# Patient Record
Sex: Female | Born: 1945
Health system: Southern US, Community
[De-identification: ages and names within clinical notes are randomized; demographics above are authoritative.]

## PROBLEM LIST (undated history)

## (undated) DIAGNOSIS — N3281 Overactive bladder: Secondary | ICD-10-CM

## (undated) DIAGNOSIS — I10 Essential (primary) hypertension: Secondary | ICD-10-CM

## (undated) DIAGNOSIS — E039 Hypothyroidism, unspecified: Secondary | ICD-10-CM

## (undated) DIAGNOSIS — D649 Anemia, unspecified: Secondary | ICD-10-CM

## (undated) DIAGNOSIS — F32A Depression, unspecified: Secondary | ICD-10-CM

## (undated) DIAGNOSIS — Z992 Dependence on renal dialysis: Secondary | ICD-10-CM

## (undated) DIAGNOSIS — C801 Malignant (primary) neoplasm, unspecified: Secondary | ICD-10-CM

## (undated) DIAGNOSIS — Z87898 Personal history of other specified conditions: Secondary | ICD-10-CM

## (undated) DIAGNOSIS — N189 Chronic kidney disease, unspecified: Secondary | ICD-10-CM

## (undated) DIAGNOSIS — E7800 Pure hypercholesterolemia, unspecified: Secondary | ICD-10-CM

## (undated) DIAGNOSIS — F329 Major depressive disorder, single episode, unspecified: Secondary | ICD-10-CM

## (undated) DIAGNOSIS — I6930 Unspecified sequelae of cerebral infarction: Secondary | ICD-10-CM

## (undated) DIAGNOSIS — N186 End stage renal disease: Secondary | ICD-10-CM

## (undated) DIAGNOSIS — K219 Gastro-esophageal reflux disease without esophagitis: Secondary | ICD-10-CM

## (undated) DIAGNOSIS — G8190 Hemiplegia, unspecified affecting unspecified side: Secondary | ICD-10-CM

## (undated) DIAGNOSIS — I639 Cerebral infarction, unspecified: Secondary | ICD-10-CM

## (undated) DIAGNOSIS — H409 Unspecified glaucoma: Secondary | ICD-10-CM

## (undated) DIAGNOSIS — E119 Type 2 diabetes mellitus without complications: Secondary | ICD-10-CM

## (undated) HISTORY — PX: PICC LINE INSERTION: CATH118290

## (undated) HISTORY — DX: Chronic kidney disease, unspecified: N18.9

## (undated) HISTORY — PX: BREAST LUMPECTOMY: SHX2

## (undated) HISTORY — PX: CATARACT EXTRACTION W/ INTRAOCULAR LENS IMPLANT: SHX1309

## (undated) HISTORY — PX: ABDOMINAL HYSTERECTOMY: SHX81

---
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
## 1990-12-06 DIAGNOSIS — I639 Cerebral infarction, unspecified: Secondary | ICD-10-CM

## 1990-12-06 HISTORY — DX: Cerebral infarction, unspecified: I63.9

## 1996-12-06 HISTORY — PX: CEREBRAL ANEURYSM REPAIR: SHX164

## 2012-12-06 HISTORY — PX: BREAST EXCISIONAL BIOPSY: SUR124

## 2015-06-27 IMAGING — US US EXTREM LOW VENOUS*L*
1 series · 13 of 24 positions shown · non-contrast
Comparison: None.

CLINICAL DATA: Chronic left lower extremity edema.



[Series 1: us extrem low venous*left* · 0.08mm/px · 13 of 33 slices shown]
[im 1/33]
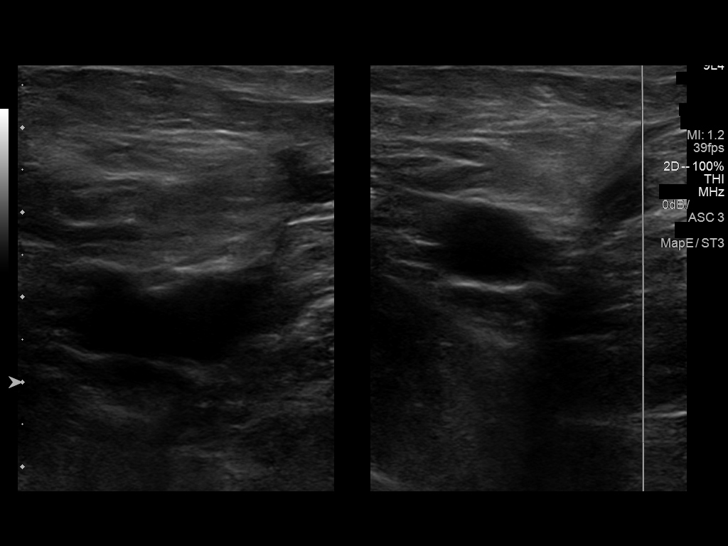
[im 3/33]
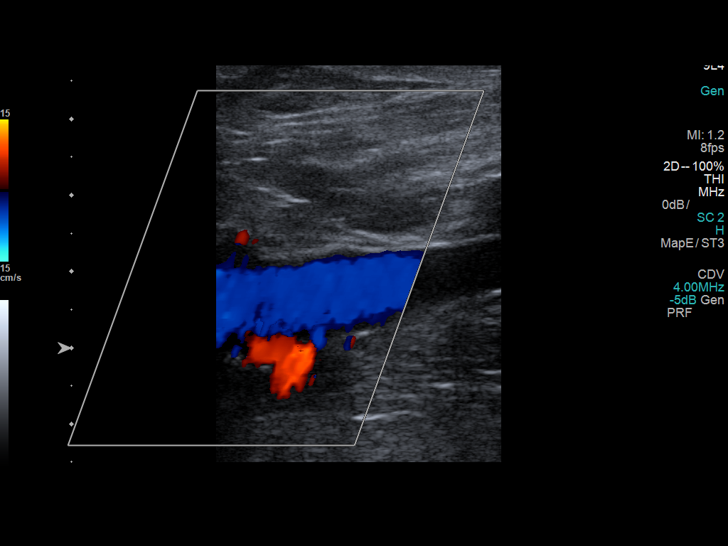
[im 6/33]
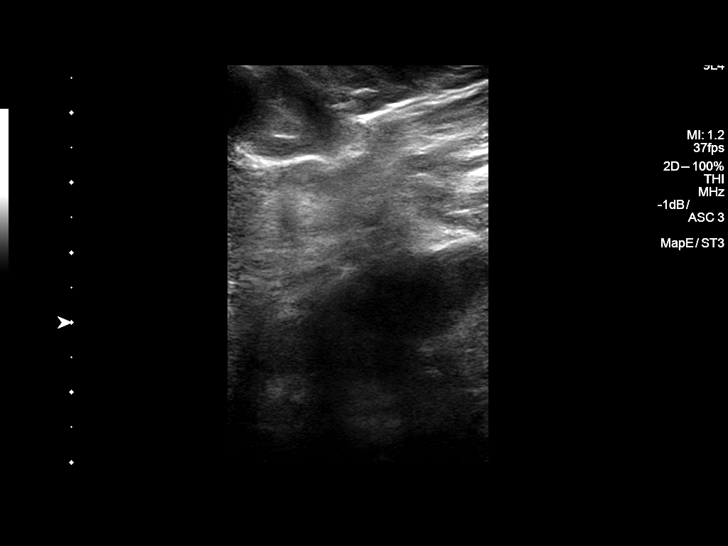
[im 9/33]
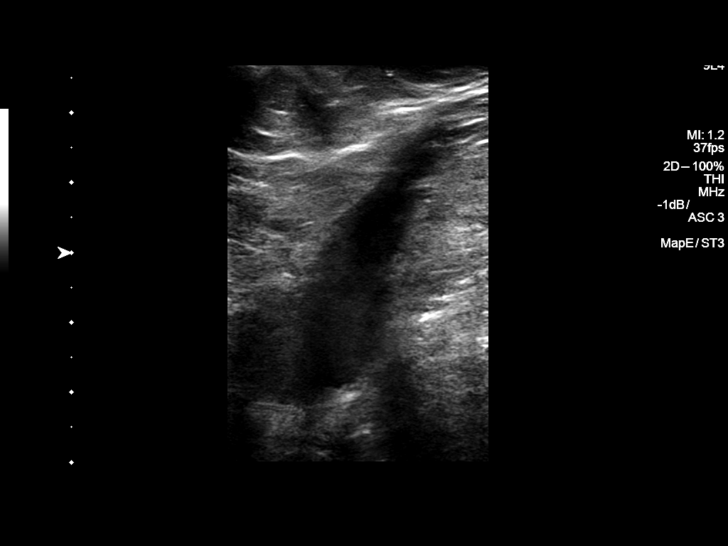
[im 12/33]
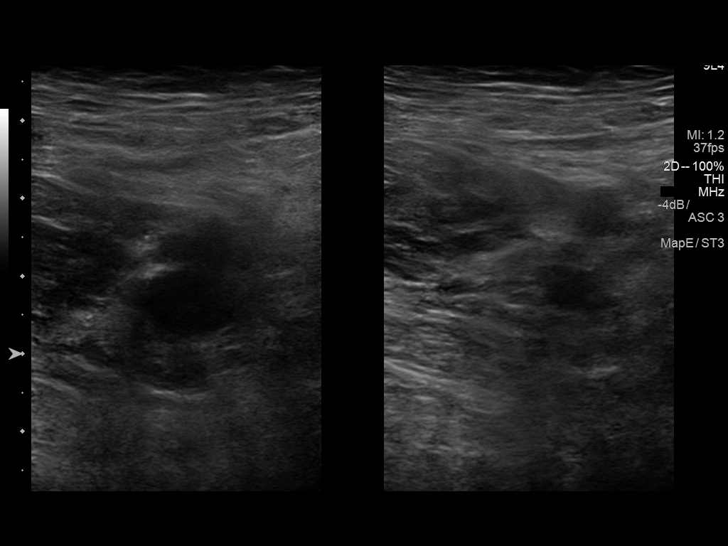
[im 14/33]
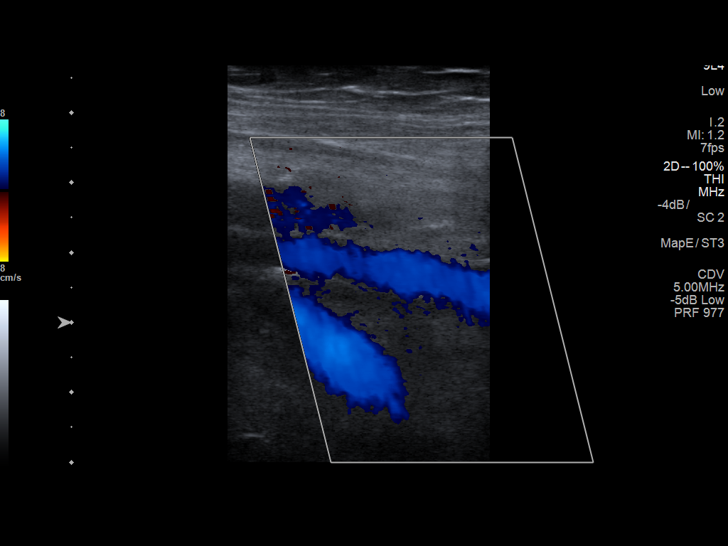
[im 17/33]
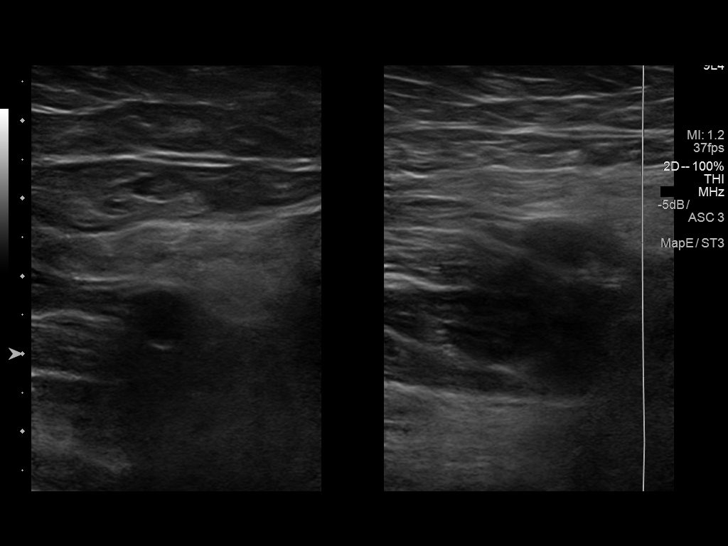
[im 19/33]
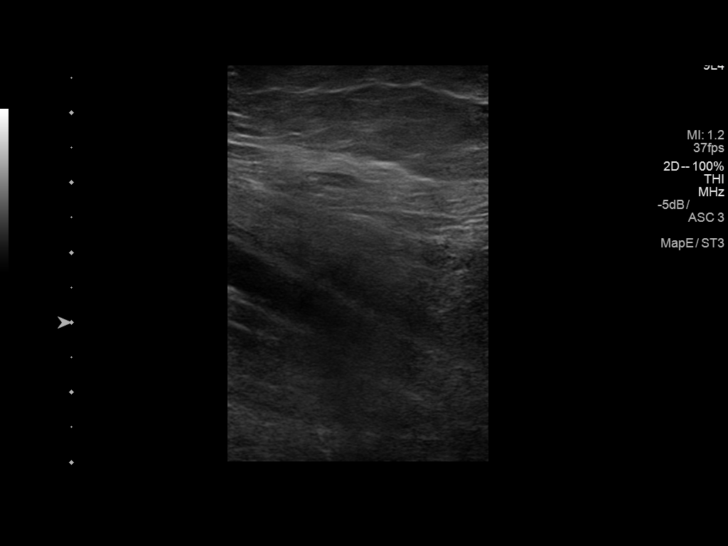
[im 21/33]
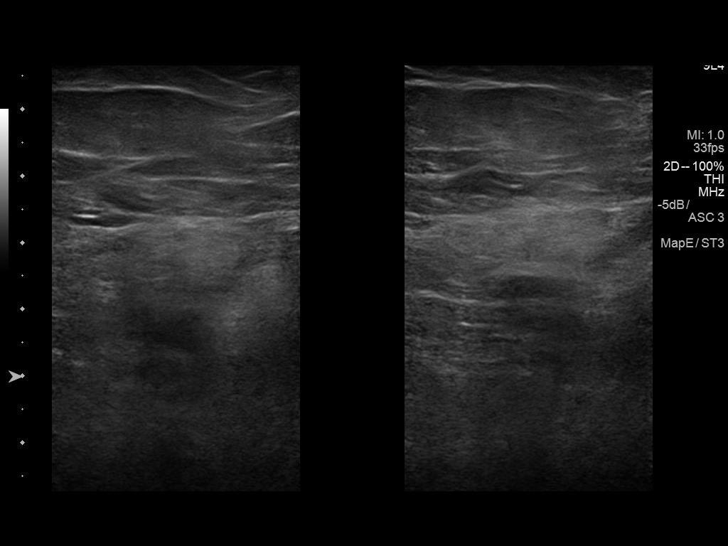
[im 24/33]
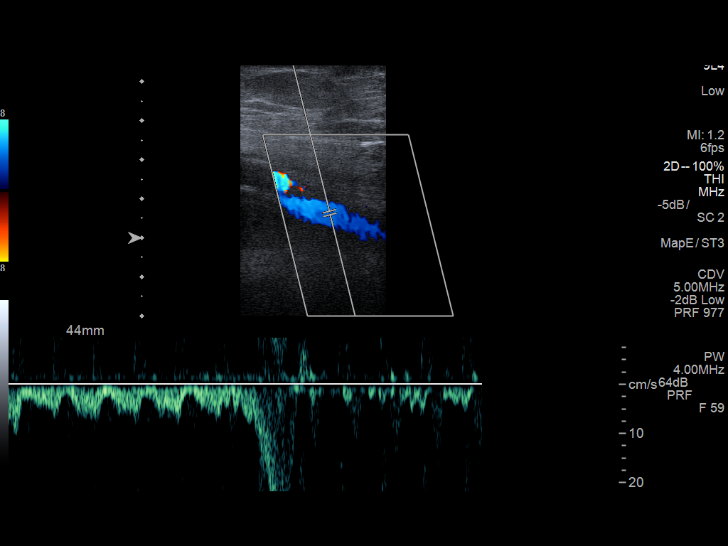
[im 27/33]
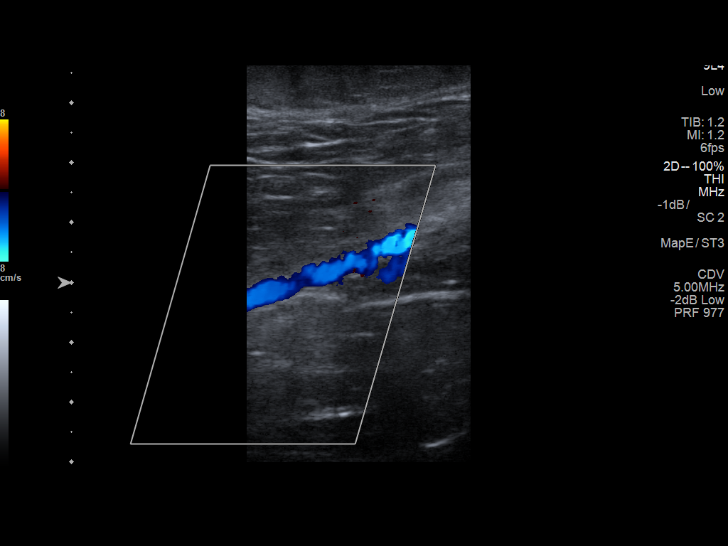
[im 30/33]
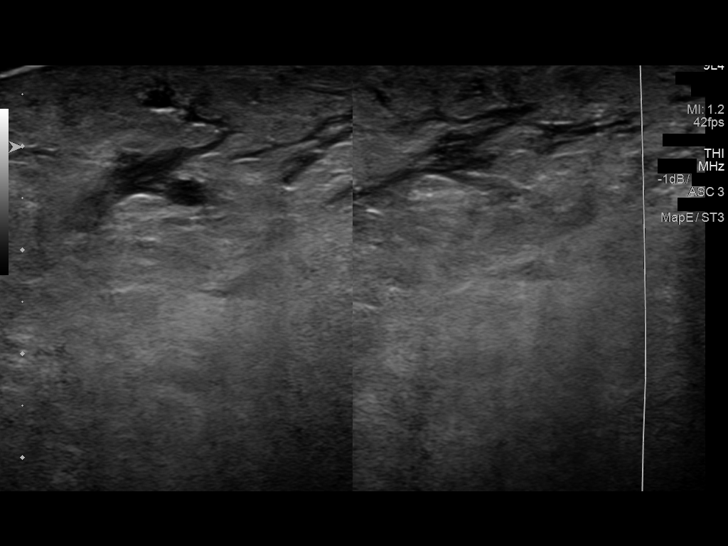
[im 33/33]
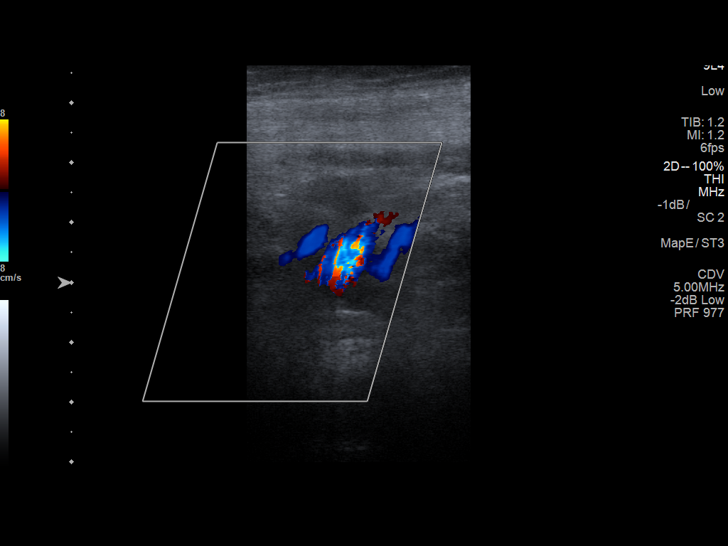

[13 of 24 positions shown; findings below may reference images not displayed]

FINDINGS: Contralateral Common Femoral Vein: Respiratory phasicity is normal
and symmetric with the symptomatic side. No evidence of thrombus.
Normal compressibility.

Common Femoral Vein: No evidence of thrombus. Normal
compressibility, respiratory phasicity and response to augmentation.

Saphenofemoral Junction: No evidence of thrombus. Normal
compressibility and flow on color Doppler imaging.

Profunda Femoral Vein: No evidence of thrombus. Normal
compressibility and flow on color Doppler imaging.

Femoral Vein: No evidence of thrombus. Normal compressibility,
respiratory phasicity and response to augmentation.

Popliteal Vein: No evidence of thrombus. Normal compressibility,
respiratory phasicity and response to augmentation.

Calf Veins: No evidence of thrombus. Normal compressibility and flow
on color Doppler imaging.

Superficial Great Saphenous Vein: No evidence of thrombus. Normal
compressibility and flow on color Doppler imaging.

Venous Reflux:  None.

Other Findings:  None.
IMPRESSION: No evidence of deep venous thrombosis.

## 2015-11-11 IMAGING — US US THYROID
1 series · 12 of 25 positions shown · non-contrast
Comparison: CT scan of the head and neck [DATE]

CLINICAL DATA: Incidental on CT. 70-year-old female with thyroid
nodules noted on recent prior CT scan

EXAM:
THYROID ULTRASOUND
TECHNIQUE: Ultrasound examination of the thyroid gland and adjacent soft
tissues was performed.

[Series 1: us thyroid · 0.06mm/px · 12 of 61 slices shown]
[im 3/61]
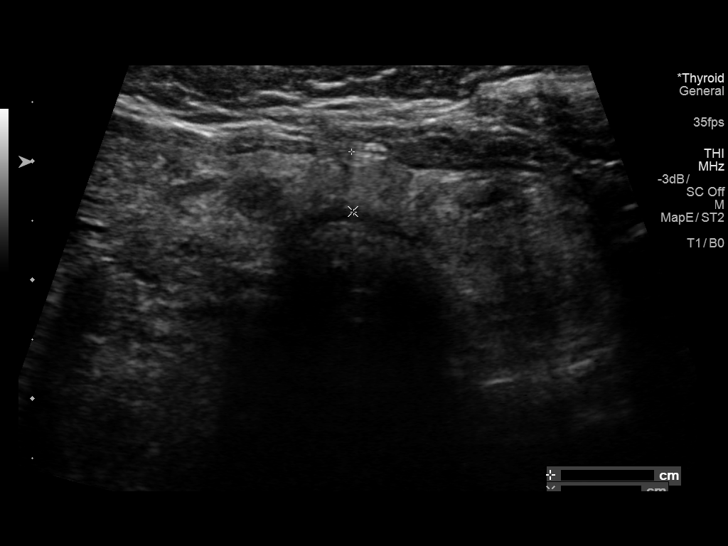
[im 8/61]
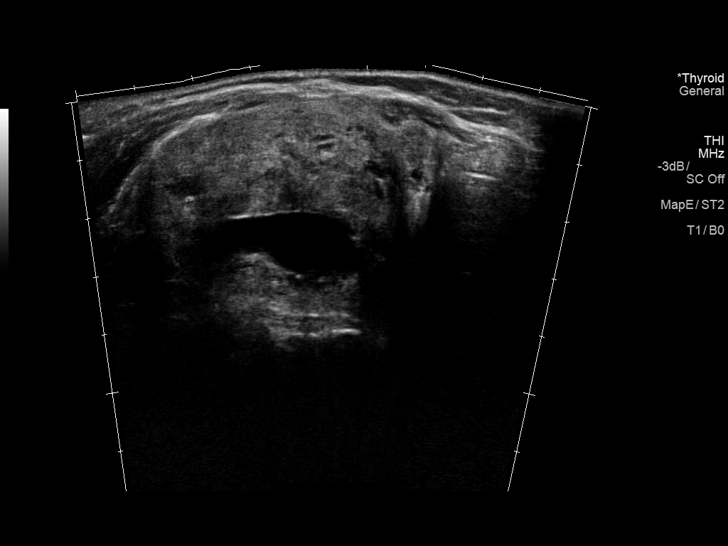
[im 13/61]
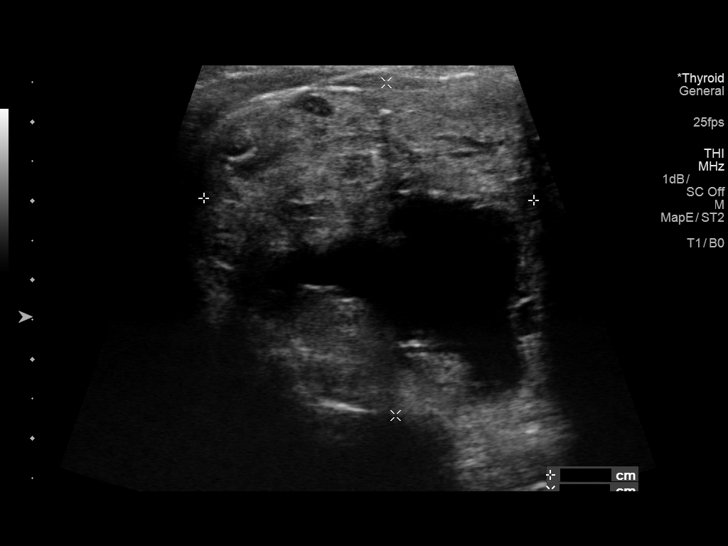
[im 18/61]
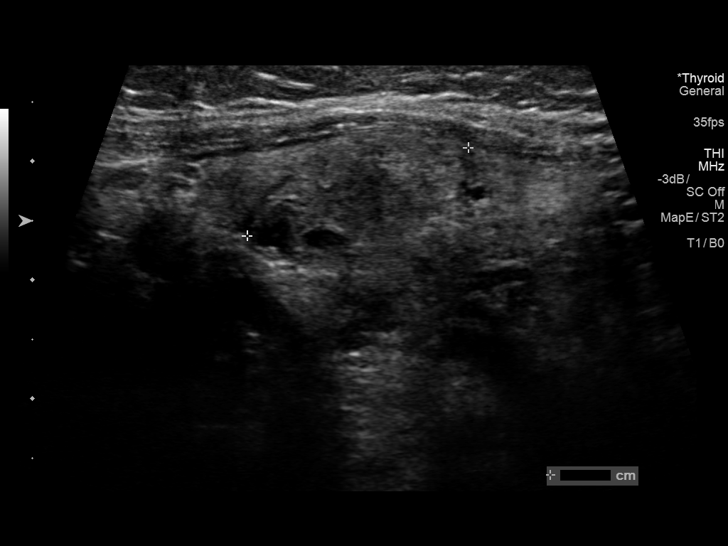
[im 23/61]
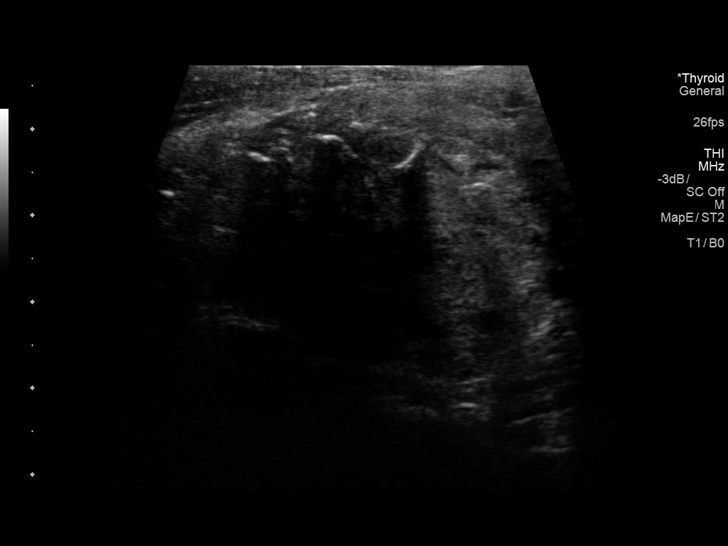
[im 28/61]
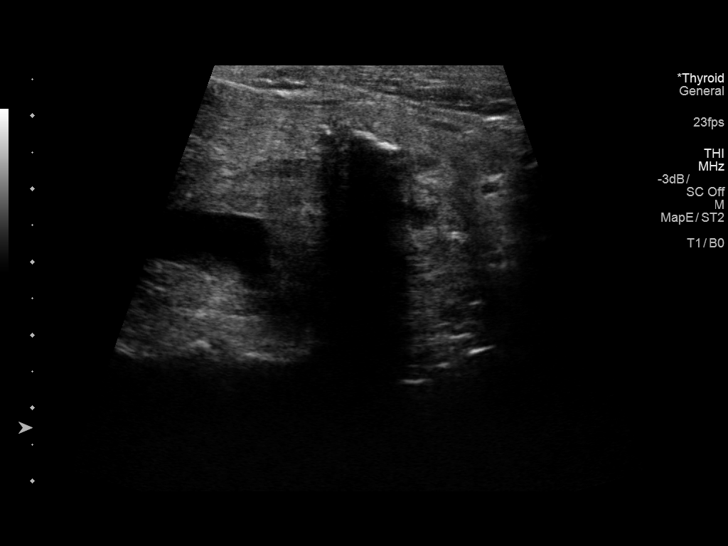
[im 33/61]
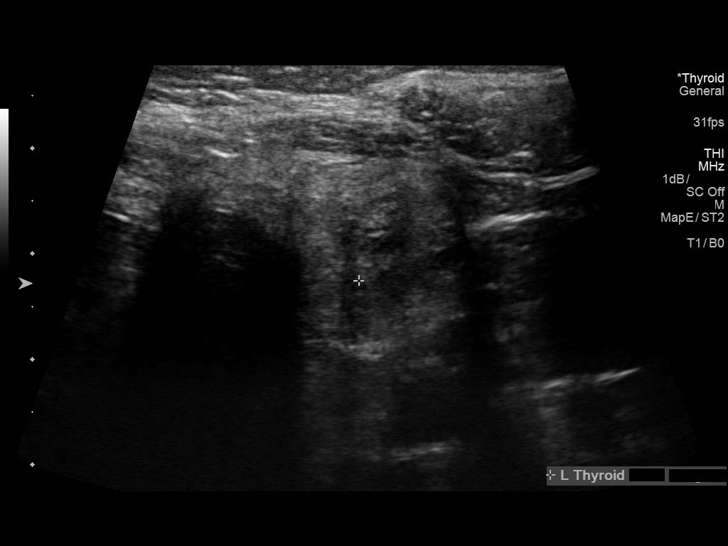
[im 38/61]
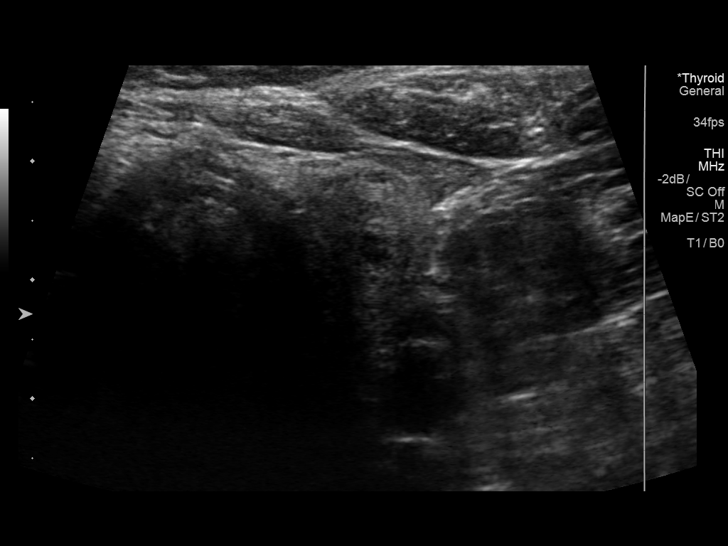
[im 43/61]
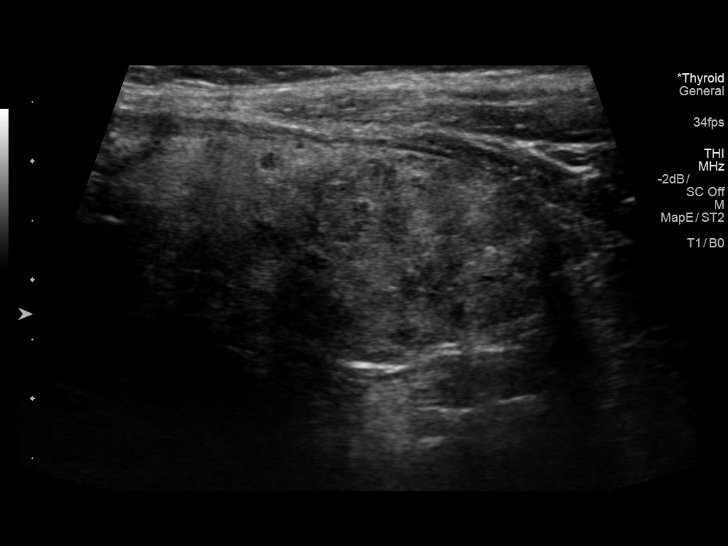
[im 48/61]
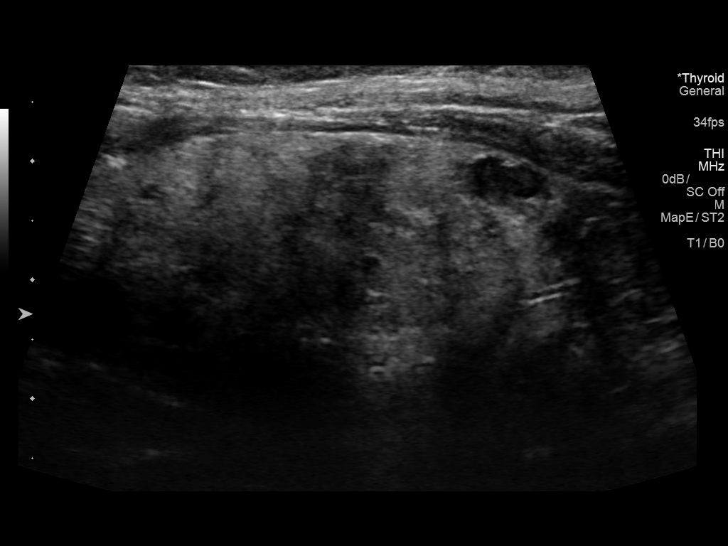
[im 53/61]
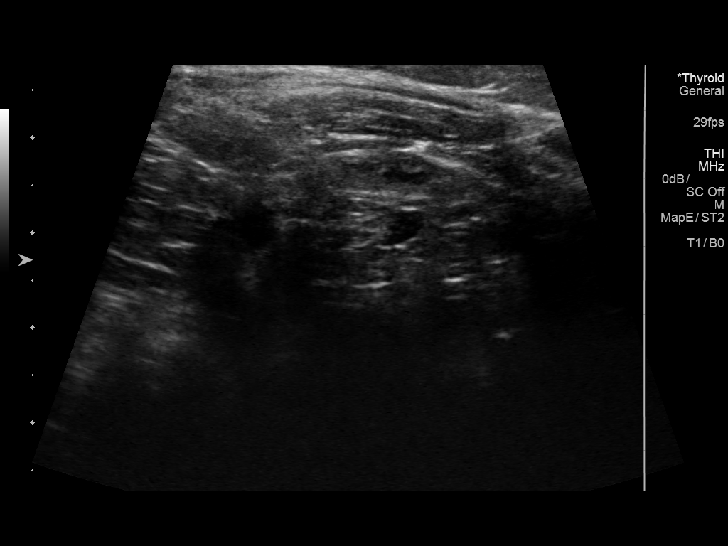
[im 58/61]
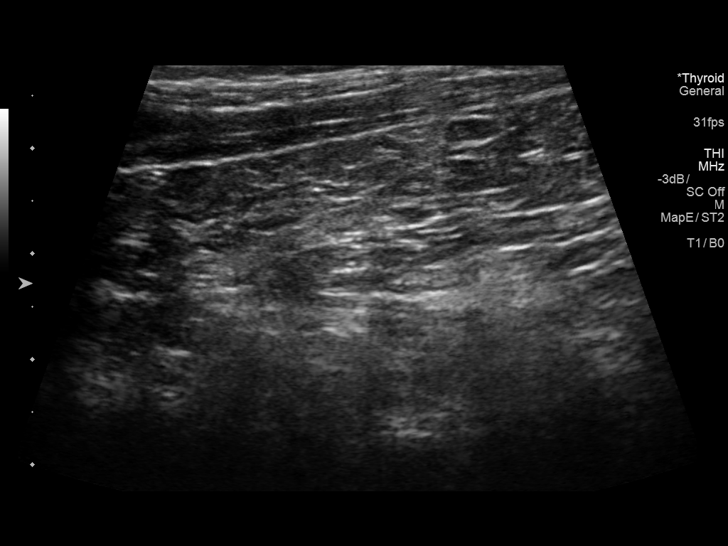

[12 of 25 positions shown; findings below may reference images not displayed]

FINDINGS: Parenchymal Echotexture: Markedly heterogenous

Isthmus: 0.5 cm

Right lobe: 7.3 x 4.1 x 5.0 cm

Left lobe: 4.2 x 1.8 x 1.7 cm

_________________________________________________________

Estimated total number of nodules >/= 1 cm: 3

Number of spongiform nodules >/=  2 cm not described below (TR1): 0

Number of mixed cystic and solid nodules >/= 1.5 cm not described
below (TR2): 0

_________________________________________________________

Nodule # 1:

Location: Right; Mid

Maximum size: 4.4 cm; Other 2 dimensions: 4.2 x 4.2 cm

Composition: solid/almost completely solid (2)

Echogenicity: isoechoic (1)

Shape: not taller-than-wide (0)

Margins: ill-defined (0)

Echogenic foci: macrocalcifications (1)

ACR TI-RADS total points: 4.

ACR TI-RADS risk category: TR4 (4-6 points).

ACR TI-RADS recommendations:

**Given size (>/= 1.5 cm) and appearance, fine needle aspiration of
this moderately suspicious nodule should be considered based on
TI-RADS criteria.

_________________________________________________________

Nodule # 2:

Location: Right; Mid

Maximum size: 2.0 cm; Other 2 dimensions: 1.7 x 0.8 cm

Composition: solid/almost completely solid (2)

Echogenicity: isoechoic (1)

Shape: not taller-than-wide (0)

Margins: ill-defined (0)

Echogenic foci: none (0)

ACR TI-RADS total points: 3.

ACR TI-RADS risk category: TR3 (3 points).

ACR TI-RADS recommendations:

*Given size (>/= 1.5 - 2.4 cm) and appearance, a follow-up
ultrasound in 1 year should be considered based on TI-RADS criteria.

_________________________________________________________

Nodule # 3:

Location: Left; Mid

Maximum size: 2.1 cm; Other 2 dimensions: 1.6 x 1.4 cm

Composition: solid/almost completely solid (2)

Echogenicity: isoechoic (1)

Shape: not taller-than-wide (0)

Margins: ill-defined (0)

Echogenic foci: none (0)

ACR TI-RADS total points: 3.

ACR TI-RADS risk category: TR3 (3 points).

ACR TI-RADS recommendations:

*Given size (>/= 1.5 - 2.4 cm) and appearance, a follow-up
ultrasound in 1 year should be considered based on TI-RADS criteria.

_________________________________________________________
IMPRESSION: 1. Diffusely heterogeneous and enlarged multinodular thyroid gland
most consistent with multinodular goiter.
2. The large nodule occupying the majority of the right gland
(nodule #1, TI-RADS category 4) meets consensus criteria for
fine-needle aspiration biopsy.
3. The remaining TI-RADS category 3 nodules (Nodules #2 and #3) meet
criteria for continued annual ultrasound follow-up until 5 year
stability has been confirmed.

The above is in keeping with the ACR TI-RADS recommendations - [HOSPITAL] [4B];[DATE].

## 2015-11-17 IMAGING — US US THYROID BIOPSY
1 series · 13 of 16 positions shown · non-contrast
Comparison: Thyroid ultrasound [DATE]

MEDICATIONS:
None

COMPLICATIONS:
None immediate.

INDICATION: Indeterminate thyroid nodule. 4.4 cm mid right lobe nodule meeting
TI-RADS criteria for fine-needle aspiration.

EXAM:
ULTRASOUND GUIDED FINE NEEDLE ASPIRATION OF INDETERMINATE THYROID
NODULE
TECHNIQUE: Informed written consent was obtained from the patient after a
discussion of the risks, benefits and alternatives to treatment.
Questions regarding the procedure were encouraged and answered. A
timeout was performed prior to the initiation of the procedure.

[Series 1: us thyroid biopsy · 0.09mm/px · 16 acquisitions, 13 frames shown]
[im 1/16]
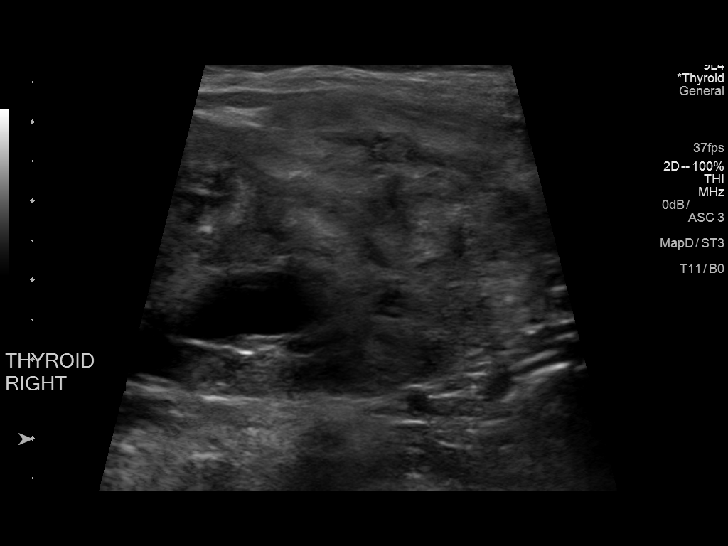
[im 2/16]
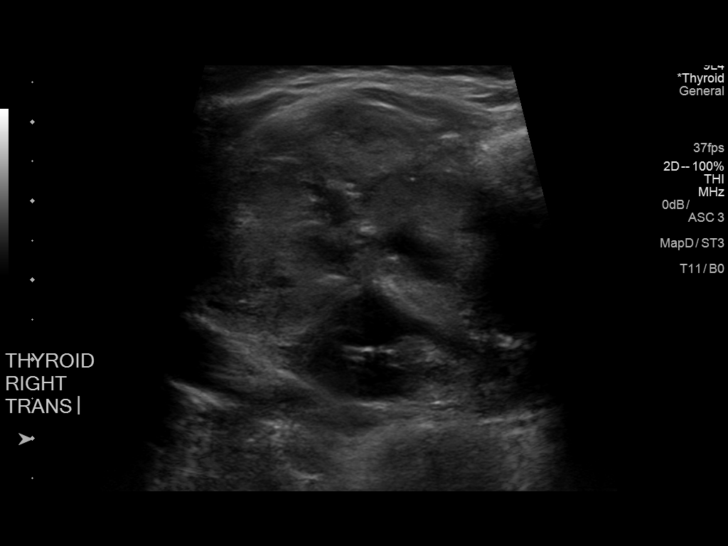
[im 4/16]
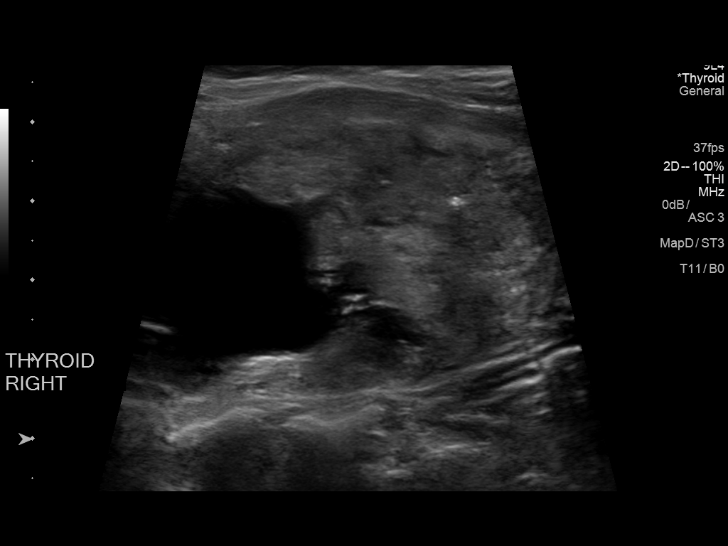
[im 5/16]
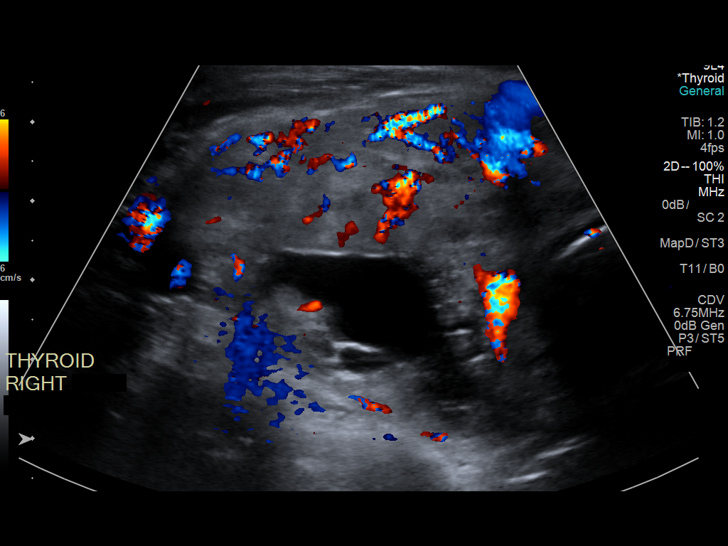
[im 6/16]
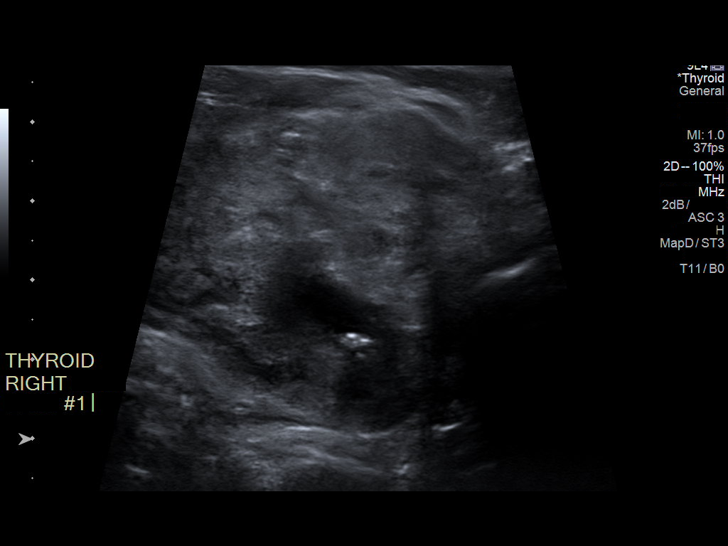
[im 7/16]
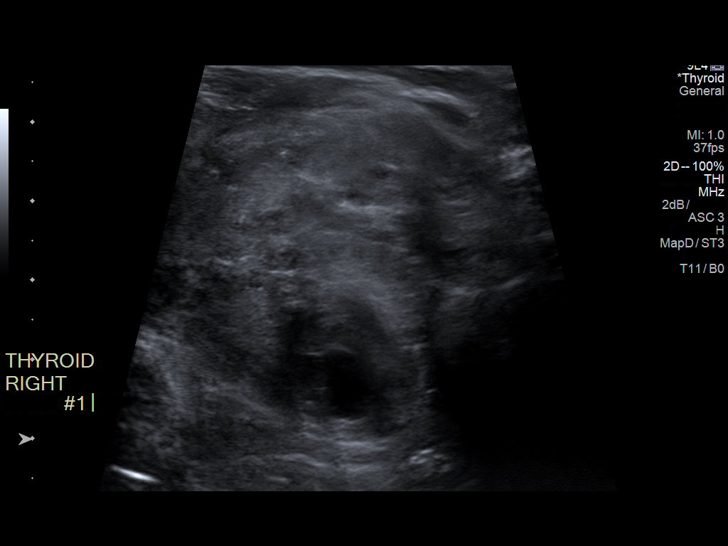
[im 9/16]
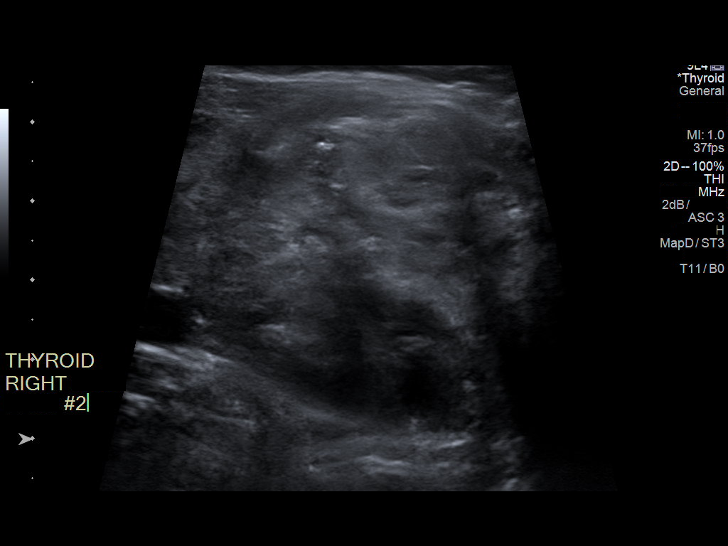
[im 10/16]
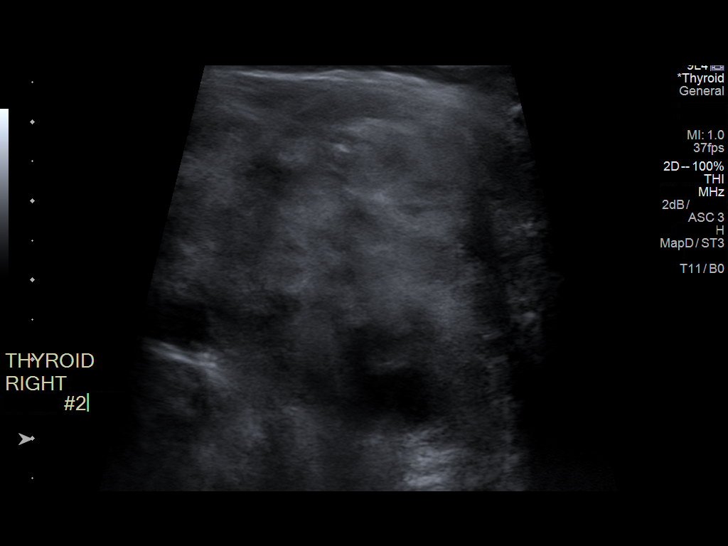
[im 11/16]
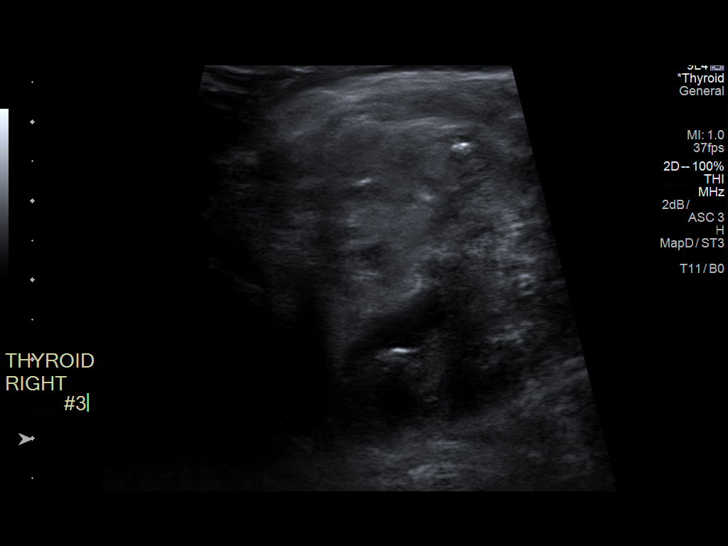
[im 12/16]
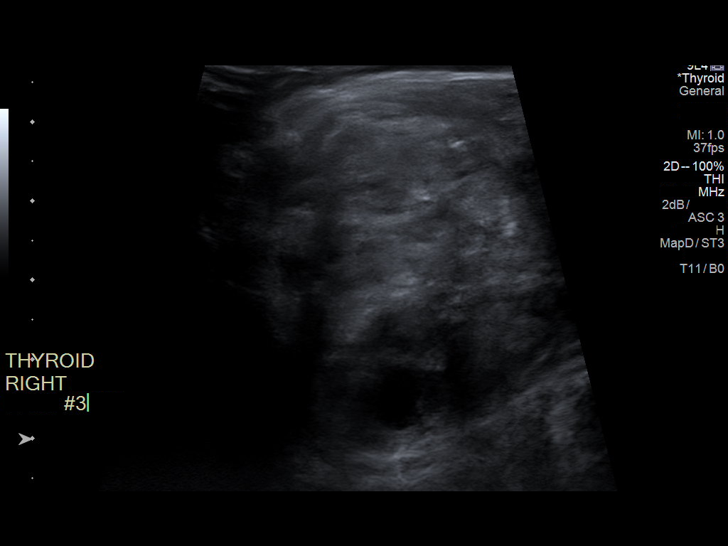
[im 13/16]
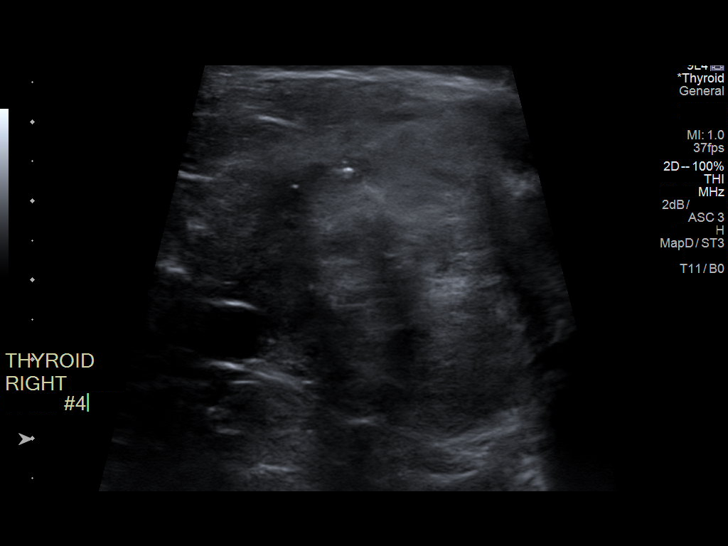
[im 15/16]
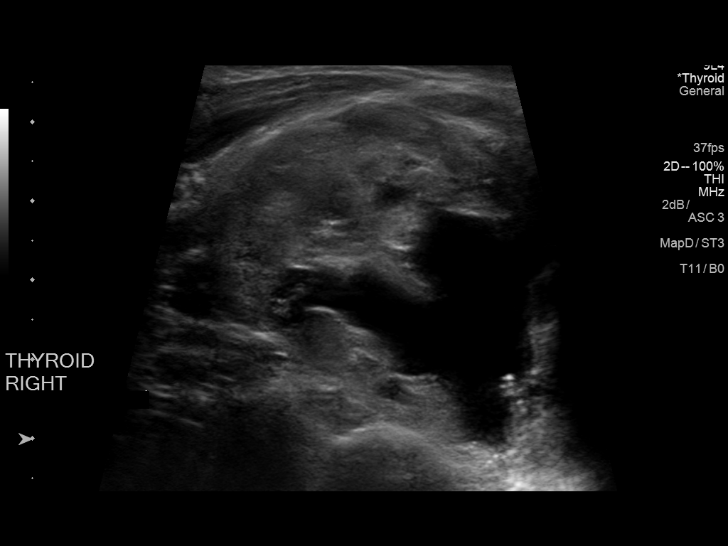
[im 16/16]
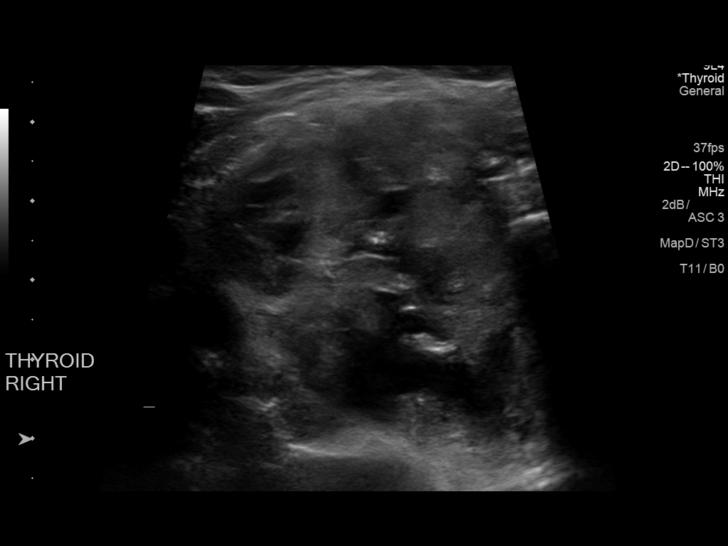

[13 of 16 positions shown; findings below may reference images not displayed]

Pre-procedural ultrasound scanning demonstrated unchanged size and
appearance of the indeterminate nodule within the right lobe.

The procedure was planned. The neck was prepped in the usual sterile
fashion, and a sterile drape was applied covering the operative
field. A timeout was performed prior to the initiation of the
procedure. Local anesthesia was provided with 1% lidocaine.

Under direct ultrasound guidance, 4 FNA biopsies were performed with
25 gauge needles. Multiple ultrasound images were saved for
procedural documentation purposes. The samples were prepared and
submitted to pathology.

Limited post procedural scanning was negative for hematoma or
additional complication. Dressings were placed. The patient
tolerated the above procedures procedure well without immediate
postprocedural complication.
FINDINGS: Nodule reference number based on prior diagnostic ultrasound: 1

Maximum size: 4.4 cm

Location: Right; Mid

ACR TI-RADS risk category: TR4 (4-6 points)

Reason for biopsy: meets ACR TI-RADS criteria

Ultrasound imaging confirms appropriate placement of the needles
within the thyroid nodule.
IMPRESSION: Technically successful ultrasound guided fine needle aspiration of
4.4 cm mid right thyroid nodule.

## 2015-12-07 DIAGNOSIS — C801 Malignant (primary) neoplasm, unspecified: Secondary | ICD-10-CM

## 2015-12-07 HISTORY — DX: Malignant (primary) neoplasm, unspecified: C80.1

## 2016-06-26 ENCOUNTER — Emergency Department (HOSPITAL_COMMUNITY)
Admission: EM | Admit: 2016-06-26 | Discharge: 2016-06-26 | Disposition: A | Discharge status: Home / Self Care | Payer: Medicare (Managed Care) | Attending: Emergency Medicine | Admitting: Emergency Medicine

## 2016-06-26 ENCOUNTER — Emergency Department (HOSPITAL_COMMUNITY): Payer: Medicare (Managed Care)

## 2016-06-26 ENCOUNTER — Encounter (HOSPITAL_COMMUNITY): Payer: Self-pay | Admitting: *Deleted

## 2016-06-26 DIAGNOSIS — R600 Localized edema: Secondary | ICD-10-CM | POA: Diagnosis not present

## 2016-06-26 DIAGNOSIS — I10 Essential (primary) hypertension: Secondary | ICD-10-CM | POA: Insufficient documentation

## 2016-06-26 DIAGNOSIS — Z8673 Personal history of transient ischemic attack (TIA), and cerebral infarction without residual deficits: Secondary | ICD-10-CM | POA: Insufficient documentation

## 2016-06-26 DIAGNOSIS — Z853 Personal history of malignant neoplasm of breast: Secondary | ICD-10-CM | POA: Diagnosis not present

## 2016-06-26 DIAGNOSIS — E876 Hypokalemia: Secondary | ICD-10-CM | POA: Diagnosis not present

## 2016-06-26 DIAGNOSIS — M7989 Other specified soft tissue disorders: Secondary | ICD-10-CM | POA: Diagnosis present

## 2016-06-26 HISTORY — DX: Pure hypercholesterolemia, unspecified: E78.00

## 2016-06-26 HISTORY — DX: Essential (primary) hypertension: I10

## 2016-06-26 HISTORY — DX: Cerebral infarction, unspecified: I63.9

## 2016-06-26 LAB — BASIC METABOLIC PANEL
Anion gap: 5 (ref 5–15)
BUN: 18 mg/dL (ref 6–20)
CALCIUM: 8.6 mg/dL — AB (ref 8.9–10.3)
CO2: 28 mmol/L (ref 22–32)
CREATININE: 1.06 mg/dL — AB (ref 0.44–1.00)
Chloride: 107 mmol/L (ref 101–111)
GFR calc Af Amer: 60 mL/min — ABNORMAL LOW (ref >60)
GFR, EST NON AFRICAN AMERICAN: 52 mL/min — AB (ref >60)
GLUCOSE: 89 mg/dL (ref 65–99)
Potassium: 3.3 mmol/L — ABNORMAL LOW (ref 3.5–5.1)
Sodium: 140 mmol/L (ref 135–145)

## 2016-06-26 LAB — CBC
HEMATOCRIT: 37.8 % (ref 36.0–46.0)
Hemoglobin: 12.2 g/dL (ref 12.0–15.0)
MCH: 31 pg (ref 26.0–34.0)
MCHC: 32.3 g/dL (ref 30.0–36.0)
MCV: 96.2 fL (ref 78.0–100.0)
PLATELETS: 230 10*3/uL (ref 150–400)
RBC: 3.93 MIL/uL (ref 3.87–5.11)
RDW: 12.8 % (ref 11.5–15.5)
WBC: 6.5 10*3/uL (ref 4.0–10.5)

## 2016-06-26 IMAGING — DX DG CHEST 2V
2 series · 2 of 2 positions shown · non-contrast
Comparison: None.

CLINICAL DATA: Swelling of the left arm and leg.

EXAM:
CHEST  2 VIEW

[chest lat]
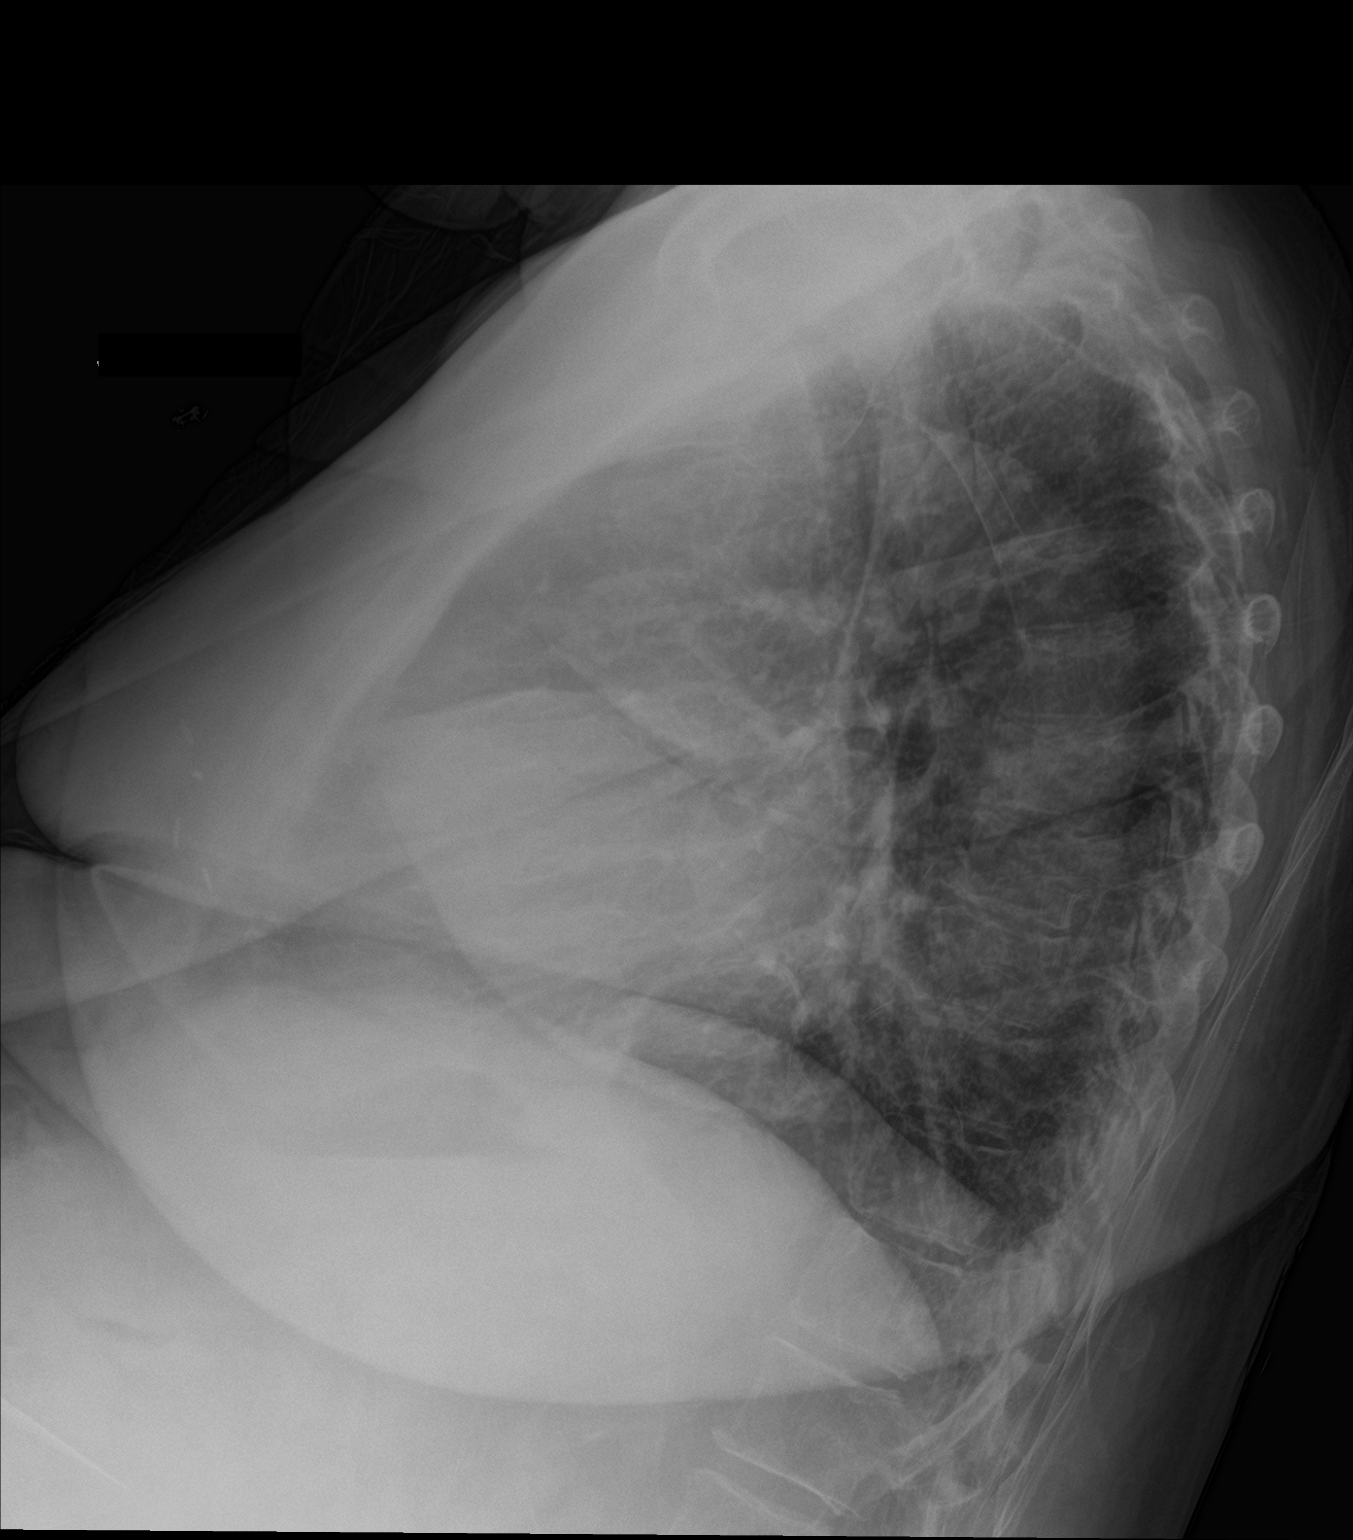

[chest ap]
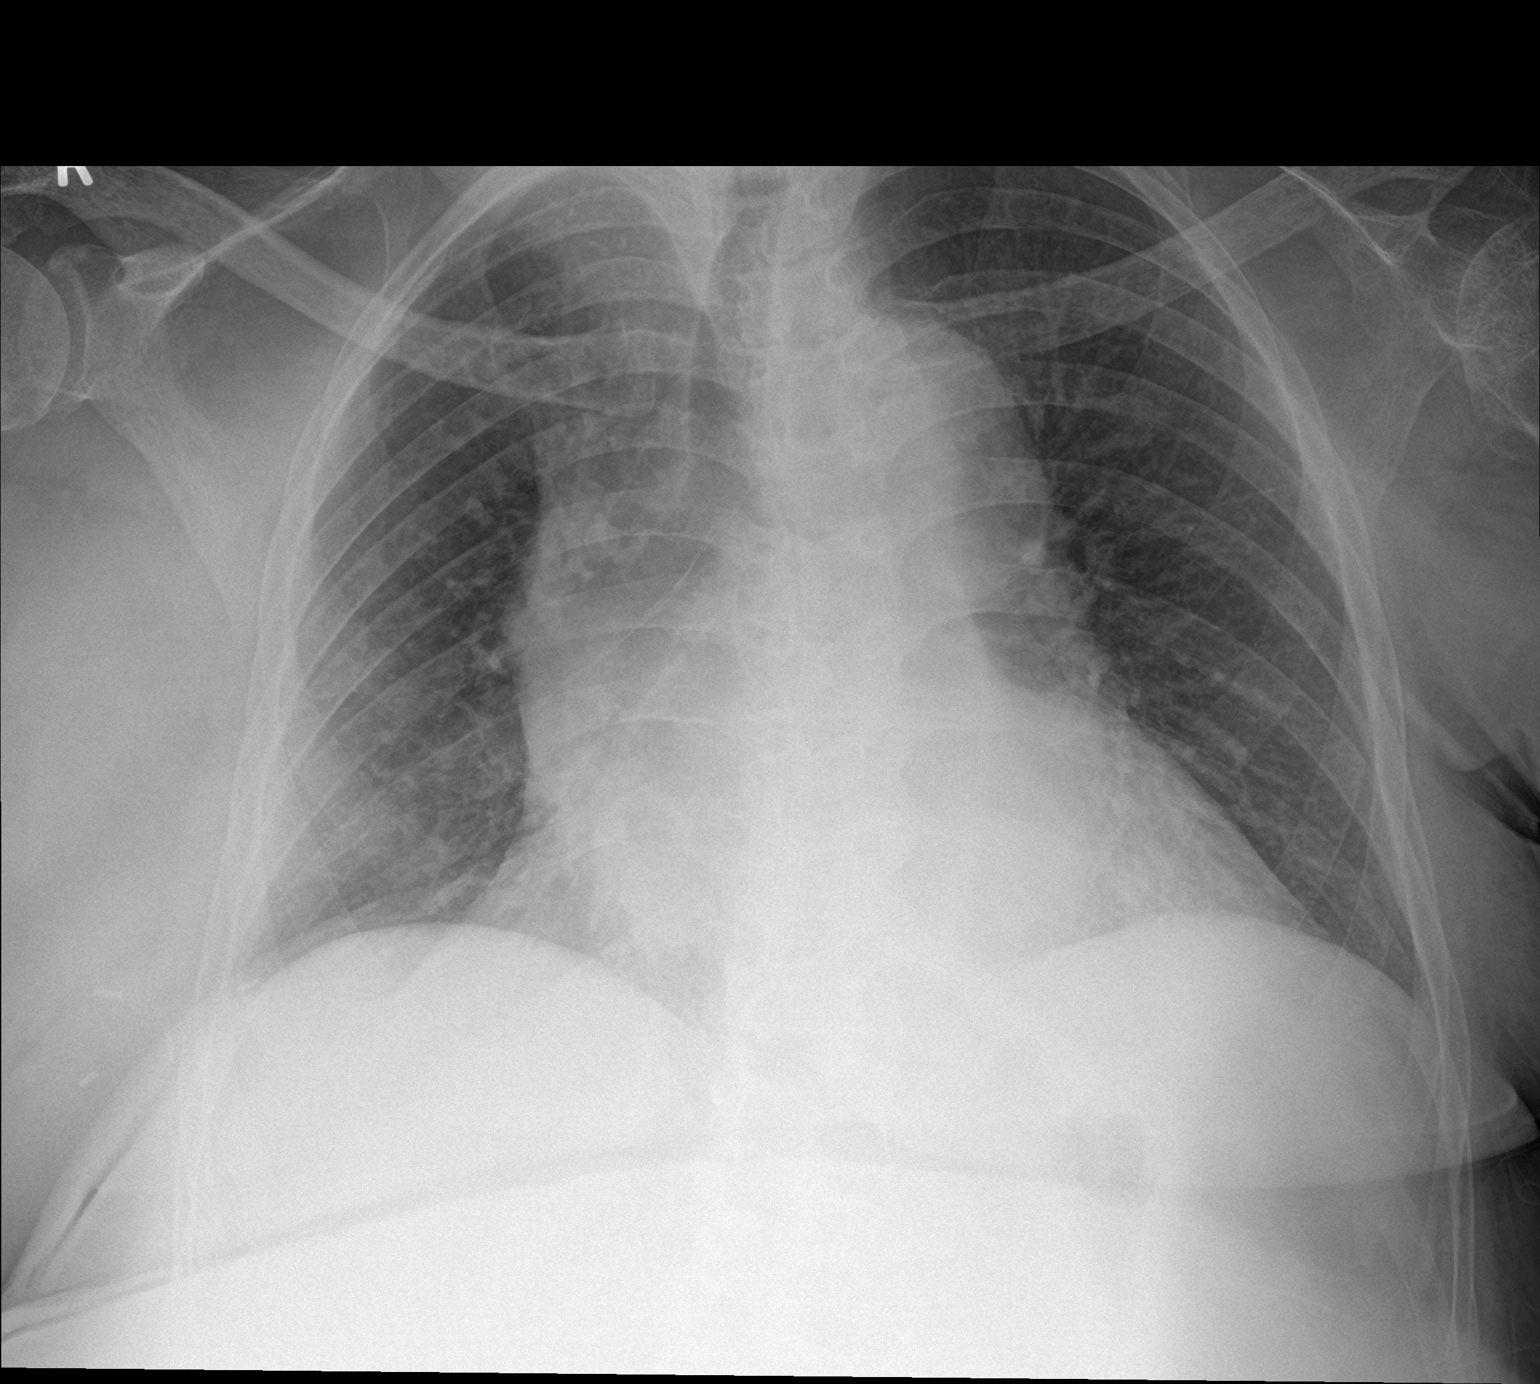

[2 of 2 positions shown; findings below may reference images not displayed]

FINDINGS: Cardiomegaly and aortic tortuosity. Interstitial crowding at the
bases. No convincing pneumonia or edema. No effusion or
pneumothorax. Postoperative right breast.
IMPRESSION: Cardiomegaly without failure.

## 2016-06-26 MED ORDER — POTASSIUM CHLORIDE CRYS ER 20 MEQ PO TBCR: 20.0000 meq | EXTENDED_RELEASE_TABLET | Freq: Every day | ORAL | Status: DC

## 2016-06-26 MED ORDER — POTASSIUM CHLORIDE CRYS ER 20 MEQ PO TBCR
20.0000 meq | EXTENDED_RELEASE_TABLET | Freq: Once | ORAL | Status: AC
  Administered 2016-06-26: 20 meq via ORAL
  Filled 2016-06-26: qty 1

## 2016-06-26 MED ORDER — FUROSEMIDE 10 MG/ML IJ SOLN
40.0000 mg | Freq: Once | INTRAMUSCULAR | Status: AC
  Administered 2016-06-26: 40 mg via INTRAVENOUS
  Filled 2016-06-26: qty 4

## 2016-06-26 NOTE — ED Provider Notes (Signed)
CSN: [id number]     Arrival date & time 06/26/16  2111 History  By signing my name below, I, Kirsten Mcmahon, attest that this documentation has been prepared under the direction and in the presence of Pattricia Boss, MD. Electronically Signed: Gwenlyn Mcmahon, ED Scribe. 06/26/2016. 9:50 PM.   Chief Complaint  Patient presents with  . Leg Swelling   The history is provided by the patient. No language interpreter was used.    HPI Comments: Kirsten Mcmahon is a 70 y.o. female with PMHx of Stroke and HTN who presents to the Emergency Department complaining of increased left sided swelling in her hand and legs onset 1 week.  Pt states she has had similar symptoms before in her left hand and left foot. Pt states swelling was from lack of movement of her left extremities due to stroke.  Pt states she has been taking her fluid medicine. Pt denies changes in her stroke symptoms. Pt reports she is on Lasix at home. Pt denies shortness of breath. Pt reports PCP as Dr. Marcello Moores in [place], [place]. Pt states she lives in her own home, but does not live alone.   Past Medical History  Diagnosis Date  . Stroke ([place])   . Hypertension   . Cancer ([place])   . Breast cancer ([place])   . Vertigo   . Seizures ([place])   . High cholesterol   . Brain aneurysm    Past Surgical History  Procedure Laterality Date  . Breast lumpectomy      right breast  . Brain surgery      due to aneurysm  . Abdominal surgery     No family history on file. Social History  Substance Use Topics  . Smoking status: Never Smoker   . Smokeless tobacco: None  . Alcohol Use: No   OB History    No data available     Review of Systems  Constitutional: Negative for fever.  Respiratory: Negative for shortness of breath.   Cardiovascular: Positive for leg swelling.  All other systems reviewed and are negative.   Allergies  Review of patient's allergies indicates no known allergies.  Home Medications   Prior to Admission medications    Not on File   BP 157/77 mmHg  Pulse 72  Temp(Src) 98 F (36.7 C) (Oral)  Resp 20  Ht 5\' 2"  (1.575 m)  Wt 156 lb (70.761 kg)  BMI 28.53 kg/m2  SpO2 100% Physical Exam  Constitutional: She is oriented to person, place, and time. She appears well-developed and well-nourished.  Morbidly obese  HENT:  Head: Normocephalic.  Nose: Nose normal.  Mouth/Throat: Oropharynx is clear and moist.  Eyes: Pupils are equal, round, and reactive to light.  Neck: Normal range of motion. Neck supple.  Cardiovascular: Normal rate.   Pulmonary/Chest: Effort normal.  Abdominal: Soft.  Musculoskeletal: She exhibits edema.  lue and lle edema-at baseline per patient  Neurological: She is alert and oriented to person, place, and time.  Skin: Skin is warm and dry.  Psychiatric: She has a normal mood and affect.  Nursing note and vitals reviewed.   ED Course  Procedures (including critical care time)  DIAGNOSTIC STUDIES: Oxygen Saturation is 100% on RA, normal by my interpretation.    COORDINATION OF CARE: 9:26 PM Discussed treatment plan with pt at bedside which includes DG Chest, CBC, and Basic Metabolic Panel and pt agreed to plan.  Labs Review Labs Reviewed  BASIC METABOLIC PANEL - Abnormal; Notable for the  following:    Potassium 3.3 (*)    Creatinine, Ser 1.06 (*)    Calcium 8.6 (*)    GFR calc non Af Amer 52 (*)    GFR calc Af Amer 60 (*)    All other components within normal limits  CBC    Imaging Review Dg Chest 2 View  06/26/2016  CLINICAL DATA:  Swelling of the left arm and leg. EXAM: CHEST  2 VIEW COMPARISON:  None. FINDINGS: Cardiomegaly and aortic tortuosity. Interstitial crowding at the bases. No convincing pneumonia or edema. No effusion or pneumothorax. Postoperative right breast. IMPRESSION: Cardiomegaly without failure. Electronically Signed   By: Monte Fantasia M.D.   On: 06/26/2016 22:01   I have personally reviewed and evaluated these images and lab results as part  of my medical decision-making.   EKG Interpretation None      MDM   Final diagnoses:  Edema of left lower extremity  Edema of upper extremity  Hypokalemia   70 y.o. Female with left hemiparesis and lue and lle edema likely secondary to some increased fluid and poor/no muscle pump.  Patient given dose of lasix here (she is on lasix) and advised regarding conservative therapy such as elevation and compression.   Pattricia Boss, MD 06/30/16 [phone]

## 2016-06-26 NOTE — ED Notes (Signed)
Pt c/o swelling to left arm and left leg that started a week ago, denies any injury, pt states that she has had this type of swelling in the past and was told " it was just fluid build up"

## 2016-06-26 NOTE — ED Notes (Signed)
Pt reports left side extremity swelling. Has been going on for about 1 week. Pt says she has been taking her fluid pill.

## 2016-06-26 NOTE — Discharge Instructions (Signed)
Please use compression sleeve for left arm and leg.  Elevate extremities when able.  Take double dose of lasix for next three days and recheck with your doctor this week.   Hypokalemia Hypokalemia means that the amount of potassium in the blood is lower than normal.Potassium is a chemical, called an electrolyte, that helps regulate the amount of fluid in the body. It also stimulates muscle contraction and helps nerves function properly.Most of the body's potassium is inside of cells, and only a very small amount is in the blood. Because the amount in the blood is so small, minor changes can be life-threatening. CAUSES  Antibiotics.  Diarrhea or vomiting.  Using laxatives too much, which can cause diarrhea.  Chronic kidney disease.  Water pills (diuretics).  Eating disorders (bulimia).  Low magnesium level.  Sweating a lot. SIGNS AND SYMPTOMS  Weakness.  Constipation.  Fatigue.  Muscle cramps.  Mental confusion.  Skipped heartbeats or irregular heartbeat (palpitations).  Tingling or numbness. DIAGNOSIS  Your health care provider can diagnose hypokalemia with blood tests. In addition to checking your potassium level, your health care provider may also check other lab tests. TREATMENT Hypokalemia can be treated with potassium supplements taken by mouth or adjustments in your current medicines. If your potassium level is very low, you may need to get potassium through a vein (IV) and be monitored in the hospital. A diet high in potassium is also helpful. Foods high in potassium are:  Nuts, such as peanuts and pistachios.  Seeds, such as sunflower seeds and pumpkin seeds.  Peas, lentils, and lima beans.  Whole grain and bran cereals and breads.  Fresh fruit and vegetables, such as apricots, avocado, bananas, cantaloupe, kiwi, oranges, tomatoes, asparagus, and potatoes.  Orange and tomato juices.  Red meats.  Fruit yogurt. HOME CARE INSTRUCTIONS  Take all  medicines as prescribed by your health care provider.  Maintain a healthy diet by including nutritious food, such as fruits, vegetables, nuts, whole grains, and lean meats.  If you are taking a laxative, be sure to follow the directions on the label. SEEK MEDICAL CARE IF:  Your weakness gets worse.  You feel your heart pounding or racing.  You are vomiting or having diarrhea.  You are diabetic and having trouble keeping your blood glucose in the normal range. SEEK IMMEDIATE MEDICAL CARE IF:  You have chest pain, shortness of breath, or dizziness.  You are vomiting or having diarrhea for more than 2 days.  You faint. MAKE SURE YOU:   Understand these instructions.  Will watch your condition.  Will get help right away if you are not doing well or get worse.   This information is not intended to replace advice given to you by your health care provider. Make sure you discuss any questions you have with your health care provider.   Document Released: 11/22/2005 Document Revised: 12/13/2014 Document Reviewed: 05/25/2013 Elsevier Interactive Patient Education [employer].

## 2016-06-26 NOTE — ED Notes (Signed)
Pt alert & oriented x4. Patient given discharge instructions, paperwork & prescription(s). Patient verbalized understanding. Pt left department in wheelchair escorted by staff. Pt left w/ no further questions. 

## 2016-07-14 ENCOUNTER — Encounter: Payer: Self-pay | Admitting: Family Medicine

## 2016-08-25 ENCOUNTER — Other Ambulatory Visit: Payer: Self-pay | Admitting: Nurse Practitioner

## 2016-08-25 ENCOUNTER — Ambulatory Visit
Admission: RE | Admit: 2016-08-25 | Discharge: 2016-08-25 | Disposition: A | Discharge status: Home / Self Care | Payer: Medicaid Other | Source: Ambulatory Visit | Attending: Nurse Practitioner | Admitting: Nurse Practitioner

## 2016-08-25 DIAGNOSIS — R140 Abdominal distension (gaseous): Secondary | ICD-10-CM

## 2016-08-25 IMAGING — CR DG ABDOMEN 1V
1 series · 1 of 1 positions shown · non-contrast
Comparison: None.

CLINICAL DATA: Abdominal pain and distention for few days.
Constipation for 1 week.

EXAM:
ABDOMEN - 1 VIEW

[t abdomen supine]
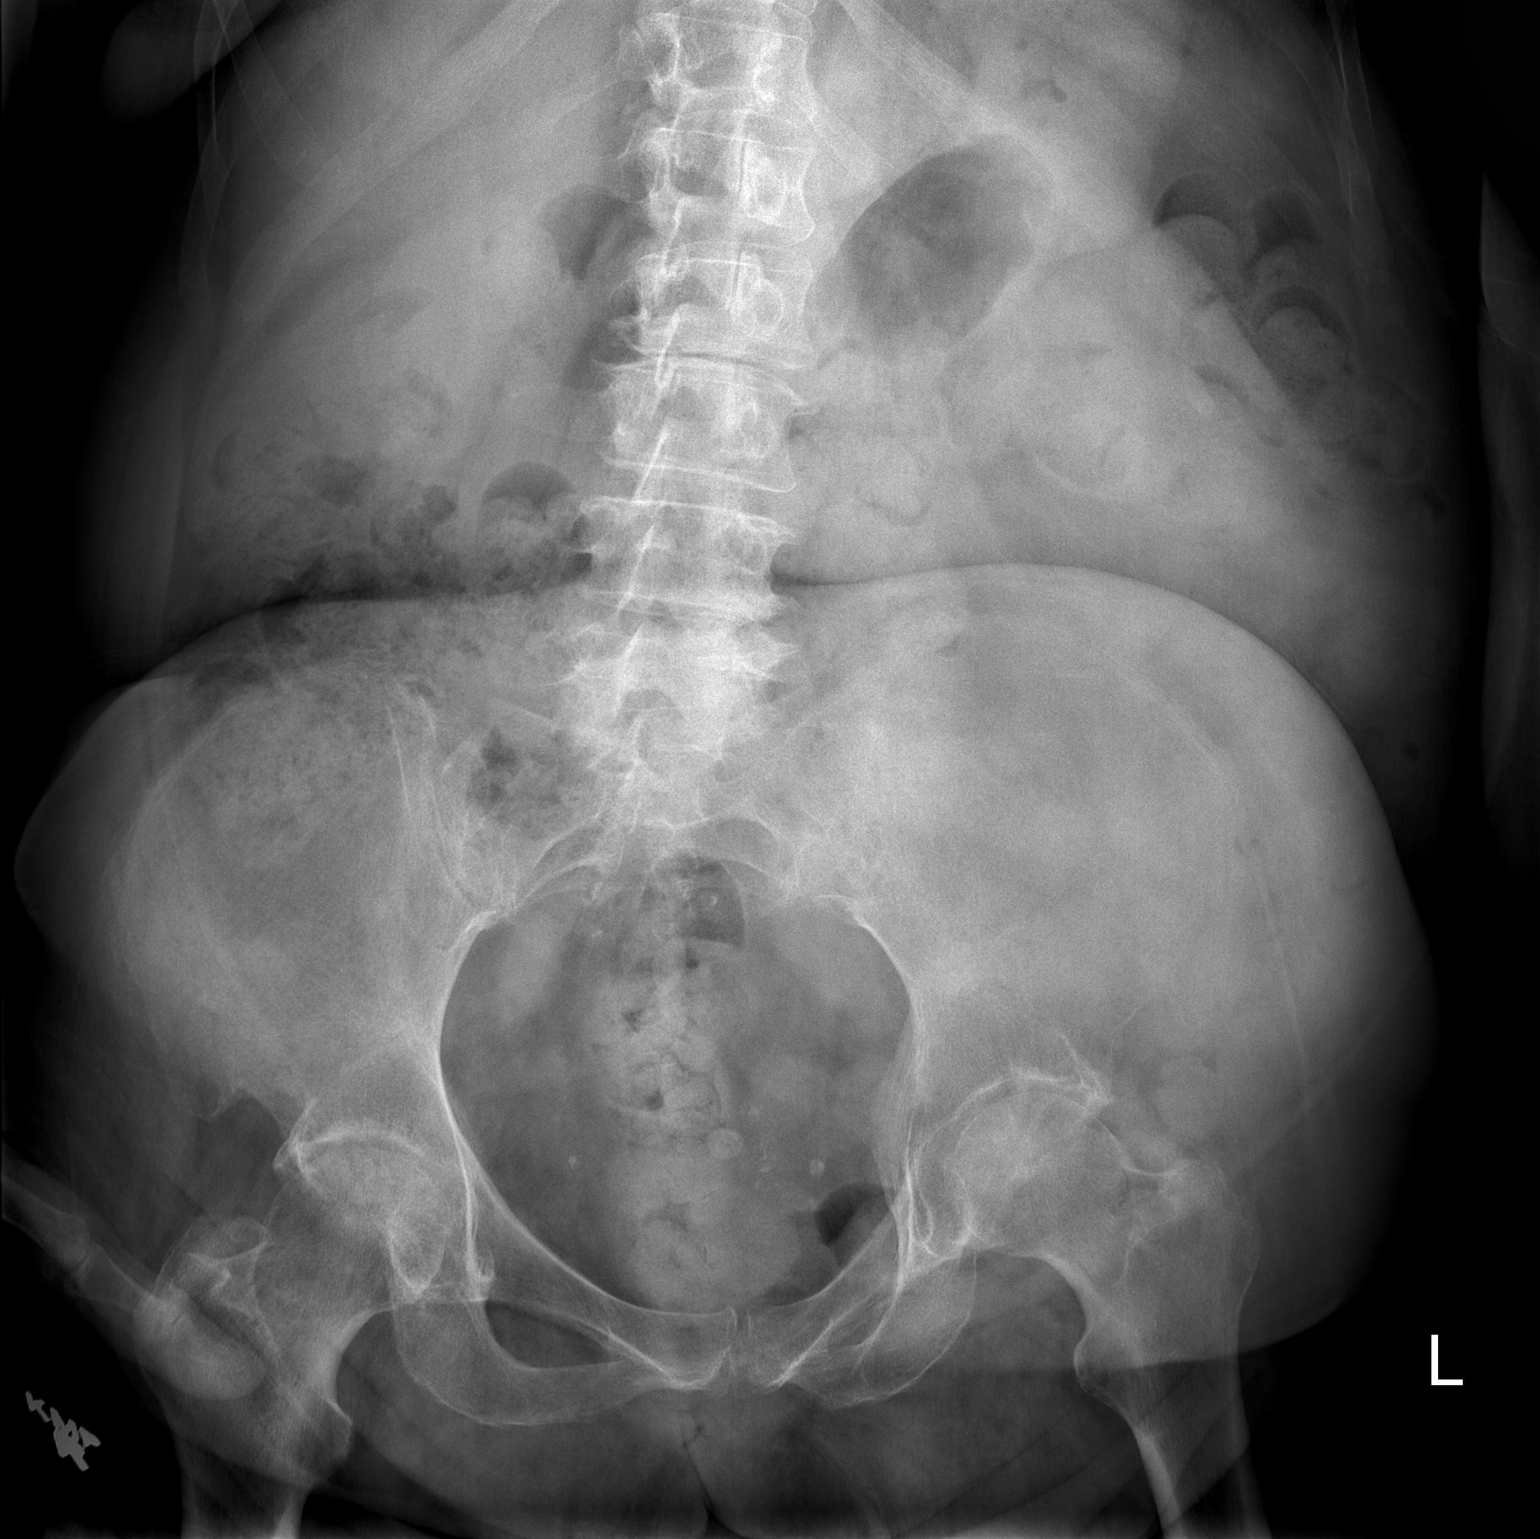

[1 of 1 positions shown; findings below may reference images not displayed]

FINDINGS: The bowel gas pattern is normal. Moderate to large amount of
retained large bowel stool. No suspicious radio-opaque calculi or
other significant radiographic abnormality are seen. Phleboliths
project in the pelvis.
IMPRESSION: Moderate to large amount of retained large bowel stool,
nonobstructive bowel gas pattern.

## 2016-12-01 ENCOUNTER — Other Ambulatory Visit: Payer: Self-pay | Admitting: Internal Medicine

## 2016-12-01 DIAGNOSIS — Z1231 Encounter for screening mammogram for malignant neoplasm of breast: Secondary | ICD-10-CM

## 2016-12-09 ENCOUNTER — Ambulatory Visit
Admission: RE | Admit: 2016-12-09 | Discharge: 2016-12-09 | Disposition: A | Discharge status: Home / Self Care | Payer: Medicare (Managed Care) | Source: Ambulatory Visit | Attending: Internal Medicine | Admitting: Internal Medicine

## 2016-12-09 ENCOUNTER — Other Ambulatory Visit: Payer: Self-pay | Admitting: Internal Medicine

## 2016-12-09 DIAGNOSIS — N63 Unspecified lump in unspecified breast: Secondary | ICD-10-CM

## 2016-12-09 DIAGNOSIS — Z1231 Encounter for screening mammogram for malignant neoplasm of breast: Secondary | ICD-10-CM

## 2016-12-09 IMAGING — US ULTRASOUND RIGHT BREAST LIMITED
1 series · 7 of 7 positions shown · non-contrast
Comparison: Outside mammograms and ultrasounds dated [DATE] and
[DATE].

CLINICAL DATA: 70-year-old female with history of right lumpectomy
in [2R]. The patient is in a wheelchair and has had a stroke. The
patient was last seen at [facility] in [DATE] at which
time breast MRI was recommended for further evaluation of an
asymmetry posterior to the lumpectomy site in the right breast. The
patient reports that when she went for the MRI, the doctor stated
that she would be unable to have one due to her physical
limitations. The patient also had a biopsy of the left breast in
[DATE] which was negative for malignancy and per the addended
biopsy report on [DATE] demonstrated sclerotic fibrous
tissue. This was thought possibly discordant and an MRI was also
recommended for evaluation. As noted above, MRI was unable to be
performed. The patient also states that she was unable to have
radiation.

EXAM:
2D DIGITAL DIAGNOSTIC BILATERAL MAMMOGRAM WITH CAD AND ADJUNCT TOMO
ULTRASOUND RIGHT BREAST

[Series 1: ultrasound right breast limited · 0.07mm/px · 7 of 7 slices shown]
[im 1/7]
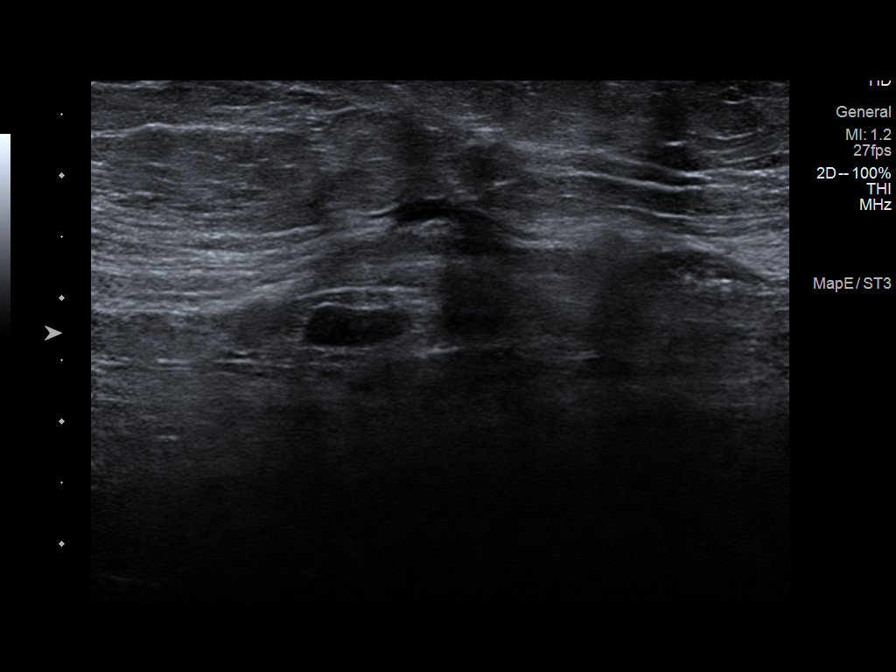
[im 2/7]
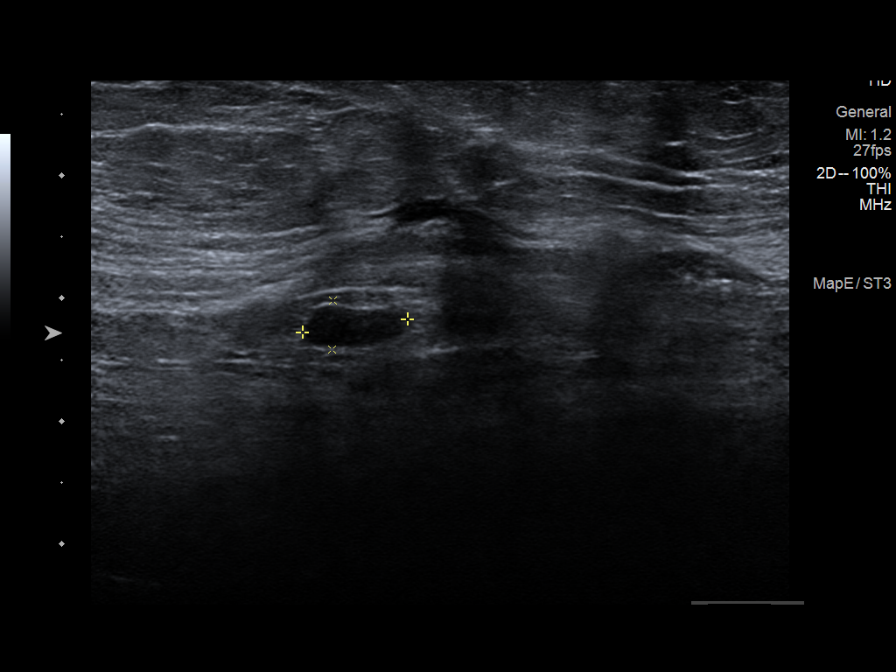
[im 3/7]
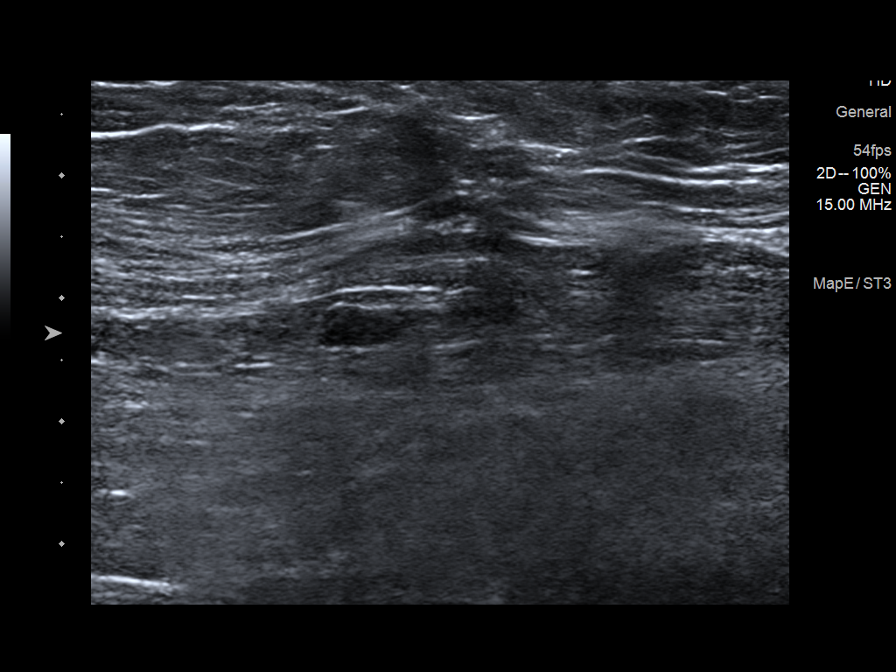
[im 4/7]
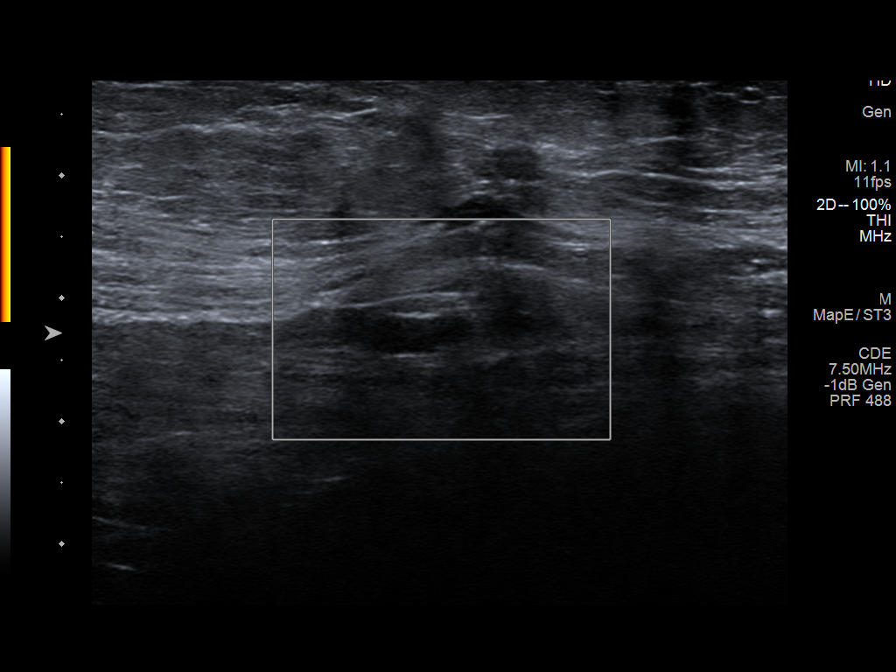
[im 5/7]
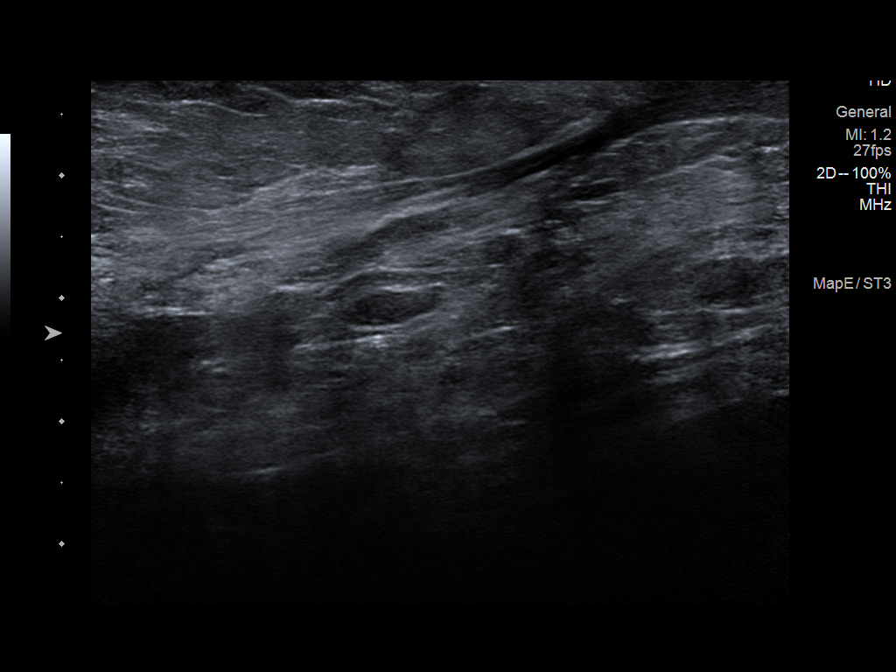
[im 6/7]
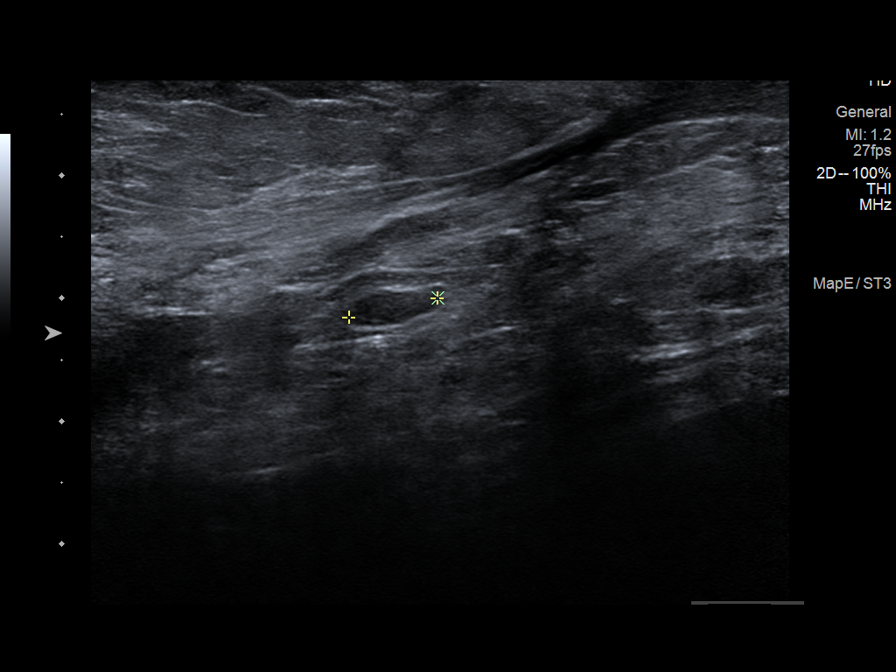
[im 7/7]
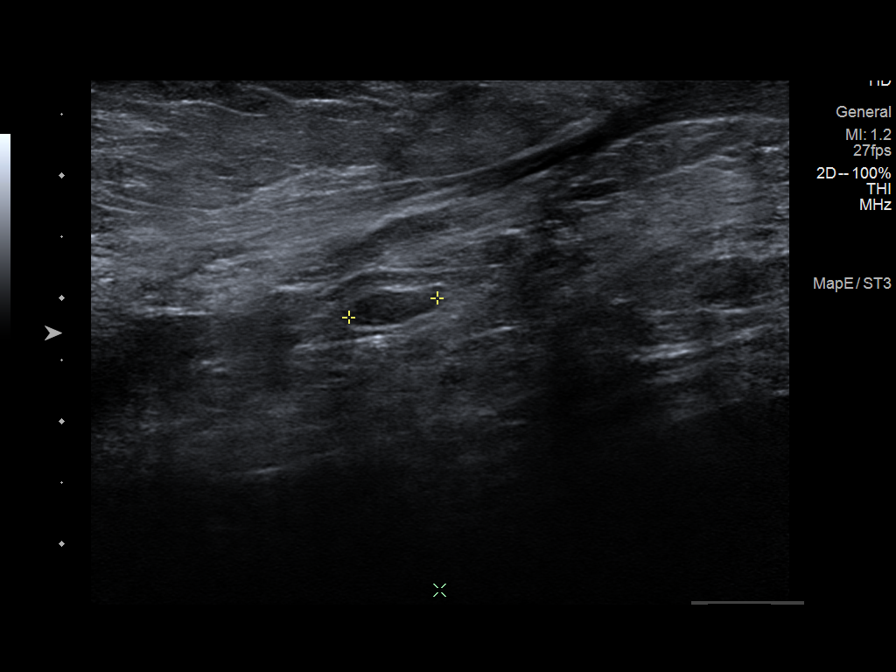

[7 of 7 positions shown; findings below may reference images not displayed]

ACR Breast Density Category c: The breast tissue is heterogeneously
dense, which may obscure small masses.
FINDINGS: Mammographic evaluation is limited by the patient being in a
wheelchair and inability to raise her left shoulder. Due to the
patient's shoulder difficulties, an MLO view of the left breast was
unable to be obtained. The best possible images were obtained.

Postlumpectomy changes are seen of the subareolar right breast.
Within the posterior upper, outer right breast, there is a
low-density oval mass with partially circumscribed and partially
obscured margins. This mass appears similar to [DATE]. A
ribbon shaped biopsy marker is seen on the CC view of the left
breast from prior benign biopsy. The breast parenchymal pattern on
today's single CC view of the left breast appears unchanged from a
[2R] exam. The [2R] exam describes stable postoperative changes of
the left breast.

Mammographic images were processed with CAD.

On physical exam, a possible scar is noted along the inferior aspect
of the left areola. The patient is unsure whether she may have had
an excisional biopsy of the left breast in the past. Scarring from
prior right lumpectomy is noted on the right breast. Otherwise, no
discrete mass is felt in the area of concern in the upper, outer
right breast.

Targeted ultrasound of the upper, outer right breast and lumpectomy
bed was performed demonstrating an oval, circumscribed, hypoechoic
mass at 11 o'clock, 4 cm from the nipple measuring 9 x 4 x 7 mm.
This mass may represent a small complicated cyst and is thought to
correspond to the mammographic finding. No suspicious mass is seen
in the area of concern in the upper, outer right breast. Expected
postsurgical scarring is seen at the lumpectomy site without
underlying mass.
IMPRESSION: 1. Probably benign right breast mass at 11 o'clock, 4 cm from the
nipple.
2. No suspicious mass or other abnormality is seen within the left
breast. On today's limited evaluation of the left breast, the breast
parenchymal pattern appears unchanged from the [2R] exam. Findings
noted within the left breast on outside mammogram and ultrasound
dated [DATE] are thought likely related to postsurgical
change. As noted above, the patient is unable to have a breast MRI.

RECOMMENDATION:
1. Right diagnostic mammogram and possible ultrasound in 6 months.
2. Close clinical follow-up and physical examination is recommended
as imaging evaluation is limited as noted above.

I have discussed the findings and recommendations with the patient.
Results were also provided in writing at the conclusion of the
visit. If applicable, a reminder letter will be sent to the patient
regarding the next appointment.

BI-RADS CATEGORY  3: Probably benign.

## 2016-12-09 IMAGING — MG 2D DIGITAL DIAGNOSTIC BILATERAL MAMMOGRAM WITH CAD AND ADJUNCT T
7 of 9 series · 7 of 21 positions shown · non-contrast
Comparison: Outside mammograms and ultrasounds dated [DATE] and
[DATE].

CLINICAL DATA: 70-year-old female with history of right lumpectomy
in [2R]. The patient is in a wheelchair and has had a stroke. The
patient was last seen at [facility] in [DATE] at which
time breast MRI was recommended for further evaluation of an
asymmetry posterior to the lumpectomy site in the right breast. The
patient reports that when she went for the MRI, the doctor stated
that she would be unable to have one due to her physical
limitations. The patient also had a biopsy of the left breast in
[DATE] which was negative for malignancy and per the addended
biopsy report on [DATE] demonstrated sclerotic fibrous
tissue. This was thought possibly discordant and an MRI was also
recommended for evaluation. As noted above, MRI was unable to be
performed. The patient also states that she was unable to have
radiation.

EXAM:
2D DIGITAL DIAGNOSTIC BILATERAL MAMMOGRAM WITH CAD AND ADJUNCT TOMO
ULTRASOUND RIGHT BREAST

[R MLO]
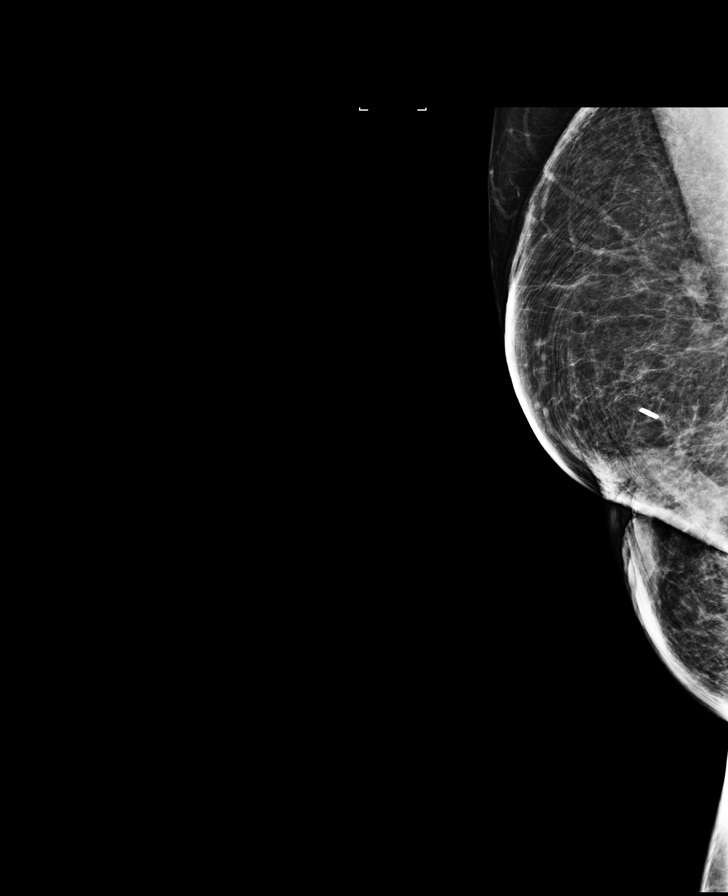

[R CC]
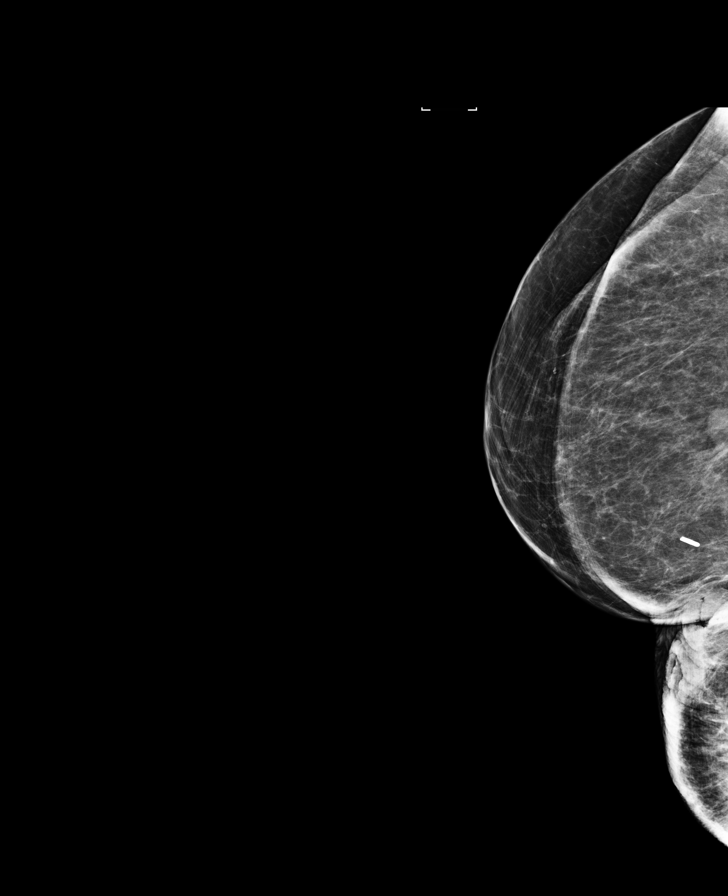

[R CC synth-2D]
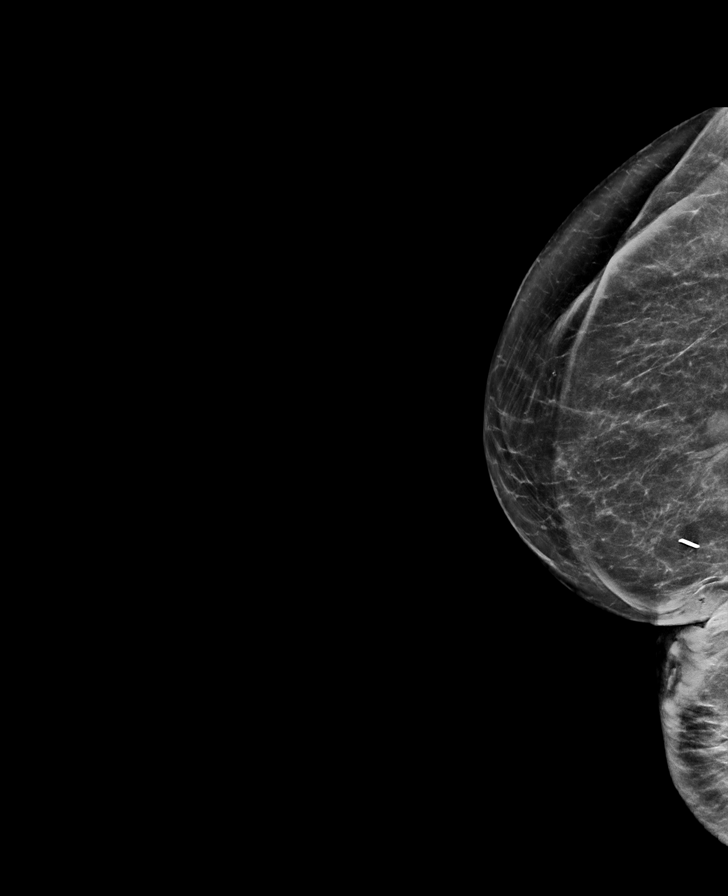

[R MLO synth-2D]
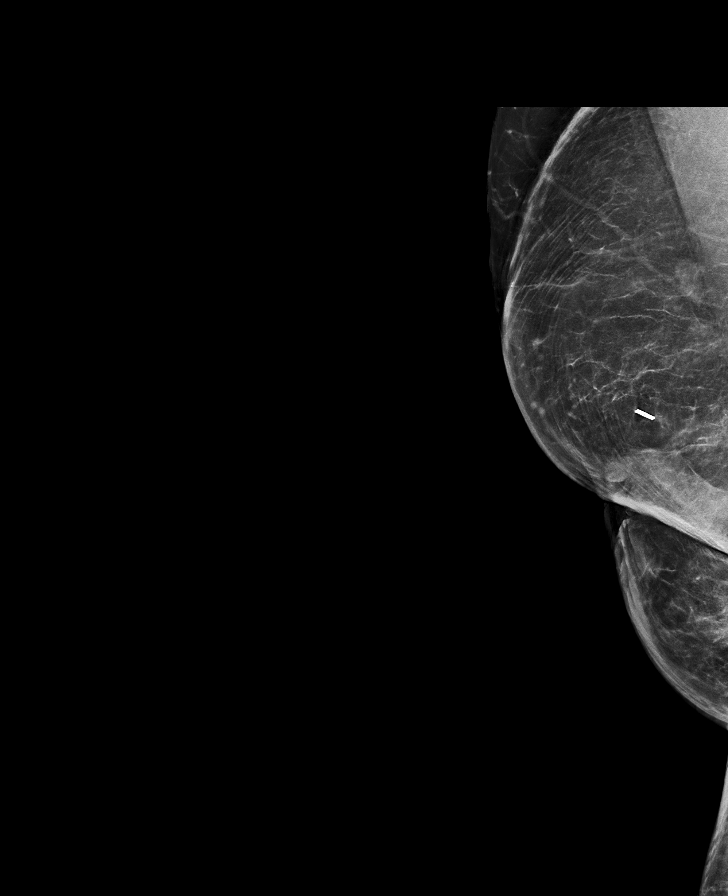

[L CC]
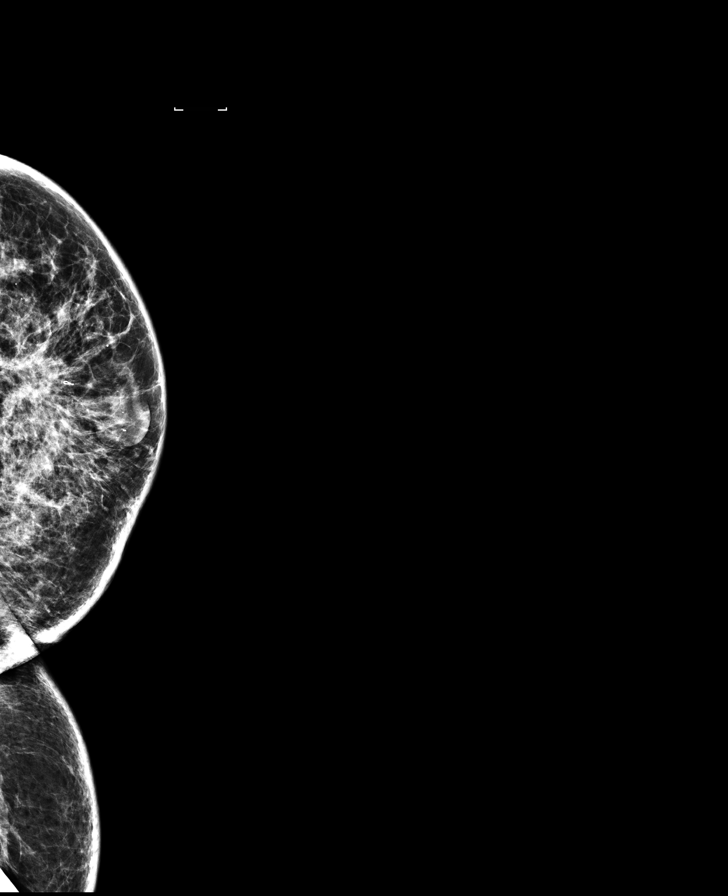

[L CC synth-2D]
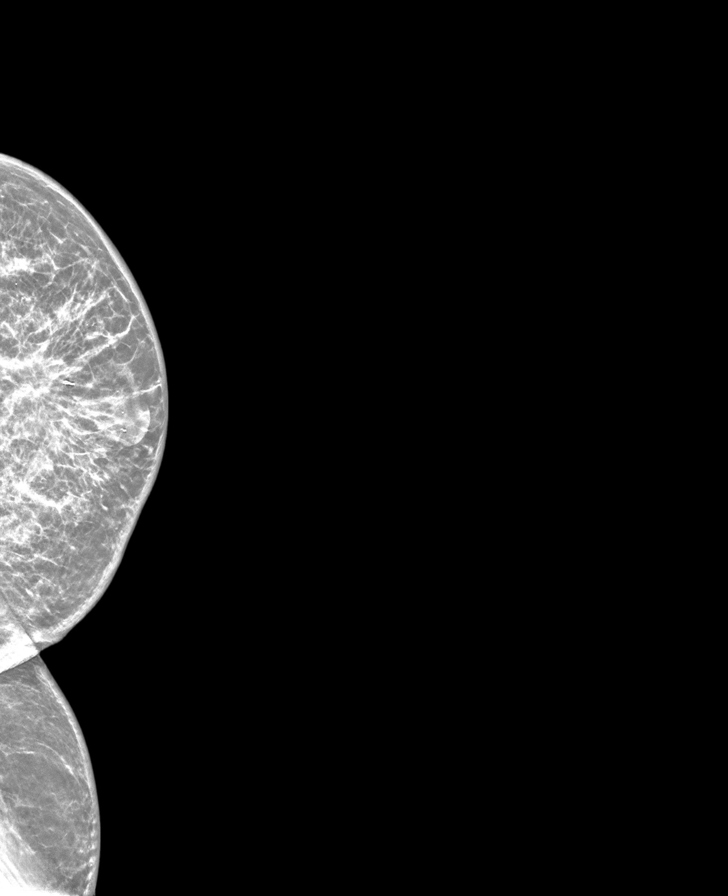

[R CC tomo · tomo slice 35/68.0]
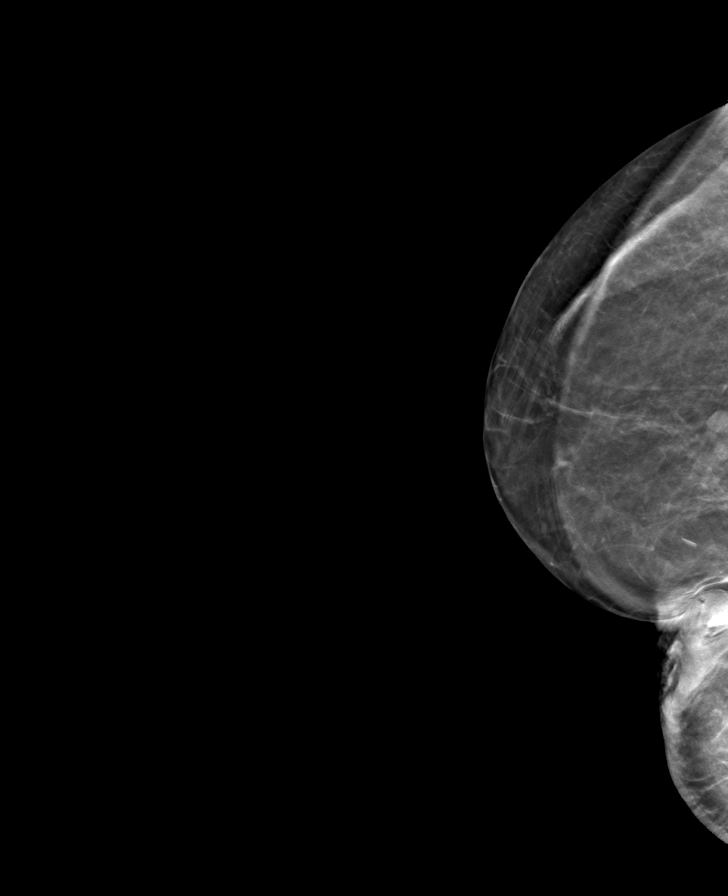

[7 of 21 positions shown; findings below may reference images not displayed]

ACR Breast Density Category c: The breast tissue is heterogeneously
dense, which may obscure small masses.
FINDINGS: Mammographic evaluation is limited by the patient being in a
wheelchair and inability to raise her left shoulder. Due to the
patient's shoulder difficulties, an MLO view of the left breast was
unable to be obtained. The best possible images were obtained.

Postlumpectomy changes are seen of the subareolar right breast.
Within the posterior upper, outer right breast, there is a
low-density oval mass with partially circumscribed and partially
obscured margins. This mass appears similar to [DATE]. A
ribbon shaped biopsy marker is seen on the CC view of the left
breast from prior benign biopsy. The breast parenchymal pattern on
today's single CC view of the left breast appears unchanged from a
[2R] exam. The [2R] exam describes stable postoperative changes of
the left breast.

Mammographic images were processed with CAD.

On physical exam, a possible scar is noted along the inferior aspect
of the left areola. The patient is unsure whether she may have had
an excisional biopsy of the left breast in the past. Scarring from
prior right lumpectomy is noted on the right breast. Otherwise, no
discrete mass is felt in the area of concern in the upper, outer
right breast.

Targeted ultrasound of the upper, outer right breast and lumpectomy
bed was performed demonstrating an oval, circumscribed, hypoechoic
mass at 11 o'clock, 4 cm from the nipple measuring 9 x 4 x 7 mm.
This mass may represent a small complicated cyst and is thought to
correspond to the mammographic finding. No suspicious mass is seen
in the area of concern in the upper, outer right breast. Expected
postsurgical scarring is seen at the lumpectomy site without
underlying mass.
IMPRESSION: 1. Probably benign right breast mass at 11 o'clock, 4 cm from the
nipple.
2. No suspicious mass or other abnormality is seen within the left
breast. On today's limited evaluation of the left breast, the breast
parenchymal pattern appears unchanged from the [2R] exam. Findings
noted within the left breast on outside mammogram and ultrasound
dated [DATE] are thought likely related to postsurgical
change. As noted above, the patient is unable to have a breast MRI.

RECOMMENDATION:
1. Right diagnostic mammogram and possible ultrasound in 6 months.
2. Close clinical follow-up and physical examination is recommended
as imaging evaluation is limited as noted above.

I have discussed the findings and recommendations with the patient.
Results were also provided in writing at the conclusion of the
visit. If applicable, a reminder letter will be sent to the patient
regarding the next appointment.

BI-RADS CATEGORY  3: Probably benign.

## 2016-12-24 ENCOUNTER — Encounter (HOSPITAL_COMMUNITY): Payer: Self-pay

## 2016-12-24 ENCOUNTER — Emergency Department (HOSPITAL_COMMUNITY): Payer: Medicare (Managed Care)

## 2016-12-24 ENCOUNTER — Emergency Department (HOSPITAL_COMMUNITY)
Admission: EM | Admit: 2016-12-24 | Discharge: 2016-12-25 | Disposition: A | Discharge status: Home / Self Care | Payer: Medicare (Managed Care) | Attending: Emergency Medicine | Admitting: Emergency Medicine

## 2016-12-24 DIAGNOSIS — I10 Essential (primary) hypertension: Secondary | ICD-10-CM | POA: Diagnosis not present

## 2016-12-24 DIAGNOSIS — R112 Nausea with vomiting, unspecified: Secondary | ICD-10-CM | POA: Diagnosis present

## 2016-12-24 DIAGNOSIS — Z853 Personal history of malignant neoplasm of breast: Secondary | ICD-10-CM | POA: Diagnosis not present

## 2016-12-24 DIAGNOSIS — R42 Dizziness and giddiness: Secondary | ICD-10-CM | POA: Insufficient documentation

## 2016-12-24 DIAGNOSIS — Z79899 Other long term (current) drug therapy: Secondary | ICD-10-CM | POA: Diagnosis not present

## 2016-12-24 DIAGNOSIS — Z7982 Long term (current) use of aspirin: Secondary | ICD-10-CM | POA: Insufficient documentation

## 2016-12-24 DIAGNOSIS — M7989 Other specified soft tissue disorders: Secondary | ICD-10-CM

## 2016-12-24 HISTORY — DX: Unspecified glaucoma: H40.9

## 2016-12-24 IMAGING — CT CT HEAD W/O CM
3 series · 15 of 45 positions shown, 18 images · non-contrast
Comparison: None.

CLINICAL DATA: Dizziness for 4 days.  Left-sided weakness.

EXAM:
CT HEAD WITHOUT CONTRAST
TECHNIQUE: Contiguous axial images were obtained from the base of the skull
through the vertex without intravenous contrast.

[Series 2: head wo · axial · 0.46mm/px · z∈[+117,+232]mm · 9 of 28 slices shown, 12 images]
[im 3/28  brain]
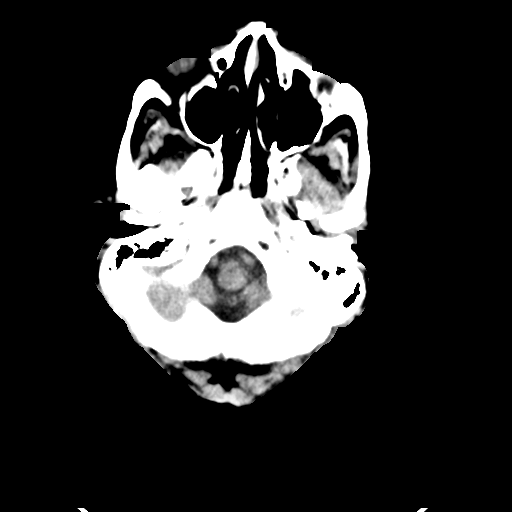
[im 3/28  bone]
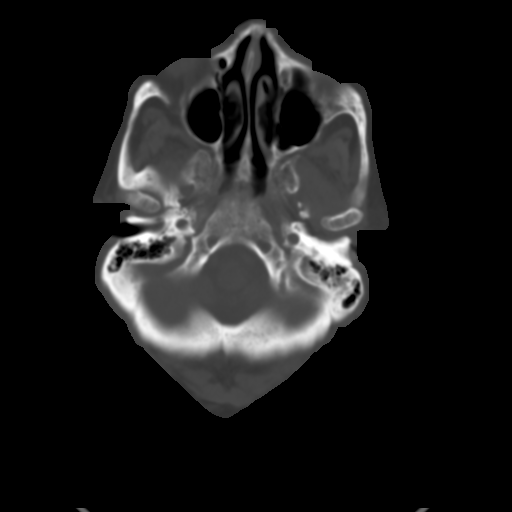
[im 6/28  brain]
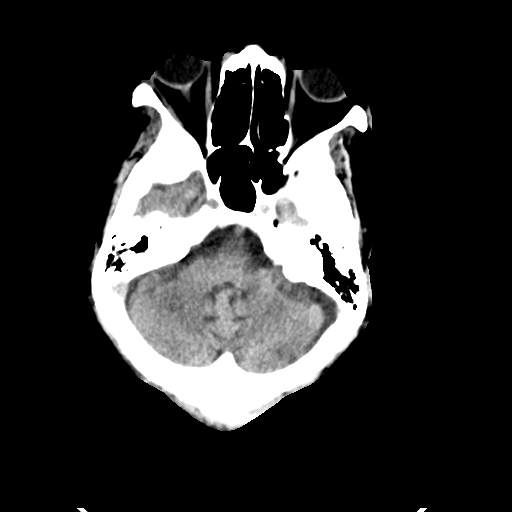
[im 9/28  brain]
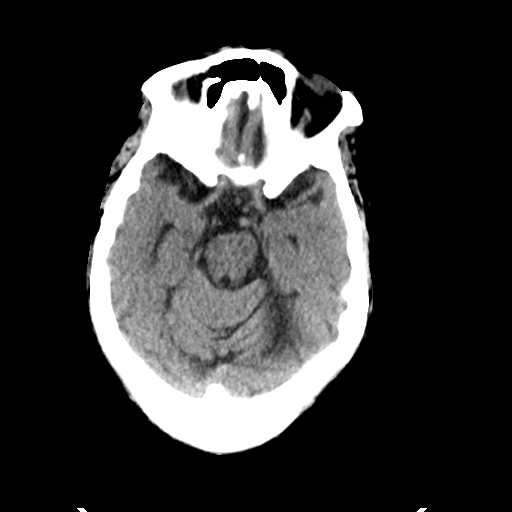
[im 12/28  brain]
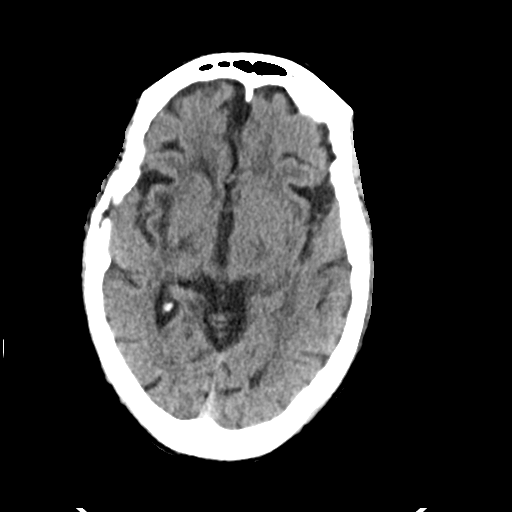
[im 15/28  brain]
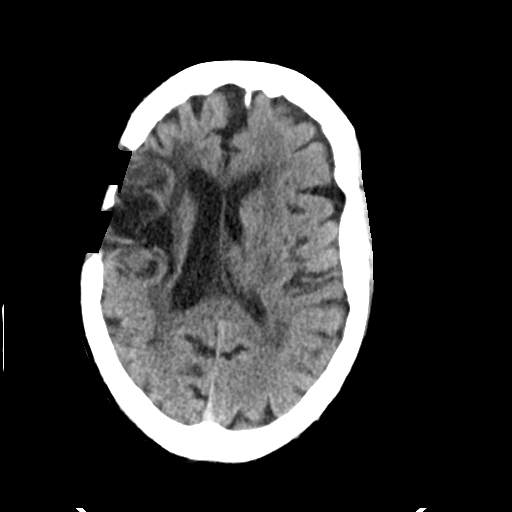
[im 15/28  bone]
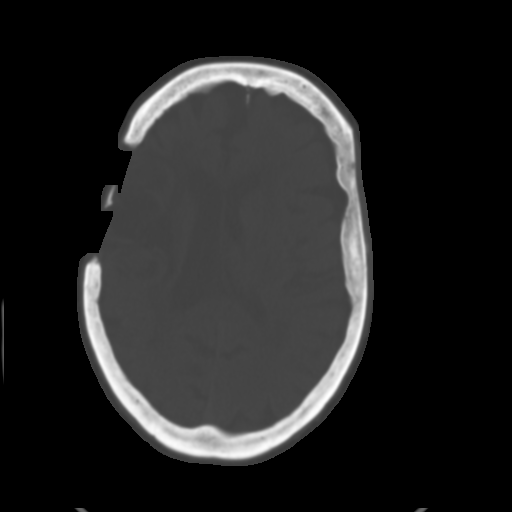
[im 17/28  brain]
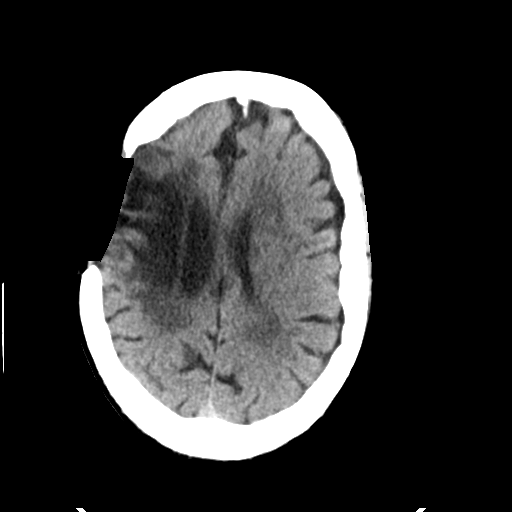
[im 20/28  brain]
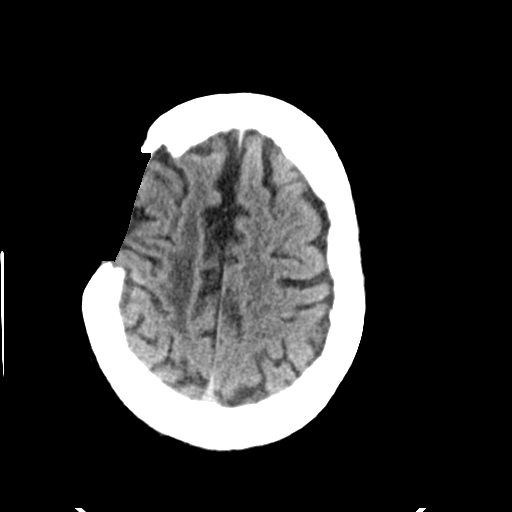
[im 23/28  brain]
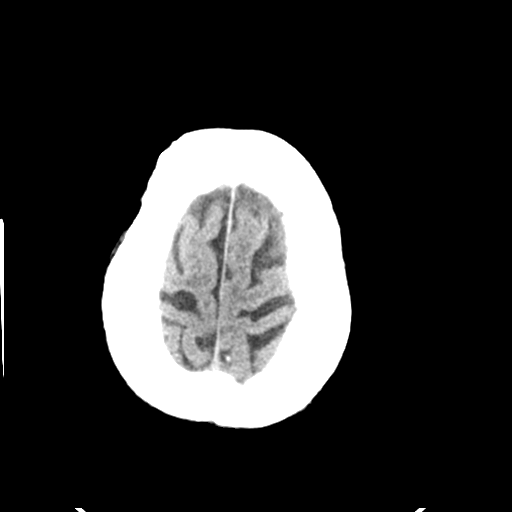
[im 26/28  brain]
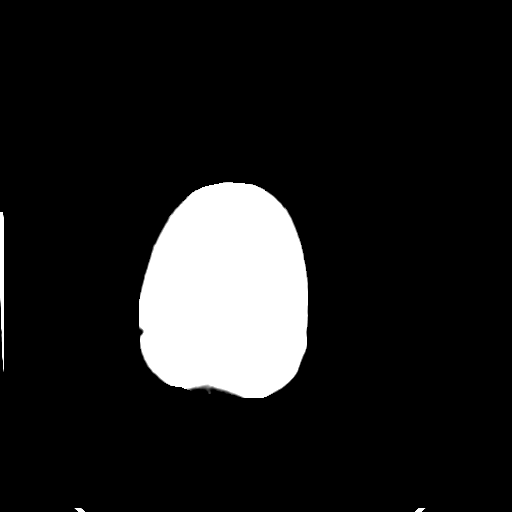
[im 26/28  bone]
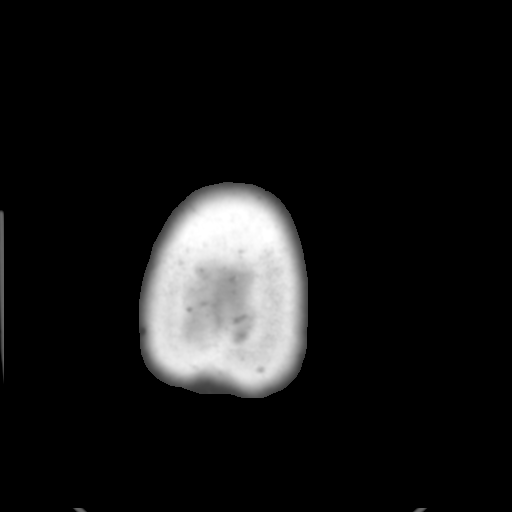

[Series 4: coronal soft tissue · coronal · 0.34mm/px · 3 of 74 slices shown]
[im 25/74  brain]
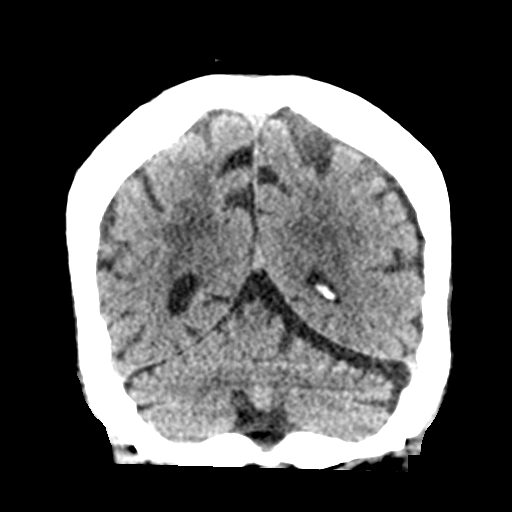
[im 33/74  brain]
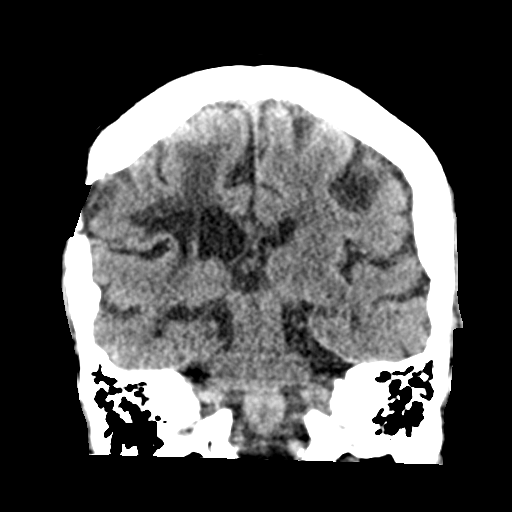
[im 41/74  brain]
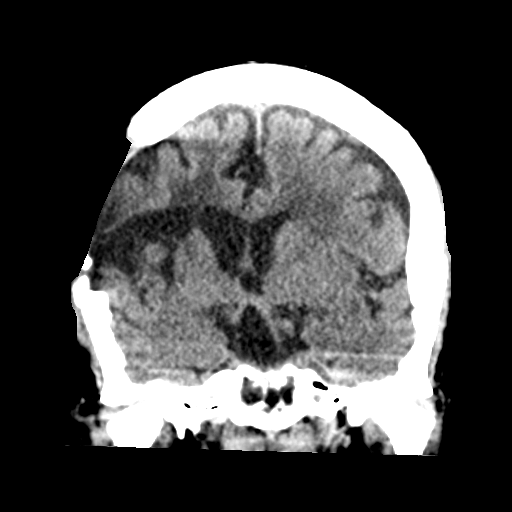

[Series 5: sagittal soft tissue · sagittal · 0.32mm/px · 3 of 64 slices shown]
[im 22/64  brain]
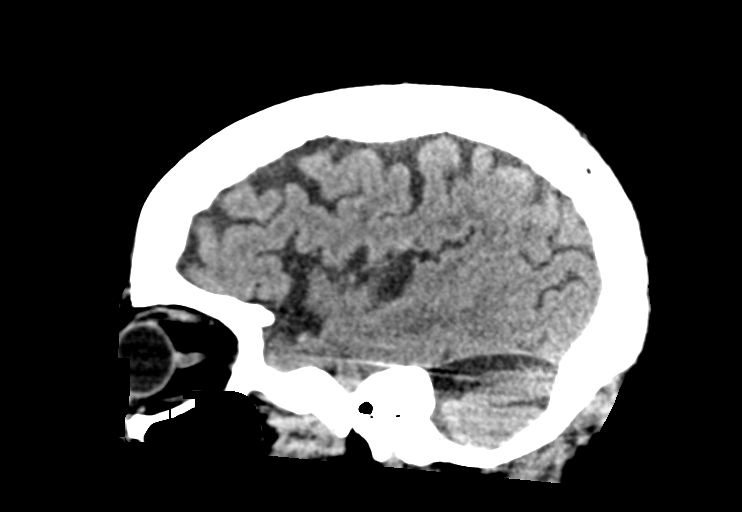
[im 32/64  brain]
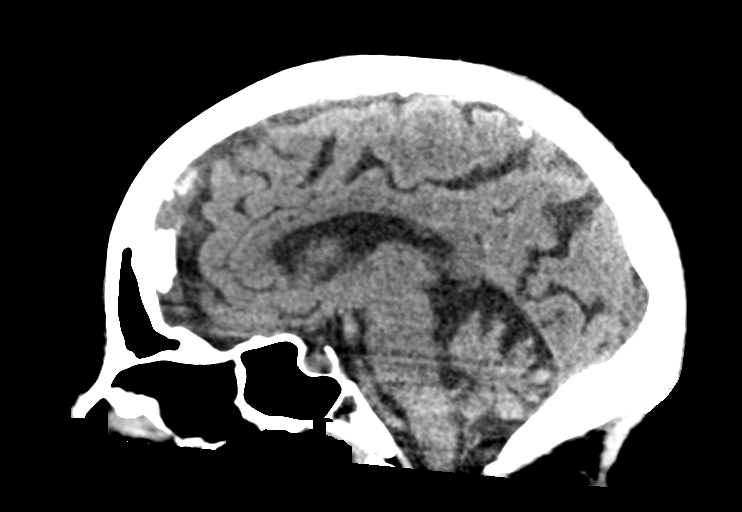
[im 43/64  brain]
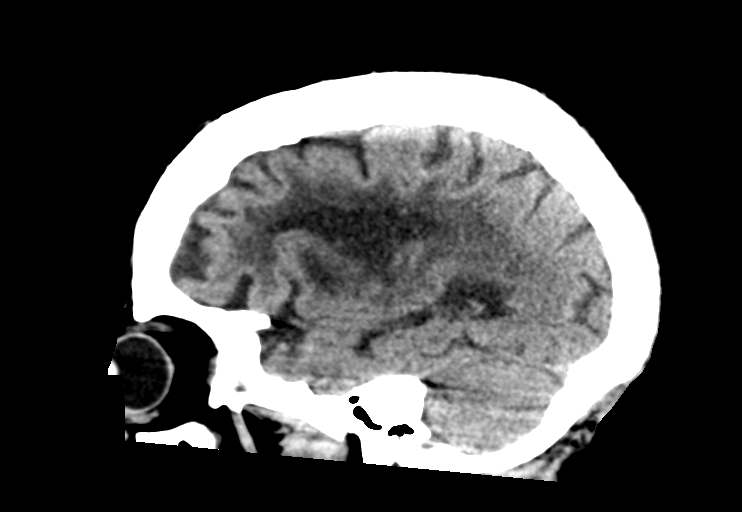

[15 of 45 positions shown; findings below may reference images not displayed]

FINDINGS: Brain: Post right frontal craniotomy with encephalomalacia in the
temporal frontal lobe. Mild ex vacuo dilatation of the right lateral
ventricle no acute hemorrhage or evidence of acute ischemia. No
subdural or extra-axial fluid collection. Mild generalized atrophy.
Mild rightward midline shift is likely chronic and postsurgical.

Vascular: No hyperdense vessel or unexpected calcification.

Skull: Post right frontal cranioplasty.  No acute skull abnormality.

Sinuses/Orbits: Paranasal sinuses and mastoid air cells are clear.
The visualized orbits are unremarkable.

Other: None.
IMPRESSION: 1.  No acute intracranial abnormality.
2. Post right craniotomy and cranioplasty with associated
encephalomalacia. Mild ex vacuo dilatation of the right lateral
ventricle. Age-related atrophy and moderate periventricular chronic
small vessel ischemia.

## 2016-12-24 MED ORDER — ONDANSETRON 4 MG PO TBDP
4.0000 mg | ORAL_TABLET | Freq: Once | ORAL | Status: AC
  Administered 2016-12-24: 4 mg via ORAL
  Filled 2016-12-24: qty 1

## 2016-12-24 MED ORDER — ONDANSETRON HCL 4 MG/2ML IJ SOLN: 4.0000 mg | Freq: Once | INTRAMUSCULAR | Status: DC

## 2016-12-24 MED ORDER — MECLIZINE HCL 12.5 MG PO TABS
25.0000 mg | ORAL_TABLET | Freq: Once | ORAL | Status: AC
  Administered 2016-12-24: 25 mg via ORAL
  Filled 2016-12-24: qty 2

## 2016-12-24 NOTE — ED Provider Notes (Signed)
[facility] DEPT Provider Note   CSN: [id number] Arrival date & time: 12/24/16  2156  By signing my name below, I, [name], attest that this documentation has been prepared under the direction and in the presence of physician practitioner, Ezequiel Essex, MD. Electronically Signed: [name], Scribe. 12/24/2016. 11:10 PM.  History   Chief Complaint Chief Complaint  Patient presents with  . Emesis    The history is provided by the patient. No language interpreter was used.     HPI Comments: Kirsten Mcmahon is a 71 y.o. female with PMHx significant for vertigo and DM who presents to the Emergency Department via EMS complaining of constant dizziness beginning 4 days ago. Pt describes her dizziness as room-spinning. She notes associated intermittent chills and vomiting; she vomited twice today and once yesterday. She states her dizziness is worse with certain movements and positions, especially standing up. She reports she was recently without her Meclizine medication for a few days but her prescription was refilled yesterday; she believes her dizziness is a result of being without this medication. Pt notes a h/o stroke and secondary weakness of her left-sided extremities. Pt is anticoagulated with a daily aspirin. She ambulates with a cane at baseline. She notes some left leg swelling at baseline and denies acute change in this condition. She denies abdominal pain, CP, SOB, headache, acute vision changes, or any other associated symptoms. Pt lives with her daughter.  Past Medical History:  Diagnosis Date  . Brain aneurysm   . Breast cancer ([place])   . Cancer ([place])   . Glaucoma   . High cholesterol   . Hypertension   . Seizures ([place])   . Stroke ([place])   . Thyroid mass   . Vertigo     There are no active problems to display for this patient.   Past Surgical History:  Procedure Laterality Date  . ABDOMINAL SURGERY    . BRAIN SURGERY     due to aneurysm  . BREAST  LUMPECTOMY     right breast    OB History    No data available       Home Medications    Prior to Admission medications   Medication Sig Start Date End Date Taking? Authorizing Provider  acetaminophen (TYLENOL) 500 MG tablet Take 500 mg by mouth every 6 (six) hours as needed for mild pain or moderate pain.   Yes Historical Provider, MD  amLODipine (NORVASC) 10 MG tablet Take 10 mg by mouth daily.   Yes Historical Provider, MD  aspirin EC 81 MG tablet Take 81 mg by mouth daily.   Yes Historical Provider, MD  bimatoprost (LUMIGAN) 0.01 % SOLN Place 1 drop into the left eye at bedtime.   Yes Historical Provider, MD  hydrochlorothiazide (MICROZIDE) 12.5 MG capsule Take 12.5 mg by mouth daily.   Yes Historical Provider, MD  letrozole (FEMARA) 2.5 MG tablet Take 2.5 mg by mouth daily.   Yes Historical Provider, MD  losartan (COZAAR) 100 MG tablet Take 100 mg by mouth daily.   Yes Historical Provider, MD  meclizine (ANTIVERT) 12.5 MG tablet Take 12.5 mg by mouth 2 (two) times daily as needed for dizziness.   Yes Historical Provider, MD  mirabegron ER (MYRBETRIQ) 25 MG TB24 tablet Take 25 mg by mouth daily.   Yes Historical Provider, MD  polyethylene glycol powder (GLYCOLAX/MIRALAX) powder Take 17 g by mouth daily as needed for mild constipation or moderate constipation.   Yes Historical Provider, MD  ranitidine (  ZANTAC) 150 MG tablet Take 150 mg by mouth 2 (two) times daily.   Yes Historical Provider, MD  senna-docusate (SENNA S) 8.6-50 MG tablet Take 1 tablet by mouth daily.   Yes Historical Provider, MD  simethicone (GAS-X) 80 MG chewable tablet Chew 80 mg by mouth every 6 (six) hours as needed for flatulence.   Yes Historical Provider, MD  simvastatin (ZOCOR) 10 MG tablet Take 10 mg by mouth daily.   Yes Historical Provider, MD  Vitamin D, Ergocalciferol, (DRISDOL) 50000 units CAPS capsule Take 50,000 Units by mouth every 30 (thirty) days.   Yes Historical Provider, MD    Family  History No family history on file.  Social History Social History  Substance Use Topics  . Smoking status: Never Smoker  . Smokeless tobacco: Never Used  . Alcohol use No     Allergies   Levemir [insulin detemir]   Review of Systems Review of Systems  A complete 10 system review of systems was obtained and all systems are negative except as noted in the HPI and PMH.   Physical Exam Updated Vital Signs BP 182/92 (BP Location: Left Arm)   Pulse 88   Temp 97.8 F (36.6 C) (Oral)   Resp 16   Ht 5\' 2"  (1.575 m)   Wt 160 lb (72.6 kg)   SpO2 99%   BMI 29.26 kg/m   Physical Exam  Constitutional: She is oriented to person, place, and time. She appears well-developed and well-nourished. No distress.  HENT:  Head: Normocephalic and atraumatic.  Mouth/Throat: Oropharynx is clear and moist. No oropharyngeal exudate.  Eyes: Conjunctivae and EOM are normal. Pupils are equal, round, and reactive to light.  Neck: Normal range of motion. Neck supple.  No meningismus.  Cardiovascular: Normal rate, regular rhythm, normal heart sounds and intact distal pulses.   No murmur heard. Pulmonary/Chest: Effort normal and breath sounds normal. No respiratory distress.  Abdominal: Soft. There is no tenderness. There is no rebound and no guarding.  Musculoskeletal: She exhibits edema. She exhibits no tenderness.  Left leg edema at baseline per patient.   Neurological: She is alert and oriented to person, place, and time. No cranial nerve deficit. She exhibits normal muscle tone. Coordination normal.  Left sided weakness at baseline (left hemiparesis). 5/5 strength of right arm and right leg. No ataxia on finger-to-nose on the right. No nystagmus. Cranial nerves 2-12 intact. Positive Romberg's. Steady gait with cane  Skin: Skin is warm.  Psychiatric: She has a normal mood and affect. Her behavior is normal.  Nursing note and vitals reviewed.    ED Treatments / Results  Labs (all labs ordered  are listed, but only abnormal results are displayed) Labs Reviewed  CBC WITH DIFFERENTIAL/PLATELET - Abnormal; Notable for the following:       Result Value   WBC 12.0 (*)    Neutro Abs 10.1 (*)    All other components within normal limits  COMPREHENSIVE METABOLIC PANEL - Abnormal; Notable for the following:    Glucose, Bld 215 (*)    Creatinine, Ser 1.14 (*)    Calcium 8.6 (*)    Albumin 3.1 (*)    GFR calc non Af Amer 47 (*)    GFR calc Af Amer 54 (*)    All other components within normal limits  CBG MONITORING, ED - Abnormal; Notable for the following:    Glucose-Capillary 124 (*)    All other components within normal limits  TROPONIN I  TROPONIN I  EKG  EKG Interpretation  Date/Time:  Friday December 24 2016 23:28:33 EST Ventricular Rate:  101 PR Interval:    QRS Duration: 74 QT Interval:  411 QTC Calculation: 533 R Axis:   -20 Text Interpretation:  Sinus tachycardia Inferior infarct, old Anterior infarct, old Prolonged QT interval No previous ECGs available Confirmed by Wyvonnia Dusky  MD, Fouad Taul [phone]) on 12/25/2016 12:02:37 AM       Radiology Ct Head Wo Contrast  Result Date: 12/25/2016 CLINICAL DATA:  Dizziness for 4 days.  Left-sided weakness. EXAM: CT HEAD WITHOUT CONTRAST TECHNIQUE: Contiguous axial images were obtained from the base of the skull through the vertex without intravenous contrast. COMPARISON:  None. FINDINGS: Brain: Post right frontal craniotomy with encephalomalacia in the temporal frontal lobe. Mild ex vacuo dilatation of the right lateral ventricle no acute hemorrhage or evidence of acute ischemia. No subdural or extra-axial fluid collection. Mild generalized atrophy. Mild rightward midline shift is likely chronic and postsurgical. Vascular: No hyperdense vessel or unexpected calcification. Skull: Post right frontal cranioplasty.  No acute skull abnormality. Sinuses/Orbits: Paranasal sinuses and mastoid air cells are clear. The visualized orbits are  unremarkable. Other: None. IMPRESSION: 1.  No acute intracranial abnormality. 2. Post right craniotomy and cranioplasty with associated encephalomalacia. Mild ex vacuo dilatation of the right lateral ventricle. Age-related atrophy and moderate periventricular chronic small vessel ischemia. Electronically Signed   By: Jeb Levering M.D.   On: 12/25/2016 00:52    Procedures Procedures (including critical care time)  DIAGNOSTIC STUDIES: Oxygen Saturation is 99% on RA, normal by my interpretation.    COORDINATION OF CARE: 11:19 PM Discussed treatment plan with pt at bedside and pt agreed to plan.  Medications Ordered in ED Medications - No data to display   Initial Impression / Assessment and Plan / ED Course  I have reviewed the triage vital signs and the nursing notes.  Pertinent labs & imaging results that were available during my care of the patient were reviewed by me and considered in my medical decision making (see chart for details).   patient with positional dizziness and vertigo since Monday. History of vertigo in the past and has been out of her meclizine. Vomiting 2 today. Denies headache or vision change. Chronic left-sided weakness from previous stroke. Chronic L leg swelling.  Orthostatics positive. HR elevates to 120s with standing. IVF and PO fluid given.  CT head stable.  Labs reassuring.  Troponin negative x2.  Patient feels improved with meclizine. No further dizziness. Ambulatory with cane.  Denies dizziness.  MRI not available, doubt posterior circulation CVA as no new deficits and symptoms improving.  She has meclizine at home and wishes to go home.  Followup with PCP, return precautions discussed.   Final Clinical Impressions(s) / ED Diagnoses   Final diagnoses:  Dizziness  Non-intractable vomiting with nausea, unspecified vomiting type    New Prescriptions New Prescriptions   No medications on file  I personally performed the services described in  this documentation, which was scribed in my presence. The recorded information has been reviewed and is accurate.    Ezequiel Essex, MD 12/25/16 [phone]

## 2016-12-24 NOTE — ED Triage Notes (Signed)
Pt reports dizziness with hx of vertigo, but has not taken her vertigo medication as [employer] has been delayed d/t the weather. Pt reports vomiting once or twice daily x 1 week. Pt reports chills. Pt has not checked temp at home. Pt is afebrile in ED.

## 2016-12-25 ENCOUNTER — Telehealth (HOSPITAL_COMMUNITY): Payer: Self-pay | Admitting: Emergency Medicine

## 2016-12-25 LAB — CBC WITH DIFFERENTIAL/PLATELET
Basophils Absolute: 0 10*3/uL (ref 0.0–0.1)
Basophils Relative: 0 %
Eosinophils Absolute: 0 10*3/uL (ref 0.0–0.7)
Eosinophils Relative: 0 %
HEMATOCRIT: 38.5 % (ref 36.0–46.0)
HEMOGLOBIN: 12.9 g/dL (ref 12.0–15.0)
LYMPHS ABS: 1.4 10*3/uL (ref 0.7–4.0)
Lymphocytes Relative: 12 %
MCH: 31.2 pg (ref 26.0–34.0)
MCHC: 33.5 g/dL (ref 30.0–36.0)
MCV: 93.2 fL (ref 78.0–100.0)
MONOS PCT: 4 %
Monocytes Absolute: 0.4 10*3/uL (ref 0.1–1.0)
NEUTROS ABS: 10.1 10*3/uL — AB (ref 1.7–7.7)
NEUTROS PCT: 84 %
Platelets: 291 10*3/uL (ref 150–400)
RBC: 4.13 MIL/uL (ref 3.87–5.11)
RDW: 12.2 % (ref 11.5–15.5)
WBC: 12 10*3/uL — ABNORMAL HIGH (ref 4.0–10.5)

## 2016-12-25 LAB — COMPREHENSIVE METABOLIC PANEL
ALT: 21 U/L (ref 14–54)
ANION GAP: 10 (ref 5–15)
AST: 28 U/L (ref 15–41)
Albumin: 3.1 g/dL — ABNORMAL LOW (ref 3.5–5.0)
Alkaline Phosphatase: 103 U/L (ref 38–126)
BILIRUBIN TOTAL: 0.4 mg/dL (ref 0.3–1.2)
BUN: 14 mg/dL (ref 6–20)
CHLORIDE: 103 mmol/L (ref 101–111)
CO2: 23 mmol/L (ref 22–32)
Calcium: 8.6 mg/dL — ABNORMAL LOW (ref 8.9–10.3)
Creatinine, Ser: 1.14 mg/dL — ABNORMAL HIGH (ref 0.44–1.00)
GFR calc Af Amer: 54 mL/min — ABNORMAL LOW (ref >60)
GFR, EST NON AFRICAN AMERICAN: 47 mL/min — AB (ref >60)
GLUCOSE: 215 mg/dL — AB (ref 65–99)
POTASSIUM: 3.6 mmol/L (ref 3.5–5.1)
SODIUM: 136 mmol/L (ref 135–145)
TOTAL PROTEIN: 7.5 g/dL (ref 6.5–8.1)

## 2016-12-25 LAB — TROPONIN I

## 2016-12-25 LAB — CBG MONITORING, ED: GLUCOSE-CAPILLARY: 124 mg/dL — AB (ref 65–99)

## 2016-12-25 MED ORDER — SODIUM CHLORIDE 0.9 % IV BOLUS (SEPSIS): 1000.0000 mL | Freq: Once | INTRAVENOUS | Status: DC

## 2016-12-25 NOTE — ED Notes (Signed)
Pt has consumed one and a half cups (12 oz) of water with no difficulty.

## 2016-12-25 NOTE — ED Notes (Signed)
Ambulated patient to the outside of room. She move slow but with a steady gait while using her cane.

## 2016-12-25 NOTE — ED Notes (Signed)
Pt has consumed 2 cups of water (12 oz) and is drinking 3rd cup of water now.Pt has one restricted arm and the other is weak affected arm from CVA. Pt has poor access for IV placement.  Dr Wyvonnia Dusky made aware- okay to keep giving fluids by mouth as long as pt tolerates well. Pt tolerating well at this time.

## 2016-12-25 NOTE — Telephone Encounter (Signed)
LLE Doppler scheduled

## 2016-12-25 NOTE — ED Notes (Addendum)
This nurse spoke with the on call nurse from [place], [place], [place]. Pt is a participant of [facility] and on call nurse, Marliss Coots reports pt was seen Wednesday in the clinic for dizziness and high BP. Pt had BP medication added by PCP on Wednesday.  Dr Wyvonnia Dusky made aware of this.

## 2016-12-25 NOTE — ED Notes (Signed)
[place] scheduled for 9am tomorrow.  Spoke with pt and her son and pt will be here at [place] in the am.

## 2016-12-25 NOTE — ED Notes (Addendum)
call pt son, [name] tp udate on pt dispo and inform pt is being discharged- [name] says he is leaving [place] now to come pick up pt. Called pt daughter to inform of this per pt request.

## 2016-12-25 NOTE — Discharge Instructions (Signed)
Continue your medications as prescribed. Followup with your doctor. You may need an MRI of your brain if your symptoms persist. Return to the ED if you develop new or worsening symptoms.

## 2016-12-25 NOTE — ED Notes (Signed)
Lab and RT have attempted to draw repeat troponin in left arm without success. Dr Wyvonnia Dusky notified- verbal order received for permission to draw blood from right arm (restricted arm D/T hx of breast CA with lumpectomy).

## 2016-12-25 NOTE — ED Notes (Signed)
Pt consumed 3rd cup of water. Tolerated well.

## 2016-12-26 ENCOUNTER — Ambulatory Visit (HOSPITAL_COMMUNITY)
Admission: RE | Admit: 2016-12-26 | Discharge: 2016-12-26 | Disposition: A | Discharge status: Home / Self Care | Payer: Medicare (Managed Care) | Source: Ambulatory Visit | Attending: Emergency Medicine | Admitting: Emergency Medicine

## 2016-12-26 DIAGNOSIS — M7989 Other specified soft tissue disorders: Secondary | ICD-10-CM | POA: Insufficient documentation

## 2016-12-26 NOTE — ED Provider Notes (Signed)
[place] negative for DVT. pcp follow up.    US Venous Img Lower Unilateral Left  Result Date: 12/26/2016 CLINICAL DATA:  Chronic left lower extremity edema. EXAM: LEFT LOWER EXTREMITY VENOUS DOPPLER ULTRASOUND TECHNIQUE: Gray-scale sonography with graded compression, as well as color Doppler and duplex ultrasound were performed to evaluate the lower extremity deep venous systems from the level of the common femoral vein and including the common femoral, femoral, profunda femoral, popliteal and calf veins including the posterior tibial, peroneal and gastrocnemius veins when visible. The superficial great saphenous vein was also interrogated. Spectral Doppler was utilized to evaluate flow at rest and with distal augmentation maneuvers in the common femoral, femoral and popliteal veins. COMPARISON:  None. FINDINGS: Contralateral Common Femoral Vein: Respiratory phasicity is normal and symmetric with the symptomatic side. No evidence of thrombus. Normal compressibility. Common Femoral Vein: No evidence of thrombus. Normal compressibility, respiratory phasicity and response to augmentation. Saphenofemoral Junction: No evidence of thrombus. Normal compressibility and flow on color Doppler imaging. Profunda Femoral Vein: No evidence of thrombus. Normal compressibility and flow on color Doppler imaging. Femoral Vein: No evidence of thrombus. Normal compressibility, respiratory phasicity and response to augmentation. Popliteal Vein: No evidence of thrombus. Normal compressibility, respiratory phasicity and response to augmentation. Calf Veins: No evidence of thrombus. Normal compressibility and flow on color Doppler imaging. Superficial Great Saphenous Vein: No evidence of thrombus. Normal compressibility and flow on color Doppler imaging. Venous Reflux:  None. Other Findings:  None. IMPRESSION: No evidence of deep venous thrombosis. Electronically Signed   By: Kerby Moors M.D.   On: 12/26/2016 10:23        Jola Schmidt, MD 12/26/16 1048

## 2017-01-03 ENCOUNTER — Telehealth: Payer: Self-pay | Admitting: Hematology and Oncology

## 2017-01-03 NOTE — Telephone Encounter (Signed)
Ppt has been rescheduled w/Sonja from [facility] to 2/15 at 915am w/[place]. Aware to have the pt here at 845am in order to get checked in on time. Agreed to the appt date and time.

## 2017-01-20 ENCOUNTER — Ambulatory Visit: Payer: Medicare (Managed Care) | Admitting: Hematology and Oncology

## 2017-01-20 ENCOUNTER — Ambulatory Visit (HOSPITAL_BASED_OUTPATIENT_CLINIC_OR_DEPARTMENT_OTHER): Payer: Medicare (Managed Care) | Admitting: Hematology and Oncology

## 2017-01-20 DIAGNOSIS — C519 Malignant neoplasm of vulva, unspecified: Secondary | ICD-10-CM | POA: Insufficient documentation

## 2017-01-20 DIAGNOSIS — Z170 Estrogen receptor positive status [ER+]: Secondary | ICD-10-CM | POA: Diagnosis not present

## 2017-01-20 DIAGNOSIS — Z8542 Personal history of malignant neoplasm of other parts of uterus: Secondary | ICD-10-CM | POA: Diagnosis not present

## 2017-01-20 DIAGNOSIS — C50411 Malignant neoplasm of upper-outer quadrant of right female breast: Secondary | ICD-10-CM | POA: Diagnosis not present

## 2017-01-20 DIAGNOSIS — Z79811 Long term (current) use of aromatase inhibitors: Secondary | ICD-10-CM

## 2017-01-20 NOTE — Progress Notes (Signed)
[facility] CONSULT NOTE  Patient Care Team: No Pcp Per Patient as PCP - General (General Practice)  CHIEF COMPLAINTS/PURPOSE OF CONSULTATION:  Prior history of breast cancer, transferring care to our facility.  HISTORY OF PRESENTING ILLNESS:  Kirsten Mcmahon 71 y.o. female is here because of prior history of right breast cancer. Patient was diagnosed with will work cancer and is part of that she underwent a PET/CT scan workup. During the scan she was noted to have a right breast lesion which was further evaluated by mammogram and ultrasound. She had a 2.5 cm lesion that was biopsied and was proven to be invasive ductal carcinoma that was ER/PR positive HER-2 negative. She then underwent a lumpectomy that showed 2.5 cm tumor but one sentinel lymph node was positive. She met with radiation oncology who determined that because of her performance status she is not a candidate for adjuvant radiation. She is currently on oral letrozole therapy and appears to be tolerating it extremely well. For the vulvar cancer she underwent a vulvectomy and she appears to be in remission from that. Patient has multiple comorbidities including left hemiplegia, seizure disorder, depression, urinary incontinence, hypertension, hyperlipidemia, type 2 diabetes, obesity, glaucoma, anemia, asthma as well as lymphedema in left upper extremity. Because of all of these reasons she decided to transfer her care locally with [place]. She has chronic left-sided paralysis and chronic left arm swelling   I reviewed her records extensively and collaborated the history with the patient.  SUMMARY OF ONCOLOGIC HISTORY:   Malignant neoplasm of upper-outer quadrant of right breast in female, estrogen receptor positive ([name])   03/14/2015 Initial Diagnosis    On workup for blood work cancer PET/CT showed right breast lesion, 11:00 position 2.6 cm diameter biopsy done 05/20/2015 IDC grade 2 ER 95% PR 80% HER-2 negative      07/14/2015 Surgery    Right lumpectomy: IDC grade 2, 2.5 cm, 1/2 sentinel nodes positive, DCIS, LCIS, lateral inferior and deep margins positive for LCIS, T2 N1 M0 stage II a, radiation did not recommend adjuvant radiation ( at [facility])      08/21/2015 -  Anti-estrogen oral therapy    Letrozole 2.5 mg daily       Primary vulvar squamous cell carcinoma ([place])   03/25/2015 Surgery    Vulvectomy by Dr. [name] at [facility]; invasive squamous cell carcinoma margins are negative, did not require any further treatment       MEDICAL HISTORY:  Past Medical History:  Diagnosis Date  . Brain aneurysm   . Breast cancer ([place])   . Cancer ([place])   . Glaucoma   . High cholesterol   . Hypertension   . Seizures ([place])   . Stroke ([place])   . Thyroid mass   . Vertigo     SURGICAL HISTORY: Past Surgical History:  Procedure Laterality Date  . ABDOMINAL SURGERY    . BRAIN SURGERY     due to aneurysm  . BREAST LUMPECTOMY     right breast    SOCIAL HISTORY: Social History   Social History  . Marital status: Single    Spouse name: N/A  . Number of children: N/A  . Years of education: N/A   Occupational History  . Not on file.   Social History Main Topics  . Smoking status: Never Smoker  . Smokeless tobacco: Never Used  . Alcohol use No  . Drug use: No  . Sexual activity: Not on file  Other Topics Concern  . Not on file   Social History Narrative  . No narrative on file    FAMILY HISTORY: No family history on file.  ALLERGIES:  is allergic to levemir [insulin detemir].  MEDICATIONS:  Current Outpatient Prescriptions  Medication Sig Dispense Refill  . acetaminophen (TYLENOL) 500 MG tablet Take 500 mg by mouth every 6 (six) hours as needed for mild pain or moderate pain.    Marland Kitchen amLODipine (NORVASC) 10 MG tablet Take 10 mg by mouth daily.    Marland Kitchen aspirin EC 81 MG tablet Take 81 mg by mouth daily.    . bimatoprost (LUMIGAN) 0.01 % SOLN Place 1 drop into the left eye at  bedtime.    . hydrochlorothiazide (MICROZIDE) 12.5 MG capsule Take 12.5 mg by mouth daily.    Marland Kitchen letrozole (FEMARA) 2.5 MG tablet Take 2.5 mg by mouth daily.    Marland Kitchen losartan (COZAAR) 100 MG tablet Take 100 mg by mouth daily.    . meclizine (ANTIVERT) 12.5 MG tablet Take 12.5 mg by mouth 2 (two) times daily as needed for dizziness.    . mirabegron ER (MYRBETRIQ) 25 MG TB24 tablet Take 25 mg by mouth daily.    . polyethylene glycol powder (GLYCOLAX/MIRALAX) powder Take 17 g by mouth daily as needed for mild constipation or moderate constipation.    . ranitidine (ZANTAC) 150 MG tablet Take 150 mg by mouth 2 (two) times daily.    Marland Kitchen senna-docusate (SENNA S) 8.6-50 MG tablet Take 1 tablet by mouth daily.    . simethicone (GAS-X) 80 MG chewable tablet Chew 80 mg by mouth every 6 (six) hours as needed for flatulence.    . simvastatin (ZOCOR) 10 MG tablet Take 10 mg by mouth daily.    . Vitamin D, Ergocalciferol, (DRISDOL) 50000 units CAPS capsule Take 50,000 Units by mouth every 30 (thirty) days.     No current facility-administered medications for this visit.     REVIEW OF SYSTEMS:   Constitutional: Denies fevers, chills or abnormal night sweats Eyes: Denies blurriness of vision, double vision or watery eyes Ears, nose, mouth, throat, and face: Denies mucositis or sore throat Respiratory: Denies cough, dyspnea or wheezes Cardiovascular: Denies palpitation, chest discomfort or lower extremity swelling Gastrointestinal:  Denies nausea, heartburn or change in bowel habits Skin: Denies abnormal skin rashes Lymphatics: Denies new lymphadenopathy or easy bruising NeurologicalLeft-sided paralysis  Behavioral/Psych: Mood is stable, no new changes   All other systems were reviewed with the patient and are negative.  PHYSICAL EXAMINATION: ECOG PERFORMANCE STATUS: 3 - Symptomatic, >50% confined to bed  Vitals:   01/20/17 0947  BP: (!) 146/71  Pulse: 72  Resp: 18  Temp: 97.9 F (36.6 C)   Filed  Weights   01/20/17 0947  Weight: 165 lb 14.4 oz (75.3 kg)    GENERAL:alert, no distress and comfortable SKIN: skin color, texture, turgor are normal, no rashes or significant lesions EYES: normal, conjunctiva are pink and non-injected, sclera clear OROPHARYNX:no exudate, no erythema and lips, buccal mucosa, and tongue normal  NECK: supple, thyroid normal size, non-tender, without nodularity LYMPH:  Left arm swelling LUNGS: clear to auscultation and percussion with normal breathing effort HEART: regular rate & rhythm and no murmurs and no lower extremity edema ABDOMEN:abdomen soft, non-tender and normal bowel sounds Musculoskeletal:no cyanosis of digits and no clubbing  PSYCH: alert & oriented x 3 with fluent speech NEURO: Left-sided hemiparesis  LABORATORY DATA:  I have reviewed the data as listed Lab Results  Component Value Date   WBC 12.0 (H) 12/24/2016   HGB 12.9 12/24/2016   HCT 38.5 12/24/2016   MCV 93.2 12/24/2016   PLT 291 12/24/2016   Lab Results  Component Value Date   NA 136 12/24/2016   K 3.6 12/24/2016   CL 103 12/24/2016   CO2 23 12/24/2016    RADIOGRAPHIC STUDIES: I have personally reviewed the radiological reports and agreed with the findings in the report.  ASSESSMENT AND PLAN:  Malignant neoplasm of upper-outer quadrant of right breast in female, estrogen receptor positive ([place]) Right lumpectomy 07/14/2015: IDC grade 2, 2.5 cm, 1/2 sentinel nodes positive, DCIS, LCIS, lateral inferior and deep margins positive for LCIS, T2 N1 M0 stage II a, radiation did not recommend adjuvant radiation ( at [facility]) Current treatment: Letrozole 2.5 mg daily Previously  under the care of Dr. Leonard Downing at [facility] Patient like to transition her care to [place].   Breast cancer surveillance: Mammogram 12/09/2016: Benign appearing right breast mass at 11:00 4 cm from the nipple measuring 9 mm, breast density category C. Recheck another mammogram on the  right breast in 6 months   Letrozole toxicities: No major side effects or tolerating it Very well. Uterine cancer: In remission  Vulvar cancer: Status post resection.In remission  Left-sided stroke with left arm swelling: Chronic in nature  I offered the patient to be followed at [facility] but she prefers to come to [facility] for her annual follow-ups.   Return to clinic in  1 year  for follow-up.    All questions were answered. The patient knows to call the clinic with any problems, questions or concerns.    Rulon Eisenmenger, MD 01/20/17

## 2017-01-20 NOTE — Progress Notes (Signed)
Please call [facility] at [phone] for any concerns regarding medication refills

## 2017-01-20 NOTE — Assessment & Plan Note (Signed)
Right lumpectomy 07/14/2015: IDC grade 2, 2.5 cm, 1/2 sentinel nodes positive, DCIS, LCIS, lateral inferior and deep margins positive for LCIS, T2 N1 M0 stage II a, radiation did not recommend adjuvant radiation ( at [facility]) Current treatment: Letrozole 2.5 mg daily under the care of Dr. Leonard Downing at [facility] Letrozole toxicities: No major side effects or tolerating it.  Return to clinic in 6 months for follow-up.

## 2017-03-31 ENCOUNTER — Other Ambulatory Visit: Payer: Self-pay | Admitting: Internal Medicine

## 2017-03-31 DIAGNOSIS — E079 Disorder of thyroid, unspecified: Secondary | ICD-10-CM

## 2017-04-07 ENCOUNTER — Other Ambulatory Visit: Payer: Self-pay | Admitting: Internal Medicine

## 2017-04-07 ENCOUNTER — Ambulatory Visit
Admission: RE | Admit: 2017-04-07 | Discharge: 2017-04-07 | Disposition: A | Discharge status: Home / Self Care | Payer: Medicare (Managed Care) | Source: Ambulatory Visit | Attending: Internal Medicine | Admitting: Internal Medicine

## 2017-04-07 DIAGNOSIS — E079 Disorder of thyroid, unspecified: Secondary | ICD-10-CM

## 2017-04-07 IMAGING — CT CT NECK W/O CM
2 series · 10 of 14 positions shown, 12 images · non-contrast
Comparison: Head CT without contrast [DATE]

CLINICAL DATA: 70-year-old female with right side neck pain and
warmth for 1 month. Lymphedema in the left arm. Prior breast cancer.

EXAM:
CT NECK WITHOUT CONTRAST
TECHNIQUE: Multidetector CT imaging of the neck was performed following the
standard protocol without intravenous contrast.

[Series 3: neck · axial · 0.50mm/px · z∈[-242,-72]mm · 7 of 115 slices shown, 9 images]
[im 15/115  soft-tissue]
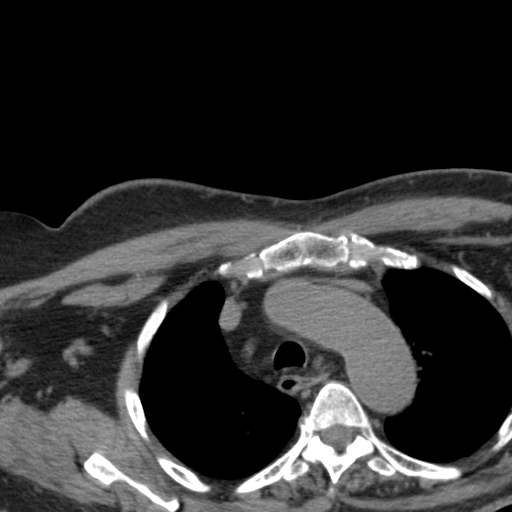
[im 15/115  bone]
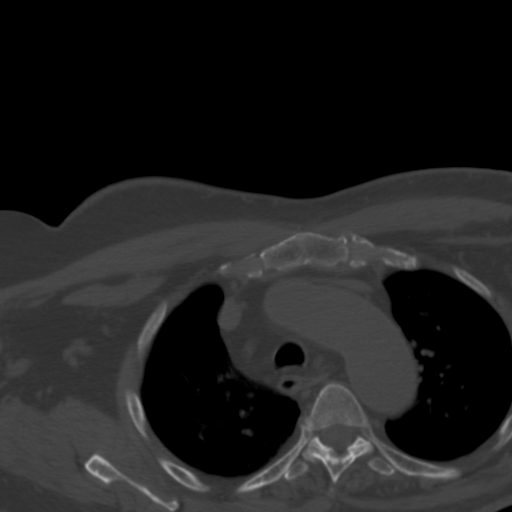
[im 29/115  bone]
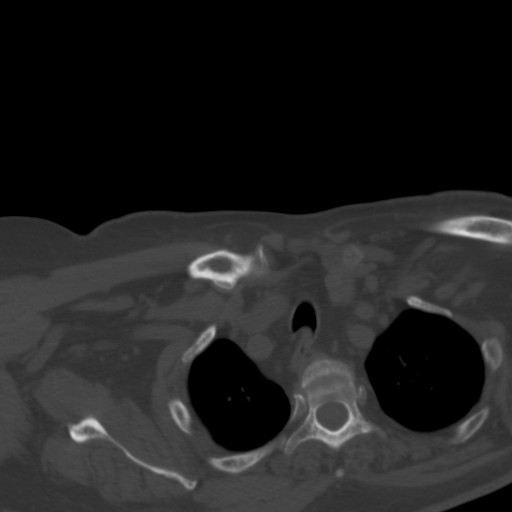
[im 43/115  bone]
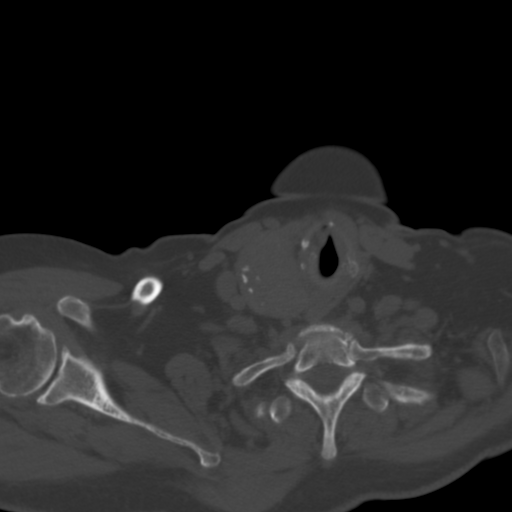
[im 58/115  bone]
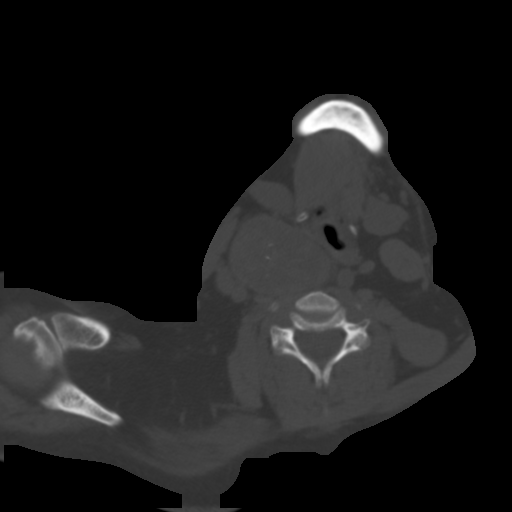
[im 72/115  soft-tissue]
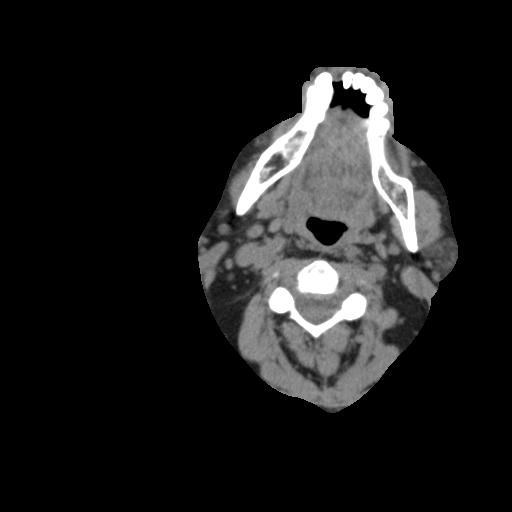
[im 72/115  bone]
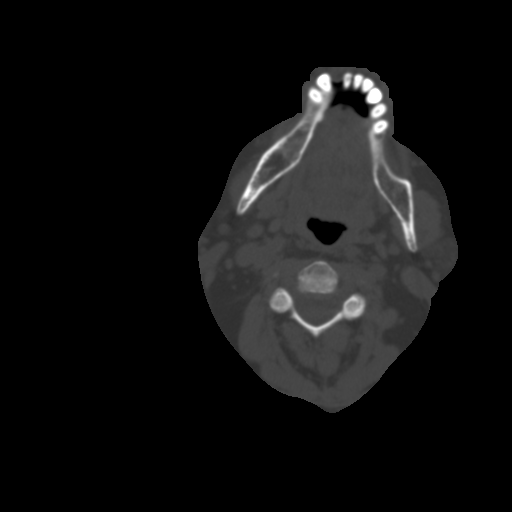
[im 86/115  bone]
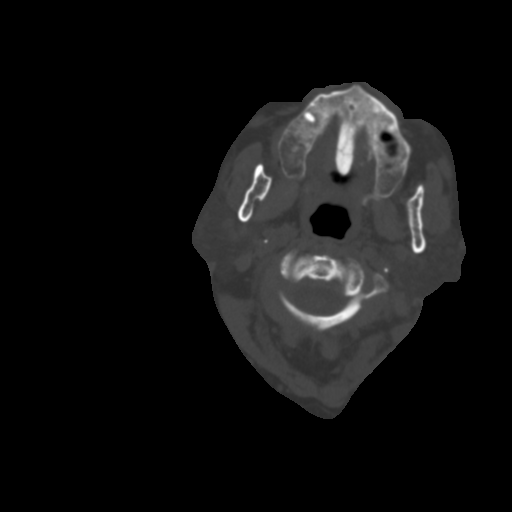
[im 100/115  bone]
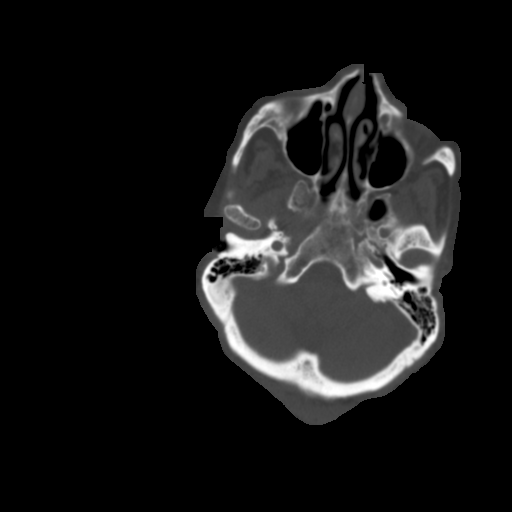

[Series 4: lungs · axial · 0.70mm/px · z∈[-265,-211]mm · 3 of 55 slices shown]
[im 14/55  bone]
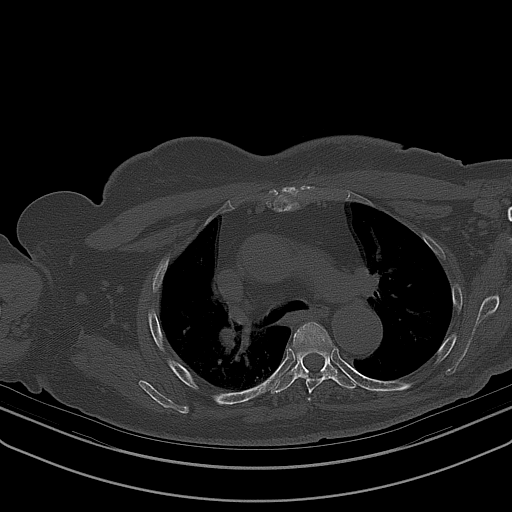
[im 28/55  bone]
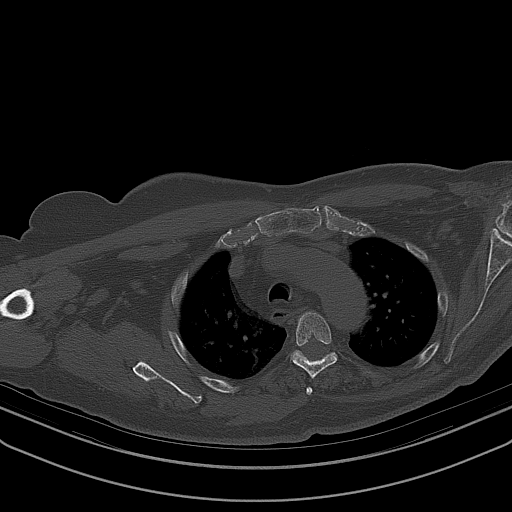
[im 41/55  bone]
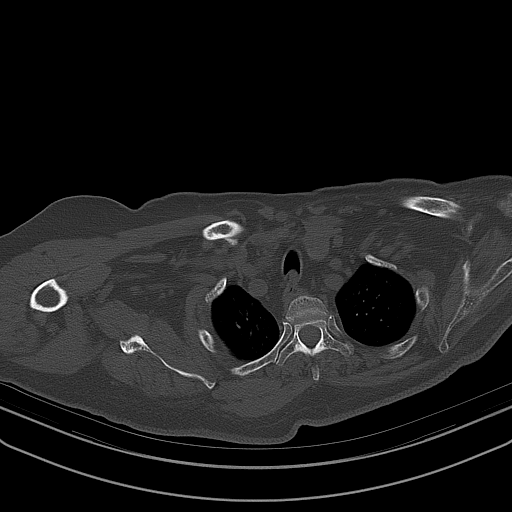

[10 of 14 positions shown; findings below may reference images not displayed]

FINDINGS: Pharynx and larynx: Mass effect on the right supraglottic larynx and
hypopharynx related to thyroid disease described below.

Asymmetric enlargement of the right laryngeal ventricle raising the
possibility of right vocal cord paralysis. The larynx otherwise is
within normal limits.

Lower retropharyngeal space remarkable for retropharyngeal position
of the left carotid and thyroid goiter.

Otherwise pharyngeal soft tissue contours are within normal limits.
Negative superior parapharyngeal and retropharyngeal spaces.

Salivary glands: Negative sublingual space. Negative submandibular
glands and parotid glands.

Thyroid: Moderate to large size right lobe thyroid goiter with
heterogeneous hypodensity and dystrophic calcifications throughout.
The enlarged right lobe measures 47 x 56 x 78 mm, and exerts
regional mass effect including on the right larynx, hypopharynx, and
lower right carotid space.

There is mild airway narrowing at the hypopharynx (series 3, image
63). No other significant airway mass effect.

The isthmus is mildly enlarged at 9 mm. The left thyroid lobe
appears normal.

Lymph nodes: No cervical lymphadenopathy. No cystic or calcified
lymph nodes.

Vascular: Vascular patency is not evaluated in the absence of IV
contrast. Retropharyngeal course of the left carotid. Calcified
atherosclerosis at the skull base.

Limited intracranial: Stable visualized noncontrast brain
parenchyma.

Visualized orbits: Interval postoperative changes to the left globe,
otherwise negative.

Mastoids and visualized paranasal sinuses: Visualized paranasal
sinuses and mastoids are stable and well pneumatized.

Skeleton: Negative.  No acute osseous abnormality identified.

Upper chest: No retrosternal extension of the right thyroid goiter.
Negative superior mediastinum aside from mild lipomatosis. Negative
lung apices. No axillary lymphadenopathy identified. Partially
visible postoperative changes to the right chest wall.
IMPRESSION: 1. Moderate to large right thyroid goiter measuring up to 7.8 cm
with an estimated volume of 103 mL. Mass effect on the right
hypopharynx and supraglottic larynx with mild narrowing of the
airway.
No lymphadenopathy or malignant features identified, but Thyroid
Ultrasound would best further characterize the gland.
2. Possible right vocal cord paralysis, which might be related to
#1.
3. Otherwise negative noncontrast neck soft tissues.

## 2017-04-07 MED ORDER — IOPAMIDOL (ISOVUE-300) INJECTION 61%: 75.0000 mL | Freq: Once | INTRAVENOUS | Status: DC | PRN

## 2017-05-04 ENCOUNTER — Other Ambulatory Visit: Payer: Self-pay | Admitting: Internal Medicine

## 2017-05-04 DIAGNOSIS — E041 Nontoxic single thyroid nodule: Secondary | ICD-10-CM

## 2017-05-12 ENCOUNTER — Ambulatory Visit
Admission: RE | Admit: 2017-05-12 | Discharge: 2017-05-12 | Disposition: A | Discharge status: Home / Self Care | Payer: No Typology Code available for payment source | Source: Ambulatory Visit | Attending: Internal Medicine | Admitting: Internal Medicine

## 2017-05-12 DIAGNOSIS — E041 Nontoxic single thyroid nodule: Secondary | ICD-10-CM

## 2017-05-13 ENCOUNTER — Other Ambulatory Visit: Payer: Self-pay | Admitting: Internal Medicine

## 2017-05-13 DIAGNOSIS — E041 Nontoxic single thyroid nodule: Secondary | ICD-10-CM

## 2017-05-18 ENCOUNTER — Ambulatory Visit
Admission: RE | Admit: 2017-05-18 | Discharge: 2017-05-18 | Disposition: A | Discharge status: Home / Self Care | Payer: No Typology Code available for payment source | Source: Ambulatory Visit | Attending: Internal Medicine | Admitting: Internal Medicine

## 2017-05-18 ENCOUNTER — Other Ambulatory Visit (HOSPITAL_COMMUNITY)
Admission: RE | Admit: 2017-05-18 | Discharge: 2017-05-18 | Disposition: A | Discharge status: Home / Self Care | Payer: Medicare (Managed Care) | Source: Ambulatory Visit | Attending: Interventional Radiology | Admitting: Interventional Radiology

## 2017-05-18 DIAGNOSIS — E041 Nontoxic single thyroid nodule: Secondary | ICD-10-CM | POA: Insufficient documentation

## 2017-06-01 ENCOUNTER — Ambulatory Visit: Payer: Self-pay | Admitting: Surgery

## 2017-07-06 NOTE — Pre-Procedure Instructions (Signed)
Kirsten Mcmahon  07/06/2017     No Pharmacies Listed   Your procedure is scheduled on July 15, 2017.  Report to [facility] Admitting at 945 AM.  Call this number if you have problems the morning of surgery:  [phone]   Remember:  Do not eat food or drink liquids after midnight.  Take these medicines the morning of surgery with A SIP OF WATER acetaminophen (tylenol), amlodipine (norvasc), bimatoprost (lumigan) eye drops, citalopram (celexa), letrozole (femara), meclizine (antivert), pred forte eye drops, isopto tears, ranitidien (zantac), senna-docusate (senna-S)  7 days prior to surgery STOP taking any Aspirin, Aleve, Naproxen, Ibuprofen, Motrin, Advil, Goody's, BC's, all herbal medications, fish oil, and all vitamins     How to Manage Your Diabetes Before and After Surgery  Why is it important to control my blood sugar before and after surgery? . Improving blood sugar levels before and after surgery helps healing and can limit problems. . A way of improving blood sugar control is eating a healthy diet by: o  Eating less sugar and carbohydrates o  Increasing activity/exercise o  Talking with your doctor about reaching your blood sugar goals . High blood sugars (greater than 180 mg/dL) can raise your risk of infections and slow your recovery, so you will need to focus on controlling your diabetes during the weeks before surgery. . Make sure that the doctor who takes care of your diabetes knows about your planned surgery including the date and location.  How do I manage my blood sugar before surgery? . Check your blood sugar at least 4 times a day, starting 2 days before surgery, to make sure that the level is not too high or low. o Check your blood sugar the morning of your surgery when you wake up and every 2 hours until you get to the Short Stay unit. . If your blood sugar is less than 70 mg/dL, you will need to treat for low blood sugar: o Do not take  insulin. o Treat a low blood sugar (less than 70 mg/dL) with  cup of clear juice (cranberry or apple), 4 glucose tablets, OR glucose gel. o Recheck blood sugar in 15 minutes after treatment (to make sure it is greater than 70 mg/dL). If your blood sugar is not greater than 70 mg/dL on recheck, call [phone] for further instructions. . Report your blood sugar to the short stay nurse when you get to Short Stay.  . If you are admitted to the hospital after surgery: o Your blood sugar will be checked by the staff and you will probably be given insulin after surgery (instead of oral diabetes medicines) to make sure you have good blood sugar levels. o The goal for blood sugar control after surgery is 80-180 mg/dL.      WHAT DO I DO ABOUT MY DIABETES MEDICATION?   Marland Kitchen Do not take oral diabetes medicines (pills) the morning of surgery (metformin/glucophage-XR).   Reviewed and Endorsed by [facility] Patient Education Committee, August 2015   Do not wear jewelry, make-up or nail polish.  Do not wear lotions, powders, or perfumes, or deoderant.  Do not shave 48 hours prior to surgery.  Men may shave face and neck.  Do not bring valuables to the hospital.  [facility] is not responsible for any belongings or valuables.  Contacts, dentures or bridgework may not be worn into surgery.  Leave your suitcase in the car.  After surgery it may be brought  to your room.  For patients admitted to the hospital, discharge time will be determined by your treatment team.  Patients discharged the day of surgery will not be allowed to drive home.    Special instructions:   [place]- Preparing For Surgery  Before surgery, you can play an important role. Because skin is not sterile, your skin needs to be as free of germs as possible. You can reduce the number of germs on your skin by washing with CHG (chlorahexidine gluconate) Soap before surgery.  CHG is an antiseptic cleaner which kills germs and bonds  with the skin to continue killing germs even after washing.  Please do not use if you have an allergy to CHG or antibacterial soaps. If your skin becomes reddened/irritated stop using the CHG.  Do not shave (including legs and underarms) for at least 48 hours prior to first CHG shower. It is OK to shave your face.  Please follow these instructions carefully.   1. Shower the NIGHT BEFORE SURGERY and the MORNING OF SURGERY with CHG.   2. If you chose to wash your hair, wash your hair first as usual with your normal shampoo.  3. After you shampoo, rinse your hair and body thoroughly to remove the shampoo.  4. Use CHG as you would any other liquid soap. You can apply CHG directly to the skin and wash gently with a scrungie or a clean washcloth.   5. Apply the CHG Soap to your body ONLY FROM THE NECK DOWN.  Do not use on open wounds or open sores. Avoid contact with your eyes, ears, mouth and genitals (private parts). Wash genitals (private parts) with your normal soap.  6. Wash thoroughly, paying special attention to the area where your surgery will be performed.  7. Thoroughly rinse your body with warm water from the neck down.  8. DO NOT shower/wash with your normal soap after using and rinsing off the CHG Soap.  9. Pat yourself dry with a CLEAN TOWEL.   10. Wear CLEAN PAJAMAS   11. Place CLEAN SHEETS on your bed the night of your first shower and DO NOT SLEEP WITH PETS.    Day of Surgery: Do not apply any deodorants/lotions. Please wear clean clothes to the hospital/surgery center.     Please read over the following fact sheets that you were given. Pain Booklet, Coughing and Deep Breathing and Surgical Site Infection Prevention

## 2017-07-07 ENCOUNTER — Encounter (HOSPITAL_COMMUNITY)
Admission: RE | Admit: 2017-07-07 | Discharge: 2017-07-07 | Disposition: A | Discharge status: Home / Self Care | Payer: Medicare (Managed Care) | Source: Ambulatory Visit | Attending: Surgery | Admitting: Surgery

## 2017-07-07 ENCOUNTER — Other Ambulatory Visit: Payer: Self-pay

## 2017-07-07 ENCOUNTER — Encounter (HOSPITAL_COMMUNITY): Payer: Self-pay

## 2017-07-07 DIAGNOSIS — J398 Other specified diseases of upper respiratory tract: Secondary | ICD-10-CM | POA: Diagnosis not present

## 2017-07-07 DIAGNOSIS — E042 Nontoxic multinodular goiter: Secondary | ICD-10-CM | POA: Diagnosis not present

## 2017-07-07 DIAGNOSIS — Z01812 Encounter for preprocedural laboratory examination: Secondary | ICD-10-CM | POA: Insufficient documentation

## 2017-07-07 DIAGNOSIS — Z01810 Encounter for preprocedural cardiovascular examination: Secondary | ICD-10-CM | POA: Insufficient documentation

## 2017-07-07 DIAGNOSIS — I517 Cardiomegaly: Secondary | ICD-10-CM | POA: Insufficient documentation

## 2017-07-07 DIAGNOSIS — Z01818 Encounter for other preprocedural examination: Secondary | ICD-10-CM

## 2017-07-07 HISTORY — DX: Gastro-esophageal reflux disease without esophagitis: K21.9

## 2017-07-07 HISTORY — DX: Major depressive disorder, single episode, unspecified: F32.9

## 2017-07-07 HISTORY — DX: Depression, unspecified: F32.A

## 2017-07-07 LAB — CBC
HEMATOCRIT: 37.8 % (ref 36.0–46.0)
HEMOGLOBIN: 12.2 g/dL (ref 12.0–15.0)
MCH: 29 pg (ref 26.0–34.0)
MCHC: 32.3 g/dL (ref 30.0–36.0)
MCV: 90 fL (ref 78.0–100.0)
Platelets: 336 10*3/uL (ref 150–400)
RBC: 4.2 MIL/uL (ref 3.87–5.11)
RDW: 13.5 % (ref 11.5–15.5)
WBC: 6.5 10*3/uL (ref 4.0–10.5)

## 2017-07-07 LAB — GLUCOSE, CAPILLARY: GLUCOSE-CAPILLARY: 161 mg/dL — AB (ref 65–99)

## 2017-07-07 LAB — BASIC METABOLIC PANEL
ANION GAP: 8 (ref 5–15)
BUN: 20 mg/dL (ref 6–20)
CHLORIDE: 103 mmol/L (ref 101–111)
CO2: 26 mmol/L (ref 22–32)
Calcium: 9.3 mg/dL (ref 8.9–10.3)
Creatinine, Ser: 1.45 mg/dL — ABNORMAL HIGH (ref 0.44–1.00)
GFR calc Af Amer: 41 mL/min — ABNORMAL LOW (ref >60)
GFR calc non Af Amer: 35 mL/min — ABNORMAL LOW (ref >60)
GLUCOSE: 139 mg/dL — AB (ref 65–99)
POTASSIUM: 3.8 mmol/L (ref 3.5–5.1)
Sodium: 137 mmol/L (ref 135–145)

## 2017-07-07 IMAGING — CR DG CHEST 2V
2 series · 2 of 2 positions shown · non-contrast
Comparison: Chest x-ray dated [DATE].

CLINICAL DATA: Preop for thyroid surgery. History of stroke.
History of diabetes, hypertension, breast cancer, seizures, GERD,
pneumonia, brain aneurysm.

EXAM:
CHEST  2 VIEW

[w chest lat]
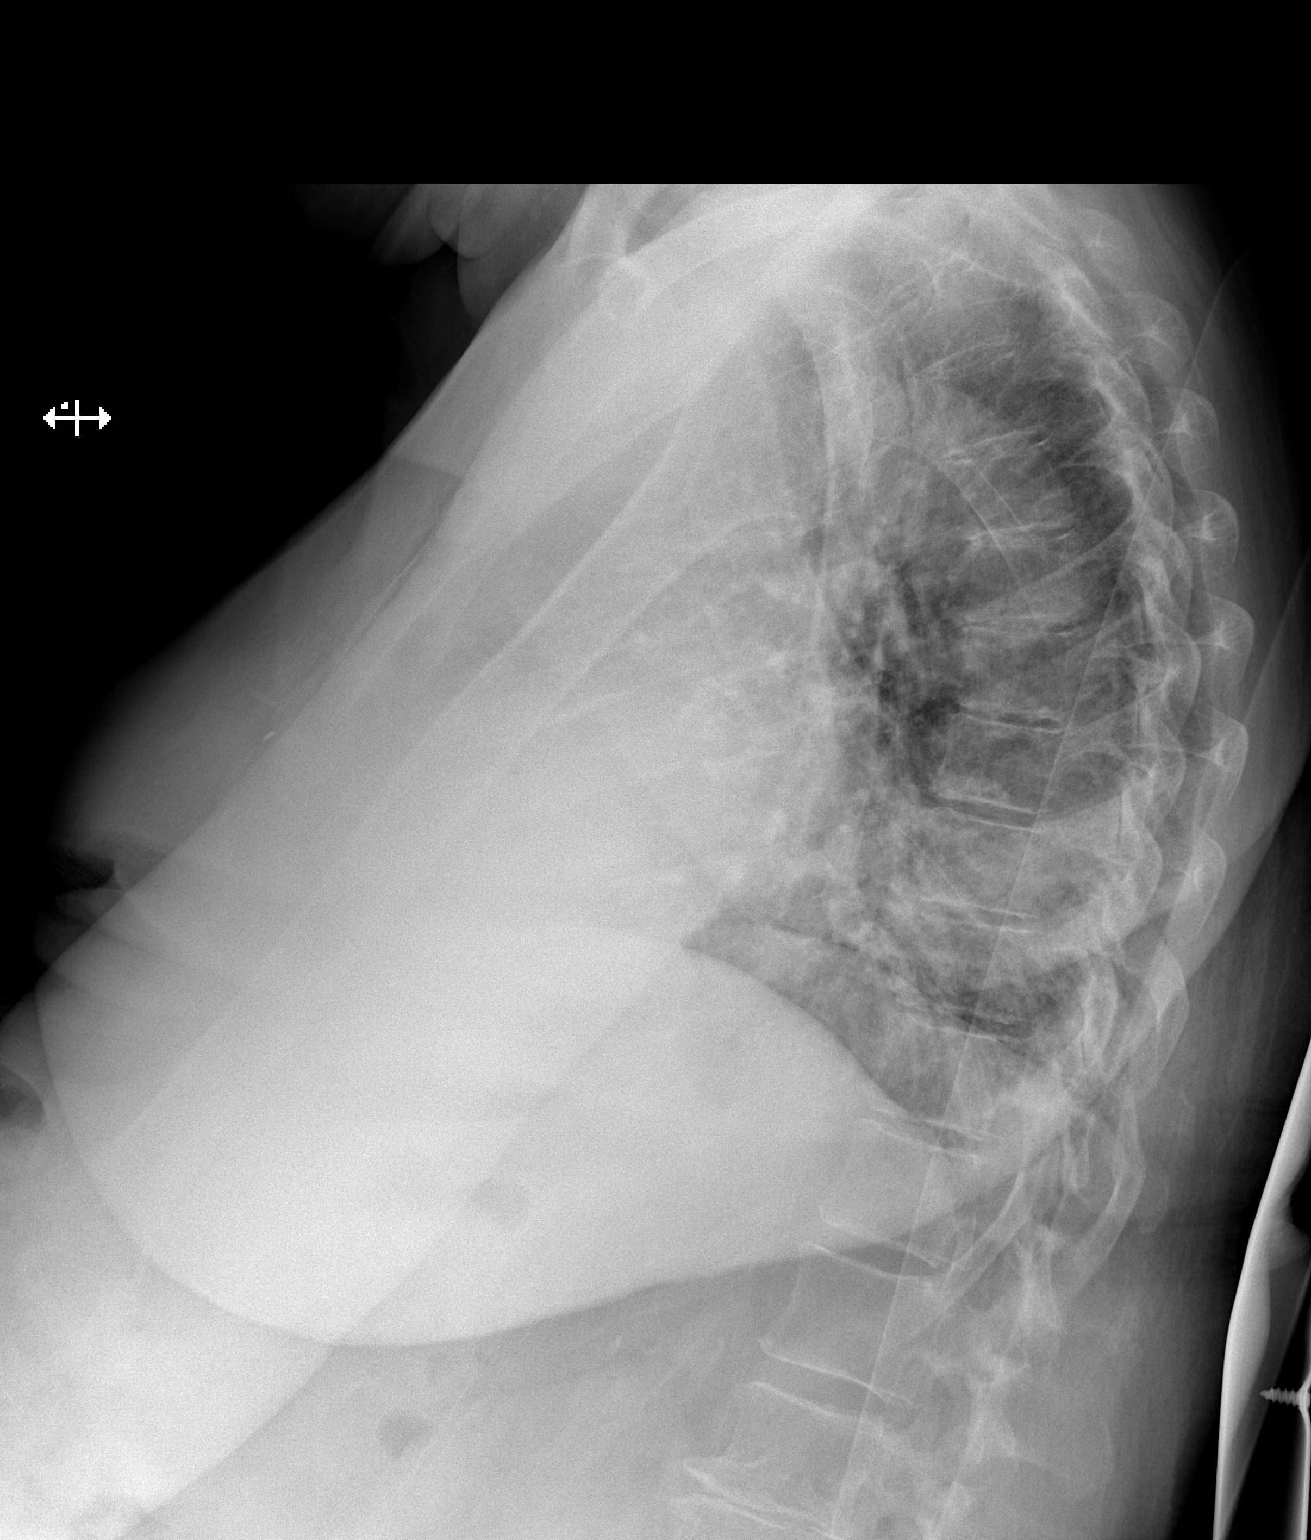

[x chest ap]
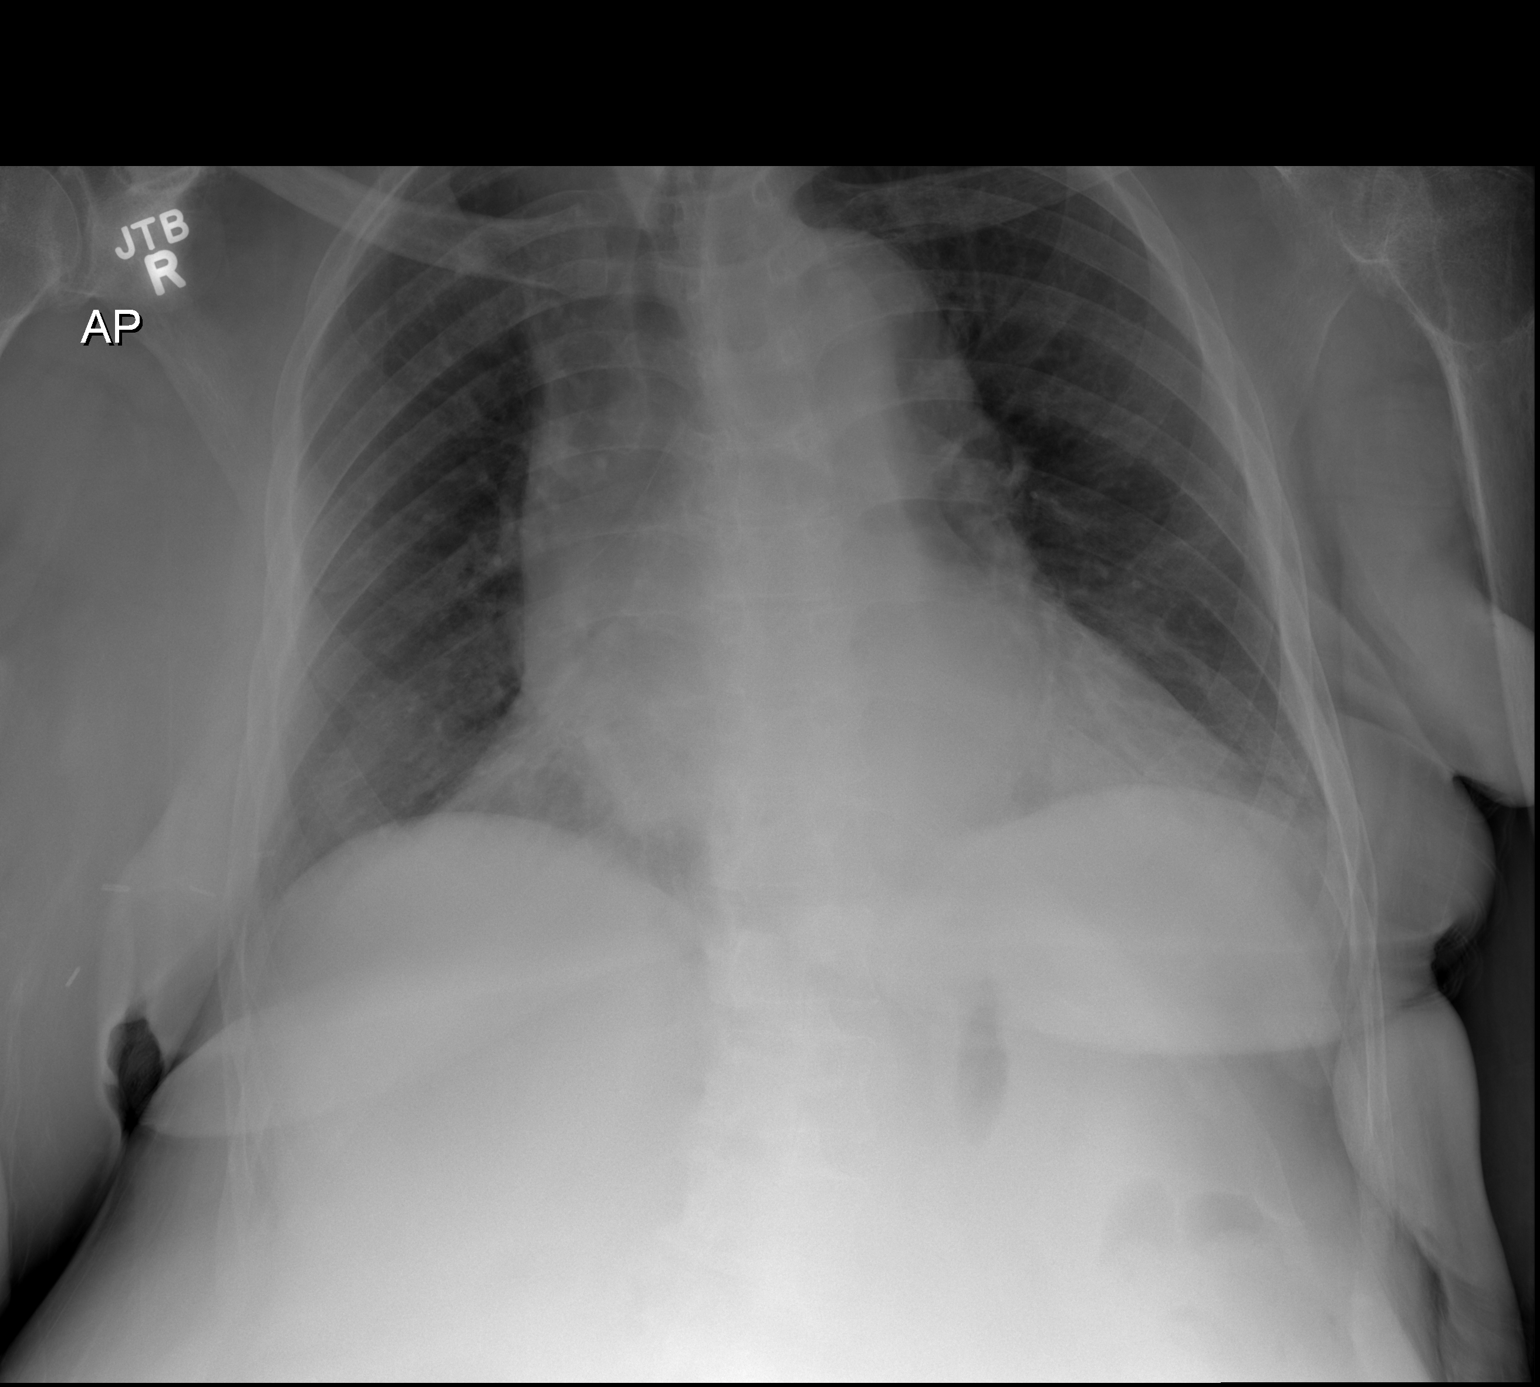

[2 of 2 positions shown; findings below may reference images not displayed]

FINDINGS: Cardiomegaly is stable. Overall cardiomediastinal silhouette is
stable, presumed age related aortic ectasia. Lungs are clear. No
pleural effusion.

No acute or suspicious osseous finding. Degenerative change noted at
the left shoulder.
IMPRESSION: No active cardiopulmonary disease. No evidence of pneumonia or
pulmonary edema.

## 2017-07-07 NOTE — Consult Note (Signed)
Anesthesiology Note:  71 year old multinodular goiter scheduled for R. Thyroid lobectomy by Dr. Harlow Asa on 07/15/17. PMH notable for CVA with L. hemiplegia in 1992, OSA, glaucoma. Pt has mild hoarseness.  CT shows R. Goiter with mild impingement on larynx and subglottic structures.   Exam: patient in wheelchair  Patient opens mouth adequately, malampati 2-3.   Based on airway exam and CT findings, it appears patient is suitable for glide scope intubation.   Roberts Gaudy

## 2017-07-07 NOTE — Progress Notes (Addendum)
Anesthesia consult:   Pt is a 71 year old female scheduled for R thyroid lobectomy on 07/15/2017 with Armandina Gemma, MD  - PCP is Barney Drain, MD - Oncologist for breast cancer is Nicholas Lose, MD  PMH includes:  HTN, DM, hyperlipidemia, stroke (1998 - left hemiplegia), brain aneurysm (s/p surgery 1992), OSA, glaucoma, breast cancer, thyroid mass, GERD. Never smoker. BMI 30.  Medications include: Amlodipine, ASA 81 mg, HCTZ, losartan, metformin, Zantac, simvastatin  BP 138/83   Pulse 90   Temp 36.7 C   Resp 20   Ht 5\' 2"  (1.575 m)   Wt 166 lb (75.3 kg)   SpO2 98%   BMI 30.36 kg/m   Preoperative labs reviewed.  - HbA1c 8.0, glucose 139 - Cr 1.45, BUN 20.  Prior results from PCP's office 02/24/17 showed Cr 1.27.   CXR 07/07/17: No active cardiopulmonary disease. No evidence of pneumonia or pulmonary edema.  EKG 07/07/17: NSR. LVH.   Thyroid US 05/12/17:  1. Diffusely heterogeneous and enlarged multinodular thyroid gland most consistent with multinodular goiter. 2. The large nodule occupying the majority of the right gland (nodule #1, TI-RADS category 4) meets consensus criteria for fine-needle aspiration biopsy. 3. The remaining TI-RADS category 3 nodules (Nodules #2 and #3) meet criteria for continued annual ultrasound follow-up until 5 year stability has been confirmed.  CT soft tissue neck 04/07/17:  1. Moderate to large right thyroid goiter measuring up to 7.8 cm with an estimated volume of 103 mL. Mass effect on the right hypopharynx and supraglottic larynx with mild narrowing of the airway. (Upper chest: No retrosternal extension of the right thyroid goiter.) No lymphadenopathy or malignant features identified, but Thyroid Ultrasound would best further characterize the gland. 2. Possible right vocal cord paralysis, which might be related to #1. 3. Otherwise negative noncontrast neck soft tissues.  Dr. Linna Caprice saw pt in PAT to evaluate airway given goiter.  He felt pt would do well  with glidescope and awake intubation would not be necessary.   If no changes, I anticipate pt can proceed with surgery as scheduled.   Willeen Cass, FNP-BC [facility]/Anesthesiology Phone: [phone] 07/08/2017 10:12 AM

## 2017-07-07 NOTE — Progress Notes (Signed)
Pt. Connected with [facility], called the clinic office & request via voicemail to send last ov note, EKG & stress test if avail. Pt. Denies chest concerns today.  Pt. Reports her voice & breathing is effected by the thyroid & has been told that it will return to normal after the pending surgery. Spoke with Willeen Cass, DNP, requesting anesth consult for tracheal deviation. It was also requested that she had a repeat EKG today.

## 2017-07-08 LAB — HEMOGLOBIN A1C
Hgb A1c MFr Bld: 8 % — ABNORMAL HIGH (ref 4.8–5.6)
MEAN PLASMA GLUCOSE: 183 mg/dL

## 2017-07-13 ENCOUNTER — Encounter (HOSPITAL_COMMUNITY): Payer: Self-pay | Admitting: Surgery

## 2017-07-13 DIAGNOSIS — J398 Other specified diseases of upper respiratory tract: Secondary | ICD-10-CM | POA: Diagnosis present

## 2017-07-13 DIAGNOSIS — E042 Nontoxic multinodular goiter: Secondary | ICD-10-CM | POA: Diagnosis present

## 2017-07-13 NOTE — H&P (Signed)
General Surgery [facility]  Kirsten Mcmahon DOB: 11-04-1946 Widowed / Language: English / Race: Black or African American Female   History of Present Illness  The patient is a 71 year old female who presents with a complaint of Enlarged thyroid.  CC: thyroid mass with tracheal deviation  Patient returns to discuss results of recent biopsy and make plans for surgery. At my request the patient underwent fine-needle aspiration biopsy of the dominant mass in the right thyroid lobe. This was performed on May 18, 2017. Final pathology shows a benign follicular nodule. Remainder of the ultrasound examination showed benign appearing thyroid nodules bilaterally. Patient does note a history of intermittent hoarseness. She surprisingly has not had significant dysphagia. She does complain of shortness of breath. She presents today accompanied by her family to discuss thyroid surgery.   Allergies  Levemir FlexPen *ANTIDIABETICS*  Itching. Allergies Reconciled   Medication History  Norvasc (5MG  Tablet, Oral) Active. Aspirin (81MG  Tablet DR, Oral) Active. Citalopram Hydrobromide (20MG  Tablet, Oral) Active. Femara (2.5MG  Tablet, Oral) Active. Simethicone (125MG  Tablet Chewable, Oral) Active. HydroCHLOROthiazide (25MG  Tablet, Oral) Active. Losartan Potassium (100MG  Tablet, Oral) Active. Lumigan (0.01% Solution, Ophthalmic) Active. Meclizine HCl (12.5MG  Tablet, Oral) Active. MiraLax (Oral) Active. Mylanta Maximum Strength (400-400-40MG /5ML Suspension, Oral) Active. Myrbetriq (25MG  Tablet ER 24HR, Oral) Active. Pred Forte (1% Suspension, Ophthalmic) Active. Ranitidine (75MG  Tablet, Oral) Active. Simvastatin (10MG  Tablet, Oral) Active. Tylenol (325MG  Tablet, Oral) Active. Vitamin D3 (2000UNIT Tablet, Oral) Active. Medications Reconciled  Vitals Weight: 155 lb Height: 62in Body Surface Area: 1.72 m Body Mass Index: 28.35 kg/m  Temp.:  97.77F  Pulse: 88 (Regular)  P.OX: 97% (Room air) BP: 150/92 (Sitting, Left Arm, Standard)   Physical Exam  The physical exam findings are as follows: Note:CONSTITUTIONAL See vital signs recorded above  GENERAL APPEARANCE Development: normal Nutritional status: normal Gross deformities: none  SKIN Rash, lesions, ulcers: none Induration, erythema: none Nodules: none palpable  EYES Conjunctiva and lids: normal Pupils: equal and reactive Iris: normal bilaterally  EARS, NOSE, MOUTH, THROAT External ears: no lesion or deformity External nose: no lesion or deformity Hearing: grossly normal Lips: no lesion or deformity Dentition: Poor Oral mucosa: moist  NECK Symmetric: no Trachea: deviation to left approx 2 cm Thyroid: Dominant firm mass occupying most of right thyroid lobe, extending beneath the clavicle on the right, nontender  CHEST Respiratory effort: normal Retraction or accessory muscle use: no Breath sounds: normal bilaterally Rales, rhonchi, wheeze: none  CARDIOVASCULAR Auscultation: regular rhythm, normal rate Murmurs: none Pulses: carotid and radial pulse 2+ palpable  LYMPHATIC Cervical: none palpable Supraclavicular: none palpable  PSYCHIATRIC Oriented to person, place, and time: yes Mood and affect: normal for situation Judgment and insight: appropriate for situation    Assessment & Plan  MULTIPLE THYROID NODULES (E04.2) TRACHEAL DEVIATION (J39.8)  Patient presents today accompanied by multiple family members. She has undergone thyroid ultrasound showing multiple thyroid nodules with a dominant mass in the right thyroid lobe. Dominant mass underwent biopsy which was benign.  Based on the above studies and clinical history, I have recommended proceeding with right thyroid lobectomy. We will assess the left thyroid lobe but not planned for complete thyroidectomy unless absolutely necessary. We discussed the procedure, the location of the  surgical incision, the risk of injury to recurrent laryngeal nerves, and the postoperative recovery. Patient and her family understand and wish to proceed with surgery in the near future.  Due to the degree of tracheal deviation and narrowing, I am  going to ask the patient to be evaluated preoperatively by anesthesiology.  The risks and benefits of the procedure have been discussed at length with the patient. The patient understands the proposed procedure, potential alternative treatments, and the course of recovery to be expected. All of the patient's questions have been answered at this time. The patient wishes to proceed with surgery.  Earnstine Regal, MD, [facility] Office: [phone]

## 2017-07-15 ENCOUNTER — Observation Stay (HOSPITAL_COMMUNITY)
Admission: RE | Admit: 2017-07-15 | Discharge: 2017-07-16 | Disposition: A | Discharge status: Home / Self Care | Payer: Medicare (Managed Care) | Source: Ambulatory Visit | Attending: Surgery | Admitting: Surgery

## 2017-07-15 ENCOUNTER — Ambulatory Visit (HOSPITAL_COMMUNITY): Payer: Medicare (Managed Care) | Admitting: Emergency Medicine

## 2017-07-15 ENCOUNTER — Encounter (HOSPITAL_COMMUNITY)
Admission: RE | Disposition: A | Discharge status: Home / Self Care | Payer: Self-pay | Source: Ambulatory Visit | Attending: Surgery

## 2017-07-15 ENCOUNTER — Encounter (HOSPITAL_COMMUNITY): Payer: Self-pay | Admitting: *Deleted

## 2017-07-15 ENCOUNTER — Ambulatory Visit (HOSPITAL_COMMUNITY): Payer: Medicare (Managed Care) | Admitting: Anesthesiology

## 2017-07-15 DIAGNOSIS — I10 Essential (primary) hypertension: Secondary | ICD-10-CM | POA: Diagnosis not present

## 2017-07-15 DIAGNOSIS — E119 Type 2 diabetes mellitus without complications: Secondary | ICD-10-CM | POA: Diagnosis not present

## 2017-07-15 DIAGNOSIS — E785 Hyperlipidemia, unspecified: Secondary | ICD-10-CM | POA: Diagnosis not present

## 2017-07-15 DIAGNOSIS — Z8679 Personal history of other diseases of the circulatory system: Secondary | ICD-10-CM | POA: Diagnosis not present

## 2017-07-15 DIAGNOSIS — H409 Unspecified glaucoma: Secondary | ICD-10-CM | POA: Diagnosis not present

## 2017-07-15 DIAGNOSIS — Z79899 Other long term (current) drug therapy: Secondary | ICD-10-CM | POA: Diagnosis not present

## 2017-07-15 DIAGNOSIS — Z853 Personal history of malignant neoplasm of breast: Secondary | ICD-10-CM | POA: Diagnosis not present

## 2017-07-15 DIAGNOSIS — I69354 Hemiplegia and hemiparesis following cerebral infarction affecting left non-dominant side: Secondary | ICD-10-CM | POA: Insufficient documentation

## 2017-07-15 DIAGNOSIS — E042 Nontoxic multinodular goiter: Secondary | ICD-10-CM | POA: Diagnosis not present

## 2017-07-15 DIAGNOSIS — J398 Other specified diseases of upper respiratory tract: Secondary | ICD-10-CM | POA: Diagnosis present

## 2017-07-15 DIAGNOSIS — Z888 Allergy status to other drugs, medicaments and biological substances status: Secondary | ICD-10-CM | POA: Diagnosis not present

## 2017-07-15 DIAGNOSIS — K219 Gastro-esophageal reflux disease without esophagitis: Secondary | ICD-10-CM | POA: Diagnosis not present

## 2017-07-15 DIAGNOSIS — Z7982 Long term (current) use of aspirin: Secondary | ICD-10-CM | POA: Diagnosis not present

## 2017-07-15 DIAGNOSIS — G4733 Obstructive sleep apnea (adult) (pediatric): Secondary | ICD-10-CM | POA: Insufficient documentation

## 2017-07-15 DIAGNOSIS — Z7984 Long term (current) use of oral hypoglycemic drugs: Secondary | ICD-10-CM | POA: Insufficient documentation

## 2017-07-15 HISTORY — PX: THYROID LOBECTOMY: SHX420

## 2017-07-15 LAB — GLUCOSE, CAPILLARY
GLUCOSE-CAPILLARY: 127 mg/dL — AB (ref 65–99)
Glucose-Capillary: 116 mg/dL — ABNORMAL HIGH (ref 65–99)
Glucose-Capillary: 126 mg/dL — ABNORMAL HIGH (ref 65–99)

## 2017-07-15 SURGERY — LOBECTOMY, THYROID
Anesthesia: General | Site: Neck | Laterality: Right

## 2017-07-15 MED ORDER — PHENYLEPHRINE HCL 10 MG/ML IJ SOLN
INTRAVENOUS | Status: DC | PRN
  Administered 2017-07-15: 20 ug/min via INTRAVENOUS

## 2017-07-15 MED ORDER — CEFAZOLIN SODIUM-DEXTROSE 2-4 GM/100ML-% IV SOLN
2.0000 g | INTRAVENOUS | Status: AC
  Administered 2017-07-15: 2 g via INTRAVENOUS

## 2017-07-15 MED ORDER — FENTANYL CITRATE (PF) 100 MCG/2ML IJ SOLN
25.0000 ug | INTRAMUSCULAR | Status: DC | PRN
  Administered 2017-07-15: 50 ug via INTRAVENOUS

## 2017-07-15 MED ORDER — HYDROCODONE-ACETAMINOPHEN 5-325 MG PO TABS: 1.0000 | ORAL_TABLET | ORAL | 0 refills | Status: AC | PRN

## 2017-07-15 MED ORDER — DEXAMETHASONE SODIUM PHOSPHATE 10 MG/ML IJ SOLN
INTRAMUSCULAR | Status: DC | PRN
  Administered 2017-07-15: 10 mg via INTRAVENOUS

## 2017-07-15 MED ORDER — HYDROCHLOROTHIAZIDE 25 MG PO TABS
25.0000 mg | ORAL_TABLET | Freq: Every day | ORAL | Status: DC
  Administered 2017-07-16: 25 mg via ORAL
  Filled 2017-07-15: qty 1

## 2017-07-15 MED ORDER — 0.9 % SODIUM CHLORIDE (POUR BTL) OPTIME
TOPICAL | Status: DC | PRN
  Administered 2017-07-15: 1000 mL

## 2017-07-15 MED ORDER — ONDANSETRON 4 MG PO TBDP: 4.0000 mg | ORAL_TABLET | Freq: Four times a day (QID) | ORAL | Status: DC | PRN

## 2017-07-15 MED ORDER — SUGAMMADEX SODIUM 500 MG/5ML IV SOLN
INTRAVENOUS | Status: AC
  Filled 2017-07-15: qty 5

## 2017-07-15 MED ORDER — PROPOFOL 10 MG/ML IV BOLUS
INTRAVENOUS | Status: DC | PRN
  Administered 2017-07-15: 100 mg via INTRAVENOUS

## 2017-07-15 MED ORDER — FENTANYL CITRATE (PF) 100 MCG/2ML IJ SOLN
INTRAMUSCULAR | Status: DC | PRN
  Administered 2017-07-15: 50 ug via INTRAVENOUS
  Administered 2017-07-15: 100 ug via INTRAVENOUS
  Administered 2017-07-15 (×4): 50 ug via INTRAVENOUS

## 2017-07-15 MED ORDER — KCL IN DEXTROSE-NACL 20-5-0.45 MEQ/L-%-% IV SOLN
INTRAVENOUS | Status: DC
Start: 2017-07-15 — End: 2017-07-16
  Administered 2017-07-15: 18:00:00 via INTRAVENOUS
  Filled 2017-07-15: qty 1000

## 2017-07-15 MED ORDER — HYDROMORPHONE HCL 1 MG/ML IJ SOLN
INTRAMUSCULAR | Status: AC
  Administered 2017-07-15: 1 mg via INTRAVENOUS
  Filled 2017-07-15: qty 1

## 2017-07-15 MED ORDER — LIDOCAINE 2% (20 MG/ML) 5 ML SYRINGE
INTRAMUSCULAR | Status: AC
  Filled 2017-07-15: qty 10

## 2017-07-15 MED ORDER — FENTANYL CITRATE (PF) 100 MCG/2ML IJ SOLN
INTRAMUSCULAR | Status: AC
  Filled 2017-07-15: qty 2

## 2017-07-15 MED ORDER — PHENYLEPHRINE HCL 10 MG/ML IJ SOLN
INTRAMUSCULAR | Status: DC | PRN
  Administered 2017-07-15: 120 ug via INTRAVENOUS

## 2017-07-15 MED ORDER — HEMOSTATIC AGENTS (NO CHARGE) OPTIME
TOPICAL | Status: DC | PRN
  Administered 2017-07-15: 1 via TOPICAL

## 2017-07-15 MED ORDER — ROCURONIUM BROMIDE 100 MG/10ML IV SOLN
INTRAVENOUS | Status: DC | PRN
  Administered 2017-07-15: 50 mg via INTRAVENOUS

## 2017-07-15 MED ORDER — ACETAMINOPHEN 325 MG PO TABS: 650.0000 mg | ORAL_TABLET | Freq: Four times a day (QID) | ORAL | Status: DC | PRN

## 2017-07-15 MED ORDER — LATANOPROST 0.005 % OP SOLN
1.0000 [drp] | Freq: Every day | OPHTHALMIC | Status: DC
  Administered 2017-07-15: 1 [drp] via OPHTHALMIC
  Filled 2017-07-15: qty 2.5

## 2017-07-15 MED ORDER — DEXAMETHASONE SODIUM PHOSPHATE 10 MG/ML IJ SOLN
INTRAMUSCULAR | Status: AC
  Filled 2017-07-15: qty 3

## 2017-07-15 MED ORDER — POLYVINYL ALCOHOL 1.4 % OP SOLN
1.0000 [drp] | Freq: Three times a day (TID) | OPHTHALMIC | Status: DC
  Administered 2017-07-15 – 2017-07-16 (×2): 1 [drp] via OPHTHALMIC
  Filled 2017-07-15: qty 15

## 2017-07-15 MED ORDER — LIDOCAINE HCL (CARDIAC) 20 MG/ML IV SOLN
INTRAVENOUS | Status: DC | PRN
  Administered 2017-07-15: 75 mg via INTRAVENOUS

## 2017-07-15 MED ORDER — SUCCINYLCHOLINE CHLORIDE 200 MG/10ML IV SOSY
PREFILLED_SYRINGE | INTRAVENOUS | Status: AC
  Filled 2017-07-15: qty 10

## 2017-07-15 MED ORDER — EPHEDRINE 5 MG/ML INJ
INTRAVENOUS | Status: AC
  Filled 2017-07-15: qty 10

## 2017-07-15 MED ORDER — PHENYLEPHRINE 40 MCG/ML (10ML) SYRINGE FOR IV PUSH (FOR BLOOD PRESSURE SUPPORT)
PREFILLED_SYRINGE | INTRAVENOUS | Status: AC
  Filled 2017-07-15: qty 10

## 2017-07-15 MED ORDER — MEPERIDINE HCL 25 MG/ML IJ SOLN: 6.2500 mg | INTRAMUSCULAR | Status: DC | PRN

## 2017-07-15 MED ORDER — PREDNISOLONE ACETATE 1 % OP SUSP
1.0000 [drp] | Freq: Three times a day (TID) | OPHTHALMIC | Status: DC
  Administered 2017-07-15 – 2017-07-16 (×3): 1 [drp] via OPHTHALMIC
  Filled 2017-07-15: qty 5

## 2017-07-15 MED ORDER — CHLORHEXIDINE GLUCONATE CLOTH 2 % EX PADS: 6.0000 | MEDICATED_PAD | Freq: Once | CUTANEOUS | Status: DC

## 2017-07-15 MED ORDER — LACTATED RINGERS IV SOLN
INTRAVENOUS | Status: DC
  Administered 2017-07-15: 09:00:00 via INTRAVENOUS

## 2017-07-15 MED ORDER — METFORMIN HCL ER 500 MG PO TB24
500.0000 mg | ORAL_TABLET | Freq: Every day | ORAL | Status: DC
  Administered 2017-07-16: 500 mg via ORAL
  Filled 2017-07-15: qty 1

## 2017-07-15 MED ORDER — LETROZOLE 2.5 MG PO TABS
2.5000 mg | ORAL_TABLET | Freq: Every day | ORAL | Status: DC
  Administered 2017-07-16: 2.5 mg via ORAL
  Filled 2017-07-15 (×2): qty 1

## 2017-07-15 MED ORDER — CITALOPRAM HYDROBROMIDE 20 MG PO TABS
20.0000 mg | ORAL_TABLET | Freq: Every day | ORAL | Status: DC
  Administered 2017-07-16: 20 mg via ORAL
  Filled 2017-07-15: qty 1

## 2017-07-15 MED ORDER — MIDAZOLAM HCL 2 MG/2ML IJ SOLN: 0.5000 mg | Freq: Once | INTRAMUSCULAR | Status: DC | PRN

## 2017-07-15 MED ORDER — PROPOFOL 10 MG/ML IV BOLUS
INTRAVENOUS | Status: AC
  Filled 2017-07-15: qty 20

## 2017-07-15 MED ORDER — PROMETHAZINE HCL 25 MG/ML IJ SOLN: 6.2500 mg | INTRAMUSCULAR | Status: DC | PRN

## 2017-07-15 MED ORDER — HYDROCODONE-ACETAMINOPHEN 5-325 MG PO TABS
1.0000 | ORAL_TABLET | ORAL | Status: DC | PRN
  Administered 2017-07-16: 1 via ORAL
  Administered 2017-07-16: 2 via ORAL
  Filled 2017-07-15: qty 1
  Filled 2017-07-15: qty 2

## 2017-07-15 MED ORDER — FENTANYL CITRATE (PF) 250 MCG/5ML IJ SOLN
INTRAMUSCULAR | Status: AC
  Filled 2017-07-15: qty 5

## 2017-07-15 MED ORDER — HYDROMORPHONE HCL 1 MG/ML IJ SOLN
1.0000 mg | INTRAMUSCULAR | Status: DC | PRN
  Administered 2017-07-15 – 2017-07-16 (×3): 1 mg via INTRAVENOUS
  Filled 2017-07-15 (×2): qty 1

## 2017-07-15 MED ORDER — CALCIUM CARBONATE 1250 (500 CA) MG PO TABS
2.0000 | ORAL_TABLET | Freq: Three times a day (TID) | ORAL | Status: DC
  Administered 2017-07-15 – 2017-07-16 (×3): 1000 mg via ORAL
  Filled 2017-07-15 (×3): qty 1

## 2017-07-15 MED ORDER — ONDANSETRON HCL 4 MG/2ML IJ SOLN
INTRAMUSCULAR | Status: DC | PRN
  Administered 2017-07-15: 4 mg via INTRAVENOUS

## 2017-07-15 MED ORDER — SUGAMMADEX SODIUM 200 MG/2ML IV SOLN
INTRAVENOUS | Status: DC | PRN
  Administered 2017-07-15: 140 mg via INTRAVENOUS

## 2017-07-15 MED ORDER — ONDANSETRON HCL 4 MG/2ML IJ SOLN
4.0000 mg | Freq: Four times a day (QID) | INTRAMUSCULAR | Status: DC | PRN
  Administered 2017-07-16: 4 mg via INTRAVENOUS
  Filled 2017-07-15: qty 2

## 2017-07-15 MED ORDER — AMLODIPINE BESYLATE 5 MG PO TABS
5.0000 mg | ORAL_TABLET | Freq: Every day | ORAL | Status: DC
  Administered 2017-07-16: 5 mg via ORAL
  Filled 2017-07-15: qty 1

## 2017-07-15 MED ORDER — LOSARTAN POTASSIUM 50 MG PO TABS
100.0000 mg | ORAL_TABLET | Freq: Every day | ORAL | Status: DC
  Administered 2017-07-16: 100 mg via ORAL
  Filled 2017-07-15: qty 2

## 2017-07-15 MED ORDER — ONDANSETRON HCL 4 MG/2ML IJ SOLN
INTRAMUSCULAR | Status: AC
  Filled 2017-07-15: qty 6

## 2017-07-15 MED ORDER — ACETAMINOPHEN 650 MG RE SUPP: 650.0000 mg | Freq: Four times a day (QID) | RECTAL | Status: DC | PRN

## 2017-07-15 SURGICAL SUPPLY — 53 items
ATTRACTOMAT 16X20 MAGNETIC DRP (DRAPES) ×3 IMPLANT
BLADE CLIPPER SURG (BLADE) IMPLANT
BLADE SURG 10 STRL SS (BLADE) ×3 IMPLANT
BLADE SURG 15 STRL LF DISP TIS (BLADE) ×1 IMPLANT
BLADE SURG 15 STRL SS (BLADE) ×3
CANISTER SUCT 3000ML PPV (MISCELLANEOUS) ×3 IMPLANT
CHLORAPREP W/TINT 10.5 ML (MISCELLANEOUS) ×3 IMPLANT
CLIP VESOCCLUDE MED 24/CT (CLIP) ×3 IMPLANT
CLIP VESOCCLUDE SM WIDE 24/CT (CLIP) ×3 IMPLANT
CLOSURE STERI-STRIP 1/4X4 (GAUZE/BANDAGES/DRESSINGS) ×2 IMPLANT
CLOSURE WOUND 1/2 X4 (GAUZE/BANDAGES/DRESSINGS) ×1
CONT SPEC 4OZ CLIKSEAL STRL BL (MISCELLANEOUS) IMPLANT
COVER SURGICAL LIGHT HANDLE (MISCELLANEOUS) ×3 IMPLANT
CRADLE DONUT ADULT HEAD (MISCELLANEOUS) ×3 IMPLANT
DRAPE LAPAROTOMY 100X72 PEDS (DRAPES) ×3 IMPLANT
DRAPE UTILITY XL STRL (DRAPES) ×3 IMPLANT
ELECT CAUTERY BLADE 6.4 (BLADE) ×3 IMPLANT
ELECT REM PT RETURN 9FT ADLT (ELECTROSURGICAL) ×3
ELECTRODE REM PT RTRN 9FT ADLT (ELECTROSURGICAL) ×1 IMPLANT
GAUZE SPONGE 4X4 12PLY STRL (GAUZE/BANDAGES/DRESSINGS) ×3 IMPLANT
GAUZE SPONGE 4X4 16PLY XRAY LF (GAUZE/BANDAGES/DRESSINGS) ×5 IMPLANT
GLOVE BIOGEL PI IND STRL 6 (GLOVE) IMPLANT
GLOVE BIOGEL PI IND STRL 7.0 (GLOVE) IMPLANT
GLOVE BIOGEL PI INDICATOR 6 (GLOVE) ×2
GLOVE BIOGEL PI INDICATOR 7.0 (GLOVE) ×4
GLOVE ECLIPSE 6.5 STRL STRAW (GLOVE) ×2 IMPLANT
GLOVE SURG ORTHO 8.0 STRL STRW (GLOVE) ×3 IMPLANT
GLOVE SURG SS PI 7.0 STRL IVOR (GLOVE) ×4 IMPLANT
GOWN STRL REUS W/ TWL LRG LVL3 (GOWN DISPOSABLE) ×1 IMPLANT
GOWN STRL REUS W/ TWL XL LVL3 (GOWN DISPOSABLE) ×1 IMPLANT
GOWN STRL REUS W/TWL LRG LVL3 (GOWN DISPOSABLE) ×9
GOWN STRL REUS W/TWL XL LVL3 (GOWN DISPOSABLE) ×3
HEMOSTAT SURGICEL 2X4 FIBR (HEMOSTASIS) ×3 IMPLANT
ILLUMINATOR WAVEGUIDE N/F (MISCELLANEOUS) ×3 IMPLANT
KIT BASIN OR (CUSTOM PROCEDURE TRAY) ×3 IMPLANT
KIT ROOM TURNOVER OR (KITS) ×3 IMPLANT
NS IRRIG 1000ML POUR BTL (IV SOLUTION) ×3 IMPLANT
PACK SURGICAL SETUP 50X90 (CUSTOM PROCEDURE TRAY) ×3 IMPLANT
PAD ARMBOARD 7.5X6 YLW CONV (MISCELLANEOUS) ×3 IMPLANT
PENCIL BUTTON HOLSTER BLD 10FT (ELECTRODE) ×3 IMPLANT
SHEARS HARMONIC 9CM CVD (BLADE) ×3 IMPLANT
SPECIMEN JAR MEDIUM (MISCELLANEOUS) IMPLANT
SPONGE INTESTINAL PEANUT (DISPOSABLE) ×3 IMPLANT
STRIP CLOSURE SKIN 1/2X4 (GAUZE/BANDAGES/DRESSINGS) ×2 IMPLANT
SUT MNCRL AB 4-0 PS2 18 (SUTURE) ×3 IMPLANT
SUT SILK 2 0 (SUTURE) ×3
SUT SILK 2-0 18XBRD TIE 12 (SUTURE) ×1 IMPLANT
SUT VIC AB 3-0 SH 18 (SUTURE) ×5 IMPLANT
SYR BULB 3OZ (MISCELLANEOUS) ×3 IMPLANT
TOWEL OR 17X24 6PK STRL BLUE (TOWEL DISPOSABLE) ×3 IMPLANT
TOWEL OR 17X26 10 PK STRL BLUE (TOWEL DISPOSABLE) ×3 IMPLANT
TUBE CONNECTING 12'X1/4 (SUCTIONS) ×1
TUBE CONNECTING 12X1/4 (SUCTIONS) ×2 IMPLANT

## 2017-07-15 NOTE — Discharge Instructions (Signed)
[facility] ° °THYROID & PARATHYROID SURGERY:  POST-OP INSTRUCTIONS ° °Always review your discharge instruction sheet from the facility where your surgery was performed. ° °A prescription for pain medication may be given to you upon discharge.  Take your pain medication as prescribed.  If narcotic pain medicine is not needed, then you may take acetaminophen (Tylenol) or ibuprofen (Advil) as needed. ° °Take your usually prescribed medications unless otherwise directed. ° °If you need a refill on your pain medication, please contact our office during regular business hours.  Prescriptions will not be processed by our office after 5 pm or on weekends. ° °Start with a light diet upon arrival home, such as soup and crackers or toast.  Be sure to drink plenty of fluids daily.  Resume your normal diet the day after surgery. ° °Most patients will experience some swelling and bruising on the chest and neck area.  Ice packs will help.  Swelling and bruising can take several days to resolve.  ° °It is common to experience some constipation after surgery.  Increasing fluid intake and taking a stool softener (Colace) will usually help or prevent this problem.  A mild laxative (Milk of Magnesia or Miralax) should be taken according to package directions if there has been no bowel movement after 48 hours. ° °You have steri-strips and a gauze dressing over your incision.  You may remove the gauze bandage on the second day after surgery, and you may shower at that time.  Leave your steri-strips (small skin tapes) in place directly over the incision.  These strips should remain on the skin for 5-7 days and then be removed.  You may get them wet in the shower and pat them dry. ° °You may resume regular (light) daily activities beginning the next day - such as daily self-care, walking, climbing stairs - gradually increasing activities as tolerated.  You may have sexual intercourse when it is comfortable.  Refrain  from any heavy lifting or straining until approved by your doctor.  You may drive when you no longer are taking prescription pain medication, you can comfortably wear a seatbelt, and you can safely maneuver your car and apply brakes. ° °You should see your doctor in the office for a follow-up appointment approximately three weeks after your surgery.  Make sure that you call for this appointment within a day or two after you arrive home to insure a convenient appointment time. ° °WHEN TO CALL YOUR DOCTOR: °-- Fever greater than 101.5 °-- Inability to urinate °-- Nausea and/or vomiting - persistent °-- Extreme swelling or bruising °-- Continued bleeding from incision °-- Increased pain, redness, or drainage from the incision °-- Difficulty swallowing or breathing °-- Muscle cramping or spasms °-- Numbness or tingling in hands or around lips ° °The clinic staff is available to answer your questions during regular business hours.  Please don’t hesitate to call and ask to speak to one of the nurses if you have concerns. ° °Sulo Janczak M. Jaz Mallick, MD, FACS °General & Endocrine Surgery °[facility] °Office: [phone] ° °Website: [email or web address] ° ° °

## 2017-07-15 NOTE — Anesthesia Preprocedure Evaluation (Signed)
Anesthesia Evaluation  Patient identified by MRN, date of birth, ID band Patient awake    Reviewed: Allergy & Precautions, NPO status , Patient's Chart, lab work & pertinent test results  History of Anesthesia Complications Negative for: history of anesthetic complications  Airway Mallampati: II  TM Distance: >3 FB Neck ROM: Full Positive for:  Tracheal deviation   Dental  (+) Poor Dentition, Dental Advisory Given, Chipped, Missing   Pulmonary sleep apnea (no longer requires CPAP) ,    breath sounds clear to auscultation       Cardiovascular hypertension, Pt. on medications (-) angina Rhythm:Regular Rate:Normal     Neuro/Psych Depression CVA (cerebral aneurysm: L hemiparesis), Residual Symptoms    GI/Hepatic Neg liver ROS, GERD  Medicated and Controlled,  Endo/Other  diabetes (glu 126), Oral Hypoglycemic AgentsMorbid obesity  Renal/GU Renal InsufficiencyRenal disease (creat 1.45)     Musculoskeletal   Abdominal   Peds  Hematology   Anesthesia Other Findings Breast cancer  Reproductive/Obstetrics                             Anesthesia Physical Anesthesia Plan  ASA: III  Anesthesia Plan: General   Post-op Pain Management:    Induction: Intravenous  PONV Risk Score and Plan: 4 or greater and Ondansetron, Dexamethasone and Treatment may vary due to age or medical condition  Airway Management Planned: Oral ETT  Additional Equipment:   Intra-op Plan:   Post-operative Plan: Extubation in OR  Informed Consent: I have reviewed the patients History and Physical, chart, labs and discussed the procedure including the risks, benefits and alternatives for the proposed anesthesia with the patient or authorized representative who has indicated his/her understanding and acceptance.   Dental advisory given  Plan Discussed with: CRNA and Surgeon  Anesthesia Plan Comments: (Plan routine  monitors, GETA)        Anesthesia Quick Evaluation

## 2017-07-15 NOTE — Anesthesia Procedure Notes (Addendum)
Procedure Name: Intubation Date/Time: 07/15/2017 12:28 PM Performed by: Salli Quarry Ivionna Verley Pre-anesthesia Checklist: Patient identified, Emergency Drugs available, Suction available and Patient being monitored Patient Re-evaluated:Patient Re-evaluated prior to induction Oxygen Delivery Method: Circle System Utilized Preoxygenation: Pre-oxygenation with 100% oxygen Induction Type: IV induction Ventilation: Mask ventilation without difficulty and Oral airway inserted - appropriate to patient size Laryngoscope Size: Mac and 3 Grade View: Grade I Tube type: Oral Tube size: 7.0 mm Number of attempts: 1 (DL x2 by CRNA) Airway Equipment and Method: Stylet and Oral airway Placement Confirmation: ETT inserted through vocal cords under direct vision,  positive ETCO2 and breath sounds checked- equal and bilateral Secured at: 22 cm Tube secured with: Tape Dental Injury: Teeth and Oropharynx as per pre-operative assessment  Comments: DLx2 by CRNA, 1st attempt with MAC 3 - grade 2 view with deviated angle of glottis and unable to pass ETT, 2nd attempt with MAC 3- glottis suctioned, grade 3 view, immediate recognition of esophageal intubation. DL x3 by MD -  grade1 view with MAC 3, atraumatic intubation

## 2017-07-15 NOTE — Anesthesia Postprocedure Evaluation (Signed)
Anesthesia Post Note  Patient: Kirsten Mcmahon  Procedure(s) Performed: Procedure(s) (LRB): RIGHT THYROID LOBECTOMY (Right)     Patient location during evaluation: PACU Anesthesia Type: General Level of consciousness: patient cooperative, oriented and sedated Pain management: pain level controlled Vital Signs Assessment: post-procedure vital signs reviewed and stable Respiratory status: spontaneous breathing, nonlabored ventilation, respiratory function stable and patient connected to nasal cannula oxygen Cardiovascular status: blood pressure returned to baseline and stable Postop Assessment: no signs of nausea or vomiting Anesthetic complications: no    Last Vitals:  Vitals:   07/15/17 1455 07/15/17 1513  BP: (!) 145/86 (!) 141/82  Pulse: 97 88  Resp: 13 17  Temp:    SpO2: 97% 95%    Last Pain:  Vitals:   07/15/17 1513  TempSrc:   PainSc: Asleep                 Annessa Satre,E. Ariell Gunnels

## 2017-07-15 NOTE — Interval H&P Note (Signed)
History and Physical Interval Note:  07/15/2017 11:48 AM  Kirsten Mcmahon  has presented today for surgery, with the diagnosis of Multiple thyroid nodules, tracheal deviation  The various methods of treatment have been discussed with the patient and family. After consideration of risks, benefits and other options for treatment, the patient has consented to    Procedure(s): RIGHT THYROID LOBECTOMY (Right) as a surgical intervention .    The patient's history has been reviewed, patient examined, no change in status, stable for surgery.  I have reviewed the patient's chart and labs.  Questions were answered to the patient's satisfaction.    Earnstine Regal, MD, [facility] Office: [place]

## 2017-07-15 NOTE — Transfer of Care (Signed)
Immediate Anesthesia Transfer of Care Note  Patient: Kirsten Mcmahon  Procedure(s) Performed: Procedure(s): RIGHT THYROID LOBECTOMY (Right)  Patient Location: PACU  Anesthesia Type:General  Level of Consciousness: awake, alert  and patient cooperative  Airway & Oxygen Therapy: Patient Spontanous Breathing and Patient connected to nasal cannula oxygen  Post-op Assessment: Report given to RN and Post -op Vital signs reviewed and stable  Post vital signs: Reviewed and stable  Last Vitals:  Vitals:   07/15/17 0840 07/15/17 1412  BP: (!) 164/74 (!) 189/102  Pulse: 67 96  Resp: 20 10  Temp: 36.9 C 37 C  SpO2: 98% 95%    Last Pain:  Vitals:   07/15/17 0840  TempSrc: Oral      Patients Stated Pain Goal: 2 (63/49/49 4473)  Complications: No apparent anesthesia complications

## 2017-07-15 NOTE — Op Note (Signed)
Procedure Note  Pre-operative Diagnosis:  Multiple right thyroid nodules, tracheal deviation  Post-operative Diagnosis:  same  Surgeon:  Earnstine Regal, MD, FACS  Assistant:  none   Procedure:  Right thyroid lobectomy  Anesthesia:  General  Estimated Blood Loss:  minimal  Drains: none         Specimen: thyroid lobe to pathology  Indications:  The patient is a 71 year old female who presents with a complaint of Enlarged thyroid.  Patient returns to discuss results of recent biopsy and make plans for surgery. At my request the patient underwent fine-needle aspiration biopsy of the dominant mass in the right thyroid lobe. This was performed on May 18, 2017. Final pathology shows a benign follicular nodule. Remainder of the ultrasound examination showed benign appearing thyroid nodules bilaterally. Patient does note a history of intermittent hoarseness. She surprisingly has not had significant dysphagia. She does complain of shortness of breath. She presents today for right thyroid lobectomy.  Procedure Details: Procedure was done in OR #8 at the [facility].  The patient was brought to the operating room and placed in a supine position on the operating room table.  Following administration of general anesthesia, the patient was positioned and then prepped and draped in the usual aseptic fashion.  After ascertaining that an adequate level of anesthesia had been achieved, a Kocher incision was made with #15 blade.  Dissection was carried through subcutaneous tissues and platysma. Hemostasis was achieved with the electrocautery.  Skin flaps were elevated cephalad and caudad from the thyroid notch to the sternal notch.  A self-retaining retractor was placed for exposure.  Strap muscles were incised in the midline and dissection was begun on the right side.  Strap muscles were reflected laterally.  The right thyroid lobe was markedly enlarged, multinodular, and somewhat adherent to  surrounding tissues consistent with inflammatory changes.  The lobe was gently mobilized with blunt dissection.  Superior pole vessels were dissected out and divided individually between small and medium Ligaclips with the Harmonic scalpel.  The thyroid lobe was rolled anteriorly.  Branches of the inferior thyroid artery were divided between small Ligaclips with the Harmonic scalpel.  Inferior venous tributaries were divided between Ligaclips.  Both the superior and inferior parathyroid glands were identified and preserved on their vascular pedicles.  The recurrent laryngeal nerve was identified and preserved along its course.  The ligament of Gwenlyn Found was released with the electrocautery and the gland was mobilized onto the anterior trachea. Isthmus was mobilized across the midline.  There was no pyramidal lobe present.  The thyroid parenchyma was transected at the junction of the isthmus and contralateral thyroid lobe with the Harmonic scalpel.  The thyroid lobe and isthmus were submitted to pathology for review.  The neck was irrigated with warm saline.  Fibular was placed throughout the operative field.  Strap muscles were reapproximated in the midline with interrupted 3-0 Vicryl sutures.  Platysma was closed with interrupted 3-0 Vicryl sutures.  Skin was closed with a running 4-0 Monocryl subcuticular suture.  Wound was washed and dried and steri-strips were applied.  Dry gauze dressing was placed.  The patient was awakened from anesthesia and brought to the recovery room.  The patient tolerated the procedure well.   Earnstine Regal, MD, [facility] Office: [phone]

## 2017-07-16 ENCOUNTER — Encounter (HOSPITAL_COMMUNITY): Payer: Self-pay | Admitting: *Deleted

## 2017-07-16 DIAGNOSIS — E042 Nontoxic multinodular goiter: Secondary | ICD-10-CM | POA: Diagnosis not present

## 2017-07-16 LAB — BASIC METABOLIC PANEL
ANION GAP: 9 (ref 5–15)
BUN: 20 mg/dL (ref 6–20)
CALCIUM: 8.7 mg/dL — AB (ref 8.9–10.3)
CO2: 23 mmol/L (ref 22–32)
Chloride: 101 mmol/L (ref 101–111)
Creatinine, Ser: 1.44 mg/dL — ABNORMAL HIGH (ref 0.44–1.00)
GFR, EST AFRICAN AMERICAN: 41 mL/min — AB (ref >60)
GFR, EST NON AFRICAN AMERICAN: 35 mL/min — AB (ref >60)
GLUCOSE: 246 mg/dL — AB (ref 65–99)
POTASSIUM: 4.5 mmol/L (ref 3.5–5.1)
SODIUM: 133 mmol/L — AB (ref 135–145)

## 2017-07-16 MED ORDER — HYDROCORTISONE 0.5 % EX CREA
TOPICAL_CREAM | Freq: Four times a day (QID) | CUTANEOUS | Status: DC | PRN
  Administered 2017-07-16: 1 via TOPICAL
  Filled 2017-07-16: qty 28.35

## 2017-07-16 MED ORDER — DIPHENHYDRAMINE HCL 25 MG PO CAPS
25.0000 mg | ORAL_CAPSULE | Freq: Four times a day (QID) | ORAL | Status: DC | PRN
  Administered 2017-07-16 (×2): 25 mg via ORAL
  Filled 2017-07-16 (×3): qty 1

## 2017-07-16 MED ORDER — DIPHENHYDRAMINE HCL 25 MG PO CAPS
25.0000 mg | ORAL_CAPSULE | Freq: Once | ORAL | Status: AC
  Administered 2017-07-16: 25 mg via ORAL

## 2017-07-16 NOTE — Care Management Note (Signed)
Case Management Note  Patient Details  Name: Kirsten Mcmahon MRN: [id number] Date of Birth: 02-26-1946  Subjective/Objective:                 Faxed Dc summ to [facility]. Patient with order to DC to home today. Chart reviewed. No Home Health or Equipment needs, no unacknowledged Case Management consults or medication needs identified at the time of this note. Plan for DC to home. If needs arise today prior to discharge, please call Carles Collet RN CM at [phone].    Action/Plan:   Expected Discharge Date:  07/16/17               Expected Discharge Plan:  Home/Self Care  In-House Referral:     Discharge planning Services  CM Consult  Post Acute Care Choice:    Choice offered to:     DME Arranged:    DME Agency:     HH Arranged:    HH Agency:     Status of Service:  Completed, signed off  If discussed at [employer] of Stay Meetings, dates discussed:    Additional Comments:  Carles Collet, RN 07/16/2017, 3:51 PM

## 2017-07-16 NOTE — Progress Notes (Signed)
I spoke with her son [name] he said he still at work and he can pick her up around 3-4 pm Charge Nurse aware.

## 2017-07-16 NOTE — Progress Notes (Signed)
Pt for discharge going home, dressing done on her neck, given pain meds and zofran, apply hydrocortisone cream in her feet, next appointment, prescriptions, health teachings, all personal belongings given, discontinue IV fluid and peripheral IV line. No s/s of distress noted.

## 2017-07-16 NOTE — Progress Notes (Signed)
Pt discharged going home escorted by Nurse Tech via wheelchair with her son and grand kids, given  Benadryl, 02sat 99% room air, removed the peripheral IV line , dressing dry and intact over her neck, health teachings given to her son too, prescriptions given, no s/s of distress noted.

## 2017-07-16 NOTE — Discharge Summary (Signed)
Physician Discharge Summary  Patient ID: Kirsten Mcmahon MRN: [id number] DOB/AGE: 1946-07-11 71 y.o.  Admit date: 07/15/2017 Discharge date: 07/16/2017  Admission Diagnoses: MNG  Discharge Diagnoses:  Principal Problem:   Multiple thyroid nodules Active Problems:   Tracheal deviation   Discharged Condition: good  Hospital Course: admitted for overnight observation.  Pt did well  Consults: None  Significant Diagnostic Studies: labs: chemistry  Treatments: surgery: R thyroid lobectomy  Discharge Exam: Blood pressure 118/74, pulse 74, temperature 98.4 F (36.9 C), temperature source Oral, resp. rate 17, height 5\' 2"  (1.575 m), weight 75.3 kg (166 lb), SpO2 97 %. Head: Normocephalic, without obvious abnormality, atraumatic Incision/Wound: clean, dry ,intact  Disposition: 01-Home or Self Care   Allergies as of 07/16/2017      Reactions   Levemir [insulin Detemir] Anaphylaxis      Medication List    TAKE these medications   acetaminophen 325 MG tablet Commonly known as:  TYLENOL Take 650 mg by mouth every 4 (four) hours as needed (for pain.).   alum & mag hydroxide-simeth 009-381-82 MG/5ML suspension Commonly known as:  MAALOX PLUS Take 5 mLs by mouth every 6 (six) hours as needed for indigestion.   amLODipine 5 MG tablet Commonly known as:  NORVASC Take 5 mg by mouth daily.   aspirin EC 81 MG tablet Take 81 mg by mouth daily.   bimatoprost 0.01 % Soln Commonly known as:  LUMIGAN Place 1 drop into the left eye at bedtime.   citalopram 20 MG tablet Commonly known as:  CELEXA Take 20 mg by mouth daily.   hydrochlorothiazide 25 MG tablet Commonly known as:  HYDRODIURIL Take 25 mg by mouth daily.   HYDROcodone-acetaminophen 5-325 MG tablet Commonly known as:  NORCO/VICODIN Take 1 tablet by mouth every 4 (four) hours as needed for moderate pain.   hydroxypropyl methylcellulose / hypromellose 2.5 % ophthalmic solution Commonly known as:  ISOPTO TEARS /  GONIOVISC Place 1 drop into both eyes 3 (three) times daily.   letrozole 2.5 MG tablet Commonly known as:  FEMARA Take 2.5 mg by mouth daily.   losartan 100 MG tablet Commonly known as:  COZAAR Take 100 mg by mouth daily.   meclizine 12.5 MG tablet Commonly known as:  ANTIVERT Take 12.5 mg by mouth 2 (two) times daily as needed for dizziness.   metFORMIN 500 MG 24 hr tablet Commonly known as:  GLUCOPHAGE-XR Take 500 mg by mouth daily.   MYRBETRIQ 25 MG Tb24 tablet Generic drug:  mirabegron ER Take 25 mg by mouth daily.   polyethylene glycol powder powder Commonly known as:  GLYCOLAX/MIRALAX Take 17 g by mouth daily as needed (for constipation).   prednisoLONE acetate 1 % ophthalmic suspension Commonly known as:  PRED FORTE Place 1 drop into the left eye 3 (three) times daily.   ranitidine 75 MG tablet Commonly known as:  ZANTAC Take 75 mg by mouth 2 (two) times daily.   SENNA S 8.6-50 MG tablet Generic drug:  senna-docusate Take 2 tablets by mouth at bedtime.   simethicone 125 MG chewable tablet Commonly known as:  MYLICON Chew 993 mg by mouth every 6 (six) hours as needed (for gas discomfort).   simvastatin 10 MG tablet Commonly known as:  ZOCOR Take 10 mg by mouth at bedtime.   Vitamin D3 2000 units Tabs Take 2,000 Units by mouth daily.      Follow-up Information    Armandina Gemma, MD. Schedule an appointment as soon as possible for  a visit in 3 week(s).   Specialty:  General Surgery Why:  For wound re-check Contact information: [place] [address] [phone]           Signed: Rosario Adie 05/27/6332, 5:45 AM

## 2017-07-17 NOTE — Progress Notes (Signed)
Time of this note:  07/17/17  1508: DC packet found on dept on the bench at the end of the hall.  [name] (pt's son) Cuda called and made aware that packet and Rx for Vicodin was here.  He was very appreciative and stated he had been looking all over for it.  He will come to the front desk and pick up the DC packet and Rx.

## 2017-07-19 NOTE — Progress Notes (Signed)
Please contact patient and notify of benign pathology results.  Jacqlyn Marolf M. Tylek Boney, MD, FACS [facility] Office: [phone]   

## 2017-07-21 NOTE — Progress Notes (Signed)
Please contact patient and notify of benign pathology results.  Grey Rakestraw M. Kailiana Granquist, MD, FACS [facility] Office: [phone]   

## 2017-09-13 ENCOUNTER — Other Ambulatory Visit: Payer: Self-pay | Admitting: Nurse Practitioner

## 2017-09-13 ENCOUNTER — Ambulatory Visit
Admission: RE | Admit: 2017-09-13 | Discharge: 2017-09-13 | Disposition: A | Discharge status: Home / Self Care | Payer: Medicare (Managed Care) | Source: Ambulatory Visit | Attending: Nurse Practitioner | Admitting: Nurse Practitioner

## 2017-09-13 DIAGNOSIS — R52 Pain, unspecified: Secondary | ICD-10-CM

## 2017-09-13 IMAGING — CR DG SHOULDER 2+V*L*
3 series · 3 of 3 positions shown · non-contrast
Comparison: None.

CLINICAL DATA: 71-year-old female status post fall 1 week ago with
persistent pain.

EXAM:
LEFT SHOULDER - 2+ VIEW

[w shoulder ap internal left]
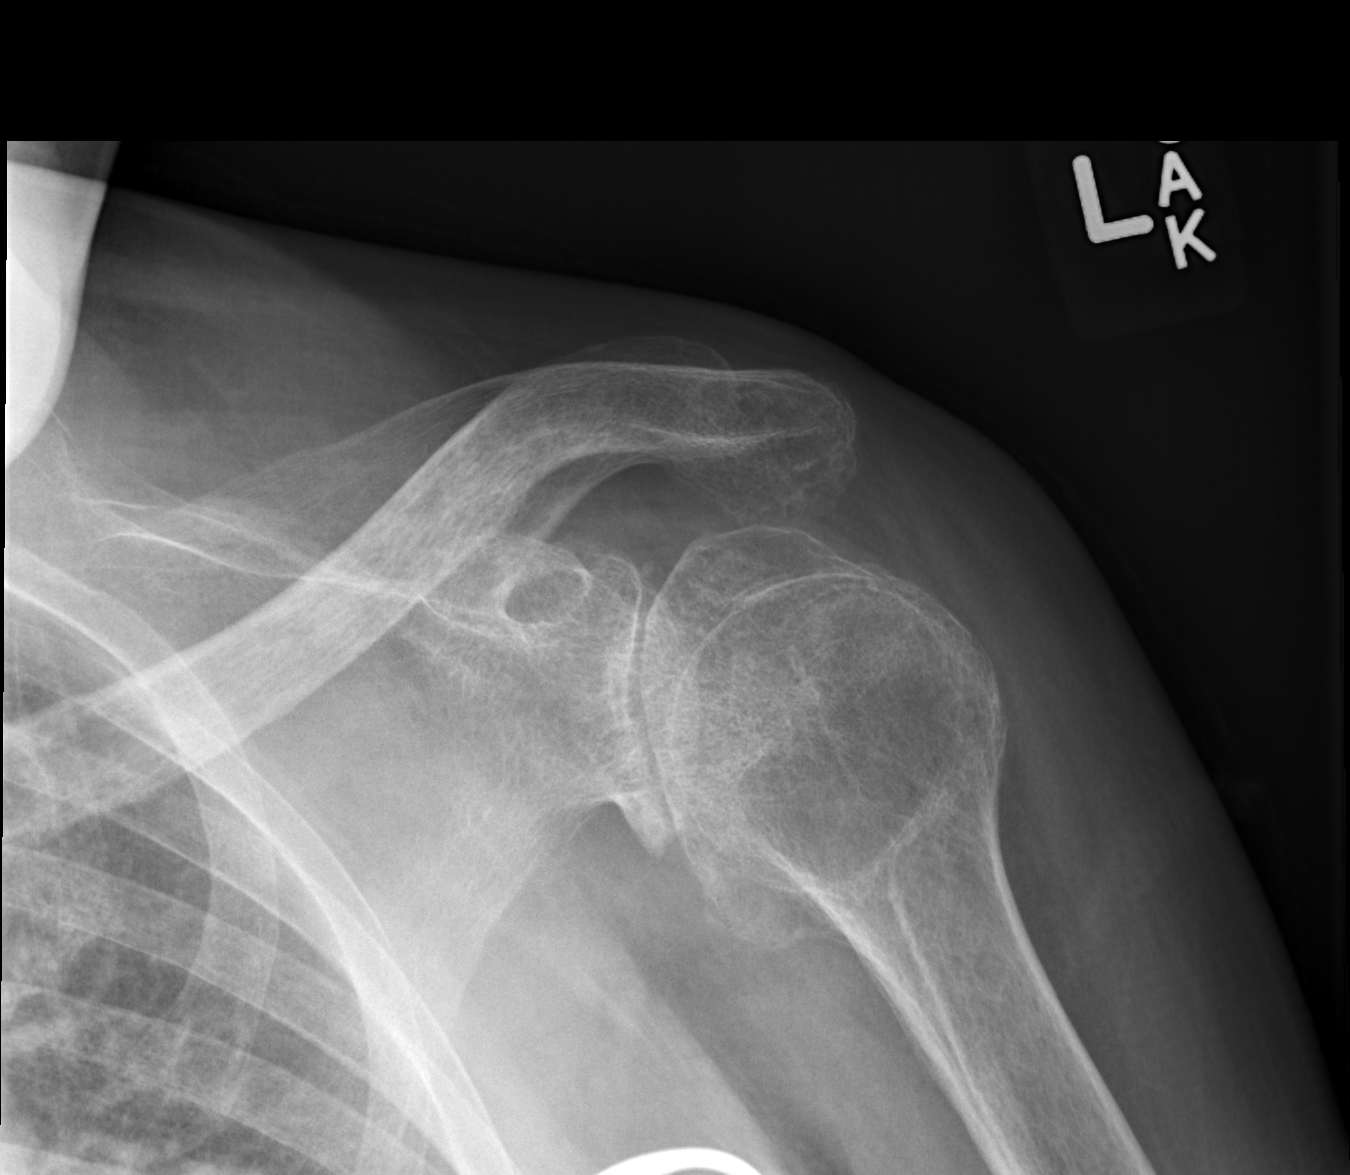

[w shoulder ap external left *]
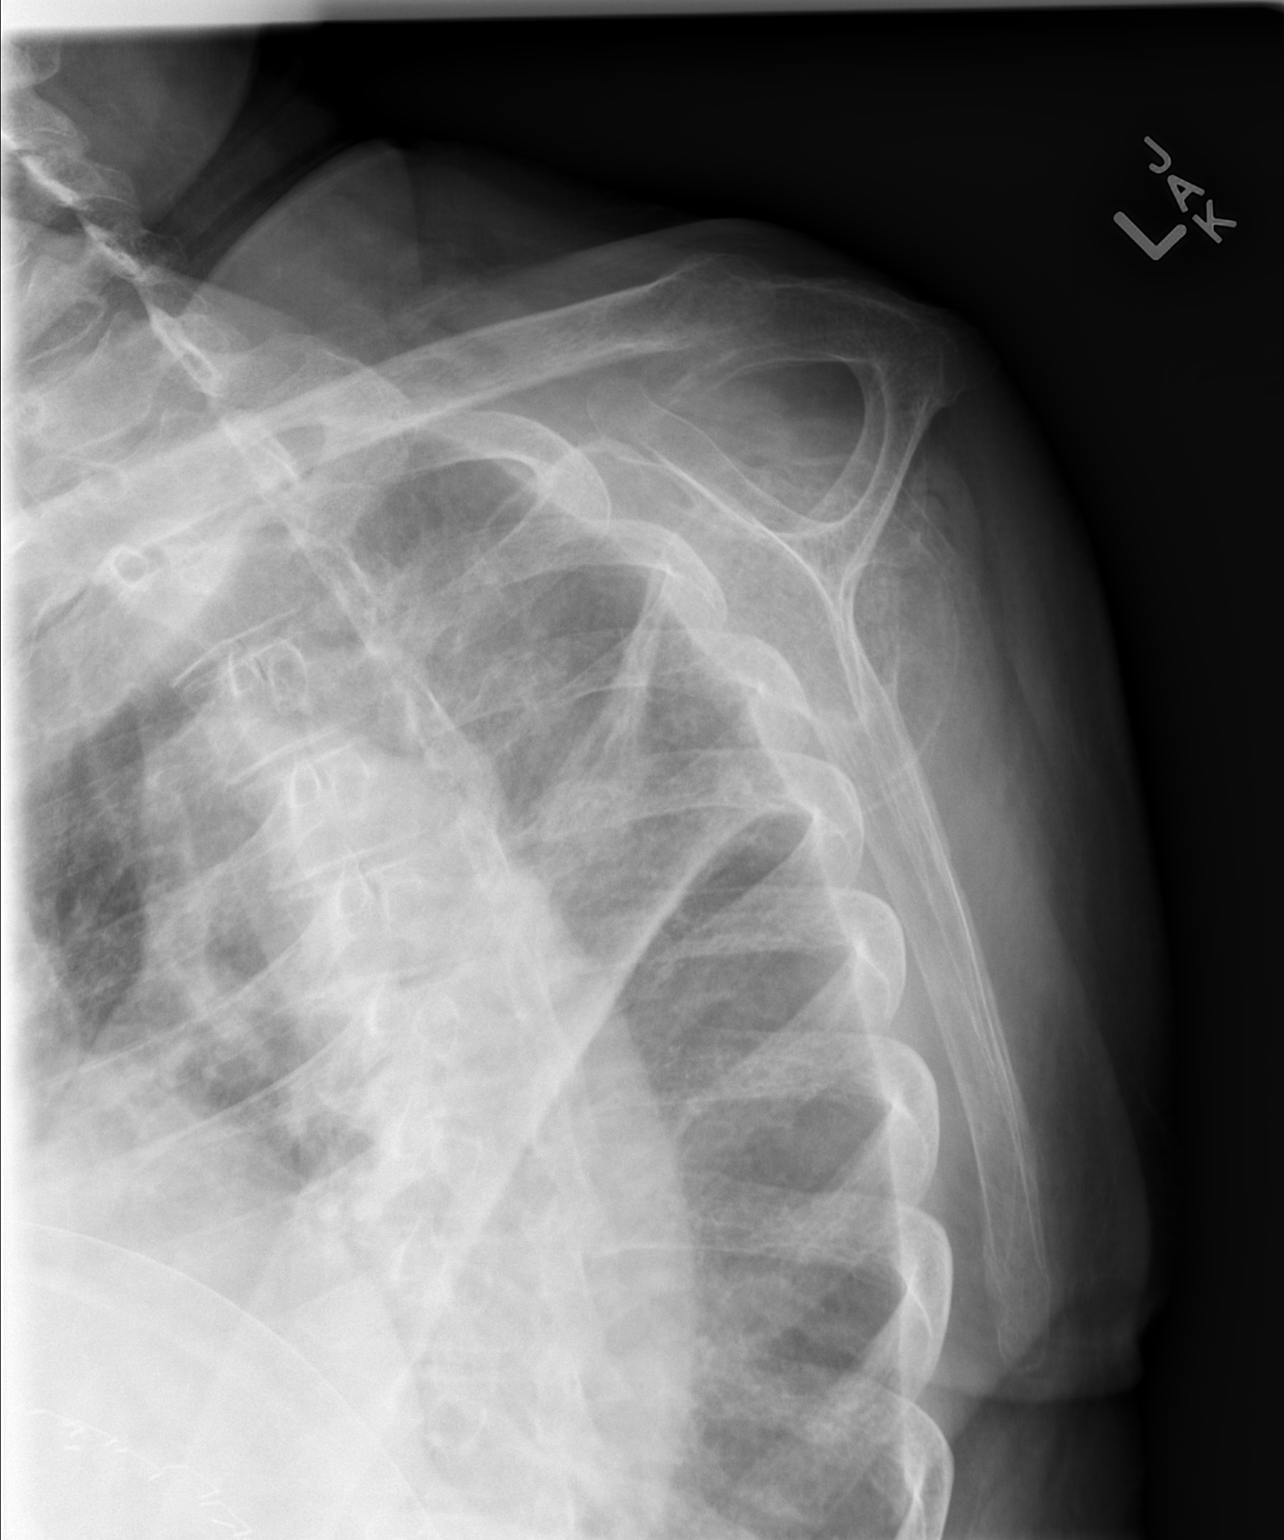

[w shoulder y view left]
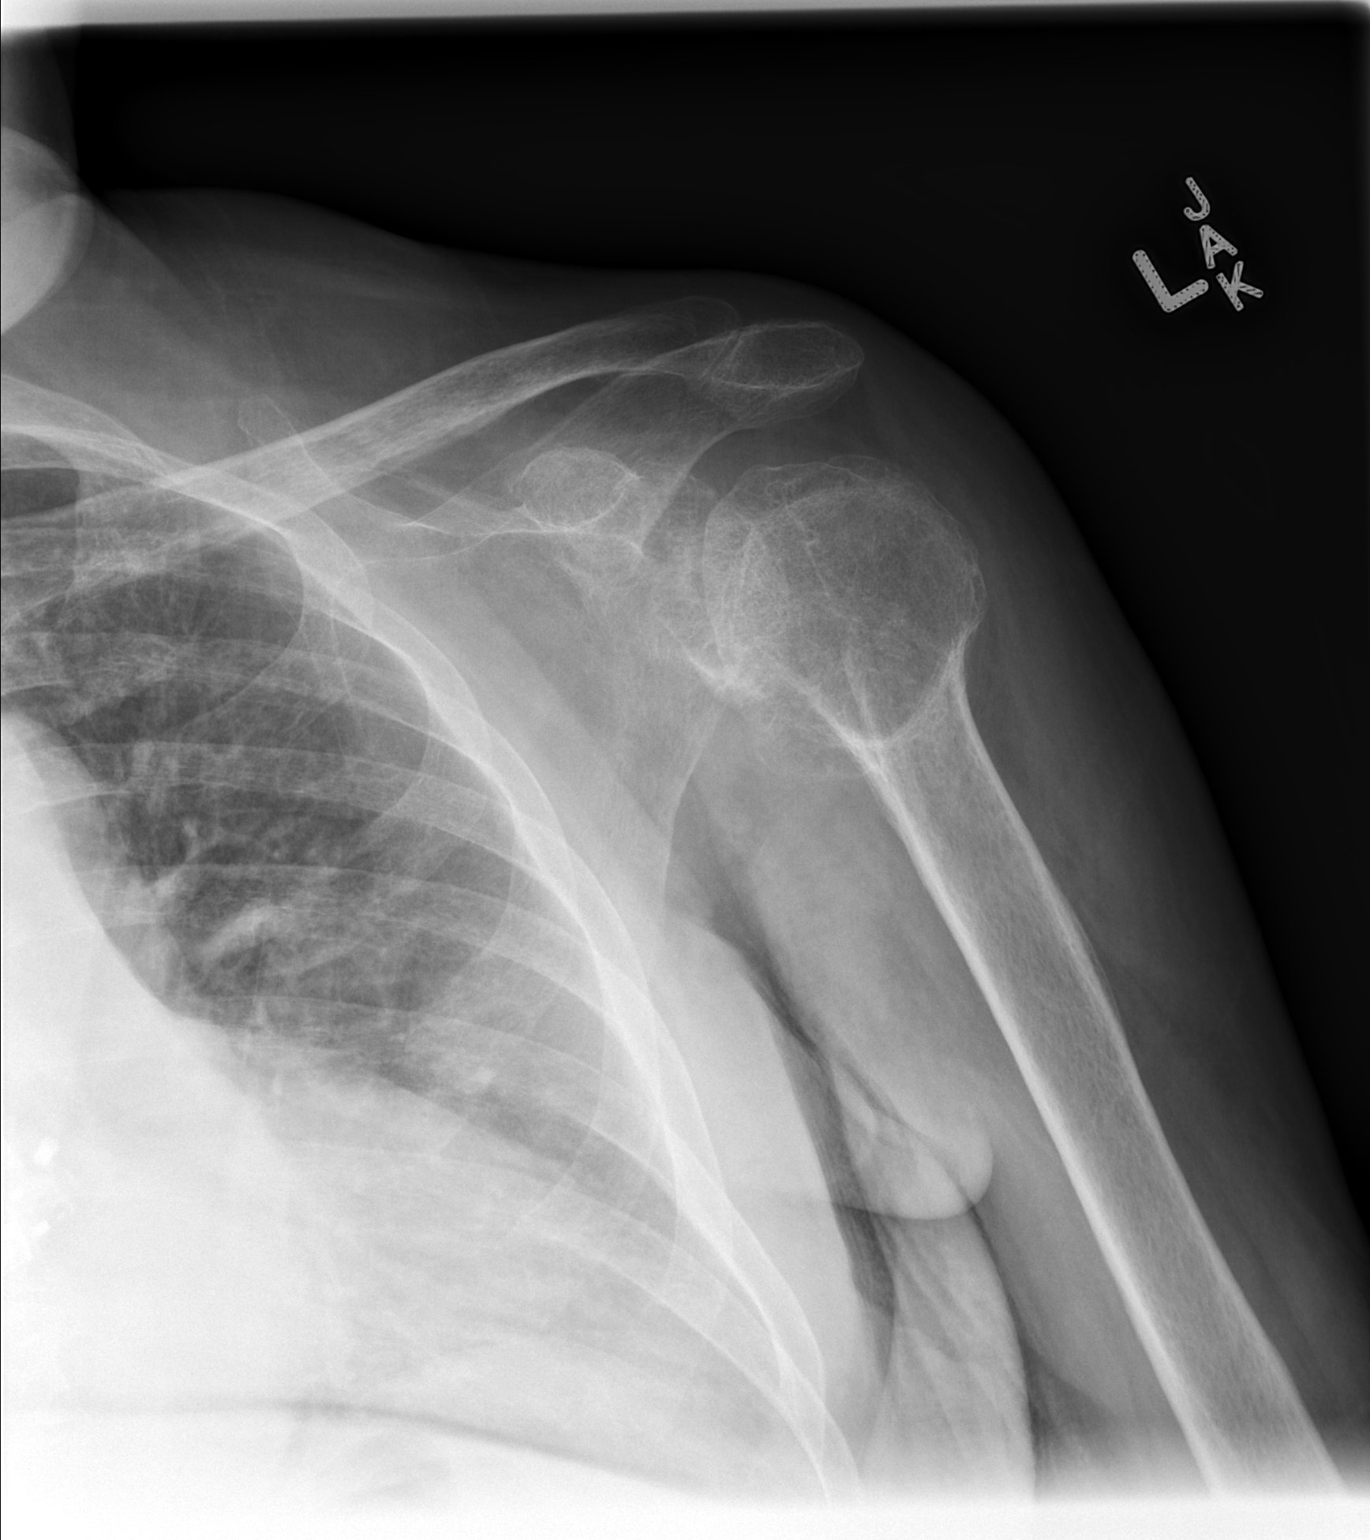

[3 of 3 positions shown; findings below may reference images not displayed]

FINDINGS: Severe joint space loss and bulky osteophytosis, but no glenohumeral
dislocation. The proximal left humerus and left scapula appear
intact. Visible left clavicle appears intact. Visible left ribs
appear intact.
IMPRESSION: Osteoarthritis with bulky osteophytosis but no acute fracture or
dislocation identified about the left shoulder.

## 2018-01-03 ENCOUNTER — Ambulatory Visit
Admission: RE | Admit: 2018-01-03 | Discharge: 2018-01-03 | Disposition: A | Discharge status: Home / Self Care | Payer: Medicare (Managed Care) | Source: Ambulatory Visit | Attending: Nurse Practitioner | Admitting: Nurse Practitioner

## 2018-01-03 ENCOUNTER — Other Ambulatory Visit: Payer: Self-pay | Admitting: Nurse Practitioner

## 2018-01-03 ENCOUNTER — Ambulatory Visit (INDEPENDENT_AMBULATORY_CARE_PROVIDER_SITE_OTHER): Payer: Medicare (Managed Care) | Admitting: Orthopaedic Surgery

## 2018-01-03 ENCOUNTER — Encounter (INDEPENDENT_AMBULATORY_CARE_PROVIDER_SITE_OTHER): Payer: Self-pay | Admitting: Orthopaedic Surgery

## 2018-01-03 DIAGNOSIS — R52 Pain, unspecified: Secondary | ICD-10-CM

## 2018-01-03 DIAGNOSIS — S42332A Displaced oblique fracture of shaft of humerus, left arm, initial encounter for closed fracture: Secondary | ICD-10-CM | POA: Diagnosis not present

## 2018-01-03 IMAGING — CR DG HAND COMPLETE 3+V*L*
3 series · 3 of 3 positions shown · non-contrast
Comparison: None.

CLINICAL DATA: Recent fall with pain and limited motion

EXAM:
LEFT HAND - COMPLETE 3+ VIEW

[x hand pa left]
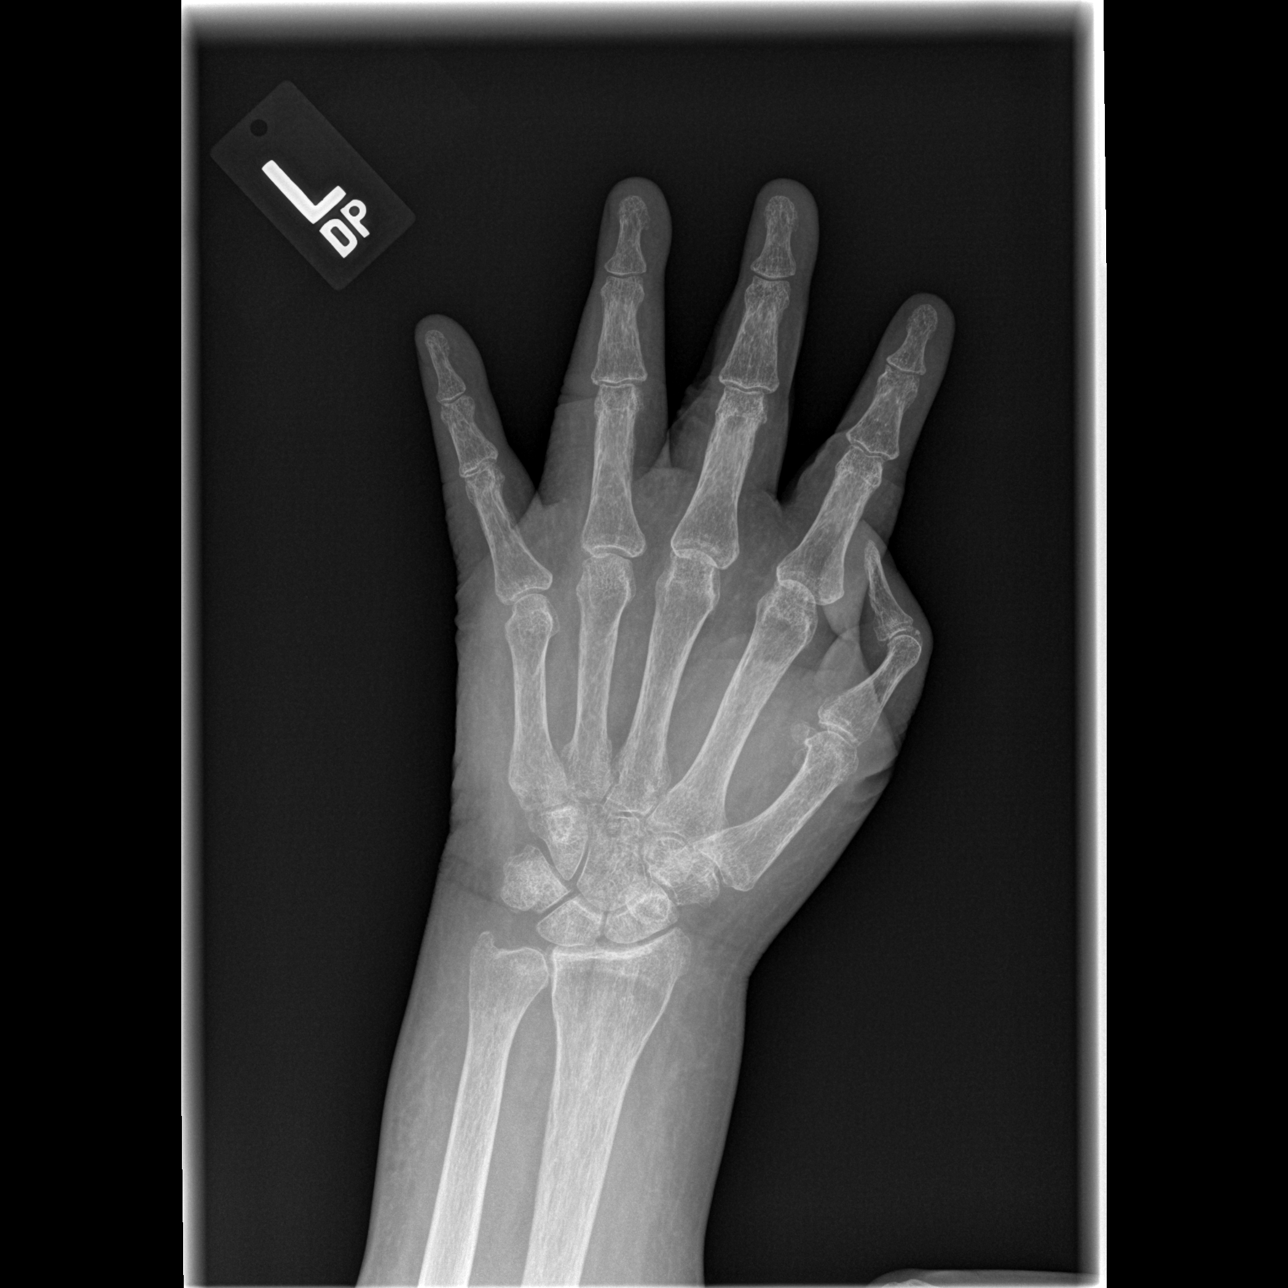

[x hand oblique left]
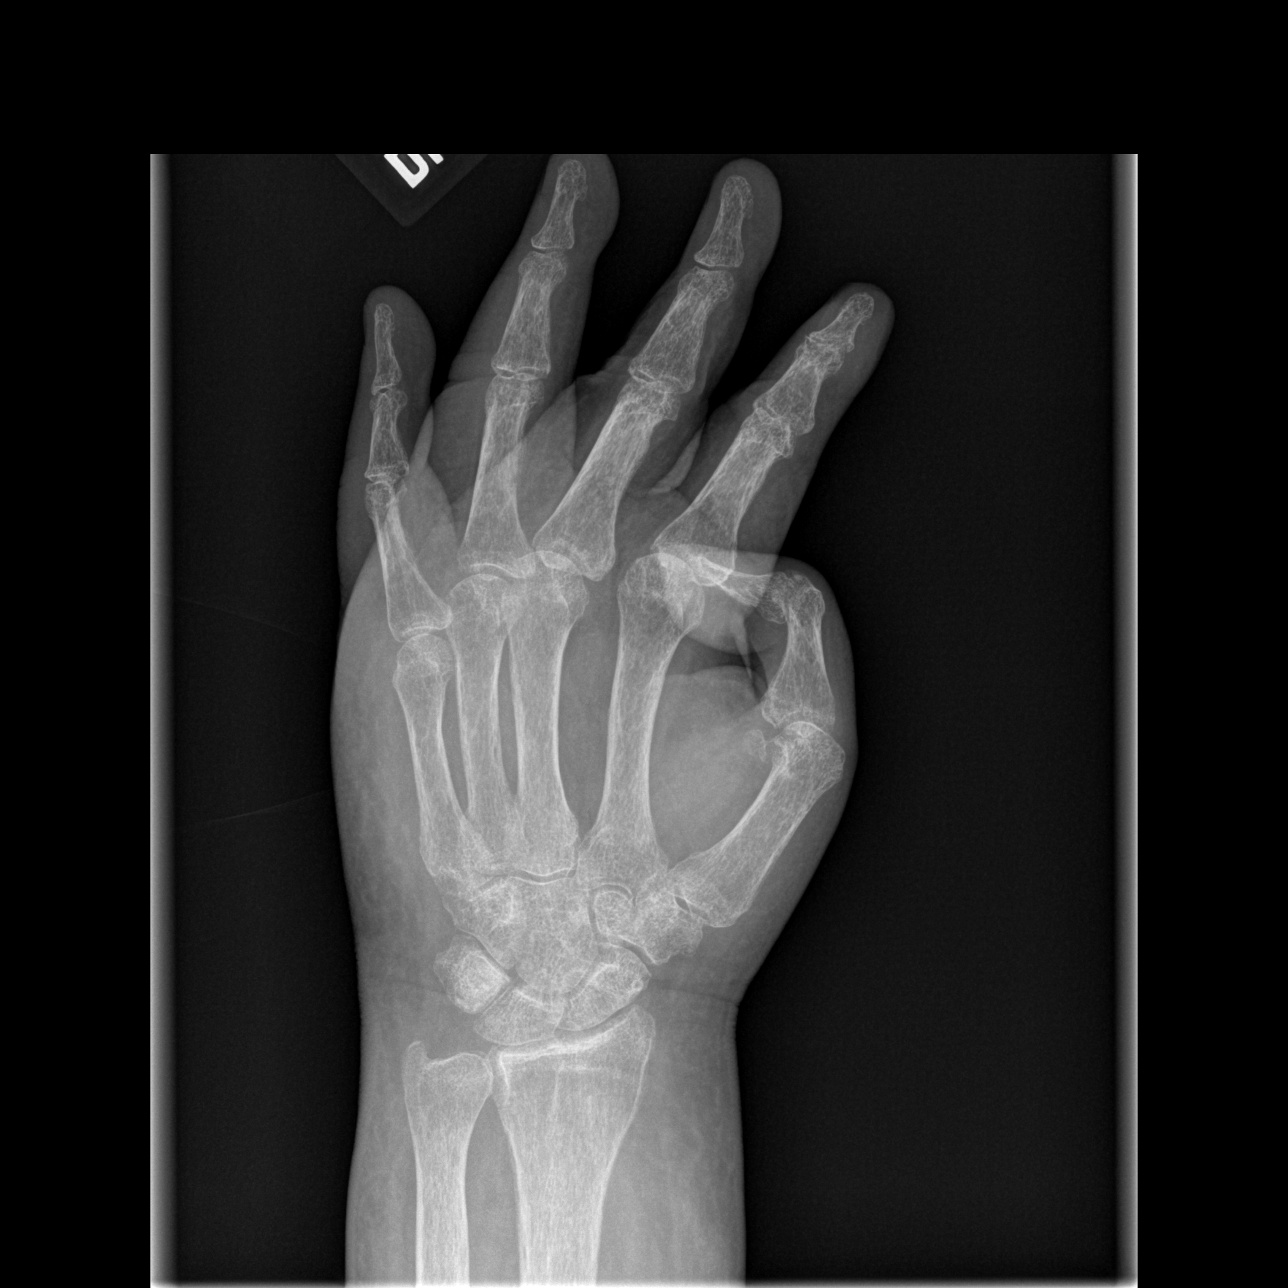

[x hand lat left]
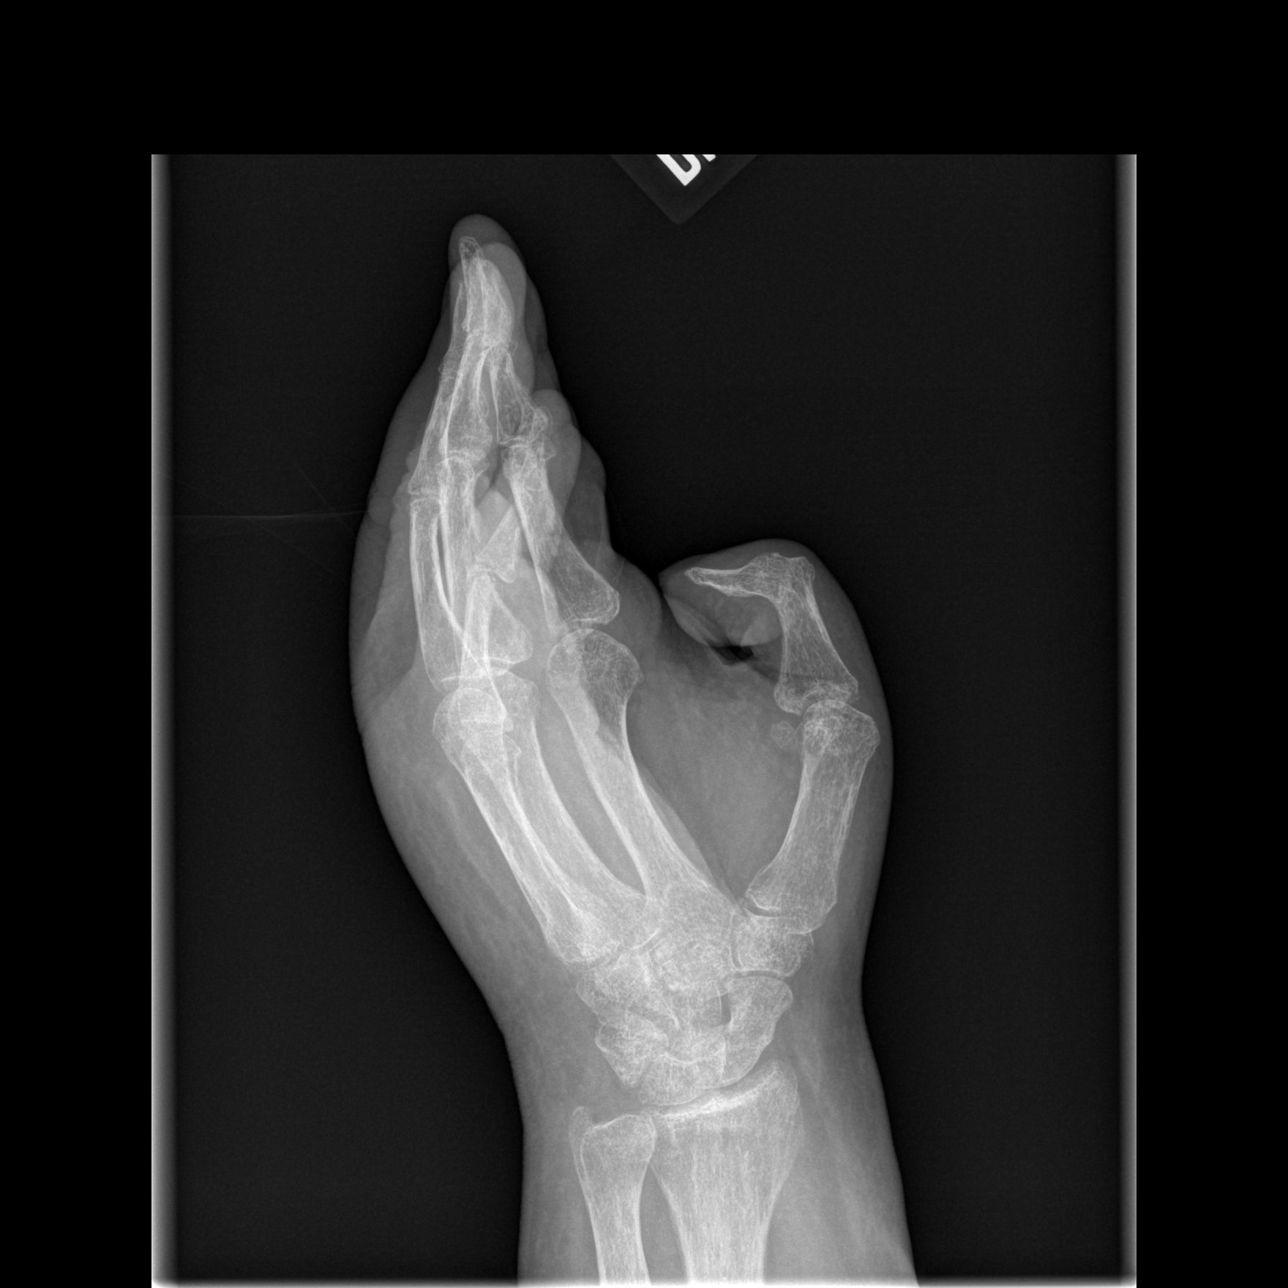

[3 of 3 positions shown; findings below may reference images not displayed]

FINDINGS: The bones are diffusely osteopenic. There are degenerative changes
involving the left radiocarpal joint space. The carpal bones are
normal position. MCP, PIP, and DIP joints are relatively well
preserved for age without significant degenerative change. No
fracture is noted.
IMPRESSION: No fracture.  Osteopenia.

## 2018-01-03 IMAGING — CR DG FOREARM 2V*L*
1 series · 1 of 1 positions shown · non-contrast
Comparison: None.

CLINICAL DATA: Left arm pain after a fall, limited motion

EXAM:
LEFT FOREARM - 2 VIEW

[x forearm lat left]
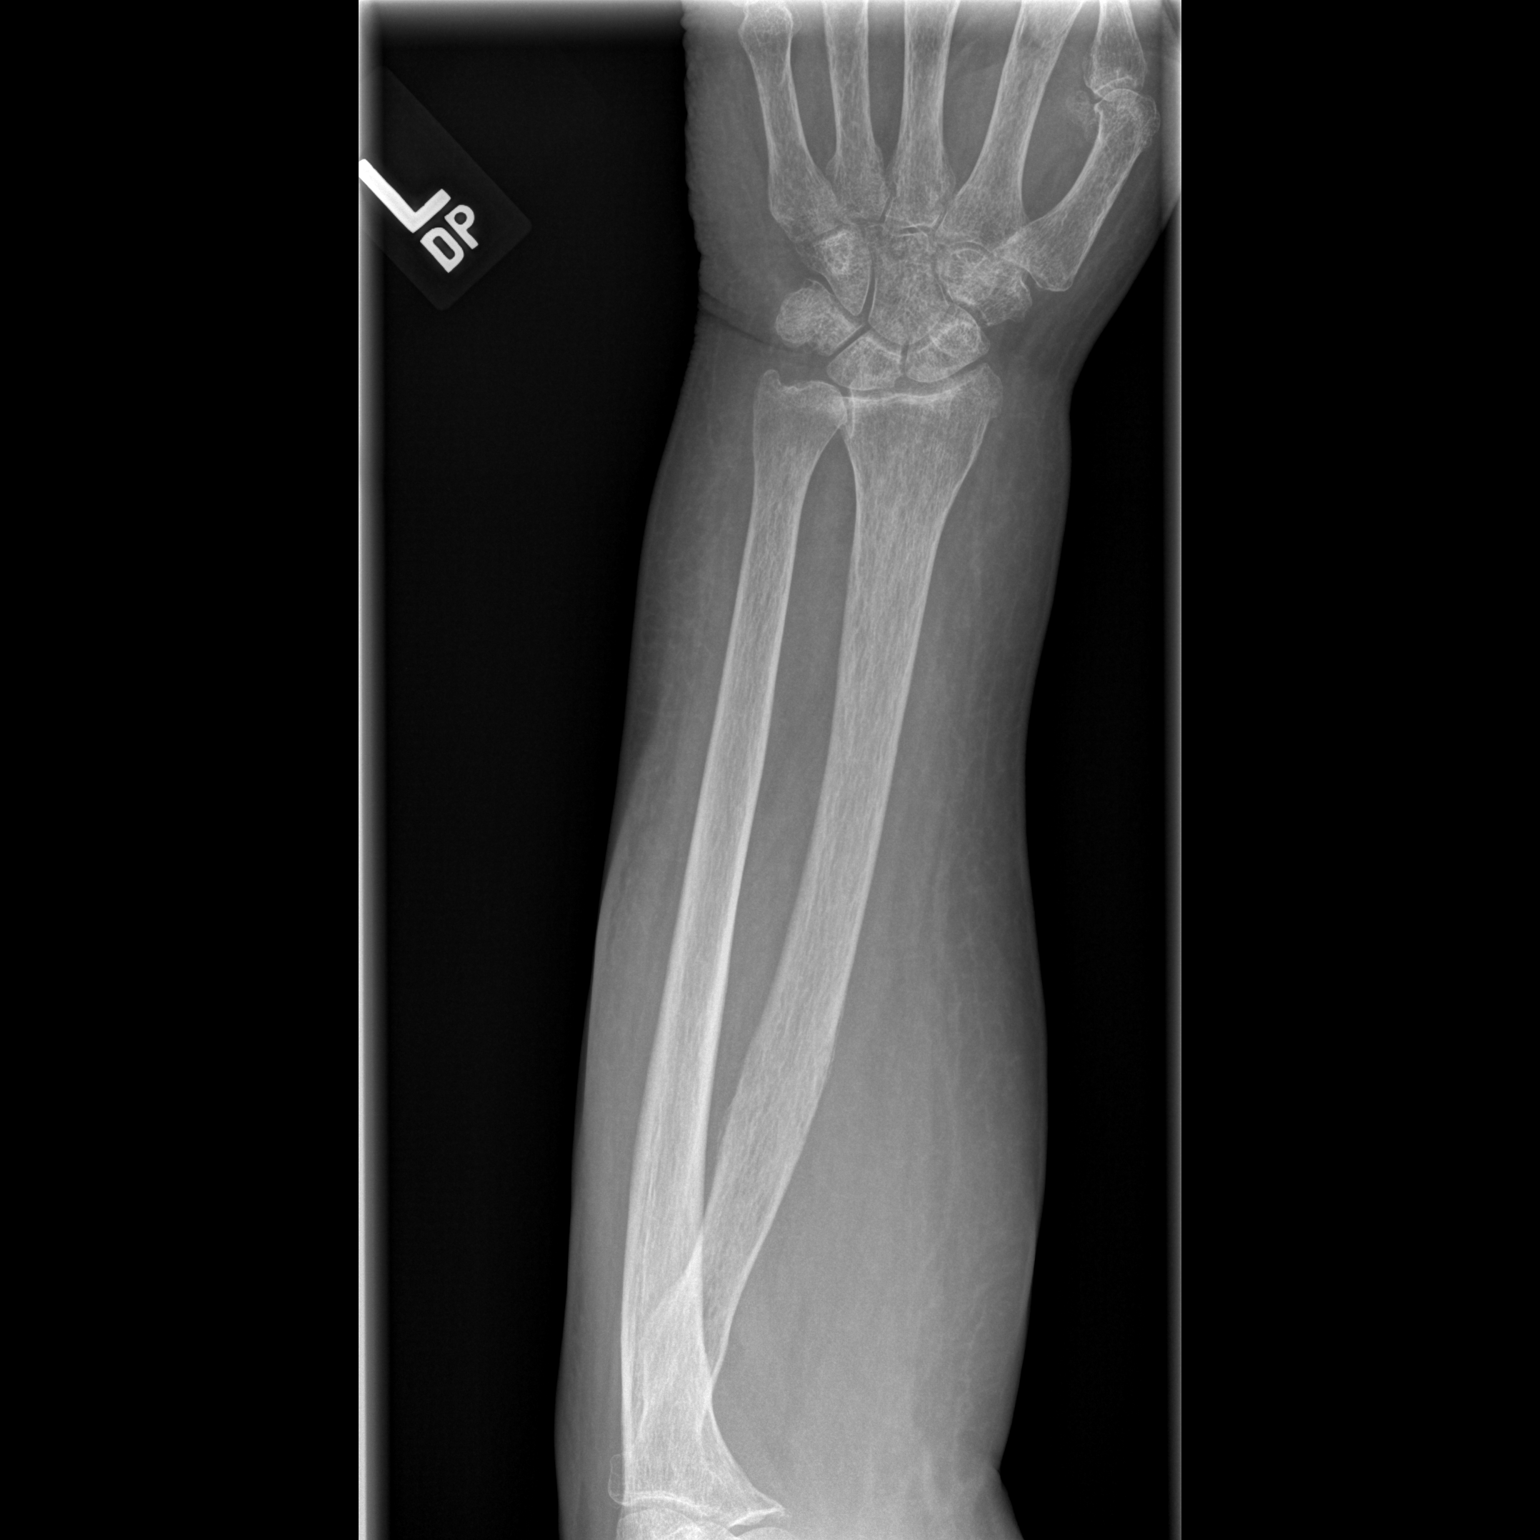

[1 of 1 positions shown; findings below may reference images not displayed]

FINDINGS: Only a single view of the forearm could be obtained. The bones are
somewhat osteopenic. There is degenerative change involving the left
radiocarpal joint space with some narrowing and sclerosis. However
no fracture is seen. The ulnar styloid appears intact. The carpal
bones are in normal position. Faint chondrocalcinosis may be
present.
IMPRESSION: Osteopenia. No fracture. Question faint chondrocalcinosis of the
wrist.

## 2018-01-03 IMAGING — DX DG SHOULDER 2+V*L*
2 series · 2 of 2 positions shown · non-contrast
Comparison: None.

CLINICAL DATA: Recent fall with left arm pain

EXAM:
LEFT SHOULDER - 2+ VIEW

[dg shoulder left (1 of 2)]
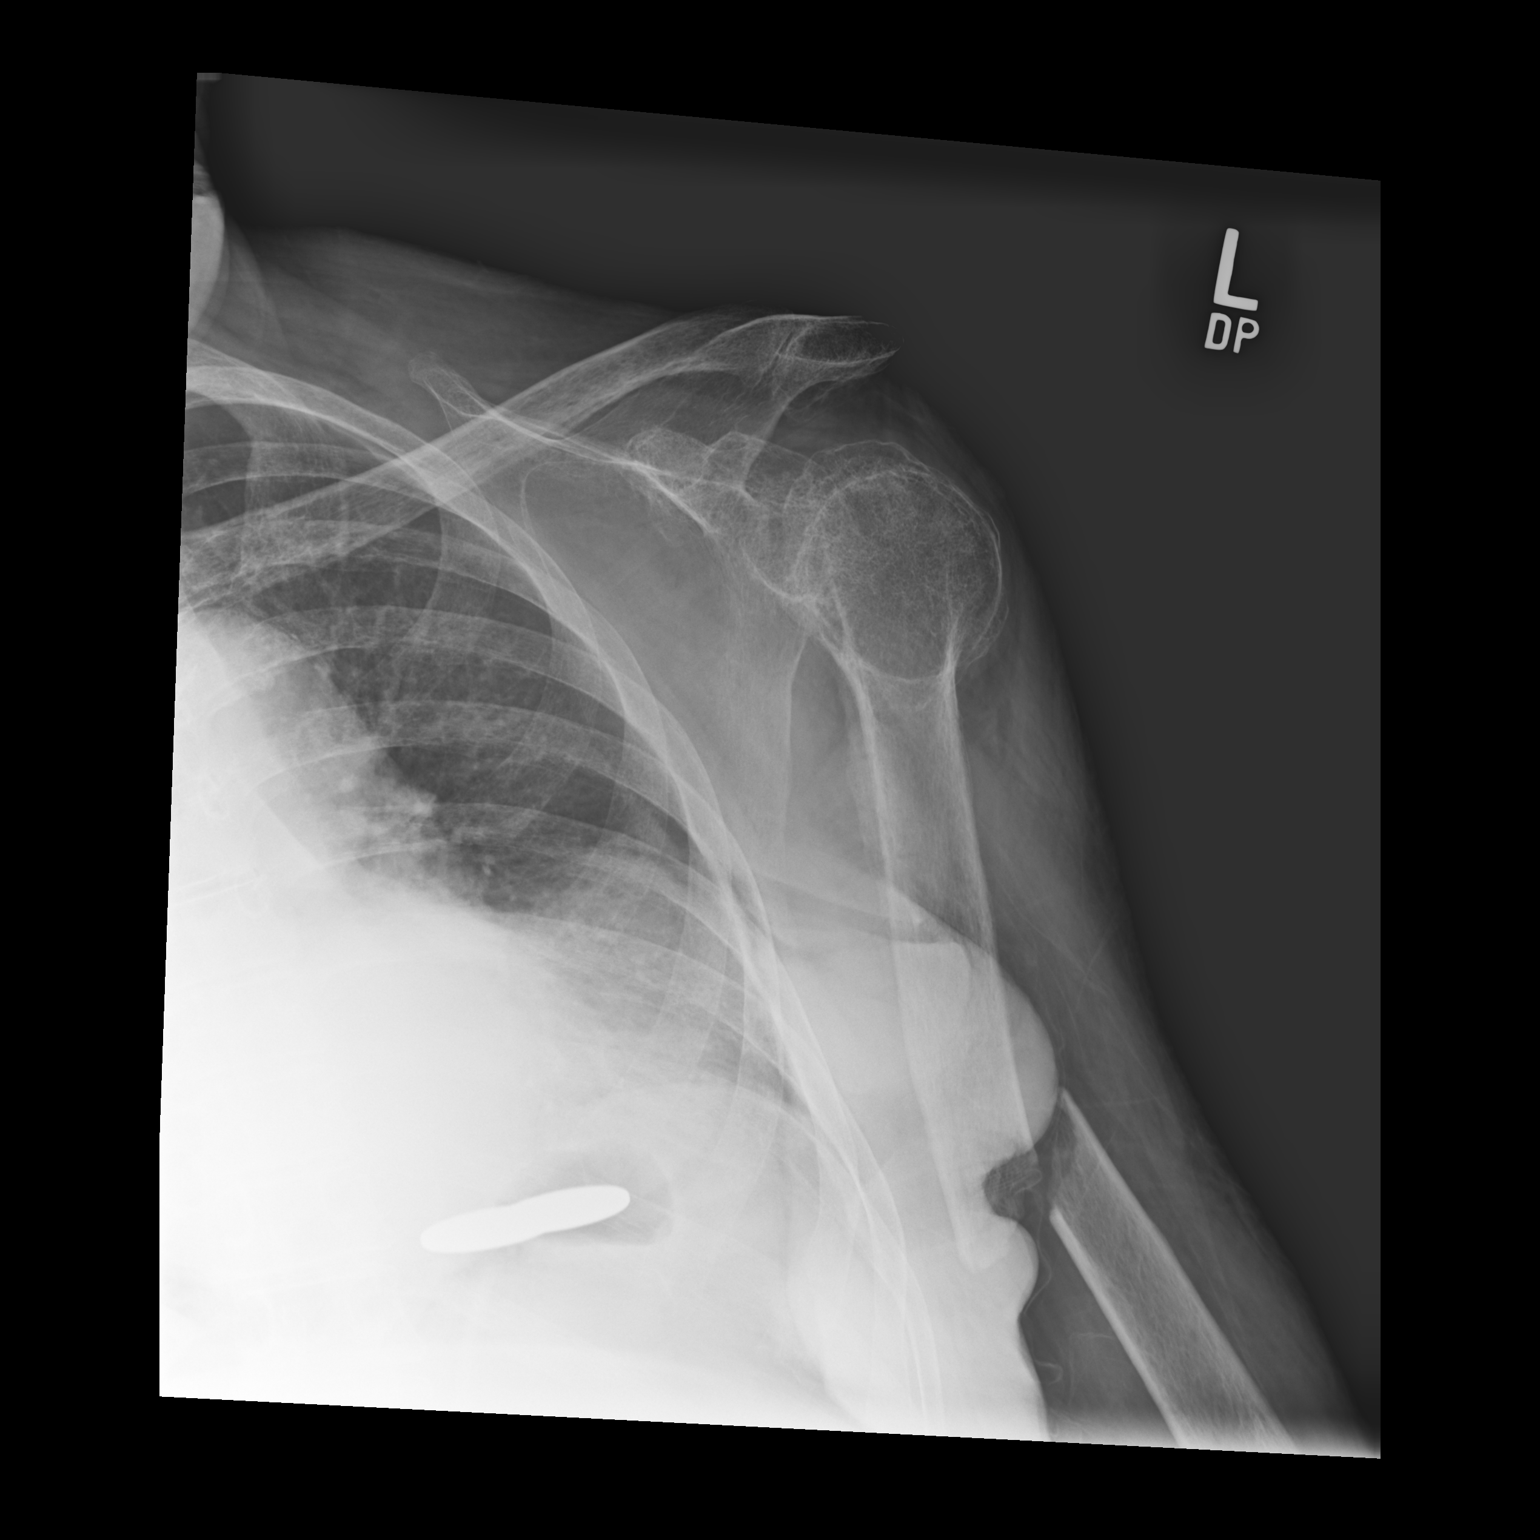

[dg shoulder left (2 of 2)]
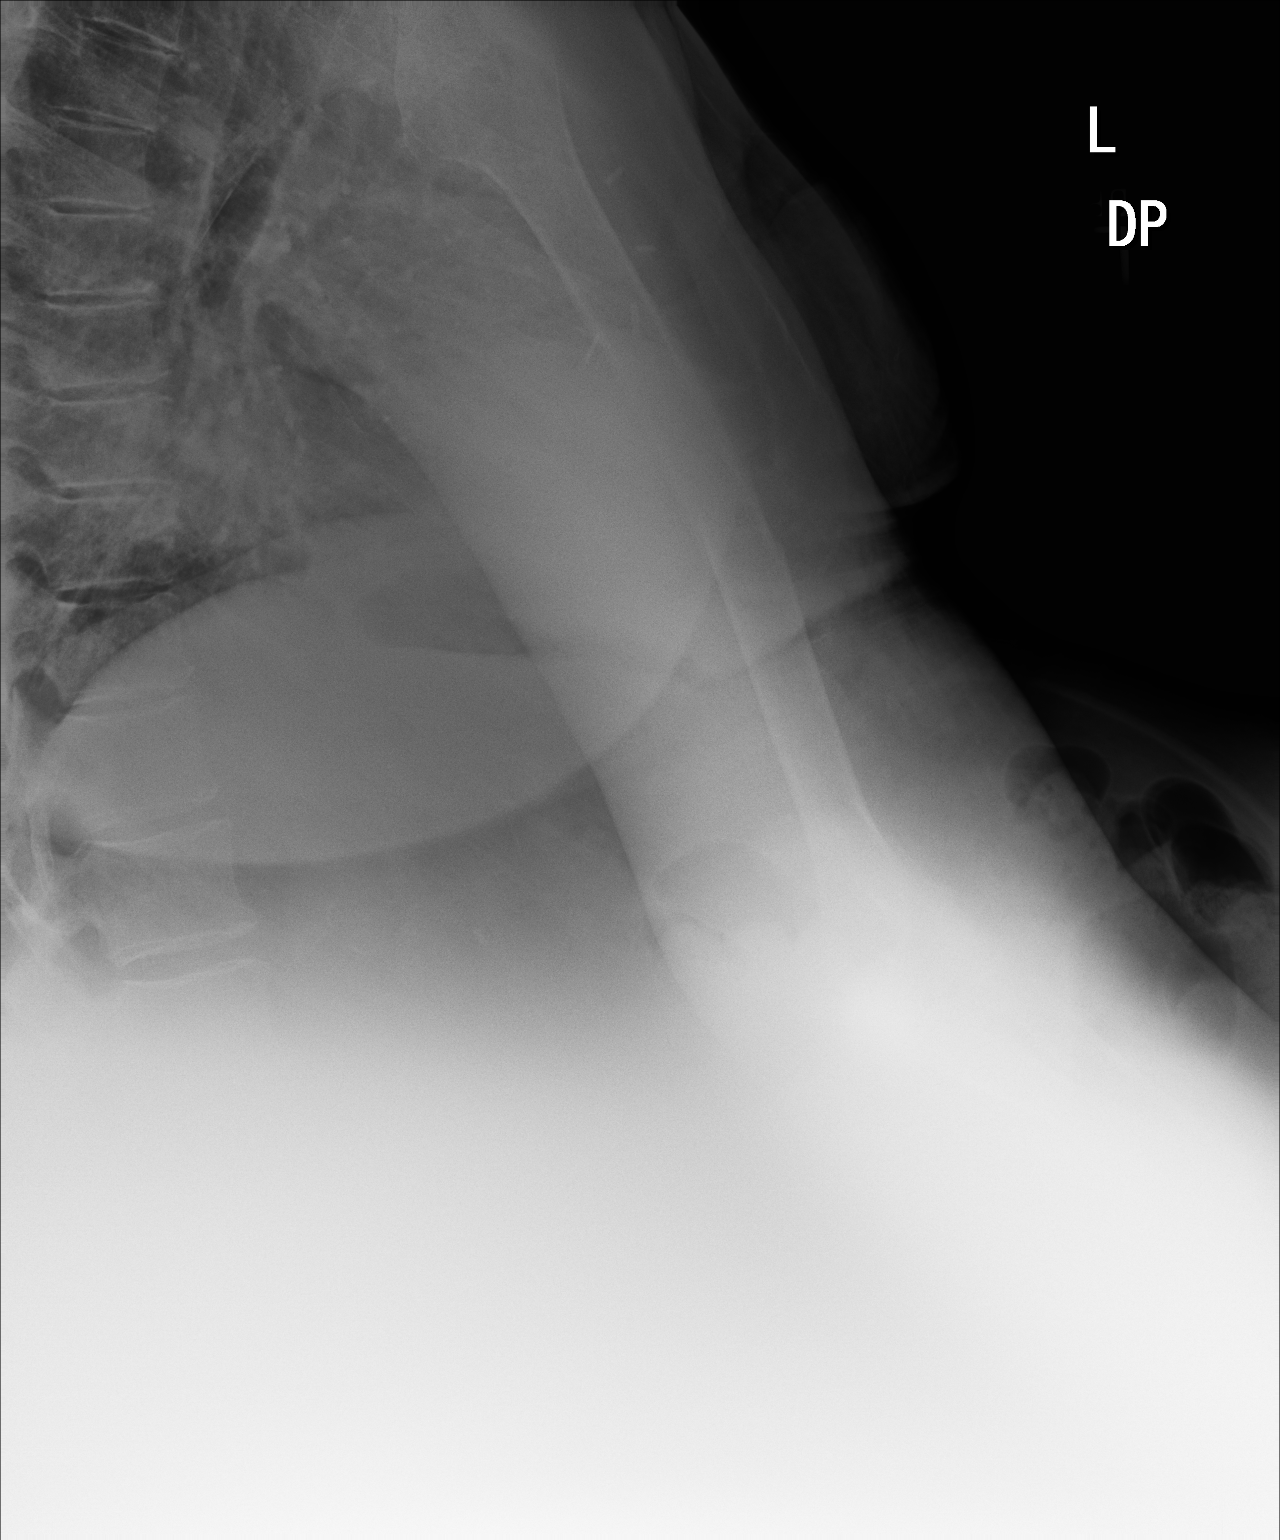

[2 of 2 positions shown; findings below may reference images not displayed]

FINDINGS: There is a angulated slightly displaced oblique fracture of the mid
left humeral shaft present. No other acute abnormality is seen.
Degenerative changes noted within the left glenohumeral joint.
IMPRESSION: Oblique slightly displaced angulated fracture of the mid left
humeral shaft.

## 2018-01-03 NOTE — Progress Notes (Signed)
Office Visit Note   Patient: Kirsten Mcmahon           Date of Birth: November 25, 1946           MRN: [id number] Visit Date: 01/03/2018              Requested by: Janifer Adie, MD [address] [place], [place] [address] PCP: Janifer Adie, MD   Assessment & Plan: Visit Diagnoses:  1. Closed displaced oblique fracture of shaft of left humerus, initial encounter     Plan: Impression is 72 year old female with displaced left humeral shaft fracture.  We will plan on treating this in a closed fashion with a Sarmiento brace.  We gave her a referral to [employer] for brace.  Follow-up in 1 week for repeat 2 view x-rays of the left humeral shaft in Sarmiento brace.  Follow-Up Instructions: Return in about 1 week (around 01/10/2018).   Orders:  No orders of the defined types were placed in this encounter.  No orders of the defined types were placed in this encounter.     Procedures: No procedures performed   Clinical Data: No additional findings.   Subjective: Chief Complaint  Patient presents with  . Left Shoulder - Pain    Patient is a 72 year old female who comes in with acute left humeral shaft fracture from mechanical fall yesterday.  Patient has baseline left-sided hemiparesis from a stroke in 1988.  She ambulates in a wheelchair mainly.  She does have pain and swelling of her left arm.    Review of Systems  Constitutional: Negative.   HENT: Negative.   Eyes: Negative.   Respiratory: Negative.   Cardiovascular: Negative.   Endocrine: Negative.   Musculoskeletal: Negative.   Neurological: Negative.   Hematological: Negative.   Psychiatric/Behavioral: Negative.   All other systems reviewed and are negative.    Objective: Vital Signs: There were no vitals taken for this visit.  Physical Exam  Constitutional: She is oriented to person, place, and time. She appears well-developed and well-nourished.  HENT:  Head: Normocephalic and atraumatic.  Eyes: EOM are  normal.  Neck: Neck supple.  Pulmonary/Chest: Effort normal.  Abdominal: Soft.  Neurological: She is alert and oriented to person, place, and time.  Skin: Skin is warm. Capillary refill takes less than 2 seconds.  Psychiatric: She has a normal mood and affect. Her behavior is normal. Judgment and thought content normal.  Nursing note and vitals reviewed.   Ortho Exam Left upper extremity shows mild swelling.  She has profound weakness of motor function due to baseline hemiparesis.  Hand is warm and well-perfused.  Strong radial pulse. Specialty Comments:  No specialty comments available.  Imaging: Dg Forearm Left  Result Date: 01/03/2018 CLINICAL DATA:  Left arm pain after a fall, limited motion EXAM: LEFT FOREARM - 2 VIEW COMPARISON:  None. FINDINGS: Only a single view of the forearm could be obtained. The bones are somewhat osteopenic. There is degenerative change involving the left radiocarpal joint space with some narrowing and sclerosis. However no fracture is seen. The ulnar styloid appears intact. The carpal bones are in normal position. Faint chondrocalcinosis may be present. IMPRESSION: Osteopenia. No fracture. Question faint chondrocalcinosis of the wrist. Electronically Signed   By: Ivar Drape M.D.   On: 01/03/2018 13:36   Dg Shoulder Left  Result Date: 01/03/2018 CLINICAL DATA:  Recent fall with left arm pain EXAM: LEFT SHOULDER - 2+ VIEW COMPARISON:  None. FINDINGS: There is a angulated  slightly displaced oblique fracture of the mid left humeral shaft present. No other acute abnormality is seen. Degenerative changes noted within the left glenohumeral joint. IMPRESSION: Oblique slightly displaced angulated fracture of the mid left humeral shaft. Electronically Signed   By: Ivar Drape M.D.   On: 01/03/2018 13:38   Dg Hand Complete Left  Result Date: 01/03/2018 CLINICAL DATA:  Recent fall with pain and limited motion EXAM: LEFT HAND - COMPLETE 3+ VIEW COMPARISON:  None.  FINDINGS: The bones are diffusely osteopenic. There are degenerative changes involving the left radiocarpal joint space. The carpal bones are normal position. MCP, PIP, and DIP joints are relatively well preserved for age without significant degenerative change. No fracture is noted. IMPRESSION: No fracture.  Osteopenia. Electronically Signed   By: Ivar Drape M.D.   On: 01/03/2018 13:37     PMFS History: Patient Active Problem List   Diagnosis Date Noted  . Multiple thyroid nodules 07/13/2017  . Tracheal deviation 07/13/2017  . Malignant neoplasm of upper-outer quadrant of right breast in female, estrogen receptor positive ([place]) 01/20/2017  . Primary vulvar squamous cell carcinoma ([place]) 01/20/2017   Past Medical History:  Diagnosis Date  . Brain aneurysm   . Breast cancer ([place])   . Cancer [facility])    pt. reports that she has cancerous cyst from her back removed "in [place]"   . Depression   . Diabetes mellitus without complication ([place])   . GERD (gastroesophageal reflux disease)   . Glaucoma   . High cholesterol   . Hypertension   . Pneumonia   . Seizures ([place]) 2011   as a result of spider bite   . Sleep apnea    h/o sleep apnea, used a machine for a time but "they thought I was better & removed the CPAP"   . Stroke [facility]) 1992   weakness on the L UE  . Thyroid mass   . Vertigo     History reviewed. No pertinent family history.  Past Surgical History:  Procedure Laterality Date  . ABDOMINAL SURGERY     pt. reports that she had surgery for uterine cancer  . BRAIN SURGERY  1992   due to aneurysm  . BREAST LUMPECTOMY     right breast  . EYE SURGERY Left    cataract removed   . PICC LINE INSERTION      for antibiotic - treatment following "spider bite"  . THYROID LOBECTOMY Right 07/15/2017   Procedure: RIGHT THYROID LOBECTOMY;  Surgeon: Armandina Gemma, MD;  Location: [place];  Service: General;  Laterality: Right;   Social History   Occupational History  . Not on file    Tobacco Use  . Smoking status: Never Smoker  . Smokeless tobacco: Never Used  Substance and Sexual Activity  . Alcohol use: No  . Drug use: No  . Sexual activity: No    Birth control/protection: Post-menopausal

## 2018-01-20 ENCOUNTER — Inpatient Hospital Stay: Payer: Medicare (Managed Care) | Attending: Hematology and Oncology | Admitting: Hematology and Oncology

## 2018-01-20 ENCOUNTER — Other Ambulatory Visit: Payer: Self-pay | Admitting: Internal Medicine

## 2018-01-20 ENCOUNTER — Telehealth: Payer: Self-pay | Admitting: Hematology and Oncology

## 2018-01-20 DIAGNOSIS — Z7984 Long term (current) use of oral hypoglycemic drugs: Secondary | ICD-10-CM | POA: Diagnosis not present

## 2018-01-20 DIAGNOSIS — Z7982 Long term (current) use of aspirin: Secondary | ICD-10-CM | POA: Insufficient documentation

## 2018-01-20 DIAGNOSIS — E119 Type 2 diabetes mellitus without complications: Secondary | ICD-10-CM | POA: Insufficient documentation

## 2018-01-20 DIAGNOSIS — C50411 Malignant neoplasm of upper-outer quadrant of right female breast: Secondary | ICD-10-CM | POA: Diagnosis not present

## 2018-01-20 DIAGNOSIS — Z170 Estrogen receptor positive status [ER+]: Secondary | ICD-10-CM | POA: Insufficient documentation

## 2018-01-20 DIAGNOSIS — Z79899 Other long term (current) drug therapy: Secondary | ICD-10-CM | POA: Insufficient documentation

## 2018-01-20 DIAGNOSIS — Z79811 Long term (current) use of aromatase inhibitors: Secondary | ICD-10-CM | POA: Diagnosis not present

## 2018-01-20 DIAGNOSIS — Z8542 Personal history of malignant neoplasm of other parts of uterus: Secondary | ICD-10-CM | POA: Insufficient documentation

## 2018-01-20 DIAGNOSIS — M8580 Other specified disorders of bone density and structure, unspecified site: Secondary | ICD-10-CM | POA: Insufficient documentation

## 2018-01-20 NOTE — Progress Notes (Signed)
Patient Care Team: Janifer Adie, MD as PCP - General (Family Medicine)  DIAGNOSIS:  Encounter Diagnosis  Name Primary?  . Malignant neoplasm of upper-outer quadrant of right breast in female, estrogen receptor positive ([place])     SUMMARY OF ONCOLOGIC HISTORY:   Malignant neoplasm of upper-outer quadrant of right breast in female, estrogen receptor positive ([place])   03/14/2015 Initial Diagnosis    On workup for blood work cancer PET/CT showed right breast lesion, 11:00 position 2.6 cm diameter biopsy done 05/20/2015 IDC grade 2 ER 95% PR 80% HER-2 negative      07/14/2015 Surgery    Right lumpectomy: IDC grade 2, 2.5 cm, 1/2 sentinel nodes positive, DCIS, LCIS, lateral inferior and deep margins positive for LCIS, T2 N1 M0 stage II a, radiation did not recommend adjuvant radiation ( at [facility])      08/21/2015 -  Anti-estrogen oral therapy    Letrozole 2.5 mg daily       Primary vulvar squamous cell carcinoma ([place])   03/25/2015 Surgery    Vulvectomy by Dr. [name] at [facility]; invasive squamous cell carcinoma margins are negative, did not require any further treatment       CHIEF COMPLIANT: Recent left humerus fracture, letrozole therapy  INTERVAL HISTORY: Kirsten Mcmahon is a 72 year old with above-mentioned history of right breast cancer treated with lumpectomy and is currently on letrozole therapy.  While she was at home she slipped and fell and broke her left humerus.  She is getting x-rays and workup with orthopedics.  She has had profound swelling of the left arm but she tells me that the swelling is getting better.  She has an aide who is been helping her with activities of daily living and her daughter helps her at nighttime.  She is in a wheelchair.  REVIEW OF SYSTEMS:   Constitutional: Denies fevers, chills or abnormal weight loss Eyes: Denies blurriness of vision Ears, nose, mouth, throat, and face: Denies mucositis or sore throat Respiratory: Denies  cough, dyspnea or wheezes Cardiovascular: Denies palpitation, chest discomfort Gastrointestinal:  Denies nausea, heartburn or change in bowel habits Skin: Denies abnormal skin rashes Lymphatics: Denies new lymphadenopathy or easy bruising Neurological:Denies numbness, tingling or new weaknesses Behavioral/Psych: Mood is stable, no new changes  Extremities: Left humerus fracture All other systems were reviewed with the patient and are negative.  I have reviewed the past medical history, past surgical history, social history and family history with the patient and they are unchanged from previous note.  ALLERGIES:  is allergic to levemir [insulin detemir].  MEDICATIONS:  Current Outpatient Medications  Medication Sig Dispense Refill  . acetaminophen (TYLENOL) 325 MG tablet Take 650 mg by mouth every 4 (four) hours as needed (for pain.).    Marland Kitchen alum & mag hydroxide-simeth (MAALOX PLUS) 400-400-40 MG/5ML suspension Take 5 mLs by mouth every 6 (six) hours as needed for indigestion.    Marland Kitchen amLODipine (NORVASC) 5 MG tablet Take 5 mg by mouth daily.    Marland Kitchen aspirin EC 81 MG tablet Take 81 mg by mouth daily.    . bimatoprost (LUMIGAN) 0.01 % SOLN Place 1 drop into the left eye at bedtime.    . Cholecalciferol (VITAMIN D3) 2000 units TABS Take 2,000 Units by mouth daily.    . citalopram (CELEXA) 20 MG tablet Take 20 mg by mouth daily.    . hydrochlorothiazide (HYDRODIURIL) 25 MG tablet Take 25 mg by mouth daily.    Marland Kitchen HYDROcodone-acetaminophen (NORCO/VICODIN) 5-325 MG  tablet Take 1 tablet by mouth every 4 (four) hours as needed for moderate pain. 15 tablet 0  . hydroxypropyl methylcellulose / hypromellose (ISOPTO TEARS / GONIOVISC) 2.5 % ophthalmic solution Place 1 drop into both eyes 3 (three) times daily.    Marland Kitchen letrozole (FEMARA) 2.5 MG tablet Take 2.5 mg by mouth daily.    Marland Kitchen losartan (COZAAR) 100 MG tablet Take 100 mg by mouth daily.    . meclizine (ANTIVERT) 12.5 MG tablet Take 12.5 mg by mouth 2 (two)  times daily as needed for dizziness.    . metFORMIN (GLUCOPHAGE-XR) 500 MG 24 hr tablet Take 500 mg by mouth daily.    . mirabegron ER (MYRBETRIQ) 25 MG TB24 tablet Take 25 mg by mouth daily.    . polyethylene glycol powder (GLYCOLAX/MIRALAX) powder Take 17 g by mouth daily as needed (for constipation).     . prednisoLONE acetate (PRED FORTE) 1 % ophthalmic suspension Place 1 drop into the left eye 3 (three) times daily.    . ranitidine (ZANTAC) 75 MG tablet Take 75 mg by mouth 2 (two) times daily.    Marland Kitchen senna-docusate (SENNA S) 8.6-50 MG tablet Take 2 tablets by mouth at bedtime.     . simethicone (MYLICON) 485 MG chewable tablet Chew 125 mg by mouth every 6 (six) hours as needed (for gas discomfort).    . simvastatin (ZOCOR) 10 MG tablet Take 10 mg by mouth at bedtime.      No current facility-administered medications for this visit.     PHYSICAL EXAMINATION: ECOG PERFORMANCE STATUS: 1 - Symptomatic but completely ambulatory  Vitals:   01/20/18 0910  BP: 138/88  Pulse: 78  Resp: 17  Temp: 98.3 F (36.8 C)  SpO2: 100%   Filed Weights    GENERAL:alert, no distress and comfortable SKIN: skin color, texture, turgor are normal, no rashes or significant lesions EYES: normal, Conjunctiva are pink and non-injected, sclera clear OROPHARYNX:no exudate, no erythema and lips, buccal mucosa, and tongue normal  NECK: supple, thyroid normal size, non-tender, without nodularity LYMPH:  no palpable lymphadenopathy in the cervical, axillary or inguinal LUNGS: clear to auscultation and percussion with normal breathing effort HEART: regular rate & rhythm and no murmurs and no lower extremity edema ABDOMEN:abdomen soft, non-tender and normal bowel sounds MUSCULOSKELETAL:no cyanosis of digits and no clubbing  NEURO: alert & oriented x 3 with fluent speech, no focal motor/sensory deficits EXTREMITIES: Left arm swelling from the fracture  LABORATORY DATA:  I have reviewed the data as listed CMP  Latest Ref Rng & Units 07/16/2017 07/07/2017 12/24/2016  Glucose 65 - 99 mg/dL 246(H) 139(H) 215(H)  BUN 6 - 20 mg/dL _0 Creatinine 0.44 - 1.00 mg/dL 1.44(H) 1.45(H) 1.14(H)  Sodium 135 - 145 mmol/L 133(L) 137 136  Potassium 3.5 - 5.1 mmol/L 4.5 3.8 3.6  Chloride 101 - 111 mmol/L 101 103 103  CO2 22 - 32 mmol/L _1 Calcium 8.9 - 10.3 mg/dL 8.7(L) 9.3 8.6(L)  Total Protein 6.5 - 8.1 g/dL - - 7.5  Total Bilirubin 0.3 - 1.2 mg/dL - - 0.4  Alkaline Phos 38 - 126 U/L - - 103  AST 15 - 41 U/L - - 28  ALT 14 - 54 U/L - - 21    Lab Results  Component Value Date   WBC 6.5 07/07/2017   HGB 12.2 07/07/2017   HCT 37.8 07/07/2017   MCV 90.0 07/07/2017   PLT 336 07/07/2017   NEUTROABS 10.1 (  H) 12/24/2016    ASSESSMENT & PLAN:  Malignant neoplasm of upper-outer quadrant of right breast in female, estrogen receptor positive ([place]) Right lumpectomy 07/14/2015: IDC grade 2, 2.5 cm, 1/2 sentinel nodes positive, DCIS, LCIS, lateral inferior and deep margins positive for LCIS, T2 N1 M0 stage II a, radiation did not recommend adjuvant radiation ( at [facility]) Current treatment: Letrozole 2.5 mg daily Previously  under the care of Dr. Leonard Downing at [facility] Patient like to transition her care to [place].   Breast cancer surveillance:  Mammogram 12/09/2016: Benign appearing right breast mass at 11:00 4 cm from the nipple measuring 9 mm, breast density category C.  Patient will need a new mammogram for 2019  Recent fall with left arm pain: Fracture of the mid left humeral shaft, osteopenia  Letrozole toxicities: No major side effects or tolerating it Very well. She will need a bone density test once her fracture is healed  Uterine cancer: In remission  Vulvar cancer: Status post resection.In remission  Left-sided stroke with left arm swelling: Chronic in nature  Left arm humerus fracture: Seeing orthopedics  Return to clinic in 1 year for follow-up   I spent 25  minutes talking to the patient of which more than half was spent in counseling and coordination of care.  No orders of the defined types were placed in this encounter.  The patient has a good understanding of the overall plan. she agrees with it. she will call with any problems that may develop before the next visit here.   Harriette Ohara, MD 01/20/18

## 2018-01-20 NOTE — Assessment & Plan Note (Signed)
Right lumpectomy 07/14/2015: IDC grade 2, 2.5 cm, 1/2 sentinel nodes positive, DCIS, LCIS, lateral inferior and deep margins positive for LCIS, T2 N1 M0 stage II a, radiation did not recommend adjuvant radiation ( at [facility]) Current treatment: Letrozole 2.5 mg daily Previously  under the care of Dr. Leonard Downing at [facility] Patient like to transition her care to [place].   Breast cancer surveillance:  Mammogram 12/09/2016: Benign appearing right breast mass at 11:00 4 cm from the nipple measuring 9 mm, breast density category C.  Patient will need a new mammogram for 2019  Recent fall with left arm pain: Fracture of the mid left humeral shaft, osteopenia  Letrozole toxicities: No major side effects or tolerating it Very well. She will need a bone density test once her fracture is healed  Uterine cancer: In remission  Vulvar cancer: Status post resection.In remission  Left-sided stroke with left arm swelling: Chronic in nature   Return to clinic in 1 year for follow-up

## 2018-01-20 NOTE — Telephone Encounter (Signed)
Gave patient AVS and calendar of upcoming February 2020 appointments.

## 2018-01-23 ENCOUNTER — Other Ambulatory Visit: Payer: Self-pay | Admitting: Internal Medicine

## 2018-01-23 DIAGNOSIS — N6310 Unspecified lump in the right breast, unspecified quadrant: Secondary | ICD-10-CM

## 2018-01-27 ENCOUNTER — Other Ambulatory Visit: Payer: Medicare (Managed Care)

## 2018-02-21 ENCOUNTER — Ambulatory Visit (INDEPENDENT_AMBULATORY_CARE_PROVIDER_SITE_OTHER): Payer: Medicare (Managed Care) | Admitting: Orthopaedic Surgery

## 2018-02-21 ENCOUNTER — Encounter (INDEPENDENT_AMBULATORY_CARE_PROVIDER_SITE_OTHER): Payer: Self-pay | Admitting: Orthopaedic Surgery

## 2018-02-21 ENCOUNTER — Ambulatory Visit (INDEPENDENT_AMBULATORY_CARE_PROVIDER_SITE_OTHER): Payer: Medicare (Managed Care)

## 2018-02-21 DIAGNOSIS — S42332A Displaced oblique fracture of shaft of humerus, left arm, initial encounter for closed fracture: Secondary | ICD-10-CM

## 2018-02-21 NOTE — Progress Notes (Signed)
Patient: Kirsten Mcmahon           Date of Birth: 12-13-1945           MRN: [id number] Visit Date: 02/21/2018 PCP: Kirsten Adie, MD   Assessment & Plan:  Chief Complaint:  Chief Complaint  Patient presents with  . Left Shoulder - Pain   Visit Diagnoses:  1. Closed displaced oblique fracture of shaft of left humerus, initial encounter     Plan: [name] comes in for follow-up.  6 weeks status post displaced oblique fracture left humeral shaft, date of injury 01/02/2018.  She has been in a Sarmiento brace.  Doing well.  Minimal pain.  She exhibits minimal tenderness over humeral shaft.  She has moderate swelling to the left hand and is unable to use the elbow or hand secondary to hemiplegia from a previous stroke.  Of note, she has at pace on a daily basis.  At this point, we will have her continue wearing her Sarmiento.  We will also have her start working on range of motion of the left elbow at pace.  A prescription was written for this.  Follow-up with [place] in 3 weeks time.  Follow-Up Instructions: Return in about 6 weeks (around 04/04/2018).   Orders:  Orders Placed This Encounter  Procedures  . XR Humerus Left   No orders of the defined types were placed in this encounter.   Imaging: No results found.  PMFS History: Patient Active Problem List   Diagnosis Date Noted  . Osteopenia 01/20/2018  . Multiple thyroid nodules 07/13/2017  . Tracheal deviation 07/13/2017  . Malignant neoplasm of upper-outer quadrant of right breast in female, estrogen receptor positive ([place]) 01/20/2017  . Primary vulvar squamous cell carcinoma ([place]) 01/20/2017   Past Medical History:  Diagnosis Date  . Brain aneurysm   . Breast cancer ([place])   . Cancer [facility])    pt. reports that she has cancerous cyst from her back removed "in [place]"   . Depression   . Diabetes mellitus without complication ([place])   . GERD (gastroesophageal reflux disease)   . Glaucoma   . High cholesterol   .  Hypertension   . Pneumonia   . Seizures ([place]) 2011   as a result of spider bite   . Sleep apnea    h/o sleep apnea, used a machine for a time but "they thought I was better & removed the CPAP"   . Stroke [facility]) 1992   weakness on the L UE  . Thyroid mass   . Vertigo     History reviewed. No pertinent family history.  Past Surgical History:  Procedure Laterality Date  . ABDOMINAL SURGERY     pt. reports that she had surgery for uterine cancer  . BRAIN SURGERY  1992   due to aneurysm  . BREAST LUMPECTOMY     right breast  . EYE SURGERY Left    cataract removed   . PICC LINE INSERTION      for antibiotic - treatment following "spider bite"  . THYROID LOBECTOMY Right 07/15/2017   Procedure: RIGHT THYROID LOBECTOMY;  Surgeon: Armandina Gemma, MD;  Location: [place];  Service: General;  Laterality: Right;   Social History   Occupational History  . Not on file  Tobacco Use  . Smoking status: Never Smoker  . Smokeless tobacco: Never Used  Substance and Sexual Activity  . Alcohol use: No  . Drug use: No  . Sexual  activity: No    Birth control/protection: Post-menopausal

## 2018-03-14 ENCOUNTER — Ambulatory Visit (INDEPENDENT_AMBULATORY_CARE_PROVIDER_SITE_OTHER): Payer: Medicare (Managed Care) | Admitting: Orthopaedic Surgery

## 2018-03-14 ENCOUNTER — Ambulatory Visit (INDEPENDENT_AMBULATORY_CARE_PROVIDER_SITE_OTHER): Payer: Medicare (Managed Care)

## 2018-03-14 ENCOUNTER — Encounter (INDEPENDENT_AMBULATORY_CARE_PROVIDER_SITE_OTHER): Payer: Self-pay | Admitting: Orthopaedic Surgery

## 2018-03-14 DIAGNOSIS — S42332A Displaced oblique fracture of shaft of humerus, left arm, initial encounter for closed fracture: Secondary | ICD-10-CM

## 2018-03-14 NOTE — Progress Notes (Signed)
Patient: Kirsten Mcmahon           Date of Birth: 14-Dec-1945           MRN: [id number] Visit Date: 03/14/2018 PCP: Janifer Adie, MD   Assessment & Plan:  Chief Complaint:  Chief Complaint  Patient presents with  . Left Upper Arm - Pain, Follow-up   Visit Diagnoses:  1. Closed displaced oblique fracture of shaft of left humerus, initial encounter     Plan: Patient comes in for follow-up.  6 weeks status post displaced left humeral shaft fracture.  She has been in her Sarmiento brace nonweightbearing.  She has already started pendulum swings at [facility].  She is continued to have marked swelling to the left hand but does wear a sleeve for this.  Overall feeling much better.  Examination of her left upper extremity reveals minimal tenderness over the humeral shaft.  Marked swelling to the left hand.  At this point, we will discontinue the Sarmiento brace.  She will continue working on pendulum swings at [facility].  She will follow-up with [place] in 4 weeks time for repeat evaluation and x-ray.  She will call with concerns or questions in the meantime.  Follow-Up Instructions: Return in about 1 month (around 04/11/2018).   Orders:  Orders Placed This Encounter  Procedures  . XR Humerus Left   No orders of the defined types were placed in this encounter.   Imaging: Xr Humerus Left  Result Date: 03/14/2018 Stable alignment of humeral shaft fracture with abundant callus formation.  There is evidence of bridging bony consolidation   PMFS History: Patient Active Problem List   Diagnosis Date Noted  . Closed displaced oblique fracture of shaft of left humerus 03/14/2018  . Osteopenia 01/20/2018  . Multiple thyroid nodules 07/13/2017  . Tracheal deviation 07/13/2017  . Malignant neoplasm of upper-outer quadrant of right breast in female, estrogen receptor positive ([place]) 01/20/2017  . Primary vulvar squamous cell carcinoma ([place]) 01/20/2017   Past Medical History:  Diagnosis Date  .  Brain aneurysm   . Breast cancer ([place])   . Cancer [facility])    pt. reports that she has cancerous cyst from her back removed "in [place]"   . Depression   . Diabetes mellitus without complication ([place])   . GERD (gastroesophageal reflux disease)   . Glaucoma   . High cholesterol   . Hypertension   . Pneumonia   . Seizures ([place]) 2011   as a result of spider bite   . Sleep apnea    h/o sleep apnea, used a machine for a time but "they thought I was better & removed the CPAP"   . Stroke [facility]) 1992   weakness on the L UE  . Thyroid mass   . Vertigo     History reviewed. No pertinent family history.  Past Surgical History:  Procedure Laterality Date  . ABDOMINAL SURGERY     pt. reports that she had surgery for uterine cancer  . BRAIN SURGERY  1992   due to aneurysm  . BREAST LUMPECTOMY     right breast  . EYE SURGERY Left    cataract removed   . PICC LINE INSERTION      for antibiotic - treatment following "spider bite"  . THYROID LOBECTOMY Right 07/15/2017   Procedure: RIGHT THYROID LOBECTOMY;  Surgeon: Armandina Gemma, MD;  Location: [place];  Service: General;  Laterality: Right;   Social History   Occupational History  .  Not on file  Tobacco Use  . Smoking status: Never Smoker  . Smokeless tobacco: Never Used  Substance and Sexual Activity  . Alcohol use: No  . Drug use: No  . Sexual activity: Never    Birth control/protection: Post-menopausal

## 2018-03-29 ENCOUNTER — Ambulatory Visit
Admission: RE | Admit: 2018-03-29 | Discharge: 2018-03-29 | Disposition: A | Discharge status: Home / Self Care | Payer: Medicare (Managed Care) | Source: Ambulatory Visit | Attending: Internal Medicine | Admitting: Internal Medicine

## 2018-03-29 ENCOUNTER — Ambulatory Visit: Payer: Medicare (Managed Care)

## 2018-03-29 DIAGNOSIS — N6310 Unspecified lump in the right breast, unspecified quadrant: Secondary | ICD-10-CM

## 2018-04-13 ENCOUNTER — Encounter (INDEPENDENT_AMBULATORY_CARE_PROVIDER_SITE_OTHER): Payer: Self-pay | Admitting: Orthopaedic Surgery

## 2018-04-13 ENCOUNTER — Ambulatory Visit (INDEPENDENT_AMBULATORY_CARE_PROVIDER_SITE_OTHER): Payer: Medicare (Managed Care) | Admitting: Orthopaedic Surgery

## 2018-04-13 ENCOUNTER — Ambulatory Visit (INDEPENDENT_AMBULATORY_CARE_PROVIDER_SITE_OTHER): Payer: Medicare (Managed Care)

## 2018-04-13 DIAGNOSIS — S42332A Displaced oblique fracture of shaft of humerus, left arm, initial encounter for closed fracture: Secondary | ICD-10-CM | POA: Diagnosis not present

## 2018-04-13 NOTE — Progress Notes (Signed)
Office Visit Note   Patient: Kirsten Mcmahon           Date of Birth: 08-Jan-1946           MRN: [id number] Visit Date: 04/13/2018              Requested by: Janifer Adie, MD [address] [place], [place] [address] PCP: Janifer Adie, MD   Assessment & Plan: Visit Diagnoses:  1. Closed displaced oblique fracture of shaft of left humerus, initial encounter     Plan: Impression is left humeral shaft fracture.  At this point we will continue to follow her radiographically until union.  Follow-up in 6 weeks with 2 view x-rays of the left humerus.  Follow-Up Instructions: Return in about 6 weeks (around 05/25/2018).   Orders:  Orders Placed This Encounter  Procedures  . XR Humerus Left   No orders of the defined types were placed in this encounter.     Procedures: No procedures performed   Clinical Data: No additional findings.   Subjective: Chief Complaint  Patient presents with  . Left Shoulder - Pain    HUMERUS    Patient is 3/2 months status post left humeral shaft fracture.  She [facility] to [facility].  She is doing physical therapy twice a week.  She has left arm paraplegia from the previous stroke at baseline.   Review of Systems   Objective: Vital Signs: There were no vitals taken for this visit.  Physical Exam  Ortho Exam Left arm exam shows significant swelling of the left hand.  No neurovascular compromise.  Motor and sensory function are at baseline. Specialty Comments:  No specialty comments available.  Imaging: No results found.   PMFS History: Patient Active Problem List   Diagnosis Date Noted  . Closed displaced oblique fracture of shaft of left humerus 03/14/2018  . Osteopenia 01/20/2018  . Multiple thyroid nodules 07/13/2017  . Tracheal deviation 07/13/2017  . Malignant neoplasm of upper-outer quadrant of right breast in female, estrogen receptor positive ([place]) 01/20/2017  . Primary vulvar squamous cell carcinoma ([place]) 01/20/2017    Past Medical History:  Diagnosis Date  . Brain aneurysm   . Breast cancer ([place])   . Cancer [facility])    pt. reports that she has cancerous cyst from her back removed "in [place]   . Depression   . Diabetes mellitus without complication ([place])   . GERD (gastroesophageal reflux disease)   . Glaucoma   . High cholesterol   . Hypertension   . Pneumonia   . Seizures ([place]) 2011   as a result of spider bite   . Sleep apnea    h/o sleep apnea, used a machine for a time but "they thought I was better & removed the CPAP"   . Stroke [facility]) 1992   weakness on the L UE  . Thyroid mass   . Vertigo     History reviewed. No pertinent family history.  Past Surgical History:  Procedure Laterality Date  . ABDOMINAL SURGERY     pt. reports that she had surgery for uterine cancer  . BRAIN SURGERY  1992   due to aneurysm  . BREAST LUMPECTOMY     right breast  . EYE SURGERY Left    cataract removed   . PICC LINE INSERTION      for antibiotic - treatment following "spider bite"  . THYROID LOBECTOMY Right 07/15/2017   Procedure: RIGHT THYROID LOBECTOMY;  Surgeon: Armandina Gemma,  MD;  Location: [place];  Service: General;  Laterality: Right;   Social History   Occupational History  . Not on file  Tobacco Use  . Smoking status: Never Smoker  . Smokeless tobacco: Never Used  Substance and Sexual Activity  . Alcohol use: No  . Drug use: No  . Sexual activity: Never    Birth control/protection: Post-menopausal

## 2018-04-14 ENCOUNTER — Telehealth (INDEPENDENT_AMBULATORY_CARE_PROVIDER_SITE_OTHER): Payer: Self-pay | Admitting: Orthopaedic Surgery

## 2018-04-14 NOTE — Telephone Encounter (Signed)
Kia from [place] called wanting clarification on the splint removal and also wanted to know if Dr. Erlinda Hong wanted them to continue the pendulum exercises? CB # [phone]

## 2018-04-14 NOTE — Telephone Encounter (Signed)
May leave sarmiento brace off.  Continue pendulum and progress with shoulder and elbow ROM

## 2018-04-14 NOTE — Telephone Encounter (Signed)
See message below °

## 2018-04-17 NOTE — Telephone Encounter (Signed)
Tried to call Kia no answer. LMOM to return call.

## 2018-04-18 ENCOUNTER — Telehealth (INDEPENDENT_AMBULATORY_CARE_PROVIDER_SITE_OTHER): Payer: Self-pay | Admitting: Orthopaedic Surgery

## 2018-04-18 NOTE — Telephone Encounter (Signed)
Called again and she was unavailable left message with Crystal with details below.

## 2018-04-18 NOTE — Telephone Encounter (Signed)
[facility] [place] occupational therapist [phone]   Please call back to check on pt care left VM unaware of what's needed

## 2018-04-18 NOTE — Telephone Encounter (Signed)
Kia called again needing specifics of patients needs. Wrong number was left in previous message please call her back about patients care # [phone]

## 2018-05-25 ENCOUNTER — Encounter (INDEPENDENT_AMBULATORY_CARE_PROVIDER_SITE_OTHER): Payer: Self-pay | Admitting: Orthopaedic Surgery

## 2018-05-25 ENCOUNTER — Ambulatory Visit (INDEPENDENT_AMBULATORY_CARE_PROVIDER_SITE_OTHER): Payer: Medicare (Managed Care)

## 2018-05-25 ENCOUNTER — Ambulatory Visit (INDEPENDENT_AMBULATORY_CARE_PROVIDER_SITE_OTHER): Payer: Medicare (Managed Care) | Admitting: Orthopaedic Surgery

## 2018-05-25 ENCOUNTER — Telehealth (INDEPENDENT_AMBULATORY_CARE_PROVIDER_SITE_OTHER): Payer: Self-pay

## 2018-05-25 DIAGNOSIS — S42332A Displaced oblique fracture of shaft of humerus, left arm, initial encounter for closed fracture: Secondary | ICD-10-CM | POA: Diagnosis not present

## 2018-05-25 NOTE — Progress Notes (Signed)
Patient: Kirsten Mcmahon           Date of Birth: 11/24/1946           MRN: [id number] Visit Date: 05/25/2018 PCP: Janifer Adie, MD   Assessment & Plan:  Chief Complaint:  Chief Complaint  Patient presents with  . Left Upper Arm - Follow-up    Humeral shaft fracture - 01/02/18   Visit Diagnoses:  1. Closed displaced oblique fracture of shaft of left humerus, initial encounter     Plan: Patient is a pleasant 72 year old female who presents to our clinic today nearly 5 months status post displaced humeral shaft fracture on the left.  She has been working in therapy.  She has no pain.  She does have left-sided paralysis to the left upper extremity.  She has been wearing a glove for the swelling in her left hand.  Examination of her left shoulder reveals no tenderness to the fracture site.  Moderate swelling to the left hand, but this was prior to injury.  At this point, she will continue with therapy as needed.  She will follow-up with [place] as needed.  Call with concerns or questions in the meantime.  Follow-Up Instructions: Return if symptoms worsen or fail to improve.   Orders:  Orders Placed This Encounter  Procedures  . XR Humerus Left   No orders of the defined types were placed in this encounter.   Imaging: Xr Humerus Left  Result Date: 05/26/2018 Stable alignment of humerus with abundant callus    PMFS History: Patient Active Problem List   Diagnosis Date Noted  . Closed displaced oblique fracture of shaft of left humerus 03/14/2018  . Osteopenia 01/20/2018  . Multiple thyroid nodules 07/13/2017  . Tracheal deviation 07/13/2017  . Malignant neoplasm of upper-outer quadrant of right breast in female, estrogen receptor positive ([place]) 01/20/2017  . Primary vulvar squamous cell carcinoma ([place]) 01/20/2017   Past Medical History:  Diagnosis Date  . Brain aneurysm   . Breast cancer ([place])   . Cancer [facility])    pt. reports that she has cancerous cyst from her  back removed "in [place]"   . Depression   . Diabetes mellitus without complication ([place])   . GERD (gastroesophageal reflux disease)   . Glaucoma   . High cholesterol   . Hypertension   . Pneumonia   . Seizures ([place]) 2011   as a result of spider bite   . Sleep apnea    h/o sleep apnea, used a machine for a time but "they thought I was better & removed the CPAP"   . Stroke [facility]) 1992   weakness on the L UE  . Thyroid mass   . Vertigo     History reviewed. No pertinent family history.  Past Surgical History:  Procedure Laterality Date  . ABDOMINAL SURGERY     pt. reports that she had surgery for uterine cancer  . BRAIN SURGERY  1992   due to aneurysm  . BREAST LUMPECTOMY     right breast  . EYE SURGERY Left    cataract removed   . PICC LINE INSERTION      for antibiotic - treatment following "spider bite"  . THYROID LOBECTOMY Right 07/15/2017   Procedure: RIGHT THYROID LOBECTOMY;  Surgeon: Armandina Gemma, MD;  Location: [place];  Service: General;  Laterality: Right;   Social History   Occupational History  . Not on file  Tobacco Use  . Smoking  status: Never Smoker  . Smokeless tobacco: Never Used  Substance and Sexual Activity  . Alcohol use: No  . Drug use: No  . Sexual activity: Never    Birth control/protection: Post-menopausal

## 2018-05-25 NOTE — Telephone Encounter (Signed)
Error

## 2018-07-20 ENCOUNTER — Ambulatory Visit
Admission: RE | Admit: 2018-07-20 | Discharge: 2018-07-20 | Disposition: A | Discharge status: Home / Self Care | Payer: Medicare (Managed Care) | Source: Ambulatory Visit | Attending: Internal Medicine | Admitting: Internal Medicine

## 2018-07-20 ENCOUNTER — Other Ambulatory Visit: Payer: Self-pay | Admitting: Internal Medicine

## 2018-07-20 DIAGNOSIS — N6320 Unspecified lump in the left breast, unspecified quadrant: Secondary | ICD-10-CM

## 2018-07-20 DIAGNOSIS — N6310 Unspecified lump in the right breast, unspecified quadrant: Secondary | ICD-10-CM

## 2018-07-20 DIAGNOSIS — R0989 Other specified symptoms and signs involving the circulatory and respiratory systems: Secondary | ICD-10-CM

## 2018-07-20 IMAGING — CR DG CHEST 2V
2 series · 2 of 2 positions shown · non-contrast
Comparison: [DATE]

CLINICAL DATA: [place] heard on exam

EXAM:
CHEST - 2 VIEW

[w chest lat]
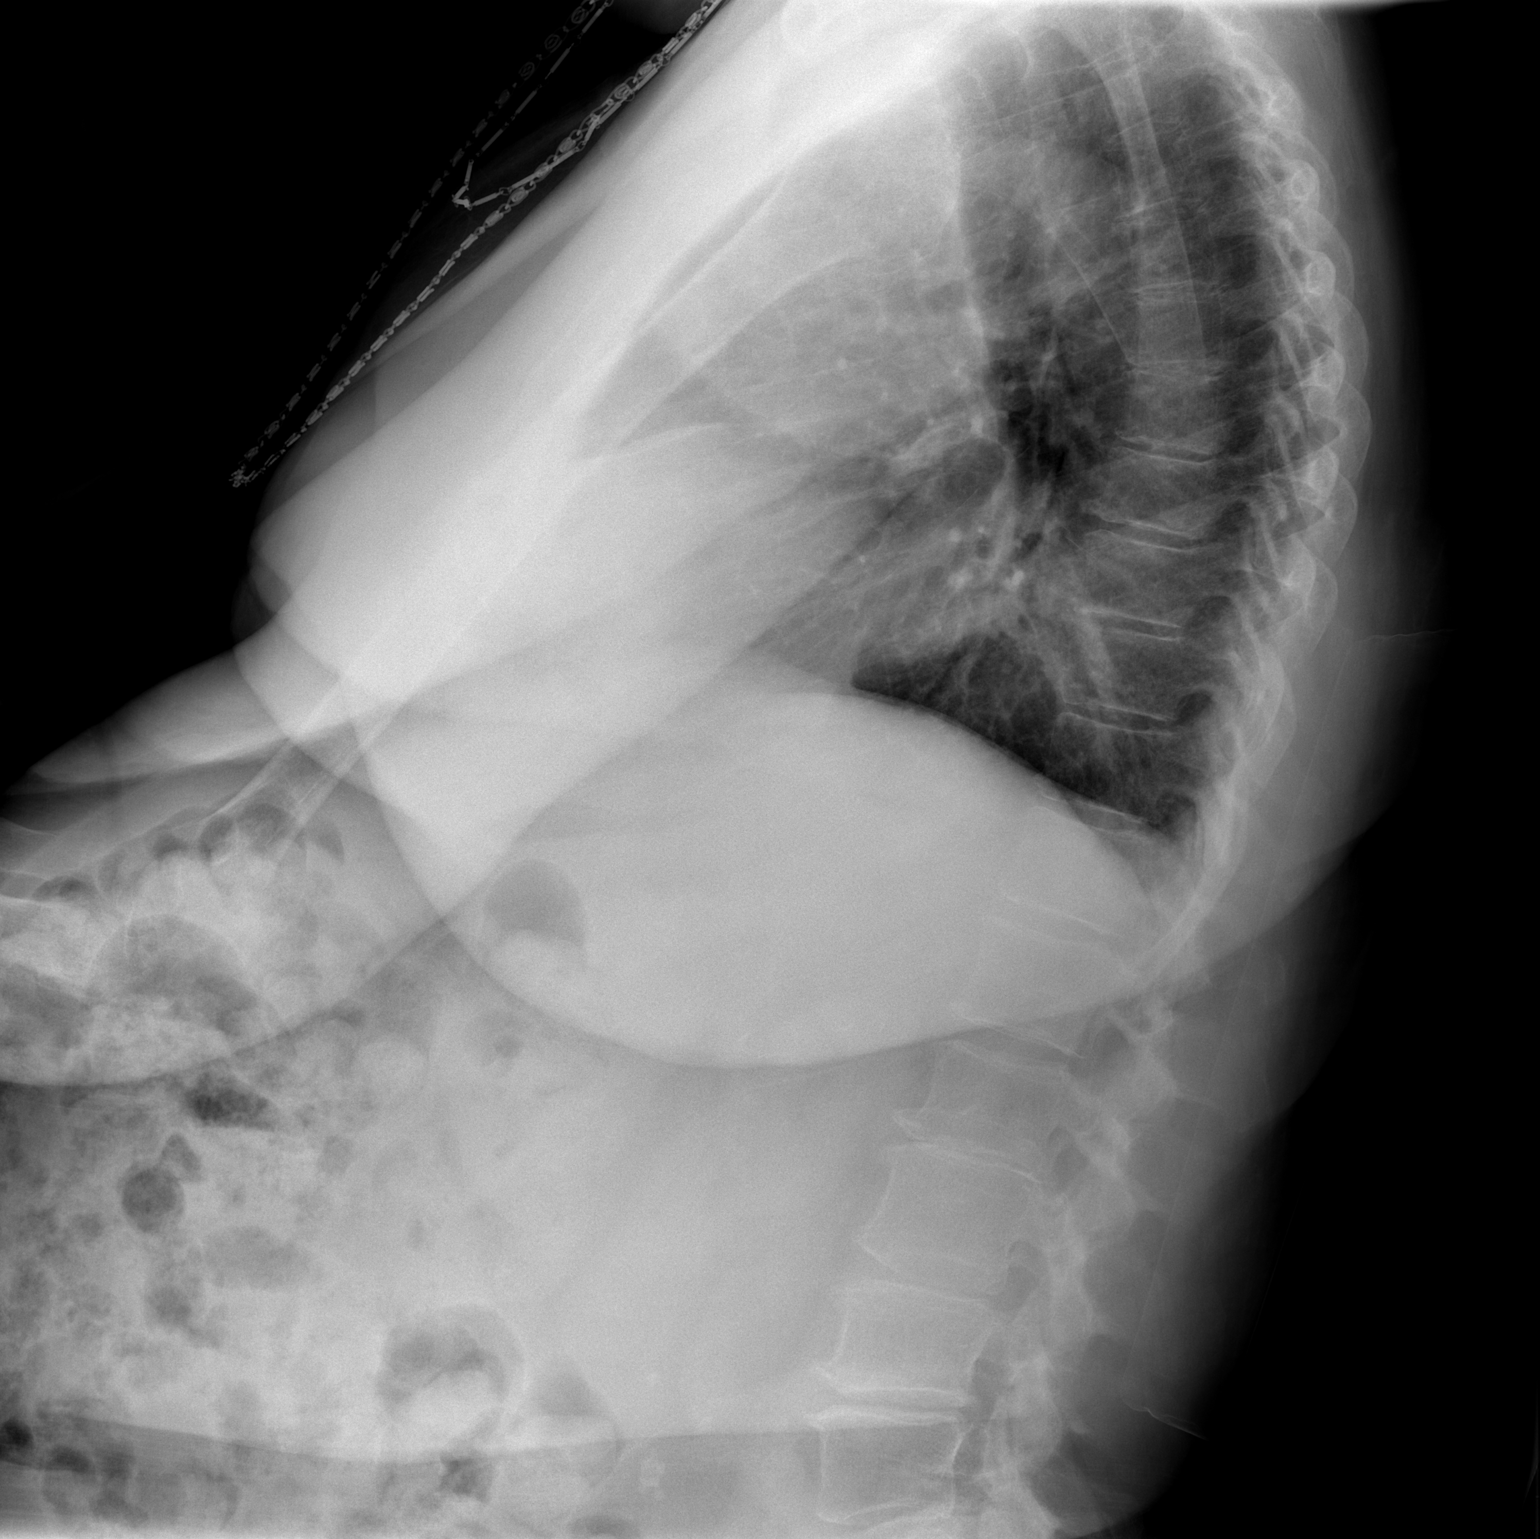

[w chest ap]
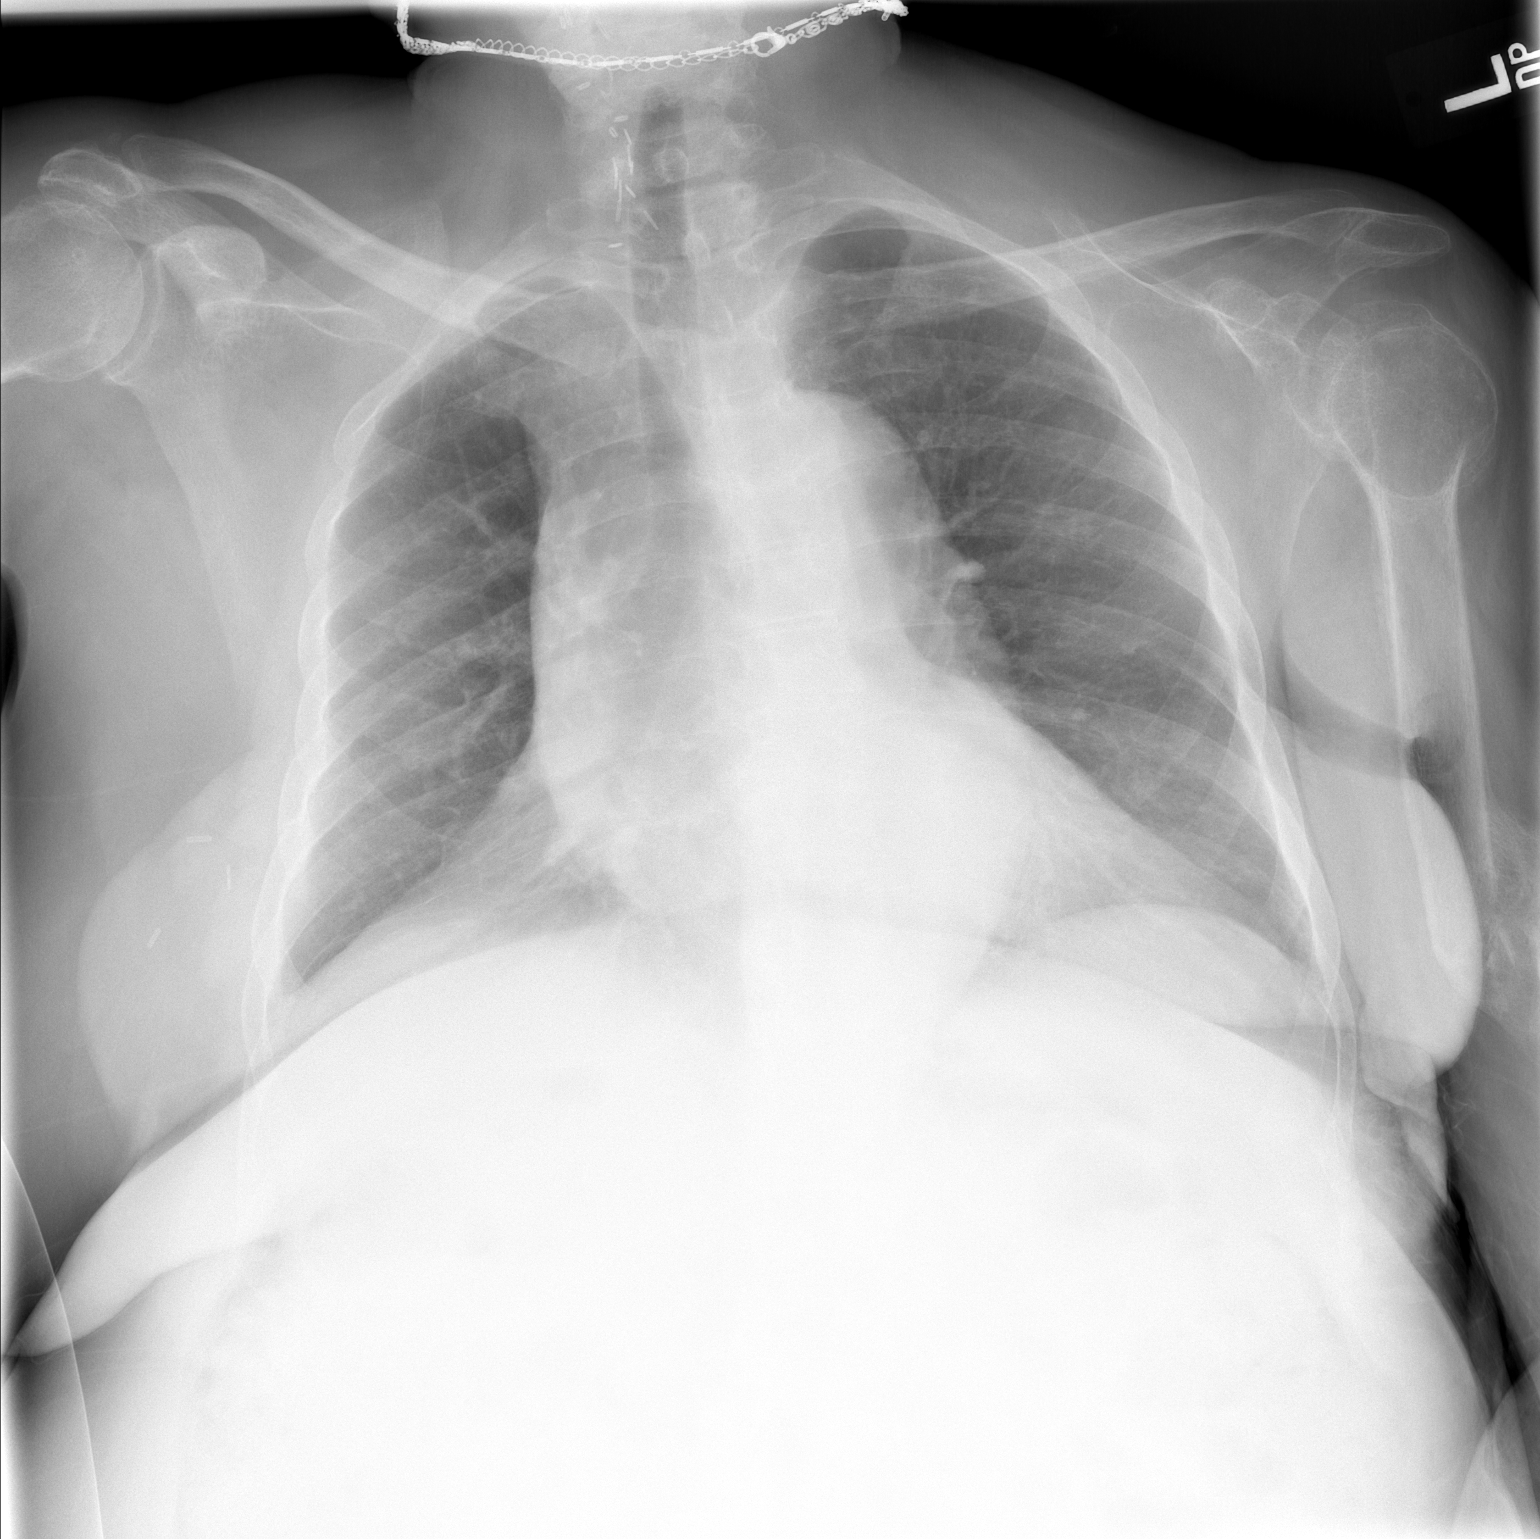

[2 of 2 positions shown; findings below may reference images not displayed]

FINDINGS: Surgical clips at the right neck. Cardiomegaly. No focal airspace
disease or pleural effusion. Stable prominent mediastinal
silhouette. No pneumothorax. Clips over the right chest. The
IMPRESSION: No active cardiopulmonary disease.  Cardiomegaly.

## 2018-07-20 IMAGING — MG DIGITAL DIAGNOSTIC BILATERAL MAMMOGRAM WITH TOMO AND CAD
6 series · 6 of 18 positions shown · non-contrast
Comparison: Previous exam(s).

CLINICAL DATA: Patient is wheelchair bound secondary to stroke.
right breast mass was identified and follow-up was recommended.
Patient has history of right breast lumpectomy. Additionally,
patient had prior ultrasound-guided core needle biopsy left breast
demonstrating sclerotic fibrosis which was felt to be disconcordant
at time of biopsy. MRI was not able to be obtained per patient's
physical condition. Patient is here for follow-up exam.

EXAM:
DIGITAL DIAGNOSTIC BILATERAL MAMMOGRAM WITH CAD AND TOMO
ULTRASOUND BILATERAL BREAST

[R CC synth-2D]
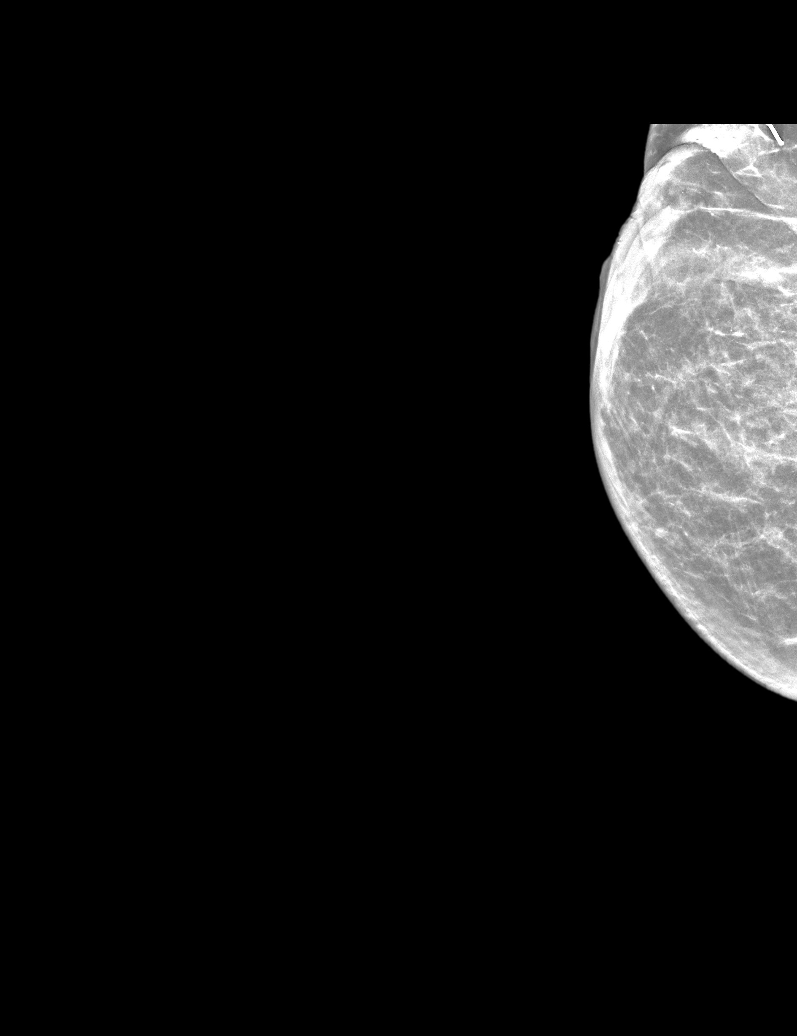

[L CC synth-2D]
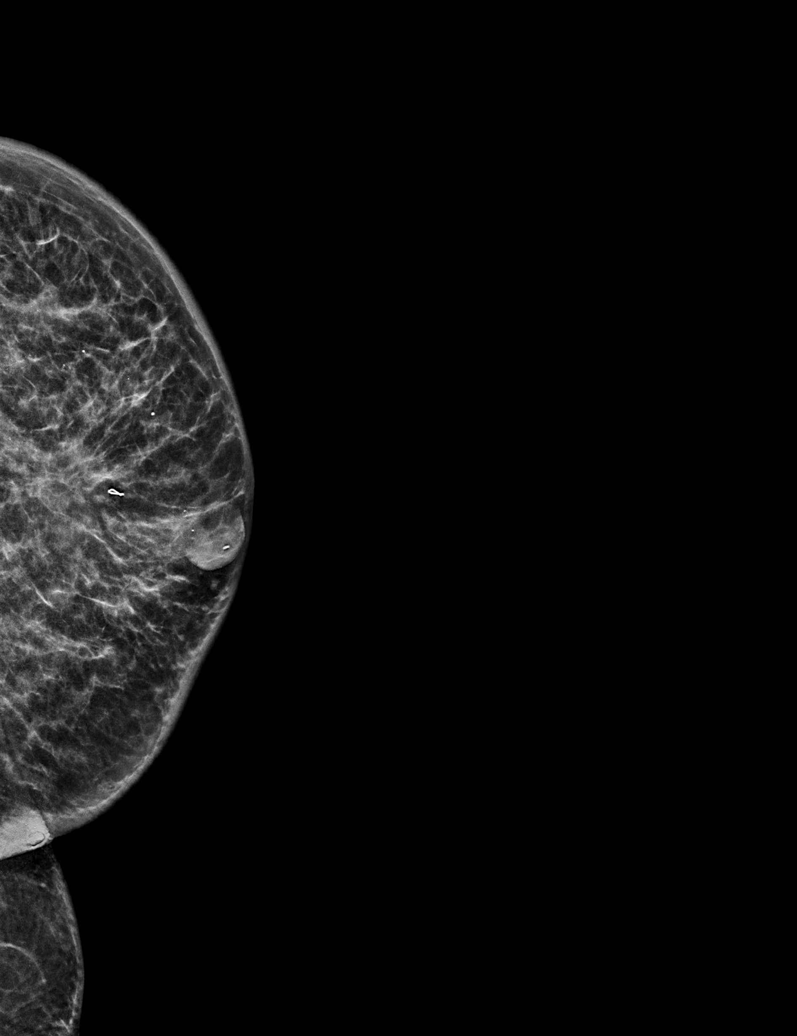

[R MLO synth-2D]
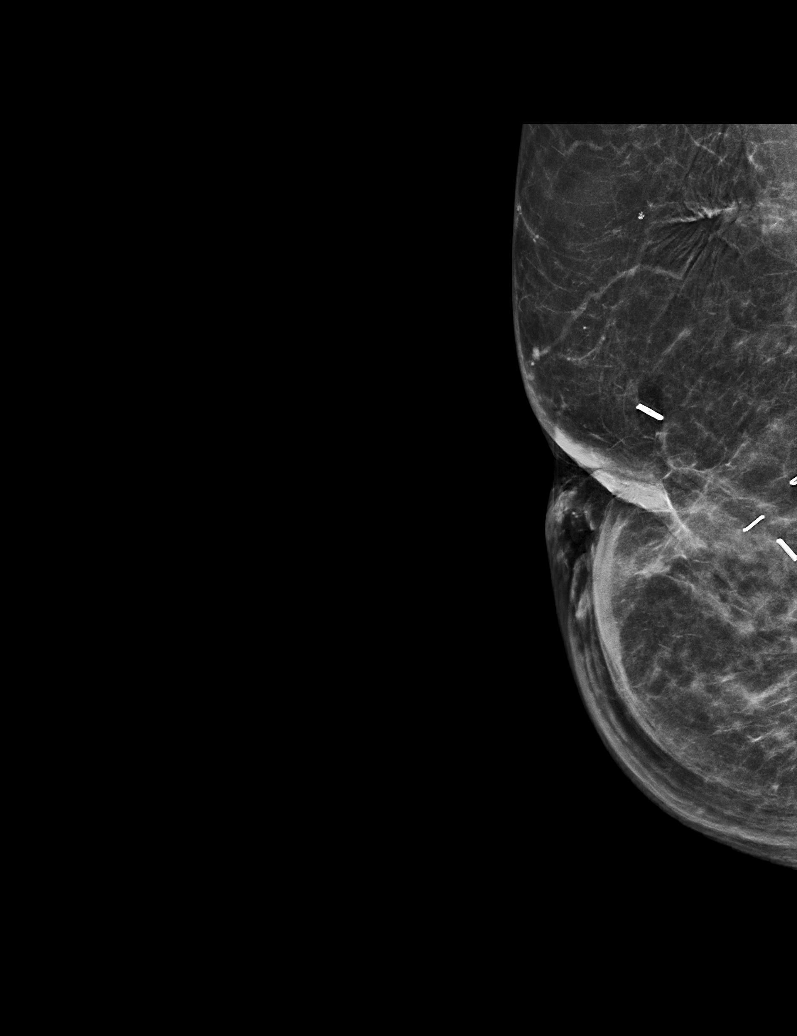

[R CC tomo · tomo slice 25/49.0]
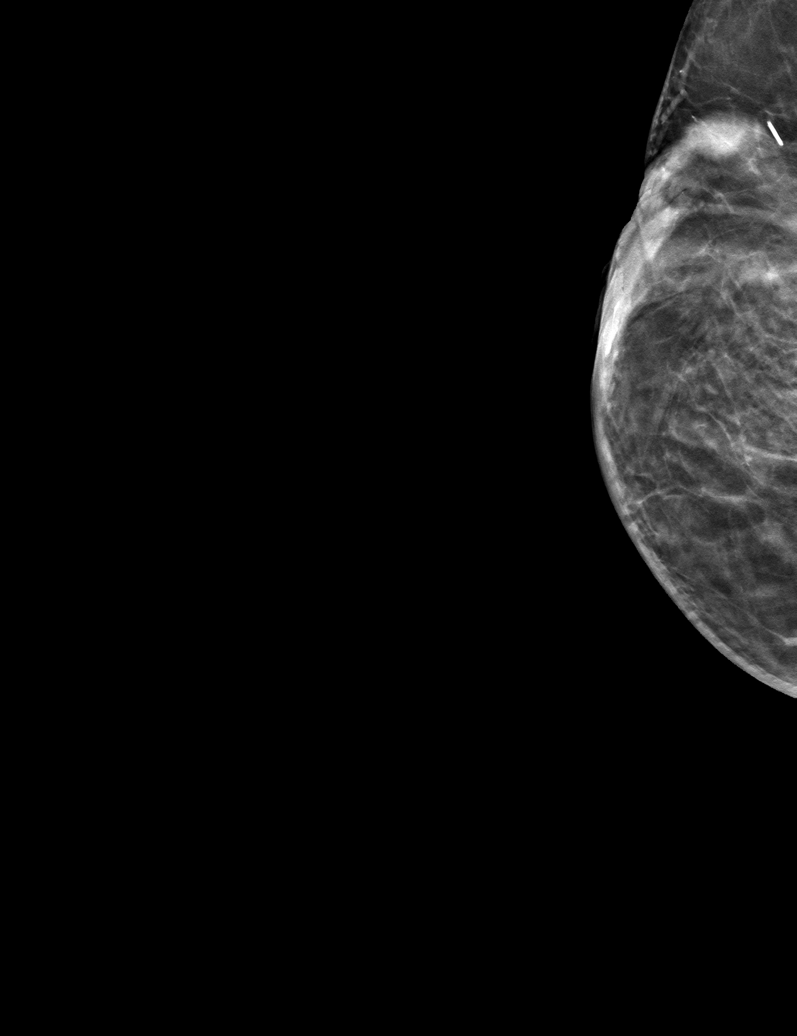

[L CC tomo · tomo slice 23/45.0]
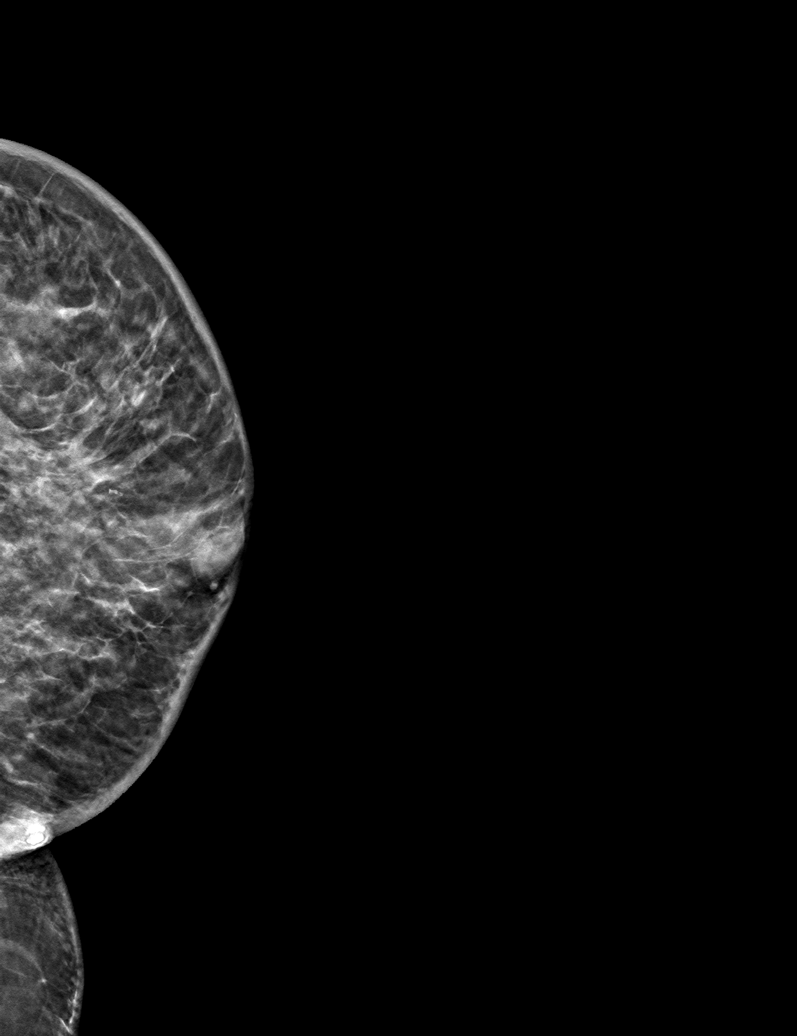

[R MLO tomo · tomo slice 32/63.0]
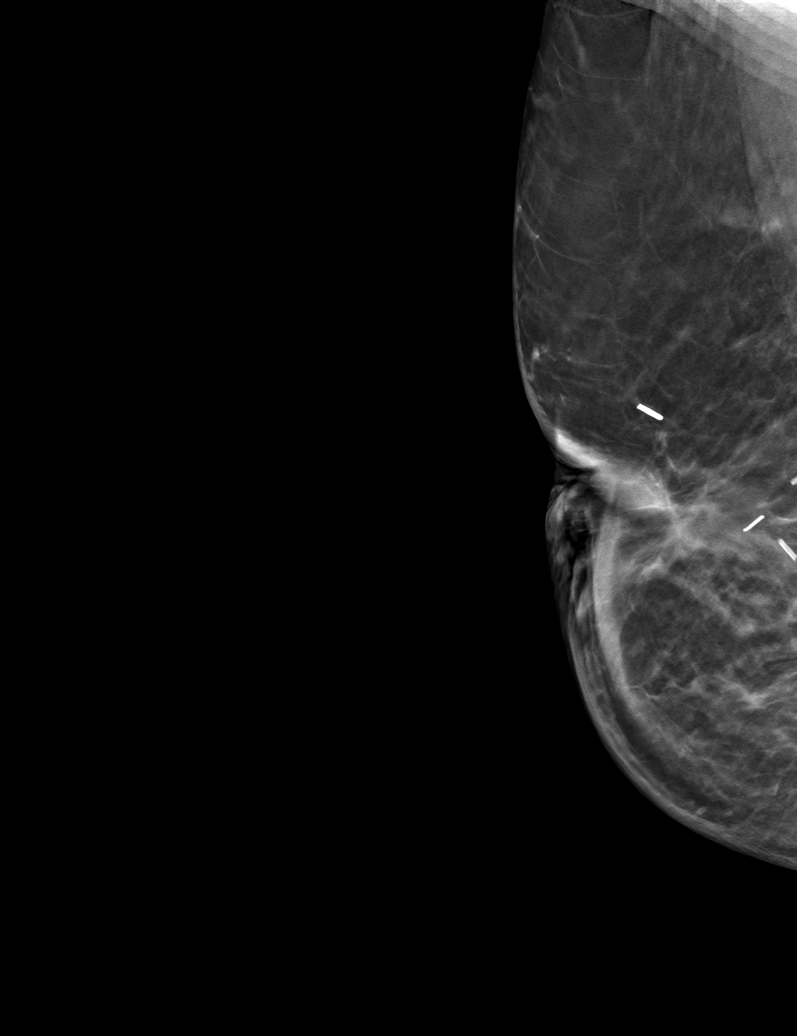

[6 of 18 positions shown; findings below may reference images not displayed]

ACR Breast Density Category c: The breast tissue is heterogeneously
dense, which may obscure small masses.
FINDINGS: Limited mammographic evaluation secondary to patient being in a
wheelchair and inability to raise the left shoulder. No left MLO
view was able to be obtained secondary to patient's inability to
move the left arm.

Re-demonstrated postlumpectomy changes within the right breast. No
suspicious mass identified within the right breast. Persistent
biopsy marking clip located within the central aspect of the left
breast. Additionally within the central left breast there is a focal
area of architectural distortion.

Mammographic images were processed with CAD.

Targeted ultrasound is performed, showing a 1.5 x 1.1 x 1.4 cm
irregular hypoechoic mass left breast 1 o'clock position 4 cm from
nipple. The left axilla was unable to be evaluated due to patient's
physical condition.

Within the right breast 11 o'clock position 4 cm from the nipple
there is a 6 x 2 x 9 mm oval circumscribed hypoechoic mass,
unchanged from prior were it measured 9 x 4 x 7 mm.
IMPRESSION: 1. Suspicious irregular hypoechoic mass left breast 1 o'clock
position 4 cm from the nipple.
2. Stable probably benign right breast mass.
3. Stable right breast postlumpectomy changes.

RECOMMENDATION:
1. Ultrasound-guided core needle biopsy left breast mass 1 o'clock
position 4 cm from the nipple.

I have discussed the findings and recommendations with the patient.
Results were also provided in writing at the conclusion of the
visit. If applicable, a reminder letter will be sent to the patient
regarding the next appointment.

BI-RADS CATEGORY  4: Suspicious.

## 2018-07-20 IMAGING — US ULTRASOUND LEFT BREAST LIMITED
1 series · 4 of 4 positions shown · non-contrast
Comparison: Previous exam(s).

CLINICAL DATA: Patient is wheelchair bound secondary to stroke.
right breast mass was identified and follow-up was recommended.
Patient has history of right breast lumpectomy. Additionally,
patient had prior ultrasound-guided core needle biopsy left breast
demonstrating sclerotic fibrosis which was felt to be disconcordant
at time of biopsy. MRI was not able to be obtained per patient's
physical condition. Patient is here for follow-up exam.

EXAM:
DIGITAL DIAGNOSTIC BILATERAL MAMMOGRAM WITH CAD AND TOMO
ULTRASOUND BILATERAL BREAST

[Series 1: ultrasound left breast limited · 0.07mm/px · 4 of 4 slices shown]
[im 1/4]
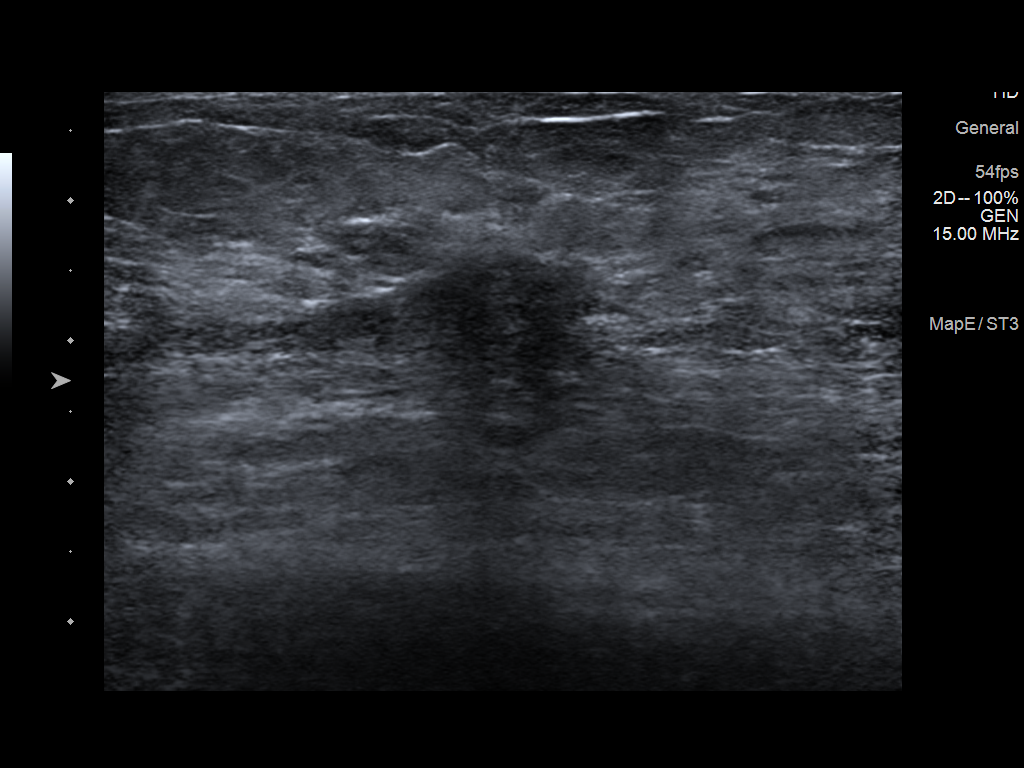
[im 2/4]
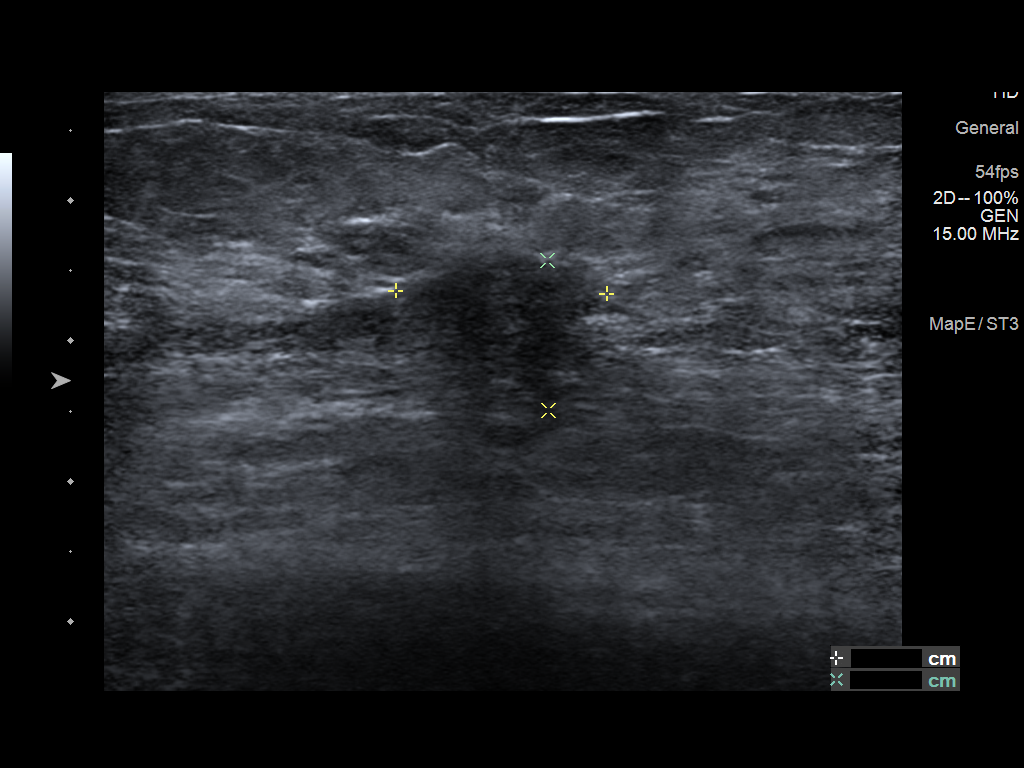
[im 3/4]
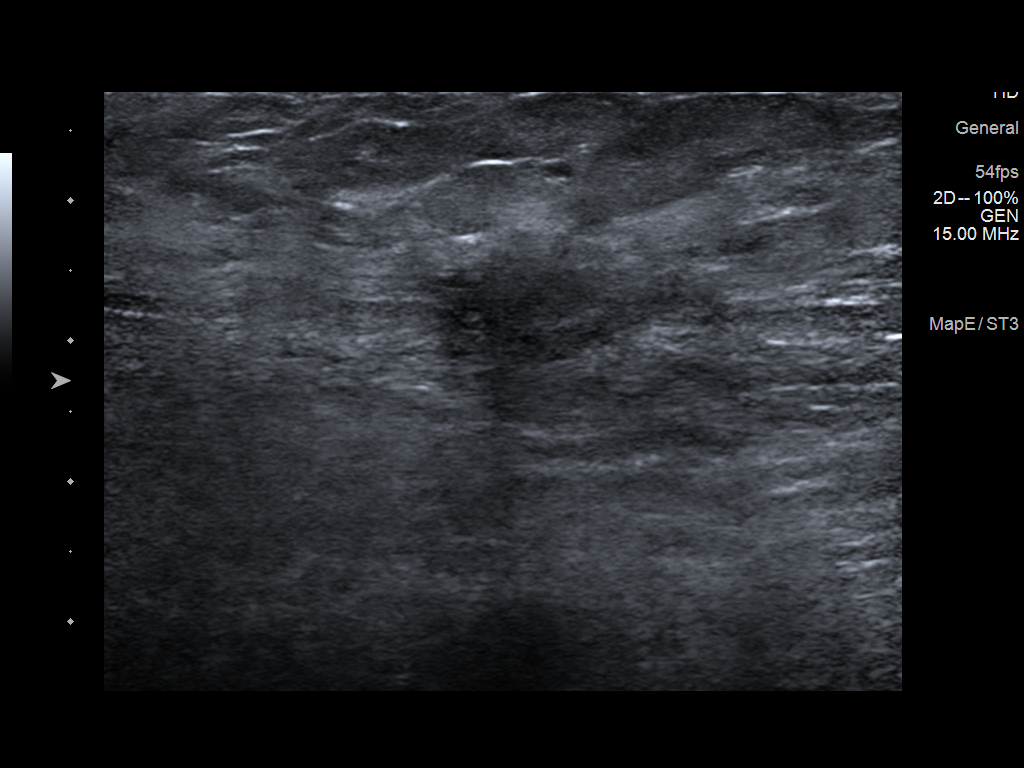
[im 4/4]
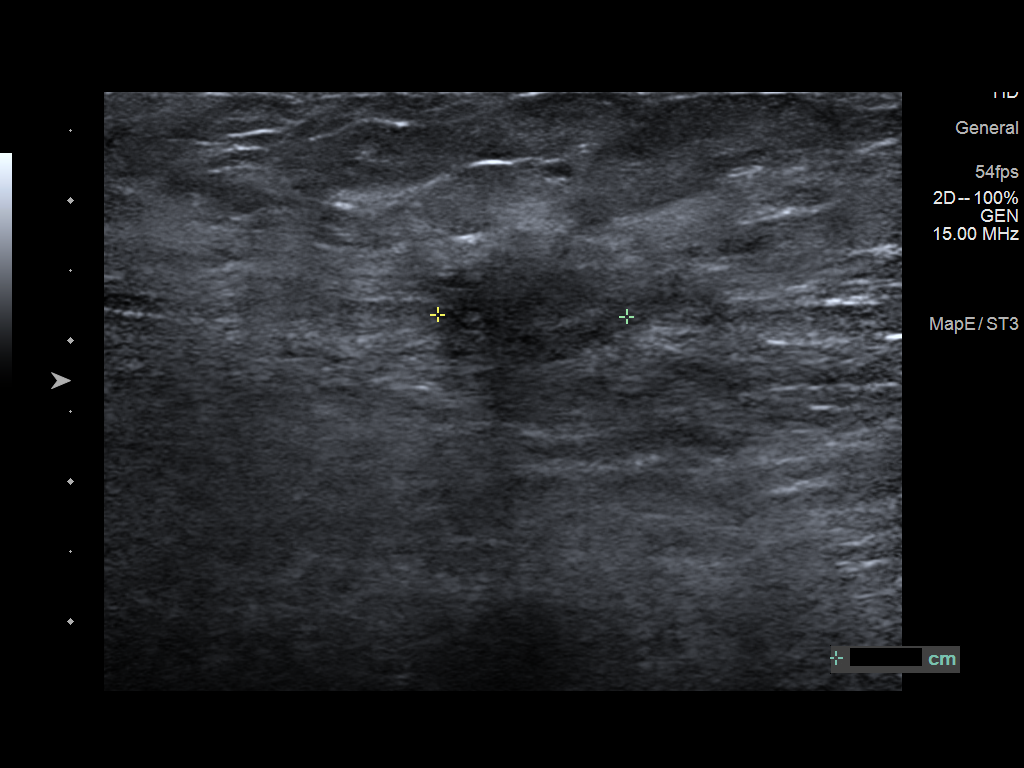

[4 of 4 positions shown; findings below may reference images not displayed]

ACR Breast Density Category c: The breast tissue is heterogeneously
dense, which may obscure small masses.
FINDINGS: Limited mammographic evaluation secondary to patient being in a
wheelchair and inability to raise the left shoulder. No left MLO
view was able to be obtained secondary to patient's inability to
move the left arm.

Re-demonstrated postlumpectomy changes within the right breast. No
suspicious mass identified within the right breast. Persistent
biopsy marking clip located within the central aspect of the left
breast. Additionally within the central left breast there is a focal
area of architectural distortion.

Mammographic images were processed with CAD.

Targeted ultrasound is performed, showing a 1.5 x 1.1 x 1.4 cm
irregular hypoechoic mass left breast 1 o'clock position 4 cm from
nipple. The left axilla was unable to be evaluated due to patient's
physical condition.

Within the right breast 11 o'clock position 4 cm from the nipple
there is a 6 x 2 x 9 mm oval circumscribed hypoechoic mass,
unchanged from prior were it measured 9 x 4 x 7 mm.
IMPRESSION: 1. Suspicious irregular hypoechoic mass left breast 1 o'clock
position 4 cm from the nipple.
2. Stable probably benign right breast mass.
3. Stable right breast postlumpectomy changes.

RECOMMENDATION:
1. Ultrasound-guided core needle biopsy left breast mass 1 o'clock
position 4 cm from the nipple.

I have discussed the findings and recommendations with the patient.
Results were also provided in writing at the conclusion of the
visit. If applicable, a reminder letter will be sent to the patient
regarding the next appointment.

BI-RADS CATEGORY  4: Suspicious.

## 2018-07-20 IMAGING — US ULTRASOUND RIGHT BREAST LIMITED
1 series · 12 of 12 positions shown · non-contrast
Comparison: Previous exam(s).

CLINICAL DATA: Patient is wheelchair bound secondary to stroke.
right breast mass was identified and follow-up was recommended.
Patient has history of right breast lumpectomy. Additionally,
patient had prior ultrasound-guided core needle biopsy left breast
demonstrating sclerotic fibrosis which was felt to be disconcordant
at time of biopsy. MRI was not able to be obtained per patient's
physical condition. Patient is here for follow-up exam.

EXAM:
DIGITAL DIAGNOSTIC BILATERAL MAMMOGRAM WITH CAD AND TOMO
ULTRASOUND BILATERAL BREAST

[Series 1: ultrasound right breast limited · 0.06mm/px · 12 of 12 slices shown]
[im 1/12]
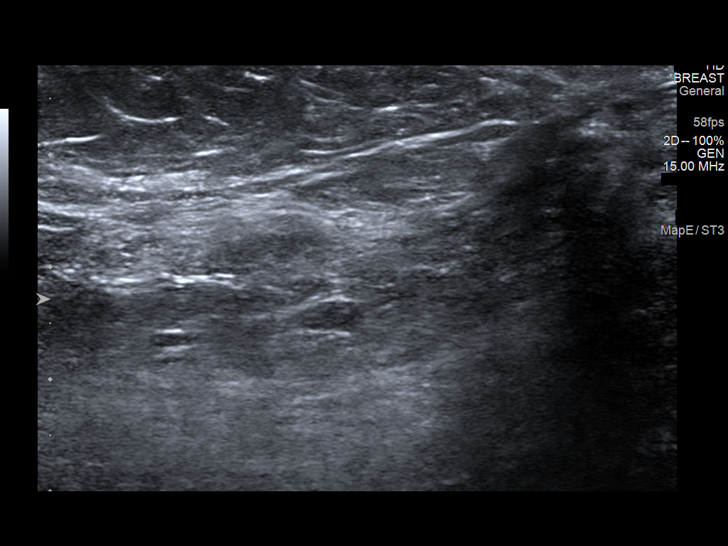
[im 2/12]
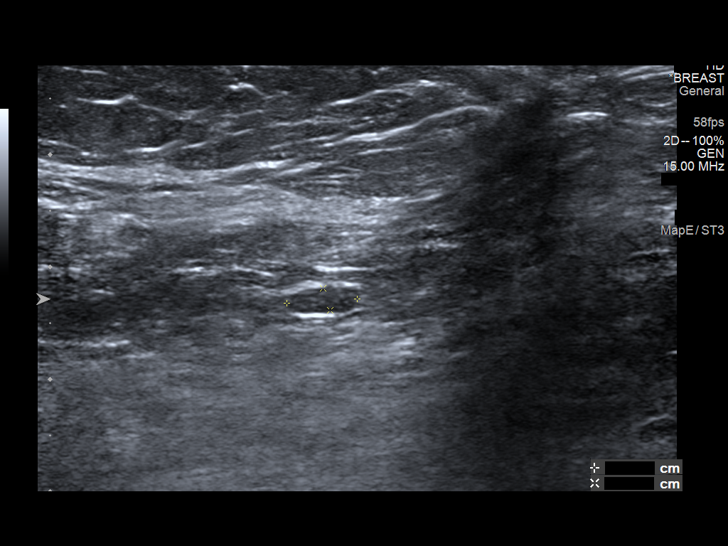
[im 3/12]
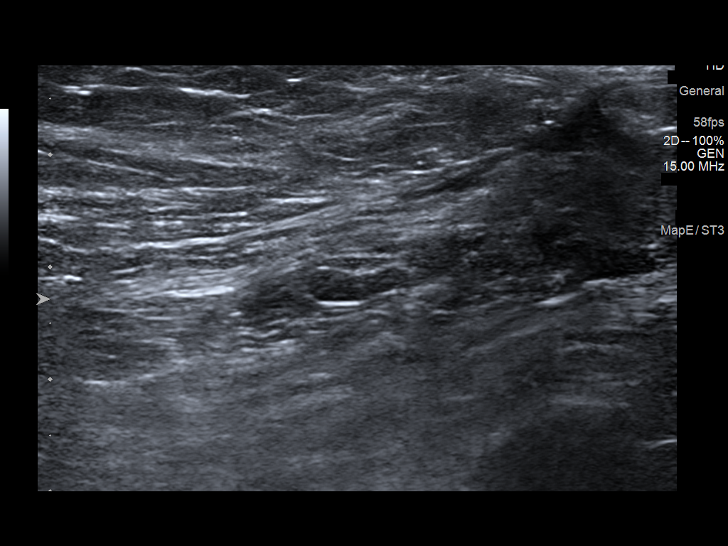
[im 4/12]
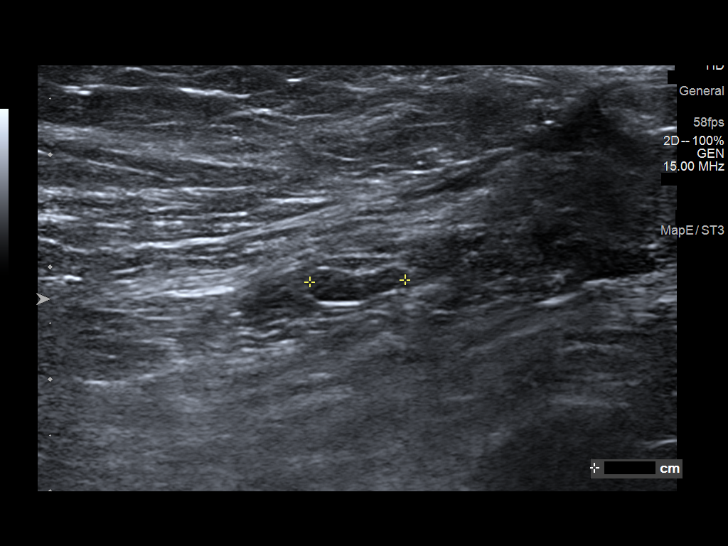
[im 5/12]
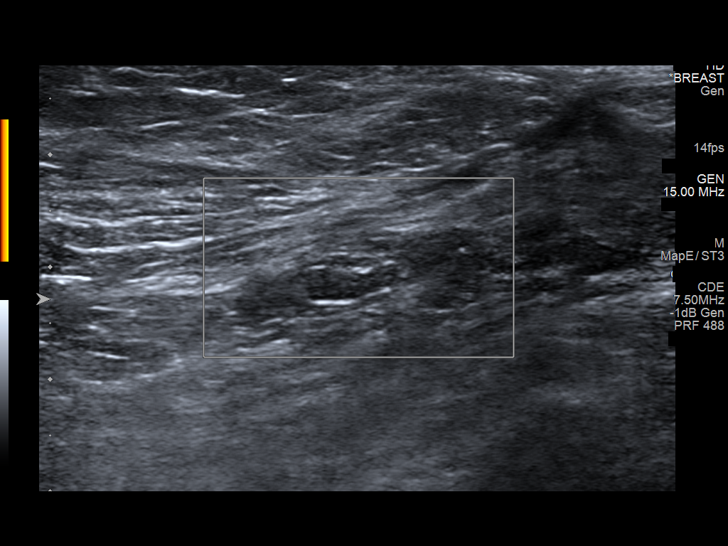
[im 6/12]
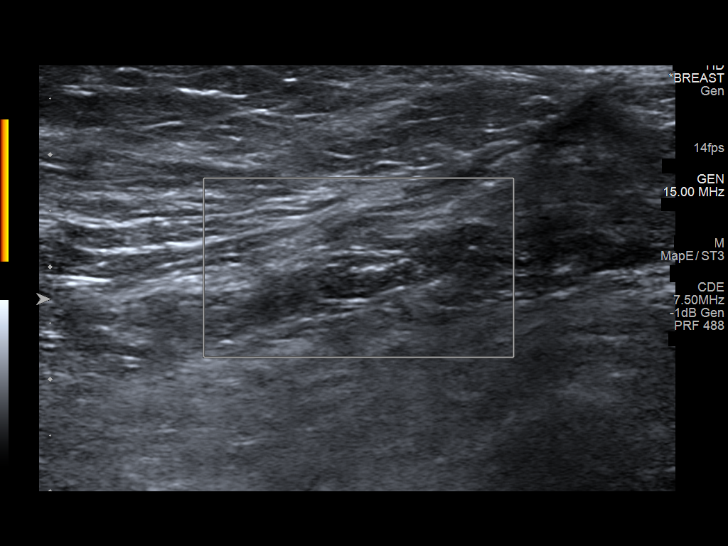
[im 7/12]
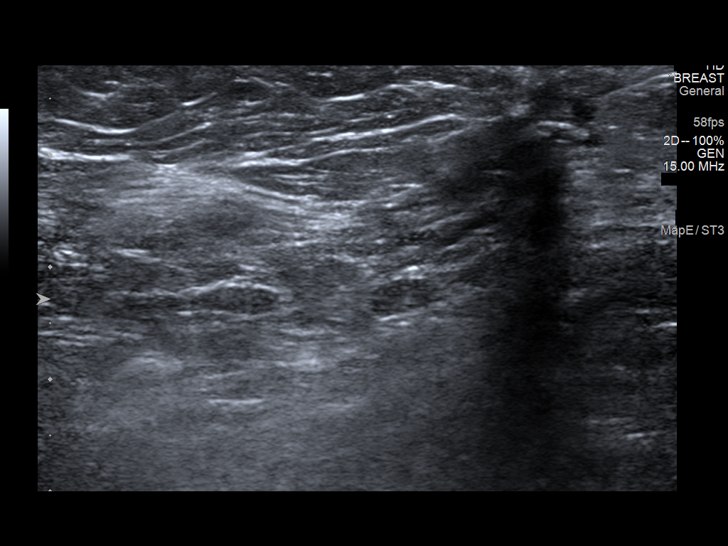
[im 8/12]
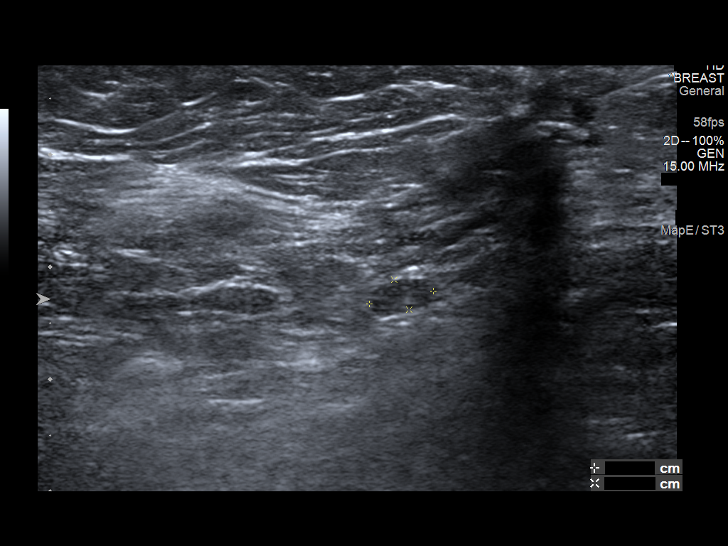
[im 9/12]
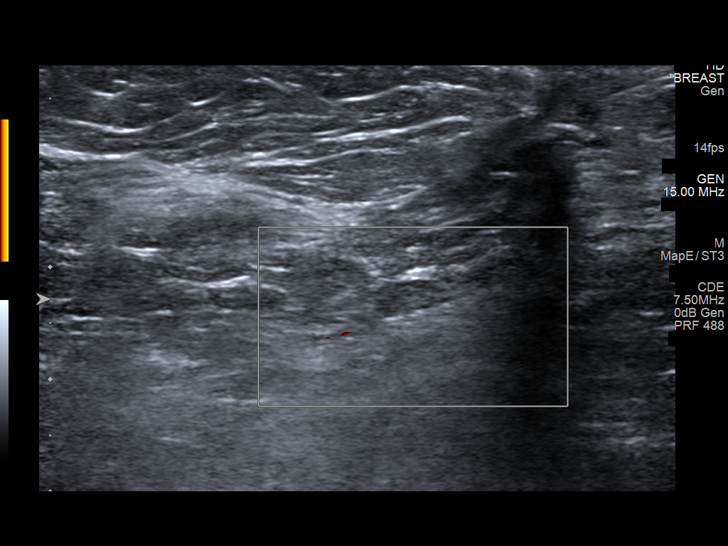
[im 10/12]
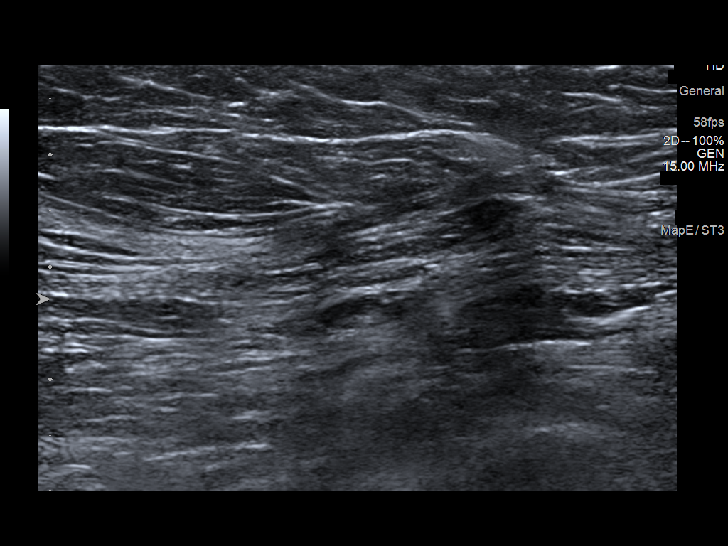
[im 11/12]
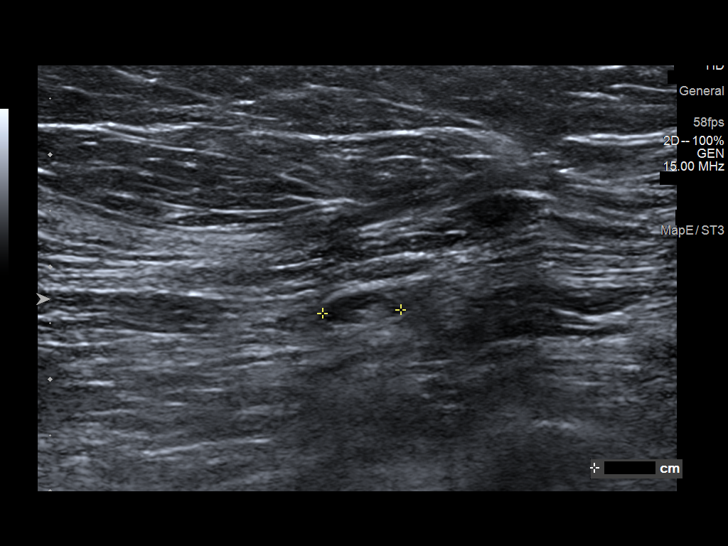
[im 12/12]
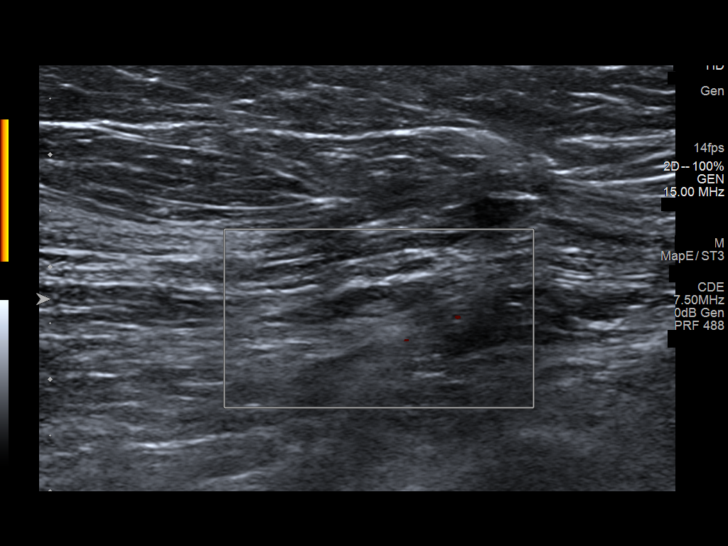

[12 of 12 positions shown; findings below may reference images not displayed]

ACR Breast Density Category c: The breast tissue is heterogeneously
dense, which may obscure small masses.
FINDINGS: Limited mammographic evaluation secondary to patient being in a
wheelchair and inability to raise the left shoulder. No left MLO
view was able to be obtained secondary to patient's inability to
move the left arm.

Re-demonstrated postlumpectomy changes within the right breast. No
suspicious mass identified within the right breast. Persistent
biopsy marking clip located within the central aspect of the left
breast. Additionally within the central left breast there is a focal
area of architectural distortion.

Mammographic images were processed with CAD.

Targeted ultrasound is performed, showing a 1.5 x 1.1 x 1.4 cm
irregular hypoechoic mass left breast 1 o'clock position 4 cm from
nipple. The left axilla was unable to be evaluated due to patient's
physical condition.

Within the right breast 11 o'clock position 4 cm from the nipple
there is a 6 x 2 x 9 mm oval circumscribed hypoechoic mass,
unchanged from prior were it measured 9 x 4 x 7 mm.
IMPRESSION: 1. Suspicious irregular hypoechoic mass left breast 1 o'clock
position 4 cm from the nipple.
2. Stable probably benign right breast mass.
3. Stable right breast postlumpectomy changes.

RECOMMENDATION:
1. Ultrasound-guided core needle biopsy left breast mass 1 o'clock
position 4 cm from the nipple.

I have discussed the findings and recommendations with the patient.
Results were also provided in writing at the conclusion of the
visit. If applicable, a reminder letter will be sent to the patient
regarding the next appointment.

BI-RADS CATEGORY  4: Suspicious.

## 2018-07-21 ENCOUNTER — Ambulatory Visit
Admission: RE | Admit: 2018-07-21 | Discharge: 2018-07-21 | Disposition: A | Discharge status: Home / Self Care | Payer: Medicare (Managed Care) | Source: Ambulatory Visit | Attending: Internal Medicine | Admitting: Internal Medicine

## 2018-07-21 DIAGNOSIS — N6320 Unspecified lump in the left breast, unspecified quadrant: Secondary | ICD-10-CM

## 2018-07-21 IMAGING — MG MM BREAST LOCALIZATION CLIP
1 series · 1 of 1 positions shown · non-contrast
Comparison: Previous exam(s).

CLINICAL DATA: Post ultrasound-guided core needle biopsy of left
breast 1 o'clock mass.

EXAM:
DIAGNOSTIC LEFT MAMMOGRAM POST ULTRASOUND BIOPSY

[L CC]
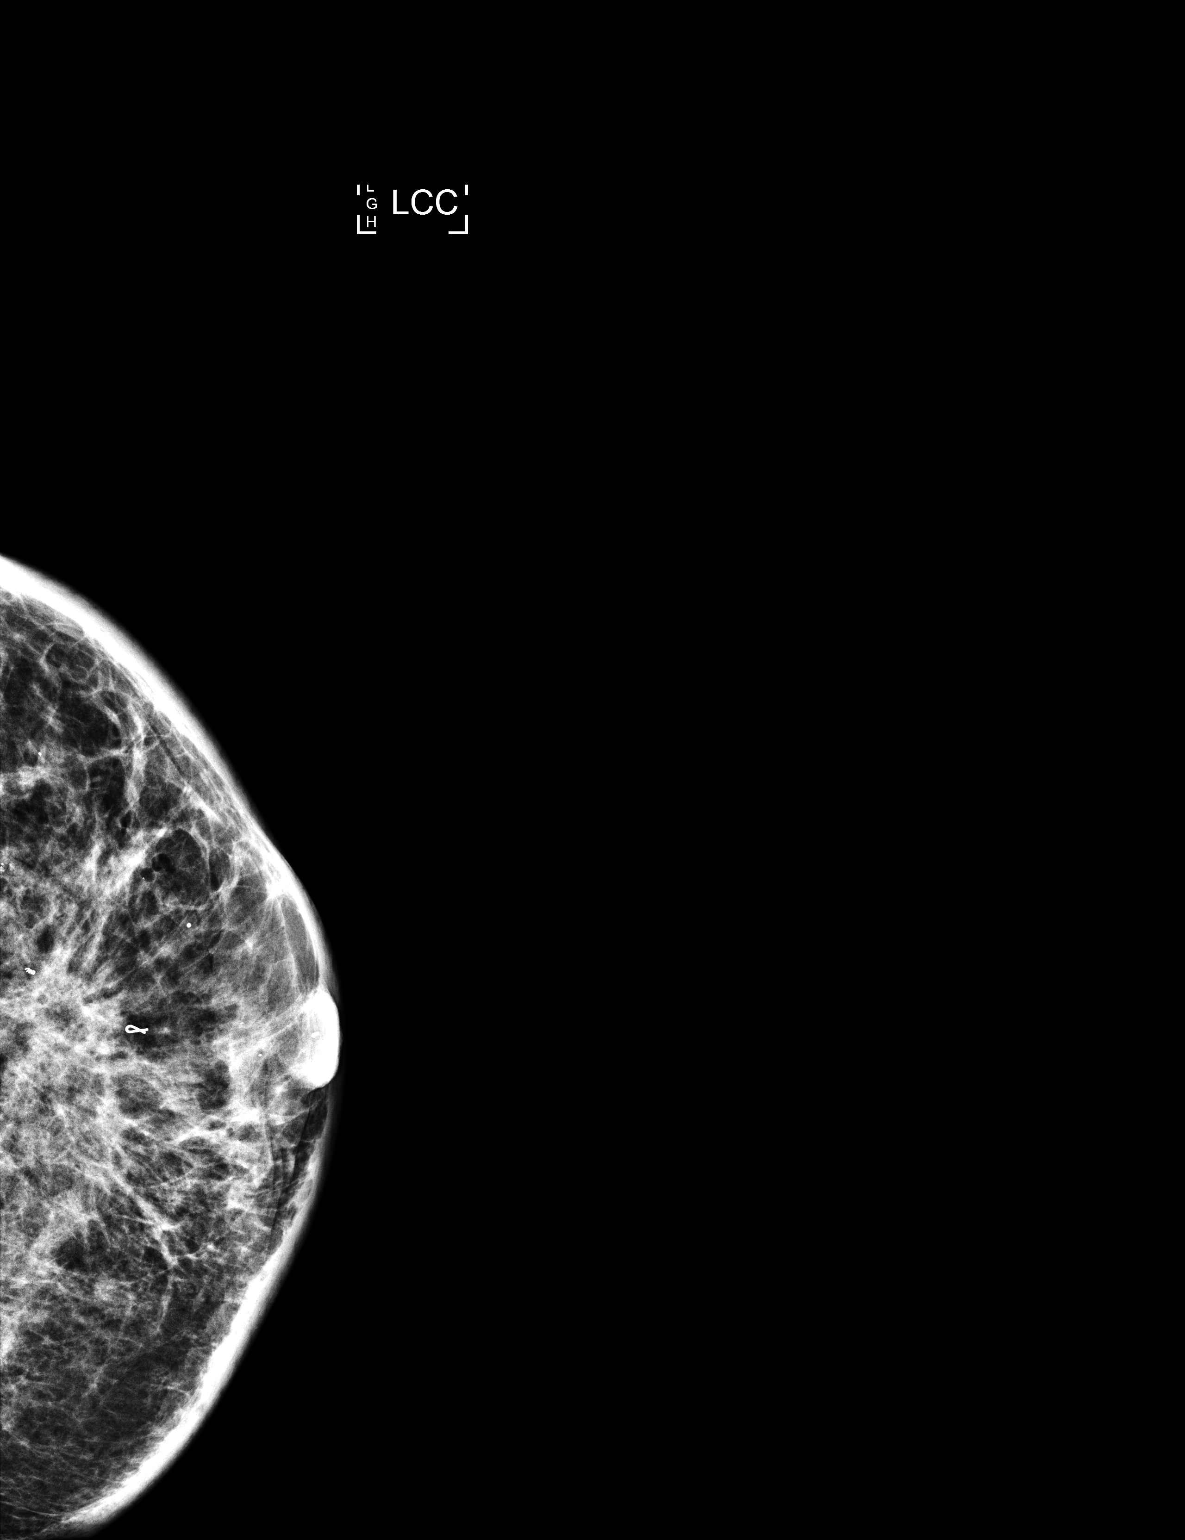

[1 of 1 positions shown; findings below may reference images not displayed]

FINDINGS: Mammographic images were obtained following ultrasound guided biopsy
of left breast 1 o'clock mass. Craniocaudal mammographic view
demonstrates presence of spiral HydroMARK at the lateral border of
the previously identified asymmetry. MLO view was not attempted due
to patient's physical limitations.
IMPRESSION: Successful placement of HydroMARK, post ultrasound-guided core
needle biopsy of left breast 1 o'clock mass.

Final Assessment: Post Procedure Mammograms for Marker Placement

## 2018-07-21 IMAGING — US US BREAST BX W LOC DEV 1ST LESION IMG BX SPEC US GUIDE*L*
1 series · 12 of 12 positions shown · non-contrast
Comparison: Previous exam(s).

ADDENDUM:
Pathology revealed BENIGN FIBROADIPOSE TISSUE WITH SCLEROSING
FIBROSIS of LEFT breast, 1 o'clock. This was found to be
disconcordant by Dr. YEI, with excision recommended.

Pathology results were discussed with the patient by telephone. The
patient reported doing well after the biopsy with tenderness at the
site. Post biopsy instructions and care were reviewed and questions
were answered. The patient was encouraged to call The [REDACTED]
I have spoken with Dr YEI of [REDACTED],
[HOSPITAL][HOSPITAL], with the biopsy results. Dr [REDACTED] referral to Dr YEI of [REDACTED],
Pathology results reported by YEI, RN on [DATE].
CLINICAL DATA: Left breast 1 o'clock mass.
EXAM:
ULTRASOUND GUIDED LEFT BREAST CORE NEEDLE BIOPSY

[Series 1: us breast bx w loc dev 1st lesion img bx spec us g · 0.06mm/px · 12 of 12 slices shown]
[im 1/12]
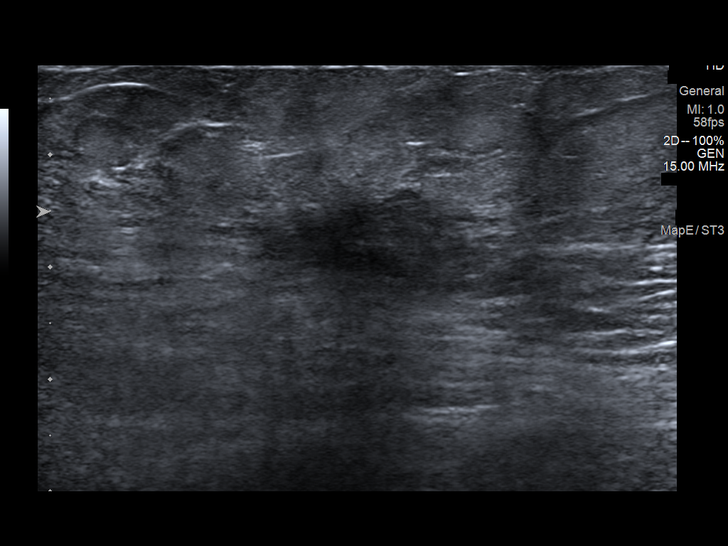
[im 2/12]
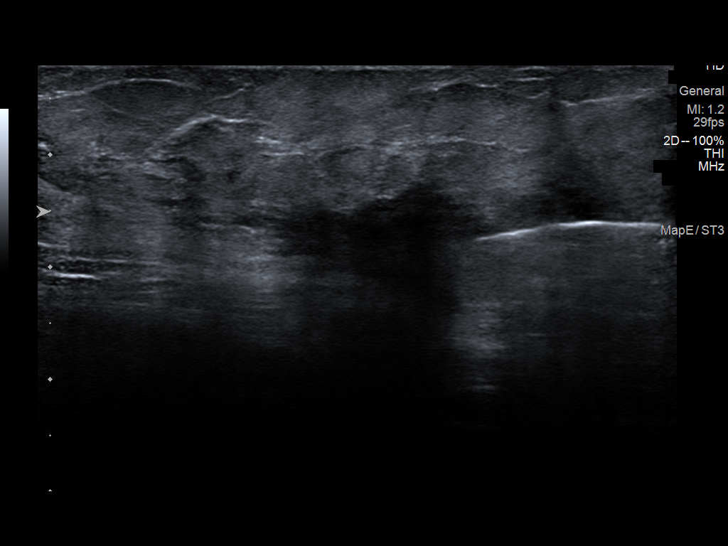
[im 3/12]
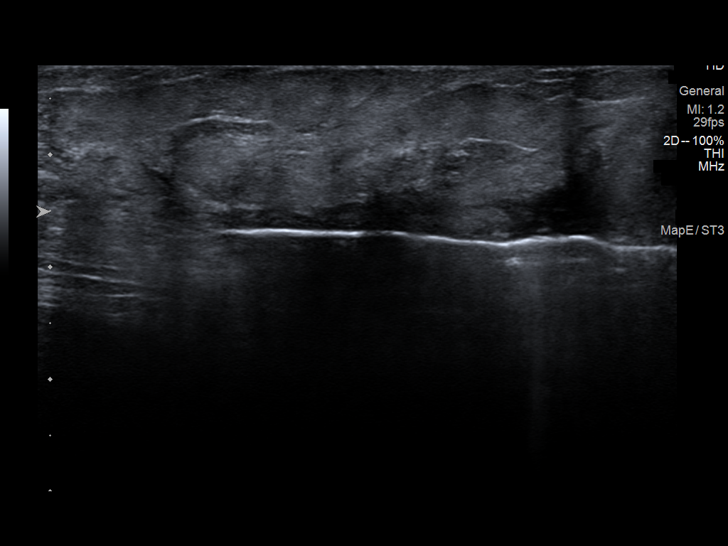
[im 4/12]
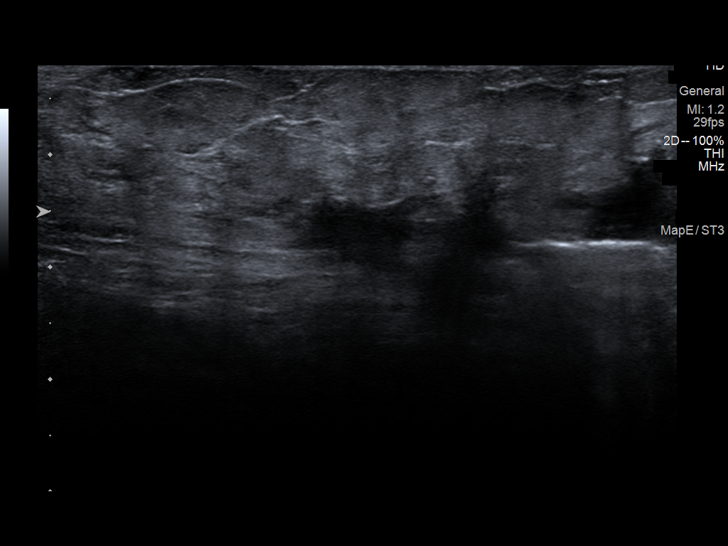
[im 5/12]
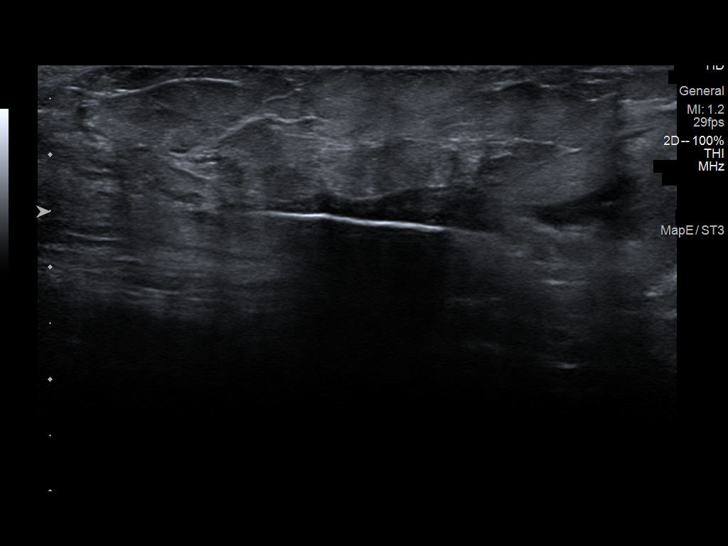
[im 6/12]
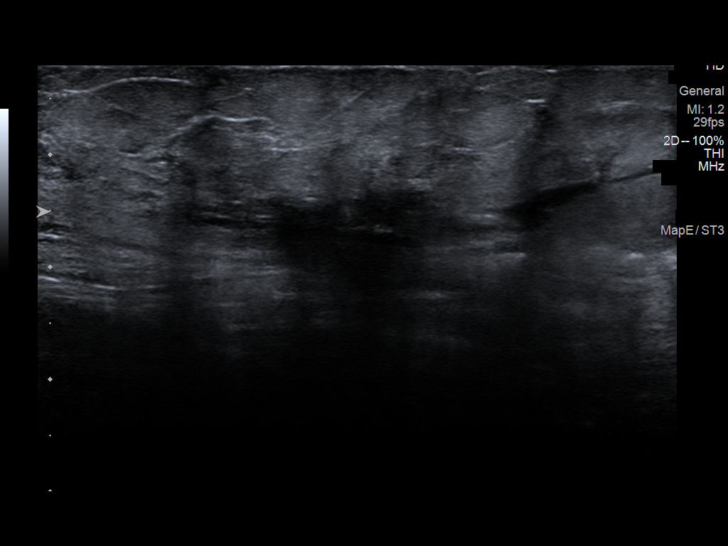
[im 7/12]
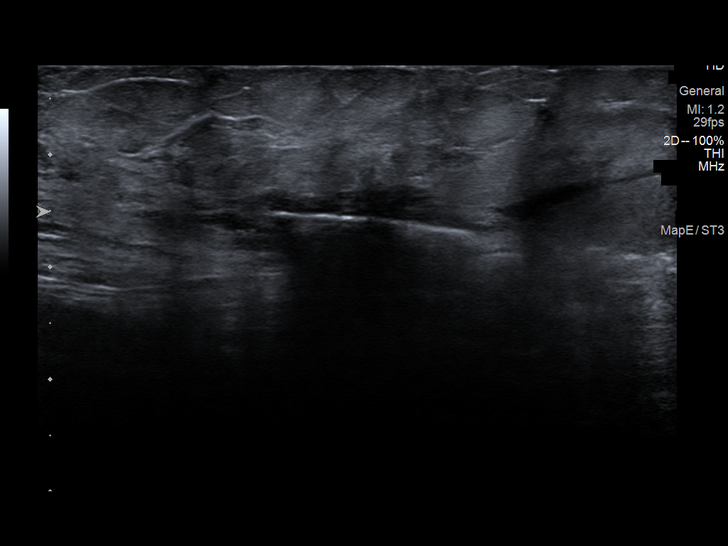
[im 8/12]
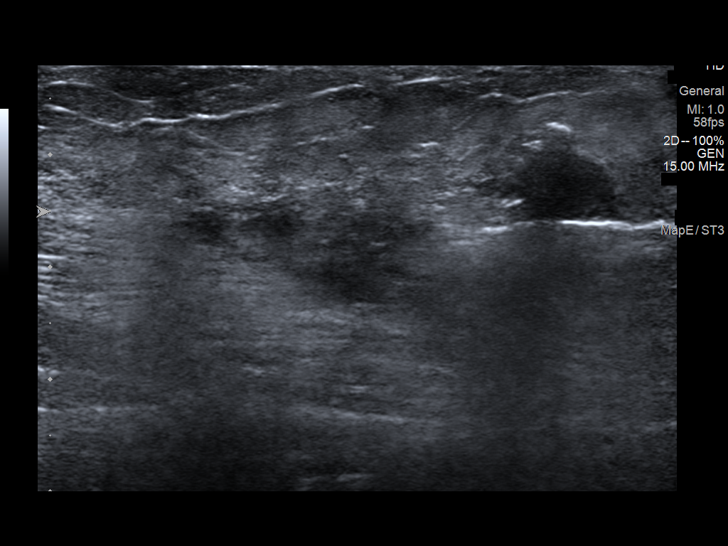
[im 9/12]
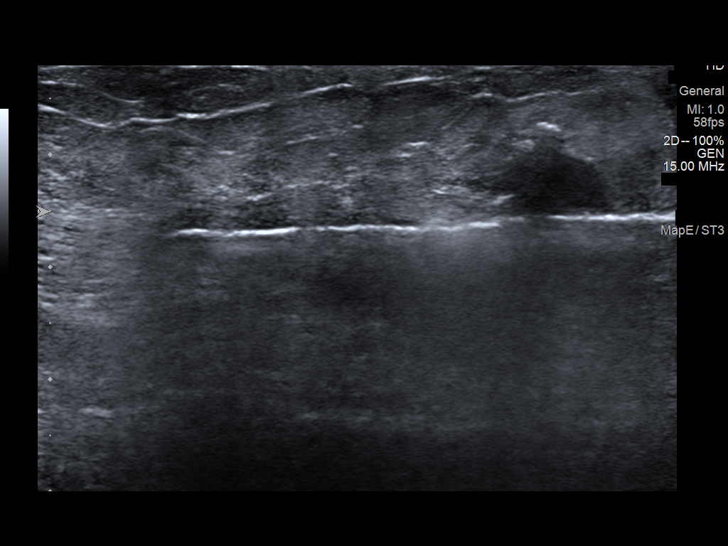
[im 10/12]
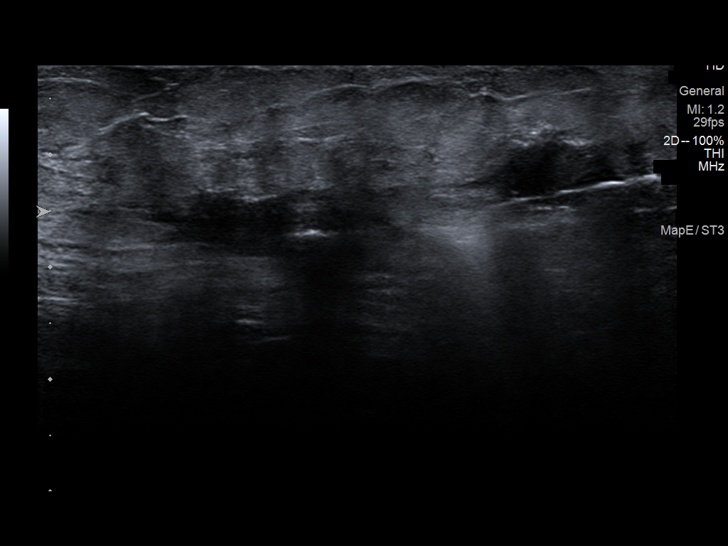
[im 11/12]
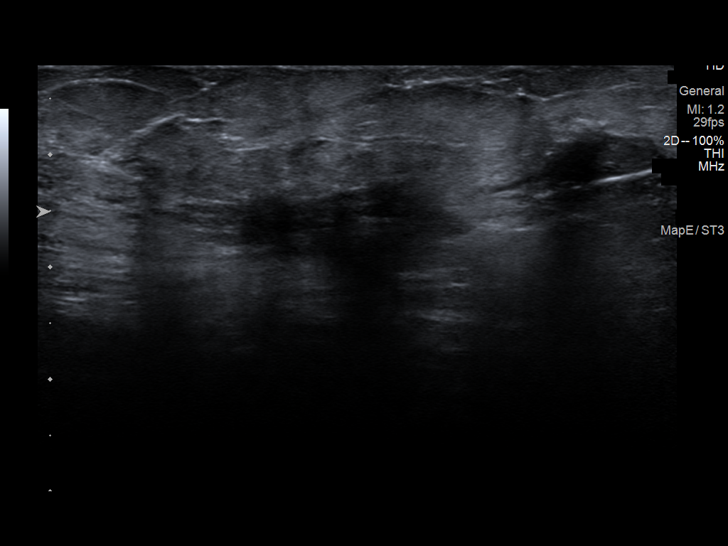
[im 12/12]
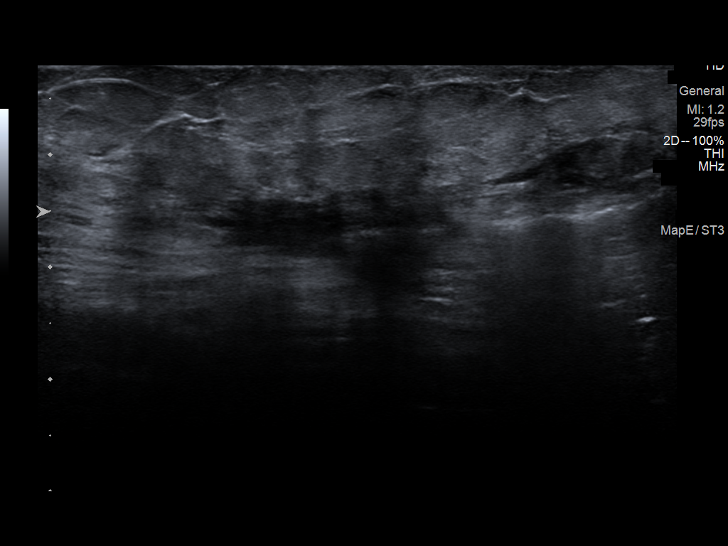

[12 of 12 positions shown; findings below may reference images not displayed]



Lesion quadrant: Upper outer quadrant

Using sterile technique and 1% Lidocaine as local anesthetic, under
direct ultrasound visualization, a 14 gauge YEI device was
used to perform biopsy of left breast 1 o'clock mass using a
inferior approach. At the conclusion of the procedure a spiral
shaped HydroMARK tissue marker clip was deployed into the biopsy
cavity. Follow up 2 view mammogram was performed and dictated
separately.
IMPRESSION: Ultrasound guided biopsy of left breast.  No apparent complications.

## 2018-07-26 ENCOUNTER — Ambulatory Visit: Payer: Self-pay | Admitting: Surgery

## 2018-07-26 DIAGNOSIS — R928 Other abnormal and inconclusive findings on diagnostic imaging of breast: Secondary | ICD-10-CM

## 2018-07-28 ENCOUNTER — Encounter (HOSPITAL_BASED_OUTPATIENT_CLINIC_OR_DEPARTMENT_OTHER): Payer: Self-pay | Admitting: *Deleted

## 2018-07-28 ENCOUNTER — Other Ambulatory Visit: Payer: Self-pay

## 2018-07-28 ENCOUNTER — Other Ambulatory Visit: Payer: Self-pay | Admitting: Surgery

## 2018-07-28 DIAGNOSIS — R928 Other abnormal and inconclusive findings on diagnostic imaging of breast: Secondary | ICD-10-CM

## 2018-07-28 NOTE — Pre-Procedure Instructions (Signed)
To go to [facility] for BMET and EKG prior to surgery - phone number given to call for appt.

## 2018-07-30 ENCOUNTER — Encounter (HOSPITAL_BASED_OUTPATIENT_CLINIC_OR_DEPARTMENT_OTHER): Payer: Self-pay | Admitting: Surgery

## 2018-07-30 DIAGNOSIS — R928 Other abnormal and inconclusive findings on diagnostic imaging of breast: Secondary | ICD-10-CM | POA: Diagnosis present

## 2018-07-30 NOTE — H&P (Signed)
General Surgery [facility]  [name] DOB: 31-Aug-1946 Widowed / Language: English / Race: Black or African American Female   History of Present Illness  The patient is a 72 year old female who presents with a complaint of Breast problems.  CHIEF COMPLAINT: sclerosing lesion left breast  Patient returns to my practice at the request of Dr. Dorian Pod for evaluation of abnormal left breast mammogram. Patient is known to my practice from previous thyroid surgery in August 2018. She has recovered nicely from that procedure. Patient was found to have an abnormal lesion on her mammogram. She has a history of right partial mastectomy several years ago. On her mammogram, there was a lesion in the upper outer portion of the left breast. Targeted ultrasound showed a 1.5 x 1.1 x 1.4 cm irregular hypoechoic mass 4 cm from the nipple. Ultrasound-guided core needle biopsy was performed and showed sclerosing fibrosis and fibroadipose tissue. This result was felt to be discordant by the radiologist and excisional biopsy was recommended. Patient presents today with her family to discuss surgery. Patient denies any palpable abnormalities. She denies any nipple discharge. She has had some mild discomfort following core needle biopsy.   Problem List/Past Medical TRACHEAL DEVIATION (J39.8)  RIGHT THYROID NODULE (E04.1)  MULTIPLE THYROID NODULES (E04.2)  SCLEROSING ADENOSIS OF BREAST, LEFT (N60.22)  ABNORMAL MAMMOGRAM OF LEFT BREAST (R92.8)   Past Surgical History Breast Biopsy  Right. Breast Mass; Local Excision  Right. Hysterectomy (due to cancer) - Complete  Oral Surgery   Diagnostic Studies History Colonoscopy  5-10 years ago Mammogram  1-3 years ago Pap Smear  >5 years ago  Allergies Levemir *ANTIDIABETICS*  Itching. AmLODIPine & Diet Manage Prod *CALCIUM CHANNEL BLOCKERS*  Allergies Reconciled  Levemir FlexPen *ANTIDIABETICS*   Itching.  Medication History Tylenol (325MG  Tablet, Oral) Active. Ranitidine (75MG  Tablet, Oral) Active. Myrbetriq (25MG  Tablet ER 24HR, Oral) Active. Mylanta Maximum Strength (400-400-40MG /5ML Suspension, Oral) Active. Losartan Potassium (100MG  Tablet, Oral) Active. Aspirin (81MG  Tablet DR, Oral) Active. AmLODIPine Besylate (5MG  Tablet, Oral) Active. Femara (2.5MG  Tablet, Oral) Active. Fosamax (70MG  Tablet, Oral) Active. Latanoprost (0.005% Emulsion, Ophthalmic) Active. Senna (Oral) Specific strength unknown - Active. Atorvastatin Calcium (40MG  Tablet, Oral) Active. Levothyroxine Sodium (112MCG Tablet, Oral) Active. Metoprolol Succinate ER (25MG  Tablet ER 24HR, Oral) Active. MetFORMIN HCl ER (500MG  Tablet ER 24HR, Oral) Active. Medications Reconciled Norvasc (5MG  Tablet, Oral) Active. Citalopram Hydrobromide (20MG  Tablet, Oral) Active. Simethicone (125MG  Tablet Chewable, Oral) Active. HydroCHLOROthiazide (25MG  Tablet, Oral) Active. Lumigan (0.01% Solution, Ophthalmic) Active. Meclizine HCl (12.5MG  Tablet, Oral) Active. MiraLax (Oral) Active. Pred Forte (1% Suspension, Ophthalmic) Active. Simvastatin (10MG  Tablet, Oral) Active. Vitamin D3 (2000UNIT Tablet, Oral) Active.  Social History Caffeine use  Coffee. No alcohol use  No drug use  Tobacco use  Never smoker.  Family History Colon Cancer  Family Members In General. Diabetes Mellitus  Brother. Hypertension  Brother.  Pregnancy / Birth History Age at menarche  45 years. Age of menopause  73-50 Contraceptive History  Oral contraceptives. Gravida  2 Irregular periods  Maternal age  8-35 Para  2  Other Problems Breast Cancer  Cancer  Cerebrovascular Accident  Diabetes Mellitus  Hypercholesterolemia  Pulmonary Embolism / Blood Clot in Legs   Vitals  Weight: 161 lb Height: 62in Body Surface Area: 1.74 m Body Mass Index: 29.45 kg/m  Temp.:  96.28F(Temporal)  Pulse: 97 (Regular)  BP: 172/110 (Sitting, Left Arm, Standard)  Physical Exam  See vital signs recorded above  GENERAL APPEARANCE  Development: normal Nutritional status: normal Gross deformities: History of stroke with left paraplegia Patient is seated in a wheelchair and is relatively immobile.  SKIN Rash, lesions, ulcers: none Induration, erythema: none Nodules: none palpable  EYES Conjunctiva and lids: normal Pupils: equal and reactive Iris: normal bilaterally  EARS, NOSE, MOUTH, THROAT External ears: no lesion or deformity External nose: no lesion or deformity Hearing: grossly normal Lips: no lesion or deformity Dentition: normal for age Oral mucosa: moist  NECK Symmetric: yes Trachea: midline Thyroid: no palpable nodules in the thyroid bed Well-healed anterior cervical incision  CHEST Respiratory effort: normal Retraction or accessory muscle use: no Breath sounds: normal bilaterally Rales, rhonchi, wheeze: none  CARDIOVASCULAR Auscultation: regular rhythm, normal rate Murmurs: none Pulses: carotid and radial pulse 2+ palpable Lower extremity edema: none Lower extremity varicosities: none  BREAST Breast exam is limited as the patient is seated in a wheelchair. Right breast shows normal nipple areolar complex. There is a well-healed surgical wound in the upper outer portion of the right breast consistent with previous lumpectomy. Left breast shows normal nipple areolar complex. Breast parenchyma is quite dense and firm. There is a Band-Aid in place from her recent biopsy in the lower portion of the left breast. There are no palpable masses.  LYMPHATIC Cervical: none palpable Supraclavicular: none palpable  PSYCHIATRIC Oriented to person, place, and time: yes Mood and affect: normal for situation Judgment and insight: appropriate for situation    Assessment & Plan  ABNORMAL MAMMOGRAM OF LEFT BREAST (R92.8)  Patient presents  on referral from her primary care physician for evaluation of an abnormal mammogram with a complex sclerosing lesion of the left breast. I have recommended wire localized excisional biopsy for definitive diagnosis.  I discussed the procedure at length with the patient and her family. We would perform this as an outpatient surgery under general anesthesia at the [facility]. Patient will require wire localization prior to the procedure at the breast center of [facility]. We will make arrangements for this procedure in the near future at a time convenient for the patient.  The risks and benefits of the procedure have been discussed at length with the patient. The patient understands the proposed procedure, potential alternative treatments, and the course of recovery to be expected. All of the patient's questions have been answered at this time. The patient wishes to proceed with surgery.  Armandina Gemma, [place] [facility] Office: [phone]

## 2018-08-01 ENCOUNTER — Encounter (HOSPITAL_COMMUNITY)
Admission: RE | Admit: 2018-08-01 | Discharge: 2018-08-01 | Disposition: A | Discharge status: Home / Self Care | Payer: Medicare (Managed Care) | Source: Ambulatory Visit | Attending: Surgery | Admitting: Surgery

## 2018-08-01 DIAGNOSIS — K219 Gastro-esophageal reflux disease without esophagitis: Secondary | ICD-10-CM | POA: Diagnosis not present

## 2018-08-01 DIAGNOSIS — F329 Major depressive disorder, single episode, unspecified: Secondary | ICD-10-CM | POA: Diagnosis not present

## 2018-08-01 DIAGNOSIS — Z7984 Long term (current) use of oral hypoglycemic drugs: Secondary | ICD-10-CM | POA: Diagnosis not present

## 2018-08-01 DIAGNOSIS — I10 Essential (primary) hypertension: Secondary | ICD-10-CM | POA: Diagnosis not present

## 2018-08-01 DIAGNOSIS — Z8673 Personal history of transient ischemic attack (TIA), and cerebral infarction without residual deficits: Secondary | ICD-10-CM | POA: Diagnosis not present

## 2018-08-01 DIAGNOSIS — Z7982 Long term (current) use of aspirin: Secondary | ICD-10-CM | POA: Diagnosis not present

## 2018-08-01 DIAGNOSIS — N6022 Fibroadenosis of left breast: Secondary | ICD-10-CM | POA: Diagnosis not present

## 2018-08-01 DIAGNOSIS — Z79899 Other long term (current) drug therapy: Secondary | ICD-10-CM | POA: Diagnosis not present

## 2018-08-01 DIAGNOSIS — E119 Type 2 diabetes mellitus without complications: Secondary | ICD-10-CM | POA: Diagnosis not present

## 2018-08-01 DIAGNOSIS — N6092 Unspecified benign mammary dysplasia of left breast: Secondary | ICD-10-CM | POA: Diagnosis not present

## 2018-08-01 LAB — BASIC METABOLIC PANEL
Anion gap: 8 (ref 5–15)
BUN: 26 mg/dL — ABNORMAL HIGH (ref 8–23)
CALCIUM: 9.1 mg/dL (ref 8.9–10.3)
CO2: 25 mmol/L (ref 22–32)
CREATININE: 1.66 mg/dL — AB (ref 0.44–1.00)
Chloride: 107 mmol/L (ref 98–111)
GFR calc Af Amer: 34 mL/min — ABNORMAL LOW (ref >60)
GFR calc non Af Amer: 29 mL/min — ABNORMAL LOW (ref >60)
GLUCOSE: 224 mg/dL — AB (ref 70–99)
Potassium: 3.7 mmol/L (ref 3.5–5.1)
Sodium: 140 mmol/L (ref 135–145)

## 2018-08-02 ENCOUNTER — Other Ambulatory Visit (HOSPITAL_COMMUNITY): Payer: Medicare (Managed Care)

## 2018-08-03 ENCOUNTER — Ambulatory Visit
Admission: RE | Admit: 2018-08-03 | Discharge: 2018-08-03 | Disposition: A | Discharge status: Home / Self Care | Payer: Medicare (Managed Care) | Source: Ambulatory Visit | Attending: Surgery | Admitting: Surgery

## 2018-08-03 ENCOUNTER — Ambulatory Visit (HOSPITAL_BASED_OUTPATIENT_CLINIC_OR_DEPARTMENT_OTHER): Payer: Medicare (Managed Care) | Admitting: Anesthesiology

## 2018-08-03 ENCOUNTER — Encounter (HOSPITAL_BASED_OUTPATIENT_CLINIC_OR_DEPARTMENT_OTHER): Payer: Self-pay | Admitting: *Deleted

## 2018-08-03 ENCOUNTER — Other Ambulatory Visit: Payer: Self-pay

## 2018-08-03 ENCOUNTER — Encounter (HOSPITAL_BASED_OUTPATIENT_CLINIC_OR_DEPARTMENT_OTHER)
Admission: RE | Disposition: A | Discharge status: Home / Self Care | Payer: Self-pay | Source: Ambulatory Visit | Attending: Surgery

## 2018-08-03 ENCOUNTER — Ambulatory Visit (HOSPITAL_BASED_OUTPATIENT_CLINIC_OR_DEPARTMENT_OTHER)
Admission: RE | Admit: 2018-08-03 | Discharge: 2018-08-03 | Disposition: A | Discharge status: Home / Self Care | Payer: Medicare (Managed Care) | Source: Ambulatory Visit | Attending: Surgery | Admitting: Surgery

## 2018-08-03 DIAGNOSIS — R928 Other abnormal and inconclusive findings on diagnostic imaging of breast: Secondary | ICD-10-CM

## 2018-08-03 DIAGNOSIS — Z8673 Personal history of transient ischemic attack (TIA), and cerebral infarction without residual deficits: Secondary | ICD-10-CM | POA: Insufficient documentation

## 2018-08-03 DIAGNOSIS — N6022 Fibroadenosis of left breast: Secondary | ICD-10-CM | POA: Insufficient documentation

## 2018-08-03 DIAGNOSIS — I10 Essential (primary) hypertension: Secondary | ICD-10-CM | POA: Insufficient documentation

## 2018-08-03 DIAGNOSIS — E119 Type 2 diabetes mellitus without complications: Secondary | ICD-10-CM | POA: Insufficient documentation

## 2018-08-03 DIAGNOSIS — N6092 Unspecified benign mammary dysplasia of left breast: Secondary | ICD-10-CM | POA: Insufficient documentation

## 2018-08-03 DIAGNOSIS — Z79899 Other long term (current) drug therapy: Secondary | ICD-10-CM | POA: Insufficient documentation

## 2018-08-03 DIAGNOSIS — Z7982 Long term (current) use of aspirin: Secondary | ICD-10-CM | POA: Insufficient documentation

## 2018-08-03 DIAGNOSIS — F329 Major depressive disorder, single episode, unspecified: Secondary | ICD-10-CM | POA: Insufficient documentation

## 2018-08-03 DIAGNOSIS — Z7984 Long term (current) use of oral hypoglycemic drugs: Secondary | ICD-10-CM | POA: Insufficient documentation

## 2018-08-03 DIAGNOSIS — K219 Gastro-esophageal reflux disease without esophagitis: Secondary | ICD-10-CM | POA: Insufficient documentation

## 2018-08-03 HISTORY — DX: Type 2 diabetes mellitus without complications: E11.9

## 2018-08-03 HISTORY — DX: Personal history of other specified conditions: Z87.898

## 2018-08-03 HISTORY — DX: Overactive bladder: N32.81

## 2018-08-03 HISTORY — DX: Hypothyroidism, unspecified: E03.9

## 2018-08-03 HISTORY — DX: Hemiplegia, unspecified affecting unspecified side: G81.90

## 2018-08-03 HISTORY — DX: Unspecified sequelae of cerebral infarction: I69.30

## 2018-08-03 HISTORY — PX: BREAST BIOPSY: SHX20

## 2018-08-03 LAB — GLUCOSE, CAPILLARY
GLUCOSE-CAPILLARY: 136 mg/dL — AB (ref 70–99)
Glucose-Capillary: 124 mg/dL — ABNORMAL HIGH (ref 70–99)

## 2018-08-03 IMAGING — MG MM PLC BREAST LOC DEV 1ST LESION INC MAMMO GUIDE*L*
3 series · 3 of 3 positions shown · non-contrast
Comparison: Previous exams.

CLINICAL DATA: Needle localization

EXAM:
NEEDLE LOCALIZATION OF THE LEFT BREAST WITH MAMMO GUIDANCE

[L LM]
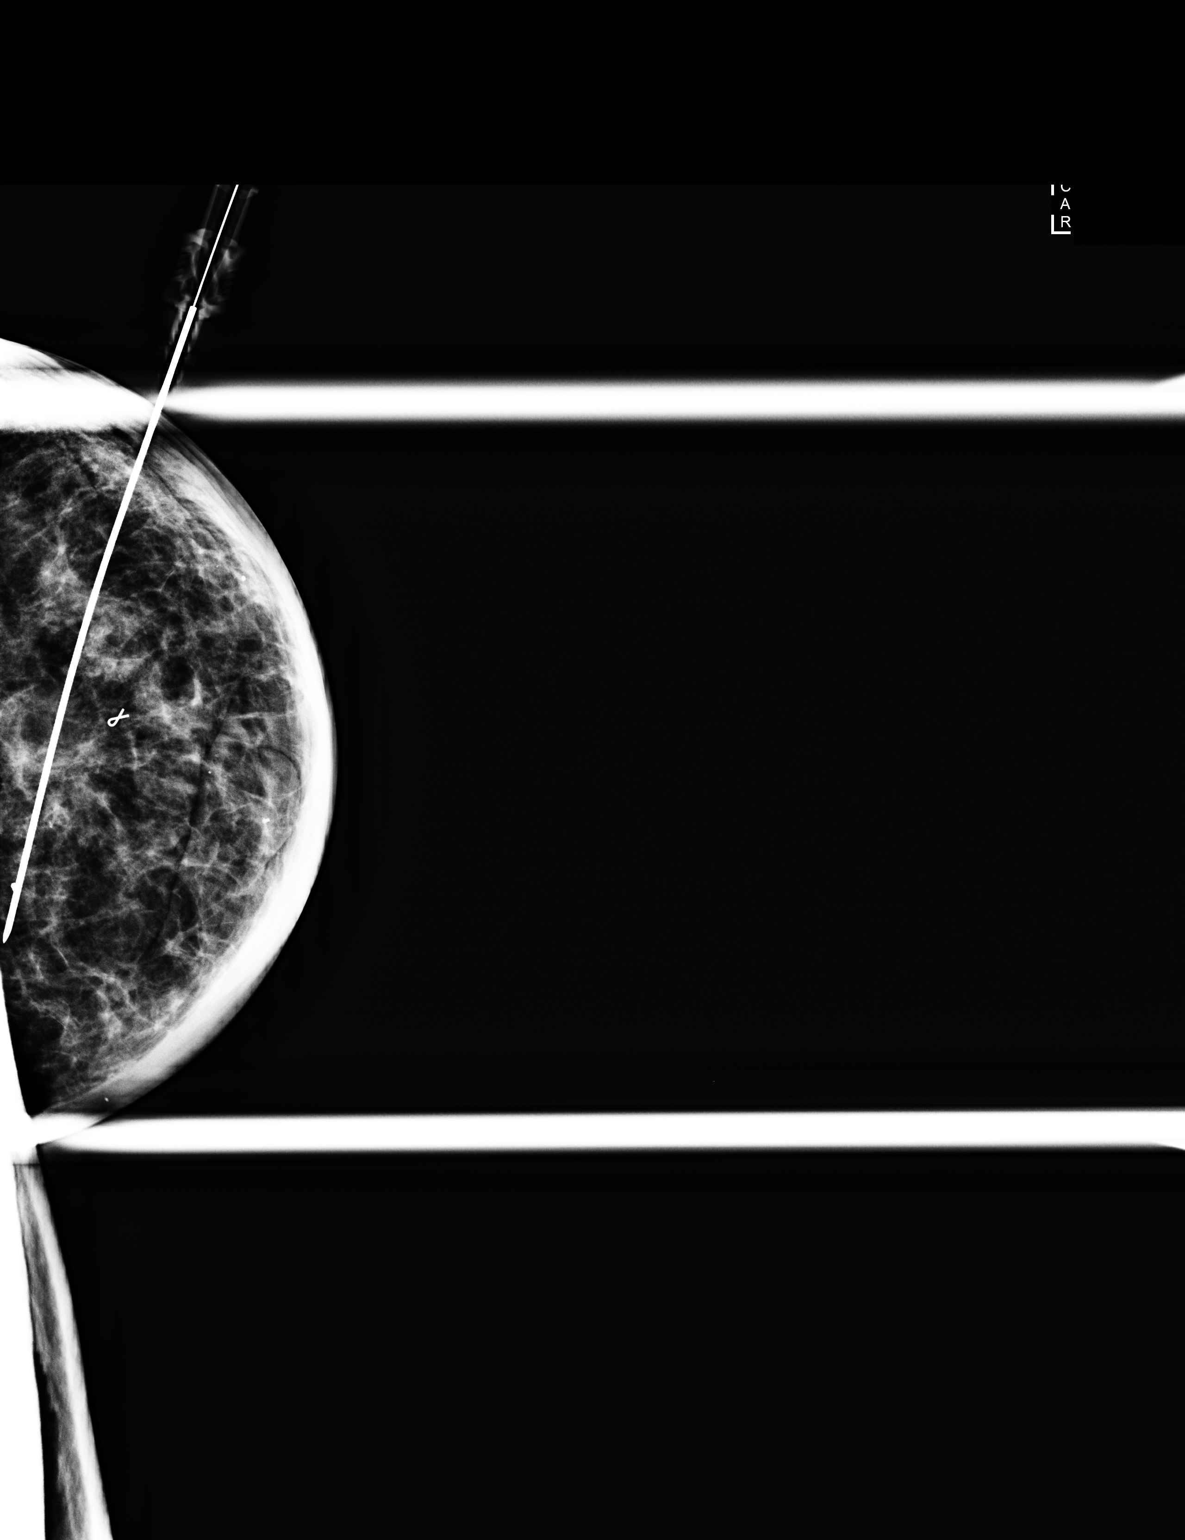

[L CC (1 of 2)]
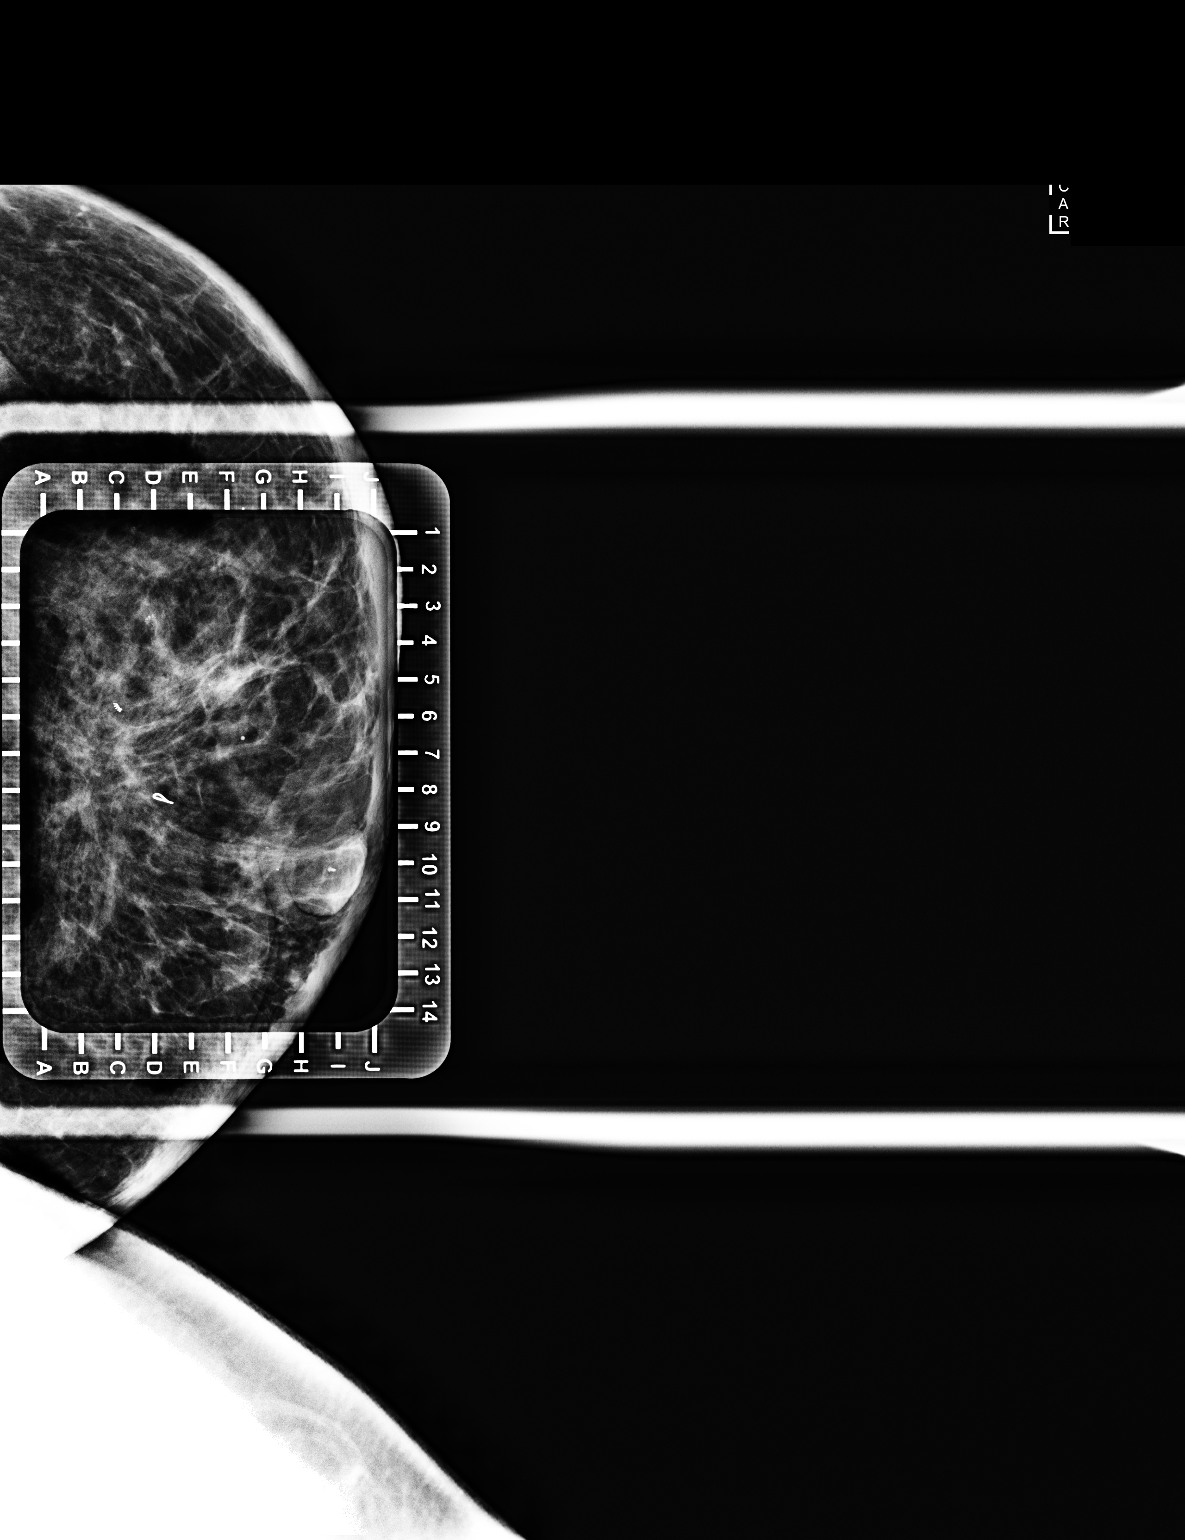

[L CC (2 of 2)]
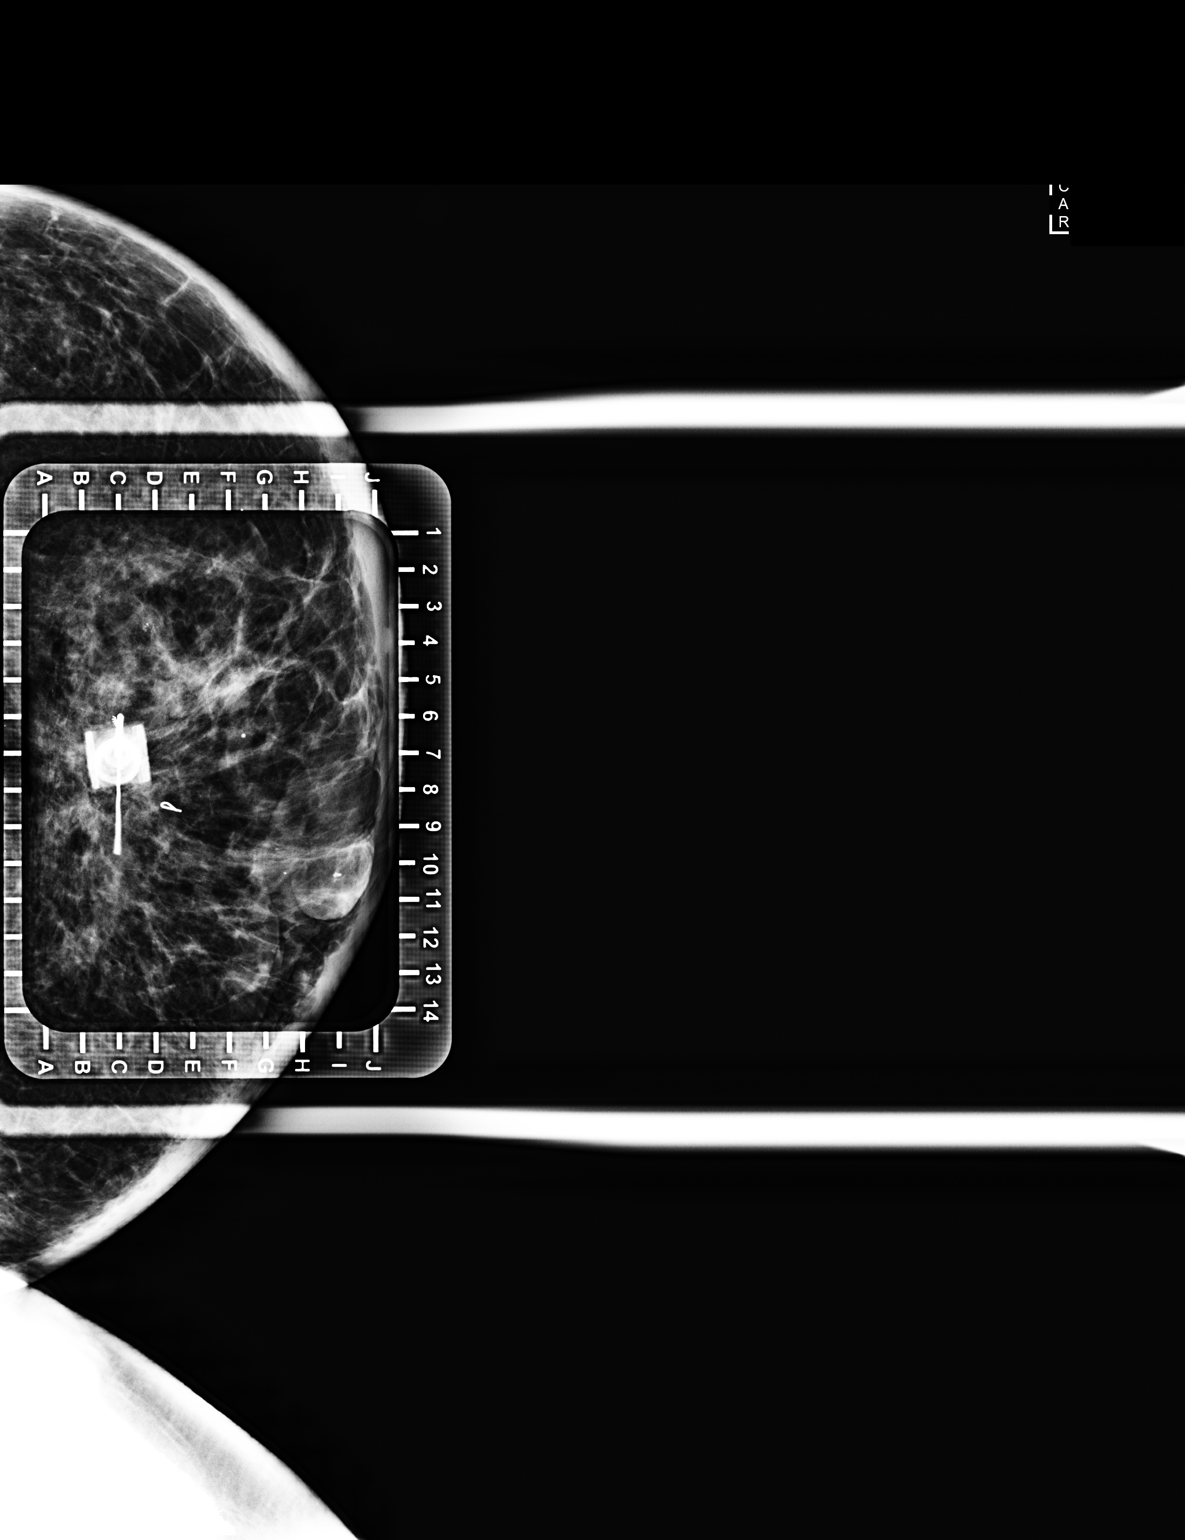

[3 of 3 positions shown; findings below may reference images not displayed]

FINDINGS: Patient presents for needle localization prior to surgery. I met
with the patient and we discussed the procedure of needle
localization including benefits and alternatives. We discussed the
high likelihood of a successful procedure. We discussed the risks of
the procedure, including infection, bleeding, tissue injury, and
further surgery. Informed, written consent was given. The usual
time-out protocol was performed immediately prior to the procedure.

Using mammographic guidance, sterile technique, 1% lidocaine and a 9
cm modified Kopans needle, the biopsy clip and adjacent
asymmetry/distortion were localized using superior approach. The
images were marked for the surgeon.
IMPRESSION: Needle localization left breast. No apparent complications.

## 2018-08-03 IMAGING — MG BREAST SURGICAL SPECIMEN
1 series · 1 of 1 positions shown · non-contrast
Comparison: [DATE] and earlier

CLINICAL DATA: Status post wire localization of LEFT breast lesion
following discordant biopsy.

EXAM:
SPECIMEN RADIOGRAPH OF THE LEFT BREAST

[L]
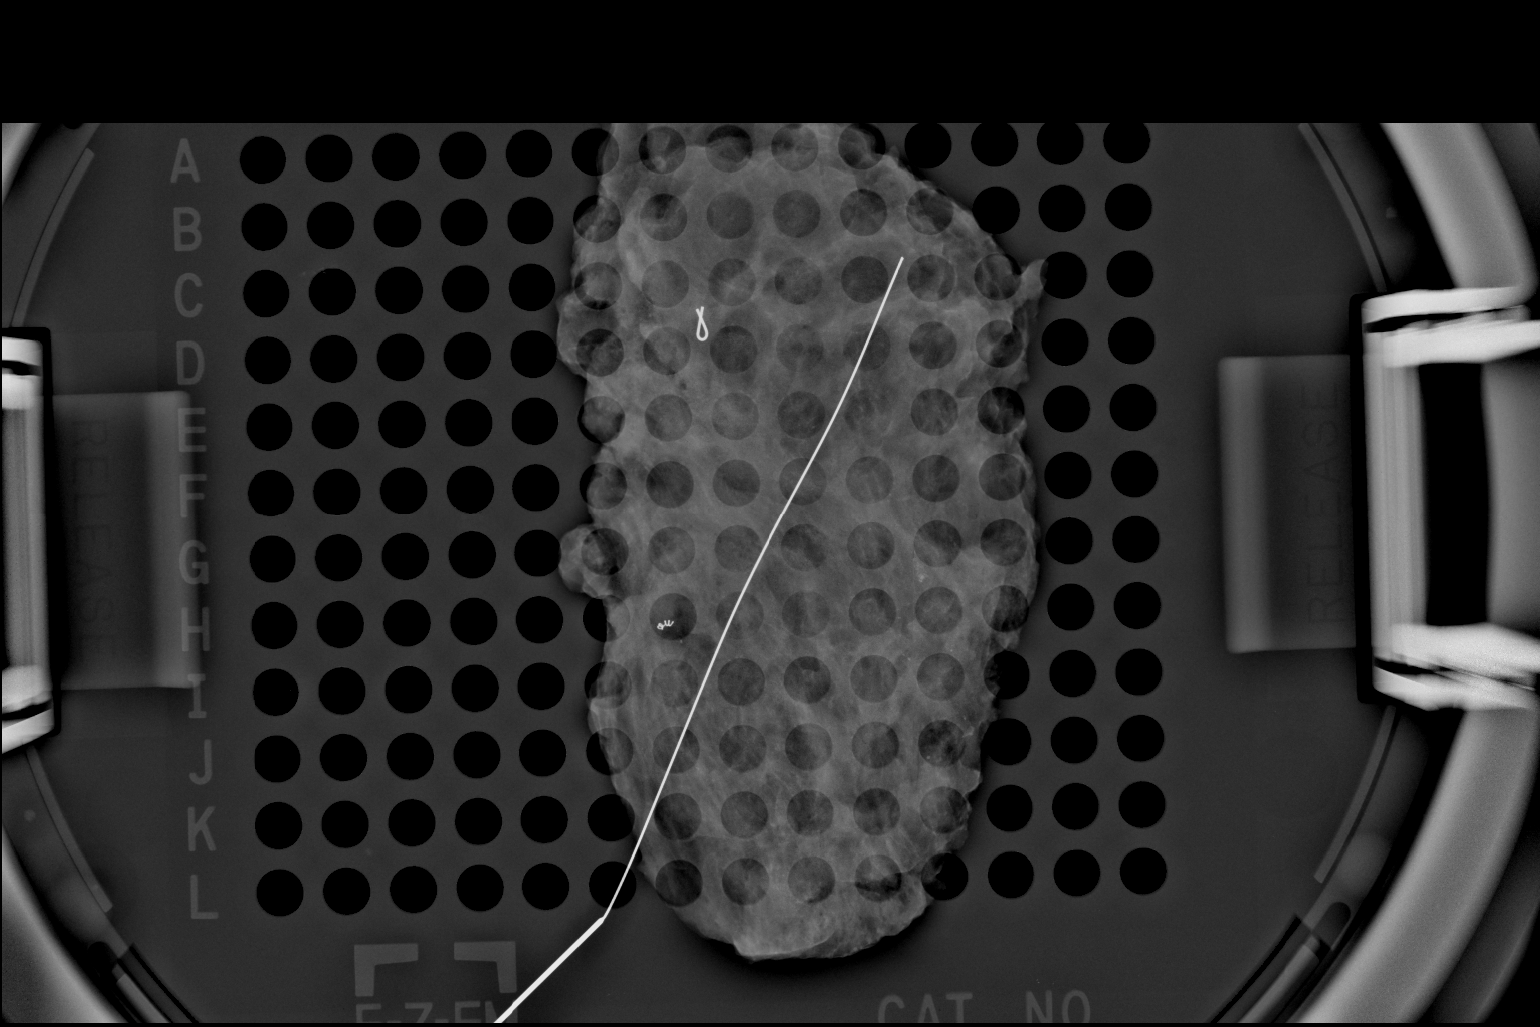

[1 of 1 positions shown; findings below may reference images not displayed]

FINDINGS: Status post excision of the left breast. The wire tip, ribbon shaped
clip, and coil shaped clip are present and are marked for pathology.
IMPRESSION: Specimen radiograph of the left breast.

## 2018-08-03 SURGERY — BREAST BIOPSY WITH NEEDLE LOCALIZATION
Anesthesia: General | Site: Breast | Laterality: Left

## 2018-08-03 MED ORDER — MIDAZOLAM HCL 2 MG/2ML IJ SOLN: 1.0000 mg | INTRAMUSCULAR | Status: DC | PRN

## 2018-08-03 MED ORDER — FENTANYL CITRATE (PF) 100 MCG/2ML IJ SOLN
INTRAMUSCULAR | Status: AC
  Filled 2018-08-03: qty 2

## 2018-08-03 MED ORDER — FENTANYL CITRATE (PF) 100 MCG/2ML IJ SOLN
50.0000 ug | INTRAMUSCULAR | Status: AC | PRN
  Administered 2018-08-03: 25 ug via INTRAVENOUS
  Administered 2018-08-03: 50 ug via INTRAVENOUS
  Administered 2018-08-03: 25 ug via INTRAVENOUS

## 2018-08-03 MED ORDER — DEXAMETHASONE SODIUM PHOSPHATE 10 MG/ML IJ SOLN
INTRAMUSCULAR | Status: AC
  Filled 2018-08-03: qty 1

## 2018-08-03 MED ORDER — CHLORHEXIDINE GLUCONATE CLOTH 2 % EX PADS: 6.0000 | MEDICATED_PAD | Freq: Once | CUTANEOUS | Status: DC

## 2018-08-03 MED ORDER — DEXAMETHASONE SODIUM PHOSPHATE 4 MG/ML IJ SOLN
INTRAMUSCULAR | Status: DC | PRN
  Administered 2018-08-03: 5 mg via INTRAVENOUS

## 2018-08-03 MED ORDER — BUPIVACAINE HCL (PF) 0.5 % IJ SOLN
INTRAMUSCULAR | Status: AC
  Filled 2018-08-03: qty 30

## 2018-08-03 MED ORDER — LIDOCAINE HCL (CARDIAC) PF 100 MG/5ML IV SOSY
PREFILLED_SYRINGE | INTRAVENOUS | Status: DC | PRN
  Administered 2018-08-03: 80 mg via INTRAVENOUS

## 2018-08-03 MED ORDER — CEFAZOLIN SODIUM-DEXTROSE 2-4 GM/100ML-% IV SOLN
INTRAVENOUS | Status: AC
  Filled 2018-08-03: qty 100

## 2018-08-03 MED ORDER — SCOPOLAMINE 1 MG/3DAYS TD PT72: 1.0000 | MEDICATED_PATCH | Freq: Once | TRANSDERMAL | Status: DC | PRN

## 2018-08-03 MED ORDER — LIDOCAINE 2% (20 MG/ML) 5 ML SYRINGE
INTRAMUSCULAR | Status: AC
  Filled 2018-08-03: qty 5

## 2018-08-03 MED ORDER — ONDANSETRON HCL 4 MG/2ML IJ SOLN
INTRAMUSCULAR | Status: AC
  Filled 2018-08-03: qty 2

## 2018-08-03 MED ORDER — ONDANSETRON HCL 4 MG/2ML IJ SOLN: 4.0000 mg | Freq: Once | INTRAMUSCULAR | Status: DC | PRN

## 2018-08-03 MED ORDER — CEFAZOLIN SODIUM-DEXTROSE 2-4 GM/100ML-% IV SOLN: 2.0000 g | INTRAVENOUS | Status: DC

## 2018-08-03 MED ORDER — PROPOFOL 10 MG/ML IV BOLUS
INTRAVENOUS | Status: AC
  Filled 2018-08-03: qty 20

## 2018-08-03 MED ORDER — LACTATED RINGERS IV SOLN
INTRAVENOUS | Status: DC
  Administered 2018-08-03: 13:00:00 via INTRAVENOUS

## 2018-08-03 MED ORDER — BUPIVACAINE HCL (PF) 0.5 % IJ SOLN
INTRAMUSCULAR | Status: DC | PRN
  Administered 2018-08-03: 10 mL

## 2018-08-03 MED ORDER — TRAMADOL HCL 50 MG PO TABS: 50.0000 mg | ORAL_TABLET | Freq: Four times a day (QID) | ORAL | 0 refills | Status: DC | PRN

## 2018-08-03 MED ORDER — FENTANYL CITRATE (PF) 100 MCG/2ML IJ SOLN: 25.0000 ug | INTRAMUSCULAR | Status: DC | PRN

## 2018-08-03 MED ORDER — PROPOFOL 10 MG/ML IV BOLUS
INTRAVENOUS | Status: DC | PRN
  Administered 2018-08-03: 100 mg via INTRAVENOUS

## 2018-08-03 SURGICAL SUPPLY — 43 items
APL SKNCLS STERI-STRIP NONHPOA (GAUZE/BANDAGES/DRESSINGS) ×1
BENZOIN TINCTURE PRP APPL 2/3 (GAUZE/BANDAGES/DRESSINGS) ×3 IMPLANT
BLADE HEX COATED 2.75 (ELECTRODE) ×3 IMPLANT
BLADE SURG 15 STRL LF DISP TIS (BLADE) ×1 IMPLANT
BLADE SURG 15 STRL SS (BLADE) ×3
CANISTER SUCT 1200ML W/VALVE (MISCELLANEOUS) IMPLANT
CHLORAPREP W/TINT 26ML (MISCELLANEOUS) ×3 IMPLANT
CLIP VESOCCLUDE SM WIDE 6/CT (CLIP) ×2 IMPLANT
CLOSURE WOUND 1/2 X4 (GAUZE/BANDAGES/DRESSINGS) ×1
COVER BACK TABLE 60X90IN (DRAPES) ×3 IMPLANT
COVER MAYO STAND STRL (DRAPES) ×3 IMPLANT
DECANTER SPIKE VIAL GLASS SM (MISCELLANEOUS) IMPLANT
DEVICE DUBIN W/COMP PLATE 8390 (MISCELLANEOUS) ×2 IMPLANT
DRAPE LAPAROTOMY 100X72 PEDS (DRAPES) ×3 IMPLANT
DRAPE UTILITY XL STRL (DRAPES) ×3 IMPLANT
ELECT REM PT RETURN 9FT ADLT (ELECTROSURGICAL) ×3
ELECTRODE REM PT RTRN 9FT ADLT (ELECTROSURGICAL) ×1 IMPLANT
GLOVE BIO SURGEON STRL SZ7 (GLOVE) ×2 IMPLANT
GLOVE EXAM NITRILE MD LF STRL (GLOVE) ×2 IMPLANT
GLOVE SURG ORTHO 8.0 STRL STRW (GLOVE) ×3 IMPLANT
GOWN STRL REUS W/ TWL LRG LVL3 (GOWN DISPOSABLE) ×1 IMPLANT
GOWN STRL REUS W/ TWL XL LVL3 (GOWN DISPOSABLE) ×1 IMPLANT
GOWN STRL REUS W/TWL LRG LVL3 (GOWN DISPOSABLE)
GOWN STRL REUS W/TWL XL LVL3 (GOWN DISPOSABLE) ×6
KIT MARKER MARGIN INK (KITS) ×2 IMPLANT
NDL HYPO 25X1 1.5 SAFETY (NEEDLE) ×1 IMPLANT
NEEDLE HYPO 25X1 1.5 SAFETY (NEEDLE) ×3 IMPLANT
NS IRRIG 1000ML POUR BTL (IV SOLUTION) ×3 IMPLANT
PACK BASIN DAY SURGERY FS (CUSTOM PROCEDURE TRAY) ×3 IMPLANT
PENCIL BUTTON HOLSTER BLD 10FT (ELECTRODE) ×3 IMPLANT
SLEEVE SCD COMPRESS KNEE MED (MISCELLANEOUS) ×2 IMPLANT
SPONGE LAP 4X18 RFD (DISPOSABLE) ×3 IMPLANT
STRIP CLOSURE SKIN 1/2X4 (GAUZE/BANDAGES/DRESSINGS) ×2 IMPLANT
SUT MNCRL AB 4-0 PS2 18 (SUTURE) ×4 IMPLANT
SUT VIC AB 3-0 SH 27 (SUTURE) ×3
SUT VIC AB 3-0 SH 27X BRD (SUTURE) ×1 IMPLANT
SUT VICRYL 4-0 PS2 18IN ABS (SUTURE) IMPLANT
SYR CONTROL 10ML LL (SYRINGE) ×3 IMPLANT
TOWEL GREEN STERILE FF (TOWEL DISPOSABLE) ×4 IMPLANT
TOWEL OR NON WOVEN STRL DISP B (DISPOSABLE) ×1 IMPLANT
TUBE CONNECTING 20'X1/4 (TUBING)
TUBE CONNECTING 20X1/4 (TUBING) IMPLANT
YANKAUER SUCT BULB TIP NO VENT (SUCTIONS) IMPLANT

## 2018-08-03 NOTE — Op Note (Signed)
Operative Note  Pre-operative Diagnosis:  Abnormal left breast mammogram, complex sclerosing lesion  Post-operative Diagnosis:  same  Surgeon:  Armandina Gemma, MD  Assistant:  none   Procedure:  Left breast wire-localized excisional biopsy  Anesthesia:  general  Estimated Blood Loss:  minimal  Drains: none         Specimen: to pathology  Indications:  Patient returns to my practice at the request of Dr. Dorian Pod for evaluation of abnormal left breast mammogram. Patient is known to my practice from previous thyroid surgery in August 2018. She has recovered nicely from that procedure. Patient was found to have an abnormal lesion on her mammogram. She has a history of right partial mastectomy several years ago. On her mammogram, there was a lesion in the upper outer portion of the left breast. Targeted ultrasound showed a 1.5 x 1.1 x 1.4 cm irregular hypoechoic mass 4 cm from the nipple. Ultrasound-guided core needle biopsy was performed and showed sclerosing fibrosis and fibroadipose tissue. This result was felt to be discordant by the radiologist and excisional biopsy was recommended.   Procedure Details:  The patient was seen in the pre-op holding area. The risks, benefits, complications, treatment options, and expected outcomes were previously discussed with the patient. The patient agreed with the proposed plan and has signed the informed consent form.  The patient was brought to the operating room by the surgical team, identified as [name] and the procedure verified. A "time out" was completed and the above information confirmed.  Following administration of general anesthesia, the patient is positioned and then prepped and draped in usual aseptic fashion.  A curvilinear incision is made at the base of the wire.  Skin flaps are elevated circumferentially with the electrocautery.  Dissection was carried into the breast using electrocautery for hemostasis.  A core of  breast tissue was excised around the guidewire down to the tip of the guidewire.  The specimen was completely excised.  Dry pack was placed in the wound.  Specimen was oriented using the marking paint.  Specimen mammography was performed which confirmed that all of the radiologic markers were present within the specimen.  Specimen was prepared and submitted to pathology.  Packs were removed from the wound in the left breast.  Good hemostasis was achieved with the electrocautery.  Margins of dissection were marked with small ligaclips.  Subcutaneous tissues were closed with interrupted 3-0 Vicryl sutures.  Skin was anesthetized with local anesthetic.  Skin edges were reapproximated with a running 4-0 Monocryl subcuticular suture.  Wound was washed and dried and Steri-Strips were applied.  Sterile dressings were applied.  Patient was awakened from anesthesia and brought to the recovery room.  The patient tolerated the procedure well.   Armandina Gemma, MD [facility] Office: [phone]

## 2018-08-03 NOTE — Discharge Instructions (Signed)

## 2018-08-03 NOTE — Anesthesia Procedure Notes (Signed)
Procedure Name: LMA Insertion Date/Time: 08/03/2018 1:13 PM Performed by: Maryella Shivers, CRNA Pre-anesthesia Checklist: Patient identified, Emergency Drugs available, Suction available and Patient being monitored Patient Re-evaluated:Patient Re-evaluated prior to induction Oxygen Delivery Method: Circle system utilized Preoxygenation: Pre-oxygenation with 100% oxygen Induction Type: IV induction Ventilation: Mask ventilation without difficulty LMA: LMA inserted LMA Size: 4.0 Number of attempts: 1 Airway Equipment and Method: Bite block Placement Confirmation: positive ETCO2 Tube secured with: Tape Dental Injury: Teeth and Oropharynx as per pre-operative assessment

## 2018-08-03 NOTE — Transfer of Care (Signed)
Immediate Anesthesia Transfer of Care Note  Patient: Kirsten Mcmahon December  Procedure(s) Performed: LEFT BREAST BIOPSY WITH NEEDLE LOCALIZATION (Left Breast)  Patient Location: PACU  Anesthesia Type:General  Level of Consciousness: sedated  Airway & Oxygen Therapy: Patient Spontanous Breathing and Patient connected to face mask oxygen  Post-op Assessment: Report given to RN and Post -op Vital signs reviewed and stable  Post vital signs: Reviewed and stable  Last Vitals:  Vitals Value Taken Time  BP 132/85 08/03/2018  2:08 PM  Temp    Pulse 95 08/03/2018  2:10 PM  Resp 19 08/03/2018  2:10 PM  SpO2 100 % 08/03/2018  2:10 PM  Vitals shown include unvalidated device data.  Last Pain:  Vitals:   08/03/18 1242  TempSrc: Oral  PainSc:          Complications: No apparent anesthesia complications

## 2018-08-03 NOTE — Anesthesia Preprocedure Evaluation (Addendum)
Anesthesia Evaluation  Patient identified by MRN, date of birth, ID band Patient awake    Reviewed: Allergy & Precautions, NPO status , Patient's Chart, lab work & pertinent test results  Airway Mallampati: III  TM Distance: >3 FB Neck ROM: Full    Dental  (+) Dental Advisory Given, Poor Dentition, Chipped, Missing   Pulmonary neg pulmonary ROS,    Pulmonary exam normal breath sounds clear to auscultation       Cardiovascular hypertension, Pt. on medications Normal cardiovascular exam Rhythm:Regular Rate:Normal     Neuro/Psych Seizures - (x1),  PSYCHIATRIC DISORDERS Depression CVA (L sided weakness), Residual Symptoms    GI/Hepatic Neg liver ROS, GERD  ,  Endo/Other  diabetes, Type 2, Oral Hypoglycemic AgentsHypothyroidism   Renal/GU Renal InsufficiencyRenal disease Bladder dysfunction      Musculoskeletal negative musculoskeletal ROS (+)   Abdominal   Peds  Hematology negative hematology ROS (+)   Anesthesia Other Findings Day of surgery medications reviewed with the patient.  sclerosing lesion of left breast  Reproductive/Obstetrics                            Anesthesia Physical Anesthesia Plan  ASA: III  Anesthesia Plan: General   Post-op Pain Management:    Induction: Intravenous  PONV Risk Score and Plan: 3 and Ondansetron and Dexamethasone  Airway Management Planned: LMA  Additional Equipment:   Intra-op Plan:   Post-operative Plan: Extubation in OR  Informed Consent: I have reviewed the patients History and Physical, chart, labs and discussed the procedure including the risks, benefits and alternatives for the proposed anesthesia with the patient or authorized representative who has indicated his/her understanding and acceptance.   Dental advisory given  Plan Discussed with: CRNA  Anesthesia Plan Comments:         Anesthesia Quick Evaluation

## 2018-08-03 NOTE — Interval H&P Note (Signed)
History and Physical Interval Note:  08/03/2018 12:54 PM  Kirsten Mcmahon  has presented today for surgery, with the diagnosis of sclerosing lesion of left breast  The various methods of treatment have been discussed with the patient and family. After consideration of risks, benefits and other options for treatment, the patient has consented to    Procedure(s): LEFT BREAST BIOPSY WITH NEEDLE LOCALIZATION (Left) as a surgical intervention .    The patient's history has been reviewed, patient examined, no change in status, stable for surgery.  I have reviewed the patient's chart and labs.  Questions were answered to the patient's satisfaction.    Armandina Gemma, [place] [facility] Office: [place]

## 2018-08-04 ENCOUNTER — Encounter (HOSPITAL_BASED_OUTPATIENT_CLINIC_OR_DEPARTMENT_OTHER): Payer: Self-pay | Admitting: Surgery

## 2018-08-04 NOTE — Progress Notes (Signed)
Please contact patient and notify of benign pathology results.  Kirsten Mcmahon [name], MD, FACS [facility] Office: [phone]   

## 2018-08-04 NOTE — Anesthesia Postprocedure Evaluation (Signed)
Anesthesia Post Note  Patient: Kirsten Mcmahon  Procedure(s) Performed: LEFT BREAST BIOPSY WITH NEEDLE LOCALIZATION (Left Breast)     Patient location during evaluation: PACU Anesthesia Type: General Level of consciousness: awake and alert, awake and oriented Pain management: pain level controlled Vital Signs Assessment: post-procedure vital signs reviewed and stable Respiratory status: spontaneous breathing, nonlabored ventilation and respiratory function stable Cardiovascular status: blood pressure returned to baseline and stable Postop Assessment: no apparent nausea or vomiting Anesthetic complications: no    Last Vitals:  Vitals:   08/03/18 1437 08/03/18 1504  BP:  (!) 141/90  Pulse: 82 78  Resp: (!) 21 (!) 22  Temp:  (!) 36.4 C  SpO2: 100% 98%    Last Pain:  Vitals:   08/03/18 1504  TempSrc:   PainSc: 0-No pain                 Catalina Gravel

## 2018-09-07 ENCOUNTER — Ambulatory Visit
Admission: EM | Admit: 2018-09-07 | Discharge: 2018-09-07 | Disposition: A | Discharge status: Home / Self Care | Payer: Medicare HMO | Attending: Family Medicine | Admitting: Family Medicine

## 2018-09-07 ENCOUNTER — Other Ambulatory Visit: Payer: Self-pay

## 2018-09-07 ENCOUNTER — Encounter: Payer: Self-pay | Admitting: Emergency Medicine

## 2018-09-07 DIAGNOSIS — Z760 Encounter for issue of repeat prescription: Secondary | ICD-10-CM | POA: Diagnosis not present

## 2018-09-07 DIAGNOSIS — R42 Dizziness and giddiness: Secondary | ICD-10-CM | POA: Diagnosis not present

## 2018-09-07 DIAGNOSIS — E119 Type 2 diabetes mellitus without complications: Secondary | ICD-10-CM | POA: Diagnosis not present

## 2018-09-07 MED ORDER — MIRABEGRON ER 25 MG PO TB24: 25.0000 mg | ORAL_TABLET | Freq: Every day | ORAL | 0 refills | Status: DC

## 2018-09-07 MED ORDER — LETROZOLE 2.5 MG PO TABS: 2.5000 mg | ORAL_TABLET | Freq: Every day | ORAL | 0 refills | Status: DC

## 2018-09-07 MED ORDER — LOSARTAN POTASSIUM 100 MG PO TABS: 100.0000 mg | ORAL_TABLET | Freq: Every day | ORAL | 0 refills | Status: DC

## 2018-09-07 MED ORDER — METFORMIN HCL ER 500 MG PO TB24: 500.0000 mg | ORAL_TABLET | Freq: Every day | ORAL | 0 refills | Status: DC

## 2018-09-07 MED ORDER — AMLODIPINE BESYLATE 5 MG PO TABS: 5.0000 mg | ORAL_TABLET | Freq: Every day | ORAL | 0 refills | Status: DC

## 2018-09-07 MED ORDER — ALENDRONATE SODIUM 70 MG PO TABS: 70.0000 mg | ORAL_TABLET | ORAL | 0 refills | Status: DC

## 2018-09-07 MED ORDER — LEVOTHYROXINE SODIUM 112 MCG PO TABS: 112.0000 ug | ORAL_TABLET | Freq: Every day | ORAL | 0 refills | Status: DC

## 2018-09-07 MED ORDER — LATANOPROST 0.005 % OP SOLN: 1.0000 [drp] | Freq: Every day | OPHTHALMIC | 0 refills | Status: AC

## 2018-09-07 MED ORDER — CHLORTHALIDONE 25 MG PO TABS: 25.0000 mg | ORAL_TABLET | Freq: Every day | ORAL | 0 refills | Status: DC

## 2018-09-07 NOTE — ED Provider Notes (Signed)
[facility]  CSN: [id number] Arrival date & time: 09/07/18  0940  History   Chief Complaint Chief Complaint  Patient presents with  . Hypertension   HPI  72 year old female presents for medication refill.  Patient states that she has recently moved from [place] to [place].  Patient was previously in the [place] program and they provided her medication.  Since her move she has been out of her medication.  She is feeling fairly well.  She does have intermittent dizziness which she describes as vertigo.  Patient is requesting refills on her medications, see triage note.  No reports of chest pain or shortness of breath.  Feels well.  No other complaints or concerns this time.  PMH, Surgical Hx, Family Hx, Social History reviewed and updated as below.  Past Medical History:  Diagnosis Date  . Depression   . GERD (gastroesophageal reflux disease)   . Glaucoma   . Hemiparesis ([place])    left side  . High cholesterol   . History of seizure    x 1 - after a spider bite  . History of stroke with residual deficit    left-side weakness  . Hypertension    states BP under control with meds., has been on med. x 2 yr.  . Hypothyroidism   . Non-insulin dependent type 2 diabetes mellitus ([place])   . Overactive bladder   . Stroke [facility])    1998 weakness on left side    Patient Active Problem List   Diagnosis Date Noted  . Abnormal mammogram of left breast 07/30/2018  . Closed displaced oblique fracture of shaft of left humerus 03/14/2018  . Osteopenia 01/20/2018  . Multiple thyroid nodules 07/13/2017  . Tracheal deviation 07/13/2017  . Malignant neoplasm of upper-outer quadrant of right breast in female, estrogen receptor positive ([place]) 01/20/2017  . Primary vulvar squamous cell carcinoma ([place]) 01/20/2017    Past Surgical History:  Procedure Laterality Date  . ABDOMINAL HYSTERECTOMY     complete  . BREAST BIOPSY Left 08/03/2018   Procedure: LEFT BREAST BIOPSY WITH NEEDLE  LOCALIZATION;  Surgeon: Armandina Gemma, MD;  Location: [place];  Service: General;  Laterality: Left;  . BREAST LUMPECTOMY Right   . CATARACT EXTRACTION W/ INTRAOCULAR LENS IMPLANT Left   . CEREBRAL ANEURYSM REPAIR  1998  . PICC LINE INSERTION    . THYROID LOBECTOMY Right 07/15/2017   Procedure: RIGHT THYROID LOBECTOMY;  Surgeon: Armandina Gemma, MD;  Location: [place];  Service: General;  Laterality: Right;    OB History   None      Home Medications    Prior to Admission medications   Medication Sig Start Date End Date Taking? Authorizing Provider  aspirin EC 81 MG tablet Take 81 mg by mouth daily.   Yes [provider]  cholecalciferol (VITAMIN D) 1000 units tablet Take 1,000 Units by mouth daily.   Yes [provider]  Probiotic Product (PROBIOTIC DAILY PO) Take by mouth.   Yes [provider]  ranitidine (ZANTAC) 75 MG tablet Take 75 mg by mouth daily.   Yes [provider]  traMADol (ULTRAM) 50 MG tablet Take 1-2 tablets (50-100 mg total) by mouth every 6 (six) hours as needed for moderate pain. 08/03/18  Yes Armandina Gemma, MD  alendronate (FOSAMAX) 70 MG tablet Take 1 tablet (70 mg total) by mouth once a week. Take with a full glass of water on an empty stomach. 09/07/18   Coral Spikes, DO  amLODipine (NORVASC) 5 MG tablet Take 1 tablet (5 mg total) by mouth daily. 09/07/18   Coral Spikes, DO  chlorthalidone (HYGROTON) 25 MG tablet Take 1 tablet (25 mg total) by mouth daily. 09/07/18   Aamari Strawderman, Barnie Del, DO  latanoprost (XALATAN) 0.005 % ophthalmic solution Place 1 drop into both eyes at bedtime. 09/07/18   Coral Spikes, DO  letrozole [facility]) 2.5 MG tablet Take 1 tablet (2.5 mg total) by mouth daily. 09/07/18   Coral Spikes, DO  levothyroxine (SYNTHROID, LEVOTHROID) 112 MCG tablet Take 1 tablet (112 mcg total) by mouth daily before breakfast. 09/07/18   Coral Spikes, DO  losartan (COZAAR) 100 MG tablet Take 1 tablet (100 mg total) by mouth  daily. 09/07/18   Coral Spikes, DO  metFORMIN (GLUCOPHAGE-XR) 500 MG 24 hr tablet Take 1 tablet (500 mg total) by mouth daily. 09/07/18   Coral Spikes, DO  mirabegron ER (MYRBETRIQ) 25 MG TB24 tablet Take 1 tablet (25 mg total) by mouth daily. 09/07/18   Coral Spikes, DO    Family History History reviewed. No pertinent family history.  Social History Social History   Tobacco Use  . Smoking status: Never Smoker  . Smokeless tobacco: Never Used  Substance Use Topics  . Alcohol use: No  . Drug use: No     Allergies   Levemir [insulin detemir]   Review of Systems Review of Systems  Respiratory: Negative.   Cardiovascular: Negative.   Neurological: Positive for dizziness.   Physical Exam Triage Vital Signs ED Triage Vitals  Enc Vitals Group     BP 09/07/18 1007 (!) 133/94     Pulse Rate 09/07/18 1007 (!) 106     Resp 09/07/18 1007 16     Temp 09/07/18 1007 99 F (37.2 C)     Temp Source 09/07/18 1007 Oral     SpO2 09/07/18 1007 100 %     Weight 09/07/18 1004 151 lb (68.5 kg)     Height 09/07/18 1004 5\' 2"  (1.575 m)     Head Circumference --      Peak Flow --      Pain Score 09/07/18 1004 0     Pain Loc --      Pain Edu? --      Excl. in [place]? --    Updated Vital Signs BP (!) 133/94 (BP Location: Left Arm)   Pulse (!) 106   Temp 99 F (37.2 C) (Oral)   Resp 16   Ht 5\' 2"  (1.575 m)   Wt 68.5 kg   SpO2 100%   BMI 27.62 kg/m   Visual Acuity Right Eye Distance:   Left Eye Distance:   Bilateral Distance:    Right Eye Near:   Left Eye Near:    Bilateral Near:     Physical Exam  Constitutional: She appears well-developed. No distress.  HENT:  Head: Normocephalic and atraumatic.  Cardiovascular:  Mild tachycardia.  Regular rhythm.  Pulmonary/Chest: Effort normal.  Abdominal: Soft. She exhibits no distension. There is no tenderness.  Neurological: She is alert.  Psychiatric: She has a normal mood and affect. Her behavior is normal.  Nursing note and  vitals reviewed.  UC Treatments / Results  Labs (all labs ordered are listed, but only abnormal results are displayed) Labs Reviewed - No data to display  EKG None  Radiology No results found.  Procedures Procedures (including critical care time)  Medications Ordered in UC Medications - No  data to display  Initial Impression / Assessment and Plan / UC Course  I have reviewed the triage vital signs and the nursing notes.  Pertinent labs & imaging results that were available during my care of the patient were reviewed by me and considered in my medical decision making (see chart for details).    72 year old female presents for medication refill.  Patient's medications were refilled.  Advised her to establish with a primary care physician.  Final Clinical Impressions(s) / UC Diagnoses   Final diagnoses:  Medication refill     Discharge Instructions     Continue your medications.  Please establish with a physician.  Take care  Dr. Lacinda Axon    ED Prescriptions    Medication Sig Dispense Auth. Provider   alendronate (FOSAMAX) 70 MG tablet Take 1 tablet (70 mg total) by mouth once a week. Take with a full glass of water on an empty stomach. 12 tablet Makailah Slavick G, DO   amLODipine (NORVASC) 5 MG tablet Take 1 tablet (5 mg total) by mouth daily. 90 tablet Nicole Hafley G, DO   chlorthalidone (HYGROTON) 25 MG tablet Take 1 tablet (25 mg total) by mouth daily. 90 tablet Launa Goedken G, DO   latanoprost (XALATAN) 0.005 % ophthalmic solution Place 1 drop into both eyes at bedtime. 2.5 mL Lacinda Axon, Meggie Laseter G, DO   letrozole [facility]) 2.5 MG tablet Take 1 tablet (2.5 mg total) by mouth daily. 90 tablet Nataki Mccrumb G, DO   levothyroxine (SYNTHROID, LEVOTHROID) 112 MCG tablet Take 1 tablet (112 mcg total) by mouth daily before breakfast. 90 tablet Crystal Ellwood G, DO   losartan (COZAAR) 100 MG tablet Take 1 tablet (100 mg total) by mouth daily. 90 tablet Mike Hamre G, DO   metFORMIN  (GLUCOPHAGE-XR) 500 MG 24 hr tablet Take 1 tablet (500 mg total) by mouth daily. 90 tablet Hannah Crill G, DO   mirabegron ER (MYRBETRIQ) 25 MG TB24 tablet Take 1 tablet (25 mg total) by mouth daily. 90 tablet Coral Spikes, DO     Controlled Substance Prescriptions  Controlled Substance Registry consulted? Not Applicable   Coral Spikes, DO 09/07/18 1053

## 2018-09-07 NOTE — Discharge Instructions (Signed)
Continue your medications.  Please establish with a physician.  Take care  Dr. Lacinda Axon

## 2018-09-07 NOTE — ED Triage Notes (Signed)
Pt is here today because she recently from [place] and she was going to [facility] to get her medications. She recently moved to [facility] and is working on getting into [employer] but has not be able to her medications yet. She needs Amlodipine, fosamax, chlorthalidone, latanoprost, Femara, Levothyroxine, Losartan, Metformin, Myrbetriq, and ranitidine. She has been out of her medications for about a week. She is having some dizziness.

## 2018-09-16 ENCOUNTER — Other Ambulatory Visit: Payer: Self-pay | Admitting: Family Medicine

## 2018-12-28 ENCOUNTER — Telehealth: Payer: Self-pay | Admitting: Hematology and Oncology

## 2018-12-28 NOTE — Telephone Encounter (Signed)
Dr. Lindi Adie PAL, moved 12/14 to 02/17. Left patient a message regarding new appointment. Mailed out new schedule.

## 2019-01-16 ENCOUNTER — Other Ambulatory Visit: Payer: Self-pay | Admitting: Family

## 2019-01-16 DIAGNOSIS — Z1231 Encounter for screening mammogram for malignant neoplasm of breast: Secondary | ICD-10-CM

## 2019-01-19 ENCOUNTER — Ambulatory Visit: Payer: Medicare (Managed Care) | Admitting: Hematology and Oncology

## 2019-01-22 ENCOUNTER — Ambulatory Visit: Payer: Medicare HMO | Admitting: Hematology and Oncology

## 2019-01-22 NOTE — Assessment & Plan Note (Signed)
Right lumpectomy08/07/2015: IDC grade 2, 2.5 cm, 1/2 sentinel nodes positive, DCIS, LCIS, lateral inferior and deep margins positive for LCIS, T2 N1 M0 stage II a, radiation did not recommend adjuvant radiation ( at [facility]) Current treatment: Letrozole 2.5 mg dailyPreviouslyunder the care of Dr. Leonard Downing at [facility] Patient like to transition her care to [place].   Breast cancer surveillance:  Mammogram and ultrasound and biopsy 07/25/2018: Followed by ultrasound-guided needle biopsy: Benign fibroadipose tissue with sclerosing fibrosis left breast 1 o'clock position   Letrozole toxicities: No major side effects or tolerating itVery well. She will need a bone density test (previous fracture of the left humeral shaft): Osteopenia  Uterine cancer: In remission Vulvar cancer: Status post resection.In remission  Left-sided stroke with left arm swelling: Chronic in nature  Left arm humerus fracture: Seeing orthopedics  Return to clinic in 1 year for follow-up

## 2019-01-29 ENCOUNTER — Telehealth: Payer: Self-pay | Admitting: Hematology and Oncology

## 2019-01-29 NOTE — Telephone Encounter (Signed)
Patients center called to reschedule

## 2019-02-15 NOTE — Progress Notes (Signed)
Patient Care Team: Angelica Pou, MD as PCP - General (Internal Medicine)  DIAGNOSIS:    ICD-10-CM   1. Malignant neoplasm of upper-outer quadrant of right breast in female, estrogen receptor positive ([place]) C50.411    Z17.0     SUMMARY OF ONCOLOGIC HISTORY:   Malignant neoplasm of upper-outer quadrant of right breast in female, estrogen receptor positive ([place])   03/14/2015 Initial Diagnosis    On workup for blood work cancer PET/CT showed right breast lesion, 11:00 position 2.6 cm diameter biopsy done 05/20/2015 IDC grade 2 ER 95% PR 80% HER-2 negative    07/14/2015 Surgery    Right lumpectomy: IDC grade 2, 2.5 cm, 1/2 sentinel nodes positive, DCIS, LCIS, lateral inferior and deep margins positive for LCIS, T2 N1 M0 stage II a, radiation did not recommend adjuvant radiation ( at [facility])    08/21/2015 -  Anti-estrogen oral therapy    Letrozole 2.5 mg daily     Primary vulvar squamous cell carcinoma ([place])   03/25/2015 Surgery    Vulvectomy by Dr. [name] at [facility]; invasive squamous cell carcinoma margins are negative, did not require any further treatment     CHIEF COMPLIANT: Follow-up on letrozole therapy  INTERVAL HISTORY: Kirsten Mcmahon is a 73 y.o. with above-mentioned history of right breast cancer treated with lumpectomy and is currently on letrozole therapy. I last saw her a year ago and in the interim she presented to the ED after a mammogram on 07/21/19 showed a suspicious left breast mass and probably benign right breast mass. The left breast mass was seen to be benign and removed on 08/03/18. She presents to the clinic today for annual follow-up on letrozole.  REVIEW OF SYSTEMS:   Constitutional: Denies fevers, chills or abnormal weight loss Eyes: Denies blurriness of vision Ears, nose, mouth, throat, and face: Denies mucositis or sore throat Respiratory: Denies cough, dyspnea or wheezes Cardiovascular: Denies palpitation, chest discomfort  Gastrointestinal: Denies nausea, heartburn or change in bowel habits Skin: Denies abnormal skin rashes Lymphatics: Denies new lymphadenopathy or easy bruising Neurological: Left arm weakness and swelling Behavioral/Psych: Mood is stable, no new changes  Extremities: No lower extremity edema Breast: denies any pain or lumps or nodules in either breasts All other systems were reviewed with the patient and are negative.  I have reviewed the past medical history, past surgical history, social history and family history with the patient and they are unchanged from previous note.  ALLERGIES:  is allergic to levemir [insulin detemir].  MEDICATIONS:  Current Outpatient Medications  Medication Sig Dispense Refill  . alendronate (FOSAMAX) 70 MG tablet Take 1 tablet (70 mg total) by mouth once a week. Take with a full glass of water on an empty stomach. 12 tablet 0  . amLODipine (NORVASC) 5 MG tablet Take 1 tablet (5 mg total) by mouth daily. 90 tablet 0  . aspirin EC 81 MG tablet Take 81 mg by mouth daily.    . chlorthalidone (HYGROTON) 25 MG tablet Take 1 tablet (25 mg total) by mouth daily. 90 tablet 0  . cholecalciferol (VITAMIN D) 1000 units tablet Take 1,000 Units by mouth daily.    Marland Kitchen latanoprost (XALATAN) 0.005 % ophthalmic solution Place 1 drop into both eyes at bedtime. 2.5 mL 0  . letrozole (FEMARA) 2.5 MG tablet Take 1 tablet (2.5 mg total) by mouth daily. 90 tablet 0  . levothyroxine (SYNTHROID, LEVOTHROID) 112 MCG tablet Take 1 tablet (112 mcg total) by mouth daily before  breakfast. 90 tablet 0  . losartan (COZAAR) 100 MG tablet Take 1 tablet (100 mg total) by mouth daily. 90 tablet 0  . metFORMIN (GLUCOPHAGE-XR) 500 MG 24 hr tablet Take 1 tablet (500 mg total) by mouth daily. 90 tablet 0  . mirabegron ER (MYRBETRIQ) 25 MG TB24 tablet Take 1 tablet (25 mg total) by mouth daily. 90 tablet 0  . Probiotic Product (PROBIOTIC DAILY PO) Take by mouth.    . ranitidine (ZANTAC) 75 MG tablet  Take 75 mg by mouth daily.    . traMADol (ULTRAM) 50 MG tablet Take 1-2 tablets (50-100 mg total) by mouth every 6 (six) hours as needed for moderate pain. 15 tablet 0   No current facility-administered medications for this visit.     PHYSICAL EXAMINATION: ECOG PERFORMANCE STATUS: 1 - Symptomatic but completely ambulatory  Vitals:   02/16/19 1114  BP: (!) 143/83  Pulse: 100  Resp: 18  Temp: 100.1 F (37.8 C)  SpO2: 100%   Filed Weights   02/16/19 1114  Weight: 157 lb 11.2 oz (71.5 kg)    GENERAL: alert, no distress and comfortable SKIN: skin color, texture, turgor are normal, no rashes or significant lesions EYES: normal, Conjunctiva are pink and non-injected, sclera clear OROPHARYNX: no exudate, no erythema and lips, buccal mucosa, and tongue normal  NECK: supple, thyroid normal size, non-tender, without nodularity LYMPH: no palpable lymphadenopathy in the cervical, axillary or inguinal LUNGS: clear to auscultation and percussion with normal breathing effort HEART: regular rate & rhythm and no murmurs and no lower extremity edema ABDOMEN: abdomen soft, non-tender and normal bowel sounds MUSCULOSKELETAL: no cyanosis of digits and no clubbing  NEURO: Left arm paralysis from stroke, severe left arm swelling EXTREMITIES: No lower extremity edema BREAST: No palpable masses or nodules in either right or left breasts.  Extensive scar tissue from prior surgery.  No palpable axillary supraclavicular or infraclavicular adenopathy no breast tenderness or nipple discharge. (exam performed in the presence of a chaperone)  LABORATORY DATA:  I have reviewed the data as listed CMP Latest Ref Rng & Units 08/01/2018 07/16/2017 07/07/2017  Glucose 70 - 99 mg/dL 224(H) 246(H) 139(H)  BUN 8 - 23 mg/dL 26(H) 20 20  Creatinine 0.44 - 1.00 mg/dL 1.66(H) 1.44(H) 1.45(H)  Sodium 135 - 145 mmol/L 140 133(L) 137  Potassium 3.5 - 5.1 mmol/L 3.7 4.5 3.8  Chloride 98 - 111 mmol/L 107 101 103  CO2 22 - 32  mmol/L '25 23 26  ' Calcium 8.9 - 10.3 mg/dL 9.1 8.7(L) 9.3  Total Protein 6.5 - 8.1 g/dL - - -  Total Bilirubin 0.3 - 1.2 mg/dL - - -  Alkaline Phos 38 - 126 U/L - - -  AST 15 - 41 U/L - - -  ALT 14 - 54 U/L - - -    Lab Results  Component Value Date   WBC 6.5 07/07/2017   HGB 12.2 07/07/2017   HCT 37.8 07/07/2017   MCV 90.0 07/07/2017   PLT 336 07/07/2017   NEUTROABS 10.1 (H) 12/24/2016    ASSESSMENT & PLAN:  Malignant neoplasm of upper-outer quadrant of right breast in female, estrogen receptor positive ([place]) Right lumpectomy08/07/2015: IDC grade 2, 2.5 cm, 1/2 sentinel nodes positive, DCIS, LCIS, lateral inferior and deep margins positive for LCIS, T2 N1 M0 stage II a, radiation did not recommend adjuvant radiation ( at [facility]) Current treatment: Letrozole 2.5 mg dailyPreviouslyunder the care of Dr. [facility]ing at [facility] Patient like to  transition her care to [place].   Breast cancer surveillance:  Mammogram and ultrasound and biopsy 07/25/2018: Followed by ultrasound-guided needle biopsy: Benign fibroadipose tissue with sclerosing fibrosis left breast 1 o'clock position Breast exam 02/16/2019: Benign   Letrozole toxicities: No major side effects or tolerating itVery well. She will need a bone density test (previous fracture of the left humeral shaft): Osteopenia  Uterine cancer: In remission Vulvar cancer: Status post resection.In remission  Left-sided stroke with left arm swelling: Chronic in nature  Left arm humerus fracture: Seeing orthopedics  Return to clinic in 1 year for follow-up    No orders of the defined types were placed in this encounter.  The patient has a good understanding of the overall plan. she agrees with it. she will call with any problems that may develop before the next visit here.  Nicholas Lose, MD 02/16/2019  Julious Oka Dorshimer am acting as scribe for Dr. Nicholas Lose.  I have reviewed the above documentation  for accuracy and completeness, and I agree with the above.

## 2019-02-16 ENCOUNTER — Other Ambulatory Visit: Payer: Self-pay

## 2019-02-16 ENCOUNTER — Telehealth: Payer: Self-pay | Admitting: Hematology and Oncology

## 2019-02-16 ENCOUNTER — Inpatient Hospital Stay: Payer: Medicare (Managed Care) | Attending: Hematology and Oncology | Admitting: Hematology and Oncology

## 2019-02-16 DIAGNOSIS — Z8673 Personal history of transient ischemic attack (TIA), and cerebral infarction without residual deficits: Secondary | ICD-10-CM | POA: Diagnosis not present

## 2019-02-16 DIAGNOSIS — C50411 Malignant neoplasm of upper-outer quadrant of right female breast: Secondary | ICD-10-CM | POA: Insufficient documentation

## 2019-02-16 DIAGNOSIS — Z170 Estrogen receptor positive status [ER+]: Secondary | ICD-10-CM | POA: Diagnosis not present

## 2019-02-16 MED ORDER — LETROZOLE 2.5 MG PO TABS: 2.5000 mg | ORAL_TABLET | Freq: Every day | ORAL | 3 refills | Status: DC

## 2019-02-16 NOTE — Telephone Encounter (Signed)
Gave avs and calendar ° °

## 2019-02-16 NOTE — Assessment & Plan Note (Addendum)
Right lumpectomy08/07/2015: IDC grade 2, 2.5 cm, 1/2 sentinel nodes positive, DCIS, LCIS, lateral inferior and deep margins positive for LCIS, T2 N1 M0 stage II a, radiation did not recommend adjuvant radiation ( at [facility]) Current treatment: Letrozole 2.5 mg dailyPreviouslyunder the care of Dr. Leonard Downing at [facility] Patient like to transition her care to [place].   Breast cancer surveillance:  Mammogram and ultrasound and biopsy 07/25/2018: Followed by ultrasound-guided needle biopsy: Benign fibroadipose tissue with sclerosing fibrosis left breast 1 o'clock position Breast exam 02/16/2019: Benign   Letrozole toxicities: No major side effects or tolerating itVery well. She will need a bone density test (previous fracture of the left humeral shaft): Osteopenia  Uterine cancer: In remission Vulvar cancer: Status post resection.In remission  Left-sided stroke with left arm swelling: Chronic in nature  Left arm humerus fracture: Seeing orthopedics  Return to clinic in 1 year for follow-up

## 2019-06-27 ENCOUNTER — Other Ambulatory Visit: Payer: Self-pay | Admitting: Family

## 2019-06-27 DIAGNOSIS — R0602 Shortness of breath: Secondary | ICD-10-CM

## 2019-07-02 ENCOUNTER — Other Ambulatory Visit: Payer: Self-pay

## 2019-07-02 ENCOUNTER — Ambulatory Visit
Admission: RE | Admit: 2019-07-02 | Discharge: 2019-07-02 | Disposition: A | Discharge status: Home / Self Care | Payer: Medicare (Managed Care) | Source: Ambulatory Visit | Attending: Family | Admitting: Family

## 2019-07-02 DIAGNOSIS — R609 Edema, unspecified: Secondary | ICD-10-CM | POA: Diagnosis not present

## 2019-07-02 DIAGNOSIS — I10 Essential (primary) hypertension: Secondary | ICD-10-CM | POA: Diagnosis not present

## 2019-07-02 DIAGNOSIS — R0602 Shortness of breath: Secondary | ICD-10-CM | POA: Diagnosis not present

## 2019-07-02 DIAGNOSIS — I358 Other nonrheumatic aortic valve disorders: Secondary | ICD-10-CM | POA: Diagnosis not present

## 2019-07-02 DIAGNOSIS — E119 Type 2 diabetes mellitus without complications: Secondary | ICD-10-CM | POA: Diagnosis not present

## 2019-07-02 NOTE — Progress Notes (Signed)
*  PRELIMINARY RESULTS* Echocardiogram 2D Echocardiogram has been performed.  Kirsten Mcmahon 07/02/2019, 11:54 AM

## 2019-07-24 ENCOUNTER — Other Ambulatory Visit: Payer: Self-pay

## 2019-07-24 ENCOUNTER — Ambulatory Visit
Admission: RE | Admit: 2019-07-24 | Discharge: 2019-07-24 | Disposition: A | Discharge status: Home / Self Care | Payer: Medicare (Managed Care) | Source: Ambulatory Visit | Attending: Family | Admitting: Family

## 2019-07-24 DIAGNOSIS — Z1231 Encounter for screening mammogram for malignant neoplasm of breast: Secondary | ICD-10-CM | POA: Insufficient documentation

## 2019-07-24 IMAGING — MG DIGITAL SCREENING BILATERAL MAMMOGRAM WITH TOMO AND CAD
6 series · 6 of 18 positions shown · non-contrast
Comparison: Previous exam(s).

CLINICAL DATA: Screening. History of RIGHT breast cancer and
lumpectomy.

EXAM:
DIGITAL SCREENING BILATERAL MAMMOGRAM WITH TOMO AND CAD

[R MLO synth-2D]
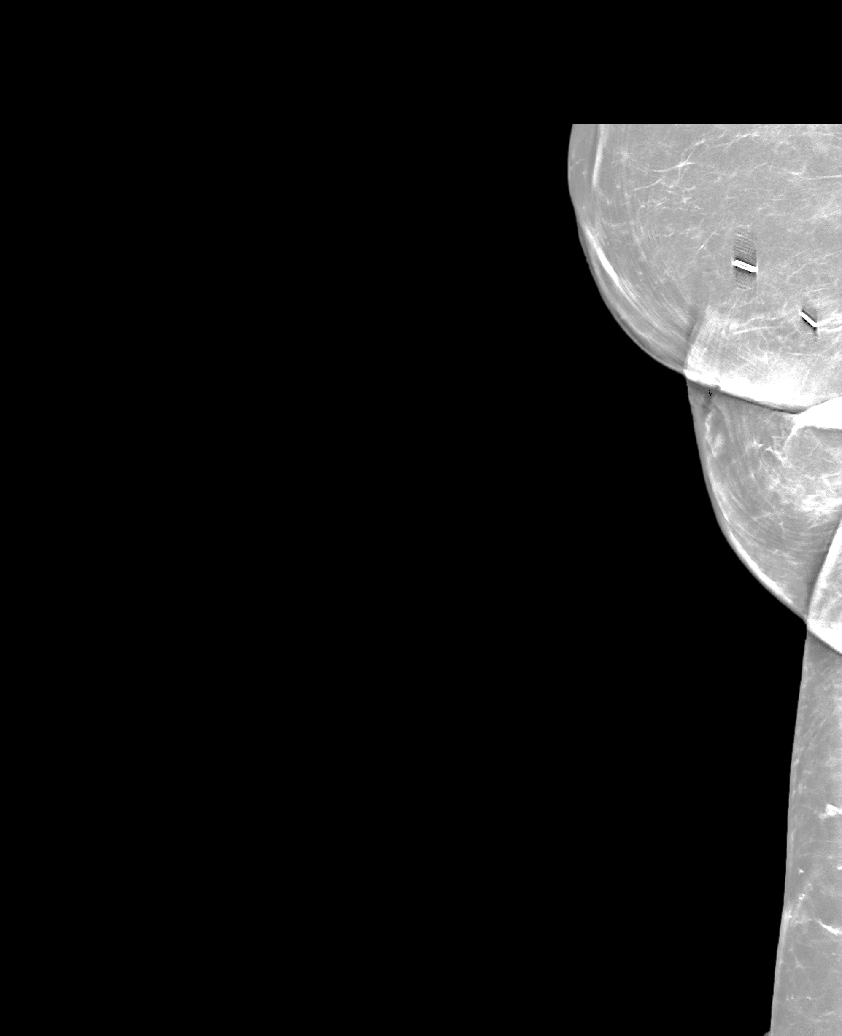

[L CC synth-2D]
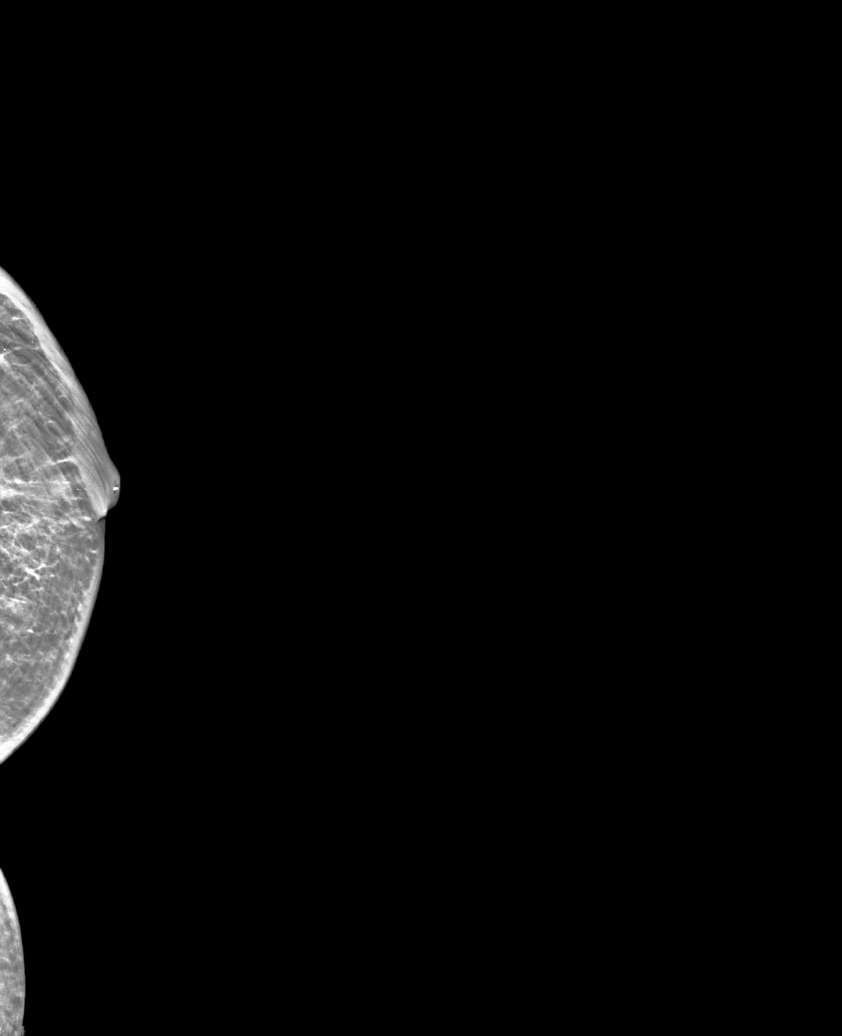

[R CC synth-2D]
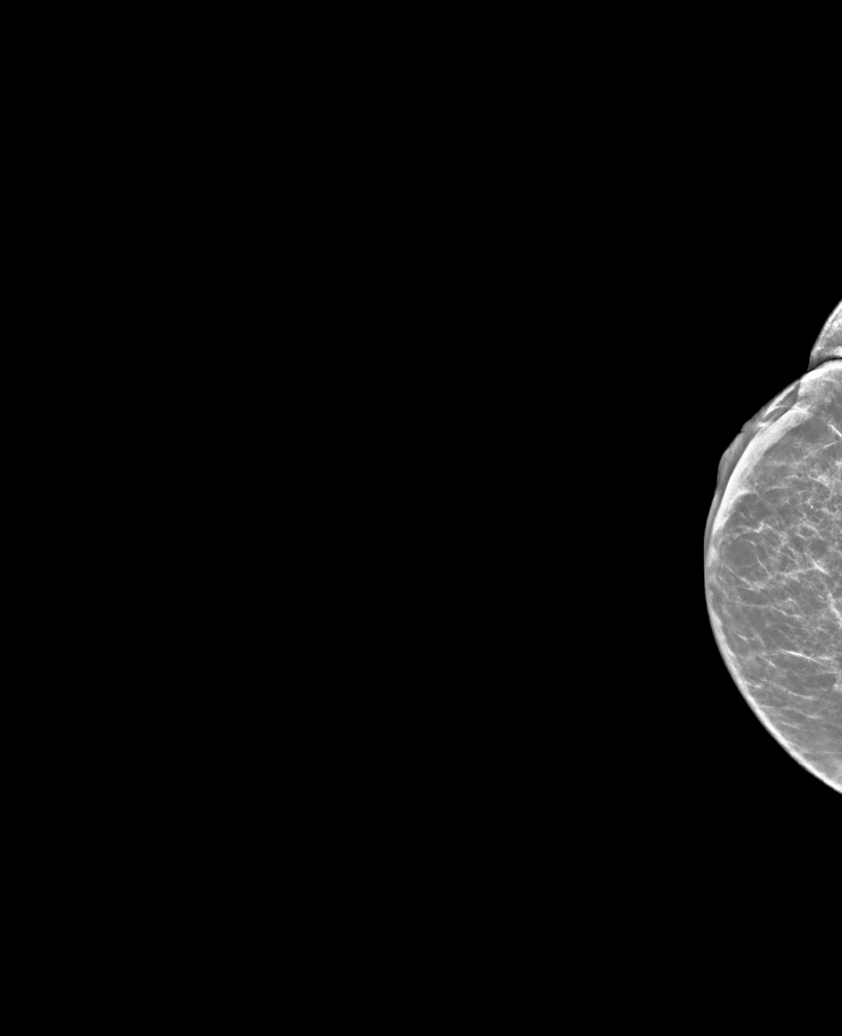

[R MLO tomo · tomo slice 39/77.0]
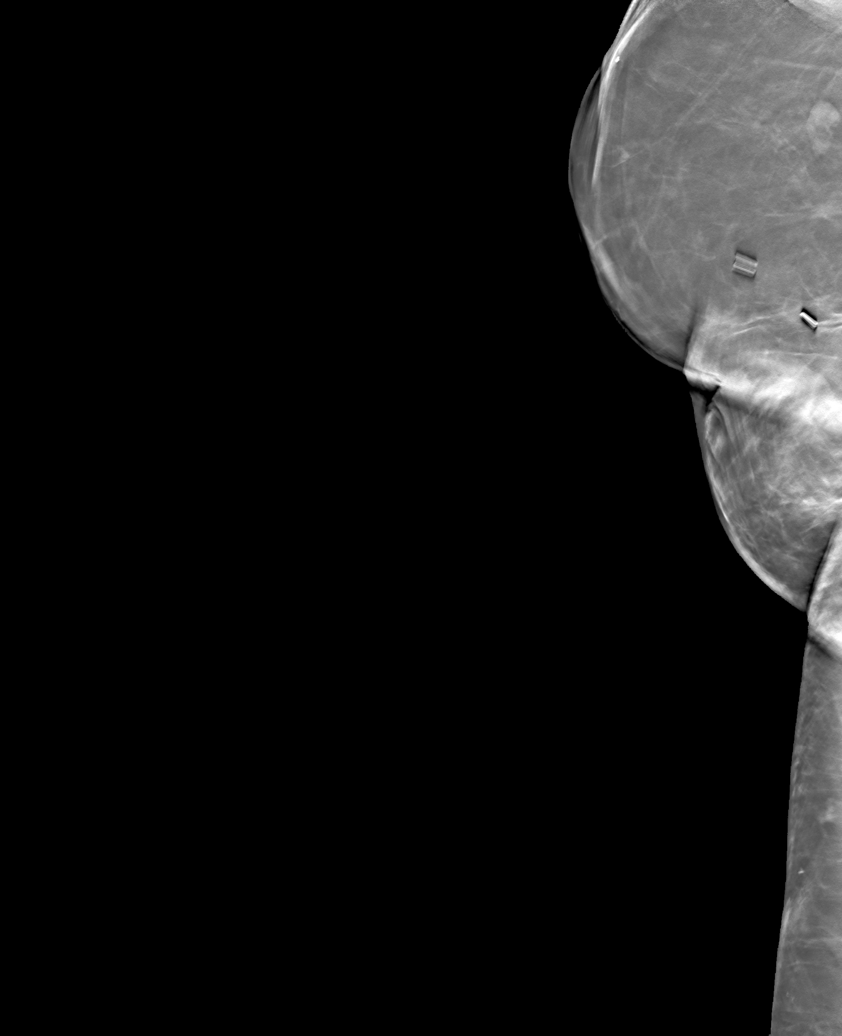

[R CC tomo · tomo slice 21/42.0]
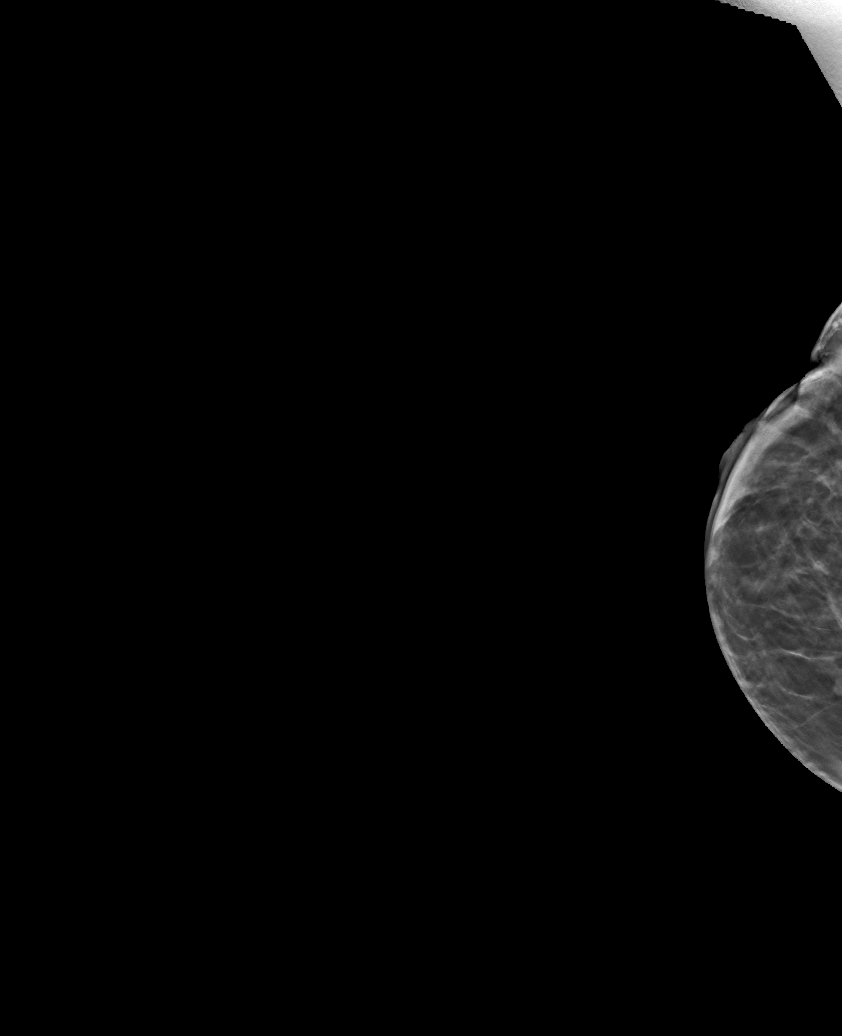

[L CC tomo · tomo slice 19/37.0]
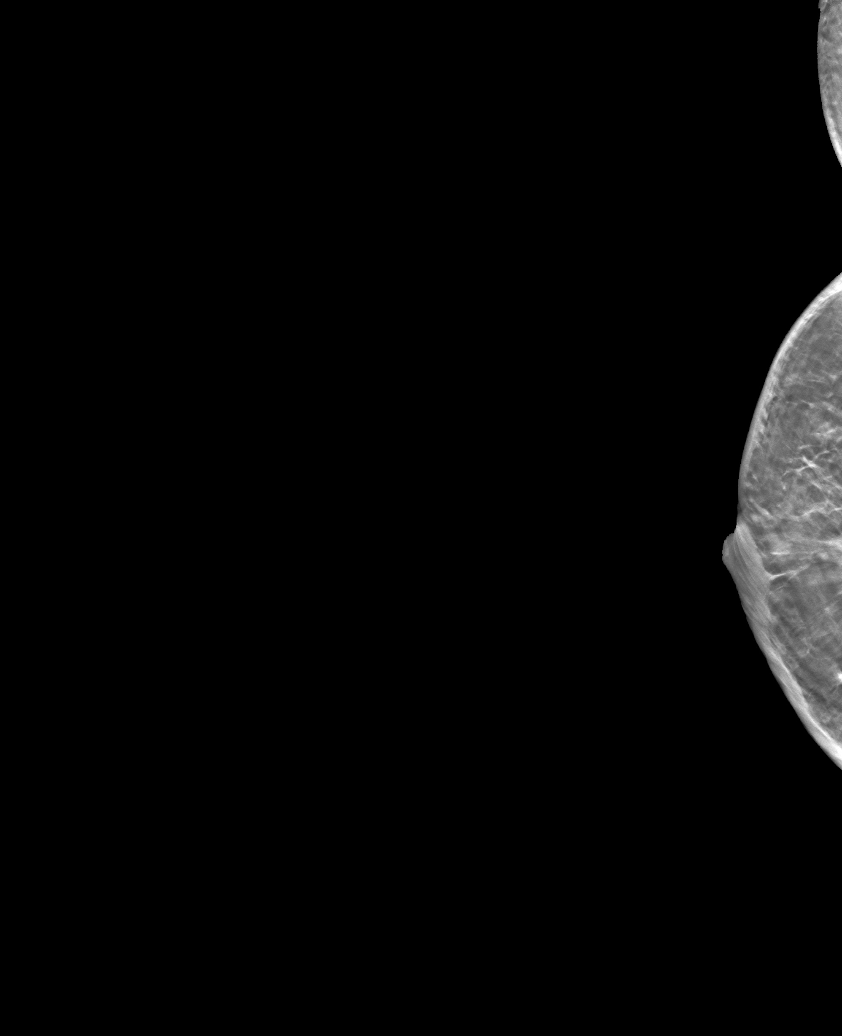

[6 of 18 positions shown; findings below may reference images not displayed]

ACR Breast Density Category b: There are scattered areas of
fibroglandular density.
FINDINGS: A LEFT MLO image could not be performed due to inability to move her
LEFT arm due to prior CVA.

There are no findings suspicious for malignancy. Surgical changes
within the RIGHT breast again noted. Images were processed with CAD.
IMPRESSION: No mammographic evidence of malignancy. A result letter of this
screening mammogram will be mailed directly to the patient.

RECOMMENDATION:
Screening mammogram in one year. (Code:[NF])

BI-RADS CATEGORY  2: Benign.

## 2020-02-18 ENCOUNTER — Inpatient Hospital Stay: Payer: Medicare (Managed Care) | Attending: Hematology and Oncology | Admitting: Hematology and Oncology

## 2020-02-18 NOTE — Assessment & Plan Note (Deleted)
Right lumpectomy08/07/2015: IDC grade 2, 2.5 cm, 1/2 sentinel nodes positive, DCIS, LCIS, lateral inferior and deep margins positive for LCIS, T2 N1 M0 stage II a, radiation did not recommend adjuvant radiation ( at [facility]) Current treatment: Letrozole 2.5 mg dailyPreviouslyunder the care of Dr. Leonard Downing at [facility] Patient like to transition her care to [place].   Breast cancer surveillance:  Mammogram and ultrasound and biopsy 07/24/2019: No evidence of malignancy breast density category B Breast exam 02/18/2020: Benign   Letrozole toxicities: No major side effects or tolerating itVery well. She will need a bone density test (previous fracture of the left humeral shaft): Osteopenia  Uterine cancer: In remission Vulvar cancer: Status post resection.In remission  Left-sided stroke with left arm swelling: Chronic in nature Left arm humerus fracture: Seeing orthopedics  Return to clinic in 1 year for follow-up

## 2020-10-01 ENCOUNTER — Other Ambulatory Visit: Payer: Self-pay | Admitting: Family

## 2020-10-01 ENCOUNTER — Ambulatory Visit: Admission: RE | Admit: 2020-10-01 | Payer: Medicare (Managed Care) | Source: Ambulatory Visit

## 2020-10-01 DIAGNOSIS — N184 Chronic kidney disease, stage 4 (severe): Secondary | ICD-10-CM

## 2020-10-02 ENCOUNTER — Ambulatory Visit: Payer: Medicare (Managed Care) | Attending: Family

## 2020-10-03 ENCOUNTER — Other Ambulatory Visit: Payer: Self-pay | Admitting: Family

## 2020-10-03 DIAGNOSIS — N184 Chronic kidney disease, stage 4 (severe): Secondary | ICD-10-CM

## 2020-10-08 ENCOUNTER — Ambulatory Visit
Admission: RE | Admit: 2020-10-08 | Discharge: 2020-10-08 | Disposition: A | Discharge status: Home / Self Care | Payer: Medicare (Managed Care) | Source: Ambulatory Visit | Attending: Family | Admitting: Family

## 2020-10-08 ENCOUNTER — Other Ambulatory Visit: Payer: Self-pay

## 2020-10-08 DIAGNOSIS — N184 Chronic kidney disease, stage 4 (severe): Secondary | ICD-10-CM | POA: Diagnosis not present

## 2020-10-08 IMAGING — US US RENAL ARTERY STENOSIS
1 series · 15 of 25 positions shown · non-contrast
Comparison: None.

CLINICAL DATA: 74-year-old female with a history chronic kidney
disease

EXAM:
RENAL/URINARY TRACT ULTRASOUND
RENAL DUPLEX DOPPLER ULTRASOUND

[Series 1: us renal artery duplex · 15 of 48 slices shown]
[im 1/48]
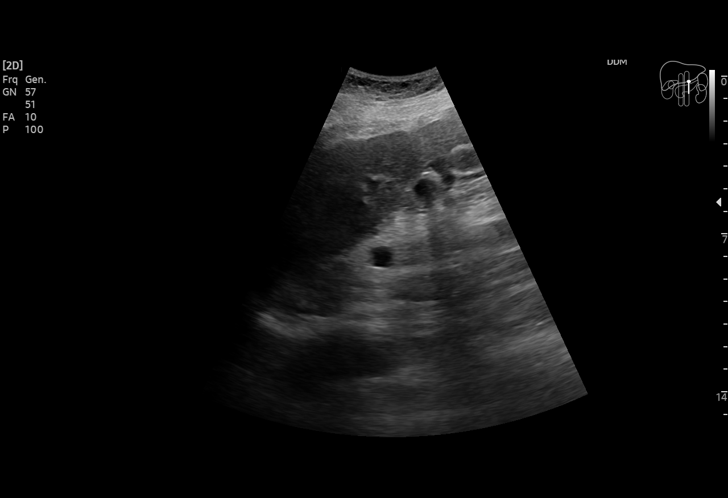
[im 4/48]
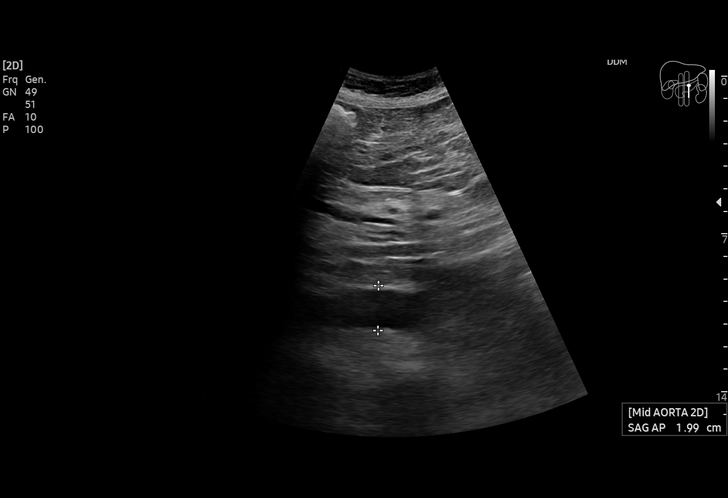
[im 8/48]
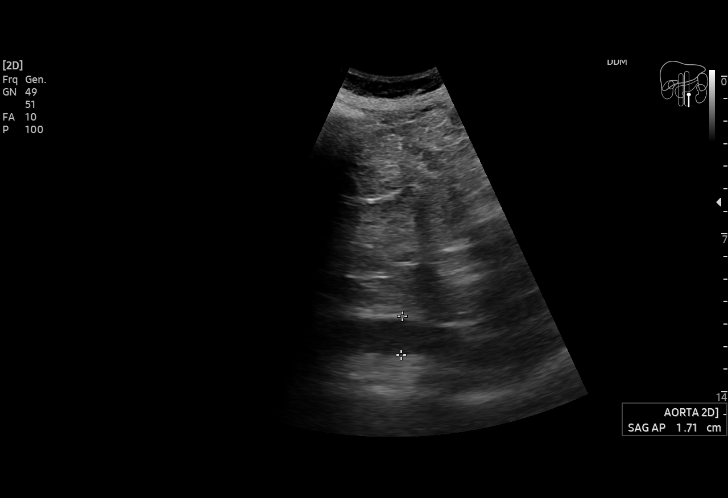
[im 10/48]
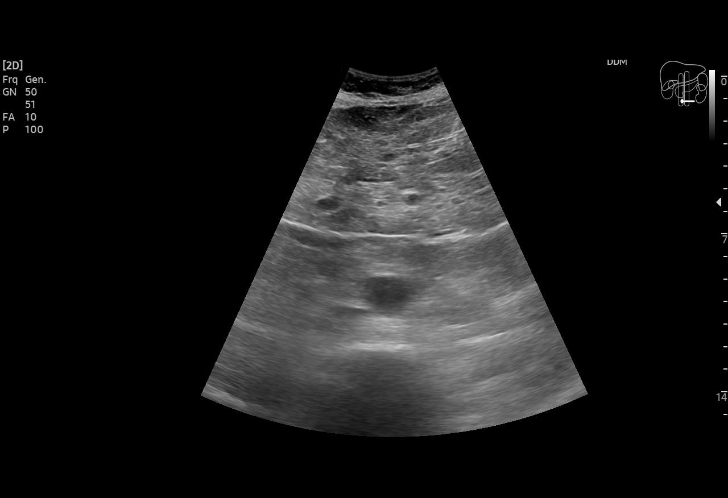
[im 14/48]
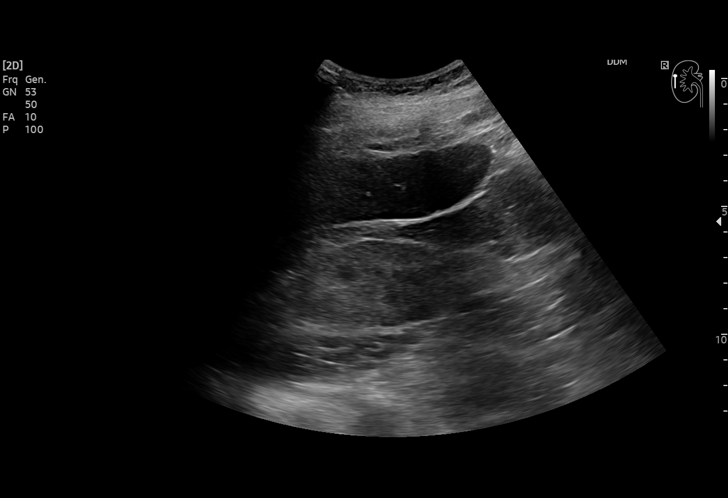
[im 18/48]
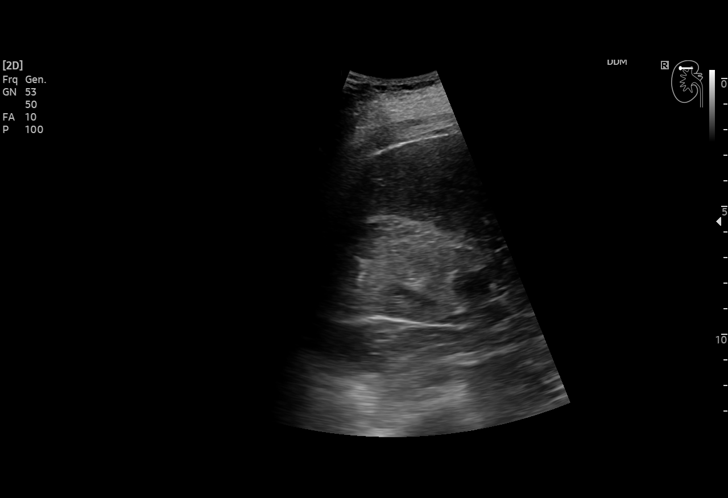
[im 20/48]
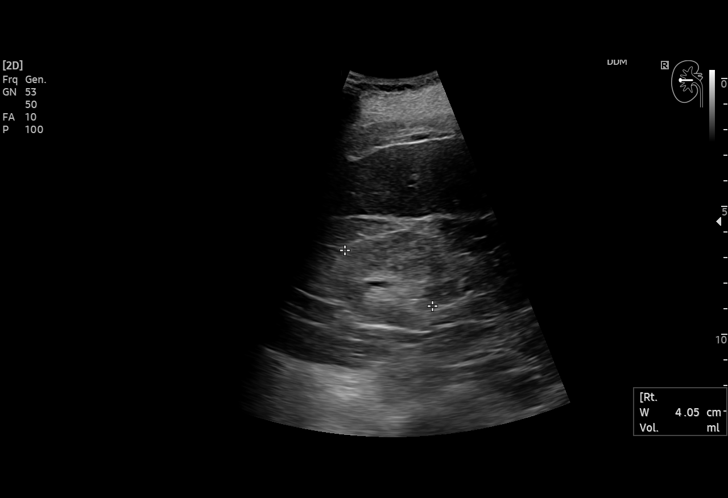
[im 24/48]
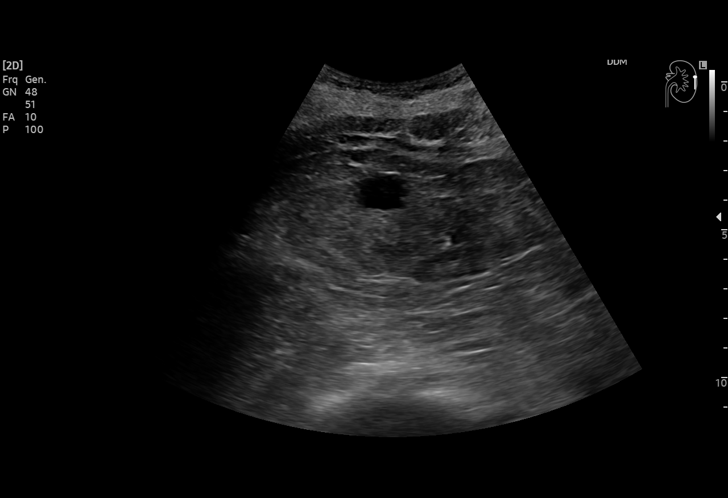
[im 28/48]
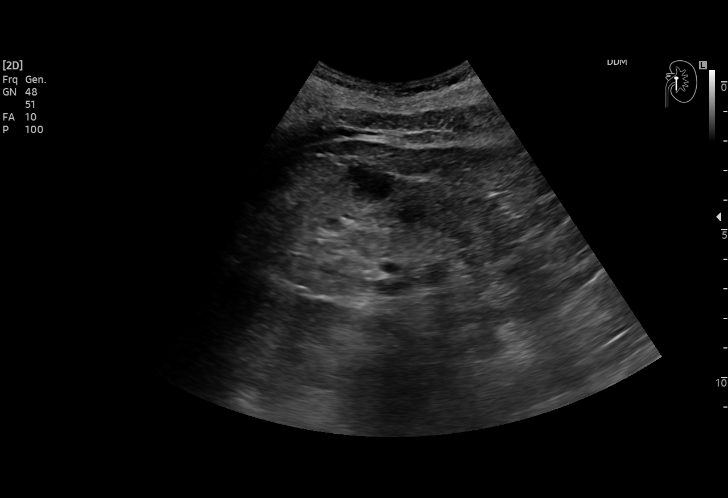
[im 30/48]
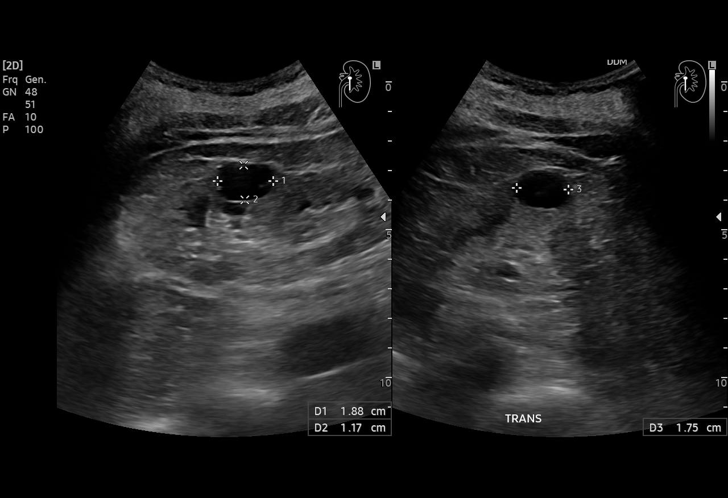
[im 34/48]
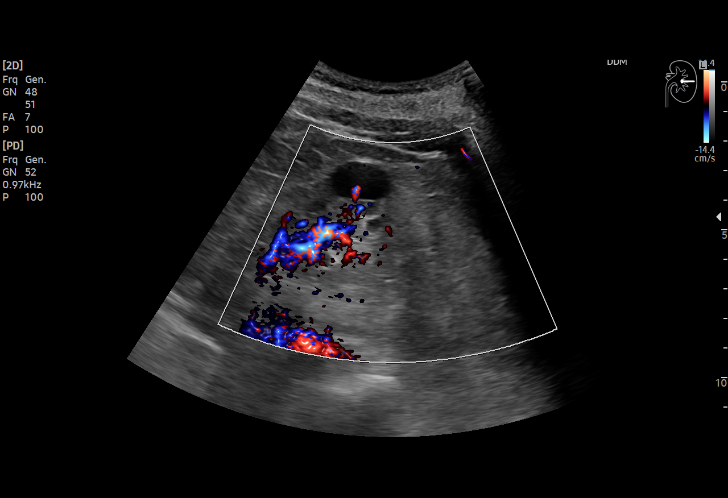
[im 38/48]
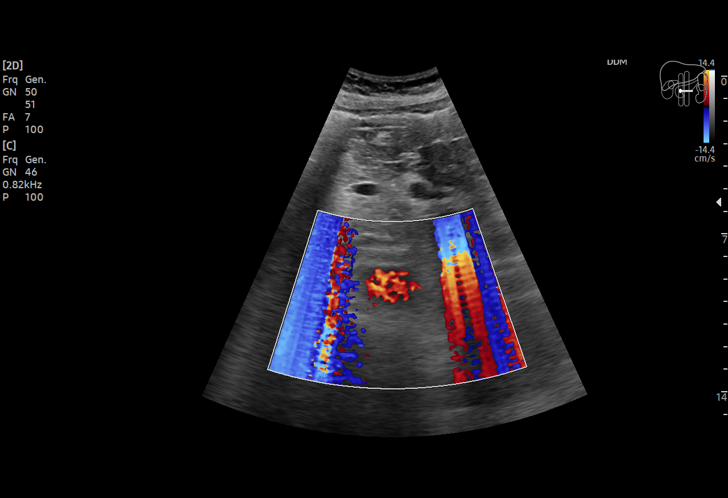
[im 40/48]
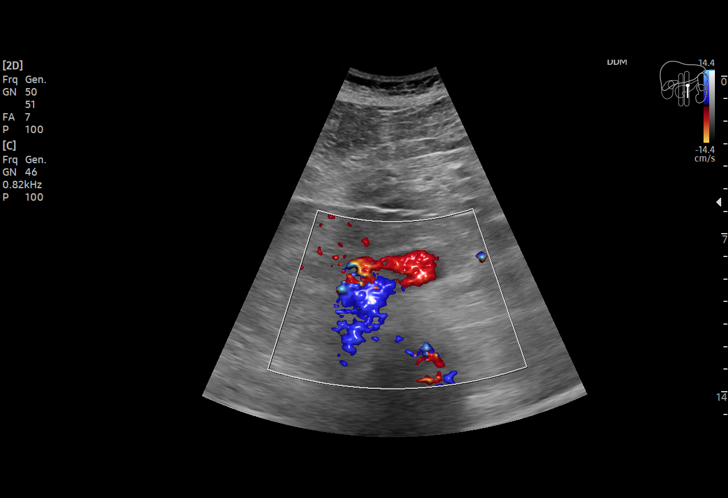
[im 44/48]
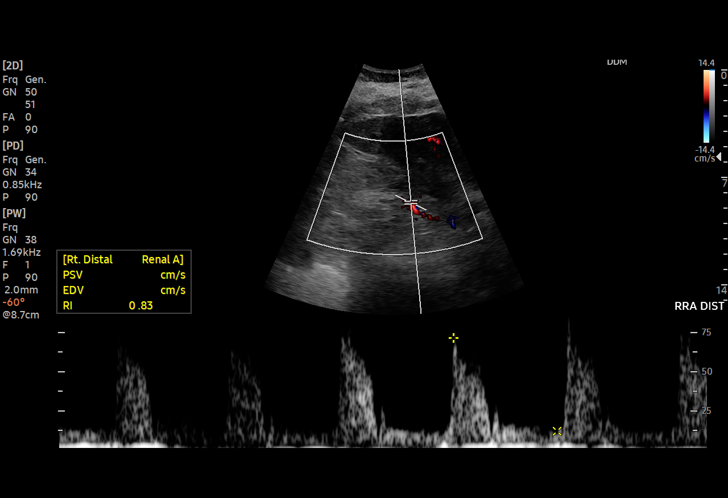
[im 48/48]
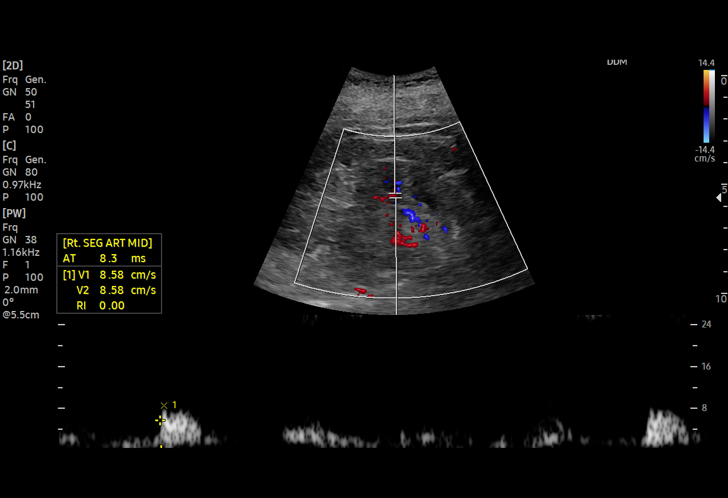

[15 of 25 positions shown; findings below may reference images not displayed]

FINDINGS: Right Kidney:

Length: 9.7 cm. Echogenicity within normal limits. No mass or
hydronephrosis visualized.

Left Kidney:

Length: 10.0 cm. Echogenicity within normal limits. No mass or
hydronephrosis visualized.

Bladder:  Unremarkable

RENAL DUPLEX ULTRASOUND

Right Renal Artery Velocities:

Origin:  54 cm/sec

Mid:  41 cm/sec

Hilum:  72 cm/sec

Interlobar:  10 cm/sec

Arcuate:  24 cm/sec

Left Renal Artery Velocities:

Origin:  21 cm/sec

Mid:  34 cm/sec

Hilum:  29 cm/sec

Interlobar:  17 cm/sec

Arcuate:  21 cm/sec

Aortic Velocity:  66 cm/sec

Right Renal-Aortic Ratios:

Origin:

Mid:

Hilum:

Interlobar:

Arcuate:

Left Renal-Aortic Ratios:

Origin:

Mid:

Hilum:

Interlobar:

Arcuate:

Anechoic cyst on the lateral cortex of the left kidney, 1.9 cm,
compatible with Bosniak 1 cyst.
IMPRESSION: Relatively symmetric appearance of the kidneys, with no
hydronephrosis.

Renal duplex negative for renal artery stenosis.

## 2020-10-08 IMAGING — US US RENAL ARTERY STENOSIS
1 series · 14 of 21 positions shown · non-contrast
Comparison: None.

CLINICAL DATA: 74-year-old female with a history chronic kidney
disease

EXAM:
RENAL/URINARY TRACT ULTRASOUND
RENAL DUPLEX DOPPLER ULTRASOUND

[Series 1: us renal artery duplex complete · 14 of 21 slices shown]
[im 1/21]
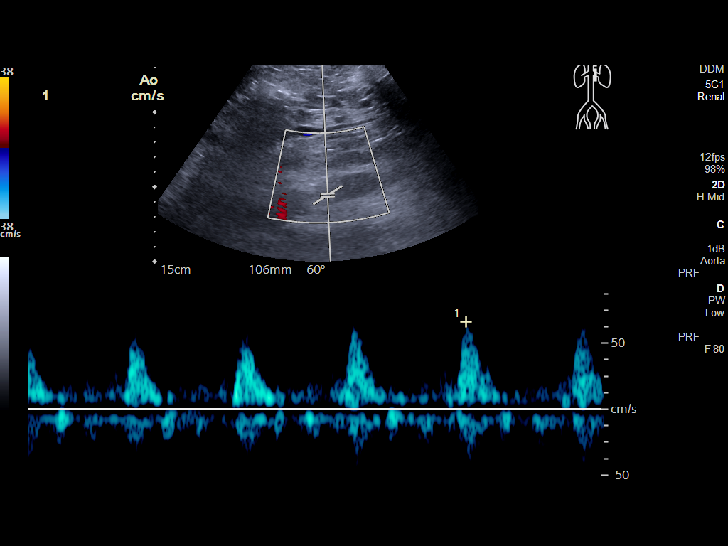
[im 3/21]
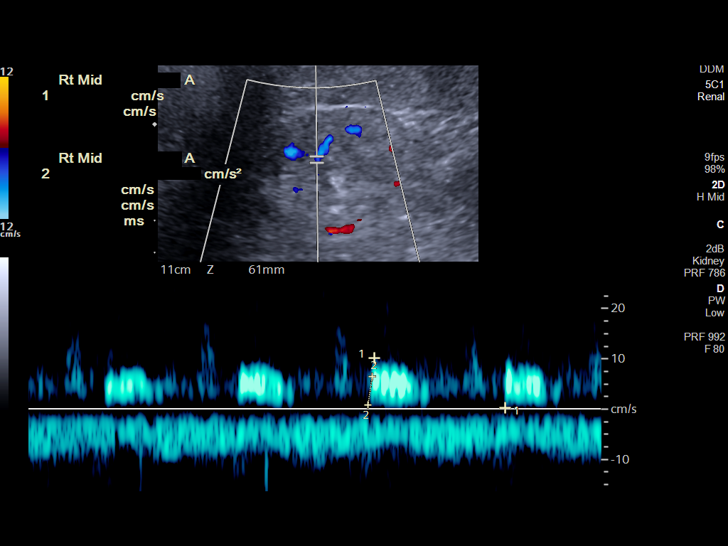
[im 4/21]
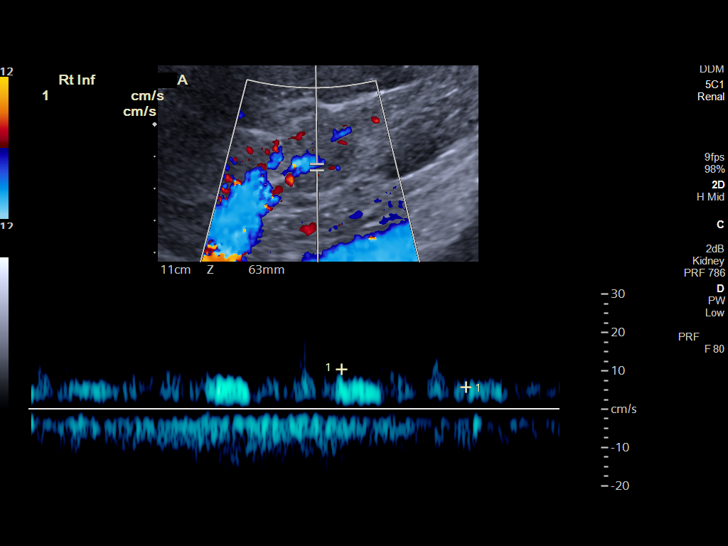
[im 6/21]
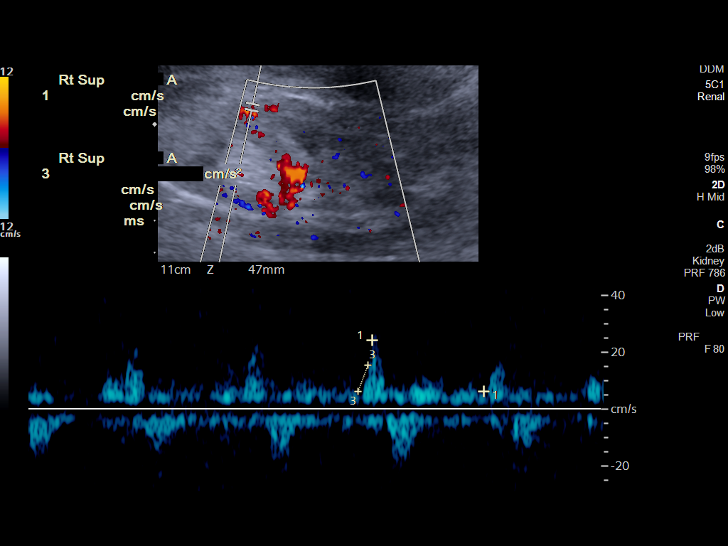
[im 7/21]
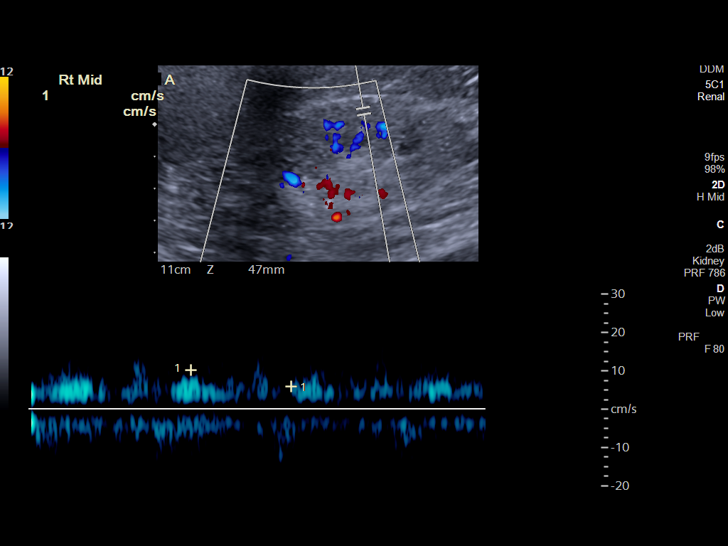
[im 9/21]
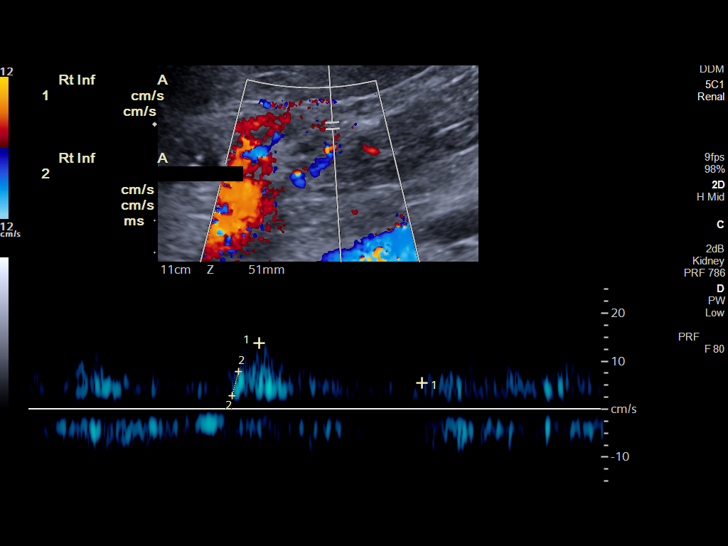
[im 10/21]
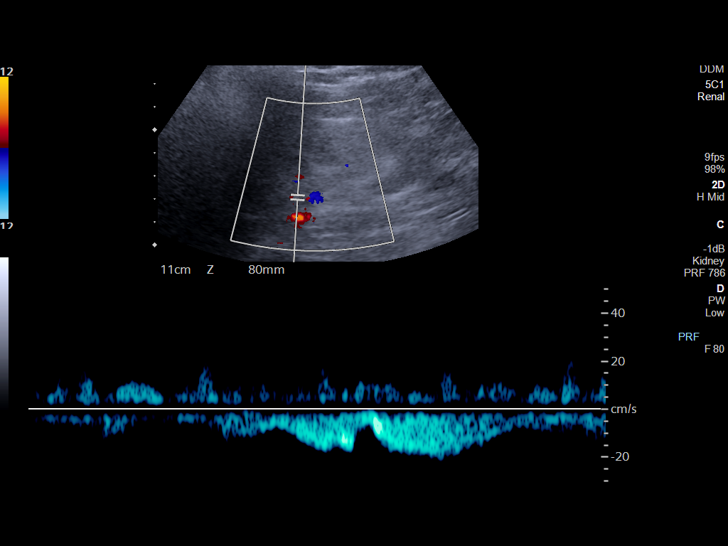
[im 12/21]
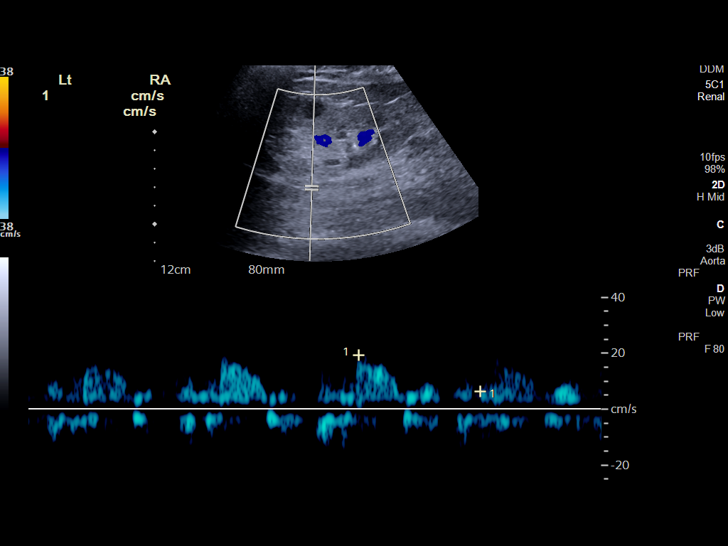
[im 13/21]
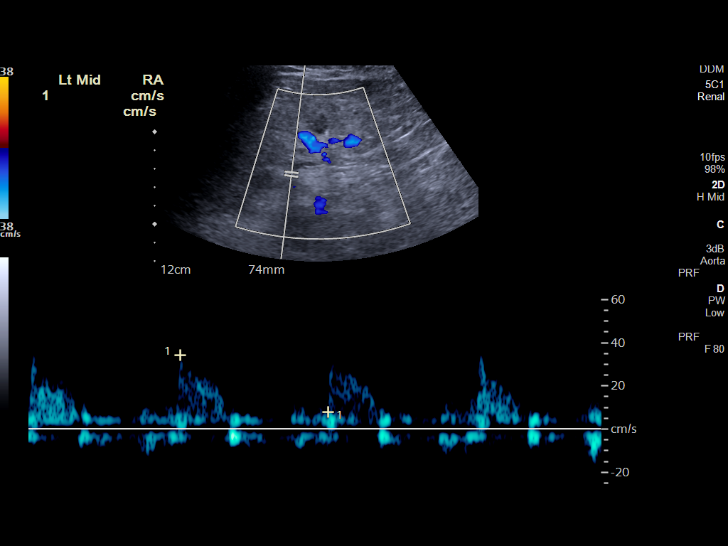
[im 15/21]
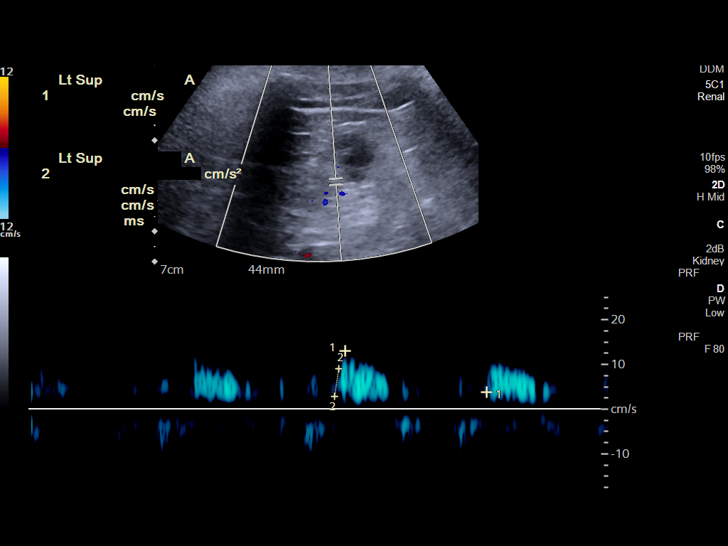
[im 16/21]
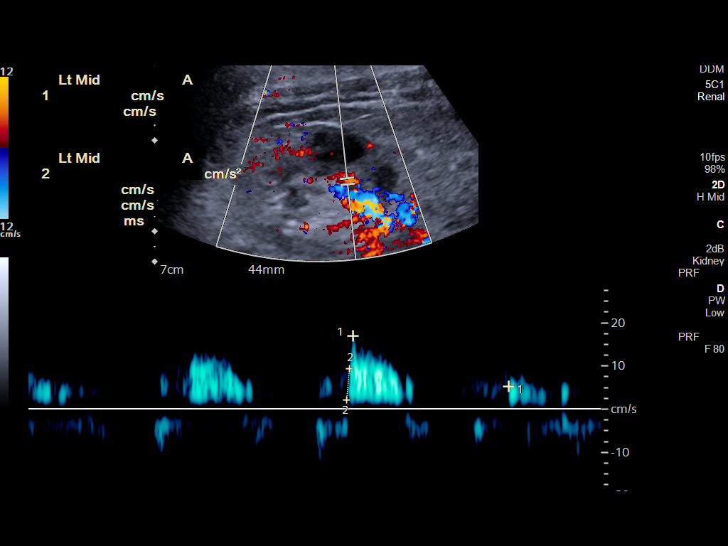
[im 18/21]
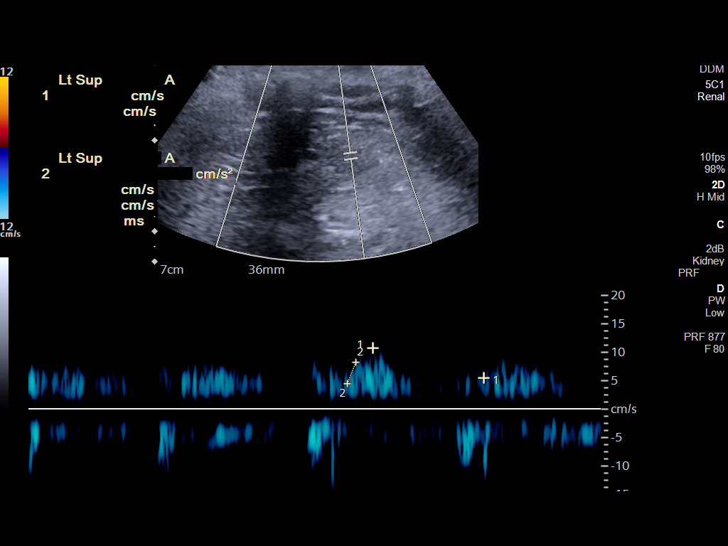
[im 19/21]
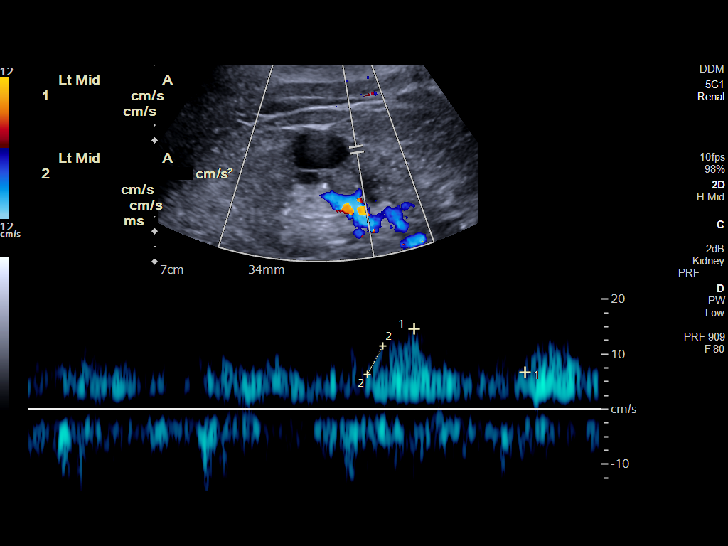
[im 21/21]
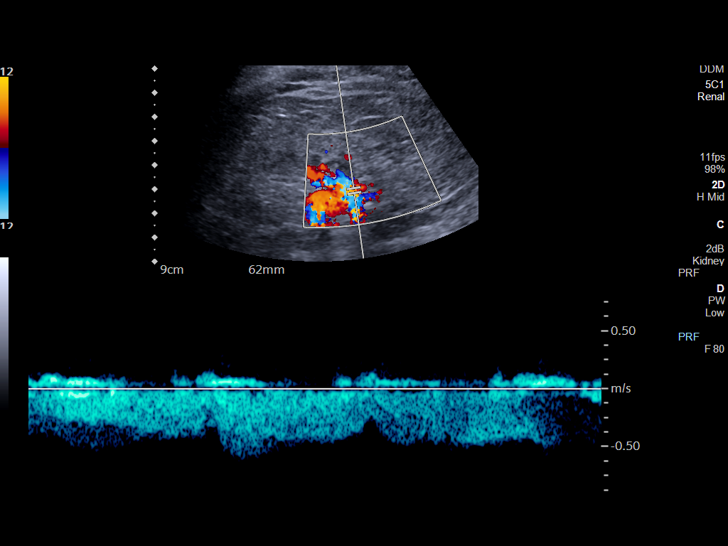

[14 of 21 positions shown; findings below may reference images not displayed]

FINDINGS: Right Kidney:

Length: 9.7 cm. Echogenicity within normal limits. No mass or
hydronephrosis visualized.

Left Kidney:

Length: 10.0 cm. Echogenicity within normal limits. No mass or
hydronephrosis visualized.

Bladder:  Unremarkable

RENAL DUPLEX ULTRASOUND

Right Renal Artery Velocities:

Origin:  54 cm/sec

Mid:  41 cm/sec

Hilum:  72 cm/sec

Interlobar:  10 cm/sec

Arcuate:  24 cm/sec

Left Renal Artery Velocities:

Origin:  21 cm/sec

Mid:  34 cm/sec

Hilum:  29 cm/sec

Interlobar:  17 cm/sec

Arcuate:  21 cm/sec

Aortic Velocity:  66 cm/sec

Right Renal-Aortic Ratios:

Origin:

Mid:

Hilum:

Interlobar:

Arcuate:

Left Renal-Aortic Ratios:

Origin:

Mid:

Hilum:

Interlobar:

Arcuate:

Anechoic cyst on the lateral cortex of the left kidney, 1.9 cm,
compatible with Bosniak 1 cyst.
IMPRESSION: Relatively symmetric appearance of the kidneys, with no
hydronephrosis.

Renal duplex negative for renal artery stenosis.

## 2021-01-17 ENCOUNTER — Emergency Department: Payer: Medicare (Managed Care)

## 2021-01-17 ENCOUNTER — Encounter: Payer: Self-pay | Admitting: Internal Medicine

## 2021-01-17 ENCOUNTER — Inpatient Hospital Stay
Admission: EM | Admit: 2021-01-17 | Discharge: 2021-01-21 | DRG: 682 | Disposition: A | Discharge status: Home / Self Care | Payer: Medicare (Managed Care) | Attending: Internal Medicine | Admitting: Internal Medicine

## 2021-01-17 ENCOUNTER — Other Ambulatory Visit: Payer: Self-pay

## 2021-01-17 ENCOUNTER — Observation Stay: Payer: Medicare (Managed Care)

## 2021-01-17 DIAGNOSIS — N179 Acute kidney failure, unspecified: Secondary | ICD-10-CM | POA: Diagnosis not present

## 2021-01-17 DIAGNOSIS — E039 Hypothyroidism, unspecified: Secondary | ICD-10-CM | POA: Diagnosis present

## 2021-01-17 DIAGNOSIS — M7989 Other specified soft tissue disorders: Secondary | ICD-10-CM | POA: Diagnosis present

## 2021-01-17 DIAGNOSIS — E1122 Type 2 diabetes mellitus with diabetic chronic kidney disease: Secondary | ICD-10-CM | POA: Diagnosis present

## 2021-01-17 DIAGNOSIS — Z888 Allergy status to other drugs, medicaments and biological substances status: Secondary | ICD-10-CM

## 2021-01-17 DIAGNOSIS — Z79890 Hormone replacement therapy (postmenopausal): Secondary | ICD-10-CM

## 2021-01-17 DIAGNOSIS — N189 Chronic kidney disease, unspecified: Secondary | ICD-10-CM | POA: Diagnosis present

## 2021-01-17 DIAGNOSIS — E1129 Type 2 diabetes mellitus with other diabetic kidney complication: Secondary | ICD-10-CM | POA: Diagnosis present

## 2021-01-17 DIAGNOSIS — Z7984 Long term (current) use of oral hypoglycemic drugs: Secondary | ICD-10-CM

## 2021-01-17 DIAGNOSIS — N1832 Chronic kidney disease, stage 3b: Secondary | ICD-10-CM | POA: Diagnosis not present

## 2021-01-17 DIAGNOSIS — E785 Hyperlipidemia, unspecified: Secondary | ICD-10-CM | POA: Diagnosis present

## 2021-01-17 DIAGNOSIS — H409 Unspecified glaucoma: Secondary | ICD-10-CM | POA: Diagnosis present

## 2021-01-17 DIAGNOSIS — D631 Anemia in chronic kidney disease: Secondary | ICD-10-CM | POA: Diagnosis present

## 2021-01-17 DIAGNOSIS — Z7983 Long term (current) use of bisphosphonates: Secondary | ICD-10-CM

## 2021-01-17 DIAGNOSIS — T17908A Unspecified foreign body in respiratory tract, part unspecified causing other injury, initial encounter: Secondary | ICD-10-CM

## 2021-01-17 DIAGNOSIS — I639 Cerebral infarction, unspecified: Secondary | ICD-10-CM | POA: Diagnosis present

## 2021-01-17 DIAGNOSIS — W050XXA Fall from non-moving wheelchair, initial encounter: Secondary | ICD-10-CM | POA: Diagnosis present

## 2021-01-17 DIAGNOSIS — Z79899 Other long term (current) drug therapy: Secondary | ICD-10-CM

## 2021-01-17 DIAGNOSIS — E11649 Type 2 diabetes mellitus with hypoglycemia without coma: Secondary | ICD-10-CM | POA: Diagnosis not present

## 2021-01-17 DIAGNOSIS — I120 Hypertensive chronic kidney disease with stage 5 chronic kidney disease or end stage renal disease: Secondary | ICD-10-CM | POA: Diagnosis present

## 2021-01-17 DIAGNOSIS — E872 Acidosis: Secondary | ICD-10-CM | POA: Diagnosis present

## 2021-01-17 DIAGNOSIS — W19XXXA Unspecified fall, initial encounter: Secondary | ICD-10-CM | POA: Diagnosis not present

## 2021-01-17 DIAGNOSIS — Z993 Dependence on wheelchair: Secondary | ICD-10-CM

## 2021-01-17 DIAGNOSIS — R569 Unspecified convulsions: Secondary | ICD-10-CM | POA: Diagnosis present

## 2021-01-17 DIAGNOSIS — I69354 Hemiplegia and hemiparesis following cerebral infarction affecting left non-dominant side: Secondary | ICD-10-CM

## 2021-01-17 DIAGNOSIS — Z170 Estrogen receptor positive status [ER+]: Secondary | ICD-10-CM

## 2021-01-17 DIAGNOSIS — Z79811 Long term (current) use of aromatase inhibitors: Secondary | ICD-10-CM

## 2021-01-17 DIAGNOSIS — N3281 Overactive bladder: Secondary | ICD-10-CM | POA: Diagnosis present

## 2021-01-17 DIAGNOSIS — Z7982 Long term (current) use of aspirin: Secondary | ICD-10-CM

## 2021-01-17 DIAGNOSIS — Z90710 Acquired absence of both cervix and uterus: Secondary | ICD-10-CM

## 2021-01-17 DIAGNOSIS — N185 Chronic kidney disease, stage 5: Secondary | ICD-10-CM | POA: Diagnosis present

## 2021-01-17 DIAGNOSIS — F419 Anxiety disorder, unspecified: Secondary | ICD-10-CM | POA: Diagnosis present

## 2021-01-17 DIAGNOSIS — S0990XA Unspecified injury of head, initial encounter: Secondary | ICD-10-CM

## 2021-01-17 DIAGNOSIS — Z20822 Contact with and (suspected) exposure to covid-19: Secondary | ICD-10-CM | POA: Diagnosis present

## 2021-01-17 DIAGNOSIS — E860 Dehydration: Secondary | ICD-10-CM | POA: Diagnosis present

## 2021-01-17 DIAGNOSIS — G9341 Metabolic encephalopathy: Secondary | ICD-10-CM | POA: Diagnosis present

## 2021-01-17 DIAGNOSIS — I10 Essential (primary) hypertension: Secondary | ICD-10-CM | POA: Diagnosis not present

## 2021-01-17 DIAGNOSIS — R4701 Aphasia: Secondary | ICD-10-CM | POA: Diagnosis present

## 2021-01-17 DIAGNOSIS — C50411 Malignant neoplasm of upper-outer quadrant of right female breast: Secondary | ICD-10-CM | POA: Diagnosis present

## 2021-01-17 DIAGNOSIS — K219 Gastro-esophageal reflux disease without esophagitis: Secondary | ICD-10-CM | POA: Insufficient documentation

## 2021-01-17 DIAGNOSIS — N186 End stage renal disease: Secondary | ICD-10-CM | POA: Diagnosis present

## 2021-01-17 LAB — URINALYSIS, COMPLETE (UACMP) WITH MICROSCOPIC
Bacteria, UA: NONE SEEN
Bilirubin Urine: NEGATIVE
Glucose, UA: NEGATIVE mg/dL
Ketones, ur: NEGATIVE mg/dL
Leukocytes,Ua: NEGATIVE
Nitrite: NEGATIVE
Protein, ur: 100 mg/dL — AB
Specific Gravity, Urine: 1.009 (ref 1.005–1.030)
pH: 6 (ref 5.0–8.0)

## 2021-01-17 LAB — CBC WITH DIFFERENTIAL/PLATELET
Abs Immature Granulocytes: 0.09 10*3/uL — ABNORMAL HIGH (ref 0.00–0.07)
Basophils Absolute: 0 10*3/uL (ref 0.0–0.1)
Basophils Relative: 0 %
Eosinophils Absolute: 0.2 10*3/uL (ref 0.0–0.5)
Eosinophils Relative: 2 %
HCT: 29.3 % — ABNORMAL LOW (ref 36.0–46.0)
Hemoglobin: 9.3 g/dL — ABNORMAL LOW (ref 12.0–15.0)
Immature Granulocytes: 1 %
Lymphocytes Relative: 17 %
Lymphs Abs: 1.8 10*3/uL (ref 0.7–4.0)
MCH: 29 pg (ref 26.0–34.0)
MCHC: 31.7 g/dL (ref 30.0–36.0)
MCV: 91.3 fL (ref 80.0–100.0)
Monocytes Absolute: 0.6 10*3/uL (ref 0.1–1.0)
Monocytes Relative: 6 %
Neutro Abs: 7.7 10*3/uL (ref 1.7–7.7)
Neutrophils Relative %: 74 %
Platelets: 290 10*3/uL (ref 150–400)
RBC: 3.21 MIL/uL — ABNORMAL LOW (ref 3.87–5.11)
RDW: 14.6 % (ref 11.5–15.5)
WBC: 10.3 10*3/uL (ref 4.0–10.5)
nRBC: 0 % (ref 0.0–0.2)

## 2021-01-17 LAB — COMPREHENSIVE METABOLIC PANEL
ALT: 10 U/L (ref 0–44)
AST: 19 U/L (ref 15–41)
Albumin: 3.2 g/dL — ABNORMAL LOW (ref 3.5–5.0)
Alkaline Phosphatase: 70 U/L (ref 38–126)
Anion gap: 15 (ref 5–15)
BUN: 38 mg/dL — ABNORMAL HIGH (ref 8–23)
CO2: 19 mmol/L — ABNORMAL LOW (ref 22–32)
Calcium: 8.7 mg/dL — ABNORMAL LOW (ref 8.9–10.3)
Chloride: 108 mmol/L (ref 98–111)
Creatinine, Ser: 4.72 mg/dL — ABNORMAL HIGH (ref 0.44–1.00)
GFR, Estimated: 9 mL/min — ABNORMAL LOW (ref >60)
Glucose, Bld: 108 mg/dL — ABNORMAL HIGH (ref 70–99)
Potassium: 4 mmol/L (ref 3.5–5.1)
Sodium: 142 mmol/L (ref 135–145)
Total Bilirubin: 0.6 mg/dL (ref 0.3–1.2)
Total Protein: 7.9 g/dL (ref 6.5–8.1)

## 2021-01-17 LAB — GLUCOSE, CAPILLARY: Glucose-Capillary: 109 mg/dL — ABNORMAL HIGH (ref 70–99)

## 2021-01-17 LAB — TROPONIN I (HIGH SENSITIVITY): Troponin I (High Sensitivity): 7 ng/L (ref <18)

## 2021-01-17 LAB — CBG MONITORING, ED: Glucose-Capillary: 158 mg/dL — ABNORMAL HIGH (ref 70–99)

## 2021-01-17 IMAGING — CT CT CERVICAL SPINE W/O CM
3 of 4 series · 11 of 35 positions shown, 13 images · non-contrast
Comparison: None.

CLINICAL DATA: Fell.  Hit head.

EXAM:
CT HEAD WITHOUT CONTRAST
CT CERVICAL SPINE WITHOUT CONTRAST
TECHNIQUE: Multidetector CT imaging of the head and cervical spine was
performed following the standard protocol without intravenous
contrast. Multiplanar CT image reconstructions of the cervical spine
were also generated.

[Series 5: orthogonal axials · axial · 0.29mm/px · z∈[-246,-148]mm · 3 of 111 slices shown, 4 images]
[im 28/111  soft-tissue]
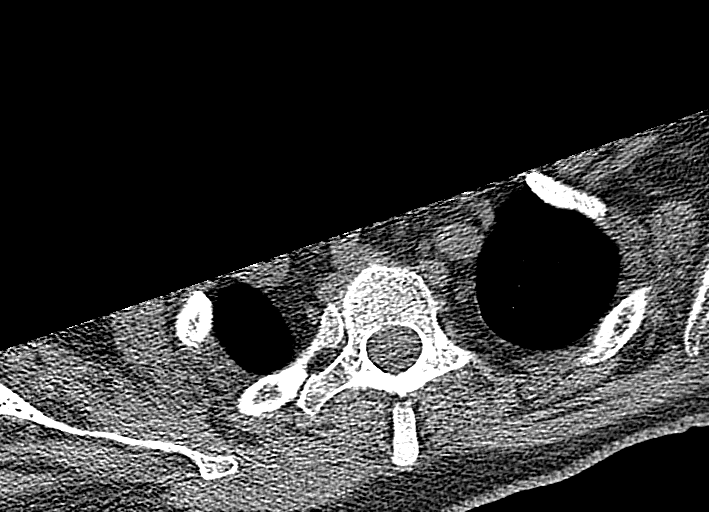
[im 28/111  bone]
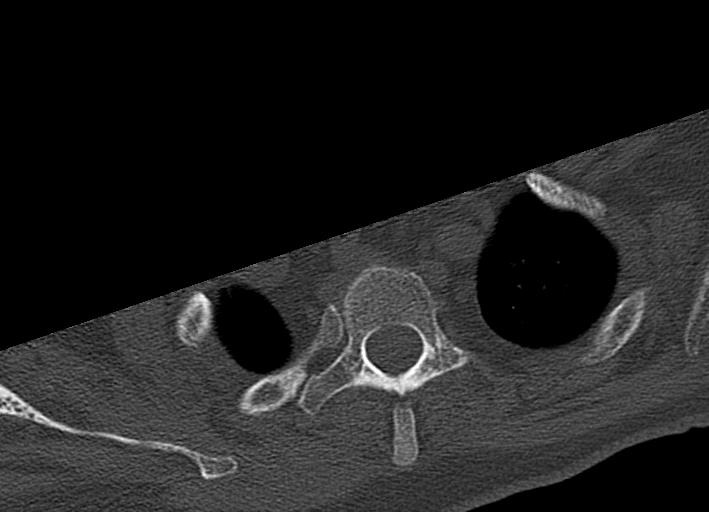
[im 56/111  bone]
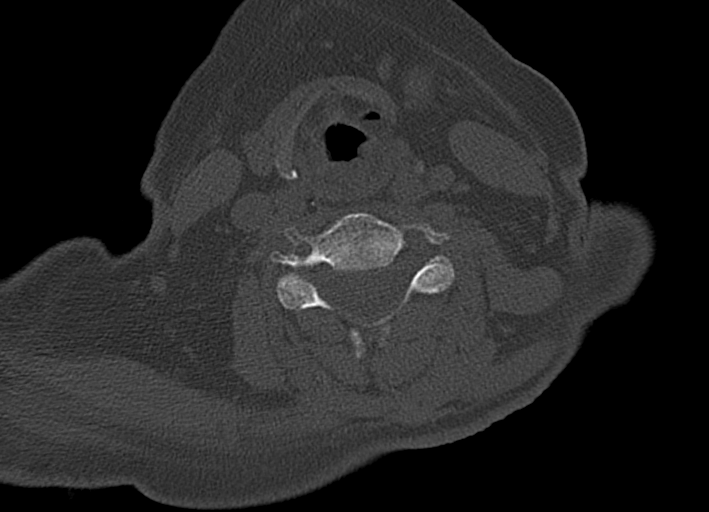
[im 83/111  bone]
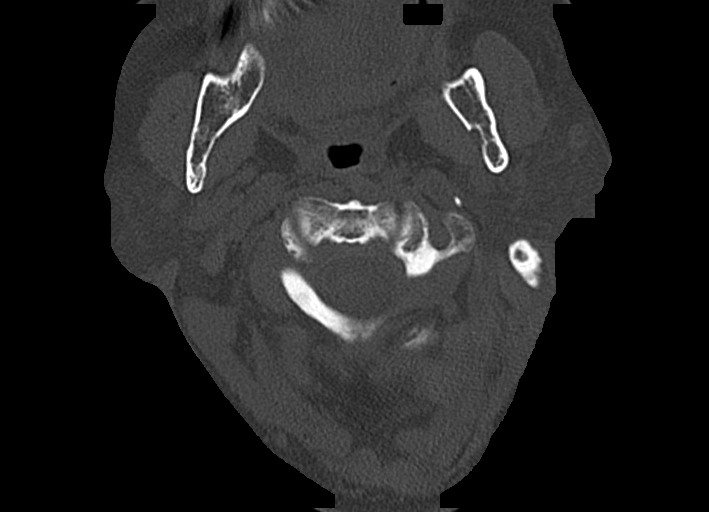

[Series 6: sagittal bone · sagittal · 0.29mm/px · 5 of 38 slices shown, 6 images]
[im 13/38  bone]
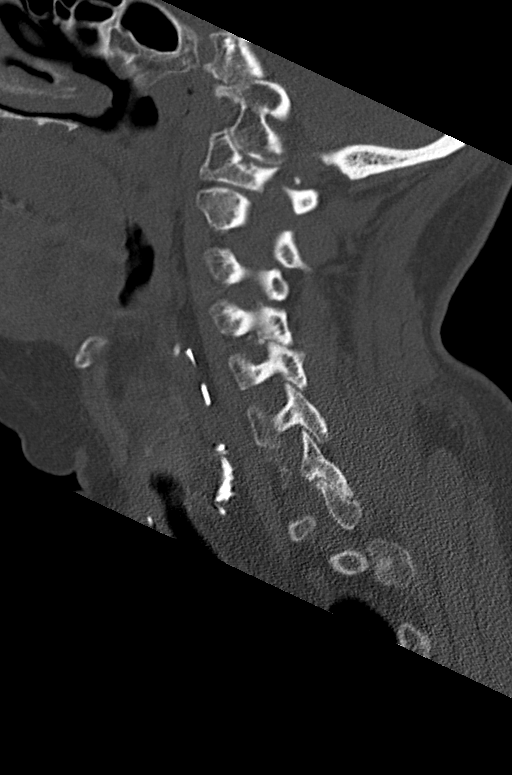
[im 16/38  bone]
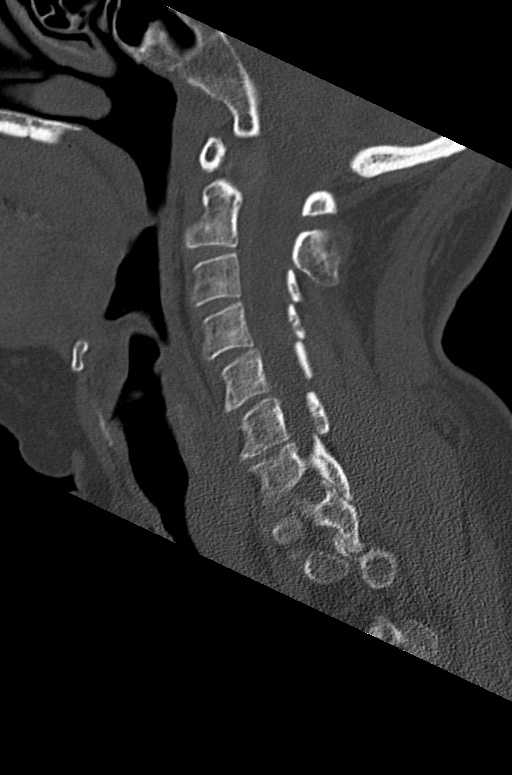
[im 19/38  soft-tissue]
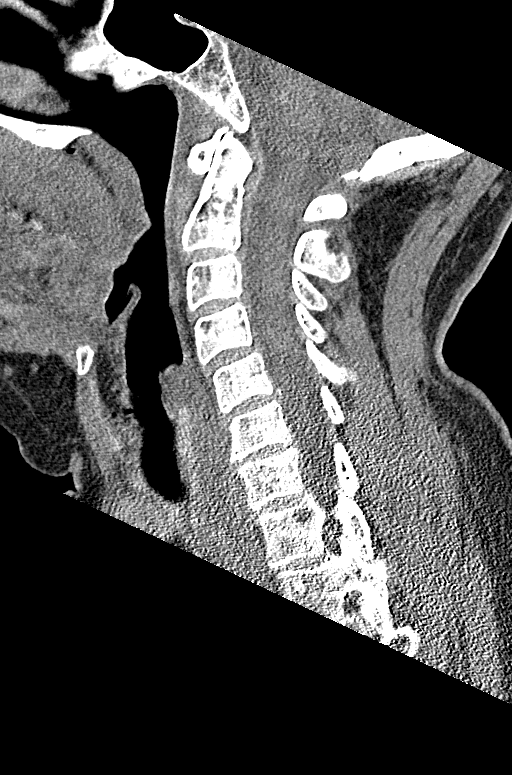
[im 19/38  bone]
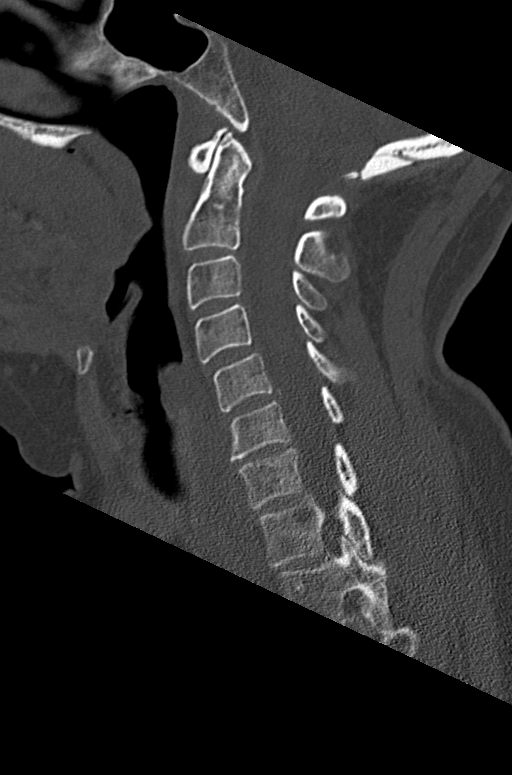
[im 22/38  bone]
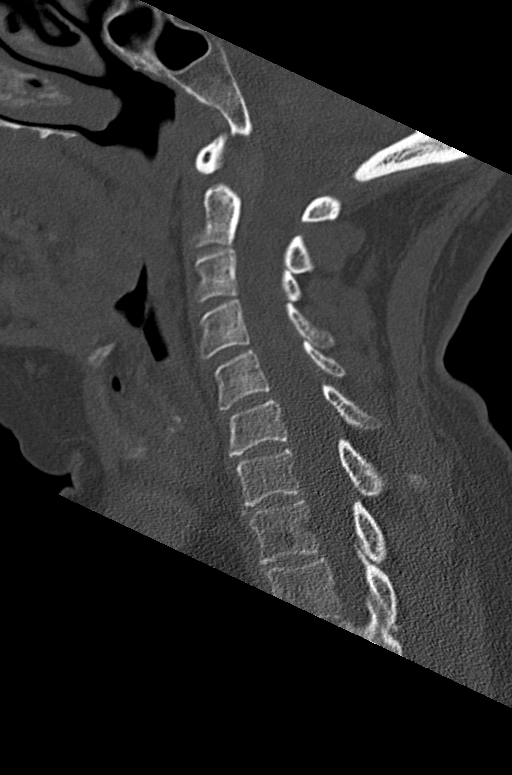
[im 25/38  bone]
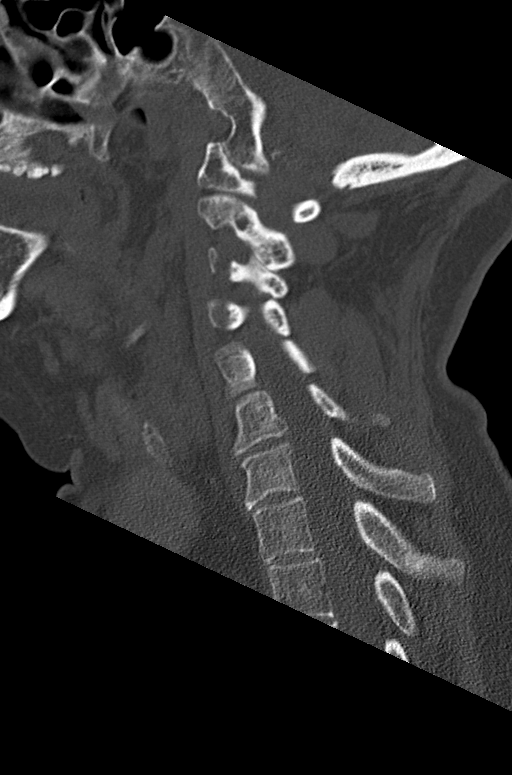

[Series 7: coronal bone · coronal · 0.31mm/px · 3 of 57 slices shown]
[im 12/57  bone]
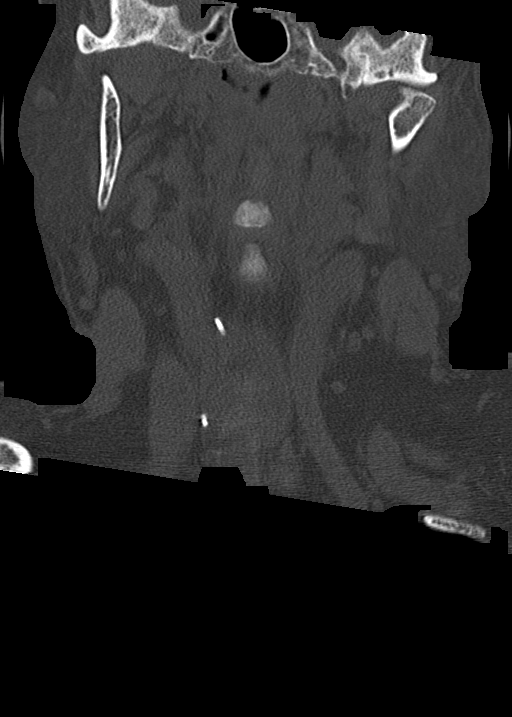
[im 23/57  bone]
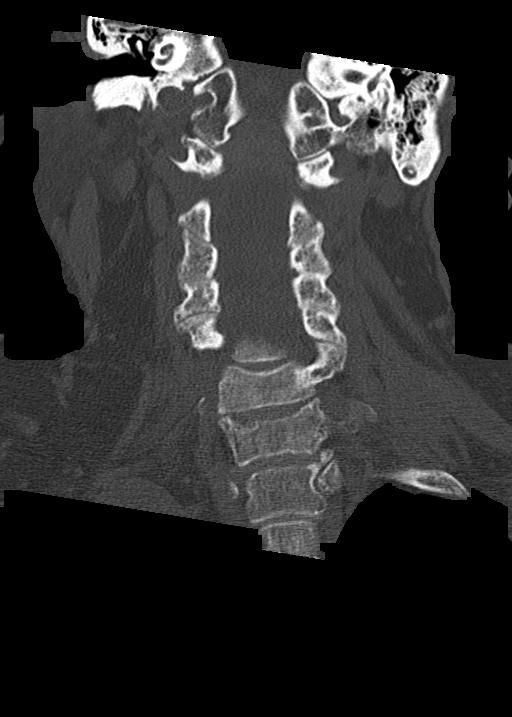
[im 34/57  bone]
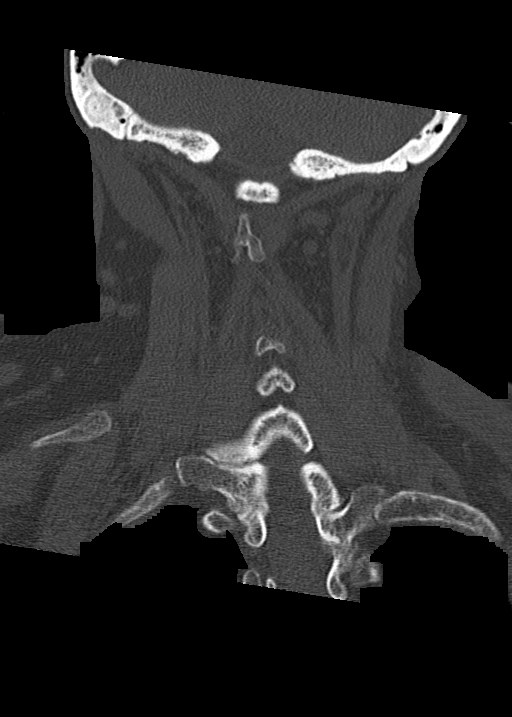

[11 of 35 positions shown; findings below may reference images not displayed]

FINDINGS: CT HEAD FINDINGS

Brain: Large craniectomy defect involving the right parietal lobe.
There is underlying extensive encephalomalacia in the right parietal
lobe. This could be postoperative or posttraumatic change. The
ventricles are in the midline without mass effect or shift. Mild ex
vacuo dilatation of the right lateral ventricle. No acute
extra-axial fluid collections are identified. No CT findings to
suggest an acute hemispheric infarction or intracranial hemorrhage.
Fairly extensive periventricular white matter disease and moderate
to advanced cerebral atrophy.

The brainstem and cerebellum are grossly normal.  Mild atrophy.

Vascular: Vascular calcifications but no aneurysm or hyperdense
vessels.

Skull: Right craniectomy defect but no acute fracture or bone
lesions.

Sinuses/Orbits: The paranasal sinuses and mastoid air cells are
clear. The globes are intact.

Other: Significant left periorbital hematoma.

CT CERVICAL SPINE FINDINGS

Alignment: Normal

Skull base and vertebrae: No acute fracture. No primary bone lesion
or focal pathologic process.

Soft tissues and spinal canal: No prevertebral fluid or swelling. No
visible canal hematoma.

Disc levels: The spinal canal is quite generous. No large disc
protrusions, spinal or foraminal stenosis.

Upper chest: The lung apices are grossly clear.

Other: Evidence of prior right-sided thyroidectomy. The left thyroid
lobe is still in place. Moderate nodularity with a 13 mm nodule
noted. Not clinically significant; no follow-up imaging recommended
(ref: [HOSPITAL]. [DATE]): 143-50).
IMPRESSION: 1. Large craniectomy defect involving the right parietal lobe with
underlying extensive encephalomalacia in the right parietal lobe.
This could be postoperative or posttraumatic change.
2. No acute intracranial findings or skull fracture.
3. Significant left periorbital hematoma.
4. Normal alignment of the cervical vertebral bodies and no acute
cervical spine fracture.

## 2021-01-17 IMAGING — MR MR HEAD W/O CM
9 of 10 series · 41 of 48 positions shown · non-contrast
Comparison: Head CT earlier same day.

CLINICAL DATA: Hypertension and diabetes. Left-sided weakness. Fell
today.

EXAM:
MRI HEAD WITHOUT CONTRAST
TECHNIQUE: Multiplanar, multiecho pulse sequences of the brain and surrounding
structures were obtained without intravenous contrast.

[Series 11: ax dwi_tracew · axial · 3.0mm · 0.71mm/px · z∈[+22,+168]mm · 6 of 50 slices shown]
[im 1/50]
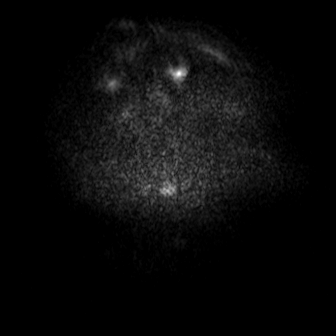
[im 10/50]
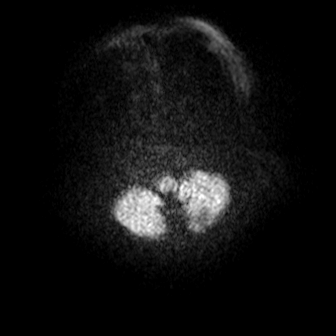
[im 20/50]
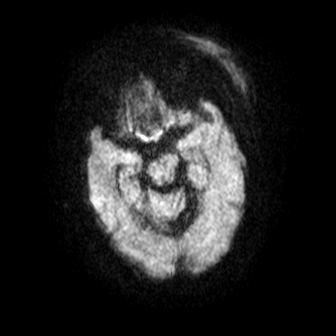
[im 30/50]
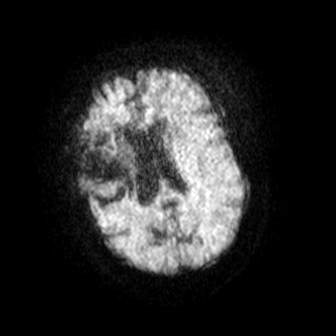
[im 40/50]
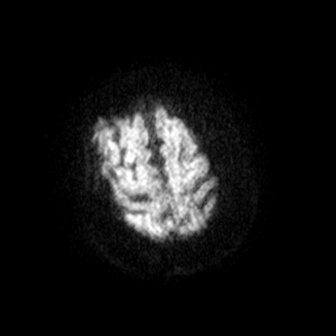
[im 50/50]
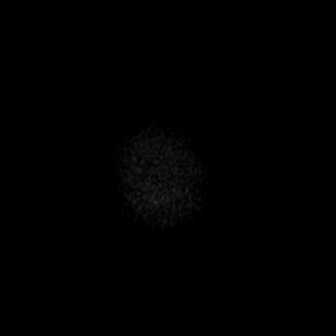

[Series 12: ax dwi_adc · axial · 3.0mm · 0.71mm/px · z∈[+22,+168]mm · 6 of 50 slices shown]
[im 1/50]
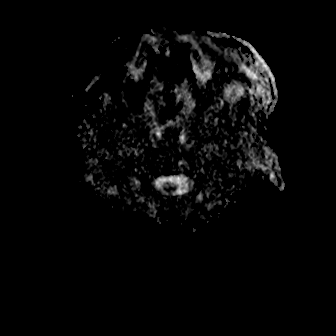
[im 10/50]
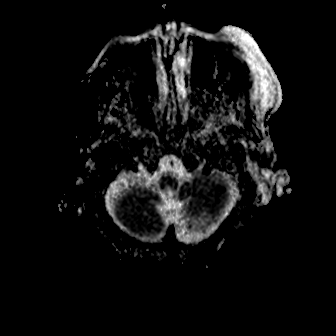
[im 20/50]
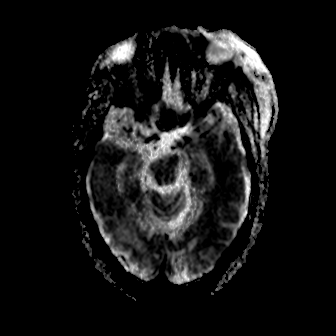
[im 30/50]
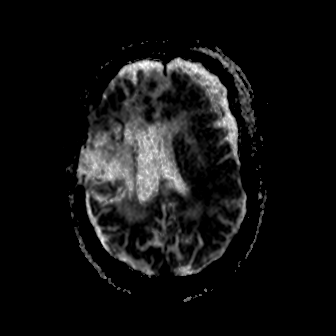
[im 40/50]
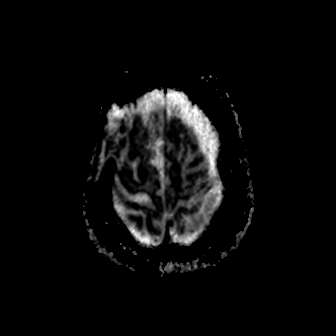
[im 50/50]
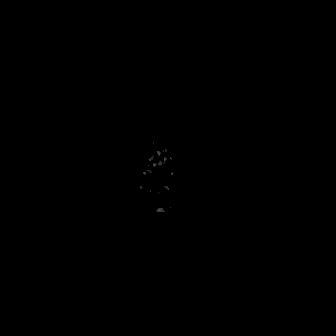

[Series 15: cor dwi_tracew · coronal · 5.0mm · 0.68mm/px · 6 of 38 slices shown]
[im 1/38]
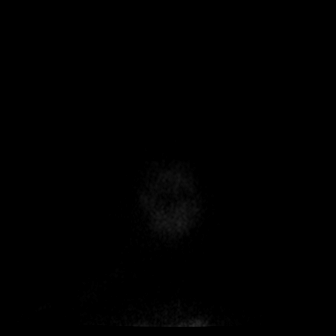
[im 8/38]
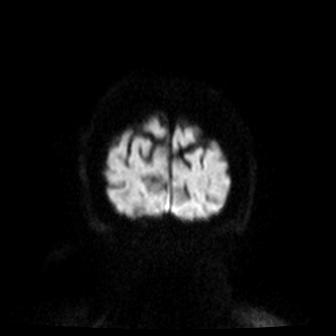
[im 15/38]
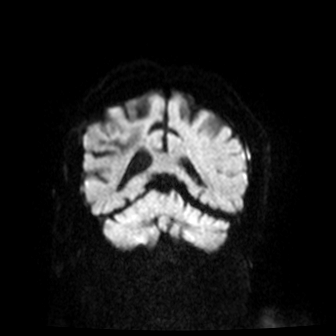
[im 23/38]
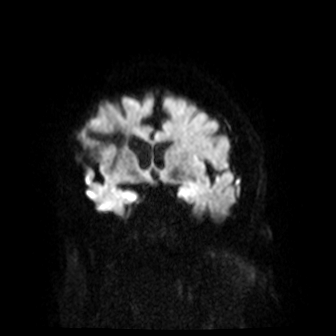
[im 30/38]
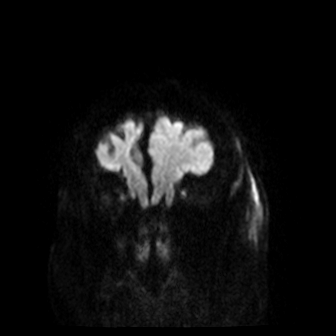
[im 38/38]
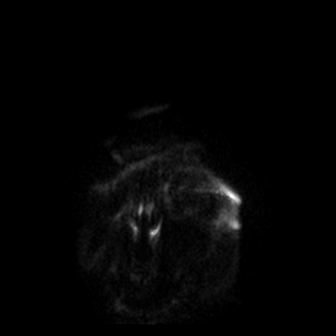

[Series 16: cor dwi_adc · coronal · 5.0mm · 0.68mm/px · 3 of 38 slices shown]
[im 1/38]
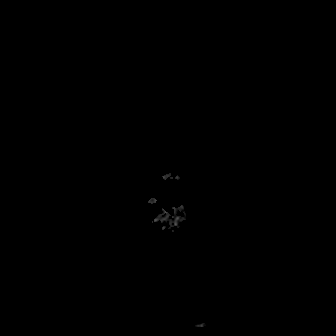
[im 8/38]
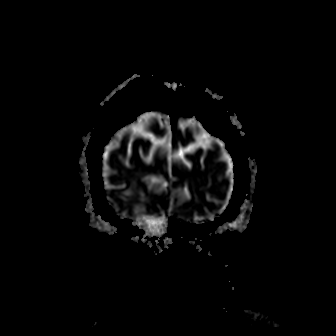
[im 15/38]
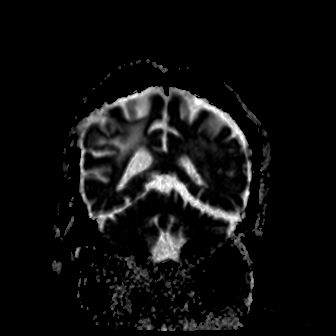

[Series 17: T1 · sagittal · 5.0mm · 0.94mm/px · 3 of 21 slices shown (1 of 2)]
[im 1/21]
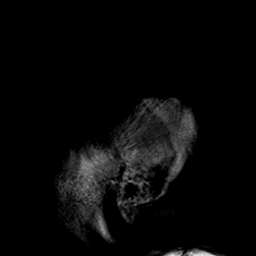
[im 11/21]
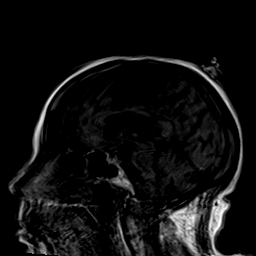
[im 21/21]
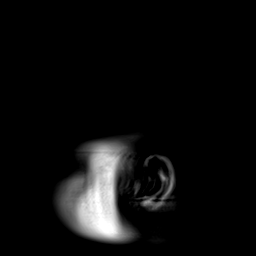

[Series 18: T2 · axial · 5.0mm · 0.45mm/px · z∈[+21,+164]mm · 4 of 25 slices shown (1 of 2)]
[im 1/25]
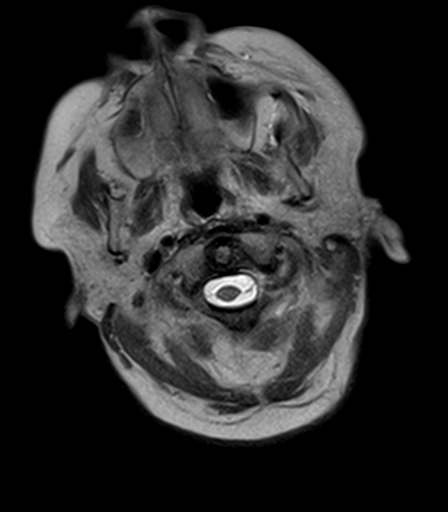
[im 9/25]
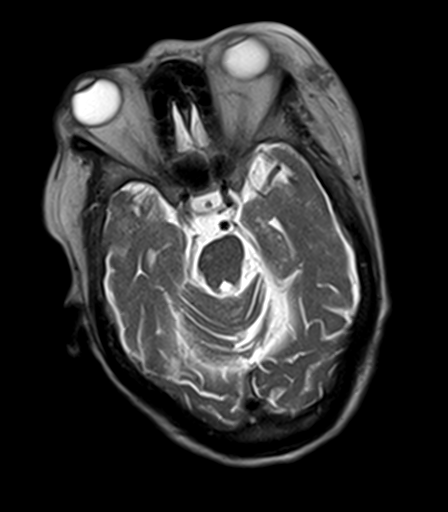
[im 17/25]
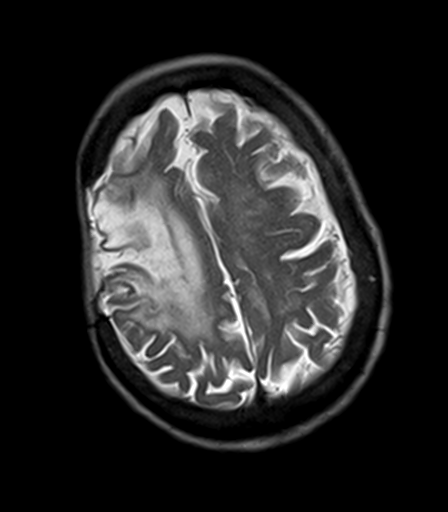
[im 25/25]
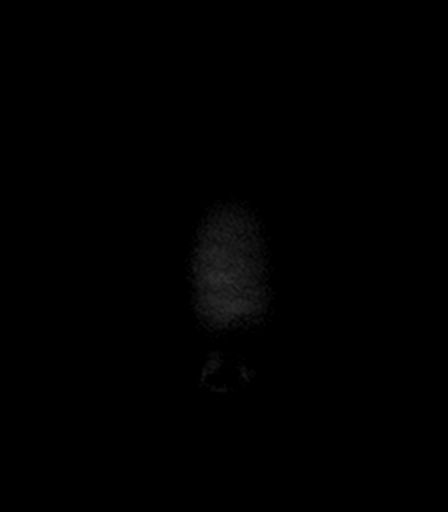

[Series 20: FLAIR · axial · 5.0mm · 1.20mm/px · z∈[+22,+164]mm · 4 of 25 slices shown]
[im 1/25]
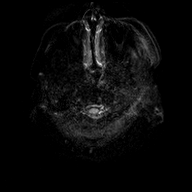
[im 9/25]
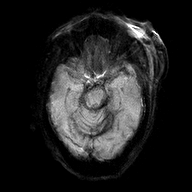
[im 17/25]
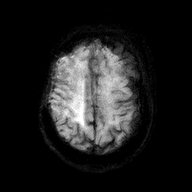
[im 25/25]
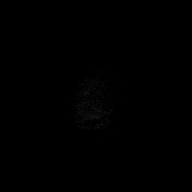

[Series 21: T1 · axial · 5.0mm · 0.90mm/px · z∈[+21,+164]mm · 4 of 25 slices shown (2 of 2)]
[im 1/25]
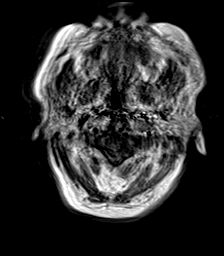
[im 9/25]
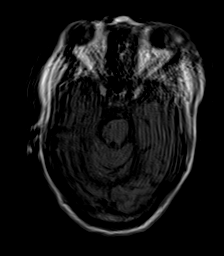
[im 17/25]
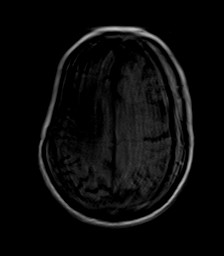
[im 25/25]
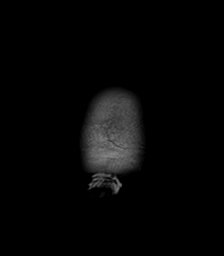

[Series 22: T2 · coronal · 5.0mm · 0.45mm/px · 5 of 31 slices shown (2 of 2)]
[im 1/31]
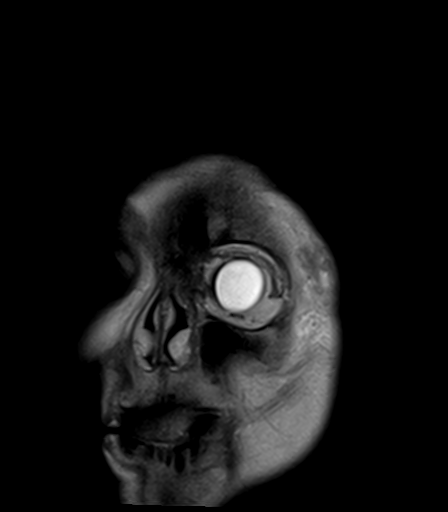
[im 8/31]
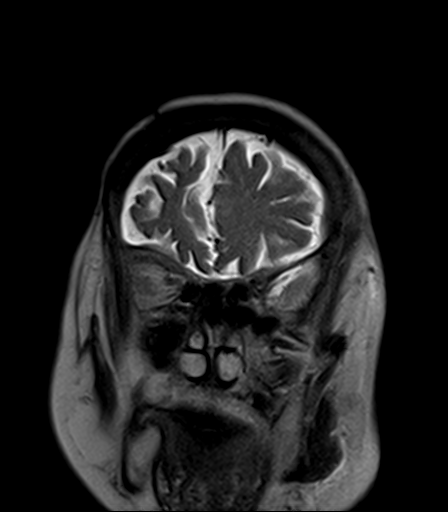
[im 16/31]
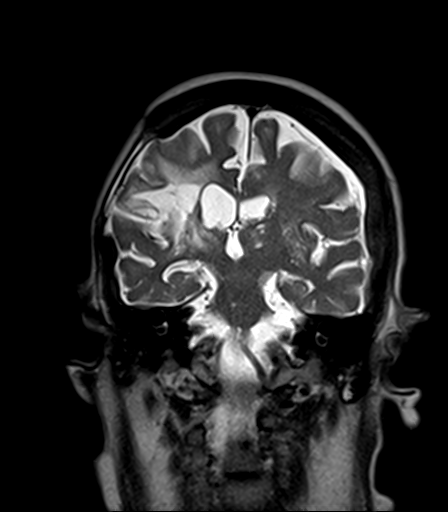
[im 23/31]
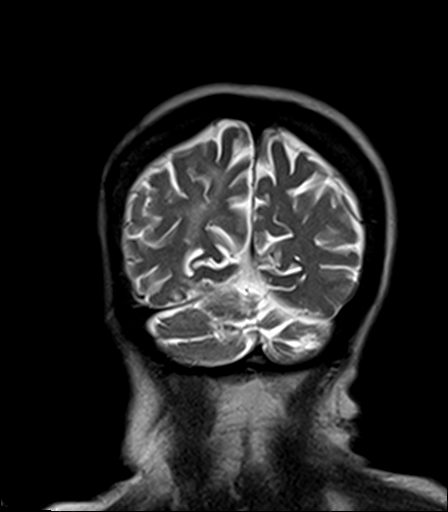
[im 31/31]
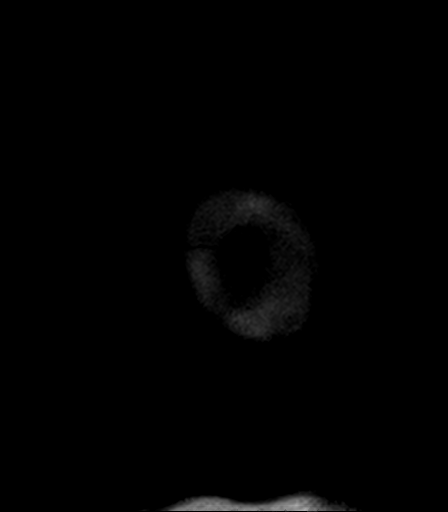

[41 of 48 positions shown; findings below may reference images not displayed]

FINDINGS: Brain: The study suffers from motion degradation. Diffusion imaging
does not show any acute or subacute infarction. Old small vessel
infarctions affect the cerebellum on both sides. Chronic
small-vessel ischemic change of the pons. Wallerian degeneration on
the right. Old small vessel infarctions of the thalami. Chronic
small-vessel ischemic changes of the cerebral hemispheric white
matter. Old right insular and frontal operculum infarction with
atrophy, encephalomalacia and gliosis. No evidence of recent
hemorrhage, hydrocephalus or extra-axial collection.

Vascular: Major vessels at the base of the brain show flow.

Skull and upper cervical spine: Large right frontal craniotomy.

Sinuses/Orbits: Clear/normal

Other: None
IMPRESSION: No acute finding by MRI. Extensive chronic small-vessel ischemic
changes throughout the brain as outlined above. Old right insular
and frontal operculum infarction with atrophy, encephalomalacia and
gliosis. Wallerian degeneration on the right. Old cerebellar
infarctions.

## 2021-01-17 IMAGING — CT CT HEAD W/O CM
4 series · 15 of 47 positions shown, 17 images · non-contrast
Comparison: None.

CLINICAL DATA: Fell.  Hit head.

EXAM:
CT HEAD WITHOUT CONTRAST
CT CERVICAL SPINE WITHOUT CONTRAST
TECHNIQUE: Multidetector CT imaging of the head and cervical spine was
performed following the standard protocol without intravenous
contrast. Multiplanar CT image reconstructions of the cervical spine
were also generated.

[Series 2: head wo · axial · 0.41mm/px · z∈[-61,+44]mm · 7 of 29 slices shown, 9 images]
[im 4/29  brain]
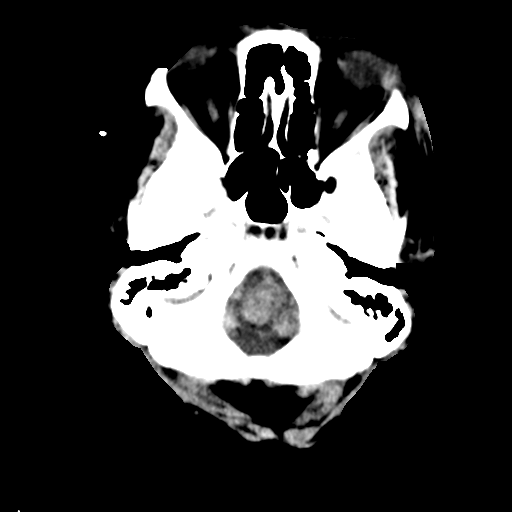
[im 4/29  bone]
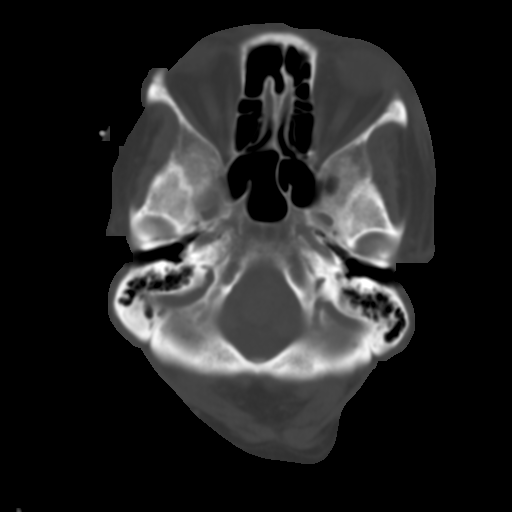
[im 8/29  brain]
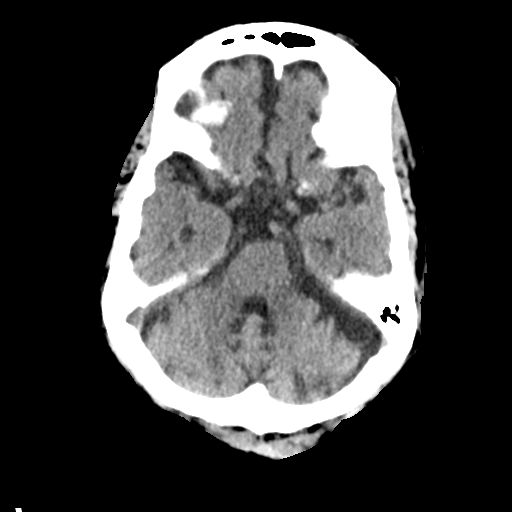
[im 11/29  brain]
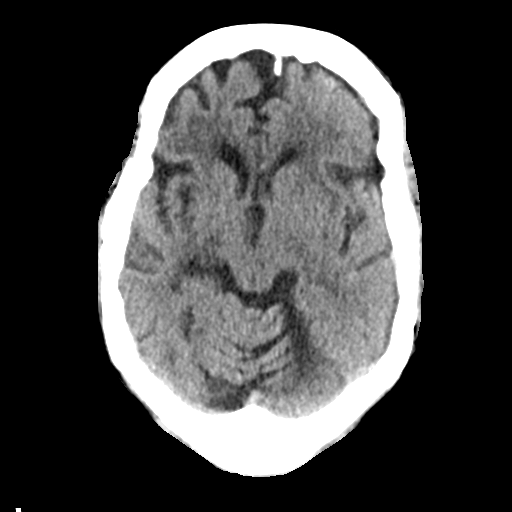
[im 15/29  brain]
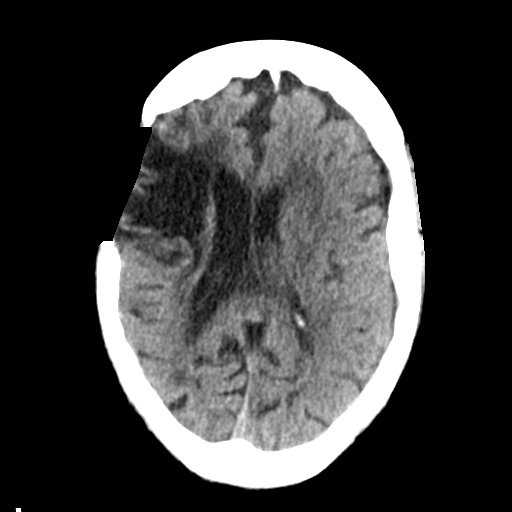
[im 18/29  brain]
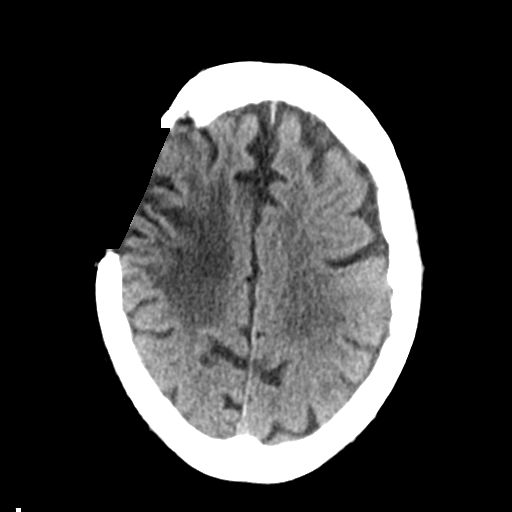
[im 18/29  bone]
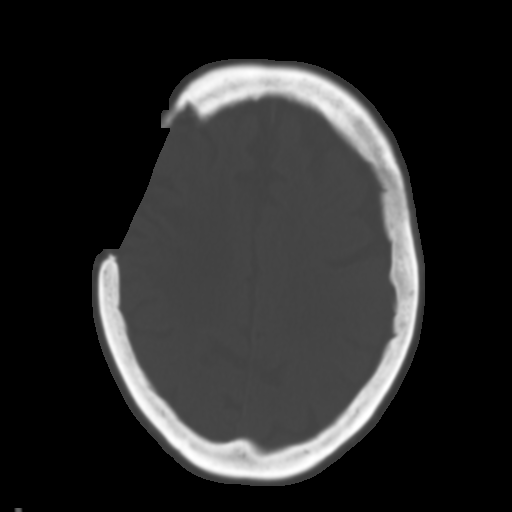
[im 22/29  brain]
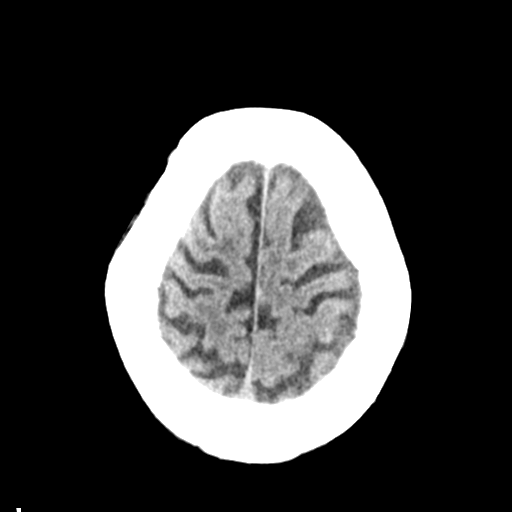
[im 25/29  brain]
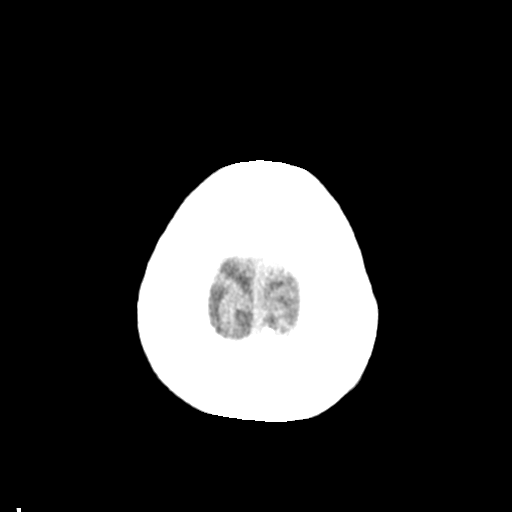

[Series 3: head bone · axial · 0.41mm/px · z∈[-62,-48]mm · 2 of 73 slices shown]
[im 8/73  bone]
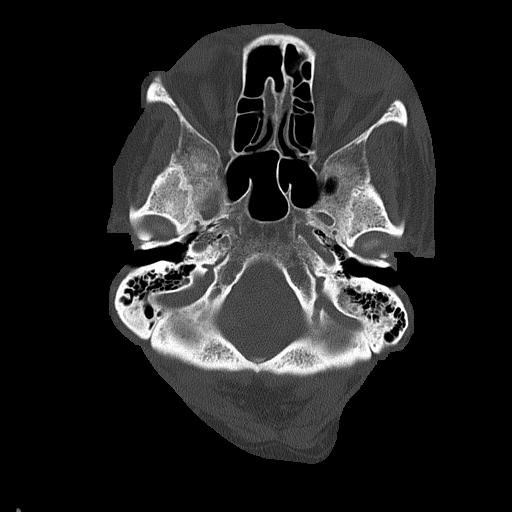
[im 15/73  bone]
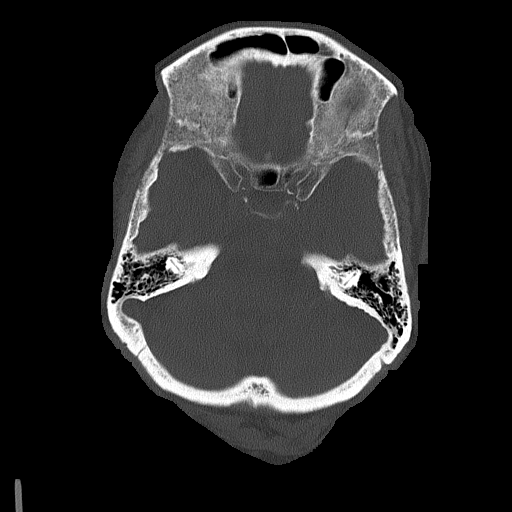

[Series 4: coronal soft tissue · coronal · 0.29mm/px · 3 of 67 slices shown]
[im 23/67  brain]
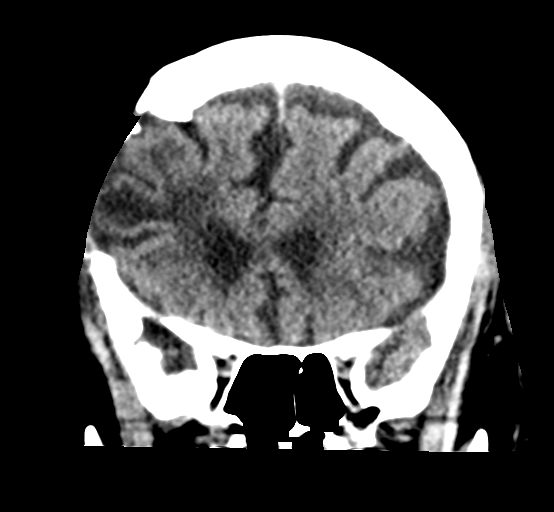
[im 30/67  brain]
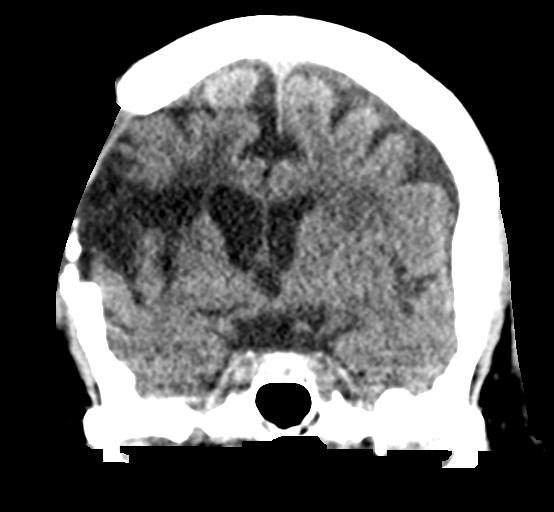
[im 37/67  brain]
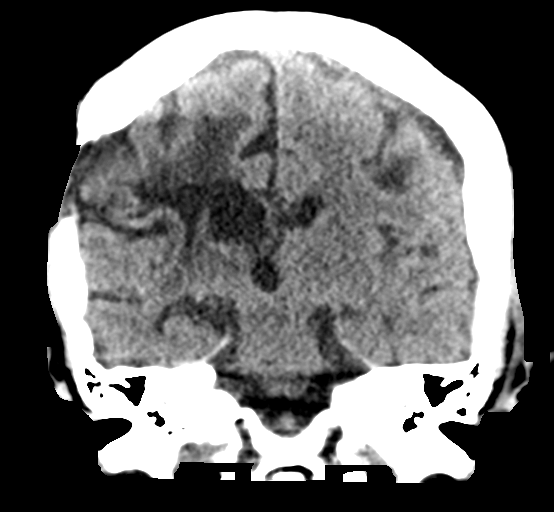

[Series 5: sagittal soft tissue · sagittal · 0.29mm/px · 3 of 53 slices shown]
[im 18/53  brain]
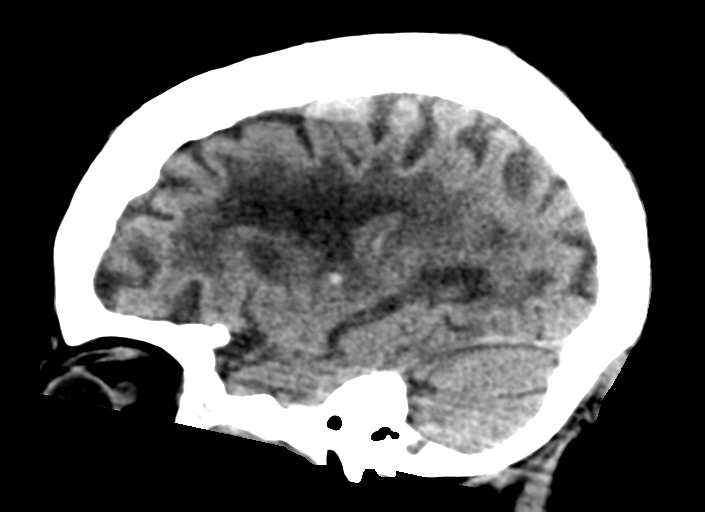
[im 27/53  brain]
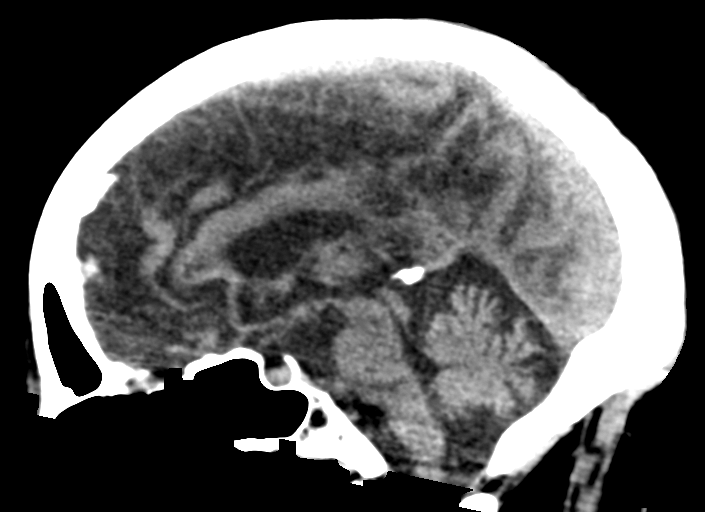
[im 35/53  brain]
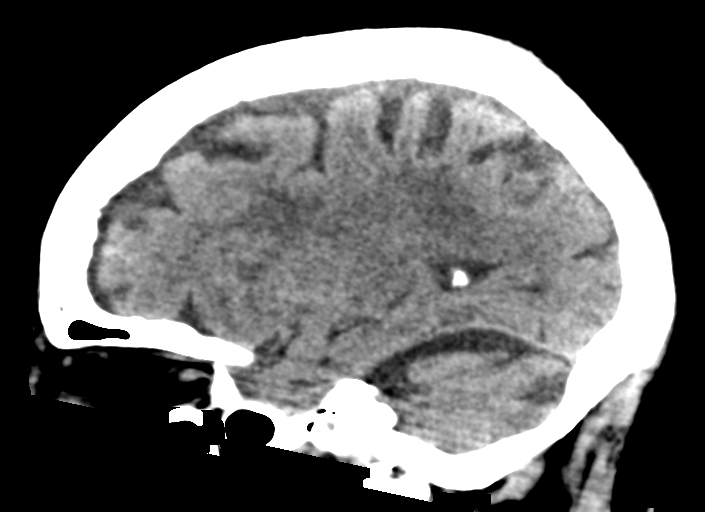

[15 of 47 positions shown; findings below may reference images not displayed]

FINDINGS: CT HEAD FINDINGS

Brain: Large craniectomy defect involving the right parietal lobe.
There is underlying extensive encephalomalacia in the right parietal
lobe. This could be postoperative or posttraumatic change. The
ventricles are in the midline without mass effect or shift. Mild ex
vacuo dilatation of the right lateral ventricle. No acute
extra-axial fluid collections are identified. No CT findings to
suggest an acute hemispheric infarction or intracranial hemorrhage.
Fairly extensive periventricular white matter disease and moderate
to advanced cerebral atrophy.

The brainstem and cerebellum are grossly normal.  Mild atrophy.

Vascular: Vascular calcifications but no aneurysm or hyperdense
vessels.

Skull: Right craniectomy defect but no acute fracture or bone
lesions.

Sinuses/Orbits: The paranasal sinuses and mastoid air cells are
clear. The globes are intact.

Other: Significant left periorbital hematoma.

CT CERVICAL SPINE FINDINGS

Alignment: Normal

Skull base and vertebrae: No acute fracture. No primary bone lesion
or focal pathologic process.

Soft tissues and spinal canal: No prevertebral fluid or swelling. No
visible canal hematoma.

Disc levels: The spinal canal is quite generous. No large disc
protrusions, spinal or foraminal stenosis.

Upper chest: The lung apices are grossly clear.

Other: Evidence of prior right-sided thyroidectomy. The left thyroid
lobe is still in place. Moderate nodularity with a 13 mm nodule
noted. Not clinically significant; no follow-up imaging recommended
(ref: [HOSPITAL]. [DATE]): 143-50).
IMPRESSION: 1. Large craniectomy defect involving the right parietal lobe with
underlying extensive encephalomalacia in the right parietal lobe.
This could be postoperative or posttraumatic change.
2. No acute intracranial findings or skull fracture.
3. Significant left periorbital hematoma.
4. Normal alignment of the cervical vertebral bodies and no acute
cervical spine fracture.

## 2021-01-17 IMAGING — DX DG HAND COMPLETE 3+V*L*
3 series · 3 of 3 positions shown · non-contrast
Comparison: None.

CLINICAL DATA: Fall, left and stiffness and swelling, history of
CVA with left-sided deficits

EXAM:
LEFT HAND - COMPLETE 3+ VIEW

[hand ap]
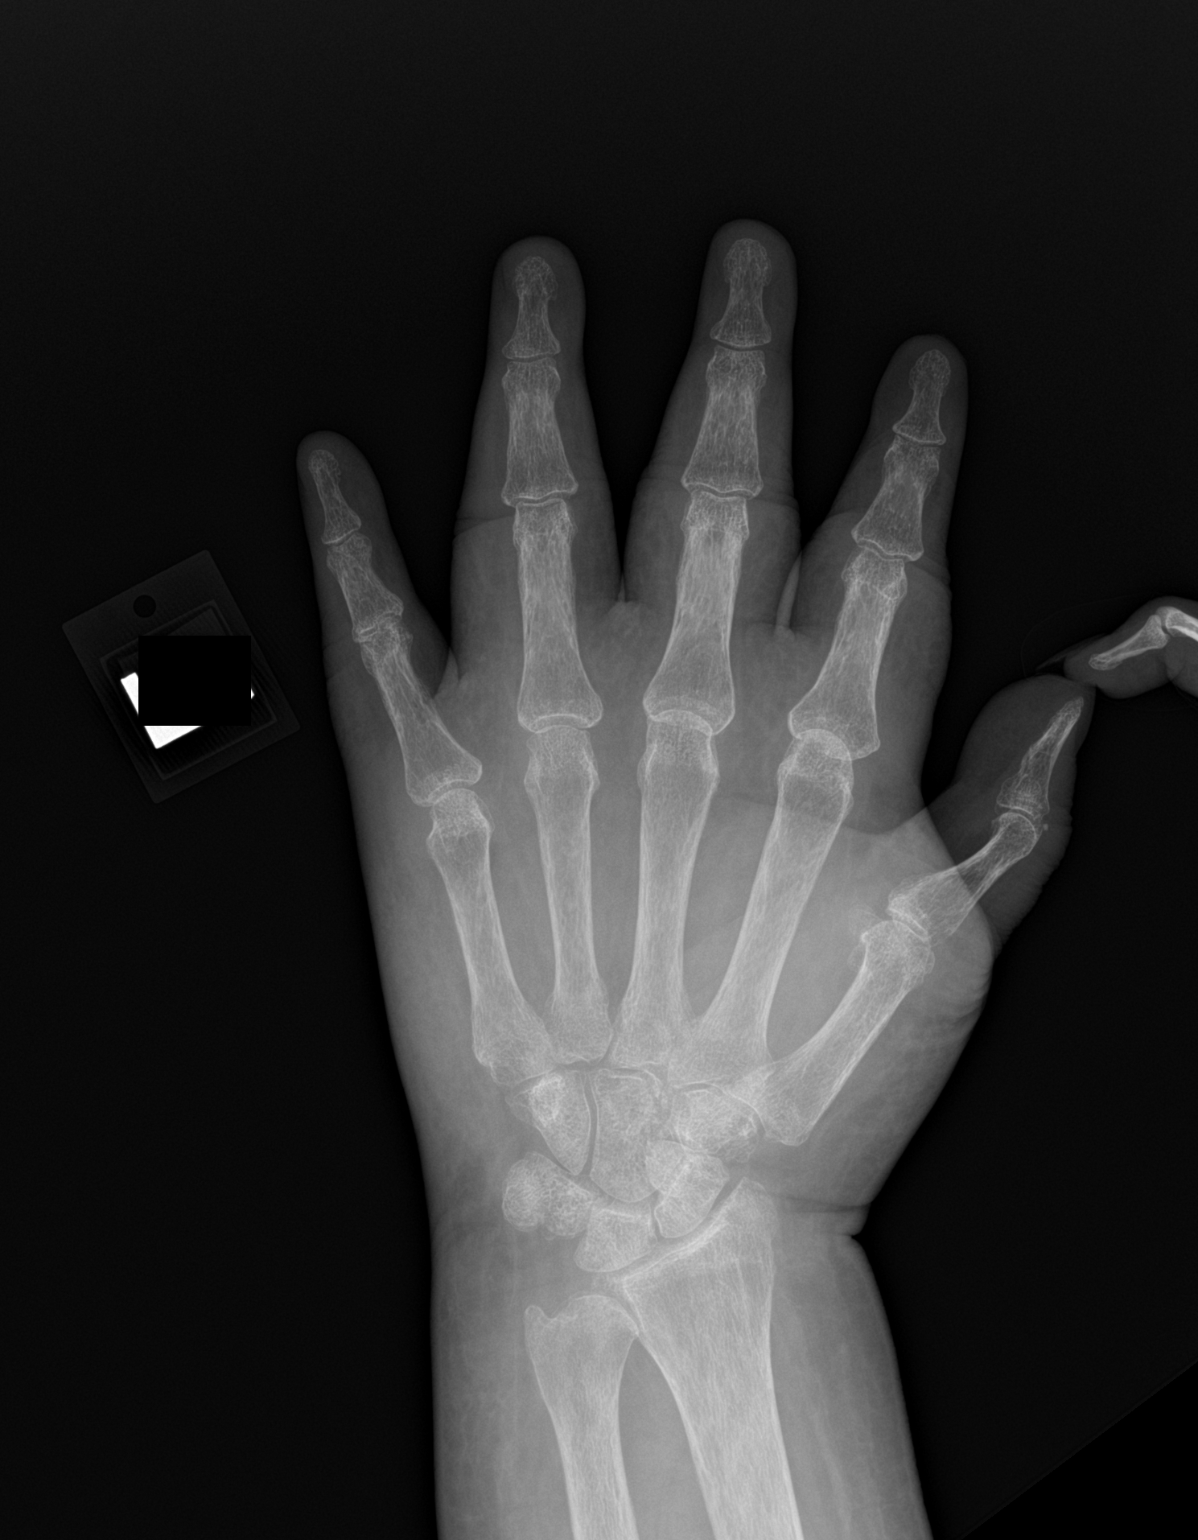

[hand obl]
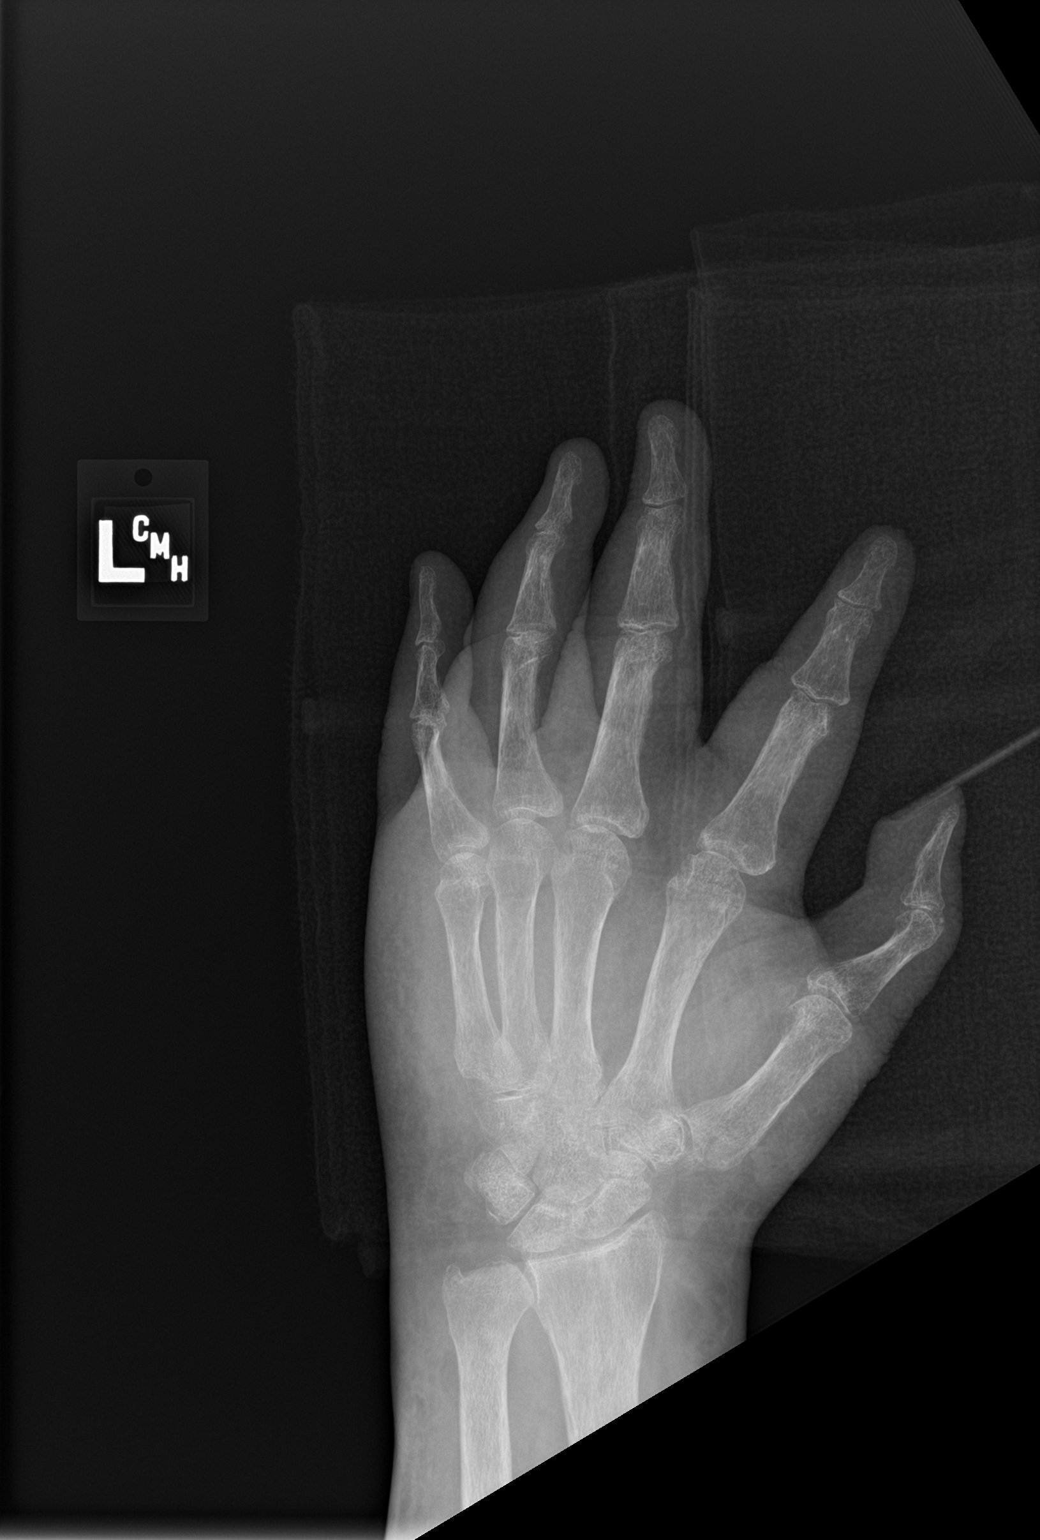

[hand lat]
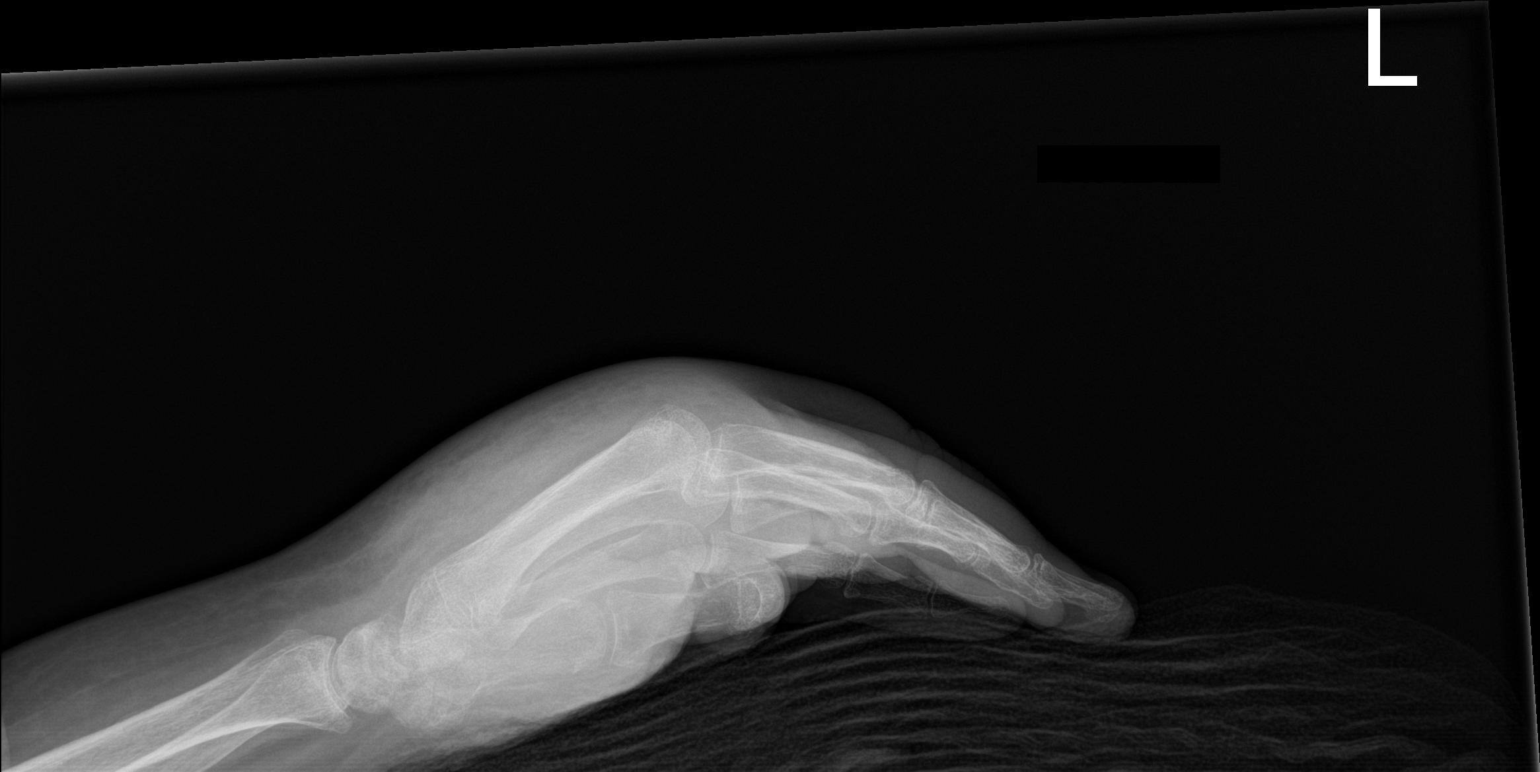

[3 of 3 positions shown; findings below may reference images not displayed]

FINDINGS: Prominent generalized dorsal left hand soft tissue swelling. No
fracture or dislocation. No suspicious focal osseous lesions. No
osseous erosions or periosteal reaction. Mild first MCP joint and
first carpometacarpal joint osteoarthritis. No radiopaque foreign
bodies.
IMPRESSION: Prominent generalized dorsal left hand soft tissue swelling. No
acute osseous abnormality. Mild first MCP joint and first
carpometacarpal joint osteoarthritis.

## 2021-01-17 IMAGING — US US RENAL
1 series · 14 of 25 positions shown · non-contrast
Comparison: None recent

CLINICAL DATA: Acute kidney injury.

EXAM:
RENAL / URINARY TRACT ULTRASOUND COMPLETE

[Series 1: us renal · 14 of 35 slices shown]
[im 1/35]
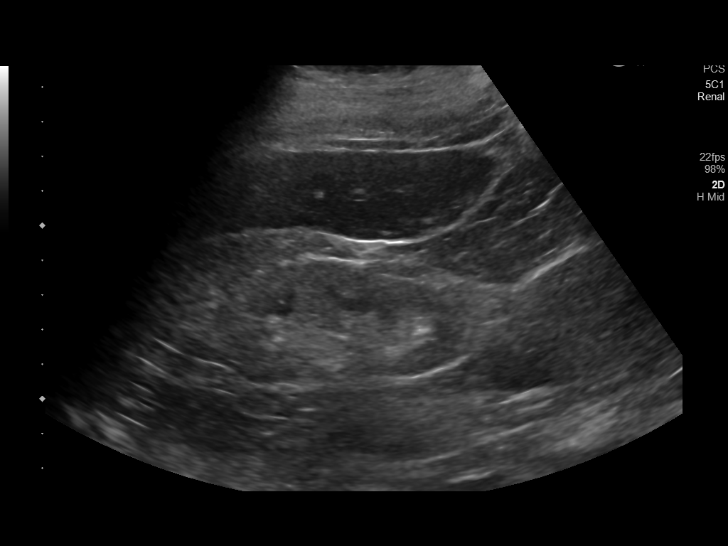
[im 3/35]
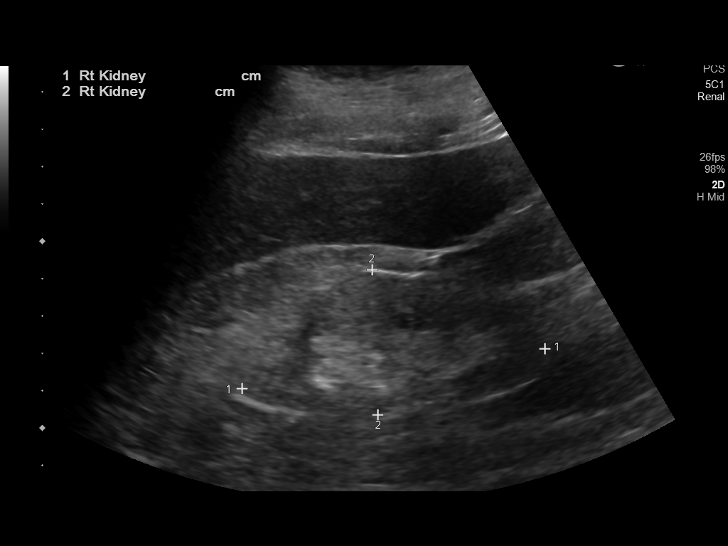
[im 6/35]
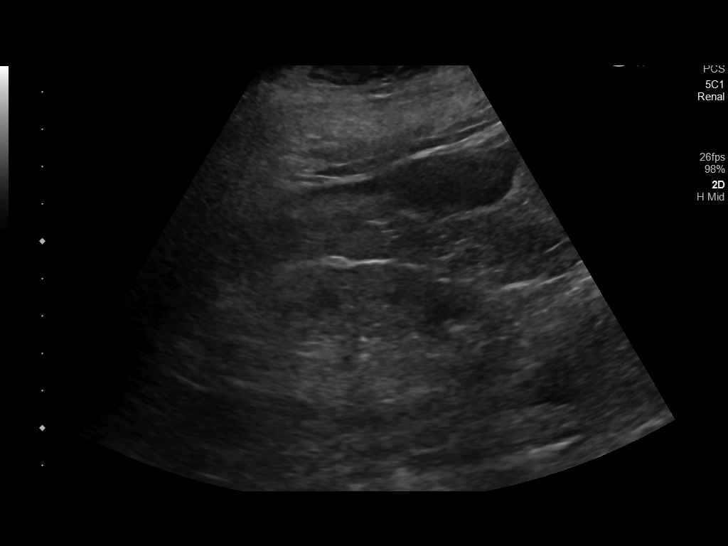
[im 9/35]
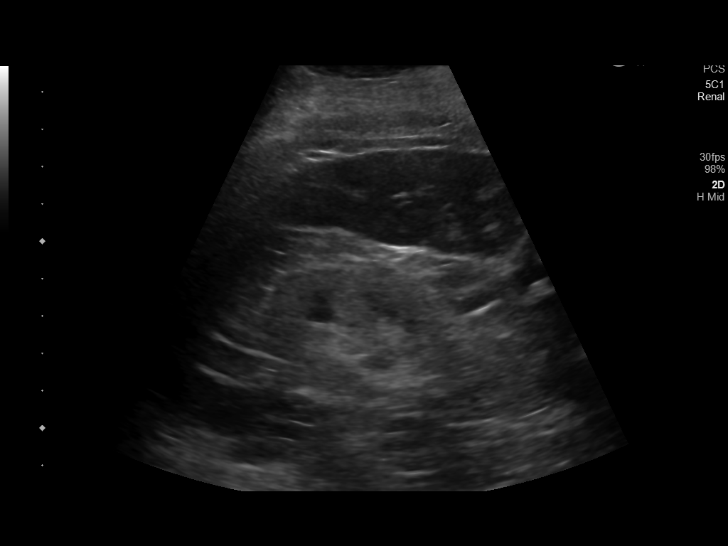
[im 12/35]
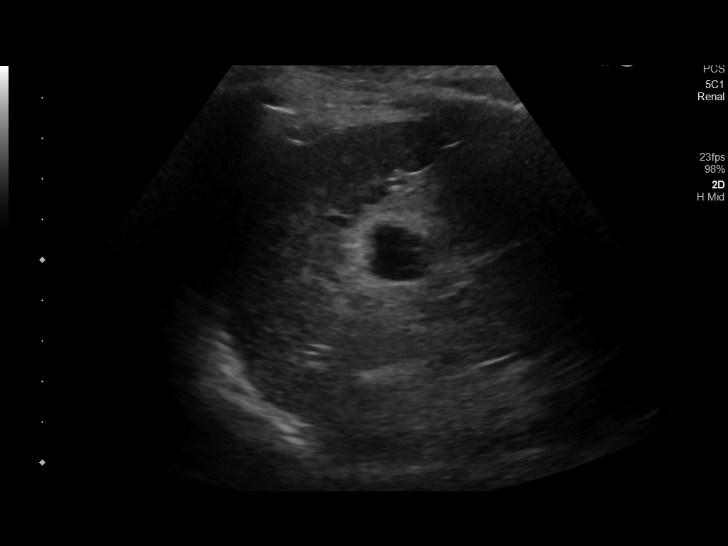
[im 13/35]
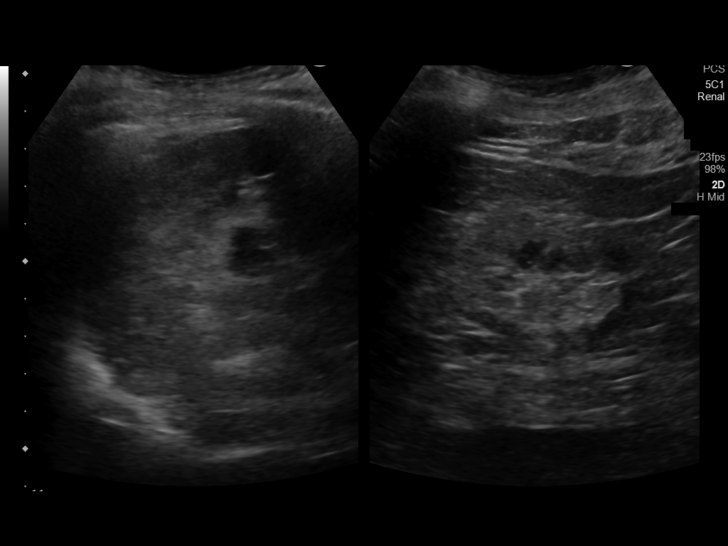
[im 16/35]
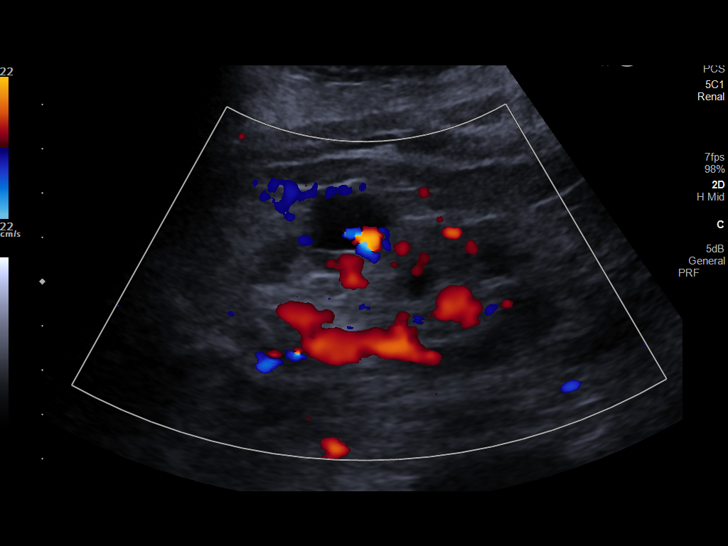
[im 19/35]
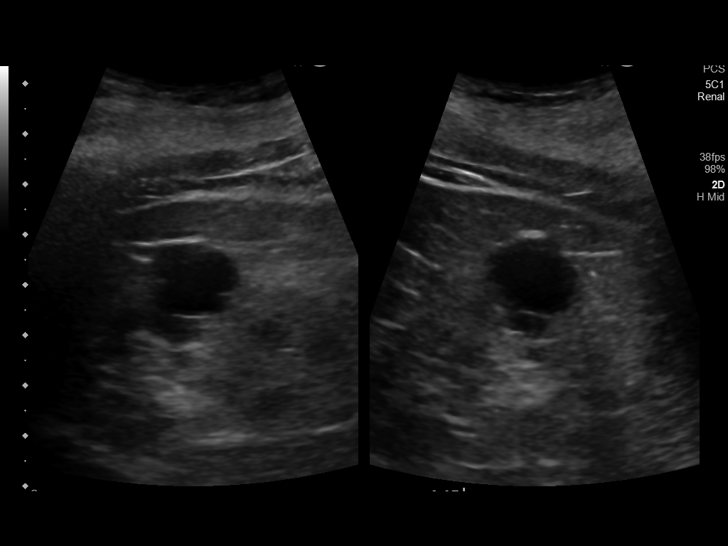
[im 22/35]
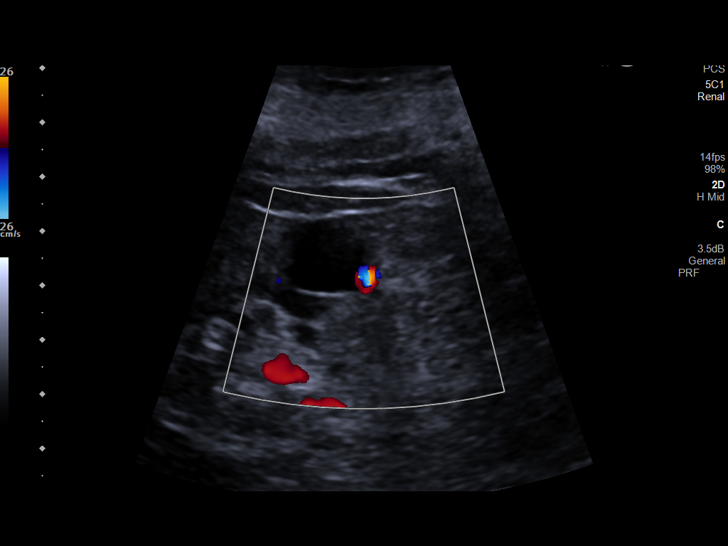
[im 23/35]
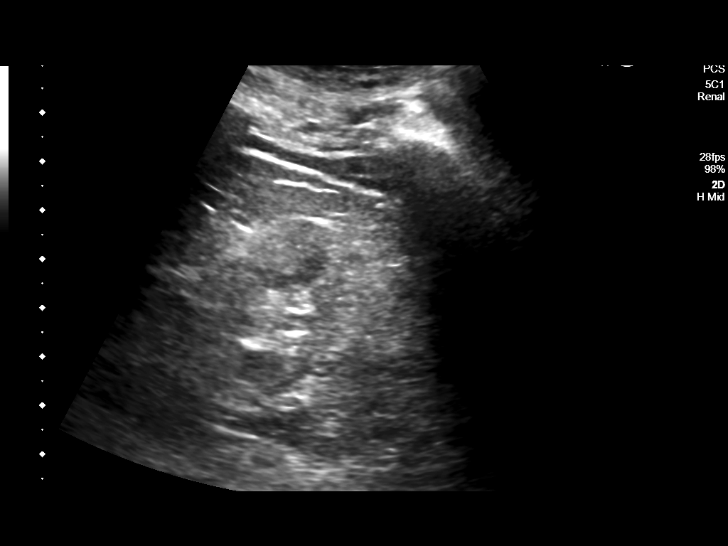
[im 26/35]
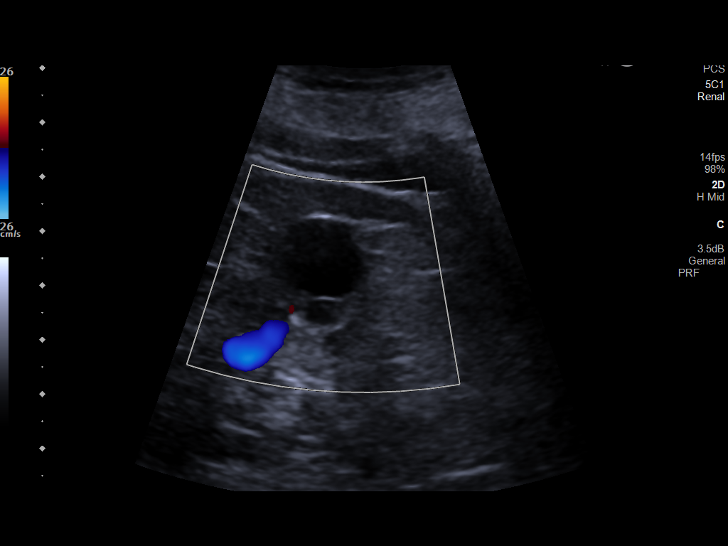
[im 29/35]
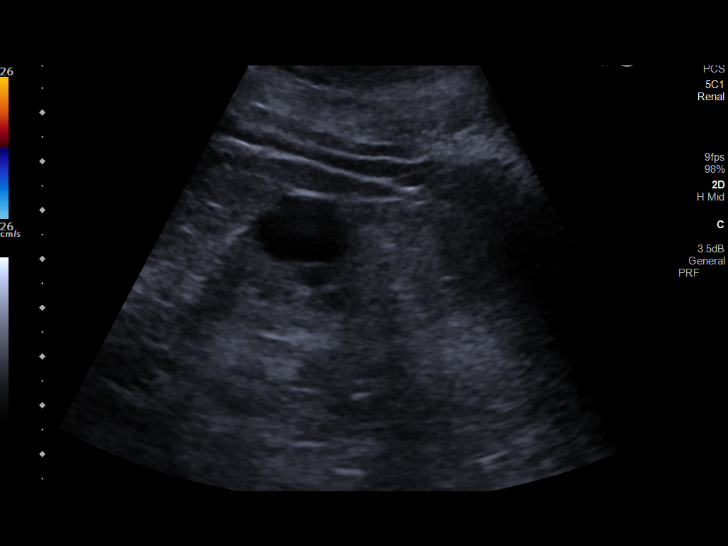
[im 32/35]
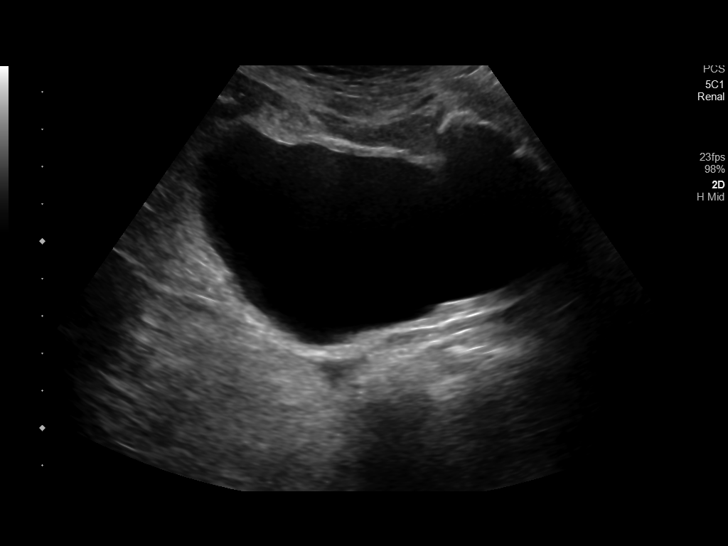
[im 35/35]
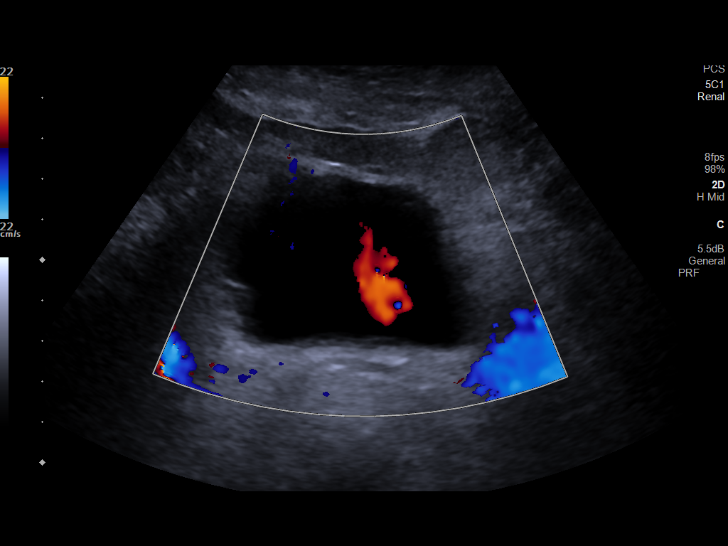

[14 of 25 positions shown; findings below may reference images not displayed]

FINDINGS: Right Kidney:

Renal measurements: 8.2 x 3.9 x 3.6 cm = volume: 60 mL. There is
increased cortical echogenicity without evidence for hydronephrosis.

Left Kidney:

Renal measurements: 9 x 3.6 cm =. There is increased cortical
echogenicity without evidence for hydronephrosis. There is a 2.1 cm
cyst in the interpolar region.

Bladder:

Appears normal for degree of bladder distention.

Other:

None.
IMPRESSION: 1. No hydronephrosis.
2. Echogenic kidneys bilaterally which can be seen in patients with
medical renal disease.

## 2021-01-17 IMAGING — CT CT MAXILLOFACIAL W/O CM
3 series · 16 of 47 positions shown, 19 images · non-contrast
Comparison: None.

CLINICAL DATA: Facial trauma. Swelling of the left eye. Fell while
going to the bathroom.

EXAM:
CT MAXILLOFACIAL WITHOUT CONTRAST
TECHNIQUE: Multidetector CT imaging of the maxillofacial structures was
performed. Multiplanar CT image reconstructions were also generated.

[Series 2: max soft · axial · 0.33mm/px · z∈[-184,-38]mm · 10 of 85 slices shown, 13 images]
[im 6/85  brain]
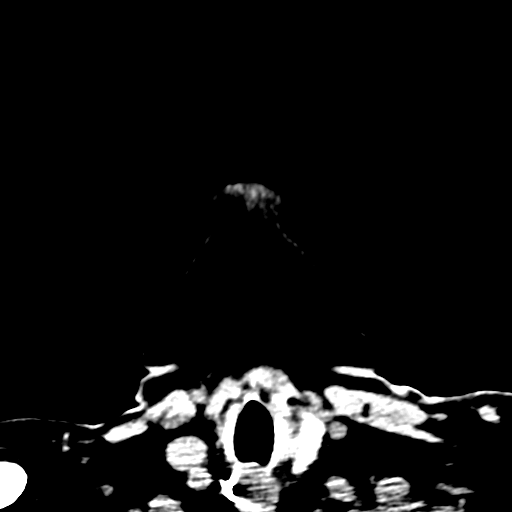
[im 6/85  bone]
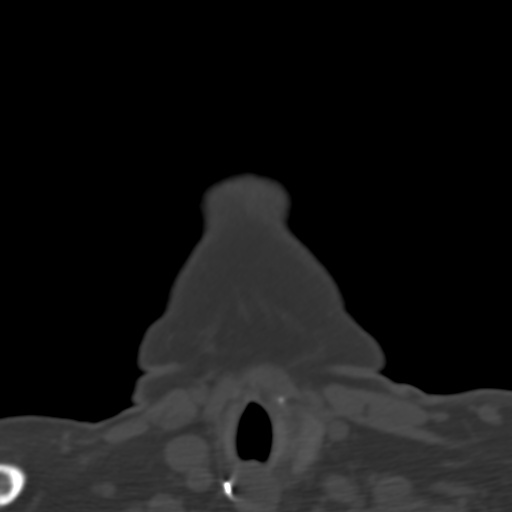
[im 15/85  bone]
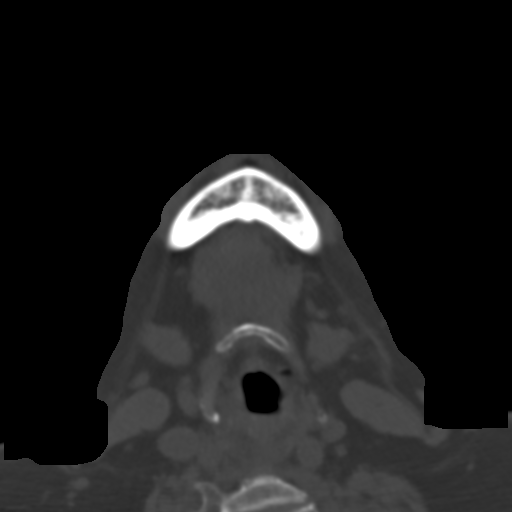
[im 24/85  bone]
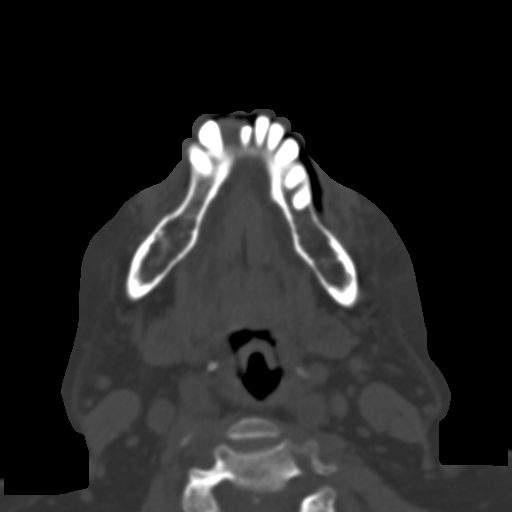
[im 29/85  bone]
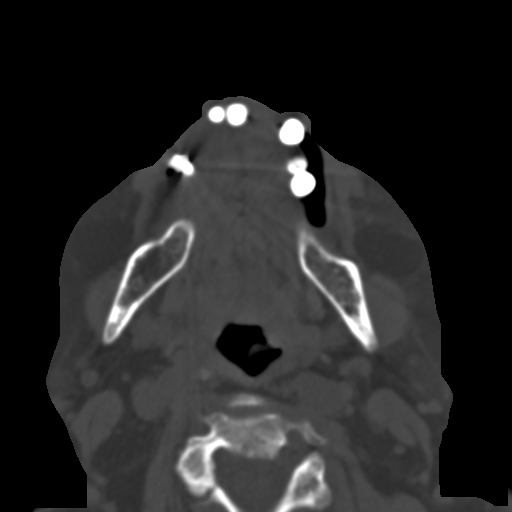
[im 38/85  brain]
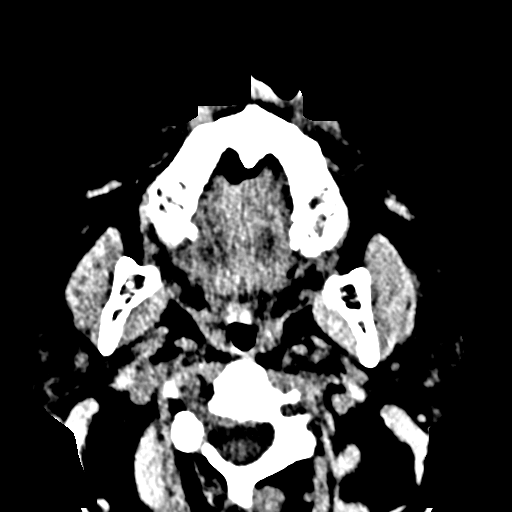
[im 38/85  bone]
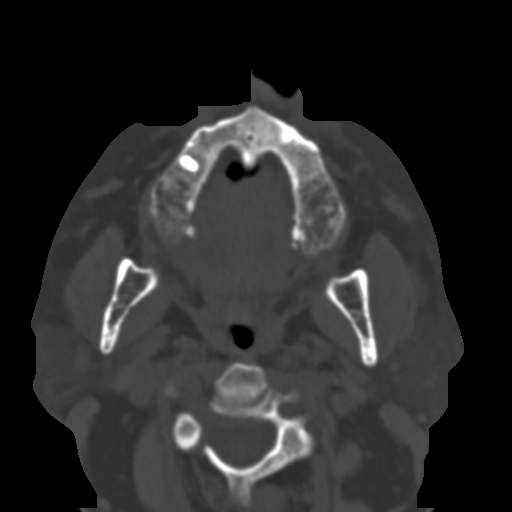
[im 47/85  bone]
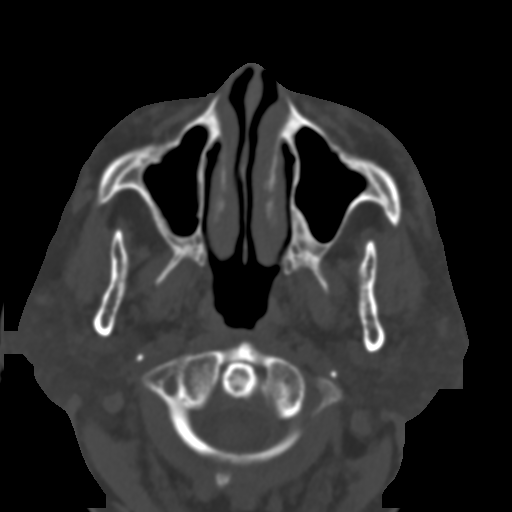
[im 56/85  bone]
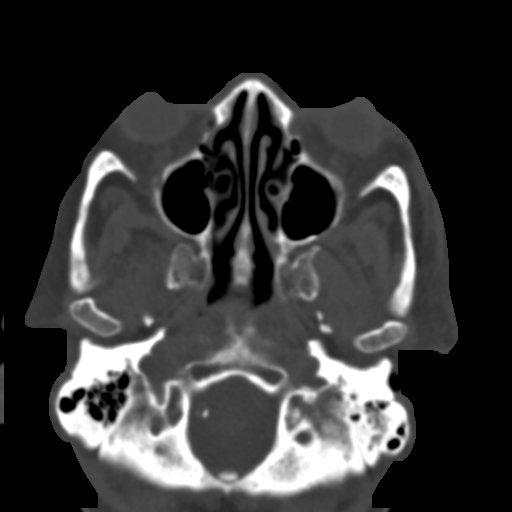
[im 64/85  bone]
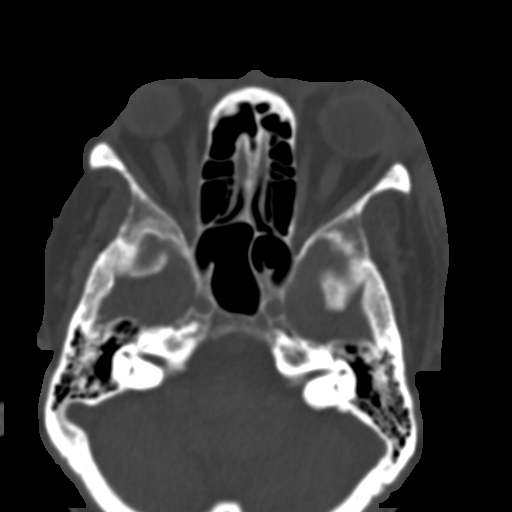
[im 70/85  brain]
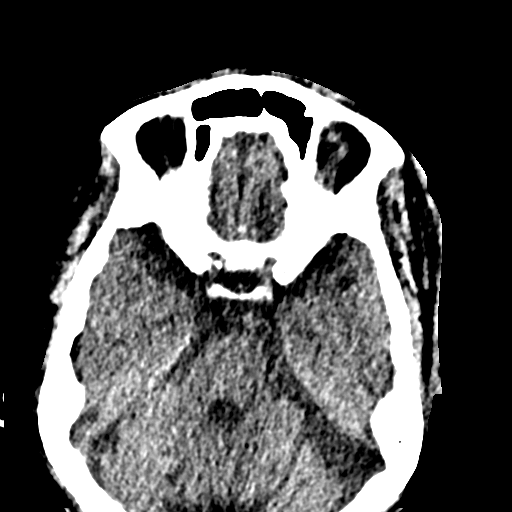
[im 70/85  bone]
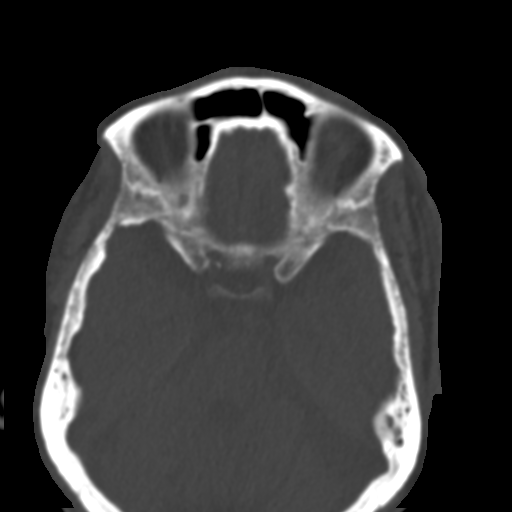
[im 79/85  bone]
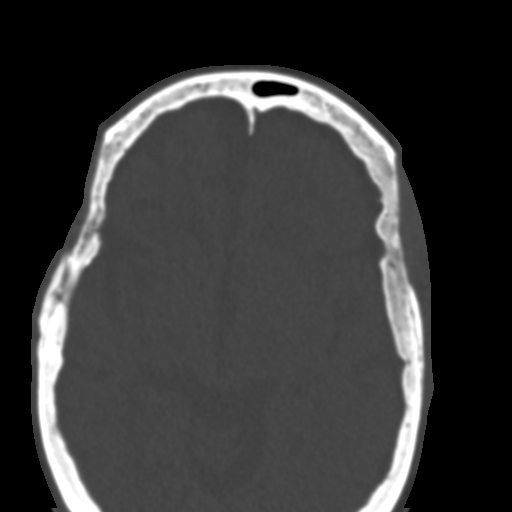

[Series 6: coronal soft · coronal · 0.34mm/px · 3 of 115 slices shown]
[im 40/115  bone]
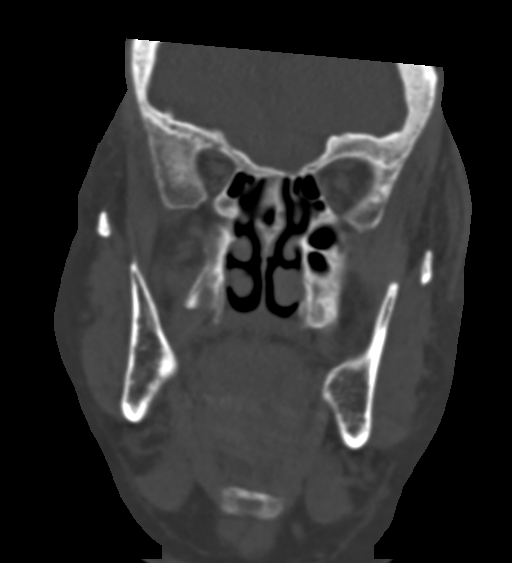
[im 52/115  bone]
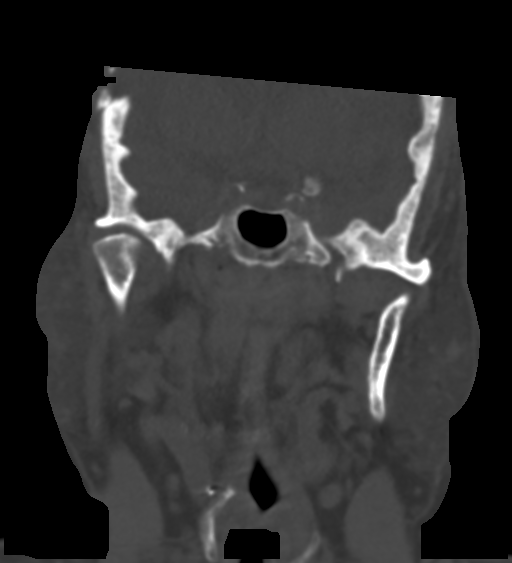
[im 63/115  bone]
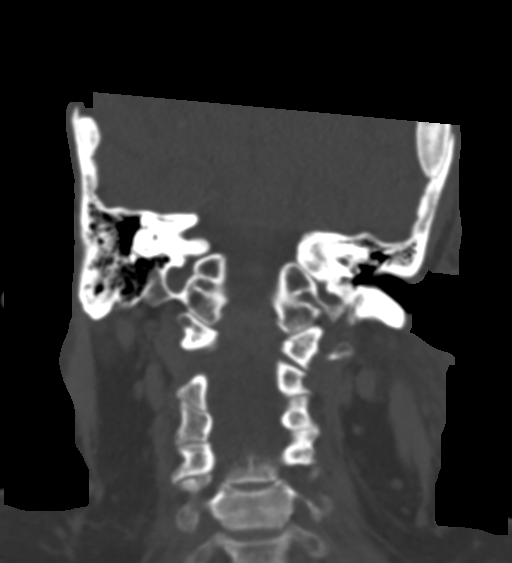

[Series 7: sagittal soft · sagittal · 0.37mm/px · 3 of 87 slices shown]
[im 29/87  bone]
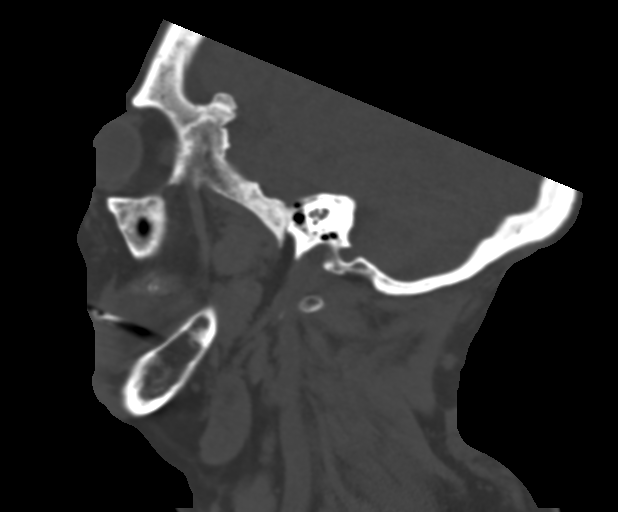
[im 44/87  bone]
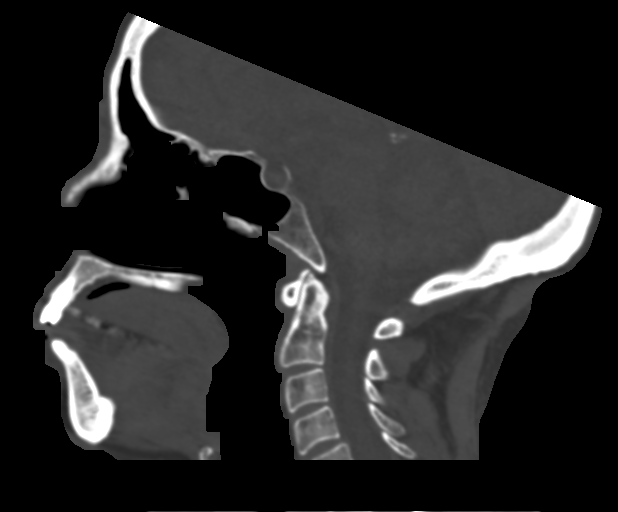
[im 58/87  bone]
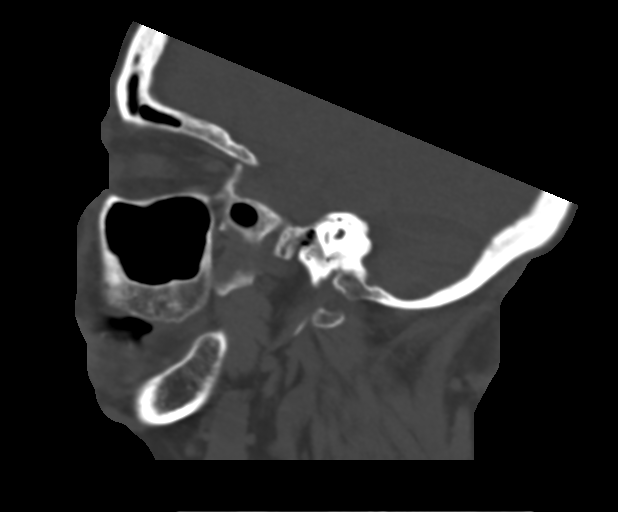

[16 of 47 positions shown; findings below may reference images not displayed]

FINDINGS: Osseous: Right-sided parietal craniectomy is noted. No acute facial
bone fractures are identified.

Orbits: The orbital bones are intact. The globes are intact. No
intraorbital hematoma.

Sinuses: The paranasal sinuses and mastoid air cells are clear.

Soft tissues: Significant left periorbital hematoma.

Limited intracranial: No significant findings. Remote posttraumatic
or postsurgical changes involving the right parietal lobe.
IMPRESSION: 1. Significant left periorbital hematoma but no acute facial bone
fractures.
2. Remote posttraumatic or postsurgical changes involving the right
parietal lobe.

## 2021-01-17 MED ORDER — ASPIRIN EC 81 MG PO TBEC
81.0000 mg | DELAYED_RELEASE_TABLET | Freq: Every day | ORAL | Status: DC
  Administered 2021-01-20 – 2021-01-21 (×2): 81 mg via ORAL
  Filled 2021-01-17 (×3): qty 1

## 2021-01-17 MED ORDER — LOPERAMIDE HCL 2 MG PO CAPS
2.0000 mg | ORAL_CAPSULE | Freq: Two times a day (BID) | ORAL | Status: DC | PRN
  Filled 2021-01-17: qty 1

## 2021-01-17 MED ORDER — INSULIN ASPART 100 UNIT/ML ~~LOC~~ SOLN: 0.0000 [IU] | Freq: Every day | SUBCUTANEOUS | Status: DC

## 2021-01-17 MED ORDER — LATANOPROST 0.005 % OP SOLN
1.0000 [drp] | Freq: Every day | OPHTHALMIC | Status: DC
  Administered 2021-01-18 – 2021-01-20 (×4): 1 [drp] via OPHTHALMIC
  Filled 2021-01-17 (×2): qty 2.5

## 2021-01-17 MED ORDER — AMLODIPINE BESYLATE 5 MG PO TABS: 5.0000 mg | ORAL_TABLET | Freq: Every day | ORAL | Status: DC

## 2021-01-17 MED ORDER — MECLIZINE HCL 25 MG PO TABS
25.0000 mg | ORAL_TABLET | Freq: Every day | ORAL | Status: DC | PRN
  Administered 2021-01-20: 25 mg via ORAL
  Filled 2021-01-17 (×2): qty 1

## 2021-01-17 MED ORDER — AMLODIPINE BESYLATE 5 MG PO TABS
7.5000 mg | ORAL_TABLET | Freq: Every day | ORAL | Status: DC
  Administered 2021-01-20 – 2021-01-21 (×2): 7.5 mg via ORAL
  Filled 2021-01-17 (×3): qty 2

## 2021-01-17 MED ORDER — LEVOTHYROXINE SODIUM 112 MCG PO TABS
112.0000 ug | ORAL_TABLET | Freq: Every day | ORAL | Status: DC
  Administered 2021-01-18 – 2021-01-21 (×3): 112 ug via ORAL
  Filled 2021-01-17 (×6): qty 1

## 2021-01-17 MED ORDER — ACETAMINOPHEN 325 MG PO TABS
650.0000 mg | ORAL_TABLET | Freq: Four times a day (QID) | ORAL | Status: DC | PRN
  Administered 2021-01-21: 650 mg via ORAL
  Filled 2021-01-17: qty 2

## 2021-01-17 MED ORDER — LACTATED RINGERS IV BOLUS
1000.0000 mL | Freq: Once | INTRAVENOUS | Status: AC
  Administered 2021-01-17: 1000 mL via INTRAVENOUS

## 2021-01-17 MED ORDER — AMLODIPINE BESYLATE 5 MG PO TABS: 2.5000 mg | ORAL_TABLET | Freq: Every day | ORAL | Status: DC

## 2021-01-17 MED ORDER — ACETAMINOPHEN 325 MG PO TABS: 650.0000 mg | ORAL_TABLET | Freq: Four times a day (QID) | ORAL | Status: DC | PRN

## 2021-01-17 MED ORDER — VITAMIN D 25 MCG (1000 UNIT) PO TABS
1000.0000 [IU] | ORAL_TABLET | Freq: Every day | ORAL | Status: DC
  Administered 2021-01-20 – 2021-01-21 (×2): 1000 [IU] via ORAL
  Filled 2021-01-17 (×3): qty 1

## 2021-01-17 MED ORDER — MIRABEGRON ER 25 MG PO TB24
25.0000 mg | ORAL_TABLET | Freq: Every day | ORAL | Status: DC
Start: 2021-01-18 — End: 2021-01-17

## 2021-01-17 MED ORDER — METOPROLOL SUCCINATE ER 25 MG PO TB24
12.5000 mg | ORAL_TABLET | Freq: Every day | ORAL | Status: DC
Start: 2021-01-18 — End: 2021-01-21
  Administered 2021-01-20 – 2021-01-21 (×2): 12.5 mg via ORAL
  Filled 2021-01-17 (×3): qty 1

## 2021-01-17 MED ORDER — ACETAMINOPHEN 650 MG RE SUPP: 650.0000 mg | Freq: Four times a day (QID) | RECTAL | Status: DC | PRN

## 2021-01-17 MED ORDER — HYDRALAZINE HCL 20 MG/ML IJ SOLN: 5.0000 mg | INTRAMUSCULAR | Status: DC | PRN

## 2021-01-17 MED ORDER — LETROZOLE 2.5 MG PO TABS
2.5000 mg | ORAL_TABLET | Freq: Every day | ORAL | Status: DC
  Administered 2021-01-20 – 2021-01-21 (×2): 2.5 mg via ORAL
  Filled 2021-01-17 (×4): qty 1

## 2021-01-17 MED ORDER — INSULIN ASPART 100 UNIT/ML ~~LOC~~ SOLN
0.0000 [IU] | Freq: Three times a day (TID) | SUBCUTANEOUS | Status: DC
  Administered 2021-01-17 – 2021-01-20 (×2): 2 [IU] via SUBCUTANEOUS
  Administered 2021-01-20: 1 [IU] via SUBCUTANEOUS
  Filled 2021-01-17 (×3): qty 1

## 2021-01-17 MED ORDER — LACTATED RINGERS IV SOLN: INTRAVENOUS | Status: DC

## 2021-01-17 NOTE — ED Notes (Signed)
Dr Niu at bedside 

## 2021-01-17 NOTE — H&P (Signed)
History and Physical    Kirsten Mcmahon HCW:[id number] DOB: 02/15/1946 DOA: 01/17/2021  Referring MD/NP/PA:   PCP: System, Provider Not In   Patient coming from:  The patient is coming from home.  At baseline, pt is partially dependent for most of ADL.        Chief Complaint: fall   HPI: Kirsten Mcmahon is a 75 y.o. female with medical history significant of hypertension, hyperlipidemia, diabetes mellitus, stroke with left-sided weakness, wheelchair-bound, GERD, hypothyroidism, depression, breast cancer, CKD stage IIIb, who presents with a fall.  Per her daughter (I called her daughter by phone), pt fell while transferring form wheelchair to toilet yesterday, causing her to land onto her face on the bathroom floor. No LOC. She injured her left eye. She has swelling around left eye.  Patient is mildly confused, but answered most of questions appropriately. She reports that she always has swelling to her left hand after her remote stroke and does not think she fell onto it.  Patient denies chest pain, cough, shortness breath, fever or chills.  No nausea, vomiting, diarrhea, abdominal pain.  Denies symptoms of UTI. Daughter states that pt has more difficult speaking.    ED Course: pt was found to have WBC 18.3, pending COVID-19 PCR, troponin level 7, worsening renal function, temperature normal, blood pressure 149/82, heart rate 98, RR 19, oxygen saturation 97% on room air.  X-ray of left hand is negative for bony fracture.  CT of the C-spine is negative for acute bony fracture.  Patient is placed in [place] bed for position, Dr. Juleen China of nephrology is consulted.  CT-head: 1. Large craniectomy defect involving the right parietal lobe with underlying extensive encephalomalacia in the right parietal lobe. This could be postoperative or posttraumatic change. 2. No acute intracranial findings or skull fracture. 3. Significant left periorbital hematoma. 4. Normal alignment of the cervical  vertebral bodies and no acute cervical spine fracture.  CT-maxillofacial image 1. Significant left periorbital hematoma but no acute facial bone fractures. 2. Remote posttraumatic or postsurgical changes involving the right parietal lobe.  Review of Systems:   General: no fevers, chills, no body weight gain, has fatigue HEENT: no vision loss, hearing changes or sore throat. Has swelling around left eye Respiratory: no dyspnea, coughing, wheezing CV: no chest pain, no palpitations GI: no nausea, vomiting, abdominal pain, diarrhea, constipation GU: no dysuria, burning on urination, increased urinary frequency, hematuria  Ext: no leg edema. Has left hand swelling Neuro: No vision change or hearing loss. Has fall.  Has left-sided weakness from previous stroke Skin: no rash, no skin tear. MSK: No muscle spasm, no deformity, no limitation of range of movement in spin Heme: No easy bruising.  Travel history: No recent long distant travel.  Allergy:  Allergies  Allergen Reactions  . Levemir [Insulin Detemir] Itching    Past Medical History:  Diagnosis Date  . Cancer [facility]) 2017   Right breast  . Depression   . GERD (gastroesophageal reflux disease)   . Glaucoma   . Hemiparesis ([place])    left side  . High cholesterol   . History of seizure    x 1 - after a spider bite  . History of stroke with residual deficit    left-side weakness  . Hypertension    states BP under control with meds., has been on med. x 2 yr.  . Hypothyroidism   . Non-insulin dependent type 2 diabetes mellitus ([place])   . Overactive bladder   .  Stroke [facility])    1998 weakness on left side    Past Surgical History:  Procedure Laterality Date  . ABDOMINAL HYSTERECTOMY     complete  . BREAST BIOPSY Left 08/03/2018   Benign adipose tissue  . BREAST EXCISIONAL BIOPSY Right 2014   Positive  . BREAST LUMPECTOMY Right   . CATARACT EXTRACTION W/ INTRAOCULAR LENS IMPLANT Left   . CEREBRAL ANEURYSM REPAIR  1998  .  PICC LINE INSERTION    . THYROID LOBECTOMY Right 07/15/2017   Procedure: RIGHT THYROID LOBECTOMY;  Surgeon: Armandina Gemma, MD;  Location: [place];  Service: General;  Laterality: Right;    Social History:  reports that she has never smoked. She has never used smokeless tobacco. She reports that she does not drink alcohol and does not use drugs.  Family History:  Family History  Problem Relation Age of Onset  . Cancer Brother        possible prostate cancer per her daughter  . Breast cancer Neg Hx      Prior to Admission medications   Medication Sig Start Date End Date Taking? Authorizing Provider  alendronate (FOSAMAX) 70 MG tablet Take 1 tablet (70 mg total) by mouth once a week. Take with a full glass of water on an empty stomach. 09/07/18   Coral Spikes, DO  amLODipine (NORVASC) 5 MG tablet Take 1 tablet (5 mg total) by mouth daily. 09/07/18   Coral Spikes, DO  aspirin EC 81 MG tablet Take 81 mg by mouth daily.    [provider]  chlorthalidone (HYGROTON) 25 MG tablet Take 1 tablet (25 mg total) by mouth daily. 09/07/18   Coral Spikes, DO  cholecalciferol (VITAMIN D) 1000 units tablet Take 1,000 Units by mouth daily.    [provider]  latanoprost (XALATAN) 0.005 % ophthalmic solution Place 1 drop into both eyes at bedtime. 09/07/18   Coral Spikes, DO  letrozole [facility]) 2.5 MG tablet Take 1 tablet (2.5 mg total) by mouth daily. 02/16/19   Nicholas Lose, MD  levothyroxine (SYNTHROID, LEVOTHROID) 112 MCG tablet Take 1 tablet (112 mcg total) by mouth daily before breakfast. 09/07/18   Coral Spikes, DO  losartan (COZAAR) 100 MG tablet Take 1 tablet (100 mg total) by mouth daily. 09/07/18   Coral Spikes, DO  metFORMIN (GLUCOPHAGE-XR) 500 MG 24 hr tablet Take 1 tablet (500 mg total) by mouth daily. 09/07/18   Coral Spikes, DO  mirabegron ER (MYRBETRIQ) 25 MG TB24 tablet Take 1 tablet (25 mg total) by mouth daily. 09/07/18   Coral Spikes, DO  Probiotic Product (PROBIOTIC DAILY  PO) Take by mouth.    [provider]  ranitidine (ZANTAC) 75 MG tablet Take 75 mg by mouth daily.    [provider]  traMADol (ULTRAM) 50 MG tablet Take 1-2 tablets (50-100 mg total) by mouth every 6 (six) hours as needed for moderate pain. 08/03/18   Armandina Gemma, MD    Physical Exam: Vitals:   01/17/21 1400 01/17/21 1430 01/17/21 1500 01/17/21 1530  BP: 138/83 139/87 (!) 148/83 (!) 150/94  Pulse: 91 95 94 95  Resp: (!) 24 (!) 21 17 (!) 25  Temp:      TempSrc:      SpO2: 100% 98% 100% 100%  Weight:      Height:       General: Not in acute distress HEENT:       Eyes: PERRL, EOMI, no scleral icterus.  ENT: No discharge from the ears and nose, no pharynx injection, no tonsillar enlargement.        Neck: No JVD, no bruit, no mass felt. Heme: No neck lymph node enlargement. Cardiac: S1/S2, RRR, No murmurs, No gallops or rubs. Respiratory: No rales, wheezing, rhonchi or rubs. GI: Soft, nondistended, nontender, no rebound pain, no organomegaly, BS present. GU: No hematuria Ext: No pitting leg edema bilaterally. 1+DP/PT pulse bilaterally.  Has left hand swelling. Musculoskeletal: No joint deformities, No joint redness or warmth, no limitation of ROM in spin. Skin: No rashes.  Neuro: Mildly confused, orientated to place and person, missed year 2022, cranial nerves II-XII grossly intact.  Has left-sided weakness from previous stroke, particularly in left arm. Psych: Patient is not psychotic, no suicidal or hemocidal ideation.  Labs on Admission: I have personally reviewed following labs and imaging studies  CBC: Recent Labs  Lab 01/17/21 1202  WBC 10.3  NEUTROABS 7.7  HGB 9.3*  HCT 29.3*  MCV 91.3  PLT 740   Basic Metabolic Panel: Recent Labs  Lab 01/17/21 1202  NA 142  K 4.0  CL 108  CO2 19*  GLUCOSE 108*  BUN 38*  CREATININE 4.72*  CALCIUM 8.7*   GFR: Estimated Creatinine Clearance: 9.4 mL/min (A) (by C-G formula based on SCr of 4.72 mg/dL  (H)). Liver Function Tests: Recent Labs  Lab 01/17/21 1202  AST 19  ALT 10  ALKPHOS 70  BILITOT 0.6  PROT 7.9  ALBUMIN 3.2*   No results for input(s): LIPASE, AMYLASE in the last 168 hours. No results for input(s): AMMONIA in the last 168 hours. Coagulation Profile: No results for input(s): INR, PROTIME in the last 168 hours. Cardiac Enzymes: No results for input(s): CKTOTAL, CKMB, CKMBINDEX, TROPONINI in the last 168 hours. BNP (last 3 results) No results for input(s): PROBNP in the last 8760 hours. HbA1C: No results for input(s): HGBA1C in the last 72 hours. CBG: Recent Labs  Lab 01/17/21 1737  GLUCAP 158*   Lipid Profile: No results for input(s): CHOL, HDL, LDLCALC, TRIG, CHOLHDL, LDLDIRECT in the last 72 hours. Thyroid Function Tests: No results for input(s): TSH, T4TOTAL, FREET4, T3FREE, THYROIDAB in the last 72 hours. Anemia Panel: No results for input(s): VITAMINB12, FOLATE, FERRITIN, TIBC, IRON, RETICCTPCT in the last 72 hours. Urine analysis:    Component Value Date/Time   COLORURINE STRAW (A) 01/17/2021 1733   APPEARANCEUR CLEAR (A) 01/17/2021 1733   LABSPEC 1.009 01/17/2021 1733   PHURINE 6.0 01/17/2021 1733   GLUCOSEU NEGATIVE 01/17/2021 1733   HGBUR SMALL (A) 01/17/2021 1733   BILIRUBINUR NEGATIVE 01/17/2021 1733   KETONESUR NEGATIVE 01/17/2021 1733   PROTEINUR 100 (A) 01/17/2021 1733   NITRITE NEGATIVE 01/17/2021 1733   LEUKOCYTESUR NEGATIVE 01/17/2021 1733   Sepsis Labs: @LABRCNTIP (procalcitonin:4,lacticidven:4) )No results found for this or any previous visit (from the past 240 hour(s)).   Radiological Exams on Admission: CT Head Wo Contrast  Result Date: 01/17/2021 CLINICAL DATA:  [place].  Hit head. EXAM: CT HEAD WITHOUT CONTRAST CT CERVICAL SPINE WITHOUT CONTRAST TECHNIQUE: Multidetector CT imaging of the head and cervical spine was performed following the standard protocol without intravenous contrast. Multiplanar CT image reconstructions of  the cervical spine were also generated. COMPARISON:  None. FINDINGS: CT HEAD FINDINGS Brain: Large craniectomy defect involving the right parietal lobe. There is underlying extensive encephalomalacia in the right parietal lobe. This could be postoperative or posttraumatic change. The ventricles are in the midline without mass effect or shift. Mild ex  vacuo dilatation of the right lateral ventricle. No acute extra-axial fluid collections are identified. No CT findings to suggest an acute hemispheric infarction or intracranial hemorrhage. Fairly extensive periventricular white matter disease and moderate to advanced cerebral atrophy. The brainstem and cerebellum are grossly normal.  Mild atrophy. Vascular: Vascular calcifications but no aneurysm or hyperdense vessels. Skull: Right craniectomy defect but no acute fracture or bone lesions. Sinuses/Orbits: The paranasal sinuses and mastoid air cells are clear. The globes are intact. Other: Significant left periorbital hematoma. CT CERVICAL SPINE FINDINGS Alignment: Normal Skull base and vertebrae: No acute fracture. No primary bone lesion or focal pathologic process. Soft tissues and spinal canal: No prevertebral fluid or swelling. No visible canal hematoma. Disc levels: The spinal canal is quite generous. No large disc protrusions, spinal or foraminal stenosis. Upper chest: The lung apices are grossly clear. Other: Evidence of prior right-sided thyroidectomy. The left thyroid lobe is still in place. Moderate nodularity with a 13 mm nodule noted. Not clinically significant; no follow-up imaging recommended (ref: J Am Coll Radiol. 2015 Feb;12(2): 143-50). IMPRESSION: 1. Large craniectomy defect involving the right parietal lobe with underlying extensive encephalomalacia in the right parietal lobe. This could be postoperative or posttraumatic change. 2. No acute intracranial findings or skull fracture. 3. Significant left periorbital hematoma. 4. Normal alignment of the  cervical vertebral bodies and no acute cervical spine fracture. Electronically Signed   By: Marijo Sanes M.D.   On: 01/17/2021 13:06   CT Cervical Spine Wo Contrast  Result Date: 01/17/2021 CLINICAL DATA:  [place].  Hit head. EXAM: CT HEAD WITHOUT CONTRAST CT CERVICAL SPINE WITHOUT CONTRAST TECHNIQUE: Multidetector CT imaging of the head and cervical spine was performed following the standard protocol without intravenous contrast. Multiplanar CT image reconstructions of the cervical spine were also generated. COMPARISON:  None. FINDINGS: CT HEAD FINDINGS Brain: Large craniectomy defect involving the right parietal lobe. There is underlying extensive encephalomalacia in the right parietal lobe. This could be postoperative or posttraumatic change. The ventricles are in the midline without mass effect or shift. Mild ex vacuo dilatation of the right lateral ventricle. No acute extra-axial fluid collections are identified. No CT findings to suggest an acute hemispheric infarction or intracranial hemorrhage. Fairly extensive periventricular white matter disease and moderate to advanced cerebral atrophy. The brainstem and cerebellum are grossly normal.  Mild atrophy. Vascular: Vascular calcifications but no aneurysm or hyperdense vessels. Skull: Right craniectomy defect but no acute fracture or bone lesions. Sinuses/Orbits: The paranasal sinuses and mastoid air cells are clear. The globes are intact. Other: Significant left periorbital hematoma. CT CERVICAL SPINE FINDINGS Alignment: Normal Skull base and vertebrae: No acute fracture. No primary bone lesion or focal pathologic process. Soft tissues and spinal canal: No prevertebral fluid or swelling. No visible canal hematoma. Disc levels: The spinal canal is quite generous. No large disc protrusions, spinal or foraminal stenosis. Upper chest: The lung apices are grossly clear. Other: Evidence of prior right-sided thyroidectomy. The left thyroid lobe is still in place.  Moderate nodularity with a 13 mm nodule noted. Not clinically significant; no follow-up imaging recommended (ref: J Am Coll Radiol. 2015 Feb;12(2): 143-50). IMPRESSION: 1. Large craniectomy defect involving the right parietal lobe with underlying extensive encephalomalacia in the right parietal lobe. This could be postoperative or posttraumatic change. 2. No acute intracranial findings or skull fracture. 3. Significant left periorbital hematoma. 4. Normal alignment of the cervical vertebral bodies and no acute cervical spine fracture. Electronically Signed   By: Marijo Sanes  M.D.   On: 01/17/2021 13:06   US RENAL  Result Date: 01/17/2021 CLINICAL DATA:  Acute kidney injury. EXAM: RENAL / URINARY TRACT ULTRASOUND COMPLETE COMPARISON:  None recent FINDINGS: Right Kidney: Renal measurements: 8.2 x 3.9 x 3.6 cm = volume: 60 mL. There is increased cortical echogenicity without evidence for hydronephrosis. Left Kidney: Renal measurements: 9 x 3.6 cm =. There is increased cortical echogenicity without evidence for hydronephrosis. There is a 2.1 cm cyst in the interpolar region. Bladder: Appears normal for degree of bladder distention. Other: None. IMPRESSION: 1. No hydronephrosis. 2. Echogenic kidneys bilaterally which can be seen in patients with medical renal disease. Electronically Signed   By: Constance Holster M.D.   On: 01/17/2021 16:06   DG Hand Complete Left  Result Date: 01/17/2021 CLINICAL DATA:  Fall, left and stiffness and swelling, history of CVA with left-sided deficits EXAM: LEFT HAND - COMPLETE 3+ VIEW COMPARISON:  None. FINDINGS: Prominent generalized dorsal left hand soft tissue swelling. No fracture or dislocation. No suspicious focal osseous lesions. No osseous erosions or periosteal reaction. Mild first MCP joint and first carpometacarpal joint osteoarthritis. No radiopaque foreign bodies. IMPRESSION: Prominent generalized dorsal left hand soft tissue swelling. No acute osseous abnormality.  Mild first MCP joint and first carpometacarpal joint osteoarthritis. Electronically Signed   By: Ilona Sorrel M.D.   On: 01/17/2021 12:44   CT Maxillofacial Wo Contrast  Result Date: 01/17/2021 CLINICAL DATA:  Facial trauma. Swelling of the left eye. [place] while going to the bathroom. EXAM: CT MAXILLOFACIAL WITHOUT CONTRAST TECHNIQUE: Multidetector CT imaging of the maxillofacial structures was performed. Multiplanar CT image reconstructions were also generated. COMPARISON:  None. FINDINGS: Osseous: Right-sided parietal craniectomy is noted. No acute facial bone fractures are identified. Orbits: The orbital bones are intact. The globes are intact. No intraorbital hematoma. Sinuses: The paranasal sinuses and mastoid air cells are clear. Soft tissues: Significant left periorbital hematoma. Limited intracranial: No significant findings. Remote posttraumatic or postsurgical changes involving the right parietal lobe. IMPRESSION: 1. Significant left periorbital hematoma but no acute facial bone fractures. 2. Remote posttraumatic or postsurgical changes involving the right parietal lobe. Electronically Signed   By: Marijo Sanes M.D.   On: 01/17/2021 13:00     EKG: I have personally reviewed.  Sinus rhythm, QTC 517, poor R wave progression, borderline left axis deviation   Assessment/Plan Principal Problem:   Fall Active Problems:   Malignant neoplasm of upper-outer quadrant of right breast in female, estrogen receptor positive ([place])   Hypertension   Hypothyroidism   Stroke ([place])   Anemia in chronic kidney disease (CKD)   Acute metabolic encephalopathy   Acute renal failure superimposed on stage 3b chronic kidney disease (HCC)   Type II diabetes mellitus with renal manifestations ([place])    Fall and acute metabolic encephalopathy: Likely multifactorial etiology, including worsening renal function and pain from eye injury. CT of head, CT of C-spine, CT of maxillary image did not show acute intracranial  abnormalities or bony fracture.  Daughter states that patient has difficulty speaking.  Will get MRI to rule out new stroke.  -will place on MedSurg bed for observation -Frequent neuro check -Follow-up MRI of the brain -Treat worsening renal function as below -PT/OT  Malignant neoplasm of upper-outer quadrant of right breast in female, estrogen receptor positive (HCC) -Continue letrozole  Hypertension -IV hydralazine as needed -Patient is on amlodipine, metoprolol, clonidine patch -Hold Cozaar due to worsening renal function  Stroke ([place]) -On aspirin  Hypothyroidism: -Synthroid  Anemia in chronic kidney disease (CKD): Hemoglobin slightly dropped from 9.8 on 10/16/2020 --> 9.3 today -Follow-up of a CBC  Acute renal failure superimposed on stage 3b chronic kidney disease ([place]): Baseline hemoglobin 1.4-1.6 recently.  Her creatinine is 4.72, BUN 38. Likely due to dehydration and continuation of ARB - IVF: 1 L LR bolus was given in ED, then 75 cc/h - Follow up renal function by BMP - Avoid using renal toxic medications, hypotension and contrast dye (or carefully use) - Hold Cozaar - US-renal - Consulted Dr. Juleen China of nephrology  Type II diabetes mellitus with renal manifestations [facility]): Recent A1c 8.0 on 07/07/2017, poorly controlled -Sliding scale insulin -Check A1c      DVT ppx: SCD Code Status: Full code Family Communication:   Yes, patient's wife by phone Disposition Plan:  Anticipate discharge back to previous environment Consults called:  none Admission status and Level of care: Med-Surg:  obs   Status is: Observation  The patient remains OBS appropriate and will d/c before 2 midnights.  Dispo: The patient is from: Home              Anticipated d/c is to: Home              Anticipated d/c date is: 1 day              Patient currently is not medically stable to d/c.   Difficult to place patient No          Date of Service 01/17/2021    Ivor Costa [facility]   If 7PM-7AM, please contact night-coverage [email or web address] 01/17/2021, 6:20 PM

## 2021-01-17 NOTE — ED Provider Notes (Signed)
[facility] Emergency Department Provider Note ____________________________________________   Event Date/Time   First MD Initiated Contact with Patient 01/17/21 1152     (approximate)  I have reviewed the triage vital signs and the nursing notes.  HISTORY  Chief Complaint Fall   HPI Kirsten Mcmahon is a 75 y.o. femalewho presents to the ED for evaluation of fall.   Chart review indicates ASA 81 is only thinner.  HTN, HLD, DM. Wheelchair-bound with chronic left-sided weakness from remote stroke.  Recently established with nephrology due to CKD.  Patient reports that she fell yesterday while transferring between wheelchair and toilet, causing her to land onto her face on the bathroom floor.  She reports her son urged her to come to the ED for evaluation today due to continued swelling around her left eye.  Patient is tearful and describes that she does not know why she fell.  She reports that she always has swelling to her left hand after her remote stroke and does not think she fell onto it.  Past Medical History:  Diagnosis Date  . Cancer [facility]) 2017   Right breast  . Depression   . GERD (gastroesophageal reflux disease)   . Glaucoma   . Hemiparesis ([place])    left side  . High cholesterol   . History of seizure    x 1 - after a spider bite  . History of stroke with residual deficit    left-side weakness  . Hypertension    states BP under control with meds., has been on med. x 2 yr.  . Hypothyroidism   . Non-insulin dependent type 2 diabetes mellitus ([place])   . Overactive bladder   . Stroke [facility])    1998 weakness on left side    Patient Active Problem List   Diagnosis Date Noted  . Fall 01/17/2021  . Hypertension   . Hypothyroidism   . Stroke ([place])   . GERD (gastroesophageal reflux disease)   . Anemia in chronic kidney disease (CKD)   . Acute metabolic encephalopathy   . Acute renal failure superimposed on stage 3b chronic kidney  disease ([place])   . Type II diabetes mellitus with renal manifestations ([place])   . Abnormal mammogram of left breast 07/30/2018  . Closed displaced oblique fracture of shaft of left humerus 03/14/2018  . Osteopenia 01/20/2018  . Multiple thyroid nodules 07/13/2017  . Tracheal deviation 07/13/2017  . Malignant neoplasm of upper-outer quadrant of right breast in female, estrogen receptor positive ([place]) 01/20/2017  . Primary vulvar squamous cell carcinoma ([place]) 01/20/2017    Past Surgical History:  Procedure Laterality Date  . ABDOMINAL HYSTERECTOMY     complete  . BREAST BIOPSY Left 08/03/2018   Benign adipose tissue  . BREAST EXCISIONAL BIOPSY Right 2014   Positive  . BREAST LUMPECTOMY Right   . CATARACT EXTRACTION W/ INTRAOCULAR LENS IMPLANT Left   . CEREBRAL ANEURYSM REPAIR  1998  . PICC LINE INSERTION    . THYROID LOBECTOMY Right 07/15/2017   Procedure: RIGHT THYROID LOBECTOMY;  Surgeon: Armandina Gemma, MD;  Location: [place];  Service: General;  Laterality: Right;    Prior to Admission medications   Medication Sig Start Date End Date Taking? Authorizing Provider  alendronate (FOSAMAX) 70 MG tablet Take 1 tablet (70 mg total) by mouth once a week. Take with a full glass of water on an empty stomach. 09/07/18   Thersa Salt G, DO  amLODipine (NORVASC) 5 MG tablet Take 1  tablet (5 mg total) by mouth daily. 09/07/18   Coral Spikes, DO  aspirin EC 81 MG tablet Take 81 mg by mouth daily.    [provider]  chlorthalidone (HYGROTON) 25 MG tablet Take 1 tablet (25 mg total) by mouth daily. 09/07/18   Coral Spikes, DO  cholecalciferol (VITAMIN D) 1000 units tablet Take 1,000 Units by mouth daily.    [provider]  latanoprost (XALATAN) 0.005 % ophthalmic solution Place 1 drop into both eyes at bedtime. 09/07/18   Coral Spikes, DO  letrozole [facility]) 2.5 MG tablet Take 1 tablet (2.5 mg total) by mouth daily. 02/16/19   Nicholas Lose, MD  levothyroxine (SYNTHROID, LEVOTHROID) 112  MCG tablet Take 1 tablet (112 mcg total) by mouth daily before breakfast. 09/07/18   Coral Spikes, DO  losartan (COZAAR) 100 MG tablet Take 1 tablet (100 mg total) by mouth daily. 09/07/18   Coral Spikes, DO  metFORMIN (GLUCOPHAGE-XR) 500 MG 24 hr tablet Take 1 tablet (500 mg total) by mouth daily. 09/07/18   Coral Spikes, DO  mirabegron ER (MYRBETRIQ) 25 MG TB24 tablet Take 1 tablet (25 mg total) by mouth daily. 09/07/18   Coral Spikes, DO  Probiotic Product (PROBIOTIC DAILY PO) Take by mouth.    [provider]  ranitidine (ZANTAC) 75 MG tablet Take 75 mg by mouth daily.    [provider]  traMADol (ULTRAM) 50 MG tablet Take 1-2 tablets (50-100 mg total) by mouth every 6 (six) hours as needed for moderate pain. 08/03/18   Armandina Gemma, MD    Allergies Levemir [insulin detemir]  Family History  Problem Relation Age of Onset  . Breast cancer Neg Hx     Social History Social History   Tobacco Use  . Smoking status: Never Smoker  . Smokeless tobacco: Never Used  Vaping Use  . Vaping Use: Never used  Substance Use Topics  . Alcohol use: No  . Drug use: No    Review of Systems  Constitutional: No fever/chills Eyes: No visual changes. ENT: No sore throat. Cardiovascular: Denies chest pain. Respiratory: Denies shortness of breath. Gastrointestinal: No abdominal pain.  No nausea, no vomiting.  No diarrhea.  No constipation. Genitourinary: Negative for dysuria. Musculoskeletal: Negative for back pain.  Positive for fall. Skin: Negative for rash. Neurological: Negative for headaches, focal weakness or numbness.  ____________________________________________   PHYSICAL EXAM:  VITAL SIGNS: Vitals:   01/17/21 1137 01/17/21 1300  BP: (!) 150/88 (!) 149/82  Pulse: 98 94  Resp: 16 19  Temp: 98.7 F (37.1 C)   SpO2: 100% 97%     Constitutional: Alert and oriented.  Appears uncomfortable.  Obvious swelling to left eyelid and periorbital region.  Answers  questions appropriately.  Follows commands in the right side at baseline. Eyes: Conjunctivae are normal. PERRL. EOMI. Head: Diffuse soft tissue swelling to the left upper eyelid and periorbital region.  No discrete laceration or evidence of open injury.  No bleeding.  I am able to gently pry open her eyelids to visualize PERRL pupils with acuity intact.  No subconjunctival hemorrhages noted. no apparent proptosis.. Nose: No congestion/rhinnorhea. Mouth/Throat: Mucous membranes are moist.  Oropharynx non-erythematous. Neck: No stridor. No cervical spine tenderness to palpation. Cardiovascular: Normal rate, regular rhythm. Grossly normal heart sounds.  Good peripheral circulation. Respiratory: Normal respiratory effort.  No retractions. Lungs CTAB. Gastrointestinal: Soft , nondistended, nontender to palpation. No CVA tenderness. Musculoskeletal: No lower extremity tenderness nor edema.  No joint effusions. No signs of acute trauma. Neurologic: Left-sided arm and leg weakness, reportedly at the baseline per the patient.  No evidence of acute neurologic deficits. Skin:  Skin is warm, dry and intact. No rash noted. Psychiatric: Mood and affect are normal. Speech and behavior are normal.  ____________________________________________   LABS (all labs ordered are listed, but only abnormal results are displayed)  Labs Reviewed  CBC WITH DIFFERENTIAL/PLATELET - Abnormal; Notable for the following components:      Result Value   RBC 3.21 (*)    Hemoglobin 9.3 (*)    HCT 29.3 (*)    Abs Immature Granulocytes 0.09 (*)    All other components within normal limits  COMPREHENSIVE METABOLIC PANEL - Abnormal; Notable for the following components:   CO2 19 (*)    Glucose, Bld 108 (*)    BUN 38 (*)    Creatinine, Ser 4.72 (*)    Calcium 8.7 (*)    Albumin 3.2 (*)    GFR, Estimated 9 (*)    All other components within normal limits  SARS CORONAVIRUS 2 (TAT 6-24 HRS)  TROPONIN I (HIGH SENSITIVITY)    ____________________________________________  12 Lead EKG  Sinus rhythm, rate of 107 bpm.  Normal axis.  QTC 517 ms and otherwise normal intervals.  No evidence of acute ischemia. ____________________________________________  RADIOLOGY  ED MD interpretation: CT head reviewed by me without evidence of acute intracranial pathology.  Official radiology report(s): CT Head Wo Contrast  Result Date: 01/17/2021 CLINICAL DATA:  [place].  Hit head. EXAM: CT HEAD WITHOUT CONTRAST CT CERVICAL SPINE WITHOUT CONTRAST TECHNIQUE: Multidetector CT imaging of the head and cervical spine was performed following the standard protocol without intravenous contrast. Multiplanar CT image reconstructions of the cervical spine were also generated. COMPARISON:  None. FINDINGS: CT HEAD FINDINGS Brain: Large craniectomy defect involving the right parietal lobe. There is underlying extensive encephalomalacia in the right parietal lobe. This could be postoperative or posttraumatic change. The ventricles are in the midline without mass effect or shift. Mild ex vacuo dilatation of the right lateral ventricle. No acute extra-axial fluid collections are identified. No CT findings to suggest an acute hemispheric infarction or intracranial hemorrhage. Fairly extensive periventricular white matter disease and moderate to advanced cerebral atrophy. The brainstem and cerebellum are grossly normal.  Mild atrophy. Vascular: Vascular calcifications but no aneurysm or hyperdense vessels. Skull: Right craniectomy defect but no acute fracture or bone lesions. Sinuses/Orbits: The paranasal sinuses and mastoid air cells are clear. The globes are intact. Other: Significant left periorbital hematoma. CT CERVICAL SPINE FINDINGS Alignment: Normal Skull base and vertebrae: No acute fracture. No primary bone lesion or focal pathologic process. Soft tissues and spinal canal: No prevertebral fluid or swelling. No visible canal hematoma. Disc levels: The  spinal canal is quite generous. No large disc protrusions, spinal or foraminal stenosis. Upper chest: The lung apices are grossly clear. Other: Evidence of prior right-sided thyroidectomy. The left thyroid lobe is still in place. Moderate nodularity with a 13 mm nodule noted. Not clinically significant; no follow-up imaging recommended (ref: J Am Coll Radiol. 2015 Feb;12(2): 143-50). IMPRESSION: 1. Large craniectomy defect involving the right parietal lobe with underlying extensive encephalomalacia in the right parietal lobe. This could be postoperative or posttraumatic change. 2. No acute intracranial findings or skull fracture. 3. Significant left periorbital hematoma. 4. Normal alignment of the cervical vertebral bodies and no acute cervical spine fracture. Electronically Signed   By: Ricky Stabs.D.  On: 01/17/2021 13:06   CT Cervical Spine Wo Contrast  Result Date: 01/17/2021 CLINICAL DATA:  [place].  Hit head. EXAM: CT HEAD WITHOUT CONTRAST CT CERVICAL SPINE WITHOUT CONTRAST TECHNIQUE: Multidetector CT imaging of the head and cervical spine was performed following the standard protocol without intravenous contrast. Multiplanar CT image reconstructions of the cervical spine were also generated. COMPARISON:  None. FINDINGS: CT HEAD FINDINGS Brain: Large craniectomy defect involving the right parietal lobe. There is underlying extensive encephalomalacia in the right parietal lobe. This could be postoperative or posttraumatic change. The ventricles are in the midline without mass effect or shift. Mild ex vacuo dilatation of the right lateral ventricle. No acute extra-axial fluid collections are identified. No CT findings to suggest an acute hemispheric infarction or intracranial hemorrhage. Fairly extensive periventricular white matter disease and moderate to advanced cerebral atrophy. The brainstem and cerebellum are grossly normal.  Mild atrophy. Vascular: Vascular calcifications but no aneurysm or  hyperdense vessels. Skull: Right craniectomy defect but no acute fracture or bone lesions. Sinuses/Orbits: The paranasal sinuses and mastoid air cells are clear. The globes are intact. Other: Significant left periorbital hematoma. CT CERVICAL SPINE FINDINGS Alignment: Normal Skull base and vertebrae: No acute fracture. No primary bone lesion or focal pathologic process. Soft tissues and spinal canal: No prevertebral fluid or swelling. No visible canal hematoma. Disc levels: The spinal canal is quite generous. No large disc protrusions, spinal or foraminal stenosis. Upper chest: The lung apices are grossly clear. Other: Evidence of prior right-sided thyroidectomy. The left thyroid lobe is still in place. Moderate nodularity with a 13 mm nodule noted. Not clinically significant; no follow-up imaging recommended (ref: J Am Coll Radiol. 2015 Feb;12(2): 143-50). IMPRESSION: 1. Large craniectomy defect involving the right parietal lobe with underlying extensive encephalomalacia in the right parietal lobe. This could be postoperative or posttraumatic change. 2. No acute intracranial findings or skull fracture. 3. Significant left periorbital hematoma. 4. Normal alignment of the cervical vertebral bodies and no acute cervical spine fracture. Electronically Signed   By: Marijo Sanes M.D.   On: 01/17/2021 13:06   DG Hand Complete Left  Result Date: 01/17/2021 CLINICAL DATA:  Fall, left and stiffness and swelling, history of CVA with left-sided deficits EXAM: LEFT HAND - COMPLETE 3+ VIEW COMPARISON:  None. FINDINGS: Prominent generalized dorsal left hand soft tissue swelling. No fracture or dislocation. No suspicious focal osseous lesions. No osseous erosions or periosteal reaction. Mild first MCP joint and first carpometacarpal joint osteoarthritis. No radiopaque foreign bodies. IMPRESSION: Prominent generalized dorsal left hand soft tissue swelling. No acute osseous abnormality. Mild first MCP joint and first  carpometacarpal joint osteoarthritis. Electronically Signed   By: Ilona Sorrel M.D.   On: 01/17/2021 12:44   CT Maxillofacial Wo Contrast  Result Date: 01/17/2021 CLINICAL DATA:  Facial trauma. Swelling of the left eye. [place] while going to the bathroom. EXAM: CT MAXILLOFACIAL WITHOUT CONTRAST TECHNIQUE: Multidetector CT imaging of the maxillofacial structures was performed. Multiplanar CT image reconstructions were also generated. COMPARISON:  None. FINDINGS: Osseous: Right-sided parietal craniectomy is noted. No acute facial bone fractures are identified. Orbits: The orbital bones are intact. The globes are intact. No intraorbital hematoma. Sinuses: The paranasal sinuses and mastoid air cells are clear. Soft tissues: Significant left periorbital hematoma. Limited intracranial: No significant findings. Remote posttraumatic or postsurgical changes involving the right parietal lobe. IMPRESSION: 1. Significant left periorbital hematoma but no acute facial bone fractures. 2. Remote posttraumatic or postsurgical changes involving the right parietal lobe. Electronically  Signed   By: Marijo Sanes M.D.   On: 01/17/2021 13:00    ____________________________________________   PROCEDURES and INTERVENTIONS  Procedure(s) performed (including Critical Care):  Procedures  Medications  hydrALAZINE (APRESOLINE) injection 5 mg (has no administration in time range)  insulin aspart (novoLOG) injection 0-5 Units (has no administration in time range)  insulin aspart (novoLOG) injection 0-9 Units (has no administration in time range)  lactated ringers bolus 1,000 mL (1,000 mLs Intravenous New Bag/Given 01/17/21 1345)    ____________________________________________   MDM / ED COURSE   75 year old woman presents to the ED for evaluation after a mechanical fall, with evidence of AKI on CKD, repetitive questioning and confusion concerning for concussion, and requiring observation medical admission.  Normal vitals  on room air.  Exam with her left eye swollen shut, but no evidence of underlying ocular pathology.  No evidence of proptosis or underlying hemorrhage to require lateral canthotomy.  She has chronic left-sided deficits, reportedly at her baseline, from remote stroke.  Blood work demonstrates AKI on CKD with progressively worsening renal function, for which she received a liter of LR.  On multiple reassessments, patient is confused, asked repetitive questioning and I am concerned about the possibility of concussion.  She reports that she lives with family, but I am unable to get in touch with anybody.  Due to concern for unsafe discharge plan, AKI on CKD and her continued confusion and repetitive questioning, we will discussed the case with hospitalist for medical admission.   Clinical Course as of 01/17/21 1435  Sat Jan 17, 2021  1328 Called son.  No response and no voicemail availability. [DS]  5102 Reassessed.  Clinically similar.  Continues to be confused and asking repetitive questioning.  We discussed worsening CKD, confusion and changing story suggestive of possible concussion and continued left eye swollen shut.  We discussed observation medical admission, patient is agreeable. [DS]    Clinical Course User Index [DS] Vladimir Crofts, MD    ____________________________________________   FINAL CLINICAL IMPRESSION(S) / ED DIAGNOSES  Final diagnoses:  Fall, initial encounter  Injury of head, initial encounter  AKI (acute kidney injury) [facility])     ED Discharge Orders    None       Beverley Allender Tamala Julian   Note:  This document was prepared using Dragon voice recognition software and may include unintentional dictation errors.   Vladimir Crofts, MD 01/17/21 [id number]

## 2021-01-17 NOTE — Consult Note (Signed)
[name]  CONSULT NOTE    Date: 01/17/2021                  Patient Name:  Kirsten Mcmahon  MRN: [id number]  DOB: 1946-08-11  Age / Sex: 75 y.o., female         PCP: System, Provider Not In                 Service Requesting Consult: Dr. Blaine Hamper                 Reason for Consult: Acute kidney injury versus progression of chronic kidney disease stage V.             History of Present Illness: Kirsten Mcmahon  Admitted post fall. Patient is confused and with anxiety. She is wheelchair bound and has left sided hemiparesis. Patient fell in the bathroom and hit her head.   Patient was recently seen by [facility] for chronic kidney disease stage V.    Medications: Outpatient medications: (Not in a hospital admission)   Current medications: Current Facility-Administered Medications  Medication Dose Route Frequency Provider Last Rate Last Admin  . hydrALAZINE (APRESOLINE) injection 5 mg  5 mg Intravenous Q2H PRN Ivor Costa, MD      . insulin aspart (novoLOG) injection 0-5 Units  0-5 Units Subcutaneous QHS Ivor Costa, MD      . insulin aspart (novoLOG) injection 0-9 Units  0-9 Units Subcutaneous TID WC Ivor Costa, MD      . lactated ringers infusion   Intravenous Continuous Ivor Costa, MD       Current Outpatient Medications  Medication Sig Dispense Refill  . alendronate (FOSAMAX) 70 MG tablet Take 1 tablet (70 mg total) by mouth once a week. Take with a full glass of water on an empty stomach. 12 tablet 0  . amLODipine (NORVASC) 2.5 MG tablet Take 2.5 mg by mouth daily. Take one tablet (2.5 mg) by mouth along with one (5 mg) tablet for total 7.5 mg once daily    . amLODipine (NORVASC) 5 MG tablet Take 1 tablet (5 mg total) by mouth daily. (Patient taking differently: Take 5 mg by mouth daily. Take one tablet (5 mg) by mouth along with one (2.5 mg) tablet for total 7.5 mg once daily) 90 tablet 0  . aspirin EC 81 MG tablet Take 81 mg by mouth daily.     . cholecalciferol (VITAMIN D) 1000 units tablet Take 1,000 Units by mouth daily.    Marland Kitchen latanoprost (XALATAN) 0.005 % ophthalmic solution Place 1 drop into both eyes at bedtime. 2.5 mL 0  . letrozole (FEMARA) 2.5 MG tablet Take 1 tablet (2.5 mg total) by mouth daily. 90 tablet 3  . levothyroxine (SYNTHROID, LEVOTHROID) 112 MCG tablet Take 1 tablet (112 mcg total) by mouth daily before breakfast. 90 tablet 0  . losartan (COZAAR) 100 MG tablet Take 1 tablet (100 mg total) by mouth daily. 90 tablet 0  . metoprolol succinate (TOPROL-XL) 25 MG 24 hr tablet Take 12.5 mg by mouth daily.    . mirabegron ER (MYRBETRIQ) 25 MG TB24 tablet Take 1 tablet (25 mg total) by mouth daily. 90 tablet 0  . cloNIDine (CATAPRES - DOSED IN MG/24 HR) 0.2 mg/24hr patch Place 0.2 mg onto the skin once a week. Wednesday    . loperamide (IMODIUM) 2 MG capsule Take 2 mg by mouth 2 (two) times daily as needed for diarrhea or loose stools.    Marland Kitchen  meclizine (ANTIVERT) 25 MG tablet Take 25 mg by mouth daily as needed for dizziness.    . Probiotic Product (PROBIOTIC DAILY PO) Take by mouth. (Patient not taking: No sig reported)    . TYLENOL 325 MG tablet Take 650 mg by mouth daily as needed for pain.        Allergies: Allergies  Allergen Reactions  . Levemir [Insulin Detemir] Itching      Past Medical History: Past Medical History:  Diagnosis Date  . Cancer [facility]) 2017   Right breast  . Depression   . GERD (gastroesophageal reflux disease)   . Glaucoma   . Hemiparesis ([place])    left side  . High cholesterol   . History of seizure    x 1 - after a spider bite  . History of stroke with residual deficit    left-side weakness  . Hypertension    states BP under control with meds., has been on med. x 2 yr.  . Hypothyroidism   . Non-insulin dependent type 2 diabetes mellitus ([place])   . Overactive bladder   . Stroke [facility])    1998 weakness on left side     Past Surgical History: Past Surgical History:  Procedure  Laterality Date  . ABDOMINAL HYSTERECTOMY     complete  . BREAST BIOPSY Left 08/03/2018   Benign adipose tissue  . BREAST EXCISIONAL BIOPSY Right 2014   Positive  . BREAST LUMPECTOMY Right   . CATARACT EXTRACTION W/ INTRAOCULAR LENS IMPLANT Left   . CEREBRAL ANEURYSM REPAIR  1998  . PICC LINE INSERTION    . THYROID LOBECTOMY Right 07/15/2017   Procedure: RIGHT THYROID LOBECTOMY;  Surgeon: Armandina Gemma, MD;  Location: [facility] OR;  Service: General;  Laterality: Right;     Family History: Family History  Problem Relation Age of Onset  . Breast cancer Neg Hx      Social History: Social History   Socioeconomic History  . Marital status: Single    Spouse name: Not on file  . Number of children: Not on file  . Years of education: Not on file  . Highest education level: Not on file  Occupational History  . Not on file  Tobacco Use  . Smoking status: Never Smoker  . Smokeless tobacco: Never Used  Vaping Use  . Vaping Use: Never used  Substance and Sexual Activity  . Alcohol use: No  . Drug use: No  . Sexual activity: Never    Birth control/protection: Post-menopausal  Other Topics Concern  . Not on file  Social History Narrative   Lives with daughter    Social Determinants of Health   Financial Resource Strain: Not on file  Food Insecurity: Not on file  Transportation Needs: Not on file  Physical Activity: Not on file  Stress: Not on file  Social Connections: Not on file  Intimate Partner Violence: Not on file     Review of Systems: Review of Systems  Unable to perform ROS: Dementia    Vital Signs: Blood pressure (!) 150/94, pulse 95, temperature 98.7 F (37.1 C), temperature source Oral, resp. rate (!) 25, height 5\' 2"  (1.575 m), weight 72 kg, SpO2 100 %.  Weight trends: Danley Danker Weights   01/17/21 1136 01/17/21 1143  Weight: 72.6 kg 72 kg    Physical Exam: General: NAD, laying in stretcher  Head: Left orbital ecchymosis   Eyes: Anicteric, PERRL  Neck:  Supple, trachea midline  Lungs:  Clear to auscultation  Heart: Regular  rate and rhythm  Abdomen:  Soft, nontender,   Extremities: no peripheral edema.  Neurologic: Nonfocal, moving all four extremities  Skin: No lesions  Access: none     Lab results: Basic Metabolic Panel: Recent Labs  Lab 01/17/21 1202  NA 142  K 4.0  CL 108  CO2 19*  GLUCOSE 108*  BUN 38*  CREATININE 4.72*  CALCIUM 8.7*    Liver Function Tests: Recent Labs  Lab 01/17/21 1202  AST 19  ALT 10  ALKPHOS 70  BILITOT 0.6  PROT 7.9  ALBUMIN 3.2*   No results for input(s): LIPASE, AMYLASE in the last 168 hours. No results for input(s): AMMONIA in the last 168 hours.  CBC: Recent Labs  Lab 01/17/21 1202  WBC 10.3  NEUTROABS 7.7  HGB 9.3*  HCT 29.3*  MCV 91.3  PLT 290    Cardiac Enzymes: No results for input(s): CKTOTAL, CKMB, CKMBINDEX, TROPONINI in the last 168 hours.  BNP: Invalid input(s): POCBNP  CBG: No results for input(s): GLUCAP in the last 168 hours.  Microbiology: No results found for this or any previous visit.  Coagulation Studies: No results for input(s): LABPROT, INR in the last 72 hours.  Urinalysis: No results for input(s): COLORURINE, LABSPEC, PHURINE, GLUCOSEU, HGBUR, BILIRUBINUR, KETONESUR, PROTEINUR, UROBILINOGEN, NITRITE, LEUKOCYTESUR in the last 72 hours.  Invalid input(s): APPERANCEUR    Imaging: CT Head Wo Contrast  Result Date: 01/17/2021 CLINICAL DATA:  [place].  Hit head. EXAM: CT HEAD WITHOUT CONTRAST CT CERVICAL SPINE WITHOUT CONTRAST TECHNIQUE: Multidetector CT imaging of the head and cervical spine was performed following the standard protocol without intravenous contrast. Multiplanar CT image reconstructions of the cervical spine were also generated. COMPARISON:  None. FINDINGS: CT HEAD FINDINGS Brain: Large craniectomy defect involving the right parietal lobe. There is underlying extensive encephalomalacia in the right parietal lobe. This could be  postoperative or posttraumatic change. The ventricles are in the midline without mass effect or shift. Mild ex vacuo dilatation of the right lateral ventricle. No acute extra-axial fluid collections are identified. No CT findings to suggest an acute hemispheric infarction or intracranial hemorrhage. Fairly extensive periventricular white matter disease and moderate to advanced cerebral atrophy. The brainstem and cerebellum are grossly normal.  Mild atrophy. Vascular: Vascular calcifications but no aneurysm or hyperdense vessels. Skull: Right craniectomy defect but no acute fracture or bone lesions. Sinuses/Orbits: The paranasal sinuses and mastoid air cells are clear. The globes are intact. Other: Significant left periorbital hematoma. CT CERVICAL SPINE FINDINGS Alignment: Normal Skull base and vertebrae: No acute fracture. No primary bone lesion or focal pathologic process. Soft tissues and spinal canal: No prevertebral fluid or swelling. No visible canal hematoma. Disc levels: The spinal canal is quite generous. No large disc protrusions, spinal or foraminal stenosis. Upper chest: The lung apices are grossly clear. Other: Evidence of prior right-sided thyroidectomy. The left thyroid lobe is still in place. Moderate nodularity with a 13 mm nodule noted. Not clinically significant; no follow-up imaging recommended (ref: J Am Coll Radiol. 2015 Feb;12(2): 143-50). IMPRESSION: 1. Large craniectomy defect involving the right parietal lobe with underlying extensive encephalomalacia in the right parietal lobe. This could be postoperative or posttraumatic change. 2. No acute intracranial findings or skull fracture. 3. Significant left periorbital hematoma. 4. Normal alignment of the cervical vertebral bodies and no acute cervical spine fracture. Electronically Signed   By: Marijo Sanes M.D.   On: 01/17/2021 13:06   CT Cervical Spine Wo Contrast  Result Date: 01/17/2021 CLINICAL  DATA:  [place].  Hit head. EXAM: CT HEAD  WITHOUT CONTRAST CT CERVICAL SPINE WITHOUT CONTRAST TECHNIQUE: Multidetector CT imaging of the head and cervical spine was performed following the standard protocol without intravenous contrast. Multiplanar CT image reconstructions of the cervical spine were also generated. COMPARISON:  None. FINDINGS: CT HEAD FINDINGS Brain: Large craniectomy defect involving the right parietal lobe. There is underlying extensive encephalomalacia in the right parietal lobe. This could be postoperative or posttraumatic change. The ventricles are in the midline without mass effect or shift. Mild ex vacuo dilatation of the right lateral ventricle. No acute extra-axial fluid collections are identified. No CT findings to suggest an acute hemispheric infarction or intracranial hemorrhage. Fairly extensive periventricular white matter disease and moderate to advanced cerebral atrophy. The brainstem and cerebellum are grossly normal.  Mild atrophy. Vascular: Vascular calcifications but no aneurysm or hyperdense vessels. Skull: Right craniectomy defect but no acute fracture or bone lesions. Sinuses/Orbits: The paranasal sinuses and mastoid air cells are clear. The globes are intact. Other: Significant left periorbital hematoma. CT CERVICAL SPINE FINDINGS Alignment: Normal Skull base and vertebrae: No acute fracture. No primary bone lesion or focal pathologic process. Soft tissues and spinal canal: No prevertebral fluid or swelling. No visible canal hematoma. Disc levels: The spinal canal is quite generous. No large disc protrusions, spinal or foraminal stenosis. Upper chest: The lung apices are grossly clear. Other: Evidence of prior right-sided thyroidectomy. The left thyroid lobe is still in place. Moderate nodularity with a 13 mm nodule noted. Not clinically significant; no follow-up imaging recommended (ref: J Am Coll Radiol. 2015 Feb;12(2): 143-50). IMPRESSION: 1. Large craniectomy defect involving the right parietal lobe with  underlying extensive encephalomalacia in the right parietal lobe. This could be postoperative or posttraumatic change. 2. No acute intracranial findings or skull fracture. 3. Significant left periorbital hematoma. 4. Normal alignment of the cervical vertebral bodies and no acute cervical spine fracture. Electronically Signed   By: Marijo Sanes M.D.   On: 01/17/2021 13:06   US RENAL  Result Date: 01/17/2021 CLINICAL DATA:  Acute kidney injury. EXAM: RENAL / URINARY TRACT ULTRASOUND COMPLETE COMPARISON:  None recent FINDINGS: Right Kidney: Renal measurements: 8.2 x 3.9 x 3.6 cm = volume: 60 mL. There is increased cortical echogenicity without evidence for hydronephrosis. Left Kidney: Renal measurements: 9 x 3.6 cm =. There is increased cortical echogenicity without evidence for hydronephrosis. There is a 2.1 cm cyst in the interpolar region. Bladder: Appears normal for degree of bladder distention. Other: None. IMPRESSION: 1. No hydronephrosis. 2. Echogenic kidneys bilaterally which can be seen in patients with medical renal disease. Electronically Signed   By: Constance Holster M.D.   On: 01/17/2021 16:06   DG Hand Complete Left  Result Date: 01/17/2021 CLINICAL DATA:  Fall, left and stiffness and swelling, history of CVA with left-sided deficits EXAM: LEFT HAND - COMPLETE 3+ VIEW COMPARISON:  None. FINDINGS: Prominent generalized dorsal left hand soft tissue swelling. No fracture or dislocation. No suspicious focal osseous lesions. No osseous erosions or periosteal reaction. Mild first MCP joint and first carpometacarpal joint osteoarthritis. No radiopaque foreign bodies. IMPRESSION: Prominent generalized dorsal left hand soft tissue swelling. No acute osseous abnormality. Mild first MCP joint and first carpometacarpal joint osteoarthritis. Electronically Signed   By: Ilona Sorrel M.D.   On: 01/17/2021 12:44   CT Maxillofacial Wo Contrast  Result Date: 01/17/2021 CLINICAL DATA:  Facial trauma. Swelling  of the left eye. [place] while going to the bathroom. EXAM: CT  MAXILLOFACIAL WITHOUT CONTRAST TECHNIQUE: Multidetector CT imaging of the maxillofacial structures was performed. Multiplanar CT image reconstructions were also generated. COMPARISON:  None. FINDINGS: Osseous: Right-sided parietal craniectomy is noted. No acute facial bone fractures are identified. Orbits: The orbital bones are intact. The globes are intact. No intraorbital hematoma. Sinuses: The paranasal sinuses and mastoid air cells are clear. Soft tissues: Significant left periorbital hematoma. Limited intracranial: No significant findings. Remote posttraumatic or postsurgical changes involving the right parietal lobe. IMPRESSION: 1. Significant left periorbital hematoma but no acute facial bone fractures. 2. Remote posttraumatic or postsurgical changes involving the right parietal lobe. Electronically Signed   By: Marijo Sanes M.D.   On: 01/17/2021 13:00      Assessment & Plan: Kirsten Mcmahon is a 75 y.o. black female with hypertension, CVA with residual left sided weakness, diabetes mellitus type II, hypothyroidism, hyperlipidemia, glaucoma, depression, seizure disorder and history of breast cancer who was admitted to [facility] on 01/17/2021 for Fall [W19.XXXA]  1. Chronic kidney disease stage V: baseline creatinine of 3.97, GFR of 10 on 10/16/2020. Followed by [facility], Dr. Radene Knee.  No acute indication for dialysis  2. Metabolic acidosis  3. Anemia of chronic kidney disease: hemoglobin of 9.3.    LOS: 0 Briton Sellman 2/12/20225:17 PM

## 2021-01-17 NOTE — ED Triage Notes (Addendum)
Pt to ED POV for chief complaint of fall. Swelling noted to left eye and left hand, pt states this is not normal.  States she fell while going to the bathroom, cannot state why.  Pt very slow to answer questions and appears to be having trouble, crying in triage. Denies blood thinner use, but unsure if pt is poor historian   Attempted to contact daughter who brought pt in to assist with questions, no answer when called.   Discussed pt with Dr Charna Archer

## 2021-01-17 NOTE — ED Notes (Addendum)
Pt daughter tearful, states her mothers speech is just not normal, speech unchanged from time of arrival to ER, daughter  very concerned for another stroke.  Advised pt that I would contact provider so that they could discuss further evaluation.

## 2021-01-18 ENCOUNTER — Encounter: Payer: Self-pay | Admitting: Internal Medicine

## 2021-01-18 ENCOUNTER — Other Ambulatory Visit: Payer: Self-pay

## 2021-01-18 ENCOUNTER — Inpatient Hospital Stay: Payer: Medicare (Managed Care)

## 2021-01-18 DIAGNOSIS — I120 Hypertensive chronic kidney disease with stage 5 chronic kidney disease or end stage renal disease: Secondary | ICD-10-CM | POA: Diagnosis present

## 2021-01-18 DIAGNOSIS — Z20822 Contact with and (suspected) exposure to covid-19: Secondary | ICD-10-CM | POA: Diagnosis present

## 2021-01-18 DIAGNOSIS — N1832 Chronic kidney disease, stage 3b: Secondary | ICD-10-CM | POA: Diagnosis not present

## 2021-01-18 DIAGNOSIS — W050XXA Fall from non-moving wheelchair, initial encounter: Secondary | ICD-10-CM | POA: Diagnosis present

## 2021-01-18 DIAGNOSIS — I69354 Hemiplegia and hemiparesis following cerebral infarction affecting left non-dominant side: Secondary | ICD-10-CM | POA: Diagnosis not present

## 2021-01-18 DIAGNOSIS — Z170 Estrogen receptor positive status [ER+]: Secondary | ICD-10-CM | POA: Diagnosis not present

## 2021-01-18 DIAGNOSIS — R569 Unspecified convulsions: Secondary | ICD-10-CM | POA: Diagnosis present

## 2021-01-18 DIAGNOSIS — F419 Anxiety disorder, unspecified: Secondary | ICD-10-CM | POA: Diagnosis present

## 2021-01-18 DIAGNOSIS — M7989 Other specified soft tissue disorders: Secondary | ICD-10-CM | POA: Diagnosis present

## 2021-01-18 DIAGNOSIS — E872 Acidosis: Secondary | ICD-10-CM | POA: Diagnosis present

## 2021-01-18 DIAGNOSIS — N3281 Overactive bladder: Secondary | ICD-10-CM | POA: Diagnosis present

## 2021-01-18 DIAGNOSIS — W19XXXA Unspecified fall, initial encounter: Secondary | ICD-10-CM | POA: Diagnosis not present

## 2021-01-18 DIAGNOSIS — H409 Unspecified glaucoma: Secondary | ICD-10-CM | POA: Diagnosis present

## 2021-01-18 DIAGNOSIS — Z7982 Long term (current) use of aspirin: Secondary | ICD-10-CM | POA: Diagnosis not present

## 2021-01-18 DIAGNOSIS — S0990XA Unspecified injury of head, initial encounter: Secondary | ICD-10-CM

## 2021-01-18 DIAGNOSIS — E039 Hypothyroidism, unspecified: Secondary | ICD-10-CM | POA: Diagnosis present

## 2021-01-18 DIAGNOSIS — E785 Hyperlipidemia, unspecified: Secondary | ICD-10-CM | POA: Diagnosis present

## 2021-01-18 DIAGNOSIS — N179 Acute kidney failure, unspecified: Secondary | ICD-10-CM | POA: Diagnosis present

## 2021-01-18 DIAGNOSIS — E860 Dehydration: Secondary | ICD-10-CM | POA: Diagnosis present

## 2021-01-18 DIAGNOSIS — R4701 Aphasia: Secondary | ICD-10-CM | POA: Diagnosis present

## 2021-01-18 DIAGNOSIS — K219 Gastro-esophageal reflux disease without esophagitis: Secondary | ICD-10-CM | POA: Diagnosis present

## 2021-01-18 DIAGNOSIS — D631 Anemia in chronic kidney disease: Secondary | ICD-10-CM | POA: Diagnosis present

## 2021-01-18 DIAGNOSIS — E11649 Type 2 diabetes mellitus with hypoglycemia without coma: Secondary | ICD-10-CM | POA: Diagnosis not present

## 2021-01-18 DIAGNOSIS — G9341 Metabolic encephalopathy: Secondary | ICD-10-CM | POA: Diagnosis present

## 2021-01-18 DIAGNOSIS — N182 Chronic kidney disease, stage 2 (mild): Secondary | ICD-10-CM | POA: Diagnosis not present

## 2021-01-18 DIAGNOSIS — Z993 Dependence on wheelchair: Secondary | ICD-10-CM | POA: Diagnosis not present

## 2021-01-18 DIAGNOSIS — C50411 Malignant neoplasm of upper-outer quadrant of right female breast: Secondary | ICD-10-CM | POA: Diagnosis present

## 2021-01-18 DIAGNOSIS — N185 Chronic kidney disease, stage 5: Secondary | ICD-10-CM | POA: Diagnosis present

## 2021-01-18 DIAGNOSIS — E1122 Type 2 diabetes mellitus with diabetic chronic kidney disease: Secondary | ICD-10-CM | POA: Diagnosis present

## 2021-01-18 DIAGNOSIS — T17908A Unspecified foreign body in respiratory tract, part unspecified causing other injury, initial encounter: Secondary | ICD-10-CM | POA: Diagnosis not present

## 2021-01-18 DIAGNOSIS — N171 Acute kidney failure with acute cortical necrosis: Secondary | ICD-10-CM | POA: Diagnosis not present

## 2021-01-18 LAB — FERRITIN: Ferritin: 57 ng/mL (ref 11–307)

## 2021-01-18 LAB — BASIC METABOLIC PANEL
Anion gap: 12 (ref 5–15)
BUN: 39 mg/dL — ABNORMAL HIGH (ref 8–23)
CO2: 20 mmol/L — ABNORMAL LOW (ref 22–32)
Calcium: 8.4 mg/dL — ABNORMAL LOW (ref 8.9–10.3)
Chloride: 110 mmol/L (ref 98–111)
Creatinine, Ser: 4.37 mg/dL — ABNORMAL HIGH (ref 0.44–1.00)
GFR, Estimated: 10 mL/min — ABNORMAL LOW (ref >60)
Glucose, Bld: 120 mg/dL — ABNORMAL HIGH (ref 70–99)
Potassium: 4 mmol/L (ref 3.5–5.1)
Sodium: 142 mmol/L (ref 135–145)

## 2021-01-18 LAB — HEPATIC FUNCTION PANEL
ALT: 12 U/L (ref 0–44)
AST: 20 U/L (ref 15–41)
Albumin: 3.2 g/dL — ABNORMAL LOW (ref 3.5–5.0)
Alkaline Phosphatase: 65 U/L (ref 38–126)
Bilirubin, Direct: <0.1 mg/dL (ref 0.0–0.2)
Total Bilirubin: 0.7 mg/dL (ref 0.3–1.2)
Total Protein: 7.7 g/dL (ref 6.5–8.1)

## 2021-01-18 LAB — CK: Total CK: 204 U/L (ref 38–234)

## 2021-01-18 LAB — IRON AND TIBC
Iron: 33 ug/dL (ref 28–170)
Saturation Ratios: 12 % (ref 10.4–31.8)
TIBC: 274 ug/dL (ref 250–450)
UIBC: 241 ug/dL

## 2021-01-18 LAB — GLUCOSE, CAPILLARY
Glucose-Capillary: 108 mg/dL — ABNORMAL HIGH (ref 70–99)
Glucose-Capillary: 153 mg/dL — ABNORMAL HIGH (ref 70–99)
Glucose-Capillary: 113 mg/dL — ABNORMAL HIGH (ref 70–99)
Glucose-Capillary: 92 mg/dL (ref 70–99)

## 2021-01-18 LAB — RENAL FUNCTION PANEL
Albumin: 3.1 g/dL — ABNORMAL LOW (ref 3.5–5.0)
Anion gap: 13 (ref 5–15)
BUN: 39 mg/dL — ABNORMAL HIGH (ref 8–23)
CO2: 20 mmol/L — ABNORMAL LOW (ref 22–32)
Calcium: 8.5 mg/dL — ABNORMAL LOW (ref 8.9–10.3)
Chloride: 109 mmol/L (ref 98–111)
Creatinine, Ser: 4.38 mg/dL — ABNORMAL HIGH (ref 0.44–1.00)
GFR, Estimated: 10 mL/min — ABNORMAL LOW (ref >60)
Glucose, Bld: 118 mg/dL — ABNORMAL HIGH (ref 70–99)
Phosphorus: 4.5 mg/dL (ref 2.5–4.6)
Potassium: 4 mmol/L (ref 3.5–5.1)
Sodium: 142 mmol/L (ref 135–145)

## 2021-01-18 LAB — HEPATITIS B SURFACE ANTIGEN
Hepatitis B Surface Ag: NONREACTIVE
Hepatitis B Surface Ag: NONREACTIVE

## 2021-01-18 LAB — CBC WITH DIFFERENTIAL/PLATELET
Abs Immature Granulocytes: 0.08 10*3/uL — ABNORMAL HIGH (ref 0.00–0.07)
Basophils Absolute: 0 10*3/uL (ref 0.0–0.1)
Basophils Relative: 0 %
Eosinophils Absolute: 0.1 10*3/uL (ref 0.0–0.5)
Eosinophils Relative: 1 %
HCT: 27.9 % — ABNORMAL LOW (ref 36.0–46.0)
Hemoglobin: 9 g/dL — ABNORMAL LOW (ref 12.0–15.0)
Immature Granulocytes: 1 %
Lymphocytes Relative: 9 %
Lymphs Abs: 1 10*3/uL (ref 0.7–4.0)
MCH: 29.4 pg (ref 26.0–34.0)
MCHC: 32.3 g/dL (ref 30.0–36.0)
MCV: 91.2 fL (ref 80.0–100.0)
Monocytes Absolute: 0.4 10*3/uL (ref 0.1–1.0)
Monocytes Relative: 4 %
Neutro Abs: 9.5 10*3/uL — ABNORMAL HIGH (ref 1.7–7.7)
Neutrophils Relative %: 85 %
Platelets: 246 10*3/uL (ref 150–400)
RBC: 3.06 MIL/uL — ABNORMAL LOW (ref 3.87–5.11)
RDW: 14.4 % (ref 11.5–15.5)
WBC: 11.1 10*3/uL — ABNORMAL HIGH (ref 4.0–10.5)
nRBC: 0 % (ref 0.0–0.2)

## 2021-01-18 LAB — HEPATITIS B SURFACE ANTIBODY,QUALITATIVE: Hep B S Ab: NONREACTIVE

## 2021-01-18 LAB — HEMOGLOBIN A1C
Hgb A1c MFr Bld: 5.7 % — ABNORMAL HIGH (ref 4.8–5.6)
Mean Plasma Glucose: 116.89 mg/dL

## 2021-01-18 LAB — SARS CORONAVIRUS 2 (TAT 6-24 HRS): SARS Coronavirus 2: NEGATIVE

## 2021-01-18 LAB — HEPATITIS C ANTIBODY: HCV Ab: NONREACTIVE

## 2021-01-18 LAB — HEPATITIS B CORE ANTIBODY, IGM: Hep B C IgM: NONREACTIVE

## 2021-01-18 IMAGING — DX DG CHEST 1V PORT
1 series · 1 of 1 positions shown · non-contrast
Comparison: [DATE] radiographs

CLINICAL DATA: Aspiration. Stroke. Hypertension. Right breast
cancer.

EXAM:
PORTABLE CHEST 1 VIEW

[chest ap]
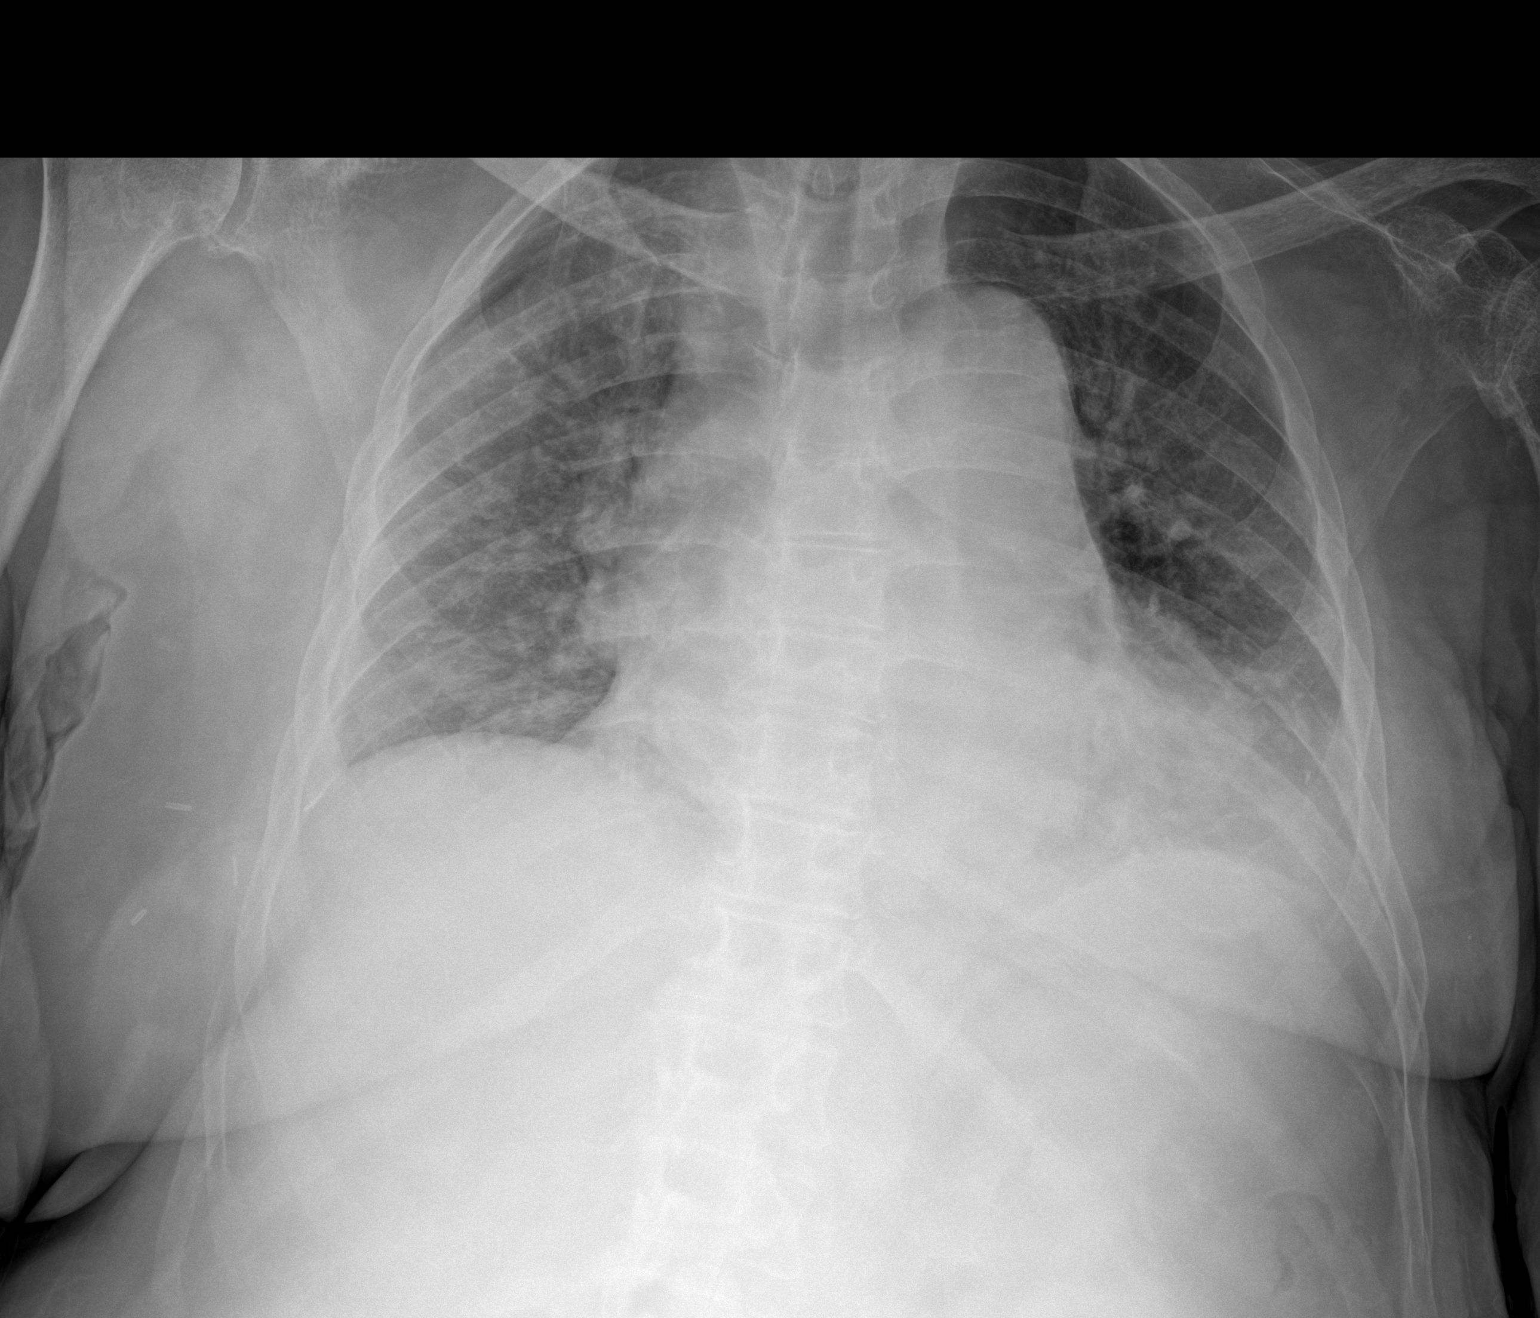

[1 of 1 positions shown; findings below may reference images not displayed]

FINDINGS: Low lung volumes are present, causing crowding of the pulmonary
vasculature. Tortuous thoracic aorta. Indistinct margin of the left
cardiac border, difficult to exclude a mild lingular airspace
opacity.

Clips along the right lower neck and right axilla. Left greater than
right degenerative glenohumeral arthropathy. Indistinct pulmonary
vasculature, possibly from pulmonary venous hypertension. No Kerley
B lines to suggest overt interstitial edema. No appreciable blunting
of the costophrenic angles.
IMPRESSION: 1. Indistinct margin of the left cardiac border, difficult to
exclude a mild lingular pneumonia although a prominent epicardial
fat pad can sometimes cause a similar appearance.
2. Mild enlargement of the cardiopericardial silhouette with
indistinct pulmonary vasculature, possibly from pulmonary venous
hypertension. No overt edema.
3. Low lung volumes.
4. Left greater than right degenerative glenohumeral arthropathy.1

## 2021-01-18 MED ORDER — LORAZEPAM 2 MG/ML IJ SOLN
1.0000 mg | Freq: Once | INTRAMUSCULAR | Status: DC
  Filled 2021-01-18: qty 1

## 2021-01-18 MED ORDER — LORAZEPAM 2 MG/ML IJ SOLN: 2.0000 mg | INTRAMUSCULAR | Status: DC | PRN

## 2021-01-18 MED ORDER — LORAZEPAM 2 MG/ML IJ SOLN: 1.0000 mg | Freq: Once | INTRAMUSCULAR | Status: DC

## 2021-01-18 NOTE — Progress Notes (Signed)
RR, upon arrival patient was being cared for by staff. Patient was being sent for a CT. Nursing staff will contact family.

## 2021-01-18 NOTE — Progress Notes (Signed)
PT Cancellation Note  Patient Details Name: Kirsten Mcmahon MRN: [id number] DOB: Oct 24, 1946   Cancelled Treatment:    Reason Eval/Treat Not Completed: Medical issues which prohibited therapy. Will hold PT evaluation for today given rapid response called after questionable seizure activity this morning. PT to follow up as appropriate.   Minna Merritts, PT, MPT  Percell Locus 01/18/2021, 3:50 PM

## 2021-01-18 NOTE — Significant Event (Addendum)
Rapid Response Event Note   Reason for Call : called RRT for questionable seizure activity, aspiration, urinary incontinence with incident... previous stroke patient.   Initial Focused Assessment: Laying in bed, VSS, pt non verbal trying to cough/expel food. More alert as time passing, tracks with eyes.       Interventions: Dr Wyline Copas to bedside. NPO pending SLP eval, neuro consult (MD to MD) and EEG.   Plan of Care: See above interventions. Cathi Roan, RN to call if further assistance needed.    Event Summary:   MD Notified: [phone] Call [place] A, RN

## 2021-01-18 NOTE — Progress Notes (Addendum)
PROGRESS NOTE    Kirsten Mcmahon  ZOX:[id number] DOB: [birth year] DOA: 01/17/2021 PCP: System, Provider Not In    Brief Narrative:  75 y.o. female with medical history significant of hypertension, hyperlipidemia, diabetes mellitus, stroke with left-sided weakness, wheelchair-bound, GERD, hypothyroidism, depression, breast cancer, CKD stage IIIb, who presents with a fall.  In the ED, pt was found to have WBC 18.3, COVID-19 PCR was neg, troponin level 7, worsening renal function, temperature normal, blood pressure 149/82, heart rate 98, RR 19, oxygen saturation 97% on room air.  X-ray of left hand is negative for bony fracture.  CT of the C-spine is negative for acute bony fracture. Nephrology was consulted  Assessment & Plan:   Principal Problem:   Fall Active Problems:   Malignant neoplasm of upper-outer quadrant of right breast in female, estrogen receptor positive (Kirsten Mcmahon)   Hypertension   Hypothyroidism   Stroke (Kirsten Mcmahon)   Anemia in chronic kidney disease (CKD)   Acute metabolic encephalopathy   Acute renal failure superimposed on stage 3b chronic kidney disease (HCC)   Type II diabetes mellitus with renal manifestations (Kirsten Mcmahon)    Fall and acute metabolic encephalopathy: Likely multifactorial etiology, including worsening renal function and pain from eye injury. CT of head, CT of C-spine, CT of maxillary image did not show acute intracranial abnormalities or bony fracture.   -MRI brain was obtained and reviewed with Neurologist. Neg for acute infarct -PT/OT consulted, pending eval  Malignant neoplasm of upper-outer quadrant of right breast in female, estrogen receptor positive (Kirsten Mcmahon) -Continue letrozole as tolerated  Hypertension -BP overall stable at this time -Patient is continued on amlodipine, metoprolol, clonidine patch -Continue with PRN hydralazine -Cozaar is on hold secondary to presenting worsened renal function  Prior hx of Stroke [facility]) -continue on  aspirin  Hypothyroidism: -continue with synthroid -Will check TSH in AM  Anemia in chronic kidney disease (CKD):  -Hemoglobin slightly dropped from 9.8 on 10/16/2020 --> 9.3 at time of presentation -Hgb currently 9.0 today -Repeat cbc in AM  Acute renal failure superimposed on stage 3b chronic kidney disease (Kirsten Mcmahon): Baseline hemoglobin 1.4-1.6 recently.  Presenting creatinine of 4.72, BUN 38. Likely due to dehydration and continuation of ARB - Pt is continued on IVF hydration - cont to avoid nephrotoxic agents - Continue to hold Cozaar as per above - US-renal reviewed. No hydronephrosis seen. Evidence of medical renal disease noted - Nephrology is following  Type II diabetes mellitus with renal manifestations [facility]): Recent A1c 8.0 on 07/07/2017, poorly controlled -Continue with sliding scale insulin -Hgb A1c pending  Possible Seizure vs Aspiration event -Please see RRT documentation. This AM, while being fed, pt had a period of apparent unresponsiveness and groaning that was transient -Unclear if seizure event of if pt aspirated while eating -Have discussed case with Neurology on call. Recommendation for EEG when available and close monitoring for now.  -MRI brain was reviewed with Neurologist over phone. If episode re-occurs or if EEG is abnormal, would then formally consult neurology  UPDATE: -Discussed with pt's son over phone. Pt's baseline state prior to fall was communicating and interacting seeming "normally" per family  DVT prophylaxis: SCD's Code Status: Full Family Communication: Pt in room  Status is: Observation  The patient will require care spanning > 2 midnights and should be moved to inpatient because: Unsafe d/c plan and Inpatient level of care appropriate due to severity of illness  Dispo: The patient is from: Home  Anticipated d/c is to: Unclear at this time. Pending PT/OT eval              Anticipated d/c date is: 3 days              Patient  currently is not medically stable to d/c.   Difficult to place patient No  Consultants:   Nephrology  Discussed case with Neurology over phone  Procedures:     Antimicrobials: Anti-infectives (From admission, onward)   None       Subjective: Difficult to assess as pt is not very verbal  Objective: Vitals:   01/18/21 0423 01/18/21 0751 01/18/21 1125 01/18/21 1549  BP: 125/81 (!) 152/88 130/77 (!) 141/90  Pulse: (!) 101 (!) 102 (!) 101 94  Resp: 20 20 18 18   Temp: 98 F (36.7 C) 98.6 F (37 C) 97.9 F (36.6 C) 99.6 F (37.6 C)  TempSrc: Oral  Oral Oral  SpO2: 100% 100% 96% 100%  Weight:      Height:        Intake/Output Summary (Last 24 hours) at 01/18/2021 1648 Last data filed at 01/18/2021 1000 Gross per 24 hour  Intake 1769.03 ml  Output 650 ml  Net 1119.03 ml   Filed Weights   01/17/21 1136 01/17/21 1143  Weight: 72.6 kg 72 kg    Examination:  General exam: Appears calm and comfortable  Respiratory system: Clear to auscultation. Respiratory effort normal. Cardiovascular system: S1 & S2 heard, Regular Gastrointestinal system: Abdomen is nondistended, soft and nontender. No organomegaly or masses felt. Normal bowel sounds heard. Central nervous system: Alert. No focal neurological deficits. Extremities: Symmetric 5 x 5 power. Skin: No rashes, lesions Psychiatry: Difficult to assess given mentation.   Data Reviewed: I have personally reviewed following labs and imaging studies  CBC: Recent Labs  Lab 01/17/21 1202 01/18/21 0451  WBC 10.3 11.1*  NEUTROABS 7.7 9.5*  HGB 9.3* 9.0*  HCT 29.3* 27.9*  MCV 91.3 91.2  PLT 290 025   Basic Metabolic Panel: Recent Labs  Lab 01/17/21 1202 01/18/21 0451  NA 142 142  142  K 4.0 4.0  4.0  CL 108 110  109  CO2 19* 20*  20*  GLUCOSE 108* 120*  118*  BUN 38* 39*  39*  CREATININE 4.72* 4.37*  4.38*  CALCIUM 8.7* 8.4*  8.5*  PHOS  --  4.5   GFR: Estimated Creatinine Clearance: 10.2 mL/min  (A) (by C-G formula based on SCr of 4.38 mg/dL (H)). Liver Function Tests: Recent Labs  Lab 01/17/21 1202 01/18/21 0451  AST 19 20  ALT 10 12  ALKPHOS 70 65  BILITOT 0.6 0.7  PROT 7.9 7.7  ALBUMIN 3.2* 3.2*  3.1*   No results for input(s): LIPASE, AMYLASE in the last 168 hours. No results for input(s): AMMONIA in the last 168 hours. Coagulation Profile: No results for input(s): INR, PROTIME in the last 168 hours. Cardiac Enzymes: Recent Labs  Lab 01/18/21 0451  CKTOTAL 204   BNP (last 3 results) No results for input(s): PROBNP in the last 8760 hours. HbA1C: No results for input(s): HGBA1C in the last 72 hours. CBG: Recent Labs  Lab 01/17/21 1737 01/17/21 2102 01/18/21 0753 01/18/21 1133 01/18/21 1631  GLUCAP 158* 109* 108* 153* 113*   Lipid Profile: No results for input(s): CHOL, HDL, LDLCALC, TRIG, CHOLHDL, LDLDIRECT in the last 72 hours. Thyroid Function Tests: No results for input(s): TSH, T4TOTAL, FREET4, T3FREE, THYROIDAB in the last 72 hours. Anemia  Panel: Recent Labs    01/18/21 0451  FERRITIN 57  TIBC 274  IRON 33   Sepsis Labs: No results for input(s): PROCALCITON, LATICACIDVEN in the last 168 hours.  Recent Results (from the past 240 hour(s))  SARS CORONAVIRUS 2 (TAT 6-24 HRS) Nasopharyngeal Nasopharyngeal Swab     Status: None   Collection Time: 01/17/21  2:24 PM   Specimen: Nasopharyngeal Swab  Result Value Ref Range Status   SARS Coronavirus 2 NEGATIVE NEGATIVE Final    Comment: (NOTE) SARS-CoV-2 target nucleic acids are NOT DETECTED.  The SARS-CoV-2 RNA is generally detectable in upper and lower respiratory specimens during the acute phase of infection. Negative results do not preclude SARS-CoV-2 infection, do not rule out co-infections with other pathogens, and should not be used as the sole basis for treatment or other patient management decisions. Negative results must be combined with clinical observations, patient history, and  epidemiological information. The expected result is Negative.  Fact Sheet for Patients: SugarRoll.be  Fact Sheet for Healthcare Providers: https://www.woods-mathews.com/  This test is not yet approved or cleared by the [place] FDA and  has been authorized for detection and/or diagnosis of SARS-CoV-2 by FDA under an Emergency Use Authorization (EUA). This EUA will remain  in effect (meaning this test can be used) for the duration of the COVID-19 declaration under Se ction 564(b)(1) of the Act, 21 U.S.C. section 360bbb-3(b)(1), unless the authorization is terminated or revoked sooner.  Performed at [facility], [place] [address], [place], [place] [address]      Radiology Studies: CT Head Wo Contrast  Result Date: 01/17/2021 CLINICAL DATA:  [place].  Hit head. EXAM: CT HEAD WITHOUT CONTRAST CT CERVICAL SPINE WITHOUT CONTRAST TECHNIQUE: Multidetector CT imaging of the head and cervical spine was performed following the standard protocol without intravenous contrast. Multiplanar CT image reconstructions of the cervical spine were also generated. COMPARISON:  None. FINDINGS: CT HEAD FINDINGS Brain: Large craniectomy defect involving the right parietal lobe. There is underlying extensive encephalomalacia in the right parietal lobe. This could be postoperative or posttraumatic change. The ventricles are in the midline without mass effect or shift. Mild ex vacuo dilatation of the right lateral ventricle. No acute extra-axial fluid collections are identified. No CT findings to suggest an acute hemispheric infarction or intracranial hemorrhage. Fairly extensive periventricular white matter disease and moderate to advanced cerebral atrophy. The brainstem and cerebellum are grossly normal.  Mild atrophy. Vascular: Vascular calcifications but no aneurysm or hyperdense vessels. Skull: Right craniectomy defect but no acute fracture or bone lesions.  Sinuses/Orbits: The paranasal sinuses and mastoid air cells are clear. The globes are intact. Other: Significant left periorbital hematoma. CT CERVICAL SPINE FINDINGS Alignment: Normal Skull base and vertebrae: No acute fracture. No primary bone lesion or focal pathologic process. Soft tissues and spinal canal: No prevertebral fluid or swelling. No visible canal hematoma. Disc levels: The spinal canal is quite generous. No large disc protrusions, spinal or foraminal stenosis. Upper chest: The lung apices are grossly clear. Other: Evidence of prior right-sided thyroidectomy. The left thyroid lobe is still in place. Moderate nodularity with a 13 mm nodule noted. Not clinically significant; no follow-up imaging recommended (ref: J Am Coll Radiol. 2015 Feb;12(2): 143-50). IMPRESSION: 1. Large craniectomy defect involving the right parietal lobe with underlying extensive encephalomalacia in the right parietal lobe. This could be postoperative or posttraumatic change. 2. No acute intracranial findings or skull fracture. 3. Significant left periorbital hematoma. 4. Normal alignment of the cervical  vertebral bodies and no acute cervical spine fracture. Electronically Signed   By: Marijo Sanes M.D.   On: 01/17/2021 13:06   CT Cervical Spine Wo Contrast  Result Date: 01/17/2021 CLINICAL DATA:  [place].  Hit head. EXAM: CT HEAD WITHOUT CONTRAST CT CERVICAL SPINE WITHOUT CONTRAST TECHNIQUE: Multidetector CT imaging of the head and cervical spine was performed following the standard protocol without intravenous contrast. Multiplanar CT image reconstructions of the cervical spine were also generated. COMPARISON:  None. FINDINGS: CT HEAD FINDINGS Brain: Large craniectomy defect involving the right parietal lobe. There is underlying extensive encephalomalacia in the right parietal lobe. This could be postoperative or posttraumatic change. The ventricles are in the midline without mass effect or shift. Mild ex vacuo dilatation of  the right lateral ventricle. No acute extra-axial fluid collections are identified. No CT findings to suggest an acute hemispheric infarction or intracranial hemorrhage. Fairly extensive periventricular white matter disease and moderate to advanced cerebral atrophy. The brainstem and cerebellum are grossly normal.  Mild atrophy. Vascular: Vascular calcifications but no aneurysm or hyperdense vessels. Skull: Right craniectomy defect but no acute fracture or bone lesions. Sinuses/Orbits: The paranasal sinuses and mastoid air cells are clear. The globes are intact. Other: Significant left periorbital hematoma. CT CERVICAL SPINE FINDINGS Alignment: Normal Skull base and vertebrae: No acute fracture. No primary bone lesion or focal pathologic process. Soft tissues and spinal canal: No prevertebral fluid or swelling. No visible canal hematoma. Disc levels: The spinal canal is quite generous. No large disc protrusions, spinal or foraminal stenosis. Upper chest: The lung apices are grossly clear. Other: Evidence of prior right-sided thyroidectomy. The left thyroid lobe is still in place. Moderate nodularity with a 13 mm nodule noted. Not clinically significant; no follow-up imaging recommended (ref: J Am Coll Radiol. 2015 Feb;12(2): 143-50). IMPRESSION: 1. Large craniectomy defect involving the right parietal lobe with underlying extensive encephalomalacia in the right parietal lobe. This could be postoperative or posttraumatic change. 2. No acute intracranial findings or skull fracture. 3. Significant left periorbital hematoma. 4. Normal alignment of the cervical vertebral bodies and no acute cervical spine fracture. Electronically Signed   By: Marijo Sanes M.D.   On: 01/17/2021 13:06   MR BRAIN WO CONTRAST  Result Date: 01/17/2021 CLINICAL DATA:  Hypertension and diabetes. Left-sided weakness. Fell today. EXAM: MRI HEAD WITHOUT CONTRAST TECHNIQUE: Multiplanar, multiecho pulse sequences of the brain and surrounding  structures were obtained without intravenous contrast. COMPARISON:  Head CT earlier same day. FINDINGS: Brain: The study suffers from motion degradation. Diffusion imaging does not show any acute or subacute infarction. Old small vessel infarctions affect the cerebellum on both sides. Chronic small-vessel ischemic change of the pons. Wallerian degeneration on the right. Old small vessel infarctions of the thalami. Chronic small-vessel ischemic changes of the cerebral hemispheric white matter. Old right insular and frontal operculum infarction with atrophy, encephalomalacia and gliosis. No evidence of recent hemorrhage, hydrocephalus or extra-axial collection. Vascular: Major vessels at the base of the brain show flow. Skull and upper cervical spine: Large right frontal craniotomy. Sinuses/Orbits: Clear/normal Other: None IMPRESSION: No acute finding by MRI. Extensive chronic small-vessel ischemic changes throughout the brain as outlined above. Old right insular and frontal operculum infarction with atrophy, encephalomalacia and gliosis. Wallerian degeneration on the right. Old cerebellar infarctions. Electronically Signed   By: Nelson Chimes M.D.   On: 01/17/2021 23:21   US RENAL  Result Date: 01/17/2021 CLINICAL DATA:  Acute kidney injury. EXAM: RENAL / URINARY TRACT ULTRASOUND COMPLETE  COMPARISON:  None recent FINDINGS: Right Kidney: Renal measurements: 8.2 x 3.9 x 3.6 cm = volume: 60 mL. There is increased cortical echogenicity without evidence for hydronephrosis. Left Kidney: Renal measurements: 9 x 3.6 cm =. There is increased cortical echogenicity without evidence for hydronephrosis. There is a 2.1 cm cyst in the interpolar region. Bladder: Appears normal for degree of bladder distention. Other: None. IMPRESSION: 1. No hydronephrosis. 2. Echogenic kidneys bilaterally which can be seen in patients with medical renal disease. Electronically Signed   By: Constance Holster M.D.   On: 01/17/2021 16:06   DG  Hand Complete Left  Result Date: 01/17/2021 CLINICAL DATA:  Fall, left and stiffness and swelling, history of CVA with left-sided deficits EXAM: LEFT HAND - COMPLETE 3+ VIEW COMPARISON:  None. FINDINGS: Prominent generalized dorsal left hand soft tissue swelling. No fracture or dislocation. No suspicious focal osseous lesions. No osseous erosions or periosteal reaction. Mild first MCP joint and first carpometacarpal joint osteoarthritis. No radiopaque foreign bodies. IMPRESSION: Prominent generalized dorsal left hand soft tissue swelling. No acute osseous abnormality. Mild first MCP joint and first carpometacarpal joint osteoarthritis. Electronically Signed   By: Ilona Sorrel M.D.   On: 01/17/2021 12:44   CT Maxillofacial Wo Contrast  Result Date: 01/17/2021 CLINICAL DATA:  Facial trauma. Swelling of the left eye. [place] while going to the bathroom. EXAM: CT MAXILLOFACIAL WITHOUT CONTRAST TECHNIQUE: Multidetector CT imaging of the maxillofacial structures was performed. Multiplanar CT image reconstructions were also generated. COMPARISON:  None. FINDINGS: Osseous: Right-sided parietal craniectomy is noted. No acute facial bone fractures are identified. Orbits: The orbital bones are intact. The globes are intact. No intraorbital hematoma. Sinuses: The paranasal sinuses and mastoid air cells are clear. Soft tissues: Significant left periorbital hematoma. Limited intracranial: No significant findings. Remote posttraumatic or postsurgical changes involving the right parietal lobe. IMPRESSION: 1. Significant left periorbital hematoma but no acute facial bone fractures. 2. Remote posttraumatic or postsurgical changes involving the right parietal lobe. Electronically Signed   By: Marijo Sanes M.D.   On: 01/17/2021 13:00    Scheduled Meds: . amLODipine  7.5 mg Oral Daily  . aspirin EC  81 mg Oral Daily  . cholecalciferol  1,000 Units Oral Daily  . insulin aspart  0-5 Units Subcutaneous QHS  . insulin aspart   0-9 Units Subcutaneous TID WC  . latanoprost  1 drop Both Eyes QHS  . letrozole  2.5 mg Oral Daily  . levothyroxine  112 mcg Oral Q0600  . metoprolol succinate  12.5 mg Oral Daily   Continuous Infusions: . lactated ringers 75 mL/hr at 01/18/21 1145     LOS: 0 days   Marylu Lund, MD [facility] Pager On Amion  If 7PM-7AM, please contact night-coverage 01/18/2021, 4:48 PM

## 2021-01-18 NOTE — Progress Notes (Signed)
[name]  ROUNDING NOTE   Subjective:   Patient experienced seizure like activity with post ictal state.  Daughter came to bedside shortly afterward. She reports that patient has had a seizure before but it has been "years" reports that she has not been on any antiepileptics.  Patient does open eyes to stimuli.   Patient seen by [facility] nephrology as an outpatient.  Patients daughter and son are requesting the results of her MRI from yesterday,.   Objective:  Vital signs in last 24 hours:  Temp:  [97.8 F (36.6 C)-98.7 F (37.1 C)] 98.6 F (37 C) (02/13 0751) Pulse Rate:  [91-102] 102 (02/13 0751) Resp:  [14-25] 20 (02/13 0751) BP: (125-160)/(75-101) 152/88 (02/13 0751) SpO2:  [97 %-100 %] 100 % (02/13 0751) Weight:  [72 kg-72.6 kg] 72 kg (02/12 1143)  Weight change:  Filed Weights   01/17/21 1136 01/17/21 1143  Weight: 72.6 kg 72 kg    Intake/Output: I/O last 3 completed shifts: In: 6010 [P.O.:120; I.V.:649; IV Piggyback:1000] Out: 400 [Urine:400]   Intake/Output this shift:  No intake/output data recorded.  Physical Exam: General: Lying in bed.   Head:  Swelling around left eye. Moist oral mucosal membranes  Eyes: Anicteric, PERRL,  Neck: Supple, trachea midline  Lungs:  Clear to auscultation, occasional cough   Heart: Regular rate and rhythm  Abdomen:  Soft, nontender,   Extremities:  No peripheral edema.  Neurologic: Pupils equal, opens eyes to stimuli  Skin: No lesions       Basic Metabolic Panel: Recent Labs  Lab 01/17/21 1202 01/18/21 0451  NA 142 142  142  K 4.0 4.0  4.0  CL 108 110  109  CO2 19* 20*  20*  GLUCOSE 108* 120*  118*  BUN 38* 39*  39*  CREATININE 4.72* 4.37*  4.38*  CALCIUM 8.7* 8.4*  8.5*  PHOS  --  4.5    Liver Function Tests: Recent Labs  Lab 01/17/21 1202 01/18/21 0451  AST 19 20  ALT 10 12  ALKPHOS 70 65  BILITOT 0.6 0.7  PROT 7.9 7.7  ALBUMIN 3.2* 3.2*  3.1*   No results for input(s):  LIPASE, AMYLASE in the last 168 hours. No results for input(s): AMMONIA in the last 168 hours.  CBC: Recent Labs  Lab 01/17/21 1202 01/18/21 0451  WBC 10.3 11.1*  NEUTROABS 7.7 9.5*  HGB 9.3* 9.0*  HCT 29.3* 27.9*  MCV 91.3 91.2  PLT 290 246    Cardiac Enzymes: Recent Labs  Lab 01/18/21 0451  CKTOTAL 204    BNP: Invalid input(s): POCBNP  CBG: Recent Labs  Lab 01/17/21 1737 01/17/21 2102 01/18/21 0753  GLUCAP 158* 109* 108*    Microbiology: Results for orders placed or performed during the hospital encounter of 01/17/21  SARS CORONAVIRUS 2 (TAT 6-24 HRS) Nasopharyngeal Nasopharyngeal Swab     Status: None   Collection Time: 01/17/21  2:24 PM   Specimen: Nasopharyngeal Swab  Result Value Ref Range Status   SARS Coronavirus 2 NEGATIVE NEGATIVE Final    Comment: (NOTE) SARS-CoV-2 target nucleic acids are NOT DETECTED.  The SARS-CoV-2 RNA is generally detectable in upper and lower respiratory specimens during the acute phase of infection. Negative results do not preclude SARS-CoV-2 infection, do not rule out co-infections with other pathogens, and should not be used as the sole basis for treatment or other patient management decisions. Negative results must be combined with clinical observations, patient history, and epidemiological information. The expected result  is Negative.  Fact Sheet for Patients: SugarRoll.be  Fact Sheet for Healthcare Providers: https://www.woods-mathews.com/  This test is not yet approved or cleared by the [place] FDA and  has been authorized for detection and/or diagnosis of SARS-CoV-2 by FDA under an Emergency Use Authorization (EUA). This EUA will remain  in effect (meaning this test can be used) for the duration of the COVID-19 declaration under Se ction 564(b)(1) of the Act, 21 U.S.C. section 360bbb-3(b)(1), unless the authorization is terminated or revoked sooner.  Performed at  [facility], [place] [address], [place], [place] [address]     Coagulation Studies: No results for input(s): LABPROT, INR in the last 72 hours.  Urinalysis: Recent Labs    01/17/21 1733  COLORURINE STRAW*  LABSPEC 1.009  PHURINE 6.0  GLUCOSEU NEGATIVE  HGBUR SMALL*  BILIRUBINUR NEGATIVE  KETONESUR NEGATIVE  PROTEINUR 100*  NITRITE NEGATIVE  LEUKOCYTESUR NEGATIVE      Imaging: CT Head Wo Contrast  Result Date: 01/17/2021 CLINICAL DATA:  [place].  Hit head. EXAM: CT HEAD WITHOUT CONTRAST CT CERVICAL SPINE WITHOUT CONTRAST TECHNIQUE: Multidetector CT imaging of the head and cervical spine was performed following the standard protocol without intravenous contrast. Multiplanar CT image reconstructions of the cervical spine were also generated. COMPARISON:  None. FINDINGS: CT HEAD FINDINGS Brain: Large craniectomy defect involving the right parietal lobe. There is underlying extensive encephalomalacia in the right parietal lobe. This could be postoperative or posttraumatic change. The ventricles are in the midline without mass effect or shift. Mild ex vacuo dilatation of the right lateral ventricle. No acute extra-axial fluid collections are identified. No CT findings to suggest an acute hemispheric infarction or intracranial hemorrhage. Fairly extensive periventricular white matter disease and moderate to advanced cerebral atrophy. The brainstem and cerebellum are grossly normal.  Mild atrophy. Vascular: Vascular calcifications but no aneurysm or hyperdense vessels. Skull: Right craniectomy defect but no acute fracture or bone lesions. Sinuses/Orbits: The paranasal sinuses and mastoid air cells are clear. The globes are intact. Other: Significant left periorbital hematoma. CT CERVICAL SPINE FINDINGS Alignment: Normal Skull base and vertebrae: No acute fracture. No primary bone lesion or focal pathologic process. Soft tissues and spinal canal: No prevertebral fluid or swelling. No visible  canal hematoma. Disc levels: The spinal canal is quite generous. No large disc protrusions, spinal or foraminal stenosis. Upper chest: The lung apices are grossly clear. Other: Evidence of prior right-sided thyroidectomy. The left thyroid lobe is still in place. Moderate nodularity with a 13 mm nodule noted. Not clinically significant; no follow-up imaging recommended (ref: J Am Coll Radiol. 2015 Feb;12(2): 143-50). IMPRESSION: 1. Large craniectomy defect involving the right parietal lobe with underlying extensive encephalomalacia in the right parietal lobe. This could be postoperative or posttraumatic change. 2. No acute intracranial findings or skull fracture. 3. Significant left periorbital hematoma. 4. Normal alignment of the cervical vertebral bodies and no acute cervical spine fracture. Electronically Signed   By: Marijo Sanes M.D.   On: 01/17/2021 13:06   CT Cervical Spine Wo Contrast  Result Date: 01/17/2021 CLINICAL DATA:  [place].  Hit head. EXAM: CT HEAD WITHOUT CONTRAST CT CERVICAL SPINE WITHOUT CONTRAST TECHNIQUE: Multidetector CT imaging of the head and cervical spine was performed following the standard protocol without intravenous contrast. Multiplanar CT image reconstructions of the cervical spine were also generated. COMPARISON:  None. FINDINGS: CT HEAD FINDINGS Brain: Large craniectomy defect involving the right parietal lobe. There is underlying extensive encephalomalacia in the right parietal lobe.  This could be postoperative or posttraumatic change. The ventricles are in the midline without mass effect or shift. Mild ex vacuo dilatation of the right lateral ventricle. No acute extra-axial fluid collections are identified. No CT findings to suggest an acute hemispheric infarction or intracranial hemorrhage. Fairly extensive periventricular white matter disease and moderate to advanced cerebral atrophy. The brainstem and cerebellum are grossly normal.  Mild atrophy. Vascular: Vascular  calcifications but no aneurysm or hyperdense vessels. Skull: Right craniectomy defect but no acute fracture or bone lesions. Sinuses/Orbits: The paranasal sinuses and mastoid air cells are clear. The globes are intact. Other: Significant left periorbital hematoma. CT CERVICAL SPINE FINDINGS Alignment: Normal Skull base and vertebrae: No acute fracture. No primary bone lesion or focal pathologic process. Soft tissues and spinal canal: No prevertebral fluid or swelling. No visible canal hematoma. Disc levels: The spinal canal is quite generous. No large disc protrusions, spinal or foraminal stenosis. Upper chest: The lung apices are grossly clear. Other: Evidence of prior right-sided thyroidectomy. The left thyroid lobe is still in place. Moderate nodularity with a 13 mm nodule noted. Not clinically significant; no follow-up imaging recommended (ref: J Am Coll Radiol. 2015 Feb;12(2): 143-50). IMPRESSION: 1. Large craniectomy defect involving the right parietal lobe with underlying extensive encephalomalacia in the right parietal lobe. This could be postoperative or posttraumatic change. 2. No acute intracranial findings or skull fracture. 3. Significant left periorbital hematoma. 4. Normal alignment of the cervical vertebral bodies and no acute cervical spine fracture. Electronically Signed   By: Marijo Sanes M.D.   On: 01/17/2021 13:06   MR BRAIN WO CONTRAST  Result Date: 01/17/2021 CLINICAL DATA:  Hypertension and diabetes. Left-sided weakness. Fell today. EXAM: MRI HEAD WITHOUT CONTRAST TECHNIQUE: Multiplanar, multiecho pulse sequences of the brain and surrounding structures were obtained without intravenous contrast. COMPARISON:  Head CT earlier same day. FINDINGS: Brain: The study suffers from motion degradation. Diffusion imaging does not show any acute or subacute infarction. Old small vessel infarctions affect the cerebellum on both sides. Chronic small-vessel ischemic change of the pons. Wallerian  degeneration on the right. Old small vessel infarctions of the thalami. Chronic small-vessel ischemic changes of the cerebral hemispheric white matter. Old right insular and frontal operculum infarction with atrophy, encephalomalacia and gliosis. No evidence of recent hemorrhage, hydrocephalus or extra-axial collection. Vascular: Major vessels at the base of the brain show flow. Skull and upper cervical spine: Large right frontal craniotomy. Sinuses/Orbits: Clear/normal Other: None IMPRESSION: No acute finding by MRI. Extensive chronic small-vessel ischemic changes throughout the brain as outlined above. Old right insular and frontal operculum infarction with atrophy, encephalomalacia and gliosis. Wallerian degeneration on the right. Old cerebellar infarctions. Electronically Signed   By: Nelson Chimes M.D.   On: 01/17/2021 23:21   US RENAL  Result Date: 01/17/2021 CLINICAL DATA:  Acute kidney injury. EXAM: RENAL / URINARY TRACT ULTRASOUND COMPLETE COMPARISON:  None recent FINDINGS: Right Kidney: Renal measurements: 8.2 x 3.9 x 3.6 cm = volume: 60 mL. There is increased cortical echogenicity without evidence for hydronephrosis. Left Kidney: Renal measurements: 9 x 3.6 cm =. There is increased cortical echogenicity without evidence for hydronephrosis. There is a 2.1 cm cyst in the interpolar region. Bladder: Appears normal for degree of bladder distention. Other: None. IMPRESSION: 1. No hydronephrosis. 2. Echogenic kidneys bilaterally which can be seen in patients with medical renal disease. Electronically Signed   By: Constance Holster M.D.   On: 01/17/2021 16:06   DG Hand Complete Left  Result  Date: 01/17/2021 CLINICAL DATA:  Fall, left and stiffness and swelling, history of CVA with left-sided deficits EXAM: LEFT HAND - COMPLETE 3+ VIEW COMPARISON:  None. FINDINGS: Prominent generalized dorsal left hand soft tissue swelling. No fracture or dislocation. No suspicious focal osseous lesions. No osseous  erosions or periosteal reaction. Mild first MCP joint and first carpometacarpal joint osteoarthritis. No radiopaque foreign bodies. IMPRESSION: Prominent generalized dorsal left hand soft tissue swelling. No acute osseous abnormality. Mild first MCP joint and first carpometacarpal joint osteoarthritis. Electronically Signed   By: Ilona Sorrel M.D.   On: 01/17/2021 12:44   CT Maxillofacial Wo Contrast  Result Date: 01/17/2021 CLINICAL DATA:  Facial trauma. Swelling of the left eye. [place] while going to the bathroom. EXAM: CT MAXILLOFACIAL WITHOUT CONTRAST TECHNIQUE: Multidetector CT imaging of the maxillofacial structures was performed. Multiplanar CT image reconstructions were also generated. COMPARISON:  None. FINDINGS: Osseous: Right-sided parietal craniectomy is noted. No acute facial bone fractures are identified. Orbits: The orbital bones are intact. The globes are intact. No intraorbital hematoma. Sinuses: The paranasal sinuses and mastoid air cells are clear. Soft tissues: Significant left periorbital hematoma. Limited intracranial: No significant findings. Remote posttraumatic or postsurgical changes involving the right parietal lobe. IMPRESSION: 1. Significant left periorbital hematoma but no acute facial bone fractures. 2. Remote posttraumatic or postsurgical changes involving the right parietal lobe. Electronically Signed   By: Marijo Sanes M.D.   On: 01/17/2021 13:00     Medications:   . lactated ringers 75 mL/hr at 01/18/21 0655   . amLODipine  7.5 mg Oral Daily  . aspirin EC  81 mg Oral Daily  . cholecalciferol  1,000 Units Oral Daily  . insulin aspart  0-5 Units Subcutaneous QHS  . insulin aspart  0-9 Units Subcutaneous TID WC  . latanoprost  1 drop Both Eyes QHS  . letrozole  2.5 mg Oral Daily  . levothyroxine  112 mcg Oral Q0600  . metoprolol succinate  12.5 mg Oral Daily   acetaminophen **OR** acetaminophen, hydrALAZINE, loperamide, meclizine  Assessment/ Plan:  Kirsten Mcmahon is a 75 y.o. black female with hypertension, CVA with residual left sided weakness, diabetes mellitus type II, hypothyroidism, hyperlipidemia, glaucoma, depression, seizure disorder and history of breast cancer who was admitted to [facility] on 01/17/2021 for Fall [W19.XXXA]  1. Chronic kidney disease stage V: baseline creatinine of 3.97, GFR of 10 on 10/16/2020. Followed by [facility], Dr. Radene Knee. There is mention of a possible kidney biopsy from recent office note but no evidence that the biopsy has been performed at this time.  No acute indication for dialysis Creatinine is 4.37, previous outpatient creatinine of 3.97 ( 10/16/20)    2. Metabolic acidosis   3. Anemia of chronic kidney disease: hemoglobin of 9.0  Dr. Juleen China  Reviewed MRI with family. Appreciate neurology input.      LOS: 0 Kirsten Mcmahon 2/13/20229:27 AM

## 2021-01-18 NOTE — Progress Notes (Signed)
Chart reviewed with noted events of the day. Will proceed with bedside swallow eval Monday as appropriate. Continue NPO

## 2021-01-18 NOTE — Progress Notes (Signed)
OT Cancellation Note  Patient Details Name: Kirsten Mcmahon MRN: [id number] DOB: May 09, 1946   Cancelled Treatment:    Reason Eval/Treat Not Completed: Medical issues which prohibited therapy. RR called this AM for questionable seizure activity. OT to attempt at later time/date when pt is medically appropriate.  Fredirick Maudlin, OTR/L [place]

## 2021-01-19 DIAGNOSIS — G9341 Metabolic encephalopathy: Secondary | ICD-10-CM

## 2021-01-19 LAB — COMPREHENSIVE METABOLIC PANEL
ALT: 7 U/L (ref 0–44)
AST: 15 U/L (ref 15–41)
Albumin: 2.5 g/dL — ABNORMAL LOW (ref 3.5–5.0)
Alkaline Phosphatase: 53 U/L (ref 38–126)
Anion gap: 11 (ref 5–15)
BUN: 34 mg/dL — ABNORMAL HIGH (ref 8–23)
CO2: 21 mmol/L — ABNORMAL LOW (ref 22–32)
Calcium: 8.2 mg/dL — ABNORMAL LOW (ref 8.9–10.3)
Chloride: 110 mmol/L (ref 98–111)
Creatinine, Ser: 4.33 mg/dL — ABNORMAL HIGH (ref 0.44–1.00)
GFR, Estimated: 10 mL/min — ABNORMAL LOW (ref >60)
Glucose, Bld: 98 mg/dL (ref 70–99)
Potassium: 4.1 mmol/L (ref 3.5–5.1)
Sodium: 142 mmol/L (ref 135–145)
Total Bilirubin: 0.5 mg/dL (ref 0.3–1.2)
Total Protein: 6.3 g/dL — ABNORMAL LOW (ref 6.5–8.1)

## 2021-01-19 LAB — HEPATITIS B DNA, ULTRAQUANTITATIVE, PCR
HBV DNA SERPL PCR-ACNC: NOT DETECTED IU/mL
HBV DNA SERPL PCR-LOG IU: UNDETERMINED log10 IU/mL

## 2021-01-19 LAB — PARATHYROID HORMONE, INTACT (NO CA): PTH: 161 pg/mL — ABNORMAL HIGH (ref 15–65)

## 2021-01-19 LAB — TSH: TSH: 3.72 u[IU]/mL (ref 0.350–4.500)

## 2021-01-19 LAB — GLUCOSE, CAPILLARY
Glucose-Capillary: 88 mg/dL (ref 70–99)
Glucose-Capillary: 97 mg/dL (ref 70–99)
Glucose-Capillary: 92 mg/dL (ref 70–99)

## 2021-01-19 LAB — HEPATITIS B SURFACE ANTIBODY, QUANTITATIVE: Hep B S AB Quant (Post): <3.1 m[IU]/mL — ABNORMAL LOW (ref >9.9)

## 2021-01-19 MED ORDER — LEVETIRACETAM IN NACL 1500 MG/100ML IV SOLN
1500.0000 mg | Freq: Once | INTRAVENOUS | Status: AC
  Administered 2021-01-19: 1500 mg via INTRAVENOUS
  Filled 2021-01-19: qty 100

## 2021-01-19 MED ORDER — LEVETIRACETAM 500 MG PO TABS
500.0000 mg | ORAL_TABLET | Freq: Two times a day (BID) | ORAL | Status: DC
  Administered 2021-01-20: 500 mg via ORAL
  Filled 2021-01-19 (×2): qty 1

## 2021-01-19 MED ORDER — HEPARIN SODIUM (PORCINE) 5000 UNIT/ML IJ SOLN
5000.0000 [IU] | Freq: Three times a day (TID) | INTRAMUSCULAR | Status: DC
  Administered 2021-01-19 – 2021-01-20 (×3): 5000 [IU] via SUBCUTANEOUS
  Filled 2021-01-19 (×3): qty 1

## 2021-01-19 NOTE — Evaluation (Signed)
Clinical/Bedside Swallow Evaluation Patient Details  Name: Kirsten Mcmahon MRN: [id number] Date of Birth: 1945/12/13  Today's Date: 01/19/2021 Time: SLP Start Time (ACUTE ONLY): 1000 SLP Stop Time (ACUTE ONLY): 1040 SLP Time Calculation (min) (ACUTE ONLY): 40 min  Past Medical History:  Past Medical History:  Diagnosis Date  . Cancer [facility]) 2017   Right breast  . Depression   . GERD (gastroesophageal reflux disease)   . Glaucoma   . Hemiparesis ([place])    left side  . High cholesterol   . History of seizure    x 1 - after a spider bite  . History of stroke with residual deficit    left-side weakness  . Hypertension    states BP under control with meds., has been on med. x 2 yr.  . Hypothyroidism   . Non-insulin dependent type 2 diabetes mellitus ([place])   . Overactive bladder   . Stroke [facility])    1998 weakness on left side   Past Surgical History:  Past Surgical History:  Procedure Laterality Date  . ABDOMINAL HYSTERECTOMY     complete  . BREAST BIOPSY Left 08/03/2018   Benign adipose tissue  . BREAST EXCISIONAL BIOPSY Right 2014   Positive  . BREAST LUMPECTOMY Right   . CATARACT EXTRACTION W/ INTRAOCULAR LENS IMPLANT Left   . CEREBRAL ANEURYSM REPAIR  1998  . PICC LINE INSERTION    . THYROID LOBECTOMY Right 07/15/2017   Procedure: RIGHT THYROID LOBECTOMY;  Surgeon: Armandina Gemma, MD;  Location: [place];  Service: General;  Laterality: Right;   HPI:  Per admitting H&P " The patient is coming from home.  At baseline, pt is partially dependent for most of ADL.           Chief Complaint: fall      HPI: Kirsten Mcmahon is a 75 y.o. female with medical history significant of hypertension, hyperlipidemia, diabetes mellitus, stroke with left-sided weakness, wheelchair-bound, GERD, hypothyroidism, depression, breast cancer, CKD stage IIIb, who presents with a fall.     Per her daughter (I called her daughter by phone), pt fell while transferring form wheelchair to toilet yesterday,  causing her to land onto her face on the bathroom floor. No LOC. She injured her left eye. She has swelling around left eye.  Patient is mildly confused, but answered most of questions appropriately. She reports that she always has swelling to her left hand after her remote stroke and does not think she fell onto it.  Patient denies chest pain, cough, shortness breath, fever or chills.  No nausea, vomiting, diarrhea, abdominal pain.  Denies symptoms of UTI. Daughter states that pt has more difficult speaking"   Assessment / Plan / Recommendation Clinical Impression  Kirsten Mcmahon is a pleasant 75 YO lady presenting with mild to moderate oral phase dysphagia but no s/s of aspiration. Pt became tearful several times during the assessment but was very appreciate of services. "I can't talk".  She was able to follow simple directions and produce single words and short phrases at times. Pt tolerated a few sips of water via straw and bites of applesauce without difficulty. Given a bite of graham cracker, she needed extended time to masticate and clear the bolus. Mild oral residue after the swallow. Vocal quality remained clear throughout assessment. Rec Dys 2 chopped diet with thin liquids once cleared to resume diet after testing/procedures. Meds to be given in applesauce. ST to follow up with toleration of diet and assess speech and  language. Pt is motivated to participate. Per chart speech was [facility] prior to this event. SLP Visit Diagnosis: Dysphagia, oropharyngeal phase (R13.12)    Aspiration Risk  Mild aspiration risk    Diet Recommendation Dysphagia 2 (Fine chop)   Liquid Administration via: Straw Medication Administration: Crushed with puree Supervision: Staff to assist with self feeding Compensations: Small sips/bites;Slow rate;Minimize environmental distractions Postural Changes: Seated upright at 90 degrees;Remain upright for at least 30 minutes after po intake    Other  Recommendations      Follow up Recommendations Skilled Nursing facility;Inpatient Rehab      Frequency and Duration min 3x week  2 weeks       Prognosis Prognosis for Safe Diet Advancement: Good Barriers to Reach Goals: Language deficits      Swallow Study   General Date of Onset: 01/17/21 HPI: Per admitting H&P " The patient is coming from home.  At baseline, pt is partially dependent for most of ADL.           Chief Complaint: fall      HPI: Kirsten Mcmahon is a 75 y.o. female with medical history significant of hypertension, hyperlipidemia, diabetes mellitus, stroke with left-sided weakness, wheelchair-bound, GERD, hypothyroidism, depression, breast cancer, CKD stage IIIb, who presents with a fall.     Per her daughter (I called her daughter by phone), pt fell while transferring form wheelchair to toilet yesterday, causing her to land onto her face on the bathroom floor. No LOC. She injured her left eye. She has swelling around left eye.  Patient is mildly confused, but answered most of questions appropriately. She reports that she always has swelling to her left hand after her remote stroke and does not think she fell onto it.  Patient denies chest pain, cough, shortness breath, fever or chills.  No nausea, vomiting, diarrhea, abdominal pain.  Denies symptoms of UTI. Daughter states that pt has more difficult speaking" Type of Study: Bedside Swallow Evaluation Diet Prior to this Study: NPO Respiratory Status: Room air History of Recent Intubation: No Behavior/Cognition: Alert;Cooperative;Pleasant mood;Other (Comment) (Tearful when realizing her deficits. Very thankful for services) Oral Cavity Assessment: Within Functional Limits Oral Cavity - Dentition: Missing dentition;Poor condition Vision: Impaired for self-feeding Self-Feeding Abilities: Needs assist Patient Positioning: Upright in bed Baseline Vocal Quality: Low vocal intensity;Hoarse;Other (comment) (Difficulty with speech, needs assessment)     Oral/Motor/Sensory Function Overall Oral Motor/Sensory Function: Mild impairment Facial ROM: Reduced left Facial Symmetry: Abnormal symmetry left Lingual ROM: Within Functional Limits Lingual Symmetry: Within Functional Limits Lingual Strength: Within Functional Limits Mandible: Within Functional Limits   Ice Chips Ice chips: Within functional limits Presentation: Spoon   Thin Liquid Thin Liquid: Within functional limits Presentation: Cup;Straw;Spoon    Nectar Thick Nectar Thick Liquid: Not tested   Honey Thick     Puree Puree: Within functional limits Presentation: Spoon   Solid     Solid: Impaired Oral Phase Impairments: Impaired mastication Oral Phase Functional Implications: Oral residue;Prolonged oral transit      Lucila Maine 01/19/2021,11:20 AM

## 2021-01-19 NOTE — Procedures (Signed)
Patient Name: Kirsten Mcmahon  MRN: [id number]  Epilepsy Attending: Lora Havens  Referring Physician/Provider: Dr Marylu Lund Date: 01/19/2021 Duration: 29.08 mins  Patient history: 75yo F with ams. EEG to evaluate for seizure  Level of alertness: Awake  AEDs during EEG study: None  Technical aspects: This EEG study was done with scalp electrodes positioned according to the 10-20 International system of electrode placement. Electrical activity was acquired at a sampling rate of 500Hz  and reviewed with a high frequency filter of 70Hz  and a low frequency filter of 1Hz . EEG data were recorded continuously and digitally stored.   Description: The posterior dominant rhythm consists of 8 Hz activity of moderate voltage (25-35 uV) seen predominantly in posterior head regions, symmetric and reactive to eye opening and eye closing. EEG showed sharply contoured 6-7hz  theta slowing in right frontal region consistent with breech artifact. Single bilateral frontotemporal spike was also noted. Physiologic photic driving was not seen during photic stimulation.  Hyperventilation was not performed.     ABNORMALITY - Spike, bilateral frontotemporal - Continuous slow,  right frontal region - Breech aritfact, right frontal region    IMPRESSION: This study is showed evidence of potential epileptogenicity arising from bilateral frontotemporal region, There is also cortical dysfunction due to underlying stroke and craniotomy in right frontal region. No seizures were seen throughout the recording.  [name]

## 2021-01-19 NOTE — Plan of Care (Signed)
  Problem: Education: Goal: Knowledge of General Education information will improve Description: Including pain rating scale, medication(s)/side effects and non-pharmacologic comfort measures 01/19/2021 1556 by Cristela Blue, RN Outcome: Progressing 01/19/2021 1556 by Cristela Blue, RN Outcome: Progressing   Problem: Health Behavior/Discharge Planning: Goal: Ability to manage health-related needs will improve 01/19/2021 1556 by Cristela Blue, RN Outcome: Progressing 01/19/2021 1556 by Cristela Blue, RN Outcome: Progressing   Problem: Clinical Measurements: Goal: Ability to maintain clinical measurements within normal limits will improve 01/19/2021 1556 by Cristela Blue, RN Outcome: Progressing 01/19/2021 1556 by Cristela Blue, RN Outcome: Progressing Goal: Will remain free from infection 01/19/2021 1556 by Cristela Blue, RN Outcome: Progressing 01/19/2021 1556 by Cristela Blue, RN Outcome: Progressing Goal: Diagnostic test results will improve 01/19/2021 1556 by Cristela Blue, RN Outcome: Progressing 01/19/2021 1556 by Cristela Blue, RN Outcome: Progressing Goal: Respiratory complications will improve 01/19/2021 1556 by Cristela Blue, RN Outcome: Progressing 01/19/2021 1556 by Cristela Blue, RN Outcome: Progressing Goal: Cardiovascular complication will be avoided 01/19/2021 1556 by Cristela Blue, RN Outcome: Progressing 01/19/2021 1556 by Cristela Blue, RN Outcome: Progressing   Problem: Activity: Goal: Risk for activity intolerance will decrease 01/19/2021 1556 by Cristela Blue, RN Outcome: Progressing 01/19/2021 1556 by Cristela Blue, RN Outcome: Progressing   Problem: Nutrition: Goal: Adequate nutrition will be maintained 01/19/2021 1556 by Cristela Blue, RN Outcome: Progressing 01/19/2021 1556 by Cristela Blue, RN Outcome: Progressing   Problem: Coping: Goal: Level of anxiety will decrease 01/19/2021 1556 by Cristela Blue, RN Outcome:  Progressing 01/19/2021 1556 by Cristela Blue, RN Outcome: Progressing   Problem: Elimination: Goal: Will not experience complications related to bowel motility 01/19/2021 1556 by Cristela Blue, RN Outcome: Progressing 01/19/2021 1556 by Cristela Blue, RN Outcome: Progressing Goal: Will not experience complications related to urinary retention 01/19/2021 1556 by Cristela Blue, RN Outcome: Progressing 01/19/2021 1556 by Cristela Blue, RN Outcome: Progressing   Problem: Pain Managment: Goal: General experience of comfort will improve 01/19/2021 1556 by Cristela Blue, RN Outcome: Progressing 01/19/2021 1556 by Cristela Blue, RN Outcome: Progressing   Problem: Safety: Goal: Ability to remain free from injury will improve 01/19/2021 1556 by Cristela Blue, RN Outcome: Progressing 01/19/2021 1556 by Cristela Blue, RN Outcome: Progressing   Problem: Skin Integrity: Goal: Risk for impaired skin integrity will decrease 01/19/2021 1556 by Cristela Blue, RN Outcome: Progressing 01/19/2021 1556 by Cristela Blue, RN Outcome: Progressing

## 2021-01-19 NOTE — Progress Notes (Signed)
Order for midline noted. Patient not a candidate for midline/PICC placement as she is stage V kidney failure client followed by nephrology. PIV started and primary RN notiified.

## 2021-01-19 NOTE — Progress Notes (Signed)
eeg done °

## 2021-01-19 NOTE — Progress Notes (Signed)
VAST consulted to obtain IV access. Pt's left arm restricted d/t stroke. Assessed right arm utilizing ultrasound; very limited vasculature. 22 G 1.75 inch catheter placed in right anterior forearm as charted in flowsheets. Noted knot on right forearm, medially and proximally to IV insertion site prior to IV insertion. Patient reported it was not tender or painful.  Reported difficult access and knot on right arm to unit RN. Educated to assess patient's arm circumference using tape measure to assess for possible infiltration as knot will make it difficult to visually assess. Unit nurse verbalized understanding.

## 2021-01-19 NOTE — Progress Notes (Signed)
[name]  ROUNDING NOTE   Subjective:   Kirsten Mcmahon seen resting quietly in bed. She is alert and oriented Aphasic but able to answer questions Currently room air Denies shortness of breath and chest pain. Currently NPO for procedures  Objective:  Vital signs in last 24 hours:  Temp:  [98.3 F (36.8 C)-99.6 F (37.6 C)] 98.6 F (37 C) (02/14 1139) Pulse Rate:  [88-102] 99 (02/14 1139) Resp:  [16-18] 16 (02/14 1139) BP: (130-147)/(78-92) 142/92 (02/14 1139) SpO2:  [93 %-100 %] 100 % (02/14 1139)  Weight change:  Filed Weights   01/17/21 1136 01/17/21 1143  Weight: 72.6 kg 72 kg    Intake/Output: I/O last 3 completed shifts: In: 3182.3 [P.O.:120; I.V.:2062.3; IV Piggyback:1000] Out: 1050 [Urine:1050]   Intake/Output this shift:  No intake/output data recorded.  Physical Exam: General: Lying in bed.   Head:  Swelling around left eye. Moist oral mucosal membranes  Eyes: Anicteric  Neck: Supple, trachea midline  Lungs:  Clear to auscultation  Heart: Regular rate and rhythm  Abdomen:  Soft, nontender,   Extremities:  No peripheral edema.  Neurologic: Alert, able to answer questions  Skin: No lesions       Basic Metabolic Panel: Recent Labs  Lab 01/17/21 1202 01/18/21 0451 01/19/21 0602  NA 142 142  142 142  K 4.0 4.0  4.0 4.1  CL 108 110  109 110  CO2 19* 20*  20* 21*  GLUCOSE 108* 120*  118* 98  BUN 38* 39*  39* 34*  CREATININE 4.72* 4.37*  4.38* 4.33*  CALCIUM 8.7* 8.4*  8.5* 8.2*  PHOS  --  4.5  --     Liver Function Tests: Recent Labs  Lab 01/17/21 1202 01/18/21 0451 01/19/21 0602  AST 19 20 15   ALT 10 12 7   ALKPHOS 70 65 53  BILITOT 0.6 0.7 0.5  PROT 7.9 7.7 6.3*  ALBUMIN 3.2* 3.2*  3.1* 2.5*   No results for input(s): LIPASE, AMYLASE in the last 168 hours. No results for input(s): AMMONIA in the last 168 hours.  CBC: Recent Labs  Lab 01/17/21 1202 01/18/21 0451  WBC 10.3 11.1*  NEUTROABS 7.7 9.5*   HGB 9.3* 9.0*  HCT 29.3* 27.9*  MCV 91.3 91.2  PLT 290 246    Cardiac Enzymes: Recent Labs  Lab 01/18/21 0451  CKTOTAL 204    BNP: Invalid input(s): POCBNP  CBG: Recent Labs  Lab 01/18/21 1133 01/18/21 1631 01/18/21 2133 01/19/21 0737 01/19/21 1138  GLUCAP 153* 113* 92 88 97    Microbiology: Results for orders placed or performed during the hospital encounter of 01/17/21  SARS CORONAVIRUS 2 (TAT 6-24 HRS) Nasopharyngeal Nasopharyngeal Swab     Status: None   Collection Time: 01/17/21  2:24 PM   Specimen: Nasopharyngeal Swab  Result Value Ref Range Status   SARS Coronavirus 2 NEGATIVE NEGATIVE Final    Comment: (NOTE) SARS-CoV-2 target nucleic acids are NOT DETECTED.  The SARS-CoV-2 RNA is generally detectable in upper and lower respiratory specimens during the acute phase of infection. Negative results do not preclude SARS-CoV-2 infection, do not rule out co-infections with other pathogens, and should not be used as the sole basis for treatment or other patient management decisions. Negative results must be combined with clinical observations, patient history, and epidemiological information. The expected result is Negative.  Fact Sheet for Patients: SugarRoll.be  Fact Sheet for Healthcare Providers: https://www.woods-mathews.com/  This test is not yet approved or cleared by the  [place] [employer] and  has been authorized for detection and/or diagnosis of SARS-CoV-2 by FDA under an Print production planner (EUA). This EUA will remain  in effect (meaning this test can be used) for the duration of the COVID-19 declaration under Se ction 564(b)(1) of the Act, 21 U.S.C. section 360bbb-3(b)(1), unless the authorization is terminated or revoked sooner.  Performed at [facility], [place] [address] [place], [place] [address]     Coagulation Studies: No results for input(s): LABPROT, INR in the last 72  hours.  Urinalysis: Recent Labs    01/17/21 1733  COLORURINE STRAW*  LABSPEC 1.009  PHURINE 6.0  GLUCOSEU NEGATIVE  HGBUR SMALL*  BILIRUBINUR NEGATIVE  KETONESUR NEGATIVE  PROTEINUR 100*  NITRITE NEGATIVE  LEUKOCYTESUR NEGATIVE      Imaging: MR BRAIN WO CONTRAST  Result Date: 01/17/2021 CLINICAL DATA:  Hypertension and diabetes. Left-sided weakness. Fell today. EXAM: MRI HEAD WITHOUT CONTRAST TECHNIQUE: Multiplanar, multiecho pulse sequences of the brain and surrounding structures were obtained without intravenous contrast. COMPARISON:  Head CT earlier same day. FINDINGS: Brain: The study suffers from motion degradation. Diffusion imaging does not show any acute or subacute infarction. Old small vessel infarctions affect the cerebellum on both sides. Chronic small-vessel ischemic change of the pons. Wallerian degeneration on the right. Old small vessel infarctions of the thalami. Chronic small-vessel ischemic changes of the cerebral hemispheric white matter. Old right insular and frontal operculum infarction with atrophy, encephalomalacia and gliosis. No evidence of recent hemorrhage, hydrocephalus or extra-axial collection. Vascular: Major vessels at the base of the brain show flow. Skull and upper cervical spine: Large right frontal craniotomy. Sinuses/Orbits: Clear/normal Other: None IMPRESSION: No acute finding by MRI. Extensive chronic small-vessel ischemic changes throughout the brain as outlined above. Old right insular and frontal operculum infarction with atrophy, encephalomalacia and gliosis. Wallerian degeneration on the right. Old cerebellar infarctions. Electronically Signed   By: Nelson Chimes M.D.   On: 01/17/2021 23:21   US RENAL  Result Date: 01/17/2021 CLINICAL DATA:  Acute kidney injury. EXAM: RENAL / URINARY TRACT ULTRASOUND COMPLETE COMPARISON:  None recent FINDINGS: Right Kidney: Renal measurements: 8.2 x 3.9 x 3.6 cm = volume: 60 mL. There is increased cortical  echogenicity without evidence for hydronephrosis. Left Kidney: Renal measurements: 9 x 3.6 cm =. There is increased cortical echogenicity without evidence for hydronephrosis. There is a 2.1 cm cyst in the interpolar region. Bladder: Appears normal for degree of bladder distention. Other: None. IMPRESSION: 1. No hydronephrosis. 2. Echogenic kidneys bilaterally which can be seen in patients with medical renal disease. Electronically Signed   By: Constance Holster M.D.   On: 01/17/2021 16:06   DG Chest Port 1 View  Result Date: 01/18/2021 CLINICAL DATA:  Aspiration. Stroke. Hypertension. Right breast cancer. EXAM: PORTABLE CHEST 1 VIEW COMPARISON:  07/23/2018 radiographs FINDINGS: Low lung volumes are present, causing crowding of the pulmonary vasculature. Tortuous thoracic aorta. Indistinct margin of the left cardiac border, difficult to exclude a mild lingular airspace opacity. Clips along the right lower neck and right axilla. Left greater than right degenerative glenohumeral arthropathy. Indistinct pulmonary vasculature, possibly from pulmonary venous hypertension. No Kerley B lines to suggest overt interstitial edema. No appreciable blunting of the costophrenic angles. IMPRESSION: 1. Indistinct margin of the left cardiac border, difficult to exclude a mild lingular pneumonia although a prominent epicardial fat pad can sometimes cause a similar appearance. 2. Mild enlargement of the cardiopericardial silhouette with indistinct pulmonary vasculature, possibly from pulmonary venous hypertension.  No overt edema. 3. Low lung volumes. 4. Left greater than right degenerative glenohumeral arthropathy.1 Electronically Signed   By: Van Clines M.D.   On: 01/18/2021 18:15     Medications:   . lactated ringers 75 mL/hr at 01/19/21 0627   . amLODipine  7.5 mg Oral Daily  . aspirin EC  81 mg Oral Daily  . cholecalciferol  1,000 Units Oral Daily  . heparin injection (subcutaneous)  5,000 Units Subcutaneous  Q8H  . insulin aspart  0-5 Units Subcutaneous QHS  . insulin aspart  0-9 Units Subcutaneous TID WC  . latanoprost  1 drop Both Eyes QHS  . letrozole  2.5 mg Oral Daily  . levothyroxine  112 mcg Oral Q0600  . LORazepam  1 mg Intramuscular Once  . metoprolol succinate  12.5 mg Oral Daily   acetaminophen **OR** acetaminophen, hydrALAZINE, loperamide, meclizine  Assessment/ Plan:  Kirsten Mcmahon is a 75 y.o. black female with hypertension, CVA with residual left sided weakness, diabetes mellitus type II, hypothyroidism, hyperlipidemia, glaucoma, depression, seizure disorder and history of breast cancer who was admitted to [facility] on 01/17/2021 for Fall [W19.XXXA]  1. Chronic kidney disease stage V:  baseline creatinine of 3.97, GFR of 10 on 10/16/2020.  Followed by [facility], Dr. Radene Knee.  Shows no acute need for HD at this time Will continue to monitor labs Creatinine shows slight improvement to 4.33 Will contact Dr Radene Knee and discuss patient treatment  2. Metabolic acidosis  -managed by primary team  3. Anemia of chronic kidney disease:  Hgb 9.0.  4. Diabetes Mellitus Type 2 with renal manifestations -HBG A1c-5.7 -Glucose well managed during inpatient stay     LOS: 1 [place] 2/14/20222:26 PM

## 2021-01-19 NOTE — Progress Notes (Signed)
PROGRESS NOTE    Kirsten Mcmahon  XLK:[id number] DOB: 1946/03/16 DOA: 01/17/2021 PCP: System, Provider Not In    Brief Narrative:  75 y.o. female with medical history significant of hypertension, hyperlipidemia, diabetes mellitus, stroke with left-sided weakness, wheelchair-bound, GERD, hypothyroidism, depression, breast cancer, CKD stage IIIb, who presents with a fall.  In the ED, pt was found to have WBC 18.3, COVID-19 PCR was neg, troponin level 7, worsening renal function, temperature normal, blood pressure 149/82, heart rate 98, RR 19, oxygen saturation 97% on room air.  X-ray of left hand is negative for bony fracture.  CT of the C-spine is negative for acute bony fracture. Nephrology was consulted  Assessment & Plan:   Principal Problem:   Fall Active Problems:   Malignant neoplasm of upper-outer quadrant of right breast in female, estrogen receptor positive ([place])   Hypertension   Hypothyroidism   Stroke ([place])   Anemia in chronic kidney disease (CKD)   Acute metabolic encephalopathy   Acute renal failure superimposed on stage 3b chronic kidney disease (HCC)   Type II diabetes mellitus with renal manifestations ([place])   ARF (acute renal failure) ([place])    Fall and acute metabolic encephalopathy: Likely multifactorial etiology, including worsening renal function and pain from eye injury. CT of head, CT of C-spine, CT of maxillary image did not show acute intracranial abnormalities or bony fracture.   -MRI brain was obtained and reviewed with Neurologist. Neg for acute infarct -PT/OT consulted, recommendation for SNF  Malignant neoplasm of upper-outer quadrant of right breast in female, estrogen receptor positive ([place]) -Continue letrozole as pt tolerates  Hypertension -BP overall stable at this time -Patient is continued on amlodipine, metoprolol, clonidine patch -Continue with PRN hydralazine -Cozaar had been on hold secondary to presenting worsened renal  function  Prior hx of Stroke [facility]) -continue on aspirin  Hypothyroidism: -continue with synthroid -TSH is within normal limits  Anemia in chronic kidney disease (CKD):  -Hemoglobin slightly dropped from 9.8 on 10/16/2020 --> 9.3 at time of presentation -Hgb currently 9.0 today -Cont to follow CBC trends  Acute renal failure superimposed on stage 3b chronic kidney disease ([place]): Baseline hemoglobin 1.4-1.6 recently.  Presenting creatinine of 4.72, BUN 38. Likely due to dehydration and continuation of ARB - Pt is continued on IVF hydration - cont to avoid nephrotoxic agents - Continue to hold Cozaar as per above - US-renal reviewed. No hydronephrosis seen. Evidence of medical renal disease noted - Nephrology is following. No significant change in renal function. Nephrology to discuss with pt's primary nephrologist regarding treatment plan  Type II diabetes mellitus with renal manifestations [facility]): Recent A1c 8.0 on 07/07/2017, poorly controlled -Continue with sliding scale insulin -Hgb A1c noted to be 5.7  New seizurese -Please see RRT documentation. Recently noted to have period of apparent unresponsiveness and groaning that was transient while being fed -Case was discussed with Neurology at that time, who recommended EEG -EEG was performed today, reviewed. Findings worrisome for potential epileptogenicity arising from B frontotemporal region -Have consulted and discussed case with Neurology with recommendation for loading of keppra. Neurology to formally consult  DVT prophylaxis: SCD's Code Status: Full Family Communication: Pt in room  Status is: Inpt  Pt will benefit from continued inpatient stay because  Unsafe d/c plan and Inpatient level of care appropriate due to severity of illness  Dispo: The patient is from: Home              Anticipated d/c is to: Unclear at  this time. Pending PT/OT eval              Anticipated d/c date is: 3 days              Patient currently  is not medically stable to d/c.   Difficult to place patient No  Consultants:   Nephrology  Neurology  Procedures:     Antimicrobials: Anti-infectives (From admission, onward)   None      Subjective: Without complaints. Was tearful this AM  Objective: Vitals:   01/19/21 0033 01/19/21 0410 01/19/21 0736 01/19/21 1139  BP: 135/80 130/78 (!) 147/79 (!) 142/92  Pulse: 93 88 91 99  Resp: 17 16 16 16   Temp: 98.7 F (37.1 C) 98.3 F (36.8 C) 99 F (37.2 C) 98.6 F (37 C)  TempSrc: Oral Axillary    SpO2: 100% 98% 100% 100%  Weight:      Height:        Intake/Output Summary (Last 24 hours) at 01/19/2021 1740 Last data filed at 01/19/2021 1500 Gross per 24 hour  Intake 1488.27 ml  Output 400 ml  Net 1088.27 ml   Filed Weights   01/17/21 1136 01/17/21 1143  Weight: 72.6 kg 72 kg    Examination: General exam: Awake, laying in bed, in nad Respiratory system: Normal respiratory effort, no wheezing Cardiovascular system: regular rate, s1, s2 Gastrointestinal system: Soft, nondistended, positive BS Central nervous system: CN2-12 grossly intact, strength intact Extremities: Perfused, no clubbing Skin: Normal skin turgor, no notable skin lesions seen Psychiatry: Mood normal // no visual hallucinations    Data Reviewed: I have personally reviewed following labs and imaging studies  CBC: Recent Labs  Lab 01/17/21 1202 01/18/21 0451  WBC 10.3 11.1*  NEUTROABS 7.7 9.5*  HGB 9.3* 9.0*  HCT 29.3* 27.9*  MCV 91.3 91.2  PLT 290 409   Basic Metabolic Panel: Recent Labs  Lab 01/17/21 1202 01/18/21 0451 01/19/21 0602  NA 142 142  142 142  K 4.0 4.0  4.0 4.1  CL 108 110  109 110  CO2 19* 20*  20* 21*  GLUCOSE 108* 120*  118* 98  BUN 38* 39*  39* 34*  CREATININE 4.72* 4.37*  4.38* 4.33*  CALCIUM 8.7* 8.4*  8.5* 8.2*  PHOS  --  4.5  --    GFR: Estimated Creatinine Clearance: 10.3 mL/min (A) (by C-G formula based on SCr of 4.33 mg/dL (H)). Liver  Function Tests: Recent Labs  Lab 01/17/21 1202 01/18/21 0451 01/19/21 0602  AST 19 20 15   ALT 10 12 7   ALKPHOS 70 65 53  BILITOT 0.6 0.7 0.5  PROT 7.9 7.7 6.3*  ALBUMIN 3.2* 3.2*  3.1* 2.5*   No results for input(s): LIPASE, AMYLASE in the last 168 hours. No results for input(s): AMMONIA in the last 168 hours. Coagulation Profile: No results for input(s): INR, PROTIME in the last 168 hours. Cardiac Enzymes: Recent Labs  Lab 01/18/21 0451  CKTOTAL 204   BNP (last 3 results) No results for input(s): PROBNP in the last 8760 hours. HbA1C: Recent Labs    01/18/21 0451  HGBA1C 5.7*   CBG: Recent Labs  Lab 01/18/21 1133 01/18/21 1631 01/18/21 2133 01/19/21 0737 01/19/21 1138  GLUCAP 153* 113* 92 88 97   Lipid Profile: No results for input(s): CHOL, HDL, LDLCALC, TRIG, CHOLHDL, LDLDIRECT in the last 72 hours. Thyroid Function Tests: Recent Labs    01/19/21 0602  TSH 3.720   Anemia Panel: Recent Labs  01/18/21 0451  FERRITIN 57  TIBC 274  IRON 33   Sepsis Labs: No results for input(s): PROCALCITON, LATICACIDVEN in the last 168 hours.  Recent Results (from the past 240 hour(s))  SARS CORONAVIRUS 2 (TAT 6-24 HRS) Nasopharyngeal Nasopharyngeal Swab     Status: None   Collection Time: 01/17/21  2:24 PM   Specimen: Nasopharyngeal Swab  Result Value Ref Range Status   SARS Coronavirus 2 NEGATIVE NEGATIVE Final    Comment: (NOTE) SARS-CoV-2 target nucleic acids are NOT DETECTED.  The SARS-CoV-2 RNA is generally detectable in upper and lower respiratory specimens during the acute phase of infection. Negative results do not preclude SARS-CoV-2 infection, do not rule out co-infections with other pathogens, and should not be used as the sole basis for treatment or other patient management decisions. Negative results must be combined with clinical observations, patient history, and epidemiological information. The expected result is Negative.  Fact Sheet  for Patients: SugarRoll.be  Fact Sheet for Healthcare Providers: https://www.woods-mathews.com/  This test is not yet approved or cleared by the [place] FDA and  has been authorized for detection and/or diagnosis of SARS-CoV-2 by FDA under an Emergency Use Authorization (EUA). This EUA will remain  in effect (meaning this test can be used) for the duration of the COVID-19 declaration under Se ction 564(b)(1) of the Act, 21 U.S.C. section 360bbb-3(b)(1), unless the authorization is terminated or revoked sooner.  Performed at [facility], [place] [address] [place], [place] [address]      Radiology Studies: EEG  Result Date: 01/19/2021 Lora Havens, MD     01/19/2021  5:05 PM Patient Name: Kirsten Mcmahon MRN: [id number] Epilepsy Attending: Lora Havens Referring Physician/Provider: Dr Marylu Lund Date: 01/19/2021 Duration: 29.08 mins Patient history: 75yo F with ams. EEG to evaluate for seizure Level of alertness: Awake AEDs during EEG study: None Technical aspects: This EEG study was done with scalp electrodes positioned according to the 10-20 International system of electrode placement. Electrical activity was acquired at a sampling rate of 500Hz  and reviewed with a high frequency filter of 70Hz  and a low frequency filter of 1Hz . EEG data were recorded continuously and digitally stored. Description: The posterior dominant rhythm consists of 8 Hz activity of moderate voltage (25-35 uV) seen predominantly in posterior head regions, symmetric and reactive to eye opening and eye closing. EEG showed sharply contoured 6-7hz  theta slowing in right frontal region consistent with breech artifact. Single bilateral frontotemporal spike was also noted. Physiologic photic driving was not seen during photic stimulation.  Hyperventilation was not performed.   ABNORMALITY - Spike, bilateral frontotemporal - Continuous slow,  right frontal region -  Breech aritfact, right frontal region IMPRESSION: This study is showed evidence of potential epileptogenicity arising from bilateral frontotemporal region, There is also cortical dysfunction due to underlying stroke and craniotomy in right frontal region. No seizures were seen throughout the recording. Lora Havens   MR BRAIN WO CONTRAST  Result Date: 01/17/2021 CLINICAL DATA:  Hypertension and diabetes. Left-sided weakness. Fell today. EXAM: MRI HEAD WITHOUT CONTRAST TECHNIQUE: Multiplanar, multiecho pulse sequences of the brain and surrounding structures were obtained without intravenous contrast. COMPARISON:  Head CT earlier same day. FINDINGS: Brain: The study suffers from motion degradation. Diffusion imaging does not show any acute or subacute infarction. Old small vessel infarctions affect the cerebellum on both sides. Chronic small-vessel ischemic change of the pons. Wallerian degeneration on the right. Old small vessel infarctions of the thalami. Chronic small-vessel  ischemic changes of the cerebral hemispheric white matter. Old right insular and frontal operculum infarction with atrophy, encephalomalacia and gliosis. No evidence of recent hemorrhage, hydrocephalus or extra-axial collection. Vascular: Major vessels at the base of the brain show flow. Skull and upper cervical spine: Large right frontal craniotomy. Sinuses/Orbits: Clear/normal Other: None IMPRESSION: No acute finding by MRI. Extensive chronic small-vessel ischemic changes throughout the brain as outlined above. Old right insular and frontal operculum infarction with atrophy, encephalomalacia and gliosis. Wallerian degeneration on the right. Old cerebellar infarctions. Electronically Signed   By: Nelson Chimes M.D.   On: 01/17/2021 23:21   DG Chest Port 1 View  Result Date: 01/18/2021 CLINICAL DATA:  Aspiration. Stroke. Hypertension. Right breast cancer. EXAM: PORTABLE CHEST 1 VIEW COMPARISON:  07/23/2018 radiographs FINDINGS: Low  lung volumes are present, causing crowding of the pulmonary vasculature. Tortuous thoracic aorta. Indistinct margin of the left cardiac border, difficult to exclude a mild lingular airspace opacity. Clips along the right lower neck and right axilla. Left greater than right degenerative glenohumeral arthropathy. Indistinct pulmonary vasculature, possibly from pulmonary venous hypertension. No Kerley B lines to suggest overt interstitial edema. No appreciable blunting of the costophrenic angles. IMPRESSION: 1. Indistinct margin of the left cardiac border, difficult to exclude a mild lingular pneumonia although a prominent epicardial fat pad can sometimes cause a similar appearance. 2. Mild enlargement of the cardiopericardial silhouette with indistinct pulmonary vasculature, possibly from pulmonary venous hypertension. No overt edema. 3. Low lung volumes. 4. Left greater than right degenerative glenohumeral arthropathy.1 Electronically Signed   By: Van Clines M.D.   On: 01/18/2021 18:15    Scheduled Meds: . amLODipine  7.5 mg Oral Daily  . aspirin EC  81 mg Oral Daily  . cholecalciferol  1,000 Units Oral Daily  . heparin injection (subcutaneous)  5,000 Units Subcutaneous Q8H  . insulin aspart  0-5 Units Subcutaneous QHS  . insulin aspart  0-9 Units Subcutaneous TID WC  . latanoprost  1 drop Both Eyes QHS  . letrozole  2.5 mg Oral Daily  . levETIRAcetam  500 mg Oral BID  . levothyroxine  112 mcg Oral Q0600  . LORazepam  1 mg Intramuscular Once  . metoprolol succinate  12.5 mg Oral Daily   Continuous Infusions: . lactated ringers 75 mL/hr at 01/19/21 0627  . levETIRAcetam       LOS: 1 day   Marylu Lund, MD [facility] Pager On Amion  If 7PM-7AM, please contact night-coverage 01/19/2021, 5:40 PM

## 2021-01-19 NOTE — Progress Notes (Signed)
   01/19/21 1130  Clinical Encounter Type  Visited With Patient  Visit Type Initial;Spiritual support  Referral From Nurse  Consult/Referral To Chaplain  Spiritual Encounters  Spiritual Needs Prayer;Emotional  Chaplain Lyra Alaimo visited Pt in room 154A. It was a routine visit, and the Pt seemed happy to see a Chaplain. Her speech was slurred but I was able to understand her. I offered prayer and she said yes. I prayed for the Pt and told her I will try to come see her again soon.

## 2021-01-19 NOTE — Evaluation (Signed)
Physical Therapy Evaluation Patient Details Name: Kirsten Mcmahon MRN: [id number] DOB: 03-06-1946 Today's Date: 01/19/2021   History of Present Illness  75 y.o. black female with hypertension, CVA with residual left sided weakness, diabetes mellitus type II, hypothyroidism, hyperlipidemia, glaucoma, depression, seizure disorder and history of breast cancer who was admitted to [name] on 01/17/20[facility] for fall while transferring form wheelchair to toilet and landing on her face on the bathroom floor with swelling around left eye  Clinical Impression  Patient is agreeable to PT evaluation. Patient is confused at times but able to follow single step commands with extra time and speak in short phrases. Patient has residual left side weakness from previous stroke CVA and is wheelchair bound at baseline. Patient has trace movement in LLE with increased tone throughout. Assistance is required for bed mobility and to maintain sitting balance for approximately 5 minutes at edge of bed. Patient currently needs assistance with all mobility and would benefit from continued PT efforts to maximize independence and address functional limitations listed below. Recommend SNF placement at discharge at this time unless patient has physical assistance for all mobility at home.     Follow Up Recommendations SNF    Equipment Recommendations   (to be determined at next level of care)    Recommendations for Other Services       Precautions / Restrictions Precautions Precautions: Fall Restrictions Weight Bearing Restrictions: No      Mobility  Bed Mobility Overal bed mobility: Needs Assistance Bed Mobility: Supine to Sit;Sit to Supine Rolling: Max assist   Supine to sit: Max assist;HOB elevated Sit to supine: Max assist   General bed mobility comments: assistance required for trunk and BLE support. verbal cues for technique    Transfers                 General transfer comment: not attempted this  session  Ambulation/Gait                Stairs            Wheelchair Mobility    Modified Rankin (Stroke Patients Only)       Balance Overall balance assessment: Needs assistance Sitting-balance support: Feet supported;Single extremity supported Sitting balance-[name] Scale: Poor Sitting balance - Comments: poor initially progressing to fair with increased sitting time with RUE support                                     Pertinent Vitals/Pain Pain Assessment: No/denies pain    Home Living Family/patient expects to be discharged to:: Private residence Living Arrangements: Children Available Help at Discharge: Available PRN/intermittently;Family (multiple family members help per report) Type of Home: House Home Access: Ramped entrance     Home Layout: One level Home Equipment: Shower seat;Wheelchair - manual Additional Comments: information obtained from the chart    Prior Function Level of Independence: Needs assistance   Gait / Transfers Assistance Needed: primary means of mobility in wheehchair  ADL's / Homemaking Assistance Needed: required assistance for ADLs        Hand Dominance   Dominant Hand: Right    Extremity/Trunk Assessment   Upper Extremity Assessment Upper Extremity Assessment:  (residual left side weakness from previous CVA at baseline. see OT note for more details) RUE Deficits / Details: ~3/5; able to bring hand to face LUE Deficits / Details: 0/5    Lower Extremity Assessment Lower Extremity  Assessment: LLE deficits/detail;RLE deficits/detail RLE Deficits / Details: generalized weakness throughout. at least 3+/5 knee extension, ankle dorsiflexion LLE Deficits / Details: patient has residual left side weakness from previous stroke. patient has hypertonicity as well and unable to perform full PROM of hips/knee due to tone. patient does have trace quad and hip flexor movement. otherwise, no active movement noted LLE  Sensation:  (absent light touch per patient report. patient does withdrawal to painful stimuli at nail bed)       Communication   Communication: No difficulties  Cognition Arousal/Alertness: Awake/alert Behavior During Therapy:  (tearful at times) Overall Cognitive Status: Impaired/Different from baseline                                 General Comments: patient was able to speak in short phrases but has difficulty with word finding. patient is able to follow most single step commands with extra time      General Comments      Exercises     Assessment/Plan    PT Assessment Patient needs continued PT services  PT Problem List Decreased strength;Decreased range of motion;Decreased activity tolerance;Decreased balance;Decreased mobility;Impaired tone;Decreased safety awareness;Decreased knowledge of use of DME;Decreased cognition;Decreased coordination       PT Treatment Interventions DME instruction;Stair training;Functional mobility training;Therapeutic activities;Therapeutic exercise;Balance training;Neuromuscular re-education    PT Goals (Current goals can be found in the Care Plan section)  Acute Rehab PT Goals Patient Stated Goal: none stated PT Goal Formulation: Patient unable to participate in goal setting Time For Goal Achievement: 02/02/21 Potential to Achieve Goals: Fair    Frequency Min 2X/week   Barriers to discharge        Co-evaluation               AM-PAC PT "6 Clicks" Mobility  Outcome Measure Help needed turning from your back to your side while in a flat bed without using bedrails?: A Little Help needed moving from lying on your back to sitting on the side of a flat bed without using bedrails?: A Lot Help needed moving to and from a bed to a chair (including a wheelchair)?: A Lot Help needed standing up from a chair using your arms (e.g., wheelchair or bedside chair)?: Total Help needed to walk in hospital room?: Total Help needed  climbing 3-5 steps with a railing? : Total 6 Click Score: 10    End of Session   Activity Tolerance: Patient tolerated treatment well Patient left: in bed;with call bell/phone within reach;with chair alarm set Nurse Communication:  (white board up to date with mobility status) PT Visit Diagnosis: Muscle weakness (generalized) (M62.81);History of falling (Z91.81);Other abnormalities of gait and mobility (R26.89)    Time: 1050-1107 PT Time Calculation (min) (ACUTE ONLY): 17 min   Charges:   PT Evaluation $PT Eval Moderate Complexity: 1 Mod PT Treatments $Therapeutic Activity: 8-22 mins        Minna Merritts, PT, MPT   Percell Locus 01/19/2021, 2:05 PM

## 2021-01-19 NOTE — Evaluation (Signed)
Occupational Therapy Evaluation Patient Details Name: Kirsten Mcmahon MRN: [id number] DOB: 06-01-1946 Today's Date: 01/19/2021    History of Present Illness 75 y.o. female with medical history significant of hypertension, hyperlipidemia, diabetes mellitus, stroke with left-sided weakness, wheelchair-bound, GERD, hypothyroidism, depression, breast cancer, CKD stage IIIb, who presents with a fall.   Clinical Impression   Pt seen for OT evaluation this date. Upon arrival to room, pt asleep in bed, however easily awoken with verbal cues.  This session, pt able to follow one step commands consistently and answer ~50% of Y/N questions. Pt unable to vocalize more than 2 words/sentence. Due to communication difficulties, PLOF obtained from son via phone call. Prior to hospital admission, pt was using a wheelchair for functional mobility, receiving some assistance from family for dressing and bathing, and living in a 1-story home with daughter. This session, pt was able to engage RUE to wash face with washcloth, requiring only SET-UP assistance. Pt required MAX A to wash/dry hands in setting of LUE weakness. Pt appeared teary-eyed during ROM/MMT assessment, with pt indicating that she is significantly weaker than baseline, OT provided comfort. Pt would benefit from additional skilled OT services to maximize return to PLOF and minimize risk of future falls, injury, caregiver burden, and readmission. Discharge recommendation: SNF.   Follow Up Recommendations  SNF    Equipment Recommendations  Other (comment) (defer to next venue of care)       Precautions / Restrictions Precautions Precautions: Fall Restrictions Weight Bearing Restrictions: No      Mobility Bed Mobility Overal bed mobility: Needs Assistance Bed Mobility: Rolling Rolling: Max assist                                 ADL either performed or assessed with clinical judgement   ADL Overall ADL's : Needs  assistance/impaired     Grooming: Wash/dry face;Supervision/safety;Set up;Wash/dry hands;Maximal assistance;Bed level Grooming Details (indicate cue type and reason): Pt unable to engage LUE in grooming, requiring MAX A for wash/dry hands                                                  Pertinent Vitals/Pain Pain Assessment: No/denies pain        Extremity/Trunk Assessment Upper Extremity Assessment Upper Extremity Assessment: RUE deficits/detail;LUE deficits/detail RUE Deficits / Details: ~3/5; able to bring hand to face LUE Deficits / Details: 0/5   Lower Extremity Assessment Lower Extremity Assessment: Defer to PT evaluation          Cognition Arousal/Alertness: Awake/alert Behavior During Therapy: Flat affect Overall Cognitive Status: Impaired/Different from baseline                                 General Comments: Pt able to answer 50% of Y/N questions and follow one step commands consistently. Pt attempting to answer questions, however, unable to vocalize more than 2 words/sentence. Pt appeared teary-eyed during ROM/MMT assessment, with pt indicating that she is significantly weaker than baseline, pt provided comfort              Home Living Family/patient expects to be discharged to:: Private residence Living Arrangements: Children (Lives with daughter) Available Help at Discharge: Available PRN/intermittently;Family (Son and nephews help  PRN) Type of Home: House Home Access: Ramped entrance     Home Layout: One level     Bathroom Shower/Tub: Tub/shower unit         Home Equipment: Shower seat;Wheelchair - manual          Prior Functioning/Environment Level of Independence: Needs assistance  Gait / Transfers Assistance Needed: Per son, pt used wheelchair for functional mobility in/out of the home ADL's / Homemaking Assistance Needed: Per son, pt was receiving assistance from daughter for bathing and dressing             OT Problem List: Decreased strength;Decreased range of motion;Decreased activity tolerance;Impaired balance (sitting and/or standing);Decreased safety awareness      OT Treatment/Interventions: Self-care/ADL training;Therapeutic exercise;Energy conservation;DME and/or AE instruction;Therapeutic activities;Patient/family education;Balance training    OT Goals(Current goals can be found in the care plan section) Acute Rehab OT Goals Patient Stated Goal: none stated OT Goal Formulation: Patient unable to participate in goal setting Time For Goal Achievement: 02/02/21 Potential to Achieve Goals: Good ADL Goals Pt Will Perform Grooming: with min assist;sitting Pt Will Perform Upper Body Dressing: with min assist;sitting Additional ADL Goal #1: Perform bed>WC transfer with MOD A  OT Frequency: Min 2X/week    AM-PAC OT "6 Clicks" Daily Activity     Outcome Measure Help from another person eating meals?: None Help from another person taking care of personal grooming?: A Lot Help from another person toileting, which includes using toliet, bedpan, or urinal?: A Lot Help from another person bathing (including washing, rinsing, drying)?: A Lot Help from another person to put on and taking off regular upper body clothing?: Total Help from another person to put on and taking off regular lower body clothing?: Total 6 Click Score: 12   End of Session Nurse Communication: Mobility status  Activity Tolerance: Patient tolerated treatment well Patient left: in bed;with call bell/phone within reach;with bed alarm set  OT Visit Diagnosis: Muscle weakness (generalized) (M62.81)                Time: 0100-7121 OT Time Calculation (min): 15 min Charges:  OT General Charges $OT Visit: 1 Visit OT Evaluation $OT Eval Moderate Complexity: [place] D Kenner, OTR/L [place]

## 2021-01-20 DIAGNOSIS — R569 Unspecified convulsions: Secondary | ICD-10-CM

## 2021-01-20 DIAGNOSIS — T17908A Unspecified foreign body in respiratory tract, part unspecified causing other injury, initial encounter: Secondary | ICD-10-CM

## 2021-01-20 LAB — COMPREHENSIVE METABOLIC PANEL
ALT: 10 U/L (ref 0–44)
AST: 20 U/L (ref 15–41)
Albumin: 2.7 g/dL — ABNORMAL LOW (ref 3.5–5.0)
Alkaline Phosphatase: 55 U/L (ref 38–126)
Anion gap: 10 (ref 5–15)
BUN: 32 mg/dL — ABNORMAL HIGH (ref 8–23)
CO2: 21 mmol/L — ABNORMAL LOW (ref 22–32)
Calcium: 8.2 mg/dL — ABNORMAL LOW (ref 8.9–10.3)
Chloride: 111 mmol/L (ref 98–111)
Creatinine, Ser: 4.1 mg/dL — ABNORMAL HIGH (ref 0.44–1.00)
GFR, Estimated: 11 mL/min — ABNORMAL LOW (ref >60)
Glucose, Bld: 77 mg/dL (ref 70–99)
Potassium: 3.5 mmol/L (ref 3.5–5.1)
Sodium: 142 mmol/L (ref 135–145)
Total Bilirubin: 0.9 mg/dL (ref 0.3–1.2)
Total Protein: 6.4 g/dL — ABNORMAL LOW (ref 6.5–8.1)

## 2021-01-20 LAB — GLUCOSE, CAPILLARY
Glucose-Capillary: 59 mg/dL — ABNORMAL LOW (ref 70–99)
Glucose-Capillary: 97 mg/dL (ref 70–99)
Glucose-Capillary: 129 mg/dL — ABNORMAL HIGH (ref 70–99)
Glucose-Capillary: 169 mg/dL — ABNORMAL HIGH (ref 70–99)
Glucose-Capillary: 139 mg/dL — ABNORMAL HIGH (ref 70–99)

## 2021-01-20 MED ORDER — ENOXAPARIN SODIUM 80 MG/0.8ML ~~LOC~~ SOLN
1.0000 mg/kg | SUBCUTANEOUS | Status: DC
  Administered 2021-01-20 – 2021-01-21 (×2): 72.5 mg via SUBCUTANEOUS
  Filled 2021-01-20 (×2): qty 0.8

## 2021-01-20 MED ORDER — LEVETIRACETAM 500 MG PO TABS
500.0000 mg | ORAL_TABLET | ORAL | Status: DC
  Administered 2021-01-21: 500 mg via ORAL
  Filled 2021-01-20 (×2): qty 1

## 2021-01-20 NOTE — Progress Notes (Signed)
PROGRESS NOTE    Kirsten Mcmahon  TKZ:[id number] DOB: Mar 19, 1946 DOA: 01/17/2021 PCP: System, Provider Not In    Brief Narrative:  75 y.o. female with medical history significant of hypertension, hyperlipidemia, diabetes mellitus, stroke with left-sided weakness, wheelchair-bound, GERD, hypothyroidism, depression, breast cancer, CKD stage IIIb, who presents with a fall.  In the ED, pt was found to have WBC 18.3, COVID-19 PCR was neg, troponin level 7, worsening renal function, temperature normal, blood pressure 149/82, heart rate 98, RR 19, oxygen saturation 97% on room air.  X-ray of left hand is negative for bony fracture.  CT of the C-spine is negative for acute bony fracture. Nephrology was consulted  Assessment & Plan:   Principal Problem:   Fall Active Problems:   Malignant neoplasm of upper-outer quadrant of right breast in female, estrogen receptor positive ([place])   Hypertension   Hypothyroidism   Stroke ([place])   Anemia in chronic kidney disease (CKD)   Acute metabolic encephalopathy   Acute renal failure superimposed on stage 3b chronic kidney disease (HCC)   Type II diabetes mellitus with renal manifestations ([place])   ARF (acute renal failure) ([place])    Fall and acute metabolic encephalopathy: Likely multifactorial etiology, including worsening renal function and pain from eye injury. CT of head, CT of C-spine, CT of maxillary image did not show acute intracranial abnormalities or bony fracture.   -MRI brain was obtained and reviewed with Neurologist. Neg for acute infarct -PT/OT consulted, recommendation for SNF, TOC following  Malignant neoplasm of upper-outer quadrant of right breast in female, estrogen receptor positive ([place]) -Continue letrozole as pt tolerates  Hypertension -BP overall stable at this time -Patient is continued on amlodipine, metoprolol, clonidine patch -Continue with PRN hydralazine -Cozaar remains on hold secondary to presenting worsened renal  function  Prior hx of Stroke [facility]) -continue on aspirin  Hypothyroidism: -continue with synthroid -TSH is within normal limits  Anemia in chronic kidney disease (CKD):  -Hemoglobin slightly dropped from 9.8 on 10/16/2020 --> 9.3 at time of presentation -Hgb noted to be 9.0 on last check -Cont to follow CBC trends  Acute renal failure superimposed on stage 3b chronic kidney disease ([place]): Baseline hemoglobin 1.4-1.6 recently.  Presenting creatinine of 4.72, BUN 38. Likely due to dehydration and continuation of ARB - Pt is continued on IVF hydration - cont to avoid nephrotoxic agents - Continue to hold Cozaar as per above - US-renal reviewed. No hydronephrosis seen. Evidence of medical renal disease noted - Nephrology is following. Cr is slightly improved to 4.10 today -Repeat bmet in AM  Type II diabetes mellitus with renal manifestations [facility]): Recent A1c 8.0 on 07/07/2017, poorly controlled -Continue with sliding scale insulin -Hgb A1c noted to be 5.7  New seizures -Please see RRT documentation. Recently noted to have period of apparent unresponsiveness and groaning that was transient while being fed -Case was discussed with Neurology at that time, who recommended EEG -EEG was performed 2/14. Findings worrisome for potential epileptogenicity arising from B frontotemporal region -Have consulted and discussed case with Neurology with recommendation for keppra. Appreciate input by Neurology  DVT prophylaxis: SCD's Code Status: Full Family Communication: Pt in room, had updated pt's son over phone 2/14  Status is: Inpt  Pt will benefit from continued inpatient stay because  Unsafe d/c plan and Inpatient level of care appropriate due to severity of illness  Dispo: The patient is from: Home              Anticipated d/c  is to: Unclear at this time. Pending PT/OT eval              Anticipated d/c date is: 3 days              Patient currently is not medically stable to d/c.    Difficult to place patient No  Consultants:   Nephrology  Neurology  Procedures:     Antimicrobials: Anti-infectives (From admission, onward)   None      Subjective: Reports feeling better today. No complaints  Objective: Vitals:   01/20/21 0130 01/20/21 0414 01/20/21 0749 01/20/21 1111  BP:  133/84 (!) 149/93 (!) 147/90  Pulse: 89 78 89 97  Resp: 16 16 16 16   Temp: 97.6 F (36.4 C) 98.5 F (36.9 C) 97.9 F (36.6 C) 97.7 F (36.5 C)  TempSrc: Oral Oral    SpO2: 100% 100% 100% 99%  Weight:      Height:        Intake/Output Summary (Last 24 hours) at 01/20/2021 1545 Last data filed at 01/20/2021 1413 Gross per 24 hour  Intake 669.41 ml  Output 1750 ml  Net -1080.59 ml   Filed Weights   01/17/21 1136 01/17/21 1143  Weight: 72.6 kg 72 kg    Examination: General exam: Conversant, in no acute distress Respiratory system: normal chest rise, clear, no audible wheezing Cardiovascular system: regular rhythm, s1-s2 Gastrointestinal system: Nondistended, nontender, pos BS Central nervous system: No seizures, no tremors Extremities: No cyanosis, no joint deformities Skin: No rashes, no pallor Psychiatry: Affect normal // no auditory hallucinations   Data Reviewed: I have personally reviewed following labs and imaging studies  CBC: Recent Labs  Lab 01/17/21 1202 01/18/21 0451  WBC 10.3 11.1*  NEUTROABS 7.7 9.5*  HGB 9.3* 9.0*  HCT 29.3* 27.9*  MCV 91.3 91.2  PLT 290 086   Basic Metabolic Panel: Recent Labs  Lab 01/17/21 1202 01/18/21 0451 01/19/21 0602 01/20/21 0454  NA 142 142  142 142 142  K 4.0 4.0  4.0 4.1 3.5  CL 108 110  109 110 111  CO2 19* 20*  20* 21* 21*  GLUCOSE 108* 120*  118* 98 77  BUN 38* 39*  39* 34* 32*  CREATININE 4.72* 4.37*  4.38* 4.33* 4.10*  CALCIUM 8.7* 8.4*  8.5* 8.2* 8.2*  PHOS  --  4.5  --   --    GFR: Estimated Creatinine Clearance: 10.9 mL/min (A) (by C-G formula based on SCr of 4.1 mg/dL (H)). Liver  Function Tests: Recent Labs  Lab 01/17/21 1202 01/18/21 0451 01/19/21 0602 01/20/21 0454  AST 19 20 15 20   ALT 10 12 7 10   ALKPHOS 70 65 53 55  BILITOT 0.6 0.7 0.5 0.9  PROT 7.9 7.7 6.3* 6.4*  ALBUMIN 3.2* 3.2*  3.1* 2.5* 2.7*   No results for input(s): LIPASE, AMYLASE in the last 168 hours. No results for input(s): AMMONIA in the last 168 hours. Coagulation Profile: No results for input(s): INR, PROTIME in the last 168 hours. Cardiac Enzymes: Recent Labs  Lab 01/18/21 0451  CKTOTAL 204   BNP (last 3 results) No results for input(s): PROBNP in the last 8760 hours. HbA1C: Recent Labs    01/18/21 0451  HGBA1C 5.7*   CBG: Recent Labs  Lab 01/19/21 1138 01/19/21 2109 01/20/21 0752 01/20/21 0837 01/20/21 1148  GLUCAP 97 92 59* 97 129*   Lipid Profile: No results for input(s): CHOL, HDL, LDLCALC, TRIG, CHOLHDL, LDLDIRECT in the last 72 hours.  Thyroid Function Tests: Recent Labs    01/19/21 0602  TSH 3.720   Anemia Panel: Recent Labs    01/18/21 0451  FERRITIN 57  TIBC 274  IRON 33   Sepsis Labs: No results for input(s): PROCALCITON, LATICACIDVEN in the last 168 hours.  Recent Results (from the past 240 hour(s))  SARS CORONAVIRUS 2 (TAT 6-24 HRS) Nasopharyngeal Nasopharyngeal Swab     Status: None   Collection Time: 01/17/21  2:24 PM   Specimen: Nasopharyngeal Swab  Result Value Ref Range Status   SARS Coronavirus 2 NEGATIVE NEGATIVE Final    Comment: (NOTE) SARS-CoV-2 target nucleic acids are NOT DETECTED.  The SARS-CoV-2 RNA is generally detectable in upper and lower respiratory specimens during the acute phase of infection. Negative results do not preclude SARS-CoV-2 infection, do not rule out co-infections with other pathogens, and should not be used as the sole basis for treatment or other patient management decisions. Negative results must be combined with clinical observations, patient history, and epidemiological information. The  expected result is Negative.  Fact Sheet for Patients: SugarRoll.be  Fact Sheet for Healthcare Providers: https://www.woods-mathews.com/  This test is not yet approved or cleared by the [place] FDA and  has been authorized for detection and/or diagnosis of SARS-CoV-2 by FDA under an Emergency Use Authorization (EUA). This EUA will remain  in effect (meaning this test can be used) for the duration of the COVID-19 declaration under Se ction 564(b)(1) of the Act, 21 U.S.C. section 360bbb-3(b)(1), unless the authorization is terminated or revoked sooner.  Performed at [facility], [place] [address], [place], [place] [address]      Radiology Studies: EEG  Result Date: 01/19/2021 Kirsten Havens, MD     01/19/2021  5:05 PM Patient Name: Kirsten Mcmahon MRN: [id number] Epilepsy Attending: Lora Mcmahon Referring Physician/Provider: Dr Marylu Lund Date: 01/19/2021 Duration: 29.08 mins Patient history: 75yo F with ams. EEG to evaluate for seizure Level of alertness: Awake AEDs during EEG study: None Technical aspects: This EEG study was done with scalp electrodes positioned according to the 10-20 International system of electrode placement. Electrical activity was acquired at a sampling rate of 500Hz  and reviewed with a high frequency filter of 70Hz  and a low frequency filter of 1Hz . EEG data were recorded continuously and digitally stored. Description: The posterior dominant rhythm consists of 8 Hz activity of moderate voltage (25-35 uV) seen predominantly in posterior head regions, symmetric and reactive to eye opening and eye closing. EEG showed sharply contoured 6-7hz  theta slowing in right frontal region consistent with breech artifact. Single bilateral frontotemporal spike was also noted. Physiologic photic driving was not seen during photic stimulation.  Hyperventilation was not performed.   ABNORMALITY - Spike, bilateral frontotemporal -  Continuous slow,  right frontal region - Breech aritfact, right frontal region IMPRESSION: This study is showed evidence of potential epileptogenicity arising from bilateral frontotemporal region, There is also cortical dysfunction due to underlying stroke and craniotomy in right frontal region. No seizures were seen throughout the recording. Kirsten Mcmahon   DG Chest Port 1 View  Result Date: 01/18/2021 CLINICAL DATA:  Aspiration. Stroke. Hypertension. Right breast cancer. EXAM: PORTABLE CHEST 1 VIEW COMPARISON:  07/23/2018 radiographs FINDINGS: Low lung volumes are present, causing crowding of the pulmonary vasculature. Tortuous thoracic aorta. Indistinct margin of the left cardiac border, difficult to exclude a mild lingular airspace opacity. Clips along the right lower neck and right axilla. Left greater than right degenerative glenohumeral arthropathy.  Indistinct pulmonary vasculature, possibly from pulmonary venous hypertension. No Kerley B lines to suggest overt interstitial edema. No appreciable blunting of the costophrenic angles. IMPRESSION: 1. Indistinct margin of the left cardiac border, difficult to exclude a mild lingular pneumonia although a prominent epicardial fat pad can sometimes cause a similar appearance. 2. Mild enlargement of the cardiopericardial silhouette with indistinct pulmonary vasculature, possibly from pulmonary venous hypertension. No overt edema. 3. Low lung volumes. 4. Left greater than right degenerative glenohumeral arthropathy.1 Electronically Signed   By: Van Clines M.D.   On: 01/18/2021 18:15    Scheduled Meds: . amLODipine  7.5 mg Oral Daily  . aspirin EC  81 mg Oral Daily  . cholecalciferol  1,000 Units Oral Daily  . enoxaparin (LOVENOX) injection  1 mg/kg Subcutaneous Q24H  . insulin aspart  0-5 Units Subcutaneous QHS  . insulin aspart  0-9 Units Subcutaneous TID WC  . latanoprost  1 drop Both Eyes QHS  . letrozole  2.5 mg Oral Daily  . [START ON  01/21/2021] levETIRAcetam  500 mg Oral Q24H  . levothyroxine  112 mcg Oral Q0600  . LORazepam  1 mg Intramuscular Once  . metoprolol succinate  12.5 mg Oral Daily   Continuous Infusions: . lactated ringers 75 mL/hr at 01/20/21 0811     LOS: 2 days   Marylu Lund, MD [facility] Pager On Amion  If 7PM-7AM, please contact night-coverage 01/20/2021, 3:45 PM

## 2021-01-20 NOTE — Progress Notes (Signed)
Physical Therapy Treatment Patient Details Name: DELORIS MOGER MRN: [id number] DOB: 1945-12-28 Today's Date: 01/20/2021    History of Present Illness 75 y.o. black female with hypertension, CVA with residual left sided weakness, diabetes mellitus type II, hypothyroidism, hyperlipidemia, glaucoma, depression, seizure disorder and history of breast cancer who was admitted to [facility] on 01/17/2021 for fall while transferring form wheelchair to toilet and landing on her face on the bathroom floor with swelling around left eye    PT Comments    Patient is making progress towards meeting functional goals. With moderate assistance and cues for technique, patient able to stand pivot transfer from bed to recliner chair this session. Patient pleased to be OOB in the chair. Static sitting balance has also improved since last session. Recommend to continue PT to maximize independence and address functional mobility deficits. SNF is recommended at discharge.    Follow Up Recommendations  SNF     Equipment Recommendations   (to be determined at next level of care)    Recommendations for Other Services       Precautions / Restrictions Precautions Precautions: Fall Restrictions Weight Bearing Restrictions: No    Mobility  Bed Mobility Overal bed mobility: Needs Assistance Bed Mobility: Supine to Sit     Supine to sit: Mod assist;HOB elevated     General bed mobility comments: assistance for trunk support and occasional LLE support provided. verbal cues for technique and patient using bed rails for support    Transfers Overall transfer level: Needs assistance   Transfers: Stand Pivot Transfers   Stand pivot transfers: Mod assist       General transfer comment: verbal cues for pivot technique and hand placement from bed to chair going towards right side.  Ambulation/Gait                 Stairs             Wheelchair Mobility    Modified Rankin (Stroke Patients  Only)       Balance Overall balance assessment: Needs assistance Sitting-balance support: Feet supported;Single extremity supported Sitting balance-[name] Scale: Fair Sitting balance - Comments: sitting balance has improved since yesterday. patient is able to maintain static sitting balance with RUE support with stand by assistance                                    Cognition Arousal/Alertness: Awake/alert Behavior During Therapy: WFL for tasks assessed/performed Overall Cognitive Status: Within Functional Limits for tasks assessed                                 General Comments: patient speaking in full sentences today. patient able to follow single step commands consistently with extra time      Exercises      General Comments        Pertinent Vitals/Pain Pain Assessment: No/denies pain    Home Living                      Prior Function            PT Goals (current goals can now be found in the care plan section) Acute Rehab PT Goals Patient Stated Goal: none stated PT Goal Formulation: With patient Time For Goal Achievement: 02/02/21 Potential to Achieve Goals: Fair Progress towards PT goals: Progressing  toward goals    Frequency    Min 2X/week      PT Plan Current plan remains appropriate    Co-evaluation              AM-PAC PT "6 Clicks" Mobility   Outcome Measure  Help needed turning from your back to your side while in a flat bed without using bedrails?: A Little Help needed moving from lying on your back to sitting on the side of a flat bed without using bedrails?: A Lot Help needed moving to and from a bed to a chair (including a wheelchair)?: A Lot Help needed standing up from a chair using your arms (e.g., wheelchair or bedside chair)?: A Lot Help needed to walk in hospital room?: Total Help needed climbing 3-5 steps with a railing? : Total 6 Click Score: 11    End of Session Equipment Utilized  During Treatment: Gait belt Activity Tolerance: Patient tolerated treatment well Patient left: in chair;with call bell/phone within reach;with chair alarm set Nurse Communication: Mobility status PT Visit Diagnosis: Muscle weakness (generalized) (M62.81);History of falling (Z91.81);Other abnormalities of gait and mobility (R26.89)     Time: 0930-1000 PT Time Calculation (min) (ACUTE ONLY): 30 min  Charges:  $Therapeutic Activity: 23-37 mins                     Minna Merritts, PT, MPT   Percell Locus 01/20/2021, 12:35 PM

## 2021-01-20 NOTE — Consult Note (Signed)
NEUROLOGY CONSULTATION NOTE   Date of service: January 20, 2021 Patient Name: Kirsten Mcmahon MRN:  [id number] DOB:  [birth year] Reason for consult: "Concern for seizure" _ _ _   _ __   _ __ _ _  __ __   _ __   __ _  History of Present Illness  Kirsten Mcmahon is a 75 y.o. female with PMH significant for GERD, Glaucoma, HTN, Hypothyroidism, DM2, overactive bladder, hx of prior R insular and frontal operculum infarct with encephalomalacia and gliosis with residual left sided weakness, old cerebellar infarcts, malignant R breast cancer who is admitted with falls, acute metabolic encephalopathy and acute renal failure super imposed on CKD.  During this admission, patient had an episode of starring off unreponsive on 2/13. I was unable to get an accurate description from the notes. I was unabel to get in touch with any of the staff meembers who were working with her on that day. Per Dr. Wyline Copas who did not witness the event but got there soon after the event, he noted that Kirsten Mcmahon seemed encephalopathic (probably post-ictal) but not actively seizing RN reported her staring and grunting while she was trying to feed her and had the "agonal" appearing breathing.    Workup with routine EEG demonstrated bilateral frontotemporal spikes.  She was loaded with Keppra and started on Keppra 500 daily(adjusted for her kidney function).  Patient is awake, alert and pleasantly confused. She reports that she came to the hospital this morning. She does not know why she is in the hospital. She has no recollection of the event on 2/13, she was not even aware of that event. She denies any prior history of seizures in the past but not sure if she is a reliable historian. The documented significant event note mentions questionable seizure activity, aspiration, urinary incontinence. She was noted to be more alert as time passed on.   ROS   Constitutional Denies weight loss, fever and chills.  HEENT Denies changes  in vision and hearing.  Respiratory Denies SOB and cough.   CV Denies palpitations and CP   GI Denies abdominal pain, nausea, vomiting and diarrhea.   GU Denies dysuria and urinary frequency.   MSK Denies myalgia and joint pain.   Skin Denies rash and pruritus.   Neurological Denies headache and syncope.   Psychiatric Denies recent changes in mood. Denies anxiety and depression.    Past History   Past Medical History:  Diagnosis Date  . Cancer [facility]) 2017   Right breast  . Depression   . GERD (gastroesophageal reflux disease)   . Glaucoma   . Hemiparesis ([place])    left side  . High cholesterol   . History of seizure    x 1 - after a spider bite  . History of stroke with residual deficit    left-side weakness  . Hypertension    states BP under control with meds., has been on med. x 2 yr.  . Hypothyroidism   . Non-insulin dependent type 2 diabetes mellitus ([place])   . Overactive bladder   . Stroke [facility])    1998 weakness on left side   Past Surgical History:  Procedure Laterality Date  . ABDOMINAL HYSTERECTOMY     complete  . BREAST BIOPSY Left 08/03/2018   Benign adipose tissue  . BREAST EXCISIONAL BIOPSY Right 2014   Positive  . BREAST LUMPECTOMY Right   . CATARACT EXTRACTION W/ INTRAOCULAR LENS IMPLANT Left   . CEREBRAL ANEURYSM  REPAIR  1998  . PICC LINE INSERTION    . THYROID LOBECTOMY Right 07/15/2017   Procedure: RIGHT THYROID LOBECTOMY;  Surgeon: Armandina Gemma, MD;  Location: [facility] OR;  Service: General;  Laterality: Right;   Family History  Problem Relation Age of Onset  . Cancer Brother        possible prostate cancer per her daughter  . Breast cancer Neg Hx    Social History   Socioeconomic History  . Marital status: Single    Spouse name: Not on file  . Number of children: Not on file  . Years of education: Not on file  . Highest education level: Not on file  Occupational History  . Not on file  Tobacco Use  . Smoking status: Never Smoker  . Smokeless  tobacco: Never Used  Vaping Use  . Vaping Use: Never used  Substance and Sexual Activity  . Alcohol use: No  . Drug use: No  . Sexual activity: Never    Birth control/protection: Post-menopausal  Other Topics Concern  . Not on file  Social History Narrative   Lives with daughter    Social Determinants of Health   Financial Resource Strain: Not on file  Food Insecurity: Not on file  Transportation Needs: Not on file  Physical Activity: Not on file  Stress: Not on file  Social Connections: Not on file   Allergies  Allergen Reactions  . Levemir [Insulin Detemir] Itching    Medications   Medications Prior to Admission  Medication Sig Dispense Refill Last Dose  . alendronate (FOSAMAX) 70 MG tablet Take 1 tablet (70 mg total) by mouth once a week. Take with a full glass of water on an empty stomach. 12 tablet 0 Past Week at Unknown time  . amLODipine (NORVASC) 2.5 MG tablet Take 2.5 mg by mouth daily. Take one tablet (2.5 mg) by mouth along with one (5 mg) tablet for total 7.5 mg once daily   01/17/2021 at 1030  . amLODipine (NORVASC) 5 MG tablet Take 1 tablet (5 mg total) by mouth daily. (Patient taking differently: Take 5 mg by mouth daily. Take one tablet (5 mg) by mouth along with one (2.5 mg) tablet for total 7.5 mg once daily) 90 tablet 0 01/17/2021 at 1030  . aspirin EC 81 MG tablet Take 81 mg by mouth daily.   01/17/2021 at 1030  . cholecalciferol (VITAMIN D) 1000 units tablet Take 1,000 Units by mouth daily.   01/17/2021 at 1030  . latanoprost (XALATAN) 0.005 % ophthalmic solution Place 1 drop into both eyes at bedtime. 2.5 mL 0 01/16/2021 at 2100  . letrozole (FEMARA) 2.5 MG tablet Take 1 tablet (2.5 mg total) by mouth daily. 90 tablet 3 01/17/2021 at 1030  . levothyroxine (SYNTHROID, LEVOTHROID) 112 MCG tablet Take 1 tablet (112 mcg total) by mouth daily before breakfast. 90 tablet 0 01/17/2021 at 1030  . losartan (COZAAR) 100 MG tablet Take 1 tablet (100 mg total) by mouth  daily. 90 tablet 0 01/17/2021 at 1030  . metoprolol succinate (TOPROL-XL) 25 MG 24 hr tablet Take 12.5 mg by mouth daily.   01/17/2021 at 1030  . mirabegron ER (MYRBETRIQ) 25 MG TB24 tablet Take 1 tablet (25 mg total) by mouth daily. 90 tablet 0 01/17/2021 at 1030  . cloNIDine (CATAPRES - DOSED IN MG/24 HR) 0.2 mg/24hr patch Place 0.2 mg onto the skin once a week. Wednesday   01/14/2021  . loperamide (IMODIUM) 2 MG capsule Take 2 mg by  mouth 2 (two) times daily as needed for diarrhea or loose stools.   unknown at prn  . meclizine (ANTIVERT) 25 MG tablet Take 25 mg by mouth daily as needed for dizziness.   unknown at prn  . Probiotic Product (PROBIOTIC DAILY PO) Take by mouth. (Patient not taking: No sig reported)   Not Taking at Unknown time  . TYLENOL 325 MG tablet Take 650 mg by mouth daily as needed for pain.   unknown at prn     Vitals   Vitals:   01/20/21 0130 01/20/21 0414 01/20/21 0749 01/20/21 1111  BP:  133/84 (!) 149/93 (!) 147/90  Pulse: 89 78 89 97  Resp: 16 16 16 16   Temp: 97.6 F (36.4 C) 98.5 F (36.9 C) 97.9 F (36.6 C) 97.7 F (36.5 C)  TempSrc: Oral Oral    SpO2: 100% 100% 100% 99%  Weight:      Height:         Body mass index is 29.03 kg/m.  Physical Exam   General: Laying comfortably in bed; in no acute distress.  HENT: Normal oropharynx and mucosa. Normal external appearance of ears and nose.  Neck: Supple, no pain or tenderness  CV: No JVD. No peripheral edema.  Pulmonary: Symmetric Chest rise. Normal respiratory effort.  Abdomen: Soft to touch, non-tender.  Ext: No cyanosis, edema, or deformity  Skin: No rash. Normal palpation of skin.   Musculoskeletal: Normal digits and nails by inspection. No clubbing.   Neurologic Examination  Mental status/Cognition: Alert, oriented to self, place but not to month and year. Eating her lunch. Speech/language: Fluent, comprehension intact, object naming intact, repetition intact. Cranial nerves:   CN II Pupils equal  and reactive to light, no VF deficits   CN III,IV,VI EOM intact, no gaze preference or deviation, no nystagmus    CN V normal sensation in V1, V2, and V3 segments bilaterally    CN VII no asymmetry, no nasolabial fold flattening    CN VIII normal hearing to speech    CN IX & X normal palatal elevation, no uvular deviation    CN XI    CN XII midline tongue protrusion    Motor:  Muscle bulk: decreased in LUE, tone mildly increased in LUE, tremor none  She wanted to eat her lunch and declined participation in full strength testing today but reports that she is weak in LUE and LLE. She is able to lift her left leg off the bed but minimal movement in LUE. She is 5/5 on grip strength and push and pull in RUE and 5/5 on dorsiflexion and planter flexion in RLE and 5/5 in R hip flexion.  Sensation:  Light touch Decreased on left compared to right.   Pin prick    Temperature    Vibration   Proprioception    Coordination/Complex Motor:  - Finger to Nose intact on the right  Labs   CBC:  Recent Labs  Lab 01/17/21 1202 01/18/21 0451  WBC 10.3 11.1*  NEUTROABS 7.7 9.5*  HGB 9.3* 9.0*  HCT 29.3* 27.9*  MCV 91.3 91.2  PLT 290 518    Basic Metabolic Panel:  Lab Results  Component Value Date   NA 142 01/20/2021   K 3.5 01/20/2021   CO2 21 (L) 01/20/2021   GLUCOSE 77 01/20/2021   BUN 32 (H) 01/20/2021   CREATININE 4.10 (H) 01/20/2021   CALCIUM 8.2 (L) 01/20/2021   GFRNONAA 11 (L) 01/20/2021   GFRAA 34 (L) 08/01/2018  Lipid Panel: No results found for: LDLCALC HgbA1c:  Lab Results  Component Value Date   HGBA1C 5.7 (H) 01/18/2021   Urine Drug Screen: No results found for: LABOPIA, COCAINSCRNUR, LABBENZ, AMPHETMU, THCU, LABBARB  Alcohol Level No results found for: [facility]   MRI Brain  No acute finding by MRI. Extensive chronic small-vessel ischemic changes throughout the brain as outlined above. Old right insular and frontal operculum infarction with atrophy, encephalomalacia  and gliosis. Wallerian degeneration on the right. Old cerebellar infarctions.  rEEG:  ABNORMALITY - Spike, bilateral frontotemporal - Continuous slow,  right frontal region - Breech aritfact, right frontal region  IMPRESSION: This study is showed evidence of potential epileptogenicity arising from bilateral frontotemporal region, There is also cortical dysfunction due to underlying stroke and craniotomy in right frontal region. No seizures were seen throughout the recording.   Impression   Kirsten Mcmahon is a 76 y.o. female with PMH significant for GERD, Glaucoma, HTN, Hypothyroidism, DM2, overactive bladder, hx of prior R insular and frontal operculum infarct with encephalomalacia and gliosis with residual left sided weakness, old cerebellar infarcts, malignant R breast cancer who is admitted with falls, acute metabolic encephalopathy and acute renal failure super imposed on CKD. We were asked to evaluate her for a brief episode of unresponsiveness with gradual return to her baseline. rEEG demonstrated bilateral frontotemporal spikes. Workup with MRI Brain with no new stroke. No fever or signs of meningitis on Physical exam.  Clinically difficult to determine based on the limited history if the event was a seizure. Given EEG findings along with known large R insular/frontal stroke, will start her on Keppra renally adjusted at 500mg  daily.  Recommendations  - Keppra 500mg  daily - Follow up with Neurology outpatient. - No driving until free of any seizure events for 6 months. (full precautions listed below) - Neurology inpatient team will signoff. Please feel free to contact us with any questions or concerns. ______________________________________________________________________  Seizure precautions: Per [facility] statutes, patients with seizures are not allowed to drive until they have been seizure-free for six months and cleared by a physician    Use caution when using  heavy equipment or power tools. Avoid working on ladders or at heights. Take showers instead of baths. Ensure the water temperature is not too high on the home water heater. Do not go swimming alone. Do not lock yourself in a room alone (i.e. bathroom). When caring for infants or small children, sit down when holding, feeding, or changing them to minimize risk of injury to the child in the event you have a seizure. Maintain good sleep hygiene. Avoid alcohol.    If patient has another seizure, call 911 and bring them back to the ED if: A.  The seizure lasts longer than 5 minutes.      B.  The patient doesn't wake shortly after the seizure or has new problems such as difficulty seeing, speaking or moving following the seizure C.  The patient was injured during the seizure D.  The patient has a temperature over 102 F (39C) E.  The patient vomited during the seizure and now is having trouble breathing    During the Seizure   - First, ensure adequate ventilation and place patients on the floor on their left side  Loosen clothing around the neck and ensure the airway is patent. If the patient is clenching the teeth, do not force the mouth open with any object as this can cause severe damage - Remove all  items from the surrounding that can be hazardous. The patient may be oblivious to what's happening and may not even know what he or she is doing. If the patient is confused and wandering, either gently guide him/her away and block access to outside areas - Reassure the individual and be comforting - Call 911. In most cases, the seizure ends before EMS arrives. However, there are cases when seizures may last over 3 to 5 minutes. Or the individual may have developed breathing difficulties or severe injuries. If a pregnant patient or a person with diabetes develops a seizure, it is prudent to call an ambulance. - Finally, if the patient does not regain full consciousness, then call EMS. Most patients will  remain confused for about 45 to 90 minutes after a seizure, so you must use judgment in calling for help. - Avoid restraints but make sure the patient is in a bed with padded side rails - Place the individual in a lateral position with the neck slightly flexed; this will help the saliva drain from the mouth and prevent the tongue from falling backward - Remove all nearby furniture and other hazards from the area - Provide verbal assurance as the individual is regaining consciousness - Provide the patient with privacy if possible - Call for help and start treatment as ordered by the caregiver    After the Seizure (Postictal Stage)   After a seizure, most patients experience confusion, fatigue, muscle pain and/or a headache. Thus, one should permit the individual to sleep. For the next few days, reassurance is essential. Being calm and helping reorient the person is also of importance.   Most seizures are painless and end spontaneously. Seizures are not harmful to others but can lead to complications such as stress on the lungs, brain and the heart. Individuals with prior lung problems may develop labored breathing and respiratory distress.   Thank you for the opportunity to take part in the care of this patient. If you have any further questions, please contact the neurology consultation attending.  Signed,  [place] Pager Number [phone] _ _ _   _ __   _ __ _ _  __ __   _ __   __ _

## 2021-01-20 NOTE — Progress Notes (Addendum)
[name]  ROUNDING NOTE   Subjective:   Patient is seen sitting in a chair She is alert and confused at times She was able to eat 50% of breakfast Denies nausea and vomiting Denies shortness of breath and chest pain  UOP-1.75L LR @75  ml/hr   Objective:  Vital signs in last 24 hours:  Temp:  [97.6 F (36.4 C)-98.5 F (36.9 C)] 97.7 F (36.5 C) (02/15 1111) Pulse Rate:  [78-97] 97 (02/15 1111) Resp:  [16-18] 16 (02/15 1111) BP: (133-149)/(78-93) 147/90 (02/15 1111) SpO2:  [99 %-100 %] 99 % (02/15 1111)  Weight change:  Filed Weights   01/17/21 1136 01/17/21 1143  Weight: 72.6 kg 72 kg    Intake/Output: I/O last 3 completed shifts: In: 1597.6 [I.V.:1597.6] Out: 2150 [Urine:2150]   Intake/Output this shift:  No intake/output data recorded.  Physical Exam: General: NAD, sitting in chair  Head:  Swelling around left eye. Moist oral mucosal membranes  Eyes: Anicteric  Neck: Supple, trachea midline  Lungs:  Clear to auscultation  Heart: Regular rate and rhythm  Abdomen:  Soft, nontender   Extremities:  No peripheral edema.  Neurologic: Alert, able to answer questions  Skin: No lesions       Basic Metabolic Panel: Recent Labs  Lab 01/17/21 1202 01/18/21 0451 01/19/21 0602 01/20/21 0454  NA 142 142  142 142 142  K 4.0 4.0  4.0 4.1 3.5  CL 108 110  109 110 111  CO2 19* 20*  20* 21* 21*  GLUCOSE 108* 120*  118* 98 77  BUN 38* 39*  39* 34* 32*  CREATININE 4.72* 4.37*  4.38* 4.33* 4.10*  CALCIUM 8.7* 8.4*  8.5* 8.2* 8.2*  PHOS  --  4.5  --   --     Liver Function Tests: Recent Labs  Lab 01/17/21 1202 01/18/21 0451 01/19/21 0602 01/20/21 0454  AST 19 20 15 20   ALT 10 12 7 10   ALKPHOS 70 65 53 55  BILITOT 0.6 0.7 0.5 0.9  PROT 7.9 7.7 6.3* 6.4*  ALBUMIN 3.2* 3.2*  3.1* 2.5* 2.7*   No results for input(s): LIPASE, AMYLASE in the last 168 hours. No results for input(s): AMMONIA in the last 168 hours.  CBC: Recent Labs   Lab 01/17/21 1202 01/18/21 0451  WBC 10.3 11.1*  NEUTROABS 7.7 9.5*  HGB 9.3* 9.0*  HCT 29.3* 27.9*  MCV 91.3 91.2  PLT 290 246    Cardiac Enzymes: Recent Labs  Lab 01/18/21 0451  CKTOTAL 204    BNP: Invalid input(s): POCBNP  CBG: Recent Labs  Lab 01/19/21 1138 01/19/21 2109 01/20/21 0752 01/20/21 0837 01/20/21 1148  GLUCAP 97 92 59* 97 129*    Microbiology: Results for orders placed or performed during the hospital encounter of 01/17/21  SARS CORONAVIRUS 2 (TAT 6-24 HRS) Nasopharyngeal Nasopharyngeal Swab     Status: None   Collection Time: 01/17/21  2:24 PM   Specimen: Nasopharyngeal Swab  Result Value Ref Range Status   SARS Coronavirus 2 NEGATIVE NEGATIVE Final    Comment: (NOTE) SARS-CoV-2 target nucleic acids are NOT DETECTED.  The SARS-CoV-2 RNA is generally detectable in upper and lower respiratory specimens during the acute phase of infection. Negative results do not preclude SARS-CoV-2 infection, do not rule out co-infections with other pathogens, and should not be used as the sole basis for treatment or other patient management decisions. Negative results must be combined with clinical observations, patient history, and epidemiological information. The expected result is  Negative.  Fact Sheet for Patients: SugarRoll.be  Fact Sheet for Healthcare Providers: https://www.woods-mathews.com/  This test is not yet approved or cleared by the [place] FDA and  has been authorized for detection and/or diagnosis of SARS-CoV-2 by FDA under an Emergency Use Authorization (EUA). This EUA will remain  in effect (meaning this test can be used) for the duration of the COVID-19 declaration under Se ction 564(b)(1) of the Act, 21 U.S.C. section 360bbb-3(b)(1), unless the authorization is terminated or revoked sooner.  Performed at [facility], [place] [address] [place], [place] [address]     Coagulation  Studies: No results for input(s): LABPROT, INR in the last 72 hours.  Urinalysis: Recent Labs    01/17/21 1733  COLORURINE STRAW*  LABSPEC 1.009  PHURINE 6.0  GLUCOSEU NEGATIVE  HGBUR SMALL*  BILIRUBINUR NEGATIVE  KETONESUR NEGATIVE  PROTEINUR 100*  NITRITE NEGATIVE  LEUKOCYTESUR NEGATIVE      Imaging: EEG  Result Date: 01/19/2021 Lora Havens, MD     01/19/2021  5:05 PM Patient Name: Kirsten Mcmahon MRN: [id number] Epilepsy Attending: Lora Havens Referring Physician/Provider: Dr Marylu Lund Date: 01/19/2021 Duration: 29.08 mins Patient history: 75yo F with ams. EEG to evaluate for seizure Level of alertness: Awake AEDs during EEG study: None Technical aspects: This EEG study was done with scalp electrodes positioned according to the 10-20 International system of electrode placement. Electrical activity was acquired at a sampling rate of 500Hz  and reviewed with a high frequency filter of 70Hz  and a low frequency filter of 1Hz . EEG data were recorded continuously and digitally stored. Description: The posterior dominant rhythm consists of 8 Hz activity of moderate voltage (25-35 uV) seen predominantly in posterior head regions, symmetric and reactive to eye opening and eye closing. EEG showed sharply contoured 6-7hz  theta slowing in right frontal region consistent with breech artifact. Single bilateral frontotemporal spike was also noted. Physiologic photic driving was not seen during photic stimulation.  Hyperventilation was not performed.   ABNORMALITY - Spike, bilateral frontotemporal - Continuous slow,  right frontal region - Breech aritfact, right frontal region IMPRESSION: This study is showed evidence of potential epileptogenicity arising from bilateral frontotemporal region, There is also cortical dysfunction due to underlying stroke and craniotomy in right frontal region. No seizures were seen throughout the recording. Lora Havens   DG Chest Port 1 View  Result Date:  01/18/2021 CLINICAL DATA:  Aspiration. Stroke. Hypertension. Right breast cancer. EXAM: PORTABLE CHEST 1 VIEW COMPARISON:  07/23/2018 radiographs FINDINGS: Low lung volumes are present, causing crowding of the pulmonary vasculature. Tortuous thoracic aorta. Indistinct margin of the left cardiac border, difficult to exclude a mild lingular airspace opacity. Clips along the right lower neck and right axilla. Left greater than right degenerative glenohumeral arthropathy. Indistinct pulmonary vasculature, possibly from pulmonary venous hypertension. No Kerley B lines to suggest overt interstitial edema. No appreciable blunting of the costophrenic angles. IMPRESSION: 1. Indistinct margin of the left cardiac border, difficult to exclude a mild lingular pneumonia although a prominent epicardial fat pad can sometimes cause a similar appearance. 2. Mild enlargement of the cardiopericardial silhouette with indistinct pulmonary vasculature, possibly from pulmonary venous hypertension. No overt edema. 3. Low lung volumes. 4. Left greater than right degenerative glenohumeral arthropathy.1 Electronically Signed   By: Van Clines M.D.   On: 01/18/2021 18:15     Medications:   . lactated ringers 75 mL/hr at 01/20/21 0811   . amLODipine  7.5 mg Oral Daily  .  aspirin EC  81 mg Oral Daily  . cholecalciferol  1,000 Units Oral Daily  . enoxaparin (LOVENOX) injection  1 mg/kg Subcutaneous Q24H  . insulin aspart  0-5 Units Subcutaneous QHS  . insulin aspart  0-9 Units Subcutaneous TID WC  . latanoprost  1 drop Both Eyes QHS  . letrozole  2.5 mg Oral Daily  . [START ON 01/21/2021] levETIRAcetam  500 mg Oral Q24H  . levothyroxine  112 mcg Oral Q0600  . LORazepam  1 mg Intramuscular Once  . metoprolol succinate  12.5 mg Oral Daily   acetaminophen **OR** acetaminophen, hydrALAZINE, loperamide, meclizine  Assessment/ Plan:  Kirsten Mcmahon is a 75 y.o. black female with hypertension, CVA with residual left  sided weakness, diabetes mellitus type II, hypothyroidism, hyperlipidemia, glaucoma, depression, seizure disorder and history of breast cancer who was admitted to [facility] on 01/17/2021 for Fall [W19.XXXA]  1. Chronic kidney disease stage V:  baseline creatinine of 3.97, GFR of 10 on 10/16/2020.  Followed by [facility], Dr. Radene Knee.  Continued creatinine improvement. Crt-4.10 No acute need for dialysis at this time. Will continue to monitor labs  2. Metabolic acidosis  -managed by primary team  3. Anemia of chronic kidney disease:  Hgb 9.0.  Will monitor this result  4. Diabetes Mellitus Type 2 with renal manifestations -HBG A1c-5.7 -Hypoglycemic this morning to 59.  -Treated and rebounded to 97     LOS: 2 Colon Flattery, NP 2/15/20222:11 PM

## 2021-01-20 NOTE — Plan of Care (Signed)

## 2021-01-20 NOTE — Progress Notes (Signed)
BG check 59.  Orange juice given.  Will recheck BG level again in 15 minutes.

## 2021-01-20 NOTE — NC FL2 (Signed)
[place] LEVEL OF CARE SCREENING TOOL     IDENTIFICATION  Patient Name: Kirsten Mcmahon Birthdate: 1946-04-26 Sex: female Admission Date (Current Location): 01/17/2021  [address] and [place] Number:  Engineering geologist and Address:  [facility], [address] [place], [place] [address]      Provider Number: [id number]  Attending Physician Name and Address:  Donne Hazel, MD  Relative Name and Phone Number:  Khalia Gong [phone]    Current Level of Care: Hospital Recommended Level of Care: [place] Prior Approval Number:    Date Approved/Denied:   PASRR Number: [id number]  Discharge Plan: SNF    Current Diagnoses: Patient Active Problem List   Diagnosis Date Noted  . ARF (acute renal failure) ([place]) 01/18/2021  . Fall 01/17/2021  . Hypertension   . Hypothyroidism   . Stroke ([place])   . GERD (gastroesophageal reflux disease)   . Anemia in chronic kidney disease (CKD)   . Acute metabolic encephalopathy   . Acute renal failure superimposed on stage 3b chronic kidney disease ([place])   . Type II diabetes mellitus with renal manifestations ([place])   . Abnormal mammogram of left breast 07/30/2018  . Closed displaced oblique fracture of shaft of left humerus 03/14/2018  . Osteopenia 01/20/2018  . Multiple thyroid nodules 07/13/2017  . Tracheal deviation 07/13/2017  . Malignant neoplasm of upper-outer quadrant of right breast in female, estrogen receptor positive ([place]) 01/20/2017  . Primary vulvar squamous cell carcinoma ([place]) 01/20/2017    Orientation RESPIRATION BLADDER Height & Weight     Self,Place  Normal External catheter Weight: 72 kg Height:  5\' 2"  (157.5 cm)  BEHAVIORAL SYMPTOMS/MOOD NEUROLOGICAL BOWEL NUTRITION STATUS      Continent Diet (Dysphagia 2)  AMBULATORY STATUS COMMUNICATION OF NEEDS Skin   Extensive Assist  (Aphasic at this time) Normal                       Personal Care  Assistance Level of Assistance  Bathing,Feeding,Dressing Bathing Assistance: Maximum assistance Feeding assistance: Limited assistance Dressing Assistance: Maximum assistance     Functional Limitations Info  Sight,Hearing,Speech     Speech Info: Impaired    SPECIAL CARE FACTORS FREQUENCY  PT (By licensed PT),OT (By licensed OT)                    Contractures Contractures Info: Not present    Additional Factors Info  Code Status,Allergies Code Status Info: Full Allergies Info: Levemir           Current Medications (01/20/2021):  This is the current hospital active medication list Current Facility-Administered Medications  Medication Dose Route Frequency Provider Last Rate Last Admin  . acetaminophen (TYLENOL) tablet 650 mg  650 mg Oral Q6H PRN Ivor Costa, MD       Or  . acetaminophen (TYLENOL) suppository 650 mg  650 mg Rectal Q6H PRN Ivor Costa, MD      . amLODipine (NORVASC) tablet 7.5 mg  7.5 mg Oral Daily Ivor Costa, MD   7.5 mg at 01/20/21 0814  . aspirin EC tablet 81 mg  81 mg Oral Daily Ivor Costa, MD   81 mg at 01/20/21 2482  . cholecalciferol (VITAMIN D3) tablet 1,000 Units  1,000 Units Oral Daily Ivor Costa, MD   1,000 Units at 01/20/21 0815  . heparin injection 5,000 Units  5,000 Units Subcutaneous Q8H Donne Hazel, MD   5,000 Units at  01/20/21 0555  . hydrALAZINE (APRESOLINE) injection 5 mg  5 mg Intravenous Q2H PRN Ivor Costa, MD      . insulin aspart (novoLOG) injection 0-5 Units  0-5 Units Subcutaneous QHS Ivor Costa, MD      . insulin aspart (novoLOG) injection 0-9 Units  0-9 Units Subcutaneous TID WC Ivor Costa, MD   2 Units at 01/17/21 1747  . lactated ringers infusion   Intravenous Continuous Ivor Costa, MD 75 mL/hr at 01/20/21 0811 New Bag at 01/20/21 0811  . latanoprost (XALATAN) 0.005 % ophthalmic solution 1 drop  1 drop Both Eyes QHS Ivor Costa, MD   1 drop at 01/19/21 2202  . letrozole [facility]) tablet 2.5 mg  2.5 mg Oral Daily Ivor Costa, MD    2.5 mg at 01/20/21 0817  . levETIRAcetam (KEPPRA) tablet 500 mg  500 mg Oral BID Donnetta Simpers, MD   500 mg at 01/20/21 0819  . levothyroxine (SYNTHROID) tablet 112 mcg  112 mcg Oral Q0600 Ivor Costa, MD   112 mcg at 01/20/21 0556  . loperamide (IMODIUM) capsule 2 mg  2 mg Oral BID PRN Ivor Costa, MD      . LORazepam (ATIVAN) injection 1 mg  1 mg Intramuscular Once Sharion Settler, NP      . meclizine (ANTIVERT) tablet 25 mg  25 mg Oral Daily PRN Ivor Costa, MD   25 mg at 01/20/21 0818  . metoprolol succinate (TOPROL-XL) 24 hr tablet 12.5 mg  12.5 mg Oral Daily Ivor Costa, MD   12.5 mg at 01/20/21 9702     Discharge Medications: Please see discharge summary for a list of discharge medications.  Relevant Imaging Results:  Relevant Lab Results:   Additional Information SS# [id number]  Shelbie Ammons, RN

## 2021-01-20 NOTE — Progress Notes (Signed)
ANTICOAGULATION CONSULT NOTE - Initial Consult  Pharmacy Consult for Lovenox Indication: DVT  (femoral DVT incidentallyl seen on initial CT)  Allergies  Allergen Reactions  . Levemir [Insulin Detemir] Itching    Patient Measurements: Height: 5\' 2"  (157.5 cm) Weight: 72 kg (158 lb 11.7 oz) IBW/kg (Calculated) : 50.1 Heparin Dosing Weight:   Vital Signs: Temp: 97.7 F (36.5 C) (02/15 1111) Temp Source: Oral (02/15 0414) BP: 147/90 (02/15 1111) Pulse Rate: 97 (02/15 1111)  Labs: Recent Labs    01/17/21 1202 01/18/21 0451 01/19/21 0602 01/20/21 0454  HGB 9.3* 9.0*  --   --   HCT 29.3* 27.9*  --   --   PLT 290 246  --   --   CREATININE 4.72* 4.37*  4.38* 4.33* 4.10*  CKTOTAL  --  204  --   --   TROPONINIHS 7  --   --   --     Estimated Creatinine Clearance: 10.9 mL/min (A) (by C-G formula based on SCr of 4.1 mg/dL (H)).   Medical History: Past Medical History:  Diagnosis Date  . Cancer [facility]) 2017   Right breast  . Depression   . GERD (gastroesophageal reflux disease)   . Glaucoma   . Hemiparesis ([place])    left side  . High cholesterol   . History of seizure    x 1 - after a spider bite  . History of stroke with residual deficit    left-side weakness  . Hypertension    states BP under control with meds., has been on med. x 2 yr.  . Hypothyroidism   . Non-insulin dependent type 2 diabetes mellitus ([place])   . Overactive bladder   . Stroke [facility])    1998 weakness on left side    Medications:  Scheduled:  . amLODipine  7.5 mg Oral Daily  . aspirin EC  81 mg Oral Daily  . cholecalciferol  1,000 Units Oral Daily  . enoxaparin (LOVENOX) injection  1 mg/kg Subcutaneous Q24H  . insulin aspart  0-5 Units Subcutaneous QHS  . insulin aspart  0-9 Units Subcutaneous TID WC  . latanoprost  1 drop Both Eyes QHS  . letrozole  2.5 mg Oral Daily  . levETIRAcetam  500 mg Oral BID  . levothyroxine  112 mcg Oral Q0600  . LORazepam  1 mg Intramuscular Once  . metoprolol  succinate  12.5 mg Oral Daily   Infusions:  . lactated ringers 75 mL/hr at 01/20/21 2035    Assessment: 75 yo F admitted w/ fall, AMS. Patient with history of breast cancer, stroke, CKD followed by nephrology. Femoral DVT incidentallyl seen on initial CT. Transition from DVT prophylaxis with Heparin subQ to DVT treatment  With Lovenox Hgb on 2/13: 9.0  plt 246  Goal of Therapy:  Monitor platelets by anticoagulation protocol: Yes   Plan:  Lovenox 1 mg/kg (72.5 mg) subcutaneously Q24h for DVT treatment.   For Crcl 10.9 ml/min. Monitor renal fxn, CBC daily   Derick Seminara A 01/20/2021,11:58 AM

## 2021-01-20 NOTE — TOC Initial Note (Signed)
Transition of Care [facility]) - Initial/Assessment Note    Patient Details  Name: Kirsten Mcmahon MRN: [id number] Date of Birth: [birth year]  [facility]) CM/SW Contact:    Shelbie Ammons, RN Phone Number: 01/20/2021, 9:04 AM  Clinical Narrative:   RNCM reached out to patient's daughter Tami Ribas due to patient's current aphasia issues. Patient is from home where she is followed by [facility]. According to patient's daughter she is essentially wheelchair bound but up until recently has been able to mainly transfer herself. She has become very weak. Her PCP as well as transportation and medications are handled through [facility] as well. Discussed current recommendations for short term rehab at discharge and daughter is agreeable. She does not have any initial preferences and is ok with sending to all local facilities that [facility] is contracted with.  RNCM verified PASSR, completed FL2 and sent bed requests to [facility], [facility] and [facility].           Expected Discharge Plan: [place] Barriers to Discharge: Continued Medical Work up   Patient Goals and CMS Choice        Expected Discharge Plan and Services Expected Discharge Plan: [place] arrangements for the past 2 months: Mobile Home                                      Prior Living Arrangements/Services Living arrangements for the past 2 months: Mobile Home Lives with:: Adult Children Patient language and need for interpreter reviewed:: Yes Do you feel safe going back to the place where you live?: Yes      Need for Family Participation in Patient Care: Yes (Comment) Care giver support system in place?: Yes (comment) 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) Criminal Activity/Legal Involvement Pertinent to Current Situation/Hospitalization: No - Comment as needed  Activities of Daily Living Home Assistive Devices/Equipment: Cane (specify quad or  straight),Wheelchair,Blood pressure cuff,Eyeglasses ADL Screening (condition at time of admission) Patient's cognitive ability adequate to safely complete daily activities?: No Is the patient deaf or have difficulty hearing?: No Does the patient have difficulty seeing, even when wearing glasses/contacts?: Yes Does the patient have difficulty concentrating, remembering, or making decisions?: No Patient able to express need for assistance with ADLs?: No Does the patient have difficulty dressing or bathing?: Yes Independently performs ADLs?: No Communication: Independent Dressing (OT): Needs assistance Is this a change from baseline?: Pre-admission baseline Grooming: Needs assistance Is this a change from baseline?: Pre-admission baseline Bathing: Needs assistance Is this a change from baseline?: Pre-admission baseline Toileting: Needs assistance Is this a change from baseline?: Pre-admission baseline In/Out Bed: Needs assistance Is this a change from baseline?: Pre-admission baseline Walks in Home: Dependent Is this a change from baseline?: Pre-admission baseline Does the patient have difficulty walking or climbing stairs?: Yes Weakness of Legs: Left Weakness of Arms/Hands: Left  Permission Sought/Granted                  Emotional Assessment         Alcohol / Substance Use: Not Applicable Psych Involvement: No (comment)  Admission diagnosis:  Fall [W19.XXXA] AKI (acute kidney injury) ([place]) [N17.9] Injury of head, initial encounter [J09.32IZ] Fall, initial encounter [W19.XXXA] ARF (acute renal failure) ([place]) [N17.9] Patient Active Problem List   Diagnosis Date Noted  . ARF (acute renal failure) ([place]) 01/18/2021  .  Fall 01/17/2021  . Hypertension   . Hypothyroidism   . Stroke ([place])   . GERD (gastroesophageal reflux disease)   . Anemia in chronic kidney disease (CKD)   . Acute metabolic encephalopathy   . Acute renal failure superimposed on stage 3b chronic kidney  disease ([place])   . Type II diabetes mellitus with renal manifestations ([place])   . Abnormal mammogram of left breast 07/30/2018  . Closed displaced oblique fracture of shaft of left humerus 03/14/2018  . Osteopenia 01/20/2018  . Multiple thyroid nodules 07/13/2017  . Tracheal deviation 07/13/2017  . Malignant neoplasm of upper-outer quadrant of right breast in female, estrogen receptor positive ([place]) 01/20/2017  . Primary vulvar squamous cell carcinoma ([place]) 01/20/2017   PCP:  System, Provider Not In Pharmacy:   [place], [place] - [address] [place] [address] Phone: [phone] Fax: [phone]     Social Determinants of Health (SDOH) Interventions    Readmission Risk Interventions No flowsheet data found.

## 2021-01-21 DIAGNOSIS — N171 Acute kidney failure with acute cortical necrosis: Secondary | ICD-10-CM

## 2021-01-21 DIAGNOSIS — N182 Chronic kidney disease, stage 2 (mild): Secondary | ICD-10-CM

## 2021-01-21 LAB — COMPREHENSIVE METABOLIC PANEL
ALT: 10 U/L (ref 0–44)
AST: 17 U/L (ref 15–41)
Albumin: 2.5 g/dL — ABNORMAL LOW (ref 3.5–5.0)
Alkaline Phosphatase: 53 U/L (ref 38–126)
Anion gap: 7 (ref 5–15)
BUN: 32 mg/dL — ABNORMAL HIGH (ref 8–23)
CO2: 24 mmol/L (ref 22–32)
Calcium: 7.7 mg/dL — ABNORMAL LOW (ref 8.9–10.3)
Chloride: 108 mmol/L (ref 98–111)
Creatinine, Ser: 3.99 mg/dL — ABNORMAL HIGH (ref 0.44–1.00)
GFR, Estimated: 11 mL/min — ABNORMAL LOW (ref >60)
Glucose, Bld: 126 mg/dL — ABNORMAL HIGH (ref 70–99)
Potassium: 4.3 mmol/L (ref 3.5–5.1)
Sodium: 139 mmol/L (ref 135–145)
Total Bilirubin: 0.6 mg/dL (ref 0.3–1.2)
Total Protein: 6.1 g/dL — ABNORMAL LOW (ref 6.5–8.1)

## 2021-01-21 LAB — CBC
HCT: 25.6 % — ABNORMAL LOW (ref 36.0–46.0)
Hemoglobin: 8.3 g/dL — ABNORMAL LOW (ref 12.0–15.0)
MCH: 29.5 pg (ref 26.0–34.0)
MCHC: 32.4 g/dL (ref 30.0–36.0)
MCV: 91.1 fL (ref 80.0–100.0)
Platelets: 220 10*3/uL (ref 150–400)
RBC: 2.81 MIL/uL — ABNORMAL LOW (ref 3.87–5.11)
RDW: 14.3 % (ref 11.5–15.5)
WBC: 9.8 10*3/uL (ref 4.0–10.5)
nRBC: 0 % (ref 0.0–0.2)

## 2021-01-21 LAB — GLUCOSE, CAPILLARY
Glucose-Capillary: 100 mg/dL — ABNORMAL HIGH (ref 70–99)
Glucose-Capillary: 96 mg/dL (ref 70–99)

## 2021-01-21 MED ORDER — CLONIDINE HCL 0.2 MG/24HR TD PTWK
0.2000 mg | MEDICATED_PATCH | Freq: Once | TRANSDERMAL | Status: DC
  Administered 2021-01-21: 0.2 mg via TRANSDERMAL
  Filled 2021-01-21: qty 1

## 2021-01-21 MED ORDER — LEVETIRACETAM 500 MG PO TABS: 500.0000 mg | ORAL_TABLET | ORAL | 0 refills | Status: DC

## 2021-01-21 NOTE — Discharge Instructions (Signed)
Seizure, Adult A seizure is a sudden burst of abnormal electrical and chemical activity in the brain. Seizures usually last from 30 seconds to 2 minutes.  What are the causes? Common causes of this condition include:  Fever or infection.  Problems that affect the brain. These may include: ? A brain or head injury. ? Bleeding in the brain. ? A brain tumor.  Low levels of blood sugar or salt.  Kidney problems or liver problems.  Conditions that are passed from parent to child (are inherited).  Problems with a substance, such as: ? Having a reaction to a drug or a medicine. ? Stopping the use of a substance all of a sudden (withdrawal).  A stroke.  Disorders that affect how you develop. Sometimes, the cause may not be known.  What increases the risk?  Having someone in your family who has epilepsy. In this condition, seizures happen again and again over time. They have no clear cause.  Having had a tonic-clonic seizure before. This type of seizure causes you to: ? Tighten the muscles of the whole body. ? Lose consciousness.  Having had a head injury or strokes before.  Having had a lack of oxygen at birth. What are the signs or symptoms? There are many types of seizures. The symptoms vary depending on the type of seizure you have. Symptoms during a seizure  Shaking that you cannot control (convulsions) with fast, jerky movements of muscles.  Stiffness of the body.  Breathing problems.  Feeling mixed up (confused).  Staring or not responding to sound or touch.  Head nodding.  Eyes that blink, flutter, or move fast.  Drooling, grunting, or making clicking sounds with your mouth  Losing control of when you pee or poop. Symptoms before a seizure  Feeling afraid, nervous, or worried.  Feeling like you may vomit.  Feeling like: ? You are moving when you are not. ? Things around you are moving when they are not.  Feeling like you saw or heard something before  (dj vu).  Odd tastes or smells.  Changes in how you see. You may see flashing lights or spots. Symptoms after a seizure  Feeling confused.  Feeling sleepy.  Headache.  Sore muscles. How is this treated? If your seizure stops on its own, you will not need treatment. If your seizure lasts longer than 5 minutes, you will normally need treatment. Treatment may include:  Medicines given through an IV tube.  Avoiding things, such as medicines, that are known to cause your seizures.  Medicines to prevent seizures.  A device to prevent or control seizures.  Surgery.  A diet low in carbohydrates and high in fat (ketogenic diet). Follow these instructions at home: Medicines  Take over-the-counter and prescription medicines only as told by your doctor.  Avoid foods or drinks that may keep your medicine from working, such as alcohol. Activity  Follow instructions about driving, swimming, or doing things that would be dangerous if you had another seizure. Wait until your doctor says it is safe for you to do these things.  If you live in the [place] ask your local department of motor vehicles when you can drive.  Get a lot of rest. Teaching others  Teach friends and family what to do when you have a seizure. They should: ? Help you get down to the ground. ? Protect your head and body. ? Loosen any clothing around your neck. ? Turn you on your side. ? Know whether or not   you need emergency care. ? Stay with you until you are better.  Also, tell them what not to do if you have a seizure. Tell them: ? They should not hold you down. ? They should not put anything in your mouth.   General instructions  Avoid anything that gives you seizures.  Keep a seizure diary. Write down: ? What you remember about each seizure. ? What you think caused each seizure.  Keep all follow-up visits. Contact a doctor if:  You have another seizure or seizures. Call the doctor each time you  have a seizure.  The pattern of your seizures changes.  You keep having seizures with treatment.  You have symptoms of being sick or having an infection.  You are not able to take your medicine. Get help right away if:  You have any of these problems: ? A seizure that lasts longer than 5 minutes. ? Many seizures in a row and you do not feel better between seizures. ? A seizure that makes it harder to breathe. ? A seizure and you can no longer speak or use part of your body.  You do not wake up right after a seizure.  You get hurt during a seizure.  You feel confused or have pain right after a seizure. These symptoms may be an emergency. Get help right away. Call your local emergency services (911 in the [place]  Do not wait to see if the symptoms will go away.  Do not drive yourself to the hospital. Summary  A seizure is a sudden burst of abnormal electrical and chemical activity in the brain. Seizures normally last from 30 seconds to 2 minutes.  Causes of seizures include illness, injury to the head, low levels of blood sugar or salt, and certain conditions.  Most seizures will stop on their own in less than 5 minutes. Seizures that last longer than 5 minutes are a medical emergency and need treatment right away.  Many medicines are used to treat seizures. Take over-the-counter and prescription medicines only as told by your doctor. This information is not intended to replace advice given to you by your health care provider. Make sure you discuss any questions you have with your health care provider. Document Revised: 05/30/2020 Document Reviewed: 05/30/2020 Elsevier Patient Education  2021 Elsevier Inc.  

## 2021-01-21 NOTE — Progress Notes (Signed)
[facility] Liaison note: New referral for [facility] community based Palliative program to follow post discharge recieved from [facility]. Patient to discharge today to her son's home: [place]. [place]. Patient information given to referral. Thank you. Flo Shanks BSN, RN, [facility] [employer] [phone]

## 2021-01-21 NOTE — Progress Notes (Signed)
[name]  ROUNDING NOTE   Subjective:   Patient is seen laying in bed She is alert and able to answer questions  Denies nausea and vomiting Denies shortness of breath and chest pain  UOP-544ml (Purewick) LR @75  ml/hr Started keppra yesterday  Objective:  Vital signs in last 24 hours:  Temp:  [97.7 F (36.5 C)-98.4 F (36.9 C)] 97.7 F (36.5 C) (02/16 1119) Pulse Rate:  [91-101] 98 (02/16 1119) Resp:  [16-20] 16 (02/16 1119) BP: (136-155)/(76-95) 155/95 (02/16 1119) SpO2:  [99 %-100 %] 100 % (02/16 1119)  Weight change:  Filed Weights   01/17/21 1136 01/17/21 1143  Weight: 72.6 kg 72 kg    Intake/Output: I/O last 3 completed shifts: In: 1358.6 [P.O.:240; I.V.:1118.6] Out: 2250 [Urine:2250]   Intake/Output this shift:  Total I/O In: 220 [P.O.:220] Out: -   Physical Exam: General: NAD, sitting in chair  Head:  Swelling around left eye. Moist oral mucosal membranes  Eyes: Anicteric  Neck: Supple, trachea midline  Lungs:  Clear to auscultation  Heart: Regular rate and rhythm  Abdomen:  Soft, nontender   Extremities:  No peripheral edema.  Neurologic: Alert, able to answer questions  Skin: No lesions       Basic Metabolic Panel: Recent Labs  Lab 01/17/21 1202 01/18/21 0451 01/19/21 0602 01/20/21 0454 01/21/21 0555  NA 142 142  142 142 142 139  K 4.0 4.0  4.0 4.1 3.5 4.3  CL 108 110  109 110 111 108  CO2 19* 20*  20* 21* 21* 24  GLUCOSE 108* 120*  118* 98 77 126*  BUN 38* 39*  39* 34* 32* 32*  CREATININE 4.72* 4.37*  4.38* 4.33* 4.10* 3.99*  CALCIUM 8.7* 8.4*  8.5* 8.2* 8.2* 7.7*  PHOS  --  4.5  --   --   --     Liver Function Tests: Recent Labs  Lab 01/17/21 1202 01/18/21 0451 01/19/21 0602 01/20/21 0454 01/21/21 0555  AST 19 20 15 20 17   ALT 10 12 7 10 10   ALKPHOS 70 65 53 55 53  BILITOT 0.6 0.7 0.5 0.9 0.6  PROT 7.9 7.7 6.3* 6.4* 6.1*  ALBUMIN 3.2* 3.2*  3.1* 2.5* 2.7* 2.5*   No results for input(s):  LIPASE, AMYLASE in the last 168 hours. No results for input(s): AMMONIA in the last 168 hours.  CBC: Recent Labs  Lab 01/17/21 1202 01/18/21 0451 01/21/21 0555  WBC 10.3 11.1* 9.8  NEUTROABS 7.7 9.5*  --   HGB 9.3* 9.0* 8.3*  HCT 29.3* 27.9* 25.6*  MCV 91.3 91.2 91.1  PLT 290 246 220    Cardiac Enzymes: Recent Labs  Lab 01/18/21 0451  CKTOTAL 204    BNP: Invalid input(s): POCBNP  CBG: Recent Labs  Lab 01/20/21 1148 01/20/21 1659 01/20/21 2325 01/21/21 0745 01/21/21 1132  GLUCAP 129* 169* 139* 100* 68    Microbiology: Results for orders placed or performed during the hospital encounter of 01/17/21  SARS CORONAVIRUS 2 (TAT 6-24 HRS) Nasopharyngeal Nasopharyngeal Swab     Status: None   Collection Time: 01/17/21  2:24 PM   Specimen: Nasopharyngeal Swab  Result Value Ref Range Status   SARS Coronavirus 2 NEGATIVE NEGATIVE Final    Comment: (NOTE) SARS-CoV-2 target nucleic acids are NOT DETECTED.  The SARS-CoV-2 RNA is generally detectable in upper and lower respiratory specimens during the acute phase of infection. Negative results do not preclude SARS-CoV-2 infection, do not rule out co-infections with other pathogens, and  should not be used as the sole basis for treatment or other patient management decisions. Negative results must be combined with clinical observations, patient history, and epidemiological information. The expected result is Negative.  Fact Sheet for Patients: SugarRoll.be  Fact Sheet for Healthcare Providers: https://www.woods-mathews.com/  This test is not yet approved or cleared by the [place] FDA and  has been authorized for detection and/or diagnosis of SARS-CoV-2 by FDA under an Emergency Use Authorization (EUA). This EUA will remain  in effect (meaning this test can be used) for the duration of the COVID-19 declaration under Se ction 564(b)(1) of the Act, 21 U.S.C. section  360bbb-3(b)(1), unless the authorization is terminated or revoked sooner.  Performed at [facility], [place] [address] [place], [place] [address]     Coagulation Studies: No results for input(s): LABPROT, INR in the last 72 hours.  Urinalysis: No results for input(s): COLORURINE, LABSPEC, PHURINE, GLUCOSEU, HGBUR, BILIRUBINUR, KETONESUR, PROTEINUR, UROBILINOGEN, NITRITE, LEUKOCYTESUR in the last 72 hours.  Invalid input(s): APPERANCEUR    Imaging: EEG  Result Date: 01/19/2021 Lora Havens, MD     01/19/2021  5:05 PM Patient Name: Kirsten Mcmahon MRN: [id number] Epilepsy Attending: Lora Havens Referring Physician/Provider: Dr Marylu Lund Date: 01/19/2021 Duration: 29.08 mins Patient history: 75yo F with ams. EEG to evaluate for seizure Level of alertness: Awake AEDs during EEG study: None Technical aspects: This EEG study was done with scalp electrodes positioned according to the 10-20 International system of electrode placement. Electrical activity was acquired at a sampling rate of 500Hz  and reviewed with a high frequency filter of 70Hz  and a low frequency filter of 1Hz . EEG data were recorded continuously and digitally stored. Description: The posterior dominant rhythm consists of 8 Hz activity of moderate voltage (25-35 uV) seen predominantly in posterior head regions, symmetric and reactive to eye opening and eye closing. EEG showed sharply contoured 6-7hz  theta slowing in right frontal region consistent with breech artifact. Single bilateral frontotemporal spike was also noted. Physiologic photic driving was not seen during photic stimulation.  Hyperventilation was not performed.   ABNORMALITY - Spike, bilateral frontotemporal - Continuous slow,  right frontal region - Breech aritfact, right frontal region IMPRESSION: This study is showed evidence of potential epileptogenicity arising from bilateral frontotemporal region, There is also cortical dysfunction due to underlying  stroke and craniotomy in right frontal region. No seizures were seen throughout the recording. [name]     Medications:   . lactated ringers 75 mL/hr at 01/20/21 0811   . amLODipine  7.5 mg Oral Daily  . aspirin EC  81 mg Oral Daily  . cholecalciferol  1,000 Units Oral Daily  . enoxaparin (LOVENOX) injection  1 mg/kg Subcutaneous Q24H  . insulin aspart  0-5 Units Subcutaneous QHS  . insulin aspart  0-9 Units Subcutaneous TID WC  . latanoprost  1 drop Both Eyes QHS  . letrozole  2.5 mg Oral Daily  . levETIRAcetam  500 mg Oral Q24H  . levothyroxine  112 mcg Oral Q0600  . LORazepam  1 mg Intramuscular Once  . metoprolol succinate  12.5 mg Oral Daily   acetaminophen **OR** acetaminophen, hydrALAZINE, loperamide, meclizine  Assessment/ Plan:  Kirsten Mcmahon is a 75 y.o. black female with hypertension, CVA with residual left sided weakness, diabetes mellitus type II, hypothyroidism, hyperlipidemia, glaucoma, depression, seizure disorder and history of breast cancer who was admitted to [facility] on 01/17/2021 for Fall [W19.XXXA]  1. Chronic kidney disease stage  V:  baseline creatinine of 3.97, GFR of 10 on 10/16/2020.  Followed by [facility], Dr. Radene Knee.  Continued creatinine improvement. Crt-3.99-baseline -Continue to hold losartan No acute need for dialysis at this time. Will continue to monitor labs  2. Metabolic acidosis    3. Anemia of chronic kidney disease:  Hgb 9.0.  Will monitor this result  4. Diabetes Mellitus Type 2 with renal manifestations -HBG A1c-5.7 -Stable glucose levels     LOS: 3 Colon Flattery, NP 2/16/202212:48 PM

## 2021-01-21 NOTE — TOC Progression Note (Signed)
Transition of Care [facility]) - Progression Note    Patient Details  Name: Kirsten Mcmahon MRN: [id number] Date of Birth: 1946/09/05  [facility]) CM/SW [place], RN Phone Number: 01/21/2021, 1:27 PM  Clinical Narrative:   RNCM has communicated with patient, daughter Georgina Quint, son [name] and nurse from [employer]. After much back and forth it is decided that patient will go home with son [name] to [place]. [place] Patient will arrange transport for around 3pm today. Bedside nurse and MD made aware. Patient will leave with orders for home health as well as Palliative Care consult, Kieth Brightly and Santiago Glad with [facility] were notified. Claudia Desanctis will arrange home health.     Expected Discharge Plan: [place] Barriers to Discharge: Continued Medical Work up  Expected Discharge Plan and Services Expected Discharge Plan: [place] arrangements for the past 2 months: Mobile Home Expected Discharge Date: 01/21/21                                     Social Determinants of Health (SDOH) Interventions    Readmission Risk Interventions No flowsheet data found.

## 2021-01-21 NOTE — Care Management Important Message (Signed)
Important Message  Patient Details  Name: KENZLEIGH SEDAM MRN: [id number] Date of Birth: 1946-10-17   Medicare Important Message Given:  N/A - LOS <3 / Initial given by admissions     [name] A Jayli Fogleman 01/21/2021, 12:58 PM

## 2021-01-23 NOTE — Discharge Summary (Addendum)
[place] at [place] NAME: Kirsten Mcmahon    MR#:  [id number]  DATE OF BIRTH:  1946-05-04  DATE OF ADMISSION:  01/17/2021   ADMITTING PHYSICIAN: Donne Hazel, MD  DATE OF DISCHARGE: 01/21/2021  4:08 PM  PRIMARY CARE PHYSICIAN: [place]   ADMISSION DIAGNOSIS:  Fall [W19.XXXA] AKI (acute kidney injury) ([place]) [N17.9] Injury of head, initial encounter [H82.99BZ] Fall, initial encounter [W19.XXXA] ARF (acute renal failure) (HCC) [N17.9] DISCHARGE DIAGNOSIS:  Principal Problem:   Fall Active Problems:   Malignant neoplasm of upper-outer quadrant of right breast in female, estrogen receptor positive ([place])   Hypertension   Hypothyroidism   Stroke ([place])   Anemia in chronic kidney disease (CKD)   Acute metabolic encephalopathy   Acute renal failure superimposed on stage 3b chronic kidney disease (HCC)   Type II diabetes mellitus with renal manifestations (HCC)   ARF (acute renal failure) ([place])  SECONDARY DIAGNOSIS:   Past Medical History:  Diagnosis Date  . Cancer [facility]) 2017   Right breast  . Depression   . GERD (gastroesophageal reflux disease)   . Glaucoma   . Hemiparesis ([place])    left side  . High cholesterol   . History of seizure    x 1 - after a spider bite  . History of stroke with residual deficit    left-side weakness  . Hypertension    states BP under control with meds., has been on med. x 2 yr.  . Hypothyroidism   . Non-insulin dependent type 2 diabetes mellitus ([place])   . Overactive bladder   . Stroke [facility])    1998 weakness on left side   HOSPITAL COURSE:  75 y.o.femalewith medical history significant ofhypertension, hyperlipidemia, diabetes mellitus, stroke with left-sided weakness, wheelchair-bound, GERD, hypothyroidism, depression, breast cancer admitted s/p fall.  s/pFall and acute metabolic encephalopathy:Likely multifactorial etiology, including worsening renal functionandpain from eyeinjury.  CT of head, CT of C-spine, CT of maxillary imagedid not showacute intracranial abnormalities or bony fracture.  -MRI brain - Neg for acute infarct -PT/OT recommendation for SNF but patient and family decided to take her home  Malignant neoplasm of upper-outer quadrant of right breast in female, estrogen receptor positive ([place]) -Continue letrozole as pt tolerates  Hypertension -BP overall stable at this time  Prior hx of Stroke [facility]) -continue on aspirin  Hypothyroidism: -continue with synthroid -TSH is within normal limits  Anemia in chronic kidney disease (CKD): -stable  Acute superimposed on chronic kidney disease V ([place]): baseline creatinine of 3.97, GFR of 10 on 10/16/2020.  Followed by [facility]phrology, Dr. Radene Knee.  Continued creatinine improvement. Crt-3.99 at DC-baseline   Metabolic acidosis  due to renal dz  Type II diabetes mellitus with renal manifestations ([place]):  -Hgb A1c noted to be 5.7  New seizures -EEG demonstrated bilateral frontotemporal spikes.  - Neuro seen -> considering EEG findings along with known large R insular/frontal stroke, they started her on Keppra at 500mg  daily. - Follow up with Neurology outpatient. - No driving until free of any seizure events for 6 months   DISCHARGE CONDITIONS:  stable CONSULTS OBTAINED:   DRUG ALLERGIES:   Allergies  Allergen Reactions  . Levemir [Insulin Detemir] Itching   DISCHARGE MEDICATIONS:   Allergies as of 01/21/2021      Reactions   Levemir [insulin Detemir] Itching      Medication List    STOP taking these medications   losartan 100 MG tablet Commonly known as: COZAAR  PROBIOTIC DAILY PO     TAKE these medications   alendronate 70 MG tablet Commonly known as: FOSAMAX Take 1 tablet (70 mg total) by mouth once a week. Take with a full glass of water on an empty stomach.   amLODipine 5 MG tablet Commonly known as: NORVASC Take 1 tablet (5 mg total) by mouth daily. What  changed: additional instructions   amLODipine 2.5 MG tablet Commonly known as: NORVASC Take 2.5 mg by mouth daily. Take one tablet (2.5 mg) by mouth along with one (5 mg) tablet for total 7.5 mg once daily What changed: Another medication with the same name was changed. Make sure you understand how and when to take each.   aspirin EC 81 MG tablet Take 81 mg by mouth daily.   cholecalciferol 1000 units tablet Commonly known as: VITAMIN D Take 1,000 Units by mouth daily.   cloNIDine 0.2 mg/24hr patch Commonly known as: CATAPRES - Dosed in mg/24 hr Place 0.2 mg onto the skin once a week. Wednesday   latanoprost 0.005 % ophthalmic solution Commonly known as: XALATAN Place 1 drop into both eyes at bedtime.   letrozole 2.5 MG tablet Commonly known as: FEMARA Take 1 tablet (2.5 mg total) by mouth daily.   levETIRAcetam 500 MG tablet Commonly known as: KEPPRA Take 1 tablet (500 mg total) by mouth daily.   levothyroxine 112 MCG tablet Commonly known as: SYNTHROID Take 1 tablet (112 mcg total) by mouth daily before breakfast.   loperamide 2 MG capsule Commonly known as: IMODIUM Take 2 mg by mouth 2 (two) times daily as needed for diarrhea or loose stools.   meclizine 25 MG tablet Commonly known as: ANTIVERT Take 25 mg by mouth daily as needed for dizziness.   metoprolol succinate 25 MG 24 hr tablet Commonly known as: TOPROL-XL Take 12.5 mg by mouth daily.   mirabegron ER 25 MG Tb24 tablet Commonly known as: Myrbetriq Take 1 tablet (25 mg total) by mouth daily. Notes to patient: Not given this hospital visit   Tylenol 325 MG tablet Generic drug: acetaminophen Take 650 mg by mouth daily as needed for pain.      DISCHARGE INSTRUCTIONS:   DIET:  Renal diet DISCHARGE CONDITION:  Stable ACTIVITY:  Activity as tolerated OXYGEN:  Home Oxygen: No.  Oxygen Delivery: room air DISCHARGE LOCATION:  Home with HHPT, OT & Palliative care to follow   If you experience  worsening of your admission symptoms, develop shortness of breath, life threatening emergency, suicidal or homicidal thoughts you must seek medical attention immediately by calling 911 or calling your MD immediately  if symptoms less severe.  You Must read complete instructions/literature along with all the possible adverse reactions/side effects for all the Medicines you take and that have been prescribed to you. Take any new Medicines after you have completely understood and accpet all the possible adverse reactions/side effects.   Please note  You were cared for by a hospitalist during your hospital stay. If you have any questions about your discharge medications or the care you received while you were in the hospital after you are discharged, you can call the unit and asked to speak with the hospitalist on call if the hospitalist that took care of you is not available. Once you are discharged, your primary care physician will handle any further medical issues. Please note that NO REFILLS for any discharge medications will be authorized once you are discharged, as it is imperative that you return to your  primary care physician (or establish a relationship with a primary care physician if you do not have one) for your aftercare needs so that they can reassess your need for medications and monitor your lab values.    On the day of Discharge:  VITAL SIGNS:  Blood pressure (!) 148/85, pulse 96, temperature 97.6 F (36.4 C), temperature source Oral, resp. rate 16, height 5\' 2"  (1.575 m), weight 72 kg, SpO2 100 %. PHYSICAL EXAMINATION:  GENERAL:  75 y.o.-year-old patient lying in the bed with no acute distress.  EYES: Pupils equal, round, reactive to light and accommodation. No scleral icterus. Extraocular muscles intact.  HEENT: Head atraumatic, normocephalic. Oropharynx and nasopharynx clear.  NECK:  Supple, no jugular venous distention. No thyroid enlargement, no tenderness.  LUNGS: Normal breath  sounds bilaterally, no wheezing, rales,rhonchi or crepitation. No use of accessory muscles of respiration.  CARDIOVASCULAR: S1, S2 normal. No murmurs, rubs, or gallops.  ABDOMEN: Soft, non-tender, non-distended. Bowel sounds present. No organomegaly or mass.  EXTREMITIES: No pedal edema, cyanosis, or clubbing.  NEUROLOGIC: Cranial nerves II through XII are intact. Muscle strength 5/5 in all extremities. Sensation intact. Gait not checked.  PSYCHIATRIC: The patient is alert and oriented x 3.  SKIN: No obvious rash, lesion, or ulcer.  DATA REVIEW:   CBC Recent Labs  Lab 01/21/21 0555  WBC 9.8  HGB 8.3*  HCT 25.6*  PLT 220    Chemistries  Recent Labs  Lab 01/21/21 0555  NA 139  K 4.3  CL 108  CO2 24  GLUCOSE 126*  BUN 32*  CREATININE 3.99*  CALCIUM 7.7*  AST 17  ALT 10  ALKPHOS 53  BILITOT 0.6     Outpatient follow-up  Follow-up Information    [place]. Schedule an appointment as soon as possible for a visit in 1 week(s).   Contact information: [address] [place] [address] [phone]        Clarice Pole, MD. Schedule an appointment as soon as possible for a visit in 1 week(s).   Specialty: Nephrology Contact information: [place]. [facility] [place] [address] [phone]        Vladimir Crofts, MD. Schedule an appointment as soon as possible for a visit in 2 week(s).   Specialty: Neurology Contact information: [place] [facility] [place] [address] [phone]               30 Day Unplanned Readmission Risk Score   Flowsheet Row ED to Hosp-Admission (Discharged) from 01/17/2021 in [place] (1A)  30 Day Unplanned Readmission Risk Score (%) 22.42 Filed at 01/21/2021 1600     This score is the patient's risk of an unplanned readmission within 30 days of being discharged (0 -100%). The score is based on dignosis,  age, lab data, medications, orders, and past utilization.   Low:  0-14.9   Medium: 15-21.9   High: 22-29.9   Extreme: 30 and above         Management plans discussed with the patient, family and they are in agreement.  CODE STATUS: Prior   TOTAL TIME TAKING CARE OF THIS PATIENT: 45 minutes.    Max Sane M.D on 01/23/2021 at 8:18 PM  [facility]   CC: Primary care physician; [place]   Note: This dictation was prepared with Dragon dictation along with smaller phrase technology. Any transcriptional errors that result from this process are unintentional.

## 2021-04-09 ENCOUNTER — Emergency Department: Payer: Medicare (Managed Care)

## 2021-04-09 ENCOUNTER — Other Ambulatory Visit: Payer: Self-pay

## 2021-04-09 ENCOUNTER — Inpatient Hospital Stay
Admission: EM | Admit: 2021-04-09 | Discharge: 2021-04-24 | DRG: 673 | Disposition: A | Discharge status: Home Health | Payer: Medicare (Managed Care) | Attending: Internal Medicine | Admitting: Internal Medicine

## 2021-04-09 DIAGNOSIS — E1122 Type 2 diabetes mellitus with diabetic chronic kidney disease: Secondary | ICD-10-CM | POA: Diagnosis present

## 2021-04-09 DIAGNOSIS — E1129 Type 2 diabetes mellitus with other diabetic kidney complication: Secondary | ICD-10-CM | POA: Diagnosis present

## 2021-04-09 DIAGNOSIS — Z79890 Hormone replacement therapy (postmenopausal): Secondary | ICD-10-CM

## 2021-04-09 DIAGNOSIS — Z79811 Long term (current) use of aromatase inhibitors: Secondary | ICD-10-CM | POA: Diagnosis not present

## 2021-04-09 DIAGNOSIS — R600 Localized edema: Secondary | ICD-10-CM | POA: Diagnosis present

## 2021-04-09 DIAGNOSIS — N179 Acute kidney failure, unspecified: Secondary | ICD-10-CM | POA: Diagnosis present

## 2021-04-09 DIAGNOSIS — R1310 Dysphagia, unspecified: Secondary | ICD-10-CM | POA: Diagnosis present

## 2021-04-09 DIAGNOSIS — R609 Edema, unspecified: Secondary | ICD-10-CM

## 2021-04-09 DIAGNOSIS — D72825 Bandemia: Secondary | ICD-10-CM | POA: Diagnosis not present

## 2021-04-09 DIAGNOSIS — I2510 Atherosclerotic heart disease of native coronary artery without angina pectoris: Secondary | ICD-10-CM | POA: Diagnosis present

## 2021-04-09 DIAGNOSIS — E876 Hypokalemia: Secondary | ICD-10-CM | POA: Diagnosis not present

## 2021-04-09 DIAGNOSIS — K2100 Gastro-esophageal reflux disease with esophagitis, without bleeding: Secondary | ICD-10-CM | POA: Diagnosis not present

## 2021-04-09 DIAGNOSIS — Z7983 Long term (current) use of bisphosphonates: Secondary | ICD-10-CM

## 2021-04-09 DIAGNOSIS — Z7982 Long term (current) use of aspirin: Secondary | ICD-10-CM

## 2021-04-09 DIAGNOSIS — D72829 Elevated white blood cell count, unspecified: Secondary | ICD-10-CM | POA: Diagnosis present

## 2021-04-09 DIAGNOSIS — N182 Chronic kidney disease, stage 2 (mild): Secondary | ICD-10-CM | POA: Diagnosis not present

## 2021-04-09 DIAGNOSIS — A409 Streptococcal sepsis, unspecified: Secondary | ICD-10-CM | POA: Diagnosis not present

## 2021-04-09 DIAGNOSIS — Z79899 Other long term (current) drug therapy: Secondary | ICD-10-CM

## 2021-04-09 DIAGNOSIS — N186 End stage renal disease: Secondary | ICD-10-CM

## 2021-04-09 DIAGNOSIS — N171 Acute kidney failure with acute cortical necrosis: Secondary | ICD-10-CM

## 2021-04-09 DIAGNOSIS — K219 Gastro-esophageal reflux disease without esophagitis: Secondary | ICD-10-CM | POA: Diagnosis present

## 2021-04-09 DIAGNOSIS — R7881 Bacteremia: Secondary | ICD-10-CM | POA: Diagnosis not present

## 2021-04-09 DIAGNOSIS — E8770 Fluid overload, unspecified: Secondary | ICD-10-CM | POA: Diagnosis present

## 2021-04-09 DIAGNOSIS — D631 Anemia in chronic kidney disease: Secondary | ICD-10-CM | POA: Diagnosis not present

## 2021-04-09 DIAGNOSIS — Z9181 History of falling: Secondary | ICD-10-CM

## 2021-04-09 DIAGNOSIS — R509 Fever, unspecified: Secondary | ICD-10-CM | POA: Diagnosis not present

## 2021-04-09 DIAGNOSIS — Z90710 Acquired absence of both cervix and uterus: Secondary | ICD-10-CM | POA: Diagnosis not present

## 2021-04-09 DIAGNOSIS — B955 Unspecified streptococcus as the cause of diseases classified elsewhere: Secondary | ICD-10-CM | POA: Diagnosis not present

## 2021-04-09 DIAGNOSIS — T426X6A Underdosing of other antiepileptic and sedative-hypnotic drugs, initial encounter: Secondary | ICD-10-CM | POA: Diagnosis not present

## 2021-04-09 DIAGNOSIS — N189 Chronic kidney disease, unspecified: Secondary | ICD-10-CM | POA: Diagnosis present

## 2021-04-09 DIAGNOSIS — Z20822 Contact with and (suspected) exposure to covid-19: Secondary | ICD-10-CM | POA: Diagnosis present

## 2021-04-09 DIAGNOSIS — Z23 Encounter for immunization: Secondary | ICD-10-CM | POA: Diagnosis present

## 2021-04-09 DIAGNOSIS — Z5329 Procedure and treatment not carried out because of patient's decision for other reasons: Secondary | ICD-10-CM | POA: Diagnosis not present

## 2021-04-09 DIAGNOSIS — Z888 Allergy status to other drugs, medicaments and biological substances status: Secondary | ICD-10-CM

## 2021-04-09 DIAGNOSIS — G40909 Epilepsy, unspecified, not intractable, without status epilepticus: Secondary | ICD-10-CM | POA: Diagnosis present

## 2021-04-09 DIAGNOSIS — E7800 Pure hypercholesterolemia, unspecified: Secondary | ICD-10-CM | POA: Diagnosis present

## 2021-04-09 DIAGNOSIS — N185 Chronic kidney disease, stage 5: Secondary | ICD-10-CM | POA: Diagnosis not present

## 2021-04-09 DIAGNOSIS — E872 Acidosis: Secondary | ICD-10-CM | POA: Diagnosis present

## 2021-04-09 DIAGNOSIS — I10 Essential (primary) hypertension: Secondary | ICD-10-CM | POA: Diagnosis present

## 2021-04-09 DIAGNOSIS — C50911 Malignant neoplasm of unspecified site of right female breast: Secondary | ICD-10-CM | POA: Diagnosis present

## 2021-04-09 DIAGNOSIS — E1165 Type 2 diabetes mellitus with hyperglycemia: Secondary | ICD-10-CM | POA: Diagnosis not present

## 2021-04-09 DIAGNOSIS — E039 Hypothyroidism, unspecified: Secondary | ICD-10-CM | POA: Diagnosis present

## 2021-04-09 DIAGNOSIS — Y92239 Unspecified place in hospital as the place of occurrence of the external cause: Secondary | ICD-10-CM | POA: Diagnosis not present

## 2021-04-09 DIAGNOSIS — N184 Chronic kidney disease, stage 4 (severe): Secondary | ICD-10-CM | POA: Diagnosis not present

## 2021-04-09 DIAGNOSIS — R410 Disorientation, unspecified: Secondary | ICD-10-CM | POA: Diagnosis not present

## 2021-04-09 DIAGNOSIS — I120 Hypertensive chronic kidney disease with stage 5 chronic kidney disease or end stage renal disease: Secondary | ICD-10-CM | POA: Diagnosis present

## 2021-04-09 DIAGNOSIS — Z992 Dependence on renal dialysis: Secondary | ICD-10-CM | POA: Diagnosis not present

## 2021-04-09 DIAGNOSIS — E785 Hyperlipidemia, unspecified: Secondary | ICD-10-CM | POA: Diagnosis present

## 2021-04-09 DIAGNOSIS — G9341 Metabolic encephalopathy: Secondary | ICD-10-CM | POA: Diagnosis present

## 2021-04-09 DIAGNOSIS — I69354 Hemiplegia and hemiparesis following cerebral infarction affecting left non-dominant side: Secondary | ICD-10-CM | POA: Diagnosis not present

## 2021-04-09 DIAGNOSIS — R109 Unspecified abdominal pain: Secondary | ICD-10-CM | POA: Diagnosis not present

## 2021-04-09 DIAGNOSIS — M7989 Other specified soft tissue disorders: Secondary | ICD-10-CM

## 2021-04-09 DIAGNOSIS — Z91128 Patient's intentional underdosing of medication regimen for other reason: Secondary | ICD-10-CM

## 2021-04-09 DIAGNOSIS — Z91041 Radiographic dye allergy status: Secondary | ICD-10-CM

## 2021-04-09 DIAGNOSIS — N2581 Secondary hyperparathyroidism of renal origin: Secondary | ICD-10-CM | POA: Diagnosis present

## 2021-04-09 DIAGNOSIS — Z91040 Latex allergy status: Secondary | ICD-10-CM

## 2021-04-09 DIAGNOSIS — Z7984 Long term (current) use of oral hypoglycemic drugs: Secondary | ICD-10-CM

## 2021-04-09 DIAGNOSIS — N1832 Chronic kidney disease, stage 3b: Secondary | ICD-10-CM | POA: Diagnosis not present

## 2021-04-09 DIAGNOSIS — Z91013 Allergy to seafood: Secondary | ICD-10-CM

## 2021-04-09 LAB — BASIC METABOLIC PANEL
Anion gap: 12 (ref 5–15)
BUN: 79 mg/dL — ABNORMAL HIGH (ref 8–23)
CO2: 17 mmol/L — ABNORMAL LOW (ref 22–32)
Calcium: 7.3 mg/dL — ABNORMAL LOW (ref 8.9–10.3)
Chloride: 108 mmol/L (ref 98–111)
Creatinine, Ser: 7.99 mg/dL — ABNORMAL HIGH (ref 0.44–1.00)
GFR, Estimated: 5 mL/min — ABNORMAL LOW (ref >60)
Glucose, Bld: 111 mg/dL — ABNORMAL HIGH (ref 70–99)
Potassium: 4.9 mmol/L (ref 3.5–5.1)
Sodium: 137 mmol/L (ref 135–145)

## 2021-04-09 LAB — CBC
HCT: 24.8 % — ABNORMAL LOW (ref 36.0–46.0)
Hemoglobin: 8.1 g/dL — ABNORMAL LOW (ref 12.0–15.0)
MCH: 30.1 pg (ref 26.0–34.0)
MCHC: 32.7 g/dL (ref 30.0–36.0)
MCV: 92.2 fL (ref 80.0–100.0)
Platelets: 246 10*3/uL (ref 150–400)
RBC: 2.69 MIL/uL — ABNORMAL LOW (ref 3.87–5.11)
RDW: 14.6 % (ref 11.5–15.5)
WBC: 10.6 10*3/uL — ABNORMAL HIGH (ref 4.0–10.5)
nRBC: 0 % (ref 0.0–0.2)

## 2021-04-09 LAB — GLUCOSE, CAPILLARY: Glucose-Capillary: 81 mg/dL (ref 70–99)

## 2021-04-09 LAB — RESP PANEL BY RT-PCR (FLU A&B, COVID) ARPGX2
Influenza A by PCR: NEGATIVE
Influenza B by PCR: NEGATIVE
SARS Coronavirus 2 by RT PCR: NEGATIVE

## 2021-04-09 LAB — TROPONIN I (HIGH SENSITIVITY)
Troponin I (High Sensitivity): 8 ng/L (ref <18)
Troponin I (High Sensitivity): 8 ng/L (ref <18)

## 2021-04-09 LAB — BRAIN NATRIURETIC PEPTIDE: B Natriuretic Peptide: 376 pg/mL — ABNORMAL HIGH (ref 0.0–100.0)

## 2021-04-09 IMAGING — US US RENAL
1 series · 14 of 25 positions shown · non-contrast
Comparison: None.

CLINICAL DATA: Acute kidney injury.

EXAM:
RENAL / URINARY TRACT ULTRASOUND COMPLETE

[Series 1: us renal · 14 of 35 slices shown]
[im 1/35]
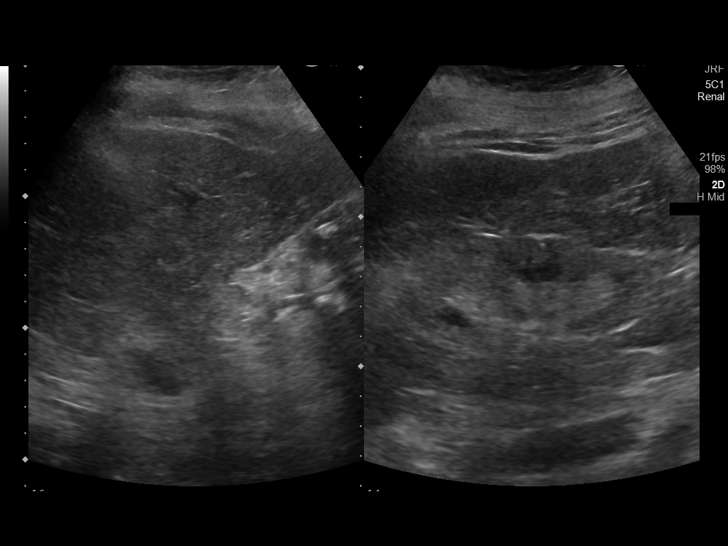
[im 3/35]
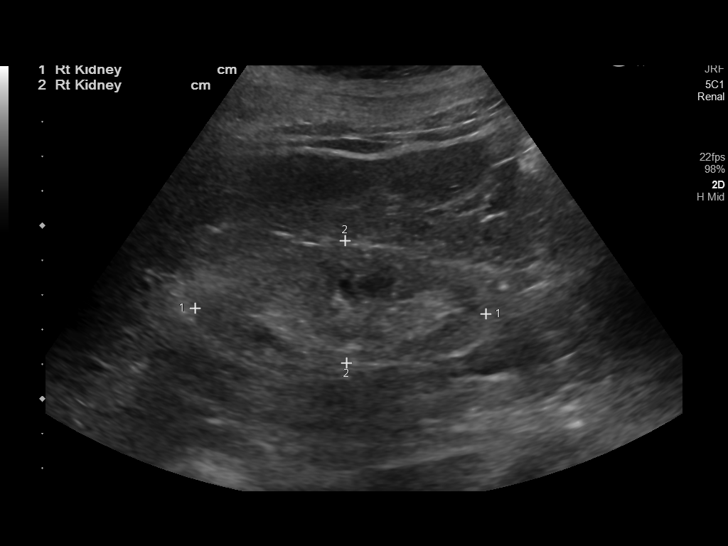
[im 6/35]
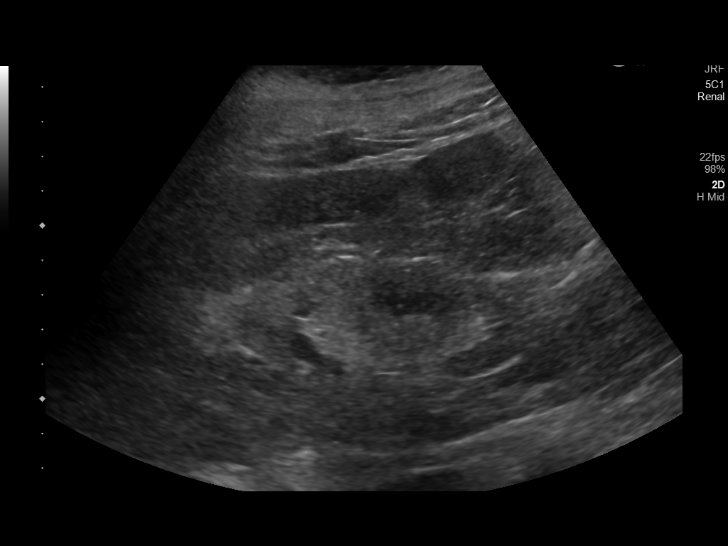
[im 9/35]
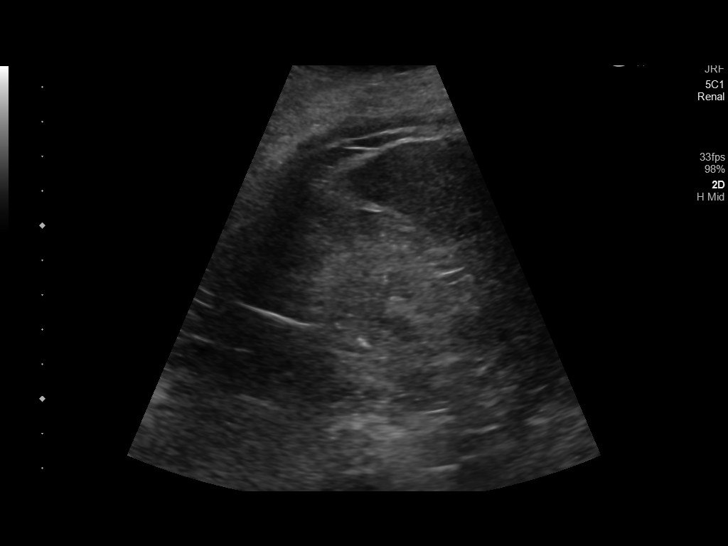
[im 12/35]
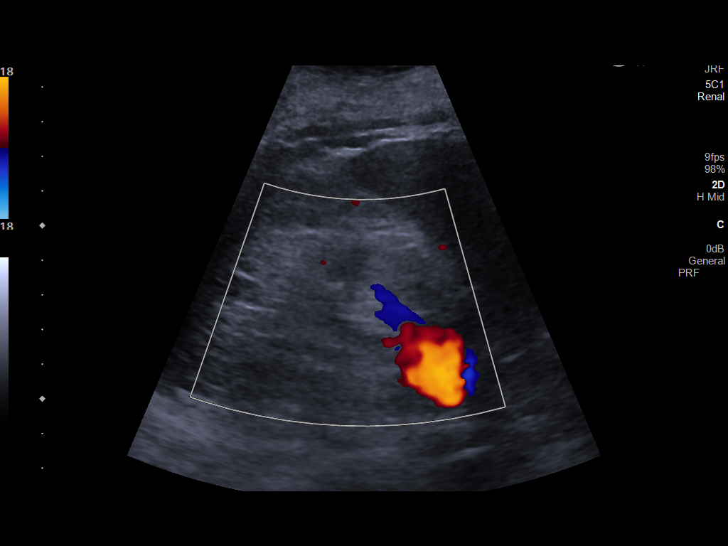
[im 13/35]
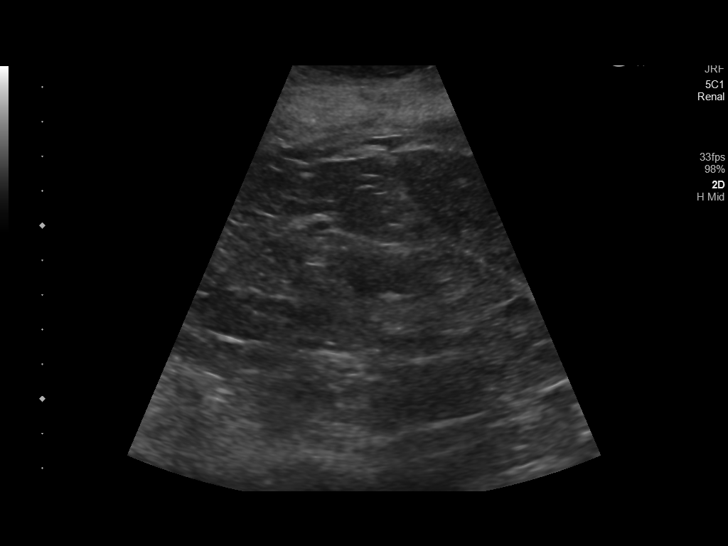
[im 16/35]
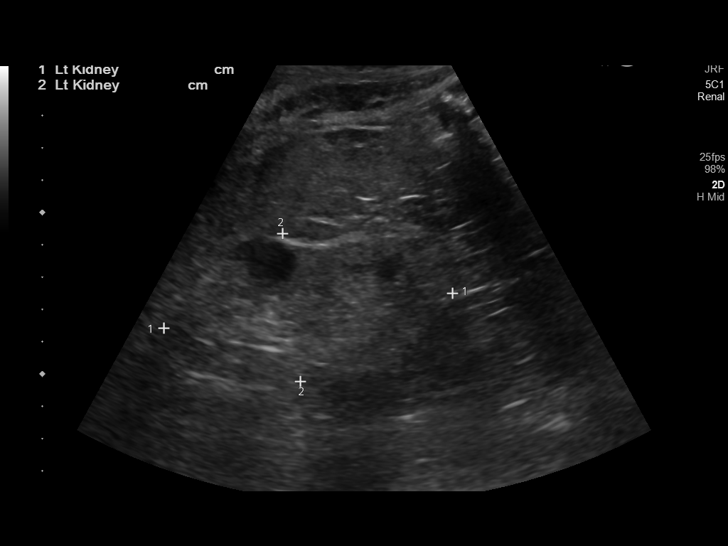
[im 19/35]
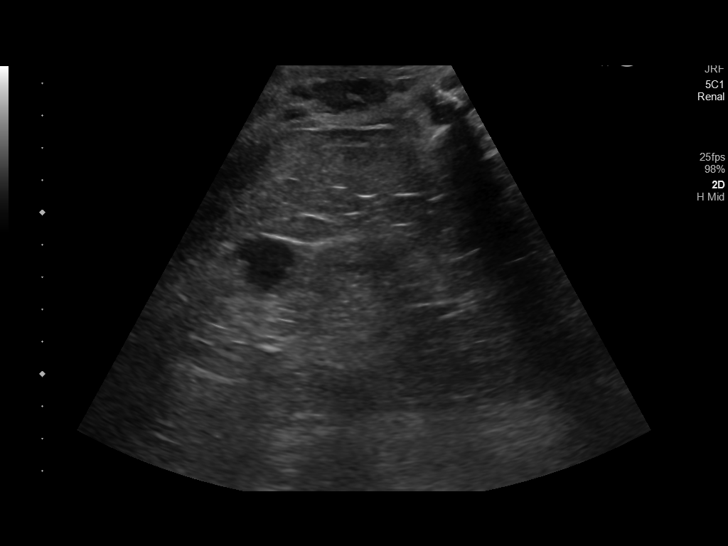
[im 22/35]
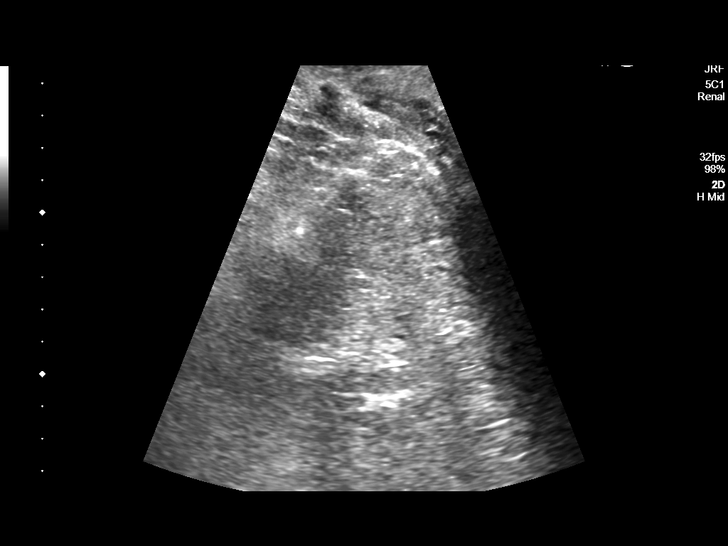
[im 23/35]
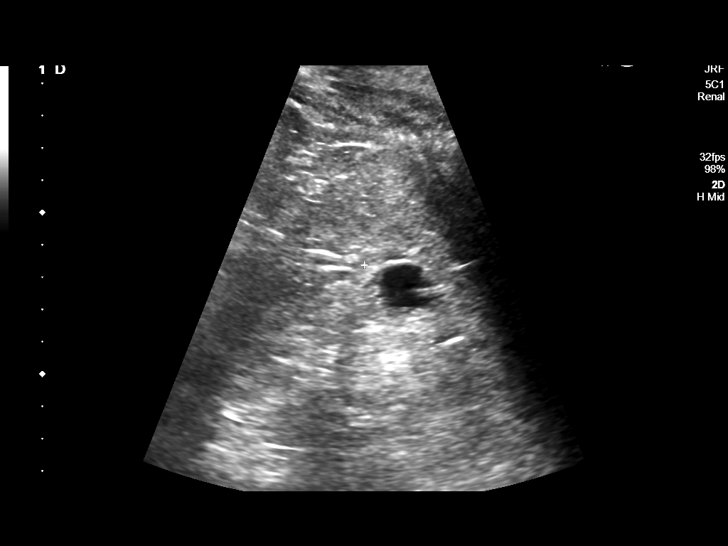
[im 26/35]
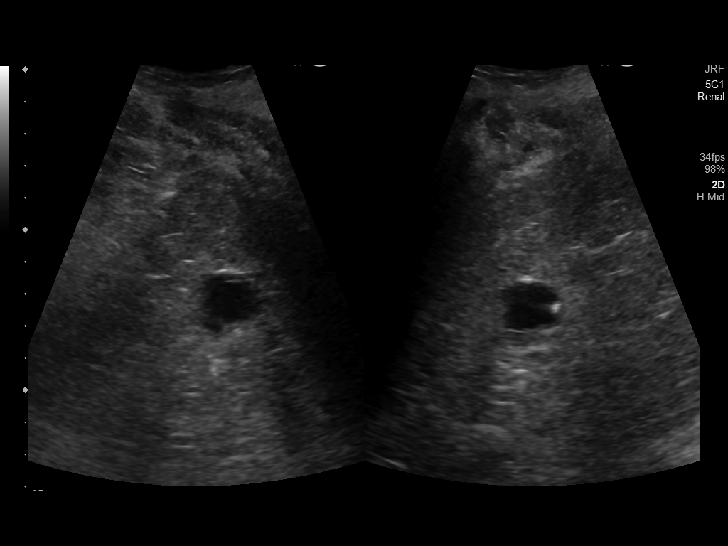
[im 29/35]
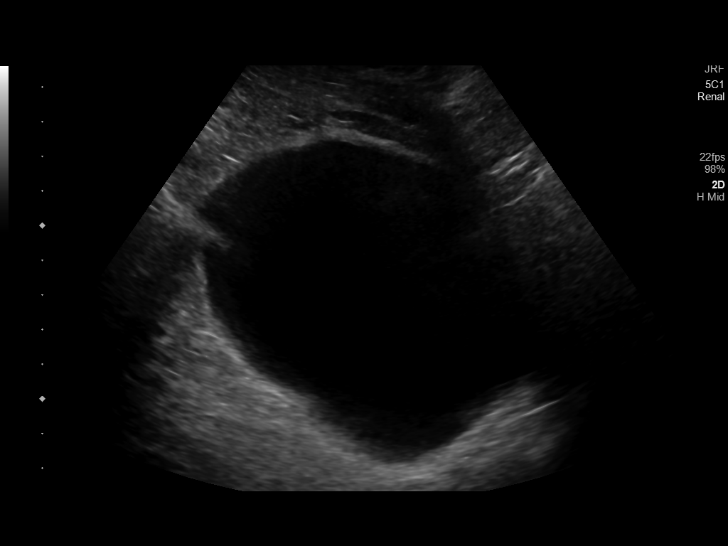
[im 32/35]
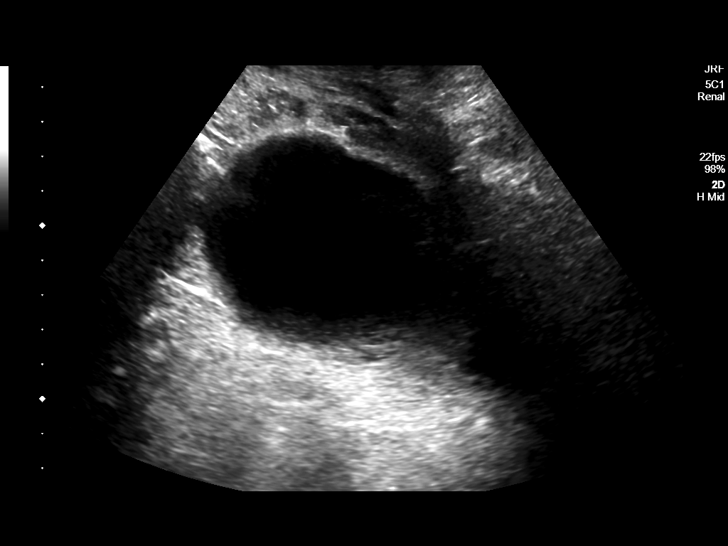
[im 35/35]
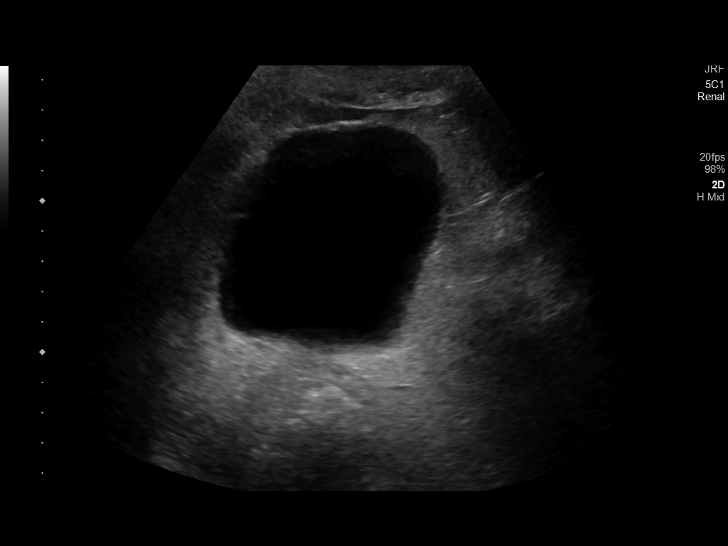

[14 of 25 positions shown; findings below may reference images not displayed]

FINDINGS: Right Kidney:

Renal measurements: 8.4 cm x 3.5 cm x 3.8 cm = volume: 59 mL.
Diffusely increased echogenicity of the renal parenchyma is noted.
No mass or hydronephrosis visualized.

Left Kidney:

Renal measurements: 9.0 cm x 4.6 cm x 3.8 cm = volume: 83 mL.
Diffusely increased echogenicity of the renal parenchyma is noted. A
2.0 cm x 2.1 cm x 1.8 cm anechoic structure is seen within the mid
left kidney. No abnormal flow is noted within this region on color
Doppler evaluation. No hydronephrosis is visualized.

Bladder:

Appears normal for degree of bladder distention.

Other:

None.
IMPRESSION: 1. Increased renal echogenicity which may be secondary to medical
renal disease.
2. Simple cyst within the left kidney.

## 2021-04-09 IMAGING — CR DG CHEST 2V
2 series · 2 of 2 positions shown · non-contrast
Comparison: [DATE]

CLINICAL DATA: BILATERAL leg swelling since yesterday, diabetes
mellitus, hypertension, history stroke

EXAM:
CHEST - 2 VIEW

[chest lat]
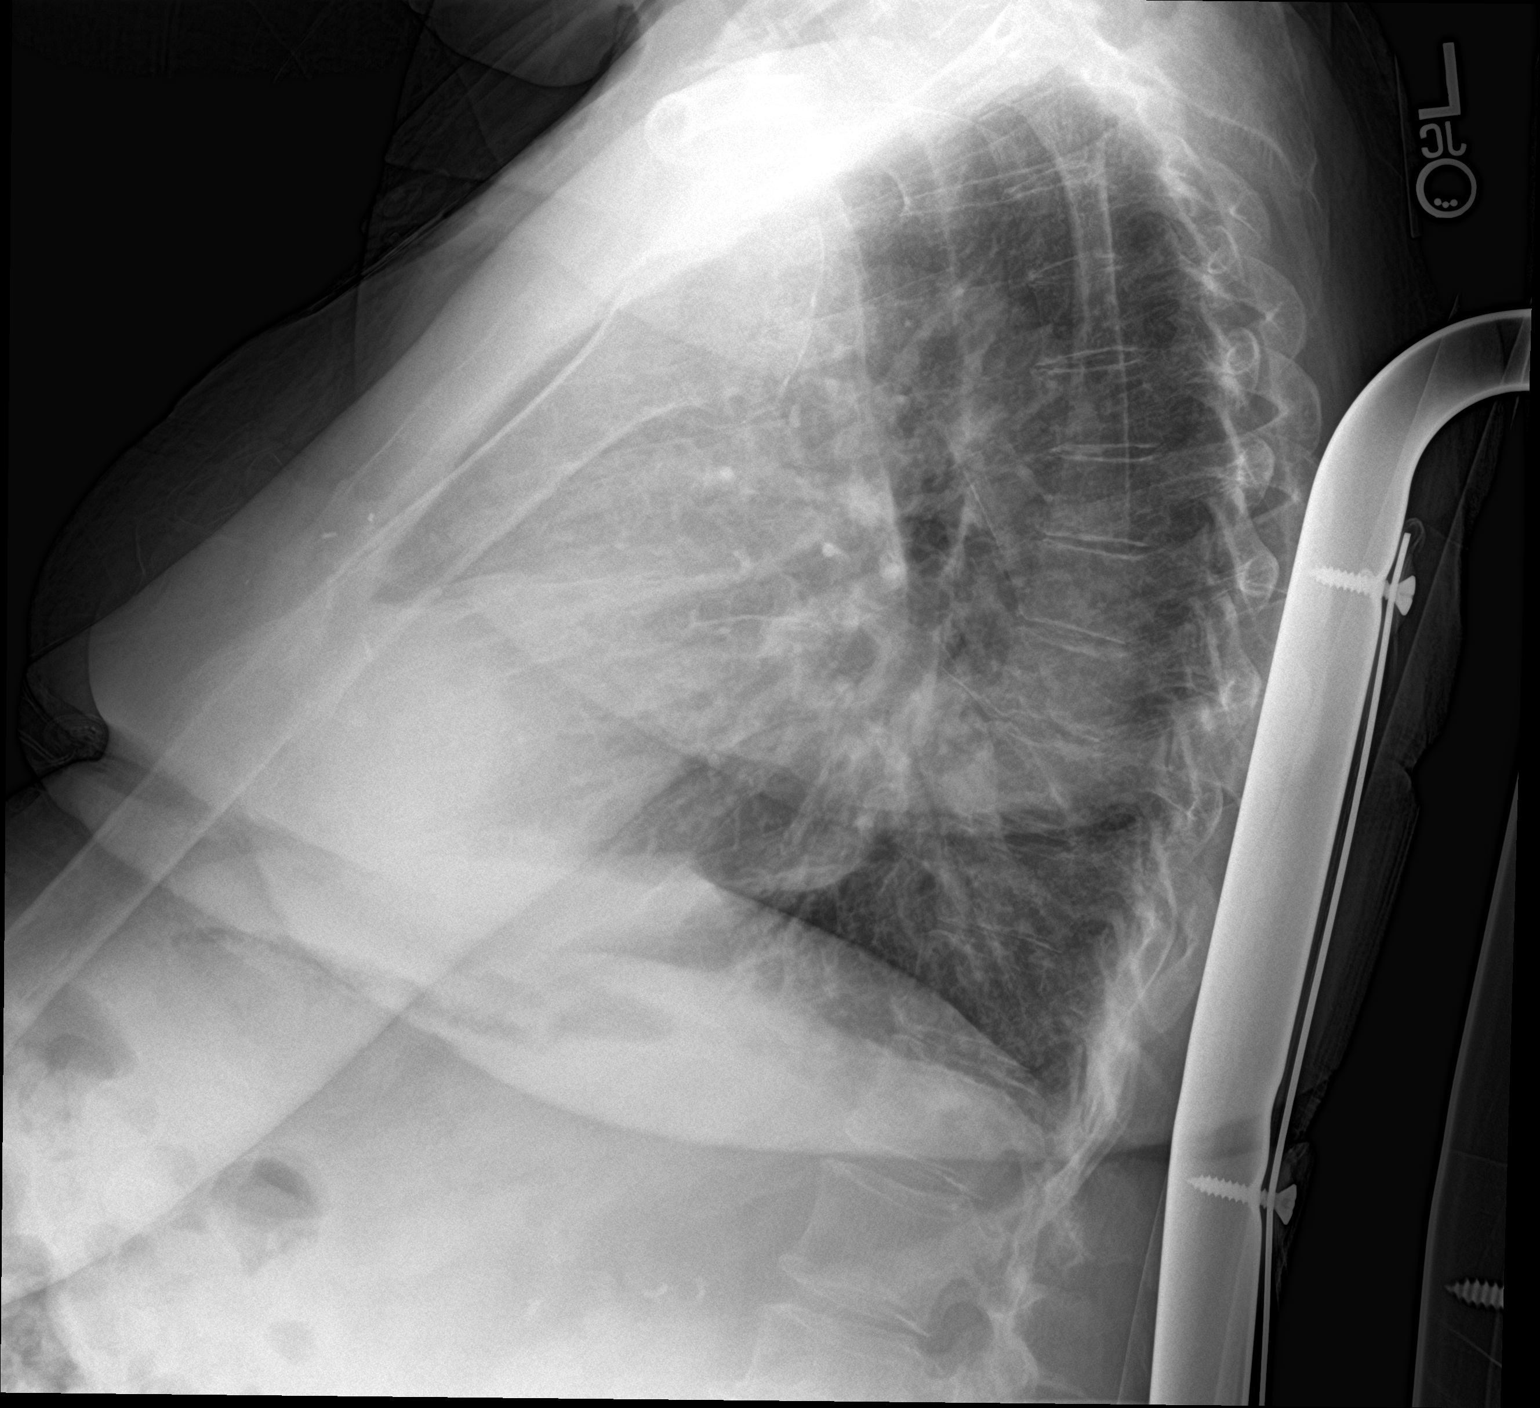

[chest ap]
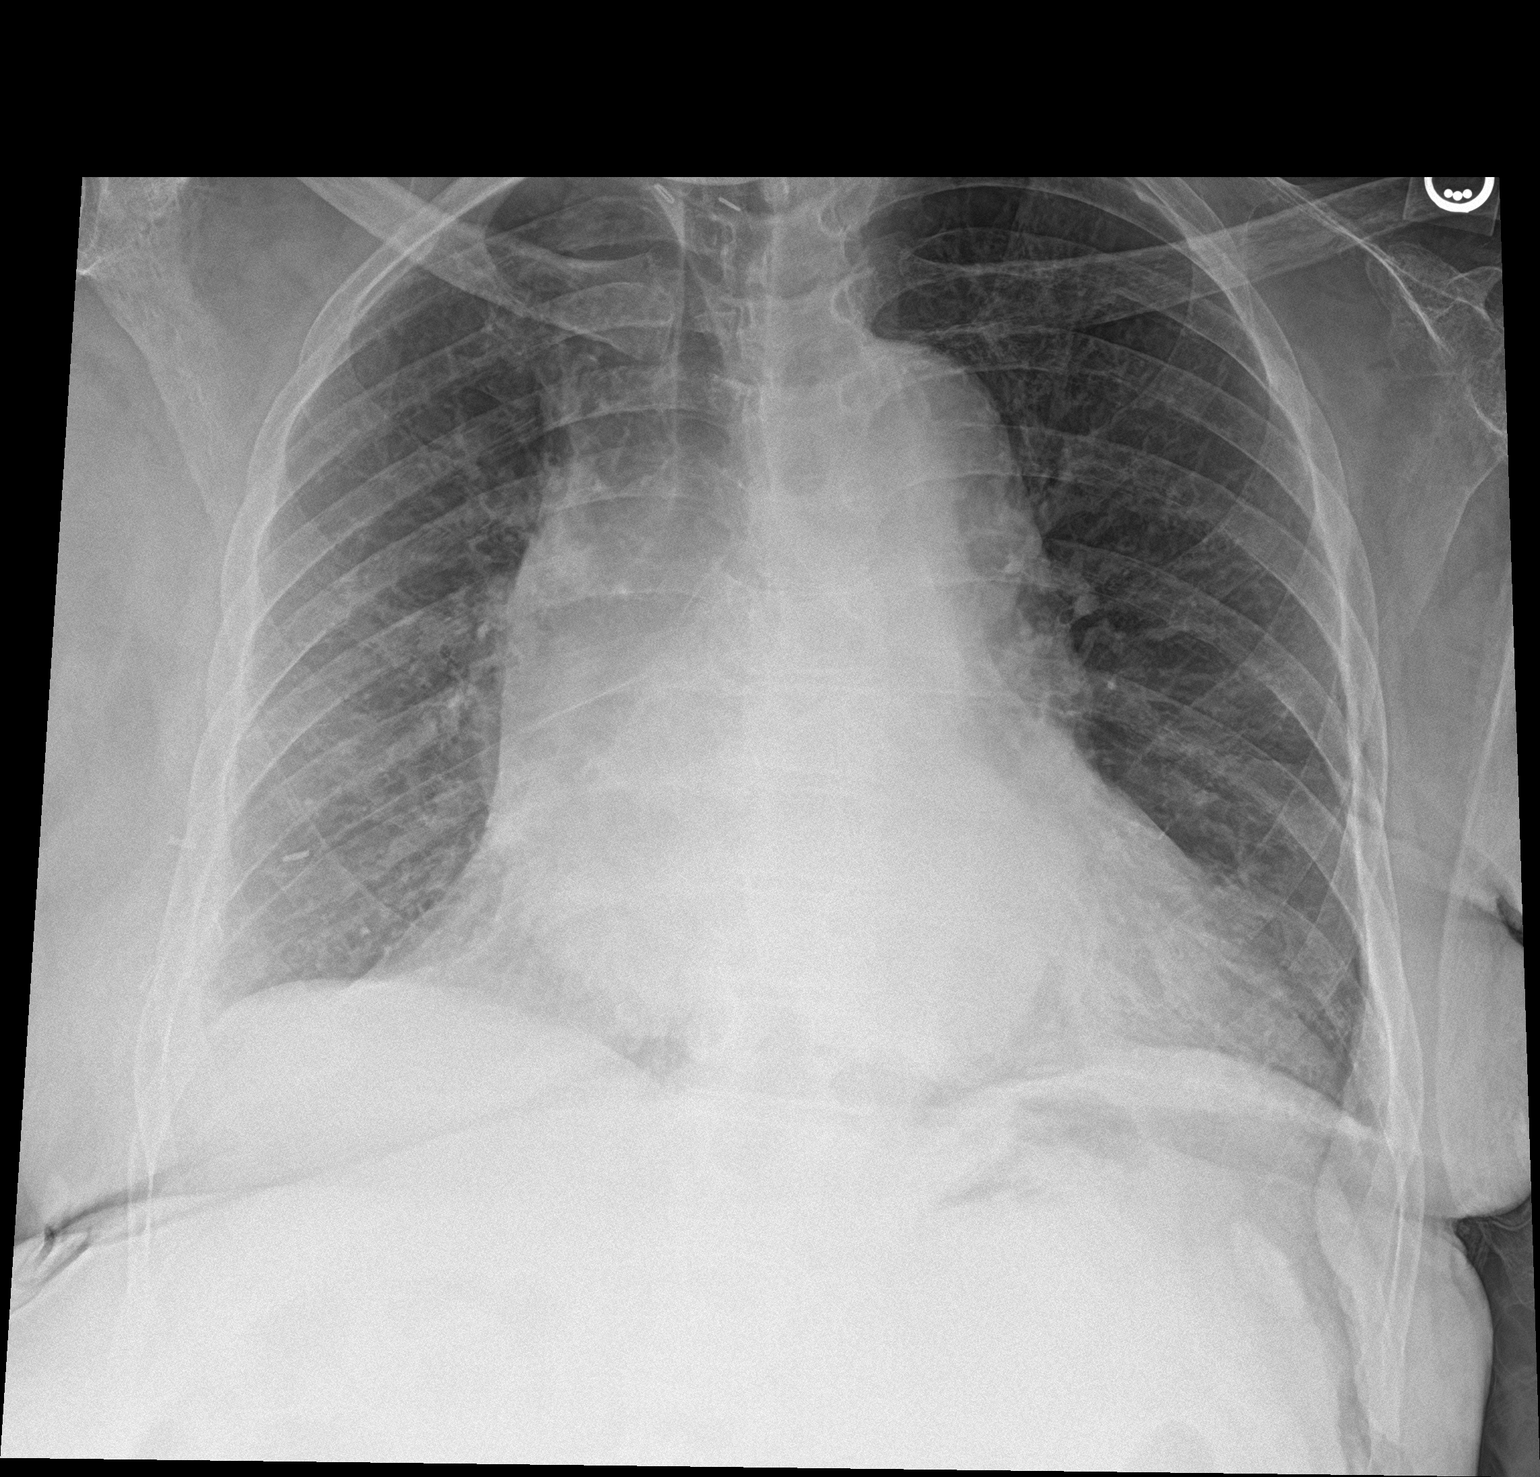

[2 of 2 positions shown; findings below may reference images not displayed]

FINDINGS: Enlargement of cardiac silhouette.

Mild tortuosity and atherosclerotic calcification of thoracic aorta.

Mediastinal contours and pulmonary vascularity normal.

Lungs clear.

No acute infiltrate, pleural effusion, or pneumothorax.

Bones demineralized.
IMPRESSION: Enlargement of cardiac silhouette.

No acute abnormalities.

Aortic Atherosclerosis ([SZ]-[SZ]).

## 2021-04-09 MED ORDER — ACETAMINOPHEN 650 MG RE SUPP
650.0000 mg | Freq: Four times a day (QID) | RECTAL | Status: DC | PRN
  Administered 2021-04-16 – 2021-04-17 (×3): 650 mg via RECTAL
  Filled 2021-04-09 (×3): qty 1

## 2021-04-09 MED ORDER — ZOLPIDEM TARTRATE 5 MG PO TABS: 5.0000 mg | ORAL_TABLET | Freq: Every evening | ORAL | Status: DC | PRN

## 2021-04-09 MED ORDER — CAMPHOR-MENTHOL 0.5-0.5 % EX LOTN
1.0000 "application " | TOPICAL_LOTION | Freq: Three times a day (TID) | CUTANEOUS | Status: DC | PRN
  Filled 2021-04-09: qty 222

## 2021-04-09 MED ORDER — ONDANSETRON HCL 4 MG PO TABS: 4.0000 mg | ORAL_TABLET | Freq: Four times a day (QID) | ORAL | Status: DC | PRN

## 2021-04-09 MED ORDER — NEPRO/CARBSTEADY PO LIQD: 237.0000 mL | Freq: Three times a day (TID) | ORAL | Status: DC | PRN

## 2021-04-09 MED ORDER — STERILE WATER FOR INJECTION IV SOLN
INTRAVENOUS | Status: DC
  Filled 2021-04-09 (×5): qty 1000
  Filled 2021-04-09 (×2): qty 150

## 2021-04-09 MED ORDER — HEPARIN SODIUM (PORCINE) 5000 UNIT/ML IJ SOLN
5000.0000 [IU] | Freq: Three times a day (TID) | INTRAMUSCULAR | Status: DC
  Administered 2021-04-09 – 2021-04-24 (×41): 5000 [IU] via SUBCUTANEOUS
  Filled 2021-04-09 (×41): qty 1

## 2021-04-09 MED ORDER — INSULIN ASPART 100 UNIT/ML IJ SOLN: 0.0000 [IU] | Freq: Three times a day (TID) | INTRAMUSCULAR | Status: DC

## 2021-04-09 MED ORDER — CALCIUM CARBONATE ANTACID 500 MG PO CHEW: 500.0000 mg | CHEWABLE_TABLET | Freq: Four times a day (QID) | ORAL | Status: DC | PRN

## 2021-04-09 MED ORDER — DOCUSATE SODIUM 283 MG RE ENEM
1.0000 | ENEMA | RECTAL | Status: DC | PRN
  Filled 2021-04-09: qty 1

## 2021-04-09 MED ORDER — HYDROXYZINE HCL 25 MG PO TABS: 25.0000 mg | ORAL_TABLET | Freq: Three times a day (TID) | ORAL | Status: DC | PRN

## 2021-04-09 MED ORDER — ONDANSETRON HCL 4 MG/2ML IJ SOLN
4.0000 mg | Freq: Four times a day (QID) | INTRAMUSCULAR | Status: DC | PRN
  Administered 2021-04-16 – 2021-04-20 (×2): 4 mg via INTRAVENOUS
  Filled 2021-04-09 (×4): qty 2

## 2021-04-09 MED ORDER — ACETAMINOPHEN 325 MG PO TABS
650.0000 mg | ORAL_TABLET | Freq: Four times a day (QID) | ORAL | Status: DC | PRN
  Administered 2021-04-13 – 2021-04-21 (×4): 650 mg via ORAL
  Filled 2021-04-09 (×6): qty 2

## 2021-04-09 MED ORDER — INSULIN ASPART 100 UNIT/ML IJ SOLN
0.0000 [IU] | Freq: Every day | INTRAMUSCULAR | Status: DC
Start: 2021-04-09 — End: 2021-04-14

## 2021-04-09 MED ORDER — SORBITOL 70 % SOLN
30.0000 mL | Status: DC | PRN
  Administered 2021-04-18: 30 mL via ORAL
  Filled 2021-04-09 (×3): qty 30

## 2021-04-09 NOTE — ED Provider Notes (Signed)
[facility] Emergency Department Provider Note    ____________________________________________   I have reviewed the triage vital signs and the nursing notes.   HISTORY  Chief Complaint Edema   History limited by: Not Limited   HPI Kirsten Mcmahon is a 75 y.o. female who presents to the emergency department today because of concerns for fluid overload.  The patient states that she thinks she has fluid overload because of swelling.  She is notices swelling primarily in her legs and arms.  It has been present for the past couple of days.  She went to her primary care where they gave her a couple pills which she has been taking.  She has not noticed that it has helped at all.  The patient has not had any shortness of breath.  States she does have a history of chronic kidney disease.  Thinks that she might be urinating slightly less than she normally does.   Records reviewed. Per medical record review patient has a history of chronic kidney disease.   Past Medical History:  Diagnosis Date  . Cancer [facility]) 2017   Right breast  . Depression   . GERD (gastroesophageal reflux disease)   . Glaucoma   . Hemiparesis ([place])    left side  . High cholesterol   . History of seizure    x 1 - after a spider bite  . History of stroke with residual deficit    left-side weakness  . Hypertension    states BP under control with meds., has been on med. x 2 yr.  . Hypothyroidism   . Non-insulin dependent type 2 diabetes mellitus ([place])   . Overactive bladder   . Stroke [facility])    1998 weakness on left side    Patient Active Problem List   Diagnosis Date Noted  . ARF (acute renal failure) ([place]) 01/18/2021  . Fall 01/17/2021  . Hypertension   . Hypothyroidism   . Stroke ([place])   . GERD (gastroesophageal reflux disease)   . Anemia in chronic kidney disease (CKD)   . Acute metabolic encephalopathy   . Acute renal failure superimposed on stage 3b chronic kidney disease  ([place])   . Type II diabetes mellitus with renal manifestations ([place])   . Abnormal mammogram of left breast 07/30/2018  . Closed displaced oblique fracture of shaft of left humerus 03/14/2018  . Osteopenia 01/20/2018  . Multiple thyroid nodules 07/13/2017  . Tracheal deviation 07/13/2017  . Malignant neoplasm of upper-outer quadrant of right breast in female, estrogen receptor positive ([place]) 01/20/2017  . Primary vulvar squamous cell carcinoma ([place]) 01/20/2017    Past Surgical History:  Procedure Laterality Date  . ABDOMINAL HYSTERECTOMY     complete  . BREAST BIOPSY Left 08/03/2018   Benign adipose tissue  . BREAST EXCISIONAL BIOPSY Right 2014   Positive  . BREAST LUMPECTOMY Right   . CATARACT EXTRACTION W/ INTRAOCULAR LENS IMPLANT Left   . CEREBRAL ANEURYSM REPAIR  1998  . PICC LINE INSERTION    . THYROID LOBECTOMY Right 07/15/2017   Procedure: RIGHT THYROID LOBECTOMY;  Surgeon: Armandina Gemma, MD;  Location: [place];  Service: General;  Laterality: Right;    Prior to Admission medications   Medication Sig Start Date End Date Taking? Authorizing Provider  alendronate (FOSAMAX) 70 MG tablet Take 1 tablet (70 mg total) by mouth once a week. Take with a full glass of water on an empty stomach. 09/07/18   Coral Spikes, DO  amLODipine (NORVASC) 2.5 MG tablet Take 2.5 mg by mouth daily. Take one tablet (2.5 mg) by mouth along with one (5 mg) tablet for total 7.5 mg once daily 09/25/20   [provider]  amLODipine (NORVASC) 5 MG tablet Take 1 tablet (5 mg total) by mouth daily. Patient taking differently: Take 5 mg by mouth daily. Take one tablet (5 mg) by mouth along with one (2.5 mg) tablet for total 7.5 mg once daily 09/07/18   Coral Spikes, DO  aspirin EC 81 MG tablet Take 81 mg by mouth daily.    [provider]  cholecalciferol (VITAMIN D) 1000 units tablet Take 1,000 Units by mouth daily.    [provider]  cloNIDine (CATAPRES - DOSED IN MG/24 HR) 0.2 mg/24hr  patch Place 0.2 mg onto the skin once a week. Wednesday 12/19/20   [provider]  latanoprost (XALATAN) 0.005 % ophthalmic solution Place 1 drop into both eyes at bedtime. 09/07/18   Coral Spikes, DO  letrozole [facility]) 2.5 MG tablet Take 1 tablet (2.5 mg total) by mouth daily. 02/16/19   Nicholas Lose, MD  levETIRAcetam (KEPPRA) 500 MG tablet Take 1 tablet (500 mg total) by mouth daily. 01/22/21 02/21/21  Max Sane, MD  levothyroxine (SYNTHROID, LEVOTHROID) 112 MCG tablet Take 1 tablet (112 mcg total) by mouth daily before breakfast. 09/07/18   Coral Spikes, DO  loperamide (IMODIUM) 2 MG capsule Take 2 mg by mouth 2 (two) times daily as needed for diarrhea or loose stools. 12/18/20   [provider]  meclizine (ANTIVERT) 25 MG tablet Take 25 mg by mouth daily as needed for dizziness. 12/01/20   [provider]  metoprolol succinate (TOPROL-XL) 25 MG 24 hr tablet Take 12.5 mg by mouth daily. 08/20/20   [provider]  mirabegron ER (MYRBETRIQ) 25 MG TB24 tablet Take 1 tablet (25 mg total) by mouth daily. 09/07/18   Cook, Jayce G, DO  TYLENOL 325 MG tablet Take 650 mg by mouth daily as needed for pain. 10/02/20   [provider]    Allergies Levemir [insulin detemir]  Family History  Problem Relation Age of Onset  . Cancer Brother        possible prostate cancer per her daughter  . Breast cancer Neg Hx     Social History Social History   Tobacco Use  . Smoking status: Never Smoker  . Smokeless tobacco: Never Used  Vaping Use  . Vaping Use: Never used  Substance Use Topics  . Alcohol use: No  . Drug use: No    Review of Systems Constitutional: No fever/chills Eyes: No visual changes. ENT: No sore throat. Cardiovascular: Denies chest pain. Respiratory: Denies shortness of breath. Gastrointestinal: No abdominal pain.  No nausea, no vomiting.  No diarrhea.   Genitourinary: Negative for dysuria. Musculoskeletal: Positive for swelling to  the arms and legs.  Skin: Negative for rash. Neurological: Negative for headaches, focal weakness or numbness.  ____________________________________________   PHYSICAL EXAM:  VITAL SIGNS: ED Triage Vitals [04/09/21 1543]  Enc Vitals Group     BP (!) 153/100     Pulse Rate 69     Resp 20     Temp      Temp src      SpO2 100 %     Weight 156 lb (70.8 kg)     Height 5\' 2"  (1.575 m)     Head Circumference      Peak Flow  Pain Score 0   Constitutional: Alert and oriented.  Eyes: Conjunctivae are normal.  ENT      Head: Normocephalic and atraumatic.      Nose: No congestion/rhinnorhea.      Mouth/Throat: Mucous membranes are moist.      Neck: No stridor. Hematological/Lymphatic/Immunilogical: No cervical lymphadenopathy. Cardiovascular: Normal rate, regular rhythm.  No murmurs, rubs, or gallops.  Respiratory: Normal respiratory effort without tachypnea nor retractions. Breath sounds are clear and equal bilaterally. No wheezes/rales/rhonchi. Gastrointestinal: Soft and non tender. No rebound. No guarding.  Genitourinary: Deferred Musculoskeletal: Normal range of motion in all extremities. Edema to all extremities.  Neurologic:  Normal speech and language.  Skin:  Skin is warm, dry and intact. No rash noted. Psychiatric: Mood and affect are normal. Speech and behavior are normal. Patient exhibits appropriate insight and judgment.  ____________________________________________    LABS (pertinent positives/negatives)  Trop hs 8 CBC wbc 10.6, hgb 8.1, plt 246 BMP na 137, k 4.9, glu 111, cr 7.99 BNP 376.0 ____________________________________________   EKG  I, Nance Pear, attending physician, personally viewed and interpreted this EKG  EKG Time: 1551 Rate: 73 Rhythm: normal sinus rhythm Axis:  Left axis deviation Intervals: qtc 469 QRS: narrow, q waves V1, V2, V3, V4 ST changes: no st elevation Impression: abnormal  ekg   ____________________________________________    RADIOLOGY  CXR Enlargement of cardiac silhouette.   ____________________________________________   PROCEDURES  Procedures  ____________________________________________   INITIAL IMPRESSION / ASSESSMENT AND PLAN / ED COURSE  Pertinent labs & imaging results that were available during my care of the patient were reviewed by me and considered in my medical decision making (see chart for details).   Patient presented to the emergency department today because of concerns for peripheral edema.  Patient has a known history of chronic kidney disease.  Creatinine today significantly elevated.  I do think this could explain the patient's edema.  Discussed with Dr. Holley Raring with nephrology.  Will plan on admission to the hospital service.  ____________________________________________   FINAL CLINICAL IMPRESSION(S) / ED DIAGNOSES  Final diagnoses:  Peripheral edema  Acute kidney injury [facility])     Note: This dictation was prepared with Dragon dictation. Any transcriptional errors that result from this process are unintentional     Nance Pear, MD 04/09/21 1824

## 2021-04-09 NOTE — ED Triage Notes (Signed)
Pt to ER with complaints of bilateral leg swelling that she noticed started yesterday. Pt denies shortness of breath. Reports she is wheelchair bound.

## 2021-04-09 NOTE — ED Notes (Signed)
Pt unable to sign MSE- e-sig due to condition.  

## 2021-04-09 NOTE — ED Notes (Signed)
Informed RN bed assigned 

## 2021-04-09 NOTE — H&P (Signed)
History and Physical   Kirsten Mcmahon YFV:[id number] DOB: [birth year] DOA: 04/09/2021  Referring MD/NP/PA: Dr. Archie Balboa  PCP: [facility]   Outpatient Specialists: Dr. Zollie Scale, nephrology  Patient coming from: Home  Chief Complaint: Fluid overload  HPI: Kirsten Mcmahon is a 75 y.o. female with medical history significant of chronic kidney disease stage IV, GERD, hypertension, hypothyroidism, non-insulin-dependent diabetes, hyperlipidemia, previous CVA and overactive bladder who has been seeing nephrology but not yet ready for hemodialysis.  She noticed worsening lower extremities edema left more than right.  This has been going on for a number of days.  She went to her PCP where she was placed on increased dose of diuretics.  Swelling did not improve but her renal function appears to have worsened today.  She is seen in the ER with a creatinine of 7.99.  Her previous creatinine was 3.993 months ago.  She is slightly acidotic with CO2 of 17.  Patient also has no significant shortness of breath.  Patient being admitted with acute on chronic kidney injury.  Denied any pain.  No altered mental status.  Potassium appears to be stable also..  ED Course: Temperature 98.2 blood pressure 130/85, pulse 84 respiratory 20 oxygen sat 99% on room air.  White count 10.6 hemoglobin 8.1 platelets 246.  Sodium 137 potassium 4.9 chloride 108 CO2 of 17 BUN 79 creatinine 7.99 calcium 7.3.  Glucose is 111.  Chest x-ray showed cardiomegaly otherwise no acute findings.  Renal ultrasound showed medical renal disease no hydronephrosis no sign of obstruction.  Patient is admitted with acute on chronic kidney disease stage IV.  Review of Systems: As per HPI otherwise 10 point review of systems negative.    Past Medical History:  Diagnosis Date  . Cancer [facility]) 2017   Right breast  . Depression   . GERD (gastroesophageal reflux disease)   . Glaucoma   . Hemiparesis ([place])    left side  . High  cholesterol   . History of seizure    x 1 - after a spider bite  . History of stroke with residual deficit    left-side weakness  . Hypertension    states BP under control with meds., has been on med. x 2 yr.  . Hypothyroidism   . Non-insulin dependent type 2 diabetes mellitus ([place])   . Overactive bladder   . Stroke [facility])    1998 weakness on left side    Past Surgical History:  Procedure Laterality Date  . ABDOMINAL HYSTERECTOMY     complete  . BREAST BIOPSY Left 08/03/2018   Benign adipose tissue  . BREAST EXCISIONAL BIOPSY Right 2014   Positive  . BREAST LUMPECTOMY Right   . CATARACT EXTRACTION W/ INTRAOCULAR LENS IMPLANT Left   . CEREBRAL ANEURYSM REPAIR  1998  . PICC LINE INSERTION    . THYROID LOBECTOMY Right 07/15/2017   Procedure: RIGHT THYROID LOBECTOMY;  Surgeon: Armandina Gemma, MD;  Location: [place];  Service: General;  Laterality: Right;     reports that she has never smoked. She has never used smokeless tobacco. She reports that she does not drink alcohol and does not use drugs.  Allergies  Allergen Reactions  . Levemir [Insulin Detemir] Itching    Family History  Problem Relation Age of Onset  . Cancer Brother        possible prostate cancer per her daughter  . Breast cancer Neg Hx      Prior to Admission medications  Medication Sig Start Date End Date Taking? Authorizing Provider  alendronate (FOSAMAX) 70 MG tablet Take 1 tablet (70 mg total) by mouth once a week. Take with a full glass of water on an empty stomach. 09/07/18   Coral Spikes, DO  amLODipine (NORVASC) 2.5 MG tablet Take 2.5 mg by mouth daily. Take one tablet (2.5 mg) by mouth along with one (5 mg) tablet for total 7.5 mg once daily 09/25/20   [provider]  amLODipine (NORVASC) 5 MG tablet Take 1 tablet (5 mg total) by mouth daily. Patient taking differently: Take 5 mg by mouth daily. Take one tablet (5 mg) by mouth along with one (2.5 mg) tablet for total 7.5 mg once daily 09/07/18    Coral Spikes, DO  aspirin EC 81 MG tablet Take 81 mg by mouth daily.    [provider]  cholecalciferol (VITAMIN D) 1000 units tablet Take 1,000 Units by mouth daily.    [provider]  cloNIDine (CATAPRES - DOSED IN MG/24 HR) 0.2 mg/24hr patch Place 0.2 mg onto the skin once a week. Wednesday 12/19/20   [provider]  latanoprost (XALATAN) 0.005 % ophthalmic solution Place 1 drop into both eyes at bedtime. 09/07/18   Coral Spikes, DO  letrozole [facility]) 2.5 MG tablet Take 1 tablet (2.5 mg total) by mouth daily. 02/16/19   Nicholas Lose, MD  levETIRAcetam (KEPPRA) 500 MG tablet Take 1 tablet (500 mg total) by mouth daily. 01/22/21 02/21/21  Max Sane, MD  levothyroxine (SYNTHROID, LEVOTHROID) 112 MCG tablet Take 1 tablet (112 mcg total) by mouth daily before breakfast. 09/07/18   Coral Spikes, DO  loperamide (IMODIUM) 2 MG capsule Take 2 mg by mouth 2 (two) times daily as needed for diarrhea or loose stools. 12/18/20   [provider]  meclizine (ANTIVERT) 25 MG tablet Take 25 mg by mouth daily as needed for dizziness. 12/01/20   [provider]  metoprolol succinate (TOPROL-XL) 25 MG 24 hr tablet Take 12.5 mg by mouth daily. 08/20/20   [provider]  mirabegron ER (MYRBETRIQ) 25 MG TB24 tablet Take 1 tablet (25 mg total) by mouth daily. 09/07/18   Cook, Jayce G, DO  TYLENOL 325 MG tablet Take 650 mg by mouth daily as needed for pain. 10/02/20   [provider]    Physical Exam: Vitals:   04/09/21 1543 04/09/21 1730  BP: (!) 153/100 (!) 145/77  Pulse: 69 71  Resp: 20   SpO2: 100% 100%  Weight: 70.8 kg   Height: 5\' 2"  (1.575 m)       Constitutional: Acutely ill looking no distress Vitals:   04/09/21 1543 04/09/21 1730  BP: (!) 153/100 (!) 145/77  Pulse: 69 71  Resp: 20   SpO2: 100% 100%  Weight: 70.8 kg   Height: 5\' 2"  (1.575 m)    Eyes: PERRL, lids and conjunctivae normal ENMT: Mucous membranes are moist.  Posterior pharynx clear of any exudate or lesions.Normal dentition.  Neck: normal, supple, no masses, no thyromegaly Respiratory: clear to auscultation bilaterally, no wheezing, no crackles. Normal respiratory effort. No accessory muscle use.  Cardiovascular: Regular rate and rhythm, no murmurs / rubs / gallops.  2+ pedal edema left more than right. 2+ pedal pulses. No carotid bruits.  Abdomen: no tenderness, no masses palpated. No hepatosplenomegaly. Bowel sounds positive.  Musculoskeletal: no clubbing / cyanosis. No joint deformity upper and lower extremities. Good ROM, no contractures. Normal muscle tone.  Skin: no rashes,  lesions, ulcers. No induration Neurologic: CN 2-12 grossly intact. Sensation intact, DTR normal. Strength 5/5 in all 4.  Psychiatric: Normal judgment and insight. Alert and oriented x 3. Normal mood.     Labs on Admission: I have personally reviewed following labs and imaging studies  CBC: Recent Labs  Lab 04/09/21 1545  WBC 10.6*  HGB 8.1*  HCT 24.8*  MCV 92.2  PLT 025   Basic Metabolic Panel: Recent Labs  Lab 04/09/21 1545  NA 137  K 4.9  CL 108  CO2 17*  GLUCOSE 111*  BUN 79*  CREATININE 7.99*  CALCIUM 7.3*   GFR: Estimated Creatinine Clearance: 5.5 mL/min (A) (by C-G formula based on SCr of 7.99 mg/dL (H)). Liver Function Tests: No results for input(s): AST, ALT, ALKPHOS, BILITOT, PROT, ALBUMIN in the last 168 hours. No results for input(s): LIPASE, AMYLASE in the last 168 hours. No results for input(s): AMMONIA in the last 168 hours. Coagulation Profile: No results for input(s): INR, PROTIME in the last 168 hours. Cardiac Enzymes: No results for input(s): CKTOTAL, CKMB, CKMBINDEX, TROPONINI in the last 168 hours. BNP (last 3 results) No results for input(s): PROBNP in the last 8760 hours. HbA1C: No results for input(s): HGBA1C in the last 72 hours. CBG: No results for input(s): GLUCAP in the last 168 hours. Lipid Profile: No results for  input(s): CHOL, HDL, LDLCALC, TRIG, CHOLHDL, LDLDIRECT in the last 72 hours. Thyroid Function Tests: No results for input(s): TSH, T4TOTAL, FREET4, T3FREE, THYROIDAB in the last 72 hours. Anemia Panel: No results for input(s): VITAMINB12, FOLATE, FERRITIN, TIBC, IRON, RETICCTPCT in the last 72 hours. Urine analysis:    Component Value Date/Time   COLORURINE STRAW (A) 01/17/2021 1733   APPEARANCEUR CLEAR (A) 01/17/2021 1733   LABSPEC 1.009 01/17/2021 1733   PHURINE 6.0 01/17/2021 1733   GLUCOSEU NEGATIVE 01/17/2021 1733   HGBUR SMALL (A) 01/17/2021 1733   BILIRUBINUR NEGATIVE 01/17/2021 1733   KETONESUR NEGATIVE 01/17/2021 1733   PROTEINUR 100 (A) 01/17/2021 1733   NITRITE NEGATIVE 01/17/2021 1733   LEUKOCYTESUR NEGATIVE 01/17/2021 1733   Sepsis Labs: @LABRCNTIP (procalcitonin:4,lacticidven:4) )No results found for this or any previous visit (from the past 240 hour(s)).   Radiological Exams on Admission: DG Chest 2 View  Result Date: 04/09/2021 CLINICAL DATA:  BILATERAL leg swelling since yesterday, diabetes mellitus, hypertension, history stroke EXAM: CHEST - 2 VIEW COMPARISON:  01/18/2021 FINDINGS: Enlargement of cardiac silhouette. Mild tortuosity and atherosclerotic calcification of thoracic aorta. Mediastinal contours and pulmonary vascularity normal. Lungs clear. No acute infiltrate, pleural effusion, or pneumothorax. Bones demineralized. IMPRESSION: Enlargement of cardiac silhouette. No acute abnormalities. Aortic Atherosclerosis (ICD10-I70.0). Electronically Signed   By: Lavonia Dana M.D.   On: 04/09/2021 16:49      Assessment/Plan Principal Problem:   Acute renal failure superimposed on stage 3b chronic kidney disease (HCC) Active Problems:   Hypertension   Hypothyroidism   GERD (gastroesophageal reflux disease)   Anemia in chronic kidney disease (CKD)   Type II diabetes mellitus with renal manifestations ([place])     #1 acute on chronic chronic kidney disease stage IV:  Patient appears to have medical renal disease at this point.  She is not severely acidotic and not hyperkalemic.  No indication for immediate hemodialysis.  We will admit the patient.  Give a mild bicarb drip follow renal function.  Nephrology to see the patient and make further decision on treatment.  #2 lower extremity edema: Left greater than right.  Worried about possible DVT.  I will order Doppler ultrasound.  Start with a D-dimer.  Otherwise could be related to her worsening renal function.  No pulmonary edema at this point.  #3 essential hypertension: Confirm and resume home regimen except nephrotoxic medications.  #4 anemia of chronic disease: Most likely due to chronic renal disease.  #5 type 2 diabetes: Sliding scale insulin.  #6 hypothyroidism: Continue with levothyroxine.  #7 GERD: Confirm and resume PPIs   DVT prophylaxis: Heparin Code Status: Full code Family Communication: No family at bedside Disposition Plan: Home Consults called: Dr. Zollie Scale, nephrology Admission status: Inpatient  Severity of Illness: The appropriate patient status for this patient is INPATIENT. Inpatient status is judged to be reasonable and necessary in order to provide the required intensity of service to ensure the patient's safety. The patient's presenting symptoms, physical exam findings, and initial radiographic and laboratory data in the context of their chronic comorbidities is felt to place them at high risk for further clinical deterioration. Furthermore, it is not anticipated that the patient will be medically stable for discharge from the hospital within 2 midnights of admission. The following factors support the patient status of inpatient.   " The patient's presenting symptoms include lower extremity edema. " The worrisome physical exam findings include bilateral lower extremity edema. " The initial radiographic and laboratory data are worrisome because of worsening renal function. " The  chronic co-morbidities include chronic kidney disease stage IV.   * I certify that at the point of admission it is my clinical judgment that the patient will require inpatient hospital care spanning beyond 2 midnights from the point of admission due to high intensity of service, high risk for further deterioration and high frequency of surveillance required.Barbette Merino MD [facility] Pager [phone]  If 7PM-7AM, please contact night-coverage [email or web address] Password [email or web address]  04/09/2021, 7:06 PM

## 2021-04-10 ENCOUNTER — Encounter
Admission: EM | Disposition: A | Discharge status: Home Health | Payer: Self-pay | Source: Home / Self Care | Attending: Internal Medicine

## 2021-04-10 ENCOUNTER — Inpatient Hospital Stay: Payer: Medicare (Managed Care)

## 2021-04-10 ENCOUNTER — Other Ambulatory Visit (INDEPENDENT_AMBULATORY_CARE_PROVIDER_SITE_OTHER): Payer: Self-pay | Admitting: Vascular Surgery

## 2021-04-10 DIAGNOSIS — N179 Acute kidney failure, unspecified: Principal | ICD-10-CM

## 2021-04-10 DIAGNOSIS — R609 Edema, unspecified: Secondary | ICD-10-CM | POA: Diagnosis not present

## 2021-04-10 DIAGNOSIS — R600 Localized edema: Secondary | ICD-10-CM | POA: Diagnosis not present

## 2021-04-10 DIAGNOSIS — N185 Chronic kidney disease, stage 5: Secondary | ICD-10-CM

## 2021-04-10 HISTORY — PX: DIALYSIS/PERMA CATHETER INSERTION: CATH118288

## 2021-04-10 LAB — CBC
HCT: 18.6 % — ABNORMAL LOW (ref 36.0–46.0)
Hemoglobin: 6.1 g/dL — ABNORMAL LOW (ref 12.0–15.0)
MCH: 29.8 pg (ref 26.0–34.0)
MCHC: 32.8 g/dL (ref 30.0–36.0)
MCV: 90.7 fL (ref 80.0–100.0)
Platelets: 179 10*3/uL (ref 150–400)
RBC: 2.05 MIL/uL — ABNORMAL LOW (ref 3.87–5.11)
RDW: 14.4 % (ref 11.5–15.5)
WBC: 7.4 10*3/uL (ref 4.0–10.5)
nRBC: 0 % (ref 0.0–0.2)

## 2021-04-10 LAB — COMPREHENSIVE METABOLIC PANEL
ALT: 9 U/L (ref 0–44)
AST: 11 U/L — ABNORMAL LOW (ref 15–41)
Albumin: 2.5 g/dL — ABNORMAL LOW (ref 3.5–5.0)
Alkaline Phosphatase: 53 U/L (ref 38–126)
Anion gap: 11 (ref 5–15)
BUN: 71 mg/dL — ABNORMAL HIGH (ref 8–23)
CO2: 25 mmol/L (ref 22–32)
Calcium: 6.2 mg/dL — CL (ref 8.9–10.3)
Chloride: 100 mmol/L (ref 98–111)
Creatinine, Ser: 6.89 mg/dL — ABNORMAL HIGH (ref 0.44–1.00)
GFR, Estimated: 6 mL/min — ABNORMAL LOW (ref >60)
Glucose, Bld: 67 mg/dL — ABNORMAL LOW (ref 70–99)
Potassium: 3.9 mmol/L (ref 3.5–5.1)
Sodium: 136 mmol/L (ref 135–145)
Total Bilirubin: 0.5 mg/dL (ref 0.3–1.2)
Total Protein: 5.6 g/dL — ABNORMAL LOW (ref 6.5–8.1)

## 2021-04-10 LAB — HEMOGLOBIN A1C
Hgb A1c MFr Bld: 5.7 % — ABNORMAL HIGH (ref 4.8–5.6)
Mean Plasma Glucose: 116.89 mg/dL

## 2021-04-10 LAB — GLUCOSE, CAPILLARY
Glucose-Capillary: 63 mg/dL — ABNORMAL LOW (ref 70–99)
Glucose-Capillary: 80 mg/dL (ref 70–99)
Glucose-Capillary: 93 mg/dL (ref 70–99)
Glucose-Capillary: 82 mg/dL (ref 70–99)

## 2021-04-10 LAB — HEPATITIS B SURFACE ANTIGEN: Hepatitis B Surface Ag: NONREACTIVE

## 2021-04-10 LAB — HEPATITIS B CORE ANTIBODY, TOTAL: Hep B Core Total Ab: NONREACTIVE

## 2021-04-10 IMAGING — US US EXTREM LOW VENOUS*L*
1 series · 14 of 24 positions shown · non-contrast
Comparison: None.

CLINICAL DATA: Two day history of left lower extremity edema

EXAM:
LEFT LOWER EXTREMITY VENOUS DOPPLER ULTRASOUND
TECHNIQUE: Gray-scale sonography with compression, as well as color and duplex
ultrasound, were performed to evaluate the deep venous system(s)
from the level of the common femoral vein through the popliteal and
proximal calf veins.

[Series 1: us venous img lower uni left (dvt) · portal-venous · 14 of 35 slices shown]
[im 1/35]
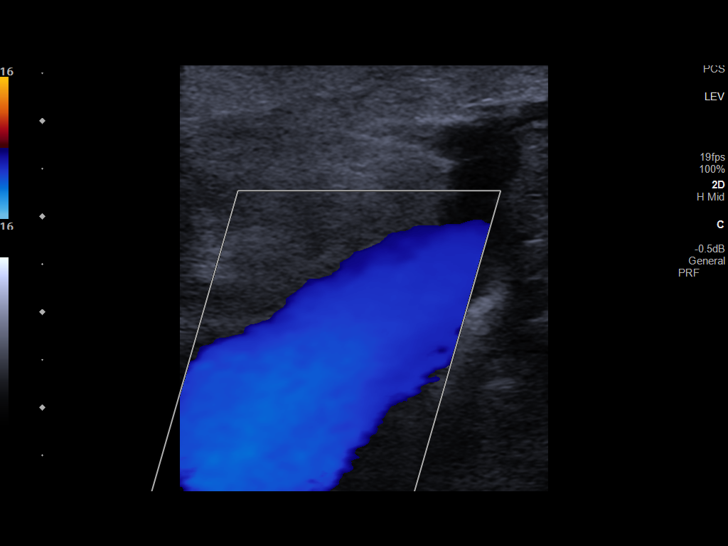
[im 3/35]
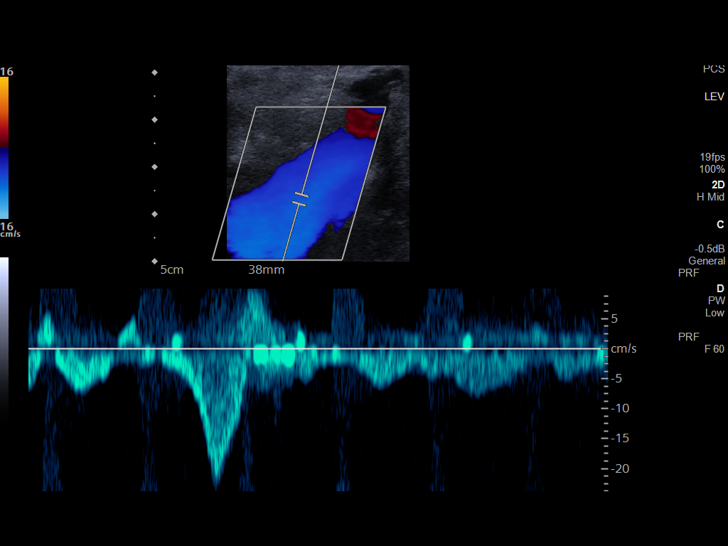
[im 6/35]
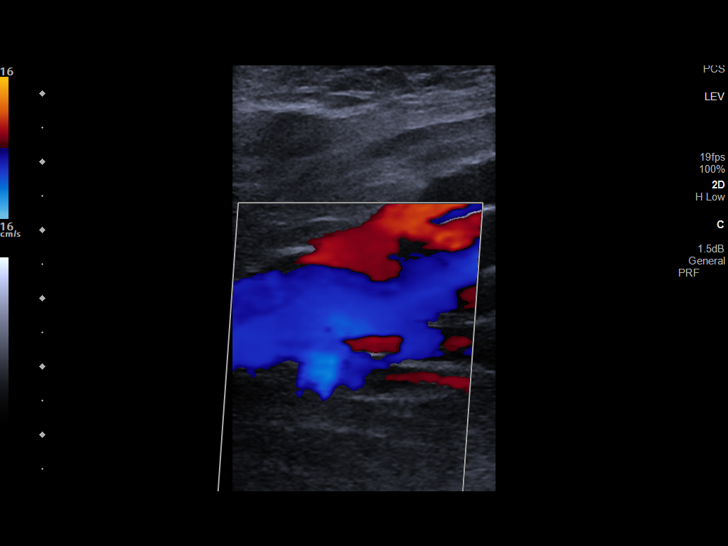
[im 9/35]
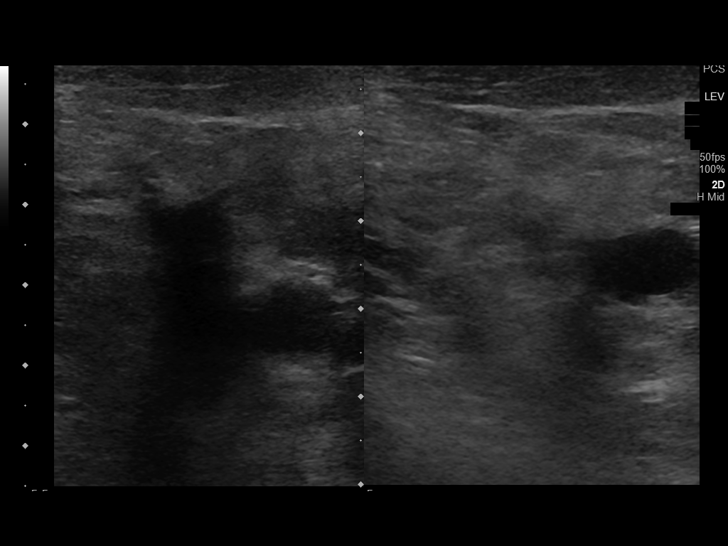
[im 11/35]
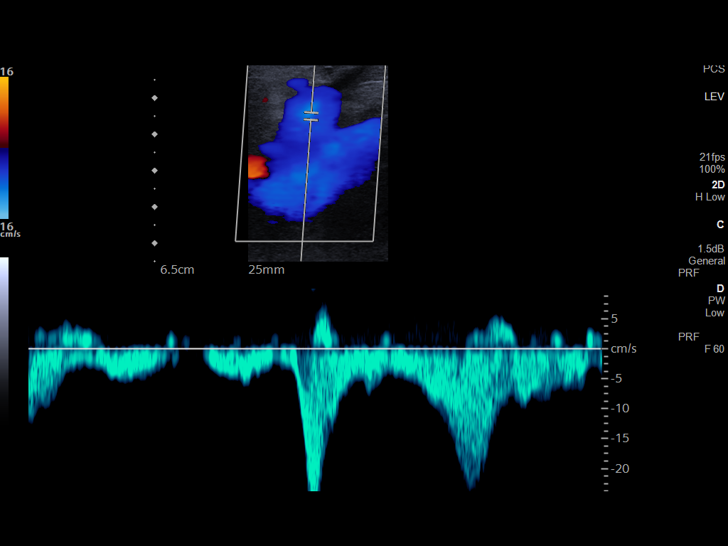
[im 14/35]
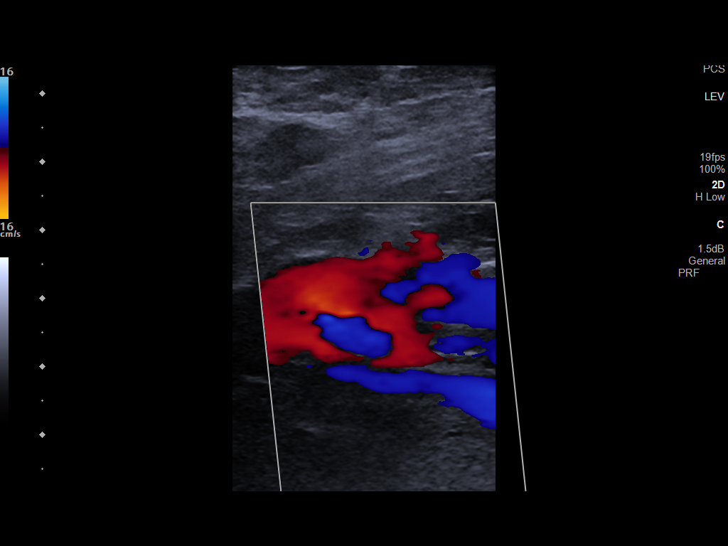
[im 17/35]
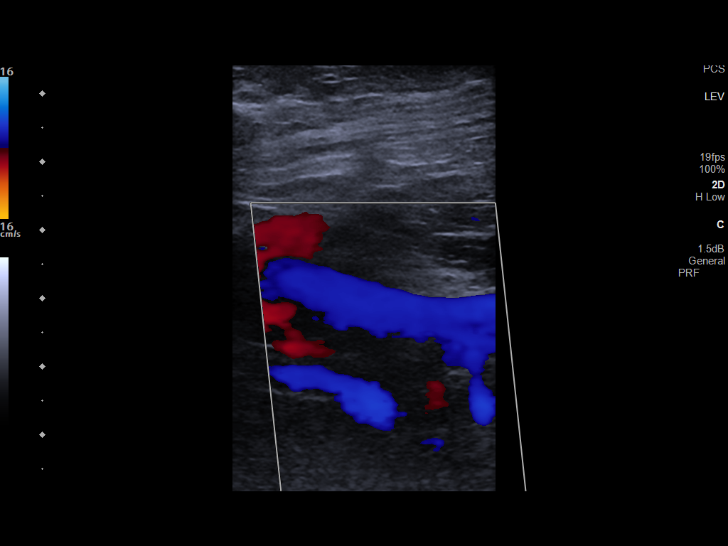
[im 18/35]
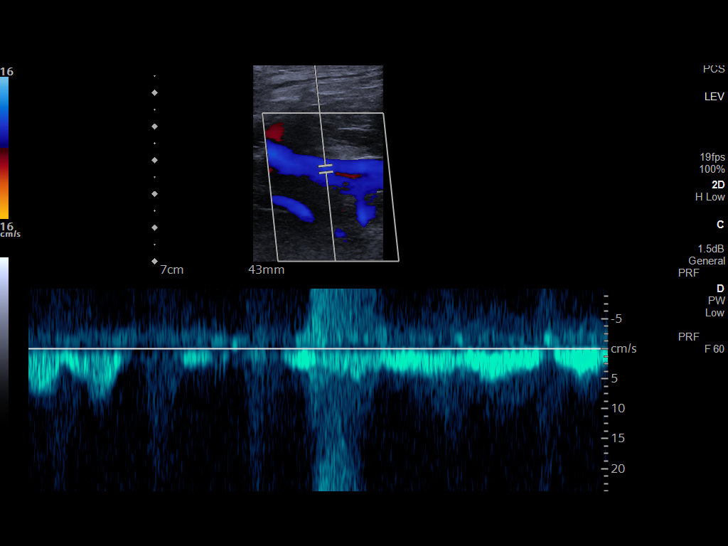
[im 21/35]
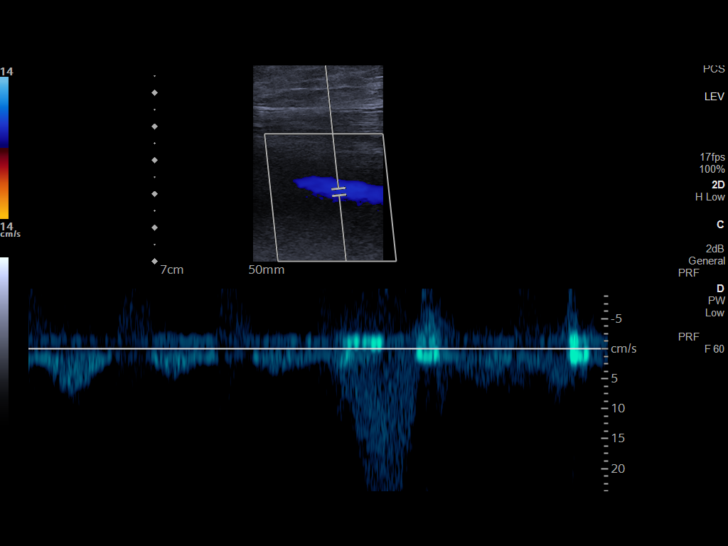
[im 24/35]
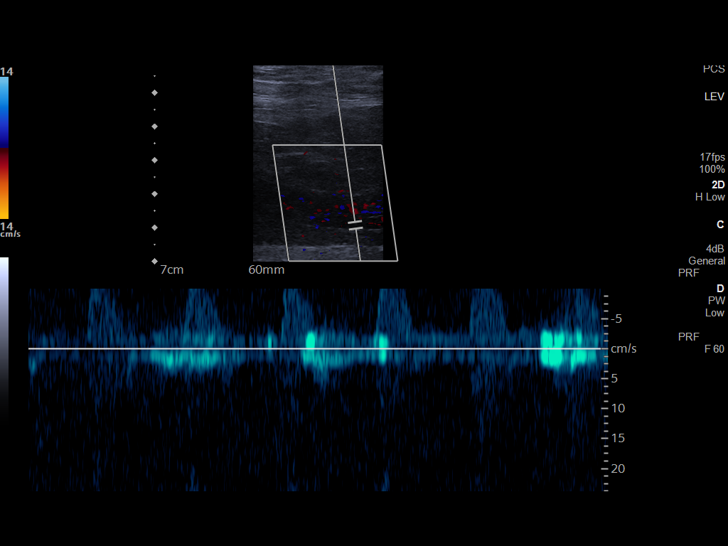
[im 27/35]
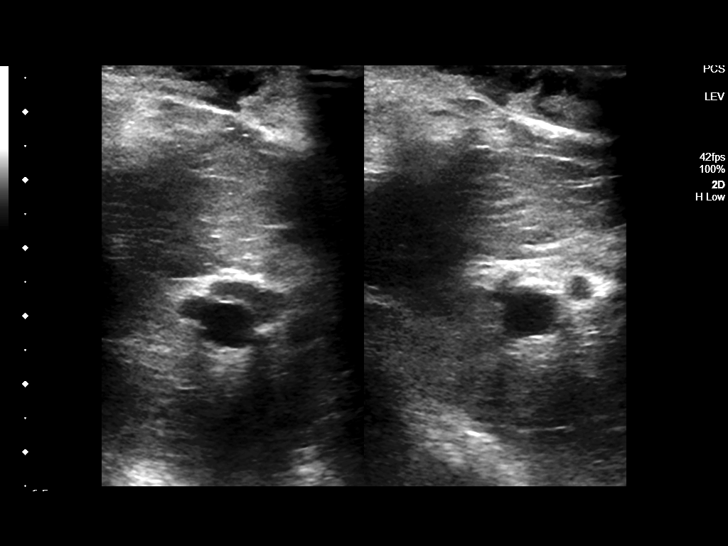
[im 29/35]
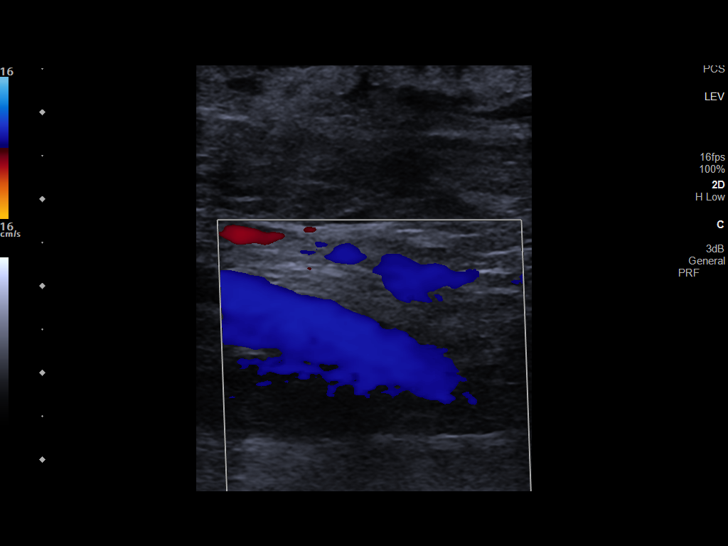
[im 32/35]
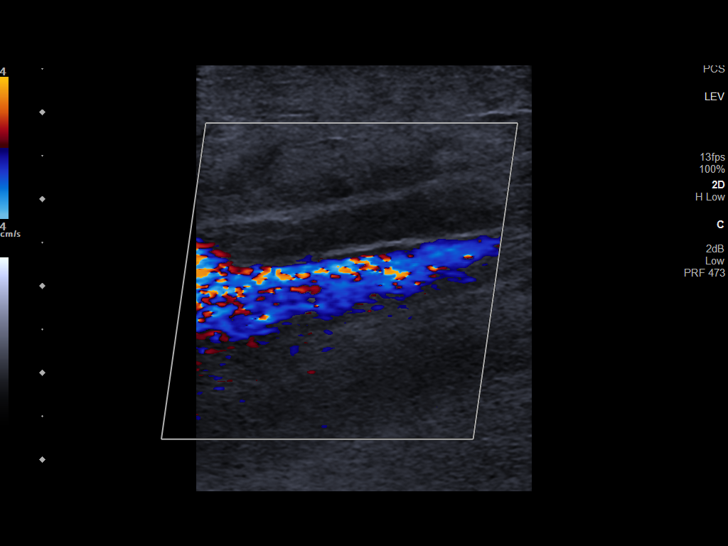
[im 35/35]
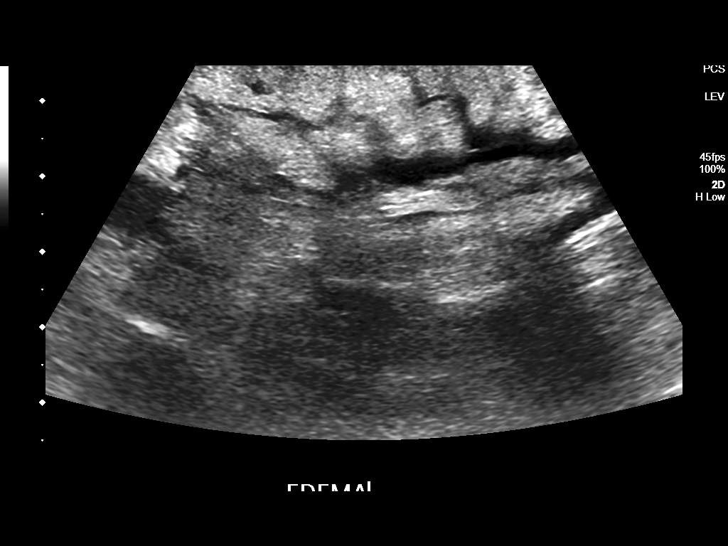

[14 of 24 positions shown; findings below may reference images not displayed]

FINDINGS: VENOUS

Normal compressibility of the common femoral, superficial femoral,
and popliteal veins, as well as the visualized calf veins.
Visualized portions of profunda femoral vein and great saphenous
vein unremarkable. No filling defects to suggest DVT on grayscale or
color Doppler imaging. Doppler waveforms show normal direction of
venous flow, normal respiratory plasticity and response to
augmentation.

Limited views of the contralateral common femoral vein are
unremarkable.

OTHER

Superficial subcutaneous edema in the calf.

Limitations: Calf veins are not well seen.
IMPRESSION: 1. No evidence of deep venous thrombosis to the level of the knee.
Calf veins are not well seen.

## 2021-04-10 SURGERY — DIALYSIS/PERMA CATHETER INSERTION
Anesthesia: Moderate Sedation

## 2021-04-10 MED ORDER — MIDAZOLAM HCL 2 MG/2ML IJ SOLN
INTRAMUSCULAR | Status: DC | PRN
  Administered 2021-04-10 (×2): 1 mg via INTRAVENOUS

## 2021-04-10 MED ORDER — FENTANYL CITRATE (PF) 100 MCG/2ML IJ SOLN
INTRAMUSCULAR | Status: DC | PRN
  Administered 2021-04-10 (×2): 50 ug via INTRAVENOUS

## 2021-04-10 MED ORDER — HYDROMORPHONE HCL 1 MG/ML IJ SOLN: 1.0000 mg | Freq: Once | INTRAMUSCULAR | Status: DC | PRN

## 2021-04-10 MED ORDER — LETROZOLE 2.5 MG PO TABS
2.5000 mg | ORAL_TABLET | Freq: Every day | ORAL | Status: DC
  Administered 2021-04-10 – 2021-04-24 (×13): 2.5 mg via ORAL
  Filled 2021-04-10 (×15): qty 1

## 2021-04-10 MED ORDER — FENTANYL CITRATE (PF) 100 MCG/2ML IJ SOLN
INTRAMUSCULAR | Status: AC
  Filled 2021-04-10: qty 2

## 2021-04-10 MED ORDER — SODIUM CHLORIDE 0.9 % IV SOLN: INTRAVENOUS | Status: DC

## 2021-04-10 MED ORDER — MIDAZOLAM HCL 2 MG/2ML IJ SOLN
INTRAMUSCULAR | Status: AC
  Filled 2021-04-10: qty 2

## 2021-04-10 MED ORDER — SODIUM CHLORIDE 0.9 % IV SOLN: 1.0000 g | INTRAVENOUS | Status: AC

## 2021-04-10 MED ORDER — ONDANSETRON HCL 4 MG/2ML IJ SOLN
4.0000 mg | Freq: Four times a day (QID) | INTRAMUSCULAR | Status: DC | PRN
  Administered 2021-04-11 (×2): 4 mg via INTRAVENOUS

## 2021-04-10 MED ORDER — VITAMIN D 25 MCG (1000 UNIT) PO TABS
1000.0000 [IU] | ORAL_TABLET | Freq: Every day | ORAL | Status: DC
  Administered 2021-04-10 – 2021-04-24 (×12): 1000 [IU] via ORAL
  Filled 2021-04-10 (×13): qty 1

## 2021-04-10 MED ORDER — METOPROLOL SUCCINATE ER 50 MG PO TB24
50.0000 mg | ORAL_TABLET | Freq: Every day | ORAL | Status: DC
  Administered 2021-04-10 – 2021-04-24 (×11): 50 mg via ORAL
  Filled 2021-04-10 (×13): qty 1

## 2021-04-10 MED ORDER — LATANOPROST 0.005 % OP SOLN
1.0000 [drp] | Freq: Every day | OPHTHALMIC | Status: DC
  Administered 2021-04-10 – 2021-04-23 (×14): 1 [drp] via OPHTHALMIC
  Filled 2021-04-10: qty 2.5

## 2021-04-10 MED ORDER — AMLODIPINE BESYLATE 10 MG PO TABS
10.0000 mg | ORAL_TABLET | Freq: Every day | ORAL | Status: DC
  Administered 2021-04-10 – 2021-04-24 (×11): 10 mg via ORAL
  Filled 2021-04-10 (×13): qty 1

## 2021-04-10 MED ORDER — MIDAZOLAM HCL 2 MG/ML PO SYRP: 8.0000 mg | ORAL_SOLUTION | Freq: Once | ORAL | Status: DC | PRN

## 2021-04-10 MED ORDER — METHYLPREDNISOLONE SODIUM SUCC 125 MG IJ SOLR: 125.0000 mg | Freq: Once | INTRAMUSCULAR | Status: DC | PRN

## 2021-04-10 MED ORDER — CLONIDINE HCL 0.2 MG/24HR TD PTWK
0.2000 mg | MEDICATED_PATCH | TRANSDERMAL | Status: DC
  Administered 2021-04-15: 0.2 mg via TRANSDERMAL
  Filled 2021-04-10: qty 1

## 2021-04-10 MED ORDER — DIPHENHYDRAMINE HCL 50 MG/ML IJ SOLN: 50.0000 mg | Freq: Once | INTRAMUSCULAR | Status: DC | PRN

## 2021-04-10 MED ORDER — CHLORHEXIDINE GLUCONATE CLOTH 2 % EX PADS
6.0000 | MEDICATED_PAD | Freq: Every day | CUTANEOUS | Status: DC
  Administered 2021-04-11 – 2021-04-24 (×13): 6 via TOPICAL

## 2021-04-10 MED ORDER — PNEUMOCOCCAL VAC POLYVALENT 25 MCG/0.5ML IJ INJ
0.5000 mL | INJECTION | INTRAMUSCULAR | Status: AC
  Administered 2021-04-14: 0.5 mL via INTRAMUSCULAR
  Filled 2021-04-10: qty 0.5

## 2021-04-10 MED ORDER — FAMOTIDINE 20 MG PO TABS: 40.0000 mg | ORAL_TABLET | Freq: Once | ORAL | Status: DC | PRN

## 2021-04-10 MED ORDER — SODIUM CHLORIDE 0.9 % IV SOLN
INTRAVENOUS | Status: AC
  Administered 2021-04-10: 1 g via INTRAVENOUS
  Filled 2021-04-10: qty 10

## 2021-04-10 MED ORDER — LEVETIRACETAM ER 500 MG PO TB24
500.0000 mg | ORAL_TABLET | Freq: Every day | ORAL | Status: DC
  Administered 2021-04-10 – 2021-04-14 (×5): 500 mg via ORAL
  Filled 2021-04-10 (×6): qty 1

## 2021-04-10 MED ORDER — LEVOTHYROXINE SODIUM 112 MCG PO TABS
112.0000 ug | ORAL_TABLET | Freq: Every day | ORAL | Status: DC
  Administered 2021-04-10 – 2021-04-24 (×12): 112 ug via ORAL
  Filled 2021-04-10 (×16): qty 1

## 2021-04-10 SURGICAL SUPPLY — 9 items
ADH SKN CLS APL DERMABOND .7 (GAUZE/BANDAGES/DRESSINGS) ×1
BIOPATCH RED 1 DISK 7.0 (GAUZE/BANDAGES/DRESSINGS) ×1 IMPLANT
CATH CANNON HEMO 15FR 19 (HEMODIALYSIS SUPPLIES) ×1 IMPLANT
COVER PROBE U/S 5X48 (MISCELLANEOUS) ×1 IMPLANT
DERMABOND ADVANCED (GAUZE/BANDAGES/DRESSINGS) ×1
DERMABOND ADVANCED .7 DNX12 (GAUZE/BANDAGES/DRESSINGS) IMPLANT
PACK ANGIOGRAPHY (CUSTOM PROCEDURE TRAY) ×1 IMPLANT
SUT MNCRL AB 4-0 PS2 18 (SUTURE) ×1 IMPLANT
SUT PROLENE 0 CT 1 30 (SUTURE) ×1 IMPLANT

## 2021-04-10 NOTE — Progress Notes (Signed)
This RN notified of low blood sugar 64. Juice and crackers provided. Blood sugar rechecked, 80. Will continue to monitor.

## 2021-04-10 NOTE — Progress Notes (Signed)
[place] [facility] [address] NAME: Kirsten Mcmahon    MR#:  [id number]  DATE OF BIRTH:  Aug 07, 1946  SUBJECTIVE:  patient came in with swelling of her left lower extremity and puffy face for last few days. [facility]. Denies any chest pain. No family in the room. She is not able to give me all the details about her medical problem. Spoke with son on the phone  REVIEW OF SYSTEMS:   Review of Systems  Constitutional: Negative for chills, fever and weight loss.  HENT: Negative for ear discharge, ear pain and nosebleeds.   Eyes: Negative for blurred vision, pain and discharge.  Respiratory: Negative for sputum production, shortness of breath, wheezing and stridor.   Cardiovascular: Positive for leg swelling. Negative for chest pain, palpitations, orthopnea and PND.  Gastrointestinal: Negative for abdominal pain, diarrhea, nausea and vomiting.  Genitourinary: Negative for frequency and urgency.  Musculoskeletal: Negative for back pain and joint pain.  Neurological: Positive for weakness. Negative for sensory change, speech change and focal weakness.  Psychiatric/Behavioral: Negative for depression and hallucinations. The patient is not nervous/anxious.    Tolerating Diet: yes Tolerating PT: pending  DRUG ALLERGIES:   Allergies  Allergen Reactions  . Contrast Media [Iodinated Diagnostic Agents]     Other reaction(s): NO ALLERGY  . Latex     Other reaction(s): NO ALLERGY  . Shellfish-Derived Products     Other reaction(s): NO ALLERGY  . Levemir [Insulin Detemir] Itching    VITALS:  Blood pressure (!) 160/76, pulse 79, temperature 97.7 F (36.5 C), resp. rate 16, height 5\' 2"  (1.575 m), weight 71.7 kg, SpO2 100 %.  PHYSICAL EXAMINATION:   Physical Exam  GENERAL:  75 y.o.-year-old patient lying in the bed with no acute distress.Mornid obesity  HEENT: Head atraumatic, normocephalic. Oropharynx and nasopharynx clear.   LUNGS: Normal breath sounds bilaterally, no wheezing, rales, rhonchi. No use of accessory muscles of respiration.  CARDIOVASCULAR: S1, S2 normal. No murmurs, rubs, or gallops.  ABDOMEN: Soft, nontender, nondistended. Bowel sounds present. No organomegaly or mass.  EXTREMITIES: No cyanosis, clubbing or edema b/l.    NEUROLOGIC: left UE chronic weakness for CVA.  PSYCHIATRIC:  patient is alert and oriented x 3.  SKIN: No obvious rash, lesion, or ulcer.   LABORATORY PANEL:  CBC Recent Labs  Lab 04/10/21 0753  WBC 7.4  HGB 6.1*  HCT 18.6*  PLT 179    Chemistries  Recent Labs  Lab 04/10/21 0753  NA 136  K 3.9  CL 100  CO2 25  GLUCOSE 67*  BUN 71*  CREATININE 6.89*  CALCIUM 6.2*  AST 11*  ALT 9  ALKPHOS 53  BILITOT 0.5   Cardiac Enzymes No results for input(s): TROPONINI in the last 168 hours. RADIOLOGY:  DG Chest 2 View  Result Date: 04/09/2021 CLINICAL DATA:  BILATERAL leg swelling since yesterday, diabetes mellitus, hypertension, history stroke EXAM: CHEST - 2 VIEW COMPARISON:  01/18/2021 FINDINGS: Enlargement of cardiac silhouette. Mild tortuosity and atherosclerotic calcification of thoracic aorta. Mediastinal contours and pulmonary vascularity normal. Lungs clear. No acute infiltrate, pleural effusion, or pneumothorax. Bones demineralized. IMPRESSION: Enlargement of cardiac silhouette. No acute abnormalities. Aortic Atherosclerosis (ICD10-I70.0). Electronically Signed   By: Lavonia Dana M.D.   On: 04/09/2021 16:49   US Renal  Result Date: 04/09/2021 CLINICAL DATA:  Acute kidney injury. EXAM: RENAL / URINARY TRACT ULTRASOUND COMPLETE COMPARISON:  None. FINDINGS: Right Kidney: Renal measurements: 8.4  cm x 3.5 cm x 3.8 cm = volume: 59 mL. Diffusely increased echogenicity of the renal parenchyma is noted. No mass or hydronephrosis visualized. Left Kidney: Renal measurements: 9.0 cm x 4.6 cm x 3.8 cm = volume: 83 mL. Diffusely increased echogenicity of the renal parenchyma is  noted. A 2.0 cm x 2.1 cm x 1.8 cm anechoic structure is seen within the mid left kidney. No abnormal flow is noted within this region on color Doppler evaluation. No hydronephrosis is visualized. Bladder: Appears normal for degree of bladder distention. Other: None. IMPRESSION: 1. Increased renal echogenicity which may be secondary to medical renal disease. 2. Simple cyst within the left kidney. Electronically Signed   By: Virgina Norfolk M.D.   On: 04/09/2021 19:20   ASSESSMENT AND PLAN:   Kirsten Mcmahon is a 75 y.o. female with medical history significant of chronic kidney disease stage IV, GERD, hypertension, hypothyroidism, non-insulin-dependent diabetes, hyperlipidemia, previous CVA and overactive bladder who has been seeing nephrology but not yet ready for hemodialysis. She noticed worsening lower extremities edema left more than right.   Acute on chronic chronic kidney disease stage IV-- likely progressing to end-stage: -- Patient appears to have medical renal disease at this point.   --She is not severely acidotic and not hyperkalemic -- Nephrology Dr Juleen China to see the patient   Left Lower extremity edema: Left greater than right.   -- lower extremity Doppler negative for DVT unclear reason for unilateral edema  Essential hypertension: -- amlodipine, metoprolol. Patient on clonidine patch  anemia of chronic disease: Most likely due to chronic renal disease. -- blood transfusion with dialysis  Type 2 diabetes: Sliding scale insulin. --sugars in the 63--93  Hypothyroidism: Continue with levothyroxine.   GERD: PPIs   DVT prophylaxis: Heparin Code Status: Full code Family Communication:  spoke with son Kirsten Mcmahon on the phone Disposition Plan: TBD Consults called:  nephrology Admission status: Inpatient  Procedures:Right IJ HD access Level of care: Med-Surg  Remains inpatient appropriate because:Inpatient level of care appropriate due to severity of  illness   Patient to be started on hemodialysis for new diagnosis of end-stage renal disease.       TOTAL TIME TAKING CARE OF THIS PATIENT: *25* minutes.  >50% time spent on counselling and coordination of care  Note: This dictation was prepared with Dragon dictation along with smaller phrase technology. Any transcriptional errors that result from this process are unintentional.  Fritzi Mandes M.D    [facility]   CC: [facility] ID: Kirsten Mcmahon, female   DOB: 27-Jul-1946, 75 y.o.   MRN: [id number]

## 2021-04-10 NOTE — Consult Note (Signed)
[facility] Consult Note  MRN : [id number]  Kirsten Mcmahon is a 75 y.o. (May 05, 1946) female who presents with chief complaint of  Chief Complaint  Patient presents with  . Edema  .  History of Present Illness: I have been asked to see the patient by Dr. Holley Raring for permcath placement.  The patient has chronic kidney disease stage IV and comes in from home for volume overload and worsening renal failure.  Her creatinine in the emergency department was 8 and she was acidotic as well as fluid overloaded.  She has multiple medical issues as listed below.  She was placed on an increasing dose of diuretics to try to deal with fluid overload by her primary care physician but it worsened.  No fevers or chills.  She does have shortness of breath even at rest.  Current Facility-Administered Medications  Medication Dose Route Frequency Provider Last Rate Last Admin  . 0.9 %  sodium chloride infusion   Intravenous Continuous Stegmayer, Kimberly A, PA-C 10 mL/hr at 04/10/21 1437 New Bag at 04/10/21 1437  . [MAR Hold] acetaminophen (TYLENOL) tablet 650 mg  650 mg Oral Q6H PRN Elwyn Reach, MD       Or  . Doug Sou Hold] acetaminophen (TYLENOL) suppository 650 mg  650 mg Rectal Q6H PRN Elwyn Reach, MD      . Doug Sou Hold] amLODipine (NORVASC) tablet 10 mg  10 mg Oral Daily Elwyn Reach, MD   10 mg at 04/10/21 0841  . [MAR Hold] calcium carbonate (TUMS - dosed in mg elemental calcium) chewable tablet 500 mg of elemental calcium  500 mg of elemental calcium Oral Q6H PRN Elwyn Reach, MD      . Doug Sou Hold] camphor-menthol (SARNA) lotion 1 application  1 application Topical L4Y PRN Elwyn Reach, MD       And  . Doug Sou Hold] hydrOXYzine (ATARAX/VISTARIL) tablet 25 mg  25 mg Oral Q8H PRN Elwyn Reach, MD      . Derrill Memo ON 04/11/2021] ceFAZolin (ANCEF) 1 g in sodium chloride 0.9 % 100 mL IVPB  1 g Intravenous On Call to [place], Janalyn Harder, PA-C      .  [START ON 04/11/2021] Chlorhexidine Gluconate Cloth 2 % PADS 6 each  6 each Topical Q0600 Colon Flattery, NP      . Doug Sou Hold] cholecalciferol (VITAMIN D3) tablet 1,000 Units  1,000 Units Oral Daily Elwyn Reach, MD   1,000 Units at 04/10/21 0841  . [MAR Hold] cloNIDine (CATAPRES - Dosed in mg/24 hr) patch 0.2 mg  0.2 mg Transdermal Weekly Elwyn Reach, MD      . Doug Sou Hold] docusate sodium (ENEMEEZ) enema 283 mg  1 enema Rectal PRN Elwyn Reach, MD      . Doug Sou Hold] feeding supplement (NEPRO CARB STEADY) liquid 237 mL  237 mL Oral TID PRN Elwyn Reach, MD      . Doug Sou Hold] heparin injection 5,000 Units  5,000 Units Subcutaneous Q8H Elwyn Reach, MD   5,000 Units at 04/10/21 1220  . HYDROmorphone (DILAUDID) injection 1 mg  1 mg Intravenous Once PRN Stegmayer, Janalyn Harder, PA-C      . [MAR Hold] insulin aspart (novoLOG) injection 0-5 Units  0-5 Units Subcutaneous QHS Elwyn Reach, MD      . Doug Sou Hold] insulin aspart (novoLOG) injection 0-9 Units  0-9 Units Subcutaneous TID WC Elwyn Reach, MD      . [  MAR Hold] latanoprost (XALATAN) 0.005 % ophthalmic solution 1 drop  1 drop Both Eyes QHS Elwyn Reach, MD      . Doug Sou Hold] letrozole [facility]) tablet 2.5 mg  2.5 mg Oral Daily Gala Romney L, MD   2.5 mg at 04/10/21 0841  . [MAR Hold] levETIRAcetam (KEPPRA XR) 24 hr tablet 500 mg  500 mg Oral Daily Gala Romney L, MD   500 mg at 04/10/21 0841  . [MAR Hold] levothyroxine (SYNTHROID) tablet 112 mcg  112 mcg Oral Q0600 Elwyn Reach, MD   112 mcg at 04/10/21 1696  . [MAR Hold] metoprolol succinate (TOPROL-XL) 24 hr tablet 50 mg  50 mg Oral Daily Elwyn Reach, MD   50 mg at 04/10/21 0841  . midazolam (VERSED) 2 MG/ML syrup 8 mg  8 mg Oral Once PRN Stegmayer, Janalyn Harder, PA-C      . [MAR Hold] ondansetron (ZOFRAN) tablet 4 mg  4 mg Oral Q6H PRN Elwyn Reach, MD       Or  . Doug Sou Hold] ondansetron (ZOFRAN) injection 4 mg  4 mg Intravenous Q6H PRN Elwyn Reach, MD      . ondansetron (ZOFRAN) injection 4 mg  4 mg Intravenous Q6H PRN Stegmayer, Janalyn Harder, PA-C      . [START ON 04/11/2021] pneumococcal 23 valent vaccine (PNEUMOVAX-23) injection 0.5 mL  0.5 mL Intramuscular Tomorrow-1000 Garba, Mohammad L, MD      . sodium bicarbonate 150 mEq in sterile water 1,150 mL infusion   Intravenous Continuous Elwyn Reach, MD   Stopped at 04/10/21 1431  . sodium chloride 0.9 % with ceFAZolin (ANCEF) ADS Med           . Doug Sou Hold] sorbitol 70 % solution 30 mL  30 mL Oral PRN Elwyn Reach, MD      . Doug Sou Hold] zolpidem (AMBIEN) tablet 5 mg  5 mg Oral QHS PRN Elwyn Reach, MD        Past Medical History:  Diagnosis Date  . Cancer [facility]) 2017   Right breast  . Depression   . GERD (gastroesophageal reflux disease)   . Glaucoma   . Hemiparesis ([place])    left side  . High cholesterol   . History of seizure    x 1 - after a spider bite  . History of stroke with residual deficit    left-side weakness  . Hypertension    states BP under control with meds., has been on med. x 2 yr.  . Hypothyroidism   . Non-insulin dependent type 2 diabetes mellitus ([place])   . Overactive bladder   . Stroke [facility])    1998 weakness on left side    Past Surgical History:  Procedure Laterality Date  . ABDOMINAL HYSTERECTOMY     complete  . BREAST BIOPSY Left 08/03/2018   Benign adipose tissue  . BREAST EXCISIONAL BIOPSY Right 2014   Positive  . BREAST LUMPECTOMY Right   . CATARACT EXTRACTION W/ INTRAOCULAR LENS IMPLANT Left   . CEREBRAL ANEURYSM REPAIR  1998  . PICC LINE INSERTION    . THYROID LOBECTOMY Right 07/15/2017   Procedure: RIGHT THYROID LOBECTOMY;  Surgeon: Armandina Gemma, MD;  Location: [facility] OR;  Service: General;  Laterality: Right;     Social History   Tobacco Use  . Smoking status: Never Smoker  . Smokeless tobacco: Never Used  Vaping Use  . Vaping Use: Never used  Substance Use Topics  .  Alcohol use: No  . Drug use: No      Family History  Problem Relation Age of Onset  . Cancer Brother        possible prostate cancer per her daughter  . Breast cancer Neg Hx   No bleeding or clotting disorders.  Allergies  Allergen Reactions  . Contrast Media [Iodinated Diagnostic Agents]     Other reaction(s): NO ALLERGY  . Latex     Other reaction(s): NO ALLERGY  . Shellfish-Derived Products     Other reaction(s): NO ALLERGY  . Levemir [Insulin Detemir] Itching     REVIEW OF SYSTEMS (Negative unless checked)  Constitutional: [] Weight loss  [] Fever  [] Chills Cardiac: [] Chest pain   [] Chest pressure   [] Palpitations   [x] Shortness of breath when laying flat   [x] Shortness of breath at rest   [x] Shortness of breath with exertion. Vascular:  [] Pain in legs with walking   [] Pain in legs at rest   [] Pain in legs when laying flat   [] Claudication   [] Pain in feet when walking  [] Pain in feet at rest  [] Pain in feet when laying flat   [] History of DVT   [] Phlebitis   [] Swelling in legs   [] Varicose veins   [] Non-healing ulcers Pulmonary:   [] Uses home oxygen   [] Productive cough   [] Hemoptysis   [] Wheeze  [] COPD   [] Asthma Neurologic:  [] Dizziness  [] Blackouts   [] Seizures   [x] History of stroke   [] History of TIA  [] Aphasia   [] Temporary blindness   [] Dysphagia   [] Weakness or numbness in arms   [] Weakness or numbness in legs Musculoskeletal:  [x] Arthritis   [] Joint swelling   [x] Joint pain   [] Low back pain Hematologic:  [] Easy bruising  [] Easy bleeding   [] Hypercoagulable state   [x] Anemic  [] Hepatitis Gastrointestinal:  [] Blood in stool   [] Vomiting blood  [] Gastroesophageal reflux/heartburn   [] Difficulty swallowing. Genitourinary:  [x] Chronic kidney disease   [] Difficult urination  [] Frequent urination  [] Burning with urination   [] Blood in urine Skin:  [] Rashes   [] Ulcers   [] Wounds Psychological:  [] History of anxiety   []  History of major depression.  Physical Examination  Vitals:   04/10/21 0500 04/10/21  0840 04/10/21 1219 04/10/21 1421  BP:  139/83 (!) 160/76 (!) 144/94  Pulse:  77 79 80  Resp:  16 16 16   Temp:  (!) 97.4 F (36.3 C) 97.7 F (36.5 C) 97.8 F (36.6 C)  TempSrc:  Oral  Oral  SpO2:  100% 100% 96%  Weight: 71.7 kg     Height:       Body mass index is 28.9 kg/m. Gen:  WD/WN, NAD Head: [place]/AT, No temporalis wasting.  Ear/Nose/Throat: Hearing grossly intact, nares w/o erythema or drainage, oropharynx w/o Erythema/Exudate Eyes: Sclera non-icteric, conjunctiva clear Neck: Trachea midline.  No JVD.  Pulmonary:  Good air movement, respirations somewhat labored at rest Cardiac: RRR, normal S1, S2. Vascular:  Vessel Right Left  Radial Palpable Palpable   Musculoskeletal: M/S 5/5 throughout.  Extremities without ischemic changes.  No deformity or atrophy. 2-3+ BLE edema. Arm edema as well Neurologic: Sensation grossly intact in extremities.  Symmetrical.  Speech is fluent. Motor exam as listed above. Psychiatric: Judgment intact, Mood & affect appropriate for pt's clinical situation. Dermatologic: No rashes or ulcers noted.  No cellulitis or open wounds. Lymph : No Cervical, Axillary, or Inguinal lymphadenopathy.      CBC Lab Results  Component Value Date   WBC 7.4 04/10/2021  HGB 6.1 (L) 04/10/2021   HCT 18.6 (L) 04/10/2021   MCV 90.7 04/10/2021   PLT 179 04/10/2021    BMET    Component Value Date/Time   NA 136 04/10/2021 0753   K 3.9 04/10/2021 0753   CL 100 04/10/2021 0753   CO2 25 04/10/2021 0753   GLUCOSE 67 (L) 04/10/2021 0753   BUN 71 (H) 04/10/2021 0753   CREATININE 6.89 (H) 04/10/2021 0753   CALCIUM 6.2 (LL) 04/10/2021 0753   GFRNONAA 6 (L) 04/10/2021 0753   GFRAA 34 (L) 08/01/2018 1056   Estimated Creatinine Clearance: 6.4 mL/min (A) (by C-G formula based on SCr of 6.89 mg/dL (H)).  COAG No results found for: INR, PROTIME  Radiology DG Chest 2 View  Result Date: 04/09/2021 CLINICAL DATA:  BILATERAL leg swelling since yesterday, diabetes  mellitus, hypertension, history stroke EXAM: CHEST - 2 VIEW COMPARISON:  01/18/2021 FINDINGS: Enlargement of cardiac silhouette. Mild tortuosity and atherosclerotic calcification of thoracic aorta. Mediastinal contours and pulmonary vascularity normal. Lungs clear. No acute infiltrate, pleural effusion, or pneumothorax. Bones demineralized. IMPRESSION: Enlargement of cardiac silhouette. No acute abnormalities. Aortic Atherosclerosis (ICD10-I70.0). Electronically Signed   By: Lavonia Dana M.D.   On: 04/09/2021 16:49   US Renal  Result Date: 04/09/2021 CLINICAL DATA:  Acute kidney injury. EXAM: RENAL / URINARY TRACT ULTRASOUND COMPLETE COMPARISON:  None. FINDINGS: Right Kidney: Renal measurements: 8.4 cm x 3.5 cm x 3.8 cm = volume: 59 mL. Diffusely increased echogenicity of the renal parenchyma is noted. No mass or hydronephrosis visualized. Left Kidney: Renal measurements: 9.0 cm x 4.6 cm x 3.8 cm = volume: 83 mL. Diffusely increased echogenicity of the renal parenchyma is noted. A 2.0 cm x 2.1 cm x 1.8 cm anechoic structure is seen within the mid left kidney. No abnormal flow is noted within this region on color Doppler evaluation. No hydronephrosis is visualized. Bladder: Appears normal for degree of bladder distention. Other: None. IMPRESSION: 1. Increased renal echogenicity which may be secondary to medical renal disease. 2. Simple cyst within the left kidney. Electronically Signed   By: Virgina Norfolk M.D.   On: 04/09/2021 19:20   US Venous Img Lower Unilateral Left (DVT)  Result Date: 04/10/2021 CLINICAL DATA:  Two day history of left lower extremity edema EXAM: LEFT LOWER EXTREMITY VENOUS DOPPLER ULTRASOUND TECHNIQUE: Gray-scale sonography with compression, as well as color and duplex ultrasound, were performed to evaluate the deep venous system(s) from the level of the common femoral vein through the popliteal and proximal calf veins. COMPARISON:  None. FINDINGS: VENOUS Normal compressibility of the  common femoral, superficial femoral, and popliteal veins, as well as the visualized calf veins. Visualized portions of profunda femoral vein and great saphenous vein unremarkable. No filling defects to suggest DVT on grayscale or color Doppler imaging. Doppler waveforms show normal direction of venous flow, normal respiratory plasticity and response to augmentation. Limited views of the contralateral common femoral vein are unremarkable. OTHER Superficial subcutaneous edema in the calf. Limitations: Calf veins are not well seen. IMPRESSION: 1. No evidence of deep venous thrombosis to the level of the knee. Calf veins are not well seen. Electronically Signed   By: Jacqulynn Cadet M.D.   On: 04/10/2021 14:50      Assessment/Plan 1.  Chronic kidney disease now progressed to end-stage renal failure.  PermCath will be placed today.  Risks and benefits are discussed.  She will need work-up for vein mapping and permanent dialysis access placement. 2.  Volume overload.  Secondary to #1 among other issues.  Can go for dialysis as soon as the PermCath is in. 3.  Anemia of chronic disease.  Epogen will be started.  May require blood transfusion at the discretion of the primary care service. 4.  Hypertension.  Likely an underlying cause of her renal failure and blood pressure control important in reducing the progression of atherosclerotic disease. On appropriate oral medications.    Leotis Pain, MD  04/10/2021 3:08 PM    This note was created with Dragon medical transcription system.  Any error is purely unintentional

## 2021-04-10 NOTE — TOC Progression Note (Signed)
Transition of Care [facility]) - Progression Note    Patient Details  Name: Kirsten Mcmahon MRN: [id number] Date of Birth: Oct 14, 1946  [facility] of [facility] CM/SW [place], [place] Phone Number: 04/10/2021, 1:53 PM  Clinical Narrative:      CSW received call from [facility], they report to please keep them updated on patient's medical status.   [facility] rep Roetta Sessions can be reached at [phone].  TOC to continue to follow for discharge planning needs.      Expected Discharge Plan and Services                                                 Social Determinants of Health (SDOH) Interventions    Readmission Risk Interventions No flowsheet data found.

## 2021-04-10 NOTE — Op Note (Signed)
OPERATIVE NOTE    PRE-OPERATIVE DIAGNOSIS: 1. ESRD   POST-OPERATIVE DIAGNOSIS: same as above  PROCEDURE: 1. Ultrasound guidance for vascular access to the right internal jugular vein 2. Fluoroscopic guidance for placement of catheter 3. Placement of a 19 cm tip to cuff tunneled hemodialysis catheter via the right internal jugular vein  SURGEON: Leotis Pain, MD  ANESTHESIA:  Local with Moderate conscious sedation for approximately 27 minutes using 2 mg of Versed and 100 mcg of Fentanyl  ESTIMATED BLOOD LOSS: 10 cc  FLUORO TIME: less than one minute  CONTRAST: none  FINDING(S): 1.  Patent right internal jugular vein  SPECIMEN(S):  None  INDICATIONS:   Kirsten Mcmahon is a 75 y.o.female who presents with renal failure.  The patient needs long term dialysis access for their ESRD, and a Permcath is necessary.  Risks and benefits are discussed and informed consent is obtained.    DESCRIPTION: After obtaining full informed written consent, the patient was brought back to the vascular suited. The patient's right neck and chest were sterilely prepped and draped in a sterile surgical field was created. Moderate conscious sedation was administered during a face to face encounter with the patient throughout the procedure with my supervision of the RN administering medicines and monitoring the patient's vital signs, pulse oximetry, telemetry and mental status throughout from the start of the procedure until the patient was taken to the recovery room.  The right internal jugular vein was visualized with ultrasound and found to be patent. It was then accessed under direct ultrasound guidance and a permanent image was recorded. A wire was placed. After skin nick and dilatation, the peel-away sheath was placed over the wire. I then turned my attention to an area under the clavicle. Approximately 1-2 fingerbreadths below the clavicle a small counterincision was created and tunneled from the  subclavicular incision to the access site. Using fluoroscopic guidance, a 19 centimeter tip to cuff tunneled hemodialysis catheter was selected, and tunneled from the subclavicular incision to the access site. It was then placed through the peel-away sheath and the peel-away sheath was removed. Using fluoroscopic guidance the catheter tips were parked in the right atrium. The appropriate distal connectors were placed. It withdrew blood well and flushed easily with heparinized saline and a concentrated heparin solution was then placed. It was secured to the chest wall with 2 Prolene sutures. The access incision was closed single 4-0 Monocryl. A 4-0 Monocryl pursestring suture was placed around the exit site. Sterile dressings were placed. The patient tolerated the procedure well and was taken to the recovery room in stable condition.  COMPLICATIONS: None  CONDITION: Stable  Leotis Pain, MD 04/10/2021 3:46 PM   This note was created with Dragon Medical transcription system. Any errors in dictation are purely unintentional.

## 2021-04-10 NOTE — Progress Notes (Addendum)
[name]  ROUNDING NOTE   Subjective:   Kirsten Mcmahon is a 75 y.o. with past medical history of GERD, Hypertension, diabetes, hyperlipidemia, CVA and CKD stage 4. She presents to the ED with increased lower extremity edema. She is known to our team from previous admissions.   Patient is seen resting in bed Alert, oriented with delayed responses Denies nausea, diarrhea and states food taste appropriately Denies shortness of breath   Objective:  Vital signs in last 24 hours:  Temp:  [97.4 F (36.3 C)-98.2 F (36.8 C)] 97.7 F (36.5 C) (05/06 1219) Pulse Rate:  [68-84] 79 (05/06 1219) Resp:  [10-20] 16 (05/06 1219) BP: (132-162)/(76-100) 160/76 (05/06 1219) SpO2:  [99 %-100 %] 100 % (05/06 1219) Weight:  [70.8 kg-71.7 kg] 71.7 kg (05/06 0500)  Weight change:  Filed Weights   04/09/21 1543 04/10/21 0500  Weight: 70.8 kg 71.7 kg    Intake/Output: I/O last 3 completed shifts: In: 468 [I.V.:468] Out: -    Intake/Output this shift:  No intake/output data recorded.  Physical Exam: General: NAD, resting in bed  Head: Normocephalic, atraumatic. Moist oral mucosal membranes  Eyes: Anicteric  Lungs:  Wheeze to auscultation  Heart: Regular rate and rhythm  Abdomen:  Soft, nontender,  distended  Extremities:  3+ peripheral edema.  Neurologic: Drowsy, able to answer simple questions, moving all four extremities  Skin: No lesions       Basic Metabolic Panel: Recent Labs  Lab 04/09/21 1545 04/10/21 0753  NA 137 136  K 4.9 3.9  CL 108 100  CO2 17* 25  GLUCOSE 111* 67*  BUN 79* 71*  CREATININE 7.99* 6.89*  CALCIUM 7.3* 6.2*    Liver Function Tests: Recent Labs  Lab 04/10/21 0753  AST 11*  ALT 9  ALKPHOS 53  BILITOT 0.5  PROT 5.6*  ALBUMIN 2.5*   No results for input(s): LIPASE, AMYLASE in the last 168 hours. No results for input(s): AMMONIA in the last 168 hours.  CBC: Recent Labs  Lab 04/09/21 1545 04/10/21 0753  WBC 10.6* 7.4   HGB 8.1* 6.1*  HCT 24.8* 18.6*  MCV 92.2 90.7  PLT 246 179    Cardiac Enzymes: No results for input(s): CKTOTAL, CKMB, CKMBINDEX, TROPONINI in the last 168 hours.  BNP: Invalid input(s): POCBNP  CBG: Recent Labs  Lab 04/09/21 2118 04/10/21 0752 04/10/21 0833 04/10/21 1150  GLUCAP 81 63* 80 93    Microbiology: Results for orders placed or performed during the hospital encounter of 04/09/21  Resp Panel by RT-PCR (Flu A&B, Covid) Nasopharyngeal Swab     Status: None   Collection Time: 04/09/21  5:58 PM   Specimen: Nasopharyngeal Swab; Nasopharyngeal(NP) swabs in vial transport medium  Result Value Ref Range Status   SARS Coronavirus 2 by RT PCR NEGATIVE NEGATIVE Final    Comment: (NOTE) SARS-CoV-2 target nucleic acids are NOT DETECTED.  The SARS-CoV-2 RNA is generally detectable in upper respiratory specimens during the acute phase of infection. The lowest concentration of SARS-CoV-2 viral copies this assay can detect is 138 copies/mL. A negative result does not preclude SARS-Cov-2 infection and should not be used as the sole basis for treatment or other patient management decisions. A negative result may occur with  improper specimen collection/handling, submission of specimen other than nasopharyngeal swab, presence of viral mutation(s) within the areas targeted by this assay, and inadequate number of viral copies(<138 copies/mL). A negative result must be combined with clinical observations, patient history, and epidemiological information.  The expected result is Negative.  Fact Sheet for Patients:  EntrepreneurPulse.com.au  Fact Sheet for Healthcare Providers:  IncredibleEmployment.be  This test is no t yet approved or cleared by the [place] FDA and  has been authorized for detection and/or diagnosis of SARS-CoV-2 by FDA under an Emergency Use Authorization (EUA). This EUA will remain  in effect (meaning this test can  be used) for the duration of the COVID-19 declaration under Section 564(b)(1) of the Act, 21 U.S.C.section 360bbb-3(b)(1), unless the authorization is terminated  or revoked sooner.       Influenza A by PCR NEGATIVE NEGATIVE Final   Influenza B by PCR NEGATIVE NEGATIVE Final    Comment: (NOTE) The Xpert Xpress SARS-CoV-2/FLU/RSV plus assay is intended as an aid in the diagnosis of influenza from Nasopharyngeal swab specimens and should not be used as a sole basis for treatment. Nasal washings and aspirates are unacceptable for Xpert Xpress SARS-CoV-2/FLU/RSV testing.  Fact Sheet for Patients: EntrepreneurPulse.com.au  Fact Sheet for Healthcare Providers: IncredibleEmployment.be  This test is not yet approved or cleared by the [place] FDA and has been authorized for detection and/or diagnosis of SARS-CoV-2 by FDA under an Emergency Use Authorization (EUA). This EUA will remain in effect (meaning this test can be used) for the duration of the COVID-19 declaration under Section 564(b)(1) of the Act, 21 U.S.C. section 360bbb-3(b)(1), unless the authorization is terminated or revoked.  Performed at [facility], [address] [place], [place] [address]     Coagulation Studies: No results for input(s): LABPROT, INR in the last 72 hours.  Urinalysis: No results for input(s): COLORURINE, LABSPEC, PHURINE, GLUCOSEU, HGBUR, BILIRUBINUR, KETONESUR, PROTEINUR, UROBILINOGEN, NITRITE, LEUKOCYTESUR in the last 72 hours.  Invalid input(s): APPERANCEUR    Imaging: DG Chest 2 View  Result Date: 04/09/2021 CLINICAL DATA:  BILATERAL leg swelling since yesterday, diabetes mellitus, hypertension, history stroke EXAM: CHEST - 2 VIEW COMPARISON:  01/18/2021 FINDINGS: Enlargement of cardiac silhouette. Mild tortuosity and atherosclerotic calcification of thoracic aorta. Mediastinal contours and pulmonary vascularity normal. Lungs clear. No  acute infiltrate, pleural effusion, or pneumothorax. Bones demineralized. IMPRESSION: Enlargement of cardiac silhouette. No acute abnormalities. Aortic Atherosclerosis (ICD10-I70.0). Electronically Signed   By: Lavonia Dana M.D.   On: 04/09/2021 16:49   US Renal  Result Date: 04/09/2021 CLINICAL DATA:  Acute kidney injury. EXAM: RENAL / URINARY TRACT ULTRASOUND COMPLETE COMPARISON:  None. FINDINGS: Right Kidney: Renal measurements: 8.4 cm x 3.5 cm x 3.8 cm = volume: 59 mL. Diffusely increased echogenicity of the renal parenchyma is noted. No mass or hydronephrosis visualized. Left Kidney: Renal measurements: 9.0 cm x 4.6 cm x 3.8 cm = volume: 83 mL. Diffusely increased echogenicity of the renal parenchyma is noted. A 2.0 cm x 2.1 cm x 1.8 cm anechoic structure is seen within the mid left kidney. No abnormal flow is noted within this region on color Doppler evaluation. No hydronephrosis is visualized. Bladder: Appears normal for degree of bladder distention. Other: None. IMPRESSION: 1. Increased renal echogenicity which may be secondary to medical renal disease. 2. Simple cyst within the left kidney. Electronically Signed   By: Virgina Norfolk M.D.   On: 04/09/2021 19:20     Medications:   .  sodium bicarbonate (isotonic) infusion in sterile water 125 mL/hr at 04/10/21 1010   . amLODipine  10 mg Oral Daily  . cholecalciferol  1,000 Units Oral Daily  . [START ON 04/15/2021] cloNIDine  0.2 mg Transdermal Weekly  . heparin  5,000  Units Subcutaneous Q8H  . insulin aspart  0-5 Units Subcutaneous QHS  . insulin aspart  0-9 Units Subcutaneous TID WC  . latanoprost  1 drop Both Eyes QHS  . letrozole  2.5 mg Oral Daily  . levETIRAcetam  500 mg Oral Daily  . levothyroxine  112 mcg Oral Q0600  . metoprolol succinate  50 mg Oral Daily  . [START ON 04/11/2021] pneumococcal 23 valent vaccine  0.5 mL Intramuscular Tomorrow-1000   acetaminophen **OR** acetaminophen, calcium carbonate, camphor-menthol **AND**  hydrOXYzine, docusate sodium, feeding supplement (NEPRO CARB STEADY), ondansetron **OR** ondansetron (ZOFRAN) IV, sorbitol, zolpidem  Assessment/ Plan:  Kirsten Mcmahon is a 75 y.o.  female  with past medical history of GERD, Hypertension, diabetes, hyperlipidemia, CVA and CKD stage 4. She presents to the ED with increased lower extremity edema. She is known to our team from previous admissions.   1. End Stage Renal Disease requiring new start hemodialysis:  - Discussed with patient the continued declining state of her renal function. Dialysis is needed to maintain her life. She agrees to have catheter placed and dialysis started - Spoke with vascular about Cath placement today - NPO for permcath placement - Initiate dialysis after cath placement - Will plan for next treatment tomorrow  2. Anemia of chronic kidney disease  Lab Results  Component Value Date   HGB 6.1 (L) 04/10/2021  Hgb below target  EPO will begin with treatments  3. Secondary Hyperparathyroidism:    Lab Results  Component Value Date   PTH 161 (H) 01/18/2021   CALCIUM 6.2 (LL) 04/10/2021   PHOS 4.5 01/18/2021  Phosphorus within acceptable range  4.Diabetes mellitus type II with chronic kidney disease noninsulin dependent.  Most recent hemoglobin A1c is 5.7 on 01/18/21.  Stable at this time     LOS: 1   5/6/20221:44 PM

## 2021-04-11 DIAGNOSIS — Z992 Dependence on renal dialysis: Secondary | ICD-10-CM

## 2021-04-11 DIAGNOSIS — N186 End stage renal disease: Secondary | ICD-10-CM | POA: Diagnosis not present

## 2021-04-11 DIAGNOSIS — N182 Chronic kidney disease, stage 2 (mild): Secondary | ICD-10-CM | POA: Diagnosis not present

## 2021-04-11 DIAGNOSIS — I10 Essential (primary) hypertension: Secondary | ICD-10-CM | POA: Diagnosis not present

## 2021-04-11 DIAGNOSIS — E1122 Type 2 diabetes mellitus with diabetic chronic kidney disease: Secondary | ICD-10-CM | POA: Diagnosis not present

## 2021-04-11 LAB — GLUCOSE, CAPILLARY
Glucose-Capillary: 98 mg/dL (ref 70–99)
Glucose-Capillary: 112 mg/dL — ABNORMAL HIGH (ref 70–99)
Glucose-Capillary: 124 mg/dL — ABNORMAL HIGH (ref 70–99)
Glucose-Capillary: 105 mg/dL — ABNORMAL HIGH (ref 70–99)

## 2021-04-11 LAB — ABO/RH: ABO/RH(D): A NEG

## 2021-04-11 LAB — HEPATITIS B SURFACE ANTIBODY, QUANTITATIVE: Hep B S AB Quant (Post): <3.1 m[IU]/mL — ABNORMAL LOW (ref >9.9)

## 2021-04-11 LAB — HEMOGLOBIN AND HEMATOCRIT, BLOOD
HCT: 20.4 % — ABNORMAL LOW (ref 36.0–46.0)
Hemoglobin: 6.8 g/dL — ABNORMAL LOW (ref 12.0–15.0)

## 2021-04-11 LAB — PREPARE RBC (CROSSMATCH)

## 2021-04-11 MED ORDER — SODIUM CHLORIDE 0.9% IV SOLUTION: Freq: Once | INTRAVENOUS | Status: DC

## 2021-04-11 NOTE — Progress Notes (Signed)
1 Day Post-Op   Subjective/Chief Complaint: Patient seen for bleeding right IJ dialysis catheter.  She was redressed by dialysis nurse with pressure dressing and no current bleeding is noted.    Objective: Vital signs in last 24 hours: Temp:  [97.4 F (36.3 C)-98.3 F (36.8 C)] 98.2 F (36.8 C) (05/07 1209) Pulse Rate:  [58-96] 88 (05/07 1209) Resp:  [10-22] 16 (05/07 1209) BP: (117-162)/(67-104) 158/99 (05/07 1209) SpO2:  [91 %-100 %] 100 % (05/07 1209)    Intake/Output from previous day: 05/06 0701 - 05/07 0700 In: 1122.9 [I.V.:1122.9] Out: 500  Intake/Output this shift: No intake/output data recorded.  General appearance: alert and cooperative Incision/Wound: Right IJ site is intact with no active bleeding.     Lab Results:  Recent Labs    04/09/21 1545 04/10/21 0753 04/11/21 0758  WBC 10.6* 7.4  --   HGB 8.1* 6.1* 6.8*  HCT 24.8* 18.6* 20.4*  PLT 246 179  --    BMET Recent Labs    04/09/21 1545 04/10/21 0753  NA 137 136  K 4.9 3.9  CL 108 100  CO2 17* 25  GLUCOSE 111* 67*  BUN 79* 71*  CREATININE 7.99* 6.89*  CALCIUM 7.3* 6.2*   PT/INR No results for input(s): LABPROT, INR in the last 72 hours. ABG No results for input(s): PHART, HCO3 in the last 72 hours.  Invalid input(s): PCO2, PO2  Studies/Results: DG Chest 2 View  Result Date: 04/09/2021 CLINICAL DATA:  BILATERAL leg swelling since yesterday, diabetes mellitus, hypertension, history stroke EXAM: CHEST - 2 VIEW COMPARISON:  01/18/2021 FINDINGS: Enlargement of cardiac silhouette. Mild tortuosity and atherosclerotic calcification of thoracic aorta. Mediastinal contours and pulmonary vascularity normal. Lungs clear. No acute infiltrate, pleural effusion, or pneumothorax. Bones demineralized. IMPRESSION: Enlargement of cardiac silhouette. No acute abnormalities. Aortic Atherosclerosis (ICD10-I70.0). Electronically Signed   By: Lavonia Dana M.D.   On: 04/09/2021 16:49   US Renal  Result Date:  04/09/2021 CLINICAL DATA:  Acute kidney injury. EXAM: RENAL / URINARY TRACT ULTRASOUND COMPLETE COMPARISON:  None. FINDINGS: Right Kidney: Renal measurements: 8.4 cm x 3.5 cm x 3.8 cm = volume: 59 mL. Diffusely increased echogenicity of the renal parenchyma is noted. No mass or hydronephrosis visualized. Left Kidney: Renal measurements: 9.0 cm x 4.6 cm x 3.8 cm = volume: 83 mL. Diffusely increased echogenicity of the renal parenchyma is noted. A 2.0 cm x 2.1 cm x 1.8 cm anechoic structure is seen within the mid left kidney. No abnormal flow is noted within this region on color Doppler evaluation. No hydronephrosis is visualized. Bladder: Appears normal for degree of bladder distention. Other: None. IMPRESSION: 1. Increased renal echogenicity which may be secondary to medical renal disease. 2. Simple cyst within the left kidney. Electronically Signed   By: Virgina Norfolk M.D.   On: 04/09/2021 19:20   PERIPHERAL VASCULAR CATHETERIZATION  Result Date: 04/10/2021 See op note  US Venous Img Lower Unilateral Left (DVT)  Result Date: 04/10/2021 CLINICAL DATA:  Two day history of left lower extremity edema EXAM: LEFT LOWER EXTREMITY VENOUS DOPPLER ULTRASOUND TECHNIQUE: Gray-scale sonography with compression, as well as color and duplex ultrasound, were performed to evaluate the deep venous system(s) from the level of the common femoral vein through the popliteal and proximal calf veins. COMPARISON:  None. FINDINGS: VENOUS Normal compressibility of the common femoral, superficial femoral, and popliteal veins, as well as the visualized calf veins. Visualized portions of profunda femoral vein and great saphenous vein unremarkable. No filling defects  to suggest DVT on grayscale or color Doppler imaging. Doppler waveforms show normal direction of venous flow, normal respiratory plasticity and response to augmentation. Limited views of the contralateral common femoral vein are unremarkable. OTHER Superficial subcutaneous  edema in the calf. Limitations: Calf veins are not well seen. IMPRESSION: 1. No evidence of deep venous thrombosis to the level of the knee. Calf veins are not well seen. Electronically Signed   By: Jacqulynn Cadet M.D.   On: 04/10/2021 14:50    Anti-infectives: Anti-infectives (From admission, onward)   Start     Dose/Rate Route Frequency Ordered Stop   04/11/21 0600  ceFAZolin (ANCEF) 1 g in sodium chloride 0.9 % 100 mL IVPB       Note to Pharmacy: To be given in specials   1 g 200 mL/hr over 30 Minutes Intravenous On call to O.R. 04/10/21 1432 04/10/21 1546      Assessment/Plan: s/p Procedure(s): DIALYSIS/PERMA CATHETER INSERTION (N/A) Continue with dressing changes, we will be on standby   LOS: 2 days    Elmore Guise 04/11/2021

## 2021-04-11 NOTE — Progress Notes (Signed)
[place] [facility] [place] NAME: Kirsten Mcmahon    MR#:  [id number]  DATE OF BIRTH:  Nov 22, 1946  SUBJECTIVE:  patient had her first dialysis treatment yesterday. Tolerated reasonably well. According to the RN IJ site losing blood had soaking dressing times two changed overnight.  Dr. Juleen China and vascular Dr Feliberto Gottron informed by RN REVIEW OF SYSTEMS:   Review of Systems  Constitutional: Negative for chills, fever and weight loss.  HENT: Negative for ear discharge, ear pain and nosebleeds.   Eyes: Negative for blurred vision, pain and discharge.  Respiratory: Negative for sputum production, shortness of breath, wheezing and stridor.   Cardiovascular: Positive for leg swelling. Negative for chest pain, palpitations, orthopnea and PND.  Gastrointestinal: Negative for abdominal pain, diarrhea, nausea and vomiting.  Genitourinary: Negative for frequency and urgency.  Musculoskeletal: Negative for back pain and joint pain.  Neurological: Positive for weakness. Negative for sensory change, speech change and focal weakness.  Psychiatric/Behavioral: Negative for depression and hallucinations. The patient is not nervous/anxious.    Tolerating Diet: yes Tolerating PT: pending  DRUG ALLERGIES:   Allergies  Allergen Reactions  . Contrast Media [Iodinated Diagnostic Agents]     Other reaction(s): NO ALLERGY  . Latex     Other reaction(s): NO ALLERGY  . Shellfish-Derived Products     Other reaction(s): NO ALLERGY  . Levemir [Insulin Detemir] Itching    VITALS:  Blood pressure (!) 158/87, pulse 91, temperature 97.9 F (36.6 C), temperature source Oral, resp. rate 15, height 5\' 2"  (1.575 m), weight 71.7 kg, SpO2 99 %.  PHYSICAL EXAMINATION:   Physical Exam  GENERAL:  75 y.o.-year-old patient lying in the bed with no acute distress.Mornid obesity  Pallor+ HEENT: Head atraumatic, normocephalic. Oropharynx and nasopharynx clear.  LUNGS: Normal breath  sounds bilaterally, no wheezing, rales, rhonchi. No use of accessory muscles of respiration.  CARDIOVASCULAR: S1, S2 normal. No murmurs, rubs, or gallops.  ABDOMEN: Soft, nontender, nondistended. Bowel sounds present. No organomegaly or mass.  EXTREMITIES: Left LE swelling decreasing    NEUROLOGIC: left UE chronic weakness for CVA.  PSYCHIATRIC:  patient is alert and oriented x 3.  SKIN: No obvious rash, lesion, or ulcer.   LABORATORY PANEL:  CBC Recent Labs  Lab 04/10/21 0753  WBC 7.4  HGB 6.1*  HCT 18.6*  PLT 179    Chemistries  Recent Labs  Lab 04/10/21 0753  NA 136  K 3.9  CL 100  CO2 25  GLUCOSE 67*  BUN 71*  CREATININE 6.89*  CALCIUM 6.2*  AST 11*  ALT 9  ALKPHOS 53  BILITOT 0.5   Cardiac Enzymes No results for input(s): TROPONINI in the last 168 hours. RADIOLOGY:  DG Chest 2 View  Result Date: 04/09/2021 CLINICAL DATA:  BILATERAL leg swelling since yesterday, diabetes mellitus, hypertension, history stroke EXAM: CHEST - 2 VIEW COMPARISON:  01/18/2021 FINDINGS: Enlargement of cardiac silhouette. Mild tortuosity and atherosclerotic calcification of thoracic aorta. Mediastinal contours and pulmonary vascularity normal. Lungs clear. No acute infiltrate, pleural effusion, or pneumothorax. Bones demineralized. IMPRESSION: Enlargement of cardiac silhouette. No acute abnormalities. Aortic Atherosclerosis (ICD10-I70.0). Electronically Signed   By: Lavonia Dana M.D.   On: 04/09/2021 16:49   US Renal  Result Date: 04/09/2021 CLINICAL DATA:  Acute kidney injury. EXAM: RENAL / URINARY TRACT ULTRASOUND COMPLETE COMPARISON:  None. FINDINGS: Right Kidney: Renal measurements: 8.4 cm x 3.5 cm x 3.8 cm = volume: 59 mL. Diffusely increased echogenicity of the renal  parenchyma is noted. No mass or hydronephrosis visualized. Left Kidney: Renal measurements: 9.0 cm x 4.6 cm x 3.8 cm = volume: 83 mL. Diffusely increased echogenicity of the renal parenchyma is noted. A 2.0 cm x 2.1 cm x 1.8  cm anechoic structure is seen within the mid left kidney. No abnormal flow is noted within this region on color Doppler evaluation. No hydronephrosis is visualized. Bladder: Appears normal for degree of bladder distention. Other: None. IMPRESSION: 1. Increased renal echogenicity which may be secondary to medical renal disease. 2. Simple cyst within the left kidney. Electronically Signed   By: Virgina Norfolk M.D.   On: 04/09/2021 19:20   PERIPHERAL VASCULAR CATHETERIZATION  Result Date: 04/10/2021 See op note  US Venous Img Lower Unilateral Left (DVT)  Result Date: 04/10/2021 CLINICAL DATA:  Two day history of left lower extremity edema EXAM: LEFT LOWER EXTREMITY VENOUS DOPPLER ULTRASOUND TECHNIQUE: Gray-scale sonography with compression, as well as color and duplex ultrasound, were performed to evaluate the deep venous system(s) from the level of the common femoral vein through the popliteal and proximal calf veins. COMPARISON:  None. FINDINGS: VENOUS Normal compressibility of the common femoral, superficial femoral, and popliteal veins, as well as the visualized calf veins. Visualized portions of profunda femoral vein and great saphenous vein unremarkable. No filling defects to suggest DVT on grayscale or color Doppler imaging. Doppler waveforms show normal direction of venous flow, normal respiratory plasticity and response to augmentation. Limited views of the contralateral common femoral vein are unremarkable. OTHER Superficial subcutaneous edema in the calf. Limitations: Calf veins are not well seen. IMPRESSION: 1. No evidence of deep venous thrombosis to the level of the knee. Calf veins are not well seen. Electronically Signed   By: Jacqulynn Cadet M.D.   On: 04/10/2021 14:50   ASSESSMENT AND PLAN:   Kirsten Mcmahon is a 75 y.o. female with medical history significant of chronic kidney disease stage IV, GERD, hypertension, hypothyroidism, non-insulin-dependent diabetes, hyperlipidemia,  previous CVA and overactive bladder who has been seeing nephrology but not yet ready for hemodialysis. She noticed worsening lower extremities edema left more than right.   Acute on chronic chronic kidney disease stage IV--now progressed to to ESRD -- Patient appears to have medical renal disease at this point.  -- Nephrology Dr Juleen China input appreciated. Patient had right IJ HD access place by Dr. Lucky Cowboy. --5/6-- first HD treatment --5/7-- bleeding from vascular site. Vascular on-call aware. Follow recommendations. -- Will check hemoglobin and patient received blood transfusion per Dr. Juleen China at dialysis today  Left Lower extremity edema: Left greater than right.   -- lower extremity Doppler negative for DVT unclear reason for unilateral edema  Essential hypertension: -- amlodipine, metoprolol, clonidine patch --prn hydralazine  anemia of chronic disease: Most likely due to chronic renal disease. -- blood transfusion with dialysis  Type 2 diabetes: Sliding scale insulin. --sugars in the 63--93  Hypothyroidism:  --on levothyroxine.   GERD: PPIs   DVT prophylaxis: Heparin Code Status: Full code Family Communication:  spoke with son [name] on the phone Disposition Plan: TBD Consults called:  nephrology Admission status: Inpatient  Procedures:Right IJ HD access Level of care: Med-Surg  Remains inpatient appropriate because:Inpatient level of care appropriate due to severity of illness   Patient  started on hemodialysis for new diagnosis of end-stage renal disease. PT to see pt  TOC for d/c planning       TOTAL TIME TAKING CARE OF THIS PATIENT: *25* minutes.  >50% time  spent on counselling and coordination of care  Note: This dictation was prepared with Dragon dictation along with smaller phrase technology. Any transcriptional errors that result from this process are unintentional.  Fritzi Mandes M.D    [facility]   CC: Primary care physician;  [facility] IncPatient ID: Kirsten Mcmahon, female   DOB: 11-26-1946, 75 y.o.   MRN: [id number]

## 2021-04-11 NOTE — Progress Notes (Signed)
Patients right IJ access slowly bled through the night. I was also told during dialysis the nurse had to change the dressing because of her bleeding.

## 2021-04-11 NOTE — TOC Initial Note (Signed)
Transition of Care [facility]) - Initial/Assessment Note    Patient Details  Name: Kirsten Mcmahon MRN: [id number] Date of Birth: 01/17/1946  Transition of Care [facility]) CM/SW Contact:    [name], LCSW Phone Number: 04/11/2021, 10:54 AM  Clinical Narrative:                Patient currently getting dialysis and blood transfusion. RN Georgina Peer to have patient reach out to CSW when back in room.  CSW spoke with patient's son Kirsten Mcmahon. He reported patient lives with him at [address], [place], [place] [address]. Kirsten Mcmahon stated patient has a cane and w/c at home. He provides transportation. He reported he does not want patient to go to a SNF, that he feels he can adequately care for patient at home. CSW explained we are waiting on PT/OT evals and that recommendations will be discussed with patient, PACE, and family. Ultimately it is up to patient on DC planning.   CSW also called PACE and spoke with on call RN [name] [phone]). [name] reported patient had been living with her son in [place] and was in the process of switching to [facility]ACE in [place], but this has not happened yet so she is still followed by [facility]. CSW provided update and that patient is new HD. [name] is going to check with their Medical Team to see which Dialysis Centers their patients can use.    Expected Discharge Plan: [place] Barriers to Discharge: Continued Medical Work up   Patient Goals and CMS Choice        Expected Discharge Plan and Services Expected Discharge Plan: [place]       Living arrangements for the past 2 months: Single Family Home                                      Prior Living Arrangements/Services Living arrangements for the past 2 months: Single Family Home Lives with:: Adult Children Patient language and need for interpreter reviewed:: Yes        Need for Family Participation in Patient Care: Yes (Comment) Care giver  support system in place?: Yes (comment) Current home services: DME Criminal Activity/Legal Involvement Pertinent to Current Situation/Hospitalization: No - Comment as needed  Activities of Daily Living Home Assistive Devices/Equipment: Wheelchair ADL Screening (condition at time of admission) Patient's cognitive ability adequate to safely complete daily activities?: Yes Is the patient deaf or have difficulty hearing?: No Does the patient have difficulty seeing, even when wearing glasses/contacts?: No Does the patient have difficulty concentrating, remembering, or making decisions?: No Patient able to express need for assistance with ADLs?: Yes Does the patient have difficulty dressing or bathing?: Yes Independently performs ADLs?: No Communication: Independent Dressing (OT): Needs assistance Is this a change from baseline?: Pre-admission baseline Grooming: Needs assistance Is this a change from baseline?: Pre-admission baseline Feeding: Independent Bathing: Needs assistance Is this a change from baseline?: Pre-admission baseline Toileting: Needs assistance Is this a change from baseline?: Pre-admission baseline In/Out Bed: Needs assistance Is this a change from baseline?: Pre-admission baseline Walks in Home: Dependent Is this a change from baseline?: Pre-admission baseline Does the patient have difficulty walking or climbing stairs?: Yes Weakness of Legs: Left Weakness of Arms/Hands: Left  Permission Sought/Granted Permission sought to share information with : Facility Contact Representative,Family Supports Permission granted to share information with : Yes,  Verbal Permission Granted  Share Information with NAME: Kirsten Mcmahon  Permission granted to share info w AGENCY: [facility]        Emotional Assessment       Orientation: : Oriented to Self,Oriented to Place,Oriented to  Time,Oriented to Situation Alcohol / Substance Use: Not Applicable Psych Involvement: No  (comment)  Admission diagnosis:  Peripheral edema [R60.9] Acute kidney injury ([place]) [N17.9] Acute kidney injury superimposed on CKD ([place]) [N17.9, N18.9] Patient Active Problem List   Diagnosis Date Noted  . ESRD needing dialysis ([place])   . Acute kidney injury superimposed on CKD ([place]) 04/09/2021  . ARF (acute renal failure) ([place]) 01/18/2021  . Fall 01/17/2021  . Hypertension   . Hypothyroidism   . Stroke ([place])   . GERD (gastroesophageal reflux disease)   . Anemia in chronic kidney disease (CKD)   . Acute metabolic encephalopathy   . Acute renal failure superimposed on stage 3b chronic kidney disease ([place])   . Type II diabetes mellitus with renal manifestations ([place])   . Abnormal mammogram of left breast 07/30/2018  . Closed displaced oblique fracture of shaft of left humerus 03/14/2018  . Osteopenia 01/20/2018  . Multiple thyroid nodules 07/13/2017  . Tracheal deviation 07/13/2017  . Malignant neoplasm of upper-outer quadrant of right breast in female, estrogen receptor positive ([place]) 01/20/2017  . Primary vulvar squamous cell carcinoma ([place]) 01/20/2017   PCP:  [facility]:    [place], [place] - [address] [place] [address] Phone: [phone] Fax: [phone]     Social Determinants of Health (SDOH) Interventions    Readmission Risk Interventions Readmission Risk Prevention Plan 04/11/2021  Transportation Screening Complete  PCP or Specialist Appt within 3-5 Days Complete  HRI or [place] Complete  Social Work Consult for [place] Planning/Counseling Complete  Palliative Care Screening Not Applicable  Medication Review Press photographer) Complete  Some recent data might be hidden

## 2021-04-11 NOTE — Progress Notes (Signed)
PT Cancellation Note  Patient Details Name: Kirsten Mcmahon MRN: [id number] DOB: 10-22-1946   Cancelled Treatment:    Reason Eval/Treat Not Completed: Patient not medically ready (Hemoglobin 6.1 this morning and not appropriate for activity. Will re-attempt at later time/date when medically ready.)   [name]. Graylon Good, PT, DPT 04/11/21, 8:19 AM

## 2021-04-11 NOTE — Progress Notes (Signed)
[name]  ROUNDING NOTE   Subjective:   Seen and examined on second hemodialysis treatment. Catheter exit site with significant bleeding. Post treatment bleeding yesterday as well.   PRBC transfusion ordered.   Daughter in patient's room.   Spoke to son Kirsten Mcmahon yesterday. Patient is requesting I speak to him again today.   Objective:  Vital signs in last 24 hours:  Temp:  [97.4 F (36.3 C)-98.3 F (36.8 C)] 98.2 F (36.8 C) (05/07 1209) Pulse Rate:  [58-96] 88 (05/07 1209) Resp:  [10-22] 16 (05/07 1209) BP: (117-162)/(67-104) 158/99 (05/07 1209) SpO2:  [91 %-100 %] 100 % (05/07 1209)  Weight change:  Filed Weights   04/09/21 1543 04/10/21 0500  Weight: 70.8 kg 71.7 kg    Intake/Output: I/O last 3 completed shifts: In: 1591 [I.V.:1591] Out: 500 [Other:500]   Intake/Output this shift:  No intake/output data recorded.  Physical Exam: General: NAD, resting in bed  Head: Normocephalic, atraumatic. Moist oral mucosal membranes  Eyes: Anicteric  Lungs:  Wheeze to auscultation  Heart: Regular rate and rhythm  Abdomen:  Soft, nontender,  distended  Extremities:  3+ peripheral edema.  Neurologic: Drowsy, able to answer simple questions, moving all four extremities  Skin: No lesions  Access: RIJ permcath 5/6 Dr. Lucky Mcmahon, Bleeding at exit site. Pressure dressings applied.     Basic Metabolic Panel: Recent Labs  Lab 04/09/21 1545 04/10/21 0753  NA 137 136  K 4.9 3.9  CL 108 100  CO2 17* 25  GLUCOSE 111* 67*  BUN 79* 71*  CREATININE 7.99* 6.89*  CALCIUM 7.3* 6.2*    Liver Function Tests: Recent Labs  Lab 04/10/21 0753  AST 11*  ALT 9  ALKPHOS 53  BILITOT 0.5  PROT 5.6*  ALBUMIN 2.5*   No results for input(s): LIPASE, AMYLASE in the last 168 hours. No results for input(s): AMMONIA in the last 168 hours.  CBC: Recent Labs  Lab 04/09/21 1545 04/10/21 0753 04/11/21 0758  WBC 10.6* 7.4  --   HGB 8.1* 6.1* 6.8*  HCT 24.8* 18.6* 20.4*   MCV 92.2 90.7  --   PLT 246 179  --     Cardiac Enzymes: No results for input(s): CKTOTAL, CKMB, CKMBINDEX, TROPONINI in the last 168 hours.  BNP: Invalid input(s): POCBNP  CBG: Recent Labs  Lab 04/10/21 0833 04/10/21 1150 04/10/21 2134 04/11/21 0742 04/11/21 1214  GLUCAP 80 93 82 98 112*    Microbiology: Results for orders placed or performed during the hospital encounter of 04/09/21  Resp Panel by RT-PCR (Flu A&B, Covid) Nasopharyngeal Swab     Status: None   Collection Time: 04/09/21  5:58 PM   Specimen: Nasopharyngeal Swab; Nasopharyngeal(NP) swabs in vial transport medium  Result Value Ref Range Status   SARS Coronavirus 2 by RT PCR NEGATIVE NEGATIVE Final    Comment: (NOTE) SARS-CoV-2 target nucleic acids are NOT DETECTED.  The SARS-CoV-2 RNA is generally detectable in upper respiratory specimens during the acute phase of infection. The lowest concentration of SARS-CoV-2 viral copies this assay can detect is 138 copies/mL. A negative result does not preclude SARS-Cov-2 infection and should not be used as the sole basis for treatment or other patient management decisions. A negative result may occur with  improper specimen collection/handling, submission of specimen other than nasopharyngeal swab, presence of viral mutation(s) within the areas targeted by this assay, and inadequate number of viral copies(<138 copies/mL). A negative result must be combined with clinical observations, patient history, and epidemiological  information. The expected result is Negative.  Fact Sheet for Patients:  EntrepreneurPulse.com.au  Fact Sheet for Healthcare Providers:  IncredibleEmployment.be  This test is no t yet approved or cleared by the [place] FDA and  has been authorized for detection and/or diagnosis of SARS-CoV-2 by FDA under an Emergency Use Authorization (EUA). This EUA will remain  in effect (meaning this test can be  used) for the duration of the COVID-19 declaration under Section 564(b)(1) of the Act, 21 U.S.C.section 360bbb-3(b)(1), unless the authorization is terminated  or revoked sooner.       Influenza A by PCR NEGATIVE NEGATIVE Final   Influenza B by PCR NEGATIVE NEGATIVE Final    Comment: (NOTE) The Xpert Xpress SARS-CoV-2/FLU/RSV plus assay is intended as an aid in the diagnosis of influenza from Nasopharyngeal swab specimens and should not be used as a sole basis for treatment. Nasal washings and aspirates are unacceptable for Xpert Xpress SARS-CoV-2/FLU/RSV testing.  Fact Sheet for Patients: EntrepreneurPulse.com.au  Fact Sheet for Healthcare Providers: IncredibleEmployment.be  This test is not yet approved or cleared by the [place] FDA and has been authorized for detection and/or diagnosis of SARS-CoV-2 by FDA under an Emergency Use Authorization (EUA). This EUA will remain in effect (meaning this test can be used) for the duration of the COVID-19 declaration under Section 564(b)(1) of the Act, 21 U.S.C. section 360bbb-3(b)(1), unless the authorization is terminated or revoked.  Performed at [facility], [address] [place], [place] [address]     Coagulation Studies: No results for input(s): LABPROT, INR in the last 72 hours.  Urinalysis: No results for input(s): COLORURINE, LABSPEC, PHURINE, GLUCOSEU, HGBUR, BILIRUBINUR, KETONESUR, PROTEINUR, UROBILINOGEN, NITRITE, LEUKOCYTESUR in the last 72 hours.  Invalid input(s): APPERANCEUR    Imaging: DG Chest 2 View  Result Date: 04/09/2021 CLINICAL DATA:  BILATERAL leg swelling since yesterday, diabetes mellitus, hypertension, history stroke EXAM: CHEST - 2 VIEW COMPARISON:  01/18/2021 FINDINGS: Enlargement of cardiac silhouette. Mild tortuosity and atherosclerotic calcification of thoracic aorta. Mediastinal contours and pulmonary vascularity normal. Lungs clear. No acute  infiltrate, pleural effusion, or pneumothorax. Bones demineralized. IMPRESSION: Enlargement of cardiac silhouette. No acute abnormalities. Aortic Atherosclerosis (ICD10-I70.0). Electronically Signed   By: Kirsten Mcmahon M.D.   On: 04/09/2021 16:49   US Renal  Result Date: 04/09/2021 CLINICAL DATA:  Acute kidney injury. EXAM: RENAL / URINARY TRACT ULTRASOUND COMPLETE COMPARISON:  None. FINDINGS: Right Kidney: Renal measurements: 8.4 cm x 3.5 cm x 3.8 cm = volume: 59 mL. Diffusely increased echogenicity of the renal parenchyma is noted. No mass or hydronephrosis visualized. Left Kidney: Renal measurements: 9.0 cm x 4.6 cm x 3.8 cm = volume: 83 mL. Diffusely increased echogenicity of the renal parenchyma is noted. A 2.0 cm x 2.1 cm x 1.8 cm anechoic structure is seen within the mid left kidney. No abnormal flow is noted within this region on color Doppler evaluation. No hydronephrosis is visualized. Bladder: Appears normal for degree of bladder distention. Other: None. IMPRESSION: 1. Increased renal echogenicity which may be secondary to medical renal disease. 2. Simple cyst within the left kidney. Electronically Signed   By: Virgina Norfolk M.D.   On: 04/09/2021 19:20   PERIPHERAL VASCULAR CATHETERIZATION  Result Date: 04/10/2021 See op note  US Venous Img Lower Unilateral Left (DVT)  Result Date: 04/10/2021 CLINICAL DATA:  Two day history of left lower extremity edema EXAM: LEFT LOWER EXTREMITY VENOUS DOPPLER ULTRASOUND TECHNIQUE: Gray-scale sonography with compression, as well as color and duplex  ultrasound, were performed to evaluate the deep venous system(s) from the level of the common femoral vein through the popliteal and proximal calf veins. COMPARISON:  None. FINDINGS: VENOUS Normal compressibility of the common femoral, superficial femoral, and popliteal veins, as well as the visualized calf veins. Visualized portions of profunda femoral vein and great saphenous vein unremarkable. No filling defects  to suggest DVT on grayscale or color Doppler imaging. Doppler waveforms show normal direction of venous flow, normal respiratory plasticity and response to augmentation. Limited views of the contralateral common femoral vein are unremarkable. OTHER Superficial subcutaneous edema in the calf. Limitations: Calf veins are not well seen. IMPRESSION: 1. No evidence of deep venous thrombosis to the level of the knee. Calf veins are not well seen. Electronically Signed   By: Jacqulynn Cadet M.D.   On: 04/10/2021 14:50     Medications:    . sodium chloride   Intravenous Once  . amLODipine  10 mg Oral Daily  . Chlorhexidine Gluconate Cloth  6 each Topical Q0600  . cholecalciferol  1,000 Units Oral Daily  . [START ON 04/15/2021] cloNIDine  0.2 mg Transdermal Weekly  . heparin  5,000 Units Subcutaneous Q8H  . insulin aspart  0-5 Units Subcutaneous QHS  . insulin aspart  0-9 Units Subcutaneous TID WC  . latanoprost  1 drop Both Eyes QHS  . letrozole  2.5 mg Oral Daily  . levETIRAcetam  500 mg Oral Daily  . levothyroxine  112 mcg Oral Q0600  . metoprolol succinate  50 mg Oral Daily  . pneumococcal 23 valent vaccine  0.5 mL Intramuscular Tomorrow-1000   acetaminophen **OR** acetaminophen, calcium carbonate, camphor-menthol **AND** hydrOXYzine, docusate sodium, feeding supplement (NEPRO CARB STEADY), ondansetron **OR** ondansetron (ZOFRAN) IV, ondansetron (ZOFRAN) IV, sorbitol, zolpidem  Assessment/ Plan:  Ms. EARLENA WERST is a 75 y.o.  female  with past medical history of GERD, Hypertension, diabetes, hyperlipidemia, CVA and CKD stage 4. She presents to the ED with increased lower extremity edema. She is known to our team from previous admissions.   1. End Stage Renal Disease requiring new start hemodialysis:  First dialysis treatment 5/6.  Seen and examined on second hemodialysis treatment.  - Vascular to assist with permcath bleeding.  - Plan on third treatment for Monday. Patient will need  to be done in a chair.  - Outpatient planning: patient was followed by [facility] and [facility]. But family is considering moving her to [place].   2. Anemia of chronic kidney disease  Lab Results  Component Value Date   HGB 6.8 (L) 04/11/2021  Hgb below target  EPO will begin with treatments - PRBC transfusion for today.    3. Secondary Hyperparathyroidism:    Lab Results  Component Value Date   PTH 161 (H) 01/18/2021   CALCIUM 6.2 (LL) 04/10/2021   PHOS 4.5 01/18/2021  Corrected calcium of  7.4 - consistent with ESRD.  Not currently on an vitamin D agent.   4.Diabetes mellitus type II with chronic kidney disease noninsulin dependent.  hemoglobin A1c is 5.7 on 01/18/21.  Stable at this time  5. Hypertension: elevated with volume overload. 158/99. Home regimen of clonidine, amlodipine, metoprolol.  - restart clonidine.      LOS: 2 [facility] 5/7/202212:24 PM

## 2021-04-11 NOTE — Progress Notes (Signed)
[facility]  Pt R IJ dialysis catheter noted to have gauze dressing saturated with blood. Dr Juleen China informed. Dressing changed using surgicel and pressure dressing. Vascular MD came to access site but bleeding had already resolved. Pt sent back to floor with pressure dressing on and instructions for floor nurse to monitor site and inform MD if bleeding restarts.   Pt ordered 1 unit of PRBC during dialysis. Floor nurse stated that type and cross has not been done. Informed Dr Juleen China.    Elberta Leatherwood

## 2021-04-11 NOTE — Progress Notes (Signed)
OT Cancellation Note  Patient Details Name: Kirsten Mcmahon MRN: [id number] DOB: 1946-09-14   Cancelled Treatment:    Reason Eval/Treat Not Completed: Medical issues which prohibited therapy. OT order received and chart reviewed. Pt's hemoglobin of 6.1 is contraindicated for OT intervention. OT to follow and re-attempt when pt is next able to participate.   Darleen Crocker, MS, OTR/L , CBIS ascom [phone]  04/11/21, 8:15 AM  04/11/2021, 8:15 AM

## 2021-04-12 DIAGNOSIS — E1122 Type 2 diabetes mellitus with diabetic chronic kidney disease: Secondary | ICD-10-CM | POA: Diagnosis not present

## 2021-04-12 DIAGNOSIS — N182 Chronic kidney disease, stage 2 (mild): Secondary | ICD-10-CM | POA: Diagnosis not present

## 2021-04-12 DIAGNOSIS — I10 Essential (primary) hypertension: Secondary | ICD-10-CM | POA: Diagnosis not present

## 2021-04-12 DIAGNOSIS — N186 End stage renal disease: Secondary | ICD-10-CM | POA: Diagnosis not present

## 2021-04-12 LAB — TYPE AND SCREEN
ABO/RH(D): A NEG
Antibody Screen: NEGATIVE
Unit division: 0

## 2021-04-12 LAB — GLUCOSE, CAPILLARY
Glucose-Capillary: 74 mg/dL (ref 70–99)
Glucose-Capillary: 120 mg/dL — ABNORMAL HIGH (ref 70–99)
Glucose-Capillary: 158 mg/dL — ABNORMAL HIGH (ref 70–99)
Glucose-Capillary: 94 mg/dL (ref 70–99)

## 2021-04-12 LAB — BPAM RBC
Blood Product Expiration Date: 202205202359
ISSUE DATE / TIME: 202205071607
Unit Type and Rh: 600

## 2021-04-12 LAB — HEPATITIS B E ANTIGEN: Hep B E Ag: NEGATIVE

## 2021-04-12 MED ORDER — HALOPERIDOL LACTATE 5 MG/ML IJ SOLN: 2.0000 mg | Freq: Once | INTRAMUSCULAR | Status: DC

## 2021-04-12 MED ORDER — LORAZEPAM 2 MG/ML IJ SOLN
2.0000 mg | Freq: Once | INTRAMUSCULAR | Status: AC
  Administered 2021-04-12: 2 mg via INTRAVENOUS
  Filled 2021-04-12: qty 1

## 2021-04-12 NOTE — TOC Progression Note (Addendum)
Transition of Care [facility]) - Progression Note    Patient Details  Name: Kirsten Mcmahon MRN: [id number] Date of Birth: January 23, 1946  Transition of Care [facility]) CM/SW [place], LCSW Phone Number: 04/12/2021, 1:38 PM  Clinical Narrative:   Per notes, patient is only oriented to self and place. Reached out to son [name]. Left a VM informing him of HHPT recommendation. Called on call [facility] RN [name] and provided update. She reported they would set up PT at their Center and son would have to transport patient since she is now living outside of their service area. She reported the HD Centers they use are [facility] or [facility]. She reported son would have to transport patient to this as well. However son has been unreliable with picking patient up from their center on time so this may be a concern. She reported their medical team will follow up with TOC and patient's son about this tomorrow.   [facility] Representatives for tomorrow are Donata Clay (RN) or Lauralyn Primes (NP).  1:57- Call from patient's son, provided update. He verbalized understanding about patient needing PT and dialysis, and stated there would be no issues with him providing transport.  Expected Discharge Plan: [place] Barriers to Discharge: Continued Medical Work up  Expected Discharge Plan and Services Expected Discharge Plan: [place] arrangements for the past 2 months: Single Family Home                                       Social Determinants of Health (SDOH) Interventions    Readmission Risk Interventions Readmission Risk Prevention Plan 04/11/2021  Transportation Screening Complete  PCP or Specialist Appt within 3-5 Days Complete  HRI or [place] Complete  Social Work Consult for [place] Planning/Counseling Complete  Palliative Care Screening Not Applicable  Medication Review Press photographer) Complete  Some recent data might be  hidden

## 2021-04-12 NOTE — Evaluation (Signed)
Physical Therapy Evaluation Patient Details Name: Kirsten Mcmahon MRN: [id number] DOB: 08-09-1946 Today's Date: 04/12/2021   History of Present Illness  presented to ER secondary to fluid overload; admitted for management of acute/chronic CKD (stage IV).  Hopsital course significant for R perm-cath placement (5/6).  Clinical Impression  Patient sleeping in bed upon arrival; easily awakens to voice.  Oriented to self only; follows simple commands, pleasant and cooperative throughout session.  Speech clear and intelligible, but often non-sensical and off-topic (slightly perseverative) at times.  Dense L UE/LE hemiplegia noted, baseline for patient (history of CVA, craniectomy?).  Currently able to complete bed mobility with mod assist; unsupported sitting balance with min assist; sit/stand and SPT without assist device, mod assist. Maintains weight solely on R LE and requires R UE for external stabilization; unable to take formal step with R LE, tends to pivot on foot once upright.  Optimal performance likely achieved with SPT towards R due to suspected L hemimonymous hemianopsia.  Additional gait not tested secondary to [facility] level as primary mobility. Performance appears similar to status on previous hospital admissions; likely near baseline for patient. Would benefit from skilled PT to address above deficits and promote optimal return to PLOF.; Recommend transition to HHPT with continued support from [facility] program upon discharge from acute hospitalization.     Follow Up Recommendations Home health PT (continued support from [facility] program)    Equipment Recommendations       Recommendations for Other Services       Precautions / Restrictions Precautions Precautions: Fall Precaution Comments: R perm-cath; history of R fronto-temporal craniectomy with musculocutaneous flap over site Restrictions Weight Bearing Restrictions: No      Mobility  Bed Mobility Overal bed mobility: Needs  Assistance Bed Mobility: Supine to Sit     Supine to sit: Mod assist          Transfers Overall transfer level: Needs assistance   Transfers: Sit to/from Stand;Stand Pivot Transfers Sit to Stand: Mod assist         General transfer comment: maintains weight solely on R LE and requires R UE for external stabilization; unable to take formal step with R LE, tends to pivot on foot once upright  Ambulation/Gait             General Gait Details: unsafe/unable; non-ambulatory at baseline  Stairs            Wheelchair Mobility    Modified Rankin (Stroke Patients Only)       Balance Overall balance assessment: Needs assistance Sitting-balance support: No upper extremity supported;Feet supported Sitting balance-[name] Scale: Fair Sitting balance - Comments: min assist for initial sitting balance due to posterior lean, improved to close sup with forward weight shifting activities                                     Pertinent Vitals/Pain Pain Assessment: No/denies pain    Home Living Family/patient expects to be discharged to:: Private residence Living Arrangements: Children Available Help at Discharge: Available PRN/intermittently;Family Type of Home: House Home Access: Ramped entrance     Home Layout: One level Home Equipment: Shower seat;Wheelchair - manual Additional Comments: Patient limited historian; information obtained from the chart    Prior Function Level of Independence: Needs assistance         Comments: Patient limited historian; information obtained from chart.  WC level as primary mobility, assist  for ADLs and transfers between seating surfaces.  Lives with son and receives support services from [facility].     Hand Dominance        Extremity/Trunk Assessment   Upper Extremity Assessment Upper Extremity Assessment:  (dense L hemiplegia (prior CVA) with significant ROM limitations throughout L UE; moderate edema,  mild posturing L UE. R UE grossly WFL)    Lower Extremity Assessment Lower Extremity Assessment:  (L LE grossly hemiplegic, limited L knee flexion noted (tends to maintain in extended, NWB position in unsupported sitting).  R LE grossly WFL)    Cervical / Trunk Assessment Cervical / Trunk Assessment:  (gaze preference to R, but does attend to midline and slightly beyond)  Communication   Communication:  (clear and intelligible, but often non-sensical and off-topic in spech)  Cognition Arousal/Alertness: Awake/alert Behavior During Therapy: WFL for tasks assessed/performed Overall Cognitive Status: No family/caregiver present to determine baseline cognitive functioning                                 General Comments: oriented to self only; follows commands, pleasant and cooperative.  Generally perseverative in speech at times, often off-topic and non-sensical      General Comments      Exercises Other Exercises Other Exercises: Unsupported sitting, participated with dynamic reaching activities to promote anterior weight translation, forward trunk lean.  Limited flexibility of lumbar spine Other Exercises: Appears to have L hemimonymous hemianopsia; very limited visual attention/awareness of L visual field.   Assessment/Plan    PT Assessment Patient needs continued PT services  PT Problem List Decreased strength;Decreased range of motion;Decreased activity tolerance;Decreased balance;Decreased mobility;Decreased coordination;Decreased cognition;Decreased knowledge of use of DME;Decreased safety awareness;Decreased knowledge of precautions       PT Treatment Interventions DME instruction;Gait training;Functional mobility training;Therapeutic activities;Therapeutic exercise;Balance training;Patient/family education    PT Goals (Current goals can be found in the Care Plan section)  Acute Rehab PT Goals Patient Stated Goal: agreeable to session PT Goal Formulation:  With patient Time For Goal Achievement: 04/26/21 Potential to Achieve Goals: Fair    Frequency Min 2X/week   Barriers to discharge        Co-evaluation               AM-PAC PT "6 Clicks" Mobility  Outcome Measure Help needed turning from your back to your side while in a flat bed without using bedrails?: A Lot Help needed moving from lying on your back to sitting on the side of a flat bed without using bedrails?: A Lot Help needed moving to and from a bed to a chair (including a wheelchair)?: A Lot Help needed standing up from a chair using your arms (e.g., wheelchair or bedside chair)?: A Lot Help needed to walk in hospital room?: Total Help needed climbing 3-5 steps with a railing? : Total 6 Click Score: 10    End of Session Equipment Utilized During Treatment: Gait belt Activity Tolerance: Patient tolerated treatment well Patient left: in chair;with call bell/phone within reach;with chair alarm set Nurse Communication: Mobility status PT Visit Diagnosis: Muscle weakness (generalized) (M62.81);Difficulty in walking, not elsewhere classified (R26.2);Hemiplegia and hemiparesis Hemiplegia - Right/Left: Left Hemiplegia - dominant/non-dominant: Non-dominant Hemiplegia - caused by: Cerebral infarction    Time: 6073-7106 PT Time Calculation (min) (ACUTE ONLY): 22 min   Charges:   PT Evaluation $PT Eval Moderate Complexity: 1 Mod PT Treatments $Therapeutic Activity: 8-22 mins  Hollan Philipp H. Owens Shark, PT, DPT, NCS 04/12/21, 11:51 AM [phone]

## 2021-04-12 NOTE — Progress Notes (Signed)
[name]  ROUNDING NOTE   Subjective:   Completed her second hemodialysis treatment yesterday. Tolerated treatment well. UF of 2 liters.  Patient states she is doing much better.   Objective:  Vital signs in last 24 hours:  Temp:  [97.7 F (36.5 C)-98.2 F (36.8 C)] 98.2 F (36.8 C) (05/08 1112) Pulse Rate:  [82-90] 90 (05/08 1112) Resp:  [16-18] 18 (05/08 1112) BP: (138-158)/(71-99) 157/82 (05/08 1112) SpO2:  [97 %-100 %] 100 % (05/08 1112)  Weight change:  Filed Weights   04/09/21 1543 04/10/21 0500  Weight: 70.8 kg 71.7 kg    Intake/Output: I/O last 3 completed shifts: In: 676 [P.O.:120; Blood:556] Out: 2900 [Urine:400; Other:2500]   Intake/Output this shift:  Total I/O In: 240 [P.O.:240] Out: -   Physical Exam: General: NAD, sitting in chair  Head: Normocephalic, atraumatic. Moist oral mucosal membranes  Eyes: Anicteric  Lungs:  Clear   Heart: Regular rate and rhythm  Abdomen:  Soft, nontender,  distended  Extremities:  + peripheral edema.  Neurologic: Alert and oriented  Skin: No lesions  Access: RIJ permcath 5/6 Dr. Lucky Cowboy, Bleeding at exit site. Pressure dressings applied.     Basic Metabolic Panel: Recent Labs  Lab 04/09/21 1545 04/10/21 0753  NA 137 136  K 4.9 3.9  CL 108 100  CO2 17* 25  GLUCOSE 111* 67*  BUN 79* 71*  CREATININE 7.99* 6.89*  CALCIUM 7.3* 6.2*    Liver Function Tests: Recent Labs  Lab 04/10/21 0753  AST 11*  ALT 9  ALKPHOS 53  BILITOT 0.5  PROT 5.6*  ALBUMIN 2.5*   No results for input(s): LIPASE, AMYLASE in the last 168 hours. No results for input(s): AMMONIA in the last 168 hours.  CBC: Recent Labs  Lab 04/09/21 1545 04/10/21 0753 04/11/21 0758  WBC 10.6* 7.4  --   HGB 8.1* 6.1* 6.8*  HCT 24.8* 18.6* 20.4*  MCV 92.2 90.7  --   PLT 246 179  --     Cardiac Enzymes: No results for input(s): CKTOTAL, CKMB, CKMBINDEX, TROPONINI in the last 168 hours.  BNP: Invalid input(s):  POCBNP  CBG: Recent Labs  Lab 04/11/21 1214 04/11/21 1654 04/11/21 2118 04/12/21 0752 04/12/21 1114  GLUCAP 112* 124* 105* 74 120*    Microbiology: Results for orders placed or performed during the hospital encounter of 04/09/21  Resp Panel by RT-PCR (Flu A&B, Covid) Nasopharyngeal Swab     Status: None   Collection Time: 04/09/21  5:58 PM   Specimen: Nasopharyngeal Swab; Nasopharyngeal(NP) swabs in vial transport medium  Result Value Ref Range Status   SARS Coronavirus 2 by RT PCR NEGATIVE NEGATIVE Final    Comment: (NOTE) SARS-CoV-2 target nucleic acids are NOT DETECTED.  The SARS-CoV-2 RNA is generally detectable in upper respiratory specimens during the acute phase of infection. The lowest concentration of SARS-CoV-2 viral copies this assay can detect is 138 copies/mL. A negative result does not preclude SARS-Cov-2 infection and should not be used as the sole basis for treatment or other patient management decisions. A negative result may occur with  improper specimen collection/handling, submission of specimen other than nasopharyngeal swab, presence of viral mutation(s) within the areas targeted by this assay, and inadequate number of viral copies(<138 copies/mL). A negative result must be combined with clinical observations, patient history, and epidemiological information. The expected result is Negative.  Fact Sheet for Patients:  EntrepreneurPulse.com.au  Fact Sheet for Healthcare Providers:  IncredibleEmployment.be  This test is no t  yet approved or cleared by the [place] and  has been authorized for detection and/or diagnosis of SARS-CoV-2 by FDA under an Emergency Use Authorization (EUA). This EUA will remain  in effect (meaning this test can be used) for the duration of the COVID-19 declaration under Section 564(b)(1) of the Act, 21 U.S.C.section 360bbb-3(b)(1), unless the authorization is terminated  or  revoked sooner.       Influenza A by PCR NEGATIVE NEGATIVE Final   Influenza B by PCR NEGATIVE NEGATIVE Final    Comment: (NOTE) The Xpert Xpress SARS-CoV-2/FLU/RSV plus assay is intended as an aid in the diagnosis of influenza from Nasopharyngeal swab specimens and should not be used as a sole basis for treatment. Nasal washings and aspirates are unacceptable for Xpert Xpress SARS-CoV-2/FLU/RSV testing.  Fact Sheet for Patients: EntrepreneurPulse.com.au  Fact Sheet for Healthcare Providers: IncredibleEmployment.be  This test is not yet approved or cleared by the [place] FDA and has been authorized for detection and/or diagnosis of SARS-CoV-2 by FDA under an Emergency Use Authorization (EUA). This EUA will remain in effect (meaning this test can be used) for the duration of the COVID-19 declaration under Section 564(b)(1) of the Act, 21 U.S.C. section 360bbb-3(b)(1), unless the authorization is terminated or revoked.  Performed at [facility], [address] [place], [place] [address]     Coagulation Studies: No results for input(s): LABPROT, INR in the last 72 hours.  Urinalysis: No results for input(s): COLORURINE, LABSPEC, PHURINE, GLUCOSEU, HGBUR, BILIRUBINUR, KETONESUR, PROTEINUR, UROBILINOGEN, NITRITE, LEUKOCYTESUR in the last 72 hours.  Invalid input(s): APPERANCEUR    Imaging: PERIPHERAL VASCULAR CATHETERIZATION  Result Date: 04/10/2021 See op note  US Venous Img Lower Unilateral Left (DVT)  Result Date: 04/10/2021 CLINICAL DATA:  Two day history of left lower extremity edema EXAM: LEFT LOWER EXTREMITY VENOUS DOPPLER ULTRASOUND TECHNIQUE: Gray-scale sonography with compression, as well as color and duplex ultrasound, were performed to evaluate the deep venous system(s) from the level of the common femoral vein through the popliteal and proximal calf veins. COMPARISON:  None. FINDINGS: VENOUS Normal  compressibility of the common femoral, superficial femoral, and popliteal veins, as well as the visualized calf veins. Visualized portions of profunda femoral vein and great saphenous vein unremarkable. No filling defects to suggest DVT on grayscale or color Doppler imaging. Doppler waveforms show normal direction of venous flow, normal respiratory plasticity and response to augmentation. Limited views of the contralateral common femoral vein are unremarkable. OTHER Superficial subcutaneous edema in the calf. Limitations: Calf veins are not well seen. IMPRESSION: 1. No evidence of deep venous thrombosis to the level of the knee. Calf veins are not well seen. Electronically Signed   By: Jacqulynn Cadet M.D.   On: 04/10/2021 14:50     Medications:    . sodium chloride   Intravenous Once  . amLODipine  10 mg Oral Daily  . Chlorhexidine Gluconate Cloth  6 each Topical Q0600  . cholecalciferol  1,000 Units Oral Daily  . [START ON 04/15/2021] cloNIDine  0.2 mg Transdermal Weekly  . heparin  5,000 Units Subcutaneous Q8H  . insulin aspart  0-5 Units Subcutaneous QHS  . insulin aspart  0-9 Units Subcutaneous TID WC  . latanoprost  1 drop Both Eyes QHS  . letrozole  2.5 mg Oral Daily  . levETIRAcetam  500 mg Oral Daily  . levothyroxine  112 mcg Oral Q0600  . metoprolol succinate  50 mg Oral Daily  . pneumococcal 23 valent vaccine  0.5 mL Intramuscular Tomorrow-1000   acetaminophen **OR** acetaminophen, calcium carbonate, camphor-menthol **AND** hydrOXYzine, docusate sodium, feeding supplement (NEPRO CARB STEADY), ondansetron **OR** ondansetron (ZOFRAN) IV, ondansetron (ZOFRAN) IV, sorbitol, zolpidem  Assessment/ Plan:  [name] is a 75 y.o.  female  with past medical history of GERD, Hypertension, diabetes, hyperlipidemia, CVA and CKD stage 4. She presents to the ED with increased lower extremity edema. She is known to our team from previous admissions.   1. End Stage Renal Disease  requiring new start hemodialysis:  First dialysis treatment 5/6.  Seen and examined on second hemodialysis treatment.  - Vascular to assist with permcath bleeding.  - Plan on third treatment for Monday. Patient will need to be done in a chair.  - Outpatient planning: patient was followed by [facility] and [facility]. But family is considering moving her to [place].   2. Anemia of chronic kidney disease  Lab Results  Component Value Date   HGB 6.8 (L) 04/11/2021  Hgb below target  EPO will begin with treatments - PRBC transfusion yesterday 5/8.    3. Secondary Hyperparathyroidism: with hypocalcemia   Lab Results  Component Value Date   PTH 161 (H) 01/18/2021   CALCIUM 6.2 (LL) 04/10/2021   PHOS 4.5 01/18/2021   Not currently on an vitamin D agent.   4.Diabetes mellitus type II with chronic kidney disease noninsulin dependent.  hemoglobin A1c is 5.7 on 01/18/21.  Stable at this time  5. Hypertension: elevated with volume overload. Home regimen of clonidine, amlodipine, metoprolol.    LOS: 3 Hali Balgobin 5/8/202211:52 AM

## 2021-04-12 NOTE — Progress Notes (Signed)
Patient ID: Kirsten Mcmahon, female   DOB: January 26, 1946, 75 y.o.   MRN: [id number] Called to assess pt--pt out int he chair. Pt telling staff thief and give her stuff back. Appears delirious. Vitals stable D/w Dr Juleen China ?HD related disequilirbirum. Will give IV ativan 2 mg x1. Left message for son [name].

## 2021-04-12 NOTE — Progress Notes (Signed)
[facility] NAME: Kirsten Mcmahon    MR#:  [id number]  DATE OF BIRTH:  Dec 02, 1946  SUBJECTIVE:   Out int he chair after working with PT earlier. Right IJ HD cath site not bleeding. Got 1 unit BT yday REVIEW OF SYSTEMS:   Review of Systems  Constitutional: Negative for chills, fever and weight loss.  HENT: Negative for ear discharge, ear pain and nosebleeds.   Eyes: Negative for blurred vision, pain and discharge.  Respiratory: Negative for sputum production, shortness of breath, wheezing and stridor.   Cardiovascular: Negative for chest pain, palpitations, orthopnea and PND.  Gastrointestinal: Negative for abdominal pain, diarrhea, nausea and vomiting.  Genitourinary: Negative for frequency and urgency.  Musculoskeletal: Negative for back pain and joint pain.  Neurological: Positive for weakness. Negative for sensory change, speech change and focal weakness.  Psychiatric/Behavioral: Negative for depression and hallucinations. The patient is not nervous/anxious.    Tolerating Diet: yes Tolerating PT: HHPT  DRUG ALLERGIES:   Allergies  Allergen Reactions  . Contrast Media [Iodinated Diagnostic Agents]     Other reaction(s): NO ALLERGY  . Latex     Other reaction(s): NO ALLERGY  . Shellfish-Derived Products     Other reaction(s): NO ALLERGY  . Levemir [Insulin Detemir] Itching    VITALS:  Blood pressure (!) 157/82, pulse 90, temperature 98.2 F (36.8 C), resp. rate 18, height 5\' 2"  (1.575 m), weight 71.7 kg, SpO2 100 %.  PHYSICAL EXAMINATION:   Physical Exam  GENERAL:  75 y.o.-year-old patient lying in the bed with no acute distress.Mornid obesity  Pallor+ HEENT: Head atraumatic, normocephalic. Oropharynx and nasopharynx clear.  Right IJ cath + LUNGS: Normal breath sounds bilaterally, no wheezing, rales, rhonchi. No use of accessory muscles of respiration.  CARDIOVASCULAR: S1, S2 normal. No murmurs, rubs, or gallops.   ABDOMEN: Soft, nontender, nondistended. Bowel sounds present. No organomegaly or mass.  EXTREMITIES: Left LE swelling decreasing    NEUROLOGIC: left UE chronic weakness for CVA.  PSYCHIATRIC:  patient is alert and oriented x 3.  SKIN: No obvious rash, lesion, or ulcer.   LABORATORY PANEL:  CBC Recent Labs  Lab 04/10/21 0753 04/11/21 0758  WBC 7.4  --   HGB 6.1* 6.8*  HCT 18.6* 20.4*  PLT 179  --     Chemistries  Recent Labs  Lab 04/10/21 0753  NA 136  K 3.9  CL 100  CO2 25  GLUCOSE 67*  BUN 71*  CREATININE 6.89*  CALCIUM 6.2*  AST 11*  ALT 9  ALKPHOS 53  BILITOT 0.5   Cardiac Enzymes No results for input(s): TROPONINI in the last 168 hours. RADIOLOGY:  PERIPHERAL VASCULAR CATHETERIZATION  Result Date: 04/10/2021 See op note  ASSESSMENT AND PLAN:   Kirsten Mcmahon is a 75 y.o. female with medical history significant of chronic kidney disease stage IV, GERD, hypertension, hypothyroidism, non-insulin-dependent diabetes, hyperlipidemia, previous CVA and overactive bladder who has been seeing nephrology but not yet ready for hemodialysis. She noticed worsening lower extremities edema left more than right.   Acute on chronic chronic kidney disease stage IV--now progressed to to ESRD -- Patient appears to have medical renal disease at this point.  -- Nephrology Dr Juleen China input appreciated. Patient had right IJ HD access place by Dr. Lucky Cowboy. --5/6-- first HD treatment --5/7-- bleeding from vascular site. Vascular on-call aware. Follow recommendations. -5/8 worked with PT, right IJ cath site no bleeding  Left Lower extremity edema:  Left greater than right.   -- lower extremity Doppler negative for DVT unclear reason for unilateral edema  Essential hypertension: -- amlodipine, metoprolol, clonidine patch --prn hydralazine  anemia of chronic disease: Most likely due to chronic renal disease. -- 5/7-- got 1 unit blood transfusion with dialysis  --5/8--cbc for  am  Type 2 diabetes: Sliding scale insulin. --sugars in the 63--93  Hypothyroidism:  --on levothyroxine.   GERD: PPIs   DVT prophylaxis: Heparin Code Status: Full code Family Communication:  spoke with son [name] on the phone 5/6 Disposition Plan: TBD Consults called:  nephrology Admission status: Inpatient  Procedures:Right IJ HD access Level of care: Med-Surg  Remains inpatient appropriate because:Inpatient level of care appropriate due to severity of illness   Patient  started on hemodialysis for new diagnosis of end-stage renal disease. PT to see pt  TOC for d/c planning       TOTAL TIME TAKING CARE OF THIS PATIENT: *25* minutes.  >50% time spent on counselling and coordination of care  Note: This dictation was prepared with Dragon dictation along with smaller phrase technology. Any transcriptional errors that result from this process are unintentional.  Fritzi Mandes M.D    [facility]   CC: Primary care physician; [facility], IncPatient ID: Kirsten Mcmahon, female   DOB: 29-Apr-1946, 75 y.o.   MRN: [id number]

## 2021-04-12 NOTE — Progress Notes (Signed)
OT Cancellation Note  Patient Details Name: Kirsten Mcmahon MRN: [id number] DOB: 1946/02/27   Cancelled Treatment:    Reason Eval/Treat Not Completed: Patient's level of consciousness  OT orderPt agitated and RN currently preparing to administer Ativan. Will hold OT evaluation at this time and f/u at later date/time as able/appropriate. Thank you.  Gerrianne Scale, [place], OTR/L ascom [phone] 04/12/21, 3:38 PM

## 2021-04-13 ENCOUNTER — Encounter: Payer: Self-pay | Admitting: Vascular Surgery

## 2021-04-13 DIAGNOSIS — E1122 Type 2 diabetes mellitus with diabetic chronic kidney disease: Secondary | ICD-10-CM | POA: Diagnosis not present

## 2021-04-13 DIAGNOSIS — R410 Disorientation, unspecified: Secondary | ICD-10-CM

## 2021-04-13 DIAGNOSIS — I10 Essential (primary) hypertension: Secondary | ICD-10-CM | POA: Diagnosis not present

## 2021-04-13 DIAGNOSIS — N182 Chronic kidney disease, stage 2 (mild): Secondary | ICD-10-CM | POA: Diagnosis not present

## 2021-04-13 DIAGNOSIS — N186 End stage renal disease: Secondary | ICD-10-CM | POA: Diagnosis not present

## 2021-04-13 LAB — GLUCOSE, CAPILLARY
Glucose-Capillary: 115 mg/dL — ABNORMAL HIGH (ref 70–99)
Glucose-Capillary: 93 mg/dL (ref 70–99)
Glucose-Capillary: 127 mg/dL — ABNORMAL HIGH (ref 70–99)

## 2021-04-13 LAB — RENAL FUNCTION PANEL
Albumin: 3.2 g/dL — ABNORMAL LOW (ref 3.5–5.0)
Anion gap: 12 (ref 5–15)
BUN: 13 mg/dL (ref 8–23)
CO2: 27 mmol/L (ref 22–32)
Calcium: 8.3 mg/dL — ABNORMAL LOW (ref 8.9–10.3)
Chloride: 99 mmol/L (ref 98–111)
Creatinine, Ser: 2.02 mg/dL — ABNORMAL HIGH (ref 0.44–1.00)
GFR, Estimated: 25 mL/min — ABNORMAL LOW (ref >60)
Glucose, Bld: 101 mg/dL — ABNORMAL HIGH (ref 70–99)
Phosphorus: 2.2 mg/dL — ABNORMAL LOW (ref 2.5–4.6)
Potassium: 2.9 mmol/L — ABNORMAL LOW (ref 3.5–5.1)
Sodium: 138 mmol/L (ref 135–145)

## 2021-04-13 LAB — QUANTIFERON-TB GOLD PLUS (RQFGPL)
QuantiFERON Mitogen Value: 3.71 IU/mL
QuantiFERON Nil Value: 0.01 IU/mL
QuantiFERON TB1 Ag Value: 0 IU/mL
QuantiFERON TB2 Ag Value: 0.08 IU/mL

## 2021-04-13 LAB — CBC
HCT: 27.1 % — ABNORMAL LOW (ref 36.0–46.0)
Hemoglobin: 9.1 g/dL — ABNORMAL LOW (ref 12.0–15.0)
MCH: 28.8 pg (ref 26.0–34.0)
MCHC: 33.6 g/dL (ref 30.0–36.0)
MCV: 85.8 fL (ref 80.0–100.0)
Platelets: 202 10*3/uL (ref 150–400)
RBC: 3.16 MIL/uL — ABNORMAL LOW (ref 3.87–5.11)
RDW: 16.8 % — ABNORMAL HIGH (ref 11.5–15.5)
WBC: 15.1 10*3/uL — ABNORMAL HIGH (ref 4.0–10.5)
nRBC: 0 % (ref 0.0–0.2)

## 2021-04-13 LAB — QUANTIFERON-TB GOLD PLUS: QuantiFERON-TB Gold Plus: NEGATIVE

## 2021-04-13 MED ORDER — LIDOCAINE HCL (PF) 1 % IJ SOLN
5.0000 mL | INTRAMUSCULAR | Status: DC | PRN
  Filled 2021-04-13: qty 5

## 2021-04-13 MED ORDER — LIDOCAINE-PRILOCAINE 2.5-2.5 % EX CREA
1.0000 "application " | TOPICAL_CREAM | CUTANEOUS | Status: DC | PRN
  Filled 2021-04-13: qty 5

## 2021-04-13 MED ORDER — HYDRALAZINE HCL 20 MG/ML IJ SOLN
10.0000 mg | Freq: Four times a day (QID) | INTRAMUSCULAR | Status: DC | PRN
  Administered 2021-04-15: 10 mg via INTRAVENOUS
  Filled 2021-04-13: qty 1

## 2021-04-13 MED ORDER — ALTEPLASE 2 MG IJ SOLR: 2.0000 mg | Freq: Once | INTRAMUSCULAR | Status: DC | PRN

## 2021-04-13 MED ORDER — HEPARIN SODIUM (PORCINE) 1000 UNIT/ML DIALYSIS: 1000.0000 [IU] | INTRAMUSCULAR | Status: DC | PRN

## 2021-04-13 MED ORDER — SODIUM CHLORIDE 0.9 % IV SOLN: 100.0000 mL | INTRAVENOUS | Status: DC | PRN

## 2021-04-13 MED ORDER — LORAZEPAM 2 MG/ML IJ SOLN
1.0000 mg | Freq: Two times a day (BID) | INTRAMUSCULAR | Status: DC | PRN
  Filled 2021-04-13: qty 0.5

## 2021-04-13 MED ORDER — PENTAFLUOROPROP-TETRAFLUOROETH EX AERO
1.0000 "application " | INHALATION_SPRAY | CUTANEOUS | Status: DC | PRN
  Filled 2021-04-13: qty 30

## 2021-04-13 NOTE — Progress Notes (Signed)
   04/13/21 1100 04/13/21 1115 04/13/21 1130  Vitals  BP (!) 158/78 135/84 (!) 156/94  MAP (mmHg) 102 101 106  Pulse Rate 91 94 98  Pulse Rate Source  --  Monitor  --   ECG Heart Rate 87 91 (!) 106  Resp 15 20 (!) 28  During Hemodialysis Assessment  Blood Flow Rate (mL/min) 300 mL/min 300 mL/min 300 mL/min  Arterial Pressure (mmHg) -150 mmHg -180 mmHg -150 mmHg  Venous Pressure (mmHg) 100 mmHg 100 mmHg 110 mmHg  Transmembrane Pressure (mmHg) 50 mmHg 60 mmHg 50 mmHg  Ultrafiltration Rate (mL/min) 840 mL/min 840 mL/min 840 mL/min  Dialysate Flow Rate (mL/min) 600 ml/min 600 ml/min 600 ml/min  Conductivity: Machine  14 14.1 14.1  HD Safety Checks Performed Yes Yes Yes  Dialysate Change 2K 2K 2K  Intra-Hemodialysis Comments  (pt resting w/ eyes closed, resp noted, stable, ufr 1158) Progressing as prescribed (Pt resting w/ eyes closed, resp noted, ufr 1309) Progressing as prescribed (Pt awake, stable, no c/o, ufr 1573)    04/13/21 1145 04/13/21 1200  Vitals  BP (!) 158/88 (!) 152/96  MAP (mmHg) 104 113  Pulse Rate (!) 53 92  Pulse Rate Source  --   --   ECG Heart Rate (!) 108 91  Resp 17 12  During Hemodialysis Assessment  Blood Flow Rate (mL/min) 300 mL/min 300 mL/min  Arterial Pressure (mmHg) -150 mmHg -150 mmHg  Venous Pressure (mmHg) 110 mmHg 110 mmHg  Transmembrane Pressure (mmHg) 50 mmHg 50 mmHg  Ultrafiltration Rate (mL/min) 840 mL/min 840 mL/min  Dialysate Flow Rate (mL/min) 600 ml/min 600 ml/min  Conductivity: Machine  14.1 14.1  HD Safety Checks Performed Yes Yes  Dialysate Change  --   --   Intra-Hemodialysis Comments Progressing as prescribed Progressing as prescribed

## 2021-04-13 NOTE — Progress Notes (Signed)
PT Cancellation Note  Patient Details Name: Kirsten Mcmahon MRN: [id number] DOB: Aug 15, 1946   Cancelled Treatment:    Reason Eval/Treat Not Completed: Patient at procedure or test/unavailable (Chart reviewed for attempted treatment session.  Patient currently out of room for dialysis.  Will re-attempt at later time/date as medically appropriate and available.)  Chirag Krueger H. Owens Shark, PT, DPT, NCS 04/13/21, 11:33 AM [phone]

## 2021-04-13 NOTE — Evaluation (Signed)
Occupational Therapy Evaluation Patient Details Name: Kirsten Mcmahon MRN: [id number] DOB: 04/19/1946 Today's Date: 04/13/2021    History of Present Illness Pt is a 75 year old female with PMH including CKD, HTN, Type 2 diabetes, ESRD, and prior CVA with L side hemiplegia.  Pt presented to ER secondary to fluid overload; admitted for management of acute/chronic CKD (stage IV).  Hopsital course significant for R perm-cath placement (5/6).   Clinical Impression   Kirsten Mcmahon presents to OT with impaired cognition, generalized weakness, and L side hemiplegia with new edema that impacts her ability to safely and independently engage in functional tasks.  Pt was agreeable to OT evaluation, but was disoriented and fairly irritable during session.  RN present during majority of session.  Pt was oriented to self only; able to follow one-step commands but participation limited by frequent task refusal.  Information gathered re: home setup and PLOF is via chart review and PT report.  Pt able to complete supine > sit transfer with mod assist for BLE and trunk management.  Anticipate grossly min-mod assist required for basic ADLs given L side hemiplegia and impaired cognition.  As pt appears to be close to baseline level of functioning, HHOT with 24 hour supervision is most appropriate discharge recommendation.  Pt will likely continue to benefit from skilled OT services (as pt is more willing) in acute setting to address functional cognition and safety and independence in ADLs given acute change in edema and functional strength.      Follow Up Recommendations  Home health OT;Supervision/Assistance - 24 hour    Equipment Recommendations  None recommended by OT    Recommendations for Other Services       Precautions / Restrictions Precautions Precautions: Fall Precaution Comments: R perm-cath; history of R fronto-temporal craniectomy with musculocutaneous flap over site Restrictions Weight Bearing  Restrictions: No      Mobility Bed Mobility Overal bed mobility: Needs Assistance Bed Mobility: Supine to Sit     Supine to sit: Mod assist          Transfers                 General transfer comment: unable to assess OOB transfers 2/2 pt refusal, able to perform supine > sit with mod assist for BLE and trunk management    Balance Overall balance assessment: Needs assistance Sitting-balance support: No upper extremity supported;Feet supported Sitting balance-[name] Scale: Fair Sitting balance - Comments: min assist for initial sitting balance due to posterior lean, improved to close sup with forward weight shifting activities                                   ADL either performed or assessed with clinical judgement   ADL Overall ADL's : Needs assistance/impaired                                       General ADL Comments: Unable to fully assess 2/2 pt refusal, but anticipate grossly min-mod assist for ADLs given L side hemiplegia.     Vision   Vision Assessment?: Vision impaired- to be further tested in functional context Additional Comments: L visual field deficit, R sided gaze observed     Perception     Praxis      Pertinent Vitals/Pain Pain Assessment: Faces Faces Pain Scale:  Hurts little more Pain Location: pt unable to verbalize any location of pain, but winced with all movement or attempts at mobility Pain Descriptors / Indicators: Grimacing;Guarding;Moaning Pain Intervention(s): Limited activity within patient's tolerance;Monitored during session (RN present during session)     Hand Dominance     Extremity/Trunk Assessment Upper Extremity Assessment Upper Extremity Assessment: RUE deficits/detail;LUE deficits/detail RUE Deficits / Details: grossly WFL LUE Deficits / Details: L side hemiplegia with ROM deficits noted, moderate edema in LUE and LLE   Lower Extremity Assessment Lower Extremity Assessment: RLE  deficits/detail;LLE deficits/detail RLE Deficits / Details: grossly WFL LLE Deficits / Details: L LE grossly hemiplegic, limited L knee flexion noted       Communication Communication Communication: Other (comment) (pt oriented to self only, able to follow one-step commands but fairly irritable/disoriented during session)   Cognition Arousal/Alertness: Awake/alert (fluctuating level of arousal, appeared to be pretending to sleep throughout session) Behavior During Therapy: Agitated Overall Cognitive Status: No family/caregiver present to determine baseline cognitive functioning                                 General Comments: oriented to self only; able to follow one step commands but fairly irritable during session   General Comments       Exercises Other Exercises Other Exercises: provided education re: OT role and plan of care, self care and bed mobility   Shoulder Instructions      Home Living Family/patient expects to be discharged to:: Private residence Living Arrangements: Children Available Help at Discharge: Available PRN/intermittently;Family Type of Home: House Home Access: Ramped entrance     Home Layout: One level     Bathroom Shower/Tub: Tub/shower unit         Home Equipment: Shower seat;Wheelchair - manual   Additional Comments: Patient limited historian; information obtained from the chart      Prior Functioning/Environment Level of Independence: Needs assistance  Gait / Transfers Assistance Needed: primary means of mobility in wheelchair ADL's / Homemaking Assistance Needed: required assistance for ADLs   Comments: Patient limited historian; information obtained from chart.  WC level as primary mobility, assist for ADLs and transfers between seating surfaces.  Lives with son and receives support services from [facility].        OT Problem List: Decreased strength;Decreased range of motion;Impaired balance (sitting and/or  standing);Impaired vision/perception;Decreased coordination;Decreased cognition;Decreased safety awareness;Decreased knowledge of use of DME or AE;Impaired UE functional use;Increased edema      OT Treatment/Interventions: Self-care/ADL training;Therapeutic exercise;Neuromuscular education;DME and/or AE instruction;Therapeutic activities;Cognitive remediation/compensation;Visual/perceptual remediation/compensation;Patient/family education;Balance training    OT Goals(Current goals can be found in the care plan section) Acute Rehab OT Goals Patient Stated Goal: pt unable to state goal OT Goal Formulation: Patient unable to participate in goal setting Time For Goal Achievement: 04/27/21 Potential to Achieve Goals: Fair  OT Frequency: Min 1X/week   Barriers to D/C:            Co-evaluation              AM-PAC OT "6 Clicks" Daily Activity     Outcome Measure Help from another person eating meals?: A Little Help from another person taking care of personal grooming?: A Little Help from another person toileting, which includes using toliet, bedpan, or urinal?: A Lot Help from another person bathing (including washing, rinsing, drying)?: A Lot Help from another person to put on and taking off regular  upper body clothing?: A Little Help from another person to put on and taking off regular lower body clothing?: A Little 6 Click Score: 16   End of Session Nurse Communication: Other (comment) (RN present during session, aware of pt agitation)  Activity Tolerance: Treatment limited secondary to agitation Patient left: in bed;with call bell/phone within reach;with nursing/sitter in room;with bed alarm set  OT Visit Diagnosis: Other abnormalities of gait and mobility (R26.89);Other symptoms and signs involving cognitive function                Time: 7681-1572 OT Time Calculation (min): 15 min Charges:  OT General Charges $OT Visit: 1 Visit OT Evaluation $OT Eval Moderate Complexity: 1  Mod  Gauri Galvao Wells Khadeejah Castner, OTR/L 04/13/21, 10:44 AM

## 2021-04-13 NOTE — Progress Notes (Signed)
Patient returned from dialysis alert and oriented to self.  Very pleasant able to put IV in and at the end of the shift the patient pulled the IV out and I was not able to reinsert.  MD made aware

## 2021-04-13 NOTE — Plan of Care (Signed)
  Problem: Education: Goal: Knowledge of General Education information will improve Description: Including pain rating scale, medication(s)/side effects and non-pharmacologic comfort measures 04/13/2021 1026 by Haruye Lainez, Winifred Olive, RN Outcome: Progressing 04/13/2021 1025 by Vivien Rota, RN Outcome: Progressing   Problem: Health Behavior/Discharge Planning: Goal: Ability to manage health-related needs will improve 04/13/2021 1026 by Jadesola Poynter, Winifred Olive, RN Outcome: Progressing 04/13/2021 1025 by Vivien Rota, RN Outcome: Progressing   Problem: Clinical Measurements: Goal: Ability to maintain clinical measurements within normal limits will improve 04/13/2021 1026 by Tajai Suder, Winifred Olive, RN Outcome: Progressing 04/13/2021 1025 by Vivien Rota, RN Outcome: Progressing Goal: Will remain free from infection 04/13/2021 1026 by Vivien Rota, RN Outcome: Progressing 04/13/2021 1025 by Vivien Rota, RN Outcome: Progressing Goal: Diagnostic test results will improve 04/13/2021 1026 by Vivien Rota, RN Outcome: Progressing 04/13/2021 1025 by Vivien Rota, RN Outcome: Progressing Goal: Respiratory complications will improve 04/13/2021 1026 by Vivien Rota, RN Outcome: Progressing 04/13/2021 1025 by Vivien Rota, RN Outcome: Progressing Goal: Cardiovascular complication will be avoided 04/13/2021 1026 by Vivien Rota, RN Outcome: Progressing 04/13/2021 1025 by Vivien Rota, RN Outcome: Progressing   Problem: Activity: Goal: Risk for activity intolerance will decrease 04/13/2021 1026 by Brylee Mcgreal, Winifred Olive, RN Outcome: Progressing 04/13/2021 1025 by Vivien Rota, RN Outcome: Progressing   Problem: Nutrition: Goal: Adequate nutrition will be maintained 04/13/2021 1026 by Vivien Rota, RN Outcome: Progressing 04/13/2021 1025 by Vivien Rota, RN Outcome: Progressing   Problem: Coping: Goal: Level  of anxiety will decrease 04/13/2021 1026 by Vivien Rota, RN Outcome: Progressing 04/13/2021 1025 by Vivien Rota, RN Outcome: Progressing   Problem: Elimination: Goal: Will not experience complications related to bowel motility 04/13/2021 1026 by Vivien Rota, RN Outcome: Progressing 04/13/2021 1025 by Vivien Rota, RN Outcome: Progressing Goal: Will not experience complications related to urinary retention 04/13/2021 1026 by Vivien Rota, RN Outcome: Progressing 04/13/2021 1025 by Vivien Rota, RN Outcome: Progressing   Problem: Pain Managment: Goal: General experience of comfort will improve 04/13/2021 1026 by Vivien Rota, RN Outcome: Progressing 04/13/2021 1025 by Vivien Rota, RN Outcome: Progressing   Problem: Safety: Goal: Ability to remain free from injury will improve 04/13/2021 1026 by Vivien Rota, RN Outcome: Progressing 04/13/2021 1025 by Vivien Rota, RN Outcome: Progressing   Problem: Skin Integrity: Goal: Risk for impaired skin integrity will decrease 04/13/2021 1026 by Vivien Rota, RN Outcome: Progressing 04/13/2021 1025 by Vivien Rota, RN Outcome: Progressing

## 2021-04-13 NOTE — Care Management Important Message (Signed)
Important Message  Patient Details  Name: Kirsten Mcmahon MRN: [id number] Date of Birth: 07/06/1946   Medicare Important Message Given:  Yes     [name] 04/13/2021, 10:57 AM

## 2021-04-13 NOTE — Progress Notes (Signed)
   04/13/21 0934 04/13/21 0940 04/13/21 1000  Vitals  BP (!) 161/83 (!) 161/83 (!) 151/90  MAP (mmHg) 110 104 107  Pulse Rate  --  78 83  Pulse Rate Source Monitor  --   --   ECG Heart Rate 95 77 79  Resp 16 19 12   During Hemodialysis Assessment  Blood Flow Rate (mL/min) 250 mL/min 300 mL/min 300 mL/min  Arterial Pressure (mmHg) -100 mmHg -150 mmHg -150 mmHg  Venous Pressure (mmHg) 70 mmHg 90 mmHg 90 mmHg  Transmembrane Pressure (mmHg) 30 mmHg 50 mmHg 50 mmHg  Ultrafiltration Rate (mL/min) 830 mL/min 830 mL/min 830 mL/min  Dialysate Flow Rate (mL/min) 600 ml/min 600 ml/min 600 ml/min  Conductivity: Machine  14.1 14.1 14  HD Safety Checks Performed Yes Yes Yes  Dialysis Fluid Bolus Normal Saline  --   --   Bolus Amount (mL) 200 mL  --   --   Dialysate Change 2K 2K 2K  Intra-Hemodialysis Comments Tx initiated (Pt resting, resp noted, no c/o, stable, tx start) Progressing as prescribed Progressing as prescribed    04/13/21 1015 04/13/21 1030 04/13/21 1045  Vitals  BP (!) 153/99 (!) 153/99  --   MAP (mmHg) 117 117  --   Pulse Rate (!) 101 (!) 107  --   Pulse Rate Source Monitor  --   --   ECG Heart Rate 67 (!) 104  --   Resp 17 19  --   During Hemodialysis Assessment  Blood Flow Rate (mL/min) 300 mL/min 300 mL/min 300 mL/min  Arterial Pressure (mmHg) -150 mmHg -150 mmHg -150 mmHg  Venous Pressure (mmHg) 90 mmHg 100 mmHg 100 mmHg  Transmembrane Pressure (mmHg) 50 mmHg 50 mmHg 50 mmHg  Ultrafiltration Rate (mL/min) 830 mL/min 830 mL/min 830 mL/min  Dialysate Flow Rate (mL/min) 600 ml/min 600 ml/min 600 ml/min  Conductivity: Machine  14.1 14 14.1  HD Safety Checks Performed Yes Yes Yes  Dialysis Fluid Bolus  --   --   --   Bolus Amount (mL)  --   --   --   Dialysate Change 2K 2K 2K  Intra-Hemodialysis Comments Progressing as prescribed (Pt resting w/ eyes closed, resp noted, stable, ufr 541) Progressing as prescribed (Pt alert, talking w/ hd coordinator, no c/o, stable, ufr 796)  Progressing as prescribed (Pt resting w/ eyes closed, resp noted, stable, ufr 860)

## 2021-04-13 NOTE — Progress Notes (Signed)
04/11/21 0910 04/11/21 0915 04/11/21 0930  Vitals  Temp 98 F (36.7 C)  --   --   Temp Source Oral  --   --   BP (!) 141/86 (!) 155/77 (!) 154/87  BP Location Left Arm  --   --   BP Method Automatic  --   --   Patient Position (if appropriate) Lying  --   --   Pulse Rate 81 80 78  Pulse Rate Source Dinamap  --   --   Resp 16 16 16   During Hemodialysis Assessment  Blood Flow Rate (mL/min) 250 mL/min 250 mL/min 250 mL/min  Arterial Pressure (mmHg) -80 mmHg -80 mmHg -80 mmHg  Venous Pressure (mmHg) 40 mmHg 40 mmHg 40 mmHg  Transmembrane Pressure (mmHg) 70 mmHg 70 mmHg 70 mmHg  Ultrafiltration Rate (mL/min) 1000 mL/min 1000 mL/min 1000 mL/min  Dialysate Flow Rate (mL/min) 500 ml/min 500 ml/min 500 ml/min  Conductivity: Machine  14 14 14   HD Safety Checks Performed Yes Yes Yes  Intra-Hemodialysis Comments Tx initiated Progressing as prescribed  --     04/11/21 0945 04/11/21 1030 04/11/21 1045  Vitals  Temp  --   --   --   Temp Source  --   --   --   BP (!) 158/86 (!) 147/93 (!) 154/92  BP Location  --   --   --   BP Method  --   --   --   Patient Position (if appropriate)  --   --   --   Pulse Rate 78 78 80  Pulse Rate Source  --   --   --   Resp 14 14 14   During Hemodialysis Assessment  Blood Flow Rate (mL/min) 250 mL/min 250 mL/min 250 mL/min  Arterial Pressure (mmHg) -80 mmHg -80 mmHg -80 mmHg  Venous Pressure (mmHg) 40 mmHg 40 mmHg 40 mmHg  Transmembrane Pressure (mmHg) 70 mmHg 70 mmHg 70 mmHg  Ultrafiltration Rate (mL/min) 1000 mL/min 1000 mL/min 1000 mL/min  Dialysate Flow Rate (mL/min) 500 ml/min 500 ml/min 500 ml/min  Conductivity: Machine  14 14 14   HD Safety Checks Performed Yes Yes Yes  Intra-Hemodialysis Comments Progressing as prescribed Progressing as prescribed Progressing as prescribed    04/11/21 1100 04/11/21 1115 04/11/21 1130  Vitals  Temp  --   --   --   Temp Source  --   --   --   BP (!) 155/104 (!) 153/100 (!) 155/95  BP Location  --   --   --    BP Method  --   --   --   Patient Position (if appropriate)  --   --   --   Pulse Rate 94 93 96  Pulse Rate Source  --   --   --   Resp 14 15 11   During Hemodialysis Assessment  Blood Flow Rate (mL/min) 250 mL/min 250 mL/min 250 mL/min  Arterial Pressure (mmHg) -80 mmHg -80 mmHg -80 mmHg  Venous Pressure (mmHg) 40 mmHg 40 mmHg 40 mmHg  Transmembrane Pressure (mmHg) 70 mmHg 70 mmHg 70 mmHg  Ultrafiltration Rate (mL/min) 1000 mL/min 1000 mL/min 1000 mL/min  Dialysate Flow Rate (mL/min) 500 ml/min 500 ml/min 500 ml/min  Conductivity: Machine  14 14 14   HD Safety Checks Performed Yes Yes Yes  Intra-Hemodialysis Comments Progressing as prescribed Progressing as prescribed Progressing as prescribed    04/11/21 1140  Vitals  Temp  --   Temp Source  --  BP (!) 144/103  BP Location  --   BP Method  --   Patient Position (if appropriate)  --   Pulse Rate 80  Pulse Rate Source  --   Resp 11  During Hemodialysis Assessment  Blood Flow Rate (mL/min) 250 mL/min  Arterial Pressure (mmHg) -80 mmHg  Venous Pressure (mmHg) 40 mmHg  Transmembrane Pressure (mmHg) 70 mmHg  Ultrafiltration Rate (mL/min) 1000 mL/min  Dialysate Flow Rate (mL/min) 500 ml/min  Conductivity: Machine  14  HD Safety Checks Performed Yes  Intra-Hemodialysis Comments Progressing as prescribed

## 2021-04-13 NOTE — Progress Notes (Signed)
[name]  ROUNDING NOTE   Subjective:   Patient seen during dialysis   HEMODIALYSIS FLOWSHEET:  Blood Flow Rate (mL/min): 300 mL/min Arterial Pressure (mmHg): -180 mmHg Venous Pressure (mmHg): 100 mmHg Transmembrane Pressure (mmHg): 60 mmHg Ultrafiltration Rate (mL/min): 840 mL/min Dialysate Flow Rate (mL/min): 600 ml/min Conductivity: Machine : 14.1 Conductivity: Machine : 14.1 Dialysis Fluid Bolus: Normal Saline Bolus Amount (mL): 200 mL Dialysate Change: 2K  Alert and confused during dialysis  Oriented to self and knows she in a hospital, but unable to name facility.  Objective:  Vital signs in last 24 hours:  Temp:  [97.3 F (36.3 C)-98.5 F (36.9 C)] 98.5 F (36.9 C) (05/09 0930) Pulse Rate:  [78-107] 94 (05/09 1115) Resp:  [12-22] 20 (05/09 1115) BP: (135-161)/(75-99) 135/84 (05/09 1115) SpO2:  [97 %-100 %] 98 % (05/09 1115) Weight:  [67.3 kg] 67.3 kg (05/09 0500)  Weight change:  Filed Weights   04/09/21 1543 04/10/21 0500 04/13/21 0500  Weight: 70.8 kg 71.7 kg 67.3 kg    Intake/Output: I/O last 3 completed shifts: In: 916 [P.O.:360; Blood:556] Out: 450 [Urine:450]   Intake/Output this shift:  No intake/output data recorded.  Physical Exam: General: NAD, resting in bed  Head: Normocephalic, atraumatic. Moist oral mucosal membranes  Eyes: Anicteric  Lungs:  Clear   Heart: Irregular rhythm  Abdomen:  Soft, nontender,  distended  Extremities:  + peripheral edema.  Neurologic: Alert and oriented  Skin: No lesions  Access: RIJ permcath 5/6 Dr. Lucky Cowboy, Bleeding at exit site. Pressure dressings applied.     Basic Metabolic Panel: Recent Labs  Lab 04/09/21 1545 04/10/21 0753  NA 137 136  K 4.9 3.9  CL 108 100  CO2 17* 25  GLUCOSE 111* 67*  BUN 79* 71*  CREATININE 7.99* 6.89*  CALCIUM 7.3* 6.2*    Liver Function Tests: Recent Labs  Lab 04/10/21 0753  AST 11*  ALT 9  ALKPHOS 53  BILITOT 0.5  PROT 5.6*  ALBUMIN 2.5*    No results for input(s): LIPASE, AMYLASE in the last 168 hours. No results for input(s): AMMONIA in the last 168 hours.  CBC: Recent Labs  Lab 04/09/21 1545 04/10/21 0753 04/11/21 0758  WBC 10.6* 7.4  --   HGB 8.1* 6.1* 6.8*  HCT 24.8* 18.6* 20.4*  MCV 92.2 90.7  --   PLT 246 179  --     Cardiac Enzymes: No results for input(s): CKTOTAL, CKMB, CKMBINDEX, TROPONINI in the last 168 hours.  BNP: Invalid input(s): POCBNP  CBG: Recent Labs  Lab 04/12/21 0752 04/12/21 1114 04/12/21 1643 04/12/21 2153 04/13/21 0739  GLUCAP 74 120* 158* 94 115*    Microbiology: Results for orders placed or performed during the hospital encounter of 04/09/21  Resp Panel by RT-PCR (Flu A&B, Covid) Nasopharyngeal Swab     Status: None   Collection Time: 04/09/21  5:58 PM   Specimen: Nasopharyngeal Swab; Nasopharyngeal(NP) swabs in vial transport medium  Result Value Ref Range Status   SARS Coronavirus 2 by RT PCR NEGATIVE NEGATIVE Final    Comment: (NOTE) SARS-CoV-2 target nucleic acids are NOT DETECTED.  The SARS-CoV-2 RNA is generally detectable in upper respiratory specimens during the acute phase of infection. The lowest concentration of SARS-CoV-2 viral copies this assay can detect is 138 copies/mL. A negative result does not preclude SARS-Cov-2 infection and should not be used as the sole basis for treatment or other patient management decisions. A negative result may occur with  improper specimen  collection/handling, submission of specimen other than nasopharyngeal swab, presence of viral mutation(s) within the areas targeted by this assay, and inadequate number of viral copies(<138 copies/mL). A negative result must be combined with clinical observations, patient history, and epidemiological information. The expected result is Negative.  Fact Sheet for Patients:  EntrepreneurPulse.com.au  Fact Sheet for Healthcare Providers:   IncredibleEmployment.be  This test is no t yet approved or cleared by the [place] FDA and  has been authorized for detection and/or diagnosis of SARS-CoV-2 by FDA under an Emergency Use Authorization (EUA). This EUA will remain  in effect (meaning this test can be used) for the duration of the COVID-19 declaration under Section 564(b)(1) of the Act, 21 U.S.C.section 360bbb-3(b)(1), unless the authorization is terminated  or revoked sooner.       Influenza A by PCR NEGATIVE NEGATIVE Final   Influenza B by PCR NEGATIVE NEGATIVE Final    Comment: (NOTE) The Xpert Xpress SARS-CoV-2/FLU/RSV plus assay is intended as an aid in the diagnosis of influenza from Nasopharyngeal swab specimens and should not be used as a sole basis for treatment. Nasal washings and aspirates are unacceptable for Xpert Xpress SARS-CoV-2/FLU/RSV testing.  Fact Sheet for Patients: EntrepreneurPulse.com.au  Fact Sheet for Healthcare Providers: IncredibleEmployment.be  This test is not yet approved or cleared by the [place] FDA and has been authorized for detection and/or diagnosis of SARS-CoV-2 by FDA under an Emergency Use Authorization (EUA). This EUA will remain in effect (meaning this test can be used) for the duration of the COVID-19 declaration under Section 564(b)(1) of the Act, 21 U.S.C. section 360bbb-3(b)(1), unless the authorization is terminated or revoked.  Performed at [facility], [address] [place], [place] [address]     Coagulation Studies: No results for input(s): LABPROT, INR in the last 72 hours.  Urinalysis: No results for input(s): COLORURINE, LABSPEC, PHURINE, GLUCOSEU, HGBUR, BILIRUBINUR, KETONESUR, PROTEINUR, UROBILINOGEN, NITRITE, LEUKOCYTESUR in the last 72 hours.  Invalid input(s): APPERANCEUR    Imaging: No results found.   Medications:   . sodium chloride    . sodium chloride      . amLODipine  10 mg Oral Daily  . Chlorhexidine Gluconate Cloth  6 each Topical Q0600  . cholecalciferol  1,000 Units Oral Daily  . [START ON 04/15/2021] cloNIDine  0.2 mg Transdermal Weekly  . heparin  5,000 Units Subcutaneous Q8H  . insulin aspart  0-5 Units Subcutaneous QHS  . insulin aspart  0-9 Units Subcutaneous TID WC  . latanoprost  1 drop Both Eyes QHS  . letrozole  2.5 mg Oral Daily  . levETIRAcetam  500 mg Oral Daily  . levothyroxine  112 mcg Oral Q0600  . metoprolol succinate  50 mg Oral Daily  . pneumococcal 23 valent vaccine  0.5 mL Intramuscular Tomorrow-1000   sodium chloride, sodium chloride, acetaminophen **OR** acetaminophen, alteplase, calcium carbonate, camphor-menthol **AND** hydrOXYzine, docusate sodium, feeding supplement (NEPRO CARB STEADY), heparin, lidocaine (PF), lidocaine-prilocaine, ondansetron **OR** ondansetron (ZOFRAN) IV, pentafluoroprop-tetrafluoroeth, sorbitol, zolpidem  Assessment/ Plan:  Ms. RONDELL FRICK is a 74 y.o.  female  with past medical history of GERD, Hypertension, diabetes, hyperlipidemia, CVA and CKD stage 4. She presents to the ED with increased lower extremity edema. She is known to our team from previous admissions.   1. End Stage Renal Disease requiring new start hemodialysis:  First dialysis treatment 5/6.   -Receiving third treatment today - Pressure dressing remains in place - Patient too confused and unable to follow commands  to be placed in a chair for this treatment. - Outpatient planning: Dialysis liaison notified of patient and will work with team to determine outpatient needs and placement  2. Anemia of chronic kidney disease  Lab Results  Component Value Date   HGB 6.8 (L) 04/11/2021  Rechecking labs today with dialysis EPO will begin with treatments   3. Secondary Hyperparathyroidism: with hypocalcemia   Lab Results  Component Value Date   PTH 161 (H) 01/18/2021   CALCIUM 6.2 (LL) 04/10/2021   PHOS 4.5  01/18/2021  Phosphorus within range Not currently on an vitamin D agent.   4.Diabetes mellitus type II with chronic kidney disease noninsulin dependent.  hemoglobin A1c is 5.7 on 01/18/21.  Stable at this time  5. Hypertension: elevated with volume overload. Home regimen of clonidine, amlodipine, metoprolol.    LOS: 4   5/9/202211:24 AM

## 2021-04-13 NOTE — Progress Notes (Signed)
[place] at [place] NAME: Kirsten Mcmahon    MR#:  [id number]  DATE OF BIRTH:  09/20/1946  SUBJECTIVE:   Patient has been confused since yesterday evening. Received one dose of 2 mg IV Ativan. Currently laying in bed telling me she does not want to eat wants to go home. Tell me to leave her alone REVIEW OF SYSTEMS:   Review of Systems  Constitutional: Negative for chills, fever and weight loss.  HENT: Negative for ear discharge, ear pain and nosebleeds.   Eyes: Negative for blurred vision, pain and discharge.  Respiratory: Negative for sputum production, shortness of breath, wheezing and stridor.   Cardiovascular: Negative for chest pain, palpitations, orthopnea and PND.  Gastrointestinal: Negative for abdominal pain, diarrhea, nausea and vomiting.  Genitourinary: Negative for frequency and urgency.  Musculoskeletal: Negative for back pain and joint pain.  Neurological: Positive for weakness. Negative for sensory change, speech change and focal weakness.  Psychiatric/Behavioral: Negative for depression and hallucinations. The patient is not nervous/anxious.    Tolerating Diet: yes Tolerating PT: HHPT  DRUG ALLERGIES:   Allergies  Allergen Reactions  . Contrast Media [Iodinated Diagnostic Agents]     Other reaction(s): NO ALLERGY  . Latex     Other reaction(s): NO ALLERGY  . Shellfish-Derived Products     Other reaction(s): NO ALLERGY  . Levemir [Insulin Detemir] Itching    VITALS:  Blood pressure (!) 156/94, pulse 98, temperature 98.5 F (36.9 C), temperature source Oral, resp. rate (!) 28, height 5\' 2"  (1.575 m), weight 67.3 kg, SpO2 99 %.  PHYSICAL EXAMINATION:   Physical Exam  GENERAL:  75 y.o.-year-old patient lying in the bed with no acute distress.Mornid obesity  Pallor+ HEENT: Head atraumatic, normocephalic. Oropharynx and nasopharynx clear.  Right IJ cath + LUNGS: Normal breath sounds bilaterally, no wheezing, rales,  rhonchi. No use of accessory muscles of respiration.  CARDIOVASCULAR: S1, S2 normal. No murmurs, rubs, or gallops.  ABDOMEN: Soft, nontender, nondistended. Bowel sounds present. No organomegaly or mass.  EXTREMITIES: Left LE swelling decreasing    NEUROLOGIC: left UE chronic weakness for CVA.  PSYCHIATRIC:  patient is alert and oriented x 3.  SKIN: No obvious rash, lesion, or ulcer.   LABORATORY PANEL:  CBC Recent Labs  Lab 04/10/21 0753 04/11/21 0758  WBC 7.4  --   HGB 6.1* 6.8*  HCT 18.6* 20.4*  PLT 179  --     Chemistries  Recent Labs  Lab 04/10/21 0753  NA 136  K 3.9  CL 100  CO2 25  GLUCOSE 67*  BUN 71*  CREATININE 6.89*  CALCIUM 6.2*  AST 11*  ALT 9  ALKPHOS 53  BILITOT 0.5   Cardiac Enzymes No results for input(s): TROPONINI in the last 168 hours. RADIOLOGY:  No results found. ASSESSMENT AND PLAN:   Kirsten Mcmahon is a 75 y.o. female with medical history significant of chronic kidney disease stage IV, GERD, hypertension, hypothyroidism, non-insulin-dependent diabetes, hyperlipidemia, previous CVA and overactive bladder who has been seeing nephrology but not yet ready for hemodialysis. She noticed worsening lower extremities edema left more than right.   Acute on chronic chronic kidney disease stage IV--now progressed to to ESRD Confusion/Acute Delirium -- Patient appears to have medical renal disease at this point.  -- Nephrology Dr Juleen China input appreciated. Patient had right IJ HD access place by Dr. Lucky Cowboy. --5/6-- first HD treatment --5/7-- bleeding from vascular site. Vascular on-call aware. Follow recommendations. -5/8  worked with PT, right IJ cath site no bleeding --5/9-- patient confused. Received Ativan yesterday. Per Dr. Juleen China patient to get dialysis sitting out in the chair. Outpatient dialysis set up in process.  Left Lower extremity edema: Left greater than right.   -- lower extremity Doppler negative for DVT unclear reason for unilateral  edema  Essential hypertension: -- amlodipine, metoprolol, clonidine patch --prn hydralazine  anemia of chronic disease: Most likely due to chronic renal disease. -- 5/7-- got 1 unit blood transfusion with dialysis  --5/8--cbc for am  Type 2 diabetes: Sliding scale insulin. --sugars in the 63--93  Hypothyroidism:  --on levothyroxine.   GERD: PPIs   DVT prophylaxis: Heparin Code Status: Full code Family Communication:  message for son [name] on 5/8 Disposition Plan: TBD Consults called:  nephrology Admission status: Inpatient  Procedures:Right IJ HD access Level of care: Med-Surg  Remains inpatient appropriate because:Inpatient level of care appropriate due to severity of illness   Patient  started on hemodialysis for new diagnosis of end-stage renal disease. PT to see pt  TOC for d/c planning       TOTAL TIME TAKING CARE OF THIS PATIENT: *25* minutes.  >50% time spent on counselling and coordination of care  Note: This dictation was prepared with Dragon dictation along with smaller phrase technology. Any transcriptional errors that result from this process are unintentional.  Fritzi Mandes M.D    [facility]   CC: Primary care physician; [facility], IncPatient ID: Kirsten Mcmahon, female   DOB: 1946/05/31, 75 y.o.   MRN: [id number]

## 2021-04-13 NOTE — TOC Progression Note (Addendum)
Transition of Care [facility]) - Progression Note    Patient Details  Name: Kirsten Mcmahon MRN: [id number] Date of Birth: 28-Dec-1945  Transition [facility] CM/SW [place], [place] Phone Number: 04/13/2021, 11:53 AM  Clinical Narrative:     Update: Kirsten Mcmahon with [facility] called back, is working with her team to see if they can set patient up with Home Health services (depending on if she will be residing in [place] with son). She reports she will discuss in their meeting at [place] today and let me know tomorrow. Kirsten Mcmahon is also requesting to be updated once outpatient HD center is set up. Kirsten Mcmahon can be reached at [phone].      CSW spoke with patient's son Kirsten Mcmahon regarding discharge planning. Does report he is working on transferring [employer] to [place], has been in communication with them today and should be effective as June 1st.   Reports there will be no gap in coverage and patient will continue to receive [facility] services.   Kirsten Mcmahon does confirm he will be able to transport patient to and from HD after discharge.   Kirsten Mcmahon is in agreement with setting up home health PT, CSW has reached out to [place] with [facility] to coordinate any preference of agency for home services. CSW has lvm, pending call back at this time.   Kirsten Mcmahon reports no further questions or concerns.   Expected Discharge Plan: [place] Barriers to Discharge: Continued Medical Work up  Expected Discharge Plan and Services Expected Discharge Plan: [place] arrangements for the past 2 months: Single Family Home                                       Social Determinants of Health (SDOH) Interventions    Readmission Risk Interventions Readmission Risk Prevention Plan 04/11/2021  Transportation Screening Complete  PCP or Specialist Appt within 3-5 Days Complete  HRI or [place] Complete  Social Work Consult for  [place] Planning/Counseling Complete  Palliative Care Screening Not Applicable  Medication Review Press photographer) Complete  Some recent data might be hidden

## 2021-04-13 NOTE — Progress Notes (Signed)
IVT consulted for difficult stick.  Current RN states PIV access not necessary, but will assess and place new consult if necessary.

## 2021-04-14 ENCOUNTER — Inpatient Hospital Stay: Payer: Medicare (Managed Care)

## 2021-04-14 ENCOUNTER — Encounter: Payer: Self-pay | Admitting: Internal Medicine

## 2021-04-14 DIAGNOSIS — I10 Essential (primary) hypertension: Secondary | ICD-10-CM | POA: Diagnosis not present

## 2021-04-14 DIAGNOSIS — N186 End stage renal disease: Secondary | ICD-10-CM | POA: Diagnosis not present

## 2021-04-14 DIAGNOSIS — N182 Chronic kidney disease, stage 2 (mild): Secondary | ICD-10-CM | POA: Diagnosis not present

## 2021-04-14 DIAGNOSIS — E1122 Type 2 diabetes mellitus with diabetic chronic kidney disease: Secondary | ICD-10-CM | POA: Diagnosis not present

## 2021-04-14 LAB — GLUCOSE, CAPILLARY
Glucose-Capillary: 82 mg/dL (ref 70–99)
Glucose-Capillary: 126 mg/dL — ABNORMAL HIGH (ref 70–99)
Glucose-Capillary: 95 mg/dL (ref 70–99)

## 2021-04-14 IMAGING — CT CT HEAD W/O CM
4 series · 16 of 47 positions shown, 18 images · non-contrast
Comparison: Brain MRI [DATE].  Head CT [DATE].

CLINICAL DATA: Mental status change, unknown cause. Additional
history provided: Mental status change, past history of cerebral
aneurysm repair, seizure and stroke.

EXAM:
CT HEAD WITHOUT CONTRAST
TECHNIQUE: Contiguous axial images were obtained from the base of the skull
through the vertex without intravenous contrast.

[Series 2: head wo · axial · 0.42mm/px · z∈[-126,-16]mm · 7 of 30 slices shown, 9 images]
[im 4/30  brain]
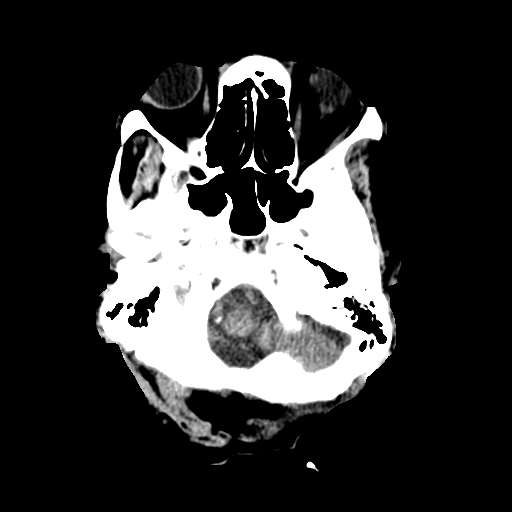
[im 4/30  bone]
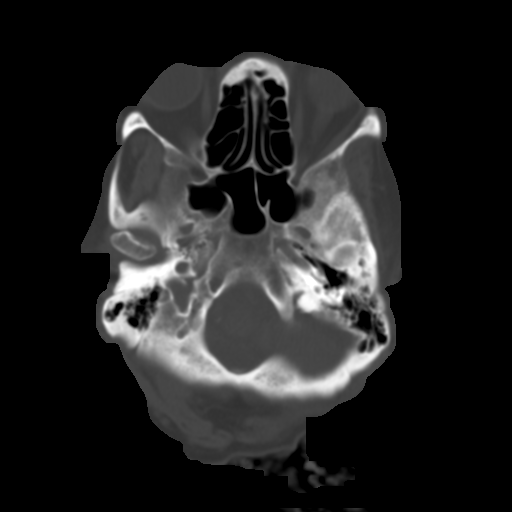
[im 8/30  brain]
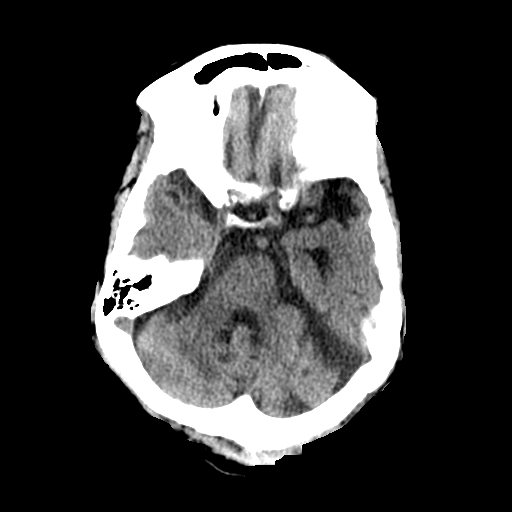
[im 11/30  brain]
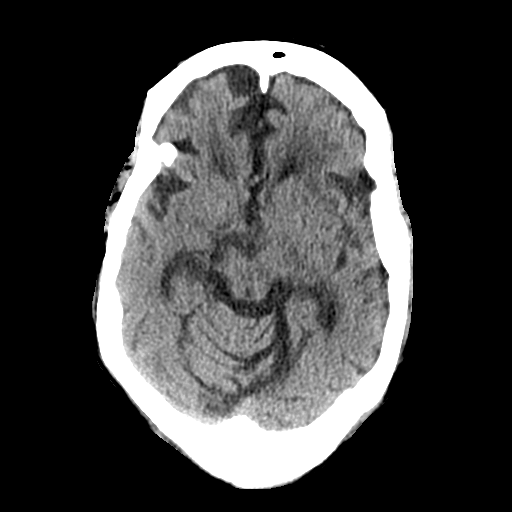
[im 15/30  brain]
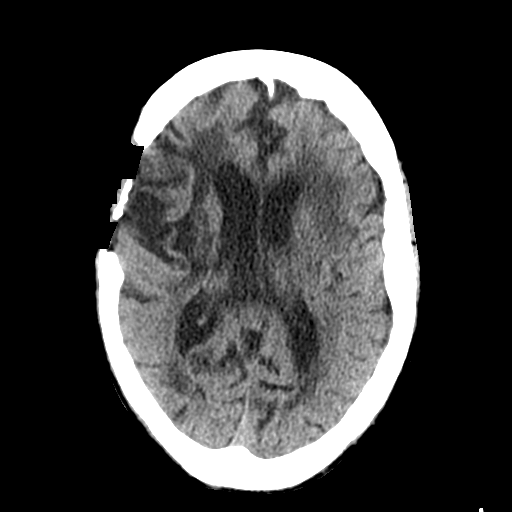
[im 19/30  brain]
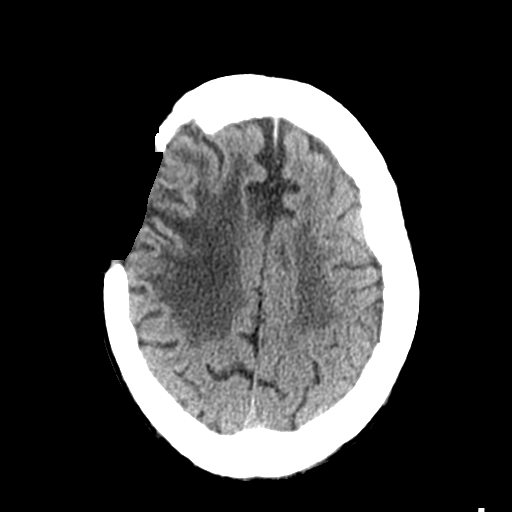
[im 19/30  bone]
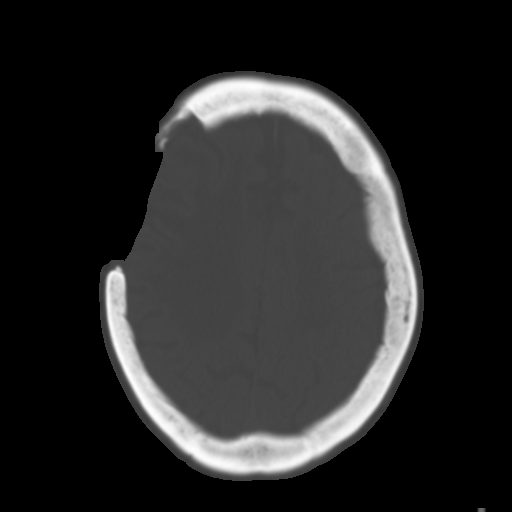
[im 22/30  brain]
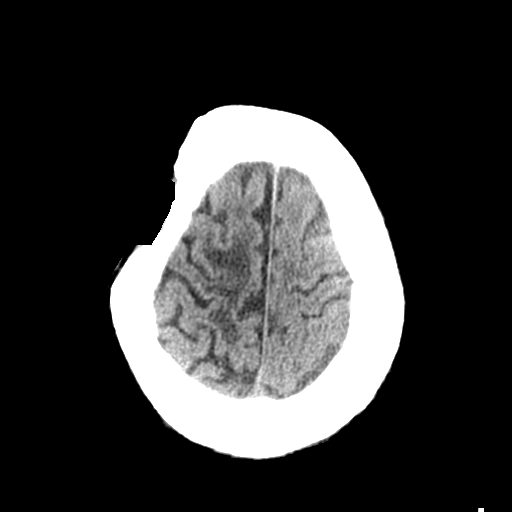
[im 26/30  brain]
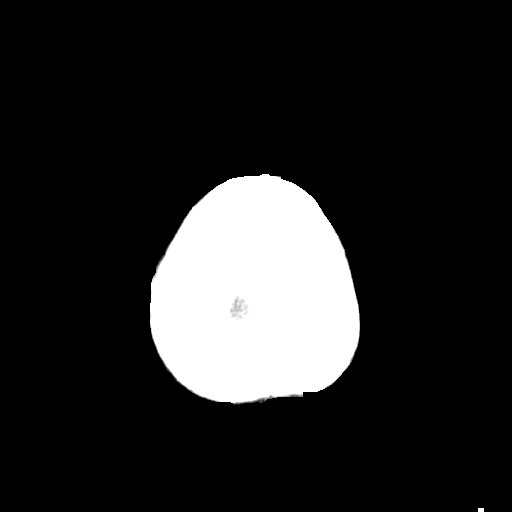

[Series 3: head bone · axial · 0.42mm/px · z∈[-127,-99]mm · 3 of 74 slices shown]
[im 8/74  bone]
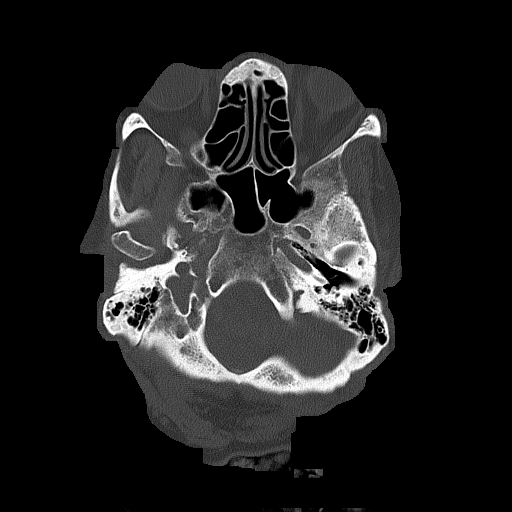
[im 15/74  bone]
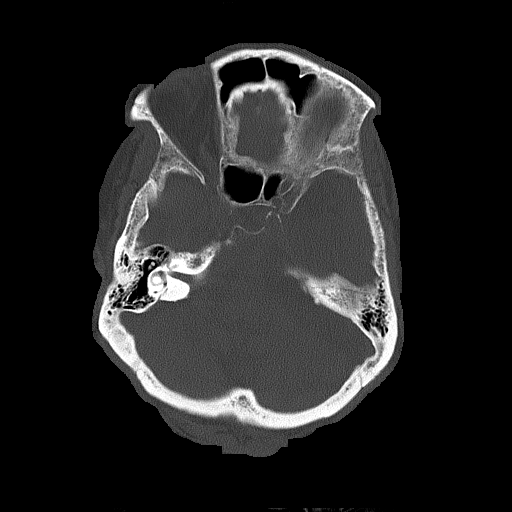
[im 22/74  bone]
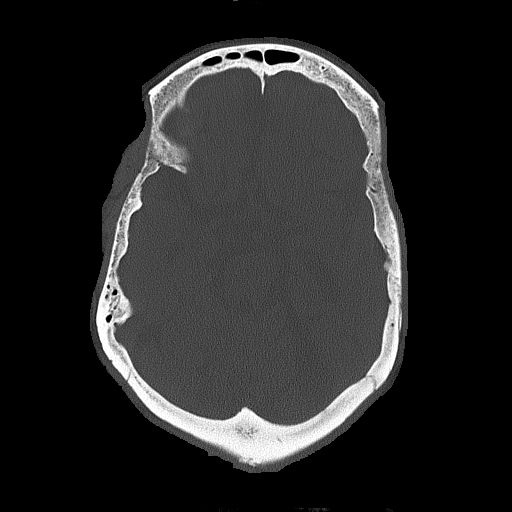

[Series 4: coronal soft tissue · coronal · 0.31mm/px · 3 of 66 slices shown]
[im 22/66  brain]
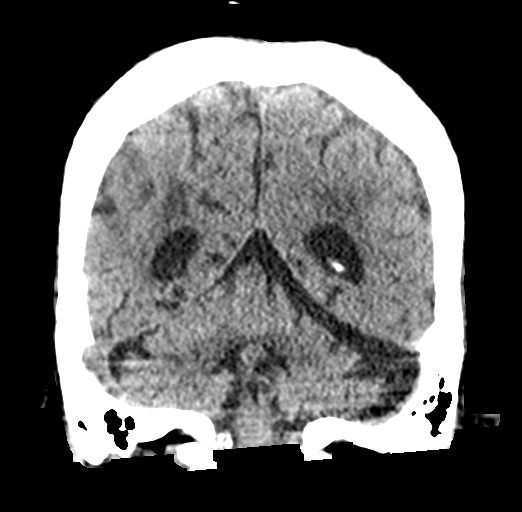
[im 29/66  brain]
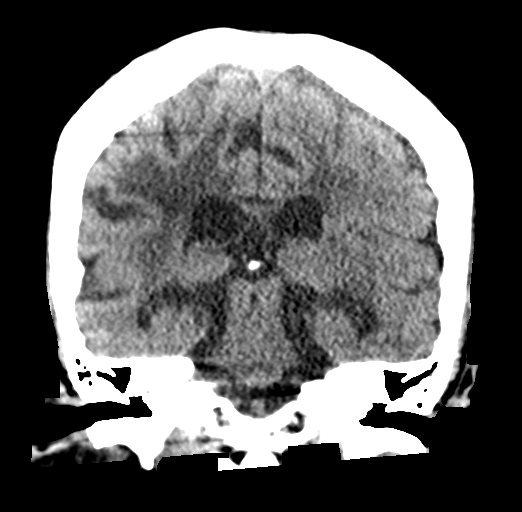
[im 37/66  brain]
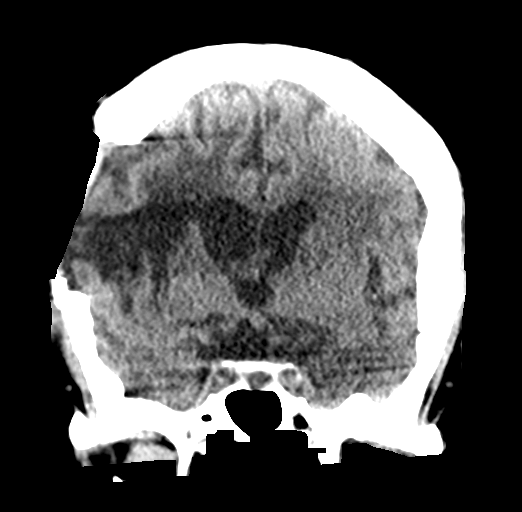

[Series 5: sagittal soft tissue · sagittal · 0.31mm/px · 3 of 55 slices shown]
[im 19/55  brain]
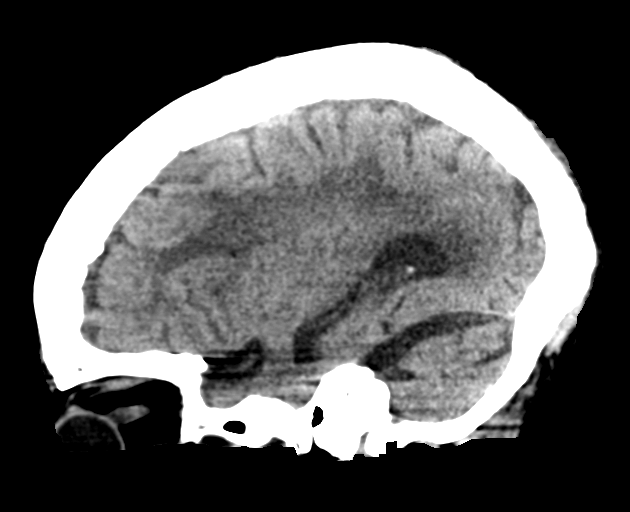
[im 28/55  brain]
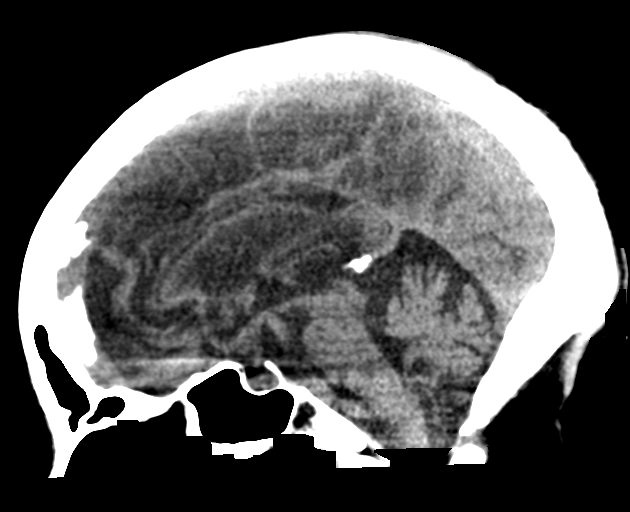
[im 37/55  brain]
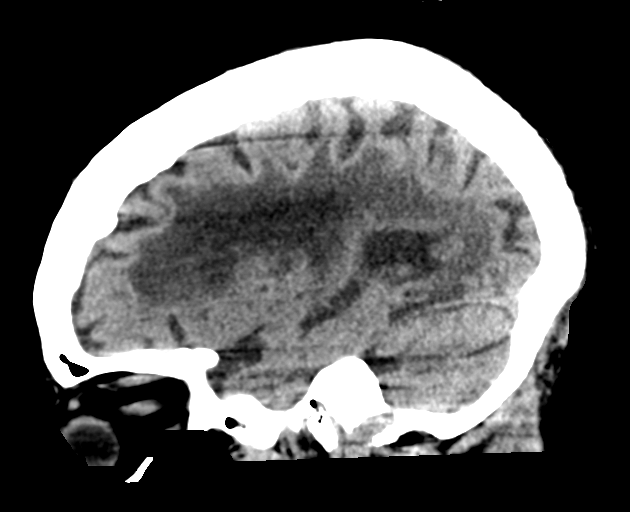

[16 of 47 positions shown; findings below may reference images not displayed]

FINDINGS: Brain:

Mild generalized cerebral and cerebellar atrophy.

Redemonstrated chronic cortical/subcortical infarct within the right
frontal operculum and right insula. Associated wallerian
degeneration affecting the right brainstem.

Redemonstrated chronic lacunar infarcts within the deep gray nuclei
bilaterally.

Background advanced patchy and ill-defined hypoattenuation within
the cerebral white matter, nonspecific but compatible with chronic
small vessel ischemic disease.

Redemonstrated small chronic infarcts within the bilateral
cerebellar hemispheres.

There is no acute intracranial hemorrhage.

No acute demarcated cortical infarct.

No extra-axial fluid collection.

No evidence of intracranial mass.

No midline shift.

Vascular: No hyperdense vessel.  Atherosclerotic calcifications.

Skull: Prior right craniotomy.  No calvarial fracture.

Sinuses/Orbits: Visualized orbits show no acute finding. No
significant paranasal sinus disease at the imaged levels.
IMPRESSION: No evidence of acute intracranial abnormality.

Redemonstrated chronic cortical/subcortical infarct within the right
frontal operculum and right insula with overlying craniotomy defect.
Associated Wallerian degeneration of the right brainstem.

Redemonstrated chronic lacunar infarcts within the deep gray nuclei
bilaterally.

Stable background severe cerebral white matter chronic small vessel
ischemic disease.

Redemonstrated chronic infarcts within the bilateral cerebellar
hemispheres.

Stable mild generalized parenchymal atrophy.

## 2021-04-14 MED ORDER — EPOETIN ALFA 10000 UNIT/ML IJ SOLN
4000.0000 [IU] | INTRAMUSCULAR | Status: DC
  Administered 2021-04-14 – 2021-04-18 (×3): 4000 [IU] via INTRAVENOUS
  Filled 2021-04-14: qty 1

## 2021-04-14 MED ORDER — METOPROLOL TARTRATE 5 MG/5ML IV SOLN
5.0000 mg | Freq: Once | INTRAVENOUS | Status: AC
  Administered 2021-04-14: 5 mg via INTRAVENOUS
  Filled 2021-04-14: qty 5

## 2021-04-14 NOTE — Progress Notes (Signed)
[name]  ROUNDING NOTE   Subjective:   Alert and confused Patient seen during breakfast Unable to feed self, staff assisting Denies pain  Objective:  Vital signs in last 24 hours:  Temp:  [97.8 F (36.6 C)-98.6 F (37 C)] 98.5 F (36.9 C) (05/10 0733) Pulse Rate:  [53-109] 91 (05/10 0733) Resp:  [12-29] 18 (05/10 0733) BP: (126-174)/(78-107) 161/84 (05/10 0733) SpO2:  [59 %-100 %] 99 % (05/10 0733) Weight:  [61 kg] 61 kg (05/10 0500)  Weight change: -6.26 kg Filed Weights   04/10/21 0500 04/13/21 0500 04/14/21 0500  Weight: 71.7 kg 67.3 kg 61 kg    Intake/Output: I/O last 3 completed shifts: In: 0  Out: 2050 [Urine:50; Other:2000]   Intake/Output this shift:  No intake/output data recorded.  Physical Exam: General: NAD, resting in bed  Head: Normocephalic, atraumatic. Moist oral mucosal membranes  Eyes: Anicteric  Lungs:  Clear   Heart: Irregular rhythm  Abdomen:  Soft, nontender,  distended  Extremities:  + peripheral edema.  Neurologic: Alert and oriented  Skin: No lesions  Access: RIJ permcath 5/6 Dr. Lucky Cowboy, Bleeding at exit site. Pressure dressings applied.     Basic Metabolic Panel: Recent Labs  Lab 04/09/21 1545 04/10/21 0753 04/13/21 1150  NA 137 136 138  K 4.9 3.9 2.9*  CL 108 100 99  CO2 17* 25 27  GLUCOSE 111* 67* 101*  BUN 79* 71* 13  CREATININE 7.99* 6.89* 2.02*  CALCIUM 7.3* 6.2* 8.3*  PHOS  --   --  2.2*    Liver Function Tests: Recent Labs  Lab 04/10/21 0753 04/13/21 1150  AST 11*  --   ALT 9  --   ALKPHOS 53  --   BILITOT 0.5  --   PROT 5.6*  --   ALBUMIN 2.5* 3.2*   No results for input(s): LIPASE, AMYLASE in the last 168 hours. No results for input(s): AMMONIA in the last 168 hours.  CBC: Recent Labs  Lab 04/09/21 1545 04/10/21 0753 04/11/21 0758 04/13/21 1150  WBC 10.6* 7.4  --  15.1*  HGB 8.1* 6.1* 6.8* 9.1*  HCT 24.8* 18.6* 20.4* 27.1*  MCV 92.2 90.7  --  85.8  PLT 246 179  --  202     Cardiac Enzymes: No results for input(s): CKTOTAL, CKMB, CKMBINDEX, TROPONINI in the last 168 hours.  BNP: Invalid input(s): POCBNP  CBG: Recent Labs  Lab 04/12/21 2153 04/13/21 0739 04/13/21 1615 04/13/21 2058 04/14/21 0724  GLUCAP 94 115* 93 127* 82    Microbiology: Results for orders placed or performed during the hospital encounter of 04/09/21  Resp Panel by RT-PCR (Flu A&B, Covid) Nasopharyngeal Swab     Status: None   Collection Time: 04/09/21  5:58 PM   Specimen: Nasopharyngeal Swab; Nasopharyngeal(NP) swabs in vial transport medium  Result Value Ref Range Status   SARS Coronavirus 2 by RT PCR NEGATIVE NEGATIVE Final    Comment: (NOTE) SARS-CoV-2 target nucleic acids are NOT DETECTED.  The SARS-CoV-2 RNA is generally detectable in upper respiratory specimens during the acute phase of infection. The lowest concentration of SARS-CoV-2 viral copies this assay can detect is 138 copies/mL. A negative result does not preclude SARS-Cov-2 infection and should not be used as the sole basis for treatment or other patient management decisions. A negative result may occur with  improper specimen collection/handling, submission of specimen other than nasopharyngeal swab, presence of viral mutation(s) within the areas targeted by this assay, and inadequate number of  viral copies(<138 copies/mL). A negative result must be combined with clinical observations, patient history, and epidemiological information. The expected result is Negative.  Fact Sheet for Patients:  EntrepreneurPulse.com.au  Fact Sheet for Healthcare Providers:  IncredibleEmployment.be  This test is no t yet approved or cleared by the [place] FDA and  has been authorized for detection and/or diagnosis of SARS-CoV-2 by FDA under an Emergency Use Authorization (EUA). This EUA will remain  in effect (meaning this test can be used) for the duration of the COVID-19  declaration under Section 564(b)(1) of the Act, 21 U.S.C.section 360bbb-3(b)(1), unless the authorization is terminated  or revoked sooner.       Influenza A by PCR NEGATIVE NEGATIVE Final   Influenza B by PCR NEGATIVE NEGATIVE Final    Comment: (NOTE) The Xpert Xpress SARS-CoV-2/FLU/RSV plus assay is intended as an aid in the diagnosis of influenza from Nasopharyngeal swab specimens and should not be used as a sole basis for treatment. Nasal washings and aspirates are unacceptable for Xpert Xpress SARS-CoV-2/FLU/RSV testing.  Fact Sheet for Patients: EntrepreneurPulse.com.au  Fact Sheet for Healthcare Providers: IncredibleEmployment.be  This test is not yet approved or cleared by the [place] FDA and has been authorized for detection and/or diagnosis of SARS-CoV-2 by FDA under an Emergency Use Authorization (EUA). This EUA will remain in effect (meaning this test can be used) for the duration of the COVID-19 declaration under Section 564(b)(1) of the Act, 21 U.S.C. section 360bbb-3(b)(1), unless the authorization is terminated or revoked.  Performed at [facility], [address] [place], [place] [address]     Coagulation Studies: No results for input(s): LABPROT, INR in the last 72 hours.  Urinalysis: No results for input(s): COLORURINE, LABSPEC, PHURINE, GLUCOSEU, HGBUR, BILIRUBINUR, KETONESUR, PROTEINUR, UROBILINOGEN, NITRITE, LEUKOCYTESUR in the last 72 hours.  Invalid input(s): APPERANCEUR    Imaging: No results found.   Medications:    . amLODipine  10 mg Oral Daily  . Chlorhexidine Gluconate Cloth  6 each Topical Q0600  . cholecalciferol  1,000 Units Oral Daily  . [START ON 04/15/2021] cloNIDine  0.2 mg Transdermal Weekly  . heparin  5,000 Units Subcutaneous Q8H  . insulin aspart  0-5 Units Subcutaneous QHS  . insulin aspart  0-9 Units Subcutaneous TID WC  . latanoprost  1 drop Both Eyes QHS  .  letrozole  2.5 mg Oral Daily  . levETIRAcetam  500 mg Oral Daily  . levothyroxine  112 mcg Oral Q0600  . metoprolol succinate  50 mg Oral Daily   acetaminophen **OR** acetaminophen, calcium carbonate, camphor-menthol **AND** hydrOXYzine, docusate sodium, feeding supplement (NEPRO CARB STEADY), hydrALAZINE, LORazepam, ondansetron **OR** ondansetron (ZOFRAN) IV, sorbitol  Assessment/ Plan:  Ms. AFIFA TRUAX is a 75 y.o.  female  with past medical history of GERD, Hypertension, diabetes, hyperlipidemia, CVA and CKD stage 4. She presents to the ED with increased lower extremity edema. She is known to our team from previous admissions.   1. End Stage Renal Disease requiring new start hemodialysis:  First dialysis treatment 5/6.   -Received third treatment yesterday - UF goal 2L acheived - Scheduled to receive dialysis today - 4K bath - Outpatient planning: Dialysis liaison notified of patient and will work with team to determine outpatient needs and placement - CT head to evaluate mental status changes  2. Anemia of chronic kidney disease  Lab Results  Component Value Date   HGB 9.1 (L) 04/13/2021  Rechecking labs today with dialysis EPO will begin  with treatments   3. Secondary Hyperparathyroidism: with hypocalcemia   Lab Results  Component Value Date   PTH 161 (H) 01/18/2021   CALCIUM 8.3 (L) 04/13/2021   PHOS 2.2 (L) 04/13/2021  Phosphorus below target Not currently on an vitamin D agent.   4.Diabetes mellitus type II with chronic kidney disease noninsulin dependent.  hemoglobin A1c is 5.7 on 01/18/21.  Stable at this time  5. Hypertension: elevated with volume overload. Home regimen of clonidine, amlodipine, metoprolol.    LOS: 5   5/10/20229:34 AM

## 2021-04-14 NOTE — Plan of Care (Signed)

## 2021-04-14 NOTE — Progress Notes (Signed)
[facility] NAME: Kirsten Mcmahon    MR#:  [id number]  DATE OF BIRTH:  Oct 22, 1946  SUBJECTIVE:   Much calm and pleasant today. Ate good BF  Pulled 2 IV sites yday per RN. Would NOT allow to place IV. REVIEW OF SYSTEMS:   Review of Systems  Constitutional: Negative for chills, fever and weight loss.  HENT: Negative for ear discharge, ear pain and nosebleeds.   Eyes: Negative for blurred vision, pain and discharge.  Respiratory: Negative for sputum production, shortness of breath, wheezing and stridor.   Cardiovascular: Negative for chest pain, palpitations, orthopnea and PND.  Gastrointestinal: Negative for abdominal pain, diarrhea, nausea and vomiting.  Genitourinary: Negative for frequency and urgency.  Musculoskeletal: Negative for back pain and joint pain.  Neurological: Positive for weakness. Negative for sensory change, speech change and focal weakness.  Psychiatric/Behavioral: Negative for depression and hallucinations. The patient is not nervous/anxious.    Tolerating Diet: yes Tolerating PT: HHPT  DRUG ALLERGIES:   Allergies  Allergen Reactions  . Contrast Media [Iodinated Diagnostic Agents]     Other reaction(s): NO ALLERGY  . Latex     Other reaction(s): NO ALLERGY  . Shellfish-Derived Products     Other reaction(s): NO ALLERGY  . Levemir [Insulin Detemir] Itching    VITALS:  Blood pressure (!) 161/84, pulse 91, temperature 98.5 F (36.9 C), resp. rate 18, height 5\' 2"  (1.575 m), weight 61 kg, SpO2 99 %.  PHYSICAL EXAMINATION:   Physical Exam  GENERAL:  75 y.o.-year-old patient lying in the bed with no acute distress.Mornid obesity  Pallor+ HEENT: Head atraumatic, normocephalic. Oropharynx and nasopharynx clear.  Right IJ cath + LUNGS: Normal breath sounds bilaterally, no wheezing, rales, rhonchi. No use of accessory muscles of respiration.  CARDIOVASCULAR: S1, S2 normal. No murmurs, rubs, or gallops.   ABDOMEN: Soft, nontender, nondistended. Bowel sounds present. No organomegaly or mass.  EXTREMITIES: Left LE swelling decreasing    NEUROLOGIC: left UE chronic weakness for CVA.  PSYCHIATRIC:  patient is alert and oriented x 3.  SKIN: No obvious rash, lesion, or ulcer.   LABORATORY PANEL:  CBC Recent Labs  Lab 04/13/21 1150  WBC 15.1*  HGB 9.1*  HCT 27.1*  PLT 202    Chemistries  Recent Labs  Lab 04/10/21 0753 04/13/21 1150  NA 136 138  K 3.9 2.9*  CL 100 99  CO2 25 27  GLUCOSE 67* 101*  BUN 71* 13  CREATININE 6.89* 2.02*  CALCIUM 6.2* 8.3*  AST 11*  --   ALT 9  --   ALKPHOS 53  --   BILITOT 0.5  --    Cardiac Enzymes No results for input(s): TROPONINI in the last 168 hours. RADIOLOGY:  No results found. ASSESSMENT AND PLAN:   Kirsten Mcmahon is a 75 y.o. female with medical history significant of chronic kidney disease stage IV, GERD, hypertension, hypothyroidism, non-insulin-dependent diabetes, hyperlipidemia, previous CVA and overactive bladder who has been seeing nephrology but not yet ready for hemodialysis. She noticed worsening lower extremities edema left more than right.   Acute on chronic chronic kidney disease stage IV--now progressed to to ESRD Confusion/Acute Delirium -- Patient appears to have medical renal disease at this point.  -- Nephrology Dr Juleen China input appreciated. Patient had right IJ HD access place by Dr. Lucky Cowboy. --5/6-- first HD treatment --5/7-- bleeding from vascular site. Vascular on-call aware. Follow recommendations. -5/8 worked with PT, right IJ cath site  no bleeding --5/9-- patient confused. Received Ativan yesterday. Per Dr. Juleen China patient to get dialysis sitting out in the chair. Outpatient dialysis set up in process. --5/10--mentation much better today--calm and pleasant.   Left Lower extremity edema: Left greater than right.   -- lower extremity Doppler negative for DVT unclear reason for unilateral edema  Essential  hypertension: -- amlodipine, metoprolol, clonidine patch --prn hydralazine  anemia of chronic disease: Most likely due to chronic renal disease. -- 5/7-- got 1 unit blood transfusion with dialysis  --5/8--hgb 9.1  Type 2 diabetes: Sliding scale insulin. --sugars in the 63--93  Hypothyroidism:  --on levothyroxine.   GERD: PPIs   DVT prophylaxis: Heparin Code Status: Full code Family Communication:  left message for son [name] on 5/8 Disposition Plan: TBD Consults called:  nephrology Admission status: Inpatient  Procedures:Right IJ HD access Level of care: Med-Surg  Remains inpatient appropriate because:Inpatient level of care appropriate due to severity of illness   Patient  started on hemodialysis for new diagnosis of end-stage renal disease. PT to see pt  TOC for d/c planning for outpt HD and PACE program. Pt will remain till out pt HD chair time is established  TOTAL TIME TAKING CARE OF THIS PATIENT: *25* minutes.  >50% time spent on counselling and coordination of care  Note: This dictation was prepared with Dragon dictation along with smaller phrase technology. Any transcriptional errors that result from this process are unintentional.  Fritzi Mandes M.D    [facility]   CC: Primary care physician; [facility] IncPatient ID: Kirsten Mcmahon, female   DOB: 08/22/1946, 75 y.o.   MRN: [id number]

## 2021-04-14 NOTE — Progress Notes (Addendum)
PT Cancellation Note  Patient Details Name: Kirsten Mcmahon MRN: [id number] DOB: [birth year]   Cancelled Treatment:     PT attempt. Pt out of room for CT. Acute PT will continue efforts to treat when available. 1106am  1508: Pt off floor in HD. Acute PT will continue efforts to treat when pt is available.    Willette Pa 04/14/2021, 11:06 AM

## 2021-04-14 NOTE — Progress Notes (Signed)
   04/14/21 1923  Assess: MEWS Score  Temp 98 F (36.7 C)  BP (!) 152/98  Pulse Rate (!) 114  Resp 18  SpO2 99 %  O2 Device Room Air  Assess: MEWS Score  MEWS Temp 0  MEWS Systolic 0  MEWS Pulse 2  MEWS RR 0  MEWS LOC 0  MEWS Score 2  MEWS Score Color Yellow  Assess: if the MEWS score is Yellow or Red  Were vital signs taken at a resting state? Yes  Focused Assessment No change from prior assessment  Early Detection of Sepsis Score *See Row Information* Medium  MEWS guidelines implemented *See Row Information* No, previously yellow, continue vital signs every 4 hours  Treat  MEWS Interventions Other (Comment)  Pain Scale 0-10  Take Vital Signs  Increase Vital Sign Frequency  Yellow: Q 2hr X 2 then Q 4hr X 2, if remains yellow, continue Q 4hrs  Escalate  MEWS: Escalate Yellow: discuss with charge nurse/RN and consider discussing with provider and RRT  Notify: Charge Nurse/RN  Name of Charge Nurse/RN Notified Celine  Date Charge Nurse/RN Notified 04/14/21  Time Charge Nurse/RN Notified 1923  Notify: Provider  Provider Name/Title Patel  Date Provider Notified 04/14/21  Notification Type Page

## 2021-04-15 ENCOUNTER — Encounter: Payer: Self-pay | Admitting: Internal Medicine

## 2021-04-15 DIAGNOSIS — Z992 Dependence on renal dialysis: Secondary | ICD-10-CM | POA: Diagnosis not present

## 2021-04-15 DIAGNOSIS — N186 End stage renal disease: Secondary | ICD-10-CM | POA: Diagnosis not present

## 2021-04-15 DIAGNOSIS — I10 Essential (primary) hypertension: Secondary | ICD-10-CM | POA: Diagnosis not present

## 2021-04-15 LAB — BASIC METABOLIC PANEL
Anion gap: 10 (ref 5–15)
BUN: 11 mg/dL (ref 8–23)
CO2: 27 mmol/L (ref 22–32)
Calcium: 7.9 mg/dL — ABNORMAL LOW (ref 8.9–10.3)
Chloride: 100 mmol/L (ref 98–111)
Creatinine, Ser: 3 mg/dL — ABNORMAL HIGH (ref 0.44–1.00)
GFR, Estimated: 16 mL/min — ABNORMAL LOW (ref >60)
Glucose, Bld: 125 mg/dL — ABNORMAL HIGH (ref 70–99)
Potassium: 3.4 mmol/L — ABNORMAL LOW (ref 3.5–5.1)
Sodium: 137 mmol/L (ref 135–145)

## 2021-04-15 LAB — MAGNESIUM: Magnesium: 1.8 mg/dL (ref 1.7–2.4)

## 2021-04-15 LAB — CBC WITH DIFFERENTIAL/PLATELET
Abs Immature Granulocytes: 0.05 10*3/uL (ref 0.00–0.07)
Basophils Absolute: 0 10*3/uL (ref 0.0–0.1)
Basophils Relative: 0 %
Eosinophils Absolute: 0.3 10*3/uL (ref 0.0–0.5)
Eosinophils Relative: 3 %
HCT: 27 % — ABNORMAL LOW (ref 36.0–46.0)
Hemoglobin: 9.1 g/dL — ABNORMAL LOW (ref 12.0–15.0)
Immature Granulocytes: 0 %
Lymphocytes Relative: 11 %
Lymphs Abs: 1.2 10*3/uL (ref 0.7–4.0)
MCH: 29.7 pg (ref 26.0–34.0)
MCHC: 33.7 g/dL (ref 30.0–36.0)
MCV: 88.2 fL (ref 80.0–100.0)
Monocytes Absolute: 1 10*3/uL (ref 0.1–1.0)
Monocytes Relative: 9 %
Neutro Abs: 8.5 10*3/uL — ABNORMAL HIGH (ref 1.7–7.7)
Neutrophils Relative %: 77 %
Platelets: 206 10*3/uL (ref 150–400)
RBC: 3.06 MIL/uL — ABNORMAL LOW (ref 3.87–5.11)
RDW: 16.7 % — ABNORMAL HIGH (ref 11.5–15.5)
WBC: 11.2 10*3/uL — ABNORMAL HIGH (ref 4.0–10.5)
nRBC: 0 % (ref 0.0–0.2)

## 2021-04-15 MED ORDER — CLONIDINE HCL 0.3 MG/24HR TD PTWK: 0.3000 mg | MEDICATED_PATCH | TRANSDERMAL | Status: DC

## 2021-04-15 MED ORDER — CLONIDINE HCL 0.3 MG/24HR TD PTWK
0.3000 mg | MEDICATED_PATCH | TRANSDERMAL | Status: DC
  Administered 2021-04-15 – 2021-04-22 (×2): 0.3 mg via TRANSDERMAL
  Filled 2021-04-15 (×2): qty 1

## 2021-04-15 MED ORDER — BISACODYL 10 MG RE SUPP
10.0000 mg | Freq: Once | RECTAL | Status: AC
  Administered 2021-04-15: 10 mg via RECTAL
  Filled 2021-04-15: qty 1

## 2021-04-15 MED ORDER — ASPIRIN 300 MG RE SUPP
300.0000 mg | Freq: Every day | RECTAL | Status: DC
  Administered 2021-04-18 – 2021-04-19 (×2): 300 mg via RECTAL
  Filled 2021-04-15 (×7): qty 1

## 2021-04-15 MED ORDER — LEVETIRACETAM IN NACL 500 MG/100ML IV SOLN
500.0000 mg | INTRAVENOUS | Status: DC
  Administered 2021-04-15 – 2021-04-17 (×3): 500 mg via INTRAVENOUS
  Filled 2021-04-15 (×4): qty 100

## 2021-04-15 MED ORDER — SODIUM PHOSPHATES 45 MMOLE/15ML IV SOLN
20.0000 mmol | Freq: Once | INTRAVENOUS | Status: AC
  Administered 2021-04-15: 20 mmol via INTRAVENOUS
  Filled 2021-04-15: qty 6.67

## 2021-04-15 NOTE — Progress Notes (Addendum)
[name]  ROUNDING NOTE   Subjective:   Alert and confused Patient seen during breakfast Unable to verbalize location today Denies pain  UOP 779ml  Objective:  Vital signs in last 24 hours:  Temp:  [97.9 F (36.6 C)-99.1 F (37.3 C)] 99.1 F (37.3 C) (05/11 0818) Pulse Rate:  [95-120] 100 (05/11 0818) Resp:  [13-23] 20 (05/11 0818) BP: (152-199)/(67-139) 161/90 (05/11 0818) SpO2:  [95 %-100 %] 100 % (05/11 0818) Weight:  [59.9 kg] 59.9 kg (05/11 0500)  Weight change: -1.109 kg Filed Weights   04/13/21 0500 04/14/21 0500 04/15/21 0500  Weight: 67.3 kg 61 kg 59.9 kg    Intake/Output: I/O last 3 completed shifts: In: 120 [P.O.:120] Out: 3200 [Urine:700; Other:2500]   Intake/Output this shift:  No intake/output data recorded.  Physical Exam: General: NAD, resting in bed  Head: Normocephalic, atraumatic. Moist oral mucosal membranes  Eyes: Anicteric  Lungs:  Diminished in bases  Heart: Irregular rhythm  Abdomen:  Soft, nontender,  distended  Extremities:  + peripheral edema.  Neurologic: Alert and oriented  Skin: No lesions  Access: RIJ permcath 5/6 Dr. Lucky Cowboy    Basic Metabolic Panel: Recent Labs  Lab 04/09/21 1545 04/10/21 0753 04/13/21 1150  NA 137 136 138  K 4.9 3.9 2.9*  CL 108 100 99  CO2 17* 25 27  GLUCOSE 111* 67* 101*  BUN 79* 71* 13  CREATININE 7.99* 6.89* 2.02*  CALCIUM 7.3* 6.2* 8.3*  PHOS  --   --  2.2*    Liver Function Tests: Recent Labs  Lab 04/10/21 0753 04/13/21 1150  AST 11*  --   ALT 9  --   ALKPHOS 53  --   BILITOT 0.5  --   PROT 5.6*  --   ALBUMIN 2.5* 3.2*   No results for input(s): LIPASE, AMYLASE in the last 168 hours. No results for input(s): AMMONIA in the last 168 hours.  CBC: Recent Labs  Lab 04/09/21 1545 04/10/21 0753 04/11/21 0758 04/13/21 1150  WBC 10.6* 7.4  --  15.1*  HGB 8.1* 6.1* 6.8* 9.1*  HCT 24.8* 18.6* 20.4* 27.1*  MCV 92.2 90.7  --  85.8  PLT 246 179  --  202    Cardiac  Enzymes: No results for input(s): CKTOTAL, CKMB, CKMBINDEX, TROPONINI in the last 168 hours.  BNP: Invalid input(s): POCBNP  CBG: Recent Labs  Lab 04/13/21 1615 04/13/21 2058 04/14/21 0724 04/14/21 1124 04/14/21 2144  GLUCAP 93 127* 82 126* 95    Microbiology: Results for orders placed or performed during the hospital encounter of 04/09/21  Resp Panel by RT-PCR (Flu A&B, Covid) Nasopharyngeal Swab     Status: None   Collection Time: 04/09/21  5:58 PM   Specimen: Nasopharyngeal Swab; Nasopharyngeal(NP) swabs in vial transport medium  Result Value Ref Range Status   SARS Coronavirus 2 by RT PCR NEGATIVE NEGATIVE Final    Comment: (NOTE) SARS-CoV-2 target nucleic acids are NOT DETECTED.  The SARS-CoV-2 RNA is generally detectable in upper respiratory specimens during the acute phase of infection. The lowest concentration of SARS-CoV-2 viral copies this assay can detect is 138 copies/mL. A negative result does not preclude SARS-Cov-2 infection and should not be used as the sole basis for treatment or other patient management decisions. A negative result may occur with  improper specimen collection/handling, submission of specimen other than nasopharyngeal swab, presence of viral mutation(s) within the areas targeted by this assay, and inadequate number of viral copies(<138 copies/mL). A negative  result must be combined with clinical observations, patient history, and epidemiological information. The expected result is Negative.  Fact Sheet for Patients:  EntrepreneurPulse.com.au  Fact Sheet for Healthcare Providers:  IncredibleEmployment.be  This test is no t yet approved or cleared by the [place] FDA and  has been authorized for detection and/or diagnosis of SARS-CoV-2 by FDA under an Emergency Use Authorization (EUA). This EUA will remain  in effect (meaning this test can be used) for the duration of the COVID-19 declaration  under Section 564(b)(1) of the Act, 21 U.S.C.section 360bbb-3(b)(1), unless the authorization is terminated  or revoked sooner.       Influenza A by PCR NEGATIVE NEGATIVE Final   Influenza B by PCR NEGATIVE NEGATIVE Final    Comment: (NOTE) The Xpert Xpress SARS-CoV-2/FLU/RSV plus assay is intended as an aid in the diagnosis of influenza from Nasopharyngeal swab specimens and should not be used as a sole basis for treatment. Nasal washings and aspirates are unacceptable for Xpert Xpress SARS-CoV-2/FLU/RSV testing.  Fact Sheet for Patients: EntrepreneurPulse.com.au  Fact Sheet for Healthcare Providers: IncredibleEmployment.be  This test is not yet approved or cleared by the [place] FDA and has been authorized for detection and/or diagnosis of SARS-CoV-2 by FDA under an Emergency Use Authorization (EUA). This EUA will remain in effect (meaning this test can be used) for the duration of the COVID-19 declaration under Section 564(b)(1) of the Act, 21 U.S.C. section 360bbb-3(b)(1), unless the authorization is terminated or revoked.  Performed at [facility], [address] [place], [place] [address]     Coagulation Studies: No results for input(s): LABPROT, INR in the last 72 hours.  Urinalysis: No results for input(s): COLORURINE, LABSPEC, PHURINE, GLUCOSEU, HGBUR, BILIRUBINUR, KETONESUR, PROTEINUR, UROBILINOGEN, NITRITE, LEUKOCYTESUR in the last 72 hours.  Invalid input(s): APPERANCEUR    Imaging: CT HEAD WO CONTRAST  Result Date: 04/14/2021 CLINICAL DATA:  Mental status change, unknown cause. Additional history provided: Mental status change, past history of cerebral aneurysm repair, seizure and stroke. EXAM: CT HEAD WITHOUT CONTRAST TECHNIQUE: Contiguous axial images were obtained from the base of the skull through the vertex without intravenous contrast. COMPARISON:  Brain MRI 01/17/2021.  Head CT 01/17/2021. FINDINGS:  Brain: Mild generalized cerebral and cerebellar atrophy. Redemonstrated chronic cortical/subcortical infarct within the right frontal operculum and right insula. Associated wallerian degeneration affecting the right brainstem. Redemonstrated chronic lacunar infarcts within the deep gray nuclei bilaterally. Background advanced patchy and ill-defined hypoattenuation within the cerebral white matter, nonspecific but compatible with chronic small vessel ischemic disease. Redemonstrated small chronic infarcts within the bilateral cerebellar hemispheres. There is no acute intracranial hemorrhage. No acute demarcated cortical infarct. No extra-axial fluid collection. No evidence of intracranial mass. No midline shift. Vascular: No hyperdense vessel.  Atherosclerotic calcifications. Skull: Prior right craniotomy.  No calvarial fracture. Sinuses/Orbits: Visualized orbits show no acute finding. No significant paranasal sinus disease at the imaged levels. IMPRESSION: No evidence of acute intracranial abnormality. Redemonstrated chronic cortical/subcortical infarct within the right frontal operculum and right insula with overlying craniotomy defect. Associated Wallerian degeneration of the right brainstem. Redemonstrated chronic lacunar infarcts within the deep gray nuclei bilaterally. Stable background severe cerebral white matter chronic small vessel ischemic disease. Redemonstrated chronic infarcts within the bilateral cerebellar hemispheres. Stable mild generalized parenchymal atrophy. Electronically Signed   By: Kellie Simmering DO   On: 04/14/2021 13:37     Medications:    . amLODipine  10 mg Oral Daily  . Chlorhexidine Gluconate Cloth  6 each  Topical Q0600  . cholecalciferol  1,000 Units Oral Daily  . cloNIDine  0.2 mg Transdermal Weekly  . epoetin (EPOGEN/PROCRIT) injection  4,000 Units Intravenous Q T,Th,Sa-HD  . heparin  5,000 Units Subcutaneous Q8H  . latanoprost  1 drop Both Eyes QHS  . letrozole  2.5 mg  Oral Daily  . levETIRAcetam  500 mg Oral Daily  . levothyroxine  112 mcg Oral Q0600  . metoprolol succinate  50 mg Oral Daily   acetaminophen **OR** acetaminophen, calcium carbonate, camphor-menthol **AND** hydrOXYzine, docusate sodium, feeding supplement (NEPRO CARB STEADY), hydrALAZINE, LORazepam, ondansetron **OR** ondansetron (ZOFRAN) IV, sorbitol  Assessment/ Plan:  Kirsten Mcmahon is a 75 y.o.  female  with past medical history of GERD, Hypertension, diabetes, hyperlipidemia, CVA and CKD stage 4. She presents to the ED with increased lower extremity edema. She is known to our team from previous admissions.   1. End Stage Renal Disease requiring new start hemodialysis:  First dialysis treatment 5/6.   -Received dialysis yesterday - UF goal 2.5L acheived - 4K bath - Outpatient planning: Dialysis liaison notified of patient and will work with team to determine outpatient needs and placement - CT head no acute changes  2. Anemia of chronic kidney disease  Lab Results  Component Value Date   HGB 9.1 (L) 04/13/2021   EPO will begin with treatments   3. Secondary Hyperparathyroidism: with hypocalcemia   Lab Results  Component Value Date   PTH 161 (H) 01/18/2021   CALCIUM 8.3 (L) 04/13/2021   PHOS 2.2 (L) 04/13/2021  Phosphorus below target Poor oral intake Sodium Phosphare 20 mmol  4.Diabetes mellitus type II with chronic kidney disease noninsulin dependent.  hemoglobin A1c is 5.7 on 01/18/21.  Stable at this time  5. Hypertension: elevated with volume overload. Home regimen of clonidine, amlodipine, metoprolol.    LOS: 6   5/11/20229:38 AM

## 2021-04-15 NOTE — Progress Notes (Signed)
PT Cancellation Note  Patient Details Name: Kirsten Mcmahon MRN: [id number] DOB: [birth year]   Cancelled Treatment:     PT attempt. Hold per Rn request. Pt is currently sleeping and has been resistive to all meds and medical management this date. RN requested therapist not wake pt up currently. Will return later and continue to follow per POC.     Willette Pa 04/15/2021, 10:29 AM

## 2021-04-15 NOTE — Progress Notes (Signed)
HR still elevated at 115, BP- 168/91; Oncall provider informed and ordered to give metoprolol IV.

## 2021-04-15 NOTE — TOC Progression Note (Signed)
Transition of Care [facility]) - Progression Note    Patient Details  Name: Kirsten Mcmahon MRN: [id number] Date of Birth: 01-28-1946  Transition of Care [facility]) CM/SW [place], LCSW Phone Number: 04/15/2021, 9:39 AM  Clinical Narrative:  Spoke with Denisha at [facility]. They are working on getting her set up to come to [facility] a couple of days per week for therapy. They will discuss with son. HD placement still pending. [facility] is in network with [facility] on [address] in [place] and want her there. HD coordinator is aware but concerned son won't be able to transport that far.  Expected Discharge Plan: [place] Barriers to Discharge: Continued Medical Work up  Expected Discharge Plan and Services Expected Discharge Plan: [place] arrangements for the past 2 months: Single Family Home                                       Social Determinants of Health (SDOH) Interventions    Readmission Risk Interventions Readmission Risk Prevention Plan 04/11/2021  Transportation Screening Complete  PCP or Specialist Appt within 3-5 Days Complete  HRI or [place] Complete  Social Work Consult for [place] Planning/Counseling Complete  Palliative Care Screening Not Applicable  Medication Review Press photographer) Complete  Some recent data might be hidden

## 2021-04-15 NOTE — Plan of Care (Addendum)
  Problem: Clinical Measurements: Goal: Ability to maintain clinical measurements within normal limits will improve Outcome: Progressing Goal: Will remain free from infection Outcome: Progressing Goal: Diagnostic test results will improve Outcome: Progressing Goal: Respiratory complications will improve Outcome: Progressing Goal: Cardiovascular complication will be avoided Outcome: Progressing   Pt is very confused and uncooperative. Oriented to self only. Refused to take her morning medicine.  BP-153/85, HR-106, afebrile. V/S monitored for previous yellow mews. Complained of pain on both leg and left arm.

## 2021-04-15 NOTE — Progress Notes (Addendum)
Progress Note    Kirsten Mcmahon  HMC:[id number] DOB: [birth year]  DOA: 04/09/2021 PCP: [place]      Brief Narrative:    Medical records reviewed and are as summarized below:  Kirsten Mcmahon is a 75 y.o. female with medical history significant for CKD stage V, hypertension, hypothyroidism, diabetes mellitus, hyperlipidemia, history of stroke, seizure disorder, overactive bladder.  She presented to the hospital because of increased swelling of the lower extremities despite taking diuretics.  In the emergency room, she was noted to have a creatinine of 7.99 which was much higher than her previous creatinine of 3.99 (2 months prior).  She was admitted to the hospital for AKI on CKD stage V.  She was evaluated by the nephrologist.  Vascular surgeon was consulted and a right IJ tunneled hemodialysis permacath was placed on 04/10/2021.  Hemodialysis was initiated on 04/10/2021.      Assessment/Plan:   Principal Problem:   Acute renal failure superimposed on stage 3b chronic kidney disease (HCC) Active Problems:   Hypertension   Hypothyroidism   GERD (gastroesophageal reflux disease)   Anemia in chronic kidney disease (CKD)   Type II diabetes mellitus with renal manifestations ([place])   ESRD needing dialysis ([place])   Body mass index is 24.15 kg/m.     ESRD: Hemodialysis was initiated on 04/10/2021.  She will continue hemodialysis as an outpatient.  Awaiting outpatient hemodialysis chair.  Confusion, suspected metabolic encephalopathy/delirium: Continue supportive care.  Hypokalemia: Improved.  Monitor electrolytes.  Left lower extremity edema: No evidence of DVT.  Anemia of chronic disease: S/p transfusion with 1 unit of PRBCs on 04/11/2021.  Seizure disorder: Change oral Keppra to IV Keppra because she is refusing to take oral medications  Hypertension: Increase clonidine patch from 0.2 mg to 0.3 mg weekly.  Type II DM: NovoLog as needed for  hyperglycemia.  CAD, history of stroke with left-sided weakness: Change oral aspirin to aspirin suppository.    Diet Order            Diet renal with fluid restriction Fluid restriction: 1200 mL Fluid; Room service appropriate? Yes; Fluid consistency: Thin  Diet effective now                    Consultants:  Vascular surgeon  Nephrologist  Procedures:  Right IJ permacath placed on 04/10/2021    Medications:   . amLODipine  10 mg Oral Daily  . aspirin  300 mg Rectal Daily  . bisacodyl  10 mg Rectal Once  . Chlorhexidine Gluconate Cloth  6 each Topical Q0600  . cholecalciferol  1,000 Units Oral Daily  . [START ON 04/22/2021] cloNIDine  0.3 mg Transdermal Weekly  . epoetin (EPOGEN/PROCRIT) injection  4,000 Units Intravenous Q T,Th,Sa-HD  . heparin  5,000 Units Subcutaneous Q8H  . latanoprost  1 drop Both Eyes QHS  . letrozole  2.5 mg Oral Daily  . levothyroxine  112 mcg Oral Q0600  . metoprolol succinate  50 mg Oral Daily   Continuous Infusions: . levETIRAcetam    . sodium phosphate  Dextrose 5% IVPB 20 mmol (04/15/21 1201)     Anti-infectives (From admission, onward)   Start     Dose/Rate Route Frequency Ordered Stop   04/11/21 0600  ceFAZolin (ANCEF) 1 g in sodium chloride 0.9 % 100 mL IVPB       Note to Pharmacy: To be given in specials   1 g 200 mL/hr over 30 Minutes  Intravenous On call to O.R. 04/10/21 1432 04/10/21 1546             Family Communication/Anticipated D/C date and plan/Code Status   DVT prophylaxis: heparin injection 5,000 Units Start: 04/09/21 2200     Code Status: Full Code  Family Communication: None Disposition Plan:    Status is: Inpatient  Remains inpatient appropriate because:IV treatments appropriate due to intensity of illness or inability to take PO and Inpatient level of care appropriate due to severity of illness   Dispo: The patient is from: Home              Anticipated d/c is to: Home              Patient  currently is not medically stable to d/c.   Difficult to place patient No           Subjective:   Interval events noted.  According to her nurse, she has been refusing oral medications.  Objective:    Vitals:   04/15/21 0319 04/15/21 0500 04/15/21 0818 04/15/21 1119  BP: (!) 153/85  (!) 161/90 (!) 157/98  Pulse: (!) 106  100 (!) 101  Resp: 18  20 16   Temp: 98.1 F (36.7 C)  99.1 F (37.3 C) 98.2 F (36.8 C)  TempSrc: Oral  Oral   SpO2: 95%  100% 100%  Weight:  59.9 kg    Height:       No data found.   Intake/Output Summary (Last 24 hours) at 04/15/2021 1243 Last data filed at 04/15/2021 1013 Gross per 24 hour  Intake 180 ml  Output 3200 ml  Net -3020 ml   Filed Weights   04/13/21 0500 04/14/21 0500 04/15/21 0500  Weight: 67.3 kg 61 kg 59.9 kg    Exam:  GEN: NAD SKIN: Warm and dry EYES: EOMI ENT: MMM CV: RRR PULM: CTA B ABD: soft, ND, NT, +BS CNS: AAO x 1 (person), she does not follow commands.  She does not move her left upper extremities and lower extremities. EXT: No edema or tenderness        Data Reviewed:   I have personally reviewed following labs and imaging studies:  Labs: Labs show the following:   Basic Metabolic Panel: Recent Labs  Lab 04/09/21 1545 04/10/21 0753 04/13/21 1150 04/15/21 1007  NA 137 136 138 137  K 4.9 3.9 2.9* 3.4*  CL 108 100 99 100  CO2 17* 25 27 27   GLUCOSE 111* 67* 101* 125*  BUN 79* 71* 13 11  CREATININE 7.99* 6.89* 2.02* 3.00*  CALCIUM 7.3* 6.2* 8.3* 7.9*  MG  --   --   --  1.8  PHOS  --   --  2.2*  --    GFR Estimated Creatinine Clearance: 12.6 mL/min (A) (by C-G formula based on SCr of 3 mg/dL (H)). Liver Function Tests: Recent Labs  Lab 04/10/21 0753 04/13/21 1150  AST 11*  --   ALT 9  --   ALKPHOS 53  --   BILITOT 0.5  --   PROT 5.6*  --   ALBUMIN 2.5* 3.2*   No results for input(s): LIPASE, AMYLASE in the last 168 hours. No results for input(s): AMMONIA in the last 168  hours. Coagulation profile No results for input(s): INR, PROTIME in the last 168 hours.  CBC: Recent Labs  Lab 04/09/21 1545 04/10/21 0753 04/11/21 0758 04/13/21 1150 04/15/21 1007  WBC 10.6* 7.4  --  15.1* 11.2*  NEUTROABS  --   --   --   --  8.5*  HGB 8.1* 6.1* 6.8* 9.1* 9.1*  HCT 24.8* 18.6* 20.4* 27.1* 27.0*  MCV 92.2 90.7  --  85.8 88.2  PLT 246 179  --  202 206   Cardiac Enzymes: No results for input(s): CKTOTAL, CKMB, CKMBINDEX, TROPONINI in the last 168 hours. BNP (last 3 results) No results for input(s): PROBNP in the last 8760 hours. CBG: Recent Labs  Lab 04/13/21 1615 04/13/21 2058 04/14/21 0724 04/14/21 1124 04/14/21 2144  GLUCAP 93 127* 82 126* 95   D-Dimer: No results for input(s): DDIMER in the last 72 hours. Hgb A1c: No results for input(s): HGBA1C in the last 72 hours. Lipid Profile: No results for input(s): CHOL, HDL, LDLCALC, TRIG, CHOLHDL, LDLDIRECT in the last 72 hours. Thyroid function studies: No results for input(s): TSH, T4TOTAL, T3FREE, THYROIDAB in the last 72 hours.  Invalid input(s): FREET3 Anemia work up: No results for input(s): VITAMINB12, FOLATE, FERRITIN, TIBC, IRON, RETICCTPCT in the last 72 hours. Sepsis Labs: Recent Labs  Lab 04/09/21 1545 04/10/21 0753 04/13/21 1150 04/15/21 1007  WBC 10.6* 7.4 15.1* 11.2*    Microbiology Recent Results (from the past 240 hour(s))  Resp Panel by RT-PCR (Flu A&B, Covid) Nasopharyngeal Swab     Status: None   Collection Time: 04/09/21  5:58 PM   Specimen: Nasopharyngeal Swab; Nasopharyngeal(NP) swabs in vial transport medium  Result Value Ref Range Status   SARS Coronavirus 2 by RT PCR NEGATIVE NEGATIVE Final    Comment: (NOTE) SARS-CoV-2 target nucleic acids are NOT DETECTED.  The SARS-CoV-2 RNA is generally detectable in upper respiratory specimens during the acute phase of infection. The lowest concentration of SARS-CoV-2 viral copies this assay can detect is 138 copies/mL. A  negative result does not preclude SARS-Cov-2 infection and should not be used as the sole basis for treatment or other patient management decisions. A negative result may occur with  improper specimen collection/handling, submission of specimen other than nasopharyngeal swab, presence of viral mutation(s) within the areas targeted by this assay, and inadequate number of viral copies(<138 copies/mL). A negative result must be combined with clinical observations, patient history, and epidemiological information. The expected result is Negative.  Fact Sheet for Patients:  EntrepreneurPulse.com.au  Fact Sheet for Healthcare Providers:  IncredibleEmployment.be  This test is no t yet approved or cleared by the [place] FDA and  has been authorized for detection and/or diagnosis of SARS-CoV-2 by FDA under an Emergency Use Authorization (EUA). This EUA will remain  in effect (meaning this test can be used) for the duration of the COVID-19 declaration under Section 564(b)(1) of the Act, 21 U.S.C.section 360bbb-3(b)(1), unless the authorization is terminated  or revoked sooner.       Influenza A by PCR NEGATIVE NEGATIVE Final   Influenza B by PCR NEGATIVE NEGATIVE Final    Comment: (NOTE) The Xpert Xpress SARS-CoV-2/FLU/RSV plus assay is intended as an aid in the diagnosis of influenza from Nasopharyngeal swab specimens and should not be used as a sole basis for treatment. Nasal washings and aspirates are unacceptable for Xpert Xpress SARS-CoV-2/FLU/RSV testing.  Fact Sheet for Patients: EntrepreneurPulse.com.au  Fact Sheet for Healthcare Providers: IncredibleEmployment.be  This test is not yet approved or cleared by the [place] FDA and has been authorized for detection and/or diagnosis of SARS-CoV-2 by FDA under an Emergency Use Authorization (EUA). This EUA will remain in effect (meaning this test can  be used) for the duration of the COVID-19 declaration under Section 564(b)(1) of the Act, 21 U.S.C.  section 360bbb-3(b)(1), unless the authorization is terminated or revoked.  Performed at [facility], [address] [place], [place] [address]     Procedures and diagnostic studies:  CT HEAD WO CONTRAST  Result Date: 04/14/2021 CLINICAL DATA:  Mental status change, unknown cause. Additional history provided: Mental status change, past history of cerebral aneurysm repair, seizure and stroke. EXAM: CT HEAD WITHOUT CONTRAST TECHNIQUE: Contiguous axial images were obtained from the base of the skull through the vertex without intravenous contrast. COMPARISON:  Brain MRI 01/17/2021.  Head CT 01/17/2021. FINDINGS: Brain: Mild generalized cerebral and cerebellar atrophy. Redemonstrated chronic cortical/subcortical infarct within the right frontal operculum and right insula. Associated wallerian degeneration affecting the right brainstem. Redemonstrated chronic lacunar infarcts within the deep gray nuclei bilaterally. Background advanced patchy and ill-defined hypoattenuation within the cerebral white matter, nonspecific but compatible with chronic small vessel ischemic disease. Redemonstrated small chronic infarcts within the bilateral cerebellar hemispheres. There is no acute intracranial hemorrhage. No acute demarcated cortical infarct. No extra-axial fluid collection. No evidence of intracranial mass. No midline shift. Vascular: No hyperdense vessel.  Atherosclerotic calcifications. Skull: Prior right craniotomy.  No calvarial fracture. Sinuses/Orbits: Visualized orbits show no acute finding. No significant paranasal sinus disease at the imaged levels. IMPRESSION: No evidence of acute intracranial abnormality. Redemonstrated chronic cortical/subcortical infarct within the right frontal operculum and right insula with overlying craniotomy defect. Associated Wallerian degeneration of the right  brainstem. Redemonstrated chronic lacunar infarcts within the deep gray nuclei bilaterally. Stable background severe cerebral white matter chronic small vessel ischemic disease. Redemonstrated chronic infarcts within the bilateral cerebellar hemispheres. Stable mild generalized parenchymal atrophy. Electronically Signed   By: Kellie Simmering DO   On: 04/14/2021 13:37               LOS: 6 days   Caellum Mancil  [facility]   Pager on [email or web address]. If 7PM-7AM, please contact night-coverage at [email or web address]     04/15/2021, 12:43 PM

## 2021-04-15 NOTE — Progress Notes (Signed)
Secure chat to Dr. Lovie Macadamia that patient refused am meds with exception of Clonidine patch. Patient placed her blanket in her mouth when nurse attempting to give medications.

## 2021-04-15 NOTE — Progress Notes (Signed)
Physical Therapy Treatment Patient Details Name: Kirsten Mcmahon MRN: [id number] DOB: [birth year] Today's Date: 04/15/2021    History of Present Illness Pt is a 75 year old female with PMH including CKD, HTN, Type 2 diabetes, ESRD, and prior CVA with L side hemiplegia.  Pt presented to ER secondary to fluid overload; admitted for management of acute/chronic CKD (stage IV).  Hopsital course significant for R perm-cath placement (5/6).    PT Comments    Pt was long sitting in bed upon arriving. Agrees to session with encouragement. Pt was very flat throughout session and somewhat lethargic. Needs constant encouragement and cues for encouragement. Mod-max to transition to R side of bed. Sat EOB for awhile prior  To standing with +2 assist. Pt will need continued skilled PT at DC to address deficit while maximizing independence with ADLs and decreasing caregiver burden.    Follow Up Recommendations  SNF;Home health PT (SNF versus Home with PACE program. Needs extensive PT going forward.)     Equipment Recommendations  Other (comment) (defer to next level of care)       Precautions / Restrictions Precautions Precautions: Fall Precaution Comments: R perm-cath; history of R fronto-temporal craniectomy with musculocutaneous flap over site Restrictions Weight Bearing Restrictions: No    Mobility  Bed Mobility Overal bed mobility: Needs Assistance Bed Mobility: Supine to Sit     Supine to sit: Mod assist;Max assist;HOB elevated Sit to supine:  (pt left seated EOB with OT at conclusion of PT session)   General bed mobility comments: Pt was able to exit R side of bed with mod-max assist of one. Pt struggles with LLE/LUE. Sat EOB with supervision    Transfers Overall transfer level: Needs assistance Equipment used: Rolling walker (2 wheeled) Transfers: Sit to/from Stand Sit to Stand: +2 safety/equipment;From elevated surface;Mod assist         General transfer comment: Pt was able  to stand EOB with +2 assist. Needs alot of vcs and constant assistance to maintain standing. poor standing posture/balance.  Ambulation/Gait             General Gait Details: unsafe/unable; non-ambulatory at baseline       Balance Overall balance assessment: Needs assistance Sitting-balance support: No upper extremity supported;Feet supported Sitting balance-[name] Scale: Fair Sitting balance - Comments: no LOB seated EOB x ~ 10 minutes throughout session. does need several cues to stay focused and alert   Standing balance support: During functional activity;Bilateral upper extremity supported Standing balance-[name] Scale: Poor      Cognition Arousal/Alertness: Awake/alert;Lethargic Behavior During Therapy: Flat affect Overall Cognitive Status: No family/caregiver present to determine baseline cognitive functioning    General Comments: Pt was able to follow simple one step commands inconsistently with increased tie to process. needs tactle cues and repetition         General Comments General comments (skin integrity, edema, etc.): pt performed several ther ex in bed and seated EOB to promote strengthening      Pertinent Vitals/Pain Pain Assessment: No/denies pain Faces Pain Scale: No hurt           PT Goals (current goals can now be found in the care plan section) Acute Rehab PT Goals Patient Stated Goal: none stated Progress towards PT goals: Progressing toward goals    Frequency    Min 2X/week      PT Plan Discharge plan needs to be updated       AM-PAC PT "6 Clicks" Mobility   Outcome Measure  Help needed turning from your back to your side while in a flat bed without using bedrails?: A Lot Help needed moving from lying on your back to sitting on the side of a flat bed without using bedrails?: A Lot Help needed moving to and from a bed to a chair (including a wheelchair)?: A Lot Help needed standing up from a chair using your arms (e.g., wheelchair  or bedside chair)?: A Lot Help needed to walk in hospital room?: Total Help needed climbing 3-5 steps with a railing? : Total 6 Click Score: 10    End of Session Equipment Utilized During Treatment: Gait belt Activity Tolerance: Patient tolerated treatment well Patient left:  (pt was seated EOB with OT at conclusion of PT session) Nurse Communication: Mobility status PT Visit Diagnosis: Muscle weakness (generalized) (M62.81);Difficulty in walking, not elsewhere classified (R26.2);Hemiplegia and hemiparesis Hemiplegia - Right/Left: Left Hemiplegia - dominant/non-dominant: Non-dominant Hemiplegia - caused by: Cerebral infarction     Time: 1350-1408 PT Time Calculation (min) (ACUTE ONLY): 18 min  Charges:  $Therapeutic Activity: 8-22 mins                     Julaine Fusi PTA 04/15/21, 4:15 PM

## 2021-04-15 NOTE — Progress Notes (Signed)
Occupational Therapy Treatment Patient Details Name: Kirsten Mcmahon MRN: [id number] DOB: 11-Mar-1946 Today's Date: 04/15/2021    History of present illness Pt is a 75 year old female with PMH including CKD, HTN, Type 2 diabetes, ESRD, and prior CVA with L side hemiplegia.  Pt presented to ER secondary to fluid overload; admitted for management of acute/chronic CKD (stage IV).  Hopsital course significant for R perm-cath placement (5/6).   OT comments  Pt seen for OT treatment this date to f/u re: safety with ADLs/ADL mobility. Pt seated EOB with PTA finishing up session when OT presents. Pt requires MOD A +2 with verbal/tactile cues to sequence/for safety and arm in arm assist bilaterally to attempt 1 STS. Pt is noted to have L lean. Pt tolerates EOB sitting ~10 mins while OT engages pt in seated UB grooming/hygiene tasks including oral care for which pt requires MIN A to initiate/terminate tasks and tactile/verbal cues to sequence task intermittently throughout. Pt requires MIN/MOD A for sit to sup transition. Left in bed with all needs meta nd in reach. Bed alarm set. As pt is requiring 2p assist to come to stand this date, d/c recommendation updated to SNF to reflect current level of care pt is requiring.    Follow Up Recommendations  SNF;Supervision/Assistance - 24 hour    Equipment Recommendations  3 in 1 bedside commode;Tub/shower seat;Other (comment) (2ww)    Recommendations for Other Services      Precautions / Restrictions Precautions Precautions: Fall Precaution Comments: R perm-cath; history of R fronto-temporal craniectomy with musculocutaneous flap over site Restrictions Weight Bearing Restrictions: No       Mobility Bed Mobility Overal bed mobility: Needs Assistance Bed Mobility: Sit to Supine     Supine to sit: Mod assist;Max assist;HOB elevated Sit to supine: Min assist;Mod assist   General bed mobility comments: increased time and assist to L side to assist pt  in transition from sit to supine. Tactile/verbal cues to assist pt with attn to task and contributing to mobility.    Transfers Overall transfer level: Needs assistance Equipment used: Rolling walker (2 wheeled) Transfers: Sit to/from Stand Sit to Stand: +2 safety/equipment;From elevated surface;Mod assist         General transfer comment: cues for safety, increased time, noted to L lean    Balance Overall balance assessment: Needs assistance Sitting-balance support: No upper extremity supported;Feet supported Sitting balance-[name] Mcmahon: Fair Sitting balance - Comments: no LOB, G static sitting, F dynamic (cannot accept challenge d/t decreased stability on L side)   Standing balance support: During functional activity;Bilateral upper extremity supported Standing balance-[name] Mcmahon: Poor Standing balance comment: b/l support required                           ADL either performed or assessed with clinical judgement   ADL Overall ADL's : Needs assistance/impaired     Grooming: Wash/dry hands;Wash/dry face;Oral care;Sitting;Set up;Minimal assistance;Cueing for sequencing Grooming Details (indicate cue type and reason): tactile/verbal cues to attend.                                     Vision   Additional Comments: difficult to formally assedd, R side gaze observed   Perception     Praxis      Cognition Arousal/Alertness: Awake/alert;Lethargic Behavior During Therapy: Flat affect Overall Cognitive Status: No family/caregiver present to determine baseline  cognitive functioning                                 General Comments: increased processing time, follows simple one step commands inconsistently. Tactile/verbal cues to attedn and sequence.        Exercises Exercises: General Lower Extremity Other Exercises Other Exercises: OT engages pt in seated grooming tasks with MIN A, cues to intiiate/terminate and cues to sequence  throughout. Pt requires MIN/MOD A for sit to sup.   Shoulder Instructions       General Comments pt performed several ther ex in bed and seated EOB to promote strengthening    Pertinent Vitals/ Pain       Pain Assessment: Faces Faces Pain Mcmahon: No hurt  Home Living                                          Prior Functioning/Environment              Frequency  Min 1X/week        Progress Toward Goals  OT Goals(current goals can now be found in the care plan section)  Progress towards OT goals: Progressing toward goals  Acute Rehab OT Goals Patient Stated Goal: none stated OT Goal Formulation: Patient unable to participate in goal setting Time For Goal Achievement: 04/27/21 Potential to Achieve Goals: [place] Discharge plan remains appropriate    Co-evaluation                 AM-PAC OT "6 Clicks" Daily Activity     Outcome Measure   Help from another person eating meals?: A Little Help from another person taking care of personal grooming?: A Little Help from another person toileting, which includes using toliet, bedpan, or urinal?: A Lot Help from another person bathing (including washing, rinsing, drying)?: A Lot Help from another person to put on and taking off regular upper body clothing?: A Little Help from another person to put on and taking off regular lower body clothing?: A Lot 6 Click Score: 15    End of Session    OT Visit Diagnosis: Other abnormalities of gait and mobility (R26.89);Other symptoms and signs involving cognitive function   Activity Tolerance Treatment limited secondary to agitation   Patient Left in bed;with call bell/phone within reach;with nursing/sitter in room;with bed alarm set   Nurse Communication          Time: 5993-5701 OT Time Calculation (min): 14 min  Charges: OT General Charges $OT Visit: 1 Visit OT Treatments $Self Care/Home Management : 8-22 mins  Kirsten Mcmahon, Kirsten Mcmahon, OTR/L ascom  [phone] 04/15/21, 5:15 PM

## 2021-04-16 DIAGNOSIS — T7840XA Allergy, unspecified, initial encounter: Secondary | ICD-10-CM | POA: Insufficient documentation

## 2021-04-16 DIAGNOSIS — E1151 Type 2 diabetes mellitus with diabetic peripheral angiopathy without gangrene: Secondary | ICD-10-CM | POA: Insufficient documentation

## 2021-04-16 DIAGNOSIS — N1832 Chronic kidney disease, stage 3b: Secondary | ICD-10-CM | POA: Diagnosis not present

## 2021-04-16 DIAGNOSIS — N186 End stage renal disease: Secondary | ICD-10-CM | POA: Diagnosis not present

## 2021-04-16 DIAGNOSIS — T782XXA Anaphylactic shock, unspecified, initial encounter: Secondary | ICD-10-CM | POA: Insufficient documentation

## 2021-04-16 DIAGNOSIS — L299 Pruritus, unspecified: Secondary | ICD-10-CM | POA: Insufficient documentation

## 2021-04-16 DIAGNOSIS — I10 Essential (primary) hypertension: Secondary | ICD-10-CM | POA: Diagnosis not present

## 2021-04-16 DIAGNOSIS — D509 Iron deficiency anemia, unspecified: Secondary | ICD-10-CM | POA: Insufficient documentation

## 2021-04-16 DIAGNOSIS — T829XXA Unspecified complication of cardiac and vascular prosthetic device, implant and graft, initial encounter: Secondary | ICD-10-CM | POA: Insufficient documentation

## 2021-04-16 DIAGNOSIS — N171 Acute kidney failure with acute cortical necrosis: Secondary | ICD-10-CM | POA: Diagnosis not present

## 2021-04-16 DIAGNOSIS — N2581 Secondary hyperparathyroidism of renal origin: Secondary | ICD-10-CM | POA: Insufficient documentation

## 2021-04-16 LAB — RENAL FUNCTION PANEL
Albumin: 3.2 g/dL — ABNORMAL LOW (ref 3.5–5.0)
Anion gap: 14 (ref 5–15)
BUN: <5 mg/dL — ABNORMAL LOW (ref 8–23)
CO2: 27 mmol/L (ref 22–32)
Calcium: 8.6 mg/dL — ABNORMAL LOW (ref 8.9–10.3)
Chloride: 96 mmol/L — ABNORMAL LOW (ref 98–111)
Creatinine, Ser: 1.17 mg/dL — ABNORMAL HIGH (ref 0.44–1.00)
GFR, Estimated: 48 mL/min — ABNORMAL LOW (ref >60)
Glucose, Bld: 119 mg/dL — ABNORMAL HIGH (ref 70–99)
Phosphorus: 1.4 mg/dL — ABNORMAL LOW (ref 2.5–4.6)
Potassium: 3.1 mmol/L — ABNORMAL LOW (ref 3.5–5.1)
Sodium: 137 mmol/L (ref 135–145)

## 2021-04-16 LAB — CBC
HCT: 29.4 % — ABNORMAL LOW (ref 36.0–46.0)
Hemoglobin: 9.6 g/dL — ABNORMAL LOW (ref 12.0–15.0)
MCH: 28.8 pg (ref 26.0–34.0)
MCHC: 32.7 g/dL (ref 30.0–36.0)
MCV: 88.3 fL (ref 80.0–100.0)
Platelets: 257 10*3/uL (ref 150–400)
RBC: 3.33 MIL/uL — ABNORMAL LOW (ref 3.87–5.11)
RDW: 16.4 % — ABNORMAL HIGH (ref 11.5–15.5)
WBC: 18.2 10*3/uL — ABNORMAL HIGH (ref 4.0–10.5)
nRBC: 0 % (ref 0.0–0.2)

## 2021-04-16 LAB — MAGNESIUM: Magnesium: 1.8 mg/dL (ref 1.7–2.4)

## 2021-04-16 NOTE — Progress Notes (Signed)
In to room because teenager came to desk asking a question.  Daughter, 2 teenagers and 1 small child.  Patient nor children had green tags and stated that they bypassed the visitors desk because she wanted them to see their grandmother.  Patient was cursing and combative with children.  Advised 3 times to leave - Restaurant manager, fast food called.  Manager in to address patient visitation they left.  However, they did not leave via the elevators they walked to 2A - went down some stairs and exited close to the volunteer desk.  Volunteers saw them and they stated that they never came by them when they entered but are now aware

## 2021-04-16 NOTE — TOC Progression Note (Signed)
Transition of Care [facility]) - Progression Note    Patient Details  Name: AVALIE OCONNOR MRN: [id number] Date of Birth: 05/08/1946  [facility] CM/SW [place], LCSW Phone Number: 04/16/2021, 8:46 AM  Clinical Narrative:  Updated PACE nurse, Donata Clay. PT and OT recommending SNF placement. According to [facility] note on 5/7, "He (son) reported he does not want patient to go to a SNF, that he feels he can adequately care for patient at home."  Expected Discharge Plan: [place] Barriers to Discharge: Continued Medical Work up  Expected Discharge Plan and Services Expected Discharge Plan: [place] arrangements for the past 2 months: Single Family Home                                       Social Determinants of Health (SDOH) Interventions    Readmission Risk Interventions Readmission Risk Prevention Plan 04/11/2021  Transportation Screening Complete  PCP or Specialist Appt within 3-5 Days Complete  HRI or [place] Complete  Social Work Consult for [place] Planning/Counseling Complete  Palliative Care Screening Not Applicable  Medication Review Press photographer) Complete  Some recent data might be hidden

## 2021-04-16 NOTE — Progress Notes (Signed)
1 ml of heparin given per RN to prevent clotting in bloodlines MD aware.

## 2021-04-16 NOTE — Plan of Care (Signed)
Pt is alert and disoriented x 3. Pt uncooperative and combative with cares. Afebrile. Still refused to take oral meds.

## 2021-04-16 NOTE — Plan of Care (Signed)

## 2021-04-16 NOTE — Progress Notes (Signed)
Spoke with Dewayne in central telemetry at [phone].

## 2021-04-16 NOTE — Progress Notes (Signed)
Pre HD RN assessment 

## 2021-04-16 NOTE — Progress Notes (Signed)
   04/16/21 0812  Assess: MEWS Score  Temp 99.1 F (37.3 C)  BP (!) 160/78  Pulse Rate (!) 111  Resp 20  SpO2 99 %  O2 Device Room Air  Assess: MEWS Score  MEWS Temp 0  MEWS Systolic 0  MEWS Pulse 2  MEWS RR 0  MEWS LOC 0  MEWS Score 2  MEWS Score Color Yellow  Assess: if the MEWS score is Yellow or Red  Were vital signs taken at a resting state? Yes  Focused Assessment No change from prior assessment  Early Detection of Sepsis Score *See Row Information* Medium  MEWS guidelines implemented *See Row Information* No, other (Comment)  Treat  MEWS Interventions  (Patient in dialysis gets frequent vital signs there)  Pain Scale 0-10  Pain Score Asleep  Take Vital Signs  Increase Vital Sign Frequency  Yellow: Q 2hr X 2 then Q 4hr X 2, if remains yellow, continue Q 4hrs  Escalate  MEWS: Escalate Yellow: discuss with charge nurse/RN and consider discussing with provider and RRT  Notify: Charge Nurse/RN  Name of Charge Nurse/RN Notified Olga Coaster.  Date Charge Nurse/RN Notified 04/16/21  Time Charge Nurse/RN Notified 2229  Notify: Provider  Provider Name/Title Nehphrology NP  Date Provider Notified 04/16/21  Time Provider Notified [phone]  Notification Type Face-to-face  Provider response In department  Document  Patient Outcome Other (Comment) (To Dialysis)  Assess: SIRS CRITERIA  SIRS Temperature  0  SIRS Pulse 1  SIRS Respirations  0  SIRS WBC 0  SIRS Score Sum  1

## 2021-04-16 NOTE — Care Management Important Message (Signed)
Important Message  Patient Details  Name: Kirsten Mcmahon MRN: [id number] Date of Birth: [birth year]   Medicare Important Message Given:  Yes     [name] 04/16/2021, 12:37 PM

## 2021-04-16 NOTE — Progress Notes (Addendum)
Progress Note    Kirsten Mcmahon  JOA:[id number] DOB: 08/02/1946  DOA: 04/09/2021 PCP: [place]      Brief Narrative:    Medical records reviewed and are as summarized below:  Kirsten Mcmahon is a 75 y.o. female with medical history significant for CKD stage V, hypertension, hypothyroidism, diabetes mellitus, hyperlipidemia, history of stroke, seizure disorder, overactive bladder.  She presented to the hospital because of increased swelling of the lower extremities despite taking diuretics.  In the emergency room, she was noted to have a creatinine of 7.99 which was much higher than her previous creatinine of 3.99 (2 months prior).  She was admitted to the hospital for AKI on CKD stage V.  She was evaluated by the nephrologist.  Vascular surgeon was consulted and a right IJ tunneled hemodialysis permacath was placed on 04/10/2021.  Hemodialysis was initiated on 04/10/2021.      Assessment/Plan:   Principal Problem:   Acute renal failure superimposed on stage 3b chronic kidney disease (HCC) Active Problems:   Hypertension   Hypothyroidism   GERD (gastroesophageal reflux disease)   Anemia in chronic kidney disease (CKD)   Type II diabetes mellitus with renal manifestations ([place])   ESRD needing dialysis ([place])   Body mass index is 24.98 kg/m.     ESRD: Hemodialysis was initiated on 04/10/2021.  She will continue hemodialysis as an outpatient.  She now has an outpatient hemodialysis chair at [facility] on Mondays, Wednesdays and Fridays.  Her son said he will be able to transport her to hemodialysis.  Confusion, suspected metabolic encephalopathy/delirium, underlying cognitive impairment: According to her son, patient has had chronic confusion since her fall in February 2022.  He said she has never been the same since her fall.  He said she used to be able to "work the phone" but since that fall, she has not been able to use a phone to make a call.      Leukocytosis: Increasing WBC.  Etiology unclear.  Follow-up CBC.  Hypokalemia and hypophosphatemia: Defer treatment to nephrologist.  Left lower extremity edema: No evidence of DVT.  Anemia of chronic disease: S/p transfusion with 1 unit of PRBCs on 04/11/2021.  Seizure disorder: Continue IV Keppra.  Hypertension: Continue clonidine patch and other antihypertensives as able  Type II DM: NovoLog as needed for hyperglycemia.  CAD, history of stroke with left-sided weakness: Change oral aspirin to aspirin suppository.  Possible discharge to home tomorrow.  Diet Order            Diet renal with fluid restriction Fluid restriction: 1200 mL Fluid; Room service appropriate? Yes; Fluid consistency: Thin  Diet effective now                    Consultants:  Vascular surgeon  Nephrologist  Procedures:  Right IJ permacath placed on 04/10/2021    Medications:   . amLODipine  10 mg Oral Daily  . aspirin  300 mg Rectal Daily  . Chlorhexidine Gluconate Cloth  6 each Topical Q0600  . cholecalciferol  1,000 Units Oral Daily  . cloNIDine  0.3 mg Transdermal Weekly  . epoetin (EPOGEN/PROCRIT) injection  4,000 Units Intravenous Q T,Th,Sa-HD  . heparin  5,000 Units Subcutaneous Q8H  . latanoprost  1 drop Both Eyes QHS  . letrozole  2.5 mg Oral Daily  . levothyroxine  112 mcg Oral Q0600  . metoprolol succinate  50 mg Oral Daily   Continuous Infusions: .  levETIRAcetam Stopped (04/15/21 2051)     Anti-infectives (From admission, onward)   Start     Dose/Rate Route Frequency Ordered Stop   04/11/21 0600  ceFAZolin (ANCEF) 1 g in sodium chloride 0.9 % 100 mL IVPB       Note to Pharmacy: To be given in specials   1 g 200 mL/hr over 30 Minutes Intravenous On call to O.R. 04/10/21 1432 04/10/21 1546             Family Communication/Anticipated D/C date and plan/Code Status   DVT prophylaxis: heparin injection 5,000 Units Start: 04/09/21 2200     Code Status: Full  Code  Family Communication: [name], son Disposition Plan:    Status is: Inpatient  Remains inpatient appropriate because:IV treatments appropriate due to intensity of illness or inability to take PO and Inpatient level of care appropriate due to severity of illness   Dispo: The patient is from: Home              Anticipated d/c is to: Home              Patient currently is not medically stable to d/c.   Difficult to place patient No           Subjective:   Interval events noted.  She was seen at the hemodialysis unit.  She is confused and unable to provide any history.  Objective:    Vitals:   04/16/21 1200 04/16/21 1215 04/16/21 1230 04/16/21 1247  BP: (!) 133/96 120/87 (!) 119/93 (!) 174/89  Pulse: (!) 114 (!) 111 (!) 112 (!) 107  Resp: (!) 24 (!) 21 (!) 23 18  Temp:    99.2 F (37.3 C)  TempSrc:    Oral  SpO2: 98%   99%  Weight:      Height:       No data found.   Intake/Output Summary (Last 24 hours) at 04/16/2021 1327 Last data filed at 04/16/2021 1230 Gross per 24 hour  Intake 557.57 ml  Output 2001 ml  Net -1443.43 ml   Filed Weights   04/14/21 0500 04/15/21 0500 04/16/21 0625  Weight: 61 kg 59.9 kg 62 kg    Exam:  GEN: NAD SKIN: No rash EYES: EOMI ENT: MMM CV: RRR PULM: CTA B ABD: soft, obese, NT, +BS CNS: Alert but disoriented, left-sided weakness EXT: No edema or tenderness          Data Reviewed:   I have personally reviewed following labs and imaging studies:  Labs: Labs show the following:   Basic Metabolic Panel: Recent Labs  Lab 04/09/21 1545 04/10/21 0753 04/13/21 1150 04/15/21 1007  NA 137 136 138 137  K 4.9 3.9 2.9* 3.4*  CL 108 100 99 100  CO2 17* 25 27 27   GLUCOSE 111* 67* 101* 125*  BUN 79* 71* 13 11  CREATININE 7.99* 6.89* 2.02* 3.00*  CALCIUM 7.3* 6.2* 8.3* 7.9*  MG  --   --   --  1.8  PHOS  --   --  2.2*  --    GFR Estimated Creatinine Clearance: 13.8 mL/min (A) (by C-G formula based on SCr  of 3 mg/dL (H)). Liver Function Tests: Recent Labs  Lab 04/10/21 0753 04/13/21 1150  AST 11*  --   ALT 9  --   ALKPHOS 53  --   BILITOT 0.5  --   PROT 5.6*  --   ALBUMIN 2.5* 3.2*   No results for input(s): LIPASE, AMYLASE  in the last 168 hours. No results for input(s): AMMONIA in the last 168 hours. Coagulation profile No results for input(s): INR, PROTIME in the last 168 hours.  CBC: Recent Labs  Lab 04/09/21 1545 04/10/21 0753 04/11/21 0758 04/13/21 1150 04/15/21 1007 04/16/21 1212  WBC 10.6* 7.4  --  15.1* 11.2* 18.2*  NEUTROABS  --   --   --   --  8.5*  --   HGB 8.1* 6.1* 6.8* 9.1* 9.1* 9.6*  HCT 24.8* 18.6* 20.4* 27.1* 27.0* 29.4*  MCV 92.2 90.7  --  85.8 88.2 88.3  PLT 246 179  --  202 206 257   Cardiac Enzymes: No results for input(s): CKTOTAL, CKMB, CKMBINDEX, TROPONINI in the last 168 hours. BNP (last 3 results) No results for input(s): PROBNP in the last 8760 hours. CBG: Recent Labs  Lab 04/13/21 1615 04/13/21 2058 04/14/21 0724 04/14/21 1124 04/14/21 2144  GLUCAP 93 127* 82 126* 95   D-Dimer: No results for input(s): DDIMER in the last 72 hours. Hgb A1c: No results for input(s): HGBA1C in the last 72 hours. Lipid Profile: No results for input(s): CHOL, HDL, LDLCALC, TRIG, CHOLHDL, LDLDIRECT in the last 72 hours. Thyroid function studies: No results for input(s): TSH, T4TOTAL, T3FREE, THYROIDAB in the last 72 hours.  Invalid input(s): FREET3 Anemia work up: No results for input(s): VITAMINB12, FOLATE, FERRITIN, TIBC, IRON, RETICCTPCT in the last 72 hours. Sepsis Labs: Recent Labs  Lab 04/10/21 0753 04/13/21 1150 04/15/21 1007 04/16/21 1212  WBC 7.4 15.1* 11.2* 18.2*    Microbiology Recent Results (from the past 240 hour(s))  Resp Panel by RT-PCR (Flu A&B, Covid) Nasopharyngeal Swab     Status: None   Collection Time: 04/09/21  5:58 PM   Specimen: Nasopharyngeal Swab; Nasopharyngeal(NP) swabs in vial transport medium  Result Value  Ref Range Status   SARS Coronavirus 2 by RT PCR NEGATIVE NEGATIVE Final    Comment: (NOTE) SARS-CoV-2 target nucleic acids are NOT DETECTED.  The SARS-CoV-2 RNA is generally detectable in upper respiratory specimens during the acute phase of infection. The lowest concentration of SARS-CoV-2 viral copies this assay can detect is 138 copies/mL. A negative result does not preclude SARS-Cov-2 infection and should not be used as the sole basis for treatment or other patient management decisions. A negative result may occur with  improper specimen collection/handling, submission of specimen other than nasopharyngeal swab, presence of viral mutation(s) within the areas targeted by this assay, and inadequate number of viral copies(<138 copies/mL). A negative result must be combined with clinical observations, patient history, and epidemiological information. The expected result is Negative.  Fact Sheet for Patients:  EntrepreneurPulse.com.au  Fact Sheet for Healthcare Providers:  IncredibleEmployment.be  This test is no t yet approved or cleared by the [place] FDA and  has been authorized for detection and/or diagnosis of SARS-CoV-2 by FDA under an Emergency Use Authorization (EUA). This EUA will remain  in effect (meaning this test can be used) for the duration of the COVID-19 declaration under Section 564(b)(1) of the Act, 21 U.S.C.section 360bbb-3(b)(1), unless the authorization is terminated  or revoked sooner.       Influenza A by PCR NEGATIVE NEGATIVE Final   Influenza B by PCR NEGATIVE NEGATIVE Final    Comment: (NOTE) The Xpert Xpress SARS-CoV-2/FLU/RSV plus assay is intended as an aid in the diagnosis of influenza from Nasopharyngeal swab specimens and should not be used as a sole basis for treatment. Nasal washings and aspirates are unacceptable for  Xpert Xpress SARS-CoV-2/FLU/RSV testing.  Fact Sheet for  Patients: EntrepreneurPulse.com.au  Fact Sheet for Healthcare Providers: IncredibleEmployment.be  This test is not yet approved or cleared by the [place] FDA and has been authorized for detection and/or diagnosis of SARS-CoV-2 by FDA under an Emergency Use Authorization (EUA). This EUA will remain in effect (meaning this test can be used) for the duration of the COVID-19 declaration under Section 564(b)(1) of the Act, 21 U.S.C. section 360bbb-3(b)(1), unless the authorization is terminated or revoked.  Performed at [facility], [address] [place], [place] [address]     Procedures and diagnostic studies:  No results found.             LOS: 7 days   Paydon Carll  [facility]   Pager on [email or web address]. If 7PM-7AM, please contact night-coverage at [email or web address]     04/16/2021, 1:27 PM

## 2021-04-16 NOTE — Progress Notes (Signed)
OT Cancellation Note  Patient Details Name: PATRIC VANPELT MRN: [id number] DOB: 04/01/1946   Cancelled Treatment:    Reason Eval/Treat Not Completed: Patient at procedure or test/ unavailable  Pt OTF to HD at this time. Will f/u for OT treatment at later date/time as able/pt available. Thank you.  Gerrianne Scale, [place], OTR/L ascom [phone] 04/16/21, 3:21 PM

## 2021-04-16 NOTE — Progress Notes (Signed)
[name]  ROUNDING NOTE   Subjective:   Patient seen resting in bed Not as talkative/interactive as previous days Incoherently mumbles yes and no to simples questions Breakfast at bedside, refused set up  Patient seen later during dialysis   HEMODIALYSIS FLOWSHEET:  Blood Flow Rate (mL/min): 400 mL/min Arterial Pressure (mmHg): -190 mmHg Venous Pressure (mmHg): 120 mmHg Transmembrane Pressure (mmHg): 60 mmHg Ultrafiltration Rate (mL/min): 830 mL/min Dialysate Flow Rate (mL/min): 600 ml/min Conductivity: Machine : 14 Conductivity: Machine : 14 Dialysis Fluid Bolus: Normal Saline Bolus Amount (mL): 250 mL Dialysate Change: 2K  Patient sitting in chair  Objective:  Vital signs in last 24 hours:  Temp:  [98.2 F (36.8 C)-99.1 F (37.3 C)] 98.4 F (36.9 C) (05/12 0927) Pulse Rate:  [56-114] 111 (05/12 1045) Resp:  [16-23] 20 (05/12 1045) BP: (140-178)/(75-109) 159/93 (05/12 1045) SpO2:  [98 %-100 %] 99 % (05/12 0812) Weight:  [62 kg] 62 kg (05/12 0625)  Weight change: 2.061 kg Filed Weights   04/14/21 0500 04/15/21 0500 04/16/21 0625  Weight: 61 kg 59.9 kg 62 kg    Intake/Output: I/O last 3 completed shifts: In: 917.6 [P.O.:560; IV Piggyback:357.6] Out: 701 [Urine:700; Stool:1]   Intake/Output this shift:  No intake/output data recorded.  Physical Exam: General: NAD, resting in bed  Head: Normocephalic, atraumatic. Moist oral mucosal membranes  Eyes: Anicteric  Lungs:  Diminished in bases  Heart: Regular rhythm and rate  Abdomen:  Soft, nontender,  distended  Extremities:  trace peripheral edema.  Neurologic: Alert and oriented to self  Skin: No lesions  Access: RIJ permcath 5/6 Dr. Lucky Cowboy    Basic Metabolic Panel: Recent Labs  Lab 04/09/21 1545 04/10/21 0753 04/13/21 1150 04/15/21 1007  NA 137 136 138 137  K 4.9 3.9 2.9* 3.4*  CL 108 100 99 100  CO2 17* 25 27 27   GLUCOSE 111* 67* 101* 125*  BUN 79* 71* 13 11  CREATININE 7.99*  6.89* 2.02* 3.00*  CALCIUM 7.3* 6.2* 8.3* 7.9*  MG  --   --   --  1.8  PHOS  --   --  2.2*  --     Liver Function Tests: Recent Labs  Lab 04/10/21 0753 04/13/21 1150  AST 11*  --   ALT 9  --   ALKPHOS 53  --   BILITOT 0.5  --   PROT 5.6*  --   ALBUMIN 2.5* 3.2*   No results for input(s): LIPASE, AMYLASE in the last 168 hours. No results for input(s): AMMONIA in the last 168 hours.  CBC: Recent Labs  Lab 04/09/21 1545 04/10/21 0753 04/11/21 0758 04/13/21 1150 04/15/21 1007  WBC 10.6* 7.4  --  15.1* 11.2*  NEUTROABS  --   --   --   --  8.5*  HGB 8.1* 6.1* 6.8* 9.1* 9.1*  HCT 24.8* 18.6* 20.4* 27.1* 27.0*  MCV 92.2 90.7  --  85.8 88.2  PLT 246 179  --  202 206    Cardiac Enzymes: No results for input(s): CKTOTAL, CKMB, CKMBINDEX, TROPONINI in the last 168 hours.  BNP: Invalid input(s): POCBNP  CBG: Recent Labs  Lab 04/13/21 1615 04/13/21 2058 04/14/21 0724 04/14/21 1124 04/14/21 2144  GLUCAP 93 127* 82 126* 95    Microbiology: Results for orders placed or performed during the hospital encounter of 04/09/21  Resp Panel by RT-PCR (Flu A&B, Covid) Nasopharyngeal Swab     Status: None   Collection Time: 04/09/21  5:58 PM  Specimen: Nasopharyngeal Swab; Nasopharyngeal(NP) swabs in vial transport medium  Result Value Ref Range Status   SARS Coronavirus 2 by RT PCR NEGATIVE NEGATIVE Final    Comment: (NOTE) SARS-CoV-2 target nucleic acids are NOT DETECTED.  The SARS-CoV-2 RNA is generally detectable in upper respiratory specimens during the acute phase of infection. The lowest concentration of SARS-CoV-2 viral copies this assay can detect is 138 copies/mL. A negative result does not preclude SARS-Cov-2 infection and should not be used as the sole basis for treatment or other patient management decisions. A negative result may occur with  improper specimen collection/handling, submission of specimen other than nasopharyngeal swab, presence of viral  mutation(s) within the areas targeted by this assay, and inadequate number of viral copies(<138 copies/mL). A negative result must be combined with clinical observations, patient history, and epidemiological information. The expected result is Negative.  Fact Sheet for Patients:  EntrepreneurPulse.com.au  Fact Sheet for Healthcare Providers:  IncredibleEmployment.be  This test is no t yet approved or cleared by the [place] FDA and  has been authorized for detection and/or diagnosis of SARS-CoV-2 by FDA under an Emergency Use Authorization (EUA). This EUA will remain  in effect (meaning this test can be used) for the duration of the COVID-19 declaration under Section 564(b)(1) of the Act, 21 U.S.C.section 360bbb-3(b)(1), unless the authorization is terminated  or revoked sooner.       Influenza A by PCR NEGATIVE NEGATIVE Final   Influenza B by PCR NEGATIVE NEGATIVE Final    Comment: (NOTE) The Xpert Xpress SARS-CoV-2/FLU/RSV plus assay is intended as an aid in the diagnosis of influenza from Nasopharyngeal swab specimens and should not be used as a sole basis for treatment. Nasal washings and aspirates are unacceptable for Xpert Xpress SARS-CoV-2/FLU/RSV testing.  Fact Sheet for Patients: EntrepreneurPulse.com.au  Fact Sheet for Healthcare Providers: IncredibleEmployment.be  This test is not yet approved or cleared by the [place] FDA and has been authorized for detection and/or diagnosis of SARS-CoV-2 by FDA under an Emergency Use Authorization (EUA). This EUA will remain in effect (meaning this test can be used) for the duration of the COVID-19 declaration under Section 564(b)(1) of the Act, 21 U.S.C. section 360bbb-3(b)(1), unless the authorization is terminated or revoked.  Performed at [facility], [address] [place], [place] [address]     Coagulation Studies: No  results for input(s): LABPROT, INR in the last 72 hours.  Urinalysis: No results for input(s): COLORURINE, LABSPEC, PHURINE, GLUCOSEU, HGBUR, BILIRUBINUR, KETONESUR, PROTEINUR, UROBILINOGEN, NITRITE, LEUKOCYTESUR in the last 72 hours.  Invalid input(s): APPERANCEUR    Imaging: CT HEAD WO CONTRAST  Result Date: 04/14/2021 CLINICAL DATA:  Mental status change, unknown cause. Additional history provided: Mental status change, past history of cerebral aneurysm repair, seizure and stroke. EXAM: CT HEAD WITHOUT CONTRAST TECHNIQUE: Contiguous axial images were obtained from the base of the skull through the vertex without intravenous contrast. COMPARISON:  Brain MRI 01/17/2021.  Head CT 01/17/2021. FINDINGS: Brain: Mild generalized cerebral and cerebellar atrophy. Redemonstrated chronic cortical/subcortical infarct within the right frontal operculum and right insula. Associated wallerian degeneration affecting the right brainstem. Redemonstrated chronic lacunar infarcts within the deep gray nuclei bilaterally. Background advanced patchy and ill-defined hypoattenuation within the cerebral white matter, nonspecific but compatible with chronic small vessel ischemic disease. Redemonstrated small chronic infarcts within the bilateral cerebellar hemispheres. There is no acute intracranial hemorrhage. No acute demarcated cortical infarct. No extra-axial fluid collection. No evidence of intracranial mass. No midline shift. Vascular: No  hyperdense vessel.  Atherosclerotic calcifications. Skull: Prior right craniotomy.  No calvarial fracture. Sinuses/Orbits: Visualized orbits show no acute finding. No significant paranasal sinus disease at the imaged levels. IMPRESSION: No evidence of acute intracranial abnormality. Redemonstrated chronic cortical/subcortical infarct within the right frontal operculum and right insula with overlying craniotomy defect. Associated Wallerian degeneration of the right brainstem.  Redemonstrated chronic lacunar infarcts within the deep gray nuclei bilaterally. Stable background severe cerebral white matter chronic small vessel ischemic disease. Redemonstrated chronic infarcts within the bilateral cerebellar hemispheres. Stable mild generalized parenchymal atrophy. Electronically Signed   By: Kellie Simmering DO   On: 04/14/2021 13:37     Medications:   . levETIRAcetam Stopped (04/15/21 2051)   . amLODipine  10 mg Oral Daily  . aspirin  300 mg Rectal Daily  . Chlorhexidine Gluconate Cloth  6 each Topical Q0600  . cholecalciferol  1,000 Units Oral Daily  . cloNIDine  0.3 mg Transdermal Weekly  . epoetin (EPOGEN/PROCRIT) injection  4,000 Units Intravenous Q T,Th,Sa-HD  . heparin  5,000 Units Subcutaneous Q8H  . latanoprost  1 drop Both Eyes QHS  . letrozole  2.5 mg Oral Daily  . levothyroxine  112 mcg Oral Q0600  . metoprolol succinate  50 mg Oral Daily   acetaminophen **OR** acetaminophen, calcium carbonate, camphor-menthol **AND** hydrOXYzine, docusate sodium, feeding supplement (NEPRO CARB STEADY), hydrALAZINE, LORazepam, ondansetron **OR** ondansetron (ZOFRAN) IV, sorbitol  Assessment/ Plan:  [name] is a 75 y.o.  female  with past medical history of GERD, Hypertension, diabetes, hyperlipidemia, CVA and CKD stage 4. She presents to the ED with increased lower extremity edema. She is known to our team from previous admissions.   1. End Stage Renal Disease requiring new start hemodialysis:  First dialysis treatment 5/6.   -Receiving dialysis today - UF goal 2.5L - Outpatient planning: Dialysis liaison notified of outpatient center acceptance at [facility], MWF, 1115. Son notified of this.  - Overall prognosis is poor related to mental status and health. Dialysis may not continue to be the best option and if health continues to decline, will re-evaluate current plan.   2. Anemia of chronic kidney disease  Lab Results  Component Value Date   HGB 9.1  (L) 04/15/2021   EPO will begin with treatments   3. Secondary Hyperparathyroidism: with hypocalcemia   Lab Results  Component Value Date   PTH 161 (H) 01/18/2021   CALCIUM 7.9 (L) 04/15/2021   PHOS 2.2 (L) 04/13/2021  Phosphorus below target Poor oral intake Sodium Phosphare given yesterday Will recheck phosphorus level  4.Diabetes mellitus type II with chronic kidney disease noninsulin dependent.  hemoglobin A1c is 5.7 on 01/18/21.  Stable  5. Hypertension: elevated with volume overload. Home regimen of clonidine, amlodipine, metoprolol.    LOS: 7   5/12/202210:51 AM

## 2021-04-16 NOTE — Progress Notes (Signed)
Patient excepted at [facility] MWF 11:15am. Son is aware and clinic information given. Son was concerned about patient's insurance covering this clinic, since they are in the middle of switching programs. [facility] admission stated that [facility] was called and approved for this clinic. Please contact me with any dialysis placement concerns.  Elvera Bicker Dialysis Coordinator [phone]

## 2021-04-17 ENCOUNTER — Inpatient Hospital Stay: Payer: Medicare (Managed Care)

## 2021-04-17 DIAGNOSIS — R509 Fever, unspecified: Secondary | ICD-10-CM | POA: Diagnosis not present

## 2021-04-17 DIAGNOSIS — D72825 Bandemia: Secondary | ICD-10-CM | POA: Diagnosis not present

## 2021-04-17 DIAGNOSIS — N186 End stage renal disease: Secondary | ICD-10-CM | POA: Diagnosis not present

## 2021-04-17 DIAGNOSIS — Z992 Dependence on renal dialysis: Secondary | ICD-10-CM | POA: Diagnosis not present

## 2021-04-17 DIAGNOSIS — D72829 Elevated white blood cell count, unspecified: Secondary | ICD-10-CM | POA: Diagnosis present

## 2021-04-17 LAB — BASIC METABOLIC PANEL
Anion gap: 12 (ref 5–15)
BUN: 12 mg/dL (ref 8–23)
CO2: 26 mmol/L (ref 22–32)
Calcium: 8.3 mg/dL — ABNORMAL LOW (ref 8.9–10.3)
Chloride: 100 mmol/L (ref 98–111)
Creatinine, Ser: 3.08 mg/dL — ABNORMAL HIGH (ref 0.44–1.00)
GFR, Estimated: 15 mL/min — ABNORMAL LOW (ref >60)
Glucose, Bld: 123 mg/dL — ABNORMAL HIGH (ref 70–99)
Potassium: 3.5 mmol/L (ref 3.5–5.1)
Sodium: 138 mmol/L (ref 135–145)

## 2021-04-17 LAB — CBC WITH DIFFERENTIAL/PLATELET
Abs Immature Granulocytes: 0.24 10*3/uL — ABNORMAL HIGH (ref 0.00–0.07)
Basophils Absolute: 0.1 10*3/uL (ref 0.0–0.1)
Basophils Relative: 0 %
Eosinophils Absolute: 0 10*3/uL (ref 0.0–0.5)
Eosinophils Relative: 0 %
HCT: 26.7 % — ABNORMAL LOW (ref 36.0–46.0)
Hemoglobin: 8.5 g/dL — ABNORMAL LOW (ref 12.0–15.0)
Immature Granulocytes: 1 %
Lymphocytes Relative: 2 %
Lymphs Abs: 0.5 10*3/uL — ABNORMAL LOW (ref 0.7–4.0)
MCH: 29.3 pg (ref 26.0–34.0)
MCHC: 31.8 g/dL (ref 30.0–36.0)
MCV: 92.1 fL (ref 80.0–100.0)
Monocytes Absolute: 1.9 10*3/uL — ABNORMAL HIGH (ref 0.1–1.0)
Monocytes Relative: 7 %
Neutro Abs: 26.6 10*3/uL — ABNORMAL HIGH (ref 1.7–7.7)
Neutrophils Relative %: 90 %
Platelets: 226 10*3/uL (ref 150–400)
RBC: 2.9 MIL/uL — ABNORMAL LOW (ref 3.87–5.11)
RDW: 16.4 % — ABNORMAL HIGH (ref 11.5–15.5)
Smear Review: NORMAL
WBC: 29.3 10*3/uL — ABNORMAL HIGH (ref 4.0–10.5)
nRBC: 0 % (ref 0.0–0.2)

## 2021-04-17 LAB — PROCALCITONIN: Procalcitonin: 1.88 ng/mL

## 2021-04-17 LAB — URINALYSIS, ROUTINE W REFLEX MICROSCOPIC
Bacteria, UA: NONE SEEN
Bilirubin Urine: NEGATIVE
Glucose, UA: 150 mg/dL — AB
Hgb urine dipstick: NEGATIVE
Ketones, ur: 5 mg/dL — AB
Leukocytes,Ua: NEGATIVE
Nitrite: NEGATIVE
Protein, ur: >=300 mg/dL — AB
Specific Gravity, Urine: 1.015 (ref 1.005–1.030)
pH: 7 (ref 5.0–8.0)

## 2021-04-17 LAB — LACTIC ACID, PLASMA
Lactic Acid, Venous: 1.3 mmol/L (ref 0.5–1.9)
Lactic Acid, Venous: 1.2 mmol/L (ref 0.5–1.9)

## 2021-04-17 LAB — MRSA PCR SCREENING: MRSA by PCR: NEGATIVE

## 2021-04-17 LAB — PHOSPHORUS: Phosphorus: 3.9 mg/dL (ref 2.5–4.6)

## 2021-04-17 IMAGING — DX DG CHEST 1V PORT
1 series · 1 of 1 positions shown · non-contrast
Comparison: Chest x-ray dated [DATE].

CLINICAL DATA: Fever and leukocytosis.

EXAM:
PORTABLE CHEST 1 VIEW

[chest ap]
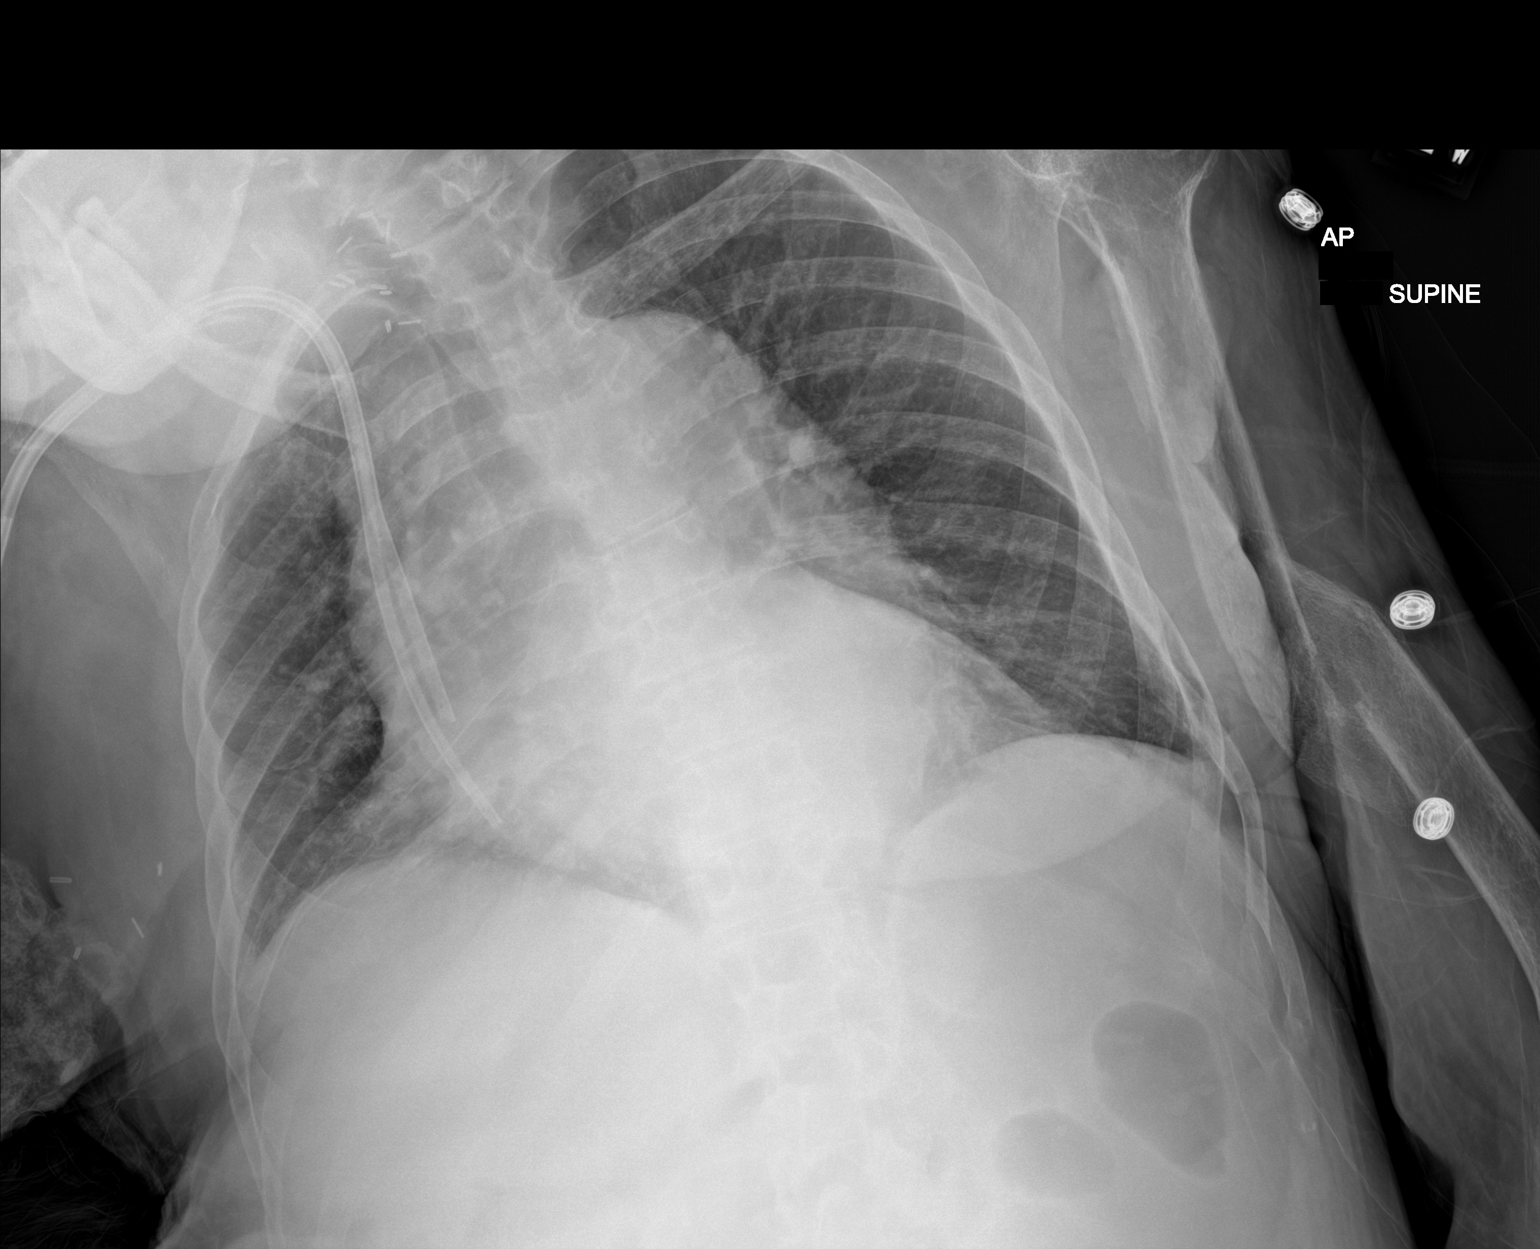

[1 of 1 positions shown; findings below may reference images not displayed]

FINDINGS: Patient is rotated to the right. New tunneled right internal jugular
dialysis catheter with distal lumen tip in the right atrium.
Unchanged cardiomegaly. Normal pulmonary vascularity. No focal
consolidation, pleural effusion, or pneumothorax. No acute osseous
abnormality. Old healed displaced fracture of the left humeral
diaphysis.
IMPRESSION: 1. No active disease.

## 2021-04-17 MED ORDER — VANCOMYCIN HCL 1500 MG/300ML IV SOLN
1500.0000 mg | Freq: Once | INTRAVENOUS | Status: AC
  Administered 2021-04-17: 1500 mg via INTRAVENOUS
  Filled 2021-04-17: qty 300

## 2021-04-17 MED ORDER — RENA-VITE PO TABS
1.0000 | ORAL_TABLET | Freq: Every day | ORAL | Status: DC
  Administered 2021-04-17 – 2021-04-23 (×7): 1 via ORAL
  Filled 2021-04-17 (×7): qty 1

## 2021-04-17 MED ORDER — SODIUM CHLORIDE 0.9 % IV SOLN
2.0000 g | INTRAVENOUS | Status: DC
  Filled 2021-04-17: qty 20

## 2021-04-17 MED ORDER — NEPRO/CARBSTEADY PO LIQD
237.0000 mL | Freq: Three times a day (TID) | ORAL | Status: DC
  Administered 2021-04-17 – 2021-04-21 (×8): 237 mL via ORAL

## 2021-04-17 MED ORDER — SODIUM CHLORIDE 0.9 % IV SOLN
500.0000 mg | Freq: Every day | INTRAVENOUS | Status: DC
  Administered 2021-04-17: 500 mg via INTRAVENOUS
  Filled 2021-04-17: qty 500

## 2021-04-17 MED ORDER — VANCOMYCIN HCL 750 MG/150ML IV SOLN
750.0000 mg | INTRAVENOUS | Status: DC
  Filled 2021-04-17: qty 150

## 2021-04-17 NOTE — Progress Notes (Signed)
Initial Nutrition Assessment  DOCUMENTATION CODES:   Not applicable  INTERVENTION:   Nepro Shake po TID, each supplement provides 425 kcal and 19 grams protein  Magic cup TID with meals, each supplement provides 290 kcal and 9 grams of protein  Rena-vit po daily   Dysphagia 1 diet   Pt at high refeed risk; recommend monitor potassium, magnesium and phosphorus labs daily until stable  NUTRITION DIAGNOSIS:   Increased nutrient needs related to chronic illness (ESRD on HD) as evidenced by estimated needs.  GOAL:   Patient will meet greater than or equal to 90% of their needs  MONITOR:   PO intake,Supplement acceptance,Labs,Weight trends,Skin,I & O's  REASON FOR ASSESSMENT:   Malnutrition Screening Tool    ASSESSMENT:   75 y.o. female with medical history significant for chronic kidney disease stage V and new HD, GERD, hypertension, hypothyroidism, non-insulin-dependent diabetes, hyperlipidemia, previous CVA, seizures and overactive bladder who is admitted with AMS.   Pt s/p new HD 5/6  Visited pt's room today. Pt with AMS and is unable to provide any nutrition related history. Per nurse tech report, pt ate some grits for breakfast this morning but she refused lunch. Nurse tech reports that patient was sucking on her bacon this morning and he feels pt may do better with pureed foods. Per chart review, pt eating 50-100% of her meals at the beginning of her admission but is now only eating sips and bites. Pt is able to drink some coffee and juice. RD will add supplements and vitamins to help pt meet her estimated needs. RD will downgrade patient to a pureed diet; pt would likely benefit from SLP evaluation at some point. Per chart, pt is down 20lbs since admit and 22lbs(14%) from her UBW.   Medications reviewed and include: aspirin, D3, epoetin, heparin, synthroid, meropenem, vancomycin  Labs reviewed: K 3.5 wnl, creat 3.08(H) Wbc- 29.3(H), Hgb 8.5(L), Hct 26.7(L)  NUTRITION  - FOCUSED PHYSICAL EXAM:  Flowsheet Row Most Recent Value  Orbital Region No depletion  Upper Arm Region Mild depletion  Thoracic and Lumbar Region No depletion  Buccal Region No depletion  Temple Region No depletion  Clavicle Bone Region No depletion  Clavicle and Acromion Bone Region No depletion  Scapular Bone Region No depletion  Dorsal Hand No depletion  Patellar Region Moderate depletion  Anterior Thigh Region Moderate depletion  Posterior Calf Region Moderate depletion  Edema (RD Assessment) Mild  Hair Reviewed  Eyes Reviewed  Mouth Reviewed  Skin Reviewed  Nails Reviewed     Diet Order:   Diet Order            DIET - DYS 1 Room service appropriate? Yes; Fluid consistency: Thin  Diet effective now                EDUCATION NEEDS:   No education needs have been identified at this time  Skin:  Skin Assessment: Reviewed RN Assessment  Last BM:  PTA  Height:   Ht Readings from Last 1 Encounters:  04/09/21 5\' 2"  (1.575 m)    Weight:   Wt Readings from Last 1 Encounters:  04/16/21 62 kg    Ideal Body Weight:  50 kg  BMI:  Body mass index is 24.98 kg/m.  Estimated Nutritional Needs:   Kcal:  1500-1700kcal/day  Protein:  75-85g/day  Fluid:  UOP +1L  Koleen Distance MS, RD, LDN Please refer to [facility] for RD and/or RD on-call/weekend/after hours pager

## 2021-04-17 NOTE — Progress Notes (Signed)
Pt temp elevated to 103.1 axillary. Tylenol suppository administered. Jennye Boroughs, MD notified via secure chat. Sepsis work up already in place.

## 2021-04-17 NOTE — Progress Notes (Signed)
[name]  ROUNDING NOTE   Subjective:   Patient seen sitting up in bed Alert and oriented to self Partially ate breakfast    Objective:  Vital signs in last 24 hours:  Temp:  [98.1 F (36.7 C)-101.8 F (38.8 C)] 98.1 F (36.7 C) (05/13 0820) Pulse Rate:  [56-117] 101 (05/13 0820) Resp:  [18-27] 18 (05/13 0820) BP: (119-174)/(63-105) 120/64 (05/13 0820) SpO2:  [98 %-100 %] 99 % (05/13 0820)  Weight change:  Filed Weights   04/14/21 0500 04/15/21 0500 04/16/21 0625  Weight: 61 kg 59.9 kg 62 kg    Intake/Output: I/O last 3 completed shifts: In: 296.9 [P.O.:200; IV Piggyback:96.9] Out: 2000 [Other:2000]   Intake/Output this shift:  No intake/output data recorded.  Physical Exam: General: NAD, resting in bed  Head: Normocephalic, atraumatic. Moist oral mucosal membranes  Eyes: Anicteric  Lungs:  Diminished in bases  Heart: Regular rhythm and rate  Abdomen:  Soft, nontender,  distended  Extremities:  no peripheral edema.  Neurologic: Alert and oriented to self  Skin: No lesions  Access: RIJ permcath 5/6 Dr. Lucky Cowboy    Basic Metabolic Panel: Recent Labs  Lab 04/13/21 1150 04/15/21 1007 04/16/21 1114 04/16/21 1210 04/17/21 0415  NA 138 137  --  137 138  K 2.9* 3.4*  --  3.1* 3.5  CL 99 100  --  96* 100  CO2 27 27  --  27 26  GLUCOSE 101* 125*  --  119* 123*  BUN 13 11  --  <5* 12  CREATININE 2.02* 3.00*  --  1.17* 3.08*  CALCIUM 8.3* 7.9*  --  8.6* 8.3*  MG  --  1.8 1.8  --   --   PHOS 2.2*  --   --  1.4* 3.9    Liver Function Tests: Recent Labs  Lab 04/13/21 1150 04/16/21 1210  ALBUMIN 3.2* 3.2*   No results for input(s): LIPASE, AMYLASE in the last 168 hours. No results for input(s): AMMONIA in the last 168 hours.  CBC: Recent Labs  Lab 04/11/21 0758 04/13/21 1150 04/15/21 1007 04/16/21 1212 04/17/21 0415  WBC  --  15.1* 11.2* 18.2* 29.3*  NEUTROABS  --   --  8.5*  --  26.6*  HGB 6.8* 9.1* 9.1* 9.6* 8.5*  HCT 20.4* 27.1*  27.0* 29.4* 26.7*  MCV  --  85.8 88.2 88.3 92.1  PLT  --  202 206 257 226    Cardiac Enzymes: No results for input(s): CKTOTAL, CKMB, CKMBINDEX, TROPONINI in the last 168 hours.  BNP: Invalid input(s): POCBNP  CBG: Recent Labs  Lab 04/13/21 1615 04/13/21 2058 04/14/21 0724 04/14/21 1124 04/14/21 2144  GLUCAP 93 127* 82 126* 95    Microbiology: Results for orders placed or performed during the hospital encounter of 04/09/21  Resp Panel by RT-PCR (Flu A&B, Covid) Nasopharyngeal Swab     Status: None   Collection Time: 04/09/21  5:58 PM   Specimen: Nasopharyngeal Swab; Nasopharyngeal(NP) swabs in vial transport medium  Result Value Ref Range Status   SARS Coronavirus 2 by RT PCR NEGATIVE NEGATIVE Final    Comment: (NOTE) SARS-CoV-2 target nucleic acids are NOT DETECTED.  The SARS-CoV-2 RNA is generally detectable in upper respiratory specimens during the acute phase of infection. The lowest concentration of SARS-CoV-2 viral copies this assay can detect is 138 copies/mL. A negative result does not preclude SARS-Cov-2 infection and should not be used as the sole basis for treatment or other patient management decisions. A  negative result may occur with  improper specimen collection/handling, submission of specimen other than nasopharyngeal swab, presence of viral mutation(s) within the areas targeted by this assay, and inadequate number of viral copies(<138 copies/mL). A negative result must be combined with clinical observations, patient history, and epidemiological information. The expected result is Negative.  Fact Sheet for Patients:  EntrepreneurPulse.com.au  Fact Sheet for Healthcare Providers:  IncredibleEmployment.be  This test is no t yet approved or cleared by the [place] FDA and  has been authorized for detection and/or diagnosis of SARS-CoV-2 by FDA under an Emergency Use Authorization (EUA). This EUA will remain   in effect (meaning this test can be used) for the duration of the COVID-19 declaration under Section 564(b)(1) of the Act, 21 U.S.C.section 360bbb-3(b)(1), unless the authorization is terminated  or revoked sooner.       Influenza A by PCR NEGATIVE NEGATIVE Final   Influenza B by PCR NEGATIVE NEGATIVE Final    Comment: (NOTE) The Xpert Xpress SARS-CoV-2/FLU/RSV plus assay is intended as an aid in the diagnosis of influenza from Nasopharyngeal swab specimens and should not be used as a sole basis for treatment. Nasal washings and aspirates are unacceptable for Xpert Xpress SARS-CoV-2/FLU/RSV testing.  Fact Sheet for Patients: EntrepreneurPulse.com.au  Fact Sheet for Healthcare Providers: IncredibleEmployment.be  This test is not yet approved or cleared by the [place] FDA and has been authorized for detection and/or diagnosis of SARS-CoV-2 by FDA under an Emergency Use Authorization (EUA). This EUA will remain in effect (meaning this test can be used) for the duration of the COVID-19 declaration under Section 564(b)(1) of the Act, 21 U.S.C. section 360bbb-3(b)(1), unless the authorization is terminated or revoked.  Performed at [facility], [address] [place], [place] [address]     Coagulation Studies: No results for input(s): LABPROT, INR in the last 72 hours.  Urinalysis: No results for input(s): COLORURINE, LABSPEC, PHURINE, GLUCOSEU, HGBUR, BILIRUBINUR, KETONESUR, PROTEINUR, UROBILINOGEN, NITRITE, LEUKOCYTESUR in the last 72 hours.  Invalid input(s): APPERANCEUR    Imaging: No results found.   Medications:   . levETIRAcetam 500 mg (04/16/21 2246)   . amLODipine  10 mg Oral Daily  . aspirin  300 mg Rectal Daily  . Chlorhexidine Gluconate Cloth  6 each Topical Q0600  . cholecalciferol  1,000 Units Oral Daily  . cloNIDine  0.3 mg Transdermal Weekly  . epoetin (EPOGEN/PROCRIT) injection  4,000 Units  Intravenous Q T,Th,Sa-HD  . heparin  5,000 Units Subcutaneous Q8H  . latanoprost  1 drop Both Eyes QHS  . letrozole  2.5 mg Oral Daily  . levothyroxine  112 mcg Oral Q0600  . metoprolol succinate  50 mg Oral Daily   acetaminophen **OR** acetaminophen, calcium carbonate, camphor-menthol **AND** hydrOXYzine, docusate sodium, feeding supplement (NEPRO CARB STEADY), hydrALAZINE, LORazepam, ondansetron **OR** ondansetron (ZOFRAN) IV, sorbitol  Assessment/ Plan:  Kirsten Mcmahon is a 75 y.o.  female  with past medical history of GERD, Hypertension, diabetes, hyperlipidemia, CVA and CKD stage 4. She presents to the ED with increased lower extremity edema. She is known to our team from previous admissions.   1. End Stage Renal Disease requiring new start hemodialysis:  First dialysis treatment 5/6.   -Received dialysis yesterday - UF goal 2L achieved - Next treatment scheduled for Saturday - Outpatient planning: Dialysis liaison notified of outpatient center acceptance at [facility], MWF, 1115. Son notified of this.    2. Anemia of chronic kidney disease  Lab Results  Component  Value Date   HGB 8.5 (L) 04/17/2021   EPO will begin with treatments   3. Secondary Hyperparathyroidism: with hypocalcemia   Lab Results  Component Value Date   PTH 161 (H) 01/18/2021   CALCIUM 8.3 (L) 04/17/2021   PHOS 3.9 04/17/2021  Phosphorus at target Poor oral intake  4.Diabetes mellitus type II with chronic kidney disease noninsulin dependent.  hemoglobin A1c is 5.7 on 01/18/21.  Stable  5. Hypertension: elevated with volume overload. Home regimen of clonidine, amlodipine, metoprolol.    LOS: 8   5/13/20229:37 AM

## 2021-04-17 NOTE — TOC Progression Note (Signed)
Transition of Care [facility]) - Progression Note    Patient Details  Name: SHIRLE PROVENCAL MRN: [id number] Date of Birth: [birth year]  Transition of Care [facility]) CM/SW [place], LCSW Phone Number: 04/17/2021, 11:33 AM  Clinical Narrative:  RN said son called her this morning and expressed he is still not interested in SNF placement. Per MD, son interested in extra assistance at home. [facility] is unable to provide since she is moving out of their service area. CSW called son to notify. Discussed potential personal care services. He will call around to get prices. Son plans on driving her home in the car when released. He asked if some of her medications may be causing her confusion/combativeness because he stated she was not this way at home. Sent secure chat to MD to notify him of his question.  Expected Discharge Plan: [place] Barriers to Discharge: Continued Medical Work up  Expected Discharge Plan and Services Expected Discharge Plan: [place] arrangements for the past 2 months: Single Family Home                                       Social Determinants of Health (SDOH) Interventions    Readmission Risk Interventions Readmission Risk Prevention Plan 04/11/2021  Transportation Screening Complete  PCP or Specialist Appt within 3-5 Days Complete  HRI or [place] Complete  Social Work Consult for [place] Planning/Counseling Complete  Palliative Care Screening Not Applicable  Medication Review Press photographer) Complete  Some recent data might be hidden

## 2021-04-17 NOTE — Consult Note (Addendum)
Pharmacy Antibiotic Note  Kirsten Mcmahon is a 75 y.o. female with medical history including ESRD, diabetes, CVA, seizures, GERD, hypertension, hypothyroidism admitted on 04/09/2021 with renal failure. Admission complicated by encephalopathy / delirium in setting of underlying cognitive impairment. WBC count has been trending up for several days and patient spiked fever 5/12 evening to 101.8 and now again today to 103.1. Blood cultures have been sent off. Pharmacy has been consulted for vancomycin and meropenem dosing for suspected infection. Etiology not entirely clear at this time.   Patient was started on HD this admission and has been receiving dialysis on Tu/Th/Sa schedule. Will be transitioning to a MWF schedule as outpatient.   Plan:  Meropenem 500 mg IV q24h --If concern for CNS infection this should be increased to 1000 mg IV q24h --Watch closely for seizures while on meropenem given history  Vancomycin 1.5 g IV LD x 1 followed by maintenance dose of vancomycin 750 mg IV with HD --Levels at steady state as indicated --Continue to follow HD schedule / RRT plan. If bacteremia / infected line requiring removal of dialysis access will change to dosing per levels --Continue to follow culture results / infectious work-up  Height: 5\' 2"  (157.5 cm) Weight: 62 kg (136 lb 9.6 oz) IBW/kg (Calculated) : 50.1  Temp (24hrs), Avg:99 F (37.2 C), Min:98.1 F (36.7 C), Max:101.8 F (38.8 C)  Recent Labs  Lab 04/13/21 1150 04/15/21 1007 04/16/21 1210 04/16/21 1212 04/17/21 0415 04/17/21 1158 04/17/21 1354  WBC 15.1* 11.2*  --  18.2* 29.3*  --   --   CREATININE 2.02* 3.00* 1.17*  --  3.08*  --   --   LATICACIDVEN  --   --   --   --   --  1.3 1.2    Estimated Creatinine Clearance: 13.5 mL/min (A) (by C-G formula based on SCr of 3.08 mg/dL (H)).    Allergies  Allergen Reactions  . Contrast Media [Iodinated Diagnostic Agents]     Other reaction(s): NO ALLERGY  . Latex     Other  reaction(s): NO ALLERGY  . Shellfish-Derived Products     Other reaction(s): NO ALLERGY  . Levemir [Insulin Detemir] Itching    Antimicrobials this admission: Vancomycin 5/13 >>  Ceftriaxone 5/13 >>   Dose adjustments this admission: N/A  Microbiology results: 5/13 BCx: pending 5/13 MRSA PCR: pending  Thank you for allowing pharmacy to be a part of this patient's care.  Benita Gutter 04/17/2021 2:37 PM

## 2021-04-17 NOTE — Progress Notes (Addendum)
Progress Note    Kirsten Mcmahon  OIN:[id number] DOB: 1946-04-01  DOA: 04/09/2021 PCP: [facility]      Brief Narrative:    Medical records reviewed and are as summarized below:  Kirsten Mcmahon is a 75 y.o. female with medical history significant for CKD stage V, hypertension, hypothyroidism, diabetes mellitus, hyperlipidemia, history of stroke, seizure disorder, overactive bladder.  She presented to the hospital because of increased swelling of the lower extremities despite taking diuretics.  In the emergency room, she was noted to have a creatinine of 7.99 which was much higher than her previous creatinine of 3.99 (2 months prior).  She was admitted to the hospital for AKI on CKD stage V.  She was evaluated by the nephrologist.  Vascular surgeon was consulted and a right IJ tunneled hemodialysis permacath was placed on 04/10/2021.  Hemodialysis was initiated on 04/10/2021.      Assessment/Plan:   Principal Problem:   Acute renal failure superimposed on stage 3b chronic kidney disease (HCC) Active Problems:   Hypertension   Hypothyroidism   GERD (gastroesophageal reflux disease)   Anemia in chronic kidney disease (CKD)   Type II diabetes mellitus with renal manifestations ([place])   ESRD needing dialysis ([place])   Leukocytosis   Fever   Body mass index is 24.98 kg/m.     ESRD: Hemodialysis was initiated on 04/10/2021.  She will continue hemodialysis as an outpatient.  She now has an outpatient hemodialysis chair at [facility] on Mondays, Wednesdays and Fridays.  Her son said he will be able to transport her to hemodialysis.  Fever, worsening leukocytosis: Suspect underlying infection/sepsis.  Obtain chest x-ray, urinalysis, lactic acid, procalcitonin and blood cultures for further evaluation.  Patient had refused chest x-ray earlier this morning.  We will have radiology team attempt to obtain chest x-ray again.  Start empiric IV antibiotics after  collection of urine and blood culture.  Discussed initiation of IV fluids with nephrologist, Dr. Juleen China.  However, he was concerned that patient had a lot of fluid when she first came and it took a while to get fluid off with dialysis.  He recommended holding off on IV fluids for now since patient is not hypotensive.  Confusion, suspected metabolic encephalopathy/delirium, underlying cognitive impairment: According to her son, patient has had chronic confusion since her fall in February 2022.  He said she has never been the same since her fall.  He said she used to be able to "work the phone" but since that fall, she has not been able to use a phone to make a call.     Hypokalemia and hypophosphatemia: Improved  Left lower extremity edema: No evidence of DVT.  Anemia of chronic disease: S/p transfusion with 1 unit of PRBCs on 04/11/2021.  Seizure disorder: Continue IV Keppra.  Hypertension: Continue clonidine patch and other antihypertensives as able  Type II DM: NovoLog as needed for hyperglycemia.  CAD, history of stroke with left-sided weakness: Continue aspirin   Diet Order            Diet renal with fluid restriction Fluid restriction: 1200 mL Fluid; Room service appropriate? Yes; Fluid consistency: Thin  Diet effective now                    Consultants:  Vascular surgeon  Nephrologist  Procedures:  Right IJ permacath placed on 04/10/2021    Medications:   . amLODipine  10 mg Oral Daily  .  aspirin  300 mg Rectal Daily  . Chlorhexidine Gluconate Cloth  6 each Topical Q0600  . cholecalciferol  1,000 Units Oral Daily  . cloNIDine  0.3 mg Transdermal Weekly  . epoetin (EPOGEN/PROCRIT) injection  4,000 Units Intravenous Q T,Th,Sa-HD  . heparin  5,000 Units Subcutaneous Q8H  . latanoprost  1 drop Both Eyes QHS  . letrozole  2.5 mg Oral Daily  . levothyroxine  112 mcg Oral Q0600  . metoprolol succinate  50 mg Oral Daily   Continuous Infusions: . cefTRIAXone  (ROCEPHIN)  IV    . levETIRAcetam 500 mg (04/16/21 2246)     Anti-infectives (From admission, onward)   Start     Dose/Rate Route Frequency Ordered Stop   04/17/21 1500  cefTRIAXone (ROCEPHIN) 2 g in sodium chloride 0.9 % 100 mL IVPB       Note to Pharmacy: Antibiotics to be given after urine has been collected for urinalysis   2 g 200 mL/hr over 30 Minutes Intravenous Every 24 hours 04/17/21 1416     04/11/21 0600  ceFAZolin (ANCEF) 1 g in sodium chloride 0.9 % 100 mL IVPB       Note to Pharmacy: To be given in specials   1 g 200 mL/hr over 30 Minutes Intravenous On call to O.R. 04/10/21 1432 04/10/21 1546             Family Communication/Anticipated D/C date and plan/Code Status   DVT prophylaxis: heparin injection 5,000 Units Start: 04/09/21 2200     Code Status: Full Code  Family Communication: [employer], but there was  No response. Left a voicemail Disposition Plan:    Status is: Inpatient  Remains inpatient appropriate because:IV treatments appropriate due to intensity of illness or inability to take PO and Inpatient level of care appropriate due to severity of illness   Dispo: The patient is from: Home              Anticipated d/c is to: Home              Patient currently is not medically stable to d/c.   Difficult to place patient No           Subjective:   Interval events noted.  She had fever with T-max of 101.8 F last night.  Confused and unable to provide any history.  Objective:    Vitals:   04/17/21 0219 04/17/21 0358 04/17/21 0820 04/17/21 1114  BP:  123/68 120/64 (!) 115/53  Pulse:  (!) 103 (!) 101 (!) 102  Resp:  20 18 20   Temp: 99 F (37.2 C) 99.4 F (37.4 C) 98.1 F (36.7 C) 98.3 F (36.8 C)  TempSrc: Axillary Axillary Oral Oral  SpO2:  100% 99% 96%  Weight:      Height:       No data found.   Intake/Output Summary (Last 24 hours) at 04/17/2021 1421 Last data filed at 04/17/2021 1352 Gross per 24 hour   Intake 160 ml  Output 50 ml  Net 110 ml   Filed Weights   04/14/21 0500 04/15/21 0500 04/16/21 0625  Weight: 61 kg 59.9 kg 62 kg    Exam:  GEN: NAD SKIN: Warm and dry EYES: No pallor or icterus ENT: MMM CV: RRR PULM: CTA B ABD: soft, obese, NT, +BS CNS: Alert and oriented to person only.  Left-sided weakness. EXT: No edema or tenderness          Data Reviewed:   I have  personally reviewed following labs and imaging studies:  Labs: Labs show the following:   Basic Metabolic Panel: Recent Labs  Lab 04/13/21 1150 04/15/21 1007 04/16/21 1114 04/16/21 1210 04/17/21 0415  NA 138 137  --  137 138  K 2.9* 3.4*  --  3.1* 3.5  CL 99 100  --  96* 100  CO2 27 27  --  27 26  GLUCOSE 101* 125*  --  119* 123*  BUN 13 11  --  <5* 12  CREATININE 2.02* 3.00*  --  1.17* 3.08*  CALCIUM 8.3* 7.9*  --  8.6* 8.3*  MG  --  1.8 1.8  --   --   PHOS 2.2*  --   --  1.4* 3.9   GFR Estimated Creatinine Clearance: 13.5 mL/min (A) (by C-G formula based on SCr of 3.08 mg/dL (H)). Liver Function Tests: Recent Labs  Lab 04/13/21 1150 04/16/21 1210  ALBUMIN 3.2* 3.2*   No results for input(s): LIPASE, AMYLASE in the last 168 hours. No results for input(s): AMMONIA in the last 168 hours. Coagulation profile No results for input(s): INR, PROTIME in the last 168 hours.  CBC: Recent Labs  Lab 04/11/21 0758 04/13/21 1150 04/15/21 1007 04/16/21 1212 04/17/21 0415  WBC  --  15.1* 11.2* 18.2* 29.3*  NEUTROABS  --   --  8.5*  --  26.6*  HGB 6.8* 9.1* 9.1* 9.6* 8.5*  HCT 20.4* 27.1* 27.0* 29.4* 26.7*  MCV  --  85.8 88.2 88.3 92.1  PLT  --  202 206 257 226   Cardiac Enzymes: No results for input(s): CKTOTAL, CKMB, CKMBINDEX, TROPONINI in the last 168 hours. BNP (last 3 results) No results for input(s): PROBNP in the last 8760 hours. CBG: Recent Labs  Lab 04/13/21 1615 04/13/21 2058 04/14/21 0724 04/14/21 1124 04/14/21 2144  GLUCAP 93 127* 82 126* 95   D-Dimer: No  results for input(s): DDIMER in the last 72 hours. Hgb A1c: No results for input(s): HGBA1C in the last 72 hours. Lipid Profile: No results for input(s): CHOL, HDL, LDLCALC, TRIG, CHOLHDL, LDLDIRECT in the last 72 hours. Thyroid function studies: No results for input(s): TSH, T4TOTAL, T3FREE, THYROIDAB in the last 72 hours.  Invalid input(s): FREET3 Anemia work up: No results for input(s): VITAMINB12, FOLATE, FERRITIN, TIBC, IRON, RETICCTPCT in the last 72 hours. Sepsis Labs: Recent Labs  Lab 04/13/21 1150 04/15/21 1007 04/16/21 1212 04/17/21 0415 04/17/21 1158  PROCALCITON  --   --   --   --  1.88  WBC 15.1* 11.2* 18.2* 29.3*  --   LATICACIDVEN  --   --   --   --  1.3    Microbiology Recent Results (from the past 240 hour(s))  Resp Panel by RT-PCR (Flu A&B, Covid) Nasopharyngeal Swab     Status: None   Collection Time: 04/09/21  5:58 PM   Specimen: Nasopharyngeal Swab; Nasopharyngeal(NP) swabs in vial transport medium  Result Value Ref Range Status   SARS Coronavirus 2 by RT PCR NEGATIVE NEGATIVE Final    Comment: (NOTE) SARS-CoV-2 target nucleic acids are NOT DETECTED.  The SARS-CoV-2 RNA is generally detectable in upper respiratory specimens during the acute phase of infection. The lowest concentration of SARS-CoV-2 viral copies this assay can detect is 138 copies/mL. A negative result does not preclude SARS-Cov-2 infection and should not be used as the sole basis for treatment or other patient management decisions. A negative result may occur with  improper specimen collection/handling, submission of specimen  other than nasopharyngeal swab, presence of viral mutation(s) within the areas targeted by this assay, and inadequate number of viral copies(<138 copies/mL). A negative result must be combined with clinical observations, patient history, and epidemiological information. The expected result is Negative.  Fact Sheet for Patients:   EntrepreneurPulse.com.au  Fact Sheet for Healthcare Providers:  IncredibleEmployment.be  This test is no t yet approved or cleared by the [place] FDA and  has been authorized for detection and/or diagnosis of SARS-CoV-2 by FDA under an Emergency Use Authorization (EUA). This EUA will remain  in effect (meaning this test can be used) for the duration of the COVID-19 declaration under Section 564(b)(1) of the Act, 21 U.S.C.section 360bbb-3(b)(1), unless the authorization is terminated  or revoked sooner.       Influenza A by PCR NEGATIVE NEGATIVE Final   Influenza B by PCR NEGATIVE NEGATIVE Final    Comment: (NOTE) The Xpert Xpress SARS-CoV-2/FLU/RSV plus assay is intended as an aid in the diagnosis of influenza from Nasopharyngeal swab specimens and should not be used as a sole basis for treatment. Nasal washings and aspirates are unacceptable for Xpert Xpress SARS-CoV-2/FLU/RSV testing.  Fact Sheet for Patients: EntrepreneurPulse.com.au  Fact Sheet for Healthcare Providers: IncredibleEmployment.be  This test is not yet approved or cleared by the [place] FDA and has been authorized for detection and/or diagnosis of SARS-CoV-2 by FDA under an Emergency Use Authorization (EUA). This EUA will remain in effect (meaning this test can be used) for the duration of the COVID-19 declaration under Section 564(b)(1) of the Act, 21 U.S.C. section 360bbb-3(b)(1), unless the authorization is terminated or revoked.  Performed at [facility], [address] [place], [place] [address]     Procedures and diagnostic studies:  No results found.             LOS: 8 days   Pax Reasoner  [facility]   Pager on [email or web address]. If 7PM-7AM, please contact night-coverage at [email or web address]     04/17/2021, 2:21 PM

## 2021-04-17 NOTE — Progress Notes (Signed)
PT Cancellation Note  Patient Details Name: Kirsten Mcmahon MRN: [id number] DOB: 06-18-1946   Cancelled Treatment:     PT attempt. Pt becomes combative with any attempts to mobilize. Pt is unwilling to participate. Per RN tech, refused eating lunch and was slightly agitated. Acute PT will return when pt is more appropriate to participate.    Willette Pa 04/17/2021, 1:41 PM

## 2021-04-17 NOTE — Progress Notes (Signed)
OT Cancellation Note  Patient Details Name: Kirsten Mcmahon MRN: [id number] DOB: [birth year]   Cancelled Treatment:    Reason Eval/Treat Not Completed: Other (comment). Per PTA, pt combative, unable to safely participate in therapy at this time. Will re-attempt at later date as pt is appropriate.   Hanley Hays, MPH, MS, OTR/L ascom [phone] 04/17/21, 1:19 PM

## 2021-04-18 DIAGNOSIS — N171 Acute kidney failure with acute cortical necrosis: Secondary | ICD-10-CM | POA: Diagnosis not present

## 2021-04-18 DIAGNOSIS — A409 Streptococcal sepsis, unspecified: Secondary | ICD-10-CM | POA: Diagnosis not present

## 2021-04-18 DIAGNOSIS — Z992 Dependence on renal dialysis: Secondary | ICD-10-CM | POA: Diagnosis not present

## 2021-04-18 DIAGNOSIS — R7881 Bacteremia: Secondary | ICD-10-CM

## 2021-04-18 DIAGNOSIS — N1832 Chronic kidney disease, stage 3b: Secondary | ICD-10-CM | POA: Diagnosis not present

## 2021-04-18 DIAGNOSIS — B955 Unspecified streptococcus as the cause of diseases classified elsewhere: Secondary | ICD-10-CM | POA: Diagnosis not present

## 2021-04-18 DIAGNOSIS — N186 End stage renal disease: Secondary | ICD-10-CM | POA: Diagnosis not present

## 2021-04-18 LAB — CBC WITH DIFFERENTIAL/PLATELET
Abs Immature Granulocytes: 0.16 10*3/uL — ABNORMAL HIGH (ref 0.00–0.07)
Basophils Absolute: 0 10*3/uL (ref 0.0–0.1)
Basophils Relative: 0 %
Eosinophils Absolute: 0 10*3/uL (ref 0.0–0.5)
Eosinophils Relative: 0 %
HCT: 24.7 % — ABNORMAL LOW (ref 36.0–46.0)
Hemoglobin: 7.9 g/dL — ABNORMAL LOW (ref 12.0–15.0)
Immature Granulocytes: 1 %
Lymphocytes Relative: 3 %
Lymphs Abs: 0.6 10*3/uL — ABNORMAL LOW (ref 0.7–4.0)
MCH: 29.6 pg (ref 26.0–34.0)
MCHC: 32 g/dL (ref 30.0–36.0)
MCV: 92.5 fL (ref 80.0–100.0)
Monocytes Absolute: 1.1 10*3/uL — ABNORMAL HIGH (ref 0.1–1.0)
Monocytes Relative: 7 %
Neutro Abs: 15.3 10*3/uL — ABNORMAL HIGH (ref 1.7–7.7)
Neutrophils Relative %: 89 %
Platelets: 209 10*3/uL (ref 150–400)
RBC: 2.67 MIL/uL — ABNORMAL LOW (ref 3.87–5.11)
RDW: 16.3 % — ABNORMAL HIGH (ref 11.5–15.5)
WBC: 17.2 10*3/uL — ABNORMAL HIGH (ref 4.0–10.5)
nRBC: 0 % (ref 0.0–0.2)

## 2021-04-18 LAB — BLOOD CULTURE ID PANEL (REFLEXED) - BCID2

## 2021-04-18 LAB — MAGNESIUM: Magnesium: 1.9 mg/dL (ref 1.7–2.4)

## 2021-04-18 LAB — BASIC METABOLIC PANEL
Anion gap: 10 (ref 5–15)
BUN: 28 mg/dL — ABNORMAL HIGH (ref 8–23)
CO2: 28 mmol/L (ref 22–32)
Calcium: 8.1 mg/dL — ABNORMAL LOW (ref 8.9–10.3)
Chloride: 100 mmol/L (ref 98–111)
Creatinine, Ser: 4.89 mg/dL — ABNORMAL HIGH (ref 0.44–1.00)
GFR, Estimated: 9 mL/min — ABNORMAL LOW (ref >60)
Glucose, Bld: 185 mg/dL — ABNORMAL HIGH (ref 70–99)
Potassium: 3.6 mmol/L (ref 3.5–5.1)
Sodium: 138 mmol/L (ref 135–145)

## 2021-04-18 LAB — PROCALCITONIN: Procalcitonin: 5.2 ng/mL

## 2021-04-18 LAB — PHOSPHORUS: Phosphorus: 3.2 mg/dL (ref 2.5–4.6)

## 2021-04-18 MED ORDER — LEVETIRACETAM 500 MG PO TABS
500.0000 mg | ORAL_TABLET | Freq: Every day | ORAL | Status: DC
  Administered 2021-04-18 – 2021-04-23 (×6): 500 mg via ORAL
  Filled 2021-04-18 (×6): qty 1

## 2021-04-18 MED ORDER — LEVETIRACETAM ER 500 MG PO TB24
500.0000 mg | ORAL_TABLET | Freq: Every day | ORAL | Status: DC
  Filled 2021-04-18: qty 1

## 2021-04-18 MED ORDER — SODIUM CHLORIDE 0.9 % IV SOLN
2.0000 g | INTRAVENOUS | Status: DC
  Administered 2021-04-18 – 2021-04-20 (×3): 2 g via INTRAVENOUS
  Filled 2021-04-18 (×2): qty 20
  Filled 2021-04-18 (×2): qty 2

## 2021-04-18 NOTE — Progress Notes (Signed)
PHARMACY - PHYSICIAN COMMUNICATION CRITICAL VALUE ALERT - BLOOD CULTURE IDENTIFICATION (BCID)  Kirsten Mcmahon is an 75 y.o. female who presented to [facility] on 04/09/2021 with a chief complaint of fever  Assessment:  Strep species in 4 of 4 bottles, no resistance (include suspected source if known)  Name of physician (or Provider) Contacted: Prudy Feeler   Current antibiotics: Meropenem, Vanc   Changes to prescribed antibiotics recommended:  Will d/c vanc, meropenem and start ceftriaxone 2 gm IV Q24H.   Results for orders placed or performed during the hospital encounter of 04/09/21  Blood Culture ID Panel (Reflexed) (Collected: 04/17/2021 11:58 AM)  Result Value Ref Range   Enterococcus faecalis NOT DETECTED NOT DETECTED   Enterococcus Faecium NOT DETECTED NOT DETECTED   Listeria monocytogenes NOT DETECTED NOT DETECTED   Staphylococcus species NOT DETECTED NOT DETECTED   Staphylococcus aureus (BCID) NOT DETECTED NOT DETECTED   Staphylococcus epidermidis NOT DETECTED NOT DETECTED   Staphylococcus lugdunensis NOT DETECTED NOT DETECTED   Streptococcus species DETECTED (A) NOT DETECTED   Streptococcus agalactiae NOT DETECTED NOT DETECTED   Streptococcus pneumoniae NOT DETECTED NOT DETECTED   Streptococcus pyogenes NOT DETECTED NOT DETECTED   A.calcoaceticus-baumannii NOT DETECTED NOT DETECTED   Bacteroides fragilis NOT DETECTED NOT DETECTED   Enterobacterales NOT DETECTED NOT DETECTED   Enterobacter cloacae complex NOT DETECTED NOT DETECTED   Escherichia coli NOT DETECTED NOT DETECTED   Klebsiella aerogenes NOT DETECTED NOT DETECTED   Klebsiella oxytoca NOT DETECTED NOT DETECTED   Klebsiella pneumoniae NOT DETECTED NOT DETECTED   Proteus species NOT DETECTED NOT DETECTED   Salmonella species NOT DETECTED NOT DETECTED   Serratia marcescens NOT DETECTED NOT DETECTED   Haemophilus influenzae NOT DETECTED NOT DETECTED   Neisseria meningitidis NOT DETECTED NOT DETECTED   Pseudomonas  aeruginosa NOT DETECTED NOT DETECTED   Stenotrophomonas maltophilia NOT DETECTED NOT DETECTED   Candida albicans NOT DETECTED NOT DETECTED   Candida auris NOT DETECTED NOT DETECTED   Candida glabrata NOT DETECTED NOT DETECTED   Candida krusei NOT DETECTED NOT DETECTED   Candida parapsilosis NOT DETECTED NOT DETECTED   Candida tropicalis NOT DETECTED NOT DETECTED   Cryptococcus neoformans/gattii NOT DETECTED NOT DETECTED    Harutyun Monteverde D 04/18/2021  1:26 AM

## 2021-04-18 NOTE — Progress Notes (Addendum)
Progress Note    Kirsten Mcmahon  SWN:[id number] DOB: [birth year]  DOA: 04/09/2021 PCP: [facility]      Brief Narrative:    Medical records reviewed and are as summarized below:  Kirsten Mcmahon is a 75 y.o. female with medical history significant for CKD stage V, hypertension, hypothyroidism, diabetes mellitus, hyperlipidemia, history of stroke, seizure disorder, overactive bladder.  She presented to the hospital because of increased swelling of the lower extremities despite taking diuretics.  In the emergency room, she was noted to have a creatinine of 7.99 which was much higher than her previous creatinine of 3.99 (2 months prior).  She was admitted to the hospital for AKI on CKD stage V.  She was evaluated by the nephrologist.  Vascular surgeon was consulted and a right IJ tunneled hemodialysis permacath was placed on 04/10/2021.  Hemodialysis was initiated on 04/10/2021.      Assessment/Plan:   Principal Problem:   Acute renal failure superimposed on stage 3b chronic kidney disease (HCC) Active Problems:   Hypertension   Hypothyroidism   GERD (gastroesophageal reflux disease)   Anemia in chronic kidney disease (CKD)   Type II diabetes mellitus with renal manifestations ([place])   ESRD needing dialysis ([place])   Leukocytosis   Fever   Streptococcal sepsis, unspecified (HCC)   Streptococcal bacteremia   Body mass index is 24.47 kg/m.     ESRD: Hemodialysis was initiated on 04/10/2021.  She will continue hemodialysis as an outpatient.  She has an outpatient hemodialysis chair at [facility] on Mondays, Wednesdays and Fridays.  Her son said he will be able to transport her to hemodialysis.  Streptococcal sepsis and bacteremia: 4 out of 4 blood culture bottles positive strep species.  No evidence of pneumonia on chest x-ray and no evidence of UTI on urinalysis.  IV ceftriaxone have been substituted for vancomycin and meropenem.    Confusion,  suspected metabolic encephalopathy/delirium, underlying cognitive impairment: According to her son, patient has had chronic confusion since her fall in February 2022.  He said she has never been the same since her fall.  He said she used to be able to "work the phone" but since that fall, she has not been able to use a phone to make a call.     Hypokalemia and hypophosphatemia: Improved  Left lower extremity edema: No evidence of DVT.  Anemia of chronic disease: S/p transfusion with 1 unit of PRBCs on 04/11/2021.  Seizure disorder: Changed IV to oral Keppra  Hypertension: Continue clonidine patch and other antihypertensives as able  Type II DM: NovoLog as needed for hyperglycemia.  CAD, history of stroke with left-sided weakness: Continue aspirin   Diet Order            DIET - DYS 1 Room service appropriate? Yes; Fluid consistency: Thin  Diet effective now                    Consultants:  Vascular surgeon  Nephrologist  Procedures:  Right IJ permacath placed on 04/10/2021    Medications:   . amLODipine  10 mg Oral Daily  . aspirin  300 mg Rectal Daily  . Chlorhexidine Gluconate Cloth  6 each Topical Q0600  . cholecalciferol  1,000 Units Oral Daily  . cloNIDine  0.3 mg Transdermal Weekly  . epoetin (EPOGEN/PROCRIT) injection  4,000 Units Intravenous Q T,Th,Sa-HD  . feeding supplement (NEPRO CARB STEADY)  237 mL Oral TID BM  .  heparin  5,000 Units Subcutaneous Q8H  . latanoprost  1 drop Both Eyes QHS  . letrozole  2.5 mg Oral Daily  . levETIRAcetam  500 mg Oral Daily  . levothyroxine  112 mcg Oral Q0600  . metoprolol succinate  50 mg Oral Daily  . multivitamin  1 tablet Oral QHS   Continuous Infusions: . cefTRIAXone (ROCEPHIN)  IV 2 g (04/18/21 0451)     Anti-infectives (From admission, onward)   Start     Dose/Rate Route Frequency Ordered Stop   04/18/21 1200  vancomycin (VANCOREADY) IVPB 750 mg/150 mL  Status:  Discontinued        750 mg 150 mL/hr over  60 Minutes Intravenous Every T-Th-Sa (Hemodialysis) 04/17/21 1436 04/18/21 0045   04/18/21 0500  cefTRIAXone (ROCEPHIN) 2 g in sodium chloride 0.9 % 100 mL IVPB        2 g 200 mL/hr over 30 Minutes Intravenous Every 24 hours 04/18/21 0047     04/17/21 1600  vancomycin (VANCOREADY) IVPB 1500 mg/300 mL        1,500 mg 150 mL/hr over 120 Minutes Intravenous  Once 04/17/21 1433 04/17/21 1832   04/17/21 1600  meropenem (MERREM) 500 mg in sodium chloride 0.9 % 100 mL IVPB  Status:  Discontinued        500 mg 200 mL/hr over 30 Minutes Intravenous Daily-1800 04/17/21 1505 04/18/21 0044   04/17/21 1500  cefTRIAXone (ROCEPHIN) 2 g in sodium chloride 0.9 % 100 mL IVPB  Status:  Discontinued       Note to Pharmacy: Antibiotics to be given after urine has been collected for urinalysis   2 g 200 mL/hr over 30 Minutes Intravenous Every 24 hours 04/17/21 1416 04/17/21 1500   04/11/21 0600  ceFAZolin (ANCEF) 1 g in sodium chloride 0.9 % 100 mL IVPB       Note to Pharmacy: To be given in specials   1 g 200 mL/hr over 30 Minutes Intravenous On call to O.R. 04/10/21 1432 04/10/21 1546             Family Communication/Anticipated D/C date and plan/Code Status   DVT prophylaxis: heparin injection 5,000 Units Start: 04/09/21 2200     Code Status: Full Code  Family Communication:  Discussed plan with son Disposition Plan:    Status is: Inpatient  Remains inpatient appropriate because:IV treatments appropriate due to intensity of illness or inability to take PO and Inpatient level of care appropriate due to severity of illness   Dispo: The patient is from: Home              Anticipated d/c is to: Home              Patient currently is not medically stable to d/c.   Difficult to place patient No           Subjective:   Interval events noted.  She had fever yesterday afternoon.  No fever overnight.  Objective:    Vitals:   04/18/21 0000 04/18/21 0418 04/18/21 0500 04/18/21 0935   BP:  115/62  133/71  Pulse: (!) 106 (!) 106  95  Resp:  18  16  Temp: 99.7 F (37.6 C) 98.8 F (37.1 C)  98.2 F (36.8 C)  TempSrc: Oral     SpO2:  95%  100%  Weight:   60.7 kg   Height:       No data found.   Intake/Output Summary (Last 24 hours) at 04/18/2021 1218  Last data filed at 04/17/2021 1826 Gross per 24 hour  Intake 492.41 ml  Output 50 ml  Net 442.41 ml   Filed Weights   04/15/21 0500 04/16/21 0625 04/18/21 0500  Weight: 59.9 kg 62 kg 60.7 kg    Exam:   GEN: NAD SKIN: Warm and dry.  Right IJ permacath in place.  No swelling, erythema or tenderness at catheter entry site. EYES: EOMI ENT: MMM CV: RRR PULM: CTA B ABD: soft, obese, NT, +BS CNS: AAO x 2 (person and place).  She knows she is in the hospital but she thinks she's at [facility]. EXT: No tenderness or tenderness        Data Reviewed:   I have personally reviewed following labs and imaging studies:  Labs: Labs show the following:   Basic Metabolic Panel: Recent Labs  Lab 04/13/21 1150 04/15/21 1007 04/16/21 1114 04/16/21 1210 04/17/21 0415 04/18/21 0408  NA 138 137  --  137 138 138  K 2.9* 3.4*  --  3.1* 3.5 3.6  CL 99 100  --  96* 100 100  CO2 27 27  --  27 26 28   GLUCOSE 101* 125*  --  119* 123* 185*  BUN 13 11  --  <5* 12 28*  CREATININE 2.02* 3.00*  --  1.17* 3.08* 4.89*  CALCIUM 8.3* 7.9*  --  8.6* 8.3* 8.1*  MG  --  1.8 1.8  --   --  1.9  PHOS 2.2*  --   --  1.4* 3.9 3.2   GFR Estimated Creatinine Clearance: 8.4 mL/min (A) (by C-G formula based on SCr of 4.89 mg/dL (H)). Liver Function Tests: Recent Labs  Lab 04/13/21 1150 04/16/21 1210  ALBUMIN 3.2* 3.2*   No results for input(s): LIPASE, AMYLASE in the last 168 hours. No results for input(s): AMMONIA in the last 168 hours. Coagulation profile No results for input(s): INR, PROTIME in the last 168 hours.  CBC: Recent Labs  Lab 04/13/21 1150 04/15/21 1007 04/16/21 1212 04/17/21 0415  04/18/21 0408  WBC 15.1* 11.2* 18.2* 29.3* 17.2*  NEUTROABS  --  8.5*  --  26.6* 15.3*  HGB 9.1* 9.1* 9.6* 8.5* 7.9*  HCT 27.1* 27.0* 29.4* 26.7* 24.7*  MCV 85.8 88.2 88.3 92.1 92.5  PLT 202 206 257 226 209   Cardiac Enzymes: No results for input(s): CKTOTAL, CKMB, CKMBINDEX, TROPONINI in the last 168 hours. BNP (last 3 results) No results for input(s): PROBNP in the last 8760 hours. CBG: Recent Labs  Lab 04/13/21 1615 04/13/21 2058 04/14/21 0724 04/14/21 1124 04/14/21 2144  GLUCAP 93 127* 82 126* 95   D-Dimer: No results for input(s): DDIMER in the last 72 hours. Hgb A1c: No results for input(s): HGBA1C in the last 72 hours. Lipid Profile: No results for input(s): CHOL, HDL, LDLCALC, TRIG, CHOLHDL, LDLDIRECT in the last 72 hours. Thyroid function studies: No results for input(s): TSH, T4TOTAL, T3FREE, THYROIDAB in the last 72 hours.  Invalid input(s): FREET3 Anemia work up: No results for input(s): VITAMINB12, FOLATE, FERRITIN, TIBC, IRON, RETICCTPCT in the last 72 hours. Sepsis Labs: Recent Labs  Lab 04/15/21 1007 04/16/21 1212 04/17/21 0415 04/17/21 1158 04/17/21 1354 04/18/21 0408  PROCALCITON  --   --   --  1.88  --  5.20  WBC 11.2* 18.2* 29.3*  --   --  17.2*  LATICACIDVEN  --   --   --  1.3 1.2  --     Microbiology Recent Results (from  the past 240 hour(s))  Resp Panel by RT-PCR (Flu A&B, Covid) Nasopharyngeal Swab     Status: None   Collection Time: 04/09/21  5:58 PM   Specimen: Nasopharyngeal Swab; Nasopharyngeal(NP) swabs in vial transport medium  Result Value Ref Range Status   SARS Coronavirus 2 by RT PCR NEGATIVE NEGATIVE Final    Comment: (NOTE) SARS-CoV-2 target nucleic acids are NOT DETECTED.  The SARS-CoV-2 RNA is generally detectable in upper respiratory specimens during the acute phase of infection. The lowest concentration of SARS-CoV-2 viral copies this assay can detect is 138 copies/mL. A negative result does not preclude  SARS-Cov-2 infection and should not be used as the sole basis for treatment or other patient management decisions. A negative result may occur with  improper specimen collection/handling, submission of specimen other than nasopharyngeal swab, presence of viral mutation(s) within the areas targeted by this assay, and inadequate number of viral copies(<138 copies/mL). A negative result must be combined with clinical observations, patient history, and epidemiological information. The expected result is Negative.  Fact Sheet for Patients:  EntrepreneurPulse.com.au  Fact Sheet for Healthcare Providers:  IncredibleEmployment.be  This test is no t yet approved or cleared by the [place] FDA and  has been authorized for detection and/or diagnosis of SARS-CoV-2 by FDA under an Emergency Use Authorization (EUA). This EUA will remain  in effect (meaning this test can be used) for the duration of the COVID-19 declaration under Section 564(b)(1) of the Act, 21 U.S.C.section 360bbb-3(b)(1), unless the authorization is terminated  or revoked sooner.       Influenza A by PCR NEGATIVE NEGATIVE Final   Influenza B by PCR NEGATIVE NEGATIVE Final    Comment: (NOTE) The Xpert Xpress SARS-CoV-2/FLU/RSV plus assay is intended as an aid in the diagnosis of influenza from Nasopharyngeal swab specimens and should not be used as a sole basis for treatment. Nasal washings and aspirates are unacceptable for Xpert Xpress SARS-CoV-2/FLU/RSV testing.  Fact Sheet for Patients: EntrepreneurPulse.com.au  Fact Sheet for Healthcare Providers: IncredibleEmployment.be  This test is not yet approved or cleared by the [place] FDA and has been authorized for detection and/or diagnosis of SARS-CoV-2 by FDA under an Emergency Use Authorization (EUA). This EUA will remain in effect (meaning this test can be used) for the duration of  the COVID-19 declaration under Section 564(b)(1) of the Act, 21 U.S.C. section 360bbb-3(b)(1), unless the authorization is terminated or revoked.  Performed at [facility], [address] [place], [place] [address]   CULTURE, BLOOD (ROUTINE X 2) w Reflex to ID Panel     Status: None (Preliminary result)   Collection Time: 04/17/21 11:58 AM   Specimen: BLOOD  Result Value Ref Range Status   Specimen Description BLOOD BLOOD RIGHT ARM  Final   Special Requests   Final    BOTTLES DRAWN AEROBIC AND ANAEROBIC Blood Culture adequate volume   Culture  Setup Time   Final    GRAM POSITIVE COCCI IN BOTH AEROBIC AND ANAEROBIC BOTTLES CRITICAL RESULT CALLED TO, READ BACK BY AND VERIFIED WITH: JASON ROBBINS ON 04/18/21 AT 0035 QSD Performed at [facility], [address] [place], [place] [address]    Culture GRAM POSITIVE COCCI  Final   Report Status PENDING  Incomplete  CULTURE, BLOOD (ROUTINE X 2) w Reflex to ID Panel     Status: None (Preliminary result)   Collection Time: 04/17/21 11:58 AM   Specimen: BLOOD  Result Value Ref Range Status   Specimen  Description   Final    BLOOD BLOOD RIGHT HAND Performed at [facility], [address] [place], [place] [address]    Special Requests   Final    BOTTLES DRAWN AEROBIC AND ANAEROBIC Blood Culture adequate volume Performed at [facility], [address] [place], [place] [address]    Culture  Setup Time   Final    GRAM POSITIVE COCCI IN BOTH AEROBIC AND ANAEROBIC BOTTLES CRITICAL RESULT CALLED TO, READ BACK BY AND VERIFIED WITH: JASON ROBBINS ON 04/18/21 AT 0035 QSD Performed at [facility], [place] [address] [place], [place] [address]    Culture GRAM POSITIVE COCCI  Final   Report Status PENDING  Incomplete  Blood Culture ID Panel (Reflexed)     Status: Abnormal   Collection Time: 04/17/21 11:58 AM  Result Value Ref Range Status   Enterococcus faecalis NOT DETECTED NOT DETECTED Final    Enterococcus Faecium NOT DETECTED NOT DETECTED Final   Listeria monocytogenes NOT DETECTED NOT DETECTED Final   Staphylococcus species NOT DETECTED NOT DETECTED Final   Staphylococcus aureus (BCID) NOT DETECTED NOT DETECTED Final   Staphylococcus epidermidis NOT DETECTED NOT DETECTED Final   Staphylococcus lugdunensis NOT DETECTED NOT DETECTED Final   Streptococcus species DETECTED (A) NOT DETECTED Final    Comment: Not Enterococcus species, Streptococcus agalactiae, Streptococcus pyogenes, or Streptococcus pneumoniae. CRITICAL RESULT CALLED TO, READ BACK BY AND VERIFIED WITH: JASON ROBBINS ON 04/18/21 AT 0035 QSD    Streptococcus agalactiae NOT DETECTED NOT DETECTED Final   Streptococcus pneumoniae NOT DETECTED NOT DETECTED Final   Streptococcus pyogenes NOT DETECTED NOT DETECTED Final   A.calcoaceticus-baumannii NOT DETECTED NOT DETECTED Final   Bacteroides fragilis NOT DETECTED NOT DETECTED Final   Enterobacterales NOT DETECTED NOT DETECTED Final   Enterobacter cloacae complex NOT DETECTED NOT DETECTED Final   Escherichia coli NOT DETECTED NOT DETECTED Final   Klebsiella aerogenes NOT DETECTED NOT DETECTED Final   Klebsiella oxytoca NOT DETECTED NOT DETECTED Final   Klebsiella pneumoniae NOT DETECTED NOT DETECTED Final   Proteus species NOT DETECTED NOT DETECTED Final   Salmonella species NOT DETECTED NOT DETECTED Final   Serratia marcescens NOT DETECTED NOT DETECTED Final   Haemophilus influenzae NOT DETECTED NOT DETECTED Final   Neisseria meningitidis NOT DETECTED NOT DETECTED Final   Pseudomonas aeruginosa NOT DETECTED NOT DETECTED Final   Stenotrophomonas maltophilia NOT DETECTED NOT DETECTED Final   Candida albicans NOT DETECTED NOT DETECTED Final   Candida auris NOT DETECTED NOT DETECTED Final   Candida glabrata NOT DETECTED NOT DETECTED Final   Candida krusei NOT DETECTED NOT DETECTED Final   Candida parapsilosis NOT DETECTED NOT DETECTED Final   Candida tropicalis NOT  DETECTED NOT DETECTED Final   Cryptococcus neoformans/gattii NOT DETECTED NOT DETECTED Final    Comment: Performed at [facility], [address] [place], [place] [address]  MRSA PCR Screening     Status: None   Collection Time: 04/17/21  3:46 PM   Specimen: Nasal Mucosa; Nasopharyngeal  Result Value Ref Range Status   MRSA by PCR NEGATIVE NEGATIVE Final    Comment:        The GeneXpert MRSA Assay (FDA approved for NASAL specimens only), is one component of a comprehensive MRSA colonization surveillance program. It is not intended to diagnose MRSA infection nor to guide or monitor treatment for MRSA infections. Performed at [facility], [address], [place], [place] [address]     Procedures and diagnostic  studies:  DG Chest Port 1 View  Result Date: 04/17/2021 CLINICAL DATA:  Fever and leukocytosis. EXAM: PORTABLE CHEST 1 VIEW COMPARISON:  Chest x-ray dated Apr 09, 2021. FINDINGS: Patient is rotated to the right. New tunneled right internal jugular dialysis catheter with distal lumen tip in the right atrium. Unchanged cardiomegaly. Normal pulmonary vascularity. No focal consolidation, pleural effusion, or pneumothorax. No acute osseous abnormality. Old healed displaced fracture of the left humeral diaphysis. IMPRESSION: 1. No active disease. Electronically Signed   By: Titus Dubin M.D.   On: 04/17/2021 16:00               LOS: 9 days   Tyreck Bell  [facility]   Pager on [email or web address]. If 7PM-7AM, please contact night-coverage at [email or web address]     04/18/2021, 12:18 PM

## 2021-04-18 NOTE — Progress Notes (Signed)
[name]  ROUNDING NOTE   Subjective:   Patient seen laying in bed Alert Complaining of stomach pain Breakfast at bedside, untouched Denies nausea and diarrhea Currently on room air, denies shortness of breath Patient seen later sleeping  UOP 36ml  Objective:  Vital signs in last 24 hours:  Temp:  [98.2 F (36.8 C)-103.1 F (39.5 C)] 98.2 F (36.8 C) (05/14 0935) Pulse Rate:  [95-111] 95 (05/14 0935) Resp:  [16-20] 16 (05/14 0935) BP: (115-178)/(53-92) 133/71 (05/14 0935) SpO2:  [95 %-100 %] 100 % (05/14 0935) Weight:  [60.7 kg] 60.7 kg (05/14 0500)  Weight change:  Filed Weights   04/15/21 0500 04/16/21 0625 04/18/21 0500  Weight: 59.9 kg 62 kg 60.7 kg    Intake/Output: I/O last 3 completed shifts: In: 532.4 [P.O.:160; IV Piggyback:372.4] Out: 50 [Urine:50]   Intake/Output this shift:  No intake/output data recorded.  Physical Exam: General: NAD, resting in bed  Head: Normocephalic, atraumatic. Moist oral mucosal membranes  Eyes: Anicteric  Lungs:  Diminished in bases  Heart: Regular rhythm and rate  Abdomen:  Soft, nontender,  distended  Extremities:  no peripheral edema.  Neurologic: Alert and oriented to self, place  Skin: No lesions  Access: RIJ permcath 5/6 Dr. Lucky Cowboy    Basic Metabolic Panel: Recent Labs  Lab 04/13/21 1150 04/15/21 1007 04/16/21 1114 04/16/21 1210 04/17/21 0415 04/18/21 0408  NA 138 137  --  137 138 138  K 2.9* 3.4*  --  3.1* 3.5 3.6  CL 99 100  --  96* 100 100  CO2 27 27  --  27 26 28   GLUCOSE 101* 125*  --  119* 123* 185*  BUN 13 11  --  <5* 12 28*  CREATININE 2.02* 3.00*  --  1.17* 3.08* 4.89*  CALCIUM 8.3* 7.9*  --  8.6* 8.3* 8.1*  MG  --  1.8 1.8  --   --  1.9  PHOS 2.2*  --   --  1.4* 3.9 3.2    Liver Function Tests: Recent Labs  Lab 04/13/21 1150 04/16/21 1210  ALBUMIN 3.2* 3.2*   No results for input(s): LIPASE, AMYLASE in the last 168 hours. No results for input(s): AMMONIA in the last  168 hours.  CBC: Recent Labs  Lab 04/13/21 1150 04/15/21 1007 04/16/21 1212 04/17/21 0415 04/18/21 0408  WBC 15.1* 11.2* 18.2* 29.3* 17.2*  NEUTROABS  --  8.5*  --  26.6* 15.3*  HGB 9.1* 9.1* 9.6* 8.5* 7.9*  HCT 27.1* 27.0* 29.4* 26.7* 24.7*  MCV 85.8 88.2 88.3 92.1 92.5  PLT 202 206 257 226 209    Cardiac Enzymes: No results for input(s): CKTOTAL, CKMB, CKMBINDEX, TROPONINI in the last 168 hours.  BNP: Invalid input(s): POCBNP  CBG: Recent Labs  Lab 04/13/21 1615 04/13/21 2058 04/14/21 0724 04/14/21 1124 04/14/21 2144  GLUCAP 93 127* 82 126* 95    Microbiology: Results for orders placed or performed during the hospital encounter of 04/09/21  Resp Panel by RT-PCR (Flu A&B, Covid) Nasopharyngeal Swab     Status: None   Collection Time: 04/09/21  5:58 PM   Specimen: Nasopharyngeal Swab; Nasopharyngeal(NP) swabs in vial transport medium  Result Value Ref Range Status   SARS Coronavirus 2 by RT PCR NEGATIVE NEGATIVE Final    Comment: (NOTE) SARS-CoV-2 target nucleic acids are NOT DETECTED.  The SARS-CoV-2 RNA is generally detectable in upper respiratory specimens during the acute phase of infection. The lowest concentration of SARS-CoV-2 viral copies this assay  can detect is 138 copies/mL. A negative result does not preclude SARS-Cov-2 infection and should not be used as the sole basis for treatment or other patient management decisions. A negative result may occur with  improper specimen collection/handling, submission of specimen other than nasopharyngeal swab, presence of viral mutation(s) within the areas targeted by this assay, and inadequate number of viral copies(<138 copies/mL). A negative result must be combined with clinical observations, patient history, and epidemiological information. The expected result is Negative.  Fact Sheet for Patients:  EntrepreneurPulse.com.au  Fact Sheet for Healthcare Providers:   IncredibleEmployment.be  This test is no t yet approved or cleared by the [place] FDA and  has been authorized for detection and/or diagnosis of SARS-CoV-2 by FDA under an Emergency Use Authorization (EUA). This EUA will remain  in effect (meaning this test can be used) for the duration of the COVID-19 declaration under Section 564(b)(1) of the Act, 21 U.S.C.section 360bbb-3(b)(1), unless the authorization is terminated  or revoked sooner.       Influenza A by PCR NEGATIVE NEGATIVE Final   Influenza B by PCR NEGATIVE NEGATIVE Final    Comment: (NOTE) The Xpert Xpress SARS-CoV-2/FLU/RSV plus assay is intended as an aid in the diagnosis of influenza from Nasopharyngeal swab specimens and should not be used as a sole basis for treatment. Nasal washings and aspirates are unacceptable for Xpert Xpress SARS-CoV-2/FLU/RSV testing.  Fact Sheet for Patients: EntrepreneurPulse.com.au  Fact Sheet for Healthcare Providers: IncredibleEmployment.be  This test is not yet approved or cleared by the [place] FDA and has been authorized for detection and/or diagnosis of SARS-CoV-2 by FDA under an Emergency Use Authorization (EUA). This EUA will remain in effect (meaning this test can be used) for the duration of the COVID-19 declaration under Section 564(b)(1) of the Act, 21 U.S.C. section 360bbb-3(b)(1), unless the authorization is terminated or revoked.  Performed at [facility], [address] [place], [place] [address]   CULTURE, BLOOD (ROUTINE X 2) w Reflex to ID Panel     Status: None (Preliminary result)   Collection Time: 04/17/21 11:58 AM   Specimen: BLOOD  Result Value Ref Range Status   Specimen Description BLOOD BLOOD RIGHT ARM  Final   Special Requests   Final    BOTTLES DRAWN AEROBIC AND ANAEROBIC Blood Culture adequate volume   Culture  Setup Time   Final    GRAM POSITIVE COCCI IN BOTH AEROBIC  AND ANAEROBIC BOTTLES CRITICAL RESULT CALLED TO, READ BACK BY AND VERIFIED WITH: JASON ROBBINS ON 04/18/21 AT 0035 QSD Performed at [facility], [address] [place], [place] [address]    Culture GRAM POSITIVE COCCI  Final   Report Status PENDING  Incomplete  CULTURE, BLOOD (ROUTINE X 2) w Reflex to ID Panel     Status: None (Preliminary result)   Collection Time: 04/17/21 11:58 AM   Specimen: BLOOD  Result Value Ref Range Status   Specimen Description   Final    BLOOD BLOOD RIGHT HAND Performed at [facility], [address], [place], [place] [address]    Special Requests   Final    BOTTLES DRAWN AEROBIC AND ANAEROBIC Blood Culture adequate volume Performed at [facility], [address] [place], [place] [address]    Culture  Setup Time   Final    GRAM POSITIVE COCCI IN BOTH AEROBIC AND ANAEROBIC BOTTLES CRITICAL RESULT CALLED TO, READ BACK BY AND VERIFIED WITH: JASON ROBBINS ON 04/18/21 AT 0035 QSD  Performed at [facility], [address], [place], [place] [address]    Culture GRAM POSITIVE COCCI  Final   Report Status PENDING  Incomplete  Blood Culture ID Panel (Reflexed)     Status: Abnormal   Collection Time: 04/17/21 11:58 AM  Result Value Ref Range Status   Enterococcus faecalis NOT DETECTED NOT DETECTED Final   Enterococcus Faecium NOT DETECTED NOT DETECTED Final   Listeria monocytogenes NOT DETECTED NOT DETECTED Final   Staphylococcus species NOT DETECTED NOT DETECTED Final   Staphylococcus aureus (BCID) NOT DETECTED NOT DETECTED Final   Staphylococcus epidermidis NOT DETECTED NOT DETECTED Final   Staphylococcus lugdunensis NOT DETECTED NOT DETECTED Final   Streptococcus species DETECTED (A) NOT DETECTED Final    Comment: Not Enterococcus species, Streptococcus agalactiae, Streptococcus pyogenes, or Streptococcus pneumoniae. CRITICAL RESULT CALLED TO, READ BACK BY AND VERIFIED WITH: JASON ROBBINS ON 04/18/21 AT 0035 QSD     Streptococcus agalactiae NOT DETECTED NOT DETECTED Final   Streptococcus pneumoniae NOT DETECTED NOT DETECTED Final   Streptococcus pyogenes NOT DETECTED NOT DETECTED Final   A.calcoaceticus-baumannii NOT DETECTED NOT DETECTED Final   Bacteroides fragilis NOT DETECTED NOT DETECTED Final   Enterobacterales NOT DETECTED NOT DETECTED Final   Enterobacter cloacae complex NOT DETECTED NOT DETECTED Final   Escherichia coli NOT DETECTED NOT DETECTED Final   Klebsiella aerogenes NOT DETECTED NOT DETECTED Final   Klebsiella oxytoca NOT DETECTED NOT DETECTED Final   Klebsiella pneumoniae NOT DETECTED NOT DETECTED Final   Proteus species NOT DETECTED NOT DETECTED Final   Salmonella species NOT DETECTED NOT DETECTED Final   Serratia marcescens NOT DETECTED NOT DETECTED Final   Haemophilus influenzae NOT DETECTED NOT DETECTED Final   Neisseria meningitidis NOT DETECTED NOT DETECTED Final   Pseudomonas aeruginosa NOT DETECTED NOT DETECTED Final   Stenotrophomonas maltophilia NOT DETECTED NOT DETECTED Final   Candida albicans NOT DETECTED NOT DETECTED Final   Candida auris NOT DETECTED NOT DETECTED Final   Candida glabrata NOT DETECTED NOT DETECTED Final   Candida krusei NOT DETECTED NOT DETECTED Final   Candida parapsilosis NOT DETECTED NOT DETECTED Final   Candida tropicalis NOT DETECTED NOT DETECTED Final   Cryptococcus neoformans/gattii NOT DETECTED NOT DETECTED Final    Comment: Performed at [facility], [address] [place], [place] [address]  MRSA PCR Screening     Status: None   Collection Time: 04/17/21  3:46 PM   Specimen: Nasal Mucosa; Nasopharyngeal  Result Value Ref Range Status   MRSA by PCR NEGATIVE NEGATIVE Final    Comment:        The GeneXpert MRSA Assay (FDA approved for NASAL specimens only), is one component of a comprehensive MRSA colonization surveillance program. It is not intended to diagnose MRSA infection nor to guide or monitor treatment for MRSA  infections. Performed at [facility], [address] [place], [place] [address]     Coagulation Studies: No results for input(s): LABPROT, INR in the last 72 hours.  Urinalysis: Recent Labs    04/17/21 1702  COLORURINE YELLOW*  LABSPEC 1.015  PHURINE 7.0  GLUCOSEU 150*  HGBUR NEGATIVE  BILIRUBINUR NEGATIVE  KETONESUR 5*  PROTEINUR >=300*  NITRITE NEGATIVE  LEUKOCYTESUR NEGATIVE      Imaging: DG Chest Port 1 View  Result Date: 04/17/2021 CLINICAL DATA:  Fever and leukocytosis. EXAM: PORTABLE CHEST 1 VIEW COMPARISON:  Chest x-ray dated Apr 09, 2021. FINDINGS: Patient is rotated to the right. New tunneled right internal  jugular dialysis catheter with distal lumen tip in the right atrium. Unchanged cardiomegaly. Normal pulmonary vascularity. No focal consolidation, pleural effusion, or pneumothorax. No acute osseous abnormality. Old healed displaced fracture of the left humeral diaphysis. IMPRESSION: 1. No active disease. Electronically Signed   By: Titus Dubin M.D.   On: 04/17/2021 16:00     Medications:   . cefTRIAXone (ROCEPHIN)  IV 2 g (04/18/21 0451)  . levETIRAcetam 500 mg (04/17/21 2256)   . amLODipine  10 mg Oral Daily  . aspirin  300 mg Rectal Daily  . Chlorhexidine Gluconate Cloth  6 each Topical Q0600  . cholecalciferol  1,000 Units Oral Daily  . cloNIDine  0.3 mg Transdermal Weekly  . epoetin (EPOGEN/PROCRIT) injection  4,000 Units Intravenous Q T,Th,Sa-HD  . feeding supplement (NEPRO CARB STEADY)  237 mL Oral TID BM  . heparin  5,000 Units Subcutaneous Q8H  . latanoprost  1 drop Both Eyes QHS  . letrozole  2.5 mg Oral Daily  . levothyroxine  112 mcg Oral Q0600  . metoprolol succinate  50 mg Oral Daily  . multivitamin  1 tablet Oral QHS   acetaminophen **OR** acetaminophen, calcium carbonate, camphor-menthol **AND** hydrOXYzine, docusate sodium, hydrALAZINE, LORazepam, ondansetron **OR** ondansetron (ZOFRAN) IV, sorbitol  Assessment/ Plan:   Kirsten Mcmahon is a 75 y.o.  female  with past medical history of GERD, Hypertension, diabetes, hyperlipidemia, CVA and CKD stage 4. She presents to the ED with increased lower extremity edema. She is known to our team from previous admissions.   1. End Stage Renal Disease requiring new start hemodialysis:  First dialysis treatment 5/6.   -Scheduled to receive dialysis today - UF goal 2L - Next treatment scheduled for Monday to accommodate outpatient schedule - Outpatient planning: Dialysis liaison notified of outpatient center acceptance at [facility] MWF, 1115. All parties aware   2. Anemia of chronic kidney disease  Lab Results  Component Value Date   HGB 7.9 (L) 04/18/2021   EPO will begin with treatments   3. Secondary Hyperparathyroidism: with hypocalcemia   Lab Results  Component Value Date   PTH 161 (H) 01/18/2021   CALCIUM 8.1 (L) 04/18/2021   PHOS 3.2 04/18/2021  Phosphorus at target Poor oral intake  4.Diabetes mellitus type II with chronic kidney disease noninsulin dependent.  hemoglobin A1c is 5.7 on 01/18/21.  Stable at this time  5. Hypertension: Home regimen of clonidine, amlodipine, metoprolol continued Stable at this time   LOS: 9   5/14/20229:43 AM

## 2021-04-19 DIAGNOSIS — R7881 Bacteremia: Secondary | ICD-10-CM | POA: Diagnosis not present

## 2021-04-19 DIAGNOSIS — Z992 Dependence on renal dialysis: Secondary | ICD-10-CM | POA: Diagnosis not present

## 2021-04-19 DIAGNOSIS — A409 Streptococcal sepsis, unspecified: Secondary | ICD-10-CM | POA: Diagnosis not present

## 2021-04-19 DIAGNOSIS — N186 End stage renal disease: Secondary | ICD-10-CM | POA: Diagnosis not present

## 2021-04-19 LAB — CBC WITH DIFFERENTIAL/PLATELET
Abs Immature Granulocytes: 0.08 10*3/uL — ABNORMAL HIGH (ref 0.00–0.07)
Basophils Absolute: 0 10*3/uL (ref 0.0–0.1)
Basophils Relative: 0 %
Eosinophils Absolute: 0.1 10*3/uL (ref 0.0–0.5)
Eosinophils Relative: 1 %
HCT: 25.4 % — ABNORMAL LOW (ref 36.0–46.0)
Hemoglobin: 8 g/dL — ABNORMAL LOW (ref 12.0–15.0)
Immature Granulocytes: 1 %
Lymphocytes Relative: 5 %
Lymphs Abs: 0.7 10*3/uL (ref 0.7–4.0)
MCH: 29 pg (ref 26.0–34.0)
MCHC: 31.5 g/dL (ref 30.0–36.0)
MCV: 92 fL (ref 80.0–100.0)
Monocytes Absolute: 1.4 10*3/uL — ABNORMAL HIGH (ref 0.1–1.0)
Monocytes Relative: 10 %
Neutro Abs: 11.2 10*3/uL — ABNORMAL HIGH (ref 1.7–7.7)
Neutrophils Relative %: 83 %
Platelets: 230 10*3/uL (ref 150–400)
RBC: 2.76 MIL/uL — ABNORMAL LOW (ref 3.87–5.11)
RDW: 16.3 % — ABNORMAL HIGH (ref 11.5–15.5)
WBC: 13.6 10*3/uL — ABNORMAL HIGH (ref 4.0–10.5)
nRBC: 0 % (ref 0.0–0.2)

## 2021-04-19 LAB — BASIC METABOLIC PANEL
Anion gap: 12 (ref 5–15)
BUN: 24 mg/dL — ABNORMAL HIGH (ref 8–23)
CO2: 27 mmol/L (ref 22–32)
Calcium: 8.3 mg/dL — ABNORMAL LOW (ref 8.9–10.3)
Chloride: 100 mmol/L (ref 98–111)
Creatinine, Ser: 3.64 mg/dL — ABNORMAL HIGH (ref 0.44–1.00)
GFR, Estimated: 12 mL/min — ABNORMAL LOW (ref >60)
Glucose, Bld: 156 mg/dL — ABNORMAL HIGH (ref 70–99)
Potassium: 3.6 mmol/L (ref 3.5–5.1)
Sodium: 139 mmol/L (ref 135–145)

## 2021-04-19 LAB — PROCALCITONIN: Procalcitonin: 6.03 ng/mL

## 2021-04-19 MED ORDER — SODIUM CHLORIDE 0.9 % IV SOLN
INTRAVENOUS | Status: DC | PRN
  Administered 2021-04-19 – 2021-04-21 (×3): 250 mL via INTRAVENOUS

## 2021-04-19 NOTE — Progress Notes (Signed)
Son called wanted to know why his mother was not being discharged.  Patient stated that she was going home today so she called him and asked him to come get her.  Advised patient she needed to have dialysis tomorrow.  Report received that patient was going home after dialysis but on rounds stated that patient was not ready for discharge

## 2021-04-19 NOTE — Progress Notes (Signed)
[name]  ROUNDING NOTE   Subjective:   Patient seen laying in bed Alert and able to answer simple questions Breakfast tray at bedside Says her son will be picking her up at discharge Says she is ready to go home  Objective:  Vital signs in last 24 hours:  Temp:  [97.5 F (36.4 C)-100.4 F (38 C)] 98.8 F (37.1 C) (05/15 0736) Pulse Rate:  [70-116] 96 (05/15 0736) Resp:  [14-25] 16 (05/15 0736) BP: (101-144)/(61-85) 101/64 (05/15 0736) SpO2:  [97 %-100 %] 99 % (05/15 0736) Weight:  [57.8 kg] 57.8 kg (05/15 0500)  Weight change: -2.858 kg Filed Weights   04/16/21 0625 04/18/21 0500 04/19/21 0500  Weight: 62 kg 60.7 kg 57.8 kg    Intake/Output: I/O last 3 completed shifts: In: 647.2 [P.O.:440; I.V.:3.5; IV Piggyback:203.6] Out: 2000 [Other:2000]   Intake/Output this shift:  Total I/O In: 17.2 [I.V.:17.2] Out: -   Physical Exam: General: NAD, resting in bed  Head: Normocephalic, atraumatic. Moist oral mucosal membranes  Eyes: Anicteric  Lungs:  Diminished in bases  Heart: Regular rhythm and rate  Abdomen:  Soft, nontender,  distended  Extremities:  no peripheral edema.  Neurologic: Alert and oriented to self  Skin: No lesions  Access: RIJ permcath 5/6 Dr. Lucky Cowboy    Basic Metabolic Panel: Recent Labs  Lab 04/13/21 1150 04/15/21 1007 04/16/21 1114 04/16/21 1210 04/17/21 0415 04/18/21 0408 04/19/21 0400  NA 138 137  --  137 138 138 139  K 2.9* 3.4*  --  3.1* 3.5 3.6 3.6  CL 99 100  --  96* 100 100 100  CO2 27 27  --  27 26 28 27   GLUCOSE 101* 125*  --  119* 123* 185* 156*  BUN 13 11  --  <5* 12 28* 24*  CREATININE 2.02* 3.00*  --  1.17* 3.08* 4.89* 3.64*  CALCIUM 8.3* 7.9*  --  8.6* 8.3* 8.1* 8.3*  MG  --  1.8 1.8  --   --  1.9  --   PHOS 2.2*  --   --  1.4* 3.9 3.2  --     Liver Function Tests: Recent Labs  Lab 04/13/21 1150 04/16/21 1210  ALBUMIN 3.2* 3.2*   No results for input(s): LIPASE, AMYLASE in the last 168 hours. No  results for input(s): AMMONIA in the last 168 hours.  CBC: Recent Labs  Lab 04/15/21 1007 04/16/21 1212 04/17/21 0415 04/18/21 0408 04/19/21 0400  WBC 11.2* 18.2* 29.3* 17.2* 13.6*  NEUTROABS 8.5*  --  26.6* 15.3* 11.2*  HGB 9.1* 9.6* 8.5* 7.9* 8.0*  HCT 27.0* 29.4* 26.7* 24.7* 25.4*  MCV 88.2 88.3 92.1 92.5 92.0  PLT 206 257 226 209 230    Cardiac Enzymes: No results for input(s): CKTOTAL, CKMB, CKMBINDEX, TROPONINI in the last 168 hours.  BNP: Invalid input(s): POCBNP  CBG: Recent Labs  Lab 04/13/21 1615 04/13/21 2058 04/14/21 0724 04/14/21 1124 04/14/21 2144  GLUCAP 93 127* 82 126* 95    Microbiology: Results for orders placed or performed during the hospital encounter of 04/09/21  Resp Panel by RT-PCR (Flu A&B, Covid) Nasopharyngeal Swab     Status: None   Collection Time: 04/09/21  5:58 PM   Specimen: Nasopharyngeal Swab; Nasopharyngeal(NP) swabs in vial transport medium  Result Value Ref Range Status   SARS Coronavirus 2 by RT PCR NEGATIVE NEGATIVE Final    Comment: (NOTE) SARS-CoV-2 target nucleic acids are NOT DETECTED.  The SARS-CoV-2 RNA is generally detectable  in upper respiratory specimens during the acute phase of infection. The lowest concentration of SARS-CoV-2 viral copies this assay can detect is 138 copies/mL. A negative result does not preclude SARS-Cov-2 infection and should not be used as the sole basis for treatment or other patient management decisions. A negative result may occur with  improper specimen collection/handling, submission of specimen other than nasopharyngeal swab, presence of viral mutation(s) within the areas targeted by this assay, and inadequate number of viral copies(<138 copies/mL). A negative result must be combined with clinical observations, patient history, and epidemiological information. The expected result is Negative.  Fact Sheet for Patients:  EntrepreneurPulse.com.au  Fact Sheet for  Healthcare Providers:  IncredibleEmployment.be  This test is no t yet approved or cleared by the [place] FDA and  has been authorized for detection and/or diagnosis of SARS-CoV-2 by FDA under an Emergency Use Authorization (EUA). This EUA will remain  in effect (meaning this test can be used) for the duration of the COVID-19 declaration under Section 564(b)(1) of the Act, 21 U.S.C.section 360bbb-3(b)(1), unless the authorization is terminated  or revoked sooner.       Influenza A by PCR NEGATIVE NEGATIVE Final   Influenza B by PCR NEGATIVE NEGATIVE Final    Comment: (NOTE) The Xpert Xpress SARS-CoV-2/FLU/RSV plus assay is intended as an aid in the diagnosis of influenza from Nasopharyngeal swab specimens and should not be used as a sole basis for treatment. Nasal washings and aspirates are unacceptable for Xpert Xpress SARS-CoV-2/FLU/RSV testing.  Fact Sheet for Patients: EntrepreneurPulse.com.au  Fact Sheet for Healthcare Providers: IncredibleEmployment.be  This test is not yet approved or cleared by the [place] FDA and has been authorized for detection and/or diagnosis of SARS-CoV-2 by FDA under an Emergency Use Authorization (EUA). This EUA will remain in effect (meaning this test can be used) for the duration of the COVID-19 declaration under Section 564(b)(1) of the Act, 21 U.S.C. section 360bbb-3(b)(1), unless the authorization is terminated or revoked.  Performed at [facility], [address] [place], [place] [address]   CULTURE, BLOOD (ROUTINE X 2) w Reflex to ID Panel     Status: None (Preliminary result)   Collection Time: 04/17/21 11:58 AM   Specimen: BLOOD  Result Value Ref Range Status   Specimen Description BLOOD BLOOD RIGHT ARM  Final   Special Requests   Final    BOTTLES DRAWN AEROBIC AND ANAEROBIC Blood Culture adequate volume   Culture  Setup Time   Final    GRAM POSITIVE  COCCI IN BOTH AEROBIC AND ANAEROBIC BOTTLES CRITICAL RESULT CALLED TO, READ BACK BY AND VERIFIED WITH: JASON ROBBINS ON 04/18/21 AT 0035 QSD Performed at [facility], [address], [place], [place] [address]    Culture GRAM POSITIVE COCCI  Final   Report Status PENDING  Incomplete  CULTURE, BLOOD (ROUTINE X 2) w Reflex to ID Panel     Status: None (Preliminary result)   Collection Time: 04/17/21 11:58 AM   Specimen: BLOOD  Result Value Ref Range Status   Specimen Description   Final    BLOOD BLOOD RIGHT HAND Performed at [facility], [address], [place], [place] [address]    Special Requests   Final    BOTTLES DRAWN AEROBIC AND ANAEROBIC Blood Culture adequate volume Performed at [facility], [address] [place], [place] [address]    Culture  Setup Time   Final    GRAM POSITIVE COCCI IN BOTH AEROBIC AND  ANAEROBIC BOTTLES CRITICAL RESULT CALLED TO, READ BACK BY AND VERIFIED WITH: JASON ROBBINS ON 04/18/21 AT 8099 QSD Performed at [facility], [place] [address] [place], [place] [address]    Culture GRAM POSITIVE COCCI  Final   Report Status PENDING  Incomplete  Blood Culture ID Panel (Reflexed)     Status: Abnormal   Collection Time: 04/17/21 11:58 AM  Result Value Ref Range Status   Enterococcus faecalis NOT DETECTED NOT DETECTED Final   Enterococcus Faecium NOT DETECTED NOT DETECTED Final   Listeria monocytogenes NOT DETECTED NOT DETECTED Final   Staphylococcus species NOT DETECTED NOT DETECTED Final   Staphylococcus aureus (BCID) NOT DETECTED NOT DETECTED Final   Staphylococcus epidermidis NOT DETECTED NOT DETECTED Final   Staphylococcus lugdunensis NOT DETECTED NOT DETECTED Final   Streptococcus species DETECTED (A) NOT DETECTED Final    Comment: Not Enterococcus species, Streptococcus agalactiae, Streptococcus pyogenes, or Streptococcus pneumoniae. CRITICAL RESULT CALLED TO, READ BACK BY AND VERIFIED WITH: JASON ROBBINS ON  04/18/21 AT 0035 QSD    Streptococcus agalactiae NOT DETECTED NOT DETECTED Final   Streptococcus pneumoniae NOT DETECTED NOT DETECTED Final   Streptococcus pyogenes NOT DETECTED NOT DETECTED Final   A.calcoaceticus-baumannii NOT DETECTED NOT DETECTED Final   Bacteroides fragilis NOT DETECTED NOT DETECTED Final   Enterobacterales NOT DETECTED NOT DETECTED Final   Enterobacter cloacae complex NOT DETECTED NOT DETECTED Final   Escherichia coli NOT DETECTED NOT DETECTED Final   Klebsiella aerogenes NOT DETECTED NOT DETECTED Final   Klebsiella oxytoca NOT DETECTED NOT DETECTED Final   Klebsiella pneumoniae NOT DETECTED NOT DETECTED Final   Proteus species NOT DETECTED NOT DETECTED Final   Salmonella species NOT DETECTED NOT DETECTED Final   Serratia marcescens NOT DETECTED NOT DETECTED Final   Haemophilus influenzae NOT DETECTED NOT DETECTED Final   Neisseria meningitidis NOT DETECTED NOT DETECTED Final   Pseudomonas aeruginosa NOT DETECTED NOT DETECTED Final   Stenotrophomonas maltophilia NOT DETECTED NOT DETECTED Final   Candida albicans NOT DETECTED NOT DETECTED Final   Candida auris NOT DETECTED NOT DETECTED Final   Candida glabrata NOT DETECTED NOT DETECTED Final   Candida krusei NOT DETECTED NOT DETECTED Final   Candida parapsilosis NOT DETECTED NOT DETECTED Final   Candida tropicalis NOT DETECTED NOT DETECTED Final   Cryptococcus neoformans/gattii NOT DETECTED NOT DETECTED Final    Comment: Performed at [facility], [address] [place], [place] [address]  MRSA PCR Screening     Status: None   Collection Time: 04/17/21  3:46 PM   Specimen: Nasal Mucosa; Nasopharyngeal  Result Value Ref Range Status   MRSA by PCR NEGATIVE NEGATIVE Final    Comment:        The GeneXpert MRSA Assay (FDA approved for NASAL specimens only), is one component of a comprehensive MRSA colonization surveillance program. It is not intended to diagnose MRSA infection nor to guide  or monitor treatment for MRSA infections. Performed at [facility], [address] [place], [place] [address]     Coagulation Studies: No results for input(s): LABPROT, INR in the last 72 hours.  Urinalysis: Recent Labs    04/17/21 1702  COLORURINE YELLOW*  LABSPEC 1.015  PHURINE 7.0  GLUCOSEU 150*  HGBUR NEGATIVE  BILIRUBINUR NEGATIVE  KETONESUR 5*  PROTEINUR >=300*  NITRITE NEGATIVE  LEUKOCYTESUR NEGATIVE      Imaging: DG Chest Port 1 View  Result Date: 04/17/2021 CLINICAL DATA:  Fever and leukocytosis. EXAM: PORTABLE CHEST 1 VIEW  COMPARISON:  Chest x-ray dated Apr 09, 2021. FINDINGS: Patient is rotated to the right. New tunneled right internal jugular dialysis catheter with distal lumen tip in the right atrium. Unchanged cardiomegaly. Normal pulmonary vascularity. No focal consolidation, pleural effusion, or pneumothorax. No acute osseous abnormality. Old healed displaced fracture of the left humeral diaphysis. IMPRESSION: 1. No active disease. Electronically Signed   By: Titus Dubin M.D.   On: 04/17/2021 16:00     Medications:   . sodium chloride 10 mL/hr at 04/19/21 0557  . cefTRIAXone (ROCEPHIN)  IV Stopped (04/19/21 0557)   . amLODipine  10 mg Oral Daily  . aspirin  300 mg Rectal Daily  . Chlorhexidine Gluconate Cloth  6 each Topical Q0600  . cholecalciferol  1,000 Units Oral Daily  . cloNIDine  0.3 mg Transdermal Weekly  . epoetin (EPOGEN/PROCRIT) injection  4,000 Units Intravenous Q T,Th,Sa-HD  . feeding supplement (NEPRO CARB STEADY)  237 mL Oral TID BM  . heparin  5,000 Units Subcutaneous Q8H  . latanoprost  1 drop Both Eyes QHS  . letrozole  2.5 mg Oral Daily  . levETIRAcetam  500 mg Oral Daily  . levothyroxine  112 mcg Oral Q0600  . metoprolol succinate  50 mg Oral Daily  . multivitamin  1 tablet Oral QHS   sodium chloride, acetaminophen **OR** acetaminophen, calcium carbonate, camphor-menthol **AND** hydrOXYzine, docusate sodium,  hydrALAZINE, LORazepam, ondansetron **OR** ondansetron (ZOFRAN) IV, sorbitol  Assessment/ Plan:  [name] is a 75 y.o.  female  with past medical history of GERD, Hypertension, diabetes, hyperlipidemia, CVA and CKD stage 4. She presents to the ED with increased lower extremity edema. She is known to our team from previous admissions.   1. End Stage Renal Disease requiring new start hemodialysis:  First dialysis treatment 5/6.   -Received dialysis yesterday - UF goal 2L acheived - Next treatment scheduled for Monday to accommodate outpatient schedule - Outpatient planning: Dialysis liaison notified of outpatient center acceptance at [facility], MWF, 1115. All parties aware - Discussed discharge plan with patient, she agreeable to continue HD.   2. Anemia of chronic kidney disease  Lab Results  Component Value Date   HGB 8.0 (L) 04/19/2021   EPO will begin with treatments   3. Secondary Hyperparathyroidism: with hypocalcemia   Lab Results  Component Value Date   PTH 161 (H) 01/18/2021   CALCIUM 8.3 (L) 04/19/2021   PHOS 3.2 04/18/2021  Phosphorus at target Oral intake improving  4.Diabetes mellitus type II with chronic kidney disease noninsulin dependent.  hemoglobin A1c 5.7 on 01/18/21.  Stable   5. Hypertension: Home regimen of clonidine, amlodipine, metoprolol continued Stable at this time   LOS: 10   5/15/20229:04 AM

## 2021-04-19 NOTE — Progress Notes (Signed)
Patient returned to bed after sitting up for 4.5 hours.  Very alert however she was incontinent of bowel

## 2021-04-19 NOTE — Progress Notes (Signed)
Progress Note    Kirsten Mcmahon  GNF:[id number] DOB: 1946/09/07  DOA: 04/09/2021 PCP: [place]      Brief Narrative:    Medical records reviewed and are as summarized below:  Kirsten Mcmahon is a 75 y.o. female with medical history significant for CKD stage V, hypertension, hypothyroidism, diabetes mellitus, hyperlipidemia, history of stroke, seizure disorder, overactive bladder.  She presented to the hospital because of increased swelling of the lower extremities despite taking diuretics.  In the emergency room, she was noted to have a creatinine of 7.99 which was much higher than her previous creatinine of 3.99 (2 months prior).  She was admitted to the hospital for AKI on CKD stage V.  She was evaluated by the nephrologist.  Vascular surgeon was consulted and a right IJ tunneled hemodialysis permacath was placed on 04/10/2021.  Hemodialysis was initiated on 04/10/2021.      Assessment/Plan:   Principal Problem:   Acute renal failure superimposed on stage 3b chronic kidney disease (HCC) Active Problems:   Hypertension   Hypothyroidism   GERD (gastroesophageal reflux disease)   Anemia in chronic kidney disease (CKD)   Type II diabetes mellitus with renal manifestations ([place])   ESRD needing dialysis ([place])   Leukocytosis   Fever   Streptococcal sepsis, unspecified (HCC)   Streptococcal bacteremia   Body mass index is 23.32 kg/m.     ESRD: Hemodialysis was initiated on 04/10/2021.  She will continue hemodialysis as an outpatient.  She has an outpatient hemodialysis chair at [facility] on Mondays, Wednesdays and Fridays.   Streptococcal sepsis and bacteremia: Leukocytosis is improving.  Continue IV ceftriaxone.  Obtain 2D echo to check for endocarditis.  Repeat blood culture tomorrow.  Consult ID tomorrow to assist with management.  Confusion, suspected metabolic encephalopathy/delirium, underlying cognitive impairment: According to her son,  patient has had chronic confusion since her fall in February 2022.  He said she has never been the same since her fall.  He said she used to be able to "work the phone" but since that fall, she has not been able to use a phone to make a call.     Hypokalemia and hypophosphatemia: Improved  Left lower extremity edema: No evidence of DVT.  Anemia of chronic disease: S/p transfusion with 1 unit of PRBCs on 04/11/2021.  Seizure disorder: Continue Keppra  Hypertension: Continue clonidine patch and other antihypertensives as able  Type II DM: NovoLog as needed for hyperglycemia.  CAD, history of stroke with left-sided weakness: Continue aspirin   Diet Order            DIET - DYS 1 Room service appropriate? Yes; Fluid consistency: Thin  Diet effective now                    Consultants:  Vascular surgeon  Nephrologist  Procedures:  Right IJ permacath placed on 04/10/2021    Medications:   . amLODipine  10 mg Oral Daily  . aspirin  300 mg Rectal Daily  . Chlorhexidine Gluconate Cloth  6 each Topical Q0600  . cholecalciferol  1,000 Units Oral Daily  . cloNIDine  0.3 mg Transdermal Weekly  . epoetin (EPOGEN/PROCRIT) injection  4,000 Units Intravenous Q T,Th,Sa-HD  . feeding supplement (NEPRO CARB STEADY)  237 mL Oral TID BM  . heparin  5,000 Units Subcutaneous Q8H  . latanoprost  1 drop Both Eyes QHS  . letrozole  2.5 mg Oral Daily  .  levETIRAcetam  500 mg Oral Daily  . levothyroxine  112 mcg Oral Q0600  . metoprolol succinate  50 mg Oral Daily  . multivitamin  1 tablet Oral QHS   Continuous Infusions: . sodium chloride 10 mL/hr at 04/19/21 0557  . cefTRIAXone (ROCEPHIN)  IV Stopped (04/19/21 0557)     Anti-infectives (From admission, onward)   Start     Dose/Rate Route Frequency Ordered Stop   04/18/21 1200  vancomycin (VANCOREADY) IVPB 750 mg/150 mL  Status:  Discontinued        750 mg 150 mL/hr over 60 Minutes Intravenous Every T-Th-Sa (Hemodialysis) 04/17/21  1436 04/18/21 0045   04/18/21 0500  cefTRIAXone (ROCEPHIN) 2 g in sodium chloride 0.9 % 100 mL IVPB        2 g 200 mL/hr over 30 Minutes Intravenous Every 24 hours 04/18/21 0047     04/17/21 1600  vancomycin (VANCOREADY) IVPB 1500 mg/300 mL        1,500 mg 150 mL/hr over 120 Minutes Intravenous  Once 04/17/21 1433 04/17/21 1832   04/17/21 1600  meropenem (MERREM) 500 mg in sodium chloride 0.9 % 100 mL IVPB  Status:  Discontinued        500 mg 200 mL/hr over 30 Minutes Intravenous Daily-1800 04/17/21 1505 04/18/21 0044   04/17/21 1500  cefTRIAXone (ROCEPHIN) 2 g in sodium chloride 0.9 % 100 mL IVPB  Status:  Discontinued       Note to Pharmacy: Antibiotics to be given after urine has been collected for urinalysis   2 g 200 mL/hr over 30 Minutes Intravenous Every 24 hours 04/17/21 1416 04/17/21 1500   04/11/21 0600  ceFAZolin (ANCEF) 1 g in sodium chloride 0.9 % 100 mL IVPB       Note to Pharmacy: To be given in specials   1 g 200 mL/hr over 30 Minutes Intravenous On call to O.R. 04/10/21 1432 04/10/21 1546             Family Communication/Anticipated D/C date and plan/Code Status   DVT prophylaxis: heparin injection 5,000 Units Start: 04/09/21 2200     Code Status: Full Code  Family Communication:  Discussed plan with son Disposition Plan:    Status is: Inpatient  Remains inpatient appropriate because:IV treatments appropriate due to intensity of illness or inability to take PO and Inpatient level of care appropriate due to severity of illness   Dispo: The patient is from: Home              Anticipated d/c is to: Home              Patient currently is not medically stable to d/c.   Difficult to place patient No           Subjective:   Interval events noted.  She has no complaints.  No fever, cough, shortness of breath or chest pain.  Objective:    Vitals:   04/19/21 0600 04/19/21 0736 04/19/21 1106 04/19/21 1526  BP:  101/64 108/65 131/75  Pulse:  96  91 (!) 103  Resp:  16 16 18   Temp: 99.3 F (37.4 C) 98.8 F (37.1 C) (!) 97.5 F (36.4 C) 98.2 F (36.8 C)  TempSrc: Oral Oral Oral   SpO2:  99% 100% 100%  Weight:      Height:       No data found.   Intake/Output Summary (Last 24 hours) at 04/19/2021 1626 Last data filed at 04/19/2021 1018 Gross per 24 hour  Intake 784.32 ml  Output 2000 ml  Net -1215.68 ml   Filed Weights   04/16/21 0625 04/18/21 0500 04/19/21 0500  Weight: 62 kg 60.7 kg 57.8 kg    Exam:   GEN: No acute distress SKIN: Warm and dry.  Right IJ permacath in place.  EYES: No pallor or icterus ENT: MMM CV: Regular rate and rhythm PULM: No wheezing or rales. ABD: Soft, obese, nontender +BS CNS: Alert and oriented to person and place.  Left-sided weakness EXT: No edema or tenderness        Data Reviewed:   I have personally reviewed following labs and imaging studies:  Labs: Labs show the following:   Basic Metabolic Panel: Recent Labs  Lab 04/13/21 1150 04/15/21 1007 04/16/21 1114 04/16/21 1210 04/17/21 0415 04/18/21 0408 04/19/21 0400  NA 138 137  --  137 138 138 139  K 2.9* 3.4*  --  3.1* 3.5 3.6 3.6  CL 99 100  --  96* 100 100 100  CO2 27 27  --  27 26 28 27   GLUCOSE 101* 125*  --  119* 123* 185* 156*  BUN 13 11  --  <5* 12 28* 24*  CREATININE 2.02* 3.00*  --  1.17* 3.08* 4.89* 3.64*  CALCIUM 8.3* 7.9*  --  8.6* 8.3* 8.1* 8.3*  MG  --  1.8 1.8  --   --  1.9  --   PHOS 2.2*  --   --  1.4* 3.9 3.2  --    GFR Estimated Creatinine Clearance: 10.4 mL/min (A) (by C-G formula based on SCr of 3.64 mg/dL (H)). Liver Function Tests: Recent Labs  Lab 04/13/21 1150 04/16/21 1210  ALBUMIN 3.2* 3.2*   No results for input(s): LIPASE, AMYLASE in the last 168 hours. No results for input(s): AMMONIA in the last 168 hours. Coagulation profile No results for input(s): INR, PROTIME in the last 168 hours.  CBC: Recent Labs  Lab 04/15/21 1007 04/16/21 1212 04/17/21 0415  04/18/21 0408 04/19/21 0400  WBC 11.2* 18.2* 29.3* 17.2* 13.6*  NEUTROABS 8.5*  --  26.6* 15.3* 11.2*  HGB 9.1* 9.6* 8.5* 7.9* 8.0*  HCT 27.0* 29.4* 26.7* 24.7* 25.4*  MCV 88.2 88.3 92.1 92.5 92.0  PLT 206 257 226 209 230   Cardiac Enzymes: No results for input(s): CKTOTAL, CKMB, CKMBINDEX, TROPONINI in the last 168 hours. BNP (last 3 results) No results for input(s): PROBNP in the last 8760 hours. CBG: Recent Labs  Lab 04/13/21 1615 04/13/21 2058 04/14/21 0724 04/14/21 1124 04/14/21 2144  GLUCAP 93 127* 82 126* 95   D-Dimer: No results for input(s): DDIMER in the last 72 hours. Hgb A1c: No results for input(s): HGBA1C in the last 72 hours. Lipid Profile: No results for input(s): CHOL, HDL, LDLCALC, TRIG, CHOLHDL, LDLDIRECT in the last 72 hours. Thyroid function studies: No results for input(s): TSH, T4TOTAL, T3FREE, THYROIDAB in the last 72 hours.  Invalid input(s): FREET3 Anemia work up: No results for input(s): VITAMINB12, FOLATE, FERRITIN, TIBC, IRON, RETICCTPCT in the last 72 hours. Sepsis Labs: Recent Labs  Lab 04/16/21 1212 04/17/21 0415 04/17/21 1158 04/17/21 1354 04/18/21 0408 04/19/21 0400  PROCALCITON  --   --  1.88  --  5.20 6.03  WBC 18.2* 29.3*  --   --  17.2* 13.6*  LATICACIDVEN  --   --  1.3 1.2  --   --     Microbiology Recent Results (from the past 240 hour(s))  Resp Panel by RT-PCR (Flu  A&B, Covid) Nasopharyngeal Swab     Status: None   Collection Time: 04/09/21  5:58 PM   Specimen: Nasopharyngeal Swab; Nasopharyngeal(NP) swabs in vial transport medium  Result Value Ref Range Status   SARS Coronavirus 2 by RT PCR NEGATIVE NEGATIVE Final    Comment: (NOTE) SARS-CoV-2 target nucleic acids are NOT DETECTED.  The SARS-CoV-2 RNA is generally detectable in upper respiratory specimens during the acute phase of infection. The lowest concentration of SARS-CoV-2 viral copies this assay can detect is 138 copies/mL. A negative result does not  preclude SARS-Cov-2 infection and should not be used as the sole basis for treatment or other patient management decisions. A negative result may occur with  improper specimen collection/handling, submission of specimen other than nasopharyngeal swab, presence of viral mutation(s) within the areas targeted by this assay, and inadequate number of viral copies(<138 copies/mL). A negative result must be combined with clinical observations, patient history, and epidemiological information. The expected result is Negative.  Fact Sheet for Patients:  EntrepreneurPulse.com.au  Fact Sheet for Healthcare Providers:  IncredibleEmployment.be  This test is no t yet approved or cleared by the [place] FDA and  has been authorized for detection and/or diagnosis of SARS-CoV-2 by FDA under an Emergency Use Authorization (EUA). This EUA will remain  in effect (meaning this test can be used) for the duration of the COVID-19 declaration under Section 564(b)(1) of the Act, 21 U.S.C.section 360bbb-3(b)(1), unless the authorization is terminated  or revoked sooner.       Influenza A by PCR NEGATIVE NEGATIVE Final   Influenza B by PCR NEGATIVE NEGATIVE Final    Comment: (NOTE) The Xpert Xpress SARS-CoV-2/FLU/RSV plus assay is intended as an aid in the diagnosis of influenza from Nasopharyngeal swab specimens and should not be used as a sole basis for treatment. Nasal washings and aspirates are unacceptable for Xpert Xpress SARS-CoV-2/FLU/RSV testing.  Fact Sheet for Patients: EntrepreneurPulse.com.au  Fact Sheet for Healthcare Providers: IncredibleEmployment.be  This test is not yet approved or cleared by the [place] FDA and has been authorized for detection and/or diagnosis of SARS-CoV-2 by FDA under an Emergency Use Authorization (EUA). This EUA will remain in effect (meaning this test can be used) for the  duration of the COVID-19 declaration under Section 564(b)(1) of the Act, 21 U.S.C. section 360bbb-3(b)(1), unless the authorization is terminated or revoked.  Performed at [facility], [address] [place], [place] [address]   CULTURE, BLOOD (ROUTINE X 2) w Reflex to ID Panel     Status: Abnormal (Preliminary result)   Collection Time: 04/17/21 11:58 AM   Specimen: BLOOD  Result Value Ref Range Status   Specimen Description   Final    BLOOD BLOOD RIGHT ARM Performed at [facility], [address], [place], [place] [address]    Special Requests   Final    BOTTLES DRAWN AEROBIC AND ANAEROBIC Blood Culture adequate volume Performed at [facility], [address], [place], [place] [address]    Culture  Setup Time   Final    GRAM POSITIVE COCCI IN BOTH AEROBIC AND ANAEROBIC BOTTLES CRITICAL RESULT CALLED TO, READ BACK BY AND VERIFIED WITH: JASON ROBBINS ON 04/18/21 AT 0035 QSD Performed at [facility], [address], [place], [place] [address]    Culture STREPTOCOCCUS GROUP G (A)  Final   Report Status PENDING  Incomplete  CULTURE, BLOOD (ROUTINE X 2) w Reflex to ID Panel     Status: Abnormal (Preliminary  result)   Collection Time: 04/17/21 11:58 AM   Specimen: BLOOD  Result Value Ref Range Status   Specimen Description   Final    BLOOD BLOOD RIGHT HAND Performed at [facility], [address], [place], [place] [address]    Special Requests   Final    BOTTLES DRAWN AEROBIC AND ANAEROBIC Blood Culture adequate volume Performed at [facility], [address] [place], [place] [address]    Culture  Setup Time   Final    GRAM POSITIVE COCCI IN BOTH AEROBIC AND ANAEROBIC BOTTLES CRITICAL RESULT CALLED TO, READ BACK BY AND VERIFIED WITH: JASON ROBBINS ON 04/18/21 AT 0035 QSD    Culture (A)  Final    STREPTOCOCCUS GROUP G SUSCEPTIBILITIES TO FOLLOW Performed at [facility], [place] [address],  [place], [place] [address]    Report Status PENDING  Incomplete  Blood Culture ID Panel (Reflexed)     Status: Abnormal   Collection Time: 04/17/21 11:58 AM  Result Value Ref Range Status   Enterococcus faecalis NOT DETECTED NOT DETECTED Final   Enterococcus Faecium NOT DETECTED NOT DETECTED Final   Listeria monocytogenes NOT DETECTED NOT DETECTED Final   Staphylococcus species NOT DETECTED NOT DETECTED Final   Staphylococcus aureus (BCID) NOT DETECTED NOT DETECTED Final   Staphylococcus epidermidis NOT DETECTED NOT DETECTED Final   Staphylococcus lugdunensis NOT DETECTED NOT DETECTED Final   Streptococcus species DETECTED (A) NOT DETECTED Final    Comment: Not Enterococcus species, Streptococcus agalactiae, Streptococcus pyogenes, or Streptococcus pneumoniae. CRITICAL RESULT CALLED TO, READ BACK BY AND VERIFIED WITH: JASON ROBBINS ON 04/18/21 AT 0035 QSD    Streptococcus agalactiae NOT DETECTED NOT DETECTED Final   Streptococcus pneumoniae NOT DETECTED NOT DETECTED Final   Streptococcus pyogenes NOT DETECTED NOT DETECTED Final   A.calcoaceticus-baumannii NOT DETECTED NOT DETECTED Final   Bacteroides fragilis NOT DETECTED NOT DETECTED Final   Enterobacterales NOT DETECTED NOT DETECTED Final   Enterobacter cloacae complex NOT DETECTED NOT DETECTED Final   Escherichia coli NOT DETECTED NOT DETECTED Final   Klebsiella aerogenes NOT DETECTED NOT DETECTED Final   Klebsiella oxytoca NOT DETECTED NOT DETECTED Final   Klebsiella pneumoniae NOT DETECTED NOT DETECTED Final   Proteus species NOT DETECTED NOT DETECTED Final   Salmonella species NOT DETECTED NOT DETECTED Final   Serratia marcescens NOT DETECTED NOT DETECTED Final   Haemophilus influenzae NOT DETECTED NOT DETECTED Final   Neisseria meningitidis NOT DETECTED NOT DETECTED Final   Pseudomonas aeruginosa NOT DETECTED NOT DETECTED Final   Stenotrophomonas maltophilia NOT DETECTED NOT DETECTED Final   Candida albicans NOT DETECTED NOT  DETECTED Final   Candida auris NOT DETECTED NOT DETECTED Final   Candida glabrata NOT DETECTED NOT DETECTED Final   Candida krusei NOT DETECTED NOT DETECTED Final   Candida parapsilosis NOT DETECTED NOT DETECTED Final   Candida tropicalis NOT DETECTED NOT DETECTED Final   Cryptococcus neoformans/gattii NOT DETECTED NOT DETECTED Final    Comment: Performed at [facility], [address] [place], [place] [address]  MRSA PCR Screening     Status: None   Collection Time: 04/17/21  3:46 PM   Specimen: Nasal Mucosa; Nasopharyngeal  Result Value Ref Range Status   MRSA by PCR NEGATIVE NEGATIVE Final    Comment:        The GeneXpert MRSA Assay (FDA approved for NASAL specimens only), is one component of a comprehensive MRSA colonization surveillance program. It is not intended to diagnose MRSA  infection nor to guide or monitor treatment for MRSA infections. Performed at [facility], [address] [place], [place] [address]     Procedures and diagnostic studies:  No results found.             LOS: 10 days   Alden Feagan  [facility]   Pager on [email or web address]. If 7PM-7AM, please contact night-coverage at [email or web address]     04/19/2021, 4:26 PM

## 2021-04-19 NOTE — Plan of Care (Signed)

## 2021-04-19 NOTE — Progress Notes (Signed)
Spoke to son [name] on phone. Gave update on patient. Will be bringing phone charger later on.

## 2021-04-19 NOTE — Plan of Care (Signed)
  Problem: Clinical Measurements: Goal: Ability to maintain clinical measurements within normal limits will improve Outcome: Progressing Goal: Will remain free from infection Outcome: Progressing Goal: Diagnostic test results will improve Outcome: Progressing   Problem: Nutrition: Goal: Adequate nutrition will be maintained Outcome: Progressing   Problem: Elimination: Goal: Will not experience complications related to bowel motility Outcome: Progressing   Problem: Pain Managment: Goal: General experience of comfort will improve Outcome: Progressing

## 2021-04-20 ENCOUNTER — Inpatient Hospital Stay (HOSPITAL_COMMUNITY)
Admit: 2021-04-20 | Discharge: 2021-04-20 | Disposition: A | Discharge status: Home / Self Care | Payer: Medicare (Managed Care) | Attending: Internal Medicine | Admitting: Internal Medicine

## 2021-04-20 DIAGNOSIS — R7881 Bacteremia: Secondary | ICD-10-CM

## 2021-04-20 DIAGNOSIS — I10 Essential (primary) hypertension: Secondary | ICD-10-CM | POA: Diagnosis not present

## 2021-04-20 DIAGNOSIS — N186 End stage renal disease: Secondary | ICD-10-CM | POA: Diagnosis not present

## 2021-04-20 DIAGNOSIS — N171 Acute kidney failure with acute cortical necrosis: Secondary | ICD-10-CM | POA: Diagnosis not present

## 2021-04-20 DIAGNOSIS — E039 Hypothyroidism, unspecified: Secondary | ICD-10-CM | POA: Diagnosis not present

## 2021-04-20 LAB — ECHOCARDIOGRAM COMPLETE
AR max vel: 1.75 cm2
AV Area VTI: 2.04 cm2
AV Area mean vel: 1.78 cm2
AV Mean grad: 4.3 mmHg
AV Peak grad: 8 mmHg
Ao pk vel: 1.41 m/s
Area-P 1/2: 4.93 cm2
Height: 62 in
S' Lateral: 2.63 cm
Weight: 2051.2 oz

## 2021-04-20 LAB — CBC WITH DIFFERENTIAL/PLATELET
Abs Immature Granulocytes: 0.05 10*3/uL (ref 0.00–0.07)
Basophils Absolute: 0 10*3/uL (ref 0.0–0.1)
Basophils Relative: 0 %
Eosinophils Absolute: 0.2 10*3/uL (ref 0.0–0.5)
Eosinophils Relative: 3 %
HCT: 26.5 % — ABNORMAL LOW (ref 36.0–46.0)
Hemoglobin: 8.6 g/dL — ABNORMAL LOW (ref 12.0–15.0)
Immature Granulocytes: 1 %
Lymphocytes Relative: 11 %
Lymphs Abs: 0.9 10*3/uL (ref 0.7–4.0)
MCH: 29.5 pg (ref 26.0–34.0)
MCHC: 32.5 g/dL (ref 30.0–36.0)
MCV: 90.8 fL (ref 80.0–100.0)
Monocytes Absolute: 1.2 10*3/uL — ABNORMAL HIGH (ref 0.1–1.0)
Monocytes Relative: 15 %
Neutro Abs: 5.8 10*3/uL (ref 1.7–7.7)
Neutrophils Relative %: 70 %
Platelets: 268 10*3/uL (ref 150–400)
RBC: 2.92 MIL/uL — ABNORMAL LOW (ref 3.87–5.11)
RDW: 16 % — ABNORMAL HIGH (ref 11.5–15.5)
WBC: 8.2 10*3/uL (ref 4.0–10.5)
nRBC: 0 % (ref 0.0–0.2)

## 2021-04-20 LAB — BASIC METABOLIC PANEL
Anion gap: 11 (ref 5–15)
BUN: 39 mg/dL — ABNORMAL HIGH (ref 8–23)
CO2: 28 mmol/L (ref 22–32)
Calcium: 8.2 mg/dL — ABNORMAL LOW (ref 8.9–10.3)
Chloride: 97 mmol/L — ABNORMAL LOW (ref 98–111)
Creatinine, Ser: 5.26 mg/dL — ABNORMAL HIGH (ref 0.44–1.00)
GFR, Estimated: 8 mL/min — ABNORMAL LOW (ref >60)
Glucose, Bld: 157 mg/dL — ABNORMAL HIGH (ref 70–99)
Potassium: 3.6 mmol/L (ref 3.5–5.1)
Sodium: 136 mmol/L (ref 135–145)

## 2021-04-20 LAB — CULTURE, BLOOD (ROUTINE X 2)
Special Requests: ADEQUATE
Special Requests: ADEQUATE

## 2021-04-20 LAB — MAGNESIUM: Magnesium: 2 mg/dL (ref 1.7–2.4)

## 2021-04-20 LAB — PHOSPHORUS: Phosphorus: 2.2 mg/dL — ABNORMAL LOW (ref 2.5–4.6)

## 2021-04-20 MED ORDER — EPOETIN ALFA 10000 UNIT/ML IJ SOLN
4000.0000 [IU] | INTRAMUSCULAR | Status: DC
  Administered 2021-04-22 – 2021-04-24 (×2): 4000 [IU] via INTRAVENOUS

## 2021-04-20 MED ORDER — SODIUM CHLORIDE 0.9 % IV SOLN
2.0000 g | Freq: Two times a day (BID) | INTRAVENOUS | Status: DC
  Administered 2021-04-21 – 2021-04-23 (×5): 2 g via INTRAVENOUS
  Filled 2021-04-20 (×4): qty 2
  Filled 2021-04-20: qty 2000
  Filled 2021-04-20: qty 2

## 2021-04-20 NOTE — Progress Notes (Signed)
*  PRELIMINARY RESULTS* Echocardiogram 2D Echocardiogram has been performed.  Kirsten Mcmahon 04/20/2021, 8:31 AM

## 2021-04-20 NOTE — Progress Notes (Signed)
   04/20/21 1434  Assess: MEWS Score  Temp 98 F (36.7 C)  BP (!) 174/85  Pulse Rate (!) 116  Resp (!) 26  SpO2 100 %  O2 Device Room Air  Assess: MEWS Score  MEWS Temp 0  MEWS Systolic 0  MEWS Pulse 2  MEWS RR 2  MEWS LOC 0  MEWS Score 4  MEWS Score Color Red  Assess: if the MEWS score is Yellow or Red  Were vital signs taken at a resting state? Yes  Focused Assessment Change from prior assessment (see assessment flowsheet)  Early Detection of Sepsis Score *See Row Information* High  MEWS guidelines implemented *See Row Information* Yes  Treat  MEWS Interventions Escalated (See documentation below)  Pain Scale 0-10  Pain Score 0  Take Vital Signs  Increase Vital Sign Frequency  Red: Q 1hr X 4 then Q 4hr X 4, if remains red, continue Q 4hrs  Escalate  MEWS: Escalate Red: discuss with charge nurse/RN and provider, consider discussing with RRT  Notify: Charge Nurse/RN  Name of Charge Nurse/RN Notified Butch Penny  Date Charge Nurse/RN Notified 04/20/21  Time Charge Nurse/RN Notified 1400  Notify: Provider  Provider Name/Title [place]  Date Provider Notified 04/20/21  Time Provider Notified 1440  Notification Type Call  Notification Reason Change in status  Notify: Rapid Response  Name of Rapid Response RN Notified Sarah  Date Rapid Response Notified 04/20/21  Time Rapid Response Notified 1497  Document  Patient Outcome Stabilized after interventions  Progress note created (see row info) Yes  Assess: SIRS CRITERIA  SIRS Temperature  0  SIRS Pulse 1  SIRS Respirations  1  SIRS WBC 0  SIRS Score Sum  2

## 2021-04-20 NOTE — TOC Progression Note (Signed)
Transition of Care [facility]) - Progression Note    Patient Details  Name: Kirsten Mcmahon MRN: [id number] Date of Birth: 12-04-1946  Transition of Care [facility]) CM/SW [place], LCSW Phone Number: 04/20/2021, 1:28 PM  Clinical Narrative: Received call from son stating that he still wants to bring patient home at discharge.    Expected Discharge Plan: [place] Barriers to Discharge: Continued Medical Work up  Expected Discharge Plan and Services Expected Discharge Plan: [place] arrangements for the past 2 months: Single Family Home                                       Social Determinants of Health (SDOH) Interventions    Readmission Risk Interventions Readmission Risk Prevention Plan 04/11/2021  Transportation Screening Complete  PCP or Specialist Appt within 3-5 Days Complete  HRI or [place] Complete  Social Work Consult for [place] Planning/Counseling Complete  Palliative Care Screening Not Applicable  Medication Review Press photographer) Complete  Some recent data might be hidden

## 2021-04-20 NOTE — Progress Notes (Signed)
   04/20/21 1509  Assess: MEWS Score  Temp 98 F (36.7 C)  BP (!) 156/94  Pulse Rate (!) 118  Resp 20  SpO2 99 %  O2 Device Room Air  Assess: MEWS Score  MEWS Temp 0  MEWS Systolic 0  MEWS Pulse 2  MEWS RR 0  MEWS LOC 0  MEWS Score 2  MEWS Score Color Yellow  Assess: if the MEWS score is Yellow or Red  Were vital signs taken at a resting state? Yes  Focused Assessment No change from prior assessment  Early Detection of Sepsis Score *See Row Information* High  MEWS guidelines implemented *See Row Information* Yes  Treat  MEWS Interventions Administered scheduled meds/treatments  Pain Scale 0-10  Pain Score 0  Assess: SIRS CRITERIA  SIRS Temperature  0  SIRS Pulse 1  SIRS Respirations  0  SIRS WBC 0  SIRS Score Sum  1

## 2021-04-20 NOTE — Progress Notes (Signed)
PT Cancellation Note  Patient Details Name: Kirsten Mcmahon MRN: [id number] DOB: 11/14/1946   Cancelled Treatment:    Reason Eval/Treat Not Completed: Patient at procedure or test/unavailable (Patient currently off unit at dialysis. Will re-attempt therapy session at later time/date as medically appropriate and available.)  Georgeana Oertel H. [place], PT, DPT, NCS 04/20/21, 2:10 PM [phone]

## 2021-04-20 NOTE — Progress Notes (Signed)
Rapid response called at this time due to red mews.

## 2021-04-20 NOTE — Progress Notes (Signed)
Occupational Therapy Treatment Patient Details Name: Kirsten Mcmahon MRN: [id number] DOB: Apr 29, 1946 Today's Date: 04/20/2021    History of present illness Pt is a 75 year old female with PMH including CKD, HTN, Type 2 diabetes, ESRD, and prior CVA with L side hemiplegia.  Pt presented to ER secondary to fluid overload; admitted for management of acute/chronic CKD (stage IV).  Hopsital course significant for R perm-cath placement (5/6).   OT comments  Pt seen for OT tx this date. Pt agreeable with encouragement. Attempted to sit up EOB but unable to achieve with MAX A +1, required MAX A +2 ultimately. Static sitting balance improved with increased time and cues for R lateral lean and using RUE to support herself, noted with L lateral lean initially. Pt stood x2 requiring MAX A +2 each time from EOB with RW, very narrow base of support and unable to fix without assist, able to take a couple shuffled, small lateral steps EOB essentially with MAX A +2 to complete. Pt returned to bed, unable to safely attempt to get to recliner. Pt endorsing BLE pain with touch and transfers. Pt continues to require significant assist and increasing assist required today to perform ADL transfers. Continue to recommend SNF for short term rehab as safest option. If pt is to return home with son, will benefit from additional lift equipment (STS vs hoyer) and possible w/c.    Follow Up Recommendations  SNF;Supervision/Assistance - 24 hour    Equipment Recommendations  3 in 1 bedside commode;Tub/shower seat;Other (comment) (2WW)    Recommendations for Other Services      Precautions / Restrictions Precautions Precautions: Fall Restrictions Weight Bearing Restrictions: No       Mobility Bed Mobility Overal bed mobility: Needs Assistance Bed Mobility: Supine to Sit;Sit to Supine     Supine to sit: Max assist;+2 for physical assistance Sit to supine: Max assist;Mod assist;+2 for physical assistance   General  bed mobility comments: unable to complete with +1, BLE painful with touch, required MAX A +2 to achieve sitting EOB    Transfers Overall transfer level: Needs assistance Equipment used: Rolling walker (2 wheeled) Transfers: Sit to/from Stand Sit to Stand: +2 safety/equipment;From elevated surface;Max assist         General transfer comment: cues for safety, increased time, noted to L lean, improves with cues for R hand to hold onto EOB or foot of bed    Balance Overall balance assessment: Needs assistance Sitting-balance support: Single extremity supported;Feet supported Sitting balance-[name] Scale: Fair   Postural control: Left lateral lean Standing balance support: During functional activity;Bilateral upper extremity supported Standing balance-[name] Scale: Poor                             ADL either performed or assessed with clinical judgement   ADL Overall ADL's : Needs assistance/impaired                                       General ADL Comments: Pt currently requires MAX A for LB ADL, MIN-MODA  For UB ADL 2/2 L side hemiplegia, generalized weakness     Vision Patient Visual Report: No change from baseline     Perception     Praxis      Cognition Arousal/Alertness: Awake/alert Behavior During Therapy: Flat affect Overall Cognitive Status: No family/caregiver present to determine baseline  cognitive functioning                                 General Comments: following commands with cues, improving processing time        Exercises Other Exercises Other Exercises: Once in standing, lateral steps with Max A +2   Shoulder Instructions       General Comments      Pertinent Vitals/ Pain       Pain Assessment: Faces Faces Pain Scale: Hurts even more Pain Location: BLE with any mobility attempts Pain Descriptors / Indicators: Grimacing;Guarding;Moaning Pain Intervention(s): Limited activity within patient's  tolerance;Monitored during session;Repositioned  Home Living                                          Prior Functioning/Environment              Frequency  Min 1X/week        Progress Toward Goals  OT Goals(current goals can now be found in the care plan section)  Progress towards OT goals: OT to reassess next treatment  Acute Rehab OT Goals Patient Stated Goal: none stated OT Goal Formulation: Patient unable to participate in goal setting Time For Goal Achievement: 04/27/21 Potential to Achieve Goals: [place] Discharge plan remains appropriate;Frequency remains appropriate    Co-evaluation                 AM-PAC OT "6 Clicks" Daily Activity     Outcome Measure   Help from another person eating meals?: A Little Help from another person taking care of personal grooming?: A Little Help from another person toileting, which includes using toliet, bedpan, or urinal?: Total Help from another person bathing (including washing, rinsing, drying)?: A Lot Help from another person to put on and taking off regular upper body clothing?: A Little Help from another person to put on and taking off regular lower body clothing?: A Lot 6 Click Score: 14    End of Session Equipment Utilized During Treatment: Rolling walker;Gait belt  OT Visit Diagnosis: Other abnormalities of gait and mobility (R26.89);Other symptoms and signs involving cognitive function   Activity Tolerance Patient tolerated treatment well   Patient Left in bed;with call bell/phone within reach;with bed alarm set   Nurse Communication Mobility status        Time: 9476-5465 OT Time Calculation (min): 24 min  Charges: OT General Charges $OT Visit: 1 Visit OT Treatments $Self Care/Home Management : 23-37 mins  Hanley Hays, MPH, MS, OTR/L ascom [phone] 04/20/21, 2:14 PM

## 2021-04-20 NOTE — Progress Notes (Addendum)
[name]  ROUNDING NOTE   Subjective:   Patient seen laying in bed Alert and oriented to self and place Breakfast tray at bedside, pt needs assist with meals Denies nausea and shortness of breath  Patient seen later during dialysis in a chair   HEMODIALYSIS FLOWSHEET:  Blood Flow Rate (mL/min): 300 mL/min Arterial Pressure (mmHg): -130 mmHg Venous Pressure (mmHg): 110 mmHg Transmembrane Pressure (mmHg): 60 mmHg Ultrafiltration Rate (mL/min): 840 mL/min Dialysate Flow Rate (mL/min): 600 ml/min Conductivity: Machine : 14 Conductivity: Machine : 14 Dialysis Fluid Bolus: Normal Saline Bolus Amount (mL): 300 mL Dialysate Change: 2K  Objective:  Vital signs in last 24 hours:  Temp:  [97.5 F (36.4 C)-99 F (37.2 C)] 98.6 F (37 C) (05/16 0731) Pulse Rate:  [82-103] 93 (05/16 0731) Resp:  [16-18] 18 (05/16 0731) BP: (108-148)/(64-95) 118/64 (05/16 0731) SpO2:  [98 %-100 %] 98 % (05/16 0731) Weight:  [58.2 kg-59.3 kg] 58.2 kg (05/16 0601)  Weight change: 1.497 kg Filed Weights   04/19/21 0500 04/20/21 0500 04/20/21 0601  Weight: 57.8 kg 59.3 kg 58.2 kg    Intake/Output: I/O last 3 completed shifts: In: 994.3 [P.O.:680; I.V.:110.7; IV Piggyback:203.6] Out: 0    Intake/Output this shift:  No intake/output data recorded.  Physical Exam: General: NAD, resting in bed  Head: Normocephalic, atraumatic. Moist oral mucosal membranes  Eyes: Anicteric  Lungs:  Diminished in bases  Heart: Regular rhythm and rate  Abdomen:  Soft, nontender,  distended  Extremities:  no peripheral edema.  Neurologic: Alert and oriented to self  Skin: No lesions  Access: RIJ permcath 5/6 Dr. Lucky Cowboy    Basic Metabolic Panel: Recent Labs  Lab 04/13/21 1150 04/15/21 1007 04/16/21 1114 04/16/21 1210 04/17/21 0415 04/18/21 0408 04/19/21 0400 04/20/21 0337  NA 138 137  --  137 138 138 139 136  K 2.9* 3.4*  --  3.1* 3.5 3.6 3.6 3.6  CL 99 100  --  96* 100 100 100 97*   CO2 27 27  --  27 26 28 27 28   GLUCOSE 101* 125*  --  119* 123* 185* 156* 157*  BUN 13 11  --  <5* 12 28* 24* 39*  CREATININE 2.02* 3.00*  --  1.17* 3.08* 4.89* 3.64* 5.26*  CALCIUM 8.3* 7.9*  --  8.6* 8.3* 8.1* 8.3* 8.2*  MG  --  1.8 1.8  --   --  1.9  --  2.0  PHOS 2.2*  --   --  1.4* 3.9 3.2  --  2.2*    Liver Function Tests: Recent Labs  Lab 04/13/21 1150 04/16/21 1210  ALBUMIN 3.2* 3.2*   No results for input(s): LIPASE, AMYLASE in the last 168 hours. No results for input(s): AMMONIA in the last 168 hours.  CBC: Recent Labs  Lab 04/15/21 1007 04/16/21 1212 04/17/21 0415 04/18/21 0408 04/19/21 0400 04/20/21 0337  WBC 11.2* 18.2* 29.3* 17.2* 13.6* 8.2  NEUTROABS 8.5*  --  26.6* 15.3* 11.2* 5.8  HGB 9.1* 9.6* 8.5* 7.9* 8.0* 8.6*  HCT 27.0* 29.4* 26.7* 24.7* 25.4* 26.5*  MCV 88.2 88.3 92.1 92.5 92.0 90.8  PLT 206 257 226 209 230 268    Cardiac Enzymes: No results for input(s): CKTOTAL, CKMB, CKMBINDEX, TROPONINI in the last 168 hours.  BNP: Invalid input(s): POCBNP  CBG: Recent Labs  Lab 04/13/21 1615 04/13/21 2058 04/14/21 0724 04/14/21 1124 04/14/21 2144  GLUCAP 93 127* 82 126* 95    Microbiology: Results for orders placed or  performed during the hospital encounter of 04/09/21  Resp Panel by RT-PCR (Flu A&B, Covid) Nasopharyngeal Swab     Status: None   Collection Time: 04/09/21  5:58 PM   Specimen: Nasopharyngeal Swab; Nasopharyngeal(NP) swabs in vial transport medium  Result Value Ref Range Status   SARS Coronavirus 2 by RT PCR NEGATIVE NEGATIVE Final    Comment: (NOTE) SARS-CoV-2 target nucleic acids are NOT DETECTED.  The SARS-CoV-2 RNA is generally detectable in upper respiratory specimens during the acute phase of infection. The lowest concentration of SARS-CoV-2 viral copies this assay can detect is 138 copies/mL. A negative result does not preclude SARS-Cov-2 infection and should not be used as the sole basis for treatment or other  patient management decisions. A negative result may occur with  improper specimen collection/handling, submission of specimen other than nasopharyngeal swab, presence of viral mutation(s) within the areas targeted by this assay, and inadequate number of viral copies(<138 copies/mL). A negative result must be combined with clinical observations, patient history, and epidemiological information. The expected result is Negative.  Fact Sheet for Patients:  EntrepreneurPulse.com.au  Fact Sheet for Healthcare Providers:  IncredibleEmployment.be  This test is no t yet approved or cleared by the [place] FDA and  has been authorized for detection and/or diagnosis of SARS-CoV-2 by FDA under an Emergency Use Authorization (EUA). This EUA will remain  in effect (meaning this test can be used) for the duration of the COVID-19 declaration under Section 564(b)(1) of the Act, 21 U.S.C.section 360bbb-3(b)(1), unless the authorization is terminated  or revoked sooner.       Influenza A by PCR NEGATIVE NEGATIVE Final   Influenza B by PCR NEGATIVE NEGATIVE Final    Comment: (NOTE) The Xpert Xpress SARS-CoV-2/FLU/RSV plus assay is intended as an aid in the diagnosis of influenza from Nasopharyngeal swab specimens and should not be used as a sole basis for treatment. Nasal washings and aspirates are unacceptable for Xpert Xpress SARS-CoV-2/FLU/RSV testing.  Fact Sheet for Patients: EntrepreneurPulse.com.au  Fact Sheet for Healthcare Providers: IncredibleEmployment.be  This test is not yet approved or cleared by the [place] FDA and has been authorized for detection and/or diagnosis of SARS-CoV-2 by FDA under an Emergency Use Authorization (EUA). This EUA will remain in effect (meaning this test can be used) for the duration of the COVID-19 declaration under Section 564(b)(1) of the Act, 21 U.S.C. section  360bbb-3(b)(1), unless the authorization is terminated or revoked.  Performed at [facility], [address] [place], [place] [address]   CULTURE, BLOOD (ROUTINE X 2) w Reflex to ID Panel     Status: Abnormal   Collection Time: 04/17/21 11:58 AM   Specimen: BLOOD  Result Value Ref Range Status   Specimen Description   Final    BLOOD BLOOD RIGHT ARM Performed at [facility], [address], [place], [place] [address]    Special Requests   Final    BOTTLES DRAWN AEROBIC AND ANAEROBIC Blood Culture adequate volume Performed at [facility], [address], [place], [place] [address]    Culture  Setup Time   Final    GRAM POSITIVE COCCI IN BOTH AEROBIC AND ANAEROBIC BOTTLES CRITICAL RESULT CALLED TO, READ BACK BY AND VERIFIED WITH: JASON ROBBINS ON 04/18/21 AT 0035 QSD Performed at [facility], [address] [place], [place] [address]    Culture (A)  Final    STREPTOCOCCUS GROUP G SUSCEPTIBILITIES PERFORMED ON PREVIOUS CULTURE WITHIN THE LAST 5 DAYS. Performed  at [facility], [place] [address] [place], [place] [address]    Report Status 04/20/2021 FINAL  Final  CULTURE, BLOOD (ROUTINE X 2) w Reflex to ID Panel     Status: Abnormal   Collection Time: 04/17/21 11:58 AM   Specimen: BLOOD  Result Value Ref Range Status   Specimen Description   Final    BLOOD BLOOD RIGHT HAND Performed at [facility], [address], [place], [place] [address]    Special Requests   Final    BOTTLES DRAWN AEROBIC AND ANAEROBIC Blood Culture adequate volume Performed at [facility], [address], [place], [place] [address]    Culture  Setup Time   Final    GRAM POSITIVE COCCI IN BOTH AEROBIC AND ANAEROBIC BOTTLES CRITICAL RESULT CALLED TO, READ BACK BY AND VERIFIED WITH: JASON ROBBINS ON 04/18/21 AT 0035 QSD Performed at [facility], [place] [address], [place],  [address]    Culture STREPTOCOCCUS GROUP G (A)   Final   Report Status 04/20/2021 FINAL  Final   Organism ID, Bacteria STREPTOCOCCUS GROUP G  Final      Susceptibility   Streptococcus group g - MIC*    CLINDAMYCIN RESISTANT Resistant     AMPICILLIN <=0.25 SENSITIVE Sensitive     VANCOMYCIN 0.5 SENSITIVE Sensitive     CEFTRIAXONE <=0.12 SENSITIVE Sensitive     LEVOFLOXACIN 0.5 SENSITIVE Sensitive     PENICILLIN Value in next row Sensitive      SENSITIVE<=0.06    * STREPTOCOCCUS GROUP G  Blood Culture ID Panel (Reflexed)     Status: Abnormal   Collection Time: 04/17/21 11:58 AM  Result Value Ref Range Status   Enterococcus faecalis NOT DETECTED NOT DETECTED Final   Enterococcus Faecium NOT DETECTED NOT DETECTED Final   Listeria monocytogenes NOT DETECTED NOT DETECTED Final   Staphylococcus species NOT DETECTED NOT DETECTED Final   Staphylococcus aureus (BCID) NOT DETECTED NOT DETECTED Final   Staphylococcus epidermidis NOT DETECTED NOT DETECTED Final   Staphylococcus lugdunensis NOT DETECTED NOT DETECTED Final   Streptococcus species DETECTED (A) NOT DETECTED Final    Comment: Not Enterococcus species, Streptococcus agalactiae, Streptococcus pyogenes, or Streptococcus pneumoniae. CRITICAL RESULT CALLED TO, READ BACK BY AND VERIFIED WITH: JASON ROBBINS ON 04/18/21 AT 0035 QSD    Streptococcus agalactiae NOT DETECTED NOT DETECTED Final   Streptococcus pneumoniae NOT DETECTED NOT DETECTED Final   Streptococcus pyogenes NOT DETECTED NOT DETECTED Final   A.calcoaceticus-baumannii NOT DETECTED NOT DETECTED Final   Bacteroides fragilis NOT DETECTED NOT DETECTED Final   Enterobacterales NOT DETECTED NOT DETECTED Final   Enterobacter cloacae complex NOT DETECTED NOT DETECTED Final   Escherichia coli NOT DETECTED NOT DETECTED Final   Klebsiella aerogenes NOT DETECTED NOT DETECTED Final   Klebsiella oxytoca NOT DETECTED NOT DETECTED Final   Klebsiella pneumoniae NOT DETECTED NOT DETECTED Final   Proteus species NOT DETECTED NOT DETECTED  Final   Salmonella species NOT DETECTED NOT DETECTED Final   Serratia marcescens NOT DETECTED NOT DETECTED Final   Haemophilus influenzae NOT DETECTED NOT DETECTED Final   Neisseria meningitidis NOT DETECTED NOT DETECTED Final   Pseudomonas aeruginosa NOT DETECTED NOT DETECTED Final   Stenotrophomonas maltophilia NOT DETECTED NOT DETECTED Final   Candida albicans NOT DETECTED NOT DETECTED Final   Candida auris NOT DETECTED NOT DETECTED Final   Candida glabrata NOT DETECTED NOT DETECTED Final   Candida krusei NOT DETECTED NOT DETECTED Final   Candida parapsilosis  NOT DETECTED NOT DETECTED Final   Candida tropicalis NOT DETECTED NOT DETECTED Final   Cryptococcus neoformans/gattii NOT DETECTED NOT DETECTED Final    Comment: Performed at [facility], [address] [place], [place] [address]  MRSA PCR Screening     Status: None   Collection Time: 04/17/21  3:46 PM   Specimen: Nasal Mucosa; Nasopharyngeal  Result Value Ref Range Status   MRSA by PCR NEGATIVE NEGATIVE Final    Comment:        The GeneXpert MRSA Assay (FDA approved for NASAL specimens only), is one component of a comprehensive MRSA colonization surveillance program. It is not intended to diagnose MRSA infection nor to guide or monitor treatment for MRSA infections. Performed at [facility], [address] [place], [place] [address]   Culture, blood (single) w Reflex to ID Panel     Status: None (Preliminary result)   Collection Time: 04/20/21  3:37 AM   Specimen: BLOOD  Result Value Ref Range Status   Specimen Description BLOOD LEFT HAND  Final   Special Requests   Final    BOTTLES DRAWN AEROBIC AND ANAEROBIC Blood Culture adequate volume   Culture   Final    NO GROWTH < 12 HOURS Performed at [facility], [address] [place], [place] [address]    Report Status PENDING  Incomplete    Coagulation Studies: No results for input(s): LABPROT, INR in the last 72  hours.  Urinalysis: Recent Labs    04/17/21 1702  COLORURINE YELLOW*  LABSPEC 1.015  PHURINE 7.0  GLUCOSEU 150*  HGBUR NEGATIVE  BILIRUBINUR NEGATIVE  KETONESUR 5*  PROTEINUR >=300*  NITRITE NEGATIVE  LEUKOCYTESUR NEGATIVE      Imaging: No results found.   Medications:   . sodium chloride 250 mL (04/20/21 0540)  . cefTRIAXone (ROCEPHIN)  IV 2 g (04/20/21 0542)   . amLODipine  10 mg Oral Daily  . aspirin  300 mg Rectal Daily  . Chlorhexidine Gluconate Cloth  6 each Topical Q0600  . cholecalciferol  1,000 Units Oral Daily  . cloNIDine  0.3 mg Transdermal Weekly  . epoetin (EPOGEN/PROCRIT) injection  4,000 Units Intravenous Q T,Th,Sa-HD  . feeding supplement (NEPRO CARB STEADY)  237 mL Oral TID BM  . heparin  5,000 Units Subcutaneous Q8H  . latanoprost  1 drop Both Eyes QHS  . letrozole  2.5 mg Oral Daily  . levETIRAcetam  500 mg Oral Daily  . levothyroxine  112 mcg Oral Q0600  . metoprolol succinate  50 mg Oral Daily  . multivitamin  1 tablet Oral QHS   sodium chloride, acetaminophen **OR** acetaminophen, calcium carbonate, camphor-menthol **AND** hydrOXYzine, docusate sodium, hydrALAZINE, LORazepam, ondansetron **OR** ondansetron (ZOFRAN) IV, sorbitol  Assessment/ Plan:  [name] is a 75 y.o.  female  with past medical history of GERD, Hypertension, diabetes, hyperlipidemia, CVA and CKD stage 4. She presents to the ED with increased lower extremity edema. She is known to our team from previous admissions.   1. End Stage Renal Disease requiring new start hemodialysis:  First dialysis treatment 5/6.   -Scheduled to receive dialysis today - Requested patient be in chair for treatment - Outpatient planning: Dialysis liaison notified of outpatient center acceptance at [facility], MWF, 1115. All parties aware - Discussed discharge plan with patient, she agreeable to continue HD.   2. Anemia of chronic kidney disease  Lab Results  Component Value Date    HGB 8.6 (L) 04/20/2021  EPO with treatments   3. Secondary Hyperparathyroidism: with hypocalcemia   Lab Results  Component Value Date   PTH 161 (H) 01/18/2021   CALCIUM 8.2 (L) 04/20/2021   PHOS 2.2 (L) 04/20/2021  Phosphorus below target Oral intake improving  4.Diabetes mellitus type II with chronic kidney disease noninsulin dependent.  hemoglobin A1c 5.7 on 01/18/21.  Stable   5. Hypertension: Home regimen of clonidine, amlodipine, metoprolol continued Stable at this time   LOS: 11   5/16/202210:15 AM

## 2021-04-20 NOTE — Significant Event (Signed)
Rapid Response Event Note   Reason for Call :  Red MEWS  Initial Focused Assessment:  Rapid response RN arrived in patient's room with patient sitting up in chair. Per report from [facility] charge nurse and patient's nurse, patient just got back from dialysis and per recommendations from nephrology team and dialysis protocol, BP medications held prior to dialysis. Patient awake and with no complaints right now. Reports when asked that she feels better now after dialysis.   Patient's nurse notified Dr. Mal Misty of vital signs and MEWS score.  Interventions:  None.  Plan of Care:  Patient to remain on [place]. Patient's RN planning to give patient's scheduled metoprolol and amlodipine to see effect on vital signs. Patient also has prn hydralazine with BP parameters if needed.  Event Summary:   MD Notified: Dr. Mal Misty Call Time: 14:41 Arrival Time: 14:42 End Time: 14:50  Ericka Marcellus, Jaynie Bream, RN

## 2021-04-20 NOTE — Consult Note (Signed)
Infectious Disease     Reason for Consult:Grp G strep bacteremia    Referring Physician: Dr Mal Misty Date of Admission:  04/09/2021   Principal Problem:   Acute renal failure superimposed on stage 3b chronic kidney disease ([place]) Active Problems:   Hypertension   Hypothyroidism   GERD (gastroesophageal reflux disease)   Anemia in chronic kidney disease (CKD)   Type II diabetes mellitus with renal manifestations (HCC)   ESRD needing dialysis ([place])   Leukocytosis   Fever   Streptococcal sepsis, unspecified (HCC)   Streptococcal bacteremia   HPI: Kirsten Mcmahon is a 75 y.o. female admitted in May 5 with complaints of lower extremity edema in the setting of progressive chronic kidney disease.  On admit she was afebrile white blood count was 10.6.  Her creatinine had gone up to 7.9 with a BUN of 79 which was progressive compared to prior.  She was admitted for worsening renal failure.  She then developed fevers and leukocytosis may 12th-13th and had blood cultures done.  Blood cultures are growing group G strep.  Follow-up blood cultures from May's 15th and no growth to date.  She was started on May 13 on ceftriaxone also received a dose of meropenem and vancomycin.  Currently she is on ceftriaxone.  No fevers since May 14 and her white count has come down from 29,000-8.2.  Chest x-ray done May 13 was negative.  Echocardiogram done May 16 showed no evidence of vegetations.  Past Medical History:  Diagnosis Date  . Cancer [facility]) 2017   Right breast  . Depression   . GERD (gastroesophageal reflux disease)   . Glaucoma   . Hemiparesis ([place])    left side  . High cholesterol   . History of seizure    x 1 - after a spider bite  . History of stroke with residual deficit    left-side weakness  . Hypertension    states BP under control with meds., has been on med. x 2 yr.  . Hypothyroidism   . Non-insulin dependent type 2 diabetes mellitus ([place])   . Overactive bladder   . Stroke [facility])    1998  weakness on left side   Past Surgical History:  Procedure Laterality Date  . ABDOMINAL HYSTERECTOMY     complete  . BREAST BIOPSY Left 08/03/2018   Benign adipose tissue  . BREAST EXCISIONAL BIOPSY Right 2014   Positive  . BREAST LUMPECTOMY Right   . CATARACT EXTRACTION W/ INTRAOCULAR LENS IMPLANT Left   . CEREBRAL ANEURYSM REPAIR  1998  . DIALYSIS/PERMA CATHETER INSERTION N/A 04/10/2021   Procedure: DIALYSIS/PERMA CATHETER INSERTION;  Surgeon: Algernon Huxley, MD;  Location: [place] [facility];  Service: Cardiovascular;  Laterality: N/A;  . PICC LINE INSERTION    . THYROID LOBECTOMY Right 07/15/2017   Procedure: RIGHT THYROID LOBECTOMY;  Surgeon: Armandina Gemma, MD;  Location: [facility] OR;  Service: General;  Laterality: Right;   Social History   Tobacco Use  . Smoking status: Never Smoker  . Smokeless tobacco: Never Used  Vaping Use  . Vaping Use: Never used  Substance Use Topics  . Alcohol use: No  . Drug use: No   Family History  Problem Relation Age of Onset  . Cancer Brother        possible prostate cancer per her daughter  . Breast cancer Neg Hx     Allergies:  Allergies  Allergen Reactions  . Contrast Media [Iodinated Diagnostic Agents]  Other reaction(s): NO ALLERGY  . Latex     Other reaction(s): NO ALLERGY  . Shellfish-Derived Products     Other reaction(s): NO ALLERGY  . Levemir [Insulin Detemir] Itching    Current antibiotics: Antibiotics Given (last 72 hours)    Date/Time Action Medication Dose Rate   04/17/21 1632 New Bag/Given   vancomycin (VANCOREADY) IVPB 1500 mg/300 mL 1,500 mg 150 mL/hr   04/17/21 1821 New Bag/Given   meropenem (MERREM) 500 mg in sodium chloride 0.9 % 100 mL IVPB 500 mg 200 mL/hr   04/18/21 0451 New Bag/Given   cefTRIAXone (ROCEPHIN) 2 g in sodium chloride 0.9 % 100 mL IVPB 2 g 200 mL/hr   04/19/21 0527 New Bag/Given   cefTRIAXone (ROCEPHIN) 2 g in sodium chloride 0.9 % 100 mL IVPB 2 g 200 mL/hr   04/20/21 0542 New Bag/Given    cefTRIAXone (ROCEPHIN) 2 g in sodium chloride 0.9 % 100 mL IVPB 2 g 200 mL/hr      MEDICATIONS: . amLODipine  10 mg Oral Daily  . aspirin  300 mg Rectal Daily  . Chlorhexidine Gluconate Cloth  6 each Topical Q0600  . cholecalciferol  1,000 Units Oral Daily  . cloNIDine  0.3 mg Transdermal Weekly  . [START ON 04/22/2021] epoetin (EPOGEN/PROCRIT) injection  4,000 Units Intravenous Q M,W,F-HD  . feeding supplement (NEPRO CARB STEADY)  237 mL Oral TID BM  . heparin  5,000 Units Subcutaneous Q8H  . latanoprost  1 drop Both Eyes QHS  . letrozole  2.5 mg Oral Daily  . levETIRAcetam  500 mg Oral Daily  . levothyroxine  112 mcg Oral Q0600  . metoprolol succinate  50 mg Oral Daily  . multivitamin  1 tablet Oral QHS    Review of Systems - 11 systems reviewed and negative per HPI   OBJECTIVE: Temp:  [97.6 F (36.4 C)-99 F (37.2 C)] 98.2 F (36.8 C) (05/16 1030) Pulse Rate:  [55-116] 57 (05/16 1230) Resp:  [15-37] 37 (05/16 1330) BP: (100-148)/(56-95) 119/75 (05/16 1330) SpO2:  [93 %-100 %] 100 % (05/16 1330) Weight:  [58.2 kg-59.3 kg] 58.2 kg (05/16 0601) Physical Exam  Constitutional: Awake.  Sitting in the chair.  Somewhat slowed mentation.  Appears chronically ill. HENT: [place]/AT, PERRLA, no scleral icterus Mouth/Throat: Oropharynx is clear and moist. No oropharyngeal exudate.  Cardiovascular: Normal rate, regular rhythm and normal heart sounds. Exam reveals no gallop and no friction rub.  No murmur heard.  Pulmonary/Chest: Effort normal and breath sounds normal. No respiratory distress.  has no wheezes.  Neck = supple, no nuchal rigidity Right IJ hemodialysis catheter intact appears intact. Abdominal: Soft. Bowel sounds are normal.  exhibits no distension. There is no tenderness.  Lymphadenopathy: no cervical adenopathy. No axillary adenopathy Neurological: alert and oriented Skin: Skin is warm and dry. No rash noted. No erythema.  Psychiatric: a normal mood and affect.  behavior  is normal.    LABS: Results for orders placed or performed during the hospital encounter of 04/09/21 (from the past 48 hour(s))  Procalcitonin     Status: None   Collection Time: 04/19/21  4:00 AM  Result Value Ref Range   Procalcitonin 6.03 ng/mL    Comment:        Interpretation: PCT > 2 ng/mL: Systemic infection (sepsis) is likely, unless other causes are known. (NOTE)       Sepsis PCT Algorithm           Lower Respiratory Tract  Infection PCT Algorithm    ----------------------------     ----------------------------         PCT < 0.25 ng/mL                PCT < 0.10 ng/mL          Strongly encourage             Strongly discourage   discontinuation of antibiotics    initiation of antibiotics    ----------------------------     -----------------------------       PCT 0.25 - 0.50 ng/mL            PCT 0.10 - 0.25 ng/mL               OR       >80% decrease in PCT            Discourage initiation of                                            antibiotics      Encourage discontinuation           of antibiotics    ----------------------------     -----------------------------         PCT >= 0.50 ng/mL              PCT 0.26 - 0.50 ng/mL               AND       <80% decrease in PCT              Encourage initiation of                                             antibiotics       Encourage continuation           of antibiotics    ----------------------------     -----------------------------        PCT >= 0.50 ng/mL                  PCT > 0.50 ng/mL               AND         increase in PCT                  Strongly encourage                                      initiation of antibiotics    Strongly encourage escalation           of antibiotics                                     -----------------------------                                           PCT <= 0.25 ng/mL  OR                                         > 80% decrease in PCT                                      Discontinue / Do not initiate                                             antibiotics  Performed at [facility], [address] [place], [place] [address]   CBC with Differential/Platelet     Status: Abnormal   Collection Time: 04/19/21  4:00 AM  Result Value Ref Range   WBC 13.6 (H) 4.0 - 10.5 K/uL   RBC 2.76 (L) 3.87 - 5.11 MIL/uL   Hemoglobin 8.0 (L) 12.0 - 15.0 g/dL   HCT 25.4 (L) 36.0 - 46.0 %   MCV 92.0 80.0 - 100.0 fL   MCH 29.0 26.0 - 34.0 pg   MCHC 31.5 30.0 - 36.0 g/dL   RDW 16.3 (H) 11.5 - 15.5 %   Platelets 230 150 - 400 K/uL   nRBC 0.0 0.0 - 0.2 %   Neutrophils Relative % 83 %   Neutro Abs 11.2 (H) 1.7 - 7.7 K/uL   Lymphocytes Relative 5 %   Lymphs Abs 0.7 0.7 - 4.0 K/uL   Monocytes Relative 10 %   Monocytes Absolute 1.4 (H) 0.1 - 1.0 K/uL   Eosinophils Relative 1 %   Eosinophils Absolute 0.1 0.0 - 0.5 K/uL   Basophils Relative 0 %   Basophils Absolute 0.0 0.0 - 0.1 K/uL   Immature Granulocytes 1 %   Abs Immature Granulocytes 0.08 (H) 0.00 - 0.07 K/uL    Comment: Performed at [facility], [address] [place], [place] [address]  Basic metabolic panel     Status: Abnormal   Collection Time: 04/19/21  4:00 AM  Result Value Ref Range   Sodium 139 135 - 145 mmol/L   Potassium 3.6 3.5 - 5.1 mmol/L   Chloride 100 98 - 111 mmol/L   CO2 27 22 - 32 mmol/L   Glucose, Bld 156 (H) 70 - 99 mg/dL    Comment: Glucose reference range applies only to samples taken after fasting for at least 8 hours.   BUN 24 (H) 8 - 23 mg/dL   Creatinine, Ser 3.64 (H) 0.44 - 1.00 mg/dL   Calcium 8.3 (L) 8.9 - 10.3 mg/dL   GFR, Estimated 12 (L) >60 mL/min    Comment: (NOTE) Calculated using the CKD-EPI Creatinine Equation (2021)    Anion gap 12 5 - 15    Comment: Performed at [facility], [address] [place],  [address]  Culture, blood (single) w Reflex to ID Panel      Status: None (Preliminary result)   Collection Time: 04/20/21  3:37 AM   Specimen: BLOOD  Result Value Ref Range   Specimen Description BLOOD LEFT HAND    Special Requests      BOTTLES DRAWN AEROBIC AND ANAEROBIC Blood Culture adequate volume   Culture      NO GROWTH < 12 HOURS Performed at [facility], [address] [place], [place] [address]    Report Status PENDING   CBC with Differential/Platelet     Status: Abnormal   Collection Time: 04/20/21  3:37 AM  Result Value Ref Range   WBC 8.2 4.0 - 10.5 K/uL   RBC 2.92 (L) 3.87 - 5.11 MIL/uL   Hemoglobin 8.6 (L) 12.0 - 15.0 g/dL   HCT 26.5 (L) 36.0 - 46.0 %   MCV 90.8 80.0 - 100.0 fL   MCH 29.5 26.0 - 34.0 pg   MCHC 32.5 30.0 - 36.0 g/dL   RDW 16.0 (H) 11.5 - 15.5 %   Platelets 268 150 - 400 K/uL   nRBC 0.0 0.0 - 0.2 %   Neutrophils Relative % 70 %   Neutro Abs 5.8 1.7 - 7.7 K/uL   Lymphocytes Relative 11 %   Lymphs Abs 0.9 0.7 - 4.0 K/uL   Monocytes Relative 15 %   Monocytes Absolute 1.2 (H) 0.1 - 1.0 K/uL   Eosinophils Relative 3 %   Eosinophils Absolute 0.2 0.0 - 0.5 K/uL   Basophils Relative 0 %   Basophils Absolute 0.0 0.0 - 0.1 K/uL   Immature Granulocytes 1 %   Abs Immature Granulocytes 0.05 0.00 - 0.07 K/uL    Comment: Performed at [facility], [address] [place], [place] [address]  Magnesium     Status: None   Collection Time: 04/20/21  3:37 AM  Result Value Ref Range   Magnesium 2.0 1.7 - 2.4 mg/dL    Comment: Performed at [facility], [address] [place], [place] [address]  Phosphorus     Status: Abnormal   Collection Time: 04/20/21  3:37 AM  Result Value Ref Range   Phosphorus 2.2 (L) 2.5 - 4.6 mg/dL    Comment: Performed at [facility] [place], [place] [address]  Basic metabolic panel     Status: Abnormal   Collection Time: 04/20/21  3:37 AM  Result Value Ref Range   Sodium 136 135 - 145 mmol/L   Potassium 3.6 3.5 - 5.1 mmol/L    Chloride 97 (L) 98 - 111 mmol/L   CO2 28 22 - 32 mmol/L   Glucose, Bld 157 (H) 70 - 99 mg/dL    Comment: Glucose reference range applies only to samples taken after fasting for at least 8 hours.   BUN 39 (H) 8 - 23 mg/dL   Creatinine, Ser 5.26 (H) 0.44 - 1.00 mg/dL   Calcium 8.2 (L) 8.9 - 10.3 mg/dL   GFR, Estimated 8 (L) >60 mL/min    Comment: (NOTE) Calculated using the CKD-EPI Creatinine Equation (2021)    Anion gap 11 5 - 15    Comment: Performed at [facility], [address] [place], [place] [address]   No components found for: ESR, C REACTIVE PROTEIN MICRO: Recent Results (from the past 720 hour(s))  Resp Panel by RT-PCR (Flu A&B, Covid) Nasopharyngeal Swab     Status: None   Collection Time: 04/09/21  5:58 PM   Specimen: Nasopharyngeal Swab; Nasopharyngeal(NP) swabs in vial transport medium  Result Value Ref Range Status   SARS Coronavirus 2 by RT PCR NEGATIVE NEGATIVE Final    Comment: (NOTE) SARS-CoV-2 target nucleic acids are NOT DETECTED.  The SARS-CoV-2 RNA is generally detectable in upper respiratory specimens during the acute phase of infection. The lowest concentration of SARS-CoV-2 viral copies this assay can detect is 138 copies/mL. A negative result does not  preclude SARS-Cov-2 infection and should not be used as the sole basis for treatment or other patient management decisions. A negative result may occur with  improper specimen collection/handling, submission of specimen other than nasopharyngeal swab, presence of viral mutation(s) within the areas targeted by this assay, and inadequate number of viral copies(<138 copies/mL). A negative result must be combined with clinical observations, patient history, and epidemiological information. The expected result is Negative.  Fact Sheet for Patients:  EntrepreneurPulse.com.au  Fact Sheet for Healthcare Providers:  IncredibleEmployment.be  This test is no t yet  approved or cleared by the [place] FDA and  has been authorized for detection and/or diagnosis of SARS-CoV-2 by FDA under an Emergency Use Authorization (EUA). This EUA will remain  in effect (meaning this test can be used) for the duration of the COVID-19 declaration under Section 564(b)(1) of the Act, 21 U.S.C.section 360bbb-3(b)(1), unless the authorization is terminated  or revoked sooner.       Influenza A by PCR NEGATIVE NEGATIVE Final   Influenza B by PCR NEGATIVE NEGATIVE Final    Comment: (NOTE) The Xpert Xpress SARS-CoV-2/FLU/RSV plus assay is intended as an aid in the diagnosis of influenza from Nasopharyngeal swab specimens and should not be used as a sole basis for treatment. Nasal washings and aspirates are unacceptable for Xpert Xpress SARS-CoV-2/FLU/RSV testing.  Fact Sheet for Patients: EntrepreneurPulse.com.au  Fact Sheet for Healthcare Providers: IncredibleEmployment.be  This test is not yet approved or cleared by the [place] FDA and has been authorized for detection and/or diagnosis of SARS-CoV-2 by FDA under an Emergency Use Authorization (EUA). This EUA will remain in effect (meaning this test can be used) for the duration of the COVID-19 declaration under Section 564(b)(1) of the Act, 21 U.S.C. section 360bbb-3(b)(1), unless the authorization is terminated or revoked.  Performed at [facility], [address] [place], [place] [address]   CULTURE, BLOOD (ROUTINE X 2) w Reflex to ID Panel     Status: Abnormal   Collection Time: 04/17/21 11:58 AM   Specimen: BLOOD  Result Value Ref Range Status   Specimen Description   Final    BLOOD BLOOD RIGHT ARM Performed at [facility], [address], [place], [place] [address]    Special Requests   Final    BOTTLES DRAWN AEROBIC AND ANAEROBIC Blood Culture adequate volume Performed at [facility], [address], [place],  [place] [address]    Culture  Setup Time   Final    GRAM POSITIVE COCCI IN BOTH AEROBIC AND ANAEROBIC BOTTLES CRITICAL RESULT CALLED TO, READ BACK BY AND VERIFIED WITH: JASON ROBBINS ON 04/18/21 AT 0035 QSD Performed at [facility], [address] [place], [place] [address]    Culture (A)  Final    STREPTOCOCCUS GROUP G SUSCEPTIBILITIES PERFORMED ON PREVIOUS CULTURE WITHIN THE LAST 5 DAYS. Performed at [facility], [address], [place], [place] [address]    Report Status 04/20/2021 FINAL  Final  CULTURE, BLOOD (ROUTINE X 2) w Reflex to ID Panel     Status: Abnormal   Collection Time: 04/17/21 11:58 AM   Specimen: BLOOD  Result Value Ref Range Status   Specimen Description   Final    BLOOD BLOOD RIGHT HAND Performed at [facility], [address], [place],  [address]    Special Requests   Final    BOTTLES DRAWN AEROBIC AND ANAEROBIC Blood Culture adequate volume Performed at [facility]  [address] [place], [place] [address]    Culture  Setup Time   Final    GRAM POSITIVE COCCI IN BOTH AEROBIC AND ANAEROBIC BOTTLES CRITICAL RESULT CALLED TO, READ BACK BY AND VERIFIED WITH: JASON ROBBINS ON 04/18/21 AT 6213 QSD Performed at [facility], [address], [place], [place] [address]    Culture STREPTOCOCCUS GROUP G (A)  Final   Report Status 04/20/2021 FINAL  Final   Organism ID, Bacteria STREPTOCOCCUS GROUP G  Final      Susceptibility   Streptococcus group g - MIC*    CLINDAMYCIN RESISTANT Resistant     AMPICILLIN <=0.25 SENSITIVE Sensitive     VANCOMYCIN 0.5 SENSITIVE Sensitive     CEFTRIAXONE <=0.12 SENSITIVE Sensitive     LEVOFLOXACIN 0.5 SENSITIVE Sensitive     PENICILLIN Value in next row Sensitive      SENSITIVE<=0.06    * STREPTOCOCCUS GROUP G  Blood Culture ID Panel (Reflexed)     Status: Abnormal   Collection Time: 04/17/21 11:58 AM  Result Value Ref Range Status   Enterococcus faecalis NOT DETECTED NOT DETECTED  Final   Enterococcus Faecium NOT DETECTED NOT DETECTED Final   Listeria monocytogenes NOT DETECTED NOT DETECTED Final   Staphylococcus species NOT DETECTED NOT DETECTED Final   Staphylococcus aureus (BCID) NOT DETECTED NOT DETECTED Final   Staphylococcus epidermidis NOT DETECTED NOT DETECTED Final   Staphylococcus lugdunensis NOT DETECTED NOT DETECTED Final   Streptococcus species DETECTED (A) NOT DETECTED Final    Comment: Not Enterococcus species, Streptococcus agalactiae, Streptococcus pyogenes, or Streptococcus pneumoniae. CRITICAL RESULT CALLED TO, READ BACK BY AND VERIFIED WITH: JASON ROBBINS ON 04/18/21 AT 0035 QSD    Streptococcus agalactiae NOT DETECTED NOT DETECTED Final   Streptococcus pneumoniae NOT DETECTED NOT DETECTED Final   Streptococcus pyogenes NOT DETECTED NOT DETECTED Final   A.calcoaceticus-baumannii NOT DETECTED NOT DETECTED Final   Bacteroides fragilis NOT DETECTED NOT DETECTED Final   Enterobacterales NOT DETECTED NOT DETECTED Final   Enterobacter cloacae complex NOT DETECTED NOT DETECTED Final   Escherichia coli NOT DETECTED NOT DETECTED Final   Klebsiella aerogenes NOT DETECTED NOT DETECTED Final   Klebsiella oxytoca NOT DETECTED NOT DETECTED Final   Klebsiella pneumoniae NOT DETECTED NOT DETECTED Final   Proteus species NOT DETECTED NOT DETECTED Final   Salmonella species NOT DETECTED NOT DETECTED Final   Serratia marcescens NOT DETECTED NOT DETECTED Final   Haemophilus influenzae NOT DETECTED NOT DETECTED Final   Neisseria meningitidis NOT DETECTED NOT DETECTED Final   Pseudomonas aeruginosa NOT DETECTED NOT DETECTED Final   Stenotrophomonas maltophilia NOT DETECTED NOT DETECTED Final   Candida albicans NOT DETECTED NOT DETECTED Final   Candida auris NOT DETECTED NOT DETECTED Final   Candida glabrata NOT DETECTED NOT DETECTED Final   Candida krusei NOT DETECTED NOT DETECTED Final   Candida parapsilosis NOT DETECTED NOT DETECTED Final   Candida tropicalis  NOT DETECTED NOT DETECTED Final   Cryptococcus neoformans/gattii NOT DETECTED NOT DETECTED Final    Comment: Performed at [facility], [address] [place],  [address]  MRSA PCR Screening     Status: None   Collection Time: 04/17/21  3:46 PM   Specimen: Nasal Mucosa; Nasopharyngeal  Result Value Ref Range Status   MRSA by PCR NEGATIVE NEGATIVE Final    Comment:        The GeneXpert MRSA Assay (FDA approved for NASAL specimens only), is one component of a comprehensive MRSA colonization surveillance  program. It is not intended to diagnose MRSA infection nor to guide or monitor treatment for MRSA infections. Performed at [facility], [address] [place], [place] [address]   Culture, blood (single) w Reflex to ID Panel     Status: None (Preliminary result)   Collection Time: 04/20/21  3:37 AM   Specimen: BLOOD  Result Value Ref Range Status   Specimen Description BLOOD LEFT HAND  Final   Special Requests   Final    BOTTLES DRAWN AEROBIC AND ANAEROBIC Blood Culture adequate volume   Culture   Final    NO GROWTH < 12 HOURS Performed at [facility], [address] [place], [place] [address]    Report Status PENDING  Incomplete    IMAGING: DG Chest 2 View  Result Date: 04/09/2021 CLINICAL DATA:  BILATERAL leg swelling since yesterday, diabetes mellitus, hypertension, history stroke EXAM: CHEST - 2 VIEW COMPARISON:  01/18/2021 FINDINGS: Enlargement of cardiac silhouette. Mild tortuosity and atherosclerotic calcification of thoracic aorta. Mediastinal contours and pulmonary vascularity normal. Lungs clear. No acute infiltrate, pleural effusion, or pneumothorax. Bones demineralized. IMPRESSION: Enlargement of cardiac silhouette. No acute abnormalities. Aortic Atherosclerosis (ICD10-I70.0). Electronically Signed   By: Lavonia Dana M.D.   On: 04/09/2021 16:49   CT HEAD WO CONTRAST  Result Date: 04/14/2021 CLINICAL DATA:  Mental status change,  unknown cause. Additional history provided: Mental status change, past history of cerebral aneurysm repair, seizure and stroke. EXAM: CT HEAD WITHOUT CONTRAST TECHNIQUE: Contiguous axial images were obtained from the base of the skull through the vertex without intravenous contrast. COMPARISON:  Brain MRI 01/17/2021.  Head CT 01/17/2021. FINDINGS: Brain: Mild generalized cerebral and cerebellar atrophy. Redemonstrated chronic cortical/subcortical infarct within the right frontal operculum and right insula. Associated wallerian degeneration affecting the right brainstem. Redemonstrated chronic lacunar infarcts within the deep gray nuclei bilaterally. Background advanced patchy and ill-defined hypoattenuation within the cerebral white matter, nonspecific but compatible with chronic small vessel ischemic disease. Redemonstrated small chronic infarcts within the bilateral cerebellar hemispheres. There is no acute intracranial hemorrhage. No acute demarcated cortical infarct. No extra-axial fluid collection. No evidence of intracranial mass. No midline shift. Vascular: No hyperdense vessel.  Atherosclerotic calcifications. Skull: Prior right craniotomy.  No calvarial fracture. Sinuses/Orbits: Visualized orbits show no acute finding. No significant paranasal sinus disease at the imaged levels. IMPRESSION: No evidence of acute intracranial abnormality. Redemonstrated chronic cortical/subcortical infarct within the right frontal operculum and right insula with overlying craniotomy defect. Associated Wallerian degeneration of the right brainstem. Redemonstrated chronic lacunar infarcts within the deep gray nuclei bilaterally. Stable background severe cerebral white matter chronic small vessel ischemic disease. Redemonstrated chronic infarcts within the bilateral cerebellar hemispheres. Stable mild generalized parenchymal atrophy. Electronically Signed   By: Kellie Simmering DO   On: 04/14/2021 13:37   US Renal  Result Date:  04/09/2021 CLINICAL DATA:  Acute kidney injury. EXAM: RENAL / URINARY TRACT ULTRASOUND COMPLETE COMPARISON:  None. FINDINGS: Right Kidney: Renal measurements: 8.4 cm x 3.5 cm x 3.8 cm = volume: 59 mL. Diffusely increased echogenicity of the renal parenchyma is noted. No mass or hydronephrosis visualized. Left Kidney: Renal measurements: 9.0 cm x 4.6 cm x 3.8 cm = volume: 83 mL. Diffusely increased echogenicity of the renal parenchyma is noted. A 2.0 cm x 2.1 cm x 1.8 cm anechoic structure is seen within the mid left kidney. No abnormal flow is noted within this region on color Doppler evaluation. No hydronephrosis is visualized. Bladder: Appears normal for degree  of bladder distention. Other: None. IMPRESSION: 1. Increased renal echogenicity which may be secondary to medical renal disease. 2. Simple cyst within the left kidney. Electronically Signed   By: Virgina Norfolk M.D.   On: 04/09/2021 19:20   PERIPHERAL VASCULAR CATHETERIZATION  Result Date: 04/10/2021 See op note  US Venous Img Lower Unilateral Left (DVT)  Result Date: 04/10/2021 CLINICAL DATA:  Two day history of left lower extremity edema EXAM: LEFT LOWER EXTREMITY VENOUS DOPPLER ULTRASOUND TECHNIQUE: Gray-scale sonography with compression, as well as color and duplex ultrasound, were performed to evaluate the deep venous system(s) from the level of the common femoral vein through the popliteal and proximal calf veins. COMPARISON:  None. FINDINGS: VENOUS Normal compressibility of the common femoral, superficial femoral, and popliteal veins, as well as the visualized calf veins. Visualized portions of profunda femoral vein and great saphenous vein unremarkable. No filling defects to suggest DVT on grayscale or color Doppler imaging. Doppler waveforms show normal direction of venous flow, normal respiratory plasticity and response to augmentation. Limited views of the contralateral common femoral vein are unremarkable. OTHER Superficial subcutaneous  edema in the calf. Limitations: Calf veins are not well seen. IMPRESSION: 1. No evidence of deep venous thrombosis to the level of the knee. Calf veins are not well seen. Electronically Signed   By: Jacqulynn Cadet M.D.   On: 04/10/2021 14:50   DG Chest Port 1 View  Result Date: 04/17/2021 CLINICAL DATA:  Fever and leukocytosis. EXAM: PORTABLE CHEST 1 VIEW COMPARISON:  Chest x-ray dated Apr 09, 2021. FINDINGS: Patient is rotated to the right. New tunneled right internal jugular dialysis catheter with distal lumen tip in the right atrium. Unchanged cardiomegaly. Normal pulmonary vascularity. No focal consolidation, pleural effusion, or pneumothorax. No acute osseous abnormality. Old healed displaced fracture of the left humeral diaphysis. IMPRESSION: 1. No active disease. Electronically Signed   By: Titus Dubin M.D.   On: 04/17/2021 16:00   ECHOCARDIOGRAM COMPLETE  Result Date: 04/20/2021    ECHOCARDIOGRAM REPORT   Patient Name:   Kirsten Mcmahon Date of Exam: 04/20/2021 Medical Rec #:  [id number]         Height:       62.0 in Accession #:    [id number]        Weight:       128.2 lb Date of Birth:  Aug 17, 1946          BSA:          1.582 m Patient Age:    81 years          BP:           148/75 mmHg Patient Gender: F                 HR:           82 bpm. Exam Location:  [facility] Procedure: 2D Echo, Cardiac Doppler and Color Doppler Indications:     Bacteremia R78.81  History:         Patient has prior history of Echocardiogram examinations, most                  recent 07/02/2019. Stroke; Risk Factors:Hypertension.  Sonographer:     Sherrie Sport RDCS (AE) Referring Phys:  [place] Diagnosing Phys: Kathlyn Sacramento MD  Sonographer Comments: Suboptimal apical window. IMPRESSIONS  1. Left ventricular ejection fraction, by estimation, is 55 to 60%. The left ventricle has normal function. The left ventricle has no regional  wall motion abnormalities. There is mild left ventricular hypertrophy. Left  ventricular diastolic parameters are consistent with Grade I diastolic dysfunction (impaired relaxation).  2. Right ventricular systolic function is normal. The right ventricular size is normal. Tricuspid regurgitation signal is inadequate for assessing PA pressure.  3. Left atrial size was mildly dilated.  4. The mitral valve is normal in structure. No evidence of mitral valve regurgitation. No evidence of mitral stenosis.  5. The aortic valve is normal in structure. Aortic valve regurgitation is trivial. No aortic stenosis is present.  6. The inferior vena cava is normal in size with greater than 50% respiratory variability, suggesting right atrial pressure of 3 mmHg. FINDINGS  Left Ventricle: Left ventricular ejection fraction, by estimation, is 55 to 60%. The left ventricle has normal function. The left ventricle has no regional wall motion abnormalities. The left ventricular internal cavity size was normal in size. There is  mild left ventricular hypertrophy. Left ventricular diastolic parameters are consistent with Grade I diastolic dysfunction (impaired relaxation). Right Ventricle: The right ventricular size is normal. No increase in right ventricular wall thickness. Right ventricular systolic function is normal. Tricuspid regurgitation signal is inadequate for assessing PA pressure. Left Atrium: Left atrial size was mildly dilated. Right Atrium: Right atrial size was normal in size. Pericardium: There is no evidence of pericardial effusion. Mitral Valve: The mitral valve is normal in structure. No evidence of mitral valve regurgitation. No evidence of mitral valve stenosis. Tricuspid Valve: The tricuspid valve is normal in structure. Tricuspid valve regurgitation is not demonstrated. No evidence of tricuspid stenosis. Aortic Valve: The aortic valve is normal in structure. Aortic valve regurgitation is trivial. No aortic stenosis is present. Aortic valve mean gradient measures 4.3 mmHg. Aortic valve peak  gradient measures 8.0 mmHg. Aortic valve area, by VTI measures 2.04 cm. Pulmonic Valve: The pulmonic valve was normal in structure. Pulmonic valve regurgitation is not visualized. No evidence of pulmonic stenosis. Aorta: The aortic root is normal in size and structure. Venous: The inferior vena cava is normal in size with greater than 50% respiratory variability, suggesting right atrial pressure of 3 mmHg. IAS/Shunts: No atrial level shunt detected by color flow Doppler.  LEFT VENTRICLE PLAX 2D LVIDd:         3.72 cm  Diastology LVIDs:         2.63 cm  LV e' medial:    5.98 cm/s LV PW:         1.76 cm  LV E/e' medial:  10.0 LV IVS:        0.93 cm  LV e' lateral:   4.68 cm/s LVOT diam:     2.10 cm  LV E/e' lateral: 12.8 LV SV:         42 LV SV Index:   27 LVOT Area:     3.46 cm  RIGHT VENTRICLE RV Basal diam:  1.92 cm RV S prime:     12.10 cm/s TAPSE (M-mode): 3.0 cm LEFT ATRIUM             Index       RIGHT ATRIUM           Index LA diam:        3.00 cm 1.90 cm/m  RA Area:     11.70 cm LA Vol (A2C):   61.0 ml 38.55 ml/m RA Volume:   24.70 ml  15.61 ml/m LA Vol (A4C):   50.8 ml 32.10 ml/m LA Biplane Vol: 56.7 ml 35.83 ml/m  AORTIC VALVE                   PULMONIC VALVE AV Area (Vmax):    1.75 cm    PV Vmax:        0.87 m/s AV Area (Vmean):   1.78 cm    PV Peak grad:   3.1 mmHg AV Area (VTI):     2.04 cm    RVOT Peak grad: 5 mmHg AV Vmax:           141.00 cm/s AV Vmean:          96.833 cm/s AV VTI:            0.207 m AV Peak Grad:      8.0 mmHg AV Mean Grad:      4.3 mmHg LVOT Vmax:         71.10 cm/s LVOT Vmean:        49.700 cm/s LVOT VTI:          0.122 m LVOT/AV VTI ratio: 0.59  AORTA Ao Root diam: 3.20 cm MITRAL VALVE               TRICUSPID VALVE MV Area (PHT): 4.93 cm    TR Peak grad:   14.4 mmHg MV Decel Time: 154 msec    TR Vmax:        190.00 cm/s MV E velocity: 60.00 cm/s MV A velocity: 98.50 cm/s  SHUNTS MV E/A ratio:  0.61        Systemic VTI:  0.12 m                            Systemic Diam:  2.10 cm Kathlyn Sacramento MD Electronically signed by Kathlyn Sacramento MD Signature Date/Time: 04/20/2021/11:46:57 AM    Final     Assessment:   Kirsten Mcmahon is a 76 y.o. female with history of progressive chronic kidney disease admitted with worsening edema and found to have acute on chronic kidney disease.  She had a  hemodialysis catheter placed May 6 and has started hemodialysis.  She was afebrile on admission but may 12-13 developed high fevers as well as leukocytosis.  Blood cultures were done and are growing group G  strep.  Follow-up blood cultures are negative.  She was started on ceftriaxone and has defervesced.  White count had come down from 29,000 to 8000 .  She has had an echocardiogram with no evidence of vegetation. Unclear etiology of her bacteremia.  Group B strep can be given oral flora as well as GI flora.  Could also be from her hemodialysis catheter but I think that would be an unusual pathogen for this.  Group G strep several propensity to cause endocarditis however. Recommendations Needs TEE if feasbile to doing her. If repeat bcx + then will need HD cath removed and a period of line holiday before replacement of new 1. However if repeat cultures are negative at this point we can consider trying to treat with a line in place.  Thank you very much for allowing me to participate in the care of this patient. Please call with questions.   Cheral Marker. Ola Spurr, MD

## 2021-04-20 NOTE — Progress Notes (Addendum)
Progress Note    Kirsten Mcmahon  KZS:[id number] DOB: 07-28-1946  DOA: 04/09/2021 PCP: [facility]      Brief Narrative:    Medical records reviewed and are as summarized below:  Kirsten Mcmahon is a 75 y.o. female with medical history significant for CKD stage V, hypertension, hypothyroidism, diabetes mellitus, hyperlipidemia, history of stroke, seizure disorder, overactive bladder.  She presented to the hospital because of increased swelling of the lower extremities despite taking diuretics.  In the emergency room, she was noted to have a creatinine of 7.99 which was much higher than her previous creatinine of 3.99 (2 months prior).  She was admitted to the hospital for AKI on CKD stage V.  She was evaluated by the nephrologist.  Vascular surgeon was consulted and a right IJ tunneled hemodialysis permacath was placed on 04/10/2021.  Hemodialysis was initiated on 04/10/2021.      Assessment/Plan:   Principal Problem:   Acute renal failure superimposed on stage 3b chronic kidney disease (HCC) Active Problems:   Hypertension   Hypothyroidism   GERD (gastroesophageal reflux disease)   Anemia in chronic kidney disease (CKD)   Type II diabetes mellitus with renal manifestations ([place])   ESRD needing dialysis ([place])   Leukocytosis   Fever   Streptococcal sepsis, unspecified (HCC)   Streptococcal bacteremia   Body mass index is 23.45 kg/m.     ESRD: Hemodialysis was initiated on 04/10/2021.  She will continue hemodialysis as an outpatient.  She has an outpatient hemodialysis chair at [facility] on Mondays, Wednesdays and Fridays.   Streptococcal sepsis and bacteremia: Leukocytosis is improving.  Continue IV ceftriaxone.  No evidence of endocarditis on 2D echo.  EF is normal.  Consulted ID, Dr. Ola Spurr, for further recommendations.  Confusion, suspected metabolic encephalopathy/delirium, underlying cognitive impairment: Improving.  Probably close to  her baseline.  Hypokalemia and hypophosphatemia: Improved  Left lower extremity edema: No evidence of DVT.  Anemia of chronic disease: S/p transfusion with 1 unit of PRBCs on 04/11/2021.  Seizure disorder: Continue Keppra  Hypertension: Continue clonidine patch and other antihypertensives as able  Type II DM: NovoLog as needed for hyperglycemia.  CAD, history of stroke with left-sided weakness: Continue aspirin   Diet Order            DIET - DYS 1 Room service appropriate? Yes; Fluid consistency: Thin  Diet effective now                    Consultants:  Vascular surgeon  Nephrologist  Infectious disease  Procedures:  Right IJ permacath placed on 04/10/2021    Medications:   . amLODipine  10 mg Oral Daily  . aspirin  300 mg Rectal Daily  . Chlorhexidine Gluconate Cloth  6 each Topical Q0600  . cholecalciferol  1,000 Units Oral Daily  . cloNIDine  0.3 mg Transdermal Weekly  . [START ON 04/22/2021] epoetin (EPOGEN/PROCRIT) injection  4,000 Units Intravenous Q M,W,F-HD  . feeding supplement (NEPRO CARB STEADY)  237 mL Oral TID BM  . heparin  5,000 Units Subcutaneous Q8H  . latanoprost  1 drop Both Eyes QHS  . letrozole  2.5 mg Oral Daily  . levETIRAcetam  500 mg Oral Daily  . levothyroxine  112 mcg Oral Q0600  . metoprolol succinate  50 mg Oral Daily  . multivitamin  1 tablet Oral QHS   Continuous Infusions: . sodium chloride 250 mL (04/20/21 0540)  . cefTRIAXone (ROCEPHIN)  IV 2 g (04/20/21 0542)     Anti-infectives (From admission, onward)   Start     Dose/Rate Route Frequency Ordered Stop   04/18/21 1200  vancomycin (VANCOREADY) IVPB 750 mg/150 mL  Status:  Discontinued        750 mg 150 mL/hr over 60 Minutes Intravenous Every T-Th-Sa (Hemodialysis) 04/17/21 1436 04/18/21 0045   04/18/21 0500  cefTRIAXone (ROCEPHIN) 2 g in sodium chloride 0.9 % 100 mL IVPB        2 g 200 mL/hr over 30 Minutes Intravenous Every 24 hours 04/18/21 0047     04/17/21  1600  vancomycin (VANCOREADY) IVPB 1500 mg/300 mL        1,500 mg 150 mL/hr over 120 Minutes Intravenous  Once 04/17/21 1433 04/17/21 1832   04/17/21 1600  meropenem (MERREM) 500 mg in sodium chloride 0.9 % 100 mL IVPB  Status:  Discontinued        500 mg 200 mL/hr over 30 Minutes Intravenous Daily-1800 04/17/21 1505 04/18/21 0044   04/17/21 1500  cefTRIAXone (ROCEPHIN) 2 g in sodium chloride 0.9 % 100 mL IVPB  Status:  Discontinued       Note to Pharmacy: Antibiotics to be given after urine has been collected for urinalysis   2 g 200 mL/hr over 30 Minutes Intravenous Every 24 hours 04/17/21 1416 04/17/21 1500   04/11/21 0600  ceFAZolin (ANCEF) 1 g in sodium chloride 0.9 % 100 mL IVPB       Note to Pharmacy: To be given in specials   1 g 200 mL/hr over 30 Minutes Intravenous On call to O.R. 04/10/21 1432 04/10/21 1546             Family Communication/Anticipated D/C date and plan/Code Status   DVT prophylaxis: heparin injection 5,000 Units Start: 04/09/21 2200     Code Status: Full Code  Family Communication:  None Disposition Plan:    Status is: Inpatient  Remains inpatient appropriate because:IV treatments appropriate due to intensity of illness or inability to take PO and Inpatient level of care appropriate due to severity of illness   Dispo: The patient is from: Home              Anticipated d/c is to: Home              Patient currently is not medically stable to d/c.   Difficult to place patient No           Subjective:   Interval events noted.  She also complains when she feels better today.  Objective:    Vitals:   04/20/21 1130 04/20/21 1145 04/20/21 1200 04/20/21 1215  BP: 131/81 125/76 128/77 (!) 106/58  Pulse: (!) 107 (!) 116 (!) 58 (!) 55  Resp: 15 (!) 27 (!) 22 (!) 23  Temp:      TempSrc:      SpO2: 100% 98% 95% 93%  Weight:      Height:       No data found.   Intake/Output Summary (Last 24 hours) at 04/20/2021 1315 Last data  filed at 04/20/2021 0559 Gross per 24 hour  Intake 210 ml  Output 0 ml  Net 210 ml   Filed Weights   04/19/21 0500 04/20/21 0500 04/20/21 0601  Weight: 57.8 kg 59.3 kg 58.2 kg    Exam:  GEN: NAD SKIN: Warm and dry EYES: EOMI ENT: MMM CV: RRR PULM: CTA B ABD: soft, ND, NT, +BS CNS: AAO x 2 (person  and place).  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 EXT: No edema or tenderness         Data Reviewed:   I have personally reviewed following labs and imaging studies:  Labs: Labs show the following:   Basic Metabolic Panel: Recent Labs  Lab 04/15/21 1007 04/16/21 1114 04/16/21 1210 04/17/21 0415 04/18/21 0408 04/19/21 0400 04/20/21 0337  NA 137  --  137 138 138 139 136  K 3.4*  --  3.1* 3.5 3.6 3.6 3.6  CL 100  --  96* 100 100 100 97*  CO2 27  --  27 26 28 27 28   GLUCOSE 125*  --  119* 123* 185* 156* 157*  BUN 11  --  <5* 12 28* 24* 39*  CREATININE 3.00*  --  1.17* 3.08* 4.89* 3.64* 5.26*  CALCIUM 7.9*  --  8.6* 8.3* 8.1* 8.3* 8.2*  MG 1.8 1.8  --   --  1.9  --  2.0  PHOS  --   --  1.4* 3.9 3.2  --  2.2*   GFR Estimated Creatinine Clearance: 7.2 mL/min (A) (by C-G formula based on SCr of 5.26 mg/dL (H)). Liver Function Tests: Recent Labs  Lab 04/16/21 1210  ALBUMIN 3.2*   No results for input(s): LIPASE, AMYLASE in the last 168 hours. No results for input(s): AMMONIA in the last 168 hours. Coagulation profile No results for input(s): INR, PROTIME in the last 168 hours.  CBC: Recent Labs  Lab 04/15/21 1007 04/16/21 1212 04/17/21 0415 04/18/21 0408 04/19/21 0400 04/20/21 0337  WBC 11.2* 18.2* 29.3* 17.2* 13.6* 8.2  NEUTROABS 8.5*  --  26.6* 15.3* 11.2* 5.8  HGB 9.1* 9.6* 8.5* 7.9* 8.0* 8.6*  HCT 27.0* 29.4* 26.7* 24.7* 25.4* 26.5*  MCV 88.2 88.3 92.1 92.5 92.0 90.8  PLT 206 257 226 209 230 268   Cardiac Enzymes: No results for input(s): CKTOTAL, CKMB, CKMBINDEX, TROPONINI in the last 168 hours. BNP  (last 3 results) No results for input(s): PROBNP in the last 8760 hours. CBG: Recent Labs  Lab 04/13/21 1615 04/13/21 2058 04/14/21 0724 04/14/21 1124 04/14/21 2144  GLUCAP 93 127* 82 126* 95   D-Dimer: No results for input(s): DDIMER in the last 72 hours. Hgb A1c: No results for input(s): HGBA1C in the last 72 hours. Lipid Profile: No results for input(s): CHOL, HDL, LDLCALC, TRIG, CHOLHDL, LDLDIRECT in the last 72 hours. Thyroid function studies: No results for input(s): TSH, T4TOTAL, T3FREE, THYROIDAB in the last 72 hours.  Invalid input(s): FREET3 Anemia work up: No results for input(s): VITAMINB12, FOLATE, FERRITIN, TIBC, IRON, RETICCTPCT in the last 72 hours. Sepsis Labs: Recent Labs  Lab 04/17/21 0415 04/17/21 1158 04/17/21 1354 04/18/21 0408 04/19/21 0400 04/20/21 0337  PROCALCITON  --  1.88  --  5.20 6.03  --   WBC 29.3*  --   --  17.2* 13.6* 8.2  LATICACIDVEN  --  1.3 1.2  --   --   --     Microbiology Recent Results (from the past 240 hour(s))  CULTURE, BLOOD (ROUTINE X 2) w Reflex to ID Panel     Status: Abnormal   Collection Time: 04/17/21 11:58 AM   Specimen: BLOOD  Result Value Ref Range Status   Specimen Description   Final    BLOOD BLOOD RIGHT ARM Performed at [facility], [address] [place], [place] [address]    Special Requests   Final  BOTTLES DRAWN AEROBIC AND ANAEROBIC Blood Culture adequate volume Performed at [facility], [address] [place], [place] [address]    Culture  Setup Time   Final    GRAM POSITIVE COCCI IN BOTH AEROBIC AND ANAEROBIC BOTTLES CRITICAL RESULT CALLED TO, READ BACK BY AND VERIFIED WITH: JASON ROBBINS ON 04/18/21 AT 0035 QSD Performed at [facility], [address] [place], [place] [address]    Culture (A)  Final    STREPTOCOCCUS GROUP G SUSCEPTIBILITIES PERFORMED ON PREVIOUS CULTURE WITHIN THE LAST 5 DAYS. Performed at [facility], [place] [address],  [place], [place] [address]    Report Status 04/20/2021 FINAL  Final  CULTURE, BLOOD (ROUTINE X 2) w Reflex to ID Panel     Status: Abnormal   Collection Time: 04/17/21 11:58 AM   Specimen: BLOOD  Result Value Ref Range Status   Specimen Description   Final    BLOOD BLOOD RIGHT HAND Performed at [facility], [address], [place], [place] [address]    Special Requests   Final    BOTTLES DRAWN AEROBIC AND ANAEROBIC Blood Culture adequate volume Performed at [facility], [address], [place], [place] [address]    Culture  Setup Time   Final    GRAM POSITIVE COCCI IN BOTH AEROBIC AND ANAEROBIC BOTTLES CRITICAL RESULT CALLED TO, READ BACK BY AND VERIFIED WITH: JASON ROBBINS ON 04/18/21 AT 0035 QSD Performed at [facility], [place] [address], [place], [place] [address]    Culture STREPTOCOCCUS GROUP G (A)  Final   Report Status 04/20/2021 FINAL  Final   Organism ID, Bacteria STREPTOCOCCUS GROUP G  Final      Susceptibility   Streptococcus group g - MIC*    CLINDAMYCIN RESISTANT Resistant     AMPICILLIN <=0.25 SENSITIVE Sensitive     VANCOMYCIN 0.5 SENSITIVE Sensitive     CEFTRIAXONE <=0.12 SENSITIVE Sensitive     LEVOFLOXACIN 0.5 SENSITIVE Sensitive     PENICILLIN Value in next row Sensitive      SENSITIVE<=0.06    * STREPTOCOCCUS GROUP G  Blood Culture ID Panel (Reflexed)     Status: Abnormal   Collection Time: 04/17/21 11:58 AM  Result Value Ref Range Status   Enterococcus faecalis NOT DETECTED NOT DETECTED Final   Enterococcus Faecium NOT DETECTED NOT DETECTED Final   Listeria monocytogenes NOT DETECTED NOT DETECTED Final   Staphylococcus species NOT DETECTED NOT DETECTED Final   Staphylococcus aureus (BCID) NOT DETECTED NOT DETECTED Final   Staphylococcus epidermidis NOT DETECTED NOT DETECTED Final   Staphylococcus lugdunensis NOT DETECTED NOT DETECTED Final   Streptococcus species DETECTED (A) NOT DETECTED Final    Comment: Not Enterococcus  species, Streptococcus agalactiae, Streptococcus pyogenes, or Streptococcus pneumoniae. CRITICAL RESULT CALLED TO, READ BACK BY AND VERIFIED WITH: JASON ROBBINS ON 04/18/21 AT 0035 QSD    Streptococcus agalactiae NOT DETECTED NOT DETECTED Final   Streptococcus pneumoniae NOT DETECTED NOT DETECTED Final   Streptococcus pyogenes NOT DETECTED NOT DETECTED Final   A.calcoaceticus-baumannii NOT DETECTED NOT DETECTED Final   Bacteroides fragilis NOT DETECTED NOT DETECTED Final   Enterobacterales NOT DETECTED NOT DETECTED Final   Enterobacter cloacae complex NOT DETECTED NOT DETECTED Final   Escherichia coli NOT DETECTED NOT DETECTED Final   Klebsiella aerogenes NOT DETECTED NOT DETECTED Final   Klebsiella oxytoca NOT DETECTED NOT DETECTED Final   Klebsiella pneumoniae NOT DETECTED NOT DETECTED Final   Proteus species NOT DETECTED NOT  DETECTED Final   Salmonella species NOT DETECTED NOT DETECTED Final   Serratia marcescens NOT DETECTED NOT DETECTED Final   Haemophilus influenzae NOT DETECTED NOT DETECTED Final   Neisseria meningitidis NOT DETECTED NOT DETECTED Final   Pseudomonas aeruginosa NOT DETECTED NOT DETECTED Final   Stenotrophomonas maltophilia NOT DETECTED NOT DETECTED Final   Candida albicans NOT DETECTED NOT DETECTED Final   Candida auris NOT DETECTED NOT DETECTED Final   Candida glabrata NOT DETECTED NOT DETECTED Final   Candida krusei NOT DETECTED NOT DETECTED Final   Candida parapsilosis NOT DETECTED NOT DETECTED Final   Candida tropicalis NOT DETECTED NOT DETECTED Final   Cryptococcus neoformans/gattii NOT DETECTED NOT DETECTED Final    Comment: Performed at [facility], [place], [place] [address]  MRSA PCR Screening     Status: None   Collection Time: 04/17/21  3:46 PM   Specimen: Nasal Mucosa; Nasopharyngeal  Result Value Ref Range Status   MRSA by PCR NEGATIVE NEGATIVE Final    Comment:        The GeneXpert MRSA Assay (FDA approved for NASAL  specimens only), is one component of a comprehensive MRSA colonization surveillance program. It is not intended to diagnose MRSA infection nor to guide or monitor treatment for MRSA infections. Performed at [facility], [address] [place], [place] [address]   Culture, blood (single) w Reflex to ID Panel     Status: None (Preliminary result)   Collection Time: 04/20/21  3:37 AM   Specimen: BLOOD  Result Value Ref Range Status   Specimen Description BLOOD LEFT HAND  Final   Special Requests   Final    BOTTLES DRAWN AEROBIC AND ANAEROBIC Blood Culture adequate volume   Culture   Final    NO GROWTH < 12 HOURS Performed at [facility], [address], [place],  [address]    Report Status PENDING  Incomplete    Procedures and diagnostic studies:  ECHOCARDIOGRAM COMPLETE  Result Date: 04/20/2021    ECHOCARDIOGRAM REPORT   Patient Name:   [name] Date of Exam: 04/20/2021 Medical Rec #:  [id number]         Height:       62.0 in Accession #:    [id number]        Weight:       128.2 lb Date of Birth:  01-10-1946          BSA:          1.582 m Patient Age:    68 years          BP:           148/75 mmHg Patient Gender: F                 HR:           82 bpm. Exam Location:  [facility] Procedure: 2D Echo, Cardiac Doppler and Color Doppler Indications:     Bacteremia R78.81  History:         Patient has prior history of Echocardiogram examinations, most                  recent 07/02/2019. Stroke; Risk Factors:Hypertension.  Sonographer:     Sherrie Sport RDCS (AE) Referring Phys:  [place] Diagnosing Phys: Kathlyn Sacramento MD  Sonographer Comments: Suboptimal apical window. IMPRESSIONS  1. Left ventricular ejection fraction, by estimation, is 55 to 60%. The left ventricle has normal function.  The left ventricle has no regional wall motion abnormalities. There is mild left ventricular hypertrophy. Left ventricular diastolic parameters are consistent with Grade I  diastolic dysfunction (impaired relaxation).  2. Right ventricular systolic function is normal. The right ventricular size is normal. Tricuspid regurgitation signal is inadequate for assessing PA pressure.  3. Left atrial size was mildly dilated.  4. The mitral valve is normal in structure. No evidence of mitral valve regurgitation. No evidence of mitral stenosis.  5. The aortic valve is normal in structure. Aortic valve regurgitation is trivial. No aortic stenosis is present.  6. The inferior vena cava is normal in size with greater than 50% respiratory variability, suggesting right atrial pressure of 3 mmHg. FINDINGS  Left Ventricle: Left ventricular ejection fraction, by estimation, is 55 to 60%. The left ventricle has normal function. The left ventricle has no regional wall motion abnormalities. The left ventricular internal cavity size was normal in size. There is  mild left ventricular hypertrophy. Left ventricular diastolic parameters are consistent with Grade I diastolic dysfunction (impaired relaxation). Right Ventricle: The right ventricular size is normal. No increase in right ventricular wall thickness. Right ventricular systolic function is normal. Tricuspid regurgitation signal is inadequate for assessing PA pressure. Left Atrium: Left atrial size was mildly dilated. Right Atrium: Right atrial size was normal in size. Pericardium: There is no evidence of pericardial effusion. Mitral Valve: The mitral valve is normal in structure. No evidence of mitral valve regurgitation. No evidence of mitral valve stenosis. Tricuspid Valve: The tricuspid valve is normal in structure. Tricuspid valve regurgitation is not demonstrated. No evidence of tricuspid stenosis. Aortic Valve: The aortic valve is normal in structure. Aortic valve regurgitation is trivial. No aortic stenosis is present. Aortic valve mean gradient measures 4.3 mmHg. Aortic valve peak gradient measures 8.0 mmHg. Aortic valve area, by VTI measures  2.04 cm. Pulmonic Valve: The pulmonic valve was normal in structure. Pulmonic valve regurgitation is not visualized. No evidence of pulmonic stenosis. Aorta: The aortic root is normal in size and structure. Venous: The inferior vena cava is normal in size with greater than 50% respiratory variability, suggesting right atrial pressure of 3 mmHg. IAS/Shunts: No atrial level shunt detected by color flow Doppler.  LEFT VENTRICLE PLAX 2D LVIDd:         3.72 cm  Diastology LVIDs:         2.63 cm  LV e' medial:    5.98 cm/s LV PW:         1.76 cm  LV E/e' medial:  10.0 LV IVS:        0.93 cm  LV e' lateral:   4.68 cm/s LVOT diam:     2.10 cm  LV E/e' lateral: 12.8 LV SV:         42 LV SV Index:   27 LVOT Area:     3.46 cm  RIGHT VENTRICLE RV Basal diam:  1.92 cm RV S prime:     12.10 cm/s TAPSE (M-mode): 3.0 cm LEFT ATRIUM             Index       RIGHT ATRIUM           Index LA diam:        3.00 cm 1.90 cm/m  RA Area:     11.70 cm LA Vol (A2C):   61.0 ml 38.55 ml/m RA Volume:   24.70 ml  15.61 ml/m LA Vol (A4C):   50.8 ml 32.10 ml/m LA  Biplane Vol: 56.7 ml 35.83 ml/m  AORTIC VALVE                   PULMONIC VALVE AV Area (Vmax):    1.75 cm    PV Vmax:        0.87 m/s AV Area (Vmean):   1.78 cm    PV Peak grad:   3.1 mmHg AV Area (VTI):     2.04 cm    RVOT Peak grad: 5 mmHg AV Vmax:           141.00 cm/s AV Vmean:          96.833 cm/s AV VTI:            0.207 m AV Peak Grad:      8.0 mmHg AV Mean Grad:      4.3 mmHg LVOT Vmax:         71.10 cm/s LVOT Vmean:        49.700 cm/s LVOT VTI:          0.122 m LVOT/AV VTI ratio: 0.59  AORTA Ao Root diam: 3.20 cm MITRAL VALVE               TRICUSPID VALVE MV Area (PHT): 4.93 cm    TR Peak grad:   14.4 mmHg MV Decel Time: 154 msec    TR Vmax:        190.00 cm/s MV E velocity: 60.00 cm/s MV A velocity: 98.50 cm/s  SHUNTS MV E/A ratio:  0.61        Systemic VTI:  0.12 m                            Systemic Diam: 2.10 cm Kathlyn Sacramento MD Electronically signed by Kathlyn Sacramento MD Signature Date/Time: 04/20/2021/11:46:57 AM    Final                LOS: 11 days   Blimie Vaness  [facility]   Pager on [email or web address]. If 7PM-7AM, please contact night-coverage at [email or web address]     04/20/2021, 1:15 PM

## 2021-04-20 NOTE — Progress Notes (Signed)
   04/20/21 1434  Assess: MEWS Score  Temp 98 F (36.7 C)  BP (!) 174/85  Pulse Rate (!) 116  Resp (!) 26  SpO2 100 %  O2 Device Room Air  Assess: MEWS Score  MEWS Temp 0  MEWS Systolic 0  MEWS Pulse 2  MEWS RR 2  MEWS LOC 0  MEWS Score 4  MEWS Score Color Red  Assess: if the MEWS score is Yellow or Red  Were vital signs taken at a resting state? Yes  Focused Assessment Change from prior assessment (see assessment flowsheet)  Early Detection of Sepsis Score *See Row Information* High  MEWS guidelines implemented *See Row Information* Yes  Treat  MEWS Interventions Escalated (See documentation below)  Pain Scale 0-10  Pain Score 0  Notify: Charge Nurse/RN  Name of Charge Nurse/RN Notified Butch Penny  Date Charge Nurse/RN Notified 04/20/21  Time Charge Nurse/RN Notified 1400  Notify: Provider  Provider Name/Title [facility]  Date Provider Notified 04/20/21  Time Provider Notified 1440  Notification Type Call  Notification Reason Change in status  Notify: Rapid Response  Date Rapid Response Notified 04/20/21  Time Rapid Response Notified 9872  Assess: SIRS CRITERIA  SIRS Temperature  0  SIRS Pulse 1  SIRS Respirations  1  SIRS WBC 0  SIRS Score Sum  2

## 2021-04-21 MED ORDER — ENSURE ENLIVE PO LIQD
237.0000 mL | Freq: Three times a day (TID) | ORAL | Status: DC
  Administered 2021-04-21 – 2021-04-24 (×5): 237 mL via ORAL

## 2021-04-21 MED ORDER — ASPIRIN 81 MG PO CHEW
81.0000 mg | CHEWABLE_TABLET | Freq: Every day | ORAL | Status: DC
  Administered 2021-04-21 – 2021-04-24 (×3): 81 mg via ORAL
  Filled 2021-04-21 (×3): qty 1

## 2021-04-21 NOTE — Consult Note (Signed)
Cardiology Consultation Note    Patient ID: Kirsten Mcmahon, MRN: [id number], DOB/AGE: 77/02/1946 77 y.o. Admit date: 04/09/2021   Date of Consult: 04/21/2021 Primary Physician: [place] Primary Cardiologist:    Chief Complaint: fever/ams Reason for Consultation: tee Requesting MD: Dr. Mal Misty  HPI: Kirsten Mcmahon is a 75 y.o. female with history of stage IV kidney disease, hypertension, hypothyroidism, non-insulin-dependent diabetes was admitted on May 5 after being noted to have worsening edema with worsening renal function .  Her creatinine did increase to 7.99 up from a value of 3.99 several months ago.  She was admitted with acute on chronic renal insufficiency.  She was placed on hemodialysis on 04/10/2021.  She developed leukocytosis and fever and was found to have streptococcal sepsis and bacteremia.  She was placed on IV ceftriaxone.  Echocardiogram read showed no evidence of endocarditis or significant valvular abnormalities.  TEE was requested to better assess length of time for antibiotics.  Patient unable to give a history.  Had a long discussion with the patient's son, [name], who after risk and benefits of care discussed felt more comfortable if we deferred the TEE at present.  Past Medical History:  Diagnosis Date  . Cancer [facility]) 2017   Right breast  . Depression   . GERD (gastroesophageal reflux disease)   . Glaucoma   . Hemiparesis ([place])    left side  . High cholesterol   . History of seizure    x 1 - after a spider bite  . History of stroke with residual deficit    left-side weakness  . Hypertension    states BP under control with meds., has been on med. x 2 yr.  . Hypothyroidism   . Non-insulin dependent type 2 diabetes mellitus ([place])   . Overactive bladder   . Stroke [facility])    1998 weakness on left side      Surgical History:  Past Surgical History:  Procedure Laterality Date  . ABDOMINAL HYSTERECTOMY     complete  . BREAST BIOPSY  Left 08/03/2018   Benign adipose tissue  . BREAST EXCISIONAL BIOPSY Right 2014   Positive  . BREAST LUMPECTOMY Right   . CATARACT EXTRACTION W/ INTRAOCULAR LENS IMPLANT Left   . CEREBRAL ANEURYSM REPAIR  1998  . DIALYSIS/PERMA CATHETER INSERTION N/A 04/10/2021   Procedure: DIALYSIS/PERMA CATHETER INSERTION;  Surgeon: Algernon Huxley, MD;  Location: [facility];  Service: Cardiovascular;  Laterality: N/A;  . PICC LINE INSERTION    . THYROID LOBECTOMY Right 07/15/2017   Procedure: RIGHT THYROID LOBECTOMY;  Surgeon: Armandina Gemma, MD;  Location: [facility];  Service: General;  Laterality: Right;     Home Meds: Prior to Admission medications   Medication Sig Start Date End Date Taking? Authorizing Provider  amLODipine (NORVASC) 10 MG tablet Take 10 mg by mouth daily.   Yes [provider]  cholecalciferol (VITAMIN D) 1000 units tablet Take 1,000 Units by mouth daily.   Yes [provider]  cloNIDine (CATAPRES - DOSED IN MG/24 HR) 0.2 mg/24hr patch Place 0.2 mg onto the skin once a week. Wednesday 12/19/20  Yes [provider]  JANUVIA 25 MG tablet Take 25 mg by mouth daily. 10/23/20  Yes [provider]  KEPPRA XR 500 MG 24 hr tablet Take 500 mg by mouth daily. 01/21/21  Yes [provider]  latanoprost (XALATAN) 0.005 % ophthalmic solution Place 1 drop into both eyes at bedtime. 09/07/18  Yes  Cook, Jayce G, DO  letrozole (FEMARA) 2.5 MG tablet Take 1 tablet (2.5 mg total) by mouth daily. 02/16/19  Yes Nicholas Lose, MD  metoprolol succinate (TOPROL-XL) 25 MG 24 hr tablet Take 50 mg by mouth daily. 08/20/20  Yes [provider]  TYLENOL 325 MG tablet Take 650 mg by mouth daily as needed for pain. 10/02/20  Yes [provider]  alendronate (FOSAMAX) 70 MG tablet Take 1 tablet (70 mg total) by mouth once a week. Take with a full glass of water on an empty stomach. Patient not taking: Reported on 04/09/2021 09/07/18   Coral Spikes, DO   amLODipine (NORVASC) 2.5 MG tablet Take 2.5 mg by mouth daily. Take one tablet (2.5 mg) by mouth along with one (5 mg) tablet for total 7.5 mg once daily Patient not taking: Reported on 04/09/2021 09/25/20   [provider]  amLODipine (NORVASC) 5 MG tablet Take 1 tablet (5 mg total) by mouth daily. Patient not taking: No sig reported 09/07/18   Coral Spikes, DO  aspirin EC 81 MG tablet Take 81 mg by mouth daily. Patient not taking: No sig reported    [provider]  levETIRAcetam (KEPPRA) 500 MG tablet Take 1 tablet (500 mg total) by mouth daily. 01/22/21 02/21/21  Max Sane, MD  levothyroxine (SYNTHROID, LEVOTHROID) 112 MCG tablet Take 1 tablet (112 mcg total) by mouth daily before breakfast. Patient not taking: Reported on 04/09/2021 09/07/18   Coral Spikes, DO  loperamide (IMODIUM) 2 MG capsule Take 2 mg by mouth 2 (two) times daily as needed for diarrhea or loose stools. Patient not taking: Reported on 04/09/2021 12/18/20   [provider]  meclizine (ANTIVERT) 25 MG tablet Take 25 mg by mouth daily as needed for dizziness. Patient not taking: Reported on 04/09/2021 12/01/20   [provider]  mirabegron ER (MYRBETRIQ) 25 MG TB24 tablet Take 1 tablet (25 mg total) by mouth daily. Patient not taking: Reported on 04/09/2021 09/07/18   Coral Spikes, DO    Inpatient Medications:  . amLODipine  10 mg Oral Daily  . aspirin  300 mg Rectal Daily  . Chlorhexidine Gluconate Cloth  6 each Topical Q0600  . cholecalciferol  1,000 Units Oral Daily  . cloNIDine  0.3 mg Transdermal Weekly  . [START ON 04/22/2021] epoetin (EPOGEN/PROCRIT) injection  4,000 Units Intravenous Q M,W,F-HD  . feeding supplement (NEPRO CARB STEADY)  237 mL Oral TID BM  . heparin  5,000 Units Subcutaneous Q8H  . latanoprost  1 drop Both Eyes QHS  . letrozole  2.5 mg Oral Daily  . levETIRAcetam  500 mg Oral Daily  . levothyroxine  112 mcg Oral Q0600  . metoprolol succinate  50 mg Oral Daily  .  multivitamin  1 tablet Oral QHS   . sodium chloride 250 mL (04/21/21 0529)  . ampicillin (OMNIPEN) IV 2 g (04/21/21 0530)    Allergies:  Allergies  Allergen Reactions  . Contrast Media [Iodinated Diagnostic Agents]     Other reaction(s): NO ALLERGY  . Latex     Other reaction(s): NO ALLERGY  . Shellfish-Derived Products     Other reaction(s): NO ALLERGY  . Levemir [Insulin Detemir] Itching    Social History   Socioeconomic History  . Marital status: Single    Spouse name: Not on file  . Number of children: Not on file  . Years of education: Not on file  . Highest education level: Not on file  Occupational  History  . Not on file  Tobacco Use  . Smoking status: Never Smoker  . Smokeless tobacco: Never Used  Vaping Use  . Vaping Use: Never used  Substance and Sexual Activity  . Alcohol use: No  . Drug use: No  . Sexual activity: Never    Birth control/protection: Post-menopausal  Other Topics Concern  . Not on file  Social History Narrative   Lives with son and wife   Social Determinants of Health   Financial Resource Strain: Not on file  Food Insecurity: Not on file  Transportation Needs: Not on file  Physical Activity: Not on file  Stress: Not on file  Social Connections: Not on file  Intimate Partner Violence: Not on file     Family History  Problem Relation Age of Onset  . Cancer Brother        possible prostate cancer per her daughter  . Breast cancer Neg Hx      Review of Systems: A 12-system review of systems was performed and is negative except as noted in the HPI.  Labs: No results for input(s): CKTOTAL, CKMB, TROPONINI in the last 72 hours. Lab Results  Component Value Date   WBC 8.2 04/20/2021   HGB 8.6 (L) 04/20/2021   HCT 26.5 (L) 04/20/2021   MCV 90.8 04/20/2021   PLT 268 04/20/2021    Recent Labs  Lab 04/20/21 0337  NA 136  K 3.6  CL 97*  CO2 28  BUN 39*  CREATININE 5.26*  CALCIUM 8.2*  GLUCOSE 157*   No results found  for: CHOL, HDL, LDLCALC, TRIG No results found for: DDIMER  Radiology/Studies:  DG Chest 2 View  Result Date: 04/09/2021 CLINICAL DATA:  BILATERAL leg swelling since yesterday, diabetes mellitus, hypertension, history stroke EXAM: CHEST - 2 VIEW COMPARISON:  01/18/2021 FINDINGS: Enlargement of cardiac silhouette. Mild tortuosity and atherosclerotic calcification of thoracic aorta. Mediastinal contours and pulmonary vascularity normal. Lungs clear. No acute infiltrate, pleural effusion, or pneumothorax. Bones demineralized. IMPRESSION: Enlargement of cardiac silhouette. No acute abnormalities. Aortic Atherosclerosis (ICD10-I70.0). Electronically Signed   By: Lavonia Dana M.D.   On: 04/09/2021 16:49   CT HEAD WO CONTRAST  Result Date: 04/14/2021 CLINICAL DATA:  Mental status change, unknown cause. Additional history provided: Mental status change, past history of cerebral aneurysm repair, seizure and stroke. EXAM: CT HEAD WITHOUT CONTRAST TECHNIQUE: Contiguous axial images were obtained from the base of the skull through the vertex without intravenous contrast. COMPARISON:  Brain MRI 01/17/2021.  Head CT 01/17/2021. FINDINGS: Brain: Mild generalized cerebral and cerebellar atrophy. Redemonstrated chronic cortical/subcortical infarct within the right frontal operculum and right insula. Associated wallerian degeneration affecting the right brainstem. Redemonstrated chronic lacunar infarcts within the deep gray nuclei bilaterally. Background advanced patchy and ill-defined hypoattenuation within the cerebral white matter, nonspecific but compatible with chronic small vessel ischemic disease. Redemonstrated small chronic infarcts within the bilateral cerebellar hemispheres. There is no acute intracranial hemorrhage. No acute demarcated cortical infarct. No extra-axial fluid collection. No evidence of intracranial mass. No midline shift. Vascular: No hyperdense vessel.  Atherosclerotic calcifications. Skull: Prior  right craniotomy.  No calvarial fracture. Sinuses/Orbits: Visualized orbits show no acute finding. No significant paranasal sinus disease at the imaged levels. IMPRESSION: No evidence of acute intracranial abnormality. Redemonstrated chronic cortical/subcortical infarct within the right frontal operculum and right insula with overlying craniotomy defect. Associated Wallerian degeneration of the right brainstem. Redemonstrated chronic lacunar infarcts within the deep gray nuclei bilaterally. Stable background severe cerebral white matter  chronic small vessel ischemic disease. Redemonstrated chronic infarcts within the bilateral cerebellar hemispheres. Stable mild generalized parenchymal atrophy. Electronically Signed   By: Kellie Simmering DO   On: 04/14/2021 13:37   US Renal  Result Date: 04/09/2021 CLINICAL DATA:  Acute kidney injury. EXAM: RENAL / URINARY TRACT ULTRASOUND COMPLETE COMPARISON:  None. FINDINGS: Right Kidney: Renal measurements: 8.4 cm x 3.5 cm x 3.8 cm = volume: 59 mL. Diffusely increased echogenicity of the renal parenchyma is noted. No mass or hydronephrosis visualized. Left Kidney: Renal measurements: 9.0 cm x 4.6 cm x 3.8 cm = volume: 83 mL. Diffusely increased echogenicity of the renal parenchyma is noted. A 2.0 cm x 2.1 cm x 1.8 cm anechoic structure is seen within the mid left kidney. No abnormal flow is noted within this region on color Doppler evaluation. No hydronephrosis is visualized. Bladder: Appears normal for degree of bladder distention. Other: None. IMPRESSION: 1. Increased renal echogenicity which may be secondary to medical renal disease. 2. Simple cyst within the left kidney. Electronically Signed   By: Virgina Norfolk M.D.   On: 04/09/2021 19:20   PERIPHERAL VASCULAR CATHETERIZATION  Result Date: 04/10/2021 See op note  US Venous Img Lower Unilateral Left (DVT)  Result Date: 04/10/2021 CLINICAL DATA:  Two day history of left lower extremity edema EXAM: LEFT LOWER  EXTREMITY VENOUS DOPPLER ULTRASOUND TECHNIQUE: Gray-scale sonography with compression, as well as color and duplex ultrasound, were performed to evaluate the deep venous system(s) from the level of the common femoral vein through the popliteal and proximal calf veins. COMPARISON:  None. FINDINGS: VENOUS Normal compressibility of the common femoral, superficial femoral, and popliteal veins, as well as the visualized calf veins. Visualized portions of profunda femoral vein and great saphenous vein unremarkable. No filling defects to suggest DVT on grayscale or color Doppler imaging. Doppler waveforms show normal direction of venous flow, normal respiratory plasticity and response to augmentation. Limited views of the contralateral common femoral vein are unremarkable. OTHER Superficial subcutaneous edema in the calf. Limitations: Calf veins are not well seen. IMPRESSION: 1. No evidence of deep venous thrombosis to the level of the knee. Calf veins are not well seen. Electronically Signed   By: Jacqulynn Cadet M.D.   On: 04/10/2021 14:50   DG Chest Port 1 View  Result Date: 04/17/2021 CLINICAL DATA:  Fever and leukocytosis. EXAM: PORTABLE CHEST 1 VIEW COMPARISON:  Chest x-ray dated Apr 09, 2021. FINDINGS: Patient is rotated to the right. New tunneled right internal jugular dialysis catheter with distal lumen tip in the right atrium. Unchanged cardiomegaly. Normal pulmonary vascularity. No focal consolidation, pleural effusion, or pneumothorax. No acute osseous abnormality. Old healed displaced fracture of the left humeral diaphysis. IMPRESSION: 1. No active disease. Electronically Signed   By: Titus Dubin M.D.   On: 04/17/2021 16:00   ECHOCARDIOGRAM COMPLETE  Result Date: 04/20/2021    ECHOCARDIOGRAM REPORT   Patient Name:   Kirsten Mcmahon Date of Exam: 04/20/2021 Medical Rec #:  [id number]         Height:       62.0 in Accession #:    [id number]        Weight:       128.2 lb Date of Birth:  Oct 06, 1946           BSA:          1.582 m Patient Age:    20 years          BP:  148/75 mmHg Patient Gender: F                 HR:           82 bpm. Exam Location:  [facility] Procedure: 2D Echo, Cardiac Doppler and Color Doppler Indications:     Bacteremia R78.81  History:         Patient has prior history of Echocardiogram examinations, most                  recent 07/02/2019. Stroke; Risk Factors:Hypertension.  Sonographer:     Sherrie Sport RDCS (AE) Referring Phys:  Minor Diagnosing Phys: Kathlyn Sacramento MD  Sonographer Comments: Suboptimal apical window. IMPRESSIONS  1. Left ventricular ejection fraction, by estimation, is 55 to 60%. The left ventricle has normal function. The left ventricle has no regional wall motion abnormalities. There is mild left ventricular hypertrophy. Left ventricular diastolic parameters are consistent with Grade I diastolic dysfunction (impaired relaxation).  2. Right ventricular systolic function is normal. The right ventricular size is normal. Tricuspid regurgitation signal is inadequate for assessing PA pressure.  3. Left atrial size was mildly dilated.  4. The mitral valve is normal in structure. No evidence of mitral valve regurgitation. No evidence of mitral stenosis.  5. The aortic valve is normal in structure. Aortic valve regurgitation is trivial. No aortic stenosis is present.  6. The inferior vena cava is normal in size with greater than 50% respiratory variability, suggesting right atrial pressure of 3 mmHg. FINDINGS  Left Ventricle: Left ventricular ejection fraction, by estimation, is 55 to 60%. The left ventricle has normal function. The left ventricle has no regional wall motion abnormalities. The left ventricular internal cavity size was normal in size. There is  mild left ventricular hypertrophy. Left ventricular diastolic parameters are consistent with Grade I diastolic dysfunction (impaired relaxation). Right Ventricle: The right ventricular size is normal. No  increase in right ventricular wall thickness. Right ventricular systolic function is normal. Tricuspid regurgitation signal is inadequate for assessing PA pressure. Left Atrium: Left atrial size was mildly dilated. Right Atrium: Right atrial size was normal in size. Pericardium: There is no evidence of pericardial effusion. Mitral Valve: The mitral valve is normal in structure. No evidence of mitral valve regurgitation. No evidence of mitral valve stenosis. Tricuspid Valve: The tricuspid valve is normal in structure. Tricuspid valve regurgitation is not demonstrated. No evidence of tricuspid stenosis. Aortic Valve: The aortic valve is normal in structure. Aortic valve regurgitation is trivial. No aortic stenosis is present. Aortic valve mean gradient measures 4.3 mmHg. Aortic valve peak gradient measures 8.0 mmHg. Aortic valve area, by VTI measures 2.04 cm. Pulmonic Valve: The pulmonic valve was normal in structure. Pulmonic valve regurgitation is not visualized. No evidence of pulmonic stenosis. Aorta: The aortic root is normal in size and structure. Venous: The inferior vena cava is normal in size with greater than 50% respiratory variability, suggesting right atrial pressure of 3 mmHg. IAS/Shunts: No atrial level shunt detected by color flow Doppler.  LEFT VENTRICLE PLAX 2D LVIDd:         3.72 cm  Diastology LVIDs:         2.63 cm  LV e' medial:    5.98 cm/s LV PW:         1.76 cm  LV E/e' medial:  10.0 LV IVS:        0.93 cm  LV e' lateral:   4.68 cm/s LVOT diam:  2.10 cm  LV E/e' lateral: 12.8 LV SV:         42 LV SV Index:   27 LVOT Area:     3.46 cm  RIGHT VENTRICLE RV Basal diam:  1.92 cm RV S prime:     12.10 cm/s TAPSE (M-mode): 3.0 cm LEFT ATRIUM             Index       RIGHT ATRIUM           Index LA diam:        3.00 cm 1.90 cm/m  RA Area:     11.70 cm LA Vol (A2C):   61.0 ml 38.55 ml/m RA Volume:   24.70 ml  15.61 ml/m LA Vol (A4C):   50.8 ml 32.10 ml/m LA Biplane Vol: 56.7 ml 35.83 ml/m   AORTIC VALVE                   PULMONIC VALVE AV Area (Vmax):    1.75 cm    PV Vmax:        0.87 m/s AV Area (Vmean):   1.78 cm    PV Peak grad:   3.1 mmHg AV Area (VTI):     2.04 cm    RVOT Peak grad: 5 mmHg AV Vmax:           141.00 cm/s AV Vmean:          96.833 cm/s AV VTI:            0.207 m AV Peak Grad:      8.0 mmHg AV Mean Grad:      4.3 mmHg LVOT Vmax:         71.10 cm/s LVOT Vmean:        49.700 cm/s LVOT VTI:          0.122 m LVOT/AV VTI ratio: 0.59  AORTA Ao Root diam: 3.20 cm MITRAL VALVE               TRICUSPID VALVE MV Area (PHT): 4.93 cm    TR Peak grad:   14.4 mmHg MV Decel Time: 154 msec    TR Vmax:        190.00 cm/s MV E velocity: 60.00 cm/s MV A velocity: 98.50 cm/s  SHUNTS MV E/A ratio:  0.61        Systemic VTI:  0.12 m                            Systemic Diam: 2.10 cm Kathlyn Sacramento MD Electronically signed by Kathlyn Sacramento MD Signature Date/Time: 04/20/2021/11:46:57 AM    Final     Wt Readings from Last 3 Encounters:  04/21/21 56.2 kg  01/17/21 72 kg  02/16/19 71.5 kg    EKG: nsr  Physical Exam:  Blood pressure 113/60, pulse 87, temperature 97.9 F (36.6 C), temperature source Oral, resp. rate 18, height 5\' 2"  (1.575 m), weight 56.2 kg, SpO2 100 %. Body mass index is 22.68 kg/m. General: Well developed, well nourished, in no acute distress. Head: Normocephalic, atraumatic, sclera non-icteric, no xanthomas, nares are without discharge.  Neck: Negative for carotid bruits. JVD not elevated. Lungs: Clear bilaterally to auscultation without wheezes, rales, or rhonchi. Breathing is unlabored. Heart: RRR with S1 S2. No murmurs, rubs, or gallops appreciated. Abdomen: Soft, non-tender, non-distended with normoactive bowel sounds. No hepatomegaly. No rebound/guarding. No obvious abdominal masses. Msk:  Strength and tone  appear normal for age. Extremities: No clubbing or cyanosis. No edema.  Distal pedal pulses are 2+ and equal bilaterally. Neuro: Somewhat confused.      Assessment and Plan  Patient with end-stage renal disease on hemodialysis now with streptococcal bacteremia.  Transthoracic echo read yesterday revealed no significant mitral regurg aortic regurg tricuspid regurgitation.  No evidence of endocarditis.  TEE was requested.  Had a long discussion with the patient son who is the spokesperson for her as she is not able to give a history.  He prefers to defer the procedure at present.  We will follow along and proceed with TEE if consent is able to be given.  Patient son appears to understand the benefit and risk of the procedure. Signed, Teodoro Spray MD 04/21/2021, 8:51 AM Pager: [phone]

## 2021-04-21 NOTE — Progress Notes (Signed)
Nutrition Follow Up Note   DOCUMENTATION CODES:   Not applicable  INTERVENTION:   Nepro Shake po TID, each supplement provides 425 kcal and 19 grams protein  Magic cup TID with meals, each supplement provides 290 kcal and 9 grams of protein  Rena-vit po daily   Pt at high refeed risk; recommend monitor potassium, magnesium and phosphorus labs daily until stable  NUTRITION DIAGNOSIS:   Increased nutrient needs related to chronic illness (ESRD on HD) as evidenced by estimated needs.  GOAL:   Patient will meet greater than or equal to 90% of their needs  -not met   MONITOR:   PO intake,Supplement acceptance,Labs,Weight trends,Skin,I & O's  ASSESSMENT:   75 y.o. female with medical history significant for chronic kidney disease stage V and new HD, GERD, hypertension, hypothyroidism, non-insulin-dependent diabetes, hyperlipidemia, previous CVA, seizures and overactive bladder who is admitted with AMS. Pt found to have streptococcal sepsis and bacteremia   Pt s/p new HD 5/6  Visited pt's room today. Spoke with RN, pt continues to have poor appetite and oral intake; pt eating <50% of meals. Pt ate 30% of her lunch today and drank about 1/3 of a Nepro supplement. Pt's oral intake is improved from last week but still remains poor. Pt placed on a dysphagia 1 diet by RD last week as nurse tech reported that pt was holding food in her mouth. SLP evaluation pending to see if diet can be advanced as pt was eating well at the beginning of her admission. Pt more alert today. Pt prefers the vanilla or mixed berry Nepro. Spoke with RN, pt can have Ensure if she prefers as her electrolytes are improved. Pt is refeeding currently. Can consider appetite stimulant to see if this would help pt's oral intake. RD will reassess pt's oral intake after SLP evaluation. Per chart, pt is down ~32lbs since admit. Pt -8.1L on her I & O's. HD scheduled for tomorrow.   Medications reviewed and include: aspirin,  D3, epoetin, heparin, synthroid, rena-vit, omnipen   Labs reviewed: K 3.6 wnl, BUN 39(H), creat 5.26(H), P 2.2(L), Mg 2.0 wnl Hgb 8.6(L), Hct 26.5(L)  Diet Order:   Diet Order            DIET - DYS 1 Room service appropriate? Yes; Fluid consistency: Thin  Diet effective now                EDUCATION NEEDS:   No education needs have been identified at this time  Skin:  Skin Assessment: Reviewed RN Assessment  Last BM:  5/16- type 6  Height:   Ht Readings from Last 1 Encounters:  04/09/21 '5\' 2"'  (1.575 m)    Weight:   Wt Readings from Last 1 Encounters:  04/21/21 56.2 kg    Ideal Body Weight:  50 kg  BMI:  Body mass index is 22.68 kg/m.  Estimated Nutritional Needs:   Kcal:  1500-1700kcal/day  Protein:  75-85g/day  Fluid:  UOP +1L  Koleen Distance MS, RD, LDN Please refer to [facility] for RD and/or RD on-call/weekend/after hours pager

## 2021-04-21 NOTE — Progress Notes (Signed)
PT Cancellation Note  Patient Details Name: Kirsten Mcmahon MRN: [id number] DOB: 27-May-1946   Cancelled Treatment:    Reason Eval/Treat Not Completed:  (Treatment session attempted. Patient currently eating lunch; son at bedside. Will re-attempt at later time/date as medically appropriate and available.)  Jacolyn Joaquin H. Owens Shark, PT, DPT, NCS 04/21/21, 11:41 AM [phone]

## 2021-04-21 NOTE — Progress Notes (Signed)
[place] INFECTIOUS DISEASE PROGRESS NOTE Date of Admission:  04/09/2021     ID: Kirsten Mcmahon is a 75 y.o. female with Strep bacteremia Principal Problem:   Acute renal failure superimposed on stage 3b chronic kidney disease (HCC) Active Problems:   Hypertension   Hypothyroidism   GERD (gastroesophageal reflux disease)   Anemia in chronic kidney disease (CKD)   Type II diabetes mellitus with renal manifestations ([place])   ESRD needing dialysis ([place])   Leukocytosis   Fever   Streptococcal sepsis, unspecified ([place])   Streptococcal bacteremia   Subjective: No fevers, son decline TEE. No new complaints. Seems to be tolerating HD.   ROS  Eleven systems are reviewed and negative except per hpi  Medications:  Antibiotics Given (last 72 hours)    Date/Time Action Medication Dose Rate   04/19/21 0527 New Bag/Given   cefTRIAXone (ROCEPHIN) 2 g in sodium chloride 0.9 % 100 mL IVPB 2 g 200 mL/hr   04/20/21 0542 New Bag/Given   cefTRIAXone (ROCEPHIN) 2 g in sodium chloride 0.9 % 100 mL IVPB 2 g 200 mL/hr   04/21/21 0530 New Bag/Given   ampicillin (OMNIPEN) 2 g in sodium chloride 0.9 % 100 mL IVPB 2 g 300 mL/hr     . amLODipine  10 mg Oral Daily  . aspirin  81 mg Oral Daily  . Chlorhexidine Gluconate Cloth  6 each Topical Q0600  . cholecalciferol  1,000 Units Oral Daily  . cloNIDine  0.3 mg Transdermal Weekly  . [START ON 04/22/2021] epoetin (EPOGEN/PROCRIT) injection  4,000 Units Intravenous Q M,W,F-HD  . feeding supplement (NEPRO CARB STEADY)  237 mL Oral TID BM  . heparin  5,000 Units Subcutaneous Q8H  . latanoprost  1 drop Both Eyes QHS  . letrozole  2.5 mg Oral Daily  . levETIRAcetam  500 mg Oral Daily  . levothyroxine  112 mcg Oral Q0600  . metoprolol succinate  50 mg Oral Daily  . multivitamin  1 tablet Oral QHS    Objective: Vital signs in last 24 hours: Temp:  [97.7 F (36.5 C)-98.2 F (36.8 C)] 97.9 F (36.6 C) (05/17 0856) Pulse Rate:  [55-126] 83 (05/17  0901) Resp:  [15-43] 18 (05/17 0856) BP: (104-174)/(56-94) 110/68 (05/17 0856) SpO2:  [93 %-100 %] 100 % (05/17 0901) Weight:  [56.2 kg] 56.2 kg (05/17 0500) Constitutional: Awake.  Sitting in the chair.  Somewhat slowed mentation.  Appears chronically ill. HENT: [place]/AT, PERRLA, no scleral icterus Mouth/Throat: Oropharynx is clear and moist. No oropharyngeal exudate.  Cardiovascular: Normal rate, regular rhythm and normal heart sounds. Exam reveals no gallop and no friction rub.  No murmur heard.  Pulmonary/Chest: Effort normal and breath sounds normal. No respiratory distress.  has no wheezes.  Neck = supple, no nuchal rigidity Right IJ hemodialysis catheter intact appears intact. Abdominal: Soft. Bowel sounds are normal.  exhibits no distension. There is no tenderness.  Lymphadenopathy: no cervical adenopathy. No axillary adenopathy Neurological: alert and oriented Skin: Skin is warm and dry. No rash noted. No erythema.  Psychiatric: a normal mood and affect.  behavior is normal.   Lab Results Recent Labs    04/19/21 0400 04/20/21 0337  WBC 13.6* 8.2  HGB 8.0* 8.6*  HCT 25.4* 26.5*  NA 139 136  K 3.6 3.6  CL 100 97*  CO2 27 28  BUN 24* 39*  CREATININE 3.64* 5.26*    Microbiology: Results for orders placed or performed during the hospital encounter of 04/09/21  Resp  Panel by RT-PCR (Flu A&B, Covid) Nasopharyngeal Swab     Status: None   Collection Time: 04/09/21  5:58 PM   Specimen: Nasopharyngeal Swab; Nasopharyngeal(NP) swabs in vial transport medium  Result Value Ref Range Status   SARS Coronavirus 2 by RT PCR NEGATIVE NEGATIVE Final    Comment: (NOTE) SARS-CoV-2 target nucleic acids are NOT DETECTED.  The SARS-CoV-2 RNA is generally detectable in upper respiratory specimens during the acute phase of infection. The lowest concentration of SARS-CoV-2 viral copies this assay can detect is 138 copies/mL. A negative result does not preclude SARS-Cov-2 infection and  should not be used as the sole basis for treatment or other patient management decisions. A negative result may occur with  improper specimen collection/handling, submission of specimen other than nasopharyngeal swab, presence of viral mutation(s) within the areas targeted by this assay, and inadequate number of viral copies(<138 copies/mL). A negative result must be combined with clinical observations, patient history, and epidemiological information. The expected result is Negative.  Fact Sheet for Patients:  EntrepreneurPulse.com.au  Fact Sheet for Healthcare Providers:  IncredibleEmployment.be  This test is no t yet approved or cleared by the [place] FDA and  has been authorized for detection and/or diagnosis of SARS-CoV-2 by FDA under an Emergency Use Authorization (EUA). This EUA will remain  in effect (meaning this test can be used) for the duration of the COVID-19 declaration under Section 564(b)(1) of the Act, 21 U.S.C.section 360bbb-3(b)(1), unless the authorization is terminated  or revoked sooner.       Influenza A by PCR NEGATIVE NEGATIVE Final   Influenza B by PCR NEGATIVE NEGATIVE Final    Comment: (NOTE) The Xpert Xpress SARS-CoV-2/FLU/RSV plus assay is intended as an aid in the diagnosis of influenza from Nasopharyngeal swab specimens and should not be used as a sole basis for treatment. Nasal washings and aspirates are unacceptable for Xpert Xpress SARS-CoV-2/FLU/RSV testing.  Fact Sheet for Patients: EntrepreneurPulse.com.au  Fact Sheet for Healthcare Providers: IncredibleEmployment.be  This test is not yet approved or cleared by the [place] FDA and has been authorized for detection and/or diagnosis of SARS-CoV-2 by FDA under an Emergency Use Authorization (EUA). This EUA will remain in effect (meaning this test can be used) for the duration of the COVID-19 declaration  under Section 564(b)(1) of the Act, 21 U.S.C. section 360bbb-3(b)(1), unless the authorization is terminated or revoked.  Performed at [facility], [address] [place], [place] [address]   CULTURE, BLOOD (ROUTINE X 2) w Reflex to ID Panel     Status: Abnormal   Collection Time: 04/17/21 11:58 AM   Specimen: BLOOD  Result Value Ref Range Status   Specimen Description   Final    BLOOD BLOOD RIGHT ARM Performed at [facility], [address] [place], [place] [address]    Special Requests   Final    BOTTLES DRAWN AEROBIC AND ANAEROBIC Blood Culture adequate volume Performed at [facility], [address], [place], [place] [address]    Culture  Setup Time   Final    GRAM POSITIVE COCCI IN BOTH AEROBIC AND ANAEROBIC BOTTLES CRITICAL RESULT CALLED TO, READ BACK BY AND VERIFIED WITH: JASON ROBBINS ON 04/18/21 AT 0035 QSD Performed at [facility], [address] [place], [place] [address]    Culture (A)  Final    STREPTOCOCCUS GROUP G SUSCEPTIBILITIES PERFORMED ON PREVIOUS CULTURE WITHIN THE LAST 5 DAYS. Performed at [facility], [address] [address],  [place], [place] [address]    Report Status 04/20/2021 FINAL  Final  CULTURE, BLOOD (ROUTINE X 2) w Reflex to ID Panel     Status: Abnormal   Collection Time: 04/17/21 11:58 AM   Specimen: BLOOD  Result Value Ref Range Status   Specimen Description   Final    BLOOD BLOOD RIGHT HAND Performed at [facility], [address] [place], [place] [address]    Special Requests   Final    BOTTLES DRAWN AEROBIC AND ANAEROBIC Blood Culture adequate volume Performed at [facility], [address] [place], [place] [address]    Culture  Setup Time   Final    GRAM POSITIVE COCCI IN BOTH AEROBIC AND ANAEROBIC BOTTLES CRITICAL RESULT CALLED TO, READ BACK BY AND VERIFIED WITH: JASON ROBBINS ON 04/18/21 AT 9147 QSD Performed at [facility], [place] [address],  [place], [place] [address]    Culture STREPTOCOCCUS GROUP G (A)  Final   Report Status 04/20/2021 FINAL  Final   Organism ID, Bacteria STREPTOCOCCUS GROUP G  Final      Susceptibility   Streptococcus group g - MIC*    CLINDAMYCIN RESISTANT Resistant     AMPICILLIN <=0.25 SENSITIVE Sensitive     VANCOMYCIN 0.5 SENSITIVE Sensitive     CEFTRIAXONE <=0.12 SENSITIVE Sensitive     LEVOFLOXACIN 0.5 SENSITIVE Sensitive     PENICILLIN Value in next row Sensitive      SENSITIVE<=0.06    * STREPTOCOCCUS GROUP G  Blood Culture ID Panel (Reflexed)     Status: Abnormal   Collection Time: 04/17/21 11:58 AM  Result Value Ref Range Status   Enterococcus faecalis NOT DETECTED NOT DETECTED Final   Enterococcus Faecium NOT DETECTED NOT DETECTED Final   Listeria monocytogenes NOT DETECTED NOT DETECTED Final   Staphylococcus species NOT DETECTED NOT DETECTED Final   Staphylococcus aureus (BCID) NOT DETECTED NOT DETECTED Final   Staphylococcus epidermidis NOT DETECTED NOT DETECTED Final   Staphylococcus lugdunensis NOT DETECTED NOT DETECTED Final   Streptococcus species DETECTED (A) NOT DETECTED Final    Comment: Not Enterococcus species, Streptococcus agalactiae, Streptococcus pyogenes, or Streptococcus pneumoniae. CRITICAL RESULT CALLED TO, READ BACK BY AND VERIFIED WITH: JASON ROBBINS ON 04/18/21 AT 0035 QSD    Streptococcus agalactiae NOT DETECTED NOT DETECTED Final   Streptococcus pneumoniae NOT DETECTED NOT DETECTED Final   Streptococcus pyogenes NOT DETECTED NOT DETECTED Final   A.calcoaceticus-baumannii NOT DETECTED NOT DETECTED Final   Bacteroides fragilis NOT DETECTED NOT DETECTED Final   Enterobacterales NOT DETECTED NOT DETECTED Final   Enterobacter cloacae complex NOT DETECTED NOT DETECTED Final   Escherichia coli NOT DETECTED NOT DETECTED Final   Klebsiella aerogenes NOT DETECTED NOT DETECTED Final   Klebsiella oxytoca NOT DETECTED NOT DETECTED Final   Klebsiella pneumoniae NOT DETECTED NOT  DETECTED Final   Proteus species NOT DETECTED NOT DETECTED Final   Salmonella species NOT DETECTED NOT DETECTED Final   Serratia marcescens NOT DETECTED NOT DETECTED Final   Haemophilus influenzae NOT DETECTED NOT DETECTED Final   Neisseria meningitidis NOT DETECTED NOT DETECTED Final   Pseudomonas aeruginosa NOT DETECTED NOT DETECTED Final   Stenotrophomonas maltophilia NOT DETECTED NOT DETECTED Final   Candida albicans NOT DETECTED NOT DETECTED Final   Candida auris NOT DETECTED NOT DETECTED Final   Candida glabrata NOT DETECTED NOT DETECTED Final   Candida krusei NOT DETECTED NOT DETECTED Final   Candida parapsilosis NOT DETECTED NOT DETECTED Final   Candida tropicalis  NOT DETECTED NOT DETECTED Final   Cryptococcus neoformans/gattii NOT DETECTED NOT DETECTED Final    Comment: Performed at [facility], [address] [place], [place] [address]  MRSA PCR Screening     Status: None   Collection Time: 04/17/21  3:46 PM   Specimen: Nasal Mucosa; Nasopharyngeal  Result Value Ref Range Status   MRSA by PCR NEGATIVE NEGATIVE Final    Comment:        The GeneXpert MRSA Assay (FDA approved for NASAL specimens only), is one component of a comprehensive MRSA colonization surveillance program. It is not intended to diagnose MRSA infection nor to guide or monitor treatment for MRSA infections. Performed at [facility], [address] [place], [place] [address]   Culture, blood (single) w Reflex to ID Panel     Status: None (Preliminary result)   Collection Time: 04/20/21  3:37 AM   Specimen: BLOOD  Result Value Ref Range Status   Specimen Description BLOOD LEFT HAND  Final   Special Requests   Final    BOTTLES DRAWN AEROBIC AND ANAEROBIC Blood Culture adequate volume   Culture   Final    NO GROWTH 1 DAY Performed at [facility], [address] [place], [place] [address]    Report Status PENDING  Incomplete    Studies/Results: ECHOCARDIOGRAM  COMPLETE  Result Date: 04/20/2021    ECHOCARDIOGRAM REPORT   Patient Name:   Kirsten Mcmahon Date of Exam: 04/20/2021 Medical Rec #:  [id number]         Height:       62.0 in Accession #:    [id number]        Weight:       128.2 lb Date of Birth:  23-Oct-1946          BSA:          1.582 m Patient Age:    19 years          BP:           148/75 mmHg Patient Gender: F                 HR:           82 bpm. Exam Location:  [facility] Procedure: 2D Echo, Cardiac Doppler and Color Doppler Indications:     Bacteremia R78.81  History:         Patient has prior history of Echocardiogram examinations, most                  recent 07/02/2019. Stroke; Risk Factors:Hypertension.  Sonographer:     Sherrie Sport RDCS (AE) Referring Phys:  [place] Diagnosing Phys: Kathlyn Sacramento MD  Sonographer Comments: Suboptimal apical window. IMPRESSIONS  1. Left ventricular ejection fraction, by estimation, is 55 to 60%. The left ventricle has normal function. The left ventricle has no regional wall motion abnormalities. There is mild left ventricular hypertrophy. Left ventricular diastolic parameters are consistent with Grade I diastolic dysfunction (impaired relaxation).  2. Right ventricular systolic function is normal. The right ventricular size is normal. Tricuspid regurgitation signal is inadequate for assessing PA pressure.  3. Left atrial size was mildly dilated.  4. The mitral valve is normal in structure. No evidence of mitral valve regurgitation. No evidence of mitral stenosis.  5. The aortic valve is normal in structure. Aortic valve regurgitation is trivial. No aortic stenosis is present.  6. The inferior vena cava is normal in size with greater  than 50% respiratory variability, suggesting right atrial pressure of 3 mmHg. FINDINGS  Left Ventricle: Left ventricular ejection fraction, by estimation, is 55 to 60%. The left ventricle has normal function. The left ventricle has no regional wall motion abnormalities. The left  ventricular internal cavity size was normal in size. There is  mild left ventricular hypertrophy. Left ventricular diastolic parameters are consistent with Grade I diastolic dysfunction (impaired relaxation). Right Ventricle: The right ventricular size is normal. No increase in right ventricular wall thickness. Right ventricular systolic function is normal. Tricuspid regurgitation signal is inadequate for assessing PA pressure. Left Atrium: Left atrial size was mildly dilated. Right Atrium: Right atrial size was normal in size. Pericardium: There is no evidence of pericardial effusion. Mitral Valve: The mitral valve is normal in structure. No evidence of mitral valve regurgitation. No evidence of mitral valve stenosis. Tricuspid Valve: The tricuspid valve is normal in structure. Tricuspid valve regurgitation is not demonstrated. No evidence of tricuspid stenosis. Aortic Valve: The aortic valve is normal in structure. Aortic valve regurgitation is trivial. No aortic stenosis is present. Aortic valve mean gradient measures 4.3 mmHg. Aortic valve peak gradient measures 8.0 mmHg. Aortic valve area, by VTI measures 2.04 cm. Pulmonic Valve: The pulmonic valve was normal in structure. Pulmonic valve regurgitation is not visualized. No evidence of pulmonic stenosis. Aorta: The aortic root is normal in size and structure. Venous: The inferior vena cava is normal in size with greater than 50% respiratory variability, suggesting right atrial pressure of 3 mmHg. IAS/Shunts: No atrial level shunt detected by color flow Doppler.  LEFT VENTRICLE PLAX 2D LVIDd:         3.72 cm  Diastology LVIDs:         2.63 cm  LV e' medial:    5.98 cm/s LV PW:         1.76 cm  LV E/e' medial:  10.0 LV IVS:        0.93 cm  LV e' lateral:   4.68 cm/s LVOT diam:     2.10 cm  LV E/e' lateral: 12.8 LV SV:         42 LV SV Index:   27 LVOT Area:     3.46 cm  RIGHT VENTRICLE RV Basal diam:  1.92 cm RV S prime:     12.10 cm/s TAPSE (M-mode): 3.0 cm  LEFT ATRIUM             Index       RIGHT ATRIUM           Index LA diam:        3.00 cm 1.90 cm/m  RA Area:     11.70 cm LA Vol (A2C):   61.0 ml 38.55 ml/m RA Volume:   24.70 ml  15.61 ml/m LA Vol (A4C):   50.8 ml 32.10 ml/m LA Biplane Vol: 56.7 ml 35.83 ml/m  AORTIC VALVE                   PULMONIC VALVE AV Area (Vmax):    1.75 cm    PV Vmax:        0.87 m/s AV Area (Vmean):   1.78 cm    PV Peak grad:   3.1 mmHg AV Area (VTI):     2.04 cm    RVOT Peak grad: 5 mmHg AV Vmax:           141.00 cm/s AV Vmean:  96.833 cm/s AV VTI:            0.207 m AV Peak Grad:      8.0 mmHg AV Mean Grad:      4.3 mmHg LVOT Vmax:         71.10 cm/s LVOT Vmean:        49.700 cm/s LVOT VTI:          0.122 m LVOT/AV VTI ratio: 0.59  AORTA Ao Root diam: 3.20 cm MITRAL VALVE               TRICUSPID VALVE MV Area (PHT): 4.93 cm    TR Peak grad:   14.4 mmHg MV Decel Time: 154 msec    TR Vmax:        190.00 cm/s MV E velocity: 60.00 cm/s MV A velocity: 98.50 cm/s  SHUNTS MV E/A ratio:  0.61        Systemic VTI:  0.12 m                            Systemic Diam: 2.10 cm Kathlyn Sacramento MD Electronically signed by Kathlyn Sacramento MD Signature Date/Time: 04/20/2021/11:46:57 AM    Final     Assessment/Plan: Kirsten Mcmahon is a 75 y.o. female with history of progressive chronic kidney disease admitted with worsening edema and found to have acute on chronic kidney disease.  She had a  hemodialysis catheter placed May 6 and has started hemodialysis.  She was afebrile on admission but may 12-13 developed high fevers as well as leukocytosis.  Blood cultures were done and are growing group G  strep in 2 sets.   She was started on ceftriaxone and has defervesced.  White count had come down from 29,000 to 8000 .  She has had an echocardiogram with no evidence of vegetation. Unclear etiology of her bacteremia.  Group B strep can be part of oral flora as well as GI flora.  Could also be from her hemodialysis catheter but I think that  would be an unusual pathogen for this.  Group G strep several propensity to cause endocarditis however.  5/17 - no fevers, fu bcx neg 5/16.  Recommendations Son declines TEE. Can treat with 6 weeks IV vanco since cannot rule out endocarditis.  If repeat bcx + then will need HD cath removed and a period of line holiday before replacement of new 1. However if repeat cultures are negative at this point we can consider trying to treat with a line in place.  Thank you very much for the consult. Will follow with you.  Leonel Ramsay   04/21/2021, 11:17 AM

## 2021-04-21 NOTE — Progress Notes (Signed)
[name]  ROUNDING NOTE   Subjective:   Patient seen laying in bed, eating breakfast Alert and oriented to self and place Tolerating meals Denies nausea and shortness of breath Patient seen later with son at bedside  Objective:  Vital signs in last 24 hours:  Temp:  [97.7 F (36.5 C)-98.2 F (36.8 C)] 97.9 F (36.6 C) (05/17 0856) Pulse Rate:  [55-126] 83 (05/17 0901) Resp:  [15-43] 18 (05/17 0856) BP: (100-174)/(56-94) 110/68 (05/17 0856) SpO2:  [93 %-100 %] 100 % (05/17 0901) Weight:  [56.2 kg] 56.2 kg (05/17 0500)  Weight change: -3.084 kg Filed Weights   04/20/21 0500 04/20/21 0601 04/21/21 0500  Weight: 59.3 kg 58.2 kg 56.2 kg    Intake/Output: I/O last 3 completed shifts: In: 0  Out: 1500 [Other:1500]   Intake/Output this shift:  No intake/output data recorded.  Physical Exam: General: NAD, resting in bed  Head: Normocephalic, atraumatic. Moist oral mucosal membranes  Eyes: Anicteric  Lungs:  Diminished in bases  Heart: Regular rhythm and rate  Abdomen:  Soft, nontender,  distended  Extremities:  no peripheral edema.  Neurologic: Alert and oriented to self  Skin: No lesions  Access: RIJ permcath 5/6 Dr. Lucky Cowboy    Basic Metabolic Panel: Recent Labs  Lab 04/15/21 1007 04/16/21 1114 04/16/21 1210 04/17/21 0415 04/18/21 0408 04/19/21 0400 04/20/21 0337  NA 137  --  137 138 138 139 136  K 3.4*  --  3.1* 3.5 3.6 3.6 3.6  CL 100  --  96* 100 100 100 97*  CO2 27  --  27 26 28 27 28   GLUCOSE 125*  --  119* 123* 185* 156* 157*  BUN 11  --  <5* 12 28* 24* 39*  CREATININE 3.00*  --  1.17* 3.08* 4.89* 3.64* 5.26*  CALCIUM 7.9*  --  8.6* 8.3* 8.1* 8.3* 8.2*  MG 1.8 1.8  --   --  1.9  --  2.0  PHOS  --   --  1.4* 3.9 3.2  --  2.2*    Liver Function Tests: Recent Labs  Lab 04/16/21 1210  ALBUMIN 3.2*   No results for input(s): LIPASE, AMYLASE in the last 168 hours. No results for input(s): AMMONIA in the last 168  hours.  CBC: Recent Labs  Lab 04/15/21 1007 04/16/21 1212 04/17/21 0415 04/18/21 0408 04/19/21 0400 04/20/21 0337  WBC 11.2* 18.2* 29.3* 17.2* 13.6* 8.2  NEUTROABS 8.5*  --  26.6* 15.3* 11.2* 5.8  HGB 9.1* 9.6* 8.5* 7.9* 8.0* 8.6*  HCT 27.0* 29.4* 26.7* 24.7* 25.4* 26.5*  MCV 88.2 88.3 92.1 92.5 92.0 90.8  PLT 206 257 226 209 230 268    Cardiac Enzymes: No results for input(s): CKTOTAL, CKMB, CKMBINDEX, TROPONINI in the last 168 hours.  BNP: Invalid input(s): POCBNP  CBG: Recent Labs  Lab 04/14/21 1124 04/14/21 2144  GLUCAP 126* 95    Microbiology: Results for orders placed or performed during the hospital encounter of 04/09/21  Resp Panel by RT-PCR (Flu A&B, Covid) Nasopharyngeal Swab     Status: None   Collection Time: 04/09/21  5:58 PM   Specimen: Nasopharyngeal Swab; Nasopharyngeal(NP) swabs in vial transport medium  Result Value Ref Range Status   SARS Coronavirus 2 by RT PCR NEGATIVE NEGATIVE Final    Comment: (NOTE) SARS-CoV-2 target nucleic acids are NOT DETECTED.  The SARS-CoV-2 RNA is generally detectable in upper respiratory specimens during the acute phase of infection. The lowest concentration of SARS-CoV-2 viral copies  this assay can detect is 138 copies/mL. A negative result does not preclude SARS-Cov-2 infection and should not be used as the sole basis for treatment or other patient management decisions. A negative result may occur with  improper specimen collection/handling, submission of specimen other than nasopharyngeal swab, presence of viral mutation(s) within the areas targeted by this assay, and inadequate number of viral copies(<138 copies/mL). A negative result must be combined with clinical observations, patient history, and epidemiological information. The expected result is Negative.  Fact Sheet for Patients:  EntrepreneurPulse.com.au  Fact Sheet for Healthcare Providers:   IncredibleEmployment.be  This test is no t yet approved or cleared by the [place] FDA and  has been authorized for detection and/or diagnosis of SARS-CoV-2 by FDA under an Emergency Use Authorization (EUA). This EUA will remain  in effect (meaning this test can be used) for the duration of the COVID-19 declaration under Section 564(b)(1) of the Act, 21 U.S.C.section 360bbb-3(b)(1), unless the authorization is terminated  or revoked sooner.       Influenza A by PCR NEGATIVE NEGATIVE Final   Influenza B by PCR NEGATIVE NEGATIVE Final    Comment: (NOTE) The Xpert Xpress SARS-CoV-2/FLU/RSV plus assay is intended as an aid in the diagnosis of influenza from Nasopharyngeal swab specimens and should not be used as a sole basis for treatment. Nasal washings and aspirates are unacceptable for Xpert Xpress SARS-CoV-2/FLU/RSV testing.  Fact Sheet for Patients: EntrepreneurPulse.com.au  Fact Sheet for Healthcare Providers: IncredibleEmployment.be  This test is not yet approved or cleared by the [place] FDA and has been authorized for detection and/or diagnosis of SARS-CoV-2 by FDA under an Emergency Use Authorization (EUA). This EUA will remain in effect (meaning this test can be used) for the duration of the COVID-19 declaration under Section 564(b)(1) of the Act, 21 U.S.C. section 360bbb-3(b)(1), unless the authorization is terminated or revoked.  Performed at [facility], [address] [place], [place] [address]   CULTURE, BLOOD (ROUTINE X 2) w Reflex to ID Panel     Status: Abnormal   Collection Time: 04/17/21 11:58 AM   Specimen: BLOOD  Result Value Ref Range Status   Specimen Description   Final    BLOOD BLOOD RIGHT ARM Performed at [facility], [address] [place], [place] [address]    Special Requests   Final    BOTTLES DRAWN AEROBIC AND ANAEROBIC Blood Culture adequate  volume Performed at [facility], [address], [place], [place] [address]    Culture  Setup Time   Final    GRAM POSITIVE COCCI IN BOTH AEROBIC AND ANAEROBIC BOTTLES CRITICAL RESULT CALLED TO, READ BACK BY AND VERIFIED WITH: JASON ROBBINS ON 04/18/21 AT 0035 QSD Performed at [facility], [address] [place], [place] [address]    Culture (A)  Final    STREPTOCOCCUS GROUP G SUSCEPTIBILITIES PERFORMED ON PREVIOUS CULTURE WITHIN THE LAST 5 DAYS. Performed at [facility], [place] [address] [place], [place] [address]    Report Status 04/20/2021 FINAL  Final  CULTURE, BLOOD (ROUTINE X 2) w Reflex to ID Panel     Status: Abnormal   Collection Time: 04/17/21 11:58 AM   Specimen: BLOOD  Result Value Ref Range Status   Specimen Description   Final    BLOOD BLOOD RIGHT HAND Performed at [facility], [address], [place], [place] [address]    Special Requests   Final    BOTTLES DRAWN AEROBIC  AND ANAEROBIC Blood Culture adequate volume Performed at [facility], [address] [place], [place] [address]    Culture  Setup Time   Final    GRAM POSITIVE COCCI IN BOTH AEROBIC AND ANAEROBIC BOTTLES CRITICAL RESULT CALLED TO, READ BACK BY AND VERIFIED WITH: JASON ROBBINS ON 04/18/21 AT 0938 QSD Performed at [facility], [address], [place], [place] [address]    Culture STREPTOCOCCUS GROUP G (A)  Final   Report Status 04/20/2021 FINAL  Final   Organism ID, Bacteria STREPTOCOCCUS GROUP G  Final      Susceptibility   Streptococcus group g - MIC*    CLINDAMYCIN RESISTANT Resistant     AMPICILLIN <=0.25 SENSITIVE Sensitive     VANCOMYCIN 0.5 SENSITIVE Sensitive     CEFTRIAXONE <=0.12 SENSITIVE Sensitive     LEVOFLOXACIN 0.5 SENSITIVE Sensitive     PENICILLIN Value in next row Sensitive      SENSITIVE<=0.06    * STREPTOCOCCUS GROUP G  Blood Culture ID Panel (Reflexed)     Status: Abnormal   Collection Time: 04/17/21 11:58 AM   Result Value Ref Range Status   Enterococcus faecalis NOT DETECTED NOT DETECTED Final   Enterococcus Faecium NOT DETECTED NOT DETECTED Final   Listeria monocytogenes NOT DETECTED NOT DETECTED Final   Staphylococcus species NOT DETECTED NOT DETECTED Final   Staphylococcus aureus (BCID) NOT DETECTED NOT DETECTED Final   Staphylococcus epidermidis NOT DETECTED NOT DETECTED Final   Staphylococcus lugdunensis NOT DETECTED NOT DETECTED Final   Streptococcus species DETECTED (A) NOT DETECTED Final    Comment: Not Enterococcus species, Streptococcus agalactiae, Streptococcus pyogenes, or Streptococcus pneumoniae. CRITICAL RESULT CALLED TO, READ BACK BY AND VERIFIED WITH: JASON ROBBINS ON 04/18/21 AT 0035 QSD    Streptococcus agalactiae NOT DETECTED NOT DETECTED Final   Streptococcus pneumoniae NOT DETECTED NOT DETECTED Final   Streptococcus pyogenes NOT DETECTED NOT DETECTED Final   A.calcoaceticus-baumannii NOT DETECTED NOT DETECTED Final   Bacteroides fragilis NOT DETECTED NOT DETECTED Final   Enterobacterales NOT DETECTED NOT DETECTED Final   Enterobacter cloacae complex NOT DETECTED NOT DETECTED Final   Escherichia coli NOT DETECTED NOT DETECTED Final   Klebsiella aerogenes NOT DETECTED NOT DETECTED Final   Klebsiella oxytoca NOT DETECTED NOT DETECTED Final   Klebsiella pneumoniae NOT DETECTED NOT DETECTED Final   Proteus species NOT DETECTED NOT DETECTED Final   Salmonella species NOT DETECTED NOT DETECTED Final   Serratia marcescens NOT DETECTED NOT DETECTED Final   Haemophilus influenzae NOT DETECTED NOT DETECTED Final   Neisseria meningitidis NOT DETECTED NOT DETECTED Final   Pseudomonas aeruginosa NOT DETECTED NOT DETECTED Final   Stenotrophomonas maltophilia NOT DETECTED NOT DETECTED Final   Candida albicans NOT DETECTED NOT DETECTED Final   Candida auris NOT DETECTED NOT DETECTED Final   Candida glabrata NOT DETECTED NOT DETECTED Final   Candida krusei NOT DETECTED NOT DETECTED  Final   Candida parapsilosis NOT DETECTED NOT DETECTED Final   Candida tropicalis NOT DETECTED NOT DETECTED Final   Cryptococcus neoformans/gattii NOT DETECTED NOT DETECTED Final    Comment: Performed at [facility], [address] [place], [place] [address]  MRSA PCR Screening     Status: None   Collection Time: 04/17/21  3:46 PM   Specimen: Nasal Mucosa; Nasopharyngeal  Result Value Ref Range Status   MRSA by PCR NEGATIVE NEGATIVE Final    Comment:        The GeneXpert MRSA Assay (FDA approved for  NASAL specimens only), is one component of a comprehensive MRSA colonization surveillance program. It is not intended to diagnose MRSA infection nor to guide or monitor treatment for MRSA infections. Performed at [facility], [address] [place], [place] [address]   Culture, blood (single) w Reflex to ID Panel     Status: None (Preliminary result)   Collection Time: 04/20/21  3:37 AM   Specimen: BLOOD  Result Value Ref Range Status   Specimen Description BLOOD LEFT HAND  Final   Special Requests   Final    BOTTLES DRAWN AEROBIC AND ANAEROBIC Blood Culture adequate volume   Culture   Final    NO GROWTH < 12 HOURS Performed at [facility], [place] [place], [place] [address]    Report Status PENDING  Incomplete    Coagulation Studies: No results for input(s): LABPROT, INR in the last 72 hours.  Urinalysis: No results for input(s): COLORURINE, LABSPEC, PHURINE, GLUCOSEU, HGBUR, BILIRUBINUR, KETONESUR, PROTEINUR, UROBILINOGEN, NITRITE, LEUKOCYTESUR in the last 72 hours.  Invalid input(s): APPERANCEUR    Imaging: ECHOCARDIOGRAM COMPLETE  Result Date: 04/20/2021    ECHOCARDIOGRAM REPORT   Patient Name:   Kirsten Mcmahon Date of Exam: 04/20/2021 Medical Rec #:  [id number]         Height:       62.0 in Accession #:    [id number]        Weight:       128.2 lb Date of Birth:  10/30/1946          BSA:          1.582 m Patient Age:    66  years          BP:           148/75 mmHg Patient Gender: F                 HR:           82 bpm. Exam Location:  [facility] Procedure: 2D Echo, Cardiac Doppler and Color Doppler Indications:     Bacteremia R78.81  History:         Patient has prior history of Echocardiogram examinations, most                  recent 07/02/2019. Stroke; Risk Factors:Hypertension.  Sonographer:     Sherrie Sport RDCS (AE) Referring Phys:  [place] Diagnosing Phys: Kathlyn Sacramento MD  Sonographer Comments: Suboptimal apical window. IMPRESSIONS  1. Left ventricular ejection fraction, by estimation, is 55 to 60%. The left ventricle has normal function. The left ventricle has no regional wall motion abnormalities. There is mild left ventricular hypertrophy. Left ventricular diastolic parameters are consistent with Grade I diastolic dysfunction (impaired relaxation).  2. Right ventricular systolic function is normal. The right ventricular size is normal. Tricuspid regurgitation signal is inadequate for assessing PA pressure.  3. Left atrial size was mildly dilated.  4. The mitral valve is normal in structure. No evidence of mitral valve regurgitation. No evidence of mitral stenosis.  5. The aortic valve is normal in structure. Aortic valve regurgitation is trivial. No aortic stenosis is present.  6. The inferior vena cava is normal in size with greater than 50% respiratory variability, suggesting right atrial pressure of 3 mmHg. FINDINGS  Left Ventricle: Left ventricular ejection fraction, by estimation, is 55 to 60%. The left ventricle has normal function. The left ventricle has no regional wall motion abnormalities. The left ventricular  internal cavity size was normal in size. There is  mild left ventricular hypertrophy. Left ventricular diastolic parameters are consistent with Grade I diastolic dysfunction (impaired relaxation). Right Ventricle: The right ventricular size is normal. No increase in right ventricular wall thickness.  Right ventricular systolic function is normal. Tricuspid regurgitation signal is inadequate for assessing PA pressure. Left Atrium: Left atrial size was mildly dilated. Right Atrium: Right atrial size was normal in size. Pericardium: There is no evidence of pericardial effusion. Mitral Valve: The mitral valve is normal in structure. No evidence of mitral valve regurgitation. No evidence of mitral valve stenosis. Tricuspid Valve: The tricuspid valve is normal in structure. Tricuspid valve regurgitation is not demonstrated. No evidence of tricuspid stenosis. Aortic Valve: The aortic valve is normal in structure. Aortic valve regurgitation is trivial. No aortic stenosis is present. Aortic valve mean gradient measures 4.3 mmHg. Aortic valve peak gradient measures 8.0 mmHg. Aortic valve area, by VTI measures 2.04 cm. Pulmonic Valve: The pulmonic valve was normal in structure. Pulmonic valve regurgitation is not visualized. No evidence of pulmonic stenosis. Aorta: The aortic root is normal in size and structure. Venous: The inferior vena cava is normal in size with greater than 50% respiratory variability, suggesting right atrial pressure of 3 mmHg. IAS/Shunts: No atrial level shunt detected by color flow Doppler.  LEFT VENTRICLE PLAX 2D LVIDd:         3.72 cm  Diastology LVIDs:         2.63 cm  LV e' medial:    5.98 cm/s LV PW:         1.76 cm  LV E/e' medial:  10.0 LV IVS:        0.93 cm  LV e' lateral:   4.68 cm/s LVOT diam:     2.10 cm  LV E/e' lateral: 12.8 LV SV:         42 LV SV Index:   27 LVOT Area:     3.46 cm  RIGHT VENTRICLE RV Basal diam:  1.92 cm RV S prime:     12.10 cm/s TAPSE (M-mode): 3.0 cm LEFT ATRIUM             Index       RIGHT ATRIUM           Index LA diam:        3.00 cm 1.90 cm/m  RA Area:     11.70 cm LA Vol (A2C):   61.0 ml 38.55 ml/m RA Volume:   24.70 ml  15.61 ml/m LA Vol (A4C):   50.8 ml 32.10 ml/m LA Biplane Vol: 56.7 ml 35.83 ml/m  AORTIC VALVE                   PULMONIC VALVE AV  Area (Vmax):    1.75 cm    PV Vmax:        0.87 m/s AV Area (Vmean):   1.78 cm    PV Peak grad:   3.1 mmHg AV Area (VTI):     2.04 cm    RVOT Peak grad: 5 mmHg AV Vmax:           141.00 cm/s AV Vmean:          96.833 cm/s AV VTI:            0.207 m AV Peak Grad:      8.0 mmHg AV Mean Grad:      4.3 mmHg LVOT Vmax:  71.10 cm/s LVOT Vmean:        49.700 cm/s LVOT VTI:          0.122 m LVOT/AV VTI ratio: 0.59  AORTA Ao Root diam: 3.20 cm MITRAL VALVE               TRICUSPID VALVE MV Area (PHT): 4.93 cm    TR Peak grad:   14.4 mmHg MV Decel Time: 154 msec    TR Vmax:        190.00 cm/s MV E velocity: 60.00 cm/s MV A velocity: 98.50 cm/s  SHUNTS MV E/A ratio:  0.61        Systemic VTI:  0.12 m                            Systemic Diam: 2.10 cm Kathlyn Sacramento MD Electronically signed by Kathlyn Sacramento MD Signature Date/Time: 04/20/2021/11:46:57 AM    Final      Medications:   . sodium chloride 250 mL (04/21/21 0529)  . ampicillin (OMNIPEN) IV 2 g (04/21/21 0530)   . amLODipine  10 mg Oral Daily  . aspirin  81 mg Oral Daily  . Chlorhexidine Gluconate Cloth  6 each Topical Q0600  . cholecalciferol  1,000 Units Oral Daily  . cloNIDine  0.3 mg Transdermal Weekly  . [START ON 04/22/2021] epoetin (EPOGEN/PROCRIT) injection  4,000 Units Intravenous Q M,W,F-HD  . feeding supplement (NEPRO CARB STEADY)  237 mL Oral TID BM  . heparin  5,000 Units Subcutaneous Q8H  . latanoprost  1 drop Both Eyes QHS  . letrozole  2.5 mg Oral Daily  . levETIRAcetam  500 mg Oral Daily  . levothyroxine  112 mcg Oral Q0600  . metoprolol succinate  50 mg Oral Daily  . multivitamin  1 tablet Oral QHS   sodium chloride, acetaminophen **OR** acetaminophen, calcium carbonate, camphor-menthol **AND** hydrOXYzine, docusate sodium, hydrALAZINE, LORazepam, ondansetron **OR** ondansetron (ZOFRAN) IV, sorbitol  Assessment/ Plan:  [name] is a 75 y.o.  female  with past medical history of GERD, Hypertension,  diabetes, hyperlipidemia, CVA and CKD stage 4. She presents to the ED with increased lower extremity edema. She is known to our team from previous admissions.   1. End Stage Renal Disease requiring new start hemodialysis:  First dialysis treatment 5/6.   - Received dialysis yesterday - Tolerated well, UF 1.5L removed - Next treatment scheduled for Wednesday - We discussed with son the plan of care at this time depends on blood culture results. We reviewed each outcome and son states his understands we are waiting on lab results to determine the next steps in care. - Outpatient planning: Dialysis liaison notified of outpatient center acceptance at [facility], MWF, [phone]. All parties aware   2. Anemia of chronic kidney disease  Lab Results  Component Value Date   HGB 8.6 (L) 04/20/2021   EPO with treatments   3. Secondary Hyperparathyroidism: with hypocalcemia   Lab Results  Component Value Date   PTH 161 (H) 01/18/2021   CALCIUM 8.2 (L) 04/20/2021   PHOS 2.2 (L) 04/20/2021  Phosphorus below target Oral intake improving  4.Diabetes mellitus type II with chronic kidney disease noninsulin dependent.  hemoglobin A1c 5.7 on 01/18/21.  Stable   5. Hypertension: Home regimen of clonidine, amlodipine, metoprolol continued Stable at this time   LOS: 12   5/17/202210:16 AM

## 2021-04-21 NOTE — Progress Notes (Signed)
PT Cancellation Note  Patient Details Name: Kirsten Mcmahon MRN: [id number] DOB: 1946-07-04   Cancelled Treatment:    Reason Eval/Treat Not Completed: Fatigue/lethargy limiting ability to participate (Patient sleeping soundly upon re-attempt at treatment session.  Opens eyes briefly with max verbal/tactile cuing, but quickly closes back. Unable to maintain alertness for participation with session.  Will continue efforts at later time/date.)  Shirrell Solinger H. Owens Shark, PT, DPT, NCS 04/21/21, 1:51 PM [phone]

## 2021-04-21 NOTE — Evaluation (Signed)
Clinical/Bedside Swallow Evaluation Patient Details  Name: Kirsten Mcmahon MRN: [id number] Date of Birth: 1946-08-23  Today's Date: 04/21/2021 Time: SLP Start Time (ACUTE ONLY): 1545 SLP Stop Time (ACUTE ONLY): 1640 SLP Time Calculation (min) (ACUTE ONLY): 55 min  Past Medical History:  Past Medical History:  Diagnosis Date  . Cancer [facility]) 2017   Right breast  . Depression   . GERD (gastroesophageal reflux disease)   . Glaucoma   . Hemiparesis ([place])    left side  . High cholesterol   . History of seizure    x 1 - after a spider bite  . History of stroke with residual deficit    left-side weakness  . Hypertension    states BP under control with meds., has been on med. x 2 yr.  . Hypothyroidism   . Non-insulin dependent type 2 diabetes mellitus ([place])   . Overactive bladder   . Stroke [facility])    1998 weakness on left side   Past Surgical History:  Past Surgical History:  Procedure Laterality Date  . ABDOMINAL HYSTERECTOMY     complete  . BREAST BIOPSY Left 08/03/2018   Benign adipose tissue  . BREAST EXCISIONAL BIOPSY Right 2014   Positive  . BREAST LUMPECTOMY Right   . CATARACT EXTRACTION W/ INTRAOCULAR LENS IMPLANT Left   . CEREBRAL ANEURYSM REPAIR  1998  . DIALYSIS/PERMA CATHETER INSERTION N/A 04/10/2021   Procedure: DIALYSIS/PERMA CATHETER INSERTION;  Surgeon: Algernon Huxley, MD;  Location: [facility];  Service: Cardiovascular;  Laterality: N/A;  . PICC LINE INSERTION    . THYROID LOBECTOMY Right 07/15/2017   Procedure: RIGHT THYROID LOBECTOMY;  Surgeon: Armandina Gemma, MD;  Location: [facility];  Service: General;  Laterality: Right;   HPI:  Pt is a 75 y.o. female with medical history significant of chronic kidney disease stage IV, GERD, hypertension, hypothyroidism, non-insulin-dependent diabetes, hyperlipidemia, previous CVA and overactive bladder who has been seeing nephrology but not yet ready for hemodialysis.  Unsure of Baseline Cognitive status. Parial  independence w/ ADLs per last visit note.  She noticed worsening lower extremities edema left more than right.  This has been going on for a number of days.  She went to her PCP where she was placed on increased dose of diuretics.  Swelling did not improve but her renal function appears to have worsened today.  She is seen in the ER with a creatinine of 7.99.  Patient also has no significant shortness of breath.  Patient being admitted with acute on chronic kidney injury.  Head CT: "chronic cortical/subcortical infarct within the right  frontal operculum and right insula. Associated wallerian  degeneration affecting the right brainstem.     Redemonstrated chronic lacunar infarcts within the deep gray nuclei  bilaterally.     Background advanced patchy and ill-defined hypoattenuation within  the cerebral white matter, nonspecific but compatible with chronic  small vessel ischemic disease.     Redemonstrated small chronic infarcts within the bilateral  cerebellar hemispheres.     There is no acute intracranial hemorrhage.".  CXR: "No active disease".   Assessment / Plan / Recommendation Clinical Impression  Pt appears to present w/ grossly adequate oropharyngeal phase swallow w/ No pharyngeal phase dysphagia noted w/ trials, No neuromuscular deficits noted. Pt is missing Dentition which impacts the oral phase and effective mastication of solid foods. Pt consumed po trials w/ No overt, clinical s/s of aspiration during po trials. Pt appears at reduced risk for aspiration following  general aspiration precautions and w/ a modified food consistency diet(Minced foods). During po trials, pt consumed all consistencies w/ no overt coughing, decline in vocal quality, or change in respiratory presentation during/post trials. Oral phase appeared [facility] w/ timely bolus management and control of bolus propulsion for A-P transfer for swallowing. Min increased oral phase time for full mastication of increased textured solid  foods(minced-mech soft consistency); full oral clearing achieved w/ all trial consistencies. OM Exam appeared [facility] w/ no unilateral weakness noted. Speech Clear. Pt fed self w/ setup support -- she liked to hold cup and utensil to feed self. LUE weakness baseline so support given w/ prep. Recommend a MINCED consistency diet w/ well-moistened foods; Thin liquids. This was the diet consistency ordered last admit in 01/2021. Recommend general aspiration precautions, Pills in Puree for safer, easier swallowing. Education given on food consistencies and easy to eat options; general aspiration precautions. Dinner meal ordered w/ NSG to monitor pt at meal tonight for safety w/ new diet consistency. NSG agreed. SLP Visit Diagnosis: Dysphagia, oral phase (R13.11) (missing dentition)    Aspiration Risk   (reduced following precautions)    Diet Recommendation  Dysphagia level 2 (MINCED foods w/ Gravies to moisten); thin liquids. General aspiration and REFLUX precautions. Support at meals d/t LUE weakness baseline - but pt likes to hold the utensil and cup herself to feed self.   Medication Administration: Whole meds with puree (as needed for ease of swallowing)    Other  Recommendations Recommended Consults:  (Dietician following) Oral Care Recommendations: Oral care BID;Oral care before and after PO;Staff/trained caregiver to provide oral care Other Recommendations:  (n./a)   Follow up Recommendations None      Frequency and Duration min 1 x/week  1 week       Prognosis Prognosis for Safe Diet Advancement: Fair (-Good) Barriers to Reach Goals: Time post onset;Severity of deficits      Swallow Study   General Date of Onset: 04/09/21 HPI: Pt is a 75 y.o. female with medical history significant of chronic kidney disease stage IV, GERD, hypertension, hypothyroidism, non-insulin-dependent diabetes, hyperlipidemia, previous CVA and overactive bladder who has been seeing nephrology but not yet ready for  hemodialysis.  Unsure of Baseline Cognitive status. Parial independence w/ ADLs per last visit note.  She noticed worsening lower extremities edema left more than right.  This has been going on for a number of days.  She went to her PCP where she was placed on increased dose of diuretics.  Swelling did not improve but her renal function appears to have worsened today.  She is seen in the ER with a creatinine of 7.99.  Patient also has no significant shortness of breath.  Patient being admitted with acute on chronic kidney injury.  Head CT: "chronic cortical/subcortical infarct within the right  frontal operculum and right insula. Associated wallerian  degeneration affecting the right brainstem.     Redemonstrated chronic lacunar infarcts within the deep gray nuclei  bilaterally.     Background advanced patchy and ill-defined hypoattenuation within  the cerebral white matter, nonspecific but compatible with chronic  small vessel ischemic disease.     Redemonstrated small chronic infarcts within the bilateral  cerebellar hemispheres.     There is no acute intracranial hemorrhage.".  CXR: "No active disease". Type of Study: Bedside Swallow Evaluation Previous Swallow Assessment: 01/2021 Diet Prior to this Study: Dysphagia 2 (chopped);Thin liquids (at d/c previously but currently on a dysphagia level 1(puree)) Temperature Spikes Noted: No (  wbc 8.2) Respiratory Status: Room air History of Recent Intubation: No Behavior/Cognition: Alert;Cooperative;Pleasant mood;Distractible;Requires cueing Oral Cavity Assessment: Within Functional Limits Oral Care Completed by SLP: Recent completion by staff Oral Cavity - Dentition: Missing dentition Vision: Functional for self-feeding Self-Feeding Abilities: Able to feed self;Needs assist;Needs set up (she liked to hold the spoon herself) Patient Positioning: Upright in bed (needed positioning upright in bed) Baseline Vocal Quality: Normal Volitional Cough:  Strong Volitional Swallow: Able to elicit    Oral/Motor/Sensory Function Overall Oral Motor/Sensory Function: Within functional limits   Ice Chips Ice chips: Not tested   Thin Liquid Thin Liquid: Within functional limits Presentation: Self Fed;Straw (10 sips) Other Comments: pt fed self    Nectar Thick Nectar Thick Liquid: Not tested   Honey Thick Honey Thick Liquid: Not tested   Puree Puree: Within functional limits Presentation: Spoon;Self Fed (5 trials)   Solid     Solid: Impaired Presentation: Self Fed;Spoon (8 trials) Oral Phase Impairments: Impaired mastication (missing dentition) Oral Phase Functional Implications:  (missing dentition) Pharyngeal Phase Impairments:  (none) Other Comments: time needed for full mastication, full clearing       Orinda Kenner, MS, [employer] Speech Language Pathologist [facility] [phone] Teandra Harlan 04/21/2021,4:49 PM

## 2021-04-21 NOTE — Progress Notes (Addendum)
Progress Note    RADIANCE DEADY  QPY:[id number] DOB: 29-Nov-1946  DOA: 04/09/2021 PCP: [place]      Brief Narrative:    Medical records reviewed and are as summarized below:  Kirsten Mcmahon is a 74 y.o. female with medical history significant for CKD stage V, hypertension, hypothyroidism, diabetes mellitus, hyperlipidemia, history of stroke, seizure disorder, overactive bladder.  She presented to the hospital because of increased swelling of the lower extremities despite taking diuretics.  In the emergency room, she was noted to have a creatinine of 7.99 which was much higher than her previous creatinine of 3.99 (2 months prior).  She was admitted to the hospital for AKI on CKD stage V.  She was evaluated by the nephrologist.  Vascular surgeon was consulted and a right IJ tunneled hemodialysis permacath was placed on 04/10/2021.  Hemodialysis was initiated on 04/10/2021.  She developed streptococcal sepsis secondary to strep group G bacteremia.  ID was consulted to assist with management.  Cardiologist has been consulted for TEE.      Assessment/Plan:   Principal Problem:   Acute renal failure superimposed on stage 3b chronic kidney disease (HCC) Active Problems:   Hypertension   Hypothyroidism   GERD (gastroesophageal reflux disease)   Anemia in chronic kidney disease (CKD)   Type II diabetes mellitus with renal manifestations (Kirsten Mcmahon)   ESRD needing dialysis (Kirsten Mcmahon)   Leukocytosis   Fever   Streptococcal sepsis, unspecified (HCC)   Streptococcal bacteremia   Body mass index is 22.68 kg/m.     ESRD: Hemodialysis was initiated on 04/10/2021.  She will continue hemodialysis as an outpatient.  She has an outpatient hemodialysis chair at [facility] on Mondays, Wednesdays and Fridays.   Streptococcal sepsis and bacteremia: Leukocytosis has resolved.  Continue IV ceftriaxone.  No evidence of vegetation on 2D echo.  Consulted cardiologist for TEE.   Follow-up with ID for further recommendations.  Confusion, suspected metabolic encephalopathy/delirium, underlying cognitive impairment: Improving.  Probably close to her baseline.  Hypokalemia and hypophosphatemia: Improved  Left lower extremity edema: No evidence of DVT.  Anemia of chronic disease: S/p transfusion with 1 unit of PRBCs on 04/11/2021.  Seizure disorder: Continue Keppra  Hypertension: Continue clonidine patch and other antihypertensives as able  Type II DM: NovoLog as needed for hyperglycemia.  Dysphagia: Consult speech therapist for swallow evaluation.  CAD, history of stroke with left-sided weakness: Continue aspirin  Plan of care was discussed with her son at the bedside    Diet Order            DIET - DYS 1 Room service appropriate? Yes; Fluid consistency: Thin  Diet effective now                    Consultants:  Vascular surgeon  Nephrologist  Infectious disease  Cardiologist  Procedures:  Right IJ permacath placed on 04/10/2021    Medications:   . amLODipine  10 mg Oral Daily  . aspirin  81 mg Oral Daily  . Chlorhexidine Gluconate Cloth  6 each Topical Q0600  . cholecalciferol  1,000 Units Oral Daily  . cloNIDine  0.3 mg Transdermal Weekly  . [START ON 04/22/2021] epoetin (EPOGEN/PROCRIT) injection  4,000 Units Intravenous Q M,W,F-HD  . feeding supplement (NEPRO CARB STEADY)  237 mL Oral TID BM  . heparin  5,000 Units Subcutaneous Q8H  . latanoprost  1 drop Both Eyes QHS  . letrozole  2.5 mg Oral  Daily  . levETIRAcetam  500 mg Oral Daily  . levothyroxine  112 mcg Oral Q0600  . metoprolol succinate  50 mg Oral Daily  . multivitamin  1 tablet Oral QHS   Continuous Infusions: . sodium chloride 250 mL (04/21/21 0529)  . ampicillin (OMNIPEN) IV 2 g (04/21/21 0530)     Anti-infectives (From admission, onward)   Start     Dose/Rate Route Frequency Ordered Stop   04/21/21 0400  ampicillin (OMNIPEN) 2 g in sodium chloride 0.9 % 100  mL IVPB        2 g 300 mL/hr over 20 Minutes Intravenous Every 12 hours 04/20/21 1616     04/18/21 1200  vancomycin (VANCOREADY) IVPB 750 mg/150 mL  Status:  Discontinued        750 mg 150 mL/hr over 60 Minutes Intravenous Every T-Th-Sa (Hemodialysis) 04/17/21 1436 04/18/21 0045   04/18/21 0500  cefTRIAXone (ROCEPHIN) 2 g in sodium chloride 0.9 % 100 mL IVPB  Status:  Discontinued        2 g 200 mL/hr over 30 Minutes Intravenous Every 24 hours 04/18/21 0047 04/20/21 1616   04/17/21 1600  vancomycin (VANCOREADY) IVPB 1500 mg/300 mL        1,500 mg 150 mL/hr over 120 Minutes Intravenous  Once 04/17/21 1433 04/17/21 1832   04/17/21 1600  meropenem (MERREM) 500 mg in sodium chloride 0.9 % 100 mL IVPB  Status:  Discontinued        500 mg 200 mL/hr over 30 Minutes Intravenous Daily-1800 04/17/21 1505 04/18/21 0044   04/17/21 1500  cefTRIAXone (ROCEPHIN) 2 g in sodium chloride 0.9 % 100 mL IVPB  Status:  Discontinued       Note to Pharmacy: Antibiotics to be given after urine has been collected for urinalysis   2 g 200 mL/hr over 30 Minutes Intravenous Every 24 hours 04/17/21 1416 04/17/21 1500   04/11/21 0600  ceFAZolin (ANCEF) 1 g in sodium chloride 0.9 % 100 mL IVPB       Note to Pharmacy: To be given in specials   1 g 200 mL/hr over 30 Minutes Intravenous On call to O.R. 04/10/21 1432 04/10/21 1546             Family Communication/Anticipated D/C date and plan/Code Status   DVT prophylaxis: heparin injection 5,000 Units Start: 04/09/21 2200     Code Status: Full Code  Family Communication:  Kirsten Mcmahon, son Disposition Plan:    Status is: Inpatient  Remains inpatient appropriate because:IV treatments appropriate due to intensity of illness or inability to take PO and Inpatient level of care appropriate due to severity of illness   Dispo: The patient is from: Home              Anticipated d/c is to: Home              Patient currently is not medically stable to d/c.    Difficult to place patient No           Subjective:   Interval events noted.  She has no complaints.  Her son, Kirsten Mcmahon, was at the bedside.  Objective:    Vitals:   04/21/21 0507 04/21/21 0856 04/21/21 0901 04/21/21 1135  BP: 113/60 110/68  111/73  Pulse: 87 79 83 79  Resp: 18 18  18   Temp: 97.9 F (36.6 C) 97.9 F (36.6 C)  97.8 F (36.6 C)  TempSrc: Oral     SpO2: 100%  100% 100%  Weight:      Height:       No data found.   Intake/Output Summary (Last 24 hours) at 04/21/2021 1243 Last data filed at 04/20/2021 2228 Gross per 24 hour  Intake 0 ml  Output 1500 ml  Net -1500 ml   Filed Weights   04/20/21 0500 04/20/21 0601 04/21/21 0500  Weight: 59.3 kg 58.2 kg 56.2 kg    Exam:   GEN: NAD SKIN: Warm and dry.  Permacath on the right upper chest wall. EYES: No pallor or icterus ENT: MMM CV: RRR PULM: CTA B ABD: soft, ND, NT, +BS CNS: AAO x 2 (person and place), left-sided weakness. EXT: No edema or tenderness    Data Reviewed:   I have personally reviewed following labs and imaging studies:  Labs: Labs show the following:   Basic Metabolic Panel: Recent Labs  Lab 04/15/21 1007 04/16/21 1114 04/16/21 1210 04/17/21 0415 04/18/21 0408 04/19/21 0400 04/20/21 0337  NA 137  --  137 138 138 139 136  K 3.4*  --  3.1* 3.5 3.6 3.6 3.6  CL 100  --  96* 100 100 100 97*  CO2 27  --  27 26 28 27 28   GLUCOSE 125*  --  119* 123* 185* 156* 157*  BUN 11  --  <5* 12 28* 24* 39*  CREATININE 3.00*  --  1.17* 3.08* 4.89* 3.64* 5.26*  CALCIUM 7.9*  --  8.6* 8.3* 8.1* 8.3* 8.2*  MG 1.8 1.8  --   --  1.9  --  2.0  PHOS  --   --  1.4* 3.9 3.2  --  2.2*   GFR Estimated Creatinine Clearance: 7.2 mL/min (A) (by C-G formula based on SCr of 5.26 mg/dL (H)). Liver Function Tests: Recent Labs  Lab 04/16/21 1210  ALBUMIN 3.2*   No results for input(s): LIPASE, AMYLASE in the last 168 hours. No results for input(s): AMMONIA in the last 168 hours. Coagulation  profile No results for input(s): INR, PROTIME in the last 168 hours.  CBC: Recent Labs  Lab 04/15/21 1007 04/16/21 1212 04/17/21 0415 04/18/21 0408 04/19/21 0400 04/20/21 0337  WBC 11.2* 18.2* 29.3* 17.2* 13.6* 8.2  NEUTROABS 8.5*  --  26.6* 15.3* 11.2* 5.8  HGB 9.1* 9.6* 8.5* 7.9* 8.0* 8.6*  HCT 27.0* 29.4* 26.7* 24.7* 25.4* 26.5*  MCV 88.2 88.3 92.1 92.5 92.0 90.8  PLT 206 257 226 209 230 268   Cardiac Enzymes: No results for input(s): CKTOTAL, CKMB, CKMBINDEX, TROPONINI in the last 168 hours. BNP (last 3 results) No results for input(s): PROBNP in the last 8760 hours. CBG: Recent Labs  Lab 04/14/21 2144  GLUCAP 95   D-Dimer: No results for input(s): DDIMER in the last 72 hours. Hgb A1c: No results for input(s): HGBA1C in the last 72 hours. Lipid Profile: No results for input(s): CHOL, HDL, LDLCALC, TRIG, CHOLHDL, LDLDIRECT in the last 72 hours. Thyroid function studies: No results for input(s): TSH, T4TOTAL, T3FREE, THYROIDAB in the last 72 hours.  Invalid input(s): FREET3 Anemia work up: No results for input(s): VITAMINB12, FOLATE, FERRITIN, TIBC, IRON, RETICCTPCT in the last 72 hours. Sepsis Labs: Recent Labs  Lab 04/17/21 0415 04/17/21 1158 04/17/21 1354 04/18/21 0408 04/19/21 0400 04/20/21 0337  PROCALCITON  --  1.88  --  5.20 6.03  --   WBC 29.3*  --   --  17.2* 13.6* 8.2  LATICACIDVEN  --  1.3 1.2  --   --   --  Microbiology Recent Results (from the past 240 hour(s))  CULTURE, BLOOD (ROUTINE X 2) w Reflex to ID Panel     Status: Abnormal   Collection Time: 04/17/21 11:58 AM   Specimen: BLOOD  Result Value Ref Range Status   Specimen Description   Final    BLOOD BLOOD RIGHT ARM Performed at [facility], [address], [place], [place] [address]    Special Requests   Final    BOTTLES DRAWN AEROBIC AND ANAEROBIC Blood Culture adequate volume Performed at [facility], [address], [place], [place] [address]     Culture  Setup Time   Final    GRAM POSITIVE COCCI IN BOTH AEROBIC AND ANAEROBIC BOTTLES CRITICAL RESULT CALLED TO, READ BACK BY AND VERIFIED WITH: JASON ROBBINS ON 04/18/21 AT 0035 QSD Performed at [facility], [address] [place], [place] [address]    Culture (A)  Final    STREPTOCOCCUS GROUP G SUSCEPTIBILITIES PERFORMED ON PREVIOUS CULTURE WITHIN THE LAST 5 DAYS. Performed at [facility], [place] [address], [place], [place] [address]    Report Status 04/20/2021 FINAL  Final  CULTURE, BLOOD (ROUTINE X 2) w Reflex to ID Panel     Status: Abnormal   Collection Time: 04/17/21 11:58 AM   Specimen: BLOOD  Result Value Ref Range Status   Specimen Description   Final    BLOOD BLOOD RIGHT HAND Performed at [facility], [address], [place], [place] [address]    Special Requests   Final    BOTTLES DRAWN AEROBIC AND ANAEROBIC Blood Culture adequate volume Performed at [facility], [address] [place], [place] [address]    Culture  Setup Time   Final    GRAM POSITIVE COCCI IN BOTH AEROBIC AND ANAEROBIC BOTTLES CRITICAL RESULT CALLED TO, READ BACK BY AND VERIFIED WITH: JASON ROBBINS ON 04/18/21 AT 0035 QSD Performed at [facility], [place] [address], [place], [place] [address]    Culture STREPTOCOCCUS GROUP G (A)  Final   Report Status 04/20/2021 FINAL  Final   Organism ID, Bacteria STREPTOCOCCUS GROUP G  Final      Susceptibility   Streptococcus group g - MIC*    CLINDAMYCIN RESISTANT Resistant     AMPICILLIN <=0.25 SENSITIVE Sensitive     VANCOMYCIN 0.5 SENSITIVE Sensitive     CEFTRIAXONE <=0.12 SENSITIVE Sensitive     LEVOFLOXACIN 0.5 SENSITIVE Sensitive     PENICILLIN Value in next row Sensitive      SENSITIVE<=0.06    * STREPTOCOCCUS GROUP G  Blood Culture ID Panel (Reflexed)     Status: Abnormal   Collection Time: 04/17/21 11:58 AM  Result Value Ref Range Status   Enterococcus faecalis NOT DETECTED NOT DETECTED Final    Enterococcus Faecium NOT DETECTED NOT DETECTED Final   Listeria monocytogenes NOT DETECTED NOT DETECTED Final   Staphylococcus species NOT DETECTED NOT DETECTED Final   Staphylococcus aureus (BCID) NOT DETECTED NOT DETECTED Final   Staphylococcus epidermidis NOT DETECTED NOT DETECTED Final   Staphylococcus lugdunensis NOT DETECTED NOT DETECTED Final   Streptococcus species DETECTED (A) NOT DETECTED Final    Comment: Not Enterococcus species, Streptococcus agalactiae, Streptococcus pyogenes, or Streptococcus pneumoniae. CRITICAL RESULT CALLED TO, READ BACK BY AND VERIFIED WITH: JASON ROBBINS ON 04/18/21 AT 0035 QSD    Streptococcus agalactiae NOT DETECTED NOT DETECTED Final   Streptococcus pneumoniae NOT DETECTED NOT DETECTED Final   Streptococcus pyogenes NOT DETECTED NOT DETECTED  Final   A.calcoaceticus-baumannii NOT DETECTED NOT DETECTED Final   Bacteroides fragilis NOT DETECTED NOT DETECTED Final   Enterobacterales NOT DETECTED NOT DETECTED Final   Enterobacter cloacae complex NOT DETECTED NOT DETECTED Final   Escherichia coli NOT DETECTED NOT DETECTED Final   Klebsiella aerogenes NOT DETECTED NOT DETECTED Final   Klebsiella oxytoca NOT DETECTED NOT DETECTED Final   Klebsiella pneumoniae NOT DETECTED NOT DETECTED Final   Proteus species NOT DETECTED NOT DETECTED Final   Salmonella species NOT DETECTED NOT DETECTED Final   Serratia marcescens NOT DETECTED NOT DETECTED Final   Haemophilus influenzae NOT DETECTED NOT DETECTED Final   Neisseria meningitidis NOT DETECTED NOT DETECTED Final   Pseudomonas aeruginosa NOT DETECTED NOT DETECTED Final   Stenotrophomonas maltophilia NOT DETECTED NOT DETECTED Final   Candida albicans NOT DETECTED NOT DETECTED Final   Candida auris NOT DETECTED NOT DETECTED Final   Candida glabrata NOT DETECTED NOT DETECTED Final   Candida krusei NOT DETECTED NOT DETECTED Final   Candida parapsilosis NOT DETECTED NOT DETECTED Final   Candida tropicalis NOT  DETECTED NOT DETECTED Final   Cryptococcus neoformans/gattii NOT DETECTED NOT DETECTED Final    Comment: Performed at [facility], [address] [place], [place] [address]  MRSA PCR Screening     Status: None   Collection Time: 04/17/21  3:46 PM   Specimen: Nasal Mucosa; Nasopharyngeal  Result Value Ref Range Status   MRSA by PCR NEGATIVE NEGATIVE Final    Comment:        The GeneXpert MRSA Assay (FDA approved for NASAL specimens only), is one component of a comprehensive MRSA colonization surveillance program. It is not intended to diagnose MRSA infection nor to guide or monitor treatment for MRSA infections. Performed at [facility], [address] [place], [place] [address]   Culture, blood (single) w Reflex to ID Panel     Status: None (Preliminary result)   Collection Time: 04/20/21  3:37 AM   Specimen: BLOOD  Result Value Ref Range Status   Specimen Description BLOOD LEFT HAND  Final   Special Requests   Final    BOTTLES DRAWN AEROBIC AND ANAEROBIC Blood Culture adequate volume   Culture   Final    NO GROWTH < 12 HOURS Performed at [facility], [address], [place], [place] [address]    Report Status PENDING  Incomplete    Procedures and diagnostic studies:  ECHOCARDIOGRAM COMPLETE  Result Date: 04/20/2021    ECHOCARDIOGRAM REPORT   Patient Name:   [name] Date of Exam: 04/20/2021 Medical Rec #:  [id number]         Height:       62.0 in Accession #:    [id number]        Weight:       128.2 lb Date of Birth:  1946/05/22          BSA:          1.582 m Patient Age:    32 years          BP:           148/75 mmHg Patient Gender: F                 HR:           82 bpm. Exam Location:  [facility] Procedure: 2D Echo, Cardiac Doppler and Color Doppler Indications:     Bacteremia R78.81  History:  Patient has prior history of Echocardiogram examinations, most                  recent 07/02/2019. Stroke; Risk Factors:Hypertension.   Sonographer:     Sherrie Sport RDCS (AE) Referring Phys:  [place] Diagnosing Phys: Kathlyn Sacramento MD  Sonographer Comments: Suboptimal apical window. IMPRESSIONS  1. Left ventricular ejection fraction, by estimation, is 55 to 60%. The left ventricle has normal function. The left ventricle has no regional wall motion abnormalities. There is mild left ventricular hypertrophy. Left ventricular diastolic parameters are consistent with Grade I diastolic dysfunction (impaired relaxation).  2. Right ventricular systolic function is normal. The right ventricular size is normal. Tricuspid regurgitation signal is inadequate for assessing PA pressure.  3. Left atrial size was mildly dilated.  4. The mitral valve is normal in structure. No evidence of mitral valve regurgitation. No evidence of mitral stenosis.  5. The aortic valve is normal in structure. Aortic valve regurgitation is trivial. No aortic stenosis is present.  6. The inferior vena cava is normal in size with greater than 50% respiratory variability, suggesting right atrial pressure of 3 mmHg. FINDINGS  Left Ventricle: Left ventricular ejection fraction, by estimation, is 55 to 60%. The left ventricle has normal function. The left ventricle has no regional wall motion abnormalities. The left ventricular internal cavity size was normal in size. There is  mild left ventricular hypertrophy. Left ventricular diastolic parameters are consistent with Grade I diastolic dysfunction (impaired relaxation). Right Ventricle: The right ventricular size is normal. No increase in right ventricular wall thickness. Right ventricular systolic function is normal. Tricuspid regurgitation signal is inadequate for assessing PA pressure. Left Atrium: Left atrial size was mildly dilated. Right Atrium: Right atrial size was normal in size. Pericardium: There is no evidence of pericardial effusion. Mitral Valve: The mitral valve is normal in structure. No evidence of mitral valve  regurgitation. No evidence of mitral valve stenosis. Tricuspid Valve: The tricuspid valve is normal in structure. Tricuspid valve regurgitation is not demonstrated. No evidence of tricuspid stenosis. Aortic Valve: The aortic valve is normal in structure. Aortic valve regurgitation is trivial. No aortic stenosis is present. Aortic valve mean gradient measures 4.3 mmHg. Aortic valve peak gradient measures 8.0 mmHg. Aortic valve area, by VTI measures 2.04 cm. Pulmonic Valve: The pulmonic valve was normal in structure. Pulmonic valve regurgitation is not visualized. No evidence of pulmonic stenosis. Aorta: The aortic root is normal in size and structure. Venous: The inferior vena cava is normal in size with greater than 50% respiratory variability, suggesting right atrial pressure of 3 mmHg. IAS/Shunts: No atrial level shunt detected by color flow Doppler.  LEFT VENTRICLE PLAX 2D LVIDd:         3.72 cm  Diastology LVIDs:         2.63 cm  LV e' medial:    5.98 cm/s LV PW:         1.76 cm  LV E/e' medial:  10.0 LV IVS:        0.93 cm  LV e' lateral:   4.68 cm/s LVOT diam:     2.10 cm  LV E/e' lateral: 12.8 LV SV:         42 LV SV Index:   27 LVOT Area:     3.46 cm  RIGHT VENTRICLE RV Basal diam:  1.92 cm RV S prime:     12.10 cm/s TAPSE (M-mode): 3.0 cm LEFT ATRIUM  Index       RIGHT ATRIUM           Index LA diam:        3.00 cm 1.90 cm/m  RA Area:     11.70 cm LA Vol (A2C):   61.0 ml 38.55 ml/m RA Volume:   24.70 ml  15.61 ml/m LA Vol (A4C):   50.8 ml 32.10 ml/m LA Biplane Vol: 56.7 ml 35.83 ml/m  AORTIC VALVE                   PULMONIC VALVE AV Area (Vmax):    1.75 cm    PV Vmax:        0.87 m/s AV Area (Vmean):   1.78 cm    PV Peak grad:   3.1 mmHg AV Area (VTI):     2.04 cm    RVOT Peak grad: 5 mmHg AV Vmax:           141.00 cm/s AV Vmean:          96.833 cm/s AV VTI:            0.207 m AV Peak Grad:      8.0 mmHg AV Mean Grad:      4.3 mmHg LVOT Vmax:         71.10 cm/s LVOT Vmean:         49.700 cm/s LVOT VTI:          0.122 m LVOT/AV VTI ratio: 0.59  AORTA Ao Root diam: 3.20 cm MITRAL VALVE               TRICUSPID VALVE MV Area (PHT): 4.93 cm    TR Peak grad:   14.4 mmHg MV Decel Time: 154 msec    TR Vmax:        190.00 cm/s MV E velocity: 60.00 cm/s MV A velocity: 98.50 cm/s  SHUNTS MV E/A ratio:  0.61        Systemic VTI:  0.12 m                            Systemic Diam: 2.10 cm Kathlyn Sacramento MD Electronically signed by Kathlyn Sacramento MD Signature Date/Time: 04/20/2021/11:46:57 AM    Final                LOS: 12 days   Lavonia Eager  [facility]   Pager on [email or web address]. If 7PM-7AM, please contact night-coverage at [email or web address]     04/21/2021, 12:43 PM

## 2021-04-22 LAB — GLUCOSE, CAPILLARY
Glucose-Capillary: 150 mg/dL — ABNORMAL HIGH (ref 70–99)
Glucose-Capillary: 185 mg/dL — ABNORMAL HIGH (ref 70–99)

## 2021-04-22 LAB — HEMOGLOBIN AND HEMATOCRIT, BLOOD
HCT: 26.7 % — ABNORMAL LOW (ref 36.0–46.0)
Hemoglobin: 8.6 g/dL — ABNORMAL LOW (ref 12.0–15.0)

## 2021-04-22 MED ORDER — HEPARIN SODIUM (PORCINE) 1000 UNIT/ML IJ SOLN
4200.0000 [IU] | Freq: Once | INTRAMUSCULAR | Status: AC
  Administered 2021-04-22: 4200 [IU]
  Filled 2021-04-22: qty 4.2

## 2021-04-22 MED ORDER — INSULIN ASPART 100 UNIT/ML IJ SOLN
0.0000 [IU] | Freq: Three times a day (TID) | INTRAMUSCULAR | Status: DC
Start: 2021-04-22 — End: 2021-04-24
  Administered 2021-04-23 (×2): 1 [IU] via SUBCUTANEOUS
  Filled 2021-04-22 (×2): qty 1

## 2021-04-22 NOTE — TOC Progression Note (Signed)
Transition of Care [facility]) - Progression Note    Patient Details  Name: Kirsten Mcmahon MRN: [id number] Date of Birth: October 17, 1946  Transition of Care [facility]) CM/SW Contact  Candie Chroman, LCSW Phone Number: 04/22/2021, 8:41 AM  Clinical Narrative: Per ID physician and Nephrology NP, okay for patient to get 6 weeks IV abx in outpatient HD once discharged. Left voicemail for HD coordinator.    Expected Discharge Plan: [place] Barriers to Discharge: Continued Medical Work up  Expected Discharge Plan and Services Expected Discharge Plan: [place] arrangements for the past 2 months: Single Family Home                                       Social Determinants of Health (SDOH) Interventions    Readmission Risk Interventions Readmission Risk Prevention Plan 04/11/2021  Transportation Screening Complete  PCP or Specialist Appt within 3-5 Days Complete  HRI or [place] Complete  Social Work Consult for [place] Planning/Counseling Complete  Palliative Care Screening Not Applicable  Medication Review [employer] photographer) Complete  Some recent data might be hidden

## 2021-04-22 NOTE — Progress Notes (Signed)
Physical Therapy Treatment Patient Details Name: Kirsten Mcmahon MRN: [id number] DOB: 11-08-1946 Today's Date: 04/22/2021    History of Present Illness Pt is a 75 year old female with PMH including CKD, HTN, Type 2 diabetes, ESRD, and prior CVA with L side hemiplegia.  Pt presented to ER secondary to fluid overload; admitted for management of acute/chronic CKD (stage IV).  Hopsital course significant for R perm-cath placement (5/6).    PT Comments    Patient seen with OT for co-treat to maximize pt/therapist safety. Session limited by pt cognitive status; variable alertness (able to attend with verbal/tactile cues) intermittently closes eyes. Pt able to move RUE, AAROM for motion, but began yelling out in pain with any attempts to move LE. Extended time during session for education on continued mobility, importance of moving all extremities, choices provided to patient on type of transfer/mobility performed. Ultimately pt refused when rolling or supine to sit transfer was initiated. RN notified of pt status, recommendation for SNF remains appropriate due to decline in functional mobility and current level of assistance anticipated for mobility.      Follow Up Recommendations  SNF;Home health PT (SNF versus Home with PACE program. Needs extensive PT going forward.)     Equipment Recommendations  Other (comment) (defer to next level of care)    Recommendations for Other Services       Precautions / Restrictions Precautions Precautions: Fall Precaution Comments: R perm-cath; history of R fronto-temporal craniectomy with musculocutaneous flap over site Restrictions Weight Bearing Restrictions: No    Mobility  Bed Mobility               General bed mobility comments: unable to complete due to pt agitation, becomes emotional. significant time, education, and encouragement provided.    Transfers                    Ambulation/Gait                 Stairs              Wheelchair Mobility    Modified Rankin (Stroke Patients Only)       Balance                                            Cognition Arousal/Alertness:  (variable; intermittently closes eyes during session, wakes to voice/touch) Behavior During Therapy: Agitated;Flat affect;Anxious Overall Cognitive Status: No family/caregiver present to determine baseline cognitive functioning                                 General Comments: oriented to self, situation      Exercises Other Exercises Other Exercises: extended time during session for education on continued mobility, importance of moving all extremities, choices provided to patient on type of transfer/mobility performed. Ultimately pt refused.    General Comments        Pertinent Vitals/Pain Pain Assessment: Faces Faces Pain Scale: Hurts little more Pain Location: BLE with any mobility attempts Pain Descriptors / Indicators: Grimacing;Guarding;Moaning;Crying Pain Intervention(s): Limited activity within patient's tolerance;Monitored during session    Home Living                      Prior Function  PT Goals (current goals can now be found in the care plan section) Progress towards PT goals: Not progressing toward goals - comment (limited by participation, pt availability)    Frequency    Min 2X/week      PT Plan Discharge plan needs to be updated    Co-evaluation PT/OT/SLP Co-Evaluation/Treatment: Yes Reason for Co-Treatment: Complexity of the patient's impairments (multi-system involvement);Necessary to address cognition/behavior during functional activity;For patient/therapist safety;To address functional/ADL transfers PT goals addressed during session: Mobility/safety with mobility;Strengthening/ROM OT goals addressed during session: ADL's and self-care;Strengthening/ROM      AM-PAC PT "6 Clicks" Mobility   Outcome Measure  Help needed  turning from your back to your side while in a flat bed without using bedrails?: A Lot Help needed moving from lying on your back to sitting on the side of a flat bed without using bedrails?: A Lot Help needed moving to and from a bed to a chair (including a wheelchair)?: A Lot Help needed standing up from a chair using your arms (e.g., wheelchair or bedside chair)?: A Lot Help needed to walk in hospital room?: Total Help needed climbing 3-5 steps with a railing? : Total 6 Click Score: 10    End of Session Equipment Utilized During Treatment: Gait belt Activity Tolerance: Patient tolerated treatment well Patient left: in bed;with call bell/phone within reach;with bed alarm set (pt was seated EOB with OT at conclusion of PT session) Nurse Communication: Mobility status PT Visit Diagnosis: Muscle weakness (generalized) (M62.81);Difficulty in walking, not elsewhere classified (R26.2);Hemiplegia and hemiparesis Hemiplegia - Right/Left: Left Hemiplegia - dominant/non-dominant: Non-dominant Hemiplegia - caused by: Cerebral infarction     Time: 9163-8466 PT Time Calculation (min) (ACUTE ONLY): 38 min  Charges:  $Therapeutic Activity: 8-22 mins                     Lieutenant Diego PT, DPT 10:34 AM,04/22/21

## 2021-04-22 NOTE — Progress Notes (Signed)
This patient was tolerating her Hd tx until her system suddenly clotted at 1 hr RTD despite flushing of the circuit. Sherlyn Hay, NP was called and made aware, treatment was terminated, unable to return blood. patient's vital signs remained stable. CVC did not have clotting in it, CVc was flushed and heparin locked. Report was given to the care RN

## 2021-04-22 NOTE — Progress Notes (Signed)
Occupational Therapy Treatment Patient Details Name: CANDEE HOON MRN: [id number] DOB: Aug 29, 1946 Today's Date: 04/22/2021    History of present illness Pt is a 75 year old female with PMH including CKD, HTN, Type 2 diabetes, ESRD, and prior CVA with L side hemiplegia.  Pt presented to ER secondary to fluid overload; admitted for management of acute/chronic CKD (stage IV).  Hopsital course significant for R perm-cath placement (5/6).   OT comments  Pt seen for OT/PT co-treatment on this date to maximize pt/therapist safety. Upon arrival to room, pt awake and seated upright in bed. Pt oriented to self, place, and parts of situation, however intermittently closing eyes during session and inconsistently following 1-step commands. With SUPERVISION/SET-UP pt able to wash face with washcloth and with MAX A pt able to don/doff hospital gown at bed-level. Pt educated on the benefits of OOB mobility and encouraged to participate in bed/OOB mobility prior to HD, however pt declining all mobility attempts and refusing all physical assist for LE movement from therapists. Pt currently presents with decreased awareness of deficits, strength, and activity tolerance, and would benefit from additional skilled OT services to maximize return to PLOF and minimize risk of future falls, injury, caregiver burden, and readmission. Will continue to follow POC. Discharge recommendation remains appropriate. If pt/family refuse SNF, recommend hospital bed.   Follow Up Recommendations  SNF;Supervision/Assistance - 24 hour    Equipment Recommendations  3 in 1 bedside commode;Tub/shower seat;Hospital bed       Precautions / Restrictions Precautions Precautions: Fall Precaution Comments: R perm-cath; history of R fronto-temporal craniectomy with musculocutaneous flap over site Restrictions Weight Bearing Restrictions: No       Mobility Bed Mobility Overal bed mobility: Needs Assistance             General bed  mobility comments: Unable to complete due to pt agitation/pt becoming tearful, and pt continuing to refuse physical assistance from therapists. Encouragement and education on benefits of OOB mobility provided, however pt continuing to decline mobility    Transfers                 General transfer comment: pt declining all OOB attempts, including mechanical lift        ADL either performed or assessed with clinical judgement   ADL Overall ADL's : Needs assistance/impaired     Grooming: Wash/dry face;Bed level;Supervision/safety;Set up           Upper Body Dressing : Maximal assistance;Bed level Upper Body Dressing Details (indicate cue type and reason): To don/doff hospital gown, with pt able to doff R sleeve, but requiring MAX A for remainder of task                                   Cognition Arousal/Alertness: Awake/alert (variable; intermittently closes eyes during session, wakes to voice/touch) Behavior During Therapy: Agitated;Flat affect;Anxious Overall Cognitive Status: No family/caregiver present to determine baseline cognitive functioning                                 General Comments: Oriented to self, place, and parts situation. Intermittently closing eyes during session, wakes to voice/touch. Inconsistently following 1-step directions. Resistant to physical assistance from therapists        Exercises Other Exercises Other Exercises: Extensive education on benefits of OOB mobility provided, however pt continuing to decline all  mobility           Pertinent Vitals/ Pain       Pain Assessment: Faces Faces Pain Scale: Hurts little more Pain Location: BLE with any mobility attempts Pain Descriptors / Indicators: Grimacing;Guarding;Moaning;Crying Pain Intervention(s): Limited activity within patient's tolerance;Monitored during session         Frequency  Min 1X/week        Progress Toward Goals  OT Goals(current  goals can now be found in the care plan section)  Progress towards OT goals: OT to reassess next treatment  Acute Rehab OT Goals Patient Stated Goal: none stated OT Goal Formulation: Patient unable to participate in goal setting Time For Goal Achievement: 04/27/21 Potential to Achieve Goals: [place] Discharge plan remains appropriate;Frequency remains appropriate    Co-evaluation    PT/OT/SLP Co-Evaluation/Treatment: Yes Reason for Co-Treatment: Complexity of the patient's impairments (multi-system involvement);Necessary to address cognition/behavior during functional activity;For patient/therapist safety;To address functional/ADL transfers PT goals addressed during session: Mobility/safety with mobility;Strengthening/ROM OT goals addressed during session: ADL's and self-care;Strengthening/ROM      AM-PAC OT "6 Clicks" Daily Activity     Outcome Measure   Help from another person eating meals?: A Little Help from another person taking care of personal grooming?: A Little Help from another person toileting, which includes using toliet, bedpan, or urinal?: Total Help from another person bathing (including washing, rinsing, drying)?: A Lot Help from another person to put on and taking off regular upper body clothing?: A Lot Help from another person to put on and taking off regular lower body clothing?: A Lot 6 Click Score: 13    End of Session    OT Visit Diagnosis: Other abnormalities of gait and mobility (R26.89);Other symptoms and signs involving cognitive function   Activity Tolerance Patient limited by pain   Patient Left in bed;with call bell/phone within reach;with bed alarm set   Nurse Communication Mobility status        Time: 0092-3300 OT Time Calculation (min): 38 min  Charges: OT General Charges $OT Visit: 1 Visit OT Treatments $Self Care/Home Management : 23-37 mins  Fredirick Maudlin, [place]

## 2021-04-22 NOTE — Progress Notes (Signed)
Kirsten Mcmahon INFECTIOUS DISEASE PROGRESS NOTE Date of Admission:  04/09/2021     ID: Kirsten Mcmahon is a 75 y.o. female with Strep bacteremia Principal Problem:   Acute renal failure superimposed on stage 3b chronic kidney disease (HCC) Active Problems:   Hypertension   Hypothyroidism   GERD (gastroesophageal reflux disease)   Anemia in chronic kidney disease (CKD)   Type II diabetes mellitus with renal manifestations ([place])   ESRD needing dialysis ([place])   Leukocytosis   Fever   Streptococcal sepsis, unspecified ([place])   Streptococcal bacteremia   Subjective: No fevers, No new complaints. Seems to be tolerating HD.   ROS  Eleven systems are reviewed and negative except per hpi  Medications:  Antibiotics Given (last 72 hours)    Date/Time Action Medication Dose Rate   04/20/21 0542 New Bag/Given   cefTRIAXone (ROCEPHIN) 2 g in sodium chloride 0.9 % 100 mL IVPB 2 g 200 mL/hr   04/21/21 0530 New Bag/Given   ampicillin (OMNIPEN) 2 g in sodium chloride 0.9 % 100 mL IVPB 2 g 300 mL/hr   04/21/21 2230 New Bag/Given   ampicillin (OMNIPEN) 2 g in sodium chloride 0.9 % 100 mL IVPB 2 g 300 mL/hr   04/22/21 0857 New Bag/Given   ampicillin (OMNIPEN) 2 g in sodium chloride 0.9 % 100 mL IVPB 2 g 300 mL/hr     . amLODipine  10 mg Oral Daily  . aspirin  81 mg Oral Daily  . Chlorhexidine Gluconate Cloth  6 each Topical Q0600  . cholecalciferol  1,000 Units Oral Daily  . cloNIDine  0.3 mg Transdermal Weekly  . epoetin (EPOGEN/PROCRIT) injection  4,000 Units Intravenous Q M,W,F-HD  . feeding supplement  237 mL Oral TID BM  . heparin  5,000 Units Subcutaneous Q8H  . insulin aspart  0-6 Units Subcutaneous TID WC  . latanoprost  1 drop Both Eyes QHS  . letrozole  2.5 mg Oral Daily  . levETIRAcetam  500 mg Oral Daily  . levothyroxine  112 mcg Oral Q0600  . metoprolol succinate  50 mg Oral Daily  . multivitamin  1 tablet Oral QHS    Objective: Vital signs in last 24 hours: Temp:   [98.2 F (36.8 C)-98.8 F (37.1 C)] 98.7 F (37.1 C) (05/18 1300) Pulse Rate:  [92-115] 104 (05/18 1445) Resp:  [15-21] 21 (05/18 1300) BP: (112-139)/(67-93) 127/93 (05/18 1445) SpO2:  [99 %-100 %] 100 % (05/18 1445) Weight:  [57.5 kg] 57.5 kg (05/18 0549) Constitutional: Awake.  Sitting in the chair.  Somewhat slowed mentation.  Appears chronically ill. HENT: [place]/AT, PERRLA, no scleral icterus Mouth/Throat: Oropharynx is clear and moist. No oropharyngeal exudate.  Cardiovascular: Normal rate, regular rhythm and normal heart sounds. Exam reveals no gallop and no friction rub.  No murmur heard.  Pulmonary/Chest: Effort normal and breath sounds normal. No respiratory distress.  has no wheezes.  Neck = supple, no nuchal rigidity Right IJ hemodialysis catheter intact appears intact. Abdominal: Soft. Bowel sounds are normal.  exhibits no distension. There is no tenderness.  Lymphadenopathy: no cervical adenopathy. No axillary adenopathy Neurological: alert and oriented Skin: Skin is warm and dry. No rash noted. No erythema.  Psychiatric: a normal mood and affect.  behavior is normal.   Lab Results Recent Labs    04/20/21 0337  WBC 8.2  HGB 8.6*  HCT 26.5*  NA 136  K 3.6  CL 97*  CO2 28  BUN 39*  CREATININE 5.26*    Microbiology:  Results for orders placed or performed during the hospital encounter of 04/09/21  Resp Panel by RT-PCR (Flu A&B, Covid) Nasopharyngeal Swab     Status: None   Collection Time: 04/09/21  5:58 PM   Specimen: Nasopharyngeal Swab; Nasopharyngeal(NP) swabs in vial transport medium  Result Value Ref Range Status   SARS Coronavirus 2 by RT PCR NEGATIVE NEGATIVE Final    Comment: (NOTE) SARS-CoV-2 target nucleic acids are NOT DETECTED.  The SARS-CoV-2 RNA is generally detectable in upper respiratory specimens during the acute phase of infection. The lowest concentration of SARS-CoV-2 viral copies this assay can detect is 138 copies/mL. A negative result  does not preclude SARS-Cov-2 infection and should not be used as the sole basis for treatment or other patient management decisions. A negative result may occur with  improper specimen collection/handling, submission of specimen other than nasopharyngeal swab, presence of viral mutation(s) within the areas targeted by this assay, and inadequate number of viral copies(<138 copies/mL). A negative result must be combined with clinical observations, patient history, and epidemiological information. The expected result is Negative.  Fact Sheet for Patients:  EntrepreneurPulse.com.au  Fact Sheet for Healthcare Providers:  IncredibleEmployment.be  This test is no t yet approved or cleared by the [place] FDA and  has been authorized for detection and/or diagnosis of SARS-CoV-2 by FDA under an Emergency Use Authorization (EUA). This EUA will remain  in effect (meaning this test can be used) for the duration of the COVID-19 declaration under Section 564(b)(1) of the Act, 21 U.S.C.section 360bbb-3(b)(1), unless the authorization is terminated  or revoked sooner.       Influenza A by PCR NEGATIVE NEGATIVE Final   Influenza B by PCR NEGATIVE NEGATIVE Final    Comment: (NOTE) The Xpert Xpress SARS-CoV-2/FLU/RSV plus assay is intended as an aid in the diagnosis of influenza from Nasopharyngeal swab specimens and should not be used as a sole basis for treatment. Nasal washings and aspirates are unacceptable for Xpert Xpress SARS-CoV-2/FLU/RSV testing.  Fact Sheet for Patients: EntrepreneurPulse.com.au  Fact Sheet for Healthcare Providers: IncredibleEmployment.be  This test is not yet approved or cleared by the [place] FDA and has been authorized for detection and/or diagnosis of SARS-CoV-2 by FDA under an Emergency Use Authorization (EUA). This EUA will remain in effect (meaning this test can be used) for  the duration of the COVID-19 declaration under Section 564(b)(1) of the Act, 21 U.S.C. section 360bbb-3(b)(1), unless the authorization is terminated or revoked.  Performed at [facility], [address] [place], [place] [address]   CULTURE, BLOOD (ROUTINE X 2) w Reflex to ID Panel     Status: Abnormal   Collection Time: 04/17/21 11:58 AM   Specimen: BLOOD  Result Value Ref Range Status   Specimen Description   Final    BLOOD BLOOD RIGHT ARM Performed at [facility], [address] [place] [address], [place], [place] [address]    Special Requests   Final    BOTTLES DRAWN AEROBIC AND ANAEROBIC Blood Culture adequate volume Performed at [facility], [address], [place], [place] [address]    Culture  Setup Time   Final    GRAM POSITIVE COCCI IN BOTH AEROBIC AND ANAEROBIC BOTTLES CRITICAL RESULT CALLED TO, READ BACK BY AND VERIFIED WITH: JASON ROBBINS ON 04/18/21 AT 0035 QSD Performed at [facility], [address] [place], [place] [address]    Culture (A)  Final    STREPTOCOCCUS GROUP G SUSCEPTIBILITIES PERFORMED ON PREVIOUS CULTURE WITHIN  THE LAST 5 DAYS. Performed at [facility], [place] [address] [place], [place] [address]    Report Status 04/20/2021 FINAL  Final  CULTURE, BLOOD (ROUTINE X 2) w Reflex to ID Panel     Status: Abnormal   Collection Time: 04/17/21 11:58 AM   Specimen: BLOOD  Result Value Ref Range Status   Specimen Description   Final    BLOOD BLOOD RIGHT HAND Performed at [facility], [address], [place], [place] [address]    Special Requests   Final    BOTTLES DRAWN AEROBIC AND ANAEROBIC Blood Culture adequate volume Performed at [facility], [address] [place], [place] [address]    Culture  Setup Time   Final    GRAM POSITIVE COCCI IN BOTH AEROBIC AND ANAEROBIC BOTTLES CRITICAL RESULT CALLED TO, READ BACK BY AND VERIFIED WITH: JASON ROBBINS ON 04/18/21 AT 0035 QSD Performed at [facility], [place] [address] [place],  [address]    Culture STREPTOCOCCUS GROUP G (A)  Final   Report Status 04/20/2021 FINAL  Final   Organism ID, Bacteria STREPTOCOCCUS GROUP G  Final      Susceptibility   Streptococcus group g - MIC*    CLINDAMYCIN RESISTANT Resistant     AMPICILLIN <=0.25 SENSITIVE Sensitive     VANCOMYCIN 0.5 SENSITIVE Sensitive     CEFTRIAXONE <=0.12 SENSITIVE Sensitive     LEVOFLOXACIN 0.5 SENSITIVE Sensitive     PENICILLIN Value in next row Sensitive      SENSITIVE<=0.06    * STREPTOCOCCUS GROUP G  Blood Culture ID Panel (Reflexed)     Status: Abnormal   Collection Time: 04/17/21 11:58 AM  Result Value Ref Range Status   Enterococcus faecalis NOT DETECTED NOT DETECTED Final   Enterococcus Faecium NOT DETECTED NOT DETECTED Final   Listeria monocytogenes NOT DETECTED NOT DETECTED Final   Staphylococcus species NOT DETECTED NOT DETECTED Final   Staphylococcus aureus (BCID) NOT DETECTED NOT DETECTED Final   Staphylococcus epidermidis NOT DETECTED NOT DETECTED Final   Staphylococcus lugdunensis NOT DETECTED NOT DETECTED Final   Streptococcus species DETECTED (A) NOT DETECTED Final    Comment: Not Enterococcus species, Streptococcus agalactiae, Streptococcus pyogenes, or Streptococcus pneumoniae. CRITICAL RESULT CALLED TO, READ BACK BY AND VERIFIED WITH: JASON ROBBINS ON 04/18/21 AT 0035 QSD    Streptococcus agalactiae NOT DETECTED NOT DETECTED Final   Streptococcus pneumoniae NOT DETECTED NOT DETECTED Final   Streptococcus pyogenes NOT DETECTED NOT DETECTED Final   A.calcoaceticus-baumannii NOT DETECTED NOT DETECTED Final   Bacteroides fragilis NOT DETECTED NOT DETECTED Final   Enterobacterales NOT DETECTED NOT DETECTED Final   Enterobacter cloacae complex NOT DETECTED NOT DETECTED Final   Escherichia coli NOT DETECTED NOT DETECTED Final   Klebsiella aerogenes NOT DETECTED NOT DETECTED Final   Klebsiella oxytoca NOT DETECTED NOT DETECTED Final    Klebsiella pneumoniae NOT DETECTED NOT DETECTED Final   Proteus species NOT DETECTED NOT DETECTED Final   Salmonella species NOT DETECTED NOT DETECTED Final   Serratia marcescens NOT DETECTED NOT DETECTED Final   Haemophilus influenzae NOT DETECTED NOT DETECTED Final   Neisseria meningitidis NOT DETECTED NOT DETECTED Final   Pseudomonas aeruginosa NOT DETECTED NOT DETECTED Final   Stenotrophomonas maltophilia NOT DETECTED NOT DETECTED Final   Candida albicans NOT DETECTED NOT DETECTED Final   Candida auris NOT DETECTED NOT DETECTED Final   Candida glabrata NOT DETECTED NOT DETECTED Final   Candida krusei NOT DETECTED NOT DETECTED  Final   Candida parapsilosis NOT DETECTED NOT DETECTED Final   Candida tropicalis NOT DETECTED NOT DETECTED Final   Cryptococcus neoformans/gattii NOT DETECTED NOT DETECTED Final    Comment: Performed at [facility], [address] [place], [place] [address]  MRSA PCR Screening     Status: None   Collection Time: 04/17/21  3:46 PM   Specimen: Nasal Mucosa; Nasopharyngeal  Result Value Ref Range Status   MRSA by PCR NEGATIVE NEGATIVE Final    Comment:        The GeneXpert MRSA Assay (FDA approved for NASAL specimens only), is one component of a comprehensive MRSA colonization surveillance program. It is not intended to diagnose MRSA infection nor to guide or monitor treatment for MRSA infections. Performed at [facility], [address] [place], [place] [address]   Culture, blood (single) w Reflex to ID Panel     Status: None (Preliminary result)   Collection Time: 04/20/21  3:37 AM   Specimen: BLOOD  Result Value Ref Range Status   Specimen Description BLOOD LEFT HAND  Final   Special Requests   Final    BOTTLES DRAWN AEROBIC AND ANAEROBIC Blood Culture adequate volume   Culture   Final    NO GROWTH 1 DAY Performed at [facility], [address], [place], [place] [address]    Report Status PENDING  Incomplete     Studies/Results: No results found.  Assessment/Plan: Kirsten Mcmahon is a 75 y.o. female with history of progressive chronic kidney disease admitted with worsening edema and found to have acute on chronic kidney disease.  She had a  hemodialysis catheter placed May 6 and has started hemodialysis.  She was afebrile on admission but may 12-13 developed high fevers as well as leukocytosis.  Blood cultures were done and are growing group G  strep in 2 sets.   She was started on ceftriaxone and has defervesced.  White count had come down from 29,000 to 8000 .  She has had an echocardiogram with no evidence of vegetation. Unclear etiology of her bacteremia.  Group B strep can be part of oral flora as well as GI flora.  Could also be from her hemodialysis catheter but I think that would be an unusual pathogen for this.  Group G strep several propensity to cause endocarditis however.  5/17 - no fevers, fu bcx neg 5/16.  5/18 fu bcx neg, no fevers Recommendations Son declines TEE. Can treat with 6 weeks IV vanco since cannot rule out endocarditis.  Cont vanco at HD until June 27th After abx stopped would check bcx at dialysis 10 days after last dose to ensure clearance of bacteremia as the cath was not removed.    ID will sign off- please call with questions  Leonel Ramsay   04/22/2021, 2:53 PM

## 2021-04-22 NOTE — Progress Notes (Signed)
   04/22/21 1623  Assess: MEWS Score  Temp 98.3 F (36.8 C)  BP 136/85  Pulse Rate (!) 116  Resp (!) 26  SpO2 100 %  O2 Device Room Air  Assess: MEWS Score  MEWS Temp 0  MEWS Systolic 0  MEWS Pulse 2  MEWS RR 2  MEWS LOC 0  MEWS Score 4  MEWS Score Color Red  Assess: if the MEWS score is Yellow or Red  Were vital signs taken at a resting state? Yes  Focused Assessment No change from prior assessment  Early Detection of Sepsis Score *See Row Information* Medium  MEWS guidelines implemented *See Row Information* Yes  Treat  MEWS Interventions Administered scheduled meds/treatments;Escalated (See documentation below)  Pain Scale 0-10  Pain Score 0  Take Vital Signs  Increase Vital Sign Frequency  Red: Q 1hr X 4 then Q 4hr X 4, if remains red, continue Q 4hrs  Escalate  MEWS: Escalate Red: discuss with charge nurse/RN and provider, consider discussing with RRT  Notify: Charge Nurse/RN  Name of Charge Nurse/RN Notified [place]  Date Charge Nurse/RN Notified 04/22/21  Time Charge Nurse/RN Notified 1623  Notify: Provider  Provider Name/Title Dr. Jimmye Norman  Date Provider Notified 04/22/21  Time Provider Notified [phone]  Notification Type Page  Notification Reason Other (Comment) (red mews)  Provider response No new orders  Date of Provider Response 04/22/21  Time of Provider Response 1624  Assess: SIRS CRITERIA  SIRS Temperature  0  SIRS Pulse 1  SIRS Respirations  1  SIRS WBC 0  SIRS Score Sum  2

## 2021-04-22 NOTE — Progress Notes (Signed)
SLP Cancellation Note  Patient Details Name: Kirsten Mcmahon MRN: [id number] DOB: 08/18/1946   Cancelled treatment:       Reason Eval/Treat Not Completed: Patient at procedure or test/unavailable (pt at HD). ST services will f/u tomorrow w/ pt's toleration of diet and any educational needs re: the dysphagia diet recommended. Recommend aspiration precautions w/ oral intake. NSG consulted; no concerns this morning w/ swallowing prior to HD - pt just not taking much po's per NSG report.      Orinda Kenner, MS, CCC-SLP Speech Language Pathologist Rehab Services [phone] [facility] 04/22/2021, 4:36 PM

## 2021-04-22 NOTE — Progress Notes (Addendum)
Secure chat to Dr. Jimmye Norman that per report from dialysis nurse, dialysis access malfunctioned today and dialysis nurse was unable to return approximately 250 ml of blood to the patient.   1633: secure chat to MD: vitals triggered a RED MEWS, I did just give her the BP meds and beta blocker that were held prior to HD. Vitals T 98.3, BP 136/65, HR 116, RR 26, 100% on room air. She isn't near as alert as she was prior to dialysis.   1634: no new orders. Charge nurse is aware of vitals and has been in to see patient.

## 2021-04-22 NOTE — Progress Notes (Signed)
Saw patient yesterday and spoke with her son. Was asked to consider transesophageal echo. Patient is unable to give consent. Her family member prefer not to proceed at present. Will be available if consideration for further attempt a To consider a transesophageal echo in the future.

## 2021-04-22 NOTE — Progress Notes (Signed)
[name]  ROUNDING NOTE   Subjective:   Patient seen laying in bed Alert and oriented, yet slow to respond States she is well today Denies shortness of breath Patient seen later with breakfast tray at bedside States she will eat  Objective:  Vital signs in last 24 hours:  Temp:  [97.8 F (36.6 C)-98.8 F (37.1 C)] 98.8 F (37.1 C) (05/18 0731) Pulse Rate:  [79-98] 98 (05/18 0731) Resp:  [15-18] 15 (05/18 0731) BP: (111-120)/(67-75) 112/67 (05/18 0731) SpO2:  [99 %-100 %] 100 % (05/18 0731) Weight:  [57.5 kg] 57.5 kg (05/18 0549)  Weight change: 1.27 kg Filed Weights   04/20/21 0601 04/21/21 0500 04/22/21 0549  Weight: 58.2 kg 56.2 kg 57.5 kg    Intake/Output: I/O last 3 completed shifts: In: 274.4 [I.V.:74.4; IV Piggyback:200] Out: -    Intake/Output this shift:  Total I/O In: 262.6 [I.V.:162.6; IV Piggyback:100] Out: -   Physical Exam: General: NAD, resting in bed  Head: Normocephalic, atraumatic. Moist oral mucosal membranes  Eyes: Anicteric  Lungs:  Diminished in bases  Heart: Regular rhythm and rate  Abdomen:  Soft, nontender,  distended  Extremities:  no peripheral edema.  Neurologic: Alert and oriented to self  Skin: No lesions  Access: RIJ permcath 5/6 Dr. Lucky Cowboy    Basic Metabolic Panel: Recent Labs  Lab 04/15/21 1007 04/16/21 1114 04/16/21 1210 04/17/21 0415 04/18/21 0408 04/19/21 0400 04/20/21 0337  NA 137  --  137 138 138 139 136  K 3.4*  --  3.1* 3.5 3.6 3.6 3.6  CL 100  --  96* 100 100 100 97*  CO2 27  --  27 26 28 27 28   GLUCOSE 125*  --  119* 123* 185* 156* 157*  BUN 11  --  <5* 12 28* 24* 39*  CREATININE 3.00*  --  1.17* 3.08* 4.89* 3.64* 5.26*  CALCIUM 7.9*  --  8.6* 8.3* 8.1* 8.3* 8.2*  MG 1.8 1.8  --   --  1.9  --  2.0  PHOS  --   --  1.4* 3.9 3.2  --  2.2*    Liver Function Tests: Recent Labs  Lab 04/16/21 1210  ALBUMIN 3.2*   No results for input(s): LIPASE, AMYLASE in the last 168 hours. No results  for input(s): AMMONIA in the last 168 hours.  CBC: Recent Labs  Lab 04/15/21 1007 04/16/21 1212 04/17/21 0415 04/18/21 0408 04/19/21 0400 04/20/21 0337  WBC 11.2* 18.2* 29.3* 17.2* 13.6* 8.2  NEUTROABS 8.5*  --  26.6* 15.3* 11.2* 5.8  HGB 9.1* 9.6* 8.5* 7.9* 8.0* 8.6*  HCT 27.0* 29.4* 26.7* 24.7* 25.4* 26.5*  MCV 88.2 88.3 92.1 92.5 92.0 90.8  PLT 206 257 226 209 230 268    Cardiac Enzymes: No results for input(s): CKTOTAL, CKMB, CKMBINDEX, TROPONINI in the last 168 hours.  BNP: Invalid input(s): POCBNP  CBG: No results for input(s): GLUCAP in the last 168 hours.  Microbiology: Results for orders placed or performed during the hospital encounter of 04/09/21  Resp Panel by RT-PCR (Flu A&B, Covid) Nasopharyngeal Swab     Status: None   Collection Time: 04/09/21  5:58 PM   Specimen: Nasopharyngeal Swab; Nasopharyngeal(NP) swabs in vial transport medium  Result Value Ref Range Status   SARS Coronavirus 2 by RT PCR NEGATIVE NEGATIVE Final    Comment: (NOTE) SARS-CoV-2 target nucleic acids are NOT DETECTED.  The SARS-CoV-2 RNA is generally detectable in upper respiratory specimens during the acute phase of  infection. The lowest concentration of SARS-CoV-2 viral copies this assay can detect is 138 copies/mL. A negative result does not preclude SARS-Cov-2 infection and should not be used as the sole basis for treatment or other patient management decisions. A negative result may occur with  improper specimen collection/handling, submission of specimen other than nasopharyngeal swab, presence of viral mutation(s) within the areas targeted by this assay, and inadequate number of viral copies(<138 copies/mL). A negative result must be combined with clinical observations, patient history, and epidemiological information. The expected result is Negative.  Fact Sheet for Patients:  EntrepreneurPulse.com.au  Fact Sheet for Healthcare Providers:   IncredibleEmployment.be  This test is no t yet approved or cleared by the [place] FDA and  has been authorized for detection and/or diagnosis of SARS-CoV-2 by FDA under an Emergency Use Authorization (EUA). This EUA will remain  in effect (meaning this test can be used) for the duration of the COVID-19 declaration under Section 564(b)(1) of the Act, 21 U.S.C.section 360bbb-3(b)(1), unless the authorization is terminated  or revoked sooner.       Influenza A by PCR NEGATIVE NEGATIVE Final   Influenza B by PCR NEGATIVE NEGATIVE Final    Comment: (NOTE) The Xpert Xpress SARS-CoV-2/FLU/RSV plus assay is intended as an aid in the diagnosis of influenza from Nasopharyngeal swab specimens and should not be used as a sole basis for treatment. Nasal washings and aspirates are unacceptable for Xpert Xpress SARS-CoV-2/FLU/RSV testing.  Fact Sheet for Patients: EntrepreneurPulse.com.au  Fact Sheet for Healthcare Providers: IncredibleEmployment.be  This test is not yet approved or cleared by the [place] FDA and has been authorized for detection and/or diagnosis of SARS-CoV-2 by FDA under an Emergency Use Authorization (EUA). This EUA will remain in effect (meaning this test can be used) for the duration of the COVID-19 declaration under Section 564(b)(1) of the Act, 21 U.S.C. section 360bbb-3(b)(1), unless the authorization is terminated or revoked.  Performed at [facility], [address] [place], [place] [address]   CULTURE, BLOOD (ROUTINE X 2) w Reflex to ID Panel     Status: Abnormal   Collection Time: 04/17/21 11:58 AM   Specimen: BLOOD  Result Value Ref Range Status   Specimen Description   Final    BLOOD BLOOD RIGHT ARM Performed at [facility], [address] [place], [place] [address]    Special Requests   Final    BOTTLES DRAWN AEROBIC AND ANAEROBIC Blood Culture adequate  volume Performed at [facility], [address] [place], [place] [address]    Culture  Setup Time   Final    GRAM POSITIVE COCCI IN BOTH AEROBIC AND ANAEROBIC BOTTLES CRITICAL RESULT CALLED TO, READ BACK BY AND VERIFIED WITH: JASON ROBBINS ON 04/18/21 AT 0035 QSD Performed at [facility], [address] [place], [place] [address]    Culture (A)  Final    STREPTOCOCCUS GROUP G SUSCEPTIBILITIES PERFORMED ON PREVIOUS CULTURE WITHIN THE LAST 5 DAYS. Performed at [facility], [place] [address], [place],  [address]    Report Status 04/20/2021 FINAL  Final  CULTURE, BLOOD (ROUTINE X 2) w Reflex to ID Panel     Status: Abnormal   Collection Time: 04/17/21 11:58 AM   Specimen: BLOOD  Result Value Ref Range Status   Specimen Description   Final    BLOOD BLOOD RIGHT HAND Performed at [facility], [address] [place],  [address]    Special Requests  Final    BOTTLES DRAWN AEROBIC AND ANAEROBIC Blood Culture adequate volume Performed at [address] [place], [place] [address]    Culture  Setup Time   Final    GRAM POSITIVE COCCI IN BOTH AEROBIC AND ANAEROBIC BOTTLES CRITICAL RESULT CALLED TO, READ BACK BY AND VERIFIED WITH: JASON ROBBINS ON 04/18/21 AT 3086 QSD Performed at [facility], [place] [address], [place], [place] [address]    Culture STREPTOCOCCUS GROUP G (A)  Final   Report Status 04/20/2021 FINAL  Final   Organism ID, Bacteria STREPTOCOCCUS GROUP G  Final      Susceptibility   Streptococcus group g - MIC*    CLINDAMYCIN RESISTANT Resistant     AMPICILLIN <=0.25 SENSITIVE Sensitive     VANCOMYCIN 0.5 SENSITIVE Sensitive     CEFTRIAXONE <=0.12 SENSITIVE Sensitive     LEVOFLOXACIN 0.5 SENSITIVE Sensitive     PENICILLIN Value in next row Sensitive      SENSITIVE<=0.06    * STREPTOCOCCUS GROUP G  Blood Culture ID Panel (Reflexed)     Status: Abnormal   Collection Time: 04/17/21 11:58 AM   Result Value Ref Range Status   Enterococcus faecalis NOT DETECTED NOT DETECTED Final   Enterococcus Faecium NOT DETECTED NOT DETECTED Final   Listeria monocytogenes NOT DETECTED NOT DETECTED Final   Staphylococcus species NOT DETECTED NOT DETECTED Final   Staphylococcus aureus (BCID) NOT DETECTED NOT DETECTED Final   Staphylococcus epidermidis NOT DETECTED NOT DETECTED Final   Staphylococcus lugdunensis NOT DETECTED NOT DETECTED Final   Streptococcus species DETECTED (A) NOT DETECTED Final    Comment: Not Enterococcus species, Streptococcus agalactiae, Streptococcus pyogenes, or Streptococcus pneumoniae. CRITICAL RESULT CALLED TO, READ BACK BY AND VERIFIED WITH: JASON ROBBINS ON 04/18/21 AT 0035 QSD    Streptococcus agalactiae NOT DETECTED NOT DETECTED Final   Streptococcus pneumoniae NOT DETECTED NOT DETECTED Final   Streptococcus pyogenes NOT DETECTED NOT DETECTED Final   A.calcoaceticus-baumannii NOT DETECTED NOT DETECTED Final   Bacteroides fragilis NOT DETECTED NOT DETECTED Final   Enterobacterales NOT DETECTED NOT DETECTED Final   Enterobacter cloacae complex NOT DETECTED NOT DETECTED Final   Escherichia coli NOT DETECTED NOT DETECTED Final   Klebsiella aerogenes NOT DETECTED NOT DETECTED Final   Klebsiella oxytoca NOT DETECTED NOT DETECTED Final   Klebsiella pneumoniae NOT DETECTED NOT DETECTED Final   Proteus species NOT DETECTED NOT DETECTED Final   Salmonella species NOT DETECTED NOT DETECTED Final   Serratia marcescens NOT DETECTED NOT DETECTED Final   Haemophilus influenzae NOT DETECTED NOT DETECTED Final   Neisseria meningitidis NOT DETECTED NOT DETECTED Final   Pseudomonas aeruginosa NOT DETECTED NOT DETECTED Final   Stenotrophomonas maltophilia NOT DETECTED NOT DETECTED Final   Candida albicans NOT DETECTED NOT DETECTED Final   Candida auris NOT DETECTED NOT DETECTED Final   Candida glabrata NOT DETECTED NOT DETECTED Final   Candida krusei NOT DETECTED NOT DETECTED  Final   Candida parapsilosis NOT DETECTED NOT DETECTED Final   Candida tropicalis NOT DETECTED NOT DETECTED Final   Cryptococcus neoformans/gattii NOT DETECTED NOT DETECTED Final    Comment: Performed at [facility], [address] [place], [place] [address]  MRSA PCR Screening     Status: None   Collection Time: 04/17/21  3:46 PM   Specimen: Nasal Mucosa; Nasopharyngeal  Result Value Ref Range Status   MRSA by PCR NEGATIVE NEGATIVE Final    Comment:  The GeneXpert MRSA Assay (FDA approved for NASAL specimens only), is one component of a comprehensive MRSA colonization surveillance program. It is not intended to diagnose MRSA infection nor to guide or monitor treatment for MRSA infections. Performed at [facility], [address] [place], [place] [address]   Culture, blood (single) w Reflex to ID Panel     Status: None (Preliminary result)   Collection Time: 04/20/21  3:37 AM   Specimen: BLOOD  Result Value Ref Range Status   Specimen Description BLOOD LEFT HAND  Final   Special Requests   Final    BOTTLES DRAWN AEROBIC AND ANAEROBIC Blood Culture adequate volume   Culture   Final    NO GROWTH 1 DAY Performed at [facility], [address], [place], [place] [address]    Report Status PENDING  Incomplete    Coagulation Studies: No results for input(s): LABPROT, INR in the last 72 hours.  Urinalysis: No results for input(s): COLORURINE, LABSPEC, PHURINE, GLUCOSEU, HGBUR, BILIRUBINUR, KETONESUR, PROTEINUR, UROBILINOGEN, NITRITE, LEUKOCYTESUR in the last 72 hours.  Invalid input(s): APPERANCEUR    Imaging: No results found.   Medications:   . sodium chloride 5 mL/hr at 04/22/21 0709  . ampicillin (OMNIPEN) IV 2 g (04/22/21 0857)   . amLODipine  10 mg Oral Daily  . aspirin  81 mg Oral Daily  . Chlorhexidine Gluconate Cloth  6 each Topical Q0600  . cholecalciferol  1,000 Units Oral Daily  . cloNIDine  0.3 mg Transdermal  Weekly  . epoetin (EPOGEN/PROCRIT) injection  4,000 Units Intravenous Q M,W,F-HD  . feeding supplement  237 mL Oral TID BM  . heparin  5,000 Units Subcutaneous Q8H  . latanoprost  1 drop Both Eyes QHS  . letrozole  2.5 mg Oral Daily  . levETIRAcetam  500 mg Oral Daily  . levothyroxine  112 mcg Oral Q0600  . metoprolol succinate  50 mg Oral Daily  . multivitamin  1 tablet Oral QHS   sodium chloride, acetaminophen **OR** acetaminophen, calcium carbonate, camphor-menthol **AND** hydrOXYzine, docusate sodium, hydrALAZINE, LORazepam, ondansetron **OR** ondansetron (ZOFRAN) IV, sorbitol  Assessment/ Plan:  [name] is a 75 y.o.  female  with past medical history of GERD, Hypertension, diabetes, hyperlipidemia, CVA and CKD stage 4. She presents to the ED with increased lower extremity edema. She is known to our team from previous admissions.   1. End Stage Renal Disease requiring new start hemodialysis:  First dialysis treatment 5/6.   - Receiving dialysis today - Next treatment scheduled for Friday - Blood cultures positive for group G strep and started on Ceftriaxone.  - HD cath believed to be unlikely source of infection - Awaiting recent set of blood culture results - Will require 6 weeks IV vancomycin with dialysis outpatient - Outpatient planning: Dialysis liaison notified of outpatient center acceptance at [facility], MWF, 1115. All parties aware   2. Anemia of chronic kidney disease  Lab Results  Component Value Date   HGB 8.6 (L) 04/20/2021   EPO with treatments   3. Secondary Hyperparathyroidism: with hypocalcemia   Lab Results  Component Value Date   PTH 161 (H) 01/18/2021   CALCIUM 8.2 (L) 04/20/2021   PHOS 2.2 (L) 04/20/2021  Phosphorus below target Oral intake improving  4.Diabetes mellitus type II with chronic kidney disease noninsulin dependent.  hemoglobin A1c 5.7 on 01/18/21.  Stable   5. Hypertension: Home regimen of clonidine, amlodipine,  metoprolol continued Stable at this time  LOS: [place] 5/18/20229:47 AM

## 2021-04-22 NOTE — Progress Notes (Signed)
PROGRESS NOTE    Kirsten Mcmahon  XBM:[id number] DOB: [birth year] DOA: 04/09/2021 PCP: [facility]  Assessment & Plan:   Principal Problem:   Acute renal failure superimposed on stage 3b chronic kidney disease ([place]) Active Problems:   Hypertension   Hypothyroidism   GERD (gastroesophageal reflux disease)   Anemia in chronic kidney disease (CKD)   Type II diabetes mellitus with renal manifestations ([place])   ESRD needing dialysis ([place])   Leukocytosis   Fever   Streptococcal sepsis, unspecified ([place])   Streptococcal bacteremia    ESRD: HD was initiated on 04/10/2021.  Will continue on HD MWF as per nephro. Nephro recs apprec   Streptococcal sepsis and bacteremia:  continue on IV ampicillin x 6 weeks as per ID.  No evidence of vegetation on 2D echo.  Pt's son declined TEE.   Likely metabolic encephalopathy/delirium: possibly underlying cognitive impairment. Likely close to her baseline   Hypokalemia: WNL   Hypophosphatemia: Improved  Left lower extremity edema: No evidence of DVT.  ACD: s/p 1 unit of PRBCs transfused on 04/11/2021.  Seizure disorder: continue on keppra  HTN: continue on amlodipine, clonidine, metoprolol   DM2: continue on SSI w/ accuchecks   Dysphagia: continue on dysphagia II diet as per speech    DVT prophylaxis: heparin  Code Status: full  Family Communication: discussed pt's care w/ pt's son, [name], and answered his questions Disposition Plan: likely d/c back home.  Level of care: Med-Surg   Status is: Inpatient  Remains inpatient appropriate because:Inpatient level of care appropriate due to severity of illness   Dispo: The patient is from: Home              Anticipated d/c is to: Home              Patient currently is not medically stable to d/c.   Difficult to place patient: unclear     Consultants:   nephro    Procedures:    Antimicrobials: ampicillin    Subjective: Pt c/o  fatigue  Objective: Vitals:   04/21/21 2114 04/22/21 0549 04/22/21 0731 04/22/21 1057  BP: 120/74 118/75 112/67 139/76  Pulse: 96 92 98 (!) 102  Resp: 16 16 15 18   Temp: 98.8 F (37.1 C) 98.6 F (37 C) 98.8 F (37.1 C) 98.2 F (36.8 C)  TempSrc: Oral Oral    SpO2: 100% 99% 100% 100%  Weight:  57.5 kg    Height:        Intake/Output Summary (Last 24 hours) at 04/22/2021 1237 Last data filed at 04/22/2021 0709 Gross per 24 hour  Intake 537.07 ml  Output --  Net 537.07 ml   Filed Weights   04/20/21 0601 04/21/21 0500 04/22/21 0549  Weight: 58.2 kg 56.2 kg 57.5 kg    Examination:  General exam: Appears calm and comfortable  Respiratory system: Clear to auscultation. Respiratory effort normal. Cardiovascular system: S1 & S2 +. No  rubs, gallops or clicks.  Gastrointestinal system: Abdomen is nondistended, soft and nontender. Normal bowel sounds heard. Central nervous system: Lethargic. Moves all 4 extremities  Psychiatry: Judgement and insight appear normal. Flat mood and affect     Data Reviewed: I have personally reviewed following labs and imaging studies  CBC: Recent Labs  Lab 04/16/21 1212 04/17/21 0415 04/18/21 0408 04/19/21 0400 04/20/21 0337  WBC 18.2* 29.3* 17.2* 13.6* 8.2  NEUTROABS  --  26.6* 15.3* 11.2* 5.8  HGB 9.6* 8.5* 7.9* 8.0* 8.6*  HCT 29.4* 26.7*  24.7* 25.4* 26.5*  MCV 88.3 92.1 92.5 92.0 90.8  PLT 257 226 209 230 782   Basic Metabolic Panel: Recent Labs  Lab 04/16/21 1114 04/16/21 1210 04/17/21 0415 04/18/21 0408 04/19/21 0400 04/20/21 0337  NA  --  137 138 138 139 136  K  --  3.1* 3.5 3.6 3.6 3.6  CL  --  96* 100 100 100 97*  CO2  --  27 26 28 27 28   GLUCOSE  --  119* 123* 185* 156* 157*  BUN  --  <5* 12 28* 24* 39*  CREATININE  --  1.17* 3.08* 4.89* 3.64* 5.26*  CALCIUM  --  8.6* 8.3* 8.1* 8.3* 8.2*  MG 1.8  --   --  1.9  --  2.0  PHOS  --  1.4* 3.9 3.2  --  2.2*   GFR: Estimated Creatinine Clearance: 7.2 mL/min (A) (by  C-G formula based on SCr of 5.26 mg/dL (H)). Liver Function Tests: Recent Labs  Lab 04/16/21 1210  ALBUMIN 3.2*   No results for input(s): LIPASE, AMYLASE in the last 168 hours. No results for input(s): AMMONIA in the last 168 hours. Coagulation Profile: No results for input(s): INR, PROTIME in the last 168 hours. Cardiac Enzymes: No results for input(s): CKTOTAL, CKMB, CKMBINDEX, TROPONINI in the last 168 hours. BNP (last 3 results) No results for input(s): PROBNP in the last 8760 hours. HbA1C: No results for input(s): HGBA1C in the last 72 hours. CBG: No results for input(s): GLUCAP in the last 168 hours. Lipid Profile: No results for input(s): CHOL, HDL, LDLCALC, TRIG, CHOLHDL, LDLDIRECT in the last 72 hours. Thyroid Function Tests: No results for input(s): TSH, T4TOTAL, FREET4, T3FREE, THYROIDAB in the last 72 hours. Anemia Panel: No results for input(s): VITAMINB12, FOLATE, FERRITIN, TIBC, IRON, RETICCTPCT in the last 72 hours. Sepsis Labs: Recent Labs  Lab 04/17/21 1158 04/17/21 1354 04/18/21 0408 04/19/21 0400  PROCALCITON 1.88  --  5.20 6.03  LATICACIDVEN 1.3 1.2  --   --     Recent Results (from the past 240 hour(s))  CULTURE, BLOOD (ROUTINE X 2) w Reflex to ID Panel     Status: Abnormal   Collection Time: 04/17/21 11:58 AM   Specimen: BLOOD  Result Value Ref Range Status   Specimen Description   Final    BLOOD BLOOD RIGHT ARM Performed at [facility], [address] [place], [place] [address]    Special Requests   Final    BOTTLES DRAWN AEROBIC AND ANAEROBIC Blood Culture adequate volume Performed at [facility], [address], [place], [place] [address]    Culture  Setup Time   Final    GRAM POSITIVE COCCI IN BOTH AEROBIC AND ANAEROBIC BOTTLES CRITICAL RESULT CALLED TO, READ BACK BY AND VERIFIED WITH: JASON ROBBINS ON 04/18/21 AT 0035 QSD Performed at [facility], [address] [place], [place] [address]    Culture  (A)  Final    STREPTOCOCCUS GROUP G SUSCEPTIBILITIES PERFORMED ON PREVIOUS CULTURE WITHIN THE LAST 5 DAYS. Performed at [facility], [place] [address], [place], [place] [address]    Report Status 04/20/2021 FINAL  Final  CULTURE, BLOOD (ROUTINE X 2) w Reflex to ID Panel     Status: Abnormal   Collection Time: 04/17/21 11:58 AM   Specimen: BLOOD  Result Value Ref Range Status   Specimen Description   Final    BLOOD BLOOD RIGHT HAND Performed at [facility] [address] [place], [place] [address]    Special Requests   Final    BOTTLES DRAWN AEROBIC AND ANAEROBIC Blood Culture adequate volume Performed at [facility], [address] [place], [place] [address]    Culture  Setup Time   Final    GRAM POSITIVE COCCI IN BOTH AEROBIC AND ANAEROBIC BOTTLES CRITICAL RESULT CALLED TO, READ BACK BY AND VERIFIED WITH: JASON ROBBINS ON 04/18/21 AT 9798 QSD Performed at [facility], [place] [address], [place], [place] [address]    Culture STREPTOCOCCUS GROUP G (A)  Final   Report Status 04/20/2021 FINAL  Final   Organism ID, Bacteria STREPTOCOCCUS GROUP G  Final      Susceptibility   Streptococcus group g - MIC*    CLINDAMYCIN RESISTANT Resistant     AMPICILLIN <=0.25 SENSITIVE Sensitive     VANCOMYCIN 0.5 SENSITIVE Sensitive     CEFTRIAXONE <=0.12 SENSITIVE Sensitive     LEVOFLOXACIN 0.5 SENSITIVE Sensitive     PENICILLIN Value in next row Sensitive      SENSITIVE<=0.06    * STREPTOCOCCUS GROUP G  Blood Culture ID Panel (Reflexed)     Status: Abnormal   Collection Time: 04/17/21 11:58 AM  Result Value Ref Range Status   Enterococcus faecalis NOT DETECTED NOT DETECTED Final   Enterococcus Faecium NOT DETECTED NOT DETECTED Final   Listeria monocytogenes NOT DETECTED NOT DETECTED Final   Staphylococcus species NOT DETECTED NOT DETECTED Final   Staphylococcus aureus (BCID) NOT DETECTED NOT DETECTED Final   Staphylococcus epidermidis NOT DETECTED NOT  DETECTED Final   Staphylococcus lugdunensis NOT DETECTED NOT DETECTED Final   Streptococcus species DETECTED (A) NOT DETECTED Final    Comment: Not Enterococcus species, Streptococcus agalactiae, Streptococcus pyogenes, or Streptococcus pneumoniae. CRITICAL RESULT CALLED TO, READ BACK BY AND VERIFIED WITH: JASON ROBBINS ON 04/18/21 AT 0035 QSD    Streptococcus agalactiae NOT DETECTED NOT DETECTED Final   Streptococcus pneumoniae NOT DETECTED NOT DETECTED Final   Streptococcus pyogenes NOT DETECTED NOT DETECTED Final   A.calcoaceticus-baumannii NOT DETECTED NOT DETECTED Final   Bacteroides fragilis NOT DETECTED NOT DETECTED Final   Enterobacterales NOT DETECTED NOT DETECTED Final   Enterobacter cloacae complex NOT DETECTED NOT DETECTED Final   Escherichia coli NOT DETECTED NOT DETECTED Final   Klebsiella aerogenes NOT DETECTED NOT DETECTED Final   Klebsiella oxytoca NOT DETECTED NOT DETECTED Final   Klebsiella pneumoniae NOT DETECTED NOT DETECTED Final   Proteus species NOT DETECTED NOT DETECTED Final   Salmonella species NOT DETECTED NOT DETECTED Final   Serratia marcescens NOT DETECTED NOT DETECTED Final   Haemophilus influenzae NOT DETECTED NOT DETECTED Final   Neisseria meningitidis NOT DETECTED NOT DETECTED Final   Pseudomonas aeruginosa NOT DETECTED NOT DETECTED Final   Stenotrophomonas maltophilia NOT DETECTED NOT DETECTED Final   Candida albicans NOT DETECTED NOT DETECTED Final   Candida auris NOT DETECTED NOT DETECTED Final   Candida glabrata NOT DETECTED NOT DETECTED Final   Candida krusei NOT DETECTED NOT DETECTED Final   Candida parapsilosis NOT DETECTED NOT DETECTED Final   Candida tropicalis NOT DETECTED NOT DETECTED Final   Cryptococcus neoformans/gattii NOT DETECTED NOT DETECTED Final    Comment: Performed at [facility], [address] [place], [place] [address]  MRSA PCR Screening     Status: None   Collection Time: 04/17/21  3:46 PM   Specimen: Nasal  Mucosa; Nasopharyngeal  Result Value Ref Range Status   MRSA by PCR NEGATIVE NEGATIVE  Final    Comment:        The GeneXpert MRSA Assay (FDA approved for NASAL specimens only), is one component of a comprehensive MRSA colonization surveillance program. It is not intended to diagnose MRSA infection nor to guide or monitor treatment for MRSA infections. Performed at [facility], [address] [place], [place] [address]   Culture, blood (single) w Reflex to ID Panel     Status: None (Preliminary result)   Collection Time: 04/20/21  3:37 AM   Specimen: BLOOD  Result Value Ref Range Status   Specimen Description BLOOD LEFT HAND  Final   Special Requests   Final    BOTTLES DRAWN AEROBIC AND ANAEROBIC Blood Culture adequate volume   Culture   Final    NO GROWTH 1 DAY Performed at [facility], [address] [place], [place] [address]    Report Status PENDING  Incomplete         Radiology Studies: No results found.      Scheduled Meds: . amLODipine  10 mg Oral Daily  . aspirin  81 mg Oral Daily  . Chlorhexidine Gluconate Cloth  6 each Topical Q0600  . cholecalciferol  1,000 Units Oral Daily  . cloNIDine  0.3 mg Transdermal Weekly  . epoetin (EPOGEN/PROCRIT) injection  4,000 Units Intravenous Q M,W,F-HD  . feeding supplement  237 mL Oral TID BM  . heparin  5,000 Units Subcutaneous Q8H  . latanoprost  1 drop Both Eyes QHS  . letrozole  2.5 mg Oral Daily  . levETIRAcetam  500 mg Oral Daily  . levothyroxine  112 mcg Oral Q0600  . metoprolol succinate  50 mg Oral Daily  . multivitamin  1 tablet Oral QHS   Continuous Infusions: . sodium chloride 5 mL/hr at 04/22/21 0709  . ampicillin (OMNIPEN) IV 2 g (04/22/21 0857)     LOS: 13 days    Time spent: 30 mins     Wyvonnia Dusky, MD [facility] Pager [phone]  If 7PM-7AM, please contact night-coverage 04/22/2021, 12:37 PM

## 2021-04-23 LAB — BASIC METABOLIC PANEL
Anion gap: 12 (ref 5–15)
BUN: 34 mg/dL — ABNORMAL HIGH (ref 8–23)
CO2: 26 mmol/L (ref 22–32)
Calcium: 8.2 mg/dL — ABNORMAL LOW (ref 8.9–10.3)
Chloride: 98 mmol/L (ref 98–111)
Creatinine, Ser: 4.74 mg/dL — ABNORMAL HIGH (ref 0.44–1.00)
GFR, Estimated: 9 mL/min — ABNORMAL LOW (ref >60)
Glucose, Bld: 163 mg/dL — ABNORMAL HIGH (ref 70–99)
Potassium: 4 mmol/L (ref 3.5–5.1)
Sodium: 136 mmol/L (ref 135–145)

## 2021-04-23 LAB — CBC
HCT: 25 % — ABNORMAL LOW (ref 36.0–46.0)
Hemoglobin: 7.8 g/dL — ABNORMAL LOW (ref 12.0–15.0)
MCH: 28.3 pg (ref 26.0–34.0)
MCHC: 31.2 g/dL (ref 30.0–36.0)
MCV: 90.6 fL (ref 80.0–100.0)
Platelets: 299 10*3/uL (ref 150–400)
RBC: 2.76 MIL/uL — ABNORMAL LOW (ref 3.87–5.11)
RDW: 15.9 % — ABNORMAL HIGH (ref 11.5–15.5)
WBC: 12.7 10*3/uL — ABNORMAL HIGH (ref 4.0–10.5)
nRBC: 0 % (ref 0.0–0.2)

## 2021-04-23 LAB — GLUCOSE, CAPILLARY
Glucose-Capillary: 198 mg/dL — ABNORMAL HIGH (ref 70–99)
Glucose-Capillary: 184 mg/dL — ABNORMAL HIGH (ref 70–99)
Glucose-Capillary: 130 mg/dL — ABNORMAL HIGH (ref 70–99)

## 2021-04-23 MED ORDER — VANCOMYCIN HCL 500 MG/100ML IV SOLN
500.0000 mg | INTRAVENOUS | Status: DC
  Administered 2021-04-24: 500 mg via INTRAVENOUS
  Filled 2021-04-23: qty 100

## 2021-04-23 MED ORDER — VANCOMYCIN HCL 1250 MG/250ML IV SOLN
1250.0000 mg | Freq: Once | INTRAVENOUS | Status: AC
  Administered 2021-04-23: 1250 mg via INTRAVENOUS
  Filled 2021-04-23: qty 250

## 2021-04-23 NOTE — Care Management Important Message (Signed)
Important Message  Patient Details  Name: Kirsten Mcmahon MRN: [id number] Date of Birth: [birth year]   Medicare Important Message Given:  Yes     [name] 04/23/2021, 1:19 PM

## 2021-04-23 NOTE — Progress Notes (Signed)
Spoke with Dr Jimmye Norman in regards to pt lethargic state. Per report pt has been this way since returning from hemodialysis yesterday. Pt does respond  to verbal stimuli. VSS and NAD noted. Will hold all PO meds for now until more alert per Dr Jimmye Norman.

## 2021-04-23 NOTE — TOC Progression Note (Addendum)
Transition of Care [facility]) - Progression Note    Patient Details  Name: Kirsten Mcmahon MRN: [id number] Date of Birth: 1946/02/01  Transition of [facility]) CM/SW Contact  Candie Chroman, LCSW Phone Number: 04/23/2021, 9:50 AM  Clinical Narrative:  Updated Kem Boroughs, RN at [facility]. HD coordinator confirmed she received voicemail about IV abx at HD once discharged.  10:26 am: Patient can start at HD center tomorrow. Need to find out if patient can stand and pivot or if she requires hoyer lift. Patient will need a hoyer pad to take to the HD center if she requires lift. Sent secure chat to PT, OT, and RN to check. [facility] plans on picking patient up at discharge, taking her to their center to be evaluated, and then having therapists go home with her to evaluate DME needs. [name] checking to see latest they could pick her up if discharging today.  12:20 pm: No discharge today. [facility] and son aware. HD coordinator notified HD center.  12:43 pm: Plan to dialyze early in the morning so [facility] can pick her up around 12:30 if stable.  Expected Discharge Plan: [place] Barriers to Discharge: Continued Medical Work up  Expected Discharge Plan and Services Expected Discharge Plan: [place] arrangements for the past 2 months: Single Family Home                                       Social Determinants of Health (SDOH) Interventions    Readmission Risk Interventions Readmission Risk Prevention Plan 04/11/2021  Transportation Screening Complete  PCP or Specialist Appt within 3-5 Days Complete  HRI or [place] Complete  Social Work Consult for [place] Planning/Counseling Complete  Palliative Care Screening Not Applicable  Medication Review Press [employer]) Complete  Some recent data might be hidden

## 2021-04-23 NOTE — Progress Notes (Signed)
PROGRESS NOTE    Kirsten Mcmahon  IBB:[id number] DOB: [birth year] DOA: 04/09/2021 PCP: [name]  Assessment & Plan:   Principal Problem:   Acute renal failure superimposed on stage 3b chronic kidney disease ([place]) Active Problems:   Hypertension   Hypothyroidism   GERD (gastroesophageal reflux disease)   Anemia in chronic kidney disease (CKD)   Type II diabetes mellitus with renal manifestations ([place])   ESRD needing dialysis ([name])   Leukocytosis   Fever   Streptococcal sepsis, unspecified ([name])   Streptococcal bacteremia    ESRD: HD was initiated on 04/10/2021.  Will continue on HD MWF as per nephro. Nephro recs apprec   Streptococcal sepsis and bacteremia:  continue on IV ampicillin x 6 weeks as per ID.  No evidence of vegetation on 2D echo.  Pt's son declined TEE. Temp 100.2 overnight. Repeat blood cxs ordered as per ID   Likely metabolic encephalopathy/delirium: possibly underlying cognitive impairment. Likely close to her baseline   Hypokalemia: within normal limits today   Hypophosphatemia: will monitor intermittently   Left lower extremity edema: No evidence of DVT.  ACD: H&H are labile. S/p 1 unit pRBCS transfused on 04/11/21.  Seizure disorder: continue on keepra   HTN: continue on metoprolol, amlodipine, clonidine    DM2: continue on SSI w/ accuchecks   Dysphagia: continue on dysphagia II diet as per speech     DVT prophylaxis: heparin  Code Status: full  Family Communication: discussed pt's care w/ pt's son, Kirsten Mcmahon, and answered his questions Disposition Plan: likely d/c back home.  Level of care: Med-Surg   Status is: Inpatient  Remains inpatient appropriate because:Inpatient level of care appropriate due to severity of illness, temp of 100.2 overnight, repeat blood cxs ordered   Dispo: The patient is from: Home              Anticipated d/c is to: Home              Patient currently is not medically stable to d/c.    Difficult to place patient: unclear     Consultants:   nephro    Procedures:    Antimicrobials: ampicillin    Subjective: Pt c/o malaise   Objective: Vitals:   04/22/21 1948 04/22/21 2229 04/23/21 0500 04/23/21 0504  BP: 139/84 124/76  118/79  Pulse: (!) 112 (!) 110  (!) 102  Resp: 20 20  20   Temp: 99.1 F (37.3 C) 100.2 F (37.9 C)  98.7 F (37.1 C)  TempSrc:  Oral  Oral  SpO2: 100% 100%  100%  Weight:   57.3 kg   Height:        Intake/Output Summary (Last 24 hours) at 04/23/2021 0722 Last data filed at 04/23/2021 0604 Gross per 24 hour  Intake 127.15 ml  Output 463 ml  Net -335.85 ml   Filed Weights   04/21/21 0500 04/22/21 0549 04/23/21 0500  Weight: 56.2 kg 57.5 kg 57.3 kg    Examination:  General exam: Appears lethargic  Respiratory system: clear breath sounds b/l. No rales  Cardiovascular system: S1/S2+. No rubs or clicks  Gastrointestinal system: Abd is soft, NT, ND & hypoactive bowel sounds  Central nervous system: Lethargic. Moves all extremities  Psychiatry: Judgement and insight appear normal. Flat mood and affect     Data Reviewed: I have personally reviewed following labs and imaging studies  CBC: Recent Labs  Lab 04/17/21 0415 04/18/21 0408 04/19/21 0400 04/20/21 0337 04/22/21 1850 04/23/21 0511  WBC  29.3* 17.2* 13.6* 8.2  --  12.7*  NEUTROABS 26.6* 15.3* 11.2* 5.8  --   --   HGB 8.5* 7.9* 8.0* 8.6* 8.6* 7.8*  HCT 26.7* 24.7* 25.4* 26.5* 26.7* 25.0*  MCV 92.1 92.5 92.0 90.8  --  90.6  PLT 226 209 230 268  --  740   Basic Metabolic Panel: Recent Labs  Lab 04/16/21 1114 04/16/21 1210 04/16/21 1210 04/17/21 0415 04/18/21 0408 04/19/21 0400 04/20/21 0337 04/23/21 0511  NA  --  137   < > 138 138 139 136 136  K  --  3.1*   < > 3.5 3.6 3.6 3.6 4.0  CL  --  96*   < > 100 100 100 97* 98  CO2  --  27   < > 26 28 27 28 26   GLUCOSE  --  119*   < > 123* 185* 156* 157* 163*  BUN  --  <5*   < > 12 28* 24* 39* 34*  CREATININE   --  1.17*   < > 3.08* 4.89* 3.64* 5.26* 4.74*  CALCIUM  --  8.6*   < > 8.3* 8.1* 8.3* 8.2* 8.2*  MG 1.8  --   --   --  1.9  --  2.0  --   PHOS  --  1.4*  --  3.9 3.2  --  2.2*  --    < > = values in this interval not displayed.   GFR: Estimated Creatinine Clearance: 8 mL/min (A) (by C-G formula based on SCr of 4.74 mg/dL (H)). Liver Function Tests: Recent Labs  Lab 04/16/21 1210  ALBUMIN 3.2*   No results for input(s): LIPASE, AMYLASE in the last 168 hours. No results for input(s): AMMONIA in the last 168 hours. Coagulation Profile: No results for input(s): INR, PROTIME in the last 168 hours. Cardiac Enzymes: No results for input(s): CKTOTAL, CKMB, CKMBINDEX, TROPONINI in the last 168 hours. BNP (last 3 results) No results for input(s): PROBNP in the last 8760 hours. HbA1C: No results for input(s): HGBA1C in the last 72 hours. CBG: Recent Labs  Lab 04/22/21 1626 04/22/21 2235 04/23/21 0721  GLUCAP 150* 185* 198*   Lipid Profile: No results for input(s): CHOL, HDL, LDLCALC, TRIG, CHOLHDL, LDLDIRECT in the last 72 hours. Thyroid Function Tests: No results for input(s): TSH, T4TOTAL, FREET4, T3FREE, THYROIDAB in the last 72 hours. Anemia Panel: No results for input(s): VITAMINB12, FOLATE, FERRITIN, TIBC, IRON, RETICCTPCT in the last 72 hours. Sepsis Labs: Recent Labs  Lab 04/17/21 1158 04/17/21 1354 04/18/21 0408 04/19/21 0400  PROCALCITON 1.88  --  5.20 6.03  LATICACIDVEN 1.3 1.2  --   --     Recent Results (from the past 240 hour(s))  CULTURE, BLOOD (ROUTINE X 2) w Reflex to ID Panel     Status: Abnormal   Collection Time: 04/17/21 11:58 AM   Specimen: BLOOD  Result Value Ref Range Status   Specimen Description   Final    BLOOD BLOOD RIGHT ARM Performed at [facility], [address], [place], [place] [address]    Special Requests   Final    BOTTLES DRAWN AEROBIC AND ANAEROBIC Blood Culture adequate volume Performed at [facility], [address] [place], [place] [address]    Culture  Setup Time   Final    GRAM POSITIVE COCCI IN BOTH AEROBIC AND ANAEROBIC BOTTLES CRITICAL RESULT CALLED TO, READ BACK BY AND VERIFIED WITH: JASON ROBBINS ON 04/18/21 AT 1497  QSD Performed at [facility], [address] [place], [place] [address]    Culture (A)  Final    STREPTOCOCCUS GROUP G SUSCEPTIBILITIES PERFORMED ON PREVIOUS CULTURE WITHIN THE LAST 5 DAYS. Performed at [facility], [place] [address], [place], [place] [address]    Report Status 04/20/2021 FINAL  Final  CULTURE, BLOOD (ROUTINE X 2) w Reflex to ID Panel     Status: Abnormal   Collection Time: 04/17/21 11:58 AM   Specimen: BLOOD  Result Value Ref Range Status   Specimen Description   Final    BLOOD BLOOD RIGHT HAND Performed at [facility], [address] [place], [place] [address]    Special Requests   Final    BOTTLES DRAWN AEROBIC AND ANAEROBIC Blood Culture adequate volume Performed at [facility], [address], [place], [place] [address]    Culture  Setup Time   Final    GRAM POSITIVE COCCI IN BOTH AEROBIC AND ANAEROBIC BOTTLES CRITICAL RESULT CALLED TO, READ BACK BY AND VERIFIED WITH: JASON ROBBINS ON 04/18/21 AT 0035 QSD Performed at [facility], [place] [address] [place],  [address]    Culture STREPTOCOCCUS GROUP G (A)  Final   Report Status 04/20/2021 FINAL  Final   Organism ID, Bacteria STREPTOCOCCUS GROUP G  Final      Susceptibility   Streptococcus group g - MIC*    CLINDAMYCIN RESISTANT Resistant     AMPICILLIN <=0.25 SENSITIVE Sensitive     VANCOMYCIN 0.5 SENSITIVE Sensitive     CEFTRIAXONE <=0.12 SENSITIVE Sensitive     LEVOFLOXACIN 0.5 SENSITIVE Sensitive     PENICILLIN Value in next row Sensitive      SENSITIVE<=0.06    * STREPTOCOCCUS GROUP G  Blood Culture ID Panel (Reflexed)     Status: Abnormal   Collection Time: 04/17/21 11:58 AM  Result Value Ref Range Status   Enterococcus  faecalis NOT DETECTED NOT DETECTED Final   Enterococcus Faecium NOT DETECTED NOT DETECTED Final   Listeria monocytogenes NOT DETECTED NOT DETECTED Final   Staphylococcus species NOT DETECTED NOT DETECTED Final   Staphylococcus aureus (BCID) NOT DETECTED NOT DETECTED Final   Staphylococcus epidermidis NOT DETECTED NOT DETECTED Final   Staphylococcus lugdunensis NOT DETECTED NOT DETECTED Final   Streptococcus species DETECTED (A) NOT DETECTED Final    Comment: Not Enterococcus species, Streptococcus agalactiae, Streptococcus pyogenes, or Streptococcus pneumoniae. CRITICAL RESULT CALLED TO, READ BACK BY AND VERIFIED WITH: JASON ROBBINS ON 04/18/21 AT 0035 QSD    Streptococcus agalactiae NOT DETECTED NOT DETECTED Final   Streptococcus pneumoniae NOT DETECTED NOT DETECTED Final   Streptococcus pyogenes NOT DETECTED NOT DETECTED Final   A.calcoaceticus-baumannii NOT DETECTED NOT DETECTED Final   Bacteroides fragilis NOT DETECTED NOT DETECTED Final   Enterobacterales NOT DETECTED NOT DETECTED Final   Enterobacter cloacae complex NOT DETECTED NOT DETECTED Final   Escherichia coli NOT DETECTED NOT DETECTED Final   Klebsiella aerogenes NOT DETECTED NOT DETECTED Final   Klebsiella oxytoca NOT DETECTED NOT DETECTED Final   Klebsiella pneumoniae NOT DETECTED NOT DETECTED Final   Proteus species NOT DETECTED NOT DETECTED Final   Salmonella species NOT DETECTED NOT DETECTED Final   Serratia marcescens NOT DETECTED NOT DETECTED Final   Haemophilus influenzae NOT DETECTED NOT DETECTED Final   Neisseria meningitidis NOT DETECTED NOT DETECTED Final   Pseudomonas aeruginosa NOT DETECTED NOT DETECTED Final   Stenotrophomonas maltophilia NOT DETECTED NOT DETECTED Final   Candida albicans  NOT DETECTED NOT DETECTED Final   Candida auris NOT DETECTED NOT DETECTED Final   Candida glabrata NOT DETECTED NOT DETECTED Final   Candida krusei NOT DETECTED NOT DETECTED Final   Candida parapsilosis NOT DETECTED NOT  DETECTED Final   Candida tropicalis NOT DETECTED NOT DETECTED Final   Cryptococcus neoformans/gattii NOT DETECTED NOT DETECTED Final    Comment: Performed at [facility], [address] [place], [place] [address]  MRSA PCR Screening     Status: None   Collection Time: 04/17/21  3:46 PM   Specimen: Nasal Mucosa; Nasopharyngeal  Result Value Ref Range Status   MRSA by PCR NEGATIVE NEGATIVE Final    Comment:        The GeneXpert MRSA Assay (FDA approved for NASAL specimens only), is one component of a comprehensive MRSA colonization surveillance program. It is not intended to diagnose MRSA infection nor to guide or monitor treatment for MRSA infections. Performed at [facility], [address] [place], [place] [address]   Culture, blood (single) w Reflex to ID Panel     Status: None (Preliminary result)   Collection Time: 04/20/21  3:37 AM   Specimen: BLOOD  Result Value Ref Range Status   Specimen Description BLOOD LEFT HAND  Final   Special Requests   Final    BOTTLES DRAWN AEROBIC AND ANAEROBIC Blood Culture adequate volume   Culture   Final    NO GROWTH 3 DAYS Performed at [facility], [address] [place], [place] [address]    Report Status PENDING  Incomplete         Radiology Studies: No results found.      Scheduled Meds: . amLODipine  10 mg Oral Daily  . aspirin  81 mg Oral Daily  . Chlorhexidine Gluconate Cloth  6 each Topical Q0600  . cholecalciferol  1,000 Units Oral Daily  . cloNIDine  0.3 mg Transdermal Weekly  . epoetin (EPOGEN/PROCRIT) injection  4,000 Units Intravenous Q M,W,F-HD  . feeding supplement  237 mL Oral TID BM  . heparin  5,000 Units Subcutaneous Q8H  . insulin aspart  0-6 Units Subcutaneous TID WC  . latanoprost  1 drop Both Eyes QHS  . letrozole  2.5 mg Oral Daily  . levETIRAcetam  500 mg Oral Daily  . levothyroxine  112 mcg Oral Q0600  . metoprolol succinate  50 mg Oral Daily  . multivitamin   1 tablet Oral QHS   Continuous Infusions: . sodium chloride 5 mL/hr at 04/22/21 0918  . ampicillin (OMNIPEN) IV 2 g (04/22/21 2239)     LOS: 14 days    Time spent: 31 mins     Wyvonnia Dusky, MD [facility] Pager [phone]  If 7PM-7AM, please contact night-coverage 04/23/2021, 7:22 AM

## 2021-04-23 NOTE — Progress Notes (Addendum)
[name]  ROUNDING NOTE   Subjective:   Patient seen laying in bed Slow to respond today Breakfast at bedside No complaints at this time  Objective:  Vital signs in last 24 hours:  Temp:  [98.2 F (36.8 C)-100.2 F (37.9 C)] 99.6 F (37.6 C) (05/19 0747) Pulse Rate:  [102-117] 105 (05/19 0747) Resp:  [17-26] 17 (05/19 0747) BP: (118-144)/(73-93) 143/83 (05/19 0747) SpO2:  [99 %-100 %] 100 % (05/19 0747) Weight:  [57.3 kg] 57.3 kg (05/19 0500)  Weight change: -0.181 kg Filed Weights   04/21/21 0500 04/22/21 0549 04/23/21 0500  Weight: 56.2 kg 57.5 kg 57.3 kg    Intake/Output: I/O last 3 completed shifts: In: 389.8 [I.V.:189.8; IV Piggyback:200] Out: 463 [Other:463]   Intake/Output this shift:  No intake/output data recorded.  Physical Exam: General: NAD, resting in bed  Head: Normocephalic, atraumatic. Moist oral mucosal membranes  Eyes: Anicteric  Lungs:  Diminished in bases  Heart: Regular rhythm and rate  Abdomen:  Soft, nontender,  distended  Extremities:  no peripheral edema.  Neurologic: Alert and oriented to self  Skin: No lesions  Access: RIJ permcath 5/6 Dr. Lucky Cowboy    Basic Metabolic Panel: Recent Labs  Lab 04/16/21 1114 04/16/21 1210 04/16/21 1210 04/17/21 0415 04/18/21 0408 04/19/21 0400 04/20/21 0337 04/23/21 0511  NA  --  137   < > 138 138 139 136 136  K  --  3.1*   < > 3.5 3.6 3.6 3.6 4.0  CL  --  96*   < > 100 100 100 97* 98  CO2  --  27   < > 26 28 27 28 26   GLUCOSE  --  119*   < > 123* 185* 156* 157* 163*  BUN  --  <5*   < > 12 28* 24* 39* 34*  CREATININE  --  1.17*   < > 3.08* 4.89* 3.64* 5.26* 4.74*  CALCIUM  --  8.6*   < > 8.3* 8.1* 8.3* 8.2* 8.2*  MG 1.8  --   --   --  1.9  --  2.0  --   PHOS  --  1.4*  --  3.9 3.2  --  2.2*  --    < > = values in this interval not displayed.    Liver Function Tests: Recent Labs  Lab 04/16/21 1210  ALBUMIN 3.2*   No results for input(s): LIPASE, AMYLASE in the last 168  hours. No results for input(s): AMMONIA in the last 168 hours.  CBC: Recent Labs  Lab 04/17/21 0415 04/18/21 0408 04/19/21 0400 04/20/21 0337 04/22/21 1850 04/23/21 0511  WBC 29.3* 17.2* 13.6* 8.2  --  12.7*  NEUTROABS 26.6* 15.3* 11.2* 5.8  --   --   HGB 8.5* 7.9* 8.0* 8.6* 8.6* 7.8*  HCT 26.7* 24.7* 25.4* 26.5* 26.7* 25.0*  MCV 92.1 92.5 92.0 90.8  --  90.6  PLT 226 209 230 268  --  299    Cardiac Enzymes: No results for input(s): CKTOTAL, CKMB, CKMBINDEX, TROPONINI in the last 168 hours.  BNP: Invalid input(s): POCBNP  CBG: Recent Labs  Lab 04/22/21 1626 04/22/21 2235 04/23/21 0721  GLUCAP 150* 185* 198*    Microbiology: Results for orders placed or performed during the hospital encounter of 04/09/21  Resp Panel by RT-PCR (Flu A&B, Covid) Nasopharyngeal Swab     Status: None   Collection Time: 04/09/21  5:58 PM   Specimen: Nasopharyngeal Swab; Nasopharyngeal(NP) swabs in vial transport  medium  Result Value Ref Range Status   SARS Coronavirus 2 by RT PCR NEGATIVE NEGATIVE Final    Comment: (NOTE) SARS-CoV-2 target nucleic acids are NOT DETECTED.  The SARS-CoV-2 RNA is generally detectable in upper respiratory specimens during the acute phase of infection. The lowest concentration of SARS-CoV-2 viral copies this assay can detect is 138 copies/mL. A negative result does not preclude SARS-Cov-2 infection and should not be used as the sole basis for treatment or other patient management decisions. A negative result may occur with  improper specimen collection/handling, submission of specimen other than nasopharyngeal swab, presence of viral mutation(s) within the areas targeted by this assay, and inadequate number of viral copies(<138 copies/mL). A negative result must be combined with clinical observations, patient history, and epidemiological information. The expected result is Negative.  Fact Sheet for Patients:   EntrepreneurPulse.com.au  Fact Sheet for Healthcare Providers:  IncredibleEmployment.be  This test is no t yet approved or cleared by the [place] FDA and  has been authorized for detection and/or diagnosis of SARS-CoV-2 by FDA under an Emergency Use Authorization (EUA). This EUA will remain  in effect (meaning this test can be used) for the duration of the COVID-19 declaration under Section 564(b)(1) of the Act, 21 U.S.C.section 360bbb-3(b)(1), unless the authorization is terminated  or revoked sooner.       Influenza A by PCR NEGATIVE NEGATIVE Final   Influenza B by PCR NEGATIVE NEGATIVE Final    Comment: (NOTE) The Xpert Xpress SARS-CoV-2/FLU/RSV plus assay is intended as an aid in the diagnosis of influenza from Nasopharyngeal swab specimens and should not be used as a sole basis for treatment. Nasal washings and aspirates are unacceptable for Xpert Xpress SARS-CoV-2/FLU/RSV testing.  Fact Sheet for Patients: EntrepreneurPulse.com.au  Fact Sheet for Healthcare Providers: IncredibleEmployment.be  This test is not yet approved or cleared by the [place] FDA and has been authorized for detection and/or diagnosis of SARS-CoV-2 by FDA under an Emergency Use Authorization (EUA). This EUA will remain in effect (meaning this test can be used) for the duration of the COVID-19 declaration under Section 564(b)(1) of the Act, 21 U.S.C. section 360bbb-3(b)(1), unless the authorization is terminated or revoked.  Performed at [facility], [address] [place], [place] [address]   CULTURE, BLOOD (ROUTINE X 2) w Reflex to ID Panel     Status: Abnormal   Collection Time: 04/17/21 11:58 AM   Specimen: BLOOD  Result Value Ref Range Status   Specimen Description   Final    BLOOD BLOOD RIGHT ARM Performed at [facility], [address] [place], [place] [address]    Special  Requests   Final    BOTTLES DRAWN AEROBIC AND ANAEROBIC Blood Culture adequate volume Performed at [facility], [address] [place], [place] [address]    Culture  Setup Time   Final    GRAM POSITIVE COCCI IN BOTH AEROBIC AND ANAEROBIC BOTTLES CRITICAL RESULT CALLED TO, READ BACK BY AND VERIFIED WITH: JASON ROBBINS ON 04/18/21 AT 0035 QSD Performed at [facility], [address] [place], [place] [address]    Culture (A)  Final    STREPTOCOCCUS GROUP G SUSCEPTIBILITIES PERFORMED ON PREVIOUS CULTURE WITHIN THE LAST 5 DAYS. Performed at [facility], [place] [address] [place], [place] [address]    Report Status 04/20/2021 FINAL  Final  CULTURE, BLOOD (ROUTINE X 2) w Reflex to ID Panel     Status: Abnormal   Collection  Time: 04/17/21 11:58 AM   Specimen: BLOOD  Result Value Ref Range Status   Specimen Description   Final    BLOOD BLOOD RIGHT HAND Performed at [facility], [address] [place], [place] [address]    Special Requests   Final    BOTTLES DRAWN AEROBIC AND ANAEROBIC Blood Culture adequate volume Performed at [facility], [address] [place], [place] [address]    Culture  Setup Time   Final    GRAM POSITIVE COCCI IN BOTH AEROBIC AND ANAEROBIC BOTTLES CRITICAL RESULT CALLED TO, READ BACK BY AND VERIFIED WITH: JASON ROBBINS ON 04/18/21 AT 8676 QSD Performed at [facility], [address], [place], [place] [address]    Culture STREPTOCOCCUS GROUP G (A)  Final   Report Status 04/20/2021 FINAL  Final   Organism ID, Bacteria STREPTOCOCCUS GROUP G  Final      Susceptibility   Streptococcus group g - MIC*    CLINDAMYCIN RESISTANT Resistant     AMPICILLIN <=0.25 SENSITIVE Sensitive     VANCOMYCIN 0.5 SENSITIVE Sensitive     CEFTRIAXONE <=0.12 SENSITIVE Sensitive     LEVOFLOXACIN 0.5 SENSITIVE Sensitive     PENICILLIN Value in next row Sensitive      SENSITIVE<=0.06    * STREPTOCOCCUS GROUP G  Blood Culture ID  Panel (Reflexed)     Status: Abnormal   Collection Time: 04/17/21 11:58 AM  Result Value Ref Range Status   Enterococcus faecalis NOT DETECTED NOT DETECTED Final   Enterococcus Faecium NOT DETECTED NOT DETECTED Final   Listeria monocytogenes NOT DETECTED NOT DETECTED Final   Staphylococcus species NOT DETECTED NOT DETECTED Final   Staphylococcus aureus (BCID) NOT DETECTED NOT DETECTED Final   Staphylococcus epidermidis NOT DETECTED NOT DETECTED Final   Staphylococcus lugdunensis NOT DETECTED NOT DETECTED Final   Streptococcus species DETECTED (A) NOT DETECTED Final    Comment: Not Enterococcus species, Streptococcus agalactiae, Streptococcus pyogenes, or Streptococcus pneumoniae. CRITICAL RESULT CALLED TO, READ BACK BY AND VERIFIED WITH: JASON ROBBINS ON 04/18/21 AT 0035 QSD    Streptococcus agalactiae NOT DETECTED NOT DETECTED Final   Streptococcus pneumoniae NOT DETECTED NOT DETECTED Final   Streptococcus pyogenes NOT DETECTED NOT DETECTED Final   A.calcoaceticus-baumannii NOT DETECTED NOT DETECTED Final   Bacteroides fragilis NOT DETECTED NOT DETECTED Final   Enterobacterales NOT DETECTED NOT DETECTED Final   Enterobacter cloacae complex NOT DETECTED NOT DETECTED Final   Escherichia coli NOT DETECTED NOT DETECTED Final   Klebsiella aerogenes NOT DETECTED NOT DETECTED Final   Klebsiella oxytoca NOT DETECTED NOT DETECTED Final   Klebsiella pneumoniae NOT DETECTED NOT DETECTED Final   Proteus species NOT DETECTED NOT DETECTED Final   Salmonella species NOT DETECTED NOT DETECTED Final   Serratia marcescens NOT DETECTED NOT DETECTED Final   Haemophilus influenzae NOT DETECTED NOT DETECTED Final   Neisseria meningitidis NOT DETECTED NOT DETECTED Final   Pseudomonas aeruginosa NOT DETECTED NOT DETECTED Final   Stenotrophomonas maltophilia NOT DETECTED NOT DETECTED Final   Candida albicans NOT DETECTED NOT DETECTED Final   Candida auris NOT DETECTED NOT DETECTED Final   Candida glabrata  NOT DETECTED NOT DETECTED Final   Candida krusei NOT DETECTED NOT DETECTED Final   Candida parapsilosis NOT DETECTED NOT DETECTED Final   Candida tropicalis NOT DETECTED NOT DETECTED Final   Cryptococcus neoformans/gattii NOT DETECTED NOT DETECTED Final    Comment: Performed at [facility], [address], [place], [place] [address]  MRSA  PCR Screening     Status: None   Collection Time: 04/17/21  3:46 PM   Specimen: Nasal Mucosa; Nasopharyngeal  Result Value Ref Range Status   MRSA by PCR NEGATIVE NEGATIVE Final    Comment:        The GeneXpert MRSA Assay (FDA approved for NASAL specimens only), is one component of a comprehensive MRSA colonization surveillance program. It is not intended to diagnose MRSA infection nor to guide or monitor treatment for MRSA infections. Performed at [facility], [address] [place], [place] [address]   Culture, blood (single) w Reflex to ID Panel     Status: None (Preliminary result)   Collection Time: 04/20/21  3:37 AM   Specimen: BLOOD  Result Value Ref Range Status   Specimen Description BLOOD LEFT HAND  Final   Special Requests   Final    BOTTLES DRAWN AEROBIC AND ANAEROBIC Blood Culture adequate volume   Culture   Final    NO GROWTH 3 DAYS Performed at [facility], [address] [place], [place] [address]    Report Status PENDING  Incomplete    Coagulation Studies: No results for input(s): LABPROT, INR in the last 72 hours.  Urinalysis: No results for input(s): COLORURINE, LABSPEC, PHURINE, GLUCOSEU, HGBUR, BILIRUBINUR, KETONESUR, PROTEINUR, UROBILINOGEN, NITRITE, LEUKOCYTESUR in the last 72 hours.  Invalid input(s): APPERANCEUR    Imaging: No results found.   Medications:   . sodium chloride 5 mL/hr at 04/22/21 0918  . ampicillin (OMNIPEN) IV 2 g (04/23/21 1018)   . amLODipine  10 mg Oral Daily  . aspirin  81 mg Oral Daily  . Chlorhexidine Gluconate Cloth  6 each Topical Q0600  .  cholecalciferol  1,000 Units Oral Daily  . cloNIDine  0.3 mg Transdermal Weekly  . epoetin (EPOGEN/PROCRIT) injection  4,000 Units Intravenous Q M,W,F-HD  . feeding supplement  237 mL Oral TID BM  . heparin  5,000 Units Subcutaneous Q8H  . insulin aspart  0-6 Units Subcutaneous TID WC  . latanoprost  1 drop Both Eyes QHS  . letrozole  2.5 mg Oral Daily  . levETIRAcetam  500 mg Oral Daily  . levothyroxine  112 mcg Oral Q0600  . metoprolol succinate  50 mg Oral Daily  . multivitamin  1 tablet Oral QHS   sodium chloride, acetaminophen **OR** acetaminophen, calcium carbonate, camphor-menthol **AND** hydrOXYzine, docusate sodium, hydrALAZINE, LORazepam, ondansetron **OR** ondansetron (ZOFRAN) IV, sorbitol  Assessment/ Plan:  Ms. MAXINE FREDMAN is a 75 y.o.  female  with past medical history of GERD, Hypertension, diabetes, hyperlipidemia, CVA and CKD stage 4. She presents to the ED with increased lower extremity edema. She is known to our team from previous admissions.   1. End Stage Renal Disease requiring new start hemodialysis:  First dialysis treatment 5/6.   - Received dialysis yesterday - treatment ended 1 hour early d/t clotting. Permcath remains patent. - UF 463 ml acheived - Next treatment scheduled for Friday - Blood cultures positive for group G strep and started on Ceftriaxone.  - HD cath believed to be unlikely source of infection - Will require 6 weeks IV vancomycin 500mg  with each dialysis treatment -  Fever overnight, will plan discharge tomorrow after HD - Outpatient planning: Dialysis liaison notified of outpatient center acceptance at [facility], MWF, 1115. Hoyer pad recommended for outpatient clinic. Clinic notified   2. Anemia of chronic kidney disease  Lab Results  Component Value Date   HGB 7.8 (L) 04/23/2021  EPO with treatments   3. Secondary Hyperparathyroidism: with hypocalcemia   Lab Results  Component Value Date   PTH 161 (H) 01/18/2021    CALCIUM 8.2 (L) 04/23/2021   PHOS 2.2 (L) 04/20/2021  Phosphorus below target Oral intake improving  4.Diabetes mellitus type II with chronic kidney disease noninsulin dependent.  hemoglobin A1c 5.7 on 01/18/21.  Stable   5. Hypertension: Home regimen of clonidine, amlodipine, metoprolol continued Stable at this time   LOS: 14   5/19/202210:22 AM

## 2021-04-23 NOTE — Progress Notes (Signed)
SLP Cancellation Note  Patient Details Name: Kirsten Mcmahon MRN: [id number] DOB: [birth year]   Cancelled treatment:       Reason Eval/Treat Not Completed: Fatigue/lethargy limiting ability to participate (chart reviewed). Per NSG report today, meals and even po meds have been held d/t pt's increased drowsiness and lethargy. NSG stated she felt pt would be at increased risk for aspiration if given po's earlier today d/t the drowsiness. Pt has exhibited increased drowsiness since yesterday PM post HD per NSG report.  Recommend aspiration precautions w/ oral intake. Recommend continued w/ current modified diet IF pt is awake/alert to safely engage in taking po's w/ NSG. ST services will f/u tomorrow w/ pt's status and toleration of diet. NSG agreed.     Orinda Kenner, MS, CCC-SLP Speech Language Pathologist Rehab Services [phone] [facility] 04/23/2021, 3:50 PM

## 2021-04-23 NOTE — Progress Notes (Signed)
[facility] INFECTIOUS DISEASE PROGRESS NOTE Date of Admission:  04/09/2021     ID: Kirsten Mcmahon is a 75 y.o. female with Strep bacteremia Principal Problem:   Acute renal failure superimposed on stage 3b chronic kidney disease (HCC) Active Problems:   Hypertension   Hypothyroidism   GERD (gastroesophageal reflux disease)   Anemia in chronic kidney disease (CKD)   Type II diabetes mellitus with renal manifestations ([facility])   ESRD needing dialysis ([place])   Leukocytosis   Fever   Streptococcal sepsis, unspecified (HCC)   Streptococcal bacteremia   Subjective: Tm 100.2. wbc up a little. Repeat bcx done.  More lethargic today.  ROS  Eleven systems are reviewed and negative except per hpi  Medications:  Antibiotics Given (last 72 hours)    Date/Time Action Medication Dose Rate   04/21/21 0530 New Bag/Given   ampicillin (OMNIPEN) 2 g in sodium chloride 0.9 % 100 mL IVPB 2 g 300 mL/hr   04/21/21 2230 New Bag/Given   ampicillin (OMNIPEN) 2 g in sodium chloride 0.9 % 100 mL IVPB 2 g 300 mL/hr   04/22/21 0857 New Bag/Given   ampicillin (OMNIPEN) 2 g in sodium chloride 0.9 % 100 mL IVPB 2 g 300 mL/hr   04/22/21 2239 New Bag/Given   ampicillin (OMNIPEN) 2 g in sodium chloride 0.9 % 100 mL IVPB 2 g 300 mL/hr   04/23/21 1018 New Bag/Given   ampicillin (OMNIPEN) 2 g in sodium chloride 0.9 % 100 mL IVPB 2 g 300 mL/hr   04/23/21 1416 New Bag/Given   vancomycin (VANCOREADY) IVPB 1250 mg/250 mL 1,250 mg 166.7 mL/hr     . amLODipine  10 mg Oral Daily  . aspirin  81 mg Oral Daily  . Chlorhexidine Gluconate Cloth  6 each Topical Q0600  . cholecalciferol  1,000 Units Oral Daily  . cloNIDine  0.3 mg Transdermal Weekly  . epoetin (EPOGEN/PROCRIT) injection  4,000 Units Intravenous Q M,W,F-HD  . feeding supplement  237 mL Oral TID BM  . heparin  5,000 Units Subcutaneous Q8H  . insulin aspart  0-6 Units Subcutaneous TID WC  . latanoprost  1 drop Both Eyes QHS  . letrozole  2.5 mg Oral  Daily  . levETIRAcetam  500 mg Oral Daily  . levothyroxine  112 mcg Oral Q0600  . metoprolol succinate  50 mg Oral Daily  . multivitamin  1 tablet Oral QHS    Objective: Vital signs in last 24 hours: Temp:  [98.3 F (36.8 C)-100.2 F (37.9 C)] 98.8 F (37.1 C) (05/19 1134) Pulse Rate:  [102-116] 108 (05/19 1134) Resp:  [17-26] 20 (05/19 1133) BP: (118-144)/(76-87) 140/87 (05/19 1133) SpO2:  [100 %] 100 % (05/19 1134) Weight:  [57.3 kg] 57.3 kg (05/19 0500) Constitutional: lethargic but arousable  Appears chronically ill. HENT: [place]/AT, PERRLA, no scleral icterus Mouth/Throat: Oropharynx is clear and moist. No oropharyngeal exudate.  Cardiovascular: Normal rate, regular rhythm and normal heart sounds. Exam reveals no gallop and no friction rub.  No murmur heard.  Pulmonary/Chest: Effort normal and breath sounds normal. No respiratory distress.  has no wheezes.  Neck = supple, no nuchal rigidity Right IJ hemodialysis catheter intact appears intact. Abdominal: Soft. Bowel sounds are normal.  exhibits no distension. There is no tenderness.  Lymphadenopathy: no cervical adenopathy. No axillary adenopathy Neurological:lethargic but arousable.  Skin: Skin is warm and dry. No rash noted. No erythema.   Lab Results Recent Labs    04/22/21 1850 04/23/21 0511  WBC  --  12.7*  HGB 8.6* 7.8*  HCT 26.7* 25.0*  NA  --  136  K  --  4.0  CL  --  98  CO2  --  26  BUN  --  34*  CREATININE  --  4.74*    Microbiology: Results for orders placed or performed during the hospital encounter of 04/09/21  Resp Panel by RT-PCR (Flu A&B, Covid) Nasopharyngeal Swab     Status: None   Collection Time: 04/09/21  5:58 PM   Specimen: Nasopharyngeal Swab; Nasopharyngeal(NP) swabs in vial transport medium  Result Value Ref Range Status   SARS Coronavirus 2 by RT PCR NEGATIVE NEGATIVE Final    Comment: (NOTE) SARS-CoV-2 target nucleic acids are NOT DETECTED.  The SARS-CoV-2 RNA is generally  detectable in upper respiratory specimens during the acute phase of infection. The lowest concentration of SARS-CoV-2 viral copies this assay can detect is 138 copies/mL. A negative result does not preclude SARS-Cov-2 infection and should not be used as the sole basis for treatment or other patient management decisions. A negative result may occur with  improper specimen collection/handling, submission of specimen other than nasopharyngeal swab, presence of viral mutation(s) within the areas targeted by this assay, and inadequate number of viral copies(<138 copies/mL). A negative result must be combined with clinical observations, patient history, and epidemiological information. The expected result is Negative.  Fact Sheet for Patients:  EntrepreneurPulse.com.au  Fact Sheet for Healthcare Providers:  IncredibleEmployment.be  This test is no t yet approved or cleared by the [place] FDA and  has been authorized for detection and/or diagnosis of SARS-CoV-2 by FDA under an Emergency Use Authorization (EUA). This EUA will remain  in effect (meaning this test can be used) for the duration of the COVID-19 declaration under Section 564(b)(1) of the Act, 21 U.S.C.section 360bbb-3(b)(1), unless the authorization is terminated  or revoked sooner.       Influenza A by PCR NEGATIVE NEGATIVE Final   Influenza B by PCR NEGATIVE NEGATIVE Final    Comment: (NOTE) The Xpert Xpress SARS-CoV-2/FLU/RSV plus assay is intended as an aid in the diagnosis of influenza from Nasopharyngeal swab specimens and should not be used as a sole basis for treatment. Nasal washings and aspirates are unacceptable for Xpert Xpress SARS-CoV-2/FLU/RSV testing.  Fact Sheet for Patients: EntrepreneurPulse.com.au  Fact Sheet for Healthcare Providers: IncredibleEmployment.be  This test is not yet approved or cleared by the [place] FDA  and has been authorized for detection and/or diagnosis of SARS-CoV-2 by FDA under an Emergency Use Authorization (EUA). This EUA will remain in effect (meaning this test can be used) for the duration of the COVID-19 declaration under Section 564(b)(1) of the Act, 21 U.S.C. section 360bbb-3(b)(1), unless the authorization is terminated or revoked.  Performed at [facility], [address] [place], [place] [address]   CULTURE, BLOOD (ROUTINE X 2) w Reflex to ID Panel     Status: Abnormal   Collection Time: 04/17/21 11:58 AM   Specimen: BLOOD  Result Value Ref Range Status   Specimen Description   Final    BLOOD BLOOD RIGHT ARM Performed at [facility], [address] [place], [place] [address]    Special Requests   Final    BOTTLES DRAWN AEROBIC AND ANAEROBIC Blood Culture adequate volume Performed at [facility], [address] [place], [place] [address]    Culture  Setup Time   Final    GRAM POSITIVE COCCI IN BOTH AEROBIC AND ANAEROBIC  BOTTLES CRITICAL RESULT CALLED TO, READ BACK BY AND VERIFIED WITH: JASON ROBBINS ON 04/18/21 AT 0035 QSD Performed at [facility], [address] [place], [place] [address]    Culture (A)  Final    STREPTOCOCCUS GROUP G SUSCEPTIBILITIES PERFORMED ON PREVIOUS CULTURE WITHIN THE LAST 5 DAYS. Performed at [facility], [place] [address], [place], [place] [address]    Report Status 04/20/2021 FINAL  Final  CULTURE, BLOOD (ROUTINE X 2) w Reflex to ID Panel     Status: Abnormal   Collection Time: 04/17/21 11:58 AM   Specimen: BLOOD  Result Value Ref Range Status   Specimen Description   Final    BLOOD BLOOD RIGHT HAND Performed at [facility], [address] [place], [place] [address]    Special Requests   Final    BOTTLES DRAWN AEROBIC AND ANAEROBIC Blood Culture adequate volume Performed at [facility], [address], [place], [place] [address]    Culture  Setup Time    Final    GRAM POSITIVE COCCI IN BOTH AEROBIC AND ANAEROBIC BOTTLES CRITICAL RESULT CALLED TO, READ BACK BY AND VERIFIED WITH: JASON ROBBINS ON 04/18/21 AT 0035 QSD Performed at [facility], [place] [address] [place], [place] [address]    Culture STREPTOCOCCUS GROUP G (A)  Final   Report Status 04/20/2021 FINAL  Final   Organism ID, Bacteria STREPTOCOCCUS GROUP G  Final      Susceptibility   Streptococcus group g - MIC*    CLINDAMYCIN RESISTANT Resistant     AMPICILLIN <=0.25 SENSITIVE Sensitive     VANCOMYCIN 0.5 SENSITIVE Sensitive     CEFTRIAXONE <=0.12 SENSITIVE Sensitive     LEVOFLOXACIN 0.5 SENSITIVE Sensitive     PENICILLIN Value in next row Sensitive      SENSITIVE<=0.06    * STREPTOCOCCUS GROUP G  Blood Culture ID Panel (Reflexed)     Status: Abnormal   Collection Time: 04/17/21 11:58 AM  Result Value Ref Range Status   Enterococcus faecalis NOT DETECTED NOT DETECTED Final   Enterococcus Faecium NOT DETECTED NOT DETECTED Final   Listeria monocytogenes NOT DETECTED NOT DETECTED Final   Staphylococcus species NOT DETECTED NOT DETECTED Final   Staphylococcus aureus (BCID) NOT DETECTED NOT DETECTED Final   Staphylococcus epidermidis NOT DETECTED NOT DETECTED Final   Staphylococcus lugdunensis NOT DETECTED NOT DETECTED Final   Streptococcus species DETECTED (A) NOT DETECTED Final    Comment: Not Enterococcus species, Streptococcus agalactiae, Streptococcus pyogenes, or Streptococcus pneumoniae. CRITICAL RESULT CALLED TO, READ BACK BY AND VERIFIED WITH: JASON ROBBINS ON 04/18/21 AT 0035 QSD    Streptococcus agalactiae NOT DETECTED NOT DETECTED Final   Streptococcus pneumoniae NOT DETECTED NOT DETECTED Final   Streptococcus pyogenes NOT DETECTED NOT DETECTED Final   A.calcoaceticus-baumannii NOT DETECTED NOT DETECTED Final   Bacteroides fragilis NOT DETECTED NOT DETECTED Final   Enterobacterales NOT DETECTED NOT DETECTED Final   Enterobacter cloacae complex NOT DETECTED  NOT DETECTED Final   Escherichia coli NOT DETECTED NOT DETECTED Final   Klebsiella aerogenes NOT DETECTED NOT DETECTED Final   Klebsiella oxytoca NOT DETECTED NOT DETECTED Final   Klebsiella pneumoniae NOT DETECTED NOT DETECTED Final   Proteus species NOT DETECTED NOT DETECTED Final   Salmonella species NOT DETECTED NOT DETECTED Final   Serratia marcescens NOT DETECTED NOT DETECTED Final   Haemophilus influenzae NOT DETECTED NOT DETECTED Final   Neisseria meningitidis NOT DETECTED NOT DETECTED Final   Pseudomonas aeruginosa  NOT DETECTED NOT DETECTED Final   Stenotrophomonas maltophilia NOT DETECTED NOT DETECTED Final   Candida albicans NOT DETECTED NOT DETECTED Final   Candida auris NOT DETECTED NOT DETECTED Final   Candida glabrata NOT DETECTED NOT DETECTED Final   Candida krusei NOT DETECTED NOT DETECTED Final   Candida parapsilosis NOT DETECTED NOT DETECTED Final   Candida tropicalis NOT DETECTED NOT DETECTED Final   Cryptococcus neoformans/gattii NOT DETECTED NOT DETECTED Final    Comment: Performed at [facility], [address] [place], [place] [address]  MRSA PCR Screening     Status: None   Collection Time: 04/17/21  3:46 PM   Specimen: Nasal Mucosa; Nasopharyngeal  Result Value Ref Range Status   MRSA by PCR NEGATIVE NEGATIVE Final    Comment:        The GeneXpert MRSA Assay (FDA approved for NASAL specimens only), is one component of a comprehensive MRSA colonization surveillance program. It is not intended to diagnose MRSA infection nor to guide or monitor treatment for MRSA infections. Performed at [facility], [address] [place], [place] [address]   Culture, blood (single) w Reflex to ID Panel     Status: None (Preliminary result)   Collection Time: 04/20/21  3:37 AM   Specimen: BLOOD  Result Value Ref Range Status   Specimen Description BLOOD LEFT HAND  Final   Special Requests   Final    BOTTLES DRAWN AEROBIC AND ANAEROBIC Blood  Culture adequate volume   Culture   Final    NO GROWTH 3 DAYS Performed at [facility], [address] [place], [place] [address]    Report Status PENDING  Incomplete    Studies/Results: No results found.  Assessment/Plan: Kirsten Mcmahon is a 75 y.o. female with history of progressive chronic kidney disease admitted with worsening edema and found to have acute on chronic kidney disease.  She had a  hemodialysis catheter placed May 6 and has started hemodialysis.  She was afebrile on admission but may 12-13 developed high fevers as well as leukocytosis.  Blood cultures were done and are growing group G  strep in 2 sets.   She was started on ceftriaxone and has defervesced.  White count had come down from 29,000 to 8000 .  She has had an echocardiogram with no evidence of vegetation. Unclear etiology of her bacteremia.  Group B strep can be part of oral flora as well as GI flora.  Could also be from her hemodialysis catheter but I think that would be an unusual pathogen for this.  Group G strep several propensity to cause endocarditis however.  5/17 - no fevers, fu bcx neg 5/16.  5/18 fu bcx neg, no fevers 5/19 low grade temp, more lethargic   Recommendations Can treat with 6 weeks IV vanco since cannot rule out endocarditis.  This will also provide coverage in case the HD cath was seeded while bacteremic. Will attempt to save line.  Cont vanco at HD until June 27th After abx stopped would check bcx at dialysis 10 days after last dose to ensure clearance of bacteremia as the cath was not removed.    Given low grade fevers today repeated bcx. If fevers persist would consider aspiration PNA or other etiologies.   ID will not be available in house for the next week. [facility] will cover remotely and can be contacted with questions.  Leonel Ramsay   04/23/2021, 3:46 PM

## 2021-04-24 LAB — CBC
HCT: 22.2 % — ABNORMAL LOW (ref 36.0–46.0)
Hemoglobin: 7.1 g/dL — ABNORMAL LOW (ref 12.0–15.0)
MCH: 29 pg (ref 26.0–34.0)
MCHC: 32 g/dL (ref 30.0–36.0)
MCV: 90.6 fL (ref 80.0–100.0)
Platelets: 348 10*3/uL (ref 150–400)
RBC: 2.45 MIL/uL — ABNORMAL LOW (ref 3.87–5.11)
RDW: 15.9 % — ABNORMAL HIGH (ref 11.5–15.5)
WBC: 10.8 10*3/uL — ABNORMAL HIGH (ref 4.0–10.5)
nRBC: 0 % (ref 0.0–0.2)

## 2021-04-24 LAB — BASIC METABOLIC PANEL
Anion gap: 13 (ref 5–15)
BUN: 49 mg/dL — ABNORMAL HIGH (ref 8–23)
CO2: 26 mmol/L (ref 22–32)
Calcium: 8.4 mg/dL — ABNORMAL LOW (ref 8.9–10.3)
Chloride: 99 mmol/L (ref 98–111)
Creatinine, Ser: 6.53 mg/dL — ABNORMAL HIGH (ref 0.44–1.00)
GFR, Estimated: 6 mL/min — ABNORMAL LOW (ref >60)
Glucose, Bld: 134 mg/dL — ABNORMAL HIGH (ref 70–99)
Potassium: 4.3 mmol/L (ref 3.5–5.1)
Sodium: 138 mmol/L (ref 135–145)

## 2021-04-24 LAB — GLUCOSE, CAPILLARY: Glucose-Capillary: 130 mg/dL — ABNORMAL HIGH (ref 70–99)

## 2021-04-24 LAB — PREPARE RBC (CROSSMATCH)

## 2021-04-24 MED ORDER — VANCOMYCIN HCL 500 MG/100ML IV SOLN: 500.0000 mg | INTRAVENOUS | 0 refills | Status: AC

## 2021-04-24 MED ORDER — SODIUM CHLORIDE 0.9% IV SOLUTION: Freq: Once | INTRAVENOUS | Status: AC

## 2021-04-24 NOTE — Consult Note (Signed)
Pharmacy Antibiotic Note  Kirsten Mcmahon is a 75 y.o. female with medical history including ESRD, diabetes, CVA, seizures, GERD, hypertension, hypothyroidism admitted on 04/09/2021 with renal failure. Admission complicated by encephalopathy / delirium in setting of underlying cognitive impairment. WBC count has been trending up for several days and patient spiked fever 5/12 evening to 101.8. Blood cultures with Group G streptococcus. Repeat Blood cultures are no growth  Patient transitioning to a MWF schedule as outpatient.   Plan: In preparation for discharge and IV antibiotics to be given at HD, change ampicillin to vancomycin   Patient given vancomycin loading dose 1250mg  x 1 5/19  Start vancomycin 500mg  IV today with HD and continue qHD on MWF for a total of 6 weeks of antibiotics with end date 06/01/2021. ID would like to repeat Blood cx on 06/11/2021   Height: 5\' 2"  (157.5 cm) Weight: 57.3 kg (126 lb 6.4 oz) IBW/kg (Calculated) : 50.1  Temp (24hrs), Avg:98.1 F (36.7 C), Min:97.6 F (36.4 C), Max:99.2 F (37.3 C)  Recent Labs  Lab 04/17/21 1354 04/18/21 0408 04/19/21 0400 04/20/21 0337 04/23/21 0511 04/24/21 0639  WBC  --  17.2* 13.6* 8.2 12.7* 10.8*  CREATININE  --  4.89* 3.64* 5.26* 4.74* 6.53*  LATICACIDVEN 1.2  --   --   --   --   --     Estimated Creatinine Clearance: 5.8 mL/min (A) (by C-G formula based on SCr of 6.53 mg/dL (H)).    Allergies  Allergen Reactions  . Contrast Media [Iodinated Diagnostic Agents]     Other reaction(s): NO ALLERGY  . Latex     Other reaction(s): NO ALLERGY  . Shellfish-Derived Products     Other reaction(s): NO ALLERGY  . Levemir [Insulin Detemir] Itching    Ceftriaxone 5/14 >> 5/16 Ampicillin 5/17 >>5/19 Vanco qhd 5/19 >> (6/27)   Thank you for allowing pharmacy to be a part of this patient's care.  Doreene Eland, PharmD, BCPS.   Work Cell: [phone] 04/24/2021 12:18 PM

## 2021-04-24 NOTE — TOC Transition Note (Signed)
Transition of Care [facility]) - CM/SW Discharge Note   Patient Details  Name: [name] MRN: [id number] Date of Birth: Jan 20, 1946  [facility] CM/SW Contact:  Candie Chroman, LCSW Phone Number: 04/24/2021, 3:27 PM   Clinical Narrative: Patient has orders to discharge home today. [facility] set up for 4:00 to go to son's home at [place], [place], [place] [address]. No further concerns. CSW signing off.    Final next level of care: Home/Self Care Barriers to Discharge: Barriers Resolved   Patient Goals and CMS Choice        Discharge Placement                Patient to be transferred to facility by: [facility] Name of family member notified: Catlyn Shipton Patient and family notified of of transfer: 04/24/21  Discharge Plan and Services                                     Social Determinants of Health (SDOH) Interventions     Readmission Risk Interventions Readmission Risk Prevention Plan 04/11/2021  Transportation Screening Complete  PCP or Specialist Appt within 3-5 Days Complete  HRI or Home Care Consult Complete  Social Work Consult for [place] Planning/Counseling Complete  Palliative Care Screening Not Applicable  Medication Review Press photographer) Complete  Some recent data might be hidden

## 2021-04-24 NOTE — Progress Notes (Addendum)
[name]  ROUNDING NOTE   Subjective:   Patient seen during dialysis   HEMODIALYSIS FLOWSHEET:  Blood Flow Rate (mL/min): 400 mL/min Arterial Pressure (mmHg): -160 mmHg Venous Pressure (mmHg): 130 mmHg Transmembrane Pressure (mmHg): 50 mmHg Ultrafiltration Rate (mL/min): 330 mL/min Dialysate Flow Rate (mL/min): 500 ml/min Conductivity: Machine : 13.9 Conductivity: Machine : 13.9 Dialysis Fluid Bolus: Normal Saline Bolus Amount (mL): 0 mL Dialysate Change: Other (comment) (3K, 2.5Ca start of treatment)  Tolerating well No complaints at this time  Objective:  Vital signs in last 24 hours:  Temp:  [97.6 F (36.4 C)-99.2 F (37.3 C)] 98 F (36.7 C) (05/20 1015) Pulse Rate:  [81-109] 92 (05/20 1200) Resp:  [15-22] 22 (05/20 1200) BP: (100-152)/(58-86) 117/76 (05/20 1200) SpO2:  [98 %-100 %] 100 % (05/20 1200)  Weight change:  Filed Weights   04/21/21 0500 04/22/21 0549 04/23/21 0500  Weight: 56.2 kg 57.5 kg 57.3 kg    Intake/Output: No intake/output data recorded.   Intake/Output this shift:  Total I/O In: 360 [Blood:360] Out: -   Physical Exam: General: NAD, resting in bed  Head: Normocephalic, atraumatic. Moist oral mucosal membranes  Eyes: Anicteric  Lungs:  Clear bilaterally, normal breathing effort  Heart: Regular rhythm and rate  Abdomen:  Soft, nontender,  Non-distended  Extremities:  no peripheral edema.  Neurologic: Alert and oriented to self  Skin: No lesions  Access: RIJ permcath 5/6 Dr. Lucky Cowboy    Basic Metabolic Panel: Recent Labs  Lab 04/18/21 0408 04/19/21 0400 04/20/21 0337 04/23/21 0511 04/24/21 0639  NA 138 139 136 136 138  K 3.6 3.6 3.6 4.0 4.3  CL 100 100 97* 98 99  CO2 28 27 28 26 26   GLUCOSE 185* 156* 157* 163* 134*  BUN 28* 24* 39* 34* 49*  CREATININE 4.89* 3.64* 5.26* 4.74* 6.53*  CALCIUM 8.1* 8.3* 8.2* 8.2* 8.4*  MG 1.9  --  2.0  --   --   PHOS 3.2  --  2.2*  --   --     Liver Function Tests: No  results for input(s): AST, ALT, ALKPHOS, BILITOT, PROT, ALBUMIN in the last 168 hours. No results for input(s): LIPASE, AMYLASE in the last 168 hours. No results for input(s): AMMONIA in the last 168 hours.  CBC: Recent Labs  Lab 04/18/21 0408 04/19/21 0400 04/20/21 0337 04/22/21 1850 04/23/21 0511 04/24/21 0639  WBC 17.2* 13.6* 8.2  --  12.7* 10.8*  NEUTROABS 15.3* 11.2* 5.8  --   --   --   HGB 7.9* 8.0* 8.6* 8.6* 7.8* 7.1*  HCT 24.7* 25.4* 26.5* 26.7* 25.0* 22.2*  MCV 92.5 92.0 90.8  --  90.6 90.6  PLT 209 230 268  --  299 348    Cardiac Enzymes: No results for input(s): CKTOTAL, CKMB, CKMBINDEX, TROPONINI in the last 168 hours.  BNP: Invalid input(s): POCBNP  CBG: Recent Labs  Lab 04/22/21 2235 04/23/21 0721 04/23/21 1131 04/23/21 2135 04/24/21 0744  GLUCAP 185* 198* 184* 130* 130*    Microbiology: Results for orders placed or performed during the hospital encounter of 04/09/21  Resp Panel by RT-PCR (Flu A&B, Covid) Nasopharyngeal Swab     Status: None   Collection Time: 04/09/21  5:58 PM   Specimen: Nasopharyngeal Swab; Nasopharyngeal(NP) swabs in vial transport medium  Result Value Ref Range Status   SARS Coronavirus 2 by RT PCR NEGATIVE NEGATIVE Final    Comment: (NOTE) SARS-CoV-2 target nucleic acids are NOT DETECTED.  The SARS-CoV-2 RNA  is generally detectable in upper respiratory specimens during the acute phase of infection. The lowest concentration of SARS-CoV-2 viral copies this assay can detect is 138 copies/mL. A negative result does not preclude SARS-Cov-2 infection and should not be used as the sole basis for treatment or other patient management decisions. A negative result may occur with  improper specimen collection/handling, submission of specimen other than nasopharyngeal swab, presence of viral mutation(s) within the areas targeted by this assay, and inadequate number of viral copies(<138 copies/mL). A negative result must be combined  with clinical observations, patient history, and epidemiological information. The expected result is Negative.  Fact Sheet for Patients:  EntrepreneurPulse.com.au  Fact Sheet for Healthcare Providers:  IncredibleEmployment.be  This test is no t yet approved or cleared by the [place] FDA and  has been authorized for detection and/or diagnosis of SARS-CoV-2 by FDA under an Emergency Use Authorization (EUA). This EUA will remain  in effect (meaning this test can be used) for the duration of the COVID-19 declaration under Section 564(b)(1) of the Act, 21 U.S.C.section 360bbb-3(b)(1), unless the authorization is terminated  or revoked sooner.       Influenza A by PCR NEGATIVE NEGATIVE Final   Influenza B by PCR NEGATIVE NEGATIVE Final    Comment: (NOTE) The Xpert Xpress SARS-CoV-2/FLU/RSV plus assay is intended as an aid in the diagnosis of influenza from Nasopharyngeal swab specimens and should not be used as a sole basis for treatment. Nasal washings and aspirates are unacceptable for Xpert Xpress SARS-CoV-2/FLU/RSV testing.  Fact Sheet for Patients: EntrepreneurPulse.com.au  Fact Sheet for Healthcare Providers: IncredibleEmployment.be  This test is not yet approved or cleared by the [place] FDA and has been authorized for detection and/or diagnosis of SARS-CoV-2 by FDA under an Emergency Use Authorization (EUA). This EUA will remain in effect (meaning this test can be used) for the duration of the COVID-19 declaration under Section 564(b)(1) of the Act, 21 U.S.C. section 360bbb-3(b)(1), unless the authorization is terminated or revoked.  Performed at [facility], [address] [place], [place] [address]   CULTURE, BLOOD (ROUTINE X 2) w Reflex to ID Panel     Status: Abnormal   Collection Time: 04/17/21 11:58 AM   Specimen: BLOOD  Result Value Ref Range Status   Specimen  Description   Final    BLOOD BLOOD RIGHT ARM Performed at [facility], [address], [place], [place] [address]    Special Requests   Final    BOTTLES DRAWN AEROBIC AND ANAEROBIC Blood Culture adequate volume Performed at [facility], [address], [place], [place] [address]    Culture  Setup Time   Final    GRAM POSITIVE COCCI IN BOTH AEROBIC AND ANAEROBIC BOTTLES CRITICAL RESULT CALLED TO, READ BACK BY AND VERIFIED WITH: JASON ROBBINS ON 04/18/21 AT 0035 QSD Performed at [facility], [address] [place], [place] [address]    Culture (A)  Final    STREPTOCOCCUS GROUP G SUSCEPTIBILITIES PERFORMED ON PREVIOUS CULTURE WITHIN THE LAST 5 DAYS. Performed at [facility], [place] [address], [place], [place] [address]    Report Status 04/20/2021 FINAL  Final  CULTURE, BLOOD (ROUTINE X 2) w Reflex to ID Panel     Status: Abnormal   Collection Time: 04/17/21 11:58 AM   Specimen: BLOOD  Result Value Ref Range Status   Specimen Description   Final    BLOOD BLOOD RIGHT HAND Performed at [facility], [address] [place], [place] [address]    Special Requests   Final    BOTTLES DRAWN AEROBIC AND ANAEROBIC Blood Culture adequate volume Performed at [facility], [address] [place], [place] [address]    Culture  Setup Time   Final    GRAM POSITIVE COCCI IN BOTH AEROBIC AND ANAEROBIC BOTTLES CRITICAL RESULT CALLED TO, READ BACK BY AND VERIFIED WITH: JASON ROBBINS ON 04/18/21 AT 3818 QSD Performed at [facility], [place] [address] [place], [place] [address]    Culture STREPTOCOCCUS GROUP G (A)  Final   Report Status 04/20/2021 FINAL  Final   Organism ID, Bacteria STREPTOCOCCUS GROUP G  Final      Susceptibility   Streptococcus group g - MIC*    CLINDAMYCIN RESISTANT Resistant     AMPICILLIN <=0.25 SENSITIVE Sensitive     VANCOMYCIN 0.5 SENSITIVE Sensitive     CEFTRIAXONE <=0.12 SENSITIVE Sensitive      LEVOFLOXACIN 0.5 SENSITIVE Sensitive     PENICILLIN Value in next row Sensitive      SENSITIVE<=0.06    * STREPTOCOCCUS GROUP G  Blood Culture ID Panel (Reflexed)     Status: Abnormal   Collection Time: 04/17/21 11:58 AM  Result Value Ref Range Status   Enterococcus faecalis NOT DETECTED NOT DETECTED Final   Enterococcus Faecium NOT DETECTED NOT DETECTED Final   Listeria monocytogenes NOT DETECTED NOT DETECTED Final   Staphylococcus species NOT DETECTED NOT DETECTED Final   Staphylococcus aureus (BCID) NOT DETECTED NOT DETECTED Final   Staphylococcus epidermidis NOT DETECTED NOT DETECTED Final   Staphylococcus lugdunensis NOT DETECTED NOT DETECTED Final   Streptococcus species DETECTED (A) NOT DETECTED Final    Comment: Not Enterococcus species, Streptococcus agalactiae, Streptococcus pyogenes, or Streptococcus pneumoniae. CRITICAL RESULT CALLED TO, READ BACK BY AND VERIFIED WITH: JASON ROBBINS ON 04/18/21 AT 0035 QSD    Streptococcus agalactiae NOT DETECTED NOT DETECTED Final   Streptococcus pneumoniae NOT DETECTED NOT DETECTED Final   Streptococcus pyogenes NOT DETECTED NOT DETECTED Final   A.calcoaceticus-baumannii NOT DETECTED NOT DETECTED Final   Bacteroides fragilis NOT DETECTED NOT DETECTED Final   Enterobacterales NOT DETECTED NOT DETECTED Final   Enterobacter cloacae complex NOT DETECTED NOT DETECTED Final   Escherichia coli NOT DETECTED NOT DETECTED Final   Klebsiella aerogenes NOT DETECTED NOT DETECTED Final   Klebsiella oxytoca NOT DETECTED NOT DETECTED Final   Klebsiella pneumoniae NOT DETECTED NOT DETECTED Final   Proteus species NOT DETECTED NOT DETECTED Final   Salmonella species NOT DETECTED NOT DETECTED Final   Serratia marcescens NOT DETECTED NOT DETECTED Final   Haemophilus influenzae NOT DETECTED NOT DETECTED Final   Neisseria meningitidis NOT DETECTED NOT DETECTED Final   Pseudomonas aeruginosa NOT DETECTED NOT DETECTED Final   Stenotrophomonas maltophilia NOT  DETECTED NOT DETECTED Final   Candida albicans NOT DETECTED NOT DETECTED Final   Candida auris NOT DETECTED NOT DETECTED Final   Candida glabrata NOT DETECTED NOT DETECTED Final   Candida krusei NOT DETECTED NOT DETECTED Final   Candida parapsilosis NOT DETECTED NOT DETECTED Final   Candida tropicalis NOT DETECTED NOT DETECTED Final   Cryptococcus neoformans/gattii NOT DETECTED NOT DETECTED Final    Comment: Performed at [facility], [address] [place], [name] [address]  MRSA PCR Screening     Status: None   Collection Time: 04/17/21  3:46 PM   Specimen: Nasal Mucosa; Nasopharyngeal  Result Value Ref Range Status   MRSA by PCR NEGATIVE  NEGATIVE Final    Comment:        The GeneXpert MRSA Assay (FDA approved for NASAL specimens only), is one component of a comprehensive MRSA colonization surveillance program. It is not intended to diagnose MRSA infection nor to guide or monitor treatment for MRSA infections. Performed at [facility], [address] [place], [place] [address]   Culture, blood (single) w Reflex to ID Panel     Status: None (Preliminary result)   Collection Time: 04/20/21  3:37 AM   Specimen: BLOOD  Result Value Ref Range Status   Specimen Description BLOOD LEFT HAND  Final   Special Requests   Final    BOTTLES DRAWN AEROBIC AND ANAEROBIC Blood Culture adequate volume   Culture   Final    NO GROWTH 4 DAYS Performed at [facility], [address] [place], [place] [address]    Report Status PENDING  Incomplete  Culture, blood (single) w Reflex to ID Panel     Status: None (Preliminary result)   Collection Time: 04/23/21 12:53 PM   Specimen: BLOOD  Result Value Ref Range Status   Specimen Description BLOOD BLOOD LEFT HAND  Final   Special Requests   Final    BOTTLES DRAWN AEROBIC AND ANAEROBIC Blood Culture adequate volume   Culture   Final    NO GROWTH < 24 HOURS Performed at [facility], [address] [place], [place] [address]    Report Status PENDING  Incomplete    Coagulation Studies: No results for input(s): LABPROT, INR in the last 72 hours.  Urinalysis: No results for input(s): COLORURINE, LABSPEC, PHURINE, GLUCOSEU, HGBUR, BILIRUBINUR, KETONESUR, PROTEINUR, UROBILINOGEN, NITRITE, LEUKOCYTESUR in the last 72 hours.  Invalid input(s): APPERANCEUR    Imaging: No results found.   Medications:   . sodium chloride 5 mL/hr at 04/22/21 0918  . vancomycin     . amLODipine  10 mg Oral Daily  . aspirin  81 mg Oral Daily  . Chlorhexidine Gluconate Cloth  6 each Topical Q0600  . cholecalciferol  1,000 Units Oral Daily  . cloNIDine  0.3 mg Transdermal Weekly  . epoetin (EPOGEN/PROCRIT) injection  4,000 Units Intravenous Q M,W,F-HD  . feeding supplement  237 mL Oral TID BM  . heparin  5,000 Units Subcutaneous Q8H  . insulin aspart  0-6 Units Subcutaneous TID WC  . latanoprost  1 drop Both Eyes QHS  . letrozole  2.5 mg Oral Daily  . levETIRAcetam  500 mg Oral Daily  . levothyroxine  112 mcg Oral Q0600  . metoprolol succinate  50 mg Oral Daily  . multivitamin  1 tablet Oral QHS   sodium chloride, acetaminophen **OR** acetaminophen, calcium carbonate, camphor-menthol **AND** hydrOXYzine, docusate sodium, hydrALAZINE, LORazepam, ondansetron **OR** ondansetron (ZOFRAN) IV, sorbitol  Assessment/ Plan:  [name] is a 75 y.o.  female  with past medical history of GERD, Hypertension, diabetes, hyperlipidemia, CVA and CKD stage 4. She presents to the ED with increased lower extremity edema. She is known to our team from previous admissions.   1. End Stage Renal Disease requiring new start hemodialysis:  First dialysis treatment 5/6.   - Receive dialysis today in bed - Tolerated well - Has had a few dialysis treatments in the chair and tolerated well with some encouragement - Blood cultures positive for group G strep and started on Ceftriaxone. HD cath determined unlikely  source - Will require 6 weeks IV vancomycin 500mg  with each dialysis treatment (MWF).  Loading dose given 04/23/21. - Afebrile overnight - Outpatient planning: Dialysis liaison notified of outpatient center acceptance at [facility], MWF, [phone]. Hoyer pad recommended for outpatient clinic. Clinic notified   2. Anemia of chronic kidney disease  Lab Results  Component Value Date   HGB 7.1 (L) 04/24/2021  - Received 1 unit RBCs during dialysis EPO with treatments   3. Secondary Hyperparathyroidism: with hypocalcemia   Lab Results  Component Value Date   PTH 161 (H) 01/18/2021   CALCIUM 8.4 (L) 04/24/2021   PHOS 2.2 (L) 04/20/2021  Phosphorus below target Oral intake improving  4.Diabetes mellitus type II with chronic kidney disease noninsulin dependent.  hemoglobin A1c 5.7 on 01/18/21.  Stable   5. Hypertension: Home regimen of clonidine, amlodipine, metoprolol continued Stable at this time   LOS: 15   5/20/202212:32 PM

## 2021-04-24 NOTE — TOC Progression Note (Addendum)
Transition of Care [facility]) - Progression Note    Patient Details  Name: Kirsten Mcmahon MRN: [id number] Date of Birth: March 07, 1946  Transition of Care [facility]) CM/SW [place], LCSW Phone Number: 04/24/2021, 10:25 AM  Clinical Narrative: Per MD, patient should be able to discharge home today. Left voicemail for Kem Boroughs, RN with [facility].   12:26 pm: Harrel Lemon lift was delivered to the home this morning. Patient getting blood in HD. Will call Ms. Brooks when discharge is in to coordinate a time for transport. Son is aware and agreeable. Reminded him to bring hoyer pad to HD on Monday.  2:11 pm: Received call from [place] at [facility]. They are no longer able to transport patient home today and will call son to notify.  2:28 pm: Left voicemail for son to see if he wants her to go home by EMS.  Expected Discharge Plan: [place] Barriers to Discharge: Continued Medical Work up  Expected Discharge Plan and Services Expected Discharge Plan: [place] arrangements for the past 2 months: Single Family Home                                       Social Determinants of Health (SDOH) Interventions    Readmission Risk Interventions Readmission Risk Prevention Plan 04/11/2021  Transportation Screening Complete  PCP or Specialist Appt within 3-5 Days Complete  HRI or [place] Complete  Social Work Consult for [place] Planning/Counseling Complete  Palliative Care Screening Not Applicable  Medication Review Press photographer) Complete  Some recent data might be hidden

## 2021-04-24 NOTE — Discharge Summary (Signed)
Physician Discharge Summary  Kirsten Mcmahon TGG:[id number] DOB: 1946/07/25 DOA: 04/09/2021  PCP: [place] date: 04/09/2021 Discharge date: 04/24/2021  Admitted From: home  Disposition:  Home   Recommendations for Outpatient Follow-up:  1. Follow up with PCP in 1-2 weeks 2. F/u ID, Dr. Ola Spurr, in 4 weeks 3. F/u w/ nephro in 1-2 weeks   Home Health: no  Equipment/Devices:  Discharge Condition: stable  CODE STATUS: full  Diet recommendation: Heart Healthy / Carb Modified / Renal   Brief/Interim Summary: HPI was taken from Dr. Jonelle Mcmahon: Kirsten Mcmahon is a 75 y.o. female with medical history significant of chronic kidney disease stage IV, GERD, hypertension, hypothyroidism, non-insulin-dependent diabetes, hyperlipidemia, previous CVA and overactive bladder who has been seeing nephrology but not yet ready for hemodialysis.  She noticed worsening lower extremities edema left more than right.  This has been going on for a number of days.  She went to her PCP where she was placed on increased dose of diuretics.  Swelling did not improve but her renal function appears to have worsened today.  She is seen in the ER with a creatinine of 7.99.  Her previous creatinine was 3.993 months ago.  She is slightly acidotic with CO2 of 17.  Patient also has no significant shortness of breath.  Patient being admitted with acute on chronic kidney injury.  Denied any pain.  No altered mental status.  Potassium appears to be stable also..  ED Course: Temperature 98.2 blood pressure 130/85, pulse 84 respiratory 20 oxygen sat 99% on room air.  White count 10.6 hemoglobin 8.1 platelets 246.  Sodium 137 potassium 4.9 chloride 108 CO2 of 17 BUN 79 creatinine 7.99 calcium 7.3.  Glucose is 111.  Chest x-ray showed cardiomegaly otherwise no acute findings.  Renal ultrasound showed medical renal disease no hydronephrosis no sign of obstruction.  Patient is admitted with acute on chronic kidney  disease stage IV.  From Dr. Mal Mcmahon: Kirsten Mcmahon is a 75 y.o. female with medical history significant for CKD stage V, hypertension, hypothyroidism, diabetes mellitus, hyperlipidemia, history of stroke, seizure disorder, overactive bladder.  She presented to the hospital because of increased swelling of the lower extremities despite taking diuretics.  In the emergency room, she was noted to have a creatinine of 7.99 which was much higher than her previous creatinine of 3.99 (2 months prior).  She was admitted to the hospital for AKI on CKD stage V.  She was evaluated by the nephrologist.  Vascular surgeon was consulted and a right IJ tunneled hemodialysis permacath was placed on 04/10/2021.  Hemodialysis was initiated on 04/10/2021.  She developed streptococcal sepsis secondary to strep group G bacteremia.  ID was consulted to assist with management.  Cardiologist has been consulted for TEE.   Hospital course from Dr. Jimmye Mcmahon 5/18-5/20/22: Pt had a temp of 100.2 on 04/22/21 night and repeat blood cxs were done the next day. Repeat blood cxs NGTD. Pt will on IV vanco MWF until June 27 as per ID. And after abx stopped repeat blood cx should be checked at HD 10 days after last dose to ensure clearance of bacteremia as the cath was not removed as per ID. For more information, please see previous progress/consult notes.   Discharge Diagnoses:  Principal Problem:   Acute renal failure superimposed on stage 3b chronic kidney disease (HCC) Active Problems:   Hypertension   Hypothyroidism   GERD (gastroesophageal reflux disease)   Anemia in chronic kidney disease (  CKD)   Type II diabetes mellitus with renal manifestations (HCC)   ESRD needing dialysis ([place])   Leukocytosis   Fever   Streptococcal sepsis, unspecified ([place])   Streptococcal bacteremia  ESRD: HD was initiated on 04/10/2021.  Will continue on HD MWF as per nephro. Nephro recs apprec   Streptococcal sepsis and bacteremia:  continue on IV  ampicillin x 6 weeks MWF (stop on June 27) as per ID.  No evidence of vegetation on 2D echo.  Pt's son declined TEE. No fever in 24 hours. Repeat blood cxs NGTD  Likely metabolic encephalopathy/delirium: possibly underlying cognitive impairment. Likely close to her baseline   Hypokalemia: WNL today   Hypophosphatemia: will monitor intermittently   Left lower extremity edema: No evidence of DVT.  ACD: given 1 unit of pRBCs 04/24/21. S/p 1 unit pRBCS transfused on 04/11/21.  Seizure disorder: continue on keepra   HTN: continue on metoprolol, amlodipine, clonidine    DM2: continue on SSI w/ accuchecks   Dysphagia: continue on dysphagia II diet as per speech   Discharge Instructions  Discharge Instructions    Diet - low sodium heart healthy   Complete by: As directed    Dysphagia II diet: Extra Gravy on meats, potatoes. May have a Cheese and minced tomatoes Omelette per speech at breakfast. Pt may have pintos per speech and collard greens minced   Discharge instructions   Complete by: As directed    F/u w/ PCP in 1-2 weeks. F/u w/ ID, Dr. Ola Spurr, in 4 weeks. F/u w/ nephro in 1 week   Increase activity slowly   Complete by: As directed      Allergies as of 04/24/2021      Reactions   Contrast Media [iodinated Diagnostic Agents]    Other reaction(s): NO ALLERGY   Latex    Other reaction(s): NO ALLERGY   Shellfish-derived Products    Other reaction(s): NO ALLERGY   Levemir [insulin Detemir] Itching      Medication List    TAKE these medications   alendronate 70 MG tablet Commonly known as: FOSAMAX Take 1 tablet (70 mg total) by mouth once a week. Take with a full glass of water on an empty stomach.   amLODipine 10 MG tablet Commonly known as: NORVASC Take 10 mg by mouth daily. What changed: Another medication with the same name was removed. Continue taking this medication, and follow the directions you see here.   aspirin EC 81 MG tablet Take 81 mg by mouth  daily.   cholecalciferol 1000 units tablet Commonly known as: VITAMIN D Take 1,000 Units by mouth daily.   cloNIDine 0.2 mg/24hr patch Commonly known as: CATAPRES - Dosed in mg/24 hr Place 0.2 mg onto the skin once a week. Wednesday   Januvia 25 MG tablet Generic drug: sitaGLIPtin Take 25 mg by mouth daily.   Keppra XR 500 MG 24 hr tablet Generic drug: levETIRAcetam Take 500 mg by mouth daily. What changed: Another medication with the same name was removed. Continue taking this medication, and follow the directions you see here.   latanoprost 0.005 % ophthalmic solution Commonly known as: XALATAN Place 1 drop into both eyes at bedtime.   letrozole 2.5 MG tablet Commonly known as: FEMARA Take 1 tablet (2.5 mg total) by mouth daily.   levothyroxine 112 MCG tablet Commonly known as: SYNTHROID Take 1 tablet (112 mcg total) by mouth daily before breakfast.   loperamide 2 MG capsule Commonly known as: IMODIUM Take 2 mg by  mouth 2 (two) times daily as needed for diarrhea or loose stools.   meclizine 25 MG tablet Commonly known as: ANTIVERT Take 25 mg by mouth daily as needed for dizziness.   metoprolol succinate 25 MG 24 hr tablet Commonly known as: TOPROL-XL Take 50 mg by mouth daily.   mirabegron ER 25 MG Tb24 tablet Commonly known as: Myrbetriq Take 1 tablet (25 mg total) by mouth daily.   Tylenol 325 MG tablet Generic drug: acetaminophen Take 650 mg by mouth daily as needed for pain.   vancomycin 500 MG/100ML IVPB Commonly known as: VANCOREADY Inject 100 mLs (500 mg total) into the vein every Monday, Wednesday, and Friday with hemodialysis.       Allergies  Allergen Reactions  . Contrast Media [Iodinated Diagnostic Agents]     Other reaction(s): NO ALLERGY  . Latex     Other reaction(s): NO ALLERGY  . Shellfish-Derived Products     Other reaction(s): NO ALLERGY  . Levemir [Insulin Detemir] Itching    Consultations:  ID, Dr. Rise Mu, Dr.  Candiss Norse   Cardio, Dr. Ubaldo Glassing    Procedures/Studies: DG Chest 2 View  Result Date: 04/09/2021 CLINICAL DATA:  BILATERAL leg swelling since yesterday, diabetes mellitus, hypertension, history stroke EXAM: CHEST - 2 VIEW COMPARISON:  01/18/2021 FINDINGS: Enlargement of cardiac silhouette. Mild tortuosity and atherosclerotic calcification of thoracic aorta. Mediastinal contours and pulmonary vascularity normal. Lungs clear. No acute infiltrate, pleural effusion, or pneumothorax. Bones demineralized. IMPRESSION: Enlargement of cardiac silhouette. No acute abnormalities. Aortic Atherosclerosis (ICD10-I70.0). Electronically Signed   By: Lavonia Dana M.D.   On: 04/09/2021 16:49   CT HEAD WO CONTRAST  Result Date: 04/14/2021 CLINICAL DATA:  Mental status change, unknown cause. Additional history provided: Mental status change, past history of cerebral aneurysm repair, seizure and stroke. EXAM: CT HEAD WITHOUT CONTRAST TECHNIQUE: Contiguous axial images were obtained from the base of the skull through the vertex without intravenous contrast. COMPARISON:  Brain MRI 01/17/2021.  Head CT 01/17/2021. FINDINGS: Brain: Mild generalized cerebral and cerebellar atrophy. Redemonstrated chronic cortical/subcortical infarct within the right frontal operculum and right insula. Associated wallerian degeneration affecting the right brainstem. Redemonstrated chronic lacunar infarcts within the deep gray nuclei bilaterally. Background advanced patchy and ill-defined hypoattenuation within the cerebral white matter, nonspecific but compatible with chronic small vessel ischemic disease. Redemonstrated small chronic infarcts within the bilateral cerebellar hemispheres. There is no acute intracranial hemorrhage. No acute demarcated cortical infarct. No extra-axial fluid collection. No evidence of intracranial mass. No midline shift. Vascular: No hyperdense vessel.  Atherosclerotic calcifications. Skull: Prior right craniotomy.  No  calvarial fracture. Sinuses/Orbits: Visualized orbits show no acute finding. No significant paranasal sinus disease at the imaged levels. IMPRESSION: No evidence of acute intracranial abnormality. Redemonstrated chronic cortical/subcortical infarct within the right frontal operculum and right insula with overlying craniotomy defect. Associated Wallerian degeneration of the right brainstem. Redemonstrated chronic lacunar infarcts within the deep gray nuclei bilaterally. Stable background severe cerebral white matter chronic small vessel ischemic disease. Redemonstrated chronic infarcts within the bilateral cerebellar hemispheres. Stable mild generalized parenchymal atrophy. Electronically Signed   By: Kellie Simmering DO   On: 04/14/2021 13:37   US Renal  Result Date: 04/09/2021 CLINICAL DATA:  Acute kidney injury. EXAM: RENAL / URINARY TRACT ULTRASOUND COMPLETE COMPARISON:  None. FINDINGS: Right Kidney: Renal measurements: 8.4 cm x 3.5 cm x 3.8 cm = volume: 59 mL. Diffusely increased echogenicity of the renal parenchyma is noted. No mass or hydronephrosis visualized. Left Kidney: Renal measurements:  9.0 cm x 4.6 cm x 3.8 cm = volume: 83 mL. Diffusely increased echogenicity of the renal parenchyma is noted. A 2.0 cm x 2.1 cm x 1.8 cm anechoic structure is seen within the mid left kidney. No abnormal flow is noted within this region on color Doppler evaluation. No hydronephrosis is visualized. Bladder: Appears normal for degree of bladder distention. Other: None. IMPRESSION: 1. Increased renal echogenicity which may be secondary to medical renal disease. 2. Simple cyst within the left kidney. Electronically Signed   By: Virgina Norfolk M.D.   On: 04/09/2021 19:20   PERIPHERAL VASCULAR CATHETERIZATION  Result Date: 04/10/2021 See op note  US Venous Img Lower Unilateral Left (DVT)  Result Date: 04/10/2021 CLINICAL DATA:  Two day history of left lower extremity edema EXAM: LEFT LOWER EXTREMITY VENOUS DOPPLER  ULTRASOUND TECHNIQUE: Gray-scale sonography with compression, as well as color and duplex ultrasound, were performed to evaluate the deep venous system(s) from the level of the common femoral vein through the popliteal and proximal calf veins. COMPARISON:  None. FINDINGS: VENOUS Normal compressibility of the common femoral, superficial femoral, and popliteal veins, as well as the visualized calf veins. Visualized portions of profunda femoral vein and great saphenous vein unremarkable. No filling defects to suggest DVT on grayscale or color Doppler imaging. Doppler waveforms show normal direction of venous flow, normal respiratory plasticity and response to augmentation. Limited views of the contralateral common femoral vein are unremarkable. OTHER Superficial subcutaneous edema in the calf. Limitations: Calf veins are not well seen. IMPRESSION: 1. No evidence of deep venous thrombosis to the level of the knee. Calf veins are not well seen. Electronically Signed   By: Jacqulynn Cadet M.D.   On: 04/10/2021 14:50   DG Chest Port 1 View  Result Date: 04/17/2021 CLINICAL DATA:  Fever and leukocytosis. EXAM: PORTABLE CHEST 1 VIEW COMPARISON:  Chest x-ray dated Apr 09, 2021. FINDINGS: Patient is rotated to the right. New tunneled right internal jugular dialysis catheter with distal lumen tip in the right atrium. Unchanged cardiomegaly. Normal pulmonary vascularity. No focal consolidation, pleural effusion, or pneumothorax. No acute osseous abnormality. Old healed displaced fracture of the left humeral diaphysis. IMPRESSION: 1. No active disease. Electronically Signed   By: Titus Dubin M.D.   On: 04/17/2021 16:00   ECHOCARDIOGRAM COMPLETE  Result Date: 04/20/2021    ECHOCARDIOGRAM REPORT   Patient Name:   Kirsten Mcmahon Date of Exam: 04/20/2021 Medical Rec #:  [id number]         Height:       62.0 in Accession #:    [id number]        Weight:       128.2 lb Date of Birth:  05/02/1946          BSA:          1.582  m Patient Age:    34 years          BP:           148/75 mmHg Patient Gender: F                 HR:           82 bpm. Exam Location:  [facility] Procedure: 2D Echo, Cardiac Doppler and Color Doppler Indications:     Bacteremia R78.81  History:         Patient has prior history of Echocardiogram examinations, most  recent 07/02/2019. Stroke; Risk Factors:Hypertension.  Sonographer:     Sherrie Sport RDCS (AE) Referring Phys:  [place] Diagnosing Phys: Kathlyn Sacramento MD  Sonographer Comments: Suboptimal apical window. IMPRESSIONS  1. Left ventricular ejection fraction, by estimation, is 55 to 60%. The left ventricle has normal function. The left ventricle has no regional wall motion abnormalities. There is mild left ventricular hypertrophy. Left ventricular diastolic parameters are consistent with Grade I diastolic dysfunction (impaired relaxation).  2. Right ventricular systolic function is normal. The right ventricular size is normal. Tricuspid regurgitation signal is inadequate for assessing PA pressure.  3. Left atrial size was mildly dilated.  4. The mitral valve is normal in structure. No evidence of mitral valve regurgitation. No evidence of mitral stenosis.  5. The aortic valve is normal in structure. Aortic valve regurgitation is trivial. No aortic stenosis is present.  6. The inferior vena cava is normal in size with greater than 50% respiratory variability, suggesting right atrial pressure of 3 mmHg. FINDINGS  Left Ventricle: Left ventricular ejection fraction, by estimation, is 55 to 60%. The left ventricle has normal function. The left ventricle has no regional wall motion abnormalities. The left ventricular internal cavity size was normal in size. There is  mild left ventricular hypertrophy. Left ventricular diastolic parameters are consistent with Grade I diastolic dysfunction (impaired relaxation). Right Ventricle: The right ventricular size is normal. No increase in right  ventricular wall thickness. Right ventricular systolic function is normal. Tricuspid regurgitation signal is inadequate for assessing PA pressure. Left Atrium: Left atrial size was mildly dilated. Right Atrium: Right atrial size was normal in size. Pericardium: There is no evidence of pericardial effusion. Mitral Valve: The mitral valve is normal in structure. No evidence of mitral valve regurgitation. No evidence of mitral valve stenosis. Tricuspid Valve: The tricuspid valve is normal in structure. Tricuspid valve regurgitation is not demonstrated. No evidence of tricuspid stenosis. Aortic Valve: The aortic valve is normal in structure. Aortic valve regurgitation is trivial. No aortic stenosis is present. Aortic valve mean gradient measures 4.3 mmHg. Aortic valve peak gradient measures 8.0 mmHg. Aortic valve area, by VTI measures 2.04 cm. Pulmonic Valve: The pulmonic valve was normal in structure. Pulmonic valve regurgitation is not visualized. No evidence of pulmonic stenosis. Aorta: The aortic root is normal in size and structure. Venous: The inferior vena cava is normal in size with greater than 50% respiratory variability, suggesting right atrial pressure of 3 mmHg. IAS/Shunts: No atrial level shunt detected by color flow Doppler.  LEFT VENTRICLE PLAX 2D LVIDd:         3.72 cm  Diastology LVIDs:         2.63 cm  LV e' medial:    5.98 cm/s LV PW:         1.76 cm  LV E/e' medial:  10.0 LV IVS:        0.93 cm  LV e' lateral:   4.68 cm/s LVOT diam:     2.10 cm  LV E/e' lateral: 12.8 LV SV:         42 LV SV Index:   27 LVOT Area:     3.46 cm  RIGHT VENTRICLE RV Basal diam:  1.92 cm RV S prime:     12.10 cm/s TAPSE (M-mode): 3.0 cm LEFT ATRIUM             Index       RIGHT ATRIUM           Index  LA diam:        3.00 cm 1.90 cm/m  RA Area:     11.70 cm LA Vol (A2C):   61.0 ml 38.55 ml/m RA Volume:   24.70 ml  15.61 ml/m LA Vol (A4C):   50.8 ml 32.10 ml/m LA Biplane Vol: 56.7 ml 35.83 ml/m  AORTIC VALVE                    PULMONIC VALVE AV Area (Vmax):    1.75 cm    PV Vmax:        0.87 m/s AV Area (Vmean):   1.78 cm    PV Peak grad:   3.1 mmHg AV Area (VTI):     2.04 cm    RVOT Peak grad: 5 mmHg AV Vmax:           141.00 cm/s AV Vmean:          96.833 cm/s AV VTI:            0.207 m AV Peak Grad:      8.0 mmHg AV Mean Grad:      4.3 mmHg LVOT Vmax:         71.10 cm/s LVOT Vmean:        49.700 cm/s LVOT VTI:          0.122 m LVOT/AV VTI ratio: 0.59  AORTA Ao Root diam: 3.20 cm MITRAL VALVE               TRICUSPID VALVE MV Area (PHT): 4.93 cm    TR Peak grad:   14.4 mmHg MV Decel Time: 154 msec    TR Vmax:        190.00 cm/s MV E velocity: 60.00 cm/s MV A velocity: 98.50 cm/s  SHUNTS MV E/A ratio:  0.61        Systemic VTI:  0.12 m                            Systemic Diam: 2.10 cm Kathlyn Sacramento MD Electronically signed by Kathlyn Sacramento MD Signature Date/Time: 04/20/2021/11:46:57 AM    Final    (Echo, Carotid, EGD, Colonoscopy, ERCP)    Subjective:   Discharge Exam: Vitals:   04/24/21 1215 04/24/21 1240  BP: 114/68 118/76  Pulse: 93 89  Resp: 20 15  Temp: 98.3 F (36.8 C) 97.7 F (36.5 C)  SpO2: 99% 100%   Vitals:   04/24/21 1145 04/24/21 1200 04/24/21 1215 04/24/21 1240  BP: 115/74 117/76 114/68 118/76  Pulse: 93 92 93 89  Resp: 17 (!) 22 20 15   Temp:   98.3 F (36.8 C) 97.7 F (36.5 C)  TempSrc:    Oral  SpO2: 100% 100% 99% 100%  Weight:      Height:        General: Pt is alert, awake, not in acute distress Cardiovascular: RRR, S1/S2 +, no rubs, no gallops Respiratory: CTA bilaterally, no wheezing, no rhonchi Abdominal: Soft, NT, ND, bowel sounds + Extremities: no edema, no cyanosis    The results of significant diagnostics from this hospitalization (including imaging, microbiology, ancillary and laboratory) are listed below for reference.     Microbiology: Recent Results (from the past 240 hour(s))  CULTURE, BLOOD (ROUTINE X 2) w Reflex to ID Panel     Status: Abnormal    Collection Time: 04/17/21 11:58 AM   Specimen: BLOOD  Result Value Ref Range Status  Specimen Description   Final    BLOOD BLOOD RIGHT ARM Performed at [facility], [address] [place], [place] [address]    Special Requests   Final    BOTTLES DRAWN AEROBIC AND ANAEROBIC Blood Culture adequate volume Performed at [facility], [address] [place], [place] [address]    Culture  Setup Time   Final    GRAM POSITIVE COCCI IN BOTH AEROBIC AND ANAEROBIC BOTTLES CRITICAL RESULT CALLED TO, READ BACK BY AND VERIFIED WITH: JASON ROBBINS ON 04/18/21 AT 0035 QSD Performed at [facility], [address] [place], [place] [address]    Culture (A)  Final    STREPTOCOCCUS GROUP G SUSCEPTIBILITIES PERFORMED ON PREVIOUS CULTURE WITHIN THE LAST 5 DAYS. Performed at [facility], [place] [address], [place], [place] [address]    Report Status 04/20/2021 FINAL  Final  CULTURE, BLOOD (ROUTINE X 2) w Reflex to ID Panel     Status: Abnormal   Collection Time: 04/17/21 11:58 AM   Specimen: BLOOD  Result Value Ref Range Status   Specimen Description   Final    BLOOD BLOOD RIGHT HAND Performed at [facility], [address] [place], [place] [address]    Special Requests   Final    BOTTLES DRAWN AEROBIC AND ANAEROBIC Blood Culture adequate volume Performed at [facility], [address], [place], [place] [address]    Culture  Setup Time   Final    GRAM POSITIVE COCCI IN BOTH AEROBIC AND ANAEROBIC BOTTLES CRITICAL RESULT CALLED TO, READ BACK BY AND VERIFIED WITH: JASON ROBBINS ON 04/18/21 AT 0035 QSD Performed at [facility], [place] [address], [place], [place] [address]    Culture STREPTOCOCCUS GROUP G (A)  Final   Report Status 04/20/2021 FINAL  Final   Organism ID, Bacteria STREPTOCOCCUS GROUP G  Final      Susceptibility   Streptococcus group g - MIC*    CLINDAMYCIN RESISTANT Resistant     AMPICILLIN <=0.25 SENSITIVE Sensitive      VANCOMYCIN 0.5 SENSITIVE Sensitive     CEFTRIAXONE <=0.12 SENSITIVE Sensitive     LEVOFLOXACIN 0.5 SENSITIVE Sensitive     PENICILLIN Value in next row Sensitive      SENSITIVE<=0.06    * STREPTOCOCCUS GROUP G  Blood Culture ID Panel (Reflexed)     Status: Abnormal   Collection Time: 04/17/21 11:58 AM  Result Value Ref Range Status   Enterococcus faecalis NOT DETECTED NOT DETECTED Final   Enterococcus Faecium NOT DETECTED NOT DETECTED Final   Listeria monocytogenes NOT DETECTED NOT DETECTED Final   Staphylococcus species NOT DETECTED NOT DETECTED Final   Staphylococcus aureus (BCID) NOT DETECTED NOT DETECTED Final   Staphylococcus epidermidis NOT DETECTED NOT DETECTED Final   Staphylococcus lugdunensis NOT DETECTED NOT DETECTED Final   Streptococcus species DETECTED (A) NOT DETECTED Final    Comment: Not Enterococcus species, Streptococcus agalactiae, Streptococcus pyogenes, or Streptococcus pneumoniae. CRITICAL RESULT CALLED TO, READ BACK BY AND VERIFIED WITH: JASON ROBBINS ON 04/18/21 AT 0035 QSD    Streptococcus agalactiae NOT DETECTED NOT DETECTED Final   Streptococcus pneumoniae NOT DETECTED NOT DETECTED Final   Streptococcus pyogenes NOT DETECTED NOT DETECTED Final   A.calcoaceticus-baumannii NOT DETECTED NOT DETECTED Final   Bacteroides fragilis NOT DETECTED NOT DETECTED Final   Enterobacterales NOT DETECTED NOT DETECTED Final   Enterobacter cloacae complex NOT DETECTED NOT DETECTED Final   Escherichia coli NOT DETECTED NOT DETECTED Final  Klebsiella aerogenes NOT DETECTED NOT DETECTED Final   Klebsiella oxytoca NOT DETECTED NOT DETECTED Final   Klebsiella pneumoniae NOT DETECTED NOT DETECTED Final   Proteus species NOT DETECTED NOT DETECTED Final   Salmonella species NOT DETECTED NOT DETECTED Final   Serratia marcescens NOT DETECTED NOT DETECTED Final   Haemophilus influenzae NOT DETECTED NOT DETECTED Final   Neisseria meningitidis NOT DETECTED NOT DETECTED Final    Pseudomonas aeruginosa NOT DETECTED NOT DETECTED Final   Stenotrophomonas maltophilia NOT DETECTED NOT DETECTED Final   Candida albicans NOT DETECTED NOT DETECTED Final   Candida auris NOT DETECTED NOT DETECTED Final   Candida glabrata NOT DETECTED NOT DETECTED Final   Candida krusei NOT DETECTED NOT DETECTED Final   Candida parapsilosis NOT DETECTED NOT DETECTED Final   Candida tropicalis NOT DETECTED NOT DETECTED Final   Cryptococcus neoformans/gattii NOT DETECTED NOT DETECTED Final    Comment: Performed at [facility], [address] [place], [place] [address]  MRSA PCR Screening     Status: None   Collection Time: 04/17/21  3:46 PM   Specimen: Nasal Mucosa; Nasopharyngeal  Result Value Ref Range Status   MRSA by PCR NEGATIVE NEGATIVE Final    Comment:        The [facility] MRSA Assay (FDA approved for NASAL specimens only), is one component of a comprehensive MRSA colonization surveillance program. It is not intended to diagnose MRSA infection nor to guide or monitor treatment for MRSA infections. Performed at [facility], [address] [place], [place] [address]   Culture, blood (single) w Reflex to ID Panel     Status: None (Preliminary result)   Collection Time: 04/20/21  3:37 AM   Specimen: BLOOD  Result Value Ref Range Status   Specimen Description BLOOD LEFT HAND  Final   Special Requests   Final    BOTTLES DRAWN AEROBIC AND ANAEROBIC Blood Culture adequate volume   Culture   Final    NO GROWTH 4 DAYS Performed at [facility], [address] [place], [place] [address]    Report Status PENDING  Incomplete  Culture, blood (single) w Reflex to ID Panel     Status: None (Preliminary result)   Collection Time: 04/23/21 12:53 PM   Specimen: BLOOD  Result Value Ref Range Status   Specimen Description BLOOD BLOOD LEFT HAND  Final   Special Requests   Final    BOTTLES DRAWN AEROBIC AND ANAEROBIC Blood Culture adequate volume   Culture    Final    NO GROWTH < 24 HOURS Performed at [facility], [address] [place], [place] [address]    Report Status PENDING  Incomplete     Labs: BNP (last 3 results) Recent Labs    04/09/21 1545  BNP 623.7*   Basic Metabolic Panel: Recent Labs  Lab 04/18/21 0408 04/19/21 0400 04/20/21 0337 04/23/21 0511 04/24/21 0639  NA 138 139 136 136 138  K 3.6 3.6 3.6 4.0 4.3  CL 100 100 97* 98 99  CO2 28 27 28 26 26   GLUCOSE 185* 156* 157* 163* 134*  BUN 28* 24* 39* 34* 49*  CREATININE 4.89* 3.64* 5.26* 4.74* 6.53*  CALCIUM 8.1* 8.3* 8.2* 8.2* 8.4*  MG 1.9  --  2.0  --   --   PHOS 3.2  --  2.2*  --   --    Liver Function Tests: No results for input(s): AST, ALT, ALKPHOS, BILITOT, PROT, ALBUMIN in the last 168  hours. No results for input(s): LIPASE, AMYLASE in the last 168 hours. No results for input(s): AMMONIA in the last 168 hours. CBC: Recent Labs  Lab 04/18/21 0408 04/19/21 0400 04/20/21 0337 04/22/21 1850 04/23/21 0511 04/24/21 0639  WBC 17.2* 13.6* 8.2  --  12.7* 10.8*  NEUTROABS 15.3* 11.2* 5.8  --   --   --   HGB 7.9* 8.0* 8.6* 8.6* 7.8* 7.1*  HCT 24.7* 25.4* 26.5* 26.7* 25.0* 22.2*  MCV 92.5 92.0 90.8  --  90.6 90.6  PLT 209 230 268  --  299 348   Cardiac Enzymes: No results for input(s): CKTOTAL, CKMB, CKMBINDEX, TROPONINI in the last 168 hours. BNP: Invalid input(s): POCBNP CBG: Recent Labs  Lab 04/22/21 2235 04/23/21 0721 04/23/21 1131 04/23/21 2135 04/24/21 0744  GLUCAP 185* 198* 184* 130* 130*   D-Dimer No results for input(s): DDIMER in the last 72 hours. Hgb A1c No results for input(s): HGBA1C in the last 72 hours. Lipid Profile No results for input(s): CHOL, HDL, LDLCALC, TRIG, CHOLHDL, LDLDIRECT in the last 72 hours. Thyroid function studies No results for input(s): TSH, T4TOTAL, T3FREE, THYROIDAB in the last 72 hours.  Invalid input(s): FREET3 Anemia work up No results for input(s): VITAMINB12, FOLATE, FERRITIN, TIBC,  IRON, RETICCTPCT in the last 72 hours. Urinalysis    Component Value Date/Time   COLORURINE YELLOW (A) 04/17/2021 1702   APPEARANCEUR HAZY (A) 04/17/2021 1702   LABSPEC 1.015 04/17/2021 1702   PHURINE 7.0 04/17/2021 1702   GLUCOSEU 150 (A) 04/17/2021 1702   HGBUR NEGATIVE 04/17/2021 1702   BILIRUBINUR NEGATIVE 04/17/2021 1702   KETONESUR 5 (A) 04/17/2021 1702   PROTEINUR >=300 (A) 04/17/2021 1702   NITRITE NEGATIVE 04/17/2021 1702   LEUKOCYTESUR NEGATIVE 04/17/2021 1702   Sepsis Labs Invalid input(s): PROCALCITONIN,  WBC,  LACTICIDVEN Microbiology Recent Results (from the past 240 hour(s))  CULTURE, BLOOD (ROUTINE X 2) w Reflex to ID Panel     Status: Abnormal   Collection Time: 04/17/21 11:58 AM   Specimen: BLOOD  Result Value Ref Range Status   Specimen Description   Final    BLOOD BLOOD RIGHT ARM Performed at [facility], [address], [place], [name] [address]    Special Requests   Final    BOTTLES DRAWN AEROBIC AND ANAEROBIC Blood Culture adequate volume Performed at [facility], [address] [place], [place] [address]    Culture  Setup Time   Final    GRAM POSITIVE COCCI IN BOTH AEROBIC AND ANAEROBIC BOTTLES CRITICAL RESULT CALLED TO, READ BACK BY AND VERIFIED WITH: JASON ROBBINS ON 04/18/21 AT 0035 QSD Performed at [facility], [address] [place], [place] [address]    Culture (A)  Final    STREPTOCOCCUS GROUP G SUSCEPTIBILITIES PERFORMED ON PREVIOUS CULTURE WITHIN THE LAST 5 DAYS. Performed at [facility], [address] [place], [place] [address]    Report Status 04/20/2021 FINAL  Final  CULTURE, BLOOD (ROUTINE X 2) w Reflex to ID Panel     Status: Abnormal   Collection Time: 04/17/21 11:58 AM   Specimen: BLOOD  Result Value Ref Range Status   Specimen Description   Final    BLOOD BLOOD RIGHT HAND Performed at [facility], [address] [place], [place] [address]    Special Requests   Final     BOTTLES DRAWN AEROBIC AND ANAEROBIC Blood Culture adequate volume Performed at [facility], [address] [place], [place]  [address]    Culture  Setup Time   Final    GRAM POSITIVE COCCI IN BOTH AEROBIC AND ANAEROBIC BOTTLES CRITICAL RESULT CALLED TO, READ BACK BY AND VERIFIED WITH: JASON ROBBINS ON 04/18/21 AT 4098 QSD Performed at [facility], [address] [place], [place] [address]    Culture STREPTOCOCCUS GROUP G (A)  Final   Report Status 04/20/2021 FINAL  Final   Organism ID, Bacteria STREPTOCOCCUS GROUP G  Final      Susceptibility   Streptococcus group g - MIC*    CLINDAMYCIN RESISTANT Resistant     AMPICILLIN <=0.25 SENSITIVE Sensitive     VANCOMYCIN 0.5 SENSITIVE Sensitive     CEFTRIAXONE <=0.12 SENSITIVE Sensitive     LEVOFLOXACIN 0.5 SENSITIVE Sensitive     PENICILLIN Value in next row Sensitive      SENSITIVE<=0.06    * STREPTOCOCCUS GROUP G  Blood Culture ID Panel (Reflexed)     Status: Abnormal   Collection Time: 04/17/21 11:58 AM  Result Value Ref Range Status   Enterococcus faecalis NOT DETECTED NOT DETECTED Final   Enterococcus Faecium NOT DETECTED NOT DETECTED Final   Listeria monocytogenes NOT DETECTED NOT DETECTED Final   Staphylococcus species NOT DETECTED NOT DETECTED Final   Staphylococcus aureus (BCID) NOT DETECTED NOT DETECTED Final   Staphylococcus epidermidis NOT DETECTED NOT DETECTED Final   Staphylococcus lugdunensis NOT DETECTED NOT DETECTED Final   Streptococcus species DETECTED (A) NOT DETECTED Final    Comment: Not Enterococcus species, Streptococcus agalactiae, Streptococcus pyogenes, or Streptococcus pneumoniae. CRITICAL RESULT CALLED TO, READ BACK BY AND VERIFIED WITH: JASON ROBBINS ON 04/18/21 AT 0035 QSD    Streptococcus agalactiae NOT DETECTED NOT DETECTED Final   Streptococcus pneumoniae NOT DETECTED NOT DETECTED Final   Streptococcus pyogenes NOT DETECTED NOT DETECTED Final   A.calcoaceticus-baumannii NOT DETECTED  NOT DETECTED Final   Bacteroides fragilis NOT DETECTED NOT DETECTED Final   Enterobacterales NOT DETECTED NOT DETECTED Final   Enterobacter cloacae complex NOT DETECTED NOT DETECTED Final   Escherichia coli NOT DETECTED NOT DETECTED Final   Klebsiella aerogenes NOT DETECTED NOT DETECTED Final   Klebsiella oxytoca NOT DETECTED NOT DETECTED Final   Klebsiella pneumoniae NOT DETECTED NOT DETECTED Final   Proteus species NOT DETECTED NOT DETECTED Final   Salmonella species NOT DETECTED NOT DETECTED Final   Serratia marcescens NOT DETECTED NOT DETECTED Final   Haemophilus influenzae NOT DETECTED NOT DETECTED Final   Neisseria meningitidis NOT DETECTED NOT DETECTED Final   Pseudomonas aeruginosa NOT DETECTED NOT DETECTED Final   Stenotrophomonas maltophilia NOT DETECTED NOT DETECTED Final   Candida albicans NOT DETECTED NOT DETECTED Final   Candida auris NOT DETECTED NOT DETECTED Final   Candida glabrata NOT DETECTED NOT DETECTED Final   Candida krusei NOT DETECTED NOT DETECTED Final   Candida parapsilosis NOT DETECTED NOT DETECTED Final   Candida tropicalis NOT DETECTED NOT DETECTED Final   Cryptococcus neoformans/gattii NOT DETECTED NOT DETECTED Final    Comment: Performed at [facility], [address] [place], [place] [address]  MRSA PCR Screening     Status: None   Collection Time: 04/17/21  3:46 PM   Specimen: Nasal Mucosa; Nasopharyngeal  Result Value Ref Range Status   MRSA by PCR NEGATIVE NEGATIVE Final    Comment:        The GeneXpert MRSA Assay (FDA approved for NASAL specimens only), is one component of a comprehensive MRSA colonization surveillance program. It is not intended to  diagnose MRSA infection nor to guide or monitor treatment for MRSA infections. Performed at [facility], [address] [place], [place] [address]   Culture, blood (single) w Reflex to ID Panel     Status: None (Preliminary result)   Collection Time: 04/20/21  3:37 AM    Specimen: BLOOD  Result Value Ref Range Status   Specimen Description BLOOD LEFT HAND  Final   Special Requests   Final    BOTTLES DRAWN AEROBIC AND ANAEROBIC Blood Culture adequate volume   Culture   Final    NO GROWTH 4 DAYS Performed at [facility], [address] [place], [place] [address]    Report Status PENDING  Incomplete  Culture, blood (single) w Reflex to ID Panel     Status: None (Preliminary result)   Collection Time: 04/23/21 12:53 PM   Specimen: BLOOD  Result Value Ref Range Status   Specimen Description BLOOD BLOOD LEFT HAND  Final   Special Requests   Final    BOTTLES DRAWN AEROBIC AND ANAEROBIC Blood Culture adequate volume   Culture   Final    NO GROWTH < 24 HOURS Performed at [facility], [address], [place], [place] [address]    Report Status PENDING  Incomplete     Time coordinating discharge: Over 30 minutes  SIGNED:   Wyvonnia Dusky, MD  [facility] 04/24/2021, 2:20 PM Pager   If 7PM-7AM, please contact night-coverage

## 2021-04-25 LAB — CULTURE, BLOOD (SINGLE)
Culture: NO GROWTH
Special Requests: ADEQUATE

## 2021-04-26 LAB — BPAM RBC
Blood Product Expiration Date: 202206142359
ISSUE DATE / TIME: 202205201015
Unit Type and Rh: 600

## 2021-04-26 LAB — TYPE AND SCREEN
ABO/RH(D): A NEG
Antibody Screen: NEGATIVE
Unit division: 0

## 2021-04-28 ENCOUNTER — Inpatient Hospital Stay (HOSPITAL_COMMUNITY)
Admission: EM | Admit: 2021-04-28 | Discharge: 2021-05-16 | DRG: 673 | Disposition: A | Discharge status: Home / Self Care | Payer: Medicare (Managed Care) | Attending: Family Medicine | Admitting: Family Medicine

## 2021-04-28 ENCOUNTER — Emergency Department (HOSPITAL_COMMUNITY): Payer: Medicare (Managed Care)

## 2021-04-28 DIAGNOSIS — R5381 Other malaise: Secondary | ICD-10-CM | POA: Diagnosis not present

## 2021-04-28 DIAGNOSIS — Q211 Atrial septal defect: Secondary | ICD-10-CM

## 2021-04-28 DIAGNOSIS — L89302 Pressure ulcer of unspecified buttock, stage 2: Secondary | ICD-10-CM | POA: Diagnosis present

## 2021-04-28 DIAGNOSIS — B955 Unspecified streptococcus as the cause of diseases classified elsewhere: Secondary | ICD-10-CM | POA: Diagnosis present

## 2021-04-28 DIAGNOSIS — M6281 Muscle weakness (generalized): Secondary | ICD-10-CM | POA: Diagnosis not present

## 2021-04-28 DIAGNOSIS — I120 Hypertensive chronic kidney disease with stage 5 chronic kidney disease or end stage renal disease: Principal | ICD-10-CM | POA: Diagnosis present

## 2021-04-28 DIAGNOSIS — I63511 Cerebral infarction due to unspecified occlusion or stenosis of right middle cerebral artery: Secondary | ICD-10-CM | POA: Diagnosis not present

## 2021-04-28 DIAGNOSIS — R7881 Bacteremia: Secondary | ICD-10-CM | POA: Diagnosis present

## 2021-04-28 DIAGNOSIS — Z79899 Other long term (current) drug therapy: Secondary | ICD-10-CM

## 2021-04-28 DIAGNOSIS — K219 Gastro-esophageal reflux disease without esophagitis: Secondary | ICD-10-CM | POA: Diagnosis present

## 2021-04-28 DIAGNOSIS — E039 Hypothyroidism, unspecified: Secondary | ICD-10-CM | POA: Diagnosis present

## 2021-04-28 DIAGNOSIS — E11649 Type 2 diabetes mellitus with hypoglycemia without coma: Secondary | ICD-10-CM | POA: Diagnosis present

## 2021-04-28 DIAGNOSIS — E1122 Type 2 diabetes mellitus with diabetic chronic kidney disease: Secondary | ICD-10-CM | POA: Diagnosis present

## 2021-04-28 DIAGNOSIS — E119 Type 2 diabetes mellitus without complications: Secondary | ICD-10-CM | POA: Diagnosis not present

## 2021-04-28 DIAGNOSIS — G40909 Epilepsy, unspecified, not intractable, without status epilepticus: Secondary | ICD-10-CM | POA: Diagnosis present

## 2021-04-28 DIAGNOSIS — R4189 Other symptoms and signs involving cognitive functions and awareness: Secondary | ICD-10-CM | POA: Diagnosis not present

## 2021-04-28 DIAGNOSIS — N19 Unspecified kidney failure: Secondary | ICD-10-CM | POA: Diagnosis not present

## 2021-04-28 DIAGNOSIS — N179 Acute kidney failure, unspecified: Secondary | ICD-10-CM | POA: Diagnosis present

## 2021-04-28 DIAGNOSIS — T829XXA Unspecified complication of cardiac and vascular prosthetic device, implant and graft, initial encounter: Secondary | ICD-10-CM

## 2021-04-28 DIAGNOSIS — I953 Hypotension of hemodialysis: Secondary | ICD-10-CM | POA: Diagnosis present

## 2021-04-28 DIAGNOSIS — Z7189 Other specified counseling: Secondary | ICD-10-CM | POA: Diagnosis not present

## 2021-04-28 DIAGNOSIS — Z515 Encounter for palliative care: Secondary | ICD-10-CM | POA: Diagnosis not present

## 2021-04-28 DIAGNOSIS — Z9115 Patient's noncompliance with renal dialysis: Secondary | ICD-10-CM | POA: Diagnosis not present

## 2021-04-28 DIAGNOSIS — E1129 Type 2 diabetes mellitus with other diabetic kidney complication: Secondary | ICD-10-CM | POA: Diagnosis present

## 2021-04-28 DIAGNOSIS — F0390 Unspecified dementia without behavioral disturbance: Secondary | ICD-10-CM | POA: Diagnosis present

## 2021-04-28 DIAGNOSIS — L8990 Pressure ulcer of unspecified site, unspecified stage: Secondary | ICD-10-CM | POA: Insufficient documentation

## 2021-04-28 DIAGNOSIS — Z91013 Allergy to seafood: Secondary | ICD-10-CM

## 2021-04-28 DIAGNOSIS — I69354 Hemiplegia and hemiparesis following cerebral infarction affecting left non-dominant side: Secondary | ICD-10-CM | POA: Diagnosis not present

## 2021-04-28 DIAGNOSIS — Z20822 Contact with and (suspected) exposure to covid-19: Secondary | ICD-10-CM | POA: Diagnosis present

## 2021-04-28 DIAGNOSIS — F32A Depression, unspecified: Secondary | ICD-10-CM | POA: Diagnosis present

## 2021-04-28 DIAGNOSIS — G9349 Other encephalopathy: Secondary | ICD-10-CM | POA: Diagnosis not present

## 2021-04-28 DIAGNOSIS — Z9181 History of falling: Secondary | ICD-10-CM

## 2021-04-28 DIAGNOSIS — Z91040 Latex allergy status: Secondary | ICD-10-CM

## 2021-04-28 DIAGNOSIS — N186 End stage renal disease: Secondary | ICD-10-CM | POA: Diagnosis present

## 2021-04-28 DIAGNOSIS — D631 Anemia in chronic kidney disease: Secondary | ICD-10-CM | POA: Diagnosis present

## 2021-04-28 DIAGNOSIS — N2581 Secondary hyperparathyroidism of renal origin: Secondary | ICD-10-CM | POA: Diagnosis present

## 2021-04-28 DIAGNOSIS — I639 Cerebral infarction, unspecified: Secondary | ICD-10-CM | POA: Diagnosis not present

## 2021-04-28 DIAGNOSIS — G928 Other toxic encephalopathy: Secondary | ICD-10-CM | POA: Diagnosis present

## 2021-04-28 DIAGNOSIS — Z888 Allergy status to other drugs, medicaments and biological substances status: Secondary | ICD-10-CM

## 2021-04-28 DIAGNOSIS — Z992 Dependence on renal dialysis: Secondary | ICD-10-CM | POA: Diagnosis not present

## 2021-04-28 DIAGNOSIS — E785 Hyperlipidemia, unspecified: Secondary | ICD-10-CM | POA: Diagnosis present

## 2021-04-28 DIAGNOSIS — I351 Nonrheumatic aortic (valve) insufficiency: Secondary | ICD-10-CM | POA: Diagnosis not present

## 2021-04-28 DIAGNOSIS — I10 Essential (primary) hypertension: Secondary | ICD-10-CM | POA: Diagnosis not present

## 2021-04-28 DIAGNOSIS — Z961 Presence of intraocular lens: Secondary | ICD-10-CM | POA: Diagnosis present

## 2021-04-28 DIAGNOSIS — R4182 Altered mental status, unspecified: Secondary | ICD-10-CM | POA: Diagnosis not present

## 2021-04-28 DIAGNOSIS — E7800 Pure hypercholesterolemia, unspecified: Secondary | ICD-10-CM | POA: Diagnosis present

## 2021-04-28 DIAGNOSIS — R0902 Hypoxemia: Secondary | ICD-10-CM

## 2021-04-28 DIAGNOSIS — Z79890 Hormone replacement therapy (postmenopausal): Secondary | ICD-10-CM

## 2021-04-28 DIAGNOSIS — Z853 Personal history of malignant neoplasm of breast: Secondary | ICD-10-CM

## 2021-04-28 DIAGNOSIS — I6340 Cerebral infarction due to embolism of unspecified cerebral artery: Secondary | ICD-10-CM | POA: Insufficient documentation

## 2021-04-28 DIAGNOSIS — Z91041 Radiographic dye allergy status: Secondary | ICD-10-CM

## 2021-04-28 DIAGNOSIS — A498 Other bacterial infections of unspecified site: Secondary | ICD-10-CM | POA: Insufficient documentation

## 2021-04-28 DIAGNOSIS — G9341 Metabolic encephalopathy: Secondary | ICD-10-CM | POA: Diagnosis not present

## 2021-04-28 DIAGNOSIS — E440 Moderate protein-calorie malnutrition: Secondary | ICD-10-CM | POA: Insufficient documentation

## 2021-04-28 DIAGNOSIS — Z9842 Cataract extraction status, left eye: Secondary | ICD-10-CM

## 2021-04-28 LAB — CBC WITH DIFFERENTIAL/PLATELET
Abs Immature Granulocytes: 0.1 10*3/uL — ABNORMAL HIGH (ref 0.00–0.07)
Basophils Absolute: 0.1 10*3/uL (ref 0.0–0.1)
Basophils Relative: 1 %
Eosinophils Absolute: 0.2 10*3/uL (ref 0.0–0.5)
Eosinophils Relative: 1 %
HCT: 32.2 % — ABNORMAL LOW (ref 36.0–46.0)
Hemoglobin: 10.2 g/dL — ABNORMAL LOW (ref 12.0–15.0)
Immature Granulocytes: 1 %
Lymphocytes Relative: 6 %
Lymphs Abs: 0.9 10*3/uL (ref 0.7–4.0)
MCH: 29.3 pg (ref 26.0–34.0)
MCHC: 31.7 g/dL (ref 30.0–36.0)
MCV: 92.5 fL (ref 80.0–100.0)
Monocytes Absolute: 0.7 10*3/uL (ref 0.1–1.0)
Monocytes Relative: 5 %
Neutro Abs: 12.4 10*3/uL — ABNORMAL HIGH (ref 1.7–7.7)
Neutrophils Relative %: 86 %
Platelets: 488 10*3/uL — ABNORMAL HIGH (ref 150–400)
RBC: 3.48 MIL/uL — ABNORMAL LOW (ref 3.87–5.11)
RDW: 14.6 % (ref 11.5–15.5)
WBC: 14.4 10*3/uL — ABNORMAL HIGH (ref 4.0–10.5)
nRBC: 0 % (ref 0.0–0.2)

## 2021-04-28 LAB — BASIC METABOLIC PANEL
Anion gap: 17 — ABNORMAL HIGH (ref 5–15)
BUN: 62 mg/dL — ABNORMAL HIGH (ref 8–23)
CO2: 23 mmol/L (ref 22–32)
Calcium: 8.6 mg/dL — ABNORMAL LOW (ref 8.9–10.3)
Chloride: 96 mmol/L — ABNORMAL LOW (ref 98–111)
Creatinine, Ser: 8.71 mg/dL — ABNORMAL HIGH (ref 0.44–1.00)
GFR, Estimated: 4 mL/min — ABNORMAL LOW (ref >60)
Glucose, Bld: 124 mg/dL — ABNORMAL HIGH (ref 70–99)
Potassium: 4 mmol/L (ref 3.5–5.1)
Sodium: 136 mmol/L (ref 135–145)

## 2021-04-28 LAB — CULTURE, BLOOD (SINGLE)
Culture: NO GROWTH
Special Requests: ADEQUATE

## 2021-04-28 LAB — RESP PANEL BY RT-PCR (FLU A&B, COVID) ARPGX2
Influenza A by PCR: NEGATIVE
Influenza B by PCR: NEGATIVE
SARS Coronavirus 2 by RT PCR: NEGATIVE

## 2021-04-28 LAB — CBG MONITORING, ED: Glucose-Capillary: 87 mg/dL (ref 70–99)

## 2021-04-28 IMAGING — DX DG CHEST 1V PORT
2 series · 2 of 2 positions shown · non-contrast
Comparison: Chest x-ray [DATE].

CLINICAL DATA: 74-year-old female with history of shortness of
breath.

EXAM:
PORTABLE CHEST 1 VIEW

[chest ap (1 of 2)]
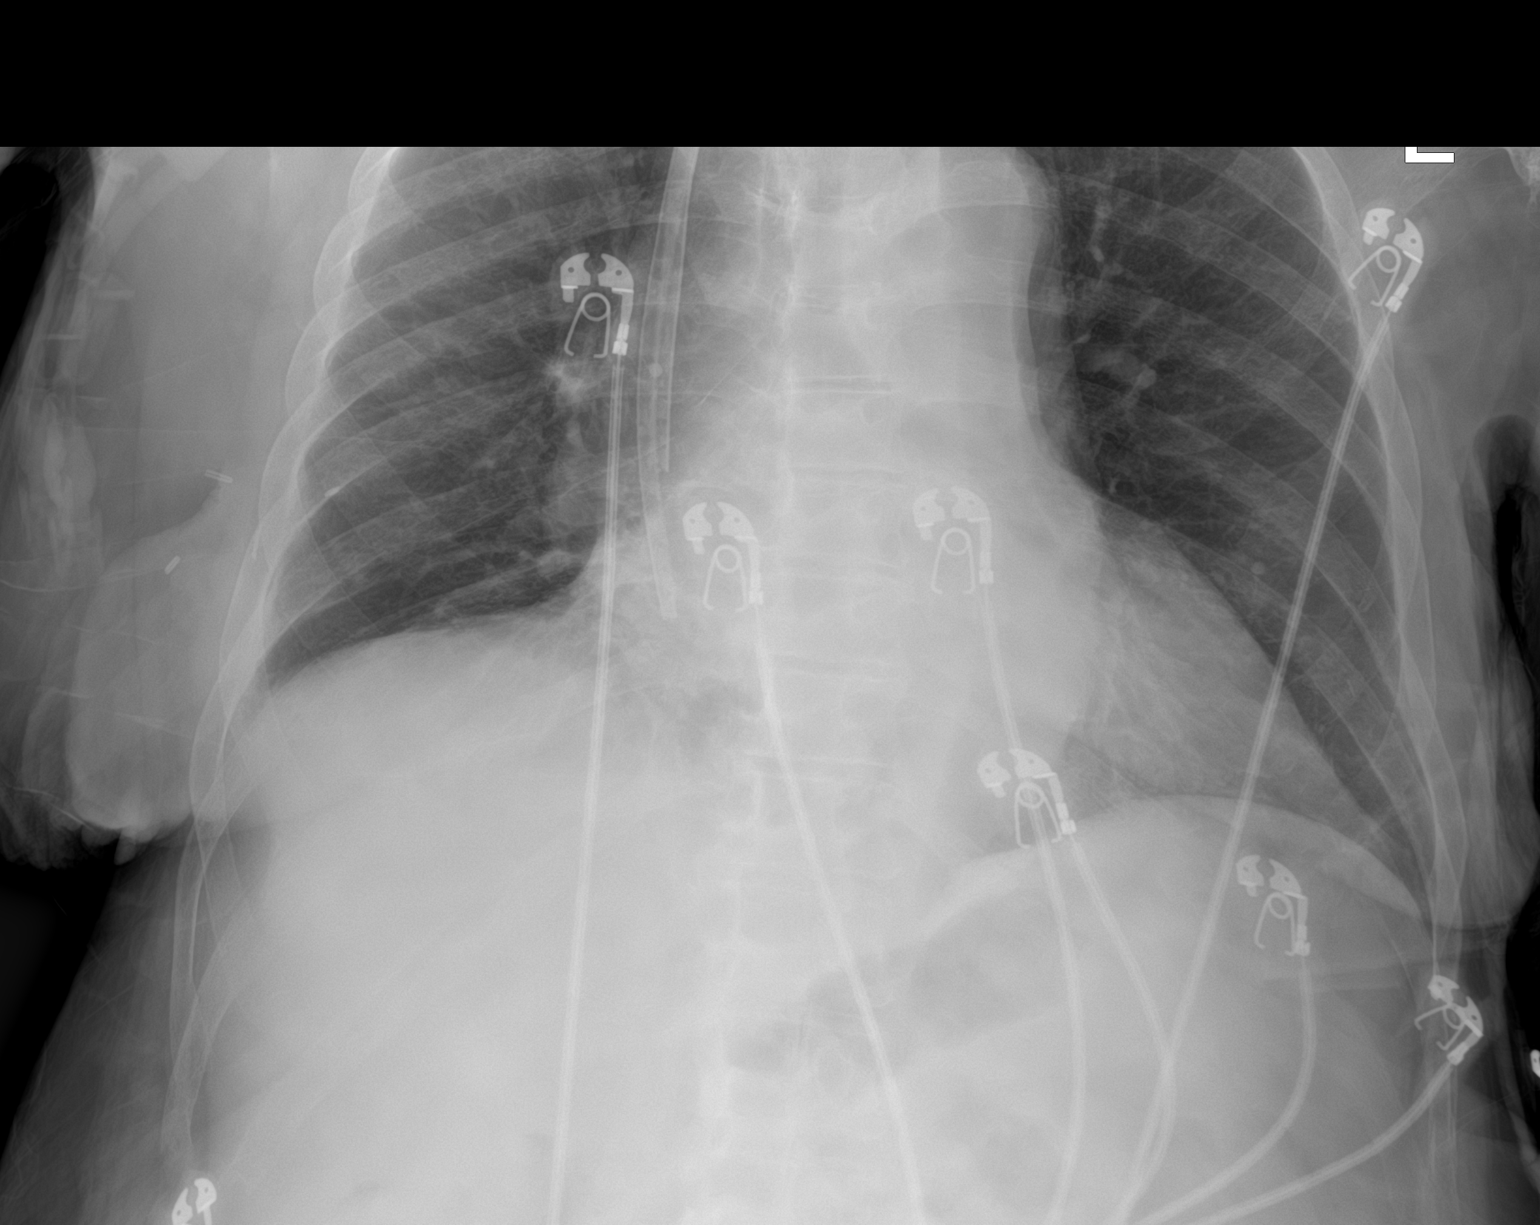

[chest ap (2 of 2)]
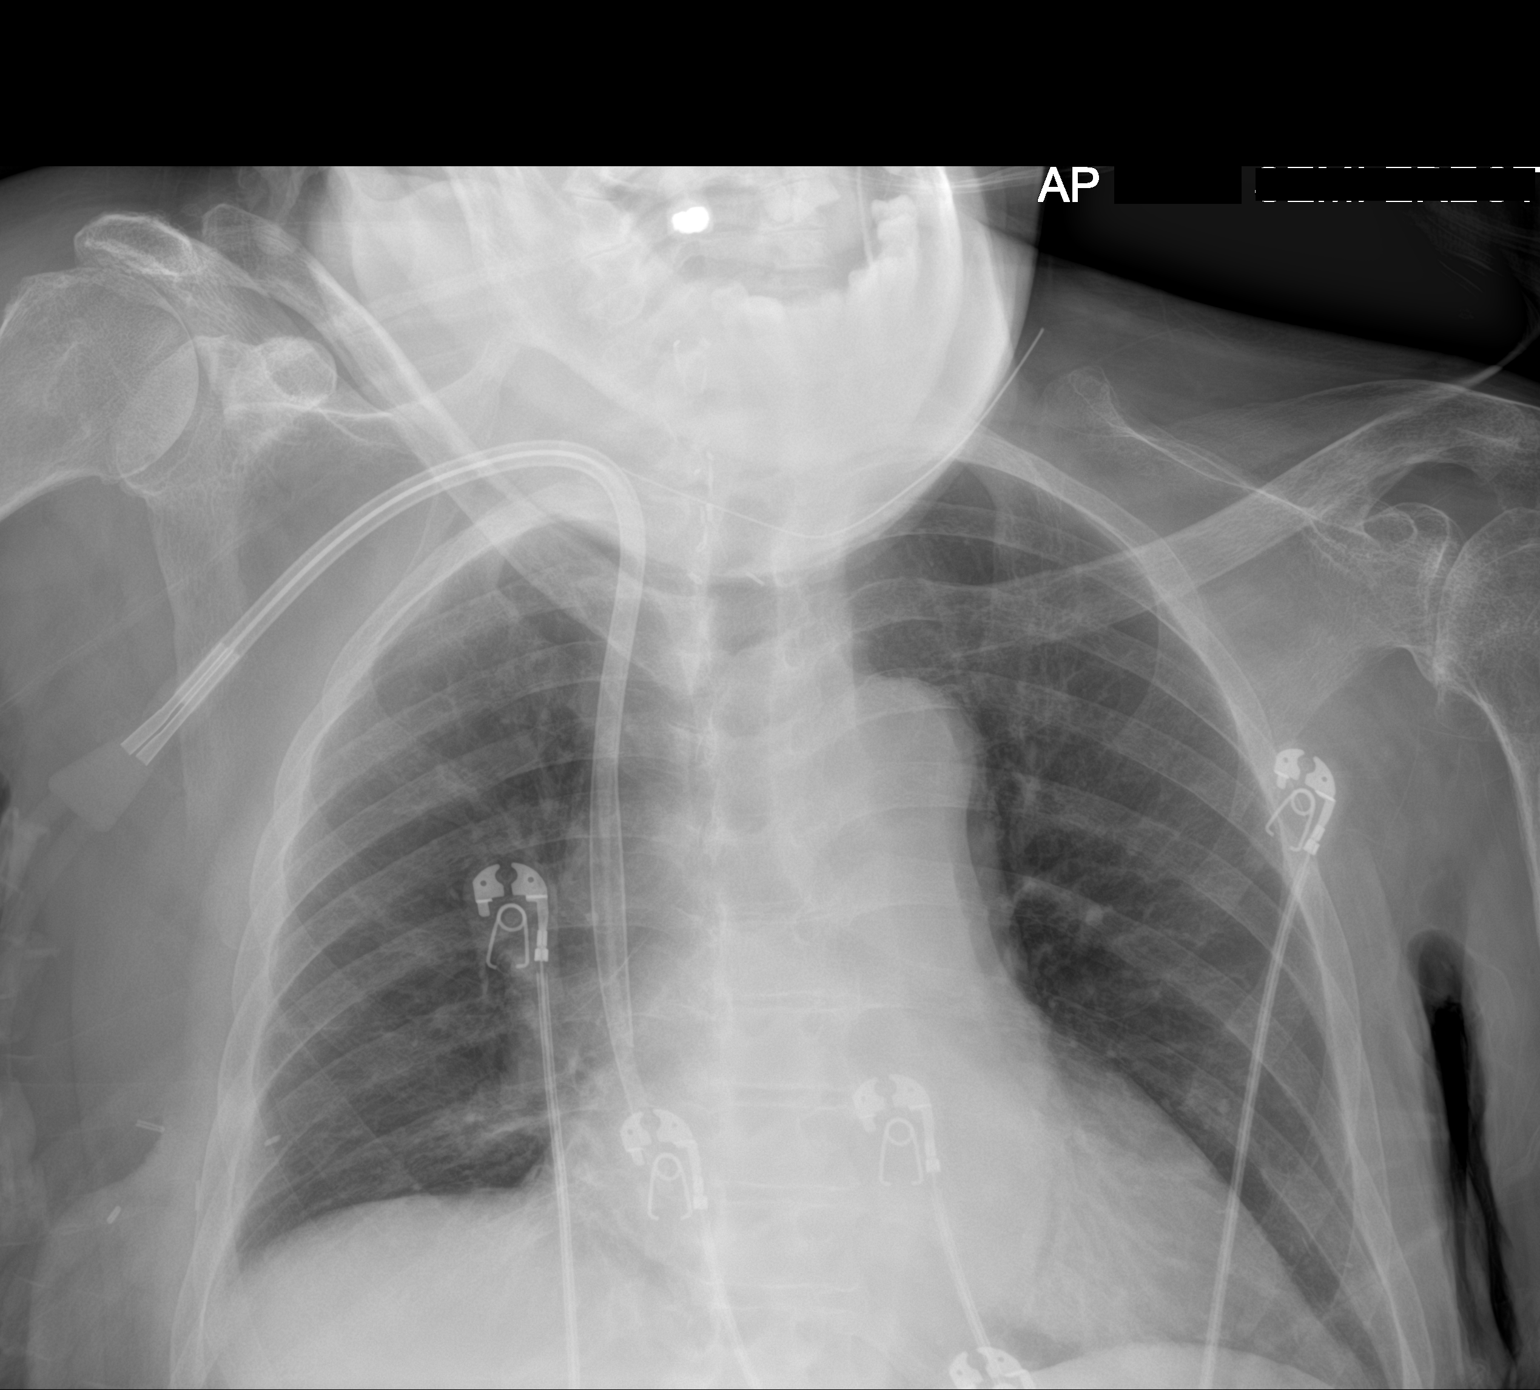

[2 of 2 positions shown; findings below may reference images not displayed]

FINDINGS: Right internal jugular PermCath with tips terminating in the right
atrium. Lung volumes are low. No consolidative airspace disease. No
pleural effusions. No pneumothorax. No pulmonary nodule or mass
noted. Pulmonary vasculature and the cardiomediastinal silhouette
are within normal limits. Surgical clips project over the right
breast, likely from prior lumpectomy.
IMPRESSION: 1. Low lung volumes without radiographic evidence of acute
cardiopulmonary disease.

## 2021-04-28 MED ORDER — INSULIN ASPART 100 UNIT/ML IJ SOLN
0.0000 [IU] | Freq: Every day | INTRAMUSCULAR | Status: DC
  Administered 2021-05-05: 2 [IU] via SUBCUTANEOUS

## 2021-04-28 MED ORDER — LEVETIRACETAM ER 500 MG PO TB24
500.0000 mg | ORAL_TABLET | Freq: Every day | ORAL | Status: DC
  Administered 2021-04-28 – 2021-04-29 (×2): 500 mg via ORAL
  Filled 2021-04-28 (×3): qty 1

## 2021-04-28 MED ORDER — LATANOPROST 0.005 % OP SOLN
1.0000 [drp] | Freq: Every day | OPHTHALMIC | Status: DC
  Administered 2021-04-29 – 2021-05-15 (×17): 1 [drp] via OPHTHALMIC
  Filled 2021-04-28: qty 2.5

## 2021-04-28 MED ORDER — LINAGLIPTIN 5 MG PO TABS
5.0000 mg | ORAL_TABLET | Freq: Every day | ORAL | Status: DC
  Administered 2021-04-29 – 2021-04-30 (×3): 5 mg via ORAL
  Filled 2021-04-28 (×4): qty 1

## 2021-04-28 MED ORDER — METOPROLOL SUCCINATE ER 50 MG PO TB24
50.0000 mg | ORAL_TABLET | Freq: Every day | ORAL | Status: DC
  Administered 2021-04-29 – 2021-05-05 (×5): 50 mg via ORAL
  Filled 2021-04-28 (×2): qty 1
  Filled 2021-04-28: qty 2
  Filled 2021-04-28 (×5): qty 1

## 2021-04-28 MED ORDER — AMLODIPINE BESYLATE 10 MG PO TABS
10.0000 mg | ORAL_TABLET | Freq: Every day | ORAL | Status: DC
  Administered 2021-04-29: 10 mg via ORAL
  Filled 2021-04-28: qty 2
  Filled 2021-04-28: qty 1

## 2021-04-28 MED ORDER — LETROZOLE 2.5 MG PO TABS
2.5000 mg | ORAL_TABLET | Freq: Every day | ORAL | Status: DC
  Administered 2021-04-28 – 2021-05-14 (×15): 2.5 mg via ORAL
  Filled 2021-04-28 (×20): qty 1

## 2021-04-28 MED ORDER — CLONIDINE HCL 0.2 MG/24HR TD PTWK
0.2000 mg | MEDICATED_PATCH | TRANSDERMAL | Status: DC
  Administered 2021-04-29: 0.2 mg via TRANSDERMAL
  Filled 2021-04-28 (×2): qty 1

## 2021-04-28 MED ORDER — VANCOMYCIN HCL 500 MG/100ML IV SOLN
500.0000 mg | INTRAVENOUS | Status: DC
  Filled 2021-04-28: qty 100

## 2021-04-28 MED ORDER — LEVOTHYROXINE SODIUM 112 MCG PO TABS
112.0000 ug | ORAL_TABLET | Freq: Every day | ORAL | Status: DC
  Administered 2021-04-29 – 2021-05-16 (×16): 112 ug via ORAL
  Filled 2021-04-28 (×16): qty 1

## 2021-04-28 MED ORDER — HEPARIN SODIUM (PORCINE) 5000 UNIT/ML IJ SOLN
5000.0000 [IU] | Freq: Three times a day (TID) | INTRAMUSCULAR | Status: DC
  Administered 2021-04-28 – 2021-04-30 (×4): 5000 [IU] via SUBCUTANEOUS
  Filled 2021-04-28 (×4): qty 1

## 2021-04-28 MED ORDER — INSULIN ASPART 100 UNIT/ML IJ SOLN
0.0000 [IU] | Freq: Three times a day (TID) | INTRAMUSCULAR | Status: DC
  Administered 2021-05-06: 1 [IU] via SUBCUTANEOUS

## 2021-04-28 NOTE — ED Notes (Signed)
Called pharmacy to  Verify pt's meds

## 2021-04-28 NOTE — ED Notes (Signed)
Pt current living facility [facility] faxed pt report

## 2021-04-28 NOTE — ED Notes (Signed)
Kirsten Mcmahon [phone] wants to talk to nurse or doctor about mother coming home and him working with her on her medicine

## 2021-04-28 NOTE — ED Provider Notes (Signed)
[facility] EMERGENCY DEPARTMENT Provider Note   CSN: [id number] Arrival date & time: 04/28/21  1405     History Chief Complaint  Patient presents with  . Altered Mental Status    Kirsten Mcmahon is a 75 y.o. female.  75 year old female with prior medical history as detailed below presents for evaluation.  Patient with recent admission and discharge.  Patient apparently was initiated on dialysis this past Saturday.  She presented today for outpatient dialysis.  Apparently the patient told staff that she did not want outpatient dialysis.  Staff sent her to the ED for evaluation.  Upon my evaluation in the ED here at [facility] the patient is without specific complaint.  Patient's son contacted regarding case.  Patient's son reports that the patient apparently argued with staff at dialysis center and refused to have dialysis initiated today.  The patient's son reports that the patient is sometimes argumentative. Level 5 caveat.   The history is provided by the patient and medical records.  Illness Location:  Refused dialysis today Severity:  Mild Onset quality:  Sudden Timing:  Unable to specify Progression:  Unable to specify Chronicity:  New      Past Medical History:  Diagnosis Date  . Cancer [facility]) 2017   Right breast  . Depression   . GERD (gastroesophageal reflux disease)   . Glaucoma   . Hemiparesis ([place])    left side  . High cholesterol   . History of seizure    x 1 - after a spider bite  . History of stroke with residual deficit    left-side weakness  . Hypertension    states BP under control with meds., has been on med. x 2 yr.  . Hypothyroidism   . Non-insulin dependent type 2 diabetes mellitus ([place])   . Overactive bladder   . Stroke [facility])    1998 weakness on left side    Patient Active Problem List   Diagnosis Date Noted  . Streptococcal sepsis, unspecified ([place]) 04/18/2021  . Streptococcal bacteremia 04/18/2021  . Leukocytosis  04/17/2021  . Fever 04/17/2021  . ESRD needing dialysis ([place])   . Acute kidney injury superimposed on CKD ([place]) 04/09/2021  . ARF (acute renal failure) ([place]) 01/18/2021  . Fall 01/17/2021  . Hypertension   . Hypothyroidism   . Stroke ([place])   . GERD (gastroesophageal reflux disease)   . Anemia in chronic kidney disease (CKD)   . Acute metabolic encephalopathy   . Acute renal failure superimposed on stage 3b chronic kidney disease ([place])   . Type II diabetes mellitus with renal manifestations ([place])   . Abnormal mammogram of left breast 07/30/2018  . Closed displaced oblique fracture of shaft of left humerus 03/14/2018  . Osteopenia 01/20/2018  . Multiple thyroid nodules 07/13/2017  . Tracheal deviation 07/13/2017  . Malignant neoplasm of upper-outer quadrant of right breast in female, estrogen receptor positive ([place]) 01/20/2017  . Primary vulvar squamous cell carcinoma ([place]) 01/20/2017    Past Surgical History:  Procedure Laterality Date  . ABDOMINAL HYSTERECTOMY     complete  . BREAST BIOPSY Left 08/03/2018   Benign adipose tissue  . BREAST EXCISIONAL BIOPSY Right 2014   Positive  . BREAST LUMPECTOMY Right   . CATARACT EXTRACTION W/ INTRAOCULAR LENS IMPLANT Left   . CEREBRAL ANEURYSM REPAIR  1998  . DIALYSIS/PERMA CATHETER INSERTION N/A 04/10/2021   Procedure: DIALYSIS/PERMA CATHETER INSERTION;  Surgeon: Algernon Huxley, MD;  Location: [facility];  Service: Cardiovascular;  Laterality: N/A;  . PICC LINE INSERTION    . THYROID LOBECTOMY Right 07/15/2017   Procedure: RIGHT THYROID LOBECTOMY;  Surgeon: Armandina Gemma, MD;  Location: [facility];  Service: General;  Laterality: Right;     OB History   No obstetric history on file.     Family History  Problem Relation Age of Onset  . Cancer Brother        possible prostate cancer per her daughter  . Breast cancer Neg Hx     Social History   Tobacco Use  . Smoking status: Never Smoker  . Smokeless tobacco: Never Used   Vaping Use  . Vaping Use: Never used  Substance Use Topics  . Alcohol use: No  . Drug use: No    Home Medications Prior to Admission medications   Medication Sig Start Date End Date Taking? Authorizing Provider  alendronate (FOSAMAX) 70 MG tablet Take 1 tablet (70 mg total) by mouth once a week. Take with a full glass of water on an empty stomach. Patient not taking: Reported on 04/09/2021 09/07/18   Coral Spikes, DO  amLODipine (NORVASC) 10 MG tablet Take 10 mg by mouth daily.    [provider]  aspirin EC 81 MG tablet Take 81 mg by mouth daily. Patient not taking: No sig reported    [provider]  cholecalciferol (VITAMIN D) 1000 units tablet Take 1,000 Units by mouth daily.    [provider]  cloNIDine (CATAPRES - DOSED IN MG/24 HR) 0.2 mg/24hr patch Place 0.2 mg onto the skin once a week. Wednesday 12/19/20   [provider]  JANUVIA 25 MG tablet Take 25 mg by mouth daily. 10/23/20   [provider]  KEPPRA XR 500 MG 24 hr tablet Take 500 mg by mouth daily. 01/21/21   [provider]  latanoprost (XALATAN) 0.005 % ophthalmic solution Place 1 drop into both eyes at bedtime. 09/07/18   Coral Spikes, DO  letrozole [facility]) 2.5 MG tablet Take 1 tablet (2.5 mg total) by mouth daily. 02/16/19   Nicholas Lose, MD  levothyroxine (SYNTHROID, LEVOTHROID) 112 MCG tablet Take 1 tablet (112 mcg total) by mouth daily before breakfast. Patient not taking: Reported on 04/09/2021 09/07/18   Coral Spikes, DO  loperamide (IMODIUM) 2 MG capsule Take 2 mg by mouth 2 (two) times daily as needed for diarrhea or loose stools. Patient not taking: Reported on 04/09/2021 12/18/20   [provider]  meclizine (ANTIVERT) 25 MG tablet Take 25 mg by mouth daily as needed for dizziness. Patient not taking: Reported on 04/09/2021 12/01/20   [provider]  metoprolol succinate (TOPROL-XL) 25 MG 24 hr tablet Take 50 mg by mouth daily. 08/20/20    [provider]  mirabegron ER (MYRBETRIQ) 25 MG TB24 tablet Take 1 tablet (25 mg total) by mouth daily. Patient not taking: Reported on 04/09/2021 09/07/18   Coral Spikes, DO  TYLENOL 325 MG tablet Take 650 mg by mouth daily as needed for pain. 10/02/20   [provider]  vancomycin (VANCOREADY) 500 MG/100ML IVPB Inject 100 mLs (500 mg total) into the vein every Monday, Wednesday, and Friday with hemodialysis. 04/24/21 06/01/21  Wyvonnia Dusky, MD    Allergies    Contrast media [iodinated diagnostic agents], Latex, Shellfish-derived products, and Levemir [insulin detemir]  Review of Systems   Review of Systems  All other systems reviewed and are negative.   Physical Exam Updated Vital Signs  BP (!) 161/75   Pulse 91   Temp 98.2 F (36.8 C) (Axillary)   Resp 13   SpO2 97%   Physical Exam Vitals and nursing note reviewed.  Constitutional:      General: She is not in acute distress.    Appearance: Normal appearance. She is well-developed.  HENT:     Head: Normocephalic and atraumatic.  Eyes:     Conjunctiva/sclera: Conjunctivae normal.     Pupils: Pupils are equal, round, and reactive to light.  Cardiovascular:     Rate and Rhythm: Normal rate and regular rhythm.     Heart sounds: Normal heart sounds.  Pulmonary:     Effort: Pulmonary effort is normal. No respiratory distress.     Breath sounds: Normal breath sounds.  Abdominal:     General: There is no distension.     Palpations: Abdomen is soft.     Tenderness: There is no abdominal tenderness.  Musculoskeletal:        General: No deformity. Normal range of motion.     Cervical back: Normal range of motion and neck supple.  Skin:    General: Skin is warm and dry.  Neurological:     General: No focal deficit present.     Mental Status: She is alert and oriented to person, place, and time.     ED Results / Procedures / Treatments   Labs (all labs ordered are listed, but only abnormal results  are displayed) Labs Reviewed  BASIC METABOLIC PANEL  CBC WITH DIFFERENTIAL/PLATELET    EKG EKG Interpretation  Date/Time:  Tuesday Apr 28 2021 14:07:12 EDT Ventricular Rate:  92 PR Interval:  116 QRS Duration: 89 QT Interval:  388 QTC Calculation: 480 R Axis:   10 Text Interpretation: Sinus rhythm Borderline short PR interval Anterior infarct, old Minimal ST depression, inferior leads Confirmed by Dene Gentry [phone]) on 04/28/2021 3:12:38 PM   Radiology No results found.  Procedures Procedures   Medications Ordered in ED Medications - No data to display  ED Course  I have reviewed the triage vital signs and the nursing notes.  Pertinent labs & imaging results that were available during my care of the patient were reviewed by me and considered in my medical decision making (see chart for details).    MDM Rules/Calculators/A&P                          MDM  MSE complete  Kirsten Mcmahon was evaluated in Emergency Department on 04/28/2021 for the symptoms described in the history of present illness. She was evaluated in the context of the global COVID-19 pandemic, which necessitated consideration that the patient might be at risk for infection with the SARS-CoV-2 virus that causes COVID-19. Institutional protocols and algorithms that pertain to the evaluation of patients at risk for COVID-19 are in a state of rapid change based on information released by regulatory bodies including the CDC and federal and state organizations. These policies and algorithms were followed during the patient's care in the ED.  Patient presented from dialysis center.  Patient refused dialysis today.  Patient with recent admission in [facility].  Patient was initiated on dialysis there.  Her last dialysis session was on 5/20.  Patient was to start outpatient dialysis this week with a Monday Wednesday Friday schedule.  Apparently, per the son, the patient did not feel like going to  dialysis yesterday.  He was able to get her  to go today but then she refused upon arrival at the center.  Patient is also to be receiving doses of vancomycin for the next 6 weeks after having positive blood cultures while admitted at [facility].  She has not received antibiotics since her discharge.  Patient's case discussed with Dr. Justin Mend of Nephrology.  He agrees with plan for admission and inpatient dialysis tomorrow.  Hospitalist service is aware of case and will evaluate for admission.  Patient's son was contacted multiple times.  He understands plan for admission.  He reports to this provider that he is on the way to the hospital for face-to-face conversation.  Final Clinical Impression(s) / ED Diagnoses Final diagnoses:  ESRD (end stage renal disease) ([place])    Rx / DC Orders ED Discharge Orders    None       Valarie Merino, MD 04/28/21 1737

## 2021-04-28 NOTE — ED Notes (Signed)
Help get patient on the bed from the wheelchair placed patient on the monitor did ekg shown to er provider patient is resting with call bell in reach

## 2021-04-28 NOTE — ED Notes (Signed)
Kirsten Mcmahon her son - [phone] -  To update pt's status

## 2021-04-28 NOTE — H&P (Signed)
History and Physical    Kirsten Mcmahon WNI:[id number] DOB: [birth year] DOA: 04/28/2021  PCP: [facility]   Chief Complaint: Refusal of dialysis  HPI: Kirsten Mcmahon is a 75 y.o. female with medical history significant of ESRD, having just been initiated on dialysis earlier this month, GERD, hypertension, hypothyroidism, non-insulin-dependent diabetes type 2, hyperlipidemia, CVA history, and seizure disorder who presents today from dialysis center after refusing dialysis stating "I already had dialysis today before I was discharged from the hospital" per report.  Lengthy discussion with son over the phone as well as patient in the hospital about need for both ongoing dialysis and antibiotics given patient was recently hospitalized for worsening CKD 4/5 ultimately initiated on dialysis and noted to have streptococcal bacteremia on vancomycin MWF with dialysis through June 27.  It appears patient and family refused TEE at previous facility so it is unclear what our goals of care are.  I discussed with son and patient that if we do not wish to move forward with appropriate care which includes ongoing IV antibiotics and dialysis then it is certainly reasonable to stop these things and consider hospice if that is the wishes of the patient and the family.  In the ED patient was evaluated with unimpressive labs given patient has now missed dialysis for reportedly 2 sessions, BUN 62 creatinine 8.7 hemoglobin 10.2 and a white count of 14.4.  Repeat blood cultures were obtained but patient's repeat cultures from previous hospitalization remain without any growth.  At this time we will admit the patient to the hospital for further evaluation with nephrology and infectious disease to ensure no change in therapy is needed.  Again if family and patient decided that dialysis or IV antibiotics are not something they wish to pursue we can certainly involve palliative care and hospice.  Review of  Systems: As per HPI otherwise no chest pain shortness of breath headache fevers or chills.   Assessment/Plan Active Problems:   AKI (acute kidney injury) ([place])    ESRD with missed dialysis, questionable acute metabolic encephalopathy -Nephrology following, we appreciate insight and recommendations, defer to their expertise for further dialysis, labs, evaluation -If patient continues to refuse dialysis will need to have a discussion about palliative care and hospice -While patient is alert and oriented x4 she does report being discharged from the hospital earlier and does not need dialysis which is concerning since she has been home for more than 48 hours at this point -It is unclear whether the patient has capacity to make these decisions at this time, patient's son has been attempting to obtain medical power of attorney -unclear at what stage of taking over medical care the patient's son is -If necessary will involve psychiatry to assist determining capacity given her alert and oriented status with underlying confusion  Streptococcal bacteremia, POA, does not meet sepsis criteria -Will sideline infectious disease, initial plan was to continue vancomycin through June 27 -I do not expect there to be any change in patient's regimen at this point assuming she continues to be agreeable  Chronic anemia of chronic disease, at baseline -continue to follow morning labs, defer EPO/similar to nephrology Hypertension -resume home meds: Amlodipine, clonidine patch, metoprolol Hypothyroidism -continue home levothyroxine None insulin-dependent diabetes type 2 -linagliptin, sliding scale insulin, diabetic diet Hyperlipidemia -no statin listed continue low-fat diet GERD -supportive care  DVT prophylaxis: Heparin Code Status: Full Family Communication: Son, lengthy discussion over the phone about prognosis disposition and need for medication compliance Status is:  Inpatient  Dispo: The patient is from:  Home              Anticipated d/c is to: To be determined              Anticipated d/c date is: 48 to 72 hours pending clinical course              Patient currently not medically stable for discharge due to ongoing need for work-up and evaluation with nephrology, further discussion with family and patient about goals of care and ultimate disposition location  Consultants:   Nephrology  Procedures:   Dialysis per nephrology   Past Medical History:  Diagnosis Date  . Cancer [facility]) 2017   Right breast  . Depression   . GERD (gastroesophageal reflux disease)   . Glaucoma   . Hemiparesis ([place])    left side  . High cholesterol   . History of seizure    x 1 - after a spider bite  . History of stroke with residual deficit    left-side weakness  . Hypertension    states BP under control with meds., has been on med. x 2 yr.  . Hypothyroidism   . Non-insulin dependent type 2 diabetes mellitus ([place])   . Overactive bladder   . Stroke [facility])    1998 weakness on left side    Past Surgical History:  Procedure Laterality Date  . ABDOMINAL HYSTERECTOMY     complete  . BREAST BIOPSY Left 08/03/2018   Benign adipose tissue  . BREAST EXCISIONAL BIOPSY Right 2014   Positive  . BREAST LUMPECTOMY Right   . CATARACT EXTRACTION W/ INTRAOCULAR LENS IMPLANT Left   . CEREBRAL ANEURYSM REPAIR  1998  . DIALYSIS/PERMA CATHETER INSERTION N/A 04/10/2021   Procedure: DIALYSIS/PERMA CATHETER INSERTION;  Surgeon: Algernon Huxley, MD;  Location: [place] CV LAB;  Service: Cardiovascular;  Laterality: N/A;  . PICC LINE INSERTION    . THYROID LOBECTOMY Right 07/15/2017   Procedure: RIGHT THYROID LOBECTOMY;  Surgeon: Armandina Gemma, MD;  Location: [place];  Service: General;  Laterality: Right;     reports that she has never smoked. She has never used smokeless tobacco. She reports that she does not drink alcohol and does not use drugs.  Allergies  Allergen Reactions  . Contrast Media [Iodinated  Diagnostic Agents]     Other reaction(s): NO ALLERGY  . Latex     Other reaction(s): NO ALLERGY  . Shellfish-Derived Products     Other reaction(s): NO ALLERGY  . Levemir [Insulin Detemir] Itching    Family History  Problem Relation Age of Onset  . Cancer Brother        possible prostate cancer per her daughter  . Breast cancer Neg Hx     Prior to Admission medications   Medication Sig Start Date End Date Taking? Authorizing Provider  alendronate (FOSAMAX) 70 MG tablet Take 1 tablet (70 mg total) by mouth once a week. Take with a full glass of water on an empty stomach. Patient not taking: Reported on 04/09/2021 09/07/18   Coral Spikes, DO  amLODipine (NORVASC) 10 MG tablet Take 10 mg by mouth daily.    [provider]  aspirin EC 81 MG tablet Take 81 mg by mouth daily. Patient not taking: No sig reported    [provider]  cholecalciferol (VITAMIN D) 1000 units tablet Take 1,000 Units by mouth daily.    [provider]  cloNIDine (CATAPRES - DOSED  IN MG/24 HR) 0.2 mg/24hr patch Place 0.2 mg onto the skin once a week. Wednesday 12/19/20   [provider]  JANUVIA 25 MG tablet Take 25 mg by mouth daily. 10/23/20   [provider]  KEPPRA XR 500 MG 24 hr tablet Take 500 mg by mouth daily. 01/21/21   [provider]  latanoprost (XALATAN) 0.005 % ophthalmic solution Place 1 drop into both eyes at bedtime. 09/07/18   Coral Spikes, DO  letrozole [facility]) 2.5 MG tablet Take 1 tablet (2.5 mg total) by mouth daily. 02/16/19   Nicholas Lose, MD  levothyroxine (SYNTHROID, LEVOTHROID) 112 MCG tablet Take 1 tablet (112 mcg total) by mouth daily before breakfast. Patient not taking: Reported on 04/09/2021 09/07/18   Coral Spikes, DO  loperamide (IMODIUM) 2 MG capsule Take 2 mg by mouth 2 (two) times daily as needed for diarrhea or loose stools. Patient not taking: Reported on 04/09/2021 12/18/20   [provider]  meclizine (ANTIVERT) 25 MG  tablet Take 25 mg by mouth daily as needed for dizziness. Patient not taking: Reported on 04/09/2021 12/01/20   [provider]  metoprolol succinate (TOPROL-XL) 25 MG 24 hr tablet Take 50 mg by mouth daily. 08/20/20   [provider]  mirabegron ER (MYRBETRIQ) 25 MG TB24 tablet Take 1 tablet (25 mg total) by mouth daily. Patient not taking: Reported on 04/09/2021 09/07/18   Coral Spikes, DO  TYLENOL 325 MG tablet Take 650 mg by mouth daily as needed for pain. 10/02/20   [provider]  vancomycin (VANCOREADY) 500 MG/100ML IVPB Inject 100 mLs (500 mg total) into the vein every Monday, Wednesday, and Friday with hemodialysis. 04/24/21 06/01/21  Wyvonnia Dusky, MD    Physical Exam: Vitals:   04/28/21 1405 04/28/21 1411 04/28/21 1700  BP: (!) 161/75  (!) 142/86  Pulse: 91    Resp: 13  20  Temp: 98.2 F (36.8 C)    TempSrc: Axillary    SpO2: 97% 97%     Constitutional: NAD, calm, comfortable Vitals:   04/28/21 1405 04/28/21 1411 04/28/21 1700  BP: (!) 161/75  (!) 142/86  Pulse: 91    Resp: 13  20  Temp: 98.2 F (36.8 C)    TempSrc: Axillary    SpO2: 97% 97%    General:  Resting comfortably in bed, No acute distress.  Awake alert oriented to person place and time HEENT:  Normocephalic atraumatic.  Sclerae nonicteric, noninjected.  Extraocular movements intact bilaterally. Neck:  Without mass or deformity.  Trachea is midline. Lungs:  Clear to auscultate bilaterally without rhonchi, wheeze, or rales. Heart:  Regular rate and rhythm.  Without murmurs, rubs, or gallops. Abdomen:  Soft, nontender, nondistended.  Without guarding or rebound. Extremities: Without cyanosis, clubbing, edema, or obvious deformity. Vascular:  Dorsalis pedis and posterior tibial pulses palpable bilaterally. Skin:  Warm and dry, no erythema, no ulcerations.  Labs on Admission: I have personally reviewed following labs and imaging studies  CBC: Recent Labs  Lab 04/22/21 1850  04/23/21 0511 04/24/21 0639 04/28/21 1513  WBC  --  12.7* 10.8* 14.4*  NEUTROABS  --   --   --  12.4*  HGB 8.6* 7.8* 7.1* 10.2*  HCT 26.7* 25.0* 22.2* 32.2*  MCV  --  90.6 90.6 92.5  PLT  --  299 348 174*   Basic Metabolic Panel: Recent Labs  Lab 04/23/21 0511 04/24/21 0639 04/28/21 1513  NA 136 138 136  K 4.0 4.3  4.0  CL 98 99 96*  CO2 26 26 23   GLUCOSE 163* 134* 124*  BUN 34* 49* 62*  CREATININE 4.74* 6.53* 8.71*  CALCIUM 8.2* 8.4* 8.6*   GFR: Estimated Creatinine Clearance: 4.3 mL/min (A) (by C-G formula based on SCr of 8.71 mg/dL (H)). Liver Function Tests: No results for input(s): AST, ALT, ALKPHOS, BILITOT, PROT, ALBUMIN in the last 168 hours. No results for input(s): LIPASE, AMYLASE in the last 168 hours. No results for input(s): AMMONIA in the last 168 hours. Coagulation Profile: No results for input(s): INR, PROTIME in the last 168 hours. Cardiac Enzymes: No results for input(s): CKTOTAL, CKMB, CKMBINDEX, TROPONINI in the last 168 hours. BNP (last 3 results) No results for input(s): PROBNP in the last 8760 hours. HbA1C: No results for input(s): HGBA1C in the last 72 hours. CBG: Recent Labs  Lab 04/22/21 2235 04/23/21 0721 04/23/21 1131 04/23/21 2135 04/24/21 0744  GLUCAP 185* 198* 184* 130* 130*   Lipid Profile: No results for input(s): CHOL, HDL, LDLCALC, TRIG, CHOLHDL, LDLDIRECT in the last 72 hours. Thyroid Function Tests: No results for input(s): TSH, T4TOTAL, FREET4, T3FREE, THYROIDAB in the last 72 hours. Anemia Panel: No results for input(s): VITAMINB12, FOLATE, FERRITIN, TIBC, IRON, RETICCTPCT in the last 72 hours. Urine analysis:    Component Value Date/Time   COLORURINE YELLOW (A) 04/17/2021 1702   APPEARANCEUR HAZY (A) 04/17/2021 1702   LABSPEC 1.015 04/17/2021 1702   PHURINE 7.0 04/17/2021 1702   GLUCOSEU 150 (A) 04/17/2021 1702   HGBUR NEGATIVE 04/17/2021 1702   BILIRUBINUR NEGATIVE 04/17/2021 1702   KETONESUR 5 (A) 04/17/2021  1702   PROTEINUR >=300 (A) 04/17/2021 1702   NITRITE NEGATIVE 04/17/2021 1702   LEUKOCYTESUR NEGATIVE 04/17/2021 1702    Radiological Exams on Admission: DG Chest Port 1 View  Result Date: 04/28/2021 CLINICAL DATA:  75 year old female with history of shortness of breath. EXAM: PORTABLE CHEST 1 VIEW COMPARISON:  Chest x-ray 04/17/2021. FINDINGS: Right internal jugular PermCath with tips terminating in the right atrium. Lung volumes are low. No consolidative airspace disease. No pleural effusions. No pneumothorax. No pulmonary nodule or mass noted. Pulmonary vasculature and the cardiomediastinal silhouette are within normal limits. Surgical clips project over the right breast, likely from prior lumpectomy. IMPRESSION: 1. Low lung volumes without radiographic evidence of acute cardiopulmonary disease. Electronically Signed   By: Vinnie Langton M.D.   On: 04/28/2021 16:11    EKG: Independently reviewed. NSR   Little Ishikawa DO [facility] For contact please use secure messenger on Epic  If 7PM-7AM, please contact night-coverage located on [email or web address]   04/28/2021, 5:46 PM

## 2021-04-28 NOTE — ED Triage Notes (Addendum)
Pt arrived by EMS from Dialysis center. Pt has not been seen a that center before, pt told dialsyis staff that she had gotten her dialysis in the hospital before being discharged today.   Pt refused to allow staff to start her dialysis so they called EMS to take her to ED.   On arrival pt is agitated with staff and requesting to go home. Pt denies any new complaints at this time  Family member told EMS he was coming to ED.

## 2021-04-28 NOTE — ED Notes (Signed)
Report given to [facility] RN

## 2021-04-29 DIAGNOSIS — R7881 Bacteremia: Secondary | ICD-10-CM

## 2021-04-29 DIAGNOSIS — L8990 Pressure ulcer of unspecified site, unspecified stage: Secondary | ICD-10-CM | POA: Insufficient documentation

## 2021-04-29 LAB — CBC
HCT: 31.6 % — ABNORMAL LOW (ref 36.0–46.0)
Hemoglobin: 10.2 g/dL — ABNORMAL LOW (ref 12.0–15.0)
MCH: 29.5 pg (ref 26.0–34.0)
MCHC: 32.3 g/dL (ref 30.0–36.0)
MCV: 91.3 fL (ref 80.0–100.0)
Platelets: 473 10*3/uL — ABNORMAL HIGH (ref 150–400)
RBC: 3.46 MIL/uL — ABNORMAL LOW (ref 3.87–5.11)
RDW: 14.7 % (ref 11.5–15.5)
WBC: 11.7 10*3/uL — ABNORMAL HIGH (ref 4.0–10.5)
nRBC: 0 % (ref 0.0–0.2)

## 2021-04-29 LAB — COMPREHENSIVE METABOLIC PANEL
ALT: 14 U/L (ref 0–44)
AST: 15 U/L (ref 15–41)
Albumin: 2.3 g/dL — ABNORMAL LOW (ref 3.5–5.0)
Alkaline Phosphatase: 58 U/L (ref 38–126)
Anion gap: 15 (ref 5–15)
BUN: 66 mg/dL — ABNORMAL HIGH (ref 8–23)
CO2: 23 mmol/L (ref 22–32)
Calcium: 8.6 mg/dL — ABNORMAL LOW (ref 8.9–10.3)
Chloride: 98 mmol/L (ref 98–111)
Creatinine, Ser: 9.13 mg/dL — ABNORMAL HIGH (ref 0.44–1.00)
GFR, Estimated: 4 mL/min — ABNORMAL LOW (ref >60)
Glucose, Bld: 107 mg/dL — ABNORMAL HIGH (ref 70–99)
Potassium: 4.1 mmol/L (ref 3.5–5.1)
Sodium: 136 mmol/L (ref 135–145)
Total Bilirubin: 0.7 mg/dL (ref 0.3–1.2)
Total Protein: 6.8 g/dL (ref 6.5–8.1)

## 2021-04-29 LAB — GLUCOSE, CAPILLARY
Glucose-Capillary: 95 mg/dL (ref 70–99)
Glucose-Capillary: 93 mg/dL (ref 70–99)
Glucose-Capillary: 102 mg/dL — ABNORMAL HIGH (ref 70–99)
Glucose-Capillary: 95 mg/dL (ref 70–99)

## 2021-04-29 MED ORDER — GERHARDT'S BUTT CREAM
TOPICAL_CREAM | Freq: Two times a day (BID) | CUTANEOUS | Status: AC
  Administered 2021-04-30 – 2021-05-01 (×2): 1 via TOPICAL
  Filled 2021-04-29 (×2): qty 1

## 2021-04-29 MED ORDER — CHLORHEXIDINE GLUCONATE CLOTH 2 % EX PADS
6.0000 | MEDICATED_PAD | Freq: Every day | CUTANEOUS | Status: DC
  Administered 2021-04-29 – 2021-05-05 (×6): 6 via TOPICAL

## 2021-04-29 MED ORDER — VANCOMYCIN HCL IN DEXTROSE 500-5 MG/100ML-% IV SOLN
INTRAVENOUS | Status: AC
  Administered 2021-04-29: 500 mg
  Filled 2021-04-29: qty 100

## 2021-04-29 MED ORDER — HEPARIN SODIUM (PORCINE) 1000 UNIT/ML IJ SOLN
INTRAMUSCULAR | Status: AC
  Filled 2021-04-29: qty 4

## 2021-04-29 MED ORDER — ALTEPLASE 2 MG IJ SOLR
INTRAMUSCULAR | Status: AC
  Administered 2021-04-29: 4 mg
  Filled 2021-04-29: qty 4

## 2021-04-29 NOTE — Consult Note (Addendum)
[facility] Renal Consultation Note    Indication for Consultation:  Management of ESRD/hemodialysis; anemia, hypertension/volume and secondary hyperparathyroidism  [id number]  HPI: Kirsten Mcmahon is a 75 y.o. female with PMH significant for DMT2, HTN, GERD, hypothyroidism, HLD, Hx CVA, seizure disorder and ESRD new start on HD as of 04/10/2021 at [facility].  Patient scheduled to start regular outpatient schedule (MWF) at [facility] this week.  Missed Monday and rescheduled for Tuesday to make up. Patient is a poor historian, majority of history obtained from chart review. Recent admit to [facility] from 5/5-5/20/2022 due to new ESRD requiring dialysis, anemia of CKD, streptococcal bacteremia and AMS/delirium.  Patient son refused TEE and patient discharged on IV Vancomycin with HD until 06/01/21.    Patient sent to the ED from dialysis due to AMS and refusing dialysis.  Per outpatient record patient oriented when she presented at dialysis and was put on the machine but catheter would not run at appropriate speed and required TPA dwell.  She became increasingly confused as time passed, insisting she had already completed dialysis earlier that day and had been taken there against her will.  Decision was made to send her to the ED for evaluation. On presentation to the ED alert and oriented x3 but reported being discharged from the hospital earlier that day after dialysis.  Pertinent findings on admission include K 4.1, BUN 66, SCr 9.13, negative respiratory panel, CXR with low lung volumes and no acute cardiopulmonary disease.    Kirsten Mcmahon seen and examined at bedside in room.  Reports being tired today but otherwise feeling well.  Denies CP, SOB, fever, chills, n/v/d, abdominal pain, edema and orthopnea.  Agrees to have dialysis completed today.  Believes she is at home, in November 2021.  Patient admitted for further evaluation and management.   Past Medical History:   Diagnosis Date  . Cancer [facility]) 2017   Right breast  . Depression   . GERD (gastroesophageal reflux disease)   . Glaucoma   . Hemiparesis ([place])    left side  . High cholesterol   . History of seizure    x 1 - after a spider bite  . History of stroke with residual deficit    left-side weakness  . Hypertension    states BP under control with meds., has been on med. x 2 yr.  . Hypothyroidism   . Non-insulin dependent type 2 diabetes mellitus ([place])   . Overactive bladder   . Stroke [facility])    1998 weakness on left side   Past Surgical History:  Procedure Laterality Date  . ABDOMINAL HYSTERECTOMY     complete  . BREAST BIOPSY Left 08/03/2018   Benign adipose tissue  . BREAST EXCISIONAL BIOPSY Right 2014   Positive  . BREAST LUMPECTOMY Right   . CATARACT EXTRACTION W/ INTRAOCULAR LENS IMPLANT Left   . CEREBRAL ANEURYSM REPAIR  1998  . DIALYSIS/PERMA CATHETER INSERTION N/A 04/10/2021   Procedure: DIALYSIS/PERMA CATHETER INSERTION;  Surgeon: Algernon Huxley, MD;  Location: [place] CV LAB;  Service: Cardiovascular;  Laterality: N/A;  . PICC LINE INSERTION    . THYROID LOBECTOMY Right 07/15/2017   Procedure: RIGHT THYROID LOBECTOMY;  Surgeon: Armandina Gemma, MD;  Location: [facility] OR;  Service: General;  Laterality: Right;   Family History  Problem Relation Age of Onset  . Cancer Brother        possible prostate cancer per her daughter  . Breast cancer Neg Hx  Social History:  reports that she has never smoked. She has never used smokeless tobacco. She reports that she does not drink alcohol and does not use drugs. Allergies  Allergen Reactions  . Contrast Media [Iodinated Diagnostic Agents]     Other reaction(s): NO ALLERGY  . Latex     Other reaction(s): NO ALLERGY  . Shellfish-Derived Products     Other reaction(s): NO ALLERGY  . Levemir [Insulin Detemir] Itching   Prior to Admission medications   Medication Sig Start Date End Date Taking? Authorizing Provider  aspirin EC  81 MG tablet Take 81 mg by mouth daily.   Yes [provider]  cholecalciferol (VITAMIN D) 1000 units tablet Take 1,000 Units by mouth daily.   Yes [provider]  cloNIDine (CATAPRES - DOSED IN MG/24 HR) 0.2 mg/24hr patch Place 0.2 mg onto the skin once a week. Wednesday 12/19/20  Yes [provider]  JANUVIA 25 MG tablet Take 25 mg by mouth daily. 10/23/20  Yes [provider]  KEPPRA XR 500 MG 24 hr tablet Take 500 mg by mouth daily. 01/21/21  Yes [provider]  latanoprost (XALATAN) 0.005 % ophthalmic solution Place 1 drop into both eyes at bedtime. 09/07/18  Yes Cook, Jayce G, DO  letrozole (FEMARA) 2.5 MG tablet Take 1 tablet (2.5 mg total) by mouth daily. 02/16/19  Yes Nicholas Lose, MD  levothyroxine (SYNTHROID, LEVOTHROID) 112 MCG tablet Take 1 tablet (112 mcg total) by mouth daily before breakfast. 09/07/18  Yes Cook, Jayce G, DO  metoprolol succinate (TOPROL-XL) 25 MG 24 hr tablet Take 50 mg by mouth daily. 08/20/20  Yes [provider]  vancomycin (VANCOREADY) 500 MG/100ML IVPB Inject 100 mLs (500 mg total) into the vein every Monday, Wednesday, and Friday with hemodialysis. 04/24/21 06/01/21 Yes Wyvonnia Dusky, MD   Current Facility-Administered Medications  Medication Dose Route Frequency Provider Last Rate Last Admin  . amLODipine (NORVASC) tablet 10 mg  10 mg Oral Daily Little Ishikawa, MD   10 mg at 04/29/21 0002  . Chlorhexidine Gluconate Cloth 2 % PADS 6 each  6 each Topical Q0600 [employer], [place], [place]   6 each at 04/29/21 0857  . cloNIDine (CATAPRES - Dosed in mg/24 hr) patch 0.2 mg  0.2 mg Transdermal Weekly Little Ishikawa, MD   0.2 mg at 04/29/21 0913  . Gerhardt's butt cream   Topical BID Dwyane Dee, MD      . heparin injection 5,000 Units  5,000 Units Subcutaneous Q8H Little Ishikawa, MD   5,000 Units at 04/29/21 0557  . insulin aspart (novoLOG) injection 0-5 Units  0-5 Units Subcutaneous QHS  Little Ishikawa, MD      . insulin aspart (novoLOG) injection 0-6 Units  0-6 Units Subcutaneous TID WC Little Ishikawa, MD      . latanoprost (XALATAN) 0.005 % ophthalmic solution 1 drop  1 drop Both Eyes QHS Little Ishikawa, MD      . letrozole [facility]) tablet 2.5 mg  2.5 mg Oral Daily Little Ishikawa, MD   2.5 mg at 04/29/21 0913  . levETIRAcetam (KEPPRA XR) 24 hr tablet 500 mg  500 mg Oral Daily Little Ishikawa, MD   500 mg at 04/29/21 0913  . levothyroxine (SYNTHROID) tablet 112 mcg  112 mcg Oral QAC breakfast Little Ishikawa, MD   112 mcg at 04/29/21 0557  . linagliptin (TRADJENTA) tablet 5 mg  5 mg Oral Daily Little Ishikawa, MD  5 mg at 04/29/21 0913  . metoprolol succinate (TOPROL-XL) 24 hr tablet 50 mg  50 mg Oral Daily Little Ishikawa, MD   50 mg at 04/29/21 0002  . vancomycin (VANCOREADY) IVPB 500 mg/100 mL  500 mg Intravenous Q M,W,F-HD Little Ishikawa, MD       Labs: Basic Metabolic Panel: Recent Labs  Lab 04/24/21 0639 04/28/21 1513 04/29/21 0316  NA 138 136 136  K 4.3 4.0 4.1  CL 99 96* 98  CO2 26 23 23   GLUCOSE 134* 124* 107*  BUN 49* 62* 66*  CREATININE 6.53* 8.71* 9.13*  CALCIUM 8.4* 8.6* 8.6*   Liver Function Tests: Recent Labs  Lab 04/29/21 0316  AST 15  ALT 14  ALKPHOS 58  BILITOT 0.7  PROT 6.8  ALBUMIN 2.3*   CBC: Recent Labs  Lab 04/23/21 0511 04/24/21 0639 04/28/21 1513 04/29/21 0139  WBC 12.7* 10.8* 14.4* 11.7*  NEUTROABS  --   --  12.4*  --   HGB 7.8* 7.1* 10.2* 10.2*  HCT 25.0* 22.2* 32.2* 31.6*  MCV 90.6 90.6 92.5 91.3  PLT 299 348 488* 473*   Studies/Results: DG Chest Port 1 View  Result Date: 04/28/2021 CLINICAL DATA:  75 year old female with history of shortness of breath. EXAM: PORTABLE CHEST 1 VIEW COMPARISON:  Chest x-ray 04/17/2021. FINDINGS: Right internal jugular PermCath with tips terminating in the right atrium. Lung volumes are low. No consolidative airspace disease. No pleural  effusions. No pneumothorax. No pulmonary nodule or mass noted. Pulmonary vasculature and the cardiomediastinal silhouette are within normal limits. Surgical clips project over the right breast, likely from prior lumpectomy. IMPRESSION: 1. Low lung volumes without radiographic evidence of acute cardiopulmonary disease. Electronically Signed   By: Vinnie Langton M.D.   On: 04/28/2021 16:11    ROS: Limited by AMS.   Physical Exam: Vitals:   04/28/21 2300 04/29/21 0038 04/29/21 0315 04/29/21 0910  BP: 133/73 118/67 105/63 (!) 106/58  Pulse: 91 87 80 84  Resp: 20 20 14 16   Temp:  98.2 F (36.8 C) 98.1 F (36.7 C)   TempSrc:  Oral Oral   SpO2: 95% 92% 95%   Weight:  57 kg    Height:  5\' 2"  (1.575 m)       General: well appearing female in NAD, laying in bed Head: NCAT sclera not icteric MMM Neck: Supple. No lymphadenopathy Lungs: CTA bilaterally. No wheeze, rales or rhonchi. Breathing is unlabored. Heart: RRR. No murmur, rubs or gallops.  Abdomen: soft, nontender, +BS, no guarding, no rebound tenderness Lower extremities:trace edema. No ischemic changes, or open wounds  Neuro: AAOx3. Moves all extremities spontaneously. Psych:  Responds to questions appropriately with a normal affect. Dialysis Access: [facility]  Dialysis Orders:  MWF  - GOC  4hrs, BFR 400, DFR 500,  EDW 57.5kg, 2K/ 2.5Ca  Access: TDC  Heparin none  Assessment/Plan: 1.  AMS - likely multifactorial. Underlying dementia, recent bacteremia and missed dialysis.  Baseline unclear. Primary considering involving psych for capacity and palliative care for goals of care.  2.  ESRD -  Recent new start with MWF schedule but has not had outpatient HD.  Orders written for HD today per regular schedule. K 4.1. d/c Fosamax on discharge as it is not appropriate for dialysis patients.  3. Streptococcal bacteremia - on Vanc until 6/27. Per primary 4.  Hypertension/volume  - BP in goal.  Continue home meds and hold prior to dialysis. Does  not appear grossly volume overloaded.  UF as tolerated.   5.  Anemia of CKD - Hgb 10.2 Not on ESA. Follow trends. 6.  Secondary Hyperparathyroidism -  CCa at goal. Check phos.  Not on VDRA or binders.  7.  Nutrition - Renal diet w/fluid restrictions 8. Seizure disorder - on Keppra 9. DMT2 - per primary  Jen Mow, PA-C [facility] 04/29/2021, 10:02 AM   I have seen and examined this patient and agree with the plan of care. Most important issue is whether she or family member want further dialysis. Palliative care much appreciated and if they don't want dialysis then we should discontinue.    Dwana Melena, MD 04/29/2021, 11:28 AM

## 2021-04-29 NOTE — Progress Notes (Signed)
Progress Note    Kirsten Mcmahon   QZR:[id number]  DOB: 1946-07-03  DOA: 04/28/2021     1  PCP: [facility]  CC: missed HD  Hospital Course: Kirsten Mcmahon is a 75 y.o. female with medical history significant of streptococcal bacteremia (currently on antibiotics), ESRD, having just been initiated on dialysis earlier this month, GERD, hypertension, hypothyroidism, non-insulin-dependent diabetes type 2, hyperlipidemia, CVA history, and seizure disorder who presented from dialysis center after refusing dialysis stating "I already had dialysis today before I was discharged from the hospital" per report.   Discussion was held with son on the phone on admission discussing the importance of ongoing dialysis and antibiotic use. It appears patient and family refused TEE at previous facility, so [facility] may need to continue if further issues with compliance.  Patient was admitted to the hospital for ongoing need for dialysis as well as continuing antibiotics in the setting of her previous diagnosed streptococcal bacteremia.  Interval History:  Seen this afternoon in dialysis.  She was resting comfortably and when given the option for continuing medical treatments or stopping, she did endorse that she wanted to continue.  ROS: Constitutional: negative for chills and fevers, Respiratory: negative for cough, Cardiovascular: negative for chest pain and Gastrointestinal: negative for abdominal pain  Assessment & Plan: ESRD with missed dialysis -Nephrology following, we appreciate insight and recommendations, defer to their expertise for further dialysis, labs, evaluation -If patient continues to refuse dialysis will need to have a discussion about palliative care and hospice; for now, she remains amenable -There is also some question of her decision making ability.  Son has been pursuing obtaining POA in the background  Streptococcal bacteremia, POA - patient remains amenable to  continue treatment - continue Vanc; end date still 6/27  Chronic anemia of chronic disease, at baseline -continue to follow morning labs, defer EPO/similar to nephrology  Hypertension -Pressure low/normal - Hold amlodipine - Continue clonidine patch and metoprolol  Hypothyroidism -continue home levothyroxine Diabetes type 2 -linagliptin, sliding scale insulin, diabetic diet Hyperlipidemia -no statin listed continue low-fat diet GERD -supportive care History of seizures-continue Keppra   Old records reviewed in assessment of this patient  Antimicrobials: Vanc  DVT prophylaxis: heparin injection 5,000 Units Start: 04/28/21 2200   Code Status:   Code Status: Full Code Family Communication:   Disposition Plan: Status is: Inpatient  Remains inpatient appropriate because:IV treatments appropriate due to intensity of illness or inability to take PO and Inpatient level of care appropriate due to severity of illness   Dispo: The patient is from: SNF              Anticipated d/c is to: SNF              Patient currently is not medically stable to d/c.   Difficult to place patient No  Risk of unplanned readmission score: Unplanned Admission- Pilot do not use: 24.11   Objective: Blood pressure (!) 102/57, pulse 92, temperature 98.7 F (37.1 C), temperature source Oral, resp. rate (!) 24, height 5\' 2"  (1.575 m), weight 57.4 kg, SpO2 95 %.  Examination: General appearance: Chronically ill-appearing elderly woman laying in bed in no distress with slowed mentation Head: Normocephalic, without obvious abnormality, atraumatic Eyes: EOMI Lungs: clear to auscultation bilaterally Heart: regular rate and rhythm and S1, S2 normal Abdomen: normal findings: bowel sounds normal and soft, non-tender Extremities: No edema Skin: mobility and turgor normal Neurologic: Grossly normal  Consultants:   Nephrology  Procedures:     Data Reviewed: I have personally reviewed following labs  and imaging studies Results for orders placed or performed during the hospital encounter of 04/28/21 (from the past 24 hour(s))  Resp Panel by RT-PCR (Flu A&B, Covid) Nasopharyngeal Swab     Status: None   Collection Time: 04/28/21  5:47 PM   Specimen: Nasopharyngeal Swab; Nasopharyngeal(NP) swabs in vial transport medium  Result Value Ref Range   SARS Coronavirus 2 by RT PCR NEGATIVE NEGATIVE   Influenza A by PCR NEGATIVE NEGATIVE   Influenza B by PCR NEGATIVE NEGATIVE  CBG monitoring, ED     Status: None   Collection Time: 04/28/21 11:52 PM  Result Value Ref Range   Glucose-Capillary 87 70 - 99 mg/dL  CBC     Status: Abnormal   Collection Time: 04/29/21  1:39 AM  Result Value Ref Range   WBC 11.7 (H) 4.0 - 10.5 K/uL   RBC 3.46 (L) 3.87 - 5.11 MIL/uL   Hemoglobin 10.2 (L) 12.0 - 15.0 g/dL   HCT 31.6 (L) 36.0 - 46.0 %   MCV 91.3 80.0 - 100.0 fL   MCH 29.5 26.0 - 34.0 pg   MCHC 32.3 30.0 - 36.0 g/dL   RDW 14.7 11.5 - 15.5 %   Platelets 473 (H) 150 - 400 K/uL   nRBC 0.0 0.0 - 0.2 %  Comprehensive metabolic panel     Status: Abnormal   Collection Time: 04/29/21  3:16 AM  Result Value Ref Range   Sodium 136 135 - 145 mmol/L   Potassium 4.1 3.5 - 5.1 mmol/L   Chloride 98 98 - 111 mmol/L   CO2 23 22 - 32 mmol/L   Glucose, Bld 107 (H) 70 - 99 mg/dL   BUN 66 (H) 8 - 23 mg/dL   Creatinine, Ser 9.13 (H) 0.44 - 1.00 mg/dL   Calcium 8.6 (L) 8.9 - 10.3 mg/dL   Total Protein 6.8 6.5 - 8.1 g/dL   Albumin 2.3 (L) 3.5 - 5.0 g/dL   AST 15 15 - 41 U/L   ALT 14 0 - 44 U/L   Alkaline Phosphatase 58 38 - 126 U/L   Total Bilirubin 0.7 0.3 - 1.2 mg/dL   GFR, Estimated 4 (L) >60 mL/min   Anion gap 15 5 - 15  Glucose, capillary     Status: None   Collection Time: 04/29/21  7:32 AM  Result Value Ref Range   Glucose-Capillary 95 70 - 99 mg/dL  Glucose, capillary     Status: None   Collection Time: 04/29/21 11:26 AM  Result Value Ref Range   Glucose-Capillary 93 70 - 99 mg/dL    Recent  Results (from the past 240 hour(s))  Culture, blood (single) w Reflex to ID Panel     Status: None   Collection Time: 04/20/21  3:37 AM   Specimen: BLOOD  Result Value Ref Range Status   Specimen Description BLOOD LEFT HAND  Final   Special Requests   Final    BOTTLES DRAWN AEROBIC AND ANAEROBIC Blood Culture adequate volume   Culture   Final    NO GROWTH 5 DAYS Performed at [facility], [address], [place], [place] [address]    Report Status 04/25/2021 FINAL  Final  Culture, blood (single) w Reflex to ID Panel     Status: None   Collection Time: 04/23/21 12:53 PM   Specimen: BLOOD  Result Value Ref Range Status   Specimen Description BLOOD BLOOD  LEFT HAND  Final   Special Requests   Final    BOTTLES DRAWN AEROBIC AND ANAEROBIC Blood Culture adequate volume   Culture   Final    NO GROWTH 5 DAYS Performed at [facility], [address] [place], [place] [address]    Report Status 04/28/2021 FINAL  Final  Resp Panel by RT-PCR (Flu A&B, Covid) Nasopharyngeal Swab     Status: None   Collection Time: 04/28/21  5:47 PM   Specimen: Nasopharyngeal Swab; Nasopharyngeal(NP) swabs in vial transport medium  Result Value Ref Range Status   SARS Coronavirus 2 by RT PCR NEGATIVE NEGATIVE Final    Comment: (NOTE) SARS-CoV-2 target nucleic acids are NOT DETECTED.  The SARS-CoV-2 RNA is generally detectable in upper respiratory specimens during the acute phase of infection. The lowest concentration of SARS-CoV-2 viral copies this assay can detect is 138 copies/mL. A negative result does not preclude SARS-Cov-2 infection and should not be used as the sole basis for treatment or other patient management decisions. A negative result may occur with  improper specimen collection/handling, submission of specimen other than nasopharyngeal swab, presence of viral mutation(s) within the areas targeted by this assay, and inadequate number of viral copies(<138 copies/mL). A  negative result must be combined with clinical observations, patient history, and epidemiological information. The expected result is Negative.  Fact Sheet for Patients:  EntrepreneurPulse.com.au  Fact Sheet for Healthcare Providers:  IncredibleEmployment.be  This test is no t yet approved or cleared by the [place] FDA and  has been authorized for detection and/or diagnosis of SARS-CoV-2 by FDA under an Emergency Use Authorization (EUA). This EUA will remain  in effect (meaning this test can be used) for the duration of the COVID-19 declaration under Section 564(b)(1) of the Act, 21 U.S.C.section 360bbb-3(b)(1), unless the authorization is terminated  or revoked sooner.       Influenza A by PCR NEGATIVE NEGATIVE Final   Influenza B by PCR NEGATIVE NEGATIVE Final    Comment: (NOTE) The Xpert Xpress SARS-CoV-2/FLU/RSV plus assay is intended as an aid in the diagnosis of influenza from Nasopharyngeal swab specimens and should not be used as a sole basis for treatment. Nasal washings and aspirates are unacceptable for Xpert Xpress SARS-CoV-2/FLU/RSV testing.  Fact Sheet for Patients: EntrepreneurPulse.com.au  Fact Sheet for Healthcare Providers: IncredibleEmployment.be  This test is not yet approved or cleared by the [place] FDA and has been authorized for detection and/or diagnosis of SARS-CoV-2 by FDA under an Emergency Use Authorization (EUA). This EUA will remain in effect (meaning this test can be used) for the duration of the COVID-19 declaration under Section 564(b)(1) of the Act, 21 U.S.C. section 360bbb-3(b)(1), unless the authorization is terminated or revoked.  Performed at [facility], [place] [address], [place], [place] [address]      Radiology Studies: DG Chest [place] 1 View  Result Date: 04/28/2021 CLINICAL DATA:  75 year old female with history of shortness of breath.  EXAM: PORTABLE CHEST 1 VIEW COMPARISON:  Chest x-ray 04/17/2021. FINDINGS: Right internal jugular PermCath with tips terminating in the right atrium. Lung volumes are low. No consolidative airspace disease. No pleural effusions. No pneumothorax. No pulmonary nodule or mass noted. Pulmonary vasculature and the cardiomediastinal silhouette are within normal limits. Surgical clips project over the right breast, likely from prior lumpectomy. IMPRESSION: 1. Low lung volumes without radiographic evidence of acute cardiopulmonary disease. Electronically Signed   By: Vinnie Langton M.D.   On: 04/28/2021 16:11  DG Chest Port 1 View  Final Result      Scheduled Meds: . vancomycin      . amLODipine  10 mg Oral Daily  . Chlorhexidine Gluconate Cloth  6 each Topical Q0600  . cloNIDine  0.2 mg Transdermal Weekly  . Gerhardt's butt cream   Topical BID  . heparin  5,000 Units Subcutaneous Q8H  . insulin aspart  0-5 Units Subcutaneous QHS  . insulin aspart  0-6 Units Subcutaneous TID WC  . latanoprost  1 drop Both Eyes QHS  . letrozole  2.5 mg Oral Daily  . levETIRAcetam  500 mg Oral Daily  . levothyroxine  112 mcg Oral QAC breakfast  . linagliptin  5 mg Oral Daily  . metoprolol succinate  50 mg Oral Daily   PRN Meds:  Continuous Infusions: . vancomycin       LOS: 1 day  Time spent: Greater than 50% of the 35 minute visit was spent in counseling/coordination of care for the patient as laid out in the A&P.   Dwyane Dee, MD [facility] 04/29/2021, 4:56 PM

## 2021-04-29 NOTE — Consult Note (Signed)
WOC Nurse Consult Note: Reason for Consult:Moisture associated skin damage to bilateral gluteal folds.  Recent hospitalization revealed blood cultures positive for streptococcal bactremia.  Admitted yesterday after refusing dialysis.  Goals of care are being identified with son, [name].   Wound type:MASD to buttocks Pressure Injury POA: Yes   Pressure and moisture Measurement:each fold with 1 cm x 1 cm x 0.1 cm nonintact lesion in fold.  Wound UYW:XIPP and moist Drainage (amount, consistency, odor) scant weeping Periwound:intact  Dressing procedure/placement/frequency:Gerhardts butt paste twice daily and PRN soilage.  Will not follow at this time.  Please re-consult if needed.  Domenic Moras MSN, RN, FNP-BC CWON Wound, Ostomy, Continence Nurse Pager [phone]

## 2021-04-30 ENCOUNTER — Inpatient Hospital Stay (HOSPITAL_COMMUNITY): Payer: Medicare (Managed Care)

## 2021-04-30 DIAGNOSIS — R5381 Other malaise: Secondary | ICD-10-CM

## 2021-04-30 DIAGNOSIS — N19 Unspecified kidney failure: Secondary | ICD-10-CM

## 2021-04-30 DIAGNOSIS — M6281 Muscle weakness (generalized): Secondary | ICD-10-CM

## 2021-04-30 DIAGNOSIS — G9349 Other encephalopathy: Secondary | ICD-10-CM

## 2021-04-30 HISTORY — PX: IR FLUORO GUIDE CV LINE RIGHT: IMG2283

## 2021-04-30 LAB — CBC WITH DIFFERENTIAL/PLATELET
Abs Immature Granulocytes: 0.07 10*3/uL (ref 0.00–0.07)
Basophils Absolute: 0.1 10*3/uL (ref 0.0–0.1)
Basophils Relative: 1 %
Eosinophils Absolute: 0.1 10*3/uL (ref 0.0–0.5)
Eosinophils Relative: 1 %
HCT: 29.5 % — ABNORMAL LOW (ref 36.0–46.0)
Hemoglobin: 9.4 g/dL — ABNORMAL LOW (ref 12.0–15.0)
Immature Granulocytes: 1 %
Lymphocytes Relative: 9 %
Lymphs Abs: 1 10*3/uL (ref 0.7–4.0)
MCH: 28.8 pg (ref 26.0–34.0)
MCHC: 31.9 g/dL (ref 30.0–36.0)
MCV: 90.5 fL (ref 80.0–100.0)
Monocytes Absolute: 0.7 10*3/uL (ref 0.1–1.0)
Monocytes Relative: 6 %
Neutro Abs: 9.6 10*3/uL — ABNORMAL HIGH (ref 1.7–7.7)
Neutrophils Relative %: 82 %
Platelets: 396 10*3/uL (ref 150–400)
RBC: 3.26 MIL/uL — ABNORMAL LOW (ref 3.87–5.11)
RDW: 14.4 % (ref 11.5–15.5)
WBC: 11.6 10*3/uL — ABNORMAL HIGH (ref 4.0–10.5)
nRBC: 0 % (ref 0.0–0.2)

## 2021-04-30 LAB — BASIC METABOLIC PANEL
Anion gap: 15 (ref 5–15)
BUN: 18 mg/dL (ref 8–23)
CO2: 24 mmol/L (ref 22–32)
Calcium: 7.7 mg/dL — ABNORMAL LOW (ref 8.9–10.3)
Chloride: 97 mmol/L — ABNORMAL LOW (ref 98–111)
Creatinine, Ser: 3.78 mg/dL — ABNORMAL HIGH (ref 0.44–1.00)
GFR, Estimated: 12 mL/min — ABNORMAL LOW (ref >60)
Glucose, Bld: 88 mg/dL (ref 70–99)
Potassium: 3.7 mmol/L (ref 3.5–5.1)
Sodium: 136 mmol/L (ref 135–145)

## 2021-04-30 LAB — PROTIME-INR
INR: 1 (ref 0.8–1.2)
Prothrombin Time: 13.5 seconds (ref 11.4–15.2)

## 2021-04-30 LAB — MAGNESIUM: Magnesium: 2 mg/dL (ref 1.7–2.4)

## 2021-04-30 LAB — GLUCOSE, CAPILLARY
Glucose-Capillary: 122 mg/dL — ABNORMAL HIGH (ref 70–99)
Glucose-Capillary: 68 mg/dL — ABNORMAL LOW (ref 70–99)
Glucose-Capillary: 73 mg/dL (ref 70–99)
Glucose-Capillary: 83 mg/dL (ref 70–99)
Glucose-Capillary: 90 mg/dL (ref 70–99)
Glucose-Capillary: 92 mg/dL (ref 70–99)

## 2021-04-30 LAB — PHOSPHORUS: Phosphorus: 4 mg/dL (ref 2.5–4.6)

## 2021-04-30 IMAGING — XA IR FLUORO GUIDE CV LINE*R*
2 series · 4 of 4 positions shown · non-contrast
Comparison: none

INDICATION: Poorly functioning tunneled dialysis catheter

[Series 1: fl (-) angio · 2 of 2 slices shown (1 of 2)]
[im 1/2]
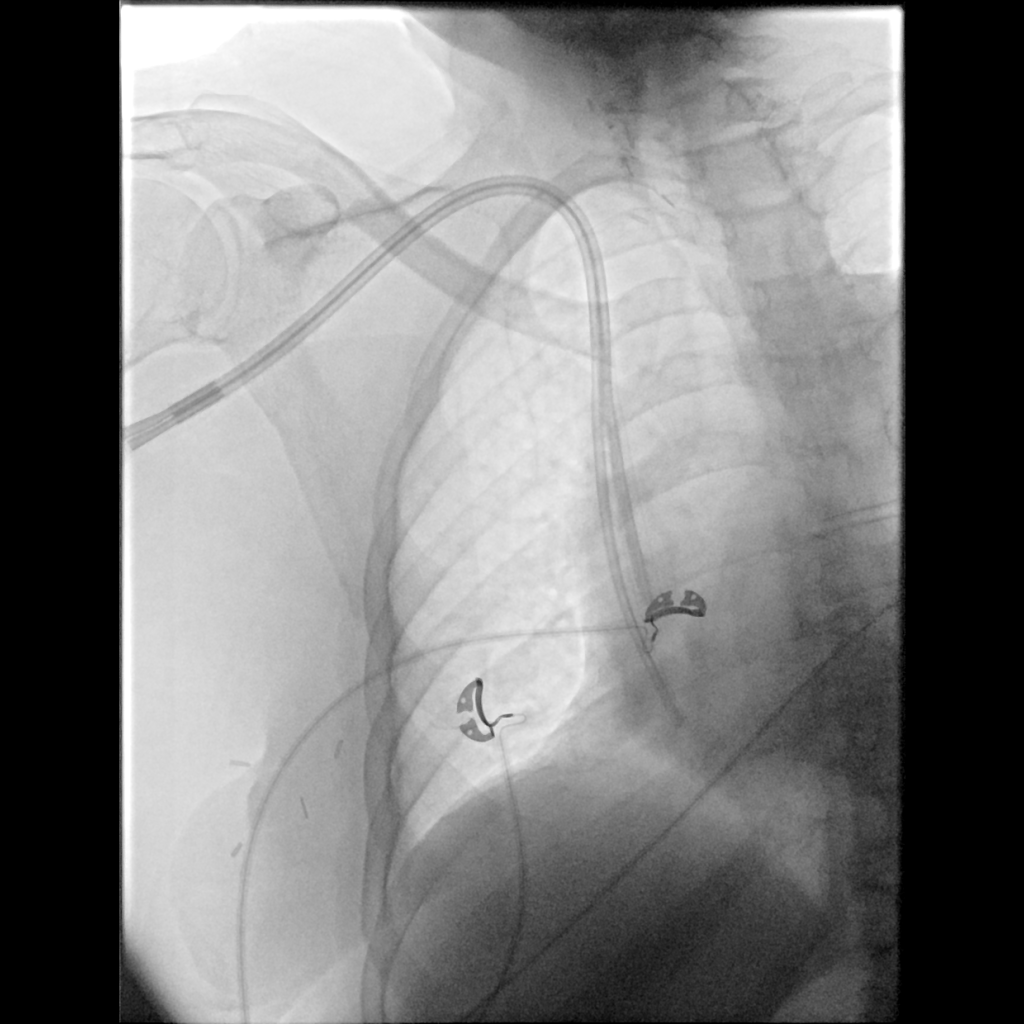
[im 2/2]
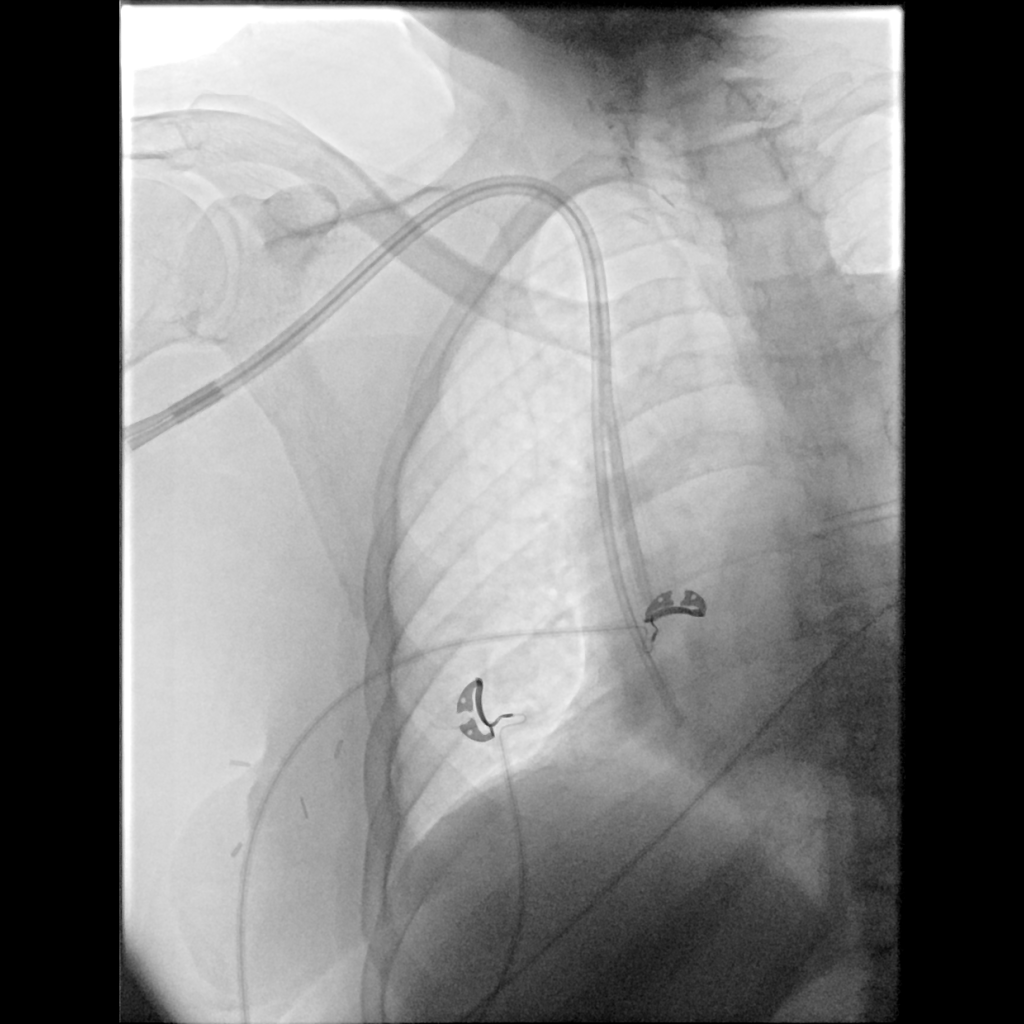

[Series 2: fl (-) angio · 2 of 2 slices shown (2 of 2)]
[im 1/2]
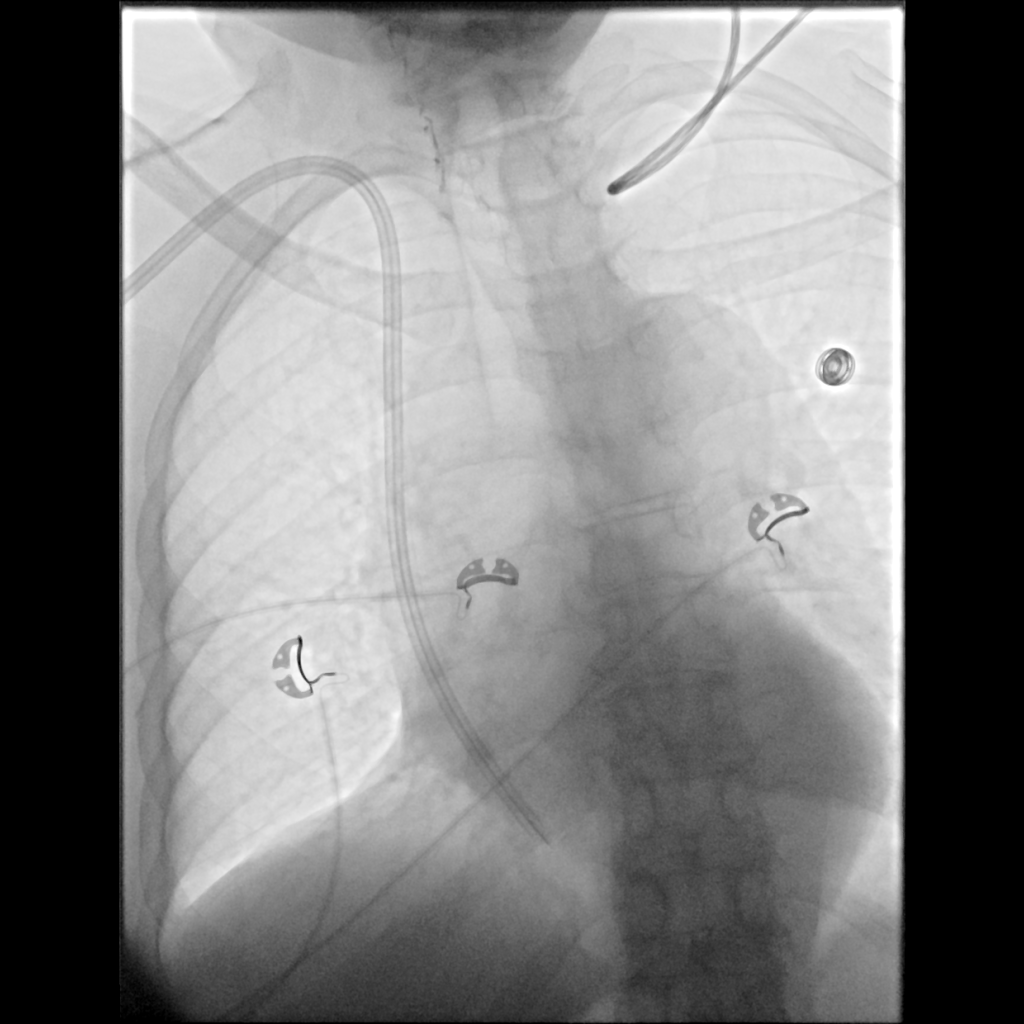
[im 2/2]
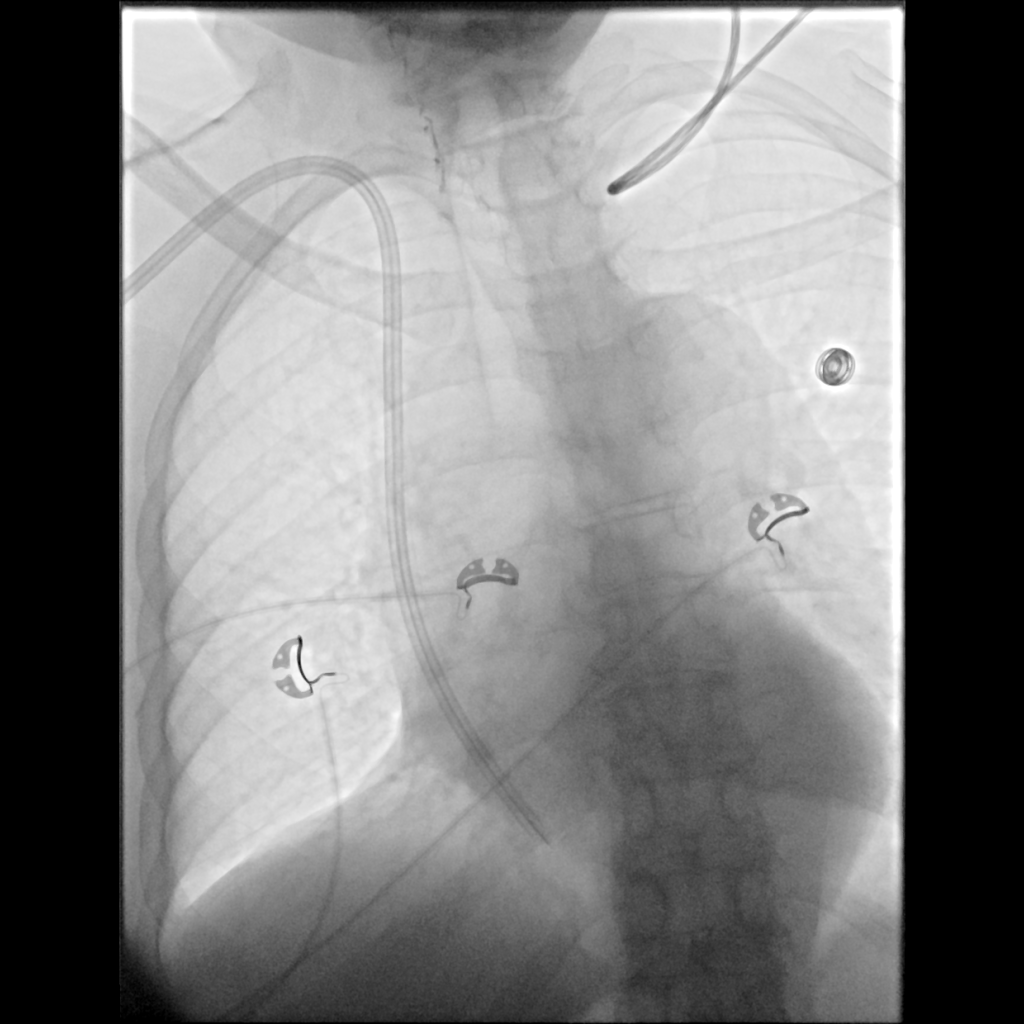

[4 of 4 positions shown; findings below may reference images not displayed]

EXAM:
Tunneled dialysis catheter exchange

MEDICATIONS:
None

ANESTHESIA/SEDATION:
None

FLUOROSCOPY TIME:  Fluoroscopy Time: 0 minutes 30 seconds (1 mGy).

COMPLICATIONS:
None immediate.

PROCEDURE:
Informed written consent was obtained from the patient's son after a
thorough discussion of the procedural risks, benefits and
alternatives. All questions were addressed. Maximal Sterile Barrier
Technique was utilized including caps, mask, sterile gowns, sterile
gloves, sterile drape, hand hygiene and skin antiseptic. A timeout
was performed prior to the initiation of the procedure.

Patient positioned supine on the procedure table. The external
segment of the existing tunneled IJ catheter and surrounding skin
prepped and draped usual fashion. Following local lidocaine
administration, the existing catheter was removed over a 0.035 inch
guidewire under fluoroscopy.

New 23 cm tunneled dialysis catheter was inserted over the
guidewire. Tip positioned in the right atrium. Both lumens aspirated
and flushed well and were locked with heparin. Catheter secured to
skin with suture and insertion site covered with Biopatch and
sterile dressing.
IMPRESSION: Successful exchange of poorly functioning right IJ dialysis
catheter. New 23 cm tunneled hemodialysis catheter in place and
ready for use.

## 2021-04-30 MED ORDER — HEPARIN SODIUM (PORCINE) 1000 UNIT/ML IJ SOLN
INTRAMUSCULAR | Status: AC
  Filled 2021-04-30: qty 1

## 2021-04-30 MED ORDER — HEPARIN SODIUM (PORCINE) 5000 UNIT/ML IJ SOLN
5000.0000 [IU] | Freq: Three times a day (TID) | INTRAMUSCULAR | Status: DC
  Administered 2021-04-30 – 2021-05-16 (×41): 5000 [IU] via SUBCUTANEOUS
  Filled 2021-04-30 (×39): qty 1

## 2021-04-30 MED ORDER — LIDOCAINE HCL 1 % IJ SOLN
INTRAMUSCULAR | Status: AC
  Filled 2021-04-30: qty 20

## 2021-04-30 MED ORDER — LEVETIRACETAM 500 MG PO TABS
500.0000 mg | ORAL_TABLET | Freq: Every day | ORAL | Status: DC
  Administered 2021-04-30 – 2021-05-15 (×14): 500 mg via ORAL
  Filled 2021-04-30 (×14): qty 1

## 2021-04-30 MED ORDER — VANCOMYCIN HCL IN DEXTROSE 500-5 MG/100ML-% IV SOLN: 500.0000 mg | INTRAVENOUS | Status: DC

## 2021-04-30 MED ORDER — HEPARIN SODIUM (PORCINE) 1000 UNIT/ML IJ SOLN
INTRAMUSCULAR | Status: AC | PRN
  Administered 2021-04-30: 1800 [IU] via INTRAVENOUS

## 2021-04-30 MED ORDER — DEXTROSE 50 % IV SOLN
INTRAVENOUS | Status: AC
  Filled 2021-04-30: qty 50

## 2021-04-30 MED ORDER — LEVETIRACETAM 250 MG PO TABS
250.0000 mg | ORAL_TABLET | ORAL | Status: DC
  Administered 2021-05-04 – 2021-05-11 (×4): 250 mg via ORAL
  Filled 2021-04-30 (×4): qty 1

## 2021-04-30 MED ORDER — VANCOMYCIN HCL IN DEXTROSE 500-5 MG/100ML-% IV SOLN
500.0000 mg | INTRAVENOUS | Status: AC
  Administered 2021-04-30 – 2021-05-02 (×2): 500 mg via INTRAVENOUS
  Filled 2021-04-30 (×2): qty 100

## 2021-04-30 MED ORDER — SODIUM CHLORIDE 0.9 % IV SOLN: 5.0000 mg | Freq: Once | INTRAVENOUS | Status: DC

## 2021-04-30 MED ORDER — HEPARIN SODIUM (PORCINE) 1000 UNIT/ML IJ SOLN
INTRAMUSCULAR | Status: AC
  Administered 2021-04-30: 1000 [IU]
  Filled 2021-04-30: qty 1

## 2021-04-30 MED ORDER — DEXTROSE 50 % IV SOLN: 12.5000 g | INTRAVENOUS | Status: AC

## 2021-04-30 MED ORDER — LIDOCAINE HCL 1 % IJ SOLN
INTRAMUSCULAR | Status: AC | PRN
  Administered 2021-04-30: 10 mL

## 2021-04-30 NOTE — Progress Notes (Signed)
Progress Note    Kirsten Mcmahon   YFV:[id number]  DOB: 1946/08/15  DOA: 04/28/2021     2  PCP: [facility]  CC: unintentional missed HD  Hospital Course: Kirsten Mcmahon is a 75 y.o. female with medical history significant of streptococcal bacteremia (currently on Vanc), ESRD, having just been initiated on dialysis earlier this month, GERD, hypertension, hypothyroidism, non-insulin-dependent diabetes type 2, hyperlipidemia, CVA history. Her HPI was further clarified on 04/30/2021 after speaking with her [facility] program.  At baseline, patient is typically oriented and felt to have capacity.  She was in a significant amount of pain at home and was unable to change positions well or sit upright and this was part of the cause for why she missed dialysis prior to admission.  It is presumed that her ensuing uremia contributed to her encephalopathy and the interpretation that she may have been refusing treatment/care. There is also concern for poor health literacy from her son and his ability to understand some of her needs at home.  She does have home health that is in place along with her current [facility] program.  She has become deconditioned   Interval History:  Tolerated dialysis well yesterday.  Seen this morning in bed and she was still significantly lethargic and weak.  She also underwent a dialysis session this morning as well. She is oriented to her name, year.  She could not state the president but once his last name was said she immediately did say his full name.  ROS: Constitutional: negative for chills and fevers, Respiratory: negative for cough, Cardiovascular: negative for chest pain and Gastrointestinal: negative for abdominal pain  Assessment & Plan: ESRD -Nephrology following, continue HD per schedule (planned for MWF) - after clarifying with [facility] program on 04/30/21, patient likely did NOT refuse HD but was most likely encephalopathic from uremia. She typically  has capacity and has been amenable with initiating and continuing dialysis; however, further conversations are still to be had about long-term dialysis which can be had outpatient when she returns back to [facility] - mentation mildly improved compared to yesterday - s/p HD on 5/25 and 5/26. Next session scheduled for 5/28, then likely will pursue d/c home on Sat or Sun and she can continue HD outpatient on MWF next week  Generalized muscle weakness Physical deconditioning - PT consulted.  Etiology mixed in setting of uremia but also from prolonged hospitalization with recent discharge on 04/24/2021 after she was hospitalized for 15 days and had some probable significant deconditioning  Streptococcal bacteremia, POA - patient remains amenable to continue treatment - continue Vanc; end date still 6/27  Chronic anemia of chronic disease, at baseline -continue to follow morning labs, defer EPO/similar to nephrology  Hypertension -Pressure low/normal - Hold amlodipine - Continue clonidine patch and metoprolol  Hypothyroidism -continue home levothyroxine Diabetes type 2 -linagliptin, sliding scale insulin, diabetic diet Hyperlipidemia -no statin listed continue low-fat diet GERD -supportive care History of seizures-continue Keppra   Old records reviewed in assessment of this patient  Antimicrobials: Vanc. End date 06/01/21  DVT prophylaxis: heparin injection 5,000 Units Start: 04/30/21 2200   Code Status:   Code Status: Full Code Family Communication:   Disposition Plan: Status is: Inpatient  Remains inpatient appropriate because:IV treatments appropriate due to intensity of illness or inability to take PO and Inpatient level of care appropriate due to severity of illness   Dispo: The patient is from: Home  Anticipated d/c is to: Home              Patient currently is not medically stable to d/c.   Difficult to place patient No  Risk of unplanned readmission score:  Unplanned Admission- Pilot do not use: 24.48   Objective: Blood pressure 103/65, pulse 98, temperature 99 F (37.2 C), temperature source Oral, resp. rate 20, height 5\' 2"  (1.575 m), weight 55 kg, SpO2 100 %.  Examination: General appearance: Chronically ill-appearing elderly woman laying in bed in no distress with slowed mentation.  She is lethargic but cooperative Head: Normocephalic, without obvious abnormality, atraumatic Eyes: EOMI Lungs: clear to auscultation bilaterally Heart: regular rate and rhythm and S1, S2 normal Abdomen: normal findings: bowel sounds normal and soft, non-tender Extremities: No edema Skin: mobility and turgor normal Neurologic: No focal deficits.  Oriented to name, year, and able to state the president's full name after being told his last name  Consultants:   Nephrology  Procedures:     Data Reviewed: I have personally reviewed following labs and imaging studies Results for orders placed or performed during the hospital encounter of 04/28/21 (from the past 24 hour(s))  Glucose, capillary     Status: Abnormal   Collection Time: 04/29/21  6:58 PM  Result Value Ref Range   Glucose-Capillary 102 (H) 70 - 99 mg/dL  Glucose, capillary     Status: None   Collection Time: 04/29/21  8:43 PM  Result Value Ref Range   Glucose-Capillary 95 70 - 99 mg/dL  Basic metabolic panel     Status: Abnormal   Collection Time: 04/30/21  1:32 AM  Result Value Ref Range   Sodium 136 135 - 145 mmol/L   Potassium 3.7 3.5 - 5.1 mmol/L   Chloride 97 (L) 98 - 111 mmol/L   CO2 24 22 - 32 mmol/L   Glucose, Bld 88 70 - 99 mg/dL   BUN 18 8 - 23 mg/dL   Creatinine, Ser 3.78 (H) 0.44 - 1.00 mg/dL   Calcium 7.7 (L) 8.9 - 10.3 mg/dL   GFR, Estimated 12 (L) >60 mL/min   Anion gap 15 5 - 15  CBC with Differential/Platelet     Status: Abnormal   Collection Time: 04/30/21  1:32 AM  Result Value Ref Range   WBC 11.6 (H) 4.0 - 10.5 K/uL   RBC 3.26 (L) 3.87 - 5.11 MIL/uL    Hemoglobin 9.4 (L) 12.0 - 15.0 g/dL   HCT 29.5 (L) 36.0 - 46.0 %   MCV 90.5 80.0 - 100.0 fL   MCH 28.8 26.0 - 34.0 pg   MCHC 31.9 30.0 - 36.0 g/dL   RDW 14.4 11.5 - 15.5 %   Platelets 396 150 - 400 K/uL   nRBC 0.0 0.0 - 0.2 %   Neutrophils Relative % 82 %   Neutro Abs 9.6 (H) 1.7 - 7.7 K/uL   Lymphocytes Relative 9 %   Lymphs Abs 1.0 0.7 - 4.0 K/uL   Monocytes Relative 6 %   Monocytes Absolute 0.7 0.1 - 1.0 K/uL   Eosinophils Relative 1 %   Eosinophils Absolute 0.1 0.0 - 0.5 K/uL   Basophils Relative 1 %   Basophils Absolute 0.1 0.0 - 0.1 K/uL   Immature Granulocytes 1 %   Abs Immature Granulocytes 0.07 0.00 - 0.07 K/uL  Magnesium     Status: None   Collection Time: 04/30/21  1:32 AM  Result Value Ref Range   Magnesium 2.0 1.7 - 2.4 mg/dL  Phosphorus     Status: None   Collection Time: 04/30/21  1:32 AM  Result Value Ref Range   Phosphorus 4.0 2.5 - 4.6 mg/dL  Glucose, capillary     Status: None   Collection Time: 04/30/21  7:25 AM  Result Value Ref Range   Glucose-Capillary 92 70 - 99 mg/dL  Glucose, capillary     Status: None   Collection Time: 04/30/21  1:00 PM  Result Value Ref Range   Glucose-Capillary 73 70 - 99 mg/dL  Glucose, capillary     Status: Abnormal   Collection Time: 04/30/21  1:35 PM  Result Value Ref Range   Glucose-Capillary 68 (L) 70 - 99 mg/dL  Glucose, capillary     Status: None   Collection Time: 04/30/21  2:21 PM  Result Value Ref Range   Glucose-Capillary 83 70 - 99 mg/dL  Protime-INR     Status: None   Collection Time: 04/30/21  3:33 PM  Result Value Ref Range   Prothrombin Time 13.5 11.4 - 15.2 seconds   INR 1.0 0.8 - 1.2  Glucose, capillary     Status: Abnormal   Collection Time: 04/30/21  4:32 PM  Result Value Ref Range   Glucose-Capillary 122 (H) 70 - 99 mg/dL    Recent Results (from the past 240 hour(s))  Culture, blood (single) w Reflex to ID Panel     Status: None   Collection Time: 04/23/21 12:53 PM   Specimen: BLOOD  Result  Value Ref Range Status   Specimen Description BLOOD BLOOD LEFT HAND  Final   Special Requests   Final    BOTTLES DRAWN AEROBIC AND ANAEROBIC Blood Culture adequate volume   Culture   Final    NO GROWTH 5 DAYS Performed at [facility], [address] [place], [place] [address]    Report Status 04/28/2021 FINAL  Final  Resp Panel by RT-PCR (Flu A&B, Covid) Nasopharyngeal Swab     Status: None   Collection Time: 04/28/21  5:47 PM   Specimen: Nasopharyngeal Swab; Nasopharyngeal(NP) swabs in vial transport medium  Result Value Ref Range Status   SARS Coronavirus 2 by RT PCR NEGATIVE NEGATIVE Final    Comment: (NOTE) SARS-CoV-2 target nucleic acids are NOT DETECTED.  The SARS-CoV-2 RNA is generally detectable in upper respiratory specimens during the acute phase of infection. The lowest concentration of SARS-CoV-2 viral copies this assay can detect is 138 copies/mL. A negative result does not preclude SARS-Cov-2 infection and should not be used as the sole basis for treatment or other patient management decisions. A negative result may occur with  improper specimen collection/handling, submission of specimen other than nasopharyngeal swab, presence of viral mutation(s) within the areas targeted by this assay, and inadequate number of viral copies(<138 copies/mL). A negative result must be combined with clinical observations, patient history, and epidemiological information. The expected result is Negative.  Fact Sheet for Patients:  EntrepreneurPulse.com.au  Fact Sheet for Healthcare Providers:  IncredibleEmployment.be  This test is no t yet approved or cleared by the [place] FDA and  has been authorized for detection and/or diagnosis of SARS-CoV-2 by FDA under an Emergency Use Authorization (EUA). This EUA will remain  in effect (meaning this test can be used) for the duration of the COVID-19 declaration under Section 564(b)(1)  of the Act, 21 U.S.C.section 360bbb-3(b)(1), unless the authorization is terminated  or revoked sooner.       Influenza A by PCR NEGATIVE NEGATIVE Final   Influenza  B by PCR NEGATIVE NEGATIVE Final    Comment: (NOTE) The Xpert Xpress SARS-CoV-2/FLU/RSV plus assay is intended as an aid in the diagnosis of influenza from Nasopharyngeal swab specimens and should not be used as a sole basis for treatment. Nasal washings and aspirates are unacceptable for Xpert Xpress SARS-CoV-2/FLU/RSV testing.  Fact Sheet for Patients: EntrepreneurPulse.com.au  Fact Sheet for Healthcare Providers: IncredibleEmployment.be  This test is not yet approved or cleared by the [place] FDA and has been authorized for detection and/or diagnosis of SARS-CoV-2 by FDA under an Emergency Use Authorization (EUA). This EUA will remain in effect (meaning this test can be used) for the duration of the COVID-19 declaration under Section 564(b)(1) of the Act, 21 U.S.C. section 360bbb-3(b)(1), unless the authorization is terminated or revoked.  Performed at [facility], [place] [address] [place], [place] [address]      Radiology Studies: IR Fluoro Guide CV Line Right  Result Date: 04/30/2021 INDICATION: Poorly functioning tunneled dialysis catheter EXAM: Tunneled dialysis catheter exchange MEDICATIONS: None ANESTHESIA/SEDATION: None FLUOROSCOPY TIME:  Fluoroscopy Time: 0 minutes 30 seconds (1 mGy). COMPLICATIONS: None immediate. PROCEDURE: Informed written consent was obtained from the patient's son after a thorough discussion of the procedural risks, benefits and alternatives. All questions were addressed. Maximal Sterile Barrier Technique was utilized including caps, mask, sterile gowns, sterile gloves, sterile drape, hand hygiene and skin antiseptic. A timeout was performed prior to the initiation of the procedure. Patient positioned supine on the procedure table. The  external segment of the existing tunneled IJ catheter and surrounding skin prepped and draped usual fashion. Following local lidocaine administration, the existing catheter was removed over a 0.035 inch guidewire under fluoroscopy. New 23 cm tunneled dialysis catheter was inserted over the guidewire. Tip positioned in the right atrium. Both lumens aspirated and flushed well and were locked with heparin. Catheter secured to skin with suture and insertion site covered with Biopatch and sterile dressing. IMPRESSION: Successful exchange of poorly functioning right IJ dialysis catheter. New 23 cm tunneled hemodialysis catheter in place and ready for use. Electronically Signed   By: Miachel Roux M.D.   On: 04/30/2021 15:59   IR Fluoro Guide CV Line Right  Final Result    DG Chest Port 1 View  Final Result      Scheduled Meds: . Chlorhexidine Gluconate Cloth  6 each Topical Q0600  . cloNIDine  0.2 mg Transdermal Weekly  . dextrose  12.5 g Intravenous STAT  . dextrose      . Gerhardt's butt cream   Topical BID  . heparin  5,000 Units Subcutaneous Q8H  . heparin sodium (porcine)      . insulin aspart  0-5 Units Subcutaneous QHS  . insulin aspart  0-6 Units Subcutaneous TID WC  . latanoprost  1 drop Both Eyes QHS  . letrozole  2.5 mg Oral Daily  . levETIRAcetam  500 mg Oral Q1500   And  . [START ON 05/01/2021] levETIRAcetam  250 mg Oral Once per day on Mon Wed Fri  . levothyroxine  112 mcg Oral QAC breakfast  . lidocaine      . linagliptin  5 mg Oral Daily  . metoprolol succinate  50 mg Oral Daily  . vancomycin  500 mg Intravenous Q T,Th,Sat-1800  . [START ON 05/06/2021] vancomycin  500 mg Intravenous Q M,W,F-HD   PRN Meds:  Continuous Infusions:    LOS: 2 days  Time spent: Greater than 50% of the 35 minute visit was  spent in counseling/coordination of care for the patient as laid out in the A&P.   Dwyane Dee, MD [facility] 04/30/2021, 4:52 PM

## 2021-04-30 NOTE — Progress Notes (Signed)
Patient Kirsten Mcmahon 61. Orange juice given. Transport arrived for IR Procedure. Report given to IR to recheck in 15 mins.

## 2021-04-30 NOTE — Progress Notes (Signed)
Checked pts CBG upon arrival to IR. CBG 68. Communicated the result to pts nurse, Trecia Rogers. Antwanique asked me to give D50% 1 amp per MD order. Pts IV was found to be infiltrated and was removed. Pt difficult IV stick. I was unable to reestablish IV access. Pt was offered [employer], however, she declined. I communicated all of the above the Antwanique so she could make vascular assess aware of the need of their support in the pts care. Pt transported back to 5W16 on bed.

## 2021-04-30 NOTE — TOC Initial Note (Addendum)
Transition of Care [facility]) - Initial/Assessment Note    Patient Details  Name: Kirsten Mcmahon MRN: [id number] Date of Birth: 08-14-1946  Transition of [facility]) CM/SW Contact:    Verdell Carmine, RN Phone Number: 04/30/2021, 8:28 AM  Clinical Narrative:                  Readmitted after 5 days due to patient refusing dialysis. This was her initial week to start. Conversations from Physician to son and paitent to see if they wanted to continue dialysis nad what outcomes would be in the did not. The patient decided to continue and has had dialysis here. She has Vancomycin after dialysis through June 26th due to [place].  Lives with son, who is active in her care. Last admission, DME was provided, hoyer lift pad.  [facility]  CM will be flowing patient for any further needs at home.  [place]  Will cover her until the end of June.  Because she has moved to [place], [place] [facility] [place] will need to evaluate her and she will change over June 05, 2021. PT has been coming to her house, reorder of PAT and OT for DC. PT consult in currently to evaluate and recommend. [name] is contact [phone]  She stated she saw the patient on Monday and she was in too much pain to go to dialysis. Counseled. son about giving tylenol. She got progressively more confused and when she went to dialysis she refused.   Expected Discharge Plan: [place] Barriers to Discharge: Continued Medical Work up   Patient Goals and CMS Choice        Expected Discharge Plan and Services Expected Discharge Plan: [place]   Discharge Planning Services: CM Consult   Living arrangements for the past 2 months: Single Family Home                                      Prior Living Arrangements/Services Living arrangements for the past 2 months: Single Family Home Lives with:: Adult Children Patient language and need for interpreter reviewed::  Yes        Need for Family Participation in Patient Care: Yes (Comment) Care giver support system in place?: Yes (comment) Current home services: DME Criminal Activity/Legal Involvement Pertinent to Current Situation/Hospitalization: No - Comment as needed  Activities of Daily Living      Permission Sought/Granted         Permission granted to share info w AGENCY: [facility]        Emotional Assessment       Orientation: : Fluctuating Orientation (Suspected and/or reported Sundowners) Alcohol / Substance Use: Not Applicable Psych Involvement: No (comment)  Admission diagnosis:  ESRD (end stage renal disease) ([place]) [N18.6] AKI (acute kidney injury) ([place]) [N17.9] Patient Active Problem List   Diagnosis Date Noted  . Pressure injury of skin 04/29/2021  . AKI (acute kidney injury) ([place]) 04/28/2021  . Streptococcal sepsis, unspecified ([place]) 04/18/2021  . Streptococcal bacteremia 04/18/2021  . Leukocytosis 04/17/2021  . Fever 04/17/2021  . ESRD needing dialysis ([place])   . Acute kidney injury superimposed on CKD ([place]) 04/09/2021  . ARF (acute renal failure) ([place]) 01/18/2021  . Fall 01/17/2021  . Hypertension   . Hypothyroidism   . Stroke ([place])   . GERD (gastroesophageal reflux disease)   .  Anemia in chronic kidney disease (CKD)   . Acute metabolic encephalopathy   . Acute renal failure superimposed on stage 3b chronic kidney disease ([place])   . Type II diabetes mellitus with renal manifestations ([place])   . Abnormal mammogram of left breast 07/30/2018  . Closed displaced oblique fracture of shaft of left humerus 03/14/2018  . Osteopenia 01/20/2018  . Multiple thyroid nodules 07/13/2017  . Tracheal deviation 07/13/2017  . Malignant neoplasm of upper-outer quadrant of right breast in female, estrogen receptor positive ([place]) 01/20/2017  . Primary vulvar squamous cell carcinoma ([place]) 01/20/2017   PCP:  [facility]:   [place], [place] [address] [place] [address] Phone: [phone] Fax: [phone]     Social Determinants of Health (SDOH) Interventions    Readmission Risk Interventions Readmission Risk Prevention Plan 04/11/2021  Transportation Screening Complete  PCP or Specialist Appt within 3-5 Days Complete  HRI or [place] Complete  Social Work Consult for [place] Planning/Counseling Complete  Palliative Care Screening Not Applicable  Medication Review Press photographer) Complete  Some recent data might be hidden

## 2021-04-30 NOTE — Progress Notes (Signed)
Kirsten Mcmahon Progress Note   75 y.o. female DMT2, HTN, GERD, hypothyroidism, HLD, Hx CVA, seizure disorder and ESRD new start on HD as of 04/10/2021 at [facility].   (MWF) at [facility] but refused to be dialyzed at [facility]. Poor historian, recent admit to [facility] from 5/5-5/20/2022 due to new ESRD requiring dialysis, anemia of CKD, streptococcal bacteremia and AMS/delirium.  Patient son refused TEE and patient discharged on IV Vancomycin with HD until 06/01/21.     Assessment/ Plan:   1. AMS - likely multifactorial. Underlying dementia, recent bacteremia and missed dialysis.  Baseline unclear. Primary considering involving psych for capacity and palliative care for goals of care.  2.  ESRD -  Recent new start with MWF schedule but has not had outpatient GO HD.   Tolerated HD 5/25 with net 1.5L UF.  Orders written for HD today and then HD Sat given her missed treatments + hospital logistics.  Then next week back to  regular schedule MWF.  3.  Hypertension/volume  - BP in goal.  Continue home meds and hold prior to dialysis. Does not appear grossly volume overloaded.  UF as tolerated.   4. Streptococcal bacteremia - on Vanc until 6/27. Per primary Anemia of CKD - Hgb 10.2 Not on ESA. Follow trends. 5.  Secondary Hyperparathyroidism -  CCa at goal. Still need to check phos.  Not on VDRA or binders.  6.  Nutrition - Renal diet w/fluid restrictions 7. Seizure disorder - on Keppra 8. DMT2 - per primary   Subjective:   Tolerated HD yest; denies dyspnea/ dyspnea but had some nausea this am.   Objective:   BP 95/63 (BP Location: Left Arm)   Pulse 87   Temp (!) 97.3 F (36.3 C) (Oral)   Resp 18   Ht 5\' 2"  (1.575 m)   Wt 56.9 kg   SpO2 100%   BMI 22.94 kg/m   Intake/Output Summary (Last 24 hours) at 04/30/2021 0705 Last data filed at 04/29/2021 1800 Gross per 24 hour  Intake 75 ml  Output 1500 ml  Net -1425 ml   Weight change: 0.4 kg  Physical Exam: General: NAD NCAT  Lungs:  CTA bilaterally.  Heart: RRR. No murmur, rubs or gallops.  Abdomen: soft, nontender, +BS Lower extremities:trace edema.  Neuro: Alert and thought it was 2021 (close) Moves all extremities spontaneously. Psych:  Responds to questions appropriately with a normal affect. Dialysis Access: RIJ [facility]  Imaging: DG Chest Port 1 View  Result Date: 04/28/2021 CLINICAL DATA:  75 year old female with history of shortness of breath. EXAM: PORTABLE CHEST 1 VIEW COMPARISON:  Chest x-ray 04/17/2021. FINDINGS: Right internal jugular PermCath with tips terminating in the right atrium. Lung volumes are low. No consolidative airspace disease. No pleural effusions. No pneumothorax. No pulmonary nodule or mass noted. Pulmonary vasculature and the cardiomediastinal silhouette are within normal limits. Surgical clips project over the right breast, likely from prior lumpectomy. IMPRESSION: 1. Low lung volumes without radiographic evidence of acute cardiopulmonary disease. Electronically Signed   By: Vinnie Langton M.D.   On: 04/28/2021 16:11    Labs: BMET Recent Labs  Lab 04/24/21 0639 04/28/21 1513 04/29/21 0316 04/30/21 0132  NA 138 136 136 136  K 4.3 4.0 4.1 3.7  CL 99 96* 98 97*  CO2 26 23 23 24   GLUCOSE 134* 124* 107* 88  BUN 49* 62* 66* 18  CREATININE 6.53* 8.71* 9.13* 3.78*  CALCIUM 8.4* 8.6* 8.6* 7.7*   CBC Recent Labs  Lab 04/24/21 0639 04/28/21 1513 04/29/21 0139 04/30/21 0132  WBC 10.8* 14.4* 11.7* 11.6*  NEUTROABS  --  12.4*  --  9.6*  HGB 7.1* 10.2* 10.2* 9.4*  HCT 22.2* 32.2* 31.6* 29.5*  MCV 90.6 92.5 91.3 90.5  PLT 348 488* 473* 396    Medications:    . Chlorhexidine Gluconate Cloth  6 each Topical Q0600  . cloNIDine  0.2 mg Transdermal Weekly  . Gerhardt's butt cream   Topical BID  . heparin  5,000 Units Subcutaneous Q8H  . insulin aspart  0-5 Units Subcutaneous QHS  . insulin aspart  0-6 Units Subcutaneous TID WC  . latanoprost  1 drop Both Eyes QHS  . letrozole  2.5  mg Oral Daily  . levETIRAcetam  500 mg Oral Daily  . levothyroxine  112 mcg Oral QAC breakfast  . linagliptin  5 mg Oral Daily  . metoprolol succinate  50 mg Oral Daily      Otelia Santee, MD 04/30/2021, 7:05 AM

## 2021-04-30 NOTE — Progress Notes (Signed)
PT Cancellation Note  Patient Details Name: Kirsten Mcmahon MRN: [id number] DOB: November 27, 1946   Cancelled Treatment:    Reason Eval/Treat Not Completed: Medical issues which prohibited therapyattempted to see however RN recommends hold due to critically low blood glucose. Will attempt to return later if time/schedule allow.    Windell [place], DPT, PN1   Supplemental Physical Therapist [facility]    Pager [phone] Acute Rehab Office [phone]

## 2021-04-30 NOTE — Procedures (Signed)
Interventional Radiology Procedure Note  Procedure: HD Catheter exchange  Indication: Poorly functioning HD catheter  Findings: Please refer to procedural dictation for full description.  Complications: None  EBL: < 10 mL  Miachel Roux, MD [phone]

## 2021-04-30 NOTE — TOC Progression Note (Signed)
Transition of Care [facility]) - Progression Note    Patient Details  Name: CARALINE DEUTSCHMAN MRN: [id number] Date of Birth: 1946/03/11  Transition of Care [facility]) CM/SW [place], LCSW Phone Number: 04/30/2021, 9:09 AM  Clinical Narrative:    CSW left voicemail for patient's [facility] social worker, Raquel Sarna.    Expected Discharge Plan: [place] Barriers to Discharge: Continued Medical Work up  Expected Discharge Plan and Services Expected Discharge Plan: [place]   Discharge Planning Services: CM Consult   Living arrangements for the past 2 months: Single Family Home                                       Social Determinants of Health (SDOH) Interventions    Readmission Risk Interventions Readmission Risk Prevention Plan 04/11/2021  Transportation Screening Complete  PCP or Specialist Appt within 3-5 Days Complete  HRI or [place] Complete  Social Work Consult for [place] Planning/Counseling Complete  Palliative Care Screening Not Applicable  Medication Review Press photographer) Complete  Some recent data might be hidden

## 2021-04-30 NOTE — Progress Notes (Addendum)
Patients blood sugar 68. Patient alert. Given orange juice and a full Glucerna shake. Will continue to monitor.   Recheck cbg-83. Will continue to monitor.

## 2021-04-30 NOTE — Progress Notes (Signed)
Recheck cbg in IR cbg=68. Phone call to floor RN, MD notified.

## 2021-05-01 DIAGNOSIS — B955 Unspecified streptococcus as the cause of diseases classified elsewhere: Secondary | ICD-10-CM

## 2021-05-01 DIAGNOSIS — M6281 Muscle weakness (generalized): Secondary | ICD-10-CM

## 2021-05-01 DIAGNOSIS — E039 Hypothyroidism, unspecified: Secondary | ICD-10-CM

## 2021-05-01 LAB — GLUCOSE, CAPILLARY
Glucose-Capillary: 92 mg/dL (ref 70–99)
Glucose-Capillary: 136 mg/dL — ABNORMAL HIGH (ref 70–99)
Glucose-Capillary: 115 mg/dL — ABNORMAL HIGH (ref 70–99)
Glucose-Capillary: 73 mg/dL (ref 70–99)

## 2021-05-01 LAB — CBC WITH DIFFERENTIAL/PLATELET
Abs Immature Granulocytes: 0.03 10*3/uL (ref 0.00–0.07)
Basophils Absolute: 0.1 10*3/uL (ref 0.0–0.1)
Basophils Relative: 1 %
Eosinophils Absolute: 0.2 10*3/uL (ref 0.0–0.5)
Eosinophils Relative: 3 %
HCT: 26.9 % — ABNORMAL LOW (ref 36.0–46.0)
Hemoglobin: 8.8 g/dL — ABNORMAL LOW (ref 12.0–15.0)
Immature Granulocytes: 0 %
Lymphocytes Relative: 17 %
Lymphs Abs: 1.3 10*3/uL (ref 0.7–4.0)
MCH: 29.4 pg (ref 26.0–34.0)
MCHC: 32.7 g/dL (ref 30.0–36.0)
MCV: 90 fL (ref 80.0–100.0)
Monocytes Absolute: 0.9 10*3/uL (ref 0.1–1.0)
Monocytes Relative: 12 %
Neutro Abs: 5.1 10*3/uL (ref 1.7–7.7)
Neutrophils Relative %: 67 %
Platelets: 269 10*3/uL (ref 150–400)
RBC: 2.99 MIL/uL — ABNORMAL LOW (ref 3.87–5.11)
RDW: 14.3 % (ref 11.5–15.5)
WBC: 7.6 10*3/uL (ref 4.0–10.5)
nRBC: 0 % (ref 0.0–0.2)

## 2021-05-01 LAB — BASIC METABOLIC PANEL
Anion gap: 9 (ref 5–15)
BUN: 18 mg/dL (ref 8–23)
CO2: 27 mmol/L (ref 22–32)
Calcium: 8 mg/dL — ABNORMAL LOW (ref 8.9–10.3)
Chloride: 98 mmol/L (ref 98–111)
Creatinine, Ser: 3.54 mg/dL — ABNORMAL HIGH (ref 0.44–1.00)
GFR, Estimated: 13 mL/min — ABNORMAL LOW (ref >60)
Glucose, Bld: 96 mg/dL (ref 70–99)
Potassium: 4 mmol/L (ref 3.5–5.1)
Sodium: 134 mmol/L — ABNORMAL LOW (ref 135–145)

## 2021-05-01 LAB — MAGNESIUM: Magnesium: 2 mg/dL (ref 1.7–2.4)

## 2021-05-01 MED ORDER — RENA-VITE PO TABS
1.0000 | ORAL_TABLET | Freq: Every day | ORAL | Status: DC
  Administered 2021-05-02 – 2021-05-15 (×13): 1 via ORAL
  Filled 2021-05-01 (×13): qty 1

## 2021-05-01 MED ORDER — LEVETIRACETAM 250 MG PO TABS
250.0000 mg | ORAL_TABLET | Freq: Once | ORAL | Status: AC
  Administered 2021-05-02: 250 mg via ORAL
  Filled 2021-05-01: qty 1

## 2021-05-01 MED ORDER — PROSOURCE PLUS PO LIQD
30.0000 mL | Freq: Three times a day (TID) | ORAL | Status: DC
  Administered 2021-05-02 – 2021-05-03 (×2): 30 mL via ORAL
  Filled 2021-05-01 (×2): qty 30

## 2021-05-01 MED ORDER — NEPRO/CARBSTEADY PO LIQD
237.0000 mL | Freq: Two times a day (BID) | ORAL | Status: DC
  Administered 2021-05-05 – 2021-05-14 (×12): 237 mL via ORAL
  Filled 2021-05-01 (×3): qty 237

## 2021-05-01 NOTE — Progress Notes (Signed)
PT Cancellation Note  Patient Details Name: CYBIL SENEGAL MRN: [id number] DOB: 13-Sep-1946   Cancelled Treatment:    Reason Eval/Treat Not Completed: Other (comment) MD in room working with patient. Will return later if time/schedule allow.    Windell Norfolk, DPT, PN1   Supplemental Physical Therapist [facility]    Pager [phone] Acute Rehab Office [phone]

## 2021-05-01 NOTE — Progress Notes (Signed)
Initial Nutrition Assessment  DOCUMENTATION CODES:   Not applicable  INTERVENTION:   -Nepro Shake po BID, each supplement provides 425 kcal and 19 grams protein -30 ml Prosource Plus TID, each supplement provides 100 kcals and 15 grams protein -Renal MVI daily -Feeding assistance with meals  NUTRITION DIAGNOSIS:   Increased nutrient needs related to chronic illness (ESRD on HD) as evidenced by estimated needs.  GOAL:   Patient will meet greater than or equal to 90% of their needs  MONITOR:   PO intake,Supplement acceptance,Labs,Weight trends,Skin,I & O's  REASON FOR ASSESSMENT:   Malnutrition Screening Tool    ASSESSMENT:   CARMELIA TINER is a 75 y.o. female with medical history significant of ESRD, having just been initiated on dialysis earlier this month, GERD, hypertension, hypothyroidism, non-insulin-dependent diabetes type 2, hyperlipidemia, CVA history, and seizure disorder who presents today from dialysis center after refusing dialysis stating "I already had dialysis today before I was discharged from the hospital" per report  Pt admitted with ESRD with missed dialysis and questionable acute metabolic encephalopathy.  5/26- s/p HD catheter exchange   Reviewed I/O's: -1 L x 24 hours and -2.4 L since admission  Per MD notes, pt has been refusing HD.   Per [facility] notes, pt with MASD to bilateral gluteal folds.  Pt was sleepy at time of visit, often falling asleep when RD asked questions. She reports that she consumed "almost all"of her breakfast, however, noted documented meal completions 20-25%.  Pt reports she was consuming 1 meal per day PTA. She was unable to provide a diet recall.   Pt is unsure of UBW or EDW. Per nephrology notes, EDW 57.5 kg. Reviewed wt hx; pt has experienced a 23.6% wt loss over the past 3 months, which is significant for time frame.   Medications reviewed and include keppra.  Lab Results  Component Value Date   HGBA1C 5.7 (H)  04/10/2021   PTA DM medications are none.   Labs reviewed: Na: 134, CBGS: 90-136 (inpatient orders for glycemic control are 0-5 units insulin aspart daily at bedtime, 0-6 units insulin aspart TID with meals).   NUTRITION - FOCUSED PHYSICAL EXAM:  Flowsheet Row Most Recent Value  Orbital Region No depletion  Upper Arm Region Mild depletion  Thoracic and Lumbar Region No depletion  Buccal Region No depletion  Temple Region No depletion  Clavicle Bone Region No depletion  Clavicle and Acromion Bone Region No depletion  Scapular Bone Region No depletion  Dorsal Hand No depletion  Patellar Region No depletion  Anterior Thigh Region No depletion  Posterior Calf Region No depletion  Edema (RD Assessment) Mild  Hair Reviewed  Eyes Reviewed  Mouth Reviewed  Skin Reviewed  Nails Reviewed       Diet Order:   Diet Order            Diet renal with fluid restriction Fluid restriction: 1200 mL Fluid; Room service appropriate? Yes; Fluid consistency: Thin  Diet effective now                 EDUCATION NEEDS:   Not appropriate for education at this time  Skin:  Skin Assessment: Skin Integrity Issues: Skin Integrity Issues:: Other (Comment) Other: MASD to bilateral gluteal folds  Last BM:  05/01/21  Height:   Ht Readings from Last 1 Encounters:  04/29/21 5\' 2"  (1.575 m)    Weight:   Wt Readings from Last 1 Encounters:  04/30/21 55 kg    Ideal Body Weight:  50 kg  BMI:  Body mass index is 22.18 kg/m.  Estimated Nutritional Needs:   Kcal:  1650-1850  Protein:  75-90 grams  Fluid:  1000 ml + UOP    [name], RD, LDN, CDCES Registered Dietitian II Certified Diabetes Care and Education Specialist Please refer to [facility] for RD and/or RD on-call/weekend/after hours pager

## 2021-05-01 NOTE — Progress Notes (Signed)
[facility] Progress Note   75 y.o. female DMT2, HTN, GERD, hypothyroidism, HLD, Hx CVA, seizure disorder and ESRD new start on HD as of 04/10/2021 at [facility].   (MWF) at [facility] but refused to be dialyzed at [facility]. Poor historian, recent admit to [facility] from 5/5-5/20/2022 due to new ESRD requiring dialysis, anemia of CKD, streptococcal bacteremia and AMS/delirium.  Patient son refused TEE and patient discharged on IV Vancomycin with HD until 06/01/21.     Assessment/ Plan:   1. AMS - likely multifactorial. Underlying dementia, recent bacteremia and missed dialysis.  Baseline unclear. Primary considering involving psych for capacity and palliative care for goals of care.  2.  ESRD -  Recent new start with MWF schedule but has not had outpatient GO HD.   Tolerated HD 5/25 with net 1.5L UF and 1L 5/26.  HD Sat and then next week back to  regular schedule MWF. She's very lethargic this AM and not cooperative but not combative. She may have a tough time sitting for outpt dialysis in her current condition this AM.  Will try HD in a chair tomorrow.  3.  Hypertension/volume  - BP in goal.  Continue home meds and hold prior to dialysis. Does not appear grossly volume overloaded.  UF as tolerated.   4. Streptococcal bacteremia - on Vanc until 6/27. Per primary Anemia of CKD - Hgb 10.2 Not on ESA. Follow trends. 5.  Secondary Hyperparathyroidism -  CCa at goal. Phos 4 5/26.  Not on VDRA or binders.  6.  Nutrition - Renal diet w/fluid restrictions 7. Seizure disorder - on Keppra 8. DMT2 - per primary   Subjective:   Tolerated HD yest; denies dyspnea but very lethargic this AM.    Objective:   BP (!) 89/64 (BP Location: Right Arm)   Pulse 74   Temp 99.2 F (37.3 C) (Axillary)   Resp 14   Ht 5\' 2"  (1.575 m)   Wt 55 kg   SpO2 100%   BMI 22.18 kg/m   Intake/Output Summary (Last 24 hours) at 05/01/2021 0711 Last data filed at 05/01/2021 0600 Gross per 24 hour  Intake 0 ml  Output  1000 ml  Net -1000 ml   Weight change: -1.3 kg  Physical Exam: General: NAD NCAT Lethargic Lungs: CTA bilaterally.  Heart: RRR. No murmur, rubs or gallops.  Abdomen: soft, nontender, +BS Lower extremities:trace edema.  Psych:  Responds to questions minimally this am Dialysis Access: R[facility]  Imaging: IR Fluoro Guide CV Line Right  Result Date: 04/30/2021 INDICATION: Poorly functioning tunneled dialysis catheter EXAM: Tunneled dialysis catheter exchange MEDICATIONS: None ANESTHESIA/SEDATION: None FLUOROSCOPY TIME:  Fluoroscopy Time: 0 minutes 30 seconds (1 mGy). COMPLICATIONS: None immediate. PROCEDURE: Informed written consent was obtained from the patient's son after a thorough discussion of the procedural risks, benefits and alternatives. All questions were addressed. Maximal Sterile Barrier Technique was utilized including caps, mask, sterile gowns, sterile gloves, sterile drape, hand hygiene and skin antiseptic. A timeout was performed prior to the initiation of the procedure. Patient positioned supine on the procedure table. The external segment of the existing tunneled IJ catheter and surrounding skin prepped and draped usual fashion. Following local lidocaine administration, the existing catheter was removed over a 0.035 inch guidewire under fluoroscopy. New 23 cm tunneled dialysis catheter was inserted over the guidewire. Tip positioned in the right atrium. Both lumens aspirated and flushed well and were locked with heparin. Catheter secured to skin with suture and insertion site  covered with Biopatch and sterile dressing. IMPRESSION: Successful exchange of poorly functioning right IJ dialysis catheter. New 23 cm tunneled hemodialysis catheter in place and ready for use. Electronically Signed   By: Miachel Roux M.D.   On: 04/30/2021 15:59    Labs: BMET Recent Labs  Lab 04/28/21 1513 04/29/21 0316 04/30/21 0132 05/01/21 0058  NA 136 136 136 134*  K 4.0 4.1 3.7 4.0  CL 96* 98 97*  98  CO2 23 23 24 27   GLUCOSE 124* 107* 88 96  BUN 62* 66* 18 18  CREATININE 8.71* 9.13* 3.78* 3.54*  CALCIUM 8.6* 8.6* 7.7* 8.0*  PHOS  --   --  4.0  --    CBC Recent Labs  Lab 04/28/21 1513 04/29/21 0139 04/30/21 0132 05/01/21 0058  WBC 14.4* 11.7* 11.6* 7.6  NEUTROABS 12.4*  --  9.6* 5.1  HGB 10.2* 10.2* 9.4* 8.8*  HCT 32.2* 31.6* 29.5* 26.9*  MCV 92.5 91.3 90.5 90.0  PLT 488* 473* 396 269    Medications:    . Chlorhexidine Gluconate Cloth  6 each Topical Q0600  . cloNIDine  0.2 mg Transdermal Weekly  . dextrose  12.5 g Intravenous STAT  . Gerhardt's butt cream   Topical BID  . heparin  5,000 Units Subcutaneous Q8H  . insulin aspart  0-5 Units Subcutaneous QHS  . insulin aspart  0-6 Units Subcutaneous TID WC  . latanoprost  1 drop Both Eyes QHS  . letrozole  2.5 mg Oral Daily  . levETIRAcetam  500 mg Oral Q1500   And  . levETIRAcetam  250 mg Oral Once per day on Mon Wed Fri  . levothyroxine  112 mcg Oral QAC breakfast  . linagliptin  5 mg Oral Daily  . metoprolol succinate  50 mg Oral Daily  . vancomycin  500 mg Intravenous Q T,Th,Sat-1800  . [START ON 05/06/2021] vancomycin  500 mg Intravenous Q M,W,F-HD      Otelia Santee, MD 05/01/2021, 7:11 AM

## 2021-05-01 NOTE — Care Management Important Message (Signed)
Important Message  Patient Details  Name: Kirsten Mcmahon MRN: [id number] Date of Birth: [birth year]   Medicare Important Message Given:  Yes - Important Message mailed due to current National Emergency   Verbal consent obtained due to current National Emergency  Relationship to patient: Self Contact Name: Larraine Argo Call Date: 05/01/21  Time: 0914 Phone: [phone] Outcome: No Answer/Busy Important Message mailed to: Patient address on file    Delorse Lek 05/01/2021, 9:14 AM

## 2021-05-01 NOTE — Progress Notes (Signed)
Pharmacy Antibiotic Note  Kirsten Mcmahon is a 75 y.o. female admitted on 04/28/2021 with group B Strep bacteremia.  Pharmacy has been consulted for vancomycin dosing in patient with ESRD - HD MWF. Currently off normal HD schedule - next 5/28 then back to MWF per Nephro note. End date for abx 06/01/21.   Afebrile and WBC is normal.  Plan: Vancomycin 500 mg IV after HD on HD days Consider pre-HD level at steady-state   Height: 5\' 2"  (157.5 cm) Weight: 55 kg (121 lb 4.1 oz) IBW/kg (Calculated) : 50.1  Temp (24hrs), Avg:99.3 F (37.4 C), Min:99 F (37.2 C), Max:99.6 F (37.6 C)  Recent Labs  Lab 04/28/21 1513 04/29/21 0139 04/29/21 0316 04/30/21 0132 05/01/21 0058  WBC 14.4* 11.7*  --  11.6* 7.6  CREATININE 8.71*  --  9.13* 3.78* 3.54*    Estimated Creatinine Clearance: 10.7 mL/min (A) (by C-G formula based on SCr of 3.54 mg/dL (H)).    Allergies  Allergen Reactions  . Contrast Media [Iodinated Diagnostic Agents]     Other reaction(s): NO ALLERGY  . Latex     Other reaction(s): NO ALLERGY  . Shellfish-Derived Products     Other reaction(s): NO ALLERGY  . Levemir [Insulin Detemir] Itching    Thank you for involving pharmacy in this patient's care.  Renold Genta, PharmD, BCPS Clinical Pharmacist Clinical phone for 05/01/2021 until 3p is [phone] 05/01/2021 9:33 AM  **Pharmacist phone directory can be found on [email or web address] listed under [place]**

## 2021-05-01 NOTE — Evaluation (Signed)
Physical Therapy Evaluation Patient Details Name: Kirsten Mcmahon MRN: [id number] DOB: 10-08-1946 Today's Date: 05/01/2021   History of Present Illness  75yo female admitted 04/28/21 from an HD center after refusing dialysis. Also thought to have acute metabolic encepthalopathy. PMH breast CA, CVA with L residual hemiplegia, seizures, HTN, R fronto-temporal craniotomy  Clinical Impression   Patient received in bed with strong R lean. Very confused this morning, but pleasant. Unfortunately would not allow me to really assess much as she yelled "dont' hurt me and I won't hurt you, that hurts!" any time I even touched her lightly today. Per past PT notes, looks like she was recently able to participate and had purposeful, meaningful participation in therapy sessions although at a SNF level. Noted that BP cuff was placed on hemiplegic L UE, possibly contributing to low blood pressure measures- but patient refused to allow me to reposition or even loosen the cuff, so RN notified. Left in bed with all needs met, bed alarm active. Would very much benefit from in home care and 24/7A from [facility] program.     Follow Up Recommendations Other (comment);Supervision/Assistance - 24 hour (PACE participant. Would certainly recommend in-home care from PACE if this is available)    [facility] bed;Wheelchair (measurements PT);Wheelchair cushion (measurements PT);Other (comment) (hoyer lift and pads)    Recommendations for Other Services       Precautions / Restrictions Precautions Precautions: Fall;Other (comment) Precaution Comments: R perm-cath; history of R fronto-temporal craniectomy with musculocutaneous flap over site Restrictions Weight Bearing Restrictions: No      Mobility  Bed Mobility               General bed mobility comments: refused- kept crying out "don't hurt me and I won't hurt you" every time PT touched her, even lightly    Transfers                  General transfer comment: refused- kept crying out "don't hurt me and I won't hurt you" every time PT touched her, even lightly  Ambulation/Gait             General Gait Details: unsafe/unable; non-ambulatory at baseline  Stairs            Wheelchair Mobility    Modified Rankin (Stroke Patients Only)       Balance                                             Pertinent Vitals/Pain Pain Assessment: Faces Faces Pain Scale: Hurts little more Pain Location: any extremity PT touched during mobility attempts Pain Descriptors / Indicators: Grimacing;Guarding;Moaning;Crying Pain Intervention(s): Limited activity within patient's tolerance;Monitored during session    Home Living Family/patient expects to be discharged to:: Private residence Living Arrangements: Children Available Help at Discharge: Available PRN/intermittently;Family Type of Home: House Home Access: Ramped entrance     Home Layout: One level Home Equipment: Shower seat;Wheelchair - manual Additional Comments: Patient limited historian; information obtained from the chart    Prior Function Level of Independence: Needs assistance   Gait / Transfers Assistance Needed: primary means of mobility in wheelchair  ADL's / Homemaking Assistance Needed: required assistance for ADLs  Comments: Patient limited historian; information obtained from chart.  WC level as primary mobility, assist for ADLs and transfers between seating surfaces.  Lives with son and receives  support services from PACE program.     Hand Dominance        Extremity/Trunk Assessment   Upper Extremity Assessment LUE Deficits / Details: L side hemiplegia with ROM deficits noted, moderate edema in LUE and LLE    Lower Extremity Assessment Lower Extremity Assessment: RLE deficits/detail;LLE deficits/detail RLE Deficits / Details: grossly WFL LLE Deficits / Details: L LE grossly hemiplegic, limited L knee flexion  noted       Communication   Communication: Other (comment) (pt oriented to self only, able to follow one-step commands but fairly irritable/disoriented during session)  Cognition Arousal/Alertness: Awake/alert Behavior During Therapy: Flat affect Overall Cognitive Status: No family/caregiver present to determine baseline cognitive functioning                                 General Comments: oriented to self only- [place] me [place] and that she had her stroke in 1958. Able to guess that she is in a hospital in [place] with max cues and prompting. Does not know why she is here. Reoriented as able.      General Comments General comments (skin integrity, edema, etc.): refused EOB today- per past charting, balance generally fair to poor    Exercises     Assessment/Plan    PT Assessment Patient needs continued PT services  PT Problem List Decreased strength;Decreased range of motion;Decreased activity tolerance;Decreased balance;Decreased mobility;Decreased coordination;Decreased cognition;Decreased knowledge of use of DME;Decreased safety awareness;Decreased knowledge of precautions       PT Treatment Interventions DME instruction;Gait training;Functional mobility training;Therapeutic activities;Therapeutic exercise;Balance training;Patient/family education    PT Goals (Current goals can be found in the Care Plan section)  Acute Rehab PT Goals PT Goal Formulation: Patient unable to participate in goal setting Time For Goal Achievement: 05/15/21 Potential to Achieve Goals: Fair    Frequency Min 2X/week   Barriers to discharge        Co-evaluation               AM-PAC PT "6 Clicks" Mobility  Outcome Measure Help needed turning from your back to your side while in a flat bed without using bedrails?: Total Help needed moving from lying on your back to sitting on the side of a flat bed without using bedrails?: Total Help needed moving to  and from a bed to a chair (including a wheelchair)?: Total Help needed standing up from a chair using your arms (e.g., wheelchair or bedside chair)?: Total Help needed to walk in hospital room?: Total Help needed climbing 3-5 steps with a railing? : Total 6 Click Score: 6    End of Session   Activity Tolerance: Patient limited by pain;Other (comment) (and cognition) Patient left: in bed;with call bell/phone within reach;with bed alarm set Nurse Communication: Mobility status;Other (comment) (BP and hemiplegic L UE) PT Visit Diagnosis: Muscle weakness (generalized) (M62.81);Difficulty in walking, not elsewhere classified (R26.2);Hemiplegia and hemiparesis;Other abnormalities of gait and mobility (R26.89) Hemiplegia - Right/Left: Left Hemiplegia - dominant/non-dominant: Non-dominant Hemiplegia - caused by: Cerebral infarction    Time: 7124-5809 PT Time Calculation (min) (ACUTE ONLY): 10 min   Charges:   PT Evaluation $PT Eval Moderate Complexity: 1 Mod          Windell Norfolk, DPT, PN1   Supplemental Physical Therapist [place]    Pager [phone] Acute Rehab Office [phone]

## 2021-05-01 NOTE — Progress Notes (Signed)
PROGRESS NOTE    Kirsten Mcmahon  WPY:[id number] DOB: 18-Sep-1946 DOA: 04/28/2021 PCP: [place]   Brief Narrative: Kirsten Mcmahon is a 75 y.o. female with a history of streptococcal bacteremia currently on vancomycin IV,ESRD, GERD, hypertension, hypothyroidism, non-insulin-dependent diabetes type 2, hyperlipidemia, CVA history. Patient presented secondary to confusion with concern for uremia secondary to unintentionally missed HD from significant amount of pain at home. She was admitted and started on HD.   Assessment & Plan:   Principal Problem:   ESRD (end stage renal disease) ([place]) Active Problems:   Hypertension   Hypothyroidism   Type II diabetes mellitus with renal manifestations (HCC)   Streptococcal bacteremia   Pressure injury of skin   Physical deconditioning   Generalized muscle weakness   ESRD on HD Newly started on hemodialysis earlier this month. Nephrology consulted and are continuing HD while inpatient on a MWF schedule which has been complicated by mental status.  Confusion Unsure of etiology. On admission, however, concern was uremic encephalopathy. Difficult to know what her baseline is. Per nephrology, mental status has not really improved since admission. I tried contacting son, PACE, nephrology, previous hospitalist, and reviewing prior notes, but could not get an answer on recent baseline. No imaging performed, however she does not have any new focal deficits concerning for acute intracranial pathology. No fevers or leukocytosis to suggest possible persistent bacteremia and most recent blood cultures were clear from previous hospitalization. It is possible patient has underlying cognitive impairment as this was suggested as a possibility from prior admission and may be at her baseline.  Streptococcus group G bacteremia This was diagnosed during previous hospitalization. At that time, Transesophageal Echocardiogram was recommended but  not performed secondary to patient/family decline. Patient was discharged with recommendations for ampicillin IV until 06/01/2021; however, this appears to have changed to Vancomycin IV. On this admission, she was continued on Vancomycin IV.  Anemia of chronic kidney disease Stable. No evidence of hemorrhage.  Primary hypertension Patient is on metoprolol and clonidine as an outpatient -Continue Toprol XL 50 mg daily and clonidine patch 0.2 mg q week  Hypothyroidism -Continue Synthroid 112 mcg daily  Diabetes mellitus, type 2 Well controlled. Patient is on Januvia as an outpatient. Last hemoglobin A1C of 5.7. Started on SSI and and Tradjenta inpatient -Continue SSI -Continue Tradjenta  History of seizures -Continue Keppra 500 mg daily and Keppra 250 mg three times weekly  Pressure injury Mid buttocks, present on admission.   DVT prophylaxis: Heparin subq Code Status:   Code Status: Full Code Family Communication: None at bedside. Attempted to call son x2 with no answer Disposition Plan: Discharge home with PACE services likely in 2-4 days pending ability to dialyze and, if not at baseline, improvement of mental status   Consultants:   Nephrology  Procedures:   HEMODIALYSIS  Antimicrobials:  Vancomycin IV    Subjective: Patient reports no concerns today.  Objective: Vitals:   04/30/21 2032 05/01/21 0437 05/01/21 0905 05/01/21 1510  BP: 96/61 (!) 89/64 (!) 90/48 94/81  Pulse: 95 74 88 87  Resp: 19 14  20   Temp: 99.6 F (37.6 C) 99.2 F (37.3 C)  98.3 F (36.8 C)  TempSrc: Axillary Axillary  Oral  SpO2: 98% 100%  100%  Weight:      Height:        Intake/Output Summary (Last 24 hours) at 05/01/2021 1716 Last data filed at 05/01/2021 1016 Gross per 24 hour  Intake 120 ml  Output --  Net 120 ml   Filed Weights   04/29/21 1800 04/30/21 0855 04/30/21 1205  Weight: 56.9 kg 56.1 kg 55 kg    Examination:  General exam: Appears calm and  comfortable Respiratory system: Clear to auscultation. Respiratory effort normal. Cardiovascular system: S1 & S2 heard. Normal rate and regular rhythm. Gastrointestinal system: Abdomen is nondistended, soft and nontender. No organomegaly or masses felt. Normal bowel sounds heard. Central nervous system: Alert and oriented to person. Thought the year was 2021. Not oriented to place or situation. Musculoskeletal: No edema. No calf tenderness Skin: No cyanosis. No rashes Psychiatry: Judgement and insight appear impaired.    Data Reviewed: I have personally reviewed following labs and imaging studies  CBC Lab Results  Component Value Date   WBC 7.6 05/01/2021   RBC 2.99 (L) 05/01/2021   HGB 8.8 (L) 05/01/2021   HCT 26.9 (L) 05/01/2021   MCV 90.0 05/01/2021   MCH 29.4 05/01/2021   PLT 269 05/01/2021   MCHC 32.7 05/01/2021   RDW 14.3 05/01/2021   LYMPHSABS 1.3 05/01/2021   MONOABS 0.9 05/01/2021   EOSABS 0.2 05/01/2021   BASOSABS 0.1 38/18/2993     Last metabolic panel Lab Results  Component Value Date   NA 134 (L) 05/01/2021   K 4.0 05/01/2021   CL 98 05/01/2021   CO2 27 05/01/2021   BUN 18 05/01/2021   CREATININE 3.54 (H) 05/01/2021   GLUCOSE 96 05/01/2021   GFRNONAA 13 (L) 05/01/2021   GFRAA 34 (L) 08/01/2018   CALCIUM 8.0 (L) 05/01/2021   PHOS 4.0 04/30/2021   PROT 6.8 04/29/2021   ALBUMIN 2.3 (L) 04/29/2021   BILITOT 0.7 04/29/2021   ALKPHOS 58 04/29/2021   AST 15 04/29/2021   ALT 14 04/29/2021   ANIONGAP 9 05/01/2021    CBG (last 3)  Recent Labs    05/01/21 0717 05/01/21 1145 05/01/21 1629  GLUCAP 92 136* 115*     GFR: Estimated Creatinine Clearance: 10.7 mL/min (A) (by C-G formula based on SCr of 3.54 mg/dL (H)).  Coagulation Profile: Recent Labs  Lab 04/30/21 1533  INR 1.0    Recent Results (from the past 240 hour(s))  Culture, blood (single) w Reflex to ID Panel     Status: None   Collection Time: 04/23/21 12:53 PM   Specimen: BLOOD   Result Value Ref Range Status   Specimen Description BLOOD BLOOD LEFT HAND  Final   Special Requests   Final    BOTTLES DRAWN AEROBIC AND ANAEROBIC Blood Culture adequate volume   Culture   Final    NO GROWTH 5 DAYS Performed at [facility], [address] [place], [place] [address]    Report Status 04/28/2021 FINAL  Final  Resp Panel by RT-PCR (Flu A&B, Covid) Nasopharyngeal Swab     Status: None   Collection Time: 04/28/21  5:47 PM   Specimen: Nasopharyngeal Swab; Nasopharyngeal(NP) swabs in vial transport medium  Result Value Ref Range Status   SARS Coronavirus 2 by RT PCR NEGATIVE NEGATIVE Final    Comment: (NOTE) SARS-CoV-2 target nucleic acids are NOT DETECTED.  The SARS-CoV-2 RNA is generally detectable in upper respiratory specimens during the acute phase of infection. The lowest concentration of SARS-CoV-2 viral copies this assay can detect is 138 copies/mL. A negative result does not preclude SARS-Cov-2 infection and should not be used as the sole basis for treatment or other patient management decisions. A negative result may occur with  improper specimen collection/handling,  submission of specimen other than nasopharyngeal swab, presence of viral mutation(s) within the areas targeted by this assay, and inadequate number of viral copies(<138 copies/mL). A negative result must be combined with clinical observations, patient history, and epidemiological information. The expected result is Negative.  Fact Sheet for Patients:  EntrepreneurPulse.com.au  Fact Sheet for Healthcare Providers:  IncredibleEmployment.be  This test is no t yet approved or cleared by the [place] FDA and  has been authorized for detection and/or diagnosis of SARS-CoV-2 by FDA under an Emergency Use Authorization (EUA). This EUA will remain  in effect (meaning this test can be used) for the duration of the COVID-19 declaration under Section  564(b)(1) of the Act, 21 U.S.C.section 360bbb-3(b)(1), unless the authorization is terminated  or revoked sooner.       Influenza A by PCR NEGATIVE NEGATIVE Final   Influenza B by PCR NEGATIVE NEGATIVE Final    Comment: (NOTE) The Xpert Xpress SARS-CoV-2/FLU/RSV plus assay is intended as an aid in the diagnosis of influenza from Nasopharyngeal swab specimens and should not be used as a sole basis for treatment. Nasal washings and aspirates are unacceptable for Xpert Xpress SARS-CoV-2/FLU/RSV testing.  Fact Sheet for Patients: EntrepreneurPulse.com.au  Fact Sheet for Healthcare Providers: IncredibleEmployment.be  This test is not yet approved or cleared by the [place] FDA and has been authorized for detection and/or diagnosis of SARS-CoV-2 by FDA under an Emergency Use Authorization (EUA). This EUA will remain in effect (meaning this test can be used) for the duration of the COVID-19 declaration under Section 564(b)(1) of the Act, 21 U.S.C. section 360bbb-3(b)(1), unless the authorization is terminated or revoked.  Performed at [facility], [place] [address], [place], [place] [address]         Radiology Studies: IR Fluoro Guide CV Line Right  Result Date: 04/30/2021 INDICATION: Poorly functioning tunneled dialysis catheter EXAM: Tunneled dialysis catheter exchange MEDICATIONS: None ANESTHESIA/SEDATION: None FLUOROSCOPY TIME:  Fluoroscopy Time: 0 minutes 30 seconds (1 mGy). COMPLICATIONS: None immediate. PROCEDURE: Informed written consent was obtained from the patient's son after a thorough discussion of the procedural risks, benefits and alternatives. All questions were addressed. Maximal Sterile Barrier Technique was utilized including caps, mask, sterile gowns, sterile gloves, sterile drape, hand hygiene and skin antiseptic. A timeout was performed prior to the initiation of the procedure. Patient positioned supine on the  procedure table. The external segment of the existing tunneled IJ catheter and surrounding skin prepped and draped usual fashion. Following local lidocaine administration, the existing catheter was removed over a 0.035 inch guidewire under fluoroscopy. New 23 cm tunneled dialysis catheter was inserted over the guidewire. Tip positioned in the right atrium. Both lumens aspirated and flushed well and were locked with heparin. Catheter secured to skin with suture and insertion site covered with Biopatch and sterile dressing. IMPRESSION: Successful exchange of poorly functioning right IJ dialysis catheter. New 23 cm tunneled hemodialysis catheter in place and ready for use. Electronically Signed   By: Miachel Roux M.D.   On: 04/30/2021 15:59        Scheduled Meds: . (feeding supplement) PROSource Plus  30 mL Oral TID BM  . Chlorhexidine Gluconate Cloth  6 each Topical Q0600  . cloNIDine  0.2 mg Transdermal Weekly  . [START ON 05/02/2021] feeding supplement (NEPRO CARB STEADY)  237 mL Oral BID BM  . Gerhardt's butt cream   Topical BID  . heparin  5,000 Units Subcutaneous Q8H  . insulin aspart  0-5 Units Subcutaneous  QHS  . insulin aspart  0-6 Units Subcutaneous TID WC  . latanoprost  1 drop Both Eyes QHS  . letrozole  2.5 mg Oral Daily  . levETIRAcetam  500 mg Oral Q1500   And  . levETIRAcetam  250 mg Oral Once per day on Mon Wed Fri  . [START ON 05/02/2021] levETIRAcetam  250 mg Oral Once  . levothyroxine  112 mcg Oral QAC breakfast  . linagliptin  5 mg Oral Daily  . metoprolol succinate  50 mg Oral Daily  . multivitamin  1 tablet Oral QHS  . vancomycin  500 mg Intravenous Q T,Th,Sat-1800  . [START ON 05/06/2021] vancomycin  500 mg Intravenous Q M,W,F-HD   Continuous Infusions:   LOS: 3 days     Cordelia Poche, MD [facility] 05/01/2021, 5:16 PM  If 7PM-7AM, please contact night-coverage [email or web address]

## 2021-05-02 LAB — BASIC METABOLIC PANEL
Anion gap: 11 (ref 5–15)
BUN: 29 mg/dL — ABNORMAL HIGH (ref 8–23)
CO2: 26 mmol/L (ref 22–32)
Calcium: 8.6 mg/dL — ABNORMAL LOW (ref 8.9–10.3)
Chloride: 97 mmol/L — ABNORMAL LOW (ref 98–111)
Creatinine, Ser: 5.48 mg/dL — ABNORMAL HIGH (ref 0.44–1.00)
GFR, Estimated: 8 mL/min — ABNORMAL LOW (ref >60)
Glucose, Bld: 90 mg/dL (ref 70–99)
Potassium: 4 mmol/L (ref 3.5–5.1)
Sodium: 134 mmol/L — ABNORMAL LOW (ref 135–145)

## 2021-05-02 LAB — CBC WITH DIFFERENTIAL/PLATELET
Abs Immature Granulocytes: 0.03 10*3/uL (ref 0.00–0.07)
Basophils Absolute: 0.1 10*3/uL (ref 0.0–0.1)
Basophils Relative: 1 %
Eosinophils Absolute: 0.2 10*3/uL (ref 0.0–0.5)
Eosinophils Relative: 3 %
HCT: 27.6 % — ABNORMAL LOW (ref 36.0–46.0)
Hemoglobin: 8.8 g/dL — ABNORMAL LOW (ref 12.0–15.0)
Immature Granulocytes: 0 %
Lymphocytes Relative: 19 %
Lymphs Abs: 1.4 10*3/uL (ref 0.7–4.0)
MCH: 28.9 pg (ref 26.0–34.0)
MCHC: 31.9 g/dL (ref 30.0–36.0)
MCV: 90.5 fL (ref 80.0–100.0)
Monocytes Absolute: 0.8 10*3/uL (ref 0.1–1.0)
Monocytes Relative: 10 %
Neutro Abs: 4.9 10*3/uL (ref 1.7–7.7)
Neutrophils Relative %: 67 %
Platelets: 288 10*3/uL (ref 150–400)
RBC: 3.05 MIL/uL — ABNORMAL LOW (ref 3.87–5.11)
RDW: 14.3 % (ref 11.5–15.5)
WBC: 7.4 10*3/uL (ref 4.0–10.5)
nRBC: 0 % (ref 0.0–0.2)

## 2021-05-02 LAB — GLUCOSE, CAPILLARY
Glucose-Capillary: 69 mg/dL — ABNORMAL LOW (ref 70–99)
Glucose-Capillary: 132 mg/dL — ABNORMAL HIGH (ref 70–99)
Glucose-Capillary: 89 mg/dL (ref 70–99)
Glucose-Capillary: 122 mg/dL — ABNORMAL HIGH (ref 70–99)
Glucose-Capillary: 104 mg/dL — ABNORMAL HIGH (ref 70–99)

## 2021-05-02 LAB — MAGNESIUM: Magnesium: 2.2 mg/dL (ref 1.7–2.4)

## 2021-05-02 NOTE — Progress Notes (Signed)
Kirsten Mcmahon   75 y.o. female DMT2, HTN, GERD, hypothyroidism, HLD, Hx CVA, seizure disorder and ESRD new start on HD as of 04/10/2021 at [facility].   (MWF) at [facility] but refused to be dialyzed at [facility]. Poor historian, recent admit to [facility] from 5/5-5/20/2022 due to new ESRD requiring dialysis, anemia of CKD, streptococcal bacteremia and AMS/delirium.  Patient son refused TEE and patient discharged on IV Vancomycin with HD until 06/01/21.     Assessment/ Plan:   1. AMS - likely multifactorial. Underlying dementia, recent bacteremia and missed dialysis.  Baseline unclear. Primary considering involving psych for capacity and palliative care for goals of care.  2.  ESRD -  Recent new start with MWF schedule but  had not started at outpatient [facility].   Tolerated HD 5/25 with net 1.5L UF and 1L 5/26.  On for HD today  and then next week back to  regular schedule MWF.  She's more alert and awake  this AM and  Cooperative. I wonder if she just has these intervals of lucidity. Very confused yest AM.  Will try HD in a chair today but I have my doubts whether she will tolerate. Hopefully the nausea is from uremia and iHD today will help.  3.  Hypertension/volume  - BP in goal.  Continue home meds and hold prior to dialysis. Does not appear grossly volume overloaded.  UF as tolerated.   4. Streptococcal bacteremia - on Vanc until 6/27. Per primary Anemia of CKD - Hgb 10.2 Not on ESA. Follow trends. 5.  Secondary Hyperparathyroidism -  CCa at goal. Phos 4 5/26.  Not on VDRA or binders.  6.  Nutrition - Renal diet w/fluid restrictions 7. Seizure disorder - on Keppra 8. DMT2 - per primary   Subjective:   C/O nausea this am but denies abd pain or diarrhea or dyspnea.     Objective:   BP 110/75 (BP Location: Left Arm)   Pulse 79   Temp 98.1 F (36.7 C) (Axillary)   Resp 18   Ht 5\' 2"  (1.575 m)   Wt 55 kg   SpO2 99%   BMI 22.18 kg/m   Intake/Output Summary (Last 24  hours) at 05/02/2021 0623 Last data filed at 05/01/2021 1016 Gross per 24 hour  Intake 120 ml  Output --  Net 120 ml   Weight change:   Physical Exam: General: NAD NCAT Alert and oriented to person, place and ~time (thought it was 2021) Lungs: CTA bilaterally.  Heart: RRR. No murmur, rubs or gallops.  Abdomen: soft, nontender, +BS Lower extremities:trace edema.  Psych:  Responds to questions  this am Dialysis Access: RIJ [facility]  Imaging: IR Fluoro Guide CV Line Right  Result Date: 04/30/2021 INDICATION: Poorly functioning tunneled dialysis catheter EXAM: Tunneled dialysis catheter exchange MEDICATIONS: None ANESTHESIA/SEDATION: None FLUOROSCOPY TIME:  Fluoroscopy Time: 0 minutes 30 seconds (1 mGy). COMPLICATIONS: None immediate. PROCEDURE: Informed written consent was obtained from the patient's son after a thorough discussion of the procedural risks, benefits and alternatives. All questions were addressed. Maximal Sterile Barrier Technique was utilized including caps, mask, sterile gowns, sterile gloves, sterile drape, hand hygiene and skin antiseptic. A timeout was performed prior to the initiation of the procedure. Patient positioned supine on the procedure table. The external segment of the existing tunneled IJ catheter and surrounding skin prepped and draped usual fashion. Following local lidocaine administration, the existing catheter was removed over a 0.035 inch guidewire under fluoroscopy. New 23  cm tunneled dialysis catheter was inserted over the guidewire. Tip positioned in the right atrium. Both lumens aspirated and flushed well and were locked with heparin. Catheter secured to skin with suture and insertion site covered with Biopatch and sterile dressing. IMPRESSION: Successful exchange of poorly functioning right IJ dialysis catheter. New 23 cm tunneled hemodialysis catheter in place and ready for use. Electronically Signed   By: Miachel Roux M.D.   On: 04/30/2021 15:59     Labs: BMET Recent Labs  Lab 04/28/21 1513 04/29/21 0316 04/30/21 0132 05/01/21 0058 05/02/21 0034  NA 136 136 136 134* 134*  K 4.0 4.1 3.7 4.0 4.0  CL 96* 98 97* 98 97*  CO2 23 23 24 27 26   GLUCOSE 124* 107* 88 96 90  BUN 62* 66* 18 18 29*  CREATININE 8.71* 9.13* 3.78* 3.54* 5.48*  CALCIUM 8.6* 8.6* 7.7* 8.0* 8.6*  PHOS  --   --  4.0  --   --    CBC Recent Labs  Lab 04/28/21 1513 04/29/21 0139 04/30/21 0132 05/01/21 0058 05/02/21 0034  WBC 14.4* 11.7* 11.6* 7.6 7.4  NEUTROABS 12.4*  --  9.6* 5.1 4.9  HGB 10.2* 10.2* 9.4* 8.8* 8.8*  HCT 32.2* 31.6* 29.5* 26.9* 27.6*  MCV 92.5 91.3 90.5 90.0 90.5  PLT 488* 473* 396 269 288    Medications:    . (feeding supplement) PROSource Plus  30 mL Oral TID BM  . Chlorhexidine Gluconate Cloth  6 each Topical Q0600  . cloNIDine  0.2 mg Transdermal Weekly  . feeding supplement (NEPRO CARB STEADY)  237 mL Oral BID BM  . Gerhardt's butt cream   Topical BID  . heparin  5,000 Units Subcutaneous Q8H  . insulin aspart  0-5 Units Subcutaneous QHS  . insulin aspart  0-6 Units Subcutaneous TID WC  . latanoprost  1 drop Both Eyes QHS  . letrozole  2.5 mg Oral Daily  . levETIRAcetam  500 mg Oral Q1500   And  . levETIRAcetam  250 mg Oral Once per day on Mon Wed Fri  . levETIRAcetam  250 mg Oral Once  . levothyroxine  112 mcg Oral QAC breakfast  . linagliptin  5 mg Oral Daily  . metoprolol succinate  50 mg Oral Daily  . multivitamin  1 tablet Oral QHS  . vancomycin  500 mg Intravenous Q T,Th,Sat-1800  . [START ON 05/06/2021] vancomycin  500 mg Intravenous Q M,W,F-HD      Otelia Santee, MD 05/02/2021, 7:06 AM

## 2021-05-02 NOTE — Progress Notes (Signed)
PROGRESS NOTE    Kirsten Mcmahon  KNL:[id number] DOB: 09/13/1946 DOA: 04/28/2021 PCP: [place]   Brief Narrative: Kirsten Mcmahon is a 75 y.o. female with a history of streptococcal bacteremia currently on vancomycin IV,ESRD, GERD, hypertension, hypothyroidism, non-insulin-dependent diabetes type 2, hyperlipidemia, CVA history. Patient presented secondary to confusion with concern for uremia secondary to unintentionally missed HD from significant amount of pain at home. She was admitted and started on HD.   Assessment & Plan:   Principal Problem:   ESRD (end stage renal disease) ([place]) Active Problems:   Hypertension   Hypothyroidism   Type II diabetes mellitus with renal manifestations (HCC)   Streptococcal bacteremia   Pressure injury of skin   Physical deconditioning   Generalized muscle weakness   ESRD on HD Newly started on hemodialysis earlier this month. Nephrology consulted and are continuing HD while inpatient on a MWF schedule which has been complicated by mental status. HD today. -Nephrology recommendations for HD  Confusion Unsure of etiology. On admission, however, concern was uremic encephalopathy. Difficult to know what her baseline is. Per nephrology, mental status has not really improved since admission. I tried contacting son, PACE, nephrology, previous hospitalist, and reviewing prior notes, but could not get an answer on recent baseline. No imaging performed, however she does not have any new focal deficits concerning for acute intracranial pathology. No fevers or leukocytosis to suggest possible persistent bacteremia and most recent blood cultures were clear from previous hospitalization. It is possible patient has underlying cognitive impairment as this was suggested as a possibility from prior admission and may be at her baseline. Possibly stable at this time.  Streptococcus group G bacteremia This was diagnosed during previous  hospitalization. At that time, Transesophageal Echocardiogram was recommended but not performed secondary to patient/family decline. Patient was discharged with recommendations for ampicillin IV until 06/01/2021; however, this appears to have changed to Vancomycin IV. On this admission, she was continued on Vancomycin IV.  Anemia of chronic kidney disease Stable. No evidence of hemorrhage.  Primary hypertension Patient is on metoprolol and clonidine as an outpatient -Continue Toprol XL 50 mg daily and clonidine patch 0.2 mg q week  Hypothyroidism -Continue Synthroid 112 mcg daily  Diabetes mellitus, type 2 Well controlled. Patient is on Januvia as an outpatient. Last hemoglobin A1C of 5.7. Started on SSI and and Tradjenta inpatient. Hypoglycemic episodes. -Continue SSI -Will hold Tradjenta   History of seizures -Continue Keppra 500 mg daily and Keppra 250 mg three times weekly  Pressure injury Mid buttocks, present on admission.   DVT prophylaxis: Heparin subq Code Status:   Code Status: Full Code Family Communication: Son on telephone (15 minutes) Disposition Plan: Discharge home with PACE services likely in 1-3 days pending ability to dialyze and, if not at baseline, improvement of mental status   Consultants:   Nephrology  Procedures:   HEMODIALYSIS  Antimicrobials:  Vancomycin IV    Subjective: Hungry. No other issues today.  Objective: Vitals:   05/02/21 0900 05/02/21 0905 05/02/21 0930 05/02/21 1000  BP: 100/71 (!) 87/38 102/69 (!) 68/42  Pulse: (!) 113 (!) 111 (!) 114   Resp:      Temp:      TempSrc:      SpO2:      Weight:      Height:       No intake or output data in the 24 hours ending 05/02/21 1016 Filed Weights   04/30/21 0855 04/30/21 1205 05/02/21  0755  Weight: 56.1 kg 55 kg 55.6 kg    Examination:  General exam: Appears calm and comfortable Respiratory system: Clear to auscultation. Respiratory effort normal. Cardiovascular system: S1  & S2 heard, RRR. No murmurs, rubs, gallops or clicks. Gastrointestinal system: Abdomen is nondistended, soft and nontender. No organomegaly or masses felt. Normal bowel sounds heard. Central nervous system: Alert and oriented to person, place and year. No focal neurological deficits. Musculoskeletal: No edema. No calf tenderness Skin: No cyanosis. No rashes Psychiatry: Judgement and insight appear normal. Mood & affect appropriate.     Data Reviewed: I have personally reviewed following labs and imaging studies  CBC Lab Results  Component Value Date   WBC 7.4 05/02/2021   RBC 3.05 (L) 05/02/2021   HGB 8.8 (L) 05/02/2021   HCT 27.6 (L) 05/02/2021   MCV 90.5 05/02/2021   MCH 28.9 05/02/2021   PLT 288 05/02/2021   MCHC 31.9 05/02/2021   RDW 14.3 05/02/2021   LYMPHSABS 1.4 05/02/2021   MONOABS 0.8 05/02/2021   EOSABS 0.2 05/02/2021   BASOSABS 0.1 29/52/8413     Last metabolic panel Lab Results  Component Value Date   NA 134 (L) 05/02/2021   K 4.0 05/02/2021   CL 97 (L) 05/02/2021   CO2 26 05/02/2021   BUN 29 (H) 05/02/2021   CREATININE 5.48 (H) 05/02/2021   GLUCOSE 90 05/02/2021   GFRNONAA 8 (L) 05/02/2021   GFRAA 34 (L) 08/01/2018   CALCIUM 8.6 (L) 05/02/2021   PHOS 4.0 04/30/2021   PROT 6.8 04/29/2021   ALBUMIN 2.3 (L) 04/29/2021   BILITOT 0.7 04/29/2021   ALKPHOS 58 04/29/2021   AST 15 04/29/2021   ALT 14 04/29/2021   ANIONGAP 11 05/02/2021    CBG (last 3)  Recent Labs    05/01/21 2107 05/02/21 0729 05/02/21 0831  GLUCAP 73 69* 132*     GFR: Estimated Creatinine Clearance: 6.9 mL/min (A) (by C-G formula based on SCr of 5.48 mg/dL (H)).  Coagulation Profile: Recent Labs  Lab 04/30/21 1533  INR 1.0    Recent Results (from the past 240 hour(s))  Culture, blood (single) w Reflex to ID Panel     Status: None   Collection Time: 04/23/21 12:53 PM   Specimen: BLOOD  Result Value Ref Range Status   Specimen Description BLOOD BLOOD LEFT HAND  Final    Special Requests   Final    BOTTLES DRAWN AEROBIC AND ANAEROBIC Blood Culture adequate volume   Culture   Final    NO GROWTH 5 DAYS Performed at [facility], [address] [place], [place] [address]    Report Status 04/28/2021 FINAL  Final  Resp Panel by RT-PCR (Flu A&B, Covid) Nasopharyngeal Swab     Status: None   Collection Time: 04/28/21  5:47 PM   Specimen: Nasopharyngeal Swab; Nasopharyngeal(NP) swabs in vial transport medium  Result Value Ref Range Status   SARS Coronavirus 2 by RT PCR NEGATIVE NEGATIVE Final    Comment: (NOTE) SARS-CoV-2 target nucleic acids are NOT DETECTED.  The SARS-CoV-2 RNA is generally detectable in upper respiratory specimens during the acute phase of infection. The lowest concentration of SARS-CoV-2 viral copies this assay can detect is 138 copies/mL. A negative result does not preclude SARS-Cov-2 infection and should not be used as the sole basis for treatment or other patient management decisions. A negative result may occur with  improper specimen collection/handling, submission of specimen other than nasopharyngeal swab, presence of viral mutation(s) within  the areas targeted by this assay, and inadequate number of viral copies(<138 copies/mL). A negative result must be combined with clinical observations, patient history, and epidemiological information. The expected result is Negative.  Fact Sheet for Patients:  EntrepreneurPulse.com.au  Fact Sheet for Healthcare Providers:  IncredibleEmployment.be  This test is no t yet approved or cleared by the [place] FDA and  has been authorized for detection and/or diagnosis of SARS-CoV-2 by FDA under an Emergency Use Authorization (EUA). This EUA will remain  in effect (meaning this test can be used) for the duration of the COVID-19 declaration under Section 564(b)(1) of the Act, 21 U.S.C.section 360bbb-3(b)(1), unless the authorization is  terminated  or revoked sooner.       Influenza A by PCR NEGATIVE NEGATIVE Final   Influenza B by PCR NEGATIVE NEGATIVE Final    Comment: (NOTE) The Xpert Xpress SARS-CoV-2/FLU/RSV plus assay is intended as an aid in the diagnosis of influenza from Nasopharyngeal swab specimens and should not be used as a sole basis for treatment. Nasal washings and aspirates are unacceptable for Xpert Xpress SARS-CoV-2/FLU/RSV testing.  Fact Sheet for Patients: EntrepreneurPulse.com.au  Fact Sheet for Healthcare Providers: IncredibleEmployment.be  This test is not yet approved or cleared by the [place] FDA and has been authorized for detection and/or diagnosis of SARS-CoV-2 by FDA under an Emergency Use Authorization (EUA). This EUA will remain in effect (meaning this test can be used) for the duration of the COVID-19 declaration under Section 564(b)(1) of the Act, 21 U.S.C. section 360bbb-3(b)(1), unless the authorization is terminated or revoked.  Performed at [facility], [place] [address] [place], [place] [address]         Radiology Studies: IR Fluoro Guide CV Line Right  Result Date: 04/30/2021 INDICATION: Poorly functioning tunneled dialysis catheter EXAM: Tunneled dialysis catheter exchange MEDICATIONS: None ANESTHESIA/SEDATION: None FLUOROSCOPY TIME:  Fluoroscopy Time: 0 minutes 30 seconds (1 mGy). COMPLICATIONS: None immediate. PROCEDURE: Informed written consent was obtained from the patient's son after a thorough discussion of the procedural risks, benefits and alternatives. All questions were addressed. Maximal Sterile Barrier Technique was utilized including caps, mask, sterile gowns, sterile gloves, sterile drape, hand hygiene and skin antiseptic. A timeout was performed prior to the initiation of the procedure. Patient positioned supine on the procedure table. The external segment of the existing tunneled IJ catheter and surrounding  skin prepped and draped usual fashion. Following local lidocaine administration, the existing catheter was removed over a 0.035 inch guidewire under fluoroscopy. New 23 cm tunneled dialysis catheter was inserted over the guidewire. Tip positioned in the right atrium. Both lumens aspirated and flushed well and were locked with heparin. Catheter secured to skin with suture and insertion site covered with Biopatch and sterile dressing. IMPRESSION: Successful exchange of poorly functioning right IJ dialysis catheter. New 23 cm tunneled hemodialysis catheter in place and ready for use. Electronically Signed   By: Miachel Roux M.D.   On: 04/30/2021 15:59        Scheduled Meds: . (feeding supplement) PROSource Plus  30 mL Oral TID BM  . Chlorhexidine Gluconate Cloth  6 each Topical Q0600  . cloNIDine  0.2 mg Transdermal Weekly  . feeding supplement (NEPRO CARB STEADY)  237 mL Oral BID BM  . Gerhardt's butt cream   Topical BID  . heparin  5,000 Units Subcutaneous Q8H  . insulin aspart  0-5 Units Subcutaneous QHS  . insulin aspart  0-6 Units Subcutaneous TID WC  . latanoprost  1 drop Both Eyes QHS  . letrozole  2.5 mg Oral Daily  . levETIRAcetam  500 mg Oral Q1500   And  . levETIRAcetam  250 mg Oral Once per day on Mon Wed Fri  . levETIRAcetam  250 mg Oral Once  . levothyroxine  112 mcg Oral QAC breakfast  . linagliptin  5 mg Oral Daily  . metoprolol succinate  50 mg Oral Daily  . multivitamin  1 tablet Oral QHS  . vancomycin  500 mg Intravenous Q T,Th,Sat-1800  . [START ON 05/06/2021] vancomycin  500 mg Intravenous Q M,W,F-HD   Continuous Infusions:   LOS: 4 days     Cordelia Poche, MD [facility] 05/02/2021, 10:16 AM  If 7PM-7AM, please contact night-coverage [email or web address]

## 2021-05-03 LAB — BASIC METABOLIC PANEL
Anion gap: 10 (ref 5–15)
BUN: 9 mg/dL (ref 8–23)
CO2: 28 mmol/L (ref 22–32)
Calcium: 8.2 mg/dL — ABNORMAL LOW (ref 8.9–10.3)
Chloride: 95 mmol/L — ABNORMAL LOW (ref 98–111)
Creatinine, Ser: 2.86 mg/dL — ABNORMAL HIGH (ref 0.44–1.00)
GFR, Estimated: 17 mL/min — ABNORMAL LOW (ref >60)
Glucose, Bld: 133 mg/dL — ABNORMAL HIGH (ref 70–99)
Potassium: 3.9 mmol/L (ref 3.5–5.1)
Sodium: 133 mmol/L — ABNORMAL LOW (ref 135–145)

## 2021-05-03 LAB — CBC WITH DIFFERENTIAL/PLATELET
Abs Immature Granulocytes: 0.04 10*3/uL (ref 0.00–0.07)
Basophils Absolute: 0.1 10*3/uL (ref 0.0–0.1)
Basophils Relative: 1 %
Eosinophils Absolute: 0.2 10*3/uL (ref 0.0–0.5)
Eosinophils Relative: 2 %
HCT: 29.4 % — ABNORMAL LOW (ref 36.0–46.0)
Hemoglobin: 9.5 g/dL — ABNORMAL LOW (ref 12.0–15.0)
Immature Granulocytes: 1 %
Lymphocytes Relative: 15 %
Lymphs Abs: 1.3 10*3/uL (ref 0.7–4.0)
MCH: 28.9 pg (ref 26.0–34.0)
MCHC: 32.3 g/dL (ref 30.0–36.0)
MCV: 89.4 fL (ref 80.0–100.0)
Monocytes Absolute: 0.7 10*3/uL (ref 0.1–1.0)
Monocytes Relative: 8 %
Neutro Abs: 6.4 10*3/uL (ref 1.7–7.7)
Neutrophils Relative %: 73 %
Platelets: 229 10*3/uL (ref 150–400)
RBC: 3.29 MIL/uL — ABNORMAL LOW (ref 3.87–5.11)
RDW: 14.1 % (ref 11.5–15.5)
WBC: 8.6 10*3/uL (ref 4.0–10.5)
nRBC: 0 % (ref 0.0–0.2)

## 2021-05-03 LAB — GLUCOSE, CAPILLARY
Glucose-Capillary: 94 mg/dL (ref 70–99)
Glucose-Capillary: 131 mg/dL — ABNORMAL HIGH (ref 70–99)
Glucose-Capillary: 96 mg/dL (ref 70–99)

## 2021-05-03 LAB — MAGNESIUM: Magnesium: 2 mg/dL (ref 1.7–2.4)

## 2021-05-03 NOTE — Progress Notes (Signed)
PROGRESS NOTE    Kirsten Mcmahon  FUX:[id number] DOB: March 22, 1946 DOA: 04/28/2021 PCP: [place]   Brief Narrative: Kirsten Mcmahon is a 75 y.o. female with a history of streptococcal bacteremia currently on vancomycin IV,ESRD, GERD, hypertension, hypothyroidism, non-insulin-dependent diabetes type 2, hyperlipidemia, CVA history. Patient presented secondary to confusion with concern for uremia secondary to unintentionally missed HD from significant amount of pain at home. She was admitted and started on HD.   Assessment & Plan:   Principal Problem:   ESRD (end stage renal disease) ([place]) Active Problems:   Hypertension   Hypothyroidism   Type II diabetes mellitus with renal manifestations (HCC)   Streptococcal bacteremia   Pressure injury of skin   Physical deconditioning   Generalized muscle weakness   ESRD on HD Newly started on hemodialysis earlier this month. Nephrology consulted and are continuing HD while inpatient on a MWF schedule which has been complicated by mental status. HD today. -Nephrology recommendations for HD  Confusion Unsure of etiology. On admission, however, concern was uremic encephalopathy. Difficult to know what her baseline is. Per nephrology, mental status has not really improved since admission. I tried contacting son, PACE, nephrology, previous hospitalist, and reviewing prior notes, but could not get an answer on recent baseline. No imaging performed, however she does not have any new focal deficits concerning for acute intracranial pathology. No fevers or leukocytosis to suggest possible persistent bacteremia and most recent blood cultures were clear from previous hospitalization. It is possible patient has underlying cognitive impairment as this was suggested as a possibility from prior admission and may be at her baseline. Possibly stable at this time.  Streptococcus group G bacteremia This was diagnosed during previous  hospitalization. At that time, Transesophageal Echocardiogram was recommended but not performed secondary to patient/family decline. Patient was discharged with recommendations for ampicillin IV until 06/01/2021; however, this appears to have changed to Vancomycin IV. On this admission, she was continued on Vancomycin IV.  Anemia of chronic kidney disease Stable. No evidence of hemorrhage.  Primary hypertension Patient is on metoprolol and clonidine as an outpatient -Continue Toprol XL 50 mg daily and clonidine patch 0.2 mg q week  Hypothyroidism -Continue Synthroid 112 mcg daily  Diabetes mellitus, type 2 Well controlled. Patient is on Januvia as an outpatient. Last hemoglobin A1C of 5.7. Started on SSI and and Tradjenta inpatient. Hypoglycemic episodes. -Continue SSI -Will hold Tradjenta   History of seizures -Continue Keppra 500 mg daily and Keppra 250 mg three times weekly  Pressure injury Mid buttocks, present on admission.   DVT prophylaxis: Heparin subq Code Status:   Code Status: Full Code Family Communication: None at bedside Disposition Plan: Discharge home with PACE services likely in 1-3 days pending ability to dialyze and, if not at baseline, improvement of mental status   Consultants:   Nephrology  Procedures:   HEMODIALYSIS  Antimicrobials:  Vancomycin IV    Subjective: No concerns today  Objective: Vitals:   05/02/21 1557 05/02/21 2000 05/03/21 0441 05/03/21 0737  BP: 124/77 113/81 111/75 110/63  Pulse: 97 (!) 101 85 91  Resp: 18 18 18 17   Temp: 99.2 F (37.3 C) 98.8 F (37.1 C) 98.5 F (36.9 C) (!) 97.4 F (36.3 C)  TempSrc: Oral Oral Oral Oral  SpO2: 95%  98% 98%  Weight:      Height:        Intake/Output Summary (Last 24 hours) at 05/03/2021 1240 Last data filed at 05/03/2021 0254 Gross  per 24 hour  Intake 90 ml  Output 500 ml  Net -410 ml   Filed Weights   04/30/21 1205 05/02/21 0755 05/02/21 1245  Weight: 55 kg 55.6 kg 55 kg     Examination:  General exam: Appears calm and comfortable Respiratory system: Clear to auscultation. Respiratory effort normal. Cardiovascular system: S1 & S2 heard, RRR. No murmurs, rubs, gallops or clicks. Gastrointestinal system: Abdomen is nondistended, soft and nontender. No organomegaly or masses felt. Normal bowel sounds heard. Central nervous system: Alert and oriented to person and place. Left sided hemiplegia Musculoskeletal: No edema. No calf tenderness Skin: No cyanosis. No rashes Psychiatry: Judgement and insight appear impaired.   Data Reviewed: I have personally reviewed following labs and imaging studies  CBC Lab Results  Component Value Date   WBC 8.6 05/03/2021   RBC 3.29 (L) 05/03/2021   HGB 9.5 (L) 05/03/2021   HCT 29.4 (L) 05/03/2021   MCV 89.4 05/03/2021   MCH 28.9 05/03/2021   PLT 229 05/03/2021   MCHC 32.3 05/03/2021   RDW 14.1 05/03/2021   LYMPHSABS 1.3 05/03/2021   MONOABS 0.7 05/03/2021   EOSABS 0.2 05/03/2021   BASOSABS 0.1 84/69/6295     Last metabolic panel Lab Results  Component Value Date   NA 133 (L) 05/03/2021   K 3.9 05/03/2021   CL 95 (L) 05/03/2021   CO2 28 05/03/2021   BUN 9 05/03/2021   CREATININE 2.86 (H) 05/03/2021   GLUCOSE 133 (H) 05/03/2021   GFRNONAA 17 (L) 05/03/2021   GFRAA 34 (L) 08/01/2018   CALCIUM 8.2 (L) 05/03/2021   PHOS 4.0 04/30/2021   PROT 6.8 04/29/2021   ALBUMIN 2.3 (L) 04/29/2021   BILITOT 0.7 04/29/2021   ALKPHOS 58 04/29/2021   AST 15 04/29/2021   ALT 14 04/29/2021   ANIONGAP 10 05/03/2021    CBG (last 3)  Recent Labs    05/02/21 2019 05/03/21 0745 05/03/21 1154  GLUCAP 104* 94 131*     GFR: Estimated Creatinine Clearance: 13.2 mL/min (A) (by C-G formula based on SCr of 2.86 mg/dL (H)).  Coagulation Profile: Recent Labs  Lab 04/30/21 1533  INR 1.0    Recent Results (from the past 240 hour(s))  Culture, blood (single) w Reflex to ID Panel     Status: None   Collection Time:  04/23/21 12:53 PM   Specimen: BLOOD  Result Value Ref Range Status   Specimen Description BLOOD BLOOD LEFT HAND  Final   Special Requests   Final    BOTTLES DRAWN AEROBIC AND ANAEROBIC Blood Culture adequate volume   Culture   Final    NO GROWTH 5 DAYS Performed at [facility], [address] [place], [place] [address]    Report Status 04/28/2021 FINAL  Final  Resp Panel by RT-PCR (Flu A&B, Covid) Nasopharyngeal Swab     Status: None   Collection Time: 04/28/21  5:47 PM   Specimen: Nasopharyngeal Swab; Nasopharyngeal(NP) swabs in vial transport medium  Result Value Ref Range Status   SARS Coronavirus 2 by RT PCR NEGATIVE NEGATIVE Final    Comment: (NOTE) SARS-CoV-2 target nucleic acids are NOT DETECTED.  The SARS-CoV-2 RNA is generally detectable in upper respiratory specimens during the acute phase of infection. The lowest concentration of SARS-CoV-2 viral copies this assay can detect is 138 copies/mL. A negative result does not preclude SARS-Cov-2 infection and should not be used as the sole basis for treatment or other patient management decisions. A negative result may  occur with  improper specimen collection/handling, submission of specimen other than nasopharyngeal swab, presence of viral mutation(s) within the areas targeted by this assay, and inadequate number of viral copies(<138 copies/mL). A negative result must be combined with clinical observations, patient history, and epidemiological information. The expected result is Negative.  Fact Sheet for Patients:  EntrepreneurPulse.com.au  Fact Sheet for Healthcare Providers:  IncredibleEmployment.be  This test is no t yet approved or cleared by the [place] FDA and  has been authorized for detection and/or diagnosis of SARS-CoV-2 by FDA under an Emergency Use Authorization (EUA). This EUA will remain  in effect (meaning this test can be used) for the duration of  the COVID-19 declaration under Section 564(b)(1) of the Act, 21 U.S.C.section 360bbb-3(b)(1), unless the authorization is terminated  or revoked sooner.       Influenza A by PCR NEGATIVE NEGATIVE Final   Influenza B by PCR NEGATIVE NEGATIVE Final    Comment: (NOTE) The Xpert Xpress SARS-CoV-2/FLU/RSV plus assay is intended as an aid in the diagnosis of influenza from Nasopharyngeal swab specimens and should not be used as a sole basis for treatment. Nasal washings and aspirates are unacceptable for Xpert Xpress SARS-CoV-2/FLU/RSV testing.  Fact Sheet for Patients: EntrepreneurPulse.com.au  Fact Sheet for Healthcare Providers: IncredibleEmployment.be  This test is not yet approved or cleared by the [place] FDA and has been authorized for detection and/or diagnosis of SARS-CoV-2 by FDA under an Emergency Use Authorization (EUA). This EUA will remain in effect (meaning this test can be used) for the duration of the COVID-19 declaration under Section 564(b)(1) of the Act, 21 U.S.C. section 360bbb-3(b)(1), unless the authorization is terminated or revoked.  Performed at [facility], [place] [address] [place], [place] [address]         Radiology Studies: No results found.      Scheduled Meds: . (feeding supplement) PROSource Plus  30 mL Oral TID BM  . Chlorhexidine Gluconate Cloth  6 each Topical Q0600  . cloNIDine  0.2 mg Transdermal Weekly  . feeding supplement (NEPRO CARB STEADY)  237 mL Oral BID BM  . Gerhardt's butt cream   Topical BID  . heparin  5,000 Units Subcutaneous Q8H  . insulin aspart  0-5 Units Subcutaneous QHS  . insulin aspart  0-6 Units Subcutaneous TID WC  . latanoprost  1 drop Both Eyes QHS  . letrozole  2.5 mg Oral Daily  . levETIRAcetam  500 mg Oral Q1500   And  . levETIRAcetam  250 mg Oral Once per day on Mon Wed Fri  . levothyroxine  112 mcg Oral QAC breakfast  . metoprolol succinate  50 mg  Oral Daily  . multivitamin  1 tablet Oral QHS  . [START ON 05/06/2021] vancomycin  500 mg Intravenous Q M,W,F-HD   Continuous Infusions:   LOS: 5 days     Cordelia Poche, MD [facility] 05/03/2021, 12:40 PM  If 7PM-7AM, please contact night-coverage [email or web address]

## 2021-05-03 NOTE — Plan of Care (Signed)

## 2021-05-03 NOTE — Plan of Care (Signed)

## 2021-05-03 NOTE — Progress Notes (Signed)
[facility] Progress Note   75 y.o. female DMT2, HTN, GERD, hypothyroidism, HLD, Hx CVA, seizure disorder and ESRD new start on HD as of 04/10/2021 at [facility].   (MWF) at [facility] but refused to be dialyzed at [facility]. Poor historian, recent admit to [facility] from 5/5-5/20/2022 due to new ESRD requiring dialysis, anemia of CKD, streptococcal bacteremia and AMS/delirium.  Patient son refused TEE and patient discharged on IV Vancomycin with HD until 06/01/21.     Assessment/ Plan:   1. AMS - likely multifactorial. Underlying dementia, recent bacteremia and missed dialysis.  Baseline unclear. Primary considering involving psych for capacity and palliative care for goals of care. More alert past 2 days and interacting appropriately 2.  ESRD -  Recent new start with MWF schedule but  had not started at outpatient [facility].   Tolerated HD 5/25 with net -1.5L, net -1L 5/26, - 546ml on 5/28.  Next  HD tomorrow  back to  regular schedule MWF; I have requested we try RRT in a chair as tolerated to help facilitate discharge.  She's remains more alert and awake  this AM and cooperative. I wonder if she just has these intervals of lucidity. Very confused 2 days ago   3.  Hypertension/volume  - BP in goal.  Continue home meds and hold prior to dialysis. Does not appear grossly volume overloaded.  UF as tolerated.   4. Streptococcal bacteremia - on Vanc until 6/27. Per primary Anemia of CKD - Hgb 10.2 Not on ESA. Follow trends. 5.  Secondary Hyperparathyroidism -  CCa at goal. Phos 4 5/26.  Not on VDRA or binders.  6.  Nutrition - Renal diet w/fluid restrictions 7. Seizure disorder - on Keppra 8. DMT2 - per primary   Subjective:   Denies nausea this am; also  denies abd pain or diarrhea or dyspnea.  C/o some leg pain b/l.    Objective:   BP 111/75 (BP Location: Left Arm)   Pulse 85   Temp 98.5 F (36.9 C) (Oral)   Resp 18   Ht 5\' 2"  (1.575 m)   Wt 55 kg   SpO2 98%   BMI 22.18 kg/m    Intake/Output Summary (Last 24 hours) at 05/03/2021 1740 Last data filed at 05/02/2021 2245 Gross per 24 hour  Intake 50 ml  Output 500 ml  Net -450 ml   Weight change:   Physical Exam: General: NAD NCAT Alert and oriented to person, place and ~time (thought it was 2021) Lungs: CTA bilaterally.  Heart: RRR. No murmur, rubs or gallops.  Abdomen: soft, nontender, +BS Lower extremities:trace edema.  Psych:  Responds to questions  Appropriately Dialysis Access: RIJ [facility]  Imaging: No results found.  Labs: BMET Recent Labs  Lab 04/28/21 1513 04/29/21 0316 04/30/21 0132 05/01/21 0058 05/02/21 0034 05/03/21 0048  NA 136 136 136 134* 134* 133*  K 4.0 4.1 3.7 4.0 4.0 3.9  CL 96* 98 97* 98 97* 95*  CO2 23 23 24 27 26 28   GLUCOSE 124* 107* 88 96 90 133*  BUN 62* 66* 18 18 29* 9  CREATININE 8.71* 9.13* 3.78* 3.54* 5.48* 2.86*  CALCIUM 8.6* 8.6* 7.7* 8.0* 8.6* 8.2*  PHOS  --   --  4.0  --   --   --    CBC Recent Labs  Lab 04/30/21 0132 05/01/21 0058 05/02/21 0034 05/03/21 0048  WBC 11.6* 7.6 7.4 8.6  NEUTROABS 9.6* 5.1 4.9 6.4  HGB 9.4* 8.8* 8.8*  9.5*  HCT 29.5* 26.9* 27.6* 29.4*  MCV 90.5 90.0 90.5 89.4  PLT 396 269 288 229    Medications:    . (feeding supplement) PROSource Plus  30 mL Oral TID BM  . Chlorhexidine Gluconate Cloth  6 each Topical Q0600  . cloNIDine  0.2 mg Transdermal Weekly  . feeding supplement (NEPRO CARB STEADY)  237 mL Oral BID BM  . Gerhardt's butt cream   Topical BID  . heparin  5,000 Units Subcutaneous Q8H  . insulin aspart  0-5 Units Subcutaneous QHS  . insulin aspart  0-6 Units Subcutaneous TID WC  . latanoprost  1 drop Both Eyes QHS  . letrozole  2.5 mg Oral Daily  . levETIRAcetam  500 mg Oral Q1500   And  . levETIRAcetam  250 mg Oral Once per day on Mon Wed Fri  . levothyroxine  112 mcg Oral QAC breakfast  . metoprolol succinate  50 mg Oral Daily  . multivitamin  1 tablet Oral QHS  . [START ON 05/06/2021] vancomycin  500 mg  Intravenous Q M,W,F-HD      Otelia Santee, MD 05/03/2021, 7:07 AM

## 2021-05-04 ENCOUNTER — Inpatient Hospital Stay (HOSPITAL_COMMUNITY): Payer: Medicare (Managed Care)

## 2021-05-04 LAB — GLUCOSE, CAPILLARY
Glucose-Capillary: 98 mg/dL (ref 70–99)
Glucose-Capillary: 85 mg/dL (ref 70–99)
Glucose-Capillary: 95 mg/dL (ref 70–99)
Glucose-Capillary: 143 mg/dL — ABNORMAL HIGH (ref 70–99)
Glucose-Capillary: 121 mg/dL — ABNORMAL HIGH (ref 70–99)

## 2021-05-04 LAB — CBC WITH DIFFERENTIAL/PLATELET
Abs Immature Granulocytes: 0.02 10*3/uL (ref 0.00–0.07)
Basophils Absolute: 0.1 10*3/uL (ref 0.0–0.1)
Basophils Relative: 1 %
Eosinophils Absolute: 0.2 10*3/uL (ref 0.0–0.5)
Eosinophils Relative: 4 %
HCT: 30.1 % — ABNORMAL LOW (ref 36.0–46.0)
Hemoglobin: 9.6 g/dL — ABNORMAL LOW (ref 12.0–15.0)
Immature Granulocytes: 0 %
Lymphocytes Relative: 20 %
Lymphs Abs: 1.3 10*3/uL (ref 0.7–4.0)
MCH: 29.1 pg (ref 26.0–34.0)
MCHC: 31.9 g/dL (ref 30.0–36.0)
MCV: 91.2 fL (ref 80.0–100.0)
Monocytes Absolute: 0.7 10*3/uL (ref 0.1–1.0)
Monocytes Relative: 11 %
Neutro Abs: 4.2 10*3/uL (ref 1.7–7.7)
Neutrophils Relative %: 64 %
Platelets: 264 10*3/uL (ref 150–400)
RBC: 3.3 MIL/uL — ABNORMAL LOW (ref 3.87–5.11)
RDW: 14.1 % (ref 11.5–15.5)
WBC: 6.5 10*3/uL (ref 4.0–10.5)
nRBC: 0 % (ref 0.0–0.2)

## 2021-05-04 LAB — BASIC METABOLIC PANEL
Anion gap: 12 (ref 5–15)
BUN: 22 mg/dL (ref 8–23)
CO2: 26 mmol/L (ref 22–32)
Calcium: 9 mg/dL (ref 8.9–10.3)
Chloride: 96 mmol/L — ABNORMAL LOW (ref 98–111)
Creatinine, Ser: 4.77 mg/dL — ABNORMAL HIGH (ref 0.44–1.00)
GFR, Estimated: 9 mL/min — ABNORMAL LOW (ref >60)
Glucose, Bld: 93 mg/dL (ref 70–99)
Potassium: 4 mmol/L (ref 3.5–5.1)
Sodium: 134 mmol/L — ABNORMAL LOW (ref 135–145)

## 2021-05-04 LAB — MAGNESIUM: Magnesium: 2.2 mg/dL (ref 1.7–2.4)

## 2021-05-04 IMAGING — DX DG CHEST 1V PORT
1 series · 2 of 2 positions shown · non-contrast
Comparison: [DATE] [DATE], [DATE], [DATE] [DATE], [DATE]

CLINICAL DATA: Hypoxia

EXAM:
PORTABLE CHEST 1 VIEW

[Series 1: chest · 0.14mm/px · 2 of 2 slices shown]
[im 1/2]
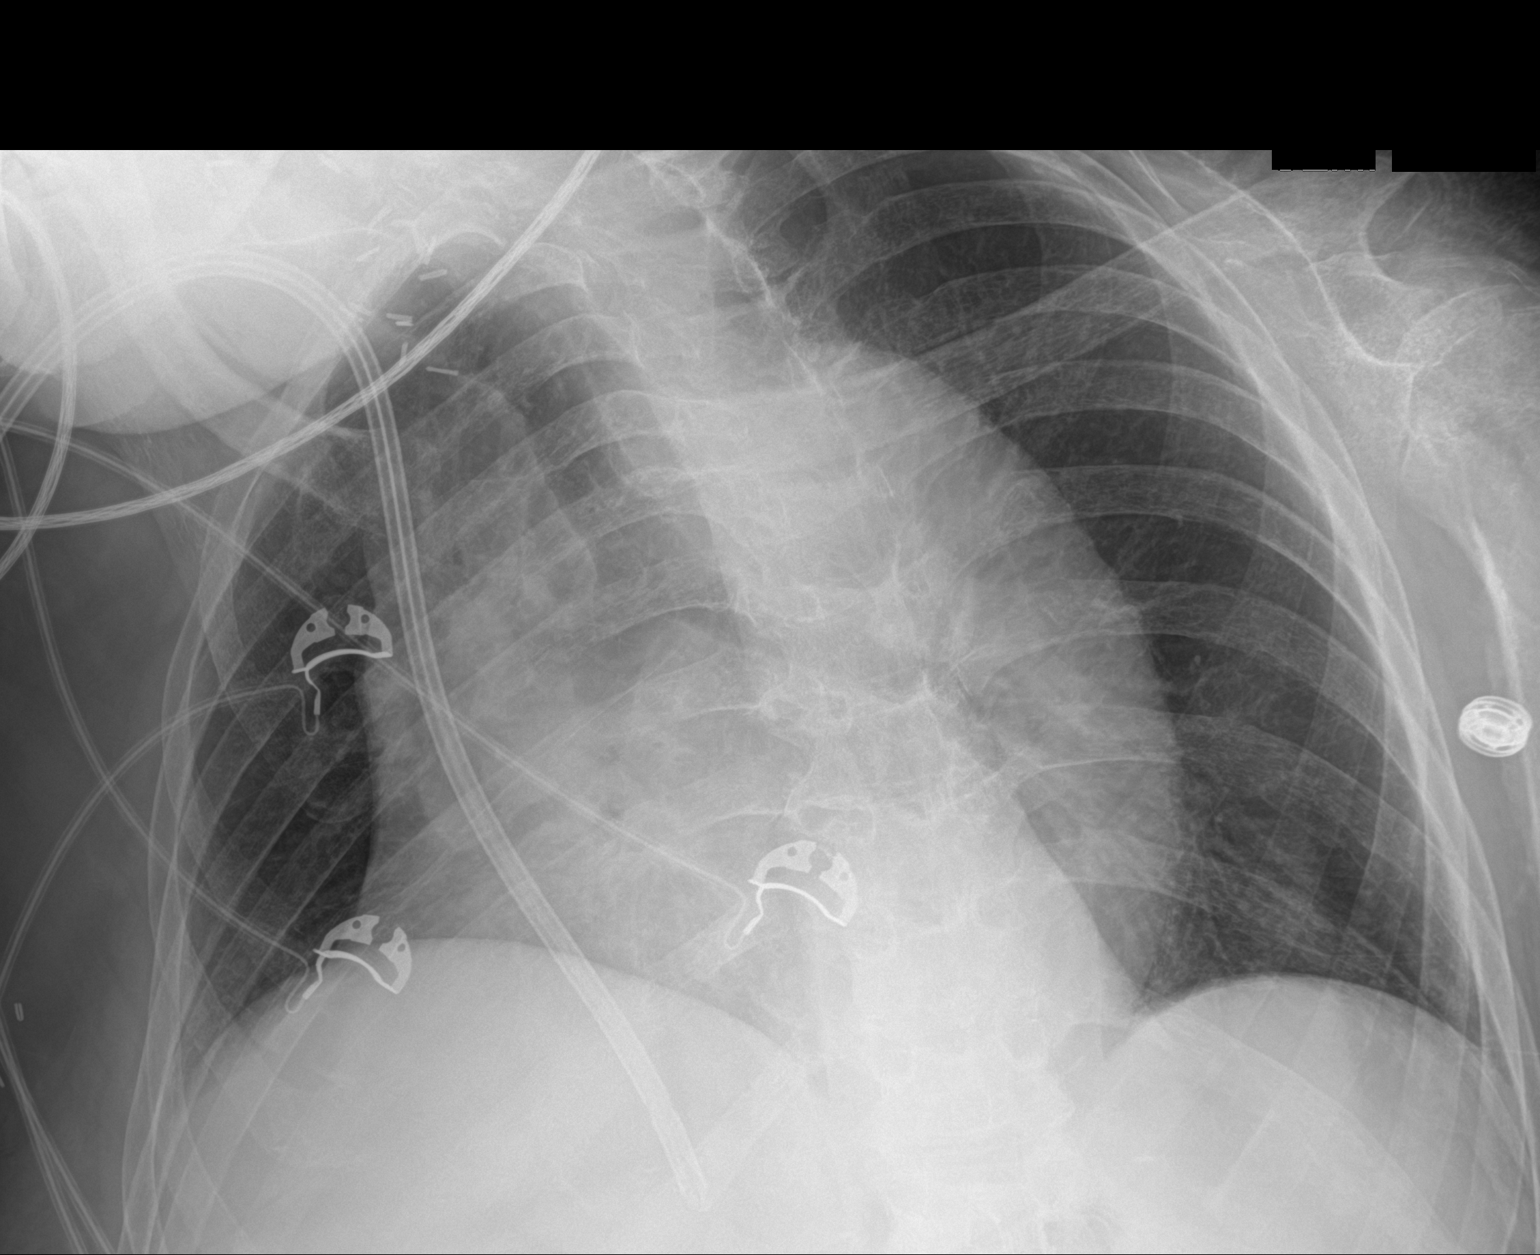
[im 2/2]
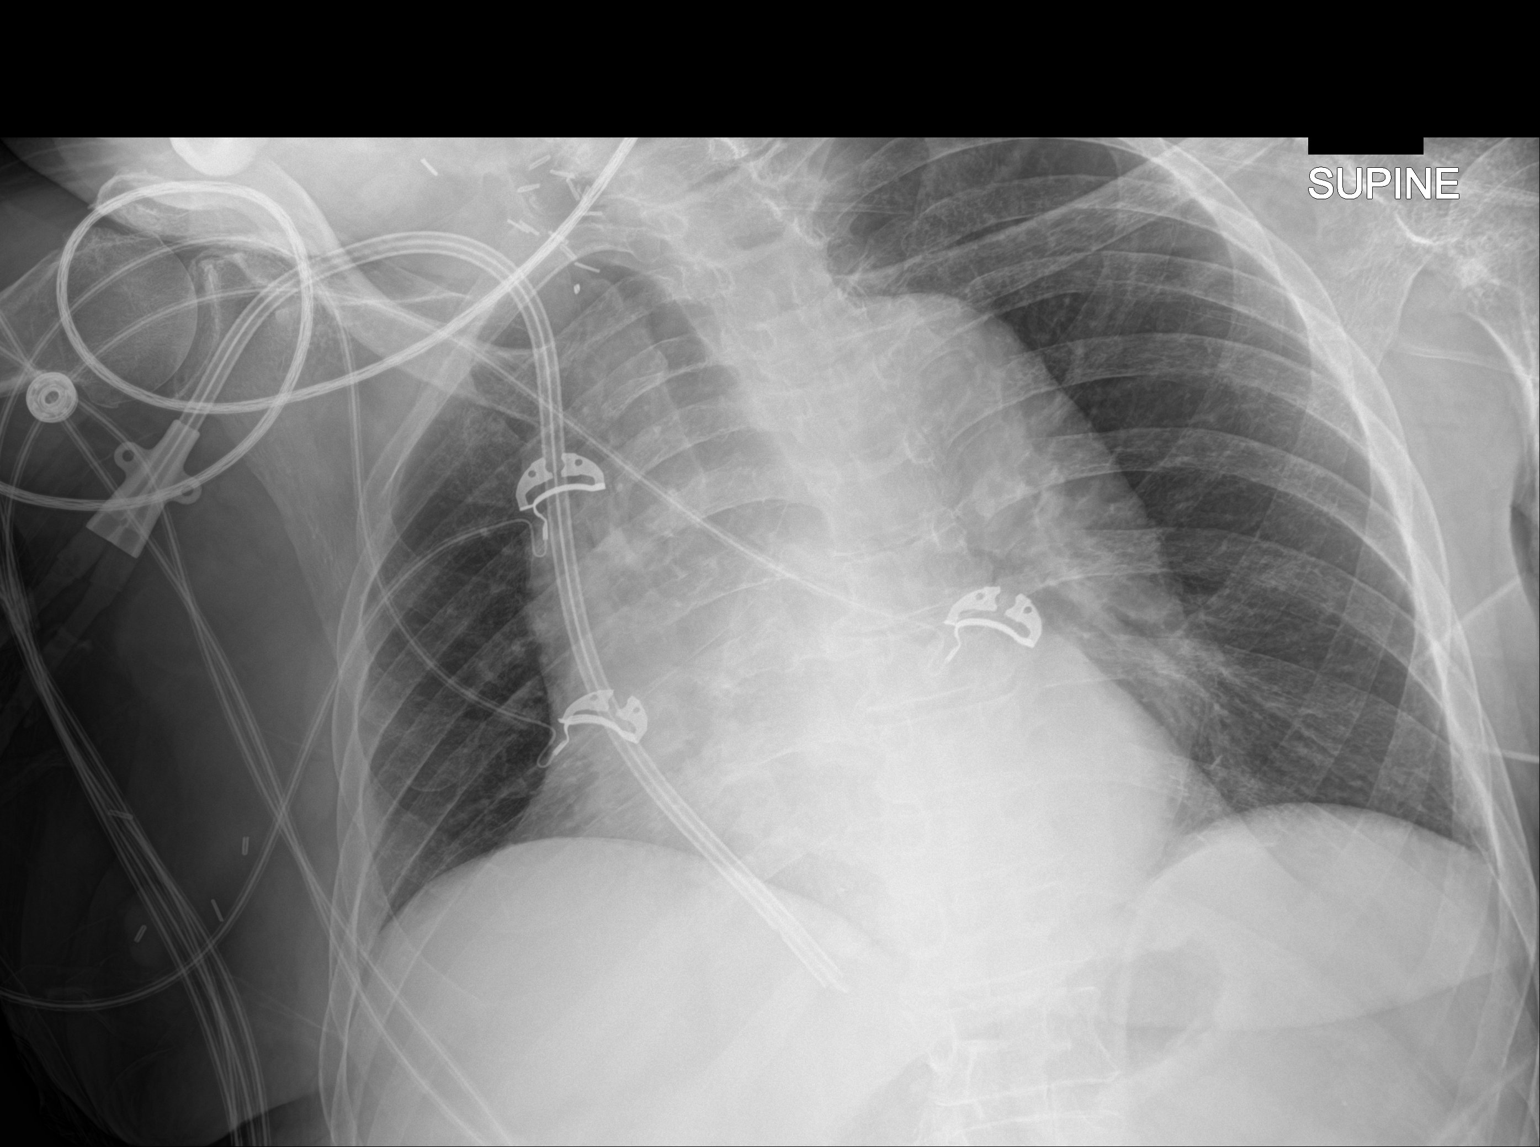

[2 of 2 positions shown; findings below may reference images not displayed]

FINDINGS: Evaluation is limited secondary to patient positioning. Unchanged
enlarged cardiomediastinal silhouette when accounting for patient
rotation. RIGHT IJ CVC tip terminates over the RIGHT atrium. No
pleural effusion or pneumothorax. Surgical clips project over the
neck. No acute pleuroparenchymal abnormality. Unchanged elevation of
the RIGHT hemidiaphragm. Degenerative changes of the thoracic spine.
IMPRESSION: No acute cardiopulmonary abnormality.

## 2021-05-04 MED ORDER — SODIUM CHLORIDE 0.9 % IV BOLUS
250.0000 mL | Freq: Once | INTRAVENOUS | Status: AC
  Administered 2021-05-04: 250 mL via INTRAVENOUS

## 2021-05-04 MED ORDER — DARBEPOETIN ALFA 25 MCG/0.42ML IJ SOSY
PREFILLED_SYRINGE | INTRAMUSCULAR | Status: AC
  Filled 2021-05-04: qty 0.42

## 2021-05-04 MED ORDER — VANCOMYCIN HCL IN DEXTROSE 500-5 MG/100ML-% IV SOLN
INTRAVENOUS | Status: AC
  Administered 2021-05-04: 500 mg
  Filled 2021-05-04: qty 100

## 2021-05-04 MED ORDER — VANCOMYCIN HCL 500 MG/100ML IV SOLN
500.0000 mg | Freq: Once | INTRAVENOUS | Status: DC
  Filled 2021-05-04: qty 100

## 2021-05-04 MED ORDER — ACETAMINOPHEN 325 MG PO TABS
650.0000 mg | ORAL_TABLET | Freq: Four times a day (QID) | ORAL | Status: DC | PRN
  Administered 2021-05-06 – 2021-05-11 (×4): 650 mg via ORAL
  Filled 2021-05-04 (×4): qty 2

## 2021-05-04 MED ORDER — DARBEPOETIN ALFA 25 MCG/0.42ML IJ SOSY
25.0000 ug | PREFILLED_SYRINGE | INTRAMUSCULAR | Status: DC
  Filled 2021-05-04: qty 0.42

## 2021-05-04 MED ORDER — HEPARIN SODIUM (PORCINE) 1000 UNIT/ML IJ SOLN
INTRAMUSCULAR | Status: AC
  Filled 2021-05-04: qty 4

## 2021-05-04 NOTE — Progress Notes (Signed)
Nutrition Follow Up  DOCUMENTATION CODES:   Not applicable  INTERVENTION:  Feeding assistance with meals and supplements  Liberalize diet if intake remains poor  -Nepro Shake po BID, each supplement provides 425 kcal and 19 grams protein -Magic cup TID with meals, each supplement provides 290 kcal and 9 grams of protein -Renal MVI daily  NUTRITION DIAGNOSIS:   Increased nutrient needs related to chronic illness (ESRD on HD) as evidenced by estimated needs.  Ongoing  GOAL:   Patient will meet greater than or equal to 90% of their needs   Progressing  MONITOR:   PO intake,Supplement acceptance,Labs,Weight trends,Skin,I & O's  REASON FOR ASSESSMENT:   Malnutrition Screening Tool    ASSESSMENT:   Kirsten Mcmahon is a 75 y.o. female with medical history significant of ESRD, having just been initiated on dialysis earlier this month, GERD, hypertension, hypothyroidism, non-insulin-dependent diabetes type 2, hyperlipidemia, CVA history, and seizure disorder who presents today from dialysis center after refusing dialysis stating "I already had dialysis today before I was discharged from the hospital" per reportPt admitted with ESRD with missed dialysis and questionable acute metabolic encephalopathy.  5/26- s/p HD catheter exchange   Patient unable to provide intake information. Last four meal completions charted as 25%, 20%, 50%, and 50%.  Refused Nepro and ProSource. 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 Encourage nursing assistance with supplements.   Spoke with son regarding renal diet. Reviewed food groups and provided written recommended serving sizes specifically determined for patient's current nutritional status. Explained why diet restrictions are needed and provided lists of foods to limit/avoid that are high potassium, sodium, and phosphorus. Provided specific recommendations on safer alternatives of these foods. Discussed importance of protein intake at each meal and  snack. Provided examples of how to maximize protein intake throughout the day. Discussed need for fluid restriction with dialysis, importance of minimizing weight gain between HD treatments, and renal-friendly beverage options.  Provided Renal Pyramid to patient/family in AVS.   No EDW determined at this time (new HD start) Admission weight: 57 kg  Current weight: 55 kg   Medications: aranesp, SS novolog Labs: Na 134 (L) CBG 85-131  Diet Order:   Diet Order            Diet renal with fluid restriction Fluid restriction: 1200 mL Fluid; Room service appropriate? Yes; Fluid consistency: Thin  Diet effective now                 EDUCATION NEEDS:   Not appropriate for education at this time  Skin:  Skin Assessment: Skin Integrity Issues: Skin Integrity Issues:: Stage II Stage II: buttocks Other: MASD to bilateral gluteal folds  Last BM:  5/27  Height:   Ht Readings from Last 1 Encounters:  04/29/21 5\' 2"  (1.575 m)    Weight:   Wt Readings from Last 1 Encounters:  05/02/21 55 kg    Ideal Body Weight:  50 kg  BMI:  Body mass index is 22.18 kg/m.  Estimated Nutritional Needs:   Kcal:  1650-1850  Protein:  75-90 grams  Fluid:  1000 ml + UOP   Mariana Single RD, LDN Clinical Nutrition Pager listed in [place]
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
## 2021-05-04 NOTE — Progress Notes (Addendum)
[facility] Progress Note   Subjective:   Pt seen in room, states "I'm feeling good." Denies SOB, CP, palpitations, dizziness, abdominal pain and nausea.   Objective Vitals:   05/03/21 0441 05/03/21 0737 05/03/21 2109 05/04/21 0502  BP: 111/75 110/63 111/76 (!) 109/93  Pulse: 85 91 82 85  Resp: 18 17 18 17   Temp: 98.5 F (36.9 C) (!) 97.4 F (36.3 C) 98.1 F (36.7 C) 98.2 F (36.8 C)  TempSrc: Oral Oral Oral Axillary  SpO2: 98% 98% 94% 96%  Weight:      Height:       Physical Exam General: Well developed female, alert and in NAD Heart: RRR, no murmurs, rubs or gallops Lungs: CTA bilaterally without wheezing, rhonchi or rales Abdomen: Soft, non-distended, +BS Extremities: No edema b/l lower extremities Dialysis Access: R [facility]  Additional Objective Labs: Basic Metabolic Panel: Recent Labs  Lab 04/30/21 0132 05/01/21 0058 05/02/21 0034 05/03/21 0048 05/04/21 0534  NA 136   < > 134* 133* 134*  K 3.7   < > 4.0 3.9 4.0  CL 97*   < > 97* 95* 96*  CO2 24   < > 26 28 26   GLUCOSE 88   < > 90 133* 93  BUN 18   < > 29* 9 22  CREATININE 3.78*   < > 5.48* 2.86* 4.77*  CALCIUM 7.7*   < > 8.6* 8.2* 9.0  PHOS 4.0  --   --   --   --    < > = values in this interval not displayed.   Liver Function Tests: Recent Labs  Lab 04/29/21 0316  AST 15  ALT 14  ALKPHOS 58  BILITOT 0.7  PROT 6.8  ALBUMIN 2.3*   No results for input(s): LIPASE, AMYLASE in the last 168 hours. CBC: Recent Labs  Lab 04/30/21 0132 05/01/21 0058 05/02/21 0034 05/03/21 0048 05/04/21 0534  WBC 11.6* 7.6 7.4 8.6 6.5  NEUTROABS 9.6* 5.1 4.9 6.4 4.2  HGB 9.4* 8.8* 8.8* 9.5* 9.6*  HCT 29.5* 26.9* 27.6* 29.4* 30.1*  MCV 90.5 90.0 90.5 89.4 91.2  PLT 396 269 288 229 264   Blood Culture    Component Value Date/Time   SDES BLOOD BLOOD LEFT HAND 04/23/2021 1253   SPECREQUEST  04/23/2021 1253    BOTTLES DRAWN AEROBIC AND ANAEROBIC Blood Culture adequate volume   CULT  04/23/2021  1253    NO GROWTH 5 DAYS Performed at [facility], [address] [place], [place] [address]    REPTSTATUS 04/28/2021 FINAL 04/23/2021 1253    CBG: Recent Labs  Lab 05/03/21 0745 05/03/21 1154 05/03/21 2115 05/04/21 0505 05/04/21 0737  GLUCAP 94 131* 96 98 85   Medications:  . (feeding supplement) PROSource Plus  30 mL Oral TID BM  . Chlorhexidine Gluconate Cloth  6 each Topical Q0600  . cloNIDine  0.2 mg Transdermal Weekly  . feeding supplement (NEPRO CARB STEADY)  237 mL Oral BID BM  . Gerhardt's butt cream   Topical BID  . heparin  5,000 Units Subcutaneous Q8H  . insulin aspart  0-5 Units Subcutaneous QHS  . insulin aspart  0-6 Units Subcutaneous TID WC  . latanoprost  1 drop Both Eyes QHS  . letrozole  2.5 mg Oral Daily  . levETIRAcetam  500 mg Oral Q1500   And  . levETIRAcetam  250 mg Oral Once per day on Mon Wed Fri  . levothyroxine  112 mcg Oral QAC breakfast  .  metoprolol succinate  50 mg Oral Daily  . multivitamin  1 tablet Oral QHS  . [START ON 05/06/2021] vancomycin  500 mg Intravenous Q M,W,F-HD    75 y.o.femaleDMT2, HTN, GERD, hypothyroidism, HLD, Hx CVA, seizure disorder andESRD new start on HD as of 04/10/2021 at [facility].  (MWF) at [facility] but refused to be dialyzed at [facility]. Poor historian, recent admit to [facility] from 5/5-5/20/2022 due to new ESRD requiring dialysis, anemia of CKD, streptococcal bacteremia and AMS/delirium. Patient son refused TEE and patient discharged on IV Vancomycin with HD until 06/01/21.   Assessment/Plan:   1. AMS- likely multifactorial. Underlying dementia, recent bacteremia and missed dialysis. Baseline unclear. Primary considering involving psych for capacity and palliative care for goals of care. More alert past few days and interacting appropriately 2. ESRD- Recent new start with MWF schedule but  had not started at [facility]. Tolerated HD 5/25 with net -1.5L, net -1L 5/26, - 571ml on 5/28. Will attempt HD  in chair today.  3. Hypertension/volume- BP at goal. Continue home meds and hold prior to dialysis. Does not appear grossly volume overloaded. UF as tolerated.  4. Streptococcal bacteremia - on Vanc until 6/27. Per primary 5. Anemiaof CKD- Hgb 9.6. Will start aranesp with HD, hold off on IV Fe given bacteremia.  6. Secondary Hyperparathyroidism -CCa slightly high, will use low calcium bath. Phos 4 5/26. Not on VDRA or binders.  7. Nutrition- Renal diet w/fluid restrictions 8. Seizure disorder - on Keppra 9. DMT2 - per primary   Addendum 4:40pm: HD RN called regarding variable O2 saturations, SpO2 dropping into the 80's intermittently so placed on [place] but had difficulty keeping it on so was placed on NRB. Pt evaluated in HD, appears to be at baseline MS and denies SOB, CP, palpitations, dizziness. BP stable. Mildly tachycardic with regular rhythm. Lungs CTA bilaterally. Residual L sided weakness but exam otherwise unremarkable. Two O2 probes on R hand giving variable oxygen readings, primarily in the 90's but will drop to high 80's intermittently and probe will occasionally lose reading despite pt being alert and stable appearing. O2 sats quickly return to 90's without any intervention. Suspect this may be a probe/vascular issue giving variable readings, but also ordered CXR to rule out new pulmonary process. Pt remains alert and comfortable.    Anice Paganini, PA-C 05/04/2021, 9:45 AM  [facility] Pager: [phone]

## 2021-05-04 NOTE — Progress Notes (Signed)
Pharmacy Antibiotic Note  Kirsten Mcmahon is a 75 y.o. female admitted on 04/28/2021 with group B Strep bacteremia.  Pharmacy has been consulted for vancomycin dosing in patient with ESRD - HD MWF. Patient was briefly off HD schedule over the weekend and received intermittent HD on 5/26 and 5/28. Patient was administered vancomycin on these days as one time doses. Plan to get back on schedule today if patient able to tolerate HD. Patient is currently in HD today with plans to get back to normal MWF schedule today. Orders are entered to start with this schedule on 6/1. Today, WBC 6.5 and patient is afebrile. Of note, patient has difficulty tolerating HD sessions and important to monitor ability to complete HD sessions.   Plan:  Vancomycin 500 mg IV after HD on HD days  Consider pre-HD level on 6/1 as patient gets back to normal HD schedule    Height: 5\' 2"  (157.5 cm) Weight: 56.3 kg (124 lb 1.9 oz) IBW/kg (Calculated) : 50.1  Temp (24hrs), Avg:97.9 F (36.6 C), Min:97.5 F (36.4 C), Max:98.2 F (36.8 C)  Recent Labs  Lab 04/30/21 0132 05/01/21 0058 05/02/21 0034 05/03/21 0048 05/04/21 0534  WBC 11.6* 7.6 7.4 8.6 6.5  CREATININE 3.78* 3.54* 5.48* 2.86* 4.77*    Estimated Creatinine Clearance: 7.9 mL/min (A) (by C-G formula based on SCr of 4.77 mg/dL (H)).    Allergies  Allergen Reactions  . Contrast Media [Iodinated Diagnostic Agents]     Other reaction(s): NO ALLERGY  . Latex     Other reaction(s): NO ALLERGY  . Shellfish-Derived Products     Other reaction(s): NO ALLERGY  . Levemir [Insulin Detemir] Itching    Thank you for involving pharmacy in this patient's care.  Cephus Slater, PharmD, MBA Pharmacy Resident [phone] 05/04/2021 3:30 PM  **Pharmacist phone directory can be found on [email or web address] listed under [place]**

## 2021-05-04 NOTE — Progress Notes (Signed)
Notified Dr. Sidney Ace that pt's HR is now 104. MD stated to give NS bolus 250 cc as ordered. MD aware that pt doesn't receive dialysis till Wednesday. Will continue to monitor pt. Racheal Patches RN

## 2021-05-04 NOTE — Progress Notes (Signed)
PROGRESS NOTE    Kirsten Mcmahon  KPT:[id number] DOB: 1946/06/08 DOA: 04/28/2021 PCP: [place]   Brief Narrative: Kirsten Mcmahon is a 75 y.o. female with a history of streptococcal bacteremia currently on vancomycin IV,ESRD, GERD, hypertension, hypothyroidism, non-insulin-dependent diabetes type 2, hyperlipidemia, CVA history. Patient presented secondary to confusion with concern for uremia secondary to unintentionally missed HD from significant amount of pain at home. She was admitted and started on HD.   Assessment & Plan:   Principal Problem:   ESRD (end stage renal disease) ([place]) Active Problems:   Hypertension   Hypothyroidism   Type II diabetes mellitus with renal manifestations (HCC)   Streptococcal bacteremia   Pressure injury of skin   Physical deconditioning   Generalized muscle weakness   ESRD on HD Newly started on hemodialysis earlier this month. Nephrology consulted and are continuing HD while inpatient on a MWF schedule which has been complicated by mental status. HD today. -Nephrology recommendations for HD; patient needs to tolerate HD in a chair prior to discharge.  Confusion Unsure of etiology. On admission, however, concern was uremic encephalopathy. Difficult to know what her baseline is. Per nephrology, mental status has not really improved since admission. I tried contacting son, PACE, nephrology, previous hospitalist, and reviewing prior notes, but could not get an answer on recent baseline. No imaging performed, however she does not have any new focal deficits concerning for acute intracranial pathology. No fevers or leukocytosis to suggest possible persistent bacteremia and most recent blood cultures were clear from previous hospitalization. It is possible patient has underlying cognitive impairment as this was suggested as a possibility from prior admission and may be at her baseline. Possibly stable at this time. -I have asked  family to assist nursing by being at bedside as much as possible to help orient Kirsten Mcmahon  Streptococcus group G bacteremia This was diagnosed during previous hospitalization. At that time, Transesophageal Echocardiogram was recommended but not performed secondary to patient/family decline. Patient was discharged with recommendations for ampicillin IV until 06/01/2021; however, this appears to have changed to Vancomycin IV. On this admission, she was continued on Vancomycin IV.  Anemia of chronic kidney disease Stable. No evidence of hemorrhage.  Primary hypertension Patient is on metoprolol and clonidine as an outpatient -Continue Toprol XL 50 mg daily and clonidine patch 0.2 mg q week  Hypothyroidism -Continue Synthroid 112 mcg daily  Diabetes mellitus, type 2 Well controlled. Patient is on Januvia as an outpatient. Last hemoglobin A1C of 5.7. Started on SSI and and Tradjenta inpatient. Hypoglycemic episodes. -Continue SSI -Will hold Tradjenta   History of seizures -Continue Keppra 500 mg daily and Keppra 250 mg three times weekly  Pressure injury Mid buttocks, present on admission.   DVT prophylaxis: Heparin subq Code Status:   Code Status: Full Code Family Communication: Son on telephone (10 minute) Disposition Plan: Discharge home with [facility]ACE services likely in 1-3 days pending ability to dialyze and, if not at baseline, improvement of mental status   Consultants:   Nephrology  Procedures:   HEMODIALYSIS  Antimicrobials:  Vancomycin IV    Subjective: No issues. Called for her daughter, Kirsten Mcmahon, but I informed her that Kirsten Mcmahon is not in the room; she was surprised.   Objective: Vitals:   05/03/21 0737 05/03/21 2109 05/04/21 0502 05/04/21 1250  BP: 110/63 111/76 (!) 109/93 92/64  Pulse: 91 82 85 89  Resp: 17 18 17 20   Temp: (!) 97.4 F (36.3 C) 98.1 F (  36.7 C) 98.2 F (36.8 C) 97.7 F (36.5 C)  TempSrc: Oral Oral Axillary Oral  SpO2: 98% 94% 96% 98%   Weight:      Height:        Intake/Output Summary (Last 24 hours) at 05/04/2021 1408 Last data filed at 05/04/2021 0900 Gross per 24 hour  Intake 60 ml  Output --  Net 60 ml   Filed Weights   04/30/21 1205 05/02/21 0755 05/02/21 1245  Weight: 55 kg 55.6 kg 55 kg    Examination:  General exam: Appears calm and comfortable Respiratory system: Clear to auscultation. Respiratory effort normal. Cardiovascular system: S1 & S2 heard, RRR. No murmurs, rubs, gallops or clicks. Gastrointestinal system: Abdomen is nondistended, soft and nontender. No organomegaly or masses felt. Normal bowel sounds heard. Central nervous system: Alert and oriented to person, place and year. Left hemiparesis Musculoskeletal: No edema. No calf tenderness Skin: No cyanosis. No rashes Psychiatry: Judgement and insight appear impaired.   Data Reviewed: I have personally reviewed following labs and imaging studies  CBC Lab Results  Component Value Date   WBC 6.5 05/04/2021   RBC 3.30 (L) 05/04/2021   HGB 9.6 (L) 05/04/2021   HCT 30.1 (L) 05/04/2021   MCV 91.2 05/04/2021   MCH 29.1 05/04/2021   PLT 264 05/04/2021   MCHC 31.9 05/04/2021   RDW 14.1 05/04/2021   LYMPHSABS 1.3 05/04/2021   MONOABS 0.7 05/04/2021   EOSABS 0.2 05/04/2021   BASOSABS 0.1 22/01/5426     Last metabolic panel Lab Results  Component Value Date   NA 134 (L) 05/04/2021   K 4.0 05/04/2021   CL 96 (L) 05/04/2021   CO2 26 05/04/2021   BUN 22 05/04/2021   CREATININE 4.77 (H) 05/04/2021   GLUCOSE 93 05/04/2021   GFRNONAA 9 (L) 05/04/2021   GFRAA 34 (L) 08/01/2018   CALCIUM 9.0 05/04/2021   PHOS 4.0 04/30/2021   PROT 6.8 04/29/2021   ALBUMIN 2.3 (L) 04/29/2021   BILITOT 0.7 04/29/2021   ALKPHOS 58 04/29/2021   AST 15 04/29/2021   ALT 14 04/29/2021   ANIONGAP 12 05/04/2021    CBG (last 3)  Recent Labs    05/04/21 0505 05/04/21 0737 05/04/21 1253  GLUCAP 98 85 95     GFR: Estimated Creatinine Clearance: 7.9  mL/min (A) (by C-G formula based on SCr of 4.77 mg/dL (H)).  Coagulation Profile: Recent Labs  Lab 04/30/21 1533  INR 1.0    Recent Results (from the past 240 hour(s))  Resp Panel by RT-PCR (Flu A&B, Covid) Nasopharyngeal Swab     Status: None   Collection Time: 04/28/21  5:47 PM   Specimen: Nasopharyngeal Swab; Nasopharyngeal(NP) swabs in vial transport medium  Result Value Ref Range Status   SARS Coronavirus 2 by RT PCR NEGATIVE NEGATIVE Final    Comment: (NOTE) SARS-CoV-2 target nucleic acids are NOT DETECTED.  The SARS-CoV-2 RNA is generally detectable in upper respiratory specimens during the acute phase of infection. The lowest concentration of SARS-CoV-2 viral copies this assay can detect is 138 copies/mL. A negative result does not preclude SARS-Cov-2 infection and should not be used as the sole basis for treatment or other patient management decisions. A negative result may occur with  improper specimen collection/handling, submission of specimen other than nasopharyngeal swab, presence of viral mutation(s) within the areas targeted by this assay, and inadequate number of viral copies(<138 copies/mL). A negative result must be combined with clinical observations, patient history, and epidemiological information. The  expected result is Negative.  Fact Sheet for Patients:  EntrepreneurPulse.com.au  Fact Sheet for Healthcare Providers:  IncredibleEmployment.be  This test is no t yet approved or cleared by the [place] FDA and  has been authorized for detection and/or diagnosis of SARS-CoV-2 by FDA under an Emergency Use Authorization (EUA). This EUA will remain  in effect (meaning this test can be used) for the duration of the COVID-19 declaration under Section 564(b)(1) of the Act, 21 U.S.C.section 360bbb-3(b)(1), unless the authorization is terminated  or revoked sooner.       Influenza A by PCR NEGATIVE NEGATIVE Final    Influenza B by PCR NEGATIVE NEGATIVE Final    Comment: (NOTE) The Xpert Xpress SARS-CoV-2/FLU/RSV plus assay is intended as an aid in the diagnosis of influenza from Nasopharyngeal swab specimens and should not be used as a sole basis for treatment. Nasal washings and aspirates are unacceptable for Xpert Xpress SARS-CoV-2/FLU/RSV testing.  Fact Sheet for Patients: EntrepreneurPulse.com.au  Fact Sheet for Healthcare Providers: IncredibleEmployment.be  This test is not yet approved or cleared by the [place] FDA and has been authorized for detection and/or diagnosis of SARS-CoV-2 by FDA under an Emergency Use Authorization (EUA). This EUA will remain in effect (meaning this test can be used) for the duration of the COVID-19 declaration under Section 564(b)(1) of the Act, 21 U.S.C. section 360bbb-3(b)(1), unless the authorization is terminated or revoked.  Performed at [facility], [place] [address] [place], [place] [address]         Radiology Studies: No results found.      Scheduled Meds: . Chlorhexidine Gluconate Cloth  6 each Topical Q0600  . cloNIDine  0.2 mg Transdermal Weekly  . darbepoetin (ARANESP) injection - DIALYSIS  25 mcg Intravenous Q Mon-HD  . feeding supplement (NEPRO CARB STEADY)  237 mL Oral BID BM  . Gerhardt's butt cream   Topical BID  . heparin  5,000 Units Subcutaneous Q8H  . insulin aspart  0-5 Units Subcutaneous QHS  . insulin aspart  0-6 Units Subcutaneous TID WC  . latanoprost  1 drop Both Eyes QHS  . letrozole  2.5 mg Oral Daily  . levETIRAcetam  500 mg Oral Q1500   And  . levETIRAcetam  250 mg Oral Once per day on Mon Wed Fri  . levothyroxine  112 mcg Oral QAC breakfast  . metoprolol succinate  50 mg Oral Daily  . multivitamin  1 tablet Oral QHS  . [START ON 05/06/2021] vancomycin  500 mg Intravenous Q M,W,F-HD   Continuous Infusions:   LOS: 6 days     Cordelia Poche, MD [facility] 05/04/2021, 2:08 PM  If 7PM-7AM, please contact night-coverage [email or web address]

## 2021-05-04 NOTE — Progress Notes (Signed)
Notified Dr. Sidney Ace that pt's HR on telemetry is in the 120s-130s. Pt is asymptomatic. Will continue to monitor pt. Racheal Patches RN

## 2021-05-05 LAB — GLUCOSE, CAPILLARY
Glucose-Capillary: 92 mg/dL (ref 70–99)
Glucose-Capillary: 138 mg/dL — ABNORMAL HIGH (ref 70–99)
Glucose-Capillary: 137 mg/dL — ABNORMAL HIGH (ref 70–99)
Glucose-Capillary: 225 mg/dL — ABNORMAL HIGH (ref 70–99)

## 2021-05-05 MED ORDER — CHLORHEXIDINE GLUCONATE CLOTH 2 % EX PADS
6.0000 | MEDICATED_PAD | Freq: Every day | CUTANEOUS | Status: DC
  Administered 2021-05-05 – 2021-05-16 (×8): 6 via TOPICAL

## 2021-05-05 NOTE — Progress Notes (Signed)
[facility] Progress Note   Subjective:   Pt was tachycardic with variable O2 sats during HD yesterday, however CXR clear and suspect probe/vascular issue rather than actual hypoxia. O2 sats stable on RA today. Received 250 cc NS bolus yesterday, HR improved. Pt reports feeling well, denies SOB, CP, palpitations, abdominal pain, and nausea.   Objective Vitals:   05/04/21 1804 05/04/21 2008 05/04/21 2038 05/05/21 0400  BP: 109/71 107/89  104/66  Pulse: 83 98 (!) 106 77  Resp: 16 19 20 20   Temp: 97.9 F (36.6 C) 98.1 F (36.7 C)  98.4 F (36.9 C)  TempSrc: Oral Axillary  Axillary  SpO2:  97% 94% 98%  Weight:      Height:       Physical Exam General: Well developed female, alert and in NAD Heart: RRR, no murmurs, rubs or gallops Lungs: CTA bilaterally without wheezing, rhonchi or rales Abdomen: Soft, non-distended, +BS Extremities: No edema b/l lower extremities Dialysis Access: R IJ [facility]  Additional Objective Labs: Basic Metabolic Panel: Recent Labs  Lab 04/30/21 0132 05/01/21 0058 05/02/21 0034 05/03/21 0048 05/04/21 0534  NA 136   < > 134* 133* 134*  K 3.7   < > 4.0 3.9 4.0  CL 97*   < > 97* 95* 96*  CO2 24   < > 26 28 26   GLUCOSE 88   < > 90 133* 93  BUN 18   < > 29* 9 22  CREATININE 3.78*   < > 5.48* 2.86* 4.77*  CALCIUM 7.7*   < > 8.6* 8.2* 9.0  PHOS 4.0  --   --   --   --    < > = values in this interval not displayed.   Liver Function Tests: Recent Labs  Lab 04/29/21 0316  AST 15  ALT 14  ALKPHOS 58  BILITOT 0.7  PROT 6.8  ALBUMIN 2.3*   No results for input(s): LIPASE, AMYLASE in the last 168 hours. CBC: Recent Labs  Lab 04/30/21 0132 05/01/21 0058 05/02/21 0034 05/03/21 0048 05/04/21 0534  WBC 11.6* 7.6 7.4 8.6 6.5  NEUTROABS 9.6* 5.1 4.9 6.4 4.2  HGB 9.4* 8.8* 8.8* 9.5* 9.6*  HCT 29.5* 26.9* 27.6* 29.4* 30.1*  MCV 90.5 90.0 90.5 89.4 91.2  PLT 396 269 288 229 264   Blood Culture    Component Value Date/Time   SDES  BLOOD BLOOD LEFT HAND 04/23/2021 1253   SPECREQUEST  04/23/2021 1253    BOTTLES DRAWN AEROBIC AND ANAEROBIC Blood Culture adequate volume   CULT  04/23/2021 1253    NO GROWTH 5 DAYS Performed at [facility], [address] [place], [place] [address]    REPTSTATUS 04/28/2021 FINAL 04/23/2021 1253    Cardiac Enzymes: No results for input(s): CKTOTAL, CKMB, CKMBINDEX, TROPONINI in the last 168 hours. CBG: Recent Labs  Lab 05/04/21 0737 05/04/21 1253 05/04/21 1851 05/04/21 2105 05/05/21 0741  GLUCAP 85 95 143* 121* 92   Iron Studies: No results for input(s): IRON, TIBC, TRANSFERRIN, FERRITIN in the last 72 hours. @lablastinr3 @ Studies/Results: DG CHEST PORT 1 VIEW  Result Date: 05/04/2021 CLINICAL DATA:  Hypoxia EXAM: PORTABLE CHEST 1 VIEW COMPARISON:  Apr 28, 2021, Apr 17, 2021 FINDINGS: Evaluation is limited secondary to patient positioning. Unchanged enlarged cardiomediastinal silhouette when accounting for patient rotation. RIGHT IJ CVC tip terminates over the RIGHT atrium. No pleural effusion or pneumothorax. Surgical clips project over the neck. No acute pleuroparenchymal abnormality. Unchanged elevation of the RIGHT hemidiaphragm.  Degenerative changes of the thoracic spine. IMPRESSION: No acute cardiopulmonary abnormality. Electronically Signed   By: Valentino Saxon MD   On: 05/04/2021 19:00   Medications: . vancomycin     . Chlorhexidine Gluconate Cloth  6 each Topical Q0600  . cloNIDine  0.2 mg Transdermal Weekly  . darbepoetin (ARANESP) injection - DIALYSIS  25 mcg Intravenous Q Mon-HD  . feeding supplement (NEPRO CARB STEADY)  237 mL Oral BID BM  . Gerhardt's butt cream   Topical BID  . heparin  5,000 Units Subcutaneous Q8H  . insulin aspart  0-5 Units Subcutaneous QHS  . insulin aspart  0-6 Units Subcutaneous TID WC  . latanoprost  1 drop Both Eyes QHS  . letrozole  2.5 mg Oral Daily  . levETIRAcetam  500 mg Oral Q1500   And  . levETIRAcetam  250 mg  Oral Once per day on Mon Wed Fri  . levothyroxine  112 mcg Oral QAC breakfast  . metoprolol succinate  50 mg Oral Daily  . multivitamin  1 tablet Oral QHS  . [START ON 05/06/2021] vancomycin  500 mg Intravenous Q M,W,F-HD    Assessment/Plan: 1. AMS- likely multifactorial. Underlying dementia, recent bacteremia and missed dialysis. Baseline unclear.More alert past few days and interacting appropriately 2. ESRD- Recent new start with MWF schedule but had not started at outpatient [facility] HD. Has not attempted HD in chair yet- will attempt tomorrow to make sure she can tolerate before discharge.  3. Hypertension/volume- BP at goal. Continue home meds and hold prior to dialysis. Does not appear grossly volume overloaded. UF as tolerated.  4. Streptococcal bacteremia - on Vanc until 6/27. Per primary 5. Anemiaof CKD- Hgb 9.6. Continue aranesp q Monday, will hold off on IV Fe given bacteremia.  6. Secondary Hyperparathyroidism -CCa slightly high, will use low calcium bath. Phos 4 5/26. Not on VDRA or binders.  7. Nutrition- Renal diet w/fluid restrictions 8. Seizure disorder - on Keppra 9. DMT2 - per primary   Anice Paganini, PA-C 05/05/2021, 10:41 AM  [facility] Pager: [phone]

## 2021-05-05 NOTE — Progress Notes (Signed)
PROGRESS NOTE    GRISEL BLUMENSTOCK  HAL:[id number] DOB: 02-24-1946 DOA: 04/28/2021 PCP: [place]   Brief Narrative: Kirsten Mcmahon is a 75 y.o. female with a history of streptococcal bacteremia currently on vancomycin IV,ESRD, GERD, hypertension, hypothyroidism, non-insulin-dependent diabetes type 2, hyperlipidemia, CVA history. Patient presented secondary to confusion with concern for uremia secondary to unintentionally missed HD from significant amount of pain at home. She was admitted and started on HD.   Assessment & Plan:   Principal Problem:   ESRD (end stage renal disease) ([place]) Active Problems:   Hypertension   Hypothyroidism   Type II diabetes mellitus with renal manifestations (HCC)   Streptococcal bacteremia   Pressure injury of skin   Physical deconditioning   Generalized muscle weakness   ESRD on HD Newly started on hemodialysis earlier this month. Nephrology consulted and are continuing HD while inpatient on a MWF schedule which has been complicated by mental status. HD today. -Nephrology recommendations for HD; patient needs to tolerate HD in a chair prior to discharge.  Confusion Unsure of etiology. On admission, however, concern was uremic encephalopathy. Difficult to know what her baseline is. Per nephrology, mental status has not really improved since admission. I tried contacting son, [facility], nephrology, previous hospitalist, and reviewing prior notes, but could not get an answer on recent baseline. No imaging performed, however she does not have any new focal deficits concerning for acute intracranial pathology. No fevers or leukocytosis to suggest possible persistent bacteremia and most recent blood cultures were clear from previous hospitalization. It is possible patient has underlying cognitive impairment as this was suggested as a possibility from prior admission and may be at her baseline. Possibly stable at this time. -I have asked  family (5/30) to assist nursing by being at bedside as much as possible to help orient Ms. Coughlin; next HD session 6/1  Streptococcus group G bacteremia This was diagnosed during previous hospitalization. At that time, Transesophageal Echocardiogram was recommended but not performed secondary to patient/family decline. Patient was discharged with recommendations for ampicillin IV until 06/01/2021; however, this appears to have changed to Vancomycin IV. On this admission, she was continued on Vancomycin IV.  Anemia of chronic kidney disease Stable. No evidence of hemorrhage.  Primary hypertension Patient is on metoprolol and clonidine as an outpatient -Continue Toprol XL 50 mg daily and clonidine patch 0.2 mg q week  Hypothyroidism -Continue Synthroid 112 mcg daily  Diabetes mellitus, type 2 Well controlled. Patient is on Januvia as an outpatient. Last hemoglobin A1C of 5.7. Started on SSI and and Tradjenta inpatient. Hypoglycemic episodes. -Continue SSI -Will hold Tradjenta   History of seizures -Continue Keppra 500 mg daily and Keppra 250 mg three times weekly  Pressure injury Mid buttocks, present on admission.   DVT prophylaxis: Heparin subq Code Status:   Code Status: Full Code Family Communication: None at bedside. Discussed with patient's PCP, Dr. Durenda Guthrie Disposition Plan: Discharge home with [facility] services likely in 1-3 days pending ability to dialyze siting in a chair   Consultants:   Nephrology  Procedures:   HEMODIALYSIS  Antimicrobials:  Vancomycin IV    Subjective: No issues overnight.   Objective: Vitals:   05/04/21 2008 05/04/21 2038 05/05/21 0400 05/05/21 1345  BP: 107/89  104/66   Pulse: 98 (!) 106 77   Resp: 19 20 20 20   Temp: 98.1 F (36.7 C)  98.4 F (36.9 C) (!) 97.4 F (36.3 C)  TempSrc: Axillary  Axillary Oral  SpO2: 97% 94% 98%   Weight:      Height:        Intake/Output Summary (Last 24 hours) at 05/05/2021 1530 Last  data filed at 05/04/2021 2209 Gross per 24 hour  Intake 250.27 ml  Output 1000 ml  Net -749.73 ml   Filed Weights   05/02/21 1245 05/04/21 1315 05/04/21 1720  Weight: 55 kg 56.3 kg 55.4 kg    Examination:  General exam: Appears calm and comfortable Respiratory system: Clear to auscultation. Respiratory effort normal. Cardiovascular system: S1 & S2 heard, RRR. No murmurs, rubs, gallops or clicks. Gastrointestinal system: Abdomen is nondistended, soft and nontender. No organomegaly or masses felt. Normal bowel sounds heard. Central nervous system: Somnolent. Arouses easily. Oriented to self only. No focal neurological deficits. Musculoskeletal: No edema. No calf tenderness Skin: No cyanosis. No rashes Psychiatry: Judgement and insight impaired. Memory impaired.  Data Reviewed: I have personally reviewed following labs and imaging studies  CBC Lab Results  Component Value Date   WBC 6.5 05/04/2021   RBC 3.30 (L) 05/04/2021   HGB 9.6 (L) 05/04/2021   HCT 30.1 (L) 05/04/2021   MCV 91.2 05/04/2021   MCH 29.1 05/04/2021   PLT 264 05/04/2021   MCHC 31.9 05/04/2021   RDW 14.1 05/04/2021   LYMPHSABS 1.3 05/04/2021   MONOABS 0.7 05/04/2021   EOSABS 0.2 05/04/2021   BASOSABS 0.1 06/04/1600     Last metabolic panel Lab Results  Component Value Date   NA 134 (L) 05/04/2021   K 4.0 05/04/2021   CL 96 (L) 05/04/2021   CO2 26 05/04/2021   BUN 22 05/04/2021   CREATININE 4.77 (H) 05/04/2021   GLUCOSE 93 05/04/2021   GFRNONAA 9 (L) 05/04/2021   GFRAA 34 (L) 08/01/2018   CALCIUM 9.0 05/04/2021   PHOS 4.0 04/30/2021   PROT 6.8 04/29/2021   ALBUMIN 2.3 (L) 04/29/2021   BILITOT 0.7 04/29/2021   ALKPHOS 58 04/29/2021   AST 15 04/29/2021   ALT 14 04/29/2021   ANIONGAP 12 05/04/2021    CBG (last 3)  Recent Labs    05/04/21 2105 05/05/21 0741 05/05/21 1134  GLUCAP 121* 92 138*     GFR: Estimated Creatinine Clearance: 7.9 mL/min (A) (by C-G formula based on SCr of 4.77  mg/dL (H)).  Coagulation Profile: Recent Labs  Lab 04/30/21 1533  INR 1.0    Recent Results (from the past 240 hour(s))  Resp Panel by RT-PCR (Flu A&B, Covid) Nasopharyngeal Swab     Status: None   Collection Time: 04/28/21  5:47 PM   Specimen: Nasopharyngeal Swab; Nasopharyngeal(NP) swabs in vial transport medium  Result Value Ref Range Status   SARS Coronavirus 2 by RT PCR NEGATIVE NEGATIVE Final    Comment: (NOTE) SARS-CoV-2 target nucleic acids are NOT DETECTED.  The SARS-CoV-2 RNA is generally detectable in upper respiratory specimens during the acute phase of infection. The lowest concentration of SARS-CoV-2 viral copies this assay can detect is 138 copies/mL. A negative result does not preclude SARS-Cov-2 infection and should not be used as the sole basis for treatment or other patient management decisions. A negative result may occur with  improper specimen collection/handling, submission of specimen other than nasopharyngeal swab, presence of viral mutation(s) within the areas targeted by this assay, and inadequate number of viral copies(<138 copies/mL). A negative result must be combined with clinical observations, patient history, and epidemiological information. The expected result is Negative.  Fact Sheet for Patients:  EntrepreneurPulse.com.au  Fact Sheet for  Healthcare Providers:  IncredibleEmployment.be  This test is no t yet approved or cleared by the [place] and  has been authorized for detection and/or diagnosis of SARS-CoV-2 by FDA under an Emergency Use Authorization (EUA). This EUA will remain  in effect (meaning this test can be used) for the duration of the COVID-19 declaration under Section 564(b)(1) of the Act, 21 U.S.C.section 360bbb-3(b)(1), unless the authorization is terminated  or revoked sooner.       Influenza A by PCR NEGATIVE NEGATIVE Final   Influenza B by PCR NEGATIVE NEGATIVE Final     Comment: (NOTE) The Xpert Xpress SARS-CoV-2/FLU/RSV plus assay is intended as an aid in the diagnosis of influenza from Nasopharyngeal swab specimens and should not be used as a sole basis for treatment. Nasal washings and aspirates are unacceptable for Xpert Xpress SARS-CoV-2/FLU/RSV testing.  Fact Sheet for Patients: EntrepreneurPulse.com.au  Fact Sheet for Healthcare Providers: IncredibleEmployment.be  This test is not yet approved or cleared by the [place] FDA and has been authorized for detection and/or diagnosis of SARS-CoV-2 by FDA under an Emergency Use Authorization (EUA). This EUA will remain in effect (meaning this test can be used) for the duration of the COVID-19 declaration under Section 564(b)(1) of the Act, 21 U.S.C. section 360bbb-3(b)(1), unless the authorization is terminated or revoked.  Performed at [facility], [place] [address] [place], [place] [address]         Radiology Studies: DG CHEST PORT 1 VIEW  Result Date: 05/04/2021 CLINICAL DATA:  Hypoxia EXAM: PORTABLE CHEST 1 VIEW COMPARISON:  Apr 28, 2021, Apr 17, 2021 FINDINGS: Evaluation is limited secondary to patient positioning. Unchanged enlarged cardiomediastinal silhouette when accounting for patient rotation. RIGHT IJ CVC tip terminates over the RIGHT atrium. No pleural effusion or pneumothorax. Surgical clips project over the neck. No acute pleuroparenchymal abnormality. Unchanged elevation of the RIGHT hemidiaphragm. Degenerative changes of the thoracic spine. IMPRESSION: No acute cardiopulmonary abnormality. Electronically Signed   By: Valentino Saxon MD   On: 05/04/2021 19:00        Scheduled Meds: . Chlorhexidine Gluconate Cloth  6 each Topical Q0600  . Chlorhexidine Gluconate Cloth  6 each Topical Q0600  . cloNIDine  0.2 mg Transdermal Weekly  . darbepoetin (ARANESP) injection - DIALYSIS  25 mcg Intravenous Q Mon-HD  . feeding supplement  (NEPRO CARB STEADY)  237 mL Oral BID BM  . Gerhardt's butt cream   Topical BID  . heparin  5,000 Units Subcutaneous Q8H  . insulin aspart  0-5 Units Subcutaneous QHS  . insulin aspart  0-6 Units Subcutaneous TID WC  . latanoprost  1 drop Both Eyes QHS  . letrozole  2.5 mg Oral Daily  . levETIRAcetam  500 mg Oral Q1500   And  . levETIRAcetam  250 mg Oral Once per day on Mon Wed Fri  . levothyroxine  112 mcg Oral QAC breakfast  . metoprolol succinate  50 mg Oral Daily  . multivitamin  1 tablet Oral QHS  . [START ON 05/06/2021] vancomycin  500 mg Intravenous Q M,W,F-HD   Continuous Infusions: . vancomycin       LOS: 7 days     Cordelia Poche, MD [facility] 05/05/2021, 3:30 PM  If 7PM-7AM, please contact night-coverage [email or web address]

## 2021-05-05 NOTE — Progress Notes (Signed)
Physical Therapy Treatment Patient Details Name: CLEMMIE BUELNA MRN: [id number] DOB: [birth year] Today's Date: 05/05/2021    History of Present Illness 75yo female admitted 04/28/21 from an HD center after refusing dialysis. Also thought to have acute metabolic encepthalopathy. PMH breast CA, CVA with L residual hemiplegia, seizures, HTN, R fronto-temporal craniotomy    PT Comments    Patient received in bed, pleasant today and agreeable to attempting therapy, but states "please don't hurt me, I don't like to be hurt". Made multiple attempts to get to EOB, however unable to really completely get to the side of the bed due to physical resistance and very strong posterior pushing by patient. Seems very anxious, also very confused. Difficult to find an angle of HOB that she is agreeable to.  Left in bed with all needs met, bed alarm active. Will continue efforts.   Follow Up Recommendations  Other (comment);Supervision/Assistance - 24 hour ([facility] participant. Would certainly recommend in home care from [facility]ACE if this is available)     [facility] bed;Wheelchair (measurements PT);Wheelchair cushion (measurements PT);Other (comment) (hoyer lift and pads)    Recommendations for Other Services       Precautions / Restrictions Precautions Precautions: Fall;Other (comment) Precaution Comments: R perm-cath; history of R fronto-temporal craniectomy with musculocutaneous flap over site Restrictions Weight Bearing Restrictions: No    Mobility  Bed Mobility Overal bed mobility: Needs Assistance Bed Mobility: Supine to Sit;Sit to Supine     Supine to sit: Total assist;+2 for physical assistance Sit to supine: Total assist;+2 for physical assistance   General bed mobility comments: only able to get halfway to EOB- very physically resistant and quite strong so not able to sit all the way up or pivot hips around to EOB    Transfers                 General  transfer comment: unable- may need hoyer lift  Ambulation/Gait             General Gait Details: unsafe/unable; non-ambulatory at baseline   Stairs             Wheelchair Mobility    Modified Rankin (Stroke Patients Only)       Balance Overall balance assessment: Needs assistance Sitting-balance support: Single extremity supported;Feet supported Sitting balance-[name] Scale: Poor Sitting balance - Comments: unable to get fully to EOB due to heavy posterior lean and physical resistance Postural control: Posterior lean     Standing balance comment: unable to assess                            Cognition Arousal/Alertness: Awake/alert Behavior During Therapy: Flat affect;Anxious (very emotionally labile) Overall Cognitive Status: No family/caregiver present to determine baseline cognitive functioning                                 General Comments: seems oriented to self only, able to follow simple cues very inconsistently. Very emotionally labile but not agitated today.      Exercises      General Comments        Pertinent Vitals/Pain Pain Assessment: Faces Faces Pain Scale: Hurts even more Pain Location: generalized Pain Descriptors / Indicators: Aching;Grimacing;Guarding Pain Intervention(s): Limited activity within patient's tolerance;Monitored during session    Home Living  Prior Function            PT Goals (current goals can now be found in the care plan section) Acute Rehab PT Goals PT Goal Formulation: Patient unable to participate in goal setting Time For Goal Achievement: 05/15/21 Potential to Achieve Goals: Fair Progress towards PT goals: Not progressing toward goals - comment (limited by cognition, participation)    Frequency    Min 2X/week      PT Plan Current plan remains appropriate    Co-evaluation              AM-PAC PT "6 Clicks" Mobility   Outcome  Measure  Help needed turning from your back to your side while in a flat bed without using bedrails?: Total Help needed moving from lying on your back to sitting on the side of a flat bed without using bedrails?: Total Help needed moving to and from a bed to a chair (including a wheelchair)?: Total Help needed standing up from a chair using your arms (e.g., wheelchair or bedside chair)?: Total Help needed to walk in hospital room?: Total Help needed climbing 3-5 steps with a railing? : Total 6 Click Score: 6    End of Session   Activity Tolerance: Patient limited by pain;Other (comment) (cognition) Patient left: in bed;with call bell/phone within reach;with bed alarm set Nurse Communication: Mobility status PT Visit Diagnosis: Muscle weakness (generalized) (M62.81);Difficulty in walking, not elsewhere classified (R26.2);Hemiplegia and hemiparesis;Other abnormalities of gait and mobility (R26.89) Hemiplegia - Right/Left: Left Hemiplegia - dominant/non-dominant: Non-dominant Hemiplegia - caused by: Cerebral infarction     Time: 0881-1031 PT Time Calculation (min) (ACUTE ONLY): 11 min  Charges:  $Therapeutic Activity: 8-22 mins                     Windell Norfolk, DPT, PN1   Supplemental Physical Therapist [place]    Pager [phone] Acute Rehab Office [phone]

## 2021-05-06 LAB — CBC
HCT: 28.4 % — ABNORMAL LOW (ref 36.0–46.0)
Hemoglobin: 9.1 g/dL — ABNORMAL LOW (ref 12.0–15.0)
MCH: 28.7 pg (ref 26.0–34.0)
MCHC: 32 g/dL (ref 30.0–36.0)
MCV: 89.6 fL (ref 80.0–100.0)
Platelets: 198 10*3/uL (ref 150–400)
RBC: 3.17 MIL/uL — ABNORMAL LOW (ref 3.87–5.11)
RDW: 13.8 % (ref 11.5–15.5)
WBC: 10.2 10*3/uL (ref 4.0–10.5)
nRBC: 0 % (ref 0.0–0.2)

## 2021-05-06 LAB — BASIC METABOLIC PANEL
Anion gap: 13 (ref 5–15)
BUN: 28 mg/dL — ABNORMAL HIGH (ref 8–23)
CO2: 24 mmol/L (ref 22–32)
Calcium: 9.1 mg/dL (ref 8.9–10.3)
Chloride: 94 mmol/L — ABNORMAL LOW (ref 98–111)
Creatinine, Ser: 4.76 mg/dL — ABNORMAL HIGH (ref 0.44–1.00)
GFR, Estimated: 9 mL/min — ABNORMAL LOW (ref >60)
Glucose, Bld: 137 mg/dL — ABNORMAL HIGH (ref 70–99)
Potassium: 4.3 mmol/L (ref 3.5–5.1)
Sodium: 131 mmol/L — ABNORMAL LOW (ref 135–145)

## 2021-05-06 LAB — GLUCOSE, CAPILLARY
Glucose-Capillary: 121 mg/dL — ABNORMAL HIGH (ref 70–99)
Glucose-Capillary: 180 mg/dL — ABNORMAL HIGH (ref 70–99)
Glucose-Capillary: 123 mg/dL — ABNORMAL HIGH (ref 70–99)
Glucose-Capillary: 95 mg/dL (ref 70–99)

## 2021-05-06 NOTE — Progress Notes (Signed)
[facility] Progress Note   Subjective:   Pt seen in room, reports "I feel good." Denies any SOB, CP, dizziness, abdominal pain or nausea. BP soft overnight and pt does appear sleepy today.   Objective Vitals:   05/05/21 2104 05/06/21 0433 05/06/21 0503 05/06/21 0836  BP: 103/68 (!) 90/49 91/66 (!) 89/66  Pulse: 99 100    Resp: 20 20    Temp: 98.2 F (36.8 C) 98.2 F (36.8 C)    TempSrc: Oral Oral    SpO2: 94% 100%    Weight:      Height:       Physical Exam General:Well developed female, sleeping but awakens to voice, in NAD Heart:RRR, no murmurs, rubs or gallops Lungs:CTA bilaterally without wheezing, rhonchi or rales Abdomen:Soft, non-distended, +BS Extremities:No edema b/l lower extremities Dialysis Access:R IJ [facility]  Additional Objective Labs: Basic Metabolic Panel: Recent Labs  Lab 04/30/21 0132 05/01/21 0058 05/03/21 0048 05/04/21 0534 05/06/21 0117  NA 136   < > 133* 134* 131*  K 3.7   < > 3.9 4.0 4.3  CL 97*   < > 95* 96* 94*  CO2 24   < > 28 26 24   GLUCOSE 88   < > 133* 93 137*  BUN 18   < > 9 22 28*  CREATININE 3.78*   < > 2.86* 4.77* 4.76*  CALCIUM 7.7*   < > 8.2* 9.0 9.1  PHOS 4.0  --   --   --   --    < > = values in this interval not displayed.   Liver Function Tests: No results for input(s): AST, ALT, ALKPHOS, BILITOT, PROT, ALBUMIN in the last 168 hours. No results for input(s): LIPASE, AMYLASE in the last 168 hours. CBC: Recent Labs  Lab 05/01/21 0058 05/02/21 0034 05/03/21 0048 05/04/21 0534 05/06/21 0117  WBC 7.6 7.4 8.6 6.5 10.2  NEUTROABS 5.1 4.9 6.4 4.2  --   HGB 8.8* 8.8* 9.5* 9.6* 9.1*  HCT 26.9* 27.6* 29.4* 30.1* 28.4*  MCV 90.0 90.5 89.4 91.2 89.6  PLT 269 288 229 264 198   Blood Culture    Component Value Date/Time   SDES BLOOD BLOOD LEFT HAND 04/23/2021 1253   SPECREQUEST  04/23/2021 1253    BOTTLES DRAWN AEROBIC AND ANAEROBIC Blood Culture adequate volume   CULT  04/23/2021 1253    NO GROWTH 5  DAYS Performed at [facility], [address] [place], [place] [address]    REPTSTATUS 04/28/2021 FINAL 04/23/2021 1253   CBG: Recent Labs  Lab 05/05/21 0741 05/05/21 1134 05/05/21 1724 05/05/21 2103 05/06/21 0732  GLUCAP 92 138* 137* 225* 121*    Studies/Results: DG CHEST PORT 1 VIEW  Result Date: 05/04/2021 CLINICAL DATA:  Hypoxia EXAM: PORTABLE CHEST 1 VIEW COMPARISON:  Apr 28, 2021, Apr 17, 2021 FINDINGS: Evaluation is limited secondary to patient positioning. Unchanged enlarged cardiomediastinal silhouette when accounting for patient rotation. RIGHT IJ CVC tip terminates over the RIGHT atrium. No pleural effusion or pneumothorax. Surgical clips project over the neck. No acute pleuroparenchymal abnormality. Unchanged elevation of the RIGHT hemidiaphragm. Degenerative changes of the thoracic spine. IMPRESSION: No acute cardiopulmonary abnormality. Electronically Signed   By: Valentino Saxon MD   On: 05/04/2021 19:00   Medications: . vancomycin     . Chlorhexidine Gluconate Cloth  6 each Topical Q0600  . Chlorhexidine Gluconate Cloth  6 each Topical Q0600  . cloNIDine  0.2 mg Transdermal Weekly  . darbepoetin (ARANESP) injection -  DIALYSIS  25 mcg Intravenous Q Mon-HD  . feeding supplement (NEPRO CARB STEADY)  237 mL Oral BID BM  . heparin  5,000 Units Subcutaneous Q8H  . insulin aspart  0-5 Units Subcutaneous QHS  . insulin aspart  0-6 Units Subcutaneous TID WC  . latanoprost  1 drop Both Eyes QHS  . letrozole  2.5 mg Oral Daily  . levETIRAcetam  500 mg Oral Q1500   And  . levETIRAcetam  250 mg Oral Once per day on Mon Wed Fri  . levothyroxine  112 mcg Oral QAC breakfast  . metoprolol succinate  50 mg Oral Daily  . multivitamin  1 tablet Oral QHS  . vancomycin  500 mg Intravenous Q M,W,F-HD    Assessment/Plan: 1. AMS- likely multifactorial. Underlying dementia, recent bacteremia and missed dialysis. Baseline unclear.More alert pastfewdays and  interacting appropriately 2. ESRD- Recent new start with MWF schedule but had not started at outpatient [facility]. Has not attempted HD in chair yet- will attempt tomorrow to make sure she can tolerate before discharge.  3. Hypertension/volume- BPsoft overnight.  BP meds on hold this AM. Does not appear grossly volume overloaded and weight is stable. Minimal UF today with HD as tolerated.  4. Streptococcal bacteremia - on Vanc until 6/27. Per primary 5. Anemiaof CKD- Hgb9.6. Continue aranesp q Monday, will hold off on IV Fe given bacteremia. 6. Secondary Hyperparathyroidism -CCaslightly high, will use low calcium bath. Phos 4 5/26. Not on VDRA or binders.  7. Nutrition- Renal diet w/fluid restrictions 8. Seizure disorder - on Keppra 9. DMT2 - per primary   Anice Paganini, PA-C 05/06/2021, 10:12 AM  [facility] Pager: [phone]

## 2021-05-06 NOTE — TOC Progression Note (Signed)
Transition of Care [facility]) - Progression Note    Patient Details  Name: Kirsten Mcmahon MRN: [id number] Date of Birth: 1946/02/12  Transition of Care [facility]) CM/SW [place], LCSW Phone Number: 05/06/2021, 9:09 AM  Clinical Narrative:    CSW spoke with [facility] [place]) to find out what services patient is eligible for (and what kind of Medicare she reverts back to) if they are no longer active. Thayer Headings reported that she believes that they have decided to maintain services with the patient since she is hospitalized but she will confirm with patient's social worker when she is back in the office.    Expected Discharge Plan: [place] Barriers to Discharge: Continued Medical Work up  Expected Discharge Plan and Services Expected Discharge Plan: [place]   Discharge Planning Services: CM Consult   Living arrangements for the past 2 months: Single Family Home                                       Social Determinants of Health (SDOH) Interventions    Readmission Risk Interventions Readmission Risk Prevention Plan 04/11/2021  Transportation Screening Complete  PCP or Specialist Appt within 3-5 Days Complete  HRI or [place] Complete  Social Work Consult for [place] Planning/Counseling Complete  Palliative Care Screening Not Applicable  Medication Review Press photographer) Complete  Some recent data might be hidden

## 2021-05-06 NOTE — Progress Notes (Signed)
PROGRESS NOTE    Kirsten Mcmahon  YWV:[id number] DOB: 1946/07/08 DOA: 04/28/2021 PCP: [facility]   Chief Complaint  Patient presents with  . Altered Mental Status   Brief Narrative:  Kirsten Mcmahon is Kirsten Mcmahon 75 y.o. female with [name] history of streptococcal bacteremia currently on vancomycin IV,ESRD, GERD, hypertension, hypothyroidism, non-insulin-dependent diabetes type 2, hyperlipidemia, CVA history. Patient presented secondary to confusion with concern for uremia secondary to unintentionally missed HD from significant amount of pain at home. She was admitted and started on HD.  Assessment & Plan:   Principal Problem:   ESRD (end stage renal disease) ([place]) Active Problems:   Hypertension   Hypothyroidism   Type II diabetes mellitus with renal manifestations (HCC)   Streptococcal bacteremia   Pressure injury of skin   Physical deconditioning   Generalized muscle weakness  ESRD on HD Newly started on hemodialysis earlier this month. Nephrology consulted and are continuing HD while inpatient on [facility] MWF schedule which has been complicated by mental status. HD today. -Nephrology recommendations for HD; patient needs to tolerate HD in Maude[facility]mann chair prior to discharge.  Confusion Unsure of etiology. On admission, however, concern was uremic encephalopathy. Difficult to know what her baseline is. Per nephrology, mental status has not really improved since admission. Discussed with daughter, she notes before fall ([place] couple of months ago) was able to get to bathroom with walker/cane, sit in chair.  Needed some assistance with dressing.  Her confusion has been worse than normal since then as well.  Head CT 5/10 with chronic cortical/subcortical infarct within the R frontal operculum and R insula with overlying craniotomy defect.  Associated wallerian degeneration of the R brainstem.  Redemonstrated chronic lacunar infarcts within the deep gray nuclei bilaterally.  Stable background  severe cerebral white matter chronic small vessel ischemic disease.  Redemonstrated chronic infarcts within the bilateral cerebellar hemispheres.   No fevers or leukocytosis to suggest possible persistent bacteremia and most recent blood cultures were clear from previous hospitalization. It is possible patient has underlying cognitive impairment as this was suggested as Kirsten Mcmahon possibility from prior admission and may be at her baseline. Possibly stable at this time. Delirium precautions Will continue to discuss with family to assess how close she is to baseline  Streptococcus group G bacteremia This was diagnosed during previous hospitalization. At that time, Transesophageal Echocardiogram was recommended but not performed secondary to patient/family decline. Patient was discharged with recommendations for ampicillin IV until 06/01/2021; however, this appears to have changed to Vancomycin IV. On this admission, she was continued on Vancomycin IV.  Anemia of chronic kidney disease Stable. No evidence of hemorrhage.  Primary hypertension Patient is on metoprolol and clonidine as an outpatient BP soft today, hold metoprolol and clonidine patch  -Continue Toprol XL 50 mg daily and clonidine patch 0.2 mg q week  Hypothyroidism -Continue Synthroid 112 mcg daily  Diabetes mellitus, type 2 Well controlled. Patient is on Januvia as an outpatient. Last hemoglobin A1C of 5.7. Started on SSI and and Tradjenta inpatient. Hypoglycemic episodes. -Continue SSI -Will hold Tradjenta   History of seizures -Continue Keppra 500 mg daily and Keppra 250 mg three times weekly  Pressure injury Mid buttocks, present on admission.  DVT prophylaxis: heparin  Code Status: full  Family Communication: none at bedside Disposition:   Status is: Inpatient  Remains inpatient appropriate because:Inpatient level of care appropriate due to severity of illness   Dispo: The patient is from: SNF  Anticipated d/c is to: SNF              Patient currently is not medically stable to d/c.   Difficult to place patient No       Consultants:   nephrology  Procedures:   none  Antimicrobials: Anti-infectives (From admission, onward)   Start     Dose/Rate Route Frequency Ordered Stop   05/06/21 1200  vancomycin (VANCOCIN) IVPB 500 mg/100 ml premix        500 mg 100 mL/hr over 60 Minutes Intravenous Every M-W-F (Hemodialysis) 04/30/21 0727 06/01/21 2359   05/04/21 1800  vancomycin (VANCOREADY) IVPB 500 mg/100 mL        500 mg 100 mL/hr over 60 Minutes Intravenous  Once 05/04/21 1524     05/04/21 1609  vancomycin (VANCOCIN) 500-5 MG/100ML-% IVPB       Note to Pharmacy: [name]  : cabinet override      05/04/21 1609 05/04/21 1613   05/01/21 1200  vancomycin (VANCOCIN) IVPB 500 mg/100 ml premix  Status:  Discontinued       Note to Pharmacy: For this week only she is going to receive hd on 5/26 and 5/28 so please give the Vancomycin. FOr next week back to mon wed fri   500 mg 100 mL/hr over 60 Minutes Intravenous Every M-W-F (Hemodialysis) 04/30/21 0723 04/30/21 0726   04/30/21 1800  vancomycin (VANCOCIN) IVPB 500 mg/100 ml premix       Note to Pharmacy: For this week only she is going to receive hd on 5/26 and 5/28 so please give the Vancomycin. FOr next week back to mon wed fri   500 mg 100 mL/hr over 60 Minutes Intravenous Every T-Th-Sa (1800) 04/30/21 0727 05/02/21 1839   04/29/21 1539  vancomycin (VANCOCIN) 500-5 MG/100ML-% IVPB       Note to Pharmacy: [name]  : cabinet override      04/29/21 1539 04/29/21 1727   04/29/21 1200  vancomycin (VANCOREADY) IVPB 500 mg/100 mL  Status:  Discontinued        500 mg 100 mL/hr over 60 Minutes Intravenous Every M-W-F (Hemodialysis) 04/28/21 1812 04/30/21 0723         Subjective: Confused  Objective: Vitals:   05/06/21 0433 05/06/21 0503 05/06/21 0836 05/06/21 1431  BP: (!) 90/49 91/66 (!) 89/66 93/63   Pulse: 100   95  Resp: 20   14  Temp: 98.2 F (36.8 C)   98.3 F (36.8 C)  TempSrc: Oral   Oral  SpO2: 100%   100%  Weight:      Height:        Intake/Output Summary (Last 24 hours) at 05/06/2021 1848 Last data filed at 05/06/2021 0836 Gross per 24 hour  Intake 200 ml  Output --  Net 200 ml   Filed Weights   05/02/21 1245 05/04/21 1315 05/04/21 1720  Weight: 55 kg 56.3 kg 55.4 kg    Examination:  General exam: Appears calm and comfortable  Respiratory system: Clear to auscultation. Respiratory effort normal. Cardiovascular system: S1 & S2 heard, RRR.  Gastrointestinal system: Abdomen is nondistended, soft and nontender.  Central nervous system: Alert and oriented x1. L sided weakness Extremities: no Mckaylie  Data Reviewed: I have personally reviewed following labs and imaging studies  CBC: Recent Labs  Lab 04/30/21 0132 05/01/21 0058 05/02/21 0034 05/03/21 0048 05/04/21 0534 05/06/21 0117  WBC 11.6* 7.6 7.4 8.6 6.5 10.2  NEUTROABS 9.6* 5.1 4.9 6.4 4.2  --  HGB 9.4* 8.8* 8.8* 9.5* 9.6* 9.1*  HCT 29.5* 26.9* 27.6* 29.4* 30.1* 28.4*  MCV 90.5 90.0 90.5 89.4 91.2 89.6  PLT 396 269 288 229 264 161    Basic Metabolic Panel: Recent Labs  Lab 04/30/21 0132 05/01/21 0058 05/02/21 0034 05/03/21 0048 05/04/21 0534 05/06/21 0117  NA 136 134* 134* 133* 134* 131*  K 3.7 4.0 4.0 3.9 4.0 4.3  CL 97* 98 97* 95* 96* 94*  CO2 24 27 26 28 26 24   GLUCOSE 88 96 90 133* 93 137*  BUN 18 18 29* 9 22 28*  CREATININE 3.78* 3.54* 5.48* 2.86* 4.77* 4.76*  CALCIUM 7.7* 8.0* 8.6* 8.2* 9.0 9.1  MG 2.0 2.0 2.2 2.0 2.2  --   PHOS 4.0  --   --   --   --   --     GFR: Estimated Creatinine Clearance: 8 mL/min (Cheril Slattery) (by C-G formula based on SCr of 4.76 mg/dL (H)).  Liver Function Tests: No results for input(s): AST, ALT, ALKPHOS, BILITOT, PROT, ALBUMIN in the last 168 hours.  CBG: Recent Labs  Lab 05/05/21 1724 05/05/21 2103 05/06/21 0732 05/06/21 1218 05/06/21 1702  GLUCAP  137* 225* 121* 180* 123*     Recent Results (from the past 240 hour(s))  Resp Panel by RT-PCR (Flu Lavon Bothwell&B, Covid) Nasopharyngeal Swab     Status: None   Collection Time: 04/28/21  5:47 PM   Specimen: Nasopharyngeal Swab; Nasopharyngeal(NP) swabs in vial transport medium  Result Value Ref Range Status   SARS Coronavirus 2 by RT PCR NEGATIVE NEGATIVE Final    Comment: (NOTE) SARS-CoV-2 target nucleic acids are NOT DETECTED.  The SARS-CoV-2 RNA is generally detectable in upper respiratory specimens during the acute phase of infection. The lowest concentration of SARS-CoV-2 viral copies this assay can detect is 138 copies/mL. Galdino Hinchman negative result does not preclude SARS-Cov-2 infection and should not be used as the sole basis for treatment or other patient management decisions. Vada Yellen negative result may occur with  improper specimen collection/handling, submission of specimen other than nasopharyngeal swab, presence of viral mutation(s) within the areas targeted by this assay, and inadequate number of viral copies(<138 copies/mL). Susen Haskew negative result must be combined with clinical observations, patient history, and epidemiological information. The expected result is Negative.  Fact Sheet for Patients:  EntrepreneurPulse.com.au  Fact Sheet for Healthcare Providers:  IncredibleEmployment.be  This test is no t yet approved or cleared by the [place] FDA and  has been authorized for detection and/or diagnosis of SARS-CoV-2 by FDA under an Emergency Use Authorization (EUA). This EUA will remain  in effect (meaning this test can be used) for the duration of the COVID-19 declaration under Section 564(b)(1) of the Act, 21 U.S.C.section 360bbb-3(b)(1), unless the authorization is terminated  or revoked sooner.       Influenza Maely Clements by PCR NEGATIVE NEGATIVE Final   Influenza B by PCR NEGATIVE NEGATIVE Final    Comment: (NOTE) The Xpert Xpress SARS-CoV-2/FLU/RSV  plus assay is intended as an aid in the diagnosis of influenza from Nasopharyngeal swab specimens and should not be used as Janki Dike sole basis for treatment. Nasal washings and aspirates are unacceptable for Xpert Xpress SARS-CoV-2/FLU/RSV testing.  Fact Sheet for Patients: EntrepreneurPulse.com.au  Fact Sheet for Healthcare Providers: IncredibleEmployment.be  This test is not yet approved or cleared by the [place] FDA and has been authorized for detection and/or diagnosis of SARS-CoV-2 by FDA under an Emergency Use Authorization (EUA). This EUA will remain in effect (  meaning this test can be used) for the duration of the COVID-19 declaration under Section 564(b)(1) of the Act, 21 U.S.C. section 360bbb-3(b)(1), unless the authorization is terminated or revoked.  Performed at [facility], [place] [address] [place], [place] [address]          Radiology Studies: DG CHEST PORT 1 VIEW  Result Date: 05/04/2021 CLINICAL DATA:  Hypoxia EXAM: PORTABLE CHEST 1 VIEW COMPARISON:  Apr 28, 2021, Apr 17, 2021 FINDINGS: Evaluation is limited secondary to patient positioning. Unchanged enlarged cardiomediastinal silhouette when accounting for patient rotation. RIGHT IJ CVC tip terminates over the RIGHT atrium. No pleural effusion or pneumothorax. Surgical clips project over the neck. No acute pleuroparenchymal abnormality. Unchanged elevation of the RIGHT hemidiaphragm. Degenerative changes of the thoracic spine. IMPRESSION: No acute cardiopulmonary abnormality. Electronically Signed   By: Valentino Saxon MD   On: 05/04/2021 19:00        Scheduled Meds: . Chlorhexidine Gluconate Cloth  6 each Topical Q0600  . cloNIDine  0.2 mg Transdermal Weekly  . darbepoetin (ARANESP) injection - DIALYSIS  25 mcg Intravenous Q Mon-HD  . feeding supplement (NEPRO CARB STEADY)  237 mL Oral BID BM  . heparin  5,000 Units Subcutaneous Q8H  . insulin aspart  0-5  Units Subcutaneous QHS  . insulin aspart  0-6 Units Subcutaneous TID WC  . latanoprost  1 drop Both Eyes QHS  . letrozole  2.5 mg Oral Daily  . levETIRAcetam  500 mg Oral Q1500   And  . levETIRAcetam  250 mg Oral Once per day on Mon Wed Fri  . levothyroxine  112 mcg Oral QAC breakfast  . metoprolol succinate  50 mg Oral Daily  . multivitamin  1 tablet Oral QHS  . vancomycin  500 mg Intravenous Q M,W,F-HD   Continuous Infusions: . vancomycin       LOS: 8 days    Time spent: over 30 min    Fayrene Helper, MD [facility]   To contact the attending provider between 7A-7P or the covering provider during after hours 7P-7A, please log into the web site [email or web address] and access using universal [place] password for that web site. If you do not have the password, please call the hospital operator.  05/06/2021, 6:48 PM

## 2021-05-06 NOTE — Progress Notes (Signed)
Dialysis treatment rescheduled for Thursday, June 2 per Dr. Jonnie Finner.  Primary RN, Rama Jallow made aware.

## 2021-05-07 ENCOUNTER — Inpatient Hospital Stay (HOSPITAL_COMMUNITY): Payer: Medicare (Managed Care)

## 2021-05-07 DIAGNOSIS — G9349 Other encephalopathy: Secondary | ICD-10-CM

## 2021-05-07 DIAGNOSIS — Z515 Encounter for palliative care: Secondary | ICD-10-CM

## 2021-05-07 DIAGNOSIS — Z7189 Other specified counseling: Secondary | ICD-10-CM

## 2021-05-07 DIAGNOSIS — N19 Unspecified kidney failure: Secondary | ICD-10-CM

## 2021-05-07 LAB — COMPREHENSIVE METABOLIC PANEL
ALT: 14 U/L (ref 0–44)
AST: 26 U/L (ref 15–41)
Albumin: 2.4 g/dL — ABNORMAL LOW (ref 3.5–5.0)
Alkaline Phosphatase: 60 U/L (ref 38–126)
Anion gap: 12 (ref 5–15)
BUN: 42 mg/dL — ABNORMAL HIGH (ref 8–23)
CO2: 26 mmol/L (ref 22–32)
Calcium: 8.6 mg/dL — ABNORMAL LOW (ref 8.9–10.3)
Chloride: 92 mmol/L — ABNORMAL LOW (ref 98–111)
Creatinine, Ser: 6.3 mg/dL — ABNORMAL HIGH (ref 0.44–1.00)
GFR, Estimated: 6 mL/min — ABNORMAL LOW (ref >60)
Glucose, Bld: 121 mg/dL — ABNORMAL HIGH (ref 70–99)
Potassium: 4.8 mmol/L (ref 3.5–5.1)
Sodium: 130 mmol/L — ABNORMAL LOW (ref 135–145)
Total Bilirubin: 1.1 mg/dL (ref 0.3–1.2)
Total Protein: 6.4 g/dL — ABNORMAL LOW (ref 6.5–8.1)

## 2021-05-07 LAB — CBC WITH DIFFERENTIAL/PLATELET
Abs Immature Granulocytes: 0.04 10*3/uL (ref 0.00–0.07)
Basophils Absolute: 0 10*3/uL (ref 0.0–0.1)
Basophils Relative: 0 %
Eosinophils Absolute: 0.3 10*3/uL (ref 0.0–0.5)
Eosinophils Relative: 3 %
HCT: 25.2 % — ABNORMAL LOW (ref 36.0–46.0)
Hemoglobin: 7.9 g/dL — ABNORMAL LOW (ref 12.0–15.0)
Immature Granulocytes: 0 %
Lymphocytes Relative: 20 %
Lymphs Abs: 2 10*3/uL (ref 0.7–4.0)
MCH: 28.6 pg (ref 26.0–34.0)
MCHC: 31.3 g/dL (ref 30.0–36.0)
MCV: 91.3 fL (ref 80.0–100.0)
Monocytes Absolute: 0.8 10*3/uL (ref 0.1–1.0)
Monocytes Relative: 8 %
Neutro Abs: 7 10*3/uL (ref 1.7–7.7)
Neutrophils Relative %: 69 %
Platelets: 197 10*3/uL (ref 150–400)
RBC: 2.76 MIL/uL — ABNORMAL LOW (ref 3.87–5.11)
RDW: 14.1 % (ref 11.5–15.5)
WBC: 10.1 10*3/uL (ref 4.0–10.5)
nRBC: 0 % (ref 0.0–0.2)

## 2021-05-07 LAB — VITAMIN B12: Vitamin B-12: 532 pg/mL (ref 180–914)

## 2021-05-07 LAB — FOLATE: Folate: 26.1 ng/mL (ref >5.9)

## 2021-05-07 LAB — BLOOD GAS, VENOUS
Acid-Base Excess: 3.9 mmol/L — ABNORMAL HIGH (ref 0.0–2.0)
Bicarbonate: 28.4 mmol/L — ABNORMAL HIGH (ref 20.0–28.0)
Drawn by: 5979
FIO2: 21
O2 Saturation: 34.6 %
Patient temperature: 37
pCO2, Ven: 47.2 mmHg (ref 44.0–60.0)
pH, Ven: 7.397 (ref 7.250–7.430)
pO2, Ven: <31 mmHg — CL (ref 32.0–45.0)

## 2021-05-07 LAB — PHOSPHORUS: Phosphorus: 2.7 mg/dL (ref 2.5–4.6)

## 2021-05-07 LAB — GLUCOSE, CAPILLARY
Glucose-Capillary: 86 mg/dL (ref 70–99)
Glucose-Capillary: 107 mg/dL — ABNORMAL HIGH (ref 70–99)
Glucose-Capillary: 92 mg/dL (ref 70–99)

## 2021-05-07 LAB — AMMONIA: Ammonia: 26 umol/L (ref 9–35)

## 2021-05-07 LAB — MAGNESIUM: Magnesium: 2.1 mg/dL (ref 1.7–2.4)

## 2021-05-07 IMAGING — CT CT HEAD W/O CM
2 series · 14 of 30 positions shown, 16 images · non-contrast
Comparison: None.

CLINICAL DATA: Encephalopathy

EXAM:
CT HEAD WITHOUT CONTRAST
TECHNIQUE: Contiguous axial images were obtained from the base of the skull
through the vertex without intravenous contrast.

[Series 3: head wo · axial · 0.33mm/px · z∈[+1293,+1412]mm · 7 of 32 slices shown, 9 images]
[im 4/32  brain]
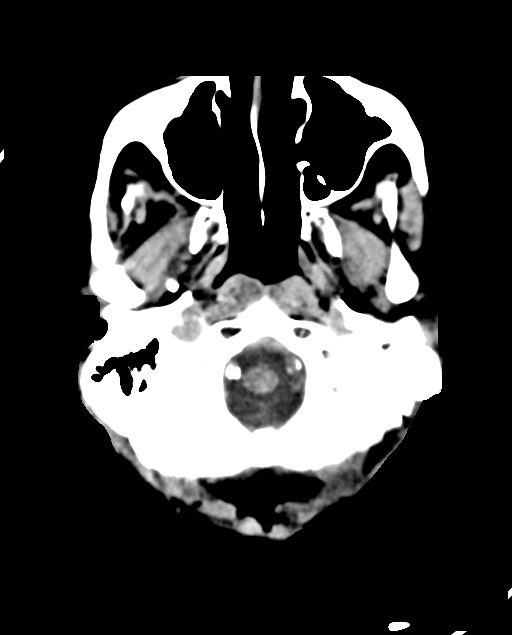
[im 4/32  bone]
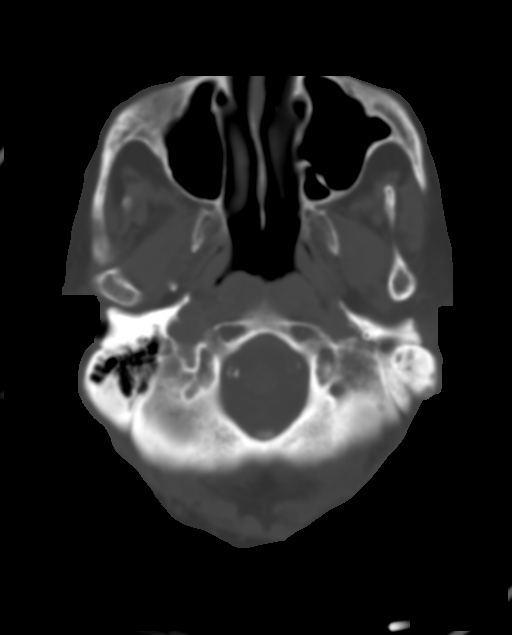
[im 8/32  brain]
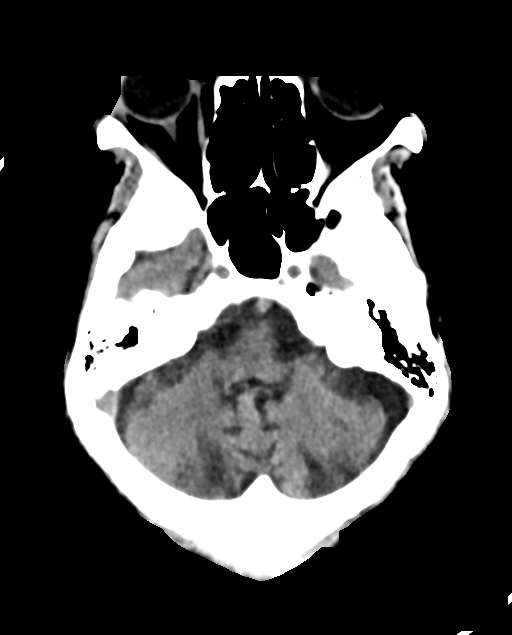
[im 12/32  brain]
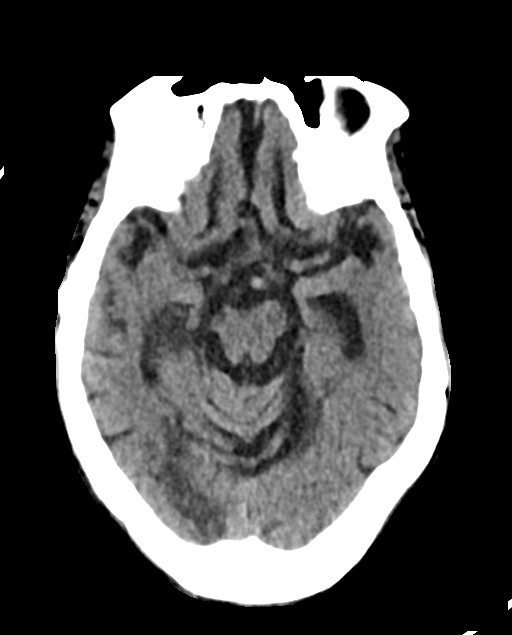
[im 16/32  brain]
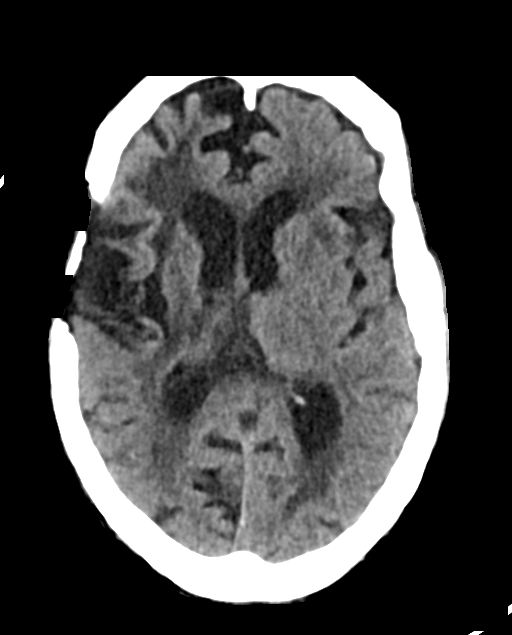
[im 20/32  brain]
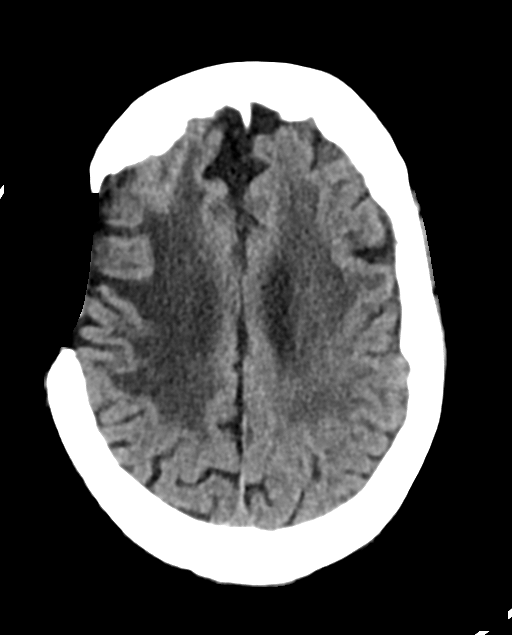
[im 20/32  bone]
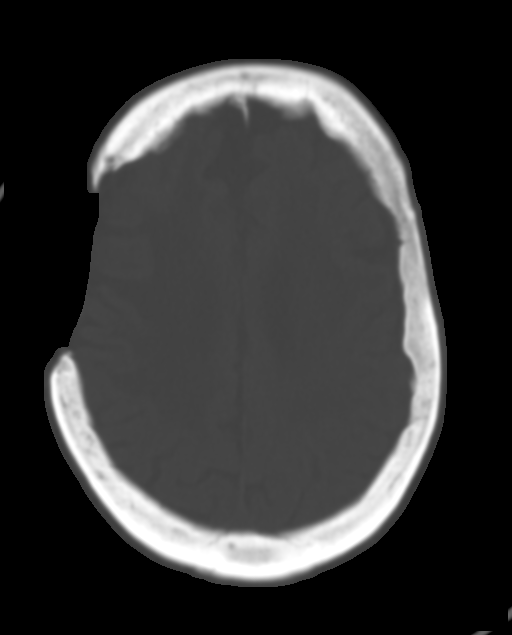
[im 24/32  brain]
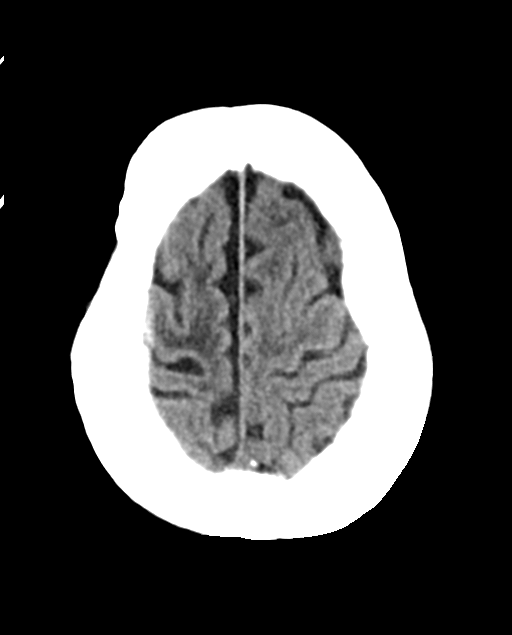
[im 28/32  brain]
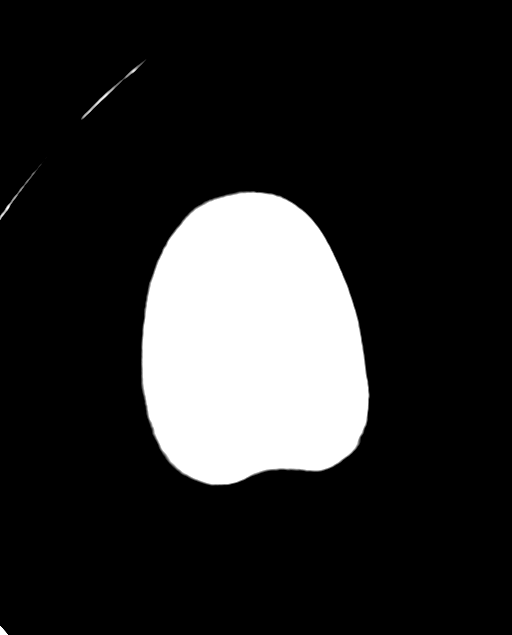

[Series 7: head bone · axial · 0.33mm/px · z∈[+1287,+1398]mm · 7 of 80 slices shown]
[im 8/80  bone]
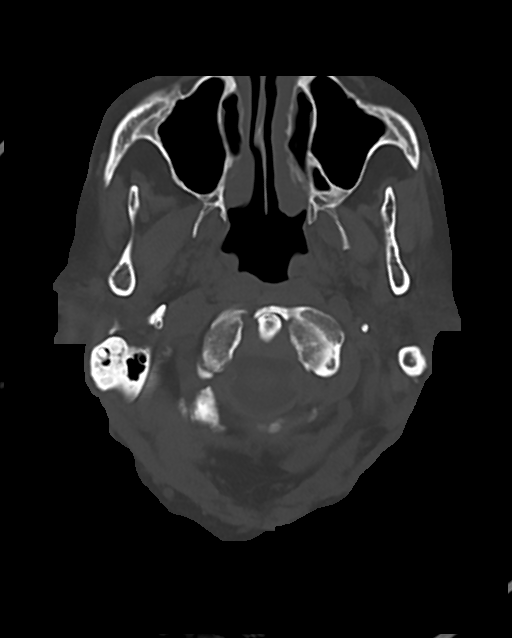
[im 16/80  bone]
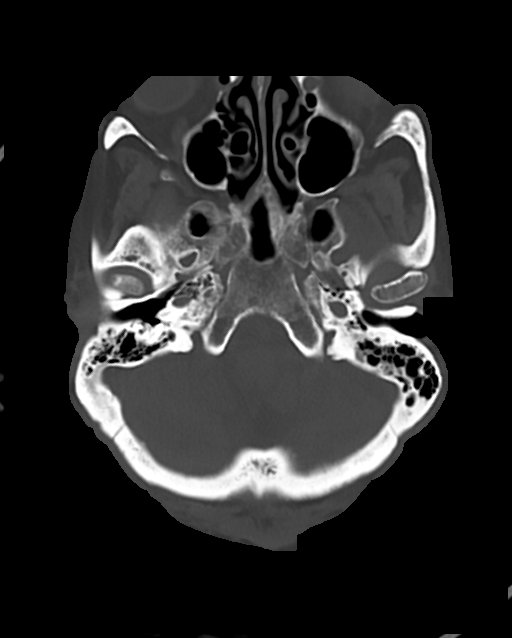
[im 24/80  bone]
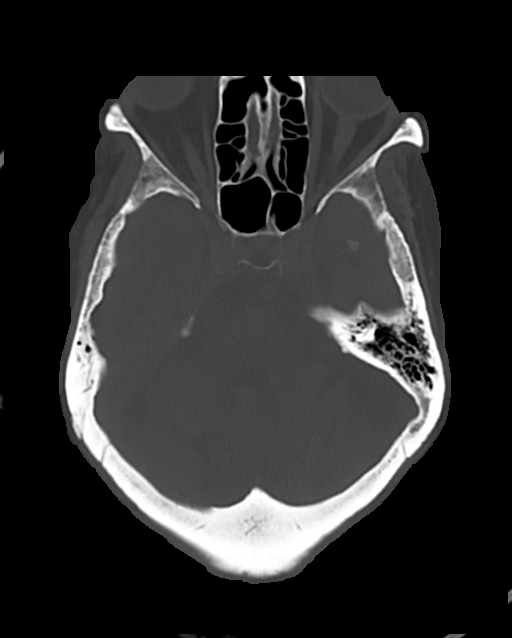
[im 36/80  bone]
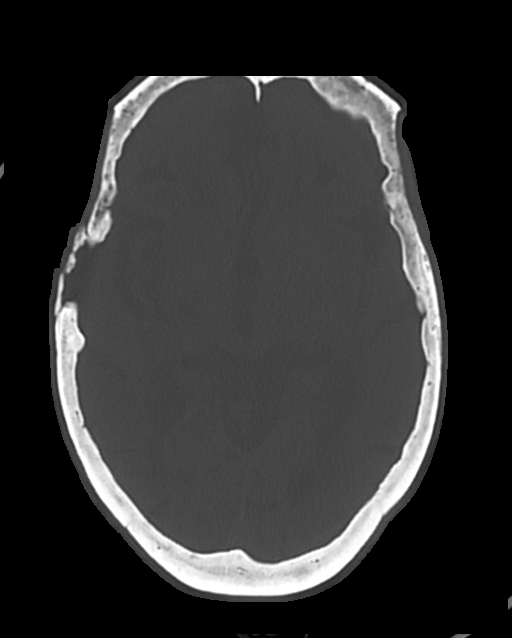
[im 44/80  bone]
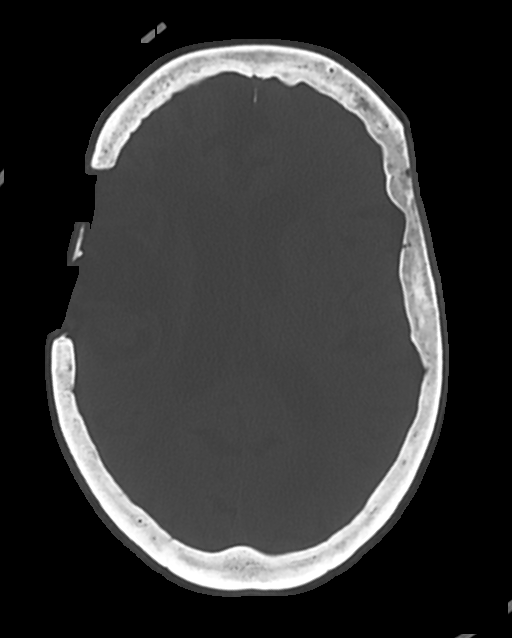
[im 56/80  bone]
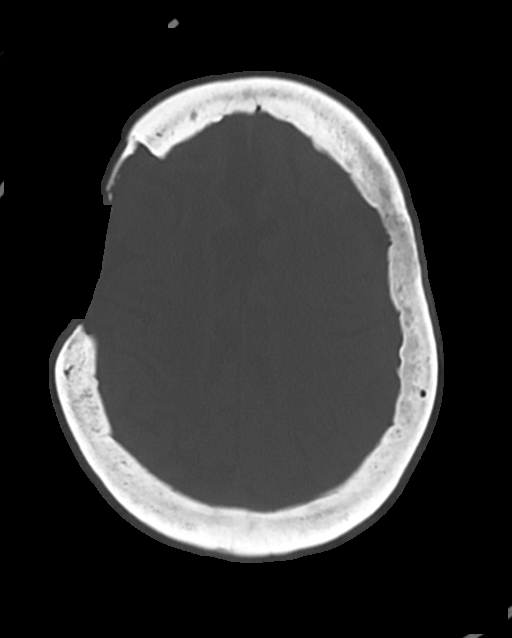
[im 64/80  bone]
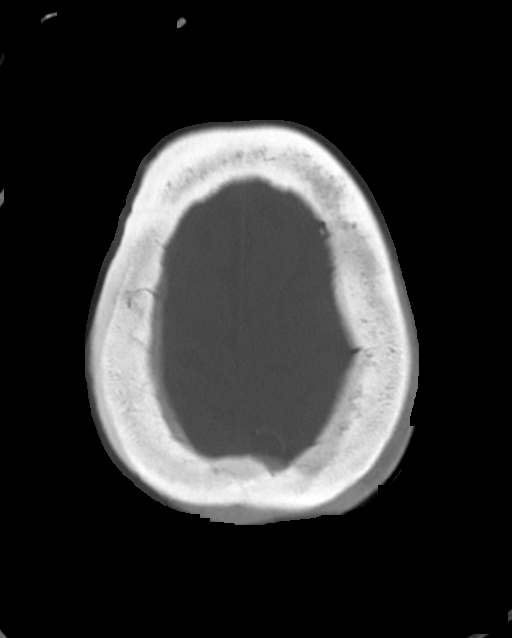

[14 of 30 positions shown; findings below may reference images not displayed]

FINDINGS: Brain: There is no mass, hemorrhage or extra-axial collection. There
is generalized atrophy without lobar predilection. Hypodensity of
the white matter is most commonly associated with chronic
microvascular disease. Old right MCA territory infarct.

Vascular: No abnormal hyperdensity of the major intracranial
arteries or dural venous sinuses. No intracranial atherosclerosis.

Skull: Remote right-sided craniectomy

Sinuses/Orbits: No fluid levels or advanced mucosal thickening of
the visualized paranasal sinuses. No mastoid or middle ear effusion.
The orbits are normal.
IMPRESSION: 1. No acute intracranial abnormality.
2. Old right MCA territory infarct and findings of chronic
microvascular ischemia.

## 2021-05-07 MED ORDER — HEPARIN SODIUM (PORCINE) 1000 UNIT/ML IJ SOLN
INTRAMUSCULAR | Status: AC
  Administered 2021-05-07: 3800 [IU]
  Filled 2021-05-07: qty 4

## 2021-05-07 MED ORDER — VANCOMYCIN HCL 500 MG/100ML IV SOLN
500.0000 mg | Freq: Once | INTRAVENOUS | Status: AC
  Administered 2021-05-07: 500 mg via INTRAVENOUS
  Filled 2021-05-07: qty 100

## 2021-05-07 MED ORDER — CHLORHEXIDINE GLUCONATE CLOTH 2 % EX PADS
6.0000 | MEDICATED_PAD | Freq: Every day | CUTANEOUS | Status: DC
  Administered 2021-05-08 – 2021-05-14 (×4): 6 via TOPICAL
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
## 2021-05-07 NOTE — Progress Notes (Signed)
PROGRESS NOTE    Kirsten Mcmahon  PPI:[id number] DOB: Apr 14, 1946 DOA: 04/28/2021 PCP: [facility]   Chief Complaint  Patient presents with  . Altered Mental Status   Brief Narrative:  Kirsten Mcmahon is Kirsten Mcmahon 75 y.o. female with Kirsten Mcmahon history of streptococcal bacteremia currently on vancomycin IV,ESRD, GERD, hypertension, hypothyroidism, non-insulin-dependent diabetes type 2, hyperlipidemia, CVA history. Patient presented secondary to confusion with concern for uremia secondary to unintentionally missed HD from significant amount of pain at home. She was admitted and started on HD.  Assessment & Plan:   Principal Problem:   ESRD (end stage renal disease) ([place]) Active Problems:   Hypertension   Hypothyroidism   Type II diabetes mellitus with renal manifestations (HCC)   Streptococcal bacteremia   Pressure injury of skin   Physical deconditioning   Generalized muscle weakness  ESRD on HD Newly started on hemodialysis earlier this month. Nephrology consulted and are continuing HD while inpatient on Kirsten Mcmahon MWF schedule which has been complicated by mental status. HD today. -Nephrology recommendations for HD; patient needs to tolerate HD in Kirsten Mcmahon chair prior to discharge.  Palliative care consulted by renal given concerns for ability to tolerated outpatient dialysis longterm.   Confusion  Acute Metabolic Encephalopathy  Concern for Cognitive Deficit Unsure of etiology. On admission, however, concern was uremic encephalopathy. Difficult to know what her baseline is. Per nephrology, mental status has not really improved since admission. Discussed with daughter, she notes before fall (Kirsten Mcmahon couple of months ago) was able to get to bathroom with walker/cane, sit in chair.  Needed some assistance with dressing.  Her confusion has been worse than normal since then as well.  Head CT 5/10 with chronic cortical/subcortical infarct within the R frontal operculum and R insula with overlying  craniotomy defect.  Associated wallerian degeneration of the R brainstem.  Redemonstrated chronic lacunar infarcts within the deep gray nuclei bilaterally.  Stable background severe cerebral white matter chronic small vessel ischemic disease.  Redemonstrated chronic infarcts within the bilateral cerebellar hemispheres.   Head CT today Follow ammonia, VBG, b12, folate, RPR No fevers or leukocytosis to suggest possible persistent bacteremia and most recent blood cultures were clear from previous hospitalization. It is possible patient has underlying cognitive impairment as this was suggested as Kirsten Mcmahon possibility from prior admission and may be at her baseline. Possibly stable at this time. Delirium precautions Per discussion with Kirsten Mcmahon provider, son, sounds like she was closer to her baseline mental status about 1 week ago give or take.  Son and daughter think progressive issues since fall (in February).     Streptococcus group G bacteremia This was diagnosed during previous hospitalization. At that time, Transesophageal Echocardiogram was recommended but not performed secondary to patient/family decline. Patient was discharged with recommendations for ampicillin IV until 06/01/2021; however, this appears to have changed to Vancomycin IV. On this admission, she was continued on Vancomycin IV.  Anemia of chronic kidney disease Stable. No evidence of hemorrhage.  Primary hypertension Patient is on metoprolol and clonidine as an outpatient BP soft today, hold metoprolol and clonidine patch  Hypothyroidism -Continue Synthroid 112 mcg daily  Diabetes mellitus, type 2 Well controlled. Patient is on Januvia as an outpatient. Last hemoglobin A1C of 5.7. Started on SSI and and Tradjenta inpatient. Hypoglycemic episodes. -Continue SSI -Will hold Tradjenta   History of seizures -Continue Keppra 500 mg daily and Keppra 250 mg three times weekly  Pressure injury Mid buttocks, present on  admission.  DVT prophylaxis:  heparin  Code Status: full  Family Communication: son over phone Disposition:   Status is: Inpatient  Remains inpatient appropriate because:Inpatient level of care appropriate due to severity of illness   Dispo: The patient is from: SNF              Anticipated d/c is to: SNF              Patient currently is not medically stable to d/c.   Difficult to place patient No       Consultants:   nephrology  Procedures:   none  Antimicrobials: Anti-infectives (From admission, onward)   Start     Dose/Rate Route Frequency Ordered Stop   05/07/21 1200  vancomycin (VANCOREADY) IVPB 500 mg/100 mL        500 mg 100 mL/hr over 60 Minutes Intravenous  Once 05/07/21 0803 05/07/21 1229   05/06/21 1200  vancomycin (VANCOCIN) IVPB 500 mg/100 ml premix        500 mg 100 mL/hr over 60 Minutes Intravenous Every M-W-F (Hemodialysis) 04/30/21 0727 06/01/21 2359   05/04/21 1800  vancomycin (VANCOREADY) IVPB 500 mg/100 mL  Status:  Discontinued        500 mg 100 mL/hr over 60 Minutes Intravenous  Once 05/04/21 1524 05/07/21 0803   05/04/21 1609  vancomycin (VANCOCIN) 500-5 MG/100ML-% IVPB       Note to Pharmacy: Kirsten Mcmahon  : cabinet override      05/04/21 1609 05/04/21 1613   05/01/21 1200  vancomycin (VANCOCIN) IVPB 500 mg/100 ml premix  Status:  Discontinued       Note to Pharmacy: For this week only she is going to receive hd on 5/26 and 5/28 so please give the Vancomycin. FOr next week back to mon wed fri   500 mg 100 mL/hr over 60 Minutes Intravenous Every M-W-F (Hemodialysis) 04/30/21 0723 04/30/21 0726   04/30/21 1800  vancomycin (VANCOCIN) IVPB 500 mg/100 ml premix       Note to Pharmacy: For this week only she is going to receive hd on 5/26 and 5/28 so please give the Vancomycin. FOr next week back to mon wed fri   500 mg 100 mL/hr over 60 Minutes Intravenous Every T-Th-Sa (1800) 04/30/21 0727 05/02/21 1839   04/29/21 1539  vancomycin  (VANCOCIN) 500-5 MG/100ML-% IVPB       Note to Pharmacy: Kirsten Mcmahon  : cabinet override      04/29/21 1539 04/29/21 1727   04/29/21 1200  vancomycin (VANCOREADY) IVPB 500 mg/100 mL  Status:  Discontinued        500 mg 100 mL/hr over 60 Minutes Intravenous Every M-W-F (Hemodialysis) 04/28/21 1812 04/30/21 0723         Subjective: Pleasantly confused Kirsten Mcmahon&Ox1  Objective: Vitals:   05/07/21 1028 05/07/21 1054 05/07/21 1111 05/07/21 1220  BP: 104/61 (!) 109/54 (!) 146/65 114/77  Pulse: 84 89 91 97  Resp:   20 20  Temp:   98.1 F (36.7 C) 98 F (36.7 C)  TempSrc:   Oral Oral  SpO2:   97% 94%  Weight:      Height:        Intake/Output Summary (Last 24 hours) at 05/07/2021 1657 Last data filed at 05/07/2021 1111 Gross per 24 hour  Intake 180 ml  Output 500 ml  Net -320 ml   Filed Weights   05/02/21 1245 05/04/21 1315 05/04/21 1720  Weight: 55 kg 56.3 kg 55.4 kg    Examination:  General:  No acute distress. Cardiovascular: Heart sounds show Kirsten Mcmahon regular rate, and rhythm. Lungs: Clear to auscultation bilaterally  Abdomen: Soft, nontender, nondistended  Neurological: Alert and oriented 1. L sided weakness. Extremities: No clubbing or cyanosis. No edema.   Data Reviewed: I have personally reviewed following labs and imaging studies  CBC: Recent Labs  Lab 05/01/21 0058 05/02/21 0034 05/03/21 0048 05/04/21 0534 05/06/21 0117 05/07/21 0101  WBC 7.6 7.4 8.6 6.5 10.2 10.1  NEUTROABS 5.1 4.9 6.4 4.2  --  7.0  HGB 8.8* 8.8* 9.5* 9.6* 9.1* 7.9*  HCT 26.9* 27.6* 29.4* 30.1* 28.4* 25.2*  MCV 90.0 90.5 89.4 91.2 89.6 91.3  PLT 269 288 229 264 198 101    Basic Metabolic Panel: Recent Labs  Lab 05/01/21 0058 05/02/21 0034 05/03/21 0048 05/04/21 0534 05/06/21 0117 05/07/21 0101  NA 134* 134* 133* 134* 131* 130*  K 4.0 4.0 3.9 4.0 4.3 4.8  CL 98 97* 95* 96* 94* 92*  CO2 27 26 28 26 24 26   GLUCOSE 96 90 133* 93 137* 121*  BUN 18 29* 9 22 28* 42*  CREATININE  3.54* 5.48* 2.86* 4.77* 4.76* 6.30*  CALCIUM 8.0* 8.6* 8.2* 9.0 9.1 8.6*  MG 2.0 2.2 2.0 2.2  --  2.1  PHOS  --   --   --   --   --  2.7    GFR: Estimated Creatinine Clearance: 6 mL/min (Kirsten Mcmahon) (by C-G formula based on SCr of 6.3 mg/dL (H)).  Liver Function Tests: Recent Labs  Lab 05/07/21 0101  AST 26  ALT 14  ALKPHOS 60  BILITOT 1.1  PROT 6.4*  ALBUMIN 2.4*    CBG: Recent Labs  Lab 05/06/21 0732 05/06/21 1218 05/06/21 1702 05/06/21 2027 05/07/21 1214  GLUCAP 121* 180* 123* 95 86     Recent Results (from the past 240 hour(s))  Resp Panel by RT-PCR (Flu Kirsten Mcmahon&B, Covid) Nasopharyngeal Swab     Status: None   Collection Time: 04/28/21  5:47 PM   Specimen: Nasopharyngeal Swab; Nasopharyngeal(NP) swabs in vial transport medium  Result Value Ref Range Status   SARS Coronavirus 2 by RT PCR NEGATIVE NEGATIVE Final    Comment: (NOTE) SARS-CoV-2 target nucleic acids are NOT DETECTED.  The SARS-CoV-2 RNA is generally detectable in upper respiratory specimens during the acute phase of infection. The lowest concentration of SARS-CoV-2 viral copies this assay can detect is 138 copies/mL. Kirsten Mcmahon negative result does not preclude SARS-Cov-2 infection and should not be used as the sole basis for treatment or other patient management decisions. Kirsten Mcmahon negative result may occur with  improper specimen collection/handling, submission of specimen other than nasopharyngeal swab, presence of viral mutation(s) within the areas targeted by this assay, and inadequate number of viral copies(<138 copies/mL). Kirsten Mcmahon negative result must be combined with clinical observations, patient history, and epidemiological information. The expected result is Negative.  Fact Sheet for Patients:  EntrepreneurPulse.com.au  Fact Sheet for Healthcare Providers:  IncredibleEmployment.be  This test is no t yet approved or cleared by the [place] FDA and  has been authorized for  detection and/or diagnosis of SARS-CoV-2 by FDA under an Emergency Use Authorization (EUA). This EUA will remain  in effect (meaning this test can be used) for the duration of the COVID-19 declaration under Section 564(b)(1) of the Act, 21 U.S.C.section 360bbb-3(b)(1), unless the authorization is terminated  or revoked sooner.       Influenza Kirsten Mcmahon by PCR NEGATIVE NEGATIVE Final   Influenza B by PCR NEGATIVE NEGATIVE  Final    Comment: (NOTE) The Xpert Xpress SARS-CoV-2/FLU/RSV plus assay is intended as an aid in the diagnosis of influenza from Nasopharyngeal swab specimens and should not be used as Kirsten Tischer sole basis for treatment. Nasal washings and aspirates are unacceptable for Xpert Xpress SARS-CoV-2/FLU/RSV testing.  Fact Sheet for Patients: EntrepreneurPulse.com.au  Fact Sheet for Healthcare Providers: IncredibleEmployment.be  This test is not yet approved or cleared by the [place] FDA and has been authorized for detection and/or diagnosis of SARS-CoV-2 by FDA under an Emergency Use Authorization (EUA). This EUA will remain in effect (meaning this test can be used) for the duration of the COVID-19 declaration under Section 564(b)(1) of the Act, 21 U.S.C. section 360bbb-3(b)(1), unless the authorization is terminated or revoked.  Performed at [facility], [place] [address], [place], [place] [address]          Radiology Studies: No results found.      Scheduled Meds: . Chlorhexidine Gluconate Cloth  6 each Topical Q0600  . Chlorhexidine Gluconate Cloth  6 each Topical Q0600  . darbepoetin (ARANESP) injection - DIALYSIS  25 mcg Intravenous Q Mon-HD  . feeding supplement (NEPRO CARB STEADY)  237 mL Oral BID BM  . heparin  5,000 Units Subcutaneous Q8H  . insulin aspart  0-5 Units Subcutaneous QHS  . insulin aspart  0-6 Units Subcutaneous TID WC  . latanoprost  1 drop Both Eyes QHS  . letrozole  2.5 mg Oral Daily  .  levETIRAcetam  500 mg Oral Q1500   And  . levETIRAcetam  250 mg Oral Once per day on Mon Wed Fri  . levothyroxine  112 mcg Oral QAC breakfast  . multivitamin  1 tablet Oral QHS  . vancomycin  500 mg Intravenous Q M,W,F-HD   Continuous Infusions:    LOS: 9 days    Time spent: over 30 min    Fayrene Helper, MD [facility]   To contact the attending provider between 7A-7P or the covering provider during after hours 7P-7A, please log into the web site [email or web address] and access using universal [place] password for that web site. If you do not have the password, please call the hospital operator.  05/07/2021, 4:57 PM

## 2021-05-07 NOTE — Progress Notes (Signed)
PT Cancellation Note  Patient Details Name: Kirsten Mcmahon MRN: [id number] DOB: 1946/10/11   Cancelled Treatment:    Reason Eval/Treat Not Completed: Patient at procedure or test/unavailable In HD- will attempt later if time/schedule allow.   Windell Norfolk, DPT, PN1   Supplemental Physical Therapist [facility]    Pager [phone] Acute Rehab Office [phone]

## 2021-05-07 NOTE — Progress Notes (Signed)
Pharmacy Antibiotic Note  Kirsten Mcmahon is a 75 y.o. female admitted on 04/28/2021 with group B Strep bacteremia.  Pharmacy has been consulted for vancomycin dosing in patient with ESRD - HD MWF.   Patient is currently in HD today.  Orders are entered to give on MWF. Today, WBC 6.5 and patient is afebrile. Of note, patient has difficulty tolerating HD sessions and important to monitor ability to complete HD sessions.   Plan:  Vancomycin 500 mg IV after HD on HD days, redose today     Height: 5\' 2"  (157.5 cm) Weight:  (on HD recliner) IBW/kg (Calculated) : 50.1  Temp (24hrs), Avg:98.1 F (36.7 C), Min:98 F (36.7 C), Max:98.3 F (36.8 C)  Recent Labs  Lab 05/02/21 0034 05/03/21 0048 05/04/21 0534 05/06/21 0117 05/07/21 0101  WBC 7.4 8.6 6.5 10.2 10.1  CREATININE 5.48* 2.86* 4.77* 4.76* 6.30*    Estimated Creatinine Clearance: 6 mL/min (A) (by C-G formula based on SCr of 6.3 mg/dL (H)).    Allergies  Allergen Reactions  . Contrast Media [Iodinated Diagnostic Agents]     Other reaction(s): NO ALLERGY  . Latex     Other reaction(s): NO ALLERGY  . Shellfish-Derived Products     Other reaction(s): NO ALLERGY  . Levemir [Insulin Detemir] Itching    Kirsten Mcmahon A. Levada Dy, PharmD, BCPS, FNKF Clinical Pharmacist [place] Please utilize Amion for appropriate phone number to reach the unit pharmacist ([place])

## 2021-05-07 NOTE — Progress Notes (Signed)
Critical lab value po2<31 on blood gas. MD notified

## 2021-05-07 NOTE — Progress Notes (Signed)
Renal Navigator and Renal PA discussed patient's situation. Due to AMS and confusion, patient became combative and refused HD at her first outpatient appointment (04/28/21). Per Medicaid Director at [name], having her son sit with her for safety while at outpatient clinic is not an option at [employer]. Navigator in process of searching for a clinic in the area who may accept her with son serving as Air cabin crew. Patient tolerated entire HD tx in recliner today. Navigator spoke with TOC CSW/N. Rayyan to ensure plan for patient is to discharge home with [facility]. This is son's plan, who, per CSW, states there is plenty of family to care for her. [facility] will transport to HD. CSW informed on need to change patient's HD clinic. Navigator will follow closely.  Alphonzo Cruise, [place] Renal Navigator [phone]

## 2021-05-07 NOTE — Progress Notes (Signed)
Patient's son at bedside. Son given update. Son does not want patient to go to SNF. He states that he already took care of patient before hospitalization, and he wants to continue to take care of her. There is already a hoyer lift and other equipment at home. Son asking for MD to update him tomorrow. He will be back in the morning. Son states that he is able to be here to assist with getting patient up in chair if needed. Son is going to contact outpt. HD center to see if he is able to be there during her HD to try to keep her in chair during HD.

## 2021-05-08 DIAGNOSIS — G9341 Metabolic encephalopathy: Secondary | ICD-10-CM

## 2021-05-08 DIAGNOSIS — R4189 Other symptoms and signs involving cognitive functions and awareness: Secondary | ICD-10-CM

## 2021-05-08 LAB — CBC WITH DIFFERENTIAL/PLATELET
Abs Immature Granulocytes: 0.03 10*3/uL (ref 0.00–0.07)
Basophils Absolute: 0 10*3/uL (ref 0.0–0.1)
Basophils Relative: 1 %
Eosinophils Absolute: 0.4 10*3/uL (ref 0.0–0.5)
Eosinophils Relative: 5 %
HCT: 27.3 % — ABNORMAL LOW (ref 36.0–46.0)
Hemoglobin: 8.6 g/dL — ABNORMAL LOW (ref 12.0–15.0)
Immature Granulocytes: 0 %
Lymphocytes Relative: 17 %
Lymphs Abs: 1.2 10*3/uL (ref 0.7–4.0)
MCH: 28.9 pg (ref 26.0–34.0)
MCHC: 31.5 g/dL (ref 30.0–36.0)
MCV: 91.6 fL (ref 80.0–100.0)
Monocytes Absolute: 0.6 10*3/uL (ref 0.1–1.0)
Monocytes Relative: 9 %
Neutro Abs: 4.7 10*3/uL (ref 1.7–7.7)
Neutrophils Relative %: 68 %
Platelets: 128 10*3/uL — ABNORMAL LOW (ref 150–400)
RBC: 2.98 MIL/uL — ABNORMAL LOW (ref 3.87–5.11)
RDW: 14.3 % (ref 11.5–15.5)
WBC: 6.8 10*3/uL (ref 4.0–10.5)
nRBC: 0 % (ref 0.0–0.2)

## 2021-05-08 LAB — COMPREHENSIVE METABOLIC PANEL
ALT: 16 U/L (ref 0–44)
AST: 22 U/L (ref 15–41)
Albumin: 2.7 g/dL — ABNORMAL LOW (ref 3.5–5.0)
Alkaline Phosphatase: 74 U/L (ref 38–126)
Anion gap: 12 (ref 5–15)
BUN: 19 mg/dL (ref 8–23)
CO2: 25 mmol/L (ref 22–32)
Calcium: 8.8 mg/dL — ABNORMAL LOW (ref 8.9–10.3)
Chloride: 99 mmol/L (ref 98–111)
Creatinine, Ser: 4.31 mg/dL — ABNORMAL HIGH (ref 0.44–1.00)
GFR, Estimated: 10 mL/min — ABNORMAL LOW (ref >60)
Glucose, Bld: 88 mg/dL (ref 70–99)
Potassium: 3.7 mmol/L (ref 3.5–5.1)
Sodium: 136 mmol/L (ref 135–145)
Total Bilirubin: 0.7 mg/dL (ref 0.3–1.2)
Total Protein: 7.1 g/dL (ref 6.5–8.1)

## 2021-05-08 LAB — TSH: TSH: 0.758 u[IU]/mL (ref 0.350–4.500)

## 2021-05-08 LAB — PHOSPHORUS: Phosphorus: 3.2 mg/dL (ref 2.5–4.6)

## 2021-05-08 LAB — GLUCOSE, CAPILLARY
Glucose-Capillary: 83 mg/dL (ref 70–99)
Glucose-Capillary: 91 mg/dL (ref 70–99)

## 2021-05-08 LAB — MAGNESIUM: Magnesium: 2.1 mg/dL (ref 1.7–2.4)

## 2021-05-08 LAB — RPR: RPR Ser Ql: NONREACTIVE

## 2021-05-08 MED ORDER — PROSOURCE PLUS PO LIQD
30.0000 mL | Freq: Three times a day (TID) | ORAL | Status: DC
  Administered 2021-05-10 – 2021-05-13 (×8): 30 mL via ORAL
  Filled 2021-05-08 (×7): qty 30

## 2021-05-08 MED ORDER — HEPARIN SODIUM (PORCINE) 1000 UNIT/ML IJ SOLN
INTRAMUSCULAR | Status: AC
  Administered 2021-05-08: 1000 [IU]
  Filled 2021-05-08: qty 4

## 2021-05-08 MED ORDER — VANCOMYCIN HCL IN DEXTROSE 500-5 MG/100ML-% IV SOLN
INTRAVENOUS | Status: AC
  Administered 2021-05-08: 500 mg via INTRAVENOUS
  Filled 2021-05-08: qty 100
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
## 2021-05-08 NOTE — Progress Notes (Signed)
PT Cancellation Note  Patient Details Name: CAMIYAH FRIBERG MRN: [id number] DOB: 1945-12-26   Cancelled Treatment:    Reason Eval/Treat Not Completed: Patient at procedure or test/unavailable at HD again. Will attempt to return as time/schedule allow.    Windell Norfolk, DPT, PN1   Supplemental Physical Therapist [facility]    Pager [phone] Acute Rehab Office [phone]

## 2021-05-08 NOTE — Progress Notes (Signed)
Nutrition Follow-up  DOCUMENTATION CODES:   Not applicable  INTERVENTION:   -Continue Nepro Shake po BID, each supplement provides 425 kcal and 19 grams protein -30 ml Prosource Plus TID, each supplement provides 100 kcals and 15 grams protein -Continue feeding assistance with meals -Continue renal MVI daily -Magic cup TID with meals, each supplement provides 290 kcal and 9 grams of protein  NUTRITION DIAGNOSIS:   Increased nutrient needs related to chronic illness (ESRD on HD) as evidenced by estimated needs.  Ongoing  GOAL:   Patient will meet greater than or equal to 90% of their needs  Progressing   MONITOR:   PO intake,Supplement acceptance,Labs,Weight trends,Skin,I & O's  REASON FOR ASSESSMENT:   Malnutrition Screening Tool    ASSESSMENT:   Kirsten Mcmahon is a 75 y.o. female with medical history significant of ESRD, having just been initiated on dialysis earlier this month, GERD, hypertension, hypothyroidism, non-insulin-dependent diabetes type 2, hyperlipidemia, CVA history, and seizure disorder who presents today from dialysis center after refusing dialysis stating "I already had dialysis today before I was discharged from the hospital" per report  5/26- s/p HD catheter exchange   Reviewed I/O's: -501 ml x 24 hours and -3.5 L since admission  Per nephrology notes, plan for HD today in recliner. Pt son desires to take pt home. Per renal coordinator note, trying to find an outpatient HD facility that will accept pt with son as Actuary.   Case discussed with nurse tech, who reports pt consumed about 25% of breakfast this morning. Pt needed assistance with most items, but was able to feed herself about one piece of bacon. Observed tray- pt consumed about 76% of eggs, one slice of bacon, and 808% of coffee. Per nurse tech, pt consumes liquids well.   Pt more alert and interactive compared to last week. She reports she is feeling better and her appetite is  improving. Noted meal completions 0-50%. Pt reports she is taking Nepro shakes, however, noted multiple refusals per MAR. She shares she is able to receive food items she likes on the menu.   Discussed with pt importance of good meal and supplement intake to promote healing.  Labs reviewed.   Diet Order:   Diet Order            Diet renal with fluid restriction Fluid restriction: 1200 mL Fluid; Room service appropriate? Yes; Fluid consistency: Thin  Diet effective now                 EDUCATION NEEDS:   Not appropriate for education at this time  Skin:  Skin Assessment: Skin Integrity Issues: Skin Integrity Issues:: Stage II Stage II: buttocks Other: MASD to bilateral gluteal folds  Last BM:  05/08/21  Height:   Ht Readings from Last 1 Encounters:  04/29/21 5\' 2"  (1.575 m)    Weight:   Wt Readings from Last 1 Encounters:  05/04/21 55.4 kg    Ideal Body Weight:  50 kg  BMI:  Body mass index is 22.34 kg/m.  Estimated Nutritional Needs:   Kcal:  1650-1850  Protein:  75-90 grams  Fluid:  1000 ml + UOP    [name], RD, LDN, CDCES Registered Dietitian II Certified Diabetes Care and Education Specialist Please refer to [facility] for RD and/or RD on-call/weekend/after hours pager

## 2021-05-08 NOTE — Progress Notes (Signed)
PROGRESS NOTE  Kirsten Mcmahon QPR:[id number] DOB: 1946-08-20   PCP: [place]  Patient is from: Home.  Patient is followed by [facility]  DOA: 04/28/2021 LOS: 10  Chief complaints:  Chief Complaint  Patient presents with  . Altered Mental Status     Brief Narrative / Interim history: 75 year old F with history of Streptococcal bacteremia on IV vanc, ESRD on HD MWF, NIDDM-2, CVA, HTN, HLD and cognitive deficit presenting with confusion and admitted for encephalopathy felt to be due to uremia in the setting of missed HD.  His BUN was 66 on admission.  However, encephalopathy did not improve after dialysis.  Family reports progressive confusion since her fall 2 months prior.  She was also on clonidine patch which has been discontinued since admission.  CT head without acute finding but old right MCA infarct.  She is on Keppra 500 mg daily with additional 250 mg after HD.  Basic encephalopathy work-up including VBG, ammonia, TSH, B12 and RPR unrevealing.  Palliative medicine consulted.  Still full code with full scope of care and HD.  Renal navigator checking for HD unit in the area that will allow a chairside sitter, so family can sit with patient during dialysis session.  Subjective: Seen and examined this afternoon after she returned from hemodialysis.  Per patient's RN, she did dialysis on chair.  She has no complaints but not a reliable historian.  She is drowsy and only oriented to self.  She thinks she is at her son's home.  She responds no to pain.  Follows some commands.  Objective: Vitals:   05/08/21 1400 05/08/21 1430 05/08/21 1445 05/08/21 1500  BP: 113/67 (!) 114/54 127/77 136/76  Pulse: (!) 101 65 (!) 102 (!) 101  Resp:    18  Temp:   98.8 F (37.1 C) 98.8 F (37.1 C)  TempSrc:   Oral Oral  SpO2:   96% 100%  Height:        Intake/Output Summary (Last 24 hours) at 05/08/2021 1643 Last data filed at 05/08/2021 1445 Gross per 24 hour  Intake 60 ml   Output 1 ml  Net 59 ml   Filed Weights    Examination:  GENERAL: No apparent distress.  Nontoxic. HEENT: MMM.  Vision and hearing grossly intact.  NECK: Supple.  No apparent JVD.  RESP: On RA.  No IWOB.  Fair aeration bilaterally. CVS:  RRR. Heart sounds normal.  ABD/GI/GU: BS+. Abd soft, NTND.  MSK/EXT:  Moves extremities. No apparent deformity. No edema.  SKIN: no apparent skin lesion or wound NEURO: Awake but not alert.  Oriented only to self.  She thinks she is at her son's home.  No facial asymmetry.  PERRL.  Does not seem to move her left side much but difficult exam due to mental status.Marland Kitchen  PSYCH: Calm. Normal affect.   Procedures:  Hemodialysis  Microbiology summarized: [id number] and influenza PCR nonreactive.  Assessment & Plan: Major cognitive impairment/possible acute metabolic encephalopathy-initially felt to be due to uremia from missed HD but no improvement with dialysis.  She could have vascular dementia. CTH without acute finding but chronic right MCA CVA.  Other encephalopathy work-up including VBG, TSH, ammonia, B12 and RPR unrevealing.  Patient is also on clonidine patch that was discontinued since admission.  She is also on Keppra which is renally dosed but could still cause some degree of encephalopathy.  She is only oriented to self today. -Reorientation and delirium precautions -Avoid sedating medications  ESRD on HD MWF-Seems to be tolerating HD in a chair but requires sitter.   Bone mineral disorder -Renal navigator checking for HD unit in the area that will allow a chair side sitter.  -Palliative medicine following as well.  Streptococcus group G bacteremia-diagnosed prior hospitalization.  Family declined TTE at that time.  Plan was IV ampicillin until 06/01/2021 that seems to have changed to IV vancomycin with HD. -Continue IV vancomycin with HD  Anemia of chronic kidney disease: H&H relatively stable. Recent Labs    04/28/21 1513 04/29/21 0139  04/30/21 0132 05/01/21 0058 05/02/21 0034 05/03/21 0048 05/04/21 0534 05/06/21 0117 05/07/21 0101 05/08/21 0445  HGB 10.2* 10.2* 9.4* 8.8* 8.8* 9.5* 9.6* 9.1* 7.9* 8.6*  -ESA and IV iron per nephrology  Essential hypertension: On metoprolol and clonidine patch at home.  Clonidine d/cd in the setting of encephalopathy -Normotensive off home medications.  Hypothyroidism: TSH within normal -Continue Synthroid 112 mcg daily  Controlled NIDDM-2 with ESRD: A1c 5.7%.  CBG within appropriate range. Recent Labs  Lab 05/07/21 1214 05/07/21 1702 05/07/21 2048 05/08/21 0738 05/08/21 1634  GLUCAP 86 107* 92 83 91  -Discontinue CBG monitoring and SSI.  History of seizures: Seems a stable. -Continue Keppra 500 mg daily and Keppra 250 mg three times weekly  History of right MCA CVA-CT head without acute finding.  Seems to have residual left-sided deficit. -Continue home meds  Pressure injury Mid buttocks, present on admission.  Goal of care counseling: Patient with multiple comorbidities as above.  No significant improvement in mental status despite all interventions.  Poor long-term prognosis.  Still full code which I believe would pose more harm than benefit.  Patient is followed by [facility]. I had extensive discussion with patient's son, [name] over the phone.  We discussed about the pros and cons of CPR and intubation.  He asked for my opinion and I recommended DNR and DNI but he struggled to make a decision, and wanted to continue full code. -Palliative medicine following as well.  Nutrition Body mass index is 22.34 kg/m. Nutrition Problem: Increased nutrient needs Etiology: chronic illness (ESRD on HD) Signs/Symptoms: estimated needs Interventions: Nepro shake,MVI,Prostat Pressure Injury 04/29/21 Buttocks Mid Stage 2 -  Partial thickness loss of dermis presenting as a shallow open injury with a red, pink wound bed without slough. (Active)  04/29/21 0045  Location:  Buttocks  Location Orientation: Mid  Staging: Stage 2 -  Partial thickness loss of dermis presenting as a shallow open injury with a red, pink wound bed without slough.  Wound Description (Comments):   Present on Admission: Yes   DVT prophylaxis:  heparin injection 5,000 Units Start: 04/30/21 2200  Code Status: Full code-confirmed Family Communication: Updated patient's son over the phone. Level of care: Telemetry Cardiac Status is: Inpatient  Remains inpatient appropriate because:Altered mental status and Inpatient level of care appropriate due to severity of illness .  Renal navigator searching for HD unit that can accept with chairside sitter   Dispo: The patient is from: Home              Anticipated d/c is to: Home              Patient currently is medically stable to d/c.   Difficult to place patient No       Consultants:  Palliative medicine Nephrology   Sch Meds:  Scheduled Meds: . (feeding supplement) PROSource Plus  30 mL Oral TID BM  . Chlorhexidine Gluconate Cloth  6 each Topical Q0600  . Chlorhexidine Gluconate Cloth  6 each Topical Q0600  . darbepoetin (ARANESP) injection - DIALYSIS  25 mcg Intravenous Q Mon-HD  . feeding supplement (NEPRO CARB STEADY)  237 mL Oral BID BM  . heparin  5,000 Units Subcutaneous Q8H  . insulin aspart  0-5 Units Subcutaneous QHS  . insulin aspart  0-6 Units Subcutaneous TID WC  . latanoprost  1 drop Both Eyes QHS  . letrozole  2.5 mg Oral Daily  . levETIRAcetam  500 mg Oral Q1500   And  . levETIRAcetam  250 mg Oral Once per day on Mon Wed Fri  . levothyroxine  112 mcg Oral QAC breakfast  . multivitamin  1 tablet Oral QHS  . vancomycin  500 mg Intravenous Q M,W,F-HD   Continuous Infusions: PRN Meds:.acetaminophen  Antimicrobials: Anti-infectives (From admission, onward)   Start     Dose/Rate Route Frequency Ordered Stop   05/07/21 1200  vancomycin (VANCOREADY) IVPB 500 mg/100 mL        500 mg 100 mL/hr over 60  Minutes Intravenous  Once 05/07/21 0803 05/07/21 1229   05/06/21 1200  vancomycin (VANCOCIN) IVPB 500 mg/100 ml premix        500 mg 100 mL/hr over 60 Minutes Intravenous Every M-W-F (Hemodialysis) 04/30/21 0727 06/01/21 2359   05/04/21 1800  vancomycin (VANCOREADY) IVPB 500 mg/100 mL  Status:  Discontinued        500 mg 100 mL/hr over 60 Minutes Intravenous  Once 05/04/21 1524 05/07/21 0803   05/04/21 1609  vancomycin (VANCOCIN) 500-5 MG/100ML-% IVPB       Note to Pharmacy: [name]  : cabinet override      05/04/21 1609 05/04/21 1613   05/01/21 1200  vancomycin (VANCOCIN) IVPB 500 mg/100 ml premix  Status:  Discontinued       Note to Pharmacy: For this week only she is going to receive hd on 5/26 and 5/28 so please give the Vancomycin. FOr next week back to mon wed fri   500 mg 100 mL/hr over 60 Minutes Intravenous Every M-W-F (Hemodialysis) 04/30/21 0723 04/30/21 0726   04/30/21 1800  vancomycin (VANCOCIN) IVPB 500 mg/100 ml premix       Note to Pharmacy: For this week only she is going to receive hd on 5/26 and 5/28 so please give the Vancomycin. FOr next week back to mon wed fri   500 mg 100 mL/hr over 60 Minutes Intravenous Every T-Th-Sa (1800) 04/30/21 0727 05/02/21 1839   04/29/21 1539  vancomycin (VANCOCIN) 500-5 MG/100ML-% IVPB       Note to Pharmacy: [name]  : cabinet override      04/29/21 1539 04/29/21 1727   04/29/21 1200  vancomycin (VANCOREADY) IVPB 500 mg/100 mL  Status:  Discontinued        500 mg 100 mL/hr over 60 Minutes Intravenous Every M-W-F (Hemodialysis) 04/28/21 1812 04/30/21 0723       I have personally reviewed the following labs and images: CBC: Recent Labs  Lab 05/02/21 0034 05/03/21 0048 05/04/21 0534 05/06/21 0117 05/07/21 0101 05/08/21 0445  WBC 7.4 8.6 6.5 10.2 10.1 6.8  NEUTROABS 4.9 6.4 4.2  --  7.0 4.7  HGB 8.8* 9.5* 9.6* 9.1* 7.9* 8.6*  HCT 27.6* 29.4* 30.1* 28.4* 25.2* 27.3*  MCV 90.5 89.4 91.2 89.6 91.3 91.6   PLT 288 229 264 198 197 128*   BMP &GFR Recent Labs  Lab 05/02/21 0034 05/03/21 0048 05/04/21 0534 05/06/21 4696  05/07/21 0101 05/08/21 0445  NA 134* 133* 134* 131* 130* 136  K 4.0 3.9 4.0 4.3 4.8 3.7  CL 97* 95* 96* 94* 92* 99  CO2 26 28 26 24 26 25   GLUCOSE 90 133* 93 137* 121* 88  BUN 29* 9 22 28* 42* 19  CREATININE 5.48* 2.86* 4.77* 4.76* 6.30* 4.31*  CALCIUM 8.6* 8.2* 9.0 9.1 8.6* 8.8*  MG 2.2 2.0 2.2  --  2.1 2.1  PHOS  --   --   --   --  2.7 3.2   Estimated Creatinine Clearance: 8.8 mL/min (A) (by C-G formula based on SCr of 4.31 mg/dL (H)). Liver & Pancreas: Recent Labs  Lab 05/07/21 0101 05/08/21 0445  AST 26 22  ALT 14 16  ALKPHOS 60 74  BILITOT 1.1 0.7  PROT 6.4* 7.1  ALBUMIN 2.4* 2.7*   No results for input(s): LIPASE, AMYLASE in the last 168 hours. Recent Labs  Lab 05/07/21 1859  AMMONIA 26   Diabetic: No results for input(s): HGBA1C in the last 72 hours. Recent Labs  Lab 05/07/21 1214 05/07/21 1702 05/07/21 2048 05/08/21 0738 05/08/21 1634  GLUCAP 86 107* 92 83 91   Cardiac Enzymes: No results for input(s): CKTOTAL, CKMB, CKMBINDEX, TROPONINI in the last 168 hours. No results for input(s): PROBNP in the last 8760 hours. Coagulation Profile: No results for input(s): INR, PROTIME in the last 168 hours. Thyroid Function Tests: Recent Labs    05/08/21 0841  TSH 0.758   Lipid Profile: No results for input(s): CHOL, HDL, LDLCALC, TRIG, CHOLHDL, LDLDIRECT in the last 72 hours. Anemia Panel: Recent Labs    05/07/21 1859  VITAMINB12 532  FOLATE 26.1   Urine analysis:    Component Value Date/Time   COLORURINE YELLOW (A) 04/17/2021 1702   APPEARANCEUR HAZY (A) 04/17/2021 1702   LABSPEC 1.015 04/17/2021 1702   PHURINE 7.0 04/17/2021 1702   GLUCOSEU 150 (A) 04/17/2021 1702   HGBUR NEGATIVE 04/17/2021 1702   BILIRUBINUR NEGATIVE 04/17/2021 1702   KETONESUR 5 (A) 04/17/2021 1702   PROTEINUR >=300 (A) 04/17/2021 1702   NITRITE  NEGATIVE 04/17/2021 1702   LEUKOCYTESUR NEGATIVE 04/17/2021 1702   Sepsis Labs: Invalid input(s): PROCALCITONIN, [place]  Microbiology: Recent Results (from the past 240 hour(s))  Resp Panel by RT-PCR (Flu A&B, Covid) Nasopharyngeal Swab     Status: None   Collection Time: 04/28/21  5:47 PM   Specimen: Nasopharyngeal Swab; Nasopharyngeal(NP) swabs in vial transport medium  Result Value Ref Range Status   SARS Coronavirus 2 by RT PCR NEGATIVE NEGATIVE Final    Comment: (NOTE) SARS-CoV-2 target nucleic acids are NOT DETECTED.  The SARS-CoV-2 RNA is generally detectable in upper respiratory specimens during the acute phase of infection. The lowest concentration of SARS-CoV-2 viral copies this assay can detect is 138 copies/mL. A negative result does not preclude SARS-Cov-2 infection and should not be used as the sole basis for treatment or other patient management decisions. A negative result may occur with  improper specimen collection/handling, submission of specimen other than nasopharyngeal swab, presence of viral mutation(s) within the areas targeted by this assay, and inadequate number of viral copies(<138 copies/mL). A negative result must be combined with clinical observations, patient history, and epidemiological information. The expected result is Negative.  Fact Sheet for Patients:  EntrepreneurPulse.com.au  Fact Sheet for Healthcare Providers:  IncredibleEmployment.be  This test is no t yet approved or cleared by the [place] FDA and  has been authorized for detection and/or  diagnosis of SARS-CoV-2 by FDA under an Emergency Use Authorization (EUA). This EUA will remain  in effect (meaning this test can be used) for the duration of the COVID-19 declaration under Section 564(b)(1) of the Act, 21 U.S.C.section 360bbb-3(b)(1), unless the authorization is terminated  or revoked sooner.       Influenza A by PCR NEGATIVE  NEGATIVE Final   Influenza B by PCR NEGATIVE NEGATIVE Final    Comment: (NOTE) The Xpert Xpress SARS-CoV-2/FLU/RSV plus assay is intended as an aid in the diagnosis of influenza from Nasopharyngeal swab specimens and should not be used as a sole basis for treatment. Nasal washings and aspirates are unacceptable for Xpert Xpress SARS-CoV-2/FLU/RSV testing.  Fact Sheet for Patients: EntrepreneurPulse.com.au  Fact Sheet for Healthcare Providers: IncredibleEmployment.be  This test is not yet approved or cleared by the [place] FDA and has been authorized for detection and/or diagnosis of SARS-CoV-2 by FDA under an Emergency Use Authorization (EUA). This EUA will remain in effect (meaning this test can be used) for the duration of the COVID-19 declaration under Section 564(b)(1) of the Act, 21 U.S.C. section 360bbb-3(b)(1), unless the authorization is terminated or revoked.  Performed at [facility], [place] [address] [place],  [address]     Radiology Studies: CT HEAD WO CONTRAST  Result Date: 05/07/2021 CLINICAL DATA:  Encephalopathy EXAM: CT HEAD WITHOUT CONTRAST TECHNIQUE: Contiguous axial images were obtained from the base of the skull through the vertex without intravenous contrast. COMPARISON:  None. FINDINGS: Brain: There is no mass, hemorrhage or extra-axial collection. There is generalized atrophy without lobar predilection. Hypodensity of the white matter is most commonly associated with chronic microvascular disease. Old right MCA territory infarct. Vascular: No abnormal hyperdensity of the major intracranial arteries or dural venous sinuses. No intracranial atherosclerosis. Skull: Remote right-sided craniectomy Sinuses/Orbits: No fluid levels or advanced mucosal thickening of the visualized paranasal sinuses. No mastoid or middle ear effusion. The orbits are normal. IMPRESSION: 1. No acute intracranial abnormality. 2. Old right  MCA territory infarct and findings of chronic microvascular ischemia. Electronically Signed   By: Ulyses Jarred M.D.   On: 05/07/2021 19:47      Shalane Florendo T. [place]  If 7PM-7AM, please contact night-coverage [email or web address] 05/08/2021, 4:43 PM

## 2021-05-08 NOTE — Plan of Care (Signed)
  Problem: Safety: Goal: Ability to remain free from injury will improve Outcome: Progressing   Problem: Skin Integrity: Goal: Risk for impaired skin integrity will decrease Outcome: Progressing   

## 2021-05-08 NOTE — TOC Progression Note (Addendum)
Transition of Care [facility]) - Progression Note    Patient Details  Name: [name] MRN: [id number] Date of Birth: [birth year]  Transition of Care [facility]) CM/SW [place], LCSW Phone Number: 05/08/2021, 9:11 AM  Clinical Narrative:    9:11am-CSW left voicemail for patient's [facility] social worker, Benedict Needy, to provide an update.  9:33am-CSW received return call from [place] and [place] provider, [name] [phone]. She stated their provider spoke with the Nephrology PA yesterday and was told there would be a Palliative consult. CSW confirmed a consult was placed and she requested to speak with the Palliative MD. CSW provided her information to the Palliative MD. She expressed concern over patient continuing dialysis and reported that patient's son is not reliable, even when coming to pick patient up from [facility].     Expected Discharge Plan: [place] Barriers to Discharge: Continued Medical Work up  Expected Discharge Plan and Services Expected Discharge Plan: [place]   Discharge Planning Services: CM Consult   Living arrangements for the past 2 months: Single Family Home                                       Social Determinants of Health (SDOH) Interventions    Readmission Risk Interventions Readmission Risk Prevention Plan 04/11/2021  Transportation Screening Complete  PCP or Specialist Appt within 3-5 Days Complete  HRI or [place] Complete  Social Work Consult for [place] Planning/Counseling Complete  Palliative Care Screening Not Applicable  Medication Review Press photographer) Complete  Some recent data might be hidden

## 2021-05-08 NOTE — Consult Note (Signed)
Consultation Note Date: 05/08/2021   Patient Name: Kirsten Mcmahon  DOB: 06-01-1946  MRN: [id number]  Age / Sex: 75 y.o., female  PCP: [place] Referring Physician: Mercy Riding, MD  Reason for Consultation: Establishing goals of care  HPI/Patient Profile: 75 y.o. female  with past medical history of streptococcal bacteremia, ESRD, GERD, HTN, hypothyroid, DM, HLP, CVA admitted on 04/28/2021 with confusion.  Concern is for uremia secondary to missed HD.  There continues to be concern about her ability to sit in chair for dilaysis with agitation and hypotension that may preclude her from being able to continue outpatient dialysis.  Palliative consulted for goals of care.   Clinical Assessment and Goals of Care: I met today with Kirsten Mcmahon.  She was lying in bed in no distress at time that I saw her.  She was able to answer some simple questions, but she is slow to respond and does not have insight into her condition.   I called and was able to reach patient's son, Kirsten Mcmahon.   I introduced palliative care as specialized medical care for people living with serious illness. It focuses on providing relief from the symptoms and stress of a serious illness. The goal is to improve quality of life for both the patient and the family.  Kirsten Mcmahon tells me that the things most important to her are her family and her faith.  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   We discussed clinical course as well as wishes moving forward in regard to advanced directives.  We discussed difference between a aggressive medical intervention path and a palliative, comfort  focused care path.  Values and goals of care important to patient and family were attempted to be elicited.  Kirsten Mcmahon feels that she still enjoys good quality of life and that dialysis is supporting continued quality of life for his mother.  We discussed family hope that there will be an opportunity to continue dialysis at a facility that will allow him to sit with her for her sessions.  Renal team is looking into this.  Kirsten Mcmahon would like to wait to discuss long term goals until we have answer to potential for this as an option as this is their wish if it can be arranged.  Questions and concerns addressed.   PMT will continue to support holistically.  SUMMARY OF RECOMMENDATIONS   - Full code/full scope - Family is invested in plan to continue with aggressive interventions for as long as possible.  Her son understands concerns about her ability to tolerate dialysis with her confusion, but he is hopeful that there will be an outpatient dialysis clinic that will allow him to sit with her to assist in completing dialysis.  She was able to tolerate session sitting in chair today. - Noted that renal navigator is looking for clinic that may accept her with son serving as Air cabin crew.  This is family wish if it can be arranged.  We will need to determine if this is an option before family will engage in conversation about any other plan. - Palliative will continue to follow.   Code Status/Advance Care Planning:  Full code  Palliative Prophylaxis:   Delirium Protocol and Frequent Pain Assessment  Psycho-social/Spiritual:   Desire for further Chaplaincy support:Did not address today  Additional Recommendations: Caregiving  Support/Resources  Prognosis:   Unable to determine as will depend on options for continuation of dialysis  Discharge Planning: To Be Determined      Primary Diagnoses: Present on Admission: . Streptococcal bacteremia . Hypertension . Hypothyroidism . Type II  diabetes mellitus with renal manifestations ([place])   I have reviewed the medical record, interviewed the patient and family, and examined the patient. The following aspects are pertinent.  Past Medical History:  Diagnosis Date  . Cancer [facility]) 2017   Right breast  . Depression   . GERD (gastroesophageal reflux disease)   . Glaucoma   . Hemiparesis ([place])    left side  . High cholesterol   . History of seizure    x 1 - after a spider bite  . History of stroke with residual deficit    left-side weakness  . Hypertension    states BP under control with meds., has been on med. x 2 yr.  . Hypothyroidism   . Non-insulin dependent type 2 diabetes mellitus ([place])   . Overactive bladder   . Stroke [facility])    1998 weakness on left side   Social History   Socioeconomic History  . Marital status: Single    Spouse name: Not on file  . Number of children: Not on file  . Years of education: Not on file  . Highest education level: Not on file  Occupational History  . Not on file  Tobacco Use  . Smoking status: Never Smoker  . Smokeless tobacco: Never Used  Vaping Use  . Vaping Use: Never used  Substance and Sexual Activity  . Alcohol use: No  . Drug use: No  . Sexual activity: Never    Birth control/protection: Post-menopausal  Other Topics Concern  . Not on file  Social History Narrative   Lives with son and wife   Social Determinants of Health   Financial Resource Strain: Not on file  Food Insecurity: Not on file  Transportation Needs: Not on file  Physical Activity: Not on file  Stress: Not on file  Social Connections: Not on file   Family History  Problem Relation Age of Onset  . Cancer Brother        possible prostate cancer per her daughter  . Breast cancer Neg Hx    Scheduled Meds: . Chlorhexidine Gluconate Cloth  6 each Topical Q0600  . Chlorhexidine Gluconate Cloth  6 each Topical Q0600  . darbepoetin (ARANESP) injection - DIALYSIS  25 mcg Intravenous Q Mon-HD   . feeding supplement (NEPRO CARB STEADY)  237 mL Oral BID BM  . heparin  5,000 Units Subcutaneous Q8H  . insulin aspart  0-5 Units Subcutaneous QHS  . insulin aspart  0-6 Units Subcutaneous TID WC  . latanoprost  1 drop Both Eyes QHS  . letrozole  2.5 mg Oral Daily  . levETIRAcetam  500 mg Oral Q1500  And  . levETIRAcetam  250 mg Oral Once per day on Mon Wed Fri  . levothyroxine  112 mcg Oral QAC breakfast  . multivitamin  1 tablet Oral QHS  . vancomycin  500 mg Intravenous Q M,W,F-HD   Continuous Infusions: PRN Meds:.acetaminophen Medications Prior to Admission:  Prior to Admission medications   Medication Sig Start Date End Date Taking? Authorizing Provider  aspirin EC 81 MG tablet Take 81 mg by mouth daily.   Yes [provider]  cholecalciferol (VITAMIN D) 1000 units tablet Take 1,000 Units by mouth daily.   Yes [provider]  cloNIDine (CATAPRES - DOSED IN MG/24 HR) 0.2 mg/24hr patch Place 0.2 mg onto the skin once a week. Wednesday 12/19/20  Yes [provider]  JANUVIA 25 MG tablet Take 25 mg by mouth daily. 10/23/20  Yes [provider]  KEPPRA XR 500 MG 24 hr tablet Take 500 mg by mouth daily. 01/21/21  Yes [provider]  latanoprost (XALATAN) 0.005 % ophthalmic solution Place 1 drop into both eyes at bedtime. 09/07/18  Yes Cook, Jayce G, DO  letrozole (FEMARA) 2.5 MG tablet Take 1 tablet (2.5 mg total) by mouth daily. 02/16/19  Yes Nicholas Lose, MD  levothyroxine (SYNTHROID, LEVOTHROID) 112 MCG tablet Take 1 tablet (112 mcg total) by mouth daily before breakfast. 09/07/18  Yes Cook, Jayce G, DO  metoprolol succinate (TOPROL-XL) 25 MG 24 hr tablet Take 50 mg by mouth daily. 08/20/20  Yes [provider]  vancomycin (VANCOREADY) 500 MG/100ML IVPB Inject 100 mLs (500 mg total) into the vein every Monday, Wednesday, and Friday with hemodialysis. 04/24/21 06/01/21 Yes Wyvonnia Dusky, MD   Allergies  Allergen Reactions  .  Contrast Media [Iodinated Diagnostic Agents]     Other reaction(s): NO ALLERGY  . Latex     Other reaction(s): NO ALLERGY  . Shellfish-Derived Products     Other reaction(s): NO ALLERGY  . Levemir [Insulin Detemir] Itching   Review of Systems Denies all complaints, but question as she is confused  Physical Exam  General: Alert, awake, in no acute distress.  Slow to answer questions and has poor insight.  Heart: Regular rate and rhythm. No murmur appreciated. Lungs: Fair air movement, clear Abdomen: Soft, nontender, nondistended, positive bowel sounds.  Ext: No significant edema Skin: Warm and dry  Vital Signs: BP 123/67 (BP Location: Left Arm)   Pulse 91   Temp 98.4 F (36.9 C) (Axillary)   Resp 18   Ht '5\' 2"'  (1.575 m)   Wt 55.4 kg   SpO2 97%   BMI 22.34 kg/m  Pain Scale: PAINAD POSS *See Group Information*: 1-Acceptable,Awake and alert Pain Score: 0-No pain   SpO2: SpO2: 97 % O2 Device:SpO2: 97 % O2 Flow Rate: .   IO: Intake/output summary:   Intake/Output Summary (Last 24 hours) at 05/08/2021 0809 Last data filed at 05/08/2021 0700 Gross per 24 hour  Intake --  Output 501 ml  Net -501 ml    LBM: Last BM Date: 05/05/21 Baseline Weight: Weight: 57 kg Most recent weight: Weight:  (on HD recliner)     Palliative Assessment/Data:   Flowsheet Rows   Flowsheet Row Most Recent Value  Intake Tab   Referral Department Hospitalist  Unit at Time of Referral Med/Surg Unit  Palliative Care Primary Diagnosis Nephrology  Date Notified 05/07/21  Palliative Care Type New Palliative care  Reason for referral Clarify Goals of Care  Date of Admission 04/28/21  Date first seen  by Palliative Care 05/07/21  # of days Palliative referral response time 0 Day(s)  # of days IP prior to Palliative referral 9  Clinical Assessment   Palliative Performance Scale Score 30%  Psychosocial & Spiritual Assessment   Palliative Care Outcomes   Patient/Family meeting held? Yes  Who  was at the meeting? Patient, son via phone      Time In: 1730 Time Out: 1850 Time Total: 80 Greater than 50%  of this time was spent counseling and coordinating care related to the above assessment and plan.  Signed by: Micheline Rough, MD   Please contact Palliative Medicine Team phone at [phone] for questions and concerns.  For individual provider: See Shea Evans

## 2021-05-09 LAB — GLUCOSE, CAPILLARY: Glucose-Capillary: 100 mg/dL — ABNORMAL HIGH (ref 70–99)

## 2021-05-09 NOTE — Progress Notes (Signed)
PROGRESS NOTE  Kirsten Mcmahon ZYS:[id number] DOB: 15-Apr-1946   PCP: [place]  Patient is from: Home.  Patient is followed by [facility]  DOA: 04/28/2021 LOS: 11  Chief complaints:  Chief Complaint  Patient presents with  . Altered Mental Status     Brief Narrative / Interim history: 75 year old F with history of Streptococcal bacteremia on IV vanc, ESRD on HD MWF, NIDDM-2, CVA with left hemiparesis, HTN, HLD and cognitive deficit presenting with confusion and admitted for encephalopathy felt to be due to uremia in the setting of missed HD.  His BUN was 66 on admission.  However, encephalopathy did not improve after dialysis.  Family reports progressive confusion since her fall 2 months prior.  She was also on clonidine patch which has been discontinued since admission.  CT head without acute finding but old right MCA infarct.  She is on Keppra 500 mg daily with additional 250 mg after HD.  Basic encephalopathy work-up including VBG, ammonia, TSH, B12 and RPR unrevealing.  Palliative medicine consulted.  Still full code with full scope of care and HD.  Renal navigator checking for HD unit in the area that will allow a chairside sitter, so family can sit with patient during dialysis session.  Subjective: Seen and examined earlier this morning.  No major events overnight of this morning.  No complaints but not a reliable historian.  She is awake but only oriented to self.  Follows commands.  Objective: Vitals:   05/08/21 1500 05/08/21 2100 05/09/21 0400 05/09/21 1527  BP: 136/76 115/81 103/74 105/78  Pulse: (!) 101 100 (!) 102 97  Resp: 18 20 16 20   Temp: 98.8 F (37.1 C) 98.6 F (37 C) 99 F (37.2 C) 98.8 F (37.1 C)  TempSrc: Oral Axillary Axillary Oral  SpO2: 100% 98% 98%   Height:        Intake/Output Summary (Last 24 hours) at 05/09/2021 1539 Last data filed at 05/08/2021 1807 Gross per 24 hour  Intake 60 ml  Output --  Net 60 ml   Filed Weights     Examination:  GENERAL: No apparent distress.  Nontoxic. HEENT: MMM.  Vision and hearing grossly intact.  NECK: Supple.  No apparent JVD.  RESP: On RA.  No IWOB.  Fair aeration bilaterally. CVS:  RRR. Heart sounds normal.  ABD/GI/GU: BS+. Abd soft, NTND.  MSK/EXT:  Moves extremities. No apparent deformity. No edema.  SKIN: no apparent skin lesion or wound NEURO: Awake but not alert.  Oriented only to self.  She thinks she is at her son's home.  No facial asymmetry.  PERRL.  Does not seem to move her left side much but difficult exam due to mental status.Marland Kitchen  PSYCH: Calm. Normal affect.   Procedures:  Hemodialysis  Microbiology summarized: [id number] and influenza PCR nonreactive.  Assessment & Plan: Major cognitive impairment/possible acute metabolic encephalopathy-initially felt to be due to uremia from missed HD but no improvement with dialysis.  She could have vascular dementia. CTH without acute finding but chronic right MCA CVA.  Other encephalopathy work-up including VBG, TSH, ammonia, B12 and RPR unrevealing.  Patient is also on clonidine patch that was discontinued since admission.  She is also on Keppra which is renally dosed but could still cause some degree of encephalopathy.  She is only oriented to self.  -Reorientation and delirium precautions -Avoid sedating medications  ESRD on HD MWF-Seems to be tolerating HD in a chair but requires sitter.   Bone  mineral disorder -Renal navigator checking for HD unit in the area that will allow a chair side sitter.  -Palliative medicine following as well.  Streptococcus group G bacteremia-diagnosed prior hospitalization.  Family declined TTE at that time.  Plan was IV ampicillin until 06/01/2021 that seems to have changed to IV vancomycin with HD. -Continue IV vancomycin with HD  Anemia of chronic kidney disease: H&H stable. Recent Labs    04/28/21 1513 04/29/21 0139 04/30/21 0132 05/01/21 0058 05/02/21 0034 05/03/21 0048  05/04/21 0534 05/06/21 0117 05/07/21 0101 05/08/21 0445  HGB 10.2* 10.2* 9.4* 8.8* 8.8* 9.5* 9.6* 9.1* 7.9* 8.6*  -ESA and IV iron per nephrology  Essential hypertension: On metoprolol and clonidine patch at home.  Clonidine d/cd earlier -Normotensive off home medications.  Hypothyroidism: TSH within normal -Continue Synthroid 112 mcg daily  Controlled NIDDM-2 with ESRD: A1c 5.7%.  CBG within appropriate range. Recent Labs  Lab 05/07/21 1214 05/07/21 1702 05/07/21 2048 05/08/21 0738 05/08/21 1634  GLUCAP 86 107* 92 83 91  -Discontinue CBG monitoring and SSI.  History of seizures: Seems a stable. -Continue Keppra 500 mg daily and Keppra 250 mg three times weekly  History of right MCA CVA with residual left hemiparesis-CT head without acute finding.  -Continue home meds  Pressure injury Mid buttocks, present on admission.  Goal of care counseling:  6/3-patient with multiple comorbidities as above.  No significant improvement in mental status despite all interventions.  Poor long-term prognosis.  Still full code which I believe would pose more harm than benefit.  Patient is followed by [facility]. I had extensive discussion with patient's son, [name] over the phone.  We discussed about the pros and cons of CPR and intubation.  He asked for my opinion and I recommended DNR and DNI but he struggled to make a decision, and wanted to continue full code. -Palliative medicine following as well.  Nutrition Body mass index is 22.34 kg/m. Nutrition Problem: Increased nutrient needs Etiology: chronic illness (ESRD on HD) Signs/Symptoms: estimated needs Interventions: Nepro shake,MVI,Prostat Pressure Injury 04/29/21 Buttocks Mid Stage 2 -  Partial thickness loss of dermis presenting as a shallow open injury with a red, pink wound bed without slough. (Active)  04/29/21 0045  Location: Buttocks  Location Orientation: Mid  Staging: Stage 2 -  Partial thickness loss of dermis  presenting as a shallow open injury with a red, pink wound bed without slough.  Wound Description (Comments):   Present on Admission: Yes   DVT prophylaxis:  heparin injection 5,000 Units Start: 04/30/21 2200  Code Status: Full code-confirmed Family Communication: Updated patient's son over the phone on 6/3 Level of care: Telemetry Cardiac Status is: Inpatient  Remains inpatient appropriate because:Altered mental status and Inpatient level of care appropriate due to severity of illness .  Renal navigator searching for HD unit that can accept with chairside sitter   Dispo: The patient is from: Home              Anticipated d/c is to: Home              Patient currently is medically stable to d/c.   Difficult to place patient No       Consultants:  Palliative medicine Nephrology   Sch Meds:  Scheduled Meds: . (feeding supplement) PROSource Plus  30 mL Oral TID BM  . Chlorhexidine Gluconate Cloth  6 each Topical Q0600  . Chlorhexidine Gluconate Cloth  6 each Topical Q0600  . darbepoetin (ARANESP) injection -  DIALYSIS  25 mcg Intravenous Q Mon-HD  . feeding supplement (NEPRO CARB STEADY)  237 mL Oral BID BM  . heparin  5,000 Units Subcutaneous Q8H  . latanoprost  1 drop Both Eyes QHS  . letrozole  2.5 mg Oral Daily  . levETIRAcetam  500 mg Oral Q1500   And  . levETIRAcetam  250 mg Oral Once per day on Mon Wed Fri  . levothyroxine  112 mcg Oral QAC breakfast  . multivitamin  1 tablet Oral QHS  . vancomycin  500 mg Intravenous Q M,W,F-HD   Continuous Infusions: PRN Meds:.acetaminophen  Antimicrobials: Anti-infectives (From admission, onward)   Start     Dose/Rate Route Frequency Ordered Stop   05/07/21 1200  vancomycin (VANCOREADY) IVPB 500 mg/100 mL        500 mg 100 mL/hr over 60 Minutes Intravenous  Once 05/07/21 0803 05/07/21 1229   05/06/21 1200  vancomycin (VANCOCIN) IVPB 500 mg/100 ml premix        500 mg 100 mL/hr over 60 Minutes Intravenous Every M-W-F  (Hemodialysis) 04/30/21 0727 06/01/21 2359   05/04/21 1800  vancomycin (VANCOREADY) IVPB 500 mg/100 mL  Status:  Discontinued        500 mg 100 mL/hr over 60 Minutes Intravenous  Once 05/04/21 1524 05/07/21 0803   05/04/21 1609  vancomycin (VANCOCIN) 500-5 MG/100ML-% IVPB       Note to Pharmacy: [name]  : cabinet override      05/04/21 1609 05/04/21 1613   05/01/21 1200  vancomycin (VANCOCIN) IVPB 500 mg/100 ml premix  Status:  Discontinued       Note to Pharmacy: For this week only she is going to receive hd on 5/26 and 5/28 so please give the Vancomycin. FOr next week back to mon wed fri   500 mg 100 mL/hr over 60 Minutes Intravenous Every M-W-F (Hemodialysis) 04/30/21 0723 04/30/21 0726   04/30/21 1800  vancomycin (VANCOCIN) IVPB 500 mg/100 ml premix       Note to Pharmacy: For this week only she is going to receive hd on 5/26 and 5/28 so please give the Vancomycin. FOr next week back to mon wed fri   500 mg 100 mL/hr over 60 Minutes Intravenous Every T-Th-Sa (1800) 04/30/21 0727 05/02/21 1839   04/29/21 1539  vancomycin (VANCOCIN) 500-5 MG/100ML-% IVPB       Note to Pharmacy: [name]  : cabinet override      04/29/21 1539 04/29/21 1727   04/29/21 1200  vancomycin (VANCOREADY) IVPB 500 mg/100 mL  Status:  Discontinued        500 mg 100 mL/hr over 60 Minutes Intravenous Every M-W-F (Hemodialysis) 04/28/21 1812 04/30/21 0723       I have personally reviewed the following labs and images: CBC: Recent Labs  Lab 05/03/21 0048 05/04/21 0534 05/06/21 0117 05/07/21 0101 05/08/21 0445  WBC 8.6 6.5 10.2 10.1 6.8  NEUTROABS 6.4 4.2  --  7.0 4.7  HGB 9.5* 9.6* 9.1* 7.9* 8.6*  HCT 29.4* 30.1* 28.4* 25.2* 27.3*  MCV 89.4 91.2 89.6 91.3 91.6  PLT 229 264 198 197 128*   BMP &GFR Recent Labs  Lab 05/03/21 0048 05/04/21 0534 05/06/21 0117 05/07/21 0101 05/08/21 0445  NA 133* 134* 131* 130* 136  K 3.9 4.0 4.3 4.8 3.7  CL 95* 96* 94* 92* 99  CO2 28 26 24 26  25   GLUCOSE 133* 93 137* 121* 88  BUN 9 22 28* 42* 19  CREATININE 2.86*  4.77* 4.76* 6.30* 4.31*  CALCIUM 8.2* 9.0 9.1 8.6* 8.8*  MG 2.0 2.2  --  2.1 2.1  PHOS  --   --   --  2.7 3.2   Estimated Creatinine Clearance: 8.8 mL/min (A) (by C-G formula based on SCr of 4.31 mg/dL (H)). Liver & Pancreas: Recent Labs  Lab 05/07/21 0101 05/08/21 0445  AST 26 22  ALT 14 16  ALKPHOS 60 74  BILITOT 1.1 0.7  PROT 6.4* 7.1  ALBUMIN 2.4* 2.7*   No results for input(s): LIPASE, AMYLASE in the last 168 hours. Recent Labs  Lab 05/07/21 1859  AMMONIA 26   Diabetic: No results for input(s): HGBA1C in the last 72 hours. Recent Labs  Lab 05/07/21 1214 05/07/21 1702 05/07/21 2048 05/08/21 0738 05/08/21 1634  GLUCAP 86 107* 92 83 91   Cardiac Enzymes: No results for input(s): CKTOTAL, CKMB, CKMBINDEX, TROPONINI in the last 168 hours. No results for input(s): PROBNP in the last 8760 hours. Coagulation Profile: No results for input(s): INR, PROTIME in the last 168 hours. Thyroid Function Tests: Recent Labs    05/08/21 0841  TSH 0.758   Lipid Profile: No results for input(s): CHOL, HDL, LDLCALC, TRIG, CHOLHDL, LDLDIRECT in the last 72 hours. Anemia Panel: Recent Labs    05/07/21 1859  VITAMINB12 532  FOLATE 26.1   Urine analysis:    Component Value Date/Time   COLORURINE YELLOW (A) 04/17/2021 1702   APPEARANCEUR HAZY (A) 04/17/2021 1702   LABSPEC 1.015 04/17/2021 1702   PHURINE 7.0 04/17/2021 1702   GLUCOSEU 150 (A) 04/17/2021 1702   HGBUR NEGATIVE 04/17/2021 1702   BILIRUBINUR NEGATIVE 04/17/2021 1702   KETONESUR 5 (A) 04/17/2021 1702   PROTEINUR >=300 (A) 04/17/2021 1702   NITRITE NEGATIVE 04/17/2021 1702   LEUKOCYTESUR NEGATIVE 04/17/2021 1702   Sepsis Labs: Invalid input(s): PROCALCITONIN, [place]  Microbiology: No results found for this or any previous visit (from the past 240 hour(s)).  Radiology Studies: No results found.  Raef Sprigg T. Hasty  If 7PM-7AM, please contact night-coverage [email or web address] 05/09/2021, 3:39 PM
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
## 2021-05-09 NOTE — Progress Notes (Addendum)
Daily Progress Note   Patient Name: Kirsten Mcmahon       Date: 05/09/2021 DOB: [birth year]  Age: 75 y.o. MRN#: [id number] Attending Physician: Mercy Riding, MD Primary Care Physician: [place] Date: 04/28/2021  Reason for Consultation/Follow-up: Establishing goals of care  Subjective: Chart reviewed.  Received request to call patient's outpatient PCP through [facility], Kirsten Mcmahon.  I called and was able to reach Kirsten Mcmahon.  We discussed Kirsten Mcmahon clinical course and concerns about her ability to continue with dialysis as an outpatient.  We discussed her confusion and family desire to continue with aggressive care.  Her son reports being committed to accompanying her to each dialysis session to serve as a Air cabin crew and ensure she completes dialysis.  There remains concern, however, about the feasibility of this long-term as he also works full-time.  I also called and spoke with Terri Piedra, renal navigator, who is working to find an outpatient center to allow her son to serve his Air cabin crew.  This is not possible at her current clinic of [name].  Family is hopeful that clinic can be found who will permit this.  I saw and examined Kirsten Mcmahon today.  She was sitting in bedside chair no distress.  She remains slow to answer questions, but does answer simple questions appropriately.  She is not really able to tell me much about her medical history and when I mention her son she tells me to call him.  She then stated that she wished to talk with him as well.  I called and spoke with [name] and he again affirmed hope that an outpatient dialysis center could be found that would allow him to come and sit with her so she can continue with dialysis.  He  expressed again being very committed to this and reports he will be at each session with her.  I then gave her the phone so she can continue to talk with him.  Length of Stay: 11  Current Medications: Scheduled Meds:  . (feeding supplement) PROSource Plus  30 mL Oral TID BM  . Chlorhexidine Gluconate Cloth  6 each Topical Q0600  . Chlorhexidine Gluconate Cloth  6 each Topical Q0600  . darbepoetin (ARANESP) injection - DIALYSIS  25 mcg Intravenous Q Mon-HD  . feeding  supplement (NEPRO CARB STEADY)  237 mL Oral BID BM  . heparin  5,000 Units Subcutaneous Q8H  . latanoprost  1 drop Both Eyes QHS  . letrozole  2.5 mg Oral Daily  . levETIRAcetam  500 mg Oral Q1500   And  . levETIRAcetam  250 mg Oral Once per day on Mon Wed Fri  . levothyroxine  112 mcg Oral QAC breakfast  . multivitamin  1 tablet Oral QHS  . vancomycin  500 mg Intravenous Q M,W,F-HD    Continuous Infusions:   PRN Meds: acetaminophen  Physical Exam       General: Alert, awake, in no acute distress. Sitting in bedside chair.  Slow to answer questions. HEENT: No bruits, no goiter, no JVD Heart: Regular rate and rhythm. No murmur appreciated. Lungs: Good air movement, clear Abdomen: Soft, nontender, nondistended, positive bowel sounds.  Ext: No significant edema Skin: Warm and dry    Vital Signs: BP 103/74 (BP Location: Left Arm)   Pulse (!) 102   Temp 99 F (37.2 C) (Axillary)   Resp 16   Ht 5\' 2"  (1.575 m)   Wt 55.4 kg   SpO2 98%   BMI 22.34 kg/m  SpO2: SpO2: 98 % O2 Device: O2 Device: Room Air O2 Flow Rate:    Intake/output summary:   Intake/Output Summary (Last 24 hours) at 05/09/2021 0843 Last data filed at 05/08/2021 1807 Gross per 24 hour  Intake 120 ml  Output 0 ml  Net 120 ml   LBM: Last BM Date: 05/05/21 Baseline Weight: Weight: 57 kg Most recent weight: Weight:  (patient is still in the chair in the room, I will notify night shift about getting her weight tonight)       Palliative  Assessment/Data:    Flowsheet Rows   Flowsheet Row Most Recent Value  Intake Tab   Referral Department Hospitalist  Unit at Time of Referral Med/Surg Unit  Palliative Care Primary Diagnosis Nephrology  Date Notified 05/07/21  Palliative Care Type New Palliative care  Reason for referral Clarify Goals of Care  Date of Admission 04/28/21  Date first seen by Palliative Care 05/07/21  # of days Palliative referral response time 0 Day(s)  # of days IP prior to Palliative referral 9  Clinical Assessment   Palliative Performance Scale Score 30%  Psychosocial & Spiritual Assessment   Palliative Care Outcomes   Patient/Family meeting held? Yes  Who was at the meeting? Patient, son via phone      Patient Active Problem List   Diagnosis Date Noted  . Physical deconditioning 04/30/2021  . Generalized muscle weakness 04/30/2021  . Pressure injury of skin 04/29/2021  . AKI (acute kidney injury) ([place]) 04/28/2021  . Streptococcal sepsis, unspecified ([place]) 04/18/2021  . Streptococcal bacteremia 04/18/2021  . Leukocytosis 04/17/2021  . Fever 04/17/2021  . ESRD (end stage renal disease) ([place])   . Acute kidney injury superimposed on CKD ([place]) 04/09/2021  . ARF (acute renal failure) ([place]) 01/18/2021  . Fall 01/17/2021  . Hypertension   . Hypothyroidism   . Stroke ([place])   . GERD (gastroesophageal reflux disease)   . Anemia in chronic kidney disease (CKD)   . Acute metabolic encephalopathy   . Acute renal failure superimposed on stage 3b chronic kidney disease ([place])   . Type II diabetes mellitus with renal manifestations ([place])   . Abnormal mammogram of left breast 07/30/2018  . Closed displaced oblique fracture of shaft of left humerus 03/14/2018  . Osteopenia 01/20/2018  .  Multiple thyroid nodules 07/13/2017  . Tracheal deviation 07/13/2017  . Malignant neoplasm of upper-outer quadrant of right breast in female, estrogen receptor positive ([place]) 01/20/2017  . Primary vulvar squamous  cell carcinoma ([place]) 01/20/2017    Palliative Care Assessment & Plan   Patient Profile: 75 y.o. female  with past medical history of streptococcal bacteremia, ESRD, GERD, HTN, hypothyroid, DM, HLP, CVA admitted on 04/28/2021 with confusion.  Concern is for uremia secondary to missed HD.  There continues to be concern about her ability to sit in chair for dilaysis with agitation and hypotension that may preclude her from being able to continue outpatient dialysis.  Palliative consulted for goals of care.   Recommendations/Plan:  Full code/full scope treatment  I spoke again with son and family remains invested in continuation of aggressive interventions for as long as possible.  Son is hopeful that a a dialysis center will be found that will allow him to serve as a Air cabin crew for his mother.  While there remains concerns about her ability to tolerate dialysis as an outpatient and his ability to make it to every session (due to the fact he also works full-time), family is invested in plan to try this prior to any other discussion about other potential pathways of care moving forward.  I would recommend outpatient palliative care follow her at time of discharge in order to continue conversation based upon her clinical course once she returns to trying dialysis in the community.  Her [facility] team will continue to engage in these conversations as well.  No other palliative specific recommendations at this point.  We will continue to follow peripherally.  Please call if there are specific needs with which our team can be of assistance.  Goals of Care and Additional Recommendations:  Limitations on Scope of Treatment: Full Scope Treatment  Code Status:    Code Status Orders  (From admission, onward)         Start     Ordered   04/28/21 1836  Full code  Continuous        04/28/21 1839        Code Status History    Date Active Date Inactive Code Status Order ID Comments User Context    04/09/2021 2102 04/24/2021 2217 Full Code [id number]  Elwyn Reach, MD Inpatient   01/17/2021 1921 01/21/2021 2109 Full Code [id number]  Ivor Costa, MD ED   07/15/2017 1640 07/16/2017 2032 Full Code [id number]  Armandina Gemma, MD Inpatient   Advance Care Planning Activity       Prognosis:   Unable to determine as will depend upon how she does with continued outpatient dialysis  Discharge Planning:  To Be Determined  Care plan was discussed with patient, son, renal navigator, PCP Kirsten Mcmahon)  Thank you for allowing the Palliative Medicine Team to assist in the care of this patient.   Total Time 50 minutes Prolonged Time Billed No      Greater than 50%  of this time was spent counseling and coordinating care related to the above assessment and plan.  Micheline Rough, MD  Please contact Palliative Medicine Team phone at [phone] for questions and concerns.

## 2021-05-10 LAB — GLUCOSE, CAPILLARY
Glucose-Capillary: 95 mg/dL (ref 70–99)
Glucose-Capillary: 107 mg/dL — ABNORMAL HIGH (ref 70–99)
Glucose-Capillary: 104 mg/dL — ABNORMAL HIGH (ref 70–99)

## 2021-05-10 MED ORDER — LABETALOL HCL 5 MG/ML IV SOLN: 20.0000 mg | INTRAVENOUS | Status: DC | PRN

## 2021-05-10 MED ORDER — METOPROLOL SUCCINATE ER 50 MG PO TB24
50.0000 mg | ORAL_TABLET | Freq: Every day | ORAL | Status: DC
  Administered 2021-05-10 – 2021-05-14 (×4): 50 mg via ORAL
  Filled 2021-05-10 (×5): qty 1

## 2021-05-10 NOTE — Progress Notes (Signed)
Pharmacy Antibiotic Note  Kirsten Mcmahon is a 75 y.o. female admitted on 04/28/2021 with group B Strep bacteremia.  Pharmacy has been consulted for vancomycin dosing in patient with ESRD - HD MWF. Plan for vancomycin therapy to continue until likely 6/27. Patient has difficulty tolerating HD (last session Fri 6/3)   Plan:  Continue Vancomycin 500 mg IV after HD on HD days  F/u ability to tolerate HD for post HD dosing   Height: 5\' 2"  (157.5 cm) Weight:  (patient is still in the chair in the room, I will notify night shift about getting her weight tonight) IBW/kg (Calculated) : 50.1  Temp (24hrs), Avg:98.4 F (36.9 C), Min:98.1 F (36.7 C), Max:98.8 F (37.1 C)  Recent Labs  Lab 05/04/21 0534 05/06/21 0117 05/07/21 0101 05/08/21 0445  WBC 6.5 10.2 10.1 6.8  CREATININE 4.77* 4.76* 6.30* 4.31*    Estimated Creatinine Clearance: 8.8 mL/min (A) (by C-G formula based on SCr of 4.31 mg/dL (H)).    Allergies  Allergen Reactions  . Contrast Media [Iodinated Diagnostic Agents]     Other reaction(s): NO ALLERGY  . Latex     Other reaction(s): NO ALLERGY  . Shellfish-Derived Products     Other reaction(s): NO ALLERGY  . Levemir [Insulin Detemir] Itching    Wilson Singer, PharmD PGY1 Pharmacy Resident 05/10/2021 8:22 AM
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
## 2021-05-10 NOTE — Progress Notes (Signed)
PROGRESS NOTE    Kirsten Mcmahon  JKD:[id number] DOB: 05/19/1946 DOA: 04/28/2021 PCP: [facility]   Brief Narrative:  75 year old F with history of Streptococcal bacteremia on IV vanc, ESRD on HD MWF, NIDDM-2, CVA with left hemiparesis, HTN, HLD and cognitive deficit presenting with confusion and admitted for encephalopathy felt to be due to uremia in the setting of missed HD.  His BUN was 66 on admission.  However, encephalopathy did not improve after dialysis.  Family reports progressive confusion since her fall 2 months prior.  She was also on clonidine patch which has been discontinued since admission.  CT head without acute finding but old right MCA infarct.  She is on Keppra 500 mg daily with additional 250 mg after HD.  Basic encephalopathy work-up including VBG, ammonia, TSH, B12 and RPR unrevealing.  Palliative medicine consulted.  Still full code with full scope of care and HD.  Renal navigator checking for HD unit in the area that will allow a chairside sitter, so family can sit with patient during dialysis session.  Assessment & Plan:   Principal Problem:   ESRD (end stage renal disease) ([place]) Active Problems:   Hypertension   Hypothyroidism   Type II diabetes mellitus with renal manifestations ([place])   Streptococcal bacteremia   Pressure injury of skin   Physical deconditioning   Generalized muscle weakness   Major cognitive impairment/possible acute metabolic encephalopathy-initially felt to be due to uremia from missed HD but no improvement with dialysis.  She could have vascular dementia. CTH without acute finding but chronic right MCA CVA.  Other encephalopathy work-up including VBG, TSH, ammonia, B12 and RPR unrevealing.  Patient was also on clonidine patch that was discontinued since admission.  She is also on Keppra which is renally dosed but could still cause some degree of encephalopathy but I highly doubt it since she has been on it for very long time.   She is only oriented to self.  -Reorientation and delirium precautions -Avoid sedating medications.  Today on my evaluation, she was mostly alert and oriented x2   ESRD on HD MWF-Seems to be tolerating HD in a chair but requires sitter.  Bone mineral disorder -Renal navigator checking for HD unit in the area that will allow a chair side sitter. -Palliative medicine following as well.  Streptococcus group G bacteremia-diagnosed prior hospitalization.  Family declined TTE at that time.  Plan was IV ampicillin until 06/01/2021 that seems to have changed to IV vancomycin with HD. -Continue IV vancomycin with HD  Anemia of chronic kidney disease: H&H stable.  -ESA and IV iron per nephrology  Essential hypertension: On metoprolol and clonidine patch at home.  Clonidine d/cd earlier.  Blood pressure slightly elevated and slight tachycardia as well.  Resume Toprol-XL 50 mg p.o. daily.  Hypothyroidism: TSH within normal -Continue Synthroid 112 mcg daily  Controlled NIDDM-2 with ESRD: A1c 5.7%.  CBG within appropriate range.  CBG monitoring discontinued.  History of seizures: Seems a stable. -Continue Keppra 500 mg daily and Keppra 250 mg three times weekly  History of right MCA CVA with residual left hemiparesis-CT head without acute finding. Continue home meds  Pressure injury Mid buttocks, present on admission.  Goal of care counseling:   Poor long-term prognosis.  Seen by palliative care.  Still full code which I believe would pose more harm than benefit.  Patient is followed by [facility].  Previous hospitalist had extensive discussion with patient's son, [name] over the phone.  They discussed about the pros and cons of CPR and intubation.  He asked physicians opinion and he recommended DNR and DNI but he struggled to make a decision, and wanted to continue full code.  Nutrition Body mass index is 22.34 kg/m. Nutrition Problem: Increased nutrient needs Etiology:  chronic illness (ESRD on HD) Signs/Symptoms: estimated needs Interventions: Nepro shake,MVI,Prostat Pressure Injury 04/29/21 Buttocks Mid Stage 2 -  Partial thickness loss of dermis presenting as a shallow open injury with a red, pink wound bed without slough. (Active) 04/29/21 0045 Location: Buttocks Location Orientation: Mid Staging: Stage 2 -  Partial thickness loss of dermis presenting as a shallow open injury with a red, pink wound bed without slough. Wound Description (Comments):  Present on Admission: Yes  DVT prophylaxis: heparin injection 5,000 Units Start: 04/30/21 2200   Code Status: Full Code  Family Communication:  None present at bedside.  Discussed with son [name] over the phone Status is: Inpatient  Remains inpatient appropriate because:Unsafe d/c plan   Dispo: The patient is from: Home              Anticipated d/c is to: Home              Patient currently is not medically stable to d/c.   Difficult to place patient No        Estimated body mass index is 22.34 kg/m as calculated from the following:   Height as of this encounter: 5\' 2"  (1.575 m).   Weight as of this encounter: 55.4 kg.  Pressure Injury 04/29/21 Buttocks Mid Stage 2 -  Partial thickness loss of dermis presenting as a shallow open injury with a red, pink wound bed without slough. (Active)  04/29/21 0045  Location: Buttocks  Location Orientation: Mid  Staging: Stage 2 -  Partial thickness loss of dermis presenting as a shallow open injury with a red, pink wound bed without slough.  Wound Description (Comments):   Present on Admission: Yes     Nutritional status:  Nutrition Problem: Increased nutrient needs Etiology: chronic illness (ESRD on HD)   Signs/Symptoms: estimated needs   Interventions: Nepro shake,MVI,Prostat    Consultants:   Nephrology  Palliative care  Procedures:   None  Antimicrobials:  Anti-infectives (From admission, onward)   Start     Dose/Rate  Route Frequency Ordered Stop   05/07/21 1200  vancomycin (VANCOREADY) IVPB 500 mg/100 mL        500 mg 100 mL/hr over 60 Minutes Intravenous  Once 05/07/21 0803 05/07/21 1229   05/06/21 1200  vancomycin (VANCOCIN) IVPB 500 mg/100 ml premix        500 mg 100 mL/hr over 60 Minutes Intravenous Every M-W-F (Hemodialysis) 04/30/21 0727 06/01/21 2359   05/04/21 1800  vancomycin (VANCOREADY) IVPB 500 mg/100 mL  Status:  Discontinued        500 mg 100 mL/hr over 60 Minutes Intravenous  Once 05/04/21 1524 05/07/21 0803   05/04/21 1609  vancomycin (VANCOCIN) 500-5 MG/100ML-% IVPB       Note to Pharmacy: [name]  : cabinet override      05/04/21 1609 05/04/21 1613   05/01/21 1200  vancomycin (VANCOCIN) IVPB 500 mg/100 ml premix  Status:  Discontinued       Note to Pharmacy: For this week only she is going to receive hd on 5/26 and 5/28 so please give the Vancomycin. FOr next week back to mon wed fri   500 mg 100 mL/hr over 60 Minutes Intravenous  Every M-W-F (Hemodialysis) 04/30/21 0723 04/30/21 0726   04/30/21 1800  vancomycin (VANCOCIN) IVPB 500 mg/100 ml premix       Note to Pharmacy: For this week only she is going to receive hd on 5/26 and 5/28 so please give the Vancomycin. FOr next week back to mon wed fri   500 mg 100 mL/hr over 60 Minutes Intravenous Every T-Th-Sa (1800) 04/30/21 0727 05/02/21 1839   04/29/21 1539  vancomycin (VANCOCIN) 500-5 MG/100ML-% IVPB       Note to Pharmacy: [name]  : cabinet override      04/29/21 1539 04/29/21 1727   04/29/21 1200  vancomycin (VANCOREADY) IVPB 500 mg/100 mL  Status:  Discontinued        500 mg 100 mL/hr over 60 Minutes Intravenous Every M-W-F (Hemodialysis) 04/28/21 1812 04/30/21 8416         Subjective: Patient seen and examined.  Partially alert.  Mostly oriented.  Denied any complaint.  Objective: Vitals:   05/09/21 0400 05/09/21 1527 05/09/21 2037 05/10/21 0400  BP: 103/74 105/78 (!) 176/89 (!) 159/80  Pulse:  (!) 102 97 (!) 103 100  Resp: 16 20  17   Temp: 99 F (37.2 C) 98.8 F (37.1 C) 98.2 F (36.8 C) 98.1 F (36.7 C)  TempSrc: Axillary Oral Oral Axillary  SpO2: 98%  97% 98%  Height:        Intake/Output Summary (Last 24 hours) at 05/10/2021 1329 Last data filed at 05/10/2021 0100 Gross per 24 hour  Intake 120 ml  Output --  Net 120 ml   Filed Weights    Examination:  General exam: Appears calm and comfortable  Respiratory system: Clear to auscultation. Respiratory effort normal. Cardiovascular system: S1 & S2 heard, RRR. No JVD, murmurs, rubs, gallops or clicks. No pedal edema. Gastrointestinal system: Abdomen is nondistended, soft and nontender. No organomegaly or masses felt. Normal bowel sounds heard. Central nervous system: Alert and oriented x2. No focal neurological deficits. Extremities: Symmetric 5 x 5 power. Skin: No rashes, lesions or ulcers     Data Reviewed: I have personally reviewed following labs and imaging studies  CBC: Recent Labs  Lab 05/04/21 0534 05/06/21 0117 05/07/21 0101 05/08/21 0445  WBC 6.5 10.2 10.1 6.8  NEUTROABS 4.2  --  7.0 4.7  HGB 9.6* 9.1* 7.9* 8.6*  HCT 30.1* 28.4* 25.2* 27.3*  MCV 91.2 89.6 91.3 91.6  PLT 264 198 197 606*   Basic Metabolic Panel: Recent Labs  Lab 05/04/21 0534 05/06/21 0117 05/07/21 0101 05/08/21 0445  NA 134* 131* 130* 136  K 4.0 4.3 4.8 3.7  CL 96* 94* 92* 99  CO2 26 24 26 25   GLUCOSE 93 137* 121* 88  BUN 22 28* 42* 19  CREATININE 4.77* 4.76* 6.30* 4.31*  CALCIUM 9.0 9.1 8.6* 8.8*  MG 2.2  --  2.1 2.1  PHOS  --   --  2.7 3.2   GFR: Estimated Creatinine Clearance: 8.8 mL/min (A) (by C-G formula based on SCr of 4.31 mg/dL (H)). Liver Function Tests: Recent Labs  Lab 05/07/21 0101 05/08/21 0445  AST 26 22  ALT 14 16  ALKPHOS 60 74  BILITOT 1.1 0.7  PROT 6.4* 7.1  ALBUMIN 2.4* 2.7*   No results for input(s): LIPASE, AMYLASE in the last 168 hours. Recent Labs  Lab 05/07/21 1859  AMMONIA  26   Coagulation Profile: No results for input(s): INR, PROTIME in the last 168 hours. Cardiac Enzymes: No results for input(s): CKTOTAL,  CKMB, CKMBINDEX, TROPONINI in the last 168 hours. BNP (last 3 results) No results for input(s): PROBNP in the last 8760 hours. HbA1C: No results for input(s): HGBA1C in the last 72 hours. CBG: Recent Labs  Lab 05/08/21 0738 05/08/21 1634 05/09/21 1637 05/10/21 0812 05/10/21 1201  GLUCAP 83 91 100* 95 107*   Lipid Profile: No results for input(s): CHOL, HDL, LDLCALC, TRIG, CHOLHDL, LDLDIRECT in the last 72 hours. Thyroid Function Tests: Recent Labs    05/08/21 0841  TSH 0.758   Anemia Panel: Recent Labs    05/07/21 1859  [id number] 694  FOLATE 26.1   Sepsis Labs: No results for input(s): PROCALCITON, LATICACIDVEN in the last 168 hours.  No results found for this or any previous visit (from the past 240 hour(s)).    Radiology Studies: No results found.  Scheduled Meds: . (feeding supplement) PROSource Plus  30 mL Oral TID BM  . Chlorhexidine Gluconate Cloth  6 each Topical Q0600  . Chlorhexidine Gluconate Cloth  6 each Topical Q0600  . darbepoetin (ARANESP) injection - DIALYSIS  25 mcg Intravenous Q Mon-HD  . feeding supplement (NEPRO CARB STEADY)  237 mL Oral BID BM  . heparin  5,000 Units Subcutaneous Q8H  . latanoprost  1 drop Both Eyes QHS  . letrozole  2.5 mg Oral Daily  . levETIRAcetam  500 mg Oral Q1500   And  . levETIRAcetam  250 mg Oral Once per day on Mon Wed Fri  . levothyroxine  112 mcg Oral QAC breakfast  . multivitamin  1 tablet Oral QHS  . vancomycin  500 mg Intravenous Q M,W,F-HD   Continuous Infusions:   LOS: 12 days   Time spent: 34-minute   Darliss Cheney, MD [facility]  05/10/2021, 1:29 PM   How to contact the [facility] Attending or Consulting provider [place] or covering provider during after hours [place], for this patient?  1. Check the care team in [facility] and look for a) attending/consulting  TR[facility] provider listed and b) the [facility] team listed. Page or secure chat [phone]. 2. Log into [email or web address] and use [place]'s universal password to access. If you do not have the password, please contact the hospital operator. 3. Locate the [facility] provider you are looking for under [facility] and page to a number that you can be directly reached. 4. If you still have difficulty reaching the provider, please page the [facility] (Director on Call) for the Hospitalists listed on amion for assistance.

## 2021-05-11 LAB — GLUCOSE, CAPILLARY: Glucose-Capillary: 144 mg/dL — ABNORMAL HIGH (ref 70–99)

## 2021-05-11 MED ORDER — PENTAFLUOROPROP-TETRAFLUOROETH EX AERO: 1.0000 "application " | INHALATION_SPRAY | CUTANEOUS | Status: DC | PRN

## 2021-05-11 MED ORDER — LIDOCAINE-PRILOCAINE 2.5-2.5 % EX CREA: 1.0000 "application " | TOPICAL_CREAM | CUTANEOUS | Status: DC | PRN

## 2021-05-11 MED ORDER — SODIUM CHLORIDE 0.9 % IV SOLN: 100.0000 mL | INTRAVENOUS | Status: DC | PRN

## 2021-05-11 MED ORDER — HEPARIN SODIUM (PORCINE) 1000 UNIT/ML IJ SOLN
INTRAMUSCULAR | Status: AC
  Administered 2021-05-11: 1000 [IU] via INTRAVENOUS_CENTRAL
  Filled 2021-05-11: qty 4

## 2021-05-11 MED ORDER — VANCOMYCIN HCL IN DEXTROSE 500-5 MG/100ML-% IV SOLN
INTRAVENOUS | Status: AC
  Administered 2021-05-11: 500 mg via INTRAVENOUS
  Filled 2021-05-11: qty 100

## 2021-05-11 MED ORDER — HEPARIN SODIUM (PORCINE) 1000 UNIT/ML DIALYSIS: 1000.0000 [IU] | INTRAMUSCULAR | Status: DC | PRN

## 2021-05-11 MED ORDER — ALTEPLASE 2 MG IJ SOLR: 2.0000 mg | Freq: Once | INTRAMUSCULAR | Status: DC | PRN

## 2021-05-11 MED ORDER — DARBEPOETIN ALFA 25 MCG/0.42ML IJ SOSY
PREFILLED_SYRINGE | INTRAMUSCULAR | Status: AC
  Administered 2021-05-11: 25 ug via INTRAVENOUS
  Filled 2021-05-11: qty 0.42

## 2021-05-11 MED ORDER — LIDOCAINE HCL (PF) 1 % IJ SOLN: 5.0000 mL | INTRAMUSCULAR | Status: DC | PRN

## 2021-05-11 NOTE — Progress Notes (Signed)
   05/11/21 2018  Assess: MEWS Score  Temp 99.1 F (37.3 C)  BP 100/69  Pulse Rate (!) 118  ECG Heart Rate (!) 119  Resp 20  SpO2 99 %  O2 Device Room Air  Assess: MEWS Score  MEWS Temp 0  MEWS Systolic 1  MEWS Pulse 2  MEWS RR 0  MEWS LOC 0  MEWS Score 3  MEWS Score Color Yellow  Assess: if the MEWS score is Yellow or Red  Were vital signs taken at a resting state? Yes  Focused Assessment No change from prior assessment  Early Detection of Sepsis Score *See Row Information* Medium  MEWS guidelines implemented *See Row Information* No, other (Comment)  Treat  MEWS Interventions Administered scheduled meds/treatments  Breathing 1  Negative Vocalization 1  Facial Expression 1  Body Language 2  Consolability 1  PAINAD Score 6  Neuro symptoms relieved by Rest  Escalate  MEWS: Escalate Yellow: discuss with charge nurse/RN and consider discussing with provider and RRT  Notify: Charge Nurse/RN  Name of Charge Nurse/RN Notified [place]  Date Charge Nurse/RN Notified 05/11/21  Time Charge Nurse/RN Notified [phone]
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
## 2021-05-11 NOTE — Progress Notes (Signed)
Physical Therapy Treatment Patient Details Name: Kirsten Mcmahon MRN: [id number] DOB: 1946-11-18 Today's Date: 05/11/2021    History of Present Illness 75yo female admitted 04/28/21 from an HD center after refusing dialysis. Also thought to have acute metabolic encepthalopathy. PMH breast CA, CVA with L residual hemiplegia, seizures, HTN, R fronto-temporal craniotomy    PT Comments    Patient received in bed, pleasant but still very confused and oriented to self only. Attempted bed mobility but she was physically resistive and often will cry out for therapist to stop due to pain or anxiety thereof. Able to perform some ROM exercises with BLE and a couple of active activities with R LE/UE, but very inconsistent command following. Will randomly become physically resistive to tasks. Question if this is her new baseline. Repositioned her as best I could and floated heels- will kindly request profores to prevent skin breakdown. Left in bed with all needs met, bed alarm active.    Follow Up Recommendations  Other (comment);Supervision/Assistance - 24 hour (PACE participant; would certainly recommend in home PACE services if possible)     Equipment Recommendations  Hospital bed;Wheelchair (measurements PT);Wheelchair cushion (measurements PT);Other (comment) (hoyer lift and pads)    Recommendations for Other Services       Precautions / Restrictions Precautions Precautions: Fall;Other (comment) Precaution Comments: R perm-cath; history of R fronto-temporal craniectomy with musculocutaneous flap over site Restrictions Weight Bearing Restrictions: No    Mobility  Bed Mobility               General bed mobility comments: physically resistant    Transfers                 General transfer comment: unable  Ambulation/Gait             General Gait Details: unable   Stairs             Wheelchair Mobility    Modified Rankin (Stroke Patients Only)        Balance                                            Cognition Arousal/Alertness: Awake/alert Behavior During Therapy: Flat affect Overall Cognitive Status: No family/caregiver present to determine baseline cognitive functioning                                 General Comments: oriented to self only, still only following simple cues inconsistently. Fearful of pain and can be physically resistive.      Exercises      General Comments General comments (skin integrity, edema, etc.): unable to get to EOB today- per past charting, balance generally fair to poor      Pertinent Vitals/Pain Pain Assessment: Faces Faces Pain Scale: Hurts a little bit Pain Location: with ROM/movement, generalized Pain Descriptors / Indicators: Grimacing Pain Intervention(s): Limited activity within patient's tolerance;Monitored during session    Home Living                      Prior Function            PT Goals (current goals can now be found in the care plan section) Acute Rehab PT Goals Patient Stated Goal: none stated PT Goal Formulation: Patient unable to participate in goal setting Time  For Goal Achievement: 05/15/21 Potential to Achieve Goals: Fair Progress towards PT goals: Not progressing toward goals - comment (limited by cognition, participation)    Frequency    Min 2X/week      PT Plan Current plan remains appropriate    Co-evaluation              AM-PAC PT "6 Clicks" Mobility   Outcome Measure  Help needed turning from your back to your side while in a flat bed without using bedrails?: Total Help needed moving from lying on your back to sitting on the side of a flat bed without using bedrails?: Total Help needed moving to and from a bed to a chair (including a wheelchair)?: Total Help needed standing up from a chair using your arms (e.g., wheelchair or bedside chair)?: Total Help needed to walk in hospital room?: Total Help  needed climbing 3-5 steps with a railing? : Total 6 Click Score: 6    End of Session   Activity Tolerance: Patient tolerated treatment well Patient left: in bed;with call bell/phone within reach;with bed alarm set Nurse Communication: Mobility status Hemiplegia - Right/Left: Left Hemiplegia - dominant/non-dominant: Non-dominant Hemiplegia - caused by: Cerebral infarction     Time: 0911-0929 PT Time Calculation (min) (ACUTE ONLY): 18 min  Charges:  $Therapeutic Exercise: 8-22 mins                     Windell Norfolk, DPT, PN1   Supplemental Physical Therapist [place]    Pager [phone] Acute Rehab Office [phone]

## 2021-05-11 NOTE — Progress Notes (Signed)
PROGRESS NOTE    Kirsten Mcmahon  EPP:[id number] DOB: Jun 13, 1946 DOA: 04/28/2021 PCP: [facility]   Brief Narrative:  75 year old F with history of Streptococcal bacteremia on IV vanc, ESRD on HD MWF, NIDDM-2, CVA with left hemiparesis, HTN, HLD and cognitive deficit presenting with confusion and admitted for encephalopathy felt to be due to uremia in the setting of missed HD.  His BUN was 66 on admission.  However, encephalopathy did not improve after dialysis.  Family reports progressive confusion since her fall 2 months prior.  She was also on clonidine patch which has been discontinued since admission.  CT head without acute finding but old right MCA infarct.  She is on Keppra 500 mg daily with additional 250 mg after HD.  Basic encephalopathy work-up including VBG, ammonia, TSH, B12 and RPR unrevealing.  Palliative medicine consulted.  Still full code with full scope of care and HD.  Renal navigator checking for HD unit in the area that will allow a chairside sitter, so family can sit with patient during dialysis session.  Assessment & Plan:   Principal Problem:   ESRD (end stage renal disease) ([place]) Active Problems:   Hypertension   Hypothyroidism   Type II diabetes mellitus with renal manifestations ([place])   Streptococcal bacteremia   Pressure injury of skin   Physical deconditioning   Generalized muscle weakness   Major cognitive impairment/possible acute metabolic encephalopathy-initially felt to be due to uremia from missed HD but no improvement with dialysis.  She could have vascular dementia. CTH without acute finding but chronic right MCA CVA.  Other encephalopathy work-up including VBG, TSH, ammonia, B12 and RPR unrevealing.  Patient was also on clonidine patch that was discontinued since admission.  She is also on Keppra which is renally dosed but could still cause some degree of encephalopathy but I highly doubt it since she has been on it for very long time.   She is only oriented to self.  -Reorientation and delirium precautions -Avoid sedating medications.  Today she was lethargic which she was not yesterday.  She was oriented to only self.  Preferred to keep her eyes closed.  Was very hard to arouse.  Just as a last resort, I will get MRI of the brain today.  ESRD on HD MWF-Seems to be tolerating HD in a chair but requires sitter.  Bone mineral disorder -Renal navigator checking for HD unit in the area that will allow a chair side sitter. -Palliative medicine following as well.  Streptococcus group G bacteremia-diagnosed prior hospitalization.  Family declined TTE at that time.  Plan was IV ampicillin until 06/01/2021 that seems to have changed to IV vancomycin with HD. -Continue IV vancomycin with HD  Anemia of chronic kidney disease: H&H stable.  -ESA and IV iron per nephrology  Essential hypertension: On metoprolol and clonidine patch at home.  Clonidine d/cd earlier.  Blood pressure now better with resumption of Toprol-XL.  Hypothyroidism: TSH within normal -Continue Synthroid 112 mcg daily  Controlled NIDDM-2 with ESRD: A1c 5.7%.  CBG within appropriate range.  CBG monitoring discontinued.  History of seizures: Seems a stable. -Continue Keppra 500 mg daily and Keppra 250 mg three times weekly  History of right MCA CVA with residual left hemiparesis-CT head without acute finding. Continue home meds  Pressure injury Mid buttocks, present on admission.  Goal of care counseling:   Poor long-term prognosis.  Seen by palliative care.  Still full code which I believe would pose more harm  than benefit.  Patient is followed by [facility].  Previous hospitalist had extensive discussion with patient's son, [name] over the phone.  They discussed about the pros and cons of CPR and intubation.  He asked physicians opinion and he recommended DNR and DNI but he struggled to make a decision, and wanted to continue full  code.  Nutrition Body mass index is 22.34 kg/m. Nutrition Problem: Increased nutrient needs Etiology: chronic illness (ESRD on HD) Signs/Symptoms: estimated needs Interventions: Nepro shake,MVI,Prostat Pressure Injury 04/29/21 Buttocks Mid Stage 2 -  Partial thickness loss of dermis presenting as a shallow open injury with a red, pink wound bed without slough. (Active) 04/29/21 0045 Location: Buttocks Location Orientation: Mid Staging: Stage 2 -  Partial thickness loss of dermis presenting as a shallow open injury with a red, pink wound bed without slough. Wound Description (Comments):  Present on Admission: Yes  DVT prophylaxis: heparin injection 5,000 Units Start: 04/30/21 2200   Code Status: Full Code  Family Communication:  None present at bedside.  Discussed with son [name] over the phone Status is: Inpatient  Remains inpatient appropriate because:Unsafe d/c plan   Dispo: The patient is from: Home              Anticipated d/c is to: Home              Patient currently is not medically stable to d/c.   Difficult to place patient No        Estimated body mass index is 22.34 kg/m as calculated from the following:   Height as of this encounter: 5\' 2"  (1.575 m).   Weight as of this encounter: 55.4 kg.  Pressure Injury 04/29/21 Buttocks Mid Stage 2 -  Partial thickness loss of dermis presenting as a shallow open injury with a red, pink wound bed without slough. (Active)  04/29/21 0045  Location: Buttocks  Location Orientation: Mid  Staging: Stage 2 -  Partial thickness loss of dermis presenting as a shallow open injury with a red, pink wound bed without slough.  Wound Description (Comments):   Present on Admission: Yes     Nutritional status:  Nutrition Problem: Increased nutrient needs Etiology: chronic illness (ESRD on HD)   Signs/Symptoms: estimated needs   Interventions: Nepro shake,MVI,Prostat    Consultants:   Nephrology  Palliative  care  Procedures:   None  Antimicrobials:  Anti-infectives (From admission, onward)   Start     Dose/Rate Route Frequency Ordered Stop   05/07/21 1200  vancomycin (VANCOREADY) IVPB 500 mg/100 mL        500 mg 100 mL/hr over 60 Minutes Intravenous  Once 05/07/21 0803 05/07/21 1229   05/06/21 1200  vancomycin (VANCOCIN) IVPB 500 mg/100 ml premix        500 mg 100 mL/hr over 60 Minutes Intravenous Every M-W-F (Hemodialysis) 04/30/21 0727 06/01/21 2359   05/04/21 1800  vancomycin (VANCOREADY) IVPB 500 mg/100 mL  Status:  Discontinued        500 mg 100 mL/hr over 60 Minutes Intravenous  Once 05/04/21 1524 05/07/21 0803   05/04/21 1609  vancomycin (VANCOCIN) 500-5 MG/100ML-% IVPB       Note to Pharmacy: [name]  : cabinet override      05/04/21 1609 05/04/21 1613   05/01/21 1200  vancomycin (VANCOCIN) IVPB 500 mg/100 ml premix  Status:  Discontinued       Note to Pharmacy: For this week only she is going to receive hd on 5/26 and  5/28 so please give the Vancomycin. FOr next week back to mon wed fri   500 mg 100 mL/hr over 60 Minutes Intravenous Every M-W-F (Hemodialysis) 04/30/21 0723 04/30/21 0726   04/30/21 1800  vancomycin (VANCOCIN) IVPB 500 mg/100 ml premix       Note to Pharmacy: For this week only she is going to receive hd on 5/26 and 5/28 so please give the Vancomycin. FOr next week back to mon wed fri   500 mg 100 mL/hr over 60 Minutes Intravenous Every T-Th-Sa (1800) 04/30/21 0727 05/02/21 1839   04/29/21 1539  vancomycin (VANCOCIN) 500-5 MG/100ML-% IVPB       Note to Pharmacy: [name]  : cabinet override      04/29/21 1539 04/29/21 1727   04/29/21 1200  vancomycin (VANCOREADY) IVPB 500 mg/100 mL  Status:  Discontinued        500 mg 100 mL/hr over 60 Minutes Intravenous Every M-W-F (Hemodialysis) 04/28/21 1812 04/30/21 6720         Subjective: Seen and examined.  Very lethargic today.  Hard to arouse.  Only oriented to self.  Objective: Vitals:    05/10/21 0400 05/10/21 1446 05/10/21 2039 05/11/21 0400  BP: (!) 159/80 (!) 165/97 107/77 103/80  Pulse: 100 100 96 97  Resp: 17 20 19 15   Temp: 98.1 F (36.7 C) 98.5 F (36.9 C) 98.3 F (36.8 C) 98.1 F (36.7 C)  TempSrc: Axillary Oral Oral Oral  SpO2: 98% 99% 98% 98%  Height:        Intake/Output Summary (Last 24 hours) at 05/11/2021 1117 Last data filed at 05/10/2021 2042 Gross per 24 hour  Intake 120 ml  Output --  Net 120 ml   Filed Weights    Examination:  General exam: Appears very lethargic. Respiratory system: Clear to auscultation. Respiratory effort normal. Cardiovascular system: S1 & S2 heard, RRR. No JVD, murmurs, rubs, gallops or clicks. No pedal edema. Gastrointestinal system: Abdomen is nondistended, soft and nontender. No organomegaly or masses felt. Normal bowel sounds heard. Central nervous system: Lethargic and oriented to self only.   Data Reviewed: I have personally reviewed following labs and imaging studies  CBC: Recent Labs  Lab 05/06/21 0117 05/07/21 0101 05/08/21 0445  WBC 10.2 10.1 6.8  NEUTROABS  --  7.0 4.7  HGB 9.1* 7.9* 8.6*  HCT 28.4* 25.2* 27.3*  MCV 89.6 91.3 91.6  PLT 198 197 947*   Basic Metabolic Panel: Recent Labs  Lab 05/06/21 0117 05/07/21 0101 05/08/21 0445  NA 131* 130* 136  K 4.3 4.8 3.7  CL 94* 92* 99  CO2 24 26 25   GLUCOSE 137* 121* 88  BUN 28* 42* 19  CREATININE 4.76* 6.30* 4.31*  CALCIUM 9.1 8.6* 8.8*  MG  --  2.1 2.1  PHOS  --  2.7 3.2   GFR: Estimated Creatinine Clearance: 8.8 mL/min (A) (by C-G formula based on SCr of 4.31 mg/dL (H)). Liver Function Tests: Recent Labs  Lab 05/07/21 0101 05/08/21 0445  AST 26 22  ALT 14 16  ALKPHOS 60 74  BILITOT 1.1 0.7  PROT 6.4* 7.1  ALBUMIN 2.4* 2.7*   No results for input(s): LIPASE, AMYLASE in the last 168 hours. Recent Labs  Lab 05/07/21 1859  AMMONIA 26   Coagulation Profile: No results for input(s): INR, PROTIME in the last 168  hours. Cardiac Enzymes: No results for input(s): CKTOTAL, CKMB, CKMBINDEX, TROPONINI in the last 168 hours. BNP (last 3 results) No results for input(s):  PROBNP in the last 8760 hours. HbA1C: No results for input(s): HGBA1C in the last 72 hours. CBG: Recent Labs  Lab 05/08/21 1634 05/09/21 1637 05/10/21 0812 05/10/21 1201 05/10/21 1718  GLUCAP 91 100* 95 107* 104*   Lipid Profile: No results for input(s): CHOL, HDL, LDLCALC, TRIG, CHOLHDL, LDLDIRECT in the last 72 hours. Thyroid Function Tests: No results for input(s): TSH, T4TOTAL, FREET4, T3FREE, THYROIDAB in the last 72 hours. Anemia Panel: No results for input(s): VITAMINB12, FOLATE, FERRITIN, TIBC, IRON, RETICCTPCT in the last 72 hours. Sepsis Labs: No results for input(s): PROCALCITON, LATICACIDVEN in the last 168 hours.  No results found for this or any previous visit (from the past 240 hour(s)).    Radiology Studies: No results found.  Scheduled Meds: . (feeding supplement) PROSource Plus  30 mL Oral TID BM  . Chlorhexidine Gluconate Cloth  6 each Topical Q0600  . Chlorhexidine Gluconate Cloth  6 each Topical Q0600  . darbepoetin (ARANESP) injection - DIALYSIS  25 mcg Intravenous Q Mon-HD  . feeding supplement (NEPRO CARB STEADY)  237 mL Oral BID BM  . heparin  5,000 Units Subcutaneous Q8H  . latanoprost  1 drop Both Eyes QHS  . letrozole  2.5 mg Oral Daily  . levETIRAcetam  500 mg Oral Q1500   And  . levETIRAcetam  250 mg Oral Once per day on Mon Wed Fri  . levothyroxine  112 mcg Oral QAC breakfast  . metoprolol succinate  50 mg Oral Daily  . multivitamin  1 tablet Oral QHS  . vancomycin  500 mg Intravenous Q M,W,F-HD   Continuous Infusions:   LOS: 13 days   Time spent: 28-minute   Darliss Cheney, MD [facility]  05/11/2021, 11:17 AM   How to contact the [facility] Attending or Consulting provider [place] or covering provider during after hours [place], for this patient?  1. Check the care team in [facility]  and look for a) attending/consulting [facility] provider listed and b) the [facility] team listed. Page or secure chat [phone]. 2. Log into [email or web address] and use [place] universal password to access. If you do not have the password, please contact the hospital operator. 3. Locate the [facility] provider you are looking for under [facility] and page to a number that you can be directly reached. 4. If you still have difficulty reaching the provider, please page the [facility] (Director on Call) for the Hospitalists listed on amion for assistance.

## 2021-05-11 NOTE — Progress Notes (Signed)
Orthopedic Tech Progress Note Patient Details:  Kirsten Mcmahon 1945/12/08 [id number] MD stated "she did not order anything for patient." Patient ID: [name], female   DOB: 29-Jan-1946, 75 y.o.   MRN: [id number]   [name] 05/11/2021, 10:07 AM

## 2021-05-12 ENCOUNTER — Inpatient Hospital Stay (HOSPITAL_COMMUNITY): Payer: Medicare (Managed Care)

## 2021-05-12 LAB — GLUCOSE, CAPILLARY: Glucose-Capillary: 90 mg/dL (ref 70–99)

## 2021-05-12 IMAGING — MR MR HEAD W/O CM
4 of 6 series · 20 of 48 positions shown · non-contrast
Comparison: [DATE]

CLINICAL DATA: Encephalopathy

EXAM:
MRI HEAD WITHOUT CONTRAST
TECHNIQUE: Multiplanar, multiecho pulse sequences of the brain and surrounding
structures were obtained without intravenous contrast.

[Series 2: DWI · axial · 3.0mm · 0.94mm/px · z∈[-94,+47]mm · 9 of 100 slices shown (1 of 2)]
[im 1/100]
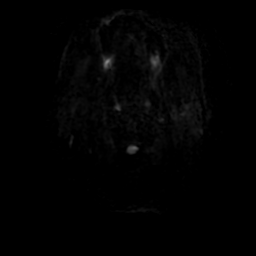
[im 15/100]
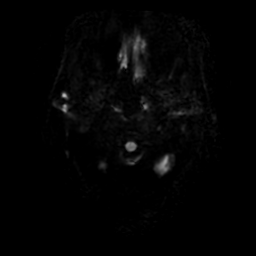
[im 29/100]
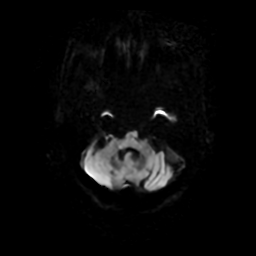
[im 43/100]
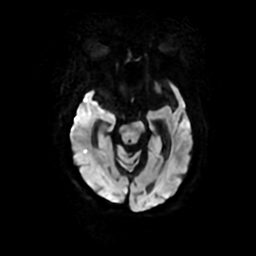
[im 50/100]
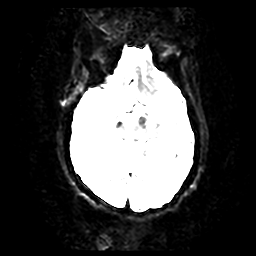
[im 57/100]
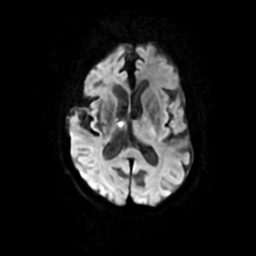
[im 71/100]
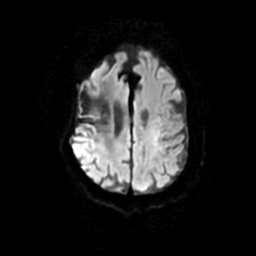
[im 85/100]
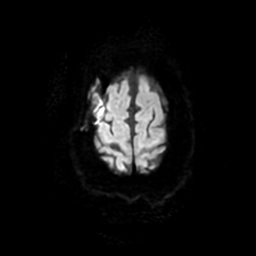
[im 100/100]
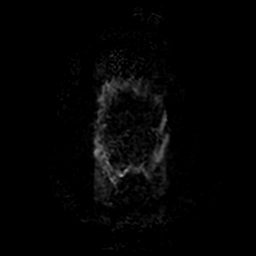

[Series 3: DWI · coronal · 4.0mm · 0.94mm/px · 4 of 84 slices shown (2 of 2)]
[im 1/84]
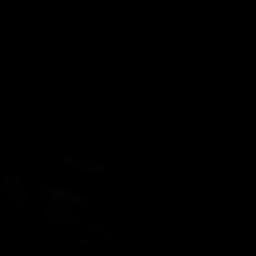
[im 16/84]
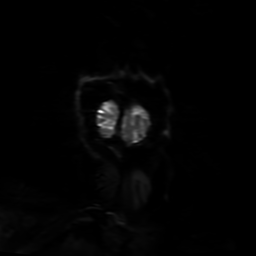
[im 46/84]
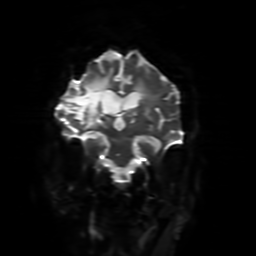
[im 76/84]
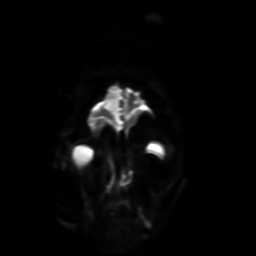

[Series 4: FLAIR · sagittal · 5.0mm · 0.23mm/px · 4 of 24 slices shown]
[im 1/24]
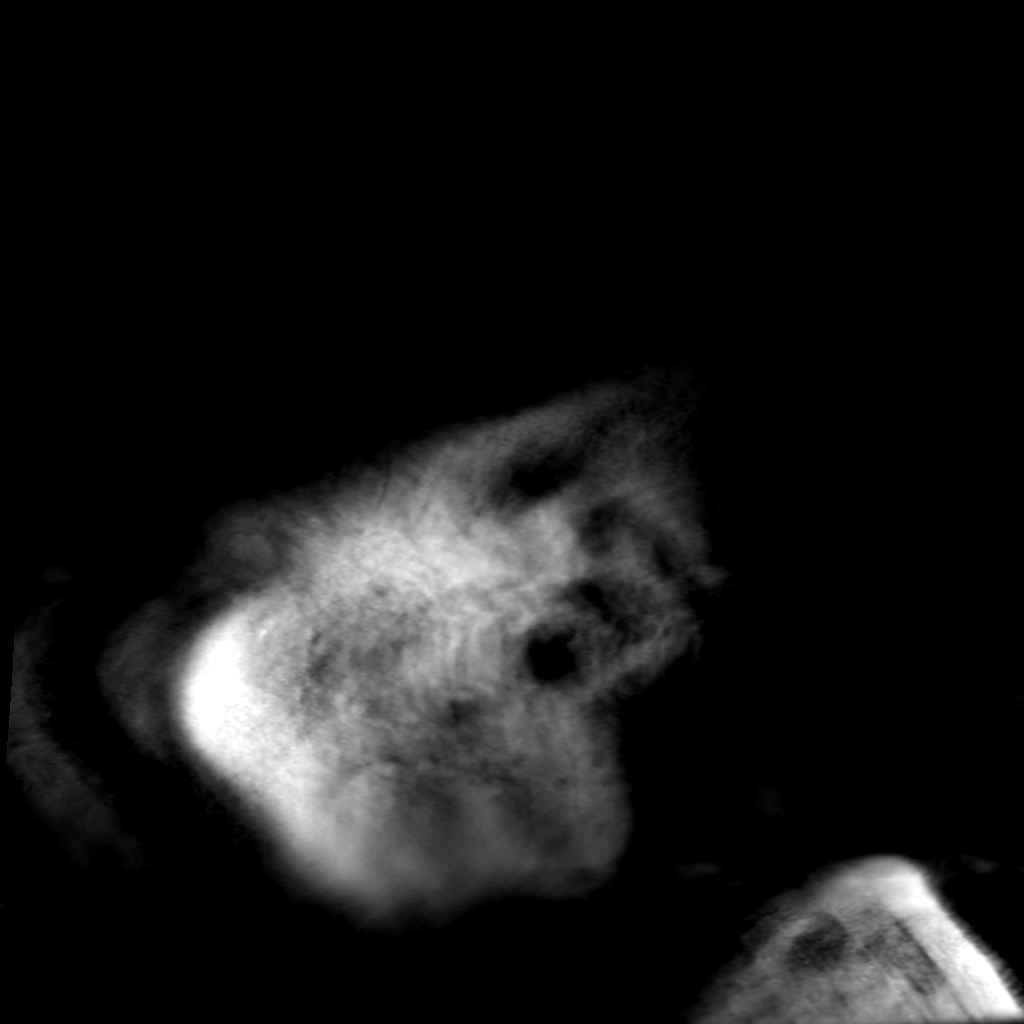
[im 8/24]
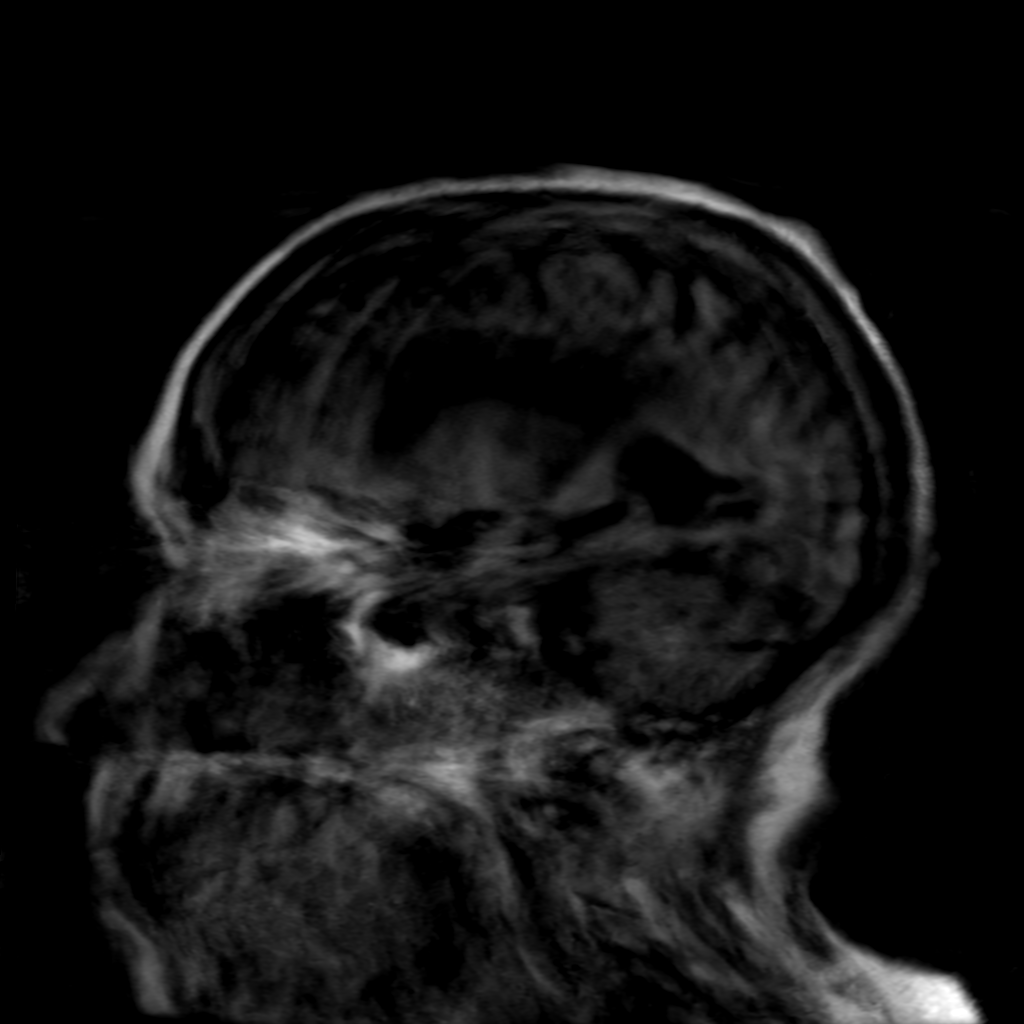
[im 16/24]
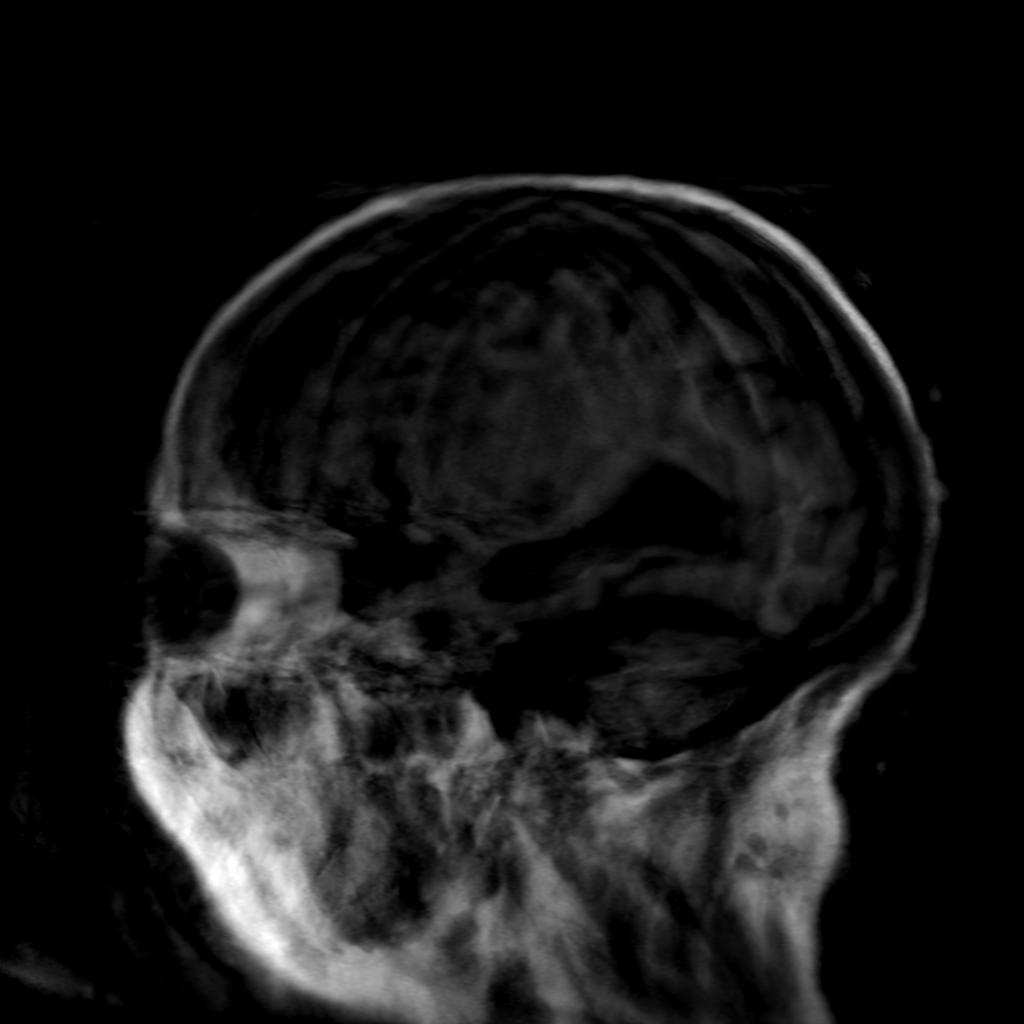
[im 24/24]
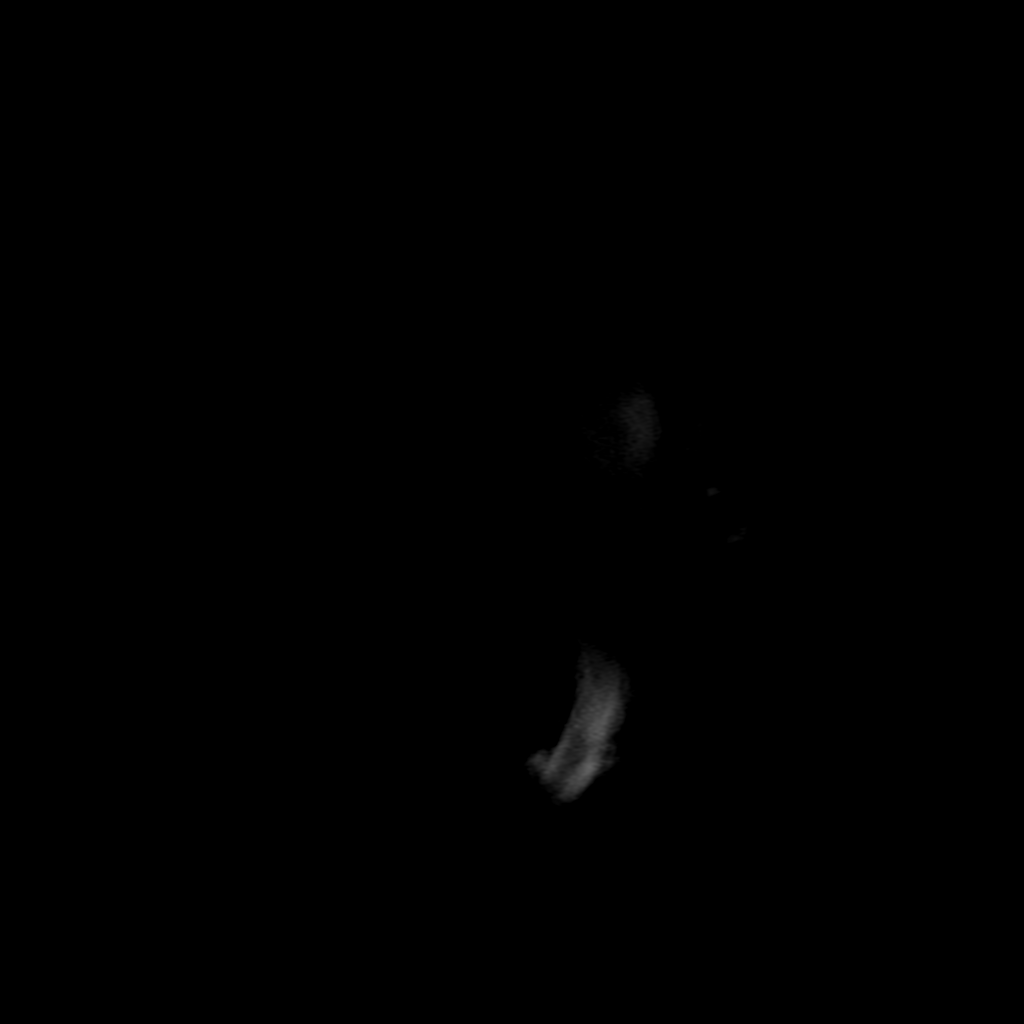

[Series 250: ADC · axial · 3.0mm · 0.94mm/px · z∈[-71,+21]mm · 3 of 50 slices shown]
[im 9/50]
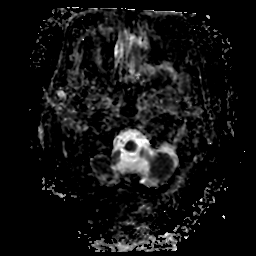
[im 25/50]
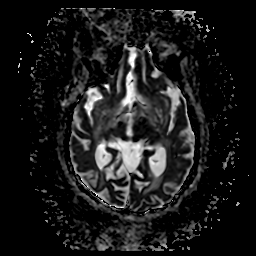
[im 41/50]
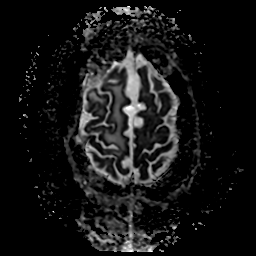

[20 of 48 positions shown; findings below may reference images not displayed]

FINDINGS: DWI, sagittal T1, and axial T2 sequences were attempted. There is
significant motion artifact.

Brain: Punctate foci of reduced diffusion are present in the right
temporal lobe, right thalamus, left thalamus, and left parietal
lobe. Large right MCA territory infarction. Wallerian degeneration
along the right cerebral peduncle. Additional scattered smaller
chronic infarcts. No significant mass effect. No hydrocephalus.

Vascular: Major vessel flow voids at the skull base grossly
preserved.

Skull and upper cervical spine: Normal marrow signal is grossly
preserved.

Sinuses/Orbits: Paranasal sinuses are aerated. Orbits are grossly
unremarkable.

Other: None.
IMPRESSION: Significantly motion degraded study. Patient could not tolerate all
sequences.

Few punctate acute infarcts of right temporal lobe, bilateral
thalamus, and left parietal lobe.

## 2021-05-12 NOTE — Progress Notes (Signed)
Performed positioning assessment per [facility] request.   Patient in bed with heavy right lean- attempted to reposition for more accurate assessment of positioning needs, however very physically resistive to any attempts at movement/repositioning, and in the moments I was able to reposition her she returned to baseline position as soon as external support was removed.   Ideally she would receive a custom contoured supportive backrest also with contoured head/neck rest to support optimal alignment of trunk, as well as custom tilt in space wheelchair with left WC tray. Would certainly recommend follow-up with wheelchair vendor to address these needs.   Currently and to facilitate DC, for temporary positioning needs (assessment limited in acute care hospital setting), would recommend trial of medium triangle wedge to be placed on her right side in order to correct posterior and right lean. Would also fold and tuck hospital size towels (number will likely vary based on her day/fatigue) next to her right side/slightly behind in order to facilitate upright, midline posture. Finally, might benefit from neck pillow or folded towel placed around cervical region to encouraged midline neck positioning.   Would also certainly recommend use of Rojo cushion especially for HD sessions or any situation in which she needs to be upright/OOB for extended periods of time.   Please direct message in Epic if any questions arise.     05/12/21 1328  PT Time Calculation  PT Start Time (ACUTE ONLY) 1258  PT Stop Time (ACUTE ONLY) 1307  PT Time Calculation (min) (ACUTE ONLY) 9 min  PT General Charges  $$ ACUTE PT VISIT 1 Visit  PT Treatments  $Self Care/Home Management 8-22    Windell Norfolk, DPT, PN1   Supplemental Physical Therapist [place]    Pager [phone] Acute Rehab Office [phone]

## 2021-05-12 NOTE — Evaluation (Signed)
Occupational Therapy Evaluation Patient Details Name: Kirsten Mcmahon MRN: [id number] DOB: 10-27-1946 Today's Date: 05/12/2021    History of Present Illness 75yo female admitted 04/28/21 from an HD center after refusing dialysis. Also thought to have acute metabolic encepthalopathy. PMH breast CA, CVA with L residual hemiplegia, seizures, HTN, R fronto-temporal craniotomy   Clinical Impression   Pt admitted with the above diagnoses and presents with below problem list. Pt will benefit from continued acute OT to address the below listed deficits and maximize independence with basic ADLs prior to d/c to venue below. PTA pt was living with her son who works full-time, receives [employer] per chart review. At baseline, pt needs assistance with ADLs and functional transfers at w/c level. Pt with L thumb opposition/flexion contracture placing her at risk for skin breakdown in that area. Plan to trial a palm guard for L hand for potential use at night time at home. She will need caregiver assistance to manage splint so will need to include son in splint discussions/education if trial is successful in acute care setting.     Follow Up Recommendations  Supervision/Assistance - 24 hour;SNF    Equipment Recommendations  3 in 1 bedside commode;Hospital bed    Recommendations for Other Services       Precautions / Restrictions Precautions Precautions: Fall;Other (comment) Precaution Comments: R perm-cath; history of R fronto-temporal craniectomy with musculocutaneous flap over site Restrictions Weight Bearing Restrictions: No      Mobility Bed Mobility               General bed mobility comments: unable to assess with only +1 assist available this session. +2 for bed mobility.    Transfers                 General transfer comment: unable    Balance               Standing balance comment: unable to assess                           ADL either performed or  assessed with clinical judgement   ADL Overall ADL's : Needs assistance/impaired                                       General ADL Comments: Unable to fully assess as pt with R lateral trunk flexed position and impaired cognition. NT providing full assistance with feeding earlier. Appears to need up to total assist but may fluctuate a bit depending on bed vs supported seating position?     Vision   Vision Assessment?: Vision impaired- to be further tested in functional context Additional Comments: R gaze preference. Difficulty tracking post midline into L visual field     Perception     Praxis      Pertinent Vitals/Pain Pain Assessment: Faces Faces Pain Scale: Hurts a little bit Pain Location: with L shoulder PROM at about 40* flexion in supine/sidelying position Pain Descriptors / Indicators: Grimacing Pain Intervention(s): Limited activity within patient's tolerance;Monitored during session;Repositioned;Other (comment) (decreased degree of PROM shoulder flexion to about 30*)     Hand Dominance Right   Extremity/Trunk Assessment Upper Extremity Assessment Upper Extremity Assessment: Generalized weakness;RUE deficits/detail;LUE deficits/detail;Difficult to assess due to impaired cognition RUE Deficits / Details: difficult to assess 2/2 cognition and R lateral trunk flexion preference in supine. NT  feeding pt breakfast earlier (cognition vs ROM/strength deficits?) LUE Deficits / Details: L side hemiplegia at baseline, L side neglect? Thumb flexion opposition contracture; able to achieve full PROM extension of thumb joints. Mild edema, boosted on pillows at end of session. Pt reports gross sensation intact at shoulder level but no sensation in hand. Possible subluxation at shoulder level. Able to move through elbow/wrist/hand PROM with exception of thumb.   Lower Extremity Assessment Lower Extremity Assessment: Defer to PT evaluation   Cervical / Trunk  Assessment Cervical / Trunk Assessment: Other exceptions (In right lateral trunk flexion in bed throughout session with difficulty coming out of that position. R gaze preference vs L side inattention?)   Communication Communication Communication: Other (comment) (minimal verbalizations. able to answer yes/no questions.)   Cognition Arousal/Alertness: Awake/alert Behavior During Therapy: Flat affect Overall Cognitive Status: No family/caregiver present to determine baseline cognitive functioning                                 General Comments: pleasant and cooperative. Intellectual awareness. slow processing. decreased problem solving. minimal verbalizations.   General Comments       Exercises Other Exercises Other Exercises: PROM to toleration L shoulder and distal.   Shoulder Instructions      Home Living Family/patient expects to be discharged to:: Private residence Living Arrangements: Children Available Help at Discharge: Available PRN/intermittently;Family;Other (Comment) (support services from [facility]) Type of Home: House Home Access: Ramped entrance     Home Layout: One level     Bathroom Shower/Tub: Tub/shower unit         Home Equipment: Shower seat;Wheelchair - manual   Additional Comments: Patient limited historian; information obtained from the chart      Prior Functioning/Environment Level of Independence: Needs assistance  Gait / Transfers Assistance Needed: primary means of mobility in wheelchair ADL's / Homemaking Assistance Needed: required assistance for ADLs   Comments: Patient limited historian; information obtained from chart.  WC level as primary mobility, assist for ADLs and transfers between seating surfaces.  Lives with son and receives support services from [facility].        OT Problem List: Decreased strength;Decreased range of motion;Impaired balance (sitting and/or standing);Impaired vision/perception;Decreased  coordination;Decreased cognition;Decreased safety awareness;Decreased knowledge of use of DME or AE;Impaired UE functional use;Increased edema      OT Treatment/Interventions: Self-care/ADL training;Therapeutic exercise;Neuromuscular education;DME and/or AE instruction;Therapeutic activities;Cognitive remediation/compensation;Visual/perceptual remediation/compensation;Patient/family education;Balance training    OT Goals(Current goals can be found in the care plan section) Acute Rehab OT Goals Patient Stated Goal: none stated OT Goal Formulation: Patient unable to participate in goal setting Time For Goal Achievement: 05/26/21 Potential to Achieve Goals: Fair ADL Goals Pt Will Perform Eating: with max assist;sitting;bed level Pt Will Perform Grooming: with max assist;sitting;bed level Additional ADL Goal #1: Caregiver will be independent with L palm guard splint management and wearing schedule.  OT Frequency: Min 2X/week   Barriers to D/C:            Co-evaluation              AM-PAC OT "6 Clicks" Daily Activity     Outcome Measure Help from another person eating meals?: Total Help from another person taking care of personal grooming?: Total Help from another person toileting, which includes using toliet, bedpan, or urinal?: Total Help from another person bathing (including washing, rinsing, drying)?: Total Help from another person to put on and  taking off regular upper body clothing?: Total Help from another person to put on and taking off regular lower body clothing?: Total 6 Click Score: 6   End of Session Equipment Utilized During Treatment: Other (comment) (rolled washcloth in L hand to protect palm 2/2 L thumb opposition/flexion cntracture)  Activity Tolerance:   Patient left: in bed;with call bell/phone within reach;with bed alarm set  OT Visit Diagnosis: Other abnormalities of gait and mobility (R26.89);Other symptoms and signs involving cognitive function                 Time: 5461-2432 OT Time Calculation (min): 15 min Charges:  OT General Charges $OT Visit: 1 Visit OT Evaluation $OT Eval Moderate Complexity: [place], OT Acute Rehabilitation Services Pager: [phone] Office: [phone]   Hortencia Pilar 05/12/2021, 12:48 PM

## 2021-05-12 NOTE — Progress Notes (Signed)
PROGRESS NOTE    Kirsten Mcmahon  UXN:[id number] DOB: 01-20-1946 DOA: 04/28/2021 PCP: [place]   Brief Narrative:  75 year old F with history of Streptococcal bacteremia on IV vanc, ESRD on HD MWF, NIDDM-2, CVA with left hemiparesis, HTN, HLD and cognitive deficit presenting with confusion and admitted for encephalopathy felt to be due to uremia in the setting of missed HD.  His BUN was 66 on admission.  However, encephalopathy did not improve after dialysis.  Family reports progressive confusion since her fall 2 months prior.  She was also on clonidine patch which has been discontinued since admission.  CT head without acute finding but old right MCA infarct.  She is on Keppra 500 mg daily with additional 250 mg after HD.  Basic encephalopathy work-up including VBG, ammonia, TSH, B12 and RPR unrevealing.  Palliative medicine consulted.  Still full code with full scope of care and HD.  Renal navigator checking for HD unit in the area that will allow a chairside sitter, so family can sit with patient during dialysis session.  Assessment & Plan:   Principal Problem:   ESRD (end stage renal disease) ([place]) Active Problems:   Hypertension   Hypothyroidism   Type II diabetes mellitus with renal manifestations ([place])   Streptococcal bacteremia   Pressure injury of skin   Physical deconditioning   Generalized muscle weakness   Major cognitive impairment/possible acute metabolic encephalopathy-initially felt to be due to uremia from missed HD but no improvement with dialysis.  She could have vascular dementia. CTH without acute finding but chronic right MCA CVA.  Other encephalopathy work-up including VBG, TSH, ammonia, B12 and RPR unrevealing.  Patient was also on clonidine patch that was discontinued since admission.  She is also on Keppra which is renally dosed but could still cause some degree of encephalopathy but I highly doubt it since she has been on it for very long time.   She is only oriented to self.  -Reorientation and delirium precautions -Avoid sedating medications.  Yesterday she was too lethargic to open her eyes or have any sort of conversation so MRI was ordered but has not been done yet.  She is alert like she was day before yesterday and she is oriented to self and place as well.  She has no complaints.  She is having hard time lifting her left upper extremity and left lower extremity.  I have reached out to the nurse to reach out to MRI department and get MRI done as soon as possible.  ESRD on HD MWF-Seems to be tolerating HD in a chair but requires sitter.  Bone mineral disorder -Renal navigator checking for HD unit in the area that will allow a chair side sitter. -Palliative medicine following as well.  Dialysis per nephrology.  Streptococcus group G bacteremia-diagnosed prior hospitalization.  Family declined TTE at that time.  Plan was IV ampicillin until 06/01/2021 that seems to have changed to IV vancomycin with HD. -Continue IV vancomycin with HD  Anemia of chronic kidney disease: H&H stable.  -ESA and IV iron per nephrology  Essential hypertension: On metoprolol and clonidine patch at home.  Clonidine d/cd earlier.  Blood pressure now better with resumption of Toprol-XL.  Hypothyroidism: TSH within normal -Continue Synthroid 112 mcg daily  Controlled NIDDM-2 with ESRD: A1c 5.7%.  CBG within appropriate range.  CBG monitoring discontinued.  History of seizures: Seems a stable. -Continue Keppra 500 mg daily and Keppra 250 mg three times weekly  History of  right MCA CVA with residual left hemiparesis-CT head without acute finding. Continue home meds  Pressure injury Mid buttocks, present on admission.  Goal of care counseling:   Poor long-term prognosis.  Seen by palliative care.  Still full code which I believe would pose more harm than benefit.  Patient is followed by [facility].  Previous hospitalist had extensive  discussion with patient's son, [name] over the phone.  They discussed about the pros and cons of CPR and intubation.  He asked physicians opinion and he recommended DNR and DNI but he struggled to make a decision, and wanted to continue full code.  Nutrition Body mass index is 22.34 kg/m. Nutrition Problem: Increased nutrient needs Etiology: chronic illness (ESRD on HD) Signs/Symptoms: estimated needs Interventions: Nepro shake,MVI,Prostat Pressure Injury 04/29/21 Buttocks Mid Stage 2 -  Partial thickness loss of dermis presenting as a shallow open injury with a red, pink wound bed without slough. (Active) 04/29/21 0045 Location: Buttocks Location Orientation: Mid Staging: Stage 2 -  Partial thickness loss of dermis presenting as a shallow open injury with a red, pink wound bed without slough. Wound Description (Comments):  Present on Admission: Yes  DVT prophylaxis: heparin injection 5,000 Units Start: 04/30/21 2200   Code Status: Full Code  Family Communication:  None present at bedside.   Status is: Inpatient  Remains inpatient appropriate because:Unsafe d/c plan   Dispo: The patient is from: Home              Anticipated d/c is to: Home              Patient currently is not medically stable to d/c.   Difficult to place patient No        Estimated body mass index is 22.34 kg/m as calculated from the following:   Height as of this encounter: 5\' 2"  (1.575 m).   Weight as of this encounter: 55.4 kg.  Pressure Injury 04/29/21 Buttocks Mid Stage 2 -  Partial thickness loss of dermis presenting as a shallow open injury with a red, pink wound bed without slough. (Active)  04/29/21 0045  Location: Buttocks  Location Orientation: Mid  Staging: Stage 2 -  Partial thickness loss of dermis presenting as a shallow open injury with a red, pink wound bed without slough.  Wound Description (Comments):   Present on Admission: Yes     Nutritional status:  Nutrition Problem:  Increased nutrient needs Etiology: chronic illness (ESRD on HD)   Signs/Symptoms: estimated needs   Interventions: Nepro shake,MVI,Prostat    Consultants:   Nephrology  Palliative care  Procedures:   None  Antimicrobials:  Anti-infectives (From admission, onward)   Start     Dose/Rate Route Frequency Ordered Stop   05/07/21 1200  vancomycin (VANCOREADY) IVPB 500 mg/100 mL        500 mg 100 mL/hr over 60 Minutes Intravenous  Once 05/07/21 0803 05/07/21 1229   05/06/21 1200  vancomycin (VANCOCIN) IVPB 500 mg/100 ml premix        500 mg 100 mL/hr over 60 Minutes Intravenous Every M-W-F (Hemodialysis) 04/30/21 0727 06/01/21 2359   05/04/21 1800  vancomycin (VANCOREADY) IVPB 500 mg/100 mL  Status:  Discontinued        500 mg 100 mL/hr over 60 Minutes Intravenous  Once 05/04/21 1524 05/07/21 0803   05/04/21 1609  vancomycin (VANCOCIN) 500-5 MG/100ML-% IVPB       Note to Pharmacy: [name]  : cabinet override  05/04/21 1609 05/04/21 1613   05/01/21 1200  vancomycin (VANCOCIN) IVPB 500 mg/100 ml premix  Status:  Discontinued       Note to Pharmacy: For this week only she is going to receive hd on 5/26 and 5/28 so please give the Vancomycin. FOr next week back to mon wed fri   500 mg 100 mL/hr over 60 Minutes Intravenous Every M-W-F (Hemodialysis) 04/30/21 0723 04/30/21 0726   04/30/21 1800  vancomycin (VANCOCIN) IVPB 500 mg/100 ml premix       Note to Pharmacy: For this week only she is going to receive hd on 5/26 and 5/28 so please give the Vancomycin. FOr next week back to mon wed fri   500 mg 100 mL/hr over 60 Minutes Intravenous Every T-Th-Sa (1800) 04/30/21 0727 05/02/21 1839   04/29/21 1539  vancomycin (VANCOCIN) 500-5 MG/100ML-% IVPB       Note to Pharmacy: [name]  : cabinet override      04/29/21 1539 04/29/21 1727   04/29/21 1200  vancomycin (VANCOREADY) IVPB 500 mg/100 mL  Status:  Discontinued        500 mg 100 mL/hr over 60 Minutes  Intravenous Every M-W-F (Hemodialysis) 04/28/21 1812 04/30/21 8638         Subjective: Seen and examined.  She is much more alert and oriented x2 to self and place today.  Has no complaint.  Objective: Vitals:   05/11/21 1714 05/11/21 2018 05/12/21 0401 05/12/21 0745  BP: 123/87 100/69 113/69 112/73  Pulse: (!) 106 (!) 118 (!) 110 95  Resp:  20 18 16   Temp: 98.3 F (36.8 C) 99.1 F (37.3 C) 98.2 F (36.8 C) 98.9 F (37.2 C)  TempSrc: Axillary Axillary Oral Axillary  SpO2: 100% 99% 99% 94%  Height:        Intake/Output Summary (Last 24 hours) at 05/12/2021 0935 Last data filed at 05/12/2021 0919 Gross per 24 hour  Intake 220 ml  Output 1500 ml  Net -1280 ml   Filed Weights    Examination:  General exam: Appears calm and comfortable  Respiratory system: Clear to auscultation. Respiratory effort normal. Cardiovascular system: S1 & S2 heard, RRR. No JVD, murmurs, rubs, gallops or clicks. No pedal edema. Gastrointestinal system: Abdomen is nondistended, soft and nontender. No organomegaly or masses felt. Normal bowel sounds heard. Central nervous system: Alert and oriented x2.  Flaccid weakness on the left upper and lower extremity. Extremities: Symmetric 5 x 5 power. Skin: No rashes, lesions or ulcers.  Psychiatry: Judgement and insight appear poor  Data Reviewed: I have personally reviewed following labs and imaging studies  CBC: Recent Labs  Lab 05/06/21 0117 05/07/21 0101 05/08/21 0445  WBC 10.2 10.1 6.8  NEUTROABS  --  7.0 4.7  HGB 9.1* 7.9* 8.6*  HCT 28.4* 25.2* 27.3*  MCV 89.6 91.3 91.6  PLT 198 197 177*   Basic Metabolic Panel: Recent Labs  Lab 05/06/21 0117 05/07/21 0101 05/08/21 0445  NA 131* 130* 136  K 4.3 4.8 3.7  CL 94* 92* 99  CO2 24 26 25   GLUCOSE 137* 121* 88  BUN 28* 42* 19  CREATININE 4.76* 6.30* 4.31*  CALCIUM 9.1 8.6* 8.8*  MG  --  2.1 2.1  PHOS  --  2.7 3.2   GFR: Estimated Creatinine Clearance: 8.8 mL/min (A) (by C-G formula  based on SCr of 4.31 mg/dL (H)). Liver Function Tests: Recent Labs  Lab 05/07/21 0101 05/08/21 0445  AST 26 22  ALT 14  16  ALKPHOS 60 74  BILITOT 1.1 0.7  PROT 6.4* 7.1  ALBUMIN 2.4* 2.7*   No results for input(s): LIPASE, AMYLASE in the last 168 hours. Recent Labs  Lab 05/07/21 1859  AMMONIA 26   Coagulation Profile: No results for input(s): INR, PROTIME in the last 168 hours. Cardiac Enzymes: No results for input(s): CKTOTAL, CKMB, CKMBINDEX, TROPONINI in the last 168 hours. BNP (last 3 results) No results for input(s): PROBNP in the last 8760 hours. HbA1C: No results for input(s): HGBA1C in the last 72 hours. CBG: Recent Labs  Lab 05/10/21 0812 05/10/21 1201 05/10/21 1718 05/11/21 1955 05/12/21 0742  GLUCAP 95 107* 104* 144* 90   Lipid Profile: No results for input(s): CHOL, HDL, LDLCALC, TRIG, CHOLHDL, LDLDIRECT in the last 72 hours. Thyroid Function Tests: No results for input(s): TSH, T4TOTAL, FREET4, T3FREE, THYROIDAB in the last 72 hours. Anemia Panel: No results for input(s): VITAMINB12, FOLATE, FERRITIN, TIBC, IRON, RETICCTPCT in the last 72 hours. Sepsis Labs: No results for input(s): PROCALCITON, LATICACIDVEN in the last 168 hours.  No results found for this or any previous visit (from the past 240 hour(s)).    Radiology Studies: No results found.  Scheduled Meds: . (feeding supplement) PROSource Plus  30 mL Oral TID BM  . Chlorhexidine Gluconate Cloth  6 each Topical Q0600  . Chlorhexidine Gluconate Cloth  6 each Topical Q0600  . darbepoetin (ARANESP) injection - DIALYSIS  25 mcg Intravenous Q Mon-HD  . feeding supplement (NEPRO CARB STEADY)  237 mL Oral BID BM  . heparin  5,000 Units Subcutaneous Q8H  . latanoprost  1 drop Both Eyes QHS  . letrozole  2.5 mg Oral Daily  . levETIRAcetam  500 mg Oral Q1500   And  . levETIRAcetam  250 mg Oral Once per day on Mon Wed Fri  . levothyroxine  112 mcg Oral QAC breakfast  . metoprolol succinate  50  mg Oral Daily  . multivitamin  1 tablet Oral QHS  . vancomycin  500 mg Intravenous Q M,W,F-HD   Continuous Infusions:   LOS: 14 days   Time spent: 29-minute   Darliss Cheney, MD [facility]  05/12/2021, 9:35 AM   How to contact the [facility] Attending or Consulting provider [place] or covering provider during after hours [place], for this patient?  1. Check the care team in [facility] and look for a) attending/consulting [facility] provider listed and b) the [facility] team listed. Page or secure chat [phone]. 2. Log into [email or web address] and use [place]'s universal password to access. If you do not have the password, please contact the hospital operator. 3. Locate the [facility] provider you are looking for under [facility] and page to a number that you can be directly reached. 4. If you still have difficulty reaching the provider, please page the [facility] (Director on Call) for the Hospitalists listed on amion for assistance.
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
## 2021-05-12 NOTE — Progress Notes (Signed)
CSW arranged care meeting over [place] with [facility] MD, Dr. Twanna Hy, Marya Amsler Renal Navigator, Renee from [facility], and hospital MDs and physical therapist at 12pm.   Gilmore Laroche, MSW, [facility]

## 2021-05-13 ENCOUNTER — Inpatient Hospital Stay (HOSPITAL_COMMUNITY): Payer: Medicare (Managed Care)

## 2021-05-13 ENCOUNTER — Other Ambulatory Visit (HOSPITAL_COMMUNITY): Payer: Medicare (Managed Care)

## 2021-05-13 DIAGNOSIS — R4182 Altered mental status, unspecified: Secondary | ICD-10-CM

## 2021-05-13 DIAGNOSIS — I6340 Cerebral infarction due to embolism of unspecified cerebral artery: Secondary | ICD-10-CM | POA: Insufficient documentation

## 2021-05-13 LAB — HEPATITIS B SURFACE ANTIGEN: Hepatitis B Surface Ag: NONREACTIVE

## 2021-05-13 LAB — CBC
HCT: 26.5 % — ABNORMAL LOW (ref 36.0–46.0)
Hemoglobin: 8.2 g/dL — ABNORMAL LOW (ref 12.0–15.0)
MCH: 28.6 pg (ref 26.0–34.0)
MCHC: 30.9 g/dL (ref 30.0–36.0)
MCV: 92.3 fL (ref 80.0–100.0)
Platelets: 251 10*3/uL (ref 150–400)
RBC: 2.87 MIL/uL — ABNORMAL LOW (ref 3.87–5.11)
RDW: 14.5 % (ref 11.5–15.5)
WBC: 7.3 10*3/uL (ref 4.0–10.5)
nRBC: 0 % (ref 0.0–0.2)

## 2021-05-13 LAB — RENAL FUNCTION PANEL
Albumin: 2.6 g/dL — ABNORMAL LOW (ref 3.5–5.0)
Anion gap: 11 (ref 5–15)
BUN: 30 mg/dL — ABNORMAL HIGH (ref 8–23)
CO2: 28 mmol/L (ref 22–32)
Calcium: 8.6 mg/dL — ABNORMAL LOW (ref 8.9–10.3)
Chloride: 97 mmol/L — ABNORMAL LOW (ref 98–111)
Creatinine, Ser: 5.59 mg/dL — ABNORMAL HIGH (ref 0.44–1.00)
GFR, Estimated: 7 mL/min — ABNORMAL LOW (ref >60)
Glucose, Bld: 197 mg/dL — ABNORMAL HIGH (ref 70–99)
Phosphorus: 3.6 mg/dL (ref 2.5–4.6)
Potassium: 3.4 mmol/L — ABNORMAL LOW (ref 3.5–5.1)
Sodium: 136 mmol/L (ref 135–145)

## 2021-05-13 LAB — LDL CHOLESTEROL, DIRECT: Direct LDL: 88.5 mg/dL (ref 0–99)

## 2021-05-13 IMAGING — MR MR MRA HEAD W/O CM
3 series · 19 of 48 positions shown · non-contrast
Comparison: MRI of the brain [DATE].

CLINICAL DATA: Neuro deficit, acute, stroke suspected.

EXAM:
MRA HEAD WITHOUT CONTRAST
TECHNIQUE: Angiographic images of the Circle of Willis were acquired using MRA
technique without intravenous contrast.

[Series 2: ax (id) · axial · 1.0mm · 0.43mm/px · z∈[-86,-6]mm · 17 of 176 slices shown]
[im 1/176]
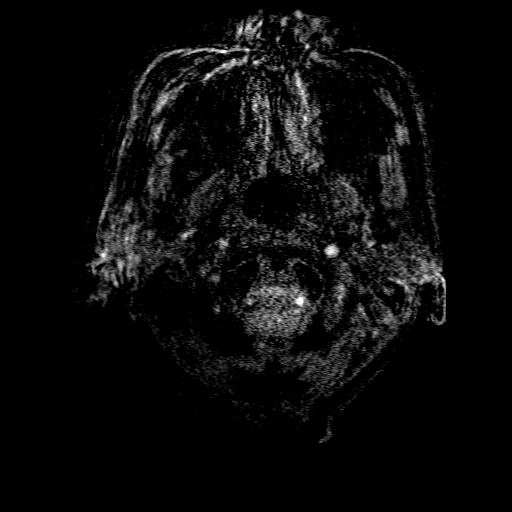
[im 4/176]
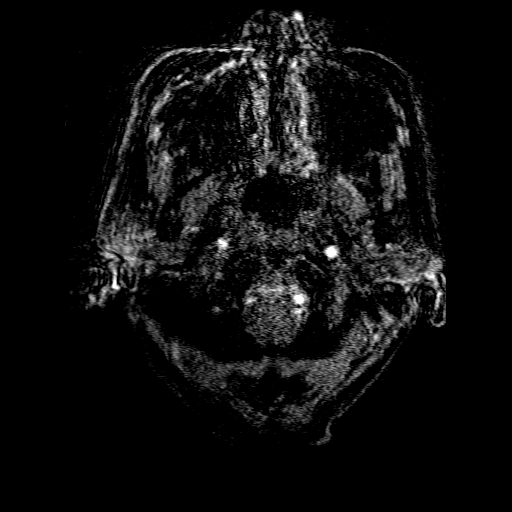
[im 8/176]
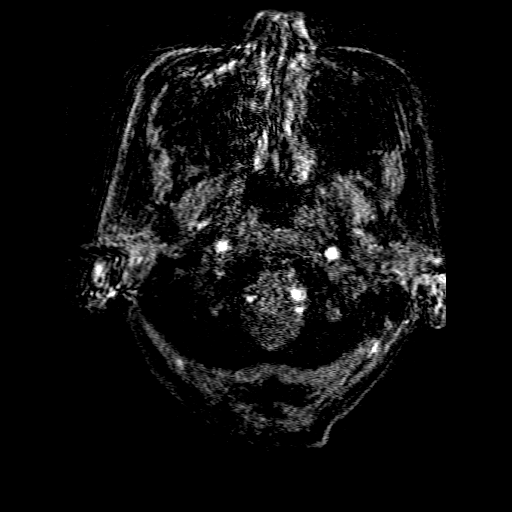
[im 12/176]
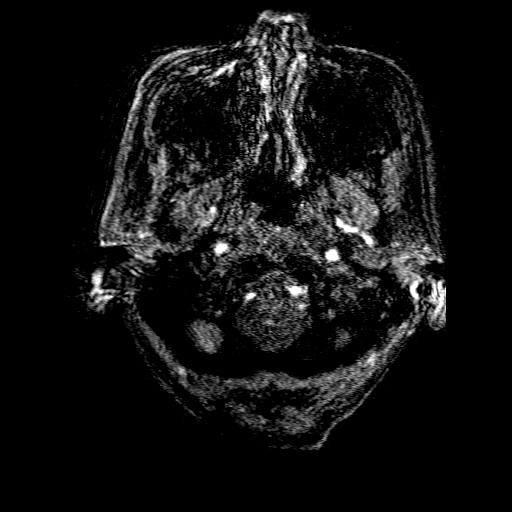
[im 16/176]
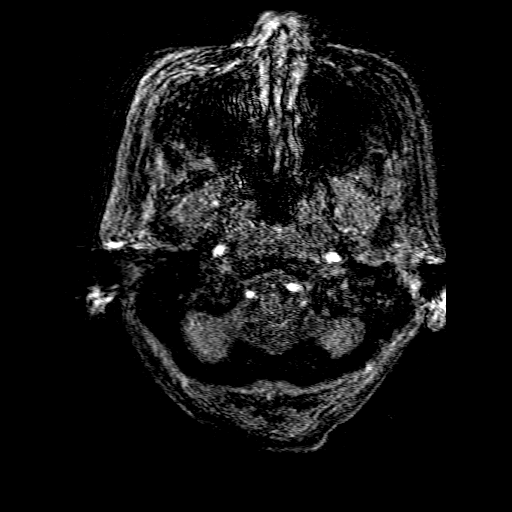
[im 20/176]
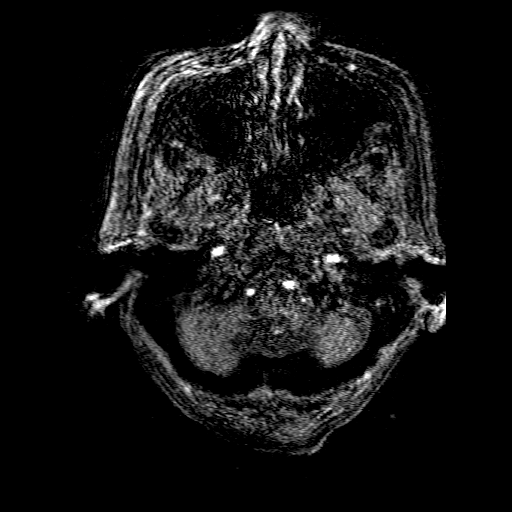
[im 24/176]
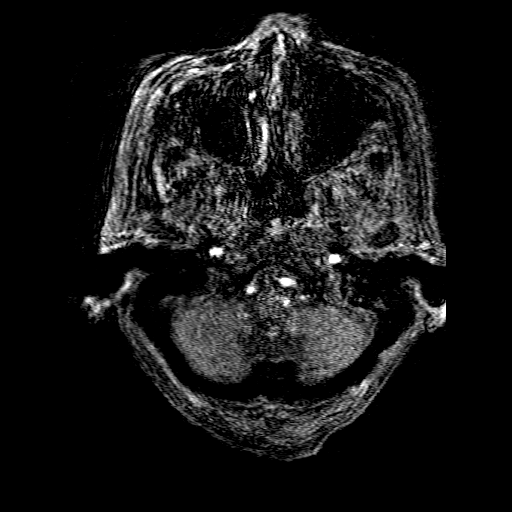
[im 28/176]
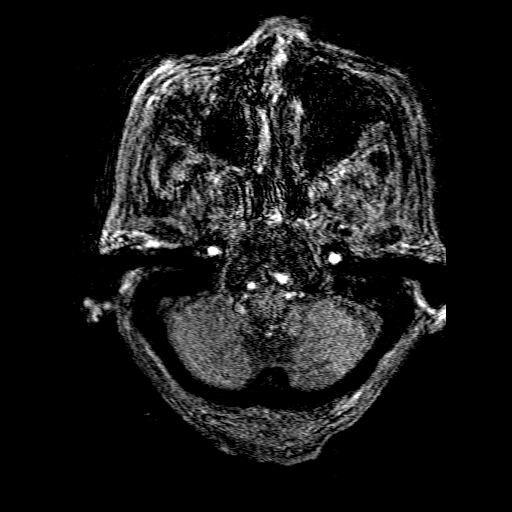
[im 32/176]
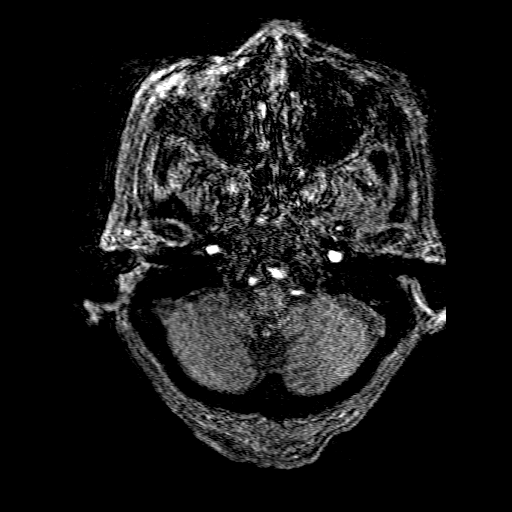
[im 55/176]
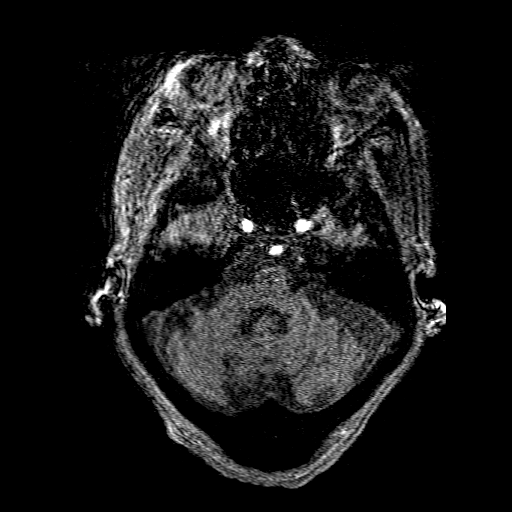
[im 78/176]
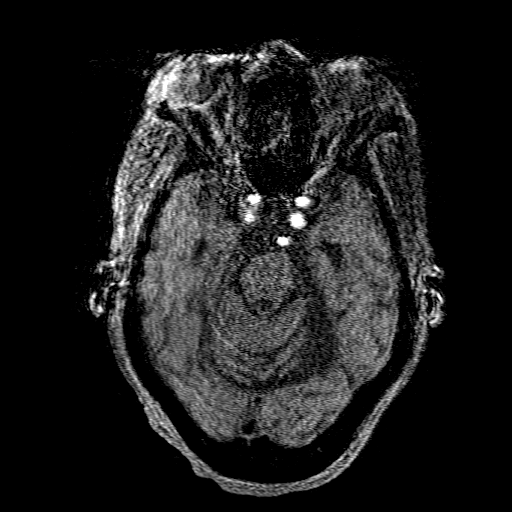
[im 90/176]
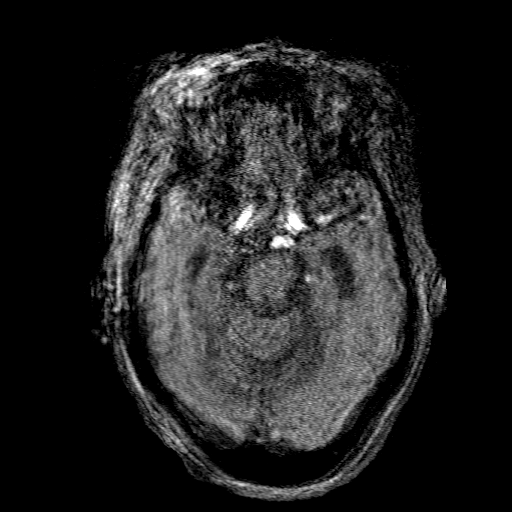
[im 98/176]
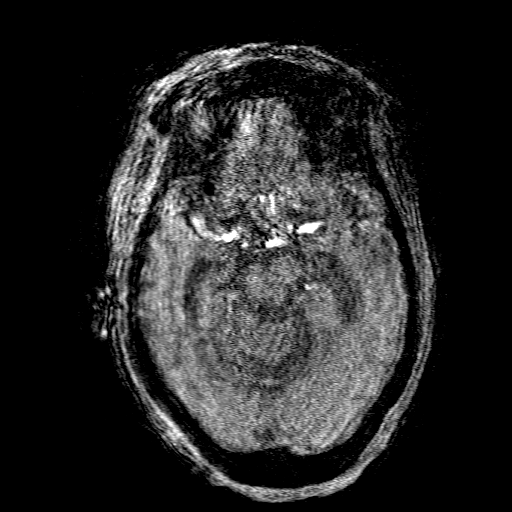
[im 121/176]
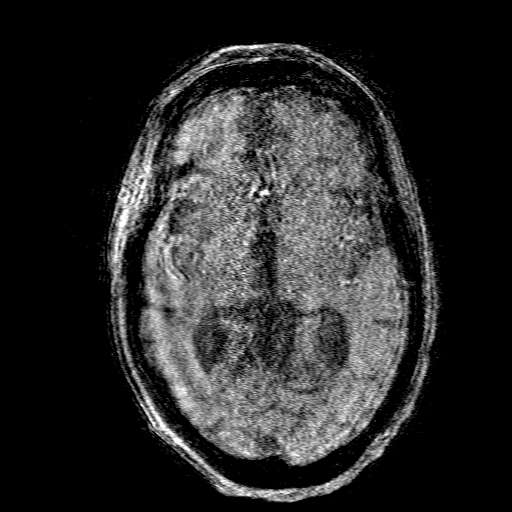
[im 144/176]
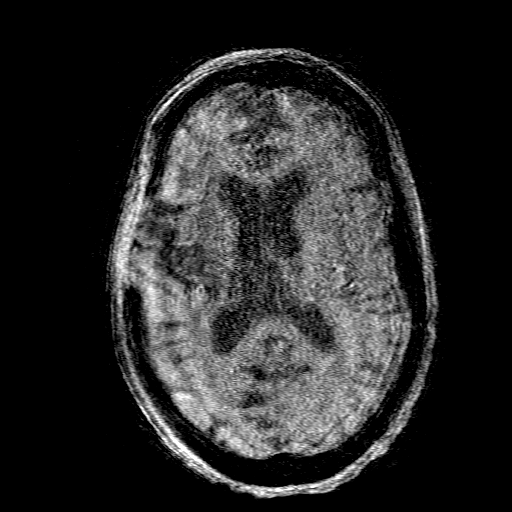
[im 148/176]
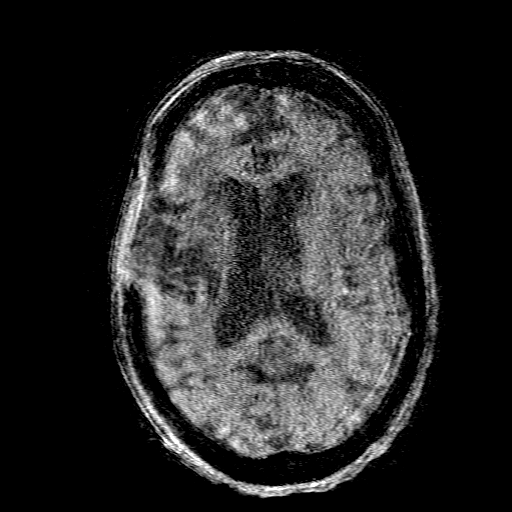
[im 168/176]
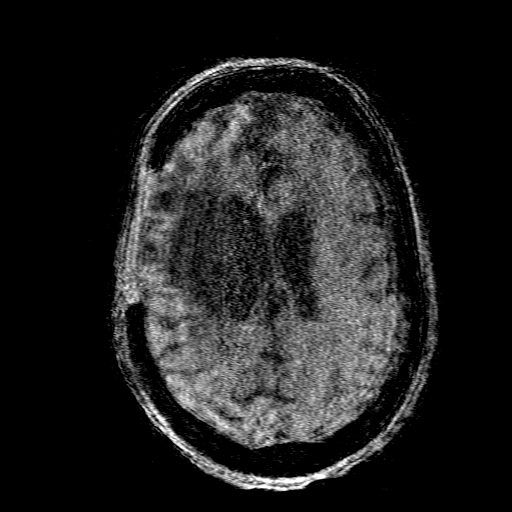

[Series 200: col:ax (id) · axial · 1.0mm · 0.43mm/px · 1 of 1 slices shown]
[im 1/1]
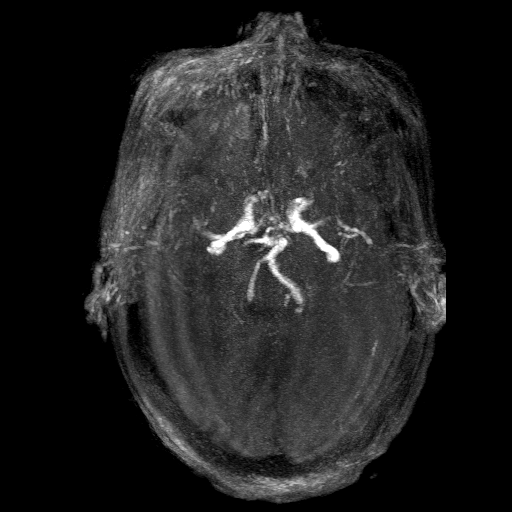

[Series 201: pjn:ax (id) · sagittal · 1.0mm · 0.43mm/px · 1 of 5 slices shown]
[im 1/5]
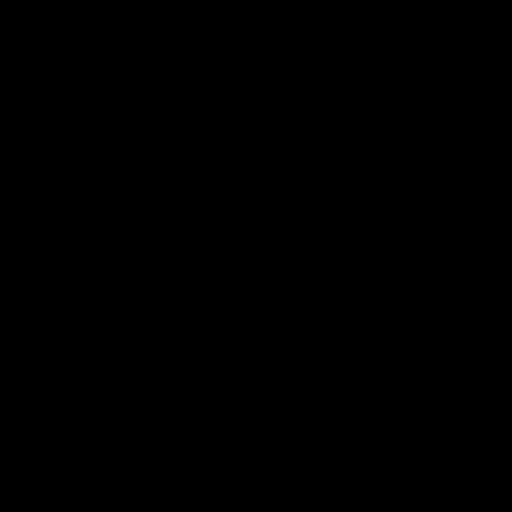

[19 of 48 positions shown; findings below may reference images not displayed]

FINDINGS: The study is severely degraded by motion. Most images are
essentially nondiagnostic.

Anterior circulation: Correlated identified in the intracranial
internal carotid arteries and proximal M1 and A1 segments.

Posterior circulation: Flow related enhancement noted in the basilar
artery and bilateral vertebral arteries

Other: None.
IMPRESSION: 1. Severely degraded study due to motion artifact. Most images are
essentially nondiagnostic.
2. Flow related enhancement seen in the intracranial internal
carotid arteries, proximal bilateral M1/MCA segments and A1/ACA
segments.
3. Flow related enhancement in the bilateral vertebral arteries and
basilar artery.
4. Remainder of the vessels cannot be evaluated.

## 2021-05-13 MED ORDER — SODIUM CHLORIDE 0.9 % IV SOLN: 100.0000 mL | INTRAVENOUS | Status: DC | PRN

## 2021-05-13 MED ORDER — PENTAFLUOROPROP-TETRAFLUOROETH EX AERO: 1.0000 "application " | INHALATION_SPRAY | CUTANEOUS | Status: DC | PRN

## 2021-05-13 MED ORDER — HEPARIN SODIUM (PORCINE) 1000 UNIT/ML DIALYSIS: 1000.0000 [IU] | INTRAMUSCULAR | Status: DC | PRN

## 2021-05-13 MED ORDER — LIDOCAINE-PRILOCAINE 2.5-2.5 % EX CREA: 1.0000 "application " | TOPICAL_CREAM | CUTANEOUS | Status: DC | PRN

## 2021-05-13 MED ORDER — HEPARIN SODIUM (PORCINE) 1000 UNIT/ML IJ SOLN
INTRAMUSCULAR | Status: AC
  Filled 2021-05-13: qty 4

## 2021-05-13 MED ORDER — VANCOMYCIN HCL IN DEXTROSE 500-5 MG/100ML-% IV SOLN
INTRAVENOUS | Status: AC
  Administered 2021-05-13: 500 mg via INTRAVENOUS
  Filled 2021-05-13: qty 100

## 2021-05-13 MED ORDER — ATORVASTATIN CALCIUM 40 MG PO TABS
40.0000 mg | ORAL_TABLET | Freq: Every day | ORAL | Status: DC
  Administered 2021-05-13 – 2021-05-14 (×2): 40 mg via ORAL
  Filled 2021-05-13 (×3): qty 1

## 2021-05-13 MED ORDER — LIDOCAINE HCL (PF) 1 % IJ SOLN: 5.0000 mL | INTRAMUSCULAR | Status: DC | PRN

## 2021-05-13 MED ORDER — ALTEPLASE 2 MG IJ SOLR: 2.0000 mg | Freq: Once | INTRAMUSCULAR | Status: DC | PRN

## 2021-05-13 MED ORDER — ASPIRIN 81 MG PO CHEW
81.0000 mg | CHEWABLE_TABLET | Freq: Every day | ORAL | Status: DC
  Administered 2021-05-13 – 2021-05-14 (×2): 81 mg via ORAL
  Filled 2021-05-13 (×3): qty 1

## 2021-05-13 NOTE — Progress Notes (Signed)
PROGRESS NOTE    Kirsten Mcmahon  WFU:[id number] DOB: 02-Nov-1946 DOA: 04/28/2021 PCP: [facility]   Brief Narrative:  75 year old F with history of Streptococcal bacteremia on IV vanc, ESRD on HD MWF, NIDDM-2, CVA with left hemiparesis, HTN, HLD and cognitive deficit presenting with confusion and admitted for encephalopathy felt to be due to uremia in the setting of missed HD.  His BUN was 66 on admission.  However, encephalopathy did not improve after dialysis.  Family reports progressive confusion since her fall 2 months prior.  She was also on clonidine patch which has been discontinued since admission.  CT head without acute finding but old right MCA infarct.  She is on Keppra 500 mg daily with additional 250 mg after HD.  Basic encephalopathy work-up including VBG, ammonia, TSH, B12 and RPR unrevealing.  Palliative medicine consulted.  Still full code with full scope of care and HD.  Renal navigator checking for HD unit in the area that will allow a chairside sitter, so family can sit with patient during dialysis session.  Assessment & Plan:   Principal Problem:   ESRD (end stage renal disease) ([place]) Active Problems:   Hypertension   Hypothyroidism   Type II diabetes mellitus with renal manifestations ([place])   Streptococcal bacteremia   Pressure injury of skin   Physical deconditioning   Generalized muscle weakness   Major cognitive impairment/possible acute metabolic encephalopathy-initially felt to be due to uremia from missed HD but no improvement with dialysis.  She could have vascular dementia. CTH without acute finding but chronic right MCA CVA.  Other encephalopathy work-up including VBG, TSH, ammonia, B12 and RPR unrevealing.  Patient was also on clonidine patch that was discontinued since admission.  She is also on Keppra which is renally dosed but could still cause some degree of encephalopathy but I highly doubt it since she has been on it for very long time.   She was more alert yesterday however slightly lethargic today.  Despite of being lethargic, she was able to tell me her name and that she is at [facility].  MRI was done late evening yesterday which shows bilateral multiple punctate infarcts. -Avoid sedating medications.   Acute ischemic stroke: MRI completed late evening of 05/12/2021 shows bilateral multiple punctate infarcts.  Neurology was consulted.  They are going to do further work-up with MR angiogram of head, Doppler carotid and possibly TEE to rule out endocarditis.  EEG is being done as well.  Neurology does not think that her encephalopathy secondary to new strokes.  Will defer to neurology for further work-up and management including antiplatelet agents.  I have updated patient's son.  ESRD on HD MWF-Seems to be tolerating HD in a chair but requires sitter.  Bone mineral disorder -Renal navigator checking for HD unit in the area that will allow a chair side sitter. -Palliative medicine following as well.  Dialysis per nephrology.  Streptococcus group G bacteremia-diagnosed prior hospitalization.  Family declined TTE at that time.  Plan was IV ampicillin until 06/01/2021 that seems to have changed to IV vancomycin with HD. -Continue IV vancomycin with HD  Anemia of chronic kidney disease: H&H stable.  -ESA and IV iron per nephrology  Essential hypertension: On metoprolol and clonidine patch at home.  Clonidine d/cd earlier.  Blood pressure now better with resumption of Toprol-XL.  Hypothyroidism: TSH within normal -Continue Synthroid 112 mcg daily  Controlled NIDDM-2 with ESRD: A1c 5.7%.  CBG within appropriate range.  CBG monitoring  discontinued.  History of seizures: Seems a stable. -Continue Keppra 500 mg daily and Keppra 250 mg three times weekly  History of right MCA CVA with residual left hemiparesis-  Pressure injury Mid buttocks, present on admission.  Goal of care counseling:   Poor long-term prognosis.   Seen by palliative care.  Still full code which I believe would pose more harm than benefit.  Patient is followed by [facility].  Previous hospitalist had extensive discussion with patient's son, [name] over the phone.  They discussed about the pros and cons of CPR and intubation.  He asked physicians opinion and he recommended DNR and DNI but he struggled to make a decision, and wanted to continue full code.  Nutrition Body mass index is 22.34 kg/m. Nutrition Problem: Increased nutrient needs Etiology: chronic illness (ESRD on HD) Signs/Symptoms: estimated needs Interventions: Nepro shake,MVI,Prostat Pressure Injury 04/29/21 Buttocks Mid Stage 2 -  Partial thickness loss of dermis presenting as a shallow open injury with a red, pink wound bed without slough. (Active) 04/29/21 0045 Location: Buttocks Location Orientation: Mid Staging: Stage 2 -  Partial thickness loss of dermis presenting as a shallow open injury with a red, pink wound bed without slough. Wound Description (Comments):  Present on Admission: Yes  DVT prophylaxis: heparin injection 5,000 Units Start: 04/30/21 2200   Code Status: Full Code  Family Communication:  None present at bedside.  Stated patient'S son over the phone. Status is: Inpatient  Remains inpatient appropriate because:Unsafe d/c plan   Dispo: The patient is from: Home              Anticipated d/c is to: Home              Patient currently is not medically stable to d/c.   Difficult to place patient No        Estimated body mass index is 22.34 kg/m as calculated from the following:   Height as of this encounter: 5\' 2"  (1.575 m).   Weight as of this encounter: 55.4 kg.  Pressure Injury 04/29/21 Buttocks Mid Stage 2 -  Partial thickness loss of dermis presenting as a shallow open injury with a red, pink wound bed without slough. (Active)  04/29/21 0045  Location: Buttocks  Location Orientation: Mid  Staging: Stage 2 -  Partial thickness  loss of dermis presenting as a shallow open injury with a red, pink wound bed without slough.  Wound Description (Comments):   Present on Admission: Yes     Nutritional status:  Nutrition Problem: Increased nutrient needs Etiology: chronic illness (ESRD on HD)   Signs/Symptoms: estimated needs   Interventions: Nepro shake,MVI,Prostat    Consultants:   Nephrology  Palliative care  Neurology  Procedures:   None  Antimicrobials:  Anti-infectives (From admission, onward)   Start     Dose/Rate Route Frequency Ordered Stop   05/07/21 1200  vancomycin (VANCOREADY) IVPB 500 mg/100 mL        500 mg 100 mL/hr over 60 Minutes Intravenous  Once 05/07/21 0803 05/07/21 1229   05/06/21 1200  vancomycin (VANCOCIN) IVPB 500 mg/100 ml premix        500 mg 100 mL/hr over 60 Minutes Intravenous Every M-W-F (Hemodialysis) 04/30/21 0727 06/01/21 2359   05/04/21 1800  vancomycin (VANCOREADY) IVPB 500 mg/100 mL  Status:  Discontinued        500 mg 100 mL/hr over 60 Minutes Intravenous  Once 05/04/21 1524 05/07/21 0803   05/04/21 1609  vancomycin (VANCOCIN)  500-5 MG/100ML-% IVPB       Note to Pharmacy: [name]  : cabinet override      05/04/21 1609 05/04/21 1613   05/01/21 1200  vancomycin (VANCOCIN) IVPB 500 mg/100 ml premix  Status:  Discontinued       Note to Pharmacy: For this week only she is going to receive hd on 5/26 and 5/28 so please give the Vancomycin. FOr next week back to mon wed fri   500 mg 100 mL/hr over 60 Minutes Intravenous Every M-W-F (Hemodialysis) 04/30/21 0723 04/30/21 0726   04/30/21 1800  vancomycin (VANCOCIN) IVPB 500 mg/100 ml premix       Note to Pharmacy: For this week only she is going to receive hd on 5/26 and 5/28 so please give the Vancomycin. FOr next week back to mon wed fri   500 mg 100 mL/hr over 60 Minutes Intravenous Every T-Th-Sa (1800) 04/30/21 0727 05/02/21 1839   04/29/21 1539  vancomycin (VANCOCIN) 500-5 MG/100ML-% IVPB        Note to Pharmacy: [name]  : cabinet override      04/29/21 1539 04/29/21 1727   04/29/21 1200  vancomycin (VANCOREADY) IVPB 500 mg/100 mL  Status:  Discontinued        500 mg 100 mL/hr over 60 Minutes Intravenous Every M-W-F (Hemodialysis) 04/28/21 1812 04/30/21 0723         Subjective: Seen and examined, slightly lethargic again today but able to have some conversation, she tells me her name and where she is at.  Was not able to answer any other question.  Objective: Vitals:   05/12/21 1220 05/12/21 1450 05/12/21 2112 05/13/21 0522  BP:  115/63 132/87 120/62  Pulse: 93  91 100  Resp:   17 17  Temp:   99.1 F (37.3 C) 97.6 F (36.4 C)  TempSrc:   Oral Axillary  SpO2: (!) 88%  95% 98%  Height:        Intake/Output Summary (Last 24 hours) at 05/13/2021 1042 Last data filed at 05/12/2021 1737 Gross per 24 hour  Intake 170 ml  Output --  Net 170 ml   Filed Weights    Examination:  General exam: Appears calm and comfortable but lethargic Respiratory system: Clear to auscultation. Respiratory effort normal. Cardiovascular system: S1 & S2 heard, RRR. No JVD, murmurs, rubs, gallops or clicks. No pedal edema. Gastrointestinal system: Abdomen is nondistended, soft and nontender. No organomegaly or masses felt. Normal bowel sounds heard. Central nervous system: Lethargic and oriented x2.  Has dense hemiplegia on the left side.   Data Reviewed: I have personally reviewed following labs and imaging studies  CBC: Recent Labs  Lab 05/07/21 0101 05/08/21 0445  WBC 10.1 6.8  NEUTROABS 7.0 4.7  HGB 7.9* 8.6*  HCT 25.2* 27.3*  MCV 91.3 91.6  PLT 197 371*   Basic Metabolic Panel: Recent Labs  Lab 05/07/21 0101 05/08/21 0445  NA 130* 136  K 4.8 3.7  CL 92* 99  CO2 26 25  GLUCOSE 121* 88  BUN 42* 19  CREATININE 6.30* 4.31*  CALCIUM 8.6* 8.8*  MG 2.1 2.1  PHOS 2.7 3.2   GFR: Estimated Creatinine Clearance: 8.8 mL/min (A) (by C-G formula based on SCr of  4.31 mg/dL (H)). Liver Function Tests: Recent Labs  Lab 05/07/21 0101 05/08/21 0445  AST 26 22  ALT 14 16  ALKPHOS 60 74  BILITOT 1.1 0.7  PROT 6.4* 7.1  ALBUMIN 2.4* 2.7*   No  results for input(s): LIPASE, AMYLASE in the last 168 hours. Recent Labs  Lab 05/07/21 1859  AMMONIA 26   Coagulation Profile: No results for input(s): INR, PROTIME in the last 168 hours. Cardiac Enzymes: No results for input(s): CKTOTAL, CKMB, CKMBINDEX, TROPONINI in the last 168 hours. BNP (last 3 results) No results for input(s): PROBNP in the last 8760 hours. HbA1C: No results for input(s): HGBA1C in the last 72 hours. CBG: Recent Labs  Lab 05/10/21 0812 05/10/21 1201 05/10/21 1718 05/11/21 1955 05/12/21 0742  GLUCAP 95 107* 104* 144* 90   Lipid Profile: Recent Labs    05/13/21 0429  LDLDIRECT 88.5   Thyroid Function Tests: No results for input(s): TSH, T4TOTAL, FREET4, T3FREE, THYROIDAB in the last 72 hours. Anemia Panel: No results for input(s): VITAMINB12, FOLATE, FERRITIN, TIBC, IRON, RETICCTPCT in the last 72 hours. Sepsis Labs: No results for input(s): PROCALCITON, LATICACIDVEN in the last 168 hours.  No results found for this or any previous visit (from the past 240 hour(s)).    Radiology Studies: MR BRAIN WO CONTRAST  Result Date: 05/12/2021 CLINICAL DATA:  Encephalopathy EXAM: MRI HEAD WITHOUT CONTRAST TECHNIQUE: Multiplanar, multiecho pulse sequences of the brain and surrounding structures were obtained without intravenous contrast. COMPARISON:  01/17/2021 FINDINGS: DWI, sagittal T1, and axial T2 sequences were attempted. There is significant motion artifact. Brain: Punctate foci of reduced diffusion are present in the right temporal lobe, right thalamus, left thalamus, and left parietal lobe. Large right MCA territory infarction. Wallerian degeneration along the right cerebral peduncle. Additional scattered smaller chronic infarcts. No significant mass effect. No  hydrocephalus. Vascular: Major vessel flow voids at the skull base grossly preserved. Skull and upper cervical spine: Normal marrow signal is grossly preserved. Sinuses/Orbits: Paranasal sinuses are aerated. Orbits are grossly unremarkable. Other: None. IMPRESSION: Significantly motion degraded study. Patient could not tolerate all sequences. Few punctate acute infarcts of right temporal lobe, bilateral thalamus, and left parietal lobe. Electronically Signed   By: Macy Mis M.D.   On: 05/12/2021 18:51    Scheduled Meds: . (feeding supplement) PROSource Plus  30 mL Oral TID BM  . aspirin  81 mg Oral Daily  . atorvastatin  40 mg Oral Daily  . Chlorhexidine Gluconate Cloth  6 each Topical Q0600  . Chlorhexidine Gluconate Cloth  6 each Topical Q0600  . darbepoetin (ARANESP) injection - DIALYSIS  25 mcg Intravenous Q Mon-HD  . feeding supplement (NEPRO CARB STEADY)  237 mL Oral BID BM  . heparin  5,000 Units Subcutaneous Q8H  . latanoprost  1 drop Both Eyes QHS  . letrozole  2.5 mg Oral Daily  . levETIRAcetam  500 mg Oral Q1500   And  . levETIRAcetam  250 mg Oral Once per day on Mon Wed Fri  . levothyroxine  112 mcg Oral QAC breakfast  . metoprolol succinate  50 mg Oral Daily  . multivitamin  1 tablet Oral QHS  . vancomycin  500 mg Intravenous Q M,W,F-HD   Continuous Infusions:   LOS: 15 days   Time spent: 35 -minute   Darliss Cheney, MD [facility]  05/13/2021, 10:42 AM   How to contact the [facility] Attending or Consulting provider [place] or covering provider during after hours [place], for this patient?  1. Check the care team in [facility] and look for a) attending/consulting [facility] provider listed and b) the [facility] team listed. Page or secure chat [phone]. 2. Log into [email or web address] and use [place]'s universal password to access.  If you do not have the password, please contact the hospital operator. 3. Locate the [facility] provider you are looking for under [facility] and page to a  number that you can be directly reached. 4. If you still have difficulty reaching the provider, please page the [facility] (Director on Call) for the Hospitalists listed on amion for assistance.

## 2021-05-13 NOTE — Progress Notes (Addendum)
STROKE TEAM PROGRESS NOTE    Interval History  No acute events overnight.  She continues to be confused on exam but does answer LOC questions and follows simple commands.   Spoke to son over the phone regarding her stroke, stroke etiology and differentials for encephalopathy    Pertinent Lab Work and Imaging    05/07/21 CT Head WO IV Contrast 1. No acute intracranial abnormality. 2. Old right MCA territory infarct and findings of chronic microvascular ischemia.  05/12/21 MRI Brain WO IV Contrast Significantly motion degraded study. Patient could not tolerate all Sequences. Few punctate acute infarcts of right temporal lobe, bilateral thalamus, and left parietal lobe.  05/13/21 MR Angio Head  1. Severely degraded study due to motion artifact. Most images are essentially nondiagnostic. 2. Flow related enhancement seen in the intracranial internal carotid arteries, proximal bilateral M1/MCA segments and A1/ACA segments. 3. Flow related enhancement in the bilateral vertebral arteries and basilar artery. 4. Remainder of the vessels cannot be evaluated.  05/13/21 VAS US Carotid  Pending   05/13/21 Echocardiogram Complete  1. Left ventricular ejection fraction, by estimation, is 55 to 60%. The left ventricle has normal function. The left ventricle has no regional wall motion abnormalities. There is mild left ventricular hypertrophy. Left ventricular diastolic parameters are consistent with Grade I diastolic dysfunction (impaired relaxation).  2. Right ventricular systolic function is normal. The right ventricular size is normal. Tricuspid regurgitation signal is inadequate for assessing  PA pressure.  3. Left atrial size was mildly dilated.  4. The mitral valve is normal in structure. No evidence of mitral valve regurgitation. No evidence of mitral stenosis.  5. The aortic valve is normal in structure. Aortic valve regurgitation is  trivial. No aortic stenosis is present.  6. The  inferior vena cava is normal in size with greater than 50%  respiratory variability, suggesting right atrial pressure of 3 mmHg.    Physical Examination   Constitutional: Frail African American woman laying in bed  Cardiovascular: Normal RR Respiratory: No increased WOB   Mental status: Oriented to self, year, place not to month. States current President is Kirsten Mcmahon but can not remember prior Kirsten Mcmahon. Follows command to show two fingers and thumb.  Speech: Fluent speech with hesitancy and perseveration's Cranial nerves: EOMI, VFF, Right facial droop, Tongue midline   Motor: 3/5 RUE. LUE + BLE 0/5 and notably spastic.  Coordination: FNF intact with right hand  Gait: Deferred due to patient acuity   Assessment and Plan   Kirsten Mcmahon is a 75 y.o. female w/pmh of GERD, Glaucoma, HTN, Hypothyroidism, DM2, overactive bladder, hx of prior R insular and frontal operculum infarct with encephalomalacia and gliosis with residual left sided weakness, old cerebellar infarcts, malignant R breast cancer, recent streptococcus G bacteremia prior to hospitalization and on IV Vancomycin who presents with encephalopathy, found to have punctate acute strokes via encephalopathy work up for which neurology was consulted.   #Right temporal lobe + Bilateral Thalamus + L Parietal Lobe Stroke-given strokes in multiple vascular territories possibly of cardioembolic stroke and recent history of sepsis possibility of endocarditis. #History of R MCA Stroke   Her MRI Brain revealed small punctuate strokes in multiple territories. Echocardiogram w/normal EF 55-60 %, LA mildly dilated and no atrial level shunt detected by color flow Doppler. Stroke labs w/LDL 88.5 A1C pending. Vessel imaging done with MRA Head + CUS; MRA Head images were severely degraded due to motion with most images non diagnostic. CUS is pending. At this  time her stroke etiology is cryptogenic, suspect cardioembolic given LA is mildly dilated.  Spoke to patients son regarding her prior stroke work up- she has not had cardiac monitoring in the past.  - Cardiac event monitor at discharge to evaluate for atrial fibrillation if she can tolerate this. ( if encephalopathy continues unsure if she will tolerate)  - Start Aspirin 81 mg and Atorvastatin 40 mg for secondary stroke prevention  -At discharge please place ambulatory referral to neurology for stroke follow up   #Encephalopathy  Initially this admission encephalopathy was assumed to be due to missed dialysis however there was no subsequent improvement with dialysis. Unclear etiology, likely multifactorial given her significant history of prior large R MCA stroke, ESRD and significant pain and prolonged hospitalization. Streptococcal bacteremia being addressed by primary team. She does not have a white count and has not fevered recently. Acute strokes are too small to be contributing to encephalopathy.  - Recommend enforcing delirium precautions to help with encephalopathy; Keep patient awake and alert during the day with the blinds open and mental stimulation with nursing staff. Can start melatonin at night if she is not sleeping well in case she has flipped her sleep wake cycle.   #Hypertension She has a history of HTN and takes at home. Currently blood pressure is trending normotensive. No need for permissive HTN at this time, maintain normotensive blood pressures.   #Hyperlipidemia From a stroke prevention stand point, the LDL goal is < 70. LLD 88.5 thus Atorvastatin 40 mg was initiated.   #History of DMII  She has a history of diabetes, A1C pending   Hospital day # [place], NP  [facility] Nurse Practitioner Patient seen and discussed with attending physician Dr.  I have personally obtained history,examined this patient, reviewed notes, independently viewed imaging studies, participated in medical decision making and plan of care.ROS completed by me  personally and pertinent positives fully documented  I have made any additions Mcmahon clarifications directly to the above note. Agree with note above.  Patient had recent sepsis and infection and MRI showed multiple strokes in different vascular distribution hence the possibility of endocarditis exists and would recommend checking TEE and blood cultures.  The patient is clinical degree of encephalopathy seems out of proportion to the small punctate stroke that she has.  Also check EEG for seizures.  Continue ongoing stroke work-up.  Discussed with patient and Dr. Einar Grad.  Greater than 50% time during this 35-minute visit was spent on counseling and coordination of care about her strokes and discussion with care team.  Antony Contras, MD Medical Director [place] Pager: [phone] 05/13/2021 5:00 PM   To contact Stroke Continuity provider, please refer to [email or web address] After hours, contact General Neurology

## 2021-05-13 NOTE — Plan of Care (Signed)
  Problem: Education: Goal: Knowledge of disease or condition will improve 05/13/2021 1651 by Terence Lux, RN Outcome: Not Progressing 05/13/2021 1650 by Terence Lux, RN Outcome: Not Progressing

## 2021-05-13 NOTE — Progress Notes (Signed)
Nutrition Follow-up  DOCUMENTATION CODES:   Not applicable  INTERVENTION:   -Continue Nepro Shake po BID, each supplement provides 425 kcal and 19 grams protein -Continue feeding assistance with meals -Continue renal MVI daily -Magic cup TID with meals, each supplement provides 290 kcal and 9 grams of protein  NUTRITION DIAGNOSIS:   Increased nutrient needs related to chronic illness (ESRD on HD) as evidenced by estimated needs.  Ongoing  GOAL:   Patient will meet greater than or equal to 90% of their needs  Progressing   MONITOR:   PO intake,Supplement acceptance,Labs,Weight trends,Skin,I & O's  REASON FOR ASSESSMENT:   Malnutrition Screening Tool    ASSESSMENT:   Kirsten Mcmahon is a 75 y.o. female with medical history significant of ESRD, having just been initiated on dialysis earlier this month, GERD, hypertension, hypothyroidism, non-insulin-dependent diabetes type 2, hyperlipidemia, CVA history, and seizure disorder who presents today from dialysis center after refusing dialysis stating "I already had dialysis today before I was discharged from the hospital" per report  5/26- s/p HD catheter exchange   Reviewed I/O's: +270 ml x 24 hours and -4.3 L since admission  Pt receiving personal care at time of visit.   Pt's intake has improved since last visit. Noted meal completions 40-60%. Pt has been consuming Nepro supplements per MAR.   Per nephrology notes, plan to discharge home. Pt will require a sitter at outpatient HD.   Medications reviewed and include aranesp and keppra.  Labs reviewed.   Diet Order:   Diet Order            Diet renal with fluid restriction Fluid restriction: 1200 mL Fluid; Room service appropriate? Yes; Fluid consistency: Thin  Diet effective now                 EDUCATION NEEDS:   Not appropriate for education at this time  Skin:  Skin Assessment: Skin Integrity Issues: Skin Integrity Issues:: Stage II Stage II:  buttocks Other: MASD to bilateral gluteal folds  Last BM:  05/12/21  Height:   Ht Readings from Last 1 Encounters:  04/29/21 5\' 2"  (1.575 m)    Weight:   Wt Readings from Last 1 Encounters:  05/13/21 56.2 kg    Ideal Body Weight:  50 kg  BMI:  Body mass index is 22.66 kg/m.  Estimated Nutritional Needs:   Kcal:  1650-1850  Protein:  75-90 grams  Fluid:  1000 ml + UOP    [name], RD, LDN, CDCES Registered Dietitian II Certified Diabetes Care and Education Specialist Please refer to [facility] for RD and/or RD on-call/weekend/after hours pager

## 2021-05-13 NOTE — Progress Notes (Signed)
OT Cancellation Note  Patient Details Name: Kirsten Mcmahon MRN: [id number] DOB: December 26, 1945   Cancelled Treatment:    Reason Eval/Treat Not Completed: Patient at procedure or test/ unavailable;Other (comment) pt out of room at HD, will check back as time allows.  Corinne Ports K., COTA/L Acute Rehabilitation Services [phone]   Precious Haws 05/13/2021, 2:42 PM

## 2021-05-13 NOTE — Progress Notes (Signed)
EEG completed, results pending. 
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
## 2021-05-13 NOTE — Consult Note (Addendum)
NEUROLOGY CONSULTATION NOTE   Date of service: May 13, 2021 Patient Name: Kirsten Mcmahon MRN:  [id number] DOB:  06-Jul-1946 Reason for consult: "Stroke on MRI" Requesting Provider: Darliss Cheney, MD _ _ _   _ __   _ __ _ _  __ __   _ __   __ _  History of Present Illness  Kirsten Mcmahon is a 75 y.o. female with PMH significant for GERD, Glaucoma, HTN, Hypothyroidism, DM2, overactive bladder, hx of prior R insular and frontal operculum infarct with encephalomalacia and gliosis with residual left sided weakness, old cerebellar infarcts, malignant R breast cancer, recent streptococcus G bacteremia prior to hospitalization and on IV Vancomycin who is admitted with encephalopathy presumed to be due to missed dialysis sessions with no subsequent improvement with dialysis. B12 normal, TSH normal RPR negative.  She had MRI Brain as part of workup for encephalopathy which was notable for few punctate acute infarcts of the Right temporal lobe and BL thalams and L parietal lobe.  Neurology was consulted for further evaluation.  Patient unable to provide any meaningful history secondary to encephalopathy. She is notably in pain everywhere and screams with light touch in most of her body.   ROS   Unable to obtain a reliable ROS due to encephalopathy and poor attention.  Past History   Past Medical History:  Diagnosis Date  . Cancer [facility]) 2017   Right breast  . Depression   . GERD (gastroesophageal reflux disease)   . Glaucoma   . Hemiparesis ([place])    left side  . High cholesterol   . History of seizure    x 1 - after a spider bite  . History of stroke with residual deficit    left-side weakness  . Hypertension    states BP under control with meds., has been on med. x 2 yr.  . Hypothyroidism   . Non-insulin dependent type 2 diabetes mellitus ([place])   . Overactive bladder   . Stroke [facility])    1998 weakness on left side   Past Surgical History:  Procedure Laterality Date  . ABDOMINAL  HYSTERECTOMY     complete  . BREAST BIOPSY Left 08/03/2018   Benign adipose tissue  . BREAST EXCISIONAL BIOPSY Right 2014   Positive  . BREAST LUMPECTOMY Right   . CATARACT EXTRACTION W/ INTRAOCULAR LENS IMPLANT Left   . CEREBRAL ANEURYSM REPAIR  1998  . DIALYSIS/PERMA CATHETER INSERTION N/A 04/10/2021   Procedure: DIALYSIS/PERMA CATHETER INSERTION;  Surgeon: Algernon Huxley, MD;  Location: [place] [facility];  Service: Cardiovascular;  Laterality: N/A;  . IR FLUORO GUIDE CV LINE RIGHT  04/30/2021  . PICC LINE INSERTION    . THYROID LOBECTOMY Right 07/15/2017   Procedure: RIGHT THYROID LOBECTOMY;  Surgeon: Armandina Gemma, MD;  Location: [facility] OR;  Service: General;  Laterality: Right;   Family History  Problem Relation Age of Onset  . Cancer Brother        possible prostate cancer per her daughter  . Breast cancer Neg Hx    Social History   Socioeconomic History  . Marital status: Single    Spouse name: Not on file  . Number of children: Not on file  . Years of education: Not on file  . Highest education level: Not on file  Occupational History  . Not on file  Tobacco Use  . Smoking status: Never Smoker  . Smokeless tobacco: Never Used  Vaping Use  . Vaping Use: Never  used  Substance and Sexual Activity  . Alcohol use: No  . Drug use: No  . Sexual activity: Never    Birth control/protection: Post-menopausal  Other Topics Concern  . Not on file  Social History Narrative   Lives with son and wife   Social Determinants of Health   Financial Resource Strain: Not on file  Food Insecurity: Not on file  Transportation Needs: Not on file  Physical Activity: Not on file  Stress: Not on file  Social Connections: Not on file   Allergies  Allergen Reactions  . Contrast Media [Iodinated Diagnostic Agents]     Other reaction(s): NO ALLERGY  . Latex     Other reaction(s): NO ALLERGY  . Shellfish-Derived Products     Other reaction(s): NO ALLERGY  . Levemir [Insulin Detemir]  Itching    Medications   Medications Prior to Admission  Medication Sig Dispense Refill Last Dose  . aspirin EC 81 MG tablet Take 81 mg by mouth daily.   04/27/2021 at Unknown time  . cholecalciferol (VITAMIN D) 1000 units tablet Take 1,000 Units by mouth daily.   04/27/2021 at Unknown time  . cloNIDine (CATAPRES - DOSED IN MG/24 HR) 0.2 mg/24hr patch Place 0.2 mg onto the skin once a week. Wednesday   Past Week at Unknown time  . JANUVIA 25 MG tablet Take 25 mg by mouth daily.   04/27/2021 at Unknown time  . KEPPRA XR 500 MG 24 hr tablet Take 500 mg by mouth daily.   04/27/2021 at Unknown time  . latanoprost (XALATAN) 0.005 % ophthalmic solution Place 1 drop into both eyes at bedtime. 2.5 mL 0 04/27/2021 at Unknown time  . letrozole (FEMARA) 2.5 MG tablet Take 1 tablet (2.5 mg total) by mouth daily. 90 tablet 3 04/27/2021 at Unknown time  . levothyroxine (SYNTHROID, LEVOTHROID) 112 MCG tablet Take 1 tablet (112 mcg total) by mouth daily before breakfast. 90 tablet 0 04/27/2021 at Unknown time  . metoprolol succinate (TOPROL-XL) 25 MG 24 hr tablet Take 50 mg by mouth daily.   04/27/2021 at 0800  . vancomycin (VANCOREADY) 500 MG/100ML IVPB Inject 100 mLs (500 mg total) into the vein every Monday, Wednesday, and Friday with hemodialysis. 4000 mL 0 unknown at unknown     Vitals   Vitals:   05/12/21 1210 05/12/21 1220 05/12/21 1450 05/12/21 2112  BP:   115/63 132/87  Pulse: 90 93  91  Resp:    17  Temp:    99.1 F (37.3 C)  TempSrc:    Oral  SpO2: 90% (!) 88%  95%  Height:         Body mass index is 22.34 kg/m.  Physical Exam   General: Laying comfortably in bed;head tilted to her R. in no acute distress. HENT: Normal oropharynx and mucosa. Normal external appearance of ears and nose. Neck: Supple, no pain or tenderness CV: No JVD. No peripheral edema. Pulmonary: Symmetric Chest rise. Normal respiratory effort. Abdomen: Soft to touch, non-tender. Ext: No cyanosis, edema, or  deformity Skin: No rash. Normal palpation of skin.  Musculoskeletal: Normal digits and nails by inspection. No clubbing.  Neurologic Examination  Mental status/Cognition: Alert, oriented to self only, poor attention. Speech/language: Follows basic commands intermittently, unable to name objects, unable to repeat. Cranial nerves:   CN II Pupils equal and reactive to light, no VF deficits   CN III,IV,VI EOM intact, no gaze preference or deviation, no nystagmus   CN V normal sensation in V1,  V2, and V3 segments bilaterally   CN VII no asymmetry, no nasolabial fold flattening   CN VIII normal hearing to speech   CN IX & X normal palatal elevation, no uvular deviation   CN XI    CN XII midline tongue protrusion   Unable to assess for meningismus due to pain all over her body and was really upset on attempt to evaluate.  Motor:  Muscle bulk: poor, tone mild increased vs guarding due to pain in LUE. She is able to squeeze my fingers with 5/5 strength grip strength in RUE. She is able to wiggles toes in BL lower extremities.  Unable to do a detailed strength exam secondary to pain all over her body.  Sensory: Intact throughout  Reflexes: Unable to assess secondary to full body pain  Coordination/Complex Motor:  Unable to assess but no obvious ataxia or tremor noted in RUE.   Labs   CBC:  Recent Labs  Lab 05/07/21 0101 05/08/21 0445  WBC 10.1 6.8  NEUTROABS 7.0 4.7  HGB 7.9* 8.6*  HCT 25.2* 27.3*  MCV 91.3 91.6  PLT 197 128*    Basic Metabolic Panel:  Lab Results  Component Value Date   NA 136 05/08/2021   K 3.7 05/08/2021   CO2 25 05/08/2021   GLUCOSE 88 05/08/2021   BUN 19 05/08/2021   CREATININE 4.31 (H) 05/08/2021   CALCIUM 8.8 (L) 05/08/2021   GFRNONAA 10 (L) 05/08/2021   GFRAA 34 (L) 08/01/2018   Lipid Panel: No results found for: LDLCALC HgbA1c:  Lab Results  Component Value Date   HGBA1C 5.7 (H) 04/10/2021   Urine Drug Screen: No results found for:  LABOPIA, COCAINSCRNUR, LABBENZ, AMPHETMU, THCU, LABBARB  Alcohol Level No results found for: [place]  CT Head without contrast: CTH was negative for a large hypodensity concerning for a large territory infarct or hyperdensity concerning for an ICH. Notable for old R MCA stroke.  MRI Brain: Significantly motion degraded study. Patient could not tolerate all sequences. Few punctate acute infarcts of right temporal lobe, bilateral thalamus, and left parietal lobe.  rEEG: prior from 01/19/21 This study is showed evidence of potential epileptogenicity arising from bilateral frontotemporal region, There is also cortical dysfunction due to underlying stroke and craniotomy in right frontal region. No seizures were seen throughout the recording.   Impression   Kirsten Mcmahon is a 75 y.o. female with PMH significant for GERD, Glaucoma, HTN, Hypothyroidism, DM2, overactive bladder, hx of prior R insular and frontal operculum infarct with encephalomalacia and gliosis with residual left sided weakness, old cerebellar infarcts, malignant R breast cancer, recent streptococcus G bacteremia prior to hospitalization and on IV Vancomycin who is admitted with encephalopathy presumed to be due to missed dialysis sessions with no significant improvement after dialysis. We were asked to evaluate her for any neurological cause for encephalopathy and for evaluation for the noted strokes.  Difficult to clinically evaluate for meningismus, but other obvious signs of meningitis. She has been afebrile, no leukocytosis, has been on Vancomycin for her history of Streptococcus infection.  I think her encephalopathy is probably multifactorial given her significant history of prior large R MCA stroke, ESRD and significant pain and prolonged hospitalization. I dont think the puncate strokes explain the encephalopathy. Encephalopathy labs with B12, folate, TSH, RPR are negative.  Would recommend getting a routine EEG here to  evaluate for potential seizures.  Impression: Encephalopathy - likely toxic metabolic and multifactorial.  Recommendations  - Recommend routine EEG -  Recommend Vascular imaging with MRA Angio Head without contrast and US Carotid doppler - Recommend obtaining TTE - Recommend obtaining Lipid panel with LDL - Please start statin if LDL > 70 - Recommend HbA1c - Antithrombotic - aspirin 81mg  daily. - Recommend DVT ppx - SBP goal - gradual normotension - Recommend Telemetry monitoring for arrythmia - Recommend bedside swallow screen prior to PO intake. - Stroke education booklet - Recommend PT/OT/SLP consult  ______________________________________________________________________   Thank you for the opportunity to take part in the care of this patient. If you have any further questions, please contact the neurology consultation attending.  Signed,  [place] Pager Number [phone] _ _ _   _ __   _ __ _ _  __ __   _ __   __ _

## 2021-05-13 NOTE — Procedures (Signed)
Patient Name: Kirsten Mcmahon  MRN: [id number]  Epilepsy Attending: Lora Havens  Referring Physician/Provider: Dr Donnetta Simpers Date: 05/13/2021  Duration: 26.07 mins  Patient history: 75 year old female with altered mental status.  EEG to evaluate for seizures.  Level of alertness: Awake  AEDs during EEG study: Keppra  Technical aspects: This EEG study was done with scalp electrodes positioned according to the 10-20 International system of electrode placement. Electrical activity was acquired at a sampling rate of 500Hz  and reviewed with a high frequency filter of 70Hz  and a low frequency filter of 1Hz . EEG data were recorded continuously and digitally stored.   Description:  The posterior dominant rhythm consists of 8 Hz activity of moderate voltage (25-35 uV) seen predominantly in posterior head regions, symmetric and reactive to eye opening and eye closing.  EEG showed continuous generalized 3 to 5 Hz theta-delta slowing.  There is also sharply contoured 3 to 5 Hz theta-delta slowing in right frontal region consistent with underlying breech artifact.  Sharp transients were seen right parietal region. Hyperventilation and photic stimulation were not performed.     ABNORMALITY -Continuous slow, generalized - Breach artifact, right frontal region  IMPRESSION: This study is suggestive of cortical dysfunction in right frontal region consistent with prior craniotomy.  Additionally there is evidence of moderate diffuse encephalopathy, nonspecific etiology.  No seizures or definite epileptiform discharges were seen throughout the recording.   Ammon Muscatello [name]

## 2021-05-14 ENCOUNTER — Inpatient Hospital Stay (HOSPITAL_COMMUNITY): Payer: Medicare (Managed Care)

## 2021-05-14 ENCOUNTER — Other Ambulatory Visit: Payer: Self-pay

## 2021-05-14 DIAGNOSIS — R4182 Altered mental status, unspecified: Secondary | ICD-10-CM

## 2021-05-14 LAB — VANCOMYCIN, RANDOM: Vancomycin Rm: 24

## 2021-05-14 LAB — HEMOGLOBIN A1C
Hgb A1c MFr Bld: 5.6 % (ref 4.8–5.6)
Mean Plasma Glucose: 114 mg/dL

## 2021-05-14 LAB — HEPATITIS B SURFACE ANTIBODY, QUANTITATIVE: Hep B S AB Quant (Post): <3.1 m[IU]/mL — ABNORMAL LOW (ref >9.9)

## 2021-05-14 LAB — GLUCOSE, CAPILLARY: Glucose-Capillary: 85 mg/dL (ref 70–99)

## 2021-05-14 NOTE — Progress Notes (Signed)
Carotid artery duplex completed. Refer to "CV Proc" under chart review to view preliminary results.  05/14/2021 10:04 AM Kelby Aline., MHA, RVT, RDCS, RDMS

## 2021-05-14 NOTE — Progress Notes (Signed)
Patient is extremely lethargic today. Per other staff that have worked with her previous days, she was talking on phone, making jokes with staff, and even yelling out at certain times. Now, patient only responds to [place] loudly calling her name or by touch. She also is not eating today even with full assist. Have notified Dr.Pahwani.

## 2021-05-14 NOTE — Progress Notes (Signed)
STROKE TEAM PROGRESS NOTE    Interval History  No acute events overnight. She appears more alert and interactive and less confused.  She is oriented to place and person into her condition TEE scheduled for tomorrow.  EEG shows generalized slowing without definite seizure activity.  Carotid ultrasound is pending.   Pertinent Lab Work and Imaging    05/07/21 CT Head WO IV Contrast 1. No acute intracranial abnormality. 2. Old right MCA territory infarct and findings of chronic microvascular ischemia.  05/12/21 MRI Brain WO IV Contrast Significantly motion degraded study. Patient could not tolerate all Sequences. Few punctate acute infarcts of right temporal lobe, bilateral thalamus, and left parietal lobe.  05/13/21 MR Angio Head  1. Severely degraded study due to motion artifact. Most images are essentially nondiagnostic. 2. Flow related enhancement seen in the intracranial internal carotid arteries, proximal bilateral M1/MCA segments and A1/ACA segments. 3. Flow related enhancement in the bilateral vertebral arteries and basilar artery. 4. Remainder of the vessels cannot be evaluated.  05/13/21 VAS US Carotid  Pending   05/13/21 Echocardiogram Complete   1. Left ventricular ejection fraction, by estimation, is 55 to 60%. The left ventricle has normal function. The left ventricle has no regional wall motion abnormalities. There is mild left ventricular hypertrophy. Left ventricular diastolic parameters are consistent with Grade I diastolic dysfunction (impaired relaxation).   2. Right ventricular systolic function is normal. The right ventricular size is normal. Tricuspid regurgitation signal is inadequate for assessing  PA pressure.   3. Left atrial size was mildly dilated.   4. The mitral valve is normal in structure. No evidence of mitral valve regurgitation. No evidence of mitral stenosis.   5. The aortic valve is normal in structure. Aortic valve regurgitation is  trivial. No aortic  stenosis is present.   6. The inferior vena cava is normal in size with greater than 50%  respiratory variability, suggesting right atrial pressure of 3 mmHg.    Physical Examination   Constitutional: Frail African American woman laying in bed  Cardiovascular: Normal RR Respiratory: No increased WOB   Mental status: Oriented to self, year, place and person.  Follows command to show two fingers and thumb.  Speech: Fluent speech with hesitancy and perseveration's Cranial nerves: EOMI, VFF, Right facial droop, Tongue midline   Motor: 3/5 RUE. LUE + BLE 0/5 and notably spastic.  Trace withdrawal in right lower extremity to painful stimuli. Coordination: FNF intact with right hand  Gait: Deferred   Assessment and Plan   [name] is a 75 y.o. female w/pmh of GERD, Glaucoma, HTN, Hypothyroidism, DM2, overactive bladder, hx of prior R insular and frontal operculum infarct with encephalomalacia and gliosis with residual left sided weakness, old cerebellar infarcts, malignant R breast cancer, recent streptococcus G bacteremia prior to hospitalization and on IV Vancomycin who presents with encephalopathy, found to have punctate acute strokes via encephalopathy work up for which neurology was consulted.   #Right temporal lobe + Bilateral Thalamus + L Parietal Lobe Stroke-given strokes in multiple vascular territories possibly of cardioembolic stroke and recent history of sepsis possibility of endocarditis. #History of R MCA Stroke   Her MRI Brain revealed small punctuate strokes in multiple territories. Echocardiogram w/normal EF 55-60 %, LA mildly dilated and no atrial level shunt detected by color flow Doppler. Stroke labs w/LDL 88.5 A1C pending. Vessel imaging done with MRA Head + CUS; MRA Head images were severely degraded due to motion with most images non diagnostic. CUS is  pending. At this time her stroke etiology is cryptogenic, suspect cardioembolic given LA is mildly dilated. Spoke  to patients son regarding her prior stroke work up- she has not had cardiac monitoring in the past.  - Cardiac event monitor at discharge to evaluate for atrial fibrillation if she can tolerate this. ( if encephalopathy continues unsure if she will tolerate)  - Start Aspirin 81 mg and Atorvastatin 40 mg for secondary stroke prevention  -At discharge please place ambulatory referral to neurology for stroke follow up   #Encephalopathy  Initially this admission encephalopathy was assumed to be due to missed dialysis however there was no subsequent improvement with dialysis. Unclear etiology, likely multifactorial given her significant history of prior large R MCA stroke, ESRD and significant pain and prolonged hospitalization. Streptococcal bacteremia being addressed by primary team. She does not have a white count and has not fevered recently. Acute strokes are too small to be contributing to encephalopathy.  -    #Hypertension She has a history of HTN and takes at home. Currently blood pressure is trending normotensive. No need for permissive HTN at this time, maintain normotensive blood pressures.   #Hyperlipidemia From a stroke prevention stand point, the LDL goal is < 70. LLD 88.5 thus Atorvastatin 40 mg was initiated.   #History of DMII  She has a history of diabetes, A1C pending   Hospital day # 16   Patient had recent sepsis and infection and MRI showed multiple strokes in different vascular distribution hence the possibility of endocarditis exists and would recommend checking TEE and blood cultures.  The patient is clinical degree of encephalopathy seems out of proportion to the small punctate stroke that she has. .  Discussed with patient and Dr. Einar Grad.  Greater than 50% time during this 25-minute visit was spent on counseling and coordination of care about her strokes and discussion with care team.  Antony Contras, MD Medical Director [place] Pager:  [phone] 05/14/2021 1:50 PM   To contact Stroke Continuity provider, please refer to [email or web address] After hours, contact General Neurology

## 2021-05-14 NOTE — Progress Notes (Signed)
Occupational Therapy Treatment Patient Details Name: Kirsten Mcmahon MRN: [id number] DOB: [birth year] Today's Date: 05/14/2021    History of present illness 75yo female admitted 04/28/21 from an HD center after refusing dialysis. Also thought to have acute metabolic encepthalopathy. PMH breast CA, CVA with L residual hemiplegia, seizures, HTN, R fronto-temporal craniotomy   OT comments  Pt seen for follow-up session this time together with PT to address bed mobility, sitting baalnce, and UB ADL goals. Pt able to tolerate sitting EOB 10+minutes with up to mod A for static sitting balance. Also removed palm guard and performed skin check of L hand with no changes noted. Placed educational handouts for left palm guard in the room and provided a copy for pt to take home. D/c plan remains appropriate.    Follow Up Recommendations  Supervision/Assistance - 24 hour;SNF    Equipment Recommendations  3 in 1 bedside commode;Hospital bed    Recommendations for Other Services      Precautions / Restrictions Precautions Precautions: Fall;Other (comment) Precaution Comments: R perm-cath; history of R fronto-temporal craniectomy with musculocutaneous flap over site. L trunk and extremity tone Restrictions Weight Bearing Restrictions: No       Mobility Bed Mobility Overal bed mobility: Needs Assistance Bed Mobility: Supine to Sit;Sit to Supine     Supine to sit: Total assist;+2 for physical assistance Sit to supine: Total assist;+2 for physical assistance   General bed mobility comments: totalAx2 to get to/from EOB. Somewhat limited by pain when attempting mobility, able to reduce at least somewhat by having her actively look left and putting bed all the way in chair position 5-10 degrees at a time befor pivoting to EOB    Transfers                 General transfer comment: unable/fatigue    Balance Overall balance assessment: Needs assistance Sitting-balance support: Single  extremity supported;Feet supported Sitting balance-[name] Scale: Poor Sitting balance - Comments: varied from min guard to ModA Postural control: Right lateral lean;Posterior lean     Standing balance comment: unable to assess                           ADL either performed or assessed with clinical judgement   ADL Overall ADL's : Needs assistance/impaired Eating/Feeding: Total assistance Eating/Feeding Details (indicate cue type and reason): seated EOB with up to mod A for sitting balance.                                   General ADL Comments: Pt completed bed mobility with total +2 assist. Able to tolerate EOB position for 10+minutes with up to mod A 2/2 R lateral lean.     Vision       Perception     Praxis      Cognition Arousal/Alertness: Lethargic Behavior During Therapy: Flat affect Overall Cognitive Status: No family/caregiver present to determine baseline cognitive functioning                                 General Comments: pleasant and cooperative, slow processing and decreased problem solving but agreeable to trying new things with therapy today. Able to respond appropriately maybe 60-70% of the time with delayed responses        Exercises Other Exercises Other Exercises: PROM wrist  and digits. Increased tone in L thumb. Pt unable to tolerate PROM elbow and shoulder this session 2/2 pain.   Shoulder Instructions       General Comments      Pertinent Vitals/ Pain       Pain Assessment: Faces Faces Pain Scale: Hurts little more Pain Location: generalized, with movement Pain Descriptors / Indicators: Grimacing Pain Intervention(s): Monitored during session;Limited activity within patient's tolerance  Home Living                                          Prior Functioning/Environment              Frequency  Min 2X/week        Progress Toward Goals  OT Goals(current goals can now be  found in the care plan section)  Progress towards OT goals: Progressing toward goals  Acute Rehab OT Goals Patient Stated Goal: none stated OT Goal Formulation: Patient unable to participate in goal setting Time For Goal Achievement: 05/26/21 Potential to Achieve Goals: Fair ADL Goals Pt Will Perform Eating: with max assist;sitting;bed level Pt Will Perform Grooming: with max assist;sitting;bed level Pt Will Transfer to Toilet: with min assist;stand pivot transfer;bedside commode Additional ADL Goal #1: Caregiver will be independent with L palm guard splint management and wearing schedule.  Plan Discharge plan remains appropriate;Frequency remains appropriate    Co-evaluation    PT/OT/SLP Co-Evaluation/Treatment: Yes Reason for Co-Treatment: To address functional/ADL transfers;For patient/therapist safety;Complexity of the patient's impairments (multi-system involvement)   OT goals addressed during session: ADL's and self-care;Strengthening/ROM      AM-PAC OT "6 Clicks" Daily Activity     Outcome Measure   Help from another person eating meals?: Total Help from another person taking care of personal grooming?: Total Help from another person toileting, which includes using toliet, bedpan, or urinal?: Total Help from another person bathing (including washing, rinsing, drying)?: Total Help from another person to put on and taking off regular upper body clothing?: Total Help from another person to put on and taking off regular lower body clothing?: Total 6 Click Score: 6    End of Session Equipment Utilized During Treatment: Other (comment) (L palm guard)  OT Visit Diagnosis: Other abnormalities of gait and mobility (R26.89);Other symptoms and signs involving cognitive function   Activity Tolerance Patient tolerated treatment well;Patient limited by lethargy   Patient Left in bed;with call bell/phone within reach   Nurse Communication Other (comment) (LUE pain)         Time: 3818-2993 OT Time Calculation (min): 34 min  Charges: OT General Charges $OT Visit: 1 Visit OT Treatments $Self Care/Home Management : 8-22 mins   Tyrone Schimke, OT Acute Rehabilitation Services Pager: [phone] Office: [place], [place] 05/14/2021, 1:12 PM

## 2021-05-14 NOTE — Progress Notes (Signed)
    [facility] has been requested to perform a transesophageal echocardiogram on 05/14/2021 for stroke.  After careful review of history and examination, the risks and benefits of transesophageal echocardiogram have been explained including risks of esophageal damage, perforation (1:10,000 risk), bleeding, pharyngeal hematoma as well as other potential complications associated with conscious sedation including aspiration, arrhythmia, respiratory failure and death. Alternatives to treatment were discussed, questions were answered. Patient was unable to consent as she is very drowsy and appears to be obtunded.  I discussed the case with Dr. Leonie Man, who felt it is important to proceed with TEE.  I ended up calling her son and discussed benefit and risk of the procedure with him, he is agreeable for the patient to proceed.  Patient is a 75 year old female with past medical history of hypertension, DM2, hypothyroidism, history of CVA, recent Streptococcus G bacteremia status post IV antibiotic and end-stage renal disease.  She initially was admitted with encephalopathy, this was felt to be multifactorial.  MRI of the brain showed punctate acute infarcts of the right temporal lobe and bilateral thalamus and the left parietal lobe.  Cardiology service consulted for TEE.  Patient was obtunded during the interview and was only oriented to self only.  Vital signs stable, creatinine 5.59, potassium 3.4, hemoglobin 8.2, platelet 251.  Almyra Deforest, PA-C 05/14/2021 2:09 PM
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
## 2021-05-14 NOTE — H&P (View-Only) (Signed)
PROGRESS NOTE    Kirsten Mcmahon  ZYY:[id number] DOB: [birth year] DOA: 04/28/2021 PCP: [place]   Brief Narrative:  75 year old F with history of Streptococcal bacteremia on IV vanc, ESRD on HD MWF, NIDDM-2, CVA with left hemiparesis, HTN, HLD and cognitive deficit presenting with confusion and admitted for encephalopathy felt to be due to uremia in the setting of missed HD.  His BUN was 66 on admission.  However, encephalopathy did not improve after dialysis.  Family reports progressive confusion since her fall 2 months prior.  She was also on clonidine patch which has been discontinued since admission.  CT head without acute finding but old right MCA infarct.  She is on Keppra 500 mg daily with additional 250 mg after HD.  Basic encephalopathy work-up including VBG, ammonia, TSH, B12 and RPR unrevealing.  Palliative medicine consulted.  Still full code with full scope of care and HD.  Renal navigator checking for HD unit in the area that will allow a chairside sitter, so family can sit with patient during dialysis session.  Assessment & Plan:   Principal Problem:   ESRD (end stage renal disease) ([place]) Active Problems:   Hypertension   Hypothyroidism   Type II diabetes mellitus with renal manifestations ([place])   Streptococcal bacteremia   Pressure injury of skin   Physical deconditioning   Generalized muscle weakness   Cerebral embolism with cerebral infarction   Major cognitive impairment/possible acute metabolic encephalopathy-initially felt to be due to uremia from missed HD but no improvement with dialysis.  She could have vascular dementia. CTH without acute finding but chronic right MCA CVA.  Other encephalopathy work-up including VBG, TSH, ammonia, B12 and RPR unrevealing.  Patient was also on clonidine patch that was discontinued since admission.  She is also on Keppra which is renally dosed but could still cause some degree of encephalopathy but I highly doubt  it since she has been on it for very long time.  Due to waxing and waning encephalopathy, MRI was done late evening 05/12/2021 which shows bilateral multiple punctate infarcts (see below). Patient is slightly more alert today and oriented x2 as well. -Avoid sedating medications.   Acute ischemic stroke: MRI completed late evening of 05/12/2021 shows bilateral multiple punctate infarcts.  Neurology was consulted.  They are going to do further work-up with MR angiogram of head, Doppler carotid negative for any carotid stenosis and and EEG negative as well.  Neurology recommended TEE to rule out endocarditis.  I consulted cardiology yesterday and I was informed that she will get the TEE on 05/15/2021.   ESRD on HD MWF-Seems to be tolerating HD in a chair but requires sitter.   Bone mineral disorder -Renal navigator checking for HD unit in the area that will allow a chair side sitter.  -Palliative medicine following as well.  Dialysis per nephrology.   Streptococcus group G bacteremia-diagnosed prior hospitalization.  Family declined TTE at that time.  Plan was IV ampicillin until 06/01/2021 that seems to have changed to IV vancomycin with HD. -Continue IV vancomycin with HD   Anemia of chronic kidney disease: H&H stable.  -ESA and IV iron per nephrology   Essential hypertension: On metoprolol and clonidine patch at home.  Clonidine d/cd earlier.  Blood pressure now better with resumption of Toprol-XL.   Hypothyroidism: TSH within normal -Continue Synthroid 112 mcg daily   Controlled NIDDM-2 with ESRD: A1c 5.7%.  CBG within appropriate range.  CBG monitoring discontinued.   History of  seizures: Seems a stable. -Continue Keppra 500 mg daily and Keppra 250 mg three times weekly   History of right MCA CVA with residual left hemiparesis-   Pressure injury Mid buttocks, present on admission.   Goal of care counseling:   Poor long-term prognosis.  Seen by palliative care.  Still full code which I  believe would pose more harm than benefit.  Patient is followed by [facility].  Previous hospitalist had extensive discussion with patient's son, [name] over the phone.  They discussed about the pros and cons of CPR and intubation.  He asked physicians opinion and he recommended DNR and DNI but he struggled to make a decision, and wanted to continue full code.   Nutrition Body mass index is 22.34 kg/m. Nutrition Problem: Increased nutrient needs Etiology: chronic illness (ESRD on HD) Signs/Symptoms: estimated needs Interventions: Nepro shake,MVI,Prostat Pressure Injury 04/29/21 Buttocks Mid Stage 2 -  Partial thickness loss of dermis presenting as a shallow open injury with a red, pink wound bed without slough. (Active) 04/29/21 0045 Location: Buttocks Location Orientation: Mid Staging: Stage 2 -  Partial thickness loss of dermis presenting as a shallow open injury with a red, pink wound bed without slough. Wound Description (Comments):  Present on Admission: Yes  DVT prophylaxis: heparin injection 5,000 Units Start: 04/30/21 2200   Code Status: Full Code  Family Communication:  None present at bedside.  Stated patient'S son over the phone. Status is: Inpatient  Remains inpatient appropriate because:Unsafe d/c plan   Dispo: The patient is from: Home              Anticipated d/c is to: Home              Patient currently is not medically stable to d/c.   Difficult to place patient No        Estimated body mass index is 22.78 kg/m as calculated from the following:   Height as of this encounter: 5\' 2"  (1.575 m).   Weight as of this encounter: 56.5 kg.  Pressure Injury 04/29/21 Buttocks Mid Stage 2 -  Partial thickness loss of dermis presenting as a shallow open injury with a red, pink wound bed without slough. (Active)  04/29/21 0045  Location: Buttocks  Location Orientation: Mid  Staging: Stage 2 -  Partial thickness loss of dermis presenting as a shallow open injury  with a red, pink wound bed without slough.  Wound Description (Comments):   Present on Admission: Yes     Nutritional status:  Nutrition Problem: Increased nutrient needs Etiology: chronic illness (ESRD on HD)   Signs/Symptoms: estimated needs   Interventions: Nepro shake, MVI, Prostat    Consultants:  Nephrology Palliative care Neurology  Procedures:  None  Antimicrobials:  Anti-infectives (From admission, onward)    Start     Dose/Rate Route Frequency Ordered Stop   05/07/21 1200  vancomycin (VANCOREADY) IVPB 500 mg/100 mL        500 mg 100 mL/hr over 60 Minutes Intravenous  Once 05/07/21 0803 05/07/21 1229   05/06/21 1200  vancomycin (VANCOCIN) IVPB 500 mg/100 ml premix        500 mg 100 mL/hr over 60 Minutes Intravenous Every M-W-F (Hemodialysis) 04/30/21 0727 06/01/21 2359   05/04/21 1800  vancomycin (VANCOREADY) IVPB 500 mg/100 mL  Status:  Discontinued        500 mg 100 mL/hr over 60 Minutes Intravenous  Once 05/04/21 1524 05/07/21 0803   05/04/21 1609  vancomycin (VANCOCIN) 500-5  MG/100ML-% IVPB       Note to Pharmacy: [name]  : cabinet override      05/04/21 1609 05/04/21 1613   05/01/21 1200  vancomycin (VANCOCIN) IVPB 500 mg/100 ml premix  Status:  Discontinued       Note to Pharmacy: For this week only she is going to receive hd on 5/26 and 5/28 so please give the Vancomycin. FOr next week back to mon wed fri   500 mg 100 mL/hr over 60 Minutes Intravenous Every M-W-F (Hemodialysis) 04/30/21 0723 04/30/21 0726   04/30/21 1800  vancomycin (VANCOCIN) IVPB 500 mg/100 ml premix       Note to Pharmacy: For this week only she is going to receive hd on 5/26 and 5/28 so please give the Vancomycin. FOr next week back to mon wed fri   500 mg 100 mL/hr over 60 Minutes Intravenous Every T-Th-Sa (1800) 04/30/21 0727 05/02/21 1839   04/29/21 1539  vancomycin (VANCOCIN) 500-5 MG/100ML-% IVPB       Note to Pharmacy: [name]  : cabinet override       04/29/21 1539 04/29/21 1727   04/29/21 1200  vancomycin (VANCOREADY) IVPB 500 mg/100 mL  Status:  Discontinued        500 mg 100 mL/hr over 60 Minutes Intravenous Every M-W-F (Hemodialysis) 04/28/21 1812 04/30/21 1601          Subjective: Patient seen and examined this morning.  Doppler carotid was being performed.  Patient was alert and oriented x2.  She denied any complaints.  Objective: Vitals:   05/13/21 1740 05/13/21 1801 05/13/21 2050 05/14/21 0455  BP:  118/63 113/73 127/62  Pulse: (!) 113 (!) 107 96 95  Resp:  15 20 18   Temp: 97.9 F (36.6 C) 97.6 F (36.4 C) 98.1 F (36.7 C) 98 F (36.7 C)  TempSrc: Oral Oral Oral Oral  SpO2: 95% 97% 98% 97%  Weight: 56.5 kg 56.5 kg    Height:        Intake/Output Summary (Last 24 hours) at 05/14/2021 1318 Last data filed at 05/13/2021 1740 Gross per 24 hour  Intake 240 ml  Output -245 ml  Net 485 ml    Filed Weights   05/13/21 1400 05/13/21 1740 05/13/21 1801  Weight: 56.6 kg 56.5 kg 56.5 kg    Examination:  General exam: Appears calm and comfortable  Respiratory system: Clear to auscultation. Respiratory effort normal. Cardiovascular system: S1 & S2 heard, RRR. No JVD, murmurs, rubs, gallops or clicks. No pedal edema. Gastrointestinal system: Abdomen is nondistended, soft and nontender. No organomegaly or masses felt. Normal bowel sounds heard. Central nervous system: Alert and oriented x2, has dense hemiparesis on the left side.   Data Reviewed: I have personally reviewed following labs and imaging studies  CBC: Recent Labs  Lab 05/08/21 0445 05/13/21 1416  WBC 6.8 7.3  NEUTROABS 4.7  --   HGB 8.6* 8.2*  HCT 27.3* 26.5*  MCV 91.6 92.3  PLT 128* 093    Basic Metabolic Panel: Recent Labs  Lab 05/08/21 0445 05/13/21 1416  NA 136 136  K 3.7 3.4*  CL 99 97*  CO2 25 28  GLUCOSE 88 197*  BUN 19 30*  CREATININE 4.31* 5.59*  CALCIUM 8.8* 8.6*  MG 2.1  --   PHOS 3.2 3.6    GFR: Estimated  Creatinine Clearance: 6.8 mL/min (A) (by C-G formula based on SCr of 5.59 mg/dL (H)). Liver Function Tests: Recent Labs  Lab 05/08/21 0445  05/13/21 1416  AST 22  --   ALT 16  --   ALKPHOS 74  --   BILITOT 0.7  --   PROT 7.1  --   ALBUMIN 2.7* 2.6*    No results for input(s): LIPASE, AMYLASE in the last 168 hours. Recent Labs  Lab 05/07/21 1859  AMMONIA 26    Coagulation Profile: No results for input(s): INR, PROTIME in the last 168 hours. Cardiac Enzymes: No results for input(s): CKTOTAL, CKMB, CKMBINDEX, TROPONINI in the last 168 hours. BNP (last 3 results) No results for input(s): PROBNP in the last 8760 hours. HbA1C: Recent Labs    05/13/21 0429  HGBA1C 5.6   CBG: Recent Labs  Lab 05/10/21 0812 05/10/21 1201 05/10/21 1718 05/11/21 1955 05/12/21 0742  GLUCAP 95 107* 104* 144* 90    Lipid Profile: Recent Labs    05/13/21 0429  LDLDIRECT 88.5    Thyroid Function Tests: No results for input(s): TSH, T4TOTAL, FREET4, T3FREE, THYROIDAB in the last 72 hours. Anemia Panel: No results for input(s): VITAMINB12, FOLATE, FERRITIN, TIBC, IRON, RETICCTPCT in the last 72 hours. Sepsis Labs: No results for input(s): PROCALCITON, LATICACIDVEN in the last 168 hours.  No results found for this or any previous visit (from the past 240 hour(s)).    Radiology Studies: MR ANGIO HEAD WO CONTRAST  Result Date: 05/13/2021 CLINICAL DATA:  Neuro deficit, acute, stroke suspected. EXAM: MRA HEAD WITHOUT CONTRAST TECHNIQUE: Angiographic images of the Circle of Willis were acquired using MRA technique without intravenous contrast. COMPARISON:  MRI of the brain May 12, 2021. FINDINGS: The study is severely degraded by motion. Most images are essentially nondiagnostic. Anterior circulation: Correlated identified in the intracranial internal carotid arteries and proximal M1 and A1 segments. Posterior circulation: Flow related enhancement noted in the basilar artery and bilateral  vertebral arteries Other: None. IMPRESSION: 1. Severely degraded study due to motion artifact. Most images are essentially nondiagnostic. 2. Flow related enhancement seen in the intracranial internal carotid arteries, proximal bilateral M1/MCA segments and A1/ACA segments. 3. Flow related enhancement in the bilateral vertebral arteries and basilar artery. 4. Remainder of the vessels cannot be evaluated. Electronically Signed   By: Pedro Earls M.D.   On: 05/13/2021 12:48   MR BRAIN WO CONTRAST  Result Date: 05/12/2021 CLINICAL DATA:  Encephalopathy EXAM: MRI HEAD WITHOUT CONTRAST TECHNIQUE: Multiplanar, multiecho pulse sequences of the brain and surrounding structures were obtained without intravenous contrast. COMPARISON:  01/17/2021 FINDINGS: DWI, sagittal T1, and axial T2 sequences were attempted. There is significant motion artifact. Brain: Punctate foci of reduced diffusion are present in the right temporal lobe, right thalamus, left thalamus, and left parietal lobe. Large right MCA territory infarction. Wallerian degeneration along the right cerebral peduncle. Additional scattered smaller chronic infarcts. No significant mass effect. No hydrocephalus. Vascular: Major vessel flow voids at the skull base grossly preserved. Skull and upper cervical spine: Normal marrow signal is grossly preserved. Sinuses/Orbits: Paranasal sinuses are aerated. Orbits are grossly unremarkable. Other: None. IMPRESSION: Significantly motion degraded study. Patient could not tolerate all sequences. Few punctate acute infarcts of right temporal lobe, bilateral thalamus, and left parietal lobe. Electronically Signed   By: Macy Mis M.D.   On: 05/12/2021 18:51   EEG adult  Result Date: 05/13/2021 Lora Havens, MD     05/13/2021 11:58 AM Patient Name: NELTA CAUDILL MRN: [id number] Epilepsy Attending: Lora Havens Referring Physician/Provider: Dr Donnetta Simpers Date: 05/13/2021 Duration: 26.07 mins  Patient history: 75 year old female  with altered mental status.  EEG to evaluate for seizures. Level of alertness: Awake AEDs during EEG study: Keppra Technical aspects: This EEG study was done with scalp electrodes positioned according to the 10-20 International system of electrode placement. Electrical activity was acquired at a sampling rate of 500Hz  and reviewed with a high frequency filter of 70Hz  and a low frequency filter of 1Hz . EEG data were recorded continuously and digitally stored. Description:  The posterior dominant rhythm consists of 8 Hz activity of moderate voltage (25-35 uV) seen predominantly in posterior head regions, symmetric and reactive to eye opening and eye closing.  EEG showed continuous generalized 3 to 5 Hz theta-delta slowing.  There is also sharply contoured 3 to 5 Hz theta-delta slowing in right frontal region consistent with underlying breech artifact.  Sharp transients were seen right parietal region. Hyperventilation and photic stimulation were not performed.   ABNORMALITY -Continuous slow, generalized - Breach artifact, right frontal region IMPRESSION: This study is suggestive of cortical dysfunction in right frontal region consistent with prior craniotomy.  Additionally there is evidence of moderate diffuse encephalopathy, nonspecific etiology.  No seizures or definite epileptiform discharges were seen throughout the recording. [name]   VAS US CAROTID  Result Date: 05/14/2021 Carotid Arterial Duplex Study Patient Name:  DAMALI BROADFOOT  Date of Exam:   05/14/2021 Medical Rec #: [id number]          Accession #:    [id number] Date of Birth: [birth year]           Patient Gender: F Patient Age:   74Y Exam Location:  [facility] Procedure:      VAS US CAROTID Referring Phys: [id number] [place] --------------------------------------------------------------------------------  Indications:       CVA. Comparison Study:  No prior study Performing Technologist:  Maudry Mayhew MHA, RDMS, RVT, RDCS  Examination Guidelines: A complete evaluation includes B-mode imaging, spectral Doppler, color Doppler, and power Doppler as needed of all accessible portions of each vessel. Bilateral testing is considered an integral part of a complete examination. Limited examinations for reoccurring indications may be performed as noted.  Right Carotid Findings: +----------+--------+--------+--------+------------------+--------+           PSV cm/sEDV cm/sStenosisPlaque DescriptionComments +----------+--------+--------+--------+------------------+--------+ CCA Prox  51      17                                         +----------+--------+--------+--------+------------------+--------+ CCA Distal43      10                                         +----------+--------+--------+--------+------------------+--------+ ICA Prox  42      14                                         +----------+--------+--------+--------+------------------+--------+ ECA       36      8                                          +----------+--------+--------+--------+------------------+--------+ +---------+--------+--------+---------+ VertebralPSV cm/sEDV cm/sAntegrade +---------+--------+--------+---------+  Left Carotid Findings: +----------+--------+--------+--------+-----------------------+--------+  PSV cm/sEDV cm/sStenosisPlaque Description     Comments +----------+--------+--------+--------+-----------------------+--------+ CCA Prox  41      11                                              +----------+--------+--------+--------+-----------------------+--------+ CCA Distal36      11                                              +----------+--------+--------+--------+-----------------------+--------+ ICA Prox  33      17                                              +----------+--------+--------+--------+-----------------------+--------+ ICA  Distal44      12                                              +----------+--------+--------+--------+-----------------------+--------+ ECA       41      10              smooth and heterogenous         +----------+--------+--------+--------+-----------------------+--------+ +----------+--------+--------+----------------+-------------------+           PSV cm/sEDV cm/sDescribe        Arm Pressure (mmHG) +----------+--------+--------+----------------+-------------------+ [id number]              Multiphasic, WNL                    +----------+--------+--------+----------------+-------------------+ +---------+--------+--+--------+--+---------+ VertebralPSV cm/s52EDV cm/s18Antegrade +---------+--------+--+--------+--+---------+   Summary: Right Carotid: Velocities in the right ICA are consistent with a 1-39% stenosis. Left Carotid: Velocities in the left ICA are consistent with a 1-39% stenosis. Vertebrals:  Left vertebral artery demonstrates antegrade flow. Subclavians: Normal flow hemodynamics were seen in the left subclavian artery. *See table(s) above for measurements and observations.     Preliminary      Scheduled Meds:  (feeding supplement) PROSource Plus  30 mL Oral TID BM   aspirin  81 mg Oral Daily   atorvastatin  40 mg Oral Daily   Chlorhexidine Gluconate Cloth  6 each Topical Q0600   darbepoetin (ARANESP) injection - DIALYSIS  25 mcg Intravenous Q Mon-HD   feeding supplement (NEPRO CARB STEADY)  237 mL Oral BID BM   heparin  5,000 Units Subcutaneous Q8H   latanoprost  1 drop Both Eyes QHS   letrozole  2.5 mg Oral Daily   levETIRAcetam  500 mg Oral Q1500   And   levETIRAcetam  250 mg Oral Once per day on Mon Wed Fri   levothyroxine  112 mcg Oral QAC breakfast   metoprolol succinate  50 mg Oral Daily   multivitamin  1 tablet Oral QHS   vancomycin  500 mg Intravenous Q M,W,F-HD   Continuous Infusions:   LOS: 16 days   Time spent: 30 -minute   Darliss Cheney, MD [facility]  05/14/2021, 1:18 PM   How to contact the [facility] Attending or Consulting provider [place] or covering provider during after hours [place],  for this patient?  Check the care team in [facility] and look for a) attending/consulting [facility] provider listed and b) the [facility] team listed. Page or secure chat [phone]. Log into [email or web address] and use [place]'s universal password to access. If you do not have the password, please contact the hospital operator. Locate the [facility] provider you are looking for under [facility] and page to a number that you can be directly reached. If you still have difficulty reaching the provider, please page the [facility] (Director on Call) for the Hospitalists listed on amion for assistance.

## 2021-05-14 NOTE — Progress Notes (Signed)
PROGRESS NOTE    Kirsten Mcmahon  QTM:[id number] DOB: Oct 13, 1946 DOA: 04/28/2021 PCP: [place]   Brief Narrative:  75 year old F with history of Streptococcal bacteremia on IV vanc, ESRD on HD MWF, NIDDM-2, CVA with left hemiparesis, HTN, HLD and cognitive deficit presenting with confusion and admitted for encephalopathy felt to be due to uremia in the setting of missed HD.  His BUN was 66 on admission.  However, encephalopathy did not improve after dialysis.  Family reports progressive confusion since her fall 2 months prior.  She was also on clonidine patch which has been discontinued since admission.  CT head without acute finding but old right MCA infarct.  She is on Keppra 500 mg daily with additional 250 mg after HD.  Basic encephalopathy work-up including VBG, ammonia, TSH, B12 and RPR unrevealing.  Palliative medicine consulted.  Still full code with full scope of care and HD.  Renal navigator checking for HD unit in the area that will allow a chairside sitter, so family can sit with patient during dialysis session.  Assessment & Plan:   Principal Problem:   ESRD (end stage renal disease) ([place]) Active Problems:   Hypertension   Hypothyroidism   Type II diabetes mellitus with renal manifestations ([place])   Streptococcal bacteremia   Pressure injury of skin   Physical deconditioning   Generalized muscle weakness   Cerebral embolism with cerebral infarction   Major cognitive impairment/possible acute metabolic encephalopathy-initially felt to be due to uremia from missed HD but no improvement with dialysis.  She could have vascular dementia. CTH without acute finding but chronic right MCA CVA.  Other encephalopathy work-up including VBG, TSH, ammonia, B12 and RPR unrevealing.  Patient was also on clonidine patch that was discontinued since admission.  She is also on Keppra which is renally dosed but could still cause some degree of encephalopathy but I highly doubt  it since she has been on it for very long time.  Due to waxing and waning encephalopathy, MRI was done late evening 05/12/2021 which shows bilateral multiple punctate infarcts (see below). Patient is slightly more alert today and oriented x2 as well. -Avoid sedating medications.   Acute ischemic stroke: MRI completed late evening of 05/12/2021 shows bilateral multiple punctate infarcts.  Neurology was consulted.  They are going to do further work-up with MR angiogram of head, Doppler carotid negative for any carotid stenosis and and EEG negative as well.  Neurology recommended TEE to rule out endocarditis.  I consulted cardiology yesterday and I was informed that she will get the TEE on 05/15/2021.   ESRD on HD MWF-Seems to be tolerating HD in a chair but requires sitter.   Bone mineral disorder -Renal navigator checking for HD unit in the area that will allow a chair side sitter.  -Palliative medicine following as well.  Dialysis per nephrology.   Streptococcus group G bacteremia-diagnosed prior hospitalization.  Family declined TTE at that time.  Plan was IV ampicillin until 06/01/2021 that seems to have changed to IV vancomycin with HD. -Continue IV vancomycin with HD   Anemia of chronic kidney disease: H&H stable.  -ESA and IV iron per nephrology   Essential hypertension: On metoprolol and clonidine patch at home.  Clonidine d/cd earlier.  Blood pressure now better with resumption of Toprol-XL.   Hypothyroidism: TSH within normal -Continue Synthroid 112 mcg daily   Controlled NIDDM-2 with ESRD: A1c 5.7%.  CBG within appropriate range.  CBG monitoring discontinued.   History of  seizures: Seems a stable. -Continue Keppra 500 mg daily and Keppra 250 mg three times weekly   History of right MCA CVA with residual left hemiparesis-   Pressure injury Mid buttocks, present on admission.   Goal of care counseling:   Poor long-term prognosis.  Seen by palliative care.  Still full code which I  believe would pose more harm than benefit.  Patient is followed by [facility].  Previous hospitalist had extensive discussion with patient's son, [name] over the phone.  They discussed about the pros and cons of CPR and intubation.  He asked physicians opinion and he recommended DNR and DNI but he struggled to make a decision, and wanted to continue full code.   Nutrition Body mass index is 22.34 kg/m. Nutrition Problem: Increased nutrient needs Etiology: chronic illness (ESRD on HD) Signs/Symptoms: estimated needs Interventions: Nepro shake,MVI,Prostat Pressure Injury 04/29/21 Buttocks Mid Stage 2 -  Partial thickness loss of dermis presenting as a shallow open injury with a red, pink wound bed without slough. (Active) 04/29/21 0045 Location: Buttocks Location Orientation: Mid Staging: Stage 2 -  Partial thickness loss of dermis presenting as a shallow open injury with a red, pink wound bed without slough. Wound Description (Comments):  Present on Admission: Yes  DVT prophylaxis: heparin injection 5,000 Units Start: 04/30/21 2200   Code Status: Full Code  Family Communication:  None present at bedside.  Stated patient'S son over the phone. Status is: Inpatient  Remains inpatient appropriate because:Unsafe d/c plan   Dispo: The patient is from: Home              Anticipated d/c is to: Home              Patient currently is not medically stable to d/c.   Difficult to place patient No        Estimated body mass index is 22.78 kg/m as calculated from the following:   Height as of this encounter: 5\' 2"  (1.575 m).   Weight as of this encounter: 56.5 kg.  Pressure Injury 04/29/21 Buttocks Mid Stage 2 -  Partial thickness loss of dermis presenting as a shallow open injury with a red, pink wound bed without slough. (Active)  04/29/21 0045  Location: Buttocks  Location Orientation: Mid  Staging: Stage 2 -  Partial thickness loss of dermis presenting as a shallow open injury  with a red, pink wound bed without slough.  Wound Description (Comments):   Present on Admission: Yes     Nutritional status:  Nutrition Problem: Increased nutrient needs Etiology: chronic illness (ESRD on HD)   Signs/Symptoms: estimated needs   Interventions: Nepro shake, MVI, Prostat    Consultants:  Nephrology Palliative care Neurology  Procedures:  None  Antimicrobials:  Anti-infectives (From admission, onward)    Start     Dose/Rate Route Frequency Ordered Stop   05/07/21 1200  vancomycin (VANCOREADY) IVPB 500 mg/100 mL        500 mg 100 mL/hr over 60 Minutes Intravenous  Once 05/07/21 0803 05/07/21 1229   05/06/21 1200  vancomycin (VANCOCIN) IVPB 500 mg/100 ml premix        500 mg 100 mL/hr over 60 Minutes Intravenous Every M-W-F (Hemodialysis) 04/30/21 0727 06/01/21 2359   05/04/21 1800  vancomycin (VANCOREADY) IVPB 500 mg/100 mL  Status:  Discontinued        500 mg 100 mL/hr over 60 Minutes Intravenous  Once 05/04/21 1524 05/07/21 0803   05/04/21 1609  vancomycin (VANCOCIN) 500-5  MG/100ML-% IVPB       Note to Pharmacy: [name]  : cabinet override      05/04/21 1609 05/04/21 1613   05/01/21 1200  vancomycin (VANCOCIN) IVPB 500 mg/100 ml premix  Status:  Discontinued       Note to Pharmacy: For this week only she is going to receive hd on 5/26 and 5/28 so please give the Vancomycin. FOr next week back to mon wed fri   500 mg 100 mL/hr over 60 Minutes Intravenous Every M-W-F (Hemodialysis) 04/30/21 0723 04/30/21 0726   04/30/21 1800  vancomycin (VANCOCIN) IVPB 500 mg/100 ml premix       Note to Pharmacy: For this week only she is going to receive hd on 5/26 and 5/28 so please give the Vancomycin. FOr next week back to mon wed fri   500 mg 100 mL/hr over 60 Minutes Intravenous Every T-Th-Sa (1800) 04/30/21 0727 05/02/21 1839   04/29/21 1539  vancomycin (VANCOCIN) 500-5 MG/100ML-% IVPB       Note to Pharmacy: [name]  : cabinet override       04/29/21 1539 04/29/21 1727   04/29/21 1200  vancomycin (VANCOREADY) IVPB 500 mg/100 mL  Status:  Discontinued        500 mg 100 mL/hr over 60 Minutes Intravenous Every M-W-F (Hemodialysis) 04/28/21 1812 04/30/21 2536          Subjective: Patient seen and examined this morning.  Doppler carotid was being performed.  Patient was alert and oriented x2.  She denied any complaints.  Objective: Vitals:   05/13/21 1740 05/13/21 1801 05/13/21 2050 05/14/21 0455  BP:  118/63 113/73 127/62  Pulse: (!) 113 (!) 107 96 95  Resp:  15 20 18   Temp: 97.9 F (36.6 C) 97.6 F (36.4 C) 98.1 F (36.7 C) 98 F (36.7 C)  TempSrc: Oral Oral Oral Oral  SpO2: 95% 97% 98% 97%  Weight: 56.5 kg 56.5 kg    Height:        Intake/Output Summary (Last 24 hours) at 05/14/2021 1318 Last data filed at 05/13/2021 1740 Gross per 24 hour  Intake 240 ml  Output -245 ml  Net 485 ml    Filed Weights   05/13/21 1400 05/13/21 1740 05/13/21 1801  Weight: 56.6 kg 56.5 kg 56.5 kg    Examination:  General exam: Appears calm and comfortable  Respiratory system: Clear to auscultation. Respiratory effort normal. Cardiovascular system: S1 & S2 heard, RRR. No JVD, murmurs, rubs, gallops or clicks. No pedal edema. Gastrointestinal system: Abdomen is nondistended, soft and nontender. No organomegaly or masses felt. Normal bowel sounds heard. Central nervous system: Alert and oriented x2, has dense hemiparesis on the left side.   Data Reviewed: I have personally reviewed following labs and imaging studies  CBC: Recent Labs  Lab 05/08/21 0445 05/13/21 1416  WBC 6.8 7.3  NEUTROABS 4.7  --   HGB 8.6* 8.2*  HCT 27.3* 26.5*  MCV 91.6 92.3  PLT 128* 644    Basic Metabolic Panel: Recent Labs  Lab 05/08/21 0445 05/13/21 1416  NA 136 136  K 3.7 3.4*  CL 99 97*  CO2 25 28  GLUCOSE 88 197*  BUN 19 30*  CREATININE 4.31* 5.59*  CALCIUM 8.8* 8.6*  MG 2.1  --   PHOS 3.2 3.6    GFR: Estimated  Creatinine Clearance: 6.8 mL/min (A) (by C-G formula based on SCr of 5.59 mg/dL (H)). Liver Function Tests: Recent Labs  Lab 05/08/21 0445  05/13/21 1416  AST 22  --   ALT 16  --   ALKPHOS 74  --   BILITOT 0.7  --   PROT 7.1  --   ALBUMIN 2.7* 2.6*    No results for input(s): LIPASE, AMYLASE in the last 168 hours. Recent Labs  Lab 05/07/21 1859  AMMONIA 26    Coagulation Profile: No results for input(s): INR, PROTIME in the last 168 hours. Cardiac Enzymes: No results for input(s): CKTOTAL, CKMB, CKMBINDEX, TROPONINI in the last 168 hours. BNP (last 3 results) No results for input(s): PROBNP in the last 8760 hours. HbA1C: Recent Labs    05/13/21 0429  HGBA1C 5.6   CBG: Recent Labs  Lab 05/10/21 0812 05/10/21 1201 05/10/21 1718 05/11/21 1955 05/12/21 0742  GLUCAP 95 107* 104* 144* 90    Lipid Profile: Recent Labs    05/13/21 0429  LDLDIRECT 88.5    Thyroid Function Tests: No results for input(s): TSH, T4TOTAL, FREET4, T3FREE, THYROIDAB in the last 72 hours. Anemia Panel: No results for input(s): VITAMINB12, FOLATE, FERRITIN, TIBC, IRON, RETICCTPCT in the last 72 hours. Sepsis Labs: No results for input(s): PROCALCITON, LATICACIDVEN in the last 168 hours.  No results found for this or any previous visit (from the past 240 hour(s)).    Radiology Studies: MR ANGIO HEAD WO CONTRAST  Result Date: 05/13/2021 CLINICAL DATA:  Neuro deficit, acute, stroke suspected. EXAM: MRA HEAD WITHOUT CONTRAST TECHNIQUE: Angiographic images of the Circle of Willis were acquired using MRA technique without intravenous contrast. COMPARISON:  MRI of the brain May 12, 2021. FINDINGS: The study is severely degraded by motion. Most images are essentially nondiagnostic. Anterior circulation: Correlated identified in the intracranial internal carotid arteries and proximal M1 and A1 segments. Posterior circulation: Flow related enhancement noted in the basilar artery and bilateral  vertebral arteries Other: None. IMPRESSION: 1. Severely degraded study due to motion artifact. Most images are essentially nondiagnostic. 2. Flow related enhancement seen in the intracranial internal carotid arteries, proximal bilateral M1/MCA segments and A1/ACA segments. 3. Flow related enhancement in the bilateral vertebral arteries and basilar artery. 4. Remainder of the vessels cannot be evaluated. Electronically Signed   By: Pedro Earls M.D.   On: 05/13/2021 12:48   MR BRAIN WO CONTRAST  Result Date: 05/12/2021 CLINICAL DATA:  Encephalopathy EXAM: MRI HEAD WITHOUT CONTRAST TECHNIQUE: Multiplanar, multiecho pulse sequences of the brain and surrounding structures were obtained without intravenous contrast. COMPARISON:  01/17/2021 FINDINGS: DWI, sagittal T1, and axial T2 sequences were attempted. There is significant motion artifact. Brain: Punctate foci of reduced diffusion are present in the right temporal lobe, right thalamus, left thalamus, and left parietal lobe. Large right MCA territory infarction. Wallerian degeneration along the right cerebral peduncle. Additional scattered smaller chronic infarcts. No significant mass effect. No hydrocephalus. Vascular: Major vessel flow voids at the skull base grossly preserved. Skull and upper cervical spine: Normal marrow signal is grossly preserved. Sinuses/Orbits: Paranasal sinuses are aerated. Orbits are grossly unremarkable. Other: None. IMPRESSION: Significantly motion degraded study. Patient could not tolerate all sequences. Few punctate acute infarcts of right temporal lobe, bilateral thalamus, and left parietal lobe. Electronically Signed   By: Macy Mis M.D.   On: 05/12/2021 18:51   EEG adult  Result Date: 05/13/2021 Lora Havens, MD     05/13/2021 11:58 AM Patient Name: ARIYEL JEANGILLES MRN: [id number] Epilepsy Attending: Lora Havens Referring Physician/Provider: Dr Donnetta Simpers Date: 05/13/2021 Duration: 26.07 mins  Patient history: 75 year old female  with altered mental status.  EEG to evaluate for seizures. Level of alertness: Awake AEDs during EEG study: Keppra Technical aspects: This EEG study was done with scalp electrodes positioned according to the 10-20 International system of electrode placement. Electrical activity was acquired at a sampling rate of 500Hz  and reviewed with a high frequency filter of 70Hz  and a low frequency filter of 1Hz . EEG data were recorded continuously and digitally stored. Description:  The posterior dominant rhythm consists of 8 Hz activity of moderate voltage (25-35 uV) seen predominantly in posterior head regions, symmetric and reactive to eye opening and eye closing.  EEG showed continuous generalized 3 to 5 Hz theta-delta slowing.  There is also sharply contoured 3 to 5 Hz theta-delta slowing in right frontal region consistent with underlying breech artifact.  Sharp transients were seen right parietal region. Hyperventilation and photic stimulation were not performed.   ABNORMALITY -Continuous slow, generalized - Breach artifact, right frontal region IMPRESSION: This study is suggestive of cortical dysfunction in right frontal region consistent with prior craniotomy.  Additionally there is evidence of moderate diffuse encephalopathy, nonspecific etiology.  No seizures or definite epileptiform discharges were seen throughout the recording. [name]   VAS US CAROTID  Result Date: 05/14/2021 Carotid Arterial Duplex Study Patient Name:  POET HINEMAN  Date of Exam:   05/14/2021 Medical Rec #: [id number]          Accession #:    [id number] Date of Birth: 12/08/1945           Patient Gender: F Patient Age:   74Y Exam Location[facility]  [facility] Procedure:      VAS US CAROTID Referring Phys: [id number] Marty --------------------------------------------------------------------------------  Indications:       CVA. Comparison Study:  No prior study Performing Technologist:  Maudry Mayhew MHA, RDMS, RVT, RDCS  Examination Guidelines: A complete evaluation includes B-mode imaging, spectral Doppler, color Doppler, and power Doppler as needed of all accessible portions of each vessel. Bilateral testing is considered an integral part of a complete examination. Limited examinations for reoccurring indications may be performed as noted.  Right Carotid Findings: +----------+--------+--------+--------+------------------+--------+           PSV cm/sEDV cm/sStenosisPlaque DescriptionComments +----------+--------+--------+--------+------------------+--------+ CCA Prox  51      17                                         +----------+--------+--------+--------+------------------+--------+ CCA Distal43      10                                         +----------+--------+--------+--------+------------------+--------+ ICA Prox  42      14                                         +----------+--------+--------+--------+------------------+--------+ ECA       36      8                                          +----------+--------+--------+--------+------------------+--------+ +---------+--------+--------+---------+ VertebralPSV cm/sEDV cm/sAntegrade +---------+--------+--------+---------+  Left Carotid Findings: +----------+--------+--------+--------+-----------------------+--------+  PSV cm/sEDV cm/sStenosisPlaque Description     Comments +----------+--------+--------+--------+-----------------------+--------+ CCA Prox  41      11                                              +----------+--------+--------+--------+-----------------------+--------+ CCA Distal36      11                                              +----------+--------+--------+--------+-----------------------+--------+ ICA Prox  33      17                                              +----------+--------+--------+--------+-----------------------+--------+ ICA  Distal44      12                                              +----------+--------+--------+--------+-----------------------+--------+ ECA       41      10              smooth and heterogenous         +----------+--------+--------+--------+-----------------------+--------+ +----------+--------+--------+----------------+-------------------+           PSV cm/sEDV cm/sDescribe        Arm Pressure (mmHG) +----------+--------+--------+----------------+-------------------+ [id number]              Multiphasic, WNL                    +----------+--------+--------+----------------+-------------------+ +---------+--------+--+--------+--+---------+ VertebralPSV cm/s52EDV cm/s18Antegrade +---------+--------+--+--------+--+---------+   Summary: Right Carotid: Velocities in the right ICA are consistent with a 1-39% stenosis. Left Carotid: Velocities in the left ICA are consistent with a 1-39% stenosis. Vertebrals:  Left vertebral artery demonstrates antegrade flow. Subclavians: Normal flow hemodynamics were seen in the left subclavian artery. *See table(s) above for measurements and observations.     Preliminary      Scheduled Meds:  (feeding supplement) PROSource Plus  30 mL Oral TID BM   aspirin  81 mg Oral Daily   atorvastatin  40 mg Oral Daily   Chlorhexidine Gluconate Cloth  6 each Topical Q0600   darbepoetin (ARANESP) injection - DIALYSIS  25 mcg Intravenous Q Mon-HD   feeding supplement (NEPRO CARB STEADY)  237 mL Oral BID BM   heparin  5,000 Units Subcutaneous Q8H   latanoprost  1 drop Both Eyes QHS   letrozole  2.5 mg Oral Daily   levETIRAcetam  500 mg Oral Q1500   And   levETIRAcetam  250 mg Oral Once per day on Mon Wed Fri   levothyroxine  112 mcg Oral QAC breakfast   metoprolol succinate  50 mg Oral Daily   multivitamin  1 tablet Oral QHS   vancomycin  500 mg Intravenous Q M,W,F-HD   Continuous Infusions:   LOS: 16 days   Time spent: 30 -minute   Darliss Cheney, MD [facility]  05/14/2021, 1:18 PM   How to contact the [facility] Attending or Consulting provider [place] or covering provider during after hours [place],  for this patient?  Check the care team in [facility] and look for a) attending/consulting [facility] provider listed and b) the [facility] team listed. Page or secure chat [phone]. Log into [email or web address] and use [place]'s universal password to access. If you do not have the password, please contact the hospital operator. Locate the [facility] provider you are looking for under [facility] and page to a number that you can be directly reached. If you still have difficulty reaching the provider, please page the [facility] (Director on Call) for the Hospitalists listed on amion for assistance.

## 2021-05-14 NOTE — Progress Notes (Signed)
Occupational Therapy Treatment Patient Details Name: Kirsten Mcmahon MRN: [id number] DOB: 1946-10-29 Today's Date: 05/14/2021    History of present illness 75yo female admitted 04/28/21 from an HD center after refusing dialysis. Also thought to have acute metabolic encepthalopathy. PMH breast CA, CVA with L residual hemiplegia, seizures, HTN, R fronto-temporal craniotomy   OT comments  Pt seen this morning for trial of left palm guard. Pt up in recliner upon arrival of OT. Pt noted to be lethargic this morning, increased tone and c/o pain in left elbow/forearm/shoulder. Plan to recheck splint and left hand in a couple of hours.    Follow Up Recommendations  Supervision/Assistance - 24 hour;SNF    Equipment Recommendations  3 in 1 bedside commode;Hospital bed    Recommendations for Other Services      Precautions / Restrictions Precautions Precautions: Fall;Other (comment) Precaution Comments: R perm-cath; history of R fronto-temporal craniectomy with musculocutaneous flap over site. L trunk and extremity tone Restrictions Weight Bearing Restrictions: No       Mobility Bed Mobility     Transfers                     Balance                            ADL either performed or assessed with clinical judgement   ADL                                               Vision       Perception     Praxis      Cognition Arousal/Alertness: Lethargic Behavior During Therapy: Flat affect Overall Cognitive Status: No family/caregiver present to determine baseline cognitive functioning                                 General Comments: lethargic        Exercises Other Exercises Other Exercises: PROM wrist and digits. Increased tone in L thumb. Pt unable to tolerate PROM elbow and shoulder this session 2/2 pain.   Shoulder Instructions       General Comments      Pertinent Vitals/ Pain       Pain Assessment:  Faces Faces Pain Scale: Hurts even more Pain Location: LUE Pain Descriptors / Indicators: Grimacing Pain Intervention(s): Monitored during session;Limited activity within patient's tolerance;Repositioned;Heat applied;Other (comment) (spoke with nurse regarding pain med)  Home Living                                          Prior Functioning/Environment              Frequency  Min 2X/week        Progress Toward Goals  OT Goals(current goals can now be found in the care plan section)  Progress towards OT goals: Progressing toward goals  Acute Rehab OT Goals Patient Stated Goal: none stated OT Goal Formulation: Patient unable to participate in goal setting Time For Goal Achievement: 05/26/21 Potential to Achieve Goals: Fair ADL Goals Pt Will Perform Eating: with max assist;sitting;bed level Pt Will Perform Grooming: with max assist;sitting;bed level Pt Will Transfer  to Toilet: with min assist;stand pivot transfer;bedside commode Additional ADL Goal #1: Caregiver will be independent with L palm guard splint management and wearing schedule.  Plan Discharge plan remains appropriate;Frequency remains appropriate    Co-evaluation                 AM-PAC OT "6 Clicks" Daily Activity     Outcome Measure   Help from another person eating meals?: Total Help from another person taking care of personal grooming?: Total Help from another person toileting, which includes using toliet, bedpan, or urinal?: Total Help from another person bathing (including washing, rinsing, drying)?: Total Help from another person to put on and taking off regular upper body clothing?: Total Help from another person to put on and taking off regular lower body clothing?: Total 6 Click Score: 6    End of Session Equipment Utilized During Treatment: Other (comment) (L palm guard)  OT Visit Diagnosis: Other abnormalities of gait and mobility (R26.89);Other symptoms and signs  involving cognitive function   Activity Tolerance Patient limited by lethargy;Patient limited by pain   Patient Left in chair;with call bell/phone within reach   Nurse Communication Other (comment) (LUE pain)        Time: 5449-2010 OT Time Calculation (min): 14 min  Charges: OT General Charges $OT Visit: 1 Visit OT Treatments $Therapeutic Activity: 8-22 mins  Tyrone Schimke, OT Acute Rehabilitation Services Pager: [phone] Office: [phone] Pt    Hortencia Pilar 05/14/2021, 12:59 PM

## 2021-05-14 NOTE — Progress Notes (Signed)
Physical Therapy Treatment Patient Details Name: Kirsten Mcmahon MRN: [id number] DOB: Sep 08, 1946 Today's Date: 05/14/2021    History of Present Illness 75yo female admitted 04/28/21 from an HD center after refusing dialysis. Also thought to have acute metabolic encepthalopathy. PMH breast CA, CVA with L residual hemiplegia, seizures, HTN, R fronto-temporal craniotomy    PT Comments    Patient received in bed, pleasant and agreeable to attempting PT today. Still having quite a bit of pain with mobility, but able to reduce this by having her actively look left and incrementally raising bed into full chair position (5-10 degrees at a time) before pivoting to EOB. Able to tolerate sitting at EOB with varying levels of assist from about 7-10 minutes- at times able to hold herself up with min guard, at other times needed ModA. Definitely has quite a bit of tone in L UE/LE and especially in L traps which may be contributing to pain and reluctance to try moving. Able to reduce this pain significantly with positioning with supportive bolsters in bed with c-spine in a much more neutral position. Would still very much benefit from professional custom [facility] consult for potential TIS WC with contoured neck/back support.  Left in bed with all needs met, bed alarm active. Did much better today- will continue to follow.    Follow Up Recommendations  Other (comment);Supervision/Assistance - 24 hour (PACE participant; would benefit from in home PACE services if possible)     Equipment Recommendations  Hospital bed;Wheelchair (measurements PT);Wheelchair cushion (measurements PT);Other (comment) (hoyer lift and pads)    Recommendations for Other Services Other (comment) (custom TIS WC and contoured back/neck rest consult)     Precautions / Restrictions Precautions Precautions: Fall;Other (comment) Precaution Comments: R perm-cath; history of R fronto-temporal craniectomy with musculocutaneous flap over site. L  trunk and extremity tone Restrictions Weight Bearing Restrictions: No    Mobility  Bed Mobility Overal bed mobility: Needs Assistance Bed Mobility: Supine to Sit;Sit to Supine     Supine to sit: Total assist;+2 for physical assistance Sit to supine: Total assist;+2 for physical assistance   General bed mobility comments: totalAx2 to get to/from EOB. Somewhat limited by pain when attempting mobility, able to reduce at least somewhat by having her actively look left and putting bed all the way in chair position 5-10 degrees at a time befor pivoting to EOB    Transfers                 General transfer comment: unable/fatigue  Ambulation/Gait             General Gait Details: unable   Stairs             Wheelchair Mobility    Modified Rankin (Stroke Patients Only)       Balance Overall balance assessment: Needs assistance Sitting-balance support: Single extremity supported;Feet supported Sitting balance-[name] Scale: Poor Sitting balance - Comments: varied from min guard to ModA Postural control: Right lateral lean;Posterior lean     Standing balance comment: unable to assess                            Cognition Arousal/Alertness: Awake/alert Behavior During Therapy: Flat affect Overall Cognitive Status: No family/caregiver present to determine baseline cognitive functioning                                 General  Comments: pleasant and cooperative, slow processing and decreased problem solving but agreeable to trying new things with therapy today. Able to respond appropriately maybe 60-70% of the time with delayed responses      Exercises      General Comments        Pertinent Vitals/Pain Pain Assessment: Faces Faces Pain Scale: Hurts little more Pain Location: generalized, with movement Pain Descriptors / Indicators: Grimacing Pain Intervention(s): Limited activity within patient's tolerance;Monitored during  session    Home Living                      Prior Function            PT Goals (current goals can now be found in the care plan section) Acute Rehab PT Goals Patient Stated Goal: none stated PT Goal Formulation: Patient unable to participate in goal setting Time For Goal Achievement: 05/28/21 Potential to Achieve Goals: Fair Progress towards PT goals: Goals downgraded-see care plan    Frequency    Min 2X/week      PT Plan Current plan remains appropriate    Co-evaluation              AM-PAC PT "6 Clicks" Mobility   Outcome Measure  Help needed turning from your back to your side while in a flat bed without using bedrails?: Total Help needed moving from lying on your back to sitting on the side of a flat bed without using bedrails?: Total Help needed moving to and from a bed to a chair (including a wheelchair)?: Total Help needed standing up from a chair using your arms (e.g., wheelchair or bedside chair)?: Total Help needed to walk in hospital room?: Total Help needed climbing 3-5 steps with a railing? : Total 6 Click Score: 6    End of Session   Activity Tolerance: Patient tolerated treatment well Patient left: in bed;with call bell/phone within reach;with bed alarm set;Other (comment) (towel bolster placed on right side of neck to promote neutral cervical alignment) Nurse Communication: Mobility status PT Visit Diagnosis: Muscle weakness (generalized) (M62.81);Difficulty in walking, not elsewhere classified (R26.2);Hemiplegia and hemiparesis;Other abnormalities of gait and mobility (R26.89) Hemiplegia - Right/Left: Left Hemiplegia - dominant/non-dominant: Non-dominant Hemiplegia - caused by: Cerebral infarction     Time: 1204-1233 PT Time Calculation (min) (ACUTE ONLY): 29 min  Charges:  $Therapeutic Activity: 8-22 mins (co-tx with OT)                    Windell Norfolk, DPT, PN1   Supplemental Physical Therapist [place]    Pager  [phone] Acute Rehab Office [phone]

## 2021-05-15 ENCOUNTER — Inpatient Hospital Stay (HOSPITAL_COMMUNITY): Payer: Medicare (Managed Care) | Admitting: Certified Registered"

## 2021-05-15 ENCOUNTER — Encounter (HOSPITAL_COMMUNITY)
Admission: EM | Disposition: A | Discharge status: Home / Self Care | Payer: Self-pay | Source: Home / Self Care | Attending: Family Medicine

## 2021-05-15 ENCOUNTER — Inpatient Hospital Stay (HOSPITAL_COMMUNITY): Payer: Medicare (Managed Care)

## 2021-05-15 ENCOUNTER — Encounter (HOSPITAL_COMMUNITY): Payer: Self-pay | Admitting: Cardiology

## 2021-05-15 DIAGNOSIS — I351 Nonrheumatic aortic (valve) insufficiency: Secondary | ICD-10-CM

## 2021-05-15 DIAGNOSIS — Q2112 Patent foramen ovale: Secondary | ICD-10-CM

## 2021-05-15 DIAGNOSIS — Q211 Atrial septal defect: Secondary | ICD-10-CM

## 2021-05-15 HISTORY — DX: Patent foramen ovale: Q21.12

## 2021-05-15 HISTORY — PX: BUBBLE STUDY: SHX6837

## 2021-05-15 HISTORY — PX: TEE WITHOUT CARDIOVERSION: SHX5443

## 2021-05-15 LAB — POCT I-STAT, CHEM 8
BUN: 27 mg/dL — ABNORMAL HIGH (ref 8–23)
Calcium, Ion: 1.13 mmol/L — ABNORMAL LOW (ref 1.15–1.40)
Chloride: 101 mmol/L (ref 98–111)
Creatinine, Ser: 5.4 mg/dL — ABNORMAL HIGH (ref 0.44–1.00)
Glucose, Bld: 100 mg/dL — ABNORMAL HIGH (ref 70–99)
HCT: 22 % — ABNORMAL LOW (ref 36.0–46.0)
Hemoglobin: 7.5 g/dL — ABNORMAL LOW (ref 12.0–15.0)
Potassium: 4.1 mmol/L (ref 3.5–5.1)
Sodium: 137 mmol/L (ref 135–145)
TCO2: 25 mmol/L (ref 22–32)

## 2021-05-15 LAB — VANCOMYCIN, RANDOM: Vancomycin Rm: 21

## 2021-05-15 SURGERY — ECHOCARDIOGRAM, TRANSESOPHAGEAL
Anesthesia: Monitor Anesthesia Care

## 2021-05-15 MED ORDER — MIDODRINE HCL 5 MG PO TABS: 5.0000 mg | ORAL_TABLET | ORAL | Status: DC

## 2021-05-15 MED ORDER — ALTEPLASE 2 MG IJ SOLR
INTRAMUSCULAR | Status: AC
  Administered 2021-05-15: 2 mg
  Filled 2021-05-15: qty 4

## 2021-05-15 MED ORDER — LIDOCAINE 2% (20 MG/ML) 5 ML SYRINGE
INTRAMUSCULAR | Status: DC | PRN
  Administered 2021-05-15: 50 mg via INTRAVENOUS

## 2021-05-15 MED ORDER — ALBUMIN HUMAN 25 % IV SOLN: 25.0000 g | Freq: Once | INTRAVENOUS | Status: AC | PRN

## 2021-05-15 MED ORDER — METOPROLOL TARTRATE 5 MG/5ML IV SOLN
5.0000 mg | Freq: Once | INTRAVENOUS | Status: DC
  Filled 2021-05-15: qty 5

## 2021-05-15 MED ORDER — SODIUM CHLORIDE 0.9 % IV SOLN: INTRAVENOUS | Status: DC

## 2021-05-15 MED ORDER — PHENYLEPHRINE 40 MCG/ML (10ML) SYRINGE FOR IV PUSH (FOR BLOOD PRESSURE SUPPORT)
PREFILLED_SYRINGE | INTRAVENOUS | Status: DC | PRN
  Administered 2021-05-15: 200 ug via INTRAVENOUS

## 2021-05-15 MED ORDER — HEPARIN SODIUM (PORCINE) 1000 UNIT/ML IJ SOLN
INTRAMUSCULAR | Status: DC | PRN
  Administered 2021-05-15: 1900 [IU] via INTRAVENOUS

## 2021-05-15 MED ORDER — ALTEPLASE 2 MG IJ SOLR: 2.0000 mg | Freq: Once | INTRAMUSCULAR | Status: AC

## 2021-05-15 MED ORDER — PROPOFOL 500 MG/50ML IV EMUL
INTRAVENOUS | Status: DC | PRN
  Administered 2021-05-15: 150 ug/kg/min via INTRAVENOUS

## 2021-05-15 MED ORDER — PROPOFOL 10 MG/ML IV BOLUS
INTRAVENOUS | Status: DC | PRN
  Administered 2021-05-15 (×2): 15 mg via INTRAVENOUS

## 2021-05-15 NOTE — Anesthesia Postprocedure Evaluation (Signed)
Anesthesia Post Note  Patient: NABRIA NEVIN  Procedure(s) Performed: TRANSESOPHAGEAL ECHOCARDIOGRAM (TEE) BUBBLE STUDY     Patient location during evaluation: Endoscopy Anesthesia Type: MAC Level of consciousness: awake and alert Pain management: pain level controlled Vital Signs Assessment: post-procedure vital signs reviewed and stable Respiratory status: spontaneous breathing, nonlabored ventilation and respiratory function stable Cardiovascular status: stable and blood pressure returned to baseline Postop Assessment: no apparent nausea or vomiting Anesthetic complications: no   No notable events documented.  Last Vitals:  Vitals:   05/15/21 1159 05/15/21 1205  BP: (!) 133/94 (!) 143/90  Pulse: (!) 107   Resp: 17 (!) 9  Temp: (!) 36.4 C   SpO2: 97%     Last Pain:  Vitals:   05/15/21 1205  TempSrc:   PainSc: 0-No pain                 Ariaunna Longsworth,W. EDMOND

## 2021-05-15 NOTE — Transfer of Care (Signed)
Immediate Anesthesia Transfer of Care Note  Patient: Kirsten Mcmahon  Procedure(s) Performed: TRANSESOPHAGEAL ECHOCARDIOGRAM (TEE) BUBBLE STUDY  Patient Location: Endoscopy Unit  Anesthesia Type:MAC  Level of Consciousness: drowsy  Airway & Oxygen Therapy: Patient Spontanous Breathing  Post-op Assessment: Report given to RN and Post -op Vital signs reviewed and stable  Post vital signs: Reviewed and stable  Last Vitals:  Vitals Value Taken Time  BP    Temp    Pulse    Resp    SpO2      Last Pain:  Vitals:   05/15/21 1028  TempSrc: Oral  PainSc: 0-No pain      Patients Stated Pain Goal: 0 (91/69/45 0388)  Complications: No notable events documented.

## 2021-05-15 NOTE — Progress Notes (Signed)
Pharmacy Antibiotic Note  Kirsten Mcmahon is a 75 y.o. female admitted on 04/28/2021 with group B Strep bacteremia.  Pharmacy has been consulted for vancomycin dosing in patient with ESRD - HD MWF. Plan for vancomycin therapy to continue until likely 6/27.  Random vancomycin level this AM 21 (therapeutic)  Plan:  Continue Vancomycin 500 mg IV after HD on HD days  Continue to follow   Height: 5\' 2"  (157.5 cm) Weight: 56.5 kg (124 lb 9 oz) IBW/kg (Calculated) : 50.1  Temp (24hrs), Avg:98.7 F (37.1 C), Min:98.2 F (36.8 C), Max:99.5 F (37.5 C)  Recent Labs  Lab 05/13/21 1416 05/14/21 0214 05/15/21 0130  WBC 7.3  --   --   CREATININE 5.59*  --   --   VANCORANDOM  --  24 21     Estimated Creatinine Clearance: 6.8 mL/min (A) (by C-G formula based on SCr of 5.59 mg/dL (H)).    Allergies  Allergen Reactions   Contrast Media [Iodinated Diagnostic Agents]     Other reaction(s): NO ALLERGY   Latex     Other reaction(s): NO ALLERGY   Shellfish-Derived Products     Other reaction(s): NO ALLERGY   Levemir [Insulin Detemir] Itching    Thank you Anette Guarneri, PharmD 05/15/2021 8:30 AM

## 2021-05-15 NOTE — Care Management Important Message (Signed)
Important Message  Patient Details  Name: Kirsten Mcmahon MRN: [id number] Date of Birth: [birth year]   Medicare Important Message Given:  Yes - Important Message mailed due to current National Emergency   Verbal consent obtained due to current National Emergency  Relationship to patient: Self Contact Name: Ardyth Call Date: 05/15/21  Time: 1051 Phone: [phone] Outcome: No Answer/Busy Important Message mailed to: Patient address on file    Delorse Lek 05/15/2021, 10:51 AM

## 2021-05-15 NOTE — Progress Notes (Signed)
[facility] BRIEF PROGRESS NOTE  [id number] 11-09-1946 Kirsten Mcmahon  Dialysis nurse called and informed myself patient catheter is malfunctioning.  Also noted patient "went out" prior to initiating dialysis while she was attempting to access [facility].  Placed on non re breather with improvement noted. TEE completed prior to HD today.  Discussed with Dr. Carolin Sicks. Plan to tpa catheter and return patient to room on the floor.  Labs are stable.  Will change patient dialysis orders to 1st shift tomorrow.   Jen Mow, PA-C [employer]

## 2021-05-15 NOTE — Progress Notes (Signed)
STROKE TEAM PROGRESS NOTE    Interval History  No acute events overnight. She remainse alert and interactive and less confused.  She is oriented to place and person into her condition TEE scheduled for today.  .  Carotid ultrasound shows bilateral 1-39% carotid stenosis.   Pertinent Lab Work and Imaging    05/07/21 CT Head WO IV Contrast 1. No acute intracranial abnormality. 2. Old right MCA territory infarct and findings of chronic microvascular ischemia.  05/12/21 MRI Brain WO IV Contrast Significantly motion degraded study. Patient could not tolerate all Sequences. Few punctate acute infarcts of right temporal lobe, bilateral thalamus, and left parietal lobe.  05/13/21 MR Angio Head  1. Severely degraded study due to motion artifact. Most images are essentially nondiagnostic. 2. Flow related enhancement seen in the intracranial internal carotid arteries, proximal bilateral M1/MCA segments and A1/ACA segments. 3. Flow related enhancement in the bilateral vertebral arteries and basilar artery. 4. Remainder of the vessels cannot be evaluated.  05/13/21 VAS US Carotid  Bilateral 1-39% stenosis  05/13/21 Echocardiogram Complete   1. Left ventricular ejection fraction, by estimation, is 55 to 60%. The left ventricle has normal function. The left ventricle has no regional wall motion abnormalities. There is mild left ventricular hypertrophy. Left ventricular diastolic parameters are consistent with Grade I diastolic dysfunction (impaired relaxation).   2. Right ventricular systolic function is normal. The right ventricular size is normal. Tricuspid regurgitation signal is inadequate for assessing  PA pressure.   3. Left atrial size was mildly dilated.   4. The mitral valve is normal in structure. No evidence of mitral valve regurgitation. No evidence of mitral stenosis.   5. The aortic valve is normal in structure. Aortic valve regurgitation is  trivial. No aortic stenosis is present.   6.  The inferior vena cava is normal in size with greater than 50%  respiratory variability, suggesting right atrial pressure of 3 mmHg.    Physical Examination   Constitutional: Frail African American woman laying in bed  Cardiovascular: Normal RR Respiratory: No increased WOB   Mental status: Oriented to self, year, place and person.  Follows command to show two fingers and thumb.  Speech: Fluent speech with hesitancy and perseveration's Cranial nerves: EOMI, VFF, Right facial droop, Tongue midline   Motor: 3/5 RUE. LUE + BLE 0/5 and notably spastic.  Trace withdrawal in right lower extremity to painful stimuli. Coordination: FNF intact with right hand  Gait: Deferred   Assessment and Plan   Kirsten Mcmahon is a 75 y.o. female w/pmh of GERD, Glaucoma, HTN, Hypothyroidism, DM2, overactive bladder, hx of prior R insular and frontal operculum infarct with encephalomalacia and gliosis with residual left sided weakness, old cerebellar infarcts, malignant R breast cancer, recent streptococcus G bacteremia prior to hospitalization and on IV Vancomycin who presents with encephalopathy, found to have punctate acute strokes via encephalopathy work up for which neurology was consulted.   #Right temporal lobe + Bilateral Thalamus + L Parietal Lobe Stroke-given strokes in multiple vascular territories possibly of cardioembolic stroke and recent history of sepsis possibility of endocarditis. #History of R MCA Stroke   Her MRI Brain revealed small punctuate strokes in multiple territories. Echocardiogram w/normal EF 55-60 %, LA mildly dilated and no atrial level shunt detected by color flow Doppler. Stroke labs w/LDL 88.5 A1C pending. Vessel imaging done with MRA Head + CUS; MRA Head images were severely degraded due to motion with most images non diagnostic. CUS is pending. At this time  her stroke etiology is cryptogenic, suspect cardioembolic given LA is mildly dilated. Spoke to patients son regarding  her prior stroke work up- she has not had cardiac monitoring in the past.  - Cardiac event monitor at discharge to evaluate for atrial fibrillation if she can tolerate this. ( if encephalopathy continues unsure if she will tolerate)  - Start Aspirin 81 mg and Atorvastatin 40 mg for secondary stroke prevention  -At discharge please place ambulatory referral to neurology for stroke follow up   #Encephalopathy  Initially this admission encephalopathy was assumed to be due to missed dialysis however there was no subsequent improvement with dialysis. Unclear etiology, likely multifactorial given her significant history of prior large R MCA stroke, ESRD and significant pain and prolonged hospitalization. Streptococcal bacteremia being addressed by primary team. She does not have a white count and has not fevered recently. Acute strokes are too small to be contributing to encephalopathy.  -    #Hypertension She has a history of HTN and takes at home. Currently blood pressure is trending normotensive. No need for permissive HTN at this time, maintain normotensive blood pressures.   #Hyperlipidemia From a stroke prevention stand point, the LDL goal is < 70. LLD 88.5 thus Atorvastatin 40 mg was initiated.   #History of DMII  She has a history of diabetes, A1C pending   Hospital day # 17   Patient had recent sepsis and infection and MRI showed multiple strokes in different vascular distribution hence the possibility of endocarditis but TEE shows no evidence of vegetations or endocarditis.  A PFO is noted hence recommend checking lower extremity venous Dopplers for DVT.  The patient is clinical degree of encephalopathy seems out of proportion to the small punctate stroke that she has but appears to be improving daily.  Stroke team will sign off.  Kindly call for questions.  Discussed with patient and Dr. Einar Grad.  Greater than 50% time during this 25-minute visit was spent on counseling and coordination of care  about her strokes and discussion with care team.  Antony Contras, MD Medical Director [place] Pager: [phone] 05/15/2021 1:30 PM   To contact Stroke Continuity provider, please refer to [email or web address] After hours, contact General Neurology
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
## 2021-05-15 NOTE — Significant Event (Signed)
Rapid Response Event Note   Reason for Call : Unresponsive   Initial Focused Assessment: Per HD nurse, patient became unresponsive for a "few seconds" prior to initiating HD. NRB placed on pt by HD RN. On arrival, patient was alert and oriented x2 (baseline). Lungs were clear throughout and on NRB. Skin was warm and dry. Palpable pulses bilaterally. Pupils were a 3, equal, round, and reactive. Follows commands and left side weakness noted from from previous stroke.  VS: BP 127/75, HR 96, O2 100% on NRB, RR 20  Interventions:  -MD made aware.  Plan of Care:  -Per MD, okay to continue with dialysis -RRRN recommended slowly weaning down oxygen requirements as patient tolerates HD during session.   Please notify RRRN if patient has any additional changes in status.  Event Summary:   MD Notified: Dr. Doristine Bosworth Call Time: [place] Time: [place]  Netta Corrigan, RN

## 2021-05-15 NOTE — Progress Notes (Signed)
Notified by HD nurse that pt became "unresponsive" briefly while attempting to initiate treatment. Was also informed that HD cath is occluded, and TPA was administered. Patient has now returned to room and is back to baseline. VS stable. NRB mask removed and O2 97% on RA.

## 2021-05-15 NOTE — Progress Notes (Signed)
PROGRESS NOTE    Kirsten Mcmahon  FGH:[id number] DOB: Mar 02, 1946 DOA: 04/28/2021 PCP: [facility]   Brief Narrative:  75 year old F with history of Streptococcal bacteremia on IV vanc, ESRD on HD MWF, NIDDM-2, CVA with left hemiparesis, HTN, HLD and cognitive deficit presenting with confusion and admitted for encephalopathy felt to be due to uremia in the setting of missed HD.  His BUN was 66 on admission.  However, encephalopathy did not improve after dialysis.  Family reports progressive confusion since her fall 2 months prior.  She was also on clonidine patch which has been discontinued since admission.  CT head without acute finding but old right MCA infarct.  She is on Keppra 500 mg daily with additional 250 mg after HD.  Basic encephalopathy work-up including VBG, ammonia, TSH, B12 and RPR unrevealing.  Palliative medicine consulted.  Still full code with full scope of care and HD.  Renal navigator checking for HD unit in the area that will allow a chairside sitter, so family can sit with patient during dialysis session.  Assessment & Plan:   Principal Problem:   ESRD (end stage renal disease) ([place]) Active Problems:   Hypertension   Hypothyroidism   Type II diabetes mellitus with renal manifestations ([place])   Streptococcal bacteremia   Pressure injury of skin   Physical deconditioning   Generalized muscle weakness   Cerebral embolism with cerebral infarction   Major cognitive impairment/possible acute metabolic encephalopathy-initially felt to be due to uremia from missed HD but no improvement with dialysis.  She could have vascular dementia. CTH without acute finding but chronic right MCA CVA.  Other encephalopathy work-up including VBG, TSH, ammonia, B12 and RPR unrevealing.  Patient was also on clonidine patch that was discontinued since admission.  She is also on Keppra which is renally dosed but could still cause some degree of encephalopathy but I highly doubt  it since she has been on it for very long time.  Due to waxing and waning encephalopathy, MRI was done late evening 05/12/2021 which shows bilateral multiple punctate infarcts (see below). Patient is slightly more alert today and oriented x2 as well. -Avoid sedating medications.  Patient is at her best mental status today in the past 6 days that I have seen her, she was alert and oriented x3.  Denied any complaint.  Acute ischemic stroke: MRI completed late evening of 05/12/2021 shows bilateral multiple punctate infarcts.  Neurology was consulted.  They are going to do further work-up with MR angiogram of head, Doppler carotid negative for any carotid stenosis and and EEG negative as well.  Neurology recommended TEE.  TEE completed and it ruled out any thrombus or vegetation but confirmed PFO.  Neurology wants to make sure there is no DVT, Doppler lower extremity pending.  Continue aspirin and statin per neurology.   ESRD on HD MWF-Seems to be tolerating HD in a chair but requires sitter.   Bone mineral disorder -Renal navigator checking for HD unit in the area that will allow a chair side sitter.  -Palliative medicine following as well.  Dialysis per nephrology.   Streptococcus group G bacteremia-diagnosed prior hospitalization.  Family declined TTE at that time.  Plan was IV ampicillin until 06/01/2021 that seems to have changed to IV vancomycin with HD. -Continue IV vancomycin with HD   Anemia of chronic kidney disease: H&H stable.  -ESA and IV iron per nephrology   Essential hypertension: On metoprolol and clonidine patch at home.  Clonidine d/cd  earlier.  Blood pressure now better with resumption of Toprol-XL.   Hypothyroidism: TSH within normal -Continue Synthroid 112 mcg daily   Controlled NIDDM-2 with ESRD: A1c 5.7%.  CBG within appropriate range.  CBG monitoring discontinued.   History of seizures: Seems a stable. -Continue Keppra 500 mg daily and Keppra 250 mg three times weekly    History of right MCA CVA with residual left hemiparesis-   Pressure injury Mid buttocks, present on admission.   Goal of care counseling:   Poor long-term prognosis.  Seen by palliative care.  Still full code which I believe would pose more harm than benefit.  Patient is followed by [facility].  Previous hospitalist had extensive discussion with patient's son, [name] over the phone.  They discussed about the pros and cons of CPR and intubation.  He asked physicians opinion and he recommended DNR and DNI but he struggled to make a decision, and wanted to continue full code.   Nutrition Body mass index is 22.34 kg/m. Nutrition Problem: Increased nutrient needs Etiology: chronic illness (ESRD on HD) Signs/Symptoms: estimated needs Interventions: Nepro shake,MVI,Prostat Pressure Injury 04/29/21 Buttocks Mid Stage 2 -  Partial thickness loss of dermis presenting as a shallow open injury with a red, pink wound bed without slough. (Active) 04/29/21 0045 Location: Buttocks Location Orientation: Mid Staging: Stage 2 -  Partial thickness loss of dermis presenting as a shallow open injury with a red, pink wound bed without slough. Wound Description (Comments):  Present on Admission: Yes  DVT prophylaxis: heparin injection 5,000 Units Start: 04/30/21 2200   Code Status: Full Code  Family Communication:  None present at bedside.  Updated patient's son [name], he was very happy.  Status is: Inpatient  Remains inpatient appropriate because:Unsafe d/c plan   Dispo: The patient is from: Home              Anticipated d/c is to: Home potentially tomorrow              Patient currently is not medically stable to d/c.   Difficult to place patient No        Estimated body mass index is 22.78 kg/m as calculated from the following:   Height as of this encounter: 5\' 2"  (1.575 m).   Weight as of this encounter: 56.5 kg.  Pressure Injury 04/29/21 Buttocks Mid Stage 2 -  Partial thickness  loss of dermis presenting as a shallow open injury with a red, pink wound bed without slough. (Active)  04/29/21 0045  Location: Buttocks  Location Orientation: Mid  Staging: Stage 2 -  Partial thickness loss of dermis presenting as a shallow open injury with a red, pink wound bed without slough.  Wound Description (Comments):   Present on Admission: Yes     Nutritional status:  Nutrition Problem: Increased nutrient needs Etiology: chronic illness (ESRD on HD)   Signs/Symptoms: estimated needs   Interventions: Nepro shake, MVI, Prostat    Consultants:  Nephrology Palliative care Neurology  Procedures:  None  Antimicrobials:  Anti-infectives (From admission, onward)    Start     Dose/Rate Route Frequency Ordered Stop   05/07/21 1200  vancomycin (VANCOREADY) IVPB 500 mg/100 mL        500 mg 100 mL/hr over 60 Minutes Intravenous  Once 05/07/21 0803 05/07/21 1229   05/06/21 1200  vancomycin (VANCOCIN) IVPB 500 mg/100 ml premix        500 mg 100 mL/hr over 60 Minutes Intravenous Every M-W-F (Hemodialysis) 04/30/21  6503 06/01/21 2359   05/04/21 1800  vancomycin (VANCOREADY) IVPB 500 mg/100 mL  Status:  Discontinued        500 mg 100 mL/hr over 60 Minutes Intravenous  Once 05/04/21 1524 05/07/21 0803   05/04/21 1609  vancomycin (VANCOCIN) 500-5 MG/100ML-% IVPB       Note to Pharmacy: [name]  : cabinet override      05/04/21 1609 05/04/21 1613   05/01/21 1200  vancomycin (VANCOCIN) IVPB 500 mg/100 ml premix  Status:  Discontinued       Note to Pharmacy: For this week only she is going to receive hd on 5/26 and 5/28 so please give the Vancomycin. FOr next week back to mon wed fri   500 mg 100 mL/hr over 60 Minutes Intravenous Every M-W-F (Hemodialysis) 04/30/21 0723 04/30/21 0726   04/30/21 1800  vancomycin (VANCOCIN) IVPB 500 mg/100 ml premix       Note to Pharmacy: For this week only she is going to receive hd on 5/26 and 5/28 so please give the  Vancomycin. FOr next week back to mon wed fri   500 mg 100 mL/hr over 60 Minutes Intravenous Every T-Th-Sa (1800) 04/30/21 0727 05/02/21 1839   04/29/21 1539  vancomycin (VANCOCIN) 500-5 MG/100ML-% IVPB       Note to Pharmacy: [name]  : cabinet override      04/29/21 1539 04/29/21 1727   04/29/21 1200  vancomycin (VANCOREADY) IVPB 500 mg/100 mL  Status:  Discontinued        500 mg 100 mL/hr over 60 Minutes Intravenous Every M-W-F (Hemodialysis) 04/28/21 1812 04/30/21 5465          Subjective: Patient seen and examined this morning before TEE.  She was alert and oriented x3, the best I have seen in last 6 days.  Objective: Vitals:   05/15/21 1159 05/15/21 1205 05/15/21 1215 05/15/21 1324  BP: (!) 133/94 (!) 143/90 (!) 146/85 116/77  Pulse: (!) 107   95  Resp: 17 (!) 9 (!) 21 13  Temp: (!) 97.5 F (36.4 C)   97.7 F (36.5 C)  TempSrc: Temporal   Axillary  SpO2: 97%   91%  Weight:      Height:        Intake/Output Summary (Last 24 hours) at 05/15/2021 1412 Last data filed at 05/15/2021 1149 Gross per 24 hour  Intake 200 ml  Output --  Net 200 ml    Filed Weights   05/13/21 1400 05/13/21 1740 05/13/21 1801  Weight: 56.6 kg 56.5 kg 56.5 kg    Examination:  General exam: Appears calm and comfortable  Respiratory system: Clear to auscultation. Respiratory effort normal. Cardiovascular system: S1 & S2 heard, RRR. No JVD, murmurs, rubs, gallops or clicks. No pedal edema. Gastrointestinal system: Abdomen is nondistended, soft and nontender. No organomegaly or masses felt. Normal bowel sounds heard. Central nervous system: Alert and oriented x3.  Dense left hemiparesis Extremities: Symmetric 5 x 5 power.    Data Reviewed: I have personally reviewed following labs and imaging studies  CBC: Recent Labs  Lab 05/13/21 1416 05/15/21 1117  WBC 7.3  --   HGB 8.2* 7.5*  HCT 26.5* 22.0*  MCV 92.3  --   PLT 251  --     Basic Metabolic Panel: Recent Labs   Lab 05/13/21 1416 05/15/21 1117  NA 136 137  K 3.4* 4.1  CL 97* 101  CO2 28  --   GLUCOSE 197* 100*  BUN  30* 27*  CREATININE 5.59* 5.40*  CALCIUM 8.6*  --   PHOS 3.6  --     GFR: Estimated Creatinine Clearance: 7 mL/min (A) (by C-G formula based on SCr of 5.4 mg/dL (H)). Liver Function Tests: Recent Labs  Lab 05/13/21 1416  ALBUMIN 2.6*    No results for input(s): LIPASE, AMYLASE in the last 168 hours. No results for input(s): AMMONIA in the last 168 hours.  Coagulation Profile: No results for input(s): INR, PROTIME in the last 168 hours. Cardiac Enzymes: No results for input(s): CKTOTAL, CKMB, CKMBINDEX, TROPONINI in the last 168 hours. BNP (last 3 results) No results for input(s): PROBNP in the last 8760 hours. HbA1C: Recent Labs    05/13/21 0429  HGBA1C 5.6    CBG: Recent Labs  Lab 05/10/21 1201 05/10/21 1718 05/11/21 1955 05/12/21 0742 05/14/21 1545  GLUCAP 107* 104* 144* 90 85    Lipid Profile: Recent Labs    05/13/21 0429  LDLDIRECT 88.5    Thyroid Function Tests: No results for input(s): TSH, T4TOTAL, FREET4, T3FREE, THYROIDAB in the last 72 hours. Anemia Panel: No results for input(s): VITAMINB12, FOLATE, FERRITIN, TIBC, IRON, RETICCTPCT in the last 72 hours. Sepsis Labs: No results for input(s): PROCALCITON, LATICACIDVEN in the last 168 hours.  No results found for this or any previous visit (from the past 240 hour(s)).    Radiology Studies: ECHO TEE  Result Date: 05/15/2021    TRANSESOPHOGEAL ECHO REPORT   Patient Name:   Kirsten Mcmahon Date of Exam: 05/15/2021 Medical Rec #:  [id number]         Height:       62.0 in Accession #:    [id number]        Weight:       124.6 lb Date of Birth:  [birth year]          BSA:          1.563 m Patient Age:    64 years          BP:           159/64 mmHg Patient Gender: F                 HR:           93 bpm. Exam Location:  Inpatient Procedure: 2D Echo, Color Doppler and Saline Contrast Bubble Study  Indications:     Bacteremia. CVA.  History:         Patient has prior history of Echocardiogram examinations, most                  recent 04/20/2021.  Sonographer:     Philipp Deputy Referring Phys:  [id number] HAO MENG Diagnosing Phys: Candee Furbish MD PROCEDURE: After discussion of the risks and benefits of a TEE, an informed consent was obtained from the patient. The transesophogeal probe was passed without difficulty through the esophogus of the patient. Imaged were obtained with the patient in a left lateral decubitus position. Local oropharyngeal anesthetic was provided with viscous lidocaine. Sedation performed by different physician. Image quality was adequate. The patient developed no complications during the procedure. IMPRESSIONS  1. +PFO, positive bubble study.  2. Left ventricular ejection fraction, by estimation, is 65 to 70%. The left ventricle has normal function.  3. Right ventricular systolic function is normal. The right ventricular size is normal.  4. No left atrial/left atrial appendage thrombus was detected.  5. The mitral valve is normal in structure. No  evidence of mitral valve regurgitation. No evidence of mitral stenosis.  6. The aortic valve is tricuspid. Aortic valve regurgitation is mild. Mild aortic valve sclerosis is present, with no evidence of aortic valve stenosis.  7. Evidence of atrial level shunting detected by color flow Doppler. Agitated saline contrast bubble study was positive with shunting observed within 3-6 cardiac cycles suggestive of interatrial shunt. There is a small patent foramen ovale with predominantly right to left shunting across the atrial septum. Conclusion(s)/Recommendation(s): No evidence of vegetation/infective endocarditis on this transesophageal echocardiogram. +PFO, positive bubble study.  FINDINGS  Left Ventricle: Left ventricular ejection fraction, by estimation, is 65 to 70%. The left ventricle has normal function. The left ventricular internal cavity  size was small. Right Ventricle: The right ventricular size is normal. No increase in right ventricular wall thickness. Right ventricular systolic function is normal. Left Atrium: Left atrial size was normal in size. No left atrial/left atrial appendage thrombus was detected. Right Atrium: Right atrial size was normal in size. Pericardium: There is no evidence of pericardial effusion. Mitral Valve: The mitral valve is normal in structure. No evidence of mitral valve regurgitation. No evidence of mitral valve stenosis. Tricuspid Valve: The tricuspid valve is normal in structure. Tricuspid valve regurgitation is mild . No evidence of tricuspid stenosis. Aortic Valve: The aortic valve is tricuspid. Aortic valve regurgitation is mild. Mild aortic valve sclerosis is present, with no evidence of aortic valve stenosis. Pulmonic Valve: The pulmonic valve was normal in structure. Pulmonic valve regurgitation is not visualized. Aorta: The aortic root was not well visualized. Venous: The pulmonary veins were not well visualized. IAS/Shunts: Evidence of atrial level shunting detected by color flow Doppler. Agitated saline contrast was given intravenously to evaluate for intracardiac shunting. Agitated saline contrast bubble study was positive with shunting observed within 3-6 cardiac cycles suggestive of interatrial shunt. A small patent foramen ovale is detected with predominantly right to left shunting across the atrial septum. Additional Comments: +PFO, positive bubble study. Candee Furbish MD Electronically signed by Candee Furbish MD Signature Date/Time: 05/15/2021/12:24:43 PM    Final    VAS US CAROTID  Result Date: 05/15/2021 Carotid Arterial Duplex Study Patient Name:  Kirsten Mcmahon  Date of Exam:   05/14/2021 Medical Rec #: [id number]          Accession #:    [id number] Date of Birth: 1946-11-20           Patient Gender: F Patient Age:   74Y Exam Location:  [facility] Procedure:      VAS US CAROTID Referring  Phys: [id number] [place] --------------------------------------------------------------------------------  Indications:       CVA. Comparison Study:  No prior study Performing Technologist: Maudry Mayhew MHA, RDMS, RVT, RDCS  Examination Guidelines: A complete evaluation includes B-mode imaging, spectral Doppler, color Doppler, and power Doppler as needed of all accessible portions of each vessel. Bilateral testing is considered an integral part of a complete examination. Limited examinations for reoccurring indications may be performed as noted.  Right Carotid Findings: +----------+--------+--------+--------+------------------+--------+           PSV cm/sEDV cm/sStenosisPlaque DescriptionComments +----------+--------+--------+--------+------------------+--------+ CCA Prox  51      17                                         +----------+--------+--------+--------+------------------+--------+ CCA Distal43      10                                         +----------+--------+--------+--------+------------------+--------+  ICA Prox  42      14                                         +----------+--------+--------+--------+------------------+--------+ ECA       36      8                                          +----------+--------+--------+--------+------------------+--------+ +---------+--------+--------+---------+ VertebralPSV cm/sEDV cm/sAntegrade +---------+--------+--------+---------+  Left Carotid Findings: +----------+--------+--------+--------+-----------------------+--------+           PSV cm/sEDV cm/sStenosisPlaque Description     Comments +----------+--------+--------+--------+-----------------------+--------+ CCA Prox  41      11                                              +----------+--------+--------+--------+-----------------------+--------+ CCA Distal36      11                                               +----------+--------+--------+--------+-----------------------+--------+ ICA Prox  33      17                                              +----------+--------+--------+--------+-----------------------+--------+ ICA Distal44      12                                              +----------+--------+--------+--------+-----------------------+--------+ ECA       41      10              smooth and heterogenous         +----------+--------+--------+--------+-----------------------+--------+ +----------+--------+--------+----------------+-------------------+           PSV cm/sEDV cm/sDescribe        Arm Pressure (mmHG) +----------+--------+--------+----------------+-------------------+ [id number]              Multiphasic, WNL                    +----------+--------+--------+----------------+-------------------+ +---------+--------+--+--------+--+---------+ VertebralPSV cm/s52EDV cm/s18Antegrade +---------+--------+--+--------+--+---------+   Summary: Right Carotid: Velocities in the right ICA are consistent with a 1-39% stenosis. Left Carotid: Velocities in the left ICA are consistent with a 1-39% stenosis. Vertebrals:  Left vertebral artery demonstrates antegrade flow. Subclavians: Normal flow hemodynamics were seen in the left subclavian artery. *See table(s) above for measurements and observations.  Electronically signed by Antony Contras MD on 05/15/2021 at 11:57:13 AM.    Final      Scheduled Meds:  (feeding supplement) PROSource Plus  30 mL Oral TID BM   aspirin  81 mg Oral Daily   atorvastatin  40 mg Oral Daily   Chlorhexidine Gluconate Cloth  6 each Topical Q0600   darbepoetin (ARANESP) injection - DIALYSIS  25 mcg Intravenous Q Mon-HD   feeding supplement (NEPRO CARB STEADY)  237  mL Oral BID BM   heparin  5,000 Units Subcutaneous Q8H   latanoprost  1 drop Both Eyes QHS   letrozole  2.5 mg Oral Daily   levETIRAcetam  500 mg Oral Q1500   And   levETIRAcetam  250  mg Oral Once per day on Mon Wed Fri   levothyroxine  112 mcg Oral QAC breakfast   metoprolol succinate  50 mg Oral Daily   midodrine  5 mg Oral Q M,W,F-HD   multivitamin  1 tablet Oral QHS   vancomycin  500 mg Intravenous Q M,W,F-HD   Continuous Infusions:  albumin human       LOS: 17 days   Time spent: 29 -minute   Darliss Cheney, MD [facility]  05/15/2021, 2:12 PM   How to contact the [facility] Attending or Consulting provider [place] or covering provider during after hours [place], for this patient?  Check the care team in [facility] and look for a) attending/consulting [facility] provider listed and b) the [facility] team listed. Page or secure chat [phone]. Log into [email or web address] and use [place]'s universal password to access. If you do not have the password, please contact the hospital operator. Locate the [facility] provider you are looking for under [facility] and page to a number that you can be directly reached. If you still have difficulty reaching the provider, please page the [facility] (Director on Call) for the Hospitalists listed on amion for assistance.

## 2021-05-15 NOTE — Anesthesia Preprocedure Evaluation (Addendum)
Anesthesia Evaluation  Patient identified by MRN, date of birth, ID band Patient awake    Reviewed: Allergy & Precautions, H&P , NPO status , Patient's Chart, lab work & pertinent test results, reviewed documented beta blocker date and time   Airway Mallampati: III  TM Distance: >3 FB Neck ROM: Full    Dental no notable dental hx. (+) Teeth Intact, Dental Advisory Given   Pulmonary neg pulmonary ROS,    Pulmonary exam normal breath sounds clear to auscultation       Cardiovascular hypertension, Pt. on medications and Pt. on home beta blockers  Rhythm:Regular Rate:Normal     Neuro/Psych Depression CVA, Residual Symptoms    GI/Hepatic Neg liver ROS, GERD  ,  Endo/Other  diabetes, Type 2, Oral Hypoglycemic AgentsHypothyroidism   Renal/GU Renal InsufficiencyRenal disease  negative genitourinary   Musculoskeletal   Abdominal   Peds  Hematology  (+) Blood dyscrasia, anemia ,   Anesthesia Other Findings   Reproductive/Obstetrics negative OB ROS                            Anesthesia Physical Anesthesia Plan  ASA: 3  Anesthesia Plan: MAC   Post-op Pain Management:    Induction: Intravenous  PONV Risk Score and Plan: 2 and Propofol infusion and Treatment may vary due to age or medical condition  Airway Management Planned: Nasal Cannula  Additional Equipment:   Intra-op Plan:   Post-operative Plan:   Informed Consent: I have reviewed the patients History and Physical, chart, labs and discussed the procedure including the risks, benefits and alternatives for the proposed anesthesia with the patient or authorized representative who has indicated his/her understanding and acceptance.     Dental advisory given  Plan Discussed with: CRNA  Anesthesia Plan Comments:         Anesthesia Quick Evaluation

## 2021-05-15 NOTE — Progress Notes (Signed)
Lower extremity venous attempted. Patient not available. Will attempt again as schedule and patient availability permits.   Darlin Coco, RDMS, RVT

## 2021-05-15 NOTE — Interval H&P Note (Signed)
History and Physical Interval Note:  05/15/2021 10:46 AM  Kirsten Mcmahon  has presented today for surgery, with the diagnosis of STROKE.  The various methods of treatment have been discussed with the patient and family. After consideration of risks, benefits and other options for treatment, the patient has consented to  Procedure(s): TRANSESOPHAGEAL ECHOCARDIOGRAM (TEE) (N/A) as a surgical intervention.  The patient's history has been reviewed, patient examined, no change in status, stable for surgery.  I have reviewed the patient's chart and labs.  Questions were answered to the patient's satisfaction.     [employer]

## 2021-05-15 NOTE — Anesthesia Procedure Notes (Signed)
Procedure Name: MAC Date/Time: 05/15/2021 11:36 AM Performed by: Imagene Riches, CRNA Pre-anesthesia Checklist: Patient identified, Emergency Drugs available, Suction available, Patient being monitored and Timeout performed Patient Re-evaluated:Patient Re-evaluated prior to induction Oxygen Delivery Method: Nasal cannula

## 2021-05-15 NOTE — Progress Notes (Addendum)
   05/15/21 0250  Assess: MEWS Score  Temp 98.8 F (37.1 C)  BP 106/78  Pulse Rate (!) 136  ECG Heart Rate (!) 136  Resp 20  Level of Consciousness Alert  SpO2 96 %  O2 Device Room Air  Patient Activity (if Appropriate) In bed  Assess: MEWS Score  MEWS Temp 0  MEWS Systolic 0  MEWS Pulse 3  MEWS RR 0  MEWS LOC 0  MEWS Score 3  MEWS Score Color Yellow  Assess: if the MEWS score is Yellow or Red  Were vital signs taken at a resting state? Yes  Focused Assessment No change from prior assessment  Early Detection of Sepsis Score *See Row Information* Low  MEWS guidelines implemented *See Row Information* Yes  Treat  MEWS Interventions Escalated (See documentation below)  Pain Scale PAINAD  Breathing 0  Negative Vocalization 0  Facial Expression 0  Body Language 0  Consolability 0  PAINAD Score 0  Take Vital Signs  Increase Vital Sign Frequency  Yellow: Q 2hr X 2 then Q 4hr X 2, if remains yellow, continue Q 4hrs  Escalate  MEWS: Escalate Yellow: discuss with charge nurse/RN and consider discussing with provider and RRT  Notify: Charge Nurse/RN  Name of Charge Nurse/RN Notified Wenda Overland, RN  Date Charge Nurse/RN Notified 05/15/21  Time Charge Nurse/RN Notified 0250  Notify: Provider  Provider Name/Title Rathore, MD  Date Provider Notified 05/15/21  Time Provider Notified [phone]  Notification Type Page  Notification Reason Change in status  Provider response See new orders  Date of Provider Response 05/15/21  Time of Provider Response 0302   Patient alert, oriented to person only, resting in bed. Patient denies pain,SOB,dizziness. EKG done and handed to Marlowe Sax, MD. Will continue to monitor.  Metoprolol IV ordered. This RN entered patient's room to give medication, but HR sustaining 90s. Metoprolol not given per Marlowe Sax, MD. Will continue to monitor.

## 2021-05-15 NOTE — CV Procedure (Signed)
   Transesophageal Echocardiogram  Indications:Strokke  Time out performed Propofol administered by anesthesia.  Findings:  Left Ventricle: Normal ejection fraction 60%  Mitral Valve: Normal  Aortic Valve: Mild aortic regurgitation  Tricuspid Valve: Mild tricuspid regurgitation  Left Atrium: Normal.  Left atrial appendage not well visualized.  Bubble Contrast Study: Early bubble crossover suggestive of PFO.  Impression: Positive bubble study, PFO.  No vegetations.  Candee Furbish, MD

## 2021-05-16 ENCOUNTER — Inpatient Hospital Stay (HOSPITAL_COMMUNITY): Payer: Medicare (Managed Care)

## 2021-05-16 ENCOUNTER — Encounter (HOSPITAL_COMMUNITY): Payer: Medicare (Managed Care)

## 2021-05-16 DIAGNOSIS — Q211 Atrial septal defect: Secondary | ICD-10-CM

## 2021-05-16 DIAGNOSIS — I639 Cerebral infarction, unspecified: Secondary | ICD-10-CM

## 2021-05-16 LAB — CBC
HCT: 23.8 % — ABNORMAL LOW (ref 36.0–46.0)
Hemoglobin: 7.1 g/dL — ABNORMAL LOW (ref 12.0–15.0)
MCH: 28.4 pg (ref 26.0–34.0)
MCHC: 29.8 g/dL — ABNORMAL LOW (ref 30.0–36.0)
MCV: 95.2 fL (ref 80.0–100.0)
Platelets: 291 10*3/uL (ref 150–400)
RBC: 2.5 MIL/uL — ABNORMAL LOW (ref 3.87–5.11)
RDW: 14.6 % (ref 11.5–15.5)
WBC: 8.1 10*3/uL (ref 4.0–10.5)
nRBC: 0 % (ref 0.0–0.2)

## 2021-05-16 LAB — RENAL FUNCTION PANEL
Albumin: 2.6 g/dL — ABNORMAL LOW (ref 3.5–5.0)
Anion gap: 7 (ref 5–15)
BUN: 11 mg/dL (ref 8–23)
CO2: 29 mmol/L (ref 22–32)
Calcium: 7.8 mg/dL — ABNORMAL LOW (ref 8.9–10.3)
Chloride: 101 mmol/L (ref 98–111)
Creatinine, Ser: 2.47 mg/dL — ABNORMAL HIGH (ref 0.44–1.00)
GFR, Estimated: 20 mL/min — ABNORMAL LOW (ref >60)
Glucose, Bld: 104 mg/dL — ABNORMAL HIGH (ref 70–99)
Phosphorus: 1.7 mg/dL — ABNORMAL LOW (ref 2.5–4.6)
Potassium: 4 mmol/L (ref 3.5–5.1)
Sodium: 137 mmol/L (ref 135–145)

## 2021-05-16 LAB — PREPARE RBC (CROSSMATCH)

## 2021-05-16 MED ORDER — ATORVASTATIN CALCIUM 40 MG PO TABS: 40.0000 mg | ORAL_TABLET | Freq: Every day | ORAL | 0 refills | Status: DC

## 2021-05-16 MED ORDER — LIDOCAINE-PRILOCAINE 2.5-2.5 % EX CREA: 1.0000 "application " | TOPICAL_CREAM | CUTANEOUS | Status: DC | PRN

## 2021-05-16 MED ORDER — ALTEPLASE 2 MG IJ SOLR: 2.0000 mg | Freq: Once | INTRAMUSCULAR | Status: DC | PRN

## 2021-05-16 MED ORDER — POTASSIUM PHOSPHATES 15 MMOLE/5ML IV SOLN
15.0000 mmol | Freq: Once | INTRAVENOUS | Status: AC
  Administered 2021-05-16: 15 mmol via INTRAVENOUS
  Filled 2021-05-16: qty 5

## 2021-05-16 MED ORDER — PENTAFLUOROPROP-TETRAFLUOROETH EX AERO: 1.0000 "application " | INHALATION_SPRAY | CUTANEOUS | Status: DC | PRN

## 2021-05-16 MED ORDER — VANCOMYCIN HCL IN DEXTROSE 500-5 MG/100ML-% IV SOLN
INTRAVENOUS | Status: AC
  Filled 2021-05-16: qty 100

## 2021-05-16 MED ORDER — MIDODRINE HCL 5 MG PO TABS: 5.0000 mg | ORAL_TABLET | ORAL | 0 refills | Status: AC

## 2021-05-16 MED ORDER — VANCOMYCIN HCL IN DEXTROSE 500-5 MG/100ML-% IV SOLN
500.0000 mg | Freq: Once | INTRAVENOUS | Status: AC
  Administered 2021-05-16: 500 mg via INTRAVENOUS

## 2021-05-16 MED ORDER — HEPARIN SODIUM (PORCINE) 1000 UNIT/ML DIALYSIS: 1000.0000 [IU] | INTRAMUSCULAR | Status: DC | PRN

## 2021-05-16 MED ORDER — ALBUMIN HUMAN 25 % IV SOLN
INTRAVENOUS | Status: AC
  Administered 2021-05-16: 25 g via INTRAVENOUS
  Filled 2021-05-16: qty 100

## 2021-05-16 MED ORDER — SODIUM CHLORIDE 0.9 % IV SOLN: 100.0000 mL | INTRAVENOUS | Status: DC | PRN

## 2021-05-16 MED ORDER — HEPARIN SODIUM (PORCINE) 1000 UNIT/ML IJ SOLN
INTRAMUSCULAR | Status: AC
  Administered 2021-05-16: 3800 [IU] via INTRAVENOUS_CENTRAL
  Filled 2021-05-16: qty 4

## 2021-05-16 MED ORDER — SODIUM CHLORIDE 0.9% IV SOLUTION: Freq: Once | INTRAVENOUS | Status: AC

## 2021-05-16 MED ORDER — LIDOCAINE HCL (PF) 1 % IJ SOLN: 5.0000 mL | INTRAMUSCULAR | Status: DC | PRN

## 2021-05-16 NOTE — Plan of Care (Signed)
  Problem: Education: Goal: Knowledge of General Education information will improve Description: Including pain rating scale, medication(s)/side effects and non-pharmacologic comfort measures Outcome: Adequate for Discharge   Problem: Health Behavior/Discharge Planning: Goal: Ability to manage health-related needs will improve Outcome: Adequate for Discharge   Problem: Clinical Measurements: Goal: Ability to maintain clinical measurements within normal limits will improve Outcome: Adequate for Discharge Goal: Will remain free from infection Outcome: Adequate for Discharge Goal: Diagnostic test results will improve Outcome: Adequate for Discharge Goal: Respiratory complications will improve Outcome: Adequate for Discharge Goal: Cardiovascular complication will be avoided Outcome: Adequate for Discharge   Problem: Activity: Goal: Risk for activity intolerance will decrease Outcome: Adequate for Discharge   Problem: Nutrition: Goal: Adequate nutrition will be maintained Outcome: Adequate for Discharge   Problem: Coping: Goal: Level of anxiety will decrease Outcome: Adequate for Discharge   Problem: Elimination: Goal: Will not experience complications related to bowel motility Outcome: Adequate for Discharge Goal: Will not experience complications related to urinary retention Outcome: Adequate for Discharge   Problem: Pain Managment: Goal: General experience of comfort will improve Outcome: Adequate for Discharge   Problem: Safety: Goal: Ability to remain free from injury will improve Outcome: Adequate for Discharge   Problem: Skin Integrity: Goal: Risk for impaired skin integrity will decrease Outcome: Adequate for Discharge   Problem: Education: Goal: Knowledge of disease and its progression will improve Outcome: Adequate for Discharge Goal: Individualized Educational Video(s) Outcome: Adequate for Discharge   Problem: Fluid Volume: Goal: Compliance with  measures to maintain balanced fluid volume will improve Outcome: Adequate for Discharge   Problem: Health Behavior/Discharge Planning: Goal: Ability to manage health-related needs will improve Outcome: Adequate for Discharge   Problem: Nutritional: Goal: Ability to make healthy dietary choices will improve Outcome: Adequate for Discharge   Problem: Clinical Measurements: Goal: Complications related to the disease process, condition or treatment will be avoided or minimized Outcome: Adequate for Discharge   Problem: Education: Goal: Knowledge of disease or condition will improve Outcome: Adequate for Discharge   Problem: Education: Goal: Knowledge of disease or condition will improve Outcome: Adequate for Discharge Goal: Knowledge of secondary prevention will improve Outcome: Adequate for Discharge Goal: Knowledge of patient specific risk factors addressed and post discharge goals established will improve Outcome: Adequate for Discharge Goal: Individualized Educational Video(s) Outcome: Adequate for Discharge

## 2021-05-16 NOTE — TOC Transition Note (Signed)
Transition of Care [facility]) - CM/SW Discharge Note   Patient Details  Name: Kirsten Mcmahon MRN: [id number] Date of Birth: 1945/12/28  Transition of Care [facility]) CM/SW Contact:  Joanne Chars, LCSW Phone Number: 05/16/2021, 3:22 PM   Clinical Narrative:   Pt is discharging to son's home at [place], [place].  CSW spoke with son [name] who is requesting call when pt leaves, informed him that pt is scheduled for pickup at 1800 but could be later.  CSW spoke with [name] [phone], [facility].  They do not provide transport on weekends, will have to use [facility].  She is meeting with the pt and son tomorrow, HD is all set up for Monday and [facility] is resuming services at [place].    [facility] contacted with requested pick up time of 6pm.     Final next level of care: Home/Self Care Barriers to Discharge: Barriers Resolved   Patient Goals and CMS Choice        Discharge Placement                Patient to be transferred to facility by: [place] Name of family member notified: son [name] Patient and family notified of of transfer: 05/16/21  Discharge Plan and Services   Discharge Planning Services: CM Consult                                 Social Determinants of Health (SDOH) Interventions     Readmission Risk Interventions Readmission Risk Prevention Plan 05/16/2021 04/11/2021  Transportation Screening Complete Complete  PCP or Specialist Appt within 3-5 Days - Complete  [place] or [place] - Complete  Social Work Consult for [place] Planning/Counseling - Complete  Palliative Care Screening - Not Applicable  Medication Review Press photographer) Complete Complete  PCP or Specialist appointment within 3-5 days of discharge Complete -  [place] or [place] Complete -  SW Recovery Care/Counseling Consult Complete -  Palliative Care Screening Complete -  [place] Patient Refused -  Some recent data  might be hidden

## 2021-05-16 NOTE — Progress Notes (Signed)
Patient discharged home per orders. Discharge instructions in packet with patient. IV and tele discontinued per orders. Patient tolerated well. Belongings packed up by staff. Denies needs at this time. Patient off unit via stretcher by [facility].
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
## 2021-05-16 NOTE — Discharge Summary (Signed)
Physician Discharge Summary  Kirsten Mcmahon FTD:[id number] DOB: 1946-04-25 DOA: 04/28/2021  PCP: [place] date: 04/28/2021 Discharge date: 05/16/2021 30 Day Unplanned Readmission Risk Score    Flowsheet Row ED to Hosp-Admission (Current) from 04/28/2021 in [facility]  30 Day Unplanned Readmission Risk Score (%) 41.72 Filed at 05/16/2021 0800       This score is the patient's risk of an unplanned readmission within 30 days of being discharged (0 -100%). The score is based on dignosis, age, lab data, medications, orders, and past utilization.   Low:  0-14.9   Medium: 15-21.9   High: 22-29.9   Extreme: 30 and above           Admitted From: Home Disposition: Home  Recommendations for Outpatient Follow-up:  Follow up with PCP in 1-2 weeks Please obtain BMP/CBC in one week Please follow up with your PCP on the following pending results: Unresulted Labs (From admission, onward)     Start     Ordered   05/17/21 0500  Phosphorus  Tomorrow morning,   R       Question:  Specimen collection method  Answer:  Lab=Lab collect   05/16/21 1102   Signed and Held  Renal function panel  Once,   R       Question:  Specimen collection method  Answer:  Lab=Lab collect   Signed and Held   Signed and Held  CBC  Once,   R       Question:  Specimen collection method  Answer:  Lab=Lab collect   Signed and Held   Signed and Held  Renal function panel  Once,   R       Question:  Specimen collection method  Answer:  Lab=Lab collect   Signed and Held   Signed and Held  CBC  Once,   R       Question:  Specimen collection method  Answer:  Lab=Lab collect   Signed and Held   Signed and Held  Renal function panel  Once,   R       Question:  Specimen collection method  Answer:  Lab=Lab collect   Signed and Held   Signed and Held  CBC  Once,   R       Question:  Specimen collection method  Answer:  Lab=Lab collect   Signed and Held               Home Health: Yes Equipment/Devices: None, she will really has all DME available at home  Discharge Condition: Fair CODE STATUS: Full code Diet recommendation: Renal and cardiac  Subjective: Seen and examined in dialysis unit.  Alert but partially oriented at her baseline.  Did not talk much.  Brief/Interim Summary: 75 year old F with history of Streptococcal bacteremia on IV vanc, ESRD on HD MWF, NIDDM-2, CVA with left hemiparesis, HTN, HLD and cognitive deficit presented with confusion and admitted for encephalopathy felt to be due to uremia in the setting of missed HD.  77 BUN was 66 on admission.  However, encephalopathy did not improve after dialysis.  Family reported progressive confusion since her fall 2 months prior.  She was also on clonidine patch which was discontinued since admission.  CT head without acute finding but old right MCA infarct.  She is on Keppra 500 mg daily with additional 250 mg after HD.  Basic encephalopathy work-up including VBG, ammonia, TSH, B12 and RPR unrevealing.  Palliative  medicine consulted.  Son adamant about keeping her full code with full scope of care and HD.  Due to waxing and waning encephalopathy, MRI was done late evening which shows bilateral multiple punctate infarcts (see below).    Acute ischemic stroke: MRI completed late evening of 05/12/2021 shows bilateral multiple punctate infarcts.  Neurology was consulted. MR angiogram of head, Doppler carotid negative for any carotid stenosis and and EEG negative as well.  Neurology recommended TEE.  TEE completed and it ruled out any thrombus or vegetation but confirmed PFO.  Neurology wants to make sure there is no DVT, Doppler lower extremity pending.  They recommended to continue aspirin and statin per neurology.  Her Doppler lower extremity is still is pending.  If that will be positive, patient will be discharged on Eliquis and no aspirin.  If negative, she will continue aspirin.  Neurology also opined  that her lacunar infarcts are so small and likely are not the cause of her encephalopathy.  Patient continued to have waxing and waning encephalopathy.  No other source was identified.    ESRD on HD MWF-Seems to be tolerating HD in a chair but requires sitter.  Her son has opted to go and sit with her on all dialysis sessions.  Arrangements have been made for him at patient's outpatient dialysis center.   Streptococcus group G bacteremia-diagnosed prior hospitalization.  Family declined TEE at that time.  Plan was IV ampicillin until 06/01/2021 that seems to have changed to IV vancomycin with HD. -Continue IV vancomycin with HD   Anemia of chronic kidney disease: H&H dropped to 7.1 today and patient received 1 unit of PRBC transfusion with dialysis today.   Essential hypertension: On metoprolol and clonidine patch at home.  Clonidine d/cd earlier.  Blood pressure now better with resumption of Toprol-XL.   Hypothyroidism: TSH within normal -Continue Synthroid 112 mcg daily   Controlled NIDDM-2 with ESRD: A1c 5.7%.  SSI was discontinued during hospitalization and at the time of discharge, Januvia is being discontinued.   History of seizures: Seems a stable. -Continue Keppra 500 mg daily and Keppra 250 mg three times weekly   History of right MCA CVA with residual left hemiparesis-   Pressure injury Mid buttocks, present on admission.   Goal of care counseling:  Poor long-term prognosis.  Seen by palliative care.  Still full code which I believe would pose more harm than benefit.  Patient is followed by [facility].  Previous hospitalist had extensive discussion with patient's son, [name] over the phone.  They discussed about the pros and cons of CPR and intubation.  He asked physicians opinion and he recommended DNR and DNI but he struggled to make a decision, and wanted to continue full code.   Nutrition Body mass index is 22.34 kg/m. Nutrition Problem: Increased nutrient  needs Etiology: chronic illness (ESRD on HD) Signs/Symptoms: estimated needs Interventions: Nepro shake,MVI,Prostat Pressure Injury 04/29/21 Buttocks Mid Stage 2 -  Partial thickness loss of dermis presenting as a shallow open injury with a red, pink wound bed without slough. (Active) 04/29/21 0045 Location: Buttocks Location Orientation: Mid Staging: Stage 2 -  Partial thickness loss of dermis presenting as a shallow open injury with a red, pink wound bed without slough. Wound Description (Comments): Present on Admission: Yes  PS: I was in constant communication with the son [name] and continued to update him about his mother on daily basis since I assumed care of this patient.  We also had a  lengthy meeting with patient's insurance plan [employer] other day.  Everything has been arranged for this patient to continue dialysis as outpatient and her son, his girlfriend and her daughter have opted to take care of her at home.  Discharge Diagnoses:  Principal Problem:   ESRD (end stage renal disease) ([place]) Active Problems:   Hypertension   Hypothyroidism   Type II diabetes mellitus with renal manifestations ([place])   Streptococcal bacteremia   Pressure injury of skin   Physical deconditioning   Generalized muscle weakness   Cerebral embolism with cerebral infarction    Discharge Instructions   Allergies as of 05/16/2021       Reactions   Contrast Media [iodinated Diagnostic Agents]    Other reaction(s): NO ALLERGY   Latex    Other reaction(s): NO ALLERGY   Shellfish-derived Products    Other reaction(s): NO ALLERGY   Levemir [insulin Detemir] Itching        Medication List     STOP taking these medications    cloNIDine 0.2 mg/24hr patch Commonly known as: CATAPRES - Dosed in mg/24 hr   Januvia 25 MG tablet Generic drug: sitaGLIPtin       TAKE these medications    aspirin EC 81 MG tablet Take 81 mg by mouth daily.   atorvastatin 40 MG tablet Commonly known as:  LIPITOR Take 1 tablet (40 mg total) by mouth daily. Start taking on: May 17, 2021   cholecalciferol 1000 units tablet Commonly known as: VITAMIN D Take 1,000 Units by mouth daily.   Keppra XR 500 MG 24 hr tablet Generic drug: levETIRAcetam Take 500 mg by mouth daily.   latanoprost 0.005 % ophthalmic solution Commonly known as: XALATAN Place 1 drop into both eyes at bedtime.   letrozole 2.5 MG tablet Commonly known as: FEMARA Take 1 tablet (2.5 mg total) by mouth daily.   levothyroxine 112 MCG tablet Commonly known as: SYNTHROID Take 1 tablet (112 mcg total) by mouth daily before breakfast.   metoprolol succinate 25 MG 24 hr tablet Commonly known as: TOPROL-XL Take 50 mg by mouth daily.   midodrine 5 MG tablet Commonly known as: PROAMATINE Take 1 tablet (5 mg total) by mouth every Monday, Wednesday, and Friday with hemodialysis.   vancomycin 500 MG/100ML IVPB Commonly known as: VANCOREADY Inject 100 mLs (500 mg total) into the vein every Monday, Wednesday, and Friday with hemodialysis.        Follow-up [facility] up in 1 week(s).   Contact information: [address] [place] [address] [phone]                Allergies  Allergen Reactions   Contrast Media [Iodinated Diagnostic Agents]     Other reaction(s): NO ALLERGY   Latex     Other reaction(s): NO ALLERGY   Shellfish-Derived Products     Other reaction(s): NO ALLERGY   Levemir [Insulin Detemir] Itching    Consultations: Nephrology, palliative care, neurology   Procedures/Studies: CT HEAD WO CONTRAST  Result Date: 05/07/2021 CLINICAL DATA:  Encephalopathy EXAM: CT HEAD WITHOUT CONTRAST TECHNIQUE: Contiguous axial images were obtained from the base of the skull through the vertex without intravenous contrast. COMPARISON:  None. FINDINGS: Brain: There is no mass, hemorrhage or extra-axial collection. There is generalized atrophy without  lobar predilection. Hypodensity of the white matter is most commonly associated with chronic microvascular disease. Old right MCA territory infarct. Vascular: No abnormal hyperdensity of the  major intracranial arteries or dural venous sinuses. No intracranial atherosclerosis. Skull: Remote right-sided craniectomy Sinuses/Orbits: No fluid levels or advanced mucosal thickening of the visualized paranasal sinuses. No mastoid or middle ear effusion. The orbits are normal. IMPRESSION: 1. No acute intracranial abnormality. 2. Old right MCA territory infarct and findings of chronic microvascular ischemia. Electronically Signed   By: Ulyses Jarred M.D.   On: 05/07/2021 19:47   MR ANGIO HEAD WO CONTRAST  Result Date: 05/13/2021 CLINICAL DATA:  Neuro deficit, acute, stroke suspected. EXAM: MRA HEAD WITHOUT CONTRAST TECHNIQUE: Angiographic images of the Circle of Willis were acquired using MRA technique without intravenous contrast. COMPARISON:  MRI of the brain May 12, 2021. FINDINGS: The study is severely degraded by motion. Most images are essentially nondiagnostic. Anterior circulation: Correlated identified in the intracranial internal carotid arteries and proximal M1 and A1 segments. Posterior circulation: Flow related enhancement noted in the basilar artery and bilateral vertebral arteries Other: None. IMPRESSION: 1. Severely degraded study due to motion artifact. Most images are essentially nondiagnostic. 2. Flow related enhancement seen in the intracranial internal carotid arteries, proximal bilateral M1/MCA segments and A1/ACA segments. 3. Flow related enhancement in the bilateral vertebral arteries and basilar artery. 4. Remainder of the vessels cannot be evaluated. Electronically Signed   By: Pedro Earls M.D.   On: 05/13/2021 12:48   MR BRAIN WO CONTRAST  Result Date: 05/12/2021 CLINICAL DATA:  Encephalopathy EXAM: MRI HEAD WITHOUT CONTRAST TECHNIQUE: Multiplanar, multiecho pulse sequences  of the brain and surrounding structures were obtained without intravenous contrast. COMPARISON:  01/17/2021 FINDINGS: DWI, sagittal T1, and axial T2 sequences were attempted. There is significant motion artifact. Brain: Punctate foci of reduced diffusion are present in the right temporal lobe, right thalamus, left thalamus, and left parietal lobe. Large right MCA territory infarction. Wallerian degeneration along the right cerebral peduncle. Additional scattered smaller chronic infarcts. No significant mass effect. No hydrocephalus. Vascular: Major vessel flow voids at the skull base grossly preserved. Skull and upper cervical spine: Normal marrow signal is grossly preserved. Sinuses/Orbits: Paranasal sinuses are aerated. Orbits are grossly unremarkable. Other: None. IMPRESSION: Significantly motion degraded study. Patient could not tolerate all sequences. Few punctate acute infarcts of right temporal lobe, bilateral thalamus, and left parietal lobe. Electronically Signed   By: Macy Mis M.D.   On: 05/12/2021 18:51   IR Fluoro Guide CV Line Right  Result Date: 04/30/2021 INDICATION: Poorly functioning tunneled dialysis catheter EXAM: Tunneled dialysis catheter exchange MEDICATIONS: None ANESTHESIA/SEDATION: None FLUOROSCOPY TIME:  Fluoroscopy Time: 0 minutes 30 seconds (1 mGy). COMPLICATIONS: None immediate. PROCEDURE: Informed written consent was obtained from the patient's son after a thorough discussion of the procedural risks, benefits and alternatives. All questions were addressed. Maximal Sterile Barrier Technique was utilized including caps, mask, sterile gowns, sterile gloves, sterile drape, hand hygiene and skin antiseptic. A timeout was performed prior to the initiation of the procedure. Patient positioned supine on the procedure table. The external segment of the existing tunneled IJ catheter and surrounding skin prepped and draped usual fashion. Following local lidocaine administration, the  existing catheter was removed over a 0.035 inch guidewire under fluoroscopy. New 23 cm tunneled dialysis catheter was inserted over the guidewire. Tip positioned in the right atrium. Both lumens aspirated and flushed well and were locked with heparin. Catheter secured to skin with suture and insertion site covered with Biopatch and sterile dressing. IMPRESSION: Successful exchange of poorly functioning right IJ dialysis catheter. New 23 cm tunneled hemodialysis catheter in place  and ready for use. Electronically Signed   By: Miachel Roux M.D.   On: 04/30/2021 15:59   DG CHEST PORT 1 VIEW  Result Date: 05/04/2021 CLINICAL DATA:  Hypoxia EXAM: PORTABLE CHEST 1 VIEW COMPARISON:  Apr 28, 2021, Apr 17, 2021 FINDINGS: Evaluation is limited secondary to patient positioning. Unchanged enlarged cardiomediastinal silhouette when accounting for patient rotation. RIGHT IJ CVC tip terminates over the RIGHT atrium. No pleural effusion or pneumothorax. Surgical clips project over the neck. No acute pleuroparenchymal abnormality. Unchanged elevation of the RIGHT hemidiaphragm. Degenerative changes of the thoracic spine. IMPRESSION: No acute cardiopulmonary abnormality. Electronically Signed   By: Valentino Saxon MD   On: 05/04/2021 19:00   DG Chest Port 1 View  Result Date: 04/28/2021 CLINICAL DATA:  75 year old female with history of shortness of breath. EXAM: PORTABLE CHEST 1 VIEW COMPARISON:  Chest x-ray 04/17/2021. FINDINGS: Right internal jugular PermCath with tips terminating in the right atrium. Lung volumes are low. No consolidative airspace disease. No pleural effusions. No pneumothorax. No pulmonary nodule or mass noted. Pulmonary vasculature and the cardiomediastinal silhouette are within normal limits. Surgical clips project over the right breast, likely from prior lumpectomy. IMPRESSION: 1. Low lung volumes without radiographic evidence of acute cardiopulmonary disease. Electronically Signed   By: Vinnie Langton M.D.   On: 04/28/2021 16:11   DG Chest Port 1 View  Result Date: 04/17/2021 CLINICAL DATA:  Fever and leukocytosis. EXAM: PORTABLE CHEST 1 VIEW COMPARISON:  Chest x-ray dated Apr 09, 2021. FINDINGS: Patient is rotated to the right. New tunneled right internal jugular dialysis catheter with distal lumen tip in the right atrium. Unchanged cardiomegaly. Normal pulmonary vascularity. No focal consolidation, pleural effusion, or pneumothorax. No acute osseous abnormality. Old healed displaced fracture of the left humeral diaphysis. IMPRESSION: 1. No active disease. Electronically Signed   By: Titus Dubin M.D.   On: 04/17/2021 16:00   EEG adult  Result Date: 05/13/2021 Lora Havens, MD     05/13/2021 11:58 AM Patient Name: LYLIANA DICENSO MRN: [id number] Epilepsy Attending: Lora Havens Referring Physician/Provider: Dr Donnetta Simpers Date: 05/13/2021 Duration: 26.07 mins Patient history: 74 year old female with altered mental status.  EEG to evaluate for seizures. Level of alertness: Awake AEDs during EEG study: Keppra Technical aspects: This EEG study was done with scalp electrodes positioned according to the 10-20 International system of electrode placement. Electrical activity was acquired at a sampling rate of 500Hz  and reviewed with a high frequency filter of 70Hz  and a low frequency filter of 1Hz . EEG data were recorded continuously and digitally stored. Description:  The posterior dominant rhythm consists of 8 Hz activity of moderate voltage (25-35 uV) seen predominantly in posterior head regions, symmetric and reactive to eye opening and eye closing.  EEG showed continuous generalized 3 to 5 Hz theta-delta slowing.  There is also sharply contoured 3 to 5 Hz theta-delta slowing in right frontal region consistent with underlying breech artifact.  Sharp transients were seen right parietal region. Hyperventilation and photic stimulation were not performed.   ABNORMALITY -Continuous slow,  generalized - Breach artifact, right frontal region IMPRESSION: This study is suggestive of cortical dysfunction in right frontal region consistent with prior craniotomy.  Additionally there is evidence of moderate diffuse encephalopathy, nonspecific etiology.  No seizures or definite epileptiform discharges were seen throughout the recording. Lora Havens   ECHOCARDIOGRAM COMPLETE  Result Date: 04/20/2021    ECHOCARDIOGRAM REPORT   Patient Name:   Kirsten Mcmahon Date of  Exam: 04/20/2021 Medical Rec #:  [id number]         Height:       62.0 in Accession #:    [id number]        Weight:       128.2 lb Date of Birth:  1946/05/22          BSA:          1.582 m Patient Age:    51 years          BP:           148/75 mmHg Patient Gender: F                 HR:           82 bpm. Exam Location:  [facility] Procedure: 2D Echo, Cardiac Doppler and Color Doppler Indications:     Bacteremia R78.81  History:         Patient has prior history of Echocardiogram examinations, most                  recent 07/02/2019. Stroke; Risk Factors:Hypertension.  Sonographer:     Sherrie Sport RDCS (AE) Referring Phys:  [place] Diagnosing Phys: Kathlyn Sacramento MD  Sonographer Comments: Suboptimal apical window. IMPRESSIONS  1. Left ventricular ejection fraction, by estimation, is 55 to 60%. The left ventricle has normal function. The left ventricle has no regional wall motion abnormalities. There is mild left ventricular hypertrophy. Left ventricular diastolic parameters are consistent with Grade I diastolic dysfunction (impaired relaxation).  2. Right ventricular systolic function is normal. The right ventricular size is normal. Tricuspid regurgitation signal is inadequate for assessing PA pressure.  3. Left atrial size was mildly dilated.  4. The mitral valve is normal in structure. No evidence of mitral valve regurgitation. No evidence of mitral stenosis.  5. The aortic valve is normal in structure. Aortic valve regurgitation is  trivial. No aortic stenosis is present.  6. The inferior vena cava is normal in size with greater than 50% respiratory variability, suggesting right atrial pressure of 3 mmHg. FINDINGS  Left Ventricle: Left ventricular ejection fraction, by estimation, is 55 to 60%. The left ventricle has normal function. The left ventricle has no regional wall motion abnormalities. The left ventricular internal cavity size was normal in size. There is  mild left ventricular hypertrophy. Left ventricular diastolic parameters are consistent with Grade I diastolic dysfunction (impaired relaxation). Right Ventricle: The right ventricular size is normal. No increase in right ventricular wall thickness. Right ventricular systolic function is normal. Tricuspid regurgitation signal is inadequate for assessing PA pressure. Left Atrium: Left atrial size was mildly dilated. Right Atrium: Right atrial size was normal in size. Pericardium: There is no evidence of pericardial effusion. Mitral Valve: The mitral valve is normal in structure. No evidence of mitral valve regurgitation. No evidence of mitral valve stenosis. Tricuspid Valve: The tricuspid valve is normal in structure. Tricuspid valve regurgitation is not demonstrated. No evidence of tricuspid stenosis. Aortic Valve: The aortic valve is normal in structure. Aortic valve regurgitation is trivial. No aortic stenosis is present. Aortic valve mean gradient measures 4.3 mmHg. Aortic valve peak gradient measures 8.0 mmHg. Aortic valve area, by VTI measures 2.04 cm. Pulmonic Valve: The pulmonic valve was normal in structure. Pulmonic valve regurgitation is not visualized. No evidence of pulmonic stenosis. Aorta: The aortic root is normal in size and structure. Venous: The inferior vena cava is normal in size with greater than 50% respiratory  variability, suggesting right atrial pressure of 3 mmHg. IAS/Shunts: No atrial level shunt detected by color flow Doppler.  LEFT VENTRICLE PLAX 2D  LVIDd:         3.72 cm  Diastology LVIDs:         2.63 cm  LV e' medial:    5.98 cm/s LV PW:         1.76 cm  LV E/e' medial:  10.0 LV IVS:        0.93 cm  LV e' lateral:   4.68 cm/s LVOT diam:     2.10 cm  LV E/e' lateral: 12.8 LV SV:         42 LV SV Index:   27 LVOT Area:     3.46 cm  RIGHT VENTRICLE RV Basal diam:  1.92 cm RV S prime:     12.10 cm/s TAPSE (M-mode): 3.0 cm LEFT ATRIUM             Index       RIGHT ATRIUM           Index LA diam:        3.00 cm 1.90 cm/m  RA Area:     11.70 cm LA Vol (A2C):   61.0 ml 38.55 ml/m RA Volume:   24.70 ml  15.61 ml/m LA Vol (A4C):   50.8 ml 32.10 ml/m LA Biplane Vol: 56.7 ml 35.83 ml/m  AORTIC VALVE                   PULMONIC VALVE AV Area (Vmax):    1.75 cm    PV Vmax:        0.87 m/s AV Area (Vmean):   1.78 cm    PV Peak grad:   3.1 mmHg AV Area (VTI):     2.04 cm    RVOT Peak grad: 5 mmHg AV Vmax:           141.00 cm/s AV Vmean:          96.833 cm/s AV VTI:            0.207 m AV Peak Grad:      8.0 mmHg AV Mean Grad:      4.3 mmHg LVOT Vmax:         71.10 cm/s LVOT Vmean:        49.700 cm/s LVOT VTI:          0.122 m LVOT/AV VTI ratio: 0.59  AORTA Ao Root diam: 3.20 cm MITRAL VALVE               TRICUSPID VALVE MV Area (PHT): 4.93 cm    TR Peak grad:   14.4 mmHg MV Decel Time: 154 msec    TR Vmax:        190.00 cm/s MV E velocity: 60.00 cm/s MV A velocity: 98.50 cm/s  SHUNTS MV E/A ratio:  0.61        Systemic VTI:  0.12 m                            Systemic Diam: 2.10 cm Kathlyn Sacramento MD Electronically signed by Kathlyn Sacramento MD Signature Date/Time: 04/20/2021/11:46:57 AM    Final    ECHO TEE  Result Date: 05/15/2021    TRANSESOPHOGEAL ECHO REPORT   Patient Name:   Kirsten Mcmahon Date of Exam: 05/15/2021 Medical Rec #:  [id number]  Height:       62.0 in Accession #:    [id number]        Weight:       124.6 lb Date of Birth:  1946/06/06          BSA:          1.563 m Patient Age:    59 years          BP:           159/64 mmHg Patient Gender: F                  HR:           93 bpm. Exam Location:  Inpatient Procedure: 2D Echo, Color Doppler and Saline Contrast Bubble Study Indications:     Bacteremia. CVA.  History:         Patient has prior history of Echocardiogram examinations, most                  recent 04/20/2021.  Sonographer:     Philipp Deputy Referring Phys:  [id number] HAO MENG Diagnosing Phys: Candee Furbish MD PROCEDURE: After discussion of the risks and benefits of a TEE, an informed consent was obtained from the patient. The transesophogeal probe was passed without difficulty through the esophogus of the patient. Imaged were obtained with the patient in a left lateral decubitus position. Local oropharyngeal anesthetic was provided with viscous lidocaine. Sedation performed by different physician. Image quality was adequate. The patient developed no complications during the procedure. IMPRESSIONS  1. +PFO, positive bubble study.  2. Left ventricular ejection fraction, by estimation, is 65 to 70%. The left ventricle has normal function.  3. Right ventricular systolic function is normal. The right ventricular size is normal.  4. No left atrial/left atrial appendage thrombus was detected.  5. The mitral valve is normal in structure. No evidence of mitral valve regurgitation. No evidence of mitral stenosis.  6. The aortic valve is tricuspid. Aortic valve regurgitation is mild. Mild aortic valve sclerosis is present, with no evidence of aortic valve stenosis.  7. Evidence of atrial level shunting detected by color flow Doppler. Agitated saline contrast bubble study was positive with shunting observed within 3-6 cardiac cycles suggestive of interatrial shunt. There is a small patent foramen ovale with predominantly right to left shunting across the atrial septum. Conclusion(s)/Recommendation(s): No evidence of vegetation/infective endocarditis on this transesophageal echocardiogram. +PFO, positive bubble study.  FINDINGS  Left Ventricle: Left  ventricular ejection fraction, by estimation, is 65 to 70%. The left ventricle has normal function. The left ventricular internal cavity size was small. Right Ventricle: The right ventricular size is normal. No increase in right ventricular wall thickness. Right ventricular systolic function is normal. Left Atrium: Left atrial size was normal in size. No left atrial/left atrial appendage thrombus was detected. Right Atrium: Right atrial size was normal in size. Pericardium: There is no evidence of pericardial effusion. Mitral Valve: The mitral valve is normal in structure. No evidence of mitral valve regurgitation. No evidence of mitral valve stenosis. Tricuspid Valve: The tricuspid valve is normal in structure. Tricuspid valve regurgitation is mild . No evidence of tricuspid stenosis. Aortic Valve: The aortic valve is tricuspid. Aortic valve regurgitation is mild. Mild aortic valve sclerosis is present, with no evidence of aortic valve stenosis. Pulmonic Valve: The pulmonic valve was normal in structure. Pulmonic valve regurgitation is not visualized. Aorta: The aortic root was not well visualized. Venous: The pulmonary veins were not  well visualized. IAS/Shunts: Evidence of atrial level shunting detected by color flow Doppler. Agitated saline contrast was given intravenously to evaluate for intracardiac shunting. Agitated saline contrast bubble study was positive with shunting observed within 3-6 cardiac cycles suggestive of interatrial shunt. A small patent foramen ovale is detected with predominantly right to left shunting across the atrial septum. Additional Comments: +PFO, positive bubble study. Candee Furbish MD Electronically signed by Candee Furbish MD Signature Date/Time: 05/15/2021/12:24:43 PM    Final    VAS US CAROTID  Result Date: 05/15/2021 Carotid Arterial Duplex Study Patient Name:  Kirsten Mcmahon  Date of Exam:   05/14/2021 Medical Rec #: [id number]          Accession #:    [id number] Date of Birth:  [birth year]           Patient Gender: F Patient Age:   74Y Exam Location:  [facility] Procedure:      VAS US CAROTID Referring Phys: [id number] [place] --------------------------------------------------------------------------------  Indications:       CVA. Comparison Study:  No prior study Performing Technologist: Maudry Mayhew MHA, RDMS, RVT, RDCS  Examination Guidelines: A complete evaluation includes B-mode imaging, spectral Doppler, color Doppler, and power Doppler as needed of all accessible portions of each vessel. Bilateral testing is considered an integral part of a complete examination. Limited examinations for reoccurring indications may be performed as noted.  Right Carotid Findings: +----------+--------+--------+--------+------------------+--------+           PSV cm/sEDV cm/sStenosisPlaque DescriptionComments +----------+--------+--------+--------+------------------+--------+ CCA Prox  51      17                                         +----------+--------+--------+--------+------------------+--------+ CCA Distal43      10                                         +----------+--------+--------+--------+------------------+--------+ ICA Prox  42      14                                         +----------+--------+--------+--------+------------------+--------+ ECA       36      8                                          +----------+--------+--------+--------+------------------+--------+ +---------+--------+--------+---------+ VertebralPSV cm/sEDV cm/sAntegrade +---------+--------+--------+---------+  Left Carotid Findings: +----------+--------+--------+--------+-----------------------+--------+           PSV cm/sEDV cm/sStenosisPlaque Description     Comments +----------+--------+--------+--------+-----------------------+--------+ CCA Prox  41      11                                               +----------+--------+--------+--------+-----------------------+--------+ CCA Distal36      11                                              +----------+--------+--------+--------+-----------------------+--------+  ICA Prox  33      17                                              +----------+--------+--------+--------+-----------------------+--------+ ICA Distal44      12                                              +----------+--------+--------+--------+-----------------------+--------+ ECA       41      10              smooth and heterogenous         +----------+--------+--------+--------+-----------------------+--------+ +----------+--------+--------+----------------+-------------------+           PSV cm/sEDV cm/sDescribe        Arm Pressure (mmHG) +----------+--------+--------+----------------+-------------------+ [id number]              Multiphasic, WNL                    +----------+--------+--------+----------------+-------------------+ +---------+--------+--+--------+--+---------+ VertebralPSV cm/s52EDV cm/s18Antegrade +---------+--------+--+--------+--+---------+   Summary: Right Carotid: Velocities in the right ICA are consistent with a 1-39% stenosis. Left Carotid: Velocities in the left ICA are consistent with a 1-39% stenosis. Vertebrals:  Left vertebral artery demonstrates antegrade flow. Subclavians: Normal flow hemodynamics were seen in the left subclavian artery. *See table(s) above for measurements and observations.  Electronically signed by Antony Contras MD on 05/15/2021 at 11:57:13 AM.    Final      Discharge Exam: Vitals:   05/16/21 1112 05/16/21 1147  BP: (!) 143/95 (!) 150/95  Pulse: 98 93  Resp: 20 20  Temp: 98.4 F (36.9 C) 98.3 F (36.8 C)  SpO2: 98% 100%   Vitals:   05/16/21 1040 05/16/21 1100 05/16/21 1112 05/16/21 1147  BP: (!) 147/94 116/79 (!) 143/95 (!) 150/95  Pulse: (!) 102 (!) 105 98 93  Resp: (!) 24  20 20   Temp: 97.6  F (36.4 C)  98.4 F (36.9 C) 98.3 F (36.8 C)  TempSrc: Axillary  Axillary Axillary  SpO2:   98% 100%  Weight:      Height:        General: Pt is awake, not in acute distress Cardiovascular: RRR, S1/S2 +, no rubs, no gallops Respiratory: CTA bilaterally, no wheezing, no rhonchi Abdominal: Soft, NT, ND, bowel sounds + Extremities: +1-2 pitting edema bilateral lower extremity. Neuro: Dense hemiplegia on the left side.  Patient alert but oriented x1.    The results of significant diagnostics from this hospitalization (including imaging, microbiology, ancillary and laboratory) are listed below for reference.     Microbiology: No results found for this or any previous visit (from the past 240 hour(s)).   Labs: BNP (last 3 results) Recent Labs    04/09/21 1545  BNP 427.0*   Basic Metabolic Panel: Recent Labs  Lab 05/13/21 1416 05/15/21 1117 05/16/21 0714  NA 136 137 137  K 3.4* 4.1 4.0  CL 97* 101 101  CO2 28  --  29  GLUCOSE 197* 100* 104*  BUN 30* 27* 11  CREATININE 5.59* 5.40* 2.47*  CALCIUM 8.6*  --  7.8*  PHOS 3.6  --  1.7*   Liver Function Tests: Recent Labs  Lab 05/13/21 1416 05/16/21 0714  ALBUMIN 2.6* 2.6*  No results for input(s): LIPASE, AMYLASE in the last 168 hours. No results for input(s): AMMONIA in the last 168 hours. CBC: Recent Labs  Lab 05/13/21 1416 05/15/21 1117 05/16/21 0714  WBC 7.3  --  8.1  HGB 8.2* 7.5* 7.1*  HCT 26.5* 22.0* 23.8*  MCV 92.3  --  95.2  PLT 251  --  291   Cardiac Enzymes: No results for input(s): CKTOTAL, CKMB, CKMBINDEX, TROPONINI in the last 168 hours. BNP: Invalid input(s): POCBNP CBG: Recent Labs  Lab 05/10/21 1201 05/10/21 1718 05/11/21 1955 05/12/21 0742 05/14/21 1545  GLUCAP 107* 104* 144* 90 85   D-Dimer No results for input(s): DDIMER in the last 72 hours. Hgb A1c No results for input(s): HGBA1C in the last 72 hours. Lipid Profile No results for input(s): CHOL, HDL, LDLCALC, TRIG,  CHOLHDL, LDLDIRECT in the last 72 hours. Thyroid function studies No results for input(s): TSH, T4TOTAL, T3FREE, THYROIDAB in the last 72 hours.  Invalid input(s): FREET3 Anemia work up No results for input(s): VITAMINB12, FOLATE, FERRITIN, TIBC, IRON, RETICCTPCT in the last 72 hours. Urinalysis    Component Value Date/Time   COLORURINE YELLOW (A) 04/17/2021 1702   APPEARANCEUR HAZY (A) 04/17/2021 1702   LABSPEC 1.015 04/17/2021 1702   PHURINE 7.0 04/17/2021 1702   GLUCOSEU 150 (A) 04/17/2021 1702   HGBUR NEGATIVE 04/17/2021 1702   BILIRUBINUR NEGATIVE 04/17/2021 1702   KETONESUR 5 (A) 04/17/2021 1702   PROTEINUR >=300 (A) 04/17/2021 1702   NITRITE NEGATIVE 04/17/2021 1702   LEUKOCYTESUR NEGATIVE 04/17/2021 1702   Sepsis Labs Invalid input(s): PROCALCITONIN,  WBC,  LACTICIDVEN Microbiology No results found for this or any previous visit (from the past 240 hour(s)).   Time coordinating discharge: Over 30 minutes  SIGNED:   Darliss Cheney, MD  [facility] 05/16/2021, 11:57 AM  If 7PM-7AM, please contact night-coverage [email or web address]

## 2021-05-16 NOTE — TOC Progression Note (Signed)
Transition of Care [facility]) - Progression Note    Patient Details  Name: JAHNAVI MURATORE MRN: [id number] Date of Birth: 04-12-1946  Transition of Care [facility] CM/SW [place], LCSW Phone Number: 05/16/2021, 1:43 PM  Clinical Narrative:    CSW received call from [facility] provider, [name] [phone] requesting an update on when patient will be ready to discharge home. CSW explained that TEE is planned for today and patient will also need dialysis.   CSW received call from MD stating that patient can hopefully discharge tomorrow. CSW contacted [place] back and let her know of plan. [facility] will provide transport and all DME and medications.    Expected Discharge Plan: [place] Barriers to Discharge: Continued Medical Work up  Expected Discharge Plan and Services Expected Discharge Plan: [place]   Discharge Planning Services: CM Consult   Living arrangements for the past 2 months: Single Family Home Expected Discharge Date: 05/16/21                                     Social Determinants of Health (SDOH) Interventions    Readmission Risk Interventions Readmission Risk Prevention Plan 05/16/2021 04/11/2021  Transportation Screening Complete Complete  PCP or Specialist Appt within 3-5 Days - Complete  HRI or [place] - Complete  Social Work Consult for [place] Planning/Counseling - Complete  Palliative Care Screening - Not Applicable  Medication Review [employer] photographer) Complete Complete  PCP or Specialist appointment within 3-5 days of discharge Complete -  [place] or [place] Complete -  SW Recovery Care/Counseling Consult Complete -  Palliative Care Screening Complete -  [place] Patient Refused -  Some recent data might be hidden

## 2021-05-16 NOTE — Progress Notes (Signed)
PO4 1.7 today. ESRD with HD today. Consulted to replace phosphate. Will give 83mmol x1 today and recheck level in AM.   Onnie Boer, PharmD, BCIDP, AAHIVP, CPP Infectious Disease Pharmacist 05/16/2021 11:03 AM

## 2021-05-16 NOTE — Progress Notes (Signed)
Pt refusing all PO medications at this time. Shaking head and clamping lips shut. She spat her drink at me when she took a small sip. MD notified.

## 2021-05-16 NOTE — Care Management (Deleted)
Per adapt, hospital bed and WC will be delivered Monday.

## 2021-05-16 NOTE — Progress Notes (Signed)
Pt going to discharge via [facility] when they arrive. Pt's paperwork is ready, the son is aware. Pt still refusing to eat or take pills and more fatigued after HD. Pt arousable and combative when repositioned.

## 2021-05-17 ENCOUNTER — Inpatient Hospital Stay (HOSPITAL_COMMUNITY)
Admission: EM | Admit: 2021-05-17 | Discharge: 2021-05-24 | DRG: 871 | Disposition: A | Discharge status: Home / Self Care | Payer: Medicare (Managed Care) | Attending: Internal Medicine | Admitting: Internal Medicine

## 2021-05-17 ENCOUNTER — Inpatient Hospital Stay (HOSPITAL_COMMUNITY): Payer: Medicare (Managed Care)

## 2021-05-17 ENCOUNTER — Emergency Department (HOSPITAL_COMMUNITY): Payer: Medicare (Managed Care)

## 2021-05-17 DIAGNOSIS — Z7189 Other specified counseling: Secondary | ICD-10-CM | POA: Diagnosis not present

## 2021-05-17 DIAGNOSIS — I10 Essential (primary) hypertension: Secondary | ICD-10-CM | POA: Diagnosis present

## 2021-05-17 DIAGNOSIS — Z7982 Long term (current) use of aspirin: Secondary | ICD-10-CM

## 2021-05-17 DIAGNOSIS — Z515 Encounter for palliative care: Secondary | ICD-10-CM | POA: Diagnosis not present

## 2021-05-17 DIAGNOSIS — I5032 Chronic diastolic (congestive) heart failure: Secondary | ICD-10-CM | POA: Diagnosis present

## 2021-05-17 DIAGNOSIS — I132 Hypertensive heart and chronic kidney disease with heart failure and with stage 5 chronic kidney disease, or end stage renal disease: Secondary | ICD-10-CM | POA: Diagnosis present

## 2021-05-17 DIAGNOSIS — E890 Postprocedural hypothyroidism: Secondary | ICD-10-CM | POA: Diagnosis present

## 2021-05-17 DIAGNOSIS — N186 End stage renal disease: Secondary | ICD-10-CM | POA: Diagnosis present

## 2021-05-17 DIAGNOSIS — I6930 Unspecified sequelae of cerebral infarction: Secondary | ICD-10-CM

## 2021-05-17 DIAGNOSIS — L89302 Pressure ulcer of unspecified buttock, stage 2: Secondary | ICD-10-CM | POA: Diagnosis present

## 2021-05-17 DIAGNOSIS — Z91013 Allergy to seafood: Secondary | ICD-10-CM | POA: Diagnosis not present

## 2021-05-17 DIAGNOSIS — B955 Unspecified streptococcus as the cause of diseases classified elsewhere: Secondary | ICD-10-CM | POA: Diagnosis present

## 2021-05-17 DIAGNOSIS — N2581 Secondary hyperparathyroidism of renal origin: Secondary | ICD-10-CM | POA: Diagnosis present

## 2021-05-17 DIAGNOSIS — Z20822 Contact with and (suspected) exposure to covid-19: Secondary | ICD-10-CM | POA: Diagnosis present

## 2021-05-17 DIAGNOSIS — D72829 Elevated white blood cell count, unspecified: Secondary | ICD-10-CM | POA: Diagnosis present

## 2021-05-17 DIAGNOSIS — E039 Hypothyroidism, unspecified: Secondary | ICD-10-CM | POA: Diagnosis not present

## 2021-05-17 DIAGNOSIS — R7881 Bacteremia: Secondary | ICD-10-CM | POA: Diagnosis not present

## 2021-05-17 DIAGNOSIS — E7800 Pure hypercholesterolemia, unspecified: Secondary | ICD-10-CM | POA: Diagnosis present

## 2021-05-17 DIAGNOSIS — Z888 Allergy status to other drugs, medicaments and biological substances status: Secondary | ICD-10-CM | POA: Diagnosis not present

## 2021-05-17 DIAGNOSIS — Z90710 Acquired absence of both cervix and uterus: Secondary | ICD-10-CM

## 2021-05-17 DIAGNOSIS — D631 Anemia in chronic kidney disease: Secondary | ICD-10-CM | POA: Diagnosis present

## 2021-05-17 DIAGNOSIS — Z91040 Latex allergy status: Secondary | ICD-10-CM

## 2021-05-17 DIAGNOSIS — E860 Dehydration: Secondary | ICD-10-CM | POA: Diagnosis present

## 2021-05-17 DIAGNOSIS — G9341 Metabolic encephalopathy: Secondary | ICD-10-CM | POA: Diagnosis present

## 2021-05-17 DIAGNOSIS — E7220 Disorder of urea cycle metabolism, unspecified: Secondary | ICD-10-CM | POA: Diagnosis present

## 2021-05-17 DIAGNOSIS — D72825 Bandemia: Secondary | ICD-10-CM | POA: Diagnosis not present

## 2021-05-17 DIAGNOSIS — Z91041 Radiographic dye allergy status: Secondary | ICD-10-CM

## 2021-05-17 DIAGNOSIS — K219 Gastro-esophageal reflux disease without esophagitis: Secondary | ICD-10-CM | POA: Diagnosis present

## 2021-05-17 DIAGNOSIS — Z992 Dependence on renal dialysis: Secondary | ICD-10-CM | POA: Diagnosis not present

## 2021-05-17 DIAGNOSIS — H409 Unspecified glaucoma: Secondary | ICD-10-CM | POA: Diagnosis present

## 2021-05-17 DIAGNOSIS — Z8673 Personal history of transient ischemic attack (TIA), and cerebral infarction without residual deficits: Secondary | ICD-10-CM | POA: Diagnosis not present

## 2021-05-17 DIAGNOSIS — N184 Chronic kidney disease, stage 4 (severe): Secondary | ICD-10-CM

## 2021-05-17 DIAGNOSIS — Z961 Presence of intraocular lens: Secondary | ICD-10-CM | POA: Diagnosis present

## 2021-05-17 DIAGNOSIS — R4182 Altered mental status, unspecified: Secondary | ICD-10-CM

## 2021-05-17 DIAGNOSIS — E785 Hyperlipidemia, unspecified: Secondary | ICD-10-CM | POA: Diagnosis present

## 2021-05-17 DIAGNOSIS — G40909 Epilepsy, unspecified, not intractable, without status epilepticus: Secondary | ICD-10-CM | POA: Diagnosis present

## 2021-05-17 DIAGNOSIS — I69354 Hemiplegia and hemiparesis following cerebral infarction affecting left non-dominant side: Secondary | ICD-10-CM | POA: Diagnosis not present

## 2021-05-17 DIAGNOSIS — Z9842 Cataract extraction status, left eye: Secondary | ICD-10-CM

## 2021-05-17 DIAGNOSIS — E1122 Type 2 diabetes mellitus with diabetic chronic kidney disease: Secondary | ICD-10-CM | POA: Diagnosis present

## 2021-05-17 DIAGNOSIS — N3281 Overactive bladder: Secondary | ICD-10-CM | POA: Diagnosis present

## 2021-05-17 DIAGNOSIS — A419 Sepsis, unspecified organism: Principal | ICD-10-CM | POA: Diagnosis present

## 2021-05-17 DIAGNOSIS — K5731 Diverticulosis of large intestine without perforation or abscess with bleeding: Secondary | ICD-10-CM | POA: Diagnosis present

## 2021-05-17 DIAGNOSIS — J690 Pneumonitis due to inhalation of food and vomit: Secondary | ICD-10-CM | POA: Diagnosis present

## 2021-05-17 DIAGNOSIS — Z79899 Other long term (current) drug therapy: Secondary | ICD-10-CM

## 2021-05-17 DIAGNOSIS — Z79890 Hormone replacement therapy (postmenopausal): Secondary | ICD-10-CM

## 2021-05-17 LAB — TYPE AND SCREEN
ABO/RH(D): A NEG
Antibody Screen: NEGATIVE
Unit division: 0

## 2021-05-17 LAB — I-STAT VENOUS BLOOD GAS, ED
Acid-Base Excess: 5 mmol/L — ABNORMAL HIGH (ref 0.0–2.0)
Bicarbonate: 29 mmol/L — ABNORMAL HIGH (ref 20.0–28.0)
Calcium, Ion: 1.1 mmol/L — ABNORMAL LOW (ref 1.15–1.40)
HCT: 29 % — ABNORMAL LOW (ref 36.0–46.0)
Hemoglobin: 9.9 g/dL — ABNORMAL LOW (ref 12.0–15.0)
O2 Saturation: 66 %
Potassium: 4.5 mmol/L (ref 3.5–5.1)
Sodium: 141 mmol/L (ref 135–145)
TCO2: 30 mmol/L (ref 22–32)
pCO2, Ven: 39.4 mmHg — ABNORMAL LOW (ref 44.0–60.0)
pH, Ven: 7.475 — ABNORMAL HIGH (ref 7.250–7.430)
pO2, Ven: 32 mmHg (ref 32.0–45.0)

## 2021-05-17 LAB — CBC WITH DIFFERENTIAL/PLATELET
Abs Immature Granulocytes: 0.17 10*3/uL — ABNORMAL HIGH (ref 0.00–0.07)
Basophils Absolute: 0 10*3/uL (ref 0.0–0.1)
Basophils Relative: 0 %
Eosinophils Absolute: 0 10*3/uL (ref 0.0–0.5)
Eosinophils Relative: 0 %
HCT: 32.3 % — ABNORMAL LOW (ref 36.0–46.0)
Hemoglobin: 10.2 g/dL — ABNORMAL LOW (ref 12.0–15.0)
Immature Granulocytes: 1 %
Lymphocytes Relative: 3 %
Lymphs Abs: 0.7 10*3/uL (ref 0.7–4.0)
MCH: 29.2 pg (ref 26.0–34.0)
MCHC: 31.6 g/dL (ref 30.0–36.0)
MCV: 92.6 fL (ref 80.0–100.0)
Monocytes Absolute: 0.7 10*3/uL (ref 0.1–1.0)
Monocytes Relative: 3 %
Neutro Abs: 22 10*3/uL — ABNORMAL HIGH (ref 1.7–7.7)
Neutrophils Relative %: 93 %
Platelets: 202 10*3/uL (ref 150–400)
RBC: 3.49 MIL/uL — ABNORMAL LOW (ref 3.87–5.11)
RDW: 15 % (ref 11.5–15.5)
WBC: 23.6 10*3/uL — ABNORMAL HIGH (ref 4.0–10.5)
nRBC: 0 % (ref 0.0–0.2)

## 2021-05-17 LAB — URINALYSIS, ROUTINE W REFLEX MICROSCOPIC
Bilirubin Urine: NEGATIVE
Glucose, UA: 50 mg/dL — AB
Hgb urine dipstick: NEGATIVE
Ketones, ur: 5 mg/dL — AB
Leukocytes,Ua: NEGATIVE
Nitrite: NEGATIVE
Protein, ur: >=300 mg/dL — AB
Specific Gravity, Urine: 1.011 (ref 1.005–1.030)
pH: 9 — ABNORMAL HIGH (ref 5.0–8.0)

## 2021-05-17 LAB — CBG MONITORING, ED: Glucose-Capillary: 158 mg/dL — ABNORMAL HIGH (ref 70–99)

## 2021-05-17 LAB — COMPREHENSIVE METABOLIC PANEL
ALT: 16 U/L (ref 0–44)
AST: 28 U/L (ref 15–41)
Albumin: 3.5 g/dL (ref 3.5–5.0)
Alkaline Phosphatase: 87 U/L (ref 38–126)
Anion gap: 15 (ref 5–15)
BUN: 29 mg/dL — ABNORMAL HIGH (ref 8–23)
CO2: 24 mmol/L (ref 22–32)
Calcium: 9.3 mg/dL (ref 8.9–10.3)
Chloride: 100 mmol/L (ref 98–111)
Creatinine, Ser: 4.7 mg/dL — ABNORMAL HIGH (ref 0.44–1.00)
GFR, Estimated: 9 mL/min — ABNORMAL LOW (ref >60)
Glucose, Bld: 191 mg/dL — ABNORMAL HIGH (ref 70–99)
Potassium: 4.9 mmol/L (ref 3.5–5.1)
Sodium: 139 mmol/L (ref 135–145)
Total Bilirubin: 1.8 mg/dL — ABNORMAL HIGH (ref 0.3–1.2)
Total Protein: 7.9 g/dL (ref 6.5–8.1)

## 2021-05-17 LAB — AMMONIA: Ammonia: 37 umol/L — ABNORMAL HIGH (ref 9–35)

## 2021-05-17 LAB — RESP PANEL BY RT-PCR (FLU A&B, COVID) ARPGX2
Influenza A by PCR: NEGATIVE
Influenza B by PCR: NEGATIVE
SARS Coronavirus 2 by RT PCR: NEGATIVE

## 2021-05-17 LAB — LACTIC ACID, PLASMA
Lactic Acid, Venous: 1.8 mmol/L (ref 0.5–1.9)
Lactic Acid, Venous: 1.4 mmol/L (ref 0.5–1.9)

## 2021-05-17 LAB — SAVE SMEAR(SSMR), FOR PROVIDER SLIDE REVIEW

## 2021-05-17 LAB — BPAM RBC
Blood Product Expiration Date: 202206152359
ISSUE DATE / TIME: 202206111006
Unit Type and Rh: 600

## 2021-05-17 LAB — PHOSPHORUS: Phosphorus: 3.9 mg/dL (ref 2.5–4.6)

## 2021-05-17 LAB — GLUCOSE, CAPILLARY
Glucose-Capillary: 126 mg/dL — ABNORMAL HIGH (ref 70–99)
Glucose-Capillary: 146 mg/dL — ABNORMAL HIGH (ref 70–99)

## 2021-05-17 LAB — LIPASE, BLOOD: Lipase: 26 U/L (ref 11–51)

## 2021-05-17 IMAGING — CT CT ABD-PELV W/O CM
2 of 4 series · 16 of 46 positions shown, 18 images · non-contrast
Comparison: None.

CLINICAL DATA: Altered mental status. Intra-abdominal infection. On
antibiotics for bacteremia. End-stage renal disease. Vomiting.

EXAM:
CT ABDOMEN AND PELVIS WITHOUT CONTRAST
TECHNIQUE: Multidetector CT imaging of the abdomen and pelvis was performed
following the standard protocol without IV contrast.

[Series 3: a/p w/o 5mm · axial · non-contrast · 0.68mm/px · z∈[+829,+1219]mm · 13 of 86 slices shown, 15 images]
[im 4/86  soft-tissue]
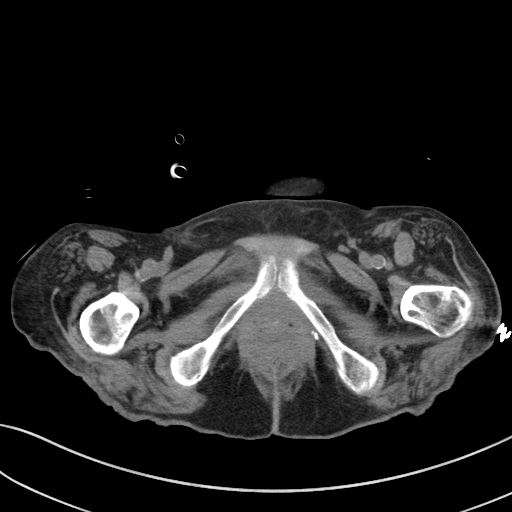
[im 4/86  bone]
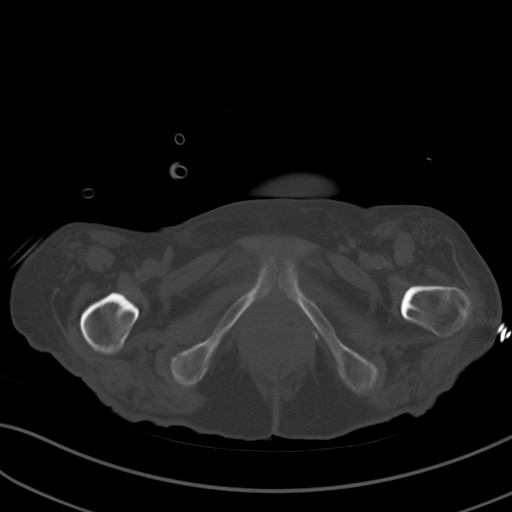
[im 11/86  soft-tissue]
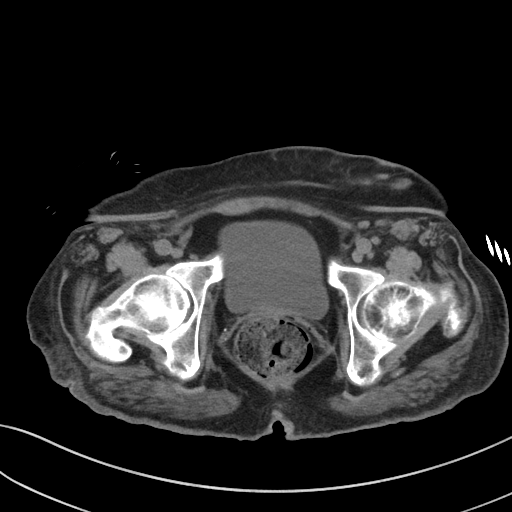
[im 18/86  soft-tissue]
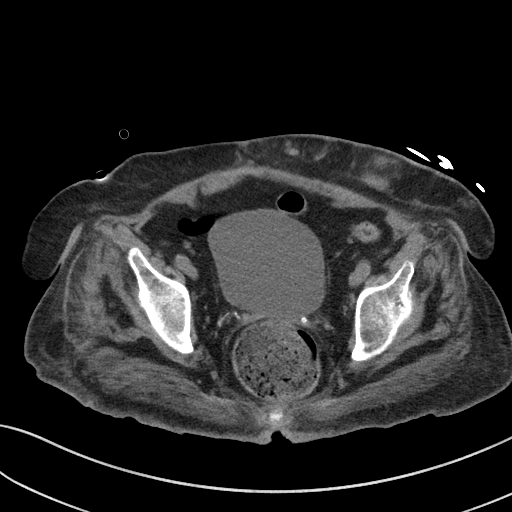
[im 24/86  soft-tissue]
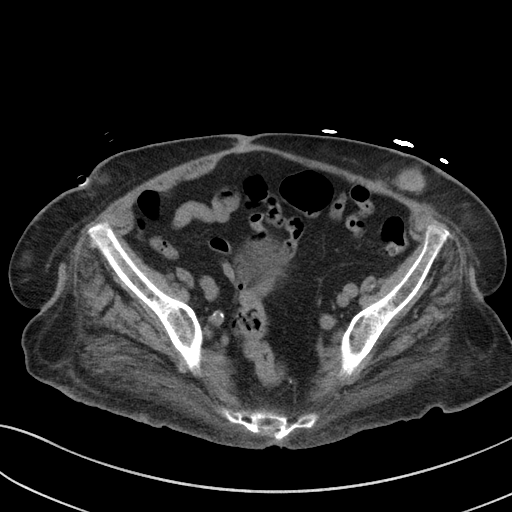
[im 31/86  soft-tissue]
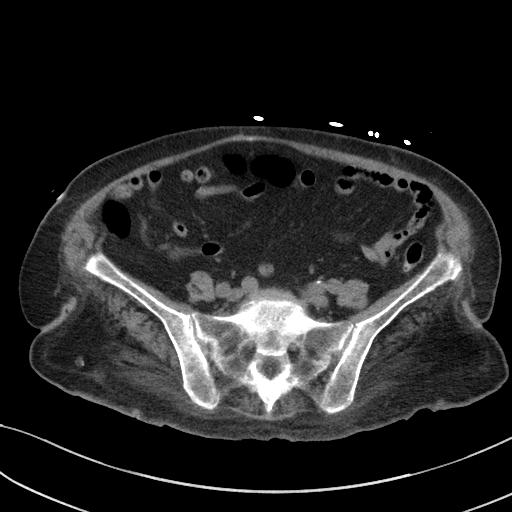
[im 38/86  soft-tissue]
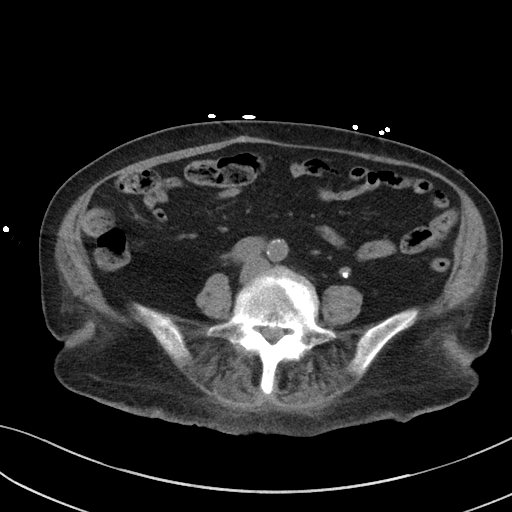
[im 45/86  soft-tissue]
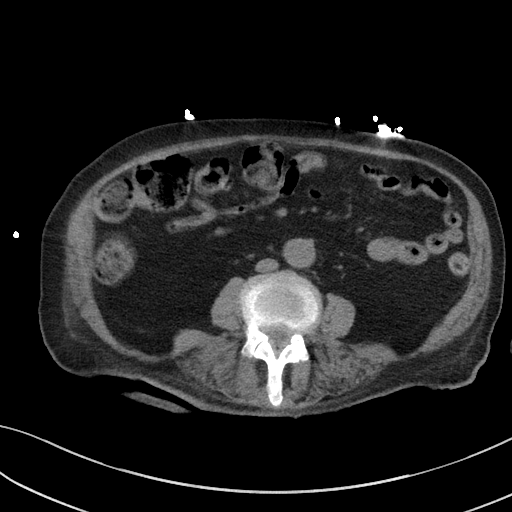
[im 48/86  soft-tissue]
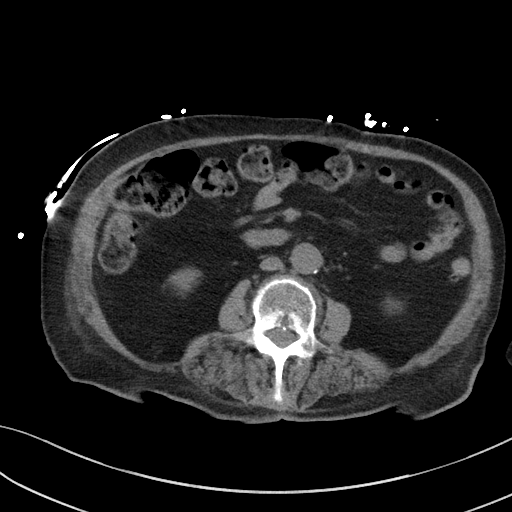
[im 55/86  soft-tissue]
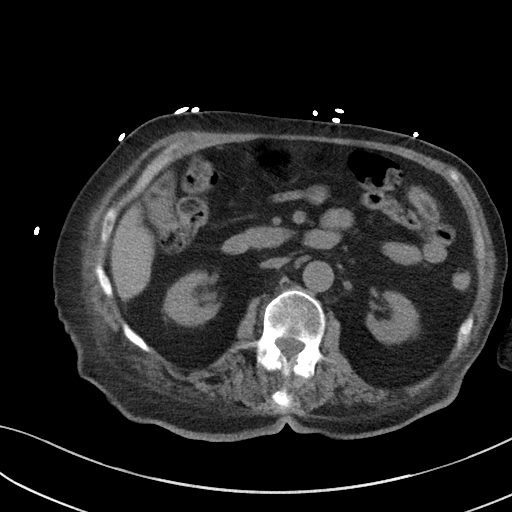
[im 55/86  bone]
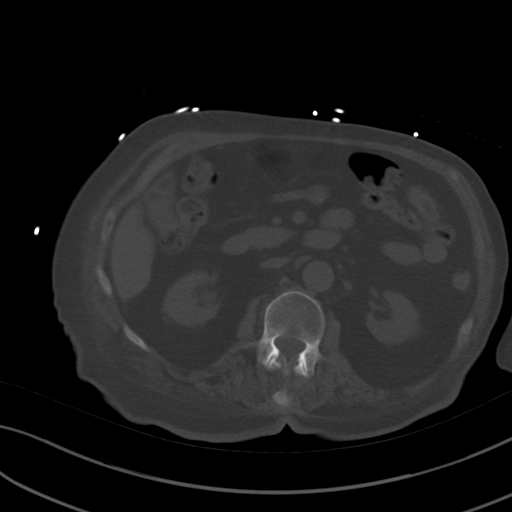
[im 62/86  soft-tissue]
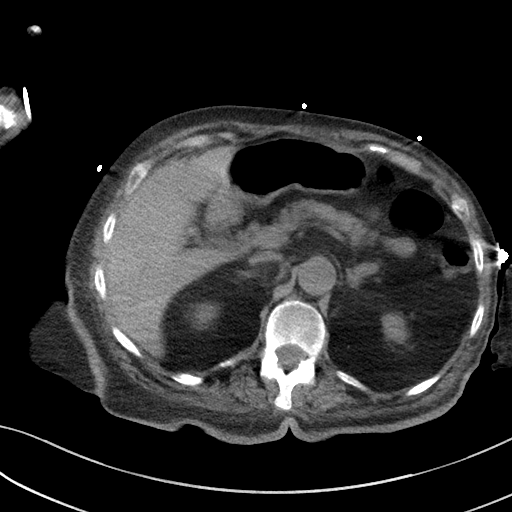
[im 69/86  soft-tissue]
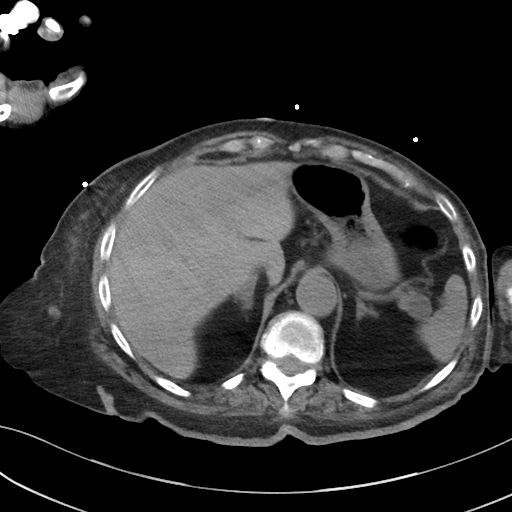
[im 75/86  soft-tissue]
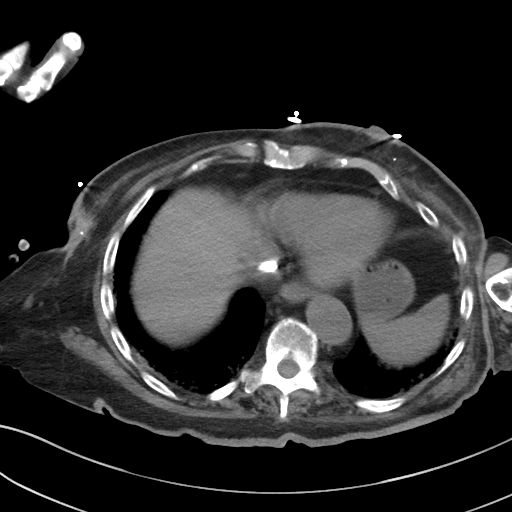
[im 82/86  soft-tissue]
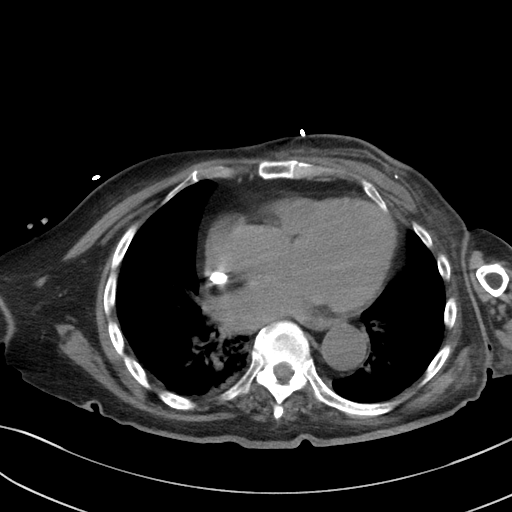

[Series 6: a/p w/o cor · coronal · non-contrast · 0.77mm/px · 3 of 135 slices shown]
[im 45/135  soft-tissue]
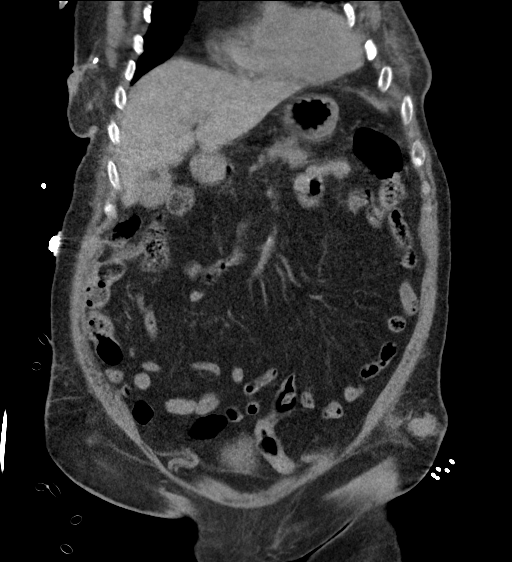
[im 60/135  soft-tissue]
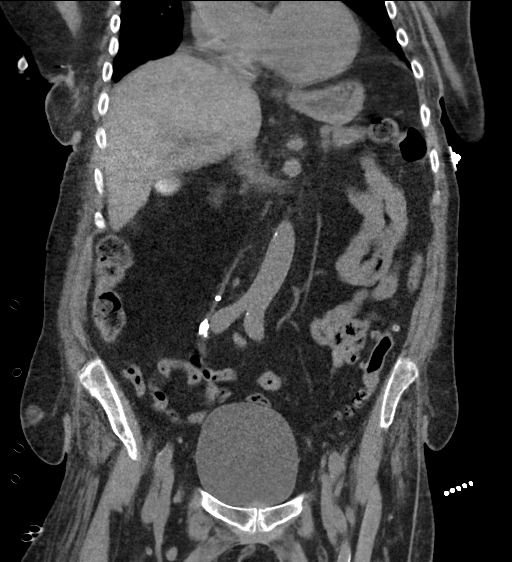
[im 75/135  soft-tissue]
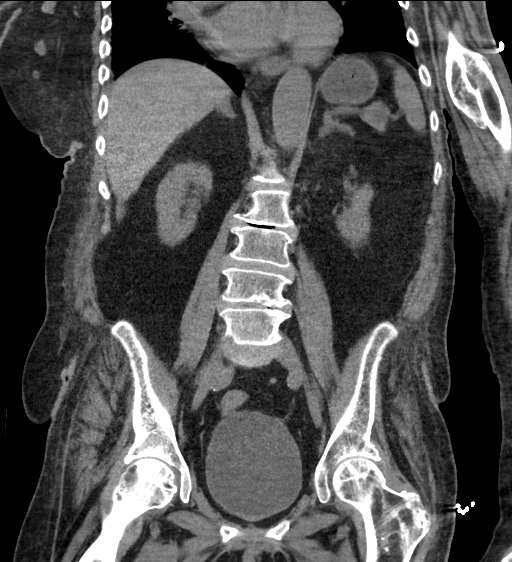

[16 of 46 positions shown; findings below may reference images not displayed]

FINDINGS: Lower chest: Right greater than left base patchy airspace disease.
Cardiomegaly. Large bore central line terminating at the low right
atrium.

Hepatobiliary: Multifactorial degradation, including patient arm
position and lack of oral or IV contrast. Grossly normal noncontrast
appearance of the liver. Multiple gallstones without acute
cholecystitis or biliary duct dilatation.

Pancreas: Normal, without mass or ductal dilatation.

Spleen: Normal in size, without focal abnormality.

Adrenals/Urinary Tract: Left greater than right adrenal thickening,
without dominant mass. Moderate bilateral renal cortical thinning.
Left renal vascular calcifications. An interpolar left renal 1.0 cm
low-density lesion is suspected, likely a cyst. No hydronephrosis.
No hydroureter or ureteric calculi. No bladder calculi.

Stomach/Bowel: Normal stomach, without wall thickening. Stool within
the rectum of 6.2 cm. No obstruction.

Scattered colonic diverticula. Normal terminal ileum and appendix.
Normal small bowel.

Vascular/Lymphatic: Aortic atherosclerosis. No abdominopelvic
adenopathy.

Reproductive: Hysterectomy.  No adnexal mass.

Other: No significant free fluid. No free intraperitoneal air. Mild
pelvic floor laxity. Multiple soft tissue density nodules within the
anterior pelvic wall are likely injection sites.

Musculoskeletal: Lumbosacral spondylosis.
IMPRESSION: 1. Multifactorial degradation, including patient arm position and
lack of oral or IV contrast.
2. Given this factor, no acute process in the abdomen or pelvis.
3. Cholelithiasis.
4. Right greater than left base patchy airspace disease, suspicious
for pneumonia.
5. Possible fecal impaction.
6. Aortic Atherosclerosis ([AN]-[AN]).

## 2021-05-17 IMAGING — CT CT HEAD W/O CM
4 of 5 series · 15 of 47 positions shown, 17 images · non-contrast
Comparison: [DATE]

CLINICAL DATA: Mental status change with unknown cause.

EXAM:
CT HEAD WITHOUT CONTRAST
TECHNIQUE: Contiguous axial images were obtained from the base of the skull
through the vertex without intravenous contrast.

[Series 3: head without · axial · non-contrast · 0.43mm/px · z∈[-62,+33]mm · 4 of 33 slices shown]
[im 7/33  brain]
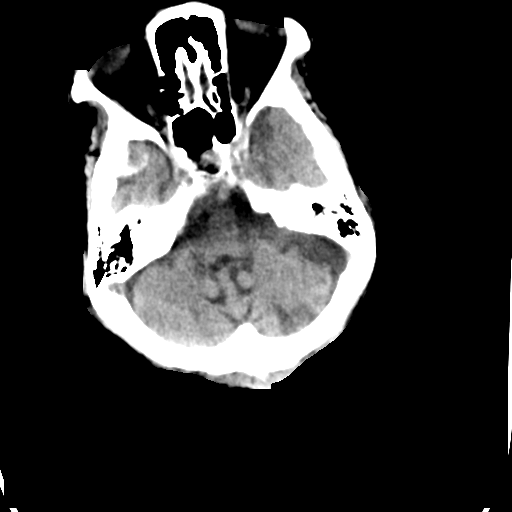
[im 13/33  brain]
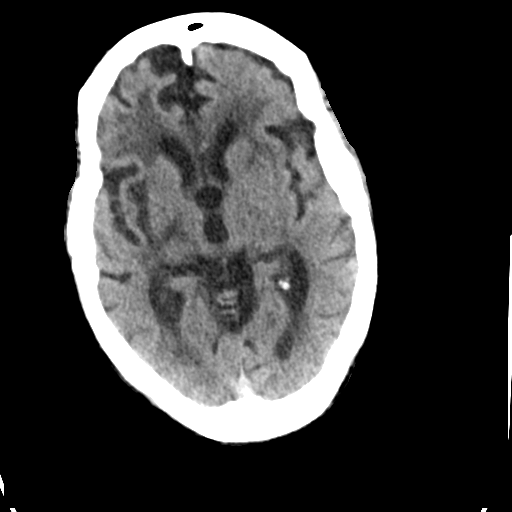
[im 20/33  brain]
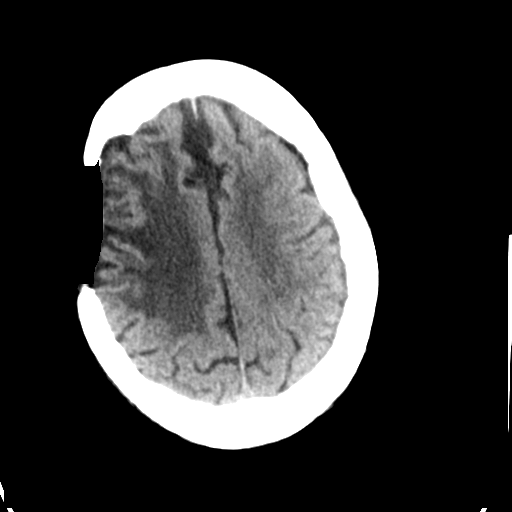
[im 26/33  brain]
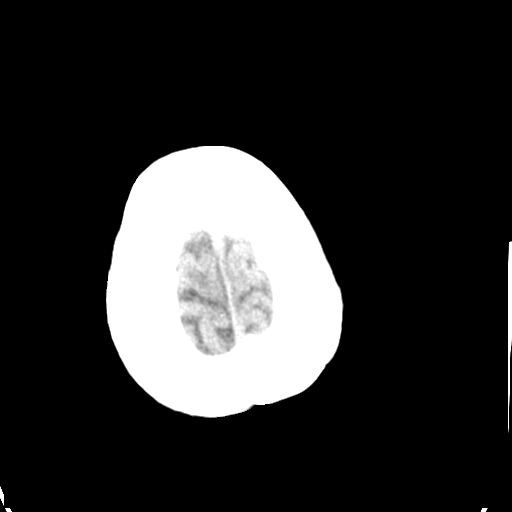

[Series 4: head without ax · axial · non-contrast · 0.36mm/px · z∈[-66,+34]mm · 5 of 33 slices shown, 7 images]
[im 6/33  brain]
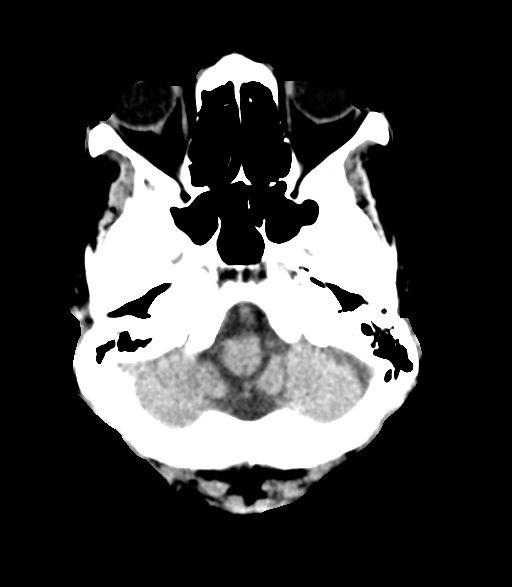
[im 6/33  bone]
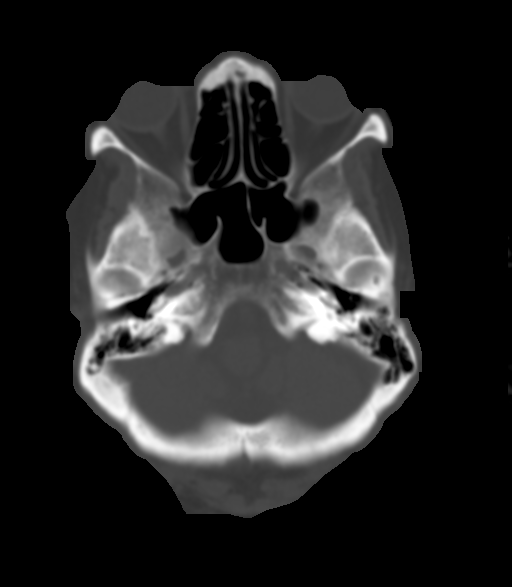
[im 11/33  brain]
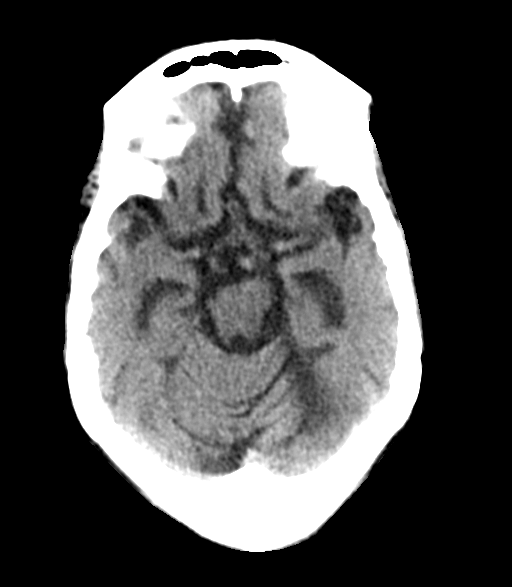
[im 17/33  brain]
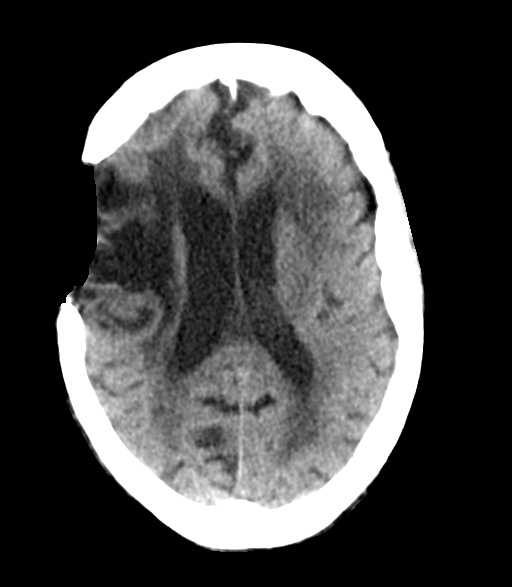
[im 22/33  brain]
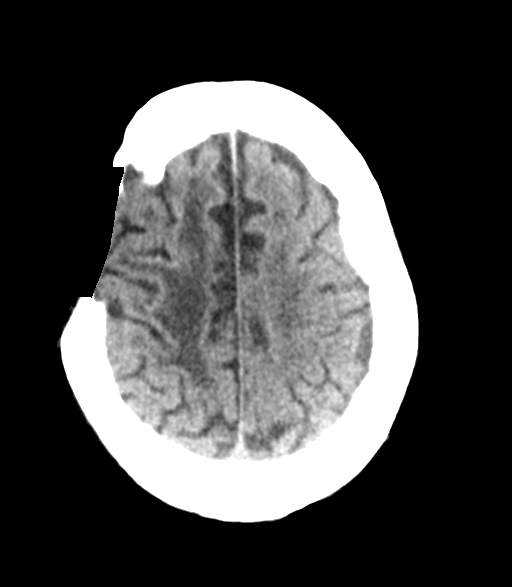
[im 27/33  brain]
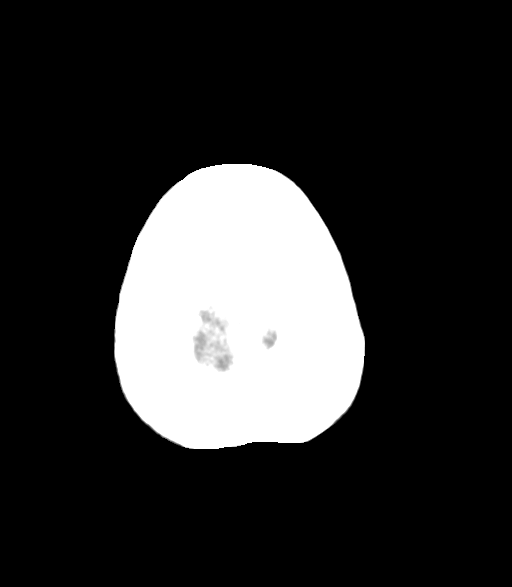
[im 27/33  bone]
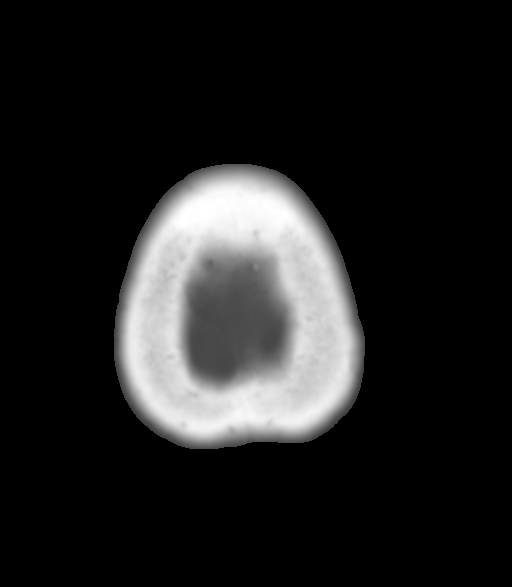

[Series 6: head without cor · coronal · non-contrast · 0.30mm/px · 3 of 65 slices shown]
[im 22/65  brain]
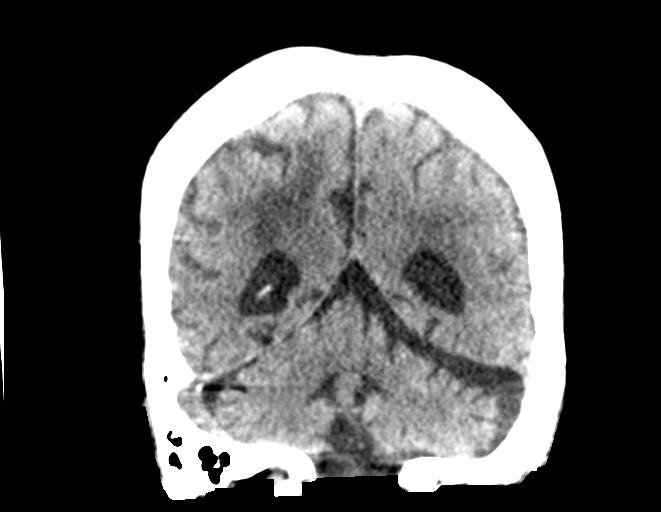
[im 29/65  brain]
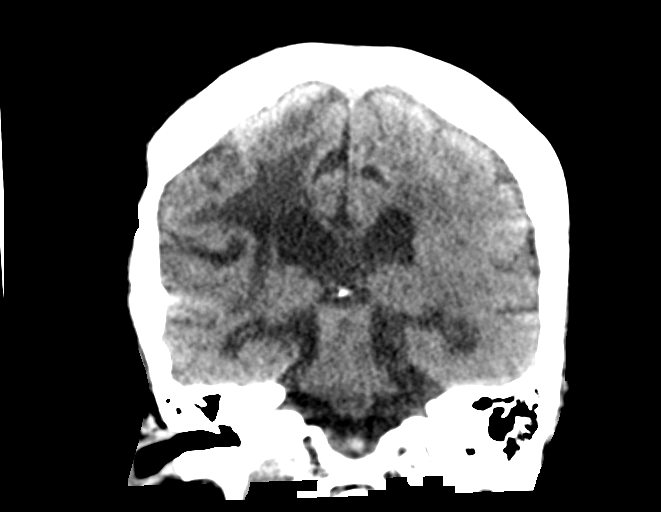
[im 36/65  brain]
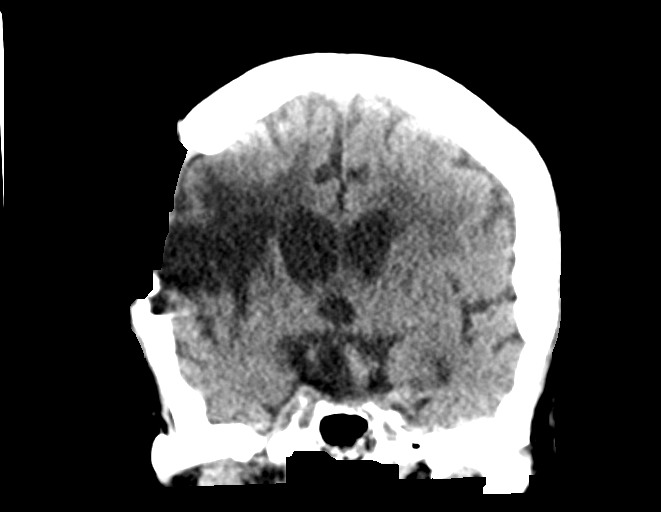

[Series 7: head without sag · sagittal · non-contrast · 0.30mm/px · 3 of 48 slices shown]
[im 16/48  brain]
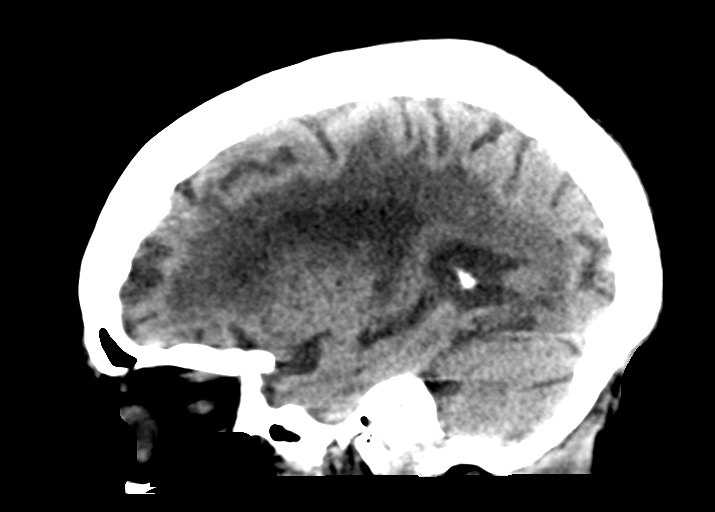
[im 24/48  brain]
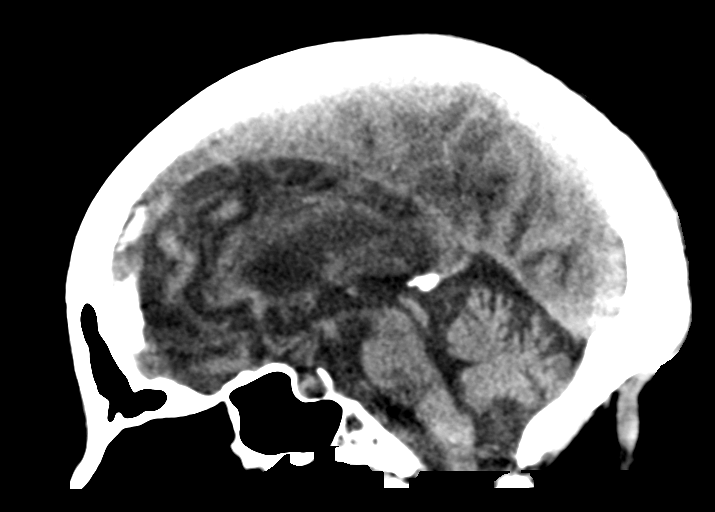
[im 32/48  brain]
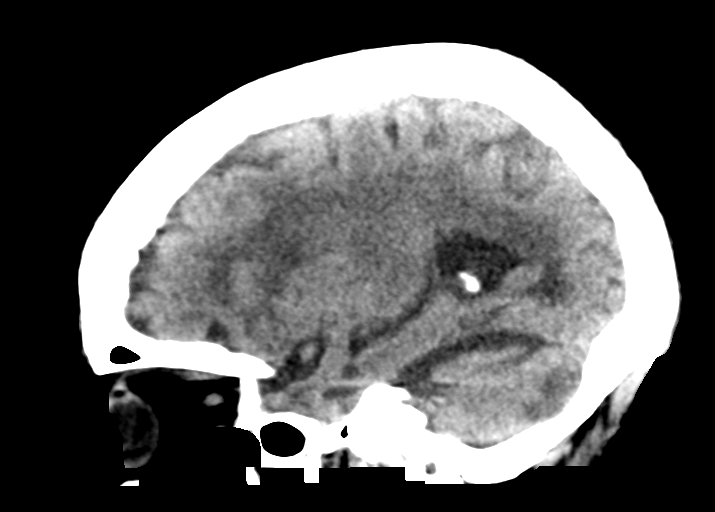

[15 of 47 positions shown; findings below may reference images not displayed]

FINDINGS: Brain: Patchy remote bilateral cerebellar infarction. Remote right
MCA distribution infarct with extensive gliosis and wallerian
degeneration seen into the brainstem. Confluent small-vessel
ischemic type gliosis in the cerebral white matter. No hemorrhage,
hydrocephalus, or masslike finding.

Vascular: No hyperdense vessel or unexpected calcification.

Skull: Right craniectomy, presumably for cytotoxic edema
decompression, with stable sunken appearance.

Sinuses/Orbits: Negative
IMPRESSION: 1. Stable compared to recent CT. Acute infarcts by brain MRI are
occult by CT.
2. Extensive chronic ischemic injury.

## 2021-05-17 IMAGING — DX DG CHEST 1V PORT
1 series · 1 of 1 positions shown · non-contrast
Comparison: [DATE]

CLINICAL DATA: Altered mental status.

EXAM:
PORTABLE CHEST 1 VIEW

[chest]
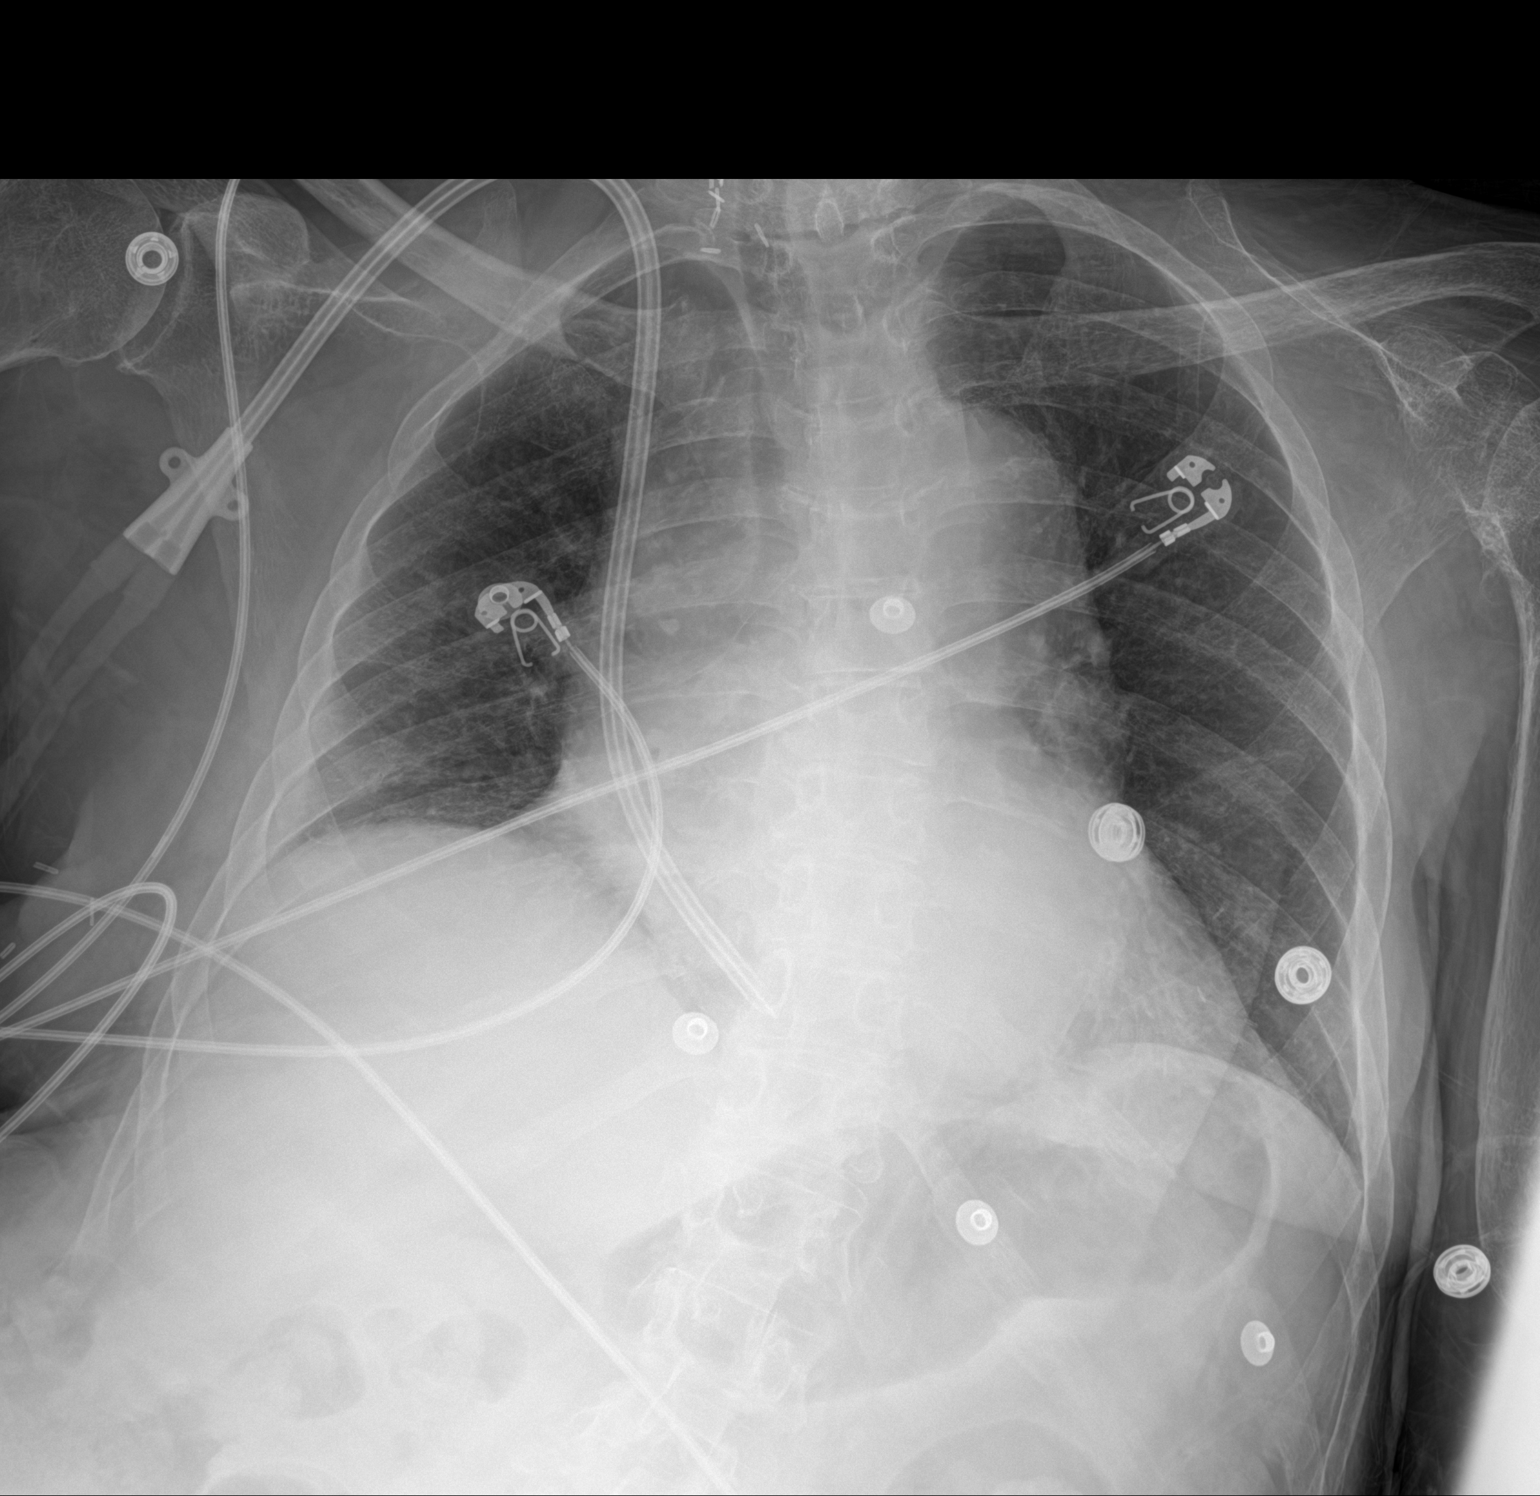

[1 of 1 positions shown; findings below may reference images not displayed]

FINDINGS: Right IJ dialysis catheter unchanged with tip over the right atrium.
Patient is slightly rotated to the right. Lungs are adequately
inflated and otherwise clear. Mild stable cardiomegaly. Remainder of
the exam is unchanged.
IMPRESSION: 1. No acute cardiopulmonary disease.
2. Mild stable cardiomegaly.

## 2021-05-17 MED ORDER — INSULIN ASPART 100 UNIT/ML IJ SOLN
0.0000 [IU] | INTRAMUSCULAR | Status: DC
  Administered 2021-05-20 – 2021-05-23 (×2): 1 [IU] via SUBCUTANEOUS

## 2021-05-17 MED ORDER — DARBEPOETIN ALFA 100 MCG/0.5ML IJ SOSY: 100.0000 ug | PREFILLED_SYRINGE | INTRAMUSCULAR | Status: DC

## 2021-05-17 MED ORDER — SODIUM CHLORIDE 0.9% FLUSH
3.0000 mL | Freq: Two times a day (BID) | INTRAVENOUS | Status: DC
  Administered 2021-05-18 – 2021-05-24 (×12): 3 mL via INTRAVENOUS

## 2021-05-17 MED ORDER — ACETAMINOPHEN 325 MG PO TABS: 650.0000 mg | ORAL_TABLET | Freq: Four times a day (QID) | ORAL | Status: DC | PRN

## 2021-05-17 MED ORDER — PIPERACILLIN-TAZOBACTAM IN DEX 2-0.25 GM/50ML IV SOLN
2.2500 g | Freq: Three times a day (TID) | INTRAVENOUS | Status: DC
  Administered 2021-05-18 – 2021-05-21 (×11): 2.25 g via INTRAVENOUS
  Filled 2021-05-17 (×12): qty 50

## 2021-05-17 MED ORDER — LEVETIRACETAM IN NACL 500 MG/100ML IV SOLN
500.0000 mg | Freq: Two times a day (BID) | INTRAVENOUS | Status: DC
  Administered 2021-05-17 – 2021-05-19 (×6): 500 mg via INTRAVENOUS
  Filled 2021-05-17 (×7): qty 100

## 2021-05-17 MED ORDER — LEVETIRACETAM IN NACL 500 MG/100ML IV SOLN
500.0000 mg | Freq: Every day | INTRAVENOUS | Status: DC
  Filled 2021-05-17: qty 100

## 2021-05-17 MED ORDER — PIPERACILLIN-TAZOBACTAM 3.375 G IVPB: 3.3750 g | Freq: Three times a day (TID) | INTRAVENOUS | Status: DC

## 2021-05-17 MED ORDER — ACETAMINOPHEN 650 MG RE SUPP: 650.0000 mg | Freq: Four times a day (QID) | RECTAL | Status: DC | PRN

## 2021-05-17 MED ORDER — PIPERACILLIN-TAZOBACTAM 3.375 G IVPB 30 MIN
3.3750 g | Freq: Once | INTRAVENOUS | Status: AC
  Administered 2021-05-17: 3.375 g via INTRAVENOUS
  Filled 2021-05-17: qty 50

## 2021-05-17 MED ORDER — CHLORHEXIDINE GLUCONATE CLOTH 2 % EX PADS
6.0000 | MEDICATED_PAD | Freq: Every day | CUTANEOUS | Status: DC
  Administered 2021-05-18 – 2021-05-24 (×7): 6 via TOPICAL

## 2021-05-17 MED ORDER — ALBUTEROL SULFATE (2.5 MG/3ML) 0.083% IN NEBU: 2.5000 mg | INHALATION_SOLUTION | Freq: Four times a day (QID) | RESPIRATORY_TRACT | Status: DC | PRN

## 2021-05-17 MED ORDER — HEPARIN SODIUM (PORCINE) 5000 UNIT/ML IJ SOLN
5000.0000 [IU] | Freq: Three times a day (TID) | INTRAMUSCULAR | Status: DC
  Administered 2021-05-17 – 2021-05-21 (×12): 5000 [IU] via SUBCUTANEOUS
  Filled 2021-05-17 (×13): qty 1

## 2021-05-17 MED ORDER — SODIUM CHLORIDE 0.9 % IV BOLUS
500.0000 mL | Freq: Once | INTRAVENOUS | Status: AC
Start: 2021-05-17 — End: 2021-05-17
  Administered 2021-05-17: 500 mL via INTRAVENOUS

## 2021-05-17 MED ORDER — DEXTROSE 50 % IV SOLN
1.0000 | INTRAVENOUS | Status: DC | PRN
  Administered 2021-05-21: 50 mL via INTRAVENOUS
  Filled 2021-05-17: qty 50

## 2021-05-17 MED ORDER — LEVETIRACETAM IN NACL 500 MG/100ML IV SOLN: 500.0000 mg | Freq: Two times a day (BID) | INTRAVENOUS | Status: DC

## 2021-05-17 MED ORDER — SODIUM CHLORIDE 0.9 % IV SOLN: INTRAVENOUS | Status: DC

## 2021-05-17 NOTE — ED Notes (Signed)
Son called and I spoke with him regarding his mother.  I explained to him that it would be best if he came in and spoke with the physician or PA. He states he plans on coming in in a while.

## 2021-05-17 NOTE — ED Notes (Signed)
HD cath dressing changed as it had been soiled with vomit. Appropriate dressing changed using sterile technique.

## 2021-05-17 NOTE — Progress Notes (Signed)
Spoke with RN Gwyndolyn Saxon about [facility] dsg change, RN stated she would reach out to charge nurse to assess with dsg change.

## 2021-05-17 NOTE — ED Notes (Signed)
Tele-tracking entered 

## 2021-05-17 NOTE — ED Triage Notes (Signed)
Pt arrived via [facility] from home for cc of AMS and vomiting. Pt was discharged from [facility] yesterday at new baseline of A&Ox0, [per family pt was A&Ox4 prior to previous admission. EMS was called to residence for new complain of vomiting and family concern for altered mental status.  EMS report GCS 10 with periods of apnea lasting approximately 10 seconds.  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   EMS vitals: 176/100 HR 120 SPO2 97% RA RR 5-30 ETCO2 18 CBG 249

## 2021-05-17 NOTE — Progress Notes (Signed)
[facility] Progress Note   Subjective:   Patient seen and examined at bedside.  No family present.  Turns head very slowly to her name and occasional moaning but otherwise not responding.  Eyes remained slightly open during exam.  Per chart review family called EMS this morning due to AMS and vomiting.  Recent admission for encephalopathy of unclear origin and acute stroke, discharged last evening.  Similar mental status as when she was seen on dialysis yesterday. Pertinent findings in the ED include tachycardia, tachypnea, hypertension, negative respiratory panel, WBC 23.6, CT head with no acute findings, CXR with no acute cardiopulmonary disease, CT abd with patchy airspace disease R>L lung suspicious for PNA and possible fecal impaction. Patient admitted for further evaluation and management.  Objective Vitals:   05/17/21 1315 05/17/21 1345 05/17/21 1415 05/17/21 1500  BP: (!) 178/99 (!) 155/97 (!) 152/99 (!) 174/106  Pulse: (!) 120 (!) 114 (!) 114 (!) 106  Resp: (!) 25 (!) 25 (!) 22 (!) 21  Temp:      TempSrc:      SpO2: 100% 100% 99% 100%   Physical Exam General:chronically ill appearing female in NAD, altered, not following commands  Heart:tachycardia, irregular rhythm Lungs:+rhonchi on R, hard to appreciate due to patient AMS, nml WOB on RA Abdomen:soft, NTND Extremities:trace LE edema Dialysis Access: [facility] c/d/i   There were no vitals filed for this visit.  Intake/Output Summary (Last 24 hours) at 05/17/2021 1616 Last data filed at 05/17/2021 1355 Gross per 24 hour  Intake 600 ml  Output --  Net 600 ml    Additional Objective Labs: Basic Metabolic Panel: Recent Labs  Lab 05/13/21 1416 05/15/21 1117 05/16/21 0714 05/17/21 0808 05/17/21 0919 05/17/21 1146  NA 136 137 137 139  --  141  K 3.4* 4.1 4.0 4.9  --  4.5  CL 97* 101 101 100  --   --   CO2 28  --  29 24  --   --   GLUCOSE 197* 100* 104* 191*  --   --   BUN 30* 27* 11 29*  --   --    CREATININE 5.59* 5.40* 2.47* 4.70*  --   --   CALCIUM 8.6*  --  7.8* 9.3  --   --   PHOS 3.6  --  1.7*  --  3.9  --    Liver Function Tests: Recent Labs  Lab 05/13/21 1416 05/16/21 0714 05/17/21 0808  AST  --   --  28  ALT  --   --  16  ALKPHOS  --   --  87  BILITOT  --   --  1.8*  PROT  --   --  7.9  ALBUMIN 2.6* 2.6* 3.5   Recent Labs  Lab 05/17/21 0808  LIPASE 26   CBC: Recent Labs  Lab 05/13/21 1416 05/15/21 1117 05/16/21 0714 05/17/21 0808 05/17/21 1146  WBC 7.3  --  8.1 23.6*  --   NEUTROABS  --   --   --  22.0*  --   HGB 8.2*   < > 7.1* 10.2* 9.9*  HCT 26.5*   < > 23.8* 32.3* 29.0*  MCV 92.3  --  95.2 92.6  --   PLT 251  --  291 202  --    < > = values in this interval not displayed.    CBG: Recent Labs  Lab 05/10/21 1718 05/11/21 1955 05/12/21 0742 05/14/21 1545  GLUCAP 104* 144*  90 85   Studies/Results: CT ABDOMEN PELVIS WO CONTRAST  Result Date: 05/17/2021 CLINICAL DATA:  Altered mental status. Intra-abdominal infection. On antibiotics for bacteremia. End-stage renal disease. Vomiting. EXAM: CT ABDOMEN AND PELVIS WITHOUT CONTRAST TECHNIQUE: Multidetector CT imaging of the abdomen and pelvis was performed following the standard protocol without IV contrast. COMPARISON:  None. FINDINGS: Lower chest: Right greater than left base patchy airspace disease. Cardiomegaly. Large bore central line terminating at the low right atrium. Hepatobiliary: Multifactorial degradation, including patient arm position and lack of oral or IV contrast. Grossly normal noncontrast appearance of the liver. Multiple gallstones without acute cholecystitis or biliary duct dilatation. Pancreas: Normal, without mass or ductal dilatation. Spleen: Normal in size, without focal abnormality. Adrenals/Urinary Tract: Left greater than right adrenal thickening, without dominant mass. Moderate bilateral renal cortical thinning. Left renal vascular calcifications. An interpolar left renal 1.0  cm low-density lesion is suspected, likely a cyst. No hydronephrosis. No hydroureter or ureteric calculi. No bladder calculi. Stomach/Bowel: Normal stomach, without wall thickening. Stool within the rectum of 6.2 cm. No obstruction. Scattered colonic diverticula. Normal terminal ileum and appendix. Normal small bowel. Vascular/Lymphatic: Aortic atherosclerosis. No abdominopelvic adenopathy. Reproductive: Hysterectomy.  No adnexal mass. Other: No significant free fluid. No free intraperitoneal air. Mild pelvic floor laxity. Multiple soft tissue density nodules within the anterior pelvic wall are likely injection sites. Musculoskeletal: Lumbosacral spondylosis. IMPRESSION: 1. Multifactorial degradation, including patient arm position and lack of oral or IV contrast. 2. Given this factor, no acute process in the abdomen or pelvis. 3. Cholelithiasis. 4. Right greater than left base patchy airspace disease, suspicious for pneumonia. 5. Possible fecal impaction. 6. Aortic Atherosclerosis (ICD10-I70.0). Electronically Signed   By: Abigail Miyamoto M.D.   On: 05/17/2021 14:47   CT Head Wo Contrast  Result Date: 05/17/2021 CLINICAL DATA:  Mental status change with unknown cause. EXAM: CT HEAD WITHOUT CONTRAST TECHNIQUE: Contiguous axial images were obtained from the base of the skull through the vertex without intravenous contrast. COMPARISON:  05/07/2021 FINDINGS: Brain: Patchy remote bilateral cerebellar infarction. Remote right MCA distribution infarct with extensive gliosis and wallerian degeneration seen into the brainstem. Confluent small-vessel ischemic type gliosis in the cerebral white matter. No hemorrhage, hydrocephalus, or masslike finding. Vascular: No hyperdense vessel or unexpected calcification. Skull: Right craniectomy, presumably for cytotoxic edema decompression, with stable sunken appearance. Sinuses/Orbits: Negative IMPRESSION: 1. Stable compared to recent CT. Acute infarcts by brain MRI are occult by  CT. 2. Extensive chronic ischemic injury. Electronically Signed   By: Monte Fantasia M.D.   On: 05/17/2021 09:25   DG Chest Port 1 View  Result Date: 05/17/2021 CLINICAL DATA:  Altered mental status. EXAM: PORTABLE CHEST 1 VIEW COMPARISON:  05/04/2021 FINDINGS: Right IJ dialysis catheter unchanged with tip over the right atrium. Patient is slightly rotated to the right. Lungs are adequately inflated and otherwise clear. Mild stable cardiomegaly. Remainder of the exam is unchanged. IMPRESSION: 1. No acute cardiopulmonary disease. 2. Mild stable cardiomegaly. Electronically Signed   By: Marin Olp M.D.   On: 05/17/2021 08:43   VAS [place] LOWER EXTREMITY VENOUS (DVT)  Result Date: 05/16/2021  Lower Venous DVT Study Patient Name:  SHARIFAH CHAMPINE  Date of Exam:   05/16/2021 Medical Rec #: [id number]          Accession #:    [id number] Date of Birth: Nov 30, 1946           Patient Gender: F Patient Age:   74Y Exam Location:  [facility]  Procedure:      VAS [place] LOWER EXTREMITY VENOUS (DVT) Referring Phys: [id number] PRAMOD S SETHI --------------------------------------------------------------------------------  Indications: Stroke, and Positive PFO by TEE.  Limitations: Patient in reclining chair. Altered mental status. Comparison Study: Prior negative Left LEV done 04/10/21 and 12/26/16, are available                   for comparison Performing Technologist: Sharion Dove RVS  Examination Guidelines: A complete evaluation includes B-mode imaging, spectral Doppler, color Doppler, and power Doppler as needed of all accessible portions of each vessel. Bilateral testing is considered an integral part of a complete examination. Limited examinations for reoccurring indications may be performed as noted. The reflux portion of the exam is performed with the patient in reverse Trendelenburg.  +---------+---------------+---------+-----------+----------+-------------------+ RIGHT     CompressibilityPhasicitySpontaneityPropertiesThrombus Aging      +---------+---------------+---------+-----------+----------+-------------------+ CFV      Full           Yes      Yes                                      +---------+---------------+---------+-----------+----------+-------------------+ SFJ      Full                                                             +---------+---------------+---------+-----------+----------+-------------------+ FV Prox  Full                                                             +---------+---------------+---------+-----------+----------+-------------------+ FV Mid   Full                                                             +---------+---------------+---------+-----------+----------+-------------------+ FV DistalFull                                                             +---------+---------------+---------+-----------+----------+-------------------+ PFV                     Yes      Yes                                      +---------+---------------+---------+-----------+----------+-------------------+ POP      Full           Yes      Yes                                      +---------+---------------+---------+-----------+----------+-------------------+  PTV      Full                                                             +---------+---------------+---------+-----------+----------+-------------------+ PERO                                                  Not well visualized +---------+---------------+---------+-----------+----------+-------------------+   +---------+---------------+---------+-----------+----------+-------------------+ LEFT     CompressibilityPhasicitySpontaneityPropertiesThrombus Aging      +---------+---------------+---------+-----------+----------+-------------------+ CFV                     Yes      Yes                  patent by color and                                                        Doppler             +---------+---------------+---------+-----------+----------+-------------------+ FV Prox                 Yes      Yes                  patent by color and                                                       Doppler             +---------+---------------+---------+-----------+----------+-------------------+ FV Mid                  Yes      Yes                  patent by color and                                                       Doppler             +---------+---------------+---------+-----------+----------+-------------------+ FV Distal               Yes      Yes                  patent by color and                                                       Doppler             +---------+---------------+---------+-----------+----------+-------------------+ PFV  Not well visualized +---------+---------------+---------+-----------+----------+-------------------+ POP                     Yes      Yes                  patent by color and                                                       Doppler             +---------+---------------+---------+-----------+----------+-------------------+ PTV                                                   Not well visualized +---------+---------------+---------+-----------+----------+-------------------+ PERO                                                  Not well visualized +---------+---------------+---------+-----------+----------+-------------------+     Summary: RIGHT: - There is no evidence of deep vein thrombosis in the lower extremity. However, portions of this examination were limited- see technologist comments above.  LEFT: - There is no evidence of deep vein thrombosis in the lower extremity. However, portions of this examination were limited- see technologist comments above.   *See table(s) above for measurements and observations. Electronically signed by Jamelle Haring on 05/16/2021 at 7:17:31 PM.    Final     Medications:  levETIRAcetam Stopped (05/17/21 1355)   piperacillin-tazobactam (ZOSYN)  IV      [START ON 05/18/2021] Chlorhexidine Gluconate Cloth  6 each Topical Q0600   heparin  5,000 Units Subcutaneous Q8H   sodium chloride flush  3 mL Intravenous Q12H    Dialysis Orders: MWF  - GOC  4hrs, BFR 400, DFR 500,  EDW 55kg, 2K/ 2.5Ca   Access: TDC  Heparin none Mircera 27mcg q2wks - not given, to start 6/13  Assessment/Plan: 1. AMS - previous admission for same. Etiology unclear, likely multifactorial.  Recent acute CVA which neurology did not think was origin.  CT with possible PNA. WBC 23.6. Recent bacteremia with no evidence of vegetations on TEE.  Continue GOC discussion with family would be beneficial.  2. Possible PNA - CT with air space disease, ?aspiration, family reported vomiting overnight. ABX started. 3. Leukocytosis - 23.6. CT with possible PNA.  N/v overnight.  On vanc/zosyn 4. Nausea/vomiting - per family vomiting over night. Work up per primary.  5. ESRD - on HD MWF.  Orders written for HD tomorrow per regular schedule. Requires sitter at outpatient dialysis due to behavior/safety issues.  6. Anemia of CKD- Hgb 10.2. ESA due tomorrow. Will order.  1 unit pRBC given last admission with HD 6/11. Consider ruling out GIB.  7. Secondary hyperparathyroidism - Ca and phos in goal.  Not on VDRA or binders.  8. HTN/volume - Blood pressure currently elevated. Has had hypotension on HD.  Previously using midodrine and albumin for BP support. Will hold midodrine for now. UF as tolerated.  9. Nutrition - Renal diet with fluid restrictions once advanced.  10. Sterptococcal bacteremia -  noted on previous admission.  On Vanc with HD until 6/27.  TEE on 6/10 with no vegetations, +PFO.  11. Seizure disorder - on keppra 12. DMT2 - per primary  Jen Mow, PA-C [place] [facility] 05/17/2021,4:16 PM  LOS: 0 days

## 2021-05-17 NOTE — ED Notes (Signed)
Pt noted to have large duoderm type patch on sacral/buttocks area, patch left in place

## 2021-05-17 NOTE — ED Provider Notes (Signed)
[facility] EMERGENCY DEPARTMENT Provider Note   CSN: [id number] Arrival date & time: 05/17/21  8127     History Chief Complaint  Patient presents with   Altered Mental Status    Kirsten Mcmahon is a 75 y.o. female.  HPI  75 year old female past medical history of bacteremia on antibiotics, ESRD/HD every MWF, CVA with left-sided hemiparesis, HTN, HLD, DM, waxing and waning encephalopathy presents the emergency department concern for altered mental status and episode of vomiting.  Patient was just discharged yesterday from admission for altered mental status/encephalopathy.  She was found to have new small acute strokes but this was not thought to contribute to her change in mental status.  There did not appear to be an acute finding in regards to her change in mental status, palliative care consulted, family has decided to keep her full code and took her home yesterday.  Report from the daughter is that she was still "out of it" this morning and had an episode of vomiting so the son who is caring for her called an ambulance to have her brought back in.  Patient is altered, protecting her airway, sometimes moans and localizes when you call her name but otherwise is completely noncontributory.  Past Medical History:  Diagnosis Date   Cancer [facility]) 2017   Right breast   Depression    GERD (gastroesophageal reflux disease)    Glaucoma    Hemiparesis (HCC)    left side   High cholesterol    History of seizure    x 1 - after a spider bite   History of stroke with residual deficit    left-side weakness   Hypertension    states BP under control with meds., has been on med. x 2 yr.   Hypothyroidism    Non-insulin dependent type 2 diabetes mellitus ([place])    Overactive bladder    Stroke ([place])    1998 weakness on left side    Patient Active Problem List   Diagnosis Date Noted   Cerebral embolism with cerebral infarction 05/13/2021   Physical deconditioning  04/30/2021   Generalized muscle weakness 04/30/2021   Pressure injury of skin 04/29/2021   AKI (acute kidney injury) ([place]) 04/28/2021   Streptococcal sepsis, unspecified ([place]) 04/18/2021   Streptococcal bacteremia 04/18/2021   Leukocytosis 04/17/2021   Fever 04/17/2021   ESRD (end stage renal disease) ([place])    Acute kidney injury superimposed on CKD ([place]) 04/09/2021   ARF (acute renal failure) ([place]) 01/18/2021   Fall 01/17/2021   Hypertension    Hypothyroidism    Stroke ([place])    GERD (gastroesophageal reflux disease)    Anemia in chronic kidney disease (CKD)    Acute metabolic encephalopathy    Acute renal failure superimposed on stage 3b chronic kidney disease ([place])    Type II diabetes mellitus with renal manifestations (HCC)    Abnormal mammogram of left breast 07/30/2018   Closed displaced oblique fracture of shaft of left humerus 03/14/2018   Osteopenia 01/20/2018   Multiple thyroid nodules 07/13/2017   Tracheal deviation 07/13/2017   Malignant neoplasm of upper-outer quadrant of right breast in female, estrogen receptor positive ([place]) 01/20/2017   Primary vulvar squamous cell carcinoma ([place]) 01/20/2017    Past Surgical History:  Procedure Laterality Date   ABDOMINAL HYSTERECTOMY     complete   BREAST BIOPSY Left 08/03/2018   Benign adipose tissue   BREAST EXCISIONAL BIOPSY Right 2014   Positive   BREAST LUMPECTOMY Right  BUBBLE STUDY  05/15/2021   Procedure: BUBBLE STUDY;  Surgeon: Jerline Pain, MD;  Location: [facility] ENDOSCOPY;  Service: Cardiovascular;;   CATARACT EXTRACTION W/ INTRAOCULAR LENS IMPLANT Left    CEREBRAL ANEURYSM REPAIR  1998   DIALYSIS/PERMA CATHETER INSERTION N/A 04/10/2021   Procedure: DIALYSIS/PERMA CATHETER INSERTION;  Surgeon: Algernon Huxley, MD;  Location: [place] CV LAB;  Service: Cardiovascular;  Laterality: N/A;   IR FLUORO GUIDE CV LINE RIGHT  04/30/2021   PICC LINE INSERTION     TEE WITHOUT CARDIOVERSION N/A 05/15/2021   Procedure:  TRANSESOPHAGEAL ECHOCARDIOGRAM (TEE);  Surgeon: Jerline Pain, MD;  Location: [facility] ENDOSCOPY;  Service: Cardiovascular;  Laterality: N/A;   THYROID LOBECTOMY Right 07/15/2017   Procedure: RIGHT THYROID LOBECTOMY;  Surgeon: Armandina Gemma, MD;  Location: [facility];  Service: General;  Laterality: Right;     OB History   No obstetric history on file.     Family History  Problem Relation Age of Onset   Cancer Brother        possible prostate cancer per her daughter   Breast cancer Neg Hx     Social History   Tobacco Use   Smoking status: Never   Smokeless tobacco: Never  Vaping Use   Vaping Use: Never used  Substance Use Topics   Alcohol use: No   Drug use: No    Home Medications Prior to Admission medications   Medication Sig Start Date End Date Taking? Authorizing Provider  aspirin EC 81 MG tablet Take 81 mg by mouth daily.    [provider]  atorvastatin (LIPITOR) 40 MG tablet Take 1 tablet (40 mg total) by mouth daily. 05/17/21 06/16/21  Darliss Cheney, MD  cholecalciferol (VITAMIN D) 1000 units tablet Take 1,000 Units by mouth daily.    [provider]  KEPPRA XR 500 MG 24 hr tablet Take 500 mg by mouth daily. 01/21/21   [provider]  latanoprost (XALATAN) 0.005 % ophthalmic solution Place 1 drop into both eyes at bedtime. 09/07/18   Coral Spikes, DO  letrozole [facility]) 2.5 MG tablet Take 1 tablet (2.5 mg total) by mouth daily. 02/16/19   Nicholas Lose, MD  levothyroxine (SYNTHROID, LEVOTHROID) 112 MCG tablet Take 1 tablet (112 mcg total) by mouth daily before breakfast. 09/07/18   Coral Spikes, DO  metoprolol succinate (TOPROL-XL) 25 MG 24 hr tablet Take 50 mg by mouth daily. 08/20/20   [provider]  midodrine (PROAMATINE) 5 MG tablet Take 1 tablet (5 mg total) by mouth every Monday, Wednesday, and Friday with hemodialysis. 05/16/21 06/15/21  Darliss Cheney, MD  vancomycin (VANCOREADY) 500 MG/100ML IVPB Inject 100 mLs (500 mg total) into the vein  every Monday, Wednesday, and Friday with hemodialysis. 04/24/21 06/01/21  Wyvonnia Dusky, MD    Allergies    Contrast media [iodinated diagnostic agents], Latex, Shellfish-derived products, and Levemir [insulin detemir]  Review of Systems   Review of Systems  Unable to perform ROS: Mental status change   Physical Exam Updated Vital Signs BP (!) 177/106 (BP Location: Right Arm)   Pulse (!) 117   Temp (!) 97.5 F (36.4 C) (Axillary)   Resp 17   SpO2 96%   Physical Exam Vitals and nursing note reviewed.  Constitutional:      Appearance: She is ill-appearing.     Comments: Altered, sometimes moans and localizes to her name but otherwise is noncontributory  HENT:     Head: Normocephalic.  Mouth/Throat:     Mouth: Mucous membranes are dry.  Eyes:     Pupils: Pupils are equal, round, and reactive to light.  Cardiovascular:     Rate and Rhythm: Tachycardia present.  Pulmonary:     Effort: Pulmonary effort is normal. No respiratory distress.  Abdominal:     Palpations: Abdomen is soft.     Tenderness: There is no abdominal tenderness.  Skin:    General: Skin is warm.  Neurological:     Comments: Altered, doesn't follow commands    ED Results / Procedures / Treatments   Labs (all labs ordered are listed, but only abnormal results are displayed) Labs Reviewed  CBC WITH DIFFERENTIAL/PLATELET  COMPREHENSIVE METABOLIC PANEL  URINALYSIS, ROUTINE W REFLEX MICROSCOPIC  AMMONIA  LIPASE, BLOOD    EKG None  Radiology ECHO TEE  Result Date: 05/15/2021    TRANSESOPHOGEAL ECHO REPORT   Patient Name:   [name] Date of Exam: 05/15/2021 Medical Rec #:  [id number]         Height:       62.0 in Accession #:    [id number]        Weight:       124.6 lb Date of Birth:  October 05, 1946          BSA:          1.563 m Patient Age:    15 years          BP:           159/64 mmHg Patient Gender: F                 HR:           93 bpm. Exam Location:  Inpatient Procedure: 2D Echo, Color  Doppler and Saline Contrast Bubble Study Indications:     Bacteremia. CVA.  History:         Patient has prior history of Echocardiogram examinations, most                  recent 04/20/2021.  Sonographer:     Philipp Deputy Referring Phys:  [id number] HAO MENG Diagnosing Phys: Candee Furbish MD PROCEDURE: After discussion of the risks and benefits of a TEE, an informed consent was obtained from the patient. The transesophogeal probe was passed without difficulty through the esophogus of the patient. Imaged were obtained with the patient in a left lateral decubitus position. Local oropharyngeal anesthetic was provided with viscous lidocaine. Sedation performed by different physician. Image quality was adequate. The patient developed no complications during the procedure. IMPRESSIONS  1. +PFO, positive bubble study.  2. Left ventricular ejection fraction, by estimation, is 65 to 70%. The left ventricle has normal function.  3. Right ventricular systolic function is normal. The right ventricular size is normal.  4. No left atrial/left atrial appendage thrombus was detected.  5. The mitral valve is normal in structure. No evidence of mitral valve regurgitation. No evidence of mitral stenosis.  6. The aortic valve is tricuspid. Aortic valve regurgitation is mild. Mild aortic valve sclerosis is present, with no evidence of aortic valve stenosis.  7. Evidence of atrial level shunting detected by color flow Doppler. Agitated saline contrast bubble study was positive with shunting observed within 3-6 cardiac cycles suggestive of interatrial shunt. There is a small patent foramen ovale with predominantly right to left shunting across the atrial septum. Conclusion(s)/Recommendation(s): No evidence of vegetation/infective endocarditis on this transesophageal echocardiogram. +PFO, positive bubble study.  FINDINGS  Left Ventricle: Left ventricular ejection fraction, by estimation, is 65 to 70%. The left ventricle has normal function.  The left ventricular internal cavity size was small. Right Ventricle: The right ventricular size is normal. No increase in right ventricular wall thickness. Right ventricular systolic function is normal. Left Atrium: Left atrial size was normal in size. No left atrial/left atrial appendage thrombus was detected. Right Atrium: Right atrial size was normal in size. Pericardium: There is no evidence of pericardial effusion. Mitral Valve: The mitral valve is normal in structure. No evidence of mitral valve regurgitation. No evidence of mitral valve stenosis. Tricuspid Valve: The tricuspid valve is normal in structure. Tricuspid valve regurgitation is mild . No evidence of tricuspid stenosis. Aortic Valve: The aortic valve is tricuspid. Aortic valve regurgitation is mild. Mild aortic valve sclerosis is present, with no evidence of aortic valve stenosis. Pulmonic Valve: The pulmonic valve was normal in structure. Pulmonic valve regurgitation is not visualized. Aorta: The aortic root was not well visualized. Venous: The pulmonary veins were not well visualized. IAS/Shunts: Evidence of atrial level shunting detected by color flow Doppler. Agitated saline contrast was given intravenously to evaluate for intracardiac shunting. Agitated saline contrast bubble study was positive with shunting observed within 3-6 cardiac cycles suggestive of interatrial shunt. A small patent foramen ovale is detected with predominantly right to left shunting across the atrial septum. Additional Comments: +PFO, positive bubble study. Candee Furbish MD Electronically signed by Candee Furbish MD Signature Date/Time: 05/15/2021/12:24:43 PM    Final    VAS [place] LOWER EXTREMITY VENOUS (DVT)  Result Date: 05/16/2021  Lower Venous DVT Study Patient Name:  EMERLY PRAK  Date of Exam:   05/16/2021 Medical Rec #: [id number]          Accession #:    [id number] Date of Birth: [birth year]           Patient Gender: F Patient Age:   50Y Exam Location:  [facility] Procedure:      VAS [place] LOWER EXTREMITY VENOUS (DVT) Referring Phys: [id number] PRAMOD S SETHI --------------------------------------------------------------------------------  Indications: Stroke, and Positive PFO by TEE.  Limitations: Patient in reclining chair. Altered mental status. Comparison Study: Prior negative Left LEV done 04/10/21 and 12/26/16, are available                   for comparison Performing Technologist: Sharion Dove RVS  Examination Guidelines: A complete evaluation includes B-mode imaging, spectral Doppler, color Doppler, and power Doppler as needed of all accessible portions of each vessel. Bilateral testing is considered an integral part of a complete examination. Limited examinations for reoccurring indications may be performed as noted. The reflux portion of the exam is performed with the patient in reverse Trendelenburg.  +---------+---------------+---------+-----------+----------+-------------------+ RIGHT    CompressibilityPhasicitySpontaneityPropertiesThrombus Aging      +---------+---------------+---------+-----------+----------+-------------------+ CFV      Full           Yes      Yes                                      +---------+---------------+---------+-----------+----------+-------------------+ SFJ      Full                                                             +---------+---------------+---------+-----------+----------+-------------------+  FV Prox  Full                                                             +---------+---------------+---------+-----------+----------+-------------------+ FV Mid   Full                                                             +---------+---------------+---------+-----------+----------+-------------------+ FV DistalFull                                                             +---------+---------------+---------+-----------+----------+-------------------+ PFV                     Yes       Yes                                      +---------+---------------+---------+-----------+----------+-------------------+ POP      Full           Yes      Yes                                      +---------+---------------+---------+-----------+----------+-------------------+ PTV      Full                                                             +---------+---------------+---------+-----------+----------+-------------------+ PERO                                                  Not well visualized +---------+---------------+---------+-----------+----------+-------------------+   +---------+---------------+---------+-----------+----------+-------------------+ LEFT     CompressibilityPhasicitySpontaneityPropertiesThrombus Aging      +---------+---------------+---------+-----------+----------+-------------------+ CFV                     Yes      Yes                  patent by color and                                                       Doppler             +---------+---------------+---------+-----------+----------+-------------------+ FV Prox                 Yes  Yes                  patent by color and                                                       Doppler             +---------+---------------+---------+-----------+----------+-------------------+ FV Mid                  Yes      Yes                  patent by color and                                                       Doppler             +---------+---------------+---------+-----------+----------+-------------------+ FV Distal               Yes      Yes                  patent by color and                                                       Doppler             +---------+---------------+---------+-----------+----------+-------------------+ PFV                                                   Not well visualized  +---------+---------------+---------+-----------+----------+-------------------+ POP                     Yes      Yes                  patent by color and                                                       Doppler             +---------+---------------+---------+-----------+----------+-------------------+ PTV                                                   Not well visualized +---------+---------------+---------+-----------+----------+-------------------+ PERO                                                  Not well visualized +---------+---------------+---------+-----------+----------+-------------------+  Summary: RIGHT: - There is no evidence of deep vein thrombosis in the lower extremity. However, portions of this examination were limited- see technologist comments above.  LEFT: - There is no evidence of deep vein thrombosis in the lower extremity. However, portions of this examination were limited- see technologist comments above.  *See table(s) above for measurements and observations. Electronically signed by Jamelle Haring on 05/16/2021 at 7:17:31 PM.    Final     Procedures Procedures   Medications Ordered in ED Medications  sodium chloride 0.9 % bolus 500 mL (has no administration in time range)    ED Course  I have reviewed the triage vital signs and the nursing notes.  Pertinent labs & imaging results that were available during my care of the patient were reviewed by me and considered in my medical decision making (see chart for details).    MDM Rules/Calculators/A&P                          75 year old female presents emergency department altered mental status.  She is altered and lethargic on my evaluation, sometimes moans to her name but otherwise is noncontributory and does not follow commands.  She is tachycardic, at times tachypneic but otherwise afebrile.  Blood work shows a new leukocytosis.  Head CT is unchanged from previous.  No signs of  infection in the lungs.  Of note patient is currently being treated for bacteremia with reported IV vancomycin after hemodialysis sessions.  Lactic is normal, chemistry shows kidney dysfunction consistent with predialysis day, ammonia is only slightly elevated and I do not think the source of her altered mental status.  Patient appears encephalopathic.  On her most recent admission patient had waxing and waning periods of this encephalopathy.  MRI showed small strokes that did not seem to be contributing, EEG showed encephalopathy without epilepsy.  Unclear the source of patient's change in mental status, concern for infection now at this time.  Will admit for further treatment.  Patients evaluation and results requires admission for further treatment and care. Patient agrees with admission plan, offers no new complaints and is stable/unchanged at time of admit.   Final Clinical Impression(s) / ED Diagnoses Final diagnoses:  None    Rx / DC Orders ED Discharge Orders     None        Lorelle Gibbs, DO 05/17/21 1251

## 2021-05-17 NOTE — ED Notes (Addendum)
Error in charting - [facility] noted at 0833 was done at [place] not [phone]

## 2021-05-17 NOTE — H&P (Signed)
History and Physical    Kirsten Mcmahon IBB:[id number] DOB: 14-Nov-1946 DOA: 05/17/2021  Referring MD/NP/PA: Lavenia Atlas, DO PCP: [facility]  Patient coming from: Home via EMS  Chief Complaint: Altered mental status I have personally briefly reviewed patient's old medical records in [place]   HPI: Kirsten Mcmahon is a 75 y.o. female with medical history significant of ESRD on HD, HTN, HLD, CVA with residual left-sided hemiparesis, seizure disorder, hypothyroidism,  DM type II, streptococcal bacteremia on IV vancomycin with dialysis, and intermittent encephalopathy presents for altered mental status and episode of vomiting.  History is obtained from talks with the patient's son over the phone.  After patient had been able to be transported home yesterday.  Son states that the patient was not talking, would only moving her head back and forth, and was unwilling to eat.  She seemed like she was still heavily sedated, and he wondered if it was related to her having the TTE on 6/10.  He states that he tried to let her rest and this morning around 4 AM and tried to feed the patient, but she started vomiting up this dark green material with foul odor all over the place and seemed as though she were choking on the emesis.  He immediately called 911.  He is unsure of any blood was present but states there might have possibly been some blood.  She had last had hemodialysis on 6/11 prior to discharge.  ED Course: Upon admission into the hospital patient was seen to be afebrile, pulse 86-1 19, respiration 15-26, blood pressures 116/79 177/106, and O2 saturations currently maintained on room air.  Labs significant for WBC 23.6 with left, hemoglobin 10.2, BUN 29, creatinine 4.7, glucose 191, anion gap 15, lactic acid 1.8- >1.4, ammonia level 37, and total bilirubin 1.8.  Venous blood gas pH 7.475, PCO2 39.4, and PO2 32.  Chest x-ray noted stable cardiomegaly without acute infiltrate.   CT scan of the head stable compared to recent CT with acute infarct by brain MRI to be occult by CT. patient has been given 500 mL of normal saline IV fluids.  TRH called to admit.  Review of Systems  Unable to perform ROS: Mental status change   Past Medical History:  Diagnosis Date   Cancer ([place]) 2017   Right breast   Depression    GERD (gastroesophageal reflux disease)    Glaucoma    Hemiparesis ([facility])    left side   High cholesterol    History of seizure    x 1 - after a spider bite   History of stroke with residual deficit    left-side weakness   Hypertension    states BP under control with meds., has been on med. x 2 yr.   Hypothyroidism    Non-insulin dependent type 2 diabetes mellitus ([place])    Overactive bladder    Stroke [facility])    1998 weakness on left side    Past Surgical History:  Procedure Laterality Date   ABDOMINAL HYSTERECTOMY     complete   BREAST BIOPSY Left 08/03/2018   Benign adipose tissue   BREAST EXCISIONAL BIOPSY Right 2014   Positive   BREAST LUMPECTOMY Right    BUBBLE STUDY  05/15/2021   Procedure: BUBBLE STUDY;  Surgeon: Jerline Pain, MD;  Location: [facility];  Service: Cardiovascular;;   CATARACT EXTRACTION W/ INTRAOCULAR LENS IMPLANT Left    CEREBRAL ANEURYSM REPAIR  1998   DIALYSIS/PERMA  CATHETER INSERTION N/A 04/10/2021   Procedure: DIALYSIS/PERMA CATHETER INSERTION;  Surgeon: Algernon Huxley, MD;  Location: [facility];  Service: Cardiovascular;  Laterality: N/A;   IR FLUORO GUIDE CV LINE RIGHT  04/30/2021   PICC LINE INSERTION     TEE WITHOUT CARDIOVERSION N/A 05/15/2021   Procedure: TRANSESOPHAGEAL ECHOCARDIOGRAM (TEE);  Surgeon: Jerline Pain, MD;  Location: [facility] ENDOSCOPY;  Service: Cardiovascular;  Laterality: N/A;   THYROID LOBECTOMY Right 07/15/2017   Procedure: RIGHT THYROID LOBECTOMY;  Surgeon: Armandina Gemma, MD;  Location: [facility];  Service: General;  Laterality: Right;     reports that she has never smoked. She has never  used smokeless tobacco. She reports that she does not drink alcohol and does not use drugs.  Allergies  Allergen Reactions   Contrast Media [Iodinated Diagnostic Agents]     Other reaction(s): NO ALLERGY   Latex     Other reaction(s): NO ALLERGY   Shellfish-Derived Products     Other reaction(s): NO ALLERGY   Levemir [Insulin Detemir] Itching    Family History  Problem Relation Age of Onset   Cancer Brother        possible prostate cancer per her daughter   Breast cancer Neg Hx     Prior to Admission medications   Medication Sig Start Date End Date Taking? Authorizing Provider  aspirin EC 81 MG tablet Take 81 mg by mouth daily.    [provider]  atorvastatin (LIPITOR) 40 MG tablet Take 1 tablet (40 mg total) by mouth daily. 05/17/21 06/16/21  Darliss Cheney, MD  cholecalciferol (VITAMIN D) 1000 units tablet Take 1,000 Units by mouth daily.    [provider]  KEPPRA XR 500 MG 24 hr tablet Take 500 mg by mouth daily. 01/21/21   [provider]  latanoprost (XALATAN) 0.005 % ophthalmic solution Place 1 drop into both eyes at bedtime. 09/07/18   Coral Spikes, DO  letrozole [facility]) 2.5 MG tablet Take 1 tablet (2.5 mg total) by mouth daily. 02/16/19   Nicholas Lose, MD  levothyroxine (SYNTHROID, LEVOTHROID) 112 MCG tablet Take 1 tablet (112 mcg total) by mouth daily before breakfast. 09/07/18   Coral Spikes, DO  metoprolol succinate (TOPROL-XL) 25 MG 24 hr tablet Take 50 mg by mouth daily. 08/20/20   [provider]  midodrine (PROAMATINE) 5 MG tablet Take 1 tablet (5 mg total) by mouth every Monday, Wednesday, and Friday with hemodialysis. 05/16/21 06/15/21  Darliss Cheney, MD  vancomycin (VANCOREADY) 500 MG/100ML IVPB Inject 100 mLs (500 mg total) into the vein every Monday, Wednesday, and Friday with hemodialysis. 04/24/21 06/01/21  Wyvonnia Dusky, MD    Physical Exam:  Constitutional: Elderly female who appears to be altered unable to follow  commands Vitals:   05/17/21 0707 05/17/21 0830  BP: (!) 177/106 (!) 173/109  Pulse: (!) 117 (!) 116  Resp: 17 (!) 23  Temp: (!) 97.5 F (36.4 C)   TempSrc: Axillary   SpO2: 96% 98%   Eyes: PERRL, lids and conjunctivae normal ENMT: Mucous membranes are dry.  Posterior pharynx clear of any exudate or lesions.   Neck: normal, supple, no masses, no thyromegaly Respiratory: Tachypneic currently on 2 L nasal cannula oxygen with O2 saturations maintained.  lungs otherwise sound clear. Cardiovascular: Tachycardia, no murmurs / rubs / gallops. No extremity edema. 2+ pedal pulses. No carotid bruits.  Abdomen: tenderness palpation of the abdomen. Musculoskeletal: no clubbing / cyanosis. No joint deformity upper and lower extremities.  Good ROM, no contractures. Normal muscle tone.  Skin: no rashes, lesions, ulcers. No induration Neurologic: CN 2-12 grossly intact.  Left-sided hemiparesis Psychiatric: Lethargic unable to assess orientation.    Labs on Admission: I have personally reviewed following labs and imaging studies  CBC: Recent Labs  Lab 05/13/21 1416 05/15/21 1117 05/16/21 0714 05/17/21 0808  WBC 7.3  --  8.1 23.6*  NEUTROABS  --   --   --  22.0*  HGB 8.2* 7.5* 7.1* 10.2*  HCT 26.5* 22.0* 23.8* 32.3*  MCV 92.3  --  95.2 92.6  PLT 251  --  291 161   Basic Metabolic Panel: Recent Labs  Lab 05/13/21 1416 05/15/21 1117 05/16/21 0714 05/17/21 0808  NA 136 137 137 139  K 3.4* 4.1 4.0 4.9  CL 97* 101 101 100  CO2 28  --  29 24  GLUCOSE 197* 100* 104* 191*  BUN 30* 27* 11 29*  CREATININE 5.59* 5.40* 2.47* 4.70*  CALCIUM 8.6*  --  7.8* 9.3  PHOS 3.6  --  1.7*  --    GFR: Estimated Creatinine Clearance: 8.1 mL/min (A) (by C-G formula based on SCr of 4.7 mg/dL (H)). Liver Function Tests: Recent Labs  Lab 05/13/21 1416 05/16/21 0714 05/17/21 0808  AST  --   --  28  ALT  --   --  16  ALKPHOS  --   --  87  BILITOT  --   --  1.8*  PROT  --   --  7.9  ALBUMIN 2.6*  2.6* 3.5   Recent Labs  Lab 05/17/21 0808  LIPASE 26   Recent Labs  Lab 05/17/21 0808  AMMONIA 37*   Coagulation Profile: No results for input(s): INR, PROTIME in the last 168 hours. Cardiac Enzymes: No results for input(s): CKTOTAL, CKMB, CKMBINDEX, TROPONINI in the last 168 hours. BNP (last 3 results) No results for input(s): PROBNP in the last 8760 hours. HbA1C: No results for input(s): HGBA1C in the last 72 hours. CBG: Recent Labs  Lab 05/10/21 1201 05/10/21 1718 05/11/21 1955 05/12/21 0742 05/14/21 1545  GLUCAP 107* 104* 144* 90 85   Lipid Profile: No results for input(s): CHOL, HDL, LDLCALC, TRIG, CHOLHDL, LDLDIRECT in the last 72 hours. Thyroid Function Tests: No results for input(s): TSH, T4TOTAL, FREET4, T3FREE, THYROIDAB in the last 72 hours. Anemia Panel: No results for input(s): VITAMINB12, FOLATE, FERRITIN, TIBC, IRON, RETICCTPCT in the last 72 hours. Urine analysis:    Component Value Date/Time   COLORURINE YELLOW (A) 04/17/2021 1702   APPEARANCEUR HAZY (A) 04/17/2021 1702   LABSPEC 1.015 04/17/2021 1702   PHURINE 7.0 04/17/2021 1702   GLUCOSEU 150 (A) 04/17/2021 1702   HGBUR NEGATIVE 04/17/2021 1702   BILIRUBINUR NEGATIVE 04/17/2021 1702   KETONESUR 5 (A) 04/17/2021 1702   PROTEINUR >=300 (A) 04/17/2021 1702   NITRITE NEGATIVE 04/17/2021 1702   LEUKOCYTESUR NEGATIVE 04/17/2021 1702   Sepsis Labs: No results found for this or any previous visit (from the past 240 hour(s)).   Radiological Exams on Admission: CT Head Wo Contrast  Result Date: 05/17/2021 CLINICAL DATA:  Mental status change with unknown cause. EXAM: CT HEAD WITHOUT CONTRAST TECHNIQUE: Contiguous axial images were obtained from the base of the skull through the vertex without intravenous contrast. COMPARISON:  05/07/2021 FINDINGS: Brain: Patchy remote bilateral cerebellar infarction. Remote right MCA distribution infarct with extensive gliosis and wallerian degeneration seen into the  brainstem. Confluent small-vessel ischemic type gliosis in the cerebral white matter. No  hemorrhage, hydrocephalus, or masslike finding. Vascular: No hyperdense vessel or unexpected calcification. Skull: Right craniectomy, presumably for cytotoxic edema decompression, with stable sunken appearance. Sinuses/Orbits: Negative IMPRESSION: 1. Stable compared to recent CT. Acute infarcts by brain MRI are occult by CT. 2. Extensive chronic ischemic injury. Electronically Signed   By: Monte Fantasia M.D.   On: 05/17/2021 09:25   DG Chest Port 1 View  Result Date: 05/17/2021 CLINICAL DATA:  Altered mental status. EXAM: PORTABLE CHEST 1 VIEW COMPARISON:  05/04/2021 FINDINGS: Right IJ dialysis catheter unchanged with tip over the right atrium. Patient is slightly rotated to the right. Lungs are adequately inflated and otherwise clear. Mild stable cardiomegaly. Remainder of the exam is unchanged. IMPRESSION: 1. No acute cardiopulmonary disease. 2. Mild stable cardiomegaly. Electronically Signed   By: Marin Olp M.D.   On: 05/17/2021 08:43   ECHO TEE  Result Date: 05/15/2021    TRANSESOPHOGEAL ECHO REPORT   Patient Name:   DAJA SHUPING Date of Exam: 05/15/2021 Medical Rec #:  [id number]         Height:       62.0 in Accession #:    [id number]        Weight:       124.6 lb Date of Birth:  1946-02-10          BSA:          1.563 m Patient Age:    1 years          BP:           159/64 mmHg Patient Gender: F                 HR:           93 bpm. Exam Location:  Inpatient Procedure: 2D Echo, Color Doppler and Saline Contrast Bubble Study Indications:     Bacteremia. CVA.  History:         Patient has prior history of Echocardiogram examinations, most                  recent 04/20/2021.  Sonographer:     Philipp Deputy Referring Phys:  [id number] HAO MENG Diagnosing Phys: Candee Furbish MD PROCEDURE: After discussion of the risks and benefits of a TEE, an informed consent was obtained from the patient. The transesophogeal  probe was passed without difficulty through the esophogus of the patient. Imaged were obtained with the patient in a left lateral decubitus position. Local oropharyngeal anesthetic was provided with viscous lidocaine. Sedation performed by different physician. Image quality was adequate. The patient developed no complications during the procedure. IMPRESSIONS  1. +PFO, positive bubble study.  2. Left ventricular ejection fraction, by estimation, is 65 to 70%. The left ventricle has normal function.  3. Right ventricular systolic function is normal. The right ventricular size is normal.  4. No left atrial/left atrial appendage thrombus was detected.  5. The mitral valve is normal in structure. No evidence of mitral valve regurgitation. No evidence of mitral stenosis.  6. The aortic valve is tricuspid. Aortic valve regurgitation is mild. Mild aortic valve sclerosis is present, with no evidence of aortic valve stenosis.  7. Evidence of atrial level shunting detected by color flow Doppler. Agitated saline contrast bubble study was positive with shunting observed within 3-6 cardiac cycles suggestive of interatrial shunt. There is a small patent foramen ovale with predominantly right to left shunting across the atrial septum. Conclusion(s)/Recommendation(s): No evidence of vegetation/infective endocarditis on this transesophageal  echocardiogram. +PFO, positive bubble study.  FINDINGS  Left Ventricle: Left ventricular ejection fraction, by estimation, is 65 to 70%. The left ventricle has normal function. The left ventricular internal cavity size was small. Right Ventricle: The right ventricular size is normal. No increase in right ventricular wall thickness. Right ventricular systolic function is normal. Left Atrium: Left atrial size was normal in size. No left atrial/left atrial appendage thrombus was detected. Right Atrium: Right atrial size was normal in size. Pericardium: There is no evidence of pericardial effusion.  Mitral Valve: The mitral valve is normal in structure. No evidence of mitral valve regurgitation. No evidence of mitral valve stenosis. Tricuspid Valve: The tricuspid valve is normal in structure. Tricuspid valve regurgitation is mild . No evidence of tricuspid stenosis. Aortic Valve: The aortic valve is tricuspid. Aortic valve regurgitation is mild. Mild aortic valve sclerosis is present, with no evidence of aortic valve stenosis. Pulmonic Valve: The pulmonic valve was normal in structure. Pulmonic valve regurgitation is not visualized. Aorta: The aortic root was not well visualized. Venous: The pulmonary veins were not well visualized. IAS/Shunts: Evidence of atrial level shunting detected by color flow Doppler. Agitated saline contrast was given intravenously to evaluate for intracardiac shunting. Agitated saline contrast bubble study was positive with shunting observed within 3-6 cardiac cycles suggestive of interatrial shunt. A small patent foramen ovale is detected with predominantly right to left shunting across the atrial septum. Additional Comments: +PFO, positive bubble study. Candee Furbish MD Electronically signed by Candee Furbish MD Signature Date/Time: 05/15/2021/12:24:43 PM    Final    VAS [place] LOWER EXTREMITY VENOUS (DVT)  Result Date: 05/16/2021  Lower Venous DVT Study Patient Name:  Kirsten Mcmahon  Date of Exam:   05/16/2021 Medical Rec #: [id number]          Accession #:    [id number] Date of Birth: 08/30/1946           Patient Gender: F Patient Age:   66Y Exam Location:  [facility] Procedure:      VAS [place] LOWER EXTREMITY VENOUS (DVT) Referring Phys: [id number] PRAMOD S SETHI --------------------------------------------------------------------------------  Indications: Stroke, and Positive PFO by TEE.  Limitations: Patient in reclining chair. Altered mental status. Comparison Study: Prior negative Left LEV done 04/10/21 and 12/26/16, are available                   for comparison Performing  Technologist: Sharion Dove RVS  Examination Guidelines: A complete evaluation includes B-mode imaging, spectral Doppler, color Doppler, and power Doppler as needed of all accessible portions of each vessel. Bilateral testing is considered an integral part of a complete examination. Limited examinations for reoccurring indications may be performed as noted. The reflux portion of the exam is performed with the patient in reverse Trendelenburg.  +---------+---------------+---------+-----------+----------+-------------------+ RIGHT    CompressibilityPhasicitySpontaneityPropertiesThrombus Aging      +---------+---------------+---------+-----------+----------+-------------------+ CFV      Full           Yes      Yes                                      +---------+---------------+---------+-----------+----------+-------------------+ SFJ      Full                                                             +---------+---------------+---------+-----------+----------+-------------------+  FV Prox  Full                                                             +---------+---------------+---------+-----------+----------+-------------------+ FV Mid   Full                                                             +---------+---------------+---------+-----------+----------+-------------------+ FV DistalFull                                                             +---------+---------------+---------+-----------+----------+-------------------+ PFV                     Yes      Yes                                      +---------+---------------+---------+-----------+----------+-------------------+ POP      Full           Yes      Yes                                      +---------+---------------+---------+-----------+----------+-------------------+ PTV      Full                                                              +---------+---------------+---------+-----------+----------+-------------------+ PERO                                                  Not well visualized +---------+---------------+---------+-----------+----------+-------------------+   +---------+---------------+---------+-----------+----------+-------------------+ LEFT     CompressibilityPhasicitySpontaneityPropertiesThrombus Aging      +---------+---------------+---------+-----------+----------+-------------------+ CFV                     Yes      Yes                  patent by color and                                                       Doppler             +---------+---------------+---------+-----------+----------+-------------------+ FV Prox                 Yes  Yes                  patent by color and                                                       Doppler             +---------+---------------+---------+-----------+----------+-------------------+ FV Mid                  Yes      Yes                  patent by color and                                                       Doppler             +---------+---------------+---------+-----------+----------+-------------------+ FV Distal               Yes      Yes                  patent by color and                                                       Doppler             +---------+---------------+---------+-----------+----------+-------------------+ PFV                                                   Not well visualized +---------+---------------+---------+-----------+----------+-------------------+ POP                     Yes      Yes                  patent by color and                                                       Doppler             +---------+---------------+---------+-----------+----------+-------------------+ PTV                                                   Not well visualized  +---------+---------------+---------+-----------+----------+-------------------+ PERO                                                  Not well visualized +---------+---------------+---------+-----------+----------+-------------------+  Summary: RIGHT: - There is no evidence of deep vein thrombosis in the lower extremity. However, portions of this examination were limited- see technologist comments above.  LEFT: - There is no evidence of deep vein thrombosis in the lower extremity. However, portions of this examination were limited- see technologist comments above.  *See table(s) above for measurements and observations. Electronically signed by Jamelle Haring on 05/16/2021 at 7:17:31 PM.    Final     EKG: Independently reviewed.  Sinus tachycardia at 112 bpm  Assessment/Plan Acute metabolic encephalopathy: Patient presents after found to be altered not able to talk or eat after getting home hospital yesterday.  Records note patient has had this waxing and waning mental status.  Ammonia mildly elevated at 37. -Admit to a medical telemetry bed -Continuous pulse oximetry with nasal cannula oxygen as needed to maintain O2 saturation greater than 92% -Neurochecks -Seizure precautions -Recheck EEG -PT/OT/speech  Nausea and vomiting: Acute.  Patient was reported to have acute onset of nausea and vomiting this morning around 4 AM.  Emesis was reported to be dark green and foul in odor and she was getting choked up on emesis.  Chest x-ray showed no signs of aspiration.  -Aspiration precautions with elevation of the head of bed -Check CT scan of the abdomen and pelvis without contrast  SIRS: Acute.  Patient presented with tachycardia and tachypnea with white blood cell count elevated up to 23.6.  Chest x-ray was otherwise clear.  Question if this is leukemoid reaction related to the vomiting.  She had been on vancomycin IV in the outpatient setting with hemodialysis. -Follow-up blood cultures -Save  peripheral smear -Placed on empiric antibiotics of Zosyn  ESRD on HD: Patient was just dialyzed on 6/11 prior to being discharged.  Patient normally is a Tuesday, Thursday, and Saturday dialysis patient. -Nephrology notified of the patient being admitted into the hospital we will follow patient in hospital.  Recent acute ischemic stroke history right MCA with residual left-sided hemiparesis: MRI of the brain 6/7 showed bilateral multiple punctate infarcts.  Neurology was consulted and patient underwent MRA of the head, Doppler ultrasound of the carotids that was negative, and EEG that was negative as well.  Patient was found to have PFO and was negative for any signs of a pulmonary embolus.  Neurology felt strokes were not the cause of patient's encephalopathy.  She was advised to continue on with aspirin. -Continue aspirin and statin when patient able to tolerate p.o.  Streptococcus bacteremia: Patient was found to be positive for Streptococcus group G from blood cultures on 5/13.  She had been initially on IV ampicillin and was switched to IV vancomycin in the outpatient setting to continue therapy until 6/24. -Follow-up repeat blood cultures  Anemia of chronic kidney disease: Hemoglobin 10.2 g/dL today, but was 7.1 on 05/16/2021.  Suspect patient has significant dehydrated from recent episodes of vomiting and this is related with hemoconcentration. -Continue to monitor H&H  Essential hypertension: Home blood pressure medications include metoprolol 50 mg daily. -Continue metoprolol when able to tolerate p.o.  Seizure disorder: No reported seizure-like activity.  Home regimen include Keppra XR  500 mg p.o. daily. -Change Keppra p.o. to IV  Non-insulin-dependent diabetes mellitus type 2 sugars were noted to be -Hypoglycemic protocol -CBGs every 4 hours  -D50 amp as needed for low blood sugar  Hypothyroidism -Continue levothyroxine when able to tolerate p.o.  Hyperammonia: Acute.  Ammonia  level was mildly elevated at 37 on admission.  Do not think  that this is the cause of patient's altered state at this time.  DVT prophylaxis: heparin  Code Status: Full Family Communication: Patient's son was updated over the phone Disposition Plan: To be determined Consults called: Nephrology Admission status: Inpatient, require more than 2 midnight stay.  Norval Morton MD [facility]   If 7PM-7AM, please contact night-coverage   05/17/2021, 11:38 AM

## 2021-05-17 NOTE — ED Notes (Signed)
I sent down the blood that the nightshift RN drew at 7583 and clicked off in the computer, except for ammonia, pt now leaving for CT, will attempt to obtain on pt's return

## 2021-05-17 NOTE — ED Notes (Signed)
Patient transported to CT via CT tech

## 2021-05-17 NOTE — Progress Notes (Addendum)
Pharmacy Antibiotic Note  Kirsten Mcmahon is a 75 y.o. female admitted on 05/17/2021 with  intra-abdominal infection .  Pharmacy has been consulted for Zosyn dosing.  WBC 23.6, on MWF HD   Plan: -Zosyn 2.25 gm IV Q 8 hours  -Monitor CBC, cultures and clinical progress      Temp (24hrs), Avg:98.3 F (36.8 C), Min:97.5 F (36.4 C), Max:99 F (37.2 C)  Recent Labs  Lab 05/13/21 1416 05/14/21 0214 05/15/21 0130 05/15/21 1117 05/16/21 0714 05/17/21 0808 05/17/21 0936 05/17/21 1138  WBC 7.3  --   --   --  8.1 23.6*  --   --   CREATININE 5.59*  --   --  5.40* 2.47* 4.70*  --   --   LATICACIDVEN  --   --   --   --   --   --  1.8 1.4  VANCORANDOM  --  24 21  --   --   --   --   --     Estimated Creatinine Clearance: 8.1 mL/min (A) (by C-G formula based on SCr of 4.7 mg/dL (H)).    Allergies  Allergen Reactions   Contrast Media [Iodinated Diagnostic Agents]     Other reaction(s): NO ALLERGY   Latex     Other reaction(s): NO ALLERGY   Shellfish-Derived Products     Other reaction(s): NO ALLERGY   Levemir [Insulin Detemir] Itching    Antimicrobials this admission: Zosyn 6/12 >>   Dose adjustments this admission:  Microbiology results: 6/12 BCx:    Thank you for allowing pharmacy to be a part of this patient's care.  Albertina Parr, PharmD., BCPS, BCCCP Clinical Pharmacist Please refer to [facility] for unit-specific pharmacist

## 2021-05-17 NOTE — ED Notes (Signed)
Pt suddenly desat to 86 percent with good pleth, then pt rebounded back to 96 percent on room air after approx one minute.  MD notified, pt placed on 2L O@/[place]

## 2021-05-17 NOTE — ED Notes (Signed)
Patient transported to CT 

## 2021-05-17 NOTE — ED Notes (Signed)
Pt nonverbal, follows no commands, withdraws to pain, PEARL at 42mm, moving right arm freely, other extremities stiff and pt groans in pain when I manually move her extremities or her head

## 2021-05-17 NOTE — ED Notes (Signed)
I attempted and mini-lab personnel attempted to draw her ammonia, small sample was sent to lab. There is an order for the iv team

## 2021-05-18 ENCOUNTER — Encounter (HOSPITAL_COMMUNITY): Payer: Self-pay | Admitting: Internal Medicine

## 2021-05-18 ENCOUNTER — Other Ambulatory Visit: Payer: Self-pay

## 2021-05-18 DIAGNOSIS — R7881 Bacteremia: Secondary | ICD-10-CM | POA: Diagnosis not present

## 2021-05-18 DIAGNOSIS — G9341 Metabolic encephalopathy: Secondary | ICD-10-CM | POA: Diagnosis not present

## 2021-05-18 DIAGNOSIS — N186 End stage renal disease: Secondary | ICD-10-CM | POA: Diagnosis not present

## 2021-05-18 DIAGNOSIS — I10 Essential (primary) hypertension: Secondary | ICD-10-CM | POA: Diagnosis not present

## 2021-05-18 LAB — CBC
HCT: 28.2 % — ABNORMAL LOW (ref 36.0–46.0)
HCT: 28.6 % — ABNORMAL LOW (ref 36.0–46.0)
Hemoglobin: 9 g/dL — ABNORMAL LOW (ref 12.0–15.0)
Hemoglobin: 9 g/dL — ABNORMAL LOW (ref 12.0–15.0)
MCH: 29.4 pg (ref 26.0–34.0)
MCH: 29.6 pg (ref 26.0–34.0)
MCHC: 31.9 g/dL (ref 30.0–36.0)
MCHC: 31.5 g/dL (ref 30.0–36.0)
MCV: 92.2 fL (ref 80.0–100.0)
MCV: 94.1 fL (ref 80.0–100.0)
Platelets: 220 10*3/uL (ref 150–400)
Platelets: 255 10*3/uL (ref 150–400)
RBC: 3.06 MIL/uL — ABNORMAL LOW (ref 3.87–5.11)
RBC: 3.04 MIL/uL — ABNORMAL LOW (ref 3.87–5.11)
RDW: 15.1 % (ref 11.5–15.5)
RDW: 15.1 % (ref 11.5–15.5)
WBC: 16.4 10*3/uL — ABNORMAL HIGH (ref 4.0–10.5)
WBC: 16.4 10*3/uL — ABNORMAL HIGH (ref 4.0–10.5)
nRBC: 0 % (ref 0.0–0.2)
nRBC: 0 % (ref 0.0–0.2)

## 2021-05-18 LAB — RENAL FUNCTION PANEL
Albumin: 3 g/dL — ABNORMAL LOW (ref 3.5–5.0)
Albumin: 2.9 g/dL — ABNORMAL LOW (ref 3.5–5.0)
Anion gap: 14 (ref 5–15)
Anion gap: 14 (ref 5–15)
BUN: 37 mg/dL — ABNORMAL HIGH (ref 8–23)
BUN: 39 mg/dL — ABNORMAL HIGH (ref 8–23)
CO2: 25 mmol/L (ref 22–32)
CO2: 25 mmol/L (ref 22–32)
Calcium: 9 mg/dL (ref 8.9–10.3)
Calcium: 8.8 mg/dL — ABNORMAL LOW (ref 8.9–10.3)
Chloride: 102 mmol/L (ref 98–111)
Chloride: 104 mmol/L (ref 98–111)
Creatinine, Ser: 5.24 mg/dL — ABNORMAL HIGH (ref 0.44–1.00)
Creatinine, Ser: 5.6 mg/dL — ABNORMAL HIGH (ref 0.44–1.00)
GFR, Estimated: 8 mL/min — ABNORMAL LOW
GFR, Estimated: 7 mL/min — ABNORMAL LOW (ref >60)
Glucose, Bld: 139 mg/dL — ABNORMAL HIGH (ref 70–99)
Glucose, Bld: 129 mg/dL — ABNORMAL HIGH (ref 70–99)
Phosphorus: 4.7 mg/dL — ABNORMAL HIGH (ref 2.5–4.6)
Phosphorus: 4.9 mg/dL — ABNORMAL HIGH (ref 2.5–4.6)
Potassium: 4.1 mmol/L (ref 3.5–5.1)
Potassium: 4.3 mmol/L (ref 3.5–5.1)
Sodium: 141 mmol/L (ref 135–145)
Sodium: 143 mmol/L (ref 135–145)

## 2021-05-18 LAB — GLUCOSE, CAPILLARY
Glucose-Capillary: 109 mg/dL — ABNORMAL HIGH (ref 70–99)
Glucose-Capillary: 125 mg/dL — ABNORMAL HIGH (ref 70–99)
Glucose-Capillary: 141 mg/dL — ABNORMAL HIGH (ref 70–99)
Glucose-Capillary: 71 mg/dL (ref 70–99)
Glucose-Capillary: 81 mg/dL (ref 70–99)

## 2021-05-18 MED ORDER — PENTAFLUOROPROP-TETRAFLUOROETH EX AERO: 1.0000 "application " | INHALATION_SPRAY | CUTANEOUS | Status: DC | PRN

## 2021-05-18 MED ORDER — ALTEPLASE 2 MG IJ SOLR: 2.0000 mg | Freq: Once | INTRAMUSCULAR | Status: DC | PRN

## 2021-05-18 MED ORDER — SODIUM CHLORIDE 0.9 % IV SOLN: 100.0000 mL | INTRAVENOUS | Status: DC | PRN

## 2021-05-18 MED ORDER — ASPIRIN EC 81 MG PO TBEC
81.0000 mg | DELAYED_RELEASE_TABLET | Freq: Every day | ORAL | Status: DC
  Administered 2021-05-18 – 2021-05-24 (×7): 81 mg via ORAL
  Filled 2021-05-18 (×7): qty 1

## 2021-05-18 MED ORDER — LIDOCAINE HCL (PF) 1 % IJ SOLN
5.0000 mL | INTRAMUSCULAR | Status: DC | PRN
  Filled 2021-05-18: qty 5

## 2021-05-18 MED ORDER — LEVOTHYROXINE SODIUM 112 MCG PO TABS
112.0000 ug | ORAL_TABLET | Freq: Every day | ORAL | Status: DC
  Administered 2021-05-19 – 2021-05-24 (×6): 112 ug via ORAL
  Filled 2021-05-18 (×6): qty 1

## 2021-05-18 MED ORDER — LETROZOLE 2.5 MG PO TABS
2.5000 mg | ORAL_TABLET | Freq: Every day | ORAL | Status: DC
  Administered 2021-05-18 – 2021-05-24 (×7): 2.5 mg via ORAL
  Filled 2021-05-18 (×7): qty 1

## 2021-05-18 MED ORDER — RENA-VITE PO TABS
1.0000 | ORAL_TABLET | Freq: Every day | ORAL | Status: DC
  Administered 2021-05-18 – 2021-05-23 (×6): 1 via ORAL
  Filled 2021-05-18 (×6): qty 1

## 2021-05-18 MED ORDER — CLOPIDOGREL BISULFATE 75 MG PO TABS
75.0000 mg | ORAL_TABLET | Freq: Every day | ORAL | Status: DC
  Administered 2021-05-18 – 2021-05-21 (×4): 75 mg via ORAL
  Filled 2021-05-18 (×4): qty 1

## 2021-05-18 MED ORDER — NEPRO/CARBSTEADY PO LIQD
237.0000 mL | Freq: Two times a day (BID) | ORAL | Status: DC
  Administered 2021-05-18 – 2021-05-19 (×3): 237 mL via ORAL

## 2021-05-18 MED ORDER — HEPARIN SODIUM (PORCINE) 1000 UNIT/ML DIALYSIS: 1000.0000 [IU] | INTRAMUSCULAR | Status: DC | PRN

## 2021-05-18 MED ORDER — PROSOURCE PLUS PO LIQD
30.0000 mL | Freq: Three times a day (TID) | ORAL | Status: DC
  Administered 2021-05-18 – 2021-05-24 (×12): 30 mL via ORAL
  Filled 2021-05-18 (×12): qty 30

## 2021-05-18 MED ORDER — HEPARIN SODIUM (PORCINE) 1000 UNIT/ML IJ SOLN
INTRAMUSCULAR | Status: AC
  Filled 2021-05-18: qty 4

## 2021-05-18 MED ORDER — POLYETHYLENE GLYCOL 3350 17 G PO PACK
17.0000 g | PACK | Freq: Two times a day (BID) | ORAL | Status: AC
  Administered 2021-05-18 – 2021-05-19 (×4): 17 g via ORAL
  Filled 2021-05-18 (×4): qty 1

## 2021-05-18 MED ORDER — HEPARIN SODIUM (PORCINE) 1000 UNIT/ML IJ SOLN
INTRAMUSCULAR | Status: AC
  Filled 2021-05-18: qty 2

## 2021-05-18 MED ORDER — LATANOPROST 0.005 % OP SOLN
1.0000 [drp] | Freq: Every day | OPHTHALMIC | Status: DC
  Administered 2021-05-19 – 2021-05-23 (×5): 1 [drp] via OPHTHALMIC
  Filled 2021-05-18 (×2): qty 2.5

## 2021-05-18 MED ORDER — MIDODRINE HCL 5 MG PO TABS
5.0000 mg | ORAL_TABLET | ORAL | Status: DC
Start: 2021-05-20 — End: 2021-05-24
  Filled 2021-05-18: qty 1

## 2021-05-18 MED ORDER — LIDOCAINE-PRILOCAINE 2.5-2.5 % EX CREA
1.0000 "application " | TOPICAL_CREAM | CUTANEOUS | Status: DC | PRN
  Filled 2021-05-18: qty 5

## 2021-05-18 MED ORDER — ATORVASTATIN CALCIUM 40 MG PO TABS
40.0000 mg | ORAL_TABLET | Freq: Every day | ORAL | Status: DC
  Administered 2021-05-18 – 2021-05-24 (×7): 40 mg via ORAL
  Filled 2021-05-18 (×7): qty 1

## 2021-05-18 MED ORDER — HYDRALAZINE HCL 20 MG/ML IJ SOLN: 10.0000 mg | INTRAMUSCULAR | Status: DC | PRN

## 2021-05-18 MED ORDER — METOPROLOL SUCCINATE ER 50 MG PO TB24
50.0000 mg | ORAL_TABLET | Freq: Every day | ORAL | Status: DC
  Administered 2021-05-18 – 2021-05-24 (×7): 50 mg via ORAL
  Filled 2021-05-18 (×7): qty 1

## 2021-05-18 NOTE — Evaluation (Signed)
Clinical/Bedside Swallow Evaluation Patient Details  Name: Kirsten Mcmahon MRN: [id number] Date of Birth: Jun 26, 1946  Today's Date: 05/18/2021 Time: SLP Start Time (ACUTE ONLY): 8338 SLP Stop Time (ACUTE ONLY): 0915 SLP Time Calculation (min) (ACUTE ONLY): 17 min  Past Medical History:  Past Medical History:  Diagnosis Date   Cancer ([place]) 2017   Right breast   Depression    GERD (gastroesophageal reflux disease)    Glaucoma    Hemiparesis ([facility])    left side   High cholesterol    History of seizure    x 1 - after a spider bite   History of stroke with residual deficit    left-side weakness   Hypertension    states BP under control with meds., has been on med. x 2 yr.   Hypothyroidism    Non-insulin dependent type 2 diabetes mellitus ([place])    Overactive bladder    Stroke ([place])    1998 weakness on left side   Past Surgical History:  Past Surgical History:  Procedure Laterality Date   ABDOMINAL HYSTERECTOMY     complete   BREAST BIOPSY Left 08/03/2018   Benign adipose tissue   BREAST EXCISIONAL BIOPSY Right 2014   Positive   BREAST LUMPECTOMY Right    BUBBLE STUDY  05/15/2021   Procedure: BUBBLE STUDY;  Surgeon: Jerline Pain, MD;  Location: [facility];  Service: Cardiovascular;;   CATARACT EXTRACTION W/ INTRAOCULAR LENS IMPLANT Left    CEREBRAL ANEURYSM REPAIR  1998   DIALYSIS/PERMA CATHETER INSERTION N/A 04/10/2021   Procedure: DIALYSIS/PERMA CATHETER INSERTION;  Surgeon: Algernon Huxley, MD;  Location: [place] [facility];  Service: Cardiovascular;  Laterality: N/A;   IR FLUORO GUIDE CV LINE RIGHT  04/30/2021   PICC LINE INSERTION     TEE WITHOUT CARDIOVERSION N/A 05/15/2021   Procedure: TRANSESOPHAGEAL ECHOCARDIOGRAM (TEE);  Surgeon: Jerline Pain, MD;  Location: [facility];  Service: Cardiovascular;  Laterality: N/A;   THYROID LOBECTOMY Right 07/15/2017   Procedure: RIGHT THYROID LOBECTOMY;  Surgeon: Armandina Gemma, MD;  Location: [facility];  Service: General;  Laterality:  Right;   HPI:  Pt is a 75 y.o. female presented to ED for AMS and episode of vomiting.  History is obtained from pt's son over the phone. He reports that shortly prior to admission he tried to feed the pt, but she started vomiting dark green material with foul odor and seemed as though she were choking on the emesis. CT head (05/17/21) with no acute findings, CXR (05/17/21) with no acute cardiopulmonary disease, CT abd (05/17/21) with patchy airspace disease R>L lung suspicious for PNA and possible fecal impaction. She was recently admitted for encephalopathy of unclear origin and acute stroke, discharged 05/16/21. PMH: ESRD on HD, HTN, HLD, CVA with residual left-sided hemiparesis, seizure disorder, hypothyroidism,  DM type II, streptococcal bacteremia on IV vancomycin with dialysis, and intermittent encephalopathy. Last BSE (04/21/21) revealed adequate oropharygeal swallow with Dysphagia 2/thin liquid diet recommended. Failed [facility] due to lethargy per RN (05/18/21).   Assessment / Plan / Recommendation Clinical Impression  Pt lethargic this am, but easily awakened for PO trials. Decreased mentation impacted ability to complete thorough oral mechanism examination, however missing dentition noted and labial/lingual strength/ROM appeared grossly functional during PO consumption. Puree and regular textured solids posed no difficulty for pt, except for some prolonged mastication with regular solid. No oral residuals noted, but liquid wash was successful with improving mastication time/bolus formation of regular solid. Pt impulsive, taking very  large sips of thin liquids via straw at a rapid pace. Despite this, no overt s/sx of aspiration oberved. Recommend dysphagia 2/thin liquid diet with staff to provide full supervision for small bites/sips at slow rate, alternation of solids with liquids and upright positioning for all intake. SLP to f/u for tolerance.  SLP Visit Diagnosis: Dysphagia, unspecified (R13.10)     Aspiration Risk  Mild aspiration risk    Diet Recommendation Dysphagia 2 (Fine chop);Thin liquid   Liquid Administration via: Straw;Cup Medication Administration: Crushed with puree Supervision: Full supervision/cueing for compensatory strategies;Staff to assist with self feeding Compensations: Minimize environmental distractions;Slow rate;Small sips/bites;Follow solids with liquid Postural Changes: Seated upright at 90 degrees    Other  Recommendations Oral Care Recommendations: Oral care BID;Staff/trained caregiver to provide oral care   Follow up Recommendations  (TBD)      Frequency and Duration min 2x/week  2 weeks       Prognosis Prognosis for Safe Diet Advancement: Fair Barriers to Reach Goals: Cognitive deficits      Swallow Study   General Date of Onset: 05/17/21 HPI: Pt is a 75 y.o. female presented to ED for AMS and episode of vomiting.  History is obtained from pt's son over the phone. He reports that shortly prior to admission he tried to feed the pt, but she started vomiting dark green material with foul odor and seemed as though she were choking on the emesis. CT head (05/17/21) with no acute findings, CXR (05/17/21) with no acute cardiopulmonary disease, CT abd (05/17/21) with patchy airspace disease R>L lung suspicious for PNA and possible fecal impaction. She was recently admitted for encephalopathy of unclear origin and acute stroke, discharged 05/16/21. PMH: ESRD on HD, HTN, HLD, CVA with residual left-sided hemiparesis, seizure disorder, hypothyroidism,  DM type II, streptococcal bacteremia on IV vancomycin with dialysis, and intermittent encephalopathy. Last BSE (04/21/21) revealed adequate oropharygeal swallow with Dysphagia 2/thin liquid diet recommended. Failed [facility] due to lethargy per RN (05/18/21). Type of Study: Bedside Swallow Evaluation Previous Swallow Assessment: see HPI Diet Prior to this Study: NPO Temperature Spikes Noted: Yes Respiratory Status: Room  air History of Recent Intubation: No Behavior/Cognition: Alert;Cooperative;Requires cueing;Confused;Pleasant mood Oral Cavity Assessment: Within Functional Limits Oral Care Completed by SLP: No Oral Cavity - Dentition: Missing dentition;Poor condition Vision:  (difficult to assess) Self-Feeding Abilities: Able to feed self;Needs assist;Needs set up Patient Positioning: Upright in bed;Postural control interferes with function Baseline Vocal Quality: Normal Volitional Cough: Weak Volitional Swallow: Unable to elicit    Oral/Motor/Sensory Function Overall Oral Motor/Sensory Function: Within functional limits   Ice Chips Ice chips: Not tested   Thin Liquid Thin Liquid: Within functional limits Presentation: Straw    Nectar Thick Nectar Thick Liquid: Not tested   Honey Thick Honey Thick Liquid: Not tested   Puree Puree: Within functional limits Presentation: Spoon   Solid     Solid: Impaired Presentation: Self Fed Oral Phase Impairments: Impaired mastication Oral Phase Functional Implications: Impaired mastication     Ellwood Dense, [place], [place] Office Number: [place] 05/18/2021,9:47 AM

## 2021-05-18 NOTE — Progress Notes (Deleted)
Patient's potassium 2.4. Notified MD.

## 2021-05-18 NOTE — Progress Notes (Addendum)
[facility] PROGRESS NOTE    Progress Note  Kirsten Mcmahon  YPP:[id number] DOB: 07/20/1946 DOA: 05/17/2021 PCP: [place]     Brief Narrative:   Kirsten Mcmahon is an 75 y.o. 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  Patient in the ED he was afebrile with respiration of 25 satting greater 92% on room air White count 23,000 hemoglobin 10, chest x-ray showed no acute findings, CT of the head stable compared to previous.  MRI of the brain was significantly related to motion, showed a few untreated acute infarctions in the right temporal and parietal lobe and bilateral thalamus.  Assessment/Plan:   Acute metabolic encephalopathy: Of unclear etiology. Ammonia  mildly elevated 37 unlikely culprit, satting greater 92%. Continue oxygen precautions recheck energy. PT, OT, Speech consult skilled nursing facility. 2-D echo 04/21/2019 showed an EF of 55% with grade 1 diastolic heart failure. Start patient on ASA 81mg  daily. BP goal: permissive HTN upto 220/120 mmHg Telemetry monitoring.  Acute nausea and vomiting: Chest x-ray showed no acute findings. CT scan of the abdomen no acute process, but it did showed right greater than left patchy airspace disease. Fecal impactation.  SIRS: WBC 23, he has been on Vanc. Blood cultures sent. Started on ZOsyn. Cultures from 5.13 negative till date.  ESRD: Last HD 6.11. Renal notified.  Anemia of chronic disease: Hbg stable compared to previous.  Essential hypertension: Cont metoprolol,  relative well controlled.  Seizure disorder: Cont keppra.  Non-insulin diabetes mellitus: Cont SSI.  Hypothyroidism: Cont synthroid.  Elevated ammonia level: Unlikely to be causing patient symptoms.  Stage II pressure ulcer present on admission: RN Pressure Injury Documentation: Pressure Injury 04/29/21 Buttocks Mid Stage 2 -  Partial thickness loss of dermis presenting as a shallow open injury with a red, pink wound bed without slough. (Active)  04/29/21 0045  Location: Buttocks  Location Orientation: Mid  Staging: Stage 2 -  Partial thickness loss of dermis presenting as a shallow open injury with a red, pink wound bed without slough.  Wound Description (Comments):   Present on Admission: Yes     DVT prophylaxis: lovenox Family Communication:Son Status is: Inpatient  Remains inpatient appropriate because:Hemodynamically unstable  Dispo: The patient is from: Home              Anticipated d/c is to: SNF              Patient currently is not medically stable to d/c.   Difficult to place patient No        Code Status:     Code Status Orders  (From admission, onward)           Start     Ordered   05/17/21 1231  Full code  Continuous        05/17/21 1231           Code Status History     Date Active Date Inactive Code Status Order ID Comments User Context   04/28/2021 1839 05/17/2021 0018 Full Code [id number]  Little Ishikawa, MD ED   04/09/2021 2102  04/24/2021 2217 Full Code [id number]  Elwyn Reach, MD Inpatient   01/17/2021 1921 01/21/2021 2109 Full Code [id number]  Ivor Costa, MD ED   07/15/2017 1640 07/16/2017 2032 Full Code [id number]  Armandina Gemma, MD Inpatient         IV Access:   Peripheral IV   Procedures and diagnostic studies:   CT ABDOMEN PELVIS WO CONTRAST  Result Date: 05/17/2021 CLINICAL DATA:  Altered mental status. Intra-abdominal infection. On antibiotics for bacteremia. End-stage renal disease. Vomiting. EXAM: CT ABDOMEN  AND PELVIS WITHOUT CONTRAST TECHNIQUE: Multidetector CT imaging of the abdomen and pelvis was performed following the standard protocol without IV contrast. COMPARISON:  None. FINDINGS: Lower chest: Right greater than left base patchy airspace disease. Cardiomegaly. Large bore central line terminating at the low right atrium. Hepatobiliary: Multifactorial degradation, including patient arm position and lack of oral or IV contrast. Grossly normal noncontrast appearance of the liver. Multiple gallstones without acute cholecystitis or biliary duct dilatation. Pancreas: Normal, without mass or ductal dilatation. Spleen: Normal in size, without focal abnormality. Adrenals/Urinary Tract: Left greater than right adrenal thickening, without dominant mass. Moderate bilateral renal cortical thinning. Left renal vascular calcifications. An interpolar left renal 1.0 cm low-density lesion is suspected, likely a cyst. No hydronephrosis. No hydroureter or ureteric calculi. No bladder calculi. Stomach/Bowel: Normal stomach, without wall thickening. Stool within the rectum of 6.2 cm. No obstruction. Scattered colonic diverticula. Normal terminal ileum and appendix. Normal small bowel. Vascular/Lymphatic: Aortic atherosclerosis. No abdominopelvic adenopathy. Reproductive: Hysterectomy.  No adnexal mass. Other: No significant free fluid. No free intraperitoneal air. Mild pelvic floor laxity. Multiple soft tissue density nodules within the anterior pelvic wall are likely injection sites. Musculoskeletal: Lumbosacral spondylosis. IMPRESSION: 1. Multifactorial degradation, including patient arm position and lack of oral or IV contrast. 2. Given this factor, no acute process in the abdomen or pelvis. 3. Cholelithiasis. 4. Right greater than left base patchy airspace disease, suspicious for pneumonia. 5. Possible fecal impaction. 6. Aortic Atherosclerosis (ICD10-I70.0). Electronically Signed   By: Abigail Miyamoto M.D.   On: 05/17/2021 14:47    CT Head Wo Contrast  Result Date: 05/17/2021 CLINICAL DATA:  Mental status change with unknown cause. EXAM: CT HEAD WITHOUT CONTRAST TECHNIQUE: Contiguous axial images were obtained from the base of the skull through the vertex without intravenous contrast. COMPARISON:  05/07/2021 FINDINGS: Brain: Patchy remote bilateral cerebellar infarction. Remote right MCA distribution infarct with extensive gliosis and wallerian degeneration seen into the brainstem. Confluent small-vessel ischemic type gliosis in the cerebral white matter. No hemorrhage, hydrocephalus, or masslike finding. Vascular: No hyperdense vessel or unexpected calcification. Skull: Right craniectomy, presumably for cytotoxic edema decompression, with stable sunken appearance. Sinuses/Orbits: Negative IMPRESSION: 1. Stable compared to recent CT. Acute infarcts by brain MRI are occult by CT. 2. Extensive chronic ischemic injury. Electronically Signed   By: Monte Fantasia M.D.   On: 05/17/2021 09:25   DG Chest Port 1 View  Result Date: 05/17/2021 CLINICAL DATA:  Altered mental status. EXAM: PORTABLE CHEST 1 VIEW COMPARISON:  05/04/2021 FINDINGS: Right IJ dialysis catheter unchanged with tip over the right atrium. Patient is slightly rotated to the right. Lungs are adequately inflated and otherwise clear. Mild stable cardiomegaly. Remainder of the exam is unchanged. IMPRESSION: 1. No acute cardiopulmonary disease. 2. Mild stable cardiomegaly. Electronically Signed   By: Marin Olp M.D.   On: 05/17/2021 08:43   VAS [place] LOWER EXTREMITY VENOUS (DVT)  Result Date: 05/16/2021  Lower Venous DVT Study Patient Name:  Kemiyah  Eligah East  Date of Exam:   05/16/2021 Medical Rec #: [id number]          Accession #:    [id number] Date of Birth: Jun 13, 1946           Patient Gender: F Patient Age:   57Y Exam Location:  [facility] Procedure:      VAS [place] LOWER EXTREMITY VENOUS (DVT) Referring Phys: [id number] PRAMOD S SETHI  --------------------------------------------------------------------------------  Indications: Stroke, and Positive PFO by TEE.  Limitations: Patient in reclining chair. Altered mental status. Comparison Study: Prior negative Left LEV done 04/10/21 and 12/26/16, are available                   for comparison Performing Technologist: Sharion Dove RVS  Examination Guidelines: A complete evaluation includes B-mode imaging, spectral Doppler, color Doppler, and power Doppler as needed of all accessible portions of each vessel. Bilateral testing is considered an integral part of a complete examination. Limited examinations for reoccurring indications may be performed as noted. The reflux portion of the exam is performed with the patient in reverse Trendelenburg.  +---------+---------------+---------+-----------+----------+-------------------+ RIGHT    CompressibilityPhasicitySpontaneityPropertiesThrombus Aging      +---------+---------------+---------+-----------+----------+-------------------+ CFV      Full           Yes      Yes                                      +---------+---------------+---------+-----------+----------+-------------------+ SFJ      Full                                                             +---------+---------------+---------+-----------+----------+-------------------+ FV Prox  Full                                                             +---------+---------------+---------+-----------+----------+-------------------+ FV Mid   Full                                                             +---------+---------------+---------+-----------+----------+-------------------+ FV DistalFull                                                             +---------+---------------+---------+-----------+----------+-------------------+ PFV                     Yes      Yes                                       +---------+---------------+---------+-----------+----------+-------------------+ POP  Full           Yes      Yes                                      +---------+---------------+---------+-----------+----------+-------------------+ PTV      Full                                                             +---------+---------------+---------+-----------+----------+-------------------+ PERO                                                  Not well visualized +---------+---------------+---------+-----------+----------+-------------------+   +---------+---------------+---------+-----------+----------+-------------------+ LEFT     CompressibilityPhasicitySpontaneityPropertiesThrombus Aging      +---------+---------------+---------+-----------+----------+-------------------+ CFV                     Yes      Yes                  patent by color and                                                       Doppler             +---------+---------------+---------+-----------+----------+-------------------+ FV Prox                 Yes      Yes                  patent by color and                                                       Doppler             +---------+---------------+---------+-----------+----------+-------------------+ FV Mid                  Yes      Yes                  patent by color and                                                       Doppler             +---------+---------------+---------+-----------+----------+-------------------+ FV Distal               Yes      Yes                  patent by color and  Doppler             +---------+---------------+---------+-----------+----------+-------------------+ PFV                                                   Not well visualized +---------+---------------+---------+-----------+----------+-------------------+  POP                     Yes      Yes                  patent by color and                                                       Doppler             +---------+---------------+---------+-----------+----------+-------------------+ PTV                                                   Not well visualized +---------+---------------+---------+-----------+----------+-------------------+ PERO                                                  Not well visualized +---------+---------------+---------+-----------+----------+-------------------+     Summary: RIGHT: - There is no evidence of deep vein thrombosis in the lower extremity. However, portions of this examination were limited- see technologist comments above.  LEFT: - There is no evidence of deep vein thrombosis in the lower extremity. However, portions of this examination were limited- see technologist comments above.  *See table(s) above for measurements and observations. Electronically signed by Jamelle Haring on 05/16/2021 at 7:17:31 PM.    Final      Medical Consultants:   None.   Subjective:    [name] able to answer simple commmands  Objective:    Vitals:   05/18/21 0000 05/18/21 0400 05/18/21 0748 05/18/21 1100  BP: (!) 160/102 (!) 137/92 (!) 146/95   Pulse: (!) 106 (!) 107 (!) 103   Resp: 20 20 20    Temp: 97.6 F (36.4 C) 97.7 F (36.5 C) 98.4 F (36.9 C)   TempSrc: Axillary Oral Axillary   SpO2: 97% 97% 96%   Height:    5\' 2"  (1.575 m)   SpO2: 96 %   Intake/Output Summary (Last 24 hours) at 05/18/2021 1122 Last data filed at 05/18/2021 1101 Gross per 24 hour  Intake 1136.59 ml  Output --  Net 1136.59 ml   There were no vitals filed for this visit.  Exam: General exam: In no acute distress. Respiratory system: Good air movement and clear to auscultation. Cardiovascular system: S1 & S2 heard, RRR. No JVD, murmurs, rubs, gallops or clicks.  Gastrointestinal system: Abdomen is  nondistended, soft and nontender.  Extremities: No pedal edema. Skin: No rashes, lesions or ulcers Psychiatry: Judgement and insight appear normal. Mood & affect appropriate.    Data Reviewed:    Labs: Basic Metabolic Panel: Recent Labs  Lab 05/13/21 1416  05/15/21 1117 05/16/21 0714 05/17/21 0808 05/17/21 0919 05/17/21 1146 05/18/21 0112  NA 136 137 137 139  --  141 141  K 3.4* 4.1 4.0 4.9  --  4.5 4.1  CL 97* 101 101 100  --   --  102  CO2 28  --  29 24  --   --  25  GLUCOSE 197* 100* 104* 191*  --   --  139*  BUN 30* 27* 11 29*  --   --  37*  CREATININE 5.59* 5.40* 2.47* 4.70*  --   --  5.24*  CALCIUM 8.6*  --  7.8* 9.3  --   --  9.0  PHOS 3.6  --  1.7*  --  3.9  --  4.7*   GFR Estimated Creatinine Clearance: 7.2 mL/min (A) (by C-G formula based on SCr of 5.24 mg/dL (H)). Liver Function Tests: Recent Labs  Lab 05/13/21 1416 05/16/21 0714 05/17/21 0808 05/18/21 0112  AST  --   --  28  --   ALT  --   --  16  --   ALKPHOS  --   --  87  --   BILITOT  --   --  1.8*  --   PROT  --   --  7.9  --   ALBUMIN 2.6* 2.6* 3.5 3.0*   Recent Labs  Lab 05/17/21 0808  LIPASE 26   Recent Labs  Lab 05/17/21 0808  AMMONIA 37*   Coagulation profile No results for input(s): INR, PROTIME in the last 168 hours. COVID-19 Labs  No results for input(s): DDIMER, FERRITIN, LDH, CRP in the last 72 hours.  Lab Results  Component Value Date   SARSCOV2NAA NEGATIVE 05/17/2021   [place] NEGATIVE 04/28/2021   [place] NEGATIVE 04/09/2021   [place] NEGATIVE 01/17/2021    CBC: Recent Labs  Lab 05/13/21 1416 05/15/21 1117 05/16/21 0714 05/17/21 0808 05/17/21 1146 05/18/21 0112  WBC 7.3  --  8.1 23.6*  --  16.4*  NEUTROABS  --   --   --  22.0*  --   --   HGB 8.2* 7.5* 7.1* 10.2* 9.9* 9.0*  HCT 26.5* 22.0* 23.8* 32.3* 29.0* 28.2*  MCV 92.3  --  95.2 92.6  --  92.2  PLT 251  --  291 202  --  220   Cardiac Enzymes: No results for input(s): CKTOTAL, CKMB,  CKMBINDEX, TROPONINI in the last 168 hours. BNP (last 3 results) No results for input(s): PROBNP in the last 8760 hours. CBG: Recent Labs  Lab 05/17/21 1813 05/17/21 2017 05/17/21 2340 05/18/21 0410 05/18/21 0823  GLUCAP 158* 146* 126* 125* 141*   D-Dimer: No results for input(s): DDIMER in the last 72 hours. Hgb A1c: No results for input(s): HGBA1C in the last 72 hours. Lipid Profile: No results for input(s): CHOL, HDL, LDLCALC, TRIG, CHOLHDL, LDLDIRECT in the last 72 hours. Thyroid function studies: No results for input(s): TSH, T4TOTAL, T3FREE, THYROIDAB in the last 72 hours.  Invalid input(s): FREET3 Anemia work up: No results for input(s): VITAMINB12, FOLATE, FERRITIN, TIBC, IRON, RETICCTPCT in the last 72 hours. Sepsis Labs: Recent Labs  Lab 05/13/21 1416 05/16/21 0714 05/17/21 0808 05/17/21 0936 05/17/21 1138 05/18/21 0112  WBC 7.3 8.1 23.6*  --   --  16.4*  LATICACIDVEN  --   --   --  1.8 1.4  --    Microbiology Recent Results (from the past 240 hour(s))  Culture, blood (routine x 2)  Status: None (Preliminary result)   Collection Time: 05/17/21  9:48 AM   Specimen: BLOOD LEFT FOREARM  Result Value Ref Range Status   Specimen Description BLOOD LEFT FOREARM  Final   Special Requests   Final    BOTTLES DRAWN AEROBIC AND ANAEROBIC Blood Culture adequate volume   Culture   Final    NO GROWTH < 24 HOURS Performed at [facility], [address], [place], [place] [address]    Report Status PENDING  Incomplete  Culture, blood (routine x 2)     Status: None (Preliminary result)   Collection Time: 05/17/21  9:53 AM   Specimen: BLOOD LEFT FOREARM  Result Value Ref Range Status   Specimen Description BLOOD LEFT FOREARM  Final   Special Requests   Final    BOTTLES DRAWN AEROBIC AND ANAEROBIC Blood Culture adequate volume   Culture   Final    NO GROWTH < 24 HOURS Performed at [facility], [place] [address], [place], [place] [address]    Report  Status PENDING  Incomplete  Resp Panel by RT-PCR (Flu A&B, Covid) Nasopharyngeal Swab     Status: None   Collection Time: 05/17/21 11:28 AM   Specimen: Nasopharyngeal Swab; Nasopharyngeal(NP) swabs in vial transport medium  Result Value Ref Range Status   SARS Coronavirus 2 by RT PCR NEGATIVE NEGATIVE Final    Comment: (NOTE) SARS-CoV-2 target nucleic acids are NOT DETECTED.  The SARS-CoV-2 RNA is generally detectable in upper respiratory specimens during the acute phase of infection. The lowest concentration of SARS-CoV-2 viral copies this assay can detect is 138 copies/mL. A negative result does not preclude SARS-Cov-2 infection and should not be used as the sole basis for treatment or other patient management decisions. A negative result may occur with  improper specimen collection/handling, submission of specimen other than nasopharyngeal swab, presence of viral mutation(s) within the areas targeted by this assay, and inadequate number of viral copies(<138 copies/mL). A negative result must be combined with clinical observations, patient history, and epidemiological information. The expected result is Negative.  Fact Sheet for Patients:  EntrepreneurPulse.com.au  Fact Sheet for Healthcare Providers:  IncredibleEmployment.be  This test is no t yet approved or cleared by the [place] FDA and  has been authorized for detection and/or diagnosis of SARS-CoV-2 by FDA under an Emergency Use Authorization (EUA). This EUA will remain  in effect (meaning this test can be used) for the duration of the COVID-19 declaration under Section 564(b)(1) of the Act, 21 U.S.C.section 360bbb-3(b)(1), unless the authorization is terminated  or revoked sooner.       Influenza A by PCR NEGATIVE NEGATIVE Final   Influenza B by PCR NEGATIVE NEGATIVE Final    Comment: (NOTE) The Xpert Xpress SARS-CoV-2/FLU/RSV plus assay is intended as an aid in the diagnosis  of influenza from Nasopharyngeal swab specimens and should not be used as a sole basis for treatment. Nasal washings and aspirates are unacceptable for Xpert Xpress SARS-CoV-2/FLU/RSV testing.  Fact Sheet for Patients: EntrepreneurPulse.com.au  Fact Sheet for Healthcare Providers: IncredibleEmployment.be  This test is not yet approved or cleared by the [place] FDA and has been authorized for detection and/or diagnosis of SARS-CoV-2 by FDA under an Emergency Use Authorization (EUA). This EUA will remain in effect (meaning this test can be used) for the duration of the COVID-19 declaration under Section 564(b)(1) of the Act, 21 U.S.C. section 360bbb-3(b)(1), unless the authorization is terminated or revoked.  Performed at [facility], [place] [address] [place], [place] [address]      Medications:    Chlorhexidine Gluconate Cloth  6 each Topical Q0600   darbepoetin (ARANESP) injection - DIALYSIS  100 mcg Intravenous Q Mon-HD   heparin  5,000 Units Subcutaneous Q8H   insulin aspart  0-6 Units Subcutaneous Q4H   sodium chloride flush  3 mL Intravenous Q12H   Continuous Infusions:  sodium chloride 75 mL/hr at 05/18/21 1101   levETIRAcetam Stopped (05/18/21 0841)   piperacillin-tazobactam (ZOSYN)  IV Stopped (05/18/21 0616)      LOS: 1 day   Charlynne Cousins  [facility]  05/18/2021, 11:22 AM

## 2021-05-18 NOTE — Progress Notes (Addendum)
[facility] Progress Note   Subjective: Seen in room. Not responding to verbal stimuli, withdraws slightly from noxious stimuli. HD today on schedule     Objective Vitals:   05/18/21 0400 05/18/21 0748 05/18/21 1100 05/18/21 1121  BP: (!) 137/92 (!) 146/95    Pulse: (!) 107 (!) 103    Resp: 20 20    Temp: 97.7 F (36.5 C) 98.4 F (36.9 C)    TempSrc: Oral Axillary    SpO2: 97% 96%    Weight:    50.7 kg  Height:   5\' 2"  (1.575 m)    Physical Exam General: chronically ill appearing female in NAD Heart: S1,S2 RRR No M/R/G ST HR 107 Lungs: CTAB slightly decreased in R base Abdomen: S, NT, ND Extremities: edema L hand, no LE edema Dialysis Access: RIJ [facility] Drsg intact     Additional Objective Labs: Basic Metabolic Panel: Recent Labs  Lab 05/16/21 0714 05/17/21 0808 05/17/21 0919 05/17/21 1146 05/18/21 0112  NA 137 139  --  141 141  K 4.0 4.9  --  4.5 4.1  CL 101 100  --   --  102  CO2 29 24  --   --  25  GLUCOSE 104* 191*  --   --  139*  BUN 11 29*  --   --  37*  CREATININE 2.47* 4.70*  --   --  5.24*  CALCIUM 7.8* 9.3  --   --  9.0  PHOS 1.7*  --  3.9  --  4.7*   Liver Function Tests: Recent Labs  Lab 05/16/21 0714 05/17/21 0808 05/18/21 0112  AST  --  28  --   ALT  --  16  --   ALKPHOS  --  87  --   BILITOT  --  1.8*  --   PROT  --  7.9  --   ALBUMIN 2.6* 3.5 3.0*   Recent Labs  Lab 05/17/21 0808  LIPASE 26   CBC: Recent Labs  Lab 05/13/21 1416 05/15/21 1117 05/16/21 0714 05/17/21 0808 05/17/21 1146 05/18/21 0112  WBC 7.3  --  8.1 23.6*  --  16.4*  NEUTROABS  --   --   --  22.0*  --   --   HGB 8.2*   < > 7.1* 10.2* 9.9* 9.0*  HCT 26.5*   < > 23.8* 32.3* 29.0* 28.2*  MCV 92.3  --  95.2 92.6  --  92.2  PLT 251  --  291 202  --  220   < > = values in this interval not displayed.   Blood Culture    Component Value Date/Time   SDES BLOOD LEFT FOREARM 05/17/2021 0953   SPECREQUEST  05/17/2021 0953    BOTTLES DRAWN AEROBIC  AND ANAEROBIC Blood Culture adequate volume   CULT  05/17/2021 0953    NO GROWTH < 24 HOURS Performed at [facility], [place] [address] [place], [place] [address]    REPTSTATUS PENDING 05/17/2021 8295    Cardiac Enzymes: No results for input(s): CKTOTAL, CKMB, CKMBINDEX, TROPONINI in the last 168 hours. CBG: Recent Labs  Lab 05/17/21 1813 05/17/21 2017 05/17/21 2340 05/18/21 0410 05/18/21 0823  GLUCAP 158* 146* 126* 125* 141*   Iron Studies: No results for input(s): IRON, TIBC, TRANSFERRIN, FERRITIN in the last 72 hours. @lablastinr3 @ Studies/Results: CT ABDOMEN PELVIS WO CONTRAST  Result Date: 05/17/2021 CLINICAL DATA:  Altered mental status. Intra-abdominal infection. On antibiotics for bacteremia. End-stage renal  disease. Vomiting. EXAM: CT ABDOMEN AND PELVIS WITHOUT CONTRAST TECHNIQUE: Multidetector CT imaging of the abdomen and pelvis was performed following the standard protocol without IV contrast. COMPARISON:  None. FINDINGS: Lower chest: Right greater than left base patchy airspace disease. Cardiomegaly. Large bore central line terminating at the low right atrium. Hepatobiliary: Multifactorial degradation, including patient arm position and lack of oral or IV contrast. Grossly normal noncontrast appearance of the liver. Multiple gallstones without acute cholecystitis or biliary duct dilatation. Pancreas: Normal, without mass or ductal dilatation. Spleen: Normal in size, without focal abnormality. Adrenals/Urinary Tract: Left greater than right adrenal thickening, without dominant mass. Moderate bilateral renal cortical thinning. Left renal vascular calcifications. An interpolar left renal 1.0 cm low-density lesion is suspected, likely a cyst. No hydronephrosis. No hydroureter or ureteric calculi. No bladder calculi. Stomach/Bowel: Normal stomach, without wall thickening. Stool within the rectum of 6.2 cm. No obstruction. Scattered colonic diverticula. Normal terminal ileum and  appendix. Normal small bowel. Vascular/Lymphatic: Aortic atherosclerosis. No abdominopelvic adenopathy. Reproductive: Hysterectomy.  No adnexal mass. Other: No significant free fluid. No free intraperitoneal air. Mild pelvic floor laxity. Multiple soft tissue density nodules within the anterior pelvic wall are likely injection sites. Musculoskeletal: Lumbosacral spondylosis. IMPRESSION: 1. Multifactorial degradation, including patient arm position and lack of oral or IV contrast. 2. Given this factor, no acute process in the abdomen or pelvis. 3. Cholelithiasis. 4. Right greater than left base patchy airspace disease, suspicious for pneumonia. 5. Possible fecal impaction. 6. Aortic Atherosclerosis (ICD10-I70.0). Electronically Signed   By: Abigail Miyamoto M.D.   On: 05/17/2021 14:47   CT Head Wo Contrast  Result Date: 05/17/2021 CLINICAL DATA:  Mental status change with unknown cause. EXAM: CT HEAD WITHOUT CONTRAST TECHNIQUE: Contiguous axial images were obtained from the base of the skull through the vertex without intravenous contrast. COMPARISON:  05/07/2021 FINDINGS: Brain: Patchy remote bilateral cerebellar infarction. Remote right MCA distribution infarct with extensive gliosis and wallerian degeneration seen into the brainstem. Confluent small-vessel ischemic type gliosis in the cerebral white matter. No hemorrhage, hydrocephalus, or masslike finding. Vascular: No hyperdense vessel or unexpected calcification. Skull: Right craniectomy, presumably for cytotoxic edema decompression, with stable sunken appearance. Sinuses/Orbits: Negative IMPRESSION: 1. Stable compared to recent CT. Acute infarcts by brain MRI are occult by CT. 2. Extensive chronic ischemic injury. Electronically Signed   By: Monte Fantasia M.D.   On: 05/17/2021 09:25   DG Chest Port 1 View  Result Date: 05/17/2021 CLINICAL DATA:  Altered mental status. EXAM: PORTABLE CHEST 1 VIEW COMPARISON:  05/04/2021 FINDINGS: Right IJ dialysis  catheter unchanged with tip over the right atrium. Patient is slightly rotated to the right. Lungs are adequately inflated and otherwise clear. Mild stable cardiomegaly. Remainder of the exam is unchanged. IMPRESSION: 1. No acute cardiopulmonary disease. 2. Mild stable cardiomegaly. Electronically Signed   By: Marin Olp M.D.   On: 05/17/2021 08:43   VAS [place] LOWER EXTREMITY VENOUS (DVT)  Result Date: 05/16/2021  Lower Venous DVT Study Patient Name:  RENNIE HACK  Date of Exam:   05/16/2021 Medical Rec #: [id number]          Accession #:    [id number] Date of Birth: 1946-06-10           Patient Gender: F Patient Age:   78Y Exam Location:  [facility] Procedure:      VAS [place] LOWER EXTREMITY VENOUS (DVT) Referring Phys: [id number] PRAMOD S SETHI --------------------------------------------------------------------------------  Indications: Stroke, and Positive PFO by TEE.  Limitations: Patient in reclining chair. Altered mental status. Comparison Study: Prior negative Left LEV done 04/10/21 and 12/26/16, are available                   for comparison Performing Technologist: Sharion Dove RVS  Examination Guidelines: A complete evaluation includes B-mode imaging, spectral Doppler, color Doppler, and power Doppler as needed of all accessible portions of each vessel. Bilateral testing is considered an integral part of a complete examination. Limited examinations for reoccurring indications may be performed as noted. The reflux portion of the exam is performed with the patient in reverse Trendelenburg.  +---------+---------------+---------+-----------+----------+-------------------+ RIGHT    CompressibilityPhasicitySpontaneityPropertiesThrombus Aging      +---------+---------------+---------+-----------+----------+-------------------+ CFV      Full           Yes      Yes                                      +---------+---------------+---------+-----------+----------+-------------------+ SFJ      Full                                                              +---------+---------------+---------+-----------+----------+-------------------+ FV Prox  Full                                                             +---------+---------------+---------+-----------+----------+-------------------+ FV Mid   Full                                                             +---------+---------------+---------+-----------+----------+-------------------+ FV DistalFull                                                             +---------+---------------+---------+-----------+----------+-------------------+ PFV                     Yes      Yes                                      +---------+---------------+---------+-----------+----------+-------------------+ POP      Full           Yes      Yes                                      +---------+---------------+---------+-----------+----------+-------------------+ PTV      Full                                                             +---------+---------------+---------+-----------+----------+-------------------+  PERO                                                  Not well visualized +---------+---------------+---------+-----------+----------+-------------------+   +---------+---------------+---------+-----------+----------+-------------------+ LEFT     CompressibilityPhasicitySpontaneityPropertiesThrombus Aging      +---------+---------------+---------+-----------+----------+-------------------+ CFV                     Yes      Yes                  patent by color and                                                       Doppler             +---------+---------------+---------+-----------+----------+-------------------+ FV Prox                 Yes      Yes                  patent by color and                                                       Doppler              +---------+---------------+---------+-----------+----------+-------------------+ FV Mid                  Yes      Yes                  patent by color and                                                       Doppler             +---------+---------------+---------+-----------+----------+-------------------+ FV Distal               Yes      Yes                  patent by color and                                                       Doppler             +---------+---------------+---------+-----------+----------+-------------------+ PFV                                                   Not well visualized +---------+---------------+---------+-----------+----------+-------------------+ POP  Yes      Yes                  patent by color and                                                       Doppler             +---------+---------------+---------+-----------+----------+-------------------+ PTV                                                   Not well visualized +---------+---------------+---------+-----------+----------+-------------------+ PERO                                                  Not well visualized +---------+---------------+---------+-----------+----------+-------------------+     Summary: RIGHT: - There is no evidence of deep vein thrombosis in the lower extremity. However, portions of this examination were limited- see technologist comments above.  LEFT: - There is no evidence of deep vein thrombosis in the lower extremity. However, portions of this examination were limited- see technologist comments above.  *See table(s) above for measurements and observations. Electronically signed by Jamelle Haring on 05/16/2021 at 7:17:31 PM.    Final    Medications:  sodium chloride 75 mL/hr at 05/18/21 1101   levETIRAcetam Stopped (05/18/21 0841)   piperacillin-tazobactam (ZOSYN)  IV Stopped (05/18/21 7591)     Chlorhexidine Gluconate Cloth  6 each Topical Q0600   darbepoetin (ARANESP) injection - DIALYSIS  100 mcg Intravenous Q Mon-HD   heparin  5,000 Units Subcutaneous Q8H   insulin aspart  0-6 Units Subcutaneous Q4H   sodium chloride flush  3 mL Intravenous Q12H     Dialysis Orders: MWF  - GOC  4hrs, BFR 400, DFR 500,  EDW 55kg, 2K/ 2.5Ca   Access: TDC  Heparin none Mircera 88mcg IV q2wks - not given, to start 6/13   Assessment/Plan: 1. AMS - previous admission for same. Etiology unclear, likely multifactorial.  Recent acute CVA which neurology did not think was origin.  CT with possible PNA. WBC 23.6. Recent bacteremia with no evidence of vegetations on TEE.  Continued GOC discussion with family would be beneficial. 2. Possible PNA - CT with air space disease, ?aspiration, family reported vomiting overnight. ABX started. Per primary 3. Leukocytosis - 23.6. CT with possible PNA.  N/v overnight.  On vanc/zosyn 4. Nausea/vomiting - per family vomiting over night. Work up per primary. 5. ESRD - on HD MWF.  Orders written for HD 05/18/21 per regular schedule. Requires sitter at outpatient dialysis due to behavior/safety issues. 6. Anemia of CKD- Hgb 10.2. ESA due tomorrow. Will order.  1 unit pRBC given last admission with HD 6/11. Consider ruling out GIB. 7. Secondary hyperparathyroidism - Ca and phos in goal.  Not on VDRA or binders. 8. HTN/volume - Blood pressure currently elevated. Has had hypotension on HD.  Previously using midodrine and albumin for BP support. Will hold midodrine for now. UF as tolerated. 9. Nutrition - Renal diet with fluid restrictions once advanced. 10. Sterptococcal bacteremia -  noted on previous admission.  On Vanc with HD until 6/27.  TEE on 6/10 with no vegetations, +PFO. 11. Seizure disorder - on keppra 12. DMT2 - per primary    Khush Pasion H. Mabel Unrein NP-C 05/18/2021, 11:36 AM  [employer] [phone]

## 2021-05-18 NOTE — Evaluation (Signed)
Physical Therapy Evaluation Patient Details Name: Kirsten Mcmahon MRN: [id number] DOB: 1946/07/01 Today's Date: 05/18/2021   History of Present Illness  75yo female admitted 05/17/21 with family reports of AMS and vomiting. Of note, was recently discharged home on 6/11. PMH breast CA, MCA CVA 1998 with L residual hemiplegia, HLD, seizures,HTN, DM, new CVAs found L temporal lobe/B thalamus/L parietal lobes 05/12/21  Clinical Impression   Patient received in bed. She is very well known to this therapist and seems to be at her baseline level of function based on past hospitalization. Continues to have right gaze preference but is able to turn her head and eyes to the left when cued. Continues to demonstrate L sided chronic hemiplegia, as well as extensor tone on the left. Able to try to use R UE to correct position in bed a bit today but inconsistent. Difficult to really assess strength and mobility due to cognitive limitations.   Unfortunately at this point I do suspect she is at her new baseline, and would benefit from custodial care; seems unlikely to make significant progress with skilled PT services at this point. Would really benefit from SNF placement- however she is a [facility] participant. If SNF is not an option, strongly recommend 24/7 in home care from appropriate staff, equipment as below, and custom WC consult. See my prior notes on custom WC and positioning recommendations.   PT signing off, thank you for the opportunity to participate in her care!     Follow Up Recommendations SNF;Supervision/Assistance - 24 hour;Other (comment) ([facility] participant- if SNF is not an option, will need 24/7 support from appropriately trained staff in home)    [facility] bed;Wheelchair (measurements PT);Wheelchair cushion (measurements PT);Other (comment) (hoyer lift and pads)    Recommendations for Other Services       Precautions / Restrictions Precautions Precautions: Fall;Other  (comment) Precaution Comments: R perm-cath; history of R fronto-temporal craniectomy with musculocutaneous flap over site. L hemiparesis and truncal tone Restrictions Weight Bearing Restrictions: No      Mobility  Bed Mobility Overal bed mobility: Needs Assistance Bed Mobility: Rolling Rolling: Total assist         General bed mobility comments: at baseline with totalAx1-2 for all aspects of mobility.    Transfers                 General transfer comment: hoyer lift at baseline  Ambulation/Gait             General Gait Details: unable  Stairs            Wheelchair Mobility    Modified Rankin (Stroke Patients Only)       Balance                                             Pertinent Vitals/Pain Pain Assessment: Faces Faces Pain Scale: No hurt Pain Intervention(s): Limited activity within patient's tolerance;Monitored during session    Thomas expects to be discharged to:: Private residence Living Arrangements: Children Available Help at Discharge: Available PRN/intermittently;Family;Other (Comment) (support services from P[facility]) Type of Home: House Home Access: Ramped entrance     Home Layout: One level Home Equipment: Shower seat;Wheelchair - manual Additional Comments: Patient limited historian; information obtained from the chart    Prior Function Level of Independence: Needs assistance   Gait / Transfers Assistance Needed:  primary means of mobility in wheelchair  ADL's / Homemaking Assistance Needed: required assistance for ADLs  Comments: Patient limited historian; information obtained from chart.  WC level as primary mobility, assist for ADLs and transfers between seating surfaces.  Lives with son and receives support services from PACE program.     Hand Dominance   Dominant Hand: Right    Extremity/Trunk Assessment   Upper Extremity Assessment Upper Extremity Assessment: Defer to OT  evaluation    Lower Extremity Assessment Lower Extremity Assessment: RLE deficits/detail;LLE deficits/detail RLE Deficits / Details: generalized muscle weakness and joint stiffness- very deconditioned. Difficult to assess overall due to cognition/cue following ability LLE Deficits / Details: L LE grossly hemiplegic, seems to have significant extensor tone LLE Sensation: decreased proprioception LLE Coordination: decreased gross motor    Cervical / Trunk Assessment Cervical / Trunk Assessment: Other exceptions Cervical / Trunk Exceptions: R lateral lean with R gaze preference but able to turn head and eyes to left with max cues. May have some L inattention.  Communication   Communication: Other (comment) (minimal verbalizations. able to answer yes/no questions.)  Cognition Arousal/Alertness: Awake/alert Behavior During Therapy: Flat affect Overall Cognitive Status: No family/caregiver present to determine baseline cognitive functioning                                 General Comments: pleasant but only minimally verbal- eitehr did not answer questions or generally said "yes". Would often start saying something then trail off. Delayed responses and impaired processing.      General Comments General comments (skin integrity, edema, etc.): did not assess EOB today- HR tachycardic up to 115BPM at rest. Historically, needs from min guard to [place] for static sitting balance.    Exercises     Assessment/Plan    PT Assessment All further PT needs can be met in the next venue of care  PT Problem List Decreased strength;Decreased range of motion;Decreased activity tolerance;Decreased balance;Decreased mobility;Decreased coordination;Decreased cognition;Decreased knowledge of use of DME;Decreased safety awareness;Decreased knowledge of precautions       PT Treatment Interventions Therapeutic activities;Therapeutic exercise    PT Goals (Current goals can be found in the Care  Plan section)  Acute Rehab PT Goals Patient Stated Goal: none stated PT Goal Formulation: Patient unable to participate in goal setting Time For Goal Achievement: 06/01/21 Potential to Achieve Goals: Poor    Frequency Other (Comment) (Eval only)   Barriers to discharge        Co-evaluation               AM-PAC PT "6 Clicks" Mobility  Outcome Measure Help needed turning from your back to your side while in a flat bed without using bedrails?: Total Help needed moving from lying on your back to sitting on the side of a flat bed without using bedrails?: Total Help needed moving to and from a bed to a chair (including a wheelchair)?: Total Help needed standing up from a chair using your arms (e.g., wheelchair or bedside chair)?: Total Help needed to walk in hospital room?: Total Help needed climbing 3-5 steps with a railing? : Total 6 Click Score: 6    End of Session   Activity Tolerance: Patient tolerated treatment well Patient left: in bed;with call bell/phone within reach;with bed alarm set Nurse Communication: Mobility status PT Visit Diagnosis: Muscle weakness (generalized) (M62.81);Difficulty in walking, not elsewhere classified (R26.2);Hemiplegia and hemiparesis;Other abnormalities of gait and  mobility (R26.89);Adult, failure to thrive (R62.7) Hemiplegia - Right/Left: Left Hemiplegia - dominant/non-dominant: Non-dominant Hemiplegia - caused by: Cerebral infarction    Time: 7017-7939 PT Time Calculation (min) (ACUTE ONLY): 14 min   Charges:   PT Evaluation $PT Eval Moderate Complexity: 1 Mod         Windell Norfolk, DPT, PN1   Supplemental Physical Therapist [place]    Pager [phone] Acute Rehab Office [phone]

## 2021-05-18 NOTE — Evaluation (Signed)
Occupational Therapy Evaluation Patient Details Name: Kirsten Mcmahon MRN: [id number] DOB: 03-Jun-1946 Today's Date: 05/18/2021    History of Present Illness 75yo female admitted 05/17/21 with family reports of AMS and vomiting. Of note, was recently discharged home on 6/11. 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 No further acute OT services are indicated at this time, OT will sign off    Follow Up Recommendations  Supervision/Assistance - 24 hour;SNF    Equipment Recommendations  3 in 1 bedside commode;Hospital bed    Recommendations for Other Services       Precautions / Restrictions Precautions Precautions: Fall;Other (comment) Precaution Comments: R perm-cath; history of R fronto-temporal craniectomy with musculocutaneous flap over site. L hemiparesis and truncal tone Restrictions Weight Bearing Restrictions: No      Mobility Bed Mobility Overal bed mobility: Needs Assistance Bed Mobility: Rolling Rolling: Total assist   Supine to sit: Total assist Sit to supine: Total assist   General bed mobility comments: at baseline    Transfers                 General transfer comment: hoyer lift at baseline    Balance                                           ADL either performed or assessed with clinical judgement   ADL Overall ADL's : Needs assistance/impaired                                       General ADL Comments: total A with eating and selfcare, max hand over hand assist with cues to wash face     Vision Baseline Vision/History:  (unable to properly assess due to cognition)       Perception     Praxis      Pertinent Vitals/Pain Pain Assessment: Faces Faces Pain Scale: No hurt Pain  Intervention(s): Monitored during session;Repositioned     Hand Dominance Right   Extremity/Trunk Assessment Upper Extremity Assessment Upper Extremity Assessment: Generalized weakness;LUE deficits/detail LUE Deficits / Details: impaired tone from previous CVA, edema. Pt with palm protector donned with L UE elevated with one pillow   Lower Extremity Assessment Lower Extremity Assessment: Defer to PT evaluation   Cervical / Trunk Assessment Cervical / Trunk Assessment: Other exceptions Cervical / Trunk Exceptions: R lateral lean with R gaze preference but able to turn head and eyes to left with max cues. May have some L inattention.   Communication Communication Communication: Other (comment) (minimally verbal)   Cognition Arousal/Alertness: Awake/alert Behavior During Therapy: Flat affect Overall Cognitive Status: No family/caregiver present to determine baseline cognitive functioning                                 General Comments: pleasant but only minimally verbal- eitehr did not answer questions or generally said "yes". Would often start saying something then trail off. Delayed responses and impaired processing.   General Comments       Exercises  Shoulder Instructions      Home Living Family/patient expects to be discharged to:: Private residence Living Arrangements: Children Available Help at Discharge: Available PRN/intermittently;Family;Other (Comment) ([facility]) Type of Home: House Home Access: Ramped entrance     [place]: One level     Bathroom Shower/Tub: Tub/shower unit         Home Equipment: Shower seat;Wheelchair - manual   Additional Comments: Patient limited historian; information obtained from the chart      Prior Functioning/Environment Level of Independence: Needs assistance  Gait / Transfers Assistance Needed: primary means of mobility in wheelchair ADL's / Homemaking Assistance Needed: required assistance  for ADLs   Comments: Patient limited historian; information obtained from chart.  WC level as primary mobility, assist for ADLs and transfers between seating surfaces.  Lives with son and receives support services from [facility].        OT Problem List: Decreased strength;Decreased range of motion;Impaired balance (sitting and/or standing);Impaired vision/perception;Decreased coordination;Decreased cognition;Decreased safety awareness;Decreased knowledge of use of DME or AE;Impaired UE functional use;Increased edema      OT Treatment/Interventions: Therapeutic activities;Self-care/ADL training;Therapeutic exercise    OT Goals(Current goals can be found in the care plan section) Acute Rehab OT Goals Patient Stated Goal: none stated OT Goal Formulation: Patient unable to participate in goal setting  OT Frequency:     Barriers to D/C:            Co-evaluation              AM-PAC OT "6 Clicks" Daily Activity     Outcome Measure Help from another person eating meals?: Total Help from another person taking care of personal grooming?: A Lot Help from another person toileting, which includes using toliet, bedpan, or urinal?: Total Help from another person bathing (including washing, rinsing, drying)?: Total Help from another person to put on and taking off regular upper body clothing?: Total Help from another person to put on and taking off regular lower body clothing?: Total 6 Click Score: 7   End of Session Equipment Utilized During Treatment: Other (comment) (L palm protector)  Activity Tolerance: Patient limited by lethargy Patient left: in bed;with call bell/phone within reach  OT Visit Diagnosis: Other abnormalities of gait and mobility (R26.89);Other symptoms and signs involving cognitive function;Cognitive communication deficit (R41.841)                Time: 2952-8413 OT Time Calculation (min): 15 min Charges:  OT General Charges $OT Visit: 1 Visit OT  Evaluation $OT Eval Moderate Complexity: 1 Mod    Britt Bottom 05/18/2021, 2:12 PM

## 2021-05-18 NOTE — Progress Notes (Signed)
Initial Nutrition Assessment  DOCUMENTATION CODES:   Not applicable  INTERVENTION:   -Renal MVI daily -Nepro Shake po BID, each supplement provides 425 kcal and 19 grams protein  -30 ml Prosource Plus TID, each supplement provides 100 kcals and 15 grams protein -Feeding assistance with meals   NUTRITION DIAGNOSIS:   Increased nutrient needs related to chronic illness (ESRD on HD) as evidenced by estimated needs.  GOAL:   Patient will meet greater than or equal to 90% of their needs  MONITOR:   PO intake, Supplement acceptance, Diet advancement, Labs, Weight trends, Skin, I & O's  REASON FOR ASSESSMENT:   Malnutrition Screening Tool    ASSESSMENT:   Kirsten Mcmahon is a 75 y.o. female with medical history significant of ESRD on HD, HTN, HLD, CVA with residual left-sided hemiparesis, seizure disorder, hypothyroidism,  DM type II, streptococcal bacteremia on IV vancomycin with dialysis, and intermittent encephalopathy presents for altered mental status and episode of vomiting.  History is obtained from talks with the patient's son over the phone.  After patient had been able to be transported home yesterday.  Son states that the patient was not talking, would only moving her head back and forth, and was unwilling to eat.  She seemed like she was still heavily sedated, and he wondered if it was related to her having the TTE on 6/10.  He states that he tried to let her rest and this morning around 4 AM and tried to feed the patient, but she started vomiting up this dark green material with foul odor all over the place and seemed as though she were choking on the emesis.  He immediately called 911.  He is unsure of any blood was present but states there might have possibly been some blood.  She had last had hemodialysis on 6/11 prior to discharge.  Pt admitted with acute metabolic encephalopathy.   6/13- s/p BSE- advanced to a dysphagia 2 diet with thin liquids  Reviewed I/O's: +600  ml x 24 hours   Pt familiar to this RD, as pt was just discharged over the weekend and readmitted.   Spoke with pt at bedside, who was lethargic, but provided delayed responses to closed answered questions. She reports she was eating well at home and HD was was going "good".   Reviewed wt hx; pt has experienced a 28% wt loss over the past 6 months, which is significant for time frame. Suspect some wt loss may be related to fluid losses from HD. Per nephrology notes, EDW 55 kg. Pt is currently below dry weight.   Medications reviewed and include miralax, aranesp, keppra, and 0.9% sodium chloride infusion @ 75 ml/hr.   Lab Results  Component Value Date   HGBA1C 5.6 05/13/2021   PTA DM medications are none.   Labs reviewed: CBGS: 125-141 (inpatient orders for glycemic control are 0-6 units insulin aspart every 4 hours).    NUTRITION - FOCUSED PHYSICAL EXAM:  Flowsheet Row Most Recent Value  Orbital Region Mild depletion  Upper Arm Region No depletion  Thoracic and Lumbar Region No depletion  Buccal Region No depletion  Temple Region Mild depletion  Clavicle Bone Region No depletion  Clavicle and Acromion Bone Region No depletion  Scapular Bone Region No depletion  Dorsal Hand No depletion  Patellar Region No depletion  Anterior Thigh Region No depletion  Posterior Calf Region Mild depletion  Edema (RD Assessment) Mild  Hair Reviewed  Eyes Reviewed  Mouth Reviewed  Skin  Reviewed  Nails Reviewed       Diet Order:   Diet Order             DIET DYS 2 Room service appropriate? Yes; Fluid consistency: Thin  Diet effective now                   EDUCATION NEEDS:   Not appropriate for education at this time  Skin:  Skin Assessment: Skin Integrity Issues: Skin Integrity Issues:: Stage II Stage II: buttocks Other: -  Last BM:  05/12/21  Height:   Ht Readings from Last 1 Encounters:  05/18/21 5\' 2"  (1.575 m)    Weight:   Wt Readings from Last 1 Encounters:   05/18/21 50.7 kg    Ideal Body Weight:  50 kg  BMI:  Body mass index is 20.44 kg/m.  Estimated Nutritional Needs:   Kcal:  1650-1850  Protein:  85-100 grams  Fluid:  1000 ml + UOP    [name], RD, LDN, [place] Registered Dietitian II Certified Diabetes Care and Education Specialist Please refer to [facility] for RD and/or RD on-call/weekend/after hours pager

## 2021-05-19 DIAGNOSIS — G9341 Metabolic encephalopathy: Secondary | ICD-10-CM | POA: Diagnosis not present

## 2021-05-19 DIAGNOSIS — I10 Essential (primary) hypertension: Secondary | ICD-10-CM | POA: Diagnosis not present

## 2021-05-19 DIAGNOSIS — R7881 Bacteremia: Secondary | ICD-10-CM | POA: Diagnosis not present

## 2021-05-19 DIAGNOSIS — N186 End stage renal disease: Secondary | ICD-10-CM | POA: Diagnosis not present

## 2021-05-19 LAB — GLUCOSE, CAPILLARY
Glucose-Capillary: 68 mg/dL — ABNORMAL LOW (ref 70–99)
Glucose-Capillary: 74 mg/dL (ref 70–99)
Glucose-Capillary: 97 mg/dL (ref 70–99)
Glucose-Capillary: 110 mg/dL — ABNORMAL HIGH (ref 70–99)
Glucose-Capillary: 114 mg/dL — ABNORMAL HIGH (ref 70–99)
Glucose-Capillary: 94 mg/dL (ref 70–99)
Glucose-Capillary: 97 mg/dL (ref 70–99)

## 2021-05-19 LAB — RENAL FUNCTION PANEL
Albumin: 2.9 g/dL — ABNORMAL LOW (ref 3.5–5.0)
Anion gap: 15 (ref 5–15)
BUN: 26 mg/dL — ABNORMAL HIGH (ref 8–23)
CO2: 20 mmol/L — ABNORMAL LOW (ref 22–32)
Calcium: 7.8 mg/dL — ABNORMAL LOW (ref 8.9–10.3)
Chloride: 100 mmol/L (ref 98–111)
Creatinine, Ser: 3.61 mg/dL — ABNORMAL HIGH (ref 0.44–1.00)
GFR, Estimated: 13 mL/min — ABNORMAL LOW (ref >60)
Glucose, Bld: 98 mg/dL (ref 70–99)
Phosphorus: 2.8 mg/dL (ref 2.5–4.6)
Potassium: 4.7 mmol/L (ref 3.5–5.1)
Sodium: 135 mmol/L (ref 135–145)

## 2021-05-19 MED ORDER — DARBEPOETIN ALFA 100 MCG/0.5ML IJ SOSY
100.0000 ug | PREFILLED_SYRINGE | Freq: Once | INTRAMUSCULAR | Status: AC
  Administered 2021-05-19: 100 ug via SUBCUTANEOUS
  Filled 2021-05-19 (×2): qty 0.5

## 2021-05-19 MED ORDER — SODIUM CHLORIDE 0.9 % IV SOLN: INTRAVENOUS | Status: DC

## 2021-05-19 NOTE — Progress Notes (Addendum)
[facility] Progress Note   Subjective: Seen in room. Seems brighter, more interactive today. Oriented to self only but answering simple questions appropriately.     Objective Vitals:   05/18/21 1730 05/18/21 2000 05/19/21 0447 05/19/21 0746  BP: 124/73 133/83 123/70 116/68  Pulse: (!) 111 (!) 107 97 96  Resp: 16 20 (!) 23 (!) 23  Temp:  98 F (36.7 C) 98.3 F (36.8 C) 98.4 F (36.9 C)  TempSrc:  Axillary Axillary Oral  SpO2: 100% 100% 100% 91%  Weight: 50.5 kg     Height:       Physical Exam General: chronically ill appearing female in NAD Heart: S1,S2 RRR No M/R/G SR 90s Lungs: CTAB slightly decreased in R base Abdomen: S, NT, ND Extremities: edema L hand, no LE edema Dialysis Access: RIJ [facility] intact     Additional Objective Labs: Basic Metabolic Panel: Recent Labs  Lab 05/17/21 0808 05/17/21 0919 05/17/21 1146 05/18/21 0112 05/18/21 1232  NA 139  --  141 141 143  K 4.9  --  4.5 4.1 4.3  CL 100  --   --  102 104  CO2 24  --   --  25 25  GLUCOSE 191*  --   --  139* 129*  BUN 29*  --   --  37* 39*  CREATININE 4.70*  --   --  5.24* 5.60*  CALCIUM 9.3  --   --  9.0 8.8*  PHOS  --  3.9  --  4.7* 4.9*   Liver Function Tests: Recent Labs  Lab 05/17/21 0808 05/18/21 0112 05/18/21 1232  AST 28  --   --   ALT 16  --   --   ALKPHOS 87  --   --   BILITOT 1.8*  --   --   PROT 7.9  --   --   ALBUMIN 3.5 3.0* 2.9*   Recent Labs  Lab 05/17/21 0808  LIPASE 26   CBC: Recent Labs  Lab 05/13/21 1416 05/15/21 1117 05/16/21 0714 05/17/21 0808 05/17/21 1146 05/18/21 0112 05/18/21 1232  WBC 7.3  --  8.1 23.6*  --  16.4* 16.4*  NEUTROABS  --   --   --  22.0*  --   --   --   HGB 8.2*   < > 7.1* 10.2* 9.9* 9.0* 9.0*  HCT 26.5*   < > 23.8* 32.3* 29.0* 28.2* 28.6*  MCV 92.3  --  95.2 92.6  --  92.2 94.1  PLT 251  --  291 202  --  220 255   < > = values in this interval not displayed.   Blood Culture    Component Value Date/Time   SDES  BLOOD LEFT FOREARM 05/17/2021 0953   SPECREQUEST  05/17/2021 0953    BOTTLES DRAWN AEROBIC AND ANAEROBIC Blood Culture adequate volume   CULT  05/17/2021 0953    NO GROWTH 2 DAYS Performed at [facility], [place] [address] [place], [place] [address]    REPTSTATUS PENDING 05/17/2021 0174    Cardiac Enzymes: No results for input(s): CKTOTAL, CKMB, CKMBINDEX, TROPONINI in the last 168 hours. CBG: Recent Labs  Lab 05/18/21 2003 05/18/21 2357 05/19/21 0448 05/19/21 0530 05/19/21 0744  GLUCAP 71 81 68* 74 97   Iron Studies: No results for input(s): IRON, TIBC, TRANSFERRIN, FERRITIN in the last 72 hours. @lablastinr3 @ Studies/Results: CT ABDOMEN PELVIS WO CONTRAST  Result Date: 05/17/2021 CLINICAL DATA:  Altered mental status.  Intra-abdominal infection. On antibiotics for bacteremia. End-stage renal disease. Vomiting. EXAM: CT ABDOMEN AND PELVIS WITHOUT CONTRAST TECHNIQUE: Multidetector CT imaging of the abdomen and pelvis was performed following the standard protocol without IV contrast. COMPARISON:  None. FINDINGS: Lower chest: Right greater than left base patchy airspace disease. Cardiomegaly. Large bore central line terminating at the low right atrium. Hepatobiliary: Multifactorial degradation, including patient arm position and lack of oral or IV contrast. Grossly normal noncontrast appearance of the liver. Multiple gallstones without acute cholecystitis or biliary duct dilatation. Pancreas: Normal, without mass or ductal dilatation. Spleen: Normal in size, without focal abnormality. Adrenals/Urinary Tract: Left greater than right adrenal thickening, without dominant mass. Moderate bilateral renal cortical thinning. Left renal vascular calcifications. An interpolar left renal 1.0 cm low-density lesion is suspected, likely a cyst. No hydronephrosis. No hydroureter or ureteric calculi. No bladder calculi. Stomach/Bowel: Normal stomach, without wall thickening. Stool within the rectum of  6.2 cm. No obstruction. Scattered colonic diverticula. Normal terminal ileum and appendix. Normal small bowel. Vascular/Lymphatic: Aortic atherosclerosis. No abdominopelvic adenopathy. Reproductive: Hysterectomy.  No adnexal mass. Other: No significant free fluid. No free intraperitoneal air. Mild pelvic floor laxity. Multiple soft tissue density nodules within the anterior pelvic wall are likely injection sites. Musculoskeletal: Lumbosacral spondylosis. IMPRESSION: 1. Multifactorial degradation, including patient arm position and lack of oral or IV contrast. 2. Given this factor, no acute process in the abdomen or pelvis. 3. Cholelithiasis. 4. Right greater than left base patchy airspace disease, suspicious for pneumonia. 5. Possible fecal impaction. 6. Aortic Atherosclerosis (ICD10-I70.0). Electronically Signed   By: Abigail Miyamoto M.D.   On: 05/17/2021 14:47   Medications:  sodium chloride     levETIRAcetam 500 mg (05/19/21 1001)   piperacillin-tazobactam (ZOSYN)  IV 2.25 g (05/19/21 0533)    (feeding supplement) PROSource Plus  30 mL Oral TID BM   aspirin EC  81 mg Oral Daily   atorvastatin  40 mg Oral Daily   Chlorhexidine Gluconate Cloth  6 each Topical Q0600   clopidogrel  75 mg Oral Daily   darbepoetin (ARANESP) injection - DIALYSIS  100 mcg Intravenous Q Mon-HD   darbepoetin (ARANESP) injection - NON-DIALYSIS  100 mcg Subcutaneous Once   feeding supplement (NEPRO CARB STEADY)  237 mL Oral BID BM   heparin  5,000 Units Subcutaneous Q8H   insulin aspart  0-6 Units Subcutaneous Q4H   latanoprost  1 drop Both Eyes QHS   letrozole  2.5 mg Oral Daily   levothyroxine  112 mcg Oral Q0600   metoprolol succinate  50 mg Oral Daily   [START ON 05/20/2021] midodrine  5 mg Oral Q M,W,F-HD   multivitamin  1 tablet Oral QHS   polyethylene glycol  17 g Oral BID   sodium chloride flush  3 mL Intravenous Q12H     Dialysis Orders: MWF  - GOC  4hrs, BFR 400, DFR 500,  EDW 55kg, 2K/ 2.5Ca   Access:  TDC  Heparin none Mircera 40mcg IV q2wks - not given, to start 6/13   Assessment/Plan: 1. AMS - previous admission for same. Etiology unclear, likely multifactorial.  Recent acute CVA which neurology did not think was origin.  CT with possible PNA. WBC 23.6. Recent bacteremia with no evidence of vegetations on TEE.  Continued GOC discussion with family would be beneficial. 2. Possible PNA - CT with air space disease, ?aspiration, family reported vomiting overnight. ABX started. Per primary 3. Leukocytosis - 23.6. CT with possible PNA.  N/v overnight.  On  vanc/zosyn 4. Nausea/vomiting - per family vomiting over night. Work up per primary. 5. ESRD - on HD MWF.  Orders written for HD 05/18/21 per regular schedule. Requires sitter at outpatient dialysis due to behavior/safety issues. 6. Anemia of CKD- Hgb 19.0. ESA not given in HD yesterday. Giving today on nursing unit.  1 unit pRBC given last admission with HD 6/11.  7. Secondary hyperparathyroidism - Ca and phos in goal.  Not on VDRA or binders. 8. HTN/volume - Has been on NS @ 75cc/hr since re-admission. Ran even on HD yesterday. HR down to 90s. Will decrease NS to 50cc and infuse for 12 more hours. Run even tomorrow.  9. Nutrition - Renal diet with fluid restrictions once advanced. 10. Sterptococcal bacteremia - noted on previous admission.  On Vanc with HD until 6/27.  TEE on 6/10 with no vegetations, +PFO. 11. Seizure disorder - on keppra 12. DMT2 - per primary 13. Nutrition: minimal PO intake. Has seen speech therapy and diet has been changed to D3 diet. If PO intake improves, stop IVF in 12 hours.     Eloina Ergle H. Marycarmen Hagey NP-C 05/19/2021, 10:26 AM  [employer] [phone]

## 2021-05-19 NOTE — Progress Notes (Signed)
Night float progress note  Informed by RN that patient is weak on her left side, not moving her left upper and lower extremity.  Chart reviewed, she does have a history of residual left hemiparesis from prior CVA.  I also spoke to Dr. Rory Percy -since MRI and full stroke work-up was done during her recent admission, he is not recommending any further imaging or stroke work-up at this time.

## 2021-05-19 NOTE — Plan of Care (Signed)
I was called by the on-call hospitalist regarding this patient-question worsening left hemiparesis. Patient is an extensive area of right hemispheric encephalomalacia and documented left-sided hemiplegia at baseline. When she was admitted for strokes last week, her encephalopathy was disproportionate to the strokes that was seen in multiple vascular territories and an infectious work-up including that for endocarditis was recommended. I do not have any further recommendations at this time.  She needs a full medical work-up for her leukocytosis and sepsis-like picture per primary team. Not sure any further imaging will change much in terms of her treatment-we do know that she has a large area of encephalomalacia in the right cerebral hemisphere and multiple recent strokes.  Extremely poor functional baseline. Agree with nephrology-goals of care conversations should be undertaken at this time as well. Discussed with on-call hospitalist Dr. Marlowe Sax -- Amie Portland, MD Neurologist [facility] Pager: [phone]

## 2021-05-19 NOTE — Progress Notes (Signed)
  Speech Language Pathology Treatment: Dysphagia  Patient Details Name: Kirsten Mcmahon MRN: [id number] DOB: 1946-05-05 Today's Date: 05/19/2021 Time: 6599-3570 SLP Time Calculation (min) (ACUTE ONLY): 26 min  Assessment / Plan / Recommendation Clinical Impression  Much improvement in mentation noted this am, with pt engaging in simple conversation with this clinician and requesting POs. Trials of dysphagia 2 (ground) and regular textured solids provided with pt exhibiting x2 immediate coughing after incidence of belching during dry cracker consumption. Question impact of possible reflux, however no other incidences of this nature occurred as the session progressed. Mastication time of solids much improved from initial evaluation and oral clearance adequate. Reinforced utilization of small bites/sips at slow rate to decrease risk for aspiration. Recommend upgrade to dysphagia 3/thin liquid diet with continued full supervision for recommended strategies. Above discussed with RN. Will continue to follow acutely.    HPI HPI: Pt is a 75 y.o. female presented to ED for AMS and episode of vomiting.  History is obtained from pt's son over the phone. He reports that shortly prior to admission he tried to feed the pt, but she started vomiting dark green material with foul odor and seemed as though she were choking on the emesis. CT head (05/17/21) with no acute findings, CXR (05/17/21) with no acute cardiopulmonary disease, CT abd (05/17/21) with patchy airspace disease R>L lung suspicious for PNA and possible fecal impaction. She was recently admitted for encephalopathy of unclear origin and acute stroke, discharged 05/16/21. PMH: ESRD on HD, HTN, HLD, CVA with residual left-sided hemiparesis, seizure disorder, hypothyroidism,  DM type II, streptococcal bacteremia on IV vancomycin with dialysis, and intermittent encephalopathy. Last BSE (04/21/21) revealed adequate oropharygeal swallow with Dysphagia 2/thin liquid  diet recommended. Failed [facility] due to lethargy per RN (05/18/21).      SLP Plan          Recommendations  Diet recommendations: Dysphagia 3 (mechanical soft);Thin liquid Liquids provided via: Cup;Straw Medication Administration: Crushed with puree Supervision: Staff to assist with self feeding;Full supervision/cueing for compensatory strategies Compensations: Minimize environmental distractions;Slow rate;Small sips/bites;Follow solids with liquid Postural Changes and/or Swallow Maneuvers: Seated upright 90 degrees                Oral Care Recommendations: Oral care BID;Staff/trained caregiver to provide oral care Follow up Recommendations: Skilled Nursing facility;24 hour supervision/assistance SLP Visit Diagnosis: Dysphagia, unspecified (R13.10)       [place], [place], [place] Office Number: [phone]   Acie Fredrickson 05/19/2021, 9:30 AM

## 2021-05-19 NOTE — Progress Notes (Addendum)
[facility] PROGRESS NOTE    Progress Note  Kirsten Mcmahon  WJX:[id number] DOB: 05-Feb-1946 DOA: 05/17/2021 PCP: [place]     Brief Narrative:   Kirsten Mcmahon is an 75 y.o. 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  Patient in the ED he was afebrile with respiration of 25 satting greater 92% on room air White count 23,000 hemoglobin 10, chest x-ray showed no acute findings, CT of the head stable compared to previous.  MRI of the brain was significantly related to motion, showed a few untreated acute infarctions in the right temporal and parietal lobe and bilateral thalamus.  Assessment/Plan:   Acute metabolic encephalopathy: Now improved. PT, OT, Speech consult skilled nursing facility. 2-D echo 04/21/2019 showed an EF of 55% with grade 1 diastolic heart failure. Start patient on ASA 81mg  daily. BP goal: permissive HTN upto 220/120 mmHg Telemetry monitoring.  Acute nausea and vomiting: Chest x-ray showed no acute findings. CT scan of the abdomen no acute process, but it did showed right greater than left patchy airspace disease. Fecal impactation. Started miralax.  SIRS: White blood cell count is trending down, T-max 98.3 she is currently on on zosyn. Cultures from 05/17/2020 remain negative till date.   Continues  IV zosyn and continue IV vancomycin for her streptococcal bacteremia  ESRD: Last HD 6.11. Renal notified.  Anemia of chronic disease: Hbg stable compared to previous.  Essential hypertension: Cont metoprolol,  relative well controlled.  Seizure disorder: Cont keppra.  Non-insulin diabetes mellitus: Cont SSI.  Hypothyroidism: Cont synthroid.  Elevated ammonia level: Unlikely to be causing patient symptoms.  Stage II pressure ulcer present on admission: RN Pressure Injury Documentation: Pressure Injury 04/29/21 Buttocks Mid Stage 2 -  Partial thickness loss of dermis presenting as a shallow open injury with a red, pink wound bed without slough. (Active)  04/29/21 0045  Location: Buttocks  Location Orientation: Mid  Staging: Stage 2 -  Partial thickness loss of dermis presenting as a shallow open injury with a red, pink wound bed without slough.  Wound Description (Comments):   Present on Admission: Yes     DVT prophylaxis: lovenox Family Communication:Son Status is: Inpatient  Remains inpatient appropriate because:Hemodynamically unstable  Dispo: The patient is from: Home              Anticipated d/c is to: SNF              Patient currently is not medically stable to d/c.   Difficult to place patient No        Code Status:     Code Status Orders  (From admission, onward)           Start     Ordered   05/17/21 1231  Full code  Continuous        05/17/21 1231           Code Status History     Date Active Date Inactive Code Status Order ID Comments User Context   04/28/2021 1839 05/17/2021 0018 Full Code [id number]  Little Ishikawa, MD ED  04/09/2021 2102 04/24/2021 2217 Full Code [id number]  Elwyn Reach, MD Inpatient   01/17/2021 1921 01/21/2021 2109 Full Code [id number]  Ivor Costa, MD ED   07/15/2017 1640 07/16/2017 2032 Full Code [id number]  Armandina Gemma, MD Inpatient         IV Access:   Peripheral IV   Procedures and diagnostic studies:   CT ABDOMEN PELVIS WO CONTRAST  Result Date: 05/17/2021 CLINICAL DATA:  Altered mental status. Intra-abdominal infection. On antibiotics for bacteremia. End-stage renal disease. Vomiting. EXAM: CT ABDOMEN  AND PELVIS WITHOUT CONTRAST TECHNIQUE: Multidetector CT imaging of the abdomen and pelvis was performed following the standard protocol without IV contrast. COMPARISON:  None. FINDINGS: Lower chest: Right greater than left base patchy airspace disease. Cardiomegaly. Large bore central line terminating at the low right atrium. Hepatobiliary: Multifactorial degradation, including patient arm position and lack of oral or IV contrast. Grossly normal noncontrast appearance of the liver. Multiple gallstones without acute cholecystitis or biliary duct dilatation. Pancreas: Normal, without mass or ductal dilatation. Spleen: Normal in size, without focal abnormality. Adrenals/Urinary Tract: Left greater than right adrenal thickening, without dominant mass. Moderate bilateral renal cortical thinning. Left renal vascular calcifications. An interpolar left renal 1.0 cm low-density lesion is suspected, likely a cyst. No hydronephrosis. No hydroureter or ureteric calculi. No bladder calculi. Stomach/Bowel: Normal stomach, without wall thickening. Stool within the rectum of 6.2 cm. No obstruction. Scattered colonic diverticula. Normal terminal ileum and appendix. Normal small bowel. Vascular/Lymphatic: Aortic atherosclerosis. No abdominopelvic adenopathy. Reproductive: Hysterectomy.  No adnexal mass. Other: No significant free fluid. No free intraperitoneal air. Mild pelvic floor laxity. Multiple soft tissue density nodules within the anterior pelvic wall are likely injection sites. Musculoskeletal: Lumbosacral spondylosis. IMPRESSION: 1. Multifactorial degradation, including patient arm position and lack of oral or IV contrast. 2. Given this factor, no acute process in the abdomen or pelvis. 3. Cholelithiasis. 4. Right greater than left base patchy airspace disease, suspicious for pneumonia. 5. Possible fecal impaction. 6. Aortic Atherosclerosis (ICD10-I70.0). Electronically Signed   By: Abigail Miyamoto M.D.   On: 05/17/2021 14:47      Medical Consultants:   None.   Subjective:    Kirsten Mcmahon hungry no complains  Objective:    Vitals:   05/18/21 1730 05/18/21 2000 05/19/21 0447 05/19/21 0746  BP: 124/73 133/83 123/70 116/68  Pulse: (!) 111 (!) 107 97 96  Resp: 16 20 (!) 23 (!) 23  Temp:  98 F (36.7 C) 98.3 F (36.8 C) 98.4 F (36.9 C)  TempSrc:  Axillary Axillary Oral  SpO2: 100% 100% 100% 91%  Weight: 50.5 kg     Height:       SpO2: 91 %   Intake/Output Summary (Last 24 hours) at 05/19/2021 1042 Last data filed at 05/18/2021 1730 Gross per 24 hour  Intake 1036.59 ml  Output 630 ml  Net 406.59 ml    Filed Weights   05/18/21 1121 05/18/21 1355 05/18/21 1730  Weight: 50.7 kg 50.7 kg 50.5 kg    Exam: General exam: In no acute distress. Respiratory system: Good air movement and clear to auscultation. Cardiovascular system: S1 & S2 heard, RRR. No JVD. Gastrointestinal system: Abdomen is nondistended, soft and nontender.  Extremities: No pedal edema. Skin: No rashes, lesions or ulcers  Data Reviewed:    Labs: Basic Metabolic Panel: Recent Labs  Lab 05/13/21 1416 05/15/21 1117 05/16/21 0714 05/17/21 0808 05/17/21 0919 05/17/21 1146 05/18/21 0112 05/18/21 1232  NA 136 137 137 139  --  141 141 143  K 3.4* 4.1 4.0 4.9  --  4.5 4.1 4.3  CL 97* 101 101 100  --   --  102 104  CO2 28  --  29 24  --   --  25 25  GLUCOSE 197* 100* 104* 191*  --   --  139* 129*  BUN 30* 27* 11 29*  --   --  37* 39*  CREATININE 5.59* 5.40* 2.47* 4.70*  --   --  5.24* 5.60*  CALCIUM 8.6*  --  7.8* 9.3  --   --  9.0 8.8*  PHOS 3.6  --  1.7*  --  3.9  --  4.7* 4.9*    GFR Estimated Creatinine Clearance: 6.8 mL/min (A) (by C-G formula based on SCr of 5.6 mg/dL (H)). Liver Function Tests: Recent Labs  Lab 05/13/21 1416 05/16/21 0714 05/17/21 0808 05/18/21 0112 05/18/21 1232  AST  --   --  28  --   --   ALT  --   --  16  --   --   ALKPHOS  --   --  87  --   --   BILITOT  --   --  1.8*  --    --   PROT  --   --  7.9  --   --   ALBUMIN 2.6* 2.6* 3.5 3.0* 2.9*    Recent Labs  Lab 05/17/21 0808  LIPASE 26    Recent Labs  Lab 05/17/21 0808  AMMONIA 37*    Coagulation profile No results for input(s): INR, PROTIME in the last 168 hours. COVID-19 Labs  No results for input(s): DDIMER, FERRITIN, LDH, CRP in the last 72 hours.  Lab Results  Component Value Date   SARSCOV2NAA NEGATIVE 05/17/2021   SARSCOV2NAA NEGATIVE 04/28/2021   [place] NEGATIVE 04/09/2021   [place] NEGATIVE 01/17/2021    CBC: Recent Labs  Lab 05/13/21 1416 05/15/21 1117 05/16/21 0714 05/17/21 0808 05/17/21 1146 05/18/21 0112 05/18/21 1232  WBC 7.3  --  8.1 23.6*  --  16.4* 16.4*  NEUTROABS  --   --   --  22.0*  --   --   --   HGB 8.2*   < > 7.1* 10.2* 9.9* 9.0* 9.0*  HCT 26.5*   < > 23.8* 32.3* 29.0* 28.2* 28.6*  MCV 92.3  --  95.2 92.6  --  92.2 94.1  PLT 251  --  291 202  --  220 255   < > = values in this interval not displayed.    Cardiac Enzymes: No results for input(s): CKTOTAL, CKMB, CKMBINDEX, TROPONINI in the last 168 hours. BNP (last 3 results) No results for input(s): PROBNP in the last 8760 hours. CBG: Recent Labs  Lab 05/18/21 2003 05/18/21 2357 05/19/21 0448 05/19/21 0530 05/19/21 0744  GLUCAP 71 81 68* 74 97    D-Dimer: No results for input(s): DDIMER in the last 72 hours. Hgb A1c: No results for input(s): HGBA1C in the last 72 hours. Lipid Profile: No results for input(s): CHOL, HDL, LDLCALC, TRIG, CHOLHDL, LDLDIRECT in the last 72 hours. Thyroid function studies: No results for input(s): TSH, T4TOTAL, T3FREE, THYROIDAB in the last 72 hours.  Invalid input(s): FREET3 Anemia work up: No results for input(s): VITAMINB12, FOLATE, FERRITIN, TIBC, IRON, RETICCTPCT in the last 72 hours. Sepsis Labs: Recent Labs  Lab 05/16/21 0714 05/17/21 0808 05/17/21 0936 05/17/21 1138 05/18/21 0112 05/18/21 1232  WBC 8.1 23.6*  --   --  16.4* 16.4*   LATICACIDVEN  --   --  1.8 1.4  --   --     Microbiology Recent Results (from the past 240 hour(s))  Culture, blood (routine x 2)     Status: None (Preliminary result)   Collection Time: 05/17/21  9:48 AM   Specimen: BLOOD LEFT FOREARM  Result Value Ref Range Status   Specimen Description BLOOD LEFT FOREARM  Final   Special Requests   Final    BOTTLES DRAWN AEROBIC AND ANAEROBIC Blood Culture adequate volume   Culture   Final    NO GROWTH 2 DAYS Performed at [facility], [address], [place], [place] [address]    Report Status PENDING  Incomplete  Culture, blood (routine x 2)     Status: None (Preliminary result)   Collection Time: 05/17/21  9:53 AM   Specimen: BLOOD LEFT FOREARM  Result Value Ref Range Status   Specimen Description BLOOD LEFT FOREARM  Final   Special Requests   Final    BOTTLES DRAWN AEROBIC AND ANAEROBIC Blood Culture adequate volume   Culture   Final    NO GROWTH 2 DAYS Performed at [facility], [place] [address], [place], [place] [address]    Report Status PENDING  Incomplete  Resp Panel by RT-PCR (Flu A&B, Covid) Nasopharyngeal Swab     Status: None   Collection Time: 05/17/21 11:28 AM   Specimen: Nasopharyngeal Swab; Nasopharyngeal(NP) swabs in vial transport medium  Result Value Ref Range Status   SARS Coronavirus 2 by RT PCR NEGATIVE NEGATIVE Final    Comment: (NOTE) SARS-CoV-2 target nucleic acids are NOT DETECTED.  The SARS-CoV-2 RNA is generally detectable in upper respiratory specimens during the acute phase of infection. The lowest concentration of SARS-CoV-2 viral copies this assay can detect is 138 copies/mL. A negative result does not preclude SARS-Cov-2 infection and should not be used as the sole basis for treatment or other patient management decisions. A negative result may occur with  improper specimen collection/handling, submission of specimen other than nasopharyngeal swab, presence of viral mutation(s) within  the areas targeted by this assay, and inadequate number of viral copies(<138 copies/mL). A negative result must be combined with clinical observations, patient history, and epidemiological information. The expected result is Negative.  Fact Sheet for Patients:  EntrepreneurPulse.com.au  Fact Sheet for Healthcare Providers:  IncredibleEmployment.be  This test is no t yet approved or cleared by the [place] FDA and  has been authorized for detection and/or diagnosis of SARS-CoV-2 by FDA under an Emergency Use Authorization (EUA). This EUA will remain  in effect (meaning this test can be used) for the duration of the COVID-19 declaration under Section 564(b)(1) of the Act, 21 U.S.C.section 360bbb-3(b)(1), unless the authorization is terminated  or revoked sooner.       Influenza A by PCR NEGATIVE NEGATIVE Final   Influenza B by PCR NEGATIVE NEGATIVE Final    Comment: (NOTE) The Xpert Xpress SARS-CoV-2/FLU/RSV plus assay is intended as an aid in the diagnosis of influenza from Nasopharyngeal swab specimens and should not be used as a sole basis for treatment. Nasal washings and aspirates are unacceptable for Xpert Xpress SARS-CoV-2/FLU/RSV testing.  Fact Sheet for Patients: EntrepreneurPulse.com.au  Fact Sheet for Healthcare Providers: IncredibleEmployment.be  This test is not yet approved or cleared by the [place] FDA and has been authorized for detection and/or diagnosis of SARS-CoV-2 by FDA under an Emergency Use Authorization (EUA). This EUA will remain  in effect (meaning this test can be used) for the duration of the COVID-19 declaration under Section 564(b)(1) of the Act, 21 U.S.C. section 360bbb-3(b)(1), unless the authorization is terminated or revoked.  Performed at [facility], [place] [address] [place], [place] [address]      Medications:    (feeding supplement) PROSource  Plus  30 mL Oral TID BM   aspirin EC  81 mg Oral Daily   atorvastatin  40 mg Oral Daily   Chlorhexidine Gluconate Cloth  6 each Topical Q0600   clopidogrel  75 mg Oral Daily   darbepoetin (ARANESP) injection - DIALYSIS  100 mcg Intravenous Q Mon-HD   darbepoetin (ARANESP) injection - NON-DIALYSIS  100 mcg Subcutaneous Once   feeding supplement (NEPRO CARB STEADY)  237 mL Oral BID BM   heparin  5,000 Units Subcutaneous Q8H   insulin aspart  0-6 Units Subcutaneous Q4H   latanoprost  1 drop Both Eyes QHS   letrozole  2.5 mg Oral Daily   levothyroxine  112 mcg Oral Q0600   metoprolol succinate  50 mg Oral Daily   [START ON 05/20/2021] midodrine  5 mg Oral Q M,W,F-HD   multivitamin  1 tablet Oral QHS   polyethylene glycol  17 g Oral BID   sodium chloride flush  3 mL Intravenous Q12H   Continuous Infusions:  sodium chloride     levETIRAcetam 500 mg (05/19/21 1001)   piperacillin-tazobactam (ZOSYN)  IV 2.25 g (05/19/21 0533)      LOS: 2 days   Charlynne Cousins  [facility]  05/19/2021, 10:42 AM

## 2021-05-20 DIAGNOSIS — N186 End stage renal disease: Secondary | ICD-10-CM | POA: Diagnosis not present

## 2021-05-20 DIAGNOSIS — G9341 Metabolic encephalopathy: Secondary | ICD-10-CM | POA: Diagnosis not present

## 2021-05-20 LAB — RENAL FUNCTION PANEL
Albumin: 2.4 g/dL — ABNORMAL LOW (ref 3.5–5.0)
Anion gap: 12 (ref 5–15)
BUN: 38 mg/dL — ABNORMAL HIGH (ref 8–23)
CO2: 23 mmol/L (ref 22–32)
Calcium: 7.7 mg/dL — ABNORMAL LOW (ref 8.9–10.3)
Chloride: 102 mmol/L (ref 98–111)
Creatinine, Ser: 4.6 mg/dL — ABNORMAL HIGH (ref 0.44–1.00)
GFR, Estimated: 9 mL/min — ABNORMAL LOW (ref >60)
Glucose, Bld: 104 mg/dL — ABNORMAL HIGH (ref 70–99)
Phosphorus: 3.3 mg/dL (ref 2.5–4.6)
Potassium: 3.4 mmol/L — ABNORMAL LOW (ref 3.5–5.1)
Sodium: 137 mmol/L (ref 135–145)

## 2021-05-20 LAB — GLUCOSE, CAPILLARY
Glucose-Capillary: 179 mg/dL — ABNORMAL HIGH (ref 70–99)
Glucose-Capillary: 230 mg/dL — ABNORMAL HIGH (ref 70–99)
Glucose-Capillary: 55 mg/dL — ABNORMAL LOW (ref 70–99)
Glucose-Capillary: 65 mg/dL — ABNORMAL LOW (ref 70–99)
Glucose-Capillary: 81 mg/dL (ref 70–99)
Glucose-Capillary: 88 mg/dL (ref 70–99)
Glucose-Capillary: 98 mg/dL (ref 70–99)

## 2021-05-20 LAB — MAGNESIUM: Magnesium: 1.8 mg/dL (ref 1.7–2.4)

## 2021-05-20 LAB — POTASSIUM: Potassium: 3.4 mmol/L — ABNORMAL LOW (ref 3.5–5.1)

## 2021-05-20 MED ORDER — LEVETIRACETAM IN NACL 500 MG/100ML IV SOLN
500.0000 mg | INTRAVENOUS | Status: DC
  Administered 2021-05-20: 500 mg via INTRAVENOUS
  Filled 2021-05-20 (×2): qty 100

## 2021-05-20 MED ORDER — ALTEPLASE 2 MG IJ SOLR
INTRAMUSCULAR | Status: AC
  Filled 2021-05-20: qty 2

## 2021-05-20 MED ORDER — ALTEPLASE 2 MG IJ SOLR
2.0000 mg | Freq: Once | INTRAMUSCULAR | Status: AC
  Administered 2021-05-20: 2 mg

## 2021-05-20 MED ORDER — MIDODRINE HCL 5 MG PO TABS
ORAL_TABLET | ORAL | Status: AC
  Filled 2021-05-20: qty 1

## 2021-05-20 MED ORDER — HEPARIN SODIUM (PORCINE) 1000 UNIT/ML IJ SOLN
INTRAMUSCULAR | Status: AC
  Administered 2021-05-20: 1000 [IU]
  Filled 2021-05-20: qty 4

## 2021-05-20 NOTE — TOC Initial Note (Addendum)
Transition of Care [facility]) - Initial/Assessment Note    Patient Details  Name: Kirsten Mcmahon MRN: [id number] Date of Birth: [birth year]  Transition of [facility]) CM/SW Contact:    Benard Halsted, LCSW Phone Number: 05/20/2021, 2:16 PM  Clinical Narrative:                 2pm-CSW received call from NP Donell Sievert [phone]) with [facility] ([facility]). She is requesting a palliative consult; CSW requested from MD. Their SW is assessing with patient's son the possibility of SNF placement due to patient's hospital readmission. Barriers would include determining if patient would remain in [place], since [facility] is only contracted with [employer], and [facility] in [place] and they would not be able to transport patient to dialysis in [place]. Also, patient would still require a sitter at HD. CSW will continue to follow.   3pm-CSW spoke with Kirsten Mcmahon, patient's son. He is requesting that patient return home again at discharge. He stated he has care lined up with [facility] and either he, his sister, his girlfriend, or his nephew will be at home with the patient 24/7. He understands SNF recommendation but he is requesting that the hospital let him "try" to keep her at home. He has all needed DME.   3:45pm-CSW received call from Dr. Ovid Curd with [facility] ([facility]). She requested medical update and potential discharge date on patient so they can have their team ready. CSW notified her that per MD, patient will likely be stable to discharge home tomorrow.   Expected Discharge Plan: Skilled Nursing Facility Barriers to Discharge: Continued Medical Work up, Requiring sitter/restraints   Patient Goals and CMS Choice   CMS Medicare.gov Compare Post Acute Care list provided to:: Patient Represenative (must comment) Choice offered to / list presented to : Adult Children  Expected Discharge Plan and Services Expected Discharge Plan: [place] In-house Referral:  Clinical Social Work     Living arrangements for the past 2 months: [place]                                      Prior Living Arrangements/Services Living arrangements for the past 2 months: Single Family Home Lives with:: Adult Children Patient language and need for interpreter reviewed:: Yes Do you feel safe going back to the place where you live?: Yes      Need for Family Participation in Patient Care: Yes (Comment) Care giver support system in place?: Yes (comment) Current home services: DME Criminal Activity/Legal Involvement Pertinent to Current Situation/Hospitalization: No - Comment as needed  Activities of Daily Living Home Assistive Devices/Equipment: Hoyer Lift ADL Screening (condition at time of admission) Patient's cognitive ability adequate to safely complete daily activities?: No Is the patient deaf or have difficulty hearing?: No Does the patient have difficulty seeing, even when wearing glasses/contacts?: No Does the patient have difficulty concentrating, remembering, or making decisions?: No Patient able to express need for assistance with ADLs?: No Does the patient have difficulty dressing or bathing?: Yes Independently performs ADLs?: No Communication: Independent Dressing (OT): Dependent Is this a change from baseline?: Pre-admission baseline Grooming: Dependent Is this a change from baseline?: Pre-admission baseline Feeding: Dependent Is this a change from baseline?: Pre-admission baseline Bathing: Dependent Is this a change from baseline?: Pre-admission baseline Toileting: Dependent Is this a change from baseline?: Pre-admission baseline In/Out Bed: Dependent Is this a change from  baseline?: Pre-admission baseline Walks in Home: Dependent Is this a change from baseline?: Pre-admission baseline Does the patient have difficulty walking or climbing stairs?: Yes Weakness of Legs: Both Weakness of Arms/Hands: Both  Permission  Sought/Granted Permission sought to share information with : Facility Sport and exercise psychologist, Family Supports Permission granted to share information with : No  Share Information with NAME: Kirsten Mcmahon  Permission granted to share info w AGENCY: [employer] granted to share info w Relationship: Son  Permission granted to share info w Contact Information: [phone]  Emotional Assessment Appearance:: Appears stated age Attitude/Demeanor/Rapport: Unable to Assess Affect (typically observed): Unable to Assess Orientation: : Fluctuating Orientation (Suspected and/or reported Sundowners) Alcohol / Substance Use: Not Applicable Psych Involvement: No (comment)  Admission diagnosis:  Altered mental status, unspecified altered mental status type [I43.32] Acute metabolic encephalopathy [R51.88] Patient Active Problem List   Diagnosis Date Noted   Recent cerebrovascular accident (CVA) 05/17/2021   History of CVA with residual deficit 05/17/2021   Cerebral embolism with cerebral infarction 05/13/2021   Physical deconditioning 04/30/2021   Generalized muscle weakness 04/30/2021   Pressure injury of skin 04/29/2021   AKI (acute kidney injury) ([place]) 04/28/2021   Streptococcal sepsis, unspecified ([place]) 04/18/2021   Streptococcal bacteremia 04/18/2021   Leukocytosis 04/17/2021   Fever 04/17/2021   ESRD (end stage renal disease) ([place])    Acute kidney injury superimposed on CKD ([place]) 04/09/2021   ARF (acute renal failure) ([place]) 01/18/2021   Fall 01/17/2021   Hypertension    Hypothyroidism    Stroke ([place])    GERD (gastroesophageal reflux disease)    Anemia in chronic kidney disease (CKD)    Acute metabolic encephalopathy    Acute renal failure superimposed on stage 3b chronic kidney disease ([place])    Type II diabetes mellitus with renal manifestations (HCC)    Abnormal mammogram of left breast 07/30/2018   Closed displaced oblique fracture of shaft of left humerus 03/14/2018    Osteopenia 01/20/2018   Multiple thyroid nodules 07/13/2017   Tracheal deviation 07/13/2017   Malignant neoplasm of upper-outer quadrant of right breast in female, estrogen receptor positive ([place]) 01/20/2017   Primary vulvar squamous cell carcinoma ([place]) 01/20/2017   PCP:  [place]:   [place], [place] [employer] [place] [address] Phone: [phone] Fax: [phone]     Social Determinants of Health (SDOH) Interventions    Readmission Risk Interventions Readmission Risk Prevention Plan 05/20/2021 05/16/2021 04/11/2021  Transportation Screening Complete Complete Complete  PCP or Specialist Appt within 3-5 Days - - Complete  HRI or [place] - - Complete  Social Work Consult for [place] Planning/Counseling - - Complete  Palliative Care Screening - - Not Applicable  Medication Review Press photographer) Complete Complete Complete  PCP or Specialist appointment within 3-5 days of discharge Complete Complete -  [place] or Home Care Consult Complete Complete -  SW Recovery Care/Counseling Consult - Complete -  Palliative Care Screening Complete Complete -  [place] Patient Refused Patient Refused -  Some recent data might be hidden

## 2021-05-20 NOTE — Progress Notes (Signed)
   05/20/21 1610  Assess: MEWS Score  Temp (!) 97.5 F (36.4 C)  BP (!) 135/97  Pulse Rate (!) 105  ECG Heart Rate (!) 106  Resp (!) 24  SpO2 100 %  Assess: MEWS Score  MEWS Temp 0  MEWS Systolic 0  MEWS Pulse 1  MEWS RR 1  MEWS LOC 0  MEWS Score 2  MEWS Score Color Yellow  Assess: if the MEWS score is Yellow or Red  Were vital signs taken at a resting state? Yes  Focused Assessment Change from prior assessment (see assessment flowsheet)  Early Detection of Sepsis Score *See Row Information* Medium  MEWS guidelines implemented *See Row Information* Yes  Treat  MEWS Interventions Escalated (See documentation below)  Pain Scale 0-10  Pain Score 0  Take Vital Signs  Increase Vital Sign Frequency  Yellow: Q 2hr X 2 then Q 4hr X 2, if remains yellow, continue Q 4hrs  Escalate  MEWS: Escalate Yellow: discuss with charge nurse/RN and consider discussing with provider and RRT  Notify: Charge Nurse/RN  Name of Charge Nurse/RN Notified Liana, RN  Date Charge Nurse/RN Notified 05/20/21  Time Charge Nurse/RN Notified 1630

## 2021-05-20 NOTE — Progress Notes (Signed)
Patient returned to unit from dialysis at this time. VSS, CBG is 55. Patient is asymptomatic. Administered 4 ounces of apple juice with extra sugar. Will recheck.

## 2021-05-20 NOTE — Progress Notes (Signed)
Kirsten Mcmahon Progress Note   Subjective: Seen in room, moans when touched but otherwise lying with eyes closed in NAD. Mental status appears to wax and wane. NAD    Objective Vitals:   05/19/21 0746 05/19/21 1214 05/19/21 2009 05/20/21 0441  BP: 116/68 112/86 132/79 139/84  Pulse: 96 95 99 97  Resp: (!) 23 15 18 20   Temp: 98.4 F (36.9 C) (!) 97.3 F (36.3 C) 98.1 F (36.7 C) 97.9 F (36.6 C)  TempSrc: Oral Oral Oral Axillary  SpO2: 91% 100% 100% 100%  Weight:      Height:       Physical Exam General: chronically ill appearing female in NAD Heart: S1,S2 RRR No M/R/G SR 90s Lungs: CTAB slightly decreased in R base Abdomen: S, NT, ND Extremities: edema L hand, no LE edema Dialysis Access: [facility] Drsg intact    Additional Objective Labs: Basic Metabolic Panel: Recent Labs  Lab 05/18/21 1232 05/19/21 1003 05/20/21 0158  NA 143 135 137  K 4.3 4.7 3.4*  3.4*  CL 104 100 102  CO2 25 20* 23  GLUCOSE 129* 98 104*  BUN 39* 26* 38*  CREATININE 5.60* 3.61* 4.60*  CALCIUM 8.8* 7.8* 7.7*  PHOS 4.9* 2.8 3.3   Liver Function Tests: Recent Labs  Lab 05/17/21 0808 05/18/21 0112 05/18/21 1232 05/19/21 1003 05/20/21 0158  AST 28  --   --   --   --   ALT 16  --   --   --   --   ALKPHOS 87  --   --   --   --   BILITOT 1.8*  --   --   --   --   PROT 7.9  --   --   --   --   ALBUMIN 3.5   < > 2.9* 2.9* 2.4*   < > = values in this interval not displayed.   Recent Labs  Lab 05/17/21 0808  LIPASE 26   CBC: Recent Labs  Lab 05/13/21 1416 05/15/21 1117 05/16/21 0714 05/17/21 0808 05/17/21 1146 05/18/21 0112 05/18/21 1232  WBC 7.3  --  8.1 23.6*  --  16.4* 16.4*  NEUTROABS  --   --   --  22.0*  --   --   --   HGB 8.2*   < > 7.1* 10.2* 9.9* 9.0* 9.0*  HCT 26.5*   < > 23.8* 32.3* 29.0* 28.2* 28.6*  MCV 92.3  --  95.2 92.6  --  92.2 94.1  PLT 251  --  291 202  --  220 255   < > = values in this interval not displayed.   Blood Culture     Component Value Date/Time   SDES BLOOD LEFT FOREARM 05/17/2021 0953   SPECREQUEST  05/17/2021 0953    BOTTLES DRAWN AEROBIC AND ANAEROBIC Blood Culture adequate volume   CULT  05/17/2021 0953    NO GROWTH 3 DAYS Performed at [facility], [place] [address], [place], [place] [address]    REPTSTATUS PENDING 05/17/2021 6237    Cardiac Enzymes: No results for input(s): CKTOTAL, CKMB, CKMBINDEX, TROPONINI in the last 168 hours. CBG: Recent Labs  Lab 05/19/21 1630 05/19/21 2007 05/19/21 2322 05/20/21 0449 05/20/21 0736  GLUCAP 114* 94 97 88 81   Iron Studies: No results for input(s): IRON, TIBC, TRANSFERRIN, FERRITIN in the last 72 hours. @lablastinr3 @ Studies/Results: No results found. Medications:  sodium chloride 50 mL/hr at 05/19/21 1130  levETIRAcetam 500 mg (05/19/21 2259)   piperacillin-tazobactam (ZOSYN)  IV 2.25 g (05/20/21 0847)    (feeding supplement) PROSource Plus  30 mL Oral TID BM   aspirin EC  81 mg Oral Daily   atorvastatin  40 mg Oral Daily   Chlorhexidine Gluconate Cloth  6 each Topical Q0600   clopidogrel  75 mg Oral Daily   darbepoetin (ARANESP) injection - DIALYSIS  100 mcg Intravenous Q Mon-HD   feeding supplement (NEPRO CARB STEADY)  237 mL Oral BID BM   heparin  5,000 Units Subcutaneous Q8H   insulin aspart  0-6 Units Subcutaneous Q4H   latanoprost  1 drop Both Eyes QHS   letrozole  2.5 mg Oral Daily   levothyroxine  112 mcg Oral Q0600   metoprolol succinate  50 mg Oral Daily   midodrine  5 mg Oral Q M,W,F-HD   multivitamin  1 tablet Oral QHS   sodium chloride flush  3 mL Intravenous Q12H     Dialysis Orders: MWF  - GOC  4hrs, BFR 400, DFR 500,  EDW 55kg, 2K/ 2.5Ca   Access: TDC  Heparin none Mircera 99mcg IV q2wks - not given, to start 6/13   Assessment/Plan: 1. AMS - previous admission for same. Etiology unclear, likely multifactorial.  Recent acute CVA which neurology did not think was origin.  CT with possible PNA. WBC 23.6.  Recent bacteremia with no evidence of vegetations on TEE.  Continued GOC discussion with family would be beneficial. 2. Possible PNA - CT with air space disease, ?aspiration, family reported vomiting overnight. ABX started. Per primary 3. Leukocytosis - 23.6. CT with possible PNA.  N/v overnight.  On vanc/zosyn 4. Nausea/vomiting - per family vomiting over night. Work up per primary. 5. ESRD - on HD MWF.  Orders written for HD 05/20/21 per regular schedule. Will require sitter at outpatient dialysis due to behavior/safety issues. 6. Anemia of CKD- Hgb 9.0. Aranesp 100 mcg SQ 05/19/21 7. Secondary hyperparathyroidism - Ca and phos in goal.  Not on VDRA or binders. 8. HTN/volume - Has been on NS @ 75cc/hr since re-admission. Ran even on HD yesterday. HR down to 90s. Will decrease NS to 50cc and infuse for 12 more hours. Run even tomorrow.  9. Nutrition - Renal diet with fluid restrictions once advanced. 10. Sterptococcal bacteremia - noted on previous admission.  On Vanc with HD until 6/27.  TEE on 6/10 with no vegetations, +PFO. 11. Seizure disorder - on keppra 12. DMT2 - per primary 13. Nutrition: minimal PO intake. Has seen speech therapy and diet has been changed to D3 diet. Poor PO intake, continue IVF.  Kirsten Kley H. Kirsten Buddenhagen NP-C 05/20/2021, 9:56 AM  [employer] [phone]

## 2021-05-20 NOTE — Progress Notes (Signed)
Spoke with patients son, [name], for update. Gave him all available information that I had, as patient has been in dialysis for the entirety of this shift. He has requested for MD to call with update, as well as speak to SW regarding discharge planning.

## 2021-05-20 NOTE — Progress Notes (Signed)
  Speech Language Pathology Treatment: Dysphagia  Patient Details Name: Kirsten Mcmahon MRN: [id number] DOB: [birth year] Today's Date: 05/20/2021 Time: 2725-3664 SLP Time Calculation (min) (ACUTE ONLY): 24.85 min  Assessment / Plan / Recommendation Clinical Impression  Pt was seen during dinner for dysphagia treatment. She consumed a meal of mashed potatoes, green beans, roast [place], a roll, and thin liquids via straw. Pt demonstrated delayed throat clearing and a cough after the initial large boluses of green beans. Aq single cough was also noted with a large bolus of thin liquids via straw when pt's positioning was suboptimal. Pt stated that she was very hungry and bolus sizes improved after the initial four bites. No s/sx of aspiration were noted thereafter. Mastication and oral clearance were adequate. It is recommended that the pt's current diet of dysphagia 3 solids and thin liquids be continued with supervision to ensure observance of swallowing precautions.    HPI HPI: Pt is a 75 y.o. female presented to ED for AMS and episode of vomiting.  History is obtained from pt's son over the phone. He reports that shortly prior to admission he tried to feed the pt, but she started vomiting dark green material with foul odor and seemed as though she were choking on the emesis. CT head (05/17/21) with no acute findings, CXR (05/17/21) with no acute cardiopulmonary disease, CT abd (05/17/21) with patchy airspace disease R>L lung suspicious for PNA and possible fecal impaction. She was recently admitted for encephalopathy of unclear origin and acute stroke, discharged 05/16/21. PMH: ESRD on HD, HTN, HLD, CVA with residual left-sided hemiparesis, seizure disorder, hypothyroidism,  DM type II, streptococcal bacteremia on IV vancomycin with dialysis, and intermittent encephalopathy. Last BSE (04/21/21) revealed adequate oropharygeal swallow with Dysphagia 2/thin liquid diet recommended. Failed [facility] due to lethargy per  RN (05/18/21).      SLP Plan  Continue with current plan of care       Recommendations  Diet recommendations: Dysphagia 3 (mechanical soft);Thin liquid Liquids provided via: Cup;Straw Medication Administration: Crushed with puree Supervision: Staff to assist with self feeding;Full supervision/cueing for compensatory strategies Compensations: Minimize environmental distractions;Slow rate;Small sips/bites;Follow solids with liquid Postural Changes and/or Swallow Maneuvers: Seated upright 90 degrees                Oral Care Recommendations: Oral care BID;Staff/trained caregiver to provide oral care Follow up Recommendations: Skilled Nursing facility;24 hour supervision/assistance SLP Visit Diagnosis: Dysphagia, unspecified (R13.10) Plan: Continue with current plan of care       Brantley Naser I. Hardin Negus, [place], [place] Office number [phone] Pager [place] 05/20/2021, 5:44 PM

## 2021-05-20 NOTE — Progress Notes (Addendum)
[facility] PROGRESS NOTE    Progress Note  Kirsten Mcmahon  ZOX:[id number] DOB: 01-24-1946 DOA: 05/17/2021 PCP: [facility]     Brief Narrative:   Kirsten Mcmahon is an 75 y.o. 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  Patient in the ED he was afebrile with respiration of 25 satting greater 92% on room air White count 23,000 hemoglobin 10, chest x-ray showed no acute findings, CT of the head stable compared to previous.  MRI of the brain was significantly related to motion, showed a few untreated acute infarctions in the right temporal and parietal lobe and bilateral thalamus.   Awaiting skilled nursing facility placement. Assessment/Plan:  Acute metabolic encephalopathy : Continue aspirin and Statins PT rec SNF son refused. 2-D echo 04/21/2019 showed an EF of 55% with grade 1 diastolic heart failure. BP goal: permissive HTN upto 220/120 mmHg Telemetry monitoring.  Acute nausea and vomiting: Chest x-ray showed no acute findings. CT scan of the abdomen no acute process, but it did showed right greater than left patchy airspace disease. Fecal impactation. Started miralax.  SIRS: White blood cell count is trending down, T-max 98.3 she is currently on on zosyn. Will discontinue IV zosyn and continue IV vancomycin for her streptococcal bacteremia  ESRD: Last HD 6.11. Renal notified.  H/o Step Bacteremia : cont Vanc per pharmacy .  Anemia of chronic disease: Hbg stable compared to previous.  Essential hypertension: Cont  metoprolol, relative well controlled.  Seizure disorder: Cont keppra.  Non-insulin diabetes mellitus: Cont SSI.  Hypothyroidism: Cont synthroid.  Elevated ammonia level: Unlikely to be causing patient symptoms.  Stage II pressure ulcer present on admission: RN Pressure Injury Documentation: Pressure Injury 04/29/21 Buttocks Mid Stage 2 -  Partial thickness loss of dermis presenting as a shallow open injury with a red, pink wound bed without slough. (Active)  04/29/21 0045  Location: Buttocks  Location Orientation: Mid  Staging: Stage 2 -  Partial thickness loss of dermis presenting as a shallow open injury with a red, pink wound bed without slough.  Wound Description (Comments):   Present on Admission: Yes     DVT prophylaxis: lovenox Family Communication:Son Status is: Inpatient  Remains inpatient appropriate because:Hemodynamically unstable  Dispo: The patient is from: Home              Anticipated d/c is to: SNF              Patient currently is not medically stable to d/c.   Difficult to place patient No        Code Status:     Code Status Orders  (From admission, onward)           Start     Ordered   05/17/21 1231  Full code  Continuous        05/17/21 1231           Code Status History     Date Active Date Inactive Code Status Order ID Comments User Context   04/28/2021 1839 05/17/2021 0018 Full Code [id number]  Little Ishikawa, MD ED  04/09/2021 2102 04/24/2021 2217 Full Code [id number]  Elwyn Reach, MD Inpatient   01/17/2021 1921 01/21/2021 2109 Full Code [id number]  Ivor Costa, MD ED   07/15/2017 1640 07/16/2017 2032 Full Code [id number]  Armandina Gemma, MD Inpatient         IV Access:   Peripheral IV   Procedures and diagnostic studies:   No results found.   Medical Consultants:   None.   Subjective:    Kirsten Mcmahon no complaints  Objective:    Vitals:   05/19/21 2009 05/20/21 0441 05/20/21 0945 05/20/21  0953  BP: 132/79 139/84 121/72 114/72  Pulse: 99 97 88 91  Resp: 18 20    Temp: 98.1 F (36.7 C) 97.9 F (36.6 C) 97.9 F (36.6 C)   TempSrc: Oral Axillary Axillary   SpO2: 100% 100% 99%   Weight:   51.5 kg   Height:       SpO2: 99 %   Intake/Output Summary (Last 24 hours) at 05/20/2021 1028 Last data filed at 05/19/2021 1809 Gross per 24 hour  Intake 358.5 ml  Output --  Net 358.5 ml    Filed Weights   05/18/21 1355 05/18/21 1730 05/20/21 0945  Weight: 50.7 kg 50.5 kg 51.5 kg    Exam: General exam: In no acute distress. Respiratory system: Good air movement and clear to auscultation. Cardiovascular system: S1 & S2 heard, RRR. No JVD. Gastrointestinal system: Abdomen is nondistended, soft and nontender.  Extremities: No pedal edema. Skin: No rashes, lesions or ulcers  Data Reviewed:    Labs: Basic Metabolic Panel: Recent Labs  Lab 05/17/21 0808 05/17/21 0919 05/17/21 1146 05/18/21 0112 05/18/21 1232 05/19/21 1003 05/20/21 0158  NA 139  --  141 141 143 135 137  K 4.9  --  4.5 4.1 4.3 4.7 3.4*  3.4*  CL 100  --   --  102 104 100 102  CO2 24  --   --  25 25 20* 23  GLUCOSE 191*  --   --  139* 129* 98 104*  BUN 29*  --   --  37* 39* 26* 38*  CREATININE 4.70*  --   --  5.24* 5.60* 3.61* 4.60*  CALCIUM 9.3  --   --  9.0 8.8* 7.8* 7.7*  MG  --   --   --   --   --   --  1.8  PHOS  --  3.9  --  4.7* 4.9* 2.8 3.3    GFR Estimated Creatinine Clearance: 8.2 mL/min (A) (by C-G formula based on SCr of 4.6 mg/dL (H)). Liver Function Tests: Recent Labs  Lab 05/17/21 0808 05/18/21 0112 05/18/21 1232 05/19/21 1003 05/20/21 0158  AST 28  --   --   --   --   ALT 16  --   --   --   --   ALKPHOS 87  --   --   --   --   BILITOT 1.8*  --   --   --   --   PROT 7.9  --   --   --   --   ALBUMIN 3.5 3.0* 2.9* 2.9* 2.4*    Recent Labs  Lab 05/17/21 0808  LIPASE 26    Recent Labs  Lab 05/17/21 0808  AMMONIA 37*    Coagulation profile No results for  input(s): INR, PROTIME in the last 168 hours. COVID-19 Labs  No results for input(s): DDIMER, FERRITIN, LDH, CRP in the  last 72 hours.  Lab Results  Component Value Date   SARSCOV2NAA NEGATIVE 05/17/2021   SARSCOV2NAA NEGATIVE 04/28/2021   [place] NEGATIVE 04/09/2021   [place] NEGATIVE 01/17/2021    CBC: Recent Labs  Lab 05/13/21 1416 05/15/21 1117 05/16/21 0714 05/17/21 0808 05/17/21 1146 05/18/21 0112 05/18/21 1232  WBC 7.3  --  8.1 23.6*  --  16.4* 16.4*  NEUTROABS  --   --   --  22.0*  --   --   --   HGB 8.2*   < > 7.1* 10.2* 9.9* 9.0* 9.0*  HCT 26.5*   < > 23.8* 32.3* 29.0* 28.2* 28.6*  MCV 92.3  --  95.2 92.6  --  92.2 94.1  PLT 251  --  291 202  --  220 255   < > = values in this interval not displayed.    Cardiac Enzymes: No results for input(s): CKTOTAL, CKMB, CKMBINDEX, TROPONINI in the last 168 hours. BNP (last 3 results) No results for input(s): PROBNP in the last 8760 hours. CBG: Recent Labs  Lab 05/19/21 1630 05/19/21 2007 05/19/21 2322 05/20/21 0449 05/20/21 0736  GLUCAP 114* 94 97 88 81    D-Dimer: No results for input(s): DDIMER in the last 72 hours. Hgb A1c: No results for input(s): HGBA1C in the last 72 hours. Lipid Profile: No results for input(s): CHOL, HDL, LDLCALC, TRIG, CHOLHDL, LDLDIRECT in the last 72 hours. Thyroid function studies: No results for input(s): TSH, T4TOTAL, T3FREE, THYROIDAB in the last 72 hours.  Invalid input(s): FREET3 Anemia work up: No results for input(s): VITAMINB12, FOLATE, FERRITIN, TIBC, IRON, RETICCTPCT in the last 72 hours. Sepsis Labs: Recent Labs  Lab 05/16/21 0714 05/17/21 0808 05/17/21 0936 05/17/21 1138 05/18/21 0112 05/18/21 1232  WBC 8.1 23.6*  --   --  16.4* 16.4*  LATICACIDVEN  --   --  1.8 1.4  --   --     Microbiology Recent Results (from the past 240 hour(s))  Culture, blood (routine x 2)     Status: None (Preliminary result)   Collection Time: 05/17/21  9:48 AM    Specimen: BLOOD LEFT FOREARM  Result Value Ref Range Status   Specimen Description BLOOD LEFT FOREARM  Final   Special Requests   Final    BOTTLES DRAWN AEROBIC AND ANAEROBIC Blood Culture adequate volume   Culture   Final    NO GROWTH 3 DAYS Performed at [facility], [place] [address], [place], [place] [address]    Report Status PENDING  Incomplete  Culture, blood (routine x 2)     Status: None (Preliminary result)   Collection Time: 05/17/21  9:53 AM   Specimen: BLOOD LEFT FOREARM  Result Value Ref Range Status   Specimen Description BLOOD LEFT FOREARM  Final   Special Requests   Final    BOTTLES DRAWN AEROBIC AND ANAEROBIC Blood Culture adequate volume   Culture   Final    NO GROWTH 3 DAYS Performed at [facility], [place] [address], [place], [place] [address]    Report Status PENDING  Incomplete  Resp Panel by RT-PCR (Flu A&B, Covid) Nasopharyngeal Swab     Status: None   Collection Time: 05/17/21 11:28 AM   Specimen: Nasopharyngeal Swab; Nasopharyngeal(NP) swabs in vial transport medium  Result Value Ref Range Status   SARS Coronavirus 2 by RT PCR NEGATIVE NEGATIVE Final    Comment: (NOTE) SARS-CoV-2 target nucleic acids are NOT DETECTED.  The SARS-CoV-2 RNA is  generally detectable in upper respiratory specimens during the acute phase of infection. The lowest concentration of SARS-CoV-2 viral copies this assay can detect is 138 copies/mL. A negative result does not preclude SARS-Cov-2 infection and should not be used as the sole basis for treatment or other patient management decisions. A negative result may occur with  improper specimen collection/handling, submission of specimen other than nasopharyngeal swab, presence of viral mutation(s) within the areas targeted by this assay, and inadequate number of viral copies(<138 copies/mL). A negative result must be combined with clinical observations, patient history, and epidemiological information. The expected  result is Negative.  Fact Sheet for Patients:  EntrepreneurPulse.com.au  Fact Sheet for Healthcare Providers:  IncredibleEmployment.be  This test is no t yet approved or cleared by the [place] FDA and  has been authorized for detection and/or diagnosis of SARS-CoV-2 by FDA under an Emergency Use Authorization (EUA). This EUA will remain  in effect (meaning this test can be used) for the duration of the COVID-19 declaration under Section 564(b)(1) of the Act, 21 U.S.C.section 360bbb-3(b)(1), unless the authorization is terminated  or revoked sooner.       Influenza A by PCR NEGATIVE NEGATIVE Final   Influenza B by PCR NEGATIVE NEGATIVE Final    Comment: (NOTE) The Xpert Xpress SARS-CoV-2/FLU/RSV plus assay is intended as an aid in the diagnosis of influenza from Nasopharyngeal swab specimens and should not be used as a sole basis for treatment. Nasal washings and aspirates are unacceptable for Xpert Xpress SARS-CoV-2/FLU/RSV testing.  Fact Sheet for Patients: EntrepreneurPulse.com.au  Fact Sheet for Healthcare Providers: IncredibleEmployment.be  This test is not yet approved or cleared by the [place] FDA and has been authorized for detection and/or diagnosis of SARS-CoV-2 by FDA under an Emergency Use Authorization (EUA). This EUA will remain in effect (meaning this test can be used) for the duration of the COVID-19 declaration under Section 564(b)(1) of the Act, 21 U.S.C. section 360bbb-3(b)(1), unless the authorization is terminated or revoked.  Performed at [facility], [place] [address] [place], [place] [address]      Medications:    (feeding supplement) PROSource Plus  30 mL Oral TID BM   aspirin EC  81 mg Oral Daily   atorvastatin  40 mg Oral Daily   Chlorhexidine Gluconate Cloth  6 each Topical Q0600   clopidogrel  75 mg Oral Daily   darbepoetin (ARANESP) injection -  DIALYSIS  100 mcg Intravenous Q Mon-HD   feeding supplement (NEPRO CARB STEADY)  237 mL Oral BID BM   heparin  5,000 Units Subcutaneous Q8H   insulin aspart  0-6 Units Subcutaneous Q4H   latanoprost  1 drop Both Eyes QHS   letrozole  2.5 mg Oral Daily   levothyroxine  112 mcg Oral Q0600   metoprolol succinate  50 mg Oral Daily   midodrine       midodrine  5 mg Oral Q M,W,F-HD   multivitamin  1 tablet Oral QHS   sodium chloride flush  3 mL Intravenous Q12H   Continuous Infusions:  sodium chloride 50 mL/hr at 05/19/21 1130   levETIRAcetam 500 mg (05/19/21 2259)   piperacillin-tazobactam (ZOSYN)  IV 2.25 g (05/20/21 0847)      LOS: 3 days   Charlynne Cousins  [facility]  05/20/2021, 10:28 AM

## 2021-05-20 NOTE — Progress Notes (Signed)
SLP Cancellation Note  Patient Details Name: Kirsten Mcmahon MRN: [id number] DOB: 08/13/1946   Cancelled treatment:       Reason Eval/Treat Not Completed: Patient at procedure or test/unavailable (Pt off unit for HD. SLP will follow up as schedule allows)  Robert Sunga I. Hardin Negus, [place], [place] Office number [phone] Pager [place] 05/20/2021, 10:08 AM

## 2021-05-20 NOTE — Progress Notes (Signed)
Attempted to contact daughter, Maretta Los, for update. Left message to return call.

## 2021-05-20 NOTE — Progress Notes (Signed)
Nutrition Follow-up  DOCUMENTATION CODES:   Not applicable  INTERVENTION:   -Continue Renal MVI daily -Continue Nepro Shake po BID, each supplement provides 425 kcal and 19 grams protein  -Continue 30 ml Prosource Plus TID, each supplement provides 100 kcals and 15 grams protein -Continue feeding assistance with meals   NUTRITION DIAGNOSIS:   Increased nutrient needs related to chronic illness (ESRD on HD) as evidenced by estimated needs.  Ongoing  GOAL:   Patient will meet greater than or equal to 90% of their needs  Progressing   MONITOR:   PO intake, Supplement acceptance, Diet advancement, Labs, Weight trends, Skin, I & O's  REASON FOR ASSESSMENT:   Malnutrition Screening Tool    ASSESSMENT:   Kirsten Mcmahon is a 75 y.o. female with medical history significant of ESRD on HD, HTN, HLD, CVA with residual left-sided hemiparesis, seizure disorder, hypothyroidism,  DM type II, streptococcal bacteremia on IV vancomycin with dialysis, and intermittent encephalopathy presents for altered mental status and episode of vomiting.  History is obtained from talks with the patient's son over the phone.  After patient had been able to be transported home yesterday.  Son states that the patient was not talking, would only moving her head back and forth, and was unwilling to eat.  She seemed like she was still heavily sedated, and he wondered if it was related to her having the TTE on 6/10.  He states that he tried to let her rest and this morning around 4 AM and tried to feed the patient, but she started vomiting up this dark green material with foul odor all over the place and seemed as though she were choking on the emesis.  He immediately called 911.  He is unsure of any blood was present but states there might have possibly been some blood.  She had last had hemodialysis on 6/11 prior to discharge.  6/13- s/p BSE- advanced to a dysphagia 2 diet with thin liquids 6/14- s/p BSE-  advanced to dysphagia 3 diet with thin liquids  Reviewed I/O's: +359 ml x 24 hours and +1.4 L since admission   Pt out of room at time of visit.   Observed breakfast tray, which was untouched. Pt consumed about 1/3 cup of applesauce. Per MAR, pt is taking Prosource and Nepro supplements.   Per MD notes, considering palliative care consult for goals of care discussions.   Labs reviewed: K: 3.4, CBGS: 81-97 (inpatient orders for glycemic control are 0-6 units insulin aspart every 4 hours).    Diet Order:   Diet Order             DIET DYS 3 Room service appropriate? No; Fluid consistency: Thin  Diet effective now                   EDUCATION NEEDS:   Not appropriate for education at this time  Skin:  Skin Assessment: Skin Integrity Issues: Skin Integrity Issues:: Stage II Stage II: buttocks Other: -  Last BM:  05/19/21  Height:   Ht Readings from Last 1 Encounters:  05/18/21 5\' 2"  (1.575 m)    Weight:   Wt Readings from Last 1 Encounters:  05/20/21 51.5 kg    Ideal Body Weight:  50 kg  BMI:  Body mass index is 20.77 kg/m.  Estimated Nutritional Needs:   Kcal:  1650-1850  Protein:  85-100 grams  Fluid:  1000 ml + UOP    [name], RD, LDN, CDCES Registered  Dietitian II Certified Diabetes Care and Education Specialist Please refer to [facility] for RD and/or RD on-call/weekend/after hours pager

## 2021-05-21 DIAGNOSIS — G9341 Metabolic encephalopathy: Secondary | ICD-10-CM | POA: Diagnosis not present

## 2021-05-21 DIAGNOSIS — I10 Essential (primary) hypertension: Secondary | ICD-10-CM | POA: Diagnosis not present

## 2021-05-21 DIAGNOSIS — N186 End stage renal disease: Secondary | ICD-10-CM | POA: Diagnosis not present

## 2021-05-21 DIAGNOSIS — R7881 Bacteremia: Secondary | ICD-10-CM | POA: Diagnosis not present

## 2021-05-21 LAB — CBC WITH DIFFERENTIAL/PLATELET
Abs Immature Granulocytes: 0.15 10*3/uL — ABNORMAL HIGH (ref 0.00–0.07)
Basophils Absolute: 0 10*3/uL (ref 0.0–0.1)
Basophils Relative: 1 %
Eosinophils Absolute: 0.4 10*3/uL (ref 0.0–0.5)
Eosinophils Relative: 5 %
HCT: 24 % — ABNORMAL LOW (ref 36.0–46.0)
Hemoglobin: 7.7 g/dL — ABNORMAL LOW (ref 12.0–15.0)
Immature Granulocytes: 2 %
Lymphocytes Relative: 20 %
Lymphs Abs: 1.6 10*3/uL (ref 0.7–4.0)
MCH: 29.2 pg (ref 26.0–34.0)
MCHC: 32.1 g/dL (ref 30.0–36.0)
MCV: 90.9 fL (ref 80.0–100.0)
Monocytes Absolute: 0.9 10*3/uL (ref 0.1–1.0)
Monocytes Relative: 11 %
Neutro Abs: 4.9 10*3/uL (ref 1.7–7.7)
Neutrophils Relative %: 61 %
Platelets: 251 10*3/uL (ref 150–400)
RBC: 2.64 MIL/uL — ABNORMAL LOW (ref 3.87–5.11)
RDW: 14.6 % (ref 11.5–15.5)
WBC: 7.9 10*3/uL (ref 4.0–10.5)
nRBC: 0 % (ref 0.0–0.2)

## 2021-05-21 LAB — RENAL FUNCTION PANEL
Albumin: 2.2 g/dL — ABNORMAL LOW (ref 3.5–5.0)
Anion gap: 9 (ref 5–15)
BUN: 13 mg/dL (ref 8–23)
CO2: 26 mmol/L (ref 22–32)
Calcium: 7.5 mg/dL — ABNORMAL LOW (ref 8.9–10.3)
Chloride: 100 mmol/L (ref 98–111)
Creatinine, Ser: 2.63 mg/dL — ABNORMAL HIGH (ref 0.44–1.00)
GFR, Estimated: 18 mL/min — ABNORMAL LOW (ref >60)
Glucose, Bld: 95 mg/dL (ref 70–99)
Phosphorus: 2 mg/dL — ABNORMAL LOW (ref 2.5–4.6)
Potassium: 3.5 mmol/L (ref 3.5–5.1)
Sodium: 135 mmol/L (ref 135–145)

## 2021-05-21 LAB — GLUCOSE, CAPILLARY
Glucose-Capillary: 135 mg/dL — ABNORMAL HIGH (ref 70–99)
Glucose-Capillary: 217 mg/dL — ABNORMAL HIGH (ref 70–99)
Glucose-Capillary: 37 mg/dL — CL (ref 70–99)
Glucose-Capillary: 72 mg/dL (ref 70–99)
Glucose-Capillary: 76 mg/dL (ref 70–99)
Glucose-Capillary: 89 mg/dL (ref 70–99)
Glucose-Capillary: 90 mg/dL (ref 70–99)
Glucose-Capillary: 98 mg/dL (ref 70–99)

## 2021-05-21 MED ORDER — VANCOMYCIN HCL 500 MG/100ML IV SOLN
500.0000 mg | INTRAVENOUS | Status: DC
  Filled 2021-05-21: qty 100

## 2021-05-21 MED ORDER — VANCOMYCIN HCL 1000 MG/200ML IV SOLN
1000.0000 mg | Freq: Once | INTRAVENOUS | Status: AC
  Administered 2021-05-21: 1000 mg via INTRAVENOUS
  Filled 2021-05-21: qty 200

## 2021-05-21 MED ORDER — LEVETIRACETAM ER 500 MG PO TB24
500.0000 mg | ORAL_TABLET | Freq: Every evening | ORAL | Status: DC
  Filled 2021-05-21: qty 1

## 2021-05-21 MED ORDER — DEXTROSE 10 % IV SOLN: INTRAVENOUS | Status: DC

## 2021-05-21 MED ORDER — LEVETIRACETAM 500 MG PO TABS
500.0000 mg | ORAL_TABLET | Freq: Every evening | ORAL | Status: DC
  Administered 2021-05-22 – 2021-05-23 (×2): 500 mg via ORAL
  Filled 2021-05-21 (×3): qty 1

## 2021-05-21 NOTE — Progress Notes (Signed)
Got called by the nurses that she had a bloody bowel movement went up to the renal she had about half a cup of blood, she does have a history of hemorrhoids. Her aspirin and her heparin will discontinued. There is probably hemorrhoidal bleed CBC is pending at this time we will check a CBC tomorrow morning.

## 2021-05-21 NOTE — Progress Notes (Signed)
  Speech Language Pathology Treatment: Dysphagia  Patient Details Name: Kirsten Mcmahon MRN: [id number] DOB: 10/21/1946 Today's Date: 05/21/2021 Time: 7035-0093 SLP Time Calculation (min) (ACUTE ONLY): 19.38 min  Assessment / Plan / Recommendation Clinical Impression  Pt was seen for dysphagia treatment. Kirsten Billings, RN reported that the pt has been difficult to rouse today and p.o. intake has therefore been limited. Pt was being fed breakfast by NT upon SLP's arrival. Pt's NT denied any coughing with p.o. intake, but a wet vocal quality was noted by SLP which was eliminated with coughing. Pt exhibited difficulty maintaining alertness throughout the session and required additional intermittent verbal/tactile stimulation. Mastication was more prolonged than was noted yesterday and multiple swallows were observed, but no significant oral residue was noted. Coughing was intermittently noted during intake of solids. The impact of her lethargy on this is considered, but inconsistent coughing was also observed yesterday when her alertness was improved. A modified barium swallow study is recommended to further assess swallow function and it will be planned for tomorrow.    HPI HPI: Pt is a 75 y.o. female presented to ED for AMS and episode of vomiting.Pt's son reported that shortly prior to admission he tried to feed the pt, but she started vomiting dark green material with foul odor and seemed as though she were choking on the emesis. CT head (05/17/21) with no acute findings, CXR (05/17/21) with no acute cardiopulmonary disease, CT abd (05/17/21) with patchy airspace disease R>L lung suspicious for PNA and possible fecal impaction. She was recently admitted for encephalopathy of unclear origin and acute stroke, discharged 05/16/21. PMH: ESRD on HD, HTN, HLD, CVA with residual left-sided hemiparesis, seizure disorder, hypothyroidism,  DM type II, streptococcal bacteremia on IV vancomycin with dialysis, and intermittent  encephalopathy. Last BSE (04/21/21) revealed adequate oropharygeal swallow with Dysphagia 2/thin liquid diet recommended. Failed [facility] due to lethargy per RN (05/18/21).      SLP Plan  MBS       Recommendations  Diet recommendations: Dysphagia 3 (mechanical soft);Thin liquid Liquids provided via: Cup;Straw Medication Administration: Crushed with puree Supervision: Staff to assist with self feeding;Full supervision/cueing for compensatory strategies Compensations: Minimize environmental distractions;Slow rate;Small sips/bites;Follow solids with liquid Postural Changes and/or Swallow Maneuvers: Seated upright 90 degrees                Oral Care Recommendations: Oral care BID;Staff/trained caregiver to provide oral care Follow up Recommendations: Skilled Nursing facility;24 hour supervision/assistance SLP Visit Diagnosis: Dysphagia, unspecified (R13.10) Plan: MBS       Kirsten Mcmahon I. Hardin Negus, [place], [place] Office number [phone] Pager [place] 05/21/2021, 12:47 PM

## 2021-05-21 NOTE — Significant Event (Signed)
Rapid Response Event Progress Note   Called for pt unresponsive to sternal rub. CBG 37. RRRN told to give D50 while on the phone. On arrival, patient responded to voice (drowsy at baseline), oriented x1 (baseline), followed commands on right side (baseline. left sided deficit/weakness post old stroke). Skin cool and dry. Palpable pulses bilaterally. Follow-up CBG 217.  VS: BP 137/73, HR 93, RR 24, O2 100% RA, T 98.3  Presumed hypoglycemia from poor PO intake. RN to reach out to MD to ask about dextrose maintenance fluids to regulate CBGs.  Event Summary:   MD Notified: Bedside RN to discuss with MD Call Time: 0805 Arrival Time: 0807 End Time: 0820  Netta Corrigan, RN

## 2021-05-21 NOTE — Care Management Important Message (Signed)
Important Message  Patient Details  Name: Kirsten Mcmahon MRN: [id number] Date of Birth: 05/07/1946   Medicare Important Message Given:  Yes - Important Message mailed due to current National Emergency   Verbal consent obtained due to current National Emergency  Relationship to patient: Self Contact Name: Arwilda Call Date: 05/21/21  Time: 1532 Phone: [phone] Outcome: No Answer/Busy Important Message mailed to: Patient address on file    Delorse Lek 05/21/2021, 3:33 PM

## 2021-05-21 NOTE — Progress Notes (Addendum)
[facility] Progress Note   Subjective: Seen in room. BS in 30s this AM requiring D10w. Poor PO intake. HD yesterday, tolerated without events. Oriented to self only, answering simple questions appropriately.  Objective Vitals:   05/20/21 1939 05/20/21 2349 05/21/21 0338 05/21/21 0813  BP: 130/69 (!) 132/91 115/71   Pulse: 98 93 83   Resp: 20 (!) 22 15   Temp: 98 F (36.7 C) 97.9 F (36.6 C) 98 F (36.7 C) 98.3 F (36.8 C)  TempSrc: Oral Oral Oral Oral  SpO2: 98% 100% 100%   Weight:      Height:       Physical Exam General: chronically ill appearing female in NAD Heart: S1,S2 RRR No M/R/G SR 90s Lungs: CTAB slightly decreased in R base Abdomen: S, NT, ND Extremities: edema L hand, no LE edema Dialysis Access: RIJ [facility] Drsg intact      Additional Objective Labs: Basic Metabolic Panel: Recent Labs  Lab 05/19/21 1003 05/20/21 0158 05/21/21 0043  NA 135 137 135  K 4.7 3.4*  3.4* 3.5  CL 100 102 100  CO2 20* 23 26  GLUCOSE 98 104* 95  BUN 26* 38* 13  CREATININE 3.61* 4.60* 2.63*  CALCIUM 7.8* 7.7* 7.5*  PHOS 2.8 3.3 2.0*   Liver Function Tests: Recent Labs  Lab 05/17/21 0808 05/18/21 0112 05/19/21 1003 05/20/21 0158 05/21/21 0043  AST 28  --   --   --   --   ALT 16  --   --   --   --   ALKPHOS 87  --   --   --   --   BILITOT 1.8*  --   --   --   --   PROT 7.9  --   --   --   --   ALBUMIN 3.5   < > 2.9* 2.4* 2.2*   < > = values in this interval not displayed.   Recent Labs  Lab 05/17/21 0808  LIPASE 26   CBC: Recent Labs  Lab 05/16/21 0714 05/17/21 0808 05/17/21 1146 05/18/21 0112 05/18/21 1232  WBC 8.1 23.6*  --  16.4* 16.4*  NEUTROABS  --  22.0*  --   --   --   HGB 7.1* 10.2* 9.9* 9.0* 9.0*  HCT 23.8* 32.3* 29.0* 28.2* 28.6*  MCV 95.2 92.6  --  92.2 94.1  PLT 291 202  --  220 255   Blood Culture    Component Value Date/Time   SDES BLOOD LEFT FOREARM 05/17/2021 0953   SPECREQUEST  05/17/2021 0953    BOTTLES DRAWN  AEROBIC AND ANAEROBIC Blood Culture adequate volume   CULT  05/17/2021 0953    NO GROWTH 4 DAYS Performed at [facility], [place] [address], [place], [place] [address]    REPTSTATUS PENDING 05/17/2021 8416    Cardiac Enzymes: No results for input(s): CKTOTAL, CKMB, CKMBINDEX, TROPONINI in the last 168 hours. CBG: Recent Labs  Lab 05/20/21 2352 05/21/21 0021 05/21/21 0340 05/21/21 0804 05/21/21 0810  GLUCAP 65* 72 76 37* 217*   Iron Studies: No results for input(s): IRON, TIBC, TRANSFERRIN, FERRITIN in the last 72 hours. @lablastinr3 @ Studies/Results: No results found. Medications:  sodium chloride 50 mL/hr at 05/21/21 0522   levETIRAcetam 500 mg (05/20/21 2311)   piperacillin-tazobactam (ZOSYN)  IV 2.25 g (05/21/21 0824)    (feeding supplement) PROSource Plus  30 mL Oral TID BM   aspirin EC  81 mg Oral Daily  atorvastatin  40 mg Oral Daily   Chlorhexidine Gluconate Cloth  6 each Topical Q0600   clopidogrel  75 mg Oral Daily   darbepoetin (ARANESP) injection - DIALYSIS  100 mcg Intravenous Q Mon-HD   feeding supplement (NEPRO CARB STEADY)  237 mL Oral BID BM   heparin  5,000 Units Subcutaneous Q8H   insulin aspart  0-6 Units Subcutaneous Q4H   latanoprost  1 drop Both Eyes QHS   letrozole  2.5 mg Oral Daily   levothyroxine  112 mcg Oral Q0600   metoprolol succinate  50 mg Oral Daily   midodrine  5 mg Oral Q M,W,F-HD   multivitamin  1 tablet Oral QHS   sodium chloride flush  3 mL Intravenous Q12H     Dialysis Orders: MWF  - GOC  4hrs, BFR 400, DFR 500,  EDW 55kg, 2K/ 2.5Ca   Access: TDC  Heparin none Mircera 79mcg IV q2wks - not given, to start 6/13   Assessment/Plan: 1. AMS - previous admission for same. Etiology unclear, likely multifactorial.  Recent acute CVA which neurology did not think was origin.  CT with possible PNA. WBC 23.6. Recent bacteremia with no evidence of vegetations on TEE.  Continued GOC discussion with family would be beneficial. 2.  Possible PNA - CT with air space disease, ?aspiration, family reported vomiting overnight. ABX started. Per primary 3. Leukocytosis - 23.6. CT with possible PNA.  N/v overnight.  On vanc/zosyn 4. Nausea/vomiting - per family vomiting over night. Work up per primary. Resolved.  5. ESRD - on HD MWF.  Orders written for HD 05/22/21 per regular schedule. Will require sitter at outpatient dialysis due to behavior/safety issues. 6. Anemia of CKD- Hgb 9.0. Aranesp 100 mcg SQ 05/19/21. Follow HGB. 7. Secondary hyperparathyroidism - Ca and phos in goal.  Not on VDRA or binders. 8. HTN/volume - Has been on NS @ 75cc/hr since re-admission. Ran even on HD yesterday. HR down to 90s. Will decrease NS to 50cc and infuse for 12 more hours. Run even tomorrow.  9. Nutrition - Nutrition: minimal PO intake. Has seen speech therapy and diet has been changed to D3 diet. Poor PO intake. On protein supps. 10. Sterptococcal bacteremia - noted on previous admission.  On Vanc with HD until 6/27.  TEE on 6/10 with no vegetations, +PFO. 11. Seizure disorder - on keppra 12. DMT2 - per primary. Issues with hypoglycemia. Start d10w @ 40cc/hr until BS stable.   Martavia Tye H. Ronnesha Mester NP-C 05/21/2021, 9:44 AM  [employer] [phone]

## 2021-05-21 NOTE — Discharge Summary (Deleted)
Physician Discharge Summary  Kirsten Mcmahon AJO:[id number] DOB: [birth year] DOA: 05/17/2021  PCP: [place] date: 05/17/2021 Discharge date: 05/21/2021  Admitted From: Home Disposition:  Home  Recommendations for Outpatient Follow-up:  Follow up with PCP in 1-2 weeks Follow up with cardiology in 2 for holter monitor. Please obtain BMP/CBC in one week Please follow up on the following pending results:  Home Health:yes Equipment/Devices:None  Discharge Condition:Stable (Stable, guarded, hospice) CODE STATUS:full Diet recommendation:  Dysphagia 3 diet  Brief/Interim Summary: 75 y.o. 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  Patient in the ED he was afebrile with respiration of 25 satting greater 92% on room air White count 23,000 hemoglobin 10, chest x-ray showed no acute findings, CT of the head stable compared to previous.  MRI of the brain was significantly related to motion, showed a few untreated acute infarctions in the right temporal and parietal lobe and bilateral thalamus.  Discharge Diagnoses:  Principal Problem:   Acute metabolic encephalopathy Active Problems:   Hypertension   Hypothyroidism   Anemia in chronic kidney disease (CKD)   Type II diabetes mellitus with renal manifestations ([place])   ESRD (end stage renal disease) ([place])   Leukocytosis   Streptococcal bacteremia   Recent cerebrovascular accident (CVA)   History of CVA with residual deficit  Acute encephalopathy : MRI was done it was motion degraded. Her  encephalopathy could also be due to aspiration pneumonia for as she had a white count she was started empirically on IV vancomycin and Zosyn her encephalopathy resolved. She was continued on aspirin and statins. Doppler of the carotids were negative for clots. Physical therapy and Occupational Therapy evaluated the patient and recommended skilled nursing facility.  This was related to the son which refused skilled nursing facility he would like to go home with home health. On the last admission she was scheduled to have Holter monitor as an outpatient we will continue outpatient follow-up with neurology and cardiology A 2D echo was done that showed an EF of 94% grade 1 diastolic heart failure. She had no events on telemetry. The son has unrealistic expectation her.  Her mom's outcome skilled was offered he refused. Palliative Care to meet with patient and family at home to discuss end-of-life.  Acute nausea and vomiting constipation: Chest x-ray showed no acute findings CT scan of the abdomen showed fecal implantation she was given several doses of enemas with multiple bowel movements she was also started on MiraLAX.  SIRS: White blood cell count trending down with a T-max of 98.3 she is currently on Vanco and Zosyn and this was discontinued as she remained afebrile.  Question of her leukocytosis likely due to constipation.  Culture data from 05/17/2020 has remained negative till date.  End-stage renal disease: She was continued on her regular HD renal was consulted she got dialyzed twice in the hospital.  Anemia chronic renal disease: Hemoglobin is stable.  Essential hypertension: No change made to her medication.  Seizure disorder: Continue Keppra no changes made.  Non-insulin diabetes mellitus type 2: Her insulin was discontinued she was having  hypoglycemia.  Hypothyroidism: Continue Synthroid.  Mildly elevated ammonium level: Likely to be causing her patient  symptoms.  Discharge Instructions  Discharge Instructions     Diet - low sodium heart healthy   Complete by: As directed    Increase activity slowly   Complete by: As directed    No wound care   Complete by: As directed       Allergies as of 05/21/2021       Reactions   Contrast Media [iodinated Diagnostic Agents]    Other reaction(s): NO ALLERGY   Latex    Other reaction(s): NO ALLERGY   Shellfish-derived Products    Other reaction(s): NO ALLERGY   Levemir [insulin Detemir] Itching        Medication List     TAKE these medications    aspirin EC 81 MG tablet Take 81 mg by mouth daily.   atorvastatin 40 MG tablet Commonly known as: LIPITOR Take 1 tablet (40 mg total) by mouth daily.   cholecalciferol 1000 units tablet Commonly known as: VITAMIN D Take 1,000 Units by mouth daily.   Keppra XR 500 MG 24 hr tablet Generic drug: levETIRAcetam Take 500 mg by mouth daily.   latanoprost 0.005 % ophthalmic solution Commonly known as: XALATAN Place 1 drop into both eyes at bedtime.   letrozole 2.5 MG tablet Commonly known as: FEMARA Take 1 tablet (2.5 mg total) by mouth daily.   levothyroxine 112 MCG tablet Commonly known as: SYNTHROID Take 1 tablet (112 mcg total) by mouth daily before breakfast.   metoprolol succinate 25 MG 24 hr tablet Commonly known as: TOPROL-XL Take 50 mg by mouth daily.   midodrine 5 MG tablet Commonly known as: PROAMATINE Take 1 tablet (5 mg total) by mouth every Monday, Wednesday, and Friday with hemodialysis.   vancomycin 500 MG/100ML IVPB Commonly known as: VANCOREADY Inject 100 mLs (500 mg total) into the vein every Monday, Wednesday, and Friday with hemodialysis.        Allergies  Allergen Reactions   Contrast Media [Iodinated Diagnostic Agents]     Other reaction(s): NO ALLERGY   Latex     Other reaction(s): NO ALLERGY   Shellfish-Derived Products     Other reaction(s): NO ALLERGY   Levemir [Insulin Detemir]  Itching    Consultations: none   Procedures/Studies: CT ABDOMEN PELVIS WO CONTRAST  Result Date: 05/17/2021 CLINICAL DATA:  Altered mental status. Intra-abdominal infection. On antibiotics for bacteremia. End-stage renal disease. Vomiting. EXAM: CT ABDOMEN AND PELVIS WITHOUT CONTRAST TECHNIQUE: Multidetector CT imaging of the abdomen and pelvis was performed following the standard protocol without IV contrast. COMPARISON:  None. FINDINGS: Lower chest: Right greater than left base patchy airspace disease. Cardiomegaly. Large bore central line terminating at the low right atrium. Hepatobiliary: Multifactorial degradation, including patient arm position and lack of oral or IV contrast. Grossly normal noncontrast appearance of the liver. Multiple gallstones without acute cholecystitis or biliary duct dilatation. Pancreas: Normal, without mass or ductal dilatation. Spleen: Normal in size, without focal abnormality. Adrenals/Urinary Tract: Left greater than right adrenal thickening, without dominant mass. Moderate bilateral renal cortical thinning. Left renal vascular calcifications. An interpolar left renal 1.0 cm low-density lesion is suspected, likely a cyst. No hydronephrosis. No hydroureter or ureteric calculi. No bladder calculi. Stomach/Bowel: Normal stomach, without wall thickening. Stool within the rectum of 6.2 cm. No obstruction. Scattered colonic diverticula. Normal terminal ileum and appendix. Normal small bowel. Vascular/Lymphatic: Aortic atherosclerosis. No abdominopelvic adenopathy. Reproductive: Hysterectomy.  No adnexal mass. Other:  No significant free fluid. No free intraperitoneal air. Mild pelvic floor laxity. Multiple soft tissue density nodules within the anterior pelvic wall are likely injection sites. Musculoskeletal: Lumbosacral spondylosis. IMPRESSION: 1. Multifactorial degradation, including patient arm position and lack of oral or IV contrast. 2. Given this factor, no acute process  in the abdomen or pelvis. 3. Cholelithiasis. 4. Right greater than left base patchy airspace disease, suspicious for pneumonia. 5. Possible fecal impaction. 6. Aortic Atherosclerosis (ICD10-I70.0). Electronically Signed   By: Abigail Miyamoto M.D.   On: 05/17/2021 14:47   CT Head Wo Contrast  Result Date: 05/17/2021 CLINICAL DATA:  Mental status change with unknown cause. EXAM: CT HEAD WITHOUT CONTRAST TECHNIQUE: Contiguous axial images were obtained from the base of the skull through the vertex without intravenous contrast. COMPARISON:  05/07/2021 FINDINGS: Brain: Patchy remote bilateral cerebellar infarction. Remote right MCA distribution infarct with extensive gliosis and wallerian degeneration seen into the brainstem. Confluent small-vessel ischemic type gliosis in the cerebral white matter. No hemorrhage, hydrocephalus, or masslike finding. Vascular: No hyperdense vessel or unexpected calcification. Skull: Right craniectomy, presumably for cytotoxic edema decompression, with stable sunken appearance. Sinuses/Orbits: Negative IMPRESSION: 1. Stable compared to recent CT. Acute infarcts by brain MRI are occult by CT. 2. Extensive chronic ischemic injury. Electronically Signed   By: Monte Fantasia M.D.   On: 05/17/2021 09:25   CT HEAD WO CONTRAST  Result Date: 05/07/2021 CLINICAL DATA:  Encephalopathy EXAM: CT HEAD WITHOUT CONTRAST TECHNIQUE: Contiguous axial images were obtained from the base of the skull through the vertex without intravenous contrast. COMPARISON:  None. FINDINGS: Brain: There is no mass, hemorrhage or extra-axial collection. There is generalized atrophy without lobar predilection. Hypodensity of the white matter is most commonly associated with chronic microvascular disease. Old right MCA territory infarct. Vascular: No abnormal hyperdensity of the major intracranial arteries or dural venous sinuses. No intracranial atherosclerosis. Skull: Remote right-sided craniectomy Sinuses/Orbits: No  fluid levels or advanced mucosal thickening of the visualized paranasal sinuses. No mastoid or middle ear effusion. The orbits are normal. IMPRESSION: 1. No acute intracranial abnormality. 2. Old right MCA territory infarct and findings of chronic microvascular ischemia. Electronically Signed   By: Ulyses Jarred M.D.   On: 05/07/2021 19:47   MR ANGIO HEAD WO CONTRAST  Result Date: 05/13/2021 CLINICAL DATA:  Neuro deficit, acute, stroke suspected. EXAM: MRA HEAD WITHOUT CONTRAST TECHNIQUE: Angiographic images of the Circle of Willis were acquired using MRA technique without intravenous contrast. COMPARISON:  MRI of the brain May 12, 2021. FINDINGS: The study is severely degraded by motion. Most images are essentially nondiagnostic. Anterior circulation: Correlated identified in the intracranial internal carotid arteries and proximal M1 and A1 segments. Posterior circulation: Flow related enhancement noted in the basilar artery and bilateral vertebral arteries Other: None. IMPRESSION: 1. Severely degraded study due to motion artifact. Most images are essentially nondiagnostic. 2. Flow related enhancement seen in the intracranial internal carotid arteries, proximal bilateral M1/MCA segments and A1/ACA segments. 3. Flow related enhancement in the bilateral vertebral arteries and basilar artery. 4. Remainder of the vessels cannot be evaluated. Electronically Signed   By: Pedro Earls M.D.   On: 05/13/2021 12:48   MR BRAIN WO CONTRAST  Result Date: 05/12/2021 CLINICAL DATA:  Encephalopathy EXAM: MRI HEAD WITHOUT CONTRAST TECHNIQUE: Multiplanar, multiecho pulse sequences of the brain and surrounding structures were obtained without intravenous contrast. COMPARISON:  01/17/2021 FINDINGS: DWI, sagittal T1, and axial T2 sequences were attempted. There is significant motion artifact. Brain: Punctate  foci of reduced diffusion are present in the right temporal lobe, right thalamus, left thalamus, and left  parietal lobe. Large right MCA territory infarction. Wallerian degeneration along the right cerebral peduncle. Additional scattered smaller chronic infarcts. No significant mass effect. No hydrocephalus. Vascular: Major vessel flow voids at the skull base grossly preserved. Skull and upper cervical spine: Normal marrow signal is grossly preserved. Sinuses/Orbits: Paranasal sinuses are aerated. Orbits are grossly unremarkable. Other: None. IMPRESSION: Significantly motion degraded study. Patient could not tolerate all sequences. Few punctate acute infarcts of right temporal lobe, bilateral thalamus, and left parietal lobe. Electronically Signed   By: Macy Mis M.D.   On: 05/12/2021 18:51   IR Fluoro Guide CV Line Right  Result Date: 04/30/2021 INDICATION: Poorly functioning tunneled dialysis catheter EXAM: Tunneled dialysis catheter exchange MEDICATIONS: None ANESTHESIA/SEDATION: None FLUOROSCOPY TIME:  Fluoroscopy Time: 0 minutes 30 seconds (1 mGy). COMPLICATIONS: None immediate. PROCEDURE: Informed written consent was obtained from the patient's son after a thorough discussion of the procedural risks, benefits and alternatives. All questions were addressed. Maximal Sterile Barrier Technique was utilized including caps, mask, sterile gowns, sterile gloves, sterile drape, hand hygiene and skin antiseptic. A timeout was performed prior to the initiation of the procedure. Patient positioned supine on the procedure table. The external segment of the existing tunneled IJ catheter and surrounding skin prepped and draped usual fashion. Following local lidocaine administration, the existing catheter was removed over a 0.035 inch guidewire under fluoroscopy. New 23 cm tunneled dialysis catheter was inserted over the guidewire. Tip positioned in the right atrium. Both lumens aspirated and flushed well and were locked with heparin. Catheter secured to skin with suture and insertion site covered with Biopatch and  sterile dressing. IMPRESSION: Successful exchange of poorly functioning right IJ dialysis catheter. New 23 cm tunneled hemodialysis catheter in place and ready for use. Electronically Signed   By: Miachel Roux M.D.   On: 04/30/2021 15:59   DG Chest Port 1 View  Result Date: 05/17/2021 CLINICAL DATA:  Altered mental status. EXAM: PORTABLE CHEST 1 VIEW COMPARISON:  05/04/2021 FINDINGS: Right IJ dialysis catheter unchanged with tip over the right atrium. Patient is slightly rotated to the right. Lungs are adequately inflated and otherwise clear. Mild stable cardiomegaly. Remainder of the exam is unchanged. IMPRESSION: 1. No acute cardiopulmonary disease. 2. Mild stable cardiomegaly. Electronically Signed   By: Marin Olp M.D.   On: 05/17/2021 08:43   DG CHEST PORT 1 VIEW  Result Date: 05/04/2021 CLINICAL DATA:  Hypoxia EXAM: PORTABLE CHEST 1 VIEW COMPARISON:  Apr 28, 2021, Apr 17, 2021 FINDINGS: Evaluation is limited secondary to patient positioning. Unchanged enlarged cardiomediastinal silhouette when accounting for patient rotation. RIGHT IJ CVC tip terminates over the RIGHT atrium. No pleural effusion or pneumothorax. Surgical clips project over the neck. No acute pleuroparenchymal abnormality. Unchanged elevation of the RIGHT hemidiaphragm. Degenerative changes of the thoracic spine. IMPRESSION: No acute cardiopulmonary abnormality. Electronically Signed   By: Valentino Saxon MD   On: 05/04/2021 19:00   DG Chest Port 1 View  Result Date: 04/28/2021 CLINICAL DATA:  75 year old female with history of shortness of breath. EXAM: PORTABLE CHEST 1 VIEW COMPARISON:  Chest x-ray 04/17/2021. FINDINGS: Right internal jugular PermCath with tips terminating in the right atrium. Lung volumes are low. No consolidative airspace disease. No pleural effusions. No pneumothorax. No pulmonary nodule or mass noted. Pulmonary vasculature and the cardiomediastinal silhouette are within normal limits. Surgical clips  project over the right breast, likely from prior  lumpectomy. IMPRESSION: 1. Low lung volumes without radiographic evidence of acute cardiopulmonary disease. Electronically Signed   By: Vinnie Langton M.D.   On: 04/28/2021 16:11   EEG adult  Result Date: 05/13/2021 Lora Havens, MD     05/13/2021 11:58 AM Patient Name: DELLIA DONNELLY MRN: [id number] Epilepsy Attending: Lora Havens Referring Physician/Provider: Dr Donnetta Simpers Date: 05/13/2021 Duration: 26.07 mins Patient history: 75 year old female with altered mental status.  EEG to evaluate for seizures. Level of alertness: Awake AEDs during EEG study: Keppra Technical aspects: This EEG study was done with scalp electrodes positioned according to the 10-20 International system of electrode placement. Electrical activity was acquired at a sampling rate of 500Hz  and reviewed with a high frequency filter of 70Hz  and a low frequency filter of 1Hz . EEG data were recorded continuously and digitally stored. Description:  The posterior dominant rhythm consists of 8 Hz activity of moderate voltage (25-35 uV) seen predominantly in posterior head regions, symmetric and reactive to eye opening and eye closing.  EEG showed continuous generalized 3 to 5 Hz theta-delta slowing.  There is also sharply contoured 3 to 5 Hz theta-delta slowing in right frontal region consistent with underlying breech artifact.  Sharp transients were seen right parietal region. Hyperventilation and photic stimulation were not performed.   ABNORMALITY -Continuous slow, generalized - Breach artifact, right frontal region IMPRESSION: This study is suggestive of cortical dysfunction in right frontal region consistent with prior craniotomy.  Additionally there is evidence of moderate diffuse encephalopathy, nonspecific etiology.  No seizures or definite epileptiform discharges were seen throughout the recording. Lora Havens   ECHO TEE  Result Date: 05/15/2021    TRANSESOPHOGEAL  ECHO REPORT   Patient Name:   ANASTAZJA ISAAC Date of Exam: 05/15/2021 Medical Rec #:  [id number]         Height:       62.0 in Accession #:    [id number]        Weight:       124.6 lb Date of Birth:  03-16-1946          BSA:          1.563 m Patient Age:    65 years          BP:           159/64 mmHg Patient Gender: F                 HR:           93 bpm. Exam Location:  Inpatient Procedure: 2D Echo, Color Doppler and Saline Contrast Bubble Study Indications:     Bacteremia. CVA.  History:         Patient has prior history of Echocardiogram examinations, most                  recent 04/20/2021.  Sonographer:     Philipp Deputy Referring Phys:  [id number] HAO MENG Diagnosing Phys: Candee Furbish MD PROCEDURE: After discussion of the risks and benefits of a TEE, an informed consent was obtained from the patient. The transesophogeal probe was passed without difficulty through the esophogus of the patient. Imaged were obtained with the patient in a left lateral decubitus position. Local oropharyngeal anesthetic was provided with viscous lidocaine. Sedation performed by different physician. Image quality was adequate. The patient developed no complications during the procedure. IMPRESSIONS  1. +PFO, positive bubble study.  2. Left ventricular ejection fraction, by estimation, is 65 to  70%. The left ventricle has normal function.  3. Right ventricular systolic function is normal. The right ventricular size is normal.  4. No left atrial/left atrial appendage thrombus was detected.  5. The mitral valve is normal in structure. No evidence of mitral valve regurgitation. No evidence of mitral stenosis.  6. The aortic valve is tricuspid. Aortic valve regurgitation is mild. Mild aortic valve sclerosis is present, with no evidence of aortic valve stenosis.  7. Evidence of atrial level shunting detected by color flow Doppler. Agitated saline contrast bubble study was positive with shunting observed within 3-6 cardiac cycles suggestive  of interatrial shunt. There is a small patent foramen ovale with predominantly right to left shunting across the atrial septum. Conclusion(s)/Recommendation(s): No evidence of vegetation/infective endocarditis on this transesophageal echocardiogram. +PFO, positive bubble study.  FINDINGS  Left Ventricle: Left ventricular ejection fraction, by estimation, is 65 to 70%. The left ventricle has normal function. The left ventricular internal cavity size was small. Right Ventricle: The right ventricular size is normal. No increase in right ventricular wall thickness. Right ventricular systolic function is normal. Left Atrium: Left atrial size was normal in size. No left atrial/left atrial appendage thrombus was detected. Right Atrium: Right atrial size was normal in size. Pericardium: There is no evidence of pericardial effusion. Mitral Valve: The mitral valve is normal in structure. No evidence of mitral valve regurgitation. No evidence of mitral valve stenosis. Tricuspid Valve: The tricuspid valve is normal in structure. Tricuspid valve regurgitation is mild . No evidence of tricuspid stenosis. Aortic Valve: The aortic valve is tricuspid. Aortic valve regurgitation is mild. Mild aortic valve sclerosis is present, with no evidence of aortic valve stenosis. Pulmonic Valve: The pulmonic valve was normal in structure. Pulmonic valve regurgitation is not visualized. Aorta: The aortic root was not well visualized. Venous: The pulmonary veins were not well visualized. IAS/Shunts: Evidence of atrial level shunting detected by color flow Doppler. Agitated saline contrast was given intravenously to evaluate for intracardiac shunting. Agitated saline contrast bubble study was positive with shunting observed within 3-6 cardiac cycles suggestive of interatrial shunt. A small patent foramen ovale is detected with predominantly right to left shunting across the atrial septum. Additional Comments: +PFO, positive bubble study. Candee Furbish MD Electronically signed by Candee Furbish MD Signature Date/Time: 05/15/2021/12:24:43 PM    Final    VAS US CAROTID  Result Date: 05/15/2021 Carotid Arterial Duplex Study Patient Name:  [name]  Date of Exam:   05/14/2021 Medical Rec #: [id number]          Accession #:    [id number] Date of Birth: Jan 15, 1946           Patient Gender: F Patient Age:   74Y Exam Location:  [facility] Procedure:      VAS US CAROTID Referring Phys: [id number] [place] --------------------------------------------------------------------------------  Indications:       CVA. Comparison Study:  No prior study Performing Technologist: Maudry Mayhew MHA, RDMS, RVT, RDCS  Examination Guidelines: A complete evaluation includes B-mode imaging, spectral Doppler, color Doppler, and power Doppler as needed of all accessible portions of each vessel. Bilateral testing is considered an integral part of a complete examination. Limited examinations for reoccurring indications may be performed as noted.  Right Carotid Findings: +----------+--------+--------+--------+------------------+--------+           PSV cm/sEDV cm/sStenosisPlaque DescriptionComments +----------+--------+--------+--------+------------------+--------+ CCA Prox  51      17                                         +----------+--------+--------+--------+------------------+--------+  CCA Distal43      10                                         +----------+--------+--------+--------+------------------+--------+ ICA Prox  42      14                                         +----------+--------+--------+--------+------------------+--------+ ECA       36      8                                          +----------+--------+--------+--------+------------------+--------+ +---------+--------+--------+---------+ VertebralPSV cm/sEDV cm/sAntegrade +---------+--------+--------+---------+  Left Carotid Findings:  +----------+--------+--------+--------+-----------------------+--------+           PSV cm/sEDV cm/sStenosisPlaque Description     Comments +----------+--------+--------+--------+-----------------------+--------+ CCA Prox  41      11                                              +----------+--------+--------+--------+-----------------------+--------+ CCA Distal36      11                                              +----------+--------+--------+--------+-----------------------+--------+ ICA Prox  33      17                                              +----------+--------+--------+--------+-----------------------+--------+ ICA Distal44      12                                              +----------+--------+--------+--------+-----------------------+--------+ ECA       41      10              smooth and heterogenous         +----------+--------+--------+--------+-----------------------+--------+ +----------+--------+--------+----------------+-------------------+           PSV cm/sEDV cm/sDescribe        Arm Pressure (mmHG) +----------+--------+--------+----------------+-------------------+ [id number]              Multiphasic, WNL                    +----------+--------+--------+----------------+-------------------+ +---------+--------+--+--------+--+---------+ VertebralPSV cm/s52EDV cm/s18Antegrade +---------+--------+--+--------+--+---------+   Summary: Right Carotid: Velocities in the right ICA are consistent with a 1-39% stenosis. Left Carotid: Velocities in the left ICA are consistent with a 1-39% stenosis. Vertebrals:  Left vertebral artery demonstrates antegrade flow. Subclavians: Normal flow hemodynamics were seen in the left subclavian artery. *See table(s) above for measurements and observations.  Electronically signed by Antony Contras MD on 05/15/2021 at 11:57:13 AM.    Final    VAS [place] LOWER EXTREMITY VENOUS (DVT)  Result Date: 05/16/2021   Lower Venous  DVT Study Patient Name:  GWYNNE KEMNITZ  Date of Exam:   05/16/2021 Medical Rec #: [id number]          Accession #:    [id number] Date of Birth: 1946/09/10           Patient Gender: F Patient Age:   65Y Exam Location:  [facility] Procedure:      VAS [place] LOWER EXTREMITY VENOUS (DVT) Referring Phys: [id number] PRAMOD S SETHI --------------------------------------------------------------------------------  Indications: Stroke, and Positive PFO by TEE.  Limitations: Patient in reclining chair. Altered mental status. Comparison Study: Prior negative Left LEV done 04/10/21 and 12/26/16, are available                   for comparison Performing Technologist: Sharion Dove RVS  Examination Guidelines: A complete evaluation includes B-mode imaging, spectral Doppler, color Doppler, and power Doppler as needed of all accessible portions of each vessel. Bilateral testing is considered an integral part of a complete examination. Limited examinations for reoccurring indications may be performed as noted. The reflux portion of the exam is performed with the patient in reverse Trendelenburg.  +---------+---------------+---------+-----------+----------+-------------------+ RIGHT    CompressibilityPhasicitySpontaneityPropertiesThrombus Aging      +---------+---------------+---------+-----------+----------+-------------------+ CFV      Full           Yes      Yes                                      +---------+---------------+---------+-----------+----------+-------------------+ SFJ      Full                                                             +---------+---------------+---------+-----------+----------+-------------------+ FV Prox  Full                                                             +---------+---------------+---------+-----------+----------+-------------------+ FV Mid   Full                                                              +---------+---------------+---------+-----------+----------+-------------------+ FV DistalFull                                                             +---------+---------------+---------+-----------+----------+-------------------+ PFV                     Yes      Yes                                      +---------+---------------+---------+-----------+----------+-------------------+  POP      Full           Yes      Yes                                      +---------+---------------+---------+-----------+----------+-------------------+ PTV      Full                                                             +---------+---------------+---------+-----------+----------+-------------------+ PERO                                                  Not well visualized +---------+---------------+---------+-----------+----------+-------------------+   +---------+---------------+---------+-----------+----------+-------------------+ LEFT     CompressibilityPhasicitySpontaneityPropertiesThrombus Aging      +---------+---------------+---------+-----------+----------+-------------------+ CFV                     Yes      Yes                  patent by color and                                                       Doppler             +---------+---------------+---------+-----------+----------+-------------------+ FV Prox                 Yes      Yes                  patent by color and                                                       Doppler             +---------+---------------+---------+-----------+----------+-------------------+ FV Mid                  Yes      Yes                  patent by color and                                                       Doppler             +---------+---------------+---------+-----------+----------+-------------------+ FV Distal               Yes      Yes                  patent by color and  Doppler             +---------+---------------+---------+-----------+----------+-------------------+ PFV                                                   Not well visualized +---------+---------------+---------+-----------+----------+-------------------+ POP                     Yes      Yes                  patent by color and                                                       Doppler             +---------+---------------+---------+-----------+----------+-------------------+ PTV                                                   Not well visualized +---------+---------------+---------+-----------+----------+-------------------+ PERO                                                  Not well visualized +---------+---------------+---------+-----------+----------+-------------------+     Summary: RIGHT: - There is no evidence of deep vein thrombosis in the lower extremity. However, portions of this examination were limited- see technologist comments above.  LEFT: - There is no evidence of deep vein thrombosis in the lower extremity. However, portions of this examination were limited- see technologist comments above.  *See table(s) above for measurements and observations. Electronically signed by Jamelle Haring on 05/16/2021 at 7:17:31 PM.    Final     Subjective: No new complaints.  Discharge Exam: Vitals:   05/21/21 0338 05/21/21 0813  BP: 115/71   Pulse: 83   Resp: 15   Temp: 98 F (36.7 C) 98.3 F (36.8 C)  SpO2: 100%    Vitals:   05/20/21 1939 05/20/21 2349 05/21/21 0338 05/21/21 0813  BP: 130/69 (!) 132/91 115/71   Pulse: 98 93 83   Resp: 20 (!) 22 15   Temp: 98 F (36.7 C) 97.9 F (36.6 C) 98 F (36.7 C) 98.3 F (36.8 C)  TempSrc: Oral Oral Oral Oral  SpO2: 98% 100% 100%   Weight:      Height:        General: Pt is alert, awake, not in acute distress Cardiovascular: RRR, S1/S2 +, no rubs,  no gallops Respiratory: CTA bilaterally, no wheezing, no rhonchi Abdominal: Soft, NT, ND, bowel sounds + Extremities: no edema, no cyanosis    The results of significant diagnostics from this hospitalization (including imaging, microbiology, ancillary and laboratory) are listed below for reference.     Microbiology: Recent Results (from the past 240 hour(s))  Culture, blood (routine x 2)     Status: None (Preliminary result)   Collection Time: 05/17/21  9:48 AM   Specimen: BLOOD LEFT FOREARM  Result Value Ref Range Status  Specimen Description BLOOD LEFT FOREARM  Final   Special Requests   Final    BOTTLES DRAWN AEROBIC AND ANAEROBIC Blood Culture adequate volume   Culture   Final    NO GROWTH 4 DAYS Performed at [facility], [address] [place], [place] [address]    Report Status PENDING  Incomplete  Culture, blood (routine x 2)     Status: None (Preliminary result)   Collection Time: 05/17/21  9:53 AM   Specimen: BLOOD LEFT FOREARM  Result Value Ref Range Status   Specimen Description BLOOD LEFT FOREARM  Final   Special Requests   Final    BOTTLES DRAWN AEROBIC AND ANAEROBIC Blood Culture adequate volume   Culture   Final    NO GROWTH 4 DAYS Performed at [facility], [place] [address], [place], [place] [address]    Report Status PENDING  Incomplete  Resp Panel by RT-PCR (Flu A&B, Covid) Nasopharyngeal Swab     Status: None   Collection Time: 05/17/21 11:28 AM   Specimen: Nasopharyngeal Swab; Nasopharyngeal(NP) swabs in vial transport medium  Result Value Ref Range Status   SARS Coronavirus 2 by RT PCR NEGATIVE NEGATIVE Final    Comment: (NOTE) SARS-CoV-2 target nucleic acids are NOT DETECTED.  The SARS-CoV-2 RNA is generally detectable in upper respiratory specimens during the acute phase of infection. The lowest concentration of SARS-CoV-2 viral copies this assay can detect is 138 copies/mL. A negative result does not preclude SARS-Cov-2 infection  and should not be used as the sole basis for treatment or other patient management decisions. A negative result may occur with  improper specimen collection/handling, submission of specimen other than nasopharyngeal swab, presence of viral mutation(s) within the areas targeted by this assay, and inadequate number of viral copies(<138 copies/mL). A negative result must be combined with clinical observations, patient history, and epidemiological information. The expected result is Negative.  Fact Sheet for Patients:  EntrepreneurPulse.com.au  Fact Sheet for Healthcare Providers:  IncredibleEmployment.be  This test is no t yet approved or cleared by the [place] FDA and  has been authorized for detection and/or diagnosis of SARS-CoV-2 by FDA under an Emergency Use Authorization (EUA). This EUA will remain  in effect (meaning this test can be used) for the duration of the COVID-19 declaration under Section 564(b)(1) of the Act, 21 U.S.C.section 360bbb-3(b)(1), unless the authorization is terminated  or revoked sooner.       Influenza A by PCR NEGATIVE NEGATIVE Final   Influenza B by PCR NEGATIVE NEGATIVE Final    Comment: (NOTE) The Xpert Xpress SARS-CoV-2/FLU/RSV plus assay is intended as an aid in the diagnosis of influenza from Nasopharyngeal swab specimens and should not be used as a sole basis for treatment. Nasal washings and aspirates are unacceptable for Xpert Xpress SARS-CoV-2/FLU/RSV testing.  Fact Sheet for Patients: EntrepreneurPulse.com.au  Fact Sheet for Healthcare Providers: IncredibleEmployment.be  This test is not yet approved or cleared by the [place] FDA and has been authorized for detection and/or diagnosis of SARS-CoV-2 by FDA under an Emergency Use Authorization (EUA). This EUA will remain in effect (meaning this test can be used) for the duration of the COVID-19 declaration  under Section 564(b)(1) of the Act, 21 U.S.C. section 360bbb-3(b)(1), unless the authorization is terminated or revoked.  Performed at [facility], [place] [address] [place], [place] [address]      Labs: BNP (last 3 results) Recent Labs    04/09/21 1545  BNP  213.0*   Basic Metabolic Panel: Recent Labs  Lab 05/18/21 0112 05/18/21 1232 05/19/21 1003 05/20/21 0158 05/21/21 0043  NA 141 143 135 137 135  K 4.1 4.3 4.7 3.4*  3.4* 3.5  CL 102 104 100 102 100  CO2 25 25 20* 23 26  GLUCOSE 139* 129* 98 104* 95  BUN 37* 39* 26* 38* 13  CREATININE 5.24* 5.60* 3.61* 4.60* 2.63*  CALCIUM 9.0 8.8* 7.8* 7.7* 7.5*  MG  --   --   --  1.8  --   PHOS 4.7* 4.9* 2.8 3.3 2.0*   Liver Function Tests: Recent Labs  Lab 05/17/21 0808 05/18/21 0112 05/18/21 1232 05/19/21 1003 05/20/21 0158 05/21/21 0043  AST 28  --   --   --   --   --   ALT 16  --   --   --   --   --   ALKPHOS 87  --   --   --   --   --   BILITOT 1.8*  --   --   --   --   --   PROT 7.9  --   --   --   --   --   ALBUMIN 3.5 3.0* 2.9* 2.9* 2.4* 2.2*   Recent Labs  Lab 05/17/21 0808  LIPASE 26   Recent Labs  Lab 05/17/21 0808  AMMONIA 37*   CBC: Recent Labs  Lab 05/16/21 0714 05/17/21 0808 05/17/21 1146 05/18/21 0112 05/18/21 1232  WBC 8.1 23.6*  --  16.4* 16.4*  NEUTROABS  --  22.0*  --   --   --   HGB 7.1* 10.2* 9.9* 9.0* 9.0*  HCT 23.8* 32.3* 29.0* 28.2* 28.6*  MCV 95.2 92.6  --  92.2 94.1  PLT 291 202  --  220 255   Cardiac Enzymes: No results for input(s): CKTOTAL, CKMB, CKMBINDEX, TROPONINI in the last 168 hours. BNP: Invalid input(s): POCBNP CBG: Recent Labs  Lab 05/21/21 0021 05/21/21 0340 05/21/21 0804 05/21/21 0810 05/21/21 1214  GLUCAP 72 76 37* 217* 135*   D-Dimer No results for input(s): DDIMER in the last 72 hours. Hgb A1c No results for input(s): HGBA1C in the last 72 hours. Lipid Profile No results for input(s): CHOL, HDL, LDLCALC, TRIG, CHOLHDL, LDLDIRECT in the  last 72 hours. Thyroid function studies No results for input(s): TSH, T4TOTAL, T3FREE, THYROIDAB in the last 72 hours.  Invalid input(s): FREET3 Anemia work up No results for input(s): VITAMINB12, FOLATE, FERRITIN, TIBC, IRON, RETICCTPCT in the last 72 hours. Urinalysis    Component Value Date/Time   COLORURINE YELLOW 05/17/2021 0808   APPEARANCEUR CLEAR 05/17/2021 0808   LABSPEC 1.011 05/17/2021 0808   PHURINE 9.0 (H) 05/17/2021 0808   GLUCOSEU 50 (A) 05/17/2021 0808   HGBUR NEGATIVE 05/17/2021 0808   BILIRUBINUR NEGATIVE 05/17/2021 0808   KETONESUR 5 (A) 05/17/2021 0808   PROTEINUR >=300 (A) 05/17/2021 0808   NITRITE NEGATIVE 05/17/2021 0808   LEUKOCYTESUR NEGATIVE 05/17/2021 0808   Sepsis Labs Invalid input(s): PROCALCITONIN,  WBC,  LACTICIDVEN Microbiology Recent Results (from the past 240 hour(s))  Culture, blood (routine x 2)     Status: None (Preliminary result)   Collection Time: 05/17/21  9:48 AM   Specimen: BLOOD LEFT FOREARM  Result Value Ref Range Status   Specimen Description BLOOD LEFT FOREARM  Final   Special Requests   Final    BOTTLES DRAWN AEROBIC AND ANAEROBIC Blood Culture adequate volume   Culture  Final    NO GROWTH 4 DAYS Performed at [facility], [place] [address], [place], [place] [address]    Report Status PENDING  Incomplete  Culture, blood (routine x 2)     Status: None (Preliminary result)   Collection Time: 05/17/21  9:53 AM   Specimen: BLOOD LEFT FOREARM  Result Value Ref Range Status   Specimen Description BLOOD LEFT FOREARM  Final   Special Requests   Final    BOTTLES DRAWN AEROBIC AND ANAEROBIC Blood Culture adequate volume   Culture   Final    NO GROWTH 4 DAYS Performed at [facility], [place] [address], [place], [place] [address]    Report Status PENDING  Incomplete  Resp Panel by RT-PCR (Flu A&B, Covid) Nasopharyngeal Swab     Status: None   Collection Time: 05/17/21 11:28 AM   Specimen: Nasopharyngeal Swab;  Nasopharyngeal(NP) swabs in vial transport medium  Result Value Ref Range Status   SARS Coronavirus 2 by RT PCR NEGATIVE NEGATIVE Final    Comment: (NOTE) SARS-CoV-2 target nucleic acids are NOT DETECTED.  The SARS-CoV-2 RNA is generally detectable in upper respiratory specimens during the acute phase of infection. The lowest concentration of SARS-CoV-2 viral copies this assay can detect is 138 copies/mL. A negative result does not preclude SARS-Cov-2 infection and should not be used as the sole basis for treatment or other patient management decisions. A negative result may occur with  improper specimen collection/handling, submission of specimen other than nasopharyngeal swab, presence of viral mutation(s) within the areas targeted by this assay, and inadequate number of viral copies(<138 copies/mL). A negative result must be combined with clinical observations, patient history, and epidemiological information. The expected result is Negative.  Fact Sheet for Patients:  EntrepreneurPulse.com.au  Fact Sheet for Healthcare Providers:  IncredibleEmployment.be  This test is no t yet approved or cleared by the [place] FDA and  has been authorized for detection and/or diagnosis of SARS-CoV-2 by FDA under an Emergency Use Authorization (EUA). This EUA will remain  in effect (meaning this test can be used) for the duration of the COVID-19 declaration under Section 564(b)(1) of the Act, 21 U.S.C.section 360bbb-3(b)(1), unless the authorization is terminated  or revoked sooner.       Influenza A by PCR NEGATIVE NEGATIVE Final   Influenza B by PCR NEGATIVE NEGATIVE Final    Comment: (NOTE) The Xpert Xpress SARS-CoV-2/FLU/RSV plus assay is intended as an aid in the diagnosis of influenza from Nasopharyngeal swab specimens and should not be used as a sole basis for treatment. Nasal washings and aspirates are unacceptable for Xpert Xpress  SARS-CoV-2/FLU/RSV testing.  Fact Sheet for Patients: EntrepreneurPulse.com.au  Fact Sheet for Healthcare Providers: IncredibleEmployment.be  This test is not yet approved or cleared by the [place] FDA and has been authorized for detection and/or diagnosis of SARS-CoV-2 by FDA under an Emergency Use Authorization (EUA). This EUA will remain in effect (meaning this test can be used) for the duration of the COVID-19 declaration under Section 564(b)(1) of the Act, 21 U.S.C. section 360bbb-3(b)(1), unless the authorization is terminated or revoked.  Performed at [facility], [place] [address], [place], [place] [address]      Time coordinating discharge: Over 30 minutes  SIGNED:   Charlynne Cousins, MD  [facility] 05/21/2021, 12:41 PM Pager   If 7PM-7AM, please contact night-coverage [email or web address] Password [email or web address]

## 2021-05-21 NOTE — Progress Notes (Addendum)
[facility] PROGRESS NOTE    Progress Note  Kirsten Mcmahon  AOZ:[id number] DOB: 22-Jan-1946 DOA: 05/17/2021 PCP: [place]     Brief Narrative:   Kirsten Mcmahon is an 75 y.o. 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  Patient in the ED he was afebrile with respiration of 25 satting greater 92% on room air White count 23,000 hemoglobin 10, chest x-ray showed no acute findings, CT of the head stable compared to previous.  MRI of the brain was significantly related to motion, showed a few untreated acute infarctions in the right temporal and parietal lobe and bilateral thalamus.   Awaiting skilled nursing facility placement. Assessment/Plan:  Acute metabolic encephalopathy probably due to aspiration pneumonia Speech recommended SLP to rule aspiration. 2-D echo 04/21/2019 showed an EF of 55% with grade 1 diastolic heart failure. No events Telemetry monitoring. On aspirin and plavix. D/c plavix   Acute nausea and vomiting: Chest x-ray showed no acute findings. CT scan of the abdomen no acute process, but it did showed right greater than left patchy airspace disease. Fecal impactation. Started miralax.  SIRS question due to aspiration PNA: White blood cell count is trending down, T-max 98.3 she is currently on on zosyn.   She completed a 5-day course of IV Zosyn. We will go ahead and transition her back to her IV vancomycin.  ESRD: Last HD 6.11. Renal notified.  History of Streptococcus bacteremia: Continue her on IV Vanco per  pharmacy. She will need to continue IV Vanco until 06/01/2021. Family declined on last admission TEE.  Anemia of chronic disease: Hbg stable compared to previous.  Essential hypertension: Cont metoprolol, relative well controlled.  Seizure disorder: Cont keppra.  Non-insulin diabetes mellitus: Cont SSI.  Hypothyroidism: Cont synthroid.  Elevated ammonia level: Unlikely to be causing patient symptoms.  Stage II pressure ulcer present on admission: RN Pressure Injury Documentation: Pressure Injury 04/29/21 Buttocks Mid Stage 2 -  Partial thickness loss of dermis presenting as a shallow open injury with a red, pink wound bed without slough. (Active)  04/29/21 0045  Location: Buttocks  Location Orientation: Mid  Staging: Stage 2 -  Partial thickness loss of dermis presenting as a shallow open injury with a red, pink wound bed without slough.  Wound Description (Comments):   Present on Admission: Yes     DVT prophylaxis: lovenox Family Communication:Son Status is: Inpatient  Remains inpatient appropriate because:Hemodynamically unstable  Dispo: The patient is from: Home              Anticipated d/c is to: SNF              Patient currently is not medically stable to d/c.   Difficult to place patient No        Code Status:     Code Status Orders  (From admission, onward)           Start     Ordered   05/17/21 1231  Full code  Continuous        05/17/21 1231  Code Status History     Date Active Date Inactive Code Status Order ID Comments User Context   04/28/2021 1839 05/17/2021 0018 Full Code [id number]  Little Ishikawa, MD ED   04/09/2021 2102 04/24/2021 2217 Full Code [id number]  Elwyn Reach, MD Inpatient   01/17/2021 1921 01/21/2021 2109 Full Code [id number]  Ivor Costa, MD ED   07/15/2017 1640 07/16/2017 2032 Full Code [id number]  Armandina Gemma, MD Inpatient         IV Access:   Peripheral IV   Procedures and diagnostic  studies:   No results found.   Medical Consultants:   None.   Subjective:    Kirsten Mcmahon no complaints  Objective:    Vitals:   05/20/21 1939 05/20/21 2349 05/21/21 0338 05/21/21 0813  BP: 130/69 (!) 132/91 115/71   Pulse: 98 93 83   Resp: 20 (!) 22 15   Temp: 98 F (36.7 C) 97.9 F (36.6 C) 98 F (36.7 C) 98.3 F (36.8 C)  TempSrc: Oral Oral Oral Oral  SpO2: 98% 100% 100%   Weight:      Height:       SpO2: 100 %   Intake/Output Summary (Last 24 hours) at 05/21/2021 1255 Last data filed at 05/20/2021 1700 Gross per 24 hour  Intake 2120.12 ml  Output 0 ml  Net 2120.12 ml    Filed Weights   05/18/21 1730 05/20/21 0945 05/20/21 1510  Weight: 50.5 kg 51.5 kg 51.5 kg    Exam: General exam: In no acute distress. Respiratory system: Good air movement and clear to auscultation. Cardiovascular system: S1 & S2 heard, RRR. No JVD. Gastrointestinal system: Abdomen is nondistended, soft and nontender.  Extremities: No pedal edema. Skin: No rashes, lesions or ulcers  Data Reviewed:    Labs: Basic Metabolic Panel: Recent Labs  Lab 05/18/21 0112 05/18/21 1232 05/19/21 1003 05/20/21 0158 05/21/21 0043  NA 141 143 135 137 135  K 4.1 4.3 4.7 3.4*  3.4* 3.5  CL 102 104 100 102 100  CO2 25 25 20* 23 26  GLUCOSE 139* 129* 98 104* 95  BUN 37* 39* 26* 38* 13  CREATININE 5.24* 5.60* 3.61* 4.60* 2.63*  CALCIUM 9.0 8.8* 7.8* 7.7* 7.5*  MG  --   --   --  1.8  --   PHOS 4.7* 4.9* 2.8 3.3 2.0*    GFR Estimated Creatinine Clearance: 14.4 mL/min (A) (by C-G formula based on SCr of 2.63 mg/dL (H)). Liver Function Tests: Recent Labs  Lab 05/17/21 0808 05/18/21 0112 05/18/21 1232 05/19/21 1003 05/20/21 0158 05/21/21 0043  AST 28  --   --   --   --   --   ALT 16  --   --   --   --   --   ALKPHOS 87  --   --   --   --   --   BILITOT 1.8*  --   --   --   --   --   PROT 7.9  --   --   --   --   --   ALBUMIN 3.5 3.0* 2.9* 2.9* 2.4* 2.2*    Recent Labs   Lab 05/17/21 0808  LIPASE 26    Recent Labs  Lab 05/17/21 0808  AMMONIA 37*    Coagulation profile No results for input(s): INR, PROTIME in the last 168 hours. COVID-19 Labs  No results for input(s): DDIMER, FERRITIN, LDH, CRP in the last 72  hours.  Lab Results  Component Value Date   SARSCOV2NAA NEGATIVE 05/17/2021   [place] NEGATIVE 04/28/2021   [place] NEGATIVE 04/09/2021   [place] NEGATIVE 01/17/2021    CBC: Recent Labs  Lab 05/16/21 0714 05/17/21 0808 05/17/21 1146 05/18/21 0112 05/18/21 1232  WBC 8.1 23.6*  --  16.4* 16.4*  NEUTROABS  --  22.0*  --   --   --   HGB 7.1* 10.2* 9.9* 9.0* 9.0*  HCT 23.8* 32.3* 29.0* 28.2* 28.6*  MCV 95.2 92.6  --  92.2 94.1  PLT 291 202  --  220 255    Cardiac Enzymes: No results for input(s): CKTOTAL, CKMB, CKMBINDEX, TROPONINI in the last 168 hours. BNP (last 3 results) No results for input(s): PROBNP in the last 8760 hours. CBG: Recent Labs  Lab 05/21/21 0021 05/21/21 0340 05/21/21 0804 05/21/21 0810 05/21/21 1214  GLUCAP 72 76 37* 217* 135*    D-Dimer: No results for input(s): DDIMER in the last 72 hours. Hgb A1c: No results for input(s): HGBA1C in the last 72 hours. Lipid Profile: No results for input(s): CHOL, HDL, LDLCALC, TRIG, CHOLHDL, LDLDIRECT in the last 72 hours. Thyroid function studies: No results for input(s): TSH, T4TOTAL, T3FREE, THYROIDAB in the last 72 hours.  Invalid input(s): FREET3 Anemia work up: No results for input(s): VITAMINB12, FOLATE, FERRITIN, TIBC, IRON, RETICCTPCT in the last 72 hours. Sepsis Labs: Recent Labs  Lab 05/16/21 0714 05/17/21 0808 05/17/21 0936 05/17/21 1138 05/18/21 0112 05/18/21 1232  WBC 8.1 23.6*  --   --  16.4* 16.4*  LATICACIDVEN  --   --  1.8 1.4  --   --     Microbiology Recent Results (from the past 240 hour(s))  Culture, blood (routine x 2)     Status: None (Preliminary result)   Collection Time: 05/17/21  9:48 AM   Specimen:  BLOOD LEFT FOREARM  Result Value Ref Range Status   Specimen Description BLOOD LEFT FOREARM  Final   Special Requests   Final    BOTTLES DRAWN AEROBIC AND ANAEROBIC Blood Culture adequate volume   Culture   Final    NO GROWTH 4 DAYS Performed at [facility], [place] [address] [place], [place] [address]    Report Status PENDING  Incomplete  Culture, blood (routine x 2)     Status: None (Preliminary result)   Collection Time: 05/17/21  9:53 AM   Specimen: BLOOD LEFT FOREARM  Result Value Ref Range Status   Specimen Description BLOOD LEFT FOREARM  Final   Special Requests   Final    BOTTLES DRAWN AEROBIC AND ANAEROBIC Blood Culture adequate volume   Culture   Final    NO GROWTH 4 DAYS Performed at [facility], [place] [address], [place],  [address]    Report Status PENDING  Incomplete  Resp Panel by RT-PCR (Flu A&B, Covid) Nasopharyngeal Swab     Status: None   Collection Time: 05/17/21 11:28 AM   Specimen: Nasopharyngeal Swab; Nasopharyngeal(NP) swabs in vial transport medium  Result Value Ref Range Status   SARS Coronavirus 2 by RT PCR NEGATIVE NEGATIVE Final    Comment: (NOTE) SARS-CoV-2 target nucleic acids are NOT DETECTED.  The SARS-CoV-2 RNA is generally detectable in upper respiratory specimens during the acute phase of infection. The lowest concentration of SARS-CoV-2 viral copies this assay can detect is 138 copies/mL. A negative result does not preclude SARS-Cov-2 infection and should not be used as the sole basis for treatment  or other patient management decisions. A negative result may occur with  improper specimen collection/handling, submission of specimen other than nasopharyngeal swab, presence of viral mutation(s) within the areas targeted by this assay, and inadequate number of viral copies(<138 copies/mL). A negative result must be combined with clinical observations, patient history, and epidemiological information. The expected result is  Negative.  Fact Sheet for Patients:  EntrepreneurPulse.com.au  Fact Sheet for Healthcare Providers:  IncredibleEmployment.be  This test is no t yet approved or cleared by the [place] FDA and  has been authorized for detection and/or diagnosis of SARS-CoV-2 by FDA under an Emergency Use Authorization (EUA). This EUA will remain  in effect (meaning this test can be used) for the duration of the COVID-19 declaration under Section 564(b)(1) of the Act, 21 U.S.C.section 360bbb-3(b)(1), unless the authorization is terminated  or revoked sooner.       Influenza A by PCR NEGATIVE NEGATIVE Final   Influenza B by PCR NEGATIVE NEGATIVE Final    Comment: (NOTE) The Xpert Xpress SARS-CoV-2/FLU/RSV plus assay is intended as an aid in the diagnosis of influenza from Nasopharyngeal swab specimens and should not be used as a sole basis for treatment. Nasal washings and aspirates are unacceptable for Xpert Xpress SARS-CoV-2/FLU/RSV testing.  Fact Sheet for Patients: EntrepreneurPulse.com.au  Fact Sheet for Healthcare Providers: IncredibleEmployment.be  This test is not yet approved or cleared by the [place] FDA and has been authorized for detection and/or diagnosis of SARS-CoV-2 by FDA under an Emergency Use Authorization (EUA). This EUA will remain in effect (meaning this test can be used) for the duration of the COVID-19 declaration under Section 564(b)(1) of the Act, 21 U.S.C. section 360bbb-3(b)(1), unless the authorization is terminated or revoked.  Performed at [facility], [place] [address], [place], [place] [address]      Medications:    (feeding supplement) PROSource Plus  30 mL Oral TID BM   aspirin EC  81 mg Oral Daily   atorvastatin  40 mg Oral Daily   Chlorhexidine Gluconate Cloth  6 each Topical Q0600   clopidogrel  75 mg Oral Daily   darbepoetin (ARANESP) injection - DIALYSIS  100  mcg Intravenous Q Mon-HD   feeding supplement (NEPRO CARB STEADY)  237 mL Oral BID BM   heparin  5,000 Units Subcutaneous Q8H   insulin aspart  0-6 Units Subcutaneous Q4H   latanoprost  1 drop Both Eyes QHS   letrozole  2.5 mg Oral Daily   levothyroxine  112 mcg Oral Q0600   metoprolol succinate  50 mg Oral Daily   midodrine  5 mg Oral Q M,W,F-HD   multivitamin  1 tablet Oral QHS   sodium chloride flush  3 mL Intravenous Q12H   Continuous Infusions:  dextrose 40 mL/hr at 05/21/21 1123   levETIRAcetam 500 mg (05/20/21 2311)   piperacillin-tazobactam (ZOSYN)  IV 2.25 g (05/21/21 0824)      LOS: 4 days   Charlynne Cousins  [facility]  05/21/2021, 12:55 PM

## 2021-05-21 NOTE — Progress Notes (Signed)
Patient had burgundy blood clots and mucus in stool. Notified MD, on the way to bedside. New orders received.

## 2021-05-21 NOTE — Progress Notes (Signed)
Pharmacy Antibiotic Note  Kirsten Mcmahon is a 75 y.o. female on day #5 Zosyn for SIRS/intra-abdominal coverage. Pharmacy has been consulted for Vancomycin dosing.      On Vancomycin since 04/23/21 for Group B Strep bacteremia.  6-week course planned, thru 06/01/21. Vanc 500 mg IV MWF after hemodialysis held while on Zosyn.  Now to resume Vanc.  Last Vanc dose 05/16/21 after HD, done on Saturday last week due to malfunctioning catheter. Discharged on 6/11 then re-admitted 6/12. Has had dialysis 6/13 and 6/15.  Plan:  Vancomycin 1gm IV x 1 today.  Resume Vancomycin 500 mg IV MWF after HD on 6/17.  Stop date 06/01/21.  Height: 5\' 2"  (157.5 cm) Weight: 51.5 kg (113 lb 8.6 oz) IBW/kg (Calculated) : 50.1  Temp (24hrs), Avg:98.1 F (36.7 C), Min:97.5 F (36.4 C), Max:98.4 F (36.9 C)  Recent Labs  Lab 05/15/21 0130 05/15/21 1117 05/16/21 0714 05/17/21 0808 05/17/21 0936 05/17/21 1138 05/18/21 0112 05/18/21 1232 05/19/21 1003 05/20/21 0158 05/21/21 0043 05/21/21 1408  WBC  --   --  8.1 23.6*  --   --  16.4* 16.4*  --   --   --  7.9  CREATININE  --    < > 2.47* 4.70*  --   --  5.24* 5.60* 3.61* 4.60* 2.63*  --   LATICACIDVEN  --   --   --   --  1.8 1.4  --   --   --   --   --   --   VANCORANDOM 21  --   --   --   --   --   --   --   --   --   --   --    < > = values in this interval not displayed.    ESRD  Allergies  Allergen Reactions   Contrast Media [Iodinated Diagnostic Agents]     Other reaction(s): NO ALLERGY   Latex     Other reaction(s): NO ALLERGY   Shellfish-Derived Products     Other reaction(s): NO ALLERGY   Levemir [Insulin Detemir] Itching    Antimicrobials this admission: Zosyn 6/12>>6/16 Vanc resuming 6/17 >> (6/27)  Previous antibiotics: Ceftriaxone 5/14 >> 5/16 Ampicillin 5/17 >>5/19 Vanc 5/19>>(6/27)  6/10 pre-HD vanc level 21 mcg/ml, on track (pre-HD goal 15-25)  Microbiology results: 6/12 Blood: ng x 4 days to date 6/12 COVID and flu:  neg  5/13 blood: Strep Group G, sens Amp, CTX, LVQ, PCN, Vanc (MIC 0.5)  Thank you for allowing pharmacy to be a part of this patient's care.  Arty Baumgartner, [place] 05/21/2021 2:45 PM

## 2021-05-22 ENCOUNTER — Inpatient Hospital Stay (HOSPITAL_COMMUNITY): Payer: Medicare (Managed Care)

## 2021-05-22 DIAGNOSIS — Z515 Encounter for palliative care: Secondary | ICD-10-CM | POA: Diagnosis not present

## 2021-05-22 DIAGNOSIS — N186 End stage renal disease: Secondary | ICD-10-CM | POA: Diagnosis not present

## 2021-05-22 DIAGNOSIS — Z7189 Other specified counseling: Secondary | ICD-10-CM

## 2021-05-22 DIAGNOSIS — I10 Essential (primary) hypertension: Secondary | ICD-10-CM | POA: Diagnosis not present

## 2021-05-22 DIAGNOSIS — R7881 Bacteremia: Secondary | ICD-10-CM | POA: Diagnosis not present

## 2021-05-22 DIAGNOSIS — G9341 Metabolic encephalopathy: Secondary | ICD-10-CM | POA: Diagnosis not present

## 2021-05-22 LAB — GLUCOSE, CAPILLARY
Glucose-Capillary: 115 mg/dL — ABNORMAL HIGH (ref 70–99)
Glucose-Capillary: 125 mg/dL — ABNORMAL HIGH (ref 70–99)
Glucose-Capillary: 135 mg/dL — ABNORMAL HIGH (ref 70–99)
Glucose-Capillary: 149 mg/dL — ABNORMAL HIGH (ref 70–99)
Glucose-Capillary: 150 mg/dL — ABNORMAL HIGH (ref 70–99)
Glucose-Capillary: 152 mg/dL — ABNORMAL HIGH (ref 70–99)

## 2021-05-22 LAB — RENAL FUNCTION PANEL
Albumin: 2.2 g/dL — ABNORMAL LOW (ref 3.5–5.0)
Anion gap: 12 (ref 5–15)
BUN: 21 mg/dL (ref 8–23)
CO2: 23 mmol/L (ref 22–32)
Calcium: 7.9 mg/dL — ABNORMAL LOW (ref 8.9–10.3)
Chloride: 100 mmol/L (ref 98–111)
Creatinine, Ser: 3.83 mg/dL — ABNORMAL HIGH (ref 0.44–1.00)
GFR, Estimated: 12 mL/min — ABNORMAL LOW (ref >60)
Glucose, Bld: 151 mg/dL — ABNORMAL HIGH (ref 70–99)
Phosphorus: 3.2 mg/dL (ref 2.5–4.6)
Potassium: 3.9 mmol/L (ref 3.5–5.1)
Sodium: 135 mmol/L (ref 135–145)

## 2021-05-22 LAB — CBC
HCT: 24.3 % — ABNORMAL LOW (ref 36.0–46.0)
Hemoglobin: 8 g/dL — ABNORMAL LOW (ref 12.0–15.0)
MCH: 30 pg (ref 26.0–34.0)
MCHC: 32.9 g/dL (ref 30.0–36.0)
MCV: 91 fL (ref 80.0–100.0)
Platelets: 205 10*3/uL (ref 150–400)
RBC: 2.67 MIL/uL — ABNORMAL LOW (ref 3.87–5.11)
RDW: 14.6 % (ref 11.5–15.5)
WBC: 8.8 10*3/uL (ref 4.0–10.5)
nRBC: 0 % (ref 0.0–0.2)

## 2021-05-22 LAB — CULTURE, BLOOD (ROUTINE X 2)
Culture: NO GROWTH
Culture: NO GROWTH
Special Requests: ADEQUATE
Special Requests: ADEQUATE

## 2021-05-22 MED ORDER — HEPARIN SODIUM (PORCINE) 1000 UNIT/ML IJ SOLN
INTRAMUSCULAR | Status: AC
  Filled 2021-05-22: qty 1

## 2021-05-22 MED ORDER — LIDOCAINE-PRILOCAINE 2.5-2.5 % EX CREA: 1.0000 "application " | TOPICAL_CREAM | CUTANEOUS | Status: DC | PRN

## 2021-05-22 MED ORDER — VANCOMYCIN HCL IN DEXTROSE 500-5 MG/100ML-% IV SOLN
INTRAVENOUS | Status: AC
  Administered 2021-05-22: 500 mg via ARTERIOVENOUS_FISTULA
  Filled 2021-05-22: qty 100

## 2021-05-22 MED ORDER — LIDOCAINE HCL (PF) 1 % IJ SOLN: 5.0000 mL | INTRAMUSCULAR | Status: DC | PRN

## 2021-05-22 MED ORDER — PENTAFLUOROPROP-TETRAFLUOROETH EX AERO: 1.0000 "application " | INHALATION_SPRAY | CUTANEOUS | Status: DC | PRN

## 2021-05-22 NOTE — Progress Notes (Signed)
[facility] PROGRESS NOTE    Progress Note  Kirsten Mcmahon  GOT:[id number] DOB: 11-06-1946 DOA: 05/17/2021 PCP: [place]     Brief Narrative:   Kirsten Mcmahon is an 75 y.o. 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  Patient in the ED he was afebrile with respiration of 25 satting greater 92% on room air White count 23,000 hemoglobin 10, chest x-ray showed no acute findings, CT of the head stable compared to previous.  MRI of the brain was significantly related to motion, showed a few untreated acute infarctions in the right temporal and parietal lobe and bilateral thalamus.   Awaiting skilled nursing facility placement. Assessment/Plan:  Acute metabolic encephalopathy probably due to aspiration pneumonia Speech recommended admit BMP VS which eventually led to changing her diet to a dysphagia 3. 2-D echo 04/21/2019 showed an EF of 55% with grade 1 diastolic heart failure. No events Telemetry monitoring. Cont aspirin.  Acute nausea and vomiting: Chest x-ray showed no acute findings. CT scan of the abdomen no acute process, but it did showed right greater than left patchy airspace disease. Fecal impactation.  She was given an enema and started on MiraLAX p.o. twice daily. Started miralax.  Sepsis question due to aspiration PNA: White blood cell count is trending down, T-max 98.3 she is currently on on zosyn.   She completed a 5-day course of IV Zosyn. We will go ahead and transition her back to her IV vancomycin.  ESRD: Last HD  6.11. Renal notified.  History of Streptococcus bacteremia: Continue her on IV Vanco per pharmacy. She will need to continue IV Vanco until 06/01/2021. Family declined on last admission TEE.  Anemia of chronic disease: Hbg stable compared to previous.  Essential hypertension: Cont metoprolol, relative well controlled.  Seizure disorder: Cont keppra.  Non-insulin diabetes mellitus: Cont SSI.  Hypothyroidism: Cont synthroid.  Elevated ammonia level: Unlikely to be causing patient symptoms.  1 episode of bloody bowel movement: She was continued on aspirin, she had 1 episode of bloody bowel movement her hemoglobin remained stable question hemorrhoidal bleed. Resume heparin.  Continue to check CBCs regularly.  Stage II pressure ulcer present on admission: RN Pressure Injury Documentation: Pressure Injury 04/29/21 Buttocks Mid Stage 2 -  Partial thickness loss of dermis presenting as a shallow open injury with a red, pink wound bed without slough. (Active)  04/29/21 0045  Location: Buttocks  Location Orientation: Mid  Staging: Stage 2 -  Partial thickness loss of dermis presenting as a shallow open injury with a red, pink wound bed without slough.  Wound Description (Comments):   Present on Admission: Yes     DVT prophylaxis: lovenox Family Communication:Son Status is: Inpatient  Remains inpatient appropriate because:Hemodynamically unstable  Dispo: The patient is from: Home              Anticipated d/c is to: SNF              Patient currently is not medically stable to d/c.   Difficult to place patient No        Code Status:  Code Status Orders  (From admission, onward)           Start     Ordered   05/17/21 1231  Full code  Continuous        05/17/21 1231           Code Status History     Date Active Date Inactive Code Status Order ID Comments User Context   04/28/2021 1839 05/17/2021 0018 Full Code [id number]  Little Ishikawa, MD ED    04/09/2021 2102 04/24/2021 2217 Full Code [id number]  Elwyn Reach, MD Inpatient   01/17/2021 1921 01/21/2021 2109 Full Code [id number]  Ivor Costa, MD ED   07/15/2017 1640 07/16/2017 2032 Full Code [id number]  Armandina Gemma, MD Inpatient         IV Access:   Peripheral IV   Procedures and diagnostic studies:   No results found.   Medical Consultants:   None.   Subjective:    Kirsten Mcmahon no complaints tolerated her diet this morning.  Objective:    Vitals:   05/22/21 0957 05/22/21 1026 05/22/21 1058 05/22/21 1100  BP: 122/68 137/78 120/80 120/80  Pulse: (!) 102 (!) 107 (!) 103 (!) 103  Resp:      Temp:      TempSrc:      SpO2:      Weight:      Height:       SpO2: 99 %   Intake/Output Summary (Last 24 hours) at 05/22/2021 1113 Last data filed at 05/21/2021 2200 Gross per 24 hour  Intake 530.55 ml  Output --  Net 530.55 ml    Filed Weights   05/20/21 1510 05/22/21 0442 05/22/21 0750  Weight: 51.5 kg 52 kg 57 kg    Exam: General exam: In no acute distress. Respiratory system: Good air movement and clear to auscultation. Cardiovascular system: S1 & S2 heard, RRR. No JVD. Gastrointestinal system: Abdomen is nondistended, soft and nontender.  Extremities: No pedal edema. Skin: No rashes, lesions or ulcers  Data Reviewed:    Labs: Basic Metabolic Panel: Recent Labs  Lab 05/18/21 1232 05/19/21 1003 05/20/21 0158 05/21/21 0043 05/22/21 0041  NA 143 135 137 135 135  K 4.3 4.7 3.4*  3.4* 3.5 3.9  CL 104 100 102 100 100  CO2 25 20* 23 26 23   GLUCOSE 129* 98 104* 95 151*  BUN 39* 26* 38* 13 21  CREATININE 5.60* 3.61* 4.60* 2.63* 3.83*  CALCIUM 8.8* 7.8* 7.7* 7.5* 7.9*  MG  --   --  1.8  --   --   PHOS 4.9* 2.8 3.3 2.0* 3.2    GFR Estimated Creatinine Clearance: 9.9 mL/min (A) (by C-G formula based on SCr of 3.83 mg/dL (H)). Liver Function Tests: Recent Labs  Lab 05/17/21 0808 05/18/21 0112 05/18/21 1232 05/19/21 1003  05/20/21 0158 05/21/21 0043 05/22/21 0041  AST 28  --   --   --   --   --   --   ALT 16  --   --   --   --   --   --   ALKPHOS 87  --   --   --   --   --   --   BILITOT 1.8*  --   --   --   --   --   --   PROT 7.9  --   --   --   --   --   --  ALBUMIN 3.5   < > 2.9* 2.9* 2.4* 2.2* 2.2*   < > = values in this interval not displayed.    Recent Labs  Lab 05/17/21 0808  LIPASE 26    Recent Labs  Lab 05/17/21 0808  AMMONIA 37*    Coagulation profile No results for input(s): INR, PROTIME in the last 168 hours. COVID-19 Labs  No results for input(s): DDIMER, FERRITIN, LDH, CRP in the last 72 hours.  Lab Results  Component Value Date   SARSCOV2NAA NEGATIVE 05/17/2021   [place] NEGATIVE 04/28/2021   [place] NEGATIVE 04/09/2021   [place] NEGATIVE 01/17/2021    CBC: Recent Labs  Lab 05/17/21 0808 05/17/21 1146 05/18/21 0112 05/18/21 1232 05/21/21 1408 05/22/21 0041  WBC 23.6*  --  16.4* 16.4* 7.9 8.8  NEUTROABS 22.0*  --   --   --  4.9  --   HGB 10.2* 9.9* 9.0* 9.0* 7.7* 8.0*  HCT 32.3* 29.0* 28.2* 28.6* 24.0* 24.3*  MCV 92.6  --  92.2 94.1 90.9 91.0  PLT 202  --  220 255 251 205    Cardiac Enzymes: No results for input(s): CKTOTAL, CKMB, CKMBINDEX, TROPONINI in the last 168 hours. BNP (last 3 results) No results for input(s): PROBNP in the last 8760 hours. CBG: Recent Labs  Lab 05/21/21 1658 05/21/21 2018 05/21/21 2020 05/22/21 0018 05/22/21 0345  GLUCAP 98 89 90 149* 135*    D-Dimer: No results for input(s): DDIMER in the last 72 hours. Hgb A1c: No results for input(s): HGBA1C in the last 72 hours. Lipid Profile: No results for input(s): CHOL, HDL, LDLCALC, TRIG, CHOLHDL, LDLDIRECT in the last 72 hours. Thyroid function studies: No results for input(s): TSH, T4TOTAL, T3FREE, THYROIDAB in the last 72 hours.  Invalid input(s): FREET3 Anemia work up: No results for input(s): VITAMINB12, FOLATE, FERRITIN, TIBC, IRON, RETICCTPCT in the  last 72 hours. Sepsis Labs: Recent Labs  Lab 05/17/21 0936 05/17/21 1138 05/18/21 0112 05/18/21 1232 05/21/21 1408 05/22/21 0041  WBC  --   --  16.4* 16.4* 7.9 8.8  LATICACIDVEN 1.8 1.4  --   --   --   --     Microbiology Recent Results (from the past 240 hour(s))  Culture, blood (routine x 2)     Status: None   Collection Time: 05/17/21  9:48 AM   Specimen: BLOOD LEFT FOREARM  Result Value Ref Range Status   Specimen Description BLOOD LEFT FOREARM  Final   Special Requests   Final    BOTTLES DRAWN AEROBIC AND ANAEROBIC Blood Culture adequate volume   Culture   Final    NO GROWTH 5 DAYS Performed at [facility], [place] [address] [place], [place] [address]    Report Status 05/22/2021 FINAL  Final  Culture, blood (routine x 2)     Status: None   Collection Time: 05/17/21  9:53 AM   Specimen: BLOOD LEFT FOREARM  Result Value Ref Range Status   Specimen Description BLOOD LEFT FOREARM  Final   Special Requests   Final    BOTTLES DRAWN AEROBIC AND ANAEROBIC Blood Culture adequate volume   Culture   Final    NO GROWTH 5 DAYS Performed at [facility], [place] [address], [place], [place] [address]    Report Status 05/22/2021 FINAL  Final  Resp Panel by RT-PCR (Flu A&B, Covid) Nasopharyngeal Swab     Status: None   Collection Time: 05/17/21 11:28 AM   Specimen: Nasopharyngeal Swab; Nasopharyngeal(NP) swabs  in vial transport medium  Result Value Ref Range Status   SARS Coronavirus 2 by RT PCR NEGATIVE NEGATIVE Final    Comment: (NOTE) SARS-CoV-2 target nucleic acids are NOT DETECTED.  The SARS-CoV-2 RNA is generally detectable in upper respiratory specimens during the acute phase of infection. The lowest concentration of SARS-CoV-2 viral copies this assay can detect is 138 copies/mL. A negative result does not preclude SARS-Cov-2 infection and should not be used as the sole basis for treatment or other patient management decisions. A negative result may occur with   improper specimen collection/handling, submission of specimen other than nasopharyngeal swab, presence of viral mutation(s) within the areas targeted by this assay, and inadequate number of viral copies(<138 copies/mL). A negative result must be combined with clinical observations, patient history, and epidemiological information. The expected result is Negative.  Fact Sheet for Patients:  EntrepreneurPulse.com.au  Fact Sheet for Healthcare Providers:  IncredibleEmployment.be  This test is no t yet approved or cleared by the [place] FDA and  has been authorized for detection and/or diagnosis of SARS-CoV-2 by FDA under an Emergency Use Authorization (EUA). This EUA will remain  in effect (meaning this test can be used) for the duration of the COVID-19 declaration under Section 564(b)(1) of the Act, 21 U.S.C.section 360bbb-3(b)(1), unless the authorization is terminated  or revoked sooner.       Influenza A by PCR NEGATIVE NEGATIVE Final   Influenza B by PCR NEGATIVE NEGATIVE Final    Comment: (NOTE) The Xpert Xpress SARS-CoV-2/FLU/RSV plus assay is intended as an aid in the diagnosis of influenza from Nasopharyngeal swab specimens and should not be used as a sole basis for treatment. Nasal washings and aspirates are unacceptable for Xpert Xpress SARS-CoV-2/FLU/RSV testing.  Fact Sheet for Patients: EntrepreneurPulse.com.au  Fact Sheet for Healthcare Providers: IncredibleEmployment.be  This test is not yet approved or cleared by the [place] FDA and has been authorized for detection and/or diagnosis of SARS-CoV-2 by FDA under an Emergency Use Authorization (EUA). This EUA will remain in effect (meaning this test can be used) for the duration of the COVID-19 declaration under Section 564(b)(1) of the Act, 21 U.S.C. section 360bbb-3(b)(1), unless the authorization is terminated  or revoked.  Performed at [facility], [place] [address], [place], [place] [address]      Medications:    (feeding supplement) PROSource Plus  30 mL Oral TID BM   aspirin EC  81 mg Oral Daily   atorvastatin  40 mg Oral Daily   Chlorhexidine Gluconate Cloth  6 each Topical Q0600   darbepoetin (ARANESP) injection - DIALYSIS  100 mcg Intravenous Q Mon-HD   feeding supplement (NEPRO CARB STEADY)  237 mL Oral BID BM   heparin sodium (porcine)       insulin aspart  0-6 Units Subcutaneous Q4H   latanoprost  1 drop Both Eyes QHS   letrozole  2.5 mg Oral Daily   levETIRAcetam  500 mg Oral QPM   levothyroxine  112 mcg Oral Q0600   metoprolol succinate  50 mg Oral Daily   midodrine  5 mg Oral Q M,W,F-HD   multivitamin  1 tablet Oral QHS   sodium chloride flush  3 mL Intravenous Q12H   Continuous Infusions:  dextrose 40 mL/hr at 05/21/21 1856   vancomycin        LOS: 5 days   Charlynne Cousins  [facility]  05/22/2021, 11:13 AM

## 2021-05-22 NOTE — Progress Notes (Signed)
[facility] Progress Note   Subjective:  Seen on HD - 1L net UFG and tolerating. Per RN notes, was passing blood clots in stool overnight. Denies CP, abdominal pain, or dyspnea this morning.  Objective Vitals:   05/22/21 0442 05/22/21 0750 05/22/21 0755 05/22/21 0800  BP:  (!) 159/92 (!) 159/92 (!) 153/84  Pulse: 80 98 97 (!) 103  Resp:  (!) 22    Temp:  98.3 F (36.8 C)    TempSrc:  Axillary    SpO2: 95% 99%    Weight: 52 kg 57 kg    Height:       Physical Exam General: Frail woman, NAD. Room air. Heart: RRR; no murmur Lungs: CTA anteriorly, no wheezing or rales Abdomen: soft, non-tender Extremities: No LE edema Dialysis Access: TDC in R chest, non-tender  Additional Objective Labs: Basic Metabolic Panel: Recent Labs  Lab 05/20/21 0158 05/21/21 0043 05/22/21 0041  NA 137 135 135  K 3.4*  3.4* 3.5 3.9  CL 102 100 100  CO2 23 26 23   GLUCOSE 104* 95 151*  BUN 38* 13 21  CREATININE 4.60* 2.63* 3.83*  CALCIUM 7.7* 7.5* 7.9*  PHOS 3.3 2.0* 3.2   Liver Function Tests: Recent Labs  Lab 05/17/21 0808 05/18/21 0112 05/20/21 0158 05/21/21 0043 05/22/21 0041  AST 28  --   --   --   --   ALT 16  --   --   --   --   ALKPHOS 87  --   --   --   --   BILITOT 1.8*  --   --   --   --   PROT 7.9  --   --   --   --   ALBUMIN 3.5   < > 2.4* 2.2* 2.2*   < > = values in this interval not displayed.   CBC: Recent Labs  Lab 05/17/21 0808 05/17/21 1146 05/18/21 0112 05/18/21 1232 05/21/21 1408 05/22/21 0041  WBC 23.6*  --  16.4* 16.4* 7.9 8.8  NEUTROABS 22.0*  --   --   --  4.9  --   HGB 10.2*   < > 9.0* 9.0* 7.7* 8.0*  HCT 32.3*   < > 28.2* 28.6* 24.0* 24.3*  MCV 92.6  --  92.2 94.1 90.9 91.0  PLT 202  --  220 255 251 205   < > = values in this interval not displayed.   Medications:  dextrose 40 mL/hr at 05/21/21 1856   vancomycin      heparin sodium (porcine)       (feeding supplement) PROSource Plus  30 mL Oral TID BM   aspirin EC  81 mg  Oral Daily   atorvastatin  40 mg Oral Daily   Chlorhexidine Gluconate Cloth  6 each Topical Q0600   darbepoetin (ARANESP) injection - DIALYSIS  100 mcg Intravenous Q Mon-HD   feeding supplement (NEPRO CARB STEADY)  237 mL Oral BID BM   insulin aspart  0-6 Units Subcutaneous Q4H   latanoprost  1 drop Both Eyes QHS   letrozole  2.5 mg Oral Daily   levETIRAcetam  500 mg Oral QPM   levothyroxine  112 mcg Oral Q0600   metoprolol succinate  50 mg Oral Daily   midodrine  5 mg Oral Q M,W,F-HD   multivitamin  1 tablet Oral QHS   sodium chloride flush  3 mL Intravenous Q12H    Dialysis Orders: MWF at [facility] 4hr, 400/500, EDW  55kg, 2K/2.5Ca, TDC, no heparin - Mircera 22mcg q 2 weeks (ordered, not given)  Assessment/Plan: 1. AMS: Unclear etiology, likely multifactorial. ?aspiration pneumonia. Prior CVA. Recent bacteremia, but no vegetations on TEE. Bcx 6/12 negative. Needs ongoing [place] discussions with family. 2. ?Pneumonia/leukocytosis: Possible aspiration pneumonia. On Vanc (below) , s/p course of Zosyn. WBC improving. 3. Nausea/vomiting: On admit, resolved at this time. 4. ESRD: Continue HD per usual MWF schedule - HD today. 1L net UFG. Sitter required with HD. 5. HTN/volume: BP higher side now. On metoprolol and mido pre-HD for intra-dialytic hypotension. 6. Anemia of ESRD: Hgb 8, continue Aranesp q Monday. Passing blood in stool overnight. 7. Secondary hyperparathyroidism: CorrCa/Phos ok without binders or VDRA. 8. Nutrition: Alb low, on Nepro supplements. 9. Hx GBS bacteremia: Finishing 6-wk course Vanc (through 6/27). 10. Hx seizure disorder: On Keppra. 11. T2DM: Hypoglycemia this admit - on D10 drip. Per primary. 12. Hypothyroidism   Veneta Penton, PA-C 05/22/2021, 8:36 AM  [facility]

## 2021-05-22 NOTE — Progress Notes (Signed)
Modified Barium Swallow Progress Note  Patient Details  Name: Kirsten Mcmahon MRN: [id number] Date of Birth: [birth year]  Today's Date: 05/22/2021  Modified Barium Swallow completed.  Full report located under Chart Review in the Imaging Section.  Brief recommendations include the following:  Clinical Impression  Pt presents with mild oropharyngeal dysphagia characterized by impaired bolus control, premature spillage to the pyriform sinuses, and a pharyngeal delay which resulted in intermittent transient penetration (PAS 2) with thin liquids which is WNL. However, pt's positioning was optimal during the study with a fully-upright position and use of a pillow to facilitate pt remaining at midline. Considering her presentation at beside, SLP anticipates laryngeal invsation progresses beyond PAS 2 when pt's positioning is suboptimal due to her being reclined and/or due to her leaning to the right. Pt's current diet (dys3/thin) will be continued and SLP will follow briefly for further education.   Swallow Evaluation Recommendations       SLP Diet Recommendations: Thin liquid;Dysphagia 3 (Mech soft) solids   Liquid Administration via: Cup;Straw   Medication Administration: Crushed with puree   Supervision: Full supervision/cueing for compensatory strategies   Compensations: Minimize environmental distractions;Slow rate;Small sips/bites   Postural Changes: Seated upright at 90 degrees   Oral Care Recommendations: Oral care BID       [name] I. Hardin Negus, [place], [place] Office number [phone] Pager [phone]  [name] 05/22/2021,3:19 PM

## 2021-05-22 NOTE — TOC Progression Note (Signed)
Transition of Care [facility]) - Progression Note    Patient Details  Name: Kirsten Mcmahon MRN: [id number] Date of Birth: 08-Feb-1946  [facility] of [facility] [place]) CM/SW [place], LCSW Phone Number: 05/22/2021, 5:10 PM  Clinical Narrative:    CSW spoke with Lorie Apley and provided an update. She is requesting that [facility] contact [name] [phone]) at discharge so they will know when to expect patient.    Expected Discharge Plan: Skilled Nursing Facility Barriers to Discharge: Continued Medical Work up, Requiring sitter/restraints  Expected Discharge Plan and Services Expected Discharge Plan: [place] In-house Referral: Clinical Social Work     Living arrangements for the past 2 months: Single Family Home Expected Discharge Date: 05/21/21                                     Social Determinants of Health (SDOH) Interventions    Readmission Risk Interventions Readmission Risk Prevention Plan 05/20/2021 05/16/2021 04/11/2021  Transportation Screening Complete Complete Complete  PCP or Specialist Appt within 3-5 Days - - Complete  HRI or [place] - - Complete  Social Work Consult for [place] Planning/Counseling - - Complete  Palliative Care Screening - - Not Applicable  Medication Review Press photographer) Complete Complete Complete  PCP or Specialist appointment within 3-5 days of discharge Complete Complete -  [place] or Home Care Consult Complete Complete -  SW Recovery Care/Counseling Consult - Complete -  Palliative Care Screening Complete Complete -  [place] Patient Refused Patient Refused -  Some recent data might be hidden

## 2021-05-22 NOTE — Consult Note (Signed)
Consultation Note Date: 05/22/2021   Patient Name: Kirsten Mcmahon  DOB: [birth year]  MRN: [id number]  Age / Sex: 75 y.o., female  PCP: [place] Referring Physician: Charlynne Cousins, MD  Reason for Consultation: Establishing goals of care  HPI/Patient Profile: 75 y.o. female  with past medical history of ESRD on HD, HTN, CVA w/ L side deficit, seizures, T2DM, and streptococcal bacteremia admitted on 05/17/2021 with confusion and vomiting. Diagnosed with aspiration pneumonia.  PMT consulted to discuss [place].  Please note patient was recent seen by PMT provider Dr. Micheline Rough 6/2 and 6/3 and at that time goals were clear for full code/full scope care.  Clinical Assessment and Goals of Care: I have reviewed medical records including EPIC notes, labs and imaging, received report from RN, assessed the patient and then spoke with patient's son [name]  to discuss diagnosis prognosis, [place], EOL wishes, disposition and options.  Patient was able to interact with me some. RN reports significant improvement in mental status. She is feeding herself in bed. She tells me she wants to go home with her son. She is a bit slow to respond and does not have enough insight into her condition to discuss goals of care.  When speaking with son, I introduced Palliative Medicine as specialized medical care for people living with serious illness. It focuses on providing relief from the symptoms and stress of a serious illness. The goal is to improve quality of life for both the patient and the family.  Son reports he remembers previous goals of care discussions a couple of weeks ago and reports that his goals have not changed - he remains interested in any/all interventions to prolong life.   We discussed patient's current illness and what it means in the larger context of patient's on-going co-morbidities. We discuss her ongoing need for HD and  sometimes poor toleration of HD - he is committed to going to sit with her at HD outpatient.   I attempted to elicit values and goals of care important to the patient.  He tells me what is most important to him and patient is that she be at home - he is not interested in rehab placement. He tells me he has hospital bed and hoyer lift at home and is prepared to provide whatever care she needs. He has also hired additional caregivers and has other family members available for support. He also has the support of [facility].  Discussed with son the importance of continued conversation with family and the medical providers regarding overall plan of care and treatment options, ensuring decisions are within the context of the patient's values and GOCs.    Questions and concerns were addressed. The family was encouraged to call with questions or concerns.    Primary Decision Maker NEXT OF KIN - son [name]    SUMMARY OF RECOMMENDATIONS   - full code/full scope  - family committed to continue with any intervention to prolong life - son plans to sit with patient at outpatient HD and has hired additional caregivers at home - son declines rehab placement  - No other palliative specific recommendations at this point.  We will continue to follow peripherally.  Please call if there are specific needs with which our team can be of assistance  Code Status/Advance Care Planning: Full code  Discharge Planning: Home with Home Health      Primary Diagnoses: Present on Admission:  Acute metabolic encephalopathy  Type II diabetes mellitus with  renal manifestations (HCC)  Streptococcal bacteremia  Leukocytosis  Hypothyroidism  Hypertension  ESRD (end stage renal disease) ([place])  Anemia in chronic kidney disease (CKD)   I have reviewed the medical record, interviewed the patient and family, and examined the patient. The following aspects are pertinent.  Past Medical History:  Diagnosis Date   Cancer [facility])  2017   Right breast   Depression    GERD (gastroesophageal reflux disease)    Glaucoma    Hemiparesis (HCC)    left side   High cholesterol    History of seizure    x 1 - after a spider bite   History of stroke with residual deficit    left-side weakness   Hypertension    states BP under control with meds., has been on med. x 2 yr.   Hypothyroidism    Non-insulin dependent type 2 diabetes mellitus (HCC)    Overactive bladder    Stroke ([place])    1998 weakness on left side   Social History   Socioeconomic History   Marital status: Single    Spouse name: Not on file   Number of children: Not on file   Years of education: Not on file   Highest education level: Not on file  Occupational History   Not on file  Tobacco Use   Smoking status: Never   Smokeless tobacco: Never  Vaping Use   Vaping Use: Never used  Substance and Sexual Activity   Alcohol use: No   Drug use: No   Sexual activity: Never    Birth control/protection: Post-menopausal  Other Topics Concern   Not on file  Social History Narrative   Lives with son and wife   Social Determinants of Health   Financial Resource Strain: Not on file  Food Insecurity: Not on file  Transportation Needs: Not on file  Physical Activity: Not on file  Stress: Not on file  Social Connections: Not on file   Family History  Problem Relation Age of Onset   Cancer Brother        possible prostate cancer per her daughter   Breast cancer Neg Hx    Scheduled Meds:  (feeding supplement) PROSource Plus  30 mL Oral TID BM   aspirin EC  81 mg Oral Daily   atorvastatin  40 mg Oral Daily   Chlorhexidine Gluconate Cloth  6 each Topical Q0600   darbepoetin (ARANESP) injection - DIALYSIS  100 mcg Intravenous Q Mon-HD   feeding supplement (NEPRO CARB STEADY)  237 mL Oral BID BM   insulin aspart  0-6 Units Subcutaneous Q4H   latanoprost  1 drop Both Eyes QHS   letrozole  2.5 mg Oral Daily   levETIRAcetam  500 mg Oral QPM    levothyroxine  112 mcg Oral Q0600   metoprolol succinate  50 mg Oral Daily   midodrine  5 mg Oral Q M,W,F-HD   multivitamin  1 tablet Oral QHS   sodium chloride flush  3 mL Intravenous Q12H   Continuous Infusions:  dextrose 40 mL/hr at 05/22/21 1233   vancomycin     PRN Meds:.acetaminophen **OR** acetaminophen, albuterol, dextrose, hydrALAZINE Allergies  Allergen Reactions   Contrast Media [Iodinated Diagnostic Agents]     Other reaction(s): NO ALLERGY   Latex     Other reaction(s): NO ALLERGY   Shellfish-Derived Products     Other reaction(s): NO ALLERGY   Levemir [Insulin Detemir] Itching   Review of Systems  Unable to perform ROS:  Mental status change   Physical Exam Constitutional:      General: She is not in acute distress. Pulmonary:     Effort: Pulmonary effort is normal.  Neurological:     Mental Status: She is alert. She is disoriented.    Vital Signs: BP 128/81 (BP Location: Right Arm)   Pulse 100   Temp 98.1 F (36.7 C) (Oral)   Resp 18   Ht 5\' 2"  (1.575 m)   Wt 57 kg   SpO2 100%   BMI 22.98 kg/m  Pain Scale: 0-10   Pain Score: 0-No pain   SpO2: SpO2: 100 % O2 Device:SpO2: 100 % O2 Flow Rate: .   IO: Intake/output summary:  Intake/Output Summary (Last 24 hours) at 05/22/2021 1458 Last data filed at 05/22/2021 1335 Gross per 24 hour  Intake 730.55 ml  Output --  Net 730.55 ml    LBM: Last BM Date: 05/21/21 Baseline Weight: Weight: 50.7 kg Most recent weight: Weight: 57 kg     Palliative Assessment/Data: PPS 30%    Time Total: 60 minutes Greater than 50%  of this time was spent counseling and coordinating care related to the above assessment and plan.  Juel Burrow, DNP, AGNP-C Palliative Medicine Team [phone] Pager: [phone]

## 2021-05-23 DIAGNOSIS — N186 End stage renal disease: Secondary | ICD-10-CM | POA: Diagnosis not present

## 2021-05-23 DIAGNOSIS — G9341 Metabolic encephalopathy: Secondary | ICD-10-CM | POA: Diagnosis not present

## 2021-05-23 LAB — CBC
HCT: 24.7 % — ABNORMAL LOW (ref 36.0–46.0)
Hemoglobin: 7.8 g/dL — ABNORMAL LOW (ref 12.0–15.0)
MCH: 29.2 pg (ref 26.0–34.0)
MCHC: 31.6 g/dL (ref 30.0–36.0)
MCV: 92.5 fL (ref 80.0–100.0)
Platelets: 276 10*3/uL (ref 150–400)
RBC: 2.67 MIL/uL — ABNORMAL LOW (ref 3.87–5.11)
RDW: 14.6 % (ref 11.5–15.5)
WBC: 9.9 10*3/uL (ref 4.0–10.5)
nRBC: 0 % (ref 0.0–0.2)

## 2021-05-23 LAB — GLUCOSE, CAPILLARY
Glucose-Capillary: 112 mg/dL — ABNORMAL HIGH (ref 70–99)
Glucose-Capillary: 130 mg/dL — ABNORMAL HIGH (ref 70–99)
Glucose-Capillary: 117 mg/dL — ABNORMAL HIGH (ref 70–99)
Glucose-Capillary: 122 mg/dL — ABNORMAL HIGH (ref 70–99)
Glucose-Capillary: 166 mg/dL — ABNORMAL HIGH (ref 70–99)
Glucose-Capillary: 129 mg/dL — ABNORMAL HIGH (ref 70–99)

## 2021-05-23 NOTE — Plan of Care (Signed)
  Problem: Clinical Measurements: Goal: Will remain free from infection Outcome: Progressing Goal: Diagnostic test results will improve Outcome: Progressing Goal: Cardiovascular complication will be avoided Outcome: Progressing   Problem: Activity: Goal: Risk for activity intolerance will decrease Outcome: Progressing   Problem: Safety: Goal: Ability to remain free from injury will improve Outcome: Progressing   Problem: Education: Goal: Knowledge of disease and its progression will improve Outcome: Progressing   Problem: Fluid Volume: Goal: Compliance with measures to maintain balanced fluid volume will improve Outcome: Progressing

## 2021-05-23 NOTE — Progress Notes (Signed)
[facility] PROGRESS NOTE    Progress Note  Kirsten Mcmahon  SWN:[id number] DOB: 10/26/1946 DOA: 05/17/2021 PCP: [place]     Brief Narrative:   Kirsten Mcmahon is an 75 y.o. 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  Patient in the ED he was afebrile with respiration of 25 satting greater 92% on room air White count 23,000 hemoglobin 10, chest x-ray showed no acute findings, CT of the head stable compared to previous.  MRI of the brain was significantly related to motion, showed a few untreated acute infarctions in the right temporal and parietal lobe and bilateral thalamus.   Awaiting skilled nursing facility placement. Assessment/Plan:  Acute metabolic encephalopathy probably due to aspiration pneumonia Speech recommended admit BMP VS which eventually led to changing her diet to a dysphagia 3. 2-D echo 04/21/2019 showed an EF of 55% with grade 1 diastolic heart failure. No events Telemetry monitoring. Cont aspirin.  Acute nausea and vomiting: Chest x-ray showed no acute findings. CT scan of the abdomen no acute process, but it did showed right greater than left patchy airspace disease. Fecal impactation.  She was given an enema and started on MiraLAX p.o. twice daily.  Sepsis question due to aspiration PNA: White blood cell count is trending down, T-max 98.3 she is currently on on zosyn.   She completed a 5-day course of IV Zosyn. We will go ahead and transition her back to her IV vancomycin.  ESRD: Last HD 6.11. Renal  notified.  History of Streptococcus bacteremia: Continue her on IV Vanco per pharmacy. She will need to continue IV Vanco until 06/01/2021. Family declined on last admission TEE.  Anemia of chronic disease: Hbg stable compared to previous.  Essential hypertension: Cont metoprolol, relative well controlled.  Seizure disorder: Cont keppra.  Non-insulin diabetes mellitus: Cont SSI.  Hypothyroidism: Cont synthroid.  Elevated ammonia level: Unlikely to be causing patient symptoms.  1 episode of bloody bowel movement: She was continued on aspirin, has had no further bloody bowel movements. Check a CBC tomorrow morning.  Hemoglobin has remained relatively stable anywhere from 8-9.  Stage II pressure ulcer present on admission: RN Pressure Injury Documentation: Pressure Injury 04/29/21 Buttocks Mid Stage 2 -  Partial thickness loss of dermis presenting as a shallow open injury with a red, pink wound bed without slough. (Active)  04/29/21 0045  Location: Buttocks  Location Orientation: Mid  Staging: Stage 2 -  Partial thickness loss of dermis presenting as a shallow open injury with a red, pink wound bed without slough.  Wound Description (Comments):   Present on Admission: Yes     DVT prophylaxis: lovenox Family Communication:Son Status is: Inpatient  Remains inpatient appropriate because:Hemodynamically unstable  Dispo: The patient is from: Home              Anticipated d/c is to: SNF              Patient currently is not medically stable to d/c.   Difficult to place patient No    Code Status:     Code Status Orders  (  From admission, onward)           Start     Ordered   05/17/21 1231  Full code  Continuous        05/17/21 1231           Code Status History     Date Active Date Inactive Code Status Order ID Comments User Context   04/28/2021 1839 05/17/2021 0018 Full Code [id number]  Little Ishikawa, MD ED   04/09/2021 2102 04/24/2021 2217 Full Code  [id number]  Elwyn Reach, MD Inpatient   01/17/2021 1921 01/21/2021 2109 Full Code [id number]  Ivor Costa, MD ED   07/15/2017 1640 07/16/2017 2032 Full Code [id number]  Armandina Gemma, MD Inpatient         IV Access:   Peripheral IV   Procedures and diagnostic studies:   DG Swallowing Func-Speech Pathology  Result Date: 05/22/2021 Formatting of this result is different from the original. Objective Swallowing Evaluation: Type of Study: MBS-Modified Barium Swallow Study  Patient Details Name: Kirsten Mcmahon MRN: [id number] Date of Birth: 1946-02-24 Today's Date: 05/22/2021 Time: SLP Start Time (ACUTE ONLY): 1400 -SLP Stop Time (ACUTE ONLY): 1423 SLP Time Calculation (min) (ACUTE ONLY): 23 min Past Medical History: Past Medical History: Diagnosis Date  Cancer ([place]) 2017  Right breast  Depression   GERD (gastroesophageal reflux disease)   Glaucoma   Hemiparesis ([facility])   left side  High cholesterol   History of seizure   x 1 - after a spider bite  History of stroke with residual deficit   left-side weakness  Hypertension   states BP under control with meds., has been on med. x 2 yr.  Hypothyroidism   Non-insulin dependent type 2 diabetes mellitus ([place])   Overactive bladder   Stroke ([place])   1998 weakness on left side Past Surgical History: Past Surgical History: Procedure Laterality Date  ABDOMINAL HYSTERECTOMY    complete  BREAST BIOPSY Left 08/03/2018  Benign adipose tissue  BREAST EXCISIONAL BIOPSY Right 2014  Positive  BREAST LUMPECTOMY Right   BUBBLE STUDY  05/15/2021  Procedure: BUBBLE STUDY;  Surgeon: Jerline Pain, MD;  Location: [facility];  Service: Cardiovascular;;  CATARACT EXTRACTION W/ INTRAOCULAR LENS IMPLANT Left   CEREBRAL ANEURYSM REPAIR  1998  DIALYSIS/PERMA CATHETER INSERTION N/A 04/10/2021  Procedure: DIALYSIS/PERMA CATHETER INSERTION;  Surgeon: Algernon Huxley, MD;  Location: [facility];  Service: Cardiovascular;  Laterality: N/A;  IR FLUORO GUIDE CV LINE RIGHT  04/30/2021  PICC LINE  INSERTION    TEE WITHOUT CARDIOVERSION N/A 05/15/2021  Procedure: TRANSESOPHAGEAL ECHOCARDIOGRAM (TEE);  Surgeon: Jerline Pain, MD;  Location: [facility] ENDOSCOPY;  Service: Cardiovascular;  Laterality: N/A;  THYROID LOBECTOMY Right 07/15/2017  Procedure: RIGHT THYROID LOBECTOMY;  Surgeon: Armandina Gemma, MD;  Location: [facility];  Service: General;  Laterality: Right; HPI: Pt is a 75 y.o. female presented to ED for AMS and episode of vomiting. Per EMR, pt's son reported that shortly prior to admission he tried to feed the pt, but she started vomiting dark green material with foul odor and seemed as though she were choking on the emesis. CT head (05/17/21) with no acute findings, CXR (05/17/21) with no acute cardiopulmonary disease, CT abd (05/17/21) with patchy airspace disease R>L lung suspicious for PNA and possible fecal impaction. She was recently admitted for encephalopathy of unclear origin and acute stroke, discharged 05/16/21. PMH: ESRD on HD, HTN, HLD, CVA with residual left-sided hemiparesis, seizure disorder, hypothyroidism,  DM type  II, streptococcal bacteremia on IV vancomycin with dialysis, and intermittent encephalopathy. Last BSE (04/21/21) revealed adequate oropharygeal swallow with Dysphagia 2/thin liquid diet recommended. Failed [facility] due to lethargy per RN (05/18/21).  Subjective: pt awake, verbally engaged w/ this SLP. answered basic questions and followed instructions w/ cues. helped to feed self w/ support. Noted LUE weakness baseline d/t old stroke. Assessment / Plan / Recommendation CHL IP CLINICAL IMPRESSIONS 05/22/2021 Clinical Impression Pt presents with mild oropharyngeal dysphagia characterized by impaired bolus control, premature spillage to the pyriform sinuses, and a pharyngeal delay which resulted in intermittent transient penetration (PAS 2) with thin liquids which is WNL. However, pt's positioning was optimal during the study with a fully-upright position and use of a pillow to facilitate pt  remaining at midline. Considering her presentation at beside, SLP anticipates laryngeal invsation progresses beyond PAS 2 when pt's positioning is suboptimal due to her being reclined and/or due to her leaning to the right. Pt's current diet (dys3/thin) will be continued and SLP will follow briefly for further education. SLP Visit Diagnosis Dysphagia, oropharyngeal phase (R13.12) Attention and concentration deficit following -- Frontal lobe and executive function deficit following -- Impact on safety and function Mild aspiration risk   CH[facility] IP TREATMENT RECOMMENDATION 05/22/2021 Treatment Recommendations Therapy as outlined in treatment plan below   Prognosis 05/22/2021 Prognosis for Safe Diet Advancement Fair Barriers to Reach Goals Cognitive deficits Barriers/Prognosis Comment -- [facility] IP DIET RECOMMENDATION 05/22/2021 SLP Diet Recommendations Thin liquid;Dysphagia 3 (Mech soft) solids Liquid Administration via Cup;Straw Medication Administration Crushed with puree Compensations Minimize environmental distractions;Slow rate;Small sips/bites Postural Changes Seated upright at 90 degrees   CHL IP OTHER RECOMMENDATIONS 05/22/2021 Recommended Consults -- Oral Care Recommendations Oral care BID Other Recommendations --   CH[facility] IP FOLLOW UP RECOMMENDATIONS 05/22/2021 Follow up Recommendations Skilled Nursing facility   [facility] IP FREQUENCY AND DURATION 05/22/2021 Speech Therapy Frequency (ACUTE ONLY) min 1 x/week Treatment Duration 1 week      CHL IP ORAL PHASE 05/22/2021 Oral Phase Impaired Oral - Pudding Teaspoon -- Oral - Pudding Cup -- Oral - Honey Teaspoon -- Oral - Honey Cup -- Oral - Nectar Teaspoon -- Oral - Nectar Cup -- Oral - Nectar Straw Premature spillage;Decreased bolus cohesion Oral - Thin Teaspoon -- Oral - Thin Cup Premature spillage;Decreased bolus cohesion Oral - Thin Straw Premature spillage;Decreased bolus cohesion Oral - Puree Premature spillage;Decreased bolus cohesion Oral - Mech Soft -- Oral - Regular -- Oral -  Multi-Consistency -- Oral - Pill Premature spillage;Decreased bolus cohesion;Reduced posterior propulsion (pt unable to demonstrate A-P transport with barium tablet with puree/thin liquids) Oral Phase - Comment --  CHL IP PHARYNGEAL PHASE 05/22/2021 Pharyngeal Phase Impaired Pharyngeal- Pudding Teaspoon -- Pharyngeal -- Pharyngeal- Pudding Cup -- Pharyngeal -- Pharyngeal- Honey Teaspoon -- Pharyngeal -- Pharyngeal- Honey Cup -- Pharyngeal -- Pharyngeal- Nectar Teaspoon -- Pharyngeal -- Pharyngeal- Nectar Cup -- Pharyngeal -- Pharyngeal- Nectar Straw Delayed swallow initiation-pyriform sinuses Pharyngeal -- Pharyngeal- Thin Teaspoon -- Pharyngeal -- Pharyngeal- Thin Cup Delayed swallow initiation-pyriform sinuses Pharyngeal -- Pharyngeal- Thin Straw Delayed swallow initiation-pyriform sinuses Pharyngeal -- Pharyngeal- Puree Delayed swallow initiation-pyriform sinuses;Delayed swallow initiation-vallecula Pharyngeal -- Pharyngeal- Mechanical Soft -- Pharyngeal -- Pharyngeal- Regular Delayed swallow initiation-vallecula Pharyngeal -- Pharyngeal- Multi-consistency -- Pharyngeal -- Pharyngeal- Pill -- Pharyngeal -- Pharyngeal Comment --  CHL IP CERVICAL ESOPHAGEAL PHASE 05/22/2021 Cervical Esophageal Phase WFL Pudding Teaspoon -- Pudding Cup -- Honey Teaspoon -- Honey Cup -- Nectar Teaspoon -- Nectar Cup -- Nectar Straw -- Thin Teaspoon --  Thin Cup -- Thin Straw -- Puree -- Mechanical Soft -- Regular -- Multi-consistency -- Pill -- Cervical Esophageal Comment -- Kirsten Mcmahon, [place], [place] Office number [phone] Pager [place] 05/22/2021, 3:22 PM                Medical Consultants:   None.   Subjective:    Kirsten Mcmahon no complaints tolerated her diet this morning.  Objective:    Vitals:   05/22/21 1935 05/22/21 2341 05/23/21 0352 05/23/21 0818  BP: (!) 157/80 (!) 149/82 140/86 (!) 158/89  Pulse: 93 90 96 97  Resp: 18 18 15 18   Temp: 98.3  F (36.8 C) 97.8 F (36.6 C) 98 F (36.7 C) 98.2 F (36.8 C)  TempSrc: Oral Oral Oral Axillary  SpO2: 98% 97% 100% 99%  Weight:      Height:       SpO2: 99 %   Intake/Output Summary (Last 24 hours) at 05/23/2021 0917 Last data filed at 05/22/2021 1335 Gross per 24 hour  Intake 200 ml  Output 1000 ml  Net -800 ml    Filed Weights   05/20/21 1510 05/22/21 0442 05/22/21 0750  Weight: 51.5 kg 52 kg 57 kg    Exam: General exam: In no acute distress. Respiratory system: Good air movement and clear to auscultation. Cardiovascular system: S1 & S2 heard, RRR. No JVD. Gastrointestinal system: Abdomen is nondistended, soft and nontender.  Extremities: No pedal edema. Skin: No rashes, lesions or ulcers  Data Reviewed:    Labs: Basic Metabolic Panel: Recent Labs  Lab 05/18/21 1232 05/19/21 1003 05/20/21 0158 05/21/21 0043 05/22/21 0041  NA 143 135 137 135 135  K 4.3 4.7 3.4*  3.4* 3.5 3.9  CL 104 100 102 100 100  CO2 25 20* 23 26 23   GLUCOSE 129* 98 104* 95 151*  BUN 39* 26* 38* 13 21  CREATININE 5.60* 3.61* 4.60* 2.63* 3.83*  CALCIUM 8.8* 7.8* 7.7* 7.5* 7.9*  MG  --   --  1.8  --   --   PHOS 4.9* 2.8 3.3 2.0* 3.2    GFR Estimated Creatinine Clearance: 9.9 mL/min (A) (by C-G formula based on SCr of 3.83 mg/dL (H)). Liver Function Tests: Recent Labs  Lab 05/17/21 0808 05/18/21 0112 05/18/21 1232 05/19/21 1003 05/20/21 0158 05/21/21 0043 05/22/21 0041  AST 28  --   --   --   --   --   --   ALT 16  --   --   --   --   --   --   ALKPHOS 87  --   --   --   --   --   --   BILITOT 1.8*  --   --   --   --   --   --   PROT 7.9  --   --   --   --   --   --   ALBUMIN 3.5   < > 2.9* 2.9* 2.4* 2.2* 2.2*   < > = values in this interval not displayed.    Recent Labs  Lab 05/17/21 0808  LIPASE 26    Recent Labs  Lab 05/17/21 0808  AMMONIA 37*    Coagulation profile No results for input(s): INR, PROTIME in the last 168 hours. COVID-19 Labs  No results  for input(s): DDIMER, FERRITIN, LDH, CRP in the last 72 hours.  Lab Results  Component Value Date  [place] NEGATIVE 05/17/2021   SARSCOV2NAA NEGATIVE 04/28/2021   SARSCOV2NAA NEGATIVE 04/09/2021   [place] NEGATIVE 01/17/2021    CBC: Recent Labs  Lab 05/17/21 0808 05/17/21 1146 05/18/21 0112 05/18/21 1232 05/21/21 1408 05/22/21 0041 05/23/21 0101  WBC 23.6*  --  16.4* 16.4* 7.9 8.8 9.9  NEUTROABS 22.0*  --   --   --  4.9  --   --   HGB 10.2*   < > 9.0* 9.0* 7.7* 8.0* 7.8*  HCT 32.3*   < > 28.2* 28.6* 24.0* 24.3* 24.7*  MCV 92.6  --  92.2 94.1 90.9 91.0 92.5  PLT 202  --  220 255 251 205 276   < > = values in this interval not displayed.    Cardiac Enzymes: No results for input(s): CKTOTAL, CKMB, CKMBINDEX, TROPONINI in the last 168 hours. BNP (last 3 results) No results for input(s): PROBNP in the last 8760 hours. CBG: Recent Labs  Lab 05/22/21 1635 05/22/21 1937 05/22/21 2343 05/23/21 0355 05/23/21 0819  GLUCAP 152* 150* 125* 112* 130*    D-Dimer: No results for input(s): DDIMER in the last 72 hours. Hgb A1c: No results for input(s): HGBA1C in the last 72 hours. Lipid Profile: No results for input(s): CHOL, HDL, LDLCALC, TRIG, CHOLHDL, LDLDIRECT in the last 72 hours. Thyroid function studies: No results for input(s): TSH, T4TOTAL, T3FREE, THYROIDAB in the last 72 hours.  Invalid input(s): FREET3 Anemia work up: No results for input(s): VITAMINB12, FOLATE, FERRITIN, TIBC, IRON, RETICCTPCT in the last 72 hours. Sepsis Labs: Recent Labs  Lab 05/17/21 0936 05/17/21 1138 05/18/21 0112 05/18/21 1232 05/21/21 1408 05/22/21 0041 05/23/21 0101  WBC  --   --    < > 16.4* 7.9 8.8 9.9  LATICACIDVEN 1.8 1.4  --   --   --   --   --    < > = values in this interval not displayed.    Microbiology Recent Results (from the past 240 hour(s))  Culture, blood (routine x 2)     Status: None   Collection Time: 05/17/21  9:48 AM   Specimen: BLOOD LEFT  FOREARM  Result Value Ref Range Status   Specimen Description BLOOD LEFT FOREARM  Final   Special Requests   Final    BOTTLES DRAWN AEROBIC AND ANAEROBIC Blood Culture adequate volume   Culture   Final    NO GROWTH 5 DAYS Performed at [facility], [address], [place], [place] [address]    Report Status 05/22/2021 FINAL  Final  Culture, blood (routine x 2)     Status: None   Collection Time: 05/17/21  9:53 AM   Specimen: BLOOD LEFT FOREARM  Result Value Ref Range Status   Specimen Description BLOOD LEFT FOREARM  Final   Special Requests   Final    BOTTLES DRAWN AEROBIC AND ANAEROBIC Blood Culture adequate volume   Culture   Final    NO GROWTH 5 DAYS Performed at [facility], [place] [address], [place], [place] [address]    Report Status 05/22/2021 FINAL  Final  Resp Panel by RT-PCR (Flu A&B, Covid) Nasopharyngeal Swab     Status: None   Collection Time: 05/17/21 11:28 AM   Specimen: Nasopharyngeal Swab; Nasopharyngeal(NP) swabs in vial transport medium  Result Value Ref Range Status   SARS Coronavirus 2 by RT PCR NEGATIVE NEGATIVE Final    Comment: (NOTE) SARS-CoV-2 target nucleic acids are NOT DETECTED.  The SARS-CoV-2 RNA is generally detectable  in upper respiratory specimens during the acute phase of infection. The lowest concentration of SARS-CoV-2 viral copies this assay can detect is 138 copies/mL. A negative result does not preclude SARS-Cov-2 infection and should not be used as the sole basis for treatment or other patient management decisions. A negative result may occur with  improper specimen collection/handling, submission of specimen other than nasopharyngeal swab, presence of viral mutation(s) within the areas targeted by this assay, and inadequate number of viral copies(<138 copies/mL). A negative result must be combined with clinical observations, patient history, and epidemiological information. The expected result is Negative.  Fact Sheet for  Patients:  EntrepreneurPulse.com.au  Fact Sheet for Healthcare Providers:  IncredibleEmployment.be  This test is no t yet approved or cleared by the [place] FDA and  has been authorized for detection and/or diagnosis of SARS-CoV-2 by FDA under an Emergency Use Authorization (EUA). This EUA will remain  in effect (meaning this test can be used) for the duration of the COVID-19 declaration under Section 564(b)(1) of the Act, 21 U.S.C.section 360bbb-3(b)(1), unless the authorization is terminated  or revoked sooner.       Influenza A by PCR NEGATIVE NEGATIVE Final   Influenza B by PCR NEGATIVE NEGATIVE Final    Comment: (NOTE) The Xpert Xpress SARS-CoV-2/FLU/RSV plus assay is intended as an aid in the diagnosis of influenza from Nasopharyngeal swab specimens and should not be used as a sole basis for treatment. Nasal washings and aspirates are unacceptable for Xpert Xpress SARS-CoV-2/FLU/RSV testing.  Fact Sheet for Patients: EntrepreneurPulse.com.au  Fact Sheet for Healthcare Providers: IncredibleEmployment.be  This test is not yet approved or cleared by the [place] FDA and has been authorized for detection and/or diagnosis of SARS-CoV-2 by FDA under an Emergency Use Authorization (EUA). This EUA will remain in effect (meaning this test can be used) for the duration of the COVID-19 declaration under Section 564(b)(1) of the Act, 21 U.S.C. section 360bbb-3(b)(1), unless the authorization is terminated or revoked.  Performed at [facility], [place] [address], [place], [place] [address]      Medications:    (feeding supplement) PROSource Plus  30 mL Oral TID BM   aspirin EC  81 mg Oral Daily   atorvastatin  40 mg Oral Daily   Chlorhexidine Gluconate Cloth  6 each Topical Q0600   darbepoetin (ARANESP) injection - DIALYSIS  100 mcg Intravenous Q Mon-HD   feeding supplement (NEPRO CARB  STEADY)  237 mL Oral BID BM   insulin aspart  0-6 Units Subcutaneous Q4H   latanoprost  1 drop Both Eyes QHS   letrozole  2.5 mg Oral Daily   levETIRAcetam  500 mg Oral QPM   levothyroxine  112 mcg Oral Q0600   metoprolol succinate  50 mg Oral Daily   midodrine  5 mg Oral Q M,W,F-HD   multivitamin  1 tablet Oral QHS   sodium chloride flush  3 mL Intravenous Q12H   Continuous Infusions:  dextrose 40 mL/hr at 05/22/21 1233   vancomycin        LOS: 6 days   Charlynne Cousins  [facility]  05/23/2021, 9:17 AM

## 2021-05-23 NOTE — Progress Notes (Signed)
[facility] Progress Note   Subjective:  Seen in room - no new issues today. Denies CP or dyspnea. Breakfast tray at bedside - didn't eat much. Per notes, doesn't appear that had further bloody stools.  Objective Vitals:   05/22/21 1935 05/22/21 2341 05/23/21 0352 05/23/21 0818  BP: (!) 157/80 (!) 149/82 140/86 (!) 158/89  Pulse: 93 90 96 97  Resp: 18 18 15 18   Temp: 98.3 F (36.8 C) 97.8 F (36.6 C) 98 F (36.7 C) 98.2 F (36.8 C)  TempSrc: Oral Oral Oral Axillary  SpO2: 98% 97% 100% 99%  Weight:      Height:       Physical Exam General: Frail woman, NAD. Room air. Heart: RRR; no murmur Lungs: CTA anteriorly, no wheezing or rales Abdomen: soft, non-tender Extremities: No LE edema Dialysis Access: TDC in R chest, non-tender  Additional Objective Labs: Basic Metabolic Panel: Recent Labs  Lab 05/20/21 0158 05/21/21 0043 05/22/21 0041  NA 137 135 135  K 3.4*  3.4* 3.5 3.9  CL 102 100 100  CO2 23 26 23   GLUCOSE 104* 95 151*  BUN 38* 13 21  CREATININE 4.60* 2.63* 3.83*  CALCIUM 7.7* 7.5* 7.9*  PHOS 3.3 2.0* 3.2   Liver Function Tests: Recent Labs  Lab 05/17/21 0808 05/18/21 0112 05/20/21 0158 05/21/21 0043 05/22/21 0041  AST 28  --   --   --   --   ALT 16  --   --   --   --   ALKPHOS 87  --   --   --   --   BILITOT 1.8*  --   --   --   --   PROT 7.9  --   --   --   --   ALBUMIN 3.5   < > 2.4* 2.2* 2.2*   < > = values in this interval not displayed.   Recent Labs  Lab 05/17/21 0808  LIPASE 26   CBC: Recent Labs  Lab 05/17/21 0808 05/17/21 1146 05/18/21 0112 05/18/21 1232 05/21/21 1408 05/22/21 0041 05/23/21 0101  WBC 23.6*  --  16.4* 16.4* 7.9 8.8 9.9  NEUTROABS 22.0*  --   --   --  4.9  --   --   HGB 10.2*   < > 9.0* 9.0* 7.7* 8.0* 7.8*  HCT 32.3*   < > 28.2* 28.6* 24.0* 24.3* 24.7*  MCV 92.6  --  92.2 94.1 90.9 91.0 92.5  PLT 202  --  220 255 251 205 276   < > = values in this interval not displayed.    Studies/Results:  Medications:  dextrose 40 mL/hr at 05/22/21 1233   vancomycin      (feeding supplement) PROSource Plus  30 mL Oral TID BM   aspirin EC  81 mg Oral Daily   atorvastatin  40 mg Oral Daily   Chlorhexidine Gluconate Cloth  6 each Topical Q0600   darbepoetin (ARANESP) injection - DIALYSIS  100 mcg Intravenous Q Mon-HD   feeding supplement (NEPRO CARB STEADY)  237 mL Oral BID BM   insulin aspart  0-6 Units Subcutaneous Q4H   latanoprost  1 drop Both Eyes QHS   letrozole  2.5 mg Oral Daily   levETIRAcetam  500 mg Oral QPM   levothyroxine  112 mcg Oral Q0600   metoprolol succinate  50 mg Oral Daily   midodrine  5 mg Oral Q M,W,F-HD   multivitamin  1 tablet Oral QHS  sodium chloride flush  3 mL Intravenous Q12H    Dialysis Orders: MWF at [facility] 4hr, 400/500, EDW 55kg, 2K/2.5Ca, TDC, no heparin - Mircera 57mcg q 2 weeks (ordered, not given)   Assessment/Plan: 1. AMS: Unclear etiology, likely multifactorial. ?aspiration pneumonia. Prior CVA. Recent bacteremia, but no vegetations on TEE. Bcx 6/12 negative. Needs ongoing [place] discussions with family. 2. ?Pneumonia/leukocytosis: Possible aspiration pneumonia. On Vanc (below) , s/p course of Zosyn. WBC improving. 3. Nausea/vomiting: On admit, resolved at this time. 4. ESRD: Continue HD per usual MWF schedule - next HD Monday 6/20 if still inpatient. 5. HTN/volume: BP higher side now. On metoprolol and mido pre-HD for intra-dialytic hypotension. 6. Anemia of ESRD: Hgb 7.8, continue Aranesp q Monday (started 6/14). Bloody stool x 1 on 6/17, unclear if has resolved? 7. Secondary hyperparathyroidism: CorrCa/Phos ok without binders or VDRA. 8. Nutrition: Alb low, on Nepro supplements. 9. Hx GBS bacteremia: Finishing 6-wk course Vanc (through 6/27). 10. Hx seizure disorder: On Keppra. 11. T2DM: Hypoglycemia this admit - on D10 drip. Per primary. 12. Hypothyroidism  Kirsten Penton, PA-C 05/23/2021, 11:34 AM  [facility]

## 2021-05-24 DIAGNOSIS — R7881 Bacteremia: Secondary | ICD-10-CM | POA: Diagnosis not present

## 2021-05-24 DIAGNOSIS — G9341 Metabolic encephalopathy: Secondary | ICD-10-CM | POA: Diagnosis not present

## 2021-05-24 DIAGNOSIS — N186 End stage renal disease: Secondary | ICD-10-CM | POA: Diagnosis not present

## 2021-05-24 DIAGNOSIS — R4182 Altered mental status, unspecified: Secondary | ICD-10-CM | POA: Diagnosis not present

## 2021-05-24 DIAGNOSIS — J690 Pneumonitis due to inhalation of food and vomit: Secondary | ICD-10-CM

## 2021-05-24 LAB — CBC
HCT: 26.2 % — ABNORMAL LOW (ref 36.0–46.0)
Hemoglobin: 8.7 g/dL — ABNORMAL LOW (ref 12.0–15.0)
MCH: 30.2 pg (ref 26.0–34.0)
MCHC: 33.2 g/dL (ref 30.0–36.0)
MCV: 91 fL (ref 80.0–100.0)
Platelets: 301 10*3/uL (ref 150–400)
RBC: 2.88 MIL/uL — ABNORMAL LOW (ref 3.87–5.11)
RDW: 14.6 % (ref 11.5–15.5)
WBC: 9.2 10*3/uL (ref 4.0–10.5)
nRBC: 0 % (ref 0.0–0.2)

## 2021-05-24 LAB — GLUCOSE, CAPILLARY
Glucose-Capillary: 121 mg/dL — ABNORMAL HIGH (ref 70–99)
Glucose-Capillary: 120 mg/dL — ABNORMAL HIGH (ref 70–99)
Glucose-Capillary: 132 mg/dL — ABNORMAL HIGH (ref 70–99)

## 2021-05-24 NOTE — Progress Notes (Signed)
[facility] Progress Note   Subjective:  Seen in room - resting but mumbling yes or no to my questions. Denies CP or dyspnea. No new pains.   Objective Vitals:   05/23/21 2331 05/24/21 0353 05/24/21 0439 05/24/21 0750  BP: (!) 156/83 (!) 162/95  133/88  Pulse: 93 95  84  Resp: 20 16  19   Temp: 98.5 F (36.9 C) 98.9 F (37.2 C)  98.9 F (37.2 C)  TempSrc: Oral   Oral  SpO2: 100% 100%  99%  Weight:   56.8 kg   Height:       Physical Exam General: Frail woman, NAD. Room air. Heart: RRR; no murmur Lungs: CTA anteriorly, no wheezing or rales Abdomen: soft, non-tender Extremities: No LE edema Dialysis Access: TDC in R chest, non-tender  Additional Objective Labs: Basic Metabolic Panel: Recent Labs  Lab 05/20/21 0158 05/21/21 0043 05/22/21 0041  NA 137 135 135  K 3.4*  3.4* 3.5 3.9  CL 102 100 100  CO2 23 26 23   GLUCOSE 104* 95 151*  BUN 38* 13 21  CREATININE 4.60* 2.63* 3.83*  CALCIUM 7.7* 7.5* 7.9*  PHOS 3.3 2.0* 3.2   Liver Function Tests: Recent Labs  Lab 05/20/21 0158 05/21/21 0043 05/22/21 0041  ALBUMIN 2.4* 2.2* 2.2*   CBC: Recent Labs  Lab 05/18/21 1232 05/21/21 1408 05/22/21 0041 05/23/21 0101 05/24/21 0116  WBC 16.4* 7.9 8.8 9.9 9.2  NEUTROABS  --  4.9  --   --   --   HGB 9.0* 7.7* 8.0* 7.8* 8.7*  HCT 28.6* 24.0* 24.3* 24.7* 26.2*  MCV 94.1 90.9 91.0 92.5 91.0  PLT 255 251 205 276 301   Studies/Results: DG Swallowing Func-Speech Pathology  Result Date: 05/22/2021 Formatting of this result is different from the original. Objective Swallowing Evaluation: Type of Study: MBS-Modified Barium Swallow Study  Patient Details Name: Kirsten Mcmahon MRN: [id number] Date of Birth: 09/19/1946 Today's Date: 05/22/2021 Time: SLP Start Time (ACUTE ONLY): 1400 -SLP Stop Time (ACUTE ONLY): 1423 SLP Time Calculation (min) (ACUTE ONLY): 23 min Past Medical History: Past Medical History: Diagnosis Date  Cancer ([place]) 2017  Right breast  Depression    GERD (gastroesophageal reflux disease)   Glaucoma   Hemiparesis (H[facility])   left side  High cholesterol   History of seizure   x 1 - after a spider bite  History of stroke with residual deficit   left-side weakness  Hypertension   states BP under control with meds., has been on med. x 2 yr.  Hypothyroidism   Non-insulin dependent type 2 diabetes mellitus ([place])   Overactive bladder   Stroke ([place])   1998 weakness on left side Past Surgical History: Past Surgical History: Procedure Laterality Date  ABDOMINAL HYSTERECTOMY    complete  BREAST BIOPSY Left 08/03/2018  Benign adipose tissue  BREAST EXCISIONAL BIOPSY Right 2014  Positive  BREAST LUMPECTOMY Right   BUBBLE STUDY  05/15/2021  Procedure: BUBBLE STUDY;  Surgeon: Jerline Pain, MD;  Location: [facility];  Service: Cardiovascular;;  CATARACT EXTRACTION W/ INTRAOCULAR LENS IMPLANT Left   CEREBRAL ANEURYSM REPAIR  1998  DIALYSIS/PERMA CATHETER INSERTION N/A 04/10/2021  Procedure: DIALYSIS/PERMA CATHETER INSERTION;  Surgeon: Algernon Huxley, MD;  Location: [facility];  Service: Cardiovascular;  Laterality: N/A;  IR FLUORO GUIDE CV LINE RIGHT  04/30/2021  PICC LINE INSERTION    TEE WITHOUT CARDIOVERSION N/A 05/15/2021  Procedure: TRANSESOPHAGEAL ECHOCARDIOGRAM (TEE);  Surgeon: Jerline Pain, MD;  Location:  [facility];  Service: Cardiovascular;  Laterality: N/A;  THYROID LOBECTOMY Right 07/15/2017  Procedure: RIGHT THYROID LOBECTOMY;  Surgeon: Armandina Gemma, MD;  Location: [facility];  Service: General;  Laterality: Right; HPI: Pt is a 75 y.o. female presented to ED for AMS and episode of vomiting. Per EMR, pt's son reported that shortly prior to admission he tried to feed the pt, but she started vomiting dark green material with foul odor and seemed as though she were choking on the emesis. CT head (05/17/21) with no acute findings, CXR (05/17/21) with no acute cardiopulmonary disease, CT abd (05/17/21) with patchy airspace disease R>L lung suspicious for PNA and possible  fecal impaction. She was recently admitted for encephalopathy of unclear origin and acute stroke, discharged 05/16/21. PMH: ESRD on HD, HTN, HLD, CVA with residual left-sided hemiparesis, seizure disorder, hypothyroidism,  DM type II, streptococcal bacteremia on IV vancomycin with dialysis, and intermittent encephalopathy. Last BSE (04/21/21) revealed adequate oropharygeal swallow with Dysphagia 2/thin liquid diet recommended. Failed [facility] due to lethargy per RN (05/18/21).  Subjective: pt awake, verbally engaged w/ this SLP. answered basic questions and followed instructions w/ cues. helped to feed self w/ support. Noted LUE weakness baseline d/t old stroke. Assessment / Plan / Recommendation CHL IP CLINICAL IMPRESSIONS 05/22/2021 Clinical Impression Pt presents with mild oropharyngeal dysphagia characterized by impaired bolus control, premature spillage to the pyriform sinuses, and a pharyngeal delay which resulted in intermittent transient penetration (PAS 2) with thin liquids which is WNL. However, pt's positioning was optimal during the study with a fully-upright position and use of a pillow to facilitate pt remaining at midline. Considering her presentation at beside, SLP anticipates laryngeal invsation progresses beyond PAS 2 when pt's positioning is suboptimal due to her being reclined and/or due to her leaning to the right. Pt's current diet (dys3/thin) will be continued and SLP will follow briefly for further education. SLP Visit Diagnosis Dysphagia, oropharyngeal phase (R13.12) Attention and concentration deficit following -- Frontal lobe and executive function deficit following -- Impact on safety and function Mild aspiration risk   CHL IP TREATMENT RECOMMENDATION 05/22/2021 Treatment Recommendations Therapy as outlined in treatment plan below   Prognosis 05/22/2021 Prognosis for Safe Diet Advancement Fair Barriers to Reach Goals Cognitive deficits Barriers/Prognosis Comment -- CHL IP DIET RECOMMENDATION  05/22/2021 SLP Diet Recommendations Thin liquid;Dysphagia 3 (Mech soft) solids Liquid Administration via Cup;Straw Medication Administration Crushed with puree Compensations Minimize environmental distractions;Slow rate;Small sips/bites Postural Changes Seated upright at 90 degrees   CHL IP OTHER RECOMMENDATIONS 05/22/2021 Recommended Consults -- Oral Care Recommendations Oral care BID Other Recommendations --   CHL IP FOLLOW UP RECOMMENDATIONS 05/22/2021 Follow up Recommendations Skilled Nursing facility   [facility] IP FREQUENCY AND DURATION 05/22/2021 Speech Therapy Frequency (ACUTE ONLY) min 1 x/week Treatment Duration 1 week      CHL IP ORAL PHASE 05/22/2021 Oral Phase Impaired Oral - Pudding Teaspoon -- Oral - Pudding Cup -- Oral - Honey Teaspoon -- Oral - Honey Cup -- Oral - Nectar Teaspoon -- Oral - Nectar Cup -- Oral - Nectar Straw Premature spillage;Decreased bolus cohesion Oral - Thin Teaspoon -- Oral - Thin Cup Premature spillage;Decreased bolus cohesion Oral - Thin Straw Premature spillage;Decreased bolus cohesion Oral - Puree Premature spillage;Decreased bolus cohesion Oral - Mech Soft -- Oral - Regular -- Oral - Multi-Consistency -- Oral - Pill Premature spillage;Decreased bolus cohesion;Reduced posterior propulsion (pt unable to demonstrate A-P transport with barium tablet with puree/thin liquids) Oral Phase - Comment --  CHL IP  PHARYNGEAL PHASE 05/22/2021 Pharyngeal Phase Impaired Pharyngeal- Pudding Teaspoon -- Pharyngeal -- Pharyngeal- Pudding Cup -- Pharyngeal -- Pharyngeal- Honey Teaspoon -- Pharyngeal -- Pharyngeal- Honey Cup -- Pharyngeal -- Pharyngeal- Nectar Teaspoon -- Pharyngeal -- Pharyngeal- Nectar Cup -- Pharyngeal -- Pharyngeal- Nectar Straw Delayed swallow initiation-pyriform sinuses Pharyngeal -- Pharyngeal- Thin Teaspoon -- Pharyngeal -- Pharyngeal- Thin Cup Delayed swallow initiation-pyriform sinuses Pharyngeal -- Pharyngeal- Thin Straw Delayed swallow initiation-pyriform sinuses Pharyngeal --  Pharyngeal- Puree Delayed swallow initiation-pyriform sinuses;Delayed swallow initiation-vallecula Pharyngeal -- Pharyngeal- Mechanical Soft -- Pharyngeal -- Pharyngeal- Regular Delayed swallow initiation-vallecula Pharyngeal -- Pharyngeal- Multi-consistency -- Pharyngeal -- Pharyngeal- Pill -- Pharyngeal -- Pharyngeal Comment --  CHL IP CERVICAL ESOPHAGEAL PHASE 05/22/2021 Cervical Esophageal Phase WFL Pudding Teaspoon -- Pudding Cup -- Honey Teaspoon -- Honey Cup -- Nectar Teaspoon -- Nectar Cup -- Nectar Straw -- Thin Teaspoon -- Thin Cup -- Thin Straw -- Puree -- Mechanical Soft -- Regular -- Multi-consistency -- Pill -- Cervical Esophageal Comment -- Shanika I. Hardin Negus, [place], [place] Office number [phone] Pager [place] 05/22/2021, 3:22 PM              Medications:  dextrose 40 mL/hr at 05/22/21 1233   vancomycin      (feeding supplement) PROSource Plus  30 mL Oral TID BM   aspirin EC  81 mg Oral Daily   atorvastatin  40 mg Oral Daily   Chlorhexidine Gluconate Cloth  6 each Topical Q0600   darbepoetin (ARANESP) injection - DIALYSIS  100 mcg Intravenous Q Mon-HD   feeding supplement (NEPRO CARB STEADY)  237 mL Oral BID BM   insulin aspart  0-6 Units Subcutaneous Q4H   latanoprost  1 drop Both Eyes QHS   letrozole  2.5 mg Oral Daily   levETIRAcetam  500 mg Oral QPM   levothyroxine  112 mcg Oral Q0600   metoprolol succinate  50 mg Oral Daily   midodrine  5 mg Oral Q M,W,F-HD   multivitamin  1 tablet Oral QHS   sodium chloride flush  3 mL Intravenous Q12H    Dialysis Orders: MWF at [facility] 4hr, 400/500, EDW 55kg, 2K/2.5Ca, TDC, no heparin - Mircera 53mcg q 2 weeks (ordered, not given)   Assessment/Plan: 1. AMS: Unclear etiology, likely multifactorial. ?aspiration pneumonia. Prior CVA. Recent bacteremia, but no vegetations on TEE. BCx 6/12 negative. Needs ongoing [place] discussions with family. 2. ?Pneumonia/leukocytosis:  Possible aspiration pneumonia. On Vanc (below) , s/p course of Zosyn. WBC improving. 3. Nausea/vomiting: On admit, resolved at this time. 4. ESRD: Continue HD per usual MWF schedule - next HD tomorrow. 5. HTN/volume: BP higher side now. On metoprolol and mido pre-HD for intra-dialytic hypotension. 6. Anemia of ESRD: Hgb 8.7, continue Aranesp q Monday (started 6/14). Bloody stool x 1 on 6/17, unclear if has resolved? 7. Secondary hyperparathyroidism: CorrCa/Phos ok without binders or VDRA. 8. Nutrition: Alb low, on Nepro supplements. 9. Hx GBS bacteremia: Finishing 6-wk course Vanc (through 6/27). 10. Hx seizure disorder: On Keppra. 11. T2DM: Hypoglycemia this admit - on D10 drip. Per primary. 12. Hypothyroidism    Veneta Penton, PA-C 05/24/2021, 10:03 AM  [employer]

## 2021-05-24 NOTE — TOC Transition Note (Signed)
Transition of Care [facility]) - CM/SW Discharge Note   Patient Details  Name: Kirsten Mcmahon MRN: [id number] Date of Birth: 1946-01-27  [facility] CM/SW Contact:  Carles Collet, RN Phone Number: 05/24/2021, 12:19 PM   Clinical Narrative:    Thompson Caul from [facility]. [phone]. Informed that ptaient will DC today, reviewed no med changes and that patient will go to [facility] HD MWF as PTA.  Spoke w son [name], verified he had no other needs for patient for DC, and that she will go to HD MWF. Requested PTAR. PTAR set up, forms tubed to unit. Notified nurse.      Barriers to Discharge: Continued Medical Work up, Requiring sitter/restraints   Patient Goals and CMS Choice   CMS Medicare.gov Compare Post Acute Care list provided to:: Patient Represenative (must comment) Choice offered to / list presented to : Adult Children  Discharge Placement                       Discharge Plan and Services In-house Referral: Clinical Social Work                                   Social Determinants of Health (SDOH) Interventions     Readmission Risk Interventions Readmission Risk Prevention Plan 05/20/2021 05/16/2021 04/11/2021  Transportation Screening Complete Complete Complete  PCP or Specialist Appt within 3-5 Days - - Complete  HRI or [place] - - Complete  Social Work Consult for [place] Planning/Counseling - - Complete  Palliative Care Screening - - Not Applicable  Medication Review Press photographer) Complete Complete Complete  PCP or Specialist appointment within 3-5 days of discharge Complete Complete -  [place] or Home Care Consult Complete Complete -  SW Recovery Care/Counseling Consult - Complete -  Palliative Care Screening Complete Complete -  [place] Patient Refused Patient Refused -  Some recent data might be hidden

## 2021-05-24 NOTE — Progress Notes (Signed)
Pharmacy Antibiotic Note  Kirsten Mcmahon is a 75 y.o. female on Vancomycin since 04/23/21 for Group B Strep bacteremia.  6-week course planned, thru 06/01/21. Vanc 500 mg IV MWF after hemodialysis held while on Zosyn (6/11-6/16).  Discharged on 6/11 then re-admitted 6/12. Last HD session on 6/17 and next scheduled HD session 6/20 if patient remains inpatient. Stop date is in place. WBC wnl and afebrile. Vancomycin charted as given in HD flowsheet on 6/17.   Plan:  Continue Vancomycin 500 mg IV MWF after HD  Stop date 06/01/21 Monitor for changes to HD schedule   Height: 5\' 2"  (157.5 cm) Weight: 56.8 kg (125 lb 3.5 oz) IBW/kg (Calculated) : 50.1  Temp (24hrs), Avg:98.6 F (37 C), Min:98.2 F (36.8 C), Max:98.9 F (37.2 C)  Recent Labs  Lab 05/17/21 0936 05/17/21 1138 05/18/21 0112 05/18/21 1232 05/19/21 1003 05/20/21 0158 05/21/21 0043 05/21/21 1408 05/22/21 0041 05/23/21 0101 05/24/21 0116  WBC  --   --    < > 16.4*  --   --   --  7.9 8.8 9.9 9.2  CREATININE  --   --    < > 5.60* 3.61* 4.60* 2.63*  --  3.83*  --   --   LATICACIDVEN 1.8 1.4  --   --   --   --   --   --   --   --   --    < > = values in this interval not displayed.     ESRD  Allergies  Allergen Reactions   Contrast Media [Iodinated Diagnostic Agents]     Other reaction(s): NO ALLERGY   Latex     Other reaction(s): NO ALLERGY   Shellfish-Derived Products     Other reaction(s): NO ALLERGY   Levemir [Insulin Detemir] Itching    Antimicrobials this admission: Zosyn 6/12>>6/16 Vanc resuming 6/17 >> (6/27)  Previous antibiotics: Ceftriaxone 5/14 >> 5/16 Ampicillin 5/17 >>5/19 Vanc 5/19>>(6/27)  6/10 pre-HD vanc level 21 mcg/ml, on track (pre-HD goal 15-25)  Microbiology results: 6/12 Blood: ng x 4 days to date 6/12 COVID and flu: neg  5/13 blood: Strep Group G, sens Amp, CTX, LVQ, PCN, Vanc (MIC 0.5)  Thank you for allowing pharmacy to be a part of this patient's care.  Cephus Slater,  PharmD, MBA Pharmacy Resident [phone] 05/24/2021 8:04 AM

## 2021-05-24 NOTE — Discharge Summary (Signed)
Physician Discharge Summary  Kirsten Mcmahon PYP:[id number] DOB: [birth year] DOA: 05/17/2021  PCP: [facility] date: 05/17/2021 Discharge date: 05/24/2021  Admitted From: Home Disposition:  Home  Recommendations for Outpatient Follow-up:  Follow up with PCP in 1-2 weeks Please obtain BMP/CBC in one week   Home Health:Yes Equipment/Devices:None  Discharge Condition:Guarded CODE STATUS:Full Diet recommendation: Heart Healthy  Brief/Interim Summary: 75 y.o. 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  Patient in the ED he was afebrile with respiration of 25 satting greater 92% on room air White count 23,000 hemoglobin 10, chest x-ray showed no acute findings, CT of the head stable compared to previous.  MRI of the brain was significantly related to motion, showed a few untreated acute infarctions in the right temporal and parietal lobe and bilateral thalamus.  Discharge Diagnoses:  Principal Problem:   Acute metabolic encephalopathy Active Problems:   Hypertension   Hypothyroidism   Anemia in chronic kidney disease (CKD)   Type II diabetes mellitus with renal manifestations ([place])   ESRD (end stage renal disease) ([place])   Leukocytosis   Streptococcal bacteremia   Recent cerebrovascular accident (CVA)   History of CVA with residual deficit  Acute metabolic encephalopathy probably due to aspiration pneumonia: Speech evaluated the patient and recommended a dysphagia 3 diet. Ammonia level was borderline high and unlikely contributing to  her encephalopathy. 2D echo was done on 04/20/2021 that showed an EF of 24% grade 1 diastolic heart failure. No events on telemetry she was continued on aspirin. She was treated empirically with IV antibiotics and her encephalopathy resolved she was switched back to her IV vancomycin for her bacteremia. Son has unrealistic expectation about her mom's outcome, he refused to meet with [facility] care, he also refused skilled nursing facility despite physical therapy recommending strongly that she will need a skilled nursing facility.  Acute nausea and vomiting: Chest x-ray showed no acute findings, the CT of the abdomen did catch the right and lower left lung and it showed right greater than left patchy airspace disease. She has some fecal impact Tat[place]. She will start on MiraLAX p.o. twice daily and this resolved.  Sepsis question due to aspiration pneumonia: With elevated white blood cell count and fever she was treated empirically with Zosyn and she completed 5-day course of antibiotics in house.  End-stage renal disease: Renal was notified she was continued on her regular days of dialysis.  History of Streptococcus bacteremia: No changes made to his medication continue vancomycin Family declined a TEE on last admission.  Anemia of chronic disease: Her hemoglobin is stable, she did have 1 episode of small bloody bowel movements was likely diverticular hemoglobin has remained stable.  Essential hypertension: No change made to her medication.  Seizure disorder: Continue Keppra.  Non-insulin-dependent diabetes mellitus type 2: No change made to her medication.  Hypothyroidism: No change made to her medication continue Synthroid.  Elevated ammonium level: Mild no asterixis unlikely causing or contributing to patient's encephalopathy.    Discharge Instructions  Discharge Instructions     Diet - low sodium heart healthy  Complete by: As directed    Diet - low sodium heart  healthy   Complete by: As directed    Discharge wound care:   Complete by: As directed    Wound care nurse instruction   Increase activity slowly   Complete by: As directed    Increase activity slowly   Complete by: As directed    No wound care   Complete by: As directed       Allergies as of 05/24/2021       Reactions   Contrast Media [iodinated Diagnostic Agents]    Other reaction(s): NO ALLERGY   Latex    Other reaction(s): NO ALLERGY   Shellfish-derived Products    Other reaction(s): NO ALLERGY   Levemir [insulin Detemir] Itching        Medication List     TAKE these medications    aspirin EC 81 MG tablet Take 81 mg by mouth daily.   atorvastatin 40 MG tablet Commonly known as: LIPITOR Take 1 tablet (40 mg total) by mouth daily.   cholecalciferol 1000 units tablet Commonly known as: VITAMIN D Take 1,000 Units by mouth daily.   Keppra XR 500 MG 24 hr tablet Generic drug: levETIRAcetam Take 500 mg by mouth daily.   latanoprost 0.005 % ophthalmic solution Commonly known as: XALATAN Place 1 drop into both eyes at bedtime.   letrozole 2.5 MG tablet Commonly known as: FEMARA Take 1 tablet (2.5 mg total) by mouth daily.   levothyroxine 112 MCG tablet Commonly known as: SYNTHROID Take 1 tablet (112 mcg total) by mouth daily before breakfast.   metoprolol succinate 25 MG 24 hr tablet Commonly known as: TOPROL-XL Take 50 mg by mouth daily.   midodrine 5 MG tablet Commonly known as: PROAMATINE Take 1 tablet (5 mg total) by mouth every Monday, Wednesday, and Friday with hemodialysis.   vancomycin 500 MG/100ML IVPB Commonly known as: VANCOREADY Inject 100 mLs (500 mg total) into the vein every Monday, Wednesday, and Friday with hemodialysis.               Discharge Care Instructions  (From admission, onward)           Start     Ordered   05/24/21 0000  Discharge wound care:       Comments: Wound care nurse instruction   05/24/21 1043             Allergies  Allergen Reactions   Contrast Media [Iodinated Diagnostic Agents]     Other reaction(s): NO ALLERGY   Latex     Other reaction(s): NO ALLERGY   Shellfish-Derived Products     Other reaction(s): NO ALLERGY   Levemir [Insulin Detemir] Itching    Consultations: Nephrology   Procedures/Studies: CT ABDOMEN PELVIS WO CONTRAST  Result Date: 05/17/2021 CLINICAL DATA:  Altered mental status. Intra-abdominal infection. On antibiotics for bacteremia. End-stage renal disease. Vomiting. EXAM: CT ABDOMEN AND PELVIS WITHOUT CONTRAST TECHNIQUE: Multidetector CT imaging of the abdomen and pelvis was performed following the standard protocol without IV contrast. COMPARISON:  None. FINDINGS: Lower chest: Right greater than left base patchy airspace disease. Cardiomegaly. Large bore central line terminating at the low right atrium. Hepatobiliary: Multifactorial degradation, including patient arm position and lack of oral or IV contrast. Grossly normal noncontrast appearance of the liver. Multiple gallstones without acute cholecystitis or biliary duct dilatation. Pancreas: Normal, without mass or ductal dilatation. Spleen: Normal in size, without focal abnormality. Adrenals/Urinary Tract: Left greater than right adrenal thickening,  without dominant mass. Moderate bilateral renal cortical thinning. Left renal vascular calcifications. An interpolar left renal 1.0 cm low-density lesion is suspected, likely a cyst. No hydronephrosis. No hydroureter or ureteric calculi. No bladder calculi. Stomach/Bowel: Normal stomach, without wall thickening. Stool within the rectum of 6.2 cm. No obstruction. Scattered colonic diverticula. Normal terminal ileum and appendix. Normal small bowel. Vascular/Lymphatic: Aortic atherosclerosis. No abdominopelvic adenopathy. Reproductive: Hysterectomy.  No adnexal mass. Other: No significant free fluid. No free intraperitoneal air. Mild pelvic floor laxity. Multiple  soft tissue density nodules within the anterior pelvic wall are likely injection sites. Musculoskeletal: Lumbosacral spondylosis. IMPRESSION: 1. Multifactorial degradation, including patient arm position and lack of oral or IV contrast. 2. Given this factor, no acute process in the abdomen or pelvis. 3. Cholelithiasis. 4. Right greater than left base patchy airspace disease, suspicious for pneumonia. 5. Possible fecal impaction. 6. Aortic Atherosclerosis (ICD10-I70.0). Electronically Signed   By: Abigail Miyamoto M.D.   On: 05/17/2021 14:47   CT Head Wo Contrast  Result Date: 05/17/2021 CLINICAL DATA:  Mental status change with unknown cause. EXAM: CT HEAD WITHOUT CONTRAST TECHNIQUE: Contiguous axial images were obtained from the base of the skull through the vertex without intravenous contrast. COMPARISON:  05/07/2021 FINDINGS: Brain: Patchy remote bilateral cerebellar infarction. Remote right MCA distribution infarct with extensive gliosis and wallerian degeneration seen into the brainstem. Confluent small-vessel ischemic type gliosis in the cerebral white matter. No hemorrhage, hydrocephalus, or masslike finding. Vascular: No hyperdense vessel or unexpected calcification. Skull: Right craniectomy, presumably for cytotoxic edema decompression, with stable sunken appearance. Sinuses/Orbits: Negative IMPRESSION: 1. Stable compared to recent CT. Acute infarcts by brain MRI are occult by CT. 2. Extensive chronic ischemic injury. Electronically Signed   By: Monte Fantasia M.D.   On: 05/17/2021 09:25   CT HEAD WO CONTRAST  Result Date: 05/07/2021 CLINICAL DATA:  Encephalopathy EXAM: CT HEAD WITHOUT CONTRAST TECHNIQUE: Contiguous axial images were obtained from the base of the skull through the vertex without intravenous contrast. COMPARISON:  None. FINDINGS: Brain: There is no mass, hemorrhage or extra-axial collection. There is generalized atrophy without lobar predilection. Hypodensity of the white matter is most  commonly associated with chronic microvascular disease. Old right MCA territory infarct. Vascular: No abnormal hyperdensity of the major intracranial arteries or dural venous sinuses. No intracranial atherosclerosis. Skull: Remote right-sided craniectomy Sinuses/Orbits: No fluid levels or advanced mucosal thickening of the visualized paranasal sinuses. No mastoid or middle ear effusion. The orbits are normal. IMPRESSION: 1. No acute intracranial abnormality. 2. Old right MCA territory infarct and findings of chronic microvascular ischemia. Electronically Signed   By: Ulyses Jarred M.D.   On: 05/07/2021 19:47   MR ANGIO HEAD WO CONTRAST  Result Date: 05/13/2021 CLINICAL DATA:  Neuro deficit, acute, stroke suspected. EXAM: MRA HEAD WITHOUT CONTRAST TECHNIQUE: Angiographic images of the Circle of Willis were acquired using MRA technique without intravenous contrast. COMPARISON:  MRI of the brain May 12, 2021. FINDINGS: The study is severely degraded by motion. Most images are essentially nondiagnostic. Anterior circulation: Correlated identified in the intracranial internal carotid arteries and proximal M1 and A1 segments. Posterior circulation: Flow related enhancement noted in the basilar artery and bilateral vertebral arteries Other: None. IMPRESSION: 1. Severely degraded study due to motion artifact. Most images are essentially nondiagnostic. 2. Flow related enhancement seen in the intracranial internal carotid arteries, proximal bilateral M1/MCA segments and A1/ACA segments. 3. Flow related enhancement in the bilateral vertebral arteries and basilar artery. 4. Remainder of the vessels  cannot be evaluated. Electronically Signed   By: Pedro Earls M.D.   On: 05/13/2021 12:48   MR BRAIN WO CONTRAST  Result Date: 05/12/2021 CLINICAL DATA:  Encephalopathy EXAM: MRI HEAD WITHOUT CONTRAST TECHNIQUE: Multiplanar, multiecho pulse sequences of the brain and surrounding structures were obtained without  intravenous contrast. COMPARISON:  01/17/2021 FINDINGS: DWI, sagittal T1, and axial T2 sequences were attempted. There is significant motion artifact. Brain: Punctate foci of reduced diffusion are present in the right temporal lobe, right thalamus, left thalamus, and left parietal lobe. Large right MCA territory infarction. Wallerian degeneration along the right cerebral peduncle. Additional scattered smaller chronic infarcts. No significant mass effect. No hydrocephalus. Vascular: Major vessel flow voids at the skull base grossly preserved. Skull and upper cervical spine: Normal marrow signal is grossly preserved. Sinuses/Orbits: Paranasal sinuses are aerated. Orbits are grossly unremarkable. Other: None. IMPRESSION: Significantly motion degraded study. Patient could not tolerate all sequences. Few punctate acute infarcts of right temporal lobe, bilateral thalamus, and left parietal lobe. Electronically Signed   By: Macy Mis M.D.   On: 05/12/2021 18:51   IR Fluoro Guide CV Line Right  Result Date: 04/30/2021 INDICATION: Poorly functioning tunneled dialysis catheter EXAM: Tunneled dialysis catheter exchange MEDICATIONS: None ANESTHESIA/SEDATION: None FLUOROSCOPY TIME:  Fluoroscopy Time: 0 minutes 30 seconds (1 mGy). COMPLICATIONS: None immediate. PROCEDURE: Informed written consent was obtained from the patient's son after a thorough discussion of the procedural risks, benefits and alternatives. All questions were addressed. Maximal Sterile Barrier Technique was utilized including caps, mask, sterile gowns, sterile gloves, sterile drape, hand hygiene and skin antiseptic. A timeout was performed prior to the initiation of the procedure. Patient positioned supine on the procedure table. The external segment of the existing tunneled IJ catheter and surrounding skin prepped and draped usual fashion. Following local lidocaine administration, the existing catheter was removed over a 0.035 inch guidewire under  fluoroscopy. New 23 cm tunneled dialysis catheter was inserted over the guidewire. Tip positioned in the right atrium. Both lumens aspirated and flushed well and were locked with heparin. Catheter secured to skin with suture and insertion site covered with Biopatch and sterile dressing. IMPRESSION: Successful exchange of poorly functioning right IJ dialysis catheter. New 23 cm tunneled hemodialysis catheter in place and ready for use. Electronically Signed   By: Miachel Roux M.D.   On: 04/30/2021 15:59   DG Chest Port 1 View  Result Date: 05/17/2021 CLINICAL DATA:  Altered mental status. EXAM: PORTABLE CHEST 1 VIEW COMPARISON:  05/04/2021 FINDINGS: Right IJ dialysis catheter unchanged with tip over the right atrium. Patient is slightly rotated to the right. Lungs are adequately inflated and otherwise clear. Mild stable cardiomegaly. Remainder of the exam is unchanged. IMPRESSION: 1. No acute cardiopulmonary disease. 2. Mild stable cardiomegaly. Electronically Signed   By: Marin Olp M.D.   On: 05/17/2021 08:43   DG CHEST PORT 1 VIEW  Result Date: 05/04/2021 CLINICAL DATA:  Hypoxia EXAM: PORTABLE CHEST 1 VIEW COMPARISON:  Apr 28, 2021, Apr 17, 2021 FINDINGS: Evaluation is limited secondary to patient positioning. Unchanged enlarged cardiomediastinal silhouette when accounting for patient rotation. RIGHT IJ CVC tip terminates over the RIGHT atrium. No pleural effusion or pneumothorax. Surgical clips project over the neck. No acute pleuroparenchymal abnormality. Unchanged elevation of the RIGHT hemidiaphragm. Degenerative changes of the thoracic spine. IMPRESSION: No acute cardiopulmonary abnormality. Electronically Signed   By: Valentino Saxon MD   On: 05/04/2021 19:00   DG Chest Port 1 View  Result Date:  04/28/2021 CLINICAL DATA:  75 year old female with history of shortness of breath. EXAM: PORTABLE CHEST 1 VIEW COMPARISON:  Chest x-ray 04/17/2021. FINDINGS: Right internal jugular PermCath with  tips terminating in the right atrium. Lung volumes are low. No consolidative airspace disease. No pleural effusions. No pneumothorax. No pulmonary nodule or mass noted. Pulmonary vasculature and the cardiomediastinal silhouette are within normal limits. Surgical clips project over the right breast, likely from prior lumpectomy. IMPRESSION: 1. Low lung volumes without radiographic evidence of acute cardiopulmonary disease. Electronically Signed   By: Vinnie Langton M.D.   On: 04/28/2021 16:11   DG Swallowing Func-Speech Pathology  Result Date: 05/22/2021 Formatting of this result is different from the original. Objective Swallowing Evaluation: Type of Study: MBS-Modified Barium Swallow Study  Patient Details Name: PRISILA DLOUHY MRN: [id number] Date of Birth: 09/04/1946 Today's Date: 05/22/2021 Time: SLP Start Time (ACUTE ONLY): 1400 -SLP Stop Time (ACUTE ONLY): 1423 SLP Time Calculation (min) (ACUTE ONLY): 23 min Past Medical History: Past Medical History: Diagnosis Date  Cancer ([place]) 2017  Right breast  Depression   GERD (gastroesophageal reflux disease)   Glaucoma   Hemiparesis ([facility])   left side  High cholesterol   History of seizure   x 1 - after a spider bite  History of stroke with residual deficit   left-side weakness  Hypertension   states BP under control with meds., has been on med. x 2 yr.  Hypothyroidism   Non-insulin dependent type 2 diabetes mellitus ([place])   Overactive bladder   Stroke ([place])   1998 weakness on left side Past Surgical History: Past Surgical History: Procedure Laterality Date  ABDOMINAL HYSTERECTOMY    complete  BREAST BIOPSY Left 08/03/2018  Benign adipose tissue  BREAST EXCISIONAL BIOPSY Right 2014  Positive  BREAST LUMPECTOMY Right   BUBBLE STUDY  05/15/2021  Procedure: BUBBLE STUDY;  Surgeon: Jerline Pain, MD;  Location: [place] ENDOSCOPY;  Service: Cardiovascular;;  CATARACT EXTRACTION W/ INTRAOCULAR LENS IMPLANT Left   CEREBRAL ANEURYSM REPAIR  1998  DIALYSIS/PERMA CATHETER  INSERTION N/A 04/10/2021  Procedure: DIALYSIS/PERMA CATHETER INSERTION;  Surgeon: Algernon Huxley, MD;  Location: [place] CV LAB;  Service: Cardiovascular;  Laterality: N/A;  IR FLUORO GUIDE CV LINE RIGHT  04/30/2021  PICC LINE INSERTION    TEE WITHOUT CARDIOVERSION N/A 05/15/2021  Procedure: TRANSESOPHAGEAL ECHOCARDIOGRAM (TEE);  Surgeon: Jerline Pain, MD;  Location[facility] END[facility];  Service: Cardiovascular;  Laterality: N/A;  THYROID LOBECTOMY Right 07/15/2017  Procedure: RIGHT THYROID LOBECTOMY;  Surgeon: Armandina Gemma, MD;  Location: [facility];  Service: General;  Laterality: Right; HPI: Pt is a 75 y.o. female presented to ED for AMS and episode of vomiting. Per EMR, pt's son reported that shortly prior to admission he tried to feed the pt, but she started vomiting dark green material with foul odor and seemed as though she were choking on the emesis. CT head (05/17/21) with no acute findings, CXR (05/17/21) with no acute cardiopulmonary disease, CT abd (05/17/21) with patchy airspace disease R>L lung suspicious for PNA and possible fecal impaction. She was recently admitted for encephalopathy of unclear origin and acute stroke, discharged 05/16/21. PMH: ESRD on HD, HTN, HLD, CVA with residual left-sided hemiparesis, seizure disorder, hypothyroidism,  DM type II, streptococcal bacteremia on IV vancomycin with dialysis, and intermittent encephalopathy. Last BSE (04/21/21) revealed adequate oropharygeal swallow with Dysphagia 2/thin liquid diet recommended. Failed [facility] due to lethargy per RN (05/18/21).  Subjective: pt awake, verbally engaged w/ this SLP. answered basic  questions and followed instructions w/ cues. helped to feed self w/ support. Noted LUE weakness baseline d/t old stroke. Assessment / Plan / Recommendation CHL IP CLINICAL IMPRESSIONS 05/22/2021 Clinical Impression Pt presents with mild oropharyngeal dysphagia characterized by impaired bolus control, premature spillage to the pyriform sinuses, and a pharyngeal  delay which resulted in intermittent transient penetration (PAS 2) with thin liquids which is WNL. However, pt's positioning was optimal during the study with a fully-upright position and use of a pillow to facilitate pt remaining at midline. Considering her presentation at beside, SLP anticipates laryngeal invsation progresses beyond PAS 2 when pt's positioning is suboptimal due to her being reclined and/or due to her leaning to the right. Pt's current diet (dys3/thin) will be continued and SLP will follow briefly for further education. SLP Visit Diagnosis Dysphagia, oropharyngeal phase (R13.12) Attention and concentration deficit following -- Frontal lobe and executive function deficit following -- Impact on safety and function Mild aspiration risk   CH[facility] IP TREATMENT RECOMMENDATION 05/22/2021 Treatment Recommendations Therapy as outlined in treatment plan below   Prognosis 05/22/2021 Prognosis for Safe Diet Advancement Fair Barriers to Reach Goals Cognitive deficits Barriers/Prognosis Comment -- CHL IP DIET RECOMMENDATION 05/22/2021 SLP Diet Recommendations Thin liquid;Dysphagia 3 (Mech soft) solids Liquid Administration via Cup;Straw Medication Administration Crushed with puree Compensations Minimize environmental distractions;Slow rate;Small sips/bites Postural Changes Seated upright at 90 degrees   CHL IP OTHER RECOMMENDATIONS 05/22/2021 Recommended Consults -- Oral Care Recommendations Oral care BID Other Recommendations --   CH[facility] IP FOLLOW UP RECOMMENDATIONS 05/22/2021 Follow up Recommendations Skilled Nursing facility   [facility] IP FREQUENCY AND DURATION 05/22/2021 Speech Therapy Frequency (ACUTE ONLY) min 1 x/week Treatment Duration 1 week      CHL IP ORAL PHASE 05/22/2021 Oral Phase Impaired Oral - Pudding Teaspoon -- Oral - Pudding Cup -- Oral - Honey Teaspoon -- Oral - Honey Cup -- Oral - Nectar Teaspoon -- Oral - Nectar Cup -- Oral - Nectar Straw Premature spillage;Decreased bolus cohesion Oral - Thin Teaspoon --  Oral - Thin Cup Premature spillage;Decreased bolus cohesion Oral - Thin Straw Premature spillage;Decreased bolus cohesion Oral - Puree Premature spillage;Decreased bolus cohesion Oral - Mech Soft -- Oral - Regular -- Oral - Multi-Consistency -- Oral - Pill Premature spillage;Decreased bolus cohesion;Reduced posterior propulsion (pt unable to demonstrate A-P transport with barium tablet with puree/thin liquids) Oral Phase - Comment --  CHL IP PHARYNGEAL PHASE 05/22/2021 Pharyngeal Phase Impaired Pharyngeal- Pudding Teaspoon -- Pharyngeal -- Pharyngeal- Pudding Cup -- Pharyngeal -- Pharyngeal- Honey Teaspoon -- Pharyngeal -- Pharyngeal- Honey Cup -- Pharyngeal -- Pharyngeal- Nectar Teaspoon -- Pharyngeal -- Pharyngeal- Nectar Cup -- Pharyngeal -- Pharyngeal- Nectar Straw Delayed swallow initiation-pyriform sinuses Pharyngeal -- Pharyngeal- Thin Teaspoon -- Pharyngeal -- Pharyngeal- Thin Cup Delayed swallow initiation-pyriform sinuses Pharyngeal -- Pharyngeal- Thin Straw Delayed swallow initiation-pyriform sinuses Pharyngeal -- Pharyngeal- Puree Delayed swallow initiation-pyriform sinuses;Delayed swallow initiation-vallecula Pharyngeal -- Pharyngeal- Mechanical Soft -- Pharyngeal -- Pharyngeal- Regular Delayed swallow initiation-vallecula Pharyngeal -- Pharyngeal- Multi-consistency -- Pharyngeal -- Pharyngeal- Pill -- Pharyngeal -- Pharyngeal Comment --  CHL IP CERVICAL ESOPHAGEAL PHASE 05/22/2021 Cervical Esophageal Phase WFL Pudding Teaspoon -- Pudding Cup -- Honey Teaspoon -- Honey Cup -- Nectar Teaspoon -- Nectar Cup -- Nectar Straw -- Thin Teaspoon -- Thin Cup -- Thin Straw -- Puree -- Mechanical Soft -- Regular -- Multi-consistency -- Pill -- Cervical Esophageal Comment -- Shanika I. Hardin Negus, [place], [place] Office number [phone] Pager [place] 05/22/2021, 3:22 PM  EEG adult  Result Date: 05/13/2021 Lora Havens, MD     05/13/2021 11:58 AM  Patient Name: GARRY BOCHICCHIO MRN: [id number] Epilepsy Attending: Lora Havens Referring Physician/Provider: Dr Donnetta Simpers Date: 05/13/2021 Duration: 26.07 mins Patient history: 75 year old female with altered mental status.  EEG to evaluate for seizures. Level of alertness: Awake AEDs during EEG study: Keppra Technical aspects: This EEG study was done with scalp electrodes positioned according to the 10-20 International system of electrode placement. Electrical activity was acquired at a sampling rate of 500Hz  and reviewed with a high frequency filter of 70Hz  and a low frequency filter of 1Hz . EEG data were recorded continuously and digitally stored. Description:  The posterior dominant rhythm consists of 8 Hz activity of moderate voltage (25-35 uV) seen predominantly in posterior head regions, symmetric and reactive to eye opening and eye closing.  EEG showed continuous generalized 3 to 5 Hz theta-delta slowing.  There is also sharply contoured 3 to 5 Hz theta-delta slowing in right frontal region consistent with underlying breech artifact.  Sharp transients were seen right parietal region. Hyperventilation and photic stimulation were not performed.   ABNORMALITY -Continuous slow, generalized - Breach artifact, right frontal region IMPRESSION: This study is suggestive of cortical dysfunction in right frontal region consistent with prior craniotomy.  Additionally there is evidence of moderate diffuse encephalopathy, nonspecific etiology.  No seizures or definite epileptiform discharges were seen throughout the recording. Lora Havens   ECHO TEE  Result Date: 05/15/2021    TRANSESOPHOGEAL ECHO REPORT   Patient Name:   JACARRA BOBAK Date of Exam: 05/15/2021 Medical Rec #:  [id number]         Height:       62.0 in Accession #:    [id number]        Weight:       124.6 lb Date of Birth:  [birth year]          BSA:          1.563 m Patient Age:    23 years          BP:           159/64 mmHg Patient  Gender: F                 HR:           93 bpm. Exam Location:  Inpatient Procedure: 2D Echo, Color Doppler and Saline Contrast Bubble Study Indications:     Bacteremia. CVA.  History:         Patient has prior history of Echocardiogram examinations, most                  recent 04/20/2021.  Sonographer:     Philipp Deputy Referring Phys:  [id number] HAO MENG Diagnosing Phys: Candee Furbish MD PROCEDURE: After discussion of the risks and benefits of a TEE, an informed consent was obtained from the patient. The transesophogeal probe was passed without difficulty through the esophogus of the patient. Imaged were obtained with the patient in a left lateral decubitus position. Local oropharyngeal anesthetic was provided with viscous lidocaine. Sedation performed by different physician. Image quality was adequate. The patient developed no complications during the procedure. IMPRESSIONS  1. +PFO, positive bubble study.  2. Left ventricular ejection fraction, by estimation, is 65 to 70%. The left ventricle has normal function.  3. Right ventricular systolic function is normal. The right ventricular size is normal.  4. No left atrial/left atrial appendage  thrombus was detected.  5. The mitral valve is normal in structure. No evidence of mitral valve regurgitation. No evidence of mitral stenosis.  6. The aortic valve is tricuspid. Aortic valve regurgitation is mild. Mild aortic valve sclerosis is present, with no evidence of aortic valve stenosis.  7. Evidence of atrial level shunting detected by color flow Doppler. Agitated saline contrast bubble study was positive with shunting observed within 3-6 cardiac cycles suggestive of interatrial shunt. There is a small patent foramen ovale with predominantly right to left shunting across the atrial septum. Conclusion(s)/Recommendation(s): No evidence of vegetation/infective endocarditis on this transesophageal echocardiogram. +PFO, positive bubble study.  FINDINGS  Left Ventricle: Left  ventricular ejection fraction, by estimation, is 65 to 70%. The left ventricle has normal function. The left ventricular internal cavity size was small. Right Ventricle: The right ventricular size is normal. No increase in right ventricular wall thickness. Right ventricular systolic function is normal. Left Atrium: Left atrial size was normal in size. No left atrial/left atrial appendage thrombus was detected. Right Atrium: Right atrial size was normal in size. Pericardium: There is no evidence of pericardial effusion. Mitral Valve: The mitral valve is normal in structure. No evidence of mitral valve regurgitation. No evidence of mitral valve stenosis. Tricuspid Valve: The tricuspid valve is normal in structure. Tricuspid valve regurgitation is mild . No evidence of tricuspid stenosis. Aortic Valve: The aortic valve is tricuspid. Aortic valve regurgitation is mild. Mild aortic valve sclerosis is present, with no evidence of aortic valve stenosis. Pulmonic Valve: The pulmonic valve was normal in structure. Pulmonic valve regurgitation is not visualized. Aorta: The aortic root was not well visualized. Venous: The pulmonary veins were not well visualized. IAS/Shunts: Evidence of atrial level shunting detected by color flow Doppler. Agitated saline contrast was given intravenously to evaluate for intracardiac shunting. Agitated saline contrast bubble study was positive with shunting observed within 3-6 cardiac cycles suggestive of interatrial shunt. A small patent foramen ovale is detected with predominantly right to left shunting across the atrial septum. Additional Comments: +PFO, positive bubble study. Candee Furbish MD Electronically signed by Candee Furbish MD Signature Date/Time: 05/15/2021/12:24:43 PM    Final    VAS US CAROTID  Result Date: 05/15/2021 Carotid Arterial Duplex Study Patient Name:  [name]  Date of Exam:   05/14/2021 Medical Rec #: [id number]          Accession #:    [id number] Date of Birth:  04-Jun-1946           Patient Gender: F Patient Age:   74Y Exam Location:  [facility] Procedure:      VAS US CAROTID Referring Phys: [id number] [place] --------------------------------------------------------------------------------  Indications:       CVA. Comparison Study:  No prior study Performing Technologist: Maudry Mayhew MHA, RDMS, RVT, RDCS  Examination Guidelines: A complete evaluation includes B-mode imaging, spectral Doppler, color Doppler, and power Doppler as needed of all accessible portions of each vessel. Bilateral testing is considered an integral part of a complete examination. Limited examinations for reoccurring indications may be performed as noted.  Right Carotid Findings: +----------+--------+--------+--------+------------------+--------+           PSV cm/sEDV cm/sStenosisPlaque DescriptionComments +----------+--------+--------+--------+------------------+--------+ CCA Prox  51      17                                         +----------+--------+--------+--------+------------------+--------+  CCA Distal43      10                                         +----------+--------+--------+--------+------------------+--------+ ICA Prox  42      14                                         +----------+--------+--------+--------+------------------+--------+ ECA       36      8                                          +----------+--------+--------+--------+------------------+--------+ +---------+--------+--------+---------+ VertebralPSV cm/sEDV cm/sAntegrade +---------+--------+--------+---------+  Left Carotid Findings: +----------+--------+--------+--------+-----------------------+--------+           PSV cm/sEDV cm/sStenosisPlaque Description     Comments +----------+--------+--------+--------+-----------------------+--------+ CCA Prox  41      11                                               +----------+--------+--------+--------+-----------------------+--------+ CCA Distal36      11                                              +----------+--------+--------+--------+-----------------------+--------+ ICA Prox  33      17                                              +----------+--------+--------+--------+-----------------------+--------+ ICA Distal44      12                                              +----------+--------+--------+--------+-----------------------+--------+ ECA       41      10              smooth and heterogenous         +----------+--------+--------+--------+-----------------------+--------+ +----------+--------+--------+----------------+-------------------+           PSV cm/sEDV cm/sDescribe        Arm Pressure (mmHG) +----------+--------+--------+----------------+-------------------+ [id number]              Multiphasic, WNL                    +----------+--------+--------+----------------+-------------------+ +---------+--------+--+--------+--+---------+ VertebralPSV cm/s52EDV cm/s18Antegrade +---------+--------+--+--------+--+---------+   Summary: Right Carotid: Velocities in the right ICA are consistent with a 1-39% stenosis. Left Carotid: Velocities in the left ICA are consistent with a 1-39% stenosis. Vertebrals:  Left vertebral artery demonstrates antegrade flow. Subclavians: Normal flow hemodynamics were seen in the left subclavian artery. *See table(s) above for measurements and observations.  Electronically signed by Antony Contras MD on 05/15/2021 at 11:57:13 AM.    Final    VAS [place] LOWER EXTREMITY VENOUS (DVT)  Result Date: 05/16/2021  Lower Venous DVT  Study Patient Name:  ARUSHI PARTRIDGE  Date of Exam:   05/16/2021 Medical Rec #: [id number]          Accession #:    [id number] Date of Birth: February 15, 1946           Patient Gender: F Patient Age:   65Y Exam Location:  [facility] Procedure:      VAS [place] LOWER EXTREMITY  VENOUS (DVT) Referring Phys: [id number] PRAMOD S SETHI --------------------------------------------------------------------------------  Indications: Stroke, and Positive PFO by TEE.  Limitations: Patient in reclining chair. Altered mental status. Comparison Study: Prior negative Left LEV done 04/10/21 and 12/26/16, are available                   for comparison Performing Technologist: Sharion Dove RVS  Examination Guidelines: A complete evaluation includes B-mode imaging, spectral Doppler, color Doppler, and power Doppler as needed of all accessible portions of each vessel. Bilateral testing is considered an integral part of a complete examination. Limited examinations for reoccurring indications may be performed as noted. The reflux portion of the exam is performed with the patient in reverse Trendelenburg.  +---------+---------------+---------+-----------+----------+-------------------+ RIGHT    CompressibilityPhasicitySpontaneityPropertiesThrombus Aging      +---------+---------------+---------+-----------+----------+-------------------+ CFV      Full           Yes      Yes                                      +---------+---------------+---------+-----------+----------+-------------------+ SFJ      Full                                                             +---------+---------------+---------+-----------+----------+-------------------+ FV Prox  Full                                                             +---------+---------------+---------+-----------+----------+-------------------+ FV Mid   Full                                                             +---------+---------------+---------+-----------+----------+-------------------+ FV DistalFull                                                             +---------+---------------+---------+-----------+----------+-------------------+ PFV                     Yes      Yes                                       +---------+---------------+---------+-----------+----------+-------------------+  POP      Full           Yes      Yes                                      +---------+---------------+---------+-----------+----------+-------------------+ PTV      Full                                                             +---------+---------------+---------+-----------+----------+-------------------+ PERO                                                  Not well visualized +---------+---------------+---------+-----------+----------+-------------------+   +---------+---------------+---------+-----------+----------+-------------------+ LEFT     CompressibilityPhasicitySpontaneityPropertiesThrombus Aging      +---------+---------------+---------+-----------+----------+-------------------+ CFV                     Yes      Yes                  patent by color and                                                       Doppler             +---------+---------------+---------+-----------+----------+-------------------+ FV Prox                 Yes      Yes                  patent by color and                                                       Doppler             +---------+---------------+---------+-----------+----------+-------------------+ FV Mid                  Yes      Yes                  patent by color and                                                       Doppler             +---------+---------------+---------+-----------+----------+-------------------+ FV Distal               Yes      Yes                  patent by color and  Doppler             +---------+---------------+---------+-----------+----------+-------------------+ PFV                                                   Not well visualized +---------+---------------+---------+-----------+----------+-------------------+  POP                     Yes      Yes                  patent by color and                                                       Doppler             +---------+---------------+---------+-----------+----------+-------------------+ PTV                                                   Not well visualized +---------+---------------+---------+-----------+----------+-------------------+ PERO                                                  Not well visualized +---------+---------------+---------+-----------+----------+-------------------+     Summary: RIGHT: - There is no evidence of deep vein thrombosis in the lower extremity. However, portions of this examination were limited- see technologist comments above.  LEFT: - There is no evidence of deep vein thrombosis in the lower extremity. However, portions of this examination were limited- see technologist comments above.  *See table(s) above for measurements and observations. Electronically signed by Jamelle Haring on 05/16/2021 at 7:17:31 PM.    Final    (Echo, Carotid, EGD, Colonoscopy, ERCP)    Subjective: No complaints  Discharge Exam: Vitals:   05/24/21 0353 05/24/21 0750  BP: (!) 162/95 133/88  Pulse: 95 84  Resp: 16 19  Temp: 98.9 F (37.2 C) 98.9 F (37.2 C)  SpO2: 100% 99%   Vitals:   05/23/21 2331 05/24/21 0353 05/24/21 0439 05/24/21 0750  BP: (!) 156/83 (!) 162/95  133/88  Pulse: 93 95  84  Resp: 20 16  19   Temp: 98.5 F (36.9 C) 98.9 F (37.2 C)  98.9 F (37.2 C)  TempSrc: Oral   Oral  SpO2: 100% 100%  99%  Weight:   56.8 kg   Height:        General: Pt is alert, awake, not in acute distress Cardiovascular: RRR, S1/S2 +, no rubs, no gallops Respiratory: CTA bilaterally, no wheezing, no rhonchi Abdominal: Soft, NT, ND, bowel sounds + Extremities: no edema, no cyanosis    The results of significant diagnostics from this hospitalization (including imaging, microbiology, ancillary and laboratory)  are listed below for reference.     Microbiology: Recent Results (from the past 240 hour(s))  Culture, blood (routine x 2)     Status: None   Collection Time: 05/17/21  9:48 AM   Specimen: BLOOD LEFT FOREARM  Result  Value Ref Range Status   Specimen Description BLOOD LEFT FOREARM  Final   Special Requests   Final    BOTTLES DRAWN AEROBIC AND ANAEROBIC Blood Culture adequate volume   Culture   Final    NO GROWTH 5 DAYS Performed at [facility], [address] [place], [place] [address]    Report Status 05/22/2021 FINAL  Final  Culture, blood (routine x 2)     Status: None   Collection Time: 05/17/21  9:53 AM   Specimen: BLOOD LEFT FOREARM  Result Value Ref Range Status   Specimen Description BLOOD LEFT FOREARM  Final   Special Requests   Final    BOTTLES DRAWN AEROBIC AND ANAEROBIC Blood Culture adequate volume   Culture   Final    NO GROWTH 5 DAYS Performed at [facility], [place] [address] [place], [place] [address]    Report Status 05/22/2021 FINAL  Final  Resp Panel by RT-PCR (Flu A&B, Covid) Nasopharyngeal Swab     Status: None   Collection Time: 05/17/21 11:28 AM   Specimen: Nasopharyngeal Swab; Nasopharyngeal(NP) swabs in vial transport medium  Result Value Ref Range Status   SARS Coronavirus 2 by RT PCR NEGATIVE NEGATIVE Final    Comment: (NOTE) SARS-CoV-2 target nucleic acids are NOT DETECTED.  The SARS-CoV-2 RNA is generally detectable in upper respiratory specimens during the acute phase of infection. The lowest concentration of SARS-CoV-2 viral copies this assay can detect is 138 copies/mL. A negative result does not preclude SARS-Cov-2 infection and should not be used as the sole basis for treatment or other patient management decisions. A negative result may occur with  improper specimen collection/handling, submission of specimen other than nasopharyngeal swab, presence of viral mutation(s) within the areas targeted by this assay, and inadequate  number of viral copies(<138 copies/mL). A negative result must be combined with clinical observations, patient history, and epidemiological information. The expected result is Negative.  Fact Sheet for Patients:  EntrepreneurPulse.com.au  Fact Sheet for Healthcare Providers:  IncredibleEmployment.be  This test is no t yet approved or cleared by the [place] FDA and  has been authorized for detection and/or diagnosis of SARS-CoV-2 by FDA under an Emergency Use Authorization (EUA). This EUA will remain  in effect (meaning this test can be used) for the duration of the COVID-19 declaration under Section 564(b)(1) of the Act, 21 U.S.C.section 360bbb-3(b)(1), unless the authorization is terminated  or revoked sooner.       Influenza A by PCR NEGATIVE NEGATIVE Final   Influenza B by PCR NEGATIVE NEGATIVE Final    Comment: (NOTE) The Xpert Xpress SARS-CoV-2/FLU/RSV plus assay is intended as an aid in the diagnosis of influenza from Nasopharyngeal swab specimens and should not be used as a sole basis for treatment. Nasal washings and aspirates are unacceptable for Xpert Xpress SARS-CoV-2/FLU/RSV testing.  Fact Sheet for Patients: EntrepreneurPulse.com.au  Fact Sheet for Healthcare Providers: IncredibleEmployment.be  This test is not yet approved or cleared by the [place] FDA and has been authorized for detection and/or diagnosis of SARS-CoV-2 by FDA under an Emergency Use Authorization (EUA). This EUA will remain in effect (meaning this test can be used) for the duration of the COVID-19 declaration under Section 564(b)(1) of the Act, 21 U.S.C. section 360bbb-3(b)(1), unless the authorization is terminated or revoked.  Performed at [facility], [place] [address], [place], [place] [address]      Labs: BNP (last 3 results) Recent Labs  04/09/21 1545  BNP 462.7*   Basic Metabolic  Panel: Recent Labs  Lab 05/18/21 1232 05/19/21 1003 05/20/21 0158 05/21/21 0043 05/22/21 0041  NA 143 135 137 135 135  K 4.3 4.7 3.4*  3.4* 3.5 3.9  CL 104 100 102 100 100  CO2 25 20* 23 26 23   GLUCOSE 129* 98 104* 95 151*  BUN 39* 26* 38* 13 21  CREATININE 5.60* 3.61* 4.60* 2.63* 3.83*  CALCIUM 8.8* 7.8* 7.7* 7.5* 7.9*  MG  --   --  1.8  --   --   PHOS 4.9* 2.8 3.3 2.0* 3.2   Liver Function Tests: Recent Labs  Lab 05/18/21 1232 05/19/21 1003 05/20/21 0158 05/21/21 0043 05/22/21 0041  ALBUMIN 2.9* 2.9* 2.4* 2.2* 2.2*   No results for input(s): LIPASE, AMYLASE in the last 168 hours. No results for input(s): AMMONIA in the last 168 hours. CBC: Recent Labs  Lab 05/18/21 1232 05/21/21 1408 05/22/21 0041 05/23/21 0101 05/24/21 0116  WBC 16.4* 7.9 8.8 9.9 9.2  NEUTROABS  --  4.9  --   --   --   HGB 9.0* 7.7* 8.0* 7.8* 8.7*  HCT 28.6* 24.0* 24.3* 24.7* 26.2*  MCV 94.1 90.9 91.0 92.5 91.0  PLT 255 251 205 276 301   Cardiac Enzymes: No results for input(s): CKTOTAL, CKMB, CKMBINDEX, TROPONINI in the last 168 hours. BNP: Invalid input(s): POCBNP CBG: Recent Labs  Lab 05/23/21 1615 05/23/21 2000 05/23/21 2324 05/24/21 0351 05/24/21 0757  GLUCAP 122* 166* 129* 121* 120*   D-Dimer No results for input(s): DDIMER in the last 72 hours. Hgb A1c No results for input(s): HGBA1C in the last 72 hours. Lipid Profile No results for input(s): CHOL, HDL, LDLCALC, TRIG, CHOLHDL, LDLDIRECT in the last 72 hours. Thyroid function studies No results for input(s): TSH, T4TOTAL, T3FREE, THYROIDAB in the last 72 hours.  Invalid input(s): FREET3 Anemia work up No results for input(s): VITAMINB12, FOLATE, FERRITIN, TIBC, IRON, RETICCTPCT in the last 72 hours. Urinalysis    Component Value Date/Time   COLORURINE YELLOW 05/17/2021 0808   APPEARANCEUR CLEAR 05/17/2021 0808   LABSPEC 1.011 05/17/2021 0808   PHURINE 9.0 (H) 05/17/2021 0808   GLUCOSEU 50 (A) 05/17/2021 0808    HGBUR NEGATIVE 05/17/2021 0808   BILIRUBINUR NEGATIVE 05/17/2021 0808   KETONESUR 5 (A) 05/17/2021 0808   PROTEINUR >=300 (A) 05/17/2021 0808   NITRITE NEGATIVE 05/17/2021 0808   LEUKOCYTESUR NEGATIVE 05/17/2021 0808   Sepsis Labs Invalid input(s): PROCALCITONIN,  WBC,  LACTICIDVEN Microbiology Recent Results (from the past 240 hour(s))  Culture, blood (routine x 2)     Status: None   Collection Time: 05/17/21  9:48 AM   Specimen: BLOOD LEFT FOREARM  Result Value Ref Range Status   Specimen Description BLOOD LEFT FOREARM  Final   Special Requests   Final    BOTTLES DRAWN AEROBIC AND ANAEROBIC Blood Culture adequate volume   Culture   Final    NO GROWTH 5 DAYS Performed at [facility], [place] [address], [place], [place] [address]    Report Status 05/22/2021 FINAL  Final  Culture, blood (routine x 2)     Status: None   Collection Time: 05/17/21  9:53 AM   Specimen: BLOOD LEFT FOREARM  Result Value Ref Range Status   Specimen Description BLOOD LEFT FOREARM  Final   Special Requests   Final    BOTTLES DRAWN AEROBIC AND ANAEROBIC Blood Culture adequate volume   Culture   Final  NO GROWTH 5 DAYS Performed at [facility], [place] [address], [place], [place] [address]    Report Status 05/22/2021 FINAL  Final  Resp Panel by RT-PCR (Flu A&B, Covid) Nasopharyngeal Swab     Status: None   Collection Time: 05/17/21 11:28 AM   Specimen: Nasopharyngeal Swab; Nasopharyngeal(NP) swabs in vial transport medium  Result Value Ref Range Status   SARS Coronavirus 2 by RT PCR NEGATIVE NEGATIVE Final    Comment: (NOTE) SARS-CoV-2 target nucleic acids are NOT DETECTED.  The SARS-CoV-2 RNA is generally detectable in upper respiratory specimens during the acute phase of infection. The lowest concentration of SARS-CoV-2 viral copies this assay can detect is 138 copies/mL. A negative result does not preclude SARS-Cov-2 infection and should not be used as the sole basis for treatment  or other patient management decisions. A negative result may occur with  improper specimen collection/handling, submission of specimen other than nasopharyngeal swab, presence of viral mutation(s) within the areas targeted by this assay, and inadequate number of viral copies(<138 copies/mL). A negative result must be combined with clinical observations, patient history, and epidemiological information. The expected result is Negative.  Fact Sheet for Patients:  EntrepreneurPulse.com.au  Fact Sheet for Healthcare Providers:  IncredibleEmployment.be  This test is no t yet approved or cleared by the [place] FDA and  has been authorized for detection and/or diagnosis of SARS-CoV-2 by FDA under an Emergency Use Authorization (EUA). This EUA will remain  in effect (meaning this test can be used) for the duration of the COVID-19 declaration under Section 564(b)(1) of the Act, 21 U.S.C.section 360bbb-3(b)(1), unless the authorization is terminated  or revoked sooner.       Influenza A by PCR NEGATIVE NEGATIVE Final   Influenza B by PCR NEGATIVE NEGATIVE Final    Comment: (NOTE) The Xpert Xpress SARS-CoV-2/FLU/RSV plus assay is intended as an aid in the diagnosis of influenza from Nasopharyngeal swab specimens and should not be used as a sole basis for treatment. Nasal washings and aspirates are unacceptable for Xpert Xpress SARS-CoV-2/FLU/RSV testing.  Fact Sheet for Patients: EntrepreneurPulse.com.au  Fact Sheet for Healthcare Providers: IncredibleEmployment.be  This test is not yet approved or cleared by the [place] FDA and has been authorized for detection and/or diagnosis of SARS-CoV-2 by FDA under an Emergency Use Authorization (EUA). This EUA will remain in effect (meaning this test can be used) for the duration of the COVID-19 declaration under Section 564(b)(1) of the Act, 21 U.S.C. section  360bbb-3(b)(1), unless the authorization is terminated or revoked.  Performed at [facility], [place] [address], [place], [place] [id number]      Time coordinating discharge: Over 30 minutes  SIGNED:   Charlynne Cousins, MD  [facility] 05/24/2021, 10:44 AM Pager   If 7PM-7AM, please contact night-coverage [email or web address] Password [email or web address]

## 2021-05-26 DIAGNOSIS — J188 Other pneumonia, unspecified organism: Secondary | ICD-10-CM | POA: Insufficient documentation

## 2021-05-27 NOTE — TOC Transition Note (Signed)
Transition of care contact from inpatient facility  Date of discharge: 05/24/21 Date of contact: 05/25/21 Method: Phone Spoke to: Patient  Attempted to call patient via phone to discuss recent hospitalization but patient's phone continuous have a "dial" tone. Called multiple times with dial tone only.   Plan for [facility] provider to follow up with patient during next scheduled HD day.    Tobie Poet, NP

## 2021-05-28 ENCOUNTER — Other Ambulatory Visit: Payer: Self-pay | Admitting: *Deleted

## 2021-05-28 NOTE — Patient Outreach (Addendum)
[facility]) Care Management  05/28/2021  JERALINE MARCINEK 1946/05/31 [id number]   RED ON EMMI ALERT - Stroke Day # 9 Date: 6/22 Red Alert Reason: Lost interest in things   Outreach attempt #1, unsuccessful, HIPAA compliant voice message left.  Plan: RN CM will send outreach letter and follow up within the next 3-4 business days.   UPDATE:  Incoming call received back from son, Identity verified.  This care manager introduced self and stated purpose of call.  [facility] care management services explained.    He report member lives with him and his girlfriend.  She is requiring help with all ADL's with the exception of eating, able to feed self.  State he does not feel member has lost interest in things, this answer was a mistake as he was trying to hang the phone up.  He does voice concern about member needing follow up appointment and PT services. Report member is active with [facility] in [place].  Denies any questions about medication management.  Call placed to [facility], spoke with Crystal, she confirms that member has had 2 provider visits since being discharged on Monday.  One was in the home and one was at the clinic today.  Member has been evaluated for PT/OT services and they will meet as a team to discuss plan of care.  She report son has been informed of this and should await call back.  Call placed back to son to remind him that member has already had follow up appointment and therapy assessment.  Verbalizes understanding, denies any further questions.  Will close case at this time, no further needs identified as [facility] is covering member.  Valente David, [place], MSN [place] [phone]

## 2021-06-02 DIAGNOSIS — E876 Hypokalemia: Secondary | ICD-10-CM | POA: Insufficient documentation

## 2021-07-13 DIAGNOSIS — R52 Pain, unspecified: Secondary | ICD-10-CM | POA: Insufficient documentation

## 2021-07-14 DIAGNOSIS — Z23 Encounter for immunization: Secondary | ICD-10-CM | POA: Insufficient documentation

## 2021-07-15 ENCOUNTER — Other Ambulatory Visit: Payer: Self-pay

## 2021-07-15 ENCOUNTER — Encounter (HOSPITAL_COMMUNITY): Payer: Self-pay | Admitting: Emergency Medicine

## 2021-07-15 ENCOUNTER — Inpatient Hospital Stay (HOSPITAL_COMMUNITY)
Admission: EM | Admit: 2021-07-15 | Discharge: 2021-07-31 | DRG: 100 | Disposition: A | Discharge status: Home / Self Care | Payer: Medicare (Managed Care) | Source: Ambulatory Visit | Attending: Internal Medicine | Admitting: Internal Medicine

## 2021-07-15 DIAGNOSIS — Z79811 Long term (current) use of aromatase inhibitors: Secondary | ICD-10-CM

## 2021-07-15 DIAGNOSIS — L89322 Pressure ulcer of left buttock, stage 2: Secondary | ICD-10-CM | POA: Diagnosis present

## 2021-07-15 DIAGNOSIS — Z170 Estrogen receptor positive status [ER+]: Secondary | ICD-10-CM

## 2021-07-15 DIAGNOSIS — G40919 Epilepsy, unspecified, intractable, without status epilepticus: Secondary | ICD-10-CM | POA: Diagnosis not present

## 2021-07-15 DIAGNOSIS — E039 Hypothyroidism, unspecified: Secondary | ICD-10-CM | POA: Diagnosis present

## 2021-07-15 DIAGNOSIS — I10 Essential (primary) hypertension: Secondary | ICD-10-CM | POA: Diagnosis present

## 2021-07-15 DIAGNOSIS — Z91041 Radiographic dye allergy status: Secondary | ICD-10-CM

## 2021-07-15 DIAGNOSIS — Z90710 Acquired absence of both cervix and uterus: Secondary | ICD-10-CM

## 2021-07-15 DIAGNOSIS — R4182 Altered mental status, unspecified: Secondary | ICD-10-CM

## 2021-07-15 DIAGNOSIS — Z7982 Long term (current) use of aspirin: Secondary | ICD-10-CM

## 2021-07-15 DIAGNOSIS — R54 Age-related physical debility: Secondary | ICD-10-CM | POA: Diagnosis present

## 2021-07-15 DIAGNOSIS — C50411 Malignant neoplasm of upper-outer quadrant of right female breast: Secondary | ICD-10-CM | POA: Diagnosis present

## 2021-07-15 DIAGNOSIS — G40909 Epilepsy, unspecified, not intractable, without status epilepticus: Secondary | ICD-10-CM | POA: Diagnosis not present

## 2021-07-15 DIAGNOSIS — E11649 Type 2 diabetes mellitus with hypoglycemia without coma: Secondary | ICD-10-CM | POA: Diagnosis not present

## 2021-07-15 DIAGNOSIS — E1122 Type 2 diabetes mellitus with diabetic chronic kidney disease: Secondary | ICD-10-CM

## 2021-07-15 DIAGNOSIS — R410 Disorientation, unspecified: Secondary | ICD-10-CM

## 2021-07-15 DIAGNOSIS — I69354 Hemiplegia and hemiparesis following cerebral infarction affecting left non-dominant side: Secondary | ICD-10-CM

## 2021-07-15 DIAGNOSIS — Z8673 Personal history of transient ischemic attack (TIA), and cerebral infarction without residual deficits: Secondary | ICD-10-CM

## 2021-07-15 DIAGNOSIS — G9341 Metabolic encephalopathy: Secondary | ICD-10-CM | POA: Diagnosis present

## 2021-07-15 DIAGNOSIS — Z79890 Hormone replacement therapy (postmenopausal): Secondary | ICD-10-CM

## 2021-07-15 DIAGNOSIS — Z20822 Contact with and (suspected) exposure to covid-19: Secondary | ICD-10-CM | POA: Diagnosis present

## 2021-07-15 DIAGNOSIS — Z79899 Other long term (current) drug therapy: Secondary | ICD-10-CM

## 2021-07-15 DIAGNOSIS — R569 Unspecified convulsions: Secondary | ICD-10-CM

## 2021-07-15 DIAGNOSIS — I120 Hypertensive chronic kidney disease with stage 5 chronic kidney disease or end stage renal disease: Secondary | ICD-10-CM | POA: Diagnosis present

## 2021-07-15 DIAGNOSIS — Z91013 Allergy to seafood: Secondary | ICD-10-CM

## 2021-07-15 DIAGNOSIS — N186 End stage renal disease: Secondary | ICD-10-CM

## 2021-07-15 DIAGNOSIS — E782 Mixed hyperlipidemia: Secondary | ICD-10-CM | POA: Diagnosis present

## 2021-07-15 DIAGNOSIS — F0390 Unspecified dementia without behavioral disturbance: Secondary | ICD-10-CM | POA: Diagnosis present

## 2021-07-15 DIAGNOSIS — Z888 Allergy status to other drugs, medicaments and biological substances status: Secondary | ICD-10-CM

## 2021-07-15 DIAGNOSIS — N2581 Secondary hyperparathyroidism of renal origin: Secondary | ICD-10-CM | POA: Diagnosis present

## 2021-07-15 DIAGNOSIS — L89312 Pressure ulcer of right buttock, stage 2: Secondary | ICD-10-CM | POA: Diagnosis present

## 2021-07-15 DIAGNOSIS — H409 Unspecified glaucoma: Secondary | ICD-10-CM | POA: Diagnosis present

## 2021-07-15 DIAGNOSIS — E1169 Type 2 diabetes mellitus with other specified complication: Secondary | ICD-10-CM | POA: Diagnosis present

## 2021-07-15 DIAGNOSIS — D631 Anemia in chronic kidney disease: Secondary | ICD-10-CM | POA: Diagnosis present

## 2021-07-15 DIAGNOSIS — F05 Delirium due to known physiological condition: Secondary | ICD-10-CM | POA: Diagnosis present

## 2021-07-15 DIAGNOSIS — Z91040 Latex allergy status: Secondary | ICD-10-CM

## 2021-07-15 DIAGNOSIS — Z992 Dependence on renal dialysis: Secondary | ICD-10-CM

## 2021-07-15 LAB — TSH: TSH: 0.429 u[IU]/mL (ref 0.350–4.500)

## 2021-07-15 LAB — BASIC METABOLIC PANEL
Anion gap: 9 (ref 5–15)
BUN: 10 mg/dL (ref 8–23)
CO2: 26 mmol/L (ref 22–32)
Calcium: 8.4 mg/dL — ABNORMAL LOW (ref 8.9–10.3)
Chloride: 102 mmol/L (ref 98–111)
Creatinine, Ser: 2.61 mg/dL — ABNORMAL HIGH (ref 0.44–1.00)
GFR, Estimated: 18 mL/min — ABNORMAL LOW (ref >60)
Glucose, Bld: 109 mg/dL — ABNORMAL HIGH (ref 70–99)
Potassium: 3.5 mmol/L (ref 3.5–5.1)
Sodium: 137 mmol/L (ref 135–145)

## 2021-07-15 LAB — CBC WITH DIFFERENTIAL/PLATELET
Abs Immature Granulocytes: 0.07 10*3/uL (ref 0.00–0.07)
Basophils Absolute: 0 10*3/uL (ref 0.0–0.1)
Basophils Relative: 0 %
Eosinophils Absolute: 0.1 10*3/uL (ref 0.0–0.5)
Eosinophils Relative: 1 %
HCT: 32.1 % — ABNORMAL LOW (ref 36.0–46.0)
Hemoglobin: 9.8 g/dL — ABNORMAL LOW (ref 12.0–15.0)
Immature Granulocytes: 1 %
Lymphocytes Relative: 11 %
Lymphs Abs: 0.9 10*3/uL (ref 0.7–4.0)
MCH: 29.3 pg (ref 26.0–34.0)
MCHC: 30.5 g/dL (ref 30.0–36.0)
MCV: 95.8 fL (ref 80.0–100.0)
Monocytes Absolute: 0.6 10*3/uL (ref 0.1–1.0)
Monocytes Relative: 7 %
Neutro Abs: 7 10*3/uL (ref 1.7–7.7)
Neutrophils Relative %: 80 %
Platelets: 156 10*3/uL (ref 150–400)
RBC: 3.35 MIL/uL — ABNORMAL LOW (ref 3.87–5.11)
RDW: 15.8 % — ABNORMAL HIGH (ref 11.5–15.5)
WBC: 8.6 10*3/uL (ref 4.0–10.5)
nRBC: 0 % (ref 0.0–0.2)

## 2021-07-15 LAB — RESP PANEL BY RT-PCR (FLU A&B, COVID) ARPGX2
Influenza A by PCR: NEGATIVE
Influenza B by PCR: NEGATIVE
SARS Coronavirus 2 by RT PCR: NEGATIVE

## 2021-07-15 LAB — T4, FREE: Free T4: 1.37 ng/dL — ABNORMAL HIGH (ref 0.61–1.12)

## 2021-07-15 MED ORDER — LEVETIRACETAM 250 MG PO TABS: 250.0000 mg | ORAL_TABLET | ORAL | Status: DC

## 2021-07-15 MED ORDER — LEVETIRACETAM IN NACL 1500 MG/100ML IV SOLN
1500.0000 mg | Freq: Once | INTRAVENOUS | Status: AC
  Administered 2021-07-15: 1500 mg via INTRAVENOUS
  Filled 2021-07-15: qty 100

## 2021-07-15 MED ORDER — LEVETIRACETAM 500 MG PO TABS
500.0000 mg | ORAL_TABLET | Freq: Every day | ORAL | Status: DC
  Filled 2021-07-15 (×2): qty 1

## 2021-07-15 NOTE — ED Provider Notes (Signed)
[place] EMERGENCY DEPARTMENT Provider Note   CSN: [id number] Arrival date & time: 07/15/21  1650     History No chief complaint on file.   Kirsten Mcmahon is a 75 y.o. female.  This is a 75 year old female with history as below presenting to the ER from dialysis center secondary to seizure.  Per EMS patient with seizure-like activity during the last 30 minutes of her dialysis session.  No fall, no head injury.  Seizure aborted spontaneously.  No medications given in route.  No further seizure-like activity.  Patient with known history of seizure, was previously on Keppra.  Per EMS patient had not been taking her Keppra per med review at pickup. She is a poor historian given prior CVA, she does also appear post ictal on my exam. No chest pain, dyspnea, abdominal pain, nausea or vomiting, no missed HD sessions.      The history is provided by the patient and the EMS personnel. No language interpreter was used.      Past Medical History:  Diagnosis Date   Cancer [facility]) 2017   Right breast   Depression    GERD (gastroesophageal reflux disease)    Glaucoma    Hemiparesis (HCC)    left side   High cholesterol    History of seizure    x 1 - after a spider bite   History of stroke with residual deficit    left-side weakness   Hypertension    states BP under control with meds., has been on med. x 2 yr.   Hypothyroidism    Non-insulin dependent type 2 diabetes mellitus ([place])    Overactive bladder    Stroke ([place])    1998 weakness on left side    Patient Active Problem List   Diagnosis Date Noted   Seizure ([place]) 07/15/2021   Recent cerebrovascular accident (CVA) 05/17/2021   History of CVA with residual deficit 05/17/2021   Cerebral embolism with cerebral infarction 05/13/2021   Physical deconditioning 04/30/2021   Generalized muscle weakness 04/30/2021   Pressure injury of skin 04/29/2021   AKI (acute kidney injury) ([place]) 04/28/2021   Streptococcal  sepsis, unspecified ([place]) 04/18/2021   Streptococcal bacteremia 04/18/2021   Leukocytosis 04/17/2021   Fever 04/17/2021   ESRD (end stage renal disease) ([place])    Acute kidney injury superimposed on CKD ([place]) 04/09/2021   ARF (acute renal failure) ([place]) 01/18/2021   Fall 01/17/2021   Hypertension    Hypothyroidism    Stroke ([place])    GERD (gastroesophageal reflux disease)    Anemia in chronic kidney disease (CKD)    Acute metabolic encephalopathy    Acute renal failure superimposed on stage 3b chronic kidney disease ([place])    Type II diabetes mellitus with renal manifestations ([place])    Abnormal mammogram of left breast 07/30/2018   Closed displaced oblique fracture of shaft of left humerus 03/14/2018   Osteopenia 01/20/2018   Multiple thyroid nodules 07/13/2017   Tracheal deviation 07/13/2017   Malignant neoplasm of upper-outer quadrant of right breast in female, estrogen receptor positive ([place]) 01/20/2017   Primary vulvar squamous cell carcinoma ([place]) 01/20/2017    Past Surgical History:  Procedure Laterality Date   ABDOMINAL HYSTERECTOMY     complete   BREAST BIOPSY Left 08/03/2018   Benign adipose tissue   BREAST EXCISIONAL BIOPSY Right 2014   Positive   BREAST LUMPECTOMY Right    BUBBLE STUDY  05/15/2021   Procedure: BUBBLE STUDY;  Surgeon: Marlou Porch,  Thana Farr, MD;  Location: [facility];  Service: Cardiovascular;;   CATARACT EXTRACTION W/ INTRAOCULAR LENS IMPLANT Left    CEREBRAL ANEURYSM REPAIR  1998   DIALYSIS/PERMA CATHETER INSERTION N/A 04/10/2021   Procedure: DIALYSIS/PERMA CATHETER INSERTION;  Surgeon: Algernon Huxley, MD;  Location: [facility] CV LAB;  Service: Cardiovascular;  Laterality: N/A;   IR FLUORO GUIDE CV LINE RIGHT  04/30/2021   PICC LINE INSERTION     TEE WITHOUT CARDIOVERSION N/A 05/15/2021   Procedure: TRANSESOPHAGEAL ECHOCARDIOGRAM (TEE);  Surgeon: Jerline Pain, MD;  Location: [facility] ENDOSCOPY;  Service: Cardiovascular;  Laterality: N/A;   THYROID LOBECTOMY  Right 07/15/2017   Procedure: RIGHT THYROID LOBECTOMY;  Surgeon: Armandina Gemma, MD;  Location: [facility];  Service: General;  Laterality: Right;     OB History   No obstetric history on file.     Family History  Problem Relation Age of Onset   Cancer Brother        possible prostate cancer per her daughter   Breast cancer Neg Hx     Social History   Tobacco Use   Smoking status: Never   Smokeless tobacco: Never  Vaping Use   Vaping Use: Never used  Substance Use Topics   Alcohol use: No   Drug use: No    Home Medications Prior to Admission medications   Medication Sig Start Date End Date Taking? Authorizing Provider  aspirin EC 81 MG tablet Take 81 mg by mouth daily.    [provider]  atorvastatin (LIPITOR) 40 MG tablet Take 1 tablet (40 mg total) by mouth daily. 05/17/21 06/16/21  Darliss Cheney, MD  cholecalciferol (VITAMIN D) 1000 units tablet Take 1,000 Units by mouth daily.    [provider]  KEPPRA XR 500 MG 24 hr tablet Take 500 mg by mouth daily. 01/21/21   [provider]  latanoprost (XALATAN) 0.005 % ophthalmic solution Place 1 drop into both eyes at bedtime. 09/07/18   Coral Spikes, DO  letrozole [facility]) 2.5 MG tablet Take 1 tablet (2.5 mg total) by mouth daily. 02/16/19   Nicholas Lose, MD  levothyroxine (SYNTHROID, LEVOTHROID) 112 MCG tablet Take 1 tablet (112 mcg total) by mouth daily before breakfast. 09/07/18   Coral Spikes, DO  metoprolol succinate (TOPROL-XL) 25 MG 24 hr tablet Take 50 mg by mouth daily. 08/20/20   [provider]    Allergies    Contrast media [iodinated diagnostic agents], Latex, Shellfish-derived products, and Levemir [insulin detemir]  Review of Systems   Review of Systems  Constitutional:  Negative for chills and fever.  HENT:  Negative for facial swelling and trouble swallowing.   Eyes:  Negative for photophobia and visual disturbance.  Respiratory:  Negative for cough and shortness of breath.    Cardiovascular:  Negative for chest pain and palpitations.  Gastrointestinal:  Negative for abdominal pain, nausea and vomiting.  Endocrine: Negative for polydipsia and polyuria.  Genitourinary:  Negative for difficulty urinating and hematuria.  Musculoskeletal:  Negative for gait problem and joint swelling.  Skin:  Negative for pallor and rash.  Neurological:  Positive for seizures and weakness. Negative for syncope and headaches.  Psychiatric/Behavioral:  Negative for agitation and confusion.    Physical Exam Updated Vital Signs BP 103/62   Pulse 75   Temp (!) 97.4 F (36.3 C) (Oral)   Resp 18   SpO2 100%   Physical Exam Vitals and nursing note reviewed.  Constitutional:      General:  She is not in acute distress.    Appearance: Normal appearance.  HENT:     Head: Normocephalic and atraumatic.     Comments: No tongue laceration     Right Ear: External ear normal.     Left Ear: External ear normal.     Nose: Nose normal.     Mouth/Throat:     Mouth: Mucous membranes are moist.  Eyes:     General: No scleral icterus.       Right eye: No discharge.        Left eye: No discharge.  Cardiovascular:     Rate and Rhythm: Normal rate and regular rhythm.     Pulses: Normal pulses.     Heart sounds: Murmur heard.  Pulmonary:     Effort: Pulmonary effort is normal. No respiratory distress.     Breath sounds: Normal breath sounds.  Chest:    Abdominal:     General: Abdomen is flat.     Tenderness: There is no abdominal tenderness.  Musculoskeletal:        General: No swelling.     Cervical back: Normal range of motion.     Right lower leg: No edema.     Left lower leg: No edema.  Skin:    General: Skin is warm and dry.     Capillary Refill: Capillary refill takes less than 2 seconds.  Neurological:     Mental Status: She is alert and oriented to person, place, and time. Mental status is at baseline.     GCS: GCS eye subscore is 4. GCS verbal subscore is 5. GCS motor  subscore is 6.     Comments: Left sided residual deficit. Moving right sided extremities appropriately. Interactive with examiner.   Psychiatric:        Mood and Affect: Mood normal.        Behavior: Behavior normal.    ED Results / Procedures / Treatments   Labs (all labs ordered are listed, but only abnormal results are displayed) Labs Reviewed  T4, FREE - Abnormal; Notable for the following components:      Result Value   Free T4 1.37 (*)    All other components within normal limits  CBC WITH DIFFERENTIAL/PLATELET - Abnormal; Notable for the following components:   RBC 3.35 (*)    Hemoglobin 9.8 (*)    HCT 32.1 (*)    RDW 15.8 (*)    All other components within normal limits  BASIC METABOLIC PANEL - Abnormal; Notable for the following components:   Glucose, Bld 109 (*)    Creatinine, Ser 2.61 (*)    Calcium 8.4 (*)    GFR, Estimated 18 (*)    All other components within normal limits  RESP PANEL BY RT-PCR (FLU A&B, COVID) ARPGX2  TSH    EKG EKG Interpretation  Date/Time:  Wednesday July 15 2021 16:55:41 EDT Ventricular Rate:  94 PR Interval:  122 QRS Duration: 98 QT Interval:  375 QTC Calculation: 469 R Axis:   -29 Text Interpretation: Sinus rhythm Inferior infarct, old Anterior infarct, old Similar to previous tracing Confirmed by Wynona Dove ([id number]) on 07/15/2021 5:27:43 PM  Radiology No results found.  Procedures Procedures   Medications Ordered in ED Medications  levETIRAcetam (KEPPRA) IVPB 1500 mg/ 100 mL premix (0 mg Intravenous Stopped 07/15/21 1936)    ED Course  I have reviewed the triage vital signs and the nursing notes.  Pertinent labs & imaging results that were available  during my care of the patient were reviewed by me and considered in my medical decision making (see chart for details).  Clinical Course as of 07/15/21 2331  Wed Jul 15, 2021  1945 D/w Daughter Kirsten Mcmahon [phone]; updated on current findings and plan.  Discussed disposition, agreeable with plan at this time, all questions answered.  [SG]    Clinical Course User Index [SG] Jeanell Sparrow, DO   MDM Rules/Calculators/A&P                         This is a 75 year old female patient history of ESRD on hemodialysis, previous CVA.  Presented to ER secondary to breakthrough seizure.  Unclear compliance with patient's home medications.  Patient initially postictal upon arrival however this is improved upon repeat examination.  Acting at baseline per son who is at bedside who is primary caretaker.  Prior to the son's arrival discussed with the daughter who is unsure if patient has been taking her Keppra.  Per EMS report at dialysis center patient had not been taking her Keppra.  We will load patient with Keppra in the ER.  Seizure precautions in place.  Later was able to D/w son who the patient lives with and reports that she has been taking all her medications as prescribed.   Screening labs reviewed and are stable.  Creatinine similar to baseline, potassium is within normal limits.  ECG without evidence of acute ischemia.  Patient with likely breakthrough seizure.  Questionable compliance with anti-epileptic medication.  Was loaded with Keppra in the ER.  No further seizure activity observed.  Patient's complicated medical history and age - recommend admission at this time.  Family and patient are agreeable.  Discussed with admitting team accepts patient.  Discussed with neurology, they will come to evaluate the patient.    Patient stable for admission Final Clinical Impression(s) / ED Diagnoses Final diagnoses:  ESRD (end stage renal disease) ([place])  Breakthrough seizure ([place])  History of CVA (cerebrovascular accident)    Rx / DC Orders ED Discharge Orders     None        Jeanell Sparrow, DO 07/15/21 2332

## 2021-07-15 NOTE — ED Notes (Signed)
Daughter Erynne Kealey [phone] would like an update asap

## 2021-07-15 NOTE — ED Triage Notes (Signed)
Pt bib [facility] from dialysis. Per ems, Pt had a witnessed seizure for abt 3 mins during the treatment. HX CVA which affected pt's left side. Pt  was 30 mins away from finishing the treatment. A&O *4   EMS vitals:  BP 158/ 110  HR 95  RR 15  O2 98% RA

## 2021-07-15 NOTE — Consult Note (Addendum)
Neurology Consultation Reason for Consult: Breakthrough seizure in the setting of dialysis Requesting Physician: Inda Merlin  CC: Breakthrough seizure  History is obtained from: family and chart review  HPI: Kirsten Mcmahon is a 75 y.o. female with a past medical history significant for chronic right MCA stroke (1998) with residual left hemiparesis, diabetes, hypertension, hyperlipidemia, hypothyroidism, breast cancer (2017), recent streptococcal bacteremia (May 2022) treated with IV vancomycin, as well as recent multifocal punctate strokes (June 2022, TEE 05/15/21 negative for endocarditis), ESRD on hemodialysis,  Regarding her seizure history, she had an episode of staring off and being unresponsive on 01/18/2021 with routine EEG demonstrating bilateral frontotemporal spikes on 2/14 for which she was started on Keppra.  There is also a chart note of seizure after a spider bite, unclear timeline.   Son reports she's been doing great at home recently, improving since last discharge, -- feeds herself, eats well, needs help with dressing, son helps with medications. He notes ran out of all her medications yesterday including keppra so she missed this morning's dose (notably, contrary to ED provider note which states he reported full adherence with all medications, please note that he did initially tell me that she had not missed any medications but then self-corrected and reported she had missed only this morning's dose). Doesn't take supplemental post dialysis dose afterwards. Son also reports she is intermittently slightly confused more often these days, often after taking morning meds, and wonders if this could be medication side effect.   She was loaded with 1.5 g of Keppra by ED provider prior to level being collected  ROS:  Unable to obtain due to altered mental status.   Past Medical History:  Diagnosis Date   Cancer [facility]) 2017   Right breast   Depression    GERD (gastroesophageal  reflux disease)    Glaucoma    Hemiparesis (HCC)    left side   High cholesterol    History of seizure    x 1 - after a spider bite   History of stroke with residual deficit    left-side weakness   Hypertension    states BP under control with meds., has been on med. x 2 yr.   Hypothyroidism    Non-insulin dependent type 2 diabetes mellitus ([place])    Overactive bladder    Stroke [facility])    1998 weakness on left side   Past Surgical History:  Procedure Laterality Date   ABDOMINAL HYSTERECTOMY     complete   BREAST BIOPSY Left 08/03/2018   Benign adipose tissue   BREAST EXCISIONAL BIOPSY Right 2014   Positive   BREAST LUMPECTOMY Right    BUBBLE STUDY  05/15/2021   Procedure: BUBBLE STUDY;  Surgeon: Jerline Pain, MD;  Location: [facility];  Service: Cardiovascular;;   CATARACT EXTRACTION W/ INTRAOCULAR LENS IMPLANT Left    CEREBRAL ANEURYSM REPAIR  1998   DIALYSIS/PERMA CATHETER INSERTION N/A 04/10/2021   Procedure: DIALYSIS/PERMA CATHETER INSERTION;  Surgeon: Algernon Huxley, MD;  Location: [facility] [facility] CV LAB;  Service: Cardiovascular;  Laterality: N/A;   IR FLUORO GUIDE CV LINE RIGHT  04/30/2021   PICC LINE INSERTION     TEE WITHOUT CARDIOVERSION N/A 05/15/2021   Procedure: TRANSESOPHAGEAL ECHOCARDIOGRAM (TEE);  Surgeon: Jerline Pain, MD;  Location: [facility] ENDOSCOPY;  Service: Cardiovascular;  Laterality: N/A;   THYROID LOBECTOMY Right 07/15/2017   Procedure: RIGHT THYROID LOBECTOMY;  Surgeon: Armandina Gemma, MD;  Location: [facility];  Service: General;  Laterality: Right;  No current facility-administered medications for this encounter.  Current Outpatient Medications:    aspirin EC 81 MG tablet, Take 81 mg by mouth daily., Disp: , Rfl:    atorvastatin (LIPITOR) 40 MG tablet, Take 1 tablet (40 mg total) by mouth daily., Disp: 30 tablet, Rfl: 0   cholecalciferol (VITAMIN D) 1000 units tablet, Take 1,000 Units by mouth daily., Disp: , Rfl:    KEPPRA XR 500 MG 24 hr tablet, Take 500 mg  by mouth daily., Disp: , Rfl:    latanoprost (XALATAN) 0.005 % ophthalmic solution, Place 1 drop into both eyes at bedtime., Disp: 2.5 mL, Rfl: 0   letrozole (FEMARA) 2.5 MG tablet, Take 1 tablet (2.5 mg total) by mouth daily., Disp: 90 tablet, Rfl: 3   levothyroxine (SYNTHROID, LEVOTHROID) 112 MCG tablet, Take 1 tablet (112 mcg total) by mouth daily before breakfast., Disp: 90 tablet, Rfl: 0   metoprolol succinate (TOPROL-XL) 25 MG 24 hr tablet, Take 50 mg by mouth daily., Disp: , Rfl:    Family History  Problem Relation Age of Onset   Cancer Brother        possible prostate cancer per her daughter   Breast cancer Neg Hx      Social History:  reports that she has never smoked. She has never used smokeless tobacco. She reports that she does not drink alcohol and does not use drugs.   Exam: Current vital signs: BP 103/62   Pulse 75   Temp (!) 97.4 F (36.3 C) (Oral)   Resp 18   SpO2 100%  Vital signs in last 24 hours: Temp:  [97.4 F (36.3 C)] 97.4 F (36.3 C) (08/10 1659) Pulse Rate:  [75-97] 75 (08/10 2245) Resp:  [14-25] 18 (08/10 2245) BP: (103-168)/(62-132) 103/62 (08/10 2245) SpO2:  [99 %-100 %] 100 % (08/10 2245)   Physical Exam  Constitutional: Appears well-developed and well-nourished.  Psych: Minimally interactive, very sedated Eyes: No scleral injection HENT: No oropharyngeal obstruction.  MSK: no joint deformities.  Cardiovascular: Normal rate and regular rhythm.  Respiratory: Effort normal, non-labored breathing GI: Soft.  No distension. There is no tenderness.  Skin: Warm dry and intact visible skin  Neuro: Mental Status: Patient is sleepy and requires multiple repeated noxious stimulation to briefly interact with examiner, oriented to person, month, but then stops responding Cranial Nerves: II: Pupils are equal, round, and reactive to light.   III,IV, VI: EOMI without ptosis or diploplia.  V: Facial sensation is symmetric to temperature VII: Facial  movement is symmetric on grimace.  VIII: hearing is intact to voice (intermittently will open eyes to voice) Motor: Tone is normal. Bulk is normal.  She moves the right side more briskly than the left but is too sleepy to participate in confrontational strength testing Sensory: Equally reactive to noxious stimulation in all 4 extremities Deep Tendon Reflexes: Increased in the left biceps and patella compared to the right Plantars: Toes are downgoing bilaterally.  Cerebellar: Unable to assess secondary to patient's mental status   I have reviewed labs in epic and the results pertinent to this consultation are: CMP with mildly elevated glucose of 109, creatinine of 2.61 with GFR of 18 (baseline creatinine 2.6-5.6)  CBC with hemoglobin of 9.8 (baseline 8.7), elevated RDW of 15.8 TSH of 0.429 (low normal), T4 1.37 Flu/COVID panel negative  Impression: Breakthrough seizure in the setting of missed medication dose this morning followed by dialysis which further lowered serum Keppra concentration. I will add a postdialysis supplemental dose  to help prevent future breakthrough seizures but not increase dosing at this time given patient has some cognitive side effects at the current dose based on family's report.  Recommendations: -Continue Keppra 500 mg daily -Additional Keppra 250 mg postdialysis  -Discussed with son that, given her baseline, time to recovery may be prolonged, and some of her current somnolence may be also secondary to the loading dose of Keppra she received in the ED. Expect gradual continued recovery on the order of a few days.  -Head CT to rule out acute intracranial process, no need for repeat EEG unless the patient has further events or is not returning to baseline -Outpatient neurology follow-up -Neurology will be available on an as-needed basis going forward, please reconsult if patient is not improving as expected or new neurological questions arise  Addendum: Head CT  without acute intracranial process.  Lesleigh Noe MD-PhD [facility] [phone] Available 7 PM to 7 AM, outside of these hours please call Neurologist on call as listed on Amion.

## 2021-07-16 ENCOUNTER — Encounter (HOSPITAL_COMMUNITY): Payer: Self-pay | Admitting: Internal Medicine

## 2021-07-16 ENCOUNTER — Observation Stay (HOSPITAL_COMMUNITY): Payer: Medicare (Managed Care)

## 2021-07-16 DIAGNOSIS — E1122 Type 2 diabetes mellitus with diabetic chronic kidney disease: Secondary | ICD-10-CM

## 2021-07-16 DIAGNOSIS — I10 Essential (primary) hypertension: Secondary | ICD-10-CM

## 2021-07-16 DIAGNOSIS — N186 End stage renal disease: Secondary | ICD-10-CM

## 2021-07-16 DIAGNOSIS — E039 Hypothyroidism, unspecified: Secondary | ICD-10-CM

## 2021-07-16 DIAGNOSIS — E1169 Type 2 diabetes mellitus with other specified complication: Secondary | ICD-10-CM | POA: Diagnosis present

## 2021-07-16 DIAGNOSIS — Z992 Dependence on renal dialysis: Secondary | ICD-10-CM

## 2021-07-16 LAB — GLUCOSE, CAPILLARY
Glucose-Capillary: 251 mg/dL — ABNORMAL HIGH (ref 70–99)
Glucose-Capillary: 121 mg/dL — ABNORMAL HIGH (ref 70–99)
Glucose-Capillary: 92 mg/dL (ref 70–99)

## 2021-07-16 LAB — CBC WITH DIFFERENTIAL/PLATELET
Abs Immature Granulocytes: 0.02 10*3/uL (ref 0.00–0.07)
Basophils Absolute: 0.1 10*3/uL (ref 0.0–0.1)
Basophils Relative: 1 %
Eosinophils Absolute: 0.2 10*3/uL (ref 0.0–0.5)
Eosinophils Relative: 3 %
HCT: 31.4 % — ABNORMAL LOW (ref 36.0–46.0)
Hemoglobin: 9.4 g/dL — ABNORMAL LOW (ref 12.0–15.0)
Immature Granulocytes: 0 %
Lymphocytes Relative: 27 %
Lymphs Abs: 1.8 10*3/uL (ref 0.7–4.0)
MCH: 29.1 pg (ref 26.0–34.0)
MCHC: 29.9 g/dL — ABNORMAL LOW (ref 30.0–36.0)
MCV: 97.2 fL (ref 80.0–100.0)
Monocytes Absolute: 0.8 10*3/uL (ref 0.1–1.0)
Monocytes Relative: 12 %
Neutro Abs: 3.8 10*3/uL (ref 1.7–7.7)
Neutrophils Relative %: 57 %
Platelets: 173 10*3/uL (ref 150–400)
RBC: 3.23 MIL/uL — ABNORMAL LOW (ref 3.87–5.11)
RDW: 15.9 % — ABNORMAL HIGH (ref 11.5–15.5)
WBC: 6.6 10*3/uL (ref 4.0–10.5)
nRBC: 0 % (ref 0.0–0.2)

## 2021-07-16 LAB — COMPREHENSIVE METABOLIC PANEL
ALT: 22 U/L (ref 0–44)
AST: 23 U/L (ref 15–41)
Albumin: 2.6 g/dL — ABNORMAL LOW (ref 3.5–5.0)
Alkaline Phosphatase: 60 U/L (ref 38–126)
Anion gap: 8 (ref 5–15)
BUN: 13 mg/dL (ref 8–23)
CO2: 27 mmol/L (ref 22–32)
Calcium: 8.5 mg/dL — ABNORMAL LOW (ref 8.9–10.3)
Chloride: 103 mmol/L (ref 98–111)
Creatinine, Ser: 3.52 mg/dL — ABNORMAL HIGH (ref 0.44–1.00)
GFR, Estimated: 13 mL/min — ABNORMAL LOW (ref >60)
Glucose, Bld: 85 mg/dL (ref 70–99)
Potassium: 3.8 mmol/L (ref 3.5–5.1)
Sodium: 138 mmol/L (ref 135–145)
Total Bilirubin: 0.5 mg/dL (ref 0.3–1.2)
Total Protein: 6.1 g/dL — ABNORMAL LOW (ref 6.5–8.1)

## 2021-07-16 LAB — CBG MONITORING, ED
Glucose-Capillary: 55 mg/dL — ABNORMAL LOW (ref 70–99)
Glucose-Capillary: 62 mg/dL — ABNORMAL LOW (ref 70–99)
Glucose-Capillary: 82 mg/dL (ref 70–99)
Glucose-Capillary: 59 mg/dL — ABNORMAL LOW (ref 70–99)
Glucose-Capillary: 84 mg/dL (ref 70–99)
Glucose-Capillary: 58 mg/dL — ABNORMAL LOW (ref 70–99)

## 2021-07-16 LAB — MAGNESIUM: Magnesium: 2 mg/dL (ref 1.7–2.4)

## 2021-07-16 IMAGING — CT CT HEAD W/O CM
3 series · 17 of 30 positions shown, 19 images · non-contrast
Comparison: Brain MRI [DATE].  Head CT [DATE].

CLINICAL DATA: 75-year-old female with witnessed seizure at
dialysis.

EXAM:
CT HEAD WITHOUT CONTRAST
TECHNIQUE: Contiguous axial images were obtained from the base of the skull
through the vertex without intravenous contrast.

[Series 3: head bone · axial · 0.44mm/px · z∈[-106,+10]mm · 7 of 80 slices shown]
[im 6/80  bone]
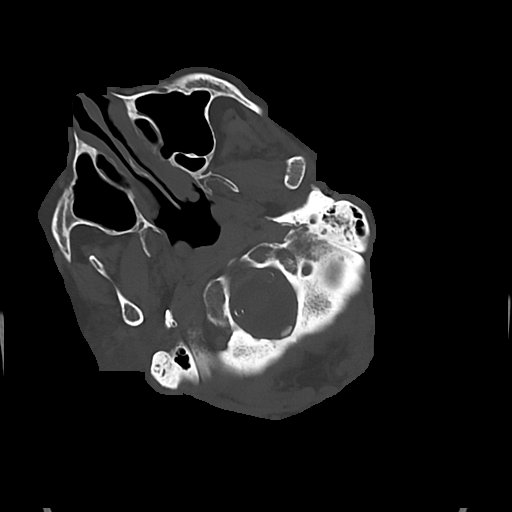
[im 16/80  bone]
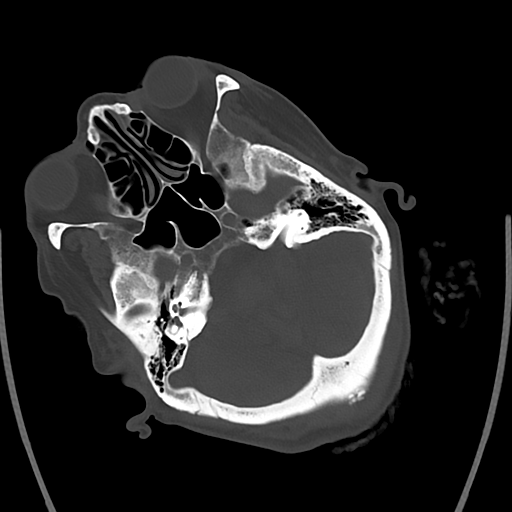
[im 27/80  bone]
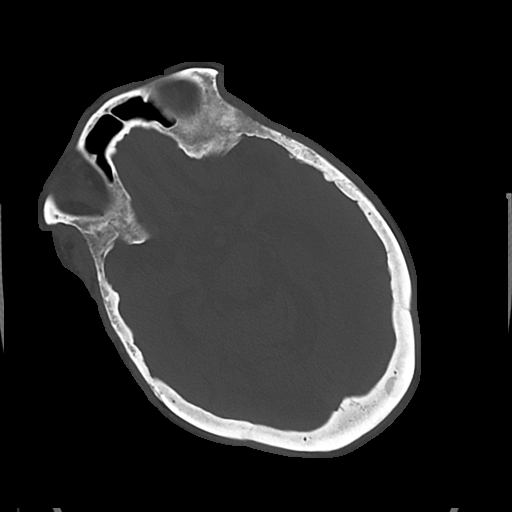
[im 37/80  bone]
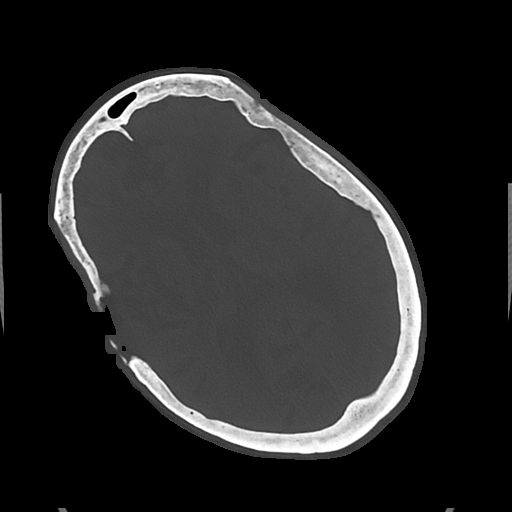
[im 43/80  bone]
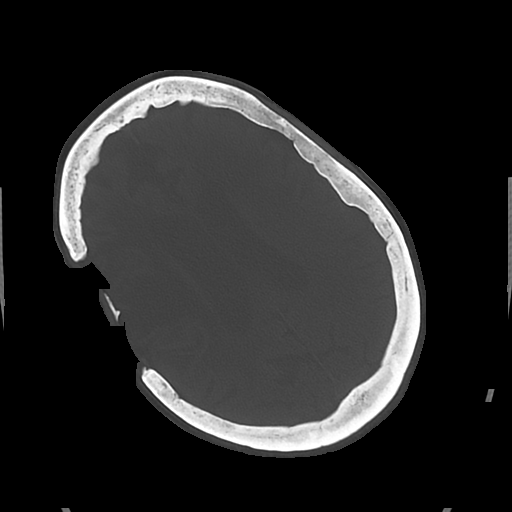
[im 53/80  bone]
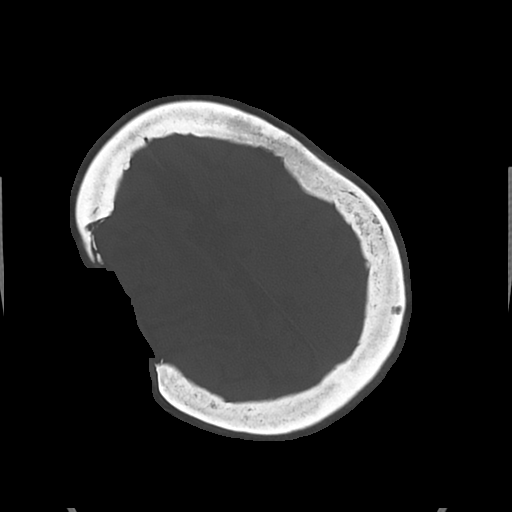
[im 64/80  bone]
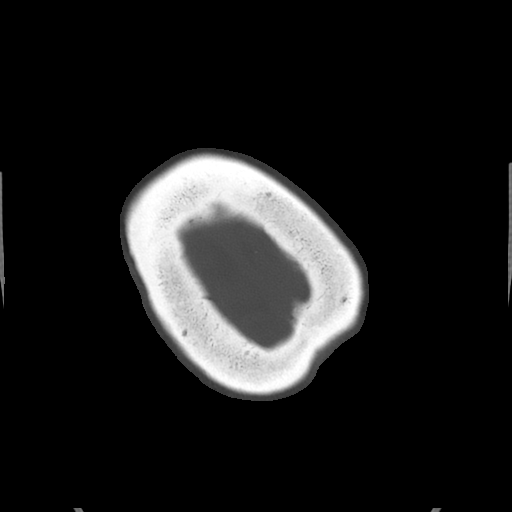

[Series 6: head wo · axial · 0.44mm/px · z∈[-92,+8]mm · 5 of 32 slices shown, 7 images (1 of 2)]
[im 6/32  brain]
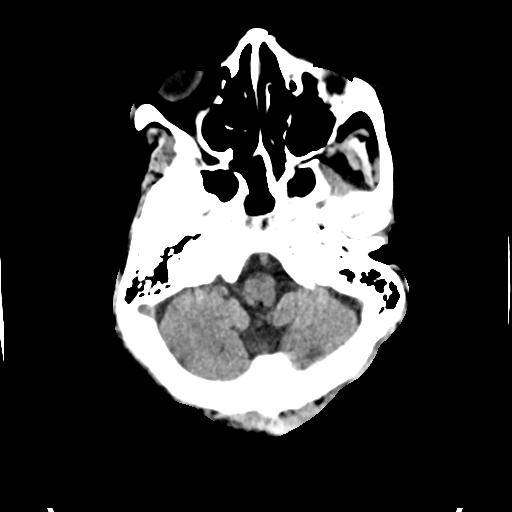
[im 6/32  bone]
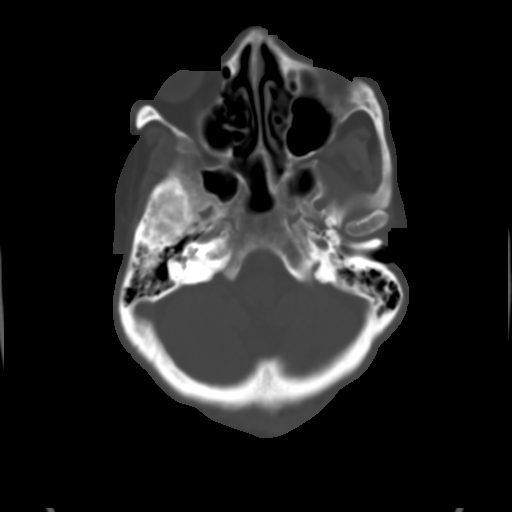
[im 11/32  brain]
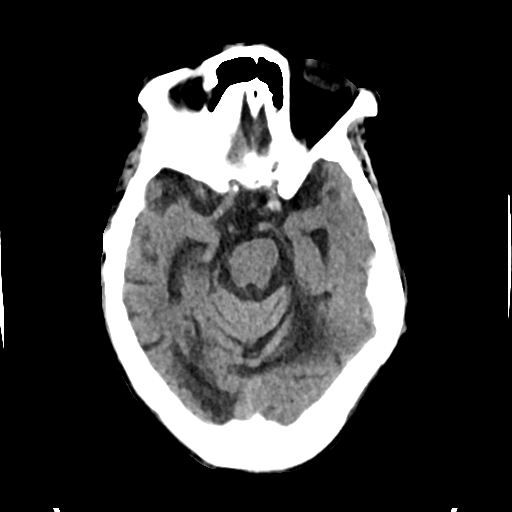
[im 16/32  brain]
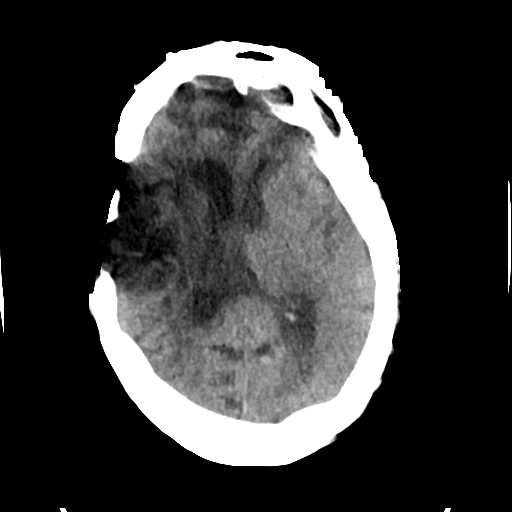
[im 21/32  brain]
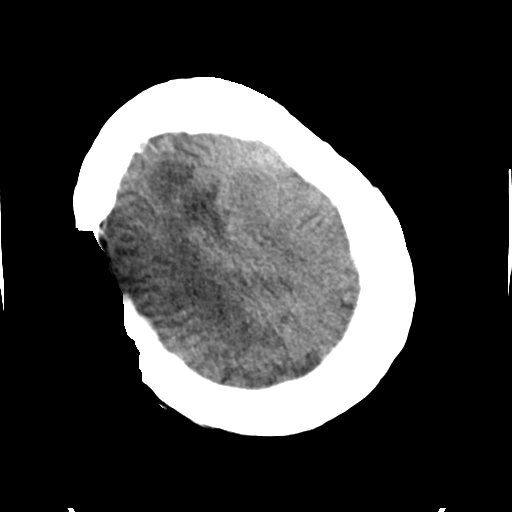
[im 26/32  brain]
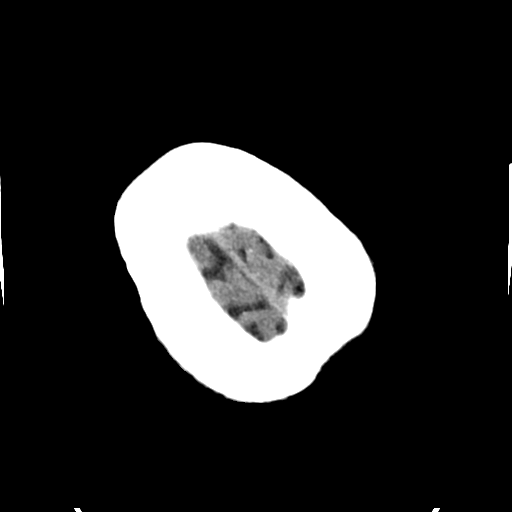
[im 26/32  bone]
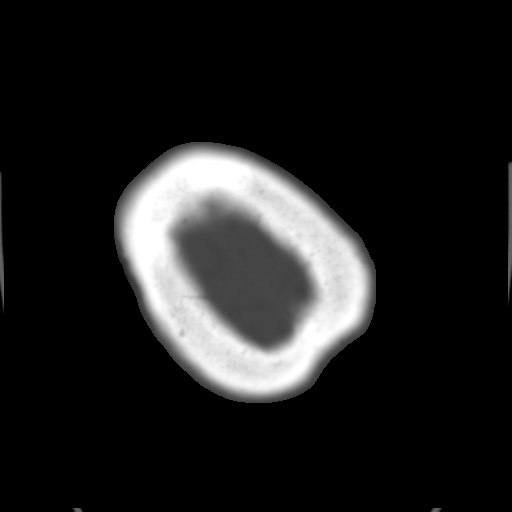

[Series 7: head wo · axial · 0.44mm/px · z∈[-92,+8]mm · 5 of 32 slices shown (2 of 2)]
[im 6/32  brain]
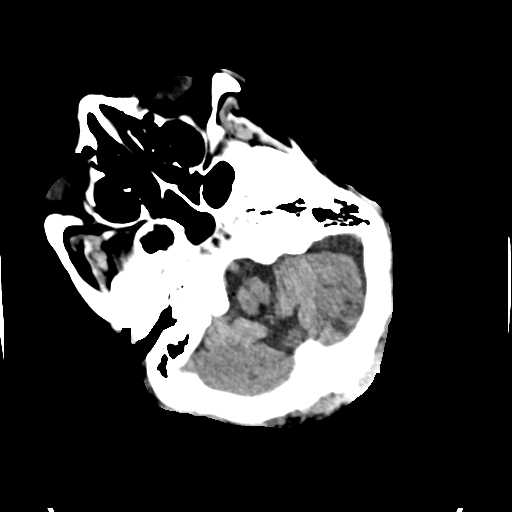
[im 11/32  brain]
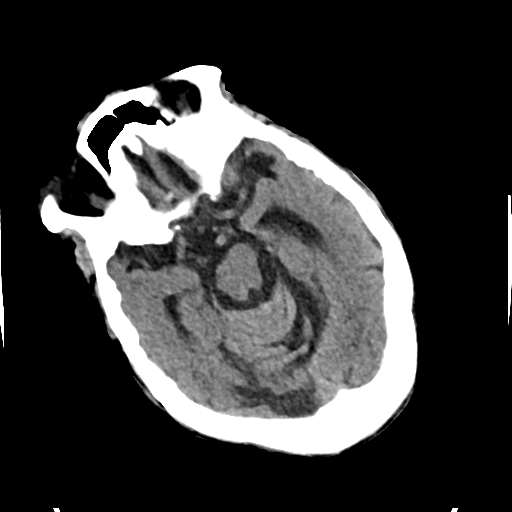
[im 16/32  brain]
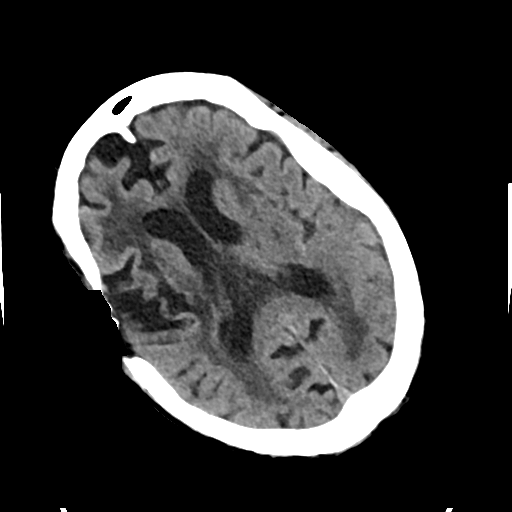
[im 21/32  brain]
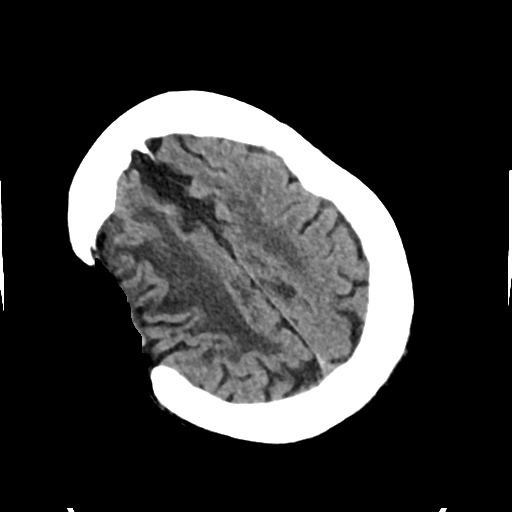
[im 26/32  brain]
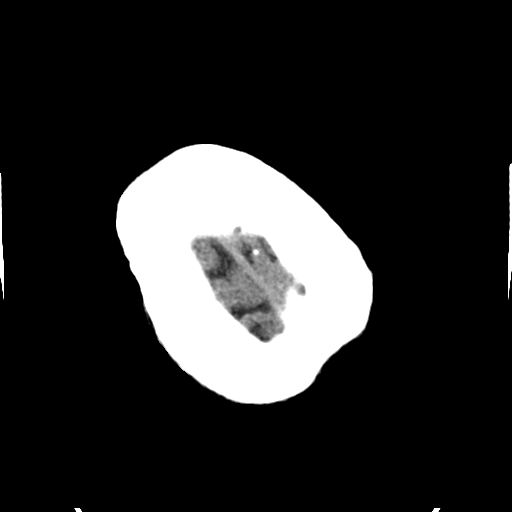

[17 of 30 positions shown; findings below may reference images not displayed]

FINDINGS: Brain: Advanced chronic ischemic disease, including confluent
chronic right MCA and right PCA territory infarcts with
encephalomalacia. Right side brainstem Wallerian degeneration. Small
chronic infarcts in the bilateral thalami and cerebellum. Patchy and
confluent bilateral cerebral white matter hypodensity.

Stable gray-white matter differentiation throughout the brain. No
midline shift, ventriculomegaly, mass effect, evidence of mass
lesion, intracranial hemorrhage or evidence of cortically based
acute infarction.

Vascular: Calcified atherosclerosis at the skull base. No suspicious
intracranial vascular hyperdensity.

Skull: Chronic right hemi craniectomy. Stable visualized osseous
structures.

Sinuses/Orbits: Visualized paranasal sinuses and mastoids are stable
and well aerated.

Other: No acute orbit or scalp soft tissue finding. Postoperative
changes to the left globe, and right scalp.
IMPRESSION: 1. No acute intracranial abnormality.
2. Stable appearance of prior right craniectomy and advanced
underlying chronic ischemic disease.

## 2021-07-16 MED ORDER — DEXTROSE 50 % IV SOLN
1.0000 | Freq: Once | INTRAVENOUS | Status: AC
  Administered 2021-07-16: 50 mL via INTRAVENOUS
  Filled 2021-07-16: qty 50

## 2021-07-16 MED ORDER — INSULIN ASPART 100 UNIT/ML IJ SOLN
0.0000 [IU] | Freq: Three times a day (TID) | INTRAMUSCULAR | Status: DC
  Administered 2021-07-19: 1 [IU] via SUBCUTANEOUS

## 2021-07-16 MED ORDER — HEPARIN SODIUM (PORCINE) 5000 UNIT/ML IJ SOLN
5000.0000 [IU] | Freq: Three times a day (TID) | INTRAMUSCULAR | Status: DC
  Administered 2021-07-16 – 2021-07-31 (×43): 5000 [IU] via SUBCUTANEOUS
  Filled 2021-07-16 (×42): qty 1

## 2021-07-16 MED ORDER — DEXTROSE 5 % IV SOLN: INTRAVENOUS | Status: DC

## 2021-07-16 MED ORDER — ACETAMINOPHEN 650 MG RE SUPP: 650.0000 mg | Freq: Four times a day (QID) | RECTAL | Status: DC | PRN

## 2021-07-16 MED ORDER — METOPROLOL SUCCINATE ER 50 MG PO TB24
50.0000 mg | ORAL_TABLET | Freq: Every day | ORAL | Status: DC
  Administered 2021-07-21 – 2021-07-31 (×9): 50 mg via ORAL
  Filled 2021-07-16 (×13): qty 1
  Filled 2021-07-16: qty 2

## 2021-07-16 MED ORDER — LATANOPROST 0.005 % OP SOLN
1.0000 [drp] | Freq: Every day | OPHTHALMIC | Status: DC
  Administered 2021-07-17 – 2021-07-27 (×11): 1 [drp] via OPHTHALMIC
  Filled 2021-07-16 (×3): qty 2.5

## 2021-07-16 MED ORDER — CHLORHEXIDINE GLUCONATE CLOTH 2 % EX PADS
6.0000 | MEDICATED_PAD | Freq: Every day | CUTANEOUS | Status: DC
  Administered 2021-07-18 – 2021-07-31 (×13): 6 via TOPICAL

## 2021-07-16 MED ORDER — CALCITRIOL 0.25 MCG PO CAPS
0.5000 ug | ORAL_CAPSULE | ORAL | Status: DC
  Administered 2021-07-22 – 2021-07-29 (×2): 0.5 ug via ORAL
  Filled 2021-07-16: qty 2

## 2021-07-16 MED ORDER — LEVOTHYROXINE SODIUM 112 MCG PO TABS
112.0000 ug | ORAL_TABLET | Freq: Every day | ORAL | Status: DC
  Administered 2021-07-16 – 2021-07-31 (×9): 112 ug via ORAL
  Filled 2021-07-16 (×15): qty 1

## 2021-07-16 MED ORDER — DEXTROSE 10 % IV SOLN: INTRAVENOUS | Status: DC

## 2021-07-16 MED ORDER — ASPIRIN EC 81 MG PO TBEC
81.0000 mg | DELAYED_RELEASE_TABLET | Freq: Every day | ORAL | Status: DC
  Filled 2021-07-16 (×3): qty 1

## 2021-07-16 MED ORDER — LETROZOLE 2.5 MG PO TABS
2.5000 mg | ORAL_TABLET | Freq: Every day | ORAL | Status: DC
  Administered 2021-07-21 – 2021-07-31 (×9): 2.5 mg via ORAL
  Filled 2021-07-16 (×16): qty 1

## 2021-07-16 MED ORDER — ONDANSETRON HCL 4 MG PO TABS: 4.0000 mg | ORAL_TABLET | Freq: Four times a day (QID) | ORAL | Status: DC | PRN

## 2021-07-16 MED ORDER — ACETAMINOPHEN 325 MG PO TABS
650.0000 mg | ORAL_TABLET | Freq: Four times a day (QID) | ORAL | Status: DC | PRN
  Administered 2021-07-18: 650 mg via ORAL
  Filled 2021-07-16: qty 2

## 2021-07-16 MED ORDER — POLYETHYLENE GLYCOL 3350 17 G PO PACK
17.0000 g | PACK | Freq: Every day | ORAL | Status: DC | PRN
  Administered 2021-07-30: 17 g via ORAL
  Filled 2021-07-16: qty 1

## 2021-07-16 MED ORDER — ONDANSETRON HCL 4 MG/2ML IJ SOLN
4.0000 mg | Freq: Four times a day (QID) | INTRAMUSCULAR | Status: DC | PRN
  Administered 2021-07-17: 4 mg via INTRAVENOUS
  Filled 2021-07-16: qty 2

## 2021-07-16 NOTE — Progress Notes (Signed)
Pt received in bed aggressive and rude, alert but not oriented. Pt refused all treatments. blood sugar 251. Reorientation attempted. Pt swinging at staff. Mitts and calm music played for comfort. Son called with updates.

## 2021-07-16 NOTE — Progress Notes (Signed)
Same day brief note;  Patient is a 75 year old female with history of ESRD on dialysis on Monday Wednesday Friday, history of breast cancer, history of stroke, diabetes type 2, seizure disorder who was brought to the emergency department after she had seizure-like activity during hemodialysis.  As per the report, patient ran out of her [place] on 8/9.  Patient had 30 minutes left in her hemodialysis session, she started to have seizure-like activity.  Hemodialysis was stopped and she was brought to the emergency department.  She was postictal on presentation, lethargic, disoriented.  CT head did not show any acute intracranial normalities.  Neurology was consulted who recommended to continue Keppra 500 mg daily and 250 mg postdialysis. Patient seen and examined at the bedside this afternoon.  She was hemodynamically stable during my evaluation.  She had some episodes of hypoglycemia for which we started her on D5 at 50 ml per hour.  She is still little confused, oriented to place only.  CT did not show any signs of distress.  We will continue current management plan.  I called her daughter [name] on the given number for update, call not received. I will also notify nephrology about her presence here for possible dialysis tomorrow.

## 2021-07-16 NOTE — Progress Notes (Signed)
Kirsten Mcmahon is a 75 Y/O Female admitted as observation patient for breakthrough seizure at HD center at HD center 07/15/2021. We will manage HD tomorrow and will consult formally if patient status upgraded to in-patient.    HD Orders: Kirsten Mcmahon MWF 3:45 hr 180 NRE 400/Autoflow 1.5 56 kg 3.0 K/2.5 Ca TDC  -No Heparin -Mircera 150 mcg IV Q 2 weeks (last dose 07/15/2021) -Venofer 50 mg IV weekly (last dose 07/15/2021) -Calcitriol 0.5 mcg PO TIW   Kirsten Mcmahon [facility] [phone]

## 2021-07-16 NOTE — ED Notes (Signed)
CBG 55, pt eating breakfast at this time and given orange juice. Pt is at baseline orientation

## 2021-07-16 NOTE — ED Notes (Signed)
Pt refused temp and to wear a mask for transport. Pt tried to Mudlogger and NT

## 2021-07-16 NOTE — Progress Notes (Signed)
Unable to assess skin or place Pt on telemetry at this time. To be reattempted.

## 2021-07-16 NOTE — ED Notes (Signed)
Pt is clean and dry. RN reminded pt to keep arm straight so the IV fluids can go in properly.

## 2021-07-16 NOTE — ED Notes (Signed)
Patient transported to CT 

## 2021-07-16 NOTE — ED Notes (Signed)
Daughter Kathleen Tamm [phone] would like an update on the patient

## 2021-07-16 NOTE — ED Notes (Signed)
Pt found in room confused, refused cbg check. It was rechecked against pt wishes due confusion. Result: CBG 55, Paige RN notified. Pt was fed in total assistance, 40% of breakfast eaten, refused the rest.CBG rechecked result 62

## 2021-07-16 NOTE — ED Notes (Signed)
RN attempted report x1.  

## 2021-07-16 NOTE — H&P (Signed)
History and Physical    KHALIDAH HERBOLD AOZ:[id number] DOB: October 30, 1946 DOA: 07/15/2021  PCP: [facility]  Patient coming from: Hemodialysis Center via EMS   Chief Complaint: Seizure Like Activity    HPI:    75 year old female with past medical history of hypothyroidism, remote history of breast cancer (2017), end-stage renal disease (MWF HD), previous strokes (right MCA stroke in 1998, and recent multifocal punctate strokes in 05/2021), seizure disorder (Dx 01/2021), diabetes mellitus type 2 (hgb A1C 5.6 05/2021) strep bacteremia (treated with IV vancomycin with hemodialysis, 04/2021) who presents to [facility] emergency department via EMS after patient exhibited seizure-like activity during hemodialysis.  Patient is extremely lethargic and is unable to provide history.  80 the history has been obtained from patient's son via phone conversation.  Patient's son explains that patient ran out of her [place] on 8/9.  Unfortunately, a refill was not obtained prior to running out of medication.  On 8/10, patient did not receive her Keppra and proceeded to go to hemodialysis.  With approximately 30 minutes left in the patient's hemodialysis session patient began to exhibit seizure-like activity.  Hemodialysis session was abruptly stopped and EMS was contacted who promptly came to evaluate the patient.  Seizure-like activity at this point had abruptly stopped however EMS did bring the patient into [facility] emergency department for further evaluation.  Upon evaluation in the emergency department CT imaging the head was performed revealing no evidence of acute disease.  Initial laboratory work-up did not reveal any significant abnormalities considering patient's known history of end-stage renal disease.  ER provider loaded the patient with intravenous Keppra.  Level was not obtained.  ER provider then consulted Dr. Curly Shores with neurology has agreed to see the patient  in consultation.  The hospitalist group was then called to assess the patient for admission the hospital.  Review of Systems:   Review of Systems  Unable to perform ROS: Mental acuity   Past Medical History:  Diagnosis Date   Cancer ([place]) 2017   Right breast   Depression    GERD (gastroesophageal reflux disease)    Glaucoma    Hemiparesis ([place])    left side   High cholesterol    History of seizure    x 1 - after a spider bite   History of stroke with residual deficit    left-side weakness   Hypertension    states BP under control with meds., has been on med. x 2 yr.   Hypothyroidism    Non-insulin dependent type 2 diabetes mellitus ([place])    Overactive bladder    Stroke [facility])    1998 weakness on left side    Past Surgical History:  Procedure Laterality Date   ABDOMINAL HYSTERECTOMY     complete   BREAST BIOPSY Left 08/03/2018   Benign adipose tissue   BREAST EXCISIONAL BIOPSY Right 2014   Positive   BREAST LUMPECTOMY Right    BUBBLE STUDY  05/15/2021   Procedure: BUBBLE STUDY;  Surgeon: Jerline Pain, MD;  Location: [place] ENDOSCOPY;  Service: Cardiovascular;;   CATARACT EXTRACTION W/ INTRAOCULAR LENS IMPLANT Left    CEREBRAL ANEURYSM REPAIR  1998   DIALYSIS/PERMA CATHETER INSERTION N/A 04/10/2021   Procedure: DIALYSIS/PERMA CATHETER INSERTION;  Surgeon: Algernon Huxley, MD;  Location: [place] CV LAB;  Service: Cardiovascular;  Laterality: N/A;   IR FLUORO GUIDE CV LINE RIGHT  04/30/2021   PICC LINE INSERTION     TEE WITHOUT CARDIOVERSION  N/A 05/15/2021   Procedure: TRANSESOPHAGEAL ECHOCARDIOGRAM (TEE);  Surgeon: Jerline Pain, MD;  Location: [facility] ENDOSCOPY;  Service: Cardiovascular;  Laterality: N/A;   THYROID LOBECTOMY Right 07/15/2017   Procedure: RIGHT THYROID LOBECTOMY;  Surgeon: Armandina Gemma, MD;  Location: [place];  Service: General;  Laterality: Right;     reports that she has never smoked. She has never used smokeless tobacco. She reports that she does not drink  alcohol and does not use drugs.  Allergies  Allergen Reactions   Contrast Media [Iodinated Diagnostic Agents]     Other reaction(s): NO ALLERGY   Latex     Other reaction(s): NO ALLERGY   Shellfish-Derived Products     Other reaction(s): NO ALLERGY   Levemir [Insulin Detemir] Itching    Family History  Problem Relation Age of Onset   Cancer Brother        possible prostate cancer per her daughter   Breast cancer Neg Hx      Prior to Admission medications   Medication Sig Start Date End Date Taking? Authorizing Provider  aspirin EC 81 MG tablet Take 81 mg by mouth daily.    [provider]  atorvastatin (LIPITOR) 40 MG tablet Take 1 tablet (40 mg total) by mouth daily. 05/17/21 06/16/21  Darliss Cheney, MD  cholecalciferol (VITAMIN D) 1000 units tablet Take 1,000 Units by mouth daily.    [provider]  KEPPRA XR 500 MG 24 hr tablet Take 500 mg by mouth daily. 01/21/21   [provider]  latanoprost (XALATAN) 0.005 % ophthalmic solution Place 1 drop into both eyes at bedtime. 09/07/18   Coral Spikes, DO  letrozole [facility]) 2.5 MG tablet Take 1 tablet (2.5 mg total) by mouth daily. 02/16/19   Nicholas Lose, MD  levothyroxine (SYNTHROID, LEVOTHROID) 112 MCG tablet Take 1 tablet (112 mcg total) by mouth daily before breakfast. 09/07/18   Coral Spikes, DO  metoprolol succinate (TOPROL-XL) 25 MG 24 hr tablet Take 50 mg by mouth daily. 08/20/20   [provider]    Physical Exam: Vitals:   07/16/21 0215 07/16/21 0300 07/16/21 0315 07/16/21 0400  BP: 127/63 (!) 141/72  (!) 142/76  Pulse: 70 77 77 82  Resp:  18 18 12   Temp:      TempSrc:      SpO2: 100% 99% 100% 100%    Constitutional: Lethargic but arousable, unable to participate in conversation. Skin: no rashes, no lesions, poor skin turgor noted. Eyes: Pupils are equally reactive to light.  No evidence of scleral icterus or conjunctival pallor.  ENMT: Moist mucous membranes noted.  Posterior  pharynx clear of any exudate or lesions.   Neck: normal, supple, no masses, no thyromegaly.  No evidence of jugular venous distension.   Respiratory: clear to auscultation bilaterally, no wheezing, no crackles. Normal respiratory effort. No accessory muscle use.  Cardiovascular: Regular rate and rhythm, no murmurs / rubs / gallops. No extremity edema. 2+ pedal pulses. No carotid bruits.  Chest:   Nontender without crepitus or deformity.   Back:   Nontender without crepitus or deformity. Abdomen: Abdomen is soft and nontender.  No evidence of intra-abdominal masses.  Positive bowel sounds noted in all quadrants.   Musculoskeletal: No joint deformity upper and lower extremities. Good ROM, no contractures. Normal muscle tone.  Neurologic: Patient is extremely lethargic and minimally arousable to verbal stimuli.  She does respond to painful stimuli and does localize to pain.  Patient does move all 4 extremities  spontaneously.  Sensation is grossly intact.   Psychiatric: Unable to assess due to severe lethargy.  Currently does not seem to possess insight as to her current situation.   Labs on Admission: I have personally reviewed following labs and imaging studies -   CBC: Recent Labs  Lab 07/15/21 1747  WBC 8.6  NEUTROABS 7.0  HGB 9.8*  HCT 32.1*  MCV 95.8  PLT 413   Basic Metabolic Panel: Recent Labs  Lab 07/15/21 1747  NA 137  K 3.5  CL 102  CO2 26  GLUCOSE 109*  BUN 10  CREATININE 2.61*  CALCIUM 8.4*   GFR: CrCl cannot be calculated (Unknown ideal weight.). Liver Function Tests: No results for input(s): AST, ALT, ALKPHOS, BILITOT, PROT, ALBUMIN in the last 168 hours. No results for input(s): LIPASE, AMYLASE in the last 168 hours. No results for input(s): AMMONIA in the last 168 hours. Coagulation Profile: No results for input(s): INR, PROTIME in the last 168 hours. Cardiac Enzymes: No results for input(s): CKTOTAL, CKMB, CKMBINDEX, TROPONINI in the last 168 hours. BNP  (last 3 results) No results for input(s): PROBNP in the last 8760 hours. HbA1C: No results for input(s): HGBA1C in the last 72 hours. CBG: No results for input(s): GLUCAP in the last 168 hours. Lipid Profile: No results for input(s): CHOL, HDL, LDLCALC, TRIG, CHOLHDL, LDLDIRECT in the last 72 hours. Thyroid Function Tests: Recent Labs    07/15/21 1747  TSH 0.429  FREET4 1.37*   Anemia Panel: No results for input(s): VITAMINB12, FOLATE, FERRITIN, TIBC, IRON, RETICCTPCT in the last 72 hours. Urine analysis:    Component Value Date/Time   COLORURINE YELLOW 05/17/2021 0808   APPEARANCEUR CLEAR 05/17/2021 0808   LABSPEC 1.011 05/17/2021 0808   PHURINE 9.0 (H) 05/17/2021 0808   GLUCOSEU 50 (A) 05/17/2021 0808   HGBUR NEGATIVE 05/17/2021 0808   BILIRUBINUR NEGATIVE 05/17/2021 0808   KETONESUR 5 (A) 05/17/2021 0808   PROTEINUR >=300 (A) 05/17/2021 0808   NITRITE NEGATIVE 05/17/2021 0808   LEUKOCYTESUR NEGATIVE 05/17/2021 0808    Radiological Exams on Admission - Personally Reviewed: No results found.  EKG: Personally reviewed.  Rhythm is normal sinus rhythm with heart rate of 94 bpm.  No dynamic ST segment changes appreciated.  Assessment/Plan Principal Problem: Breakthrough seizure   Patient exhibiting an episode of seizure-like activity during hemodialysis on 8/10 This likely being precipitated by the fact the patient was unable to take her Keppra the morning of 8/10 followed by hemodialysis further lowering the patient's Keppra levels I discussed the case with Dr. Curly Shores with neurology, her input is appreciated.  He recommends continuing Keppra 500 mg daily but additionally adding a 250 mg post dialysis dose to minimize sudden drop in levels and hopefully minimizing the risk of further seizures of this nature. Will additionally monitor patient overnight as patient is still quite lethargic after her recent seizure and Keppra load Seizure precautions Monitoring on  telemetry Monitoring electrolytes  Active Problems:   Essential hypertension  Continue home regimen of metoprolol    Hypothyroidism  Continue home regimen of levothyroxine    End-stage renal disease on hemodialysis ([place])  According to the patient's son, patient completed the majority of her hemodialysis session on 8/10. If patient remains in the hospital on 8/12, will consult nephrology for resumption of hemodialysis services Patient typically receives hemodialysis Monday Wednesday Friday.    Type 2 diabetes mellitus with ESRD (end-stage renal disease) ([place])  Accu-Cheks before every meal and nightly with slight scale  insulin Hemoglobin A1c 05/2021 5.6.   Code Status:  Full code Family Communication: Discussed plan of care with son via phone conversation.  Status is: Observation  The patient remains OBS appropriate and will d/c before 2 midnights.  Dispo: The patient is from: Home              Anticipated d/c is to: Home              Patient currently is not medically stable to d/c.   Difficult to place patient No        Vernelle Emerald MD [facility] Pager [phone]  If 7PM-7AM, please contact night-coverage [email or web address] Use universal [place] password for that web site. If you do not have the password, please call the hospital operator.  07/16/2021, 4:48 AM

## 2021-07-17 ENCOUNTER — Inpatient Hospital Stay (HOSPITAL_COMMUNITY): Payer: Medicare (Managed Care)

## 2021-07-17 DIAGNOSIS — R54 Age-related physical debility: Secondary | ICD-10-CM | POA: Diagnosis present

## 2021-07-17 DIAGNOSIS — G40919 Epilepsy, unspecified, intractable, without status epilepticus: Secondary | ICD-10-CM | POA: Diagnosis not present

## 2021-07-17 DIAGNOSIS — G9341 Metabolic encephalopathy: Secondary | ICD-10-CM | POA: Diagnosis present

## 2021-07-17 DIAGNOSIS — E1122 Type 2 diabetes mellitus with diabetic chronic kidney disease: Secondary | ICD-10-CM | POA: Diagnosis present

## 2021-07-17 DIAGNOSIS — D631 Anemia in chronic kidney disease: Secondary | ICD-10-CM | POA: Diagnosis present

## 2021-07-17 DIAGNOSIS — R569 Unspecified convulsions: Secondary | ICD-10-CM

## 2021-07-17 DIAGNOSIS — Z992 Dependence on renal dialysis: Secondary | ICD-10-CM | POA: Diagnosis not present

## 2021-07-17 DIAGNOSIS — H409 Unspecified glaucoma: Secondary | ICD-10-CM | POA: Diagnosis present

## 2021-07-17 DIAGNOSIS — F0151 Vascular dementia with behavioral disturbance: Secondary | ICD-10-CM | POA: Diagnosis not present

## 2021-07-17 DIAGNOSIS — G40909 Epilepsy, unspecified, not intractable, without status epilepticus: Secondary | ICD-10-CM | POA: Diagnosis present

## 2021-07-17 DIAGNOSIS — Z79890 Hormone replacement therapy (postmenopausal): Secondary | ICD-10-CM | POA: Diagnosis not present

## 2021-07-17 DIAGNOSIS — C50411 Malignant neoplasm of upper-outer quadrant of right female breast: Secondary | ICD-10-CM | POA: Diagnosis present

## 2021-07-17 DIAGNOSIS — E782 Mixed hyperlipidemia: Secondary | ICD-10-CM | POA: Diagnosis present

## 2021-07-17 DIAGNOSIS — E11649 Type 2 diabetes mellitus with hypoglycemia without coma: Secondary | ICD-10-CM | POA: Diagnosis not present

## 2021-07-17 DIAGNOSIS — F0390 Unspecified dementia without behavioral disturbance: Secondary | ICD-10-CM | POA: Diagnosis present

## 2021-07-17 DIAGNOSIS — R070 Pain in throat: Secondary | ICD-10-CM | POA: Diagnosis not present

## 2021-07-17 DIAGNOSIS — E039 Hypothyroidism, unspecified: Secondary | ICD-10-CM | POA: Diagnosis present

## 2021-07-17 DIAGNOSIS — Z7982 Long term (current) use of aspirin: Secondary | ICD-10-CM | POA: Diagnosis not present

## 2021-07-17 DIAGNOSIS — L89322 Pressure ulcer of left buttock, stage 2: Secondary | ICD-10-CM | POA: Diagnosis present

## 2021-07-17 DIAGNOSIS — Z7189 Other specified counseling: Secondary | ICD-10-CM | POA: Diagnosis not present

## 2021-07-17 DIAGNOSIS — Z515 Encounter for palliative care: Secondary | ICD-10-CM | POA: Diagnosis not present

## 2021-07-17 DIAGNOSIS — R4182 Altered mental status, unspecified: Secondary | ICD-10-CM | POA: Diagnosis not present

## 2021-07-17 DIAGNOSIS — L89312 Pressure ulcer of right buttock, stage 2: Secondary | ICD-10-CM | POA: Diagnosis present

## 2021-07-17 DIAGNOSIS — Z170 Estrogen receptor positive status [ER+]: Secondary | ICD-10-CM | POA: Diagnosis not present

## 2021-07-17 DIAGNOSIS — N2581 Secondary hyperparathyroidism of renal origin: Secondary | ICD-10-CM | POA: Diagnosis present

## 2021-07-17 DIAGNOSIS — F05 Delirium due to known physiological condition: Secondary | ICD-10-CM | POA: Diagnosis present

## 2021-07-17 DIAGNOSIS — E1169 Type 2 diabetes mellitus with other specified complication: Secondary | ICD-10-CM | POA: Diagnosis present

## 2021-07-17 DIAGNOSIS — Z20822 Contact with and (suspected) exposure to covid-19: Secondary | ICD-10-CM | POA: Diagnosis present

## 2021-07-17 DIAGNOSIS — N186 End stage renal disease: Secondary | ICD-10-CM | POA: Diagnosis present

## 2021-07-17 DIAGNOSIS — I120 Hypertensive chronic kidney disease with stage 5 chronic kidney disease or end stage renal disease: Secondary | ICD-10-CM | POA: Diagnosis present

## 2021-07-17 DIAGNOSIS — I69354 Hemiplegia and hemiparesis following cerebral infarction affecting left non-dominant side: Secondary | ICD-10-CM | POA: Diagnosis not present

## 2021-07-17 LAB — GLUCOSE, CAPILLARY
Glucose-Capillary: 101 mg/dL — ABNORMAL HIGH (ref 70–99)
Glucose-Capillary: 111 mg/dL — ABNORMAL HIGH (ref 70–99)
Glucose-Capillary: 113 mg/dL — ABNORMAL HIGH (ref 70–99)
Glucose-Capillary: 114 mg/dL — ABNORMAL HIGH (ref 70–99)
Glucose-Capillary: 69 mg/dL — ABNORMAL LOW (ref 70–99)
Glucose-Capillary: 76 mg/dL (ref 70–99)
Glucose-Capillary: 83 mg/dL (ref 70–99)
Glucose-Capillary: 84 mg/dL (ref 70–99)
Glucose-Capillary: 88 mg/dL (ref 70–99)
Glucose-Capillary: 89 mg/dL (ref 70–99)
Glucose-Capillary: 91 mg/dL (ref 70–99)
Glucose-Capillary: 92 mg/dL (ref 70–99)
Glucose-Capillary: 95 mg/dL (ref 70–99)
Glucose-Capillary: 97 mg/dL (ref 70–99)

## 2021-07-17 MED ORDER — VALPROATE SODIUM 100 MG/ML IV SOLN
1000.0000 mg | Freq: Once | INTRAVENOUS | Status: DC
  Administered 2021-07-17: 1000 mg via INTRAVENOUS
  Filled 2021-07-17: qty 10

## 2021-07-17 MED ORDER — VALPROATE SODIUM 100 MG/ML IV SOLN: 500.0000 mg | INTRAVENOUS | Status: DC

## 2021-07-17 MED ORDER — VALPROATE SODIUM 100 MG/ML IV SOLN: 1000.0000 mg | INTRAVENOUS | Status: AC

## 2021-07-17 MED ORDER — PROSOURCE PLUS PO LIQD
30.0000 mL | Freq: Three times a day (TID) | ORAL | Status: DC
  Administered 2021-07-21 – 2021-07-31 (×22): 30 mL via ORAL
  Filled 2021-07-17 (×27): qty 30

## 2021-07-17 MED ORDER — DIVALPROEX SODIUM 250 MG PO DR TAB: 500.0000 mg | DELAYED_RELEASE_TABLET | Freq: Two times a day (BID) | ORAL | Status: DC

## 2021-07-17 MED ORDER — LORAZEPAM 2 MG/ML IJ SOLN
2.0000 mg | INTRAMUSCULAR | Status: AC
  Administered 2021-07-17: 2 mg via INTRAVENOUS
  Filled 2021-07-17: qty 1

## 2021-07-17 NOTE — Progress Notes (Signed)
Neurology Progress Note  Brief HPI: 75 y.o. female with a PMHx of right MCA stroke with residual left hemiparesis, DM2, HTN, HLD, hypothyroidism, breast cancer in 2017, recent streptococcal bacteremia treated with Vancomycin in May 2022, with recent punctate strokes, ESRD on hemodialysis who presented to the ED 8/10 for evaluation of a breakthrough seizure in the setting of missed medication dose followed by hemodialysis further lowering serum Keppra concentration. Family with concern that patient is unable to tolerate Keppra and neurology was reconsulted for AED medication recommendations.  07/17/2021: Spoke with patient's son via telephone who states that since she has been taking Keppra, Ms. Pownall has become more agitated wanting to fight and not allowing others to touch her or help her. She has been aggressive and agitated and more forgetful than usual as well. Mr. Galas endorses that her agitation is out of character and believes that one of her medications is attributing to her personality change. Discussed with Mr. Gamino adverse effects of Keppra and he is agreeable to transitioning her AED medication at this time.   Subjective: No acute overnight events Patient is agitated during examination   Exam: Vitals:   07/17/21 0804 07/17/21 1217  BP: (!) 173/95 (!) 164/94  Pulse: 75 76  Resp: 20 20  Temp: (!) 97.5 F (36.4 C) 97.6 F (36.4 C)  SpO2: 100% 95%   Gen: Laying in bed, awake, in no acute distress Resp: non-labored breathing, no respiratory distress Abd: soft, non-distended  Neuro: Mental Status: Awake and alert to self.  When called Ms. Medlock she states "I am not Ms. Baumert, I am [employer]  She incorrectly states that she is 75 years old and states that she does not know the year, month, location, or situation. She is unable to provide a clear or coherent history of present illness.  Speech is intact with mild dysarthria  Patient does not attempt to  name any objects for examiner or repeat phrases Cranial Nerves: PERRL, she fixates and tracks examiner around room, face appears symmetric resting and with movement, hearing is intact to voice, phonation intact, does not protrude tongue to command.  Motor: Moves her right upper and lower extremities spontaneously. When asked to lift her extremities she states "no" adamantly. When asked if examiner can help her with extremity movement she states "no" adamantly.  Tone and bilk are normal.  Sensory: When asked about sensation to light touch she states she can feel light touch on bilateral lower extremities then states that the sensation does not feel the same but will not elaborate.  Gait: Deferred  Pertinent Labs: CBC    Component Value Date/Time   WBC 6.6 07/16/2021 0530   RBC 3.23 (L) 07/16/2021 0530   HGB 9.4 (L) 07/16/2021 0530   HCT 31.4 (L) 07/16/2021 0530   PLT 173 07/16/2021 0530   MCV 97.2 07/16/2021 0530   MCH 29.1 07/16/2021 0530   MCHC 29.9 (L) 07/16/2021 0530   RDW 15.9 (H) 07/16/2021 0530   LYMPHSABS 1.8 07/16/2021 0530   MONOABS 0.8 07/16/2021 0530   EOSABS 0.2 07/16/2021 0530   BASOSABS 0.1 07/16/2021 0530   CMP     Component Value Date/Time   NA 138 07/16/2021 0530   K 3.8 07/16/2021 0530   CL 103 07/16/2021 0530   CO2 27 07/16/2021 0530   GLUCOSE 85 07/16/2021 0530   BUN 13 07/16/2021 0530   CREATININE 3.52 (H) 07/16/2021 0530   CALCIUM 8.5 (L) 07/16/2021 0530   PROT 6.1 (L) 07/16/2021  0530   ALBUMIN 2.6 (L) 07/16/2021 0530   AST 23 07/16/2021 0530   ALT 22 07/16/2021 0530   ALKPHOS 60 07/16/2021 0530   BILITOT 0.5 07/16/2021 0530   GFRNONAA 13 (L) 07/16/2021 0530   GFRAA 34 (L) 08/01/2018 1056   Imaging Reviewed:  CT Head without contrast 07/16/2021: 1. No acute intracranial abnormality. 2. Stable appearance of prior right craniectomy and advanced underlying chronic ischemic disease  EEG 07/17/2021: "This study is suggestive of cortical dysfunction  in right frontal region consistent with prior craniotomy.  Additionally there is evidence of moderate diffuse encephalopathy, nonspecific etiology.  No seizures or definite epileptiform discharges were seen throughout the recording."  Assessment: 75 y.o. female with new onset seizures diagnosed in February 2022 with EEG demonstrating bilateral frontotemporal spikes s/p Keppra initiation. On 07/15/2021, she missed her morning dose of Keppra and had a breakthrough seizure while in hemodialysis.  - Per patient's son via telephone, since Ms. Zeien has been taking Keppra, she has become more agitated wanting to fight others and she has not been allowing others to touch her or help her. She has been aggressive, agitated, and more forgetful than usual as well. Mr. Mira endorses that her agitation is out of character and believes that one of her medications is attributing to her personality change.  - Examination reveals patient who seems upset and does not participate with assessment who is oriented to self only within the limitation of patient participation with examination.  - EEG 07/17/2021 is without evidence of seizures or epileptiform discharges. There is evidence of right frontal region cortical dysfunction consistent with prior craniotomy with further evidence supporting moderate diffuse encephalopathy of nonspecific etiology.  - As agitation and aggression is a known side effect of Keppra, it is favored to transition Ms. Vollman off of Keppra. Due to agitation with Keppra and history of ESRD on hemodialysis, I would favor transition to Depakote at this time.  Impression:  Breakthrough seizure in the setting of missed medication Agitation as a side effect of Keppra therapy   Recommendations:  - Favor transition to Depakote - Depakote 1,000 mg IV load with 500 mg PO BID maintenance dosing  - Discontinue Keppra due to patient aggression and agitation - Follow up with outpatient  neurology - Continue seizure precautions   Anibal Henderson, AGACNP-BC [facility] [phone]    Attending addendum Patient seen and examined Agree with history and physical above Agree with changing antiepileptic to Depakote.  Will load and start maintenance dose today.  We will discontinue Keppra from tonight's dose. Keppra could be contributing to the agitation.   -- Amie Portland, MD Neurologist [facility] Pager: [phone]

## 2021-07-17 NOTE — Progress Notes (Signed)
Lab attempted to get AM labs while I held the pt's arm. Pt too combative. Unable to do labs. Pt also wouldn't take AM Synthroid

## 2021-07-17 NOTE — Progress Notes (Signed)
EEG tech was unable to wash patient's head after EEG; patient became abusive and would hit each time tech came near.

## 2021-07-17 NOTE — Progress Notes (Signed)
Aggressive, combative, attempting to hit staff with right arm, fussing and cursing . Unable to control and keep safe. Contacted provider on call. Orders obtained and implemented

## 2021-07-17 NOTE — Consult Note (Signed)
[facility] Renal Consultation Note    Indication for Consultation:  Management of ESRD/hemodialysis; anemia, hypertension/volume and secondary hyperparathyroidism  [id number]  HPI: Kirsten Mcmahon is a 75 y.o. female with ESRD on HD MWF at [facility]. Her past medical history significant for seizure disorder-dx 01/2021, CVAs (right MCA in '98 and recent multifocal CVAs in June 2022), T2DM, and strep bacteremia (tx'd with IV vanc on HD in 04/2021). Patient presented to the ED after experiencing a breakthrough seizure at her outpatient HD center on 07/15/2021. Outpatient HD Charge RN informed me that she ran out of her Keppra medication. Neurology currently on board. Plan for HD later this evening.   Past Medical History:  Diagnosis Date   Cancer [facility]) 2017   Right breast   Depression    GERD (gastroesophageal reflux disease)    Glaucoma    Hemiparesis (HCC)    left side   High cholesterol    History of seizure    x 1 - after a spider bite   History of stroke with residual deficit    left-side weakness   Hypertension    states BP under control with meds., has been on med. x 2 yr.   Hypothyroidism    Non-insulin dependent type 2 diabetes mellitus (Normal)    Overactive bladder    Stroke [facility])    1998 weakness on left side   Past Surgical History:  Procedure Laterality Date   ABDOMINAL HYSTERECTOMY     complete   BREAST BIOPSY Left 08/03/2018   Benign adipose tissue   BREAST EXCISIONAL BIOPSY Right 2014   Positive   BREAST LUMPECTOMY Right    BUBBLE STUDY  05/15/2021   Procedure: BUBBLE STUDY;  Surgeon: Jerline Pain, MD;  Location: [place] [facility];  Service: Cardiovascular;;   CATARACT EXTRACTION W/ INTRAOCULAR LENS IMPLANT Left    CEREBRAL ANEURYSM REPAIR  1998   DIALYSIS/PERMA CATHETER INSERTION N/A 04/10/2021   Procedure: DIALYSIS/PERMA CATHETER INSERTION;  Surgeon: Algernon Huxley, MD;  Location: [place] CV LAB;  Service:  Cardiovascular;  Laterality: N/A;   IR FLUORO GUIDE CV LINE RIGHT  04/30/2021   PICC LINE INSERTION     TEE WITHOUT CARDIOVERSION N/A 05/15/2021   Procedure: TRANSESOPHAGEAL ECHOCARDIOGRAM (TEE);  Surgeon: Jerline Pain, MD;  Location: [facility] ENDOSCOPY;  Service: Cardiovascular;  Laterality: N/A;   THYROID LOBECTOMY Right 07/15/2017   Procedure: RIGHT THYROID LOBECTOMY;  Surgeon: Armandina Gemma, MD;  Location: [place];  Service: General;  Laterality: Right;   Family History  Problem Relation Age of Onset   Cancer Brother        possible prostate cancer per her daughter   Breast cancer Neg Hx    Social History:  reports that she has never smoked. She has never used smokeless tobacco. She reports that she does not drink alcohol and does not use drugs. Allergies  Allergen Reactions   Contrast Media [Iodinated Diagnostic Agents]     Other reaction(s): NO ALLERGY   Latex     Other reaction(s): NO ALLERGY   Shellfish-Derived Products     Other reaction(s): NO ALLERGY   Levemir [Insulin Detemir] Itching   Prior to Admission medications   Medication Sig Start Date End Date Taking? Authorizing Provider  aspirin EC 81 MG tablet Take 81 mg by mouth daily.   Yes [provider]  latanoprost (XALATAN) 0.005 % ophthalmic solution Place 1 drop into both eyes at bedtime. 09/07/18  Yes Lacinda Axon,  Jayce G, DO  letrozole (FEMARA) 2.5 MG tablet Take 1 tablet (2.5 mg total) by mouth daily. 02/16/19  Yes Nicholas Lose, MD  levETIRAcetam (KEPPRA) 500 MG tablet Take 500 mg by mouth daily. 06/16/21  Yes [provider]  levothyroxine (SYNTHROID, LEVOTHROID) 112 MCG tablet Take 1 tablet (112 mcg total) by mouth daily before breakfast. 09/07/18  Yes Cook, Jayce G, DO  metoprolol succinate (TOPROL-XL) 25 MG 24 hr tablet Take 50 mg by mouth daily. 08/20/20  Yes [provider]   Current Facility-Administered Medications  Medication Dose Route Frequency Provider Last Rate Last Admin   acetaminophen  (TYLENOL) tablet 650 mg  650 mg Oral Q6H PRN Shalhoub, Sherryll Burger, MD       Or   acetaminophen (TYLENOL) suppository 650 mg  650 mg Rectal Q6H PRN Shalhoub, Sherryll Burger, MD       aspirin EC tablet 81 mg  81 mg Oral Daily Shalhoub, Sherryll Burger, MD       calcitRIOL (ROCALTROL) capsule 0.5 mcg  0.5 mcg Oral Q M,W,F-HD Valentina Gu, NP       Chlorhexidine Gluconate Cloth 2 % PADS 6 each  6 each Topical Q0600 Valentina Gu, NP       dextrose 10 % infusion   Intravenous Continuous Shelly Coss, MD 50 mL/hr at 07/16/21 1702 New Bag at 07/16/21 1702   divalproex (DEPAKOTE) DR tablet 500 mg  500 mg Oral Q12H Rikki Spearing, NP       heparin injection 5,000 Units  5,000 Units Subcutaneous Q8H Shalhoub, Sherryll Burger, MD   5,000 Units at 07/17/21 1413   insulin aspart (novoLOG) injection 0-6 Units  0-6 Units Subcutaneous TID AC & HS Shalhoub, Sherryll Burger, MD       latanoprost (XALATAN) 0.005 % ophthalmic solution 1 drop  1 drop Both Eyes QHS Shalhoub, Sherryll Burger, MD       letrozole [facility]) tablet 2.5 mg  2.5 mg Oral Daily Shalhoub, Sherryll Burger, MD       levothyroxine (SYNTHROID) tablet 112 mcg  112 mcg Oral Q0600 Vernelle Emerald, MD   112 mcg at 07/16/21 0547   metoprolol succinate (TOPROL-XL) 24 hr tablet 50 mg  50 mg Oral Daily Shalhoub, Sherryll Burger, MD       ondansetron [facility]) tablet 4 mg  4 mg Oral Q6H PRN Shalhoub, Sherryll Burger, MD       Or   ondansetron [facility]) injection 4 mg  4 mg Intravenous Q6H PRN Shalhoub, Sherryll Burger, MD       polyethylene glycol (MIRALAX / GLYCOLAX) packet 17 g  17 g Oral Daily PRN Shalhoub, Sherryll Burger, MD       valproate (DEPACON) 1,000 mg in dextrose 5 % 50 mL IVPB  1,000 mg Intravenous Once Rikki Spearing, NP       Labs: Basic Metabolic Panel: Recent Labs  Lab 07/15/21 1747 07/16/21 0530  NA 137 138  K 3.5 3.8  CL 102 103  CO2 26 27  GLUCOSE 109* 85  BUN 10 13  CREATININE 2.61* 3.52*  CALCIUM 8.4* 8.5*   Liver Function Tests: Recent Labs  Lab 07/16/21 0530   AST 23  ALT 22  ALKPHOS 60  BILITOT 0.5  PROT 6.1*  ALBUMIN 2.6*   No results for input(s): LIPASE, AMYLASE in the last 168 hours. No results for input(s): AMMONIA in the last 168 hours. CBC: Recent Labs  Lab 07/15/21 1747 07/16/21 0530  WBC 8.6 6.6  NEUTROABS 7.0 3.8  HGB 9.8* 9.4*  HCT 32.1* 31.4*  MCV 95.8 97.2  PLT 156 173   Cardiac Enzymes: No results for input(s): CKTOTAL, CKMB, CKMBINDEX, TROPONINI in the last 168 hours. CBG: Recent Labs  Lab 07/17/21 1013 07/17/21 1217 07/17/21 1411 07/17/21 1642 07/17/21 1706  GLUCAP 111* 101* 91 69* 84   Iron Studies: No results for input(s): IRON, TIBC, TRANSFERRIN, FERRITIN in the last 72 hours. Studies/Results: CT HEAD WO CONTRAST (5MM)  Result Date: 07/16/2021 CLINICAL DATA:  75 year old female with witnessed seizure at dialysis. EXAM: CT HEAD WITHOUT CONTRAST TECHNIQUE: Contiguous axial images were obtained from the base of the skull through the vertex without intravenous contrast. COMPARISON:  Brain MRI 05/12/2021.  Head CT 05/17/2021. FINDINGS: Brain: Advanced chronic ischemic disease, including confluent chronic right MCA and right PCA territory infarcts with encephalomalacia. Right side brainstem Wallerian degeneration. Small chronic infarcts in the bilateral thalami and cerebellum. Patchy and confluent bilateral cerebral white matter hypodensity. Stable gray-white matter differentiation throughout the brain. No midline shift, ventriculomegaly, mass effect, evidence of mass lesion, intracranial hemorrhage or evidence of cortically based acute infarction. Vascular: Calcified atherosclerosis at the skull base. No suspicious intracranial vascular hyperdensity. Skull: Chronic right hemi craniectomy. Stable visualized osseous structures. Sinuses/Orbits: Visualized paranasal sinuses and mastoids are stable and well aerated. Other: No acute orbit or scalp soft tissue finding. Postoperative changes to the left globe, and right scalp.  IMPRESSION: 1. No acute intracranial abnormality. 2. Stable appearance of prior right craniectomy and advanced underlying chronic ischemic disease. Electronically Signed   By: Genevie Ann M.D.   On: 07/16/2021 05:43   EEG adult  Result Date: 07/17/2021 Lora Havens, MD     07/17/2021  2:26 PM Patient Name: Kirsten Mcmahon MRN: [id number] Epilepsy Attending: Lora Havens Referring Physician/Provider: Dr Shelly Coss Date: 07/17/2021 Duration: 23.53 mins Patient history: 75 year old female with history of epilepsy who presented with breakthrough seizure as well as altered mental status.  EEG to evaluate for seizures. Level of alertness: Awake, asleep AEDs during EEG study: LEV Technical aspects: This EEG study was done with scalp electrodes positioned according to the 10-20 International system of electrode placement. Electrical activity was acquired at a sampling rate of 500Hz  and reviewed with a high frequency filter of 70Hz  and a low frequency filter of 1Hz . EEG data were recorded continuously and digitally stored. Description: No posterior dominant rhythm was seen. Sleep was characterized by vertex waves, sleep spindles (12 to 14 Hz), maximal frontocentral region.  EEG showed continuous generalized polymorphic 3 to 6 Hz theta-delta slowing. There is also sharply contoured 3 to 5 Hz theta-delta slowing in right frontal region consistent with underlying breech artifact.  Hyperventilation and photic stimulation were not performed.   ABNORMALITY - Continuous slow, generalized - Breach artifact, right frontal region  IMPRESSION: This study is suggestive of cortical dysfunction in right frontal region consistent with prior craniotomy.  Additionally there is evidence of moderate diffuse encephalopathy, nonspecific etiology.  No seizures or definite epileptiform discharges were seen throughout the recording. Lora Havens    Physical Exam: Vitals:   07/16/21 2053 07/16/21 2300 07/17/21 0804 07/17/21 1217   BP: (!) 131/94 (!) 157/98 (!) 173/95 (!) 164/94  Pulse: 81 74 75 76  Resp: 20 18 20 20   Temp: 98.1 F (36.7 C) 97.9 F (36.6 C) (!) 97.5 F (36.4 C) 97.6 F (36.4 C)  TempSrc: Axillary Axillary Oral Oral  SpO2: 99% 95% 100% 95%  General: Ill-appearing; NAD Neck: No evidence for JVD Lungs: CTA bilaterally. No wheeze, rales or rhonchi. Breathing is unlabored. Heart: RRR. No murmur, rubs or gallops.  Abdomen: soft, nontender, +BS Lower extremities:no edema BLLE Neuro: Appears lethargic-opens eyes only, Moves all extremities spontaneously. Dialysis Access: [facility]  Dialysis Orders:  HD Orders: [name] MWF 3:45 hr 180 NRE 400/Autoflow 1.5 56 kg 3.0 K/2.5 Ca TDC  -No Heparin -Mircera 150 mcg IV Q 2 weeks (last dose 07/15/2021) -Venofer 50 mg IV weekly (last dose 07/15/2021) -Calcitriol 0.5 mcg PO TIW  Last Labs: Hgb 9.4, K 3.8, Ca 8.5, Alb 2.6  Assessment/Plan: Seizure-experienced a seizure during her last outpatient HD on 07/15/21. No seizures noted during hospitalization so far. Neurology following and EEG ordered. Altered Mental Status-Currently lethargic; CT head (-) acute intracranial abnormalities. Continue delirium precautions. ESRD - on HD MWF. Plan for HD this evening. UF as tolerated. Hypertension/volume  - Patient euvolemic on exam, noted BP elevation-continue HTN medication-monitor BP and HR. Anemia of CKD - Hgb 9.4-recently received ESA and Fe in outpatient-monitor trends-may have to resume here. Secondary Hyperparathyroidism -  Ca 8.5-continue PO calcitriol. Will order RFP in AM. Nutrition - Continue carb-modified renal diet. Albumin 2.6-will add protein supplements.  Tobie Poet, NP [facility] 07/17/2021, 5:18 PM

## 2021-07-17 NOTE — Progress Notes (Signed)
EEG Completed; Results Pending  

## 2021-07-17 NOTE — Procedures (Signed)
Patient Name: Kirsten Mcmahon  MRN: [id number]  Epilepsy Attending: Lora Havens  Referring Physician/Provider: Dr Shelly Coss Date: 07/17/2021 Duration: 23.53 mins  Patient history: 75 year old female with history of epilepsy who presented with breakthrough seizure as well as altered mental status.  EEG to evaluate for seizures.  Level of alertness: Awake, asleep  AEDs during EEG study: LEV  Technical aspects: This EEG study was done with scalp electrodes positioned according to the 10-20 International system of electrode placement. Electrical activity was acquired at a sampling rate of 500Hz  and reviewed with a high frequency filter of 70Hz  and a low frequency filter of 1Hz . EEG data were recorded continuously and digitally stored.   Description: No posterior dominant rhythm was seen. Sleep was characterized by vertex waves, sleep spindles (12 to 14 Hz), maximal frontocentral region.  EEG showed continuous generalized polymorphic 3 to 6 Hz theta-delta slowing. There is also sharply contoured 3 to 5 Hz theta-delta slowing in right frontal region consistent with underlying breech artifact.  Hyperventilation and photic stimulation were not performed.     ABNORMALITY - Continuous slow, generalized - Breach artifact, right frontal region   IMPRESSION: This study is suggestive of cortical dysfunction in right frontal region consistent with prior craniotomy.  Additionally there is evidence of moderate diffuse encephalopathy, nonspecific etiology.  No seizures or definite epileptiform discharges were seen throughout the recording.  [name] [name]

## 2021-07-17 NOTE — Progress Notes (Signed)
Pt found laying in bed coughing forcefully and vomiting when RN arrived to room for shift assessment. Head of bed raised and oral suction provided to assist patient with airway clearance. Pt very agitated and combative and currently disoriented x 4. Unable to complete a full assessment at this time due to patient vomiting and combative behavior. PRN zofran admin IV per order, patient cleaned and repositioned. Pt later reported she felt better and was noted to be calmer. Pt not cooperating at this time to follow commands but no movement was noted in upper or lower left extremities.

## 2021-07-17 NOTE — Progress Notes (Signed)
Pt has been combative and yelling out all shift whenever she is awake and anytime somebody touches her.

## 2021-07-17 NOTE — Plan of Care (Signed)
Pt with BUE mitts intact. Pt hits at staff when doing things for her (turning, accu checks, meds). Pt can be calmed down slightly when talking about her family. Accu checks done every 2 hours per orders. D10 IVF continue to infuse. Pt follows directions most of the time.

## 2021-07-17 NOTE — Progress Notes (Signed)
PROGRESS NOTE    Kirsten Mcmahon  OEU:[id number] DOB: 19-Jun-1946 DOA: 07/15/2021 PCP: [place]   Chief Complain:Seizure  Brief Narrative: Patient is a 75 year old female with history of ESRD on dialysis on Monday Wednesday Friday, history of breast cancer, history of stroke, diabetes type 2, seizure disorder who was brought to the emergency department after she had seizure-like activity during hemodialysis.  As per the report, patient ran out of her [place] on 8/9.  Patient had 30 minutes left in her hemodialysis session, she started to have seizure-like activity.  Hemodialysis was stopped and she was brought to the emergency department.  She was postictal on presentation, lethargic, disoriented.  CT head did not show any acute intracranial normalities.  Neurology was consulted who recommended to continue Keppra 500 mg daily and 250 mg postdialysis.  Patient is still confused and more agitated.  As per discussion with the son, she cannot tolerate Keppra.  We reconsulted neurology.  Nephrology also following for dialysis.  Assessment & Plan:   Principal Problem:   Breakthrough seizure ([place]) Active Problems:   Essential hypertension   Hypothyroidism   Type 2 diabetes mellitus with ESRD (end-stage renal disease) (HCC)   End-stage renal disease on hemodialysis ([place])   Mixed diabetic hyperlipidemia associated with type 2 diabetes mellitus ([place])   Altered mental status/agitation: Patient is a still confused.  Alert and oriented at baseline.  Son thinks that she is intolerant to [place] and has been skipping this medicine at home.  CT head on presentation did not show any acute intracranial abnormalities.  We have reconsulted neurology.  Continue delirium precaution.  Seizure: Had seizure activity while at dialysis.  She recently ran out of [place] on 8/9.  No seizure observed during this hospitalization.  We may need to consider doing an EEG here.  Neurology  consulted.  Hypoglycemia: Unknown etiology. Not on any meds at home. Hemoglobin A1c of 5.6 as per 6/22.  Was started on gentle D10.  Currently blood sugars are more stable  ESRD on dialysis: Has tunneled dialysis catheter on the right chest.  Nephrology consulted and following for dialysis.  History of hypertension: Continue current medication.  Monitor blood pressure  Hypothyroidism: Continue levothyroxine          DVT prophylaxis:Heparin [place] Code Status: Full Family Communication: Discussed with son on phone    Dispo: The patient is from: Home              Anticipated d/c is to: Home              Patient currently is not medically stable to d/c.   Difficult to place patient No     Consultants: neurology  Procedures:None  Antimicrobials:  Anti-infectives (From admission, onward)    None       Subjective:  Patient seen and examined the bedside this morning.  Hemodynamically stable.  She was agitated, confused last night.  She was still confused during my evaluation and did not even open her eyes when calling her name or tapping her.  She was not given any sedating medications last night.   Objective: Vitals:   07/16/21 1700 07/16/21 2053 07/16/21 2300 07/17/21 0804  BP: (!) 169/86 (!) 131/94 (!) 157/98 (!) 173/95  Pulse: 79 81 74 75  Resp: 20 20 18 20   Temp:  98.1 F (36.7 C) 97.9 F (36.6 C) (!) 97.5 F (36.4 C)  TempSrc:  Axillary Axillary Oral  SpO2: 100% 99% 95% 100%  Intake/Output Summary (Last 24 hours) at 07/17/2021 1128 Last data filed at 07/16/2021 1655 Gross per 24 hour  Intake 169.5 ml  Output --  Net 169.5 ml   There were no vitals filed for this visit.  Examination:  General exam: Deconditioned, chronically ill looking, eyes closed HEENT: PERRL Respiratory system:  no wheezes or crackles  Cardiovascular system: S1 & S2 heard, RRR.  Temporary dialysis catheter on the right chest Gastrointestinal system: Abdomen is nondistended,  soft and nontender. Central nervous system: Not Alert and oriented Extremities: No edema, no clubbing ,no cyanosis Skin: No rashes, no ulcers,no icterus  .     Data Reviewed: I have personally reviewed following labs and imaging studies  CBC: Recent Labs  Lab 07/15/21 1747 07/16/21 0530  WBC 8.6 6.6  NEUTROABS 7.0 3.8  HGB 9.8* 9.4*  HCT 32.1* 31.4*  MCV 95.8 97.2  PLT 156 626   Basic Metabolic Panel: Recent Labs  Lab 07/15/21 1747 07/16/21 0530  NA 137 138  K 3.5 3.8  CL 102 103  CO2 26 27  GLUCOSE 109* 85  BUN 10 13  CREATININE 2.61* 3.52*  CALCIUM 8.4* 8.5*  MG  --  2.0   GFR: CrCl cannot be calculated (Unknown ideal weight.). Liver Function Tests: Recent Labs  Lab 07/16/21 0530  AST 23  ALT 22  ALKPHOS 60  BILITOT 0.5  PROT 6.1*  ALBUMIN 2.6*   No results for input(s): LIPASE, AMYLASE in the last 168 hours. No results for input(s): AMMONIA in the last 168 hours. Coagulation Profile: No results for input(s): INR, PROTIME in the last 168 hours. Cardiac Enzymes: No results for input(s): CKTOTAL, CKMB, CKMBINDEX, TROPONINI in the last 168 hours. BNP (last 3 results) No results for input(s): PROBNP in the last 8760 hours. HbA1C: No results for input(s): HGBA1C in the last 72 hours. CBG: Recent Labs  Lab 07/17/21 0217 07/17/21 0439 07/17/21 0628 07/17/21 0805 07/17/21 1013  GLUCAP 97 88 92 83 111*   Lipid Profile: No results for input(s): CHOL, HDL, LDLCALC, TRIG, CHOLHDL, LDLDIRECT in the last 72 hours. Thyroid Function Tests: Recent Labs    07/15/21 1747  TSH 0.429  FREET4 1.37*   Anemia Panel: No results for input(s): VITAMINB12, FOLATE, FERRITIN, TIBC, IRON, RETICCTPCT in the last 72 hours. Sepsis Labs: No results for input(s): PROCALCITON, LATICACIDVEN in the last 168 hours.  Recent Results (from the past 240 hour(s))  Resp Panel by RT-PCR (Flu A&B, Covid) Nasopharyngeal Swab     Status: None   Collection Time: 07/15/21  5:50 PM    Specimen: Nasopharyngeal Swab; Nasopharyngeal(NP) swabs in vial transport medium  Result Value Ref Range Status   SARS Coronavirus 2 by RT PCR NEGATIVE NEGATIVE Final    Comment: (NOTE) SARS-CoV-2 target nucleic acids are NOT DETECTED.  The SARS-CoV-2 RNA is generally detectable in upper respiratory specimens during the acute phase of infection. The lowest concentration of SARS-CoV-2 viral copies this assay can detect is 138 copies/mL. A negative result does not preclude SARS-Cov-2 infection and should not be used as the sole basis for treatment or other patient management decisions. A negative result may occur with  improper specimen collection/handling, submission of specimen other than nasopharyngeal swab, presence of viral mutation(s) within the areas targeted by this assay, and inadequate number of viral copies(<138 copies/mL). A negative result must be combined with clinical observations, patient history, and epidemiological information. The expected result is Negative.  Fact Sheet for Patients:  EntrepreneurPulse.com.au  Fact Sheet  for Healthcare Providers:  IncredibleEmployment.be  This test is no t yet approved or cleared by the [place] and  has been authorized for detection and/or diagnosis of SARS-CoV-2 by FDA under an Emergency Use Authorization (EUA). This EUA will remain  in effect (meaning this test can be used) for the duration of the COVID-19 declaration under Section 564(b)(1) of the Act, 21 U.S.C.section 360bbb-3(b)(1), unless the authorization is terminated  or revoked sooner.       Influenza A by PCR NEGATIVE NEGATIVE Final   Influenza B by PCR NEGATIVE NEGATIVE Final    Comment: (NOTE) The Xpert Xpress SARS-CoV-2/FLU/RSV plus assay is intended as an aid in the diagnosis of influenza from Nasopharyngeal swab specimens and should not be used as a sole basis for treatment. Nasal washings and aspirates are  unacceptable for Xpert Xpress SARS-CoV-2/FLU/RSV testing.  Fact Sheet for Patients: EntrepreneurPulse.com.au  Fact Sheet for Healthcare Providers: IncredibleEmployment.be  This test is not yet approved or cleared by the [place] FDA and has been authorized for detection and/or diagnosis of SARS-CoV-2 by FDA under an Emergency Use Authorization (EUA). This EUA will remain in effect (meaning this test can be used) for the duration of the COVID-19 declaration under Section 564(b)(1) of the Act, 21 U.S.C. section 360bbb-3(b)(1), unless the authorization is terminated or revoked.  Performed at [facility], [place] [address], [place], [place] [address]          Radiology Studies: CT HEAD WO CONTRAST (5MM)  Result Date: 07/16/2021 CLINICAL DATA:  75 year old female with witnessed seizure at dialysis. EXAM: CT HEAD WITHOUT CONTRAST TECHNIQUE: Contiguous axial images were obtained from the base of the skull through the vertex without intravenous contrast. COMPARISON:  Brain MRI 05/12/2021.  Head CT 05/17/2021. FINDINGS: Brain: Advanced chronic ischemic disease, including confluent chronic right MCA and right PCA territory infarcts with encephalomalacia. Right side brainstem Wallerian degeneration. Small chronic infarcts in the bilateral thalami and cerebellum. Patchy and confluent bilateral cerebral white matter hypodensity. Stable gray-white matter differentiation throughout the brain. No midline shift, ventriculomegaly, mass effect, evidence of mass lesion, intracranial hemorrhage or evidence of cortically based acute infarction. Vascular: Calcified atherosclerosis at the skull base. No suspicious intracranial vascular hyperdensity. Skull: Chronic right hemi craniectomy. Stable visualized osseous structures. Sinuses/Orbits: Visualized paranasal sinuses and mastoids are stable and well aerated. Other: No acute orbit or scalp soft tissue finding.  Postoperative changes to the left globe, and right scalp. IMPRESSION: 1. No acute intracranial abnormality. 2. Stable appearance of prior right craniectomy and advanced underlying chronic ischemic disease. Electronically Signed   By: Genevie Ann M.D.   On: 07/16/2021 05:43        Scheduled Meds:  aspirin EC  81 mg Oral Daily   calcitRIOL  0.5 mcg Oral Q M,W,F-HD   Chlorhexidine Gluconate Cloth  6 each Topical Q0600   heparin  5,000 Units Subcutaneous Q8H   insulin aspart  0-6 Units Subcutaneous TID AC & HS   latanoprost  1 drop Both Eyes QHS   letrozole  2.5 mg Oral Daily   levETIRAcetam  250 mg Oral Q M,W,F-HD   levETIRAcetam  500 mg Oral Daily   levothyroxine  112 mcg Oral Q0600   metoprolol succinate  50 mg Oral Daily   Continuous Infusions:  dextrose 50 mL/hr at 07/16/21 1702     LOS: 0 days    Time spent: 35 mins.More than 50% of that time was spent in counseling and/or coordination of care.  Shelly Coss, MD [facility] P8/11/2021, 11:28 AM

## 2021-07-18 LAB — PHOSPHORUS: Phosphorus: 3.8 mg/dL (ref 2.5–4.6)

## 2021-07-18 LAB — HEPATITIS B SURFACE ANTIGEN: Hepatitis B Surface Ag: NONREACTIVE

## 2021-07-18 LAB — GLUCOSE, CAPILLARY
Glucose-Capillary: 102 mg/dL — ABNORMAL HIGH (ref 70–99)
Glucose-Capillary: 97 mg/dL (ref 70–99)
Glucose-Capillary: 71 mg/dL (ref 70–99)
Glucose-Capillary: 85 mg/dL (ref 70–99)

## 2021-07-18 MED ORDER — LORAZEPAM 1 MG PO TABS: 2.0000 mg | ORAL_TABLET | ORAL | Status: AC

## 2021-07-18 MED ORDER — VALPROATE SODIUM 100 MG/ML IV SOLN: 1000.0000 mg | INTRAVENOUS | Status: DC

## 2021-07-18 MED ORDER — VALPROATE SODIUM 100 MG/ML IV SOLN
500.0000 mg | Freq: Two times a day (BID) | INTRAVENOUS | Status: DC
  Administered 2021-07-18 – 2021-07-22 (×9): 500 mg via INTRAVENOUS
  Filled 2021-07-18 (×10): qty 5

## 2021-07-18 MED ORDER — HEPARIN SODIUM (PORCINE) 1000 UNIT/ML IJ SOLN
INTRAMUSCULAR | Status: AC
  Filled 2021-07-18: qty 4

## 2021-07-18 MED ORDER — LORAZEPAM 2 MG/ML IJ SOLN
2.0000 mg | INTRAMUSCULAR | Status: AC
  Administered 2021-07-18: 2 mg via INTRAMUSCULAR
  Filled 2021-07-18: qty 1

## 2021-07-18 NOTE — Progress Notes (Signed)
[facility] Progress Note   Subjective:    Seen and examined patient at bedside. Patient was sleeping. Only opened eyes to voice. Appears comfortable. Tolerated yesterday's HD with net UF 600cc. Plan for HD 07/20/21 per usual schedule.  Objective Vitals:   07/18/21 0600 07/18/21 0755 07/18/21 0757 07/18/21 1155  BP: 121/87 140/84 140/84 124/87  Pulse:   95 79  Resp: 20  16 18   Temp: 98.4 F (36.9 C)  98.2 F (36.8 C) 97.9 F (36.6 C)  TempSrc: Oral  Oral Oral  SpO2: 96%  99% 100%  Weight: 55 kg      Physical Exam General: Ill-appearing; NAD Neck: No evidence for JVD Lungs: CTA bilaterally. No wheeze, rales or rhonchi. Breathing is unlabored. Heart: RRR. No murmur, rubs or gallops.  Abdomen: soft, nontender, +BS Lower extremities:no edema BLLE Neuro: Appears lethargic-opens eyes only, Moves all extremities spontaneously. Dialysis Access: [facility]  Filed Weights   07/18/21 0144 07/18/21 0600  Weight: 55.3 kg 55 kg    Intake/Output Summary (Last 24 hours) at 07/18/2021 1354 Last data filed at 07/18/2021 0700 Gross per 24 hour  Intake 600 ml  Output 600 ml  Net 0 ml    Additional Objective Labs: Basic Metabolic Panel: Recent Labs  Lab 07/15/21 1747 07/16/21 0530 07/18/21 0435  NA 137 138  --   K 3.5 3.8  --   CL 102 103  --   CO2 26 27  --   GLUCOSE 109* 85  --   BUN 10 13  --   CREATININE 2.61* 3.52*  --   CALCIUM 8.4* 8.5*  --   PHOS  --   --  3.8   Liver Function Tests: Recent Labs  Lab 07/16/21 0530  AST 23  ALT 22  ALKPHOS 60  BILITOT 0.5  PROT 6.1*  ALBUMIN 2.6*   No results for input(s): LIPASE, AMYLASE in the last 168 hours. CBC: Recent Labs  Lab 07/15/21 1747 07/16/21 0530  WBC 8.6 6.6  NEUTROABS 7.0 3.8  HGB 9.8* 9.4*  HCT 32.1* 31.4*  MCV 95.8 97.2  PLT 156 173   Blood Culture    Component Value Date/Time   SDES BLOOD LEFT FOREARM 05/17/2021 0953   SPECREQUEST  05/17/2021 0953    BOTTLES DRAWN AEROBIC AND  ANAEROBIC Blood Culture adequate volume   CULT  05/17/2021 0953    NO GROWTH 5 DAYS Performed at [facility], [place] [address], [place], [place] [address]    REPTSTATUS 05/22/2021 FINAL 05/17/2021 0953    Cardiac Enzymes: No results for input(s): CKTOTAL, CKMB, CKMBINDEX, TROPONINI in the last 168 hours. CBG: Recent Labs  Lab 07/17/21 2031 07/17/21 2214 07/17/21 2340 07/18/21 0752 07/18/21 1159  GLUCAP 76 113* 114* 102* 97   Iron Studies: No results for input(s): IRON, TIBC, TRANSFERRIN, FERRITIN in the last 72 hours. Lab Results  Component Value Date   INR 1.0 04/30/2021   Studies/Results: EEG adult  Result Date: 07/17/2021 Kirsten Havens, MD     07/17/2021  2:26 PM Patient Name: Kirsten Mcmahon MRN: [id number] Epilepsy Attending: Lora Mcmahon Referring Physician/Provider: Dr Kirsten Mcmahon Date: 07/17/2021 Duration: 23.53 mins Patient history: 75 year old female with history of epilepsy who presented with breakthrough seizure as well as altered mental status.  EEG to evaluate for seizures. Level of alertness: Awake, asleep AEDs during EEG study: LEV Technical aspects: This EEG study was done with scalp electrodes positioned according to the 10-20 International system of electrode  placement. Electrical activity was acquired at a sampling rate of 500Hz  and reviewed with a high frequency filter of 70Hz  and a low frequency filter of 1Hz . EEG data were recorded continuously and digitally stored. Description: No posterior dominant rhythm was seen. Sleep was characterized by vertex waves, sleep spindles (12 to 14 Hz), maximal frontocentral region.  EEG showed continuous generalized polymorphic 3 to 6 Hz theta-delta slowing. There is also sharply contoured 3 to 5 Hz theta-delta slowing in right frontal region consistent with underlying breech artifact.  Hyperventilation and photic stimulation were not performed.   ABNORMALITY - Continuous slow, generalized - Breach artifact, right  frontal region  IMPRESSION: This study is suggestive of cortical dysfunction in right frontal region consistent with prior craniotomy.  Additionally there is evidence of moderate diffuse encephalopathy, nonspecific etiology.  No seizures or definite epileptiform discharges were seen throughout the recording. Kirsten Mcmahon    Medications:  dextrose 50 mL/hr at 07/17/21 2025   valproate sodium 500 mg (07/18/21 1329)    (feeding supplement) PROSource Plus  30 mL Oral TID BM   aspirin EC  81 mg Oral Daily   calcitRIOL  0.5 mcg Oral Q M,W,F-HD   Chlorhexidine Gluconate Cloth  6 each Topical Q0600   heparin  5,000 Units Subcutaneous Q8H   heparin sodium (porcine)       insulin aspart  0-6 Units Subcutaneous TID AC & HS   latanoprost  1 drop Both Eyes QHS   letrozole  2.5 mg Oral Daily   levothyroxine  112 mcg Oral Q0600   metoprolol succinate  50 mg Oral Daily    Dialysis Orders: HD Orders: Kirsten Mcmahon MWF 3:45 hr 180 NRE 400/[place] 1.5 56 kg 3.0 K/2.5 Ca TDC  -No Heparin -Mircera 150 mcg IV Q 2 weeks (last dose 07/15/2021) -Venofer 50 mg IV weekly (last dose 07/15/2021) -Calcitriol 0.5 mcg PO TIW  Assessment/Plan: Seizure-experienced a seizure during her last outpatient HD on 07/15/21. No seizures noted during hospitalization so far. Neurology following and EEG ordered. Altered Mental Status-Currently lethargic-opens eyes to voice; CT head (-) acute intracranial abnormalities. Continue delirium precautions. ESRD - on HD MWF. Tolerated yesterday's HD with net UF 600cc. Plan for HD 07/20/21 per usual schedule. UF as tolerated. Hypertension/volume  - Patient euvolemic on exam, Bps improving-continue HTN medication-monitor BP and HR. Anemia of CKD - Hgb 9.4-recently received ESA and Fe in outpatient-monitor trends-may have to resume here. CBC ordered in AM Secondary Hyperparathyroidism -  Ca 8.5-continue PO calcitriol. RFP ordered in AM Nutrition - Continue carb-modified renal diet.  Albumin 2.6-will add protein supplements.   Kirsten Poet, NP [facility] 07/18/2021,1:54 PM  LOS: 1 day

## 2021-07-18 NOTE — Plan of Care (Signed)
  Problem: Education: Goal: Knowledge of General Education information will improve Description: Including pain rating scale, medication(s)/side effects and non-pharmacologic comfort measures Outcome: Progressing   Problem: Health Behavior/Discharge Planning: Goal: Ability to manage health-related needs will improve Outcome: Progressing   Problem: Clinical Measurements: Goal: Ability to maintain clinical measurements within normal limits will improve Outcome: Progressing Goal: Will remain free from infection Outcome: Progressing Goal: Diagnostic test results will improve Outcome: Progressing Goal: Cardiovascular complication will be avoided Outcome: Progressing   Problem: Activity: Goal: Risk for activity intolerance will decrease Outcome: Progressing   Problem: Nutrition: Goal: Adequate nutrition will be maintained Outcome: Progressing   Problem: Coping: Goal: Level of anxiety will decrease Outcome: Progressing   Problem: Elimination: Goal: Will not experience complications related to bowel motility Outcome: Progressing Goal: Will not experience complications related to urinary retention Outcome: Progressing   Problem: Pain Managment: Goal: General experience of comfort will improve Outcome: Progressing   Problem: Safety: Goal: Ability to remain free from injury will improve Outcome: Progressing   Problem: Skin Integrity: Goal: Risk for impaired skin integrity will decrease Outcome: Progressing   Problem: Education: Goal: Expressions of having a comfortable level of knowledge regarding the disease process will increase Outcome: Progressing   Problem: Coping: Goal: Ability to adjust to condition or change in health will improve Outcome: Progressing Goal: Ability to identify appropriate support needs will improve Outcome: Progressing   Problem: Health Behavior/Discharge Planning: Goal: Compliance with prescribed medication regimen will improve Outcome:  Progressing   Problem: Medication: Goal: Risk for medication side effects will decrease Outcome: Progressing   Problem: Clinical Measurements: Goal: Complications related to the disease process, condition or treatment will be avoided or minimized Outcome: Progressing Goal: Diagnostic test results will improve Outcome: Progressing   Problem: Safety: Goal: Verbalization of understanding the information provided will improve Outcome: Progressing   Problem: Self-Concept: Goal: Level of anxiety will decrease Outcome: Progressing Goal: Ability to verbalize feelings about condition will improve Outcome: Progressing

## 2021-07-18 NOTE — Progress Notes (Signed)
PROGRESS NOTE    Kirsten Mcmahon  SAY:[id number] DOB: 1946/03/11 DOA: 07/15/2021 PCP: [facility]   Chief Complain:Seizure  Brief Narrative: Patient is a 75 year old female with history of ESRD on dialysis on Monday Wednesday Friday, history of breast cancer, history of stroke, diabetes type 2, seizure disorder who was brought to the emergency department after she had seizure-like activity during hemodialysis.  As per the report, patient ran out of her [place] on 8/9.  Patient had 30 minutes left in her hemodialysis session, she started to have seizure-like activity.  Hemodialysis was stopped and she was brought to the emergency department.  She was postictal on presentation, lethargic, disoriented.  CT head did not show any acute intracranial normalities.  Neurology was consulted who recommended to continue Keppra 500 mg daily and 250 mg postdialysis.  Patient is still confused and more agitated.  As per discussion with the son, she cannot tolerate Keppra.  We reconsulted neurology.  Nephrology also following for dialysis.  Assessment & Plan:   Principal Problem:   Breakthrough seizure ([place]) Active Problems:   Essential hypertension   Hypothyroidism   Type 2 diabetes mellitus with ESRD (end-stage renal disease) (HCC)   End-stage renal disease on hemodialysis ([place])   Mixed diabetic hyperlipidemia associated with type 2 diabetes mellitus ([place])   Seizure ([place])   Altered mental status/agitation: Hospital course remarkable for persistent confusion, agitation.  Alert and oriented at baseline.  Son thinks that she is intolerant to [place] and has been skipping this medicine at home.  CT head on presentation did not show any acute intracranial abnormalities.  Reconsulted neurology.  Keppra stopped and started on Depakote.Continue delirium precaution. Still confused and agitated today  Seizure: Had seizure activity while at dialysis.  She recently ran out of [place] on 8/9.  EEG  showed cortical dysfunction in the right frontal region, moderate diffuse encephalopathy, no seizures or epileptiform discharges.  Currently on depakote  Hypoglycemia: Unknown etiology. Not on any meds at home. Hemoglobin A1c of 5.6 as per 6/22.  Was started on gentle D10.  Currently blood sugars are more stable  ESRD on dialysis: Has tunneled dialysis catheter on the right chest.  Nephrology consulted and following for dialysis.  History of hypertension: Continue current medication.  Monitor blood pressure  Hypothyroidism: Continue levothyroxine.TSH on 8/10 is normal        DVT prophylaxis:Heparin [place] Code Status: Full Family Communication: Discussed with son on phone today    Dispo: The patient is from: Home              Anticipated d/c is to: Home              Patient currently is not medically stable to d/c.   Difficult to place patient No     Consultants: neurology  Procedures:None  Antimicrobials:  Anti-infectives (From admission, onward)    None       Subjective:  Patient seen and examined the bedside this morning.  Hemodynamically stable.  Lying on the bed, eyes closed.  As per report, she was combative and agitated this morning.  She did not open her eyes or respond at to my voice. Looks comfortable.  Objective: Vitals:   07/18/21 0544 07/18/21 0600 07/18/21 0755 07/18/21 0757  BP: (!) 133/92 121/87 140/84 140/84  Pulse:    95  Resp: (!) 25 20  16   Temp:  98.4 F (36.9 C)  98.2 F (36.8 C)  TempSrc:  Oral  Oral  SpO2:  96%  99%  Weight:  55 kg      Intake/Output Summary (Last 24 hours) at 07/18/2021 0810 Last data filed at 07/18/2021 0700 Gross per 24 hour  Intake 600 ml  Output 600 ml  Net 0 ml   Filed Weights   07/18/21 0144 07/18/21 0600  Weight: 55.3 kg 55 kg    Examination:  General exam: Eyes closed, did not respond to verbal stimuli Respiratory system:  no wheezes or crackles  Cardiovascular system: S1 & S2 heard, RRR.  Temporary  dialysis catheter on the right chest Gastrointestinal system: Abdomen is nondistended, soft and nontender. Central nervous system: Not alert and oriented Extremities: No edema, no clubbing ,no cyanosis Skin: No rashes, no ulcers,no icterus      Data Reviewed: I have personally reviewed following labs and imaging studies  CBC: Recent Labs  Lab 07/15/21 1747 07/16/21 0530  WBC 8.6 6.6  NEUTROABS 7.0 3.8  HGB 9.8* 9.4*  HCT 32.1* 31.4*  MCV 95.8 97.2  PLT 156 073   Basic Metabolic Panel: Recent Labs  Lab 07/15/21 1747 07/16/21 0530 07/18/21 0435  NA 137 138  --   K 3.5 3.8  --   CL 102 103  --   CO2 26 27  --   GLUCOSE 109* 85  --   BUN 10 13  --   CREATININE 2.61* 3.52*  --   CALCIUM 8.4* 8.5*  --   MG  --  2.0  --   PHOS  --   --  3.8   GFR: Estimated Creatinine Clearance: 10.6 mL/min (A) (by C-G formula based on SCr of 3.52 mg/dL (H)). Liver Function Tests: Recent Labs  Lab 07/16/21 0530  AST 23  ALT 22  ALKPHOS 60  BILITOT 0.5  PROT 6.1*  ALBUMIN 2.6*   No results for input(s): LIPASE, AMYLASE in the last 168 hours. No results for input(s): AMMONIA in the last 168 hours. Coagulation Profile: No results for input(s): INR, PROTIME in the last 168 hours. Cardiac Enzymes: No results for input(s): CKTOTAL, CKMB, CKMBINDEX, TROPONINI in the last 168 hours. BNP (last 3 results) No results for input(s): PROBNP in the last 8760 hours. HbA1C: No results for input(s): HGBA1C in the last 72 hours. CBG: Recent Labs  Lab 07/17/21 1849 07/17/21 2031 07/17/21 2214 07/17/21 2340 07/18/21 0752  GLUCAP 95 76 113* 114* 102*   Lipid Profile: No results for input(s): CHOL, HDL, LDLCALC, TRIG, CHOLHDL, LDLDIRECT in the last 72 hours. Thyroid Function Tests: Recent Labs    07/15/21 1747  TSH 0.429  FREET4 1.37*   Anemia Panel: No results for input(s): VITAMINB12, FOLATE, FERRITIN, TIBC, IRON, RETICCTPCT in the last 72 hours. Sepsis Labs: No results for  input(s): PROCALCITON, LATICACIDVEN in the last 168 hours.  Recent Results (from the past 240 hour(s))  Resp Panel by RT-PCR (Flu A&B, Covid) Nasopharyngeal Swab     Status: None   Collection Time: 07/15/21  5:50 PM   Specimen: Nasopharyngeal Swab; Nasopharyngeal(NP) swabs in vial transport medium  Result Value Ref Range Status   SARS Coronavirus 2 by RT PCR NEGATIVE NEGATIVE Final    Comment: (NOTE) SARS-CoV-2 target nucleic acids are NOT DETECTED.  The SARS-CoV-2 RNA is generally detectable in upper respiratory specimens during the acute phase of infection. The lowest concentration of SARS-CoV-2 viral copies this assay can detect is 138 copies/mL. A negative result does not preclude SARS-Cov-2 infection and should not be used as the sole basis for  treatment or other patient management decisions. A negative result may occur with  improper specimen collection/handling, submission of specimen other than nasopharyngeal swab, presence of viral mutation(s) within the areas targeted by this assay, and inadequate number of viral copies(<138 copies/mL). A negative result must be combined with clinical observations, patient history, and epidemiological information. The expected result is Negative.  Fact Sheet for Patients:  EntrepreneurPulse.com.au  Fact Sheet for Healthcare Providers:  IncredibleEmployment.be  This test is no t yet approved or cleared by the [place] FDA and  has been authorized for detection and/or diagnosis of SARS-CoV-2 by FDA under an Emergency Use Authorization (EUA). This EUA will remain  in effect (meaning this test can be used) for the duration of the COVID-19 declaration under Section 564(b)(1) of the Act, 21 U.S.C.section 360bbb-3(b)(1), unless the authorization is terminated  or revoked sooner.       Influenza A by PCR NEGATIVE NEGATIVE Final   Influenza B by PCR NEGATIVE NEGATIVE Final    Comment: (NOTE) The  Xpert Xpress SARS-CoV-2/FLU/RSV plus assay is intended as an aid in the diagnosis of influenza from Nasopharyngeal swab specimens and should not be used as a sole basis for treatment. Nasal washings and aspirates are unacceptable for Xpert Xpress SARS-CoV-2/FLU/RSV testing.  Fact Sheet for Patients: EntrepreneurPulse.com.au  Fact Sheet for Healthcare Providers: IncredibleEmployment.be  This test is not yet approved or cleared by the [place] FDA and has been authorized for detection and/or diagnosis of SARS-CoV-2 by FDA under an Emergency Use Authorization (EUA). This EUA will remain in effect (meaning this test can be used) for the duration of the COVID-19 declaration under Section 564(b)(1) of the Act, 21 U.S.C. section 360bbb-3(b)(1), unless the authorization is terminated or revoked.  Performed at [facility], [place] [address] [place], [place] [address]          Radiology Studies: EEG adult  Result Date: 08-05-21 Lora Havens, MD     08-05-21  2:26 PM Patient Name: Kirsten Mcmahon MRN: [id number] Epilepsy Attending: Lora Havens Referring Physician/Provider: Dr Shelly Coss Date: 08-05-2021 Duration: 23.53 mins Patient history: 75 year old female with history of epilepsy who presented with breakthrough seizure as well as altered mental status.  EEG to evaluate for seizures. Level of alertness: Awake, asleep AEDs during EEG study: LEV Technical aspects: This EEG study was done with scalp electrodes positioned according to the 10-20 International system of electrode placement. Electrical activity was acquired at a sampling rate of 500Hz  and reviewed with a high frequency filter of 70Hz  and a low frequency filter of 1Hz . EEG data were recorded continuously and digitally stored. Description: No posterior dominant rhythm was seen. Sleep was characterized by vertex waves, sleep spindles (12 to 14 Hz), maximal frontocentral  region.  EEG showed continuous generalized polymorphic 3 to 6 Hz theta-delta slowing. There is also sharply contoured 3 to 5 Hz theta-delta slowing in right frontal region consistent with underlying breech artifact.  Hyperventilation and photic stimulation were not performed.   ABNORMALITY - Continuous slow, generalized - Breach artifact, right frontal region  IMPRESSION: This study is suggestive of cortical dysfunction in right frontal region consistent with prior craniotomy.  Additionally there is evidence of moderate diffuse encephalopathy, nonspecific etiology.  No seizures or definite epileptiform discharges were seen throughout the recording. [name]        Scheduled Meds:  (feeding supplement) PROSource Plus  30 mL Oral TID BM   aspirin EC  81 mg Oral Daily  calcitRIOL  0.5 mcg Oral Q M,W,F-HD   Chlorhexidine Gluconate Cloth  6 each Topical Q0600   divalproex  500 mg Oral Q12H   heparin  5,000 Units Subcutaneous Q8H   heparin sodium (porcine)       insulin aspart  0-6 Units Subcutaneous TID AC & HS   latanoprost  1 drop Both Eyes QHS   letrozole  2.5 mg Oral Daily   levothyroxine  112 mcg Oral Q0600   metoprolol succinate  50 mg Oral Daily   Continuous Infusions:  dextrose 50 mL/hr at 07/17/21 2025     LOS: 1 day    Time spent: 35 mins.More than 50% of that time was spent in counseling and/or coordination of care.      Shelly Coss, MD [facility] P8/13/2022, 8:10 AM

## 2021-07-18 NOTE — Progress Notes (Signed)
Pt aggressive, combative, attempting to hit RN and NT during patient care. Pt refusing all medications including Depakote and metroprolol, aspirin - MD Adhikari notified and new order for Depakote IV approved. Will continue to monitor.

## 2021-07-19 LAB — GLUCOSE, CAPILLARY
Glucose-Capillary: 160 mg/dL — ABNORMAL HIGH (ref 70–99)
Glucose-Capillary: 78 mg/dL (ref 70–99)
Glucose-Capillary: 76 mg/dL (ref 70–99)
Glucose-Capillary: 81 mg/dL (ref 70–99)

## 2021-07-19 LAB — HEPATITIS B SURFACE ANTIBODY, QUANTITATIVE: Hep B S AB Quant (Post): <3.1 m[IU]/mL — ABNORMAL LOW (ref >9.9)

## 2021-07-19 MED ORDER — SODIUM CHLORIDE 0.9 % IV SOLN
6.2500 mg | Freq: Three times a day (TID) | INTRAVENOUS | Status: DC | PRN
  Filled 2021-07-19: qty 0.25

## 2021-07-19 MED ORDER — DEXTROSE 5 % IV SOLN: INTRAVENOUS | Status: DC

## 2021-07-19 NOTE — Progress Notes (Signed)
PROGRESS NOTE    Kirsten Mcmahon  ZOX:[id number] DOB: 04-07-1946 DOA: 07/15/2021 PCP: [facility]   Chief Complain:Seizure  Brief Narrative: Patient is a 75 year old female with history of ESRD on dialysis on Monday Wednesday Friday, history of breast cancer, history of stroke, diabetes type 2, seizure disorder who was brought to the emergency department after she had seizure-like activity during hemodialysis.  As per the report, patient ran out of her [place] on 8/9.  Patient had 30 minutes left in her hemodialysis session, she started to have seizure-like activity.  Hemodialysis was stopped and she was brought to the emergency department.  She was postictal on presentation, lethargic, disoriented.  CT head did not show any acute intracranial normalities.  Neurology was consulted who recommended to continue Keppra 500 mg daily and 250 mg postdialysis.  Patient is still confused and more agitated.  As per discussion with the son, she cannot tolerate Keppra.  We reconsulted neurology.  Nephrology also following for dialysis.  Assessment & Plan:   Principal Problem:   Breakthrough seizure ([place]) Active Problems:   Essential hypertension   Hypothyroidism   Type 2 diabetes mellitus with ESRD (end-stage renal disease) (HCC)   End-stage renal disease on hemodialysis ([place])   Mixed diabetic hyperlipidemia associated with type 2 diabetes mellitus ([place])   Seizure ([place])   Altered mental status/agitation: Hospital course remarkable for persistent confusion, agitation.  Alert and oriented at baseline.  Son thinks that she is intolerant to [place] and has been skipping this medicine at home.  CT head on presentation did not show any acute intracranial abnormalities.  Reconsulted neurology.  Keppra stopped and started on valproate.Continue delirium precaution. Still confused and sleepy today.  She did not wake up on calling her name. On reviewing her medications, found that she was given 2  mg Ativan at 11 PM last night.  We need to avoid sedatives, narcotics as much as possible.  Seizure: Had seizure activity while at dialysis.  She recently ran out of [place] on 8/9.  EEG showed cortical dysfunction in the right frontal region, moderate diffuse encephalopathy, no seizures or epileptiform discharges.  Currently on valproate  Hypoglycemia: Unknown etiology. Not on any meds at home. Hemoglobin A1c of 5.6 as per 6/22.  Was started on gentle D10,now changed to D5 which we will continue because of poor oral intake.  Currently blood sugars are more stable.  ESRD on dialysis: Has tunneled dialysis catheter on the right chest.  Nephrology consulted and following for dialysis.  History of hypertension: Continue current medication.  Monitor blood pressure  Hypothyroidism: Continue levothyroxine.TSH on 8/10 is normal        DVT prophylaxis:Heparin [place] Code Status: Full Family Communication: Discussed with son on phone on 07/18/21    Dispo: The patient is from: Home              Anticipated d/c is to: Home              Patient currently is not medically stable to d/c.   Difficult to place patient No     Consultants: neurology  Procedures:None  Antimicrobials:  Anti-infectives (From admission, onward)    None       Subjective: Patient seen and examined the bedside this morning.  Hemodynamically stable.  Sleepy, could not wake up on calling her name or tapping her shoulder.  Objective: Vitals:   07/18/21 1956 07/18/21 2341 07/19/21 0306 07/19/21 0728  BP: (!) 149/96 132/83 118/74 (!) 148/79  Pulse: 97 100 88 86  Resp: 17 16 16 18   Temp: 97.7 F (36.5 C) 97.6 F (36.4 C) 97.6 F (36.4 C) 98.6 F (37 C)  TempSrc: Oral Axillary Axillary Oral  SpO2: 95% 100%  100%  Weight:       No intake or output data in the 24 hours ending 07/19/21 0733  Filed Weights   07/18/21 0144 07/18/21 0600  Weight: 55.3 kg 55 kg    Examination:  General exam: Eyes closed, did  not respond to verbal stimuli HEENT: PERRL Respiratory system:  no wheezes or crackles  Cardiovascular system: S1 & S2 heard, RRR.  Tunneled dialysis catheter on the right chest Gastrointestinal system: Abdomen is nondistended, soft and nontender. Central nervous system: Not Alert and oriented Extremities: No edema, no clubbing ,no cyanosis Skin: No rashes, no ulcers,no icterus      Data Reviewed: I have personally reviewed following labs and imaging studies  CBC: Recent Labs  Lab 07/15/21 1747 07/16/21 0530  WBC 8.6 6.6  NEUTROABS 7.0 3.8  HGB 9.8* 9.4*  HCT 32.1* 31.4*  MCV 95.8 97.2  PLT 156 314   Basic Metabolic Panel: Recent Labs  Lab 07/15/21 1747 07/16/21 0530 07/18/21 0435  NA 137 138  --   K 3.5 3.8  --   CL 102 103  --   CO2 26 27  --   GLUCOSE 109* 85  --   BUN 10 13  --   CREATININE 2.61* 3.52*  --   CALCIUM 8.4* 8.5*  --   MG  --  2.0  --   PHOS  --   --  3.8   GFR: Estimated Creatinine Clearance: 10.6 mL/min (A) (by C-G formula based on SCr of 3.52 mg/dL (H)). Liver Function Tests: Recent Labs  Lab 07/16/21 0530  AST 23  ALT 22  ALKPHOS 60  BILITOT 0.5  PROT 6.1*  ALBUMIN 2.6*   No results for input(s): LIPASE, AMYLASE in the last 168 hours. No results for input(s): AMMONIA in the last 168 hours. Coagulation Profile: No results for input(s): INR, PROTIME in the last 168 hours. Cardiac Enzymes: No results for input(s): CKTOTAL, CKMB, CKMBINDEX, TROPONINI in the last 168 hours. BNP (last 3 results) No results for input(s): PROBNP in the last 8760 hours. HbA1C: No results for input(s): HGBA1C in the last 72 hours. CBG: Recent Labs  Lab 07/18/21 0752 07/18/21 1159 07/18/21 1528 07/18/21 2214 07/19/21 0639  GLUCAP 102* 97 71 85 160*   Lipid Profile: No results for input(s): CHOL, HDL, LDLCALC, TRIG, CHOLHDL, LDLDIRECT in the last 72 hours. Thyroid Function Tests: No results for input(s): TSH, T4TOTAL, FREET4, T3FREE, THYROIDAB in  the last 72 hours.  Anemia Panel: No results for input(s): VITAMINB12, FOLATE, FERRITIN, TIBC, IRON, RETICCTPCT in the last 72 hours. Sepsis Labs: No results for input(s): PROCALCITON, LATICACIDVEN in the last 168 hours.  Recent Results (from the past 240 hour(s))  Resp Panel by RT-PCR (Flu A&B, Covid) Nasopharyngeal Swab     Status: None   Collection Time: 07/15/21  5:50 PM   Specimen: Nasopharyngeal Swab; Nasopharyngeal(NP) swabs in vial transport medium  Result Value Ref Range Status   SARS Coronavirus 2 by RT PCR NEGATIVE NEGATIVE Final    Comment: (NOTE) SARS-CoV-2 target nucleic acids are NOT DETECTED.  The SARS-CoV-2 RNA is generally detectable in upper respiratory specimens during the acute phase of infection. The lowest concentration of SARS-CoV-2 viral copies this assay can detect is 138 copies/mL. A  negative result does not preclude SARS-Cov-2 infection and should not be used as the sole basis for treatment or other patient management decisions. A negative result may occur with  improper specimen collection/handling, submission of specimen other than nasopharyngeal swab, presence of viral mutation(s) within the areas targeted by this assay, and inadequate number of viral copies(<138 copies/mL). A negative result must be combined with clinical observations, patient history, and epidemiological information. The expected result is Negative.  Fact Sheet for Patients:  EntrepreneurPulse.com.au  Fact Sheet for Healthcare Providers:  IncredibleEmployment.be  This test is no t yet approved or cleared by the [place] FDA and  has been authorized for detection and/or diagnosis of SARS-CoV-2 by FDA under an Emergency Use Authorization (EUA). This EUA will remain  in effect (meaning this test can be used) for the duration of the COVID-19 declaration under Section 564(b)(1) of the Act, 21 U.S.C.section 360bbb-3(b)(1), unless the  authorization is terminated  or revoked sooner.       Influenza A by PCR NEGATIVE NEGATIVE Final   Influenza B by PCR NEGATIVE NEGATIVE Final    Comment: (NOTE) The Xpert Xpress SARS-CoV-2/FLU/RSV plus assay is intended as an aid in the diagnosis of influenza from Nasopharyngeal swab specimens and should not be used as a sole basis for treatment. Nasal washings and aspirates are unacceptable for Xpert Xpress SARS-CoV-2/FLU/RSV testing.  Fact Sheet for Patients: EntrepreneurPulse.com.au  Fact Sheet for Healthcare Providers: IncredibleEmployment.be  This test is not yet approved or cleared by the [place] FDA and has been authorized for detection and/or diagnosis of SARS-CoV-2 by FDA under an Emergency Use Authorization (EUA). This EUA will remain in effect (meaning this test can be used) for the duration of the COVID-19 declaration under Section 564(b)(1) of the Act, 21 U.S.C. section 360bbb-3(b)(1), unless the authorization is terminated or revoked.  Performed at [facility], [place] [address], [place], [place] [address]          Radiology Studies: EEG adult  Result Date: 07-27-21 Lora Havens, MD     07/27/21  2:26 PM Patient Name: Kirsten Mcmahon MRN: [id number] Epilepsy Attending: Lora Havens Referring Physician/Provider: Dr Shelly Coss Date: July 27, 2021 Duration: 23.53 mins Patient history: 75 year old female with history of epilepsy who presented with breakthrough seizure as well as altered mental status.  EEG to evaluate for seizures. Level of alertness: Awake, asleep AEDs during EEG study: LEV Technical aspects: This EEG study was done with scalp electrodes positioned according to the 10-20 International system of electrode placement. Electrical activity was acquired at a sampling rate of 500Hz  and reviewed with a high frequency filter of 70Hz  and a low frequency filter of 1Hz . EEG data were recorded  continuously and digitally stored. Description: No posterior dominant rhythm was seen. Sleep was characterized by vertex waves, sleep spindles (12 to 14 Hz), maximal frontocentral region.  EEG showed continuous generalized polymorphic 3 to 6 Hz theta-delta slowing. There is also sharply contoured 3 to 5 Hz theta-delta slowing in right frontal region consistent with underlying breech artifact.  Hyperventilation and photic stimulation were not performed.   ABNORMALITY - Continuous slow, generalized - Breach artifact, right frontal region  IMPRESSION: This study is suggestive of cortical dysfunction in right frontal region consistent with prior craniotomy.  Additionally there is evidence of moderate diffuse encephalopathy, nonspecific etiology.  No seizures or definite epileptiform discharges were seen throughout the recording. Priyanka O Yadav        Scheduled Meds:  (feeding supplement)  PROSource Plus  30 mL Oral TID BM   aspirin EC  81 mg Oral Daily   calcitRIOL  0.5 mcg Oral Q M,W,F-HD   Chlorhexidine Gluconate Cloth  6 each Topical Q0600   heparin  5,000 Units Subcutaneous Q8H   insulin aspart  0-6 Units Subcutaneous TID AC & HS   latanoprost  1 drop Both Eyes QHS   letrozole  2.5 mg Oral Daily   levothyroxine  112 mcg Oral Q0600   metoprolol succinate  50 mg Oral Daily   Continuous Infusions:  dextrose 50 mL/hr at 07/18/21 1820   valproate sodium 500 mg (07/18/21 2242)     LOS: 2 days    Time spent: 35 mins.More than 50% of that time was spent in counseling and/or coordination of care.      Shelly Coss, MD [facility] P8/14/2022, 7:33 AM

## 2021-07-19 NOTE — Plan of Care (Signed)
  Problem: Education: Goal: Knowledge of General Education information will improve Description: Including pain rating scale, medication(s)/side effects and non-pharmacologic comfort measures Outcome: Progressing   Problem: Health Behavior/Discharge Planning: Goal: Ability to manage health-related needs will improve Outcome: Progressing   Problem: Clinical Measurements: Goal: Ability to maintain clinical measurements within normal limits will improve Outcome: Progressing Goal: Will remain free from infection Outcome: Progressing Goal: Diagnostic test results will improve Outcome: Progressing Goal: Cardiovascular complication will be avoided Outcome: Progressing   Problem: Activity: Goal: Risk for activity intolerance will decrease Outcome: Progressing   Problem: Nutrition: Goal: Adequate nutrition will be maintained Outcome: Progressing   Problem: Coping: Goal: Level of anxiety will decrease Outcome: Progressing   Problem: Elimination: Goal: Will not experience complications related to bowel motility Outcome: Progressing Goal: Will not experience complications related to urinary retention Outcome: Progressing   Problem: Pain Managment: Goal: General experience of comfort will improve Outcome: Progressing   Problem: Safety: Goal: Ability to remain free from injury will improve Outcome: Progressing   Problem: Skin Integrity: Goal: Risk for impaired skin integrity will decrease Outcome: Progressing   Problem: Education: Goal: Expressions of having a comfortable level of knowledge regarding the disease process will increase Outcome: Progressing   Problem: Coping: Goal: Ability to adjust to condition or change in health will improve Outcome: Progressing Goal: Ability to identify appropriate support needs will improve Outcome: Progressing   Problem: Health Behavior/Discharge Planning: Goal: Compliance with prescribed medication regimen will improve Outcome:  Progressing   Problem: Medication: Goal: Risk for medication side effects will decrease Outcome: Progressing   Problem: Clinical Measurements: Goal: Complications related to the disease process, condition or treatment will be avoided or minimized Outcome: Progressing Goal: Diagnostic test results will improve Outcome: Progressing   Problem: Safety: Goal: Verbalization of understanding the information provided will improve Outcome: Progressing   Problem: Self-Concept: Goal: Level of anxiety will decrease Outcome: Progressing Goal: Ability to verbalize feelings about condition will improve Outcome: Progressing   Problem: Safety: Goal: Non-violent Restraint(s) Outcome: Progressing

## 2021-07-19 NOTE — Progress Notes (Addendum)
[facility] Progress Note   Subjective:    Seen and examined patient at bedside. Patient currently sleeping. Spoke to unit RN-informed of previous episodes combative/agitation/sundowning. Plan for HD 07/20/21.  Objective Vitals:   07/18/21 2341 07/19/21 0306 07/19/21 0728 07/19/21 1127  BP: 132/83 118/74 (!) 148/79 (!) 153/79  Pulse: 100 88 86 77  Resp: 16 16 18 18   Temp: 97.6 F (36.4 C) 97.6 F (36.4 C) 98.6 F (37 C) 98.2 F (36.8 C)  TempSrc: Axillary Axillary Oral   SpO2: 100%  100% 100%  Weight:       Physical Exam General: Ill-appearing; NAD Lungs: CTA bilaterally. No wheeze, rales or rhonchi. Breathing is unlabored. Heart: RRR. No murmur, rubs or gallops.  Abdomen: soft, nontender, +BS Lower extremities:no edema BLLE Neuro: Sleeping, moves all extremities spontaneously. Dialysis Access: [facility]  Filed Weights   07/18/21 0144 07/18/21 0600  Weight: 55.3 kg 55 kg    Intake/Output Summary (Last 24 hours) at 07/19/2021 1223 Last data filed at 07/18/2021 1800 Gross per 24 hour  Intake 60 ml  Output --  Net 60 ml    Additional Objective Labs: Basic Metabolic Panel: Recent Labs  Lab 07/15/21 1747 07/16/21 0530 07/18/21 0435  NA 137 138  --   K 3.5 3.8  --   CL 102 103  --   CO2 26 27  --   GLUCOSE 109* 85  --   BUN 10 13  --   CREATININE 2.61* 3.52*  --   CALCIUM 8.4* 8.5*  --   PHOS  --   --  3.8   Liver Function Tests: Recent Labs  Lab 07/16/21 0530  AST 23  ALT 22  ALKPHOS 60  BILITOT 0.5  PROT 6.1*  ALBUMIN 2.6*   No results for input(s): LIPASE, AMYLASE in the last 168 hours. CBC: Recent Labs  Lab 07/15/21 1747 07/16/21 0530  WBC 8.6 6.6  NEUTROABS 7.0 3.8  HGB 9.8* 9.4*  HCT 32.1* 31.4*  MCV 95.8 97.2  PLT 156 173   Blood Culture    Component Value Date/Time   SDES BLOOD LEFT FOREARM 05/17/2021 0953   SPECREQUEST  05/17/2021 0953    BOTTLES DRAWN AEROBIC AND ANAEROBIC Blood Culture adequate volume   CULT   05/17/2021 0953    NO GROWTH 5 DAYS Performed at [facility], [place] [address] [place], [place] [address]    REPTSTATUS 05/22/2021 FINAL 05/17/2021 0953    Cardiac Enzymes: No results for input(s): CKTOTAL, CKMB, CKMBINDEX, TROPONINI in the last 168 hours. CBG: Recent Labs  Lab 07/18/21 1159 07/18/21 1528 07/18/21 2214 07/19/21 0639 07/19/21 1127  GLUCAP 97 71 85 160* 78   Iron Studies: No results for input(s): IRON, TIBC, TRANSFERRIN, FERRITIN in the last 72 hours. Lab Results  Component Value Date   INR 1.0 04/30/2021   Studies/Results: EEG adult  Result Date: 07/17/2021 Lora Havens, MD     07/17/2021  2:26 PM Patient Name: Kirsten Mcmahon MRN: [id number] Epilepsy Attending: Lora Havens Referring Physician/Provider: Dr Shelly Coss Date: 07/17/2021 Duration: 23.53 mins Patient history: 75 year old female with history of epilepsy who presented with breakthrough seizure as well as altered mental status.  EEG to evaluate for seizures. Level of alertness: Awake, asleep AEDs during EEG study: LEV Technical aspects: This EEG study was done with scalp electrodes positioned according to the 10-20 International system of electrode placement. Electrical activity was acquired at a sampling rate of 500Hz  and reviewed with  a high frequency filter of 70Hz  and a low frequency filter of 1Hz . EEG data were recorded continuously and digitally stored. Description: No posterior dominant rhythm was seen. Sleep was characterized by vertex waves, sleep spindles (12 to 14 Hz), maximal frontocentral region.  EEG showed continuous generalized polymorphic 3 to 6 Hz theta-delta slowing. There is also sharply contoured 3 to 5 Hz theta-delta slowing in right frontal region consistent with underlying breech artifact.  Hyperventilation and photic stimulation were not performed.   ABNORMALITY - Continuous slow, generalized - Breach artifact, right frontal region  IMPRESSION: This study is suggestive of  cortical dysfunction in right frontal region consistent with prior craniotomy.  Additionally there is evidence of moderate diffuse encephalopathy, nonspecific etiology.  No seizures or definite epileptiform discharges were seen throughout the recording. [name]    Medications:  dextrose     valproate sodium 500 mg (07/19/21 1002)    (feeding supplement) PROSource Plus  30 mL Oral TID BM   aspirin EC  81 mg Oral Daily   calcitRIOL  0.5 mcg Oral Q M,W,F-HD   Chlorhexidine Gluconate Cloth  6 each Topical Q0600   heparin  5,000 Units Subcutaneous Q8H   insulin aspart  0-6 Units Subcutaneous TID AC & HS   latanoprost  1 drop Both Eyes QHS   letrozole  2.5 mg Oral Daily   levothyroxine  112 mcg Oral Q0600   metoprolol succinate  50 mg Oral Daily    Dialysis Orders: HD Orders: [name] MWF 3:45 hr 180 NRE 400/Autoflow 1.5 56 kg 3.0 K/2.5 Ca TDC  -No Heparin -Mircera 150 mcg IV Q 2 weeks (last dose 07/15/2021) -Venofer 50 mg IV weekly (last dose 07/15/2021) -Calcitriol 0.5 mcg PO TIW  Assessment/Plan: Seizure-experienced a seizure during her last outpatient HD on 07/15/21. No seizures noted during hospitalization so far. Neurology following and EEG showed showed cortical dysfunction in the right frontal region . Altered Mental Status-Currently sleeping. CT head (-) acute intracranial abnormalities. Neurology re-consulted. Continue Valproate. Noted previous episodes of combative/agitation-this can negatively impact whether she will be able to continue outpatient HD. We need watch her mental status very closely-goal to ensure safety in the outpatient HD center-recommend patient be placed in recliner for next HD 07/20/21 and for HD to be performed during the day d/t previous episodes of sundowning. ESRD - on HD MWF. Plan for HD 07/20/21 per usual schedule. UF as tolerated. Hypertension/volume  - Patient euvolemic on exam, Bps improving-continue HTN medication-monitor BP and HR. Anemia of  CKD - Hgb 9.4-recently received ESA and Fe in outpatient-monitor trends-may have to resume here. CBC ordered in AM Secondary Hyperparathyroidism -  Ca 8.5-continue PO calcitriol. RFP ordered in AM Nutrition - Continue carb-modified renal diet. Albumin 2.6-continue protein supplements.  Tobie Poet, NP [facility] 07/19/2021,12:23 PM  LOS: 2 days

## 2021-07-19 NOTE — Progress Notes (Signed)
Per lab staff, blood draws have been unsuccessfully attempted four times this morning. They will not attempt any further blood draws for 24 hours, per their protocol.

## 2021-07-20 ENCOUNTER — Inpatient Hospital Stay (HOSPITAL_COMMUNITY): Payer: Medicare (Managed Care)

## 2021-07-20 LAB — CBC
HCT: 35.9 % — ABNORMAL LOW (ref 36.0–46.0)
Hemoglobin: 11.3 g/dL — ABNORMAL LOW (ref 12.0–15.0)
MCH: 29.4 pg (ref 26.0–34.0)
MCHC: 31.5 g/dL (ref 30.0–36.0)
MCV: 93.5 fL (ref 80.0–100.0)
Platelets: 241 10*3/uL (ref 150–400)
RBC: 3.84 MIL/uL — ABNORMAL LOW (ref 3.87–5.11)
RDW: 15.7 % — ABNORMAL HIGH (ref 11.5–15.5)
WBC: 11.7 10*3/uL — ABNORMAL HIGH (ref 4.0–10.5)
nRBC: 0 % (ref 0.0–0.2)

## 2021-07-20 LAB — RENAL FUNCTION PANEL
Albumin: 2.6 g/dL — ABNORMAL LOW (ref 3.5–5.0)
Anion gap: 9 (ref 5–15)
BUN: 18 mg/dL (ref 8–23)
CO2: 27 mmol/L (ref 22–32)
Calcium: 8.4 mg/dL — ABNORMAL LOW (ref 8.9–10.3)
Chloride: 97 mmol/L — ABNORMAL LOW (ref 98–111)
Creatinine, Ser: 4.87 mg/dL — ABNORMAL HIGH (ref 0.44–1.00)
GFR, Estimated: 9 mL/min — ABNORMAL LOW (ref >60)
Glucose, Bld: 94 mg/dL (ref 70–99)
Phosphorus: 3.9 mg/dL (ref 2.5–4.6)
Potassium: 3.9 mmol/L (ref 3.5–5.1)
Sodium: 133 mmol/L — ABNORMAL LOW (ref 135–145)

## 2021-07-20 LAB — GLUCOSE, CAPILLARY
Glucose-Capillary: 66 mg/dL — ABNORMAL LOW (ref 70–99)
Glucose-Capillary: 93 mg/dL (ref 70–99)
Glucose-Capillary: 89 mg/dL (ref 70–99)

## 2021-07-20 IMAGING — CT CT HEAD W/O CM
3 of 5 series · 16 of 47 positions shown, 19 images · non-contrast
Comparison: [DATE]

CLINICAL DATA: Seizure

EXAM:
CT HEAD WITHOUT CONTRAST
TECHNIQUE: Contiguous axial images were obtained from the base of the skull
through the vertex without intravenous contrast.

[Series 4: head 2.0 h70h · axial · 0.41mm/px · z∈[+1173,+1315]mm · 10 of 83 slices shown, 13 images]
[im 6/83  brain]
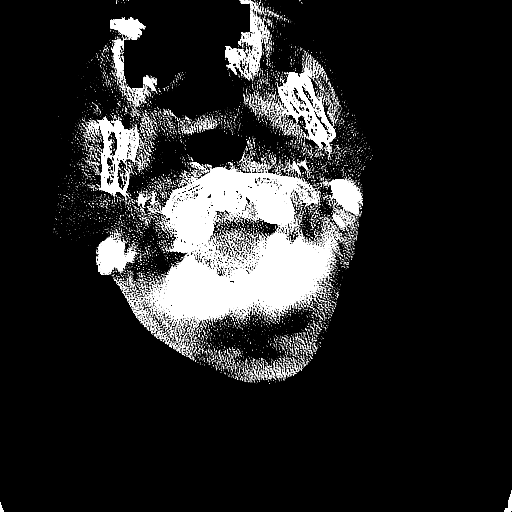
[im 6/83  bone]
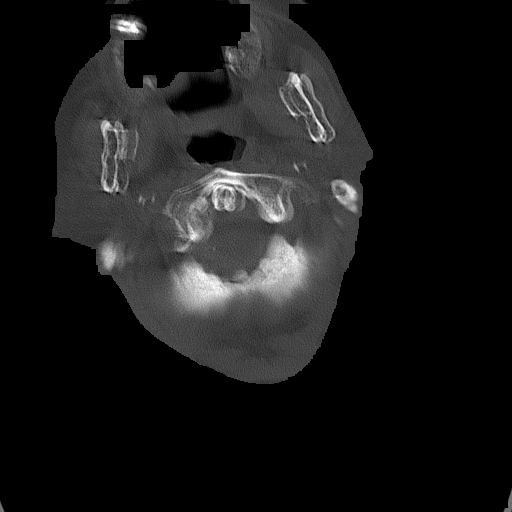
[im 16/83  brain]
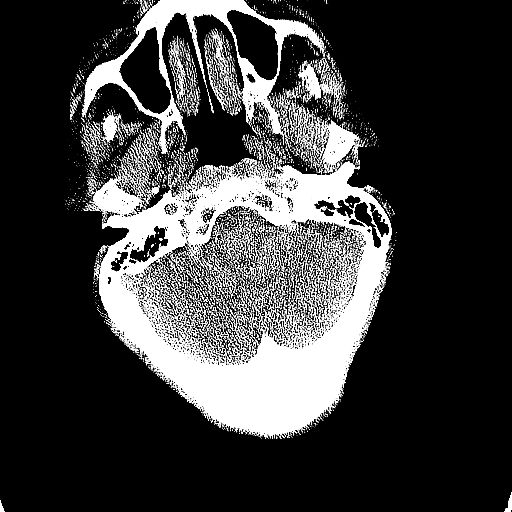
[im 21/83  brain]
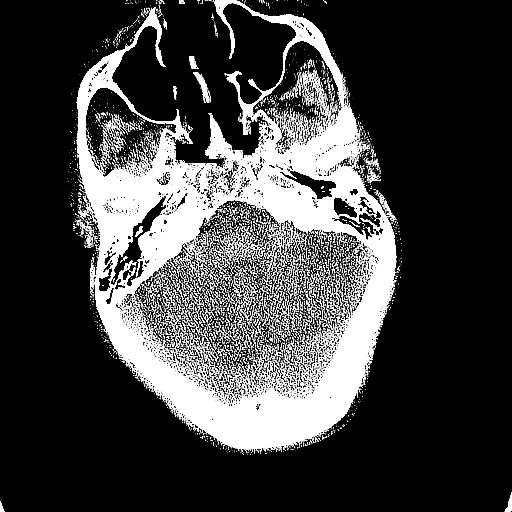
[im 31/83  brain]
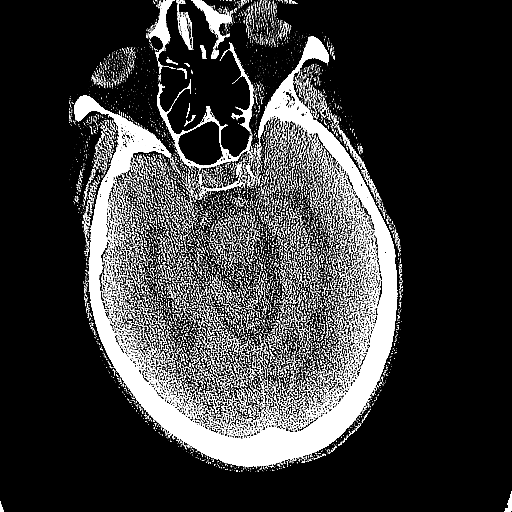
[im 36/83  brain]
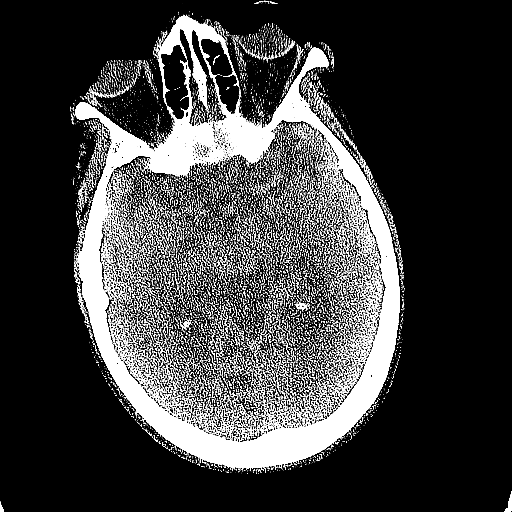
[im 36/83  bone]
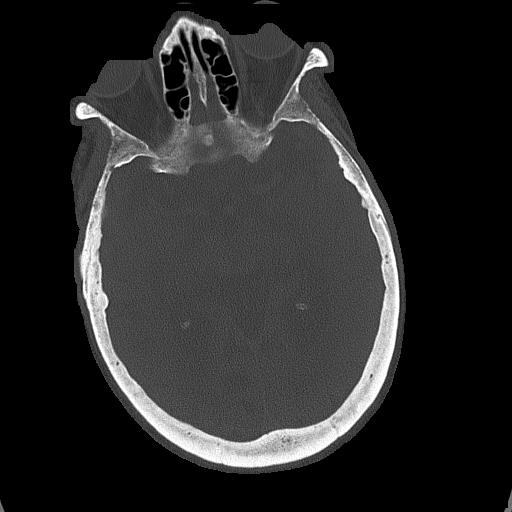
[im 47/83  brain]
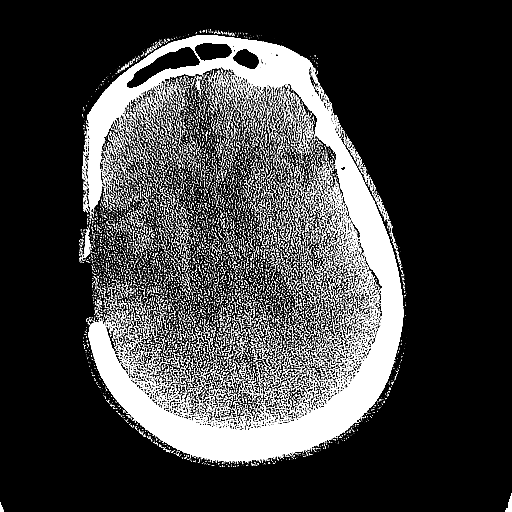
[im 52/83  brain]
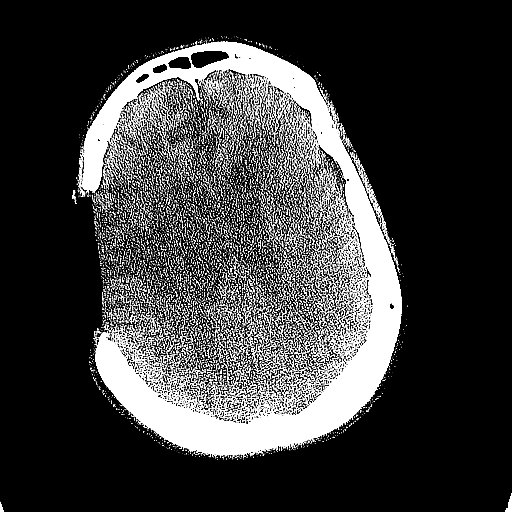
[im 62/83  brain]
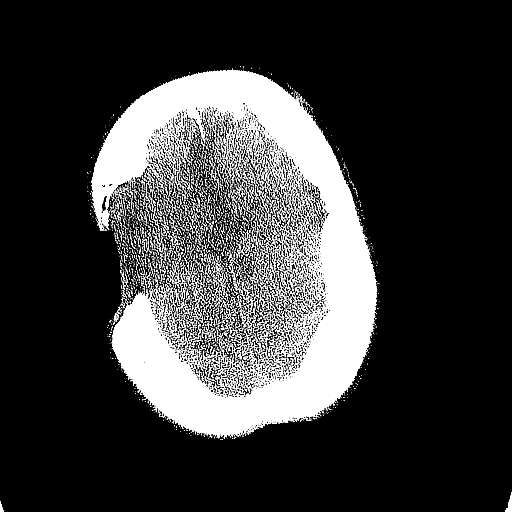
[im 67/83  brain]
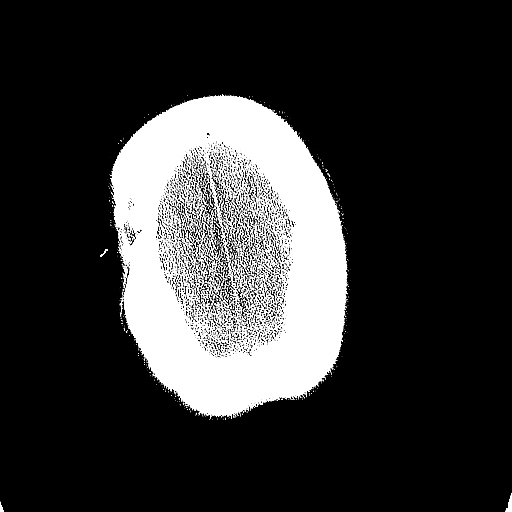
[im 67/83  bone]
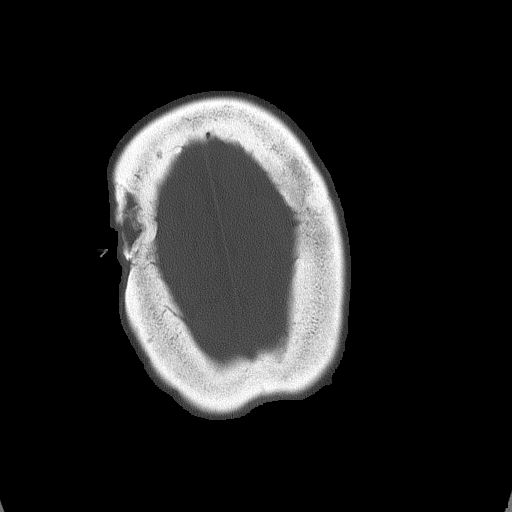
[im 77/83  brain]
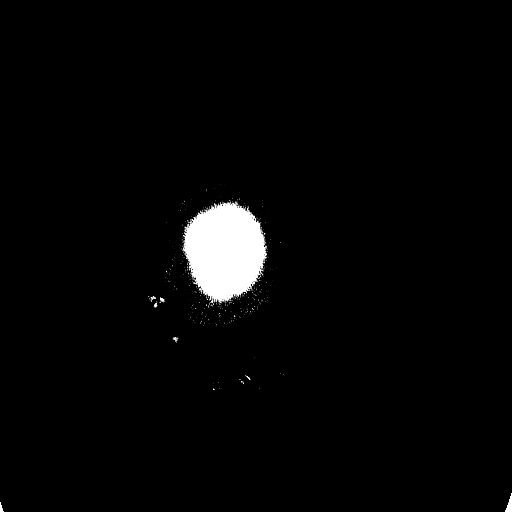

[Series 7: head 3.0 mpr cor · coronal · 0.32mm/px · 3 of 74 slices shown]
[im 25/74  brain]
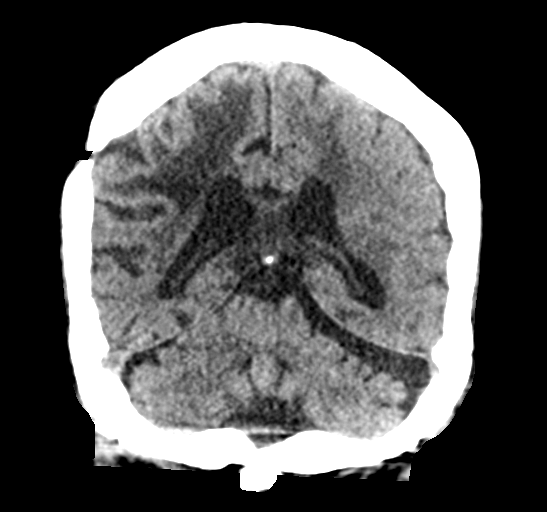
[im 33/74  brain]
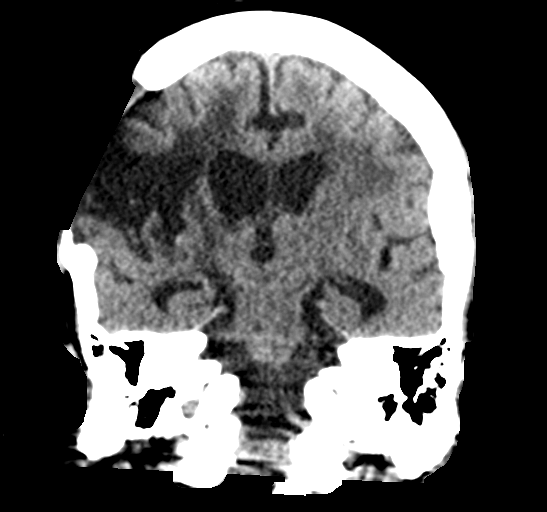
[im 41/74  brain]
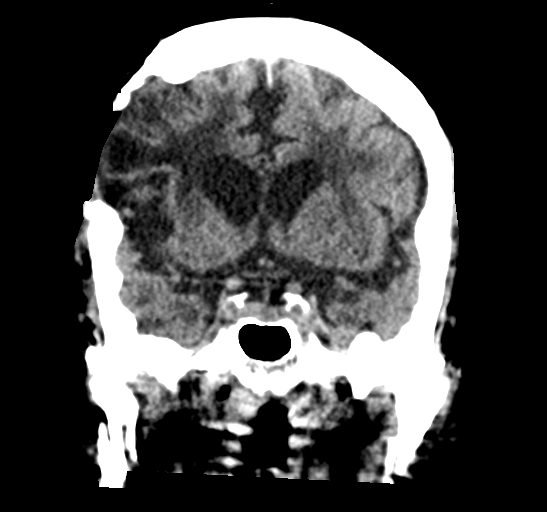

[Series 8: head 3.0 mpr sag · sagittal · 0.33mm/px · 3 of 49 slices shown]
[im 17/49  brain]
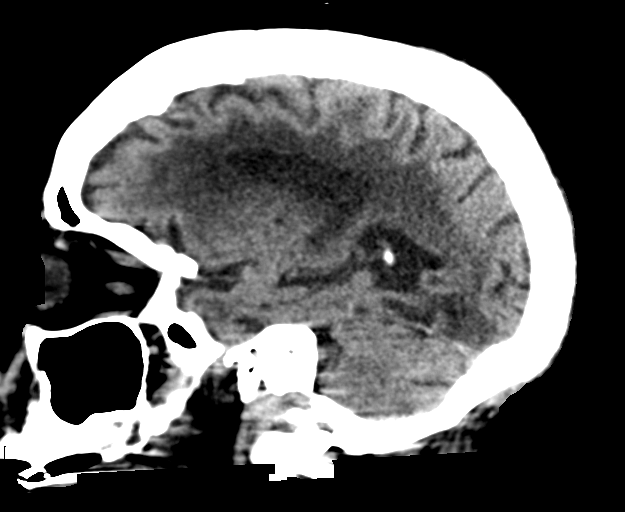
[im 25/49  brain]
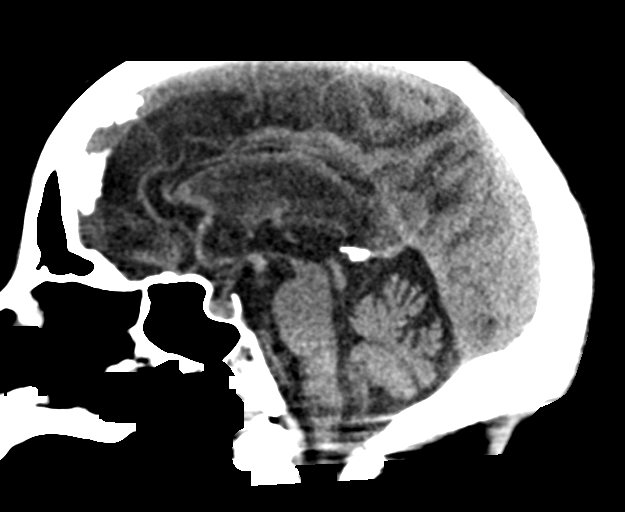
[im 33/49  brain]
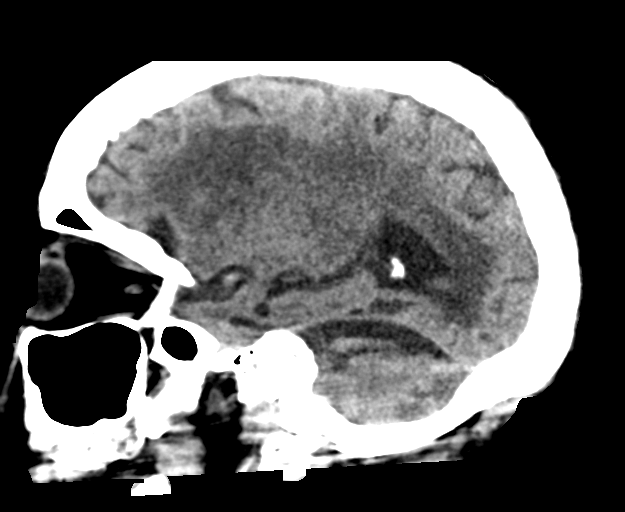

[16 of 47 positions shown; findings below may reference images not displayed]

FINDINGS: Brain: Old right MCA territory infarct on background of severe
chronic small vessel disease. There are multiple old cerebellar
infarcts. Diffuse generalized atrophy. No acute hemorrhage.

Vascular: No abnormal hyperdensity of the major intracranial
arteries or dural venous sinuses. No intracranial atherosclerosis.

Skull: The visualized skull base, calvarium and extracranial soft
tissues are normal.

Sinuses/Orbits: No fluid levels or advanced mucosal thickening of
the visualized paranasal sinuses. No mastoid or middle ear effusion.
The orbits are normal.
IMPRESSION: 1. No acute intracranial abnormality.
2. Old right MCA territory infarct and multiple old cerebellar
infarcts.
3. Severe chronic small vessel disease and generalized atrophy.

## 2021-07-20 IMAGING — DX DG CHEST 1V PORT
1 series · 1 of 1 positions shown · non-contrast
Comparison: [DATE]

CLINICAL DATA: Confusion

EXAM:
PORTABLE CHEST 1 VIEW

[chest]
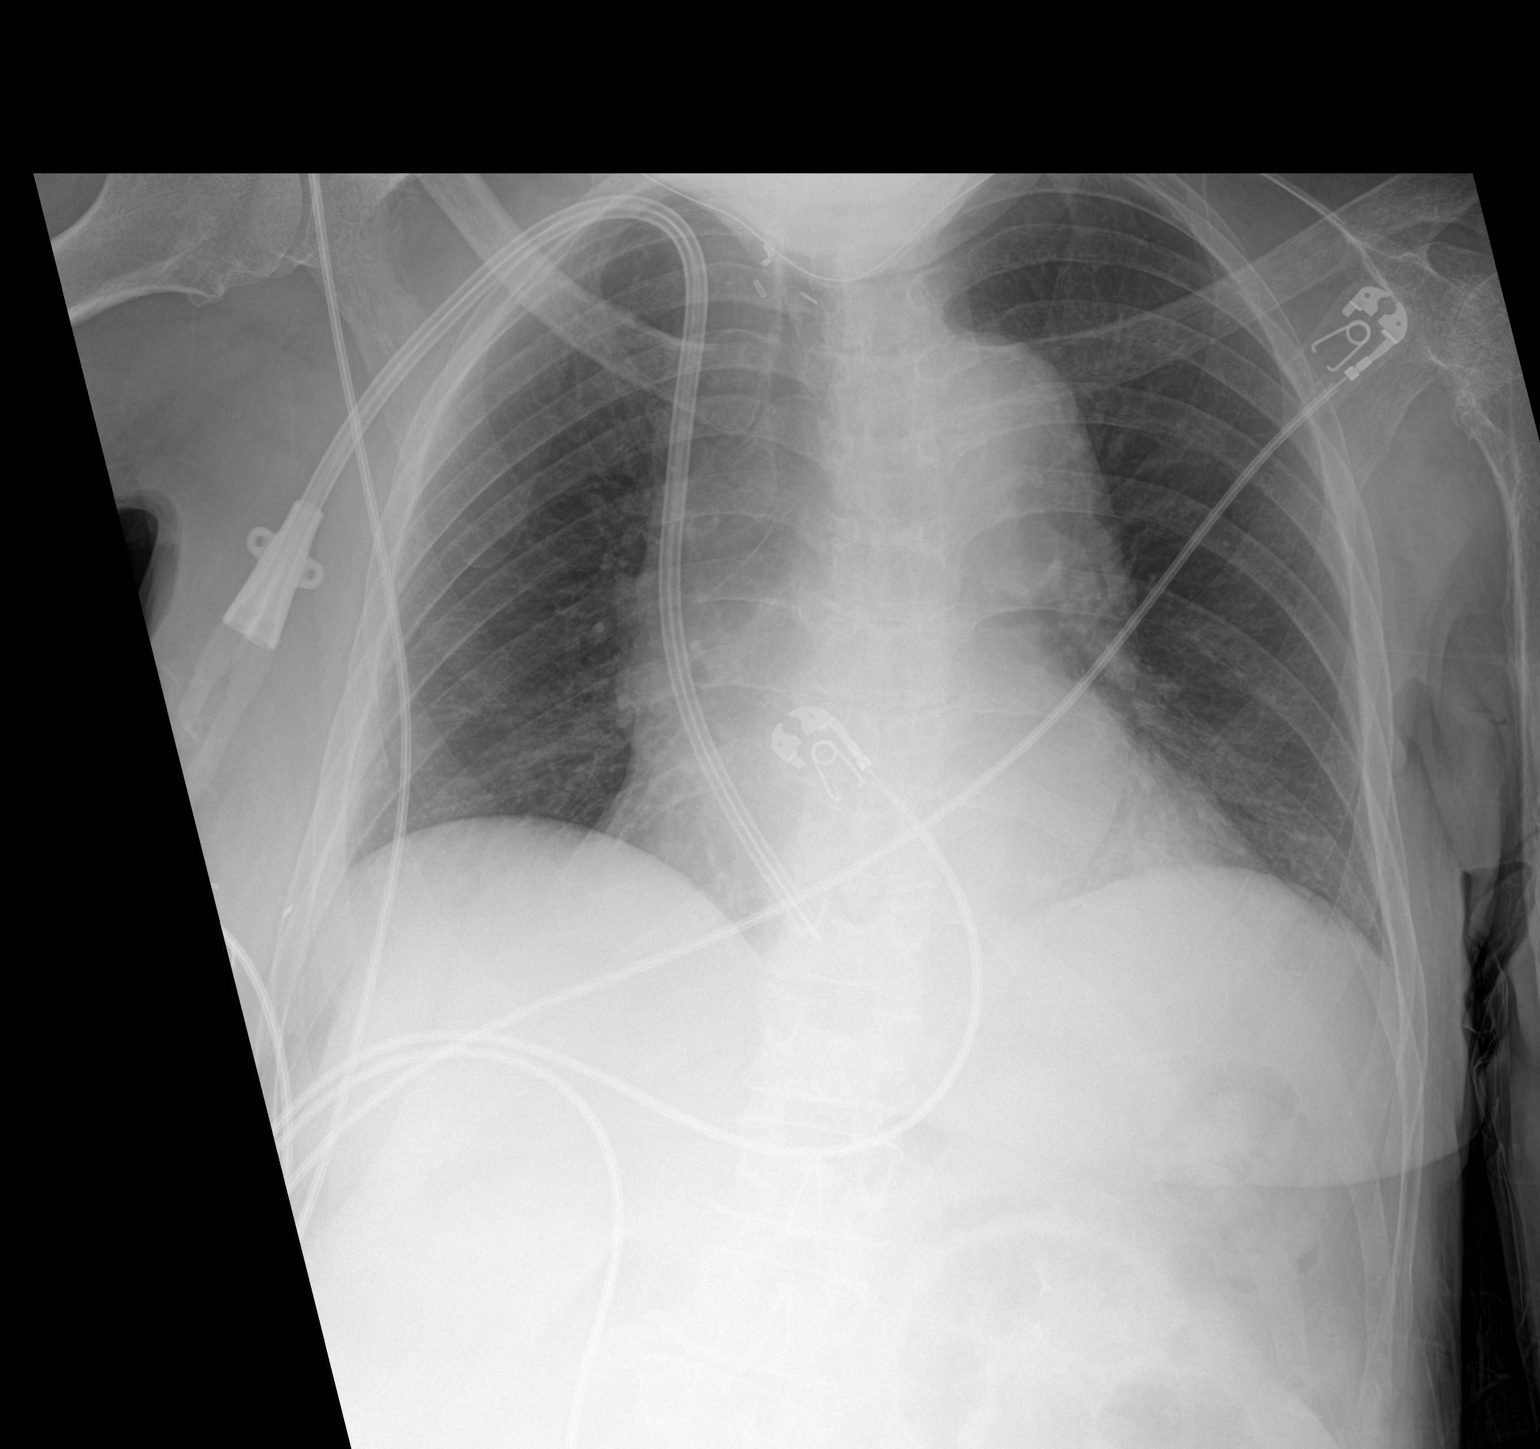

[1 of 1 positions shown; findings below may reference images not displayed]

FINDINGS: Right neck catheter tip overlies the right atrium. Unchanged,
enlarged cardiac silhouette. No focal airspace disease. No large
pleural effusion or visible pneumothorax. Bilateral shoulder
degenerative changes. No acute osseous abnormality.
IMPRESSION: No evidence of acute cardiopulmonary disease.

## 2021-07-20 MED ORDER — ALTEPLASE 2 MG IJ SOLR: 2.0000 mg | Freq: Once | INTRAMUSCULAR | Status: DC | PRN

## 2021-07-20 MED ORDER — HEPARIN SODIUM (PORCINE) 1000 UNIT/ML DIALYSIS: 1000.0000 [IU] | INTRAMUSCULAR | Status: DC | PRN

## 2021-07-20 MED ORDER — LIDOCAINE-PRILOCAINE 2.5-2.5 % EX CREA: 1.0000 "application " | TOPICAL_CREAM | CUTANEOUS | Status: DC | PRN

## 2021-07-20 MED ORDER — SODIUM CHLORIDE 0.9 % IV SOLN: 100.0000 mL | INTRAVENOUS | Status: DC | PRN

## 2021-07-20 MED ORDER — PENTAFLUOROPROP-TETRAFLUOROETH EX AERO: 1.0000 "application " | INHALATION_SPRAY | CUTANEOUS | Status: DC | PRN

## 2021-07-20 MED ORDER — THIAMINE HCL 100 MG/ML IJ SOLN
100.0000 mg | Freq: Every day | INTRAMUSCULAR | Status: DC
  Administered 2021-07-28: 100 mg via INTRAVENOUS
  Filled 2021-07-20 (×2): qty 2

## 2021-07-20 MED ORDER — THIAMINE HCL 100 MG/ML IJ SOLN
500.0000 mg | Freq: Three times a day (TID) | INTRAVENOUS | Status: AC
  Administered 2021-07-20 – 2021-07-22 (×6): 500 mg via INTRAVENOUS
  Filled 2021-07-20 (×6): qty 5

## 2021-07-20 MED ORDER — LORAZEPAM 2 MG/ML IJ SOLN
1.0000 mg | Freq: Once | INTRAMUSCULAR | Status: AC
  Administered 2021-07-22: 1 mg via INTRAVENOUS
  Filled 2021-07-20 (×2): qty 1

## 2021-07-20 MED ORDER — LIDOCAINE HCL (PF) 1 % IJ SOLN: 5.0000 mL | INTRAMUSCULAR | Status: DC | PRN

## 2021-07-20 MED ORDER — GLUCAGON HCL RDNA (DIAGNOSTIC) 1 MG IJ SOLR
INTRAMUSCULAR | Status: AC
  Filled 2021-07-20: qty 1

## 2021-07-20 MED ORDER — HEPARIN SODIUM (PORCINE) 1000 UNIT/ML IJ SOLN
INTRAMUSCULAR | Status: AC
  Filled 2021-07-20: qty 4

## 2021-07-20 MED ORDER — THIAMINE HCL 100 MG/ML IJ SOLN
250.0000 mg | Freq: Every day | INTRAVENOUS | Status: AC
  Administered 2021-07-22 – 2021-07-27 (×6): 250 mg via INTRAVENOUS
  Filled 2021-07-20 (×6): qty 2.5

## 2021-07-20 NOTE — Procedures (Signed)
Patient seen and examined on Hemodialysis. BP 117/90 (BP Location: Left Arm)   Pulse (!) 115   Temp (!) 97.5 F (36.4 C) (Oral)   Resp (!) 22   Wt 53.5 kg   SpO2 100%   BMI 21.57 kg/m   QB 400 mL/ min via TDC, UF goal 1L  Tolerating treatment without complaints at this time.  She appears very frail-- ? If she's appropriate for OP rx   Madelon Lips MD [place] [facility] [phone] 11:43 AM

## 2021-07-20 NOTE — Progress Notes (Signed)
[facility] Progress Note   Subjective:     Seen and examined.  Opens eyes to voice but falls back asleep immediately.    Objective Vitals:   07/20/21 0927 07/20/21 1000 07/20/21 1030 07/20/21 1100  BP: 128/84 127/90 (!) 131/93 117/90  Pulse: 97 (!) 103 (!) 119 (!) 115  Resp: 18 16 (!) 21 (!) 22  Temp:      TempSrc:      SpO2:      Weight:       Physical Exam General: frail-appearing Lungs: clear bilaterally Heart: RRR. No murmur, rubs or gallops.  Abdomen: soft, nontender, +BS Lower extremities:no edema  Neuro: opens eyes slightly and then falls back asleep Dialysis Access: [facility]  Filed Weights   07/18/21 0144 07/18/21 0600 07/20/21 0920  Weight: 55.3 kg 55 kg 53.5 kg    Intake/Output Summary (Last 24 hours) at 07/20/2021 1135 Last data filed at 07/19/2021 1246 Gross per 24 hour  Intake 120 ml  Output --  Net 120 ml    Additional Objective Labs: Basic Metabolic Panel: Recent Labs  Lab 07/15/21 1747 07/16/21 0530 07/18/21 0435 07/20/21 0934  NA 137 138  --  133*  K 3.5 3.8  --  3.9  CL 102 103  --  97*  CO2 26 27  --  27  GLUCOSE 109* 85  --  94  BUN 10 13  --  18  CREATININE 2.61* 3.52*  --  4.87*  CALCIUM 8.4* 8.5*  --  8.4*  PHOS  --   --  3.8 3.9   Liver Function Tests: Recent Labs  Lab 07/16/21 0530 07/20/21 0934  AST 23  --   ALT 22  --   ALKPHOS 60  --   BILITOT 0.5  --   PROT 6.1*  --   ALBUMIN 2.6* 2.6*   No results for input(s): LIPASE, AMYLASE in the last 168 hours. CBC: Recent Labs  Lab 07/15/21 1747 07/16/21 0530 07/20/21 0934  WBC 8.6 6.6 11.7*  NEUTROABS 7.0 3.8  --   HGB 9.8* 9.4* 11.3*  HCT 32.1* 31.4* 35.9*  MCV 95.8 97.2 93.5  PLT 156 173 241   Blood Culture    Component Value Date/Time   SDES BLOOD LEFT FOREARM 05/17/2021 0953   SPECREQUEST  05/17/2021 0953    BOTTLES DRAWN AEROBIC AND ANAEROBIC Blood Culture adequate volume   CULT  05/17/2021 0953    NO GROWTH 5 DAYS Performed at [facility], [place] [address] [place], [place] [address]    REPTSTATUS 05/22/2021 FINAL 05/17/2021 0953    Cardiac Enzymes: No results for input(s): CKTOTAL, CKMB, CKMBINDEX, TROPONINI in the last 168 hours. CBG: Recent Labs  Lab 07/19/21 1127 07/19/21 1544 07/19/21 2208 07/20/21 0643 07/20/21 0719  GLUCAP 78 76 81 66* 93   Iron Studies: No results for input(s): IRON, TIBC, TRANSFERRIN, FERRITIN in the last 72 hours. Lab Results  Component Value Date   INR 1.0 04/30/2021   Studies/Results: No results found.  Medications:  sodium chloride     sodium chloride     dextrose 30 mL/hr at 07/20/21 0011   promethazine (PHENERGAN) injection (IM or IVPB)     valproate sodium 500 mg (07/19/21 2230)    (feeding supplement) PROSource Plus  30 mL Oral TID BM   aspirin EC  81 mg Oral Daily   calcitRIOL  0.5 mcg Oral Q M,W,F-HD   Chlorhexidine Gluconate Cloth  6 each Topical Q0600  glucagon (human recombinant)       heparin  5,000 Units Subcutaneous Q8H   insulin aspart  0-6 Units Subcutaneous TID AC & HS   latanoprost  1 drop Both Eyes QHS   letrozole  2.5 mg Oral Daily   levothyroxine  112 mcg Oral Q0600   metoprolol succinate  50 mg Oral Daily    Dialysis Orders: HD Orders: [name] MWF 3:45 hr 180 NRE 400/Autoflow 1.5 56 kg 3.0 K/2.5 Ca TDC  -No Heparin -Mircera 150 mcg IV Q 2 weeks (last dose 07/15/2021) -Venofer 50 mg IV weekly (last dose 07/15/2021) -Calcitriol 0.5 mcg PO TIW  Assessment/Plan: Seizure-experienced a seizure during her last outpatient HD on 07/15/21.  Neurology following and EEG showed showed cortical dysfunction in the right frontal region . Altered Mental Status-Currently sleeping. CT head (-) acute intracranial abnormalities. Neurology re-consulted. Continue Valproate. Noted previous episodes of combative/agitation-this can negatively impact whether she will be able to continue outpatient HD. We need watch her mental status very closely-she needs to come  in a recliner next time. ESRD - on HD MWF. Plan for HD 07/20/21 per usual schedule. UF as tolerated. Hypertension/volume  - Patient euvolemic on exam, Bps improving-continue HTN medication-monitor BP and HR. Anemia of CKD - Hgb 9.4-recently received ESA and Fe in outpatient-monitor trends Secondary Hyperparathyroidism -  Ca 8.5-continue PO calcitriol.  Nutrition - Continue carb-modified renal diet. Albumin 2.6-continue protein supplements.  Madelon Lips MD [place] [facility] [phone] 07/20/2021,11:35 AM  LOS: 3 days

## 2021-07-20 NOTE — Progress Notes (Signed)
Upon re-check, pt CBG is 93. Day shift made aware & will continue monitoring.

## 2021-07-20 NOTE — Progress Notes (Addendum)
Neurology Progress Note  S: Patient can not participate in ROS due to mental status. We saw patient on 07/15/21 in consult for a breakthrough seizure in setting of missed dose of Keppra. We signed off after that consult.   However, due to son's concerns over patient's agitation, agressiveness, and hitting, the Keppra was changed to [place] on 07/17/21.    We were called back in today, to weigh in on patient's mental status.   O: Current vital signs: BP 117/90 (BP Location: Left Arm)   Pulse (!) 115   Temp (!) 97.5 F (36.4 C) (Oral)   Resp (!) 22   Wt 53.5 kg   SpO2 100%   BMI 21.57 kg/m  Vital signs in last 24 hours: Temp:  [97.5 F (36.4 C)-98.6 F (37 C)] 97.5 F (36.4 C) (08/15 0920) Pulse Rate:  [92-119] 115 (08/15 1100) Resp:  [16-22] 22 (08/15 1100) BP: (117-179)/(84-108) 117/90 (08/15 1100) SpO2:  [96 %-100 %] 100 % (08/15 0920) Weight:  [53.5 kg] 53.5 kg (08/15 0920)  GENERAL: Chronically ill appearing.  Awake, alert in NAD. Lying in bed in dialysis.  HEENT: Normocephalic and atraumatic. LUNGS: Normal respiratory effort.  CV: RRR on tele.  Ext: warm.  NEURO:  Abbreviated exam due to non cooperation. She is alert. Tells NP to get off her fingers. Her speech is just syllables, such as, m m m m, d, d, d, d, or n, n, n, n. She will not tell NP her name. She will turn her head side to side and tracks examiner.  Mental Status: Alert and will not answer orientation questions.   Speech/Language: as above. No dysarthria noted. Will not name objects or repeat phrases.   Cranial Nerves: Limited exam due to combativeness and agitation.  II: PERRL.  III, IV, VI: Eyelids elevate symmetrically.  V: Sensation is intact to face as she shakes her head when eyes and face are touched.  VII: Face is symmetrical when speaking and at rest.  VIII: hearing intact to voice. IX, X, XI, XII: Does not participate in exam.  Motor: Spontaneous movement noted to Right side, but not to left. No  movement on left with noxious stimuli. She grips around handrail on the right and grabs NP's labcoat.  Sensation: No withdrawal to noxious stimuli on the left side.   Medications  Current Facility-Administered Medications:    heparin sodium (porcine) 1000 UNIT/ML injection, , , ,    (feeding supplement) PROSource Plus liquid 30 mL, 30 mL, Oral, TID BM, Tobie Poet E, NP   0.9 %  sodium chloride infusion, 100 mL, Intravenous, PRN, Valentina Gu, NP   0.9 %  sodium chloride infusion, 100 mL, Intravenous, PRN, Valentina Gu, NP   acetaminophen (TYLENOL) tablet 650 mg, 650 mg, Oral, Q6H PRN, 650 mg at 07/18/21 1816 **OR** acetaminophen (TYLENOL) suppository 650 mg, 650 mg, Rectal, Q6H PRN, Shalhoub, Sherryll Burger, MD   alteplase (CATHFLO ACTIVASE) injection 2 mg, 2 mg, Intracatheter, Once PRN, Valentina Gu, NP   aspirin EC tablet 81 mg, 81 mg, Oral, Daily, Shalhoub, Sherryll Burger, MD   calcitRIOL (ROCALTROL) capsule 0.5 mcg, 0.5 mcg, Oral, Q M,W,F-HD, Valentina Gu, NP   Chlorhexidine Gluconate Cloth 2 % PADS 6 each, 6 each, Topical, Q0600, Valentina Gu, NP, 6 each at 07/20/21 0549   dextrose 5 % solution, , Intravenous, Continuous, Kayleen Memos, DO, Last Rate: 30 mL/hr at 07/20/21 0011, New Bag at 07/20/21 0011   glucagon (human  recombinant) (GLUCAGEN) 1 MG injection, , , ,    heparin injection 1,000 Units, 1,000 Units, Dialysis, PRN, Valentina Gu, NP   heparin injection 5,000 Units, 5,000 Units, Subcutaneous, Q8H, Shalhoub, Sherryll Burger, MD, 5,000 Units at 07/20/21 0548   latanoprost (XALATAN) 0.005 % ophthalmic solution 1 drop, 1 drop, Both Eyes, QHS, Shalhoub, Sherryll Burger, MD, 1 drop at 07/19/21 2138   letrozole [facility]) tablet 2.5 mg, 2.5 mg, Oral, Daily, Shalhoub, Sherryll Burger, MD   levothyroxine (SYNTHROID) tablet 112 mcg, 112 mcg, Oral, Q0600, Vernelle Emerald, MD, 112 mcg at 07/16/21 0547   lidocaine (PF) (XYLOCAINE) 1 % injection 5 mL, 5 mL, Intradermal,  PRN, Valentina Gu, NP   lidocaine-prilocaine (EMLA) cream 1 application, 1 application, Topical, PRN, Valentina Gu, NP   metoprolol succinate (TOPROL-XL) 24 hr tablet 50 mg, 50 mg, Oral, Daily, Shalhoub, Sherryll Burger, MD   ondansetron (ZOFRAN) tablet 4 mg, 4 mg, Oral, Q6H PRN **OR** ondansetron (ZOFRAN) injection 4 mg, 4 mg, Intravenous, Q6H PRN, Shalhoub, Sherryll Burger, MD, 4 mg at 07/17/21 2025   pentafluoroprop-tetrafluoroeth (GEBAUERS) aerosol 1 application, 1 application, Topical, PRN, Valentina Gu, NP   polyethylene glycol (MIRALAX / GLYCOLAX) packet 17 g, 17 g, Oral, Daily PRN, Shalhoub, Sherryll Burger, MD   promethazine (PHENERGAN) 6.25 mg in sodium chloride 0.9 % 50 mL IVPB, 6.25 mg, Intravenous, Q8H PRN, Hall, Carole N, DO   valproate (DEPACON) 500 mg in dextrose 5 % 50 mL IVPB, 500 mg, Intravenous, Q12H, Adhikari, Amrit, MD, Last Rate: 55 mL/hr at 07/19/21 2230, 500 mg at 07/19/21 2230  Pertinent Labs WBCC 11.7 (6.6).    TSH 0.429.   No new Imaging  Assessment: 75 yo female who presented with breakthrough seizure provoked by missing Keppra dose. Her Keppra was changed to [place] on 07/17/21 due to patient's somnolence and combativeness. Given Keppra washes out in about 44 hours, this should be out of her system and she is still combative. VA can cause drowsiness, but patient is awake and alert, just very confused. This is likely multifactorial due to delirium, seizure, uremia, recent stroke, and possible infection. However, given her remainder neurological deficit of encephalopathy and speech issues, we will r/p her CTH to compare to the one on admission. MRI brain would be optimal, but doubt she will be able to tolerate. Also, we will check a cEEG due to her speech changes and minimal improvement in mental status. She has leukocytosis today which is new.   Impression: -multifactorial toxic metabolic encephalopathy not back to baseline.  -Delirium.  -seizures.  -underlying dementia?    Recommendations/Plan:  -workup leukocytosis for infectious cause of encephalopathy.  -UA and culture, if she makes any urine on her own.  -CXR.  -r/p CTH without contrast.  -cEEG.  -Vitamin B1 and Vitamin B12 level.  -Start high dose thiamine after level drawn with eventual dose of Thiamine 100mg  po qd  -Replete Vit B12 is level < 500.  -Delirium precautions.  -Seizure precautions.  -Avoid sedative medications.  -Avoid medications on the Beer's list for the elderly.  -Will need neurology out patient f/up on discharge.   Pt seen by Clance Boll, MSN, APN-BC/Nurse Practitioner/Neuro and later by MD. Note and plan to be edited as needed by MD.  Pager: [phone]  I have seen the patient reviewed the above note.  She is very encephalopathic, she has some perseveration of speech.  This is slightly out of proportion to what I would typically expect with her  medical issues, but I still think a multifactorial delirium is possible.  With her presenting with seizure, I would favor continuous EEG, and she will also need further imaging.  Since she has not tolerated MRI, at this point I think a repeat CT could give additional information as well.  Roland Rack, MD [facility] [phone]  If 7pm- 7am, please page neurology on call as listed in [place].

## 2021-07-20 NOTE — Progress Notes (Signed)
Pt CBG 66. Administered 1 mg Glucagon IV, will re-check CBG in appx 15 min.

## 2021-07-20 NOTE — Progress Notes (Signed)
EEG complete - results pending 

## 2021-07-20 NOTE — Progress Notes (Addendum)
PROGRESS NOTE    Kirsten Mcmahon  OIN:[id number] DOB: Feb 07, 1946 DOA: 07/15/2021 PCP: [place]   Chief Complain:Seizure  Brief Narrative: Patient is a 75 year old female with history of ESRD on dialysis on Monday Wednesday Friday, history of breast cancer, history of stroke, diabetes type 2, seizure disorder who was brought to the emergency department after she had seizure-like activity during hemodialysis.  As per the report, patient ran out of her [place] on 8/9.  Patient had 30 minutes left in her hemodialysis session, she started to have seizure-like activity.  Hemodialysis was stopped and she was brought to the emergency department.  She was postictal on presentation, lethargic, disoriented.  CT head did not show any acute intracranial normalities.  Neurology was consulted who recommended to continue Keppra 500 mg daily and 250 mg postdialysis initially but changed the Keppra to Depacon  Patient is still encephalopathic, agitated.  Nephrology also following for dialysis.  Assessment & Plan:   Principal Problem:   Breakthrough seizure ([place]) Active Problems:   Essential hypertension   Hypothyroidism   Type 2 diabetes mellitus with ESRD (end-stage renal disease) (HCC)   End-stage renal disease on hemodialysis ([place])   Mixed diabetic hyperlipidemia associated with type 2 diabetes mellitus ([place])   Seizure ([place])   Altered mental status/agitation: Hospital course remarkable for persistent confusion, agitation.  Usually alert and awake  at baseline but her mental status fluctuates.   Son thinks that she is intolerant to [place] and has been skipping this medicine at home.  CT head on presentation did not show any acute intracranial abnormalities.  Reconsulted neurology.  Keppra stopped and started on valproate.Continue delirium precaution. Still confused and sleepy today.  She did not wake up on calling her name.  We need to avoid sedatives, narcotics as much as  possible. Unclear etiology for persistent encephalopathy.  Neurology recommended infectious work-up: Including UA, chest x-ray.  She has mild leukocytosis, afebrile. Will order MRI brain, Vitamin B12,folic acid  Seizure: Had seizure activity while at dialysis.  She recently ran out of [place] on 8/9.  EEG showed cortical dysfunction in the right frontal region, moderate diffuse encephalopathy, no seizures or epileptiform discharges.  Currently on valproate  Hypoglycemia: Unknown etiology. Not on any meds at home. Hemoglobin A1c of 5.6 as per 6/22.  Was started on gentle D10,now changed to D5 which we will continue because of poor oral intake.  Currently blood sugars are more stable.  ESRD on dialysis: Has tunneled dialysis catheter on the right chest.  Nephrology consulted and following for dialysis.  History of hypertension: Continue current medication.  Monitor blood pressure  Hypothyroidism: Continue levothyroxine.TSH on 8/10 is normal  H/O stroke: She is weak on left side,walks with cane. Usually alert and awake ,usually oriented but sometimes confused  Goals of care: Elderly patient with multiple comorbidities, on dialysis, encephalopathic, poor oral intake.  Still a full code.  I have consulted palliative care for goals of care discussion        DVT prophylaxis:Heparin [place] Code Status: Full Family Communication: Discussed with son on phone on 07/18/21    Dispo: The patient is from: Home              Anticipated d/c is to: Home              Patient currently is not medically stable to d/c.   Difficult to place patient No     Consultants: neurology  Procedures:None  Antimicrobials:  Anti-infectives (From  admission, onward)    None       Subjective: Patient seen and examined the bedside this morning.  She was on dialysis during my evaluation.  She barely opened her eyes when I call her name.  As per the RN, she is agitated and aggressive when she wakes  up.  Objective: Vitals:   07/19/21 1520 07/19/21 1900 07/20/21 0000 07/20/21 0400  BP: (!) 154/90 (!) 162/92 (!) 166/94 (!) 179/94  Pulse: (!) 105 97 99 92  Resp: 18 18 18 18   Temp: 98.6 F (37 C) 98 F (36.7 C) 97.8 F (36.6 C) 97.6 F (36.4 C)  TempSrc:  Axillary Axillary Axillary  SpO2: 100% 100% 100% 98%  Weight:        Intake/Output Summary (Last 24 hours) at 07/20/2021 0741 Last data filed at 07/19/2021 1246 Gross per 24 hour  Intake 180 ml  Output --  Net 180 ml    Filed Weights   07/18/21 0144 07/18/21 0600  Weight: 55.3 kg 55 kg    Examination:  GGeneral exam: Eyes closed, sleepy, barely responsive to verbal stimuli Respiratory system:  no wheezes or crackles  Cardiovascular system: S1 & S2 heard, RRR.  Tunneled dialysis catheter on the right chest Gastrointestinal system: Abdomen is nondistended, soft and nontender. Central nervous system: Alert and oriented Extremities: No edema, no clubbing ,no cyanosis Skin: No rashes, no ulcers,no icterus      Data Reviewed: I have personally reviewed following labs and imaging studies  CBC: Recent Labs  Lab 07/15/21 1747 07/16/21 0530  WBC 8.6 6.6  NEUTROABS 7.0 3.8  HGB 9.8* 9.4*  HCT 32.1* 31.4*  MCV 95.8 97.2  PLT 156 294   Basic Metabolic Panel: Recent Labs  Lab 07/15/21 1747 07/16/21 0530 07/18/21 0435  NA 137 138  --   K 3.5 3.8  --   CL 102 103  --   CO2 26 27  --   GLUCOSE 109* 85  --   BUN 10 13  --   CREATININE 2.61* 3.52*  --   CALCIUM 8.4* 8.5*  --   MG  --  2.0  --   PHOS  --   --  3.8   GFR: Estimated Creatinine Clearance: 10.6 mL/min (A) (by C-G formula based on SCr of 3.52 mg/dL (H)). Liver Function Tests: Recent Labs  Lab 07/16/21 0530  AST 23  ALT 22  ALKPHOS 60  BILITOT 0.5  PROT 6.1*  ALBUMIN 2.6*   No results for input(s): LIPASE, AMYLASE in the last 168 hours. No results for input(s): AMMONIA in the last 168 hours. Coagulation Profile: No results for input(s):  INR, PROTIME in the last 168 hours. Cardiac Enzymes: No results for input(s): CKTOTAL, CKMB, CKMBINDEX, TROPONINI in the last 168 hours. BNP (last 3 results) No results for input(s): PROBNP in the last 8760 hours. HbA1C: No results for input(s): HGBA1C in the last 72 hours. CBG: Recent Labs  Lab 07/19/21 1127 07/19/21 1544 07/19/21 2208 07/20/21 0643 07/20/21 0719  GLUCAP 78 76 81 66* 93   Lipid Profile: No results for input(s): CHOL, HDL, LDLCALC, TRIG, CHOLHDL, LDLDIRECT in the last 72 hours. Thyroid Function Tests: No results for input(s): TSH, T4TOTAL, FREET4, T3FREE, THYROIDAB in the last 72 hours.  Anemia Panel: No results for input(s): VITAMINB12, FOLATE, FERRITIN, TIBC, IRON, RETICCTPCT in the last 72 hours. Sepsis Labs: No results for input(s): PROCALCITON, LATICACIDVEN in the last 168 hours.  Recent Results (from the past 240 hour(s))  Resp Panel by RT-PCR (Flu A&B, Covid) Nasopharyngeal Swab     Status: None   Collection Time: 07/15/21  5:50 PM   Specimen: Nasopharyngeal Swab; Nasopharyngeal(NP) swabs in vial transport medium  Result Value Ref Range Status   SARS Coronavirus 2 by RT PCR NEGATIVE NEGATIVE Final    Comment: (NOTE) SARS-CoV-2 target nucleic acids are NOT DETECTED.  The SARS-CoV-2 RNA is generally detectable in upper respiratory specimens during the acute phase of infection. The lowest concentration of SARS-CoV-2 viral copies this assay can detect is 138 copies/mL. A negative result does not preclude SARS-Cov-2 infection and should not be used as the sole basis for treatment or other patient management decisions. A negative result may occur with  improper specimen collection/handling, submission of specimen other than nasopharyngeal swab, presence of viral mutation(s) within the areas targeted by this assay, and inadequate number of viral copies(<138 copies/mL). A negative result must be combined with clinical observations, patient history, and  epidemiological information. The expected result is Negative.  Fact Sheet for Patients:  EntrepreneurPulse.com.au  Fact Sheet for Healthcare Providers:  IncredibleEmployment.be  This test is no t yet approved or cleared by the [place] FDA and  has been authorized for detection and/or diagnosis of SARS-CoV-2 by FDA under an Emergency Use Authorization (EUA). This EUA will remain  in effect (meaning this test can be used) for the duration of the COVID-19 declaration under Section 564(b)(1) of the Act, 21 U.S.C.section 360bbb-3(b)(1), unless the authorization is terminated  or revoked sooner.       Influenza A by PCR NEGATIVE NEGATIVE Final   Influenza B by PCR NEGATIVE NEGATIVE Final    Comment: (NOTE) The Xpert Xpress SARS-CoV-2/FLU/RSV plus assay is intended as an aid in the diagnosis of influenza from Nasopharyngeal swab specimens and should not be used as a sole basis for treatment. Nasal washings and aspirates are unacceptable for Xpert Xpress SARS-CoV-2/FLU/RSV testing.  Fact Sheet for Patients: EntrepreneurPulse.com.au  Fact Sheet for Healthcare Providers: IncredibleEmployment.be  This test is not yet approved or cleared by the [place] FDA and has been authorized for detection and/or diagnosis of SARS-CoV-2 by FDA under an Emergency Use Authorization (EUA). This EUA will remain in effect (meaning this test can be used) for the duration of the COVID-19 declaration under Section 564(b)(1) of the Act, 21 U.S.C. section 360bbb-3(b)(1), unless the authorization is terminated or revoked.  Performed at [facility], [place] [address] [place], [place] [address]          Radiology Studies: No results found.      Scheduled Meds:  (feeding supplement) PROSource Plus  30 mL Oral TID BM   aspirin EC  81 mg Oral Daily   calcitRIOL  0.5 mcg Oral Q M,W,F-HD   Chlorhexidine  Gluconate Cloth  6 each Topical Q0600   glucagon (human recombinant)       heparin  5,000 Units Subcutaneous Q8H   insulin aspart  0-6 Units Subcutaneous TID AC & HS   latanoprost  1 drop Both Eyes QHS   letrozole  2.5 mg Oral Daily   levothyroxine  112 mcg Oral Q0600   metoprolol succinate  50 mg Oral Daily   Continuous Infusions:  sodium chloride     sodium chloride     dextrose 30 mL/hr at 07/20/21 0011   promethazine (PHENERGAN) injection (IM or IVPB)     valproate sodium 500 mg (07/19/21 2230)     LOS: 3 days    Time  spent: 35 mins.More than 50% of that time was spent in counseling and/or coordination of care.      Shelly Coss, MD [facility] P8/15/2022, 7:41 AM

## 2021-07-20 NOTE — Care Management Important Message (Signed)
Important Message  Patient Details  Name: Kirsten Mcmahon MRN: [id number] Date of Birth: [birth year]   Medicare Important Message Given:  Yes     Orbie Pyo 07/20/2021, 4:30 PM

## 2021-07-20 NOTE — Progress Notes (Signed)
LTM EEG hooked up and running - no initial skin breakdown - push button tested - neuro notified. [facility] monitoring.  

## 2021-07-21 ENCOUNTER — Inpatient Hospital Stay (HOSPITAL_COMMUNITY): Payer: Medicare (Managed Care)

## 2021-07-21 LAB — VALPROIC ACID LEVEL: Valproic Acid Lvl: 71 ug/mL (ref 50.0–100.0)

## 2021-07-21 LAB — GLUCOSE, CAPILLARY
Glucose-Capillary: 69 mg/dL — ABNORMAL LOW (ref 70–99)
Glucose-Capillary: 98 mg/dL (ref 70–99)
Glucose-Capillary: 65 mg/dL — ABNORMAL LOW (ref 70–99)
Glucose-Capillary: 64 mg/dL — ABNORMAL LOW (ref 70–99)
Glucose-Capillary: 108 mg/dL — ABNORMAL HIGH (ref 70–99)

## 2021-07-21 LAB — VITAMIN B12: Vitamin B-12: 821 pg/mL (ref 180–914)

## 2021-07-21 LAB — AMMONIA: Ammonia: 21 umol/L (ref 9–35)

## 2021-07-21 MED ORDER — ASPIRIN 81 MG PO CHEW
81.0000 mg | CHEWABLE_TABLET | Freq: Every day | ORAL | Status: DC
  Administered 2021-07-22 – 2021-07-31 (×10): 81 mg via ORAL
  Filled 2021-07-21 (×11): qty 1

## 2021-07-21 MED ORDER — DEXTROSE 50 % IV SOLN: 12.5000 g | INTRAVENOUS | Status: AC

## 2021-07-21 MED ORDER — LABETALOL HCL 5 MG/ML IV SOLN: 10.0000 mg | INTRAVENOUS | Status: DC | PRN

## 2021-07-21 MED ORDER — DEXTROSE 50 % IV SOLN
INTRAVENOUS | Status: AC
  Administered 2021-07-21: 12.5 g via INTRAVENOUS
  Filled 2021-07-21: qty 50

## 2021-07-21 MED ORDER — DEXTROSE 50 % IV SOLN
INTRAVENOUS | Status: AC
  Administered 2021-07-21: 25 mL
  Filled 2021-07-21: qty 50

## 2021-07-21 MED ORDER — LORAZEPAM 2 MG/ML IJ SOLN
1.0000 mg | Freq: Once | INTRAMUSCULAR | Status: AC
  Administered 2021-07-21: 1 mg via INTRAVENOUS
  Filled 2021-07-21: qty 1

## 2021-07-21 NOTE — Care Management Important Message (Signed)
Important Message  Patient Details  Name: Kirsten Mcmahon MRN: [id number] Date of Birth: 07/04/1946   Medicare Important Message Given:  Yes  Due to illness patient could not sign.  Signed copy left at the patient bedside.   Breyah Akhter 07/21/2021, 3:05 PM

## 2021-07-21 NOTE — Progress Notes (Signed)
PROGRESS NOTE    Kirsten Mcmahon  YHC:[id number] DOB: [birth year] DOA: 07/15/2021 PCP: [place]   Chief Complain:Seizure  Brief Narrative: Patient is a 75 year old female with history of ESRD on dialysis on Monday Wednesday Friday, history of breast cancer, history of stroke, diabetes type 2, seizure disorder who was brought to the emergency department after she had seizure-like activity during hemodialysis.  As per the report, patient ran out of her [place] on 8/9.  Patient had 30 minutes left in her hemodialysis session, she started to have seizure-like activity.  Hemodialysis was stopped and she was brought to the emergency department.  She was postictal on presentation, lethargic, disoriented.  CT head did not show any acute intracranial normalities.  Neurology was consulted who recommended to continue Keppra 500 mg daily and 250 mg postdialysis initially but changed the Keppra to Depacon  Patient is still encephalopathic, agitated.  Nephrology also following for dialysis.On continous EEG  Assessment & Plan:   Principal Problem:   Breakthrough seizure ([place]) Active Problems:   Essential hypertension   Hypothyroidism   Type 2 diabetes mellitus with ESRD (end-stage renal disease) (HCC)   End-stage renal disease on hemodialysis ([place])   Mixed diabetic hyperlipidemia associated with type 2 diabetes mellitus ([place])   Seizure ([place])   Altered mental status/agitation: Hospital course remarkable for persistent confusion, agitation.  Usually alert and awake  at baseline but her mental status fluctuates.   Son thinks that she is intolerant to [place] and has been skipping this medicine at home.  CT head on presentation did not show any acute intracranial abnormalities.  Reconsulted neurology.  Keppra stopped and started on valproate.Continue delirium precaution. Still confused and sleepy today.  She did not wake up on calling her name.  We need to avoid sedatives, narcotics as much  as possible. Unclear etiology for persistent encephalopathy.  Neurology recommended infectious work-up: Including UA, chest x-ray. CXR did not show PNA .She has mild leukocytosis, afebrile. Ordered MRI brain if she cudnt tolerate, f/u Vitamin B12,Vitamin B1. Started on continous EEG,Started on high dose thiamine  Seizure: Had seizure activity while at dialysis.  She recently ran out of [place] on 8/9.  EEG showed cortical dysfunction in the right frontal region, moderate diffuse encephalopathy, no seizures or epileptiform discharges.  Currently on valproate  Hypoglycemia: Unknown etiology. Not on any meds at home. Hemoglobin A1c of 5.6 as per 6/22.  Was started on gentle D10,now changed to D5 which we will continue because of poor oral intake.  Currently blood sugars are more stable.  ESRD on dialysis: Has tunneled dialysis catheter on the right chest.  Nephrology consulted and following for dialysis.  History of hypertension: Continue current medication.  Monitor blood pressure  Hypothyroidism: Continue levothyroxine.TSH on 8/10 is normal  H/O stroke: She is weak on left side,walks with cane. As per son,she is usually alert and awake ,usually oriented but sometimes confused  Goals of care: Elderly patient with multiple comorbidities, on dialysis, encephalopathic, poor oral intake.  Still a full code.  I have consulted palliative care for goals of care discussion        DVT prophylaxis:Heparin [place] Code Status: Full Family Communication: Discussed with son on phone on 07/20/21 on phone    Dispo: The patient is from: Home              Anticipated d/c is to: Home              Patient currently is not  medically stable to d/c.   Difficult to place patient No     Consultants: neurology  Procedures:None  Antimicrobials:  Anti-infectives (From admission, onward)    None       Subjective:  Patient seen and examined the bedside this morning.  Hemodynamically stable.  On  continuous EEG.  She opened her eyes on calling her name.  But is still sleepy/drowsy/confused  Objective: Vitals:   07/20/21 1548 07/20/21 2037 07/21/21 0038 07/21/21 0432  BP: (!) 150/105 (!) 163/109 (!) 154/109 (!) 161/111  Pulse: 97  (!) 102 (!) 109  Resp: 18 18 20 19   Temp: 97.6 F (36.4 C) 98.2 F (36.8 C) 98 F (36.7 C) 98 F (36.7 C)  TempSrc:   Axillary Axillary  SpO2: 100% 100% 96% 97%  Weight:        Intake/Output Summary (Last 24 hours) at 07/21/2021 0744 Last data filed at 07/20/2021 1230 Gross per 24 hour  Intake --  Output 500 ml  Net -500 ml    Filed Weights   07/18/21 0600 07/20/21 0920 07/20/21 1230  Weight: 55 kg 53.5 kg 53 kg    Examination:  General exam: Encephalopathy, confused, on EEG leads HEENT: PERRL Respiratory system:  no wheezes or crackles  Cardiovascular system: S1 & S2 heard, RRR.  Tunneled dialysis catheter on the right chest Gastrointestinal system: Abdomen is nondistended, soft and nontender. Central nervous system: Not Alert and oriented Extremities: No edema, no clubbing ,no cyanosis Skin: No rashes, no ulcers,no icterus       Data Reviewed: I have personally reviewed following labs and imaging studies  CBC: Recent Labs  Lab 07/15/21 1747 07/16/21 0530 07/20/21 0934  WBC 8.6 6.6 11.7*  NEUTROABS 7.0 3.8  --   HGB 9.8* 9.4* 11.3*  HCT 32.1* 31.4* 35.9*  MCV 95.8 97.2 93.5  PLT 156 173 423   Basic Metabolic Panel: Recent Labs  Lab 07/15/21 1747 07/16/21 0530 07/18/21 0435 07/20/21 0934  NA 137 138  --  133*  K 3.5 3.8  --  3.9  CL 102 103  --  97*  CO2 26 27  --  27  GLUCOSE 109* 85  --  94  BUN 10 13  --  18  CREATININE 2.61* 3.52*  --  4.87*  CALCIUM 8.4* 8.5*  --  8.4*  MG  --  2.0  --   --   PHOS  --   --  3.8 3.9   GFR: Estimated Creatinine Clearance: 7.7 mL/min (A) (by C-G formula based on SCr of 4.87 mg/dL (H)). Liver Function Tests: Recent Labs  Lab 07/16/21 0530 07/20/21 0934  AST 23  --    ALT 22  --   ALKPHOS 60  --   BILITOT 0.5  --   PROT 6.1*  --   ALBUMIN 2.6* 2.6*   No results for input(s): LIPASE, AMYLASE in the last 168 hours. No results for input(s): AMMONIA in the last 168 hours. Coagulation Profile: No results for input(s): INR, PROTIME in the last 168 hours. Cardiac Enzymes: No results for input(s): CKTOTAL, CKMB, CKMBINDEX, TROPONINI in the last 168 hours. BNP (last 3 results) No results for input(s): PROBNP in the last 8760 hours. HbA1C: No results for input(s): HGBA1C in the last 72 hours. CBG: Recent Labs  Lab 07/19/21 1544 07/19/21 2208 07/20/21 0643 07/20/21 0719 07/20/21 1546  GLUCAP 76 81 66* 93 89   Lipid Profile: No results for input(s): CHOL, HDL, LDLCALC, TRIG, CHOLHDL, LDLDIRECT  in the last 72 hours. Thyroid Function Tests: No results for input(s): TSH, T4TOTAL, FREET4, T3FREE, THYROIDAB in the last 72 hours.  Anemia Panel: No results for input(s): VITAMINB12, FOLATE, FERRITIN, TIBC, IRON, RETICCTPCT in the last 72 hours. Sepsis Labs: No results for input(s): PROCALCITON, LATICACIDVEN in the last 168 hours.  Recent Results (from the past 240 hour(s))  Resp Panel by RT-PCR (Flu A&B, Covid) Nasopharyngeal Swab     Status: None   Collection Time: 07/15/21  5:50 PM   Specimen: Nasopharyngeal Swab; Nasopharyngeal(NP) swabs in vial transport medium  Result Value Ref Range Status   SARS Coronavirus 2 by RT PCR NEGATIVE NEGATIVE Final    Comment: (NOTE) SARS-CoV-2 target nucleic acids are NOT DETECTED.  The SARS-CoV-2 RNA is generally detectable in upper respiratory specimens during the acute phase of infection. The lowest concentration of SARS-CoV-2 viral copies this assay can detect is 138 copies/mL. A negative result does not preclude SARS-Cov-2 infection and should not be used as the sole basis for treatment or other patient management decisions. A negative result may occur with  improper specimen collection/handling, submission  of specimen other than nasopharyngeal swab, presence of viral mutation(s) within the areas targeted by this assay, and inadequate number of viral copies(<138 copies/mL). A negative result must be combined with clinical observations, patient history, and epidemiological information. The expected result is Negative.  Fact Sheet for Patients:  EntrepreneurPulse.com.au  Fact Sheet for Healthcare Providers:  IncredibleEmployment.be  This test is no t yet approved or cleared by the [place] FDA and  has been authorized for detection and/or diagnosis of SARS-CoV-2 by FDA under an Emergency Use Authorization (EUA). This EUA will remain  in effect (meaning this test can be used) for the duration of the COVID-19 declaration under Section 564(b)(1) of the Act, 21 U.S.C.section 360bbb-3(b)(1), unless the authorization is terminated  or revoked sooner.       Influenza A by PCR NEGATIVE NEGATIVE Final   Influenza B by PCR NEGATIVE NEGATIVE Final    Comment: (NOTE) The Xpert Xpress SARS-CoV-2/FLU/RSV plus assay is intended as an aid in the diagnosis of influenza from Nasopharyngeal swab specimens and should not be used as a sole basis for treatment. Nasal washings and aspirates are unacceptable for Xpert Xpress SARS-CoV-2/FLU/RSV testing.  Fact Sheet for Patients: EntrepreneurPulse.com.au  Fact Sheet for Healthcare Providers: IncredibleEmployment.be  This test is not yet approved or cleared by the [place] FDA and has been authorized for detection and/or diagnosis of SARS-CoV-2 by FDA under an Emergency Use Authorization (EUA). This EUA will remain in effect (meaning this test can be used) for the duration of the COVID-19 declaration under Section 564(b)(1) of the Act, 21 U.S.C. section 360bbb-3(b)(1), unless the authorization is terminated or revoked.  Performed at [facility], [place] [address]  [place], [place] [address]          Radiology Studies: CT HEAD WO CONTRAST (5MM)  Result Date: 07/20/2021 CLINICAL DATA:  Seizure EXAM: CT HEAD WITHOUT CONTRAST TECHNIQUE: Contiguous axial images were obtained from the base of the skull through the vertex without intravenous contrast. COMPARISON:  07/16/2021 FINDINGS: Brain: Old right MCA territory infarct on background of severe chronic small vessel disease. There are multiple old cerebellar infarcts. Diffuse generalized atrophy. No acute hemorrhage. Vascular: No abnormal hyperdensity of the major intracranial arteries or dural venous sinuses. No intracranial atherosclerosis. Skull: The visualized skull base, calvarium and extracranial soft tissues are normal. Sinuses/Orbits: No fluid levels or advanced mucosal thickening  of the visualized paranasal sinuses. No mastoid or middle ear effusion. The orbits are normal. IMPRESSION: 1. No acute intracranial abnormality. 2. Old right MCA territory infarct and multiple old cerebellar infarcts. 3. Severe chronic small vessel disease and generalized atrophy. Electronically Signed   By: Ulyses Jarred M.D.   On: 07/20/2021 19:25   DG CHEST PORT 1 VIEW  Result Date: 07/20/2021 CLINICAL DATA:  Confusion EXAM: PORTABLE CHEST 1 VIEW COMPARISON:  05/17/2021 FINDINGS: Right neck catheter tip overlies the right [facility]. Unchanged, enlarged cardiac silhouette. No focal airspace disease. No large pleural effusion or visible pneumothorax. Bilateral shoulder degenerative changes. No acute osseous abnormality. IMPRESSION: No evidence of acute cardiopulmonary disease. Electronically Signed   By: Maurine Simmering M.D.   On: 07/20/2021 16:39        Scheduled Meds:  (feeding supplement) PROSource Plus  30 mL Oral TID BM   aspirin EC  81 mg Oral Daily   calcitRIOL  0.5 mcg Oral Q M,W,F-HD   Chlorhexidine Gluconate Cloth  6 each Topical Q0600   heparin  5,000 Units Subcutaneous Q8H   latanoprost  1 drop Both Eyes QHS    letrozole  2.5 mg Oral Daily   levothyroxine  112 mcg Oral Q0600   LORazepam  1 mg Intravenous Once   metoprolol succinate  50 mg Oral Daily   [START ON 07/28/2021] thiamine injection  100 mg Intravenous Daily   Continuous Infusions:  dextrose 30 mL/hr at 07/20/21 0011   promethazine (PHENERGAN) injection (IM or IVPB)     thiamine injection 500 mg (07/21/21 0521)   Followed by   Derrill Memo ON 07/22/2021] thiamine injection     valproate sodium 500 mg (07/20/21 2226)     LOS: 4 days    Time spent: 25 mins.More than 50% of that time was spent in counseling and/or coordination of care.      Shelly Coss, MD [facility] P8/16/2022, 7:44 AM

## 2021-07-21 NOTE — Progress Notes (Signed)
LTM EEG discontinued - no skin breakdown at unhook. [facility] notified 

## 2021-07-21 NOTE — Procedures (Addendum)
Patient Name: Kirsten Mcmahon  MRN: [id number]  Epilepsy Attending: Lora Havens  Referring Physician/Provider: Clance Boll, NP Duration: 07/20/2021 2035 to 07/21/2021 1743  Patient history: 75 yo female who presented with breakthrough seizure provoked by missing Keppra dose.  EEG to evaluate for seizures.  Level of alertness: Awake, asleep  AEDs during EEG study: VPA  Technical aspects: This EEG study was done with scalp electrodes positioned according to the 10-20 International system of electrode placement. Electrical activity was acquired at a sampling rate of 500Hz  and reviewed with a high frequency filter of 70Hz  and a low frequency filter of 1Hz . EEG data were recorded continuously and digitally stored.   Description: No posterior dominant rhythm was seen.  Sleep was characterized by vertex waves, sleep spindles (12-14 Hz, maximal frontocentral region. EEG showed continuous generalized and lateralized right hemisphere 3 to 6 Hz theta-delta slowing. There are also sharply contoured waves in the right centro-parietal region consistent with underlying breach artifact.  Hyperventilation and photic stimulation were not performed.     ABNORMALITY -Breach artifact, right centro-parietal region -Continuous slow, generalized and lateralized right hemisphere  IMPRESSION: This study is suggestive of cortical dysfunction in right centro-parietal region consistent with prior craniotomy and underlying stroke.there is also moderate degree of encephalopathy, nonspecific etiology.  No seizures or definite epileptiform discharges were seen throughout the recording.  [name] [name]

## 2021-07-21 NOTE — Progress Notes (Signed)
Maint complete. 

## 2021-07-21 NOTE — Progress Notes (Signed)
[facility] Progress Note   Subjective:     Seen in room.  CT head done last night which was negative for changes.  On cEEG this AM.  Was more responsive for RN yesterday and now is pretty somnolent- discussed with RN, primary team notified.  Tolerated HD well yesterday.    Objective Vitals:   07/20/21 2037 07/21/21 0038 07/21/21 0432 07/21/21 0844  BP: (!) 163/109 (!) 154/109 (!) 161/111 (!) 137/98  Pulse:  (!) 102 (!) 109 99  Resp: 18 20 19 17   Temp: 98.2 F (36.8 C) 98 F (36.7 C) 98 F (36.7 C) 98.4 F (36.9 C)  TempSrc:  Axillary Axillary Axillary  SpO2: 100% 96% 97% 98%  Weight:       Physical Exam General: frail-appearing, somnolent Lungs: clear bilaterally Heart: RRR. No murmur, rubs or gallops.  Abdomen: soft, nontender, +BS Lower extremities:no edema  Neuro: somnolent Dialysis Access: [facility]  Filed Weights   07/18/21 0600 07/20/21 0920 07/20/21 1230  Weight: 55 kg 53.5 kg 53 kg    Intake/Output Summary (Last 24 hours) at 07/21/2021 1146 Last data filed at 07/21/2021 0700 Gross per 24 hour  Intake 1090.68 ml  Output 500 ml  Net 590.68 ml    Additional Objective Labs: Basic Metabolic Panel: Recent Labs  Lab 07/15/21 1747 07/16/21 0530 07/18/21 0435 07/20/21 0934  NA 137 138  --  133*  K 3.5 3.8  --  3.9  CL 102 103  --  97*  CO2 26 27  --  27  GLUCOSE 109* 85  --  94  BUN 10 13  --  18  CREATININE 2.61* 3.52*  --  4.87*  CALCIUM 8.4* 8.5*  --  8.4*  PHOS  --   --  3.8 3.9   Liver Function Tests: Recent Labs  Lab 07/16/21 0530 07/20/21 0934  AST 23  --   ALT 22  --   ALKPHOS 60  --   BILITOT 0.5  --   PROT 6.1*  --   ALBUMIN 2.6* 2.6*   No results for input(s): LIPASE, AMYLASE in the last 168 hours. CBC: Recent Labs  Lab 07/15/21 1747 07/16/21 0530 07/20/21 0934  WBC 8.6 6.6 11.7*  NEUTROABS 7.0 3.8  --   HGB 9.8* 9.4* 11.3*  HCT 32.1* 31.4* 35.9*  MCV 95.8 97.2 93.5  PLT 156 173 241   Blood Culture     Component Value Date/Time   SDES BLOOD LEFT FOREARM 05/17/2021 0953   SPECREQUEST  05/17/2021 0953    BOTTLES DRAWN AEROBIC AND ANAEROBIC Blood Culture adequate volume   CULT  05/17/2021 0953    NO GROWTH 5 DAYS Performed at [facility], [place] [address] [place], [place] [address]    REPTSTATUS 05/22/2021 FINAL 05/17/2021 0953    Cardiac Enzymes: No results for input(s): CKTOTAL, CKMB, CKMBINDEX, TROPONINI in the last 168 hours. CBG: Recent Labs  Lab 07/19/21 2208 07/20/21 0643 07/20/21 0719 07/20/21 1546 07/21/21 1136  GLUCAP 81 66* 93 89 69*   Iron Studies: No results for input(s): IRON, TIBC, TRANSFERRIN, FERRITIN in the last 72 hours. Lab Results  Component Value Date   INR 1.0 04/30/2021   Studies/Results: CT HEAD WO CONTRAST (5MM)  Result Date: 07/20/2021 CLINICAL DATA:  Seizure EXAM: CT HEAD WITHOUT CONTRAST TECHNIQUE: Contiguous axial images were obtained from the base of the skull through the vertex without intravenous contrast. COMPARISON:  07/16/2021 FINDINGS: Brain: Old right MCA territory infarct on background  of severe chronic small vessel disease. There are multiple old cerebellar infarcts. Diffuse generalized atrophy. No acute hemorrhage. Vascular: No abnormal hyperdensity of the major intracranial arteries or dural venous sinuses. No intracranial atherosclerosis. Skull: The visualized skull base, calvarium and extracranial soft tissues are normal. Sinuses/Orbits: No fluid levels or advanced mucosal thickening of the visualized paranasal sinuses. No mastoid or middle ear effusion. The orbits are normal. IMPRESSION: 1. No acute intracranial abnormality. 2. Old right MCA territory infarct and multiple old cerebellar infarcts. 3. Severe chronic small vessel disease and generalized atrophy. Electronically Signed   By: Ulyses Jarred M.D.   On: 07/20/2021 19:25   DG CHEST PORT 1 VIEW  Result Date: 07/20/2021 CLINICAL DATA:  Confusion EXAM: PORTABLE CHEST 1 VIEW  COMPARISON:  05/17/2021 FINDINGS: Right neck catheter tip overlies the right atrium. Unchanged, enlarged cardiac silhouette. No focal airspace disease. No large pleural effusion or visible pneumothorax. Bilateral shoulder degenerative changes. No acute osseous abnormality. IMPRESSION: No evidence of acute cardiopulmonary disease. Electronically Signed   By: Maurine Simmering M.D.   On: 07/20/2021 16:39   EEG adult  Result Date: 07/21/2021 Lora Havens, MD     07/21/2021  8:27 AM Patient Name: Kirsten Mcmahon MRN: [id number] Epilepsy Attending: Lora Havens Referring Physician/Provider: Clance Boll, NP Date: 07/21/2021 Duration: 21.42 mins Patient history: 75 yo female who presented with breakthrough seizure provoked by missing Keppra dose.  EEG to evaluate for seizures. Level of alertness: Awake AEDs during EEG study: VPA Technical aspects: This EEG study was done with scalp electrodes positioned according to the 10-20 International system of electrode placement. Electrical activity was acquired at a sampling rate of 500Hz  and reviewed with a high frequency filter of 70Hz  and a low frequency filter of 1Hz . EEG data were recorded continuously and digitally stored. Description: No posterior dominant rhythm was seen.  EEG showed continuous generalized and lateralized right hemisphere 3 to 6 Hz theta-delta slowing. There are also sharply contoured waves in the right centro-parietal region consistent with underlying breach artifact.  Hyperventilation and photic stimulation were not performed.   ABNORMALITY -Breach artifact, right centro-parietal region -Continuous slow, generalized and lateralized right hemisphere IMPRESSION: This study is suggestive of cortical dysfunction in right centro-parietal region consistent with prior craniotomy and underlying stroke.there is also moderate degree of encephalopathy, nonspecific etiology.  No seizures or definite epileptiform discharges were seen throughout the  recording. [name]    Medications:  dextrose 30 mL/hr at 07/20/21 0011   promethazine (PHENERGAN) injection (IM or IVPB)     thiamine injection 500 mg (07/21/21 0521)   Followed by   Derrill Memo ON 07/22/2021] thiamine injection     valproate sodium 500 mg (07/20/21 2226)    (feeding supplement) PROSource Plus  30 mL Oral TID BM   aspirin EC  81 mg Oral Daily   calcitRIOL  0.5 mcg Oral Q M,W,F-HD   Chlorhexidine Gluconate Cloth  6 each Topical Q0600   heparin  5,000 Units Subcutaneous Q8H   latanoprost  1 drop Both Eyes QHS   letrozole  2.5 mg Oral Daily   levothyroxine  112 mcg Oral Q0600   LORazepam  1 mg Intravenous Once   LORazepam  1 mg Intravenous Once   metoprolol succinate  50 mg Oral Daily   [START ON 07/28/2021] thiamine injection  100 mg Intravenous Daily    Dialysis Orders: HD Orders: [name] MWF 3:45 hr 180 NRE 400/[place]oflow 1.5 56 kg 3.0 K/2.5 Ca TDC  -  No Heparin -Mircera 150 mcg IV Q 2 weeks (last dose 07/15/2021) -Venofer 50 mg IV weekly (last dose 07/15/2021) -Calcitriol 0.5 mcg PO TIW  Assessment/Plan: Seizure-experienced a seizure during her last outpatient HD on 07/15/21.  Neurology following, on cEEG. Altered Mental Status-Currently sleeping. CT head (-) acute intracranial abnormalities x 2, last 07/20/21.  Neurology re-consulted. Continue Valproate. Noted previous episodes of combative/agitation-this can negatively impact whether she will be able to continue outpatient HD. We need watch her mental status very closely-she needs to come in a recliner next time. ESRD - on HD MWF. Plan for HD 07/22/21 per usual schedule. UF as tolerated. Hypertension/volume  - Patient euvolemic on exam, Bps improving-continue HTN medication-monitor BP and HR. Anemia of CKD - Hgb 9.4-recently received ESA and Fe in outpatient-monitor trends Secondary Hyperparathyroidism -  Ca 8.5-continue PO calcitriol.  Nutrition - Continue carb-modified renal diet. Albumin 2.6-continue  protein supplements. Dispo: overall poor prognosis  Madelon Lips MD [place] [facility] [phone] 07/21/2021,11:46 AM  LOS: 4 days

## 2021-07-21 NOTE — Progress Notes (Addendum)
Neurology Progress Note  S: Unable to acquire a robust ROS due to patient's mental status.    O: Current vital signs: BP (!) 161/111   Pulse (!) 109   Temp 98 F (36.7 C) (Axillary)   Resp 19   Wt 53 kg   SpO2 97%   BMI 21.37 kg/m  Vital signs in last 24 hours: Temp:  [97.3 F (36.3 C)-98.3 F (36.8 C)] 98 F (36.7 C) (08/16 0432) Pulse Rate:  [97-123] 109 (08/16 0432) Resp:  [16-22] 19 (08/16 0432) BP: (117-164)/(84-111) 161/111 (08/16 0432) SpO2:  [96 %-100 %] 97 % (08/16 0432) Weight:  [53 kg-53.5 kg] 53 kg (08/15 1230)  GENERAL: Poor and chronically ill appearing female lying in bed, calm, with EEG leads on.  HEENT: Normocephalic and atraumatic. EEG leads on with stocking cap.  LUNGS: Normal respiratory effort.  CV: RRR. Ext: warm.   NEURO:  Mental Status: Alert . Eyes with upward gaze. When NP asks her to say her name, she says name. Says Lamar when her right leg or arm is moved. In restraints on right side. She squeezed NPs right hand when asked, but performs no other commands.  Speech/Language: One word phrases are without dysarthria. Unable to name, repeat. Comprehension is slowed.   Cranial Nerves:  II: PERRL. Visual fields full. III, IV, VI: Slight upward gaze but conjugate. Eyelids elevate symmetrically.  V, VII: Face is symmetrical at rest.   VIII: hearing intact to voice. IX, X: Opens mouth slightly, but not completely for exam. BM:WUXL not participate.  XII: Will not participate.  Motor/Sensation:   Squeezes with right hand, 4-/5. Spontaneous movement noted to right upper and lower extremity. Withdraws to noxious stimuli on right side.  Tone: is normal and bulk is normal. Gait- deferred.  Medications  Current Facility-Administered Medications:    (feeding supplement) PROSource Plus liquid 30 mL, 30 mL, Oral, TID BM, Adelfa Koh, NP   acetaminophen (TYLENOL) tablet 650 mg, 650 mg, Oral, Q6H PRN, 650 mg at 07/18/21 1816 **OR**  acetaminophen (TYLENOL) suppository 650 mg, 650 mg, Rectal, Q6H PRN, Shalhoub, Sherryll Burger, MD   aspirin EC tablet 81 mg, 81 mg, Oral, Daily, Shalhoub, Sherryll Burger, MD   calcitRIOL (ROCALTROL) capsule 0.5 mcg, 0.5 mcg, Oral, Q M,W,F-HD, Valentina Gu, NP   Chlorhexidine Gluconate Cloth 2 % PADS 6 each, 6 each, Topical, Q0600, Valentina Gu, NP, 6 each at 07/20/21 0549   dextrose 5 % solution, , Intravenous, Continuous, Kayleen Memos, DO, Last Rate: 30 mL/hr at 07/20/21 0011, New Bag at 07/20/21 0011   heparin injection 5,000 Units, 5,000 Units, Subcutaneous, Q8H, Shalhoub, Sherryll Burger, MD, 5,000 Units at 07/21/21 0531   latanoprost (XALATAN) 0.005 % ophthalmic solution 1 drop, 1 drop, Both Eyes, QHS, Shalhoub, Sherryll Burger, MD, 1 drop at 07/20/21 2143   letrozole [facility]) tablet 2.5 mg, 2.5 mg, Oral, Daily, Shalhoub, Sherryll Burger, MD   levothyroxine (SYNTHROID) tablet 112 mcg, 112 mcg, Oral, Q0600, Vernelle Emerald, MD, 112 mcg at 07/16/21 0547   LORazepam (ATIVAN) injection 1 mg, 1 mg, Intravenous, Once, Adhikari, Amrit, MD   metoprolol succinate (TOPROL-XL) 24 hr tablet 50 mg, 50 mg, Oral, Daily, Shalhoub, Sherryll Burger, MD   ondansetron (ZOFRAN) tablet 4 mg, 4 mg, Oral, Q6H PRN **OR** ondansetron (ZOFRAN) injection 4 mg, 4 mg, Intravenous, Q6H PRN, Shalhoub, Sherryll Burger, MD, 4 mg at 07/17/21 2025   polyethylene glycol (MIRALAX / GLYCOLAX) packet 17 g, 17 g, Oral, Daily PRN,  Shalhoub, Sherryll Burger, MD   promethazine (PHENERGAN) 6.25 mg in sodium chloride 0.9 % 50 mL IVPB, 6.25 mg, Intravenous, Q8H PRN, [place] Crane, Carole N, DO   thiamine 500mg  in normal saline (32ml) IVPB, 500 mg, Intravenous, Q8H, Last Rate: 100 mL/hr at 07/21/21 0521, 500 mg at 07/21/21 0521 **FOLLOWED BY** [START ON 07/22/2021] thiamine (B-1) 250 mg in sodium chloride 0.9 % 50 mL IVPB, 250 mg, Intravenous, Daily **FOLLOWED BY** [START ON 07/28/2021] thiamine (B-1) injection 100 mg, 100 mg, Intravenous, Daily, Kirby-Graham, Karsten Fells, NP   valproate  (DEPACON) 500 mg in dextrose 5 % 50 mL IVPB, 500 mg, Intravenous, Q12H, Adhikari, Amrit, MD, Last Rate: 55 mL/hr at 07/20/21 2226, 500 mg at 07/20/21 2226  Pertinent Labs Na 133. Creat 4.87. K normal.   Imaging  07/20/21 CT Head-similar to 07/16/21 without acute change.  No acute intracranial abnormality. Old right MCA territory infarct and multiple old cerebellar infarcts.Severe chronic small vessel disease and generalized atrophy.  cEEG: This study is suggestive of cortical dysfunction in right centro-parietal region consistent with prior craniotomy and underlying stroke.there is also moderate degree of encephalopathy, nonspecific etiology.  No seizures or definite epileptiform discharges were seen throughout the recording.   Assessment:  75 yo female who is not combative today and is awake. She seems to be doing well on the [place]. Yesterday, given her continued altered mental status, we did a r/p CTH to look for a reversible cause, but CTH similar to before without structural changes. Yesterday, her WBCC was high, but CXR did not show evidence of infection. UA is not done and NP is assuming that she does not void on her own (ESRD). This is likely multifactorial due to delirium, seizure, uremia, recent stroke. MRI brain would be optimal, but doubt she will be able to tolerate and doubt it would yield anything useful at this point. cEEG read without seizures or definite epileptiform discharges but evidence of cortical dysfunction and encephalopathy. Given her speech changes, we were checking for seizure activity. High dose Thiamine was started yesterday.   Impression: -Multifactorial toxic/metabolic encephalopathy. -Delirium.  -History of seizure this hospitalization.  -Query underlying dementia.    Recommendations/Plan:  -cEEG negative for seizures.  -Continue [facility].  -Continue B12 and Thiamine.  -Continue seizure precautions.  -Ativan 2mg  IV prn seizure lasting over 5 mins and call Neurology.   -Delirum precautions.  -Avoid medications on the [place]'s list for the elderly.  -Will need out patient neurology f/up when discharged-referral made.  -Neurology will follow along until improvement is noticed in mental status.   Pt seen by Clance Boll, MSN, APN-BC/Nurse Practitioner/Neuro and later by MD. Note and plan to be edited as needed by MD.  Pager: [phone]   I have seen the patient, she is actually slightly more interactive with me today, answering a few simple questions.  I still think that multifactorial delirium is very possible, and with negative repeat CT head negative EEG, think that this is likely.  Hopefully she will continue to improve gradually over the next few days.  If not, we need may need to more aggressively pursue MRI.  Roland Rack, MD [facility] [phone]  If 7pm- 7am, please page neurology on call as listed in [place].

## 2021-07-22 ENCOUNTER — Inpatient Hospital Stay (HOSPITAL_COMMUNITY): Payer: Medicare (Managed Care)

## 2021-07-22 DIAGNOSIS — Z7189 Other specified counseling: Secondary | ICD-10-CM

## 2021-07-22 DIAGNOSIS — F0151 Vascular dementia with behavioral disturbance: Secondary | ICD-10-CM

## 2021-07-22 DIAGNOSIS — Z515 Encounter for palliative care: Secondary | ICD-10-CM

## 2021-07-22 LAB — GLUCOSE, CAPILLARY
Glucose-Capillary: 107 mg/dL — ABNORMAL HIGH (ref 70–99)
Glucose-Capillary: 128 mg/dL — ABNORMAL HIGH (ref 70–99)
Glucose-Capillary: 168 mg/dL — ABNORMAL HIGH (ref 70–99)
Glucose-Capillary: 189 mg/dL — ABNORMAL HIGH (ref 70–99)
Glucose-Capillary: 53 mg/dL — ABNORMAL LOW (ref 70–99)
Glucose-Capillary: 56 mg/dL — ABNORMAL LOW (ref 70–99)
Glucose-Capillary: 60 mg/dL — ABNORMAL LOW (ref 70–99)
Glucose-Capillary: 72 mg/dL (ref 70–99)
Glucose-Capillary: 83 mg/dL (ref 70–99)
Glucose-Capillary: 87 mg/dL (ref 70–99)

## 2021-07-22 LAB — CBC
HCT: 34.7 % — ABNORMAL LOW (ref 36.0–46.0)
Hemoglobin: 10.4 g/dL — ABNORMAL LOW (ref 12.0–15.0)
MCH: 28.4 pg (ref 26.0–34.0)
MCHC: 30 g/dL (ref 30.0–36.0)
MCV: 94.8 fL (ref 80.0–100.0)
Platelets: 190 10*3/uL (ref 150–400)
RBC: 3.66 MIL/uL — ABNORMAL LOW (ref 3.87–5.11)
RDW: 15.6 % — ABNORMAL HIGH (ref 11.5–15.5)
WBC: 5.6 10*3/uL (ref 4.0–10.5)
nRBC: 0 % (ref 0.0–0.2)

## 2021-07-22 LAB — RENAL FUNCTION PANEL
Albumin: 2.5 g/dL — ABNORMAL LOW (ref 3.5–5.0)
Anion gap: 10 (ref 5–15)
BUN: 15 mg/dL (ref 8–23)
CO2: 25 mmol/L (ref 22–32)
Calcium: 8.5 mg/dL — ABNORMAL LOW (ref 8.9–10.3)
Chloride: 100 mmol/L (ref 98–111)
Creatinine, Ser: 4.77 mg/dL — ABNORMAL HIGH (ref 0.44–1.00)
GFR, Estimated: 9 mL/min — ABNORMAL LOW (ref >60)
Glucose, Bld: 69 mg/dL — ABNORMAL LOW (ref 70–99)
Phosphorus: 4.3 mg/dL (ref 2.5–4.6)
Potassium: 4.1 mmol/L (ref 3.5–5.1)
Sodium: 135 mmol/L (ref 135–145)

## 2021-07-22 IMAGING — MR MR HEAD W/O CM
9 of 10 series · 42 of 48 positions shown · non-contrast
Comparison: Head CT from 2 days ago

CLINICAL DATA: Altered mental status.  Delirium.

EXAM:
MRI HEAD WITHOUT CONTRAST
TECHNIQUE: Multiplanar, multiecho pulse sequences of the brain and surrounding
structures were obtained without intravenous contrast.

[Series 8: T2 · axial · 5.0mm · 0.72mm/px · z∈[-101,+42]mm · 2 of 25 slices shown (1 of 2)]
[im 1/25]
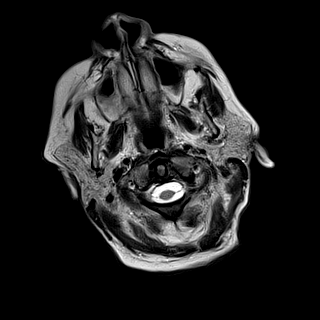
[im 25/25]
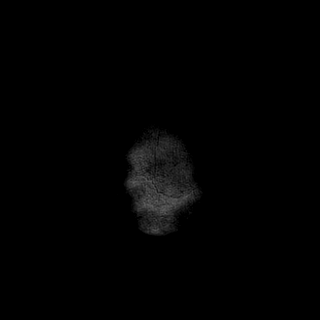

[Series 9: ax hemo · axial · 5.0mm · 0.86mm/px · z∈[-99,+44]mm · 3 of 25 slices shown]
[im 1/25]
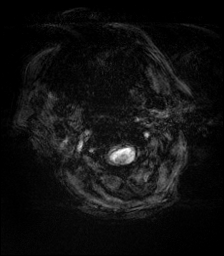
[im 13/25]
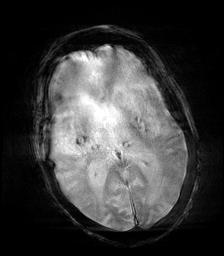
[im 25/25]
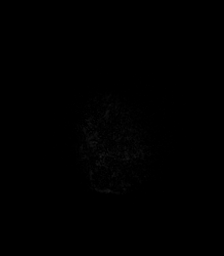

[Series 10: FLAIR · axial · 5.0mm · 0.90mm/px · z∈[-101,+42]mm · 3 of 25 slices shown]
[im 1/25]
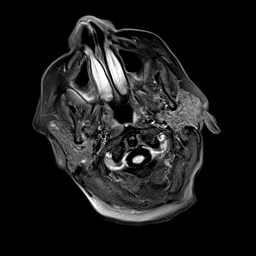
[im 13/25]
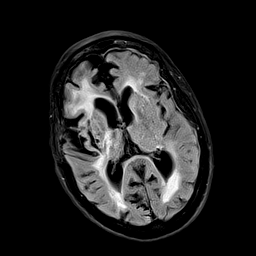
[im 25/25]
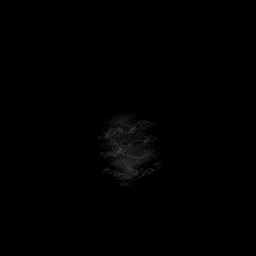

[Series 11: DWI · axial · 3.0mm · 0.88mm/px · z∈[-107,+33]mm · 11 of 96 slices shown (1 of 4)]
[im 1/96]
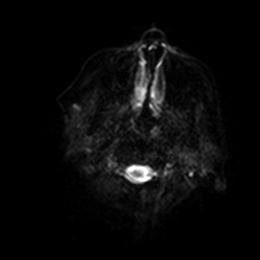
[im 10/96]
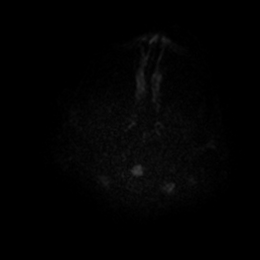
[im 20/96]
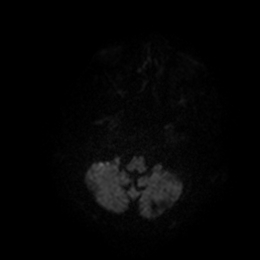
[im 29/96]
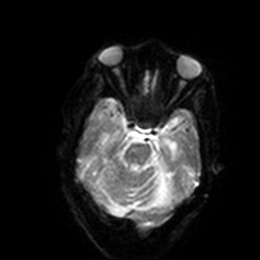
[im 39/96]
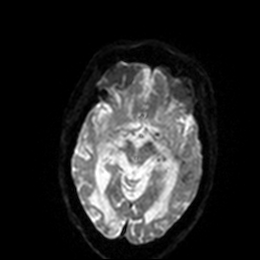
[im 48/96]
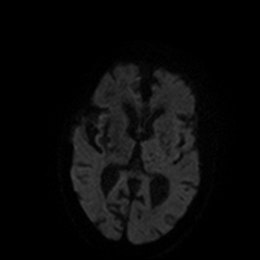
[im 58/96]
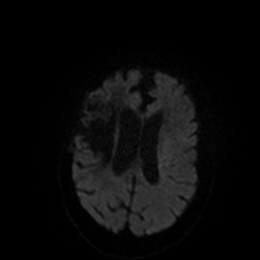
[im 67/96]
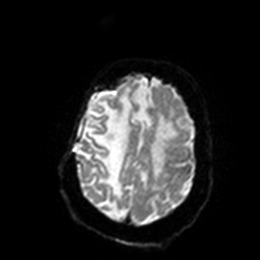
[im 77/96]
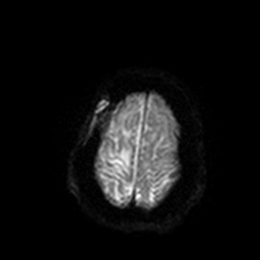
[im 86/96]
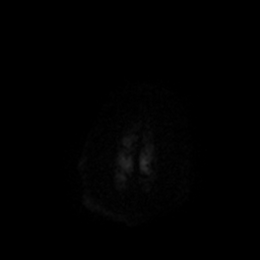
[im 96/96]
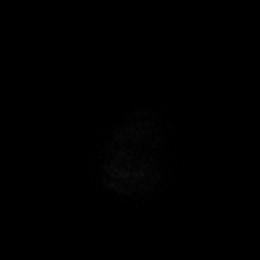

[Series 12: DWI · axial · 3.0mm · 0.88mm/px · z∈[-107,+33]mm · 5 of 48 slices shown (2 of 4)]
[im 1/48]
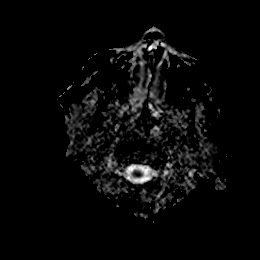
[im 12/48]
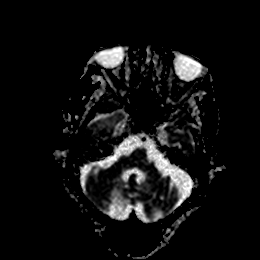
[im 24/48]
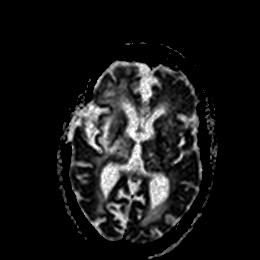
[im 36/48]
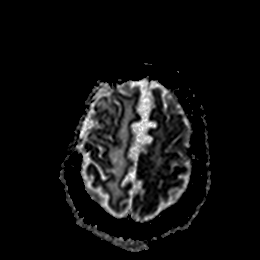
[im 48/48]
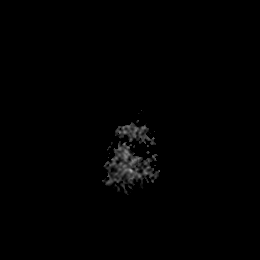

[Series 13: DWI · coronal · 4.0mm · 0.88mm/px · 8 of 68 slices shown (3 of 4)]
[im 1/68]
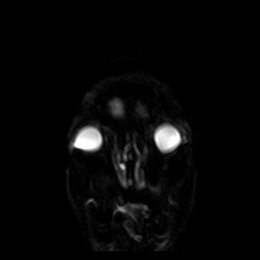
[im 10/68]
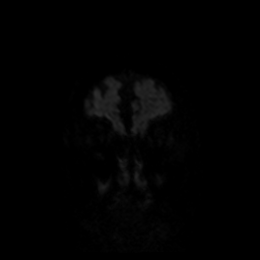
[im 20/68]
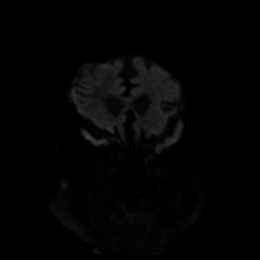
[im 29/68]
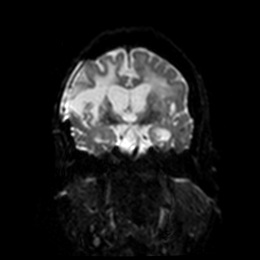
[im 39/68]
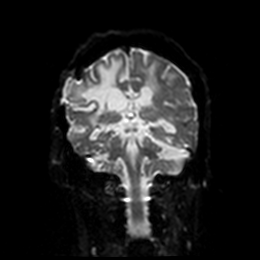
[im 48/68]
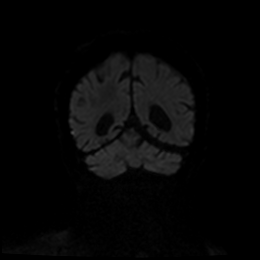
[im 58/68]
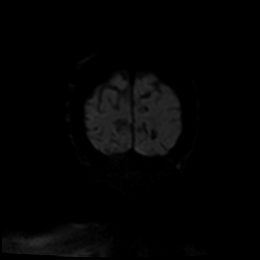
[im 68/68]
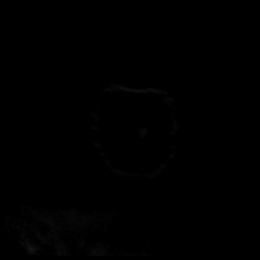

[Series 14: DWI · coronal · 4.0mm · 0.88mm/px · 4 of 34 slices shown (4 of 4)]
[im 1/34]
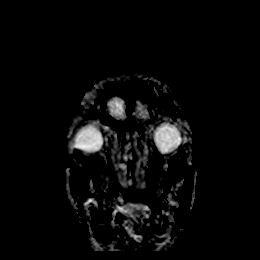
[im 12/34]
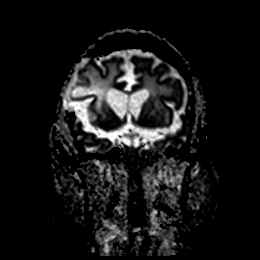
[im 23/34]
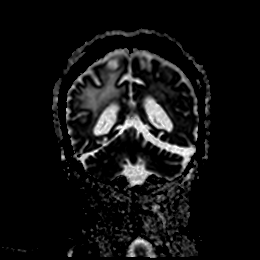
[im 34/34]
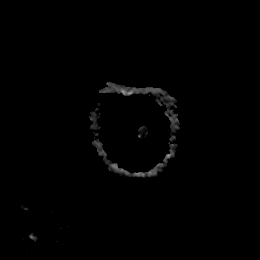

[Series 15: T1 · sagittal · 5.0mm · 0.75mm/px · 3 of 23 slices shown]
[im 1/23]
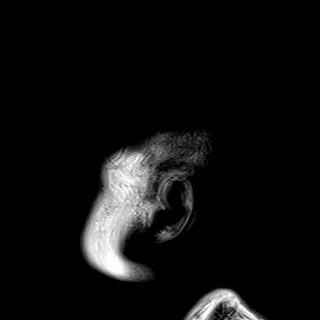
[im 12/23]
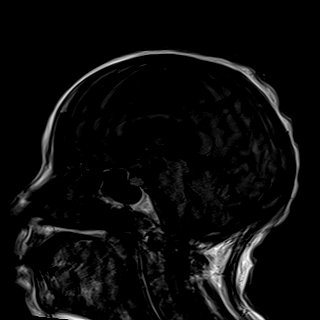
[im 23/23]
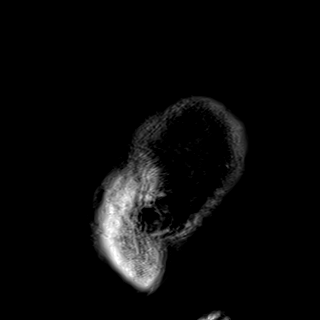

[Series 17: T2 · coronal · 5.0mm · 0.34mm/px · 3 of 29 slices shown (2 of 2)]
[im 1/29]
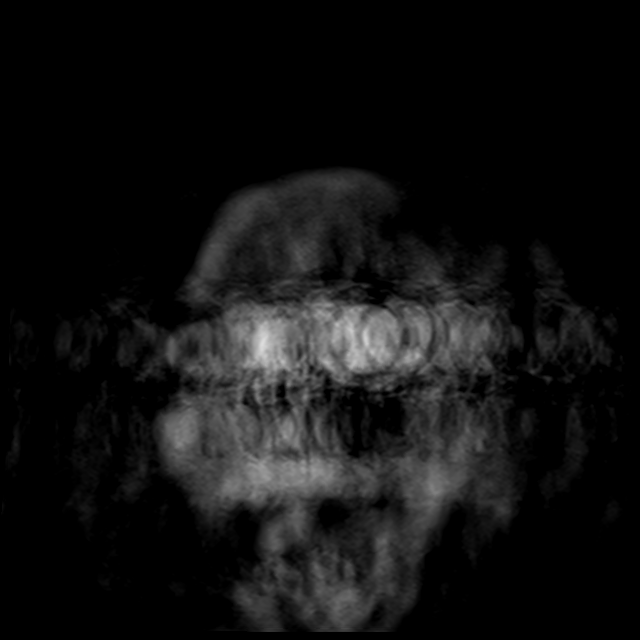
[im 15/29]
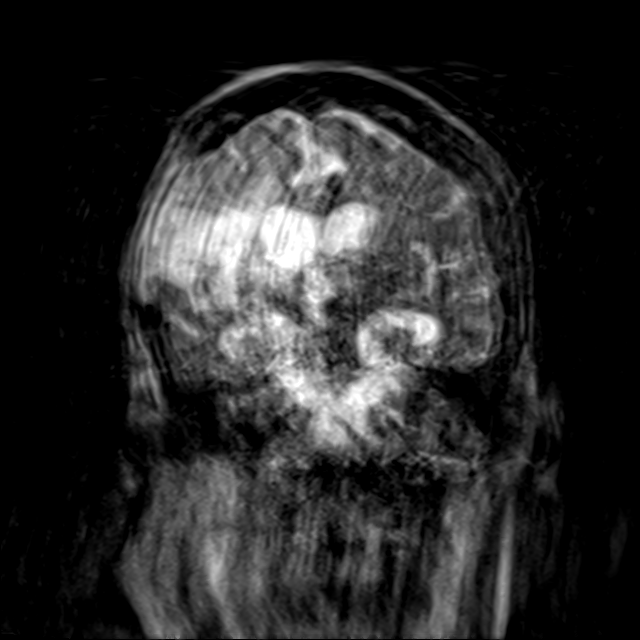
[im 29/29]
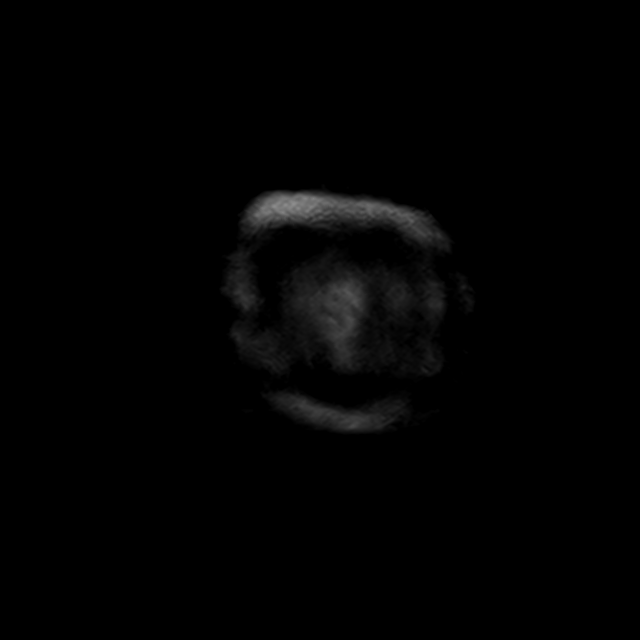

[42 of 48 positions shown; findings below may reference images not displayed]

FINDINGS: Brain: No acute infarction, hemorrhage, hydrocephalus, extra-axial
collection or mass lesion.

Sizable remote right MCA territory infarct centered on the insula
and lateral frontal lobe with wallerian atrophy seen into the
brainstem.

Chronic small vessel ischemia with white matter gliosis and chronic
lacunar infarcts at the deep gray nuclei and brainstem. Numerous
small remote bilateral cerebellar infarcts.

Generalized brain atrophy.

Vascular: Preserved flow voids

Skull and upper cervical spine: Right-sided craniotomy overlying the
patient's prior infarct.

Sinuses/Orbits: Left cataract resection
IMPRESSION: 1. No acute or reversible finding.
2. Brain atrophy and advanced chronic ischemic injury.

## 2021-07-22 MED ORDER — DEXTROSE 50 % IV SOLN
25.0000 g | Freq: Once | INTRAVENOUS | Status: AC
  Administered 2021-07-22: 25 g via INTRAVENOUS
  Filled 2021-07-22: qty 50

## 2021-07-22 MED ORDER — DEXTROSE 50 % IV SOLN
INTRAVENOUS | Status: AC
  Administered 2021-07-22: 50 mL
  Filled 2021-07-22: qty 50

## 2021-07-22 MED ORDER — HEPARIN SODIUM (PORCINE) 1000 UNIT/ML IJ SOLN
INTRAMUSCULAR | Status: AC
  Filled 2021-07-22: qty 4

## 2021-07-22 MED ORDER — DEXTROSE 50 % IV SOLN
12.5000 g | INTRAVENOUS | Status: AC
  Administered 2021-07-22: 12.5 g via INTRAVENOUS

## 2021-07-22 MED ORDER — SODIUM CHLORIDE 0.9 % IV SOLN
100.0000 mg | Freq: Two times a day (BID) | INTRAVENOUS | Status: DC
  Administered 2021-07-22 – 2021-07-28 (×13): 100 mg via INTRAVENOUS
  Filled 2021-07-22 (×15): qty 10

## 2021-07-22 MED ORDER — GLUCAGON HCL RDNA (DIAGNOSTIC) 1 MG IJ SOLR
INTRAMUSCULAR | Status: AC
  Administered 2021-07-22: 1 mg
  Filled 2021-07-22: qty 1

## 2021-07-22 MED ORDER — DEXTROSE 10 % IV SOLN: INTRAVENOUS | Status: AC

## 2021-07-22 MED ORDER — DEXTROSE 50 % IV SOLN
INTRAVENOUS | Status: AC
  Filled 2021-07-22: qty 50

## 2021-07-22 NOTE — Progress Notes (Addendum)
PROGRESS NOTE    Kirsten Mcmahon  IPJ:[id number] DOB: 08/14/1946 DOA: 07/15/2021 PCP: [facility]   Chief Complain:Seizure  Brief Narrative: Patient is a 75 year old female with history of ESRD on dialysis on Monday Wednesday Friday, history of breast cancer, history of stroke, diabetes type 2, seizure disorder who was brought to the emergency department after she had seizure-like activity during hemodialysis.  As per the report, patient ran out of her [place] on 8/9.  Patient had 30 minutes left in her hemodialysis session, she started to have seizure-like activity.  Hemodialysis was stopped and she was brought to the emergency department.  She was postictal on presentation, lethargic, disoriented.  CT head did not show any acute intracranial normalities.  Neurology was consulted who recommended to continue Keppra 500 mg daily and 250 mg postdialysis initially but changed the Keppra to [place] . Patient is still encephalopathic.  Nephrology also following for dialysis.  Assessment & Plan:   Principal Problem:   Breakthrough seizure ([place]) Active Problems:   Essential hypertension   Hypothyroidism   Type 2 diabetes mellitus with ESRD (end-stage renal disease) (HCC)   End-stage renal disease on hemodialysis ([place])   Mixed diabetic hyperlipidemia associated with type 2 diabetes mellitus ([place])   Seizure ([place])   Altered mental status/agitation: Hospital course remarkable for persistent confusion, agitation.  Usually alert and awake  at baseline but her mental status fluctuates.   Son thinks that she is intolerant to [place] and has been skipping this medicine at home.  CT head on presentation did not show any acute intracranial abnormalities.  Reconsulted neurology.  Keppra stopped and started on valproate.Continue delirium precaution. Still confused and sleepy today.  She did not wake up on calling her name.  We need to avoid sedatives, narcotics as much as possible. Unclear  etiology for persistent encephalopathy.  Neurology recommended infectious work-up: Including UA, chest x-ray. CXR did not show PNA .She had mild leukocytosis, afebrile. MRI of the brain did not show any acute intracranial normalities but showed generalized atrophy, chronic small vessel ischemic changes. cEEG showed cortical dysfunction in the right central parietal region no seizures.  Seizure: Had seizure activity while at dialysis.  She recently ran out of [place] on 8/9.  EEG showed cortical dysfunction in the right frontal region, moderate diffuse encephalopathy, no seizures or epileptiform discharges.  Currently on valproate  Hypoglycemia: Unknown etiology. Not on any meds at home. Hemoglobin A1c of 5.6 as per 6/22. She is on gentle D10 because of poor oral intake.    ESRD on dialysis: Has tunneled dialysis catheter on the right chest.  Nephrology consulted and following for dialysis.  History of hypertension: Continue current medication.  Monitor blood pressure  Hypothyroidism: Continue levothyroxine.TSH on 8/10 is normal  H/O stroke: She is weak on left side,walks with cane. As per son,she is usually alert and awake ,usually oriented but sometimes confused  Goals of care: Elderly patient with multiple comorbidities, on dialysis, encephalopathic, poor oral intake.  Still a full code.  I have consulted palliative care for goals of care discussion        DVT prophylaxis:Heparin [place] Code Status: Full Family Communication: Discussed with son on phone on 07/22/21 on phone    Dispo: The patient is from: Home              Anticipated d/c is to: Home              Patient currently is not medically stable to  d/c.   Difficult to place patient No     Consultants: neurology  Procedures:None  Antimicrobials:  Anti-infectives (From admission, onward)    None       Subjective:  Patient seen and examined the bedside this morning.  Hemodynamically stable.  She was seen at the  dialysis suite.  She was still encephalopathic.  Barely opened her eyes on calling her name and rubbing her chest.  Blood sugars were low this morning and was given dextrose.  Objective: Vitals:   07/21/21 2108 07/22/21 0005 07/22/21 0558 07/22/21 0752  BP: (!) 157/92 109/71 99/72 112/86  Pulse: 99 94 90 93  Resp: 18 19 16 20   Temp: 97.8 F (36.6 C) 98 F (36.7 C) 98.1 F (36.7 C) 97.6 F (36.4 C)  TempSrc:   Oral Oral  SpO2: 100% 98% 95% 97%  Weight:       No intake or output data in the 24 hours ending 07/22/21 0801   Filed Weights   07/18/21 0600 07/20/21 0920 07/20/21 1230  Weight: 55 kg 53.5 kg 53 kg    Examination:  General exam: Eyes closed, encephalopathic Respiratory system:  no wheezes or crackles  Cardiovascular system: S1 & S2 heard, RRR.  Tunneled dialysis catheter on the right chest Gastrointestinal system: Abdomen is nondistended, soft and nontender. Central nervous system: Not alert or awake Extremities: No edema, no clubbing ,no cyanosis Skin: No rashes, no ulcers,no icterus         Data Reviewed: I have personally reviewed following labs and imaging studies  CBC: Recent Labs  Lab 07/15/21 1747 07/16/21 0530 07/20/21 0934  WBC 8.6 6.6 11.7*  NEUTROABS 7.0 3.8  --   HGB 9.8* 9.4* 11.3*  HCT 32.1* 31.4* 35.9*  MCV 95.8 97.2 93.5  PLT 156 173 361   Basic Metabolic Panel: Recent Labs  Lab 07/15/21 1747 07/16/21 0530 07/18/21 0435 07/20/21 0934  NA 137 138  --  133*  K 3.5 3.8  --  3.9  CL 102 103  --  97*  CO2 26 27  --  27  GLUCOSE 109* 85  --  94  BUN 10 13  --  18  CREATININE 2.61* 3.52*  --  4.87*  CALCIUM 8.4* 8.5*  --  8.4*  MG  --  2.0  --   --   PHOS  --   --  3.8 3.9   GFR: Estimated Creatinine Clearance: 7.7 mL/min (A) (by C-G formula based on SCr of 4.87 mg/dL (H)). Liver Function Tests: Recent Labs  Lab 07/16/21 0530 07/20/21 0934  AST 23  --   ALT 22  --   ALKPHOS 60  --   BILITOT 0.5  --   PROT 6.1*  --    ALBUMIN 2.6* 2.6*   No results for input(s): LIPASE, AMYLASE in the last 168 hours. Recent Labs  Lab 07/21/21 1221  AMMONIA 21   Coagulation Profile: No results for input(s): INR, PROTIME in the last 168 hours. Cardiac Enzymes: No results for input(s): CKTOTAL, CKMB, CKMBINDEX, TROPONINI in the last 168 hours. BNP (last 3 results) No results for input(s): PROBNP in the last 8760 hours. HbA1C: No results for input(s): HGBA1C in the last 72 hours. CBG: Recent Labs  Lab 07/21/21 2224 07/22/21 0015 07/22/21 0139 07/22/21 0213 07/22/21 0557  GLUCAP 108* 72 60* 83 87   Lipid Profile: No results for input(s): CHOL, HDL, LDLCALC, TRIG, CHOLHDL, LDLDIRECT in the last 72 hours. Thyroid Function Tests: No  results for input(s): TSH, T4TOTAL, FREET4, T3FREE, THYROIDAB in the last 72 hours.  Anemia Panel: Recent Labs    07/21/21 0810  VITAMINB12 821   Sepsis Labs: No results for input(s): PROCALCITON, LATICACIDVEN in the last 168 hours.  Recent Results (from the past 240 hour(s))  Resp Panel by RT-PCR (Flu A&B, Covid) Nasopharyngeal Swab     Status: None   Collection Time: 07/15/21  5:50 PM   Specimen: Nasopharyngeal Swab; Nasopharyngeal(NP) swabs in vial transport medium  Result Value Ref Range Status   SARS Coronavirus 2 by RT PCR NEGATIVE NEGATIVE Final    Comment: (NOTE) SARS-CoV-2 target nucleic acids are NOT DETECTED.  The SARS-CoV-2 RNA is generally detectable in upper respiratory specimens during the acute phase of infection. The lowest concentration of SARS-CoV-2 viral copies this assay can detect is 138 copies/mL. A negative result does not preclude SARS-Cov-2 infection and should not be used as the sole basis for treatment or other patient management decisions. A negative result may occur with  improper specimen collection/handling, submission of specimen other than nasopharyngeal swab, presence of viral mutation(s) within the areas targeted by this assay, and  inadequate number of viral copies(<138 copies/mL). A negative result must be combined with clinical observations, patient history, and epidemiological information. The expected result is Negative.  Fact Sheet for Patients:  EntrepreneurPulse.com.au  Fact Sheet for Healthcare Providers:  IncredibleEmployment.be  This test is no t yet approved or cleared by the [place] FDA and  has been authorized for detection and/or diagnosis of SARS-CoV-2 by FDA under an Emergency Use Authorization (EUA). This EUA will remain  in effect (meaning this test can be used) for the duration of the COVID-19 declaration under Section 564(b)(1) of the Act, 21 U.S.C.section 360bbb-3(b)(1), unless the authorization is terminated  or revoked sooner.       Influenza A by PCR NEGATIVE NEGATIVE Final   Influenza B by PCR NEGATIVE NEGATIVE Final    Comment: (NOTE) The Xpert Xpress SARS-CoV-2/FLU/RSV plus assay is intended as an aid in the diagnosis of influenza from Nasopharyngeal swab specimens and should not be used as a sole basis for treatment. Nasal washings and aspirates are unacceptable for Xpert Xpress SARS-CoV-2/FLU/RSV testing.  Fact Sheet for Patients: EntrepreneurPulse.com.au  Fact Sheet for Healthcare Providers: IncredibleEmployment.be  This test is not yet approved or cleared by the [place] FDA and has been authorized for detection and/or diagnosis of SARS-CoV-2 by FDA under an Emergency Use Authorization (EUA). This EUA will remain in effect (meaning this test can be used) for the duration of the COVID-19 declaration under Section 564(b)(1) of the Act, 21 U.S.C. section 360bbb-3(b)(1), unless the authorization is terminated or revoked.  Performed at [facility], [place] [address], [place], [place] [address]          Radiology Studies: CT HEAD WO CONTRAST (5MM)  Result Date: 07/20/2021 CLINICAL  DATA:  Seizure EXAM: CT HEAD WITHOUT CONTRAST TECHNIQUE: Contiguous axial images were obtained from the base of the skull through the vertex without intravenous contrast. COMPARISON:  07/16/2021 FINDINGS: Brain: Old right MCA territory infarct on background of severe chronic small vessel disease. There are multiple old cerebellar infarcts. Diffuse generalized atrophy. No acute hemorrhage. Vascular: No abnormal hyperdensity of the major intracranial arteries or dural venous sinuses. No intracranial atherosclerosis. Skull: The visualized skull base, calvarium and extracranial soft tissues are normal. Sinuses/Orbits: No fluid levels or advanced mucosal thickening of the visualized paranasal sinuses. No mastoid or middle ear effusion. The  orbits are normal. IMPRESSION: 1. No acute intracranial abnormality. 2. Old right MCA territory infarct and multiple old cerebellar infarcts. 3. Severe chronic small vessel disease and generalized atrophy. Electronically Signed   By: Ulyses Jarred M.D.   On: 07/20/2021 19:25   MR BRAIN WO CONTRAST  Result Date: 07/22/2021 CLINICAL DATA:  Altered mental status.  Delirium. EXAM: MRI HEAD WITHOUT CONTRAST TECHNIQUE: Multiplanar, multiecho pulse sequences of the brain and surrounding structures were obtained without intravenous contrast. COMPARISON:  Head CT from 2 days ago FINDINGS: Brain: No acute infarction, hemorrhage, hydrocephalus, extra-axial collection or mass lesion. Sizable remote right MCA territory infarct centered on the insula and lateral frontal lobe with wallerian atrophy seen into the brainstem. Chronic small vessel ischemia with white matter gliosis and chronic lacunar infarcts at the deep gray nuclei and brainstem. Numerous small remote bilateral cerebellar infarcts. Generalized brain atrophy. Vascular: Preserved flow voids Skull and upper cervical spine: Right-sided craniotomy overlying the patient's prior infarct. Sinuses/Orbits: Left cataract resection IMPRESSION:  1. No acute or reversible finding. 2. Brain atrophy and advanced chronic ischemic injury. Electronically Signed   By: Monte Fantasia M.D.   On: 07/22/2021 05:17   DG CHEST PORT 1 VIEW  Result Date: 07/20/2021 CLINICAL DATA:  Confusion EXAM: PORTABLE CHEST 1 VIEW COMPARISON:  05/17/2021 FINDINGS: Right neck catheter tip overlies the right atrium. Unchanged, enlarged cardiac silhouette. No focal airspace disease. No large pleural effusion or visible pneumothorax. Bilateral shoulder degenerative changes. No acute osseous abnormality. IMPRESSION: No evidence of acute cardiopulmonary disease. Electronically Signed   By: Maurine Simmering M.D.   On: 07/20/2021 16:39   EEG adult  Result Date: 07/21/2021 Lora Havens, MD     07/21/2021  8:27 AM Patient Name: Kirsten Mcmahon MRN: [id number] Epilepsy Attending: Lora Havens Referring Physician/Provider: Clance Boll, NP Date: 07/21/2021 Duration: 21.42 mins Patient history: 75 yo female who presented with breakthrough seizure provoked by missing Keppra dose.  EEG to evaluate for seizures. Level of alertness: Awake AEDs during EEG study: VPA Technical aspects: This EEG study was done with scalp electrodes positioned according to the 10-20 International system of electrode placement. Electrical activity was acquired at a sampling rate of 500Hz  and reviewed with a high frequency filter of 70Hz  and a low frequency filter of 1Hz . EEG data were recorded continuously and digitally stored. Description: No posterior dominant rhythm was seen.  EEG showed continuous generalized and lateralized right hemisphere 3 to 6 Hz theta-delta slowing. There are also sharply contoured waves in the right centro-parietal region consistent with underlying breach artifact.  Hyperventilation and photic stimulation were not performed.   ABNORMALITY -Breach artifact, right centro-parietal region -Continuous slow, generalized and lateralized right hemisphere IMPRESSION: This study is  suggestive of cortical dysfunction in right centro-parietal region consistent with prior craniotomy and underlying stroke.there is also moderate degree of encephalopathy, nonspecific etiology.  No seizures or definite epileptiform discharges were seen throughout the recording. [name]        Scheduled Meds:  (feeding supplement) PROSource Plus  30 mL Oral TID BM   aspirin  81 mg Oral Daily   calcitRIOL  0.5 mcg Oral Q M,W,F-HD   Chlorhexidine Gluconate Cloth  6 each Topical Q0600   heparin  5,000 Units Subcutaneous Q8H   latanoprost  1 drop Both Eyes QHS   letrozole  2.5 mg Oral Daily   levothyroxine  112 mcg Oral Q0600   metoprolol succinate  50 mg Oral Daily   [START ON  07/28/2021] thiamine injection  100 mg Intravenous Daily   Continuous Infusions:  dextrose 30 mL/hr at 07/22/21 0321   promethazine (PHENERGAN) injection (IM or IVPB)     thiamine injection     valproate sodium 500 mg (07/22/21 0018)     LOS: 5 days    Time spent: 25 mins.More than 50% of that time was spent in counseling and/or coordination of care.      Shelly Coss, MD [facility] P8/17/2022, 8:01 AM

## 2021-07-22 NOTE — Progress Notes (Signed)
[facility] Progress Note   Subjective:     Having hypoglycemia d/t poor oral intake.  Confused/ somnolent.    Objective Vitals:   07/22/21 1000 07/22/21 1030 07/22/21 1100 07/22/21 1130  BP: 108/67 115/83 111/79 107/77  Pulse: 97 (!) 102 72 (!) 107  Resp: 20     Temp:      TempSrc:      SpO2:      Weight:       Physical Exam General: frail-appearing, somnolent Lungs: clear bilaterally Heart: RRR. No murmur, rubs or gallops.  Abdomen: soft, nontender, +BS Lower extremities:no edema  Neuro: somnolent Dialysis Access: [facility]  Filed Weights   07/20/21 0920 07/20/21 1230 07/22/21 0840  Weight: 53.5 kg 53 kg 53.5 kg   No intake or output data in the 24 hours ending 07/22/21 1252   Additional Objective Labs: Basic Metabolic Panel: Recent Labs  Lab 07/16/21 0530 07/18/21 0435 07/20/21 0934 07/22/21 0934  NA 138  --  133* 135  K 3.8  --  3.9 4.1  CL 103  --  97* 100  CO2 27  --  27 25  GLUCOSE 85  --  94 69*  BUN 13  --  18 15  CREATININE 3.52*  --  4.87* 4.77*  CALCIUM 8.5*  --  8.4* 8.5*  PHOS  --  3.8 3.9 4.3   Liver Function Tests: Recent Labs  Lab 07/16/21 0530 07/20/21 0934 07/22/21 0934  AST 23  --   --   ALT 22  --   --   ALKPHOS 60  --   --   BILITOT 0.5  --   --   PROT 6.1*  --   --   ALBUMIN 2.6* 2.6* 2.5*   No results for input(s): LIPASE, AMYLASE in the last 168 hours. CBC: Recent Labs  Lab 07/15/21 1747 07/16/21 0530 07/20/21 0934 07/22/21 0934  WBC 8.6 6.6 11.7* 5.6  NEUTROABS 7.0 3.8  --   --   HGB 9.8* 9.4* 11.3* 10.4*  HCT 32.1* 31.4* 35.9* 34.7*  MCV 95.8 97.2 93.5 94.8  PLT 156 173 241 190   Blood Culture    Component Value Date/Time   SDES BLOOD LEFT FOREARM 05/17/2021 0953   SPECREQUEST  05/17/2021 0953    BOTTLES DRAWN AEROBIC AND ANAEROBIC Blood Culture adequate volume   CULT  05/17/2021 0953    NO GROWTH 5 DAYS Performed at [facility] [address] [place], [place] [address]     REPTSTATUS 05/22/2021 FINAL 05/17/2021 0953    Cardiac Enzymes: No results for input(s): CKTOTAL, CKMB, CKMBINDEX, TROPONINI in the last 168 hours. CBG: Recent Labs  Lab 07/22/21 0139 07/22/21 0213 07/22/21 0557 07/22/21 0823 07/22/21 0942  GLUCAP 60* 83 87 53* 128*   Iron Studies: No results for input(s): IRON, TIBC, TRANSFERRIN, FERRITIN in the last 72 hours. Lab Results  Component Value Date   INR 1.0 04/30/2021   Studies/Results: CT HEAD WO CONTRAST (5MM)  Result Date: 07/20/2021 CLINICAL DATA:  Seizure EXAM: CT HEAD WITHOUT CONTRAST TECHNIQUE: Contiguous axial images were obtained from the base of the skull through the vertex without intravenous contrast. COMPARISON:  07/16/2021 FINDINGS: Brain: Old right MCA territory infarct on background of severe chronic small vessel disease. There are multiple old cerebellar infarcts. Diffuse generalized atrophy. No acute hemorrhage. Vascular: No abnormal hyperdensity of the major intracranial arteries or dural venous sinuses. No intracranial atherosclerosis. Skull: The visualized skull base, calvarium and extracranial soft  tissues are normal. Sinuses/Orbits: No fluid levels or advanced mucosal thickening of the visualized paranasal sinuses. No mastoid or middle ear effusion. The orbits are normal. IMPRESSION: 1. No acute intracranial abnormality. 2. Old right MCA territory infarct and multiple old cerebellar infarcts. 3. Severe chronic small vessel disease and generalized atrophy. Electronically Signed   By: Ulyses Jarred M.D.   On: 07/20/2021 19:25   MR BRAIN WO CONTRAST  Result Date: 07/22/2021 CLINICAL DATA:  Altered mental status.  Delirium. EXAM: MRI HEAD WITHOUT CONTRAST TECHNIQUE: Multiplanar, multiecho pulse sequences of the brain and surrounding structures were obtained without intravenous contrast. COMPARISON:  Head CT from 2 days ago FINDINGS: Brain: No acute infarction, hemorrhage, hydrocephalus, extra-axial collection or mass  lesion. Sizable remote right MCA territory infarct centered on the insula and lateral frontal lobe with wallerian atrophy seen into the brainstem. Chronic small vessel ischemia with white matter gliosis and chronic lacunar infarcts at the deep gray nuclei and brainstem. Numerous small remote bilateral cerebellar infarcts. Generalized brain atrophy. Vascular: Preserved flow voids Skull and upper cervical spine: Right-sided craniotomy overlying the patient's prior infarct. Sinuses/Orbits: Left cataract resection IMPRESSION: 1. No acute or reversible finding. 2. Brain atrophy and advanced chronic ischemic injury. Electronically Signed   By: Monte Fantasia M.D.   On: 07/22/2021 05:17   DG CHEST PORT 1 VIEW  Result Date: 07/20/2021 CLINICAL DATA:  Confusion EXAM: PORTABLE CHEST 1 VIEW COMPARISON:  05/17/2021 FINDINGS: Right neck catheter tip overlies the right atrium. Unchanged, enlarged cardiac silhouette. No focal airspace disease. No large pleural effusion or visible pneumothorax. Bilateral shoulder degenerative changes. No acute osseous abnormality. IMPRESSION: No evidence of acute cardiopulmonary disease. Electronically Signed   By: Maurine Simmering M.D.   On: 07/20/2021 16:39   EEG adult  Result Date: 07/21/2021 Lora Havens, MD     07/22/2021  9:44 AM Patient Name: Kirsten Mcmahon MRN: [id number] Epilepsy Attending: Lora Havens Referring Physician/Provider: Clance Boll, NP Duration: 07/20/2021 2035 to 07/21/2021 1743 Patient history: 75 yo female who presented with breakthrough seizure provoked by missing Keppra dose.  EEG to evaluate for seizures. Level of alertness: Awake, asleep AEDs during EEG study: VPA Technical aspects: This EEG study was done with scalp electrodes positioned according to the 10-20 International system of electrode placement. Electrical activity was acquired at a sampling rate of 500Hz  and reviewed with a high frequency filter of 70Hz  and a low frequency filter of 1Hz .  EEG data were recorded continuously and digitally stored. Description: No posterior dominant rhythm was seen.  Sleep was characterized by vertex waves, sleep spindles (12-14 Hz, maximal frontocentral region. EEG showed continuous generalized and lateralized right hemisphere 3 to 6 Hz theta-delta slowing. There are also sharply contoured waves in the right centro-parietal region consistent with underlying breach artifact.  Hyperventilation and photic stimulation were not performed.   ABNORMALITY -Breach artifact, right centro-parietal region -Continuous slow, generalized and lateralized right hemisphere IMPRESSION: This study is suggestive of cortical dysfunction in right centro-parietal region consistent with prior craniotomy and underlying stroke.there is also moderate degree of encephalopathy, nonspecific etiology.  No seizures or definite epileptiform discharges were seen throughout the recording. [name]    Medications:  dextrose 30 mL/hr at 07/22/21 0321   promethazine (PHENERGAN) injection (IM or IVPB)     thiamine injection     valproate sodium Stopped (07/22/21 1133)    (feeding supplement) PROSource Plus  30 mL Oral TID BM   aspirin  81 mg Oral Daily  calcitRIOL  0.5 mcg Oral Q M,W,F-HD   Chlorhexidine Gluconate Cloth  6 each Topical Q0600   heparin  5,000 Units Subcutaneous Q8H   heparin sodium (porcine)       latanoprost  1 drop Both Eyes QHS   letrozole  2.5 mg Oral Daily   levothyroxine  112 mcg Oral Q0600   metoprolol succinate  50 mg Oral Daily   [START ON 07/28/2021] thiamine injection  100 mg Intravenous Daily    Dialysis Orders: HD Orders: [name] MWF 3:45 hr 180 NRE 400/[place] 1.5 56 kg 3.0 K/2.5 Ca TDC  -No Heparin -Mircera 150 mcg IV Q 2 weeks (last dose 07/15/2021) -Venofer 50 mg IV weekly (last dose 07/15/2021) -Calcitriol 0.5 mcg PO TIW  Assessment/Plan: Seizure-experienced a seizure during her last outpatient HD on 07/15/21.  Neurology following.   On depakote now Altered Mental Status-Currently sleeping. CT head (-) acute intracranial abnormalities x 2, last 07/20/21.  Neurology re-consulted. Continue Valproate. Noted previous episodes of combative/agitation-this can negatively impact whether she will be able to continue outpatient HD.  She is now somnolent and is getting HD in the bed.   ESRD - on HD MWF. Plan for HD 07/22/21 per usual schedule. UF as tolerated. Hypertension/volume  - Patient euvolemic on exam, Bps improving-continue HTN medication-monitor BP and HR. Anemia of CKD - Hgb 9.4-recently received ESA and Fe in outpatient-monitor trends Secondary Hyperparathyroidism -  Ca 8.5-continue PO calcitriol.  Nutrition - Continue carb-modified renal diet. Albumin 2.6-continue protein supplements. Dispo: overall poor prognosis--> ? If HD is contributing to [place] Pgr [phone] 07/22/2021,12:52 PM  LOS: 5 days

## 2021-07-22 NOTE — Progress Notes (Signed)
Patient refused all PO meds this shift

## 2021-07-22 NOTE — Progress Notes (Signed)
Pts CBG was 53 pt transferred to dialysis. Came to unit to administer dextrose.

## 2021-07-22 NOTE — Progress Notes (Signed)
At HS and 0000 check CBG was below 70, D50 was given per policy, MD notified, See new fluid orders. Patient went down for MRI and returned without issue. 0500 CBG 82.

## 2021-07-22 NOTE — Procedures (Signed)
Patient seen and examined on Hemodialysis. BP 107/77 (BP Location: Left Arm)   Pulse (!) 107   Temp 97.8 F (36.6 C) (Oral)   Resp 20   Wt 53.5 kg   SpO2 97%   BMI 21.57 kg/m   QB 400 mL/ min via TDC, UF goal 1L  Tolerating treatment without complaints at this time.  Had hypoglycemia- got dextrose- last CBG [place] 1:00 PM

## 2021-07-22 NOTE — Progress Notes (Signed)
Additional non-face to face encounter-   Received call from Kirsten Mcmahon who is a Education officer, museum with [facility].  Patient is under their services- she participates regularly in their adult day program and prior to this admission was doing well with no signs of agitation or combativeness.   Kirsten Mcmahon is fully connected with [facility] and is able to receive nursing home level care at home in the community if needed.  Interesting to note that patient has underlying dementia- her last MMSE was 16/30 indicating moderate dementia.    Kirsten Mcmahon offered for patient's primary care MD to be in contact with attending hospitalist- his name is Kirsten Mcmahon. She also offered assistance with discharge arrangements.    Kirsten Mcmahon and Kirsten Mcmahon can both be reached at [phone].   Kirsten Mcmahon, AGNP-C Palliative Medicine   Additional time: 35 minutes

## 2021-07-22 NOTE — Consult Note (Signed)
Consultation Note Date: 07/22/2021   Patient Name: Kirsten Mcmahon  DOB: 11/28/1946  MRN: [id number]  Age / Sex: 75 y.o., female  PCP: [place] Referring Physician: Shelly Coss, MD  Reason for Consultation:   HPI/Patient Profile: 75 y.o. female  with past medical history of ESRD on HD, strep bacteremia, stroke, DM2, seizure disorder admitted on 07/15/2021 with seizure during dialysis- noted that patient had been off [place] for several days prior to seizure. During admission she has been agitated at times requiring IV ativan which leaves her somnolent. Palliative medicine consulted for goals of care discussion.   Primary Decision Maker NEXT OF KIN- son- [name]  Discussion: Chart reviewed including Palliative consult notes from her last two admissions.   On exam Ms. Nevers is lethargic. She does not arouse to my voice or touch.  Spoke with patient's son [name]. Patient has been doing well at home prior to this admission. She was able to tolerate outpatient dialysis - he initially sat with her but her agitation had improved to where he did not have to accompany her.   [name] is puzzled by patient's somnolence during this admission. He notes that she was awake and alert after her seizure and had eaten. He feels her somnolence is related to the IV lorazepam- although chart review reveals intermittent lethargy and agitation prior to first dose of lorazepam which was given on the evening of 8/12.   Goals of care and code status were discussed. The role of dialysis and reasons for stopping dialysis was discussed.   Answered Maurice's questions relating to Hospice care and what trajectory would look like if dialysis was no longer offered as an option.      SUMMARY OF RECOMMENDATIONS -Continue current care- [name] is interested in seeing if her mental status can improve after a few  days of no more sedating medications- please do not give anymore ativan- if patient is agitated recommend extremely low dose of diazepam- patient is very sensitive to sedating medications -Full code -[name] will continue dialysis as long as it is offered as an intervention- we discussed the balance of patient being awake and too agitated for dialysis vs being somnolent- he wishes to continue to try and have her awake and tolerating dialysis- he does not wish to further discuss goals of care until it is clear that dialysis will no longer be offered as an intervention, when that occurs he is more open to discussing her end of life  Code Status/Advance Care Planning: Full code   Prognosis:   Unable to determine  Discharge Planning: To Be Determined  Primary Diagnoses: Present on Admission:  Breakthrough seizure ([place])  Essential hypertension  Mixed diabetic hyperlipidemia associated with type 2 diabetes mellitus ([place])  Hypothyroidism   Review of Systems  Unable to perform ROS: Mental status change   Physical Exam Vitals and nursing note reviewed.  Cardiovascular:     Rate and Rhythm: Normal rate.     Pulses: Normal pulses.  Pulmonary:  Effort: Pulmonary effort is normal.  Neurological:     Comments: lethargic    Vital Signs: BP (!) 141/89 (BP Location: Left Arm)   Pulse (!) 107   Temp 98.3 F (36.8 C) (Oral)   Resp 20   Wt 53.5 kg   SpO2 99%   BMI 21.57 kg/m  Pain Scale: PAINAD   Pain Score: 0-No pain   SpO2: SpO2: 99 % O2 Device:SpO2: 99 % O2 Flow Rate: .   IO: Intake/output summary: No intake or output data in the 24 hours ending 07/22/21 1522  LBM: Last BM Date:  (PTA) Baseline Weight: Weight: 55.3 kg Most recent weight: Weight: 53.5 kg     Palliative Assessment/Data: PPS: 40%     Thank you for this consult. Palliative medicine will continue to follow and assist as needed.   Time In: 1422 Time Out: 1544 Time Total: 82 minutes Greater than 50%  of  this time was spent counseling and coordinating care related to the above assessment and plan.  Signed by: Mariana Kaufman, AGNP-C Palliative Medicine    Please contact Palliative Medicine Team phone at [phone] for questions and concerns.  For individual provider: See Shea Evans

## 2021-07-22 NOTE — Progress Notes (Signed)
Neurology Progress Note  Brief HPI: 75 y.o. female with a PMHx of right MCA stroke with residual left hemiparesis, DM2, HTN, HLD, hypothyroidism, breast cancer in 2017, recent streptococcal bacteremia treated with Vancomycin in May 2022, with recent punctate strokes, ESRD on hemodialysis who presented to the ED 8/10 for evaluation of a breakthrough seizure in the setting of missed medication dose followed by hemodialysis further lowering serum Keppra concentration. Family with concern that patient is unable to tolerate Keppra and neurology was reconsulted for AED medication recommendations.  Major Interval Events: 07/17/2021- transitioned from [place] to Depakote due to patient's son with concern for increasing agitation and aggression since Keppra initiation in February.  07/20/2021- Patient noted to have progressive encephalopathy with confusion and speech abnormalities. EEG negative for seizures. CTH without acute intracranial abnormality. 07/21/2021- High dose Thiamine initiated   Subjective: Patient refused all PO medications overnight per RN documentation Patient remains lethargic on examination this morning   Exam: Vitals:   07/22/21 0005 07/22/21 0558  BP: 109/71 99/72  Pulse: 94 90  Resp: 19 16  Temp: 98 F (36.7 C) 98.1 F (36.7 C)  SpO2: 98% 95%   Gen: Laying in bed sleeping, in no acute distress Resp: non-labored breathing, regular respiratory rate, on room air Abd: soft, non-distended  Neuro: Mental Status: Somnolent, difficult to arouse.  She eventually wakes with brief eye opening during finger-stick blood glucose before falling back to sleep.  With constant arousal she eventually whispers "good morning" but does not answer further examiner questions. She does not follow commands Unable to assess mental status or speech at this time due to patient's somnolence. Cranial Nerves: Left pupil 5 mm, round, sluggishly reactive to light. Right pupil 3 mm, round, sluggishly  reactive to light, does not fixate or track examiner with brief one second eye opening to noxious stimuli, face appears symmetric resting and with grimace, hearing is intact to voice (responds good morning in response to examiner stating "good morning"), head appears grossly midline.  Motor: Withdraws bilateral upper extremities briskly with light application of noxious stimuli, withdraws right lower extremity more briskly than left lower extremity with application of noxious stimuli Increased tone in bilateral lower extremities.  Sensory: Grimaces and moans with application of noxious stimuli throughout Gait: Deferred  Pertinent Labs: CBC    Component Value Date/Time   WBC 11.7 (H) 07/20/2021 0934   RBC 3.84 (L) 07/20/2021 0934   HGB 11.3 (L) 07/20/2021 0934   HCT 35.9 (L) 07/20/2021 0934   PLT 241 07/20/2021 0934   MCV 93.5 07/20/2021 0934   MCH 29.4 07/20/2021 0934   MCHC 31.5 07/20/2021 0934   RDW 15.7 (H) 07/20/2021 0934   LYMPHSABS 1.8 07/16/2021 0530   MONOABS 0.8 07/16/2021 0530   EOSABS 0.2 07/16/2021 0530   BASOSABS 0.1 07/16/2021 0530   CMP     Component Value Date/Time   NA 133 (L) 07/20/2021 0934   K 3.9 07/20/2021 0934   CL 97 (L) 07/20/2021 0934   CO2 27 07/20/2021 0934   GLUCOSE 94 07/20/2021 0934   BUN 18 07/20/2021 0934   CREATININE 4.87 (H) 07/20/2021 0934   CALCIUM 8.4 (L) 07/20/2021 0934   PROT 6.1 (L) 07/16/2021 0530   ALBUMIN 2.6 (L) 07/20/2021 0934   AST 23 07/16/2021 0530   ALT 22 07/16/2021 0530   ALKPHOS 60 07/16/2021 0530   BILITOT 0.5 07/16/2021 0530   GFRNONAA 9 (L) 07/20/2021 0934   GFRAA 34 (L) 08/01/2018 1056   Lab Results  Component Value Date   VALPROATE 71 07/21/2021   Lab Results  Component Value Date   AYTKZSWF09 323 07/21/2021   Imaging Reviewed:  EEG 07/21/2021: "This study is suggestive of cortical dysfunction in right centro-parietal region consistent with prior craniotomy and underlying stroke.there is also moderate  degree of encephalopathy, nonspecific etiology.  No seizures or definite epileptiform discharges were seen throughout the recording."  EEG 07/17/2021: "This study is suggestive of cortical dysfunction in right frontal region consistent with prior craniotomy.  Additionally there is evidence of moderate diffuse encephalopathy, nonspecific etiology.  No seizures or definite epileptiform discharges were seen throughout the recording."  CT Head 07/20/2021: 1. No acute intracranial abnormality. 2. Old right MCA territory infarct and multiple old cerebellar infarcts. 3. Severe chronic small vessel disease and generalized atrophy.  Assessment: 75 yo female who is somnolent on examination today. Given her continued altered mental status, a repeat CTH was obtained on 07/20/2021 to look for a reversible cause, but CTH similar to prior without structural changes. There was a leukocytosis noted on 8/15, but CXR did not show evidence of infection. UA has not been completed but unsure if she is anuric (ESRD). Her acute encephalopathy is likely multifactorial due to delirium, seizure, uremia, recent stroke. MRI brain would be optimal, but doubt she will be able to tolerate and doubt it would yield anything useful at this point. Given noted speech changes, we EEG was repeated. EEG 8/12 and 8/16 are without seizures or definite epileptiform discharges but shows evidence of cortical dysfunction and encephalopathy. High dose Thiamine was started 07/20/2021.  Impression:  -Multifactorial toxic/metabolic encephalopathy. -Delirium  -History of seizure this hospitalization   Recommendations: -Continue B12 and thiamine; thiamine level pending  -Continue inpatient seizure precautions -Ativan 2mg  IV prn seizure lasting over 5 mins and call Neurology.  -Continue delirium precautions  -Avoid medications on the [place]'s list for the elderly.  -Will need out patient neurology f/up when discharged-referral made.  -Neurology will  follow along until improvement is noticed in mental status   Anibal Henderson, AGACNP-BC [facility] [phone]  I have seen the patient reviewed the above note.  She continues to be somnolent.  I think this could still simply be a hospital related hypoactive delirium, however she did seem to worsen after changing from Keppra to Depakote.  I wonder if this could be an adverse effect from Depakote.  Her level was not elevated, but her albumin is only 2.6, and therefore I wonder if her free level is higher than what we are seeing.  Of note, she has baseline dementia with an MMSE of 16 out of 30 at baseline.  That we could simply try reducing the dose, I think I would favor trialing a different antiepileptic and I will start Vimpat and discontinue Depakote.  Roland Rack, MD [facility] [phone]  If 7pm- 7am, please page neurology on call as listed in [place].

## 2021-07-23 LAB — GLUCOSE, CAPILLARY
Glucose-Capillary: 102 mg/dL — ABNORMAL HIGH (ref 70–99)
Glucose-Capillary: 110 mg/dL — ABNORMAL HIGH (ref 70–99)
Glucose-Capillary: 76 mg/dL (ref 70–99)
Glucose-Capillary: 80 mg/dL (ref 70–99)
Glucose-Capillary: 80 mg/dL (ref 70–99)

## 2021-07-23 NOTE — Progress Notes (Signed)
PROGRESS NOTE    Kirsten Mcmahon  CBU:[id number] DOB: May 13, 1946 DOA: 07/15/2021 PCP: [facility]   Chief Complain:Seizure  Brief Narrative: Patient is a 75 year old female with history of ESRD on dialysis on Monday Wednesday Friday, history of breast cancer on letrozole, history of stroke, diabetes type 2, seizure disorder who was brought to the emergency department after she had seizure-like activity during hemodialysis.  As per the report, patient ran out of her [place] on 8/9.  Patient had 30 minutes left in her hemodialysis session, she started to have seizure-like activity.  Hemodialysis was stopped and she was brought to the emergency department.  She was postictal on presentation, lethargic, disoriented.  CT head did not show any acute intracranial normalities.  Neurology was consulted who recommended to continue Keppra 500 mg daily and 250 mg postdialysis initially but her hospital course was prolonged due to persistent encephalopathy, lethargic.  Keppra was changed to Depakote and now the Depakote has been changed to Vimpat with improvement in mental status.Nephrology also following for dialysis.  Hospital course also remarkable for persistent low blood glucose requiring D10 infusion.  Overall status has now improved, encephalopathy is resolving.  Plan to monitor for next 24 to 48 hours before safe discharge.  Assessment & Plan:   Principal Problem:   Breakthrough seizure ([place]) Active Problems:   Essential hypertension   Hypothyroidism   Type 2 diabetes mellitus with ESRD (end-stage renal disease) ([facility])   End-stage renal disease on hemodialysis ([place])   Mixed diabetic hyperlipidemia associated with type 2 diabetes mellitus ([place])   Seizure ([place])   Altered mental status/agitation: Hospital course was remarkable for persistent confusion, agitation.  Usually alert and awake  at baseline but she was on/off confused as per son. Unclear etiology for persistent  encephalopathy.  Neurology recommended infectious work-up: Including UA, chest x-ray. CXR did not show PNA .She had mild leukocytosis, afebrile. MRI of the brain did not show any acute intracranial normalities but showed generalized atrophy, chronic small vessel ischemic changes. cEEG showed cortical dysfunction in the right central parietal region no seizures. We need to avoid sedatives, narcotics as much as possible. Seizure medication has been changed to Vimpat.  Finally she is more alert and awake now.  Seizure: Had seizure activity while at dialysis.  She recently ran out of [place] on 8/9.  EEG showed cortical dysfunction in the right frontal region, moderate diffuse encephalopathy, no seizures or epileptiform discharges.  Currently on vimpat  Hypoglycemia: Unknown etiology. Not on any meds at home. Hemoglobin A1c of 5.6 as per 6/22. She was on gentle D10 .  Now stopped.  Encourage oral intake  ESRD on dialysis: Has tunneled dialysis catheter on the right chest.  Nephrology consulted and following for dialysis.  History of hypertension: Continue current medication.  Monitor blood pressure  Hypothyroidism: Continue levothyroxine.TSH on 8/10 is normal  H/O stroke: She is weak on left side,walks with cane. As per son,she is usually alert and awake ,usually oriented but sometimes confused  History of breast cancer: On remission.  On letrozole  Goals of care: Elderly patient with multiple comorbidities, on dialysis, encephalopathic, poor oral intake.  Still a full code.  We consulted palliative care for goals of care discussion, plan is to continue full scope treatment.  Debility/deconditioning: PT/OT recommended SNF.  [facility] with [facility]  so goal is to go home.TOC following        DVT prophylaxis:Heparin [place] Code Status: Full Family Communication: Discussed with son  on phone on 07/23/21 on phone    Dispo: The patient is from: Home              Anticipated d/c is to: Home               Patient currently is not medically stable to d/c.   Difficult to place patient No  Plan to monitor for next 24 to 48 hours expecting mental status to return to baseline, stability on blood sugars   Consultants: neurology  Procedures:None  Antimicrobials:  Anti-infectives (From admission, onward)    None       Subjective: Patient seen and examined at the bedside this morning.  Surprisingly, she was opening her eyes, alert and awake today.  Did not  participate  with communication but she looks comfortable.  Blood sugars are also better today.  Long discussion with son on phone today and we plan to monitor her for next 1 to 2 days before discharging her   Objective: Vitals:   07/22/21 2352 07/23/21 0426 07/23/21 0800 07/23/21 1129  BP: 120/77 114/68 133/74 112/77  Pulse: 95 85 87 84  Resp: 16 20 19 18   Temp: 99 F (37.2 C) 98.4 F (36.9 C) 98.6 F (37 C) 98 F (36.7 C)  TempSrc: Oral Oral Oral   SpO2: 100% 100%  100%  Weight:       No intake or output data in the 24 hours ending 07/23/21 1402   Filed Weights   07/20/21 1230 07/22/21 0840 07/22/21 1150  Weight: 53 kg 53.5 kg 54 kg    Examination:  General exam: Overall comfortable, not in distress HEENT: PERRL Respiratory system:  no wheezes or crackles  Cardiovascular system: S1 & S2 heard, RRR.  Tunneled dialysis cath on the right chest Gastrointestinal system: Abdomen is nondistended, soft and nontender. Central nervous system: Alert and awake, confused Extremities: Left upper extremity lymphedema , no clubbing ,no cyanosis Skin: No rashes, no ulcers,no icterus     Data Reviewed: I have personally reviewed following labs and imaging studies  CBC: Recent Labs  Lab 07/20/21 0934 07/22/21 0934  WBC 11.7* 5.6  HGB 11.3* 10.4*  HCT 35.9* 34.7*  MCV 93.5 94.8  PLT 241 762   Basic Metabolic Panel: Recent Labs  Lab 07/18/21 0435 07/20/21 0934 07/22/21 0934  NA  --  133* 135  K  --  3.9  4.1  CL  --  97* 100  CO2  --  27 25  GLUCOSE  --  94 69*  BUN  --  18 15  CREATININE  --  4.87* 4.77*  CALCIUM  --  8.4* 8.5*  PHOS 3.8 3.9 4.3   GFR: Estimated Creatinine Clearance: 7.8 mL/min (A) (by C-G formula based on SCr of 4.77 mg/dL (H)). Liver Function Tests: Recent Labs  Lab 07/20/21 0934 07/22/21 0934  ALBUMIN 2.6* 2.5*   No results for input(s): LIPASE, AMYLASE in the last 168 hours. Recent Labs  Lab 07/21/21 1221  AMMONIA 21   Coagulation Profile: No results for input(s): INR, PROTIME in the last 168 hours. Cardiac Enzymes: No results for input(s): CKTOTAL, CKMB, CKMBINDEX, TROPONINI in the last 168 hours. BNP (last 3 results) No results for input(s): PROBNP in the last 8760 hours. HbA1C: No results for input(s): HGBA1C in the last 72 hours. CBG: Recent Labs  Lab 07/22/21 1621 07/22/21 2115 07/23/21 0042 07/23/21 0442 07/23/21 1129  GLUCAP 168* 107* 110* 102* 80   Lipid Profile: No results  for input(s): CHOL, HDL, LDLCALC, TRIG, CHOLHDL, LDLDIRECT in the last 72 hours. Thyroid Function Tests: No results for input(s): TSH, T4TOTAL, FREET4, T3FREE, THYROIDAB in the last 72 hours.  Anemia Panel: Recent Labs    07/21/21 0810  VITAMINB12 821   Sepsis Labs: No results for input(s): PROCALCITON, LATICACIDVEN in the last 168 hours.  Recent Results (from the past 240 hour(s))  Resp Panel by RT-PCR (Flu A&B, Covid) Nasopharyngeal Swab     Status: None   Collection Time: 07/15/21  5:50 PM   Specimen: Nasopharyngeal Swab; Nasopharyngeal(NP) swabs in vial transport medium  Result Value Ref Range Status   SARS Coronavirus 2 by RT PCR NEGATIVE NEGATIVE Final    Comment: (NOTE) SARS-CoV-2 target nucleic acids are NOT DETECTED.  The SARS-CoV-2 RNA is generally detectable in upper respiratory specimens during the acute phase of infection. The lowest concentration of SARS-CoV-2 viral copies this assay can detect is 138 copies/mL. A negative result does  not preclude SARS-Cov-2 infection and should not be used as the sole basis for treatment or other patient management decisions. A negative result may occur with  improper specimen collection/handling, submission of specimen other than nasopharyngeal swab, presence of viral mutation(s) within the areas targeted by this assay, and inadequate number of viral copies(<138 copies/mL). A negative result must be combined with clinical observations, patient history, and epidemiological information. The expected result is Negative.  Fact Sheet for Patients:  EntrepreneurPulse.com.au  Fact Sheet for Healthcare Providers:  IncredibleEmployment.be  This test is no t yet approved or cleared by the [place] FDA and  has been authorized for detection and/or diagnosis of SARS-CoV-2 by FDA under an Emergency Use Authorization (EUA). This EUA will remain  in effect (meaning this test can be used) for the duration of the COVID-19 declaration under Section 564(b)(1) of the Act, 21 U.S.C.section 360bbb-3(b)(1), unless the authorization is terminated  or revoked sooner.       Influenza A by PCR NEGATIVE NEGATIVE Final   Influenza B by PCR NEGATIVE NEGATIVE Final    Comment: (NOTE) The Xpert Xpress SARS-CoV-2/FLU/RSV plus assay is intended as an aid in the diagnosis of influenza from Nasopharyngeal swab specimens and should not be used as a sole basis for treatment. Nasal washings and aspirates are unacceptable for Xpert Xpress SARS-CoV-2/FLU/RSV testing.  Fact Sheet for Patients: EntrepreneurPulse.com.au  Fact Sheet for Healthcare Providers: IncredibleEmployment.be  This test is not yet approved or cleared by the [place] FDA and has been authorized for detection and/or diagnosis of SARS-CoV-2 by FDA under an Emergency Use Authorization (EUA). This EUA will remain in effect (meaning this test can be used) for the  duration of the COVID-19 declaration under Section 564(b)(1) of the Act, 21 U.S.C. section 360bbb-3(b)(1), unless the authorization is terminated or revoked.  Performed at [facility], [place] [address] [place], [place] [address]          Radiology Studies: MR BRAIN WO CONTRAST  Result Date: 07/22/2021 CLINICAL DATA:  Altered mental status.  Delirium. EXAM: MRI HEAD WITHOUT CONTRAST TECHNIQUE: Multiplanar, multiecho pulse sequences of the brain and surrounding structures were obtained without intravenous contrast. COMPARISON:  Head CT from 2 days ago FINDINGS: Brain: No acute infarction, hemorrhage, hydrocephalus, extra-axial collection or mass lesion. Sizable remote right MCA territory infarct centered on the insula and lateral frontal lobe with wallerian atrophy seen into the brainstem. Chronic small vessel ischemia with white matter gliosis and chronic lacunar infarcts at the deep gray nuclei and brainstem.  Numerous small remote bilateral cerebellar infarcts. Generalized brain atrophy. Vascular: Preserved flow voids Skull and upper cervical spine: Right-sided craniotomy overlying the patient's prior infarct. Sinuses/Orbits: Left cataract resection IMPRESSION: 1. No acute or reversible finding. 2. Brain atrophy and advanced chronic ischemic injury. Electronically Signed   By: Monte Fantasia M.D.   On: 07/22/2021 05:17        Scheduled Meds:  (feeding supplement) PROSource Plus  30 mL Oral TID BM   aspirin  81 mg Oral Daily   calcitRIOL  0.5 mcg Oral Q M,W,F-HD   Chlorhexidine Gluconate Cloth  6 each Topical Q0600   heparin  5,000 Units Subcutaneous Q8H   latanoprost  1 drop Both Eyes QHS   letrozole  2.5 mg Oral Daily   levothyroxine  112 mcg Oral Q0600   metoprolol succinate  50 mg Oral Daily   [START ON 07/28/2021] thiamine injection  100 mg Intravenous Daily   Continuous Infusions:  dextrose 50 mL/hr at 07/22/21 1341   lacosamide (VIMPAT) IV 100 mg (07/23/21 1249)    thiamine injection 250 mg (07/22/21 1650)     LOS: 6 days    Time spent: 35 mins.More than 50% of that time was spent in counseling and/or coordination of care.      Shelly Coss, MD [facility] P8/18/2022, 2:02 PM

## 2021-07-23 NOTE — Evaluation (Signed)
Occupational Therapy Evaluation Patient Details Name: Kirsten Mcmahon MRN: [id number] DOB: 04-07-1946 Today's Date: 07/23/2021    History of Present Illness 75 y.o. female presenting from dialysis to ED 8/10 with breakthrough seizures after missed dose of keppra. Patient from home with [facility]. Admission complicated by AMS, confusion and agitation. PMHx significant for HTN, HLD, DMII, stroke with residual left-sided weakness and extensor tone, wheelchair-bound, GERD, hypothyroidism, depression, breast cancer, CKD stage IIIb on HD.   Clinical Impression   PTA patient was living with family in a private residence with ramped entry and was requiring assist with ADLs grossly. [facility] provided wheelchair transport to dialysis center. Son reports patient could feed herself and assist with some bathing/dressing while seated EOB or on shower chair. Son also states patient could stand with some assist. Patient currently functioning below baseline demonstrating observed ADLs and bed mobility with +2 assist. Significant extensor tone in LLE limiting bed mobility. Sitting at EOB deferred. Patient also limited by deficits listed below including generalized weakness/debility, decreased cognition and decreased skin integrity and would benefit from continued acute OT services in prep for safe d/c to next level of care. If patient is to d/c home, she would benefit from +2 assist for ADLs at bed level and use of hoyer lift for functional transfers to wheelchair in prep for dialysis.       Follow Up Recommendations  SNF;Home health OT;Supervision/Assistance - 24 hour;Other (comment) (Given functional decline patient would benefit from SNF placement. If SNF is not an option (patient is a [facility] participant), strongly recommend 24/7 sup/assist from appropriate staff, hoyer lift for transfers, and custom [place] consult.)    Equipment Recommendations  Other (comment) (custom wheelchair consult (if patient has not had one yet).)     Recommendations for Other Services       Precautions / Restrictions Precautions Precautions: Fall Precaution Comments: HOH; wheelchair bound at baseline Restrictions Weight Bearing Restrictions: No      Mobility Bed Mobility Overal bed mobility: Needs Assistance Bed Mobility: Rolling Rolling: Max assist;+2 for physical assistance;+2 for safety/equipment         General bed mobility comments: Max A +2 for rolling R<>L in supine.    Transfers Overall transfer level: Needs assistance               General transfer comment: Deferred 2/2 extensor tone in LLE.    Balance                                           ADL either performed or assessed with clinical judgement   ADL Overall ADL's : Needs assistance/impaired     Grooming: Maximal assistance;Bed level Grooming Details (indicate cue type and reason): Max A and hand over hand at bedlevel.                     Toileting- Clothing Manipulation and Hygiene: Maximal assistance;+2 for physical assistance;+2 for safety/equipment;Bed level Toileting - Clothing Manipulation Details (indicate cue type and reason): Max A for hygiene/clothing management at bedlevel.       General ADL Comments: Patient requiring grossly Max A +2 for all ADLs at bedlevel.     Vision   Vision Assessment?: Vision impaired- to be further tested in functional context Additional Comments: R gaze preference; able to look L with cueing.     Perception     Praxis  Pertinent Vitals/Pain Pain Assessment: Faces Faces Pain Scale: Hurts a little bit Pain Location: BLE with PROM Pain Descriptors / Indicators: Grimacing Pain Intervention(s): Monitored during session     Hand Dominance Right   Extremity/Trunk Assessment Upper Extremity Assessment Upper Extremity Assessment: RUE deficits/detail;LUE deficits/detail RUE Deficits / Details: AROM restricted at shoulder joint; elbow, wrist and digits WFL.  Generalized weakness. RUE Coordination: decreased fine motor;decreased gross motor LUE Deficits / Details: L-hemiplegia; keeps elbow and digits flexed; able to be passively moved to neutral. LUE Coordination: decreased fine motor;decreased gross motor   Lower Extremity Assessment Lower Extremity Assessment: Defer to PT evaluation   Cervical / Trunk Assessment Cervical / Trunk Assessment: Other exceptions Cervical / Trunk Exceptions: Difficult to assess   Communication Communication Communication: Other (comment) (Minimally verbal)   Cognition Arousal/Alertness: Awake/alert Behavior During Therapy: Flat affect Overall Cognitive Status: No family/caregiver present to determine baseline cognitive functioning                                 General Comments: Family follows 1-step verbal commands with <25% accuracy.   General Comments       Exercises Exercises: General Upper Extremity General Exercises - Upper Extremity Shoulder Flexion: AAROM;PROM;Both;5 reps Shoulder Extension: AAROM;PROM;Both;5 reps Elbow Flexion: AAROM;PROM;Both;5 reps Elbow Extension: AAROM;PROM;Both;5 reps Wrist Flexion: AAROM;PROM;Both;5 reps Wrist Extension: AAROM;PROM;Both;5 reps   Shoulder Instructions      Home Living Family/patient expects to be discharged to:: Private residence Living Arrangements: Children Available Help at Discharge: Family;Other (Comment) ([facility]) Type of Home: House Home Access: Ramped entrance     [place]: One level     Bathroom Shower/Tub: Tub/shower unit         Home Equipment: Shower seat;Wheelchair - manual;Hospital bed;Other (comment);Bedside commode (Hoyer lift; cane (son didn't specify which type))   Additional Comments: Patient is a poor historian. History obtained from son via phonecall.      Prior Functioning/Environment Level of Independence: Needs assistance  Gait / Transfers Assistance Needed: Wheelchair bound at  baseline but son reports patient could take a few steps with assist; son prefers to lift patient himself and put her in wheelchair; hoyer lift used by dialysis center staff to get patient in dailysis chair ADL's / Homemaking Assistance Needed: Able to feed herself at baseline; assist for bathing/dressing; son reports patient able to make needs known   Comments: Receives support from [employer]        OT Problem List: Decreased strength      OT Treatment/Interventions: Self-care/ADL training;Therapeutic exercise;Energy conservation;DME and/or AE instruction;Therapeutic activities;Patient/family education;Balance training    OT Goals(Current goals can be found in the care plan section) Acute Rehab OT Goals Patient Stated Goal: Unable to state OT Goal Formulation: Patient unable to participate in goal setting Time For Goal Achievement: 08/06/21 Potential to Achieve Goals: Fair ADL Goals Pt Will Perform Eating: with set-up;sitting Pt Will Perform Grooming: sitting;with mod assist Pt Will Perform Upper Body Dressing: with mod assist;sitting Pt Will Perform Lower Body Dressing: with mod assist;sitting/lateral leans;bed level Pt/caregiver will Perform Home Exercise Program: Increased ROM;Increased strength;Right Upper extremity;With written HEP provided;With minimal assist Additional ADL Goal #1: Patient will maintain unsupported sitting balance at EOB with Min A in prep for seated ADLs. Additional ADL Goal #2: Patient will follow 1-step verbal commands with 75% accuracy in prep for ADLs.  OT Frequency: Min 2X/week   Barriers to D/C:  Co-evaluation PT/OT/SLP Co-Evaluation/Treatment: Yes Reason for Co-Treatment: Complexity of the patient's impairments (multi-system involvement)   OT goals addressed during session: ADL's and self-care      AM-PAC OT "6 Clicks" Daily Activity     Outcome Measure Help from another person eating meals?: A Lot Help from another person taking care of  personal grooming?: Total Help from another person toileting, which includes using toliet, bedpan, or urinal?: Total Help from another person bathing (including washing, rinsing, drying)?: Total Help from another person to put on and taking off regular upper body clothing?: Total Help from another person to put on and taking off regular lower body clothing?: Total 6 Click Score: 7   End of Session Nurse Communication: Mobility status;Need for lift equipment  Activity Tolerance: Patient tolerated treatment well;Other (comment) (Decreased participation given functional decline.) Patient left: in bed;with call bell/phone within reach;with bed alarm set  OT Visit Diagnosis: Muscle weakness (generalized) (M62.81);Hemiplegia and hemiparesis Hemiplegia - Right/Left: Left Hemiplegia - dominant/non-dominant: Non-Dominant Hemiplegia - caused by: Cerebral infarction                Time: 4503-8882 OT Time Calculation (min): 18 min Charges:  OT General Charges $OT Visit: 1 Visit OT Evaluation $OT Eval Moderate Complexity: 1 Mod  Abron Neddo H. OTR/L Supplemental OT, Department of rehab services [phone]  Jorita Bohanon R H. 07/23/2021, 1:56 PM

## 2021-07-23 NOTE — Progress Notes (Signed)
[facility] Progress Note   Subjective:     Having hypoglycemia d/t poor oral intake.  Confused/ somnolent.    Objective Vitals:   07/22/21 2352 07/23/21 0426 07/23/21 0800 07/23/21 1129  BP: 120/77 114/68 133/74 112/77  Pulse: 95 85 87 84  Resp: 16 20 19 18   Temp: 99 F (37.2 C) 98.4 F (36.9 C) 98.6 F (37 C) 98 F (36.7 C)  TempSrc: Oral Oral Oral   SpO2: 100% 100%  100%  Weight:       Physical Exam General: frail-appearing, somnolent Lungs: clear bilaterally Heart: RRR. No murmur, rubs or gallops.  Abdomen: soft, nontender, +BS Lower extremities:no edema  Neuro: somnolent Dialysis Access: [facility]  Filed Weights   07/20/21 1230 07/22/21 0840 07/22/21 1150  Weight: 53 kg 53.5 kg 54 kg   No intake or output data in the 24 hours ending 07/23/21 1221   Additional Objective Labs: Basic Metabolic Panel: Recent Labs  Lab 07/18/21 0435 07/20/21 0934 07/22/21 0934  NA  --  133* 135  K  --  3.9 4.1  CL  --  97* 100  CO2  --  27 25  GLUCOSE  --  94 69*  BUN  --  18 15  CREATININE  --  4.87* 4.77*  CALCIUM  --  8.4* 8.5*  PHOS 3.8 3.9 4.3   Liver Function Tests: Recent Labs  Lab 07/20/21 0934 07/22/21 0934  ALBUMIN 2.6* 2.5*   No results for input(s): LIPASE, AMYLASE in the last 168 hours. CBC: Recent Labs  Lab 07/20/21 0934 07/22/21 0934  WBC 11.7* 5.6  HGB 11.3* 10.4*  HCT 35.9* 34.7*  MCV 93.5 94.8  PLT 241 190   Blood Culture    Component Value Date/Time   SDES BLOOD LEFT FOREARM 05/17/2021 0953   SPECREQUEST  05/17/2021 0953    BOTTLES DRAWN AEROBIC AND ANAEROBIC Blood Culture adequate volume   CULT  05/17/2021 0953    NO GROWTH 5 DAYS Performed at [facility], [place] [address], [place], [place] [address]    REPTSTATUS 05/22/2021 FINAL 05/17/2021 0953    Cardiac Enzymes: No results for input(s): CKTOTAL, CKMB, CKMBINDEX, TROPONINI in the last 168 hours. CBG: Recent Labs  Lab 07/22/21 1621 07/22/21 2115  07/23/21 0042 07/23/21 0442 07/23/21 1129  GLUCAP 168* 107* 110* 102* 80   Iron Studies: No results for input(s): IRON, TIBC, TRANSFERRIN, FERRITIN in the last 72 hours. Lab Results  Component Value Date   INR 1.0 04/30/2021   Studies/Results: MR BRAIN WO CONTRAST  Result Date: 07/22/2021 CLINICAL DATA:  Altered mental status.  Delirium. EXAM: MRI HEAD WITHOUT CONTRAST TECHNIQUE: Multiplanar, multiecho pulse sequences of the brain and surrounding structures were obtained without intravenous contrast. COMPARISON:  Head CT from 2 days ago FINDINGS: Brain: No acute infarction, hemorrhage, hydrocephalus, extra-axial collection or mass lesion. Sizable remote right MCA territory infarct centered on the insula and lateral frontal lobe with wallerian atrophy seen into the brainstem. Chronic small vessel ischemia with white matter gliosis and chronic lacunar infarcts at the deep gray nuclei and brainstem. Numerous small remote bilateral cerebellar infarcts. Generalized brain atrophy. Vascular: Preserved flow voids Skull and upper cervical spine: Right-sided craniotomy overlying the patient's prior infarct. Sinuses/Orbits: Left cataract resection IMPRESSION: 1. No acute or reversible finding. 2. Brain atrophy and advanced chronic ischemic injury. Electronically Signed   By: Monte Fantasia M.D.   On: 07/22/2021 05:17    Medications:  dextrose 50 mL/hr at 07/22/21 1341  lacosamide (VIMPAT) IV 100 mg (07/22/21 2218)   thiamine injection 250 mg (07/22/21 1650)    (feeding supplement) PROSource Plus  30 mL Oral TID BM   aspirin  81 mg Oral Daily   calcitRIOL  0.5 mcg Oral Q M,W,F-HD   Chlorhexidine Gluconate Cloth  6 each Topical Q0600   heparin  5,000 Units Subcutaneous Q8H   latanoprost  1 drop Both Eyes QHS   letrozole  2.5 mg Oral Daily   levothyroxine  112 mcg Oral Q0600   metoprolol succinate  50 mg Oral Daily   [START ON 07/28/2021] thiamine injection  100 mg Intravenous Daily    Dialysis  Orders: HD Orders: [name] MWF 3:45 hr 180 NRE 400/Autoflow 1.5 56 kg 3.0 K/2.5 Ca TDC  -No Heparin -Mircera 150 mcg IV Q 2 weeks (last dose 07/15/2021) -Venofer 50 mg IV weekly (last dose 07/15/2021) -Calcitriol 0.5 mcg PO TIW  Assessment/Plan: Seizure-experienced a seizure during her last outpatient HD on 07/15/21.  Neurology following.  Was on depakote but more somnolent- now on vimpat and now MS is improving! Altered Mental Status-Currently sleeping. CT head (-) acute intracranial abnormalities x 2, last 07/20/21.  Neurology re-consulted.  Noted previous episodes of combative/agitation-this can negatively impact whether she will be able to continue outpatient HD.  MS improving.   ESRD - on HD MWF. Plan for HD 07/24/21 per usual schedule. UF as tolerated. Hypertension/volume  - Patient euvolemic on exam, Bps improving-continue HTN medication-monitor BP and HR. Anemia of CKD - Hgb 9.4-recently received ESA and Fe in outpatient-monitor trends Secondary Hyperparathyroidism -  Ca 8.5-continue PO calcitriol.  Nutrition - Continue carb-modified renal diet. Albumin 2.6-continue protein supplements. Dispo: overall poor prognosis--> ? If HD is contributing to [place] [facility] [phone] 07/23/2021,12:21 PM  LOS: 6 days

## 2021-07-23 NOTE — Evaluation (Signed)
Physical Therapy Evaluation Patient Details Name: Kirsten Mcmahon MRN: [id number] DOB: 09-30-1946 Today's Date: 07/23/2021   History of Present Illness  75 y.o. female presenting from dialysis to ED 8/10 with breakthrough seizures after missed dose of keppra. Patient from home with [facility]. Admission complicated by AMS, confusion and agitation. PMHx significant for HTN, HLD, DMII, stroke with residual left-sided weakness and extensor tone, wheelchair-bound, GERD, hypothyroidism, depression, breast cancer, CKD stage IIIb on HD.  Clinical Impression  Pt admitted secondary to problem above with deficits below. Pt requiring max A +2 to perform bed mobility tasks for clean up. Noted to have increased extensor tone in LLE and LUE. OT spoke with pt's son and prior to admission, pt was able to assist with standing and other ADL tasks. Pt with notable decline in function and will have increased difficulty performing mobility tasks. If family and [facility] services can provide necessary levels of assist, recommending return home with increased support. However, if services unable to be provided, will need SNF level therapies. Will continue to follow acutely.     Follow Up Recommendations SNF;Home health PT;Supervision/Assistance - 24 hour (SNF vs HHPT (follow up with [facility]) depending on family ability to assist)    [place] [facility] bed    Recommendations for Other Services       Precautions / Restrictions Precautions Precautions: Fall Precaution Comments: HOH; wheelchair bound at baseline Restrictions Weight Bearing Restrictions: No      Mobility  Bed Mobility Overal bed mobility: Needs Assistance Bed Mobility: Rolling Rolling: Max assist;+2 for physical assistance;+2 for safety/equipment         General bed mobility comments: Max A +2 for rolling R<>L in supine for clean up.    Transfers Overall transfer level: Needs assistance               General transfer comment:  Deferred 2/2 extensor tone in LLE.  Ambulation/Gait                Stairs            Wheelchair Mobility    Modified Rankin (Stroke Patients Only)       Balance                                             Pertinent Vitals/Pain Pain Assessment: Faces Faces Pain Scale: Hurts a little bit Pain Location: BLE with PROM Pain Descriptors / Indicators: Grimacing Pain Intervention(s): Limited activity within patient's tolerance;Monitored during session;Repositioned    Home Living Family/patient expects to be discharged to:: Private residence Living Arrangements: Children Available Help at Discharge: Family;Other (Comment) ([facility]) Type of Home: House Home Access: Ramped entrance     Home Layout: One level Home Equipment: Shower seat;Wheelchair - manual;Hospital bed;Other (comment);Bedside commode (Hoyer lift; cane (son didn't specify which type)) Additional Comments: Patient is a poor historian. History obtained from son via phone call.    Prior Function Level of Independence: Needs assistance   Gait / Transfers Assistance Needed: Wheelchair bound at baseline but son reports patient could take a few steps with assist; son prefers to lift patient himself and put her in wheelchair; hoyer lift used by dialysis center staff to get patient in dailysis chair  ADL's / Homemaking Assistance Needed: Able to feed herself at baseline; assist for bathing/dressing; son reports patient able to make needs known  Comments: Receives support from [employer]: Right    Extremity/Trunk Assessment   Upper Extremity Assessment Upper Extremity Assessment: Defer to OT evaluation RUE Deficits / Details: AROM restricted at shoulder joint; elbow, wrist and digits WFL. Generalized weakness. RUE Coordination: decreased fine motor;decreased gross motor LUE Deficits / Details: L-hemiplegia; keeps elbow and digits flexed; able to be  passively moved to neutral. LUE Coordination: decreased fine motor;decreased gross motor    Lower Extremity Assessment Lower Extremity Assessment: LLE deficits/detail;RLE deficits/detail RLE Deficits / Details: Pt unable to follow commands to actively move RLE. Was able to perform PROM. Knee flexion noted to be somewhat limited. LLE Deficits / Details: Knee and hip flexion very limited secondary to extensor tone. Was able to flex knee ~30 degrees.    Cervical / Trunk Assessment Cervical / Trunk Assessment: Other exceptions Cervical / Trunk Exceptions: Difficult to assess  Communication   Communication: Other (comment) (Minimally verbal)  Cognition Arousal/Alertness: Awake/alert Behavior During Therapy: Flat affect Overall Cognitive Status: No family/caregiver present to determine baseline cognitive functioning                                 General Comments: Family follows 1-step verbal commands with <25% accuracy.      General Comments      Exercises General Exercises - Upper Extremity Shoulder Flexion: AAROM;PROM;Both;5 reps Shoulder Extension: AAROM;PROM;Both;5 reps Elbow Flexion: AAROM;PROM;Both;5 reps Elbow Extension: AAROM;PROM;Both;5 reps Wrist Flexion: AAROM;PROM;Both;5 reps Wrist Extension: AAROM;PROM;Both;5 reps General Exercises - Lower Extremity Heel Slides: PROM;Both;5 reps;Supine   Assessment/Plan    PT Assessment Patient needs continued PT services  PT Problem List Decreased strength;Decreased activity tolerance;Decreased balance;Decreased mobility;Decreased range of motion;Decreased cognition;Decreased knowledge of use of DME;Decreased safety awareness;Decreased knowledge of precautions;Decreased coordination       PT Treatment Interventions DME instruction;Functional mobility training;Therapeutic activities;Therapeutic exercise;Balance training;Patient/family education    PT Goals (Current goals can be found in the Care Plan section)   Acute Rehab PT Goals Patient Stated Goal: Unable to state PT Goal Formulation: Patient unable to participate in goal setting Time For Goal Achievement: 08/06/21 Potential to Achieve Goals: Fair    Frequency Min 3X/week   Barriers to discharge        Co-evaluation PT/OT/SLP Co-Evaluation/Treatment: Yes Reason for Co-Treatment: Complexity of the patient's impairments (multi-system involvement) PT goals addressed during session: Mobility/safety with mobility OT goals addressed during session: ADL's and self-care       AM-PAC PT "6 Clicks" Mobility  Outcome Measure Help needed turning from your back to your side while in a flat bed without using bedrails?: Total Help needed moving from lying on your back to sitting on the side of a flat bed without using bedrails?: Total Help needed moving to and from a bed to a chair (including a wheelchair)?: Total Help needed standing up from a chair using your arms (e.g., wheelchair or bedside chair)?: Total Help needed to walk in hospital room?: Total Help needed climbing 3-5 steps with a railing? : Total 6 Click Score: 6    End of Session   Activity Tolerance: Patient tolerated treatment well Patient left: in bed;with call bell/phone within reach;with bed alarm set Nurse Communication: Mobility status PT Visit Diagnosis: Other abnormalities of gait and mobility (R26.89);Difficulty in walking, not elsewhere classified (R26.2)    Time: 6734-1937 PT Time Calculation (min) (ACUTE ONLY): 18 min   Charges:  PT Evaluation $PT Eval Moderate Complexity: 1 Mod          Reuel Derby, PT, DPT  Acute Rehabilitation Services  Pager: [phone] Office: [phone]   Rudean Hitt 07/23/2021, 2:28 PM

## 2021-07-23 NOTE — TOC Initial Note (Signed)
Transition of Care [facility]) - Initial/Assessment Note    Patient Details  Name: Kirsten Mcmahon MRN: [id number] Date of Birth: 1946-11-27  Transition of Care [facility]) CM/SW Contact:    Geralynn Ochs, LCSW Phone Number: 07/23/2021, 2:24 PM  Clinical Narrative:        CSW spoke with patient's [facility] social worker, Marcie Bal, yesterday about barriers to discharge, and discussed patient's continued confusion and concern about ability to go to outpatient dialysis. Marcie Bal indicated that she wanted to advocate for the patient going home, as she was hopeful that the patient's mental status would improve upon return home, as we expect patient's with dementia to become more confused when they have a lengthy hospital stay. CSW passed message on to MD, who attempted to work towards discharging patient home today. CSW coordinated with PT/OT about patient's current mobility deficits and needs, and spoke with patient's [facility]ACE social worker again today. CSW discussed with PACE concern that she may need SNF as opposed to increased help at home, but PACE feels that the patient can manage at home with increased support and therapy within the center. After further discussion with MD, patient is not medically ready for discharge at this time. CSW to continue to follow for patient to return home. PACE will provide transportation for patient home when medically stable.           Expected Discharge Plan: [place] Barriers to Discharge: Continued Medical Work up   Patient Goals and CMS Choice Patient states their goals for this hospitalization and ongoing recovery are:: patient unable to participate in goal setting, not fully oriented CMS Medicare.gov Compare Post Acute Care list provided to:: Patient Represenative (must comment) Choice offered to / list presented to : Adult Children  Expected Discharge Plan and Services Expected Discharge Plan: [place] Choice:  [place] arrangements for the past 2 months: Single Family Home                                      Prior Living Arrangements/Services Living arrangements for the past 2 months: Single Family Home Lives with:: Adult Children Patient language and need for interpreter reviewed:: No Do you feel safe going back to the place where you live?: Yes      Need for Family Participation in Patient Care: Yes (Comment) Care giver support system in place?: Yes (comment) Current home services: DME, Homehealth aide Criminal Activity/Legal Involvement Pertinent to Current Situation/Hospitalization: No - Comment as needed  Activities of Daily Living      Permission Sought/Granted Permission sought to share information with : Facility Sport and exercise psychologist, Family Supports Permission granted to share information with : Yes, Verbal Permission Granted  Share Information with NAME: [name]  Permission granted to share info w AGENCY: [facility]ACE  Permission granted to share info w Relationship: Son     Emotional Assessment   Attitude/Demeanor/Rapport: Unable to Assess Affect (typically observed): Unable to Assess Orientation: : Oriented to Self Alcohol / Substance Use: Not Applicable Psych Involvement: No (comment)  Admission diagnosis:  Seizure ([place]) [R56.9] ESRD (end stage renal disease) ([place]) [N18.6] History of CVA (cerebrovascular accident) [Z86.73] Breakthrough seizure ([place]) [G40.919] Patient Active Problem List   Diagnosis Date Noted   Seizure ([place]) 07/17/2021   Mixed diabetic hyperlipidemia associated with type 2 diabetes mellitus ([place]) 07/16/2021   Breakthrough seizure (  [place]) 07/15/2021   Recent cerebrovascular accident (CVA) 05/17/2021   History of CVA with residual deficit 05/17/2021   Cerebral embolism with cerebral infarction 05/13/2021   Physical deconditioning 04/30/2021   Generalized muscle weakness 04/30/2021   Pressure injury of skin 04/29/2021   AKI (acute  kidney injury) ([place]) 04/28/2021   Streptococcal sepsis, unspecified ([place]) 04/18/2021   Streptococcal bacteremia 04/18/2021   Leukocytosis 04/17/2021   Fever 04/17/2021   End-stage renal disease on hemodialysis ([place])    Acute kidney injury superimposed on CKD ([place]) 04/09/2021   ARF (acute renal failure) ([place]) 01/18/2021   Fall 01/17/2021   Essential hypertension    Hypothyroidism    Stroke ([place])    GERD (gastroesophageal reflux disease)    Anemia in chronic kidney disease (CKD)    Acute metabolic encephalopathy    Acute renal failure superimposed on stage 3b chronic kidney disease ([place])    Type 2 diabetes mellitus with ESRD (end-stage renal disease) (HCC)    Abnormal mammogram of left breast 07/30/2018   Closed displaced oblique fracture of shaft of left humerus 03/14/2018   Osteopenia 01/20/2018   Multiple thyroid nodules 07/13/2017   Tracheal deviation 07/13/2017   Malignant neoplasm of upper-outer quadrant of right breast in female, estrogen receptor positive ([place]) 01/20/2017   Primary vulvar squamous cell carcinoma ([place]) 01/20/2017   PCP:  [place]:   [place], [place] [address] Phone: [phone] Fax: [phone]  [id number] - [place], [place] [address] [place] [address] Phone: [phone] Fax: [phone]     Social Determinants of Health (SDOH) Interventions    Readmission Risk Interventions Readmission Risk Prevention Plan 05/20/2021 05/16/2021 04/11/2021  Transportation Screening Complete Complete Complete  PCP or Specialist Appt within 3-5 Days - - Complete  HRI or [place] - - Complete  Social Work Consult for [place] Planning/Counseling - - Complete  Palliative Care Screening - - Not Applicable  Medication Review Press photographer) Complete Complete Complete  PCP or  Specialist appointment within 3-5 days of discharge Complete Complete -  [place] or Home Care Consult Complete Complete -  SW Recovery Care/Counseling Consult - Complete -  Palliative Care Screening Complete Complete -  [place] Patient Refused Patient Refused -  Some recent data might be hidden

## 2021-07-23 NOTE — Progress Notes (Signed)
Neurology Progress Note  Brief HPI:  75 y.o. female with a PMHx of right MCA stroke with residual left hemiparesis, DM2, HTN, HLD, hypothyroidism, breast cancer in 2017, recent streptococcal bacteremia treated with Vancomycin in May 2022, with recent punctate strokes, ESRD on hemodialysis who presented to the ED 8/10 for evaluation of a breakthrough seizure in the setting of missed medication dose followed by hemodialysis further lowering serum Keppra concentration. Family with concern that patient is unable to tolerate Keppra and neurology was reconsulted for AED medication recommendations.  Major Interval Events:  07/17/2021- transitioned from [place] to Depakote due to patient's son with concern for increasing agitation and aggression since Keppra initiation in February.  07/20/2021- Patient noted to have progressive encephalopathy with confusion and speech abnormalities. EEG negative for seizures. CTH without acute intracranial abnormality. 07/21/2021- High dose Thiamine initiated  07/22/2021: 2 hypoglycemic events requiring dextrose noted thought to be 2/2 poor oral intake. Due to persistent somnolence, Vimpat was initiated in place of Depakote with concern for somnolence as an adverse effect of Depakote.  Subjective: - Patient with potential hypoglycemia during examination yesterday with CBG of 53 noted at 8:23 with another hypoglycemia event yesterday afternoon around 13:00 with a CBG of 56 thought to be related to poor oral intake 2/2 somnolence. S/p dextrose administration. - No further overnight events noted  - Remains lethargic with some improvement in alertness compared to yesterday on NP assessment  Exam: Vitals:   07/22/21 2352 07/23/21 0426  BP: 120/77 114/68  Pulse: 95 85  Resp: 16 20  Temp: 99 F (37.2 C) 98.4 F (36.9 C)  SpO2: 100% 100%   Gen: Laying comfortably in bed, sleeping, in no acute distress Resp: non-labored breathing, no respiratory distress, on room air Abd:  soft, non-tender, non-distended  Neuro: Mental Status: Asleep initially, wakes to loud voice.  With stimulation she wakes further and is able to keep her eyes open without immediately falling back to sleep.  She responds "good morning" to examiner and states that she is "fine" and is not in pain.  She is able to state her son's name correctly and states her last name correctly but blankly stares at examiner when asked further orientation questions. She does state "no" when asked if she is in the hospital.  She intermittently follows simple commands for examiner.  Does not attempt to name objects despite fixating on them. Cranial Nerves: PERRL 2 mm and sluggish, fixates on examiner but does not track, face appears symmetric resting and with speech, hearing is intact to voice, head appears grossly midline. Does not protrude tongue or shrug shoulders to command. Motor: Squeezes with right hand 4-/5 grip strength, no movement of left hand to command, wiggles toes on the right and minimally on the left.  She has some spontaneous movement on the right upper and lower extremities on waking. LUE with increased tone, left hand contracture. Left hemiparesis chronic from previous CVA. Increased tone in bilateral lower extremities L > R. Sensory: Equally reactive to light application of noxious stimuli throughout, does not provide examiner information regarding sensation/symmetry to light touch  Gait: Deferred  Pertinent Labs: CBC    Component Value Date/Time   WBC 5.6 07/22/2021 0934   RBC 3.66 (L) 07/22/2021 0934   HGB 10.4 (L) 07/22/2021 0934   HCT 34.7 (L) 07/22/2021 0934   PLT 190 07/22/2021 0934   MCV 94.8 07/22/2021 0934   MCH 28.4 07/22/2021 0934   MCHC 30.0 07/22/2021 0934   RDW 15.6 (H)  07/22/2021 0934   LYMPHSABS 1.8 07/16/2021 0530   MONOABS 0.8 07/16/2021 0530   EOSABS 0.2 07/16/2021 0530   BASOSABS 0.1 07/16/2021 0530   CMP     Component Value Date/Time   NA 135 07/22/2021  0934   K 4.1 07/22/2021 0934   CL 100 07/22/2021 0934   CO2 25 07/22/2021 0934   GLUCOSE 69 (L) 07/22/2021 0934   BUN 15 07/22/2021 0934   CREATININE 4.77 (H) 07/22/2021 0934   CALCIUM 8.5 (L) 07/22/2021 0934   PROT 6.1 (L) 07/16/2021 0530   ALBUMIN 2.5 (L) 07/22/2021 0934   AST 23 07/16/2021 0530   ALT 22 07/16/2021 0530   ALKPHOS 60 07/16/2021 0530   BILITOT 0.5 07/16/2021 0530   GFRNONAA 9 (L) 07/22/2021 0934   GFRAA 34 (L) 08/01/2018 1056   Imaging Reviewed:  EEG 07/21/2021: "This study is suggestive of cortical dysfunction in right centro-parietal region consistent with prior craniotomy and underlying stroke.there is also moderate degree of encephalopathy, nonspecific etiology.  No seizures or definite epileptiform discharges were seen throughout the recording."   EEG 07/17/2021: "This study is suggestive of cortical dysfunction in right frontal region consistent with prior craniotomy.  Additionally there is evidence of moderate diffuse encephalopathy, nonspecific etiology.  No seizures or definite epileptiform discharges were seen throughout the recording."   CT Head 07/20/2021: 1. No acute intracranial abnormality. 2. Old right MCA territory infarct and multiple old cerebellar infarcts. 3. Severe chronic small vessel disease and generalized atrophy.  Assessment: 75 yo female who is somnolent on examination today. Given her continued altered mental status, a repeat CTH was obtained on 07/20/2021 to look for a reversible cause, but CTH similar to prior without structural changes. There was a leukocytosis noted on 8/15, but CXR did not show evidence of infection. UA has not been completed but unsure if she is anuric (ESRD). Her acute encephalopathy is likely multifactorial due to delirium, seizure, uremia, recent stroke. MRI brain would be optimal, but doubt she will be able to tolerate and doubt it would yield anything useful at this point. Given noted speech changes, we EEG was  repeated. EEG 8/12 and 8/16 are without seizures or definite epileptiform discharges but shows evidence of cortical dysfunction and encephalopathy. High dose Thiamine was started 07/20/2021.   Patient with persistent decreased level of consciousness during inpatient hospitalization possibly 2/2 hospital-related hypoactive delirium versus adverse medication effect with worsening of alertness following Depakote initiation- concern for possible supratherapeutic level in the setting of hypoalbuminemia. Depakote transitioned to Vimpat 07/22/2021. Patient also with 2 hypoglycemia events s/p dextrose thought to be 2/2 impaired PO intake with AMS.  Impression:  -Multifactorial toxic/metabolic encephalopathy. -Hypoactive delirium -History of seizure this hospitalization   Recommendations: -Continue management of comorbid conditions per primary team as you are -Continue Vimpat and continue monitor mental status -Continue B12 and thiamine; thiamine level pending  -Continue inpatient seizure precautions -Ativan 2mg  IV prn seizure lasting over 5 mins and call Neurology -Continue delirium precautions  -Avoid medications on the [place]'s list for the elderly.  -Will need out patient neurology f/up when discharged-referral made.  -Neurology will follow along until improvement is noticed in mental status   Anibal Henderson, AGACNP-BC [facility] [phone]   I saw the patient later in the day and she was markedly improved.  She was able to answer questions with sentences, though her speech is still slow.  She was eating with the nurse practitioner from palliative care, and still needing some help with  feeding herself.  I suspect that some of her delirium was likely contributed to by Depakote given the timing, and also the improvement after stopping it.  Is on that she continues to improve, I would not make any further changes.  Roland Rack, MD [facility] [phone]  If  7pm- 7am, please page neurology on call as listed in [place].

## 2021-07-23 NOTE — Progress Notes (Signed)
Daily Progress Note   Patient Name: Kirsten Mcmahon       Date: 07/23/2021 DOB: [birth year]  Age: 75 y.o. MRN#: [id number] Attending Physician: Kirsten Coss, MD Primary Care Physician: Kirsten Mcmahon Admit Date: 07/15/2021  Reason for Consultation/Follow-up: Establishing goals of care  Patient Profile/HPI: 75 y.o. female  with past medical history of ESRD on HD, strep bacteremia, stroke, DM2, seizure disorder admitted on 07/15/2021 with seizure during dialysis- noted that patient had been off Keppra for several days prior to seizure. During admission she has been agitated at times requiring IV ativan which leaves her somnolent. Her seizure medications have also required adjustment. Having poor po intake with episodes of hypoglycemia. Palliative medicine consulted for goals of care discussion.   Subjective: Kirsten Mcmahon is more awake this morning. Eyes are open and she tracks me. She says "hello". States she feels "better".  I was able to feed her several bites of breakfast. She had prolonged mastication time.  She took the fork when given to her and was able to place the food in her mouth. She coughed once but was able to clear her secretions. Discussed with nursing- no coughing with meds and no coughing when eating yesterday.  Dr. Leonel Mcmahon was present- noted improvement in mental status after changing to vimpat from depakote.  Called Kirsten Mcmahon and gave update. Discussed with him baseline dementia. He notes that he wasn't aware of this but has noted changes in her memory. I reviewed the MMSE results that were relayed to me by the SW from [place] indicating a moderate cognitive deficit. Discussed vascular dementia and its trajectory.  Also discussed her preference for foods- she typically  chooses softer foods, she has difficulty chewing meats- her favorite food for breakfast is grits, she also enjoys macaroni and cheese and mashed potatoes.  Her baseline functional status is that she is able to converse and communicate her needs- she sits on the bed and picks out her outfits and her family assists her in dressing. She is able to ambulate with a cane and one person assist. She has a bedside commode she uses for toileting- she is continent of stool.   Review of Systems  Unable to perform ROS: Mental status change    Physical Exam Vitals and nursing note reviewed.  Neurological:  Mental Status: She is alert.     Comments: UTA orientation, follows some commands            Vital Signs: BP 133/74 (BP Location: Left Arm)   Pulse 87   Temp 98.6 F (37 C) (Oral)   Resp 19   Wt 54 kg   SpO2 100%   BMI 21.77 kg/m  SpO2: SpO2: 100 % O2 Device: O2 Device: Room Air O2 Flow Rate:    Intake/output summary:  Intake/Output Summary (Last 24 hours) at 07/23/2021 1053 Last data filed at 07/22/2021 1150 Gross per 24 hour  Intake --  Output 96 ml  Net -96 ml   LBM: Last BM Date:  (PTA) Baseline Weight: Weight: 55.3 kg Most recent weight: Weight: 54 kg       Palliative Assessment/Data: PPS: 30%      Patient Active Problem List   Diagnosis Date Noted   Seizure ([place]) 07/17/2021   Mixed diabetic hyperlipidemia associated with type 2 diabetes mellitus ([place]) 07/16/2021   Breakthrough seizure ([place]) 07/15/2021   Recent cerebrovascular accident (CVA) 05/17/2021   History of CVA with residual deficit 05/17/2021   Cerebral embolism with cerebral infarction 05/13/2021   Physical deconditioning 04/30/2021   Generalized muscle weakness 04/30/2021   Pressure injury of skin 04/29/2021   AKI (acute kidney injury) ([place]) 04/28/2021   Streptococcal sepsis, unspecified ([place]) 04/18/2021   Streptococcal bacteremia 04/18/2021   Leukocytosis 04/17/2021   Fever 04/17/2021   End-stage  renal disease on hemodialysis ([place])    Acute kidney injury superimposed on CKD ([place]) 04/09/2021   ARF (acute renal failure) ([place]) 01/18/2021   Fall 01/17/2021   Essential hypertension    Hypothyroidism    Stroke (HCC)    GERD (gastroesophageal reflux disease)    Anemia in chronic kidney disease (CKD)    Acute metabolic encephalopathy    Acute renal failure superimposed on stage 3b chronic kidney disease (HCC)    Type 2 diabetes mellitus with ESRD (end-stage renal disease) (HCC)    Abnormal mammogram of left breast 07/30/2018   Closed displaced oblique fracture of shaft of left humerus 03/14/2018   Osteopenia 01/20/2018   Multiple thyroid nodules 07/13/2017   Tracheal deviation 07/13/2017   Malignant neoplasm of upper-outer quadrant of right breast in female, estrogen receptor positive ([place]) 01/20/2017   Primary vulvar squamous cell carcinoma ([place]) 01/20/2017    Palliative Care Assessment & Plan    Assessment/Recommendations/Plan  Mental status improving- continue current care- per Neurology hopeful for continued improvement  Poor po intake with hypoglycemic events- she had not yet been fed when I saw her at 10am- recommend feeding assistance with all meals and offer snacks in between meals- will change diet to soft diet and liberalize to encourage foods that she likes- strict I and O Baseline dementia- moderate- likely vascular given history of CVA and chronic ischemic disease on scans- discussed trajectory with Kirsten Mcmahon and encouraged him to discuss with patient's primary care MD- Dr. Bradd Mcmahon on discharge- also discussed that acute insults can worsen her baseline Disposition- recommend PT eval now that patient is more awake and interactive Will also benefit from outpatient Palliative- will discuss with Kirsten Mcmahon tomorrow    Code Status: Full code  Prognosis:  Unable to determine  Discharge Planning: To Be Determined  Care plan was discussed with son- Kirsten Mcmahon  Thank you for  allowing the Palliative Medicine Team to assist in the care of this patient.  Total time: 42 minutes Prolonged billing: no  Greater  than 50%  of this time was spent counseling and coordinating care related to the above assessment and plan.  Kirsten Mcmahon, AGNP-C Palliative Medicine   Please contact Palliative Medicine Team phone at [phone] for questions and concerns.

## 2021-07-24 ENCOUNTER — Other Ambulatory Visit: Payer: Self-pay

## 2021-07-24 DIAGNOSIS — N186 End stage renal disease: Secondary | ICD-10-CM

## 2021-07-24 DIAGNOSIS — R070 Pain in throat: Secondary | ICD-10-CM

## 2021-07-24 DIAGNOSIS — Z992 Dependence on renal dialysis: Secondary | ICD-10-CM

## 2021-07-24 LAB — CBC
HCT: 33.6 % — ABNORMAL LOW (ref 36.0–46.0)
Hemoglobin: 10.4 g/dL — ABNORMAL LOW (ref 12.0–15.0)
MCH: 29.1 pg (ref 26.0–34.0)
MCHC: 31 g/dL (ref 30.0–36.0)
MCV: 93.9 fL (ref 80.0–100.0)
Platelets: 146 10*3/uL — ABNORMAL LOW (ref 150–400)
RBC: 3.58 MIL/uL — ABNORMAL LOW (ref 3.87–5.11)
RDW: 15.7 % — ABNORMAL HIGH (ref 11.5–15.5)
WBC: 4.6 10*3/uL (ref 4.0–10.5)
nRBC: 0 % (ref 0.0–0.2)

## 2021-07-24 LAB — GLUCOSE, CAPILLARY
Glucose-Capillary: 92 mg/dL (ref 70–99)
Glucose-Capillary: 75 mg/dL (ref 70–99)
Glucose-Capillary: 97 mg/dL (ref 70–99)
Glucose-Capillary: 126 mg/dL — ABNORMAL HIGH (ref 70–99)

## 2021-07-24 LAB — RENAL FUNCTION PANEL
Albumin: 2.4 g/dL — ABNORMAL LOW (ref 3.5–5.0)
Anion gap: 8 (ref 5–15)
BUN: 17 mg/dL (ref 8–23)
CO2: 25 mmol/L (ref 22–32)
Calcium: 8.4 mg/dL — ABNORMAL LOW (ref 8.9–10.3)
Chloride: 99 mmol/L (ref 98–111)
Creatinine, Ser: 5.28 mg/dL — ABNORMAL HIGH (ref 0.44–1.00)
GFR, Estimated: 8 mL/min — ABNORMAL LOW (ref >60)
Glucose, Bld: 73 mg/dL (ref 70–99)
Phosphorus: 4.4 mg/dL (ref 2.5–4.6)
Potassium: 4 mmol/L (ref 3.5–5.1)
Sodium: 132 mmol/L — ABNORMAL LOW (ref 135–145)

## 2021-07-24 MED ORDER — CALCITRIOL 0.5 MCG PO CAPS
ORAL_CAPSULE | ORAL | Status: AC
  Filled 2021-07-24: qty 1

## 2021-07-24 MED ORDER — HEPARIN SODIUM (PORCINE) 1000 UNIT/ML IJ SOLN
INTRAMUSCULAR | Status: AC
  Administered 2021-07-24: 1000 [IU]
  Filled 2021-07-24: qty 4

## 2021-07-24 MED ORDER — OLANZAPINE 5 MG PO TBDP
2.5000 mg | ORAL_TABLET | Freq: Every day | ORAL | Status: DC
  Administered 2021-07-24 – 2021-07-27 (×4): 2.5 mg via ORAL
  Filled 2021-07-24 (×5): qty 0.5

## 2021-07-24 MED ORDER — HEPARIN SODIUM (PORCINE) 1000 UNIT/ML IJ SOLN
3800.0000 [IU] | Freq: Once | INTRAMUSCULAR | Status: AC
  Administered 2021-07-24: 3800 [IU] via INTRAVENOUS

## 2021-07-24 MED ORDER — MAGIC MOUTHWASH
5.0000 mL | Freq: Three times a day (TID) | ORAL | Status: DC
  Administered 2021-07-24 – 2021-07-31 (×17): 5 mL via ORAL
  Filled 2021-07-24 (×24): qty 5

## 2021-07-24 MED ORDER — HEPARIN BOLUS VIA INFUSION: 3800.0000 [IU] | Freq: Once | INTRAVENOUS | Status: DC

## 2021-07-24 NOTE — Progress Notes (Signed)
Daily Progress Note   Patient Name: Kirsten Mcmahon       Date: 07/24/2021 DOB: 10/17/1946  Age: 75 y.o. MRN#: [id number] Attending Physician: Bonnell Public, MD Primary Care Physician: [place] Admit Date: 07/15/2021  Reason for Consultation/Follow-up: Establishing goals of care  Patient Profile/HPI: 75 y.o. female  with past medical history of ESRD on HD, strep bacteremia, stroke, DM2, seizure disorder admitted on 07/15/2021 with seizure during dialysis- noted that patient had been off Keppra for several days prior to seizure. During admission she has been agitated at times requiring IV ativan which leaves her somnolent. Her seizure medications have also required adjustment. Having poor po intake with episodes of hypoglycemia. Palliative medicine consulted for goals of care discussion.   Subjective: Kirsten Mcmahon is awake. Her breakfast tray is at bedside, but she was at dialysis when it was delivered so no attempts to feed her have been made.  She was somewhat lethargic, her alertness wax and waned during my exam. I attempted to feed her some grits but she declined, saying- "don't stick that in my mouth".  I asked if her throat her and she nodded yes. She accepted ice chips. She did have a cough after one. She did not open her mouth wide enough for evaluation. She let me examine her neck and base of skull- no lymphadenopathy noted.  When I said goodbye she said "I love you" and allowed me to give her a hug.    Physical Exam Vitals and nursing note reviewed.            Vital Signs: BP 124/80 (BP Location: Left Arm)   Pulse 100   Temp 98 F (36.7 C) (Axillary)   Resp 20   Wt 54.3 kg   SpO2 100%   BMI 21.90 kg/m  SpO2: SpO2: 100 % O2 Device: O2 Device: Room Air O2  Flow Rate:    Intake/output summary:  Intake/Output Summary (Last 24 hours) at 07/24/2021 1220 Last data filed at 07/24/2021 1021 Gross per 24 hour  Intake 865.68 ml  Output 900 ml  Net -34.32 ml   LBM: Last BM Date:  (PTA) Baseline Weight: Weight: 55.3 kg Most recent weight: Weight: 54.3 kg       Palliative Assessment/Data: PPS: 20%      Patient Active  Problem List   Diagnosis Date Noted   Seizure ([place]) 07/17/2021   Mixed diabetic hyperlipidemia associated with type 2 diabetes mellitus ([place]) 07/16/2021   Breakthrough seizure ([place]) 07/15/2021   Recent cerebrovascular accident (CVA) 05/17/2021   History of CVA with residual deficit 05/17/2021   Cerebral embolism with cerebral infarction 05/13/2021   Physical deconditioning 04/30/2021   Generalized muscle weakness 04/30/2021   Pressure injury of skin 04/29/2021   AKI (acute kidney injury) ([place]) 04/28/2021   Streptococcal sepsis, unspecified ([place]) 04/18/2021   Streptococcal bacteremia 04/18/2021   Leukocytosis 04/17/2021   Fever 04/17/2021   End-stage renal disease on hemodialysis ([place])    Acute kidney injury superimposed on CKD ([place]) 04/09/2021   ARF (acute renal failure) ([place]) 01/18/2021   Fall 01/17/2021   Essential hypertension    Hypothyroidism    Stroke (HCC)    GERD (gastroesophageal reflux disease)    Anemia in chronic kidney disease (CKD)    Acute metabolic encephalopathy    Acute renal failure superimposed on stage 3b chronic kidney disease ([place])    Type 2 diabetes mellitus with ESRD (end-stage renal disease) ([place])    Abnormal mammogram of left breast 07/30/2018   Closed displaced oblique fracture of shaft of left humerus 03/14/2018   Osteopenia 01/20/2018   Multiple thyroid nodules 07/13/2017   Tracheal deviation 07/13/2017   Malignant neoplasm of upper-outer quadrant of right breast in female, estrogen receptor positive ([place]) 01/20/2017   Primary vulvar squamous cell carcinoma ([place]) 01/20/2017     Palliative Care Assessment & Plan    Assessment/Recommendations/Plan  Poor po intake- likely related to her altered mental status- she was in a much better state yesterday- it is possible she was fatigued having just returned from dialysis and not being fed prior to dialysis. Could also be related to hospital delirium- ordered Zyprexa 2.5mg  QHS for delirium in hopes this will help her mental status- it is possible that returning her to her more familiar home environment with her son will also help improve her mental status- although I am concerned this is her new baseline.  Throat pain- has not had recent antibiotics so unlikely thrush, her WBC is normal- magic mouthwash ordered for symptom control- will defer other interventions to primary team Coughing/dysphagia with po intake- SLP consult ordered Attempted to call her son, [name] - left a message   Code Status: Full code  Prognosis:  Unable to determine  Discharge Planning: Home with [facility] was discussed with patient's nurse.  Thank you for allowing the Palliative Medicine Team to assist in the care of this patient.  Total time: 68 mins Prolonged billing: yes     Greater than 50%  of this time was spent counseling and coordinating care related to the above assessment and plan.  Mariana Kaufman, AGNP-C Palliative Medicine   Please contact Palliative Medicine Team phone at [phone] for questions and concerns.

## 2021-07-24 NOTE — Progress Notes (Signed)
Patient son came to take her home AMA, MD notified and was aware. After speaking with patients son he has agreed for patient to stay until she is medical ready to be D/C home.

## 2021-07-24 NOTE — Progress Notes (Signed)
[facility] Progress Note   Subjective:     Seen on dialysis.  Remains confused.  Objective Vitals:   07/24/21 0930 07/24/21 1000 07/24/21 1005 07/24/21 1021  BP: 94/64 (!) 81/60 93/62 111/74  Pulse: 98 100 100 96  Resp: 18 19 19 20   Temp:    98 F (36.7 C)  TempSrc:    Oral  SpO2:    100%  Weight:    54.3 kg   Physical Exam General: frail-appearing, opens eyes to voice today Lungs: clear bilaterally Heart: RRR. No murmur, rubs or gallops.  Abdomen: soft, nontender, +BS Lower extremities:no edema  Neuro: opens eyes to voice today but doesn't speak Dialysis Access: [facility]  Filed Weights   07/22/21 1150 07/24/21 0710 07/24/21 1021  Weight: 54 kg 55.2 kg 54.3 kg    Intake/Output Summary (Last 24 hours) at 07/24/2021 1119 Last data filed at 07/24/2021 1021 Gross per 24 hour  Intake 865.68 ml  Output 900 ml  Net -34.32 ml     Additional Objective Labs: Basic Metabolic Panel: Recent Labs  Lab 07/20/21 0934 07/22/21 0934 07/24/21 0735  NA 133* 135 132*  K 3.9 4.1 4.0  CL 97* 100 99  CO2 27 25 25   GLUCOSE 94 69* 73  BUN 18 15 17   CREATININE 4.87* 4.77* 5.28*  CALCIUM 8.4* 8.5* 8.4*  PHOS 3.9 4.3 4.4   Liver Function Tests: Recent Labs  Lab 07/20/21 0934 07/22/21 0934 07/24/21 0735  ALBUMIN 2.6* 2.5* 2.4*   No results for input(s): LIPASE, AMYLASE in the last 168 hours. CBC: Recent Labs  Lab 07/20/21 0934 07/22/21 0934 07/24/21 0735  WBC 11.7* 5.6 4.6  HGB 11.3* 10.4* 10.4*  HCT 35.9* 34.7* 33.6*  MCV 93.5 94.8 93.9  PLT 241 190 146*   Blood Culture    Component Value Date/Time   SDES BLOOD LEFT FOREARM 05/17/2021 0953   SPECREQUEST  05/17/2021 0953    BOTTLES DRAWN AEROBIC AND ANAEROBIC Blood Culture adequate volume   CULT  05/17/2021 0953    NO GROWTH 5 DAYS Performed at [facility], [place] [address], [place], [place] [address]    REPTSTATUS 05/22/2021 FINAL 05/17/2021 0953    Cardiac Enzymes: No results for  input(s): CKTOTAL, CKMB, CKMBINDEX, TROPONINI in the last 168 hours. CBG: Recent Labs  Lab 07/23/21 0442 07/23/21 1129 07/23/21 1832 07/23/21 2135 07/24/21 0619  GLUCAP 102* 80 80 76 92   Iron Studies: No results for input(s): IRON, TIBC, TRANSFERRIN, FERRITIN in the last 72 hours. Lab Results  Component Value Date   INR 1.0 04/30/2021   Studies/Results: No results found.  Medications:  lacosamide (VIMPAT) IV Stopped (07/24/21 7322)   thiamine injection Stopped (07/23/21 1901)    (feeding supplement) PROSource Plus  30 mL Oral TID BM   aspirin  81 mg Oral Daily   calcitRIOL  0.5 mcg Oral Q M,W,F-HD   Chlorhexidine Gluconate Cloth  6 each Topical Q0600   heparin  5,000 Units Subcutaneous Q8H   heparin sodium (porcine)  3,800 Units Intravenous Once   latanoprost  1 drop Both Eyes QHS   letrozole  2.5 mg Oral Daily   levothyroxine  112 mcg Oral Q0600   metoprolol succinate  50 mg Oral Daily   [START ON 07/28/2021] thiamine injection  100 mg Intravenous Daily    Dialysis Orders: HD Orders: [name] MWF 3:45 hr 180 NRE 400/[place]of[place] 1.5 56 kg 3.0 K/2.5 Ca TDC  -No Heparin -Mircera 150 mcg IV  Q 2 weeks (last dose 07/15/2021) -Venofer 50 mg IV weekly (last dose 07/15/2021) -Calcitriol 0.5 mcg PO TIW  Assessment/Plan: Seizure-experienced a seizure during her last outpatient HD on 07/15/21.  Neurology following.  Was on depakote but more somnolent- now on vimpat and now MS is improving! Altered Mental Status-Currently sleeping. CT head (-) acute intracranial abnormalities x 2, last 07/20/21.  Neurology re-consulted.  Noted previous episodes of combative/agitation-this can negatively impact whether she will be able to continue outpatient HD.  MS improving marginally   ESRD - on HD MWF. Plan for HD 07/24/21 per usual schedule. UF as tolerated. Hypertension/volume  - Patient euvolemic on exam, Bps improving-continue HTN medication-monitor BP and HR. Anemia of CKD - Hgb 9.4-recently  received ESA and Fe in outpatient-monitor trends Secondary Hyperparathyroidism -  Ca 8.5-continue PO calcitriol.  Nutrition - Continue carb-modified renal diet. Albumin 2.6-continue protein supplements. Dispo: overall poor prognosis--> ? If HD is contributing to [place] Pgr [phone] 07/24/2021,11:19 AM  LOS: 7 days

## 2021-07-24 NOTE — Procedures (Signed)
Patient seen and examined on Hemodialysis. BP 111/74 (BP Location: Left Arm)   Pulse 96   Temp 98 F (36.7 C) (Oral)   Resp 20   Wt 54.3 kg   SpO2 100%   BMI 21.90 kg/m   QB 400 mL/ min via TDC, UF goal 1L  Tolerating treatment without complaints at this time.  She is calm and quiet   Madelon Lips MD [place] Pgr [phone] 11:21 AM

## 2021-07-24 NOTE — Progress Notes (Signed)
Patient back from treatment to 3w-19

## 2021-07-24 NOTE — Progress Notes (Signed)
PROGRESS NOTE    Kirsten Mcmahon  BPZ:[id number] DOB: January 31, 1946 DOA: 07/15/2021 PCP: [place]   Chief Complain:Seizure  Brief Narrative: Patient is a 75 year old female with history of ESRD on dialysis on Monday Wednesday Friday, history of breast cancer on letrozole, history of stroke, diabetes type 2, seizure disorder who was brought to the emergency department after she had seizure-like activity during hemodialysis.  As per the report, patient ran out of her [place] on 8/9.  Patient had 30 minutes left in her hemodialysis session, she started to have seizure-like activity.  Hemodialysis was stopped and she was brought to the emergency department.  She was postictal on presentation, lethargic, disoriented.  CT head did not show any acute intracranial normalities.  Neurology was consulted who recommended to continue Keppra 500 mg daily and 250 mg postdialysis initially but her hospital course was prolonged due to persistent encephalopathy, lethargic.  Keppra was changed to Depakote and now the Depakote has been changed to Vimpat with improvement in mental status.Nephrology also following for dialysis.  Hospital course also remarkable for persistent low blood glucose requiring D10 infusion.  Overall status has now improved, encephalopathy is resolving.  Plan to monitor for next 24 to 48 hours before safe discharge.  07/24/2021: Patient seen.  No significant history from patient.  Discussed with patient and son extensively.  Neurology input is appreciated.  Neurology team is directing patient's work-up.  Patient will be discharged once cleared for discharge by the neurology team.  Assessment & Plan:   Principal Problem:   Breakthrough seizure ([place]) Active Problems:   Essential hypertension   Hypothyroidism   Type 2 diabetes mellitus with ESRD (end-stage renal disease) ([place])   End-stage renal disease on hemodialysis ([place])   Mixed diabetic hyperlipidemia associated with type 2  diabetes mellitus ([place])   Seizure ([place])   Altered mental status/agitation: Hospital course was remarkable for persistent confusion, agitation.  Usually alert and awake  at baseline but she was on/off confused as per son. Unclear etiology for persistent encephalopathy.  Neurology recommended infectious work-up: Including UA, chest x-ray. CXR did not show PNA .She had mild leukocytosis, afebrile. MRI of the brain did not show any acute intracranial normalities but showed generalized atrophy, chronic small vessel ischemic changes. cEEG showed cortical dysfunction in the right central parietal region no seizures. We need to avoid sedatives, narcotics as much as possible. Seizure medication has been changed to Vimpat.  Finally she is more alert and awake now. .  1922: Patient seen.  No significant history from patient.  Neurology team is directing work-up.  Seizure: Had seizure activity while at dialysis.  She recently ran out of [place] on 8/9.  EEG showed cortical dysfunction in the right frontal region, moderate diffuse encephalopathy, no seizures or epileptiform discharges.  Currently on vimpat 07/24/2021: No further symptoms reported.  Hypoglycemia: Unknown etiology. Not on any meds at home. Hemoglobin A1c of 5.6 as per 6/22. She was on gentle D10 .  Now stopped.  Encourage oral intake 07/24/21: Blood sugar has ranged from 75 to 126.  Low threshold to initiate caloric count.  ESRD on dialysis: Has tunneled dialysis catheter on the right chest.  Nephrology consulted and following for dialysis.  History of hypertension: Continue current medication.  Monitor blood pressure  Hypothyroidism: Continue levothyroxine.TSH on 8/10 is normal  H/O stroke: She is weak on left side,walks with cane. As per son,she is usually alert and awake ,usually oriented but sometimes confused  History of breast  cancer: On remission.  On letrozole  Goals of care: Elderly patient with multiple comorbidities, on  dialysis, encephalopathic, poor oral intake.  Still a full code.  We consulted palliative care for goals of care discussion, plan is to continue full scope treatment.  Debility/deconditioning: PT/OT recommended SNF.  [facility]  so goal is to go home.TOC following   DVT prophylaxis:Heparin [place] Code Status: Full Family Communication: Discussed with son on phone on 07/23/21 on phone    Dispo: The patient is from: Home              Anticipated d/c is to: Home              Patient currently is not medically stable to d/c.   Difficult to place patient No  Plan to monitor for next 24 to 48 hours expecting mental status to return to baseline, stability on blood sugars   Consultants: neurology  Procedures:None  Antimicrobials:  Anti-infectives (From admission, onward)    None       Subjective: No significant history from patient.  Objective: Vitals:   07/24/21 1005 07/24/21 1021 07/24/21 1140 07/24/21 1528  BP: 93/62 111/74 124/80 121/84  Pulse: 100 96 100 97  Resp: 19 20 20 14   Temp:  98 F (36.7 C) 98 F (36.7 C) 98.6 F (37 C)  TempSrc:  Oral Axillary Oral  SpO2:  100%  100%  Weight:  54.3 kg      Intake/Output Summary (Last 24 hours) at 07/24/2021 1700 Last data filed at 07/24/2021 1021 Gross per 24 hour  Intake 865.68 ml  Output 900 ml  Net -34.32 ml     Filed Weights   07/22/21 1150 07/24/21 0710 07/24/21 1021  Weight: 54 kg 55.2 kg 54.3 kg    Examination:  General exam: Overall comfortable, not in distress.  No history from patient. HEENT: PERRL Respiratory system:  no wheezes or crackles  Cardiovascular system: S1 & S2 heard, RRR.  Tunneled dialysis cath on the right chest Gastrointestinal system: Abdomen is nondistended, soft and nontender. Central nervous system: Alert and awake, confused Extremities: Left upper extremity lymphedema , no clubbing ,no cyanosis Skin: No rashes, no ulcers,no icterus     Data Reviewed: I have  personally reviewed following labs and imaging studies  CBC: Recent Labs  Lab 07/20/21 0934 07/22/21 0934 07/24/21 0735  WBC 11.7* 5.6 4.6  HGB 11.3* 10.4* 10.4*  HCT 35.9* 34.7* 33.6*  MCV 93.5 94.8 93.9  PLT 241 190 146*    Basic Metabolic Panel: Recent Labs  Lab 07/18/21 0435 07/20/21 0934 07/22/21 0934 07/24/21 0735  NA  --  133* 135 132*  K  --  3.9 4.1 4.0  CL  --  97* 100 99  CO2  --  27 25 25   GLUCOSE  --  94 69* 73  BUN  --  18 15 17   CREATININE  --  4.87* 4.77* 5.28*  CALCIUM  --  8.4* 8.5* 8.4*  PHOS 3.8 3.9 4.3 4.4    GFR: Estimated Creatinine Clearance: 7.1 mL/min (A) (by C-G formula based on SCr of 5.28 mg/dL (H)). Liver Function Tests: Recent Labs  Lab 07/20/21 0934 07/22/21 0934 07/24/21 0735  ALBUMIN 2.6* 2.5* 2.4*    No results for input(s): LIPASE, AMYLASE in the last 168 hours. Recent Labs  Lab 07/21/21 1221  [place] 21    Coagulation Profile: No results for input(s): INR, PROTIME in the last 168 hours. Cardiac Enzymes:  No results for input(s): CKTOTAL, CKMB, CKMBINDEX, TROPONINI in the last 168 hours. BNP (last 3 results) No results for input(s): PROBNP in the last 8760 hours. HbA1C: No results for input(s): HGBA1C in the last 72 hours. CBG: Recent Labs  Lab 07/23/21 1832 07/23/21 2135 07/24/21 0619 07/24/21 1136 07/24/21 1641  GLUCAP 80 76 92 75 97    Lipid Profile: No results for input(s): CHOL, HDL, LDLCALC, TRIG, CHOLHDL, LDLDIRECT in the last 72 hours. Thyroid Function Tests: No results for input(s): TSH, T4TOTAL, FREET4, T3FREE, THYROIDAB in the last 72 hours.  Anemia Panel: No results for input(s): VITAMINB12, FOLATE, FERRITIN, TIBC, IRON, RETICCTPCT in the last 72 hours.  Sepsis Labs: No results for input(s): PROCALCITON, LATICACIDVEN in the last 168 hours.  Recent Results (from the past 240 hour(s))  Resp Panel by RT-PCR (Flu A&B, Covid) Nasopharyngeal Swab     Status: None   Collection Time: 07/15/21   5:50 PM   Specimen: Nasopharyngeal Swab; Nasopharyngeal(NP) swabs in vial transport medium  Result Value Ref Range Status   SARS Coronavirus 2 by RT PCR NEGATIVE NEGATIVE Final    Comment: (NOTE) SARS-CoV-2 target nucleic acids are NOT DETECTED.  The SARS-CoV-2 RNA is generally detectable in upper respiratory specimens during the acute phase of infection. The lowest concentration of SARS-CoV-2 viral copies this assay can detect is 138 copies/mL. A negative result does not preclude SARS-Cov-2 infection and should not be used as the sole basis for treatment or other patient management decisions. A negative result may occur with  improper specimen collection/handling, submission of specimen other than nasopharyngeal swab, presence of viral mutation(s) within the areas targeted by this assay, and inadequate number of viral copies(<138 copies/mL). A negative result must be combined with clinical observations, patient history, and epidemiological information. The expected result is Negative.  Fact Sheet for Patients:  EntrepreneurPulse.com.au  Fact Sheet for Healthcare Providers:  IncredibleEmployment.be  This test is no t yet approved or cleared by the [place] FDA and  has been authorized for detection and/or diagnosis of SARS-CoV-2 by FDA under an Emergency Use Authorization (EUA). This EUA will remain  in effect (meaning this test can be used) for the duration of the COVID-19 declaration under Section 564(b)(1) of the Act, 21 U.S.C.section 360bbb-3(b)(1), unless the authorization is terminated  or revoked sooner.       Influenza A by PCR NEGATIVE NEGATIVE Final   Influenza B by PCR NEGATIVE NEGATIVE Final    Comment: (NOTE) The Xpert Xpress SARS-CoV-2/FLU/RSV plus assay is intended as an aid in the diagnosis of influenza from Nasopharyngeal swab specimens and should not be used as a sole basis for treatment. Nasal washings and aspirates  are unacceptable for Xpert Xpress SARS-CoV-2/FLU/RSV testing.  Fact Sheet for Patients: EntrepreneurPulse.com.au  Fact Sheet for Healthcare Providers: IncredibleEmployment.be  This test is not yet approved or cleared by the [place] FDA and has been authorized for detection and/or diagnosis of SARS-CoV-2 by FDA under an Emergency Use Authorization (EUA). This EUA will remain in effect (meaning this test can be used) for the duration of the COVID-19 declaration under Section 564(b)(1) of the Act, 21 U.S.C. section 360bbb-3(b)(1), unless the authorization is terminated or revoked.  Performed at [facility], [place] [address] [place], [place] [address]           Radiology Studies: No results found.      Scheduled Meds:  (feeding supplement) PROSource Plus  30 mL Oral TID BM   aspirin  81 mg Oral Daily   calcitRIOL  0.5 mcg Oral Q M,W,F-HD   Chlorhexidine Gluconate Cloth  6 each Topical Q0600   heparin  5,000 Units Subcutaneous Q8H   latanoprost  1 drop Both Eyes QHS   letrozole  2.5 mg Oral Daily   levothyroxine  112 mcg Oral Q0600   magic mouthwash  5 mL Oral TID   metoprolol succinate  50 mg Oral Daily   OLANZapine zydis  2.5 mg Oral QHS   [START ON 07/28/2021] thiamine injection  100 mg Intravenous Daily   Continuous Infusions:  lacosamide (VIMPAT) IV 100 mg (07/24/21 1129)   thiamine injection 250 mg (07/24/21 1219)     LOS: 7 days    Time spent: 25 mins.More than 50% of that time was spent in counseling and/or coordination of care.   Bonnell Public, MD [facility] P8/19/2022, 5:00 PM

## 2021-07-24 NOTE — Progress Notes (Addendum)
Neurology Progress Note  S: Does not participate in ROS due to altered mental status. Per RN, she slapped her Synthroid away this morning and is not eating and drinking well. She has had some issues with hypoglycemia but last 3 sugars are 80, 76, and 92.   O: Current vital signs: BP (!) 147/97 (BP Location: Left Arm)   Pulse 89   Temp 97.7 F (36.5 C)   Resp 19   Wt 54 kg   SpO2 100%   BMI 21.77 kg/m  Vital signs in last 24 hours: Temp:  [97.7 F (36.5 C)-98.2 F (36.8 C)] 97.7 F (36.5 C) (08/19 0439) Pulse Rate:  [80-94] 89 (08/19 0439) Resp:  [18-20] 19 (08/19 0439) BP: (112-147)/(77-97) 147/97 (08/19 0439) SpO2:  [100 %] 100 % (08/19 0439)  GENERAL: Poor appearing, chronically ill appearing female lying in bed in HD. Awake, alert in NAD. HEENT: Normocephalic and atraumatic. LUNGS: Normal respiratory effort.  CV: RRR. Ext: warm.   NEURO:  Mental Status: Alert . Will say her name and that is all. She follows one command to squeeze NP's hand with her right hand. No other commands. No other speech, even with asking several questions. Speech/Language: speech is slowed without aphasia or dysarthria.  Naming, repetition not intact. Name is fluent. Bradyphrenic.   Cranial Nerves:  PERRL 72mm. Eyes conjugate with slight upward gaze. Arcus senilis. Will track examiner from one side of bed to the other. Eyelids elevated symmetrically. Face is symmetrical at rest. Hearing intact to voice. Right grip 4/5. No participation for other parts of strength exam.   Medications  Current Facility-Administered Medications:    (feeding supplement) PROSource Plus liquid 30 mL, 30 mL, Oral, TID BM, Tobie Poet E, NP, 30 mL at 07/23/21 2128   acetaminophen (TYLENOL) tablet 650 mg, 650 mg, Oral, Q6H PRN, 650 mg at 07/18/21 1816 **OR** acetaminophen (TYLENOL) suppository 650 mg, 650 mg, Rectal, Q6H PRN, Shalhoub, Sherryll Burger, MD   aspirin chewable tablet 81 mg, 81 mg, Oral, Daily, Adhikari,  Amrit, MD, 81 mg at 07/23/21 1236   calcitRIOL (ROCALTROL) capsule 0.5 mcg, 0.5 mcg, Oral, Q M,W,F-HD, Valentina Gu, NP, 0.5 mcg at 07/22/21 1411   Chlorhexidine Gluconate Cloth 2 % PADS 6 each, 6 each, Topical, Q0600, Valentina Gu, NP, 6 each at 07/24/21 0656   heparin injection 5,000 Units, 5,000 Units, Subcutaneous, Q8H, Shalhoub, Sherryll Burger, MD, 5,000 Units at 07/24/21 0656   labetalol (NORMODYNE) injection 10 mg, 10 mg, Intravenous, Q2H PRN, Adhikari, Amrit, MD   lacosamide (VIMPAT) 100 mg in sodium chloride 0.9 % 25 mL IVPB, 100 mg, Intravenous, Q12H, Greta Doom, MD, Stopped at 07/24/21 0659   latanoprost (XALATAN) 0.005 % ophthalmic solution 1 drop, 1 drop, Both Eyes, QHS, Shalhoub, Sherryll Burger, MD, 1 drop at 07/23/21 2128   letrozole [facility]) tablet 2.5 mg, 2.5 mg, Oral, Daily, Shalhoub, Sherryll Burger, MD, 2.5 mg at 07/23/21 1236   levothyroxine (SYNTHROID) tablet 112 mcg, 112 mcg, Oral, Q0600, Vernelle Emerald, MD, 112 mcg at 07/23/21 0639   metoprolol succinate (TOPROL-XL) 24 hr tablet 50 mg, 50 mg, Oral, Daily, Shalhoub, Sherryll Burger, MD, 50 mg at 07/23/21 1236   ondansetron (ZOFRAN) tablet 4 mg, 4 mg, Oral, Q6H PRN **OR** ondansetron (ZOFRAN) injection 4 mg, 4 mg, Intravenous, Q6H PRN, Shalhoub, Sherryll Burger, MD, 4 mg at 07/17/21 2025   polyethylene glycol (MIRALAX / GLYCOLAX) packet 17 g, 17 g, Oral, Daily PRN, Shalhoub, Sherryll Burger, MD   Margrett Rud  thiamine 500mg  in normal saline (37ml) IVPB, 500 mg, Intravenous, Q8H, Last Rate: 100 mL/hr at 07/22/21 3612, 500 mg at 07/22/21 2449 **FOLLOWED BY** thiamine (B-1) 250 mg in sodium chloride 0.9 % 50 mL IVPB, 250 mg, Intravenous, Daily, Stopping Infusion hung by another clincian at 07/23/21 1901 **FOLLOWED BY** [START ON 07/28/2021] thiamine (B-1) injection 100 mg, 100 mg, Intravenous, Daily, Kirby-Graham, Karsten Fells, NP  Pertinent Labs VA level 71 07/21/21.   No new imaging since 07/22/21.   Assessment:  75 yo female on Hospital Day  #8 for altered mental status in face of dementia, delirium, seizure, and old stroke. CT and MRI head showed no reversible causes for her AMS. Her presentation continues to be likely secondary to toxic metabolic encephalopathy and delirium, as no reversible cause has been identified. In review of notes, it would appear that her mental status exam waxes and wanes daily.   Impression:  -Multifactorial toxic/metabolic encephalopathy.  -Delirium.  -Seizure this hospitalization.  -hypoglycemia.  -poor po intake.   Recommendations/Plan: -Continue supportive care with correction of toxic and metabolic derangements as you are doing.  -Will need nutrition addressed with refusal of medications.  -Continue delirium precautions.  -Continue seizure precautions.  -Ativan 2mg  IV for seizure over 5 mins and call neurology.  -Continue Vimpat 100mg  IV q12 hours.  -Continue high dose Thiamine.  -Continue to avoid sedative medications and those on the Beer's List for the elderly.  -Agree with palliative care consult.   Pt seen by Clance Boll, MSN, APN-BC/Nurse Practitioner/Neuro and later by MD. Note and plan to be edited as needed by MD.  Pager: [phone]  I have seen the patient and reviewed the above note.  I saw her later in the afternoon, and she was significantly more awake than she was earlier in the day.  She still is not nearly as good as she was yesterday, which would argue against Depakote as being the culprit.  I suspect that this is a waxing and waning delirium.  Neurology will continue to follow.  Roland Rack, MD [facility] [phone]  If 7pm- 7am, please page neurology on call as listed in [place].

## 2021-07-24 NOTE — Progress Notes (Signed)
Patient off floor for treatment  

## 2021-07-24 NOTE — Evaluation (Addendum)
Clinical/Bedside Swallow Evaluation Patient Details  Name: Kirsten Mcmahon MRN: [id number] Date of Birth: [birth year]  Today's Date: 07/24/2021 Time: SLP Start Time (ACUTE ONLY): 1455 SLP Stop Time (ACUTE ONLY): 1510 SLP Time Calculation (min) (ACUTE ONLY): 15 min  Past Medical History:  Past Medical History:  Diagnosis Date   Cancer ([place]) 2017   Right breast   Depression    GERD (gastroesophageal reflux disease)    Glaucoma    Hemiparesis (HCC)    left side   High cholesterol    History of seizure    x 1 - after a spider bite   History of stroke with residual deficit    left-side weakness   Hypertension    states BP under control with meds., has been on med. x 2 yr.   Hypothyroidism    Non-insulin dependent type 2 diabetes mellitus ([place])    Overactive bladder    Stroke ([place])    1998 weakness on left side   Past Surgical History:  Past Surgical History:  Procedure Laterality Date   ABDOMINAL HYSTERECTOMY     complete   BREAST BIOPSY Left 08/03/2018   Benign adipose tissue   BREAST EXCISIONAL BIOPSY Right 2014   Positive   BREAST LUMPECTOMY Right    BUBBLE STUDY  05/15/2021   Procedure: BUBBLE STUDY;  Surgeon: Jerline Pain, MD;  Location: [place] [facility];  Service: Cardiovascular;;   CATARACT EXTRACTION W/ INTRAOCULAR LENS IMPLANT Left    CEREBRAL ANEURYSM REPAIR  1998   DIALYSIS/PERMA CATHETER INSERTION N/A 04/10/2021   Procedure: DIALYSIS/PERMA CATHETER INSERTION;  Surgeon: Algernon Huxley, MD;  Location: [facility] CV LAB;  Service: Cardiovascular;  Laterality: N/A;   IR FLUORO GUIDE CV LINE RIGHT  04/30/2021   PICC LINE INSERTION     TEE WITHOUT CARDIOVERSION N/A 05/15/2021   Procedure: TRANSESOPHAGEAL ECHOCARDIOGRAM (TEE);  Surgeon: Jerline Pain, MD;  Location: [facility]COPY;  Service: Cardiovascular;  Laterality: N/A;   THYROID LOBECTOMY Right 07/15/2017   Procedure: RIGHT THYROID LOBECTOMY;  Surgeon: Armandina Gemma, MD;  Location: [facility];  Service: General;  Laterality:  Right;   HPI:  75 y.o. female presenting from dialysis to ED 8/10 with breakthrough seizures after missed dose of keppra. Dx multifactorial toxic/metabolic encephalopathy, delirium. Patient from home with PACE. Admission complicated by AMS, confusion and agitation.  She has had poor PO intake since admission. PMHx significant for HTN, HLD, DMII, stroke with residual left-sided weakness and extensor tone,dementia, wheelchair-bound, GERD, hypothyroidism, depression, breast cancer, CKD stage IIIb on HD. Followed by SLP during prior admission with MBS on 05/22/21: mild dysphagia c/b impaired bolus control, transient penetration (PAS2). Dys3/thin liquid was recommended.  Palliative Care is following and ordered SLP swallow eval on 8/19 due to observed coughing with PO intake.   Assessment / Plan / Recommendation Clinical Impression  Pt lethargic, and participation in clinical swallow evaluation was limited. She did not respond to examiner's questions nor follow commands. She smiled intermittently but did not verbalize throughout session. She did not allow oral mechanism exam and kept lips sealed. She accepted ice chips and minimal sips of thin liquid with oral holding and eventual swallow response.  She did not accept offering of yogurt. There were no overt concerns for aspiration with the marginal amounts of liquids/ice she consumed.    Recommend continuing efforts to support PO intake when she is alert enough to eat. Provide oral care before assisting her with meals.  SLP will follow briefly for further assessment  as mental status allows. Continue current diet to optimize choices. SLP Visit Diagnosis: Dysphagia, unspecified (R13.10)    Aspiration Risk    tba   Diet Recommendation   Dysphagia 3, thin liquids  Medication Administration: Whole meds with puree    Other  Recommendations Oral Care Recommendations: Oral care BID   Follow up Recommendations Other (comment) (tba)      Frequency and  Duration min 2x/week  1 week       Prognosis Prognosis for Safe Diet Advancement: Fair      Swallow Study   General HPI: 75 y.o. female presenting from dialysis to ED 8/10 with breakthrough seizures after missed dose of keppra. Dx multifactorial toxic/metabolic encephalopathy, delirium. Patient from home with PACE. Admission complicated by AMS, confusion and agitation.  She has had poor PO intake since admission. PMHx significant for HTN, HLD, DMII, stroke with residual left-sided weakness and extensor tone,dementia, wheelchair-bound, GERD, hypothyroidism, depression, breast cancer, CKD stage IIIb on HD. Followed by SLP during prior admission with MBS on 05/22/21: mild dysphagia c/b impaired bolus control, transient penetration (PAS2). Dys3/thin liquid was recommended.  Palliative Care is following and ordered SLP swallow eval on 8/19 due to observed coughing with PO intake. Type of Study: Bedside Swallow Evaluation Previous Swallow Assessment: see HPI Diet Prior to this Study: Dysphagia 3 (soft);Thin liquids Temperature Spikes Noted: No Respiratory Status: Room air History of Recent Intubation: No Behavior/Cognition: Lethargic/Drowsy Oral Cavity Assessment: Other (comment) (unable to examine) Oral Care Completed by SLP: No Self-Feeding Abilities: Needs assist Patient Positioning: Upright in bed Baseline Vocal Quality: Low vocal intensity Volitional Cough: Cognitively unable to elicit Volitional Swallow: Unable to elicit    Oral/Motor/Sensory Function Overall Oral Motor/Sensory Function: Other (comment) (symmetry at base)   Ice Chips Ice chips: Within functional limits Presentation: Spoon   Thin Liquid Thin Liquid: Impaired Presentation: Cup Oral Phase Functional Implications: Prolonged oral transit    Nectar Thick Nectar Thick Liquid: Not tested   Honey Thick Honey Thick Liquid: Not tested   Puree Puree: Not tested   Solid     Solid: Not tested     Estill Bamberg L. Tivis Ringer, [place] Office number [phone] Pager [phone]  Juan Quam Laurice 07/24/2021,3:20 PM

## 2021-07-24 NOTE — Progress Notes (Signed)
If patient leaves over weekend [facility] # [phone] to arrange transport and HH needs

## 2021-07-25 ENCOUNTER — Inpatient Hospital Stay (HOSPITAL_COMMUNITY): Payer: Medicare (Managed Care)

## 2021-07-25 LAB — GLUCOSE, CAPILLARY
Glucose-Capillary: 71 mg/dL (ref 70–99)
Glucose-Capillary: 81 mg/dL (ref 70–99)
Glucose-Capillary: 52 mg/dL — ABNORMAL LOW (ref 70–99)
Glucose-Capillary: 59 mg/dL — ABNORMAL LOW (ref 70–99)
Glucose-Capillary: 87 mg/dL (ref 70–99)
Glucose-Capillary: 125 mg/dL — ABNORMAL HIGH (ref 70–99)

## 2021-07-25 IMAGING — DX DG CHEST 1V PORT
1 series · 1 of 1 positions shown · non-contrast
Comparison: [DATE] and older studies.

CLINICAL DATA: Altered mental status.

EXAM:
PORTABLE CHEST 1 VIEW

[chest ap]
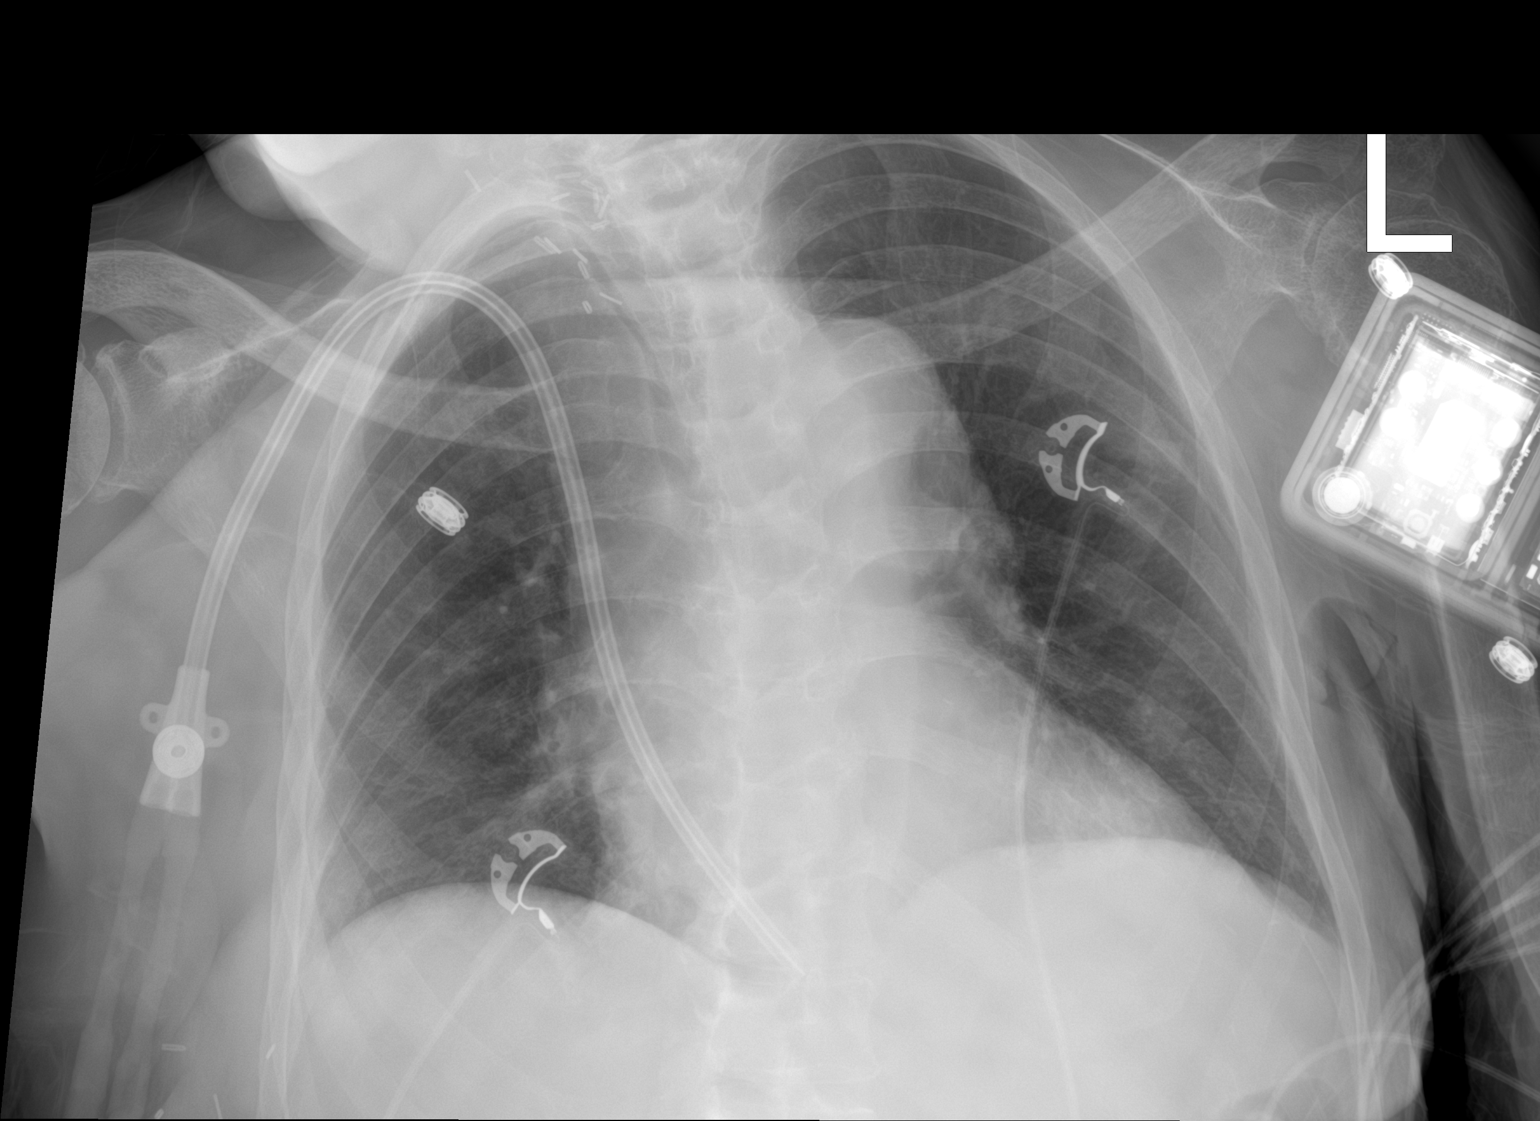

[1 of 1 positions shown; findings below may reference images not displayed]

FINDINGS: Cardiac silhouette normal in size. No mediastinal or hilar masses.
Stable right internal jugular tunneled dual lumen central venous
catheter, tip in the right atrium.

Clear lungs.  No pleural effusion or pneumothorax.

Skeletal structures grossly intact.
IMPRESSION: No acute cardiopulmonary disease.

## 2021-07-25 MED ORDER — GLUCAGON HCL RDNA (DIAGNOSTIC) 1 MG IJ SOLR
INTRAMUSCULAR | Status: AC
  Filled 2021-07-25: qty 1

## 2021-07-25 MED ORDER — GLUCAGON HCL RDNA (DIAGNOSTIC) 1 MG IJ SOLR
1.0000 mg | INTRAMUSCULAR | Status: AC
  Administered 2021-07-25: 1 mg via INTRAMUSCULAR
  Filled 2021-07-25: qty 1

## 2021-07-25 MED ORDER — GLUCAGON HCL RDNA (DIAGNOSTIC) 1 MG IJ SOLR: 1.0000 mg | INTRAMUSCULAR | Status: AC

## 2021-07-25 NOTE — Progress Notes (Signed)
Spoke with Caryl Pina, RN for patient. Order was placed by Jennelle Human, NP on nephrologist behalf for a midline placement. CrCl is 10. Educated Therapist, sports an order from .nephrology is needed. RN verbalized she would clarify and place a consult if/when new orders are placed.

## 2021-07-25 NOTE — Progress Notes (Signed)
Daily Progress Note   Patient Name: Kirsten Mcmahon       Date: 07/25/2021 DOB: 1946-01-27  Age: 75 y.o. MRN#: [id number] Attending Physician: Bonnell Public, MD Primary Care Physician: [place] Admit Date: 07/15/2021  Reason for Consultation/Follow-up: Establishing goals of care  Patient Profile/HPI:  75 y.o. female  with past medical history of ESRD on HD, strep bacteremia, stroke, DM2, seizure disorder admitted on 07/15/2021 with seizure during dialysis- noted that patient had been off Keppra for several days prior to seizure. During admission she has been agitated at times requiring IV ativan which leaves her somnolent. Her seizure medications have also required adjustment. Having poor po intake with episodes of hypoglycemia. Palliative medicine consulted for goals of care discussion.  Subjective: Kirsten Mcmahon is awake, but not interactive. IV nursing at bedside. Noted she ate 70% of dinner and 30% of breakfast. Refused lunch.  Her son was present last night and made efforts to d/c her AMA, agreed to keep patient inpatient with discussion with attending.  Kirsten Mcmahon has not returned my call.   Review of Systems  Unable to perform ROS: Mental status change    Physical Exam Vitals and nursing note reviewed.  Pulmonary:     Effort: Pulmonary effort is normal.  Neurological:     Mental Status: She is alert.     Comments: Non interactive            Vital Signs: BP 128/74 (BP Location: Right Arm)   Pulse 90   Temp 98 F (36.7 C) (Oral)   Resp 12   Wt 54.3 kg   SpO2 96%   BMI 21.90 kg/m  SpO2: SpO2: 96 % O2 Device: O2 Device: Room Air O2 Flow Rate:    Intake/output summary:  Intake/Output Summary (Last 24 hours) at 07/25/2021 1606 Last data filed at 07/25/2021  0900 Gross per 24 hour  Intake 30 ml  Output --  Net 30 ml   LBM: Last BM Date:  (PTA) Baseline Weight: Weight: 55.3 kg Most recent weight: Weight: 54.3 kg       Palliative Assessment/Data: PPS: 30%      Patient Active Problem List   Diagnosis Date Noted   Seizure ([place]) 07/17/2021   Mixed diabetic hyperlipidemia associated with type 2 diabetes mellitus ([place]) 07/16/2021   Breakthrough  seizure ([place]) 07/15/2021   Recent cerebrovascular accident (CVA) 05/17/2021   History of CVA with residual deficit 05/17/2021   Cerebral embolism with cerebral infarction 05/13/2021   Physical deconditioning 04/30/2021   Generalized muscle weakness 04/30/2021   Pressure injury of skin 04/29/2021   AKI (acute kidney injury) ([place]) 04/28/2021   Streptococcal sepsis, unspecified ([place]) 04/18/2021   Streptococcal bacteremia 04/18/2021   Leukocytosis 04/17/2021   Fever 04/17/2021   End-stage renal disease on hemodialysis [facility])    Acute kidney injury superimposed on CKD ([place]) 04/09/2021   ARF (acute renal failure) ([place]) 01/18/2021   Fall 01/17/2021   Essential hypertension    Hypothyroidism    Stroke (HCC)    GERD (gastroesophageal reflux disease)    Anemia in chronic kidney disease (CKD)    Acute metabolic encephalopathy    Acute renal failure superimposed on stage 3b chronic kidney disease (HCC)    Type 2 diabetes mellitus with ESRD (end-stage renal disease) ([place])    Abnormal mammogram of left breast 07/30/2018   Closed displaced oblique fracture of shaft of left humerus 03/14/2018   Osteopenia 01/20/2018   Multiple thyroid nodules 07/13/2017   Tracheal deviation 07/13/2017   Malignant neoplasm of upper-outer quadrant of right breast in female, estrogen receptor positive ([place]) 01/20/2017   Primary vulvar squamous cell carcinoma ([place]) 01/20/2017    Palliative Care Assessment & Plan    Assessment/Recommendations/Plan  Continue current plan Patient's son is no longer returning  Palliative calls- he has been clear in his [place] for full scope and full code- given that he attempted to take patient home AMA last night, I am doubtful he will desire further [place] discussion at this time Continue low dose olanzapine for hospital delirium- agree this may take extended amount of time to resolve and she very well may have better likelihood of improving more if she is in her familiar home environment- she is involved with PACE and SNF level of care in the home is available to her- recommend all efforts made to d/c her home as soon as possible as this may be the best way to help improve her status PMT will check patient's chart intermittently and re-engage if patient's son returns call   Code Status: Full code  Prognosis:  Unable to determine  Discharge Planning: To Be Determined   Thank you for allowing the Palliative Medicine Team to assist in the care of this patient.  Total time: 28 mins     Greater than 50%  of this time was spent counseling and coordinating care related to the above assessment and plan.  Mariana Kaufman, AGNP-C Palliative Medicine   Please contact Palliative Medicine Team phone at [phone] for questions and concerns.

## 2021-07-25 NOTE — Progress Notes (Addendum)
Neurology Progress Note  S: Patient is unable to participate in ROS due to altered mental status. Per Nurse Tech in room, patient is not taking po well.   O: Current vital signs: BP 125/66 (BP Location: Right Arm)   Pulse 88   Temp 98.2 F (36.8 C) (Oral)   Resp 15   Wt 54.3 kg   SpO2 100%   BMI 21.90 kg/m  Vital signs in last 24 hours: Temp:  [98 F (36.7 C)-98.6 F (37 C)] 98.2 F (36.8 C) (08/20 0823) Pulse Rate:  [88-114] 88 (08/20 0823) Resp:  [14-20] 15 (08/20 0823) BP: (111-151)/(66-98) 125/66 (08/20 0823) SpO2:  [100 %] 100 % (08/20 0823) Weight:  [54.3 kg] 54.3 kg (08/19 1021)  GENERAL: Poor, chronically ill appearing. Awake, alert in NAD. HEENT: Normocephalic and atraumatic. LUNGS: Normal respiratory effort.  CV: RRR. Ext: warm.   NEURO:  Mental Status: Alert. Tells NP her name. No other speech. Squeezes NP hand with right hand. Does not follow other commands.   Speech/Language: speech is without aphasia or dysarthria with stating of name.  Bradyphrenia. Unable to name or repeat.   Cranial Nerves:  PERRL. Eyelids elevate symmetrically. Face is symmetrical at rest. Hearing intact to voice. RUE grip 4/5. Withdraws RLE to noxious stimuli and says "quit". Moves LLE with plantar stimulation. Able to track examiner.  Gait- deferred.  Medications  Current Facility-Administered Medications:    (feeding supplement) PROSource Plus liquid 30 mL, 30 mL, Oral, TID BM, Tobie Poet E, NP, 30 mL at 07/24/21 1441   acetaminophen (TYLENOL) tablet 650 mg, 650 mg, Oral, Q6H PRN, 650 mg at 07/18/21 1816 **OR** acetaminophen (TYLENOL) suppository 650 mg, 650 mg, Rectal, Q6H PRN, Shalhoub, Sherryll Burger, MD   aspirin chewable tablet 81 mg, 81 mg, Oral, Daily, Adhikari, Amrit, MD, 81 mg at 07/24/21 1125   calcitRIOL (ROCALTROL) capsule 0.5 mcg, 0.5 mcg, Oral, Q M,W,F-HD, Valentina Gu, NP, 0.5 mcg at 07/22/21 1411   Chlorhexidine Gluconate Cloth 2 % PADS 6 each, 6 each,  Topical, Q0600, Valentina Gu, NP, 6 each at 07/25/21 [phone]   heparin injection 5,000 Units, 5,000 Units, Subcutaneous, Q8H, Shalhoub, Sherryll Burger, MD, 5,000 Units at 07/25/21 1583   labetalol (NORMODYNE) injection 10 mg, 10 mg, Intravenous, Q2H PRN, Tawanna Solo, Amrit, MD   lacosamide (VIMPAT) 100 mg in sodium chloride 0.9 % 25 mL IVPB, 100 mg, Intravenous, Q12H, Greta Doom, MD, Last Rate: 70 mL/hr at 07/25/21 1002, 100 mg at 07/25/21 1002   latanoprost (XALATAN) 0.005 % ophthalmic solution 1 drop, 1 drop, Both Eyes, QHS, Shalhoub, Sherryll Burger, MD, 1 drop at 07/24/21 2209   letrozole [facility]) tablet 2.5 mg, 2.5 mg, Oral, Daily, Shalhoub, Sherryll Burger, MD, 2.5 mg at 07/24/21 1124   levothyroxine (SYNTHROID) tablet 112 mcg, 112 mcg, Oral, Q0600, Vernelle Emerald, MD, 112 mcg at 07/25/21 0940   magic mouthwash, 5 mL, Oral, TID, Juanda Crumble, Kasie J, NP, 5 mL at 07/24/21 2217   metoprolol succinate (TOPROL-XL) 24 hr tablet 50 mg, 50 mg, Oral, Daily, Shalhoub, Sherryll Burger, MD, 50 mg at 07/24/21 1134   OLANZapine zydis (ZYPREXA) disintegrating tablet 2.5 mg, 2.5 mg, Oral, QHS, Mahan, Kasie J, NP, 2.5 mg at 07/24/21 2217   ondansetron (ZOFRAN) tablet 4 mg, 4 mg, Oral, Q6H PRN **OR** ondansetron (ZOFRAN) injection 4 mg, 4 mg, Intravenous, Q6H PRN, Shalhoub, Sherryll Burger, MD, 4 mg at 07/17/21 2025   polyethylene glycol (MIRALAX / GLYCOLAX) packet 17 g, 17 g, Oral,  Daily PRN, Shalhoub, Sherryll Burger, MD   [COMPLETED] thiamine 500mg  in normal saline (6ml) IVPB, 500 mg, Intravenous, Q8H, Last Rate: 100 mL/hr at 07/22/21 0621, 500 mg at 07/22/21 1497 **FOLLOWED BY** thiamine (B-1) 250 mg in sodium chloride 0.9 % 50 mL IVPB, 250 mg, Intravenous, Daily, Last Rate: 100 mL/hr at 07/24/21 1219, 250 mg at 07/24/21 1219 **FOLLOWED BY** [START ON 07/28/2021] thiamine (B-1) injection 100 mg, 100 mg, Intravenous, Daily, Kirby-Graham, Karsten Fells, NP  No new imaging.   Assessment: 75 yo female on Hospital Day #9 for altered mental status  in face of dementia, delirium, seizure, and old stroke. Imaging and other testing has not revealed reversible cause for her encephalopathy. Her presentation continues to be likely secondary to toxic metabolic encephalopathy and delirium which waxes and wanes. It may takes weeks to months for delirium to clear.   Impression: -Multifactorial toxic/metabolic encephalopathy.  -Delirium.  -Seizure this hospitalization.     Recommendations/Plan: -Nutrition per medical team.  -Continue delirium precautions.  -Continue seizure precautions.  -Ativan 2mg  IV for seizure over 5 mins and call neurology.  -Continue Vimpat 100mg  IV q12 hours.  -Continue to avoid sedative medications and those on the Beer's List for the elderly.  -Agree with palliative care consult.    Pt seen by Clance Boll, MSN, APN-BC/Nurse Practitioner/Neuro and discussed with Dr. Leonel Ramsay who approves.   Pager: [phone]  I have seen the patient reviewed the above notes.  I am not sure why her mental status is worsening again, I think it would be worthwhile to recheck chest x-ray, blood cultures, UA(if she makes any urine).   Neurological continue to follow.  Roland Rack, MD [facility] [phone]  If 7pm- 7am, please page neurology on call as listed in [place].

## 2021-07-25 NOTE — Progress Notes (Addendum)
PCCM consulted for central line placement given no current IV access and ongoing hypoglycemia, not able to take POs currently.  Last CBG 87 after glucagon. Plans to place NGT. Additionally needs IV access for AEDs.  Chart reviewed.  No PIV able to be placed by IV team.  Currently has a TDC for iHD.  Spoke with Dr. Carolin Sicks with Nephrology who recommends mildine placement via IV team.    If IV team not able to place midline, please contact PCCM and we will reassess for CVL placement.   Addendum 1913: evaluated patient at bedside with IV team.  Thankfully, IV team was able to place a midline catheter.  Son at bedside and grateful the care patient is receiving.

## 2021-07-25 NOTE — Progress Notes (Signed)
Assessed patient x 3  VAST RN's for PIV access per consult. Ultrasound used on bilateral upper extremities with no suitable vasculature. Very poor access. Reported findings to the primary RN, Caryl Pina who will reach out to nephrology for potential further orders/recommendations. RN to re-consult if needed.

## 2021-07-25 NOTE — Progress Notes (Signed)
[facility] Progress Note   Subjective:     Seen in room.  Pt's son tried to take her home AMA yesterday but has agreed to stay until medically ready.  Objective Vitals:   07/24/21 2033 07/24/21 2334 07/25/21 0348 07/25/21 0823  BP: (!) 150/98 (!) 151/92 (!) 143/88 125/66  Pulse: 91 98 (!) 114 88  Resp: 16 16 16 15   Temp: 98.5 F (36.9 C) 98.5 F (36.9 C) 98.3 F (36.8 C) 98.2 F (36.8 C)  TempSrc: Oral Oral Oral Oral  SpO2: 100% 100% 100% 100%  Weight:       Physical Exam General: frail-appearing, sleeping, doesn't open eyes Lungs: clear bilaterally Heart: RRR. No murmur, rubs or gallops.  Abdomen: soft, nontender, +BS Lower extremities:no edema  Neuro: sleeping, doesn't arouse today Dialysis Access: [facility]  Filed Weights   07/22/21 1150 07/24/21 0710 07/24/21 1021  Weight: 54 kg 55.2 kg 54.3 kg    Intake/Output Summary (Last 24 hours) at 07/25/2021 0952 Last data filed at 07/24/2021 1021 Gross per 24 hour  Intake --  Output 900 ml  Net -900 ml     Additional Objective Labs: Basic Metabolic Panel: Recent Labs  Lab 07/20/21 0934 07/22/21 0934 07/24/21 0735  NA 133* 135 132*  K 3.9 4.1 4.0  CL 97* 100 99  CO2 27 25 25   GLUCOSE 94 69* 73  BUN 18 15 17   CREATININE 4.87* 4.77* 5.28*  CALCIUM 8.4* 8.5* 8.4*  PHOS 3.9 4.3 4.4   Liver Function Tests: Recent Labs  Lab 07/20/21 0934 07/22/21 0934 07/24/21 0735  ALBUMIN 2.6* 2.5* 2.4*   No results for input(s): LIPASE, AMYLASE in the last 168 hours. CBC: Recent Labs  Lab 07/20/21 0934 07/22/21 0934 07/24/21 0735  WBC 11.7* 5.6 4.6  HGB 11.3* 10.4* 10.4*  HCT 35.9* 34.7* 33.6*  MCV 93.5 94.8 93.9  PLT 241 190 146*   Blood Culture    Component Value Date/Time   SDES BLOOD LEFT FOREARM 05/17/2021 0953   SPECREQUEST  05/17/2021 0953    BOTTLES DRAWN AEROBIC AND ANAEROBIC Blood Culture adequate volume   CULT  05/17/2021 0953    NO GROWTH 5 DAYS Performed at [facility] [address], [place],  [address]    REPTSTATUS 05/22/2021 FINAL 05/17/2021 0953    Cardiac Enzymes: No results for input(s): CKTOTAL, CKMB, CKMBINDEX, TROPONINI in the last 168 hours. CBG: Recent Labs  Lab 07/24/21 0619 07/24/21 1136 07/24/21 1641 07/24/21 2136 07/25/21 0607  GLUCAP 92 75 97 126* 71   Iron Studies: No results for input(s): IRON, TIBC, TRANSFERRIN, FERRITIN in the last 72 hours. Lab Results  Component Value Date   INR 1.0 04/30/2021   Studies/Results: No results found.  Medications:  lacosamide (VIMPAT) IV 100 mg (07/24/21 2210)   thiamine injection 250 mg (07/24/21 1219)    (feeding supplement) PROSource Plus  30 mL Oral TID BM   aspirin  81 mg Oral Daily   calcitRIOL  0.5 mcg Oral Q M,W,F-HD   Chlorhexidine Gluconate Cloth  6 each Topical Q0600   heparin  5,000 Units Subcutaneous Q8H   latanoprost  1 drop Both Eyes QHS   letrozole  2.5 mg Oral Daily   levothyroxine  112 mcg Oral Q0600   magic mouthwash  5 mL Oral TID   metoprolol succinate  50 mg Oral Daily   OLANZapine zydis  2.5 mg Oral QHS   [START ON 07/28/2021] thiamine injection  100 mg Intravenous  Daily    Dialysis Orders: HD Orders: [name] MWF 3:45 hr 180 NRE 400/Autoflow 1.5 56 kg 3.0 K/2.5 Ca TDC  -No Heparin -Mircera 150 mcg IV Q 2 weeks (last dose 07/15/2021) -Venofer 50 mg IV weekly (last dose 07/15/2021) -Calcitriol 0.5 mcg PO TIW  Assessment/Plan: Seizure-experienced a seizure during her last outpatient HD on 07/15/21.  Neurology following.  Was on depakote but more somnolent- now on vimpat and MS initially improved but is now worse.   Altered Mental Status-Currently sleeping. CT head (-) acute intracranial abnormalities x 2, last 07/20/21.  Neurology re-consulted.  Noted previous episodes of combative/agitation-this can negatively impact whether she will be able to continue outpatient HD.  MS was improving but now worse.  Per Neuro ESRD - on HD MWF. Next HD for  07/27/21. UF as tolerated. Hypertension/volume  - Patient euvolemic on exam, Bps improving-continue HTN medication-monitor BP and HR. Anemia of CKD - Hgb 9.4-recently received ESA and Fe in outpatient-monitor trends Secondary Hyperparathyroidism -  Ca 8.5-continue PO calcitriol.  Nutrition - Continue carb-modified renal diet. Albumin 2.6-continue protein supplements. Dispo: overall poor prognosis--> ? If HD is contributing to [place] Pgr [phone] 07/25/2021,9:52 AM  LOS: 8 days

## 2021-07-25 NOTE — Progress Notes (Signed)
PROGRESS NOTE    Kirsten Mcmahon  NWG:[id number] DOB: 27-Apr-1946 DOA: 07/15/2021 PCP: [place]   Chief Complain:Seizure  Brief Narrative: Patient is a 75 year old female with history of ESRD on dialysis on Monday Wednesday Friday, history of breast cancer on letrozole, history of stroke, diabetes type 2, seizure disorder who was brought to the emergency department after she had seizure-like activity during hemodialysis.  As per the report, patient ran out of her [place] on 8/9.  Patient had 30 minutes left in her hemodialysis session, she started to have seizure-like activity.  Hemodialysis was stopped and she was brought to the emergency department.  She was postictal on presentation, lethargic, disoriented.  CT head did not show any acute intracranial normalities.  Neurology was consulted who recommended to continue Keppra 500 mg daily and 250 mg postdialysis initially but her hospital course was prolonged due to persistent encephalopathy, lethargic.  Keppra was changed to Depakote and now the Depakote has been changed to Vimpat with improvement in mental status.Nephrology also following for dialysis.  Hospital course also remarkable for persistent low blood glucose requiring D10 infusion.  Overall status has now improved, encephalopathy is resolving.  Plan to monitor for next 24 to 48 hours before safe discharge.  07/24/2021: Patient seen.  No significant history from patient.  Discussed with patient and son extensively.  Neurology input is appreciated.  Neurology team is directing patient's work-up.  Patient will be discharged once cleared for discharge by the neurology team.  07/25/2021: Patient remains somnolent.  Poor p.o. intake.  Low blood sugars noted.  No IV access.  PICC line is not an option considering the patient has ESRD and on hemodialysis.  Nephrology has okayed central line placement.  Interventional radiology office was called but not open today.  Discussed with  ICU team to see if they will be able to assist with central line placement.  ICU team is stated they will call us back (discussed with [name], ICU nurse practitioner).  Meanwhile, NG tube will be placed.  Hypoglycemic protocol ordered.  Patient's prognosis remains guarded.  Assessment & Plan:   Principal Problem:   Breakthrough seizure ([place]) Active Problems:   Essential hypertension   Hypothyroidism   Type 2 diabetes mellitus with ESRD (end-stage renal disease) (HCC)   End-stage renal disease on hemodialysis ([place])   Mixed diabetic hyperlipidemia associated with type 2 diabetes mellitus ([place])   Seizure ([place])   Altered mental status/agitation: Hospital course was remarkable for persistent confusion, agitation.  Usually alert and awake  at baseline but she was on/off confused as per son. Unclear etiology for persistent encephalopathy.  Neurology recommended infectious work-up: Including UA, chest x-ray. CXR did not show PNA .She had mild leukocytosis, afebrile. MRI of the brain did not show any acute intracranial normalities but showed generalized atrophy, chronic small vessel ischemic changes. cEEG showed cortical dysfunction in the right central parietal region no seizures. We need to avoid sedatives, narcotics as much as possible. Seizure medication has been changed to Vimpat.  Finally she is more alert and awake now. 07/24/21: Patient seen.  No significant history from patient.  Neurology team is directing work-up. 07/25/2021: No seizure activity noted.  Neurology is directing care.  Low blood sugar noted.  Hypoglycemia protocol.  NG tube placement.  Possible central line by ICU team.  Seizure: Had seizure activity while at dialysis.  She recently ran out of [place] on 8/9.  EEG showed cortical dysfunction in the right frontal region, moderate diffuse  encephalopathy, no seizures or epileptiform discharges.  Currently on vimpat 07/24/2021: No further symptoms reported. 07/25/2021: Patient is on  Vimpat.  Trying to establish IV access.  No further overt seizures noted.  Hypoglycemia: Unknown etiology. Not on any meds at home. Hemoglobin A1c of 5.6 as per 6/22. She was on gentle D10 .  Now stopped.  Encourage oral intake 07/24/21: Blood sugar has ranged from 75 to 126.  Low threshold to initiate caloric count. 07/25/2021: See above documentation.  ESRD on dialysis: Has tunneled dialysis catheter on the right chest.  Nephrology consulted and following for dialysis. 07/25/2021: Nephrology team is managing.  History of hypertension: Continue current medication.  Monitor blood pressure  Hypothyroidism: Continue levothyroxine.TSH on 8/10 is normal  H/O stroke: She is weak on left side,walks with cane. As per son,she is usually alert and awake ,usually oriented but sometimes confused  History of breast cancer: On remission.  On letrozole  Goals of care: Elderly patient with multiple comorbidities, on dialysis, encephalopathic, poor oral intake.  Still a full code.  We consulted palliative care for goals of care discussion, plan is to continue full scope treatment.  Debility/deconditioning: PT/OT recommended SNF.  [facility]  so goal is to go home.TOC following   DVT prophylaxis:Heparin [place] Code Status: Full Family Communication: Discussed with son on phone on 07/23/21 on phone    Dispo: The patient is from: Home              Anticipated d/c is to: Home              Patient currently is not medically stable to d/c.   Difficult to place patient No  Plan to monitor for next 24 to 48 hours expecting mental status to return to baseline, stability on blood sugars   Consultants: neurology  Procedures:None  Antimicrobials:  Anti-infectives (From admission, onward)    None       Subjective: No significant history from patient. Patient remains somnolent.  Objective: Vitals:   07/24/21 2033 07/24/21 2334 07/25/21 0348 07/25/21 0823  BP: (!) 150/98 (!) 151/92  (!) 143/88 125/66  Pulse: 91 98 (!) 114 88  Resp: 16 16 16 15   Temp: 98.5 F (36.9 C) 98.5 F (36.9 C) 98.3 F (36.8 C) 98.2 F (36.8 C)  TempSrc: Oral Oral Oral Oral  SpO2: 100% 100% 100% 100%  Weight:        Intake/Output Summary (Last 24 hours) at 07/25/2021 1130 Last data filed at 07/25/2021 0900 Gross per 24 hour  Intake 30 ml  Output --  Net 30 ml      Filed Weights   07/22/21 1150 07/24/21 0710 07/24/21 1021  Weight: 54 kg 55.2 kg 54.3 kg    Examination:  General exam: Patient is somnolent.  No history from patient. HEENT: PERRL Respiratory system:  no wheezes or crackles  Cardiovascular system: S1 & S2 heard, RRR.  Tunneled dialysis cath on the right chest Gastrointestinal system: Abdomen is nondistended, soft and nontender. Central nervous system: Alert and awake, confused Extremities: Left upper extremity lymphedema , no clubbing ,no cyanosis  Data Reviewed: I have personally reviewed following labs and imaging studies  CBC: Recent Labs  Lab 07/20/21 0934 07/22/21 0934 07/24/21 0735  WBC 11.7* 5.6 4.6  HGB 11.3* 10.4* 10.4*  HCT 35.9* 34.7* 33.6*  MCV 93.5 94.8 93.9  PLT 241 190 146*    Basic Metabolic Panel: Recent Labs  Lab 07/20/21 0934 07/22/21 0934 07/24/21 0735  NA 133* 135 132*  K 3.9 4.1 4.0  CL 97* 100 99  CO2 27 25 25   GLUCOSE 94 69* 73  BUN 18 15 17   CREATININE 4.87* 4.77* 5.28*  CALCIUM 8.4* 8.5* 8.4*  PHOS 3.9 4.3 4.4    GFR: Estimated Creatinine Clearance: 7.1 mL/min (A) (by C-G formula based on SCr of 5.28 mg/dL (H)). Liver Function Tests: Recent Labs  Lab 07/20/21 0934 07/22/21 0934 07/24/21 0735  ALBUMIN 2.6* 2.5* 2.4*    No results for input(s): LIPASE, AMYLASE in the last 168 hours. Recent Labs  Lab 07/21/21 1221  AMMONIA 21    Coagulation Profile: No results for input(s): INR, PROTIME in the last 168 hours. Cardiac Enzymes: No results for input(s): CKTOTAL, CKMB, CKMBINDEX, TROPONINI in the last 168  hours. BNP (last 3 results) No results for input(s): PROBNP in the last 8760 hours. HbA1C: No results for input(s): HGBA1C in the last 72 hours. CBG: Recent Labs  Lab 07/24/21 0619 07/24/21 1136 07/24/21 1641 07/24/21 2136 07/25/21 0607  GLUCAP 92 75 97 126* 71    Lipid Profile: No results for input(s): CHOL, HDL, LDLCALC, TRIG, CHOLHDL, LDLDIRECT in the last 72 hours. Thyroid Function Tests: No results for input(s): TSH, T4TOTAL, FREET4, T3FREE, THYROIDAB in the last 72 hours.  Anemia Panel: No results for input(s): VITAMINB12, FOLATE, FERRITIN, TIBC, IRON, RETICCTPCT in the last 72 hours.  Sepsis Labs: No results for input(s): PROCALCITON, LATICACIDVEN in the last 168 hours.  Recent Results (from the past 240 hour(s))  Resp Panel by RT-PCR (Flu A&B, Covid) Nasopharyngeal Swab     Status: None   Collection Time: 07/15/21  5:50 PM   Specimen: Nasopharyngeal Swab; Nasopharyngeal(NP) swabs in vial transport medium  Result Value Ref Range Status   SARS Coronavirus 2 by RT PCR NEGATIVE NEGATIVE Final    Comment: (NOTE) SARS-CoV-2 target nucleic acids are NOT DETECTED.  The SARS-CoV-2 RNA is generally detectable in upper respiratory specimens during the acute phase of infection. The lowest concentration of SARS-CoV-2 viral copies this assay can detect is 138 copies/mL. A negative result does not preclude SARS-Cov-2 infection and should not be used as the sole basis for treatment or other patient management decisions. A negative result may occur with  improper specimen collection/handling, submission of specimen other than nasopharyngeal swab, presence of viral mutation(s) within the areas targeted by this assay, and inadequate number of viral copies(<138 copies/mL). A negative result must be combined with clinical observations, patient history, and epidemiological information. The expected result is Negative.  Fact Sheet for Patients:   EntrepreneurPulse.com.au  Fact Sheet for Healthcare Providers:  IncredibleEmployment.be  This test is no t yet approved or cleared by the [place] FDA and  has been authorized for detection and/or diagnosis of SARS-CoV-2 by FDA under an Emergency Use Authorization (EUA). This EUA will remain  in effect (meaning this test can be used) for the duration of the COVID-19 declaration under Section 564(b)(1) of the Act, 21 U.S.C.section 360bbb-3(b)(1), unless the authorization is terminated  or revoked sooner.       Influenza A by PCR NEGATIVE NEGATIVE Final   Influenza B by PCR NEGATIVE NEGATIVE Final    Comment: (NOTE) The Xpert Xpress SARS-CoV-2/FLU/RSV plus assay is intended as an aid in the diagnosis of influenza from Nasopharyngeal swab specimens and should not be used as a sole basis for treatment. Nasal washings and aspirates are unacceptable for Xpert Xpress SARS-CoV-2/FLU/RSV testing.  Fact Sheet for Patients: EntrepreneurPulse.com.au  Fact Sheet  for Healthcare Providers: IncredibleEmployment.be  This test is not yet approved or cleared by the [place] and has been authorized for detection and/or diagnosis of SARS-CoV-2 by FDA under an Emergency Use Authorization (EUA). This EUA will remain in effect (meaning this test can be used) for the duration of the COVID-19 declaration under Section 564(b)(1) of the Act, 21 U.S.C. section 360bbb-3(b)(1), unless the authorization is terminated or revoked.  Performed at [facility], [place] [address], [place], [place] [address]           Radiology Studies: No results found.      Scheduled Meds:  (feeding supplement) PROSource Plus  30 mL Oral TID BM   aspirin  81 mg Oral Daily   calcitRIOL  0.5 mcg Oral Q M,W,F-HD   Chlorhexidine Gluconate Cloth  6 each Topical Q0600   heparin  5,000 Units Subcutaneous Q8H   latanoprost  1  drop Both Eyes QHS   letrozole  2.5 mg Oral Daily   levothyroxine  112 mcg Oral Q0600   magic mouthwash  5 mL Oral TID   metoprolol succinate  50 mg Oral Daily   OLANZapine zydis  2.5 mg Oral QHS   [START ON 07/28/2021] thiamine injection  100 mg Intravenous Daily   Continuous Infusions:  lacosamide (VIMPAT) IV 100 mg (07/25/21 1002)   thiamine injection 250 mg (07/24/21 1219)     LOS: 8 days    Time spent: 25 mins.More than 50% of that time was spent in counseling and/or coordination of care.   Bonnell Public, MD [facility] P8/20/2022, 11:30 AM

## 2021-07-25 NOTE — Plan of Care (Signed)
  Problem: Education: Goal: Knowledge of General Education information will improve Description: Including pain rating scale, medication(s)/side effects and non-pharmacologic comfort measures Outcome: Progressing   Problem: Health Behavior/Discharge Planning: Goal: Ability to manage health-related needs will improve Outcome: Progressing   Problem: Clinical Measurements: Goal: Ability to maintain clinical measurements within normal limits will improve Outcome: Progressing Goal: Will remain free from infection Outcome: Progressing Goal: Diagnostic test results will improve Outcome: Progressing Goal: Cardiovascular complication will be avoided Outcome: Progressing   Problem: Activity: Goal: Risk for activity intolerance will decrease Outcome: Progressing   Problem: Nutrition: Goal: Adequate nutrition will be maintained Outcome: Progressing   Problem: Coping: Goal: Level of anxiety will decrease Outcome: Progressing   Problem: Elimination: Goal: Will not experience complications related to bowel motility Outcome: Progressing Goal: Will not experience complications related to urinary retention Outcome: Progressing   Problem: Pain Managment: Goal: General experience of comfort will improve Outcome: Progressing   Problem: Safety: Goal: Ability to remain free from injury will improve Outcome: Progressing   Problem: Skin Integrity: Goal: Risk for impaired skin integrity will decrease Outcome: Progressing   Problem: Education: Goal: Expressions of having a comfortable level of knowledge regarding the disease process will increase Outcome: Progressing   Problem: Coping: Goal: Ability to adjust to condition or change in health will improve Outcome: Progressing Goal: Ability to identify appropriate support needs will improve Outcome: Progressing   Problem: Health Behavior/Discharge Planning: Goal: Compliance with prescribed medication regimen will improve Outcome:  Progressing   Problem: Medication: Goal: Risk for medication side effects will decrease Outcome: Progressing   Problem: Clinical Measurements: Goal: Complications related to the disease process, condition or treatment will be avoided or minimized Outcome: Progressing Goal: Diagnostic test results will improve Outcome: Progressing   Problem: Safety: Goal: Verbalization of understanding the information provided will improve Outcome: Progressing   Problem: Self-Concept: Goal: Level of anxiety will decrease Outcome: Progressing Goal: Ability to verbalize feelings about condition will improve Outcome: Progressing   Problem: Safety: Goal: Non-violent Restraint(s) Outcome: Progressing

## 2021-07-26 LAB — GLUCOSE, CAPILLARY
Glucose-Capillary: 92 mg/dL (ref 70–99)
Glucose-Capillary: 72 mg/dL (ref 70–99)
Glucose-Capillary: 135 mg/dL — ABNORMAL HIGH (ref 70–99)
Glucose-Capillary: 127 mg/dL — ABNORMAL HIGH (ref 70–99)

## 2021-07-26 LAB — PHOSPHORUS
Phosphorus: 4.3 mg/dL (ref 2.5–4.6)
Phosphorus: 4 mg/dL (ref 2.5–4.6)

## 2021-07-26 LAB — MAGNESIUM
Magnesium: 2 mg/dL (ref 1.7–2.4)
Magnesium: 2.1 mg/dL (ref 1.7–2.4)

## 2021-07-26 MED ORDER — OSMOLITE 1.5 CAL PO LIQD: 1000.0000 mL | ORAL | Status: DC

## 2021-07-26 MED ORDER — VITAL HIGH PROTEIN PO LIQD: 1000.0000 mL | ORAL | Status: DC

## 2021-07-26 MED ORDER — PROSOURCE TF PO LIQD: 45.0000 mL | Freq: Every day | ORAL | Status: DC

## 2021-07-26 MED ORDER — RENA-VITE PO TABS
1.0000 | ORAL_TABLET | Freq: Every day | ORAL | Status: DC
  Administered 2021-07-26 – 2021-07-30 (×5): 1 via ORAL
  Filled 2021-07-26 (×5): qty 1

## 2021-07-26 MED ORDER — DEXTROSE 10 % IV SOLN: INTRAVENOUS | Status: DC

## 2021-07-26 NOTE — Progress Notes (Addendum)
Neurology Progress Note  S: Mumbles when NP asks her about having pain. Does not answer further questions for ROS.   Overnight hypoglycemia.   O: Current vital signs: BP 112/68 (BP Location: Left Arm)   Pulse 79   Temp 98.6 F (37 C) (Oral)   Resp 14   Wt 54.3 kg   SpO2 100%   BMI 21.90 kg/m  Vital signs in last 24 hours: Temp:  [97.5 F (36.4 C)-98.6 F (37 C)] 98.6 F (37 C) (08/21 0731) Pulse Rate:  [74-90] 79 (08/21 0731) Resp:  [12-18] 14 (08/21 0731) BP: (98-131)/(59-74) 112/68 (08/21 0731) SpO2:  [96 %-100 %] 100 % (08/21 0359)  GENERAL: chronically ill  appearing. Awake, alert in NAD. HEENT: Normocephalic and atraumatic. LUNGS: Normal respiratory effort.  Ext: warm.  NEURO:  Mental Status: Alert. Eyes open. Able to tell NP her name. Does not answer any other orientation questions. Squeezes NP hand with her right hand. Follows no other commands.  Speech/Language: speech is without aphasia or dysarthria when she says her name and when she says quit. Bradyphrenic. No fluency. Unable to name or repeat.   Cranial Nerves:  PERRL. Eyelids elevate symmetrically. Face is symmetrical at rest. Hearing intact to voice. RUE grip 4-/5 and moves RUE spontaneously. No movement of LUE noted and hypertonic LUE. Withdraws RLE to noxious stimuli and says "quit". Moves LLE with plantar stimulation. Able to track examiner.  Gait- deferred.  Medications  Current Facility-Administered Medications:    (feeding supplement) PROSource Plus liquid 30 mL, 30 mL, Oral, TID BM, Tobie Poet E, NP, 30 mL at 07/25/21 2100   acetaminophen (TYLENOL) tablet 650 mg, 650 mg, Oral, Q6H PRN, 650 mg at 07/18/21 1816 **OR** acetaminophen (TYLENOL) suppository 650 mg, 650 mg, Rectal, Q6H PRN, Shalhoub, Sherryll Burger, MD   aspirin chewable tablet 81 mg, 81 mg, Oral, Daily, Adhikari, Amrit, MD, 81 mg at 07/25/21 1125   calcitRIOL (ROCALTROL) capsule 0.5 mcg, 0.5 mcg, Oral, Q M,W,F-HD, Valentina Gu,  NP, 0.5 mcg at 07/22/21 1411   Chlorhexidine Gluconate Cloth 2 % PADS 6 each, 6 each, Topical, Q0600, Valentina Gu, NP, 6 each at 07/26/21 0536   [START ON 07/27/2021] feeding supplement (OSMOLITE 1.5 CAL) liquid 1,000 mL, 1,000 mL, Per Tube, Continuous, Dana Allan I, MD   Derrill Memo ON 07/27/2021] feeding supplement (PROSource TF) liquid 45 mL, 45 mL, Per Tube, Daily, Ogbata, Sylvester I, MD   glucagon (human recombinant) (GLUCAGEN) injection 1 mg, 1 mg, Intramuscular, STAT, Ogbata, Sylvester I, MD   heparin injection 5,000 Units, 5,000 Units, Subcutaneous, Q8H, Shalhoub, Sherryll Burger, MD, 5,000 Units at 07/26/21 0535   labetalol (NORMODYNE) injection 10 mg, 10 mg, Intravenous, Q2H PRN, Adhikari, Amrit, MD   lacosamide (VIMPAT) 100 mg in sodium chloride 0.9 % 25 mL IVPB, 100 mg, Intravenous, Q12H, Greta Doom, MD, Last Rate: 70 mL/hr at 07/25/21 2115, 100 mg at 07/25/21 2115   latanoprost (XALATAN) 0.005 % ophthalmic solution 1 drop, 1 drop, Both Eyes, QHS, Shalhoub, Sherryll Burger, MD, 1 drop at 07/25/21 2119   letrozole [facility]) tablet 2.5 mg, 2.5 mg, Oral, Daily, Shalhoub, Sherryll Burger, MD, 2.5 mg at 07/25/21 1125   levothyroxine (SYNTHROID) tablet 112 mcg, 112 mcg, Oral, Q0600, Vernelle Emerald, MD, 112 mcg at 07/26/21 0535   magic mouthwash, 5 mL, Oral, TID, Mariana Kaufman J, NP, 5 mL at 07/25/21 2101   metoprolol succinate (TOPROL-XL) 24 hr tablet 50 mg, 50 mg, Oral, Daily, Shalhoub, Sherryll Burger, MD,  50 mg at 07/25/21 1125   multivitamin (RENA-VIT) tablet 1 tablet, 1 tablet, Oral, QHS, Ogbata, Sylvester I, MD   OLANZapine zydis (ZYPREXA) disintegrating tablet 2.5 mg, 2.5 mg, Oral, QHS, Mahan, Kasie J, NP, 2.5 mg at 07/25/21 2101   ondansetron (ZOFRAN) tablet 4 mg, 4 mg, Oral, Q6H PRN **OR** ondansetron (ZOFRAN) injection 4 mg, 4 mg, Intravenous, Q6H PRN, Shalhoub, Sherryll Burger, MD, 4 mg at 07/17/21 2025   polyethylene glycol (MIRALAX / GLYCOLAX) packet 17 g, 17 g, Oral, Daily PRN, Shalhoub,  Sherryll Burger, MD   [COMPLETED] thiamine 500mg  in normal saline (30ml) IVPB, 500 mg, Intravenous, Q8H, Last Rate: 100 mL/hr at 07/22/21 0621, 500 mg at 07/22/21 3903 **FOLLOWED BY** thiamine (B-1) 250 mg in sodium chloride 0.9 % 50 mL IVPB, 250 mg, Intravenous, Daily, Last Rate: 100 mL/hr at 07/25/21 1132, 250 mg at 07/25/21 1132 **FOLLOWED BY** [START ON 07/28/2021] thiamine (B-1) injection 100 mg, 100 mg, Intravenous, Daily, Kirby-Graham, Karsten Fells, NP  Pertinent Labs Redo blood cultures from 07/25/21 are pending. R/p CXR 07/25/21 neg for acute finding.   No new Imaging  Assessment: 75 yo female on Hospital Day #10 for altered mental status in face of dementia, delirium, seizure, and old stroke. Imaging and other testing has not revealed reversible cause for her encephalopathy. Her presentation continues to be likely secondary to toxic metabolic encephalopathy and delirium which waxes and wanes. It may takes weeks to months for delirium to clear. However, she is slightly better on exam today, but certainly not back to baseline. Due to waxing and waning exam and possibly worse after changing AED to Vimpat, will do overnight EEG to check for seizure activity.    Impression: -Multifactorial toxic/metabolic encephalopathy.  -Delirium.  -Seizure this hospitalization.     Recommendations/Plan: -cEEG with overnight video.  -f/up blood cultures.  -patient anuric, so no UA.  -Nutrition per medical team.  -Hypoglycemia per medical team.  -Continue delirium precautions.  -Continue seizure precautions.  -Ativan 2mg  IV for seizure over 5 mins and call neurology.  -Continue Vimpat 100mg  IV q12 hours.  -Continue to avoid sedative medications and those on the [place] List for the elderly.  -Agree with palliative care consult and 07/25/21 note.   Pt seen by Clance Boll, MSN, APN-BC/Nurse Practitioner/Neuro and plan discussed with Dr. Leonel Ramsay who agrees.  Pager: [phone]  I have seen the patient, she  is slightly more awake again today.  Thus far, work-up has been completely negative and I suspect this may simply represent worsening of her dementia which is fairly advanced at baseline secondary to initial delirium.  Given that she did have some improvement after changing from Depakote to Vimpat, then we worsened, as well as the fact that if her mental status does not improve then she is likely to not be a candidate for outpatient hemodialysis, I think that one more overnight EEG study would be reasonable.  If this overnight EEG is negative, but I do not have any other interventions or neurodiagnostics at this time, and care will be supportive.  Neurology will follow up EEG, but if this is negative then we will only be available on an as-needed basis.  Roland Rack, MD [facility] [phone]  If 7pm- 7am, please page neurology on call as listed in [place].

## 2021-07-26 NOTE — Progress Notes (Signed)
[facility] Progress Note   Subjective:     Seen in room.  Hypoglycemic overnight.  Pocketed food in her mouth this AM- helped her spit it out.    Objective Vitals:   07/25/21 2016 07/25/21 2356 07/26/21 0359 07/26/21 0731  BP: (!) 98/59 131/73 125/70 112/68  Pulse: 74 83 80 79  Resp: 17 18 18 14   Temp: 98 F (36.7 C) (!) 97.5 F (36.4 C) 97.6 F (36.4 C) 98.6 F (37 C)  TempSrc:  Oral Oral Oral  SpO2: 100% 100% 100%   Weight:       Physical Exam General: frail-appearing, opens eyes and mumbles today Lungs: clear bilaterally Heart: RRR. No murmur, rubs or gallops.  Abdomen: soft, nontender, +BS Lower extremities:no edema  Neuro: opens eyes and mumbles today Dialysis Access: [facility]  Filed Weights   07/22/21 1150 07/24/21 0710 07/24/21 1021  Weight: 54 kg 55.2 kg 54.3 kg    Intake/Output Summary (Last 24 hours) at 07/26/2021 1039 Last data filed at 07/26/2021 0500 Gross per 24 hour  Intake 30 ml  Output 2 ml  Net 28 ml     Additional Objective Labs: Basic Metabolic Panel: Recent Labs  Lab 07/20/21 0934 07/22/21 0934 07/24/21 0735 07/26/21 0915  NA 133* 135 132*  --   K 3.9 4.1 4.0  --   CL 97* 100 99  --   CO2 27 25 25   --   GLUCOSE 94 69* 73  --   BUN 18 15 17   --   CREATININE 4.87* 4.77* 5.28*  --   CALCIUM 8.4* 8.5* 8.4*  --   PHOS 3.9 4.3 4.4 4.3   Liver Function Tests: Recent Labs  Lab 07/20/21 0934 07/22/21 0934 07/24/21 0735  ALBUMIN 2.6* 2.5* 2.4*   No results for input(s): LIPASE, AMYLASE in the last 168 hours. CBC: Recent Labs  Lab 07/20/21 0934 07/22/21 0934 07/24/21 0735  WBC 11.7* 5.6 4.6  HGB 11.3* 10.4* 10.4*  HCT 35.9* 34.7* 33.6*  MCV 93.5 94.8 93.9  PLT 241 190 146*   Blood Culture    Component Value Date/Time   SDES BLOOD LEFT FOREARM 05/17/2021 0953   SPECREQUEST  05/17/2021 0953    BOTTLES DRAWN AEROBIC AND ANAEROBIC Blood Culture adequate volume   CULT  05/17/2021 0953    NO GROWTH 5  DAYS Performed at [facility], [place] [address], [place], [place] [address]    REPTSTATUS 05/22/2021 FINAL 05/17/2021 0953    Cardiac Enzymes: No results for input(s): CKTOTAL, CKMB, CKMBINDEX, TROPONINI in the last 168 hours. CBG: Recent Labs  Lab 07/25/21 1601 07/25/21 1646 07/25/21 1720 07/25/21 2115 07/26/21 0614  GLUCAP 52* 59* 87 125* 92   Iron Studies: No results for input(s): IRON, TIBC, TRANSFERRIN, FERRITIN in the last 72 hours. Lab Results  Component Value Date   INR 1.0 04/30/2021   Studies/Results: DG CHEST PORT 1 VIEW  Result Date: 07/25/2021 CLINICAL DATA:  Altered mental status. EXAM: PORTABLE CHEST 1 VIEW COMPARISON:  07/20/2021 and older studies. FINDINGS: Cardiac silhouette normal in size. No mediastinal or hilar masses. Stable right internal jugular tunneled dual lumen central venous catheter, tip in the right atrium. Clear lungs.  No pleural effusion or pneumothorax. Skeletal structures grossly intact. IMPRESSION: No acute cardiopulmonary disease. Electronically Signed   By: Lajean Manes M.D.   On: 07/25/2021 16:21    Medications:  [START ON 07/27/2021] feeding supplement (OSMOLITE 1.5 CAL)     lacosamide (VIMPAT) IV 100  mg (07/25/21 2115)   thiamine injection 250 mg (07/25/21 1132)    (feeding supplement) PROSource Plus  30 mL Oral TID BM   aspirin  81 mg Oral Daily   calcitRIOL  0.5 mcg Oral Q M,W,F-HD   Chlorhexidine Gluconate Cloth  6 each Topical Q0600   [START ON 07/27/2021] feeding supplement (PROSource TF)  45 mL Per Tube Daily   glucagon (human recombinant)  1 mg Intramuscular STAT   heparin  5,000 Units Subcutaneous Q8H   latanoprost  1 drop Both Eyes QHS   letrozole  2.5 mg Oral Daily   levothyroxine  112 mcg Oral Q0600   magic mouthwash  5 mL Oral TID   metoprolol succinate  50 mg Oral Daily   multivitamin  1 tablet Oral QHS   OLANZapine zydis  2.5 mg Oral QHS   [START ON 07/28/2021] thiamine injection  100 mg Intravenous Daily     Dialysis Orders: HD Orders: [name] MWF 3:45 hr 180 NRE 400/[place] 1.5 56 kg 3.0 K/2.5 Ca TDC  -No Heparin -Mircera 150 mcg IV Q 2 weeks (last dose 07/15/2021) -Venofer 50 mg IV weekly (last dose 07/15/2021) -Calcitriol 0.5 mcg PO TIW  Assessment/Plan: Seizure-experienced a seizure during her last outpatient HD on 07/15/21.  Neurology following.  Was on depakote but more somnolent- now on vimpat and MS initially improved but is now worse.   Altered Mental Status-Currently sleeping. CT head (-) acute intracranial abnormalities x 2, last 07/20/21.  Neurology on board.  Workup negative thus far- ? Delirium.  She is an inappropriate candidate for OP dialysis in this state. ESRD - on HD MWF. Next HD for 07/27/21. UF as tolerated. Hypertension/volume  - Patient euvolemic on exam, Bps improving-continue HTN medication-monitor BP and HR. Anemia of CKD - Hgb 9.4-recently received ESA and Fe in outpatient-monitor trends Secondary Hyperparathyroidism -  Ca 8.5-continue PO calcitriol.  Nutrition - Continue carb-modified renal diet. Albumin 2.6-continue protein supplements. Dispo: overall poor prognosis--> appreciate palliative care c/s  Madelon Lips MD [place] [facility] [phone] 07/26/2021,10:39 AM  LOS: 9 days

## 2021-07-26 NOTE — Progress Notes (Signed)
Initial Nutrition Assessment  DOCUMENTATION CODES:   Not applicable  INTERVENTION:   -Continue 30 ml Prosource Plus TID, each supplement provides 100 kcals and 15 grams protein -Magic cup TID with meals, each supplement provides 290 kcal and 9 grams of protein  -Renal MVI daily -Feeding assistance with meals -Once cortrak is placed:  Initiate Osmolite 1.5 @ 20 ml/hr via cortrak tube and increase by 10 ml every 12 hours to goal rate of 50 ml/hr.   45 ml Prosource TF daily.    Tube feeding regimen provides 1840 kcal (100% of needs), 86 grams of protein, and 914 ml of H2O.    -Monitor Mg, K, and Phos and replete as needed due to high refeeding risk   NUTRITION DIAGNOSIS:   Inadequate oral intake related to decreased appetite as evidenced by meal completion < 50%.  GOAL:   Patient will meet greater than or equal to 90% of their needs  MONITOR:   PO intake, Supplement acceptance, Diet advancement, Labs, Weight trends, TF tolerance, Skin, I & O's  REASON FOR ASSESSMENT:   Consult Enteral/tube feeding initiation and management  ASSESSMENT:   75 year old female with past medical history of hypothyroidism, remote history of breast cancer (2017), end-stage renal disease (MWF HD), previous strokes (right MCA stroke in 1998, and recent multifocal punctate strokes in 05/2021), seizure disorder (Dx 01/2021), diabetes mellitus type 2 (hgb A1C 5.6 05/2021) strep bacteremia (treated with IV vancomycin with hemodialysis, 04/2021) who presents to [facility] emergency department via EMS after patient exhibited seizure-like activity during hemodialysis.  Pt admitted with breakthrough seizures.   8/19- s/p BSE- advanced to dysphagia 3 diet with thin liquids  Reviewed I/O's: +58 ml x 24 hours and +1.9 L since admission   Pt unavailable at time of visit. Attempted to speak with pt via call to hospital room phone, however, unable to reach. RD unable to obtain further nutrition-related  history or complete nutrition-focused physical exam at this time.    Pt familiar to this RD from prior admissions.   Pt with waxing and waning mental status. She is requiring assistance to eat. Noted meal completions 0-60%.   Reviewed wt hx; pt has experienced a 5.2% wt loss over the past 3 months, which is not significant for time frame. Unsure of EDW.   Plan for cortrak tube placement tomorrow to initiate enteral feedings secondary to prolonged poor oral intake. Palliative care has been following; pt son continues to desire aggressive, full scope care, including HD.   Medications reviewed and include keppra and vimpat.   Lab Results  Component Value Date   HGBA1C 5.6 05/13/2021   PTA DM medications are none.   Labs reviewed: CBGS: 59-125 (inpatient orders for glycemic control are none).    Diet Order:   Diet Order             DIET DYS 3 Room service appropriate? No; Fluid consistency: Thin; Fluid restriction: 1200 mL Fluid  Diet effective now                   EDUCATION NEEDS:   Not appropriate for education at this time  Skin:  Skin Assessment: Skin Integrity Issues: Skin Integrity Issues:: Stage II Stage II: buttocks  Last BM:  07/26/21  Height:   Ht Readings from Last 1 Encounters:  05/18/21 5\' 2"  (1.575 m)    Weight:   Wt Readings from Last 1 Encounters:  07/24/21 54.3 kg    Ideal Body Weight:  50 kg  BMI:  Body mass index is 21.9 kg/m.  Estimated Nutritional Needs:   Kcal:  1700-1900  Protein:  85-100 grams  Fluid:  1000 ml + UOP    [name], RD, LDN, CDCES Registered Dietitian II Certified Diabetes Care and Education Specialist Please refer to [facility] for RD and/or RD on-call/weekend/after hours pager

## 2021-07-26 NOTE — Progress Notes (Signed)
PROGRESS NOTE    Kirsten Mcmahon  SWH:[id number] DOB: 05/07/1946 DOA: 07/15/2021 PCP: [place]   Chief Complain:Seizure  Brief Narrative: Patient is a 75 year old female with history of ESRD on dialysis on Monday Wednesday Friday, history of breast cancer on letrozole, history of stroke, diabetes type 2, seizure disorder who was brought to the emergency department after she had seizure-like activity during hemodialysis.  As per the report, patient ran out of her [place] on 8/9.  Patient had 30 minutes left in her hemodialysis session, she started to have seizure-like activity.  Hemodialysis was stopped and she was brought to the emergency department.  She was postictal on presentation, lethargic, disoriented.  CT head did not show any acute intracranial normalities.  Neurology was consulted who recommended to continue Keppra 500 mg daily and 250 mg postdialysis initially but her hospital course was prolonged due to persistent encephalopathy, lethargic.  Keppra was changed to Depakote and now the Depakote has been changed to Vimpat with improvement in mental status.Nephrology also following for dialysis.  Hospital course also remarkable for persistent low blood glucose requiring D10 infusion.  Overall status has now improved, encephalopathy is resolving.  Plan to monitor for next 24 to 48 hours before safe discharge.  07/24/2021: Patient seen.  No significant history from patient.  Discussed with patient and son extensively.  Neurology input is appreciated.  Neurology team is directing patient's work-up.  Patient will be discharged once cleared for discharge by the neurology team.  07/25/2021: Patient remains somnolent.  Poor p.o. intake.  Low blood sugars noted.  No IV access.  PICC line is not an option considering the patient has ESRD and on hemodialysis.  Nephrology has okayed central line placement.  Interventional radiology office was called but not open today.  Discussed with  ICU team to see if they will be able to assist with central line placement.  ICU team is stated they will call us back (discussed with [name], ICU nurse practitioner).  Meanwhile, NG tube will be placed.  Hypoglycemic protocol ordered.  Patient's prognosis remains guarded.  07/26/2021: Patient seen.  Patient is to be more interactive today.  Discussed with the patient's nurse, patient's family is refusing NG tube placement for tube feeds.  Poor p.o. intake.  Blood sugar has ranged from 72-125.  We will start patient on D10 at 50 cc/h.  Continue to monitor blood sugar closely.  Palliative care team has been consulted.  Input from nephrology and neurology team appreciated.  For EEG overnight tonight.   Assessment & Plan:   Principal Problem:   Breakthrough seizure ([place]) Active Problems:   Essential hypertension   Hypothyroidism   Type 2 diabetes mellitus with ESRD (end-stage renal disease) (HCC)   End-stage renal disease on hemodialysis ([place])   Mixed diabetic hyperlipidemia associated with type 2 diabetes mellitus ([place])   Seizure ([place])   Altered mental status/agitation: Hospital course was remarkable for persistent confusion, agitation.  Usually alert and awake  at baseline but she was on/off confused as per son. Unclear etiology for persistent encephalopathy.  Neurology recommended infectious work-up: Including UA, chest x-ray. CXR did not show PNA .She had mild leukocytosis, afebrile. MRI of the brain did not show any acute intracranial normalities but showed generalized atrophy, chronic small vessel ischemic changes. cEEG showed cortical dysfunction in the right central parietal region no seizures. We need to avoid sedatives, narcotics as much as possible. Seizure medication has been changed to Vimpat.  Finally she is more  alert and awake now. 07/24/21: Patient seen.  No significant history from patient.  Neurology team is directing work-up. 07/25/2021: No seizure activity noted.  Neurology is  directing care.  Low blood sugar noted.  Hypoglycemia protocol.  NG tube placement.  Possible central line by ICU team. 07/26/2021: See above documentation.  Seizure: Had seizure activity while at dialysis.  She recently ran out of [place] on 8/9.  EEG showed cortical dysfunction in the right frontal region, moderate diffuse encephalopathy, no seizures or epileptiform discharges.  Currently on vimpat 07/24/2021: No further symptoms reported. 07/25/2021: Patient is on Vimpat.  Trying to establish IV access.  No further overt seizures noted. 07/26/2021: For EEG overnight tonight.  Hypoglycemia: Unknown etiology. Not on any meds at home. Hemoglobin A1c of 5.6 as per 6/22. She was on gentle D10 .  Now stopped.  Encourage oral intake 07/24/21: Blood sugar has ranged from 75 to 126.  Low threshold to initiate caloric count. 07/25/2021: See above documentation. 07/26/2021: Patient's family refused NG tube for tube feeds.  Continue to monitor blood sugar closely.  Start D10 at 50 cc/h.  ESRD on dialysis: Has tunneled dialysis catheter on the right chest.  Nephrology consulted and following for dialysis. 07/25/2021: Nephrology team is managing. 07/26/2021: Currently deemed an appropriate for outpatient hemodialysis at current state.  Nephrology is following closely.  Continue hemodialysis while in the hospital.  History of hypertension: Continue current medication.  Monitor blood pressure  Hypothyroidism: Continue levothyroxine.TSH on 8/10 is normal  H/O stroke: She is weak on left side,walks with cane. As per son,she is usually alert and awake ,usually oriented but sometimes confused  History of breast cancer: On remission.  On letrozole  Goals of care: Elderly patient with multiple comorbidities, on dialysis, encephalopathic, poor oral intake.  Still a full code.  We consulted palliative care for goals of care discussion, plan is to continue full scope treatment.  Debility/deconditioning: PT/OT recommended  SNF.  Currently, patient's mental status as tolerated.  We will continue to assess.  Note: As per prior documentation: Patient is enrolled with [facility]  so goal is to go home.TOC following   DVT prophylaxis:Heparin [place] Code Status: Full Family Communication: Discussed with son on phone on 07/23/21 on phone    Dispo: The patient is from: Home              Anticipated d/c is to: Home              Patient currently is not medically stable to d/c.   Difficult to place patient No  Plan to monitor for next 24 to 48 hours expecting mental status to return to baseline, stability on blood sugars   Consultants: neurology  Procedures:None  Antimicrobials:  Anti-infectives (From admission, onward)    None       Subjective: No significant history from patient. Patient is more interactive today.    Objective: Vitals:   07/25/21 2356 07/26/21 0359 07/26/21 0731 07/26/21 1146  BP: 131/73 125/70 112/68 126/78  Pulse: 83 80 79 77  Resp: 18 18 14 20   Temp: (!) 97.5 F (36.4 C) 97.6 F (36.4 C) 98.6 F (37 C) 98.1 F (36.7 C)  TempSrc: Oral Oral Oral Oral  SpO2: 100% 100%  100%  Weight:        Intake/Output Summary (Last 24 hours) at 07/26/2021 1246 Last data filed at 07/26/2021 0500 Gross per 24 hour  Intake 30 ml  Output 2 ml  Net 28 ml  Filed Weights   07/22/21 1150 07/24/21 0710 07/24/21 1021  Weight: 54 kg 55.2 kg 54.3 kg    Examination:  General exam: Patient is more interactive today.  No history from patient. HEENT: PERRL Respiratory system:  no wheezes or crackles  Cardiovascular system: S1 & S2 heard, RRR.  Tunneled dialysis cath on the right chest Gastrointestinal system: Abdomen is nondistended, soft and nontender. Central nervous system: More interactive today.   Extremities: Left upper extremity lymphedema , no clubbing ,no cyanosis  Data Reviewed: I have personally reviewed following labs and imaging studies  CBC: Recent Labs  Lab 07/20/21 0934  07/22/21 0934 07/24/21 0735  WBC 11.7* 5.6 4.6  HGB 11.3* 10.4* 10.4*  HCT 35.9* 34.7* 33.6*  MCV 93.5 94.8 93.9  PLT 241 190 146*    Basic Metabolic Panel: Recent Labs  Lab 07/20/21 0934 07/22/21 0934 07/24/21 0735 07/26/21 0915  NA 133* 135 132*  --   K 3.9 4.1 4.0  --   CL 97* 100 99  --   CO2 27 25 25   --   GLUCOSE 94 69* 73  --   BUN 18 15 17   --   CREATININE 4.87* 4.77* 5.28*  --   CALCIUM 8.4* 8.5* 8.4*  --   MG  --   --   --  2.0  PHOS 3.9 4.3 4.4 4.3    GFR: Estimated Creatinine Clearance: 7.1 mL/min (A) (by C-G formula based on SCr of 5.28 mg/dL (H)). Liver Function Tests: Recent Labs  Lab 07/20/21 0934 07/22/21 0934 07/24/21 0735  ALBUMIN 2.6* 2.5* 2.4*    No results for input(s): LIPASE, AMYLASE in the last 168 hours. Recent Labs  Lab 07/21/21 1221  AMMONIA 21    Coagulation Profile: No results for input(s): INR, PROTIME in the last 168 hours. Cardiac Enzymes: No results for input(s): CKTOTAL, CKMB, CKMBINDEX, TROPONINI in the last 168 hours. BNP (last 3 results) No results for input(s): PROBNP in the last 8760 hours. HbA1C: No results for input(s): HGBA1C in the last 72 hours. CBG: Recent Labs  Lab 07/25/21 1646 07/25/21 1720 07/25/21 2115 07/26/21 0614 07/26/21 1141  GLUCAP 59* 87 125* 92 72    Lipid Profile: No results for input(s): CHOL, HDL, LDLCALC, TRIG, CHOLHDL, LDLDIRECT in the last 72 hours. Thyroid Function Tests: No results for input(s): TSH, T4TOTAL, FREET4, T3FREE, THYROIDAB in the last 72 hours.  Anemia Panel: No results for input(s): VITAMINB12, FOLATE, FERRITIN, TIBC, IRON, RETICCTPCT in the last 72 hours.  Sepsis Labs: No results for input(s): PROCALCITON, LATICACIDVEN in the last 168 hours.  Recent Results (from the past 240 hour(s))  Culture, blood (routine x 2)     Status: None (Preliminary result)   Collection Time: 07/25/21  4:38 PM   Specimen: BLOOD RIGHT HAND  Result Value Ref Range Status    Specimen Description BLOOD RIGHT HAND  Final   Special Requests   Final    BOTTLES DRAWN AEROBIC AND ANAEROBIC Blood Culture results may not be optimal due to an inadequate volume of blood received in culture bottles   Culture   Final    NO GROWTH < 24 HOURS Performed at [facility], [place] [address], [place], [place] [address]    Report Status PENDING  Incomplete  Culture, blood (routine x 2)     Status: None (Preliminary result)   Collection Time: 07/25/21  4:42 PM   Specimen: BLOOD  Result Value Ref Range Status   Specimen Description  BLOOD RIGHT ANTECUBITAL  Final   Special Requests   Final    BOTTLES DRAWN AEROBIC AND ANAEROBIC Blood Culture adequate volume   Culture   Final    NO GROWTH < 24 HOURS Performed at [facility], [address], [place], [place] [address]    Report Status PENDING  Incomplete          Radiology Studies: DG CHEST PORT 1 VIEW  Result Date: 07/25/2021 CLINICAL DATA:  Altered mental status. EXAM: PORTABLE CHEST 1 VIEW COMPARISON:  07/20/2021 and older studies. FINDINGS: Cardiac silhouette normal in size. No mediastinal or hilar masses. Stable right internal jugular tunneled dual lumen central venous catheter, tip in the right atrium. Clear lungs.  No pleural effusion or pneumothorax. Skeletal structures grossly intact. IMPRESSION: No acute cardiopulmonary disease. Electronically Signed   By: Lajean Manes M.D.   On: 07/25/2021 16:21        Scheduled Meds:  (feeding supplement) PROSource Plus  30 mL Oral TID BM   aspirin  81 mg Oral Daily   calcitRIOL  0.5 mcg Oral Q M,W,F-HD   Chlorhexidine Gluconate Cloth  6 each Topical Q0600   [START ON 07/27/2021] feeding supplement (PROSource TF)  45 mL Per Tube Daily   glucagon (human recombinant)  1 mg Intramuscular STAT   heparin  5,000 Units Subcutaneous Q8H   latanoprost  1 drop Both Eyes QHS   letrozole  2.5 mg Oral Daily   levothyroxine  112 mcg Oral Q0600   magic mouthwash  5 mL Oral  TID   metoprolol succinate  50 mg Oral Daily   multivitamin  1 tablet Oral QHS   OLANZapine zydis  2.5 mg Oral QHS   [START ON 07/28/2021] thiamine injection  100 mg Intravenous Daily   Continuous Infusions:  dextrose     [START ON 07/27/2021] feeding supplement (OSMOLITE 1.5 CAL)     lacosamide (VIMPAT) IV 100 mg (07/26/21 1106)   thiamine injection 250 mg (07/26/21 1149)     LOS: 9 days    Time spent: 25 mins.More than 50% of that time was spent in counseling and/or coordination of care.   Bonnell Public, MD [facility] P8/21/2022, 12:46 PM

## 2021-07-26 NOTE — Plan of Care (Signed)
  Problem: Education: Goal: Knowledge of General Education information will improve Description: Including pain rating scale, medication(s)/side effects and non-pharmacologic comfort measures Outcome: Progressing   Problem: Health Behavior/Discharge Planning: Goal: Ability to manage health-related needs will improve Outcome: Progressing   Problem: Clinical Measurements: Goal: Ability to maintain clinical measurements within normal limits will improve Outcome: Progressing Goal: Will remain free from infection Outcome: Progressing Goal: Diagnostic test results will improve Outcome: Progressing Goal: Cardiovascular complication will be avoided Outcome: Progressing   Problem: Activity: Goal: Risk for activity intolerance will decrease Outcome: Progressing   Problem: Nutrition: Goal: Adequate nutrition will be maintained Outcome: Progressing   Problem: Coping: Goal: Level of anxiety will decrease Outcome: Progressing   Problem: Elimination: Goal: Will not experience complications related to bowel motility Outcome: Progressing Goal: Will not experience complications related to urinary retention Outcome: Progressing   Problem: Pain Managment: Goal: General experience of comfort will improve Outcome: Progressing   Problem: Safety: Goal: Ability to remain free from injury will improve Outcome: Progressing   Problem: Skin Integrity: Goal: Risk for impaired skin integrity will decrease Outcome: Progressing   Problem: Education: Goal: Expressions of having a comfortable level of knowledge regarding the disease process will increase Outcome: Progressing   Problem: Coping: Goal: Ability to adjust to condition or change in health will improve Outcome: Progressing Goal: Ability to identify appropriate support needs will improve Outcome: Progressing   Problem: Health Behavior/Discharge Planning: Goal: Compliance with prescribed medication regimen will improve Outcome:  Progressing   Problem: Medication: Goal: Risk for medication side effects will decrease Outcome: Progressing   Problem: Clinical Measurements: Goal: Complications related to the disease process, condition or treatment will be avoided or minimized Outcome: Progressing Goal: Diagnostic test results will improve Outcome: Progressing   Problem: Safety: Goal: Verbalization of understanding the information provided will improve Outcome: Progressing   Problem: Self-Concept: Goal: Level of anxiety will decrease Outcome: Progressing Goal: Ability to verbalize feelings about condition will improve Outcome: Progressing   Problem: Safety: Goal: Non-violent Restraint(s) Outcome: Progressing

## 2021-07-27 ENCOUNTER — Inpatient Hospital Stay (HOSPITAL_COMMUNITY): Payer: Medicare (Managed Care)

## 2021-07-27 LAB — CBC
HCT: 31.9 % — ABNORMAL LOW (ref 36.0–46.0)
Hemoglobin: 9.8 g/dL — ABNORMAL LOW (ref 12.0–15.0)
MCH: 28.3 pg (ref 26.0–34.0)
MCHC: 30.7 g/dL (ref 30.0–36.0)
MCV: 92.2 fL (ref 80.0–100.0)
Platelets: 177 10*3/uL (ref 150–400)
RBC: 3.46 MIL/uL — ABNORMAL LOW (ref 3.87–5.11)
RDW: 15.2 % (ref 11.5–15.5)
WBC: 5.5 10*3/uL (ref 4.0–10.5)
nRBC: 0 % (ref 0.0–0.2)

## 2021-07-27 LAB — GLUCOSE, CAPILLARY
Glucose-Capillary: 108 mg/dL — ABNORMAL HIGH (ref 70–99)
Glucose-Capillary: 71 mg/dL (ref 70–99)
Glucose-Capillary: 82 mg/dL (ref 70–99)
Glucose-Capillary: 86 mg/dL (ref 70–99)
Glucose-Capillary: 89 mg/dL (ref 70–99)
Glucose-Capillary: 174 mg/dL — ABNORMAL HIGH (ref 70–99)
Glucose-Capillary: 203 mg/dL — ABNORMAL HIGH (ref 70–99)
Glucose-Capillary: 203 mg/dL — ABNORMAL HIGH (ref 70–99)

## 2021-07-27 LAB — URINALYSIS, ROUTINE W REFLEX MICROSCOPIC
Bilirubin Urine: NEGATIVE
Glucose, UA: NEGATIVE mg/dL
Ketones, ur: NEGATIVE mg/dL
Nitrite: NEGATIVE
Protein, ur: 100 mg/dL — AB
Specific Gravity, Urine: 1.003 — ABNORMAL LOW (ref 1.005–1.030)
pH: 8 (ref 5.0–8.0)

## 2021-07-27 LAB — RENAL FUNCTION PANEL
Albumin: 2.1 g/dL — ABNORMAL LOW (ref 3.5–5.0)
Anion gap: 7 (ref 5–15)
BUN: 32 mg/dL — ABNORMAL HIGH (ref 8–23)
CO2: 24 mmol/L (ref 22–32)
Calcium: 8.2 mg/dL — ABNORMAL LOW (ref 8.9–10.3)
Chloride: 98 mmol/L (ref 98–111)
Creatinine, Ser: 6.51 mg/dL — ABNORMAL HIGH (ref 0.44–1.00)
GFR, Estimated: 6 mL/min — ABNORMAL LOW (ref >60)
Glucose, Bld: 113 mg/dL — ABNORMAL HIGH (ref 70–99)
Phosphorus: 4.5 mg/dL (ref 2.5–4.6)
Potassium: 3.9 mmol/L (ref 3.5–5.1)
Sodium: 129 mmol/L — ABNORMAL LOW (ref 135–145)

## 2021-07-27 LAB — MAGNESIUM
Magnesium: 2.1 mg/dL (ref 1.7–2.4)
Magnesium: 2 mg/dL (ref 1.7–2.4)

## 2021-07-27 LAB — PHOSPHORUS
Phosphorus: 4.6 mg/dL (ref 2.5–4.6)
Phosphorus: 2.5 mg/dL (ref 2.5–4.6)

## 2021-07-27 MED ORDER — NEPRO/CARBSTEADY PO LIQD: 237.0000 mL | Freq: Three times a day (TID) | ORAL | Status: DC

## 2021-07-27 MED ORDER — HALOPERIDOL LACTATE 5 MG/ML IJ SOLN
2.0000 mg | Freq: Once | INTRAMUSCULAR | Status: AC | PRN
  Administered 2021-07-27: 2 mg via INTRAVENOUS
  Filled 2021-07-27: qty 1

## 2021-07-27 MED ORDER — HEPARIN SODIUM (PORCINE) 1000 UNIT/ML IJ SOLN
INTRAMUSCULAR | Status: AC
  Filled 2021-07-27: qty 2

## 2021-07-27 MED ORDER — BOOST / RESOURCE BREEZE PO LIQD CUSTOM
1.0000 | Freq: Three times a day (TID) | ORAL | Status: DC
  Administered 2021-07-27 – 2021-07-28 (×4): 1 via ORAL
  Administered 2021-07-28: 237 mL via ORAL
  Administered 2021-07-29 – 2021-07-31 (×7): 1 via ORAL

## 2021-07-27 NOTE — Progress Notes (Signed)
OT Cancellation Note  Patient Details Name: Kirsten Mcmahon MRN: [id number] DOB: 03-Mar-1946   Cancelled Treatment:    Reason Eval/Treat Not Completed: Patient at procedure or test/ unavailable pt is at HD.   Will return as time allows and pt is appropriate.   [facility] OTR/L Acute Rehabilitation Services Office: [place] 07/27/2021, 9:47 AM

## 2021-07-27 NOTE — Progress Notes (Signed)
[facility] PROGRESS NOTE    Kirsten Mcmahon  YCX:[id number] DOB: 06/11/1946 DOA: 07/15/2021 PCP: [facility]      Brief Narrative:  Mrs. Kirsten Mcmahon is a 75 y.o. F with dementia, ESRD, breast cancer on letrozole, stroke, DM, and history of seizures who came after seizure-like activity during hemodialysis.        Assessment & Plan:  Acute metabolic encephalopathy Waxing and waning agitation and confusion.  Infectious work-up negative.  -Consult neurology, appreciate recommendations  Seizure disorder - See EEG - Continue Vimpat - Consult neurology, appreciate recommendations  Dementia - Continue olanzapine  Breast cancer - Continue letrozole  ESRD -Consult nephrology for maintenance dialysis, appreciate recommendations  Hypothyroidism -Continue levothyroxine  Hypertension -Continue metoprolol, aspirin  Stage II pressure injury, mid buttocks, present on admission    Anemia of CKD        Disposition: Status is: Inpatient  Remains inpatient appropriate because:IV treatments appropriate due to intensity of illness or inability to take PO  Dispo: The patient is from: SNF              Anticipated d/c is to: SNF              Patient currently is not medically stable to d/c.   Difficult to place patient No       Level of care: Telemetry Medical       MDM: The below labs and imaging reports were reviewed and summarized above.  Medication management as above.    DVT prophylaxis: heparin injection 5,000 Units Start: 07/16/21 1400  Code Status: full           Subjective: No complaints.  Denies headache, chest pain, abdominal pain, vomiting.  Reports she feels at baseline.  Objective: Vitals:   07/27/21 1230 07/27/21 1300 07/27/21 1310 07/27/21 1601  BP: 125/70 124/69 138/69 134/83  Pulse: 79 74 76 80  Resp:    18  Temp:   98 F (36.7 C) (!) 97.5 F (36.4 C)  TempSrc:   Oral Axillary   SpO2:   98% 100%  Weight:   55.5 kg     Intake/Output Summary (Last 24 hours) at 07/27/2021 1948 Last data filed at 07/27/2021 1310 Gross per 24 hour  Intake 150 ml  Output 1000 ml  Net -850 ml   Filed Weights   07/27/21 0500 07/27/21 0940 07/27/21 1310  Weight: 56.9 kg 56.5 kg 55.5 kg    Examination: General appearance:  adult female, alert and in no obvious distress.   HEENT: Anicteric, conjunctiva pink, lids and lashes normal. No nasal deformity, discharge, epistaxis.  Hirsutism.  Oropharynx moist, no oral lesions Skin: Warm and dry.  no jaundice.  No suspicious rashes or lesions. Cardiac: RRR, nl S1-S2, no murmurs appreciated.  Capillary refill is brisk.  No LE edema.  Radial  pulses 2+ and symmetric. Respiratory: Normal respiratory rate and rhythm.  CTAB without rales or wheezes. Abdomen: Abdomen soft.  no TTP. No ascites, distension, hepatosplenomegaly.   Neuro: Awake and alert.  EOMI, moves all extremities.  Generalized weakness. Speech fluent.    Psych: Sensorium intact and responding to questions, oriented to self only, attention seems diminished, affect blunted, judgment insight appear impaired   Data Reviewed: I have personally reviewed following labs and imaging studies:  CBC: Recent Labs  Lab 07/22/21 0934 07/24/21 0735 07/27/21 0945  WBC 5.6 4.6 5.5  HGB 10.4* 10.4* 9.8*  HCT 34.7* 33.6* 31.9*  MCV 94.8 93.9  92.2  PLT 190 146* 856   Basic Metabolic Panel: Recent Labs  Lab 07/22/21 0934 07/24/21 0735 07/26/21 0915 07/26/21 1635 07/27/21 0716 07/27/21 0945 07/27/21 1753  NA 135 132*  --   --   --  129*  --   K 4.1 4.0  --   --   --  3.9  --   CL 100 99  --   --   --  98  --   CO2 25 25  --   --   --  24  --   GLUCOSE 69* 73  --   --   --  113*  --   BUN 15 17  --   --   --  32*  --   CREATININE 4.77* 5.28*  --   --   --  6.51*  --   CALCIUM 8.5* 8.4*  --   --   --  8.2*  --   MG  --   --  2.0 2.1 2.1  --  2.0  PHOS 4.3 4.4 4.3 4.0 4.6 4.5 2.5    GFR: Estimated Creatinine Clearance: 5.7 mL/min (A) (by C-G formula based on SCr of 6.51 mg/dL (H)). Liver Function Tests: Recent Labs  Lab 07/22/21 0934 07/24/21 0735 07/27/21 0945  ALBUMIN 2.5* 2.4* 2.1*   No results for input(s): LIPASE, AMYLASE in the last 168 hours. Recent Labs  Lab 07/21/21 1221  AMMONIA 21   Coagulation Profile: No results for input(s): INR, PROTIME in the last 168 hours. Cardiac Enzymes: No results for input(s): CKTOTAL, CKMB, CKMBINDEX, TROPONINI in the last 168 hours. BNP (last 3 results) No results for input(s): PROBNP in the last 8760 hours. HbA1C: No results for input(s): HGBA1C in the last 72 hours. CBG: Recent Labs  Lab 07/27/21 0458 07/27/21 0756 07/27/21 0944 07/27/21 1340 07/27/21 1606  GLUCAP 108* 71 82 86 89   Lipid Profile: No results for input(s): CHOL, HDL, LDLCALC, TRIG, CHOLHDL, LDLDIRECT in the last 72 hours. Thyroid Function Tests: No results for input(s): TSH, T4TOTAL, FREET4, T3FREE, THYROIDAB in the last 72 hours. Anemia Panel: No results for input(s): VITAMINB12, FOLATE, FERRITIN, TIBC, IRON, RETICCTPCT in the last 72 hours. Urine analysis:    Component Value Date/Time   COLORURINE YELLOW 05/17/2021 0808   APPEARANCEUR CLEAR 05/17/2021 0808   LABSPEC 1.011 05/17/2021 0808   PHURINE 9.0 (H) 05/17/2021 0808   GLUCOSEU 50 (A) 05/17/2021 0808   HGBUR NEGATIVE 05/17/2021 0808   BILIRUBINUR NEGATIVE 05/17/2021 0808   KETONESUR 5 (A) 05/17/2021 0808   PROTEINUR >=300 (A) 05/17/2021 0808   NITRITE NEGATIVE 05/17/2021 0808   LEUKOCYTESUR NEGATIVE 05/17/2021 0808   Sepsis Labs: @LABRCNTIP (procalcitonin:4,lacticacidven:4)  ) Recent Results (from the past 240 hour(s))  Culture, blood (routine x 2)     Status: None (Preliminary result)   Collection Time: 07/25/21  4:38 PM   Specimen: BLOOD RIGHT HAND  Result Value Ref Range Status   Specimen Description BLOOD RIGHT HAND  Final   Special Requests   Final     BOTTLES DRAWN AEROBIC AND ANAEROBIC Blood Culture results may not be optimal due to an inadequate volume of blood received in culture bottles   Culture   Final    NO GROWTH 2 DAYS Performed at [facility], [place] [address], [place], [place] [address]    Report Status PENDING  Incomplete  Culture, blood (routine x 2)     Status: None (Preliminary result)   Collection Time:  07/25/21  4:42 PM   Specimen: BLOOD  Result Value Ref Range Status   Specimen Description BLOOD RIGHT ANTECUBITAL  Final   Special Requests   Final    BOTTLES DRAWN AEROBIC AND ANAEROBIC Blood Culture adequate volume   Culture   Final    NO GROWTH 2 DAYS Performed at [facility], [address], [place], [place] [address]    Report Status PENDING  Incomplete         Radiology Studies: No results found.      Scheduled Meds:  (feeding supplement) PROSource Plus  30 mL Oral TID BM   aspirin  81 mg Oral Daily   calcitRIOL  0.5 mcg Oral Q M,W,F-HD   Chlorhexidine Gluconate Cloth  6 each Topical Q0600   feeding supplement  1 Container Oral TID BM   heparin  5,000 Units Subcutaneous Q8H   heparin sodium (porcine)       latanoprost  1 drop Both Eyes QHS   letrozole  2.5 mg Oral Daily   levothyroxine  112 mcg Oral Q0600   magic mouthwash  5 mL Oral TID   metoprolol succinate  50 mg Oral Daily   multivitamin  1 tablet Oral QHS   OLANZapine zydis  2.5 mg Oral QHS   [START ON 07/28/2021] thiamine injection  100 mg Intravenous Daily   Continuous Infusions:  dextrose 50 mL/hr at 07/27/21 0105   lacosamide (VIMPAT) IV 100 mg (07/27/21 1551)     LOS: 10 days    Time spent: 25 minutes    Edwin Dada, MD [facility] 07/27/2021, 7:48 PM     Please page though [place] or Epic secure chat:  For [employer], Adult nurse

## 2021-07-27 NOTE — Progress Notes (Signed)
Patient combative and aggressive. Patient scratching and attempting to bite staff. Patient refused oral medication and threw cup of boost breeze with pro source. This RN requested assistance from MD. MD stated would come to the bedside. Currently waiting.

## 2021-07-27 NOTE — Progress Notes (Signed)
PT Cancellation Note  Patient Details Name: Kirsten Mcmahon MRN: [id number] DOB: [birth year]   Cancelled Treatment:    Reason Eval/Treat Not Completed: Patient at procedure or test/unavailable (HD)  Will check back as time allows.   Leighton Roach, PT  Acute Rehab Services  Pager [phone] Office [place] 07/27/2021, 9:40 AM

## 2021-07-27 NOTE — Progress Notes (Signed)
Patient left for dialysis in bed. Patient was more sleepy than normal. This RN was uncomfortable moving the patient. This RN made dialysis nurse aware of recent BS issues. This RN made dialysis RN aware to check CBG and either follow hypoglycemic protocol or reach out to me by phone to provide further instruction.

## 2021-07-27 NOTE — Progress Notes (Addendum)
Nutrition Follow-up  DOCUMENTATION CODES:   Not applicable  INTERVENTION:   -Boost Breeze po TID, each supplement provides 250 kcal and 9 grams of protein  -Continue 30 ml Prosource Plus TID, each supplement provides 100 kcals and 15 grams protein -Continue Magic cup TID with meals, each supplement provides 290 kcal and 9 grams of protein  -Continue renal MVI daily -Continue feeding assistance with meals  NUTRITION DIAGNOSIS:   Inadequate oral intake related to decreased appetite as evidenced by meal completion < 50%.  Ongoing  GOAL:   Patient will meet greater than or equal to 90% of their needs  Progressing   MONITOR:   PO intake, Supplement acceptance, Diet advancement, Labs, Weight trends, TF tolerance, Skin, I & O's  REASON FOR ASSESSMENT:   Consult Enteral/tube feeding initiation and management  ASSESSMENT:   75 year old female with past medical history of hypothyroidism, remote history of breast cancer (2017), end-stage renal disease (MWF HD), previous strokes (right MCA stroke in 1998, and recent multifocal punctate strokes in 05/2021), seizure disorder (Dx 01/2021), diabetes mellitus type 2 (hgb A1C 5.6 05/2021) strep bacteremia (treated with IV vancomycin with hemodialysis, 04/2021) who presents to [facility] emergency department via EMS after patient exhibited seizure-like activity during hemodialysis.  8/19- s/p BSE- advanced to dysphagia 3 diet with thin liquids  Reviewed I/O's: +455 ml x 24 hours and +2.3 L since admission  UOP: 100 ml x 24 hours  Pt unavailable at time of visit.  Per MD notes, pt family refusing cortrak tube placement.   Observed meal tray, which was unattempted. Palliative care continues to follow for goals of care discussions.    Medications reviewed and include thiamine, vimpat, and dextrose 10% infusion @ 50 ml/hr.   Labs reviewed: Na: 129, CBGS: 71-108.   Diet Order:   Diet Order             DIET DYS 3 Room service  appropriate? No; Fluid consistency: Thin; Fluid restriction: 1200 mL Fluid  Diet effective now                   EDUCATION NEEDS:   Not appropriate for education at this time  Skin:  Skin Assessment: Skin Integrity Issues: Skin Integrity Issues:: Stage II Stage II: buttocks  Last BM:  07/26/21  Height:   Ht Readings from Last 1 Encounters:  05/18/21 5\' 2"  (1.575 m)    Weight:   Wt Readings from Last 1 Encounters:  07/27/21 56.5 kg    Ideal Body Weight:  50 kg  BMI:  Body mass index is 22.78 kg/m.  Estimated Nutritional Needs:   Kcal:  1700-1900  Protein:  85-100 grams  Fluid:  1000 ml + UOP    [name], RD, LDN, CDCES Registered Dietitian II Certified Diabetes Care and Education Specialist Please refer to [facility] for RD and/or RD on-call/weekend/after hours pager

## 2021-07-27 NOTE — Progress Notes (Signed)
EEG came and attempted to hook patient up with two techs. Patient would not allow [place] to touch her patient grabbed tech, scratching and telling us we could not touch her head and wouldn't let us.

## 2021-07-27 NOTE — Progress Notes (Signed)
[facility] Progress Note  Subjective: seen on HD, pt is confused, arousable, pleasant  Vitals:   07/27/21 1200 07/27/21 1230 07/27/21 1300 07/27/21 1310  BP: 125/71 125/70 124/69 138/69  Pulse: 77 79 74 76  Resp:      Temp:    98 F (36.7 C)  TempSrc:    Oral  SpO2:    98%  Weight:    55.5 kg    Exam: General: frail-appearing, opens eyes and mumbles today Lungs: clear bilaterally Heart: RRR. No murmur, rubs or gallops.  Abdomen: soft, nontender, +BS Lower extremities:no edema  Neuro: opens eyes , "1996", we are at "[facility]" Dialysis Access: TDC    OP HD: [facility] MWF   3h 30min  400/1.5  3K/2.5 bath  TDC  Hep none  -Mircera 150 mcg IV Q 2 weeks (last dose 07/15/2021) -Venofer 50 mg IV weekly (last dose 07/15/2021) -Calcitriol 0.5 mcg PO TIW   Assessment/ Plan: Seizure-experienced a seizure during her last outpatient HD on 07/15/21.  Neurology following.  Was on depakote but became somnolent- now on vimpat and MS got better then declined again.  Altered Mental Status - remains confused. CT head (-) acute intracranial abnormalities x 2, last 07/20/21.  Neurology on board.  Workup negative thus far- ? Delirium.  She is not an appropriate candidate for outpatient dialysis in this state. ESRD - on HD MWF. Next HD today. Hypertension/volume  - Patient euvolemic on exam, Bps improving-continue HTN medication-monitor BP and HR. Anemia of CKD - Hgb 9.4-recently received ESA and Fe in outpatient-monitor trends Secondary Hyperparathyroidism -  Ca 8.5-continue PO calcitriol.  Nutrition - Continue carb-modified renal diet. Albumin 2.6-continue protein supplements. Dispo: overall poor prognosis--> appreciate palliative care c/s     Rob Denario Bagot 07/27/2021, 1:45 PM   Recent Labs  Lab 07/24/21 0735 07/26/21 0915 07/27/21 0716 07/27/21 0945  K 4.0  --   --  3.9  BUN 17  --   --  32*  CREATININE 5.28*  --   --  6.51*  CALCIUM 8.4*  --   --  8.2*  PHOS 4.4   < >  4.6 4.5  HGB 10.4*  --   --  9.8*   < > = values in this interval not displayed.   Inpatient medications:  (feeding supplement) PROSource Plus  30 mL Oral TID BM   aspirin  81 mg Oral Daily   calcitRIOL  0.5 mcg Oral Q M,W,F-HD   Chlorhexidine Gluconate Cloth  6 each Topical Q0600   feeding supplement  1 Container Oral TID BM   heparin  5,000 Units Subcutaneous Q8H   heparin sodium (porcine)       latanoprost  1 drop Both Eyes QHS   letrozole  2.5 mg Oral Daily   levothyroxine  112 mcg Oral Q0600   magic mouthwash  5 mL Oral TID   metoprolol succinate  50 mg Oral Daily   multivitamin  1 tablet Oral QHS   OLANZapine zydis  2.5 mg Oral QHS   [START ON 07/28/2021] thiamine injection  100 mg Intravenous Daily    dextrose 50 mL/hr at 07/27/21 0105   lacosamide (VIMPAT) IV Stopped (07/27/21 1000)   thiamine injection Stopped (07/27/21 1000)   acetaminophen **OR** acetaminophen, labetalol, ondansetron **OR** ondansetron (ZOFRAN) IV, polyethylene glycol

## 2021-07-27 NOTE — Progress Notes (Signed)
LTM EEG hooked up and running - no initial skin breakdown - push button tested - neuro notified. [facility] monitoring.  

## 2021-07-27 NOTE — Progress Notes (Signed)
MD ordered Haldol and wrist restraints for patient. EEG came to set up and medication and restraints were applied. No signs of injury. Food and fluid declined. External urinary device present and patent. Will update night RN

## 2021-07-28 DIAGNOSIS — R4182 Altered mental status, unspecified: Secondary | ICD-10-CM

## 2021-07-28 LAB — BASIC METABOLIC PANEL
Anion gap: 9 (ref 5–15)
BUN: 19 mg/dL (ref 8–23)
CO2: 26 mmol/L (ref 22–32)
Calcium: 7.9 mg/dL — ABNORMAL LOW (ref 8.9–10.3)
Chloride: 95 mmol/L — ABNORMAL LOW (ref 98–111)
Creatinine, Ser: 4.46 mg/dL — ABNORMAL HIGH (ref 0.44–1.00)
GFR, Estimated: 10 mL/min — ABNORMAL LOW (ref >60)
Glucose, Bld: 68 mg/dL — ABNORMAL LOW (ref 70–99)
Potassium: 4.7 mmol/L (ref 3.5–5.1)
Sodium: 130 mmol/L — ABNORMAL LOW (ref 135–145)

## 2021-07-28 LAB — GLUCOSE, CAPILLARY
Glucose-Capillary: 102 mg/dL — ABNORMAL HIGH (ref 70–99)
Glucose-Capillary: 116 mg/dL — ABNORMAL HIGH (ref 70–99)
Glucose-Capillary: 73 mg/dL (ref 70–99)
Glucose-Capillary: 79 mg/dL (ref 70–99)
Glucose-Capillary: 97 mg/dL (ref 70–99)
Glucose-Capillary: 98 mg/dL (ref 70–99)

## 2021-07-28 LAB — CBC
HCT: 34.4 % — ABNORMAL LOW (ref 36.0–46.0)
Hemoglobin: 10.6 g/dL — ABNORMAL LOW (ref 12.0–15.0)
MCH: 28.3 pg (ref 26.0–34.0)
MCHC: 30.8 g/dL (ref 30.0–36.0)
MCV: 91.7 fL (ref 80.0–100.0)
Platelets: 119 10*3/uL — ABNORMAL LOW (ref 150–400)
RBC: 3.75 MIL/uL — ABNORMAL LOW (ref 3.87–5.11)
RDW: 15.2 % (ref 11.5–15.5)
WBC: 7.2 10*3/uL (ref 4.0–10.5)
nRBC: 0 % (ref 0.0–0.2)

## 2021-07-28 LAB — VITAMIN B1: Vitamin B1 (Thiamine): 192.9 nmol/L (ref 66.5–200.0)

## 2021-07-28 NOTE — Plan of Care (Signed)
  Problem: Education: Goal: Knowledge of General Education information will improve Description: Including pain rating scale, medication(s)/side effects and non-pharmacologic comfort measures Outcome: Not Progressing   Problem: Health Behavior/Discharge Planning: Goal: Ability to manage health-related needs will improve Outcome: Not Progressing   Problem: Clinical Measurements: Goal: Ability to maintain clinical measurements within normal limits will improve Outcome: Not Progressing Goal: Will remain free from infection Outcome: Not Progressing Goal: Diagnostic test results will improve Outcome: Not Progressing Goal: Cardiovascular complication will be avoided Outcome: Not Progressing   Problem: Activity: Goal: Risk for activity intolerance will decrease Outcome: Not Progressing   Problem: Nutrition: Goal: Adequate nutrition will be maintained Outcome: Not Progressing   Problem: Coping: Goal: Level of anxiety will decrease Outcome: Not Progressing   Problem: Elimination: Goal: Will not experience complications related to bowel motility Outcome: Not Progressing Goal: Will not experience complications related to urinary retention Outcome: Not Progressing   Problem: Pain Managment: Goal: General experience of comfort will improve Outcome: Not Progressing   Problem: Safety: Goal: Ability to remain free from injury will improve Outcome: Not Progressing   Problem: Skin Integrity: Goal: Risk for impaired skin integrity will decrease Outcome: Not Progressing   Problem: Education: Goal: Expressions of having a comfortable level of knowledge regarding the disease process will increase Outcome: Not Progressing   Problem: Coping: Goal: Ability to adjust to condition or change in health will improve Outcome: Not Progressing Goal: Ability to identify appropriate support needs will improve Outcome: Not Progressing   Problem: Health Behavior/Discharge Planning: Goal:  Compliance with prescribed medication regimen will improve Outcome: Not Progressing   Problem: Medication: Goal: Risk for medication side effects will decrease Outcome: Not Progressing   Problem: Clinical Measurements: Goal: Complications related to the disease process, condition or treatment will be avoided or minimized Outcome: Not Progressing Goal: Diagnostic test results will improve Outcome: Not Progressing   Problem: Safety: Goal: Verbalization of understanding the information provided will improve Outcome: Not Progressing   Problem: Self-Concept: Goal: Level of anxiety will decrease Outcome: Not Progressing Goal: Ability to verbalize feelings about condition will improve Outcome: Not Progressing   Problem: Safety: Goal: Non-violent Restraint(s) Outcome: Not Progressing

## 2021-07-28 NOTE — Procedures (Addendum)
Patient Name: ROSABELLE JUPIN  MRN: [id number]  Epilepsy Attending: Lora Havens  Referring Physician/Provider: Dr Roland Rack Duration: 07/27/2021 1846 to 07/28/2021 1755   Patient history: 75 yo female who presented with breakthrough seizure provoked by missing Keppra dose.  EEG to evaluate for seizures.   Level of alertness: Awake, asleep   AEDs during EEG study: LCM   Technical aspects: This EEG study was done with scalp electrodes positioned according to the 10-20 International system of electrode placement. Electrical activity was acquired at a sampling rate of 500Hz  and reviewed with a high frequency filter of 70Hz  and a low frequency filter of 1Hz . EEG data were recorded continuously and digitally stored.    Description: No posterior dominant rhythm was seen.  Sleep was characterized by vertex waves, sleep spindles (12-14 Hz, maximal frontocentral region. EEG showed continuous generalized and lateralized right hemisphere 3 to 6 Hz theta-delta slowing. There are also sharply contoured waves in the right centro-parietal region consistent with underlying breach artifact.  Hyperventilation and photic stimulation were not performed.      ABNORMALITY -Breach artifact, right centro-parietal region -Continuous slow, generalized and lateralized right hemisphere   IMPRESSION: This study is suggestive of cortical dysfunction in right centro-parietal region consistent with prior craniotomy and underlying stroke.there is also moderate degree of encephalopathy, nonspecific etiology.  No seizures or definite epileptiform discharges were seen throughout the recording.  Avelardo Reesman [name]

## 2021-07-28 NOTE — Progress Notes (Signed)
SLP Cancellation Note  Patient Details Name: Kirsten Mcmahon MRN: [id number] DOB: 11-Sep-1946   Cancelled treatment:       Reason Eval/Treat Not Completed: Other (comment);Fatigue/lethargy limiting ability to participate (per RN, pt received Haldol at shift change due to agitation, pt lethargic at this time; Will continue efforts)  Kathleen Lime, MS [facility] SLP Acute Rehab Services Office [phone] Pager [phone]   Macario Golds 07/28/2021, 10:38 AM

## 2021-07-28 NOTE — Progress Notes (Signed)
[facility] PROGRESS NOTE    MAVERICK PATMAN  HQI:[id number] DOB: 1946-09-14 DOA: 07/15/2021 PCP: [facility]      Brief Narrative:  Mrs. Peer is a 75 y.o. F with dementia, ESRD, breast cancer on letrozole, stroke, DM, and history of seizures who came after seizure-like activity during hemodialysis.  In the ER, was loaded with Keppra, then evaluated by Neurology.  Somnolent at that time, but appeared to have had a breakthrough seizure in the setting of a missed dose that morning, and to be post-ictal in combination with sleepy from [place].  It was anticipated that she would gradually wake up over the course of a few days and return to baseline (forgetful, but able to feed self, interactive, participates in self cares).  8/10: admitted and loaded with Keppra 8/10 - 8/12: combative, attributed to [place], switched to Depakote 8/13 - 8/15: progressively encephalopathic, somnolent, EEG negative, CTH unremarkable 8/16: empiric thiamine IV started 8/17: due to somnolence considered as a side effect of Depakote, this was changed to Vimpat 8/18-8/22: mentation waxing and waning     Assessment & Plan:  Acute metabolic encephalopathy due to delirium superimposed on dementia At baseline, MMSE 16/30, but able to feed self, participate in self cares.  No agitation at baseline.    See the above timelines for the patient's waxing and waning encephalopathy.  This has a pattern of delirium, although no clear cause (certainly no seizure, intracranial abnormality, stroke or infection), and presumably is from the hospital setting given her dementia.  - Consult Palliative Care - Stop olanzapine, which was trialed here for agitation, but as she seems sensitive to sedating medications  Delirium precautions:   -Lights and TV off, minimize interruptions at night  -Blinds open and lights on during day  -Glasses/hearing aid with patient  -Frequent  reorientation  -PT/OT when able  -Avoid sedation medications/[place] list medications   STRICTLY AVOID HALDOL OR BENZODIAZEPINES, unless the latter for seizures     Seizure disorder On Keppra PTA.  Missed a dose, had a seizure in dialysis.  Now stable on Vimpat, no further seizure activity here.  - Continue Vimpat   Hypoglycemia Due to poor oral intake.  Has required D10 infusion. - Trial off dextrose tomorrow   Breast cancer - Continue letrozole  ESRD -Consult nephrology for maintenance dialysis, appreciate recommendations  Hypothyroidism - Continue levothyroxine  Hypertension -Continue metoprolol statin  Stage II pressure injury, mid buttocks, present on admission   Anemia of CKD        Disposition: Status is: Inpatient  Remains inpatient appropriate because:IV treatments appropriate due to intensity of illness or inability to take PO  Dispo: The patient is from: SNF              Anticipated d/c is to: SNF              Patient currently is not medically stable to d/c.   Difficult to place patient No    Patient admitted after seizure.  Her stay here has been complicated by prolonged delirium. Extensive work up for secondary causes of delirium has been completed.    At this time, we should advance her diet, and transition off dextrose infusion.  If this is accomplished safely, then subsequently Nephrology must discuss with family whether dialysis can be continued or not.   If outpatient HD can be continued, the patient is stable for discharge to [facility]ACE and home care.  If not, family would be  willing to discuss end of life care with Hospice.     Level of care: Telemetry Medical       MDM: The below labs and imaging reports were reviewed and summarized above.  Medication management as above.    DVT prophylaxis: heparin injection 5,000 Units Start: 07/16/21 1400  Code Status: full           Subjective: Patient is sleepy but arousable, makes  eye contact, denies complaints.  Oriented to self, otherwise disoriented.      Objective: Vitals:   07/28/21 0740 07/28/21 1116 07/28/21 1526 07/28/21 1942  BP: 108/72 131/68 128/71 136/69  Pulse: 72 71 71 72  Resp: 18 14 14 17   Temp: 98.3 F (36.8 C) 97.8 F (36.6 C) 98.4 F (36.9 C) 98 F (36.7 C)  TempSrc: Oral Oral Oral Oral  SpO2: 100% 100% 100% 97%  Weight:        Intake/Output Summary (Last 24 hours) at 07/28/2021 2017 Last data filed at 07/28/2021 1530 Gross per 24 hour  Intake 1991.48 ml  Output 400 ml  Net 1591.48 ml   Filed Weights   07/27/21 0500 07/27/21 0940 07/27/21 1310  Weight: 56.9 kg 56.5 kg 55.5 kg    Examination: General appearance:  adult female, alert and in no obvious distress.   HEENT: Anicteric, conjunctiva pink, lids and lashes normal. No nasal deformity, discharge, epistaxis.  Hirsutism.  Oropharynx moist, no oral lesions Skin: Warm and dry.  no jaundice.  No suspicious rashes or lesions. Cardiac: RRR, nl S1-S2, no murmurs appreciated.  Capillary refill is brisk.  No LE edema.  Radial  pulses 2+ and symmetric. Respiratory: Normal respiratory rate and rhythm.  CTAB without rales or wheezes. Abdomen: Abdomen soft.  no TTP. No ascites, distension, hepatosplenomegaly.   Neuro: Awake and alert.  EOMI, moves all extremities.  Generalized weakness. Speech fluent.    Psych: Sensorium intact and responding to questions, oriented to self only, attention seems diminished, affect blunted, judgment insight appear impaired   Data Reviewed: I have personally reviewed following labs and imaging studies:  CBC: Recent Labs  Lab 07/22/21 0934 07/24/21 0735 07/27/21 0945 07/28/21 0346  WBC 5.6 4.6 5.5 7.2  HGB 10.4* 10.4* 9.8* 10.6*  HCT 34.7* 33.6* 31.9* 34.4*  MCV 94.8 93.9 92.2 91.7  PLT 190 146* 177 010*   Basic Metabolic Panel: Recent Labs  Lab 07/22/21 0934 07/24/21 0735 07/26/21 0915 07/26/21 1635 07/27/21 0716 07/27/21 0945 07/27/21 1753  07/28/21 0346  NA 135 132*  --   --   --  129*  --  130*  K 4.1 4.0  --   --   --  3.9  --  4.7  CL 100 99  --   --   --  98  --  95*  CO2 25 25  --   --   --  24  --  26  GLUCOSE 69* 73  --   --   --  113*  --  68*  BUN 15 17  --   --   --  32*  --  19  CREATININE 4.77* 5.28*  --   --   --  6.51*  --  4.46*  CALCIUM 8.5* 8.4*  --   --   --  8.2*  --  7.9*  MG  --   --  2.0 2.1 2.1  --  2.0  --   PHOS 4.3 4.4 4.3 4.0 4.6 4.5 2.5  --  GFR: Estimated Creatinine Clearance: 8.4 mL/min (A) (by C-G formula based on SCr of 4.46 mg/dL (H)). Liver Function Tests: Recent Labs  Lab 07/22/21 0934 07/24/21 0735 07/27/21 0945  ALBUMIN 2.5* 2.4* 2.1*   No results for input(s): LIPASE, AMYLASE in the last 168 hours. No results for input(s): AMMONIA in the last 168 hours.  Coagulation Profile: No results for input(s): INR, PROTIME in the last 168 hours. Cardiac Enzymes: No results for input(s): CKTOTAL, CKMB, CKMBINDEX, TROPONINI in the last 168 hours. BNP (last 3 results) No results for input(s): PROBNP in the last 8760 hours. HbA1C: No results for input(s): HGBA1C in the last 72 hours. CBG: Recent Labs  Lab 07/28/21 0344 07/28/21 0737 07/28/21 1151 07/28/21 1545 07/28/21 1940  GLUCAP 73 116* 98 102* 97   Lipid Profile: No results for input(s): CHOL, HDL, LDLCALC, TRIG, CHOLHDL, LDLDIRECT in the last 72 hours. Thyroid Function Tests: No results for input(s): TSH, T4TOTAL, FREET4, T3FREE, THYROIDAB in the last 72 hours. Anemia Panel: No results for input(s): VITAMINB12, FOLATE, FERRITIN, TIBC, IRON, RETICCTPCT in the last 72 hours. Urine analysis:    Component Value Date/Time   COLORURINE YELLOW 07/27/2021 1907   APPEARANCEUR HAZY (A) 07/27/2021 1907   LABSPEC 1.003 (L) 07/27/2021 1907   PHURINE 8.0 07/27/2021 1907   GLUCOSEU NEGATIVE 07/27/2021 1907   HGBUR SMALL (A) 07/27/2021 [place] NEGATIVE 07/27/2021 [place] NEGATIVE 07/27/2021 1907   PROTEINUR  100 (A) 07/27/2021 1907   NITRITE NEGATIVE 07/27/2021 1907   LEUKOCYTESUR LARGE (A) 07/27/2021 1907   Sepsis Labs: @LABRCNTIP (procalcitonin:4,lacticacidven:4)  ) Recent Results (from the past 240 hour(s))  Culture, blood (routine x 2)     Status: None (Preliminary result)   Collection Time: 07/25/21  4:38 PM   Specimen: BLOOD RIGHT HAND  Result Value Ref Range Status   Specimen Description BLOOD RIGHT HAND  Final   Special Requests   Final    BOTTLES DRAWN AEROBIC AND ANAEROBIC Blood Culture results may not be optimal due to an inadequate volume of blood received in culture bottles   Culture   Final    NO GROWTH 3 DAYS Performed at [facility], [place] [address] [place], [place] [address]    Report Status PENDING  Incomplete  Culture, blood (routine x 2)     Status: None (Preliminary result)   Collection Time: 07/25/21  4:42 PM   Specimen: BLOOD  Result Value Ref Range Status   Specimen Description BLOOD RIGHT ANTECUBITAL  Final   Special Requests   Final    BOTTLES DRAWN AEROBIC AND ANAEROBIC Blood Culture adequate volume   Culture   Final    NO GROWTH 3 DAYS Performed at [facility], [place] [address] [place],  [address]    Report Status PENDING  Incomplete         Radiology Studies: Overnight EEG with video  Result Date: 07/28/2021 Lora Havens, MD     07/28/2021 10:07 AM Patient Name: NOELIE RENFROW MRN: [id number] Epilepsy Attending: Lora Havens Referring Physician/Provider: Dr Roland Rack Duration: 07/27/2021 1846 to 07/28/2021 1015  Patient history: 75 yo female who presented with breakthrough seizure provoked by missing Keppra dose.  EEG to evaluate for seizures.  Level of alertness: Awake, asleep  AEDs during EEG study: LCM  Technical aspects: This EEG study was done with scalp electrodes positioned according to the 10-20 International system of electrode placement. Electrical activity was acquired at a sampling rate  of 500Hz  and  reviewed with a high frequency filter of 70Hz  and a low frequency filter of 1Hz . EEG data were recorded continuously and digitally stored.  Description: No posterior dominant rhythm was seen.  Sleep was characterized by vertex waves, sleep spindles (12-14 Hz, maximal frontocentral region. EEG showed continuous generalized and lateralized right hemisphere 3 to 6 Hz theta-delta slowing. There are also sharply contoured waves in the right centro-parietal region consistent with underlying breach artifact.  Hyperventilation and photic stimulation were not performed.    ABNORMALITY -Breach artifact, right centro-parietal region -Continuous slow, generalized and lateralized right hemisphere  IMPRESSION: This study is suggestive of cortical dysfunction in right centro-parietal region consistent with prior craniotomy and underlying stroke.there is also moderate degree of encephalopathy, nonspecific etiology.  No seizures or definite epileptiform discharges were seen throughout the recording. [name]        Scheduled Meds:  (feeding supplement) PROSource Plus  30 mL Oral TID BM   aspirin  81 mg Oral Daily   calcitRIOL  0.5 mcg Oral Q M,W,F-HD   Chlorhexidine Gluconate Cloth  6 each Topical Q0600   feeding supplement  1 Container Oral TID BM   heparin  5,000 Units Subcutaneous Q8H   latanoprost  1 drop Both Eyes QHS   letrozole  2.5 mg Oral Daily   levothyroxine  112 mcg Oral Q0600   magic mouthwash  5 mL Oral TID   metoprolol succinate  50 mg Oral Daily   multivitamin  1 tablet Oral QHS   OLANZapine zydis  2.5 mg Oral QHS   thiamine injection  100 mg Intravenous Daily   Continuous Infusions:  dextrose 50 mL/hr at 07/28/21 2012   lacosamide (VIMPAT) IV 100 mg (07/28/21 1246)     LOS: 11 days    Time spent: 25 minutes    Edwin Dada, MD [facility] 07/28/2021, 8:17 PM     Please page though [place] or Epic secure chat:  For [employer], Adult nurse

## 2021-07-28 NOTE — Progress Notes (Signed)
[facility] Progress Note  Subjective: seen in room, no c/o   Vitals:   07/27/21 2323 07/28/21 0347 07/28/21 0740 07/28/21 1116  BP: 98/63 117/67 108/72 131/68  Pulse: 79 86 72 71  Resp: 17 18 18 14   Temp: 97.8 F (36.6 C) 98.4 F (36.9 C) 98.3 F (36.8 C) 97.8 F (36.6 C)  TempSrc: Oral Oral Oral Oral  SpO2: 96% 100% 100% 100%  Weight:        Exam: General: frail-appearing, opens eyes and responds minimally  Lungs: clear bilaterally Heart: RRR. No murmur, rubs or gallops.  Abdomen: soft, nontender, +BS Lower extremities:no edema  Neuro:  Ox 1 Dialysis Access: TDC    OP HD: G-O MWF   3h 71min  400/1.5  3K/2.5 bath  TDC  Hep none  -Mircera 150 mcg IV Q 2 weeks (last dose 07/15/2021) -Venofer 50 mg IV weekly (last dose 07/15/2021) -Calcitriol 0.5 mcg PO TIW   Assessment/ Plan: Seizure-experienced a seizure during her last outpatient HD on 07/15/21.  Neurology following.  Was on depakote but became somnolent- now on vimpat and MS got better then declined again.  Altered Mental Status - remains confused. CT head (-) acute intracranial abnormalities x 2, last 07/20/21.  Neurology on board.  Workup negative thus far.  She is not an appropriate candidate for outpatient dialysis in this state. ESRD - on HD MWF. HD tomorrow.  Hypertension/volume  - Patient euvolemic on exam, Bps improving-continue  Anemia of CKD - Hgb 9.4-recently received ESA and Fe in outpatient-monitor trends Secondary Hyperparathyroidism -  Ca 8.5-continue PO calcitriol.  Nutrition - Continue carb-modified renal diet. Albumin 2.6-continue protein supplements. Dispo: overall poor prognosis--> appreciate palliative care c/s     Rob Winry Egnew 07/28/2021, 2:02 PM   Recent Labs  Lab 07/27/21 0945 07/27/21 1753 07/28/21 0346  K 3.9  --  4.7  BUN 32*  --  19  CREATININE 6.51*  --  4.46*  CALCIUM 8.2*  --  7.9*  PHOS 4.5 2.5  --   HGB 9.8*  --  10.6*    Inpatient medications:  (feeding  supplement) PROSource Plus  30 mL Oral TID BM   aspirin  81 mg Oral Daily   calcitRIOL  0.5 mcg Oral Q M,W,F-HD   Chlorhexidine Gluconate Cloth  6 each Topical Q0600   feeding supplement  1 Container Oral TID BM   heparin  5,000 Units Subcutaneous Q8H   latanoprost  1 drop Both Eyes QHS   letrozole  2.5 mg Oral Daily   levothyroxine  112 mcg Oral Q0600   magic mouthwash  5 mL Oral TID   metoprolol succinate  50 mg Oral Daily   multivitamin  1 tablet Oral QHS   OLANZapine zydis  2.5 mg Oral QHS   thiamine injection  100 mg Intravenous Daily    dextrose 50 mL/hr at 07/28/21 0502   lacosamide (VIMPAT) IV 100 mg (07/28/21 1246)   acetaminophen **OR** acetaminophen, labetalol, ondansetron **OR** ondansetron (ZOFRAN) IV, polyethylene glycol

## 2021-07-28 NOTE — Progress Notes (Signed)
Physical Therapy Treatment Patient Details Name: Kirsten Mcmahon MRN: [id number] DOB: [birth year] Today's Date: 07/28/2021    History of Present Illness 75 y.o. female presenting from dialysis to ED 8/10 with breakthrough seizures after missed dose of keppra. Patient from home with PACE. Admission complicated by AMS, confusion and agitation. PMHx significant for HTN, HLD, DMII, stroke with residual left-sided weakness and extensor tone, wheelchair-bound, GERD, hypothyroidism, depression, breast cancer, CKD stage IIIb on HD.    PT Comments    Pt limited by lethargy this session. Was able to state name and answer a few other one word answer questions but very sleepy by end of session. Pt required tot A for bed mobility and coming to EOB. BLE's very stiff with knees ~75 deg flexed in sitting which throws balance posterior. Improved sitting balance with engagement of RUE in tasks. Based on current level of care needed, expect pt will need SNF level care at d/c. PT will continue to follow.     Follow Up Recommendations  SNF;Supervision/Assistance - 24 hour     Equipment Recommendations  Hospital bed    Recommendations for Other Services       Precautions / Restrictions Precautions Precautions: Fall Precaution Comments: HOH; wheelchair bound at baseline Restrictions Weight Bearing Restrictions: No    Mobility  Bed Mobility Overal bed mobility: Needs Assistance Bed Mobility: Supine to Sit;Sit to Supine     Supine to sit: Total assist;+2 for physical assistance;+2 for safety/equipment;HOB elevated Sit to supine: Total assist;+2 for physical assistance;+2 for safety/equipment   General bed mobility comments: totalA for all aspects of bed mobility    Transfers                 General transfer comment: deferred, too lethargic  Ambulation/Gait             General Gait Details: w/c at baseline   Stairs             Wheelchair Mobility    Modified Rankin  (Stroke Patients Only)       Balance Overall balance assessment: Needs assistance Sitting-balance support: No upper extremity supported;Feet supported Sitting balance-[name] Scale: Poor Sitting balance - Comments: minguard-minA for stability sitting EOB. pt with slight right posterior lean, required assistance to correct. improved postural control with engagement of RUE Postural control: Right lateral lean;Posterior lean                                  Cognition Arousal/Alertness: Lethargic Behavior During Therapy: Flat affect Overall Cognitive Status: No family/caregiver present to determine baseline cognitive functioning                                 General Comments: Pt follows 1-step verbal commands with <10% accuracy. Pt minimally verbal this date, did state name and answered a few questions.  Requiring max cues to keep eyes open at end of sensation.      Exercises General Exercises - Upper Extremity Shoulder Flexion: AAROM;PROM;Both;5 reps Shoulder Extension: AAROM;PROM;Both;5 reps Elbow Flexion: AAROM;PROM;Both;5 reps Elbow Extension: AAROM;PROM;Both;5 reps Wrist Flexion: AAROM;PROM;Both;5 reps Wrist Extension: AAROM;PROM;Both;5 reps    General Comments General comments (skin integrity, edema, etc.): VSS, continuous EEG active during session. PT with increased stiffness BLE's, L>R, knees maintained ~75 deg flexion in sitting which causes her to lean posterior      Pertinent Vitals/Pain  Pain Assessment: Faces Faces Pain Scale: Hurts a little bit Pain Location: LUE with ROM Pain Descriptors / Indicators: Grimacing Pain Intervention(s): Limited activity within patient's tolerance    Home Living                      Prior Function            PT Goals (current goals can now be found in the care plan section) Acute Rehab PT Goals Patient Stated Goal: Unable to state PT Goal Formulation: Patient unable to participate in goal  setting Time For Goal Achievement: 08/06/21 Potential to Achieve Goals: Fair Progress towards PT goals: Not progressing toward goals - comment (lethargy)    Frequency    Min 3X/week      PT Plan Discharge plan needs to be updated    Co-evaluation PT/OT/SLP Co-Evaluation/Treatment: Yes Reason for Co-Treatment: Complexity of the patient's impairments (multi-system involvement);Necessary to address cognition/behavior during functional activity;For patient/therapist safety PT goals addressed during session: Mobility/safety with mobility;Balance OT goals addressed during session: ADL's and self-care      AM-PAC PT "6 Clicks" Mobility   Outcome Measure  Help needed turning from your back to your side while in a flat bed without using bedrails?: Total Help needed moving from lying on your back to sitting on the side of a flat bed without using bedrails?: Total Help needed moving to and from a bed to a chair (including a wheelchair)?: Total Help needed standing up from a chair using your arms (e.g., wheelchair or bedside chair)?: Total Help needed to walk in hospital room?: Total Help needed climbing 3-5 steps with a railing? : Total 6 Click Score: 6    End of Session   Activity Tolerance: Patient limited by lethargy Patient left: in bed;with call bell/phone within reach;with bed alarm set Nurse Communication: Mobility status (seizure precaution bed pads) PT Visit Diagnosis: Other abnormalities of gait and mobility (R26.89);Difficulty in walking, not elsewhere classified (R26.2)     Time: 7939-6886 PT Time Calculation (min) (ACUTE ONLY): 30 min  Charges:  $Therapeutic Activity: 8-22 mins                     Leighton Roach, PT  Acute Rehab Services  Pager [phone] Office [place] 07/28/2021, 12:47 PM

## 2021-07-28 NOTE — Progress Notes (Signed)
LTM EEG discontinued - no skin breakdown at unhook. [facility] notified to discontinue monitoring.  

## 2021-07-28 NOTE — Progress Notes (Signed)
Occupational Therapy Treatment Patient Details Name: Kirsten Mcmahon MRN: [id number] DOB: 1946-09-29 Today's Date: 07/28/2021    History of present illness 75 y.o. female presenting from dialysis to ED 8/10 with breakthrough seizures after missed dose of keppra. Patient from home with PACE. Admission complicated by AMS, confusion and agitation. PMHx significant for HTN, HLD, DMII, stroke with residual left-sided weakness and extensor tone, wheelchair-bound, GERD, hypothyroidism, depression, breast cancer, CKD stage IIIb on HD.   OT comments  Pt received in bed with continuous EEG. Pt awake but increased lethargy during session, per RN, pt received Haldol at shift change due to agitation. Pt required totalA+2 for bed mobility. She tolerated sitting EOB about 66minutes with minguard-minA for stability. Pt required maxA for washing her face. Pt will continue to benefit from skilled OT services to maximize safety and independence with ADL/IADL and functional mobility. Will continue to follow acutely and progress as tolerated.    Follow Up Recommendations  SNF;Home health OT;Supervision/Assistance - 24 hour;Other (comment) (Given functional decline patient would benefit from SNF placement. If SNF is not an option (patient is a PACE participant), strongly recommend 24/7 sup/assist from appropriate staff, hoyer lift for transfers, and custom [facility] consult.)    Equipment Recommendations  Other (comment) (custom wheelchair consult (if patient has not had one yet).)    Recommendations for Other Services      Precautions / Restrictions Precautions Precautions: Fall Precaution Comments: HOH; wheelchair bound at baseline Restrictions Weight Bearing Restrictions: No       Mobility Bed Mobility Overal bed mobility: Needs Assistance Bed Mobility: Supine to Sit;Sit to Supine     Supine to sit: Total assist;+2 for physical assistance;+2 for safety/equipment Sit to supine: Total assist;+2 for physical  assistance;+2 for safety/equipment   General bed mobility comments: totalA for all aspects of bed mobiltiy    Transfers                 General transfer comment: deferred    Balance Overall balance assessment: Needs assistance Sitting-balance support: No upper extremity supported;Feet supported Sitting balance-[name] Scale: Poor Sitting balance - Comments: minguard-minA for stability sitting EOB. pt with slight right lateral lean, required assistance to correct. improved postural control with engagement of RUE Postural control: Right lateral lean                                 ADL either performed or assessed with clinical judgement   ADL Overall ADL's : Needs assistance/impaired     Grooming: Maximal assistance;Bed level Grooming Details (indicate cue type and reason): Max A and hand over hand at bedlevel to was face             Lower Body Dressing: Total assistance                 General ADL Comments: pt sat EOB with minguard-minA for 10 minutes. While sitting EOB, pt required support at elbow to bring washcloth to her face     Vision   Vision Assessment?: Vision impaired- to be further tested in functional context Additional Comments: R gaze preference, able to look L with cueing. unable to complete formal assessment secondary to cognition and level of arousal   Perception     Praxis      Cognition Arousal/Alertness: Lethargic Behavior During Therapy: Flat affect Overall Cognitive Status: No family/caregiver present to determine baseline cognitive functioning  General Comments: Family follows 1-step verbal commands with <10% accuracy. Pt minimally verbal this dated, stated her name. Pt appeared lethargic, requiring max cues to keep eyes open at end of sensation.        Exercises Exercises: General Upper Extremity General Exercises - Upper Extremity Shoulder Flexion: AAROM;PROM;Both;5  reps Shoulder Extension: AAROM;PROM;Both;5 reps Elbow Flexion: AAROM;PROM;Both;5 reps Elbow Extension: AAROM;PROM;Both;5 reps Wrist Flexion: AAROM;PROM;Both;5 reps Wrist Extension: AAROM;PROM;Both;5 reps   Shoulder Instructions       General Comments vss;pt with continuous EEG active during session    Pertinent Vitals/ Pain       Pain Assessment: Faces Faces Pain Scale: Hurts a little bit Pain Location: LUE with ROM Pain Descriptors / Indicators: Grimacing Pain Intervention(s): Limited activity within patient's tolerance;Monitored during session  Home Living                                          Prior Functioning/Environment              Frequency  Min 2X/week        Progress Toward Goals  OT Goals(current goals can now be found in the care plan section)  Progress towards OT goals: Progressing toward goals  Acute Rehab OT Goals Patient Stated Goal: Unable to state OT Goal Formulation: Patient unable to participate in goal setting Time For Goal Achievement: 08/06/21 Potential to Achieve Goals: Fair ADL Goals Pt Will Perform Eating: with set-up;sitting Pt Will Perform Grooming: sitting;with mod assist Pt Will Perform Upper Body Dressing: with mod assist;sitting Pt Will Perform Lower Body Dressing: with mod assist;sitting/lateral leans;bed level Pt/caregiver will Perform Home Exercise Program: Increased ROM;Increased strength;Right Upper extremity;With written HEP provided;With minimal assist Additional ADL Goal #1: Patient will maintain unsupported sitting balance at EOB with Min A in prep for seated ADLs. Additional ADL Goal #2: Patient will follow 1-step verbal commands with 75% accuracy in prep for ADLs.  Plan Discharge plan remains appropriate    Co-evaluation    PT/OT/SLP Co-Evaluation/Treatment: Yes Reason for Co-Treatment: Complexity of the patient's impairments (multi-system involvement);For patient/therapist safety;To address  functional/ADL transfers   OT goals addressed during session: ADL's and self-care      AM-PAC OT "6 Clicks" Daily Activity     Outcome Measure   Help from another person eating meals?: A Lot Help from another person taking care of personal grooming?: Total Help from another person toileting, which includes using toliet, bedpan, or urinal?: Total Help from another person bathing (including washing, rinsing, drying)?: Total Help from another person to put on and taking off regular upper body clothing?: Total Help from another person to put on and taking off regular lower body clothing?: Total 6 Click Score: 7    End of Session    OT Visit Diagnosis: Muscle weakness (generalized) (M62.81);Hemiplegia and hemiparesis Hemiplegia - Right/Left: Left Hemiplegia - dominant/non-dominant: Non-Dominant Hemiplegia - caused by: Cerebral infarction   Activity Tolerance Patient tolerated treatment well;Other (comment) (Decreased participation given functional decline.)   Patient Left in bed;with call bell/phone within reach;with bed alarm set   Nurse Communication Mobility status;Need for lift equipment        Time: 5732-2025 OT Time Calculation (min): 30 min  Charges: OT General Charges $OT Visit: 1 Visit OT Treatments $Self Care/Home Management : 8-22 mins  Helene Kelp OTR/L Acute Rehabilitation Services Office: [place] 07/28/2021, 11:23 AM

## 2021-07-28 NOTE — Progress Notes (Signed)
EEG maintenance performed.  No skin breakdown observed at electrode sites  C4, Pz, Fp1, Fp2, F8, F7, P3,P4,P8.

## 2021-07-29 LAB — RENAL FUNCTION PANEL
Albumin: 2.2 g/dL — ABNORMAL LOW (ref 3.5–5.0)
Anion gap: 7 (ref 5–15)
BUN: 31 mg/dL — ABNORMAL HIGH (ref 8–23)
CO2: 25 mmol/L (ref 22–32)
Calcium: 8.3 mg/dL — ABNORMAL LOW (ref 8.9–10.3)
Chloride: 93 mmol/L — ABNORMAL LOW (ref 98–111)
Creatinine, Ser: 5.66 mg/dL — ABNORMAL HIGH (ref 0.44–1.00)
GFR, Estimated: 7 mL/min — ABNORMAL LOW (ref >60)
Glucose, Bld: 172 mg/dL — ABNORMAL HIGH (ref 70–99)
Phosphorus: 3.4 mg/dL (ref 2.5–4.6)
Potassium: 3.3 mmol/L — ABNORMAL LOW (ref 3.5–5.1)
Sodium: 125 mmol/L — ABNORMAL LOW (ref 135–145)

## 2021-07-29 LAB — GLUCOSE, CAPILLARY
Glucose-Capillary: 104 mg/dL — ABNORMAL HIGH (ref 70–99)
Glucose-Capillary: 118 mg/dL — ABNORMAL HIGH (ref 70–99)
Glucose-Capillary: 137 mg/dL — ABNORMAL HIGH (ref 70–99)
Glucose-Capillary: 108 mg/dL — ABNORMAL HIGH (ref 70–99)
Glucose-Capillary: 124 mg/dL — ABNORMAL HIGH (ref 70–99)
Glucose-Capillary: 105 mg/dL — ABNORMAL HIGH (ref 70–99)
Glucose-Capillary: 115 mg/dL — ABNORMAL HIGH (ref 70–99)

## 2021-07-29 LAB — CBC
HCT: 29.3 % — ABNORMAL LOW (ref 36.0–46.0)
Hemoglobin: 9.5 g/dL — ABNORMAL LOW (ref 12.0–15.0)
MCH: 28.4 pg (ref 26.0–34.0)
MCHC: 32.4 g/dL (ref 30.0–36.0)
MCV: 87.7 fL (ref 80.0–100.0)
Platelets: 212 10*3/uL (ref 150–400)
RBC: 3.34 MIL/uL — ABNORMAL LOW (ref 3.87–5.11)
RDW: 14.8 % (ref 11.5–15.5)
WBC: 5.5 10*3/uL (ref 4.0–10.5)
nRBC: 0 % (ref 0.0–0.2)

## 2021-07-29 MED ORDER — LACOSAMIDE 50 MG PO TABS
100.0000 mg | ORAL_TABLET | Freq: Two times a day (BID) | ORAL | Status: DC
  Administered 2021-07-29 – 2021-07-31 (×5): 100 mg via ORAL
  Filled 2021-07-29 (×5): qty 2

## 2021-07-29 MED ORDER — ALTEPLASE 2 MG IJ SOLR
INTRAMUSCULAR | Status: AC
  Administered 2021-07-29: 4 mg
  Filled 2021-07-29: qty 4

## 2021-07-29 NOTE — Progress Notes (Signed)
Physical Therapy Treatment Patient Details Name: Kirsten Mcmahon MRN: [id number] DOB: 28-Sep-1946 Today's Date: 07/29/2021    History of Present Illness 75 y.o. female presenting from dialysis to ED 8/10 with breakthrough seizures after missed dose of keppra. Patient from home with PACE. Admission complicated by AMS, confusion and agitation. PMHx significant for HTN, HLD, DMII, stroke with residual left-sided weakness and extensor tone, wheelchair-bound, GERD, hypothyroidism, depression, breast cancer, CKD stage IIIb on HD.    PT Comments    Pt only responsive to name, minimal active movement t/o session. Pt did tolerate 5 min of sitting EOB and completed std pvt to chair in prep for HD. Focused on cervical ROM due to patient resting in R sidebending and forward flexion. Acute PT to cont to follow.    Follow Up Recommendations  SNF;Supervision/Assistance - 24 hour     Equipment Recommendations       Recommendations for Other Services       Precautions / Restrictions Precautions Precautions: Fall Precaution Comments: HOH; wheelchair bound at baseline Restrictions Weight Bearing Restrictions: No    Mobility  Bed Mobility Overal bed mobility: Needs Assistance Bed Mobility: Supine to Sit     Supine to sit: Total assist;+2 for physical assistance;HOB elevated     General bed mobility comments: totalA for LE and trunk elevation, pt with no active participation, pt with strong R lateral lean requiring maxA to maintain EOB balance    Transfers Overall transfer level: Needs assistance   Transfers: Stand Pivot Transfers   Stand pivot transfers: Total assist;+2 physical assistance       General transfer comment: pt again with no active participation however due to flexion contractions pts LE stayed stiff and made it easier to pivot  Ambulation/Gait             General Gait Details: w/c at baseline   Stairs             Wheelchair Mobility    Modified  Rankin (Stroke Patients Only)       Balance Overall balance assessment: Needs assistance Sitting-balance support: No upper extremity supported;Feet supported Sitting balance-[name] Scale: Poor Sitting balance - Comments: maxA, strong R lateral lean, holds L LE in extension pushing self the R, has to put L UE in lap due to this Postural control: Right lateral lean;Posterior lean                                  Cognition Arousal/Alertness: Lethargic Behavior During Therapy: Flat affect Overall Cognitive Status: No family/caregiver present to determine baseline cognitive functioning                                 General Comments: pt only responding to name via opening eyes and saying yes otherwise non-verbal, pt with very delayed response, inconsistent command follow      Exercises Other Exercises Other Exercises: passive cervical ROM into L side bending to neutral from signicant R lateral side bending, worked on cervical rotation    General Comments General comments (skin integrity, edema, etc.): VSS      Pertinent Vitals/Pain Pain Assessment: Faces Faces Pain Scale: Hurts a little bit Pain Location: neck with ROM Pain Descriptors / Indicators: Grimacing Pain Intervention(s): Limited activity within patient's tolerance    Home Living  Prior Function            PT Goals (current goals can now be found in the care plan section) Progress towards PT goals: Progressing toward goals    Frequency    Min 3X/week      PT Plan Current plan remains appropriate    Co-evaluation              AM-PAC PT "6 Clicks" Mobility   Outcome Measure  Help needed turning from your back to your side while in a flat bed without using bedrails?: Total Help needed moving from lying on your back to sitting on the side of a flat bed without using bedrails?: Total Help needed moving to and from a bed to a chair (including a  wheelchair)?: Total Help needed standing up from a chair using your arms (e.g., wheelchair or bedside chair)?: Total Help needed to walk in hospital room?: Total Help needed climbing 3-5 steps with a railing? : Total 6 Click Score: 6    End of Session Equipment Utilized During Treatment: Gait belt Activity Tolerance: Patient limited by lethargy Patient left: in chair;with call bell/phone within reach;with chair alarm set Nurse Communication: Mobility status;Need for lift equipment (use lift to get back to bed after HD) PT Visit Diagnosis: Other abnormalities of gait and mobility (R26.89);Difficulty in walking, not elsewhere classified (R26.2)     Time: 1607-3710 PT Time Calculation (min) (ACUTE ONLY): 18 min  Charges:  $Therapeutic Activity: 8-22 mins                     Kittie Plater, PT, DPT Acute Rehabilitation Services Pager #: [phone] Office #: [phone]    Berline Lopes 07/29/2021, 9:42 AM

## 2021-07-29 NOTE — Progress Notes (Signed)
Neurology Progress Note  S: Patient can not participate in robust ROS due to altered mental status. She does deny pain.   O: Current vital signs: BP 106/73 (BP Location: Left Arm)   Pulse 63   Temp 98.4 F (36.9 C) (Oral)   Resp 14   Wt 59.9 kg   SpO2 100%   BMI 24.15 kg/m  Vital signs in last 24 hours: Temp:  [97.8 F (36.6 C)-98.6 F (37 C)] 98.4 F (36.9 C) (08/24 0829) Pulse Rate:  [62-72] 63 (08/24 0829) Resp:  [14-17] 14 (08/24 0829) BP: (106-136)/(53-73) 106/73 (08/24 0829) SpO2:  [97 %-100 %] 100 % (08/24 0339) Weight:  [59.9 kg] 59.9 kg (08/24 0500)  GENERAL: Chronically ill appearing female sitting in recliner. Awake but closes eyes at times. NAD. HEENT: Normocephalic and atraumatic. LUNGS: Normal respiratory effort.  Ext: warm.  NEURO:  Mental Status: Alert, but closes her eyes sometimes. States her name. Doesn't answer other orientation questions. NP asked her what she had for breakfast. She didn't reply. When NP gave her choices, she said yes to every item. Smiled on command today.  Speech/Language: speech is without aphasia or dysarthria.  Naming-she correctly named a pen and told NP it is used for writing. Could not name watch. Repeated phase, today is a sunny day. Bradyphrenic.   Cranial Nerves: PERRL. Eyelids elevate symmetrically. Face is symmetrical at rest. Right facial droop with smiling. Hearing intact to voice. RUE grip 4-/5 and moves RUE spontaneously. No movement of LUE noted and hypertonic LUE. Withdraws RLE to noxious stimuli and says "quit". Moves LLE with plantar stimulation. Able to track examiner.   Medications  Current Facility-Administered Medications:    (feeding supplement) PROSource Plus liquid 30 mL, 30 mL, Oral, TID BM, Tobie Poet E, NP, 30 mL at 07/28/21 2007   acetaminophen (TYLENOL) tablet 650 mg, 650 mg, Oral, Q6H PRN, 650 mg at 07/18/21 1816 **OR** acetaminophen (TYLENOL) suppository 650 mg, 650 mg, Rectal, Q6H PRN, Shalhoub,  Sherryll Burger, MD   aspirin chewable tablet 81 mg, 81 mg, Oral, Daily, Adhikari, Amrit, MD, 81 mg at 07/28/21 1227   calcitRIOL (ROCALTROL) capsule 0.5 mcg, 0.5 mcg, Oral, Q M,W,F-HD, Valentina Gu, NP, 0.5 mcg at 07/22/21 1411   Chlorhexidine Gluconate Cloth 2 % PADS 6 each, 6 each, Topical, Q0600, Valentina Gu, NP, 6 each at 07/29/21 [phone]   feeding supplement (BOOST / RESOURCE BREEZE) liquid 1 Container, 1 Container, Oral, TID BM, Danford, Suann Larry, MD, 1 Container at 07/28/21 2008   heparin injection 5,000 Units, 5,000 Units, Subcutaneous, Q8H, Shalhoub, Sherryll Burger, MD, 5,000 Units at 07/29/21 6440   labetalol (NORMODYNE) injection 10 mg, 10 mg, Intravenous, Q2H PRN, Adhikari, Amrit, MD   lacosamide (VIMPAT) 100 mg in sodium chloride 0.9 % 25 mL IVPB, 100 mg, Intravenous, Q12H, Greta Doom, MD, Stopped at 07/29/21 0618   latanoprost (XALATAN) 0.005 % ophthalmic solution 1 drop, 1 drop, Both Eyes, QHS, Shalhoub, Sherryll Burger, MD, 1 drop at 07/27/21 2153   letrozole [facility]) tablet 2.5 mg, 2.5 mg, Oral, Daily, Shalhoub, Sherryll Burger, MD, 2.5 mg at 07/28/21 1227   levothyroxine (SYNTHROID) tablet 112 mcg, 112 mcg, Oral, Q0600, Vernelle Emerald, MD, 112 mcg at 07/29/21 3474   magic mouthwash, 5 mL, Oral, TID, Mahan, Kasie J, NP, 5 mL at 07/28/21 2207   metoprolol succinate (TOPROL-XL) 24 hr tablet 50 mg, 50 mg, Oral, Daily, Shalhoub, Sherryll Burger, MD, 50 mg at 07/28/21 1234   multivitamin (RENA-VIT)  tablet 1 tablet, 1 tablet, Oral, QHS, Dana Allan I, MD, 1 tablet at 07/28/21 2207   ondansetron (ZOFRAN) tablet 4 mg, 4 mg, Oral, Q6H PRN **OR** ondansetron (ZOFRAN) injection 4 mg, 4 mg, Intravenous, Q6H PRN, Shalhoub, Sherryll Burger, MD, 4 mg at 07/17/21 2025   polyethylene glycol (MIRALAX / GLYCOLAX) packet 17 g, 17 g, Oral, Daily PRN, Shalhoub, Sherryll Burger, MD   [COMPLETED] thiamine 500mg  in normal saline (34ml) IVPB, 500 mg, Intravenous, Q8H, Last Rate: 100 mL/hr at 07/22/21 0621, 500 mg  at 07/22/21 0621 **FOLLOWED BY** [COMPLETED] thiamine (B-1) 250 mg in sodium chloride 0.9 % 50 mL IVPB, 250 mg, Intravenous, Daily, Last Rate: 100 mL/hr at 07/27/21 1415, 250 mg at 07/27/21 1415 **FOLLOWED BY** thiamine (B-1) injection 100 mg, 100 mg, Intravenous, Daily, Kirby-Graham, Karsten Fells, NP, 100 mg at 07/28/21 1226  No new Imaging EEG 8/23-8/24 overnight: This study is suggestive of cortical dysfunction in right centro-parietal region consistent with prior craniotomy and underlying stroke.there is also moderate degree of encephalopathy, nonspecific etiology.  No seizures or definite epileptiform discharges were seen throughout the recording.   Basic Metabolic Panel: Recent Labs  Lab 07/24/21 0735 07/26/21 0915 07/26/21 1635 07/27/21 0716 07/27/21 0945 07/27/21 1753 07/28/21 0346  NA 132*  --   --   --  129*  --  130*  K 4.0  --   --   --  3.9  --  4.7  CL 99  --   --   --  98  --  95*  CO2 25  --   --   --  24  --  26  GLUCOSE 73  --   --   --  113*  --  68*  BUN 17  --   --   --  32*  --  19  CREATININE 5.28*  --   --   --  6.51*  --  4.46*  CALCIUM 8.4*  --   --   --  8.2*  --  7.9*  MG  --  2.0 2.1 2.1  --  2.0  --   PHOS 4.4 4.3 4.0 4.6 4.5 2.5  --     CBC: Recent Labs  Lab 07/24/21 0735 07/27/21 0945 07/28/21 0346  WBC 4.6 5.5 7.2  HGB 10.4* 9.8* 10.6*  HCT 33.6* 31.9* 34.4*  MCV 93.9 92.2 91.7  PLT 146* 177 119*    Assessment: 75 yo female on Hospital Day #13 for altered mental status in face of dementia, delirium, seizure, and old stroke. Imaging and other testing has not revealed reversible cause for her encephalopathy, therefore, this is likely to be delirium in the face of dementia. . Her presentation continues to be likely secondary to toxic metabolic encephalopathy and delirium which waxes and wanes. It may takes weeks to months for delirium to fully clear. However, she is better on exam today which is encouraging, and per palliative care notes, son does  feel that she is near her normal baseline. Overnight EEG was negative after starting Vimpat, suggesting that her seizures are indeed well controlled and that her waxing and waning is not secondary to intermittent seizure activity.  Unfortunately the nature of dementia is progressive decline, as well as waxing and waning mental status, often with stepwise decline overlaid when there is an acute stressor such as hospitalization, and we appreciate that this may eventually lead to inability of her to participate in life-prolonging therapies such as dialysis.  Impression: -multifactorial toxic/metabolic  encephalopathy.  -Delirium.  -Seizure this hospitalization.  -ESRD on dialysis  Recommendations/Plan:  -Continue Vimpat 100mg  q12 hours (IV or po).  -Continue delirium precautions.  -Continue seizure precautions.  -Ativan 2mg  IV only for a seizure lasting over 5 minutes and call neurology.  -Continue to avoid sedatives and those medications on the Beer's list for the elderly.  -Agree with PT recommendation for SNF.  -From a neurological perspective, patient is appropriate for discharge at this time with no further inpatient neurology work-up needed, defer to primary team and nephrology regarding her other medical comorbidities -Neurology will be available for questions prn, please reach out if new neurological concerns arise.   Pt seen by Clance Boll, MSN, APN-BC/Nurse Practitioner/Neuro and later by MD. Note and plan to be edited as needed by MD.  Pager: [phone]  Attending Neurologist's note:  I personally saw this patient, gathering history, performing a neurologic examination, reviewing relevant labs, and formulated the assessment and plan, adding the note above for completeness and clarity to accurately reflect my thoughts.  Discussed with primary team and nephrology via secure chat.  Lesleigh Noe MD-PhD [facility] [phone]  Available 7 AM to 7 PM, outside these  hours please contact Neurologist on call listed on AMION

## 2021-07-29 NOTE — Progress Notes (Signed)
Speech Language Pathology Treatment: Dysphagia  Patient Details Name: Kirsten Mcmahon MRN: [id number] DOB: 1946/09/09 Today's Date: 07/29/2021 Time: 0723-0756 SLP Time Calculation (min) (ACUTE ONLY): 33 min  Assessment / Plan / Recommendation Clinical Impression  Skilled SLP follow up to address dysphagia goals.  Pt required total cues to allow po intake after SLP woke her.  SLP repositioned pt upright in bed and assisted with minimal intake of breakfast including grape juice via tip of straw x8 boluses, magic cup x6 boluses via tsp and french toast x1 bolus.    Prolonged oral transiting/mastication clinically present with no indication of airway compromise via tsp liquids.  Moderate coughing noted after approx 6th bolus of magic cup and after french toast bolus - with pt recovering quickly.    Much improved clinical tolerance of thin liquids - no cough reflex. But pt did not form adequate labial seal to propel bolus into oral cavity - thus tip of straw used for safety.  Pt is able to verbalize "yes" to being sleepy today and "More sleepy" than normal.   Spoke to NT who fed pt yesterday and advised that pt did NOT cough during entire lunch meal = approximately 75% of meal consumed.  She advised pt did not eat breakfast.  Given NT report and pt is afebrile on room air and lung sounds are diminished, recommend continue diet with strict precautions.  Pt did allow SLP to orally suction her after her meal but did not follow directions with total cues, ? if due to her dementia and/or mentation.  Posted swallow precautions in room for pt's RN, NT.    Will follow up next date- requested NT reach out to SLP if pt is coughing with intake at lunch time as short term modification of diet may be indicated to maximize airway protection.  Note poor intake and continued waxing/waning of mentation and thus maximizing intake when fully alert and accepting advised.      HPI HPI: 75 y.o. female presenting from  dialysis to ED 8/10 with breakthrough seizures after missed dose of keppra. Dx multifactorial toxic/metabolic encephalopathy, delirium. Patient from home with PACE. Admission complicated by AMS, confusion and agitation.  She has had poor PO intake since admission. PMHx significant for HTN, HLD, DMII, stroke with residual left-sided weakness and extensor tone,dementia, wheelchair-bound, GERD, hypothyroidism, depression, breast cancer, CKD stage IIIb on HD. Followed by SLP during prior admission with MBS on 05/22/21: mild dysphagia c/b impaired bolus control, transient penetration (PAS2). Dys3/thin liquid was recommended.  Palliative Care is following and ordered SLP swallow eval on 8/19 due to observed coughing with PO intake.  Pt underwent MBS on 07/28/2021 and recommended diet was dys3/thin.  Today pt seen to address dysphagia goals.  NT reports she fed pt yesterday and pt did not each breakfast but consumed 75% of lunch with no coughing.      SLP Plan  Continue with current plan of care       Recommendations  Diet recommendations: Dysphagia 3 (mechanical soft);Thin liquid Medication Administration: Crushed with puree Compensations: Small sips/bites;Slow rate (assure clearance of oral retention on right, oral suctionprn) Postural Changes and/or Swallow Maneuvers: Seated upright 90 degrees;Upright 30-60 min after meal                Oral Care Recommendations: Oral care BID Follow up Recommendations: Other (comment) (tba) SLP Visit Diagnosis: Dysphagia, unspecified (R13.10) Plan: Continue with current plan of care       GO  Kirsten Lime, MS [facility] SLP Acute Rehab Services Office [phone] Pager [phone]  Macario Golds 07/29/2021, 8:10 AM

## 2021-07-29 NOTE — Progress Notes (Signed)
Daily Progress Note   Patient Name: Kirsten Mcmahon       Date: 07/29/2021 DOB: 1946-01-17  Age: 75 y.o. MRN#: [id number] Attending Physician: Lavina Hamman, MD Primary Care Physician: [place] Admit Date: 07/15/2021  Reason for Consultation/Follow-up: Establishing goals of care  Patient Profile/HPI: 75 y.o. female  with past medical history of ESRD on HD, strep bacteremia, stroke, DM2, seizure disorder admitted on 07/15/2021 with seizure during dialysis- noted that patient had been off Keppra for several days prior to seizure. During admission she has been agitated at times requiring IV ativan which leaves her somnolent. Her seizure medications have also required adjustment. Having poor po intake with episodes of hypoglycemia. Palliative medicine consulted for goals of care discussion.   Subjective: Aleshia is sitting up in the chair. She greets me with "Hello". Does not answer any other of my questions.  I called son [name].  He has been to visit her the past few nights and reports she is about at her mental status baseline and that she ate well for him. Goals continue to be full scope and he is hopeful for her to return home with her current PACE services.  He would not want a feeding tube.  Full code.   Review of Systems  Unable to perform ROS: Mental acuity    Physical Exam Vitals and nursing note reviewed.  Neurological:     Mental Status: She is alert.  Psychiatric:     Comments: Cognitive impairments                 Palliative Assessment/Data: PPS: 30%        Palliative Care Assessment & Plan    Assessment/Recommendations/Plan  GOC - full scope with hopes for patient to return home with her PACE services- she is able to receive SNF level care in  the home according to social worker from [facility]ACE No feeding tube Full code PMT will not follow as goals are clearly established. Please contact PMT if situation changes - [name] is not likely to set limits such as change in code status unless care has been maximized or it is determined that nephrology will no longer offer dialysis   Code Status: Full code  Prognosis:  Unable to determine  Discharge Planning: Home with [place]  plan was discussed with patient's son - [name]  Thank you for allowing the Palliative Medicine Team to assist in the care of this patient.  Total time: 39 minutes  Prolonged billing:      Greater than 50%  of this time was spent counseling and coordinating care related to the above assessment and plan.  Mariana Kaufman, AGNP-C Palliative Medicine   Please contact Palliative Medicine Team phone at [phone] for questions and concerns.

## 2021-07-29 NOTE — Progress Notes (Addendum)
[facility] Progress Note  Subjective: seen in room, no c/o   Vitals:   07/29/21 0339 07/29/21 0500 07/29/21 0829 07/29/21 1114  BP: 128/69  106/73 131/71  Pulse: 64  63 67  Resp: 17  14   Temp: 98.6 F (37 C)  98.4 F (36.9 C) 97.9 F (36.6 C)  TempSrc: Oral  Oral Oral  SpO2: 100%  100% (!) 86%  Weight:  59.9 kg      Exam: General: frail-appearing, opens eyes and responds minimally  Lungs: clear bilaterally Heart: RRR. No murmur, rubs or gallops.  Abdomen: soft, nontender, +BS Lower extremities:no edema  Neuro:  Ox 1 Dialysis Access: TDC    OP HD: G-O MWF   3h 29min  56kg  400/1.5  3K/2.5 bath  TDC  Hep none  -Mircera 150 mcg IV Q 2 weeks (last dose 07/15/2021) -Venofer 50 mg IV weekly (last dose 07/15/2021) -Calcitriol 0.5 mcg PO TIW   Assessment/ Plan: Seizure-experienced a seizure during her last outpatient HD on 07/15/21.  Neurology was following.  IV keppra.  Altered Mental Status - remains confused. CT head (-) acute intracranial abnormalities x 2, last 07/20/21. Seen by neurology. She is significantly worse than her prior poor baseline. Has been here 2 weeks and not better. She is not an appropriate candidate for outpatient dialysis in this state.  ESRD - on HD MWF. HD today, try HD in the chair.   Hypertension/volume  - Patient euvolemic on exam, Bps improving-continue  Anemia of CKD - Hgb 9.4-recently received ESA and Fe in outpatient-monitor trends Secondary Hyperparathyroidism -  Ca 8.5-continue PO calcitriol.  Nutrition - Continue carb-modified renal diet. Albumin 2.6-continue protein supplements. Dispo: overall poor prognosis--> appreciate palliative care c/s     Rob Isamu Trammel 07/29/2021, 11:26 AM   Recent Labs  Lab 07/27/21 0945 07/27/21 1753 07/28/21 0346  K 3.9  --  4.7  BUN 32*  --  19  CREATININE 6.51*  --  4.46*  CALCIUM 8.2*  --  7.9*  PHOS 4.5 2.5  --   HGB 9.8*  --  10.6*    Inpatient medications:  (feeding  supplement) PROSource Plus  30 mL Oral TID BM   aspirin  81 mg Oral Daily   calcitRIOL  0.5 mcg Oral Q M,W,F-HD   Chlorhexidine Gluconate Cloth  6 each Topical Q0600   feeding supplement  1 Container Oral TID BM   heparin  5,000 Units Subcutaneous Q8H   latanoprost  1 drop Both Eyes QHS   letrozole  2.5 mg Oral Daily   levothyroxine  112 mcg Oral Q0600   magic mouthwash  5 mL Oral TID   metoprolol succinate  50 mg Oral Daily   multivitamin  1 tablet Oral QHS   thiamine injection  100 mg Intravenous Daily    lacosamide (VIMPAT) IV Stopped (07/29/21 0618)   acetaminophen **OR** acetaminophen, labetalol, ondansetron **OR** ondansetron (ZOFRAN) IV, polyethylene glycol

## 2021-07-29 NOTE — Progress Notes (Signed)
[facility] PROGRESS NOTE    Kirsten Mcmahon  HYQ:[id number] DOB: 08/25/1946 DOA: 07/15/2021 PCP: [facility]      Brief Narrative:  Mrs. Kirsten Mcmahon is a 75 y.o. F with dementia, ESRD, breast cancer on letrozole, stroke, DM, and history of seizures who came after seizure-like activity during hemodialysis.  In the ER, was loaded with Keppra, then evaluated by Neurology.  Somnolent at that time, but appeared to have had a breakthrough seizure in the setting of a missed dose that morning, and to be post-ictal in combination with sleepy from [place].  It was anticipated that she would gradually wake up over the course of a few days and return to baseline (forgetful, but able to feed self, interactive, participates in self cares).  8/10: admitted and loaded with Keppra 8/10 - 8/12: combative, attributed to [place], switched to Depakote 8/13 - 8/15: progressively encephalopathic, somnolent, EEG negative, CTH unremarkable 8/16: empiric thiamine IV started 8/17: due to somnolence considered as a side effect of Depakote, this was changed to Vimpat 8/18-8/22: mentation waxing and waning   Assessment & Plan:  Acute metabolic encephalopathy due to delirium superimposed on dementia At baseline, MMSE 16/30, but able to feed self, participate in self cares.  No agitation at baseline.    See the above timelines for the patient's waxing and waning encephalopathy.  This has a pattern of delirium, although no clear cause (certainly no seizure, intracranial abnormality, stroke or infection), and presumably is from the hospital setting given her dementia.  - Consult Palliative Care - Stop olanzapine, which was trialed here for agitation, but as she seems sensitive to sedating medications  Delirium precautions:   -Lights and TV off, minimize interruptions at night  -Blinds open and lights on during day  -Glasses/hearing aid with patient  -Frequent  reorientation  -PT/OT when able  -Avoid sedation medications/[place] list medications   STRICTLY AVOID HALDOL OR BENZODIAZEPINES, unless the latter for seizures     Seizure disorder On Keppra PTA.  Missed a dose, had a seizure in dialysis.  Now stable on Vimpat, no further seizure activity here.  - Continue Vimpat   Hypoglycemia Due to poor oral intake.  Has required D10 infusion. - Trial off dextrose tomorrow   Breast cancer - Continue letrozole  ESRD -Consult nephrology for maintenance dialysis, appreciate recommendations  Hypothyroidism - Continue levothyroxine  Hypertension -Continue metoprolol statin  Stage II pressure injury, mid buttocks, present on admission   Anemia of CKD    Goals of care. Nephrology is concerned that the patient has waxing and waning mentation, highly dependent for her care and therefore will not be able to survive outpatient hemodialysis. Will monitor for now.    Disposition: Status is: Inpatient  Remains inpatient appropriate because:IV treatments appropriate due to intensity of illness or inability to take PO  Dispo: The patient is from: SNF              Anticipated d/c is to: SNF              Patient currently is not medically stable to d/c.   Difficult to place patient No   Level of care: Med-Surg       MDM: The below labs and imaging reports were reviewed and summarized above.  Medication management as above.    DVT prophylaxis: heparin injection 5,000 Units Start: 07/16/21 1400  Code Status: full           Subjective: Awake.  No  nausea no vomiting.  Denies any acute complaint.    Objective: Vitals:   07/29/21 1330 07/29/21 1341 07/29/21 1453 07/29/21 1927  BP: (!) 161/83 (!) 161/83 (!) 149/79 (!) 151/66  Pulse: 68 62 69 72  Resp: 18 20 14 17   Temp:  98.2 F (36.8 C) 98.4 F (36.9 C) 98.3 F (36.8 C)  TempSrc:   Axillary Oral  SpO2:  100% 100% 99%  Weight:  56 kg      Intake/Output Summary  (Last 24 hours) at 07/29/2021 2037 Last data filed at 07/29/2021 1700 Gross per 24 hour  Intake 180 ml  Output -106 ml  Net 286 ml    Filed Weights   07/29/21 0500 07/29/21 1319 07/29/21 1341  Weight: 59.9 kg 55.5 kg 56 kg    Examination: General: Appear in mild distress, no Rash; Oral Mucosa Clear, moist. no Abnormal Neck Mass Or lumps, Conjunctiva normal  Cardiovascular: S1 and S2 Present, no Murmur, Respiratory: good respiratory effort, Bilateral Air entry present and CTA, no Crackles, no wheezes Abdomen: Bowel Sound present, Soft and no tenderness Extremities: no Pedal edema Neurology: alert and oriented to self, not time, place, and person affect appropriate. no new focal deficit Gait not checked due to patient safety concerns   Data Reviewed: I have personally reviewed following labs and imaging studies:  CBC: Recent Labs  Lab 07/24/21 0735 07/27/21 0945 07/28/21 0346 07/29/21 1335  WBC 4.6 5.5 7.2 5.5  HGB 10.4* 9.8* 10.6* 9.5*  HCT 33.6* 31.9* 34.4* 29.3*  MCV 93.9 92.2 91.7 87.7  PLT 146* 177 119* 878    Basic Metabolic Panel: Recent Labs  Lab 07/24/21 0735 07/26/21 0915 07/26/21 1635 07/27/21 0716 07/27/21 0945 07/27/21 1753 07/28/21 0346 07/29/21 1335  NA 132*  --   --   --  129*  --  130* 125*  K 4.0  --   --   --  3.9  --  4.7 3.3*  CL 99  --   --   --  98  --  95* 93*  CO2 25  --   --   --  24  --  26 25  GLUCOSE 73  --   --   --  113*  --  68* 172*  BUN 17  --   --   --  32*  --  19 31*  CREATININE 5.28*  --   --   --  6.51*  --  4.46* 5.66*  CALCIUM 8.4*  --   --   --  8.2*  --  7.9* 8.3*  MG  --  2.0 2.1 2.1  --  2.0  --   --   PHOS 4.4 4.3 4.0 4.6 4.5 2.5  --  3.4    GFR: Estimated Creatinine Clearance: 6.6 mL/min (A) (by C-G formula based on SCr of 5.66 mg/dL (H)). Liver Function Tests: Recent Labs  Lab 07/24/21 0735 07/27/21 0945 07/29/21 1335  ALBUMIN 2.4* 2.1* 2.2*    No results for input(s): LIPASE, AMYLASE in the last 168  hours. No results for input(s): AMMONIA in the last 168 hours.  Coagulation Profile: No results for input(s): INR, PROTIME in the last 168 hours. Cardiac Enzymes: No results for input(s): CKTOTAL, CKMB, CKMBINDEX, TROPONINI in the last 168 hours. BNP (last 3 results) No results for input(s): PROBNP in the last 8760 hours. HbA1C: No results for input(s): HGBA1C in the last 72 hours. CBG: Recent Labs  Lab 07/29/21 0921 07/29/21 1020 07/29/21  [place] 07/29/21 1608 07/29/21 2005  GLUCAP 137* 108* 124* 105* 115*    Lipid Profile: No results for input(s): CHOL, HDL, LDLCALC, TRIG, CHOLHDL, LDLDIRECT in the last 72 hours. Thyroid Function Tests: No results for input(s): TSH, T4TOTAL, FREET4, T3FREE, THYROIDAB in the last 72 hours. Anemia Panel: No results for input(s): VITAMINB12, FOLATE, FERRITIN, TIBC, IRON, RETICCTPCT in the last 72 hours. Urine analysis:    Component Value Date/Time   COLORURINE YELLOW 07/27/2021 1907   APPEARANCEUR HAZY (A) 07/27/2021 1907   LABSPEC 1.003 (L) 07/27/2021 1907   PHURINE 8.0 07/27/2021 1907   GLUCOSEU NEGATIVE 07/27/2021 1907   HGBUR SMALL (A) 07/27/2021 [place] NEGATIVE 07/27/2021 [place] NEGATIVE 07/27/2021 1907   PROTEINUR 100 (A) 07/27/2021 1907   NITRITE NEGATIVE 07/27/2021 1907   LEUKOCYTESUR LARGE (A) 07/27/2021 1907   Sepsis Labs: @LABRCNTIP (procalcitonin:4,lacticacidven:4)  ) Recent Results (from the past 240 hour(s))  Culture, blood (routine x 2)     Status: None (Preliminary result)   Collection Time: 07/25/21  4:38 PM   Specimen: BLOOD RIGHT HAND  Result Value Ref Range Status   Specimen Description BLOOD RIGHT HAND  Final   Special Requests   Final    BOTTLES DRAWN AEROBIC AND ANAEROBIC Blood Culture results may not be optimal due to an inadequate volume of blood received in culture bottles   Culture   Final    NO GROWTH 4 DAYS Performed at [facility], [place] [address], [place], [place]  [address]    Report Status PENDING  Incomplete  Culture, blood (routine x 2)     Status: None (Preliminary result)   Collection Time: 07/25/21  4:42 PM   Specimen: BLOOD  Result Value Ref Range Status   Specimen Description BLOOD RIGHT ANTECUBITAL  Final   Special Requests   Final    BOTTLES DRAWN AEROBIC AND ANAEROBIC Blood Culture adequate volume   Culture   Final    NO GROWTH 4 DAYS Performed at [facility], [place] [address] [place], [place] [address]    Report Status PENDING  Incomplete         Radiology Studies: Overnight EEG with video  Result Date: 07/28/2021 Lora Havens, MD     07/29/2021  9:18 AM Patient Name: Kirsten Mcmahon MRN: [id number] Epilepsy Attending: Lora Havens Referring Physician/Provider: Dr Roland Rack Duration: 07/27/2021 1846 to 07/28/2021 1755  Patient history: 74 yo female who presented with breakthrough seizure provoked by missing Keppra dose.  EEG to evaluate for seizures.  Level of alertness: Awake, asleep  AEDs during EEG study: LCM  Technical aspects: This EEG study was done with scalp electrodes positioned according to the 10-20 International system of electrode placement. Electrical activity was acquired at a sampling rate of 500Hz  and reviewed with a high frequency filter of 70Hz  and a low frequency filter of 1Hz . EEG data were recorded continuously and digitally stored.  Description: No posterior dominant rhythm was seen.  Sleep was characterized by vertex waves, sleep spindles (12-14 Hz, maximal frontocentral region. EEG showed continuous generalized and lateralized right hemisphere 3 to 6 Hz theta-delta slowing. There are also sharply contoured waves in the right centro-parietal region consistent with underlying breach artifact.  Hyperventilation and photic stimulation were not performed.    ABNORMALITY -Breach artifact, right centro-parietal region -Continuous slow, generalized and lateralized right hemisphere  IMPRESSION: This study  is suggestive of cortical dysfunction in right centro-parietal region consistent with prior craniotomy  and underlying stroke.there is also moderate degree of encephalopathy, nonspecific etiology.  No seizures or definite epileptiform discharges were seen throughout the recording. [name]        Scheduled Meds:  (feeding supplement) PROSource Plus  30 mL Oral TID BM   aspirin  81 mg Oral Daily   calcitRIOL  0.5 mcg Oral Q M,W,F-HD   Chlorhexidine Gluconate Cloth  6 each Topical Q0600   feeding supplement  1 Container Oral TID BM   heparin  5,000 Units Subcutaneous Q8H   lacosamide  100 mg Oral BID   latanoprost  1 drop Both Eyes QHS   letrozole  2.5 mg Oral Daily   levothyroxine  112 mcg Oral Q0600   magic mouthwash  5 mL Oral TID   metoprolol succinate  50 mg Oral Daily   multivitamin  1 tablet Oral QHS   Continuous Infusions:     LOS: 12 days    Time spent: 25 minutes    Berle Mull, MD [facility] 07/29/2021, 8:37 PM     Please page though [address] or Epic secure chat:  For [employer], Adult nurse

## 2021-07-30 LAB — BASIC METABOLIC PANEL
Anion gap: 9 (ref 5–15)
BUN: 33 mg/dL — ABNORMAL HIGH (ref 8–23)
CO2: 24 mmol/L (ref 22–32)
Calcium: 8.8 mg/dL — ABNORMAL LOW (ref 8.9–10.3)
Chloride: 92 mmol/L — ABNORMAL LOW (ref 98–111)
Creatinine, Ser: 5.67 mg/dL — ABNORMAL HIGH (ref 0.44–1.00)
GFR, Estimated: 7 mL/min — ABNORMAL LOW (ref >60)
Glucose, Bld: 88 mg/dL (ref 70–99)
Potassium: 3.7 mmol/L (ref 3.5–5.1)
Sodium: 125 mmol/L — ABNORMAL LOW (ref 135–145)

## 2021-07-30 LAB — GLUCOSE, CAPILLARY
Glucose-Capillary: 108 mg/dL — ABNORMAL HIGH (ref 70–99)
Glucose-Capillary: 112 mg/dL — ABNORMAL HIGH (ref 70–99)
Glucose-Capillary: 114 mg/dL — ABNORMAL HIGH (ref 70–99)
Glucose-Capillary: 117 mg/dL — ABNORMAL HIGH (ref 70–99)
Glucose-Capillary: 143 mg/dL — ABNORMAL HIGH (ref 70–99)
Glucose-Capillary: 183 mg/dL — ABNORMAL HIGH (ref 70–99)
Glucose-Capillary: 74 mg/dL (ref 70–99)

## 2021-07-30 LAB — CBC
HCT: 29.7 % — ABNORMAL LOW (ref 36.0–46.0)
Hemoglobin: 9.8 g/dL — ABNORMAL LOW (ref 12.0–15.0)
MCH: 28.9 pg (ref 26.0–34.0)
MCHC: 33 g/dL (ref 30.0–36.0)
MCV: 87.6 fL (ref 80.0–100.0)
Platelets: 203 10*3/uL (ref 150–400)
RBC: 3.39 MIL/uL — ABNORMAL LOW (ref 3.87–5.11)
RDW: 14.6 % (ref 11.5–15.5)
WBC: 6.9 10*3/uL (ref 4.0–10.5)
nRBC: 0 % (ref 0.0–0.2)

## 2021-07-30 LAB — CULTURE, BLOOD (ROUTINE X 2)
Culture: NO GROWTH
Culture: NO GROWTH
Special Requests: ADEQUATE

## 2021-07-30 NOTE — Progress Notes (Addendum)
[facility] Progress Note  Subjective: seen in room, no c/o   Vitals:   07/30/21 0024 07/30/21 0405 07/30/21 0814 07/30/21 1107  BP: 137/73 133/60 114/62 109/65  Pulse: 80 70 67 69  Resp: 16 17 18 14   Temp: 97.8 F (36.6 C) 97.9 F (36.6 C) 98.3 F (36.8 C) 97.7 F (36.5 C)  TempSrc: Oral Oral Oral Oral  SpO2: 94% 99% 100% 100%  Weight:        Exam: General: frail-appearing, opens eyes and responds minimally  Lungs: clear bilaterally Heart: RRR. No murmur, rubs or gallops.  Abdomen: soft, nontender, +BS Lower extremities:no edema  Neuro:  Ox 1 Dialysis Access: TDC    OP HD: G-O MWF   3h 69min  56kg  400/1.5  3K/2.5 bath  TDC  Hep none  -Mircera 150 mcg IV Q 2 weeks (last dose 07/15/2021) -Venofer 50 mg IV weekly (last dose 07/15/2021) -Calcitriol 0.5 mcg PO TIW   Assessment/ Plan: Seizure-experienced a seizure during her last outpatient HD on 07/15/21.  AED's per neurology.  Altered Mental Status - back to baseline. CT head (-) acute intracranial abnormalities x 2, last 07/20/21. Seen by neurology who says she is back to her baseline, they have signed off.  ESRD - usual HD MWF.  HD today off schedule. Due to staffing issues we are doing stable pts only 2 times this week. HD mon/ Thursday this week. Will resume HD as outpatient on Monday.  Disposition - Given MS back to baseline and pt sitting in chair for dialysis, and family has all the equipment at home (hoyer life, etc.) and wishes to continue full medical Rx, no indication for hospice at this time. Would go ahead and start the discharge process. Will speak w/ PCT and PMD.  Hypertension/volume  - Patient euvolemic on exam, Bps improving-continue  Anemia of CKD - Hgb 9.4-recently received ESA and Fe in outpatient-monitor trends Secondary Hyperparathyroidism -  Ca 8.5-continue PO calcitriol.  Nutrition - Continue carb-modified renal diet. Albumin 2.6-continue protein supplements.      Kirsten Mcmahon  Kirsten Mcmahon 07/30/2021, 1:25 PM   Recent Labs  Lab 07/27/21 1753 07/28/21 0346 07/29/21 1335 07/30/21 0426  K  --    < > 3.3* 3.7  BUN  --    < > 31* 33*  CREATININE  --    < > 5.66* 5.67*  CALCIUM  --    < > 8.3* 8.8*  PHOS 2.5  --  3.4  --   HGB  --    < > 9.5* 9.8*   < > = values in this interval not displayed.    Inpatient medications:  (feeding supplement) PROSource Plus  30 mL Oral TID BM   aspirin  81 mg Oral Daily   calcitRIOL  0.5 mcg Oral Q M,W,F-HD   Chlorhexidine Gluconate Cloth  6 each Topical Q0600   feeding supplement  1 Container Oral TID BM   heparin  5,000 Units Subcutaneous Q8H   lacosamide  100 mg Oral BID   latanoprost  1 drop Both Eyes QHS   letrozole  2.5 mg Oral Daily   levothyroxine  112 mcg Oral Q0600   magic mouthwash  5 mL Oral TID   metoprolol succinate  50 mg Oral Daily   multivitamin  1 tablet Oral QHS     acetaminophen **OR** acetaminophen, labetalol, ondansetron **OR** ondansetron (ZOFRAN) IV, polyethylene glycol

## 2021-07-30 NOTE — Progress Notes (Signed)
[facility] Progress Note  Patient: Kirsten Mcmahon    YTK:[id number]  DOA: 07/15/2021     Date of Service: the patient was seen and examined on 07/30/2021  Brief hospital course: Past medical history of dementia, ESRD, seizure disorder, CVA, type II DM, breast cancer on letrozole.  Presents to the hospital with complaints of seizure-like activity during HD likely from missed medication. Neurology was consulted, nephrology was consulted. Patient remained persistently somnolent.  Palliative care was consulted for further goals of care clarification. Currently plan is work on discharge home with case of tried arranging maximal support.  Subjective: Fatigue and tired.  Later in the day was more interactive.  Completed hemodialysis in the chair for 2 hours.  Was able to eat better today compared to yesterday.  Assessment and Plan: 1.  Acute metabolic encephalopathy Combination of delirium superimposed on dementia as well as seizure. Neurology was consulted. At baseline MMSE still 16 out of 30, but able to feed herself comfort supportive care. Currently per neurology patient appears to be at baseline. Continue delirium precaution. Avoid medication that can cause sleepiness and sedation. Avoid Haldol benzodiazepine. Currently on Vimpat.  Continue.  2.  Seizure disorder. Patient was on Keppra prior to admission. Missed a dose and had a seizure in the hemodialysis. Due to sleepiness now on Vimpat. No further seizures activity. EEG negative. Neurology recommends to continue Vimpat on discharge.  3.  Hypoglycemia From poor p.o. intake. Intake improving. Now off of D10 infusion. Monitor.  4.  ESRD on HD MWF. Currently off schedule HD in the hospital due to staffing. Initial concern was patient will not be able to sit in a chair for dialysis. Now the patient is able to sit in the chair for HD, therefore we will arrange for outpatient discharge and outpatient HD. Appreciate  nephrology assistance. Continue calcitriol.  5.  Anemia of chronic kidney disease Hemoglobin stable. On iron and ESA.  6.  Hypothyroidism Continue Synthroid.  Scheduled Meds:  (feeding supplement) PROSource Plus  30 mL Oral TID BM   aspirin  81 mg Oral Daily   calcitRIOL  0.5 mcg Oral Q M,W,F-HD   Chlorhexidine Gluconate Cloth  6 each Topical Q0600   feeding supplement  1 Container Oral TID BM   heparin  5,000 Units Subcutaneous Q8H   lacosamide  100 mg Oral BID   latanoprost  1 drop Both Eyes QHS   letrozole  2.5 mg Oral Daily   levothyroxine  112 mcg Oral Q0600   magic mouthwash  5 mL Oral TID   metoprolol succinate  50 mg Oral Daily   multivitamin  1 tablet Oral QHS   Continuous Infusions: PRN Meds: acetaminophen **OR** acetaminophen, labetalol, ondansetron **OR** ondansetron (ZOFRAN) IV, polyethylene glycol  Body mass index is 22.58 kg/m.  Nutrition Problem: Inadequate oral intake Etiology: decreased appetite Pressure Injury 07/18/21 Buttocks Mid Stage 2 -  Partial thickness loss of dermis presenting as a shallow open injury with a red, pink wound bed without slough. (Active)  07/18/21 0045  Location: Buttocks  Location Orientation: Mid  Staging: Stage 2 -  Partial thickness loss of dermis presenting as a shallow open injury with a red, pink wound bed without slough.  Wound Description (Comments):   Present on Admission: Yes     DVT Prophylaxis:   heparin injection 5,000 Units Start: 07/16/21 1400    Advance goals of care discussion: Pt is Full code.  Family Communication: no family was present at bedside, at the  time of interview.  The pt provided permission to discuss medical plan with the family. Opportunity was given to ask question and all questions were answered satisfactorily.   Data Reviewed: I have personally reviewed and interpreted daily labs, tele strips, imaging. Sodium 125.  Otherwise electrolytes stable.  CBG mostly more than 100.  Hemoglobin  stable.  Physical Exam:  General: Appear in mild distress, no Rash; Oral Mucosa Clear, moist. no Abnormal Neck Mass Or lumps, Conjunctiva normal  Cardiovascular: S1 and S2 Present, aortic systolic  Murmur, Respiratory: good respiratory effort, Bilateral Air entry present and CTA, no Crackles, no wheezes Abdomen: Bowel Sound present, Soft and no tenderness Extremities: no Pedal edema Neurology: Fatigue and oriented to place and person affect flat in affect. no new focal deficit Gait not checked due to patient safety concerns  Vitals:   07/30/21 1630 07/30/21 1700 07/30/21 1730 07/30/21 1811  BP: 111/75 102/65 105/86 (!) 117/49  Pulse: 75 64 61 78  Resp:    15  Temp:    98.3 F (36.8 C)  TempSrc:    Oral  SpO2:    96%  Weight:        Disposition:  Status is: Inpatient  Remains inpatient appropriate because:IV treatments appropriate due to intensity of illness or inability to take PO  Dispo: The patient is from: Home              Anticipated d/c is to: Home              Patient currently is not medically stable to d/c.   Difficult to place patient No        Time spent: 35 minutes. I reviewed all nursing notes, pharmacy notes, vitals, pertinent old records. I have discussed plan of care as described above with RN.  Author: Berle Mull, MD [facility] 07/30/2021 6:42 PM  To reach On-call, see care teams to locate the attending and reach out via [email or web address]. Between 7PM-7AM, please contact night-coverage If you still have difficulty reaching the attending provider, please page the [facility] (Director on Call) for [facility] on amion for assistance.

## 2021-07-30 NOTE — Progress Notes (Signed)
Occupational Therapy Treatment Patient Details Name: Kirsten Mcmahon MRN: [id number] DOB: 1946/01/29 Today's Date: 07/30/2021    History of present illness 75 y.o. female presenting from dialysis to ED 8/10 with breakthrough seizures after missed dose of keppra. Patient from home with PACE. Admission complicated by AMS, confusion and agitation. PMHx significant for HTN, HLD, DMII, stroke with residual left-sided weakness and extensor tone, wheelchair-bound, GERD, hypothyroidism, depression, breast cancer, CKD stage IIIb on HD.   OT comments  Patient still closing eyes during session, but more interactive this date.  Patient unable to grasp utensils to eat, nor could she hold onto a cup, but did drink from a straw.  She did wash her face after setup and with min a for thoroughness.  Patient repositioned with Total assist and a pillow placed under L arm for edema management.  OT will follow in the acute setting to increase independence with self feeding.  Recommend SNF for post acute rehab, unless family can provide 24 hour heavy care.     Follow Up Recommendations  SNF;Supervision/Assistance - 24 hour    Equipment Recommendations  Wheelchair (measurements OT);Wheelchair cushion (measurements OT)    Recommendations for Other Services      Precautions / Restrictions Precautions Precautions: Fall Precaution Comments: HOH; wheelchair bound at baseline Restrictions Weight Bearing Restrictions: No Other Position/Activity Restrictions: extensor tone to LUE and LLE - no functional use of LUE.       Mobility Bed Mobility   Bed Mobility: Rolling Rolling: Max assist         General bed mobility comments: repositioned to L side Patient Response: Cooperative  Transfers                                                                 ADL either performed or assessed with clinical judgement   ADL Overall ADL's : Needs assistance/impaired Eating/Feeding:  Maximal assistance;Bed level   Grooming: Wash/dry face;Minimal assistance;Bed level                                                         Cognition Arousal/Alertness: Lethargic Behavior During Therapy: Flat affect Overall Cognitive Status: No family/caregiver present to determine baseline cognitive functioning                                                            Pertinent Vitals/ Pain       Pain Assessment: Faces Faces Pain Scale: No hurt Pain Intervention(s): Monitored during session                                                          Frequency  Min 2X/week        Progress Toward Goals  OT Goals(current goals can now be found in the care plan section)  Progress towards OT goals: Progressing toward goals  Acute Rehab OT Goals Patient Stated Goal: Unable to state OT Goal Formulation: Patient unable to participate in goal setting Time For Goal Achievement: 08/06/21 Potential to Achieve Goals: Kirsten Mcmahon Discharge plan remains appropriate    Co-evaluation                 AM-PAC OT "6 Clicks" Daily Activity     Outcome Measure   Help from another person eating meals?: A Lot Help from another person taking care of personal grooming?: A Lot Help from another person toileting, which includes using toliet, bedpan, or urinal?: Total Help from another person bathing (including washing, rinsing, drying)?: Total Help from another person to put on and taking off regular upper body clothing?: Total Help from another person to put on and taking off regular lower body clothing?: Total 6 Click Score: 8    End of Session    OT Visit Diagnosis: Muscle weakness (generalized) (M62.81);Hemiplegia and hemiparesis   Activity Tolerance Patient tolerated treatment well   Patient Left in bed;with call bell/phone within reach;with bed alarm set   Nurse Communication          Time:  1445-1500 OT Time Calculation (min): 15 min  Charges: OT General Charges $OT Visit: 1 Visit OT Treatments $Self Care/Home Management : 8-22 mins  07/30/2021  Kirsten Mcmahon, OTR/L  Acute Rehabilitation Services  Office:  [phone]    Kirsten Mcmahon 07/30/2021, 3:09 PM

## 2021-07-31 LAB — GLUCOSE, CAPILLARY
Glucose-Capillary: 100 mg/dL — ABNORMAL HIGH (ref 70–99)
Glucose-Capillary: 72 mg/dL (ref 70–99)
Glucose-Capillary: 182 mg/dL — ABNORMAL HIGH (ref 70–99)

## 2021-07-31 MED ORDER — BOOST BREEZE PO LIQD: 1.0000 | Freq: Three times a day (TID) | ORAL | 0 refills | Status: DC

## 2021-07-31 MED ORDER — DEXTROSE INFANT ORAL GEL 40%: 0.5000 mL/kg | ORAL | 2 refills | Status: DC | PRN

## 2021-07-31 MED ORDER — LACOSAMIDE 100 MG PO TABS
100.0000 mg | ORAL_TABLET | Freq: Two times a day (BID) | ORAL | 0 refills | Status: DC
Start: 2021-07-31 — End: 2023-06-06

## 2021-07-31 MED ORDER — PROSOURCE PLUS PO LIQD: 30.0000 mL | Freq: Three times a day (TID) | ORAL | 0 refills | Status: DC

## 2021-07-31 MED ORDER — ASPIRIN 81 MG PO CHEW: 81.0000 mg | CHEWABLE_TABLET | Freq: Every day | ORAL | 0 refills | Status: DC

## 2021-07-31 MED ORDER — CALCITRIOL 0.5 MCG PO CAPS: 0.5000 ug | ORAL_CAPSULE | ORAL | 0 refills | Status: DC

## 2021-07-31 MED ORDER — MAGIC MOUTHWASH: 5.0000 mL | Freq: Three times a day (TID) | ORAL | 0 refills | Status: AC

## 2021-07-31 MED ORDER — RENA-VITE PO TABS: 1.0000 | ORAL_TABLET | Freq: Every day | ORAL | 0 refills | Status: DC

## 2021-07-31 NOTE — Progress Notes (Signed)
  Speech Language Pathology Treatment: Dysphagia  Patient Details Name: Kirsten Mcmahon MRN: [id number] DOB: August 16, 1946 Today's Date: 07/31/2021 Time: 7471-8550 SLP Time Calculation (min) (ACUTE ONLY): 23 min  Assessment / Plan / Recommendation Clinical Impression  Pt seen for dysphagia tx with intake of thin via cup/straw and puree/soft solids/mixed consistency (fruit cup) with slow, but thorough mastication, timely swallow and no overt s/s of aspiration noted throughout session with min verbal cues provided for lingual sweep to R and alternating solids/liquids utilized throughout session.  Pt in agreement with swallowing strategies/assistance with bolus transition into pharynx.  Continue current diet of Dysphagia 3/thin liquids with swallowing strategies in place.  ST will s/o at this time.  HPI HPI: 75 y.o. female presenting from dialysis to ED 8/10 with breakthrough seizures after missed dose of keppra. Dx multifactorial toxic/metabolic encephalopathy, delirium. Patient from home with PACE. Admission complicated by AMS, confusion and agitation.  She has had poor PO intake since admission. PMHx significant for HTN, HLD, DMII, stroke with residual left-sided weakness and extensor tone,dementia, wheelchair-bound, GERD, hypothyroidism, depression, breast cancer, CKD stage IIIb on HD. Followed by SLP during prior admission with MBS on 05/22/21: mild dysphagia c/b impaired bolus control, transient penetration (PAS2). Dys3/thin liquid was recommended.  Palliative Care is following and ordered SLP swallow eval on 8/19 due to observed coughing with PO intake.  Pt underwent MBS on 07/28/2021 and recommended diet was dys3/thin.  Today pt seen to address dysphagia goals.  NT reports she fed pt yesterday and pt did not each breakfast but consumed 75% of lunch with no coughing.      SLP Plan  Discharge SLP treatment due to (comment) (goals met)       Recommendations  Diet recommendations: Dysphagia 3  (mechanical soft);Thin liquid Liquids provided via: Cup;Straw (with supervision for small sips) Medication Administration: Crushed with puree Compensations: Small sips/bites;Slow rate;Minimize environmental distractions;Other (Comment) (gentle encouragement; ask preferences) Postural Changes and/or Swallow Maneuvers: Seated upright 90 degrees                Oral Care Recommendations: Oral care BID Follow up Recommendations: Other (comment) (TBD; previously home with son) SLP Visit Diagnosis: Dysphagia, unspecified (R13.10) Plan: Discharge SLP treatment due to (comment) (goals met)                       Elvina Sidle, M.S., CCC-SLP 07/31/2021, 12:46 PM

## 2021-07-31 NOTE — Discharge Summary (Signed)
[facility] Discharge Summary   Patient: Kirsten Mcmahon ACZ:[id number]  PCP: [place]  Date of admission: 07/15/2021   Date of discharge:  07/31/2021     Discharge Diagnoses:  Principal Problem:   Breakthrough seizure [facility]) Active Problems:   Essential hypertension   Hypothyroidism   Type 2 diabetes mellitus with ESRD (end-stage renal disease) ([place])   End-stage renal disease on hemodialysis ([place])   Mixed diabetic hyperlipidemia associated with type 2 diabetes mellitus ([place])   Seizure ([place])   Admitted From: Home Disposition:  Home with home health,[facility]  Recommendations for Outpatient Follow-up:  PCP: Follow-up with PCP in 1 week. Follow-up with neurology as recommended. Ideally patient will benefit from palliative care follow-up outpatient.   Follow-up [place] Schedule an appointment as soon as possible for a visit in 1 week(s).   Contact information: [address] [place] [address] [phone]                Discharge Instructions     Ambulatory referral to Neurology   Complete by: As directed    An appointment is requested in approximately: 6 weeks.   Diet - low sodium heart healthy   Complete by: As directed    Discharge wound care:   Complete by: As directed    Daily foam dressing and frequent turning   Increase activity slowly   Complete by: As directed        Diet recommendation: Regular diet: Dysphagia 3 thin liquid diet  Activity: The patient is advised to gradually reintroduce usual activities, as tolerated  Discharge Condition: stable  Code Status: Full code   History of present illness: As per the H and P dictated on admission, "75 year old female with past medical history of hypothyroidism, remote history of breast cancer (2017), end-stage renal disease (MWF HD), previous strokes (right MCA stroke in 1998, and recent multifocal punctate strokes in  05/2021), seizure disorder (Dx 01/2021), diabetes mellitus type 2 (hgb A1C 5.6 05/2021) strep bacteremia (treated with IV vancomycin with hemodialysis, 04/2021) who presents to [facility] emergency department via EMS after patient exhibited seizure-like activity during hemodialysis.   Patient is extremely lethargic and is unable to provide history.  33 the history has been obtained from patient's son via phone conversation.   Patient's son explains that patient ran out of her [place] on 8/9.  Unfortunately, a refill was not obtained prior to running out of medication.  On 8/10, patient did not receive her Keppra and proceeded to go to hemodialysis.  With approximately 30 minutes left in the patient's hemodialysis session patient began to exhibit seizure-like activity.  Hemodialysis session was abruptly stopped and EMS was contacted who promptly came to evaluate the patient.  Seizure-like activity at this point had abruptly stopped however EMS did bring the patient into [facility] emergency department for further evaluation.   Upon evaluation in the emergency department CT imaging the head was performed revealing no evidence of acute disease.  Initial laboratory work-up did not reveal any significant abnormalities considering patient's known history of end-stage renal disease.  ER provider loaded the patient with intravenous Keppra.  Level was not obtained.  ER provider then consulted Dr. Curly Shores with neurology has agreed to see the patient in consultation.  The hospitalist group was then called to assess the patient for admission the hospital."  Hospital Course:  Summary of her active problems in the hospital is  as following. 1.  Acute metabolic encephalopathy Combination of delirium superimposed on dementia as well as seizure. Neurology was consulted. At baseline MMSE still 16 out of 30, but able to feed herself comfort supportive care. Currently per neurology patient appears to be  at baseline. Continue delirium precaution.  Still intermittent delirium. Avoid medication that can cause sleepiness and sedation. Avoid Haldol benzodiazepine. Currently on Vimpat. Continue.   2.  Seizure disorder. Patient was on Keppra prior to admission. Missed a dose and had a seizure in the hemodialysis. Due to sleepiness now on Vimpat. No further seizures activity. EEG negative. Neurology recommends to continue Vimpat on discharge.   3.  Hypoglycemia From poor p.o. intake. Intake improving.  But not the greatest. Family will try to provide home food and expectation is that the patient will do better at home rather than in the hospital. Now off of D10 infusion.   4.  ESRD on HD MWF. Currently off schedule HD in the hospital due to staffing. Initial concern was patient will not be able to sit in a chair for dialysis. Now the patient is able to sit in the chair for HD, therefore we will arrange for outpatient discharge and outpatient HD. Appreciate nephrology assistance. Continue calcitriol.   5.  Anemia of chronic kidney disease Hemoglobin stable. On iron and ESA.   6.  Hypothyroidism Continue Synthroid.  Body mass index is 22.58 kg/m.  Nutrition Problem: Inadequate oral intake Etiology: decreased appetite Nutrition Interventions: Interventions: MVI, Prostat, Tube feeding, Magic cup  Pressure Injury 07/18/21 Buttocks Mid Stage 2 -  Partial thickness loss of dermis presenting as a shallow open injury with a red, pink wound bed without slough. (Active)  07/18/21 0045  Location: Buttocks  Location Orientation: Mid  Staging: Stage 2 -  Partial thickness loss of dermis presenting as a shallow open injury with a red, pink wound bed without slough.  Wound Description (Comments):   Present on Admission: Yes     Patient was seen by physical therapy, who recommended SNF, [facility] will provide SN[facility] level care at home.. On the day of the discharge the patient's vitals  were stable, and no other new acute medical condition were reported. The patient was felt safe to be discharge at Home with Home health.  Consultants: Neurology, nephrology Procedures: EEG, hemodialysis  DISCHARGE MEDICATION: Allergies as of 07/31/2021       Reactions   Contrast Media [iodinated Diagnostic Agents]    Other reaction(s): NO ALLERGY   Latex    Other reaction(s): NO ALLERGY   Shellfish-derived Products    Other reaction(s): NO ALLERGY   Levemir [insulin Detemir] Itching        Medication List     STOP taking these medications    aspirin EC 81 MG tablet Replaced by: aspirin 81 MG chewable tablet   levETIRAcetam 500 MG tablet Commonly known as: KEPPRA       TAKE these medications    (feeding supplement) PROSource Plus liquid Take 30 mLs by mouth 3 (three) times daily between meals.   feeding supplement (BOOST BREEZE) Liqd Take 1 each by mouth 3 (three) times daily.   aspirin 81 MG chewable tablet Chew 1 tablet (81 mg total) by mouth daily. Start taking on: August 01, 2021 Replaces: aspirin EC 81 MG tablet   calcitRIOL 0.5 MCG capsule Commonly known as: ROCALTROL Take 1 capsule (0.5 mcg total) by mouth every Monday, Wednesday, and Friday with hemodialysis.   dextrose 40 % Gel  Commonly known as: GLUTOSE Place 28 mLs inside cheek as needed (for suspected low sugars).   Lacosamide 100 MG Tabs Take 1 tablet (100 mg total) by mouth 2 (two) times daily.   latanoprost 0.005 % ophthalmic solution Commonly known as: XALATAN Place 1 drop into both eyes at bedtime.   letrozole 2.5 MG tablet Commonly known as: FEMARA Take 1 tablet (2.5 mg total) by mouth daily.   levothyroxine 112 MCG tablet Commonly known as: SYNTHROID Take 1 tablet (112 mcg total) by mouth daily before breakfast.   magic mouthwash Soln Take 5 mLs by mouth 3 (three) times daily for 7 days.   metoprolol succinate 25 MG 24 hr tablet Commonly known as: TOPROL-XL Take 50 mg by  mouth daily.   multivitamin Tabs tablet Take 1 tablet by mouth at bedtime.               Discharge Care Instructions  (From admission, onward)           Start     Ordered   07/31/21 0000  Discharge wound care:       Comments: Daily foam dressing and frequent turning   07/31/21 1129            Discharge Exam: Filed Weights   07/29/21 0500 07/29/21 1319 07/29/21 1341  Weight: 59.9 kg 55.5 kg 56 kg   Vitals:   07/30/21 2332 07/31/21 0334  BP: 113/64 116/70  Pulse: 80 84  Resp: 16 16  Temp: 98.1 F (36.7 C) 97.6 F (36.4 C)  SpO2: 100% 98%   General: Appear in no distress, no Rash; Oral Mucosa Clear, moist. no Abnormal Neck Mass Or lumps, Conjunctiva normal  Cardiovascular: S1 and S2 Present, aortic systolic Murmur Respiratory: good respiratory effort, Bilateral Air entry present and CTA, no Crackles, no wheezes Abdomen: Bowel Sound present, Soft and no tenderness Extremities: no Pedal edema Neurology: alert and oriented to place and person affect appropriate. no new focal deficit,  The results of significant diagnostics from this hospitalization (including imaging, microbiology, ancillary and laboratory) are listed below for reference.    Significant Diagnostic Studies: CT HEAD WO CONTRAST (5MM)  Result Date: 07/20/2021 CLINICAL DATA:  Seizure EXAM: CT HEAD WITHOUT CONTRAST TECHNIQUE: Contiguous axial images were obtained from the base of the skull through the vertex without intravenous contrast. COMPARISON:  07/16/2021 FINDINGS: Brain: Old right MCA territory infarct on background of severe chronic small vessel disease. There are multiple old cerebellar infarcts. Diffuse generalized atrophy. No acute hemorrhage. Vascular: No abnormal hyperdensity of the major intracranial arteries or dural venous sinuses. No intracranial atherosclerosis. Skull: The visualized skull base, calvarium and extracranial soft tissues are normal. Sinuses/Orbits: No fluid levels or  advanced mucosal thickening of the visualized paranasal sinuses. No mastoid or middle ear effusion. The orbits are normal. IMPRESSION: 1. No acute intracranial abnormality. 2. Old right MCA territory infarct and multiple old cerebellar infarcts. 3. Severe chronic small vessel disease and generalized atrophy. Electronically Signed   By: Ulyses Jarred M.D.   On: 07/20/2021 19:25   CT HEAD WO CONTRAST (5MM)  Result Date: 07/16/2021 CLINICAL DATA:  75 year old female with witnessed seizure at dialysis. EXAM: CT HEAD WITHOUT CONTRAST TECHNIQUE: Contiguous axial images were obtained from the base of the skull through the vertex without intravenous contrast. COMPARISON:  Brain MRI 05/12/2021.  Head CT 05/17/2021. FINDINGS: Brain: Advanced chronic ischemic disease, including confluent chronic right MCA and right PCA territory infarcts with encephalomalacia. Right side brainstem Wallerian degeneration. Small  chronic infarcts in the bilateral thalami and cerebellum. Patchy and confluent bilateral cerebral white matter hypodensity. Stable gray-white matter differentiation throughout the brain. No midline shift, ventriculomegaly, mass effect, evidence of mass lesion, intracranial hemorrhage or evidence of cortically based acute infarction. Vascular: Calcified atherosclerosis at the skull base. No suspicious intracranial vascular hyperdensity. Skull: Chronic right hemi craniectomy. Stable visualized osseous structures. Sinuses/Orbits: Visualized paranasal sinuses and mastoids are stable and well aerated. Other: No acute orbit or scalp soft tissue finding. Postoperative changes to the left globe, and right scalp. IMPRESSION: 1. No acute intracranial abnormality. 2. Stable appearance of prior right craniectomy and advanced underlying chronic ischemic disease. Electronically Signed   By: Genevie Ann M.D.   On: 07/16/2021 05:43   MR BRAIN WO CONTRAST  Result Date: 07/22/2021 CLINICAL DATA:  Altered mental status.  Delirium.  EXAM: MRI HEAD WITHOUT CONTRAST TECHNIQUE: Multiplanar, multiecho pulse sequences of the brain and surrounding structures were obtained without intravenous contrast. COMPARISON:  Head CT from 2 days ago FINDINGS: Brain: No acute infarction, hemorrhage, hydrocephalus, extra-axial collection or mass lesion. Sizable remote right MCA territory infarct centered on the insula and lateral frontal lobe with wallerian atrophy seen into the brainstem. Chronic small vessel ischemia with white matter gliosis and chronic lacunar infarcts at the deep gray nuclei and brainstem. Numerous small remote bilateral cerebellar infarcts. Generalized brain atrophy. Vascular: Preserved flow voids Skull and upper cervical spine: Right-sided craniotomy overlying the patient's prior infarct. Sinuses/Orbits: Left cataract resection IMPRESSION: 1. No acute or reversible finding. 2. Brain atrophy and advanced chronic ischemic injury. Electronically Signed   By: Monte Fantasia M.D.   On: 07/22/2021 05:17   DG CHEST PORT 1 VIEW  Result Date: 07/25/2021 CLINICAL DATA:  Altered mental status. EXAM: PORTABLE CHEST 1 VIEW COMPARISON:  07/20/2021 and older studies. FINDINGS: Cardiac silhouette normal in size. No mediastinal or hilar masses. Stable right internal jugular tunneled dual lumen central venous catheter, tip in the right atrium. Clear lungs.  No pleural effusion or pneumothorax. Skeletal structures grossly intact. IMPRESSION: No acute cardiopulmonary disease. Electronically Signed   By: Lajean Manes M.D.   On: 07/25/2021 16:21   DG CHEST PORT 1 VIEW  Result Date: 07/20/2021 CLINICAL DATA:  Confusion EXAM: PORTABLE CHEST 1 VIEW COMPARISON:  05/17/2021 FINDINGS: Right neck catheter tip overlies the right atrium. Unchanged, enlarged cardiac silhouette. No focal airspace disease. No large pleural effusion or visible pneumothorax. Bilateral shoulder degenerative changes. No acute osseous abnormality. IMPRESSION: No evidence of acute  cardiopulmonary disease. Electronically Signed   By: Maurine Simmering M.D.   On: 07/20/2021 16:39   EEG adult  Result Date: 07/21/2021 Lora Havens, MD     07/22/2021  9:44 AM Patient Name: Kirsten Mcmahon MRN: [id number] Epilepsy Attending: Lora Havens Referring Physician/Provider: Clance Boll, NP Duration: 07/20/2021 2035 to 07/21/2021 1743 Patient history: 75 yo female who presented with breakthrough seizure provoked by missing Keppra dose.  EEG to evaluate for seizures. Level of alertness: Awake, asleep AEDs during EEG study: VPA Technical aspects: This EEG study was done with scalp electrodes positioned according to the 10-20 International system of electrode placement. Electrical activity was acquired at a sampling rate of 500Hz  and reviewed with a high frequency filter of 70Hz  and a low frequency filter of 1Hz . EEG data were recorded continuously and digitally stored. Description: No posterior dominant rhythm was seen.  Sleep was characterized by vertex waves, sleep spindles (12-14 Hz, maximal frontocentral region. EEG showed continuous generalized and lateralized  right hemisphere 3 to 6 Hz theta-delta slowing. There are also sharply contoured waves in the right centro-parietal region consistent with underlying breach artifact.  Hyperventilation and photic stimulation were not performed.   ABNORMALITY -Breach artifact, right centro-parietal region -Continuous slow, generalized and lateralized right hemisphere IMPRESSION: This study is suggestive of cortical dysfunction in right centro-parietal region consistent with prior craniotomy and underlying stroke.there is also moderate degree of encephalopathy, nonspecific etiology.  No seizures or definite epileptiform discharges were seen throughout the recording. Lora Havens   EEG adult  Result Date: 07/17/2021 Lora Havens, MD     07/17/2021  2:26 PM Patient Name: Kirsten Mcmahon MRN: [id number] Epilepsy Attending: Lora Havens  Referring Physician/Provider: Dr Shelly Coss Date: 07/17/2021 Duration: 23.53 mins Patient history: 75 year old female with history of epilepsy who presented with breakthrough seizure as well as altered mental status.  EEG to evaluate for seizures. Level of alertness: Awake, asleep AEDs during EEG study: LEV Technical aspects: This EEG study was done with scalp electrodes positioned according to the 10-20 International system of electrode placement. Electrical activity was acquired at a sampling rate of 500Hz  and reviewed with a high frequency filter of 70Hz  and a low frequency filter of 1Hz . EEG data were recorded continuously and digitally stored. Description: No posterior dominant rhythm was seen. Sleep was characterized by vertex waves, sleep spindles (12 to 14 Hz), maximal frontocentral region.  EEG showed continuous generalized polymorphic 3 to 6 Hz theta-delta slowing. There is also sharply contoured 3 to 5 Hz theta-delta slowing in right frontal region consistent with underlying breech artifact.  Hyperventilation and photic stimulation were not performed.   ABNORMALITY - Continuous slow, generalized - Breach artifact, right frontal region  IMPRESSION: This study is suggestive of cortical dysfunction in right frontal region consistent with prior craniotomy.  Additionally there is evidence of moderate diffuse encephalopathy, nonspecific etiology.  No seizures or definite epileptiform discharges were seen throughout the recording. [name]   Overnight EEG with video  Result Date: 07/28/2021 Lora Havens, MD     07/29/2021  9:18 AM Patient Name: Kirsten Mcmahon MRN: [id number] Epilepsy Attending: Lora Havens Referring Physician/Provider: Dr Roland Rack Duration: 07/27/2021 1846 to 07/28/2021 1755  Patient history: 75 yo female who presented with breakthrough seizure provoked by missing Keppra dose.  EEG to evaluate for seizures.  Level of alertness: Awake, asleep  AEDs during EEG  study: LCM  Technical aspects: This EEG study was done with scalp electrodes positioned according to the 10-20 International system of electrode placement. Electrical activity was acquired at a sampling rate of 500Hz  and reviewed with a high frequency filter of 70Hz  and a low frequency filter of 1Hz . EEG data were recorded continuously and digitally stored.  Description: No posterior dominant rhythm was seen.  Sleep was characterized by vertex waves, sleep spindles (12-14 Hz, maximal frontocentral region. EEG showed continuous generalized and lateralized right hemisphere 3 to 6 Hz theta-delta slowing. There are also sharply contoured waves in the right centro-parietal region consistent with underlying breach artifact.  Hyperventilation and photic stimulation were not performed.    ABNORMALITY -Breach artifact, right centro-parietal region -Continuous slow, generalized and lateralized right hemisphere  IMPRESSION: This study is suggestive of cortical dysfunction in right centro-parietal region consistent with prior craniotomy and underlying stroke.there is also moderate degree of encephalopathy, nonspecific etiology.  No seizures or definite epileptiform discharges were seen throughout the recording. Lora Havens    Microbiology: Recent Results (from  the past 240 hour(s))  Culture, blood (routine x 2)     Status: None   Collection Time: 07/25/21  4:38 PM   Specimen: BLOOD RIGHT HAND  Result Value Ref Range Status   Specimen Description BLOOD RIGHT HAND  Final   Special Requests   Final    BOTTLES DRAWN AEROBIC AND ANAEROBIC Blood Culture results may not be optimal due to an inadequate volume of blood received in culture bottles   Culture   Final    NO GROWTH 5 DAYS Performed at [facility], [place] [address], [place], [place] [address]    Report Status 07/30/2021 FINAL  Final  Culture, blood (routine x 2)     Status: None   Collection Time: 07/25/21  4:42 PM   Specimen: BLOOD  Result Value  Ref Range Status   Specimen Description BLOOD RIGHT ANTECUBITAL  Final   Special Requests   Final    BOTTLES DRAWN AEROBIC AND ANAEROBIC Blood Culture adequate volume   Culture   Final    NO GROWTH 5 DAYS Performed at [facility], [place] [address] [place], [place] [address]    Report Status 07/30/2021 FINAL  Final     Labs: CBC: Recent Labs  Lab 07/27/21 0945 07/28/21 0346 07/29/21 1335 07/30/21 0426  WBC 5.5 7.2 5.5 6.9  HGB 9.8* 10.6* 9.5* 9.8*  HCT 31.9* 34.4* 29.3* 29.7*  MCV 92.2 91.7 87.7 87.6  PLT 177 119* 212 914   Basic Metabolic Panel: Recent Labs  Lab 07/26/21 0915 07/26/21 1635 07/27/21 0716 07/27/21 0945 07/27/21 1753 07/28/21 0346 07/29/21 1335 07/30/21 0426  NA  --   --   --  129*  --  130* 125* 125*  K  --   --   --  3.9  --  4.7 3.3* 3.7  CL  --   --   --  98  --  95* 93* 92*  CO2  --   --   --  24  --  26 25 24   GLUCOSE  --   --   --  113*  --  68* 172* 88  BUN  --   --   --  32*  --  19 31* 33*  CREATININE  --   --   --  6.51*  --  4.46* 5.66* 5.67*  CALCIUM  --   --   --  8.2*  --  7.9* 8.3* 8.8*  MG 2.0 2.1 2.1  --  2.0  --   --   --   PHOS 4.3 4.0 4.6 4.5 2.5  --  3.4  --    Liver Function Tests: Recent Labs  Lab 07/27/21 0945 07/29/21 1335  ALBUMIN 2.1* 2.2*   CBG: Recent Labs  Lab 07/30/21 1527 07/30/21 2017 07/30/21 2351 07/31/21 0351 07/31/21 0833  GLUCAP 183* 117* 143* 100* 72    Time spent: 35 minutes  Signed:  Berle Mull  [facility]  07/31/2021

## 2021-07-31 NOTE — Plan of Care (Signed)
  Problem: Education: Goal: Knowledge of General Education information will improve Description: Including pain rating scale, medication(s)/side effects and non-pharmacologic comfort measures Outcome: Adequate for Discharge   Problem: Health Behavior/Discharge Planning: Goal: Ability to manage health-related needs will improve Outcome: Adequate for Discharge   Problem: Clinical Measurements: Goal: Ability to maintain clinical measurements within normal limits will improve Outcome: Adequate for Discharge Goal: Will remain free from infection Outcome: Adequate for Discharge Goal: Diagnostic test results will improve Outcome: Adequate for Discharge Goal: Cardiovascular complication will be avoided Outcome: Adequate for Discharge   Problem: Activity: Goal: Risk for activity intolerance will decrease Outcome: Adequate for Discharge   Problem: Nutrition: Goal: Adequate nutrition will be maintained Outcome: Adequate for Discharge   Problem: Nutrition: Goal: Adequate nutrition will be maintained Outcome: Adequate for Discharge   Problem: Pain Managment: Goal: General experience of comfort will improve Outcome: Adequate for Discharge   Problem: Safety: Goal: Ability to remain free from injury will improve Outcome: Adequate for Discharge   Problem: Health Behavior/Discharge Planning: Goal: Compliance with prescribed medication regimen will improve Outcome: Adequate for Discharge   Problem: Coping: Goal: Ability to adjust to condition or change in health will improve Outcome: Adequate for Discharge Goal: Ability to identify appropriate support needs will improve Outcome: Adequate for Discharge   Problem: Clinical Measurements: Goal: Complications related to the disease process, condition or treatment will be avoided or minimized Outcome: Adequate for Discharge Goal: Diagnostic test results will improve Outcome: Adequate for Discharge   Problem: Safety: Goal: Verbalization  of understanding the information provided will improve Outcome: Adequate for Discharge   Problem: Self-Concept: Goal: Level of anxiety will decrease Outcome: Adequate for Discharge Goal: Ability to verbalize feelings about condition will improve Outcome: Adequate for Discharge   Problem: Safety: Goal: Non-violent Restraint(s) Outcome: Adequate for Discharge

## 2021-07-31 NOTE — NC FL2 (Signed)
MEDICAID FL2 LEVEL OF CARE SCREENING TOOL     IDENTIFICATION  Patient Name: Kirsten Mcmahon Birthdate: 13-Dec-1945 Sex: female Admission Date (Current Location): 07/15/2021  [facility] and [place] Number:  Herbalist and Address:  [facility], [place] [address], [place], [place] [address]      Provider Number: [id number]  Attending Physician Name and Address:  Lavina Hamman, MD  Relative Name and Phone Number:  Asa Fath, [phone]    Current Level of Care: Hospital Recommended Level of Care: [place] Prior Approval Number:    Date Approved/Denied:   PASRR Number: [id number]  Discharge Plan: SNF    Current Diagnoses: Patient Active Problem List   Diagnosis Date Noted   Seizure ([place]) 07/17/2021   Mixed diabetic hyperlipidemia associated with type 2 diabetes mellitus ([place]) 07/16/2021   Breakthrough seizure ([place]) 07/15/2021   Recent cerebrovascular accident (CVA) 05/17/2021   History of CVA with residual deficit 05/17/2021   Cerebral embolism with cerebral infarction 05/13/2021   Physical deconditioning 04/30/2021   Generalized muscle weakness 04/30/2021   Pressure injury of skin 04/29/2021   AKI (acute kidney injury) ([place]) 04/28/2021   Streptococcal sepsis, unspecified ([place]) 04/18/2021   Streptococcal bacteremia 04/18/2021   Leukocytosis 04/17/2021   Fever 04/17/2021   End-stage renal disease on hemodialysis ([place])    Acute kidney injury superimposed on CKD ([place]) 04/09/2021   ARF (acute renal failure) ([place]) 01/18/2021   Fall 01/17/2021   Essential hypertension    Hypothyroidism    Stroke ([place])    GERD (gastroesophageal reflux disease)    Anemia in chronic kidney disease (CKD)    Acute metabolic encephalopathy    Acute renal failure superimposed on stage 3b chronic kidney disease (HCC)    Type 2 diabetes mellitus with ESRD (end-stage renal disease) ([place])    Abnormal mammogram of left breast  07/30/2018   Closed displaced oblique fracture of shaft of left humerus 03/14/2018   Osteopenia 01/20/2018   Multiple thyroid nodules 07/13/2017   Tracheal deviation 07/13/2017   Malignant neoplasm of upper-outer quadrant of right breast in female, estrogen receptor positive ([place]) 01/20/2017   Primary vulvar squamous cell carcinoma ([place]) 01/20/2017    Orientation RESPIRATION BLADDER Height & Weight     Self  Normal Incontinent, External catheter Weight:  (floor to weigh) Height:     BEHAVIORAL SYMPTOMS/MOOD NEUROLOGICAL BOWEL NUTRITION STATUS      Incontinent Diet (See DC summary)  AMBULATORY STATUS COMMUNICATION OF NEEDS Skin   Total Care Verbally PU Stage and Appropriate Care (PI stage 2 Buttocks w/ foam dressing)                       Personal Care Assistance Level of Assistance  Bathing, Feeding, Dressing Bathing Assistance: Maximum assistance Feeding assistance: Maximum assistance Dressing Assistance: Maximum assistance     Functional Limitations Info  Sight, Hearing, Speech Sight Info: Adequate Hearing Info: Adequate Speech Info: Impaired    SPECIAL CARE FACTORS FREQUENCY  PT (By licensed PT), OT (By licensed OT)     PT Frequency: 5x week OT Frequency: 5x week            Contractures Contractures Info: Not present    Additional Factors Info  Code Status, Allergies Code Status Info: Full Allergies Info: Contrast Media (Iodinated Diagnostic Agents)   Latex   Shellfish-derived Products   Levemir (Insulin Detemir)  Current Medications (07/31/2021):  This is the current hospital active medication list Current Facility-Administered Medications  Medication Dose Route Frequency Provider Last Rate Last Admin   (feeding supplement) PROSource Plus liquid 30 mL  30 mL Oral TID BM Adelfa Koh, NP   30 mL at 07/31/21 0830   acetaminophen (TYLENOL) tablet 650 mg  650 mg Oral Q6H PRN Vernelle Emerald, MD   650 mg at 07/18/21 1816   Or    acetaminophen (TYLENOL) suppository 650 mg  650 mg Rectal Q6H PRN Shalhoub, Sherryll Burger, MD       aspirin chewable tablet 81 mg  81 mg Oral Daily Shelly Coss, MD   81 mg at 07/31/21 0830   calcitRIOL (ROCALTROL) capsule 0.5 mcg  0.5 mcg Oral Q M,W,F-HD Valentina Gu, NP   0.5 mcg at 07/29/21 1138   Chlorhexidine Gluconate Cloth 2 % PADS 6 each  6 each Topical Q0600 Valentina Gu, NP   6 each at 07/31/21 [phone]   feeding supplement (BOOST / RESOURCE BREEZE) liquid 1 Container  1 Container Oral TID BM Edwin Dada, MD   1 Container at 07/31/21 0832   heparin injection 5,000 Units  5,000 Units Subcutaneous Q8H Shalhoub, Sherryll Burger, MD   5,000 Units at 07/31/21 4665   labetalol (NORMODYNE) injection 10 mg  10 mg Intravenous Q2H PRN Shelly Coss, MD       lacosamide (VIMPAT) tablet 100 mg  100 mg Oral BID Lavina Hamman, MD   100 mg at 07/31/21 0830   latanoprost (XALATAN) 0.005 % ophthalmic solution 1 drop  1 drop Both Eyes QHS Shalhoub, Sherryll Burger, MD   1 drop at 07/27/21 2153   letrozole [facility]) tablet 2.5 mg  2.5 mg Oral Daily Shalhoub, Sherryll Burger, MD   2.5 mg at 07/31/21 0830   levothyroxine (SYNTHROID) tablet 112 mcg  112 mcg Oral Q0600 Vernelle Emerald, MD   112 mcg at 07/31/21 9935   magic mouthwash  5 mL Oral TID Earlie Counts, NP   5 mL at 07/31/21 0829   metoprolol succinate (TOPROL-XL) 24 hr tablet 50 mg  50 mg Oral Daily Shalhoub, Sherryll Burger, MD   50 mg at 07/31/21 0830   multivitamin (RENA-VIT) tablet 1 tablet  1 tablet Oral QHS Dana Allan I, MD   1 tablet at 07/30/21 2220   ondansetron (ZOFRAN) tablet 4 mg  4 mg Oral Q6H PRN Vernelle Emerald, MD       Or   ondansetron [facility]) injection 4 mg  4 mg Intravenous Q6H PRN Shalhoub, Sherryll Burger, MD   4 mg at 07/17/21 2025   polyethylene glycol (MIRALAX / GLYCOLAX) packet 17 g  17 g Oral Daily PRN Vernelle Emerald, MD   17 g at 07/30/21 7017     Discharge Medications: Please see discharge summary for a list of  discharge medications.  Relevant Imaging Results:  Relevant Lab Results:   Additional Information SS# [id number]  Coralee [place], [place]

## 2021-07-31 NOTE — Progress Notes (Signed)
[facility] Progress Note  Subjective: seen in room, for dc home today  Vitals:   07/30/21 1811 07/30/21 2018 07/30/21 2332 07/31/21 0334  BP: (!) 117/49 131/82 113/64 116/70  Pulse: 78 84 80 84  Resp: 15 16 16 16   Temp: 98.3 F (36.8 C) 98.4 F (36.9 C) 98.1 F (36.7 C) 97.6 F (36.4 C)  TempSrc: Oral Axillary Axillary Axillary  SpO2: 96% 100% 100% 98%  Weight:        Exam: General: frail-appearing, opens eyes and responds minimally  Lungs: clear bilaterally Heart: RRR. No murmur, rubs or gallops.  Abdomen: soft, nontender, +BS Lower extremities:no edema  Neuro:  Ox 1 Dialysis Access: TDC    OP HD: G-O MWF   3h 83min  56kg  400/1.5  3K/2.5 bath  TDC  Hep none  -Mircera 150 mcg IV Q 2 weeks (last dose 07/15/2021) -Venofer 50 mg IV weekly (last dose 07/15/2021) -Calcitriol 0.5 mcg PO TIW   Assessment/ Plan: Seizure-experienced a seizure during her last outpatient HD on 07/15/21.  AED's per neurology.  Altered Mental Status - back to baseline. CT head (-) acute intracranial abnormalities x 2, last 07/20/21. Seen by neurology who says she is back to her baseline, they have signed off.  ESRD - usual HD MWF.  HD today off schedule. Due to staffing issues we are doing stable pts only 2 times this week. HD mon/ Thursday this week. Will resume HD as outpatient on Monday.  Disposition - Given MS back to baseline and pt sitting in chair for dialysis, and family has all the equipment at home (hoyer life, etc.) and wishes to continue full medical Rx, no indication for hospice transition at this time. OK to dc home.  Hypertension/volume  - Patient euvolemic on exam, Bps improving-continue  Anemia of CKD - Hgb 9.4-recently received ESA and Fe in outpatient-monitor trends Secondary Hyperparathyroidism -  Ca 8.5-continue PO calcitriol.  Nutrition - Continue carb-modified renal diet. Albumin 2.6-continue protein supplements. Dispo - probable dc home today      Kelly Splinter 07/31/2021, 11:49 AM   Recent Labs  Lab 07/27/21 1753 07/28/21 0346 07/29/21 1335 07/30/21 0426  K  --    < > 3.3* 3.7  BUN  --    < > 31* 33*  CREATININE  --    < > 5.66* 5.67*  CALCIUM  --    < > 8.3* 8.8*  PHOS 2.5  --  3.4  --   HGB  --    < > 9.5* 9.8*   < > = values in this interval not displayed.    Inpatient medications:  (feeding supplement) PROSource Plus  30 mL Oral TID BM   aspirin  81 mg Oral Daily   calcitRIOL  0.5 mcg Oral Q M,W,F-HD   Chlorhexidine Gluconate Cloth  6 each Topical Q0600   feeding supplement  1 Container Oral TID BM   heparin  5,000 Units Subcutaneous Q8H   lacosamide  100 mg Oral BID   latanoprost  1 drop Both Eyes QHS   letrozole  2.5 mg Oral Daily   levothyroxine  112 mcg Oral Q0600   magic mouthwash  5 mL Oral TID   metoprolol succinate  50 mg Oral Daily   multivitamin  1 tablet Oral QHS     acetaminophen **OR** acetaminophen, labetalol, ondansetron **OR** ondansetron (ZOFRAN) IV, polyethylene glycol

## 2021-08-01 ENCOUNTER — Telehealth: Payer: Self-pay | Admitting: Nephrology

## 2021-08-01 NOTE — Telephone Encounter (Signed)
Transition of Care Contact from [place]   Date of Discharge: 07/31/21 Date of Contact: 08/01/21 Method of contact: phone Talked to patient son [name]   Patient contacted to discuss transition of care form recent hospitaliztion. Patient was admitted to [facility] from 8/10 to 07/31/21 with the discharge diagnosis of breakthrough seizures, AMS.     Medication changes were reviewed, [facility] sent meds but did not send new seizure medicine lacosamide.  Attempted to call [facility] but closed over the weekend.  [facility] until able to get full Rx from [facility].   Patient will follow up with is outpatient dialysis center 08/03/21.   Other follow up needs include: none identified.   Jen Mow, PA-C [facility] Pager: [phone]

## 2021-08-03 DIAGNOSIS — F05 Delirium due to known physiological condition: Secondary | ICD-10-CM | POA: Insufficient documentation

## 2021-08-05 ENCOUNTER — Encounter (HOSPITAL_COMMUNITY): Payer: Medicare (Managed Care)

## 2021-08-05 ENCOUNTER — Ambulatory Visit: Payer: Medicare (Managed Care) | Admitting: Vascular Surgery

## 2021-08-05 ENCOUNTER — Other Ambulatory Visit (HOSPITAL_COMMUNITY): Payer: Medicare (Managed Care)

## 2021-08-27 ENCOUNTER — Encounter: Payer: Self-pay | Admitting: Neurology

## 2021-08-27 ENCOUNTER — Ambulatory Visit: Payer: Medicaid Other | Admitting: Neurology

## 2021-09-03 ENCOUNTER — Ambulatory Visit (INDEPENDENT_AMBULATORY_CARE_PROVIDER_SITE_OTHER): Payer: Medicare (Managed Care) | Admitting: Vascular Surgery

## 2021-09-03 ENCOUNTER — Other Ambulatory Visit: Payer: Self-pay

## 2021-09-03 ENCOUNTER — Ambulatory Visit (INDEPENDENT_AMBULATORY_CARE_PROVIDER_SITE_OTHER)
Admission: RE | Admit: 2021-09-03 | Discharge: 2021-09-03 | Disposition: A | Discharge status: Home / Self Care | Payer: Medicare (Managed Care) | Source: Ambulatory Visit | Attending: Vascular Surgery | Admitting: Vascular Surgery

## 2021-09-03 ENCOUNTER — Encounter: Payer: Self-pay | Admitting: Vascular Surgery

## 2021-09-03 ENCOUNTER — Ambulatory Visit (HOSPITAL_COMMUNITY)
Admission: RE | Admit: 2021-09-03 | Discharge: 2021-09-03 | Disposition: A | Discharge status: Home / Self Care | Payer: Medicare (Managed Care) | Source: Ambulatory Visit | Attending: Vascular Surgery | Admitting: Vascular Surgery

## 2021-09-03 VITALS — BP 126/76 | HR 89 | Temp 98.0°F | Resp 20 | Ht 62.0 in | Wt 125.0 lb

## 2021-09-03 DIAGNOSIS — N186 End stage renal disease: Secondary | ICD-10-CM

## 2021-09-03 DIAGNOSIS — Z992 Dependence on renal dialysis: Secondary | ICD-10-CM | POA: Insufficient documentation

## 2021-09-03 NOTE — H&P (View-Only) (Signed)
ASSESSMENT & PLAN   END-STAGE RENAL DISEASE: Based on the vein map the patient is not a candidate for AV fistula.  In addition there is evidence of upper extremity arterial occlusive disease on the left and I would recommend an AV graft in the right arm.  I have discussed the indications for the procedure and the potential complications with the patient and her son on the phone today.  She dialyzes on Monday Wednesdays and Fridays so we will schedule this online nondialysis day in October.  I explained that the graft can be used in 1 month and once we know the graft is working well, the catheter can be removed.  REASON FOR CONSULT:    To evaluate for hemodialysis access.  The consult is requested by Dr. Harrie Jeans.  HPI:   Kirsten Mcmahon is a 75 y.o. female who was referred for evaluation for hemodialysis access.  I reviewed the records from the referring office.  The patient has end-stage renal disease and had a first dialysis in May of this year.  Patient also has a history of type 2 diabetes, hypertension, history of a previous stroke.  She has a functioning catheter.  My history, the patient is right-handed.  She has had a previous right brain stroke associated with left arm weakness.  She has had no recent uremic symptoms including nausea vomiting palpitations or anorexia.  Past Medical History:  Diagnosis Date   Cancer [facility]) 2017   Right breast   Chronic kidney disease    Depression    GERD (gastroesophageal reflux disease)    Glaucoma    Hemiparesis (HCC)    left side   High cholesterol    History of seizure    x 1 - after a spider bite   History of stroke with residual deficit    left-side weakness   Hypertension    states BP under control with meds., has been on med. x 2 yr.   Hypothyroidism    Non-insulin dependent type 2 diabetes mellitus (HCC)    Overactive bladder    Stroke ([place])    1998 weakness on left side    Family History  Problem Relation Age of Onset    Cancer Brother        possible prostate cancer per her daughter   Breast cancer Neg Hx     SOCIAL HISTORY: Social History   Tobacco Use   Smoking status: Never   Smokeless tobacco: Never  Substance Use Topics   Alcohol use: No    Allergies  Allergen Reactions   Contrast Media [Iodinated Diagnostic Agents]     Other reaction(s): NO ALLERGY   Latex     Other reaction(s): NO ALLERGY   Shellfish-Derived Products     Other reaction(s): NO ALLERGY   Levemir [Insulin Detemir] Itching    Current Outpatient Medications  Medication Sig Dispense Refill   aspirin 81 MG chewable tablet Chew 1 tablet (81 mg total) by mouth daily. 60 tablet 0   calcitRIOL (ROCALTROL) 0.5 MCG capsule Take 1 capsule (0.5 mcg total) by mouth every Monday, Wednesday, and Friday with hemodialysis. 30 capsule 0   dextrose (GLUTOSE) 40 % GEL Place 28 mLs inside cheek as needed (for suspected low sugars). 37.5 g 2   lacosamide 100 MG TABS Take 1 tablet (100 mg total) by mouth 2 (two) times daily. 60 tablet 0   latanoprost (XALATAN) 0.005 % ophthalmic solution Place 1 drop into both eyes at bedtime. 2.5  mL 0   letrozole (FEMARA) 2.5 MG tablet Take 1 tablet (2.5 mg total) by mouth daily. 90 tablet 3   levothyroxine (SYNTHROID, LEVOTHROID) 112 MCG tablet Take 1 tablet (112 mcg total) by mouth daily before breakfast. 90 tablet 0   metoprolol succinate (TOPROL-XL) 25 MG 24 hr tablet Take 50 mg by mouth daily.     multivitamin (RENA-VIT) TABS tablet Take 1 tablet by mouth at bedtime. 30 tablet 0   Nutritional Supplements (,FEEDING SUPPLEMENT, PROSOURCE PLUS) liquid Take 30 mLs by mouth 3 (three) times daily between meals. 1000 mL 0   Nutritional Supplements (FEEDING SUPPLEMENT, BOOST BREEZE,) LIQD Take 1 each by mouth 3 (three) times daily. 10000 mL 0   No current facility-administered medications for this visit.    REVIEW OF SYSTEMS:  [X]  denotes positive finding, [ ]  denotes negative finding Cardiac  Comments:   Chest pain or chest pressure:    Shortness of breath upon exertion:    Short of breath when lying flat:    Irregular heart rhythm:        Vascular    Pain in calf, thigh, or hip brought on by ambulation:    Pain in feet at night that wakes you up from your sleep:     Blood clot in your veins:    Leg swelling:         Pulmonary    Oxygen at home:    Productive cough:     Wheezing:         Neurologic    Sudden weakness in arms or legs:     Sudden numbness in arms or legs:     Sudden onset of difficulty speaking or slurred speech:    Temporary loss of vision in one eye:     Problems with dizziness:         Gastrointestinal    Blood in stool:     Vomited blood:         Genitourinary    Burning when urinating:     Blood in urine:        Psychiatric    Major depression:         Hematologic    Bleeding problems:    Problems with blood clotting too easily:        Skin    Rashes or ulcers:        Constitutional    Fever or chills:    -  PHYSICAL EXAM:   Vitals:   09/03/21 1342  BP: 126/76  Pulse: 89  Resp: 20  Temp: 98 F (36.7 C)  SpO2: 97%  Weight: 125 lb (56.7 kg)  Height: 5\' 2"  (1.575 m)   Body mass index is 22.86 kg/m. GENERAL: The patient is a well-nourished female, in no acute distress. The vital signs are documented above. CARDIAC: There is a regular rate and rhythm.  VASCULAR: I do not detect carotid bruits. She has a palpable radial pulse on the right with a diminished radial pulse on the left. She has no significant lower extremity swelling. PULMONARY: There is good air exchange bilaterally without wheezing or rales. MUSCULOSKELETAL: There are no major deformities. NEUROLOGIC: No focal weakness or paresthesias are detected. SKIN: There are no ulcers or rashes noted. PSYCHIATRIC: The patient has a normal affect.  DATA:    UPPER EXTREMITY ARTERIAL DUPLEX: I have independently interpreted her upper extremity arterial duplex scan.  On the  right side there is a biphasic radial and ulnar waveform  with the Doppler.  The brachial artery measures 5.2 mm in diameter.  On the left side there is a biphasic radial signal with the Doppler.  The ulnar signal could not be obtained.  The brachial artery measures 4 mm in diameter.  BILATERAL UPPER EXTREMITY VEIN MAP: I have independently interpreted her upper extremity vein map.  On the right side the forearm and upper arm cephalic vein does not look adequate in size.  Diameters range from 0.7 to 2.1 mm.  The basilic vein on the right is also inadequate.  On the left side the upper arm and forearm cephalic vein are very small and not adequate for access.  Likewise the basilic vein on the left is not adequate for access.   [name] [facility]

## 2021-09-03 NOTE — Progress Notes (Signed)
ASSESSMENT & PLAN   END-STAGE RENAL DISEASE: Based on the vein map the patient is not a candidate for AV fistula.  In addition there is evidence of upper extremity arterial occlusive disease on the left and I would recommend an AV graft in the right arm.  I have discussed the indications for the procedure and the potential complications with the patient and her son on the phone today.  She dialyzes on Monday Wednesdays and Fridays so we will schedule this online nondialysis day in October.  I explained that the graft can be used in 1 month and once we know the graft is working well, the catheter can be removed.  REASON FOR CONSULT:    To evaluate for hemodialysis access.  The consult is requested by Dr. Harrie Jeans.  HPI:   Kirsten Mcmahon is a 75 y.o. female who was referred for evaluation for hemodialysis access.  I reviewed the records from the referring office.  The patient has end-stage renal disease and had a first dialysis in May of this year.  Patient also has a history of type 2 diabetes, hypertension, history of a previous stroke.  She has a functioning catheter.  My history, the patient is right-handed.  She has had a previous right brain stroke associated with left arm weakness.  She has had no recent uremic symptoms including nausea vomiting palpitations or anorexia.  Past Medical History:  Diagnosis Date   Cancer [facility]) 2017   Right breast   Chronic kidney disease    Depression    GERD (gastroesophageal reflux disease)    Glaucoma    Hemiparesis (HCC)    left side   High cholesterol    History of seizure    x 1 - after a spider bite   History of stroke with residual deficit    left-side weakness   Hypertension    states BP under control with meds., has been on med. x 2 yr.   Hypothyroidism    Non-insulin dependent type 2 diabetes mellitus (HCC)    Overactive bladder    Stroke ([place])    1998 weakness on left side    Family History  Problem Relation Age of Onset    Cancer Brother        possible prostate cancer per her daughter   Breast cancer Neg Hx     SOCIAL HISTORY: Social History   Tobacco Use   Smoking status: Never   Smokeless tobacco: Never  Substance Use Topics   Alcohol use: No    Allergies  Allergen Reactions   Contrast Media [Iodinated Diagnostic Agents]     Other reaction(s): NO ALLERGY   Latex     Other reaction(s): NO ALLERGY   Shellfish-Derived Products     Other reaction(s): NO ALLERGY   Levemir [Insulin Detemir] Itching    Current Outpatient Medications  Medication Sig Dispense Refill   aspirin 81 MG chewable tablet Chew 1 tablet (81 mg total) by mouth daily. 60 tablet 0   calcitRIOL (ROCALTROL) 0.5 MCG capsule Take 1 capsule (0.5 mcg total) by mouth every Monday, Wednesday, and Friday with hemodialysis. 30 capsule 0   dextrose (GLUTOSE) 40 % GEL Place 28 mLs inside cheek as needed (for suspected low sugars). 37.5 g 2   lacosamide 100 MG TABS Take 1 tablet (100 mg total) by mouth 2 (two) times daily. 60 tablet 0   latanoprost (XALATAN) 0.005 % ophthalmic solution Place 1 drop into both eyes at bedtime. 2.5  mL 0   letrozole (FEMARA) 2.5 MG tablet Take 1 tablet (2.5 mg total) by mouth daily. 90 tablet 3   levothyroxine (SYNTHROID, LEVOTHROID) 112 MCG tablet Take 1 tablet (112 mcg total) by mouth daily before breakfast. 90 tablet 0   metoprolol succinate (TOPROL-XL) 25 MG 24 hr tablet Take 50 mg by mouth daily.     multivitamin (RENA-VIT) TABS tablet Take 1 tablet by mouth at bedtime. 30 tablet 0   Nutritional Supplements (,FEEDING SUPPLEMENT, PROSOURCE PLUS) liquid Take 30 mLs by mouth 3 (three) times daily between meals. 1000 mL 0   Nutritional Supplements (FEEDING SUPPLEMENT, BOOST BREEZE,) LIQD Take 1 each by mouth 3 (three) times daily. 10000 mL 0   No current facility-administered medications for this visit.    REVIEW OF SYSTEMS:  [X]  denotes positive finding, [ ]  denotes negative finding Cardiac  Comments:   Chest pain or chest pressure:    Shortness of breath upon exertion:    Short of breath when lying flat:    Irregular heart rhythm:        Vascular    Pain in calf, thigh, or hip brought on by ambulation:    Pain in feet at night that wakes you up from your sleep:     Blood clot in your veins:    Leg swelling:         Pulmonary    Oxygen at home:    Productive cough:     Wheezing:         Neurologic    Sudden weakness in arms or legs:     Sudden numbness in arms or legs:     Sudden onset of difficulty speaking or slurred speech:    Temporary loss of vision in one eye:     Problems with dizziness:         Gastrointestinal    Blood in stool:     Vomited blood:         Genitourinary    Burning when urinating:     Blood in urine:        Psychiatric    Major depression:         Hematologic    Bleeding problems:    Problems with blood clotting too easily:        Skin    Rashes or ulcers:        Constitutional    Fever or chills:    -  PHYSICAL EXAM:   Vitals:   09/03/21 1342  BP: 126/76  Pulse: 89  Resp: 20  Temp: 98 F (36.7 C)  SpO2: 97%  Weight: 125 lb (56.7 kg)  Height: 5\' 2"  (1.575 m)   Body mass index is 22.86 kg/m. GENERAL: The patient is a well-nourished female, in no acute distress. The vital signs are documented above. CARDIAC: There is a regular rate and rhythm.  VASCULAR: I do not detect carotid bruits. She has a palpable radial pulse on the right with a diminished radial pulse on the left. She has no significant lower extremity swelling. PULMONARY: There is good air exchange bilaterally without wheezing or rales. MUSCULOSKELETAL: There are no major deformities. NEUROLOGIC: No focal weakness or paresthesias are detected. SKIN: There are no ulcers or rashes noted. PSYCHIATRIC: The patient has a normal affect.  DATA:    UPPER EXTREMITY ARTERIAL DUPLEX: I have independently interpreted her upper extremity arterial duplex scan.  On the  right side there is a biphasic radial and ulnar waveform  with the Doppler.  The brachial artery measures 5.2 mm in diameter.  On the left side there is a biphasic radial signal with the Doppler.  The ulnar signal could not be obtained.  The brachial artery measures 4 mm in diameter.  BILATERAL UPPER EXTREMITY VEIN MAP: I have independently interpreted her upper extremity vein map.  On the right side the forearm and upper arm cephalic vein does not look adequate in size.  Diameters range from 0.7 to 2.1 mm.  The basilic vein on the right is also inadequate.  On the left side the upper arm and forearm cephalic vein are very small and not adequate for access.  Likewise the basilic vein on the left is not adequate for access.   [name] [facility]

## 2021-09-04 ENCOUNTER — Other Ambulatory Visit: Payer: Self-pay

## 2021-09-28 ENCOUNTER — Other Ambulatory Visit: Payer: Self-pay

## 2021-09-28 ENCOUNTER — Encounter (HOSPITAL_COMMUNITY): Payer: Self-pay | Admitting: Vascular Surgery

## 2021-09-28 NOTE — Progress Notes (Signed)
[name] was at dialysis when I called, patient's son, Kirsten Mcmahon answered the phone.  Kirsten Mcmahon states that his mother has not complained of chest pain or shortness of breath.  Kirsten Mcmahon denies having any s/s of Covid in th home. Patient denies any known exposure to Covid.   Ms Kirsten Mcmahon has a history of type II diabetes, she is not longer on medication. Kirsten Mcmahon reports that Ms Kirsten Mcmahon's CBG runs low. I instructed Kirsten Mcmahon to have patient check CBG in am , if CBG is less than 70, Ms Kirsten Mcmahon should take 1 tube of Glucose gel Kirsten Mcmahon reports there is glucose gel in the home.)  Check CBG in 15 minutes, if it is not greater than 70, call the number I gave him.  I instructed Kirsten Mcmahon to give patient a good wash up  with antibiotic soap, if it is available.  Dry off with a clean towel. Do not put lotion, powder, cologne or deodorant or makeup.No jewelry or piercings. No nail polish, artificial or acrylic nails. Wear clean clothes, brush your teeth. Glasses, contact lens,dentures or partials may not be worn in the OR. If you need to wear them, please bring a case for glasses, do not wear contacts or bring a case, the hospital does not have contact cases, dentures or partials will have to be removed , make sure they are clean, we will provide a denture cup to put them in. You will need some one to drive you home and a responsible person over the age of 71 to stay with you for the first 24 hours after surgery.

## 2021-09-28 NOTE — Anesthesia Preprocedure Evaluation (Addendum)
Anesthesia Evaluation  Patient identified by MRN, date of birth, ID band Patient awake    Reviewed: Allergy & Precautions, NPO status , Patient's Chart, lab work & pertinent test results, reviewed documented beta blocker date and time   Airway Mallampati: III  TM Distance: >3 FB Neck ROM: Full    Dental  (+) Dental Advisory Given   Pulmonary neg pulmonary ROS,    breath sounds clear to auscultation       Cardiovascular hypertension, Pt. on medications and Pt. on home beta blockers  Rhythm:Regular  TEE 05/15/21: IMPRESSIONS  1. +PFO, positive bubble study.  2. Left ventricular ejection fraction, by estimation, is 65 to 70%. The  left ventricle has normal function.  3. Right ventricular systolic function is normal. The right ventricular  size is normal.  4. No left atrial/left atrial appendage thrombus was detected.  5. The mitral valve is normal in structure. No evidence of mitral valve  regurgitation. No evidence of mitral stenosis.  6. The aortic valve is tricuspid. Aortic valve regurgitation is mild.  Mild aortic valve sclerosis is present, with no evidence of aortic valve  stenosis.  7. Evidence of atrial level shunting detected by color flow Doppler.  Agitated saline contrast bubble study was positive with shunting observed  within 3-6 cardiac cycles suggestive of interatrial shunt. There is a  small patent foramen ovale with  predominantly right to left shunting across the atrial septum.  - Conclusion(s)/Recommendation(s): No evidence of vegetation/infective  endocarditis on this transesophageal  echocardiogram. +PFO, positive bubble study.     Neuro/Psych Seizures -,  CVA, Residual Symptoms negative psych ROS   GI/Hepatic Neg liver ROS, GERD  ,  Endo/Other  diabetesHypothyroidism   Renal/GU ESRF and DialysisRenal diseaseLab Results      Component                Value               Date                       CREATININE               6.20 (H)            09/29/2021           Lab Results      Component                Value               Date                      K                        4.7                 09/29/2021                Musculoskeletal   Abdominal   Peds  Hematology  (+) Blood dyscrasia, anemia , Lab Results      Component                Value               Date                      WBC  6.9                 07/30/2021                HGB                      13.3                09/29/2021                HCT                      39.0                09/29/2021                MCV                      87.6                07/30/2021                PLT                      203                 07/30/2021              Anesthesia Other Findings History includes never smoker, HTN, DM2, HLD, CVA (1998 with left hemiparesis; ~ 05/12/21, TEE + PFO, ASA and statin recommended), brain aneurysm (s/p craniotomy/repair 1998), seizure, OSA, glaucoma, ESRD (started hemodialysis 04/10/21), Streptococcal bacteremia (04/2021, unclear source, s/p IV Vancomycin per ID, 05/15/21 TEE showed no vegetations, + PFO), GERD, breast cancer (right breast cancer s/p right breast lumpectomy 07/14/15; 08/03/18 left breast lumpectomy: fibrocystic changes with dense sclerosis & focal usual ductal hyperplasia, atypical lobular hyperplasia), right thyroid lobectomy (for adenomatous nodules/nodular goiter 07/15/17). Per oncology records, she underwent vulvectomy 03/25/15 for SCC.   Multiple admission between May-August 2022: - [place] admission 07/15/21-07/31/21 for breakthrough seizure during dialysis in setting of missed Keppra. Brain MRI negative for acute findings. EEG negative for seizure. Neurology consulted for combination of seizure and delirium/metabolic encephalopathy. Changed to Vimpat due to sleepiness. Nephrology managed ESRD. Received PT/OT/ST evaluations. Palliative Care consulted. (See 07/29/21 note  for full details). At that time, son [name] desiring full scope of care.  - [place] admission 07/20/47-1/85/63 acute metabolic encephalopathy likely from aspiration pneumonia (presented with N/V episode with AMS) which was treated with improvement. At that time, son declined Palliative Care consult or SNF.   - [place] admission 04/28/21-05/16/21 for encephalopathy felt to be due to uremia in the setting of missed HD. However, encephalopathy did not improve after dialysis, and 05/12/21 MRI showed a few punctate acute infarcts of the right temporal lobe, bilateral thalamus and left parietal lobe. Continued on HD for ESRD and IV antibiotics for recent Strep bacteremia history. Family agreed to TEE which showed no vegetations, but + PFO. Per Discharge Summary by Darliss Cheney, MD, "TEE completed and it ruled out any thrombus or vegetation but confirmed PFO. Neurology wants to make sure there is no DVT, Doppler lower extremity pending.They recommended to continue aspirin and statin per neurology.Her Doppler lower extremity is still is pending. If that will be positive, patient will be discharged on Eliquis and no aspirin. If negative, she will continue aspirin. Neurology also opined that her lacunar infarcts  are so small and likely are not the cause of her encephalopathy. Patient continued to have waxing and waning encephalopathy. No other source was identified."  05/16/21 LE venous Duplex showed no obvious DVT.   [facility] [place] admission 04/09/21-04/24/21 for worsening edema in setting of progressive CKD to ESRD, s/p right IJ TDC and started on HD 04/10/21. She developed Streptococcal bacteremia, no definite source identified. No obvious vegetations on TTE, but family initially declined TEE. ID consulted for antibiotics.   Reproductive/Obstetrics                            Anesthesia Physical Anesthesia Plan  ASA: 4  Anesthesia Plan: MAC   Post-op Pain Management:     Induction:   PONV Risk Score and Plan: 2 and Propofol infusion and Treatment may vary due to age or medical condition  Airway Management Planned: Nasal Cannula  Additional Equipment:   Intra-op Plan:   Post-operative Plan:   Informed Consent: I have reviewed the patients History and Physical, chart, labs and discussed the procedure including the risks, benefits and alternatives for the proposed anesthesia with the patient or authorized representative who has indicated his/her understanding and acceptance.     Dental advisory given  Plan Discussed with: CRNA and Anesthesiologist  Anesthesia Plan Comments: (PAT note written 09/28/2021 by Myra Gianotti, PA-C. )       Anesthesia Quick Evaluation

## 2021-09-28 NOTE — Progress Notes (Signed)
Anesthesia Chart Review:  Case: [id number] Date/Time: 09/29/21 0912   Procedure: INSERTION OF RIGHT ARM ARTERIOVENOUS (AV) GORE-TEX GRAFT (Right)   Anesthesia type: Choice   Pre-op diagnosis: ESRD   Location: [facility] OR ROOM 11 / [place] OR   Surgeons: Angelia Mould, MD       DISCUSSION: Patient is a 75 year old female scheduled for the above procedure.  She is currently dialyzing MWF via a right IJ TDC and needs permanent access.  History includes never smoker, HTN, DM2, HLD, CVA (1998 with left hemiparesis; ~ 05/12/21, TEE + PFO, ASA and statin recommended), brain aneurysm (s/p craniotomy/repair 1998), seizure, OSA, glaucoma, ESRD (started hemodialysis 04/10/21), Streptococcal bacteremia (04/2021, unclear source, s/p IV Vancomycin per ID, 05/15/21 TEE showed no vegetations, + PFO), GERD, breast cancer (right breast cancer s/p right breast lumpectomy 07/14/15; 08/03/18 left breast lumpectomy: fibrocystic changes with dense sclerosis & focal usual ductal hyperplasia, atypical lobular hyperplasia), right thyroid lobectomy (for adenomatous nodules/nodular goiter 07/15/17). Per oncology records, she underwent vulvectomy 03/25/15 for SCC.   Multiple admission between May-August 2022: - [place] admission 07/15/21-07/31/21 for breakthrough seizure during dialysis in setting of missed Keppra. Brain MRI negative for acute findings. EEG negative for seizure. Neurology consulted for combination of seizure and delirium/metabolic encephalopathy. Changed to Vimpat due to sleepiness. Nephrology managed ESRD. Received PT/OT/ST evaluations. Palliative Care consulted. (See 07/29/21 note for full details). At that time, son [name] desiring full scope of care.  - [place] admission 3/78/58-8/50/27 acute metabolic encephalopathy likely from aspiration pneumonia (presented with N/V episode with AMS) which was treated with improvement. At that time, son declined Palliative Care consult or SNF.   - [place] admission  04/28/21-05/16/21 for encephalopathy felt to be due to uremia in the setting of missed HD. However, encephalopathy did not improve after dialysis, and 05/12/21 MRI showed a few punctate acute infarcts of the right temporal lobe, bilateral thalamus and left parietal lobe. Continued on HD for ESRD and IV antibiotics for recent Strep bacteremia history. Family agreed to TEE which showed no vegetations, but + PFO. Per Discharge Summary by Darliss Cheney, MD, "TEE completed and it ruled out any thrombus or vegetation but confirmed PFO.  Neurology wants to make sure there is no DVT, Doppler lower extremity pending.  They recommended to continue aspirin and statin per neurology.  Her Doppler lower extremity is still is pending.  If that will be positive, patient will be discharged on Eliquis and no aspirin.  If negative, she will continue aspirin.  Neurology also opined that her lacunar infarcts are so small and likely are not the cause of her encephalopathy.  Patient continued to have waxing and waning encephalopathy.  No other source was identified."  05/16/21 LE venous Duplex showed no obvious DVT.   [facility] [place] admission 04/09/21-04/24/21 for worsening edema in setting of progressive CKD to ESRD, s/p right IJ TDC and started on HD 04/10/21. She developed Streptococcal bacteremia, no definite source identified. No obvious vegetations on TTE, but family initially declined TEE. ID consulted for antibiotics.     Patient to continue ASA per [facility].  She is same day work-up, so anesthesia team to evaluate on the day of surgery.   VS:  BP Readings from Last 3 Encounters:  09/03/21 126/76  07/31/21 132/70  05/24/21 (!) 152/93   Pulse Readings from Last 3 Encounters:  09/03/21 89  07/31/21 73  05/24/21 90     PROVIDERS: [facility], [place], Cecille Rubin, MD is nephrologist [place],  Loleta Dicker, MD is HEM-ONC   LABS: For day of surgery. As of 07/30/21, Na 125, K 3.7, Cr 5.67, H/H 9.8/29.7, PLT 203.    OTHER:   Overnight EEG 07/28/21: IMPRESSION: This study is suggestive of cortical dysfunction in right centro-parietal region consistent with prior craniotomy and underlying stroke.there is also moderate degree of encephalopathy, nonspecific etiology.  No seizures or definite epileptiform discharges were seen throughout the recording.   IMAGES: 1V CXR 07/25/21: FINDINGS: - Cardiac silhouette normal in size. No mediastinal or hilar masses. Stable right internal jugular tunneled dual lumen central venous catheter, tip in the right atrium. - Clear lungs.  No pleural effusion or pneumothorax. - Skeletal structures grossly intact. IMPRESSION: No acute cardiopulmonary disease.  MRI Brain 07/22/21: FINDINGS: Brain:  - No acute infarction, hemorrhage, hydrocephalus, extra-axial collection or mass lesion. - Sizable remote right MCA territory infarct centered on the insula and lateral frontal lobe with wallerian atrophy seen into the brainstem. - Chronic small vessel ischemia with white matter gliosis and chronic lacunar infarcts at the deep gray nuclei and brainstem. Numerous small remote bilateral cerebellar infarcts. - Generalized brain atrophy. Vascular:  - Preserved flow voids Skull and upper cervical spine:  - Right-sided craniotomy overlying the patient's prior infarct. Sinuses/Orbits:  - Left cataract resection IMPRESSION: 1. No acute or reversible finding. 2. Brain atrophy and advanced chronic ischemic injury.  MRA Head 05/13/21: IMPRESSION: 1. Severely degraded study due to motion artifact. Most images are essentially nondiagnostic. 2. Flow related enhancement seen in the intracranial internal carotid arteries, proximal bilateral M1/MCA segments and A1/ACA segments. 3. Flow related enhancement in the bilateral vertebral arteries and basilar artery. 4. Remainder of the vessels cannot be evaluated.   EKG: 07/15/21: Sinus rhythm Inferior infarct, old Anterior infarct,  old Similar to previous tracing Confirmed by Wynona Dove ([id number]) on 07/15/2021 5:27:43 PM   CV: BLE Venous US 05/16/21: Summary:  RIGHT:  - There is no evidence of deep vein thrombosis in the lower extremity.  However, portions of this examination were limited- see technologist  comments above.  LEFT:  - There is no evidence of deep vein thrombosis in the lower extremity.  However, portions of this examination were limited- see technologist  comments above.      TEE 05/15/21: IMPRESSIONS   1. +PFO, positive bubble study.   2. Left ventricular ejection fraction, by estimation, is 65 to 70%. The  left ventricle has normal function.   3. Right ventricular systolic function is normal. The right ventricular  size is normal.   4. No left atrial/left atrial appendage thrombus was detected.   5. The mitral valve is normal in structure. No evidence of mitral valve  regurgitation. No evidence of mitral stenosis.   6. The aortic valve is tricuspid. Aortic valve regurgitation is mild.  Mild aortic valve sclerosis is present, with no evidence of aortic valve  stenosis.   7. Evidence of atrial level shunting detected by color flow Doppler.  Agitated saline contrast bubble study was positive with shunting observed  within 3-6 cardiac cycles suggestive of interatrial shunt. There is a  small patent foramen ovale with  predominantly right to left shunting across the atrial septum.  - Conclusion(s)/Recommendation(s): No evidence of vegetation/infective  endocarditis on this transesophageal  echocardiogram. +PFO, positive bubble study.      US Carotid 05/14/21: Summary:  Right Carotid: Velocities in the right ICA are consistent with a 1-39%  stenosis.  Left Carotid: Velocities in the left ICA are consistent with  a 1-39%  stenosis.  Vertebrals:  Left vertebral artery demonstrates antegrade flow.  Subclavians: Normal flow hemodynamics were seen in the left subclavian  artery.    TTE  04/20/21: Sonographer Comments: Suboptimal apical window.  IMPRESSIONS   1. Left ventricular ejection fraction, by estimation, is 55 to 60%. The  left ventricle has normal function. The left ventricle has no regional  wall motion abnormalities. There is mild left ventricular hypertrophy.  Left ventricular diastolic parameters  are consistent with Grade I diastolic dysfunction (impaired relaxation).   2. Right ventricular systolic function is normal. The right ventricular  size is normal. Tricuspid regurgitation signal is inadequate for assessing  PA pressure.   3. Left atrial size was mildly dilated.   4. The mitral valve is normal in structure. No evidence of mitral valve  regurgitation. No evidence of mitral stenosis.   5. The aortic valve is normal in structure. Aortic valve regurgitation is  trivial. No aortic stenosis is present.   6. The inferior vena cava is normal in size with greater than 50%  respiratory variability, suggesting right atrial pressure of 3 mmHg.    Past Medical History:  Diagnosis Date   Cancer ([place]) 2017   Right breast   Chronic kidney disease    progression to ESRD 04/10/21   Depression    GERD (gastroesophageal reflux disease)    Glaucoma    Hemiparesis ([facility])    left side   High cholesterol    History of seizure    after a spider bite; 07/15/21   History of stroke with residual deficit    left-side weakness   Hypertension    states BP under control with meds., has been on med. x 2 yr.   Hypothyroidism    Non-insulin dependent type 2 diabetes mellitus (HCC)    Overactive bladder    PFO (patent foramen ovale) 05/15/2021   Stroke ([place])    1998 weakness on left side; 05/12/21    Past Surgical History:  Procedure Laterality Date   ABDOMINAL HYSTERECTOMY     complete   BREAST BIOPSY Left 08/03/2018   Benign adipose tissue   BREAST EXCISIONAL BIOPSY Right 2014   Positive   BREAST LUMPECTOMY Right    BUBBLE STUDY  05/15/2021   Procedure: BUBBLE  STUDY;  Surgeon: Jerline Pain, MD;  Location: [facility];  Service: Cardiovascular;;   CATARACT EXTRACTION W/ INTRAOCULAR LENS IMPLANT Left    CEREBRAL ANEURYSM REPAIR  1998   DIALYSIS/PERMA CATHETER INSERTION N/A 04/10/2021   Procedure: DIALYSIS/PERMA CATHETER INSERTION;  Surgeon: Algernon Huxley, MD;  Location: [facility];  Service: Cardiovascular;  Laterality: N/A;   IR FLUORO GUIDE CV LINE RIGHT  04/30/2021   PICC LINE INSERTION     TEE WITHOUT CARDIOVERSION N/A 05/15/2021   Procedure: TRANSESOPHAGEAL ECHOCARDIOGRAM (TEE);  Surgeon: Jerline Pain, MD;  [facility];  Service: Cardiovascular;  Laterality: N/A;   THYROID LOBECTOMY Right 07/15/2017   Procedure: RIGHT THYROID LOBECTOMY;  Surgeon: Armandina Gemma, MD;  Location: [facility];  Service: General;  Laterality: Right;    MEDICATIONS: No current facility-administered medications for this encounter.    aspirin 81 MG chewable tablet   calcitRIOL (ROCALTROL) 0.5 MCG capsule   dextrose (GLUTOSE) 40 % GEL   lacosamide 100 MG TABS   latanoprost (XALATAN) 0.005 % ophthalmic solution   letrozole (FEMARA) 2.5 MG tablet   levothyroxine (SYNTHROID, LEVOTHROID) 112 MCG tablet   metoprolol succinate (TOPROL-XL) 50 MG 24 hr tablet  Multiple Vitamin (MULTIVITAMIN) capsule   Nutritional Supplements (,FEEDING SUPPLEMENT, PROSOURCE PLUS) liquid   Nutritional Supplements (FEEDING SUPPLEMENT, BOOST BREEZE,) LIQD   multivitamin (RENA-VIT) TABS tablet    Myra Gianotti, PA-C Surgical Short Stay/Anesthesiology [facility] Phone [phone] [facility] Phone [phone] 09/28/2021 12:36 PM

## 2021-09-29 ENCOUNTER — Encounter (HOSPITAL_COMMUNITY): Payer: Self-pay | Admitting: Vascular Surgery

## 2021-09-29 ENCOUNTER — Ambulatory Visit (HOSPITAL_COMMUNITY): Payer: Medicare (Managed Care) | Admitting: Vascular Surgery

## 2021-09-29 ENCOUNTER — Encounter (HOSPITAL_COMMUNITY)
Admission: RE | Disposition: A | Discharge status: Home / Self Care | Payer: Self-pay | Source: Ambulatory Visit | Attending: Vascular Surgery

## 2021-09-29 ENCOUNTER — Ambulatory Visit (HOSPITAL_COMMUNITY)
Admission: RE | Admit: 2021-09-29 | Discharge: 2021-09-29 | Disposition: A | Discharge status: Home / Self Care | Payer: Medicare (Managed Care) | Source: Ambulatory Visit | Attending: Vascular Surgery | Admitting: Vascular Surgery

## 2021-09-29 ENCOUNTER — Other Ambulatory Visit: Payer: Self-pay

## 2021-09-29 DIAGNOSIS — Z809 Family history of malignant neoplasm, unspecified: Secondary | ICD-10-CM | POA: Diagnosis not present

## 2021-09-29 DIAGNOSIS — E1139 Type 2 diabetes mellitus with other diabetic ophthalmic complication: Secondary | ICD-10-CM | POA: Diagnosis not present

## 2021-09-29 DIAGNOSIS — I120 Hypertensive chronic kidney disease with stage 5 chronic kidney disease or end stage renal disease: Secondary | ICD-10-CM | POA: Insufficient documentation

## 2021-09-29 DIAGNOSIS — Z888 Allergy status to other drugs, medicaments and biological substances status: Secondary | ICD-10-CM | POA: Diagnosis not present

## 2021-09-29 DIAGNOSIS — N186 End stage renal disease: Secondary | ICD-10-CM | POA: Insufficient documentation

## 2021-09-29 DIAGNOSIS — Z7982 Long term (current) use of aspirin: Secondary | ICD-10-CM | POA: Diagnosis not present

## 2021-09-29 DIAGNOSIS — Z79899 Other long term (current) drug therapy: Secondary | ICD-10-CM | POA: Diagnosis not present

## 2021-09-29 DIAGNOSIS — Z992 Dependence on renal dialysis: Secondary | ICD-10-CM | POA: Insufficient documentation

## 2021-09-29 DIAGNOSIS — Z79890 Hormone replacement therapy (postmenopausal): Secondary | ICD-10-CM | POA: Diagnosis not present

## 2021-09-29 DIAGNOSIS — H42 Glaucoma in diseases classified elsewhere: Secondary | ICD-10-CM | POA: Diagnosis not present

## 2021-09-29 DIAGNOSIS — I351 Nonrheumatic aortic (valve) insufficiency: Secondary | ICD-10-CM | POA: Diagnosis not present

## 2021-09-29 DIAGNOSIS — Z91013 Allergy to seafood: Secondary | ICD-10-CM | POA: Insufficient documentation

## 2021-09-29 DIAGNOSIS — I69354 Hemiplegia and hemiparesis following cerebral infarction affecting left non-dominant side: Secondary | ICD-10-CM | POA: Diagnosis not present

## 2021-09-29 DIAGNOSIS — Z7984 Long term (current) use of oral hypoglycemic drugs: Secondary | ICD-10-CM | POA: Diagnosis not present

## 2021-09-29 DIAGNOSIS — Z91040 Latex allergy status: Secondary | ICD-10-CM | POA: Diagnosis not present

## 2021-09-29 DIAGNOSIS — Z853 Personal history of malignant neoplasm of breast: Secondary | ICD-10-CM | POA: Insufficient documentation

## 2021-09-29 DIAGNOSIS — Z91041 Radiographic dye allergy status: Secondary | ICD-10-CM | POA: Insufficient documentation

## 2021-09-29 DIAGNOSIS — E1122 Type 2 diabetes mellitus with diabetic chronic kidney disease: Secondary | ICD-10-CM | POA: Insufficient documentation

## 2021-09-29 HISTORY — PX: AV FISTULA PLACEMENT: SHX1204

## 2021-09-29 LAB — POCT I-STAT, CHEM 8
BUN: 31 mg/dL — ABNORMAL HIGH (ref 8–23)
Calcium, Ion: 1.03 mmol/L — ABNORMAL LOW (ref 1.15–1.40)
Chloride: 101 mmol/L (ref 98–111)
Creatinine, Ser: 6.2 mg/dL — ABNORMAL HIGH (ref 0.44–1.00)
Glucose, Bld: 77 mg/dL (ref 70–99)
HCT: 39 % (ref 36.0–46.0)
Hemoglobin: 13.3 g/dL (ref 12.0–15.0)
Potassium: 4.7 mmol/L (ref 3.5–5.1)
Sodium: 138 mmol/L (ref 135–145)
TCO2: 24 mmol/L (ref 22–32)

## 2021-09-29 LAB — GLUCOSE, CAPILLARY
Glucose-Capillary: 62 mg/dL — ABNORMAL LOW (ref 70–99)
Glucose-Capillary: 83 mg/dL (ref 70–99)
Glucose-Capillary: 75 mg/dL (ref 70–99)
Glucose-Capillary: 105 mg/dL — ABNORMAL HIGH (ref 70–99)
Glucose-Capillary: 86 mg/dL (ref 70–99)

## 2021-09-29 SURGERY — INSERTION OF ARTERIOVENOUS (AV) GORE-TEX GRAFT ARM
Anesthesia: Monitor Anesthesia Care | Site: Arm Upper | Laterality: Right

## 2021-09-29 MED ORDER — CHLORHEXIDINE GLUCONATE 0.12 % MT SOLN
OROMUCOSAL | Status: AC
  Administered 2021-09-29: 15 mL via OROMUCOSAL
  Filled 2021-09-29: qty 15

## 2021-09-29 MED ORDER — LIDOCAINE-EPINEPHRINE (PF) 1 %-1:200000 IJ SOLN
INTRAMUSCULAR | Status: DC | PRN
  Administered 2021-09-29: 30 mL

## 2021-09-29 MED ORDER — FENTANYL CITRATE (PF) 250 MCG/5ML IJ SOLN
INTRAMUSCULAR | Status: AC
  Filled 2021-09-29: qty 5

## 2021-09-29 MED ORDER — PROTAMINE SULFATE 10 MG/ML IV SOLN
INTRAVENOUS | Status: DC | PRN
  Administered 2021-09-29: 30 mg via INTRAVENOUS

## 2021-09-29 MED ORDER — HEPARIN SODIUM (PORCINE) 1000 UNIT/ML IJ SOLN
INTRAMUSCULAR | Status: DC | PRN
  Administered 2021-09-29: 5000 [IU] via INTRAVENOUS

## 2021-09-29 MED ORDER — CHLORHEXIDINE GLUCONATE 4 % EX LIQD: 60.0000 mL | Freq: Once | CUTANEOUS | Status: DC

## 2021-09-29 MED ORDER — METOPROLOL SUCCINATE ER 25 MG PO TB24
50.0000 mg | ORAL_TABLET | Freq: Every day | ORAL | Status: DC
  Administered 2021-09-29: 50 mg via ORAL
  Filled 2021-09-29: qty 2

## 2021-09-29 MED ORDER — LIDOCAINE 2% (20 MG/ML) 5 ML SYRINGE
INTRAMUSCULAR | Status: AC
  Filled 2021-09-29: qty 5

## 2021-09-29 MED ORDER — PHENYLEPHRINE HCL-NACL 20-0.9 MG/250ML-% IV SOLN
INTRAVENOUS | Status: AC
  Filled 2021-09-29: qty 250

## 2021-09-29 MED ORDER — CEFAZOLIN SODIUM-DEXTROSE 2-4 GM/100ML-% IV SOLN
2.0000 g | INTRAVENOUS | Status: AC
  Administered 2021-09-29: 2 g via INTRAVENOUS
  Filled 2021-09-29: qty 100

## 2021-09-29 MED ORDER — CHLORHEXIDINE GLUCONATE 0.12 % MT SOLN: 15.0000 mL | Freq: Once | OROMUCOSAL | Status: AC

## 2021-09-29 MED ORDER — HEPARIN 6000 UNIT IRRIGATION SOLUTION
Status: AC
  Filled 2021-09-29: qty 500

## 2021-09-29 MED ORDER — PROPOFOL 500 MG/50ML IV EMUL
INTRAVENOUS | Status: DC | PRN
  Administered 2021-09-29: 40 ug/kg/min via INTRAVENOUS

## 2021-09-29 MED ORDER — HEPARIN 6000 UNIT IRRIGATION SOLUTION
Status: DC | PRN
  Administered 2021-09-29: 1

## 2021-09-29 MED ORDER — ONDANSETRON HCL 4 MG/2ML IJ SOLN
INTRAMUSCULAR | Status: DC | PRN
  Administered 2021-09-29: 4 mg via INTRAVENOUS

## 2021-09-29 MED ORDER — ORAL CARE MOUTH RINSE: 15.0000 mL | Freq: Once | OROMUCOSAL | Status: AC

## 2021-09-29 MED ORDER — DEXTROSE 50 % IV SOLN: 12.5000 g | INTRAVENOUS | Status: AC

## 2021-09-29 MED ORDER — PHENYLEPHRINE HCL-NACL 20-0.9 MG/250ML-% IV SOLN
INTRAVENOUS | Status: DC | PRN
  Administered 2021-09-29: 50 ug/min via INTRAVENOUS

## 2021-09-29 MED ORDER — FENTANYL CITRATE (PF) 100 MCG/2ML IJ SOLN
INTRAMUSCULAR | Status: DC | PRN
  Administered 2021-09-29: 25 ug via INTRAVENOUS

## 2021-09-29 MED ORDER — SODIUM CHLORIDE 0.9 % IV SOLN: INTRAVENOUS | Status: DC

## 2021-09-29 MED ORDER — PROPOFOL 10 MG/ML IV BOLUS
INTRAVENOUS | Status: AC
  Filled 2021-09-29: qty 20

## 2021-09-29 MED ORDER — PROPOFOL 500 MG/50ML IV EMUL
INTRAVENOUS | Status: AC
  Filled 2021-09-29: qty 50

## 2021-09-29 MED ORDER — DEXTROSE 50 % IV SOLN
INTRAVENOUS | Status: AC
  Administered 2021-09-29: 12.5 g via INTRAVENOUS
  Filled 2021-09-29: qty 50

## 2021-09-29 MED ORDER — LIDOCAINE HCL (PF) 1 % IJ SOLN
INTRAMUSCULAR | Status: DC | PRN
  Administered 2021-09-29: 14 mL

## 2021-09-29 MED ORDER — OXYCODONE-ACETAMINOPHEN 5-325 MG PO TABS: 1.0000 | ORAL_TABLET | Freq: Four times a day (QID) | ORAL | 0 refills | Status: DC | PRN

## 2021-09-29 MED ORDER — HEPARIN SODIUM (PORCINE) 1000 UNIT/ML IJ SOLN: 1000.0000 [IU] | Freq: Once | INTRAMUSCULAR | Status: DC

## 2021-09-29 MED ORDER — LIDOCAINE-EPINEPHRINE (PF) 1 %-1:200000 IJ SOLN
INTRAMUSCULAR | Status: AC
  Filled 2021-09-29: qty 30

## 2021-09-29 MED ORDER — 0.9 % SODIUM CHLORIDE (POUR BTL) OPTIME
TOPICAL | Status: DC | PRN
  Administered 2021-09-29: 1000 mL

## 2021-09-29 MED ORDER — PROPOFOL 1000 MG/100ML IV EMUL
INTRAVENOUS | Status: AC
  Filled 2021-09-29: qty 100

## 2021-09-29 MED ORDER — DEXTROSE 50 % IV SOLN
INTRAVENOUS | Status: DC | PRN
  Administered 2021-09-29: .5 via INTRAVENOUS

## 2021-09-29 MED ORDER — LIDOCAINE HCL (PF) 1 % IJ SOLN
INTRAMUSCULAR | Status: AC
  Filled 2021-09-29: qty 30

## 2021-09-29 MED ORDER — ONDANSETRON HCL 4 MG/2ML IJ SOLN
INTRAMUSCULAR | Status: AC
  Filled 2021-09-29: qty 2

## 2021-09-29 SURGICAL SUPPLY — 37 items
ADH SKN CLS APL DERMABOND .7 (GAUZE/BANDAGES/DRESSINGS) ×1
ARMBAND PINK RESTRICT EXTREMIT (MISCELLANEOUS) ×4 IMPLANT
BAG COUNTER SPONGE SURGICOUNT (BAG) ×2 IMPLANT
BAG SPNG CNTER NS LX DISP (BAG) ×1
CANISTER SUCT 3000ML PPV (MISCELLANEOUS) ×2 IMPLANT
CANNULA VESSEL 3MM 2 BLNT TIP (CANNULA) ×2 IMPLANT
CLIP VESOCCLUDE MED 6/CT (CLIP) ×2 IMPLANT
CLIP VESOCCLUDE SM WIDE 6/CT (CLIP) ×2 IMPLANT
DECANTER SPIKE VIAL GLASS SM (MISCELLANEOUS) ×2 IMPLANT
DERMABOND ADVANCED (GAUZE/BANDAGES/DRESSINGS) ×1
DERMABOND ADVANCED .7 DNX12 (GAUZE/BANDAGES/DRESSINGS) ×1 IMPLANT
ELECT REM PT RETURN 9FT ADLT (ELECTROSURGICAL) ×2
ELECTRODE REM PT RTRN 9FT ADLT (ELECTROSURGICAL) ×1 IMPLANT
GAUZE 4X4 16PLY ~~LOC~~+RFID DBL (SPONGE) ×1 IMPLANT
GLOVE SRG 8 PF TXTR STRL LF DI (GLOVE) ×1 IMPLANT
GLOVE SURG ENC MOIS LTX SZ7.5 (GLOVE) ×2 IMPLANT
GLOVE SURG POLY ORTHO LF SZ7.5 (GLOVE) ×1 IMPLANT
GLOVE SURG UNDER LTX SZ8 (GLOVE) ×2 IMPLANT
GLOVE SURG UNDER POLY LF SZ8 (GLOVE) ×2
GOWN STRL REUS W/ TWL LRG LVL3 (GOWN DISPOSABLE) ×3 IMPLANT
GOWN STRL REUS W/TWL LRG LVL3 (GOWN DISPOSABLE) ×6
GRAFT GORETEX STRT 4-7X45 (Vascular Products) ×1 IMPLANT
KIT BASIN OR (CUSTOM PROCEDURE TRAY) ×2 IMPLANT
KIT TURNOVER KIT B (KITS) ×2 IMPLANT
NS IRRIG 1000ML POUR BTL (IV SOLUTION) ×2 IMPLANT
PACK CV ACCESS (CUSTOM PROCEDURE TRAY) ×2 IMPLANT
PAD ARMBOARD 7.5X6 YLW CONV (MISCELLANEOUS) ×4 IMPLANT
SPONGE SURGIFOAM ABS GEL 100 (HEMOSTASIS) IMPLANT
SPONGE T-LAP 18X18 ~~LOC~~+RFID (SPONGE) ×1 IMPLANT
SUT MNCRL AB 4-0 PS2 18 (SUTURE) ×4 IMPLANT
SUT PROLENE 6 0 BV (SUTURE) ×6 IMPLANT
SUT VIC AB 3-0 SH 27 (SUTURE) ×4
SUT VIC AB 3-0 SH 27X BRD (SUTURE) ×2 IMPLANT
SYR TOOMEY 50ML (SYRINGE) IMPLANT
TOWEL GREEN STERILE (TOWEL DISPOSABLE) ×2 IMPLANT
UNDERPAD 30X36 HEAVY ABSORB (UNDERPADS AND DIAPERS) ×2 IMPLANT
WATER STERILE IRR 1000ML POUR (IV SOLUTION) ×2 IMPLANT

## 2021-09-29 NOTE — Interval H&P Note (Signed)
History and Physical Interval Note:  09/29/2021 8:41 AM  Kirsten Mcmahon  has presented today for surgery, with the diagnosis of ESRD.  The various methods of treatment have been discussed with the patient and family. After consideration of risks, benefits and other options for treatment, the patient has consented to  Procedure(s): INSERTION OF RIGHT ARM ARTERIOVENOUS (AV) GORE-TEX GRAFT (Right) as a surgical intervention.  The patient's history has been reviewed, patient examined, no change in status, stable for surgery.  I have reviewed the patient's chart and labs.  Questions were answered to the patient's satisfaction.     [name]

## 2021-09-29 NOTE — Progress Notes (Signed)
Patient complaining of right hand numbness after R sided graft surgery, thrill and bruit present in arm, radial pulse 2+, hand warm. Cori, [place] paged. MD Scot Dock in room, MD says that numbness will go away, instruct pt to do hand exercises, patient ok to discharge. Patient and patient's son educated.

## 2021-09-29 NOTE — Transfer of Care (Signed)
Immediate Anesthesia Transfer of Care Note  Patient: Kirsten Mcmahon  Procedure(s) Performed: INSERTION OF RIGHT ARM ARTERIOVENOUS (AV) GORE-TEX GRAFT (Right: Arm Upper)  Patient Location: PACU  Anesthesia Type:MAC  Level of Consciousness: drowsy  Airway & Oxygen Therapy: Patient Spontanous Breathing  Post-op Assessment: Report given to RN and Post -op Vital signs reviewed and stable  Post vital signs: Reviewed and stable  Last Vitals:  Vitals Value Taken Time  BP 106/61 09/29/21 1107  Temp    Pulse 76 09/29/21 1108  Resp 16 09/29/21 1108  SpO2 99 % 09/29/21 1108  Vitals shown include unvalidated device data.  Last Pain:  Vitals:   09/29/21 0817  TempSrc:   PainSc: 0-No pain         Complications: No notable events documented.

## 2021-09-29 NOTE — Progress Notes (Addendum)
Dr. Ermalene Postin stated it was okay to access dialysis catheter for IV access. 0750 blood sugar 62. Pt. Asymptomatic. Per hypoglycemia protocol 12.5 gm dextrose given IV. 0810 CBG 83.

## 2021-09-29 NOTE — Discharge Instructions (Signed)
[facility]  Discharge Instructions  AV Fistula or Graft Surgery for Dialysis Access  Please refer to the following instructions for your post-procedure care. Your surgeon or physician assistant will discuss any changes with you.  Activity  You may drive the day following your surgery, if you are comfortable and no longer taking prescription pain medication. Resume full activity as the soreness in your incision resolves.  Bathing/Showering  You may shower after you go home. Keep your incision dry for 48 hours. Do not soak in a bathtub, hot tub, or swim until the incision heals completely. You may not shower if you have a hemodialysis catheter.  Incision Care  Clean your incision with mild soap and water after 48 hours. Pat the area dry with a clean towel. You do not need a bandage unless otherwise instructed. Do not apply any ointments or creams to your incision. You may have skin glue on your incision. Do not peel it off. It will come off on its own in about one week. Your arm may swell a bit after surgery. To reduce swelling use pillows to elevate your arm so it is above your heart. Your doctor will tell you if you need to lightly wrap your arm with an ACE bandage.  Diet  Resume your normal diet. There are not special food restrictions following this procedure. In order to heal from your surgery, it is CRITICAL to get adequate nutrition. Your body requires vitamins, minerals, and protein. Vegetables are the best source of vitamins and minerals. Vegetables also provide the perfect balance of protein. Processed food has little nutritional value, so try to avoid this.  Medications  Resume taking all of your medications. If your incision is causing pain, you may take over-the counter pain relievers such as acetaminophen (Tylenol). If you were prescribed a stronger pain medication, please be aware these medications can cause nausea and constipation. Prevent  nausea by taking the medication with a snack or meal. Avoid constipation by drinking plenty of fluids and eating foods with high amount of fiber, such as fruits, vegetables, and grains.  Do not take Tylenol if you are taking prescription pain medications.  Follow up Your surgeon may want to see you in the office following your access surgery. If so, this will be arranged at the time of your surgery.  Please call us immediately for any of the following conditions:  Increased pain, redness, drainage (pus) from your incision site Fever of 101 degrees or higher Severe or worsening pain at your incision site Hand pain or numbness.  Reduce your risk of vascular disease:  Stop smoking. If you would like help, call [facility] at 1-800-QUIT-NOW (585)285-1643) or [place] at [place] your cholesterol Maintain a desired weight Control your diabetes Keep your blood pressure down  Dialysis  It will take several weeks to several months for your new dialysis access to be ready for use. Your surgeon will determine when it is okay to use it. Your nephrologist will continue to direct your dialysis. You can continue to use your Permcath until your new access is ready for use.   09/29/2021 KARSEN FELLOWS [id number] 05-06-1946  Surgeon(s): Angelia Mould, MD  Procedure(s): INSERTION OF RIGHT ARM ARTERIOVENOUS (AV) GORE-TEX GRAFT   May stick graft immediately   May stick graft on designated area only:   X Do not stick right AV Graft for 4 weeks    If you have any questions, please call  the office at [phone].

## 2021-09-29 NOTE — Op Note (Signed)
    NAME: Kirsten Mcmahon    MRN: [id number] DOB: Mar 29, 1946    DATE OF OPERATION: 09/29/2021  PREOP DIAGNOSIS:    End-stage renal disease  POSTOP DIAGNOSIS:    Same  PROCEDURE:    New right upper arm AV graft (4-7 mm PTFE graft)  SURGEON: Judeth Cornfield. Scot Dock, MD  ASSIST: Karoline Caldwell, PA  ANESTHESIA: Local with sedation  EBL: Minimal  INDICATIONS:    [name] is a 75 y.o. female who presents for new access.  She has a functioning catheter.  FINDINGS:   3 mm brachial artery.  4 mm upper arm brachial vein  TECHNIQUE:   The patient was taken to the operating room.  I looked at all the veins with the SonoSite and there was no options for a fistula.  The right arm was prepped and draped in usual sterile fashion.  After the skin was anesthetized with 1% lidocaine the transverse incision was made just above the antecubital level.  Here the brachial artery was dissected free.  He was fairly small about 3 mm.  The adjacent veins were tiny.  I therefore elected to place an upper arm graft.  A separate longitudinal incision was made beneath the axilla after the skin was anesthetized.  The high brachial vein was dissected free.  I had to go fairly high to get a decent size vein.  A 4-7 mm PTFE graft was tunneled between the 2 incisions and the patient was heparinized.  The brachial artery was clamped proximally and distally and a longitudinal arteriotomy was made.  I excised just a short segment of the 4 mm end of the graft and it was spatulated and sewn end-to-side to the brachial artery using continuous 6-0 Prolene suture.  The graft and poor the appropriate length for anastomosis to the high brachial vein.  The vein was ligated distally and spatulated proximally.  The graft cut the appropriate length spatulated and sewn into into the vein using continuous 6-0 Prolene suture.  At the completion there was a good thrill in the graft.  There was a radial and ulnar signal with a  Doppler which augmented with compression of the graft.  The heparin was partially reversed with protamine.  The wounds were each closed with a deep layer of 3-0 Vicryl and the skin closed with 4-0 Monocryl.  Dermabond was applied.  The patient tolerated the procedure well was transferred to the recovery room in stable condition.  All needle and sponge counts were correct.  Given the complexity of the case a first assistant was necessary in order to expedient the procedure and safely perform the technical aspects of the operation.  [name], MD, FACS [facility]  DATE OF DICTATION:   09/29/2021

## 2021-09-30 ENCOUNTER — Encounter (HOSPITAL_COMMUNITY): Payer: Self-pay | Admitting: Vascular Surgery

## 2021-09-30 NOTE — Anesthesia Postprocedure Evaluation (Signed)
Anesthesia Post Note  Patient: Kirsten Mcmahon  Procedure(s) Performed: INSERTION OF RIGHT ARM ARTERIOVENOUS (AV) GORE-TEX GRAFT (Right: Arm Upper)     Patient location during evaluation: PACU Anesthesia Type: MAC Level of consciousness: awake and alert Pain management: pain level controlled Vital Signs Assessment: post-procedure vital signs reviewed and stable Respiratory status: spontaneous breathing, nonlabored ventilation, respiratory function stable and patient connected to nasal cannula oxygen Cardiovascular status: stable and blood pressure returned to baseline Postop Assessment: no apparent nausea or vomiting Anesthetic complications: no   No notable events documented.  Last Vitals:  Vitals:   09/29/21 1207 09/29/21 1222  BP: 113/72 109/72  Pulse: 81 81  Resp: 17 18  Temp:  (!) 36.4 C  SpO2: 100% 100%    Last Pain:  Vitals:   09/29/21 1207  TempSrc:   PainSc: 0-No pain                 Wm Fruchter

## 2021-10-15 ENCOUNTER — Ambulatory Visit (INDEPENDENT_AMBULATORY_CARE_PROVIDER_SITE_OTHER): Payer: Medicare (Managed Care) | Admitting: Physician Assistant

## 2021-10-15 ENCOUNTER — Other Ambulatory Visit: Payer: Self-pay

## 2021-10-15 VITALS — BP 104/64 | HR 100 | Temp 97.7°F | Resp 20 | Ht 62.0 in

## 2021-10-15 DIAGNOSIS — N186 End stage renal disease: Secondary | ICD-10-CM

## 2021-10-15 DIAGNOSIS — Z992 Dependence on renal dialysis: Secondary | ICD-10-CM

## 2021-10-15 NOTE — Progress Notes (Signed)
  POST OPERATIVE OFFICE NOTE    CC:  F/u for surgery  HPI:  This is a 75 y.o. female who is s/p right upper arm AV graft  on 09/29/21 by Dr. Scot Dock.   She has HD on MWF via [facility].    Pt returns today for follow up.  Pt states she is doing well without issues of steal.  She denise pain, loss of sensation or loss of motor.     Allergies  Allergen Reactions   Contrast Media [Iodinated Diagnostic Agents]     Other reaction(s): NO ALLERGY   Latex     Other reaction(s): NO ALLERGY   Shellfish-Derived Products     Other reaction(s): NO ALLERGY   Levemir [Insulin Detemir] Itching    Current Outpatient Medications  Medication Sig Dispense Refill   aspirin 81 MG chewable tablet Chew 1 tablet (81 mg total) by mouth daily. 60 tablet 0   calcitRIOL (ROCALTROL) 0.5 MCG capsule Take 1 capsule (0.5 mcg total) by mouth every Monday, Wednesday, and Friday with hemodialysis. 30 capsule 0   dextrose (GLUTOSE) 40 % GEL Place 28 mLs inside cheek as needed (for suspected low sugars). 37.5 g 2   lacosamide 100 MG TABS Take 1 tablet (100 mg total) by mouth 2 (two) times daily. 60 tablet 0   latanoprost (XALATAN) 0.005 % ophthalmic solution Place 1 drop into both eyes at bedtime. 2.5 mL 0   letrozole (FEMARA) 2.5 MG tablet Take 1 tablet (2.5 mg total) by mouth daily. 90 tablet 3   levothyroxine (SYNTHROID, LEVOTHROID) 112 MCG tablet Take 1 tablet (112 mcg total) by mouth daily before breakfast. 90 tablet 0   metoprolol succinate (TOPROL-XL) 50 MG 24 hr tablet Take 50 mg by mouth daily.     Multiple Vitamin (MULTIVITAMIN) capsule Take 1 capsule by mouth daily. [employer] Senior     multivitamin (RENA-VIT) TABS tablet Take 1 tablet by mouth at bedtime. 30 tablet 0   Nutritional Supplements (,FEEDING SUPPLEMENT, PROSOURCE PLUS) liquid Take 30 mLs by mouth 3 (three) times daily between meals. 1000 mL 0   Nutritional Supplements (FEEDING SUPPLEMENT, BOOST BREEZE,) LIQD Take 1 each by mouth 3 (three) times daily.  10000 mL 0   oxyCODONE-acetaminophen (PERCOCET) 5-325 MG tablet Take 1 tablet by mouth every 6 (six) hours as needed for severe pain. 20 tablet 0   No current facility-administered medications for this visit.     ROS:  See HPI  Physical Exam:    Incision:  Well healed incision, no abnormal edema, no erythema Extremities:  Right UE palpable radial pulse, with brisk doppler signals in the ulnar artery and good palmer signal.   Grip is 5/5 right UE.     Assessment/Plan:  This is a 75 y.o. female who is s/p:   Right AV graft placement The AV graft may be accessed as of 10/31/21.  She will f/u PRN.     Roxy Horseman PA-C [facility] [phone]   Clinic MD:  Scot Dock

## 2021-11-17 ENCOUNTER — Encounter: Payer: Self-pay | Admitting: Vascular Surgery

## 2021-11-17 ENCOUNTER — Other Ambulatory Visit: Payer: Self-pay

## 2021-11-17 ENCOUNTER — Ambulatory Visit (INDEPENDENT_AMBULATORY_CARE_PROVIDER_SITE_OTHER): Payer: Self-pay | Admitting: Vascular Surgery

## 2021-11-17 VITALS — BP 133/78 | Temp 98.3°F | Resp 20 | Ht 62.0 in | Wt 120.0 lb

## 2021-11-17 DIAGNOSIS — N186 End stage renal disease: Secondary | ICD-10-CM

## 2021-11-17 DIAGNOSIS — Z992 Dependence on renal dialysis: Secondary | ICD-10-CM

## 2021-11-17 NOTE — Progress Notes (Signed)
[facility] OF [place] PROGRESS NOTE  ASSESSMENT / PLAN: Kirsten Mcmahon is a 75 y.o. female with ESRD dialyzing via right arm arteriovenous graft. She has a tunneled dialysis catheter which is ready to be removed. This was placed at [facility]. She now lives in [place] and has limited transportation options. Will plan to remove the [facility] in [place] hospital as her dialysis schedule allows.  SUBJECTIVE: Patient presents to clinic for tunneled dialysis catheter removal.  Her catheter was placed in [facility].  She has relocated to [facility] and is unable to travel because of mobility problems after a stroke.  OBJECTIVE: BP 133/78 (BP Location: Left Arm, Patient Position: Sitting, Cuff Size: Normal)    Temp 98.3 F (36.8 C)    Resp 20    Ht 5\' 2"  (1.575 m)    Wt 120 lb (54.4 kg)    SpO2 98%    BMI 21.95 kg/m   No acute distress Regular rate and rhythm Unlabored breathing Right chest tunneled dialysis catheter in place  CBC Latest Ref Rng & Units 09/29/2021 07/30/2021 07/29/2021  WBC 4.0 - 10.5 K/uL - 6.9 5.5  Hemoglobin 12.0 - 15.0 g/dL 13.3 9.8(L) 9.5(L)  Hematocrit 36.0 - 46.0 % 39.0 29.7(L) 29.3(L)  Platelets 150 - 400 K/uL - 203 212     CMP Latest Ref Rng & Units 09/29/2021 07/30/2021 07/29/2021  Glucose 70 - 99 mg/dL 77 88 172(H)  BUN 8 - 23 mg/dL 31(H) 33(H) 31(H)  Creatinine 0.44 - 1.00 mg/dL 6.20(H) 5.67(H) 5.66(H)  Sodium 135 - 145 mmol/L 138 125(L) 125(L)  Potassium 3.5 - 5.1 mmol/L 4.7 3.7 3.3(L)  Chloride 98 - 111 mmol/L 101 92(L) 93(L)  CO2 22 - 32 mmol/L - 24 25  Calcium 8.9 - 10.3 mg/dL - 8.8(L) 8.3(L)  Total Protein 6.5 - 8.1 g/dL - - -  Total Bilirubin 0.3 - 1.2 mg/dL - - -  Alkaline Phos 38 - 126 U/L - - -  AST 15 - 41 U/L - - -  ALT 0 - 44 U/L - - -     Mylik Pro N. Stanford Breed, MD [facility] Phone Number: [phone] 11/17/2021 1:56 PM

## 2021-11-24 ENCOUNTER — Encounter (HOSPITAL_COMMUNITY): Payer: Medicare (Managed Care)

## 2021-11-25 ENCOUNTER — Other Ambulatory Visit: Payer: Self-pay

## 2021-11-25 ENCOUNTER — Telehealth: Payer: Self-pay

## 2021-11-25 NOTE — Telephone Encounter (Signed)
Received a call from [place] with [facility] advising patient had missed her TDC removal on yesterday and requested to reschedule. Patient scheduled at next available date on Jan 10 at [facility] arriving at 76 AM. She voiced understanding.   Informed patient's son [name] of appointment information. Voiced understanding.

## 2021-12-15 ENCOUNTER — Other Ambulatory Visit: Payer: Self-pay

## 2021-12-15 ENCOUNTER — Ambulatory Visit (HOSPITAL_COMMUNITY)
Admission: RE | Admit: 2021-12-15 | Discharge: 2021-12-15 | Disposition: A | Discharge status: Home / Self Care | Payer: Medicare (Managed Care) | Source: Ambulatory Visit | Attending: Surgery | Admitting: Surgery

## 2021-12-15 DIAGNOSIS — N186 End stage renal disease: Secondary | ICD-10-CM | POA: Insufficient documentation

## 2021-12-15 MED ORDER — LIDOCAINE HCL (PF) 2 % IJ SOLN
INTRAMUSCULAR | Status: AC
  Filled 2021-12-15: qty 10

## 2021-12-15 NOTE — Progress Notes (Signed)
°  Catheter Removal Procedure Note   PRE-OPERATIVE DIAGNOSIS:   ESRD.   POST-OPERATIVE DIAGNOSIS:  ESRD.  PROCEDURE:  Removal of right IJ perm cath.   PROVIDER:   Karoline Caldwell PA-C.  ANESTHESIA:  Local with conscious sedation.  The risks and benefits of this procedure were explained to the patient and the patient consented.   OPERATIVE PROCEDURE:  The following procedure was performed at the bedside.  The right side of patients neck and chest was prepped and draped in standard fashion.  Local anesthesia was infiltrated over the tunneled catheter and its cuff.  The cuff was loosened using a combination of blunt and sharp dissection. The catheter was removed in its entirety, and hemostasis was achieved with local compression. Dressings applied.   The patient tolerated the procedure well and did not have any complications.  RECOMMENDATIONS:  Instructions given to the patient regarding wound care and bleeding. Follow up as needed    Karoline Caldwell, PA-C [facility] [phone] 12/15/2021  1:29 PM

## 2021-12-15 NOTE — Progress Notes (Signed)
Dressing to right upper chest CDI upon DC.  [facility] called for transport, and patient given written DC instructions.

## 2021-12-15 NOTE — Discharge Instructions (Signed)
No laying flat for 4 hours. You may shower and remove the bandage on your chest in 24 hours, after 2:30 in the afternoon on 12/16/21. Place bandaid to the site after shower.

## 2022-01-11 ENCOUNTER — Emergency Department (HOSPITAL_COMMUNITY): Payer: Medicare (Managed Care)

## 2022-01-11 ENCOUNTER — Emergency Department (HOSPITAL_COMMUNITY)
Admission: EM | Admit: 2022-01-11 | Discharge: 2022-01-11 | Disposition: A | Discharge status: Home / Self Care | Payer: Medicare (Managed Care) | Attending: Emergency Medicine | Admitting: Emergency Medicine

## 2022-01-11 DIAGNOSIS — Z20822 Contact with and (suspected) exposure to covid-19: Secondary | ICD-10-CM | POA: Diagnosis not present

## 2022-01-11 DIAGNOSIS — N185 Chronic kidney disease, stage 5: Secondary | ICD-10-CM | POA: Insufficient documentation

## 2022-01-11 DIAGNOSIS — Z91040 Latex allergy status: Secondary | ICD-10-CM | POA: Insufficient documentation

## 2022-01-11 DIAGNOSIS — Z7982 Long term (current) use of aspirin: Secondary | ICD-10-CM | POA: Insufficient documentation

## 2022-01-11 DIAGNOSIS — Z992 Dependence on renal dialysis: Secondary | ICD-10-CM | POA: Insufficient documentation

## 2022-01-11 DIAGNOSIS — G8191 Hemiplegia, unspecified affecting right dominant side: Secondary | ICD-10-CM | POA: Insufficient documentation

## 2022-01-11 DIAGNOSIS — R519 Headache, unspecified: Secondary | ICD-10-CM | POA: Diagnosis present

## 2022-01-11 DIAGNOSIS — R4182 Altered mental status, unspecified: Secondary | ICD-10-CM | POA: Insufficient documentation

## 2022-01-11 DIAGNOSIS — N186 End stage renal disease: Secondary | ICD-10-CM

## 2022-01-11 DIAGNOSIS — R55 Syncope and collapse: Secondary | ICD-10-CM | POA: Insufficient documentation

## 2022-01-11 DIAGNOSIS — E1122 Type 2 diabetes mellitus with diabetic chronic kidney disease: Secondary | ICD-10-CM | POA: Insufficient documentation

## 2022-01-11 LAB — COMPREHENSIVE METABOLIC PANEL
ALT: 15 U/L (ref 0–44)
AST: 23 U/L (ref 15–41)
Albumin: 3.6 g/dL (ref 3.5–5.0)
Alkaline Phosphatase: 65 U/L (ref 38–126)
Anion gap: 13 (ref 5–15)
BUN: 16 mg/dL (ref 8–23)
CO2: 30 mmol/L (ref 22–32)
Calcium: 9 mg/dL (ref 8.9–10.3)
Chloride: 99 mmol/L (ref 98–111)
Creatinine, Ser: 4.31 mg/dL — ABNORMAL HIGH (ref 0.44–1.00)
GFR, Estimated: 10 mL/min — ABNORMAL LOW (ref >60)
Glucose, Bld: 117 mg/dL — ABNORMAL HIGH (ref 70–99)
Potassium: 4.6 mmol/L (ref 3.5–5.1)
Sodium: 142 mmol/L (ref 135–145)
Total Bilirubin: 0.9 mg/dL (ref 0.3–1.2)
Total Protein: 7.8 g/dL (ref 6.5–8.1)

## 2022-01-11 LAB — DIFFERENTIAL
Abs Immature Granulocytes: 0.02 10*3/uL (ref 0.00–0.07)
Basophils Absolute: 0 10*3/uL (ref 0.0–0.1)
Basophils Relative: 0 %
Eosinophils Absolute: 0.1 10*3/uL (ref 0.0–0.5)
Eosinophils Relative: 1 %
Immature Granulocytes: 0 %
Lymphocytes Relative: 14 %
Lymphs Abs: 1 10*3/uL (ref 0.7–4.0)
Monocytes Absolute: 0.5 10*3/uL (ref 0.1–1.0)
Monocytes Relative: 7 %
Neutro Abs: 5.6 10*3/uL (ref 1.7–7.7)
Neutrophils Relative %: 78 %

## 2022-01-11 LAB — APTT: aPTT: 24 seconds (ref 24–36)

## 2022-01-11 LAB — I-STAT CHEM 8, ED
BUN: 29 mg/dL — ABNORMAL HIGH (ref 8–23)
Calcium, Ion: 0.98 mmol/L — ABNORMAL LOW (ref 1.15–1.40)
Chloride: 98 mmol/L (ref 98–111)
Creatinine, Ser: 5.5 mg/dL — ABNORMAL HIGH (ref 0.44–1.00)
Glucose, Bld: 86 mg/dL (ref 70–99)
HCT: 35 % — ABNORMAL LOW (ref 36.0–46.0)
Hemoglobin: 11.9 g/dL — ABNORMAL LOW (ref 12.0–15.0)
Potassium: 4.3 mmol/L (ref 3.5–5.1)
Sodium: 139 mmol/L (ref 135–145)
TCO2: 35 mmol/L — ABNORMAL HIGH (ref 22–32)

## 2022-01-11 LAB — ETHANOL: Alcohol, Ethyl (B): <10 mg/dL (ref <10)

## 2022-01-11 LAB — CBC
HCT: 34.2 % — ABNORMAL LOW (ref 36.0–46.0)
Hemoglobin: 10.8 g/dL — ABNORMAL LOW (ref 12.0–15.0)
MCH: 33.2 pg (ref 26.0–34.0)
MCHC: 31.6 g/dL (ref 30.0–36.0)
MCV: 105.2 fL — ABNORMAL HIGH (ref 80.0–100.0)
Platelets: 153 10*3/uL (ref 150–400)
RBC: 3.25 MIL/uL — ABNORMAL LOW (ref 3.87–5.11)
RDW: 15.5 % (ref 11.5–15.5)
WBC: 7.3 10*3/uL (ref 4.0–10.5)
nRBC: 0 % (ref 0.0–0.2)

## 2022-01-11 LAB — RESP PANEL BY RT-PCR (FLU A&B, COVID) ARPGX2
Influenza A by PCR: NEGATIVE
Influenza B by PCR: NEGATIVE
SARS Coronavirus 2 by RT PCR: NEGATIVE

## 2022-01-11 LAB — PROTIME-INR
INR: 1 (ref 0.8–1.2)
Prothrombin Time: 13.4 seconds (ref 11.4–15.2)

## 2022-01-11 NOTE — ED Notes (Signed)
Daughter Kirsten Mcmahon [phone] would like an update

## 2022-01-11 NOTE — ED Provider Notes (Signed)
[facility] EMERGENCY DEPARTMENT Provider Note   CSN: [id number] Arrival date & time: 01/11/22  1542     History  Chief Complaint  Patient presents with   Altered Mental Status    Kirsten Mcmahon is a 76 y.o. female.   Altered Mental Status Associated symptoms: no fever    Patient has a history of seizures, stroke, hemiparesis, diabetes, chronic kidney disease on dialysis since May 2022 who presents for evaluation of altered mental status after dialysis today.  Patient was brought in by EMS.  Apparently the last time she was known normal was at 1430.  Initially patient was not alert or oriented but on route became responsive to verbal stimuli.  Her symptoms rapidly improved.  Patient now is awake and other than complaining of a headache denies any other complaints.  She states that headaches have been ongoing for a long time.  She is not really sure why she is here.  She denies any chest pain or shortness of breath.  No vomiting or diarrhea.  Home Medications Prior to Admission medications   Medication Sig Start Date End Date Taking? Authorizing Provider  aspirin 81 MG chewable tablet Chew 1 tablet (81 mg total) by mouth daily. 08/01/21   Lavina Hamman, MD  calcitRIOL (ROCALTROL) 0.5 MCG capsule Take 1 capsule (0.5 mcg total) by mouth every Monday, Wednesday, and Friday with hemodialysis. 07/31/21   Lavina Hamman, MD  dextrose (GLUTOSE) 40 % GEL Place 28 mLs inside cheek as needed (for suspected low sugars). 07/31/21   Lavina Hamman, MD  lacosamide 100 MG TABS Take 1 tablet (100 mg total) by mouth 2 (two) times daily. 07/31/21   Lavina Hamman, MD  latanoprost (XALATAN) 0.005 % ophthalmic solution Place 1 drop into both eyes at bedtime. 09/07/18   Coral Spikes, DO  letrozole [facility]) 2.5 MG tablet Take 1 tablet (2.5 mg total) by mouth daily. 02/16/19   Nicholas Lose, MD  levothyroxine (SYNTHROID, LEVOTHROID) 112 MCG tablet Take 1 tablet (112 mcg total) by mouth  daily before breakfast. 09/07/18   Coral Spikes, DO  metoprolol succinate (TOPROL-XL) 50 MG 24 hr tablet Take 50 mg by mouth daily. 08/20/20   [provider]  Multiple Vitamin (MULTIVITAMIN) capsule Take 1 capsule by mouth daily. Lobbyist, Historical, MD  multivitamin (RENA-VIT) TABS tablet Take 1 tablet by mouth at bedtime. 07/31/21   Lavina Hamman, MD  Nutritional Supplements (,FEEDING SUPPLEMENT, PROSOURCE PLUS) liquid Take 30 mLs by mouth 3 (three) times daily between meals. 07/31/21   Lavina Hamman, MD  Nutritional Supplements (FEEDING SUPPLEMENT, BOOST BREEZE,) LIQD Take 1 each by mouth 3 (three) times daily. 07/31/21   Lavina Hamman, MD  oxyCODONE-acetaminophen (PERCOCET) 5-325 MG tablet Take 1 tablet by mouth every 6 (six) hours as needed for severe pain. 09/29/21 09/29/22  Baglia, Corrina, PA-C  sucroferric oxyhydroxide (VELPHORO) 500 MG chewable tablet Chew by mouth. 10/13/21   [provider]  VITAMIN D PO Take by mouth. 10/14/21 10/13/22  [provider]      Allergies    Contrast media [iodinated contrast media], Latex, Shellfish-derived products, and Levemir [insulin detemir]    Review of Systems   Review of Systems  Constitutional:  Negative for fever.  Respiratory:  Negative for shortness of breath.   Cardiovascular:  Negative for chest pain.   Physical Exam Updated Vital Signs BP 123/65    Pulse 88  Temp 97.6 F (36.4 C) (Oral)    Resp 16    SpO2 99%  Physical Exam Vitals and nursing note reviewed.  Constitutional:      Appearance: She is well-developed. She is not ill-appearing or diaphoretic.  HENT:     Head: Normocephalic and atraumatic.     Right Ear: External ear normal.     Left Ear: External ear normal.  Eyes:     General: No scleral icterus.       Right eye: No discharge.        Left eye: No discharge.     Conjunctiva/sclera: Conjunctivae normal.  Neck:     Trachea: No tracheal deviation.  Cardiovascular:      Rate and Rhythm: Normal rate and regular rhythm.  Pulmonary:     Effort: Pulmonary effort is normal. No respiratory distress.     Breath sounds: Normal breath sounds. No stridor. No wheezing or rales.  Abdominal:     General: Bowel sounds are normal. There is no distension.     Palpations: Abdomen is soft.     Tenderness: There is no abdominal tenderness. There is no guarding or rebound.  Musculoskeletal:        General: No tenderness or deformity.     Cervical back: Neck supple.  Skin:    General: Skin is warm and dry.     Findings: No rash.  Neurological:     Mental Status: She is alert.     GCS: GCS eye subscore is 4. GCS verbal subscore is 4. GCS motor subscore is 6.     Cranial Nerves: No cranial nerve deficit (no facial droop, extraocular movements intact, no slurred speech).     Motor: No abnormal muscle tone or seizure activity.     Coordination: Coordination normal.     Comments: Left-sided hemiparesis, difficulty moving left arm or left leg,, alert answering questions appropriately, disoriented to the date  Psychiatric:        Mood and Affect: Mood normal.    ED Results / Procedures / Treatments   Labs (all labs ordered are listed, but only abnormal results are displayed) Labs Reviewed  CBC - Abnormal; Notable for the following components:      Result Value   RBC 3.25 (*)    Hemoglobin 10.8 (*)    HCT 34.2 (*)    MCV 105.2 (*)    All other components within normal limits  I-STAT CHEM 8, ED - Abnormal; Notable for the following components:   BUN 29 (*)    Creatinine, Ser 5.50 (*)    Calcium, Ion 0.98 (*)    TCO2 35 (*)    Hemoglobin 11.9 (*)    HCT 35.0 (*)    All other components within normal limits  RESP PANEL BY RT-PCR (FLU A&B, COVID) ARPGX2  PROTIME-INR  APTT  DIFFERENTIAL  COMPREHENSIVE METABOLIC PANEL  ETHANOL  CBG MONITORING, ED    EKG EKG Interpretation  Date/Time:  Monday January 11 2022 15:51:09 EST Ventricular Rate:  95 PR  Interval:  127 QRS Duration: 81 QT Interval:  386 QTC Calculation: 486 R Axis:   -30 Text Interpretation: Sinus rhythm Left axis deviation Low voltage, precordial leads Borderline prolonged QT interval No significant change since last tracing Confirmed by Dorie Rank [phone]) on 01/11/2022 4:09:19 PM  Radiology CT HEAD WO CONTRAST  Result Date: 01/11/2022 CLINICAL DATA:  Neuro deficit, acute stroke suspected. EXAM: CT HEAD WITHOUT CONTRAST TECHNIQUE: Contiguous axial images were obtained from  the base of the skull through the vertex without intravenous contrast. RADIATION DOSE REDUCTION: This exam was performed according to the departmental dose-optimization program which includes automated exposure control, adjustment of the mA and/or kV according to patient size and/or use of iterative reconstruction technique. COMPARISON:  CT head dated July 20, 2021. FINDINGS: Brain: Right frontal lobe encephalomalacia with right-sided brainstem ambulating degeneration, likely sequela of prior MCA territory infarct. Multiple lacunar infarcts in the cerebellum and bilateral thalami. No evidence of acute hemorrhage, hydrocephalus or extra-axial collection or mass. Low-attenuation of the periventricular and subcortical white matter compatible with advanced chronic microvascular ischemic changes. Vascular: No hyperdense vessel or unexpected calcification. Skull: Right frontal craniectomy.  No acute osseous abnormality. Sinuses/Orbits: Paranasal sinuses are clear. Bilateral cataract surgery. Other: None. IMPRESSION: 1.  No acute intracranial abnormality. 2. Right frontal craniectomy, large right MCA territory infarct, multiple bilateral cerebellar and thalamic lacunar infarcts and advanced chronic microvascular ischemic changes of the white matter, unchanged. 3. MRI examination could be considered for further evaluation if clinically warranted. Electronically Signed   By: Keane Police D.O.   On: 01/11/2022 17:43     Procedures .1-3 Lead EKG Interpretation Performed by: Dorie Rank, MD Authorized by: Dorie Rank, MD     Interpretation: normal     ECG rate:  80   ECG rate assessment: normal     Rhythm: sinus rhythm     Ectopy: none     Conduction: normal   Comments:     7:16 PM     Medications Ordered in ED Medications - No data to display  ED Course/ Medical Decision Making/ A&P Clinical Course as of 01/11/22 1916  Mon Jan 11, 2022  1808 CBC(!) [JK]  5329 Anemia stable when compared to previous values  [JK]  1808 Resp Panel by RT-PCR (Flu A&B, Covid) Nasopharyngeal Swab Covid and flu are negative [JK]  1809 CT HEAD WO CONTRAST Head CT images and radiology report reviewed.  Evidence of prior strokes but no acute findings noted [JK]  1833 Notifed that chemistry analyzer id down. Istate reviewed and no significant electrolyte findings noted.  Will not make pt wait longer [JK]    Clinical Course User Index [JK] Dorie Rank, MD                           Medical Decision Making Amount and/or Complexity of Data Reviewed Labs: ordered. Decision-making details documented in ED Course. Radiology: ordered. Decision-making details documented in ED Course.   Altered mental status Patient presented to the ED for an evaluation of an episode of altered mental status.  This event occurred after dialysis.  Concerned about the possibility of stroke TIA seizure as well as syncopal event.  Patient was back at her baseline when she arrived in the ED.  No acute neuro findings.  She has been monitored for several hours and she has remained stable.  No signs of acute abnormality on head CT.  Patient has not had any seizure activity while in the ED.  Cardiac rhythm has remained normal without any recurrent syncopal events.  Possible patient may have had a syncopal episode associated with fluid shifts from her dialysis.  At this time she is stable and has no complaints.  Her neurologic exam is at her baseline.     Evaluation and diagnostic testing in the emergency department does not suggest an emergent condition requiring admission or immediate intervention beyond what has been performed at  this time.  The patient is safe for discharge and has been instructed to return immediately for worsening symptoms, change in symptoms or any other concerns.         Final Clinical Impression(s) / ED Diagnoses Final diagnoses:  Syncope, unspecified syncope type  Stage 5 chronic kidney disease on chronic dialysis [facility])    Rx / DC Orders ED Discharge Orders     None         Dorie Rank, MD 01/11/22 1916

## 2022-01-11 NOTE — ED Notes (Signed)
Son called to update on discharge instructions  Pt wheeled out to lobby with daughter

## 2022-01-11 NOTE — ED Notes (Signed)
Kirsten Mcmahon (Son) called asking for an update when there is info. [phone]

## 2022-01-11 NOTE — Discharge Instructions (Signed)
Continue your current medications.  Return to the ED for any recurrent episodes.  Follow-up with your doctor to be rechecked

## 2022-01-11 NOTE — ED Triage Notes (Signed)
Pt from dialysis via [facility] for eval of AMS after full tx. LSN approx 1430. Hx CVA, L sided deficits/weakness. Pt initially A&O0, then responsive to verbal stimuli en route. Pt A&O3 on arrival to ED. Pt has no physical complaints in triage.   112/70 HR 80 98% RA  R side restriction

## 2022-03-08 ENCOUNTER — Emergency Department (HOSPITAL_COMMUNITY)
Admission: EM | Admit: 2022-03-08 | Discharge: 2022-03-08 | Disposition: A | Discharge status: Home / Self Care | Payer: Medicare (Managed Care) | Attending: Emergency Medicine | Admitting: Emergency Medicine

## 2022-03-08 ENCOUNTER — Encounter (HOSPITAL_COMMUNITY): Payer: Self-pay | Admitting: *Deleted

## 2022-03-08 ENCOUNTER — Emergency Department (HOSPITAL_COMMUNITY): Payer: Medicare (Managed Care)

## 2022-03-08 ENCOUNTER — Other Ambulatory Visit: Payer: Self-pay

## 2022-03-08 DIAGNOSIS — Z91040 Latex allergy status: Secondary | ICD-10-CM | POA: Diagnosis not present

## 2022-03-08 DIAGNOSIS — Z7982 Long term (current) use of aspirin: Secondary | ICD-10-CM | POA: Diagnosis not present

## 2022-03-08 DIAGNOSIS — R0602 Shortness of breath: Secondary | ICD-10-CM | POA: Diagnosis not present

## 2022-03-08 DIAGNOSIS — Z79899 Other long term (current) drug therapy: Secondary | ICD-10-CM | POA: Insufficient documentation

## 2022-03-08 DIAGNOSIS — Z992 Dependence on renal dialysis: Secondary | ICD-10-CM | POA: Insufficient documentation

## 2022-03-08 DIAGNOSIS — G44209 Tension-type headache, unspecified, not intractable: Secondary | ICD-10-CM | POA: Insufficient documentation

## 2022-03-08 DIAGNOSIS — R0600 Dyspnea, unspecified: Secondary | ICD-10-CM

## 2022-03-08 DIAGNOSIS — E039 Hypothyroidism, unspecified: Secondary | ICD-10-CM | POA: Diagnosis not present

## 2022-03-08 DIAGNOSIS — R519 Headache, unspecified: Secondary | ICD-10-CM | POA: Diagnosis present

## 2022-03-08 DIAGNOSIS — N186 End stage renal disease: Secondary | ICD-10-CM | POA: Insufficient documentation

## 2022-03-08 DIAGNOSIS — I120 Hypertensive chronic kidney disease with stage 5 chronic kidney disease or end stage renal disease: Secondary | ICD-10-CM | POA: Diagnosis not present

## 2022-03-08 DIAGNOSIS — J479 Bronchiectasis, uncomplicated: Secondary | ICD-10-CM | POA: Insufficient documentation

## 2022-03-08 DIAGNOSIS — E1122 Type 2 diabetes mellitus with diabetic chronic kidney disease: Secondary | ICD-10-CM | POA: Insufficient documentation

## 2022-03-08 LAB — CBC WITH DIFFERENTIAL/PLATELET
Abs Immature Granulocytes: 0.02 10*3/uL (ref 0.00–0.07)
Basophils Absolute: 0 10*3/uL (ref 0.0–0.1)
Basophils Relative: 0 %
Eosinophils Absolute: 0.2 10*3/uL (ref 0.0–0.5)
Eosinophils Relative: 3 %
HCT: 41.1 % (ref 36.0–46.0)
Hemoglobin: 12.9 g/dL (ref 12.0–15.0)
Immature Granulocytes: 0 %
Lymphocytes Relative: 28 %
Lymphs Abs: 1.5 10*3/uL (ref 0.7–4.0)
MCH: 34.4 pg — ABNORMAL HIGH (ref 26.0–34.0)
MCHC: 31.4 g/dL (ref 30.0–36.0)
MCV: 109.6 fL — ABNORMAL HIGH (ref 80.0–100.0)
Monocytes Absolute: 0.4 10*3/uL (ref 0.1–1.0)
Monocytes Relative: 8 %
Neutro Abs: 3.4 10*3/uL (ref 1.7–7.7)
Neutrophils Relative %: 61 %
Platelets: 87 10*3/uL — ABNORMAL LOW (ref 150–400)
RBC: 3.75 MIL/uL — ABNORMAL LOW (ref 3.87–5.11)
RDW: 13.8 % (ref 11.5–15.5)
WBC: 5.5 10*3/uL (ref 4.0–10.5)
nRBC: 0 % (ref 0.0–0.2)

## 2022-03-08 LAB — BRAIN NATRIURETIC PEPTIDE: B Natriuretic Peptide: 98.2 pg/mL (ref 0.0–100.0)

## 2022-03-08 LAB — BASIC METABOLIC PANEL
Anion gap: 17 — ABNORMAL HIGH (ref 5–15)
BUN: 67 mg/dL — ABNORMAL HIGH (ref 8–23)
CO2: 25 mmol/L (ref 22–32)
Calcium: 9.1 mg/dL (ref 8.9–10.3)
Chloride: 98 mmol/L (ref 98–111)
Creatinine, Ser: 10.12 mg/dL — ABNORMAL HIGH (ref 0.44–1.00)
GFR, Estimated: 4 mL/min — ABNORMAL LOW (ref >60)
Glucose, Bld: 108 mg/dL — ABNORMAL HIGH (ref 70–99)
Potassium: 5 mmol/L (ref 3.5–5.1)
Sodium: 140 mmol/L (ref 135–145)

## 2022-03-08 LAB — D-DIMER, QUANTITATIVE: D-Dimer, Quant: 1.8 ug/mL-FEU — ABNORMAL HIGH (ref 0.00–0.50)

## 2022-03-08 LAB — TROPONIN I (HIGH SENSITIVITY): Troponin I (High Sensitivity): 10 ng/L (ref <18)

## 2022-03-08 IMAGING — CT CT CHEST W/O CM
2 of 4 series · 15 of 36 positions shown, 18 images · non-contrast
Comparison: None.

CLINICAL DATA: Concern for infection



[Series 3: chest wo · axial · 0.59mm/px · z∈[+714,+934]mm · 12 of 132 slices shown, 15 images]
[im 11/132  mediastinal]
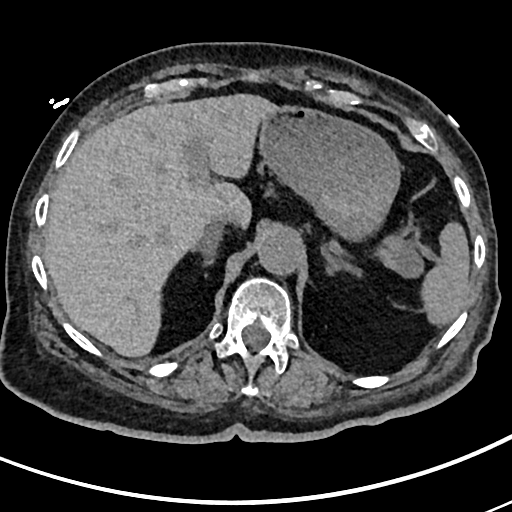
[im 11/132  lung]
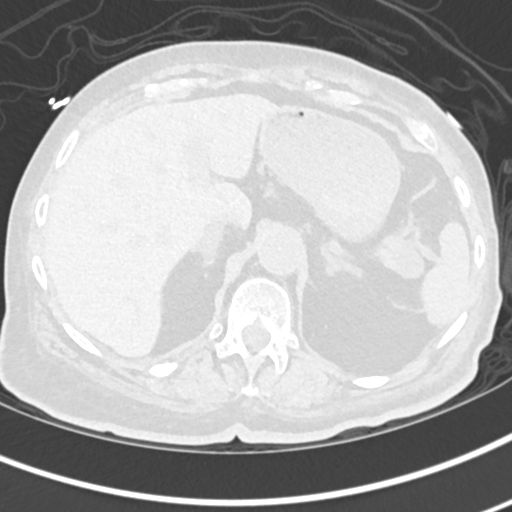
[im 21/132  lung]
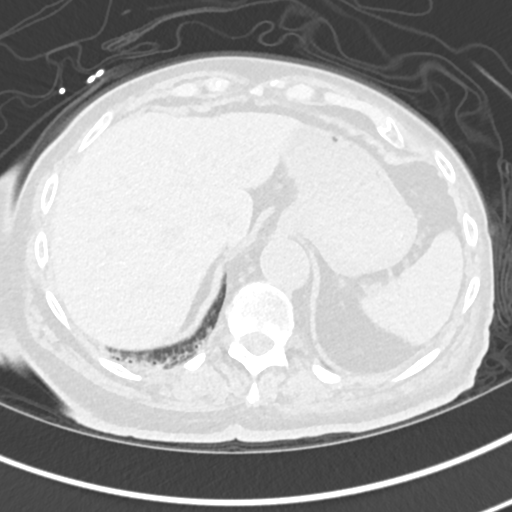
[im 31/132  lung]
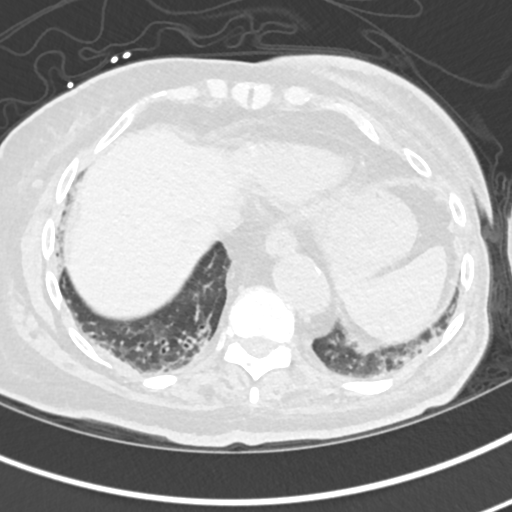
[im 41/132  lung]
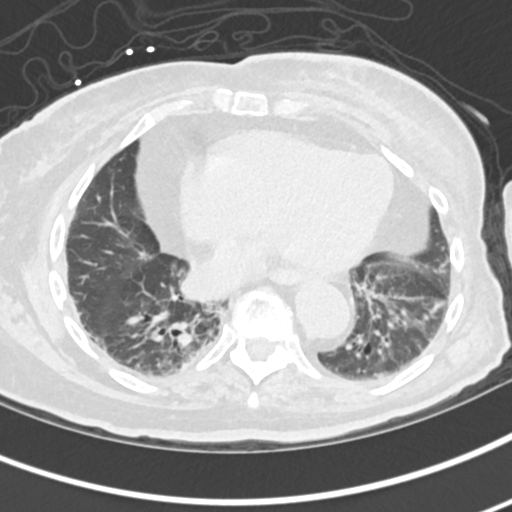
[im 51/132  mediastinal]
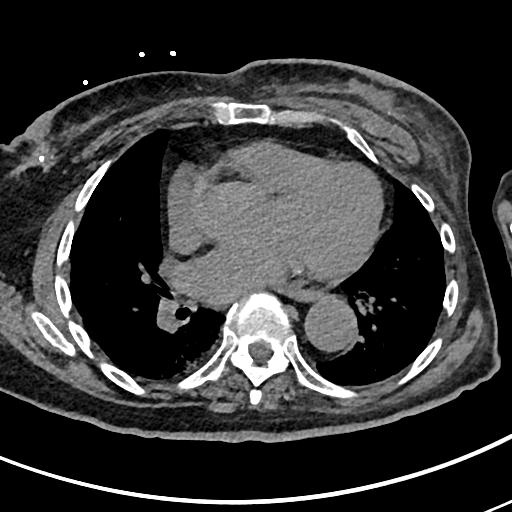
[im 51/132  lung]
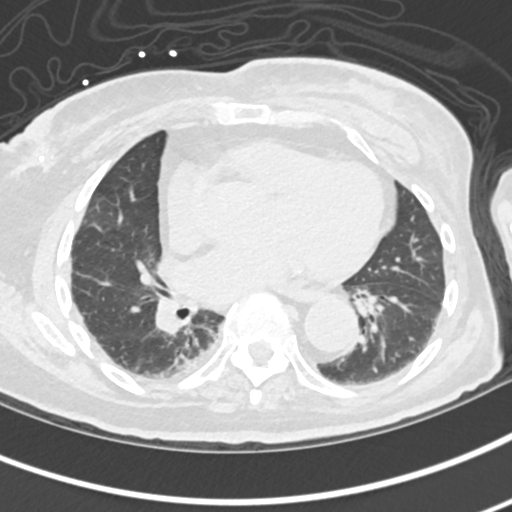
[im 61/132  lung]
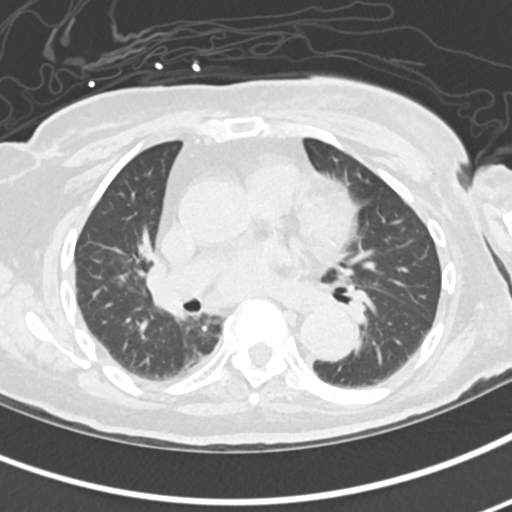
[im 71/132  lung]
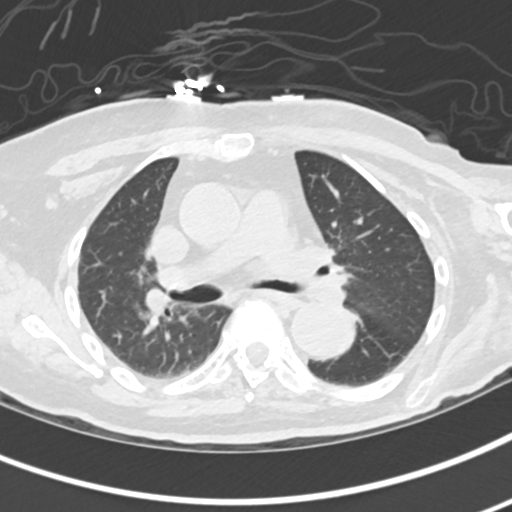
[im 81/132  lung]
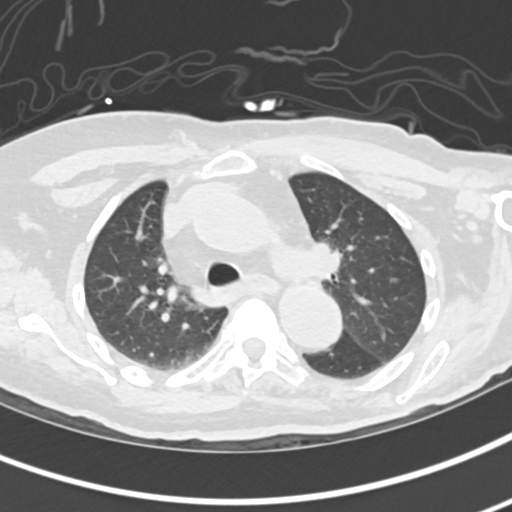
[im 91/132  mediastinal]
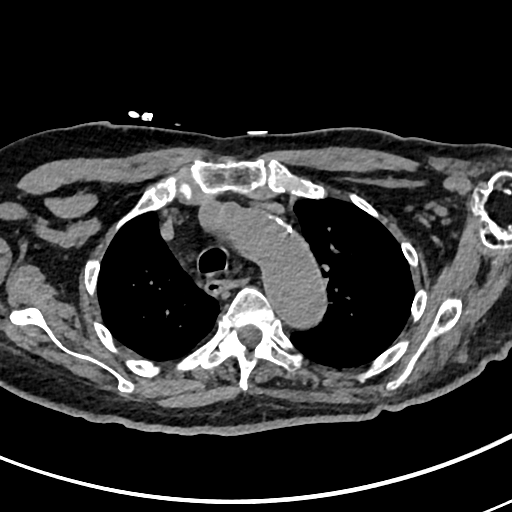
[im 91/132  lung]
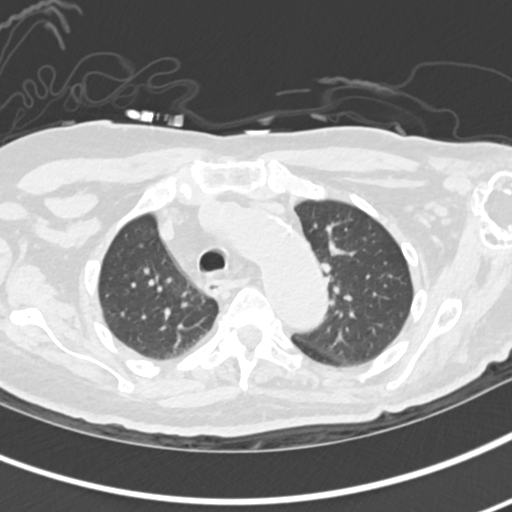
[im 101/132  lung]
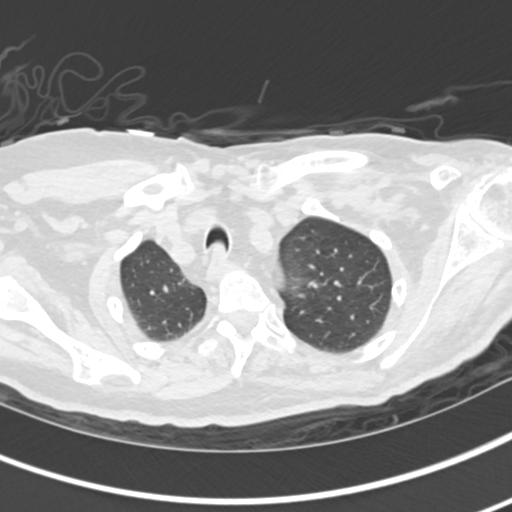
[im 111/132  lung]
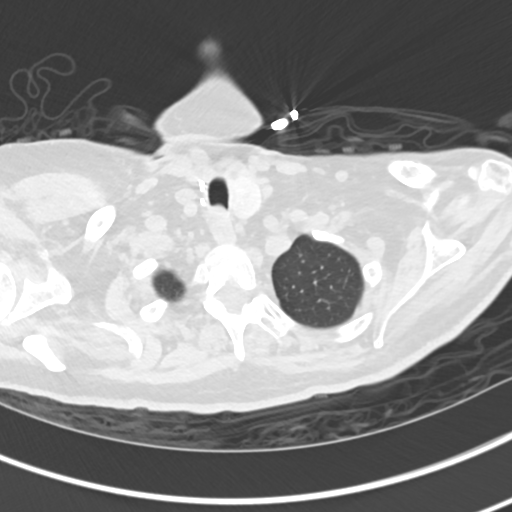
[im 121/132  lung]
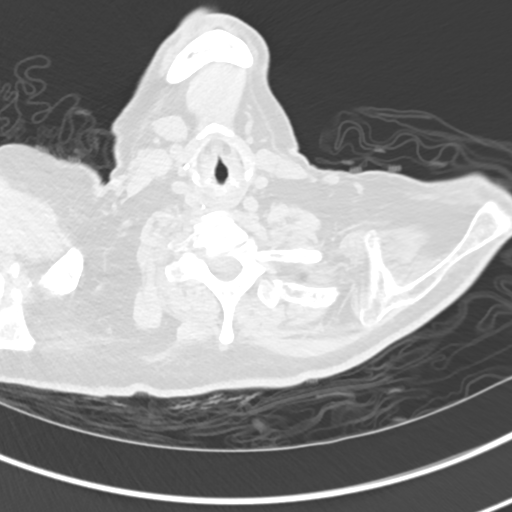

[Series 6: cor · coronal · 0.53mm/px · 3 of 113 slices shown]
[im 23/113  lung]
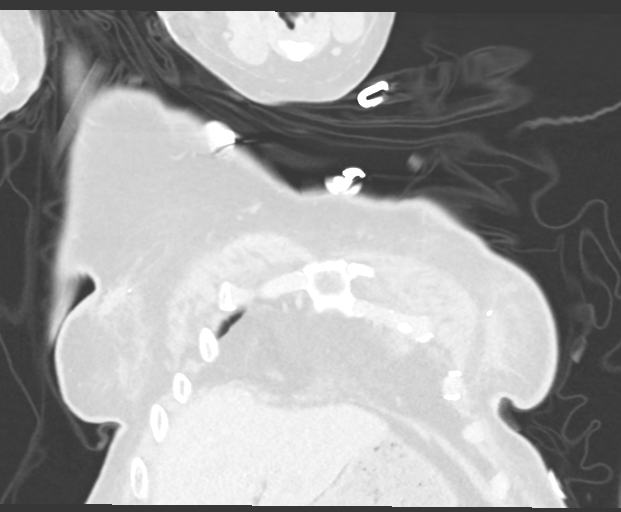
[im 45/113  lung]
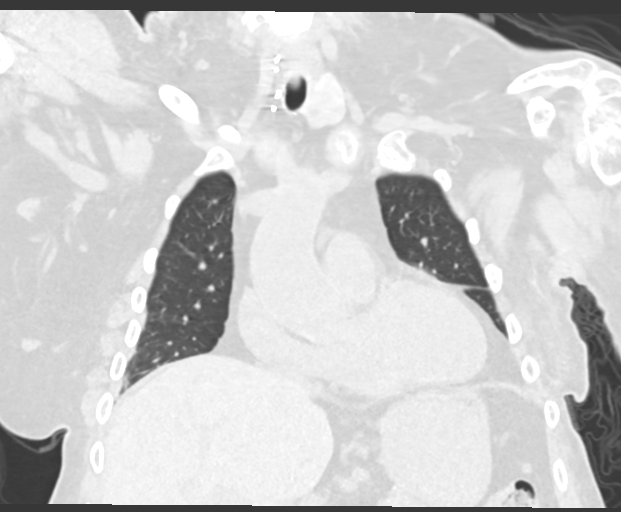
[im 68/113  lung]
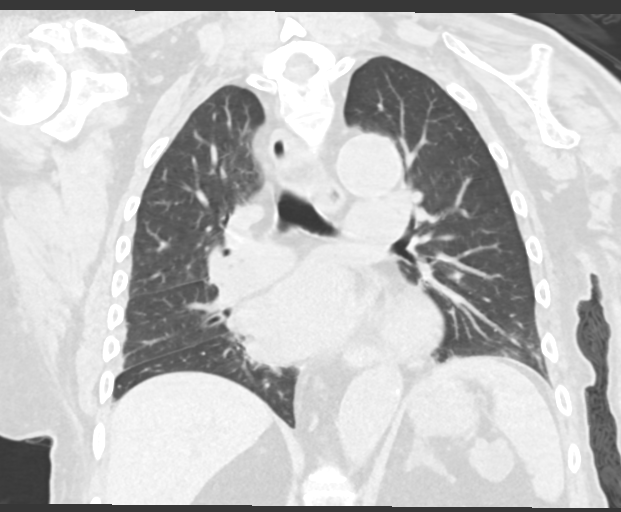

[15 of 36 positions shown; findings below may reference images not displayed]

FINDINGS: Cardiovascular: Cardiomegaly. No pericardial effusion.
Atherosclerotic disease of the thoracic aorta. Coronary artery
calcifications of the LAD.

Mediastinum/Nodes: Esophagus is unremarkable. Prior right
thyroidectomy. No pathologically enlarged lymph nodes seen in the
chest.

Lungs/Pleura: Central airways are patent. Mild bilateral lower lobe
bronchiectasis. Mild bibasilar atelectasis. No consolidation,
pleural effusion or pneumothorax.

Upper Abdomen: No acute abnormality.

Musculoskeletal: No chest wall mass or suspicious bone lesions
identified. Postsurgical changes of the right breast.
IMPRESSION: 1. No evidence of infection.
2. Mild bilateral lower lobe bronchiectasis, likely sequela of prior
infection or aspiration.
3. Cardiomegaly, coronary artery calcifications and aortic
Atherosclerosis ([VK]-[VK]).

## 2022-03-08 IMAGING — CR DG CHEST 1V
1 series · 1 of 1 positions shown · non-contrast
Comparison: Portable chest [DATE] and earlier.

CLINICAL DATA: 75-year-old female with chest pain and headache.
Recent cough and cold. Dialysis patient.

EXAM:
CHEST  1 VIEW

[chest ap]
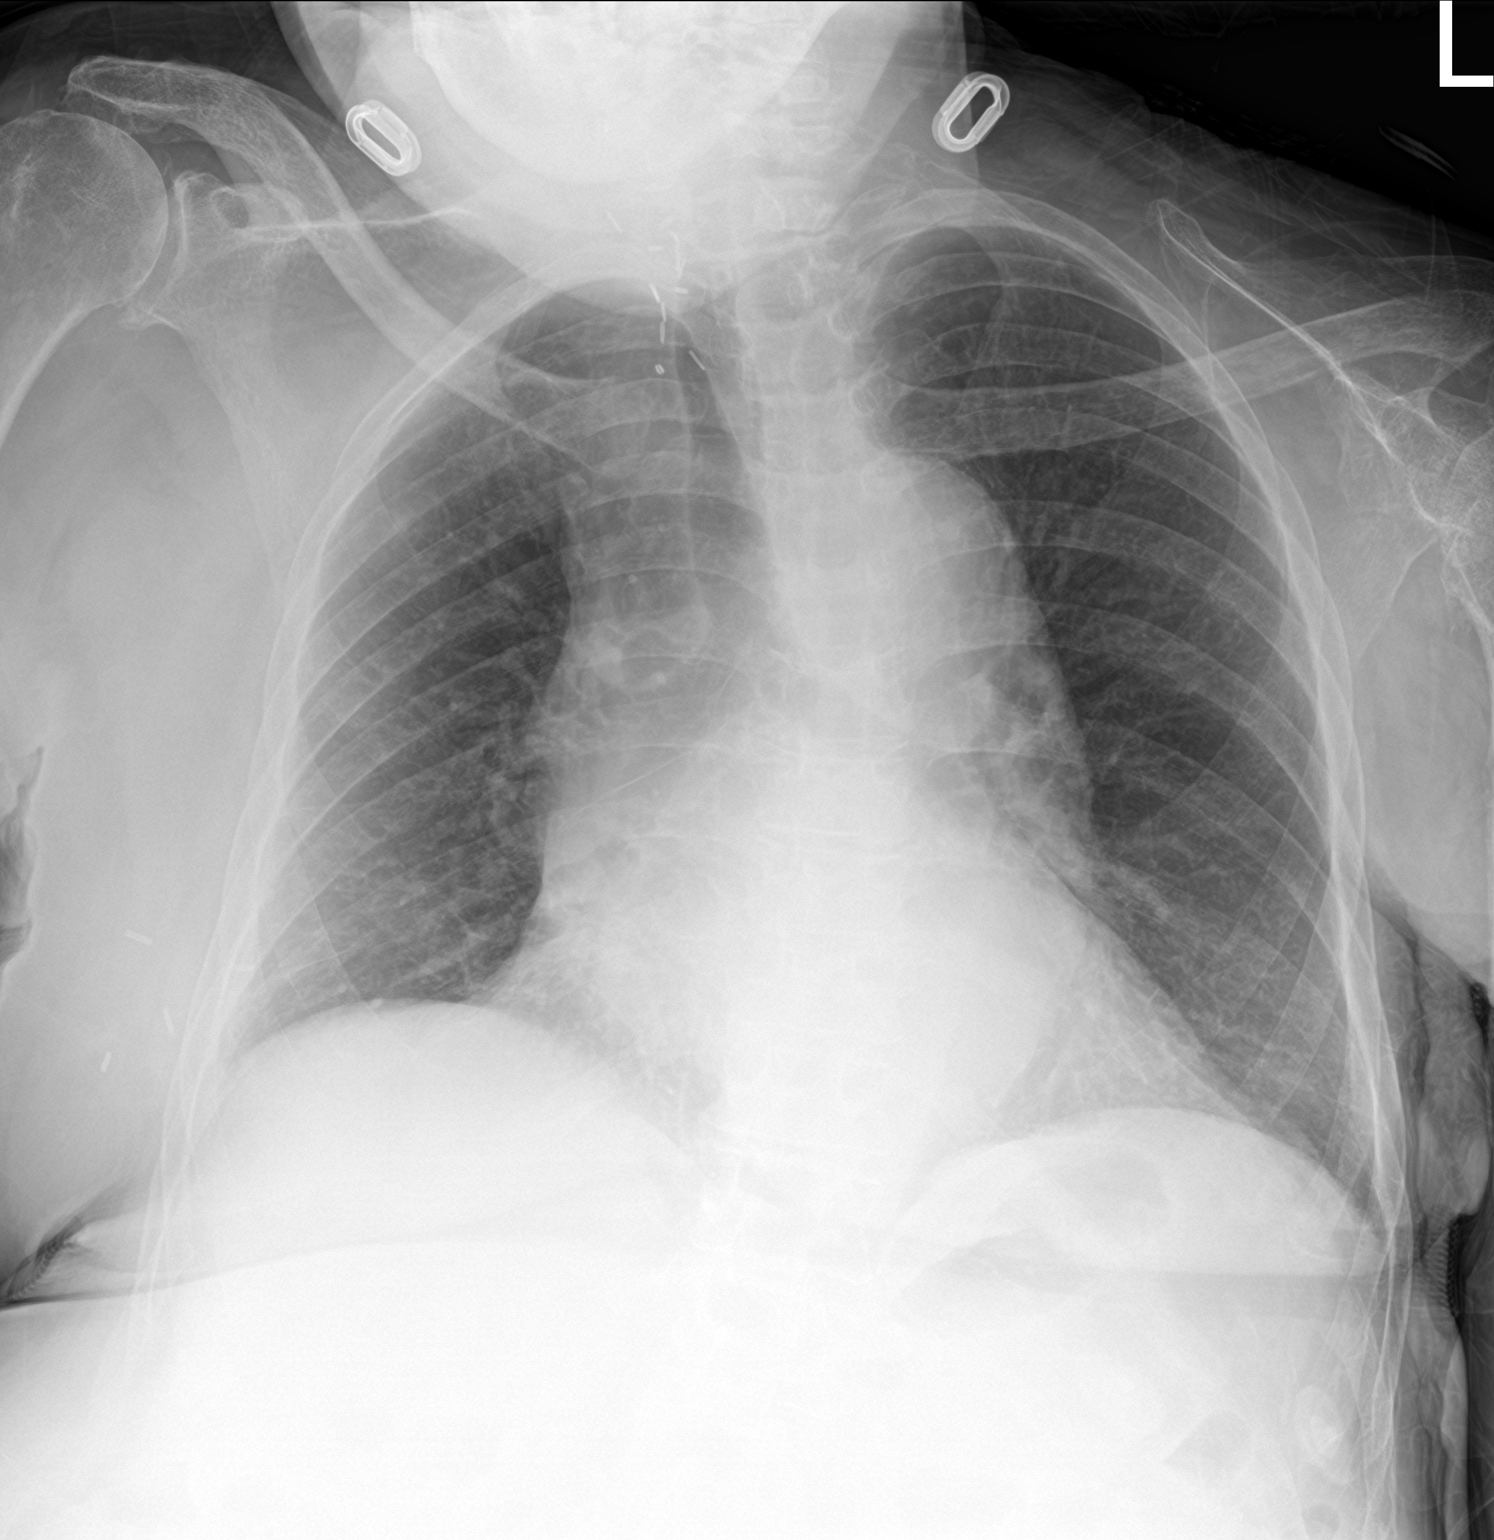

[1 of 1 positions shown; findings below may reference images not displayed]

FINDINGS: AP view at [MJ] hours. Right chest dialysis catheter has been
removed since last year. Evidence of prior thyroidectomy. Mild
tortuosity of the thoracic aorta. Other mediastinal contours are
within normal limits. Visualized tracheal air column is within
normal limits. Lung volumes and ventilation are within normal
limits. No pneumothorax or pleural effusion. Chronic right
breast/chest wall surgical clips. No acute osseous abnormality
identified. Paucity of bowel gas.
IMPRESSION: No acute cardiopulmonary abnormality.

## 2022-03-08 IMAGING — CT CT HEAD W/O CM
4 series · 16 of 47 positions shown, 18 images · non-contrast
Comparison: [DATE]

CLINICAL DATA: Headache.  History of stroke



[Series 3: head wo · axial · 0.44mm/px · z∈[+1026,+1142]mm · 7 of 31 slices shown, 9 images]
[im 4/31  brain]
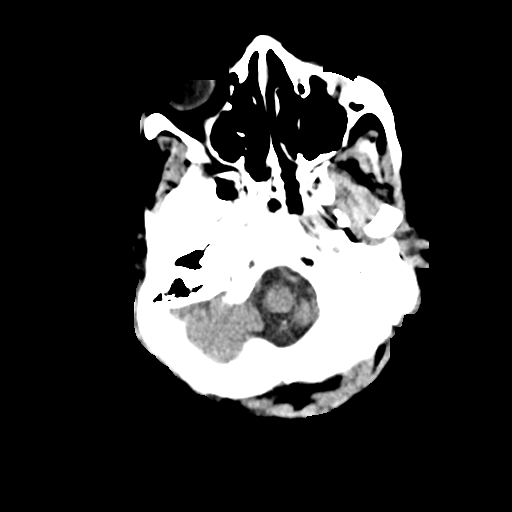
[im 4/31  bone]
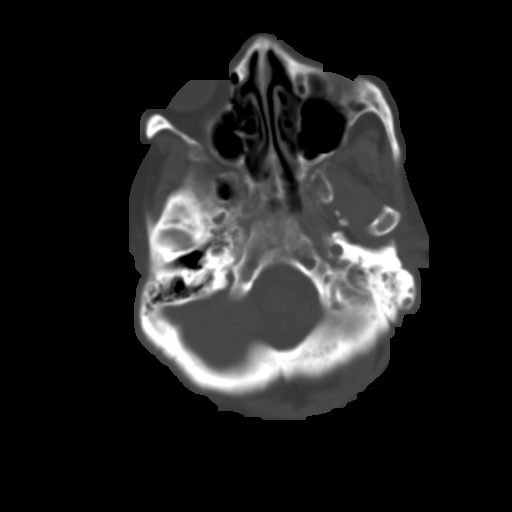
[im 8/31  brain]
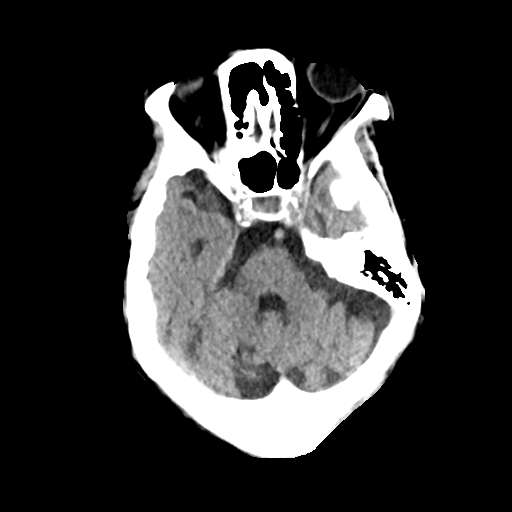
[im 12/31  brain]
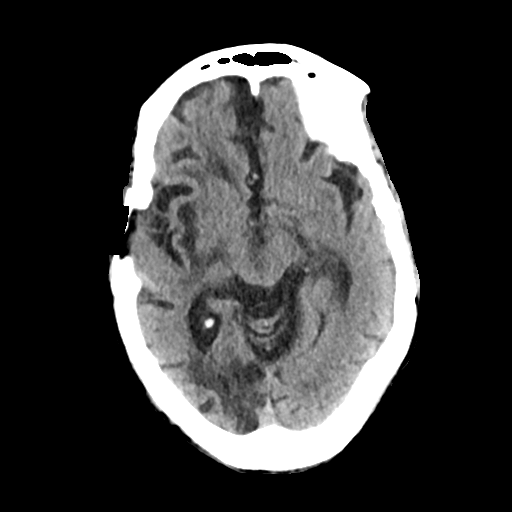
[im 16/31  brain]
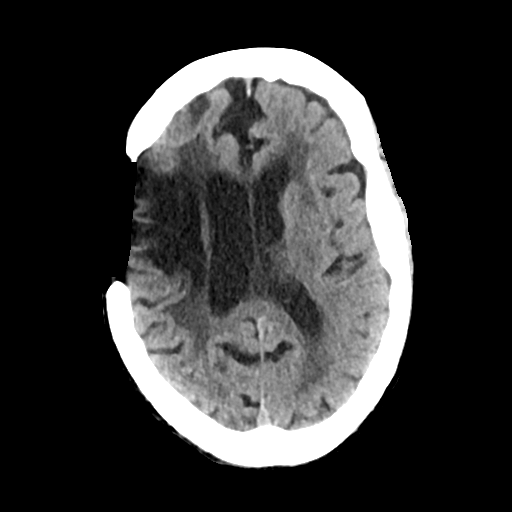
[im 19/31  brain]
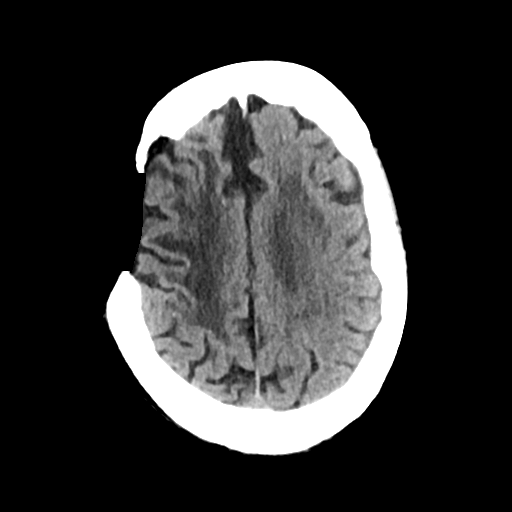
[im 19/31  bone]
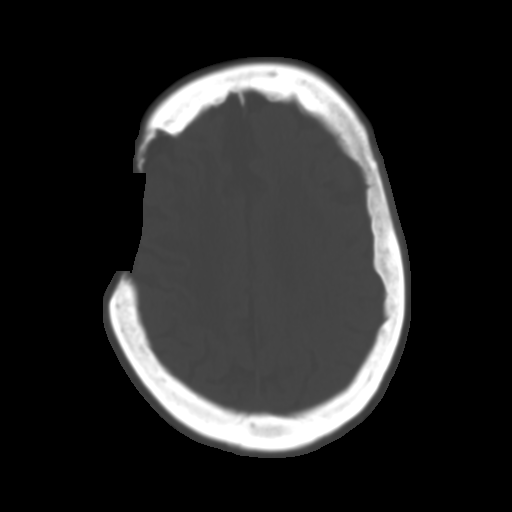
[im 23/31  brain]
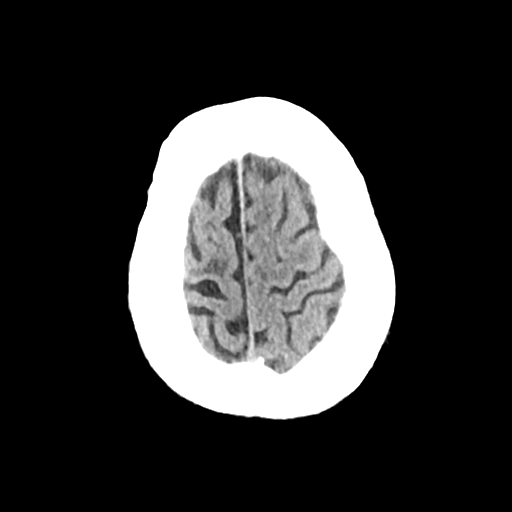
[im 27/31  brain]
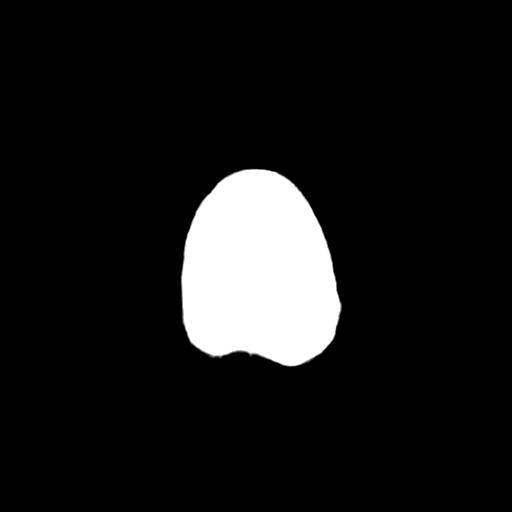

[Series 4: head bone · axial · 0.44mm/px · z∈[+1026,+1056]mm · 3 of 76 slices shown]
[im 8/76  bone]
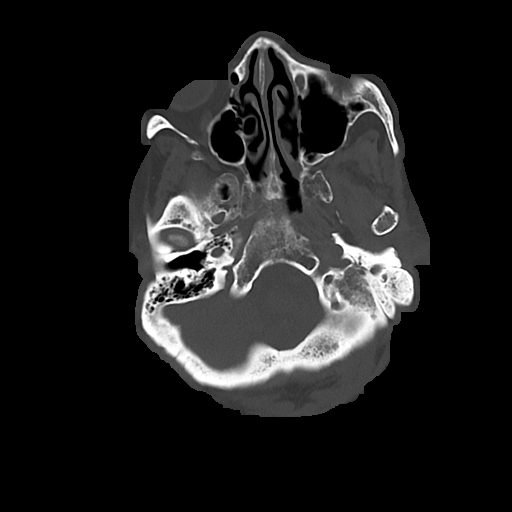
[im 16/76  bone]
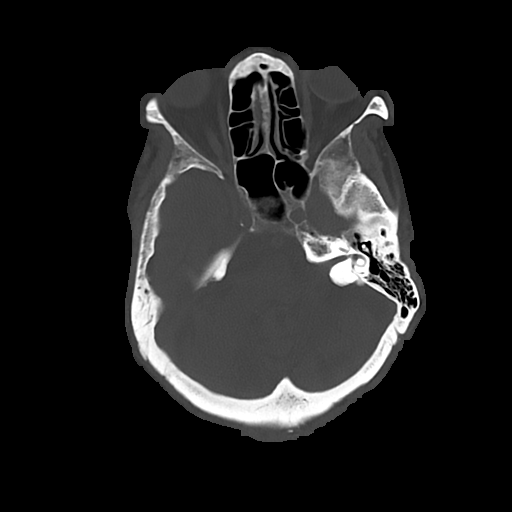
[im 23/76  bone]
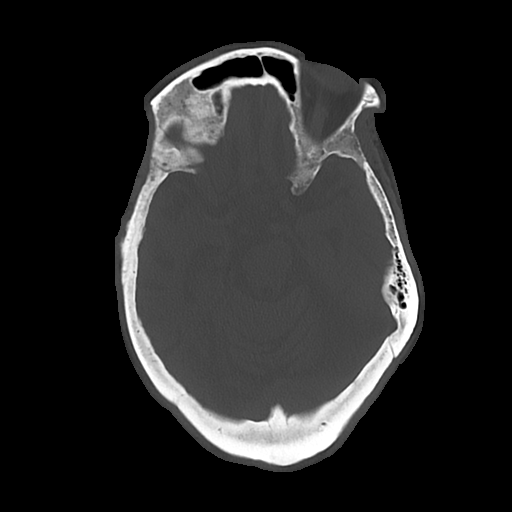

[Series 5: cor soft · coronal · 0.33mm/px · 3 of 66 slices shown]
[im 22/66  brain]
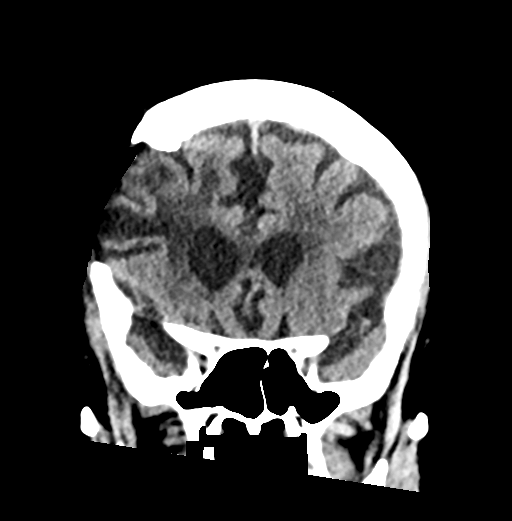
[im 29/66  brain]
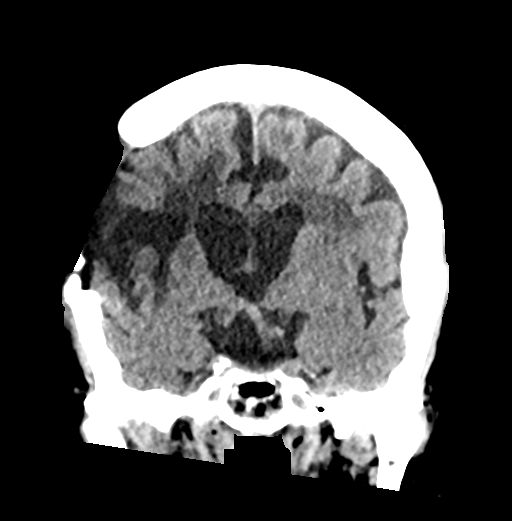
[im 37/66  brain]
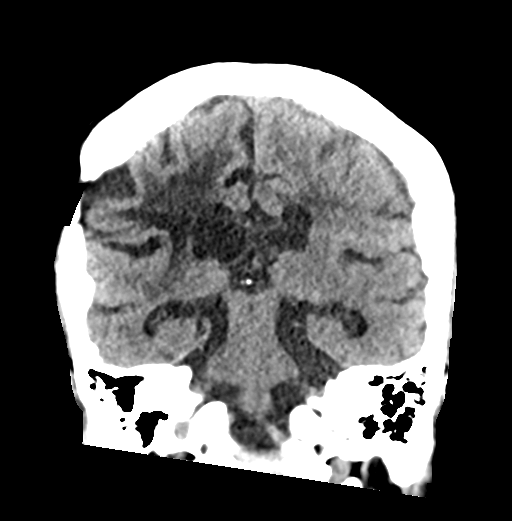

[Series 6: sag soft · sagittal · 0.32mm/px · 3 of 48 slices shown]
[im 17/48  brain]
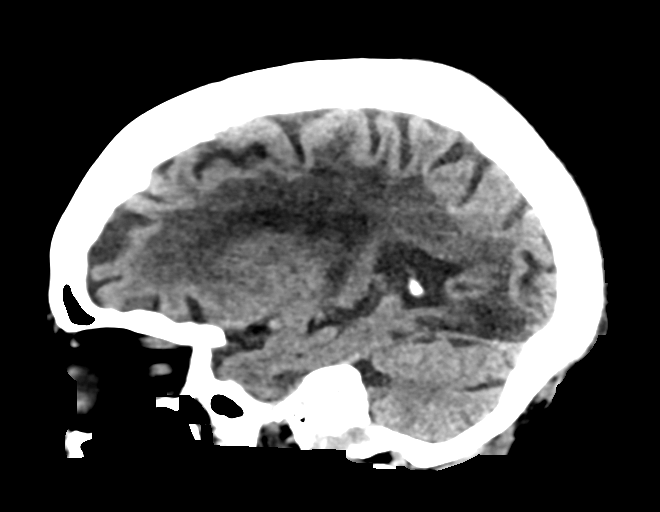
[im 24/48  brain]
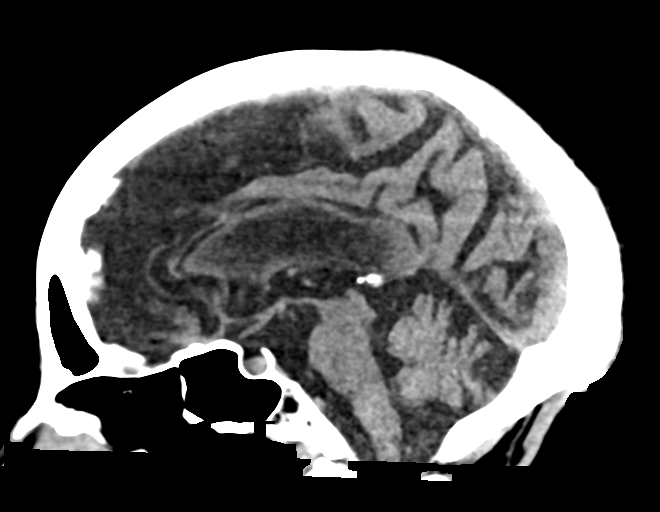
[im 31/48  brain]
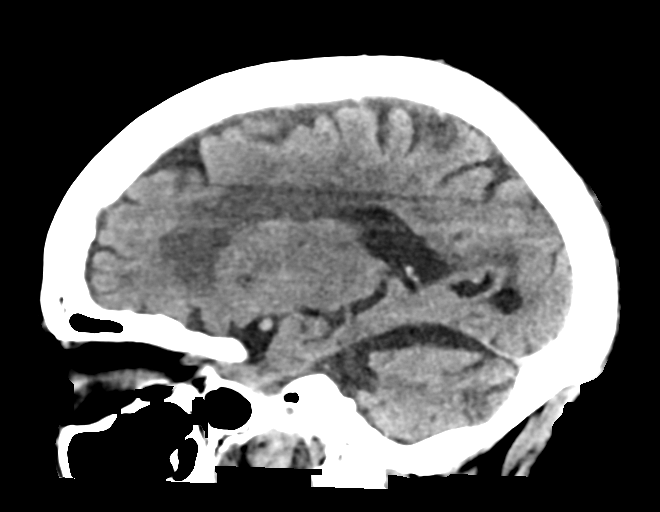

[16 of 47 positions shown; findings below may reference images not displayed]

FINDINGS: Brain: Multifocal areas of encephalomalacia from previous infarcts
including within the right MCA territory, right occipital lobe, and
bilateral cerebellar hemispheres. No new large territory acute
infarction. No intracranial hemorrhage. Extensive low-density
changes within the periventricular and subcortical white matter
compatible with chronic microvascular ischemic change. Moderate
diffuse cerebral volume loss.

Vascular: Atherosclerotic calcifications involving the large vessels
of the skull base. No unexpected hyperdense vessel.

Skull: Previous right frontal craniectomy. No acute calvarial
fracture.

Sinuses/Orbits: No acute finding.

Other: None.
IMPRESSION: 1. No acute intracranial abnormality.
2. Multifocal areas of encephalomalacia from previous infarcts.
3. Chronic microvascular ischemic disease and cerebral volume loss.

## 2022-03-08 MED ORDER — ACETAMINOPHEN 500 MG PO TABS
1000.0000 mg | ORAL_TABLET | Freq: Once | ORAL | Status: AC
  Administered 2022-03-08: 1000 mg via ORAL
  Filled 2022-03-08: qty 2

## 2022-03-08 MED ORDER — METOCLOPRAMIDE HCL 10 MG PO TABS: 5.0000 mg | ORAL_TABLET | Freq: Once | ORAL | Status: DC

## 2022-03-08 MED ORDER — DIPHENHYDRAMINE HCL 50 MG/ML IJ SOLN: 12.5000 mg | Freq: Once | INTRAMUSCULAR | Status: DC

## 2022-03-08 MED ORDER — METOCLOPRAMIDE HCL 5 MG/ML IJ SOLN: 10.0000 mg | Freq: Once | INTRAMUSCULAR | Status: DC

## 2022-03-08 NOTE — ED Notes (Signed)
Phlebotomy at bedside.

## 2022-03-08 NOTE — ED Notes (Signed)
RN could not get labs...RN reached out to phlebotomy  ?

## 2022-03-08 NOTE — ED Notes (Addendum)
MD notified about pts labs. MD asked RN to put in an IV consult. RN put in consult for IV team  ?

## 2022-03-08 NOTE — ED Provider Notes (Signed)
?[facility] ?Provider Note ? ? ?CSN: [id number] ?Arrival date & time: 03/08/22  1019 ? ?  ? ?History ? ?Chief Complaint  ?Patient presents with  ? Chest Pain  ? Headache  ? ? ?Kirsten Mcmahon is a 76 y.o. female. ? ? ?Chest Pain ?Associated symptoms: cough, headache and shortness of breath   ?Headache ?Associated symptoms: cough   ? ?76 year old female with a history of CVA with residual left-sided hemibody deficits, DM 2, ESRD on hemodialysis Monday Wednesday Friday, HTN, HLD, hypothyroidism, GERD, who presents to the emergency department with roughly 2 weeks of intermittent sharp right-sided chest pain and headache.  The patient denies any new or worsening neurologic deficits.  She has residual left hemibody deficits.  She was supposed to go to dialysis today on her regular Monday Wednesday Friday schedule.  She last went to dialysis on Friday.  She endorses mild shortness of breath.  Her chest discomfort is mildly pleuritic.  She endorses a tension type frontal headache with no radiation that has been bothering her for the past week.  She has had URI/cold symptoms for one week.  ? ?Home Medications ?Prior to Admission medications   ?Medication Sig Start Date End Date Taking? Authorizing Provider  ?aspirin 81 MG chewable tablet Chew 1 tablet (81 mg total) by mouth daily. 08/01/21  Yes Lavina Hamman, MD  ?B Complex-C-Zn-Folic Acid (DIALYVITE 335 WITH ZINC) 0.8 MG TABS Take 1 tablet by mouth daily. 01/13/22  Yes [provider]  ?calcitRIOL (ROCALTROL) 0.5 MCG capsule Take 1 capsule (0.5 mcg total) by mouth every Monday, Wednesday, and Friday with hemodialysis. 07/31/21  Yes Lavina Hamman, MD  ?dextrose (GLUTOSE) 40 % GEL Place 28 mLs inside cheek as needed (for suspected low sugars). 07/31/21  Yes Lavina Hamman, MD  ?lacosamide 100 MG TABS Take 1 tablet (100 mg total) by mouth 2 (two) times daily. 07/31/21  Yes Lavina Hamman, MD  ?latanoprost (XALATAN) 0.005 %  ophthalmic solution Place 1 drop into both eyes at bedtime. 09/07/18  Yes Cook, Michael Boston G, DO  ?letrozole (FEMARA) 2.5 MG tablet Take 1 tablet (2.5 mg total) by mouth daily. 02/16/19  Yes Nicholas Lose, MD  ?levothyroxine (SYNTHROID, LEVOTHROID) 112 MCG tablet Take 1 tablet (112 mcg total) by mouth daily before breakfast. 09/07/18  Yes Thersa Salt G, DO  ?lidocaine-prilocaine (EMLA) cream Apply 1 application. topically 3 (three) times a week. 02/04/22  Yes [provider]  ?metoprolol succinate (TOPROL-XL) 50 MG 24 hr tablet Take 50 mg by mouth daily. 08/20/20  Yes [provider]  ?Nutritional Supplements (,FEEDING SUPPLEMENT, PROSOURCE PLUS) liquid Take 30 mLs by mouth 3 (three) times daily between meals. 07/31/21  Yes Lavina Hamman, MD  ?Nutritional Supplements (FEEDING SUPPLEMENT, BOOST BREEZE,) LIQD Take 1 each by mouth 3 (three) times daily. 07/31/21  Yes Lavina Hamman, MD  ?sucroferric oxyhydroxide (VELPHORO) 500 MG chewable tablet Chew 500 mg by mouth 3 (three) times daily with meals. 10/13/21  Yes [provider]  ?multivitamin (RENA-VIT) TABS tablet Take 1 tablet by mouth at bedtime. ?Patient not taking: Reported on 03/08/2022 07/31/21   Lavina Hamman, MD  ?   ? ?Allergies    ?Contrast media [iodinated contrast media], Latex, and Levemir [insulin detemir]   ? ?Review of Systems   ?Review of Systems  ?Respiratory:  Positive for cough and shortness of breath.   ?Cardiovascular:  Positive for chest pain.  ?Neurological:  Positive for headaches.  ?  All other systems reviewed and are negative. ? ?Physical Exam ?Updated Vital Signs ?BP 115/66 (BP Location: Right Arm)   Pulse 71   Temp 98.5 ?F (36.9 ?C) (Oral)   Resp 16   SpO2 100%  ?Physical Exam ?Vitals and nursing note reviewed.  ?Constitutional:   ?   General: She is not in acute distress. ?HENT:  ?   Head: Normocephalic and atraumatic.  ?Eyes:  ?   Conjunctiva/sclera: Conjunctivae normal.  ?   Pupils: Pupils are equal, round, and  reactive to light.  ?Neck:  ?   Vascular: No JVD.  ?Cardiovascular:  ?   Rate and Rhythm: Normal rate and regular rhythm.  ?   Heart sounds: Normal heart sounds.  ?Pulmonary:  ?   Effort: Pulmonary effort is normal. No respiratory distress.  ?   Breath sounds: Normal breath sounds.  ?Abdominal:  ?   General: There is no distension.  ?   Tenderness: There is no guarding.  ?Musculoskeletal:     ?   General: No deformity or signs of injury.  ?   Cervical back: Neck supple.  ?   Left lower leg: No edema.  ?Skin: ?   Findings: No lesion or rash.  ?Neurological:  ?   General: No focal deficit present.  ?   Mental Status: She is alert. Mental status is at baseline.  ?   Comments: MENTAL STATUS EXAM:    ?Orientation: Alert and oriented to person, place and time.  ?Memory: Cooperative, follows commands well.  ?Language: Speech is clear and language is normal.  ? ?CRANIAL NERVES:    ?CN 2 (Optic): Visual fields intact to confrontation.  ?CN 3,4,6 (EOM): Pupils equal and reactive to light. Full extraocular eye movement without nystagmus.  ?CN 5 (Trigeminal): Facial sensation is normal, no weakness of masticatory muscles.  ?CN 7 (Facial): No facial weakness or asymmetry.  ?CN 8 (Auditory): Auditory acuity grossly normal.  ?CN 9,10 (Glossophar): The uvula is midline, the palate elevates symmetrically.  ?CN 11 (spinal access): Normal sternocleidomastoid and trapezius strength.  ?CN 12 (Hypoglossal): The tongue is midline. No atrophy or fasciculations..  ? ?MOTOR:  Muscle Strength: 5/5RUE, 4/5LUE, 5/5RLE, 4/5LLE.  ? ?COORDINATION:   Intact finger-to-nose, no tremor.  ? ?SENSATION:   Intact to light touch all four extremities. ? ?  ? ? ?ED Results / Procedures / Treatments   ?Labs ?(all labs ordered are listed, but only abnormal results are displayed) ?Labs Reviewed  ?RESP PANEL BY RT-PCR (FLU A&B, COVID) ARPGX2  ?BASIC METABOLIC PANEL  ?CBC WITH DIFFERENTIAL/PLATELET  ?URINALYSIS, ROUTINE W REFLEX MICROSCOPIC  ?TROPONIN I (HIGH  SENSITIVITY)  ?TROPONIN I (HIGH SENSITIVITY)  ? ? ?EKG ?EKG Interpretation ? ?Date/Time:  Monday March 08 2022 10:53:22 EDT ?Ventricular Rate:  78 ?PR Interval:  146 ?QRS Duration: 72 ?QT Interval:  398 ?QTC Calculation: 453 ?R Axis:   102 ?Text Interpretation: Normal sinus rhythm Rightward axis Low voltage QRS Cannot rule out Anterior infarct , age undetermined Abnormal ECG When compared with ECG of 11-Jan-2022 15:51, PREVIOUS ECG IS PRESENT Confirmed by Regan Lemming ([id number]) on 03/08/2022 12:38:46 PM ? ?Radiology ?DG Chest 1 View ? ?Result Date: 03/08/2022 ?CLINICAL DATA:  76 year old female with chest pain and headache. Recent cough and cold. Dialysis patient. EXAM: CHEST  1 VIEW COMPARISON:  Portable chest 07/25/2021 and earlier. FINDINGS: AP view at 1123 hours. Right chest dialysis catheter has been removed since last year. Evidence of prior thyroidectomy. Mild tortuosity of the thoracic aorta.  Other mediastinal contours are within normal limits. Visualized tracheal air column is within normal limits. Lung volumes and ventilation are within normal limits. No pneumothorax or pleural effusion. Chronic right breast/chest wall surgical clips. No acute osseous abnormality identified. Paucity of bowel gas. IMPRESSION: No acute cardiopulmonary abnormality. Electronically Signed   By: Genevie Ann M.D.   On: 03/08/2022 11:31  ? ?CT HEAD WO CONTRAST (5MM) ? ?Result Date: 03/08/2022 ?CLINICAL DATA:  Headache.  History of stroke EXAM: CT HEAD WITHOUT CONTRAST TECHNIQUE: Contiguous axial images were obtained from the base of the skull through the vertex without intravenous contrast. RADIATION DOSE REDUCTION: This exam was performed according to the departmental dose-optimization program which includes automated exposure control, adjustment of the mA and/or kV according to patient size and/or use of iterative reconstruction technique. COMPARISON:  01/11/2022 FINDINGS: Brain: Multifocal areas of encephalomalacia from previous infarcts  including within the right MCA territory, right occipital lobe, and bilateral cerebellar hemispheres. No new large territory acute infarction. No intracranial hemorrhage. Extensive low-density changes within the perive

## 2022-03-08 NOTE — ED Provider Triage Note (Signed)
Emergency Medicine Provider Triage Evaluation Note ? ?Kirsten Mcmahon , a 76 y.o. female with AKI, CVA, T2 DM, ESRD on dialysis was evaluated in triage.  Pt complains of chest pain described as aching that started 2 weeks ago.  RN reports right-sided chest pain, although patient was pointing to left side during my exam.  She also endorses headache.  She also notes a cold and flu symptoms for about a month.  She has nonproductive cough, runny nose, sneezing and congestion. ?Review of Systems  ?Positive: Chest pain, headache, cough, congestion,  ?Negative: Abd pain, n/v/d ? ?Physical Exam  ?BP 99/74 (BP Location: Right Arm)   Pulse 81   Temp 98.5 ?F (36.9 ?C) (Oral)   Resp 16   SpO2 98%  ?Gen:   Awake, no distress   ?Resp:  Normal effort  ?MSK:   Moves extremities without difficulty  ?Other:   ? ?Medical Decision Making  ?Medically screening exam initiated at 11:16 AM.  Appropriate orders placed.  Kirsten Mcmahon was informed that the remainder of the evaluation will be completed by another provider, this initial triage assessment does not replace that evaluation, and the importance of remaining in the ED until their evaluation is complete. ? ? ?  ?Tonye Pearson, [place] ?03/08/22 1121 ? ?

## 2022-03-08 NOTE — ED Notes (Signed)
MD aware of IV situation. MD will attempt IV.  ?

## 2022-03-08 NOTE — ED Provider Notes (Signed)
Care of patient assumed from Dr. Kellie Simmering at 3 PM.  This patient has a history of stroke with residual deficits and ESRD.  She was due for hemodialysis today.  She presents for multiple complaints including chest pain, cough, shortness of breath for the past week.  CT of chest was negative for acute findings.  Currently awaiting lab work. ?Physical Exam  ?BP 120/83   Pulse 77   Temp 98.5 ?F (36.9 ?C) (Oral)   Resp 17   SpO2 98%  ? ?Physical Exam ?Constitutional:   ?   General: She is not in acute distress. ?   Appearance: She is well-developed. She is not ill-appearing, toxic-appearing or diaphoretic.  ?HENT:  ?   Head: Normocephalic and atraumatic.  ?Cardiovascular:  ?   Rate and Rhythm: Normal rate and regular rhythm.  ?Pulmonary:  ?   Effort: Pulmonary effort is normal. No tachypnea or respiratory distress.  ?   Breath sounds: Normal breath sounds.  ?Abdominal:  ?   Palpations: Abdomen is soft.  ?Musculoskeletal:  ?   Cervical back: Normal range of motion.  ?Skin: ?   General: Skin is warm and dry.  ?Neurological:  ?   Mental Status: She is alert and oriented to person, place, and time.  ?Psychiatric:     ?   Mood and Affect: Mood normal.     ?   Behavior: Behavior normal.  ? ? ?Procedures  ?Procedures ? ?ED Course / MDM  ?  ?Medical Decision Making ?Amount and/or Complexity of Data Reviewed ?Labs: ordered. ?Radiology: ordered. ? ?Risk ?OTC drugs. ? ? ?On assessment, patient is resting comfortably.  Her only complaint at this time is a generalized headache.  Headache cocktail was ordered.  Unfortunately, patient does not have IV access.  Oral medication ordered.  Attempted to reach out to nephrology to arrange for possible dialysis make-up session.  On reassessment, patient's daughter is at bedside.  Patient and daughter request discharge home at this time.  Patient will plan on resumption of dialysis on Wednesday.  She does not wish to wait for discussion with nephrology for possible make-up session.  Given  her unlabored breathing and reassuring lab work, patient was discharged in stable condition. ? ? ? ? ?  ?Godfrey Pick, MD ?03/09/22 1326 ? ?

## 2022-03-08 NOTE — ED Notes (Signed)
Ultrasound RN came to look at pt and IV team...both unsuccessful  ?

## 2022-03-08 NOTE — ED Triage Notes (Addendum)
Pt reports having right side chest pain and headache for over a week. States she had recent cough and cold symptoms prior to that. No acute distress noted at this time. Dialysis pt- last treatment was thursday ?

## 2022-06-23 ENCOUNTER — Emergency Department (HOSPITAL_COMMUNITY)
Admission: EM | Admit: 2022-06-23 | Discharge: 2022-06-23 | Disposition: A | Discharge status: Home / Self Care | Payer: Medicare (Managed Care) | Attending: Student | Admitting: Student

## 2022-06-23 ENCOUNTER — Emergency Department (HOSPITAL_COMMUNITY): Payer: Medicare (Managed Care)

## 2022-06-23 ENCOUNTER — Encounter (HOSPITAL_COMMUNITY): Payer: Self-pay | Admitting: Emergency Medicine

## 2022-06-23 DIAGNOSIS — Z5321 Procedure and treatment not carried out due to patient leaving prior to being seen by health care provider: Secondary | ICD-10-CM | POA: Diagnosis not present

## 2022-06-23 DIAGNOSIS — R109 Unspecified abdominal pain: Secondary | ICD-10-CM | POA: Diagnosis present

## 2022-06-23 DIAGNOSIS — R519 Headache, unspecified: Secondary | ICD-10-CM | POA: Diagnosis not present

## 2022-06-23 DIAGNOSIS — R110 Nausea: Secondary | ICD-10-CM | POA: Diagnosis not present

## 2022-06-23 NOTE — ED Triage Notes (Signed)
Patient here with complaint of abdominal pain and headache that started this morning. Reports nausea. Patient is alert, oriented, and in no apparent distress at this time.

## 2022-06-23 NOTE — ED Provider Triage Note (Signed)
Emergency Medicine Provider Triage Evaluation Note  Kirsten Mcmahon , a 76 y.o. female  was evaluated in triage.  Pt complains of abdominal pain since this morning.  Associated with multiple episodes of emesis.  Last bowel movement yesterday, she is unsure if she is passing gas.  No chest pain, does not feel short of breath.  Review of Systems  Per HPI  Physical Exam  BP 138/83 (BP Location: Left Arm)   Pulse 91   Temp 98.5 F (36.9 C) (Oral)   Resp 19   SpO2 98%  Gen:   Awake, no distress   Resp:  Normal effort  MSK:   Moves extremities without difficulty  Other:  Periumbilical abdominal tenderness with guarding  Medical Decision Making  Medically screening exam initiated at 11:15 AM.  Appropriate orders placed.  [name] was informed that the remainder of the evaluation will be completed by another provider, this initial triage assessment does not replace that evaluation, and the importance of remaining in the ED until their evaluation is complete.  Contrast allergy, will proceed with CT abdomen pelvis without contrast   Sherrill Raring, PA-C 06/23/22 1116

## 2022-06-23 NOTE — ED Notes (Signed)
Kourtnie Sachs son [phone] wants to give information about patient condition

## 2022-06-23 NOTE — ED Notes (Signed)
Attempted once for blood draw, unsuccessful.

## 2022-06-23 NOTE — ED Notes (Signed)
Patient left without being seen.

## 2022-10-04 ENCOUNTER — Encounter (INDEPENDENT_AMBULATORY_CARE_PROVIDER_SITE_OTHER): Payer: Self-pay

## 2022-12-27 ENCOUNTER — Emergency Department (HOSPITAL_COMMUNITY): Payer: Medicare (Managed Care)

## 2022-12-27 ENCOUNTER — Other Ambulatory Visit: Payer: Self-pay

## 2022-12-27 ENCOUNTER — Emergency Department (HOSPITAL_COMMUNITY)
Admission: EM | Admit: 2022-12-27 | Discharge: 2022-12-27 | Disposition: A | Discharge status: Home / Self Care | Payer: Medicare (Managed Care) | Attending: Emergency Medicine | Admitting: Emergency Medicine

## 2022-12-27 DIAGNOSIS — Z853 Personal history of malignant neoplasm of breast: Secondary | ICD-10-CM | POA: Diagnosis not present

## 2022-12-27 DIAGNOSIS — U071 COVID-19: Secondary | ICD-10-CM | POA: Insufficient documentation

## 2022-12-27 DIAGNOSIS — R569 Unspecified convulsions: Secondary | ICD-10-CM | POA: Diagnosis present

## 2022-12-27 DIAGNOSIS — Z992 Dependence on renal dialysis: Secondary | ICD-10-CM | POA: Diagnosis not present

## 2022-12-27 DIAGNOSIS — E1122 Type 2 diabetes mellitus with diabetic chronic kidney disease: Secondary | ICD-10-CM | POA: Diagnosis not present

## 2022-12-27 DIAGNOSIS — N186 End stage renal disease: Secondary | ICD-10-CM | POA: Insufficient documentation

## 2022-12-27 DIAGNOSIS — Z79899 Other long term (current) drug therapy: Secondary | ICD-10-CM | POA: Insufficient documentation

## 2022-12-27 DIAGNOSIS — Z7982 Long term (current) use of aspirin: Secondary | ICD-10-CM | POA: Diagnosis not present

## 2022-12-27 DIAGNOSIS — T17908A Unspecified foreign body in respiratory tract, part unspecified causing other injury, initial encounter: Secondary | ICD-10-CM

## 2022-12-27 DIAGNOSIS — I120 Hypertensive chronic kidney disease with stage 5 chronic kidney disease or end stage renal disease: Secondary | ICD-10-CM | POA: Insufficient documentation

## 2022-12-27 DIAGNOSIS — E039 Hypothyroidism, unspecified: Secondary | ICD-10-CM | POA: Insufficient documentation

## 2022-12-27 DIAGNOSIS — Z91040 Latex allergy status: Secondary | ICD-10-CM | POA: Diagnosis not present

## 2022-12-27 LAB — CBC WITH DIFFERENTIAL/PLATELET
Abs Immature Granulocytes: 0.05 10*3/uL (ref 0.00–0.07)
Basophils Absolute: 0 10*3/uL (ref 0.0–0.1)
Basophils Relative: 1 %
Eosinophils Absolute: 0.1 10*3/uL (ref 0.0–0.5)
Eosinophils Relative: 2 %
HCT: 33.5 % — ABNORMAL LOW (ref 36.0–46.0)
Hemoglobin: 10.1 g/dL — ABNORMAL LOW (ref 12.0–15.0)
Immature Granulocytes: 1 %
Lymphocytes Relative: 18 %
Lymphs Abs: 1.1 10*3/uL (ref 0.7–4.0)
MCH: 31.8 pg (ref 26.0–34.0)
MCHC: 30.1 g/dL (ref 30.0–36.0)
MCV: 105.3 fL — ABNORMAL HIGH (ref 80.0–100.0)
Monocytes Absolute: 0.4 10*3/uL (ref 0.1–1.0)
Monocytes Relative: 7 %
Neutro Abs: 4.4 10*3/uL (ref 1.7–7.7)
Neutrophils Relative %: 71 %
Platelets: 191 10*3/uL (ref 150–400)
RBC: 3.18 MIL/uL — ABNORMAL LOW (ref 3.87–5.11)
RDW: 14 % (ref 11.5–15.5)
WBC: 6.1 10*3/uL (ref 4.0–10.5)
nRBC: 0 % (ref 0.0–0.2)

## 2022-12-27 LAB — RESP PANEL BY RT-PCR (RSV, FLU A&B, COVID)  RVPGX2
Influenza A by PCR: NEGATIVE
Influenza B by PCR: NEGATIVE
Resp Syncytial Virus by PCR: NEGATIVE
SARS Coronavirus 2 by RT PCR: POSITIVE — AB

## 2022-12-27 LAB — COMPREHENSIVE METABOLIC PANEL
ALT: 15 U/L (ref 0–44)
AST: 25 U/L (ref 15–41)
Albumin: 3.2 g/dL — ABNORMAL LOW (ref 3.5–5.0)
Alkaline Phosphatase: 67 U/L (ref 38–126)
Anion gap: 17 — ABNORMAL HIGH (ref 5–15)
BUN: 21 mg/dL (ref 8–23)
CO2: 26 mmol/L (ref 22–32)
Calcium: 8.8 mg/dL — ABNORMAL LOW (ref 8.9–10.3)
Chloride: 96 mmol/L — ABNORMAL LOW (ref 98–111)
Creatinine, Ser: 5.62 mg/dL — ABNORMAL HIGH (ref 0.44–1.00)
GFR, Estimated: 7 mL/min — ABNORMAL LOW (ref >60)
Glucose, Bld: 124 mg/dL — ABNORMAL HIGH (ref 70–99)
Potassium: 3.4 mmol/L — ABNORMAL LOW (ref 3.5–5.1)
Sodium: 139 mmol/L (ref 135–145)
Total Bilirubin: 0.5 mg/dL (ref 0.3–1.2)
Total Protein: 7 g/dL (ref 6.5–8.1)

## 2022-12-27 LAB — CBG MONITORING, ED: Glucose-Capillary: 112 mg/dL — ABNORMAL HIGH (ref 70–99)

## 2022-12-27 MED ORDER — AZITHROMYCIN 250 MG PO TABS: 250.0000 mg | ORAL_TABLET | Freq: Every day | ORAL | 0 refills | Status: DC

## 2022-12-27 MED ORDER — AMOXICILLIN-POT CLAVULANATE 875-125 MG PO TABS: 1.0000 | ORAL_TABLET | Freq: Two times a day (BID) | ORAL | 0 refills | Status: DC

## 2022-12-27 NOTE — ED Triage Notes (Signed)
PT BIBGEMS from dialysis post witnessed seizure lasting 10-15 seconds. Alert upon arrival Initial GCS 9.   Hx seizures and dementia  Left sided deficits from previous stroke  BP 127/65 Hr 90 O2 98% ra CBG 130  Total 3hrs treatment

## 2022-12-27 NOTE — ED Provider Notes (Signed)
[facility] Provider Note  CSN: [id number] Arrival date & time: 12/27/22 1435  Chief Complaint(s) Seizures  HPI Kirsten Mcmahon is a 77 y.o. female with past medical history as below, significant for ESRD on HD MWF, seizure on vimpat, dementia, CVA with left sided residual deficit who presents to the ED with complaint of seizure.  Patient accompanied by son who provides most of history.  She is on Vimpat with good compliance.  She had URI symptoms last week and missed her Friday dialysis session.  She is in dialysis today after around 3 hours she had a seizure there is witnessed by dialysis staff and sent to the ED for evaluation.  Seizure resolved spontaneously.  Patient is essentially back to baseline per family at bedside.  Patient has no acute complaints this time but is asking for something to eat.  Denies any nausea, vomiting, fevers, chills, chest pain or dyspnea, no change in urination, she still makes a little bit of urine.  Denies sick contacts or recent travel.    History provided by son at bedside  Seizure at dialysis was less than 30 seconds per son, resolved spontaneously   Past Medical History Past Medical History:  Diagnosis Date   Cancer ([place]) 2017   Right breast   Chronic kidney disease    progression to ESRD 04/10/21   GERD (gastroesophageal reflux disease)    Glaucoma    Hemiparesis (HCC)    left side   High cholesterol    History of seizure    after a spider bite; 07/15/21   History of stroke with residual deficit    left-side weakness   Hypertension    states BP under control with meds., has been on med. x 2 yr.   Hypothyroidism    Non-insulin dependent type 2 diabetes mellitus (HCC)    Overactive bladder    PFO (patent foramen ovale) 05/15/2021   Stroke ([place])    1998 weakness on left side; 05/12/21   Patient Active Problem List   Diagnosis Date Noted   Delirium due to known physiological condition 08/03/2021    Seizure ([place]) 07/17/2021   Mixed diabetic hyperlipidemia associated with type 2 diabetes mellitus ([place]) 07/16/2021   Breakthrough seizure ([place]) 07/15/2021   Encounter for immunization 07/14/2021   Pain, unspecified 07/13/2021   Hypokalemia 06/02/2021   Other pneumonia, unspecified organism 05/26/2021   Recent cerebrovascular accident (CVA) 05/17/2021   History of CVA with residual deficit 05/17/2021   Cerebral embolism with cerebral infarction 05/13/2021   Physical deconditioning 04/30/2021   Generalized muscle weakness 04/30/2021   Pressure injury of skin 04/29/2021   AKI (acute kidney injury) ([place]) 04/28/2021   Moderate protein-calorie malnutrition ([place]) 04/28/2021   Other bacterial infections of unspecified site 04/28/2021   Streptococcal sepsis, unspecified ([place]) 04/18/2021   Streptococcal bacteremia 04/18/2021   Leukocytosis 04/17/2021   Fever 04/17/2021   Allergy, unspecified, initial encounter 04/16/2021   Anaphylactic shock, unspecified, initial encounter 26/94/8546   Complication of vascular dialysis catheter 04/16/2021   Iron deficiency anemia, unspecified 04/16/2021   Pruritus, unspecified 04/16/2021   Secondary hyperparathyroidism of renal origin ([place]) 04/16/2021   Type 2 diabetes mellitus with diabetic peripheral angiopathy without gangrene ([place]) 04/16/2021   End-stage renal disease on hemodialysis ([place])    Acute kidney injury superimposed on CKD ([place]) 04/09/2021   ARF (acute renal failure) ([place]) 01/18/2021   Fall 01/17/2021   Essential hypertension    Hypothyroidism    Stroke ([place])  GERD (gastroesophageal reflux disease)    Anemia in chronic kidney disease (CKD)    Acute metabolic encephalopathy    Acute renal failure superimposed on stage 3b chronic kidney disease (HCC)    Type 2 diabetes mellitus with ESRD (end-stage renal disease) (HCC)    Abnormal mammogram of left breast 07/30/2018   Closed displaced oblique fracture of shaft of left humerus 03/14/2018    Osteopenia 01/20/2018   Multiple thyroid nodules 07/13/2017   Tracheal deviation 07/13/2017   Malignant neoplasm of upper-outer quadrant of right breast in female, estrogen receptor positive ([place]) 01/20/2017   Primary vulvar squamous cell carcinoma ([place]) 01/20/2017   Home Medication(s) Prior to Admission medications   Medication Sig Start Date End Date Taking? Authorizing Provider  amoxicillin-clavulanate (AUGMENTIN) 875-125 MG tablet Take 1 tablet by mouth every 12 (twelve) hours. 12/27/22  Yes Wynona Dove A, DO  azithromycin (ZITHROMAX) 250 MG tablet Take 1 tablet (250 mg total) by mouth daily. Take first 2 tablets together, then 1 every day until finished. 12/27/22  Yes Jeanell Sparrow, DO  aspirin 81 MG chewable tablet Chew 1 tablet (81 mg total) by mouth daily. 08/01/21   Lavina Hamman, MD  B Complex-C-Zn-Folic Acid (DIALYVITE 654 WITH ZINC) 0.8 MG TABS Take 1 tablet by mouth daily. 01/13/22   [provider]  calcitRIOL (ROCALTROL) 0.5 MCG capsule Take 1 capsule (0.5 mcg total) by mouth every Monday, Wednesday, and Friday with hemodialysis. 07/31/21   Lavina Hamman, MD  dextrose (GLUTOSE) 40 % GEL Place 28 mLs inside cheek as needed (for suspected low sugars). 07/31/21   Lavina Hamman, MD  lacosamide 100 MG TABS Take 1 tablet (100 mg total) by mouth 2 (two) times daily. 07/31/21   Lavina Hamman, MD  latanoprost (XALATAN) 0.005 % ophthalmic solution Place 1 drop into both eyes at bedtime. 09/07/18   Coral Spikes, DO  letrozole [facility]) 2.5 MG tablet Take 1 tablet (2.5 mg total) by mouth daily. 02/16/19   Nicholas Lose, MD  levothyroxine (SYNTHROID, LEVOTHROID) 112 MCG tablet Take 1 tablet (112 mcg total) by mouth daily before breakfast. 09/07/18   Coral Spikes, DO  lidocaine-prilocaine (EMLA) cream Apply 1 application. topically 3 (three) times a week. 02/04/22   [provider]  metoprolol succinate (TOPROL-XL) 50 MG 24 hr tablet Take 50 mg by mouth daily. 08/20/20   [provider]  multivitamin (RENA-VIT) TABS tablet Take 1 tablet by mouth at bedtime. Patient not taking: Reported on 03/08/2022 07/31/21   Lavina Hamman, MD  Nutritional Supplements (,FEEDING SUPPLEMENT, PROSOURCE PLUS) liquid Take 30 mLs by mouth 3 (three) times daily between meals. 07/31/21   Lavina Hamman, MD  Nutritional Supplements (FEEDING SUPPLEMENT, BOOST BREEZE,) LIQD Take 1 each by mouth 3 (three) times daily. 07/31/21   Lavina Hamman, MD  sucroferric oxyhydroxide (VELPHORO) 500 MG chewable tablet Chew 500 mg by mouth 3 (three) times daily with meals. 10/13/21   [provider]  Past Surgical History Past Surgical History:  Procedure Laterality Date   ABDOMINAL HYSTERECTOMY     complete   AV FISTULA PLACEMENT Right 09/29/2021   Procedure: INSERTION OF RIGHT ARM ARTERIOVENOUS (AV) GORE-TEX GRAFT;  Surgeon: Angelia Mould, MD;  Location: [facility];  Service: Vascular;  Laterality: Right;   BREAST BIOPSY Left 08/03/2018   Benign adipose tissue   BREAST EXCISIONAL BIOPSY Right 2014   Positive   BREAST LUMPECTOMY Right    BUBBLE STUDY  05/15/2021   Procedure: BUBBLE STUDY;  Surgeon: Jerline Pain, MD;  Location: [facility];  Service: Cardiovascular;;   CATARACT EXTRACTION W/ INTRAOCULAR LENS IMPLANT Left    CEREBRAL ANEURYSM REPAIR  1998   DIALYSIS/PERMA CATHETER INSERTION N/A 04/10/2021   Procedure: DIALYSIS/PERMA CATHETER INSERTION;  Surgeon: Algernon Huxley, MD;  Location: [facility];  Service: Cardiovascular;  Laterality: N/A;   IR FLUORO GUIDE CV LINE RIGHT  04/30/2021   PICC LINE INSERTION     TEE WITHOUT CARDIOVERSION N/A 05/15/2021   Procedure: TRANSESOPHAGEAL ECHOCARDIOGRAM (TEE);  Surgeon: Jerline Pain, MD;  Location: [facility] ENDOSCOPY;  Service: Cardiovascular;  Laterality: N/A;   THYROID LOBECTOMY Right 07/15/2017   Procedure:  RIGHT THYROID LOBECTOMY;  Surgeon: Armandina Gemma, MD;  Location: [facility] OR;  Service: General;  Laterality: Right;   Family History Family History  Problem Relation Age of Onset   Cancer Brother        possible prostate cancer per her daughter   Breast cancer Neg Hx     Social History Social History   Tobacco Use   Smoking status: Never   Smokeless tobacco: Never  Vaping Use   Vaping Use: Never used  Substance Use Topics   Alcohol use: No   Drug use: No   Allergies Contrast media [iodinated contrast media], Latex, and Levemir [insulin detemir]  Review of Systems Review of Systems  Unable to perform ROS: Dementia  Constitutional:  Negative for chills and fever.  Respiratory:  Negative for cough and shortness of breath.   Gastrointestinal:  Negative for abdominal pain, nausea and vomiting.  Genitourinary:  Negative for difficulty urinating and dysuria.    Physical Exam Vital Signs  I have reviewed the triage vital signs BP 118/63   Pulse 72   Temp 98.3 F (36.8 C) (Oral)   Resp 18   SpO2 100%  Physical Exam Vitals and nursing note reviewed.  Constitutional:      General: She is not in acute distress.    Appearance: Normal appearance.  HENT:     Head: Normocephalic and atraumatic. No raccoon eyes, Battle's sign, right periorbital erythema or left periorbital erythema.     Jaw: There is normal jaw occlusion.     Comments: No head injury, no tongue laceration    Right Ear: External ear normal.     Left Ear: External ear normal.     Nose: Nose normal.     Mouth/Throat:     Mouth: Mucous membranes are moist.  Eyes:     General: No scleral icterus.       Right eye: No discharge.        Left eye: No discharge.  Cardiovascular:     Rate and Rhythm: Normal rate and regular rhythm.     Pulses: Normal pulses.     Heart sounds: Normal heart sounds.  Pulmonary:     Effort: Pulmonary effort is normal. No respiratory distress.     Breath sounds: Normal breath sounds.  Abdominal:     General: Abdomen is flat.     Tenderness: There is no abdominal tenderness.  Musculoskeletal:        General: Normal range of motion.     Cervical back: Normal range of motion.     Right lower leg: No edema.     Left lower leg: No edema.  Skin:    General: Skin is warm and dry.     Capillary Refill: Capillary refill takes less than 2 seconds.       Neurological:     Mental Status: She is alert. Mental status is at baseline.     GCS: GCS eye subscore is 4. GCS verbal subscore is 5. GCS motor subscore is 6.     Comments: Left-sided residual deficit appreciated AO x 2 with provocation   Psychiatric:        Mood and Affect: Mood normal.        Behavior: Behavior normal.     ED Results and Treatments Labs (all labs ordered are listed, but only abnormal results are displayed) Labs Reviewed  COMPREHENSIVE METABOLIC PANEL - Abnormal; Notable for the following components:      Result Value   Potassium 3.4 (*)    Chloride 96 (*)    Glucose, Bld 124 (*)    Creatinine, Ser 5.62 (*)    Calcium 8.8 (*)    Albumin 3.2 (*)    GFR, Estimated 7 (*)    Anion gap 17 (*)    All other components within normal limits  CBC WITH DIFFERENTIAL/PLATELET - Abnormal; Notable for the following components:   RBC 3.18 (*)    Hemoglobin 10.1 (*)    HCT 33.5 (*)    MCV 105.3 (*)    All other components within normal limits  CBG MONITORING, ED - Abnormal; Notable for the following components:   Glucose-Capillary 112 (*)    All other components within normal limits  RESP PANEL BY RT-PCR (RSV, FLU A&B, COVID)  RVPGX2  LACOSAMIDE                                                                                                                          Radiology DG Chest Portable 1 View  Result Date: 12/27/2022 CLINICAL DATA:  Witnessed seizure EXAM: PORTABLE CHEST 1 VIEW COMPARISON:  Chest radiograph dated 03/08/2022 FINDINGS: Patient is rotated slightly to the right. Low lung volumes.  Bibasilar patchy opacities. No pleural effusion or pneumothorax. Similar enlarged cardiomediastinal silhouette. The visualized skeletal structures are unremarkable. Surgical clips project over the right cervical region and right breast. IMPRESSION: Low lung volumes with bibasilar patchy opacities, likely atelectasis. Aspiration or pneumonia can be considered in the appropriate clinical setting. Electronically Signed   By: Darrin Nipper M.D.   On: 12/27/2022 16:58    Pertinent labs & imaging results that were available during my care of the patient were reviewed by me and considered in my medical decision making (see MDM for  details).  Medications Ordered in ED Medications - No data to display                                                                                                                                   Procedures Procedures  (including critical care time)  Medical Decision Making / ED Course   MDM:  Kirsten Mcmahon is a 77 y.o. female with past medical history as below, significant for ESRD on HD MWF, seizure on vimpat, dementia, CVA with left sided residual deficit who presents to the ED with complaint of seizure.  Patient accompanied by son who provides most of history.. The complaint involves an extensive differential diagnosis and also carries with it a high risk of complications and morbidity.  Serious etiology was considered. Ddx includes but is not limited to: Differential diagnoses for altered mental status includes but is not exclusive to alcohol, illicit or prescription medications, intracranial pathology such as stroke, intracerebral hemorrhage, fever or infectious causes including sepsis, hypoxemia, uremia, trauma, endocrine related disorders such as diabetes, hypoglycemia, thyroid-related diseases, etc.   On initial assessment the patient is: Neuro nonfocal, at baseline per family, HDS.  Asking for something to eat. Vital signs and nursing notes were reviewed     Labs reviewed, these are stable, similar to her baseline.  Vimpat level was ordered but is a send out. XR w/ possible aspiration pneumonitis/versus pneumonia on x-ray, fever likely aspiration pneumonitis given patient's comorbidities will cover with antibiotics. Not septic, breathing comfortably on ambient air. Family will f/u on covid / flu test on mychart   Advised to continue taking her regular medications including Vimpat and follow-up with seizure specialist  Seizure Precautions discussed  Discharged with son/caretaker  No seizure activity while in ED, was under observation for >6 hours.   The patient improved significantly and was discharged in stable condition. Detailed discussions were had with the patient regarding current findings, and need for close f/u with PCP or on call doctor. The patient has been instructed to return immediately if the symptoms worsen in any way for re-evaluation. Patient verbalized understanding and is in agreement with current care plan. All questions answered prior to discharge.    Additional history obtained: -Additional history obtained from family -External records from outside source obtained and reviewed including: Chart review including previous notes, labs, imaging, consultation notes including prior ED visits, prior labs and imaging, home medications   Lab Tests: -I ordered, reviewed, and interpreted labs.   The pertinent results include:   Labs Reviewed  COMPREHENSIVE METABOLIC PANEL - Abnormal; Notable for the following components:      Result Value   Potassium 3.4 (*)    Chloride 96 (*)    Glucose, Bld 124 (*)    Creatinine, Ser 5.62 (*)    Calcium 8.8 (*)    Albumin 3.2 (*)    GFR, Estimated 7 (*)    Anion gap  17 (*)    All other components within normal limits  CBC WITH DIFFERENTIAL/PLATELET - Abnormal; Notable for the following components:   RBC 3.18 (*)    Hemoglobin 10.1 (*)    HCT 33.5 (*)    MCV 105.3 (*)    All other  components within normal limits  CBG MONITORING, ED - Abnormal; Notable for the following components:   Glucose-Capillary 112 (*)    All other components within normal limits  RESP PANEL BY RT-PCR (RSV, FLU A&B, COVID)  RVPGX2  LACOSAMIDE    Notable for as above  EKG   EKG Interpretation  Date/Time:  Monday December 27 2022 14:42:41 EST Ventricular Rate:  90 PR Interval:  121 QRS Duration: 92 QT Interval:  345 QTC Calculation: 423 R Axis:   -22 Text Interpretation: Sinus rhythm Inferior infarct, old no stemi similar to prior Confirmed by Wynona Dove ([id number]) on 12/27/2022 8:43:31 PM         Imaging Studies ordered: I ordered imaging studies including cxr I independently visualized the following imaging with scope of interpretation limited to determining acute life threatening conditions related to emergency care: as above I independently visualized and interpreted imaging. I agree with the radiologist interpretation   Medicines ordered and prescription drug management: Meds ordered this encounter  Medications   amoxicillin-clavulanate (AUGMENTIN) 875-125 MG tablet    Sig: Take 1 tablet by mouth every 12 (twelve) hours.    Dispense:  14 tablet    Refill:  0   azithromycin (ZITHROMAX) 250 MG tablet    Sig: Take 1 tablet (250 mg total) by mouth daily. Take first 2 tablets together, then 1 every day until finished.    Dispense:  6 tablet    Refill:  0    -I have reviewed the patients home medicines and have made adjustments as needed   Consultations Obtained: na   Cardiac Monitoring: The patient was maintained on a cardiac monitor.  I personally viewed and interpreted the cardiac monitored which showed an underlying rhythm of: nsr  Social Determinants of Health:  Diagnosis or treatment significantly limited by social determinants of health: lives with son, non smoker    Reevaluation: After the interventions noted above, I reevaluated the patient and found that  they have resolved  Co morbidities that complicate the patient evaluation  Past Medical History:  Diagnosis Date   Cancer ([place]) 2017   Right breast   Chronic kidney disease    progression to ESRD 04/10/21   GERD (gastroesophageal reflux disease)    Glaucoma    Hemiparesis ([place])    left side   High cholesterol    History of seizure    after a spider bite; 07/15/21   History of stroke with residual deficit    left-side weakness   Hypertension    states BP under control with meds., has been on med. x 2 yr.   Hypothyroidism    Non-insulin dependent type 2 diabetes mellitus (HCC)    Overactive bladder    PFO (patent foramen ovale) 05/15/2021   Stroke ([place])    1998 weakness on left side; 05/12/21      Dispostion: Disposition decision including need for hospitalization was considered, and patient discharged from emergency department.    Final Clinical Impression(s) / ED Diagnoses Final diagnoses:  Seizure ([place])  ESRD on hemodialysis ([place])  Aspiration into respiratory tract, initial encounter     This chart was dictated using voice recognition software.  Despite best efforts to  proofread,  errors can occur which can change the documentation meaning.    Jeanell Sparrow, DO 12/27/22 2045

## 2022-12-27 NOTE — Discharge Instructions (Addendum)
It was a pleasure caring for you today in the emergency department.  Please return to the emergency department for any worsening or worrisome symptoms.  You may have some aspiration pneumonitis from the seizure earlier today, will give the empiric antibiotics just in case.  It should resolve  Per [facility] statutes, patients with seizures are not allowed to drive until they have been seizure-free for six months.  Other recommendations include using caution when using heavy equipment or power tools. Avoid working on ladders or at heights. Take showers instead of baths.  Do not swim alone.  Ensure the water temperature is not too high on the home water heater. Do not go swimming alone. Do not lock yourself in a room alone (i.e. bathroom). When caring for infants or small children, sit down when holding, feeding, or changing them to minimize risk of injury to the child in the event you have a seizure. Maintain good sleep hygiene. Avoid alcohol.  Also recommend adequate sleep, hydration, good diet and minimize stress.     During the Seizure   - First, ensure adequate ventilation and place patients on the floor on their left side  Loosen clothing around the neck and ensure the airway is patent. If the patient is clenching the teeth, do not force the mouth open with any object as this can cause severe damage - Remove all items from the surrounding that can be hazardous. The patient may be oblivious to what's happening and may not even know what he or she is doing. If the patient is confused and wandering, either gently guide him/her away and block access to outside areas - Reassure the individual and be comforting - Call 911. In most cases, the seizure ends before EMS arrives. However, there are cases when seizures may last over 3 to 5 minutes. Or the individual may have developed breathing difficulties or severe injuries. If a pregnant patient or a person with diabetes develops a seizure, it is  prudent to call an ambulance. - Finally, if the patient does not regain full consciousness, then call EMS. Most patients will remain confused for about 45 to 90 minutes after a seizure, so you must use judgment in calling for help. - Avoid restraints but make sure the patient is in a bed with padded side rails - Place the individual in a lateral position with the neck slightly flexed; this will help the saliva drain from the mouth and prevent the tongue from falling backward - Remove all nearby furniture and other hazards from the area - Provide verbal assurance as the individual is regaining consciousness - Provide the patient with privacy if possible - Call for help and start treatment as ordered by the caregiver   After the Seizure (Postictal Stage)   After a seizure, most patients experience confusion, fatigue, muscle pain and/or a headache. Thus, one should permit the individual to sleep. For the next few days, reassurance is essential. Being calm and helping reorient the person is also of importance.   Most seizures are painless and end spontaneously. Seizures are not harmful to others but can lead to complications such as stress on the lungs, brain and the heart. Individuals with prior lung problems may develop labored breathing and respiratory distress.

## 2022-12-27 NOTE — ED Notes (Signed)
Pt refusing covid swab

## 2022-12-27 NOTE — ED Notes (Signed)
Help get patient undressed into a gown on the monitor did EKG got patient some warm blankets patient is resting with nurse at bedside and call bell in reach

## 2022-12-27 NOTE — ED Notes (Signed)
Attempted covid swab, pt refused

## 2022-12-29 LAB — LACOSAMIDE: Lacosamide: <0.5 ug/mL — ABNORMAL LOW (ref 5.0–10.0)

## 2023-01-21 ENCOUNTER — Other Ambulatory Visit: Payer: Self-pay | Admitting: Internal Medicine

## 2023-01-21 DIAGNOSIS — N6459 Other signs and symptoms in breast: Secondary | ICD-10-CM

## 2023-02-03 ENCOUNTER — Ambulatory Visit
Admission: RE | Admit: 2023-02-03 | Discharge: 2023-02-03 | Disposition: A | Discharge status: Home / Self Care | Payer: Medicare (Managed Care) | Source: Ambulatory Visit | Attending: Internal Medicine | Admitting: Internal Medicine

## 2023-02-03 ENCOUNTER — Other Ambulatory Visit: Payer: Self-pay | Admitting: Internal Medicine

## 2023-02-03 ENCOUNTER — Ambulatory Visit
Admission: RE | Admit: 2023-02-03 | Discharge: 2023-02-03 | Disposition: A | Discharge status: Home / Self Care | Payer: PRIVATE HEALTH INSURANCE | Source: Ambulatory Visit | Attending: Internal Medicine | Admitting: Internal Medicine

## 2023-02-03 DIAGNOSIS — N6459 Other signs and symptoms in breast: Secondary | ICD-10-CM

## 2023-02-03 DIAGNOSIS — N6320 Unspecified lump in the left breast, unspecified quadrant: Secondary | ICD-10-CM

## 2023-02-03 DIAGNOSIS — N6310 Unspecified lump in the right breast, unspecified quadrant: Secondary | ICD-10-CM

## 2023-02-03 DIAGNOSIS — R599 Enlarged lymph nodes, unspecified: Secondary | ICD-10-CM

## 2023-02-07 ENCOUNTER — Telehealth: Payer: Self-pay | Admitting: Hematology and Oncology

## 2023-02-07 NOTE — Telephone Encounter (Signed)
Scheduled appt per 3/4 referral. Spoke to Janett Billow at [facility] who is aware of appt date/time.

## 2023-02-15 ENCOUNTER — Other Ambulatory Visit: Payer: Self-pay | Admitting: Internal Medicine

## 2023-02-15 ENCOUNTER — Ambulatory Visit
Admission: RE | Admit: 2023-02-15 | Discharge: 2023-02-15 | Disposition: A | Discharge status: Home / Self Care | Payer: Medicare (Managed Care) | Source: Ambulatory Visit | Attending: Internal Medicine | Admitting: Internal Medicine

## 2023-02-15 ENCOUNTER — Ambulatory Visit
Admission: RE | Admit: 2023-02-15 | Discharge: 2023-02-15 | Disposition: A | Discharge status: Home / Self Care | Payer: Medicare (Managed Care) | Source: Ambulatory Visit | Attending: Internal Medicine

## 2023-02-15 ENCOUNTER — Ambulatory Visit: Payer: Medicare (Managed Care)

## 2023-02-15 DIAGNOSIS — N6459 Other signs and symptoms in breast: Secondary | ICD-10-CM

## 2023-02-15 DIAGNOSIS — N6320 Unspecified lump in the left breast, unspecified quadrant: Secondary | ICD-10-CM

## 2023-02-15 DIAGNOSIS — N6310 Unspecified lump in the right breast, unspecified quadrant: Secondary | ICD-10-CM

## 2023-02-15 HISTORY — PX: BREAST BIOPSY: SHX20

## 2023-02-25 ENCOUNTER — Inpatient Hospital Stay: Payer: Medicare (Managed Care) | Attending: Hematology and Oncology | Admitting: Hematology and Oncology

## 2023-02-25 ENCOUNTER — Other Ambulatory Visit: Payer: Self-pay

## 2023-02-25 ENCOUNTER — Inpatient Hospital Stay: Payer: Medicare (Managed Care)

## 2023-02-25 VITALS — BP 144/70 | HR 79 | Temp 97.3°F | Resp 18 | Ht 62.0 in

## 2023-02-25 DIAGNOSIS — Z79899 Other long term (current) drug therapy: Secondary | ICD-10-CM | POA: Insufficient documentation

## 2023-02-25 DIAGNOSIS — R234 Changes in skin texture: Secondary | ICD-10-CM | POA: Insufficient documentation

## 2023-02-25 DIAGNOSIS — C519 Malignant neoplasm of vulva, unspecified: Secondary | ICD-10-CM | POA: Insufficient documentation

## 2023-02-25 DIAGNOSIS — N186 End stage renal disease: Secondary | ICD-10-CM | POA: Insufficient documentation

## 2023-02-25 DIAGNOSIS — Z90710 Acquired absence of both cervix and uterus: Secondary | ICD-10-CM | POA: Insufficient documentation

## 2023-02-25 DIAGNOSIS — K219 Gastro-esophageal reflux disease without esophagitis: Secondary | ICD-10-CM | POA: Insufficient documentation

## 2023-02-25 DIAGNOSIS — C50411 Malignant neoplasm of upper-outer quadrant of right female breast: Secondary | ICD-10-CM | POA: Insufficient documentation

## 2023-02-25 DIAGNOSIS — Z170 Estrogen receptor positive status [ER+]: Secondary | ICD-10-CM | POA: Insufficient documentation

## 2023-02-25 DIAGNOSIS — Z992 Dependence on renal dialysis: Secondary | ICD-10-CM | POA: Diagnosis not present

## 2023-02-25 DIAGNOSIS — N6489 Other specified disorders of breast: Secondary | ICD-10-CM | POA: Diagnosis not present

## 2023-02-25 DIAGNOSIS — Z91041 Radiographic dye allergy status: Secondary | ICD-10-CM | POA: Insufficient documentation

## 2023-02-25 DIAGNOSIS — Z8673 Personal history of transient ischemic attack (TIA), and cerebral infarction without residual deficits: Secondary | ICD-10-CM | POA: Insufficient documentation

## 2023-02-25 DIAGNOSIS — Z79811 Long term (current) use of aromatase inhibitors: Secondary | ICD-10-CM | POA: Insufficient documentation

## 2023-02-25 DIAGNOSIS — Z8042 Family history of malignant neoplasm of prostate: Secondary | ICD-10-CM | POA: Insufficient documentation

## 2023-02-25 DIAGNOSIS — I120 Hypertensive chronic kidney disease with stage 5 chronic kidney disease or end stage renal disease: Secondary | ICD-10-CM | POA: Diagnosis not present

## 2023-02-25 NOTE — Assessment & Plan Note (Signed)
02/15/23: Right breast nipple changes, new heterogeneous calcifications, new asymmetry right retroareolar region 2 cm area, right axilla: 1 abnormal lymph node, ill-defined hypoechoic area left breast 5.1 cm diffuse skin thickening left breast: Right breast biopsy: Grade 2 IDC ER 95%, PR 95%, Ki-67 5%, HER2 1+ negative; left breast biopsy: Benign  (07/14/2015: Right lumpectomy: IDC grade 2, 2.5 cm, 1/2 sentinel nodes positive, DCIS, LCIS, lateral inferior and deep margins positive for LCIS, T2 N1 M0 stage II a, radiation did not recommend adjuvant radiation ( at [facility]) followed by antiestrogen therapy with letrozole 08/21/2015  Pathology and radiology counseling: Discussed with the patient, the details of pathology including the type of breast cancer,the clinical staging, the significance of ER, PR and HER-2/neu receptors and the implications for treatment. After reviewing the pathology in detail, we proceeded to discuss the different treatment options between surgery, radiation, chemotherapy, antiestrogen therapies.  Treatment plan: Right mastectomy Followed by adjuvant antiestrogen therapy

## 2023-02-25 NOTE — Progress Notes (Signed)
[facility] NOTE  Patient Care Team: [facility] as PCP - General [name], [facility]  CHIEF COMPLAINTS/PURPOSE OF CONSULTATION:  Newly diagnosed breast cancer  HISTORY OF PRESENTING ILLNESS:  Kirsten Mcmahon 77 y.o. female is here because of recent diagnosis of right breast cancer.  Patient has profound disabilities and has difficulty with moving her arms.  She was noted to have right breast nipple changes and mammogram and ultrasound revealed right retroareolar mass 2 cm in size with abnormal lymph node in the axilla.  The left breast had a 5.1 cm diffuse skin thickening.  Left breast biopsy was benign but the right breast biopsy came back as grade 2 invasive ductal carcinoma that was ER/PR positive and HER2 negative with a Ki-67 of 5%.  I reviewed her records extensively and collaborated the history with the patient.  SUMMARY OF ONCOLOGIC HISTORY: Oncology History  Malignant neoplasm of upper-outer quadrant of right breast in female, estrogen receptor positive ([place])  03/14/2015 Initial Diagnosis   On workup for blood work cancer PET/CT showed right breast lesion, 11:00 position 2.6 cm diameter biopsy done 05/20/2015 IDC grade 2 ER 95% PR 80% HER-2 negative   07/14/2015 Surgery   Right lumpectomy: IDC grade 2, 2.5 cm, 1/2 sentinel nodes positive, DCIS, LCIS, lateral inferior and deep margins positive for LCIS, T2 N1 M0 stage II a, radiation did not recommend adjuvant radiation ( at [facility])   08/21/2015 -  Anti-estrogen oral therapy   Letrozole 2.5 mg daily   02/15/2023 Relapse/Recurrence   Right breast nipple changes, new heterogeneous calcifications, new asymmetry right retroareolar region 2 cm area, right axilla: 1 abnormal lymph node, ill-defined hypoechoic area left breast 5.1 cm diffuse skin thickening left breast: Right breast biopsy: Grade 2 IDC ER 95%, PR 95%, Ki-67 5%, HER2 1+ negative; left breast biopsy: Benign    Primary vulvar squamous cell carcinoma ([place])  03/25/2015 Surgery   Vulvectomy by Dr. [name] at [facility]; invasive squamous cell carcinoma margins are negative, did not require any further treatment      MEDICAL HISTORY:  Past Medical History:  Diagnosis Date   Cancer ([place]) 2017   Right breast   Chronic kidney disease    progression to ESRD 04/10/21   GERD (gastroesophageal reflux disease)    Glaucoma    Hemiparesis (HCC)    left side   High cholesterol    History of seizure    after a spider bite; 07/15/21   History of stroke with residual deficit    left-side weakness   Hypertension    states BP under control with meds., has been on med. x 2 yr.   Hypothyroidism    Non-insulin dependent type 2 diabetes mellitus (HCC)    Overactive bladder    PFO (patent foramen ovale) 05/15/2021   Stroke ([place])    1998 weakness on left side; 05/12/21    SURGICAL HISTORY: Past Surgical History:  Procedure Laterality Date   ABDOMINAL HYSTERECTOMY     complete   AV FISTULA PLACEMENT Right 09/29/2021   Procedure: INSERTION OF RIGHT ARM ARTERIOVENOUS (AV) GORE-TEX GRAFT;  Surgeon: Angelia Mould, MD;  Location: [place];  Service: Vascular;  Laterality: Right;   BREAST BIOPSY Left 08/03/2018   Benign adipose tissue   BREAST BIOPSY Right 02/15/2023   [place] RT BREAST BX W LOC DEV 1ST LESION IMG BX SPEC US GUIDE 02/15/2023 [facility]   BREAST BIOPSY Left 02/15/2023   [place] LT  BREAST BX W LOC DEV 1ST LESION IMG BX [place] US GUIDE 02/15/2023 [facility]AMMOGR[facility]   BREAST EXCISIONAL BIOPSY Right 2014   Positive   BREAST LUMPECTOMY Right    BUBBLE STUDY  05/15/2021   Procedure: BUBBLE STUDY;  Surgeon: Jerline Pain, MD;  Location: [place] ENDOSCOPY;  Service: Cardiovascular;;   CATARACT EXTRACTION W/ INTRAOCULAR LENS IMPLANT Left    CEREBRAL ANEURYSM REPAIR  1998   DIALYSIS/PERMA CATHETER INSERTION N/A 04/10/2021   Procedure: DIALYSIS/PERMA CATHETER INSERTION;  Surgeon: Algernon Huxley, MD;  Location: [place] CV LAB;  Service: Cardiovascular;  Laterality: N/A;   IR FLUORO GUIDE CV LINE RIGHT  04/30/2021   PICC LINE INSERTION     TEE WITHOUT CARDIOVERSION N/A 05/15/2021   Procedure: TRANSESOPHAGEAL ECHOCARDIOGRAM (TEE);  Surgeon: Jerline Pain, MD;  Location: [facility] ENDOSCOPY;  Service: Cardiovascular;  Laterality: N/A;   THYROID LOBECTOMY Right 07/15/2017   Procedure: RIGHT THYROID LOBECTOMY;  Surgeon: Armandina Gemma, MD;  [facility] OR;  Service: General;  Laterality: Right;    SOCIAL HISTORY: Social History   Socioeconomic History   Marital status: Single    Spouse name: Not on file   Number of children: Not on file   Years of education: Not on file   Highest education level: Not on file  Occupational History   Not on file  Tobacco Use   Smoking status: Never   Smokeless tobacco: Never  Vaping Use   Vaping Use: Never used  Substance and Sexual Activity   Alcohol use: No   Drug use: No   Sexual activity: Never    Birth control/protection: Post-menopausal  Other Topics Concern   Not on file  Social History Narrative   Lives with son and wife   Social Determinants of Health   Financial Resource Strain: Not on file  Food Insecurity: Not on file  Transportation Needs: Not on file  Physical Activity: Not on file  Stress: Not on file  Social Connections: Not on file  Intimate Partner Violence: Not on file    FAMILY HISTORY: Family History  Problem Relation Age of Onset   Cancer Brother        possible prostate cancer per her daughter   Breast cancer Neg Hx     ALLERGIES:  is allergic to contrast media [iodinated contrast media], latex, and levemir [insulin detemir].  MEDICATIONS:  Current Outpatient Medications  Medication Sig Dispense Refill   amoxicillin-clavulanate (AUGMENTIN) 875-125 MG tablet Take 1 tablet by mouth every 12 (twelve) hours. 14 tablet 0   aspirin 81 MG chewable tablet Chew 1 tablet (81 mg total) by mouth daily. 60 tablet 0   azithromycin  (ZITHROMAX) 250 MG tablet Take 1 tablet (250 mg total) by mouth daily. Take first 2 tablets together, then 1 every day until finished. 6 tablet 0   B Complex-C-Zn-Folic Acid (DIALYVITE Q000111Q WITH ZINC) 0.8 MG TABS Take 1 tablet by mouth daily.     calcitRIOL (ROCALTROL) 0.5 MCG capsule Take 1 capsule (0.5 mcg total) by mouth every Monday, Wednesday, and Friday with hemodialysis. 30 capsule 0   dextrose (GLUTOSE) 40 % GEL Place 28 mLs inside cheek as needed (for suspected low sugars). 37.5 g 2   lacosamide 100 MG TABS Take 1 tablet (100 mg total) by mouth 2 (two) times daily. 60 tablet 0   latanoprost (XALATAN) 0.005 % ophthalmic solution Place 1 drop into both eyes at bedtime. 2.5 mL 0   letrozole (FEMARA) 2.5 MG tablet Take  1 tablet (2.5 mg total) by mouth daily. 90 tablet 3   levothyroxine (SYNTHROID, LEVOTHROID) 112 MCG tablet Take 1 tablet (112 mcg total) by mouth daily before breakfast. 90 tablet 0   lidocaine-prilocaine (EMLA) cream Apply 1 application. topically 3 (three) times a week.     metoprolol succinate (TOPROL-XL) 50 MG 24 hr tablet Take 50 mg by mouth daily.     multivitamin (RENA-VIT) TABS tablet Take 1 tablet by mouth at bedtime. 30 tablet 0   Nutritional Supplements (,FEEDING SUPPLEMENT, PROSOURCE PLUS) liquid Take 30 mLs by mouth 3 (three) times daily between meals. 1000 mL 0   Nutritional Supplements (FEEDING SUPPLEMENT, BOOST BREEZE,) LIQD Take 1 each by mouth 3 (three) times daily. 10000 mL 0   sucroferric oxyhydroxide (VELPHORO) 500 MG chewable tablet Chew 500 mg by mouth 3 (three) times daily with meals.     No current facility-administered medications for this visit.    REVIEW OF SYSTEMS:   Constitutional: Denies fevers, chills or abnormal night sweats   All other systems were reviewed with the patient and are negative.  PHYSICAL EXAMINATION: ECOG PERFORMANCE STATUS: 3 - Symptomatic, >50% confined to bed  Vitals:   02/25/23 1259  BP: (!) 144/70  Pulse: 79  Resp:  18  Temp: (!) 97.3 F (36.3 C)  SpO2: 99%   There were no vitals filed for this visit.  GENERAL:alert, no distress and comfortable    LABORATORY DATA:  I have reviewed the data as listed Lab Results  Component Value Date   WBC 6.1 12/27/2022   HGB 10.1 (L) 12/27/2022   HCT 33.5 (L) 12/27/2022   MCV 105.3 (H) 12/27/2022   PLT 191 12/27/2022   Lab Results  Component Value Date   NA 139 12/27/2022   K 3.4 (L) 12/27/2022   CL 96 (L) 12/27/2022   CO2 26 12/27/2022    RADIOGRAPHIC STUDIES: I have personally reviewed the radiological reports and agreed with the findings in the report.  ASSESSMENT AND PLAN:  Malignant neoplasm of upper-outer quadrant of right breast in female, estrogen receptor positive ([place]) 02/15/23: Right breast nipple changes, new heterogeneous calcifications, new asymmetry right retroareolar region 2 cm area, right axilla: 1 abnormal lymph node, ill-defined hypoechoic area left breast 5.1 cm diffuse skin thickening left breast: Right breast biopsy: Grade 2 IDC ER 95%, PR 95%, Ki-67 5%, HER2 1+ negative; left breast biopsy: Benign  (07/14/2015: Right lumpectomy: IDC grade 2, 2.5 cm, 1/2 sentinel nodes positive, DCIS, LCIS, lateral inferior and deep margins positive for LCIS, T2 N1 M0 stage II a, radiation did not recommend adjuvant radiation ( at [facility]) followed by antiestrogen therapy with letrozole 08/21/2015-unclear how long she took)  Pathology and radiology counseling: Discussed with the patient, the details of pathology including the type of breast cancer,the clinical staging, the significance of ER, PR and HER-2/neu receptors and the implications for treatment. After reviewing the pathology in detail, we proceeded to discuss the different treatment options between surgery, radiation, chemotherapy, antiestrogen therapies.  Treatment plan: Right lumpectomy Followed by adjuvant antiestrogen therapy  Because of prior stroke and poor performance  status, treatment options are limited. All questions were answered. The patient knows to call the clinic with any problems, questions or concerns.    Harriette Ohara, MD 02/25/23

## 2023-02-28 ENCOUNTER — Encounter: Payer: Self-pay | Admitting: *Deleted

## 2023-02-28 ENCOUNTER — Telehealth: Payer: Self-pay | Admitting: Hematology and Oncology

## 2023-02-28 NOTE — Telephone Encounter (Signed)
Scheduled appointment per 3/22 los. Talked with the patients son, and he is aware of the made appointment.

## 2023-03-07 ENCOUNTER — Ambulatory Visit: Payer: Medicare (Managed Care) | Admitting: Hematology and Oncology

## 2023-03-10 ENCOUNTER — Other Ambulatory Visit: Payer: PRIVATE HEALTH INSURANCE

## 2023-03-16 NOTE — Progress Notes (Signed)
Patient Care Team: [facility] as PCP - General Berenice Primas, [facility] Pershing Proud, RN as Oncology Nurse Navigator Donnelly Angelica, RN as Oncology Nurse Navigator Serena Croissant, MD as Consulting Physician (Hematology and Oncology)  DIAGNOSIS: No diagnosis found.  SUMMARY OF ONCOLOGIC HISTORY: Oncology History  Malignant neoplasm of upper-outer quadrant of right breast in female, estrogen receptor positive  03/14/2015 Initial Diagnosis   On workup for blood work cancer PET/CT showed right breast lesion, 11:00 position 2.6 cm diameter biopsy done 05/20/2015 IDC grade 2 ER 95% PR 80% HER-2 negative   07/14/2015 Surgery   Right lumpectomy: IDC grade 2, 2.5 cm, 1/2 sentinel nodes positive, DCIS, LCIS, lateral inferior and deep margins positive for LCIS, T2 N1 M0 stage II a, radiation did not recommend adjuvant radiation ( at [facility])   08/21/2015 -  Anti-estrogen oral therapy   Letrozole 2.5 mg daily   02/15/2023 Relapse/Recurrence   Right breast nipple changes, new heterogeneous calcifications, new asymmetry right retroareolar region 2 cm area, right axilla: 1 abnormal lymph node, ill-defined hypoechoic area left breast 5.1 cm diffuse skin thickening left breast: Right breast biopsy: Grade 2 IDC ER 95%, PR 95%, Ki-67 5%, HER2 1+ negative; left breast biopsy: Benign   Primary vulvar squamous cell carcinoma  03/25/2015 Surgery   Vulve[facility] by Dr. Tresa Endo at [facility]; invasive squamous cell carcinoma margins are negative, did not require any further treatment     CHIEF COMPLIANT:   INTERVAL HISTORY: Kirsten Mcmahon is a   ALLERGIES:  is allergic to contrast media [iodinated contrast media], latex, and levemir [insulin detemir].  MEDICATIONS:  Current Outpatient Medications  Medication Sig Dispense Refill   amoxicillin-clavulanate (AUGMENTIN) 875-125 MG tablet Take 1 tablet by mouth every 12 (twelve) hours. 14 tablet 0   aspirin 81 MG  chewable tablet Chew 1 tablet (81 mg total) by mouth daily. 60 tablet 0   azithromycin (ZITHROMAX) 250 MG tablet Take 1 tablet (250 mg total) by mouth daily. Take first 2 tablets together, then 1 every day until finished. 6 tablet 0   B Complex-C-Zn-Folic Acid (DIALYVITE 800 WITH ZINC) 0.8 MG TABS Take 1 tablet by mouth daily.     calcitRIOL (ROCALTROL) 0.5 MCG capsule Take 1 capsule (0.5 mcg total) by mouth every Monday, Wednesday, and Friday with hemodialysis. 30 capsule 0   dextrose (GLUTOSE) 40 % GEL Place 28 mLs inside cheek as needed (for suspected low sugars). 37.5 g 2   lacosamide 100 MG TABS Take 1 tablet (100 mg total) by mouth 2 (two) times daily. 60 tablet 0   latanoprost (XALATAN) 0.005 % ophthalmic solution Place 1 drop into both eyes at bedtime. 2.5 mL 0   letrozole (FEMARA) 2.5 MG tablet Take 1 tablet (2.5 mg total) by mouth daily. 90 tablet 3   levothyroxine (SYNTHROID, LEVOTHROID) 112 MCG tablet Take 1 tablet (112 mcg total) by mouth daily before breakfast. 90 tablet 0   lidocaine-prilocaine (EMLA) cream Apply 1 application. topically 3 (three) times a week.     metoprolol succinate (TOPROL-XL) 50 MG 24 hr tablet Take 50 mg by mouth daily.     multivitamin (RENA-VIT) TABS tablet Take 1 tablet by mouth at bedtime. 30 tablet 0   Nutritional Supplements (,FEEDING SUPPLEMENT, PROSOURCE PLUS) liquid Take 30 mLs by mouth 3 (three) times daily between meals. 1000 mL 0   Nutritional Supplements (FEEDING SUPPLEMENT, BOOST BREEZE,) LIQD Take 1 each by mouth 3 (three) times daily. 10000 mL  0   sucroferric oxyhydroxide (VELPHORO) 500 MG chewable tablet Chew 500 mg by mouth 3 (three) times daily with meals.     No current facility-administered medications for this visit.    PHYSICAL EXAMINATION: ECOG PERFORMANCE STATUS: {CHL ONC ECOG PS:[phone]}  There were no vitals filed for this visit. There were no vitals filed for this visit.  BREAST:*** No palpable masses or nodules in either  right or left breasts. No palpable axillary supraclavicular or infraclavicular adenopathy no breast tenderness or nipple discharge. (exam performed in the presence of a chaperone)  LABORATORY DATA:  I have reviewed the data as listed    Latest Ref Rng & Units 12/27/2022    3:02 PM 03/08/2022    3:50 PM 01/11/2022    5:20 PM  CMP  Glucose 70 - 99 mg/dL 202  334  86   BUN 8 - 23 mg/dL 21  67  29   Creatinine 0.44 - 1.00 mg/dL 3.56  86.16  8.37   Sodium 135 - 145 mmol/L 139  140  139   Potassium 3.5 - 5.1 mmol/L 3.4  5.0  4.3   Chloride 98 - 111 mmol/L 96  98  98   CO2 22 - 32 mmol/L 26  25    Calcium 8.9 - 10.3 mg/dL 8.8  9.1    Total Protein 6.5 - 8.1 g/dL 7.0     Total Bilirubin 0.3 - 1.2 mg/dL 0.5     Alkaline Phos 38 - 126 U/L 67     AST 15 - 41 U/L 25     ALT 0 - 44 U/L 15       Lab Results  Component Value Date   WBC 6.1 12/27/2022   HGB 10.1 (L) 12/27/2022   HCT 33.5 (L) 12/27/2022   MCV 105.3 (H) 12/27/2022   PLT 191 12/27/2022   NEUTROABS 4.4 12/27/2022    ASSESSMENT & PLAN:  No problem-specific Assessment & Plan notes found for this encounter.    No orders of the defined types were placed in this encounter.  The patient has a good understanding of the overall plan. she agrees with it. she will call with any problems that may develop before the next visit here. Total time spent: 30 mins including face to face time and time spent for planning, charting and co-ordination of care   Sherlyn Lick, CMA 03/16/23    I Janan Ridge am acting as a Neurosurgeon for [employer]  ***

## 2023-03-17 ENCOUNTER — Other Ambulatory Visit: Payer: Self-pay

## 2023-03-17 ENCOUNTER — Inpatient Hospital Stay: Payer: Medicare (Managed Care) | Attending: Hematology and Oncology | Admitting: Hematology and Oncology

## 2023-03-17 VITALS — BP 117/69 | HR 95 | Temp 97.2°F | Resp 19 | Ht 62.0 in

## 2023-03-17 DIAGNOSIS — N6489 Other specified disorders of breast: Secondary | ICD-10-CM | POA: Insufficient documentation

## 2023-03-17 DIAGNOSIS — Z170 Estrogen receptor positive status [ER+]: Secondary | ICD-10-CM | POA: Insufficient documentation

## 2023-03-17 DIAGNOSIS — Z79811 Long term (current) use of aromatase inhibitors: Secondary | ICD-10-CM | POA: Insufficient documentation

## 2023-03-17 DIAGNOSIS — Z8589 Personal history of malignant neoplasm of other organs and systems: Secondary | ICD-10-CM | POA: Insufficient documentation

## 2023-03-17 DIAGNOSIS — C50411 Malignant neoplasm of upper-outer quadrant of right female breast: Secondary | ICD-10-CM | POA: Insufficient documentation

## 2023-03-17 DIAGNOSIS — Z91041 Radiographic dye allergy status: Secondary | ICD-10-CM | POA: Diagnosis not present

## 2023-03-17 DIAGNOSIS — Z79899 Other long term (current) drug therapy: Secondary | ICD-10-CM | POA: Insufficient documentation

## 2023-03-17 MED ORDER — ANASTROZOLE 1 MG PO TABS: 1.0000 mg | ORAL_TABLET | Freq: Every day | ORAL | 3 refills | Status: DC

## 2023-03-17 NOTE — Assessment & Plan Note (Signed)
02/15/23: Right breast nipple changes, new heterogeneous calcifications, new asymmetry right retroareolar region 2 cm area, right axilla: 1 abnormal lymph node, ill-defined hypoechoic area left breast 5.1 cm diffuse skin thickening left breast: Right breast biopsy: Grade 2 IDC ER 95%, PR 95%, Ki-67 5%, HER2 1+ negative; left breast biopsy: Benign   (07/14/2015: Right lumpectomy: IDC grade 2, 2.5 cm, 1/2 sentinel nodes positive, DCIS, LCIS, lateral inferior and deep margins positive for LCIS, T2 N1 M0 stage II a, radiation did not recommend adjuvant radiation ( at [facility]) followed by antiestrogen therapy with letrozole 08/21/19  Treatment plan: Right lumpectomy Followed by adjuvant antiestrogen therapy   Because of prior stroke and poor performance status, treatment options are limited.16-unclear how long she took)

## 2023-03-23 ENCOUNTER — Encounter: Payer: Self-pay | Admitting: Neurology

## 2023-03-25 ENCOUNTER — Encounter: Payer: Self-pay | Admitting: *Deleted

## 2023-04-14 ENCOUNTER — Ambulatory Visit: Payer: Medicare (Managed Care) | Admitting: Internal Medicine

## 2023-04-19 ENCOUNTER — Ambulatory Visit: Payer: Medicare (Managed Care) | Attending: Internal Medicine | Admitting: Internal Medicine

## 2023-04-19 NOTE — Progress Notes (Deleted)
Cardiology Office Note:    Date:  04/19/2023   ID:  Kirsten Mcmahon, DOB [birth year], MRN [id number]  PCP:  [facility]   [facility] Providers Cardiologist:  None { Click to update primary MD,subspecialty MD or APP then REFRESH:1}    Referring MD: [facility]*   No chief complaint on file. Pre-OP  History of Present Illness:    Kirsten Mcmahon is a 77 y.o. female with a hx of squamous cell carcinoma s/p vulvectomy 03/2015, R breast cancer ER/PR +/Her2-, HTN well controlled DM2, ESRD on IHD MWF, PFO, CVA with residual L sided paralysis,  pre-op for R lumpectomy from [facility]  She sees Dr. Pamelia Hoit. She was diagnosed with R breast CA 03/2015. Hormone +, Her2-. R lumpectomy August 2016. Managed with letrozole 08/2015-. Relapse R breast CA 02/2023. Again hormone +, HER2(-). She had an evaluation regarding surgery, and she decided she did not want to undergo surgery. She was also thought to be high risk by Dr. Carolynne Edouard. A referral was sent to cardiology for pre-op eval.  She had a CVA 05/2021. CT head 6/2 showed old R MCA territoyr infarct and findings of chronic microvascular ischemia. Brain MRI showed few punctate acute infartcs of the R temporal lobe, BL thalamus, and L parietal lobe. Was planned for cardiac monitor but she don't see.  Otherwise, no known hx of atrial fibrillation. No hx of MI. LV function has been normal.  She has no    Past Medical History:  Diagnosis Date   Cancer (HCC) 2017   Right breast   Chronic kidney disease    progression to ESRD 04/10/21   GERD (gastroesophageal reflux disease)    Glaucoma    Hemiparesis (HCC)    left side   High cholesterol    History of seizure    after a spider bite; 07/15/21   History of stroke with residual deficit    left-side weakness   Hypertension    states BP under control with meds., has been on med. x 2 yr.   Hypothyroidism    Non-insulin dependent type 2 diabetes mellitus (HCC)     Overactive bladder    PFO (patent foramen ovale) 05/15/2021   Stroke (HCC)    1998 weakness on left side; 05/12/21    Past Surgical History:  Procedure Laterality Date   ABDOMINAL HYSTERECTOMY     complete   AV FISTULA PLACEMENT Right 09/29/2021   Procedure: INSERTION OF RIGHT ARM ARTERIOVENOUS (AV) GORE-TEX GRAFT;  Surgeon: Chuck Hint, MD;  [facility] OR;  Service: Vascular;  Laterality: Right;   BREAST BIOPSY Left 08/03/2018   Benign adipose tissue   BREAST BIOPSY Right 02/15/2023   [place] RT BREAST BX W LOC DEV 1ST LESION IMG BX SPEC US GUIDE 02/15/2023 [facility] MAMMOGRAP[facility]   BREAST BIOPSY Left 02/15/2023   [place] LT BREAST BX W LOC DEV 1ST LESION IMG BX SPEC US GUIDE 02/15/2023 [facility] MAMMOGRAPHY   BREAST EXCISIONAL BIOPSY Right 2014   Positive   BREAST LUMPECTOMY Right    BUBBLE STUDY  05/15/2021   Procedure: BUBBLE STUDY;  Surgeon: Jake Bathe, MD;  Location: [facility] ENDOSCOPY;  Service: Cardiovascular;;   CATARACT EXTRACTION W/ INTRAOCULAR LENS IMPLANT Left    CEREBRAL ANEURYSM REPAIR  1998   DIALYSIS/PERMA CATHETER INSERTION N/A 04/10/2021   Procedure: DIALYSIS/PERMA CATHETER INSERTION;  Surgeon: Annice Needy, MD;  Location: [facility] INVASIVE CV LAB;  Service: Cardiovascular;  Laterality: N/A;  IR FLUORO GUIDE CV LINE RIGHT  04/30/2021   PICC LINE INSERTION     TEE WITHOUT CARDIOVERSION N/A 05/15/2021   Procedure: TRANSESOPHAGEAL ECHOCARDIOGRAM (TEE);  Surgeon: Jake Bathe, MD;  Location: [facility] ENDOSCOPY;  Service: Cardiovascular;  Laterality: N/A;   THYROID LOBECTOMY Right 07/15/2017   Procedure: RIGHT THYROID LOBECTOMY;  Surgeon: Darnell Level, MD;  Location: [facility] OR;  Service: General;  Laterality: Right;    Current Medications: No outpatient medications have been marked as taking for the 04/19/23 encounter (Appointment) with [name], MD.     Allergies:   Contrast media [iodinated contrast media], Latex, Shellfish-derived products, and Levemir [insulin detemir]   Social  History   Socioeconomic History   Marital status: Single    Spouse name: Not on file   Number of children: Not on file   Years of education: Not on file   Highest education level: Not on file  Occupational History   Not on file  Tobacco Use   Smoking status: Never   Smokeless tobacco: Never  Vaping Use   Vaping Use: Never used  Substance and Sexual Activity   Alcohol use: No   Drug use: No   Sexual activity: Never    Birth control/protection: Post-menopausal  Other Topics Concern   Not on file  Social History Narrative   Lives with son and wife   Social Determinants of Health   Financial Resource Strain: Not on file  Food Insecurity: Not on file  Transportation Needs: Not on file  Physical Activity: Not on file  Stress: Not on file  Social Connections: Not on file     Family History: The patient's family history includes Cancer in her brother. There is no history of Breast cancer.  ROS:   Please see the history of present illness.   All other systems reviewed and are negative.  EKGs/Labs/Other Studies Reviewed:    The following studies were reviewed today: ***  EKG:  EKG is *** ordered today.  The ekg ordered today demonstrates ***   TTE 07/02/2019- EF 50-60%, nl RV fxn, no valve dx  TTE 04/20/2021- EF 55-60%, no sig changes.  TEE: 05/15/2021- EF 65-70%, + PFO  Recent Labs: 12/27/2022: ALT 15; BUN 21; Creatinine, Ser 5.62; Hemoglobin 10.1; Platelets 191; Potassium 3.4; Sodium 139  Recent Lipid Panel    Component Value Date/Time   LDLDIRECT 88.5 05/13/2021 0429     Risk Assessment/Calculations:   {Does this patient have ATRIAL FIBRILLATION?[phone]}  No BP recorded.  {Refresh Note OR Click here to enter BP  :1}***         Physical Exam:    VS:  There were no vitals taken for this visit.    Wt Readings from Last 3 Encounters:  12/15/21 134 lb (60.8 kg)  11/17/21 120 lb (54.4 kg)  09/29/21 120 lb (54.4 kg)     GEN: in a wheel chair *** Well  nourished, well developed in no acute distress HEENT: Normal NECK: No JVD; No carotid bruits LYMPHATICS: No lymphadenopathy CARDIAC: ***RRR, no murmurs, rubs, gallops RESPIRATORY:  Clear to auscultation without rales, wheezing or rhonchi  ABDOMEN: Soft, non-tender, non-distended MUSCULOSKELETAL:  No edema; No deformity  SKIN: Warm and dry NEUROLOGIC:  Alert and oriented x 3, L sided paralysis PSYCHIATRIC:  Normal affect   ASSESSMENT:    Pre-Op: NSQIP- approx. 9% overall risk for adverse complications, cardiac complications risk is low 0.2%.    PLAN:    In order of problems listed above:  ***      {  Are you ordering a CV Procedure (e.g. stress test, cath, DCCV, TEE, etc)?   Press F2        :[id number]}    Medication Adjustments/Labs and Tests Ordered: Current medicines are reviewed at length with the patient today.  Concerns regarding medicines are outlined above.  No orders of the defined types were placed in this encounter.  No orders of the defined types were placed in this encounter.   There are no Patient Instructions on file for this visit.   Signed, [name], MD  04/19/2023 8:51 AM    [facility]

## 2023-04-28 ENCOUNTER — Ambulatory Visit: Payer: Medicare (Managed Care) | Admitting: Neurology

## 2023-05-18 ENCOUNTER — Encounter: Payer: Self-pay | Admitting: *Deleted

## 2023-05-25 ENCOUNTER — Other Ambulatory Visit: Payer: Self-pay

## 2023-05-25 ENCOUNTER — Encounter (HOSPITAL_COMMUNITY): Payer: Self-pay

## 2023-05-25 ENCOUNTER — Emergency Department (HOSPITAL_COMMUNITY): Payer: Medicare (Managed Care)

## 2023-05-25 ENCOUNTER — Emergency Department (HOSPITAL_COMMUNITY)
Admission: EM | Admit: 2023-05-25 | Discharge: 2023-05-25 | Disposition: A | Discharge status: Home / Self Care | Payer: Medicare (Managed Care) | Attending: Emergency Medicine | Admitting: Emergency Medicine

## 2023-05-25 DIAGNOSIS — Z7982 Long term (current) use of aspirin: Secondary | ICD-10-CM | POA: Insufficient documentation

## 2023-05-25 DIAGNOSIS — Z992 Dependence on renal dialysis: Secondary | ICD-10-CM | POA: Diagnosis not present

## 2023-05-25 DIAGNOSIS — E039 Hypothyroidism, unspecified: Secondary | ICD-10-CM | POA: Insufficient documentation

## 2023-05-25 DIAGNOSIS — Z85828 Personal history of other malignant neoplasm of skin: Secondary | ICD-10-CM | POA: Insufficient documentation

## 2023-05-25 DIAGNOSIS — I120 Hypertensive chronic kidney disease with stage 5 chronic kidney disease or end stage renal disease: Secondary | ICD-10-CM | POA: Insufficient documentation

## 2023-05-25 DIAGNOSIS — R531 Weakness: Secondary | ICD-10-CM | POA: Diagnosis present

## 2023-05-25 DIAGNOSIS — N186 End stage renal disease: Secondary | ICD-10-CM | POA: Diagnosis not present

## 2023-05-25 DIAGNOSIS — Z79890 Hormone replacement therapy (postmenopausal): Secondary | ICD-10-CM | POA: Diagnosis not present

## 2023-05-25 DIAGNOSIS — Z79899 Other long term (current) drug therapy: Secondary | ICD-10-CM | POA: Insufficient documentation

## 2023-05-25 DIAGNOSIS — Z91040 Latex allergy status: Secondary | ICD-10-CM | POA: Insufficient documentation

## 2023-05-25 DIAGNOSIS — R404 Transient alteration of awareness: Secondary | ICD-10-CM | POA: Insufficient documentation

## 2023-05-25 DIAGNOSIS — E1122 Type 2 diabetes mellitus with diabetic chronic kidney disease: Secondary | ICD-10-CM | POA: Diagnosis not present

## 2023-05-25 DIAGNOSIS — Z8673 Personal history of transient ischemic attack (TIA), and cerebral infarction without residual deficits: Secondary | ICD-10-CM | POA: Diagnosis not present

## 2023-05-25 DIAGNOSIS — Z853 Personal history of malignant neoplasm of breast: Secondary | ICD-10-CM | POA: Diagnosis not present

## 2023-05-25 DIAGNOSIS — R2990 Unspecified symptoms and signs involving the nervous system: Secondary | ICD-10-CM

## 2023-05-25 LAB — DIFFERENTIAL
Abs Immature Granulocytes: 0.03 10*3/uL (ref 0.00–0.07)
Basophils Absolute: 0 10*3/uL (ref 0.0–0.1)
Basophils Relative: 0 %
Eosinophils Absolute: 0.1 10*3/uL (ref 0.0–0.5)
Eosinophils Relative: 2 %
Immature Granulocytes: 0 %
Lymphocytes Relative: 20 %
Lymphs Abs: 1.6 10*3/uL (ref 0.7–4.0)
Monocytes Absolute: 0.7 10*3/uL (ref 0.1–1.0)
Monocytes Relative: 9 %
Neutro Abs: 5.8 10*3/uL (ref 1.7–7.7)
Neutrophils Relative %: 69 %

## 2023-05-25 LAB — CBG MONITORING, ED: Glucose-Capillary: 117 mg/dL — ABNORMAL HIGH (ref 70–99)

## 2023-05-25 LAB — ETHANOL: Alcohol, Ethyl (B): <10 mg/dL (ref <10)

## 2023-05-25 LAB — COMPREHENSIVE METABOLIC PANEL
ALT: 18 U/L (ref 0–44)
AST: 25 U/L (ref 15–41)
Albumin: 3.6 g/dL (ref 3.5–5.0)
Alkaline Phosphatase: 73 U/L (ref 38–126)
Anion gap: 17 — ABNORMAL HIGH (ref 5–15)
BUN: 11 mg/dL (ref 8–23)
CO2: 29 mmol/L (ref 22–32)
Calcium: 9.2 mg/dL (ref 8.9–10.3)
Chloride: 91 mmol/L — ABNORMAL LOW (ref 98–111)
Creatinine, Ser: 4.26 mg/dL — ABNORMAL HIGH (ref 0.44–1.00)
GFR, Estimated: 10 mL/min — ABNORMAL LOW (ref >60)
Glucose, Bld: 126 mg/dL — ABNORMAL HIGH (ref 70–99)
Potassium: 3.2 mmol/L — ABNORMAL LOW (ref 3.5–5.1)
Sodium: 137 mmol/L (ref 135–145)
Total Bilirubin: 0.4 mg/dL (ref 0.3–1.2)
Total Protein: 8 g/dL (ref 6.5–8.1)

## 2023-05-25 LAB — POCT I-STAT, CHEM 8
BUN: 15 mg/dL (ref 8–23)
Calcium, Ion: 0.76 mmol/L — CL (ref 1.15–1.40)
Chloride: 99 mmol/L (ref 98–111)
Creatinine, Ser: 4 mg/dL — ABNORMAL HIGH (ref 0.44–1.00)
Glucose, Bld: 127 mg/dL — ABNORMAL HIGH (ref 70–99)
HCT: 39 % (ref 36.0–46.0)
Hemoglobin: 13.3 g/dL (ref 12.0–15.0)
Potassium: 3.9 mmol/L (ref 3.5–5.1)
Sodium: 133 mmol/L — ABNORMAL LOW (ref 135–145)
TCO2: 27 mmol/L (ref 22–32)

## 2023-05-25 LAB — CBC
HCT: 37.3 % (ref 36.0–46.0)
Hemoglobin: 11.8 g/dL — ABNORMAL LOW (ref 12.0–15.0)
MCH: 32.3 pg (ref 26.0–34.0)
MCHC: 31.6 g/dL (ref 30.0–36.0)
MCV: 102.2 fL — ABNORMAL HIGH (ref 80.0–100.0)
Platelets: 225 10*3/uL (ref 150–400)
RBC: 3.65 MIL/uL — ABNORMAL LOW (ref 3.87–5.11)
RDW: 14.2 % (ref 11.5–15.5)
WBC: 8.3 10*3/uL (ref 4.0–10.5)
nRBC: 0 % (ref 0.0–0.2)

## 2023-05-25 LAB — PROTIME-INR
INR: 0.9 (ref 0.8–1.2)
Prothrombin Time: 12.8 seconds (ref 11.4–15.2)

## 2023-05-25 LAB — APTT: aPTT: 25 seconds (ref 24–36)

## 2023-05-25 MED ORDER — SODIUM CHLORIDE 0.9% FLUSH: 3.0000 mL | Freq: Once | INTRAVENOUS | Status: DC

## 2023-05-25 NOTE — ED Provider Notes (Signed)
[place] [facility] DEPARTMENT AT [facility] Provider Note   CSN: [id number] Arrival date & time: 05/25/23  1705  An emergency department physician performed an initial assessment on this suspected stroke patient at 13.  History  Chief Complaint  Patient presents with   Code Stroke    Kirsten Mcmahon is a 77 y.o. female with ESRD on HD MWF, HTN, GERD, anemia of chronic disease, T2DM, primary vulvar squamous cell carcinoma, history of CVA, focal seizures on vimpat, who presents with  L sided weakness worse than usual, right sided gaze, and nonresponsive episode for approximately 15 minutes.  Was staring straight ahead.  This all developed promptly after dialysis while still at dialysis facility, LKW at 1618 p.m.  On arrival patient chief complaint is that she just feels tired. She denies any pain anywhere, numbness/tingling, asymmetric weakness, visual changes, N/V/D/C, f/c, urinary symptoms. Pt is alert to self and family at baseline. She hasn't missed any doses of vimpat. No seizure-like activity witnessed by EMS.   HPI     Home Medications Prior to Admission medications   Medication Sig Start Date End Date Taking? Authorizing Provider  amoxicillin-clavulanate (AUGMENTIN) 875-125 MG tablet Take 1 tablet by mouth every 12 (twelve) hours. 12/27/22   Sloan Leiter, DO  anastrozole (ARIMIDEX) 1 MG tablet Take 1 tablet (1 mg total) by mouth daily. 03/17/23   Serena Croissant, MD  aspirin 81 MG chewable tablet Chew 1 tablet (81 mg total) by mouth daily. 08/01/21   Rolly Salter, MD  azithromycin (ZITHROMAX) 250 MG tablet Take 1 tablet (250 mg total) by mouth daily. Take first 2 tablets together, then 1 every day until finished. 12/27/22   Sloan Leiter, DO  B Complex-C-Zn-Folic Acid (DIALYVITE 800 WITH ZINC) 0.8 MG TABS Take 1 tablet by mouth daily. 01/13/22   [provider]  calcitRIOL (ROCALTROL) 0.5 MCG capsule Take 1 capsule (0.5 mcg total) by mouth every Monday,  Wednesday, and Friday with hemodialysis. 07/31/21   Rolly Salter, MD  dextrose (GLUTOSE) 40 % GEL Place 28 mLs inside cheek as needed (for suspected low sugars). 07/31/21   Rolly Salter, MD  lacosamide 100 MG TABS Take 1 tablet (100 mg total) by mouth 2 (two) times daily. 07/31/21   Rolly Salter, MD  latanoprost (XALATAN) 0.005 % ophthalmic solution Place 1 drop into both eyes at bedtime. 09/07/18   Tommie Sams, DO  levothyroxine (SYNTHROID, LEVOTHROID) 112 MCG tablet Take 1 tablet (112 mcg total) by mouth daily before breakfast. 09/07/18   Tommie Sams, DO  lidocaine-prilocaine (EMLA) cream Apply 1 application. topically 3 (three) times a week. 02/04/22   [provider]  metoprolol succinate (TOPROL-XL) 50 MG 24 hr tablet Take 50 mg by mouth daily. 08/20/20   [provider]  multivitamin (RENA-VIT) TABS tablet Take 1 tablet by mouth at bedtime. 07/31/21   Rolly Salter, MD  Nutritional Supplements (,FEEDING SUPPLEMENT, PROSOURCE PLUS) liquid Take 30 mLs by mouth 3 (three) times daily between meals. 07/31/21   Rolly Salter, MD  Nutritional Supplements (FEEDING SUPPLEMENT, BOOST BREEZE,) LIQD Take 1 each by mouth 3 (three) times daily. 07/31/21   Rolly Salter, MD  sucroferric oxyhydroxide (VELPHORO) 500 MG chewable tablet Chew 500 mg by mouth 3 (three) times daily with meals. 10/13/21   [provider]      Allergies    Contrast media [iodinated contrast media], Latex, Shellfish-derived products, and Levemir [insulin detemir]  Review of Systems   Review of Systems A 10 point review of systems was performed and is negative unless otherwise reported in HPI.  Physical Exam Updated Vital Signs BP 115/64   Pulse 83   Temp 97.9 F (36.6 C) (Oral)   Resp 17   Ht 5\' 2"  (1.575 m)   Wt 60.2 kg   SpO2 100%   BMI 24.27 kg/m  Physical Exam General: Normal appearing female, lying in bed.  HEENT: PERRLA, Sclera anicteric, MMM, trachea midline.  Cardiology:  RRR, no murmurs/rubs/gallops. BL radial and DP pulses equal bilaterally.  Resp: Normal respiratory rate and effort. CTAB, no wheezes, rhonchi, crackles.  Abd: Soft, non-tender, non-distended. No rebound tenderness or guarding.  GU: Deferred. MSK: No peripheral edema or signs of trauma. Extremities without deformity or TTP. No cyanosis or clubbing. Skin: warm, dry. No rashes or lesions. Back: No CVA tenderness Neuro: A&Ox4, CNs II-XII grossly intact. MAEs. Sensation grossly intact.  Psych: Normal mood and affect.    1a  Level of consciousness: 0=alert; keenly responsive  1b. LOC questions:  2=Performs neither task correctly  1c. LOC commands: 0=Performs both tasks correctly  2.  Best Gaze: 1=partial gaze palsy  3.  Visual: 0=No visual loss  4. Facial Palsy: 1=Minor facial paralysis  5a.  Motor left arm: 4=No movement  5b.  Motor right arm: 2=Some effort against gravity, limb cannot get to or maintain (if cured) 90 (or 45) degrees, drifts down to bed, but has some effort against gravity  6a. motor left leg: 3=No effort against gravity, limb falls  6b  Motor right leg:  2=Some effort against gravity, limb cannot get to or maintain (if cured) 90 (or 45) degrees, drifts down to bed, but has some effort against gravity  7. Limb Ataxia: 0=Absent  8.  Sensory: 0=Normal; no sensory loss  9. Best Language:  0=No aphasia, normal  10. Dysarthria: 0=Normal  11. Extinction and Inattention: 0=No abnormality   Total:   15        ED Results / Procedures / Treatments   Labs (all labs ordered are listed, but only abnormal results are displayed) Labs Reviewed  CBC - Abnormal; Notable for the following components:      Result Value   RBC 3.65 (*)    Hemoglobin 11.8 (*)    MCV 102.2 (*)    All other components within normal limits  COMPREHENSIVE METABOLIC PANEL - Abnormal; Notable for the following components:   Potassium 3.2 (*)    Chloride 91 (*)    Glucose, Bld 126 (*)    Creatinine, Ser  4.26 (*)    GFR, Estimated 10 (*)    Anion gap 17 (*)    All other components within normal limits  CBG MONITORING, ED - Abnormal; Notable for the following components:   Glucose-Capillary 117 (*)    All other components within normal limits  POCT I-STAT, CHEM 8 - Abnormal; Notable for the following components:   Sodium 133 (*)    Creatinine, Ser 4.00 (*)    Glucose, Bld 127 (*)    Calcium, Ion 0.76 (*)    All other components within normal limits  PROTIME-INR  APTT  DIFFERENTIAL  ETHANOL  RAPID URINE DRUG SCREEN, HOSP PERFORMED  URINALYSIS, ROUTINE W REFLEX MICROSCOPIC  I-STAT CHEM 8, ED    EKG EKG Interpretation  Date/Time:  Wednesday May 25 2023 17:27:11 EDT Ventricular Rate:  89 PR Interval:  148 QRS Duration: 93 QT Interval:  419  QTC Calculation: 510 R Axis:   -20 Text Interpretation: Sinus rhythm Borderline left axis deviation Low voltage, precordial leads Consider anterior infarct Prolonged QT interval Confirmed by Vonita Moss [phone]) on 05/26/2023 2:22:25 PM  Radiology CT HEAD CODE STROKE WO CONTRAST  Result Date: 05/25/2023 CLINICAL DATA:  Code stroke. Neuro deficit, acute, stroke suspected. Right-sided gaze. EXAM: CT HEAD WITHOUT CONTRAST TECHNIQUE: Contiguous axial images were obtained from the base of the skull through the vertex without intravenous contrast. RADIATION DOSE REDUCTION: This exam was performed according to the departmental dose-optimization program which includes automated exposure control, adjustment of the mA and/or kV according to patient size and/or use of iterative reconstruction technique. COMPARISON:  CT Head 03/08/22 FINDINGS: Brain: Sequela of prior decompressive right-sided hemicraniectomy with extensive encephalomalacia in the right MCA territory involving the right frontal lobe in the right insular region. There is also a chronic right PCA territory infarct involving the right thalamus in the right occipital lobe. Additional infarct is  also noted involving the bilateral cerebellar hemispheres. No hemorrhage. No definite CT evidence of a new cortical infarct. No hydrocephalus. No extra-axial fluid collection. Vascular: No hyperdense vessel or unexpected calcification. Skull: Normal. Negative for fracture or focal lesion. Sinuses/Orbits: Middle ear or mastoid effusion. Paranasal sinuses are clear. Orbits are unremarkable. Other: None. ASPECTS [facility] Stroke Program Early CT Score): 10 when accounting for chronic findings. IMPRESSION: 1. No hemorrhage or CT evidence of an acute cortical infarct. Aspects is 10 when accounting for chronic findings 2. Sequela of moderate chronic microvascular ischemic change with multiple chronic infarcts involving the right MCA territory right PCA territory, in the bilateral cerebellar hemispheres Findings were paged to Dr. Iver Nestle on 05/25/23 at 5:22 PM vai AMION paging system. Electronically Signed   By: Lorenza Cambridge M.D.   On: 05/25/2023 17:23    Procedures Procedures    Medications Ordered in ED Medications  sodium chloride flush (NS) 0.9 % injection 3 mL (has no administration in time range)    ED Course/ Medical Decision Making/ A&P                          Medical Decision Making Amount and/or Complexity of Data Reviewed Labs: ordered. Decision-making details documented in ED Course. Radiology: ordered. Decision-making details documented in ED Course.    This patient presents to the ED for concern of unresponsiveness, this involves an extensive number of treatment options, and is a complaint that carries with it a high risk of complications and morbidity.  I considered the following differential and admission for this acute, potentially life threatening condition. However, patient is vitally stable, alert and at her baseline, afebrile.  MDM:    Given the acute onset of neurological symptoms, stroke is a concerning etiology of these acute symptoms. The neuro exam is significant for  patient's baseline left hemiparesis and partial gaze palsy with partial facial droop. Initially patient had some dysarthria however likely d/t AMS and on my assessment later patient with no dysarthria or aphasia. She is disoriented to time and situation but this is her baseline as well. Also consider seizure activity as a possible cause with left gaze deviation and h/o seizures, however her seizures are focal and she hasn't missed any vimpat. Also consider syncope. On my discussion with the son [name] later, he states that this has happened to the patient before when she has been on dialysis that he is concerned that patient does not drink enough fluid that they  are taking off too much fluid on dialysis.  She has had unresponsive episodes that were similar to this in the past.  Her CTH shows no ICH or hydrocephalus. Labs show no electrolyte derangements or hypo/hyperglycemia.    LKN 1618 pm Not a candidate for TNK as she is currently at her baseline.  Clinical Course as of 05/26/23 2353  Wed May 25, 2023  1810 Glucose-Capillary(!): 117 Unremarkable in the context of this patient's presentation  [HN]  1810 CT HEAD CODE STROKE WO CONTRAST NAICP. Old stroke noted. [HN]  [id number] Per neurology Dr. Iver Nestle: Impression: Most likely recrudescence of prior stroke symptoms in the setting of dialysis. Family expresses a preference to have the patient discharged if she is at her baseline and given her dementia, risks of hospitalization are substantial (risk of delirium) Recommendations: -Toxic/metabolic clearance per ED -If patient's family confirms she is at baseline, from a neurological perspective appropriate for outpatient follow-up -Continue home aspirin 81 mg -Continue home Vimpat 100 mg twice daily -Goal normotension, avoid hypotension -Neurology will be available as needed going forward, please reach out with any additional questions or concerns   [HN]  1848 Alcohol, Ethyl (B): <10 neg [HN]  1849 Calcium  Ionized(!!): 0.76 Wille val chem on chemistry panel [HN]  1849 Calcium: 9.2 wnl [HN]  1849 Creatinine(!): 4.26 HD patient, at baseline [HN]  2124 Called and discussed with patient's son who states that this has happened before when patient has been at dialysis.  [HN]    Clinical Course User Index [HN] Loetta Rough, MD    Labs: I Ordered, and personally interpreted labs.  The pertinent results include:  those listed above  Imaging Studies ordered: I ordered imaging studies including CTH I independently visualized and interpreted imaging. I agree with the radiologist interpretation  Additional history obtained from patient, EMS, son over phone.    Cardiac Monitoring: The patient was maintained on a cardiac monitor.  I personally viewed and interpreted the cardiac monitored which showed an underlying rhythm of: NSR  Reevaluation: After the interventions noted above, I reevaluated the patient and found that they have :resolved   Disposition: Patient reevaluated.  She states she feels well and is ready to be discharged.  She is at her baseline neurologic function.  Called and discussed with patient's son at length.  States that she had a very reassuring workup today and she was evaluated by neurology who felt that her symptoms were due to recrudescence of her old stroke and that she is safe to follow-up as an outpatient.  Patient's son reports understanding.  I also advised follow-up with patient's nephrologist if he is concerned about the amount of ultrafiltration during dialysis as this is happening before at dialysis or more frequently now she may need to have adjustments made to her dialysis regimen.  I encouraged them to still go to dialysis as scheduled.  Also advised follow-up with neurology per neurology recommendations and to continue taking her Vimpat as prescribed.  Patient and her son both report understanding.  I considered admission for this patient but I believe she is  stable for outpatient follow-up.  Discharged with discharge instructions and return precautions.  Son [name] is coming to pick her up     Co morbidities that complicate the patient evaluation  Past Medical History:  Diagnosis Date   Cancer [facility]) 2017   Right breast   Chronic kidney disease    progression to ESRD 04/10/21   GERD (gastroesophageal reflux disease)  Glaucoma    Hemiparesis (HCC)    left side   High cholesterol    History of seizure    after a spider bite; 07/15/21   History of stroke with residual deficit    left-side weakness   Hypertension    states BP under control with meds., has been on med. x 2 yr.   Hypothyroidism    Non-insulin dependent type 2 diabetes mellitus (HCC)    Overactive bladder    PFO (patent foramen ovale) 05/15/2021   Stroke (HCC)    1998 weakness on left side; 05/12/21     Medicines Meds ordered this encounter  Medications   sodium chloride flush (NS) 0.9 % injection 3 mL    I have reviewed the patients home medicines and have made adjustments as needed  Problem List / ED Course: Problem List Items Addressed This Visit   None Visit Diagnoses     Transient alteration of awareness    -  Primary                   This note was created using dictation software, which may contain spelling or grammatical errors.    Loetta Rough, MD 05/27/23 [phone]

## 2023-05-25 NOTE — ED Triage Notes (Signed)
Pt BIB [facility] from dialysis after having a full treatment. Pt is alert to self and family at baseline. L sided weakness worse than usual, right sided gaze, and nonresponsive episode for approximately 15 minutes. This all developed promptly after dialysis, LKW at 1618  143/76  Cbg 100

## 2023-05-25 NOTE — ED Notes (Signed)
Pt assisted into son's car. Son states that he is unsure how he will get pt into the house as there are steps. This RN offered to call [facility] to transfer pt. Pt's son [name] initially agreeable to this, then states that he will carry pt up the stairs. [name] taking pt home

## 2023-05-25 NOTE — ED Notes (Signed)
Pt's son [name] called this RN requesting that MD call him to discuss test results as he is looking on pt's MyChart and is not certain that he understands. Phone number given to [facility] MD.

## 2023-05-25 NOTE — Consult Note (Signed)
Neurology Consultation  Reason for Consult: Code Stroke Referring Physician: EDP  CC:  History is obtained from: patient, EMS personnel, and past medical records  HPI: Kirsten Mcmahon is a 77 y.o. female with a past medical history of recently diagnosed recurrent breast cancer, ESRD, left hemiparesis, HLD, focal seizures on Vimpat, right MCA stroke with residual total left hemiplegia, HTN,  presenting after having an episode of unresponsiveness at dialysis. EMS reports she had a right gaze deviation.  Unclear what the patient's blood pressure was at the time of the event  Son reports he thinks that she may have had too much fluid taken off in dialysis and he is concerned that she does not drink enough water although she does drink soda and other beverages that he is concerned she should not drink.  He confirms that she has recently been diagnosed with recurrent breast cancer and is on estrogen therapy for the same, declined surgical procedure due to her poor functional status at baseline.  He reports she has not missed any of her Vimpat.  EMS reports no seizure activity was conveyed to them or observed during their transport.    LKW: 4:18 PM TNK given?: No at baseline per family Premorbid modified Rankin scale (mRS): 4 4-Needs assistance to walk and tend to bodily needs    ZOX:WRUEAV to obtain due to altered mental status.   Past Medical History:  Diagnosis Date   Cancer (HCC) 2017   Right breast   Chronic kidney disease    progression to ESRD 04/10/21   GERD (gastroesophageal reflux disease)    Glaucoma    Hemiparesis (HCC)    left side   High cholesterol    History of seizure    after a spider bite; 07/15/21   History of stroke with residual deficit    left-side weakness   Hypertension    states BP under control with meds., has been on med. x 2 yr.   Hypothyroidism    Non-insulin dependent type 2 diabetes mellitus (HCC)    Overactive bladder    PFO (patent foramen  ovale) 05/15/2021   Stroke (HCC)    1998 weakness on left side; 05/12/21    Family History  Problem Relation Age of Onset   Cancer Brother        possible prostate cancer per her daughter   Breast cancer Neg Hx      Social History:   reports that she has never smoked. She has never used smokeless tobacco. She reports that she does not drink alcohol and does not use drugs.  Medications  Current Facility-Administered Medications:    sodium chloride flush (NS) 0.9 % injection 3 mL, 3 mL, Intravenous, Once, Loetta Rough, MD  Current Outpatient Medications:    amoxicillin-clavulanate (AUGMENTIN) 875-125 MG tablet, Take 1 tablet by mouth every 12 (twelve) hours., Disp: 14 tablet, Rfl: 0   anastrozole (ARIMIDEX) 1 MG tablet, Take 1 tablet (1 mg total) by mouth daily., Disp: 90 tablet, Rfl: 3   aspirin 81 MG chewable tablet, Chew 1 tablet (81 mg total) by mouth daily., Disp: 60 tablet, Rfl: 0   azithromycin (ZITHROMAX) 250 MG tablet, Take 1 tablet (250 mg total) by mouth daily. Take first 2 tablets together, then 1 every day until finished., Disp: 6 tablet, Rfl: 0   B Complex-C-Zn-Folic Acid (DIALYVITE 800 WITH ZINC) 0.8 MG TABS, Take 1 tablet by mouth daily., Disp: , Rfl:    calcitRIOL (ROCALTROL) 0.5 MCG capsule, Take 1  capsule (0.5 mcg total) by mouth every Monday, Wednesday, and Friday with hemodialysis., Disp: 30 capsule, Rfl: 0   dextrose (GLUTOSE) 40 % GEL, Place 28 mLs inside cheek as needed (for suspected low sugars)., Disp: 37.5 g, Rfl: 2   lacosamide 100 MG TABS, Take 1 tablet (100 mg total) by mouth 2 (two) times daily., Disp: 60 tablet, Rfl: 0   latanoprost (XALATAN) 0.005 % ophthalmic solution, Place 1 drop into both eyes at bedtime., Disp: 2.5 mL, Rfl: 0   levothyroxine (SYNTHROID, LEVOTHROID) 112 MCG tablet, Take 1 tablet (112 mcg total) by mouth daily before breakfast., Disp: 90 tablet, Rfl: 0   lidocaine-prilocaine (EMLA) cream, Apply 1 application. topically 3 (three)  times a week., Disp: , Rfl:    metoprolol succinate (TOPROL-XL) 50 MG 24 hr tablet, Take 50 mg by mouth daily., Disp: , Rfl:    multivitamin (RENA-VIT) TABS tablet, Take 1 tablet by mouth at bedtime., Disp: 30 tablet, Rfl: 0   Nutritional Supplements (,FEEDING SUPPLEMENT, PROSOURCE PLUS) liquid, Take 30 mLs by mouth 3 (three) times daily between meals., Disp: 1000 mL, Rfl: 0   Nutritional Supplements (FEEDING SUPPLEMENT, BOOST BREEZE,) LIQD, Take 1 each by mouth 3 (three) times daily., Disp: 10000 mL, Rfl: 0   sucroferric oxyhydroxide (VELPHORO) 500 MG chewable tablet, Chew 500 mg by mouth 3 (three) times daily with meals., Disp: , Rfl:   Exam: Current vital signs: There were no vitals taken for this visit. Vital signs in last 24 hours:    GENERAL: Awake, alert in NAD HEENT: - Normocephalic and atraumatic, dry mm, no LN++, no Thyromegally LUNGS - Clear to auscultation bilaterally with no wheezes CV - S1S2 RRR, no m/r/g, equal pulses bilaterally. ABDOMEN - Soft, nontender, nondistended with normoactive BS Ext: warm, well perfused, intact peripheral pulses, no edema  NEURO:  Mental Status: Awake and alert to self, correctly answers yes/no questions such as the name of her son Language: speech is mildly dysarthric.  Paucity of speech, intermittently follows some simple commands Cranial Nerves: PERRL.  Mild right gaze preference but able to look fully to the left. Tone: No movement on the left side even to noxious stimulation.  Increased tone of the left.  Using the right arm spontaneously, drift in both the right upper and lower extremity but potentially due to poor attention/concentration rather than weakness Sensation-equally responsive to light noxious stimulation throughout Coordination: Finger-to-nose intact on the right Gait- deferred  NIHSS 1a Level of Conscious.: 0  1b LOC Questions: 2 1c LOC Commands: 1 2 Best Gaze: 1 3 Visual: 0 4 Facial Palsy: 1 5a Motor Arm - left: 4 5b  Motor Arm - Right: 2 6a Motor Leg - Left: 3 6b Motor Leg - Right: 2 7 Limb Ataxia: 0 8 Sensory: 0 9 Best Language: 2 10 Dysarthria: 1 11 Extinct. and Inatten.: 0 (although difficult to fully assess given her baseline mental status) TOTAL: 19   Labs I have reviewed labs in epic and the results pertinent to this consultation are:   Basic Metabolic Panel: Recent Labs  Lab 05/25/23 1713  NA 133*  K 3.9  CL 99  GLUCOSE 127*  BUN 15  CREATININE 4.00*    CBC: Recent Labs  Lab 05/25/23 1713  HGB 13.3  HCT 39.0    Coagulation Studies: No results for input(s): "LABPROT", "INR" in the last 72 hours.    Imaging I have reviewed the images obtained:  CT-head personally reviewed, agree with radiology no acute intracranial process,  chronic right MCA stroke  CT Angio Head and Neck not obtained given contrast allergy and symptoms rapidly improving  MRI examination of the brain not obtained given baseline dementia and symptoms rapidly improving  Assessment: 77 y.o. female presenting with transient right gaze preference and unresponsiveness in the setting of dialysis, suspect transient hypotension.  Impression: Most likely recrudescence of prior stroke symptoms in the setting of dialysis.  Family expresses a preference to have the patient discharged if she is at her baseline and given her dementia, risks of hospitalization are substantial (risk of delirium)  Recommendations: -Toxic/metabolic clearance per ED -If patient's family confirms she is at baseline, from a neurological perspective appropriate for outpatient follow-up -Continue home aspirin 81 mg -Continue home Vimpat 100 mg twice daily -Goal normotension, avoid hypotension -Neurology will be available as needed going forward, please reach out with any additional questions or concerns   Patient seen and examined by NP/APP with MD. MD to update note as needed.   Elmer Picker, DNP, FNP-BC [facility] Pager: [phone]  Attending Neurologist's note:  I personally saw this patient, gathering history, performing a full neurologic examination, reviewing relevant labs, personally reviewing relevant imaging including head CT, and formulated the assessment and plan, adding the note above for completeness and clarity to accurately reflect my thoughts  Brooke Dare MD-PhD [facility] [phone] Available 7 AM to 7 PM, outside these hours please contact Neurologist on call listed on AMION   CRITICAL CARE Performed by: Gordy Councilman   Total critical care time: 30 minutes  Critical care time was exclusive of separately billable procedures and treating other patients.  Critical care was necessary to treat or prevent imminent or life-threatening deterioration, emergent evaluation for consideration of thrombolytic or thrombectomy  Critical care was time spent personally by me on the following activities: development of treatment plan with patient and/or surrogate as well as nursing, discussions with consultants, evaluation of patient's response to treatment, examination of patient, obtaining history from patient or surrogate, ordering and performing treatments and interventions, ordering and review of laboratory studies, ordering and review of radiographic studies, pulse oximetry and re-evaluation of patient's condition.

## 2023-05-25 NOTE — Code Documentation (Signed)
Kirsten Mcmahon is a 77 yr old female with a PMH of cancer, ESRD on dialysis, left hemiparesis, seizures, HTN presenting to [facility] on 05/25/2023. Pt is coming from Hemodialysis where she was last known well upon finishing her treatment at 1618. A few minutes after this, she developed a right sided gaze and was not responding. Pt is on no anticoagulants.     Pt met by stroke team at [facility]. Per EMS, pt improved enroute. Labs, CBG obtained, airway cleared by EDP. Pt to CT with team. NIHSS 14. Pt with rt gaze, but able to cross, left sided weakness. Some difficulty answering questions and following commands. She keeps saying "I'm cold". (Please see documentation for NIHSS details and timeline). The following imaging was obtained: CT head. Per Dr. Iver Nestle, CT is negative for any acute abnormality.     Pt returned to room 29 where her workup will continue. Dr. Iver Nestle spoke with pt's son, and he described her current state as her baseline from her prior stroke. As pt back to baseline, pt is a TIA alert. She will need q 2 hr VS and NIHSS. Should pt worsen, code stroke should be reactivated. Pt not eligible for TNK as back to baseline. Pt not eligible for thrombectomy as MRS 4 and back to baseline. Bedside handoff with ED RN complete.

## 2023-05-25 NOTE — Discharge Instructions (Signed)
Thank you for coming to [facility] Emergency Department. You were seen for episode of unresponsiveness. We did an exam, labs, and imaging, and these showed no acute findings. Please continue your seizure medicines and dialysis as scheduled. You can follow up with the neurologist in clinic as well as the nephrologist within 1-2 weeks.  Do not hesitate to return to the ED or call 911 if you experience: -Worsening symptoms -Seizure-like activity  -Loss of consciousness -Stroke-like symptoms like numbness/tingling, asymmetric weakness, slurred speech, facial droop -Lightheadedness, passing out -Fevers/chills -Anything else that concerns you

## 2023-05-26 NOTE — ED Provider Notes (Incomplete)
[place] EMERGENCY DEPARTMENT AT [facility] Provider Note   CSN: [id number] Arrival date & time: 05/25/23  1705  An emergency department physician performed an initial assessment on this suspected stroke patient at 17.  History {Add pertinent medical, surgical, social history, OB history to HPI:1} Chief Complaint  Patient presents with  . Code Stroke    Kirsten Mcmahon is a 77 y.o. female with ESRD on HD MWF, HTN, GERD, anemia of chronic disease, T2DM, primary vulvar squamous cell carcinoma, history of CVA, focal seizures on vimpat, who presents with  L sided weakness worse than usual, right sided gaze, and nonresponsive episode for approximately 15 minutes.  Was staring straight ahead.  This all developed promptly after dialysis while still at dialysis facility, [facility] at 1618 p.m.  On arrival patient chief complaint is that she just feels tired. She denies any pain anywhere, numbness/tingling, asymmetric weakness, visual changes, N/V/D/C, f/c, urinary symptoms. Pt is alert to self and family at baseline. She hasn't missed any doses of vimpat. No seizure-like activity witnessed by EMS.   HPI     Home Medications Prior to Admission medications   Medication Sig Start Date End Date Taking? Authorizing Provider  amoxicillin-clavulanate (AUGMENTIN) 875-125 MG tablet Take 1 tablet by mouth every 12 (twelve) hours. 12/27/22   Sloan Leiter, DO  anastrozole (ARIMIDEX) 1 MG tablet Take 1 tablet (1 mg total) by mouth daily. 03/17/23   Serena Croissant, MD  aspirin 81 MG chewable tablet Chew 1 tablet (81 mg total) by mouth daily. 08/01/21   Rolly Salter, MD  azithromycin (ZITHROMAX) 250 MG tablet Take 1 tablet (250 mg total) by mouth daily. Take first 2 tablets together, then 1 every day until finished. 12/27/22   Sloan Leiter, DO  B Complex-C-Zn-Folic Acid (DIALYVITE 800 WITH ZINC) 0.8 MG TABS Take 1 tablet by mouth daily. 01/13/22   [provider]  calcitRIOL (ROCALTROL) 0.5  MCG capsule Take 1 capsule (0.5 mcg total) by mouth every Monday, Wednesday, and Friday with hemodialysis. 07/31/21   Rolly Salter, MD  dextrose (GLUTOSE) 40 % GEL Place 28 mLs inside cheek as needed (for suspected low sugars). 07/31/21   Rolly Salter, MD  lacosamide 100 MG TABS Take 1 tablet (100 mg total) by mouth 2 (two) times daily. 07/31/21   Rolly Salter, MD  latanoprost (XALATAN) 0.005 % ophthalmic solution Place 1 drop into both eyes at bedtime. 09/07/18   Tommie Sams, DO  levothyroxine (SYNTHROID, LEVOTHROID) 112 MCG tablet Take 1 tablet (112 mcg total) by mouth daily before breakfast. 09/07/18   Tommie Sams, DO  lidocaine-prilocaine (EMLA) cream Apply 1 application. topically 3 (three) times a week. 02/04/22   [provider]  metoprolol succinate (TOPROL-XL) 50 MG 24 hr tablet Take 50 mg by mouth daily. 08/20/20   [provider]  multivitamin (RENA-VIT) TABS tablet Take 1 tablet by mouth at bedtime. 07/31/21   Rolly Salter, MD  Nutritional Supplements (,FEEDING SUPPLEMENT, PROSOURCE PLUS) liquid Take 30 mLs by mouth 3 (three) times daily between meals. 07/31/21   Rolly Salter, MD  Nutritional Supplements (FEEDING SUPPLEMENT, BOOST BREEZE,) LIQD Take 1 each by mouth 3 (three) times daily. 07/31/21   Rolly Salter, MD  sucroferric oxyhydroxide (VELPHORO) 500 MG chewable tablet Chew 500 mg by mouth 3 (three) times daily with meals. 10/13/21   [provider]      Allergies    Contrast media [iodinated contrast media],  Latex, Shellfish-derived products, and Levemir [insulin detemir]    Review of Systems   Review of Systems A 10 point review of systems was performed and is negative unless otherwise reported in HPI.  Physical Exam Updated Vital Signs BP 115/64   Pulse 83   Temp 97.9 F (36.6 C) (Oral)   Resp 17   Ht 5\' 2"  (1.575 m)   Wt 60.2 kg   SpO2 100%   BMI 24.27 kg/m  Physical Exam General: Normal appearing female, lying in bed.   HEENT: PERRLA, Sclera anicteric, MMM, trachea midline.  Cardiology: RRR, no murmurs/rubs/gallops. BL radial and DP pulses equal bilaterally.  Resp: Normal respiratory rate and effort. CTAB, no wheezes, rhonchi, crackles.  Abd: Soft, non-tender, non-distended. No rebound tenderness or guarding.  GU: Deferred. MSK: No peripheral edema or signs of trauma. Extremities without deformity or TTP. No cyanosis or clubbing. Skin: warm, dry. No rashes or lesions. Back: No CVA tenderness Neuro: A&Ox4, CNs II-XII grossly intact. MAEs. Sensation grossly intact.  Psych: Normal mood and affect.    1a  Level of consciousness: 0=alert; keenly responsive  1b. LOC questions:  2=Performs neither task correctly  1c. LOC commands: 0=Performs both tasks correctly  2.  Best Gaze: 1=partial gaze palsy  3.  Visual: 0=No visual loss  4. Facial Palsy: 1=Minor facial paralysis  5a.  Motor left arm: 4=No movement  5b.  Motor right arm: 2=Some effort against gravity, limb cannot get to or maintain (if cured) 90 (or 45) degrees, drifts down to bed, but has some effort against gravity  6a. motor left leg: 3=No effort against gravity, limb falls  6b  Motor right leg:  2=Some effort against gravity, limb cannot get to or maintain (if cured) 90 (or 45) degrees, drifts down to bed, but has some effort against gravity  7. Limb Ataxia: 0=Absent  8.  Sensory: 0=Normal; no sensory loss  9. Best Language:  0=No aphasia, normal  10. Dysarthria: 0=Normal  11. Extinction and Inattention: 0=No abnormality   Total:   15        ED Results / Procedures / Treatments   Labs (all labs ordered are listed, but only abnormal results are displayed) Labs Reviewed  CBC - Abnormal; Notable for the following components:      Result Value   RBC 3.65 (*)    Hemoglobin 11.8 (*)    MCV 102.2 (*)    All other components within normal limits  COMPREHENSIVE METABOLIC PANEL - Abnormal; Notable for the following components:   Potassium 3.2  (*)    Chloride 91 (*)    Glucose, Bld 126 (*)    Creatinine, Ser 4.26 (*)    GFR, Estimated 10 (*)    Anion gap 17 (*)    All other components within normal limits  CBG MONITORING, ED - Abnormal; Notable for the following components:   Glucose-Capillary 117 (*)    All other components within normal limits  POCT I-STAT, CHEM 8 - Abnormal; Notable for the following components:   Sodium 133 (*)    Creatinine, Ser 4.00 (*)    Glucose, Bld 127 (*)    Calcium, Ion 0.76 (*)    All other components within normal limits  PROTIME-INR  APTT  DIFFERENTIAL  ETHANOL  RAPID URINE DRUG SCREEN, HOSP PERFORMED  URINALYSIS, ROUTINE W REFLEX MICROSCOPIC  I-STAT CHEM 8, ED    EKG EKG Interpretation  Date/Time:  Wednesday May 25 2023 17:27:11 EDT Ventricular Rate:  89 PR  Interval:  148 QRS Duration: 93 QT Interval:  419 QTC Calculation: 510 R Axis:   -20 Text Interpretation: Sinus rhythm Borderline left axis deviation Low voltage, precordial leads Consider anterior infarct Prolonged QT interval Confirmed by Vonita Moss [phone]) on 05/26/2023 2:22:25 PM  Radiology CT HEAD CODE STROKE WO CONTRAST  Result Date: 05/25/2023 CLINICAL DATA:  Code stroke. Neuro deficit, acute, stroke suspected. Right-sided gaze. EXAM: CT HEAD WITHOUT CONTRAST TECHNIQUE: Contiguous axial images were obtained from the base of the skull through the vertex without intravenous contrast. RADIATION DOSE REDUCTION: This exam was performed according to the departmental dose-optimization program which includes automated exposure control, adjustment of the mA and/or kV according to patient size and/or use of iterative reconstruction technique. COMPARISON:  CT Head 03/08/22 FINDINGS: Brain: Sequela of prior decompressive right-sided hemicraniectomy with extensive encephalomalacia in the right MCA territory involving the right frontal lobe in the right insular region. There is also a chronic right PCA territory infarct involving the  right thalamus in the right occipital lobe. Additional infarct is also noted involving the bilateral cerebellar hemispheres. No hemorrhage. No definite CT evidence of a new cortical infarct. No hydrocephalus. No extra-axial fluid collection. Vascular: No hyperdense vessel or unexpected calcification. Skull: Normal. Negative for fracture or focal lesion. Sinuses/Orbits: Middle ear or mastoid effusion. Paranasal sinuses are clear. Orbits are unremarkable. Other: None. ASPECTS [facility] Stroke Program Early CT Score): 10 when accounting for chronic findings. IMPRESSION: 1. No hemorrhage or CT evidence of an acute cortical infarct. Aspects is 10 when accounting for chronic findings 2. Sequela of moderate chronic microvascular ischemic change with multiple chronic infarcts involving the right MCA territory right PCA territory, in the bilateral cerebellar hemispheres Findings were paged to Dr. Iver Nestle on 05/25/23 at 5:22 PM vai AMION paging system. Electronically Signed   By: Lorenza Cambridge M.D.   On: 05/25/2023 17:23    Procedures Procedures  {Document cardiac monitor, telemetry assessment procedure when appropriate:1}  Medications Ordered in ED Medications  sodium chloride flush (NS) 0.9 % injection 3 mL (has no administration in time range)    ED Course/ Medical Decision Making/ A&P                          Medical Decision Making Amount and/or Complexity of Data Reviewed Labs: ordered. Decision-making details documented in ED Course. Radiology: ordered. Decision-making details documented in ED Course.    This patient presents to the ED for concern of unresponsiveness, this involves an extensive number of treatment options, and is a complaint that carries with it a high risk of complications and morbidity.  I considered the following differential and admission for this acute, potentially life threatening condition.   MDM:    Given the acute onset of neurological symptoms, stroke is a concerning  etiology of these acute symptoms. The neuro exam is significant for patient's baseline left hemiparesis and partial gaze palsy with partial facial droop. She is disoriented to time and situation but this is her baseline as well. Also consider seizure activity as a possible cause with left gaze deviation and h/o seizures, however her seizures are focal and she hasn't missed any vimpat.    Initially patient had some dysarthria however likely d/t AMS and on my assessment later patient with no dysarthria or aphasia.   LKN 1618 pm Not a candidate for TNK as she is currently at her baseline.  Ddx of acute altered mental status or encephalopathy considered but not limited  to: -Intracranial abnormalities such as ICH, hydrocephalus, head trauma -Infection such as UTI, PNA, or meningitis -Toxic ingestion such as opioid overdose, anticholinergic toxicity, -Electrolyte abnormalities or hyper/hypoglycemia -Hypercarbia or hypoxia -Hepatic encephalopathy or uremia -ACS or arrhythmia -Endocrine abnormality such as thyroid storm or myxedema coma   Clinical Course as of 05/26/23 2353  Wed May 25, 2023  1810 Glucose-Capillary(!): 117 Unremarkable in the context of this patient's presentation  [HN]  1810 CT HEAD CODE STROKE WO CONTRAST NAICP. Old stroke noted. [HN]  [id number] Per neurology Dr. Iver Nestle: Impression: Most likely recrudescence of prior stroke symptoms in the setting of dialysis. Family expresses a preference to have the patient discharged if she is at her baseline and given her dementia, risks of hospitalization are substantial (risk of delirium) Recommendations: -Toxic/metabolic clearance per ED -If patient's family confirms she is at baseline, from a neurological perspective appropriate for outpatient follow-up -Continue home aspirin 81 mg -Continue home Vimpat 100 mg twice daily -Goal normotension, avoid hypotension -Neurology will be available as needed going forward, please reach out with any  additional questions or concerns   [HN]  1848 Alcohol, Ethyl (B): <10 neg [HN]  1849 Calcium Ionized(!!): 0.76 Wille val chem on chemistry panel [HN]  1849 Calcium: 9.2 wnl [HN]  1849 Creatinine(!): 4.26 HD patient, at baseline [HN]  2124 Called and discussed with patient's son who states that this has happened before when patient has been at dialysis.  [HN]    Clinical Course User Index [HN] Loetta Rough, MD    Labs: I Ordered, and personally interpreted labs.  The pertinent results include:  ***  Imaging Studies ordered: I ordered imaging studies including *** I independently visualized and interpreted imaging. I agree with the radiologist interpretation  Additional history obtained from ***.  External records from outside source obtained and reviewed including ***  Cardiac Monitoring: .The patient was maintained on a cardiac monitor.  I personally viewed and interpreted the cardiac monitored which showed an underlying rhythm of: ***  Reevaluation: After the interventions noted above, I reevaluated the patient and found that they have :{resolved/improved/worsened:23923::"improved"}  Social Determinants of Health: .***  Disposition:  ***  Co morbidities that complicate the patient evaluation . Past Medical History:  Diagnosis Date  . Cancer [facility]) 2017   Right breast  . Chronic kidney disease    progression to ESRD 04/10/21  . GERD (gastroesophageal reflux disease)   . Glaucoma   . Hemiparesis (HCC)    left side  . High cholesterol   . History of seizure    after a spider bite; 07/15/21  . History of stroke with residual deficit    left-side weakness  . Hypertension    states BP under control with meds., has been on med. x 2 yr.  . Hypothyroidism   . Non-insulin dependent type 2 diabetes mellitus (HCC)   . Overactive bladder   . PFO (patent foramen ovale) 05/15/2021  . Stroke [facility])    1998 weakness on left side; 05/12/21     Medicines Meds ordered this  encounter  Medications  . sodium chloride flush (NS) 0.9 % injection 3 mL    I have reviewed the patients home medicines and have made adjustments as needed  Problem List / ED Course: Problem List Items Addressed This Visit   None        {Document critical care time when appropriate:1} {Document review of labs and clinical decision tools ie heart score, Chads2Vasc2 etc:1}  {Document your independent  review of radiology images, and any outside records:1} {Document your discussion with family members, caretakers, and with consultants:1} {Document social determinants of health affecting pt's care:1} {Document your decision making why or why not admission, treatments were needed:1}  This note was created using dictation software, which may contain spelling or grammatical errors.

## 2023-05-30 ENCOUNTER — Emergency Department: Payer: Medicare (Managed Care)

## 2023-05-30 ENCOUNTER — Encounter (HOSPITAL_COMMUNITY): Payer: Self-pay

## 2023-05-30 ENCOUNTER — Other Ambulatory Visit: Payer: Self-pay

## 2023-05-30 ENCOUNTER — Emergency Department
Admission: EM | Admit: 2023-05-30 | Discharge: 2023-05-31 | Disposition: A | Discharge status: Home / Self Care | Payer: Medicare (Managed Care) | Attending: Emergency Medicine | Admitting: Emergency Medicine

## 2023-05-30 DIAGNOSIS — R569 Unspecified convulsions: Secondary | ICD-10-CM | POA: Diagnosis present

## 2023-05-30 DIAGNOSIS — Z992 Dependence on renal dialysis: Secondary | ICD-10-CM | POA: Diagnosis not present

## 2023-05-30 DIAGNOSIS — Z8673 Personal history of transient ischemic attack (TIA), and cerebral infarction without residual deficits: Secondary | ICD-10-CM | POA: Insufficient documentation

## 2023-05-30 DIAGNOSIS — I120 Hypertensive chronic kidney disease with stage 5 chronic kidney disease or end stage renal disease: Secondary | ICD-10-CM | POA: Insufficient documentation

## 2023-05-30 DIAGNOSIS — N186 End stage renal disease: Secondary | ICD-10-CM | POA: Diagnosis not present

## 2023-05-30 DIAGNOSIS — Z853 Personal history of malignant neoplasm of breast: Secondary | ICD-10-CM | POA: Diagnosis not present

## 2023-05-30 DIAGNOSIS — E1122 Type 2 diabetes mellitus with diabetic chronic kidney disease: Secondary | ICD-10-CM | POA: Diagnosis not present

## 2023-05-30 DIAGNOSIS — E039 Hypothyroidism, unspecified: Secondary | ICD-10-CM | POA: Insufficient documentation

## 2023-05-30 LAB — BASIC METABOLIC PANEL
Anion gap: 18 — ABNORMAL HIGH (ref 5–15)
BUN: 38 mg/dL — ABNORMAL HIGH (ref 8–23)
CO2: 29 mmol/L (ref 22–32)
Calcium: 9.1 mg/dL (ref 8.9–10.3)
Chloride: 97 mmol/L — ABNORMAL LOW (ref 98–111)
Creatinine, Ser: 8.03 mg/dL — ABNORMAL HIGH (ref 0.44–1.00)
GFR, Estimated: 5 mL/min — ABNORMAL LOW (ref >60)
Glucose, Bld: 146 mg/dL — ABNORMAL HIGH (ref 70–99)
Potassium: 3.5 mmol/L (ref 3.5–5.1)
Sodium: 144 mmol/L (ref 135–145)

## 2023-05-30 LAB — CBC WITH DIFFERENTIAL/PLATELET
Abs Immature Granulocytes: 0.08 10*3/uL — ABNORMAL HIGH (ref 0.00–0.07)
Basophils Absolute: 0 10*3/uL (ref 0.0–0.1)
Basophils Relative: 0 %
Eosinophils Absolute: 0.1 10*3/uL (ref 0.0–0.5)
Eosinophils Relative: 1 %
HCT: 36.3 % (ref 36.0–46.0)
Hemoglobin: 11.5 g/dL — ABNORMAL LOW (ref 12.0–15.0)
Immature Granulocytes: 1 %
Lymphocytes Relative: 12 %
Lymphs Abs: 1.4 10*3/uL (ref 0.7–4.0)
MCH: 32.7 pg (ref 26.0–34.0)
MCHC: 31.7 g/dL (ref 30.0–36.0)
MCV: 103.1 fL — ABNORMAL HIGH (ref 80.0–100.0)
Monocytes Absolute: 0.5 10*3/uL (ref 0.1–1.0)
Monocytes Relative: 5 %
Neutro Abs: 9 10*3/uL — ABNORMAL HIGH (ref 1.7–7.7)
Neutrophils Relative %: 81 %
Platelets: 217 10*3/uL (ref 150–400)
RBC: 3.52 MIL/uL — ABNORMAL LOW (ref 3.87–5.11)
RDW: 14.2 % (ref 11.5–15.5)
WBC: 11.1 10*3/uL — ABNORMAL HIGH (ref 4.0–10.5)
nRBC: 0 % (ref 0.0–0.2)

## 2023-05-30 MED ORDER — LACOSAMIDE 50 MG PO TABS
100.0000 mg | ORAL_TABLET | Freq: Two times a day (BID) | ORAL | Status: DC
  Administered 2023-05-30 – 2023-05-31 (×3): 100 mg via ORAL
  Filled 2023-05-30 (×3): qty 2

## 2023-05-30 MED ORDER — LATANOPROST 0.005 % OP SOLN
1.0000 [drp] | Freq: Every day | OPHTHALMIC | Status: DC
  Filled 2023-05-30: qty 2.5

## 2023-05-30 MED ORDER — LACOSAMIDE 100 MG PO TABS: 100.0000 mg | ORAL_TABLET | Freq: Two times a day (BID) | ORAL | Status: DC

## 2023-05-30 MED ORDER — ANASTROZOLE 1 MG PO TABS
1.0000 mg | ORAL_TABLET | Freq: Every day | ORAL | Status: DC
  Administered 2023-05-31: 1 mg via ORAL
  Filled 2023-05-30: qty 1

## 2023-05-30 NOTE — ED Notes (Signed)
Pt said she did not have to go the bathroom or needed water to drink at this time

## 2023-05-30 NOTE — ED Notes (Signed)
Pt sitting up and drinking water. More alert at this time. Stated she felt "pretty good" but repeated this twice with long pause in between.

## 2023-05-30 NOTE — ED Notes (Signed)
Daughter updated about possible transfer to [facility]. Daughter agreeable to plan as pt lives in [place]. Daughter stated she will update pt's son

## 2023-05-30 NOTE — ED Triage Notes (Signed)
Pt into one hour of dialysis treatment when she had a tonic-clonic seizure for about one minute. Hx of seizure, unsure of pt compliance with seizure medications. No seizure activity with EMS.  EMS vitals: HR 90 NSR BP 165/88

## 2023-05-30 NOTE — ED Notes (Signed)
Pt's daughter updated. She stated she lives with pt's son. Daughter gave her seizure medication today and that she has been compliant with taking it. Daughter states they kept pt home from dialysis on Friday.

## 2023-05-30 NOTE — Progress Notes (Signed)
HD needles pulled. No bleeding.

## 2023-05-30 NOTE — Progress Notes (Signed)
[facility]  ROUNDING NOTE   Subjective:   Ms. NERY KALISZ was admitted to [facility] on 05/30/2023 for sz,ems  Last hemodialysis treatment was today, she received 1.5 hours of treatment before having a seizure.   Patient was in the ED at [facility] on 6/19 for similar presentation.   Neurology evaluated patient and recommended to continue Vimpat.   She is currently living with her daughter in [place]. Most of the time, she lives with her son in [place].    Objective:  Vital signs in last 24 hours:  Temp:  [98 F (36.7 C)] 98 F (36.7 C) (06/24 1451) Pulse Rate:  [80-91] 80 (06/24 1600) Resp:  [11-25] 25 (06/24 1600) BP: (136-139)/(64-75) 139/69 (06/24 1600) SpO2:  [95 %-100 %] 99 % (06/24 1600) Weight:  [63.5 kg] 63.5 kg (06/24 1441)  Weight change:  Filed Weights   05/30/23 1441  Weight: 63.5 kg    Intake/Output: No intake/output data recorded.   Intake/Output this shift:  No intake/output data recorded.  Physical Exam: General: NAD, laying on stretcher  Head: Normocephalic, atraumatic. Moist oral mucosal membranes  Eyes: Anicteric, PERRL  Neck: Supple, trachea midline  Lungs:  Clear to auscultation  Heart: Regular rate and rhythm  Abdomen:  Soft, nontender,   Extremities:  trace peripheral edema.  Neurologic: Tired, alert to self only  Skin: No lesions  Access: Left AVF    Basic Metabolic Panel: Recent Labs  Lab 05/25/23 1713 05/25/23 1732 05/30/23 1524  NA 133* 137 144  K 3.9 3.2* 3.5  CL 99 91* 97*  CO2  --  29 29  GLUCOSE 127* 126* 146*  BUN 15 11 38*  CREATININE 4.00* 4.26* 8.03*  CALCIUM  --  9.2 9.1    Liver Function Tests: Recent Labs  Lab 05/25/23 1732  AST 25  ALT 18  ALKPHOS 73  BILITOT 0.4  PROT 8.0  ALBUMIN 3.6   No results for input(s): "LIPASE", "AMYLASE" in the last 168 hours. No results for input(s): "AMMONIA" in the last 168 hours.  CBC: Recent Labs  Lab 05/25/23 1713 05/25/23 1732  05/30/23 1524  WBC  --  8.3 11.1*  NEUTROABS  --  5.8 9.0*  HGB 13.3 11.8* 11.5*  HCT 39.0 37.3 36.3  MCV  --  102.2* 103.1*  PLT  --  225 217    Cardiac Enzymes: No results for input(s): "CKTOTAL", "CKMB", "CKMBINDEX", "TROPONINI" in the last 168 hours.  BNP: Invalid input(s): "POCBNP"  CBG: Recent Labs  Lab 05/25/23 1709  GLUCAP 117*    Microbiology: Results for orders placed or performed during the hospital encounter of 12/27/22  Resp panel by RT-PCR (RSV, Flu A&B, Covid) Anterior Nasal Swab     Status: Abnormal   Collection Time: 12/27/22  3:50 PM   Specimen: Anterior Nasal Swab  Result Value Ref Range Status   SARS Coronavirus 2 by RT PCR POSITIVE (A) NEGATIVE Final    Comment: (NOTE) SARS-CoV-2 target nucleic acids are DETECTED.  The SARS-CoV-2 RNA is generally detectable in upper respiratory specimens during the acute phase of infection. Positive results are indicative of the presence of the identified virus, but do not rule out bacterial infection or co-infection with other pathogens not detected by the test. Clinical correlation with patient history and other diagnostic information is necessary to determine patient infection status. The expected result is Negative.  Fact Sheet for Patients: BloggerCourse.com  Fact Sheet for Healthcare Providers: SeriousBroker.it  This test is not  yet approved or cleared by the [place] and  has been authorized for detection and/or diagnosis of SARS-CoV-2 by FDA under an Emergency Use Authorization (EUA).  This EUA will remain in effect (meaning this test can be used) for the duration of  the COVID-19 declaration under Section 564(b)(1) of the A ct, 21 U.S.C. section 360bbb-3(b)(1), unless the authorization is terminated or revoked sooner.     Influenza A by PCR NEGATIVE NEGATIVE Final   Influenza B by PCR NEGATIVE NEGATIVE Final    Comment: (NOTE) The Xpert  Xpress SARS-CoV-2/FLU/RSV plus assay is intended as an aid in the diagnosis of influenza from Nasopharyngeal swab specimens and should not be used as a sole basis for treatment. Nasal washings and aspirates are unacceptable for Xpert Xpress SARS-CoV-2/FLU/RSV testing.  Fact Sheet for Patients: BloggerCourse.com  Fact Sheet for Healthcare Providers: SeriousBroker.it  This test is not yet approved or cleared by the [place] FDA and has been authorized for detection and/or diagnosis of SARS-CoV-2 by FDA under an Emergency Use Authorization (EUA). This EUA will remain in effect (meaning this test can be used) for the duration of the COVID-19 declaration under Section 564(b)(1) of the Act, 21 U.S.C. section 360bbb-3(b)(1), unless the authorization is terminated or revoked.     Resp Syncytial Virus by PCR NEGATIVE NEGATIVE Final    Comment: (NOTE) Fact Sheet for Patients: BloggerCourse.com  Fact Sheet for Healthcare Providers: SeriousBroker.it  This test is not yet approved or cleared by the [place] FDA and has been authorized for detection and/or diagnosis of SARS-CoV-2 by FDA under an Emergency Use Authorization (EUA). This EUA will remain in effect (meaning this test can be used) for the duration of the COVID-19 declaration under Section 564(b)(1) of the Act, 21 U.S.C. section 360bbb-3(b)(1), unless the authorization is terminated or revoked.  Performed at [facility], [address], [place], [place] [address]     Coagulation Studies: No results for input(s): "LABPROT", "INR" in the last 72 hours.  Urinalysis: No results for input(s): "COLORURINE", "LABSPEC", "PHURINE", "GLUCOSEU", "HGBUR", "BILIRUBINUR", "KETONESUR", "PROTEINUR", "UROBILINOGEN", "NITRITE", "LEUKOCYTESUR" in the last 72 hours.  Invalid input(s): "APPERANCEUR"    Imaging: CT HEAD WO  CONTRAST ( )  Result Date: 05/30/2023 CLINICAL DATA:  New onset seizure.  Dialysis patient. EXAM: CT HEAD WITHOUT CONTRAST TECHNIQUE: Contiguous axial images were obtained from the base of the skull through the vertex without intravenous contrast. RADIATION DOSE REDUCTION: This exam was performed according to the departmental dose-optimization program which includes automated exposure control, adjustment of the mA and/or kV according to patient size and/or use of iterative reconstruction technique. COMPARISON:  l 05/25/2023 FINDINGS: Brain: Distant right frontal craniectomy. Old small vessel infarctions of the cerebellum. Old infarction in the right middle cerebral artery territory affecting the insular region and frontal operculum. Old infarction in the right occipital lobe. No CT evidence of acute infarction, mass, hemorrhage, hydrocephalus or extra-axial collection. Vascular: There is atherosclerotic calcification of the major vessels at the base of the brain. Skull: Otherwise negative. Sinuses/Orbits: Clear/normal Other: None IMPRESSION: No acute finding by CT. Old infarctions in the right middle cerebral artery territory, right occipital lobe and cerebellum. Distant right frontal craniectomy. Electronically Signed   By: Paulina Fusi M.D.   On: 05/30/2023 16:30   DG Chest Portable 1 View  Result Date: 05/30/2023 CLINICAL DATA:  Weakness, edema EXAM: PORTABLE CHEST 1 VIEW COMPARISON:  12/27/2022 FINDINGS: Single frontal view of the chest demonstrates stable enlargement of the cardiac  silhouette and ectasia of the thoracic aorta. There is increased central vascular congestion with mild bilateral infrahilar airspace disease. Trace left effusion. No pneumothorax. IMPRESSION: 1. Findings consistent with mild congestive heart failure. Electronically Signed   By: Sharlet Salina M.D.   On: 05/30/2023 15:59     Medications:     lacosamide  100 mg Oral BID     Assessment/ Plan:  Ms. EVONA WESTRA is  a 77 y.o.  female with end stage renal disease on hemodialysis, seizure disorder, glaucoma, CVA with left sided hemiperesis, hyperlipidemia, diabetes mellitus type II, hypertension, who is admitted to [facility] on 05/30/2023 for seizure.   [facility] MWF [address] Left AVF 59.3kg   End Stage Renal Disease: on hemodialysis. Completed 1.5 hours today. Patient does have some volume overload.   Seizure disorder: home regimen of lacosamide which is 50% dialyzed. She may need post HD treatment dosage.   Hypertension with chronic kidney disease: home regimen of metoprolol  Secondary Hyperparathyroidism: Continue velphoro for phosphate binding.   Anemia of chronic kidney disease: Macrocytic. Mircera as outpatient. Hold ESA due to seizure and possible ischemic event.    LOS: 0 Pamella Samons 6/24/20245:20 PM

## 2023-05-30 NOTE — ED Provider Notes (Signed)
[facility] Provider Note    Event Date/Time   First MD Initiated Contact with Patient 05/30/23 1455     (approximate)   History   Seizures   HPI  Kirsten Mcmahon is a 77 y.o. female extensive past medical history including CVA, ESRD on dialysis as well as history of seizures on Vimpat presents from dialysis after event where she was reportedly had tonic-clonic seizure for about 1 minute.  This is similar to her previous history of seizures.  She currently states that she feels "good.  "Denies any pain.  Patient states that she has been compliant with her medications.  Denies any chest pain.  Did not bite her tongue.  No headache.     Physical Exam   Triage Vital Signs: ED Triage Vitals  Enc Vitals Group     BP 05/30/23 1440 139/71     Pulse Rate 05/30/23 1440 91     Resp 05/30/23 1445 20     Temp 05/30/23 1451 98 F (36.7 C)     Temp Source 05/30/23 1451 Oral     SpO2 05/30/23 1440 95 %     Weight 05/30/23 1441 140 lb 1.6 oz (63.5 kg)     Height 05/30/23 1441 5\' 2"  (1.575 m)     Head Circumference --      Peak Flow --      Pain Score 05/30/23 1441 0     Pain Loc --      Pain Edu? --      Excl. in GC? --     Most recent vital signs: Vitals:   05/30/23 2100 05/30/23 2226  BP: 129/67 118/63  Pulse: 80 77  Resp: 18 (!) 21  Temp:  97.9 F (36.6 C)  SpO2: 99% 100%     Constitutional: Alert  Eyes: Conjunctivae are normal.  Head: Atraumatic. Nose: No congestion/rhinnorhea. Mouth/Throat: Mucous membranes are moist.   Neck: Painless ROM.  Cardiovascular:   Good peripheral circulation. Respiratory: Normal respiratory effort.  No retractions.  Gastrointestinal: Soft and nontender.  Musculoskeletal:  no deformity Neurologic:  no new focal deficits,  answering appropriately, chronic left hemiparesis Skin:  Skin is warm, dry and intact. No rash noted. Psychiatric: Mood and affect are normal. Speech and behavior are normal.    ED  Results / Procedures / Treatments   Labs (all labs ordered are listed, but only abnormal results are displayed) Labs Reviewed  CBC WITH DIFFERENTIAL/PLATELET - Abnormal; Notable for the following components:      Result Value   WBC 11.1 (*)    RBC 3.52 (*)    Hemoglobin 11.5 (*)    MCV 103.1 (*)    Neutro Abs 9.0 (*)    Abs Immature Granulocytes 0.08 (*)    All other components within normal limits  BASIC METABOLIC PANEL - Abnormal; Notable for the following components:   Chloride 97 (*)    Glucose, Bld 146 (*)    BUN 38 (*)    Creatinine, Ser 8.03 (*)    GFR, Estimated 5 (*)    Anion gap 18 (*)    All other components within normal limits  HEPATITIS B SURFACE ANTIGEN  HEPATITIS B SURFACE ANTIBODY, QUANTITATIVE  RENAL FUNCTION PANEL  CBC     EKG  ED ECG REPORT I, Willy Eddy, the attending physician, personally viewed and interpreted this ECG.   Date: 05/30/2023  EKG Time: 14:47  Rate: 85  Rhythm: sinus  Axis: normal  Intervals: normal  ST&T Change: no stemi    RADIOLOGY Please see ED Course for my review and interpretation.  I personally reviewed all radiographic images ordered to evaluate for the above acute complaints and reviewed radiology reports and findings.  These findings were personally discussed with the patient.  Please see medical record for radiology report.    PROCEDURES:  Critical Care performed:   Procedures   MEDICATIONS ORDERED IN ED: Medications  lacosamide (VIMPAT) tablet 100 mg (100 mg Oral Given 05/30/23 2224)  anastrozole (ARIMIDEX) tablet 1 mg (has no administration in time range)  latanoprost (XALATAN) 0.005 % ophthalmic solution 1 drop (has no administration in time range)     IMPRESSION / MDM / ASSESSMENT AND PLAN / ED COURSE  I reviewed the triage vital signs and the nursing notes.                              Differential diagnosis includes, but is not limited to, seizure, dysrhythmia, electrolyte abn,  hypotension, dehydration, cva, iph  Patient presenting to the ER for evaluation of symptoms as described above.  Based on symptoms, risk factors and considered above differential, this presenting complaint could reflect a potentially life-threatening illness therefore the patient will be placed on continuous pulse oximetry and telemetry for monitoring.  Laboratory evaluation will be sent to evaluate for the above complaints.  Imaging as well as CT head will be ordered for the above differential.    Clinical Course as of 05/30/23 2324  Mon May 30, 2023  1553 Chest x-ray my review and interpretation without evidence of pneumothorax.  Will await formal radiology report. [PR]  1633 CT imaging with no acute abnormality.  As this has happened twice recently during dialysis and family kept her home from dialysis on Friday I will consult with nephrology for further recommendations.  She remains hemodynamically stable appears that she is at her baseline currently. [PR]  1641 Dr. Wynelle Link of nephrology is evaluating patient at bedside. [PR]  1752 Had discussion in consultation with Dr. Wilford Corner of neurology.  Given these increasing burden of events at dialysis has recommended transfer to [facility] for long-term EEG monitoring to better characterize truly what these episodes are.   Discussed case in consultation with hospitalist at [facility] via carelink.  Patient accepted to their facility. [PR]    Clinical Course User Index [PR] Willy Eddy, MD     FINAL CLINICAL IMPRESSION(S) / ED DIAGNOSES   Final diagnoses:  Seizure-like activity (HCC)  ESRD on dialysis [facility])     Rx / DC Orders   ED Discharge Orders     None        Note:  This document was prepared using Dragon voice recognition software and may include unintentional dictation errors.    Willy Eddy, MD 05/30/23 [phone]

## 2023-05-31 ENCOUNTER — Inpatient Hospital Stay (HOSPITAL_COMMUNITY): Payer: Medicare (Managed Care)

## 2023-05-31 ENCOUNTER — Encounter (HOSPITAL_COMMUNITY): Payer: Self-pay | Admitting: Family Medicine

## 2023-05-31 ENCOUNTER — Inpatient Hospital Stay (HOSPITAL_COMMUNITY)
Admit: 2023-05-31 | Discharge: 2023-06-06 | DRG: 100 | Disposition: A | Discharge status: Home Health | Payer: Medicare (Managed Care) | Source: Other Acute Inpatient Hospital | Attending: Internal Medicine | Admitting: Internal Medicine

## 2023-05-31 DIAGNOSIS — N186 End stage renal disease: Secondary | ICD-10-CM | POA: Diagnosis not present

## 2023-05-31 DIAGNOSIS — E039 Hypothyroidism, unspecified: Secondary | ICD-10-CM | POA: Diagnosis not present

## 2023-05-31 DIAGNOSIS — Q2112 Patent foramen ovale: Secondary | ICD-10-CM | POA: Diagnosis not present

## 2023-05-31 DIAGNOSIS — R443 Hallucinations, unspecified: Secondary | ICD-10-CM | POA: Diagnosis present

## 2023-05-31 DIAGNOSIS — N2581 Secondary hyperparathyroidism of renal origin: Secondary | ICD-10-CM | POA: Diagnosis present

## 2023-05-31 DIAGNOSIS — C50411 Malignant neoplasm of upper-outer quadrant of right female breast: Secondary | ICD-10-CM | POA: Diagnosis present

## 2023-05-31 DIAGNOSIS — E871 Hypo-osmolality and hyponatremia: Secondary | ICD-10-CM | POA: Diagnosis present

## 2023-05-31 DIAGNOSIS — H409 Unspecified glaucoma: Secondary | ICD-10-CM | POA: Diagnosis present

## 2023-05-31 DIAGNOSIS — Z853 Personal history of malignant neoplasm of breast: Secondary | ICD-10-CM

## 2023-05-31 DIAGNOSIS — R32 Unspecified urinary incontinence: Secondary | ICD-10-CM | POA: Diagnosis present

## 2023-05-31 DIAGNOSIS — R569 Unspecified convulsions: Secondary | ICD-10-CM

## 2023-05-31 DIAGNOSIS — Z79811 Long term (current) use of aromatase inhibitors: Secondary | ICD-10-CM | POA: Diagnosis not present

## 2023-05-31 DIAGNOSIS — I120 Hypertensive chronic kidney disease with stage 5 chronic kidney disease or end stage renal disease: Secondary | ICD-10-CM | POA: Diagnosis present

## 2023-05-31 DIAGNOSIS — Z79890 Hormone replacement therapy (postmenopausal): Secondary | ICD-10-CM | POA: Diagnosis not present

## 2023-05-31 DIAGNOSIS — R112 Nausea with vomiting, unspecified: Secondary | ICD-10-CM | POA: Diagnosis present

## 2023-05-31 DIAGNOSIS — D631 Anemia in chronic kidney disease: Secondary | ICD-10-CM | POA: Diagnosis present

## 2023-05-31 DIAGNOSIS — G40909 Epilepsy, unspecified, not intractable, without status epilepticus: Secondary | ICD-10-CM | POA: Diagnosis not present

## 2023-05-31 DIAGNOSIS — I10 Essential (primary) hypertension: Secondary | ICD-10-CM | POA: Diagnosis present

## 2023-05-31 DIAGNOSIS — Z7984 Long term (current) use of oral hypoglycemic drugs: Secondary | ICD-10-CM

## 2023-05-31 DIAGNOSIS — F0390 Unspecified dementia without behavioral disturbance: Secondary | ICD-10-CM | POA: Diagnosis present

## 2023-05-31 DIAGNOSIS — E876 Hypokalemia: Secondary | ICD-10-CM | POA: Diagnosis present

## 2023-05-31 DIAGNOSIS — I69354 Hemiplegia and hemiparesis following cerebral infarction affecting left non-dominant side: Secondary | ICD-10-CM

## 2023-05-31 DIAGNOSIS — D7589 Other specified diseases of blood and blood-forming organs: Secondary | ICD-10-CM | POA: Diagnosis present

## 2023-05-31 DIAGNOSIS — Z91041 Radiographic dye allergy status: Secondary | ICD-10-CM

## 2023-05-31 DIAGNOSIS — Z992 Dependence on renal dialysis: Secondary | ICD-10-CM

## 2023-05-31 DIAGNOSIS — E7800 Pure hypercholesterolemia, unspecified: Secondary | ICD-10-CM | POA: Diagnosis present

## 2023-05-31 DIAGNOSIS — Z91040 Latex allergy status: Secondary | ICD-10-CM

## 2023-05-31 DIAGNOSIS — Z8673 Personal history of transient ischemic attack (TIA), and cerebral infarction without residual deficits: Secondary | ICD-10-CM

## 2023-05-31 DIAGNOSIS — E8770 Fluid overload, unspecified: Secondary | ICD-10-CM | POA: Diagnosis present

## 2023-05-31 DIAGNOSIS — Z66 Do not resuscitate: Secondary | ICD-10-CM | POA: Diagnosis present

## 2023-05-31 DIAGNOSIS — Z91018 Allergy to other foods: Secondary | ICD-10-CM

## 2023-05-31 DIAGNOSIS — K5900 Constipation, unspecified: Secondary | ICD-10-CM | POA: Diagnosis present

## 2023-05-31 DIAGNOSIS — E11649 Type 2 diabetes mellitus with hypoglycemia without coma: Secondary | ICD-10-CM | POA: Diagnosis present

## 2023-05-31 DIAGNOSIS — E878 Other disorders of electrolyte and fluid balance, not elsewhere classified: Secondary | ICD-10-CM | POA: Diagnosis present

## 2023-05-31 DIAGNOSIS — E1122 Type 2 diabetes mellitus with diabetic chronic kidney disease: Secondary | ICD-10-CM | POA: Diagnosis present

## 2023-05-31 DIAGNOSIS — Z170 Estrogen receptor positive status [ER+]: Secondary | ICD-10-CM

## 2023-05-31 DIAGNOSIS — Z961 Presence of intraocular lens: Secondary | ICD-10-CM | POA: Diagnosis present

## 2023-05-31 DIAGNOSIS — K219 Gastro-esophageal reflux disease without esophagitis: Secondary | ICD-10-CM | POA: Diagnosis present

## 2023-05-31 DIAGNOSIS — Z90710 Acquired absence of both cervix and uterus: Secondary | ICD-10-CM

## 2023-05-31 DIAGNOSIS — Z9842 Cataract extraction status, left eye: Secondary | ICD-10-CM

## 2023-05-31 DIAGNOSIS — Z79899 Other long term (current) drug therapy: Secondary | ICD-10-CM

## 2023-05-31 DIAGNOSIS — Z7982 Long term (current) use of aspirin: Secondary | ICD-10-CM

## 2023-05-31 DIAGNOSIS — Z888 Allergy status to other drugs, medicaments and biological substances status: Secondary | ICD-10-CM

## 2023-05-31 LAB — RENAL FUNCTION PANEL
Albumin: 3.5 g/dL (ref 3.5–5.0)
Anion gap: 15 (ref 5–15)
BUN: 46 mg/dL — ABNORMAL HIGH (ref 8–23)
CO2: 28 mmol/L (ref 22–32)
Calcium: 8.3 mg/dL — ABNORMAL LOW (ref 8.9–10.3)
Chloride: 96 mmol/L — ABNORMAL LOW (ref 98–111)
Creatinine, Ser: 9.41 mg/dL — ABNORMAL HIGH (ref 0.44–1.00)
GFR, Estimated: 4 mL/min — ABNORMAL LOW (ref >60)
Glucose, Bld: 98 mg/dL (ref 70–99)
Phosphorus: 5.8 mg/dL — ABNORMAL HIGH (ref 2.5–4.6)
Potassium: 3.2 mmol/L — ABNORMAL LOW (ref 3.5–5.1)
Sodium: 139 mmol/L (ref 135–145)

## 2023-05-31 LAB — HEPATITIS B SURFACE ANTIGEN: Hepatitis B Surface Ag: NONREACTIVE

## 2023-05-31 LAB — MAGNESIUM: Magnesium: 2.4 mg/dL (ref 1.7–2.4)

## 2023-05-31 LAB — CBC
HCT: 34.8 % — ABNORMAL LOW (ref 36.0–46.0)
Hemoglobin: 11.1 g/dL — ABNORMAL LOW (ref 12.0–15.0)
MCH: 32.4 pg (ref 26.0–34.0)
MCHC: 31.9 g/dL (ref 30.0–36.0)
MCV: 101.5 fL — ABNORMAL HIGH (ref 80.0–100.0)
Platelets: 207 10*3/uL (ref 150–400)
RBC: 3.43 MIL/uL — ABNORMAL LOW (ref 3.87–5.11)
RDW: 13.9 % (ref 11.5–15.5)
WBC: 7.9 10*3/uL (ref 4.0–10.5)
nRBC: 0 % (ref 0.0–0.2)

## 2023-05-31 MED ORDER — CHLORHEXIDINE GLUCONATE 0.12% ORAL RINSE (MEDLINE KIT)
15.0000 mL | Freq: Two times a day (BID) | OROMUCOSAL | Status: DC
  Administered 2023-05-31 – 2023-06-04 (×7): 15 mL via OROMUCOSAL

## 2023-05-31 MED ORDER — ONDANSETRON HCL 4 MG/2ML IJ SOLN
4.0000 mg | Freq: Four times a day (QID) | INTRAMUSCULAR | Status: DC | PRN
  Administered 2023-06-05: 4 mg via INTRAVENOUS
  Filled 2023-05-31: qty 2

## 2023-05-31 MED ORDER — HEPARIN SODIUM (PORCINE) 5000 UNIT/ML IJ SOLN
5000.0000 [IU] | Freq: Three times a day (TID) | INTRAMUSCULAR | Status: DC
  Administered 2023-05-31 – 2023-06-01 (×2): 5000 [IU] via SUBCUTANEOUS
  Filled 2023-05-31 (×2): qty 1

## 2023-05-31 MED ORDER — ACETAMINOPHEN 325 MG PO TABS: 650.0000 mg | ORAL_TABLET | Freq: Four times a day (QID) | ORAL | Status: DC | PRN

## 2023-05-31 MED ORDER — ENOXAPARIN SODIUM 30 MG/0.3ML IJ SOSY: 30.0000 mg | PREFILLED_SYRINGE | INTRAMUSCULAR | Status: DC

## 2023-05-31 MED ORDER — TRAZODONE HCL 50 MG PO TABS
25.0000 mg | ORAL_TABLET | Freq: Every evening | ORAL | Status: DC | PRN
  Administered 2023-05-31: 25 mg via ORAL
  Filled 2023-05-31: qty 1

## 2023-05-31 MED ORDER — POTASSIUM CHLORIDE 20 MEQ PO PACK
40.0000 meq | PACK | Freq: Once | ORAL | Status: AC
  Administered 2023-05-31: 40 meq via ORAL
  Filled 2023-05-31: qty 2

## 2023-05-31 MED ORDER — ORAL CARE MOUTH RINSE: 15.0000 mL | OROMUCOSAL | Status: DC

## 2023-05-31 MED ORDER — SODIUM CHLORIDE 0.9 % IV SOLN: INTRAVENOUS | Status: DC

## 2023-05-31 MED ORDER — MAGNESIUM HYDROXIDE 400 MG/5ML PO SUSP
30.0000 mL | Freq: Every day | ORAL | Status: DC | PRN
Start: 2023-05-31 — End: 2023-05-31

## 2023-05-31 MED ORDER — ACETAMINOPHEN 650 MG RE SUPP: 650.0000 mg | Freq: Four times a day (QID) | RECTAL | Status: DC | PRN

## 2023-05-31 MED ORDER — DOCUSATE SODIUM 100 MG PO CAPS: 100.0000 mg | ORAL_CAPSULE | Freq: Two times a day (BID) | ORAL | Status: DC | PRN

## 2023-05-31 MED ORDER — LORAZEPAM 2 MG/ML IJ SOLN
2.0000 mg | INTRAMUSCULAR | Status: DC | PRN
  Administered 2023-06-01: 2 mg via INTRAVENOUS
  Filled 2023-05-31: qty 1

## 2023-05-31 MED ORDER — ONDANSETRON HCL 4 MG PO TABS
4.0000 mg | ORAL_TABLET | Freq: Four times a day (QID) | ORAL | Status: DC | PRN
  Administered 2023-06-06: 4 mg via ORAL
  Filled 2023-05-31: qty 1

## 2023-05-31 NOTE — ED Notes (Signed)
Patient denies pain and is resting comfortably.  

## 2023-05-31 NOTE — Assessment & Plan Note (Signed)
-   We will continue Toprol-XL. 

## 2023-05-31 NOTE — Assessment & Plan Note (Signed)
-   We will continue aspirin. 

## 2023-05-31 NOTE — Progress Notes (Signed)
LTM EEG hooked up and running - no initial skin breakdown - push button tested - [facility] monitoring.  

## 2023-05-31 NOTE — H&P (Signed)
[place]   PATIENT NAME: Kirsten Mcmahon    MR#:  [id number]  DATE OF BIRTH:  September 03, 1946  DATE OF ADMISSION:  05/31/2023  PRIMARY CARE PHYSICIAN: [employer]   Patient is coming from: [facility] ED--> [facility]  REQUESTING/REFERRING PHYSICIAN: Minna Antis, MD [facility] ED)  CHIEF COMPLAINT:  Seizures  HISTORY OF PRESENT ILLNESS:  Kirsten Mcmahon is a 77 y.o. African-American female with medical history significant for recurrent breast cancer, ESRD on hemodialysis, left hemiplegia from a prior right MCA stroke, dyslipidemia, focal seizures on Vimpat, hypertension, GERD and hypothyroidism, presented to the emergency room with acute onset of increasing frequency of seizures especially during her hemodialysis with subsequent incomplete dialysis sessions and associated altered mental status.  She has been having right gaze deviation during her hemodialysis yesterday.  She was seen initially at [facility] and sent to the ED due to her recurrent seizures during her hemodialysis.  She has urinary incontinence at baseline and left-sided hemiparesis.  She had a neurology consultation with Dr. Wilford Corner they are and he recommended LTM EEG for further characterization of her seizures and adjustment of her antiseizure medications and therefore transferred to [facility].  Bed just became available and the patient was transferred this afternoon.  She was also seen by Dr. Wynelle Link with nephrology and [facility].  ED Course: Latest vital signs as below.  Labs revealed hypokalemia today with a potassium of 3.2 and hypochloremia with a chloride 96 and a BUN of 46 with creatinine 9.41, calcium of 8.3 anion gap of 15 with phosphorus of 5.8 and albumin 3.5.  CBC showed hemoglobin of 11.1 and hematocrit 34.8 comparable to yesterday with macrocytosis and a WBC of 7.9 with platelets of 207.  Yesterday hep B surface antigen came back negative. EKG as reviewed by me : EKG today showed sinus rhythm with a rate of 88  with borderline left axis deviation and low voltage QRS. Imaging: Noncontrasted head CT scan yesterday showed old infarctions in the right middle cerebral artery territory, right occipital lobe and cerebellum with distant right frontal craniotomy and no acute intracranial abnormalities.  Portable chest x-ray yesterday showed findings consistent with mild CHF.  She is directly admitted to a medical telemetry bed from [facility] ED for further evaluation and management. PAST MEDICAL HISTORY:   Past Medical History:  Diagnosis Date   Cancer (HCC) 2017   Right breast   Chronic kidney disease    progression to ESRD 04/10/21   GERD (gastroesophageal reflux disease)    Glaucoma    Hemiparesis (HCC)    left side   High cholesterol    History of seizure    after a spider bite; 07/15/21   History of stroke with residual deficit    left-side weakness   Hypertension    states BP under control with meds., has been on med. x 2 yr.   Hypothyroidism    Non-insulin dependent type 2 diabetes mellitus (HCC)    Overactive bladder    PFO (patent foramen ovale) 05/15/2021   Stroke (HCC)    1998 weakness on left side; 05/12/21    PAST SURGICAL HISTORY:   Past Surgical History:  Procedure Laterality Date   ABDOMINAL HYSTERECTOMY     complete   AV FISTULA PLACEMENT Right 09/29/2021   Procedure: INSERTION OF RIGHT ARM ARTERIOVENOUS (AV) GORE-TEX GRAFT;  Surgeon: Chuck Hint, MD;  Location: [facility] OR;  Service: Vascular;  Laterality: Right;   BREAST BIOPSY Left 08/03/2018  Benign adipose tissue   BREAST BIOPSY Right 02/15/2023   [place] RT BREAST BX W LOC DEV 1ST LESION IMG BX SPEC US GUIDE 02/15/2023 [facility]   BREAST BIOPSY Left 02/15/2023   [place] LT BREAST BX W LOC DEV 1ST LESION IMG BX SPEC US GUIDE 02/15/2023 [facility]   BREAST EXCISIONAL BIOPSY Right 2014   Positive   BREAST LUMPECTOMY Right    BUBBLE STUDY  05/15/2021   Procedure: BUBBLE STUDY;  Surgeon: Jake Bathe, MD;  Location:  [facility] ENDOSCOPY;  Service: Cardiovascular;;   CATARACT EXTRACTION W/ INTRAOCULAR LENS IMPLANT Left    CEREBRAL ANEURYSM REPAIR  1998   DIALYSIS/PERMA CATHETER INSERTION N/A 04/10/2021   Procedure: DIALYSIS/PERMA CATHETER INSERTION;  Surgeon: Annice Needy, MD;  Location: [facility] INVASIVE CV LAB;  Service: Cardiovascular;  Laterality: N/A;   IR FLUORO GUIDE CV LINE RIGHT  04/30/2021   PICC LINE INSERTION     TEE WITHOUT CARDIOVERSION N/A 05/15/2021   Procedure: TRANSESOPHAGEAL ECHOCARDIOGRAM (TEE);  Surgeon: Jake Bathe, MD;  Location: [facility] ENDOSCOPY;  Service: Cardiovascular;  Laterality: N/A;   THYROID LOBECTOMY Right 07/15/2017   Procedure: RIGHT THYROID LOBECTOMY;  Surgeon: Darnell Level, MD;  Location: [facility] OR;  Service: General;  Laterality: Right;    SOCIAL HISTORY:   Social History   Tobacco Use   Smoking status: Never   Smokeless tobacco: Never  Substance Use Topics   Alcohol use: No    FAMILY HISTORY:   Family History  Problem Relation Age of Onset   Cancer Brother        possible prostate cancer per her daughter   Breast cancer Neg Hx     DRUG ALLERGIES:   Allergies  Allergen Reactions   Contrast Media [Iodinated Contrast Media] Swelling   Latex Itching   Shellfish-Derived Products Other (See Comments)    Other reaction(s): NO ALLERGY   Levemir [Insulin Detemir] Itching    REVIEW OF SYSTEMS:   ROS As per history of present illness. All pertinent systems were reviewed above. Constitutional, HEENT, cardiovascular, respiratory, GI, GU, musculoskeletal, neuro, psychiatric, endocrine, integumentary and hematologic systems were reviewed and are otherwise negative/unremarkable except for positive findings mentioned above in the HPI.   MEDICATIONS AT HOME:   Prior to Admission medications   Medication Sig Start Date End Date Taking? Authorizing Provider  acetaminophen (TYLENOL) 500 MG tablet Take 1,000 mg by mouth daily.    [provider]  alum & mag  hydroxide-simeth (MAALOX PLUS) 400-400-40 MG/5ML suspension Take 10 mLs by mouth 4 (four) times daily as needed for indigestion.    [provider]  amoxicillin-clavulanate (AUGMENTIN) 875-125 MG tablet Take 1 tablet by mouth every 12 (twelve) hours. Patient not taking: Reported on 05/31/2023 12/27/22   Tanda Rockers A, DO  anastrozole (ARIMIDEX) 1 MG tablet Take 1 tablet (1 mg total) by mouth daily. 03/17/23   Serena Croissant, MD  aspirin 81 MG chewable tablet Chew 1 tablet (81 mg total) by mouth daily. 08/01/21   Rolly Salter, MD  azithromycin (ZITHROMAX) 250 MG tablet Take 1 tablet (250 mg total) by mouth daily. Take first 2 tablets together, then 1 every day until finished. Patient not taking: Reported on 05/31/2023 12/27/22   Tanda Rockers A, DO  B Complex-C-Zn-Folic Acid (DIALYVITE 800 WITH ZINC) 0.8 MG TABS Take 1 tablet by mouth daily. Patient not taking: Reported on 05/31/2023 01/13/22   [provider]  calcitRIOL (ROCALTROL) 0.5 MCG capsule Take 1 capsule (0.5 mcg total)  by mouth every Monday, Wednesday, and Friday with hemodialysis. 07/31/21   Rolly Salter, MD  dextrose (GLUTOSE) 40 % GEL Place 28 mLs inside cheek as needed (for suspected low sugars). 07/31/21   Rolly Salter, MD  donepezil (ARICEPT) 10 MG tablet Take 10 mg by mouth daily. 06/28/22   [provider]  famotidine (PEPCID) 10 MG tablet Take 10 mg by mouth daily.    [provider]  lacosamide 100 MG TABS Take 1 tablet (100 mg total) by mouth 2 (two) times daily. 07/31/21   Rolly Salter, MD  latanoprost (XALATAN) 0.005 % ophthalmic solution Place 1 drop into both eyes at bedtime. 09/07/18   Tommie Sams, DO  levothyroxine (SYNTHROID, LEVOTHROID) 112 MCG tablet Take 1 tablet (112 mcg total) by mouth daily before breakfast. 09/07/18   Tommie Sams, DO  lidocaine-prilocaine (EMLA) cream Apply 1 application. topically 3 (three) times a week. 02/04/22   [provider]  metoprolol succinate  (TOPROL-XL) 50 MG 24 hr tablet Take 50 mg by mouth daily. 08/20/20   [provider]  multivitamin (RENA-VIT) TABS tablet Take 1 tablet by mouth at bedtime. Patient not taking: Reported on 05/31/2023 07/31/21   Rolly Salter, MD  Nutritional Supplements (,FEEDING SUPPLEMENT, PROSOURCE PLUS) liquid Take 30 mLs by mouth 3 (three) times daily between meals. 07/31/21   Rolly Salter, MD  Nutritional Supplements (FEEDING SUPPLEMENT, BOOST BREEZE,) LIQD Take 1 each by mouth 3 (three) times daily. 07/31/21   Rolly Salter, MD  sucroferric oxyhydroxide (VELPHORO) 500 MG chewable tablet Chew 500 mg by mouth 3 (three) times daily with meals. 10/13/21   [provider]      VITAL SIGNS:  Blood pressure 131/69, pulse 73, temperature 98.1 F (36.7 C), temperature source Oral, resp. rate 18, height 5\' 2"  (1.575 m), weight 58.3 kg, SpO2 99 %.  PHYSICAL EXAMINATION:  Physical Exam  GENERAL:  77 y.o.-year-old patient lying in the bed with no acute distress.  EYES: Pupils equal, round, reactive to light and accommodation. No scleral icterus. Extraocular muscles intact.  HEENT: Head atraumatic, normocephalic. Oropharynx and nasopharynx clear.  NECK:  Supple, no jugular venous distention. No thyroid enlargement, no tenderness.  LUNGS: Normal breath sounds bilaterally, no wheezing, rales,rhonchi or crepitation. No use of accessory muscles of respiration.  CARDIOVASCULAR: Regular rate and rhythm, S1, S2 normal. No murmurs, rubs, or gallops.  ABDOMEN: Soft, nondistended, nontender. Bowel sounds present. No organomegaly or mass.  EXTREMITIES: No pedal edema, cyanosis, or clubbing.  NEUROLOGIC: Cranial nerves II through XII are intact. Muscle strength 5/5 in all extremities. Sensation intact. Gait not checked.  PSYCHIATRIC: The patient is alert and oriented x 3.  Normal affect and good eye contact. SKIN: No obvious rash, lesion, or ulcer.   LABORATORY PANEL:   CBC Recent Labs  Lab  05/31/23 0758  WBC 7.9  HGB 11.1*  HCT 34.8*  PLT 207   ------------------------------------------------------------------------------------------------------------------  Chemistries  Recent Labs  Lab 05/25/23 1732 05/30/23 1524 05/31/23 0758 05/31/23 2028  NA 137   < > 139  --   K 3.2*   < > 3.2*  --   CL 91*   < > 96*  --   CO2 29   < > 28  --   GLUCOSE 126*   < > 98  --   BUN 11   < > 46*  --   CREATININE 4.26*   < > 9.41*  --   CALCIUM  9.2   < > 8.3*  --   MG  --   --   --  2.4  AST 25  --   --   --   ALT 18  --   --   --   ALKPHOS 73  --   --   --   BILITOT 0.4  --   --   --    < > = values in this interval not displayed.   ------------------------------------------------------------------------------------------------------------------  Cardiac Enzymes No results for input(s): "TROPONINI" in the last 168 hours. ------------------------------------------------------------------------------------------------------------------  RADIOLOGY:  CT HEAD WO CONTRAST ( )  Result Date: 05/30/2023 CLINICAL DATA:  New onset seizure.  Dialysis patient. EXAM: CT HEAD WITHOUT CONTRAST TECHNIQUE: Contiguous axial images were obtained from the base of the skull through the vertex without intravenous contrast. RADIATION DOSE REDUCTION: This exam was performed according to the departmental dose-optimization program which includes automated exposure control, adjustment of the mA and/or kV according to patient size and/or use of iterative reconstruction technique. COMPARISON:  l 05/25/2023 FINDINGS: Brain: Distant right frontal craniectomy. Old small vessel infarctions of the cerebellum. Old infarction in the right middle cerebral artery territory affecting the insular region and frontal operculum. Old infarction in the right occipital lobe. No CT evidence of acute infarction, mass, hemorrhage, hydrocephalus or extra-axial collection. Vascular: There is atherosclerotic calcification of the  major vessels at the base of the brain. Skull: Otherwise negative. Sinuses/Orbits: Clear/normal Other: None IMPRESSION: No acute finding by CT. Old infarctions in the right middle cerebral artery territory, right occipital lobe and cerebellum. Distant right frontal craniectomy. Electronically Signed   By: Paulina Fusi M.D.   On: 05/30/2023 16:30   DG Chest Portable 1 View  Result Date: 05/30/2023 CLINICAL DATA:  Weakness, edema EXAM: PORTABLE CHEST 1 VIEW COMPARISON:  12/27/2022 FINDINGS: Single frontal view of the chest demonstrates stable enlargement of the cardiac silhouette and ectasia of the thoracic aorta. There is increased central vascular congestion with mild bilateral infrahilar airspace disease. Trace left effusion. No pneumothorax. IMPRESSION: 1. Findings consistent with mild congestive heart failure. Electronically Signed   By: Sharlet Salina M.D.   On: 05/30/2023 15:59      IMPRESSION AND PLAN:  Assessment and Plan: * Recurrent seizures (HCC) - The patient is admitted to a medical telemetry bed. - She will be placed on seizure precautions. - She will be placed on as needed IV Ativan. - Neurology consult will be obtained. - Dr. Otelia Limes is aware about the patient. - She will have LTM EEG. - For now her Vimpat discontinued.  End-stage renal disease on hemodialysis [facility] - Nephrology consult can be called in a.m. to follow-up on HD on MWF. - We will continue Velphoro and calcitriol. - Given some volume overload as her last session was only 1.5 hours, will closely monitor her to see if she will need HD before tomorrow.  Hypothyroidism - We will continue Synthroid.  Essential hypertension - We will continue Toprol-XL.  History of CVA (cerebrovascular accident) - We will continue aspirin.  History of breast cancer - We will continue Arimidex.   DVT prophylaxis: SQ heparin. Advanced Care Planning:  Code Status: full code. Family Communication:  The plan of care was  discussed in details with the patient (and family). I answered all questions. The patient agreed to proceed with the above mentioned plan. Further management will depend upon hospital course. Disposition Plan: Back to previous home environment Consults called: Neurology.  Nephrology consult can be called  in AM. All the records are reviewed and case discussed with ED provider.  Status is: Inpatient   At the time of the admission, it appears that the appropriate admission status for this patient is inpatient.  This is judged to be reasonable and necessary in order to provide the required intensity of service to ensure the patient's safety given the presenting symptoms, physical exam findings and initial radiographic and laboratory data in the context of comorbid conditions.  The patient requires inpatient status due to high intensity of service, high risk of further deterioration and high frequency of surveillance required.  I certify that at the time of admission, it is my clinical judgment that the patient will require inpatient hospital care extending more than 2 midnights.                            Dispo: The patient is from: Home              Anticipated d/c is to: Home              Patient currently is not medically stable to d/c.              Difficult to place patient: No  Hannah Beat M.D on 05/31/2023 at 9:08 PM  [facility]   From 7 PM-7 AM, contact night-coverage [email or web address]  CC: Primary care physician; [facility], [employer]

## 2023-05-31 NOTE — Progress Notes (Signed)
[facility]  ROUNDING NOTE   Subjective:   [name] was admitted to [facility] on 05/30/2023 for sz,ems  Patient seen laying in bed No family present at bedside Alert, able to answer simple questions  Patient seen later in morning, more somnolent Delayed responses   Objective:  Vital signs in last 24 hours:  Temp:  [97.9 F (36.6 C)-98.1 F (36.7 C)] 98.1 F (36.7 C) (06/25 0900) Pulse Rate:  [72-85] 72 (06/25 0900) Resp:  [11-25] 17 (06/25 1030) BP: (100-139)/(56-72) 138/72 (06/25 1030) SpO2:  [98 %-100 %] 100 % (06/25 0900)  Weight change:  Filed Weights   05/30/23 1441  Weight: 63.5 kg    Intake/Output: No intake/output data recorded.   Intake/Output this shift:  No intake/output data recorded.  Physical Exam: General: NAD, laying on stretcher  Head: Normocephalic, atraumatic. Moist oral mucosal membranes  Eyes: Anicteric  Neck: Supple, trachea midline  Lungs:  Clear to auscultation, room air  Heart: Regular rate and rhythm  Abdomen:  Soft, nontender,   Extremities:  trace peripheral edema.  Neurologic: Tired, alert to self only  Skin: No lesions  Access: Left AVF    Basic Metabolic Panel: Recent Labs  Lab 05/25/23 1713 05/25/23 1732 05/30/23 1524 05/31/23 0758  NA 133* 137 144 139  K 3.9 3.2* 3.5 3.2*  CL 99 91* 97* 96*  CO2  --  29 29 28   GLUCOSE 127* 126* 146* 98  BUN 15 11 38* 46*  CREATININE 4.00* 4.26* 8.03* 9.41*  CALCIUM  --  9.2 9.1 8.3*  PHOS  --   --   --  5.8*     Liver Function Tests: Recent Labs  Lab 05/25/23 1732 05/31/23 0758  AST 25  --   ALT 18  --   ALKPHOS 73  --   BILITOT 0.4  --   PROT 8.0  --   ALBUMIN 3.6 3.5    No results for input(s): "LIPASE", "AMYLASE" in the last 168 hours. No results for input(s): "AMMONIA" in the last 168 hours.  CBC: Recent Labs  Lab 05/25/23 1713 05/25/23 1732 05/30/23 1524 05/31/23 0758  WBC  --  8.3 11.1* 7.9  NEUTROABS  --  5.8 9.0*  --   HGB 13.3  11.8* 11.5* 11.1*  HCT 39.0 37.3 36.3 34.8*  MCV  --  102.2* 103.1* 101.5*  PLT  --  225 217 207     Cardiac Enzymes: No results for input(s): "CKTOTAL", "CKMB", "CKMBINDEX", "TROPONINI" in the last 168 hours.  BNP: Invalid input(s): "POCBNP"  CBG: Recent Labs  Lab 05/25/23 1709  GLUCAP 117*     Microbiology: Results for orders placed or performed during the hospital encounter of 12/27/22  Resp panel by RT-PCR (RSV, Flu A&B, Covid) Anterior Nasal Swab     Status: Abnormal   Collection Time: 12/27/22  3:50 PM   Specimen: Anterior Nasal Swab  Result Value Ref Range Status   SARS Coronavirus 2 by RT PCR POSITIVE (A) NEGATIVE Final    Comment: (NOTE) SARS-CoV-2 target nucleic acids are DETECTED.  The SARS-CoV-2 RNA is generally detectable in upper respiratory specimens during the acute phase of infection. Positive results are indicative of the presence of the identified virus, but do not rule out bacterial infection or co-infection with other pathogens not detected by the test. Clinical correlation with patient history and other diagnostic information is necessary to determine patient infection status. The expected result is Negative.  Fact Sheet for Patients:  BloggerCourse.com  Fact Sheet for Healthcare Providers: SeriousBroker.it  This test is not yet approved or cleared by the [place] FDA and  has been authorized for detection and/or diagnosis of SARS-CoV-2 by FDA under an Emergency Use Authorization (EUA).  This EUA will remain in effect (meaning this test can be used) for the duration of  the COVID-19 declaration under Section 564(b)(1) of the A ct, 21 U.S.C. section 360bbb-3(b)(1), unless the authorization is terminated or revoked sooner.     Influenza A by PCR NEGATIVE NEGATIVE Final   Influenza B by PCR NEGATIVE NEGATIVE Final    Comment: (NOTE) The Xpert Xpress SARS-CoV-2/FLU/RSV plus assay is  intended as an aid in the diagnosis of influenza from Nasopharyngeal swab specimens and should not be used as a sole basis for treatment. Nasal washings and aspirates are unacceptable for Xpert Xpress SARS-CoV-2/FLU/RSV testing.  Fact Sheet for Patients: BloggerCourse.com  Fact Sheet for Healthcare Providers: SeriousBroker.it  This test is not yet approved or cleared by the [place] FDA and has been authorized for detection and/or diagnosis of SARS-CoV-2 by FDA under an Emergency Use Authorization (EUA). This EUA will remain in effect (meaning this test can be used) for the duration of the COVID-19 declaration under Section 564(b)(1) of the Act, 21 U.S.C. section 360bbb-3(b)(1), unless the authorization is terminated or revoked.     Resp Syncytial Virus by PCR NEGATIVE NEGATIVE Final    Comment: (NOTE) Fact Sheet for Patients: BloggerCourse.com  Fact Sheet for Healthcare Providers: SeriousBroker.it  This test is not yet approved or cleared by the [place] FDA and has been authorized for detection and/or diagnosis of SARS-CoV-2 by FDA under an Emergency Use Authorization (EUA). This EUA will remain in effect (meaning this test can be used) for the duration of the COVID-19 declaration under Section 564(b)(1) of the Act, 21 U.S.C. section 360bbb-3(b)(1), unless the authorization is terminated or revoked.  Performed at [facility], [address] [place], [place] [address]     Coagulation Studies: No results for input(s): "LABPROT", "INR" in the last 72 hours.  Urinalysis: No results for input(s): "COLORURINE", "LABSPEC", "PHURINE", "GLUCOSEU", "HGBUR", "BILIRUBINUR", "KETONESUR", "PROTEINUR", "UROBILINOGEN", "NITRITE", "LEUKOCYTESUR" in the last 72 hours.  Invalid input(s): "APPERANCEUR"    Imaging: CT HEAD WO CONTRAST ( )  Result Date:  05/30/2023 CLINICAL DATA:  New onset seizure.  Dialysis patient. EXAM: CT HEAD WITHOUT CONTRAST TECHNIQUE: Contiguous axial images were obtained from the base of the skull through the vertex without intravenous contrast. RADIATION DOSE REDUCTION: This exam was performed according to the departmental dose-optimization program which includes automated exposure control, adjustment of the mA and/or kV according to patient size and/or use of iterative reconstruction technique. COMPARISON:  l 05/25/2023 FINDINGS: Brain: Distant right frontal craniectomy. Old small vessel infarctions of the cerebellum. Old infarction in the right middle cerebral artery territory affecting the insular region and frontal operculum. Old infarction in the right occipital lobe. No CT evidence of acute infarction, mass, hemorrhage, hydrocephalus or extra-axial collection. Vascular: There is atherosclerotic calcification of the major vessels at the base of the brain. Skull: Otherwise negative. Sinuses/Orbits: Clear/normal Other: None IMPRESSION: No acute finding by CT. Old infarctions in the right middle cerebral artery territory, right occipital lobe and cerebellum. Distant right frontal craniectomy. Electronically Signed   By: Paulina Fusi M.D.   On: 05/30/2023 16:30   DG Chest Portable 1 View  Result Date: 05/30/2023 CLINICAL DATA:  Weakness, edema EXAM: PORTABLE CHEST 1 VIEW COMPARISON:  12/27/2022  FINDINGS: Single frontal view of the chest demonstrates stable enlargement of the cardiac silhouette and ectasia of the thoracic aorta. There is increased central vascular congestion with mild bilateral infrahilar airspace disease. Trace left effusion. No pneumothorax. IMPRESSION: 1. Findings consistent with mild congestive heart failure. Electronically Signed   By: Sharlet Salina M.D.   On: 05/30/2023 15:59     Medications:     anastrozole  1 mg Oral Daily   lacosamide  100 mg Oral BID   latanoprost  1 drop Both Eyes QHS      Assessment/ Plan:  [name] is a 77 y.o.  female with end stage renal disease on hemodialysis, seizure disorder, glaucoma, CVA with left sided hemiperesis, hyperlipidemia, diabetes mellitus type II, hypertension, who is admitted to [facility] on 05/30/2023 for seizure.   [facility] MWF [address] Left AVF 59.3kg   End Stage Renal Disease: on hemodialysis. No acute indication for dialysis. Next treatment scheduled for Wednesday  Seizure disorder: home regimen of lacosamide which is 50% dialyzed. She may need post HD treatment dosage. Concern for silent seizures given change of mentation today  Hypertension with chronic kidney disease: home regimen of metoprolol  Secondary Hyperparathyroidism: Continue velphoro for phosphate binding.   Anemia of chronic kidney disease: Macrocytic. Mircera as outpatient. Hold ESA due to seizure and possible ischemic event.    LOS: 0   6/25/20243:10 PM

## 2023-05-31 NOTE — Assessment & Plan Note (Signed)
-   We will continue Synthroid. 

## 2023-05-31 NOTE — ED Notes (Signed)
Spoke with patient son Ysidra, S[place] about patient transfer to [facility].

## 2023-05-31 NOTE — ED Notes (Deleted)
called to carelink per MD Quale/pt accepted to [facility] to Ed/still no bed assignment/rep:tammy.

## 2023-05-31 NOTE — ED Provider Notes (Signed)
-----------------------------------------   5:36 PM on 05/31/2023 ----------------------------------------- Patient care assumed from Dr. Sidney Ace.  Neurology has seen and evaluated the patient and believe that the patient needs to be transferred back to [facility] for continuous EEG and possibly EEG during dialysis to see if the patient's episodes she is experiencing during dialysis are seizure related.  Neurology has spoken to [facility] to help arrange this transfer.  Patient will be transferred once a bed is assigned.   Minna Antis, MD 05/31/23 1737

## 2023-05-31 NOTE — Assessment & Plan Note (Addendum)
-   Nephrology consult can be called in a.m. to follow-up on HD on MWF. - We will continue Velphoro and calcitriol. - Given some volume overload as her last session was only 1.5 hours, will closely monitor her to see if she will need HD before tomorrow.

## 2023-05-31 NOTE — ED Provider Notes (Addendum)
Vitals:   05/31/23 0600 05/31/23 0630  BP: 132/67 130/67  Pulse:    Resp: 18 16  Temp:    SpO2:       Discussed with bed placement, they are aware of the patient, patient has been accepted to [facility] but is currently still pending an available ready bed.  I have sent a notification to Dr. Ronn Melena and Dr. Wilford Corner that no bed is yet available at [facility], request each team continue to follow patient / round while we await bed availablitity at [facility].    The patient is resting, she awakens easily to voice.  She is very pleasant in no distress.  She voices no needs at this time but does understand that we are currently still awaiting a bed to become available at [facility].   Sharyn Creamer, MD 05/31/23 Sharman Crate    Sharyn Creamer, MD 05/31/23 [phone]

## 2023-05-31 NOTE — Consult Note (Incomplete)
NEURO HOSPITALIST CONSULT NOTE   Requestig physician: Dr. Adela Glimpse  Reason for Consult:  Increasing frequency of seizures causing hindrance for dialysis in a patient with known stroke and seizures   History obtained from:  Patient   Chart  Patient and Chart   ***  HPI:                                                                                                                                          Kirsten Mcmahon is an 77 y.o. female with a PMHx of right breast cancer, ESRD on HD, right MCA stroke with left hemiparesis, glaucoma, hypercholesterolemia, focal seizures on Vimpat (first episode after a spider bite in 07/15/21), HTN, hypothyroidism, DM2 and PFO who initially presented to [facility] on 6/24 after she had a tonic-clonic seizure during dialysis that lasted about one minute. She had been seen 5 days earlier by Neurology after a spell of ***   Past Medical History:  Diagnosis Date   Cancer (HCC) 2017   Right breast   Chronic kidney disease    progression to ESRD 04/10/21   GERD (gastroesophageal reflux disease)    Glaucoma    Hemiparesis (HCC)    left side   High cholesterol    History of seizure    after a spider bite; 07/15/21   History of stroke with residual deficit    left-side weakness   Hypertension    states BP under control with meds., has been on med. x 2 yr.   Hypothyroidism    Non-insulin dependent type 2 diabetes mellitus (HCC)    Overactive bladder    PFO (patent foramen ovale) 05/15/2021   Stroke (HCC)    1998 weakness on left side; 05/12/21    Past Surgical History:  Procedure Laterality Date   ABDOMINAL HYSTERECTOMY     complete   AV FISTULA PLACEMENT Right 09/29/2021   Procedure: INSERTION OF RIGHT ARM ARTERIOVENOUS (AV) GORE-TEX GRAFT;  Surgeon: Chuck Hint, MD;  Location: [facility] OR;  Service: Vascular;  Laterality: Right;   BREAST BIOPSY Left 08/03/2018   Benign adipose tissue   BREAST BIOPSY Right 02/15/2023   [place] RT BREAST  BX W LOC DEV 1ST LESION IMG BX SPEC US GUIDE 02/15/2023 [facility] MAMMOGRAPHY   BREAST BIOPSY Left 02/15/2023   [place] LT BREAST BX W LOC DEV 1ST LESION IMG BX SPEC US GUIDE 02/15/2023 [facility] MAMMOGRAPHY   BREAST EXCISIONAL BIOPSY Right 2014   Positive   BREAST LUMPECTOMY Right    BUBBLE STUDY  05/15/2021   Procedure: BUBBLE STUDY;  Surgeon: Jake Bathe, MD;  Location: [facility] ENDOSCOPY;  Service: Cardiovascular;;   CATARACT EXTRACTION W/ INTRAOCULAR LENS IMPLANT Left    CEREBRAL ANEURYSM REPAIR  1998   DIALYSIS/PERMA CATHETER INSERTION N/A  04/10/2021   Procedure: DIALYSIS/PERMA CATHETER INSERTION;  Surgeon: Annice Needy, MD;  Location: [facility] INVASIVE CV LAB;  Service: Cardiovascular;  Laterality: N/A;   IR FLUORO GUIDE CV LINE RIGHT  04/30/2021   PICC LINE INSERTION     TEE WITHOUT CARDIOVERSION N/A 05/15/2021   Procedure: TRANSESOPHAGEAL ECHOCARDIOGRAM (TEE);  Surgeon: Jake Bathe, MD;  Location: [facility] ENDOSCOPY;  Service: Cardiovascular;  Laterality: N/A;   THYROID LOBECTOMY Right 07/15/2017   Procedure: RIGHT THYROID LOBECTOMY;  Surgeon: Darnell Level, MD;  Location: [facility] OR;  Service: General;  Laterality: Right;    Family History  Problem Relation Age of Onset   Cancer Brother        possible prostate cancer per her daughter   Breast cancer Neg Hx             ***  Family History: *** Mother *** Father ***  Social History:  reports that she has never smoked. She has never used smokeless tobacco. She reports that she does not drink alcohol and does not use drugs.  Allergies  Allergen Reactions   Contrast Media [Iodinated Contrast Media] Swelling   Latex Itching   Shellfish-Derived Products Other (See Comments)    Other reaction(s): NO ALLERGY   Levemir [Insulin Detemir] Itching    MEDICATIONS:                                                                                                                     {medication reviewed/display:3041432}   ROS:                                                                                                                                        History obtained from {source of history:310783}  General ROS: negative for - chills, fatigue, fever, night sweats, weight gain or weight loss Psychological ROS: negative for - behavioral disorder, hallucinations, memory difficulties, mood swings or suicidal ideation Ophthalmic ROS: negative for - blurry vision, double vision, eye pain or loss of vision ENT ROS: negative for - epistaxis, nasal discharge, oral lesions, sore throat, tinnitus or vertigo Allergy and Immunology ROS: negative for - hives or itchy/watery eyes Hematological and Lymphatic ROS: negative for - bleeding problems, bruising or swollen lymph nodes Endocrine ROS: negative for - galactorrhea, hair pattern changes, polydipsia/polyuria or temperature intolerance Respiratory ROS: negative for - cough, hemoptysis, shortness of breath or wheezing Cardiovascular ROS: negative for -  chest pain, dyspnea on exertion, edema or irregular heartbeat Gastrointestinal ROS: negative for - abdominal pain, diarrhea, hematemesis, nausea/vomiting or stool incontinence Genito-Urinary ROS: negative for - dysuria, hematuria, incontinence or urinary frequency/urgency Musculoskeletal ROS: negative for - joint swelling or muscular weakness Neurological ROS: as noted in HPI Dermatological ROS: negative for rash and skin lesion changes   There were no vitals taken for this visit.   General Examination:                                                                                                       Physical Exam  HEENT-  Normocephalic, no lesions, without obvious abnormality.  Normal external eye and conjunctiva.   Cardiovascular- S1-S2 audible, pulses palpable throughout   Lungs-no rhonchi or wheezing noted, no excessive working breathing.  Saturations within normal limits Abdomen- All 4 quadrants palpated and nontender Extremities- Warm, dry and  intact Musculoskeletal-no joint tenderness, deformity or swelling Skin-warm and dry, no hyperpigmentation, vitiligo, or suspicious lesions  Neurological Examination Mental Status: Alert, oriented, thought content appropriate.  Speech fluent without evidence of aphasia.  Able to follow 3 step commands without difficulty. Cranial Nerves: II: Discs flat bilaterally; Visual fields grossly normal,  III,IV, VI: ptosis not present, extra-ocular motions intact bilaterally pupils equal, round, reactive to light and accommodation V,VII: smile symmetric, facial light touch sensation normal bilaterally VIII: hearing normal bilaterally IX,X: uvula rises symmetrically XI: bilateral shoulder shrug XII: midline tongue extension Motor: Right : Upper extremity   5/5    Left:     Upper extremity   5/5  Lower extremity   5/5     Lower extremity   5/5 Tone and bulk:normal tone throughout; no atrophy noted Sensory: Pinprick and light touch intact throughout, bilaterally Deep Tendon Reflexes: 2+ and symmetric throughout Plantars: Right: downgoing   Left: downgoing Cerebellar: normal finger-to-nose, normal rapid alternating movements and normal heel-to-shin test Gait: normal gait and station   Lab Results: Basic Metabolic Panel: Recent Labs  Lab 05/25/23 1713 05/25/23 1732 05/30/23 1524 05/31/23 0758  NA 133* 137 144 139  K 3.9 3.2* 3.5 3.2*  CL 99 91* 97* 96*  CO2  --  29 29 28   GLUCOSE 127* 126* 146* 98  BUN 15 11 38* 46*  CREATININE 4.00* 4.26* 8.03* 9.41*  CALCIUM  --  9.2 9.1 8.3*  PHOS  --   --   --  5.8*    CBC: Recent Labs  Lab 05/25/23 1713 05/25/23 1732 05/30/23 1524 05/31/23 0758  WBC  --  8.3 11.1* 7.9  NEUTROABS  --  5.8 9.0*  --   HGB 13.3 11.8* 11.5* 11.1*  HCT 39.0 37.3 36.3 34.8*  MCV  --  102.2* 103.1* 101.5*  PLT  --  225 217 207    Cardiac Enzymes: No results for input(s): "CKTOTAL", "CKMB", "CKMBINDEX", "TROPONINI" in the last 168 hours.  Lipid Panel: No  results for input(s): "CHOL", "TRIG", "HDL", "CHOLHDL", "VLDL", "LDLCALC" in the last 168 hours.  Imaging: CT HEAD WO CONTRAST ( )  Result Date: 05/30/2023 CLINICAL DATA:  New onset seizure.  Dialysis patient. EXAM: CT HEAD WITHOUT CONTRAST TECHNIQUE: Contiguous axial images were obtained from the base of the skull through the vertex without intravenous contrast. RADIATION DOSE REDUCTION: This exam was performed according to the departmental dose-optimization program which includes automated exposure control, adjustment of the mA and/or kV according to patient size and/or use of iterative reconstruction technique. COMPARISON:  l 05/25/2023 FINDINGS: Brain: Distant right frontal craniectomy. Old small vessel infarctions of the cerebellum. Old infarction in the right middle cerebral artery territory affecting the insular region and frontal operculum. Old infarction in the right occipital lobe. No CT evidence of acute infarction, mass, hemorrhage, hydrocephalus or extra-axial collection. Vascular: There is atherosclerotic calcification of the major vessels at the base of the brain. Skull: Otherwise negative. Sinuses/Orbits: Clear/normal Other: None IMPRESSION: No acute finding by CT. Old infarctions in the right middle cerebral artery territory, right occipital lobe and cerebellum. Distant right frontal craniectomy. Electronically Signed   By: Paulina Fusi M.D.   On: 05/30/2023 16:30   DG Chest Portable 1 View  Result Date: 05/30/2023 CLINICAL DATA:  Weakness, edema EXAM: PORTABLE CHEST 1 VIEW COMPARISON:  12/27/2022 FINDINGS: Single frontal view of the chest demonstrates stable enlargement of the cardiac silhouette and ectasia of the thoracic aorta. There is increased central vascular congestion with mild bilateral infrahilar airspace disease. Trace left effusion. No pneumothorax. IMPRESSION: 1. Findings consistent with mild congestive heart failure. Electronically Signed   By: Sharlet Salina M.D.   On:  05/30/2023 15:59     Assessment: -  Recommendations: - LTM EEG (ordered)    Electronically signed: Dr. Caryl Pina 05/31/2023, 7:07 PM

## 2023-05-31 NOTE — ED Notes (Signed)
Hemodialysis was called regarding treatment today, they reported she will have her treatment on 2nd shift; they requested that lab come and draw 0500 labs in ED. Phlebotomy made aware.

## 2023-05-31 NOTE — Assessment & Plan Note (Addendum)
-   The patient is admitted to a medical telemetry bed. - She will be placed on seizure precautions. - She will be placed on as needed IV Ativan. - Neurology consult will be obtained. - Dr. Otelia Limes is aware about the patient. - She will have LTM EEG. - For now her Vimpat is continued.

## 2023-05-31 NOTE — Assessment & Plan Note (Signed)
-   We will continue Arimidex. 

## 2023-05-31 NOTE — ED Notes (Signed)
called to carelink per MD Quale/pt accepted to [facility]/still no bed assignment/rep:tammy.

## 2023-05-31 NOTE — Consult Note (Signed)
Neurology Consultation  Reason for Consult: Increasing frequency of seizures Referring Physician: Dr. Roxan Hockey  CC: Increasing frequency of seizures causing hindrance for dialysis in a patient with known stroke and seizures  History is obtained from: Chart, patient  HPI: Kirsten Mcmahon is a 77 y.o. female past medical history of recurrent breast cancer, ESRD on dialysis, left hemiplegia from a prior right MCA stroke, hyperlipidemia, focal seizures on Vimpat, hypertension being asked to see by neurology for increasing frequency of seizures especially during dialysis which is leading to incomplete dialysis treatments and altered mental status. According to the ER provider, the patient has been having multiple seizures with right gaze deviation while in the dialysis unit which has led to incomplete dialysis sessions and is hindering her medical care.  Case was discussed with me over the phone yesterday.  I recommended transfer to [facility] for LTM EEG to characterize the spells because the description of the spells was not very clear and was described as generalized seizure only. Given her during renal function and somewhat altered mental status, I would be hesitant to increase any medication without further workup with a long-term EEG.  Bed request for [facility] has been placed but bed is not available yet.  Neurological evaluation was requested at [facility] in the interim Patient is not reliable historian.  No family at bedside.   ROS: Unable to reliably ascertain given her mentation  Past Medical History:  Diagnosis Date   Cancer (HCC) 2017   Right breast   Chronic kidney disease    progression to ESRD 04/10/21   GERD (gastroesophageal reflux disease)    Glaucoma    Hemiparesis (HCC)    left side   High cholesterol    History of seizure    after a spider bite; 07/15/21   History of stroke with residual deficit    left-side weakness   Hypertension    states BP under  control with meds., has been on med. x 2 yr.   Hypothyroidism    Non-insulin dependent type 2 diabetes mellitus (HCC)    Overactive bladder    PFO (patent foramen ovale) 05/15/2021   Stroke (HCC)    1998 weakness on left side; 05/12/21     Family History  Problem Relation Age of Onset   Cancer Brother        possible prostate cancer per her daughter   Breast cancer Neg Hx      Social History:   reports that she has never smoked. She has never used smokeless tobacco. She reports that she does not drink alcohol and does not use drugs.  Medications  Current Facility-Administered Medications:    anastrozole (ARIMIDEX) tablet 1 mg, 1 mg, Oral, Daily, Willy Eddy, MD, 1 mg at 05/31/23 2841   lacosamide (VIMPAT) tablet 100 mg, 100 mg, Oral, BID, Willy Eddy, MD, 100 mg at 05/31/23 0916   latanoprost (XALATAN) 0.005 % ophthalmic solution 1 drop, 1 drop, Both Eyes, QHS, Willy Eddy, MD  Current Outpatient Medications:    amoxicillin-clavulanate (AUGMENTIN) 875-125 MG tablet, Take 1 tablet by mouth every 12 (twelve) hours., Disp: 14 tablet, Rfl: 0   anastrozole (ARIMIDEX) 1 MG tablet, Take 1 tablet (1 mg total) by mouth daily., Disp: 90 tablet, Rfl: 3   aspirin 81 MG chewable tablet, Chew 1 tablet (81 mg total) by mouth daily., Disp: 60 tablet, Rfl: 0   azithromycin (ZITHROMAX) 250 MG tablet, Take 1 tablet (250 mg total) by mouth daily. Take  first 2 tablets together, then 1 every day until finished., Disp: 6 tablet, Rfl: 0   B Complex-C-Zn-Folic Acid (DIALYVITE 800 WITH ZINC) 0.8 MG TABS, Take 1 tablet by mouth daily., Disp: , Rfl:    calcitRIOL (ROCALTROL) 0.5 MCG capsule, Take 1 capsule (0.5 mcg total) by mouth every Monday, Wednesday, and Friday with hemodialysis., Disp: 30 capsule, Rfl: 0   dextrose (GLUTOSE) 40 % GEL, Place 28 mLs inside cheek as needed (for suspected low sugars)., Disp: 37.5 g, Rfl: 2   lacosamide 100 MG TABS, Take 1 tablet (100 mg total) by mouth 2  (two) times daily., Disp: 60 tablet, Rfl: 0   latanoprost (XALATAN) 0.005 % ophthalmic solution, Place 1 drop into both eyes at bedtime., Disp: 2.5 mL, Rfl: 0   levothyroxine (SYNTHROID, LEVOTHROID) 112 MCG tablet, Take 1 tablet (112 mcg total) by mouth daily before breakfast., Disp: 90 tablet, Rfl: 0   lidocaine-prilocaine (EMLA) cream, Apply 1 application. topically 3 (three) times a week., Disp: , Rfl:    metoprolol succinate (TOPROL-XL) 50 MG 24 hr tablet, Take 50 mg by mouth daily., Disp: , Rfl:    multivitamin (RENA-VIT) TABS tablet, Take 1 tablet by mouth at bedtime., Disp: 30 tablet, Rfl: 0   Nutritional Supplements (,FEEDING SUPPLEMENT, PROSOURCE PLUS) liquid, Take 30 mLs by mouth 3 (three) times daily between meals., Disp: 1000 mL, Rfl: 0   Nutritional Supplements (FEEDING SUPPLEMENT, BOOST BREEZE,) LIQD, Take 1 each by mouth 3 (three) times daily., Disp: 10000 mL, Rfl: 0   sucroferric oxyhydroxide (VELPHORO) 500 MG chewable tablet, Chew 500 mg by mouth 3 (three) times daily with meals., Disp: , Rfl:    Exam: Current vital signs: BP 137/70   Pulse 72   Temp 98.1 F (36.7 C) (Oral)   Resp 20   Ht 5\' 2"  (1.575 m)   Wt 63.5 kg   SpO2 100%   BMI 25.62 kg/m  Vital signs in last 24 hours: Temp:  [97.9 F (36.6 C)-98.1 F (36.7 C)] 98.1 F (36.7 C) (06/25 0900) Pulse Rate:  [72-91] 72 (06/25 0900) Resp:  [11-25] 20 (06/25 0900) BP: (100-139)/(56-75) 137/70 (06/25 0900) SpO2:  [95 %-100 %] 100 % (06/25 0900) Weight:  [63.5 kg] 63.5 kg (06/24 1441) General: The patient is awake alert in no distress HEENT: Normocephalic atraumatic CVS: Regular rate rhythm Abdomen nondistended nontender Extremities warm well-perfused Chest clear to auscultation Neurological exam Awake alert oriented to self. Oriented to the fact that she is in the hospital but could not tell me which 1. Could not tell me her age correctly-that she is 77 years old. Could not tell me the month correctly Mild  dysarthria and positive speech with no gross aphasia. Follows simple commands but unable to follow complex commands. Poor attention concentration Cranial nerve examination: Pupils equal round reactive light, extraocular movements appear unhindered but she does have a mild rightward preference.  Left homonymous hemianopsia, face is symmetric. Motor examination with left hemiplegia, right leg 4/5.  Right upper extremity with no drift 4+/5. Sensory examination: Equal to touch all over Coordination difficult to assess   Labs I have reviewed labs in epic and the results pertinent to this consultation are: CBC    Component Value Date/Time   WBC 7.9 05/31/2023 0758   RBC 3.43 (L) 05/31/2023 0758   HGB 11.1 (L) 05/31/2023 0758   HCT 34.8 (L) 05/31/2023 0758   PLT 207 05/31/2023 0758   MCV 101.5 (H) 05/31/2023 0758   MCH 32.4 05/31/2023  0758   MCHC 31.9 05/31/2023 0758   RDW 13.9 05/31/2023 0758   LYMPHSABS 1.4 05/30/2023 1524   MONOABS 0.5 05/30/2023 1524   EOSABS 0.1 05/30/2023 1524   BASOSABS 0.0 05/30/2023 1524    CMP     Component Value Date/Time   NA 139 05/31/2023 0758   K 3.2 (L) 05/31/2023 0758   CL 96 (L) 05/31/2023 0758   CO2 28 05/31/2023 0758   GLUCOSE 98 05/31/2023 0758   BUN 46 (H) 05/31/2023 0758   CREATININE 9.41 (H) 05/31/2023 0758   CALCIUM 8.3 (L) 05/31/2023 0758   PROT 8.0 05/25/2023 1732   ALBUMIN 3.5 05/31/2023 0758   AST 25 05/25/2023 1732   ALT 18 05/25/2023 1732   ALKPHOS 73 05/25/2023 1732   BILITOT 0.4 05/25/2023 1732   GFRNONAA 4 (L) 05/31/2023 0758   GFRAA 34 (L) 08/01/2018 1056    Imaging I have reviewed the images obtained:  CT-head-agree with radiological interpretation: No acute finding.  Old right MCA territory and right occipital as well as right cerebellar infarction.  Evidence of old right frontal craniectomy.  Assessment:  77 year old woman with recurrent breast cancer, ESRD on dialysis, left hemiplegia from a prior right MCA  stroke, hyperlipidemia, focal seizures on Vimpat and hypertension who has been having increasing frequency of seizures-mostly having seizures on every dialysis session leading her to miss or have incomplete dialysis sessions. At this time, it is unclear whether these are true seizures versus episodes of dialysis disequilibrium. The best way to ascertain this would be to spell characterize them using video EEG-which is available at [facility]. CT head does not reveal any acute abnormality Recurrent breast cancer history warrants further brain imaging with MRI-with contrast would also be indicated. Deranged renal function causing toxic metabolic encephalopathy is also contributing to her current presentation of confusion.   Impression: Increasing for of seizures-evaluate for breakthrough seizure versus dialysis disequilibrium. Recurrent breast cancer-evaluate for mets Toxic metabolic encephalopathy  Recommendations: Continue with current Vimpat 100 BID Seizure precautions MR brain with and without contrast LTM EEG when at [facility] to characterize spells Toxic metabolic derangement management per primary team  D/W Dr Sidney Ace -- Milon Dikes, MD Neurologist [facility] Pager: [phone]

## 2023-05-31 NOTE — ED Notes (Signed)
EMTALA Reviewed by this RN.  

## 2023-06-01 ENCOUNTER — Other Ambulatory Visit: Payer: Self-pay

## 2023-06-01 ENCOUNTER — Encounter (HOSPITAL_COMMUNITY): Payer: Self-pay | Admitting: Family Medicine

## 2023-06-01 ENCOUNTER — Other Ambulatory Visit (HOSPITAL_COMMUNITY): Payer: Self-pay

## 2023-06-01 DIAGNOSIS — G40909 Epilepsy, unspecified, not intractable, without status epilepticus: Secondary | ICD-10-CM | POA: Diagnosis not present

## 2023-06-01 LAB — GLUCOSE, CAPILLARY
Glucose-Capillary: 69 mg/dL — ABNORMAL LOW (ref 70–99)
Glucose-Capillary: 209 mg/dL — ABNORMAL HIGH (ref 70–99)
Glucose-Capillary: 111 mg/dL — ABNORMAL HIGH (ref 70–99)
Glucose-Capillary: 78 mg/dL (ref 70–99)

## 2023-06-01 LAB — CBC
HCT: 34.1 % — ABNORMAL LOW (ref 36.0–46.0)
Hemoglobin: 11.1 g/dL — ABNORMAL LOW (ref 12.0–15.0)
MCH: 33.4 pg (ref 26.0–34.0)
MCHC: 32.6 g/dL (ref 30.0–36.0)
MCV: 102.7 fL — ABNORMAL HIGH (ref 80.0–100.0)
Platelets: 180 10*3/uL (ref 150–400)
RBC: 3.32 MIL/uL — ABNORMAL LOW (ref 3.87–5.11)
RDW: 14 % (ref 11.5–15.5)
WBC: 8.8 10*3/uL (ref 4.0–10.5)
nRBC: 0 % (ref 0.0–0.2)

## 2023-06-01 LAB — HEPATITIS B SURFACE ANTIBODY, QUANTITATIVE: Hep B S AB Quant (Post): 532 m[IU]/mL (ref >9.9)

## 2023-06-01 LAB — BASIC METABOLIC PANEL
Anion gap: 18 — ABNORMAL HIGH (ref 5–15)
BUN: 52 mg/dL — ABNORMAL HIGH (ref 8–23)
CO2: 25 mmol/L (ref 22–32)
Calcium: 8.5 mg/dL — ABNORMAL LOW (ref 8.9–10.3)
Chloride: 96 mmol/L — ABNORMAL LOW (ref 98–111)
Creatinine, Ser: 10.93 mg/dL — ABNORMAL HIGH (ref 0.44–1.00)
GFR, Estimated: 3 mL/min — ABNORMAL LOW (ref >60)
Glucose, Bld: 101 mg/dL — ABNORMAL HIGH (ref 70–99)
Potassium: 3.6 mmol/L (ref 3.5–5.1)
Sodium: 139 mmol/L (ref 135–145)

## 2023-06-01 LAB — MRSA NEXT GEN BY PCR, NASAL: MRSA by PCR Next Gen: NOT DETECTED

## 2023-06-01 MED ORDER — CHLORHEXIDINE GLUCONATE CLOTH 2 % EX PADS
6.0000 | MEDICATED_PAD | Freq: Every day | CUTANEOUS | Status: DC
  Administered 2023-06-02 – 2023-06-05 (×4): 6 via TOPICAL

## 2023-06-01 MED ORDER — POTASSIUM CHLORIDE 2 MEQ/ML IV SOLN
INTRAVENOUS | Status: DC
  Filled 2023-06-01 (×3): qty 1000

## 2023-06-01 MED ORDER — DEXTROSE 50 % IV SOLN
INTRAVENOUS | Status: AC
  Administered 2023-06-01: 50 mL via INTRAVENOUS
  Filled 2023-06-01: qty 50

## 2023-06-01 MED ORDER — HEPARIN SODIUM (PORCINE) 1000 UNIT/ML DIALYSIS
2000.0000 [IU] | Freq: Once | INTRAMUSCULAR | Status: AC
  Administered 2023-06-01: 2000 [IU] via INTRAVENOUS_CENTRAL
  Filled 2023-06-01: qty 2

## 2023-06-01 MED ORDER — SODIUM CHLORIDE 0.9 % IV SOLN
150.0000 mg | Freq: Two times a day (BID) | INTRAVENOUS | Status: DC
  Administered 2023-06-01 – 2023-06-03 (×4): 150 mg via INTRAVENOUS
  Filled 2023-06-01 (×5): qty 15

## 2023-06-01 MED ORDER — POTASSIUM CHLORIDE 2 MEQ/ML IV SOLN: INTRAVENOUS | Status: DC

## 2023-06-01 MED ORDER — SODIUM CHLORIDE 0.9 % IV SOLN
100.0000 mg | Freq: Two times a day (BID) | INTRAVENOUS | Status: DC
  Administered 2023-06-01: 100 mg via INTRAVENOUS
  Filled 2023-06-01 (×2): qty 10

## 2023-06-01 MED ORDER — DEXTROSE 50 % IV SOLN: 1.0000 | Freq: Once | INTRAVENOUS | Status: AC

## 2023-06-01 NOTE — Progress Notes (Signed)
Received patient in bed to unit.  Alert and oriented. x2 Informed consent signed and in chart. yes  TX duration:3:45  Patient tolerated well. yes Transported back to the room yes Alert, without acute distress. Pt caox2,  Hand-off given to patient's nurse. Clinton RN  Access used: Right upper fistula Access issues: none, due to movement occasional high venous pressure  Total UF removed: 0 Medication(s) given: 0 Post HD VS: 136/56 mp81, Hr 83. Post HD weight: 62.5kg   Greer Ee [facility]

## 2023-06-01 NOTE — Plan of Care (Signed)
  Problem: Education: Goal: Knowledge of General Education information will improve Description: Including pain rating scale, medication(s)/side effects and non-pharmacologic comfort measures Outcome: Not Progressing   Problem: Health Behavior/Discharge Planning: Goal: Ability to manage health-related needs will improve Outcome: Not Progressing   Problem: Clinical Measurements: Goal: Ability to maintain clinical measurements within normal limits will improve Outcome: Not Progressing Goal: Will remain free from infection Outcome: Not Progressing Goal: Diagnostic test results will improve Outcome: Not Progressing Goal: Respiratory complications will improve Outcome: Not Progressing Goal: Cardiovascular complication will be avoided Outcome: Not Progressing   Problem: Activity: Goal: Risk for activity intolerance will decrease Outcome: Not Progressing   Problem: Nutrition: Goal: Adequate nutrition will be maintained Outcome: Not Progressing   Problem: Coping: Goal: Level of anxiety will decrease Outcome: Not Progressing   Problem: Elimination: Goal: Will not experience complications related to bowel motility Outcome: Not Progressing Goal: Will not experience complications related to urinary retention Outcome: Not Progressing   Problem: Pain Managment: Goal: General experience of comfort will improve Outcome: Not Progressing   Problem: Safety: Goal: Ability to remain free from injury will improve Outcome: Not Progressing   Problem: Skin Integrity: Goal: Risk for impaired skin integrity will decrease Outcome: Not Progressing   Problem: Education: Goal: Expressions of having a comfortable level of knowledge regarding the disease process will increase Outcome: Not Progressing   Problem: Coping: Goal: Ability to adjust to condition or change in health will improve Outcome: Not Progressing Goal: Ability to identify appropriate support needs will improve Outcome: Not  Progressing   Problem: Health Behavior/Discharge Planning: Goal: Compliance with prescribed medication regimen will improve Outcome: Not Progressing   Problem: Medication: Goal: Risk for medication side effects will decrease Outcome: Not Progressing   Problem: Clinical Measurements: Goal: Complications related to the disease process, condition or treatment will be avoided or minimized Outcome: Not Progressing Goal: Diagnostic test results will improve Outcome: Not Progressing   Problem: Safety: Goal: Verbalization of understanding the information provided will improve Outcome: Not Progressing   Problem: Self-Concept: Goal: Level of anxiety will decrease Outcome: Not Progressing Goal: Ability to verbalize feelings about condition will improve Outcome: Not Progressing   

## 2023-06-01 NOTE — Progress Notes (Addendum)
Upon initial assessment, patient not responding to sternal rub with pin point nonreactive pupils,  VS stable, but CBG 69 Rapid Response RN called, Charge RN Tracey able to rouse patient some more, MD notified and patient beginning to become more alert, D50 administered per MD orders. Patient now alert to self. New orders received.

## 2023-06-01 NOTE — Procedures (Signed)
Patient Name: Kirsten Mcmahon  MRN: [id number]  Epilepsy Attending: [name]  Referring Physician/Provider: Caryl Pina, MD  Duration: 05/31/2023 2038 to 06/02/2023 2038  Patient history: 77 year old female with a history of right MCA stroke and left hemiplegia admitted due to increasing frequency of seizures hindering dialysis sessions. EEG to evaluate for seizure.  Level of alertness: Awake, sleep  AEDs during EEG study: LCM  Technical aspects: This EEG study was done with scalp electrodes positioned according to the 10-20 International system of electrode placement. Electrical activity was reviewed with band pass filter of 1-70Hz , sensitivity of 7 uV/mm, display speed of 80mm/sec with a 60Hz  notched filter applied as appropriate. EEG data were recorded continuously and digitally stored.  Video monitoring was available and reviewed as appropriate.  Description: : The posterior dominant rhythm consists of 8Hz  activity of moderate voltage (25-35 uV) seen predominantly in posterior head regions, symmetric and reactive to eye opening and eye closing. Sleep was characterized by vertex waves, sleep spindles (12-14 Hz, maximal frontocentral region. EEG showed continuous generalized and lateralized right hemisphere 3 to 6 Hz theta-delta slowing. There are also sharply contoured waves in the right centro-parietal region consistent with underlying breach artifact. Spikes were noted in right>left frontotemporal region. Physiologic photic driving was not seen during photic stimulation.  Hyperventilation was not performed.     ABNORMALITY -Spike, right>left frontotemporal region. -Breach artifact, right centro-parietal region -Continuous slow, generalized and lateralized right hemisphere   IMPRESSION: This study is consistent with patient's history of epilepsy arising from right>left frontotemporal region. Additionally there is evidence of cortical dysfunction in right centro-parietal region  consistent with prior craniotomy and underlying stroke. Lastly there is also moderate degree of encephalopathy.  No seizures were seen throughout the recording.   Ashle Stief Annabelle Harman

## 2023-06-01 NOTE — Progress Notes (Signed)
[facility] Progress Note Patient: Kirsten Mcmahon VWU:[id number] DOB: 02-04-1946 DOA: 05/31/2023  DOS: the patient was seen and examined on 06/01/2023  Brief hospital course: PMH of ESRD on HD MWF, GERD, hypothyroidism, CVA with left hemiplegia, HLD, seizures on Vimpat, HTN present to the hospital with complaints of seizures. Neurology, nephrology consulted. Currently on LTM EEG monitoring. Assessment and Plan: Recurrent seizures Presents with multiple episodes of seizures during HD. Unable to complete HD secondary to the same. Neurology consulted. Currently undergoing LTM EEG. IV Ativan as needed. On Vimpat.  Dose increased due to her encephalopathy. Currently remaining n.p.o. due to her encephalopathy.   End-stage renal disease on hemodialysis  HD MWF. Unable to complete her HD prior to admission. Will be undergoing HD on Wednesday.   Hypothyroidism We will continue Synthroid.   Essential hypertension We will continue Toprol-XL.   History of CVA (cerebrovascular accident) We will continue aspirin.   History of breast cancer We will continue Arimidex.  Hypoglycemia. Likely in the setting of poor p.o. intake secondary to seizures. Intractable overcorrected with D10 Monitor CBG every 4 hours. Hold after CBG x 2 more than 200.   Subjective: Called at bedside to evaluate the patient due to patient being less responsive.  No nausea no vomiting.  No seizures event so far.  Received IV Ativan at 4:43 AM.  Blood sugars were low.  Physical Exam: General: in Mild distress, No Rash Cardiovascular: S1 and S2 Present, No Murmur Respiratory: Good respiratory effort, Bilateral Air entry present. No Crackles, No wheezes Abdomen: Bowel Sound present, No tenderness Extremities: No edema Neuro: Alert and nonverbal, moans and withdraws to painful stimuli, follows commands.  Left-sided weakness.  Data Reviewed: I have Reviewed nursing notes, Vitals, and Lab results. Since last  encounter, pertinent lab results CBC and BMP   . I have ordered test including CBC and BMP  . I have discussed pt's care plan and test results with neurology nephrology  .   Disposition: Status is: Inpatient Remains inpatient appropriate because: Needing HD, needing IV dextrose, improvement in mentation  Place and maintain sequential compression device Start: 06/01/23 0841   Family Communication: No one at bedside Level of care: Telemetry Medical   Vitals:   06/01/23 1719 06/01/23 1730 06/01/23 1800 06/01/23 1829  BP:  126/68 115/68 136/69  Pulse:  (!) 45 66 82  Resp:  20 18   Temp:      TempSrc: Oral     SpO2:  100% 100% 100%  Weight:      Height:         Author: Lynden Oxford, MD 06/01/2023 6:57 PM  Please look on [email or web address] to find out who is on call.

## 2023-06-01 NOTE — Progress Notes (Signed)
   06/01/23 2024  Vitals  Temp 97.9 F (36.6 C)  Temp Source Oral  BP 125/65  MAP (mmHg) 84  BP Location Left Arm  BP Method Automatic  Patient Position (if appropriate) Lying  Pulse Rate 80  Pulse Rate Source Monitor  ECG Heart Rate 85  Resp 19  Oxygen Therapy  SpO2 99 %  O2 Device Room Air  Patient Activity (if Appropriate) In bed  Pulse Oximetry Type Continuous  During Treatment Monitoring  Intra-Hemodialysis Comments Tx completed  Post Treatment  Dialyzer Clearance Clear  Duration of HD Treatment -hour(s) 3.45 hour(s)  Hemodialysis Intake (mL) 0 mL  Liters Processed 74.3  Fluid Removed (mL) 0 mL  Tolerated HD Treatment Yes  AVG/AVF Arterial Site Held (minutes) 12 minutes  AVG/AVF Venous Site Held (minutes) 12 minutes  Fistula / Graft Right Upper arm Arteriovenous vein graft  Placement Date/Time: 09/29/21 1023   Placed prior to admission: No  Orientation: Right  Access Location: Upper arm  Access Type: Arteriovenous vein graft  Site Condition No complications  Fistula / Graft Assessment Present;Thrill;Bruit  Status Deaccessed  Needle Size 15   To [employer]

## 2023-06-01 NOTE — Progress Notes (Signed)
Pt receives out-pt HD at [facility] on MWF. Will assist as needed.   Helina Hullum Renal Navigator [phone] 

## 2023-06-01 NOTE — Hospital Course (Addendum)
Brief hospital course: PMH of ESRD on HD MWF, GERD, hypothyroidism, CVA with left hemiplegia, HLD, seizures on Vimpat, HTN present to the hospital with complaints of seizures. Neurology, nephrology consulted. Was on LTM EEG monitoring. Assessment and Plan: Recurrent seizures Presents with multiple episodes of seizures during HD. Unable to complete HD secondary to the same. Neurology consulted. Was undergoing LTM EEG. IV Ativan as needed. On Vimpat.  Dose increased due to her encephalopathy. Mentation significantly better.  LTM EEG discontinued on 6/27. Speech was able to advance the diet to regular food. PT OT evaluation recommend rehab at pace.   End-stage renal disease on hemodialysis  HD MWF. Unable to complete her HD prior to admission.  Tolerated HD in the hospital without any seizures. Discussed with nephrology and requested HD for Monday.   Hypothyroidism We will continue Synthroid.  Will give IV Synthroid for today. Switch to p.o.  Tomorrow.  Intractable nausea and vomiting. Constipation. Had 2 episodes of vomiting so far. X-ray shows mild constipation. Will provide bowel regimen. Actually had 2 BM so far today. Will also place on scheduled Reglan.   Essential hypertension We will continue Toprol-XL.   History of CVA (cerebrovascular accident) We will continue aspirin.   History of breast cancer We will continue Arimidex.  Hypoglycemia. Treated with D10.  Now improving.  Will stop and monitor.

## 2023-06-01 NOTE — Significant Event (Signed)
Rapid Response Event Note   Reason for Call :  Unresponsive to sternal rub  Initial Focused Assessment:  Upon my arrival she is lying in bed with her eyes closed.  She is in no distress.  She has a good gag reflex and will wake up and respond verbally.  She does continue to keep her eyes closed.   She moves her right side purposefully. She is warm and dry.  BP 116/58  HR 66  RR 16  O2 sat 100% CBG 69   Interventions:  D50  Plan of Care:     Event Summary:   MD Notified: Allena Katz Call Time: 0981 Arrival Time: 0825 End Time: 1914  Marcellina Millin, RN

## 2023-06-01 NOTE — Consult Note (Signed)
Renal Service Consult Note [facility]  ADELIS DOCTER 06/01/2023 [name], MD Requesting Physician: Dr. Allena Katz, Demetrius Charity.   Reason for Consult: ESRD pt w/ seizures HPI: The patient is a 77 y.o. year-old w/ PMH as below who presented to [facility] ED on 05/30/23 brought by EMS from HD unit where pt had a seizure while on HD (1-2 hrs into HD). Lasted about 1 minute, hx of seizures. HD was aborted and pt sent to ED by EMS. H/o prior CVA w/ L hemiparesis and h/o seizures on vimpat. In ED VS were stable and SpO2 99%. Labs showed creat 9.4, BUN 46, K+ 3.2, Ca 8.3, phos 5.8, alb 3.5. WBC 7K, Hb 11. Head CT showed old infarctions, no acute findings. Pt was transferred to [facility] for LTM EEG. We are asked to see for dialysis.   Pt seen in room. She is somnolent but arousable. Poor historian. Spoke w/ the staff at her HD unit in [place], she was on HD for about 2 hrs when she had the seizure on Monday 6/24.   ROS - n/a  Past Medical History  Past Medical History:  Diagnosis Date   Cancer (HCC) 2017   Right breast   Chronic kidney disease    progression to ESRD 04/10/21   GERD (gastroesophageal reflux disease)    Glaucoma    Hemiparesis (HCC)    left side   High cholesterol    History of seizure    after a spider bite; 07/15/21   History of stroke with residual deficit    left-side weakness   Hypertension    states BP under control with meds., has been on med. x 2 yr.   Hypothyroidism    Non-insulin dependent type 2 diabetes mellitus (HCC)    Overactive bladder    PFO (patent foramen ovale) 05/15/2021   Stroke (HCC)    1998 weakness on left side; 05/12/21   Past Surgical History  Past Surgical History:  Procedure Laterality Date   ABDOMINAL HYSTERECTOMY     complete   AV FISTULA PLACEMENT Right 09/29/2021   Procedure: INSERTION OF RIGHT ARM ARTERIOVENOUS (AV) GORE-TEX GRAFT;  Surgeon: Chuck Hint, MD;  Location: [facility] OR;  Service: Vascular;  Laterality: Right;    BREAST BIOPSY Left 08/03/2018   Benign adipose tissue   BREAST BIOPSY Right 02/15/2023   [place] RT BREAST BX W LOC DEV 1ST LESION IMG BX SPEC US GUIDE 02/15/2023 [facility]   BREAST BIOPSY Left 02/15/2023   [place] LT BREAST BX W LOC DEV 1ST LESION IMG BX SPEC US GUIDE 02/15/2023 [facility] MAMMOGRAPHY   BREAST EXCISIONAL BIOPSY Right 2014   Positive   BREAST LUMPECTOMY Right    BUBBLE STUDY  05/15/2021   Procedure: BUBBLE STUDY;  Surgeon: Jake Bathe, MD;  Location: [facility] ENDOSCOPY;  Service: Cardiovascular;;   CATARACT EXTRACTION W/ INTRAOCULAR LENS IMPLANT Left    CEREBRAL ANEURYSM REPAIR  1998   DIALYSIS/PERMA CATHETER INSERTION N/A 04/10/2021   Procedure: DIALYSIS/PERMA CATHETER INSERTION;  Surgeon: Annice Needy, MD;  Location: [facility] INVASIVE CV LAB;  Service: Cardiovascular;  Laterality: N/A;   IR FLUORO GUIDE CV LINE RIGHT  04/30/2021   PICC LINE INSERTION     TEE WITHOUT CARDIOVERSION N/A 05/15/2021   Procedure: TRANSESOPHAGEAL ECHOCARDIOGRAM (TEE);  Surgeon: Jake Bathe, MD;  Location: [facility] ENDOSCOPY;  Service: Cardiovascular;  Laterality: N/A;   THYROID LOBECTOMY Right 07/15/2017   Procedure: RIGHT THYROID LOBECTOMY;  Surgeon: Darnell Level, MD;  Location:  [facility] OR;  Service: General;  Laterality: Right;   Family History  Family History  Problem Relation Age of Onset   Cancer Brother        possible prostate cancer per her daughter   Breast cancer Neg Hx    Social History  reports that she has never smoked. She has never used smokeless tobacco. She reports that she does not drink alcohol and does not use drugs. Allergies  Allergies  Allergen Reactions   Contrast Media [Iodinated Contrast Media] Swelling   Latex Itching   Shellfish-Derived Products Other (See Comments)    Other reaction(s): NO ALLERGY   Levemir [Insulin Detemir] Itching   Home medications Prior to Admission medications   Medication Sig Start Date End Date Taking? Authorizing Provider  acetaminophen (TYLENOL) 500 MG  tablet Take 1,000 mg by mouth daily.    [provider]  alum & mag hydroxide-simeth (MAALOX PLUS) 400-400-40 MG/5ML suspension Take 10 mLs by mouth 4 (four) times daily as needed for indigestion.    [provider]  amoxicillin-clavulanate (AUGMENTIN) 875-125 MG tablet Take 1 tablet by mouth every 12 (twelve) hours. Patient not taking: Reported on 05/31/2023 12/27/22   Tanda Rockers A, DO  anastrozole (ARIMIDEX) 1 MG tablet Take 1 tablet (1 mg total) by mouth daily. 03/17/23   Serena Croissant, MD  aspirin 81 MG chewable tablet Chew 1 tablet (81 mg total) by mouth daily. 08/01/21   Rolly Salter, MD  azithromycin (ZITHROMAX) 250 MG tablet Take 1 tablet (250 mg total) by mouth daily. Take first 2 tablets together, then 1 every day until finished. Patient not taking: Reported on 05/31/2023 12/27/22   Tanda Rockers A, DO  B Complex-C-Zn-Folic Acid (DIALYVITE 800 WITH ZINC) 0.8 MG TABS Take 1 tablet by mouth daily. Patient not taking: Reported on 05/31/2023 01/13/22   [provider]  calcitRIOL (ROCALTROL) 0.5 MCG capsule Take 1 capsule (0.5 mcg total) by mouth every Monday, Wednesday, and Friday with hemodialysis. 07/31/21   Rolly Salter, MD  dextrose (GLUTOSE) 40 % GEL Place 28 mLs inside cheek as needed (for suspected low sugars). 07/31/21   Rolly Salter, MD  donepezil (ARICEPT) 10 MG tablet Take 10 mg by mouth daily. 06/28/22   [provider]  famotidine (PEPCID) 10 MG tablet Take 10 mg by mouth daily.    [provider]  lacosamide 100 MG TABS Take 1 tablet (100 mg total) by mouth 2 (two) times daily. 07/31/21   Rolly Salter, MD  latanoprost (XALATAN) 0.005 % ophthalmic solution Place 1 drop into both eyes at bedtime. 09/07/18   Tommie Sams, DO  levothyroxine (SYNTHROID, LEVOTHROID) 112 MCG tablet Take 1 tablet (112 mcg total) by mouth daily before breakfast. 09/07/18   Tommie Sams, DO  lidocaine-prilocaine (EMLA) cream Apply 1 application. topically 3  (three) times a week. 02/04/22   [provider]  metoprolol succinate (TOPROL-XL) 50 MG 24 hr tablet Take 50 mg by mouth daily. 08/20/20   [provider]  multivitamin (RENA-VIT) TABS tablet Take 1 tablet by mouth at bedtime. Patient not taking: Reported on 05/31/2023 07/31/21   Rolly Salter, MD  Nutritional Supplements (,FEEDING SUPPLEMENT, PROSOURCE PLUS) liquid Take 30 mLs by mouth 3 (three) times daily between meals. 07/31/21   Rolly Salter, MD  Nutritional Supplements (FEEDING SUPPLEMENT, BOOST BREEZE,) LIQD Take 1 each by mouth 3 (three) times daily. 07/31/21   Rolly Salter, MD  sucroferric oxyhydroxide (VELPHORO) 500 MG  chewable tablet Chew 500 mg by mouth 3 (three) times daily with meals. 10/13/21   [provider]     Vitals:   06/01/23 0102 06/01/23 0733 06/01/23 0814 06/01/23 1105  BP: 127/73 (!) 109/58 116/73 114/67  Pulse: 78 73 71 67  Resp:  20 15 16   Temp: 97.8 F (36.6 C) 97.6 F (36.4 C) (!) 97.5 F (36.4 C) 97.7 F (36.5 C)  TempSrc: Oral Axillary Axillary Axillary  SpO2: 99% 100% 100% 95%  Weight:      Height:       Exam Gen alert, no distress, le5/29, thargic, on LTM EEG monitoring No rash, cyanosis or gangrene Sclera anicteric, throat clear  No jvd or bruits Chest clear bilat to bases, no rales/ wheezing RRR no MRG Abd soft ntnd no mass or ascites +bs GU defer MS no joint effusions or deformity Ext no LE or UE edema, no wounds or ulcers Neuro is alert, Ox 3 , nf    RUA AVG + bruit      Home meds include - anastrozole, asa, dialyvite, donepezil, famotirdine, lacosamide, synthroid, toprol xl 50 every day, renavite, velphoro 500mg  po ac tid, prns/ vits/ supps     OP HD: MWF [facility]  [address]    3h  350/ 1.5  59.3kg  2/2.5 bath  Heparin 2000  AVG   - pt missed 6/21 HD and 6/24 HD was aborted at 2 hrs due to seizure as above - otherwise her compliance has been good looking back the last 2 weeks - last OP HD  6/19, post wt 58.5kg   - rocaltrol 0 75 mcg po three times per week - venofer 50mg  weekly IV - mircera 75 mcg IV q 4 wks, last 5/29, due 6/26 (today)   Assessment/ Plan: Seizures - in pt w/ hx of seizures taking Vimpat at home. Happened in middle of HD session. Had missed 1 session last week, otherwise had not missed in the prior 1.5 wks, stays on the machine. Creat up 10.9 here, her b/l creat is about 4-7. Dialysis dysequilibrium is usually associated when pt misses multiple HD sessions in a row. Also, uremia is unlikely after missing only 1 HD session (6/21). Getting LTM EEG at this time. Per neurology.  ESRD - on HD MWF. Missed last Friday session, got 1/2 session on Monday 6/24. Plan for HD today or tonight.  HTN - bp's are soft to wnl. Holding home toprol xl.  Volume - no vol excess on exam. Under dry wt 1kg. Plan to keep even w/ next HD.  Anemia esrd - Hb in 11-12 range. Esa due today but given high Hb would hold for now.   MBD ckd - Ca/P in range. Cont binders and po vdra when eating.  H/o CVA Hypothyroidism       Vinson Moselle  MD [facility] 06/01/2023, 12:15 PM  Recent Labs  Lab 05/25/23 1732 05/30/23 1524 05/31/23 0758 06/01/23 0428  HGB 11.8*   < > 11.1* 11.1*  ALBUMIN 3.6  --  3.5  --   CALCIUM 9.2   < > 8.3* 8.5*  PHOS  --   --  5.8*  --   CREATININE 4.26*   < > 9.41* 10.93*  K 3.2*   < > 3.2* 3.6   < > = values in this interval not displayed.   Inpatient medications:  chlorhexidine gluconate (MEDLINE KIT)  15 mL Mouth Rinse BID   mouth rinse  15 mL Mouth  Rinse UD    dextrose 10 % and 0.45 % NaCl 1,000 mL with potassium chloride 20 mEq infusion 50 mL/hr at 06/01/23 1139   lacosamide (VIMPAT) IV 100 mg (06/01/23 9147)   acetaminophen **OR** acetaminophen, LORazepam, ondansetron **OR** ondansetron (ZOFRAN) IV

## 2023-06-01 NOTE — Progress Notes (Signed)
Subjective: No further seizure-like activity overnight.  ROS: negative except above  Examination  Vital signs in last 24 hours: Temp:  [97.5 F (36.4 C)-98.1 F (36.7 C)] 97.7 F (36.5 C) (06/26 1105) Pulse Rate:  [67-88] 67 (06/26 1105) Resp:  [15-20] 16 (06/26 1105) BP: (109-146)/(58-81) 114/67 (06/26 1105) SpO2:  [95 %-100 %] 95 % (06/26 1105) Weight:  [58.3 kg] 58.3 kg (06/25 2106)  General: lying in bed, NAD Neuro: Prefers to keep eyes closed during the exam but did tell me her name and follow command (sticking out her tongue) on passive eye opening, pupils were equal and reactive to light, did not have any forced gaze deviation.  Had increased tone in left upper extremity and left lower extremity, withdraws to noxious Tamai in bilateral upper extremities with left hemiparesis, withdraws to noxious stimuli in bilateral lower extremities with left hemiparesis  Basic Metabolic Panel: Recent Labs  Lab 05/25/23 1713 05/25/23 1732 05/25/23 1732 05/30/23 1524 05/31/23 0758 05/31/23 2028 06/01/23 0428  NA 133* 137  --  144 139  --  139  K 3.9 3.2*  --  3.5 3.2*  --  3.6  CL 99 91*  --  97* 96*  --  96*  CO2  --  29  --  29 28  --  25  GLUCOSE 127* 126*  --  146* 98  --  101*  BUN 15 11  --  38* 46*  --  52*  CREATININE 4.00* 4.26*  --  8.03* 9.41*  --  10.93*  CALCIUM  --  9.2   < > 9.1 8.3*  --  8.5*  MG  --   --   --   --   --  2.4  --   PHOS  --   --   --   --  5.8*  --   --    < > = values in this interval not displayed.    CBC: Recent Labs  Lab 05/25/23 1713 05/25/23 1732 05/30/23 1524 05/31/23 0758 06/01/23 0428  WBC  --  8.3 11.1* 7.9 8.8  NEUTROABS  --  5.8 9.0*  --   --   HGB 13.3 11.8* 11.5* 11.1* 11.1*  HCT 39.0 37.3 36.3 34.8* 34.1*  MCV  --  102.2* 103.1* 101.5* 102.7*  PLT  --  225 217 207 180     Coagulation Studies: No results for input(s): "LABPROT", "INR" in the last 72 hours.  Imaging CT head without contrast 05/30/2023: No acute finding  by CT. Old infarctions in the right middle cerebral artery territory, right occipital lobe and cerebellum. Distant right frontal craniectomy.  ASSESSMENT AND PLAN: 77 year old female with history of right hemispheric strokes and subsequent left hemiplegia, epilepsy on Vimpat, end-stage renal disease on HD who presented with increased seizure like episodes during dialysis  Epilepsy with breakthrough seizure -No clear etiology.  Recommendations -Will increase Vimpat to 150 mg twice daily. EKG reviewed -If no seizures overnight, will DC LTM EEG tomorrow -Okay to continue LTM EEG during dialysis -Continue seizure precautions -As needed IV Versed for clinical seizures -Management of rest of comorbidities per primary team -Discussed plan with Dr. Allena Katz via secure chat  I have spent a total of 36   minutes with the patient reviewing hospital notes,  test results, labs and examining the patient as well as establishing an assessment and plan.  > 50% of time was spent in direct patient care.    Lindie Spruce [facility] For questions after 5pm please refer to AMION to reach the Neurologist on call

## 2023-06-01 NOTE — Progress Notes (Addendum)
Neuro MD rounding assessing, seizure activity note on EEG  at bedside  @ 0443 Ativan 2 mg given as directed.

## 2023-06-01 NOTE — Plan of Care (Signed)

## 2023-06-01 NOTE — TOC Initial Note (Signed)
Transition of Care [facility]) - Initial/Assessment Note    Patient Details  Name: Kirsten Mcmahon MRN: [id number] Date of Birth: [birth year]  Transition of Care [facility]) CM/SW Contact:    Kermit Balo, RN Phone Number: 06/01/2023, 3:08 PM  Clinical Narrative:                 Pt is from home with her son. [facility] and [facility] on Tuesdays an Thursdays.  She has 14 hours a week of aide services in the home through [facility]. SW with [facility]: [id number]: [phone]  Will need PT/OT evals after she completes EEG to see if needs therapy after d/c.  TOC following.  Expected Discharge Plan: Home w Home Health Services Barriers to Discharge: Continued Medical Work up   Patient Goals and CMS Choice            Expected Discharge Plan and Services   Discharge Planning Services: CM Consult   Living arrangements for the past 2 months: Single Family Home                                      Prior Living Arrangements/Services Living arrangements for the past 2 months: Single Family Home Lives with:: Adult Children Patient language and need for interpreter reviewed:: Yes Do you feel safe going back to the place where you live?: Yes        Care giver support system in place?: Yes (comment) Current home services: DME (wheelchair/ aide through [facility]) Criminal Activity/Legal Involvement Pertinent to Current Situation/Hospitalization: No - Comment as needed  Activities of Daily Living Home Assistive Devices/Equipment: Cane (specify quad or straight), Wheelchair ADL Screening (condition at time of admission) Patient's cognitive ability adequate to safely complete daily activities?: Yes Is the patient deaf or have difficulty hearing?: No Does the patient have difficulty seeing, even when wearing glasses/contacts?: No Does the patient have difficulty concentrating, remembering, or making decisions?: Yes Patient able to express need for assistance with ADLs?:  Yes Does the patient have difficulty dressing or bathing?: Yes Independently performs ADLs?: No Communication: Independent Dressing (OT): Needs assistance Is this a change from baseline?: Pre-admission baseline Grooming: Needs assistance Is this a change from baseline?: Pre-admission baseline Feeding: Independent Bathing: Needs assistance Is this a change from baseline?: Pre-admission baseline Toileting: Needs assistance Is this a change from baseline?: Pre-admission baseline In/Out Bed: Needs assistance Is this a change from baseline?: Pre-admission baseline Walks in Home: Needs assistance Is this a change from baseline?: Change from baseline, expected to last <3 days Does the patient have difficulty walking or climbing stairs?: Yes Weakness of Legs: Both Weakness of Arms/Hands: Both  Permission Sought/Granted                  Emotional Assessment Appearance:: Appears stated age Attitude/Demeanor/Rapport:  (slow to respond)   Orientation: : Oriented to Self, Oriented to Place, Oriented to Situation   Psych Involvement: No (comment)  Admission diagnosis:  Seizures (HCC) [R56.9] Patient Active Problem List   Diagnosis Date Noted   Seizures (HCC) 05/31/2023   History of breast cancer 05/31/2023   History of CVA (cerebrovascular accident) 05/31/2023   Delirium due to known physiological condition 08/03/2021   Seizure (HCC) 07/17/2021   Mixed diabetic hyperlipidemia associated with type 2 diabetes mellitus (HCC) 07/16/2021   Recurrent seizures (HCC) 07/15/2021   Encounter for immunization 07/14/2021  Pain, unspecified 07/13/2021   Hypokalemia 06/02/2021   Other pneumonia, unspecified organism 05/26/2021   Recent cerebrovascular accident (CVA) 05/17/2021   History of CVA with residual deficit 05/17/2021   Cerebral embolism with cerebral infarction 05/13/2021   Physical deconditioning 04/30/2021   Generalized muscle weakness 04/30/2021   Pressure injury of skin  04/29/2021   AKI (acute kidney injury) (HCC) 04/28/2021   Moderate protein-calorie malnutrition (HCC) 04/28/2021   Other bacterial infections of unspecified site 04/28/2021   Streptococcal sepsis, unspecified (HCC) 04/18/2021   Streptococcal bacteremia 04/18/2021   Leukocytosis 04/17/2021   Fever 04/17/2021   Allergy, unspecified, initial encounter 04/16/2021   Anaphylactic shock, unspecified, initial encounter 04/16/2021   Complication of vascular dialysis catheter 04/16/2021   Iron deficiency anemia, unspecified 04/16/2021   Pruritus, unspecified 04/16/2021   Secondary hyperparathyroidism of renal origin (HCC) 04/16/2021   Type 2 diabetes mellitus with diabetic peripheral angiopathy without gangrene (HCC) 04/16/2021   End-stage renal disease on hemodialysis (HCC)    Acute kidney injury superimposed on CKD (HCC) 04/09/2021   ARF (acute renal failure) (HCC) 01/18/2021   Fall 01/17/2021   Essential hypertension    Hypothyroidism    Stroke (HCC)    GERD (gastroesophageal reflux disease)    Anemia in chronic kidney disease (CKD)    Acute metabolic encephalopathy    Acute renal failure superimposed on stage 3b chronic kidney disease (HCC)    Type 2 diabetes mellitus with ESRD (end-stage renal disease) (HCC)    Abnormal mammogram of left breast 07/30/2018   Closed displaced oblique fracture of shaft of left humerus 03/14/2018   Osteopenia 01/20/2018   Multiple thyroid nodules 07/13/2017   Tracheal deviation 07/13/2017   Malignant neoplasm of upper-outer quadrant of right breast in female, estrogen receptor positive (HCC) 01/20/2017   Primary vulvar squamous cell carcinoma (HCC) 01/20/2017   PCP:  [facility] [facility] Pharmacy:   [id number] - [place], [place] - [address] AT [facility] [address] [place] [address] Phone: [phone] Fax: [phone]     Social Determinants of Health (SDOH) Social History: SDOH  Screenings   Food Insecurity: No Food Insecurity (05/31/2023)  Housing: Patient Unable To Answer (06/01/2023)  Transportation Needs: No Transportation Needs (05/31/2023)  Utilities: Not At Risk (05/31/2023)  Tobacco Use: Low Risk  (06/01/2023)   SDOH Interventions:     Readmission Risk Interventions    05/20/2021    2:15 PM 05/16/2021   11:27 AM 04/11/2021   10:53 AM  Readmission Risk Prevention Plan  Transportation Screening Complete Complete Complete  PCP or Specialist Appt within 3-5 Days   Complete  HRI or Home Care Consult   Complete  Social Work Consult for Recovery Care Planning/Counseling   Complete  Palliative Care Screening   Not Applicable  Medication Review Oceanographer) Complete Complete Complete  PCP or Specialist appointment within 3-5 days of discharge Complete Complete   HRI or Home Care Consult Complete Complete   SW Recovery Care/Counseling Consult  Complete   Palliative Care Screening Complete Complete   Skilled Nursing Facility Patient Refused Patient Refused

## 2023-06-01 NOTE — Progress Notes (Signed)
SLP Cancellation Note  Patient Details Name: Kirsten Mcmahon MRN: [id number] DOB: March 20, 1946   Cancelled treatment:        Orders for clinical swallow evaluation received and appreciated.  Pt with RR event this AM for unresponsiveness.  Spoke with RN, pt with improvement, but still remains minimally responsive and is not appropriate for PO trials at this time.  SLP will follow for medical readiness for assessment.   Kerrie Pleasure, MA, CCC-SLP Acute Rehabilitation Services Office: [phone] 06/01/2023, 11:51 AM

## 2023-06-02 ENCOUNTER — Other Ambulatory Visit (HOSPITAL_COMMUNITY): Payer: Self-pay

## 2023-06-02 DIAGNOSIS — G40909 Epilepsy, unspecified, not intractable, without status epilepticus: Secondary | ICD-10-CM | POA: Diagnosis not present

## 2023-06-02 LAB — GLUCOSE, CAPILLARY
Glucose-Capillary: 114 mg/dL — ABNORMAL HIGH (ref 70–99)
Glucose-Capillary: 128 mg/dL — ABNORMAL HIGH (ref 70–99)
Glucose-Capillary: 137 mg/dL — ABNORMAL HIGH (ref 70–99)
Glucose-Capillary: 154 mg/dL — ABNORMAL HIGH (ref 70–99)
Glucose-Capillary: 60 mg/dL — ABNORMAL LOW (ref 70–99)
Glucose-Capillary: 65 mg/dL — ABNORMAL LOW (ref 70–99)
Glucose-Capillary: 83 mg/dL (ref 70–99)
Glucose-Capillary: 88 mg/dL (ref 70–99)

## 2023-06-02 LAB — CBC
HCT: 33.7 % — ABNORMAL LOW (ref 36.0–46.0)
Hemoglobin: 10.8 g/dL — ABNORMAL LOW (ref 12.0–15.0)
MCH: 32.6 pg (ref 26.0–34.0)
MCHC: 32 g/dL (ref 30.0–36.0)
MCV: 101.8 fL — ABNORMAL HIGH (ref 80.0–100.0)
Platelets: 193 10*3/uL (ref 150–400)
RBC: 3.31 MIL/uL — ABNORMAL LOW (ref 3.87–5.11)
RDW: 13.7 % (ref 11.5–15.5)
WBC: 7 10*3/uL (ref 4.0–10.5)
nRBC: 0 % (ref 0.0–0.2)

## 2023-06-02 LAB — RENAL FUNCTION PANEL
Albumin: 3 g/dL — ABNORMAL LOW (ref 3.5–5.0)
Anion gap: 12 (ref 5–15)
BUN: 16 mg/dL (ref 8–23)
CO2: 28 mmol/L (ref 22–32)
Calcium: 8.3 mg/dL — ABNORMAL LOW (ref 8.9–10.3)
Chloride: 96 mmol/L — ABNORMAL LOW (ref 98–111)
Creatinine, Ser: 5.4 mg/dL — ABNORMAL HIGH (ref 0.44–1.00)
GFR, Estimated: 8 mL/min — ABNORMAL LOW (ref >60)
Glucose, Bld: 81 mg/dL (ref 70–99)
Phosphorus: 3.6 mg/dL (ref 2.5–4.6)
Potassium: 3.4 mmol/L — ABNORMAL LOW (ref 3.5–5.1)
Sodium: 136 mmol/L (ref 135–145)

## 2023-06-02 LAB — MAGNESIUM: Magnesium: 1.7 mg/dL (ref 1.7–2.4)

## 2023-06-02 MED ORDER — DEXTROSE 50 % IV SOLN
INTRAVENOUS | Status: AC
  Administered 2023-06-02: 25 mL via INTRAVENOUS
  Filled 2023-06-02: qty 50

## 2023-06-02 MED ORDER — DEXTROSE 50 % IV SOLN
12.5000 g | INTRAVENOUS | Status: AC
  Administered 2023-06-02: 12.5 g via INTRAVENOUS

## 2023-06-02 MED ORDER — DEXTROSE 10 % IV SOLN: INTRAVENOUS | Status: DC

## 2023-06-02 MED ORDER — CHLORHEXIDINE GLUCONATE CLOTH 2 % EX PADS
6.0000 | MEDICATED_PAD | Freq: Every day | CUTANEOUS | Status: DC
  Administered 2023-06-03 – 2023-06-06 (×4): 6 via TOPICAL

## 2023-06-02 MED ORDER — POTASSIUM CHLORIDE CRYS ER 10 MEQ PO TBCR
20.0000 meq | EXTENDED_RELEASE_TABLET | Freq: Once | ORAL | Status: AC
  Administered 2023-06-02: 20 meq via ORAL
  Filled 2023-06-02: qty 2

## 2023-06-02 MED ORDER — DEXTROSE 50 % IV SOLN
INTRAVENOUS | Status: AC
  Filled 2023-06-02: qty 50

## 2023-06-02 NOTE — Progress Notes (Signed)
Hypoglycemic Event  CBG: 65  Treatment: D50 25 mL (12.5 gm)  Symptoms: None  Follow-up CBG: [id number] CBG Result:137  Possible Reasons for Event: Inadequate meal intake  Comments/MD notified:started on D10 at 20 ml/hr    Thom Chimes

## 2023-06-02 NOTE — Evaluation (Signed)
Clinical/Bedside Swallow Evaluation Patient Details  Name: Kirsten Mcmahon MRN: [id number] Date of Birth: 29-Jul-1946  Today's Date: 06/02/2023 Time: SLP Start Time (ACUTE ONLY): 1134 SLP Stop Time (ACUTE ONLY): 1143 SLP Time Calculation (min) (ACUTE ONLY): 9 min  Past Medical History:  Past Medical History:  Diagnosis Date   Cancer (HCC) 2017   Right breast   Chronic kidney disease    progression to ESRD 04/10/21   GERD (gastroesophageal reflux disease)    Glaucoma    Hemiparesis (HCC)    left side   High cholesterol    History of seizure    after a spider bite; 07/15/21   History of stroke with residual deficit    left-side weakness   Hypertension    states BP under control with meds., has been on med. x 2 yr.   Hypothyroidism    Non-insulin dependent type 2 diabetes mellitus (HCC)    Overactive bladder    PFO (patent foramen ovale) 05/15/2021   Stroke (HCC)    1998 weakness on left side; 05/12/21   Past Surgical History:  Past Surgical History:  Procedure Laterality Date   ABDOMINAL HYSTERECTOMY     complete   AV FISTULA PLACEMENT Right 09/29/2021   Procedure: INSERTION OF RIGHT ARM ARTERIOVENOUS (AV) GORE-TEX GRAFT;  Surgeon: Chuck Hint, MD;  Location: [facility] OR;  Service: Vascular;  Laterality: Right;   BREAST BIOPSY Left 08/03/2018   Benign adipose tissue   BREAST BIOPSY Right 02/15/2023   [place] RT BREAST BX W LOC DEV 1ST LESION IMG BX SPEC US GUIDE 02/15/2023 [facility]   BREAST BIOPSY Left 02/15/2023   [place] LT BREAST BX W LOC DEV 1ST LESION IMG BX SPEC US GUIDE 02/15/2023 [facility]   BREAST EXCISIONAL BIOPSY Right 2014   Positive   BREAST LUMPECTOMY Right    BUBBLE STUDY  05/15/2021   Procedure: BUBBLE STUDY;  Surgeon: Jake Bathe, MD;  Location: [facility] ENDOSCOPY;  Service: Cardiovascular;;   CATARACT EXTRACTION W/ INTRAOCULAR LENS IMPLANT Left    CEREBRAL ANEURYSM REPAIR  1998   DIALYSIS/PERMA CATHETER INSERTION N/A 04/10/2021   Procedure:  DIALYSIS/PERMA CATHETER INSERTION;  Surgeon: Annice Needy, MD;  Location: [facility] INVASIVE CV LAB;  Service: Cardiovascular;  Laterality: N/A;   IR FLUORO GUIDE CV LINE RIGHT  04/30/2021   PICC LINE INSERTION     TEE WITHOUT CARDIOVERSION N/A 05/15/2021   Procedure: TRANSESOPHAGEAL ECHOCARDIOGRAM (TEE);  Surgeon: Jake Bathe, MD;  Location: [facility] ENDOSCOPY;  Service: Cardiovascular;  Laterality: N/A;   THYROID LOBECTOMY Right 07/15/2017   Procedure: RIGHT THYROID LOBECTOMY;  Surgeon: Darnell Level, MD;  Location: [facility] OR;  Service: General;  Laterality: Right;   HPI:  Kirsten Mcmahon is a 77 y.o. African-American female who presented to the emergency room with acute onset of increasing frequency of seizures especially during her hemodialysis with subsequent incomplete dialysis sessions and associated altered mental status. CXR 6/24 with no airspace disease.  Head CT 6/24 with no acute findings. Pt with medical history significant for recurrent breast cancer, ESRD on hemodialysis, left hemiplegia from a prior right MCA stroke, dyslipidemia, focal seizures on Vimpat, hypertension, GERD and hypothyroidism,    Assessment / Plan / Recommendation  Clinical Impression  Pt presents with functional swallowing as assessed clinically.  Pt tolerated all consistencies trialed, including large, serial straw sips of thin liquid, with no clinical s/s of aspiration.  Pt exhibited good oral clerance of solids. She is eager to have a hamburger.  Pt has no further ST needs at this time.  SLP will sign off.    Recommend regular texture diet with thin liquids.   SLP Visit Diagnosis: Dysphagia, unspecified (R13.10)    Aspiration Risk  No limitations    Diet Recommendation Regular;Thin liquid    Medication Administration: Whole meds with liquid Supervision: Patient able to self feed Compensations: Slow rate;Small sips/bites Postural Changes: Seated upright at 90 degrees    Other  Recommendations Oral Care  Recommendations: Oral care BID    Recommendations for follow up therapy are one component of a multi-disciplinary discharge planning process, led by the attending physician.  Recommendations may be updated based on patient status, additional functional criteria and insurance authorization.  Follow up Recommendations No SLP follow up      Assistance Recommended at Discharge  N/A   Functional Status Assessment Patient has not had a recent decline in their functional status  Frequency and Duration  (N/A)          Prognosis Prognosis for improved oropharyngeal function:  (N/A)      Swallow Study   General Date of Onset: 05/30/23 HPI: Kirsten Mcmahon is a 77 y.o. African-American female who presented to the emergency room with acute onset of increasing frequency of seizures especially during her hemodialysis with subsequent incomplete dialysis sessions and associated altered mental status. CXR 6/24 with no airspace disease.  Head CT 6/24 with no acute findings. Pt with medical history significant for recurrent breast cancer, ESRD on hemodialysis, left hemiplegia from a prior right MCA stroke, dyslipidemia, focal seizures on Vimpat, hypertension, GERD and hypothyroidism, Type of Study: Bedside Swallow Evaluation Previous Swallow Assessment: None Diet Prior to this Study: Dysphagia 1 (pureed);Thin liquids (Level 0) Temperature Spikes Noted: No History of Recent Intubation: No Behavior/Cognition: Alert;Cooperative;Pleasant mood Oral Cavity Assessment: Within Functional Limits Oral Care Completed by SLP: No Oral Cavity - Dentition: Missing dentition;Poor condition Vision: Functional for self-feeding Self-Feeding Abilities: Able to feed self Patient Positioning: Upright in bed Baseline Vocal Quality: Normal Volitional Cough: Strong Volitional Swallow: Able to elicit    Oral/Motor/Sensory Function Overall Oral Motor/Sensory Function: Mild impairment Facial ROM: Reduced right Facial  Symmetry: Abnormal symmetry right Lingual ROM: Within Functional Limits Lingual Symmetry: Abnormal symmetry left Lingual Strength: Reduced Velum: Within Functional Limits Mandible: Within Functional Limits   Ice Chips Ice chips: Not tested   Thin Liquid Thin Liquid: Within functional limits Presentation: Straw    Nectar Thick Nectar Thick Liquid: Not tested   Honey Thick Honey Thick Liquid: Not tested   Puree Puree: Within functional limits Presentation: Spoon   Solid     Solid: Within functional limits Presentation: Self Fed      Kerrie Pleasure, MA, CCC-SLP Acute Rehabilitation Services Office: [phone] 06/02/2023,11:54 AM

## 2023-06-02 NOTE — Progress Notes (Signed)
LTM EEG discontinued - no skin breakdown at unhook.   

## 2023-06-02 NOTE — Procedures (Addendum)
Patient Name: Kirsten Mcmahon  MRN: [id number]  Epilepsy Attending: [name]  Referring Physician/Provider: Caryl Pina, MD  Duration: 06/02/2023 2038 to 06/02/2023 1129   Patient history: 77 year old female with a history of right MCA stroke and left hemiplegia admitted due to increasing frequency of seizures hindering dialysis sessions. EEG to evaluate for seizure.   Level of alertness: Awake, sleep   AEDs during EEG study: LCM   Technical aspects: This EEG study was done with scalp electrodes positioned according to the 10-20 International system of electrode placement. Electrical activity was reviewed with band pass filter of 1-70Hz , sensitivity of 7 uV/mm, display speed of 37mm/sec with a 60Hz  notched filter applied as appropriate. EEG data were recorded continuously and digitally stored.  Video monitoring was available and reviewed as appropriate.   Description: : The posterior dominant rhythm consists of 8Hz  activity of moderate voltage (25-35 uV) seen predominantly in posterior head regions, symmetric and reactive to eye opening and eye closing. Sleep was characterized by vertex waves, sleep spindles (12-14 Hz, maximal frontocentral region. EEG showed continuous generalized and lateralized right hemisphere 3 to 6 Hz theta-delta slowing. There are also sharply contoured waves in the right centro-parietal region consistent with underlying breach artifact. Spikes were noted in right>left frontotemporal region.   ABNORMALITY -Spike, right>left frontotemporal region. -Breach artifact, right centro-parietal region -Continuous slow, generalized and lateralized right hemisphere   IMPRESSION: This study is consistent with patient's history of epilepsy arising from right>left frontotemporal region. Additionally there is evidence of cortical dysfunction in right centro-parietal region consistent with prior craniotomy and underlying stroke. Lastly there is also moderate degree of  encephalopathy.  No seizures were seen throughout the recording.   Oluchi Pucci Annabelle Harman

## 2023-06-02 NOTE — Progress Notes (Signed)
[facility] Progress Note  Subjective: seen in room, alert and talking this am, a bit disoriented still   Vitals:   06/01/23 2356 06/02/23 0414 06/02/23 1113 06/02/23 1505  BP: 119/63 112/60 124/63 136/76  Pulse: 94 81 73 81  Resp: 18 18 18 16   Temp: 98.9 F (37.2 C) 98.4 F (36.9 C) 98.6 F (37 C) 98.6 F (37 C)  TempSrc: Oral Oral Oral Oral  SpO2: 91% 99% 100% 100%  Weight:      Height:        Exam: Gen alert, no distress No jvd or bruits Chest clear bilat to bases RRR no MRG Abd soft ntnd no mass or ascites +bs MS no joint effusions or deformity Ext no LE edema Neuro is alert, Ox 1.5    RUA AVG + bruit         Home meds include - anastrozole, asa, dialyvite, donepezil, famotirdine, lacosamide, synthroid, toprol xl 50 every day, renavite, velphoro 500mg  po ac tid, prns/ vits/ supps        OP HD: MWF [facility]  [address]    3h  350/ 1.5  59.3kg  2/2.5 bath  Heparin 2000  AVG   - pt missed 6/21 HD and 6/24 HD was aborted at 2 hrs due to seizure as above - otherwise her compliance has been good looking back the last 2 weeks - last OP HD 6/19, post wt 58.5kg   - rocaltrol 0 75 mcg po three times per week - venofer 50mg  weekly IV - mircera 75 mcg IV q 4 wks, last 5/29, due 6/26 (today)     Assessment/ Plan: Seizures - in pt w/ hx of seizures. Not sure she was taking Vimpat at home. Happened in middle of HD session. Had missed 1 session last week, otherwise had not missed in the prior 1.5 wks. Had HD here yesterday. Pt is alert today but still a bit altered.  ESRD - on HD MWF. Missed last Friday session, got 1/2 session on Monday 6/24. Had HD here last night overnight. Next HD tomorrow.  HTN - bp's are soft to wnl. Holding home toprol xl.  Volume - no vol excess on exam. Under dry wt 1kg. No UF w/ HD last night.  Anemia esrd - Hb in 11-12 range. Esa due today but given high Hb would hold for now.   MBD ckd - Ca/P in range. Cont binders and  po vdra when eating.  H/o CVA Hypothyroidism         Vinson Moselle MD  [facility] 06/02/2023, 3:23 PM  Recent Labs  Lab 05/31/23 0758 06/01/23 0428 06/02/23 0704  HGB 11.1* 11.1* 10.8*  ALBUMIN 3.5  --  3.0*  CALCIUM 8.3* 8.5* 8.3*  PHOS 5.8*  --  3.6  CREATININE 9.41* 10.93* 5.40*  K 3.2* 3.6 3.4*   No results for input(s): "IRON", "TIBC", "FERRITIN" in the last 168 hours. Inpatient medications:  chlorhexidine gluconate (MEDLINE KIT)  15 mL Mouth Rinse BID   Chlorhexidine Gluconate Cloth  6 each Topical Q0600   mouth rinse  15 mL Mouth Rinse UD   potassium chloride  20 mEq Oral Once    dextrose 20 mL/hr at 06/02/23 0841   lacosamide (VIMPAT) IV 150 mg (06/02/23 1043)   acetaminophen **OR** acetaminophen, LORazepam, ondansetron **OR** ondansetron (ZOFRAN) IV

## 2023-06-02 NOTE — Progress Notes (Signed)
[facility] Progress Note Patient: Kirsten Mcmahon ZDG:[id number] DOB: 12-08-1945 DOA: 05/31/2023  DOS: the patient was seen and examined on 06/02/2023  Brief hospital course: PMH of ESRD on HD MWF, GERD, hypothyroidism, CVA with left hemiplegia, HLD, seizures on Vimpat, HTN present to the hospital with complaints of seizures. Neurology, nephrology consulted. Was on LTM EEG monitoring. Assessment and Plan: Recurrent seizures Presents with multiple episodes of seizures during HD. Unable to complete HD secondary to the same. Neurology consulted. Was undergoing LTM EEG. IV Ativan as needed. On Vimpat.  Dose increased due to her encephalopathy. Mentation significantly better.  Will monitor.  LTM EEG discontinued on 6/27. Speech was able to advance the diet to regular food. Will get PT OT evaluation as well.   End-stage renal disease on hemodialysis  HD MWF. Unable to complete her HD prior to admission. Monitor post HD course on Friday.   Hypothyroidism We will continue Synthroid.   Essential hypertension We will continue Toprol-XL.   History of CVA (cerebrovascular accident) We will continue aspirin.   History of breast cancer We will continue Arimidex.  Hypoglycemia. Treated with D10.  Now improving.  Monitor.   Subjective: No events.  Blood sugar level still low.  No nausea no vomiting.  No fever no chills.  Mentation significantly better.  Physical Exam: In mild distress. Clear to auscultation. Bowel sound present. Alert.  Able to tell me her name and her location.  Left-sided weakness.  Data Reviewed: I have Reviewed nursing notes, Vitals, and Lab results. Discussed with neurology and nephrology. Reviewed CBC and BMP. Reordered CBC and BMP.  Disposition: Status is: Inpatient Remains inpatient appropriate because: Needing HD, needing IV dextrose, improvement in mentation  Place and maintain sequential compression device Start: 06/01/23 0841   Family  Communication: No one at bedside, attempt to reach out to son was not successful. Level of care: Telemetry Medical   Vitals:   06/01/23 2356 06/02/23 0414 06/02/23 1113 06/02/23 1505  BP: 119/63 112/60 124/63 136/76  Pulse: 94 81 73 81  Resp: 18 18 18 16   Temp: 98.9 F (37.2 C) 98.4 F (36.9 C) 98.6 F (37 C) 98.6 F (37 C)  TempSrc: Oral Oral Oral Oral  SpO2: 91% 99% 100% 100%  Weight:      Height:         Author: Lynden Oxford, MD 06/02/2023 5:27 PM  Please look on [email or web address] to find out who is on call.

## 2023-06-02 NOTE — Progress Notes (Signed)
vLTM maintenance  All impedances below 10k.  No skin breakdown at  A2  T4 T6  F8

## 2023-06-02 NOTE — Progress Notes (Signed)
0000 - Blood sugar 60mg /dL. D50 water 25mL given per protocol.  0034 - Repeat blood sugar after d50, 128mg /dL

## 2023-06-02 NOTE — Plan of Care (Signed)
  Problem: Clinical Measurements: Goal: Diagnostic test results will improve Outcome: Progressing Goal: Respiratory complications will improve Outcome: Progressing Goal: Cardiovascular complication will be avoided Outcome: Progressing   Problem: Activity: Goal: Risk for activity intolerance will decrease Outcome: Progressing   Problem: Nutrition: Goal: Adequate nutrition will be maintained Outcome: Progressing   

## 2023-06-02 NOTE — Progress Notes (Signed)
Subjective: NAEO. No new concerns.  ROS: negative except above  Examination  Vital signs in last 24 hours: Temp:  [97.4 F (36.3 C)-99.5 F (37.5 C)] 98.6 F (37 C) (06/27 1113) Pulse Rate:  [45-94] 73 (06/27 1113) Resp:  [10-21] 18 (06/27 1113) BP: (111-136)/(56-75) 124/63 (06/27 1113) SpO2:  [91 %-100 %] 100 % (06/27 1113) Weight:  [62.5 kg] 62.5 kg (06/26 1528)  General: lying in bed, NAD Neuro: MS: Alert, oriented to self, place: home, time: august, follows commands CN: pupils equal and reactive,  EOMI, face symmetric, tongue midline, normal sensation over face Motor: 4/5 strength in RUE, RLE, 3/5 in LLE, 2/5 in LUE with increased tone  Basic Metabolic Panel: Recent Labs  Lab 05/30/23 1524 05/31/23 0758 05/31/23 2028 06/01/23 0428 06/02/23 0704  NA 144 139  --  139 136  K 3.5 3.2*  --  3.6 3.4*  CL 97* 96*  --  96* 96*  CO2 29 28  --  25 28  GLUCOSE 146* 98  --  101* 81  BUN 38* 46*  --  52* 16  CREATININE 8.03* 9.41*  --  10.93* 5.40*  CALCIUM 9.1 8.3*  --  8.5* 8.3*  MG  --   --  2.4  --  1.7  PHOS  --  5.8*  --   --  3.6    CBC: Recent Labs  Lab 05/30/23 1524 05/31/23 0758 06/01/23 0428 06/02/23 0704  WBC 11.1* 7.9 8.8 7.0  NEUTROABS 9.0*  --   --   --   HGB 11.5* 11.1* 11.1* 10.8*  HCT 36.3 34.8* 34.1* 33.7*  MCV 103.1* 101.5* 102.7* 101.8*  PLT 217 207 180 193     Coagulation Studies: No results for input(s): "LABPROT", "INR" in the last 72 hours.  Imaging No new brain imaging   ASSESSMENT AND PLAN: 77 year old female with history of right hemispheric strokes and subsequent left hemiplegia, epilepsy on Vimpat, end-stage renal disease on HD who presented with increased seizure like episodes during dialysis   Epilepsy with breakthrough seizure -No clear etiology.   Recommendations -Continue Vimpat 150 mg twice daily.  - Rescue medications: Intranasal valtoco 15mg  for seizure lasting over 2 minutes.  -DC LTM EEG tomorrow as no seizure  overnight -Continue seizure precautions -As needed IV Versed for clinical seizures -Management of rest of comorbidities per primary team -Discussed plan with Dr. Allena Katz via secure chat - F/u with neuro in 3 moths ( order placed)  Seizure precautions: Per [facility] statutes, patients with seizures are not allowed to drive until they have been seizure-free for six months and cleared by a physician    Use caution when using heavy equipment or power tools. Avoid working on ladders or at heights. Take showers instead of baths. Ensure the water temperature is not too high on the home water heater. Do not go swimming alone. Do not lock yourself in a room alone (i.e. bathroom). When caring for infants or small children, sit down when holding, feeding, or changing them to minimize risk of injury to the child in the event you have a seizure. Maintain good sleep hygiene. Avoid alcohol.    If patient has another seizure, call 911 and bring them back to the ED if: A.  The seizure lasts longer than 5 minutes.      B.  The patient doesn't wake shortly after the seizure or has new problems such as difficulty seeing, speaking or moving following the seizure C.  The patient was injured during the seizure D.  The patient has a temperature over 102 F (39C) E.  The patient vomited during the seizure and now is having trouble breathing    During the Seizure   - First, ensure adequate ventilation and place patients on the floor on their left side  Loosen clothing around the neck and ensure the airway is patent. If the patient is clenching the teeth, do not force the mouth open with any object as this can cause severe damage - Remove all items from the surrounding that can be hazardous. The patient may be oblivious to what's happening and may not even know what he or she is doing. If the patient is confused and wandering, either gently guide him/her away and block access to outside areas - Reassure the  individual and be comforting - Call 911. In most cases, the seizure ends before EMS arrives. However, there are cases when seizures may last over 3 to 5 minutes. Or the individual may have developed breathing difficulties or severe injuries. If a pregnant patient or a person with diabetes develops a seizure, it is prudent to call an ambulance. - Finally, if the patient does not regain full consciousness, then call EMS. Most patients will remain confused for about 45 to 90 minutes after a seizure, so you must use judgment in calling for help.    After the Seizure (Postictal Stage)   After a seizure, most patients experience confusion, fatigue, muscle pain and/or a headache. Thus, one should permit the individual to sleep. For the next few days, reassurance is essential. Being calm and helping reorient the person is also of importance.   Most seizures are painless and end spontaneously. Seizures are not harmful to others but can lead to complications such as stress on the lungs, brain and the heart. Individuals with prior lung problems may develop labored breathing and respiratory distress.    I have spent a total of 36 minutes with the patient reviewing hospital notes,  test results, labs and examining the patient as well as establishing an assessment and plan.  > 50% of time was spent in direct patient care.    Lindie Spruce Epilepsy [facility] For questions after 5pm please refer to AMION to reach the Neurologist on call

## 2023-06-03 DIAGNOSIS — G40909 Epilepsy, unspecified, not intractable, without status epilepticus: Secondary | ICD-10-CM | POA: Diagnosis not present

## 2023-06-03 LAB — RENAL FUNCTION PANEL
Albumin: 2.9 g/dL — ABNORMAL LOW (ref 3.5–5.0)
Anion gap: 13 (ref 5–15)
BUN: 29 mg/dL — ABNORMAL HIGH (ref 8–23)
CO2: 28 mmol/L (ref 22–32)
Calcium: 8.2 mg/dL — ABNORMAL LOW (ref 8.9–10.3)
Chloride: 92 mmol/L — ABNORMAL LOW (ref 98–111)
Creatinine, Ser: 7.15 mg/dL — ABNORMAL HIGH (ref 0.44–1.00)
GFR, Estimated: 5 mL/min — ABNORMAL LOW (ref >60)
Glucose, Bld: 100 mg/dL — ABNORMAL HIGH (ref 70–99)
Phosphorus: 4.5 mg/dL (ref 2.5–4.6)
Potassium: 4 mmol/L (ref 3.5–5.1)
Sodium: 133 mmol/L — ABNORMAL LOW (ref 135–145)

## 2023-06-03 LAB — CBC
HCT: 31.2 % — ABNORMAL LOW (ref 36.0–46.0)
Hemoglobin: 10.2 g/dL — ABNORMAL LOW (ref 12.0–15.0)
MCH: 32.3 pg (ref 26.0–34.0)
MCHC: 32.7 g/dL (ref 30.0–36.0)
MCV: 98.7 fL (ref 80.0–100.0)
Platelets: 207 10*3/uL (ref 150–400)
RBC: 3.16 MIL/uL — ABNORMAL LOW (ref 3.87–5.11)
RDW: 13.5 % (ref 11.5–15.5)
WBC: 7.4 10*3/uL (ref 4.0–10.5)
nRBC: 0 % (ref 0.0–0.2)

## 2023-06-03 LAB — GLUCOSE, CAPILLARY
Glucose-Capillary: 107 mg/dL — ABNORMAL HIGH (ref 70–99)
Glucose-Capillary: 115 mg/dL — ABNORMAL HIGH (ref 70–99)
Glucose-Capillary: 129 mg/dL — ABNORMAL HIGH (ref 70–99)
Glucose-Capillary: 82 mg/dL (ref 70–99)

## 2023-06-03 LAB — MAGNESIUM: Magnesium: 1.7 mg/dL (ref 1.7–2.4)

## 2023-06-03 MED ORDER — CALCITRIOL 0.25 MCG PO CAPS
0.7500 ug | ORAL_CAPSULE | ORAL | Status: DC
  Administered 2023-06-03 – 2023-06-06 (×2): 0.75 ug via ORAL
  Filled 2023-06-03 (×2): qty 3

## 2023-06-03 MED ORDER — SUCROFERRIC OXYHYDROXIDE 500 MG PO CHEW
500.0000 mg | CHEWABLE_TABLET | Freq: Three times a day (TID) | ORAL | Status: DC
  Administered 2023-06-04 – 2023-06-06 (×8): 500 mg via ORAL
  Filled 2023-06-03 (×10): qty 1

## 2023-06-03 MED ORDER — HEPARIN SODIUM (PORCINE) 1000 UNIT/ML DIALYSIS
2000.0000 [IU] | Freq: Once | INTRAMUSCULAR | Status: AC
  Administered 2023-06-03: 2000 [IU] via INTRAVENOUS_CENTRAL

## 2023-06-03 MED ORDER — HEPARIN SODIUM (PORCINE) 1000 UNIT/ML DIALYSIS
2000.0000 [IU] | Freq: Once | INTRAMUSCULAR | Status: DC
  Filled 2023-06-03 (×2): qty 2

## 2023-06-03 MED ORDER — HEPARIN SODIUM (PORCINE) 1000 UNIT/ML DIALYSIS: 2000.0000 [IU] | Freq: Once | INTRAMUSCULAR | Status: DC

## 2023-06-03 MED ORDER — LACOSAMIDE 50 MG PO TABS
150.0000 mg | ORAL_TABLET | Freq: Two times a day (BID) | ORAL | Status: DC
  Administered 2023-06-03 – 2023-06-06 (×6): 150 mg via ORAL
  Filled 2023-06-03 (×6): qty 3

## 2023-06-03 NOTE — Progress Notes (Signed)
PT Cancellation Note  Patient Details Name: SURAYA RAJAGOPAL MRN: [id number] DOB: Feb 19, 1946   Cancelled Treatment:    Reason Eval/Treat Not Completed: Patient at procedure or test/unavailable; patient in HD this am.  Will follow up later as able.    Elray Mcgregor 06/03/2023, 9:23 AM Sheran Lawless, PT Acute Rehabilitation Services Office:[phone] 06/03/2023

## 2023-06-03 NOTE — Progress Notes (Signed)
[facility] Progress Note Patient: Kirsten Mcmahon ZOX:[id number] DOB: [birth year] DOA: 05/31/2023  DOS: the patient was seen and examined on 06/03/2023  Brief hospital course: PMH of ESRD on HD MWF, GERD, hypothyroidism, CVA with left hemiplegia, HLD, seizures on Vimpat, HTN present to the hospital with complaints of seizures. Neurology, nephrology consulted. Was on LTM EEG monitoring. Assessment and Plan: Recurrent seizures Presents with multiple episodes of seizures during HD. Unable to complete HD secondary to the same. Neurology consulted. Was undergoing LTM EEG. IV Ativan as needed. On Vimpat.  Dose increased due to her encephalopathy. Mentation significantly better.  LTM EEG discontinued on 6/27. Speech was able to advance the diet to regular food. PT OT evaluation recommend rehab at pace.   End-stage renal disease on hemodialysis  HD MWF. Unable to complete her HD prior to admission.  Tolerated HD in the hospital without any seizures.   Hypothyroidism We will continue Synthroid.   Essential hypertension We will continue Toprol-XL.   History of CVA (cerebrovascular accident) We will continue aspirin.   History of breast cancer We will continue Arimidex.  Hypoglycemia. Treated with D10.  Now improving.  Will stop and monitor.   Subjective: No acute complaint.  No acute events.  No nausea no vomiting.  Tolerated HD.  Physical Exam: In no distress. Clear to auscultation. S1-S2 present. Alert and oriented x 3. Chronic left-sided weakness.  Data Reviewed: I have Reviewed nursing notes, Vitals, and Lab results. Reviewed CBC and BMP.  Disposition: Status is: Inpatient Remains inpatient appropriate because: If stable overnight can be discharged home on 6/29.  Place and maintain sequential compression device Start: 06/01/23 0841   Family Communication: Discussed with son on the phone. Level of care: Telemetry Medical   Vitals:   06/03/23 1215 06/03/23 1240  06/03/23 1400 06/03/23 1527  BP: 118/79 115/70 (!) 144/84 135/77  Pulse: 92 85 100 95  Resp: 18 16 16 17   Temp:  98 F (36.7 C) 98.1 F (36.7 C) 97.9 F (36.6 C)  TempSrc:  Oral Oral Oral  SpO2: 100% 99% 99% 100%  Weight:  61.4 kg    Height:         Author: Lynden Oxford, MD 06/03/2023 6:19 PM  Please look on [email or web address] to find out who is on call.

## 2023-06-03 NOTE — Progress Notes (Signed)
[facility] Progress Note  Subjective: alert , a bit disoriented still   Vitals:   06/03/23 1215 06/03/23 1240 06/03/23 1400 06/03/23 1527  BP: 118/79 115/70 (!) 144/84 135/77  Pulse: 92 85 100 95  Resp: 18 16 16 17   Temp:  98 F (36.7 C) 98.1 F (36.7 C) 97.9 F (36.6 C)  TempSrc:  Oral Oral Oral  SpO2: 100% 99% 99% 100%  Weight:  61.4 kg    Height:        Exam: Gen alert, no distress No jvd or bruits Chest clear bilat to bases RRR no MRG Abd soft ntnd no mass or ascites +bs MS no joint effusions or deformity Ext no LE edema Neuro is alert, Ox 1.5    RUA AVG + bruit         Home meds include - anastrozole, asa, dialyvite, donepezil, famotirdine, lacosamide, synthroid, toprol xl 50 every day, renavite, velphoro 500mg  po ac tid, prns/ vits/ supps        OP HD: MWF [facility]  [address]    3h  350/ 1.5  59.3kg  2/2.5 bath  Heparin 2000  AVG   - pt missed 6/21 HD and 6/24 HD was aborted at 2 hrs due to seizure as above - otherwise her compliance has been good looking back the last 2 weeks - last OP HD 6/19, post wt 58.5kg   - rocaltrol 0.75 mcg po three times per week - venofer 50mg  weekly IV - mircera 75 mcg IV q 4 wks, last 5/29, due 6/26 (today)     Assessment/ Plan: Seizures - in pt w/ hx of seizures. Not sure she was taking Vimpat at home. Happened in middle of HD session. Had missed 1 session last week, otherwise had not missed in the prior 1.5 wks. Had HD here yesterday. Pt remains a bit disoriented.  ESRD - on HD MWF. Missed OP HD Friday last week, and had 1/2 session on Monday 6/24. Back on schedule w/ HD today.  HTN - bp's are soft to wnl. Holding home toprol xl.  Volume - no vol excess on exam. 2-3 kg over dry wt, UF goal 2 L today.  Anemia esrd - Hb in 11-12 range. Esa due today but given Hb > 10 cont to hold for now.   MBD ckd - Ca/P in range. Cont binders and po vdra when eating.  H/o CVA Hypothyroidism     Vinson Moselle MD   [facility] 06/03/2023, 4:28 PM  Recent Labs  Lab 06/02/23 0704 06/03/23 0654  HGB 10.8* 10.2*  ALBUMIN 3.0* 2.9*  CALCIUM 8.3* 8.2*  PHOS 3.6 4.5  CREATININE 5.40* 7.15*  K 3.4* 4.0    No results for input(s): "IRON", "TIBC", "FERRITIN" in the last 168 hours. Inpatient medications:  chlorhexidine gluconate (MEDLINE KIT)  15 mL Mouth Rinse BID   Chlorhexidine Gluconate Cloth  6 each Topical Q0600   Chlorhexidine Gluconate Cloth  6 each Topical Q0600   lacosamide  150 mg Oral BID   mouth rinse  15 mL Mouth Rinse UD     acetaminophen **OR** acetaminophen, LORazepam, ondansetron **OR** ondansetron (ZOFRAN) IV

## 2023-06-03 NOTE — Progress Notes (Signed)
I was called over to re-cannulate arterial. 15g needle use for arterial easy push and pull.

## 2023-06-03 NOTE — Progress Notes (Signed)
OT Cancellation Note  Patient Details Name: CALISE BUTE MRN: [id number] DOB: 10/08/1946   Cancelled Treatment:    Reason Eval/Treat Not Completed: Patient at procedure or test/ unavailable Pt leaving unit for HD this AM. Will follow up for OT eval as schedule permits.   Lorre Munroe 06/03/2023, 7:28 AM

## 2023-06-03 NOTE — Progress Notes (Signed)
Physical Therapy Treatment Patient Details Name: Kirsten Mcmahon MRN: [id number] DOB: July 26, 1946 Today's Date: 06/03/2023   History of Present Illness Patient is a 77 y/o female admitted 05/31/23 due to increased seizures during HD sessions.  She underwent EEG.  PMH positive for R breast cancer, CKD on HD, stroke with L hemiparesis (wheelchair bound), GERD, glaucoma, hypothyroidism.    PT Comments    Patient seen for bed level evaluation due to just back from dialysis and soiled with BM in bed.  Patient needing max to total A for rolling and for assist with bathing.  Son via phone reports she can stand to pivot to wheelchair with assistance and usually has aide to help with showering at home.  Feel she will benefit from skilled PT in the acute setting and from PT follow up at [facility].     Recommendations for follow up therapy are one component of a multi-disciplinary discharge planning process, led by the attending physician.  Recommendations may be updated based on patient status, additional functional criteria and insurance authorization.  Follow Up Recommendations       Assistance Recommended at Discharge Frequent or constant Supervision/Assistance  Patient can return home with the following Two people to help with bathing/dressing/bathroom;A lot of help with walking and/or transfers;Assist for transportation;Direct supervision/assist for medications management;Assistance with cooking/housework;Help with stairs or ramp for entrance   Equipment Recommendations  None recommended by PT    Recommendations for Other Services       Precautions / Restrictions Precautions Precautions: Fall Precaution Comments: L spastic hemiparesis     Mobility  Bed Mobility Overal bed mobility: Needs Assistance Bed Mobility: Rolling Rolling: Max assist, +2 for physical assistance         General bed mobility comments: rolling for bathing and changing bed due to soiled with BM in bed     Transfers                   General transfer comment: NT this session, pt fatigued from dialysis and bathing/hygiene with BM in bed    Ambulation/Gait                   Stairs             Wheelchair Mobility    Modified Rankin (Stroke Patients Only)       Balance                                            Cognition Arousal/Alertness: Awake/alert Behavior During Therapy: Flat affect Overall Cognitive Status: History of cognitive impairments - at baseline                                 General Comments: some verbalizations in respose to questions but limited, sucking on her gown as PT entered and NT reports she has been doing this        Exercises      General Comments        Pertinent Vitals/Pain Pain Assessment Pain Assessment: Faces Faces Pain Scale: Hurts even more Pain Location: with movement of fingers on L hand and with moving legs into abduction Pain Descriptors / Indicators: Discomfort, Moaning Pain Intervention(s): Monitored during session, Repositioned    Home Living Family/patient expects to be discharged to:: Private residence Living  Arrangements: Children Available Help at Discharge: Family (aide 6 days a week 2 hours a day) Type of Home: House Home Access: Ramped entrance       Home Layout: One level Home Equipment: BSC/3in1;Other (comment);Hospital bed;Tub bench;Grab bars - tub/shower;Hand held shower head;Grab bars - toilet;Cane - single point Additional Comments: history from son [name] on phone    Prior Function            PT Goals (current goals can now be found in the care plan section) Acute Rehab PT Goals Patient Stated Goal: to return home PT Goal Formulation: With patient/family Time For Goal Achievement: 06/17/23 Potential to Achieve Goals: Good    Frequency    Min 3X/week      PT Plan      Co-evaluation              AM-PAC PT "6 Clicks" Mobility    Outcome Measure  Help needed turning from your back to your side while in a flat bed without using bedrails?: Total Help needed moving from lying on your back to sitting on the side of a flat bed without using bedrails?: Total Help needed moving to and from a bed to a chair (including a wheelchair)?: Total Help needed standing up from a chair using your arms (e.g., wheelchair or bedside chair)?: Total Help needed to walk in hospital room?: Total Help needed climbing 3-5 steps with a railing? : Total 6 Click Score: 6    End of Session   Activity Tolerance: Patient limited by fatigue Patient left: in bed;with call bell/phone within reach;with bed alarm set   PT Visit Diagnosis: Other abnormalities of gait and mobility (R26.89);Muscle weakness (generalized) (M62.81)     Time: 1610-9604 PT Time Calculation (min) (ACUTE ONLY): 61 min  Charges:  $Therapeutic Activity: 23-37 mins                     Sheran Lawless, PT Acute Rehabilitation Services Office:[phone] 06/03/2023    Kirsten Mcmahon 06/03/2023, 6:08 PM

## 2023-06-03 NOTE — Progress Notes (Signed)
POST HD TX NOTE  06/03/23 1240  Vitals  Temp 98 F (36.7 C)  Temp Source Oral  BP 115/70  MAP (mmHg) 84  BP Location Left Wrist  BP Method Automatic  Patient Position (if appropriate) Lying  Pulse Rate 85  Pulse Rate Source Monitor  ECG Heart Rate 85  Resp (!) 6  Oxygen Therapy  SpO2 99 %  O2 Device Room Air  Pulse Oximetry Type Continuous  During Treatment Monitoring  Intra-Hemodialysis Comments (S)   (post HD tx VS check)  Post Treatment  Dialyzer Clearance Lightly streaked  Duration of HD Treatment -hour(s) 3.25 hour(s) (3 hr 16 min)  Hemodialysis Intake (mL) 0 mL  Liters Processed 77  Fluid Removed (mL) 1700 mL  Tolerated HD Treatment Yes  Post-Hemodialysis Comments (S)  tx ended 14 min early d/t access issues or system clotting off. frequent alarms d/t pt kept moving her arm, however it got to where it was frequently alarming when she wasn't moving her arm, BFR was decreased 2x, and this intervention didn't work. was afraid system was clotting so ended tx so as not to lose blood. UF goalnot met, blood rinsed back. VSS. Medication Admin: Heparin 2000 units bolus  Fistula / Graft Right Upper arm Arteriovenous vein graft  Placement Date/Time: 09/29/21 1023   Placed prior to admission: No  Orientation: Right  Access Location: Upper arm  Access Type: Arteriovenous vein graft  Site Condition No complications  Fistula / Graft Assessment Bruit;Thrill;Present  Drainage Description None

## 2023-06-04 DIAGNOSIS — G40909 Epilepsy, unspecified, not intractable, without status epilepticus: Secondary | ICD-10-CM | POA: Diagnosis not present

## 2023-06-04 LAB — GLUCOSE, CAPILLARY
Glucose-Capillary: 102 mg/dL — ABNORMAL HIGH (ref 70–99)
Glucose-Capillary: 158 mg/dL — ABNORMAL HIGH (ref 70–99)
Glucose-Capillary: 170 mg/dL — ABNORMAL HIGH (ref 70–99)
Glucose-Capillary: 89 mg/dL (ref 70–99)
Glucose-Capillary: 94 mg/dL (ref 70–99)

## 2023-06-04 LAB — CBC
HCT: 30.6 % — ABNORMAL LOW (ref 36.0–46.0)
Hemoglobin: 9.9 g/dL — ABNORMAL LOW (ref 12.0–15.0)
MCH: 33.1 pg (ref 26.0–34.0)
MCHC: 32.4 g/dL (ref 30.0–36.0)
MCV: 102.3 fL — ABNORMAL HIGH (ref 80.0–100.0)
Platelets: 209 10*3/uL (ref 150–400)
RBC: 2.99 MIL/uL — ABNORMAL LOW (ref 3.87–5.11)
RDW: 13.6 % (ref 11.5–15.5)
WBC: 9 10*3/uL (ref 4.0–10.5)
nRBC: 0 % (ref 0.0–0.2)

## 2023-06-04 LAB — MAGNESIUM: Magnesium: 1.6 mg/dL — ABNORMAL LOW (ref 1.7–2.4)

## 2023-06-04 LAB — RENAL FUNCTION PANEL
Albumin: 2.8 g/dL — ABNORMAL LOW (ref 3.5–5.0)
Anion gap: 9 (ref 5–15)
BUN: 15 mg/dL (ref 8–23)
CO2: 28 mmol/L (ref 22–32)
Calcium: 8.4 mg/dL — ABNORMAL LOW (ref 8.9–10.3)
Chloride: 94 mmol/L — ABNORMAL LOW (ref 98–111)
Creatinine, Ser: 4.47 mg/dL — ABNORMAL HIGH (ref 0.44–1.00)
GFR, Estimated: 10 mL/min — ABNORMAL LOW (ref >60)
Glucose, Bld: 88 mg/dL (ref 70–99)
Phosphorus: 3 mg/dL (ref 2.5–4.6)
Potassium: 4.2 mmol/L (ref 3.5–5.1)
Sodium: 131 mmol/L — ABNORMAL LOW (ref 135–145)

## 2023-06-04 MED ORDER — VALTOCO 15 MG DOSE 7.5 MG/0.1ML NA LQPK: 15.0000 mg | Freq: Once | NASAL | 0 refills | Status: DC | PRN

## 2023-06-04 MED ORDER — METOPROLOL SUCCINATE ER 25 MG PO TB24: 25.0000 mg | ORAL_TABLET | Freq: Every day | ORAL | 0 refills | Status: DC

## 2023-06-04 MED ORDER — CHLORHEXIDINE GLUCONATE 0.12% ORAL RINSE (MEDLINE KIT): 15.0000 mL | Freq: Two times a day (BID) | OROMUCOSAL | 0 refills | Status: DC

## 2023-06-04 MED ORDER — LACOSAMIDE 150 MG PO TABS: 150.0000 mg | ORAL_TABLET | Freq: Two times a day (BID) | ORAL | 0 refills | Status: DC

## 2023-06-04 MED ORDER — MAGNESIUM SULFATE 2 GM/50ML IV SOLN
2.0000 g | Freq: Once | INTRAVENOUS | Status: AC
  Administered 2023-06-04: 2 g via INTRAVENOUS
  Filled 2023-06-04: qty 50

## 2023-06-04 NOTE — Progress Notes (Signed)
OT Cancellation Note  Patient Details Name: RHYANNE KRINGS MRN: [id number] DOB: 12/21/1945   Cancelled Treatment:    Reason Eval/Treat Not Completed: Other (comment). Pt has assistance for ADL and participates in the PACE program. Will defer OT needs to PACE. OT signing off.   Malic Rosten,HILLARY 06/04/2023, 12:00 PM Luisa Dago, OT/L   Acute OT Clinical Specialist Acute Rehabilitation Services Pager [phone] Office [phone]

## 2023-06-04 NOTE — Plan of Care (Signed)
  Problem: Education: Goal: Knowledge of General Education information will improve Description Including pain rating scale, medication(s)/side effects and non-pharmacologic comfort measures Outcome: Progressing   Problem: Health Behavior/Discharge Planning: Goal: Ability to manage health-related needs will improve Outcome: Progressing   

## 2023-06-04 NOTE — Progress Notes (Signed)
[facility] Progress Note   Subjective: Seen in room. Being discharged today. Opens eyes, makes eye contact but not speaking.     Objective Vitals:   06/03/23 2006 06/04/23 0004 06/04/23 0349 06/04/23 0803  BP: 126/73 120/74 113/67 134/73  Pulse: 96 93 88 78  Resp: 17 16 16 16   Temp: 98.4 F (36.9 C) 98.7 F (37.1 C) 98.2 F (36.8 C) 98.1 F (36.7 C)  TempSrc: Oral Oral Oral Oral  SpO2: 98% 100% 100% 100%  Weight:      Height:       Physical Exam General: Chronically ill appearing very elderly female in NAD Heart: S1,S2 RRR No M/R/G Lungs: CTAB Abdomen: Soft No LE edema Dialysis Access: R AVG +T/B   Additional Objective Labs: Basic Metabolic Panel: Recent Labs  Lab 06/02/23 0704 06/03/23 0654 06/04/23 0358  NA 136 133* 131*  K 3.4* 4.0 4.2  CL 96* 92* 94*  CO2 28 28 28   GLUCOSE 81 100* 88  BUN 16 29* 15  CREATININE 5.40* 7.15* 4.47*  CALCIUM 8.3* 8.2* 8.4*  PHOS 3.6 4.5 3.0   Liver Function Tests: Recent Labs  Lab 06/02/23 0704 06/03/23 0654 06/04/23 0358  ALBUMIN 3.0* 2.9* 2.8*   No results for input(s): "LIPASE", "AMYLASE" in the last 168 hours. CBC: Recent Labs  Lab 05/30/23 1524 05/31/23 0758 06/01/23 0428 06/02/23 0704 06/03/23 0654 06/04/23 0358  WBC 11.1* 7.9 8.8 7.0 7.4 9.0  NEUTROABS 9.0*  --   --   --   --   --   HGB 11.5* 11.1* 11.1* 10.8* 10.2* 9.9*  HCT 36.3 34.8* 34.1* 33.7* 31.2* 30.6*  MCV 103.1* 101.5* 102.7* 101.8* 98.7 102.3*  PLT 217 207 180 193 207 209   Blood Culture    Component Value Date/Time   SDES BLOOD RIGHT ANTECUBITAL 07/25/2021 1642   SPECREQUEST  07/25/2021 1642    BOTTLES DRAWN AEROBIC AND ANAEROBIC Blood Culture adequate volume   CULT  07/25/2021 1642    NO GROWTH 5 DAYS Performed at [facility], [address] [place], [place] [address]    REPTSTATUS 07/30/2021 FINAL 07/25/2021 1642    Cardiac Enzymes: No results for input(s): "CKTOTAL", "CKMB", "CKMBINDEX", "TROPONINI" in  the last 168 hours. CBG: Recent Labs  Lab 06/03/23 1409 06/03/23 2011 06/04/23 0009 06/04/23 0357 06/04/23 0802  GLUCAP 107* 82 102* 89 94   Iron Studies: No results for input(s): "IRON", "TIBC", "TRANSFERRIN", "FERRITIN" in the last 72 hours. @lablastinr3 @ Studies/Results: No results found. Medications:   calcitRIOL  0.75 mcg Oral Q M,W,F-2000   chlorhexidine gluconate (MEDLINE KIT)  15 mL Mouth Rinse BID   Chlorhexidine Gluconate Cloth  6 each Topical Q0600   Chlorhexidine Gluconate Cloth  6 each Topical Q0600   lacosamide  150 mg Oral BID   mouth rinse  15 mL Mouth Rinse UD   sucroferric oxyhydroxide  500 mg Oral TID WC     OP HD: MWF [facility]  [address]    3h  350/ 1.5  59.3kg  2/2.5 bath  Heparin 2000  AVG   - pt missed 6/21 HD and 6/24 HD was aborted at 2 hrs due to seizure as above - otherwise her compliance has been good looking back the last 2 weeks - last OP HD 6/19, post wt 58.5kg   - rocaltrol 0.75 mcg po three times per week - venofer 50mg  weekly IV - mircera 75 mcg IV q 4 wks, last 5/29, due  6/26 (today)     Assessment/ Plan: Seizures - in pt w/ hx of seizures. Not sure she was taking Vimpat at home. Happened in middle of HD session. Had missed 1 session last week, otherwise had not missed in the prior 1.5 wks. Had HD here yesterday. Pt remains a bit disoriented.  ESRD - on HD MWF. Next HD 06/06/2023 at OP center.  HTN - bp's are soft to wnl. Holding home toprol xl.  Volume - no vol excess on exam. 2-3 kg over dry wt, UF goal 2 L today.  Anemia esrd - Hb in 11-12 range. Esa due today but given Hb > 10 cont to hold for now.   MBD ckd - Ca/P in range. Cont binders and po vdra when eating.  H/o CVA Hypothyroidism  Disposition: Discharge today. Follow up at OP clinic 06/06/2023.  Francis Doenges H. Willo Yoon NP-C 06/04/2023, 11:12 AM  [employer] [phone]

## 2023-06-04 NOTE — Discharge Summary (Signed)
Physician Discharge Summary   Patient: Kirsten Mcmahon MRN: [id number] DOB: [birth year]  Admit date:     05/31/2023  Discharge date: 06/04/23  Discharge Physician: Lynden Oxford  PCP: [facility]  Recommendations at discharge: Follow-up with PCP as well as neurology as recommended.   Follow-up Information     [facility] Schedule an appointment as soon as possible for a visit in 1 week(s).   Contact information: [address] [place] [address] [phone]         Van Clines, MD. Schedule an appointment as soon as possible for a visit in 1 month(s).   Specialty: Neurology Contact information: [address] [place] [address] [phone]                Discharge Diagnoses: Principal Problem:   Recurrent seizures (HCC) Active Problems:   End-stage renal disease on hemodialysis (HCC)   Hypothyroidism   Essential hypertension   History of breast cancer   History of CVA (cerebrovascular accident)  Brief hospital course: PMH of ESRD on HD MWF, GERD, hypothyroidism, CVA with left hemiplegia, HLD, seizures on Vimpat, HTN present to the hospital with complaints of seizures. Neurology, nephrology consulted. Was on LTM EEG monitoring. Assessment and Plan: Recurrent seizures Presents with multiple episodes of seizures during HD. Unable to complete HD secondary to the same. Neurology consulted. Was undergoing LTM EEG. On Vimpat 100 mg twice daily.  Dose increased 150 mg twice daily due to seizures. Mentation significantly better.  LTM EEG discontinued on 6/27. Speech was able to advance the diet to regular food. PT OT evaluation recommend rehab at pace.   End-stage renal disease on hemodialysis  HD MWF. Unable to complete her HD prior to admission.  Tolerated HD in the hospital without any seizures.   Hypothyroidism We will continue Synthroid.   Essential hypertension We will continue  Toprol-XL.  Dose of the Toprol-XL was adjusted as per blood pressure has remained stable without any medication in the hospital.   History of CVA (cerebrovascular accident) We will continue aspirin.   History of breast cancer We will continue Arimidex.  Hypoglycemia. Treated with D10.  Now improving.  Pain control - [employer] Controlled Substance Reporting System database was reviewed. and patient was instructed, not to drive, operate heavy machinery, perform activities at heights, swimming or participation in water activities or provide baby-sitting services while on Pain, Sleep and Anxiety Medications; until their outpatient Physician has advised to do so again. Also recommended to not to take more than prescribed Pain, Sleep and Anxiety Medications.  Consultants:  Neurology Nephrology Procedures performed:  LTM EEG monitoring HD  DISCHARGE MEDICATION: Allergies as of 06/04/2023       Reactions   Contrast Media [iodinated Contrast Media] Swelling   Latex Itching   Shellfish-derived Products Other (See Comments)   Other reaction(s): NO ALLERGY   Levemir [insulin Detemir] Itching        Medication List     STOP taking these medications    alum & mag hydroxide-simeth 400-400-40 MG/5ML suspension Commonly known as: MAALOX PLUS   amoxicillin-clavulanate 875-125 MG tablet Commonly known as: AUGMENTIN   azithromycin 250 MG tablet Commonly known as: ZITHROMAX   DIALYVITE 800 WITH ZINC 0.8 MG Tabs       TAKE these medications    (feeding supplement) PROSource Plus liquid Take 30 mLs by mouth 3 (three) times daily between meals. What changed: Another medication with the same  name was removed. Continue taking this medication, and follow the directions you see here.   acetaminophen 500 MG tablet Commonly known as: TYLENOL Take 1,000 mg by mouth daily.   anastrozole 1 MG tablet Commonly known as: ARIMIDEX Take 1 tablet (1 mg total) by mouth daily.   aspirin 81  MG chewable tablet Chew 1 tablet (81 mg total) by mouth daily.   calcitRIOL 0.5 MCG capsule Commonly known as: ROCALTROL Take 1 capsule (0.5 mcg total) by mouth every Monday, Wednesday, and Friday with hemodialysis.   chlorhexidine gluconate (MEDLINE KIT) 0.12 % solution Commonly known as: PERIDEX 15 mLs by Mouth Rinse route 2 (two) times daily.   dextrose 40 % Gel Commonly known as: SWEET CHEEKS Place 28 mLs inside cheek as needed (for suspected low sugars).   donepezil 10 MG tablet Commonly known as: ARICEPT Take 10 mg by mouth daily.   famotidine 10 MG tablet Commonly known as: PEPCID Take 10 mg by mouth daily.   Lacosamide 150 MG Tabs Take 1 tablet (150 mg total) by mouth 2 (two) times daily. What changed:  medication strength how much to take   latanoprost 0.005 % ophthalmic solution Commonly known as: XALATAN Place 1 drop into both eyes at bedtime.   levothyroxine 112 MCG tablet Commonly known as: SYNTHROID Take 1 tablet (112 mcg total) by mouth daily before breakfast.   lidocaine-prilocaine cream Commonly known as: EMLA Apply 1 application. topically 3 (three) times a week.   metoprolol succinate 25 MG 24 hr tablet Commonly known as: TOPROL-XL Take 1 tablet (25 mg total) by mouth daily. What changed:  medication strength how much to take   multivitamin Tabs tablet Take 1 tablet by mouth at bedtime.   Valtoco 15 MG Dose 2 x 7.5 MG/0.1ML Lqpk Generic drug: diazePAM (15 MG Dose) Place 15 mg into the nose once as needed for up to 1 dose (for seizure lasting more than 2 minutes.).   Velphoro 500 MG chewable tablet Generic drug: sucroferric oxyhydroxide Chew 500 mg by mouth 3 (three) times daily with meals.       Disposition: Home Diet recommendation: Cardiac diet  Discharge Exam: Vitals:   06/03/23 2006 06/04/23 0004 06/04/23 0349 06/04/23 0803  BP: 126/73 120/74 113/67 134/73  Pulse: 96 93 88 78  Resp: 17 16 16 16   Temp: 98.4 F (36.9 C) 98.7  F (37.1 C) 98.2 F (36.8 C) 98.1 F (36.7 C)  TempSrc: Oral Oral Oral Oral  SpO2: 98% 100% 100% 100%  Weight:      Height:       General: Appear in mild distress; no visible Abnormal Neck Mass Or lumps, Conjunctiva normal Cardiovascular: S1 and S2 Present, no Murmur, Respiratory: good respiratory effort, Bilateral Air entry present and CTA, no Crackles, no wheezes Abdomen: Bowel Sound present, Non tender  Extremities: no Pedal edema Neurology: alert and oriented to place and person will respond, able to follow commands, oriented to herself, left-sided weakness chronic. Filed Weights   06/01/23 1528 06/03/23 0805 06/03/23 1240  Weight: 62.5 kg 62.9 kg 61.4 kg   Condition at discharge: stable  The results of significant diagnostics from this hospitalization (including imaging, microbiology, ancillary and laboratory) are listed below for reference.   Imaging Studies: Overnight EEG with video  Result Date: 06/01/2023 [name], MD     06/02/2023 10:46 AM Patient Name: Kirsten Mcmahon MRN: [id number] Epilepsy Attending: [name] Referring Physician/Provider: Caryl Pina, MD Duration: 05/31/2023 2038 to 06/02/2023 2038  Patient history: 77 year old female with a history of right MCA stroke and left hemiplegia admitted due to increasing frequency of seizures hindering dialysis sessions. EEG to evaluate for seizure. Level of alertness: Awake, sleep AEDs during EEG study: LCM Technical aspects: This EEG study was done with scalp electrodes positioned according to the 10-20 International system of electrode placement. Electrical activity was reviewed with band pass filter of 1-70Hz , sensitivity of 7 uV/mm, display speed of 10mm/sec with a 60Hz  notched filter applied as appropriate. EEG data were recorded continuously and digitally stored.  Video monitoring was available and reviewed as appropriate. Description: : The posterior dominant rhythm consists of 8Hz  activity of moderate  voltage (25-35 uV) seen predominantly in posterior head regions, symmetric and reactive to eye opening and eye closing. Sleep was characterized by vertex waves, sleep spindles (12-14 Hz, maximal frontocentral region. EEG showed continuous generalized and lateralized right hemisphere 3 to 6 Hz theta-delta slowing. There are also sharply contoured waves in the right centro-parietal region consistent with underlying breach artifact. Spikes were noted in right>left frontotemporal region. Physiologic photic driving was not seen during photic stimulation.  Hyperventilation was not performed.   ABNORMALITY -Spike, right>left frontotemporal region. -Breach artifact, right centro-parietal region -Continuous slow, generalized and lateralized right hemisphere  IMPRESSION: This study is consistent with patient's history of epilepsy arising from right>left frontotemporal region. Additionally there is evidence of cortical dysfunction in right centro-parietal region consistent with prior craniotomy and underlying stroke. Lastly there is also moderate degree of encephalopathy.  No seizures were seen throughout the recording.  [name]   CT HEAD WO CONTRAST ( )  Result Date: 05/30/2023 CLINICAL DATA:  New onset seizure.  Dialysis patient. EXAM: CT HEAD WITHOUT CONTRAST TECHNIQUE: Contiguous axial images were obtained from the base of the skull through the vertex without intravenous contrast. RADIATION DOSE REDUCTION: This exam was performed according to the departmental dose-optimization program which includes automated exposure control, adjustment of the mA and/or kV according to patient size and/or use of iterative reconstruction technique. COMPARISON:  l 05/25/2023 FINDINGS: Brain: Distant right frontal craniectomy. Old small vessel infarctions of the cerebellum. Old infarction in the right middle cerebral artery territory affecting the insular region and frontal operculum. Old infarction in the right occipital  lobe. No CT evidence of acute infarction, mass, hemorrhage, hydrocephalus or extra-axial collection. Vascular: There is atherosclerotic calcification of the major vessels at the base of the brain. Skull: Otherwise negative. Sinuses/Orbits: Clear/normal Other: None IMPRESSION: No acute finding by CT. Old infarctions in the right middle cerebral artery territory, right occipital lobe and cerebellum. Distant right frontal craniectomy. Electronically Signed   By: Paulina Fusi M.D.   On: 05/30/2023 16:30   DG Chest Portable 1 View  Result Date: 05/30/2023 CLINICAL DATA:  Weakness, edema EXAM: PORTABLE CHEST 1 VIEW COMPARISON:  12/27/2022 FINDINGS: Single frontal view of the chest demonstrates stable enlargement of the cardiac silhouette and ectasia of the thoracic aorta. There is increased central vascular congestion with mild bilateral infrahilar airspace disease. Trace left effusion. No pneumothorax. IMPRESSION: 1. Findings consistent with mild congestive heart failure. Electronically Signed   By: Sharlet Salina M.D.   On: 05/30/2023 15:59   CT HEAD CODE STROKE WO CONTRAST  Result Date: 05/25/2023 CLINICAL DATA:  Code stroke. Neuro deficit, acute, stroke suspected. Right-sided gaze. EXAM: CT HEAD WITHOUT CONTRAST TECHNIQUE: Contiguous axial images were obtained from the base of the skull through the vertex without intravenous contrast. RADIATION DOSE REDUCTION: This exam was performed  according to the departmental dose-optimization program which includes automated exposure control, adjustment of the mA and/or kV according to patient size and/or use of iterative reconstruction technique. COMPARISON:  CT Head 03/08/22 FINDINGS: Brain: Sequela of prior decompressive right-sided hemicraniectomy with extensive encephalomalacia in the right MCA territory involving the right frontal lobe in the right insular region. There is also a chronic right PCA territory infarct involving the right thalamus in the right occipital  lobe. Additional infarct is also noted involving the bilateral cerebellar hemispheres. No hemorrhage. No definite CT evidence of a new cortical infarct. No hydrocephalus. No extra-axial fluid collection. Vascular: No hyperdense vessel or unexpected calcification. Skull: Normal. Negative for fracture or focal lesion. Sinuses/Orbits: Middle ear or mastoid effusion. Paranasal sinuses are clear. Orbits are unremarkable. Other: None. ASPECTS [facility] Stroke Program Early CT Score): 10 when accounting for chronic findings. IMPRESSION: 1. No hemorrhage or CT evidence of an acute cortical infarct. Aspects is 10 when accounting for chronic findings 2. Sequela of moderate chronic microvascular ischemic change with multiple chronic infarcts involving the right MCA territory right PCA territory, in the bilateral cerebellar hemispheres Findings were paged to Dr. Iver Nestle on 05/25/23 at 5:22 PM vai AMION paging system. Electronically Signed   By: Lorenza Cambridge M.D.   On: 05/25/2023 17:23    Microbiology: Results for orders placed or performed during the hospital encounter of 05/31/23  MRSA Next Gen by PCR, Nasal     Status: None   Collection Time: 06/01/23 12:53 AM   Specimen: Nasal Mucosa; Nasal Swab  Result Value Ref Range Status   MRSA by PCR Next Gen NOT DETECTED NOT DETECTED Final    Comment: (NOTE) The GeneXpert MRSA Assay (FDA approved for NASAL specimens only), is one component of a comprehensive MRSA colonization surveillance program. It is not intended to diagnose MRSA infection nor to guide or monitor treatment for MRSA infections. Test performance is not FDA approved in patients less than 60 years old. Performed at [facility], [address] [place], [place] [address]    Labs: CBC: Recent Labs  Lab 05/30/23 1524 05/31/23 0758 06/01/23 0428 06/02/23 0704 06/03/23 0654 06/04/23 0358  WBC 11.1* 7.9 8.8 7.0 7.4 9.0  NEUTROABS 9.0*  --   --   --   --   --   HGB 11.5* 11.1* 11.1* 10.8*  10.2* 9.9*  HCT 36.3 34.8* 34.1* 33.7* 31.2* 30.6*  MCV 103.1* 101.5* 102.7* 101.8* 98.7 102.3*  PLT 217 207 180 193 207 209   Basic Metabolic Panel: Recent Labs  Lab 05/31/23 0758 05/31/23 2028 06/01/23 0428 06/02/23 0704 06/03/23 0654 06/04/23 0358  NA 139  --  139 136 133* 131*  K 3.2*  --  3.6 3.4* 4.0 4.2  CL 96*  --  96* 96* 92* 94*  CO2 28  --  25 28 28 28   GLUCOSE 98  --  101* 81 100* 88  BUN 46*  --  52* 16 29* 15  CREATININE 9.41*  --  10.93* 5.40* 7.15* 4.47*  CALCIUM 8.3*  --  8.5* 8.3* 8.2* 8.4*  MG  --  2.4  --  1.7 1.7 1.6*  PHOS 5.8*  --   --  3.6 4.5 3.0   Liver Function Tests: Recent Labs  Lab 05/31/23 0758 06/02/23 0704 06/03/23 0654 06/04/23 0358  ALBUMIN 3.5 3.0* 2.9* 2.8*   CBG: Recent Labs  Lab 06/03/23 1409 06/03/23 2011 06/04/23 0009 06/04/23 0357 06/04/23 0802  GLUCAP 107* 82 102*  89 94    Discharge time spent: greater than 30 minutes.  Author: Lynden Oxford, MD  [facility]

## 2023-06-04 NOTE — Plan of Care (Signed)
  Problem: Clinical Measurements: Goal: Will remain free from infection Outcome: Progressing Goal: Respiratory complications will improve Outcome: Progressing   Problem: Activity: Goal: Risk for activity intolerance will decrease Outcome: Progressing   Problem: Coping: Goal: Level of anxiety will decrease Outcome: Progressing   Problem: Pain Managment: Goal: General experience of comfort will improve Outcome: Progressing

## 2023-06-05 ENCOUNTER — Inpatient Hospital Stay (HOSPITAL_COMMUNITY): Payer: Medicare (Managed Care)

## 2023-06-05 DIAGNOSIS — G40909 Epilepsy, unspecified, not intractable, without status epilepticus: Secondary | ICD-10-CM | POA: Diagnosis not present

## 2023-06-05 LAB — GLUCOSE, CAPILLARY: Glucose-Capillary: 173 mg/dL — ABNORMAL HIGH (ref 70–99)

## 2023-06-05 LAB — HEPATITIS B SURFACE ANTIGEN: Hepatitis B Surface Ag: NONREACTIVE

## 2023-06-05 MED ORDER — LATANOPROST 0.005 % OP SOLN
1.0000 [drp] | Freq: Every day | OPHTHALMIC | Status: DC
  Administered 2023-06-05: 1 [drp] via OPHTHALMIC
  Filled 2023-06-05: qty 2.5

## 2023-06-05 MED ORDER — FAMOTIDINE 20 MG PO TABS
10.0000 mg | ORAL_TABLET | Freq: Every day | ORAL | Status: DC
  Administered 2023-06-05 – 2023-06-06 (×2): 10 mg via ORAL
  Filled 2023-06-05 (×2): qty 1

## 2023-06-05 MED ORDER — LEVOTHYROXINE SODIUM 112 MCG PO TABS
112.0000 ug | ORAL_TABLET | Freq: Every day | ORAL | Status: DC
  Administered 2023-06-06: 112 ug via ORAL
  Filled 2023-06-05 (×2): qty 1

## 2023-06-05 MED ORDER — LEVOTHYROXINE SODIUM 100 MCG/5ML IV SOLN
66.0000 ug | Freq: Once | INTRAVENOUS | Status: AC
  Administered 2023-06-05: 66 ug via INTRAVENOUS
  Filled 2023-06-05: qty 5

## 2023-06-05 MED ORDER — ANASTROZOLE 1 MG PO TABS
1.0000 mg | ORAL_TABLET | Freq: Every day | ORAL | Status: DC
  Administered 2023-06-05 – 2023-06-06 (×2): 1 mg via ORAL
  Filled 2023-06-05 (×2): qty 1

## 2023-06-05 MED ORDER — ASPIRIN 81 MG PO CHEW
81.0000 mg | CHEWABLE_TABLET | Freq: Every day | ORAL | Status: DC
  Administered 2023-06-05 – 2023-06-06 (×2): 81 mg via ORAL
  Filled 2023-06-05 (×2): qty 1

## 2023-06-05 MED ORDER — SENNOSIDES-DOCUSATE SODIUM 8.6-50 MG PO TABS
2.0000 | ORAL_TABLET | Freq: Two times a day (BID) | ORAL | Status: DC
  Administered 2023-06-05 – 2023-06-06 (×2): 2 via ORAL
  Filled 2023-06-05 (×2): qty 2

## 2023-06-05 MED ORDER — DONEPEZIL HCL 10 MG PO TABS
10.0000 mg | ORAL_TABLET | Freq: Every day | ORAL | Status: DC
  Administered 2023-06-05 – 2023-06-06 (×2): 10 mg via ORAL
  Filled 2023-06-05 (×2): qty 1

## 2023-06-05 MED ORDER — METOCLOPRAMIDE HCL 5 MG/ML IJ SOLN
5.0000 mg | Freq: Four times a day (QID) | INTRAMUSCULAR | Status: AC
  Administered 2023-06-05 (×2): 5 mg via INTRAVENOUS
  Filled 2023-06-05 (×3): qty 2

## 2023-06-05 NOTE — Progress Notes (Addendum)
[facility] Progress Note   Subjective: Discharge order canceled D/T son going OOT per report from primary. Being cleaned and changed at present. Not really talking does yell when turned.   Objective Vitals:   06/04/23 2319 06/05/23 0326 06/05/23 0906 06/05/23 1104  BP: 132/71 (!) 143/84 131/68 (!) 143/89  Pulse: 79 81 79 79  Resp: 16 16 20 17   Temp: 98.6 F (37 C) 98.2 F (36.8 C) 98 F (36.7 C) 99.3 F (37.4 C)  TempSrc: Oral Oral Oral Axillary  SpO2: 100% 100% 100% 100%  Weight:      Height:       Physical Exam General: Chronically ill appearing very elderly female in NAD Heart: S1,S2 RRR No M/R/G Lungs: CTAB Abdomen: Soft NABS  Lower  Extremities: No LE edema Dialysis Access: R AVG +T/B   Additional Objective Labs: Basic Metabolic Panel: Recent Labs  Lab 06/02/23 0704 06/03/23 0654 06/04/23 0358  NA 136 133* 131*  K 3.4* 4.0 4.2  CL 96* 92* 94*  CO2 28 28 28   GLUCOSE 81 100* 88  BUN 16 29* 15  CREATININE 5.40* 7.15* 4.47*  CALCIUM 8.3* 8.2* 8.4*  PHOS 3.6 4.5 3.0   Liver Function Tests: Recent Labs  Lab 06/02/23 0704 06/03/23 0654 06/04/23 0358  ALBUMIN 3.0* 2.9* 2.8*   No results for input(s): "LIPASE", "AMYLASE" in the last 168 hours. CBC: Recent Labs  Lab 05/30/23 1524 05/31/23 0758 06/01/23 0428 06/02/23 0704 06/03/23 0654 06/04/23 0358  WBC 11.1* 7.9 8.8 7.0 7.4 9.0  NEUTROABS 9.0*  --   --   --   --   --   HGB 11.5* 11.1* 11.1* 10.8* 10.2* 9.9*  HCT 36.3 34.8* 34.1* 33.7* 31.2* 30.6*  MCV 103.1* 101.5* 102.7* 101.8* 98.7 102.3*  PLT 217 207 180 193 207 209   Blood Culture    Component Value Date/Time   SDES BLOOD RIGHT ANTECUBITAL 07/25/2021 1642   SPECREQUEST  07/25/2021 1642    BOTTLES DRAWN AEROBIC AND ANAEROBIC Blood Culture adequate volume   CULT  07/25/2021 1642    NO GROWTH 5 DAYS Performed at [facility], [address], [place], [place] [address]    REPTSTATUS 07/30/2021 FINAL 07/25/2021 1642     Cardiac Enzymes: No results for input(s): "CKTOTAL", "CKMB", "CKMBINDEX", "TROPONINI" in the last 168 hours. CBG: Recent Labs  Lab 06/04/23 0009 06/04/23 0357 06/04/23 0802 06/04/23 1218 06/04/23 1953  GLUCAP 102* 89 94 158* 170*   Iron Studies: No results for input(s): "IRON", "TIBC", "TRANSFERRIN", "FERRITIN" in the last 72 hours. @lablastinr3 @ Studies/Results: No results found. Medications:   anastrozole  1 mg Oral Daily   aspirin  81 mg Oral Daily   calcitRIOL  0.75 mcg Oral Q M,W,F-2000   Chlorhexidine Gluconate Cloth  6 each Topical Q0600   Chlorhexidine Gluconate Cloth  6 each Topical Q0600   donepezil  10 mg Oral Daily   famotidine  10 mg Oral Daily   lacosamide  150 mg Oral BID   latanoprost  1 drop Both Eyes QHS   levothyroxine  112 mcg Oral QAC breakfast   levothyroxine  66 mcg Intravenous Once   mouth rinse  15 mL Mouth Rinse UD   sucroferric oxyhydroxide  500 mg Oral TID WC     OP HD: MWF [facility]  [address]    3h  350/ 1.5  59.3kg  2/2.5 bath AVG  - Heparin 2000 units IV TIW  - pt missed  6/21 HD and 6/24 HD was aborted at 2 hrs due to seizure as above - otherwise her compliance has been good looking back the last 2 weeks - last OP HD 6/19, post wt 58.5kg   - rocaltrol 0.75 mcg po three times per week - venofer 50mg  weekly IV - mircera 75 mcg IV q 4 wks, last 5/29, due 6/26 (today)     Assessment/ Plan: Seizures - in pt w/ hx of seizures. Not sure she was taking Vimpat at home. Happened in middle of HD session. Had missed 1 session last week, otherwise had not missed in the prior 1.5 wks. Had HD here yesterday. Pt remains a bit disoriented.  ESRD - on HD MWF. Next HD 06/06/2023 HTN - BP controlled last 24 hours. Holding BB.  Volume - no vol excess on exam. UF as tolerated.  Anemia esrd HGB 9.9. ESA held D/T HGB > 10.0 was due 06/01/2023. Will give low dose Aranesp with HD tomorrow.  Follow HGB MBD ckd - Ca/P in range. Cont binders and  po vdra when eating.  H/o CVA Hypothyroidism  Cylis Ayars H. Khaleed Holan NP-C 06/05/2023, 11:19 AM  [employer] [phone]

## 2023-06-05 NOTE — Significant Event (Signed)
Reported by NT Misty Stanley that patient vomited. RN was just at bedside giving patient a bath with the NT. Patient does not endorse nausea when asked. Zofran given.

## 2023-06-05 NOTE — Progress Notes (Signed)
[facility] Progress Note Patient: Kirsten Mcmahon WJX:[id number] DOB: 05/06/1946 DOA: 05/31/2023  DOS: the patient was seen and examined on 06/05/2023  Brief hospital course: PMH of ESRD on HD MWF, GERD, hypothyroidism, CVA with left hemiplegia, HLD, seizures on Vimpat, HTN present to the hospital with complaints of seizures. Neurology, nephrology consulted. Was on LTM EEG monitoring. Assessment and Plan: Recurrent seizures Presents with multiple episodes of seizures during HD. Unable to complete HD secondary to the same. Neurology consulted. Was undergoing LTM EEG. IV Ativan as needed. On Vimpat.  Dose increased due to her encephalopathy. Mentation significantly better.  LTM EEG discontinued on 6/27. Speech was able to advance the diet to regular food. PT OT evaluation recommend rehab at pace.   End-stage renal disease on hemodialysis  HD MWF. Unable to complete her HD prior to admission.  Tolerated HD in the hospital without any seizures. Discussed with nephrology and requested HD for Monday.   Hypothyroidism We will continue Synthroid.  Will give IV Synthroid for today. Switch to p.o.  Tomorrow.  Intractable nausea and vomiting. Constipation. Had 2 episodes of vomiting so far. X-ray shows mild constipation. Will provide bowel regimen. Actually had 2 BM so far today. Will also place on scheduled Reglan.   Essential hypertension We will continue Toprol-XL.   History of CVA (cerebrovascular accident) We will continue aspirin.   History of breast cancer We will continue Arimidex.  Hypoglycemia. Treated with D10.  Now improving.  Will stop and monitor.   Subjective: More sleepy today.  Able to answer questions but slow to response compared to yesterday.  No nausea or vomiting at the time of admission but later on high doses of the vomiting.  Physical Exam: In no distress. Clear to auscultation. Bowel sound present.  Nontender. No edema.  Data Reviewed: I  have Reviewed nursing notes, Vitals, and Lab results. Reviewed BC and BMP.  Reordered CBC and BMP.  Discussed with nephrology.  Disposition: Status is: Inpatient Remains inpatient appropriate because: Now awaiting improvement in vomiting.  Place and maintain sequential compression device Start: 06/01/23 0841   Family Communication: Discussed with son on the phone on 6/28. Level of care: Telemetry Cardiac   Vitals:   06/05/23 0326 06/05/23 0906 06/05/23 1104 06/05/23 1552  BP: (!) 143/84 131/68 (!) 143/89 (!) 144/74  Pulse: 81 79 79 67  Resp: 16 20 17 18   Temp: 98.2 F (36.8 C) 98 F (36.7 C) 99.3 F (37.4 C) 98.7 F (37.1 C)  TempSrc: Oral Oral Axillary Oral  SpO2: 100% 100% 100% 99%  Weight:      Height:         Author: Lynden Oxford, MD 06/05/2023 6:00 PM  Please look on [email or web address] to find out who is on call.

## 2023-06-06 DIAGNOSIS — G40909 Epilepsy, unspecified, not intractable, without status epilepticus: Secondary | ICD-10-CM | POA: Diagnosis not present

## 2023-06-06 LAB — RENAL FUNCTION PANEL
Albumin: 3 g/dL — ABNORMAL LOW (ref 3.5–5.0)
Anion gap: 12 (ref 5–15)
BUN: 37 mg/dL — ABNORMAL HIGH (ref 8–23)
CO2: 26 mmol/L (ref 22–32)
Calcium: 9.1 mg/dL (ref 8.9–10.3)
Chloride: 90 mmol/L — ABNORMAL LOW (ref 98–111)
Creatinine, Ser: 8.23 mg/dL — ABNORMAL HIGH (ref 0.44–1.00)
GFR, Estimated: 5 mL/min — ABNORMAL LOW (ref >60)
Glucose, Bld: 95 mg/dL (ref 70–99)
Phosphorus: 4.8 mg/dL — ABNORMAL HIGH (ref 2.5–4.6)
Potassium: 5.1 mmol/L (ref 3.5–5.1)
Sodium: 128 mmol/L — ABNORMAL LOW (ref 135–145)

## 2023-06-06 LAB — CBC
HCT: 29.8 % — ABNORMAL LOW (ref 36.0–46.0)
Hemoglobin: 9.7 g/dL — ABNORMAL LOW (ref 12.0–15.0)
MCH: 32.1 pg (ref 26.0–34.0)
MCHC: 32.6 g/dL (ref 30.0–36.0)
MCV: 98.7 fL (ref 80.0–100.0)
Platelets: 251 10*3/uL (ref 150–400)
RBC: 3.02 MIL/uL — ABNORMAL LOW (ref 3.87–5.11)
RDW: 13.6 % (ref 11.5–15.5)
WBC: 8.2 10*3/uL (ref 4.0–10.5)
nRBC: 0 % (ref 0.0–0.2)

## 2023-06-06 LAB — GLUCOSE, CAPILLARY
Glucose-Capillary: 181 mg/dL — ABNORMAL HIGH (ref 70–99)
Glucose-Capillary: 95 mg/dL (ref 70–99)
Glucose-Capillary: 102 mg/dL — ABNORMAL HIGH (ref 70–99)
Glucose-Capillary: 119 mg/dL — ABNORMAL HIGH (ref 70–99)
Glucose-Capillary: 93 mg/dL (ref 70–99)
Glucose-Capillary: 78 mg/dL (ref 70–99)
Glucose-Capillary: 105 mg/dL — ABNORMAL HIGH (ref 70–99)

## 2023-06-06 MED ORDER — BISACODYL 5 MG PO TBEC: 5.0000 mg | DELAYED_RELEASE_TABLET | Freq: Every day | ORAL | 0 refills | Status: DC | PRN

## 2023-06-06 MED ORDER — DARBEPOETIN ALFA 60 MCG/0.3ML IJ SOSY
60.0000 ug | PREFILLED_SYRINGE | INTRAMUSCULAR | Status: DC
  Administered 2023-06-06: 60 ug via SUBCUTANEOUS
  Filled 2023-06-06: qty 0.3

## 2023-06-06 MED ORDER — HEPARIN SODIUM (PORCINE) 1000 UNIT/ML IJ SOLN
INTRAMUSCULAR | Status: AC
  Administered 2023-06-06: 2000 [IU]
  Filled 2023-06-06: qty 2

## 2023-06-06 MED ORDER — DOCUSATE SODIUM 100 MG PO CAPS: 100.0000 mg | ORAL_CAPSULE | Freq: Two times a day (BID) | ORAL | 0 refills | Status: DC

## 2023-06-06 NOTE — Significant Event (Signed)
Patient has returned to room from HD; worked with PT afterwards. Patient is confused; more talkative now than before since I've taken care of her. Patient is currently agitated and is paranoid that there are people hiding in her bathroom. She does not know where she is at; says she is atop of a roof on a bed. RN called patient's son and also had him speak with patient. Per patient's son, episodes like this would happened every now and then. Son wants patient home for familiarity with family and environment and that he will be around this evening to pick patient up for discharge to home. Gave patient her morning medications that was missed since she was at HD. Dr. Allena Katz at bedside.

## 2023-06-06 NOTE — Significant Event (Signed)
Pending discharge; waiting on family to arrive. Nursing staff spoke with son who says he will be here around 5:30pm.    Update at 1745pm: patient's son called back; says he will arrive before 6:30.  

## 2023-06-06 NOTE — Progress Notes (Signed)
This RN called pt's son [name]) regarding pt's pick up after discharge, left a voicemail to call us back ASAP. Obasogie-Asidi, Kerriann Kamphuis Efe

## 2023-06-06 NOTE — TOC Transition Note (Signed)
Transition of Care [facility]) - CM/SW Discharge Note   Patient Details  Name: Kirsten Mcmahon MRN: [id number] Date of Birth: July 15, 1946  [facility] CM/SW Contact:  Kermit Balo, RN Phone Number: 06/06/2023, 4:22 PM   Clinical Narrative:     Son feels pt will do better mentally at home. MD in agreement. Pt is discharging home with [facility] follow up. CM has updated [facility] on pts discharge.  Son to provide transport home.  Final next level of care: Home w Home Health Services Barriers to Discharge: No Barriers Identified   Patient Goals and CMS Choice      Discharge Placement                         Discharge Plan and Services Additional resources added to the After Visit Summary for     Discharge Planning Services: CM Consult                                 Social Determinants of Health (SDOH) Interventions SDOH Screenings   Food Insecurity: No Food Insecurity (05/31/2023)  Housing: Patient Unable To Answer (06/01/2023)  Transportation Needs: No Transportation Needs (05/31/2023)  Utilities: Not At Risk (05/31/2023)  Tobacco Use: Low Risk  (06/01/2023)     Readmission Risk Interventions    05/20/2021    2:15 PM 05/16/2021   11:27 AM 04/11/2021   10:53 AM  Readmission Risk Prevention Plan  Transportation Screening Complete Complete Complete  PCP or Specialist Appt within 3-5 Days   Complete  HRI or Home Care Consult   Complete  Social Work Consult for Recovery Care Planning/Counseling   Complete  Palliative Care Screening   Not Applicable  Medication Review Oceanographer) Complete Complete Complete  PCP or Specialist appointment within 3-5 days of discharge Complete Complete   HRI or Home Care Consult Complete Complete   SW Recovery Care/Counseling Consult  Complete   Palliative Care Screening Complete Complete   Skilled Nursing Facility Patient Refused Patient Refused

## 2023-06-06 NOTE — Discharge Summary (Addendum)
Physician Discharge Summary   Patient: Kirsten Mcmahon MRN: [id number] DOB: 12/15/1945  Admit date:     05/31/2023  Discharge date: 06/06/23  Discharge Physician: Lynden Oxford  PCP: [facility]  Recommendations at discharge: Follow-up with PCP as well as neurology as recommended.   Follow-up Information     [facility] Schedule an appointment as soon as possible for a visit in 1 week(s).   Contact information: [address] [place] [address] [phone]         Van Clines, MD. Schedule an appointment as soon as possible for a visit in 1 month(s).   Specialty: Neurology Contact information: [address] [place] [address] [phone]                Discharge Diagnoses: Principal Problem:   Recurrent seizures (HCC) Active Problems:   End-stage renal disease on hemodialysis (HCC)   Hypothyroidism   Essential hypertension   History of breast cancer   History of CVA (cerebrovascular accident)  Brief hospital course: PMH of ESRD on HD MWF, GERD, hypothyroidism, CVA with left hemiplegia, HLD, seizures on Vimpat, HTN present to the hospital with complaints of seizures. Neurology, nephrology consulted. Was on LTM EEG monitoring. Assessment and Plan: Recurrent seizures Presents with multiple episodes of seizures during HD. Unable to complete HD secondary to the same. Neurology consulted. Was undergoing LTM EEG. IV Ativan as needed. On Vimpat.  Dose increased due to her encephalopathy. Mentation significantly better.  LTM EEG discontinued on 6/27. Speech was able to advance the diet to regular food. PT OT evaluation recommend rehab at pace.   End-stage renal disease on hemodialysis  HD MWF. Unable to complete her HD prior to admission.  Tolerated HD in the hospital without any seizures. Discussed with nephrology and requested HD for Monday. Resume HD on discharge.    Hypothyroidism We  will continue Synthroid.    Intractable nausea and vomiting. Constipation. Had 2 episodes of vomiting on 6/30. None after that.  X-ray shows mild constipation. Will provide bowel regimen. Actually had 2 BM that day too   Essential hypertension We will continue Toprol-XL.   History of CVA (cerebrovascular accident) We will continue aspirin.   History of breast cancer We will continue Arimidex.   Hypoglycemia. Treated with D10.  Now improving without the dextrose.   Addendum: On the day of the discharge patient was seen at the time of the dialysis in which time she was alert awake and oriented did not have any acute issues. After the dialysis patient was confused.  Reportedly hallucinating.  When I saw her she said "I will give them 3500, it was decided".  She was able to tell me her name and her son's name.  Discussed with her son on the phone. He reports that she has done this kind of confusion/agitation episodes in the past after hemodialysis multiple times. He thinks that she is not in her family environment and therefore being around family would benefit her to recover from this confusion episode. At the time of my evaluation I do not think that there is any acute new issues going on.  Patient did not receive any medication.  No seizures notified at the time of the dialysis. I recommended the patient should be observed in the hospital for overnight to ensure that she remained stable and dentist agitation does not progress further as my concern is that that the patient does not take her  seizure medication she remains at risk for harm to herself and in worst case readmission. Son felt that patient will do better around the family and requested that the patient be discharged if there are no other medical concerns actively going on. I have recommended that, family should consider goals of care for her if the patient is not able to tolerate hemodialysis as this could be related to dialysis  disequilibrium syndrome as well.  Patient primarily admitted to the hospital with a seizure while having hemodialysis and now appears to have confusion episodes after hemodialysis. Patient may not be able to tolerate hemodialysis in the long.. I have recommended that family consider his options for focusing on comfort and consider hospice as an alternative philosophy of care. Given that I will be observing the patient for stability overnight, I agree with family's request for discharge home.  Lynden Oxford 6:34 PM 06/06/2023    Pain control - [facility] Controlled Substance Reporting System database was reviewed. and patient was instructed, not to drive, operate heavy machinery, perform activities at heights, swimming or participation in water activities or provide baby-sitting services while on Pain, Sleep and Anxiety Medications; until their outpatient Physician has advised to do so again. Also recommended to not to take more than prescribed Pain, Sleep and Anxiety Medications.  Consultants:  Neurology Nephrology Procedures performed:  LTM EEG monitoring HD  DISCHARGE MEDICATION: Allergies as of 06/06/2023       Reactions   Contrast Media [iodinated Contrast Media] Swelling   Latex Itching   Shellfish-derived Products Other (See Comments)   Other reaction(s): NO ALLERGY   Levemir [insulin Detemir] Itching        Medication List     STOP taking these medications    alum & mag hydroxide-simeth 400-400-40 MG/5ML suspension Commonly known as: MAALOX PLUS   amoxicillin-clavulanate 875-125 MG tablet Commonly known as: AUGMENTIN   azithromycin 250 MG tablet Commonly known as: ZITHROMAX   DIALYVITE 800 WITH ZINC 0.8 MG Tabs       TAKE these medications    (feeding supplement) PROSource Plus liquid Take 30 mLs by mouth 3 (three) times daily between meals. What changed: Another medication with the same name was removed. Continue taking this medication, and follow the  directions you see here.   acetaminophen 500 MG tablet Commonly known as: TYLENOL Take 1,000 mg by mouth daily.   anastrozole 1 MG tablet Commonly known as: ARIMIDEX Take 1 tablet (1 mg total) by mouth daily.   aspirin 81 MG chewable tablet Chew 1 tablet (81 mg total) by mouth daily.   bisacodyl 5 MG EC tablet Commonly known as: Dulcolax Take 1 tablet (5 mg total) by mouth daily as needed for moderate constipation.   calcitRIOL 0.5 MCG capsule Commonly known as: ROCALTROL Take 1 capsule (0.5 mcg total) by mouth every Monday, Wednesday, and Friday with hemodialysis.   chlorhexidine gluconate (MEDLINE KIT) 0.12 % solution Commonly known as: PERIDEX 15 mLs by Mouth Rinse route 2 (two) times daily.   dextrose 40 % Gel Commonly known as: SWEET CHEEKS Place 28 mLs inside cheek as needed (for suspected low sugars).   docusate sodium 100 MG capsule Commonly known as: Colace Take 1 capsule (100 mg total) by mouth 2 (two) times daily.   donepezil 10 MG tablet Commonly known as: ARICEPT Take 10 mg by mouth daily.   famotidine 10 MG tablet Commonly known as: PEPCID Take 10 mg by mouth daily.   Lacosamide 150 MG Tabs Take  1 tablet (150 mg total) by mouth 2 (two) times daily. What changed:  medication strength how much to take   latanoprost 0.005 % ophthalmic solution Commonly known as: XALATAN Place 1 drop into both eyes at bedtime.   levothyroxine 112 MCG tablet Commonly known as: SYNTHROID Take 1 tablet (112 mcg total) by mouth daily before breakfast.   lidocaine-prilocaine cream Commonly known as: EMLA Apply 1 application. topically 3 (three) times a week.   metoprolol succinate 25 MG 24 hr tablet Commonly known as: TOPROL-XL Take 1 tablet (25 mg total) by mouth daily. What changed:  medication strength how much to take   multivitamin Tabs tablet Take 1 tablet by mouth at bedtime.   Valtoco 15 MG Dose 2 x 7.5 MG/0.1ML Lqpk Generic drug: diazePAM (15 MG  Dose) Place 15 mg into the nose once as needed for up to 1 dose (for seizure lasting more than 2 minutes.).   Velphoro 500 MG chewable tablet Generic drug: sucroferric oxyhydroxide Chew 500 mg by mouth 3 (three) times daily with meals.       Disposition: Home Diet recommendation: Cardiac diet  Discharge Exam: Vitals:   06/06/23 0902 06/06/23 0906 06/06/23 0935 06/06/23 1003  BP: 137/60   139/72  Pulse: 83  84 84  Resp: 18  (!) 22 15  Temp:      TempSrc:      SpO2: 100%  100% 100%  Weight:  62.9 kg    Height:       General: Appear in mild distress; no visible Abnormal Neck Mass Or lumps, Conjunctiva normal Cardiovascular: S1 and S2 Present, no Murmur, Respiratory: good respiratory effort, Bilateral Air entry present and CTA, no Crackles, no wheezes Abdomen: Bowel Sound present, Non tender  Extremities: no Pedal edema Neurology: alert and oriented to place and person will respond, able to follow commands, oriented to herself, left-sided weakness chronic. Filed Weights   06/03/23 0805 06/03/23 1240 06/06/23 0906  Weight: 62.9 kg 61.4 kg 62.9 kg   Condition at discharge: stable  The results of significant diagnostics from this hospitalization (including imaging, microbiology, ancillary and laboratory) are listed below for reference.   Imaging Studies: DG Abd Portable 1V  Result Date: 06/05/2023 CLINICAL DATA:  Nausea and vomiting EXAM: PORTABLE ABDOMEN - 1 VIEW COMPARISON:  Abdomen radiographs done on 08/25/2016, CT abdomen done on 06/23/2022 FINDINGS: Bowel gas pattern is nonspecific. No abnormal masses or calcifications are seen. Small to moderate amount of stool is seen in right colon. There is no fecal impaction in rectosigmoid. IMPRESSION: Nonspecific bowel gas pattern. Electronically Signed   By: Ernie Avena M.D.   On: 06/05/2023 14:36   Overnight EEG with video  Result Date: 06/01/2023 [name], MD     06/02/2023 10:46 AM Patient Name: Kirsten Mcmahon MRN: [id number] Epilepsy Attending: [name] Referring Physician/Provider: Caryl Pina, MD Duration: 05/31/2023 2038 to 06/02/2023 2038 Patient history: 77 year old female with a history of right MCA stroke and left hemiplegia admitted due to increasing frequency of seizures hindering dialysis sessions. EEG to evaluate for seizure. Level of alertness: Awake, sleep AEDs during EEG study: LCM Technical aspects: This EEG study was done with scalp electrodes positioned according to the 10-20 International system of electrode placement. Electrical activity was reviewed with band pass filter of 1-70Hz , sensitivity of 7 uV/mm, display speed of 37mm/sec with a 60Hz  notched filter applied as appropriate. EEG data were recorded continuously and digitally stored.  Video monitoring was available  and reviewed as appropriate. Description: : The posterior dominant rhythm consists of 8Hz  activity of moderate voltage (25-35 uV) seen predominantly in posterior head regions, symmetric and reactive to eye opening and eye closing. Sleep was characterized by vertex waves, sleep spindles (12-14 Hz, maximal frontocentral region. EEG showed continuous generalized and lateralized right hemisphere 3 to 6 Hz theta-delta slowing. There are also sharply contoured waves in the right centro-parietal region consistent with underlying breach artifact. Spikes were noted in right>left frontotemporal region. Physiologic photic driving was not seen during photic stimulation.  Hyperventilation was not performed.   ABNORMALITY -Spike, right>left frontotemporal region. -Breach artifact, right centro-parietal region -Continuous slow, generalized and lateralized right hemisphere  IMPRESSION: This study is consistent with patient's history of epilepsy arising from right>left frontotemporal region. Additionally there is evidence of cortical dysfunction in right centro-parietal region consistent with prior craniotomy and underlying stroke.  Lastly there is also moderate degree of encephalopathy.  No seizures were seen throughout the recording.  [name]   CT HEAD WO CONTRAST ( )  Result Date: 05/30/2023 CLINICAL DATA:  New onset seizure.  Dialysis patient. EXAM: CT HEAD WITHOUT CONTRAST TECHNIQUE: Contiguous axial images were obtained from the base of the skull through the vertex without intravenous contrast. RADIATION DOSE REDUCTION: This exam was performed according to the departmental dose-optimization program which includes automated exposure control, adjustment of the mA and/or kV according to patient size and/or use of iterative reconstruction technique. COMPARISON:  l 05/25/2023 FINDINGS: Brain: Distant right frontal craniectomy. Old small vessel infarctions of the cerebellum. Old infarction in the right middle cerebral artery territory affecting the insular region and frontal operculum. Old infarction in the right occipital lobe. No CT evidence of acute infarction, mass, hemorrhage, hydrocephalus or extra-axial collection. Vascular: There is atherosclerotic calcification of the major vessels at the base of the brain. Skull: Otherwise negative. Sinuses/Orbits: Clear/normal Other: None IMPRESSION: No acute finding by CT. Old infarctions in the right middle cerebral artery territory, right occipital lobe and cerebellum. Distant right frontal craniectomy. Electronically Signed   By: Paulina Fusi M.D.   On: 05/30/2023 16:30   DG Chest Portable 1 View  Result Date: 05/30/2023 CLINICAL DATA:  Weakness, edema EXAM: PORTABLE CHEST 1 VIEW COMPARISON:  12/27/2022 FINDINGS: Single frontal view of the chest demonstrates stable enlargement of the cardiac silhouette and ectasia of the thoracic aorta. There is increased central vascular congestion with mild bilateral infrahilar airspace disease. Trace left effusion. No pneumothorax. IMPRESSION: 1. Findings consistent with mild congestive heart failure. Electronically Signed   By: Sharlet Salina M.D.   On: 05/30/2023 15:59   CT HEAD CODE STROKE WO CONTRAST  Result Date: 05/25/2023 CLINICAL DATA:  Code stroke. Neuro deficit, acute, stroke suspected. Right-sided gaze. EXAM: CT HEAD WITHOUT CONTRAST TECHNIQUE: Contiguous axial images were obtained from the base of the skull through the vertex without intravenous contrast. RADIATION DOSE REDUCTION: This exam was performed according to the departmental dose-optimization program which includes automated exposure control, adjustment of the mA and/or kV according to patient size and/or use of iterative reconstruction technique. COMPARISON:  CT Head 03/08/22 FINDINGS: Brain: Sequela of prior decompressive right-sided hemicraniectomy with extensive encephalomalacia in the right MCA territory involving the right frontal lobe in the right insular region. There is also a chronic right PCA territory infarct involving the right thalamus in the right occipital lobe. Additional infarct is also noted involving the bilateral cerebellar hemispheres. No hemorrhage. No definite CT evidence of a new cortical infarct. No hydrocephalus. No  extra-axial fluid collection. Vascular: No hyperdense vessel or unexpected calcification. Skull: Normal. Negative for fracture or focal lesion. Sinuses/Orbits: Middle ear or mastoid effusion. Paranasal sinuses are clear. Orbits are unremarkable. Other: None. ASPECTS [facility] Stroke Program Early CT Score): 10 when accounting for chronic findings. IMPRESSION: 1. No hemorrhage or CT evidence of an acute cortical infarct. Aspects is 10 when accounting for chronic findings 2. Sequela of moderate chronic microvascular ischemic change with multiple chronic infarcts involving the right MCA territory right PCA territory, in the bilateral cerebellar hemispheres Findings were paged to Dr. Iver Nestle on 05/25/23 at 5:22 PM vai AMION paging system. Electronically Signed   By: Lorenza Cambridge M.D.   On: 05/25/2023 17:23    Microbiology: Results for orders  placed or performed during the hospital encounter of 05/31/23  MRSA Next Gen by PCR, Nasal     Status: None   Collection Time: 06/01/23 12:53 AM   Specimen: Nasal Mucosa; Nasal Swab  Result Value Ref Range Status   MRSA by PCR Next Gen NOT DETECTED NOT DETECTED Final    Comment: (NOTE) The GeneXpert MRSA Assay (FDA approved for NASAL specimens only), is one component of a comprehensive MRSA colonization surveillance program. It is not intended to diagnose MRSA infection nor to guide or monitor treatment for MRSA infections. Test performance is not FDA approved in patients less than 77 years old. Performed at [facility], [address], [place], [place] [address]    Labs: CBC: Recent Labs  Lab 05/30/23 1524 05/31/23 0758 06/01/23 0428 06/02/23 0704 06/03/23 0654 06/04/23 0358 06/06/23 0917  WBC 11.1*   < > 8.8 7.0 7.4 9.0 8.2  NEUTROABS 9.0*  --   --   --   --   --   --   HGB 11.5*   < > 11.1* 10.8* 10.2* 9.9* 9.7*  HCT 36.3   < > 34.1* 33.7* 31.2* 30.6* 29.8*  MCV 103.1*   < > 102.7* 101.8* 98.7 102.3* 98.7  PLT 217   < > 180 193 207 209 251   < > = values in this interval not displayed.    Basic Metabolic Panel: Recent Labs  Lab 05/31/23 0758 05/31/23 2028 06/01/23 0428 06/02/23 0704 06/03/23 0654 06/04/23 0358 06/06/23 0918  NA 139  --  139 136 133* 131* 128*  K 3.2*  --  3.6 3.4* 4.0 4.2 5.1  CL 96*  --  96* 96* 92* 94* 90*  CO2 28  --  25 28 28 28 26   GLUCOSE 98  --  101* 81 100* 88 95  BUN 46*  --  52* 16 29* 15 37*  CREATININE 9.41*  --  10.93* 5.40* 7.15* 4.47* 8.23*  CALCIUM 8.3*  --  8.5* 8.3* 8.2* 8.4* 9.1  MG  --  2.4  --  1.7 1.7 1.6*  --   PHOS 5.8*  --   --  3.6 4.5 3.0 4.8*    Liver Function Tests: Recent Labs  Lab 05/31/23 0758 06/02/23 0704 06/03/23 0654 06/04/23 0358 06/06/23 0918  ALBUMIN 3.5 3.0* 2.9* 2.8* 3.0*    CBG: Recent Labs  Lab 06/05/23 0333 06/05/23 0909 06/05/23 1116 06/05/23 1626 06/05/23 2029  GLUCAP  119* 93 78 105* 173*     Discharge time spent: greater than 30 minutes.  Author: Lynden Oxford, MD  [facility]

## 2023-06-06 NOTE — Significant Event (Signed)
Patient is transferred via bed to HD with transport. Family (son) called and was updated this morning.

## 2023-06-06 NOTE — Progress Notes (Addendum)
[facility] Progress Note   Subjective:  Seen on HD - 2L UFG and tolerating. Denies CP/dyspnea.     Objective Vitals:   06/06/23 0712 06/06/23 0810 06/06/23 0902 06/06/23 0906  BP: 126/65 137/63 137/60   Pulse: 73 80 83   Resp: 18 (!) 21 18   Temp: 98.3 F (36.8 C) (!) 97.3 F (36.3 C)    TempSrc: Oral Oral    SpO2: 100% 100% 100%   Weight:    62.9 kg  Height:       Physical Exam General: Chronically ill woman, NAD. Room air. Heart: RRR; no murmur Lungs: CTA anteriorly Abdomen: soft Extremities: no LE edema Dialysis Access:  R AVG + bruit  Additional Objective Labs: Basic Metabolic Panel: Recent Labs  Lab 06/02/23 0704 06/03/23 0654 06/04/23 0358  NA 136 133* 131*  K 3.4* 4.0 4.2  CL 96* 92* 94*  CO2 28 28 28   GLUCOSE 81 100* 88  BUN 16 29* 15  CREATININE 5.40* 7.15* 4.47*  CALCIUM 8.3* 8.2* 8.4*  PHOS 3.6 4.5 3.0   Liver Function Tests: Recent Labs  Lab 06/02/23 0704 06/03/23 0654 06/04/23 0358  ALBUMIN 3.0* 2.9* 2.8*   CBC: Recent Labs  Lab 05/30/23 1524 05/31/23 0758 06/01/23 0428 06/02/23 0704 06/03/23 0654 06/04/23 0358  WBC 11.1* 7.9 8.8 7.0 7.4 9.0  NEUTROABS 9.0*  --   --   --   --   --   HGB 11.5* 11.1* 11.1* 10.8* 10.2* 9.9*  HCT 36.3 34.8* 34.1* 33.7* 31.2* 30.6*  MCV 103.1* 101.5* 102.7* 101.8* 98.7 102.3*  PLT 217 207 180 193 207 209   Studies/Results: DG Abd Portable 1V  Result Date: 06/05/2023 CLINICAL DATA:  Nausea and vomiting EXAM: PORTABLE ABDOMEN - 1 VIEW COMPARISON:  Abdomen radiographs done on 08/25/2016, CT abdomen done on 06/23/2022 FINDINGS: Bowel gas pattern is nonspecific. No abnormal masses or calcifications are seen. Small to moderate amount of stool is seen in right colon. There is no fecal impaction in rectosigmoid. IMPRESSION: Nonspecific bowel gas pattern. Electronically Signed   By: Ernie Avena M.D.   On: 06/05/2023 14:36    Medications:   heparin sodium (porcine)       anastrozole   1 mg Oral Daily   aspirin  81 mg Oral Daily   calcitRIOL  0.75 mcg Oral Q M,W,F-2000   Chlorhexidine Gluconate Cloth  6 each Topical Q0600   donepezil  10 mg Oral Daily   famotidine  10 mg Oral Daily   lacosamide  150 mg Oral BID   latanoprost  1 drop Both Eyes QHS   levothyroxine  112 mcg Oral QAC breakfast   metoCLOPramide (REGLAN) injection  5 mg Intravenous QID   mouth rinse  15 mL Mouth Rinse UD   senna-docusate  2 tablet Oral BID   sucroferric oxyhydroxide  500 mg Oral TID WC    Dialysis Orders: MWF [facility]  [address]    3h  350/ 1.5  59.3kg  2/2.5 bath AVG Heparin 2000 units IV TIW  - pt missed 6/21 HD and 6/24 HD was aborted at 2 hrs due to seizure, otherwise good compliance - calcitriol 0.75 mcg PO q HD - Venofer 50mg  weekly IV - Mircera 75 mcg IV q 4 wks, last 5/29    Assessment/ Plan: Breakthrough seizures: Known seizure disorder, not sure she was taking Vimpat at home. Seems to be a little better. Baseline dementia. ESRD: Continue HD on usual MWF  schedule - HD today, 2L UFG. HTN/volume: BP controlled, no edema on exam but with hypoNa - UF as tolerated. Anemia of ESRD: Hgb 9.9 - overdue for ESA - will reduce dose and given today. Secondary HPTH: Ca/Phos stable, continue VDRA + binder (Velphoro) Hx CVA Hypothyroidism N/V: Better; on Reglan.  Ozzie Hoyle, PA-C 06/06/2023, 9:26 AM  [facility]  Nephrology attending:  I have personally seen and examined the patient at dialysis unit.  Chart reviewed.  I agree with assessment and plan as outlined below.  She is tolerating dialysis well.  Order ESA for anemia.  Monitor lab. Continue dialysis outpatient after discharge.  Eddie North, [facility].

## 2023-06-06 NOTE — Progress Notes (Signed)
Physical Therapy Treatment Patient Details Name: Kirsten Mcmahon MRN: [id number] DOB: 12/31/1945 Today's Date: 06/06/2023   History of Present Illness Patient is a 77 y/o female admitted 05/31/23 due to increased seizures during HD sessions.  She underwent EEG.  PMH positive for R breast cancer, CKD on HD, stroke with L hemiparesis (wheelchair bound), GERD, glaucoma, hypothyroidism.    PT Comments  Pt returned from HD and received in bed confused, thinking she is at home and paranoid that someone is hiding in the bathroom and has ill will towards her. Pt following commands only minimally. Needed mod A to come to EOB in sitting. Once sitting pt very dizzy with brief periods of cessation in conversation and unfocused eyes, then would begin speaking again. Did not attempt transfer for this reason. Pt did not want to return to supine because she did not believe it was her bed and max A needed to guide her to supine. Pt then scratched therapist on the arm when sliding her up. RN assisted with rest of positioning. Have concerns about her going home today with this mental status. PT will continue to follow.     Assistance Recommended at Discharge Frequent or constant Supervision/Assistance  If plan is discharge home, recommend the following:  Can travel by private vehicle    Two people to help with bathing/dressing/bathroom;A lot of help with walking and/or transfers;Assist for transportation;Direct supervision/assist for medications management;Assistance with cooking/housework;Help with stairs or ramp for entrance      Equipment Recommendations  None recommended by PT    Recommendations for Other Services       Precautions / Restrictions Precautions Precautions: Fall Precaution Comments: L spastic hemiparesis Restrictions Weight Bearing Restrictions: No     Mobility  Bed Mobility Overal bed mobility: Needs Assistance Bed Mobility: Supine to Sit, Sit to Supine     Supine to sit: Mod  assist Sit to supine: Max assist   General bed mobility comments: mod A for LE's off EOB and elevation of trunk into sitting. Pt wanting to get OOB but very dizzy so did not allow her to attempt transferring but she did not want to lie back down so needed max A for this and this was when she scratched therapist    Transfers                   General transfer comment: NT due to dizziness and AMS    Ambulation/Gait               General Gait Details: w/c bound at baseline   Stairs             Wheelchair Mobility     Tilt Bed    Modified Rankin (Stroke Patients Only) Modified Rankin (Stroke Patients Only) Pre-Morbid Rankin Score: Severe disability Modified Rankin: Severe disability     Balance Overall balance assessment: Needs assistance Sitting-balance support: Feet supported, Single extremity supported Sitting balance-[name] Scale: Poor Sitting balance - Comments: posterior lean, consistent min A to maintain balance EOB Postural control: Posterior lean                                  Cognition Arousal/Alertness: Awake/alert Behavior During Therapy: Agitated Overall Cognitive Status: Impaired/Different from baseline  General Comments: pt alert and verbalizing but completely disoriented and thinking there is someone hiding in the bathroom who wants to harm her        Exercises      General Comments General comments (skin integrity, edema, etc.): RN present end of session and assisted with positioning in bed since pt agitated. Pt apologetic after outburst      Pertinent Vitals/Pain Pain Assessment Pain Assessment: Faces Faces Pain Scale: No hurt    Home Living                          Prior Function            PT Goals (current goals can now be found in the care plan section) Acute Rehab PT Goals Patient Stated Goal: to return home PT Goal Formulation: With  patient/family Time For Goal Achievement: 06/17/23 Potential to Achieve Goals: Fair Progress towards PT goals: Not progressing toward goals - comment (agitation)    Frequency    Min 3X/week      PT Plan Current plan remains appropriate    Co-evaluation              AM-PAC PT "6 Clicks" Mobility   Outcome Measure  Help needed turning from your back to your side while in a flat bed without using bedrails?: A Lot Help needed moving from lying on your back to sitting on the side of a flat bed without using bedrails?: A Lot Help needed moving to and from a bed to a chair (including a wheelchair)?: Total Help needed standing up from a chair using your arms (e.g., wheelchair or bedside chair)?: Total Help needed to walk in hospital room?: Total Help needed climbing 3-5 steps with a railing? : Total 6 Click Score: 8    End of Session   Activity Tolerance: Patient limited by fatigue;Treatment limited secondary to agitation Patient left: in bed;with call bell/phone within reach;with bed alarm set Nurse Communication: Mobility status;Other (comment) (mental status) PT Visit Diagnosis: Other abnormalities of gait and mobility (R26.89);Muscle weakness (generalized) (M62.81)     Time: 6387-5643 PT Time Calculation (min) (ACUTE ONLY): 21 min  Charges:    $Therapeutic Activity: 8-22 mins PT General Charges $$ ACUTE PT VISIT: 1 Visit                     Lyanne Co, PT  Acute Rehab Services Secure chat preferred Office [phone]    Lawana Chambers Meggie Laseter 06/06/2023, 2:44 PM

## 2023-06-06 NOTE — Progress Notes (Signed)
D/C order noted. Contacted [facility] to advise clinic of pt's d/c today and that pt should resume care on Wednesday.   Olivia Canter Renal Navigator [phone]

## 2023-06-06 NOTE — Plan of Care (Signed)
  Problem: Education: Goal: Knowledge of General Education information will improve Description: Including pain rating scale, medication(s)/side effects and non-pharmacologic comfort measures Outcome: Adequate for Discharge   Problem: Health Behavior/Discharge Planning: Goal: Ability to manage health-related needs will improve Outcome: Adequate for Discharge   Problem: Clinical Measurements: Goal: Ability to maintain clinical measurements within normal limits will improve Outcome: Adequate for Discharge Goal: Will remain free from infection Outcome: Adequate for Discharge Goal: Diagnostic test results will improve Outcome: Adequate for Discharge Goal: Respiratory complications will improve Outcome: Adequate for Discharge Goal: Cardiovascular complication will be avoided Outcome: Adequate for Discharge   Problem: Activity: Goal: Risk for activity intolerance will decrease Outcome: Adequate for Discharge   

## 2023-06-06 NOTE — Progress Notes (Signed)
PT Cancellation Note  Patient Details Name: Kirsten Mcmahon MRN: [id number] DOB: [birth year]   Cancelled Treatment:    Reason Eval/Treat Not Completed: Patient at procedure or test/unavailable. HD  Lyanne Co, PT  Acute Rehab Services Secure chat preferred Office [phone]    Lawana Chambers Marcine Gadway 06/06/2023, 10:01 AM

## 2023-06-06 NOTE — Plan of Care (Signed)
[facility] Patient Discharge Orders - [facility] CLINIC: [facility] [place]  Patient's name: CARALEE GHATTAS Admit/DC Dates: 05/31/2023 - 06/06/2023  DISCHARGE DIAGNOSES: Recurrent breakthrough seizures - Vimpat dose was increased  Nausea/vomiting - KUB showed constipation, treated with bowel regimen and resolved.  HD ORDER CHANGES: Heparin change: no EDW Change: yes New EDW: 57 kg Bath Change: no  ANEMIA MANAGEMENT: Aranesp: Given: yes   Amount/Date of last dose: Aranesp to be given 7/1 ESA dose for discharge: Mircera 75mg  IV q 2 weeks - give next on 7/8 IV Iron dose at discharge: per protocol Transfusion: Given: no  BONE/MINERAL MEDICATIONS: Hectorol/Calcitriol change: no Sensipar/Parsabiv change: no  ACCESS INTERVENTION/CHANGE: no Details:  RECENT LABS: Recent Labs  Lab 06/06/23 0917 06/06/23 0918  HGB 9.7*  --   NA  --  128*  K  --  5.1  CALCIUM  --  9.1  PHOS  --  4.8*  ALBUMIN  --  3.0*    IV ANTIBIOTICS: no Details:  OTHER ANTICOAGULATION: On Coumadin?: no Last INR: Managed By:  OTHER/APPTS/LAB ORDERS:   D/C Meds to be reconciled by nurse after every discharge.  Completed By: Ozzie Hoyle, PA-C [facility] Pager [phone]   Reviewed by: MD:______ RN_______

## 2023-06-06 NOTE — Significant Event (Signed)
RN called patient's son as he is not here yet post shift change. Per patient's son [name], says he is finishing up a job and will definitely be here in 30 minutes. Nightshift staff made aware.

## 2023-06-06 NOTE — Progress Notes (Addendum)
Received patient in bed.Awake,alert ad oriented x 2.Patient was sleepy.Consent verified.  Access used :Right upper arm AVG that worked well.  Medicine given: Heparin 2,000 units pre -run dose.  Duration of treatment : 3.75 hour.  Fluid removed :  Achieved prescribed UF goal of 1,900 cc.  Hemo comment:Patient become restless during the last hour of treatment.She is very confused and alert to self.Short focus of attention  and was trying to get out from the bed.  Hand off to the patient's nurse.

## 2023-06-06 NOTE — TOC Progression Note (Signed)
Transition of Care [facility]) - Progression Note    Patient Details  Name: ADYSIN SPITALE MRN: [id number] Date of Birth: 26-May-1946  Transition of Care [facility]) CM/SW Contact  Kermit Balo, RN Phone Number: 06/06/2023, 3:39 PM  Clinical Narrative:    Per MD pt is not medically ready for d/c. CM has updated SW with [facility]. TOC following.   Expected Discharge Plan: Home w Home Health Services Barriers to Discharge: Continued Medical Work up  Expected Discharge Plan and Services   Discharge Planning Services: CM Consult   Living arrangements for the past 2 months: Single Family Home Expected Discharge Date: 06/06/23                                     Social Determinants of Health (SDOH) Interventions SDOH Screenings   Food Insecurity: No Food Insecurity (05/31/2023)  Housing: Patient Unable To Answer (06/01/2023)  Transportation Needs: No Transportation Needs (05/31/2023)  Utilities: Not At Risk (05/31/2023)  Tobacco Use: Low Risk  (06/01/2023)    Readmission Risk Interventions    05/20/2021    2:15 PM 05/16/2021   11:27 AM 04/11/2021   10:53 AM  Readmission Risk Prevention Plan  Transportation Screening Complete Complete Complete  PCP or Specialist Appt within 3-5 Days   Complete  HRI or Home Care Consult   Complete  Social Work Consult for Recovery Care Planning/Counseling   Complete  Palliative Care Screening   Not Applicable  Medication Review Oceanographer) Complete Complete Complete  PCP or Specialist appointment within 3-5 days of discharge Complete Complete   HRI or Home Care Consult Complete Complete   SW Recovery Care/Counseling Consult  Complete   Palliative Care Screening Complete Complete   Skilled Nursing Facility Patient Refused Patient Refused

## 2023-06-06 NOTE — Progress Notes (Signed)
Pt family at bedside, PIV dc'd, discharge instructions discussed w/ family, no questions or concerns noted at [facility] time, pt escorted to car via WC and x1 assist

## 2023-06-07 LAB — HEPATITIS B SURFACE ANTIBODY, QUANTITATIVE: Hep B S AB Quant (Post): 522 m[IU]/mL (ref >9.9)

## 2023-06-08 ENCOUNTER — Telehealth (HOSPITAL_COMMUNITY): Payer: Self-pay | Admitting: Nephrology

## 2023-06-08 NOTE — Telephone Encounter (Signed)
Transition of Care - Initial Contact from Inpatient Facility  Date of discharge: 06/06/23 Date of contact: 06/08/23  Method: Phone Spoke to: Patient's son - [name]  Patient contacted to discuss transition of care from recent inpatient hospitalization. Patient was admitted to [facility] from 05/31/23 - 06/06/23 with discharge diagnosis of recurrent breakthrough seizures, constipation  The discharge medication list was reviewed. Patient understands the changes and has no concerns. Now on higher dose Vimpat.  Patient will return to his/her outpatient HD unit on: Today -> son reports she is there now Berenice Primas)  No other concerns at this time.  Ozzie Hoyle, PA-C [employer] Pager [phone]

## 2023-06-10 ENCOUNTER — Encounter: Payer: Self-pay | Admitting: Neurology

## 2023-06-21 ENCOUNTER — Ambulatory Visit: Payer: Medicare (Managed Care) | Admitting: Neurology

## 2023-07-12 ENCOUNTER — Ambulatory Visit: Payer: Medicare (Managed Care) | Admitting: Neurology

## 2023-07-21 ENCOUNTER — Ambulatory Visit
Admission: RE | Admit: 2023-07-21 | Discharge: 2023-07-21 | Disposition: A | Discharge status: Home / Self Care | Payer: Medicare (Managed Care) | Source: Ambulatory Visit | Attending: Hematology and Oncology | Admitting: Hematology and Oncology

## 2023-07-21 ENCOUNTER — Other Ambulatory Visit: Payer: Self-pay | Admitting: Hematology and Oncology

## 2023-07-21 DIAGNOSIS — Z170 Estrogen receptor positive status [ER+]: Secondary | ICD-10-CM

## 2023-07-25 ENCOUNTER — Telehealth: Payer: Self-pay

## 2023-07-25 NOTE — Telephone Encounter (Signed)
Received call from Tonye Becket, NP with [facility]. She voiced concerns for pt's most recent MM/US results. Pt is currently on AI therapy for known breast cancer.   Attempted to call pt for appt to come in and further discuss. LVM for call back.  [place] is going to set pt up with transportation to be seen 07/29/23 by Lillard Anes, DNP at [phone].

## 2023-07-29 ENCOUNTER — Other Ambulatory Visit: Payer: Self-pay

## 2023-07-29 ENCOUNTER — Inpatient Hospital Stay: Payer: Medicare (Managed Care) | Attending: Adult Health | Admitting: Adult Health

## 2023-07-29 VITALS — BP 104/59 | HR 92 | Temp 97.9°F | Resp 18

## 2023-07-29 DIAGNOSIS — Z79899 Other long term (current) drug therapy: Secondary | ICD-10-CM | POA: Diagnosis not present

## 2023-07-29 DIAGNOSIS — Z170 Estrogen receptor positive status [ER+]: Secondary | ICD-10-CM | POA: Insufficient documentation

## 2023-07-29 DIAGNOSIS — Z79811 Long term (current) use of aromatase inhibitors: Secondary | ICD-10-CM | POA: Diagnosis not present

## 2023-07-29 DIAGNOSIS — N6489 Other specified disorders of breast: Secondary | ICD-10-CM | POA: Insufficient documentation

## 2023-07-29 DIAGNOSIS — E039 Hypothyroidism, unspecified: Secondary | ICD-10-CM | POA: Diagnosis not present

## 2023-07-29 DIAGNOSIS — C50411 Malignant neoplasm of upper-outer quadrant of right female breast: Secondary | ICD-10-CM | POA: Diagnosis present

## 2023-07-29 DIAGNOSIS — E1122 Type 2 diabetes mellitus with diabetic chronic kidney disease: Secondary | ICD-10-CM | POA: Diagnosis not present

## 2023-07-29 DIAGNOSIS — Z8673 Personal history of transient ischemic attack (TIA), and cerebral infarction without residual deficits: Secondary | ICD-10-CM | POA: Diagnosis not present

## 2023-07-29 DIAGNOSIS — Z90710 Acquired absence of both cervix and uterus: Secondary | ICD-10-CM | POA: Diagnosis not present

## 2023-07-29 DIAGNOSIS — N186 End stage renal disease: Secondary | ICD-10-CM | POA: Insufficient documentation

## 2023-07-29 DIAGNOSIS — Z992 Dependence on renal dialysis: Secondary | ICD-10-CM | POA: Insufficient documentation

## 2023-07-29 NOTE — Progress Notes (Unsigned)
[facility] Follow up:    Kirsten Mcmahon, Kirsten Mcmahon [address] [place] [address]   DIAGNOSIS: Cancer Staging  No matching staging information was found for the patient.   SUMMARY OF ONCOLOGIC HISTORY: Oncology History  Malignant neoplasm of upper-outer quadrant of right breast in female, estrogen receptor positive (HCC)  03/14/2015 Initial Diagnosis   On workup for blood work cancer PET/CT showed right breast lesion, 11:00 position 2.6 cm diameter biopsy done 05/20/2015 IDC grade 2 ER 95% PR 80% HER-2 negative   07/14/2015 Surgery   Right lumpectomy: IDC grade 2, 2.5 cm, 1/2 sentinel nodes positive, DCIS, LCIS, lateral inferior and deep margins positive for LCIS, T2 N1 M0 stage II a, radiation did not recommend adjuvant radiation ( at [facility])   08/21/2015 -  Anti-estrogen oral therapy   Letrozole 2.5 mg daily   02/15/2023 Relapse/Recurrence   Right breast nipple changes, new heterogeneous calcifications, new asymmetry right retroareolar region 2 cm area, right axilla: 1 abnormal lymph node, ill-defined hypoechoic area left breast 5.1 cm diffuse skin thickening left breast: Right breast biopsy: Grade 2 IDC ER 95%, PR 95%, Ki-67 5%, HER2 1+ negative; left breast biopsy: Benign   Primary vulvar squamous cell carcinoma (HCC)  03/25/2015 Surgery   Vulvectomy by Dr. Tresa Endo at [facility] Kirsten Mcmahon; invasive squamous cell carcinoma margins are negative, did not require any further treatment     CURRENT THERAPY: Anastrozole  INTERVAL HISTORY: Kirsten Mcmahon 77 y.o. female returns for f/u of her right breast cancer.  She underwent mammogram and ultrasound of her right breast cancer, and it demonstrated progression of her cancer.    She notes the tumor is painful and she has taken tylenol and it doesn't seem to help very much.  She has ESRD and cannot take Advil.  She tells me that she doesn't understand why this is happening to her.    I attempted to call  her son [name] in the appointment and he did not answer.   Patient Active Problem List   Diagnosis Date Noted   Seizures (HCC) 05/31/2023   History of breast cancer 05/31/2023   History of CVA (cerebrovascular accident) 05/31/2023   Delirium due to known physiological condition 08/03/2021   Seizure (HCC) 07/17/2021   Mixed diabetic hyperlipidemia associated with type 2 diabetes mellitus (HCC) 07/16/2021   Recurrent seizures (HCC) 07/15/2021   Encounter for immunization 07/14/2021   Pain, unspecified 07/13/2021   Hypokalemia 06/02/2021   Other pneumonia, unspecified organism 05/26/2021   Recent cerebrovascular accident (CVA) 05/17/2021   History of CVA with residual deficit 05/17/2021   Cerebral embolism with cerebral infarction 05/13/2021   Physical deconditioning 04/30/2021   Generalized muscle weakness 04/30/2021   Pressure injury of skin 04/29/2021   AKI (acute kidney injury) (HCC) 04/28/2021   Moderate protein-calorie malnutrition (HCC) 04/28/2021   Other bacterial infections of unspecified site 04/28/2021   Streptococcal sepsis, unspecified (HCC) 04/18/2021   Streptococcal bacteremia 04/18/2021   Leukocytosis 04/17/2021   Fever 04/17/2021   Allergy, unspecified, initial encounter 04/16/2021   Anaphylactic shock, unspecified, initial encounter 04/16/2021   Complication of vascular dialysis catheter 04/16/2021   Iron deficiency anemia, unspecified 04/16/2021   Pruritus, unspecified 04/16/2021   Secondary hyperparathyroidism of renal origin (HCC) 04/16/2021   Type 2 diabetes mellitus with diabetic peripheral angiopathy without gangrene (HCC) 04/16/2021   End-stage renal disease on hemodialysis (HCC)    Acute kidney injury superimposed on CKD (HCC) 04/09/2021   ARF (acute renal  failure) (HCC) 01/18/2021   Fall 01/17/2021   Essential hypertension    Hypothyroidism    Stroke (HCC)    GERD (gastroesophageal reflux disease)    Anemia in chronic kidney disease (CKD)    Acute  metabolic encephalopathy    Acute renal failure superimposed on stage 3b chronic kidney disease (HCC)    Type 2 diabetes mellitus with ESRD (end-stage renal disease) (HCC)    Abnormal mammogram of left breast 07/30/2018   Closed displaced oblique fracture of shaft of left humerus 03/14/2018   Osteopenia 01/20/2018   Multiple thyroid nodules 07/13/2017   Tracheal deviation 07/13/2017   Malignant neoplasm of upper-outer quadrant of right breast in female, estrogen receptor positive (HCC) 01/20/2017   Primary vulvar squamous cell carcinoma (HCC) 01/20/2017    is allergic to contrast media [iodinated contrast media], latex, shellfish-derived products, and levemir [insulin detemir].  MEDICAL HISTORY: Past Medical History:  Diagnosis Date   Cancer (HCC) 2017   Right breast   Chronic kidney disease    progression to ESRD 04/10/21   GERD (gastroesophageal reflux disease)    Glaucoma    Hemiparesis (HCC)    left side   High cholesterol    History of seizure    after a spider bite; 07/15/21   History of stroke with residual deficit    left-side weakness   Hypertension    states BP under control with meds., has been on med. x 2 yr.   Hypothyroidism    Non-insulin dependent type 2 diabetes mellitus (HCC)    Overactive bladder    PFO (patent foramen ovale) 05/15/2021   Stroke (HCC)    1998 weakness on left side; 05/12/21    SURGICAL HISTORY: Past Surgical History:  Procedure Laterality Date   ABDOMINAL HYSTERECTOMY     complete   AV FISTULA PLACEMENT Right 09/29/2021   Procedure: INSERTION OF RIGHT ARM ARTERIOVENOUS (AV) GORE-TEX GRAFT;  Surgeon: Chuck Hint, MD;  Location: [facility] OR;  Service: Vascular;  Laterality: Right;   BREAST BIOPSY Left 08/03/2018   Benign adipose tissue   BREAST BIOPSY Right 02/15/2023   [place] RT BREAST BX W LOC DEV 1ST LESION IMG BX SPEC US GUIDE 02/15/2023 [facility] MAMMOGRAPHY   BREAST BIOPSY Left 02/15/2023   [place] LT BREAST BX W LOC DEV 1ST LESION IMG BX  SPEC US GUIDE 02/15/2023 [facility] MAMMOGRAPHY   BREAST EXCISIONAL BIOPSY Right 2014   Positive   BREAST LUMPECTOMY Right    BUBBLE STUDY  05/15/2021   Procedure: BUBBLE STUDY;  Surgeon: Jake Bathe, MD;  Location: [facility] ENDOSCOPY;  Service: Cardiovascular;;   CATARACT EXTRACTION W/ INTRAOCULAR LENS IMPLANT Left    CEREBRAL ANEURYSM REPAIR  1998   DIALYSIS/PERMA CATHETER INSERTION N/A 04/10/2021   Procedure: DIALYSIS/PERMA CATHETER INSERTION;  Surgeon: Annice Needy, MD;  Location: [facility] INVASIVE CV LAB;  Service: Cardiovascular;  Laterality: N/A;   IR FLUORO GUIDE CV LINE RIGHT  04/30/2021   PICC LINE INSERTION     TEE WITHOUT CARDIOVERSION N/A 05/15/2021   Procedure: TRANSESOPHAGEAL ECHOCARDIOGRAM (TEE);  Surgeon: Jake Bathe, MD;  Location: [facility] ENDOSCOPY;  Service: Cardiovascular;  Laterality: N/A;   THYROID LOBECTOMY Right 07/15/2017   Procedure: RIGHT THYROID LOBECTOMY;  Surgeon: Darnell Level, MD;  Location: [facility] OR;  Service: General;  Laterality: Right;    SOCIAL HISTORY: Social History   Socioeconomic History   Marital status: Single    Spouse name: Not on file   Number of children: Not on file  Years of education: Not on file   Highest education level: Not on file  Occupational History   Not on file  Tobacco Use   Smoking status: Never   Smokeless tobacco: Never  Vaping Use   Vaping status: Never Used  Substance and Sexual Activity   Alcohol use: No   Drug use: No   Sexual activity: Never    Birth control/protection: Post-menopausal  Other Topics Concern   Not on file  Social History Narrative   Lives with son and wife   Social Determinants of Health   Financial Resource Strain: Not on file  Food Insecurity: No Food Insecurity (05/31/2023)   Hunger Vital Sign    Worried About Running Out of Food in the Last Year: Never true    Ran Out of Food in the Last Year: Never true  Transportation Needs: No Transportation Needs (05/31/2023)   PRAPARE - Scientist, research (physical sciences) (Medical): No    Lack of Transportation (Non-Medical): No  Physical Activity: Not on file  Stress: Not on file  Social Connections: Not on file  Intimate Partner Violence: Not At Risk (05/31/2023)   Humiliation, Afraid, Rape, and Kick questionnaire    Fear of Current or Ex-Partner: No    Emotionally Abused: No    Physically Abused: No    Sexually Abused: No    FAMILY HISTORY: Family History  Problem Relation Age of Onset   Cancer Brother        possible prostate cancer per her daughter   Breast cancer Neg Hx     Review of Systems - Oncology    PHYSICAL EXAMINATION   Onc Performance Status - 07/29/23 1500       ECOG Perf Status   ECOG Perf Status Fully active, able to carry on all pre-disease performance without restriction      KPS SCALE   KPS % SCORE Requires occasional assistance but is able to care for most needs             Vitals:   07/29/23 1556  BP: (!) 104/59  Pulse: 92  Resp: 18  Temp: 97.9 F (36.6 C)  SpO2: 100%    Physical Exam  LABORATORY DATA:  CBC    Component Value Date/Time   WBC 8.2 06/06/2023 0917   RBC 3.02 (L) 06/06/2023 0917   HGB 9.7 (L) 06/06/2023 0917   HCT 29.8 (L) 06/06/2023 0917   PLT 251 06/06/2023 0917   MCV 98.7 06/06/2023 0917   MCH 32.1 06/06/2023 0917   MCHC 32.6 06/06/2023 0917   RDW 13.6 06/06/2023 0917   LYMPHSABS 1.4 05/30/2023 1524   MONOABS 0.5 05/30/2023 1524   EOSABS 0.1 05/30/2023 1524   BASOSABS 0.0 05/30/2023 1524    CMP     Component Value Date/Time   NA 128 (L) 06/06/2023 0918   K 5.1 06/06/2023 0918   CL 90 (L) 06/06/2023 0918   CO2 26 06/06/2023 0918   GLUCOSE 95 06/06/2023 0918   BUN 37 (H) 06/06/2023 0918   CREATININE 8.23 (H) 06/06/2023 0918   CALCIUM 9.1 06/06/2023 0918   PROT 8.0 05/25/2023 1732   ALBUMIN 3.0 (L) 06/06/2023 0918   AST 25 05/25/2023 1732   ALT 18 05/25/2023 1732   ALKPHOS 73 05/25/2023 1732   BILITOT 0.4 05/25/2023 1732   GFRNONAA 5 (L)  06/06/2023 0918   GFRAA 34 (L) 08/01/2018 1056       PENDING LABS:   RADIOGRAPHIC STUDIES:  No results found.   PATHOLOGY:     ASSESSMENT and THERAPY PLAN:   No problem-specific Assessment & Plan notes found for this encounter.   No orders of the defined types were placed in this encounter.   All questions were answered. The patient knows to call the clinic with any problems, questions or concerns. We can certainly see the patient much sooner if necessary. This note was electronically signed. Noreene Filbert, NP 07/29/2023

## 2023-08-01 ENCOUNTER — Telehealth: Payer: Self-pay | Admitting: Nurse Practitioner

## 2023-08-01 NOTE — Telephone Encounter (Signed)
Called patient's son [name] this afternoon to review his mom's appointment from Friday.  He did not answer the phone.  I left a message on his machine to call us back.  Lillard Anes, NP 08/01/23 4:25 PM Medical Oncology and Hematology [facility] [address] [place], [place] [address] Tel. [phone]    Fax. [phone]

## 2023-08-03 ENCOUNTER — Telehealth: Payer: Self-pay | Admitting: Hematology and Oncology

## 2023-08-03 NOTE — Telephone Encounter (Signed)
Scheduled apointmetn per los. Talked with the patients son and he is aware of the made appointment for the patient.

## 2023-08-04 ENCOUNTER — Ambulatory Visit: Payer: Medicare (Managed Care) | Admitting: Neurology

## 2023-08-04 NOTE — Assessment & Plan Note (Signed)
02/15/23: Right breast nipple changes, new heterogeneous calcifications, new asymmetry right retroareolar region 2 cm area, right axilla: 1 abnormal lymph node, ill-defined hypoechoic area left breast 5.1 cm diffuse skin thickening left breast: Right breast biopsy: Grade 2 IDC ER 95%, PR 95%, Ki-67 5%, HER2 1+ negative; left breast biopsy: Benign   (07/14/2015: Right lumpectomy: IDC grade 2, 2.5 cm, 1/2 sentinel nodes positive, DCIS, LCIS, lateral inferior and deep margins positive for LCIS, T2 N1 M0 stage II a, radiation did not recommend adjuvant radiation ( at [facility]) followed by antiestrogen therapy with letrozole 08/21/19  Rand's breast cancer has progressed on mammogram.  I placed orders for restaging scans with CT chest/abd/pelvis to better understand extend of cancer.  She will see Dr. Pamelia Hoit after her scans, and I also placed a referral to palliative care for evaluation and discussion of goals of care.    I attempted to call her son twice with no response both during and after the appointment.

## 2023-08-09 ENCOUNTER — Ambulatory Visit (HOSPITAL_COMMUNITY): Payer: Medicare (Managed Care)

## 2023-08-12 ENCOUNTER — Other Ambulatory Visit: Payer: Self-pay | Admitting: Hematology and Oncology

## 2023-08-12 ENCOUNTER — Telehealth: Payer: Self-pay | Admitting: Hematology and Oncology

## 2023-08-12 DIAGNOSIS — C50411 Malignant neoplasm of upper-outer quadrant of right female breast: Secondary | ICD-10-CM

## 2023-08-12 NOTE — Telephone Encounter (Signed)
I discussed with patient's son about the findings on the mammogram and ultrasound. He is agreeable that she should get a CT of the chest abdomen pelvis. He is not agreeable to undergo another biopsy. He would like her to undergo surgery to remove the area of involvement without undergoing additional procedures to avoid any discomfort to her.  I will request surgery consultation as we await the results of the CT scan.

## 2023-08-17 ENCOUNTER — Other Ambulatory Visit: Payer: Self-pay | Admitting: *Deleted

## 2023-08-18 ENCOUNTER — Ambulatory Visit (HOSPITAL_COMMUNITY): Payer: Medicare (Managed Care)

## 2023-08-18 ENCOUNTER — Telehealth: Payer: Self-pay | Admitting: Adult Health

## 2023-08-18 ENCOUNTER — Ambulatory Visit (HOSPITAL_COMMUNITY): Payer: Medicare (Managed Care) | Attending: Adult Health

## 2023-08-18 NOTE — Telephone Encounter (Signed)
Called to f/u with pt son about no show to CT appt. Pt son stated that transportation was to bring her and his sister meeting them her for appt. Gave number to centralized scheduling to reschedule and update pt facility on new appt to arrange transportation. Pt son verbalized understanding.

## 2023-08-19 ENCOUNTER — Ambulatory Visit (HOSPITAL_COMMUNITY)
Admission: RE | Admit: 2023-08-19 | Discharge: 2023-08-19 | Disposition: A | Discharge status: Home / Self Care | Payer: Medicare (Managed Care) | Source: Ambulatory Visit | Attending: Adult Health | Admitting: Adult Health

## 2023-08-19 DIAGNOSIS — Z170 Estrogen receptor positive status [ER+]: Secondary | ICD-10-CM | POA: Insufficient documentation

## 2023-08-19 DIAGNOSIS — C50411 Malignant neoplasm of upper-outer quadrant of right female breast: Secondary | ICD-10-CM | POA: Insufficient documentation

## 2023-08-23 ENCOUNTER — Ambulatory Visit (HOSPITAL_COMMUNITY): Payer: Medicare (Managed Care)

## 2023-08-23 NOTE — Progress Notes (Deleted)
Palliative Medicine [facility]  [phone] [phone]   Name: Kirsten Mcmahon Date: 08/23/2023 MRN: [id number]  DOB: 03-17-1946  Patient Care Team: [facility] as PCP - Chari Manning, [facility] Pershing Proud, RN as Oncology Nurse Navigator Donnelly Angelica, RN as Oncology Nurse Navigator Serena Croissant, MD as Consulting Physician (Hematology and Oncology)    REASON FOR CONSULTATION: Kirsten Mcmahon is a 77 y.o. female with oncologic medical history including malignant neoplasm of breast, estrogen receptor positive (01/2017) as well as vulvar squamous cell carcinoma (01/2017).  Palliative ask to see for symptom management and goals of care.    SOCIAL HISTORY:     reports that she has never smoked. She has never used smokeless tobacco. She reports that she does not drink alcohol and does not use drugs.  ADVANCE DIRECTIVES:  None on file  CODE STATUS: DNR  PAST MEDICAL HISTORY: Past Medical History:  Diagnosis Date   Cancer (HCC) 2017   Right breast   Chronic kidney disease    progression to ESRD 04/10/21   GERD (gastroesophageal reflux disease)    Glaucoma    Hemiparesis (HCC)    left side   High cholesterol    History of seizure    after a spider bite; 07/15/21   History of stroke with residual deficit    left-side weakness   Hypertension    states BP under control with meds., has been on med. x 2 yr.   Hypothyroidism    Non-insulin dependent type 2 diabetes mellitus (HCC)    Overactive bladder    PFO (patent foramen ovale) 05/15/2021   Stroke (HCC)    1998 weakness on left side; 05/12/21    PAST SURGICAL HISTORY:  Past Surgical History:  Procedure Laterality Date   ABDOMINAL HYSTERECTOMY     complete   AV FISTULA PLACEMENT Right 09/29/2021   Procedure: INSERTION OF RIGHT ARM ARTERIOVENOUS (AV) GORE-TEX GRAFT;  Surgeon: Chuck Hint, MD;  Location: [facility] OR;  Service:  Vascular;  Laterality: Right;   BREAST BIOPSY Left 08/03/2018   Benign adipose tissue   BREAST BIOPSY Right 02/15/2023   [place] RT BREAST BX W LOC DEV 1ST LESION IMG BX SPEC US GUIDE 02/15/2023 [facility]   BREAST BIOPSY Left 02/15/2023   [place] LT BREAST BX W LOC DEV 1ST LESION IMG BX SPEC US GUIDE 02/15/2023 [facility]   BREAST EXCISIONAL BIOPSY Right 2014   Positive   BREAST LUMPECTOMY Right    BUBBLE STUDY  05/15/2021   Procedure: BUBBLE STUDY;  Surgeon: Jake Bathe, MD;  Location: [facility] ENDOSCOPY;  Service: Cardiovascular;;   CATARACT EXTRACTION W/ INTRAOCULAR LENS IMPLANT Left    CEREBRAL ANEURYSM REPAIR  1998   DIALYSIS/PERMA CATHETER INSERTION N/A 04/10/2021   Procedure: DIALYSIS/PERMA CATHETER INSERTION;  Surgeon: Annice Needy, MD;  Location: [facility] INVASIVE CV LAB;  Service: Cardiovascular;  Laterality: N/A;   IR FLUORO GUIDE CV LINE RIGHT  04/30/2021   PICC LINE INSERTION     TEE WITHOUT CARDIOVERSION N/A 05/15/2021   Procedure: TRANSESOPHAGEAL ECHOCARDIOGRAM (TEE);  Surgeon: Jake Bathe, MD;  Location: [facility] ENDOSCOPY;  Service: Cardiovascular;  Laterality: N/A;   THYROID LOBECTOMY Right 07/15/2017   Procedure: RIGHT THYROID LOBECTOMY;  Surgeon: Darnell Level, MD;  Location: [facility] OR;  Service: General;  Laterality: Right;    HEMATOLOGY/ONCOLOGY HISTORY:  Oncology History  Malignant neoplasm of upper-outer quadrant of right breast in female, estrogen  receptor positive (HCC)  03/14/2015 Initial Diagnosis   On workup for blood work cancer PET/CT showed right breast lesion, 11:00 position 2.6 cm diameter biopsy done 05/20/2015 IDC grade 2 ER 95% PR 80% HER-2 negative   07/14/2015 Surgery   Right lumpectomy: IDC grade 2, 2.5 cm, 1/2 sentinel nodes positive, DCIS, LCIS, lateral inferior and deep margins positive for LCIS, T2 N1 M0 stage II a, radiation did not recommend adjuvant radiation ( at [facility])   08/21/2015 -  Anti-estrogen oral therapy   Letrozole 2.5 mg daily    02/15/2023 Relapse/Recurrence   Right breast nipple changes, new heterogeneous calcifications, new asymmetry right retroareolar region 2 cm area, right axilla: 1 abnormal lymph node, ill-defined hypoechoic area left breast 5.1 cm diffuse skin thickening left breast: Right breast biopsy: Grade 2 IDC ER 95%, PR 95%, Ki-67 5%, HER2 1+ negative; left breast biopsy: Benign   Primary vulvar squamous cell carcinoma (HCC)  03/25/2015 Surgery   Vulvectomy by Dr. Tresa Endo at [facility]; invasive squamous cell carcinoma margins are negative, did not require any further treatment     ALLERGIES:  is allergic to contrast media [iodinated contrast media], latex, shellfish-derived products, and levemir [insulin detemir].  MEDICATIONS:  Current Outpatient Medications  Medication Sig Dispense Refill   acetaminophen (TYLENOL) 500 MG tablet Take 1,000 mg by mouth daily.     anastrozole (ARIMIDEX) 1 MG tablet Take 1 tablet (1 mg total) by mouth daily. 90 tablet 3   aspirin 81 MG chewable tablet Chew 1 tablet (81 mg total) by mouth daily. (Patient not taking: Reported on 07/29/2023) 60 tablet 0   bisacodyl (DULCOLAX) 5 MG EC tablet Take 1 tablet (5 mg total) by mouth daily as needed for moderate constipation. (Patient not taking: Reported on 07/29/2023) 30 tablet 0   calcitRIOL (ROCALTROL) 0.5 MCG capsule Take 1 capsule (0.5 mcg total) by mouth every Monday, Wednesday, and Friday with hemodialysis. 30 capsule 0   chlorhexidine gluconate, MEDLINE KIT, (PERIDEX) 0.12 % solution 15 mLs by Mouth Rinse route 2 (two) times daily. 120 mL 0   dextrose (GLUTOSE) 40 % GEL Place 28 mLs inside cheek as needed (for suspected low sugars). 37.5 g 2   diazePAM, 15 MG Dose, (VALTOCO 15 MG DOSE) 2 x 7.5 MG/0.1ML LQPK Place 15 mg into the nose once as needed for up to 1 dose (for seizure lasting more than 2 minutes.). 2 each 0   docusate sodium (COLACE) 100 MG capsule Take 1 capsule (100 mg total) by mouth 2 (two) times daily. 20 capsule 0    donepezil (ARICEPT) 10 MG tablet Take 10 mg by mouth daily.     famotidine (PEPCID) 10 MG tablet Take 10 mg by mouth daily.     lacosamide 150 MG TABS Take 1 tablet (150 mg total) by mouth 2 (two) times daily. 60 tablet 0   latanoprost (XALATAN) 0.005 % ophthalmic solution Place 1 drop into both eyes at bedtime. 2.5 mL 0   levothyroxine (SYNTHROID, LEVOTHROID) 112 MCG tablet Take 1 tablet (112 mcg total) by mouth daily before breakfast. 90 tablet 0   lidocaine-prilocaine (EMLA) cream Apply 1 application. topically 3 (three) times a week.     metoprolol succinate (TOPROL-XL) 25 MG 24 hr tablet Take 1 tablet (25 mg total) by mouth daily. 30 tablet 0   multivitamin (RENA-VIT) TABS tablet Take 1 tablet by mouth at bedtime. (Patient not taking: Reported on 05/31/2023) 30 tablet 0   Nutritional Supplements (,FEEDING SUPPLEMENT, PROSOURCE PLUS)  liquid Take 30 mLs by mouth 3 (three) times daily between meals. 1000 mL 0   sucroferric oxyhydroxide (VELPHORO) 500 MG chewable tablet Chew 500 mg by mouth 3 (three) times daily with meals.     No current facility-administered medications for this visit.    VITAL SIGNS: There were no vitals taken for this visit. There were no vitals filed for this visit.  Estimated body mass index is 24.56 kg/m as calculated from the following:   Height as of 05/31/23: 5\' 2"  (1.575 m).   Weight as of 06/06/23: 134 lb 4.2 oz (60.9 kg).  LABS: CBC:    Component Value Date/Time   WBC 8.2 06/06/2023 0917   HGB 9.7 (L) 06/06/2023 0917   HCT 29.8 (L) 06/06/2023 0917   PLT 251 06/06/2023 0917   MCV 98.7 06/06/2023 0917   NEUTROABS 9.0 (H) 05/30/2023 1524   LYMPHSABS 1.4 05/30/2023 1524   MONOABS 0.5 05/30/2023 1524   EOSABS 0.1 05/30/2023 1524   BASOSABS 0.0 05/30/2023 1524   Comprehensive Metabolic Panel:    Component Value Date/Time   NA 128 (L) 06/06/2023 0918   K 5.1 06/06/2023 0918   CL 90 (L) 06/06/2023 0918   CO2 26 06/06/2023 0918   BUN 37 (H) 06/06/2023  0918   CREATININE 8.23 (H) 06/06/2023 0918   GLUCOSE 95 06/06/2023 0918   CALCIUM 9.1 06/06/2023 0918   AST 25 05/25/2023 1732   ALT 18 05/25/2023 1732   ALKPHOS 73 05/25/2023 1732   BILITOT 0.4 05/25/2023 1732   PROT 8.0 05/25/2023 1732   ALBUMIN 3.0 (L) 06/06/2023 0918    RADIOGRAPHIC STUDIES: CT Chest Wo Contrast  Result Date: 08/22/2023 CLINICAL DATA:  Malignant neoplasm of upper-outer quadrant of right breast. Staging prior to systemic therapy. * Tracking Code: BO * EXAM: CT CHEST, ABDOMEN AND PELVIS WITHOUT CONTRAST TECHNIQUE: Multidetector CT imaging of the chest, abdomen and pelvis was performed following the standard protocol without IV contrast. RADIATION DOSE REDUCTION: This exam was performed according to the departmental dose-optimization program which includes automated exposure control, adjustment of the mA and/or kV according to patient size and/or use of iterative reconstruction technique. COMPARISON:  06/23/2022 abdominopelvic CT.  03/08/2022 chest CT. FINDINGS: CT CHEST FINDINGS Cardiovascular: Mild degradation secondary to left arm position (not raised above the head) and lack of IV contrast. Aortic atherosclerosis. Tortuous thoracic aorta. Moderate cardiomegaly, without pericardial effusion. Three vessel coronary artery calcification. Mediastinum/Nodes: Right thyroidectomy. No supraclavicular adenopathy. No subpectoral adenopathy. No axillary adenopathy. No mediastinal or hilar adenopathy, given limitations of unenhanced CT. Lungs/Pleura: No pleural fluid.  Bibasilar scarring. Musculoskeletal: Surgical or biopsy clips in the left breast. Left breast and skin thickening are nonspecific. Central right sided lumpectomy. Soft tissue fullness in the region of the nipple including on 36/2 is new since 03/08/2022. More lateral right breast nodularity including at 1.0 cm on 38/2, nonspecific. No acute osseous abnormality. CT ABDOMEN PELVIS FINDINGS Hepatobiliary: Normal noncontrast  appearance of the liver. Stone filled gallbladder without acute cholecystitis or biliary duct dilatation. Pancreas: Pancreatic tail 1.4 cm cystic lesion on 44/2 is similar to on the prior. No duct dilatation or acute inflammation. Spleen: Normal in size, without focal abnormality. Adrenals/Urinary Tract: Bilateral adrenal thickening with maintenance of adreniform shape. Moderate bilateral renal cortical thinning. punctate left renal collecting system calculi. No hydronephrosis. No bladder stones. Stomach/Bowel: Normal stomach, without wall thickening. Scattered colonic diverticula. Colonic stool burden suggests constipation. Normal terminal ileum and appendix. Normal small bowel. Vascular/Lymphatic: Aortic atherosclerosis. No abdominopelvic  adenopathy. Reproductive: Hysterectomy.  No adnexal mass. Other: No significant free fluid. Moderate pelvic floor laxity. No free intraperitoneal air. No evidence of omental or peritoneal disease. Musculoskeletal: Osteopenia. Lumbosacral spondylosis. Convex left thoracolumbar spine curvature. IMPRESSION: 1. Mild limitations secondary to lack of IV contrast and patient left arm position, not raised above the head. 2. Increased soft tissue density in the periareolar right breast likely represents the site of recurrent/progressive disease. Nonspecific nodularity within the more lateral right breast. 3. No typical findings of metastatic disease in the chest, abdomen, or pelvis. 4. Incidental findings, including: Coronary artery atherosclerosis. Aortic Atherosclerosis (ICD10-I70.0). Bilateral renal atrophy. Left nephrolithiasis. Cholelithiasis. Possible constipation. 5. Pancreatic tail cystic lesion of 1.4 cm is similar to 06/23/2022 and of doubtful clinical significance given comorbidities. Most likely a pseudocyst or indolent cystic neoplasm. Recommend attention on follow-up. Electronically Signed   By: Jeronimo Greaves M.D.   On: 08/22/2023 12:31   CT ABDOMEN PELVIS WO  CONTRAST  Result Date: 08/22/2023 CLINICAL DATA:  Malignant neoplasm of upper-outer quadrant of right breast. Staging prior to systemic therapy. * Tracking Code: BO * EXAM: CT CHEST, ABDOMEN AND PELVIS WITHOUT CONTRAST TECHNIQUE: Multidetector CT imaging of the chest, abdomen and pelvis was performed following the standard protocol without IV contrast. RADIATION DOSE REDUCTION: This exam was performed according to the departmental dose-optimization program which includes automated exposure control, adjustment of the mA and/or kV according to patient size and/or use of iterative reconstruction technique. COMPARISON:  06/23/2022 abdominopelvic CT.  03/08/2022 chest CT. FINDINGS: CT CHEST FINDINGS Cardiovascular: Mild degradation secondary to left arm position (not raised above the head) and lack of IV contrast. Aortic atherosclerosis. Tortuous thoracic aorta. Moderate cardiomegaly, without pericardial effusion. Three vessel coronary artery calcification. Mediastinum/Nodes: Right thyroidectomy. No supraclavicular adenopathy. No subpectoral adenopathy. No axillary adenopathy. No mediastinal or hilar adenopathy, given limitations of unenhanced CT. Lungs/Pleura: No pleural fluid.  Bibasilar scarring. Musculoskeletal: Surgical or biopsy clips in the left breast. Left breast and skin thickening are nonspecific. Central right sided lumpectomy. Soft tissue fullness in the region of the nipple including on 36/2 is new since 03/08/2022. More lateral right breast nodularity including at 1.0 cm on 38/2, nonspecific. No acute osseous abnormality. CT ABDOMEN PELVIS FINDINGS Hepatobiliary: Normal noncontrast appearance of the liver. Stone filled gallbladder without acute cholecystitis or biliary duct dilatation. Pancreas: Pancreatic tail 1.4 cm cystic lesion on 44/2 is similar to on the prior. No duct dilatation or acute inflammation. Spleen: Normal in size, without focal abnormality. Adrenals/Urinary Tract: Bilateral adrenal  thickening with maintenance of adreniform shape. Moderate bilateral renal cortical thinning. punctate left renal collecting system calculi. No hydronephrosis. No bladder stones. Stomach/Bowel: Normal stomach, without wall thickening. Scattered colonic diverticula. Colonic stool burden suggests constipation. Normal terminal ileum and appendix. Normal small bowel. Vascular/Lymphatic: Aortic atherosclerosis. No abdominopelvic adenopathy. Reproductive: Hysterectomy.  No adnexal mass. Other: No significant free fluid. Moderate pelvic floor laxity. No free intraperitoneal air. No evidence of omental or peritoneal disease. Musculoskeletal: Osteopenia. Lumbosacral spondylosis. Convex left thoracolumbar spine curvature. IMPRESSION: 1. Mild limitations secondary to lack of IV contrast and patient left arm position, not raised above the head. 2. Increased soft tissue density in the periareolar right breast likely represents the site of recurrent/progressive disease. Nonspecific nodularity within the more lateral right breast. 3. No typical findings of metastatic disease in the chest, abdomen, or pelvis. 4. Incidental findings, including: Coronary artery atherosclerosis. Aortic Atherosclerosis (ICD10-I70.0). Bilateral renal atrophy. Left nephrolithiasis. Cholelithiasis. Possible constipation. 5. Pancreatic tail cystic lesion of 1.4  cm is similar to 06/23/2022 and of doubtful clinical significance given comorbidities. Most likely a pseudocyst or indolent cystic neoplasm. Recommend attention on follow-up. Electronically Signed   By: Jeronimo Greaves M.D.   On: 08/22/2023 12:31    PERFORMANCE STATUS (ECOG) : {CHL ONC ECOG ZO:[id number]}  Review of Systems Unless otherwise noted, a complete review of systems is negative.  Physical Exam General: NAD Cardiovascular: regular rate and rhythm Pulmonary: clear ant fields Abdomen: soft, nontender, + bowel sounds Extremities: no edema, no joint deformities Skin: no  rashes Neurological:  IMPRESSION: *** I introduced myself, Makenzie Vittorio RN, and Palliative's role in collaboration with the oncology team. Concept of Palliative Care was introduced as specialized medical care for people and their families living with serious illness.  It focuses on providing relief from the symptoms and stress of a serious illness.  The goal is to improve quality of life for both the patient and the family. Values and goals of care important to patient and family were attempted to be elicited.    We discussed *** current illness and what it means in the larger context of *** on-going co-morbidities. Natural disease trajectory and expectations were discussed.  I discussed the importance of continued conversation with family and their medical providers regarding overall plan of care and treatment options, ensuring decisions are within the context of the patients values and GOCs.  PLAN: Established therapeutic relationship. Education provided on palliative's role in collaboration with their Oncology/Radiation team. I will plan to see patient back in 2-4 weeks in collaboration to other oncology appointments.    Patient expressed understanding and was in agreement with this plan. She also understands that She can call the clinic at any time with any questions, concerns, or complaints.   Thank you for your referral and allowing Palliative to assist in Mrs. Costella Hatcher Casady's care.   Number and complexity of problems addressed: ***HIGH - 1 or more chronic illnesses with SEVERE exacerbation, progression, or side effects of treatment - advanced cancer, pain. Any controlled substances utilized were prescribed in the context of palliative care.   Visit consisted of counseling and education dealing with the complex and emotionally intense issues of symptom management and palliative care in the setting of serious and potentially life-threatening illness.Greater than 50%  of this time was spent  counseling and coordinating care related to the above assessment and plan.  Signed by: Willette Alma, AGPCNP-BC Palliative Medicine Team/[facility]   *Please note that this is a verbal dictation therefore any spelling or grammatical errors are due to the "[employer] One" system interpretation.

## 2023-08-24 ENCOUNTER — Inpatient Hospital Stay (HOSPITAL_BASED_OUTPATIENT_CLINIC_OR_DEPARTMENT_OTHER): Payer: Medicare (Managed Care) | Attending: Adult Health | Admitting: Hematology and Oncology

## 2023-08-24 VITALS — BP 141/63 | HR 74 | Temp 97.8°F | Resp 18 | Ht 62.0 in | Wt 130.0 lb

## 2023-08-24 DIAGNOSIS — N6489 Other specified disorders of breast: Secondary | ICD-10-CM | POA: Diagnosis not present

## 2023-08-24 DIAGNOSIS — Z79811 Long term (current) use of aromatase inhibitors: Secondary | ICD-10-CM | POA: Insufficient documentation

## 2023-08-24 DIAGNOSIS — Z91041 Radiographic dye allergy status: Secondary | ICD-10-CM | POA: Diagnosis not present

## 2023-08-24 DIAGNOSIS — Z170 Estrogen receptor positive status [ER+]: Secondary | ICD-10-CM

## 2023-08-24 DIAGNOSIS — C50411 Malignant neoplasm of upper-outer quadrant of right female breast: Secondary | ICD-10-CM

## 2023-08-24 DIAGNOSIS — Z79899 Other long term (current) drug therapy: Secondary | ICD-10-CM | POA: Diagnosis not present

## 2023-08-24 NOTE — Progress Notes (Signed)
Patient Care Team: [facility] as PCP - General Berenice Primas, [facility] Pershing Proud, RN as Oncology Nurse Navigator Donnelly Angelica, RN as Oncology Nurse Navigator Serena Croissant, MD as Consulting Physician (Hematology and Oncology)  DIAGNOSIS:  Encounter Diagnosis  Name Primary?   Malignant neoplasm of upper-outer quadrant of right breast in female, estrogen receptor positive (HCC) Yes    SUMMARY OF ONCOLOGIC HISTORY: Oncology History  Malignant neoplasm of upper-outer quadrant of right breast in female, estrogen receptor positive (HCC)  03/14/2015 Initial Diagnosis   On workup for blood work cancer PET/CT showed right breast lesion, 11:00 position 2.6 cm diameter biopsy done 05/20/2015 IDC grade 2 ER 95% PR 80% HER-2 negative   07/14/2015 Surgery   Right lumpectomy: IDC grade 2, 2.5 cm, 1/2 sentinel nodes positive, DCIS, LCIS, lateral inferior and deep margins positive for LCIS, T2 N1 M0 stage II a, radiation did not recommend adjuvant radiation ( at [facility])   08/21/2015 -  Anti-estrogen oral therapy   Letrozole 2.5 mg daily   02/15/2023 Relapse/Recurrence   Right breast nipple changes, new heterogeneous calcifications, new asymmetry right retroareolar region 2 cm area, right axilla: 1 abnormal lymph node, ill-defined hypoechoic area left breast 5.1 cm diffuse skin thickening left breast: Right breast biopsy: Grade 2 IDC ER 95%, PR 95%, Ki-67 5%, HER2 1+ negative; left breast biopsy: Benign   Primary vulvar squamous cell carcinoma (HCC)  03/25/2015 Surgery   Vulvectomy by Dr. Tresa Endo at [facility]; invasive squamous cell carcinoma margins are negative, did not require any further treatment     CHIEF COMPLIANT: Follow-up to discuss the results of the scans  Discussed the use of AI scribe software for clinical note transcription with the patient, who gave verbal consent to proceed.  History of Present Illness   The patient, with a known  diagnosis of breast cancer, has been on medication for the management of her condition. Despite the treatment, she reports that the size of her cancer has been increasing. The patient's family member confirms that the cancer was small about five months ago but has since grown. The patient has not reported any new symptoms or side effects from the medication. The patient's family member is actively involved in her care and is present during the consultation.         ALLERGIES:  is allergic to contrast media [iodinated contrast media], latex, shellfish-derived products, and levemir [insulin detemir].  MEDICATIONS:  Current Outpatient Medications  Medication Sig Dispense Refill   acetaminophen (TYLENOL) 500 MG tablet Take 1,000 mg by mouth daily.     anastrozole (ARIMIDEX) 1 MG tablet Take 1 tablet (1 mg total) by mouth daily. 90 tablet 3   aspirin 81 MG chewable tablet Chew 1 tablet (81 mg total) by mouth daily. (Patient not taking: Reported on 07/29/2023) 60 tablet 0   bisacodyl (DULCOLAX) 5 MG EC tablet Take 1 tablet (5 mg total) by mouth daily as needed for moderate constipation. (Patient not taking: Reported on 07/29/2023) 30 tablet 0   calcitRIOL (ROCALTROL) 0.5 MCG capsule Take 1 capsule (0.5 mcg total) by mouth every Monday, Wednesday, and Friday with hemodialysis. 30 capsule 0   chlorhexidine gluconate, MEDLINE KIT, (PERIDEX) 0.12 % solution 15 mLs by Mouth Rinse route 2 (two) times daily. 120 mL 0   dextrose (GLUTOSE) 40 % GEL Place 28 mLs inside cheek as needed (for suspected low sugars). 37.5 g 2   diazePAM, 15 MG Dose, (VALTOCO 15 MG  DOSE) 2 x 7.5 MG/0.1ML LQPK Place 15 mg into the nose once as needed for up to 1 dose (for seizure lasting more than 2 minutes.). 2 each 0   docusate sodium (COLACE) 100 MG capsule Take 1 capsule (100 mg total) by mouth 2 (two) times daily. 20 capsule 0   donepezil (ARICEPT) 10 MG tablet Take 10 mg by mouth daily.     famotidine (PEPCID) 10 MG tablet Take 10  mg by mouth daily.     lacosamide 150 MG TABS Take 1 tablet (150 mg total) by mouth 2 (two) times daily. 60 tablet 0   latanoprost (XALATAN) 0.005 % ophthalmic solution Place 1 drop into both eyes at bedtime. 2.5 mL 0   levothyroxine (SYNTHROID, LEVOTHROID) 112 MCG tablet Take 1 tablet (112 mcg total) by mouth daily before breakfast. 90 tablet 0   lidocaine-prilocaine (EMLA) cream Apply 1 application. topically 3 (three) times a week.     metoprolol succinate (TOPROL-XL) 25 MG 24 hr tablet Take 1 tablet (25 mg total) by mouth daily. 30 tablet 0   multivitamin (RENA-VIT) TABS tablet Take 1 tablet by mouth at bedtime. (Patient not taking: Reported on 05/31/2023) 30 tablet 0   Nutritional Supplements (,FEEDING SUPPLEMENT, PROSOURCE PLUS) liquid Take 30 mLs by mouth 3 (three) times daily between meals. 1000 mL 0   sucroferric oxyhydroxide (VELPHORO) 500 MG chewable tablet Chew 500 mg by mouth 3 (three) times daily with meals.     No current facility-administered medications for this visit.    PHYSICAL EXAMINATION: ECOG PERFORMANCE STATUS: 1 - Symptomatic but completely ambulatory  Vitals:   08/24/23 1205  BP: (!) 141/63  Pulse: 74  Resp: 18  Temp: 97.8 F (36.6 C)  SpO2: 97%   Filed Weights   08/24/23 1205  Weight: 130 lb (59 kg)      LABORATORY DATA:  I have reviewed the data as listed    Latest Ref Rng & Units 06/06/2023    9:18 AM 06/04/2023    3:58 AM 06/03/2023    6:54 AM  CMP  Glucose 70 - 99 mg/dL 95  88  914   BUN 8 - 23 mg/dL 37  15  29   Creatinine 0.44 - 1.00 mg/dL 7.82  9.56  2.13   Sodium 135 - 145 mmol/L 128  131  133   Potassium 3.5 - 5.1 mmol/L 5.1  4.2  4.0   Chloride 98 - 111 mmol/L 90  94  92   CO2 22 - 32 mmol/L 26  28  28    Calcium 8.9 - 10.3 mg/dL 9.1  8.4  8.2     Lab Results  Component Value Date   WBC 8.2 06/06/2023   HGB 9.7 (L) 06/06/2023   HCT 29.8 (L) 06/06/2023   MCV 98.7 06/06/2023   PLT 251 06/06/2023   NEUTROABS 9.0 (H) 05/30/2023     ASSESSMENT & PLAN:  Malignant neoplasm of upper-outer quadrant of right breast in female, estrogen receptor positive (HCC) 02/15/23: Right breast nipple changes, new heterogeneous calcifications, new asymmetry right retroareolar region 2 cm area, right axilla: 1 abnormal lymph node, ill-defined hypoechoic area left breast 5.1 cm diffuse skin thickening left breast: Right breast biopsy: Grade 2 IDC ER 95%, PR 95%, Ki-67 5%, HER2 1+ negative; left breast biopsy: Benign   (07/14/2015: Right lumpectomy: IDC grade 2, 2.5 cm, 1/2 sentinel nodes positive, DCIS, LCIS, lateral inferior and deep margins positive for LCIS, T2 N1 M0 stage II a, radiation  did not recommend adjuvant radiation ( at [facility]) followed by antiestrogen therapy with letrozole 08/21/19  Neoadj Anastrozole 07/21/2023: mamm and U/S: Increase in size of cancer to 4 cm, new adj mass 1 cm, 2 cm IM LN 08/22/23: CT CAP: No evidence of metastatic disease  Patient will be referred back to Dr. Carolynne Edouard for further evaluation (previously she was felt to be high risk for surgery)  ------------------------------------- Assessment and Plan    Breast Cancer Localized disease with growth despite current treatment. The tumor has increased from 2cm to 3.5cm over 5 months and a new 1cm lesion has been identified. No evidence of metastatic disease on recent scans. The current treatment plan is not effective and the risk of eventual spread is concerning. -Continue current medication until surgical intervention. -Refer to Dr. Carolynne Edouard for surgical evaluation and potential mastectomy. -Consider cardiology consultation to assess cardiac risk for surgery. -Post-surgical follow-up to assess recovery and further treatment plans.          No orders of the defined types were placed in this encounter.  The patient has a good understanding of the overall plan. she agrees with it. she will call with any problems that may develop before the next visit  here. Total time spent: 30 mins including face to face time and time spent for planning, charting and co-ordination of care   Tamsen Meek, MD 08/24/23

## 2023-08-24 NOTE — Assessment & Plan Note (Signed)
02/15/23: Right breast nipple changes, new heterogeneous calcifications, new asymmetry right retroareolar region 2 cm area, right axilla: 1 abnormal lymph node, ill-defined hypoechoic area left breast 5.1 cm diffuse skin thickening left breast: Right breast biopsy: Grade 2 IDC ER 95%, PR 95%, Ki-67 5%, HER2 1+ negative; left breast biopsy: Benign   (07/14/2015: Right lumpectomy: IDC grade 2, 2.5 cm, 1/2 sentinel nodes positive, DCIS, LCIS, lateral inferior and deep margins positive for LCIS, T2 N1 M0 stage II a, radiation did not recommend adjuvant radiation ( at [facility]) followed by antiestrogen therapy with letrozole 08/21/19  Neoadj Anastrozole 07/21/2023: mamm and U/S: Increase in size of cancer to 4 cm, new adj mass 1 cm, 2 cm IM LN 08/22/23: CT CAP: No evidence of metastatic disease  Patient will be referred back to Dr. Carolynne Edouard for further evaluation (previously she was felt to be high risk for surgery)

## 2023-08-25 ENCOUNTER — Inpatient Hospital Stay: Payer: Medicare (Managed Care) | Admitting: Nurse Practitioner

## 2023-09-12 ENCOUNTER — Ambulatory Visit: Payer: Self-pay | Admitting: General Surgery

## 2023-09-13 ENCOUNTER — Telehealth: Payer: Self-pay | Admitting: Hematology and Oncology

## 2023-09-13 NOTE — Progress Notes (Unsigned)
Cardiology Office Note:   Date:  09/14/2023  ID:  Kirsten Mcmahon, DOB Apr 21, 1946, MRN [id number] PCP:  [facility]  [facility] Providers Cardiologist:  Alverda Skeans, MD Referring MD: [facility]*  Chief Complaint/Reason for Referral: Preoperative cardiovascular examination ASSESSMENT:    1. Preoperative cardiovascular examination   2. Malignant neoplasm of upper-outer quadrant of right breast in female, estrogen receptor positive (HCC)   3. Hyperlipidemia LDL goal <70   4. Primary hypertension   5. History of CVA (cerebrovascular accident)   6. Aortic atherosclerosis (HCC)     PLAN:   In order of problems listed above: Preoperative cardiovascular examination: Will obtain PET stress test and echocardiogram to evaluate further.  Only if she has high risk findings would any further evaluation be required.  She has a very low functional capacity and as multiple important comorbidities that will need to be weighed in regards to her management. Malignant breast cancer: Recurrent; being followed by other providers with planned surgical resection in the near future. Hyperlipidemia: See #3 above. Hypertension: Blood pressures are on the low however the patient just had dialysis.  Monitor for now. History of stroke: Continue aspirin. Aortic atherosclerosis: Would likely benefit from statin but will defer for now given advanced age and oncologic issues.        Informed Consent   Shared Decision Making/Informed Consent{ All outpatient stress tests require an informed consent ([id number]) ATTESTATION ORDER       :[id number]} The risks [chest pain, shortness of breath, cardiac arrhythmias, dizziness, blood pressure fluctuations, myocardial infarction, stroke/transient ischemic attack, nausea, vomiting, allergic reaction, radiation exposure, metallic taste sensation and life-threatening complications (estimated to be 1 in 10,000)], benefits (risk stratification,  diagnosing coronary artery disease, treatment guidance) and alternatives of a cardiac PET stress test were discussed in detail with [name] and she agrees to proceed.      Dispo:  Return in about 6 months (around 03/14/2024).      Medication Adjustments/Labs and Tests Ordered: Current medicines are reviewed at length with the patient today.  Concerns regarding medicines are outlined above.  The following changes have been made:  no change   Labs/tests ordered: No orders of the defined types were placed in this encounter.   Medication Changes: No orders of the defined types were placed in this encounter.   Current medicines are reviewed at length with the patient today.  The patient does not have concerns regarding medicines.  History of Present Illness:      FOCUSED PROBLEM LIST:   Breast cancer diagnosed 2016 Status postlumpectomy in 2016 and therapy with letrozole Recurrence 2024 Strep G bacteremia 2022 End-stage renal disease Hypertension Hyperlipidemia Multiple previous strokes with residual left hemiparesis Right MCA stroke 1998 Multifocal punctate strokes 2022 >>TEE negative  Seizure disorder Aortic atherosclerosis and three-vessel coronary artery calcification chest CT 2024 BMI 23 Frailty  The 77 year old patient has had a long complicated medical history summarized above.  Most recently she was diagnosed with recurrent breast cancer and is here for preoperative cardiac evaluation prior to noncardiac surgery.  The patient is here with her family.  She just finished dialysis.  Dialysis has been going well.  She has been on dialysis for about 3 years.  The patient is very limited.  She goes to a physical therapy program every day.  She does not really ambulate well on her own.  She has residual left upper extremity weakness.  Her family tells me she could walk better  if she had a cane.  Most the time at home she sits and does not do much.  She does not really do  anything around the house.  She fortunately denies any significant shortness of breath or chest pain with any of the low-level activity that she does do.  She tells me she can go up and down stairs but this takes quite a while.  She denies any presyncope or syncope.  She denies any problems with recurrent stroke or issues with dialysis.  She has had no severe bleeding or bruising.          Current Medications: Current Meds  Medication Sig   acetaminophen (TYLENOL) 500 MG tablet Take 1,000 mg by mouth daily.   aspirin 81 MG chewable tablet Chew 1 tablet (81 mg total) by mouth daily.   calcitRIOL (ROCALTROL) 0.5 MCG capsule Take 1 capsule (0.5 mcg total) by mouth every Monday, Wednesday, and Friday with hemodialysis.   Cinacalcet HCl (SENSIPAR PO) Take by mouth.   dextrose (GLUTOSE) 40 % GEL Place 28 mLs inside cheek as needed (for suspected low sugars).   diazePAM, 15 MG Dose, (VALTOCO 15 MG DOSE) 2 x 7.5 MG/0.1ML LQPK Place 15 mg into the nose once as needed for up to 1 dose (for seizure lasting more than 2 minutes.).   lacosamide 150 MG TABS Take 1 tablet (150 mg total) by mouth 2 (two) times daily.   latanoprost (XALATAN) 0.005 % ophthalmic solution Place 1 drop into both eyes at bedtime.   levothyroxine (SYNTHROID, LEVOTHROID) 112 MCG tablet Take 1 tablet (112 mcg total) by mouth daily before breakfast.   lidocaine-prilocaine (EMLA) cream Apply 1 application. topically 3 (three) times a week.   Methoxy PEG-Epoetin Beta (MIRCERA IJ) Mircera   metoprolol succinate (TOPROL-XL) 25 MG 24 hr tablet Take 1 tablet (25 mg total) by mouth daily.   multivitamin (RENA-VIT) TABS tablet Take 1 tablet by mouth at bedtime.     Review of Systems:   Please see the history of present illness.    All other systems reviewed and are negative.     EKGs/Labs/Other Test Reviewed:   EKG: EKG performed June 2024 demonstrates sinus rhythm with anterior infarction pattern  EKG Interpretation Date/Time:     Ventricular Rate:    PR Interval:    QRS Duration:    QT Interval:    QTC Calculation:   R Axis:      Text Interpretation:           Risk Assessment/Calculations:          Physical Exam:   VS:  BP 96/60   Pulse 87   Ht 5\' 2"  (1.575 m)   Wt 128 lb (58.1 kg)   SpO2 95%   BMI 23.41 kg/m        Wt Readings from Last 3 Encounters:  09/14/23 128 lb (58.1 kg)  08/24/23 130 lb (59 kg)  06/06/23 134 lb 4.2 oz (60.9 kg)      GENERAL:  No apparent distress, AOx3 HEENT:  No carotid bruits, +2 carotid impulses, no scleral icterus CAR: RRR no murmurs, gallops, rubs, or thrills RES:  Clear to auscultation bilaterally ABD:  Soft, nontender, nondistended, positive bowel sounds x 4 VASC:  +2 radial pulses, +2 carotid pulses NEURO:  CN 2-12 grossly intact; motor and sensory grossly intact PSYCH:  No active depression or anxiety EXT:  No edema, ecchymosis, or cyanosis: Right upper extremity fistula  Signed, Orbie Pyo, MD  09/14/2023 4:44 PM    [facility] [address] [place], [place], [place]  [address] Phone: [phone]; Fax: [phone]   Note:  This document was prepared using Dragon voice recognition software and may include unintentional dictation errors.

## 2023-09-13 NOTE — Telephone Encounter (Signed)
09/13/23; Due to a change in the provider schedule, I called the patients son to reschedule his mothers appointment. I cancelled the appointment because Dr. Earmon Phoenix note said to see Post surgery and the patient has not been scheduled for surgery yet.

## 2023-09-14 ENCOUNTER — Ambulatory Visit: Payer: Medicare (Managed Care) | Attending: Internal Medicine | Admitting: Internal Medicine

## 2023-09-14 ENCOUNTER — Encounter: Payer: Self-pay | Admitting: Internal Medicine

## 2023-09-14 ENCOUNTER — Encounter: Payer: Self-pay | Admitting: *Deleted

## 2023-09-14 VITALS — BP 96/60 | HR 87 | Ht 62.0 in | Wt 128.0 lb

## 2023-09-14 DIAGNOSIS — I10 Essential (primary) hypertension: Secondary | ICD-10-CM

## 2023-09-14 DIAGNOSIS — Z01810 Encounter for preprocedural cardiovascular examination: Secondary | ICD-10-CM

## 2023-09-14 DIAGNOSIS — E1169 Type 2 diabetes mellitus with other specified complication: Secondary | ICD-10-CM

## 2023-09-14 DIAGNOSIS — E1122 Type 2 diabetes mellitus with diabetic chronic kidney disease: Secondary | ICD-10-CM

## 2023-09-14 DIAGNOSIS — I700 Atherosclerosis of aorta: Secondary | ICD-10-CM

## 2023-09-14 DIAGNOSIS — E785 Hyperlipidemia, unspecified: Secondary | ICD-10-CM

## 2023-09-14 DIAGNOSIS — I152 Hypertension secondary to endocrine disorders: Secondary | ICD-10-CM

## 2023-09-14 DIAGNOSIS — Z8673 Personal history of transient ischemic attack (TIA), and cerebral infarction without residual deficits: Secondary | ICD-10-CM

## 2023-09-14 DIAGNOSIS — C50411 Malignant neoplasm of upper-outer quadrant of right female breast: Secondary | ICD-10-CM | POA: Diagnosis not present

## 2023-09-14 DIAGNOSIS — Z170 Estrogen receptor positive status [ER+]: Secondary | ICD-10-CM

## 2023-09-14 NOTE — Patient Instructions (Addendum)
Medication Instructions:  No changes *If you need a refill on your cardiac medications before your next appointment, please call your pharmacy*   Lab Work: none   Testing/Procedures: Your physician has requested that you have an echocardiogram. Echocardiography is a painless test that uses sound waves to create images of your heart. It provides your doctor with information about the size and shape of your heart and how well your heart's chambers and valves are working. This procedure takes approximately one hour. There are no restrictions for this procedure. Please do NOT wear cologne, perfume, aftershave, or lotions (deodorant is allowed). Please arrive 15 minutes prior to your appointment time.  NM PET CT STRESS TEST - see instructions below  Follow-Up: At [facility], you and your health needs are our priority.  As part of our continuing mission to provide you with exceptional heart care, we have created designated Provider Care Teams.  These Care Teams include your primary Cardiologist (physician) and Advanced Practice Providers (APPs -  Physician Assistants and Nurse Practitioners) who all work together to provide you with the care you need, when you need it.    Your next appointment:   6 month(s)  Provider:   Orbie Pyo, MD     Other Instructions How to Prepare for Your Cardiac PET/CT Stress Test:  1. Please do not take these medications before your test:   Medications that may interfere with the cardiac pharmacological stress agent (ex. nitrates - including erectile dysfunction medications, isosorbide mononitrate, tamulosin or beta-blockers) the day of the exam. (Erectile dysfunction medication should be held for at least 72 hrs prior to test) Theophylline containing medications for 12 hours. Dipyridamole 48 hours prior to the test. Your remaining medications may be taken with water.  2. Nothing to eat or drink, except water, 3 hours prior to arrival time.    NO caffeine/decaffeinated products, or chocolate 12 hours prior to arrival.  3. NO perfume, cologne or lotion on chest or abdomen area.          - FEMALES - Please avoid wearing dresses to this appointment.  4. Total time is 1 to 2 hours; you may want to bring reading material for the waiting time.  5. Please report to Radiology at the [facility] Main Entrance 30 minutes early for your test.  [address] [place], [place] [address]  6. Please report to Radiology at [facility] Main Entrance, medical mall, 30 mins prior to your test.  [address]  [place], [place]  [phone]   In preparation for your appointment, medication and supplies will be purchased.  Appointment availability is limited, so if you need to cancel or reschedule, please call the Radiology Department at [phone] [facility]) OR [phone] [facility])  24 hours in advance to avoid a cancellation fee of $100.00  What to Expect After you Arrive:  Once you arrive and check in for your appointment, you will be taken to a preparation room within the Radiology Department.  A technologist or Nurse will obtain your medical history, verify that you are correctly prepped for the exam, and explain the procedure.  Afterwards,  an IV will be started in your arm and electrodes will be placed on your skin for EKG monitoring during the stress portion of the exam. Then you will be escorted to the PET/CT scanner.  There, staff will get you positioned on the scanner and obtain a blood pressure and EKG.  During the  exam, you will continue to be connected to the EKG and blood pressure machines.  A small, safe amount of a radioactive tracer will be injected in your IV to obtain a series of pictures of your heart along with an injection of a stress agent.    After your Exam:  It is recommended that you eat a meal and drink a caffeinated beverage to counter act any effects of the stress agent.   Drink plenty of fluids for the remainder of the day and urinate frequently for the first couple of hours after the exam.  Your doctor will inform you of your test results within 7-10 business days.  For more information and frequently asked questions, please visit our website : [email or web address]  For questions about your test or how to prepare for your test, please call: Cardiac Imaging Nurse Navigators Office: [phone]

## 2023-09-16 ENCOUNTER — Ambulatory Visit: Payer: Medicare (Managed Care) | Admitting: Hematology and Oncology

## 2023-09-19 NOTE — Progress Notes (Deleted)
Palliative Medicine [facility]  [phone] [phone]   Name: Kirsten Mcmahon Date: 09/19/2023 MRN: [id number]  DOB: 05-18-1946  Patient Care Team: [facility] as PCP - General Lynnette Caffey, Charlies Constable, MD as PCP - Cardiology (Cardiology) Berenice Primas, [facility] Pershing Proud, RN as Oncology Nurse Navigator Donnelly Angelica, RN as Oncology Nurse Navigator Serena Croissant, MD as Consulting Physician (Hematology and Oncology)    REASON FOR CONSULTATION: Kirsten Mcmahon is a 77 y.o. female with oncologic medical history including malignant neoplasm of breast, estrogen receptor positive (01/2017) as well as vulvar squamous cell carcinoma (01/2017).  Palliative ask to see for symptom management and goals of care.    SOCIAL HISTORY:     reports that she has never smoked. She has never used smokeless tobacco. She reports that she does not drink alcohol and does not use drugs.  ADVANCE DIRECTIVES:  None on file  CODE STATUS: DNR  PAST MEDICAL HISTORY: Past Medical History:  Diagnosis Date   Cancer (HCC) 2017   Right breast   Chronic kidney disease    progression to ESRD 04/10/21   GERD (gastroesophageal reflux disease)    Glaucoma    Hemiparesis (HCC)    left side   High cholesterol    History of seizure    after a spider bite; 07/15/21   History of stroke with residual deficit    left-side weakness   Hypertension    states BP under control with meds., has been on med. x 2 yr.   Hypothyroidism    Non-insulin dependent type 2 diabetes mellitus (HCC)    Overactive bladder    PFO (patent foramen ovale) 05/15/2021   Stroke (HCC)    1998 weakness on left side; 05/12/21    PAST SURGICAL HISTORY:  Past Surgical History:  Procedure Laterality Date   ABDOMINAL HYSTERECTOMY     complete   AV FISTULA PLACEMENT Right 09/29/2021   Procedure: INSERTION OF RIGHT ARM ARTERIOVENOUS (AV) GORE-TEX GRAFT;  Surgeon: Chuck Hint, MD;  Location: [facility] OR;  Service: Vascular;  Laterality: Right;   BREAST BIOPSY Left 08/03/2018   Benign adipose tissue   BREAST BIOPSY Right 02/15/2023   [place] RT BREAST BX W LOC DEV 1ST LESION IMG BX SPEC US GUIDE 02/15/2023 [facility]HY   BREAST BIOPSY Left 02/15/2023   [place] LT BREAST BX W LOC DEV 1ST LESION IMG BX SPEC US GUIDE 02/15/2023 [facility]MOGR[facility]HY   BREAST EXCISIONAL BIOPSY Right 2014   Positive   BREAST LUMPECTOMY Right    BUBBLE STUDY  05/15/2021   Procedure: BUBBLE STUDY;  Surgeon: Jake Bathe, MD;  Location: [facility] ENDOSCOPY;  Service: Cardiovascular;;   CATARACT EXTRACTION W/ INTRAOCULAR LENS IMPLANT Left    CEREBRAL ANEURYSM REPAIR  1998   DIALYSIS/PERMA CATHETER INSERTION N/A 04/10/2021   Procedure: DIALYSIS/PERMA CATHETER INSERTION;  Surgeon: Annice Needy, MD;  Location: [facility] INVASIVE CV LAB;  Service: Cardiovascular;  Laterality: N/A;   IR FLUORO GUIDE CV LINE RIGHT  04/30/2021   PICC LINE INSERTION     TEE WITHOUT CARDIOVERSION N/A 05/15/2021   Procedure: TRANSESOPHAGEAL ECHOCARDIOGRAM (TEE);  Surgeon: Jake Bathe, MD;  Location: [facility] ENDOSCOPY;  Service: Cardiovascular;  Laterality: N/A;   THYROID LOBECTOMY Right 07/15/2017   Procedure: RIGHT THYROID LOBECTOMY;  Surgeon: Darnell Level, MD;  Location: [facility] OR;  Service: General;  Laterality: Right;    HEMATOLOGY/ONCOLOGY HISTORY:  Oncology History  Malignant neoplasm  of upper-outer quadrant of right breast in female, estrogen receptor positive (HCC)  03/14/2015 Initial Diagnosis   On workup for blood work cancer PET/CT showed right breast lesion, 11:00 position 2.6 cm diameter biopsy done 05/20/2015 IDC grade 2 ER 95% PR 80% HER-2 negative   07/14/2015 Surgery   Right lumpectomy: IDC grade 2, 2.5 cm, 1/2 sentinel nodes positive, DCIS, LCIS, lateral inferior and deep margins positive for LCIS, T2 N1 M0 stage II a, radiation did not recommend adjuvant radiation ( at [facility])   08/21/2015 -  Anti-estrogen  oral therapy   Letrozole 2.5 mg daily   02/15/2023 Relapse/Recurrence   Right breast nipple changes, new heterogeneous calcifications, new asymmetry right retroareolar region 2 cm area, right axilla: 1 abnormal lymph node, ill-defined hypoechoic area left breast 5.1 cm diffuse skin thickening left breast: Right breast biopsy: Grade 2 IDC ER 95%, PR 95%, Ki-67 5%, HER2 1+ negative; left breast biopsy: Benign   Primary vulvar squamous cell carcinoma (HCC)  03/25/2015 Surgery   Vulvectomy by Dr. Tresa Endo at [facility]; invasive squamous cell carcinoma margins are negative, did not require any further treatment     ALLERGIES:  is allergic to contrast media [iodinated contrast media], latex, shellfish-derived products, and levemir [insulin detemir].  MEDICATIONS:  Current Outpatient Medications  Medication Sig Dispense Refill   acetaminophen (TYLENOL) 500 MG tablet Take 1,000 mg by mouth daily.     anastrozole (ARIMIDEX) 1 MG tablet Take 1 tablet (1 mg total) by mouth daily. (Patient not taking: Reported on 09/14/2023) 90 tablet 3   aspirin 81 MG chewable tablet Chew 1 tablet (81 mg total) by mouth daily. 60 tablet 0   bisacodyl (DULCOLAX) 5 MG EC tablet Take 1 tablet (5 mg total) by mouth daily as needed for moderate constipation. (Patient not taking: Reported on 07/29/2023) 30 tablet 0   calcitRIOL (ROCALTROL) 0.5 MCG capsule Take 1 capsule (0.5 mcg total) by mouth every Monday, Wednesday, and Friday with hemodialysis. 30 capsule 0   chlorhexidine gluconate, MEDLINE KIT, (PERIDEX) 0.12 % solution 15 mLs by Mouth Rinse route 2 (two) times daily. (Patient not taking: Reported on 09/14/2023) 120 mL 0   Cinacalcet HCl (SENSIPAR PO) Take by mouth.     dextrose (GLUTOSE) 40 % GEL Place 28 mLs inside cheek as needed (for suspected low sugars). 37.5 g 2   diazePAM, 15 MG Dose, (VALTOCO 15 MG DOSE) 2 x 7.5 MG/0.1ML LQPK Place 15 mg into the nose once as needed for up to 1 dose (for seizure lasting more than 2  minutes.). 2 each 0   docusate sodium (COLACE) 100 MG capsule Take 1 capsule (100 mg total) by mouth 2 (two) times daily. (Patient not taking: Reported on 09/14/2023) 20 capsule 0   donepezil (ARICEPT) 10 MG tablet Take 10 mg by mouth daily. (Patient not taking: Reported on 09/14/2023)     famotidine (PEPCID) 10 MG tablet Take 10 mg by mouth daily. (Patient not taking: Reported on 09/14/2023)     lacosamide 150 MG TABS Take 1 tablet (150 mg total) by mouth 2 (two) times daily. 60 tablet 0   latanoprost (XALATAN) 0.005 % ophthalmic solution Place 1 drop into both eyes at bedtime. 2.5 mL 0   levothyroxine (SYNTHROID, LEVOTHROID) 112 MCG tablet Take 1 tablet (112 mcg total) by mouth daily before breakfast. 90 tablet 0   lidocaine-prilocaine (EMLA) cream Apply 1 application. topically 3 (three) times a week.     Methoxy PEG-Epoetin Beta (MIRCERA IJ) Mircera  metoprolol succinate (TOPROL-XL) 25 MG 24 hr tablet Take 1 tablet (25 mg total) by mouth daily. 30 tablet 0   multivitamin (RENA-VIT) TABS tablet Take 1 tablet by mouth at bedtime. 30 tablet 0   Nutritional Supplements (,FEEDING SUPPLEMENT, PROSOURCE PLUS) liquid Take 30 mLs by mouth 3 (three) times daily between meals. (Patient not taking: Reported on 09/14/2023) 1000 mL 0   sucroferric oxyhydroxide (VELPHORO) 500 MG chewable tablet Chew 500 mg by mouth 3 (three) times daily with meals. (Patient not taking: Reported on 09/14/2023)     No current facility-administered medications for this visit.    VITAL SIGNS: There were no vitals taken for this visit. There were no vitals filed for this visit.  Estimated body mass index is 23.41 kg/m as calculated from the following:   Height as of 09/14/23: 5\' 2"  (1.575 m).   Weight as of 09/14/23: 128 lb (58.1 kg).  LABS: CBC:    Component Value Date/Time   WBC 8.2 06/06/2023 0917   HGB 9.7 (L) 06/06/2023 0917   HCT 29.8 (L) 06/06/2023 0917   PLT 251 06/06/2023 0917   MCV 98.7 06/06/2023 0917    NEUTROABS 9.0 (H) 05/30/2023 1524   LYMPHSABS 1.4 05/30/2023 1524   MONOABS 0.5 05/30/2023 1524   EOSABS 0.1 05/30/2023 1524   BASOSABS 0.0 05/30/2023 1524   Comprehensive Metabolic Panel:    Component Value Date/Time   NA 128 (L) 06/06/2023 0918   K 5.1 06/06/2023 0918   CL 90 (L) 06/06/2023 0918   CO2 26 06/06/2023 0918   BUN 37 (H) 06/06/2023 0918   CREATININE 8.23 (H) 06/06/2023 0918   GLUCOSE 95 06/06/2023 0918   CALCIUM 9.1 06/06/2023 0918   AST 25 05/25/2023 1732   ALT 18 05/25/2023 1732   ALKPHOS 73 05/25/2023 1732   BILITOT 0.4 05/25/2023 1732   PROT 8.0 05/25/2023 1732   ALBUMIN 3.0 (L) 06/06/2023 0918    RADIOGRAPHIC STUDIES: CT Chest Wo Contrast  Result Date: 08/22/2023 CLINICAL DATA:  Malignant neoplasm of upper-outer quadrant of right breast. Staging prior to systemic therapy. * Tracking Code: BO * EXAM: CT CHEST, ABDOMEN AND PELVIS WITHOUT CONTRAST TECHNIQUE: Multidetector CT imaging of the chest, abdomen and pelvis was performed following the standard protocol without IV contrast. RADIATION DOSE REDUCTION: This exam was performed according to the departmental dose-optimization program which includes automated exposure control, adjustment of the mA and/or kV according to patient size and/or use of iterative reconstruction technique. COMPARISON:  06/23/2022 abdominopelvic CT.  03/08/2022 chest CT. FINDINGS: CT CHEST FINDINGS Cardiovascular: Mild degradation secondary to left arm position (not raised above the head) and lack of IV contrast. Aortic atherosclerosis. Tortuous thoracic aorta. Moderate cardiomegaly, without pericardial effusion. Three vessel coronary artery calcification. Mediastinum/Nodes: Right thyroidectomy. No supraclavicular adenopathy. No subpectoral adenopathy. No axillary adenopathy. No mediastinal or hilar adenopathy, given limitations of unenhanced CT. Lungs/Pleura: No pleural fluid.  Bibasilar scarring. Musculoskeletal: Surgical or biopsy clips in the  left breast. Left breast and skin thickening are nonspecific. Central right sided lumpectomy. Soft tissue fullness in the region of the nipple including on 36/2 is new since 03/08/2022. More lateral right breast nodularity including at 1.0 cm on 38/2, nonspecific. No acute osseous abnormality. CT ABDOMEN PELVIS FINDINGS Hepatobiliary: Normal noncontrast appearance of the liver. Stone filled gallbladder without acute cholecystitis or biliary duct dilatation. Pancreas: Pancreatic tail 1.4 cm cystic lesion on 44/2 is similar to on the prior. No duct dilatation or acute inflammation. Spleen: Normal in size, without focal  abnormality. Adrenals/Urinary Tract: Bilateral adrenal thickening with maintenance of adreniform shape. Moderate bilateral renal cortical thinning. punctate left renal collecting system calculi. No hydronephrosis. No bladder stones. Stomach/Bowel: Normal stomach, without wall thickening. Scattered colonic diverticula. Colonic stool burden suggests constipation. Normal terminal ileum and appendix. Normal small bowel. Vascular/Lymphatic: Aortic atherosclerosis. No abdominopelvic adenopathy. Reproductive: Hysterectomy.  No adnexal mass. Other: No significant free fluid. Moderate pelvic floor laxity. No free intraperitoneal air. No evidence of omental or peritoneal disease. Musculoskeletal: Osteopenia. Lumbosacral spondylosis. Convex left thoracolumbar spine curvature. IMPRESSION: 1. Mild limitations secondary to lack of IV contrast and patient left arm position, not raised above the head. 2. Increased soft tissue density in the periareolar right breast likely represents the site of recurrent/progressive disease. Nonspecific nodularity within the more lateral right breast. 3. No typical findings of metastatic disease in the chest, abdomen, or pelvis. 4. Incidental findings, including: Coronary artery atherosclerosis. Aortic Atherosclerosis (ICD10-I70.0). Bilateral renal atrophy. Left nephrolithiasis.  Cholelithiasis. Possible constipation. 5. Pancreatic tail cystic lesion of 1.4 cm is similar to 06/23/2022 and of doubtful clinical significance given comorbidities. Most likely a pseudocyst or indolent cystic neoplasm. Recommend attention on follow-up. Electronically Signed   By: Jeronimo Greaves M.D.   On: 08/22/2023 12:31    PERFORMANCE STATUS (ECOG) : {CHL ONC ECOG [id number]}  Review of Systems Unless otherwise noted, a complete review of systems is negative.  Physical Exam General: NAD Cardiovascular: regular rate and rhythm Pulmonary: clear ant fields Abdomen: soft, nontender, + bowel sounds Extremities: no edema, no joint deformities Skin: no rashes Neurological:  IMPRESSION: *** I introduced myself, Chicquita Mendel RN, and Palliative's role in collaboration with the oncology team. Concept of Palliative Care was introduced as specialized medical care for people and their families living with serious illness.  It focuses on providing relief from the symptoms and stress of a serious illness.  The goal is to improve quality of life for both the patient and the family. Values and goals of care important to patient and family were attempted to be elicited.    We discussed *** current illness and what it means in the larger context of *** on-going co-morbidities. Natural disease trajectory and expectations were discussed.  I discussed the importance of continued conversation with family and their medical providers regarding overall plan of care and treatment options, ensuring decisions are within the context of the patients values and GOCs.  PLAN: Established therapeutic relationship. Education provided on palliative's role in collaboration with their Oncology/Radiation team. I will plan to see patient back in 2-4 weeks in collaboration to other oncology appointments.    Patient expressed understanding and was in agreement with this plan. She also understands that She can call the clinic at any  time with any questions, concerns, or complaints.   Thank you for your referral and allowing Palliative to assist in Mrs. Costella Hatcher Pociask's care.   Number and complexity of problems addressed: ***HIGH - 1 or more chronic illnesses with SEVERE exacerbation, progression, or side effects of treatment - advanced cancer, pain. Any controlled substances utilized were prescribed in the context of palliative care.   Visit consisted of counseling and education dealing with the complex and emotionally intense issues of symptom management and palliative care in the setting of serious and potentially life-threatening illness.Greater than 50%  of this time was spent counseling and coordinating care related to the above assessment and plan.  Signed by: Willette Alma, AGPCNP-BC Palliative Medicine Team/[facility]   *Please note that this  is a verbal dictation therefore any spelling or grammatical errors are due to the "[employer]" system interpretation.

## 2023-09-20 ENCOUNTER — Ambulatory Visit (HOSPITAL_COMMUNITY): Admission: RE | Admit: 2023-09-20 | Payer: Medicare (Managed Care) | Source: Ambulatory Visit

## 2023-09-21 ENCOUNTER — Inpatient Hospital Stay (HOSPITAL_COMMUNITY): Admission: RE | Admit: 2023-09-21 | Payer: Medicare (Managed Care) | Source: Ambulatory Visit

## 2023-09-21 ENCOUNTER — Inpatient Hospital Stay: Payer: Medicare (Managed Care) | Attending: Adult Health | Admitting: Nurse Practitioner

## 2023-09-21 ENCOUNTER — Inpatient Hospital Stay: Payer: Medicare (Managed Care) | Admitting: Hematology and Oncology

## 2023-09-23 ENCOUNTER — Encounter (HOSPITAL_COMMUNITY): Payer: Self-pay

## 2023-09-27 ENCOUNTER — Ambulatory Visit (HOSPITAL_COMMUNITY): Admission: RE | Admit: 2023-09-27 | Payer: Medicare (Managed Care) | Source: Ambulatory Visit

## 2023-10-03 ENCOUNTER — Encounter: Payer: Self-pay | Admitting: *Deleted

## 2023-10-05 ENCOUNTER — Ambulatory Visit (HOSPITAL_COMMUNITY): Payer: Medicare (Managed Care) | Attending: Internal Medicine

## 2023-10-05 DIAGNOSIS — Z01810 Encounter for preprocedural cardiovascular examination: Secondary | ICD-10-CM

## 2023-10-05 LAB — ECHOCARDIOGRAM COMPLETE
Area-P 1/2: 3.39 cm2
S' Lateral: 2.3 cm

## 2023-10-06 ENCOUNTER — Telehealth: Payer: Self-pay

## 2023-10-06 NOTE — Telephone Encounter (Signed)
Patient declined palliative services at this time. Palliative services available upon request or re-consultation.

## 2023-10-10 ENCOUNTER — Emergency Department (HOSPITAL_COMMUNITY)
Admission: EM | Admit: 2023-10-10 | Discharge: 2023-10-10 | Disposition: A | Discharge status: Home / Self Care | Payer: Medicare (Managed Care) | Attending: Emergency Medicine | Admitting: Emergency Medicine

## 2023-10-10 ENCOUNTER — Other Ambulatory Visit: Payer: Self-pay

## 2023-10-10 ENCOUNTER — Emergency Department (HOSPITAL_COMMUNITY): Payer: Medicare (Managed Care)

## 2023-10-10 DIAGNOSIS — N186 End stage renal disease: Secondary | ICD-10-CM | POA: Diagnosis not present

## 2023-10-10 DIAGNOSIS — R569 Unspecified convulsions: Secondary | ICD-10-CM | POA: Diagnosis present

## 2023-10-10 DIAGNOSIS — Z79899 Other long term (current) drug therapy: Secondary | ICD-10-CM | POA: Diagnosis not present

## 2023-10-10 DIAGNOSIS — Z7982 Long term (current) use of aspirin: Secondary | ICD-10-CM | POA: Insufficient documentation

## 2023-10-10 DIAGNOSIS — I120 Hypertensive chronic kidney disease with stage 5 chronic kidney disease or end stage renal disease: Secondary | ICD-10-CM | POA: Diagnosis not present

## 2023-10-10 DIAGNOSIS — F0390 Unspecified dementia without behavioral disturbance: Secondary | ICD-10-CM | POA: Diagnosis not present

## 2023-10-10 DIAGNOSIS — Z992 Dependence on renal dialysis: Secondary | ICD-10-CM | POA: Insufficient documentation

## 2023-10-10 DIAGNOSIS — Z91040 Latex allergy status: Secondary | ICD-10-CM | POA: Diagnosis not present

## 2023-10-10 LAB — CBC WITH DIFFERENTIAL/PLATELET
Abs Immature Granulocytes: 0.12 10*3/uL — ABNORMAL HIGH (ref 0.00–0.07)
Basophils Absolute: 0.1 10*3/uL (ref 0.0–0.1)
Basophils Relative: 0 %
Eosinophils Absolute: 0.1 10*3/uL (ref 0.0–0.5)
Eosinophils Relative: 0 %
HCT: 36.6 % (ref 36.0–46.0)
Hemoglobin: 11.6 g/dL — ABNORMAL LOW (ref 12.0–15.0)
Immature Granulocytes: 1 %
Lymphocytes Relative: 8 %
Lymphs Abs: 1.1 10*3/uL (ref 0.7–4.0)
MCH: 32 pg (ref 26.0–34.0)
MCHC: 31.7 g/dL (ref 30.0–36.0)
MCV: 101.1 fL — ABNORMAL HIGH (ref 80.0–100.0)
Monocytes Absolute: 0.8 10*3/uL (ref 0.1–1.0)
Monocytes Relative: 6 %
Neutro Abs: 11.2 10*3/uL — ABNORMAL HIGH (ref 1.7–7.7)
Neutrophils Relative %: 85 %
Platelets: 191 10*3/uL (ref 150–400)
RBC: 3.62 MIL/uL — ABNORMAL LOW (ref 3.87–5.11)
RDW: 14.9 % (ref 11.5–15.5)
WBC: 13.4 10*3/uL — ABNORMAL HIGH (ref 4.0–10.5)
nRBC: 0 % (ref 0.0–0.2)

## 2023-10-10 LAB — COMPREHENSIVE METABOLIC PANEL
ALT: 18 U/L (ref 0–44)
AST: 24 U/L (ref 15–41)
Albumin: 3.7 g/dL (ref 3.5–5.0)
Alkaline Phosphatase: 58 U/L (ref 38–126)
Anion gap: 19 — ABNORMAL HIGH (ref 5–15)
BUN: 39 mg/dL — ABNORMAL HIGH (ref 8–23)
CO2: 27 mmol/L (ref 22–32)
Calcium: 9.2 mg/dL (ref 8.9–10.3)
Chloride: 95 mmol/L — ABNORMAL LOW (ref 98–111)
Creatinine, Ser: 7.68 mg/dL — ABNORMAL HIGH (ref 0.44–1.00)
GFR, Estimated: 5 mL/min — ABNORMAL LOW (ref >60)
Glucose, Bld: 140 mg/dL — ABNORMAL HIGH (ref 70–99)
Potassium: 4.1 mmol/L (ref 3.5–5.1)
Sodium: 141 mmol/L (ref 135–145)
Total Bilirubin: 0.5 mg/dL (ref <1.2)
Total Protein: 8.1 g/dL (ref 6.5–8.1)

## 2023-10-10 LAB — CBG MONITORING, ED: Glucose-Capillary: 129 mg/dL — ABNORMAL HIGH (ref 70–99)

## 2023-10-10 MED ORDER — LACOSAMIDE 50 MG PO TABS: 150.0000 mg | ORAL_TABLET | Freq: Two times a day (BID) | ORAL | Status: DC

## 2023-10-10 NOTE — ED Notes (Signed)
Son Devri Kreher [phone] would like an update asap

## 2023-10-10 NOTE — ED Triage Notes (Addendum)
Pt from dialysis with EMS for seizure. Pt was 2 hours into her 4 hour dialysis session when she had a seizure lasting about 1 min, staff described her seizure as full body shaking. Pt arrives to ED alert, verbal but disoriented x4 VSS Hx of stroke with left sided deficits

## 2023-10-10 NOTE — Discharge Instructions (Signed)
I think you had your seizure today because you missed your seizure medication.  Please make sure to take this and follow-up with your doctor.  Return to the ER for worsening symptoms.

## 2023-10-10 NOTE — ED Provider Notes (Signed)
[place] EMERGENCY DEPARTMENT AT [facility] Provider Note   CSN: [id number] Arrival date & time: 10/10/23  1330     History  Chief Complaint  Patient presents with   Seizures    Kirsten Mcmahon is a 77 y.o. female.  77 year old female with past medical history of end-stage renal disease on dialysis Mondays, Wednesdays, and Fridays, and hypertension as well as dementia presenting to the emergency department today after an apparent seizure.  The patient does have a history of seizures.  Based on her medication list it does not appear that she is on any antiepileptic medications.  She apparently had a 1 minute long seizure during dialysis today.  This resolved without any treatment.  The patient also has a history of breast cancer.  I am unable to get much more history of the patient at this time.  Unclear if this is her baseline mental status or with her being postictal.  She is denying any complaints currently.   Seizures      Home Medications Prior to Admission medications   Medication Sig Start Date End Date Taking? Authorizing Provider  acetaminophen (TYLENOL) 500 MG tablet Take 1,000 mg by mouth daily.    [provider]  anastrozole (ARIMIDEX) 1 MG tablet Take 1 tablet (1 mg total) by mouth daily. Patient not taking: Reported on 09/14/2023 03/17/23   Serena Croissant, MD  aspirin 81 MG chewable tablet Chew 1 tablet (81 mg total) by mouth daily. 08/01/21   Rolly Salter, MD  bisacodyl (DULCOLAX) 5 MG EC tablet Take 1 tablet (5 mg total) by mouth daily as needed for moderate constipation. Patient not taking: Reported on 07/29/2023 06/06/23 06/05/24  Rolly Salter, MD  calcitRIOL (ROCALTROL) 0.5 MCG capsule Take 1 capsule (0.5 mcg total) by mouth every Monday, Wednesday, and Friday with hemodialysis. 07/31/21   Rolly Salter, MD  chlorhexidine gluconate, MEDLINE KIT, (PERIDEX) 0.12 % solution 15 mLs by Mouth Rinse route 2 (two) times daily. Patient not taking:  Reported on 09/14/2023 06/04/23   Rolly Salter, MD  Cinacalcet HCl (SENSIPAR PO) Take by mouth. 08/15/23 08/13/24  [provider]  dextrose (GLUTOSE) 40 % GEL Place 28 mLs inside cheek as needed (for suspected low sugars). 07/31/21   Rolly Salter, MD  diazePAM, 15 MG Dose, (VALTOCO 15 MG DOSE) 2 x 7.5 MG/0.1ML LQPK Place 15 mg into the nose once as needed for up to 1 dose (for seizure lasting more than 2 minutes.). 06/04/23   Rolly Salter, MD  docusate sodium (COLACE) 100 MG capsule Take 1 capsule (100 mg total) by mouth 2 (two) times daily. Patient not taking: Reported on 09/14/2023 06/06/23   Rolly Salter, MD  donepezil (ARICEPT) 10 MG tablet Take 10 mg by mouth daily. Patient not taking: Reported on 09/14/2023 06/28/22   [provider]  famotidine (PEPCID) 10 MG tablet Take 10 mg by mouth daily. Patient not taking: Reported on 09/14/2023    [provider]  lacosamide 150 MG TABS Take 1 tablet (150 mg total) by mouth 2 (two) times daily. 06/04/23   Rolly Salter, MD  latanoprost (XALATAN) 0.005 % ophthalmic solution Place 1 drop into both eyes at bedtime. 09/07/18   Tommie Sams, DO  levothyroxine (SYNTHROID, LEVOTHROID) 112 MCG tablet Take 1 tablet (112 mcg total) by mouth daily before breakfast. 09/07/18   Tommie Sams, DO  lidocaine-prilocaine (EMLA) cream Apply 1 application. topically 3 (three) times a  week. 02/04/22   [provider]  Methoxy PEG-Epoetin Beta (MIRCERA IJ) Mircera 08/10/23 08/08/24  [provider]  metoprolol succinate (TOPROL-XL) 25 MG 24 hr tablet Take 1 tablet (25 mg total) by mouth daily. 06/04/23   Rolly Salter, MD  multivitamin (RENA-VIT) TABS tablet Take 1 tablet by mouth at bedtime. 07/31/21   Rolly Salter, MD  Nutritional Supplements (,FEEDING SUPPLEMENT, PROSOURCE PLUS) liquid Take 30 mLs by mouth 3 (three) times daily between meals. Patient not taking: Reported on 09/14/2023 07/31/21   Rolly Salter, MD  sucroferric  oxyhydroxide (VELPHORO) 500 MG chewable tablet Chew 500 mg by mouth 3 (three) times daily with meals. Patient not taking: Reported on 09/14/2023 10/13/21   [provider]      Allergies    Contrast media [iodinated contrast media], Latex, Shellfish-derived products, and Levemir [insulin detemir]    Review of Systems   Review of Systems  Reason unable to perform ROS: Mental status.  Neurological:  Positive for seizures.    Physical Exam Updated Vital Signs BP 128/70   Pulse 89   Temp 98.3 F (36.8 C) (Axillary)   Resp (!) 21   SpO2 100%  Physical Exam Vitals and nursing note reviewed.   Gen: NAD Eyes: PERRL, EOMI HEENT: no oropharyngeal swelling Neck: trachea midline, no meningismus Resp: clear to auscultation bilaterally Card: RRR, no murmurs, rubs, or gallops Abd: nontender, nondistended Extremities: no calf tenderness, no edema Vascular: 2+ radial pulses bilaterally, 2+ DP pulses bilaterally Neuro: Left sided deficits noted Skin: no rashes Psyc: acting appropriately   ED Results / Procedures / Treatments   Labs (all labs ordered are listed, but only abnormal results are displayed) Labs Reviewed  CBC WITH DIFFERENTIAL/PLATELET - Abnormal; Notable for the following components:      Result Value   WBC 13.4 (*)    RBC 3.62 (*)    Hemoglobin 11.6 (*)    MCV 101.1 (*)    Neutro Abs 11.2 (*)    Abs Immature Granulocytes 0.12 (*)    All other components within normal limits  COMPREHENSIVE METABOLIC PANEL - Abnormal; Notable for the following components:   Chloride 95 (*)    Glucose, Bld 140 (*)    BUN 39 (*)    Creatinine, Ser 7.68 (*)    GFR, Estimated 5 (*)    Anion gap 19 (*)    All other components within normal limits  CBG MONITORING, ED - Abnormal; Notable for the following components:   Glucose-Capillary 129 (*)    All other components within normal limits    EKG None  Radiology CT Head Wo Contrast  Result Date: 10/10/2023 CLINICAL DATA:   Seizure in dialysis EXAM: CT HEAD WITHOUT CONTRAST TECHNIQUE: Contiguous axial images were obtained from the base of the skull through the vertex without intravenous contrast. RADIATION DOSE REDUCTION: This exam was performed according to the departmental dose-optimization program which includes automated exposure control, adjustment of the mA and/or kV according to patient size and/or use of iterative reconstruction technique. COMPARISON:  05/30/2023 FINDINGS: Brain: No evidence of acute infarction, hemorrhage, mass, mass effect, or midline shift. No hydrocephalus or extra-axial fluid collection. Redemonstrated sequela of right frontal craniectomy with subjacent encephalomalacia in the right MCA territory. Remote infarcts in the right occipital lobe and bilateral cerebellar hemispheres. Vascular: No hyperdense vessel. Atherosclerotic calcifications in the intracranial carotid and vertebral arteries. Skull: Negative for fracture or focal lesion. Redemonstrated right frontal craniectomy. Sinuses/Orbits: No acute finding. Other: The  mastoid air cells are well aerated. IMPRESSION: No acute intracranial process. Electronically Signed   By: Wiliam Ke M.D.   On: 10/10/2023 18:56    Procedures Procedures    Medications Ordered in ED Medications  lacosamide (VIMPAT) tablet 150 mg (has no administration in time range)    ED Course/ Medical Decision Making/ A&P                                 Medical Decision Making 77 year old female with past medical history of seizures and end-stage renal disease presenting to the emergency department today after a seizure at dialysis.  The patient does appear to be confused here and I am unclear of her baseline.  She is on donepezil also does carry a history of dementia as well.  Looking back through her most recent hospitalization notes it does appear that she was confused back in July when she was here.  She is post be on Vimpat.  I will further evaluate the patient  here with basic labs to evaluate for electrolyte abnormalities.  Will obtain a CT scan of her head to evaluate for intracranial hemorrhage or mass lesion given her history of cancer.  I will observe the patient here and reevaluate for ultimate disposition.  The patient's labs are stable.  CT scan does not show any acute findings.  I did call and discuss her case with her son.  It appears that the patient missed her morning dose of Vimpat because they were trying to get her to dialysis I suspect that this is the reason for her seizure.  She is given a dose here.  I think that she is stable for discharge.  According to her son she does have some baseline confusion and it seems that she is at her baseline.  Amount and/or Complexity of Data Reviewed Labs: ordered. Radiology: ordered.  Risk Prescription drug management.           Final Clinical Impression(s) / ED Diagnoses Final diagnoses:  Seizure [facility])    Rx / DC Orders ED Discharge Orders     None         Durwin Glaze, MD 10/10/23 1920

## 2023-10-10 NOTE — Progress Notes (Signed)
Deaccessed fistula per order. Manual pressure held per policy to achieve hemostasis. Gauze bandage applied. Clean dry & intact at this time. Primary RN aware. Primary RN & pt educated on signs and symptoms of when to call provider.

## 2023-10-11 ENCOUNTER — Telehealth (HOSPITAL_COMMUNITY): Payer: Self-pay | Admitting: *Deleted

## 2023-10-11 NOTE — Telephone Encounter (Signed)
Reaching out to patient to offer assistance regarding upcoming cardiac imaging study; pt's son answered phone and verbalizes understanding of appt date/time, parking situation and where to check in, pre-test NPO status, and verified current allergies; name and call back number provided for further questions should they arise  Larey Brick RN Navigator Cardiac Imaging [facility] [phone] office [phone] cell  Patient's son aware that the patient is to avoid caffeine 12 hours prior to her cardiac PET scan.

## 2023-10-12 ENCOUNTER — Encounter (HOSPITAL_COMMUNITY): Payer: Self-pay

## 2023-10-12 ENCOUNTER — Telehealth: Payer: Self-pay | Admitting: Cardiovascular Disease

## 2023-10-12 ENCOUNTER — Ambulatory Visit (HOSPITAL_COMMUNITY): Admission: RE | Admit: 2023-10-12 | Payer: Medicare (Managed Care) | Source: Ambulatory Visit

## 2023-10-12 NOTE — Telephone Encounter (Signed)
error 

## 2023-10-12 NOTE — Progress Notes (Unsigned)
Cardiology Office Note:   Date:  10/13/2023  ID:  Kirsten Mcmahon, DOB 1946-01-05, MRN [id number] PCP:  [facility]  [facility] Providers Cardiologist:  Alverda Skeans, MD Referring MD: [facility]*  Chief Complaint/Reason for Referral: Preoperative cardiovascular examination ASSESSMENT:    1. Preoperative cardiovascular examination   2. Malignant neoplasm of upper-outer quadrant of right breast in female, estrogen receptor positive (HCC)   3. Hyperlipidemia LDL goal <70   4. Primary hypertension   5. History of CVA (cerebrovascular accident)   6. Aortic atherosclerosis (HCC)      PLAN:   In order of problems listed above: Preoperative cardiovascular examination: Her echocardiogram was reassuring.  Her PET stress test was canceled due to presentation with a seizure a few days ago.  This is due to a missed dose of her seizure medication.  Will obtain PET stress test.  Only if she has high risk findings would any further evaluation be required.  She has a very low functional capacity and as multiple important comorbidities that will need to be weighed in regards to her management. Malignant breast cancer: Recurrent; being followed by other providers with planned surgical resection in the near future. Hyperlipidemia: Would likely benefit from statin but will defer for now given advanced age and oncologic issues.   Hypertension: Pressure is well-controlled today. History of stroke: Continue aspirin. Aortic atherosclerosis: See #3 above.        Informed Consent   Shared Decision Making/Informed Consent The risks [chest pain, shortness of breath, cardiac arrhythmias, dizziness, blood pressure fluctuations, myocardial infarction, stroke/transient ischemic attack, nausea, vomiting, allergic reaction, radiation exposure, metallic taste sensation and life-threatening complications (estimated to be 1 in 10,000)], benefits (risk stratification, diagnosing  coronary artery disease, treatment guidance) and alternatives of a cardiac PET stress test were discussed in detail with Kirsten Mcmahon and she agrees to proceed.      Dispo:  No follow-ups on file.      I spent 32 minutes reviewing all clinical data during and prior to this visit including all relevant imaging studies, laboratories, clinical information from other health systems, and prior notes from both Cardiology and other specialties, interviewing the patient, and conducting a complete physical examination in order to formulate a comprehensive and personalized evaluation and treatment plan.   Medication Adjustments/Labs and Tests Ordered: Current medicines are reviewed at length with the patient today.  Concerns regarding medicines are outlined above.  The following changes have been made:  no change   Labs/tests ordered: No orders of the defined types were placed in this encounter.   Medication Changes: No orders of the defined types were placed in this encounter.   Current medicines are reviewed at length with the patient today.  The patient does not have concerns regarding medicines.  History of Present Illness:      FOCUSED PROBLEM LIST:   Breast cancer diagnosed 2016 Status postlumpectomy in 2016 and therapy with letrozole Recurrence 2024 Strep G bacteremia 2022 End-stage renal disease MWFri Hypertension Hyperlipidemia Multiple previous strokes with residual left hemiparesis Right MCA stroke 1998 Multifocal punctate strokes 2022 >>TEE negative  Seizure disorder Aortic atherosclerosis and three-vessel coronary artery calcification chest CT 2024 BMI 23 Frailty  October 2024: The 77 year old patient has had a long complicated medical history summarized above.  Most recently she was diagnosed with recurrent breast cancer and is here for preoperative cardiac evaluation prior to noncardiac surgery.  The patient is here with her family.  She just finished  dialysis.   Dialysis has been going well.  She has been on dialysis for about 3 years.  The patient is very limited.  She goes to a physical therapy program every day.  She does not really ambulate well on her own.  She has residual left upper extremity weakness.  Her family tells me she could walk better if she had a cane.  Most the time at home she sits and does not do much.  She does not really do anything around the house.  She fortunately denies any significant shortness of breath or chest pain with any of the low-level activity that she does do.  She tells me she can go up and down stairs but this takes quite a while.  She denies any presyncope or syncope.  She denies any problems with recurrent stroke or issues with dialysis.  She has had no severe bleeding or bruising.  Plan: Obtain PET stress test and echocardiogram.  November 2024: An echocardiogram demonstrated preserved function with no significant valvular abnormalities.  Patient was scheduled for PET stress test however she presented with a seizure to the emergency department.  Her evaluation was negative.  It was thought that the missed doses of her Vimpat seizure medication was the etiology for this presentation.  Her PET stress test was canceled and she was scheduled here for follow-up.  She is doing well.  She has had no recurrent seizures since she was discharged from the hospital.  She had a full dialysis session yesterday without any issues.  She denies any lightheadedness or chest pain.         Current Medications: Current Meds  Medication Sig   acetaminophen (TYLENOL) 500 MG tablet Take 1,000 mg by mouth daily.   anastrozole (ARIMIDEX) 1 MG tablet Take 1 tablet (1 mg total) by mouth daily.   aspirin 81 MG chewable tablet Chew 1 tablet (81 mg total) by mouth daily.   bisacodyl (DULCOLAX) 5 MG EC tablet Take 1 tablet (5 mg total) by mouth daily as needed for moderate constipation.   calcitRIOL (ROCALTROL) 0.5 MCG capsule Take 1 capsule (0.5  mcg total) by mouth every Monday, Wednesday, and Friday with hemodialysis.   chlorhexidine gluconate, MEDLINE KIT, (PERIDEX) 0.12 % solution 15 mLs by Mouth Rinse route 2 (two) times daily.   Cinacalcet HCl (SENSIPAR PO) Take by mouth.   dextrose (GLUTOSE) 40 % GEL Place 28 mLs inside cheek as needed (for suspected low sugars).   diazePAM, 15 MG Dose, (VALTOCO 15 MG DOSE) 2 x 7.5 MG/0.1ML LQPK Place 15 mg into the nose once as needed for up to 1 dose (for seizure lasting more than 2 minutes.).   docusate sodium (COLACE) 100 MG capsule Take 1 capsule (100 mg total) by mouth 2 (two) times daily.   donepezil (ARICEPT) 10 MG tablet Take 10 mg by mouth daily.   famotidine (PEPCID) 10 MG tablet Take 10 mg by mouth daily.   lacosamide 150 MG TABS Take 1 tablet (150 mg total) by mouth 2 (two) times daily.   latanoprost (XALATAN) 0.005 % ophthalmic solution Place 1 drop into both eyes at bedtime.   levothyroxine (SYNTHROID, LEVOTHROID) 112 MCG tablet Take 1 tablet (112 mcg total) by mouth daily before breakfast.   lidocaine-prilocaine (EMLA) cream Apply 1 application. topically 3 (three) times a week.   Methoxy PEG-Epoetin Beta (MIRCERA IJ) Mircera   metoprolol succinate (TOPROL-XL) 25 MG 24 hr tablet Take 1 tablet (25 mg total) by mouth daily.  multivitamin (RENA-VIT) TABS tablet Take 1 tablet by mouth at bedtime.   Nutritional Supplements (,FEEDING SUPPLEMENT, PROSOURCE PLUS) liquid Take 30 mLs by mouth 3 (three) times daily between meals.   sucroferric oxyhydroxide (VELPHORO) 500 MG chewable tablet Chew 500 mg by mouth 3 (three) times daily with meals.     Review of Systems:   Please see the history of present illness.    All other systems reviewed and are negative.     EKGs/Labs/Other Test Reviewed:   EKG: EKG performed June 2024 demonstrates sinus rhythm with anterior infarction pattern  EKG Interpretation Date/Time:    Ventricular Rate:    PR Interval:    QRS Duration:    QT Interval:     QTC Calculation:   R Axis:      Text Interpretation:           Risk Assessment/Calculations:          Physical Exam:   VS:  BP 100/66 (BP Location: Left Arm, Patient Position: Sitting, Cuff Size: Normal)   Pulse 94   Resp 16   Ht 5\' 2"  (1.575 m)   Wt 133 lb (60.3 kg)   SpO2 95%   BMI 24.33 kg/m        Wt Readings from Last 3 Encounters:  10/13/23 133 lb (60.3 kg)  09/14/23 128 lb (58.1 kg)  08/24/23 130 lb (59 kg)      GENERAL:  No apparent distress, AOx3 HEENT:  No carotid bruits, +2 carotid impulses, no scleral icterus CAR: RRR no murmurs, gallops, rubs, or thrills RES:  Clear to auscultation bilaterally ABD:  Soft, nontender, nondistended, positive bowel sounds x 4 VASC:  +2 radial pulses, +2 carotid pulses NEURO:  CN 2-12 grossly intact; left sided weakness  PSYCH:  No active depression or anxiety EXT:  No edema, ecchymosis, or cyanosis: Right upper extremity fistula  Signed, Orbie Pyo, MD  10/13/2023 10:52 AM    [facility] [address] [place], [place], [place]  [address] Phone: [phone]; Fax: [phone]   Note:  This document was prepared using Dragon voice recognition software and may include unintentional dictation errors.

## 2023-10-13 ENCOUNTER — Ambulatory Visit: Payer: Medicare (Managed Care) | Attending: Internal Medicine | Admitting: Internal Medicine

## 2023-10-13 ENCOUNTER — Encounter (HOSPITAL_COMMUNITY): Payer: Self-pay

## 2023-10-13 ENCOUNTER — Encounter: Payer: Self-pay | Admitting: Internal Medicine

## 2023-10-13 VITALS — BP 100/66 | HR 94 | Resp 16 | Ht 62.0 in | Wt 133.0 lb

## 2023-10-13 DIAGNOSIS — C50411 Malignant neoplasm of upper-outer quadrant of right female breast: Secondary | ICD-10-CM

## 2023-10-13 DIAGNOSIS — I10 Essential (primary) hypertension: Secondary | ICD-10-CM

## 2023-10-13 DIAGNOSIS — Z8673 Personal history of transient ischemic attack (TIA), and cerebral infarction without residual deficits: Secondary | ICD-10-CM

## 2023-10-13 DIAGNOSIS — E785 Hyperlipidemia, unspecified: Secondary | ICD-10-CM

## 2023-10-13 DIAGNOSIS — Z01810 Encounter for preprocedural cardiovascular examination: Secondary | ICD-10-CM

## 2023-10-13 DIAGNOSIS — I700 Atherosclerosis of aorta: Secondary | ICD-10-CM

## 2023-10-13 DIAGNOSIS — Z170 Estrogen receptor positive status [ER+]: Secondary | ICD-10-CM

## 2023-10-13 NOTE — Patient Instructions (Signed)
Medication Instructions:  No changes *If you need a refill on your cardiac medications before your next appointment, please call your pharmacy*   Lab Work: none If you have labs (blood work) drawn today and your tests are completely normal, you will receive your results only by: MyChart Message (if you have MyChart) OR A paper copy in the mail If you have any lab test that is abnormal or we need to change your treatment, we will call you to review the results.   Testing/Procedures: As planned   Follow-Up: As planned

## 2023-10-14 ENCOUNTER — Telehealth (HOSPITAL_COMMUNITY): Payer: Self-pay | Admitting: Emergency Medicine

## 2023-10-14 NOTE — Telephone Encounter (Signed)
Reaching out to patient to offer assistance regarding upcoming cardiac imaging study; pt verbalizes understanding of appt date/time, parking situation and where to check in, pre-test NPO status and medications ordered, and verified current allergies; name and call back number provided for further questions should they arise Cayne Yom RN Navigator Cardiac Imaging [facility] [phone] office [phone] cell 

## 2023-10-18 ENCOUNTER — Ambulatory Visit (HOSPITAL_BASED_OUTPATIENT_CLINIC_OR_DEPARTMENT_OTHER)
Admission: RE | Admit: 2023-10-18 | Discharge: 2023-10-18 | Disposition: A | Discharge status: Home / Self Care | Payer: Medicare (Managed Care) | Source: Ambulatory Visit | Attending: Internal Medicine | Admitting: Internal Medicine

## 2023-10-18 ENCOUNTER — Ambulatory Visit (HOSPITAL_COMMUNITY)
Admission: RE | Admit: 2023-10-18 | Discharge: 2023-10-18 | Disposition: A | Discharge status: Home / Self Care | Payer: Medicare (Managed Care) | Source: Ambulatory Visit | Attending: Internal Medicine | Admitting: Internal Medicine

## 2023-10-18 DIAGNOSIS — Z01810 Encounter for preprocedural cardiovascular examination: Secondary | ICD-10-CM | POA: Insufficient documentation

## 2023-10-18 LAB — NM PET CT CARDIAC PERFUSION MULTI W/ABSOLUTE BLOODFLOW
LV dias vol: 52 mL (ref 46–106)
MBFR: 2.02
Nuc Rest EF: 60 %
Nuc Stress EF: 66 %
Peak HR: 88 {beats}/min
Rest HR: 76 {beats}/min
Rest MBF: 1.21 ml/g/min
Rest Nuclear Isotope Dose: 16.1 mCi
Rest perfusion cavity size (mL): 52 mL
ST Depression (mm): 0 mm
Stress MBF: 2.45 ml/g/min
Stress Nuclear Isotope Dose: 16.1 mCi
Stress perfusion cavity size (mL): 50 mL
TID: 0.96

## 2023-10-18 MED ORDER — REGADENOSON 0.4 MG/5ML IV SOLN
INTRAVENOUS | Status: AC
  Filled 2023-10-18: qty 5

## 2023-10-18 MED ORDER — RUBIDIUM RB82 GENERATOR (RUBYFILL)
15.9600 | PACK | Freq: Once | INTRAVENOUS | Status: AC
  Administered 2023-10-18: 16.11 via INTRAVENOUS

## 2023-10-18 MED ORDER — REGADENOSON 0.4 MG/5ML IV SOLN
0.4000 mg | Freq: Once | INTRAVENOUS | Status: AC
  Administered 2023-10-18: 0.4 mg via INTRAVENOUS

## 2023-10-18 MED ORDER — RUBIDIUM RB82 GENERATOR (RUBYFILL)
15.9600 | PACK | Freq: Once | INTRAVENOUS | Status: AC
Start: 2023-10-18 — End: 2023-10-18
  Administered 2023-10-18: 16.13 via INTRAVENOUS

## 2023-10-19 ENCOUNTER — Other Ambulatory Visit (HOSPITAL_COMMUNITY): Payer: Medicare (Managed Care)

## 2023-10-20 ENCOUNTER — Encounter: Payer: Self-pay | Admitting: *Deleted

## 2023-10-21 ENCOUNTER — Telehealth: Payer: Self-pay | Admitting: *Deleted

## 2023-10-21 NOTE — Telephone Encounter (Signed)
Received call from pt son [name] stating pt has passed cardiac testing and they would like to proceed with surgery as soon as possible.  RN sent inbasket to surgical team to reach out to pt and family.

## 2023-10-25 ENCOUNTER — Ambulatory Visit: Payer: Self-pay | Admitting: General Surgery

## 2023-10-25 ENCOUNTER — Telehealth: Payer: Self-pay

## 2023-10-25 NOTE — Telephone Encounter (Signed)
Pt's son, [name] has called me twice today, eager to have pt's surgery scheduled. I did leave him a voicemail advising him to call [facility] since I am not able to schedule the patient for this. Message sent to nurse navigators and surgical team asking to reach out to [facility], as he feels his mother has been "forgotten" and he verbalized concern in his voicemail to me that her cancer will spread without scheduling surgery ASAP.  Left pt's son a voicemail to call back.

## 2023-10-25 NOTE — Telephone Encounter (Signed)
Pt's son called and LVM concerned about why pt's surgery has not been scheduled yet. Per previous note from [place], [place], message was sent to surgical team to get pt scheduled. Attempted to call [name] back to further discuss. LVM for call back.

## 2023-10-26 ENCOUNTER — Telehealth: Payer: Self-pay | Admitting: *Deleted

## 2023-10-26 ENCOUNTER — Telehealth: Payer: Self-pay | Admitting: Neurology

## 2023-10-26 NOTE — Telephone Encounter (Signed)
She is not established in our practice, see Referral notes. Annabelle Harman, pls call son about options, thanks

## 2023-10-26 NOTE — Telephone Encounter (Signed)
Received a message from imaging navigator that patient's son called saying that the surgeon needed more information re: patient being cleared for surgery.  The NM PET stress test was sent to them, with the following comment attached to it:  "Let her surgeon's office know she is at low risk for perioperative CV  complication given low risk stress test."   Called and left a message for Dawn, patient's surgery scheduler at [facility] tp call me back to inquire about what more is needed.

## 2023-10-26 NOTE — Telephone Encounter (Signed)
Patient is sch and on the wait list pt son is aware

## 2023-10-26 NOTE — Telephone Encounter (Signed)
Patients son called asking about his mothers appt. She is to have surgery regarding cancer and she needs clearance from [facility]. Does she need an appt with Karel Jarvis ASAP?  She missed her last appt in 07/2023 but he wasn't aware of that. He doesn't know what to do next so his mother can have this surgery so the cancer doesn't spread. Please call him asap

## 2023-11-14 ENCOUNTER — Encounter: Payer: Self-pay | Admitting: *Deleted

## 2023-11-18 ENCOUNTER — Telehealth: Payer: Self-pay | Admitting: Hematology and Oncology

## 2023-11-25 ENCOUNTER — Encounter: Payer: Self-pay | Admitting: Neurology

## 2023-11-25 ENCOUNTER — Ambulatory Visit (INDEPENDENT_AMBULATORY_CARE_PROVIDER_SITE_OTHER): Payer: Medicare (Managed Care) | Admitting: Neurology

## 2023-11-25 VITALS — BP 116/70 | HR 56 | Ht 64.0 in | Wt 130.0 lb

## 2023-11-25 DIAGNOSIS — Z8673 Personal history of transient ischemic attack (TIA), and cerebral infarction without residual deficits: Secondary | ICD-10-CM | POA: Diagnosis not present

## 2023-11-25 DIAGNOSIS — G40209 Localization-related (focal) (partial) symptomatic epilepsy and epileptic syndromes with complex partial seizures, not intractable, without status epilepticus: Secondary | ICD-10-CM | POA: Diagnosis not present

## 2023-11-25 DIAGNOSIS — F0153 Vascular dementia, unspecified severity, with mood disturbance: Secondary | ICD-10-CM

## 2023-11-25 NOTE — Patient Instructions (Signed)
Good to meet you.  Continue Vimpat (Lacosamide) 150mg  twice a day.   2. Continue aspirin, control of blood pressure, cholesterol, sugar levels  3. Follow-up in 6 months, call for any changes   Seizure Precautions: 1. If medication has been prescribed for you to prevent seizures, take it exactly as directed.  Do not stop taking the medicine without talking to your doctor first, even if you have not had a seizure in a long time.   2. Avoid activities in which a seizure would cause danger to yourself or to others.  Don't operate dangerous machinery, swim alone, or climb in high or dangerous places, such as on ladders, roofs, or girders.  Do not drive unless your doctor says you may.  3. If you have any warning that you may have a seizure, lay down in a safe place where you can't hurt yourself.    4.  No driving for 6 months from last seizure, as per [facility].   Please refer to the following link on the [employer]ile[employer] of [employer]'s website for more information: http://www.epilepsyfoundation.org/answerplace/Social/driving/drivingu.cfm   5.  Maintain good sleep hygiene.  6.  Contact your doctor if you have any problems that may be related to the medicine you are taking.  7.  Call 911 and bring the patient back to the ED if:        A.  The seizure lasts longer than 5 minutes.       B.  The patient doesn't awaken shortly after the seizure  C.  The patient has new problems such as difficulty seeing, speaking or moving  D.  The patient was injured during the seizure  E.  The patient has a temperature over 102 F (39C)  F.  The patient vomited and now is having trouble breathing

## 2023-11-25 NOTE — Progress Notes (Unsigned)
NEUROLOGY CONSULTATION NOTE  Kirsten Mcmahon MRN: [id number] DOB: 07/15/1946  Referring provider: Dr. Chevis Pretty III Primary care provider: [facility]  Reason for consult:    Dear Dr ***:  Thank you for your kind referral of Kirsten Mcmahon for consultation of the above symptoms. Although *** history is well known to you, please allow me to reiterate it for the purpose of our medical record. The patient was accompanied to the clinic by *** who also provides collateral information. Records and images were personally reviewed where available.  HISTORY OF PRESENT ILLNESS: ***.  RH Lives with son Hilbert Bible DIL Lived with them for 3 yrs since the stroke Let UE and LE weak I'm havin gproblems seeing, no diff swallowing Dizzy all the time I can feel them, I just feel dizzy Does not have sz at home; michelle has not seen them Son had seen 13yrs ago had stroke, I dont think he has seen the sz No staring, jerking Son gives her meds and she takes them Dementia since the stroke Was living with dtr previously Sz sx: - Mostly in chair at home Cries a lot at home Cries in office  0/5 on left UE, knows year, location, thumb inside fist; left foot inverted 3/5 Wordle; spelled it correctly after, but not backwards Dec cold on left UE 1/3  Head CT 10/2023: Redemonstrated sequela of right frontal craniectomy with subjacent encephalomalacia in the right MCA territory. Remote infarcts in the right occipital lobe and bilateral cerebellar hemispheres. Sz during HD again  been having multiple seizures with right gaze deviation while in the dialysis unit which has led to incomplete dialysis sessions and is hindering her medical care.  Case was discussed with me over the phone yesterday.  I recommended transfer to [facility] for LTM EEG to characterize the spells because the description of the spells was not very clear and was described as generalized seizure  only. Given her during renal function and somewhat altered mental status, I would be hesitant to increase any medication without further workup with a long-term EEG."   She had been seen 5 days earlier by Neurology after a spell of unresponsiveness, also at dialysis, which was associated with right gaze deviation. Son at that time felt that she may have had too much fluid pulled off during dialysis, also commenting that she does not drink enoght water. It was unclear what her BP was at the time of that event. EMS did not witness any seizure activity. It was felt at that time that she had experienced recrudescence of her prior stroke symptoms during dialysis, with transient hypotension suspected as the underlying etiology.    05/2023:right hemispheric strokes and subsequent left hemiplegia, epilepsy on Vimpat, end-stage renal disease on HD who presented with increased seizure like episodes during dialysis   Epilepsy with breakthrough seizure -No clear etiology.   Recommendations -Continue Vimpat 150 mg twice daily.  - Rescue medications: Intranasal valtoco 15mg  for seizure lasting over 2 minutes.  Spike, right>left frontotemporal region. -Breach artifact, right centro-parietal region -Continuous slow, generalized and lateralized right hemisphere  Seizure symptoms: The patient denies any olfactory/gustatory hallucinations, deja vu, rising epigastric sensation, focal numbness/tingling/weakness, myoclonic jerks.  Epilepsy Risk Factors:  *** had a normal birth and early development.  There is no history of febrile convulsions, CNS infections such as meningitis/encephalitis, significant traumatic brain injury, neurosurgical procedures, or family history of seizures.  Prior AEDs: Laboratory Data:  EEGs: MRI:  PAST MEDICAL HISTORY: Past Medical History:  Diagnosis Date   Cancer (HCC) 2017   Right breast   Chronic kidney disease    progression to ESRD 04/10/21   GERD (gastroesophageal reflux  disease)    Glaucoma    Hemiparesis (HCC)    left side   High cholesterol    History of seizure    after a spider bite; 07/15/21   History of stroke with residual deficit    left-side weakness   Hypertension    states BP under control with meds., has been on med. x 2 yr.   Hypothyroidism    Non-insulin dependent type 2 diabetes mellitus (HCC)    Overactive bladder    PFO (patent foramen ovale) 05/15/2021   Stroke (HCC)    1998 weakness on left side; 05/12/21    PAST SURGICAL HISTORY: Past Surgical History:  Procedure Laterality Date   ABDOMINAL HYSTERECTOMY     complete   AV FISTULA PLACEMENT Right 09/29/2021   Procedure: INSERTION OF RIGHT ARM ARTERIOVENOUS (AV) GORE-TEX GRAFT;  Surgeon: Chuck Hint, MD;  Location: [facility];  Service: Vascular;  Laterality: Right;   BREAST BIOPSY Left 08/03/2018   Benign adipose tissue   BREAST BIOPSY Right 02/15/2023   [place] RT BREAST BX W LOC DEV 1ST LESION IMG BX SPEC US GUIDE 02/15/2023 [facility]   BREAST BIOPSY Left 02/15/2023   [place] LT BREAST BX W LOC DEV 1ST LESION IMG BX SPEC US GUIDE 02/15/2023 [facility]   BREAST EXCISIONAL BIOPSY Right 2014   Positive   BREAST LUMPECTOMY Right    BUBBLE STUDY  05/15/2021   Procedure: BUBBLE STUDY;  Surgeon: Jake Bathe, MD;  Location: [facility];  Service: Cardiovascular;;   CATARACT EXTRACTION W/ INTRAOCULAR LENS IMPLANT Left    CEREBRAL ANEURYSM REPAIR  1998   DIALYSIS/PERMA CATHETER INSERTION N/A 04/10/2021   Procedure: DIALYSIS/PERMA CATHETER INSERTION;  Surgeon: Annice Needy, MD;  Location: [facility] INVASIVE CV LAB;  Service: Cardiovascular;  Laterality: N/A;   IR FLUORO GUIDE CV LINE RIGHT  04/30/2021   PICC LINE INSERTION     TEE WITHOUT CARDIOVERSION N/A 05/15/2021   Procedure: TRANSESOPHAGEAL ECHOCARDIOGRAM (TEE);  Surgeon: Jake Bathe, MD;  Location: [facility] ENDOSCOPY;  Service: Cardiovascular;  Laterality: N/A;   THYROID LOBECTOMY Right 07/15/2017   Procedure: RIGHT THYROID  LOBECTOMY;  Surgeon: Darnell Level, MD;  Location: [facility] OR;  Service: General;  Laterality: Right;    MEDICATIONS: Current Outpatient Medications on File Prior to Visit  Medication Sig Dispense Refill   acetaminophen (TYLENOL) 500 MG tablet Take 1,000 mg by mouth daily.     anastrozole (ARIMIDEX) 1 MG tablet Take 1 tablet (1 mg total) by mouth daily. 90 tablet 3   aspirin EC 81 MG tablet Take 81 mg by mouth daily. Swallow whole.     calcitRIOL (ROCALTROL) 0.5 MCG capsule Take 1 capsule (0.5 mcg total) by mouth every Monday, Wednesday, and Friday with hemodialysis. 30 capsule 0   diazePAM, 15 MG Dose, (VALTOCO 15 MG DOSE) 2 x 7.5 MG/0.1ML LQPK Place 15 mg into the nose once as needed for up to 1 dose (for seizure lasting more than 2 minutes.). 2 each 0   docusate sodium (COLACE) 100 MG capsule Take 1 capsule (100 mg total) by mouth 2 (two) times daily. 20 capsule 0   donepezil (ARICEPT) 10 MG tablet Take 10 mg by mouth daily at 12 noon.     famotidine (PEPCID) 10 MG tablet Take  10 mg by mouth in the morning.     lacosamide 150 MG TABS Take 1 tablet (150 mg total) by mouth 2 (two) times daily. 60 tablet 0   latanoprost (XALATAN) 0.005 % ophthalmic solution Place 1 drop into both eyes at bedtime. 2.5 mL 0   levothyroxine (SYNTHROID, LEVOTHROID) 112 MCG tablet Take 1 tablet (112 mcg total) by mouth daily before breakfast. 90 tablet 0   Methoxy PEG-Epoetin Beta (MIRCERA IJ) Mircera     metoprolol succinate (TOPROL-XL) 25 MG 24 hr tablet Take 1 tablet (25 mg total) by mouth daily. 30 tablet 0   sucroferric oxyhydroxide (VELPHORO) 500 MG chewable tablet Chew 500 mg by mouth 3 (three) times daily with meals.     No current facility-administered medications on file prior to visit.    ALLERGIES: Allergies  Allergen Reactions   Contrast Media [Iodinated Contrast Media] Swelling   Latex Itching   Shellfish-Derived Products Other (See Comments)    Other reaction(s): NO ALLERGY   Levemir [Insulin  Detemir] Itching    FAMILY HISTORY: Family History  Problem Relation Age of Onset   Cancer Brother        possible prostate cancer per her daughter   Breast cancer Neg Hx     SOCIAL HISTORY: Social History   Socioeconomic History   Marital status: Single    Spouse name: Not on file   Number of children: Not on file   Years of education: Not on file   Highest education level: Not on file  Occupational History   Not on file  Tobacco Use   Smoking status: Never   Smokeless tobacco: Never  Vaping Use   Vaping status: Never Used  Substance and Sexual Activity   Alcohol use: No   Drug use: No   Sexual activity: Never    Birth control/protection: Post-menopausal  Other Topics Concern   Not on file  Social History Narrative   Lives with son and wife   Right handed    Can not use right arm for BP    Social Drivers of Corporate investment banker Strain: Not on file  Food Insecurity: No Food Insecurity (05/31/2023)   Hunger Vital Sign    Worried About Running Out of Food in the Last Year: Never true    Ran Out of Food in the Last Year: Never true  Transportation Needs: No Transportation Needs (05/31/2023)   PRAPARE - Administrator, Civil Service (Medical): No    Lack of Transportation (Non-Medical): No  Physical Activity: Not on file  Stress: Not on file  Social Connections: Not on file  Intimate Partner Violence: Not At Risk (05/31/2023)   Humiliation, Afraid, Rape, and Kick questionnaire    Fear of Current or Ex-Partner: No    Emotionally Abused: No    Physically Abused: No    Sexually Abused: No     PHYSICAL EXAM: Vitals:   11/25/23 1034  BP: 116/70  Pulse: (!) 56  SpO2: 97%   General: No acute distress Head:  Normocephalic/atraumatic Skin/Extremities: No rash, no edema Neurological Exam: Mental status: alert and oriented to person, place, and time, no dysarthria or aphasia, Fund of knowledge is appropriate.  Recent and remote memory are  intact.  Attention and concentration are normal.    Able to name objects and repeat phrases. Cranial nerves: CN I: not tested CN II: pupils equal, round and reactive to light, visual fields intact CN III, IV, VI:  full range  of motion, no nystagmus, no ptosis CN V: facial sensation intact CN VII: upper and lower face symmetric CN VIII: hearing intact to conversation Bulk & Tone: normal, no fasciculations. Motor: 5/5 throughout with no pronator drift. Sensation: intact to light touch, cold, pin, vibration and joint position sense.  No extinction to double simultaneous stimulation.  Romberg test *** Deep Tendon Reflexes: +2 throughout, no ankle clonus Plantar responses: downgoing bilaterally Cerebellar: no incoordination on finger to nose, heel to shin. No dysdiadochokinesia Gait: narrow-based and steady, able to tandem walk adequately. Tremor: ***   IMPRESSION: This is a *** year old ***-handed *** with a history of ***.  [place] driving laws were discussed with the patient, and *** knows to stop driving after a seizure, until 6 months seizure-free.    The duration of this appointment visit was *** minutes of face-to-face time with the patient.  Greater than 50% of this time was spent in counseling, explanation of diagnosis, planning of further management, and coordination of care.  Thank you for allowing me to participate in the care of this patient. Please do not hesitate to call for any questions or concerns.   Patrcia Dolly, M.D.  CC: ***

## 2023-11-29 ENCOUNTER — Ambulatory Visit: Payer: Medicare (Managed Care) | Admitting: Neurology

## 2023-12-05 NOTE — Patient Instructions (Signed)
 DUE TO COVID-19 ONLY TWO VISITORS  (aged 79 and older)  ARE ALLOWED TO COME WITH YOU AND STAY IN THE WAITING ROOM ONLY DURING PRE OP AND PROCEDURE.   **NO VISITORS ARE ALLOWED IN THE SHORT STAY AREA OR RECOVERY ROOM!!**  IF YOU WILL BE ADMITTED INTO THE HOSPITAL YOU ARE ALLOWED ONLY FOUR SUPPORT PEOPLE DURING VISITATION HOURS ONLY (7 AM -8PM)   The support person(s) must pass our screening, gel in and out, and wear a mask at all times, including in the patient's room. Patients must also wear a mask when staff or their support person are in the room. Visitors GUEST BADGE MUST BE WORN VISIBLY  One adult visitor may remain with you overnight and MUST be in the room by 8 P.M.     Your procedure is scheduled on: 12/08/23   Report to [facility] Main Entrance    Report to admitting at : 6:15 AM   Call this number if you have problems the morning of surgery [phone]   Do not eat food or drink fluids :After Midnight.  FOLLOW ANY ADDITIONAL PRE OP INSTRUCTIONS YOU RECEIVED FROM YOUR SURGEON'S OFFICE!!!   Oral Hygiene is also important to reduce your risk of infection.                                    Remember - BRUSH YOUR TEETH THE MORNING OF SURGERY WITH YOUR REGULAR TOOTHPASTE  DENTURES WILL BE REMOVED PRIOR TO SURGERY PLEASE DO NOT APPLY Poly grip OR ADHESIVES!!!   Do NOT smoke after Midnight   Take these medicines the morning of surgery with A SIP OF WATER : lacosamide ,anastrozole ,metoprolol ,levothyroxine ,famotidine .Tylenol  as needed.  DO NOT TAKE ANY ORAL DIABETIC MEDICATIONS DAY OF YOUR SURGERY                              You may not have any metal on your body including hair pins, jewelry, and body piercing             Do not wear make-up, lotions, powders, perfumes/cologne, or deodorant  Do not wear nail polish including gel and S&S, artificial/acrylic nails, or any other type of covering on natural nails including finger and toenails. If you have artificial nails,  gel coating, etc. that needs to be removed by a nail salon please have this removed prior to surgery or surgery may need to be canceled/ delayed if the surgeon/ anesthesia feels like they are unable to be safely monitored.   Do not shave  48 hours prior to surgery.    Do not bring valuables to the hospital. [place] IS NOT             RESPONSIBLE   FOR VALUABLES.   Contacts, glasses, or bridgework may not be worn into surgery.   Bring small overnight bag day of surgery.   DO NOT BRING YOUR HOME MEDICATIONS TO THE HOSPITAL. PHARMACY WILL DISPENSE MEDICATIONS LISTED ON YOUR MEDICATION LIST TO YOU DURING YOUR ADMISSION IN THE HOSPITAL!    Patients discharged on the day of surgery will not be allowed to drive home.  Someone NEEDS to stay with you for the first 24 hours after anesthesia.   Special Instructions: Bring a copy of your healthcare power of attorney and living will documents         the day of  surgery if you haven't scanned them before.              Please read over the following fact sheets you were given: IF YOU HAVE QUESTIONS ABOUT YOUR PRE-OP INSTRUCTIONS PLEASE CALL [phone]    [facility] - Preparing for Surgery Before surgery, you can play an important role.  Because skin is not sterile, your skin needs to be as free of germs as possible.  You can reduce the number of germs on your skin by washing with CHG (chlorahexidine gluconate) soap before surgery.  CHG is an antiseptic cleaner which kills germs and bonds with the skin to continue killing germs even after washing. Please DO NOT use if you have an allergy to CHG or antibacterial soaps.  If your skin becomes reddened/irritated stop using the CHG and inform your nurse when you arrive at Short Stay. Do not shave (including legs and underarms) for at least 48 hours prior to the first CHG shower.  You may shave your face/neck. Please follow these instructions carefully:  1.  Shower with CHG Soap the night before surgery  and the  morning of Surgery.  2.  If you choose to wash your hair, wash your hair first as usual with your  normal  shampoo.  3.  After you shampoo, rinse your hair and body thoroughly to remove the  shampoo.                           4.  Use CHG as you would any other liquid soap.  You can apply chg directly  to the skin and wash                       Gently with a scrungie or clean washcloth.  5.  Apply the CHG Soap to your body ONLY FROM THE NECK DOWN.   Do not use on face/ open                           Wound or open sores. Avoid contact with eyes, ears mouth and genitals (private parts).                       Wash face,  Genitals (private parts) with your normal soap.             6.  Wash thoroughly, paying special attention to the area where your surgery  will be performed.  7.  Thoroughly rinse your body with warm water  from the neck down.  8.  DO NOT shower/wash with your normal soap after using and rinsing off  the CHG Soap.                9.  Pat yourself dry with a clean towel.            10.  Wear clean pajamas.            11.  Place clean sheets on your bed the night of your first shower and do not  sleep with pets. Day of Surgery : Do not apply any lotions/deodorants the morning of surgery.  Please wear clean clothes to the hospital/surgery center.  FAILURE TO FOLLOW THESE INSTRUCTIONS MAY RESULT IN THE CANCELLATION OF YOUR SURGERY PATIENT SIGNATURE_________________________________  NURSE SIGNATURE__________________________________  ________________________________________________________________________

## 2023-12-06 ENCOUNTER — Encounter (HOSPITAL_COMMUNITY): Payer: Self-pay

## 2023-12-06 ENCOUNTER — Emergency Department (HOSPITAL_COMMUNITY): Admission: EM | Admit: 2023-12-06 | Payer: 59 | Source: Home / Self Care

## 2023-12-06 ENCOUNTER — Other Ambulatory Visit: Payer: Self-pay

## 2023-12-06 ENCOUNTER — Encounter (HOSPITAL_COMMUNITY)
Admission: RE | Admit: 2023-12-06 | Discharge: 2023-12-06 | Disposition: A | Discharge status: Home / Self Care | Payer: 59 | Source: Ambulatory Visit | Attending: General Surgery | Admitting: General Surgery

## 2023-12-06 VITALS — BP 105/69 | HR 88 | Temp 97.8°F | Ht 64.0 in | Wt 120.0 lb

## 2023-12-06 DIAGNOSIS — Z7984 Long term (current) use of oral hypoglycemic drugs: Secondary | ICD-10-CM | POA: Insufficient documentation

## 2023-12-06 DIAGNOSIS — Z01818 Encounter for other preprocedural examination: Secondary | ICD-10-CM | POA: Insufficient documentation

## 2023-12-06 DIAGNOSIS — Z8673 Personal history of transient ischemic attack (TIA), and cerebral infarction without residual deficits: Secondary | ICD-10-CM | POA: Diagnosis not present

## 2023-12-06 DIAGNOSIS — E1122 Type 2 diabetes mellitus with diabetic chronic kidney disease: Secondary | ICD-10-CM | POA: Diagnosis not present

## 2023-12-06 DIAGNOSIS — I120 Hypertensive chronic kidney disease with stage 5 chronic kidney disease or end stage renal disease: Secondary | ICD-10-CM | POA: Diagnosis not present

## 2023-12-06 DIAGNOSIS — I10 Essential (primary) hypertension: Secondary | ICD-10-CM

## 2023-12-06 DIAGNOSIS — E039 Hypothyroidism, unspecified: Secondary | ICD-10-CM | POA: Insufficient documentation

## 2023-12-06 DIAGNOSIS — E1151 Type 2 diabetes mellitus with diabetic peripheral angiopathy without gangrene: Secondary | ICD-10-CM | POA: Insufficient documentation

## 2023-12-06 DIAGNOSIS — C50911 Malignant neoplasm of unspecified site of right female breast: Secondary | ICD-10-CM | POA: Diagnosis not present

## 2023-12-06 DIAGNOSIS — N186 End stage renal disease: Secondary | ICD-10-CM | POA: Diagnosis not present

## 2023-12-06 DIAGNOSIS — Z992 Dependence on renal dialysis: Secondary | ICD-10-CM | POA: Diagnosis not present

## 2023-12-06 HISTORY — DX: Anemia, unspecified: D64.9

## 2023-12-06 LAB — HEMOGLOBIN A1C
Hgb A1c MFr Bld: 6.2 % — ABNORMAL HIGH (ref 4.8–5.6)
Mean Plasma Glucose: 131 mg/dL

## 2023-12-06 LAB — GLUCOSE, CAPILLARY: Glucose-Capillary: 88 mg/dL (ref 70–99)

## 2023-12-06 LAB — CBC
HCT: 32.4 % — ABNORMAL LOW (ref 36.0–46.0)
Hemoglobin: 10.1 g/dL — ABNORMAL LOW (ref 12.0–15.0)
MCH: 32.6 pg (ref 26.0–34.0)
MCHC: 31.2 g/dL (ref 30.0–36.0)
MCV: 104.5 fL — ABNORMAL HIGH (ref 80.0–100.0)
Platelets: 269 10*3/uL (ref 150–400)
RBC: 3.1 MIL/uL — ABNORMAL LOW (ref 3.87–5.11)
RDW: 13.6 % (ref 11.5–15.5)
WBC: 10 10*3/uL (ref 4.0–10.5)
nRBC: 0 % (ref 0.0–0.2)

## 2023-12-06 NOTE — Anesthesia Preprocedure Evaluation (Addendum)
 Anesthesia Evaluation  Patient identified by MRN, date of birth, ID band Patient awake    Reviewed: Allergy & Precautions, NPO status , Patient's Chart, lab work & pertinent test results, reviewed documented beta blocker date and time   Airway Mallampati: II  TM Distance: >3 FB     Dental  (+) Missing, Dental Advisory Given   Pulmonary pneumonia, resolved   Pulmonary exam normal breath sounds clear to auscultation       Cardiovascular hypertension, Pt. on medications and Pt. on home beta blockers + Peripheral Vascular Disease  Normal cardiovascular exam Rhythm:Regular Rate:Normal  Echo 10/05/23 1. Left ventricular ejection fraction, by estimation, is 60 to 65%. The  left ventricle has normal function. The left ventricle has no regional  wall motion abnormalities. Left ventricular diastolic parameters were  normal.   2. Right ventricular systolic function is normal. The right ventricular  size is normal. Tricuspid regurgitation signal is inadequate for assessing  PA pressure.   3. The mitral valve is degenerative. No evidence of mitral valve  regurgitation. No evidence of mitral stenosis.   4. The aortic valve is tricuspid. Aortic valve regurgitation is trivial.  No aortic stenosis is present.   5. There is dilatation of the ascending aorta.   6. The inferior vena cava is normal in size with greater than 50%  respiratory variability, suggesting right atrial pressure of 3 mmHg.   EKG 10/2023 NSR, Borderline short PR, old inferior infarct, consider anterolateral infarct    Neuro/Psych Seizures -, Well Controlled,  PSYCHIATRIC DISORDERS      Left hemiparesis/hemiplegia Glaucoma CVA, Residual Symptoms    GI/Hepatic Neg liver ROS,GERD  Medicated,,  Endo/Other  diabetes, Well Controlled, Type 2Hypothyroidism  Hyperlipidemia Secondary hyperparathyroidism Right Breast Ca  Renal/GU ESRF and DialysisRenal diseaseLast dialysis  12/29 Bladder dysfunction  OAB    Musculoskeletal negative musculoskeletal ROS (+)    Abdominal   Peds  Hematology  (+) Blood dyscrasia, anemia   Anesthesia Other Findings   Reproductive/Obstetrics                              Anesthesia Physical Anesthesia Plan  ASA: 4  Anesthesia Plan: General   Post-op Pain Management: Regional block* and Minimal or no pain anticipated   Induction: Intravenous and Cricoid pressure planned  PONV Risk Score and Plan: 4 or greater and Treatment may vary due to age or medical condition, Ondansetron  and TIVA  Airway Management Planned: LMA  Additional Equipment: None  Intra-op Plan:   Post-operative Plan: Extubation in OR  Informed Consent: I have reviewed the patients History and Physical, chart, labs and discussed the procedure including the risks, benefits and alternatives for the proposed anesthesia with the patient or authorized representative who has indicated his/her understanding and acceptance.     Dental advisory given  Plan Discussed with: CRNA and Anesthesiologist  Anesthesia Plan Comments: (See PAT note 12/06/2023)        Anesthesia Quick Evaluation

## 2023-12-06 NOTE — Progress Notes (Signed)
 Anesthesia Chart Review   Case: [id number] Date/Time: 12/08/23 0815   Procedure: RIGHT MODIFIED RADICAL MASTECTOMY (Right)   Anesthesia type: General   Pre-op diagnosis: RIGHT BREAST CANCER   Location: [facility] 04 / [facility]   Surgeons: Curvin Deward MOULD, MD       DISCUSSION:77 y.o. never smoker with h/o HTN, hypothyroidism, Stroke, DM II, ESRD on hemodialysis MWF, right breast cancer scheduled for above procedure 12/08/23.   Pt seen by cardiology 10/13/2023. Per OV note, Preoperative cardiovascular examination: Her echocardiogram was reassuring.  Her PET stress test was canceled due to presentation with a seizure a few days ago.  This is due to a missed dose of her seizure medication.  Will obtain PET stress test.  Only if she has high risk findings would any further evaluation be required.  She has a very low functional capacity and as multiple important comorbidities that will need to be weighed in regards to her management.  PET stress test 10/18/2023 with normal perfusion, no evidence of ischemia. Per Dr. Parry note on results, Let her surgeon's office know she is at low risk for perioperative CV  complication given low risk stress test.  VS: BP 105/69   Pulse 88   Temp 36.6 C (Oral)   Ht 5' 4 (1.626 m)   Wt 54.4 kg   SpO2 100%   BMI 20.60 kg/m   PROVIDERS: [facility]  Wendel Haws, MD is Cardiologist  LABS: Labs reviewed: Acceptable for surgery. (all labs ordered are listed, but only abnormal results are displayed)  Labs Reviewed  CBC - Abnormal; Notable for the following components:      Result Value   RBC 3.10 (*)    Hemoglobin 10.1 (*)    HCT 32.4 (*)    MCV 104.5 (*)    All other components within normal limits  GLUCOSE, CAPILLARY  HEMOGLOBIN A1C     IMAGES:   EKG:   CV: Echo 10/05/2023 1. Left ventricular ejection fraction, by estimation, is 60 to 65%. The  left ventricle has normal function. The left ventricle has no regional   wall motion abnormalities. Left ventricular diastolic parameters were  normal.   2. Right ventricular systolic function is normal. The right ventricular  size is normal. Tricuspid regurgitation signal is inadequate for assessing  PA pressure.   3. The mitral valve is degenerative. No evidence of mitral valve  regurgitation. No evidence of mitral stenosis.   4. The aortic valve is tricuspid. Aortic valve regurgitation is trivial.  No aortic stenosis is present.   5. There is dilatation of the ascending aorta.   6. The inferior vena cava is normal in size with greater than 50%  respiratory variability, suggesting right atrial pressure of 3 mmHg.  Past Medical History:  Diagnosis Date   Anemia    Cancer (HCC) 2017   Right breast   Chronic kidney disease    progression to ESRD 04/10/21   GERD (gastroesophageal reflux disease)    Glaucoma    Hemiparesis (HCC)    left side   High cholesterol    History of seizure    after a spider bite; 07/15/21   History of stroke with residual deficit    left-side weakness   Hypertension    states BP under control with meds., has been on med. x 2 yr.   Hypothyroidism    Non-insulin  dependent type 2 diabetes mellitus (HCC)    Overactive bladder    PFO (patent  foramen ovale) 05/15/2021   Stroke (HCC)    1998 weakness on left side; 05/12/21    Past Surgical History:  Procedure Laterality Date   ABDOMINAL HYSTERECTOMY     complete   AV FISTULA PLACEMENT Right 09/29/2021   Procedure: INSERTION OF RIGHT ARM ARTERIOVENOUS (AV) GORE-TEX GRAFT;  Surgeon: Eliza Lonni RAMAN, MD;  Location: [facility] OR;  Service: Vascular;  Laterality: Right;   BREAST BIOPSY Left 08/03/2018   Benign adipose tissue   BREAST BIOPSY Right 02/15/2023   US  RT BREAST BX W LOC DEV 1ST LESION IMG BX SPEC US  GUIDE 02/15/2023 [facility]   BREAST BIOPSY Left 02/15/2023   US  LT BREAST BX W LOC DEV 1ST LESION IMG BX SPEC US  GUIDE 02/15/2023 [facility]   BREAST EXCISIONAL  BIOPSY Right 2014   Positive   BREAST LUMPECTOMY Right    BUBBLE STUDY  05/15/2021   Procedure: BUBBLE STUDY;  Surgeon: Jeffrie Oneil BROCKS, MD;  Location: [facility] ENDOSCOPY;  Service: Cardiovascular;;   CATARACT EXTRACTION W/ INTRAOCULAR LENS IMPLANT Left    CEREBRAL ANEURYSM REPAIR  1998   DIALYSIS/PERMA CATHETER INSERTION N/A 04/10/2021   Procedure: DIALYSIS/PERMA CATHETER INSERTION;  Surgeon: Marea Selinda RAMAN, MD;  Location: [facility] INVASIVE CV LAB;  Service: Cardiovascular;  Laterality: N/A;   IR FLUORO GUIDE CV LINE RIGHT  04/30/2021   PICC LINE INSERTION     TEE WITHOUT CARDIOVERSION N/A 05/15/2021   Procedure: TRANSESOPHAGEAL ECHOCARDIOGRAM (TEE);  Surgeon: Jeffrie Oneil BROCKS, MD;  Location: [facility] ENDOSCOPY;  Service: Cardiovascular;  Laterality: N/A;   THYROID  LOBECTOMY Right 07/15/2017   Procedure: RIGHT THYROID  LOBECTOMY;  Surgeon: Eletha Boas, MD;  Location: [facility] OR;  Service: General;  Laterality: Right;    MEDICATIONS:  acetaminophen  (TYLENOL ) 500 MG tablet   anastrozole  (ARIMIDEX ) 1 MG tablet   aspirin  EC 81 MG tablet   calcitRIOL  (ROCALTROL ) 0.5 MCG capsule   diazePAM , 15 MG Dose, (VALTOCO  15 MG DOSE) 2 x 7.5 MG/0.1ML LQPK   docusate sodium  (COLACE) 100 MG capsule   donepezil  (ARICEPT ) 10 MG tablet   famotidine  (PEPCID ) 10 MG tablet   lacosamide  150 MG TABS   latanoprost  (XALATAN ) 0.005 % ophthalmic solution   levothyroxine  (SYNTHROID , LEVOTHROID) 112 MCG tablet   Methoxy PEG-Epoetin  Beta (MIRCERA IJ)   metoprolol  succinate (TOPROL -XL) 25 MG 24 hr tablet   sucroferric oxyhydroxide (VELPHORO ) 500 MG chewable tablet   No current facility-administered medications for this encounter.       Harlene Hoots Ward, PA-C [facility] Pre-Surgical Testing [phone]

## 2023-12-06 NOTE — Progress Notes (Addendum)
 For Anesthesia: PCP - [facility]  Cardiologist - Wendel, Arun K, MD. ARNETTA: 10/13/23 Neurologist: Dr. Darice Shivers. LOV: 11/25/23 Bowel Prep reminder:  Chest x-ray -  EKG - 12/06/23 Stress Test -  ECHO -  Cardiac Cath -  Pacemaker/ICD device last checked: Pacemaker orders received: Device Rep notified:  Spinal Cord Stimulator:  Sleep Study - N/A CPAP -   Fasting Blood Sugar - N/A Checks Blood Sugar ___0__ times a day Date and result of last Hgb A1c-  Last dose of GLP1 agonist- N/A GLP1 instructions:   Last dose of SGLT-2 inhibitors- N/A SGLT-2 instructions:   Blood Thinner Instructions: Aspirin  Instructions: will be hold 5 days before. Last Dose:  Activity level: Can go up a flight of stairs and activities of daily living without stopping and without chest pain and/or shortness of breath      Unable to go up a flight of stairs due to paraplegia.    Anesthesia review: Hx: PFO,CKD (Hemodialysis),Seizures (last on: 10/2023 at dialysis),Stroke,DIA,paraplegia.  Patient denies shortness of breath, fever, cough and chest pain at PAT appointment   Patient verbalized understanding of instructions that were given to them at the PAT appointment. Patient was also instructed that they will need to review over the PAT instructions again at home before surgery.

## 2023-12-07 ENCOUNTER — Encounter (HOSPITAL_COMMUNITY): Payer: Self-pay | Admitting: General Surgery

## 2023-12-08 ENCOUNTER — Ambulatory Visit (HOSPITAL_COMMUNITY)
Admission: RE | Admit: 2023-12-08 | Discharge: 2023-12-09 | Disposition: A | Discharge status: Home / Self Care | Payer: 59 | Attending: General Surgery | Admitting: General Surgery

## 2023-12-08 ENCOUNTER — Other Ambulatory Visit: Payer: Self-pay

## 2023-12-08 ENCOUNTER — Encounter (HOSPITAL_COMMUNITY): Payer: Self-pay | Admitting: General Surgery

## 2023-12-08 ENCOUNTER — Ambulatory Visit (HOSPITAL_BASED_OUTPATIENT_CLINIC_OR_DEPARTMENT_OTHER): Payer: 59 | Admitting: Anesthesiology

## 2023-12-08 ENCOUNTER — Encounter (HOSPITAL_COMMUNITY)
Admission: RE | Disposition: A | Discharge status: Home / Self Care | Payer: Self-pay | Source: Home / Self Care | Attending: General Surgery

## 2023-12-08 ENCOUNTER — Ambulatory Visit (HOSPITAL_COMMUNITY): Payer: 59 | Admitting: Physician Assistant

## 2023-12-08 DIAGNOSIS — Z170 Estrogen receptor positive status [ER+]: Secondary | ICD-10-CM | POA: Diagnosis not present

## 2023-12-08 DIAGNOSIS — K219 Gastro-esophageal reflux disease without esophagitis: Secondary | ICD-10-CM | POA: Insufficient documentation

## 2023-12-08 DIAGNOSIS — Z1721 Progesterone receptor positive status: Secondary | ICD-10-CM | POA: Insufficient documentation

## 2023-12-08 DIAGNOSIS — N186 End stage renal disease: Secondary | ICD-10-CM | POA: Insufficient documentation

## 2023-12-08 DIAGNOSIS — E1122 Type 2 diabetes mellitus with diabetic chronic kidney disease: Secondary | ICD-10-CM | POA: Diagnosis not present

## 2023-12-08 DIAGNOSIS — I120 Hypertensive chronic kidney disease with stage 5 chronic kidney disease or end stage renal disease: Secondary | ICD-10-CM | POA: Diagnosis not present

## 2023-12-08 DIAGNOSIS — C50911 Malignant neoplasm of unspecified site of right female breast: Secondary | ICD-10-CM | POA: Diagnosis present

## 2023-12-08 DIAGNOSIS — J189 Pneumonia, unspecified organism: Secondary | ICD-10-CM | POA: Diagnosis not present

## 2023-12-08 DIAGNOSIS — E1151 Type 2 diabetes mellitus with diabetic peripheral angiopathy without gangrene: Secondary | ICD-10-CM | POA: Insufficient documentation

## 2023-12-08 DIAGNOSIS — Z992 Dependence on renal dialysis: Secondary | ICD-10-CM | POA: Insufficient documentation

## 2023-12-08 DIAGNOSIS — I69354 Hemiplegia and hemiparesis following cerebral infarction affecting left non-dominant side: Secondary | ICD-10-CM | POA: Diagnosis not present

## 2023-12-08 DIAGNOSIS — I10 Essential (primary) hypertension: Secondary | ICD-10-CM

## 2023-12-08 DIAGNOSIS — C50411 Malignant neoplasm of upper-outer quadrant of right female breast: Secondary | ICD-10-CM | POA: Diagnosis not present

## 2023-12-08 DIAGNOSIS — C773 Secondary and unspecified malignant neoplasm of axilla and upper limb lymph nodes: Secondary | ICD-10-CM | POA: Insufficient documentation

## 2023-12-08 HISTORY — PX: MASTECTOMY MODIFIED RADICAL: SHX5962

## 2023-12-08 LAB — BASIC METABOLIC PANEL
Anion gap: 18 — ABNORMAL HIGH (ref 5–15)
BUN: 54 mg/dL — ABNORMAL HIGH (ref 8–23)
CO2: 25 mmol/L (ref 22–32)
Calcium: 9 mg/dL (ref 8.9–10.3)
Chloride: 95 mmol/L — ABNORMAL LOW (ref 98–111)
Creatinine, Ser: 12.03 mg/dL — ABNORMAL HIGH (ref 0.44–1.00)
GFR, Estimated: 3 mL/min — ABNORMAL LOW (ref >60)
Glucose, Bld: 121 mg/dL — ABNORMAL HIGH (ref 70–99)
Potassium: 3.9 mmol/L (ref 3.5–5.1)
Sodium: 138 mmol/L (ref 135–145)

## 2023-12-08 LAB — GLUCOSE, CAPILLARY: Glucose-Capillary: 125 mg/dL — ABNORMAL HIGH (ref 70–99)

## 2023-12-08 SURGERY — MASTECTOMY, MODIFIED RADICAL
Anesthesia: General | Laterality: Right

## 2023-12-08 MED ORDER — ACETAMINOPHEN 500 MG PO TABS
1000.0000 mg | ORAL_TABLET | ORAL | Status: AC
  Administered 2023-12-08: 1000 mg via ORAL
  Filled 2023-12-08: qty 2

## 2023-12-08 MED ORDER — LACTATED RINGERS IV SOLN: INTRAVENOUS | Status: DC

## 2023-12-08 MED ORDER — CHLORHEXIDINE GLUCONATE CLOTH 2 % EX PADS: 6.0000 | MEDICATED_PAD | Freq: Once | CUTANEOUS | Status: DC

## 2023-12-08 MED ORDER — ONDANSETRON HCL 4 MG/2ML IJ SOLN
4.0000 mg | Freq: Four times a day (QID) | INTRAMUSCULAR | Status: DC | PRN
Start: 2023-12-08 — End: 2023-12-09
  Administered 2023-12-08 (×2): 4 mg via INTRAVENOUS
  Filled 2023-12-08 (×2): qty 2

## 2023-12-08 MED ORDER — PHENYLEPHRINE HCL-NACL 20-0.9 MG/250ML-% IV SOLN
INTRAVENOUS | Status: DC | PRN
  Administered 2023-12-08: 40 ug/min via INTRAVENOUS

## 2023-12-08 MED ORDER — DONEPEZIL HCL 10 MG PO TABS
10.0000 mg | ORAL_TABLET | Freq: Every day | ORAL | Status: DC
  Administered 2023-12-08 – 2023-12-09 (×2): 10 mg via ORAL
  Filled 2023-12-08 (×2): qty 1

## 2023-12-08 MED ORDER — BUPIVACAINE HCL (PF) 0.5 % IJ SOLN
INTRAMUSCULAR | Status: DC | PRN
  Administered 2023-12-08: 15 mL via PERINEURAL

## 2023-12-08 MED ORDER — ENOXAPARIN SODIUM 30 MG/0.3ML IJ SOSY
30.0000 mg | PREFILLED_SYRINGE | INTRAMUSCULAR | Status: DC
  Administered 2023-12-09: 30 mg via SUBCUTANEOUS
  Filled 2023-12-08: qty 0.3

## 2023-12-08 MED ORDER — GABAPENTIN 100 MG PO CAPS
100.0000 mg | ORAL_CAPSULE | ORAL | Status: AC
  Administered 2023-12-08: 100 mg via ORAL
  Filled 2023-12-08: qty 1

## 2023-12-08 MED ORDER — PHENYLEPHRINE HCL (PRESSORS) 10 MG/ML IV SOLN
INTRAVENOUS | Status: AC
  Filled 2023-12-08: qty 1

## 2023-12-08 MED ORDER — SODIUM CHLORIDE 0.9% FLUSH: 3.0000 mL | INTRAVENOUS | Status: DC | PRN

## 2023-12-08 MED ORDER — ONDANSETRON 4 MG PO TBDP: 4.0000 mg | ORAL_TABLET | Freq: Four times a day (QID) | ORAL | Status: DC | PRN

## 2023-12-08 MED ORDER — LEVOTHYROXINE SODIUM 112 MCG PO TABS
112.0000 ug | ORAL_TABLET | Freq: Every day | ORAL | Status: DC
  Administered 2023-12-09: 112 ug via ORAL
  Filled 2023-12-08: qty 1

## 2023-12-08 MED ORDER — ASPIRIN 81 MG PO TBEC
81.0000 mg | DELAYED_RELEASE_TABLET | Freq: Every day | ORAL | Status: DC
  Administered 2023-12-09: 81 mg via ORAL
  Filled 2023-12-08: qty 1

## 2023-12-08 MED ORDER — PHENYLEPHRINE 80 MCG/ML (10ML) SYRINGE FOR IV PUSH (FOR BLOOD PRESSURE SUPPORT)
PREFILLED_SYRINGE | INTRAVENOUS | Status: DC | PRN
  Administered 2023-12-08: 160 ug via INTRAVENOUS
  Administered 2023-12-08: 80 ug via INTRAVENOUS
  Administered 2023-12-08: 120 ug via INTRAVENOUS

## 2023-12-08 MED ORDER — CEFAZOLIN SODIUM-DEXTROSE 2-4 GM/100ML-% IV SOLN
2.0000 g | INTRAVENOUS | Status: AC
  Administered 2023-12-08: 2 g via INTRAVENOUS
  Filled 2023-12-08: qty 100

## 2023-12-08 MED ORDER — OXYCODONE HCL 5 MG PO TABS: 5.0000 mg | ORAL_TABLET | Freq: Four times a day (QID) | ORAL | 0 refills | Status: DC | PRN

## 2023-12-08 MED ORDER — FENTANYL CITRATE (PF) 250 MCG/5ML IJ SOLN
INTRAMUSCULAR | Status: DC | PRN
  Administered 2023-12-08 (×2): 50 ug via INTRAVENOUS

## 2023-12-08 MED ORDER — ANASTROZOLE 1 MG PO TABS
1.0000 mg | ORAL_TABLET | Freq: Every day | ORAL | Status: DC
  Administered 2023-12-08 – 2023-12-09 (×2): 1 mg via ORAL
  Filled 2023-12-08 (×2): qty 1

## 2023-12-08 MED ORDER — HYDROMORPHONE HCL 1 MG/ML IJ SOLN
INTRAMUSCULAR | Status: AC
  Filled 2023-12-08: qty 1

## 2023-12-08 MED ORDER — ONDANSETRON HCL 4 MG/2ML IJ SOLN
INTRAMUSCULAR | Status: DC | PRN
  Administered 2023-12-08: 4 mg via INTRAVENOUS

## 2023-12-08 MED ORDER — BUPIVACAINE LIPOSOME 1.3 % IJ SUSP
INTRAMUSCULAR | Status: DC | PRN
  Administered 2023-12-08: 10 mL via PERINEURAL

## 2023-12-08 MED ORDER — PROPOFOL 10 MG/ML IV BOLUS
INTRAVENOUS | Status: DC | PRN
  Administered 2023-12-08: 160 ug/kg/min via INTRAVENOUS
  Administered 2023-12-08: 90 ug/kg/min via INTRAVENOUS

## 2023-12-08 MED ORDER — ONDANSETRON HCL 4 MG/2ML IJ SOLN: 4.0000 mg | Freq: Once | INTRAMUSCULAR | Status: DC | PRN

## 2023-12-08 MED ORDER — HYDROMORPHONE HCL 1 MG/ML IJ SOLN
0.2500 mg | INTRAMUSCULAR | Status: DC | PRN
  Administered 2023-12-08: 0.25 mg via INTRAVENOUS

## 2023-12-08 MED ORDER — FENTANYL CITRATE (PF) 100 MCG/2ML IJ SOLN
INTRAMUSCULAR | Status: AC
  Filled 2023-12-08: qty 2

## 2023-12-08 MED ORDER — OXYCODONE HCL 5 MG PO TABS
5.0000 mg | ORAL_TABLET | ORAL | Status: DC | PRN
Start: 2023-12-08 — End: 2023-12-09
  Administered 2023-12-08: 10 mg via ORAL
  Filled 2023-12-08: qty 2

## 2023-12-08 MED ORDER — METOPROLOL SUCCINATE ER 25 MG PO TB24
25.0000 mg | ORAL_TABLET | Freq: Every day | ORAL | Status: DC
  Administered 2023-12-08 – 2023-12-09 (×2): 25 mg via ORAL
  Filled 2023-12-08 (×2): qty 1

## 2023-12-08 MED ORDER — METHOCARBAMOL 500 MG PO TABS
500.0000 mg | ORAL_TABLET | Freq: Four times a day (QID) | ORAL | Status: DC | PRN
  Administered 2023-12-08: 500 mg via ORAL
  Filled 2023-12-08: qty 1

## 2023-12-08 MED ORDER — ORAL CARE MOUTH RINSE: 15.0000 mL | Freq: Once | OROMUCOSAL | Status: AC

## 2023-12-08 MED ORDER — FENTANYL CITRATE PF 50 MCG/ML IJ SOSY
50.0000 ug | PREFILLED_SYRINGE | INTRAMUSCULAR | Status: DC
  Administered 2023-12-08: 100 ug via INTRAVENOUS
  Filled 2023-12-08: qty 2

## 2023-12-08 MED ORDER — SODIUM CHLORIDE 0.9% FLUSH
3.0000 mL | Freq: Two times a day (BID) | INTRAVENOUS | Status: DC
  Administered 2023-12-08 – 2023-12-09 (×3): 5 mL via INTRAVENOUS

## 2023-12-08 MED ORDER — LIDOCAINE 2% (20 MG/ML) 5 ML SYRINGE
INTRAMUSCULAR | Status: DC | PRN
  Administered 2023-12-08: 60 mg via INTRAVENOUS

## 2023-12-08 MED ORDER — MORPHINE SULFATE (PF) 2 MG/ML IV SOLN
1.0000 mg | INTRAVENOUS | Status: DC | PRN
  Administered 2023-12-08: 1 mg via INTRAVENOUS
  Filled 2023-12-08: qty 1

## 2023-12-08 MED ORDER — SODIUM CHLORIDE 0.9 % IV SOLN: INTRAVENOUS | Status: DC

## 2023-12-08 MED ORDER — 0.9 % SODIUM CHLORIDE (POUR BTL) OPTIME
TOPICAL | Status: DC | PRN
  Administered 2023-12-08: 1000 mL

## 2023-12-08 MED ORDER — CHLORHEXIDINE GLUCONATE 0.12 % MT SOLN
15.0000 mL | Freq: Once | OROMUCOSAL | Status: AC
  Administered 2023-12-08: 15 mL via OROMUCOSAL

## 2023-12-08 MED ORDER — LACOSAMIDE 50 MG PO TABS
150.0000 mg | ORAL_TABLET | ORAL | Status: DC
  Administered 2023-12-09: 150 mg via ORAL
  Filled 2023-12-08: qty 3

## 2023-12-08 MED ORDER — PANTOPRAZOLE SODIUM 40 MG IV SOLR
40.0000 mg | Freq: Every day | INTRAVENOUS | Status: DC
  Administered 2023-12-08: 40 mg via INTRAVENOUS
  Filled 2023-12-08: qty 10

## 2023-12-08 SURGICAL SUPPLY — 37 items
APPLIER CLIP 11 MED OPEN (CLIP) ×1 IMPLANT
APPLIER CLIP 9.375 MED OPEN (MISCELLANEOUS) ×2 IMPLANT
BAG COUNTER SPONGE SURGICOUNT (BAG) ×1 IMPLANT
BINDER BREAST LRG (GAUZE/BANDAGES/DRESSINGS) IMPLANT
BINDER BREAST XLRG (GAUZE/BANDAGES/DRESSINGS) IMPLANT
BIOPATCH RED 1 DISK 7.0 (GAUZE/BANDAGES/DRESSINGS) ×2 IMPLANT
CANISTER SUCT 3000ML PPV (MISCELLANEOUS) ×1 IMPLANT
CHLORAPREP W/TINT 26 (MISCELLANEOUS) ×1 IMPLANT
CLIP APPLIE 11 MED OPEN (CLIP) IMPLANT
CLIP APPLIE 9.375 MED OPEN (MISCELLANEOUS) ×1 IMPLANT
COVER SURGICAL LIGHT HANDLE (MISCELLANEOUS) ×1 IMPLANT
DERMABOND ADVANCED .7 DNX12 (GAUZE/BANDAGES/DRESSINGS) ×1 IMPLANT
DEVICE DSSCT PLSMBLD 3.0S LGHT (MISCELLANEOUS) ×1 IMPLANT
DRAIN CHANNEL 19F RND (DRAIN) ×1 IMPLANT
DRAPE LAPAROSCOPIC ABDOMINAL (DRAPES) ×1 IMPLANT
ELECT REM PT RETURN 9FT ADLT (ELECTROSURGICAL) ×1 IMPLANT
ELECTRODE REM PT RTRN 9FT ADLT (ELECTROSURGICAL) ×1 IMPLANT
EVACUATOR SILICONE 100CC (DRAIN) ×1 IMPLANT
GAUZE PAD ABD 8X10 STRL (GAUZE/BANDAGES/DRESSINGS) ×2 IMPLANT
GAUZE SPONGE 4X4 12PLY STRL (GAUZE/BANDAGES/DRESSINGS) ×1 IMPLANT
GLOVE BIO SURGEON STRL SZ7.5 (GLOVE) ×1 IMPLANT
GOWN STRL REUS W/ TWL LRG LVL3 (GOWN DISPOSABLE) ×2 IMPLANT
KIT BASIN OR (CUSTOM PROCEDURE TRAY) ×1 IMPLANT
KIT TURNOVER KIT B (KITS) ×1 IMPLANT
LIGHT WAVEGUIDE WIDE FLAT (MISCELLANEOUS) IMPLANT
NS IRRIG 1000ML POUR BTL (IV SOLUTION) ×1 IMPLANT
PACK GENERAL/GYN (CUSTOM PROCEDURE TRAY) ×1 IMPLANT
PAD ARMBOARD 7.5X6 YLW CONV (MISCELLANEOUS) ×1 IMPLANT
PLASMABLADE 3.0S W/LIGHT (MISCELLANEOUS) ×1 IMPLANT
SPECIMEN JAR X LARGE (MISCELLANEOUS) ×1 IMPLANT
SUT ETHILON 3 0 FSL (SUTURE) ×1 IMPLANT
SUT MON AB 4-0 PC3 18 (SUTURE) ×1 IMPLANT
SUT VIC AB 3-0 54X BRD REEL (SUTURE) IMPLANT
SUT VIC AB 3-0 SH 18 (SUTURE) ×1 IMPLANT
TOWEL GREEN STERILE (TOWEL DISPOSABLE) ×1 IMPLANT
TOWEL GREEN STERILE FF (TOWEL DISPOSABLE) ×1 IMPLANT
TUBE CONNECTING 12X1/4 (SUCTIONS) ×1 IMPLANT

## 2023-12-08 NOTE — Op Note (Signed)
 12/08/2023  10:24 AM  PATIENT:  Kirsten Mcmahon  78 y.o. female  PRE-OPERATIVE DIAGNOSIS:  RIGHT BREAST CANCER  POST-OPERATIVE DIAGNOSIS:  RIGHT BREAST CANCER  PROCEDURE:  Procedure(s): RIGHT MODIFIED RADICAL MASTECTOMY (Right)  SURGEON:  Surgeons and Role:    * Curvin Deward MOULD, MD - Primary  PHYSICIAN ASSISTANT:   ASSISTANTS: none   ANESTHESIA:   general  EBL:  minimal   BLOOD ADMINISTERED:none  DRAINS: (1) Blake drain(s) in the prepectoral space    LOCAL MEDICATIONS USED:  NONE  SPECIMEN:  Source of Specimen:  right mastectomy and axillary contents  DISPOSITION OF SPECIMEN:  PATHOLOGY  COUNTS:  YES  TOURNIQUET:  * No tourniquets in log *  DICTATION: .Dragon Dictation  After informed consent was obtained the patient was brought to the operating room and placed in the supine position on the operating table.  After adequate induction of general anesthesia the patient's right chest, breast, and axillary area were prepped with ChloraPrep, allowed to dry, and draped in usual sterile manner.  An appropriate timeout was performed.  An elliptical incision was made around the nipple and areola complex in order to minimize any excess skin.  The incision was made with a 10 blade knife.  The incision was carried through the skin and subcutaneous tissue sharply with the PlasmaBlade.  Breast hooks were then used to elevate the skin flaps anteriorly towards the ceiling.  Thin skin flaps were then created by dissecting between the breast tissue and the subcutaneous fat and skin.  This dissection was carried circumferentially all the way to the chest wall.  Next the breast was removed from the pectoralis muscle with the pectoralis fascia.  Laterally the dissection was carried all the way to the latissimus muscle.  Once the breast was mobilized I was then able to dissected along the serratus muscle medially until the medial and lateral dissections met each other.  Several small perforating  vessels and intercostobrachial nerves were controlled with clips.  Once into the deep axillary space then I was able to identify superiorly to the axillary vein.  The contents of the axilla between the axillary vein, serratus muscle medially, and the latissimus muscle laterally were dissected out by blunt right angle dissection.  Several small vessels and lymphatics were controlled with clips.  The thoracodorsal and long thoracic neurovascular complexes were identified and spared.  Once this dissection was complete then the entire contents of the right axilla were removed en bloc with the breast.  The breast was marked with a stitch on the lateral skin and the specimen was sent to pathology for further evaluation.  Hemostasis was achieved using the PlasmaBlade.  The wound was irrigated with copious amounts of saline.  A small stab incision was made near the anterior axillary line inferior to the operative bed.  A tonsil clamp was placed through this incision and used to bring a 19 French round Blake drain into the operative bed.  The drain was curled along the chest wall.  The drain was anchored to the skin with a 3-0 nylon stitch.  Next the superior and inferior skin flaps were healthy appearing.  The skin flaps were grossly reapproximated with interrupted 3-0 Vicryl stitches.  The skin was then closed with a running 4-0 Monocryl subcuticular stitch.  Dermabond dressings and sterile drain dressings were applied.  The patient tolerated the procedure well.  At the end of the case all needle sponge and instrument counts were correct.  The patient was  then awakened and taken to recovery in stable condition.  PLAN OF CARE: Admit for overnight observation  PATIENT DISPOSITION:  PACU - hemodynamically stable.   Delay start of Pharmacological VTE agent (>24hrs) due to surgical blood loss or risk of bleeding: no

## 2023-12-08 NOTE — Anesthesia Procedure Notes (Signed)
 Procedure Name: LMA Insertion Date/Time: 12/08/2023 8:52 AM  Performed by: Delores Duwaine SAUNDERS, CRNAPre-anesthesia Checklist: Patient identified, Emergency Drugs available, Suction available and Patient being monitored Patient Re-evaluated:Patient Re-evaluated prior to induction Oxygen Delivery Method: Circle System Utilized Preoxygenation: Pre-oxygenation with 100% oxygen Induction Type: IV induction Ventilation: Mask ventilation without difficulty LMA: LMA inserted LMA Size: 4.0 Number of attempts: 1 Airway Equipment and Method: Bite block Placement Confirmation: positive ETCO2 Tube secured with: Tape Dental Injury: Teeth and Oropharynx as per pre-operative assessment

## 2023-12-08 NOTE — Interval H&P Note (Signed)
 History and Physical Interval Note:  12/08/2023 7:55 AM  Kirsten Mcmahon  has presented today for surgery, with the diagnosis of RIGHT BREAST CANCER.  The various methods of treatment have been discussed with the patient and family. After consideration of risks, benefits and other options for treatment, the patient has consented to  Procedure(s): RIGHT MODIFIED RADICAL MASTECTOMY (Right) as a surgical intervention.  The patient's history has been reviewed, patient examined, no change in status, stable for surgery.  I have reviewed the patient's chart and labs.  Questions were answered to the patient's satisfaction.     [name]

## 2023-12-08 NOTE — Transfer of Care (Signed)
 Immediate Anesthesia Transfer of Care Note  Patient: Kirsten Mcmahon  Procedure(s) Performed: RIGHT MODIFIED RADICAL MASTECTOMY (Right)  Patient Location: PACU  Anesthesia Type:General  Level of Consciousness: awake  Airway & Oxygen Therapy: Patient Spontanous Breathing and Patient connected to face mask  Post-op Assessment: Report given to RN  Post vital signs: Reviewed and stable  Last Vitals:  Vitals Value Taken Time  BP 139/82 12/08/23 1033  Temp    Pulse 80 12/08/23 1036  Resp 13 12/08/23 1036  SpO2 100 % 12/08/23 1036  Vitals shown include unfiled device data.  Last Pain:  Vitals:   12/08/23 0815  TempSrc:   PainSc: 0-No pain         Complications: No notable events documented.

## 2023-12-08 NOTE — H&P (Signed)
 MRN: [id number] DOB: 07-20-1946 DATE OF ENCOUNTER: 09/12/2023 Subjective   Chief Complaint: Wound Check (Rt Breast)  History of Present Illness: Kirsten Mcmahon is a 78 y.o. female who is seen today as an office consultation for evaluation of Wound Check (Rt Breast)  We are asked to see the patient in consultation by Dr. Odean to evaluate her for a new right breast cancer. The patient is a 78 year old black female who was evaluated earlier this year for a mass of the right breast. She was found to have a 2 cm mass in the upper outer central right breast that seems to be involving the nipple. This was biopsied and came back as an invasive ductal cancer that was ER and PR positive and HER2 negative with a Ki-67 of 5%. She has a history of a right breast cancer treated in 2016. She was also found to have a 5 cm mass in the central left breast that was biopsied and came back benign what was thought to be discordant. She has had a history of strokes and seizures. She is unable to move her left side very well. She reports some chest pain at rest. Since her last visit the mass has grown to 4 cm and is beginning to ulcerate through the nipple. This has all happened on antiestrogens. After her last visit she did not see cardiology or neurology for clearance  Review of Systems: A complete review of systems was obtained from the patient. I have reviewed this information and discussed as appropriate with the patient. See HPI as well for other ROS.  ROS   Medical History: History reviewed. No pertinent past medical history.  Patient Active Problem List  Diagnosis  Malignant neoplasm of upper-outer quadrant of right breast in female, estrogen receptor positive (CMS/HHS-HCC)  Subareolar mass of left breast   History reviewed. No pertinent surgical history.   Allergies  Allergen Reactions  Iodinated Contrast Media Swelling  Other reaction(s): NO ALLERGY  Latex Itching  Other reaction(s): NO ALLERGY   Shellfish Derived Unknown  Other reaction(s): NO ALLERGY  Insulin  Detemir Itching and Other (See Comments)  Unsure of reaction 02/03/2015 Pt states she had no reaction to the insulin  but her sugar was very low and her Dr. Glenwood did not need insulin  and put her on Metformin    Current Outpatient Medications on File Prior to Visit  Medication Sig Dispense Refill  amino ac-protein hydr-whey pro (PROSOURCE PLUS) 15-100 gram-kcal/30 mL Liqd Take by mouth  amino ac-protein hydr-whey pro (PROSOURCE) 10-100 gram-kcal/30 mL Liqd Take by mouth  aspirin  81 MG EC tablet Take by mouth  benzonatate (TESSALON) 100 MG capsule TAKE 1 CAPSULE AT BEDTIME FOR COUGH 10 DAYS  donepeziL  (ARICEPT ) 10 MG tablet Take by mouth  methoxy peg-epoetin  beta (MIRCERA INJ) Mircera  SORE THROAT, BENZOCAINE-MENTH, 15-3.6 mg lozenge USE 1 LOZENGE EVERY 2 HOURS AS NEEDED FOR SORE THROAT FOR 10 DAYS   No current facility-administered medications on file prior to visit.   History reviewed. No pertinent family history.   Social History   Tobacco Use  Smoking Status Never  Smokeless Tobacco Never    Social History   Socioeconomic History  Marital status: Single  Tobacco Use  Smoking status: Never  Smokeless tobacco: Never  Vaping Use  Vaping status: Never Used  Substance and Sexual Activity  Alcohol  use: Not Currently  Drug use: Never   Social Determinants of Health   Food Insecurity: No Food Insecurity (05/31/2023)  Received from [facility]  Hunger Vital Sign  Worried About Running Out of Food in the Last Year: Never true  Ran Out of Food in the Last Year: Never true  Transportation Needs: No Transportation Needs (05/31/2023)  Received from [facility] - Transportation  Lack of Transportation (Medical): No  Lack of Transportation (Non-Medical): No   Objective:   Vitals:  09/12/23 1327  PainSc: 0-No pain   There is no height or weight on file to calculate BMI.  Physical Exam Vitals  reviewed.  Constitutional:  General: She is not in acute distress. Appearance: Normal appearance.  HENT:  Head: Normocephalic and atraumatic.  Right Ear: External ear normal.  Left Ear: External ear normal.  Nose: Nose normal.  Mouth/Throat:  Mouth: Mucous membranes are moist.  Pharynx: Oropharynx is clear.  Eyes:  General: No scleral icterus. Extraocular Movements: Extraocular movements intact.  Conjunctiva/sclera: Conjunctivae normal.  Pupils: Pupils are equal, round, and reactive to light.  Cardiovascular:  Rate and Rhythm: Normal rate and regular rhythm.  Pulses: Normal pulses.  Heart sounds: Normal heart sounds.  Pulmonary:  Effort: Pulmonary effort is normal. No respiratory distress.  Breath sounds: Normal breath sounds.  Abdominal:  General: Bowel sounds are normal.  Palpations: Abdomen is soft.  Tenderness: There is no abdominal tenderness.  Musculoskeletal:  General: Swelling present. No tenderness or deformity.  Cervical back: Normal range of motion and neck supple.  Comments: The patient is unable to move her left upper or lower extremity very well  Skin: General: Skin is warm and dry.  Coloration: Skin is not jaundiced.  Neurological:  General: No focal deficit present.  Mental Status: She is alert and oriented to person, place, and time.  Psychiatric:  Mood and Affect: Mood normal.  Behavior: Behavior normal.     Breast: There is a 4 cm mass in the central lateral right breast that seems to be involving the nipple with some ulceration. There is also some fullness in the central left breast. She is unable to raise the arms very well for a node evaluation. There appears to be some palpable lymphadenopathy in the right axilla  Labs, Imaging and Diagnostic Testing:  Assessment and Plan:   Diagnoses and all orders for this visit:  Malignant neoplasm of upper-outer quadrant of right breast in female, estrogen receptor positive (CMS/HHS-HCC) - Ambulatory  Referral to Cardiology - Ambulatory Referral to Neurology - [facility] Case Posting Request; Future   The patient appears to have a 4 cm central upper outer quadrant cancer in the right breast that may be involving the nipple that has favorable markers. She has had some progression of the tumor while on antiestrogens over the last several months. She would need clearance from cardiology and neurology prior to thinking about scheduling surgery. We will make those referrals again. She would likely benefit from a right modified radical mastectomy. I have discussed with them in detail the risk and benefits of the operation as well as some of the technical aspects and they understand and wish to proceed. We will wait for clearance and then begin surgical scheduling

## 2023-12-08 NOTE — Anesthesia Procedure Notes (Signed)
 Anesthesia Regional Block: Pectoralis block   Pre-Anesthetic Checklist: , timeout performed,  Correct Patient, Correct Site, Correct Laterality,  Correct Procedure, Correct Position, site marked,  Risks and benefits discussed,  Surgical consent,  Pre-op evaluation,  At surgeon's request and post-op pain management  Laterality: Right  Prep: chloraprep       Needles:  Injection technique: Single-shot  Needle Type: Echogenic Stimulator Needle     Needle Length: 10cm  Needle Gauge: 21   Needle insertion depth: 7 cm   Additional Needles:   Procedures:,,,, ultrasound used (permanent image in chart),,    Narrative:  Start time: 12/08/2023 8:14 AM End time: 12/08/2023 8:19 AM Injection made incrementally with aspirations every 5 mL.  Performed by: Personally  Anesthesiologist: Jerrye Sharper, MD  Additional Notes: Timeout performed. Patient sedated. Relevant anatomy ID'd using US . Incremental 2-5ml injection of LA with frequent aspiration. Patient tolerated procedure well.

## 2023-12-08 NOTE — Anesthesia Postprocedure Evaluation (Signed)
 Anesthesia Post Note  Patient: Kirsten Mcmahon  Procedure(s) Performed: RIGHT MODIFIED RADICAL MASTECTOMY (Right)     Patient location during evaluation: PACU Anesthesia Type: General Level of consciousness: awake and alert and oriented Pain management: pain level controlled Vital Signs Assessment: post-procedure vital signs reviewed and stable Respiratory status: spontaneous breathing, nonlabored ventilation and respiratory function stable Cardiovascular status: blood pressure returned to baseline and stable Postop Assessment: no apparent nausea or vomiting Anesthetic complications: no   No notable events documented.  Last Vitals:  Vitals:   12/08/23 1200 12/08/23 1215  BP: 132/81 (!) 140/75  Pulse: 73 69  Resp: 19 12  Temp: (!) 36.4 C   SpO2: 100% 100%    Last Pain:  Vitals:   12/08/23 1146  TempSrc:   PainSc: 0-No pain                 Petrita Blunck A.

## 2023-12-08 NOTE — Interval H&P Note (Signed)
 History and Physical Interval Note:  12/08/2023 7:57 AM  Kirsten Mcmahon  has presented today for surgery, with the diagnosis of RIGHT BREAST CANCER.  The various methods of treatment have been discussed with the patient and family. After consideration of risks, benefits and other options for treatment, the patient has consented to  Procedure(s): RIGHT MODIFIED RADICAL MASTECTOMY (Right) as a surgical intervention.  The patient's history has been reviewed, patient examined, no change in status, stable for surgery.  I have reviewed the patient's chart and labs.  Questions were answered to the patient's satisfaction.     [name]

## 2023-12-09 ENCOUNTER — Encounter (HOSPITAL_COMMUNITY): Payer: Self-pay | Admitting: General Surgery

## 2023-12-09 DIAGNOSIS — C50411 Malignant neoplasm of upper-outer quadrant of right female breast: Secondary | ICD-10-CM | POA: Diagnosis not present

## 2023-12-09 NOTE — Progress Notes (Signed)
 1 Day Post-Op   Subjective/Chief Complaint: No complaints other than some coughing   Objective: Vital signs in last 24 hours: Temp:  [96.2 F (35.7 C)-98.5 F (36.9 C)] 98.5 F (36.9 C) (01/03 0509) Pulse Rate:  [69-92] 80 (01/03 0509) Resp:  [12-22] 15 (01/03 0509) BP: (118-165)/(68-88) 120/72 (01/03 0509) SpO2:  [85 %-100 %] 98 % (01/03 0509) Weight:  [54.4 kg] 54.4 kg (01/02 0802) Last BM Date : 12/08/23  Intake/Output from previous day: 01/02 0701 - 01/03 0700 In: 682.2 [P.O.:180; I.V.:402.2; IV Piggyback:100] Out: 190 [Drains:180; Blood:10] Intake/Output this shift: No intake/output data recorded.  General appearance: alert and cooperative Resp: clear to auscultation bilaterally Chest wall: skin flaps look healthy Cardio: regular rate and rhythm GI: soft, non-tender; bowel sounds normal; no masses,  no organomegaly  Lab Results:  Recent Labs    12/06/23 1106  WBC 10.0  HGB 10.1*  HCT 32.4*  PLT 269   BMET Recent Labs    12/08/23 0757  NA 138  K 3.9  CL 95*  CO2 25  GLUCOSE 121*  BUN 54*  CREATININE 12.03*  CALCIUM  9.0   PT/INR No results for input(s): LABPROT, INR in the last 72 hours. ABG No results for input(s): PHART, HCO3 in the last 72 hours.  Invalid input(s): PCO2, PO2  Studies/Results: No results found.  Anti-infectives: Anti-infectives (From admission, onward)    Start     Dose/Rate Route Frequency Ordered Stop   12/08/23 0745  ceFAZolin  (ANCEF ) IVPB 2g/100 mL premix        2 g 200 mL/hr over 30 Minutes Intravenous On call to O.R. 12/08/23 0738 12/08/23 0849       Assessment/Plan: s/p Procedure(s): RIGHT MODIFIED RADICAL MASTECTOMY (Right) Advance diet Discharge Teach pt and family drain care  LOS: 0 days    [name] 12/09/2023

## 2023-12-09 NOTE — Progress Notes (Signed)
   12/09/23 1000  Spiritual Encounters  Type of Visit Initial  Care provided to: Patient  Reason for visit Routine spiritual support  OnCall Visit No   Visited patient while on rounds this morning. Provided generalized spiritual support.  Will remain available if needed.

## 2023-12-09 NOTE — Plan of Care (Signed)
  Problem: Nutrition: Goal: Adequate nutrition will be maintained Outcome: Progressing   Problem: Pain Management: Goal: General experience of comfort will improve Outcome: Progressing

## 2023-12-09 NOTE — Progress Notes (Addendum)
 Patient taken home by nephew, JP drain care provided by RN to patient's nephew prior to discharge.

## 2023-12-09 NOTE — Progress Notes (Signed)
   12/09/23 1114  TOC Brief Assessment  Insurance and Status Reviewed  Patient has primary care physician Yes  Home environment has been reviewed home with son  Prior level of function: modified independent  Prior/Current Home Services No current home services  Social Drivers of Health Review SDOH reviewed no interventions necessary  Readmission risk has been reviewed Yes  Transition of care needs no transition of care needs at this time

## 2023-12-09 NOTE — Progress Notes (Signed)
 Patient discharged home, IV removed, awaiting to provide discharge paperwork along with drain education to son who is patient's primary caretaker.

## 2023-12-10 ENCOUNTER — Emergency Department (HOSPITAL_COMMUNITY)
Admission: EM | Admit: 2023-12-10 | Discharge: 2023-12-10 | Disposition: A | Discharge status: Home / Self Care | Payer: 59 | Attending: Emergency Medicine | Admitting: Emergency Medicine

## 2023-12-10 ENCOUNTER — Encounter (HOSPITAL_COMMUNITY): Payer: Self-pay

## 2023-12-10 ENCOUNTER — Other Ambulatory Visit: Payer: Self-pay

## 2023-12-10 ENCOUNTER — Emergency Department (HOSPITAL_COMMUNITY): Payer: 59

## 2023-12-10 DIAGNOSIS — R569 Unspecified convulsions: Secondary | ICD-10-CM | POA: Diagnosis present

## 2023-12-10 DIAGNOSIS — Z8673 Personal history of transient ischemic attack (TIA), and cerebral infarction without residual deficits: Secondary | ICD-10-CM | POA: Diagnosis not present

## 2023-12-10 DIAGNOSIS — R519 Headache, unspecified: Secondary | ICD-10-CM | POA: Diagnosis not present

## 2023-12-10 LAB — CBC WITH DIFFERENTIAL/PLATELET
Abs Immature Granulocytes: 0.08 10*3/uL — ABNORMAL HIGH (ref 0.00–0.07)
Basophils Absolute: 0 10*3/uL (ref 0.0–0.1)
Basophils Relative: 0 %
Eosinophils Absolute: 0.1 10*3/uL (ref 0.0–0.5)
Eosinophils Relative: 1 %
HCT: 28.6 % — ABNORMAL LOW (ref 36.0–46.0)
Hemoglobin: 9.5 g/dL — ABNORMAL LOW (ref 12.0–15.0)
Immature Granulocytes: 1 %
Lymphocytes Relative: 6 %
Lymphs Abs: 0.7 10*3/uL (ref 0.7–4.0)
MCH: 33 pg (ref 26.0–34.0)
MCHC: 33.2 g/dL (ref 30.0–36.0)
MCV: 99.3 fL (ref 80.0–100.0)
Monocytes Absolute: 0.9 10*3/uL (ref 0.1–1.0)
Monocytes Relative: 7 %
Neutro Abs: 11.3 10*3/uL — ABNORMAL HIGH (ref 1.7–7.7)
Neutrophils Relative %: 85 %
Platelets: 291 10*3/uL (ref 150–400)
RBC: 2.88 MIL/uL — ABNORMAL LOW (ref 3.87–5.11)
RDW: 13.2 % (ref 11.5–15.5)
WBC: 13.1 10*3/uL — ABNORMAL HIGH (ref 4.0–10.5)
nRBC: 0 % (ref 0.0–0.2)

## 2023-12-10 LAB — BASIC METABOLIC PANEL
Anion gap: 16 — ABNORMAL HIGH (ref 5–15)
BUN: 23 mg/dL (ref 8–23)
CO2: 27 mmol/L (ref 22–32)
Calcium: 8.6 mg/dL — ABNORMAL LOW (ref 8.9–10.3)
Chloride: 92 mmol/L — ABNORMAL LOW (ref 98–111)
Creatinine, Ser: 7.29 mg/dL — ABNORMAL HIGH (ref 0.44–1.00)
GFR, Estimated: 5 mL/min — ABNORMAL LOW (ref >60)
Glucose, Bld: 126 mg/dL — ABNORMAL HIGH (ref 70–99)
Potassium: 3.2 mmol/L — ABNORMAL LOW (ref 3.5–5.1)
Sodium: 135 mmol/L (ref 135–145)

## 2023-12-10 MED ORDER — ACETAMINOPHEN 500 MG PO TABS
1000.0000 mg | ORAL_TABLET | Freq: Once | ORAL | Status: AC
  Administered 2023-12-10: 1000 mg via ORAL
  Filled 2023-12-10: qty 2

## 2023-12-10 NOTE — ED Notes (Signed)
 Son [name] [phone] would like an update asap

## 2023-12-10 NOTE — ED Triage Notes (Signed)
 Pt bib ems from dialysis c/o seizure that lasted 1 minute which is for the normal per son. Pt didn't fall or hit her head. Pt isn't ambulatory.  Hx CVA left side deficits  Pt is postictal with headache currently.   HR 85 BP 130/80 RA 94% CBG 144

## 2023-12-10 NOTE — ED Provider Notes (Signed)
 [place] EMERGENCY DEPARTMENT AT [facility] Provider Note   CSN: [id number] Arrival date & time: 12/10/23  1451     History Chief Complaint  Patient presents with   Seizures    HPI Kirsten Mcmahon is a 78 y.o. female presenting for seizure episode.  Well-known to the emergency department how frequent evaluations for similar in the past.  Currently she is having a headache but denies any other complaints.  Allegedly patient was very confused.  Family member arrived after patient had been in the emergency department for 4 hours and states this is how she always is that she wants her discharge soon as possible and so they can take her home. Has extensive care team at home. Patient denies any acute concerns denies fevers chills nausea vomiting shortness of breath..   Patient's recorded medical, surgical, social, medication list and allergies were reviewed in the Snapshot window as part of the initial history.   Review of Systems   Review of Systems  Constitutional:  Negative for chills and fever.  HENT:  Negative for ear pain and sore throat.   Eyes:  Negative for pain and visual disturbance.  Respiratory:  Negative for cough and shortness of breath.   Cardiovascular:  Negative for chest pain and palpitations.  Gastrointestinal:  Negative for abdominal pain and vomiting.  Genitourinary:  Negative for dysuria and hematuria.  Musculoskeletal:  Negative for arthralgias and back pain.  Skin:  Negative for color change and rash.  Neurological:  Negative for seizures and syncope.  Psychiatric/Behavioral:  Positive for confusion.   All other systems reviewed and are negative.   Physical Exam Updated Vital Signs BP (!) 105/59 (BP Location: Left Arm)   Pulse 98   Temp 98.2 F (36.8 C) (Oral)   Resp 18   Ht 5' 4 (1.626 m)   Wt 54.4 kg   SpO2 100%   BMI 20.60 kg/m  Physical Exam Vitals and nursing note reviewed.  Constitutional:      General: She is not in acute  distress.    Appearance: She is well-developed.  HENT:     Head: Normocephalic and atraumatic.  Eyes:     Conjunctiva/sclera: Conjunctivae normal.  Cardiovascular:     Rate and Rhythm: Normal rate and regular rhythm.     Heart sounds: No murmur heard. Pulmonary:     Effort: Pulmonary effort is normal. No respiratory distress.     Breath sounds: Normal breath sounds.  Abdominal:     General: There is no distension.     Palpations: Abdomen is soft.     Tenderness: There is no abdominal tenderness. There is no right CVA tenderness or left CVA tenderness.  Musculoskeletal:        General: No swelling or tenderness. Normal range of motion.     Cervical back: Neck supple.  Skin:    General: Skin is warm and dry.  Neurological:     General: No focal deficit present.     Mental Status: She is alert and oriented to person, place, and time. Mental status is at baseline.     Cranial Nerves: No cranial nerve deficit.      ED Course/ Medical Decision Making/ A&P    Procedures Procedures   Medications Ordered in ED Medications  acetaminophen  (TYLENOL ) tablet 1,000 mg (1,000 mg Oral Given 12/10/23 1617)    Medical Decision Making:   78 year old female presenting with seizure disorder.  Per EMS, had a seizure at dialysis.  Family arrived however and stated that she has frequent shaking episodes during dialysis frequently confused with seizures. They stated that she is at her mental status baseline at this time.  History of present illness physicals and findings are concerning for seizure versus nonepileptic seizures. Lab work for metabolic, hematologic or other infectious pathology was performed and no acute pathology was diagnosed. Reassessment: After 4-1/2 hours of observation in the emergency room, patient is returned to her mental status baseline.  CT head was performed to evaluate for intracranial structural pathology including intracranial hemorrhage or mass and was grossly  nondiagnostic for acute pathology.  Given return to baseline per family and family request for outpatient care expediently, patient was discharged with plan for close follow-up with PCP within 48 hours.  Disposition:  I have considered need for hospitalization, however, considering all of the above, I believe this patient is stable for discharge at this time.  Patient/family educated about specific return precautions for given chief complaint and symptoms.  Patient/family educated about follow-up with PCP.     Patient/family expressed understanding of return precautions and need for follow-up. Patient spoken to regarding all imaging and laboratory results and appropriate follow up for these results. All education provided in verbal form with additional information in written form. Time was allowed for answering of patient questions. Patient discharged.    Emergency Department Medication Summary:   Medications  acetaminophen  (TYLENOL ) tablet 1,000 mg (1,000 mg Oral Given 12/10/23 1617)        Clinical Impression:  1. Seizure-like activity [facility])      Discharge   Final Clinical Impression(s) / ED Diagnoses Final diagnoses:  Seizure-like activity [facility])    Rx / DC Orders ED Discharge Orders     None         Jerral Meth, MD 12/10/23 1925

## 2023-12-10 NOTE — ED Notes (Signed)
 Pt family refused PTAR. Pts family verbalized that they are able to transfer the pt to the car and into the house safely.

## 2023-12-10 NOTE — ED Notes (Signed)
 RN spoke with pt son, [name]. He states the pt is on seizure medications but she has never had a seizure at home. He notes she only have seizure at dialysis. He is unsure what is going on and not sure if the tx are to effective.

## 2023-12-11 ENCOUNTER — Other Ambulatory Visit: Payer: Self-pay

## 2023-12-11 ENCOUNTER — Encounter (HOSPITAL_COMMUNITY): Payer: Self-pay

## 2023-12-11 ENCOUNTER — Emergency Department (HOSPITAL_COMMUNITY)
Admission: EM | Admit: 2023-12-11 | Discharge: 2023-12-11 | Disposition: A | Discharge status: Home / Self Care | Payer: 59 | Attending: Emergency Medicine | Admitting: Emergency Medicine

## 2023-12-11 DIAGNOSIS — N189 Chronic kidney disease, unspecified: Secondary | ICD-10-CM | POA: Diagnosis not present

## 2023-12-11 DIAGNOSIS — Z853 Personal history of malignant neoplasm of breast: Secondary | ICD-10-CM | POA: Insufficient documentation

## 2023-12-11 DIAGNOSIS — E039 Hypothyroidism, unspecified: Secondary | ICD-10-CM | POA: Diagnosis not present

## 2023-12-11 DIAGNOSIS — E119 Type 2 diabetes mellitus without complications: Secondary | ICD-10-CM | POA: Insufficient documentation

## 2023-12-11 DIAGNOSIS — T82594A Other mechanical complication of infusion catheter, initial encounter: Secondary | ICD-10-CM | POA: Insufficient documentation

## 2023-12-11 DIAGNOSIS — Y69 Unspecified misadventure during surgical and medical care: Secondary | ICD-10-CM | POA: Diagnosis not present

## 2023-12-11 DIAGNOSIS — I129 Hypertensive chronic kidney disease with stage 1 through stage 4 chronic kidney disease, or unspecified chronic kidney disease: Secondary | ICD-10-CM | POA: Diagnosis not present

## 2023-12-11 LAB — CBC WITH DIFFERENTIAL/PLATELET
Abs Immature Granulocytes: 0.06 10*3/uL (ref 0.00–0.07)
Basophils Absolute: 0.1 10*3/uL (ref 0.0–0.1)
Basophils Relative: 1 %
Eosinophils Absolute: 0.3 10*3/uL (ref 0.0–0.5)
Eosinophils Relative: 3 %
HCT: 28.9 % — ABNORMAL LOW (ref 36.0–46.0)
Hemoglobin: 9.2 g/dL — ABNORMAL LOW (ref 12.0–15.0)
Immature Granulocytes: 1 %
Lymphocytes Relative: 16 %
Lymphs Abs: 1.4 10*3/uL (ref 0.7–4.0)
MCH: 32.9 pg (ref 26.0–34.0)
MCHC: 31.8 g/dL (ref 30.0–36.0)
MCV: 103.2 fL — ABNORMAL HIGH (ref 80.0–100.0)
Monocytes Absolute: 0.8 10*3/uL (ref 0.1–1.0)
Monocytes Relative: 9 %
Neutro Abs: 6.2 10*3/uL (ref 1.7–7.7)
Neutrophils Relative %: 70 %
Platelets: 339 10*3/uL (ref 150–400)
RBC: 2.8 MIL/uL — ABNORMAL LOW (ref 3.87–5.11)
RDW: 13.2 % (ref 11.5–15.5)
WBC: 8.8 10*3/uL (ref 4.0–10.5)
nRBC: 0 % (ref 0.0–0.2)

## 2023-12-11 LAB — COMPREHENSIVE METABOLIC PANEL
ALT: <5 U/L (ref 0–44)
AST: 24 U/L (ref 15–41)
Albumin: 2.9 g/dL — ABNORMAL LOW (ref 3.5–5.0)
Alkaline Phosphatase: 56 U/L (ref 38–126)
Anion gap: 17 — ABNORMAL HIGH (ref 5–15)
BUN: 36 mg/dL — ABNORMAL HIGH (ref 8–23)
CO2: 26 mmol/L (ref 22–32)
Calcium: 8.5 mg/dL — ABNORMAL LOW (ref 8.9–10.3)
Chloride: 93 mmol/L — ABNORMAL LOW (ref 98–111)
Creatinine, Ser: 9.82 mg/dL — ABNORMAL HIGH (ref 0.44–1.00)
GFR, Estimated: 4 mL/min — ABNORMAL LOW (ref >60)
Glucose, Bld: 155 mg/dL — ABNORMAL HIGH (ref 70–99)
Potassium: 3.9 mmol/L (ref 3.5–5.1)
Sodium: 136 mmol/L (ref 135–145)
Total Bilirubin: 0.8 mg/dL (ref 0.0–1.2)
Total Protein: 7 g/dL (ref 6.5–8.1)

## 2023-12-11 NOTE — ED Triage Notes (Signed)
 Pt BIB [facility] d/t her drain from recent mastectomy slightly dislodged. It is leaking "but not a lot" per EMS.  4x4 was placed over by EMS.

## 2023-12-11 NOTE — Discharge Instructions (Signed)
 Thank you for coming to [facility] Emergency Department. You were seen for concern for possibly dislodged JP drain.  You were seen by surgery who flushed the drain and found a small blood clot that was likely obstructing it.  It is draining appropriately now.  You will be set up with a nursing visit later this week to have the drain checked and please follow-up with Dr. Curvin as originally scheduled within the next 1 to 2 weeks for your postoperative visit.  Your hemoglobin is mildly dropped after surgery which is not unexpected, please have this level rechecked.  Do not hesitate to return to the ED or call 911 if you experience: -Worsening symptoms -Increased pain, swelling, or pus drainage from the area -JP drain that stops draining -Lightheadedness, passing out -Fevers/chills -Anything else that concerns you

## 2023-12-11 NOTE — ED Provider Notes (Signed)
 [place] EMERGENCY DEPARTMENT AT [facility] Provider Note   CSN: [id number] Arrival date & time: 12/11/23  1927     History  Chief Complaint  Patient presents with   drain issue    Kirsten Mcmahon is a 78 y.o. female with PMH as listed below who presents BIB [facility] d/t her drain from recent mastectomy Not draining as well as it has. She states today the drainage into container w/ JP drain has decreased and she has started leaking fluid out from the wound for the drain. No increased pain, swelling, or purulent discharge. Denies f/c. Otherwise feels okay.   Per chart review had a right modified radical mastectomy on 12/08/2023 with Dr. Curvin.  She presented to ED yesterday after a seizure at dialysis, but family told the EDP she has frequent shaking episodes during dialysis and that she was at baseline and requested to be discharged.  CT head was without any acute pathology and labs were unremarkable.  Past Medical History:  Diagnosis Date   Anemia    Cancer (HCC) 2017   Right breast   Chronic kidney disease    progression to ESRD 04/10/21   GERD (gastroesophageal reflux disease)    Glaucoma    Hemiparesis (HCC)    left side   High cholesterol    History of seizure    after a spider bite; 07/15/21   History of stroke with residual deficit    left-side weakness   Hypertension    states BP under control with meds., has been on med. x 2 yr.   Hypothyroidism    Non-insulin  dependent type 2 diabetes mellitus (HCC)    Overactive bladder    PFO (patent foramen ovale) 05/15/2021   Stroke (HCC)    1998 weakness on left side; 05/12/21       Home Medications Prior to Admission medications   Medication Sig Start Date End Date Taking? Authorizing Provider  anastrozole  (ARIMIDEX ) 1 MG tablet Take 1 tablet (1 mg total) by mouth daily. Patient not taking: Reported on 12/10/2023 03/17/23   Odean Potts, MD  aspirin  EC 81 MG tablet Take 81 mg by mouth daily. Swallow whole. Patient  not taking: Reported on 12/10/2023    [provider]  calcitRIOL  (ROCALTROL ) 0.5 MCG capsule Take 1 capsule (0.5 mcg total) by mouth every Monday, Wednesday, and Friday with hemodialysis. 07/31/21   Patel, Pranav M, MD  diazePAM , 15 MG Dose, (VALTOCO  15 MG DOSE) 2 x 7.5 MG/0.1ML LQPK Place 15 mg into the nose once as needed for up to 1 dose (for seizure lasting more than 2 minutes.). Patient not taking: Reported on 12/10/2023 06/04/23   Tobie Yetta HERO, MD  docusate sodium  (COLACE) 100 MG capsule Take 1 capsule (100 mg total) by mouth 2 (two) times daily. Patient not taking: Reported on 12/10/2023 06/06/23   Tobie Yetta HERO, MD  donepezil  (ARICEPT ) 10 MG tablet Take 10 mg by mouth at bedtime. 06/28/22   [provider]  lacosamide  150 MG TABS Take 1 tablet (150 mg total) by mouth 2 (two) times daily. Patient taking differently: Take 150 mg by mouth See admin instructions. Take 150 mg by mouth on Sun/Tues/Thurs night(s)- before dialysis to prevent seizures (during dialysis) 06/04/23   Tobie Yetta HERO, MD  latanoprost  (XALATAN ) 0.005 % ophthalmic solution Place 1 drop into both eyes at bedtime. 09/07/18   Cook, Jayce G, DO  levothyroxine  (SYNTHROID , LEVOTHROID) 112 MCG tablet Take 1 tablet (112 mcg total) by  mouth daily before breakfast. 09/07/18   Cook, Jayce G, DO  Methoxy PEG-Epoetin  Beta (MIRCERA IJ) See admin instructions. 2 times a month at dialysis 08/10/23 08/08/24  [provider]  metoprolol  succinate (TOPROL -XL) 25 MG 24 hr tablet Take 1 tablet (25 mg total) by mouth daily. Patient not taking: Reported on 12/10/2023 06/04/23   Tobie Yetta HERO, MD  oxyCODONE  (ROXICODONE ) 5 MG immediate release tablet Take 1 tablet (5 mg total) by mouth every 6 (six) hours as needed for severe pain (pain score 7-10). Patient not taking: Reported on 12/10/2023 12/08/23   Curvin Mt III, MD  sucroferric oxyhydroxide (VELPHORO ) 500 MG chewable tablet Chew 500 mg by mouth 3 (three) times daily with meals.  10/13/21   [provider]      Allergies    Contrast media [iodinated contrast media], Latex, Shellfish-derived products, and Levemir [insulin  detemir]    Review of Systems   Review of Systems A 10 point review of systems was performed and is negative unless otherwise reported in HPI.  Physical Exam Updated Vital Signs BP 116/68 (BP Location: Left Arm)   Pulse 96   Temp 98 F (36.7 C) (Oral)   Resp 19   Ht 5' 4 (1.626 m)   Wt 54.4 kg   SpO2 99%   BMI 20.59 kg/m  Physical Exam General: Normal appearing elderly female, lying in bed.  HEENT: Sclera anicteric, MMM, trachea midline.  Cardiology: RRR, no murmurs/rubs/gallops.  Chest: Well-healing right mastectomy incision with no erythema, purulent drainage, dehiscence, swelling. Small amount serosanguinous fluid in drain. Gauze over JP drain site is saturated w/ serosanguinous fluid as is clothing/gown around it. No significant TTP. Resp: Normal respiratory rate and effort. CTAB, no wheezes, rhonchi, crackles.  Abd: Soft, non-tender, non-distended. No rebound tenderness or guarding.  GU: Deferred. MSK: No peripheral edema or signs of trauma.  Skin: warm, dry.  Neuro: A&Ox4, CNs II-XII grossly intact. MAEs. Sensation grossly intact.  Psych: Normal mood and affect.   ED Results / Procedures / Treatments   Labs (all labs ordered are listed, but only abnormal results are displayed) Labs Reviewed  CBC WITH DIFFERENTIAL/PLATELET - Abnormal; Notable for the following components:      Result Value   RBC 2.80 (*)    Hemoglobin 9.2 (*)    HCT 28.9 (*)    MCV 103.2 (*)    All other components within normal limits  COMPREHENSIVE METABOLIC PANEL    EKG None  Radiology CT HEAD WO CONTRAST ( ) Result Date: 12/10/2023 CLINICAL DATA:  Head trauma, minor (Age >= 65y).  Seizure. EXAM: CT HEAD WITHOUT CONTRAST TECHNIQUE: Contiguous axial images were obtained from the base of the skull through the vertex without intravenous  contrast. RADIATION DOSE REDUCTION: This exam was performed according to the departmental dose-optimization program which includes automated exposure control, adjustment of the mA and/or kV according to patient size and/or use of iterative reconstruction technique. COMPARISON:  Head CT 10/10/2023 FINDINGS: Brain: There is no evidence of an acute infarct, intracranial hemorrhage, mass, midline shift, or extra-axial fluid collection. Large chronic right MCA and moderate-sized chronic right PCA infarcts are unchanged with ex vacuo dilatation of the right lateral ventricle. There are also chronic left thalamic and bilateral cerebellar infarcts. There is a background of moderately extensive chronic small vessel ischemia in the cerebral white matter. There is moderate cerebral atrophy. Vascular: Calcified atherosclerosis at the skull base. No hyperdense vessel. Skull: Right-sided craniectomy. No acute fracture or suspicious osseous lesion. Sinuses/Orbits: The  included paranasal sinuses are clear. Small chronic left mastoid effusion. Left cataract extraction. Other: None. IMPRESSION: 1. No evidence of acute intracranial abnormality. 2. Extensive chronic ischemia with multiple old infarcts. Electronically Signed   By: Dasie Hamburg M.D.   On: 12/10/2023 17:41    Procedures Procedures    Medications Ordered in ED Medications - No data to display  ED Course/ Medical Decision Making/ A&P                          Medical Decision Making Amount and/or Complexity of Data Reviewed Labs: ordered. Decision-making details documented in ED Course.    MDM:    Decreased drainage inside the JP drain after modified radical mastectomy with new and increased leakage around the tube that is serosanguineous.  Consider possibly dislodged JP drain.  Lower c/f surgical site infection, no increased redness, swelling, induration or erythema.  No purulent drainage.  JP drain with some drainage and that is serosanguineous but  only a few cc.  Dressings including around the JP drain are wet with serosanguineous drainage.  Will obtain labs and consult to surgery.  Clinical Course as of 12/12/23 1736  [place] Dec 11, 2023  2103 Hemoglobin(!): 9.2 Decreased from 10/1 five days ago. [HN]  2103 Consulted to gen surg. [HN]  2107 Dr. Stevie will come to evaluate. [HN]  2130 Comprehensive metabolic panel(!) No acute changes [HN]  2204 Surgery came by, flushed drain, examined drain, and was able to remove a small blood clot which was likely the cultprit. He also called the patient's son and informed him of the situation. He also scheduled a nursing visit for the patient this week to have the drain checked. Both the patient and I greatly appreciate Dr. Roselynn care. Patient will be DC'd with DC instructions/return precautions, all questions answered to patient's satisfaction. [HN]    Clinical Course User Index [HN] Franklyn Sid SAILOR, MD    Labs: I Ordered, and personally interpreted labs.  The pertinent results include: Those listed above  Additional history obtained from chart review.   Reevaluation: After the interventions noted above, I reevaluated the patient and found that they have :resolved  Social Determinants of Health:  lives independently  Disposition:  DC  Co morbidities that complicate the patient evaluation  Past Medical History:  Diagnosis Date   Anemia    Cancer (HCC) 2017   Right breast   Chronic kidney disease    progression to ESRD 04/10/21   GERD (gastroesophageal reflux disease)    Glaucoma    Hemiparesis (HCC)    left side   High cholesterol    History of seizure    after a spider bite; 07/15/21   History of stroke with residual deficit    left-side weakness   Hypertension    states BP under control with meds., has been on med. x 2 yr.   Hypothyroidism    Non-insulin  dependent type 2 diabetes mellitus (HCC)    Overactive bladder    PFO (patent foramen ovale) 05/15/2021   Stroke  (HCC)    1998 weakness on left side; 05/12/21     Medicines No orders of the defined types were placed in this encounter.   I have reviewed the patients home medicines and have made adjustments as needed  Problem List / ED Course: Problem List Items Addressed This Visit   None  This note was created using dictation software, which may contain spelling or grammatical errors.    Franklyn Sid SAILOR, MD 12/12/23 [phone]

## 2023-12-11 NOTE — ED Notes (Signed)
 Son [name] [phone] would like an update asap

## 2023-12-11 NOTE — ED Notes (Signed)
 Ptar called

## 2023-12-11 NOTE — Progress Notes (Signed)
 Called because patient had drainage around her drain 3 days after mastectomy. Able to strip out a blood clot from the tubing and then got 100 ml of serosanguinous output. No further drainage noted around drain at skin. Discussed with son, Darold. We will have her come into the office for second check and education this week.

## 2023-12-15 ENCOUNTER — Encounter: Payer: Self-pay | Admitting: *Deleted

## 2023-12-16 LAB — SURGICAL PATHOLOGY

## 2023-12-19 ENCOUNTER — Encounter: Payer: Self-pay | Admitting: General Surgery

## 2023-12-22 ENCOUNTER — Inpatient Hospital Stay: Payer: 59 | Admitting: Hematology and Oncology

## 2023-12-22 NOTE — Assessment & Plan Note (Deleted)
02/15/23: Right breast nipple changes, new heterogeneous calcifications, new asymmetry right retroareolar region 2 cm area, right axilla: 1 abnormal lymph node, ill-defined hypoechoic area left breast 5.1 cm diffuse skin thickening left breast: Right breast biopsy: Grade 2 IDC ER 95%, PR 95%, Ki-67 5%, HER2 1+ negative; left breast biopsy: Benign   (07/14/2015: Right lumpectomy: IDC grade 2, 2.5 cm, 1/2 sentinel nodes positive, DCIS, LCIS, lateral inferior and deep margins positive for LCIS, T2 N1 M0 stage II a, radiation did not recommend adjuvant radiation ( at [facility]) followed by antiestrogen therapy with letrozole 08/21/19   Neoadj Anastrozole 07/21/2023: mamm and U/S: Increase in size of cancer to 4 cm, new adj mass 1 cm, 2 cm IM LN 08/22/23: CT CAP: No evidence of metastatic disease 12/08/2023: Right mastectomy: 2 tumor nodules of IDC 2.9 cm at the nipple, 1.2 cm, grade 3, intermediate grade DCIS, ALH, margins negative, 1/4 lymph nodes positive ER 95%, PR 99%, HER2 3+ positive, Ki-67 30%  Counseling: I discussed with her that the final pathology revealed that the tumor was HER2 positive.  (On the biopsy done in March 2024 it was HER2 1+)  Treatment plan: Adjuvant radiation therapy Adjuvant Herceptin with anastrozole (we will request subcutaneous Herceptin treatment every 3 weeks for 1 year).  She is not a candidate for systemic chemotherapy.  Return to clinic to start Herceptin treatments.

## 2023-12-23 ENCOUNTER — Encounter: Payer: Self-pay | Admitting: *Deleted

## 2023-12-27 ENCOUNTER — Encounter: Payer: Self-pay | Admitting: *Deleted

## 2023-12-27 ENCOUNTER — Inpatient Hospital Stay: Payer: 59 | Attending: Adult Health | Admitting: Hematology and Oncology

## 2023-12-27 VITALS — BP 109/59 | HR 94 | Temp 97.5°F | Resp 18 | Ht 64.0 in

## 2023-12-27 DIAGNOSIS — Z1731 Human epidermal growth factor receptor 2 positive status: Secondary | ICD-10-CM | POA: Diagnosis not present

## 2023-12-27 DIAGNOSIS — Z170 Estrogen receptor positive status [ER+]: Secondary | ICD-10-CM | POA: Insufficient documentation

## 2023-12-27 DIAGNOSIS — C50411 Malignant neoplasm of upper-outer quadrant of right female breast: Secondary | ICD-10-CM | POA: Diagnosis present

## 2023-12-27 DIAGNOSIS — Z79890 Hormone replacement therapy (postmenopausal): Secondary | ICD-10-CM | POA: Diagnosis not present

## 2023-12-27 DIAGNOSIS — N6489 Other specified disorders of breast: Secondary | ICD-10-CM | POA: Insufficient documentation

## 2023-12-27 DIAGNOSIS — G8918 Other acute postprocedural pain: Secondary | ICD-10-CM | POA: Insufficient documentation

## 2023-12-27 DIAGNOSIS — C519 Malignant neoplasm of vulva, unspecified: Secondary | ICD-10-CM | POA: Diagnosis not present

## 2023-12-27 DIAGNOSIS — Z1721 Progesterone receptor positive status: Secondary | ICD-10-CM | POA: Diagnosis not present

## 2023-12-27 DIAGNOSIS — Z79899 Other long term (current) drug therapy: Secondary | ICD-10-CM | POA: Diagnosis not present

## 2023-12-27 DIAGNOSIS — Z79811 Long term (current) use of aromatase inhibitors: Secondary | ICD-10-CM | POA: Diagnosis not present

## 2023-12-27 NOTE — Assessment & Plan Note (Signed)
 02/15/23: Right breast nipple changes, new heterogeneous calcifications, new asymmetry right retroareolar region 2 cm area, right axilla: 1 abnormal lymph node, ill-defined hypoechoic area left breast 5.1 cm diffuse skin thickening left breast: Right breast biopsy: Grade 2 IDC ER 95%, PR 95%, Ki-67 5%, HER2 1+ negative; left breast biopsy: Benign   (07/14/2015: Right lumpectomy: IDC grade 2, 2.5 cm, 1/2 sentinel nodes positive, DCIS, LCIS, lateral inferior and deep margins positive for LCIS, T2 N1 M0 stage II a, radiation did not recommend adjuvant radiation ( at [facility]) followed by antiestrogen therapy with letrozole 08/21/19   Neoadj Anastrozole 07/21/2023: mamm and U/S: Increase in size of cancer to 4 cm, new adj mass 1 cm, 2 cm IM LN 08/22/23: CT CAP: No evidence of metastatic disease 12/08/2023: Right mastectomy: 2 tumor nodules of IDC 2.9 cm at the nipple, 1.2 cm, grade 3, intermediate grade DCIS, ALH, margins negative, 1/4 lymph nodes positive ER 95%, PR 99%, HER2 3+ positive, Ki-67 30%  Counseling: I discussed with her that the final pathology revealed that the tumor was HER2 positive.  (On the biopsy done in March 2024 it was HER2 1+)  Treatment plan: Adjuvant radiation therapy Adjuvant Herceptin with anastrozole (we will request subcutaneous Herceptin treatment every 3 weeks for 1 year).  She is not a candidate for systemic chemotherapy.  Return to clinic to start Herceptin treatments.

## 2023-12-27 NOTE — Progress Notes (Signed)
Patient Care Team: [facility] as PCP - General Lynnette Caffey, Charlies Constable, MD as PCP - Cardiology (Cardiology) Berenice Primas, [facility] Pershing Proud, RN as Oncology Nurse Navigator Donnelly Angelica, RN as Oncology Nurse Navigator Serena Croissant, MD as Consulting Physician (Hematology and Oncology) Van Clines, MD as Consulting Physician (Neurology)  DIAGNOSIS:  Encounter Diagnosis  Name Primary?   Malignant neoplasm of upper-outer quadrant of right breast in female, estrogen receptor positive (HCC) Yes    SUMMARY OF ONCOLOGIC HISTORY: Oncology History  Malignant neoplasm of upper-outer quadrant of right breast in female, estrogen receptor positive (HCC)  03/14/2015 Initial Diagnosis   On workup for blood work cancer PET/CT showed right breast lesion, 11:00 position 2.6 cm diameter biopsy done 05/20/2015 IDC grade 2 ER 95% PR 80% HER-2 negative   07/14/2015 Surgery   Right lumpectomy: IDC grade 2, 2.5 cm, 1/2 sentinel nodes positive, DCIS, LCIS, lateral inferior and deep margins positive for LCIS, T2 N1 M0 stage II a, radiation did not recommend adjuvant radiation ( at [facility])   08/21/2015 -  Anti-estrogen oral therapy   Letrozole 2.5 mg daily   02/15/2023 Relapse/Recurrence   Right breast nipple changes, new heterogeneous calcifications, new asymmetry right retroareolar region 2 cm area, right axilla: 1 abnormal lymph node, ill-defined hypoechoic area left breast 5.1 cm diffuse skin thickening left breast: Right breast biopsy: Grade 2 IDC ER 95%, PR 95%, Ki-67 5%, HER2 1+ negative; left breast biopsy: Benign   12/08/2023 Surgery   Right mastectomy: 2 tumor nodules of IDC 2.9 cm at the nipple, 1.2 cm, grade 3, intermediate grade DCIS, ALH, margins negative, 1/4 lymph nodes positive ER 95%, PR 99%, HER2 3+ positive, Ki-67 30%   Primary vulvar squamous cell carcinoma (HCC)  03/25/2015 Surgery   Vulvectomy by Dr. Tresa Endo at [facility]; invasive squamous cell  carcinoma margins are negative, did not require any further treatment     CHIEF COMPLIANT: F/U after surgery  HISTORY OF PRESENT ILLNESS:  History of Present Illness   The patient, with a recent history of breast cancer surgery, presents with post-operative pain. She reports that the pain is improving 'kind of' and she has been taking pain medication as needed. She is unsure of her follow-up appointment with the surgeon.  The patient was informed that the surgery was successful in removing all visible cancer, which was approximately an inch in size and involved one of four lymph nodes under the arm. The cancer was found to be estrogen receptor positive and HER2 positive, indicating a potential for aggressive growth.  The patient also has a wound from the surgery that is healing slowly. She has been managing the wound at home, emptying the surgical drain daily. She is due to see the surgeon for a follow-up appointment to assess the wound and determine when the tubes can be removed.         ALLERGIES:  is allergic to contrast media [iodinated contrast media], latex, shellfish-derived products, and levemir [insulin detemir].  MEDICATIONS:  Current Outpatient Medications  Medication Sig Dispense Refill   anastrozole (ARIMIDEX) 1 MG tablet Take 1 tablet (1 mg total) by mouth daily. (Patient not taking: Reported on 12/10/2023) 90 tablet 3   aspirin EC 81 MG tablet Take 81 mg by mouth daily. Swallow whole. (Patient not taking: Reported on 12/10/2023)     calcitRIOL (ROCALTROL) 0.5 MCG capsule Take 1 capsule (0.5 mcg total) by mouth every Monday, Wednesday, and Friday with hemodialysis.  30 capsule 0   diazePAM, 15 MG Dose, (VALTOCO 15 MG DOSE) 2 x 7.5 MG/0.1ML LQPK Place 15 mg into the nose once as needed for up to 1 dose (for seizure lasting more than 2 minutes.). (Patient not taking: Reported on 12/10/2023) 2 each 0   docusate sodium (COLACE) 100 MG capsule Take 1 capsule (100 mg total) by mouth 2  (two) times daily. (Patient not taking: Reported on 12/10/2023) 20 capsule 0   donepezil (ARICEPT) 10 MG tablet Take 10 mg by mouth at bedtime.     lacosamide 150 MG TABS Take 1 tablet (150 mg total) by mouth 2 (two) times daily. (Patient taking differently: Take 150 mg by mouth See admin instructions. Take 150 mg by mouth on Sun/Tues/Thurs night(s)- before dialysis to prevent seizures (during dialysis)) 60 tablet 0   latanoprost (XALATAN) 0.005 % ophthalmic solution Place 1 drop into both eyes at bedtime. 2.5 mL 0   levothyroxine (SYNTHROID, LEVOTHROID) 112 MCG tablet Take 1 tablet (112 mcg total) by mouth daily before breakfast. 90 tablet 0   Methoxy PEG-Epoetin Beta (MIRCERA IJ) See admin instructions. 2 times a month at dialysis     metoprolol succinate (TOPROL-XL) 25 MG 24 hr tablet Take 1 tablet (25 mg total) by mouth daily. (Patient not taking: Reported on 12/10/2023) 30 tablet 0   oxyCODONE (ROXICODONE) 5 MG immediate release tablet Take 1 tablet (5 mg total) by mouth every 6 (six) hours as needed for severe pain (pain score 7-10). (Patient not taking: Reported on 12/10/2023) 10 tablet 0   sucroferric oxyhydroxide (VELPHORO) 500 MG chewable tablet Chew 500 mg by mouth 3 (three) times daily with meals.     No current facility-administered medications for this visit.    PHYSICAL EXAMINATION: ECOG PERFORMANCE STATUS: 1 - Symptomatic but completely ambulatory  Vitals:   12/27/23 1100  BP: (!) 109/59  Pulse: 94  Resp: 18  Temp: (!) 97.5 F (36.4 C)  SpO2: 98%   Filed Weights    Physical Exam          (exam performed in the presence of a chaperone)  LABORATORY DATA:  I have reviewed the data as listed    Latest Ref Rng & Units 12/11/2023    8:44 PM 12/10/2023    5:49 PM 12/08/2023    7:57 AM  CMP  Glucose 70 - 99 mg/dL 161  096  045   BUN 8 - 23 mg/dL 36  23  54   Creatinine 0.44 - 1.00 mg/dL 4.09  8.11  91.47   Sodium 135 - 145 mmol/L 136  135  138   Potassium 3.5 - 5.1 mmol/L 3.9   3.2  3.9   Chloride 98 - 111 mmol/L 93  92  95   CO2 22 - 32 mmol/L 26  27  25    Calcium 8.9 - 10.3 mg/dL 8.5  8.6  9.0   Total Protein 6.5 - 8.1 g/dL 7.0     Total Bilirubin 0.0 - 1.2 mg/dL 0.8     Alkaline Phos 38 - 126 U/L 56     AST 15 - 41 U/L 24     ALT 0 - 44 U/L <5       Lab Results  Component Value Date   WBC 8.8 12/11/2023   HGB 9.2 (L) 12/11/2023   HCT 28.9 (L) 12/11/2023   MCV 103.2 (H) 12/11/2023   PLT 339 12/11/2023   NEUTROABS 6.2 12/11/2023    ASSESSMENT & PLAN:  Malignant neoplasm of upper-outer quadrant of right breast in female, estrogen receptor positive (HCC) 02/15/23: Right breast nipple changes, new heterogeneous calcifications, new asymmetry right retroareolar region 2 cm area, right axilla: 1 abnormal lymph node, ill-defined hypoechoic area left breast 5.1 cm diffuse skin thickening left breast: Right breast biopsy: Grade 2 IDC ER 95%, PR 95%, Ki-67 5%, HER2 1+ negative; left breast biopsy: Benign   (07/14/2015: Right lumpectomy: IDC grade 2, 2.5 cm, 1/2 sentinel nodes positive, DCIS, LCIS, lateral inferior and deep margins positive for LCIS, T2 N1 M0 stage II a, radiation did not recommend adjuvant radiation ( at [facility]) followed by antiestrogen therapy with letrozole 08/21/19   Neoadj Anastrozole 07/21/2023: mamm and U/S: Increase in size of cancer to 4 cm, new adj mass 1 cm, 2 cm IM LN 08/22/23: CT CAP: No evidence of metastatic disease 12/08/2023: Right mastectomy: 2 tumor nodules of IDC 2.9 cm at the nipple, 1.2 cm, grade 3, intermediate grade DCIS, ALH, margins negative, 1/4 lymph nodes positive ER 95%, PR 99%, HER2 3+ positive, Ki-67 30%  Counseling: I discussed with her that the final pathology revealed that the tumor was HER2 positive.  (On the biopsy done in March 2024 it was HER2 1+)  Treatment plan: Adjuvant radiation therapy: Patient does not want XRT Adjuvant Herceptin with anastrozole (we will request subcutaneous Herceptin treatment  every 3 weeks for 1 year).  She is not a candidate for systemic chemotherapy.  Return to clinic to start Herceptin treatments. ------------------------------------- Assessment and Plan    Breast Cancer Successful surgical removal of 2.9 cm tumor with one out of four lymph nodes positive for cancer. The cancer is estrogen receptor positive and HER2 positive. -Start Anastrozole 1mg  daily for 5 years for estrogen receptor positive cancer. -Plan to start Herceptin injections every three weeks for one year for HER2 positive cancer, pending insurance approval. -Perform echocardiogram every three months during Herceptin treatment.  Post-operative Pain Patient reports pain following surgery, taking pain medication as needed. -Continue current pain management regimen. -Follow up with surgeon for wound care and pain management.  Follow-up Unclear when next appointment with surgeon is scheduled. -Nurse to confirm date of next appointment with surgeon.          No orders of the defined types were placed in this encounter.  The patient has a good understanding of the overall plan. she agrees with it. she will call with any problems that may develop before the next visit here. Total time spent: 30 mins including face to face time and time spent for planning, charting and co-ordination of care   Tamsen Meek, MD 12/27/23

## 2023-12-27 NOTE — Progress Notes (Signed)
START OFF PATHWAY REGIMEN - Breast   [id number]:Trastuzumab and hyaluronidase-oysk 600 mg/10,000 units SUBQ D1 q21 Days:   A cycle is every 21 days:     Trastuzumab and hyaluronidase-oysk   **Always confirm dose/schedule in your pharmacy ordering system**  Patient Characteristics: Postoperative without Neoadjuvant Therapy, M0 (Pathologic Staging), Invasive Disease, Adjuvant Therapy, HER2 Positive, ER Positive, Node Positive, pT2, pN1a or Higher Therapeutic Status: Postoperative without Neoadjuvant Therapy, M0 (Pathologic Staging) AJCC Grade: G3 AJCC N Category: pN1 AJCC M Category: cM0 ER Status: Positive (+) AJCC 8 Stage Grouping: IB HER2 Status: Positive (+) Oncotype Dx Recurrence Score: Not Appropriate AJCC T Category: pT2 PR Status: Positive (+) Intent of Therapy: Curative Intent, Discussed with Patient

## 2023-12-28 ENCOUNTER — Telehealth: Payer: Self-pay | Admitting: *Deleted

## 2023-12-28 NOTE — Telephone Encounter (Signed)
I called patient ( I spoke with son Kirsten Mcmahon) to scheduled appointments ; son states at this time do not wish to continue with treatment. Secured chat Dr. Pamelia Hoit and Nurse Hermine Messick) of update. Kirsten Mcmahon states will call and make note of decision of treatment.

## 2023-12-28 NOTE — Telephone Encounter (Signed)
This nurse was informed by scheduler that pt son [name] did not want to proceed with injection for mom. Pt son was called to get clear understanding of why pt wanted to decline injection. Per pt son, "I decided and I did some research myself and decided if she eats healthier she can take the pill and not the injection. She is already on dialysis and I don't want to put more on her." He than asked this nurse what was my view. I advised the pt son on the benefits of the injection and antiestrogen working together to reduce risk of reoccurrence along with healthy diet as well as his mother having a quality of life. Pt son agreed to mother taking injection. Advised that we are here to aid in any way that we can. Pt sone verbalized understanding. Scheduling message was sent to proceed with calling pt son to set up injection appts.

## 2023-12-29 ENCOUNTER — Other Ambulatory Visit: Payer: Self-pay

## 2024-01-03 ENCOUNTER — Other Ambulatory Visit: Payer: Self-pay | Admitting: Hematology and Oncology

## 2024-01-03 DIAGNOSIS — Z170 Estrogen receptor positive status [ER+]: Secondary | ICD-10-CM

## 2024-01-03 NOTE — Progress Notes (Signed)
 Pharmacist Chemotherapy Monitoring - Initial Assessment    Anticipated start date: 01/09/24   The following has been reviewed per standard work regarding the patient's treatment regimen: The patient's diagnosis, treatment plan and drug doses, and organ/hematologic function Lab orders and baseline tests specific to treatment regimen  The treatment plan start date, drug sequencing, and pre-medications Prior authorization status  Patient's documented medication list, including drug-drug interaction screen and prescriptions for anti-emetics and supportive care specific to the treatment regimen The drug concentrations, fluid compatibility, administration routes, and timing of the medications to be used The patient's access for treatment and lifetime cumulative dose history, if applicable  The patient's medication allergies and previous infusion related reactions, if applicable   Changes made to treatment plan:  N/A  Follow up needed:  Pending authorization for treatment    Marlee Eleanor Neighbors, RPH, 01/03/2024  12:19 PM

## 2024-01-05 ENCOUNTER — Encounter: Payer: Self-pay | Admitting: Hematology and Oncology

## 2024-01-05 ENCOUNTER — Encounter: Payer: Self-pay | Admitting: *Deleted

## 2024-01-05 ENCOUNTER — Other Ambulatory Visit: Payer: Self-pay | Admitting: *Deleted

## 2024-01-05 MED ORDER — ANASTROZOLE 1 MG PO TABS: 1.0000 mg | ORAL_TABLET | Freq: Every day | ORAL | 3 refills | Status: AC

## 2024-01-05 NOTE — Telephone Encounter (Signed)
Received a message that patient had never received her anastrazole.  Verified pharmacy and prescription sent.

## 2024-01-06 ENCOUNTER — Encounter: Payer: Self-pay | Admitting: Hematology and Oncology

## 2024-01-07 ENCOUNTER — Encounter: Payer: Self-pay | Admitting: Hematology and Oncology

## 2024-01-10 ENCOUNTER — Telehealth: Payer: Self-pay

## 2024-01-10 ENCOUNTER — Telehealth: Payer: Self-pay | Admitting: *Deleted

## 2024-01-10 ENCOUNTER — Inpatient Hospital Stay: Payer: 59 | Attending: Adult Health | Admitting: Adult Health

## 2024-01-10 ENCOUNTER — Inpatient Hospital Stay: Payer: 59 | Attending: Adult Health

## 2024-01-10 NOTE — Progress Notes (Deleted)
 [facility] Cancer Follow up:    [facility] [address] [place] [address]   DIAGNOSIS: Cancer Staging  No matching staging information was found for the patient.   SUMMARY OF ONCOLOGIC HISTORY: Oncology History  Malignant neoplasm of upper-outer quadrant of right breast in female, estrogen receptor positive (HCC)  03/14/2015 Initial Diagnosis   On workup for blood work cancer PET/CT showed right breast lesion, 11:00 position 2.6 cm diameter biopsy done 05/20/2015 IDC grade 2 ER 95% PR 80% HER-2 negative   07/14/2015 Surgery   Right lumpectomy: IDC grade 2, 2.5 cm, 1/2 sentinel nodes positive, DCIS, LCIS, lateral inferior and deep margins positive for LCIS, T2 N1 M0 stage II a, radiation did not recommend adjuvant radiation ( at [facility])   08/21/2015 -  Anti-estrogen oral therapy   Letrozole  2.5 mg daily   02/15/2023 Relapse/Recurrence   Right breast nipple changes, new heterogeneous calcifications, new asymmetry right retroareolar region 2 cm area, right axilla: 1 abnormal lymph node, ill-defined hypoechoic area left breast 5.1 cm diffuse skin thickening left breast: Right breast biopsy: Grade 2 IDC ER 95%, PR 95%, Ki-67 5%, HER2 1+ negative; left breast biopsy: Benign   12/08/2023 Surgery   Right mastectomy: 2 tumor nodules of IDC 2.9 cm at the nipple, 1.2 cm, grade 3, intermediate grade DCIS, ALH, margins negative, 1/4 lymph nodes positive ER 95%, PR 99%, HER2 3+ positive, Ki-67 30%   01/10/2024 -  Chemotherapy   Patient is on Treatment Plan : BREAST MAINTENANCE Trastuzumab IV (6) or SQ (600) D1 q21d x 13 cycles     Primary vulvar squamous cell carcinoma (HCC)  03/25/2015 Surgery   Vulvectomy by Dr. Burnard at [facility]; invasive squamous cell carcinoma margins are negative, did not require any further treatment     CURRENT THERAPY: Herceptin  INTERVAL HISTORY:  Discussed the use of AI scribe software for clinical note  transcription with the patient, who gave verbal consent to proceed.  Kirsten Mcmahon 78 y.o. female returns for   Echo 10/05/2023 EF of 60-65%   Patient Active Problem List   Diagnosis Date Noted   Cancer of right female breast (HCC) 12/08/2023   Seizures (HCC) 05/31/2023   History of breast cancer 05/31/2023   History of CVA (cerebrovascular accident) 05/31/2023   Delirium due to known physiological condition 08/03/2021   Seizure (HCC) 07/17/2021   Mixed diabetic hyperlipidemia associated with type 2 diabetes mellitus (HCC) 07/16/2021   Recurrent seizures (HCC) 07/15/2021   Encounter for immunization 07/14/2021   Pain, unspecified 07/13/2021   Hypokalemia 06/02/2021   Other pneumonia, unspecified organism 05/26/2021   Recent cerebrovascular accident (CVA) 05/17/2021   History of CVA with residual deficit 05/17/2021   Cerebral embolism with cerebral infarction 05/13/2021   Physical deconditioning 04/30/2021   Generalized muscle weakness 04/30/2021   Pressure injury of skin 04/29/2021   AKI (acute kidney injury) (HCC) 04/28/2021   Moderate protein-calorie malnutrition (HCC) 04/28/2021   Other bacterial infections of unspecified site 04/28/2021   Streptococcal sepsis, unspecified (HCC) 04/18/2021   Streptococcal bacteremia 04/18/2021   Leukocytosis 04/17/2021   Fever 04/17/2021   Allergy, unspecified, initial encounter 04/16/2021   Anaphylactic shock, unspecified, initial encounter 04/16/2021   Complication of vascular dialysis catheter 04/16/2021   Iron deficiency anemia, unspecified 04/16/2021   Pruritus, unspecified 04/16/2021   Secondary hyperparathyroidism of renal origin (HCC) 04/16/2021   Type 2 diabetes mellitus with diabetic peripheral angiopathy without gangrene (HCC) 04/16/2021  End-stage renal disease on hemodialysis (HCC)    Acute kidney injury superimposed on CKD (HCC) 04/09/2021   ARF (acute renal failure) (HCC) 01/18/2021   Fall 01/17/2021   Essential  hypertension    Hypothyroidism    Stroke (HCC)    GERD (gastroesophageal reflux disease)    Anemia in chronic kidney disease (CKD)    Acute metabolic encephalopathy    Acute renal failure superimposed on stage 3b chronic kidney disease (HCC)    Type 2 diabetes mellitus with ESRD (end-stage renal disease) (HCC)    Abnormal mammogram of left breast 07/30/2018   Closed displaced oblique fracture of shaft of left humerus 03/14/2018   Osteopenia 01/20/2018   Multiple thyroid  nodules 07/13/2017   Tracheal deviation 07/13/2017   Malignant neoplasm of upper-outer quadrant of right breast in female, estrogen receptor positive (HCC) 01/20/2017   Primary vulvar squamous cell carcinoma (HCC) 01/20/2017    is allergic to contrast media [iodinated contrast media], latex, shellfish-derived products, and levemir [insulin  detemir].  MEDICAL HISTORY: Past Medical History:  Diagnosis Date   Anemia    Cancer (HCC) 2017   Right breast   Chronic kidney disease    progression to ESRD 04/10/21   GERD (gastroesophageal reflux disease)    Glaucoma    Hemiparesis (HCC)    left side   High cholesterol    History of seizure    after a spider bite; 07/15/21   History of stroke with residual deficit    left-side weakness   Hypertension    states BP under control with meds., has been on med. x 2 yr.   Hypothyroidism    Non-insulin  dependent type 2 diabetes mellitus (HCC)    Overactive bladder    PFO (patent foramen ovale) 05/15/2021   Stroke (HCC)    1998 weakness on left side; 05/12/21    SURGICAL HISTORY: Past Surgical History:  Procedure Laterality Date   ABDOMINAL HYSTERECTOMY     complete   AV FISTULA PLACEMENT Right 09/29/2021   Procedure: INSERTION OF RIGHT ARM ARTERIOVENOUS (AV) GORE-TEX GRAFT;  Surgeon: Eliza Lonni RAMAN, MD;  Location: [facility] OR;  Service: Vascular;  Laterality: Right;   BREAST BIOPSY Left 08/03/2018   Benign adipose tissue   BREAST BIOPSY Right 02/15/2023   US  RT BREAST  BX W LOC DEV 1ST LESION IMG BX SPEC US  GUIDE 02/15/2023 [facility] MAMMOGRAPHY   BREAST BIOPSY Left 02/15/2023   US  LT BREAST BX W LOC DEV 1ST LESION IMG BX SPEC US  GUIDE 02/15/2023 [facility] MAMMOGRAPHY   BREAST EXCISIONAL BIOPSY Right 2014   Positive   BREAST LUMPECTOMY Right    BUBBLE STUDY  05/15/2021   Procedure: BUBBLE STUDY;  Surgeon: Jeffrie Oneil BROCKS, MD;  Location: [facility] ENDOSCOPY;  Service: Cardiovascular;;   CATARACT EXTRACTION W/ INTRAOCULAR LENS IMPLANT Left    CEREBRAL ANEURYSM REPAIR  1998   DIALYSIS/PERMA CATHETER INSERTION N/A 04/10/2021   Procedure: DIALYSIS/PERMA CATHETER INSERTION;  Surgeon: Marea Selinda RAMAN, MD;  Location: [facility] INVASIVE CV LAB;  Service: Cardiovascular;  Laterality: N/A;   IR FLUORO GUIDE CV LINE RIGHT  04/30/2021   MASTECTOMY MODIFIED RADICAL Right 12/08/2023   Procedure: RIGHT MODIFIED RADICAL MASTECTOMY;  Surgeon: Curvin Deward MOULD, MD;  Location: [facility] ORS;  Service: General;  Laterality: Right;   PICC LINE INSERTION     TEE WITHOUT CARDIOVERSION N/A 05/15/2021   Procedure: TRANSESOPHAGEAL ECHOCARDIOGRAM (TEE);  Surgeon: Jeffrie Oneil BROCKS, MD;  Location: [facility] ENDOSCOPY;  Service: Cardiovascular;  Laterality: N/A;   THYROID  LOBECTOMY  Right 07/15/2017   Procedure: RIGHT THYROID  LOBECTOMY;  Surgeon: Eletha Boas, MD;  Location: [facility] OR;  Service: General;  Laterality: Right;    SOCIAL HISTORY: Social History   Socioeconomic History   Marital status: Single    Spouse name: Not on file   Number of children: Not on file   Years of education: Not on file   Highest education level: Not on file  Occupational History   Not on file  Tobacco Use   Smoking status: Never   Smokeless tobacco: Never  Vaping Use   Vaping status: Never Used  Substance and Sexual Activity   Alcohol  use: No   Drug use: No   Sexual activity: Never    Birth control/protection: Post-menopausal  Other Topics Concern   Not on file  Social History Narrative   Lives with son and wife   Right handed    Can not  use right arm for BP    Social Drivers of Corporate Investment Banker Strain: Not on file  Food Insecurity: Patient Declined (12/08/2023)   Hunger Vital Sign    Worried About Running Out of Food in the Last Year: Patient declined    Ran Out of Food in the Last Year: Patient declined  Transportation Needs: Patient Declined (12/08/2023)   PRAPARE - Administrator, Civil Service (Medical): Patient declined    Lack of Transportation (Non-Medical): Patient declined  Physical Activity: Not on file  Stress: Not on file  Social Connections: Patient Declined (12/08/2023)   Social Connection and Isolation Panel [NHANES]    Frequency of Communication with Friends and Family: Patient declined    Frequency of Social Gatherings with Friends and Family: Patient declined    Attends Religious Services: Patient declined    Database Administrator or Organizations: Patient declined    Attends Banker Meetings: Patient declined    Marital Status: Patient declined  Intimate Partner Violence: Patient Declined (12/08/2023)   Humiliation, Afraid, Rape, and Kick questionnaire    Fear of Current or Ex-Partner: Patient declined    Emotionally Abused: Patient declined    Physically Abused: Patient declined    Sexually Abused: Patient declined    FAMILY HISTORY: Family History  Problem Relation Age of Onset   Cancer Brother        possible prostate cancer per her daughter   Breast cancer Neg Hx     Review of Systems  Constitutional:  Negative for appetite change, chills, fatigue, fever and unexpected weight change.  HENT:   Negative for hearing loss, lump/mass and trouble swallowing.   Eyes:  Negative for eye problems and icterus.  Respiratory:  Negative for chest tightness, cough and shortness of breath.   Cardiovascular:  Negative for chest pain, leg swelling and palpitations.  Gastrointestinal:  Negative for abdominal distention, abdominal pain, constipation, diarrhea, nausea and  vomiting.  Endocrine: Negative for hot flashes.  Genitourinary:  Negative for difficulty urinating.   Musculoskeletal:  Negative for arthralgias.  Skin:  Negative for itching and rash.  Neurological:  Negative for dizziness, extremity weakness, headaches and numbness.  Hematological:  Negative for adenopathy. Does not bruise/bleed easily.  Psychiatric/Behavioral:  Negative for depression. The patient is not nervous/anxious.       PHYSICAL EXAMINATION    There were no vitals filed for this visit.  Physical Exam Constitutional:      General: She is not in acute distress.    Appearance: Normal appearance. She is not toxic-appearing.  HENT:     Head: Normocephalic and atraumatic.     Mouth/Throat:     Mouth: Mucous membranes are moist.     Pharynx: Oropharynx is clear. No oropharyngeal exudate or posterior oropharyngeal erythema.  Eyes:     General: No scleral icterus. Cardiovascular:     Rate and Rhythm: Normal rate and regular rhythm.     Pulses: Normal pulses.     Heart sounds: Normal heart sounds.  Pulmonary:     Effort: Pulmonary effort is normal.     Breath sounds: Normal breath sounds.  Abdominal:     General: Abdomen is flat. Bowel sounds are normal. There is no distension.     Palpations: Abdomen is soft.     Tenderness: There is no abdominal tenderness.  Musculoskeletal:        General: No swelling.     Cervical back: Neck supple.  Lymphadenopathy:     Cervical: No cervical adenopathy.  Skin:    General: Skin is warm and dry.     Findings: No rash.  Neurological:     General: No focal deficit present.     Mental Status: She is alert.  Psychiatric:        Mood and Affect: Mood normal.        Behavior: Behavior normal.     LABORATORY DATA:  CBC    Component Value Date/Time   WBC 8.8 12/11/2023 2044   RBC 2.80 (L) 12/11/2023 2044   HGB 9.2 (L) 12/11/2023 2044   HCT 28.9 (L) 12/11/2023 2044   PLT 339 12/11/2023 2044   MCV 103.2 (H) 12/11/2023 2044    MCH 32.9 12/11/2023 2044   MCHC 31.8 12/11/2023 2044   RDW 13.2 12/11/2023 2044   LYMPHSABS 1.4 12/11/2023 2044   MONOABS 0.8 12/11/2023 2044   EOSABS 0.3 12/11/2023 2044   BASOSABS 0.1 12/11/2023 2044    CMP     Component Value Date/Time   NA 136 12/11/2023 2044   K 3.9 12/11/2023 2044   CL 93 (L) 12/11/2023 2044   CO2 26 12/11/2023 2044   GLUCOSE 155 (H) 12/11/2023 2044   BUN 36 (H) 12/11/2023 2044   CREATININE 9.82 (H) 12/11/2023 2044   CALCIUM  8.5 (L) 12/11/2023 2044   PROT 7.0 12/11/2023 2044   ALBUMIN  2.9 (L) 12/11/2023 2044   AST 24 12/11/2023 2044   ALT <5 12/11/2023 2044   ALKPHOS 56 12/11/2023 2044   BILITOT 0.8 12/11/2023 2044   GFRNONAA 4 (L) 12/11/2023 2044   GFRAA 34 (L) 08/01/2018 1056          ASSESSMENT and THERAPY PLAN:   No problem-specific Assessment & Plan notes found for this encounter.   All questions were answered. The patient knows to call the clinic with any problems, questions or concerns. We can certainly see the patient much sooner if necessary.  Total encounter time:*** minutes*in face-to-face visit time, chart review, lab review, care coordination, order entry, and documentation of the encounter time.    Morna Kendall, NP 01/10/24 7:09 AM Medical Oncology and Hematology [facility] [address] [place], [place] [address] Tel. [phone]    Fax. [phone]  *Total Encounter Time as defined by the Centers for Medicare and Medicaid Services includes, in addition to the face-to-face time of a patient visit (documented in the note above) non-face-to-face time: obtaining and reviewing outside history, ordering and reviewing medications, tests or procedures, care coordination (communications with other health care professionals or caregivers) and documentation  in the medical record.

## 2024-01-10 NOTE — Telephone Encounter (Signed)
Called pt about scheduled appt Son states pt will not be doing treatment and will reach out to Dr. Pamelia Hoit to discuss further. I advise pt son to call this evening. Son verbalized understanding.

## 2024-01-10 NOTE — Telephone Encounter (Signed)
 Received message from CMA that pt was contacted regarding missed appt today.  Pt son informed CMA that pt will not be undergoing chemo tx and would like to cancel all appts.  RN attempt x1 to f/u with pt son and scheduled MD f/u.  No answer, LVM for son to return call to the office.

## 2024-01-18 ENCOUNTER — Inpatient Hospital Stay (HOSPITAL_BASED_OUTPATIENT_CLINIC_OR_DEPARTMENT_OTHER): Payer: 59 | Admitting: Hematology and Oncology

## 2024-01-18 DIAGNOSIS — Z170 Estrogen receptor positive status [ER+]: Secondary | ICD-10-CM | POA: Diagnosis not present

## 2024-01-18 DIAGNOSIS — C50411 Malignant neoplasm of upper-outer quadrant of right female breast: Secondary | ICD-10-CM | POA: Diagnosis not present

## 2024-01-18 NOTE — Progress Notes (Signed)
HEMATOLOGY-ONCOLOGY TELEPHONE VISIT PROGRESS NOTE  I connected with our patient on 01/18/24 at  8:15 AM EST by telephone and verified that I am speaking with the correct person using two identifiers.  I discussed the limitations, risks, security and privacy concerns of performing an evaluation and management service by telephone and the availability of in person appointments.  I also discussed with the patient that there may be a patient responsible charge related to this service. The patient expressed understanding and agreed to proceed.   History of Present Illness:   History of Present Illness   Kirsten Mcmahon is a 78 year old female with breast cancer who presents for discussion regarding her treatment plan. She is accompanied by her son, who is involved in her care decisions.  She is currently undergoing treatment for breast cancer and has decided, along with her son, to discontinue Herceptin injections due to the burden of treatment. She is also receiving dialysis three times a week, which she feels is too taxing.  She has been taking an anti-estrogen pill as part of her treatment regimen and has been tolerating the medication well with no side effects.  She maintains a healthy diet, avoiding foods like chicken and restaurant meals, and focuses on natural foods.        Oncology History  Malignant neoplasm of upper-outer quadrant of right breast in female, estrogen receptor positive (HCC)  03/14/2015 Initial Diagnosis   On workup for blood work cancer PET/CT showed right breast lesion, 11:00 position 2.6 cm diameter biopsy done 05/20/2015 IDC grade 2 ER 95% PR 80% HER-2 negative   07/14/2015 Surgery   Right lumpectomy: IDC grade 2, 2.5 cm, 1/2 sentinel nodes positive, DCIS, LCIS, lateral inferior and deep margins positive for LCIS, T2 N1 M0 stage II a, radiation did not recommend adjuvant radiation ( at [facility])   08/21/2015 -  Anti-estrogen oral therapy   Letrozole 2.5 mg  daily   02/15/2023 Relapse/Recurrence   Right breast nipple changes, new heterogeneous calcifications, new asymmetry right retroareolar region 2 cm area, right axilla: 1 abnormal lymph node, ill-defined hypoechoic area left breast 5.1 cm diffuse skin thickening left breast: Right breast biopsy: Grade 2 IDC ER 95%, PR 95%, Ki-67 5%, HER2 1+ negative; left breast biopsy: Benign   12/08/2023 Surgery   Right mastectomy: 2 tumor nodules of IDC 2.9 cm at the nipple, 1.2 cm, grade 3, intermediate grade DCIS, ALH, margins negative, 1/4 lymph nodes positive ER 95%, PR 99%, HER2 3+ positive, Ki-67 30%   01/11/2024 - 01/11/2024 Chemotherapy   Patient is on Treatment Plan : BREAST MAINTENANCE Trastuzumab IV (6) or SQ (600) D1 q21d x 13 cycles     Primary vulvar squamous cell carcinoma (HCC)  03/25/2015 Surgery   Vulve[facility] by Dr. Tresa Endo at [facility]; invasive squamous cell carcinoma margins are negative, did not require any further treatment     REVIEW OF SYSTEMS:   Constitutional: Denies fevers, chills or abnormal weight loss All other systems were reviewed with the patient and are negative. Observations/Objective:     Assessment Plan:  Malignant neoplasm of upper-outer quadrant of right breast in female, estrogen receptor positive (HCC) 02/15/23: Right breast nipple changes, new heterogeneous calcifications, new asymmetry right retroareolar region 2 cm area, right axilla: 1 abnormal lymph node, ill-defined hypoechoic area left breast 5.1 cm diffuse skin thickening left breast: Right breast biopsy: Grade 2 IDC ER 95%, PR 95%, Ki-67 5%, HER2 1+ negative; left breast biopsy: Benign   (07/14/2015:  Right lumpectomy: IDC grade 2, 2.5 cm, 1/2 sentinel nodes positive, DCIS, LCIS, lateral inferior and deep margins positive for LCIS, T2 N1 M0 stage II a, radiation did not recommend adjuvant radiation ( at [facility]) followed by antiestrogen therapy with letrozole 08/21/19   Neoadj Anastrozole 07/21/2023: mamm  and U/S: Increase in size of cancer to 4 cm, new adj mass 1 cm, 2 cm IM LN 08/22/23: CT CAP: No evidence of metastatic disease 12/08/2023: Right mastectomy: 2 tumor nodules of IDC 2.9 cm at the nipple, 1.2 cm, grade 3, intermediate grade DCIS, ALH, margins negative, 1/4 lymph nodes positive ER 95%, PR 99%, HER2 3+ positive, Ki-67 30%   Counseling: I discussed with her that the final pathology revealed that the tumor was HER2 positive.  (On the biopsy done in March 2024 it was HER2 1+)   Treatment plan: Adjuvant radiation therapy: Patient does not want XRT Adjuvant anastrozole. She is not a candidate for systemic chemotherapy.  We recommended Herceptin but after much thought she and her son decided that they did not want to receive Herceptin. -------------------------------------------------------------------------------------------------------------------------------------- Current treatment: Anastrozole 1 mg daily started 01/05/2024 She is tolerating anastrozole extremely well.  Goals of Care The family has decided to prioritize quality of life and reduce treatment burden by discontinuing Herceptin. The focus will be on maintaining her current health status with hormone therapy and a healthy diet, given her concurrent dialysis treatments. - Discontinue Herceptin - Continue anti-estrogen hormone therapy - Maintain a healthy diet.     Mammograms in August and telephone visit follow-up in 1 year  I discussed the assessment and treatment plan with the patient. The patient was provided an opportunity to ask questions and all were answered. The patient agreed with the plan and demonstrated an understanding of the instructions. The patient was advised to call back or seek an in-person evaluation if the symptoms worsen or if the condition fails to improve as anticipated.   I provided 20 minutes of non-face-to-face time during this encounter.  This includes time for charting and coordination of care    Tamsen Meek, MD

## 2024-01-18 NOTE — Assessment & Plan Note (Signed)
02/15/23: Right breast nipple changes, new heterogeneous calcifications, new asymmetry right retroareolar region 2 cm area, right axilla: 1 abnormal lymph node, ill-defined hypoechoic area left breast 5.1 cm diffuse skin thickening left breast: Right breast biopsy: Grade 2 IDC ER 95%, PR 95%, Ki-67 5%, HER2 1+ negative; left breast biopsy: Benign   (07/14/2015: Right lumpectomy: IDC grade 2, 2.5 cm, 1/2 sentinel nodes positive, DCIS, LCIS, lateral inferior and deep margins positive for LCIS, T2 N1 M0 stage II a, radiation did not recommend adjuvant radiation ( at [facility]) followed by antiestrogen therapy with letrozole 08/21/19   Neoadj Anastrozole 07/21/2023: mamm and U/S: Increase in size of cancer to 4 cm, new adj mass 1 cm, 2 cm IM LN 08/22/23: CT CAP: No evidence of metastatic disease 12/08/2023: Right mastectomy: 2 tumor nodules of IDC 2.9 cm at the nipple, 1.2 cm, grade 3, intermediate grade DCIS, ALH, margins negative, 1/4 lymph nodes positive ER 95%, PR 99%, HER2 3+ positive, Ki-67 30%   Counseling: I discussed with her that the final pathology revealed that the tumor was HER2 positive.  (On the biopsy done in March 2024 it was HER2 1+)   Treatment plan: Adjuvant radiation therapy: Patient does not want XRT Adjuvant Herceptin with anastrozole (we will request subcutaneous Herceptin treatment every 3 weeks for 1 year).  She is not a candidate for systemic chemotherapy. -------------------------------------------------------------------------------------------------------------------------------------- Current treatment:   Patient is not interested in receiving Herceptin treatments.

## 2024-01-20 ENCOUNTER — Telehealth: Payer: Self-pay | Admitting: Hematology and Oncology

## 2024-01-20 NOTE — Telephone Encounter (Signed)
Scheduled appointment per 2/12 los. Left VM with appointment details.

## 2024-01-31 ENCOUNTER — Ambulatory Visit: Payer: 59 | Admitting: Hematology and Oncology

## 2024-01-31 ENCOUNTER — Ambulatory Visit: Payer: 59

## 2024-02-02 ENCOUNTER — Encounter: Payer: Self-pay | Admitting: *Deleted

## 2024-02-02 DIAGNOSIS — Z170 Estrogen receptor positive status [ER+]: Secondary | ICD-10-CM

## 2024-02-06 ENCOUNTER — Emergency Department (HOSPITAL_COMMUNITY)

## 2024-02-06 ENCOUNTER — Inpatient Hospital Stay (HOSPITAL_COMMUNITY)
Admission: EM | Admit: 2024-02-06 | Discharge: 2024-02-14 | DRG: 689 | Disposition: A | Discharge status: Home Health | Attending: Internal Medicine | Admitting: Internal Medicine

## 2024-02-06 ENCOUNTER — Encounter (HOSPITAL_COMMUNITY): Payer: Self-pay

## 2024-02-06 ENCOUNTER — Other Ambulatory Visit: Payer: Self-pay

## 2024-02-06 DIAGNOSIS — N186 End stage renal disease: Secondary | ICD-10-CM | POA: Diagnosis present

## 2024-02-06 DIAGNOSIS — Z9011 Acquired absence of right breast and nipple: Secondary | ICD-10-CM

## 2024-02-06 DIAGNOSIS — G40909 Epilepsy, unspecified, not intractable, without status epilepticus: Secondary | ICD-10-CM | POA: Diagnosis present

## 2024-02-06 DIAGNOSIS — Z888 Allergy status to other drugs, medicaments and biological substances status: Secondary | ICD-10-CM

## 2024-02-06 DIAGNOSIS — Z853 Personal history of malignant neoplasm of breast: Secondary | ICD-10-CM

## 2024-02-06 DIAGNOSIS — Z961 Presence of intraocular lens: Secondary | ICD-10-CM | POA: Diagnosis present

## 2024-02-06 DIAGNOSIS — F0390 Unspecified dementia without behavioral disturbance: Secondary | ICD-10-CM | POA: Diagnosis present

## 2024-02-06 DIAGNOSIS — N390 Urinary tract infection, site not specified: Principal | ICD-10-CM | POA: Diagnosis present

## 2024-02-06 DIAGNOSIS — Z91041 Radiographic dye allergy status: Secondary | ICD-10-CM

## 2024-02-06 DIAGNOSIS — D631 Anemia in chronic kidney disease: Secondary | ICD-10-CM | POA: Diagnosis present

## 2024-02-06 DIAGNOSIS — E1122 Type 2 diabetes mellitus with diabetic chronic kidney disease: Secondary | ICD-10-CM | POA: Diagnosis present

## 2024-02-06 DIAGNOSIS — Z1732 Human epidermal growth factor receptor 2 negative status: Secondary | ICD-10-CM

## 2024-02-06 DIAGNOSIS — B9620 Unspecified Escherichia coli [E. coli] as the cause of diseases classified elsewhere: Secondary | ICD-10-CM | POA: Diagnosis present

## 2024-02-06 DIAGNOSIS — Z91013 Allergy to seafood: Secondary | ICD-10-CM

## 2024-02-06 DIAGNOSIS — N3000 Acute cystitis without hematuria: Secondary | ICD-10-CM

## 2024-02-06 DIAGNOSIS — G9340 Encephalopathy, unspecified: Secondary | ICD-10-CM | POA: Diagnosis present

## 2024-02-06 DIAGNOSIS — M80022A Age-related osteoporosis with current pathological fracture, left humerus, initial encounter for fracture: Secondary | ICD-10-CM | POA: Diagnosis present

## 2024-02-06 DIAGNOSIS — Z7984 Long term (current) use of oral hypoglycemic drugs: Secondary | ICD-10-CM

## 2024-02-06 DIAGNOSIS — D62 Acute posthemorrhagic anemia: Secondary | ICD-10-CM

## 2024-02-06 DIAGNOSIS — K5900 Constipation, unspecified: Secondary | ICD-10-CM | POA: Diagnosis present

## 2024-02-06 DIAGNOSIS — G9341 Metabolic encephalopathy: Secondary | ICD-10-CM | POA: Diagnosis present

## 2024-02-06 DIAGNOSIS — S42402A Unspecified fracture of lower end of left humerus, initial encounter for closed fracture: Secondary | ICD-10-CM

## 2024-02-06 DIAGNOSIS — Z8673 Personal history of transient ischemic attack (TIA), and cerebral infarction without residual deficits: Secondary | ICD-10-CM

## 2024-02-06 DIAGNOSIS — Z79811 Long term (current) use of aromatase inhibitors: Secondary | ICD-10-CM

## 2024-02-06 DIAGNOSIS — N2581 Secondary hyperparathyroidism of renal origin: Secondary | ICD-10-CM | POA: Diagnosis present

## 2024-02-06 DIAGNOSIS — E7800 Pure hypercholesterolemia, unspecified: Secondary | ICD-10-CM | POA: Diagnosis present

## 2024-02-06 DIAGNOSIS — K219 Gastro-esophageal reflux disease without esophagitis: Secondary | ICD-10-CM | POA: Diagnosis present

## 2024-02-06 DIAGNOSIS — R410 Disorientation, unspecified: Principal | ICD-10-CM

## 2024-02-06 DIAGNOSIS — H409 Unspecified glaucoma: Secondary | ICD-10-CM | POA: Diagnosis present

## 2024-02-06 DIAGNOSIS — N3281 Overactive bladder: Secondary | ICD-10-CM | POA: Diagnosis present

## 2024-02-06 DIAGNOSIS — Z992 Dependence on renal dialysis: Secondary | ICD-10-CM

## 2024-02-06 DIAGNOSIS — I120 Hypertensive chronic kidney disease with stage 5 chronic kidney disease or end stage renal disease: Secondary | ICD-10-CM | POA: Diagnosis present

## 2024-02-06 DIAGNOSIS — Z79899 Other long term (current) drug therapy: Secondary | ICD-10-CM

## 2024-02-06 DIAGNOSIS — E039 Hypothyroidism, unspecified: Secondary | ICD-10-CM | POA: Diagnosis present

## 2024-02-06 DIAGNOSIS — Z91040 Latex allergy status: Secondary | ICD-10-CM

## 2024-02-06 DIAGNOSIS — Z9842 Cataract extraction status, left eye: Secondary | ICD-10-CM

## 2024-02-06 HISTORY — DX: End stage renal disease: Z99.2

## 2024-02-06 HISTORY — DX: Dependence on renal dialysis: N18.6

## 2024-02-06 LAB — CBG MONITORING, ED: Glucose-Capillary: 193 mg/dL — ABNORMAL HIGH (ref 70–99)

## 2024-02-06 MED ORDER — LORAZEPAM 2 MG/ML IJ SOLN
1.0000 mg | Freq: Once | INTRAMUSCULAR | Status: AC
  Administered 2024-02-06: 1 mg via INTRAMUSCULAR
  Filled 2024-02-06: qty 1

## 2024-02-06 NOTE — ED Triage Notes (Signed)
 Pt bib family d/t AMS. Son stated Friday pt c/o headache after HD and today he noticed her speech seem sluggish. He states when asking, "What is your name?" She responded, "Good Morning." Pt u/t dob or respond to questions appropriately. Pt baseline is Aox4, left sided deficits from previous CVA, and with it. Pt currently Aox1 (self/dob). Son says he has just noticed his mother changing. She is up throughout the night. Agitated at HD. Pt left hand is swollen which is normal but family feels the swelling isn't improving any more after HD tx. Pt HD MWF.

## 2024-02-06 NOTE — ED Provider Triage Note (Signed)
 Emergency Medicine Provider Triage Evaluation Note  Kirsten Mcmahon , a 78 y.o. female  was evaluated in triage.  Pt accompanied by family reporting she has acting differently and more agitated over the past few days. No fall. Notes she has had an intermittent headache.   Review of Systems  Positive:  Negative:   Physical Exam  BP (!) 148/69   Pulse 84   Temp 97.6 F (36.4 C)   Resp 17   SpO2 99%  Gen:   Awake, no distress, agitated refusing exam Resp:  Normal effort  MSK:   Moves extremities at baseline, Left sided paralysis Other:    Medical Decision Making  Medically screening exam initiated at 3:33 PM.  Appropriate orders placed.  Kirsten Mcmahon was informed that the remainder of the evaluation will be completed by another provider, this initial triage assessment does not replace that evaluation, and the importance of remaining in the ED until their evaluation is complete.     Halford Decamp, PA-C 02/06/24 1534

## 2024-02-06 NOTE — ED Provider Notes (Addendum)
 [place] EMERGENCY DEPARTMENT AT [facility] Provider Note   CSN: [id number] Arrival date & time: 02/06/24  1313     History  Chief Complaint  Patient presents with   Altered Mental Status    Kirsten Mcmahon is a 78 y.o. female.  HPI   78 year old female, ESRD on HD every MWF presents the emergency department brought by son with concern for acute change in mental state.  Majority of the history obtained from the son who is her primary caregiver.  He says that since last week she has been significantly agitated, confused, at times slurred speech.  This is a change from her baseline which is typically alert and oriented x 4 and conversational/cooperative.  He does not admit to any acute symptoms including fever, cough, vomiting, diarrhea.  Patient herself is intermittently oriented to herself but otherwise is agitated and refusing any history/physical exam or evaluation.  Home Medications Prior to Admission medications   Medication Sig Start Date End Date Taking? Authorizing Provider  anastrozole (ARIMIDEX) 1 MG tablet Take 1 tablet (1 mg total) by mouth daily. 01/05/24   Serena Croissant, MD  aspirin EC 81 MG tablet Take 81 mg by mouth daily. Swallow whole. Patient not taking: Reported on 12/10/2023    [provider]  calcitRIOL (ROCALTROL) 0.5 MCG capsule Take 1 capsule (0.5 mcg total) by mouth every Monday, Wednesday, and Friday with hemodialysis. 07/31/21   Rolly Salter, MD  diazePAM, 15 MG Dose, (VALTOCO 15 MG DOSE) 2 x 7.5 MG/0.1ML LQPK Place 15 mg into the nose once as needed for up to 1 dose (for seizure lasting more than 2 minutes.). Patient not taking: Reported on 12/10/2023 06/04/23   Rolly Salter, MD  docusate sodium (COLACE) 100 MG capsule Take 1 capsule (100 mg total) by mouth 2 (two) times daily. Patient not taking: Reported on 12/10/2023 06/06/23   Rolly Salter, MD  donepezil (ARICEPT) 10 MG tablet Take 10 mg by mouth at bedtime. 06/28/22   [provider]  lacosamide 150 MG TABS Take 1 tablet (150 mg total) by mouth 2 (two) times daily. Patient taking differently: Take 150 mg by mouth See admin instructions. Take 150 mg by mouth on Sun/Tues/Thurs night(s)- before dialysis to prevent seizures (during dialysis) 06/04/23   Rolly Salter, MD  latanoprost (XALATAN) 0.005 % ophthalmic solution Place 1 drop into both eyes at bedtime. 09/07/18   Tommie Sams, DO  levothyroxine (SYNTHROID, LEVOTHROID) 112 MCG tablet Take 1 tablet (112 mcg total) by mouth daily before breakfast. 09/07/18   Tommie Sams, DO  Methoxy PEG-Epoetin Beta (MIRCERA IJ) See admin instructions. 2 times a month at dialysis 08/10/23 08/08/24  [provider]  metoprolol succinate (TOPROL-XL) 25 MG 24 hr tablet Take 1 tablet (25 mg total) by mouth daily. Patient not taking: Reported on 12/10/2023 06/04/23   Rolly Salter, MD  oxyCODONE (ROXICODONE) 5 MG immediate release tablet Take 1 tablet (5 mg total) by mouth every 6 (six) hours as needed for severe pain (pain score 7-10). Patient not taking: Reported on 12/10/2023 12/08/23   Chevis Pretty III, MD  sucroferric oxyhydroxide (VELPHORO) 500 MG chewable tablet Chew 500 mg by mouth 3 (three) times daily with meals. 10/13/21   [provider]      Allergies    Contrast media [iodinated contrast media], Latex, Shellfish-derived products, and Levemir [insulin detemir]    Review of Systems   Review of Systems  Unable to  perform ROS: Mental status change    Physical Exam Updated Vital Signs BP (!) 148/69   Pulse 87   Temp 97.6 F (36.4 C)   Resp 17   SpO2 100%  Physical Exam Vitals and nursing note reviewed.  Constitutional:      Appearance: Normal appearance.  HENT:     Head: Normocephalic.     Mouth/Throat:     Mouth: Mucous membranes are moist.  Eyes:     Pupils: Pupils are equal, round, and reactive to light.  Cardiovascular:     Rate and Rhythm: Normal rate.  Pulmonary:     Effort: Pulmonary  effort is normal. No respiratory distress.  Abdominal:     Palpations: Abdomen is soft.     Tenderness: There is no abdominal tenderness. There is no guarding.  Musculoskeletal:     Cervical back: No rigidity.  Skin:    General: Skin is warm.  Neurological:     Mental Status: She is alert and oriented to person, place, and time. Mental status is at baseline.  Psychiatric:        Mood and Affect: Mood normal.     ED Results / Procedures / Treatments   Labs (all labs ordered are listed, but only abnormal results are displayed) Labs Reviewed  CBG MONITORING, ED - Abnormal; Notable for the following components:      Result Value   Glucose-Capillary 193 (*)    All other components within normal limits  COMPREHENSIVE METABOLIC PANEL  CBC  SALICYLATE LEVEL  ACETAMINOPHEN LEVEL  URINALYSIS, ROUTINE W REFLEX MICROSCOPIC  ETHANOL  RAPID URINE DRUG SCREEN, HOSP PERFORMED  AMMONIA    EKG None  Radiology CT Head Wo Contrast Result Date: 02/06/2024 CLINICAL DATA:  Altered mental status EXAM: CT HEAD WITHOUT CONTRAST TECHNIQUE: Contiguous axial images were obtained from the base of the skull through the vertex without intravenous contrast. RADIATION DOSE REDUCTION: This exam was performed according to the departmental dose-optimization program which includes automated exposure control, adjustment of the mA and/or kV according to patient size and/or use of iterative reconstruction technique. COMPARISON:  12/10/2023 FINDINGS: Brain: No evidence of acute infarction, hemorrhage, hydrocephalus, extra-axial collection or mass lesion/mass effect. Chronic atrophic and ischemic changes are noted. Diffuse encephalomalacia is noted in distribution of the right middle cerebral artery and right posterior cerebral artery consistent with prior infarct. Prior craniectomy on the right is noted stable from the prior exam. No new focal abnormality is noted. Vascular: No hyperdense vessel or unexpected  calcification. Skull: Craniectomy defect stable in appearance. Sinuses/Orbits: No acute finding. Other: None. IMPRESSION: Chronic ischemic and atrophic changes similar to that seen on the prior exam. No acute abnormality noted. Electronically Signed   By: Alcide Clever M.D.   On: 02/06/2024 20:21   DG Humerus Left Result Date: 02/06/2024 CLINICAL DATA:  Left arm pain EXAM: LEFT HUMERUS - 2+ VIEW COMPARISON:  None Available. FINDINGS: Prior left midshaft fracture is noted with healing. Mild deformity related to the healing is seen. No acute fracture is noted. No soft tissue abnormality is seen. IMPRESSION: Old healed midshaft humeral fracture. Electronically Signed   By: Alcide Clever M.D.   On: 02/06/2024 20:19   DG Chest 2 View Result Date: 02/06/2024 CLINICAL DATA:  Altered mental status. EXAM: CHEST - 2 VIEW COMPARISON:  Chest radiograph 05/30/2023, 12/27/2022 FINDINGS: Cardiac silhouette is again moderately enlarged. Mediastinal contours are unchanged with unchanged tortuosity of the thoracic aorta. Surgical clips again overlie the inferior right neck  likely status post right thyroidectomy and prior as seen on prior CT. Resolution of the prior interstitial thickening seen on most recent 05/30/2023 radiograph. No definite focal airspace opacity to indicate pneumonia. Lateral view is limited by low lung volumes and overlying arm. No definite pleural effusion. No pneumothorax. Surgical clips overlie the right chest wall seen within the right breast on prior CT. Additional new surgical clips within the right axilla. IMPRESSION: 1. Resolution of the prior interstitial thickening seen on most recent 05/30/2023 radiograph. No definite focal airspace opacity to indicate pneumonia. 2. Unchanged moderate cardiomegaly. Electronically Signed   By: Neita Garnet M.D.   On: 02/06/2024 20:18    Procedures Procedures    Medications Ordered in ED Medications  LORazepam (ATIVAN) injection 1 mg (has no administration in  time range)    ED Course/ Medical Decision Making/ A&P                                 Medical Decision Making Amount and/or Complexity of Data Reviewed Labs: ordered. Radiology: ordered.  Risk Prescription drug management.   78 year old female presents emergency department with change in mental status, ongoing for at least the last 4 to 5 days.  Main history giver is her son, Kirsten Mcmahon who is her primary caregiver.  Vitals are normal and stable on arrival.  She is oriented to herself but otherwise agitated and not able to participate.  CT of the head looks stable for the patient. XR imaging shows old humerus fracture and other stable findings.   Concern for acute medical condition that requires lab evaluation, especially in the setting of missed dialysis and possible electrolyte changes. We are pending metabolic workup and urine however the patient is agitated and refusing.  The son is currently not at bedside.  I got in contact with him to get his permission for possible IM sedation for further evaluation and he agrees.  During the process of giving IM medicine, patient is now complaining of left shoulder and upper arm pain. Further XR ordered. Plan to obtain blood work and UA once calm.  Patient signed out pending evaluation.         Final Clinical Impression(s) / ED Diagnoses Final diagnoses:  None    Rx / DC Orders ED Discharge Orders     None         Rozelle Logan, DO 02/06/24 2235    Rozelle Logan, DO 02/06/24 2329

## 2024-02-06 NOTE — ED Notes (Signed)
 Nt attempted to get vitals, pt became increasingly agitated as bp cuff tightened and told nt to stop vitals. Pt then refused vitals when asked again

## 2024-02-06 NOTE — ED Notes (Addendum)
 Pt. Confused;not willing to have blood drawn.

## 2024-02-06 NOTE — ED Notes (Signed)
 Unable to obtain pt's blood pressure, pt refused.   KM

## 2024-02-06 NOTE — ED Notes (Signed)
 Patient refused to allow RN to draw labs or start IV. Provider aware and will discuss plan of care with family.

## 2024-02-06 NOTE — ED Notes (Signed)
 Please call son first because patient lives with him  Kirsten Mcmahon [phone]

## 2024-02-06 NOTE — ED Notes (Signed)
Unable to complete EKG at this time due to pt agitation.

## 2024-02-07 ENCOUNTER — Inpatient Hospital Stay (HOSPITAL_COMMUNITY)

## 2024-02-07 DIAGNOSIS — Z91040 Latex allergy status: Secondary | ICD-10-CM | POA: Diagnosis not present

## 2024-02-07 DIAGNOSIS — E1122 Type 2 diabetes mellitus with diabetic chronic kidney disease: Secondary | ICD-10-CM | POA: Diagnosis present

## 2024-02-07 DIAGNOSIS — N3000 Acute cystitis without hematuria: Secondary | ICD-10-CM

## 2024-02-07 DIAGNOSIS — F0390 Unspecified dementia without behavioral disturbance: Secondary | ICD-10-CM | POA: Diagnosis present

## 2024-02-07 DIAGNOSIS — I120 Hypertensive chronic kidney disease with stage 5 chronic kidney disease or end stage renal disease: Secondary | ICD-10-CM | POA: Diagnosis present

## 2024-02-07 DIAGNOSIS — N186 End stage renal disease: Secondary | ICD-10-CM | POA: Diagnosis present

## 2024-02-07 DIAGNOSIS — E7800 Pure hypercholesterolemia, unspecified: Secondary | ICD-10-CM | POA: Diagnosis present

## 2024-02-07 DIAGNOSIS — Z7984 Long term (current) use of oral hypoglycemic drugs: Secondary | ICD-10-CM | POA: Diagnosis not present

## 2024-02-07 DIAGNOSIS — Z888 Allergy status to other drugs, medicaments and biological substances status: Secondary | ICD-10-CM | POA: Diagnosis not present

## 2024-02-07 DIAGNOSIS — S42492A Other displaced fracture of lower end of left humerus, initial encounter for closed fracture: Secondary | ICD-10-CM | POA: Diagnosis not present

## 2024-02-07 DIAGNOSIS — S42402A Unspecified fracture of lower end of left humerus, initial encounter for closed fracture: Secondary | ICD-10-CM

## 2024-02-07 DIAGNOSIS — E039 Hypothyroidism, unspecified: Secondary | ICD-10-CM | POA: Diagnosis present

## 2024-02-07 DIAGNOSIS — Z992 Dependence on renal dialysis: Secondary | ICD-10-CM | POA: Diagnosis not present

## 2024-02-07 DIAGNOSIS — Z91013 Allergy to seafood: Secondary | ICD-10-CM | POA: Diagnosis not present

## 2024-02-07 DIAGNOSIS — G9340 Encephalopathy, unspecified: Secondary | ICD-10-CM

## 2024-02-07 DIAGNOSIS — R4182 Altered mental status, unspecified: Secondary | ICD-10-CM | POA: Diagnosis not present

## 2024-02-07 DIAGNOSIS — Z79811 Long term (current) use of aromatase inhibitors: Secondary | ICD-10-CM | POA: Diagnosis not present

## 2024-02-07 DIAGNOSIS — M80022A Age-related osteoporosis with current pathological fracture, left humerus, initial encounter for fracture: Secondary | ICD-10-CM | POA: Diagnosis present

## 2024-02-07 DIAGNOSIS — G40909 Epilepsy, unspecified, not intractable, without status epilepticus: Secondary | ICD-10-CM | POA: Diagnosis present

## 2024-02-07 DIAGNOSIS — G9341 Metabolic encephalopathy: Secondary | ICD-10-CM | POA: Diagnosis present

## 2024-02-07 DIAGNOSIS — Z853 Personal history of malignant neoplasm of breast: Secondary | ICD-10-CM | POA: Diagnosis not present

## 2024-02-07 DIAGNOSIS — R569 Unspecified convulsions: Secondary | ICD-10-CM | POA: Diagnosis not present

## 2024-02-07 DIAGNOSIS — N390 Urinary tract infection, site not specified: Secondary | ICD-10-CM | POA: Diagnosis present

## 2024-02-07 DIAGNOSIS — N2581 Secondary hyperparathyroidism of renal origin: Secondary | ICD-10-CM | POA: Diagnosis present

## 2024-02-07 DIAGNOSIS — D631 Anemia in chronic kidney disease: Secondary | ICD-10-CM | POA: Diagnosis present

## 2024-02-07 DIAGNOSIS — D62 Acute posthemorrhagic anemia: Secondary | ICD-10-CM

## 2024-02-07 DIAGNOSIS — R410 Disorientation, unspecified: Secondary | ICD-10-CM | POA: Diagnosis not present

## 2024-02-07 DIAGNOSIS — Z91041 Radiographic dye allergy status: Secondary | ICD-10-CM | POA: Diagnosis not present

## 2024-02-07 DIAGNOSIS — Z9011 Acquired absence of right breast and nipple: Secondary | ICD-10-CM | POA: Diagnosis not present

## 2024-02-07 DIAGNOSIS — B9620 Unspecified Escherichia coli [E. coli] as the cause of diseases classified elsewhere: Secondary | ICD-10-CM | POA: Diagnosis present

## 2024-02-07 DIAGNOSIS — Z1732 Human epidermal growth factor receptor 2 negative status: Secondary | ICD-10-CM | POA: Diagnosis not present

## 2024-02-07 LAB — URINALYSIS, ROUTINE W REFLEX MICROSCOPIC
Bilirubin Urine: NEGATIVE
Glucose, UA: NEGATIVE mg/dL
Ketones, ur: NEGATIVE mg/dL
Nitrite: NEGATIVE
Protein, ur: 100 mg/dL — AB
RBC / HPF: >50 RBC/hpf (ref 0–5)
Specific Gravity, Urine: 1.011 (ref 1.005–1.030)
WBC, UA: >50 WBC/hpf (ref 0–5)
pH: 8 (ref 5.0–8.0)

## 2024-02-07 LAB — CBC
HCT: 22.1 % — ABNORMAL LOW (ref 36.0–46.0)
Hemoglobin: 7.2 g/dL — ABNORMAL LOW (ref 12.0–15.0)
MCH: 33.2 pg (ref 26.0–34.0)
MCHC: 32.6 g/dL (ref 30.0–36.0)
MCV: 101.8 fL — ABNORMAL HIGH (ref 80.0–100.0)
Platelets: 129 10*3/uL — ABNORMAL LOW (ref 150–400)
RBC: 2.17 MIL/uL — ABNORMAL LOW (ref 3.87–5.11)
RDW: 13.1 % (ref 11.5–15.5)
WBC: 10.4 10*3/uL (ref 4.0–10.5)
nRBC: 0 % (ref 0.0–0.2)

## 2024-02-07 LAB — RAPID URINE DRUG SCREEN, HOSP PERFORMED
Amphetamines: NOT DETECTED
Barbiturates: NOT DETECTED
Benzodiazepines: NOT DETECTED
Cocaine: NOT DETECTED
Opiates: NOT DETECTED
Tetrahydrocannabinol: NOT DETECTED

## 2024-02-07 LAB — COMPREHENSIVE METABOLIC PANEL
ALT: 22 U/L (ref 0–44)
AST: 50 U/L — ABNORMAL HIGH (ref 15–41)
Albumin: 3.1 g/dL — ABNORMAL LOW (ref 3.5–5.0)
Alkaline Phosphatase: 49 U/L (ref 38–126)
Anion gap: 14 (ref 5–15)
BUN: 45 mg/dL — ABNORMAL HIGH (ref 8–23)
CO2: 30 mmol/L (ref 22–32)
Calcium: 11.1 mg/dL — ABNORMAL HIGH (ref 8.9–10.3)
Chloride: 98 mmol/L (ref 98–111)
Creatinine, Ser: 9.22 mg/dL — ABNORMAL HIGH (ref 0.44–1.00)
GFR, Estimated: 4 mL/min — ABNORMAL LOW (ref >60)
Glucose, Bld: 136 mg/dL — ABNORMAL HIGH (ref 70–99)
Potassium: 5.3 mmol/L — ABNORMAL HIGH (ref 3.5–5.1)
Sodium: 142 mmol/L (ref 135–145)
Total Bilirubin: 1.4 mg/dL — ABNORMAL HIGH (ref 0.0–1.2)
Total Protein: 6.5 g/dL (ref 6.5–8.1)

## 2024-02-07 LAB — AMMONIA: Ammonia: 32 umol/L (ref 9–35)

## 2024-02-07 LAB — PREPARE RBC (CROSSMATCH)

## 2024-02-07 LAB — ACETAMINOPHEN LEVEL: Acetaminophen (Tylenol), Serum: <10 ug/mL — ABNORMAL LOW (ref 10–30)

## 2024-02-07 LAB — ETHANOL: Alcohol, Ethyl (B): <10 mg/dL (ref <10)

## 2024-02-07 LAB — SALICYLATE LEVEL: Salicylate Lvl: <7 mg/dL — ABNORMAL LOW (ref 7.0–30.0)

## 2024-02-07 MED ORDER — CALCITRIOL 0.5 MCG PO CAPS
0.5000 ug | ORAL_CAPSULE | ORAL | Status: DC
Start: 2024-02-08 — End: 2024-02-07

## 2024-02-07 MED ORDER — CEFTRIAXONE SODIUM 1 G IJ SOLR
1.0000 g | Freq: Once | INTRAMUSCULAR | Status: AC
  Administered 2024-02-07: 1 g via INTRAVENOUS
  Filled 2024-02-07: qty 10

## 2024-02-07 MED ORDER — HALOPERIDOL LACTATE 5 MG/ML IJ SOLN
5.0000 mg | Freq: Once | INTRAMUSCULAR | Status: AC
  Administered 2024-02-07: 5 mg via INTRAMUSCULAR
  Filled 2024-02-07: qty 1

## 2024-02-07 MED ORDER — ASPIRIN 81 MG PO TBEC
81.0000 mg | DELAYED_RELEASE_TABLET | Freq: Every day | ORAL | Status: DC
  Administered 2024-02-07 – 2024-02-14 (×7): 81 mg via ORAL
  Filled 2024-02-07 (×7): qty 1

## 2024-02-07 MED ORDER — LEVOTHYROXINE SODIUM 112 MCG PO TABS
112.0000 ug | ORAL_TABLET | Freq: Every day | ORAL | Status: DC
  Administered 2024-02-08 – 2024-02-14 (×4): 112 ug via ORAL
  Filled 2024-02-07 (×8): qty 1

## 2024-02-07 MED ORDER — HEPARIN SODIUM (PORCINE) 5000 UNIT/ML IJ SOLN
5000.0000 [IU] | Freq: Three times a day (TID) | INTRAMUSCULAR | Status: DC
  Administered 2024-02-07 – 2024-02-14 (×18): 5000 [IU] via SUBCUTANEOUS
  Filled 2024-02-07 (×20): qty 1

## 2024-02-07 MED ORDER — HYDROMORPHONE HCL 1 MG/ML IJ SOLN
1.0000 mg | Freq: Once | INTRAMUSCULAR | Status: AC
  Administered 2024-02-07: 1 mg via INTRAVENOUS
  Filled 2024-02-07: qty 1

## 2024-02-07 MED ORDER — ORAL CARE MOUTH RINSE
15.0000 mL | OROMUCOSAL | Status: DC
  Administered 2024-02-07 – 2024-02-10 (×15): 15 mL via OROMUCOSAL

## 2024-02-07 MED ORDER — LACOSAMIDE 50 MG PO TABS
150.0000 mg | ORAL_TABLET | Freq: Two times a day (BID) | ORAL | Status: DC
  Administered 2024-02-07 – 2024-02-14 (×10): 150 mg via ORAL
  Filled 2024-02-07 (×14): qty 3

## 2024-02-07 MED ORDER — SODIUM CHLORIDE 0.9 % IV SOLN
1.0000 g | INTRAVENOUS | Status: AC
  Administered 2024-02-08 – 2024-02-11 (×4): 1 g via INTRAVENOUS
  Filled 2024-02-07 (×4): qty 10

## 2024-02-07 MED ORDER — ACETAMINOPHEN 650 MG RE SUPP: 650.0000 mg | Freq: Four times a day (QID) | RECTAL | Status: DC | PRN

## 2024-02-07 MED ORDER — ANASTROZOLE 1 MG PO TABS
1.0000 mg | ORAL_TABLET | Freq: Every day | ORAL | Status: DC
  Administered 2024-02-07 – 2024-02-14 (×7): 1 mg via ORAL
  Filled 2024-02-07 (×8): qty 1

## 2024-02-07 MED ORDER — ORAL CARE MOUTH RINSE: 15.0000 mL | OROMUCOSAL | Status: DC | PRN

## 2024-02-07 MED ORDER — ALBUTEROL SULFATE (2.5 MG/3ML) 0.083% IN NEBU: 2.5000 mg | INHALATION_SOLUTION | RESPIRATORY_TRACT | Status: DC | PRN

## 2024-02-07 MED ORDER — SODIUM CHLORIDE 0.9% IV SOLUTION: Freq: Once | INTRAVENOUS | Status: AC

## 2024-02-07 MED ORDER — DONEPEZIL HCL 10 MG PO TABS
10.0000 mg | ORAL_TABLET | Freq: Every day | ORAL | Status: DC
  Administered 2024-02-08 – 2024-02-11 (×2): 10 mg via ORAL
  Filled 2024-02-07 (×4): qty 1

## 2024-02-07 MED ORDER — DOCUSATE SODIUM 100 MG PO CAPS
100.0000 mg | ORAL_CAPSULE | Freq: Two times a day (BID) | ORAL | Status: DC
  Administered 2024-02-07 – 2024-02-14 (×8): 100 mg via ORAL
  Filled 2024-02-07 (×13): qty 1

## 2024-02-07 MED ORDER — CHLORHEXIDINE GLUCONATE CLOTH 2 % EX PADS
6.0000 | MEDICATED_PAD | Freq: Every day | CUTANEOUS | Status: DC
Start: 2024-02-08 — End: 2024-02-14
  Administered 2024-02-08 – 2024-02-14 (×7): 6 via TOPICAL

## 2024-02-07 MED ORDER — ACETAMINOPHEN 325 MG PO TABS
650.0000 mg | ORAL_TABLET | Freq: Four times a day (QID) | ORAL | Status: DC | PRN
  Administered 2024-02-07 – 2024-02-13 (×5): 650 mg via ORAL
  Filled 2024-02-07 (×6): qty 2

## 2024-02-07 NOTE — ED Notes (Signed)
 Pt unable to complete neuro check. Pt was medicated prior shift and is still lethargic. Respirations even and non labored. NAD NARD.

## 2024-02-07 NOTE — H&P (Signed)
 History and Physical    Patient: Kirsten Mcmahon XBJ:[id number] DOB: [birth year] DOA: 02/06/2024 DOS: the patient was seen and examined on 02/07/2024 PCP: [facility]  Patient coming from:  Home with  Chief Complaint:  Chief Complaint  Patient presents with   Altered Mental Status   HPI:  Unable to obtain any appropriate history from patient at this time.  Patient apparently received Haldol and benzodiazepine in the ED due due to agitation.  She is extremely somnolent at this time and is unable to provide any history.  She is protecting airways appropriately.  Kirsten Mcmahon is a 78 y.o. female with medical history significant of end-stage renal disease on hemodialysis MWF, history of stroke in 1998 with residual left-sided weakness, dementia, presented history of seizure disorder, chronic anemia, hypertension, hypothyroidism, non-insulin-dependent diabetes mellitus and history of breast cancer (ER/PR/HER2-)status post radical mastectomy in 2024, on adjuvant therapy with anastrozole close is not a primary will remove and I believe for failure to progress his diet.Marland Kitchen  She was brought in earlier in the day by son who is not bedside at this time. patient was brought in on account of altered mental status from baseline.  Patient was reported to be agitated on arrival. Whilst patient was being moved in the ED, nurses report a pop in her left shoulder.  X-ray was has done and revealed an acute fracture in the left humerus.   Workup thus far concerning for UTI.  Patient also has underlying seizure disorder.  No witnessed tonic-clonic seizure-like activity.  Head CT scan were negative for any acute ventricular abnormality.  Blood workup also revealed drop in hemoglobin.  Digital rectal examination done in the ED showed no bleeding. Review of Systems: unable to review all systems due to the inability of the patient to answer questions. Past Medical History:  Diagnosis Date   Anemia     Cancer (HCC) 2017   Right breast   Chronic kidney disease    progression to ESRD 04/10/21   GERD (gastroesophageal reflux disease)    Glaucoma    Hemiparesis (HCC)    left side   High cholesterol    History of seizure    after a spider bite; 07/15/21   History of stroke with residual deficit    left-side weakness   Hypertension    states BP under control with meds., has been on med. x 2 yr.   Hypothyroidism    Non-insulin dependent type 2 diabetes mellitus (HCC)    Overactive bladder    PFO (patent foramen ovale) 05/15/2021   Stroke (HCC)    1998 weakness on left side; 05/12/21   Past Surgical History:  Procedure Laterality Date   ABDOMINAL HYSTERECTOMY     complete   AV FISTULA PLACEMENT Right 09/29/2021   Procedure: INSERTION OF RIGHT ARM ARTERIOVENOUS (AV) GORE-TEX GRAFT;  Surgeon: Chuck Hint, MD;  Location: [facility] OR;  Service: Vascular;  Laterality: Right;   BREAST BIOPSY Left 08/03/2018   Benign adipose tissue   BREAST BIOPSY Right 02/15/2023   [place] RT BREAST BX W LOC DEV 1ST LESION IMG BX SPEC US GUIDE 02/15/2023 [facility]   BREAST BIOPSY Left 02/15/2023   [place] LT BREAST BX W LOC DEV 1ST LESION IMG BX SPEC US GUIDE 02/15/2023 [facility]   BREAST EXCISIONAL BIOPSY Right 2014   Positive   BREAST LUMPECTOMY Right    BUBBLE STUDY  05/15/2021   Procedure: BUBBLE STUDY;  Surgeon: Jake Bathe, MD;  Location: [facility] ENDOSCOPY;  Service: Cardiovascular;;   CATARACT EXTRACTION W/ INTRAOCULAR LENS IMPLANT Left    CEREBRAL ANEURYSM REPAIR  1998   DIALYSIS/PERMA CATHETER INSERTION N/A 04/10/2021   Procedure: DIALYSIS/PERMA CATHETER INSERTION;  Surgeon: Annice Needy, MD;  Location: [facility] INVASIVE CV LAB;  Service: Cardiovascular;  Laterality: N/A;   IR FLUORO GUIDE CV LINE RIGHT  04/30/2021   MASTECTOMY MODIFIED RADICAL Right 12/08/2023   Procedure: RIGHT MODIFIED RADICAL MASTECTOMY;  Surgeon: Griselda Miner, MD;  Location: [facility] ORS;  Service: General;  Laterality: Right;    PICC LINE INSERTION     TEE WITHOUT CARDIOVERSION N/A 05/15/2021   Procedure: TRANSESOPHAGEAL ECHOCARDIOGRAM (TEE);  Surgeon: Jake Bathe, MD;  Location[facility] ENDOSCOPY;  Service: Cardiovascular;  Laterality: N/A;   THYROID LOBECTOMY Right 07/15/2017   Procedure: RIGHT THYROID LOBECTOMY;  Surgeon: Darnell Level, MD;  Location: [facility] OR;  Service: General;  Laterality: Right;   Social History:  reports that she has never smoked. She has never used smokeless tobacco. She reports that she does not drink alcohol and does not use drugs.  Allergies  Allergen Reactions   Contrast Media [Iodinated Contrast Media] Swelling   Latex Itching and Other (See Comments)    Unconfirmed    Shellfish-Derived Products Other (See Comments)    Unconfirmed by family   Levemir [Insulin Detemir] Itching    Family History  Problem Relation Age of Onset   Cancer Brother        possible prostate cancer per her daughter   Breast cancer Neg Hx     Prior to Admission medications   Medication Sig Start Date End Date Taking? Authorizing Provider  anastrozole (ARIMIDEX) 1 MG tablet Take 1 tablet (1 mg total) by mouth daily. 01/05/24   Serena Croissant, MD  aspirin EC 81 MG tablet Take 81 mg by mouth daily. Swallow whole. Patient not taking: Reported on 12/10/2023    [provider]  calcitRIOL (ROCALTROL) 0.5 MCG capsule Take 1 capsule (0.5 mcg total) by mouth every Monday, Wednesday, and Friday with hemodialysis. 07/31/21   Rolly Salter, MD  diazePAM, 15 MG Dose, (VALTOCO 15 MG DOSE) 2 x 7.5 MG/0.1ML LQPK Place 15 mg into the nose once as needed for up to 1 dose (for seizure lasting more than 2 minutes.). Patient not taking: Reported on 12/10/2023 06/04/23   Rolly Salter, MD  docusate sodium (COLACE) 100 MG capsule Take 1 capsule (100 mg total) by mouth 2 (two) times daily. Patient not taking: Reported on 12/10/2023 06/06/23   Rolly Salter, MD  donepezil (ARICEPT) 10 MG tablet Take 10 mg by mouth at bedtime.  06/28/22   [provider]  lacosamide 150 MG TABS Take 1 tablet (150 mg total) by mouth 2 (two) times daily. Patient taking differently: Take 150 mg by mouth See admin instructions. Take 150 mg by mouth on Sun/Tues/Thurs night(s)- before dialysis to prevent seizures (during dialysis) 06/04/23   Rolly Salter, MD  latanoprost (XALATAN) 0.005 % ophthalmic solution Place 1 drop into both eyes at bedtime. 09/07/18   Tommie Sams, DO  levothyroxine (SYNTHROID, LEVOTHROID) 112 MCG tablet Take 1 tablet (112 mcg total) by mouth daily before breakfast. 09/07/18   Tommie Sams, DO  Methoxy PEG-Epoetin Beta (MIRCERA IJ) See admin instructions. 2 times a month at dialysis 08/10/23 08/08/24  [provider]  metoprolol succinate (TOPROL-XL) 25 MG 24 hr tablet Take 1 tablet (25 mg total) by mouth daily. Patient not taking:  Reported on 12/10/2023 06/04/23   Rolly Salter, MD  oxyCODONE (ROXICODONE) 5 MG immediate release tablet Take 1 tablet (5 mg total) by mouth every 6 (six) hours as needed for severe pain (pain score 7-10). Patient not taking: Reported on 12/10/2023 12/08/23   Chevis Pretty III, MD  sucroferric oxyhydroxide (VELPHORO) 500 MG chewable tablet Chew 500 mg by mouth 3 (three) times daily with meals. 10/13/21   [provider]    Physical Exam: Vitals:   02/06/24 2103 02/07/24 0036 02/07/24 0438 02/07/24 0443  BP:  (!) 149/85  133/69  Pulse: 87 97  87  Resp:  (!) 22  20  Temp:  97.8 F (36.6 C) (!) 97.2 F (36.2 C)   TempSrc:  Oral Temporal   SpO2: 100% 97%  94%    Data Reviewed: Sodium is 142, potassium 4.3, chloride 98, bicarb 36, BUN 45 creatinine 9.22, calcium 11.1, AST 50, ALT 22, total bilirubin 1.4, hemoglobin 7.2, hematocrit 22, MCV 101, platelets 129 acetaminophen level less than 10, glucose 136 Urinalysis concerning for acute UTI with many bacteria, WBC greater than 50 Urine toxicology-not detected CT head shows chronic ischemic and atrophic changes.  No acute  abnormality  Xray shows Acute displaced distal humerus shaft fracture slightly caudal to a chronic humerus fracture deformity  Assessment and Plan: 78 year old female with dementia, end-stage renal disease, CVA with left residual weakness who presents from home on account of altered mental status from baseline.  Patient had missed her last hemodialysis session due to her condition.  She is being treated for presumed urinary tract infection.  Other possibilities include seizure disorder versus acute delirium in the context of advanced dementia.  Acute encephalopathy: Metabolic etiology likely from UTI.  Other differentials include postictal state post seizures.  Duration of her altered mental status less likely consistent with acute seizures.  CT scan shows no acute abnormality.  Dementia: With increased delirium and behavioral abnormality.  Agitation protocol was instituted in the ED.  Medications were offered.  Patient currently sleeping.  History of seizure disorder: Patient is on Vimpat on days of dialysis.  Lacosamide levels could not be ordered.  Resume home medication.  Seizure precautions in place.  IV Ativan as needed for seizures.  End-stage renal disease: Missed her last dialysis session.  She is on dialysis on MWF.  Nephrology has been consulted from the ED.  Patient will be seen for hemodialysis maintenance today.  Urinary tract infection: Patient was empirically initiated on IV antibiotics with Rocephin  Closed left humeral fracture:Acutely displaced distal humerus shaft fracture whilst patient was being cared for by nurses.  Likely pathologic fracture from osteoporosis.  Orthopedics will be consulted to evaluate and advise on possible surgical repair.  Acute on chronic anemia: Patient looks euvolemic at this time.  Will transfuse 1 unit of PRBC.  Baseline hemoglobin of ~10, target hemoglobin greater than 8.  Consider GI evaluation if hemoglobin continues to trend down.  History of  CVA with residual left-sided weakness: CT scan without any acute intracranial moiety.  Chronic ischemic changes were noted. Consider MRI if mentation does not improve  Hypercalcemia: Hold calcitriol.  Will send ionized calcium levels.  Tumor lysis syndrome less likely.  Essential hypertension: Resume home medications of metoprolol.  History of breast cancer status post radical mastectomy.  Currently on adjuvant chemotherapy.  Hypothyroidism:: Continue with Synthroid.   Advance Care Planning:   Code Status: Full Code   Consults: Nephrology, orthopedics  Family Communication: No family at  bedside   Severity of Illness: The appropriate patient status for this patient is INPATIENT. Inpatient status is judged to be reasonable and necessary in order to provide the required intensity of service to ensure the patient's safety. The patient's presenting symptoms, physical exam findings, and initial radiographic and laboratory data in the context of their chronic comorbidities is felt to place them at high risk for further clinical deterioration. Furthermore, it is not anticipated that the patient will be medically stable for discharge from the hospital within 2 midnights of admission.   * I certify that at the point of admission it is my clinical judgment that the patient will require inpatient hospital care spanning beyond 2 midnights from the point of admission due to high intensity of service, high risk for further deterioration and high frequency of surveillance required.*  Author: Lilia Pro, MD 02/07/2024 5:03 AM  For on call review [email or web address].

## 2024-02-07 NOTE — Progress Notes (Signed)
 Orthopedic Tech Progress Note Patient Details:  Kirsten Mcmahon May 22, 1946 [id number]  Night shift let me know that there was an order for a SLING when patient was in [address], he was told to just let patient rest since nursing had finally got patient calmed. I see her arm was broken sometime while receiving care from nursing last night, waiting until ortho gets here to see if they want her to be splinted before applying sling..  Patient ID: Kirsten Mcmahon, female   DOB: 05/13/1946, 78 y.o.   MRN: [id number]  [name] 02/07/2024, 8:47 AM

## 2024-02-07 NOTE — Plan of Care (Signed)
  Problem: Skin Integrity: Goal: Risk for impaired skin integrity will decrease Outcome: Progressing   Problem: Health Behavior/Discharge Planning: Goal: Ability to manage health-related needs will improve Outcome: Not Progressing   Problem: Coping: Goal: Level of anxiety will decrease Outcome: Not Progressing

## 2024-02-07 NOTE — ED Notes (Addendum)
 IV placed to R forearm without tourniquet. After insertion, noted pt had bp cuff over fistula. Dr. Andria Meuse made aware of IV placement and recommends leaving IV at this time; left arm fractured with pt guarding.

## 2024-02-07 NOTE — Consult Note (Addendum)
 Renal Service Consult Note [facility]  Kirsten Mcmahon 02/07/2024 Kirsten Krabbe, MD Requesting Physician: Dr. Frederick Peers  Reason for Consult: ESRD pt w/ altered mental status HPI: The patient is a 78 y.o. year-old w/ PMH as below who presented to ED brought by son. Not acting herself, confused, speech sluggish. Pt on HD MWF, last HD Friday. In ED was confused and a bit agitated. She rec'd IV ativan. Labs suggested UTI. Xray of L arm showed humerus fracture. Pt admitted for confusion and humerus fracture, possible UTI. Hx of dementia. We are asked to see for dialysis.    Pt seen in ED hallway bed. Pt is very poor historian, could not answer questions.    PMH ESRD on HD H/o CVA 1998 w/ L hemiparesis Hx seizure d/o Anemia  Dementia NIDDM Hypothyroidism HTN Hx breast cancer   ROS - n/a   Past Medical History  Past Medical History:  Diagnosis Date   Anemia    Cancer (HCC) 2017   Right breast   Chronic kidney disease    progression to ESRD 04/10/21   GERD (gastroesophageal reflux disease)    Glaucoma    Hemiparesis (HCC)    left side   High cholesterol    History of seizure    after a spider bite; 07/15/21   History of stroke with residual deficit    left-side weakness   Hypertension    states BP under control with meds., has been on med. x 2 yr.   Hypothyroidism    Non-insulin dependent type 2 diabetes mellitus (HCC)    Overactive bladder    PFO (patent foramen ovale) 05/15/2021   Stroke (HCC)    1998 weakness on left side; 05/12/21   Past Surgical History  Past Surgical History:  Procedure Laterality Date   ABDOMINAL HYSTERECTOMY     complete   AV FISTULA PLACEMENT Right 09/29/2021   Procedure: INSERTION OF RIGHT ARM ARTERIOVENOUS (AV) GORE-TEX GRAFT;  Surgeon: Chuck Hint, MD;  Location: [facility] OR;  Service: Vascular;  Laterality: Right;   BREAST BIOPSY Left 08/03/2018   Benign adipose tissue   BREAST BIOPSY Right 02/15/2023   [place] RT  BREAST BX W LOC DEV 1ST LESION IMG BX SPEC US GUIDE 02/15/2023 [facility]   BREAST BIOPSY Left 02/15/2023   [place] LT BREAST BX W LOC DEV 1ST LESION IMG BX SPEC US GUIDE 02/15/2023 [facility]   BREAST EXCISIONAL BIOPSY Right 2014   Positive   BREAST LUMPECTOMY Right    BUBBLE STUDY  05/15/2021   Procedure: BUBBLE STUDY;  Surgeon: Jake Bathe, MD;  Location: [facility] ENDOSCOPY;  Service: Cardiovascular;;   CATARACT EXTRACTION W/ INTRAOCULAR LENS IMPLANT Left    CEREBRAL ANEURYSM REPAIR  1998   DIALYSIS/PERMA CATHETER INSERTION N/A 04/10/2021   Procedure: DIALYSIS/PERMA CATHETER INSERTION;  Surgeon: Annice Needy, MD;  Location: [facility] INVASIVE CV LAB;  Service: Cardiovascular;  Laterality: N/A;   IR FLUORO GUIDE CV LINE RIGHT  04/30/2021   MASTECTOMY MODIFIED RADICAL Right 12/08/2023   Procedure: RIGHT MODIFIED RADICAL MASTECTOMY;  Surgeon: Griselda Miner, MD;  Location: [facility] ORS;  Service: General;  Laterality: Right;   PICC LINE INSERTION     TEE WITHOUT CARDIOVERSION N/A 05/15/2021   Procedure: TRANSESOPHAGEAL ECHOCARDIOGRAM (TEE);  Surgeon: Jake Bathe, MD;  Location: [facility] ENDOSCOPY;  Service: Cardiovascular;  Laterality: N/A;   THYROID LOBECTOMY Right 07/15/2017   Procedure: RIGHT THYROID LOBECTOMY;  Surgeon: Darnell Level, MD;  Location: [facility] OR;  Service: General;  Laterality: Right;   Family History  Family History  Problem Relation Age of Onset   Cancer Brother        possible prostate cancer per her daughter   Breast cancer Neg Hx    Social History  reports that she has never smoked. She has never used smokeless tobacco. She reports that she does not drink alcohol and does not use drugs. Allergies  Allergies  Allergen Reactions   Contrast Media [Iodinated Contrast Media] Swelling   Latex Itching and Other (See Comments)    Unconfirmed    Shellfish-Derived Products Other (See Comments)    Unconfirmed by family   Levemir [Insulin Detemir] Itching   Home medications Prior to  Admission medications   Medication Sig Start Date End Date Taking? Authorizing Provider  anastrozole (ARIMIDEX) 1 MG tablet Take 1 tablet (1 mg total) by mouth daily. 01/05/24   Serena Croissant, MD  aspirin EC 81 MG tablet Take 81 mg by mouth daily. Swallow whole. Patient not taking: Reported on 12/10/2023    [provider]  B Complex-C-Zn-Folic Acid (DIALYVITE 800-ZINC 15) 0.8 MG TABS Take 1 tablet by mouth every evening. 12/12/23   [provider]  calcitRIOL (ROCALTROL) 0.5 MCG capsule Take 1 capsule (0.5 mcg total) by mouth every Monday, Wednesday, and Friday with hemodialysis. 07/31/21   Rolly Salter, MD  diazePAM, 15 MG Dose, (VALTOCO 15 MG DOSE) 2 x 7.5 MG/0.1ML LQPK Place 15 mg into the nose once as needed for up to 1 dose (for seizure lasting more than 2 minutes.). Patient not taking: Reported on 12/10/2023 06/04/23   Rolly Salter, MD  docusate sodium (COLACE) 100 MG capsule Take 1 capsule (100 mg total) by mouth 2 (two) times daily. Patient not taking: Reported on 12/10/2023 06/06/23   Rolly Salter, MD  donepezil (ARICEPT) 10 MG tablet Take 10 mg by mouth at bedtime. 06/28/22   [provider]  FAMOTIDINE ORIG ST 10 MG tablet Take 10 mg by mouth daily. 12/17/23   [provider]  lacosamide 150 MG TABS Take 1 tablet (150 mg total) by mouth 2 (two) times daily. Patient taking differently: Take 150 mg by mouth See admin instructions. Take 150 mg by mouth on Sun/Tues/Thurs night(s)- before dialysis to prevent seizures (during dialysis) 06/04/23   Rolly Salter, MD  latanoprost (XALATAN) 0.005 % ophthalmic solution Place 1 drop into both eyes at bedtime. 09/07/18   Tommie Sams, DO  levothyroxine (SYNTHROID, LEVOTHROID) 112 MCG tablet Take 1 tablet (112 mcg total) by mouth daily before breakfast. 09/07/18   Tommie Sams, DO  Methoxy PEG-Epoetin Beta (MIRCERA IJ) See admin instructions. 2 times a month at dialysis 08/10/23 08/08/24  [provider]   metoprolol succinate (TOPROL-XL) 25 MG 24 hr tablet Take 1 tablet (25 mg total) by mouth daily. Patient not taking: Reported on 12/10/2023 06/04/23   Rolly Salter, MD  oxyCODONE (ROXICODONE) 5 MG immediate release tablet Take 1 tablet (5 mg total) by mouth every 6 (six) hours as needed for severe pain (pain score 7-10). Patient not taking: Reported on 12/10/2023 12/08/23   Chevis Pretty III, MD  sucroferric oxyhydroxide (VELPHORO) 500 MG chewable tablet Chew 500 mg by mouth 3 (three) times daily with meals. 10/13/21   [provider]     Vitals:   02/07/24 0954 02/07/24 1222 02/07/24 1237 02/07/24 1349  BP: (!) 158/69 (!) 170/83 (!) 171/79 (!) 168/83  Pulse: 86 94 89  72  Resp: 18 (!) 29 20 16   Temp:  (!) 97.5 F (36.4 C) (!) 97.5 F (36.4 C) 98.2 F (36.8 C)  TempSrc:  Oral Oral Axillary  SpO2: 96% 100% 100% 100%   Exam Gen lethargic, disheveled elderly AAF, confused On room air, 98% No rash, cyanosis or gangrene Sclera anicteric, throat clear  No jvd or bruits Chest clear bilat to bases, no rales/ wheezing RRR no RG Abd soft ntnd no mass or ascites +bs GU defer MS left upper arm is deformed Ext 1+ LUE edema, no LE edema Neuro is Ox 1, nf, lethargic    RUA AVF+bruit       Renal-related home meds: - metoprolol xl 25 every day - velphoro 500 ac tid     OP HD: MWF G-O  3h  B350   62.5kg  2K bath R AVG   heparin 2000 - last HD 2/28, post HD 62.4kg - missed HD yesterday   BP 170 79, HR 89- 100, RR 16-22, temp 98   RA 100%  CXR 3/30 - no active disease    Na 142  K+ 5.3  CO2 30  BUN 45  creat 9.22    Hb 7.2  WBC 10K    Assessment/ Plan: Acute encephalopathy - pt w/ hx dementia, esrd , hx CVA. In ED rx'd for possible UTI and rec'd sedating meds for agitation. Per pmd UTI - suspected, getting IV abx L humerus fracture - per ortho and pmd ESRD - on HD MWF. Missed HD yesterday. No acute needs -> plan is for HD tomorrow.  HTN - bp's on the high side. Cont home  meds.  Volume - No edema on exam, CXR clear. UF goal pending bed wt.  Anemia of esrd - Hb 7- 8 range, get records. Transfuse for Hb < 7.  Secondary hyperparathyroidism - CCa is high, hold any vdra's.  Dementia  H/o CVA - w/ L sided weakness      Vinson Moselle  MD [facility] 02/07/2024, 2:36 PM  Recent Labs  Lab 02/07/24 0041  HGB 7.2*  ALBUMIN 3.1*  CALCIUM 11.1*  CREATININE 9.22*  K 5.3*   Inpatient medications:  anastrozole  1 mg Oral Daily   aspirin EC  81 mg Oral Daily   docusate sodium  100 mg Oral BID   donepezil  10 mg Oral QHS   heparin  5,000 Units Subcutaneous Q8H   Lacosamide  150 mg Oral See admin instructions   levothyroxine  112 mcg Oral QAC breakfast   mouth rinse  15 mL Mouth Rinse Q2H    [START ON 02/08/2024] cefTRIAXone (ROCEPHIN)  IV     acetaminophen **OR** acetaminophen, albuterol, mouth rinse

## 2024-02-07 NOTE — Consult Note (Signed)
 Orthopedic Surgery Consult Note  Assessment: Patient is a 78 y.o. female with left distal humerus fracture    Plan: -Will discuss with my traumatology colleagues -Placed into a well padded splint this evening -Diet: regular -Weight bearing status: NWB LUE in splint -PT evaluate and treat -Pain control -Dispo: pending discussion with ortho trauma surgeons   ___________________________________________________________________________   Reason for consult left distal humerus fracture   History:  Patient is a 78 y.o. female with PMHx of ESRD on hemodialysis, HTN, stroke with left sided hemiparesis, anemia, breast cancer, DM who presented to the ED on 3/03 with altered mental status. While in the hospital, she was having left arm pain. X-rays showed a left distal humerus fracture. The patent does not recall any injury. She is not having any other extremity. Her son [name] does not recall any trauma to her arm and said her arm was fine prior to presentation to the hospital.   Past Medical History:  Diagnosis Date   Anemia    Cancer (HCC) 2017   Right breast   Chronic kidney disease    progression to ESRD 04/10/21   GERD (gastroesophageal reflux disease)    Glaucoma    Hemiparesis (HCC)    left side   High cholesterol    History of seizure    after a spider bite; 07/15/21   History of stroke with residual deficit    left-side weakness   Hypertension    states BP under control with meds., has been on med. x 2 yr.   Hypothyroidism    Non-insulin dependent type 2 diabetes mellitus (HCC)    Overactive bladder    PFO (patent foramen ovale) 05/15/2021   Stroke (HCC)    1998 weakness on left side; 05/12/21      Physical Exam:  General: no acute distress, appears stated age Neurologic: alert, answering questions appropriately, following commands Cardiovascular: regular rate, no cyanosis Respiratory: unlabored breathing on room air, symmetric chest rise Psychiatric: appropriate  affect, normal cadence to speech  MSK:    -Left upper extremity  TTP over the humerus, no other tenderness to palpation over the extremity  Does not move the extremity - unclear if this is due to pain and fear avoidance or from her hemiparesis after stroke Sensation intact to light touch in m/u/r/a nerve distributions  Palpable radial pulse, hand warm and well perfused  Imaging: XR of the left humerus from 02/06/2023 was independently reviewed and interpreted, showing a distal humerus fracture that is displaced and in valgus alignment. There is a chronic more midshaft humerus deformity from prior fracture.   Patient name: ARLYNE BRANDES Patient MRN: [id number] Date: 02/07/24

## 2024-02-07 NOTE — Progress Notes (Signed)
 PT Cancellation Note  Patient Details Name: Kirsten Mcmahon MRN: [id number] DOB: 04-08-1946   Cancelled Treatment:    Reason Eval/Treat Not Completed: Patient's level of consciousness;Other (comment) (Spoke with nurse. Patient has been lethargic this morning and is awaiting ortho consult for humerus fracture with no sling in the room. Will hold PT and follow up as appropriate when able to participate and after ortho recommendations for fracture.)  Donna Bernard, PT, MPT  Ina Homes 02/07/2024, 1:15 PM

## 2024-02-07 NOTE — Progress Notes (Signed)
 Orthopedic Tech Progress Note Patient Details:  Kirsten Mcmahon 09/26/1946 [id number]  Ortho Devices Type of Ortho Device: Sling immobilizer Ortho Device/Splint Interventions: Ordered     Attempted to apply sling, however this caused patient more discomfort. Sling left at bedside, however still waiting on ortho consult as no splint has been ordered and patients humerus is quite deformed. Darleen Crocker 02/07/2024, 7:31 PM

## 2024-02-07 NOTE — Progress Notes (Signed)
Refused labs. MD made aware

## 2024-02-07 NOTE — ED Notes (Signed)
 Per request of Web designer, I have contacted Dr. Arlean Hopping with nephrology to ensuire he is aware of IV in same arm as fistula before giving blood.  Blood will be delayed until I here back.

## 2024-02-07 NOTE — Progress Notes (Signed)
 Orthopedic Tech Progress Note Patient Details:  Kirsten Mcmahon [birth year] [id number]  Ortho Devices Type of Ortho Device: Coapt, Sling immobilizer Ortho Device/Splint Location: LUE Ortho Device/Splint Interventions: Ordered, Application, Adjustment   Post Interventions Patient Tolerated: Poor Assisted ortho MD with splint application, sling and pillow provided for support of arm. Darleen Crocker 02/07/2024, 10:12 PM

## 2024-02-07 NOTE — Progress Notes (Signed)
 Patient arrived to 6N13 from ED. Alert to only self and agitated with 5/10 pain in fractured left arm. Bed in lowest position, bed alarm on, call light within reach. Will continue to monitor patient.

## 2024-02-07 NOTE — ED Notes (Signed)
Got patient on the monitor  °

## 2024-02-07 NOTE — ED Notes (Signed)
 Updated son on current status and plan of care.

## 2024-02-07 NOTE — ED Notes (Signed)
 Patient's son notified by RN on patient status/admission plan .

## 2024-02-07 NOTE — Plan of Care (Signed)
 Patient seen and rounded on in the ER this morning.  Admitted after midnight.  See H&P for full assessment and plan. 78 year old female with PMH ESRD on HD, dementia, seizure disorder, remote history of left shoulder fracture who was admitted with worsening confusion/mentation per her son.  She was not feeling well enough to go to dialysis on 02/06/2024.  Her last session was on 02/03/2024. Appears while she was being transferred in beds there was a pop in her left shoulder; x-ray showed an acute displaced distal humerus shaft fracture. Urinalysis was concerning for infection and she was started on Rocephin for UTI which was also suspected to be the possible culprit for her altered mentation. Hemoglobin is also down to 7.2 g/dL and she was ordered 1 unit PRBC after discussion with her son who was amenable with blood transfusion (blood refusal epic pop-up overridden after discussion).  Orthopedic surgery and nephrology were consulted on admission.  Plan: - known hx left arm fracture; now with acute distal humerus shaft fracture; orthopedic surgery consulted - follow up urine culture; continue Rocephin -Nephrology consulted, continue dialysis per recommendations; last session 02/03/2024 -Worsened anemia, possibly related to humerus fracture.  No obvious bleeding noted.  Hemoglobin 7.2 g/dL, transfuse 1 unit PRBC.  Discussed blood refusal popup with her son, [name] who was amenable with pursuing blood transfusion as patient unable to partake in any meaningful conversation this morning when seen  Lewie Chamber, MD [facility] 02/07/2024, 12:53 PM

## 2024-02-07 NOTE — ED Notes (Signed)
 Pt is resting. Rise and fall of chest noted.

## 2024-02-07 NOTE — ED Notes (Signed)
 Patient still refusing blood draw from RN , repositioned on bed , hospital gown applied . Kirsten Mcmahon

## 2024-02-07 NOTE — Progress Notes (Signed)
 Pt refused labs.

## 2024-02-07 NOTE — ED Provider Notes (Signed)
  Physical Exam  BP (!) 149/85 (BP Location: Right Arm)   Pulse 97   Temp 97.8 F (36.6 C) (Oral)   Resp (!) 22   SpO2 97%   Physical Exam  Procedures  Procedures  ED Course / MDM    Medical Decision Making Amount and/or Complexity of Data Reviewed Labs: ordered. Radiology: ordered.  Risk Prescription drug management.   Wardell Honour, have assumed care for this patient.  In brief 78 year old female, history of ESRD, Monday Wednesday Friday dialysis, did not receive dialysis today who presented to the emergency department today due to altered mental status.  Patient lives at home with son.  At baseline, patient does have dementia on donepezil, however son reports the patient is usually alert and oriented x 3.  States over last 1 day, patient has become increasingly confused.  Denies any falls or trauma.  Patient CT imaging of the head negative.  Patient does have a repeated left humerus fracture.  Compartments soft.  Given that it is an overnight, will hold off on consulting orthopedics, recommend day team consult Ortho, however this is likely nonoperative.  Blood work shows hemoglobin drop over the last 1 month.  No blood on DRE.  Urinalysis showing moderate leuks, many bacteria.  With her altered mental status, will treat with Rocephin.  Will admit to hospitalist.        Anders Simmonds T, DO 02/07/24 0300

## 2024-02-08 ENCOUNTER — Encounter (HOSPITAL_COMMUNITY): Payer: Self-pay | Admitting: Internal Medicine

## 2024-02-08 DIAGNOSIS — R410 Disorientation, unspecified: Secondary | ICD-10-CM

## 2024-02-08 DIAGNOSIS — G9340 Encephalopathy, unspecified: Secondary | ICD-10-CM | POA: Diagnosis not present

## 2024-02-08 LAB — BPAM RBC
Blood Product Expiration Date: 202503112359
ISSUE DATE / TIME: 202503041148
Unit Type and Rh: 600

## 2024-02-08 LAB — CALCIUM, IONIZED: Calcium, Ionized, Serum: 5.1 mg/dL (ref 4.5–5.6)

## 2024-02-08 LAB — CBC
HCT: 24.9 % — ABNORMAL LOW (ref 36.0–46.0)
Hemoglobin: 8.3 g/dL — ABNORMAL LOW (ref 12.0–15.0)
MCH: 32.8 pg (ref 26.0–34.0)
MCHC: 33.3 g/dL (ref 30.0–36.0)
MCV: 98.4 fL (ref 80.0–100.0)
Platelets: 217 10*3/uL (ref 150–400)
RBC: 2.53 MIL/uL — ABNORMAL LOW (ref 3.87–5.11)
RDW: 14.6 % (ref 11.5–15.5)
WBC: 12 10*3/uL — ABNORMAL HIGH (ref 4.0–10.5)
nRBC: 0 % (ref 0.0–0.2)

## 2024-02-08 LAB — TYPE AND SCREEN
ABO/RH(D): A NEG
Antibody Screen: NEGATIVE
Unit division: 0

## 2024-02-08 LAB — COMPREHENSIVE METABOLIC PANEL
ALT: 14 U/L (ref 0–44)
AST: 18 U/L (ref 15–41)
Albumin: 2.9 g/dL — ABNORMAL LOW (ref 3.5–5.0)
Alkaline Phosphatase: 53 U/L (ref 38–126)
Anion gap: 14 (ref 5–15)
BUN: 58 mg/dL — ABNORMAL HIGH (ref 8–23)
CO2: 26 mmol/L (ref 22–32)
Calcium: 9.8 mg/dL (ref 8.9–10.3)
Chloride: 101 mmol/L (ref 98–111)
Creatinine, Ser: 10.9 mg/dL — ABNORMAL HIGH (ref 0.44–1.00)
GFR, Estimated: 3 mL/min — ABNORMAL LOW (ref >60)
Glucose, Bld: 158 mg/dL — ABNORMAL HIGH (ref 70–99)
Potassium: 3.9 mmol/L (ref 3.5–5.1)
Sodium: 141 mmol/L (ref 135–145)
Total Bilirubin: 0.5 mg/dL (ref 0.0–1.2)
Total Protein: 6.5 g/dL (ref 6.5–8.1)

## 2024-02-08 LAB — HEPATITIS B SURFACE ANTIGEN: Hepatitis B Surface Ag: NONREACTIVE

## 2024-02-08 MED ORDER — HEPARIN SODIUM (PORCINE) 1000 UNIT/ML DIALYSIS: 2000.0000 [IU] | Freq: Once | INTRAMUSCULAR | Status: DC

## 2024-02-08 MED ORDER — PENTAFLUOROPROP-TETRAFLUOROETH EX AERO: 1.0000 | INHALATION_SPRAY | CUTANEOUS | Status: DC | PRN

## 2024-02-08 MED ORDER — HEPARIN SODIUM (PORCINE) 1000 UNIT/ML DIALYSIS: 1000.0000 [IU] | INTRAMUSCULAR | Status: DC | PRN

## 2024-02-08 MED ORDER — HEPARIN SODIUM (PORCINE) 1000 UNIT/ML IJ SOLN: 2000.0000 [IU] | Freq: Once | INTRAMUSCULAR | Status: AC

## 2024-02-08 MED ORDER — ALTEPLASE 2 MG IJ SOLR: 2.0000 mg | Freq: Once | INTRAMUSCULAR | Status: DC | PRN

## 2024-02-08 MED ORDER — HEPARIN SODIUM (PORCINE) 1000 UNIT/ML DIALYSIS
1000.0000 [IU] | INTRAMUSCULAR | Status: DC | PRN
  Administered 2024-02-08: 1000 [IU]

## 2024-02-08 MED ORDER — ANTICOAGULANT SODIUM CITRATE 4% (200MG/5ML) IV SOLN: 5.0000 mL | Status: DC | PRN

## 2024-02-08 MED ORDER — HEPARIN SODIUM (PORCINE) 1000 UNIT/ML IJ SOLN
INTRAMUSCULAR | Status: AC
  Administered 2024-02-08: 2000 [IU]
  Filled 2024-02-08: qty 3

## 2024-02-08 NOTE — Progress Notes (Signed)
 Progress Note   Patient: Kirsten Mcmahon JWJ:[id number] DOB: 1946/01/24 DOA: 02/06/2024     1 DOS: the patient was seen and examined on 02/08/2024   Brief hospital course: 78 y.o. female with medical history significant of end-stage renal disease on hemodialysis MWF, history of stroke in 1998 with residual left-sided weakness, dementia, presented history of seizure disorder, chronic anemia, hypertension, hypothyroidism, non-insulin-dependent diabetes mellitus and history of breast cancer (ER/PR/HER2-)status post radical mastectomy in 2024, on adjuvant therapy with anastrozole   She was brought in earlier in the day by son on account of altered mental status from baseline.  Patient was reported to be agitated on arrival   Assessment and Plan: Acute encephalopathy: Metabolic etiology likely from UTI.   -CT scan shows no acute abnormality. -Cont with supportive care. Cont rocephin per below as tolerated   Dementia: With increased delirium and behavioral abnormality.  Agitation protocol was instituted in the ED -Cont with supportive care   History of seizure disorder:  -Patient is on Vimpat on days of dialysis.   -continued home meds -Seizure precautions in place with IV Ativan as needed for seizures.   End-stage renal disease:  -Nephrology following for HD. Pt was seen on HD today -Cont to follow bmet trendsd  Urinary tract infection:  -UA is suggestive of UTI in the setting of increased mental status change with leukocytosis -urine cx pending, thus far pos for >100,000 ecoli -cont with empiric rocephin    Closed left humeral fracture: -Acutely displaced distal humerus shaft fracture whilst patient was being cared for by nurses.  Likely pathologic fracture from osteoporosis.   -Orthopedics consulted. Recs to cont NWB LUE in splint -f/u with PT/OT recs -Ortho to f/u in 2 weeks   Acute on chronic anemia:  -Patient looks euvolemic at this time.   -Pt was transfused 1 unit of PRBC.   Baseline hemoglobin of ~10, target hemoglobin greater than 8.    History of CVA with residual left-sided weakness: CT scan without any acute intracranial moiety.  Chronic ischemic changes were noted.   Hypercalcemia: Hold calcitriol.  Will send ionized calcium levels.  Tumor lysis syndrome less likely.   Essential hypertension:  -BP stable at present -Per chart review, pt's son is not giving metoprolol out of fear of hypotension, especially while on HD   History of breast cancer status post radical mastectomy.  Currently on adjuvant chemotherapy.   Hypothyroidism:: Continued with Synthroid.       Subjective: Seen on HD. Without complaints  Physical Exam: Vitals:   02/08/24 1230 02/08/24 1237 02/08/24 1259 02/08/24 1456  BP: 127/67 128/67 (!) 148/66 135/77  Pulse: 100 (!) 105 89 95  Resp: 18 (!) 33 14 18  Temp:    98.7 F (37.1 C)  TempSrc:      SpO2: 100% 100% 100% 100%  Weight:      Height:       General exam: Awake, laying in bed, in nad Respiratory system: Normal respiratory effort, no wheezing Cardiovascular system: regular rate, s1, s2 Gastrointestinal system: Soft, nondistended, positive BS Central nervous system: CN2-12 grossly intact, strength intact Extremities: Perfused, no clubbing Skin: Normal skin turgor, no notable skin lesions seen Psychiatry: Mood normal // no visual hallucinations   Data Reviewed:  Labs reviewed: Na 141, K 3.9, WBC 12.0, Hgb 8.3, Plts 217  Family Communication: Pt in room, family not at bedside  Disposition: Status is: Inpatient Remains inpatient appropriate because: severity of illness  Planned Discharge Destination: Skilled nursing  facility    Author: Rickey Barbara, MD 02/08/2024 4:06 PM  For on call review [email or web address].

## 2024-02-08 NOTE — Progress Notes (Signed)
 PT Cancellation Note  Patient Details Name: Kirsten Mcmahon MRN: [id number] DOB: Apr 09, 1946   Cancelled Treatment:    Reason Eval/Treat Not Completed: Patient at procedure or test/unavailable (Patient is still off the floor at hemodialysis. PT will continue with attempts)  Donna Bernard, PT, MPT   Ina Homes 02/08/2024, 2:23 PM

## 2024-02-08 NOTE — Procedures (Signed)
 HD Note:  Some information was entered later than the data was gathered due to patient care needs. The stated time with the data is accurate.  Received patient in bed to unit.   Alert and disoriented x 4.  Informed consent signed and in chart.   Access used: Upper right arm graft Access issues: Patient unable to remember not to move, soft wrist restraint applied to access arm.  This will only be on during dialysis treatment.  Dr. Arlean Hopping increased the UF goal to 2000 ml during rounding. Patient tolerated treatment well.   TX duration:3.5 hours  Alert, without acute distress.  Total UF removed: 2000 ml  Hand-off given to patient's nurse.   Transported back to the room   Lilou Kneip L. Dareen Piano, RN [facility] Dialysis Unit.

## 2024-02-08 NOTE — Evaluation (Signed)
 Physical Therapy Evaluation Patient Details Name: Kirsten Mcmahon MRN: [id number] DOB: 1946/02/25 Today's Date: 02/08/2024  History of Present Illness  Patient is a 78 year old female presenting with altered mental status. Found to have acute fracture of the L humerus, treated non-operatively with padded splint and sling. History of  dementia,ESRD, CVA with left residual weakness, seizure disorder.   Clinical Impression  Patient is confused but cooperative with assessment. She reports she lives with her son who helps her get to a wheelchair. She is agreeable to sit up with assistance. Sling in place and adjusted to support the left wrist as well for comfort. She reports no pain with mobility today. Max A required for bed mobility. Sitting balance is poor with posterior lean initially that improved with increased sitting time. Patient is fatigued with activity. Anticipate patient will need +2 person assistance for transfers. Recommend to continue PT to maximize independence and to decrease caregiver burden. Consider rehabilitation < 3 hours/day pending patient/family goals. She will need continued physical assistance if going home.       If plan is discharge home, recommend the following: A lot of help with walking and/or transfers;A lot of help with bathing/dressing/bathroom;Assist for transportation;Help with stairs or ramp for entrance;Supervision due to cognitive status;Assistance with cooking/housework;Direct supervision/assist for medications management;Direct supervision/assist for financial management   Can travel by private vehicle   No    Equipment Recommendations None recommended by PT  Recommendations for Other Services       Functional Status Assessment Patient has had a recent decline in their functional status and demonstrates the ability to make significant improvements in function in a reasonable and predictable amount of time.     Precautions / Restrictions  Precautions Precautions: Fall Recall of Precautions/Restrictions: Impaired Required Braces or Orthoses: Sling Splint/Cast: padded splint applied by MD Restrictions Weight Bearing Restrictions Per Provider Order: Yes LUE Weight Bearing Per Provider Order: Non weight bearing      Mobility  Bed Mobility Overal bed mobility: Needs Assistance Bed Mobility: Supine to Sit, Sit to Supine     Supine to sit: Max assist Sit to supine: Max assist   General bed mobility comments: assistance for trunk and BLE support. increased time required for task initiation    Transfers                   General transfer comment: not attempted due to poor sitting tolerance and posterior lean. presumably patient will require +2 person for safety    Ambulation/Gait                  Stairs            Wheelchair Mobility     Tilt Bed    Modified Rankin (Stroke Patients Only)       Balance Overall balance assessment: Needs assistance Sitting-balance support: Feet supported Sitting balance-[name] Scale: Poor Sitting balance - Comments: posterior lean, more pronounced initially. Min A required with intermittent periods of SBA.                                     Pertinent Vitals/Pain Pain Assessment Pain Assessment: No/denies pain    Home Living Family/patient expects to be discharged to:: Private residence Living Arrangements: Children (son) Available Help at Discharge: Family (previosly with aide at home) Type of Home: House Home Access: Ramped entrance  Home Layout: One level Home Equipment: BSC/3in1;Other (comment);Hospital bed;Tub bench;Grab bars - tub/shower;Hand held shower head;Grab bars - toilet;Cane - single point Additional Comments: most of this information is from a prior admission    Prior Function Prior Level of Function : Needs assist;Patient poor historian/Family not available             Mobility Comments: patient  reports she needs help for pivot transfer to wheelchair ADLs Comments: assistance required, unclear     Extremity/Trunk Assessment   Upper Extremity Assessment Upper Extremity Assessment: LUE deficits/detail;Difficult to assess due to impaired cognition LUE Deficits / Details: ROM not assessed- LUE in splint/sling. sling adjusted to support the wrist as well for comfort. no movement noted in the left hand. residual weakness from prior stroke. no pain reported.    Lower Extremity Assessment Lower Extremity Assessment: Generalized weakness;LLE deficits/detail;Difficult to assess due to impaired cognition (unable to follow commands for formal MMT) LLE Deficits / Details: left side weakness from prior stroke.       Communication   Communication Communication: Impaired Factors Affecting Communication: Difficulty expressing self (minimally verbal , usually answers questions with yes/no)    Cognition Arousal: Alert Behavior During Therapy: WFL for tasks assessed/performed   PT - Cognitive impairments: No family/caregiver present to determine baseline, Sequencing, Orientation   Orientation impairments: Place, Time, Situation                   PT - Cognition Comments: patient is cooperative throughout session Following commands: Impaired Following commands impaired: Follows one step commands with increased time     Cueing Cueing Techniques: Verbal cues, Tactile cues     General Comments General comments (skin integrity, edema, etc.): no pain is reported at rest or with mobility    Exercises     Assessment/Plan    PT Assessment Patient needs continued PT services  PT Problem List Decreased strength;Decreased range of motion;Decreased activity tolerance;Decreased balance;Decreased mobility;Decreased safety awareness;Pain;Decreased cognition       PT Treatment Interventions DME instruction;Functional mobility training;Therapeutic activities;Therapeutic exercise;Balance  training;Patient/family education;Wheelchair mobility training;Neuromuscular re-education;Cognitive remediation    PT Goals (Current goals can be found in the Care Plan section)  Acute Rehab PT Goals Patient Stated Goal: patient unable to participate with goal setting PT Goal Formulation: Patient unable to participate in goal setting Time For Goal Achievement: 02/22/24 Potential to Achieve Goals: Fair    Frequency Min 2X/week     Co-evaluation               AM-PAC PT "6 Clicks" Mobility  Outcome Measure Help needed turning from your back to your side while in a flat bed without using bedrails?: A Lot Help needed moving from lying on your back to sitting on the side of a flat bed without using bedrails?: A Lot Help needed moving to and from a bed to a chair (including a wheelchair)?: Total Help needed standing up from a chair using your arms (e.g., wheelchair or bedside chair)?: Total Help needed to walk in hospital room?: Total Help needed climbing 3-5 steps with a railing? : Total 6 Click Score: 8    End of Session Equipment Utilized During Treatment:  (sling LUE) Activity Tolerance: Patient tolerated treatment well Patient left: in bed;with call bell/phone within reach;with bed alarm set;with nursing/sitter in room Nurse Communication: Mobility status PT Visit Diagnosis: Muscle weakness (generalized) (M62.81);Unsteadiness on feet (R26.81)    Time: 0960-4540 PT Time Calculation (min) (ACUTE ONLY): 15 min   Charges:  PT Evaluation $PT Eval Moderate Complexity: 1 Mod   PT General Charges $$ ACUTE PT VISIT: 1 Visit         Donna Bernard, PT, MPT   Ina Homes 02/08/2024, 3:15 PM

## 2024-02-08 NOTE — Hospital Course (Signed)
 78 y.o. female with medical history significant of end-stage renal disease on hemodialysis MWF, history of stroke in 1998 with residual left-sided weakness, dementia, presented history of seizure disorder, chronic anemia, hypertension, hypothyroidism, non-insulin-dependent diabetes mellitus and history of breast cancer (ER/PR/HER2-)status post radical mastectomy in 2024, on adjuvant therapy with anastrozole   She was brought in earlier in the day by son on account of altered mental status from baseline.  Patient was reported to be agitated on arrival

## 2024-02-08 NOTE — Progress Notes (Signed)
 [facility] Progress Note  Subjective:  Seen in HD Remains confused but more alert today  Vitals:   02/08/24 0019 02/08/24 0543 02/08/24 0825 02/08/24 0848  BP: 109/85 (!) 162/81 (!) 158/80   Pulse: 85 85 81 74  Resp: 18 16 13 14   Temp: 98.6 F (37 C) 98.6 F (37 C) 98 F (36.7 C)   TempSrc: Oral Oral    SpO2: 98% 99% 100% 98%  Weight:   61.8 kg   Height:        Exam: Gen more alert today, but still confused No jvd or bruits Chest clear bilat to bases RRR no RG Abd soft ntnd no mass or ascites +bs MS left upper arm in dressing now Ext 1+ LUE edema, no LE edema Neuro is Ox 1, alert today    RUA AVF+bruit     Renal-related home meds: - metoprolol xl 25 every day - velphoro 500 ac tid       OP HD: MWF G-O  3h  B350   62.5kg  2K bath R AVG   heparin 2000 - last HD 2/28, post HD 62.4kg - rocaltrol 1.5 mcg po three times per week - sensipar 30mg  three times per week - last Hb 8.2, not on esa    BP 170 79, HR 89- 100, RR 16-22, temp 98   RA 100%  CXR 3/30 - no active disease    Na 142  K+ 5.3  CO2 30  BUN 45  creat 9.22    Hb 7.2  WBC 10K     Assessment/ Plan: Acute encephalopathy - pt w/ hx dementia, esrd , hx CVA w/ possible UTI. More alert today.  UTI - suspected, getting IV abx L humerus fracture - per ortho  ESRD - on HD MWF. Missed HD Monday. HD today in progress HTN - bp's slightly up, cont home meds.  Volume - No edema on exam, CXR clear. 1kg under, BP's still up, UF 2-2.5 L as tol  Anemia of esrd - Hb 7- 8 range. Transfuse for Hb < 7. Not on esa, not sure why, did have breast cancer in 2017.  Secondary hyperparathyroidism - CCa is high, hold any vdra's.  Dementia  H/o CVA - w/ L sided weakness    Vinson Moselle MD  [facility] 02/08/2024, 9:31 AM  Recent Labs  Lab 02/07/24 0041 02/08/24 0647  HGB 7.2* 8.3*  ALBUMIN 3.1* 2.9*  CALCIUM 11.1* 9.8  CREATININE 9.22* 10.90*  K 5.3* 3.9   No results for input(s): "IRON", "TIBC",  "FERRITIN" in the last 168 hours. Inpatient medications:  anastrozole  1 mg Oral Daily   aspirin EC  81 mg Oral Daily   Chlorhexidine Gluconate Cloth  6 each Topical Q0600   docusate sodium  100 mg Oral BID   donepezil  10 mg Oral QHS   [START ON 02/09/2024] heparin  2,000 Units Dialysis Once in dialysis   heparin  5,000 Units Subcutaneous Q8H   heparin sodium (porcine)       heparin sodium (porcine)  2,000 Units Intracatheter Once   lacosamide  150 mg Oral BID   levothyroxine  112 mcg Oral QAC breakfast   mouth rinse  15 mL Mouth Rinse Q2H    anticoagulant sodium citrate     cefTRIAXone (ROCEPHIN)  IV 1 g (02/08/24 0558)   acetaminophen **OR** acetaminophen, albuterol, alteplase, anticoagulant sodium citrate, heparin, [START ON 02/09/2024] heparin, heparin sodium (porcine), mouth rinse, pentafluoroprop-tetrafluoroeth

## 2024-02-08 NOTE — Plan of Care (Signed)
   Problem: Activity: Goal: Risk for activity intolerance will decrease Outcome: Progressing   Problem: Nutrition: Goal: Adequate nutrition will be maintained Outcome: Progressing   Problem: Pain Managment: Goal: General experience of comfort will improve and/or be controlled Outcome: Progressing   Problem: Safety: Goal: Ability to remain free from injury will improve Outcome: Progressing

## 2024-02-08 NOTE — Progress Notes (Signed)
 OT Cancellation Note  Patient Details Name: Kirsten Mcmahon MRN: [id number] DOB: October 26, 1946   Cancelled Treatment:    Reason Eval/Treat Not Completed: Patient at procedure or test/ unavailable. Pt is at HD. Will follow up as able.  Ivor Messier, OT  Acute Rehabilitation Services Office [phone] Secure chat preferred   [name] 02/08/2024, 9:26 AM

## 2024-02-08 NOTE — Progress Notes (Signed)
 Pt receives out-pt HD at [facility] on MWF. Will assist as needed.   Olivia Canter Renal Navigator [phone]

## 2024-02-09 ENCOUNTER — Inpatient Hospital Stay (HOSPITAL_COMMUNITY)

## 2024-02-09 ENCOUNTER — Telehealth: Payer: Self-pay | Admitting: Orthopaedic Surgery

## 2024-02-09 DIAGNOSIS — R569 Unspecified convulsions: Secondary | ICD-10-CM

## 2024-02-09 DIAGNOSIS — R4182 Altered mental status, unspecified: Secondary | ICD-10-CM | POA: Diagnosis not present

## 2024-02-09 DIAGNOSIS — G9340 Encephalopathy, unspecified: Secondary | ICD-10-CM | POA: Diagnosis not present

## 2024-02-09 DIAGNOSIS — R410 Disorientation, unspecified: Secondary | ICD-10-CM | POA: Diagnosis not present

## 2024-02-09 LAB — URINE CULTURE: Culture: >=100000 — AB

## 2024-02-09 LAB — HEPATITIS B SURFACE ANTIBODY, QUANTITATIVE: Hep B S AB Quant (Post): 315 m[IU]/mL

## 2024-02-09 MED ORDER — FENTANYL CITRATE PF 50 MCG/ML IJ SOSY
12.5000 ug | PREFILLED_SYRINGE | INTRAMUSCULAR | Status: DC | PRN
  Administered 2024-02-09 – 2024-02-10 (×2): 12.5 ug via INTRAVENOUS
  Filled 2024-02-09 (×2): qty 1

## 2024-02-09 NOTE — Telephone Encounter (Signed)
 Patient 's son [name] states he just missed a call from Dr Roda Shutters. [name] request a call back

## 2024-02-09 NOTE — Plan of Care (Signed)
  Problem: Nutrition: Goal: Adequate nutrition will be maintained Outcome: Progressing   Problem: Education: Goal: Knowledge of General Education information will improve Description: Including pain rating scale, medication(s)/side effects and non-pharmacologic comfort measures Outcome: Not Progressing   Problem: Activity: Goal: Risk for activity intolerance will decrease Outcome: Not Progressing   

## 2024-02-09 NOTE — Progress Notes (Signed)
 MD notified about family member's request for a status update.

## 2024-02-09 NOTE — Progress Notes (Signed)
  [facility] Progress Note   Subjective: Seen in room. Dialysis yesterday with net 2L. Comfortable today. No complaints.   Objective Vitals:   02/08/24 1456 02/08/24 1952 02/09/24 0406 02/09/24 0913  BP: 135/77 (!) 147/78 (!) 144/75 138/85  Pulse: 95 91 (!) 104 93  Resp: 18 15 16 17   Temp: 98.7 F (37.1 C)   98.7 F (37.1 C)  TempSrc:    Oral  SpO2: 100% 93% 100% 100%  Weight:      Height:        Additional Objective Labs: Basic Metabolic Panel: Recent Labs  Lab 02/07/24 0041 02/08/24 0647  NA 142 141  K 5.3* 3.9  CL 98 101  CO2 30 26  GLUCOSE 136* 158*  BUN 45* 58*  CREATININE 9.22* 10.90*  CALCIUM 11.1* 9.8   CBC: Recent Labs  Lab 02/07/24 0041 02/08/24 0647  WBC 10.4 12.0*  HGB 7.2* 8.3*  HCT 22.1* 24.9*  MCV 101.8* 98.4  PLT 129* 217   Blood Culture    Component Value Date/Time   SDES URINE, CLEAN CATCH 02/07/2024 0851   SPECREQUEST  02/07/2024 0851    NONE Performed at [facility], [address] [place], [place] [address]    CULT >=100,000 COLONIES/mL ESCHERICHIA COLI (A) 02/07/2024 0851   REPTSTATUS 02/09/2024 FINAL 02/07/2024 0851   Physical Exam General: Lying in bed, nad Heart: RRR Lungs: Clear, normal wob Abdomen: soft  Extremities: LUE in ACE wrap Dialysis Access: R AVG+bruit   Medications:  cefTRIAXone (ROCEPHIN)  IV 1 g (02/09/24 0439)    anastrozole  1 mg Oral Daily   aspirin EC  81 mg Oral Daily   Chlorhexidine Gluconate Cloth  6 each Topical Q0600   docusate sodium  100 mg Oral BID   donepezil  10 mg Oral QHS   heparin  5,000 Units Subcutaneous Q8H   lacosamide  150 mg Oral BID   levothyroxine  112 mcg Oral QAC breakfast   mouth rinse  15 mL Mouth Rinse Q2H    Dialysis Orders:  MWF G-O  3h  B350   62.5kg  2K bath R AVG   heparin 2000 - last HD 2/28, post HD 62.4kg - rocaltrol 1.5 mcg po three times per week - sensipar 30mg  three times per week - last Hb 8.2, not on esa     Assessment/Plan: Acute encephalopathy - H/o of dementia. Head CT neg. Here with UTI  E. Coli UTI - on Rocephin  L humerus fracture - ortho consulted  ESRD - on HD MWF. Continue on schedule.  HTN - bp's slightly up, cont home meds.  Volume - No edema on exam, CXR clear. 1kg under, BP's still up, UF 2-2.5 L as tol  Anemia of esrd - Hb 7- 8 range. Transfuse for Hb < 7. Not on esa, not sure why, did have breast cancer in 2017.  Secondary hyperparathyroidism - CCa is high, hold any vdra's.  Dementia -  H/o CVA - w/ L sided weakness  Tomasa Blase PA-C [facility] 02/09/2024,1:44 PM

## 2024-02-09 NOTE — Progress Notes (Signed)
 Progress Note   Patient: Kirsten Mcmahon ZOX:[id number] DOB: 05/11/1946 DOA: 02/06/2024     2 DOS: the patient was seen and examined on 02/09/2024   Brief hospital course: 78 y.o. female with medical history significant of end-stage renal disease on hemodialysis MWF, history of stroke in 1998 with residual left-sided weakness, dementia, presented history of seizure disorder, chronic anemia, hypertension, hypothyroidism, non-insulin-dependent diabetes mellitus and history of breast cancer (ER/PR/HER2-)status post radical mastectomy in 2024, on adjuvant therapy with anastrozole   She was brought in earlier in the day by son on account of altered mental status from baseline.  Patient was reported to be agitated on arrival   Assessment and Plan: Acute encephalopathy:  -Metabolic etiology likely from UTI.   -CT scan shows no acute abnormality. -Cont with supportive care. Cont rocephin per below as tolerated   Dementia:  -With increased delirium and behavioral abnormality.   -Continue agitation protocol as instituted in the ED -Cont with supportive care   History of seizure disorder:  -Patient is on Vimpat on days of dialysis.  -Followed by Neurology as outpatient, last seen on 12/24  -continued home meds -Seizure precautions in place with IV Ativan as needed for seizures. -Will f/u EEG   End-stage renal disease:  -Nephrology following for HD.  -Cont HD as per Nephrology  Urinary tract infection:  -UA is suggestive of UTI in the setting of increased mental status change with leukocytosis -urine cx pending, thus far pos for >100,000 pan-sensitive ecoli -cont with empiric rocephin    Closed left humeral fracture: -Acutely displaced distal humerus shaft fracture whilst patient was being cared for by nurses.  Likely pathologic fracture from osteoporosis.   -Orthopedics consulted. Recs to cont NWB LUE in splint, recs for non-operative management -f/u with PT/OT recs -Ortho to f/u in 2  weeks   Acute on chronic anemia:  -Patient looks euvolemic at this time.   -Pt was transfused 1 unit of PRBC.  Baseline hemoglobin of ~10, target hemoglobin greater than 8.    History of CVA with residual left-sided weakness: CT scan without any acute intracranial moiety.  Chronic ischemic changes were noted.   Hypercalcemia: Hold calcitriol.  Will send ionized calcium levels.  Tumor lysis syndrome less likely.   Essential hypertension:  -BP stable at present -Per chart review, pt's son is not giving metoprolol out of fear of hypotension, especially while on HD   History of breast cancer status post radical mastectomy.  Currently on adjuvant chemotherapy.   Hypothyroidism:: Continued with Synthroid.       Subjective: Feeling nauseated this AM  Physical Exam: Vitals:   02/08/24 1456 02/08/24 1952 02/09/24 0406 02/09/24 0913  BP: 135/77 (!) 147/78 (!) 144/75 138/85  Pulse: 95 91 (!) 104 93  Resp: 18 15 16 17   Temp: 98.7 F (37.1 C)   98.7 F (37.1 C)  TempSrc:    Oral  SpO2: 100% 93% 100% 100%  Weight:      Height:       General exam: Conversant, in no acute distress Respiratory system: normal chest rise, clear, no audible wheezing Cardiovascular system: regular rhythm, s1-s2 Gastrointestinal system: Nondistended, nontender, pos BS Central nervous system: No seizures, no tremors Extremities: No cyanosis, no joint deformities Skin: No rashes, no pallor Psychiatry: Affect normal // no auditory hallucinations   Data Reviewed:  There are no new results to review at this time.  Family Communication: Pt in room, pt's son over phone  Disposition: Status is: Inpatient  Remains inpatient appropriate because: severity of illness  Planned Discharge Destination: Skilled nursing facility    Author: Rickey Barbara, MD 02/09/2024 5:27 PM  For on call review [email or web address].

## 2024-02-09 NOTE — Telephone Encounter (Signed)
 That must be a mistake.  I have not seen the patient in 6 years

## 2024-02-09 NOTE — Procedures (Signed)
 Patient Name: Kirsten Mcmahon  MRN: [id number]  Epilepsy Attending: [name]  Referring Physician/Provider: Jerald Kief, MD  Date: 02/09/2024 Duration: 25.18 mins  Patient history: 78yo F with ams. EEG to evaluate for seizure  Level of alertness: Awake  AEDs during EEG study: LCM  Technical aspects: This EEG study was done with scalp electrodes positioned according to the 10-20 International system of electrode placement. Electrical activity was reviewed with band pass filter of 1-70Hz , sensitivity of 7 uV/mm, display speed of 24mm/sec with a 60Hz  notched filter applied as appropriate. EEG data were recorded continuously and digitally stored.  Video monitoring was available and reviewed as appropriate.  Description: EEG showed continuous generalized 3 to 6 Hz theta-delta slowing. There are also sharply contoured waves in the right centro-parietal region consistent with underlying breach artifact. Spikes were noted in right centro-parietal region. Hyperventilation and photic stimulation were not performed.     ABNORMALITY -Spike,right centro-parietal region -Breach artifact, right centro-parietal region -Continuous slow, generalized   IMPRESSION: This study is consistent with patient's history of epilepsy arising from centro-parietal region. Additionally there is evidence of cortical dysfunction in right centro-parietal region consistent with prior craniotomy and underlying stroke. Lastly there is also moderate degree of encephalopathy.  No seizures were seen throughout the recording.   [name]

## 2024-02-09 NOTE — TOC Initial Note (Signed)
 Transition of Care [facility]) - Initial/Assessment Note    Patient Details  Name: Kirsten Mcmahon MRN: [id number] Date of Birth: December 15, 1945  Transition of Care [facility]) CM/SW Contact:    Carley Hammed, LCSW Phone Number: 02/09/2024, 10:56 AM  Clinical Narrative:                 CSW noted pt is disoriented at this time and spoke with son [name]. Per [name], pt lives at home with him and he would like for her to return home. Pt currently has a WC. She had a hoyer, but [facility] had provided it, so took it when they DC services. Per Son, she does not need one as he can physically move pt without it. He is interested in a hospital bed. Son notes he is interested in [facility] and has not been able to get it set up. No preference for company, but agreeable to any service that can be offered. Pt will need PTAR home, address in chart confirmed. CSW consulted CM who ordered Hospital bed and set up services with [facility]. [facility] following with PT,OT,RN,Aide, and SW. Per MD pt likely to DC 1-2 days.   Pt's currently has an intake appt for a PCP with [facility] on [address]. Pt will need a PCP to follow prior to [facility] starting. Per son, Pt had been treated by [facility], and was supposed to go to PCP this week, but was unfortunately hospitalized.   CM able to confirm her appt with [facility] on [address]. To be placed on DC paperwork. CM confirmed with HH they can start assessment and begin treatment after PCP appt. CM confirmed with son of changes. Per son, Family will be with her 24/7. He will come up at [facility] tomorrow to work with PT/OT prior to DC. TOC will continue to follow.   Expected Discharge Plan: Skilled Nursing Facility Barriers to Discharge: Continued Medical Work up, Transportation, English as a second language teacher, Equipment Delay   Patient Goals and CMS Choice Patient states their goals for this hospitalization and ongoing recovery are:: Pt disoriented and unable to participate in goal setting. CMS Medicare.gov Compare  Post Acute Care list provided to:: Patient Represenative (must comment) Choice offered to / list presented to : Adult Children      Expected Discharge Plan and Services     Post Acute Care Choice: Home Health Living arrangements for the past 2 months: Single Family Home                           HH Arranged: PT, OT          Prior Living Arrangements/Services Living arrangements for the past 2 months: Single Family Home Lives with:: Adult Children Patient language and need for interpreter reviewed:: Yes Do you feel safe going back to the place where you live?: Yes      Need for Family Participation in Patient Care: Yes (Comment) Care giver support system in place?: Yes (comment) Current home services: DME Criminal Activity/Legal Involvement Pertinent to Current Situation/Hospitalization: No - Comment as needed  Activities of Daily Living   ADL Screening (condition at time of admission) Independently performs ADLs?: No Does the patient have a NEW difficulty with bathing/dressing/toileting/self-feeding that is expected to last >3 days?: No Does the patient have a NEW difficulty with getting in/out of bed, walking, or climbing stairs that is expected to last >3 days?: No Does the patient have a NEW difficulty with communication  that is expected to last >3 days?: No Is the patient deaf or have difficulty hearing?: No Does the patient have difficulty seeing, even when wearing glasses/contacts?: No Does the patient have difficulty concentrating, remembering, or making decisions?: No  Permission Sought/Granted Permission sought to share information with : Family Supports Permission granted to share information with : Yes, Verbal Permission Granted  Share Information with NAME: [name]     Permission granted to share info w Relationship: Son     Emotional Assessment Appearance:: Appears stated age Attitude/Demeanor/Rapport: Unable to Assess Affect (typically observed):  Unable to Assess Orientation: : Oriented to Self Alcohol / Substance Use: Not Applicable Psych Involvement: No (comment)  Admission diagnosis:  Encephalopathy acute [G93.40] Patient Active Problem List   Diagnosis Date Noted   Encephalopathy acute 02/07/2024   Acute cystitis without hematuria 02/07/2024   ABLA (acute blood loss anemia) 02/07/2024   Closed fracture of left distal humerus 02/07/2024   Cancer of right female breast (HCC) 12/08/2023   Seizures (HCC) 05/31/2023   History of breast cancer 05/31/2023   History of CVA (cerebrovascular accident) 05/31/2023   Delirium due to known physiological condition 08/03/2021   Seizure (HCC) 07/17/2021   Mixed diabetic hyperlipidemia associated with type 2 diabetes mellitus (HCC) 07/16/2021   Recurrent seizures (HCC) 07/15/2021   Encounter for immunization 07/14/2021   Pain, unspecified 07/13/2021   Hypokalemia 06/02/2021   Other pneumonia, unspecified organism 05/26/2021   Recent cerebrovascular accident (CVA) 05/17/2021   History of CVA with residual deficit 05/17/2021   Cerebral embolism with cerebral infarction 05/13/2021   Physical deconditioning 04/30/2021   Generalized muscle weakness 04/30/2021   Pressure injury of skin 04/29/2021   AKI (acute kidney injury) (HCC) 04/28/2021   Moderate protein-calorie malnutrition (HCC) 04/28/2021   Other bacterial infections of unspecified site 04/28/2021   Streptococcal sepsis, unspecified (HCC) 04/18/2021   Streptococcal bacteremia 04/18/2021   Leukocytosis 04/17/2021   Fever 04/17/2021   Allergy, unspecified, initial encounter 04/16/2021   Anaphylactic shock, unspecified, initial encounter 04/16/2021   Complication of vascular dialysis catheter 04/16/2021   Iron deficiency anemia, unspecified 04/16/2021   Pruritus, unspecified 04/16/2021   Secondary hyperparathyroidism of renal origin (HCC) 04/16/2021   Type 2 diabetes mellitus with diabetic peripheral angiopathy without gangrene  (HCC) 04/16/2021   End-stage renal disease on hemodialysis (HCC)    Acute kidney injury superimposed on CKD (HCC) 04/09/2021   ARF (acute renal failure) (HCC) 01/18/2021   Fall 01/17/2021   Essential hypertension    Hypothyroidism    Stroke (HCC)    GERD (gastroesophageal reflux disease)    Anemia in chronic kidney disease (CKD)    Acute metabolic encephalopathy    Acute renal failure superimposed on stage 3b chronic kidney disease (HCC)    Type 2 diabetes mellitus with ESRD (end-stage renal disease) (HCC)    Abnormal mammogram of left breast 07/30/2018   Closed displaced oblique fracture of shaft of left humerus 03/14/2018   Osteopenia 01/20/2018   Multiple thyroid nodules 07/13/2017   Tracheal deviation 07/13/2017   Malignant neoplasm of upper-outer quadrant of right breast in female, estrogen receptor positive (HCC) 01/20/2017   Primary vulvar squamous cell carcinoma (HCC) 01/20/2017   PCP:  [facility] Pharmacy:   [id number] - [place], [place] - [address] AT [facility] [address] [place] [address] Phone: [phone] Fax: [phone]     Social Drivers of Health (SDOH) Social History: SDOH Screenings   Food Insecurity:  Patient Declined (02/07/2024)  Housing: Patient Declined (02/07/2024)  Transportation Needs: Patient Declined (02/07/2024)  Utilities: Patient Declined (02/07/2024)  Social Connections: Patient Declined (02/07/2024)  Tobacco Use: Low Risk  (02/06/2024)   SDOH Interventions:     Readmission Risk Interventions    05/20/2021    2:15 PM  Readmission Risk Prevention Plan  Transportation Screening Complete  Medication Review (RN Care Manager) Complete  PCP or Specialist appointment within 3-5 days of discharge Complete  HRI or Home Care Consult Complete  Palliative Care Screening Complete  Skilled Nursing Facility Patient Refused

## 2024-02-09 NOTE — Evaluation (Signed)
 Occupational Therapy Evaluation Patient Details Name: Kirsten Mcmahon MRN: [id number] DOB: 1946/02/22 Today's Date: 02/09/2024   History of Present Illness   Patient is a 78 year old female presenting with altered mental status. Found to have acute fracture of the L humerus, treated non-operatively with padded splint and sling. History of  dementia,ESRD, CVA with left residual weakness, seizure disorder.     Clinical Impressions Pt lethargic, able to reply inconsistently, poor historian, screams out in pain with any LUE movement. Pt lives with son who assists with transfers to w/c and all ADLs/IADLs, Pt is typically able to participate with min-mod assist. Per PT note, yesterday Pt was able to follow commands and participate, max assist to sit on EOB with poor balance and able to feed herself with RUE. Today Pt not able to follow commands or perform AROM to RUE, not able to assist with rolling or positioning in bed, barely responded to yes/no questions. Pt total assist for all ADLs today. Pt's son plans to bring her home and take care of all her needs. Pt would benefit from hospital bed upon return home, Pt's son states they have all other DME needed to remain safe/independent. Will meet with son tomorrow to go over DC plan, safety precautions, and ensure all Pt's needs are met. Pt would benefit from [facility] to maximize functional strength and participation with ADLs/transfers, will continue to follow acutely to progress as able.      If plan is discharge home, recommend the following:   Two people to help with walking and/or transfers;Two people to help with bathing/dressing/bathroom;Assistance with cooking/housework;Assistance with feeding;Direct supervision/assist for medications management;Assist for transportation;Help with stairs or ramp for entrance;Supervision due to cognitive status     Functional Status Assessment   Patient has had a recent decline in their functional status and  demonstrates the ability to make significant improvements in function in a reasonable and predictable amount of time.     Equipment Recommendations   Hospital bed     Recommendations for Other Services         Precautions/Restrictions   Precautions Precautions: Fall Recall of Precautions/Restrictions: Impaired Required Braces or Orthoses: Sling Splint/Cast: padded splint applied by MD Restrictions Weight Bearing Restrictions Per Provider Order: Yes LUE Weight Bearing Per Provider Order: Non weight bearing     Mobility Bed Mobility Overal bed mobility: Needs Assistance Bed Mobility: Rolling Rolling: Total assist         General bed mobility comments: total for mobility to day, not able to functionally participate    Transfers Overall transfer level: Needs assistance                 General transfer comment: not attempted, at bed level      Balance                                           ADL either performed or assessed with clinical judgement   ADL Overall ADL's : Needs assistance/impaired                                       General ADL Comments: total for all ADLs at this time, likely due to lethargy and decreased cognition, Pt was feeding herself yesterday.     Vision  Perception         Praxis         Pertinent Vitals/Pain Pain Assessment Pain Assessment: Faces Faces Pain Scale: Hurts whole lot Pain Location: LUE, with movement Pain Descriptors / Indicators: Aching Pain Intervention(s): Monitored during session     Extremity/Trunk Assessment Upper Extremity Assessment Upper Extremity Assessment: LUE deficits/detail;RUE deficits/detail RUE Deficits / Details: Pt lethargic, not able to perform AROM when asked, little to no grip strength, barely able to flex at elbow, not able to lift at shoulder. Likely due to decreased cognition/lethargy as Pt was feeding herself yesterday with  RUE. RUE Coordination: decreased fine motor;decreased gross motor LUE Deficits / Details: L humerus fx, non-operative,  LUE in splint/sling. sling adjusted to support the wrist as well for comfort. no movement noted in the left hand. residual weakness from prior stroke. no pain reported. LUE: Shoulder pain with ROM LUE Coordination: decreased fine motor;decreased gross motor   Lower Extremity Assessment Lower Extremity Assessment: Defer to PT evaluation       Communication Communication Communication: Impaired Factors Affecting Communication: Reduced clarity of speech;Difficulty expressing self   Cognition Arousal: Lethargic Behavior During Therapy: WFL for tasks assessed/performed Cognition: No family/caregiver present to determine baseline                               Following commands: Impaired Following commands impaired: Follows one step commands inconsistently     Cueing  General Comments   Cueing Techniques: Verbal cues;Tactile cues      Exercises     Shoulder Instructions      Home Living Family/patient expects to be discharged to:: Private residence Living Arrangements: Children Available Help at Discharge: Family Type of Home: House Home Access: Ramped entrance     Home Layout: One level     Bathroom Shower/Tub: Tub/shower unit         Home Equipment: BSC/3in1;Other (comment);Hospital bed;Tub bench;Grab bars - tub/shower;Hand held shower head;Grab bars - toilet;Cane - single point   Additional Comments: most of this information is from a prior admission      Prior Functioning/Environment Prior Level of Function : Needs assist;Patient poor historian/Family not available             Mobility Comments: patient reports she needs help for pivot transfer to wheelchair ADLs Comments: assistance required, unclear    OT Problem List: Decreased strength;Decreased range of motion;Decreased activity tolerance;Impaired balance (sitting  and/or standing);Decreased coordination;Decreased cognition;Decreased safety awareness;Impaired UE functional use;Pain   OT Treatment/Interventions: Self-care/ADL training;Therapeutic exercise;Energy conservation;DME and/or AE instruction;Manual therapy;Therapeutic activities;Patient/family education;Balance training      OT Goals(Current goals can be found in the care plan section)   Acute Rehab OT Goals Patient Stated Goal: not able to participate in goal setting OT Goal Formulation: Patient unable to participate in goal setting Time For Goal Achievement: 02/23/24 Potential to Achieve Goals: Fair   OT Frequency:  Min 1X/week    Co-evaluation              AM-PAC OT "6 Clicks" Daily Activity     Outcome Measure Help from another person eating meals?: Total Help from another person taking care of personal grooming?: Total Help from another person toileting, which includes using toliet, bedpan, or urinal?: Total Help from another person bathing (including washing, rinsing, drying)?: Total Help from another person to put on and taking off regular upper body clothing?: Total Help from another person to  put on and taking off regular lower body clothing?: Total 6 Click Score: 6   End of Session Nurse Communication: Mobility status  Activity Tolerance: Patient limited by lethargy;Patient limited by pain Patient left: in bed;with call bell/phone within reach;with bed alarm set  OT Visit Diagnosis: Unsteadiness on feet (R26.81);Other abnormalities of gait and mobility (R26.89);Muscle weakness (generalized) (M62.81);Pain;Hemiplegia and hemiparesis;Other symptoms and signs involving cognitive function Hemiplegia - Right/Left: Left Pain - Right/Left: Left Pain - part of body: Arm                Time: 1040-1100 OT Time Calculation (min): 20 min Charges:  OT General Charges $OT Visit: 1 Visit OT Evaluation $OT Eval Moderate Complexity: 1 [address] OTR/L   Alexis Goodell 02/09/2024, 1:16 PM

## 2024-02-09 NOTE — TOC CM/SW Note (Signed)
    Durable Medical Equipment  (From admission, onward)           Start     Ordered   02/09/24 1105  For home use only DME Hospital bed  Once       Comments: Please call son Dealie Koelzer [phone]  Question Answer Comment  Length of Need Lifetime   Patient has (list medical condition): Acute encephalopathy: Metabolic etiology likely from UTI. CVA   The above medical condition requires: Patient requires the ability to reposition frequently   Head must be elevated greater than: 45 degrees   Bed type Semi-electric   Support Surface: Gel Overlay      02/09/24 1105

## 2024-02-09 NOTE — Progress Notes (Signed)
 EEG complete - results pending

## 2024-02-09 NOTE — Patient Care Conference (Signed)
 Tried calling pt's son, [name], a second time at number listed to give update. Again, phone rang, but no answer.

## 2024-02-09 NOTE — Patient Care Conference (Signed)
 Tried calling patient's son, [name], at number listed to give update. Phone rang but no answer

## 2024-02-10 DIAGNOSIS — G9340 Encephalopathy, unspecified: Secondary | ICD-10-CM | POA: Diagnosis not present

## 2024-02-10 DIAGNOSIS — R410 Disorientation, unspecified: Secondary | ICD-10-CM | POA: Diagnosis not present

## 2024-02-10 LAB — GLUCOSE, CAPILLARY: Glucose-Capillary: 121 mg/dL — ABNORMAL HIGH (ref 70–99)

## 2024-02-10 MED ORDER — HEPARIN SODIUM (PORCINE) 1000 UNIT/ML DIALYSIS
20.0000 [IU]/kg | INTRAMUSCULAR | Status: DC | PRN
  Administered 2024-02-10: 1200 [IU] via INTRAVENOUS_CENTRAL
  Filled 2024-02-10: qty 2

## 2024-02-10 MED ORDER — LIDOCAINE HCL (PF) 1 % IJ SOLN: 5.0000 mL | INTRAMUSCULAR | Status: DC | PRN

## 2024-02-10 MED ORDER — PENTAFLUOROPROP-TETRAFLUOROETH EX AERO: 1.0000 | INHALATION_SPRAY | CUTANEOUS | Status: DC | PRN

## 2024-02-10 MED ORDER — NEPRO/CARBSTEADY PO LIQD: 237.0000 mL | ORAL | Status: DC | PRN

## 2024-02-10 MED ORDER — ONDANSETRON HCL 4 MG/2ML IJ SOLN
4.0000 mg | Freq: Four times a day (QID) | INTRAMUSCULAR | Status: DC | PRN
  Administered 2024-02-11: 4 mg via INTRAVENOUS
  Filled 2024-02-10: qty 2

## 2024-02-10 MED ORDER — HEPARIN SODIUM (PORCINE) 1000 UNIT/ML DIALYSIS: 1000.0000 [IU] | INTRAMUSCULAR | Status: DC | PRN

## 2024-02-10 MED ORDER — CINACALCET HCL 30 MG PO TABS
30.0000 mg | ORAL_TABLET | ORAL | Status: DC
  Administered 2024-02-13: 30 mg via ORAL
  Filled 2024-02-10: qty 1

## 2024-02-10 MED ORDER — LIDOCAINE-PRILOCAINE 2.5-2.5 % EX CREA: 1.0000 | TOPICAL_CREAM | CUTANEOUS | Status: DC | PRN

## 2024-02-10 MED ORDER — ANTICOAGULANT SODIUM CITRATE 4% (200MG/5ML) IV SOLN: 5.0000 mL | Status: DC | PRN

## 2024-02-10 NOTE — Progress Notes (Signed)
 [facility] Progress Note   Subjective:   Seen in room and then later on HD - doing ok. Not really interactive, but seems comfortable. Will speak few word answers only.  Objective Vitals:   02/10/24 1224 02/10/24 1233 02/10/24 1300 02/10/24 1330  BP: (!) 151/74 (!) 158/79 (!) 162/76 (!) 149/71  Pulse: 96 86 92 94  Resp: (!) 21 18 (!) 27 (!) 22  Temp: 99 F (37.2 C)     TempSrc: Oral     SpO2: 97% 96% 99% 99%  Weight:      Height:       Physical Exam General: Elderly woman, NAD. Heart: RRR; no murmur Lungs: CTA anteriorly Abdomen: soft Extremities: no LE edema, LUE bandaged Dialysis Access: R AVG +t/b  Additional Objective Labs: Basic Metabolic Panel: Recent Labs  Lab 02/07/24 0041 02/08/24 0647  NA 142 141  K 5.3* 3.9  CL 98 101  CO2 30 26  GLUCOSE 136* 158*  BUN 45* 58*  CREATININE 9.22* 10.90*  CALCIUM 11.1* 9.8   Liver Function Tests: Recent Labs  Lab 02/07/24 0041 02/08/24 0647  AST 50* 18  ALT 22 14  ALKPHOS 49 53  BILITOT 1.4* 0.5  PROT 6.5 6.5  ALBUMIN 3.1* 2.9*   CBC: Recent Labs  Lab 02/07/24 0041 02/08/24 0647  WBC 10.4 12.0*  HGB 7.2* 8.3*  HCT 22.1* 24.9*  MCV 101.8* 98.4  PLT 129* 217   Studies/Results: EEG adult Result Date: 02/09/2024 Kirsten Quest, MD     02/09/2024  6:34 PM Patient Name: Kirsten Mcmahon MRN: [id number] Epilepsy Attending: Charlsie Mcmahon Referring Physician/Provider: Jerald Kief, MD Date: 02/09/2024 Duration: 25.18 mins Patient history: 78yo F with ams. EEG to evaluate for seizure Level of alertness: Awake AEDs during EEG study: LCM Technical aspects: This EEG study was done with scalp electrodes positioned according to the 10-20 International system of electrode placement. Electrical activity was reviewed with band pass filter of 1-70Hz , sensitivity of 7 uV/mm, display speed of 57mm/sec with a 60Hz  notched filter applied as appropriate. EEG data were recorded continuously and digitally stored.   Video monitoring was available and reviewed as appropriate. Description: EEG showed continuous generalized 3 to 6 Hz theta-delta slowing. There are also sharply contoured waves in the right centro-parietal region consistent with underlying breach artifact. Spikes were noted in right centro-parietal region. Hyperventilation and photic stimulation were not performed.   ABNORMALITY -Spike,right centro-parietal region -Breach artifact, right centro-parietal region -Continuous slow, generalized IMPRESSION: This study is consistent with patient's history of epilepsy arising from centro-parietal region. Additionally there is evidence of cortical dysfunction in right centro-parietal region consistent with prior craniotomy and underlying stroke. Lastly there is also moderate degree of encephalopathy.  No seizures were seen throughout the recording.  Kirsten Mcmahon  Medications:  anticoagulant sodium citrate     cefTRIAXone (ROCEPHIN)  IV 1 g (02/10/24 0445)    anastrozole  1 mg Oral Daily   aspirin EC  81 mg Oral Daily   Chlorhexidine Gluconate Cloth  6 each Topical Q0600   docusate sodium  100 mg Oral BID   donepezil  10 mg Oral QHS   heparin  5,000 Units Subcutaneous Q8H   lacosamide  150 mg Oral BID   levothyroxine  112 mcg Oral QAC breakfast    Dialysis Orders MWF G-O  3h  B350   62.5kg  2K bath R AVG   heparin 2000 - calcitriol 1.5 mcg po three times  per week - sensipar 30mg  three times per week - last Hb 8.2, not on esa     Assessment/Plan: Acute encephalopathy: Baseline dementia. Agitated/confused on admit, calm today. On abx for UTI.  E. Coli UTI - on Rocephin  L humerus fracture - new this admit, non-operative management planned. ESRD - on HD MWF. HD now. HTN/volume: BP high, continue home meds. No LE edema, CXR clear. UFG as tolerates. Anemia of ESRD: Not on ESA d/t breast cancer. Transfuse if <7.  Secondary hyperparathyroidism: CorrCa high - VDRA on hold, will resume  sensipar. Dementia H/o CVA - w/ L sided weakness   Ozzie Hoyle, PA-C 02/10/2024, 2:05 PM  [employer]

## 2024-02-10 NOTE — Plan of Care (Signed)
  Problem: Pain Managment: Goal: General experience of comfort will improve and/or be controlled Outcome: Progressing

## 2024-02-10 NOTE — Progress Notes (Signed)
 OT Cancellation Note  Patient Details Name: Kirsten Mcmahon MRN: [id number] DOB: [birth year]   Cancelled Treatment:    Reason Eval/Treat Not Completed: Patient at procedure or test/ unavailable.Pt at HD and will not be available for family training previously scheduled at 2:00 today. Family training has been rescheduled for tomorrow after 11:00 when the son arrives per family request    Ivor Messier, OT  Acute Rehabilitation Services Office [phone] Secure chat preferred   [name] 02/10/2024, 2:11 PM

## 2024-02-10 NOTE — Care Management Important Message (Signed)
 Important Message  Patient Details  Name: Kirsten Mcmahon MRN: [id number] Date of Birth: 18-Jul-1946   Important Message Given:  Yes - Medicare IM     Sherilyn Banker 02/10/2024, 12:46 PM

## 2024-02-10 NOTE — Progress Notes (Signed)
 Received patient in bed to unit.  Alert and oriented.  Informed consent signed and in chart.   TX duration:3.5 hours  Patient tolerated well.  Transported back to the room  Alert, without acute distress.  Hand-off given to patient's nurse.   Access used: Right Graft Upper ARm Access issues: none  Total UF removed: 2L Medication(s) given: none   02/10/24 1612  Vitals  Temp 99 F (37.2 C)  Temp Source Axillary  BP 136/78  MAP (mmHg) 93  BP Location Left Leg  BP Method Automatic  Patient Position (if appropriate) Lying  ECG Heart Rate (!) 108  Resp (!) 24  Oxygen Therapy  SpO2 99 %  O2 Device Room Air  During Treatment Monitoring  Duration of HD Treatment -hour(s) 3.5 hour(s)  HD Safety Checks Performed Yes  Intra-Hemodialysis Comments Tx completed  Dialysis Fluid Bolus Normal Saline  Bolus Amount (mL) 300 mL  Post Treatment  Dialyzer Clearance Lightly streaked  Liters Processed 84  Fluid Removed (mL) 2000 mL  Tolerated HD Treatment Yes  AVG/AVF Arterial Site Held (minutes) 7 minutes  AVG/AVF Venous Site Held (minutes) 7 minutes  Fistula / Graft Right Upper arm Arteriovenous vein graft  Placement Date/Time: 09/29/21 1023   Placed prior to admission: No  Orientation: Right  Access Location: Upper arm  Access Type: Arteriovenous vein graft  Status Accessed;Deaccessed     Stacie Glaze LPN Kidney Dialysis Unit

## 2024-02-10 NOTE — Progress Notes (Signed)
 Progress Note   Patient: Kirsten Mcmahon:[id number] DOB: 1946/04/29 DOA: 02/06/2024     3 DOS: the patient was seen and examined on 02/10/2024   Brief hospital course: 78 y.o. female with medical history significant of end-stage renal disease on hemodialysis MWF, history of stroke in 1998 with residual left-sided weakness, dementia, presented history of seizure disorder, chronic anemia, hypertension, hypothyroidism, non-insulin-dependent diabetes mellitus and history of breast cancer (ER/PR/HER2-)status post radical mastectomy in 2024, on adjuvant therapy with anastrozole   She was brought in earlier in the day by son on account of altered mental status from baseline.  Patient was reported to be agitated on arrival   Assessment and Plan: Acute encephalopathy:  -Metabolic etiology likely from UTI.   -CT scan shows no acute abnormality. -Cont with supportive care. Pt tolerating rocephin -Seems to be more alert and interactive today   Dementia:  -With increased delirium and behavioral abnormality.   -Continue agitation protocol as instituted in the ED -Cont with supportive care   History of seizure disorder:  -Patient is on Vimpat on days of dialysis.  -Followed by Neurology as outpatient, last seen on 12/24  -continued home meds -Seizure precautions in place with IV Ativan as needed for seizures. -reviewed EEG. No seizures noted   End-stage renal disease:  -Nephrology following for HD.  -Cont HD as per Nephrology  Urinary tract infection:  -UA is suggestive of UTI in the setting of increased mental status change with leukocytosis -urine cx pending, thus far pos for >100,000 pan-sensitive ecoli -cont with empiric rocephin    Closed left humeral fracture: -Acutely displaced distal humerus shaft fracture whilst patient was being cared for by nurses.  Likely pathologic fracture from osteoporosis.   -Orthopedics consulted. Recs to cont NWB LUE in splint, recs for non-operative  management -f/u with PT/OT recs -Ortho to f/u in 2 weeks   Acute on chronic anemia:  -Patient looks euvolemic at this time.   -Pt was transfused 1 unit of PRBC.  Baseline hemoglobin of ~10, target hemoglobin greater than 8.    History of CVA with residual left-sided weakness: CT scan without any acute intracranial moiety.  Chronic ischemic changes were noted.   Hypercalcemia: Hold calcitriol.  Will send ionized calcium levels.  Tumor lysis syndrome less likely.   Essential hypertension:  -BP stable at present -Per chart review, pt's son is not giving metoprolol out of fear of hypotension, especially while on HD   History of breast cancer status post radical mastectomy.  Currently on adjuvant chemotherapy.   Hypothyroidism:: Continued with Synthroid.       Subjective: Feeling better today, per pt  Physical Exam: Vitals:   02/10/24 1600 02/10/24 1612 02/10/24 1616 02/10/24 1620  BP: 127/62 136/78 129/78   Pulse: (!) 114  (!) 119   Resp: (!) 27 (!) 24 17   Temp:  99 F (37.2 C)    TempSrc:  Axillary    SpO2: 100% 99% 98%   Weight:    60 kg  Height:       General exam: Awake, laying in bed, in nad Respiratory system: Normal respiratory effort, no wheezing Cardiovascular system: regular rate, s1, s2 Gastrointestinal system: Soft, nondistended, positive BS Central nervous system: CN2-12 grossly intact, residual L sided weakness Extremities: Perfused, no clubbing Skin: Normal skin turgor, no notable skin lesions seen Psychiatry: Mood normal // no visual hallucinations   Data Reviewed:  There are no new results to review at this time.  Family Communication:  Pt in room, pt's currently not at bedside  Disposition: Status is: Inpatient Remains inpatient appropriate because: severity of illness  Planned Discharge Destination: Skilled nursing facility    Author: Rickey Barbara, MD 02/10/2024 6:02 PM  For on call review [email or web address].

## 2024-02-10 NOTE — Progress Notes (Signed)
 PT Cancellation Note  Patient Details Name: Kirsten Mcmahon MRN: [id number] DOB: 07/25/1946   Cancelled Treatment:    Reason Eval/Treat Not Completed: Patient at procedure or test/unavailable;Other (comment) (Patient is just now leaving the room for hemodialysis and will not be available for family training previously scheduled at 2:00 today. Family training has been rescheduled for tomorrow after 11:00 when the son arrives per family request.)  Donna Bernard, PT, MPT  [facility] 02/10/2024, 12:45 PM

## 2024-02-10 NOTE — Plan of Care (Signed)
  Problem: Nutrition: Goal: Adequate nutrition will be maintained Outcome: Progressing   Problem: Pain Managment: Goal: General experience of comfort will improve and/or be controlled Outcome: Progressing   Problem: Skin Integrity: Goal: Risk for impaired skin integrity will decrease Outcome: Progressing

## 2024-02-11 DIAGNOSIS — G9340 Encephalopathy, unspecified: Secondary | ICD-10-CM | POA: Diagnosis not present

## 2024-02-11 DIAGNOSIS — R410 Disorientation, unspecified: Secondary | ICD-10-CM | POA: Diagnosis not present

## 2024-02-11 MED ORDER — HYDROMORPHONE HCL 2 MG PO TABS
1.0000 mg | ORAL_TABLET | ORAL | Status: DC | PRN
  Administered 2024-02-12: 1 mg via ORAL
  Filled 2024-02-11: qty 1

## 2024-02-11 NOTE — Progress Notes (Signed)
 Physical Therapy Treatment Patient Details Name: Kirsten Mcmahon MRN: [id number] DOB: November 16, 1946 Today's Date: 02/11/2024   History of Present Illness Patient is a 78 year old female presenting with altered mental status. Found to have acute fracture of the L humerus, treated non-operatively with padded splint and sling. History of  dementia,ESRD, CVA with left residual weakness, seizure disorder.    PT Comments  Pt supine in bed on arrival.  She is restless and voiced wanting OOB to get to the recliner. Pt attempted transfer in sara stedy but unable to follow commands to initiate movement this session.  She did follow commands to reach for railing on sara stedy which improved her sitting tolerance edge of bed.  Pt was more receptive to face to face transfer from bed to recliner, required mod assistance.  This technique may be more of what she was doing at home but no family in room to verify.      If plan is discharge home, recommend the following: A lot of help with walking and/or transfers;A lot of help with bathing/dressing/bathroom;Assist for transportation;Help with stairs or ramp for entrance;Supervision due to cognitive status;Assistance with cooking/housework;Direct supervision/assist for medications management;Direct supervision/assist for financial management   Can travel by private vehicle     No  Equipment Recommendations  None recommended by PT    Recommendations for Other Services       Precautions / Restrictions Precautions Precautions: Fall Recall of Precautions/Restrictions: Impaired Required Braces or Orthoses: Sling Splint/Cast: padded splint applied by MD Restrictions Weight Bearing Restrictions Per Provider Order: Yes LUE Weight Bearing Per Provider Order: Non weight bearing     Mobility  Bed Mobility Overal bed mobility: Needs Assistance       Supine to sit: Mod assist     General bed mobility comments: Pt more receptive to mobility this session as she  is motivated to get out of the bed and voiced wanting to get in the recliner.  Pt required assistance to advance LEs and elevate trunk into seated position.  She presents with R lateral bending.  So bed trended to even out sitting balance.  In this position she was able to sit edge of bed unsupported for 4-5 mins.    Transfers Overall transfer level: Needs assistance Equipment used: Ambulation equipment used, None (attempted sara sit to stand but she was unable to follow commands to engage in sit to stand transfers in this position.  Performed face to face transfer with gt belt and she responded better to this technique.) Transfers: Bed to chair/wheelchair/BSC   Stand pivot transfers: Mod assist, From elevated surface         General transfer comment: Cues for forward weight shifting in face to face position with mod assistance to rise into standing and pivot in recliner.  Pt unable to achieve full upright stance due to posturing but this is likely her baseline. Transfer via Lift Equipment: Stedy (unable to follow commands, she would hold the bear but no initiation to rise into standing when attempting.)  Ambulation/Gait                   Stairs             Wheelchair Mobility     Tilt Bed    Modified Rankin (Stroke Patients Only)       Balance Overall balance assessment: Needs assistance Sitting-balance support: Feet supported Sitting balance-[name] Scale: Fair Sitting balance - Comments: able to sit unsupported this session, bed  tilted to assist. Postural control: Right lateral lean   Standing balance-[name] Scale: Poor Standing balance comment: reliant on external support.                            Communication Communication Communication: Impaired Factors Affecting Communication: Reduced clarity of speech;Difficulty expressing self  Cognition Arousal: Alert Behavior During Therapy: WFL for tasks assessed/performed   PT - Cognitive  impairments: No family/caregiver present to determine baseline, Sequencing, Orientation   Orientation impairments: Place, Time, Situation                   PT - Cognition Comments: patient is cooperative throughout session but wants out of bed and wants me to help her sister shelby or shelly. Following commands: Impaired Following commands impaired: Follows one step commands inconsistently    Cueing Cueing Techniques: Verbal cues, Tactile cues  Exercises      General Comments        Pertinent Vitals/Pain Pain Assessment Pain Assessment: Faces Faces Pain Scale: Hurts little more Pain Location: LUE, with movement when readjusting arm in sling. Pain Descriptors / Indicators: Aching Pain Intervention(s): Monitored during session, Repositioned    Home Living                          Prior Function            PT Goals (current goals can now be found in the care plan section) Acute Rehab PT Goals Patient Stated Goal: To get to recliner today, Potential to Achieve Goals: Fair Progress towards PT goals: Progressing toward goals    Frequency    Min 2X/week      PT Plan      Co-evaluation              AM-PAC PT "6 Clicks" Mobility   Outcome Measure  Help needed turning from your back to your side while in a flat bed without using bedrails?: A Lot Help needed moving from lying on your back to sitting on the side of a flat bed without using bedrails?: A Lot Help needed moving to and from a bed to a chair (including a wheelchair)?: A Lot Help needed standing up from a chair using your arms (e.g., wheelchair or bedside chair)?: Total Help needed to walk in hospital room?: Total Help needed climbing 3-5 steps with a railing? : Total 6 Click Score: 9    End of Session Equipment Utilized During Treatment: Gait belt Activity Tolerance: Patient tolerated treatment well Patient left: with call bell/phone within reach;in chair;with chair alarm  set Nurse Communication: Mobility status (informed nurse tech of sling in recliner for back to bed transfer.) PT Visit Diagnosis: Muscle weakness (generalized) (M62.81);Unsteadiness on feet (R26.81)     Time: 8413-2440 PT Time Calculation (min) (ACUTE ONLY): 27 min  Charges:    $Therapeutic Activity: 23-37 mins PT General Charges $$ ACUTE PT VISIT: 1 Visit                     Bonney Leitz , PTA Acute Rehabilitation Services Office [phone]    Terrion Poblano Artis Delay 02/11/2024, 1:41 PM

## 2024-02-11 NOTE — Progress Notes (Signed)
 Occupational Therapy Treatment Patient Details Name: Kirsten Mcmahon MRN: [id number] DOB: 1946-06-13 Today's Date: 02/11/2024   History of present illness Patient is a 78 year old female presenting with altered mental status. Found to have acute fracture of the L humerus, treated non-operatively with padded splint and sling. History of  dementia,ESRD, CVA with left residual weakness, seizure disorder.   OT comments  Pt continues to need significant assist for ADLs, was able to sit EOB with min A to CGA today to work on dynamic sitting activities. Pt able to reach objects in short distance and return them with good trunk control needing mostly CGA. Contacted pt's son regarding education session today, notified him that handouts were left regarding sling and shoulder as he reports he is comfortable with transferring her. OT to continue to progress pt as able, DC plans appropriate for [facility].       If plan is discharge home, recommend the following:  Two people to help with walking and/or transfers;Two people to help with bathing/dressing/bathroom;Assistance with cooking/housework;Assistance with feeding;Direct supervision/assist for medications management;Assist for transportation;Help with stairs or ramp for entrance;Supervision due to cognitive status   Equipment Recommendations  Hospital bed    Recommendations for Other Services      Precautions / Restrictions Precautions Precautions: Fall Recall of Precautions/Restrictions: Impaired Required Braces or Orthoses: Sling Splint/Cast: padded splint applied by MD Restrictions Weight Bearing Restrictions Per Provider Order: Yes LUE Weight Bearing Per Provider Order: Non weight bearing       Mobility Bed Mobility Overal bed mobility: Needs Assistance Bed Mobility: Supine to Sit, Sit to Supine     Supine to sit: Max assist, +2 for safety/equipment, +2 for physical assistance Sit to supine: Max assist, +2 for safety/equipment, +2 for  physical assistance   General bed mobility comments: helicopter method used to assist pt to EOB and back to limit use of LUE.    Transfers                   General transfer comment: Max A +2 with pad for lateral scooting EOB     Balance Overall balance assessment: Needs assistance Sitting-balance support: Feet supported Sitting balance-[name] Scale: Fair Sitting balance - Comments: pt sitting EOB with Min A to CGA Postural control: Right lateral lean                                 ADL either performed or assessed with clinical judgement   ADL       Grooming: Contact guard assist;Sitting;Wash/dry face Grooming Details (indicate cue type and reason): using her RUE                               General ADL Comments: Focused session on sitting balance, Contacted pt's son regarding the scheduled caregiver training but he reports not being able to make it. Left handouts for pt son regarding sling, shoulder positioning, and AAROM of uninvolved joints-he is aware that the handouts will be in room    Extremity/Trunk Assessment              Vision   Additional Comments: R gaze preference with cervical rotation. Unsure of full L inattention as she sounds aware of her L shoulder and acknowledges her limitations with it   Perception     Praxis     Communication Communication Communication: Impaired  Factors Affecting Communication: Reduced clarity of speech;Difficulty expressing self   Cognition Arousal: Alert Behavior During Therapy: WFL for tasks assessed/performed Cognition: No family/caregiver present to determine baseline                               Following commands: Impaired Following commands impaired: Follows one step commands inconsistently, Follows one step commands with increased time      Cueing   Cueing Techniques: Verbal cues, Tactile cues  Exercises Other Exercises Other Exercises: dynamic reaching  sitting EOB Other Exercises: cervical rotation stretch with mild lateral flexion    Shoulder Instructions       General Comments Repositioning of neck with the use of pillows for a more midline position.    Pertinent Vitals/ Pain       Pain Assessment Pain Assessment: Faces Faces Pain Scale: Hurts little more Pain Location: LUE with bed repositioning Pain Descriptors / Indicators: Aching Pain Intervention(s): Monitored during session, Limited activity within patient's tolerance, Repositioned  Home Living                                          Prior Functioning/Environment              Frequency  Min 1X/week        Progress Toward Goals  OT Goals(current goals can now be found in the care plan section)  Progress towards OT goals: Not progressing toward goals - comment  Acute Rehab OT Goals OT Goal Formulation: Patient unable to participate in goal setting Time For Goal Achievement: 02/23/24 Potential to Achieve Goals: Fair  Plan      Co-evaluation                 AM-PAC OT "6 Clicks" Daily Activity     Outcome Measure   Help from another person eating meals?: Total Help from another person taking care of personal grooming?: A Little (with RUE and cueing) Help from another person toileting, which includes using toliet, bedpan, or urinal?: Total Help from another person bathing (including washing, rinsing, drying)?: Total Help from another person to put on and taking off regular upper body clothing?: Total Help from another person to put on and taking off regular lower body clothing?: Total 6 Click Score: 8    End of Session    OT Visit Diagnosis: Unsteadiness on feet (R26.81);Other abnormalities of gait and mobility (R26.89);Muscle weakness (generalized) (M62.81);Pain;Hemiplegia and hemiparesis;Other symptoms and signs involving cognitive function Hemiplegia - Right/Left: Left Pain - Right/Left: Left Pain - part of body: Arm    Activity Tolerance Patient tolerated treatment well   Patient Left in bed;with call bell/phone within reach;with bed alarm set   Nurse Communication Mobility status        Time: 1027-2536 OT Time Calculation (min): 22 min  Charges: OT General Charges $OT Visit: 1 Visit OT Treatments $Therapeutic Activity: 8-22 mins  02/11/2024  AB, OTR/L  Acute Rehabilitation Services  Office: [phone]   Tristan Schroeder 02/11/2024, 2:44 PM

## 2024-02-11 NOTE — Progress Notes (Signed)
 Progress Note   Patient: Kirsten Mcmahon:[id number] DOB: 1945-12-16 DOA: 02/06/2024     4 DOS: the patient was seen and examined on 02/11/2024   Brief hospital course: 78 y.o. female with medical history significant of end-stage renal disease on hemodialysis MWF, history of stroke in 1998 with residual left-sided weakness, dementia, presented history of seizure disorder, chronic anemia, hypertension, hypothyroidism, non-insulin-dependent diabetes mellitus and history of breast cancer (ER/PR/HER2-)status post radical mastectomy in 2024, on adjuvant therapy with anastrozole   She was brought in earlier in the day by son on account of altered mental status from baseline.  Patient was reported to be agitated on arrival   Assessment and Plan: Acute encephalopathy:  -Metabolic etiology likely from UTI.   -CT scan shows no acute abnormality. -Cont with supportive care. Pt tolerating rocephin -Becoming more alert and interactive. Improving   Dementia:  -With increased delirium and behavioral abnormality.   -Continue agitation protocol as instituted in the ED -Cont with supportive care   History of seizure disorder:  -Patient is on Vimpat on days of dialysis.  -Followed by Neurology as outpatient, last seen on 12/24  -continued home meds -Seizure precautions in place with IV Ativan as needed for seizures. -reviewed EEG. No seizures noted   End-stage renal disease:  -Nephrology following for HD.  -Cont HD as per Nephrology  Urinary tract infection:  -UA is suggestive of UTI in the setting of increased mental status change with leukocytosis -urine cx pending, thus far pos for >100,000 pan-sensitive ecoli -Pt had been continued with empiric rocephin    Closed left humeral fracture: -Acutely displaced distal humerus shaft fracture whilst patient was being cared for by nurses.  Likely pathologic fracture from osteoporosis.   -Orthopedics consulted. Recs to cont NWB LUE in splint, recs for  non-operative management -f/u with PT/OT recs -Ortho to f/u in 2 weeks   Acute on chronic anemia:  -Patient looks euvolemic at this time.   -Pt was transfused 1 unit of PRBC.  Baseline hemoglobin of ~10, target hemoglobin greater than 8.    History of CVA with residual left-sided weakness: CT scan without any acute intracranial moiety.  Chronic ischemic changes were noted.   Hypercalcemia: Hold calcitriol.  Will send ionized calcium levels.  Tumor lysis syndrome less likely.   Essential hypertension:  -BP stable at present -Per chart review, pt's son is not giving metoprolol out of fear of hypotension, especially while on HD   History of breast cancer status post radical mastectomy.  Currently on adjuvant chemotherapy.   Hypothyroidism:: Continued with Synthroid.       Subjective: Complains of continued pains, is improving though  Physical Exam: Vitals:   02/10/24 1821 02/10/24 2008 02/11/24 0433 02/11/24 0846  BP: 133/76 (!) 143/66 123/68 (!) 140/70  Pulse: 97 99 99 96  Resp: 16 17 16 16   Temp: 98.2 F (36.8 C) 98.6 F (37 C) 98.5 F (36.9 C) 97.6 F (36.4 C)  TempSrc:   Oral   SpO2: 100% 97% 97% 100%  Weight:      Height:       General exam: Conversant, in no acute distress Respiratory system: normal chest rise, clear, no audible wheezing Cardiovascular system: regular rhythm, s1-s2 Gastrointestinal system: Nondistended, nontender, pos BS Central nervous system: No seizures, no tremors Extremities: No cyanosis, no joint deformities Skin: No rashes, no pallor Psychiatry: Affect normal // no auditory hallucinations   Data Reviewed:  There are no new results to review at this  time.  Family Communication: Pt in room, pt's currently not at bedside  Disposition: Status is: Inpatient Remains inpatient appropriate because: severity of illness  Planned Discharge Destination: Skilled nursing facility    Author: Rickey Barbara, MD 02/11/2024 3:12 PM  For on call  review [email or web address].

## 2024-02-11 NOTE — Progress Notes (Signed)
 [facility] Progress Note   Subjective:   Seen in room - more awake/alert this AM. Answered a few questions and I helped position her pillow. S/p HD yesterday - did fine, 2L removed.  Objective Vitals:   02/10/24 1821 02/10/24 2008 02/11/24 0433 02/11/24 0846  BP: 133/76 (!) 143/66 123/68 (!) 140/70  Pulse: 97 99 99 96  Resp: 16 17 16 16   Temp: 98.2 F (36.8 C) 98.6 F (37 C) 98.5 F (36.9 C) 97.6 F (36.4 C)  TempSrc:   Oral   SpO2: 100% 97% 97% 100%  Weight:      Height:       Physical Exam General: Elderly woman, NAD. Heart: RRR; no murmur Lungs: CTA anteriorly Abdomen: soft Extremities: no LE edema, LUE bandaged Dialysis Access: R AVG +t/b  Additional Objective Labs: Basic Metabolic Panel: Recent Labs  Lab 02/07/24 0041 02/08/24 0647  NA 142 141  K 5.3* 3.9  CL 98 101  CO2 30 26  GLUCOSE 136* 158*  BUN 45* 58*  CREATININE 9.22* 10.90*  CALCIUM 11.1* 9.8   Liver Function Tests: Recent Labs  Lab 02/07/24 0041 02/08/24 0647  AST 50* 18  ALT 22 14  ALKPHOS 49 53  BILITOT 1.4* 0.5  PROT 6.5 6.5  ALBUMIN 3.1* 2.9*   CBC: Recent Labs  Lab 02/07/24 0041 02/08/24 0647  WBC 10.4 12.0*  HGB 7.2* 8.3*  HCT 22.1* 24.9*  MCV 101.8* 98.4  PLT 129* 217   Blood Culture    Component Value Date/Time   SDES BLOOD LEFT HAND 02/10/2024 0705   SPECREQUEST  02/10/2024 0705    BOTTLES DRAWN AEROBIC AND ANAEROBIC Blood Culture adequate volume   CULT  02/10/2024 0705    NO GROWTH 1 DAY Performed at [facility], [address] [place], [place] [address]    REPTSTATUS PENDING 02/10/2024 6045   Studies/Results: EEG adult Result Date: 02/09/2024 Kirsten Quest, MD     02/09/2024  6:34 PM Patient Name: Kirsten Mcmahon MRN: [id number] Epilepsy Attending: Charlsie Mcmahon Referring Physician/Provider: Jerald Kief, MD Date: 02/09/2024 Duration: 25.18 mins Patient history: 78yo F with ams. EEG to evaluate for seizure Level of alertness:  Awake AEDs during EEG study: LCM Technical aspects: This EEG study was done with scalp electrodes positioned according to the 10-20 International system of electrode placement. Electrical activity was reviewed with band pass filter of 1-70Hz , sensitivity of 7 uV/mm, display speed of 64mm/sec with a 60Hz  notched filter applied as appropriate. EEG data were recorded continuously and digitally stored.  Video monitoring was available and reviewed as appropriate. Description: EEG showed continuous generalized 3 to 6 Hz theta-delta slowing. There are also sharply contoured waves in the right centro-parietal region consistent with underlying breach artifact. Spikes were noted in right centro-parietal region. Hyperventilation and photic stimulation were not performed.   ABNORMALITY -Spike,right centro-parietal region -Breach artifact, right centro-parietal region -Continuous slow, generalized IMPRESSION: This study is consistent with patient's history of epilepsy arising from centro-parietal region. Additionally there is evidence of cortical dysfunction in right centro-parietal region consistent with prior craniotomy and underlying stroke. Lastly there is also moderate degree of encephalopathy.  No seizures were seen throughout the recording.  Kirsten Mcmahon  Medications:  cefTRIAXone (ROCEPHIN)  IV 1 g (02/11/24 0500)    anastrozole  1 mg Oral Daily   aspirin EC  81 mg Oral Daily   Chlorhexidine Gluconate Cloth  6 each Topical Q0600   [START ON  02/13/2024] cinacalcet  30 mg Oral Q M,W,F-HD   docusate sodium  100 mg Oral BID   donepezil  10 mg Oral QHS   heparin  5,000 Units Subcutaneous Q8H   lacosamide  150 mg Oral BID   levothyroxine  112 mcg Oral QAC breakfast    Dialysis Orders MWF G-O  3h  B350   62.5kg  2K bath R AVG   heparin 2000 - calcitriol 1.5 mcg po three times per week - sensipar 30mg  three times per week - last Hb 8.2, not on esa     Assessment/Plan: Acute encephalopathy:  Baseline dementia. Agitated/confused on admit, calm today. On abx for UTI.  E. Coli UTI - on Rocephin  L humerus fracture - new this admit, non-operative management planned. ESRD - on HD MWF. Next HD Monday. HTN/volume: BP better, continue home meds. No LE edema, CXR clear. Below prior EDW, change on discharge. Anemia of ESRD: Not on ESA d/t breast cancer. Transfuse if <7.  Secondary hyperparathyroidism: CorrCa high - VDRA on hold, sensipar resumed. Dementia H/o CVA - w/ L sided weakness   Ozzie Hoyle, PA-C 02/11/2024, 10:49 AM  [facility]

## 2024-02-11 NOTE — Plan of Care (Signed)
   Problem: Activity: Goal: Risk for activity intolerance will decrease Outcome: Progressing   Problem: Nutrition: Goal: Adequate nutrition will be maintained Outcome: Progressing   Problem: Pain Managment: Goal: General experience of comfort will improve and/or be controlled Outcome: Progressing   Problem: Safety: Goal: Ability to remain free from injury will improve Outcome: Progressing

## 2024-02-12 DIAGNOSIS — G9340 Encephalopathy, unspecified: Secondary | ICD-10-CM | POA: Diagnosis not present

## 2024-02-12 DIAGNOSIS — R410 Disorientation, unspecified: Secondary | ICD-10-CM | POA: Diagnosis not present

## 2024-02-12 MED ORDER — PROSOURCE PLUS PO LIQD
30.0000 mL | Freq: Two times a day (BID) | ORAL | Status: DC
  Administered 2024-02-12 – 2024-02-14 (×2): 30 mL via ORAL
  Filled 2024-02-12 (×4): qty 30

## 2024-02-12 MED ORDER — HYDROMORPHONE HCL 2 MG PO TABS: 1.0000 mg | ORAL_TABLET | Freq: Four times a day (QID) | ORAL | Status: DC | PRN

## 2024-02-12 MED ORDER — FENTANYL CITRATE PF 50 MCG/ML IJ SOSY: 12.5000 ug | PREFILLED_SYRINGE | INTRAMUSCULAR | Status: DC | PRN

## 2024-02-12 NOTE — Progress Notes (Signed)
  [facility] Progress Note   Subjective:  Seen in room - drowsy, but arousable with touch. Didn't answer my questions. Looks fairly comfortable.   Objective Vitals:   02/11/24 1521 02/11/24 2111 02/12/24 0348 02/12/24 0749  BP: (!) 116/59 (!) 122/56 (!) 118/58 (!) 122/51  Pulse: 95 87 84 87  Resp: 16 16 16 17   Temp: 98.4 F (36.9 C) 98.4 F (36.9 C) 98.2 F (36.8 C)   TempSrc: Oral Oral Oral   SpO2: 96% 98% 96% 96%  Weight:      Height:       Physical Exam General: Elderly woman, NAD. Heart: RRR; no murmur Lungs: CTA anteriorly Abdomen: soft Extremities: no LE edema, LUE bandaged Dialysis Access: R AVG +t/b  Additional Objective Labs: Basic Metabolic Panel: Recent Labs  Lab 02/07/24 0041 02/08/24 0647  NA 142 141  K 5.3* 3.9  CL 98 101  CO2 30 26  GLUCOSE 136* 158*  BUN 45* 58*  CREATININE 9.22* 10.90*  CALCIUM 11.1* 9.8   Liver Function Tests: Recent Labs  Lab 02/07/24 0041 02/08/24 0647  AST 50* 18  ALT 22 14  ALKPHOS 49 53  BILITOT 1.4* 0.5  PROT 6.5 6.5  ALBUMIN 3.1* 2.9*   CBC: Recent Labs  Lab 02/07/24 0041 02/08/24 0647  WBC 10.4 12.0*  HGB 7.2* 8.3*  HCT 22.1* 24.9*  MCV 101.8* 98.4  PLT 129* 217   Medications:   anastrozole  1 mg Oral Daily   aspirin EC  81 mg Oral Daily   Chlorhexidine Gluconate Cloth  6 each Topical Q0600   [START ON 02/13/2024] cinacalcet  30 mg Oral Q M,W,F-HD   docusate sodium  100 mg Oral BID   donepezil  10 mg Oral QHS   heparin  5,000 Units Subcutaneous Q8H   lacosamide  150 mg Oral BID   levothyroxine  112 mcg Oral QAC breakfast    Dialysis Orders MWF G-O  3h  B350   62.5kg  2K bath R AVG   heparin 2000 - calcitriol 1.5 mcg po three times per week - sensipar 30mg  three times per week - last Hb 8.2, not on esa     Assessment/Plan: Acute encephalopathy: Baseline dementia. Agitated/confused on admit, calm/drowsy today. On abx for UTI.  E. Coli UTI - on Rocephin  L humerus  fracture - new this admit, non-operative management planned. ESRD - on HD MWF. Next HD Monday. HTN/volume: BP controlled, continue home meds. No LE edema, CXR clear. Below prior EDW, change on discharge. Anemia of ESRD: Hgb 8.3. Not on ESA d/t breast cancer. Transfuse if <7.  Secondary hyperparathyroidism: CorrCa high - VDRA on hold, sensipar resumed. Nutrition: Is she eating? Will add supps if she is willing. Dementia H/o CVA - w/ L sided weakness   Kirsten Hoyle, PA-C 02/12/2024, 12:17 PM  [facility]

## 2024-02-12 NOTE — Progress Notes (Signed)
 Progress Note   Patient: Kirsten Mcmahon NUU:[id number] DOB: December 19, 1945 DOA: 02/06/2024     5 DOS: the patient was seen and examined on 02/12/2024   Brief hospital course: 78 y.o. female with medical history significant of end-stage renal disease on hemodialysis MWF, history of stroke in 1998 with residual left-sided weakness, dementia, presented history of seizure disorder, chronic anemia, hypertension, hypothyroidism, non-insulin-dependent diabetes mellitus and history of breast cancer (ER/PR/HER2-)status post radical mastectomy in 2024, on adjuvant therapy with anastrozole   She was brought in earlier in the day by son on account of altered mental status from baseline.  Patient was reported to be agitated on arrival   Assessment and Plan: Acute encephalopathy:  -Metabolic etiology likely from UTI.   -CT scan shows no acute abnormality. -Cont with supportive care. Pt tolerating rocephin -Becoming more alert and interactive. Improving   Dementia:  -With increased delirium and behavioral abnormality.   -Continue agitation protocol as instituted in the ED -Cont with supportive care   History of seizure disorder:  -Patient is on Vimpat on days of dialysis.  -Followed by Neurology as outpatient, last seen on 12/24  -continued home meds -Seizure precautions in place with IV Ativan as needed for seizures. -reviewed EEG. No seizures noted   End-stage renal disease:  -Nephrology following for HD.  -Cont HD as per Nephrology  Urinary tract infection:  -UA is suggestive of UTI in the setting of increased mental status change with leukocytosis -urine cx pending, thus far pos for >100,000 pan-sensitive ecoli -Pt had been continued with empiric rocephin    Closed left humeral fracture: -Acutely displaced distal humerus shaft fracture whilst patient was being cared for by nurses.  Likely pathologic fracture from osteoporosis.   -Orthopedics consulted. Recs to cont NWB LUE in splint, recs for  non-operative management -f/u with PT/OT recs -Ortho to f/u in 2 weeks -continued pt on PRN oral dilaudid q6h prn   Acute on chronic anemia:  -Patient looks euvolemic at this time.   -Pt was transfused 1 unit of PRBC.  Baseline hemoglobin of ~10, target hemoglobin greater than 8.    History of CVA with residual left-sided weakness: CT scan without any acute intracranial moiety.  Chronic ischemic changes were noted.   Hypercalcemia: Hold calcitriol.  Will send ionized calcium levels.  Tumor lysis syndrome less likely.   Essential hypertension:  -BP stable at present -Per chart review, pt's son is not giving metoprolol out of fear of hypotension, especially while on HD   History of breast cancer status post radical mastectomy.  Currently on adjuvant chemotherapy.   Hypothyroidism:: Continued with Synthroid.       Subjective: Feeling drowsy this AM  Physical Exam: Vitals:   02/11/24 2111 02/12/24 0348 02/12/24 0749 02/12/24 1506  BP: (!) 122/56 (!) 118/58 (!) 122/51 125/60  Pulse: 87 84 87 90  Resp: 16 16 17 17   Temp: 98.4 F (36.9 C) 98.2 F (36.8 C)  98.2 F (36.8 C)  TempSrc: Oral Oral  Oral  SpO2: 98% 96% 96% 100%  Weight:      Height:       General exam: Awake, laying in bed, in nad Respiratory system: Normal respiratory effort, no wheezing Cardiovascular system: regular rate, s1, s2 Gastrointestinal system: Soft, nondistended, positive BS Central nervous system: CN2-12 grossly intact, strength intact Extremities: Perfused, no clubbing Skin: Normal skin turgor, no notable skin lesions seen Psychiatry: Mood normal // no visual hallucinations   Data Reviewed:  There are no  new results to review at this time.  Family Communication: Pt in room, pt's currently not at bedside  Disposition: Status is: Inpatient Remains inpatient appropriate because: severity of illness  Planned Discharge Destination: Skilled nursing facility    Author: Rickey Barbara,  MD 02/12/2024 4:15 PM  For on call review [email or web address].

## 2024-02-12 NOTE — Plan of Care (Signed)
  Problem: Coping: Goal: Level of anxiety will decrease Outcome: Progressing   Problem: Safety: Goal: Ability to remain free from injury will improve Outcome: Progressing   Problem: Clinical Measurements: Goal: Diagnostic test results will improve Outcome: Progressing   Problem: Safety: Goal: Verbalization of understanding the information provided will improve Outcome: Progressing

## 2024-02-12 NOTE — Plan of Care (Signed)
  Problem: Nutrition: Goal: Adequate nutrition will be maintained Outcome: Progressing   Problem: Coping: Goal: Level of anxiety will decrease Outcome: Progressing   Problem: Education: Goal: Knowledge of General Education information will improve Description: Including pain rating scale, medication(s)/side effects and non-pharmacologic comfort measures Outcome: Not Progressing   Problem: Health Behavior/Discharge Planning: Goal: Ability to manage health-related needs will improve Outcome: Not Progressing   Problem: Clinical Measurements: Goal: Ability to maintain clinical measurements within normal limits will improve Outcome: Not Progressing

## 2024-02-13 ENCOUNTER — Other Ambulatory Visit (HOSPITAL_COMMUNITY): Payer: Self-pay

## 2024-02-13 ENCOUNTER — Inpatient Hospital Stay (HOSPITAL_COMMUNITY)

## 2024-02-13 DIAGNOSIS — G9340 Encephalopathy, unspecified: Secondary | ICD-10-CM | POA: Diagnosis not present

## 2024-02-13 DIAGNOSIS — R410 Disorientation, unspecified: Secondary | ICD-10-CM | POA: Diagnosis not present

## 2024-02-13 LAB — COMPREHENSIVE METABOLIC PANEL
ALT: 18 U/L (ref 0–44)
AST: 22 U/L (ref 15–41)
Albumin: 3 g/dL — ABNORMAL LOW (ref 3.5–5.0)
Alkaline Phosphatase: 52 U/L (ref 38–126)
Anion gap: 19 — ABNORMAL HIGH (ref 5–15)
BUN: 51 mg/dL — ABNORMAL HIGH (ref 8–23)
CO2: 25 mmol/L (ref 22–32)
Calcium: 9.1 mg/dL (ref 8.9–10.3)
Chloride: 92 mmol/L — ABNORMAL LOW (ref 98–111)
Creatinine, Ser: 9.59 mg/dL — ABNORMAL HIGH (ref 0.44–1.00)
GFR, Estimated: 4 mL/min — ABNORMAL LOW (ref >60)
Glucose, Bld: 121 mg/dL — ABNORMAL HIGH (ref 70–99)
Potassium: 3.4 mmol/L — ABNORMAL LOW (ref 3.5–5.1)
Sodium: 136 mmol/L (ref 135–145)
Total Bilirubin: 0.6 mg/dL (ref 0.0–1.2)
Total Protein: 7.1 g/dL (ref 6.5–8.1)

## 2024-02-13 LAB — CBC
HCT: 29.9 % — ABNORMAL LOW (ref 36.0–46.0)
Hemoglobin: 9.9 g/dL — ABNORMAL LOW (ref 12.0–15.0)
MCH: 32.7 pg (ref 26.0–34.0)
MCHC: 33.1 g/dL (ref 30.0–36.0)
MCV: 98.7 fL (ref 80.0–100.0)
Platelets: 325 10*3/uL (ref 150–400)
RBC: 3.03 MIL/uL — ABNORMAL LOW (ref 3.87–5.11)
RDW: 13.1 % (ref 11.5–15.5)
WBC: 13 10*3/uL — ABNORMAL HIGH (ref 4.0–10.5)
nRBC: 0 % (ref 0.0–0.2)

## 2024-02-13 LAB — GLUCOSE, CAPILLARY: Glucose-Capillary: 162 mg/dL — ABNORMAL HIGH (ref 70–99)

## 2024-02-13 MED ORDER — HYDROMORPHONE HCL 2 MG PO TABS
1.0000 mg | ORAL_TABLET | Freq: Four times a day (QID) | ORAL | 0 refills | Status: DC | PRN
  Filled 2024-02-13: qty 15, 8d supply, fill #0

## 2024-02-13 NOTE — Discharge Summary (Signed)
 Physician Discharge Summary   Kirsten: Kirsten Mcmahon MRN: [id number] DOB: 20-Dec-1945  Admit date:     02/06/2024  Discharge date: 02/14/24  Discharge Physician: Rickey Barbara   PCP: [facility]   Recommendations at discharge:    Follow up with PCP in 1-2 weeks Follow up with scheduled HD F/u with Orthopedic Surgery as scheduled  Discharge Diagnoses: Principal Problem:   Encephalopathy acute Active Problems:   Acute cystitis without hematuria   ABLA (acute blood loss anemia)   Closed fracture of left distal humerus  Resolved Problems:   * No resolved hospital problems. *  Hospital Course: 78 y.o. female with medical history significant of end-stage renal disease on hemodialysis MWF, history of stroke in 1998 with residual left-sided weakness, dementia, presented history of seizure disorder, chronic anemia, hypertension, hypothyroidism, non-insulin-dependent diabetes mellitus and history of breast cancer (ER/PR/HER2-)status post radical mastectomy in 2024, on adjuvant therapy with anastrozole   She was brought in earlier in the day by son on account of altered mental status from baseline.  Kirsten was reported to be agitated on arrival   Assessment and Plan: Acute encephalopathy:  -Metabolic etiology likely from UTI.   -CT scan shows no acute abnormality. -Cont with supportive care. Pt tolerating rocephin -Became more alert and interactive. Improving   Dementia:  -With increased delirium and behavioral abnormality.   -Continue agitation protocol as instituted in the ED -Cont with supportive care   History of seizure disorder:  -Kirsten is on Vimpat on days of dialysis.  -Followed by Neurology as outpatient, last seen on 12/24  -continued home meds -Seizure precautions in place with IV Ativan as needed for seizures this visit -reviewed EEG. No seizures noted   End-stage renal disease:  -Nephrology following for HD.  -Cont HD as per Nephrology    Urinary tract infection:  -UA is suggestive of UTI in the setting of increased mental status change with leukocytosis -urine cx pending, thus far pos for >100,000 pan-sensitive ecoli -Completed 5 day course of rocephin  -Blood cultures negative x 3 days   Closed left humeral fracture: -Acutely displaced distal humerus shaft fracture whilst Kirsten was being cared for by nurses.  Likely pathologic fracture from osteoporosis.   -Orthopedics consulted. Recs to cont NWB LUE in splint, recs for non-operative management -Ortho to f/u in 2 weeks -continued on analgesia this admission   Acute on chronic anemia:  -Kirsten looks euvolemic at this time.   -Pt was transfused 1 unit of PRBC.  Baseline hemoglobin of ~10, target hemoglobin greater than 8.    History of CVA with residual left-sided weakness: CT scan without any acute intracranial moiety.  Chronic ischemic changes were noted.   Hypercalcemia: Held calcitriol.  Ionized calcium levels were normal.  Tumor lysis syndrome less likely.   Essential hypertension:  -BP stable at present -Per chart review, pt's son is not giving metoprolol out of fear of hypotension, especially while on HD. Cont to hold   History of breast cancer status post radical mastectomy.  Currently on adjuvant chemotherapy.   Hypothyroidism:: Continued with Synthroid.   Constipation -Moderate stool on imaging -Prescribed cathartics  Consultants: Nephrology Procedures performed:   Disposition: Home Diet recommendation:  Renal diet DISCHARGE MEDICATION: Allergies as of 02/14/2024       Reactions   Aricept [donepezil] Nausea And Vomiting   Contrast Media [iodinated Contrast Media] Swelling   Latex Itching   Shellfish Allergy Other (See Comments)   Unknown reaction   Levemir [  insulin Detemir] Itching        Medication List     STOP taking these medications    metoprolol succinate 25 MG 24 hr tablet Commonly known as: TOPROL-XL   oxyCODONE 5 MG  immediate release tablet Commonly known as: Roxicodone       TAKE these medications    acetaminophen 500 MG tablet Commonly known as: TYLENOL Take 500-1,000 mg by mouth daily as needed for moderate pain (pain score 4-6), fever or headache.   anastrozole 1 MG tablet Commonly known as: ARIMIDEX Take 1 tablet (1 mg total) by mouth daily.   aspirin EC 81 MG tablet Take 81 mg by mouth daily.   bisacodyl 10 MG suppository Commonly known as: Dulcolax Place 1 suppository (10 mg total) rectally as needed for moderate constipation.   calcitRIOL 0.5 MCG capsule Commonly known as: ROCALTROL Take 1 capsule (0.5 mcg total) by mouth every Monday, Wednesday, and Friday with hemodialysis.   Dialyvite 800-Zinc 15 0.8 MG Tabs Take 1 tablet by mouth daily.   docusate sodium 100 MG capsule Commonly known as: Colace Take 1 capsule (100 mg total) by mouth 2 (two) times daily. What changed: when to take this   donepezil 10 MG tablet Commonly known as: ARICEPT Take 10 mg by mouth at bedtime.   HYDROmorphone 2 MG tablet Commonly known as: DILAUDID Take 0.5 tablets (1 mg total) by mouth every 6 (six) hours as needed for severe pain (pain score 7-10).   Lacosamide 150 MG Tabs Take 1 tablet (150 mg total) by mouth 2 (two) times daily.   latanoprost 0.005 % ophthalmic solution Commonly known as: XALATAN Place 1 drop into both eyes at bedtime.   levothyroxine 112 MCG tablet Commonly known as: SYNTHROID Take 1 tablet (112 mcg total) by mouth daily before breakfast.   MIRCERA IJ See admin instructions. 2 times a month at dialysis   polyethylene glycol 17 g packet Commonly known as: MiraLax Take 17 g by mouth daily.   senna 8.6 MG Tabs tablet Commonly known as: SENOKOT Take 1 tablet (8.6 mg total) by mouth daily.   Velphoro 500 MG chewable tablet Generic drug: sucroferric oxyhydroxide Chew 500 mg by mouth 3 (three) times daily with meals.               Durable Medical  Equipment  (From admission, onward)           Start     Ordered   02/09/24 1105  For home use only DME Hospital bed  Once       Comments: Please call son Tzipora Mcinroy [phone]  Question Answer Comment  Length of Need Lifetime   Kirsten has (list medical condition): Acute encephalopathy: Metabolic etiology likely from UTI. CVA   The above medical condition requires: Kirsten requires the ability to reposition frequently   Head must be elevated greater than: 45 degrees   Bed type Semi-electric   Support Surface: Gel Overlay      02/09/24 1105            Follow-up Information     [facility] Follow up.   Specialty: [facility] Contact information: [address] [place] [address] [phone]         Cindra Eves Follow up.   Why: 02/14/24 at 1145 am , family arranged Contact information: [address]  [phone]        London Sheer, MD.  Go in 2 week(s).   Specialty: Orthopedic Surgery Why: Hospital follow up Contact information: [address] [place] [address] [phone]                Discharge Exam: Ceasar Mons Weights   02/10/24 1220 02/10/24 1620 02/13/24 1350  Weight: 62 kg 60 kg 60 kg   General exam: Awake, laying in bed, in nad Respiratory system: Normal respiratory effort, no wheezing Cardiovascular system: regular rate, s1, s2 Gastrointestinal system: Soft, nondistended, positive BS Central nervous system: CN2-12 grossly intact, strength intact Extremities: Perfused, no clubbing Skin: Normal skin turgor, no notable skin lesions seen Psychiatry: Mood normal // no visual hallucinations   Condition at discharge: fair  The results of significant diagnostics from this hospitalization (including imaging, microbiology, ancillary and laboratory) are listed below for reference.   Imaging Studies: DG Abd 1 View Result Date: 02/13/2024 CLINICAL DATA:  Constipation. EXAM: ABDOMEN -  1 VIEW COMPARISON:  None Available. FINDINGS: Moderate stool in the ascending colon. Mild-to-moderate stool throughout the remainder of the colon. No small bowel distension or evidence of obstruction. Calcifications in the upper abdomen are likely costochondral. There are vascular calcifications in the pelvis per IMPRESSION: Moderate stool in the ascending colon. Mild-to-moderate stool throughout the remainder of the colon. No obstruction. Electronically Signed   By: Narda Rutherford M.D.   On: 02/13/2024 23:12   EEG adult Result Date: 02/09/2024 [name], MD     02/09/2024  6:34 PM Kirsten Mcmahon MRN: [id number] Epilepsy Attending: [name] Referring Physician/Provider: Jerald Kief, MD Date: 02/09/2024 Duration: 25.18 mins Kirsten history: 78yo F with ams. EEG to evaluate for seizure Level of alertness: Awake AEDs during EEG study: LCM Technical aspects: This EEG study was done with scalp electrodes positioned according to the 10-20 International system of electrode placement. Electrical activity was reviewed with band pass filter of 1-70Hz , sensitivity of 7 uV/mm, display speed of 30mm/sec with a 60Hz  notched filter applied as appropriate. EEG data were recorded continuously and digitally stored.  Video monitoring was available and reviewed as appropriate. Description: EEG showed continuous generalized 3 to 6 Hz theta-delta slowing. There are also sharply contoured waves in the right centro-parietal region consistent with underlying breach artifact. Spikes were noted in right centro-parietal region. Hyperventilation and photic stimulation were not performed.   ABNORMALITY -Spike,right centro-parietal region -Breach artifact, right centro-parietal region -Continuous slow, generalized IMPRESSION: This study is consistent with Kirsten's history of epilepsy arising from centro-parietal region. Additionally there is evidence of cortical dysfunction in right centro-parietal region  consistent with prior craniotomy and underlying stroke. Lastly there is also moderate degree of encephalopathy.  No seizures were seen throughout the recording.  [name]  DG Humerus Left Result Date: 02/08/2024 CLINICAL DATA:  Recent fall EXAM: LEFT HUMERUS - 2+ VIEW COMPARISON:  02/06/2024 FINDINGS: Splinting material is now noted in place. The distal humeral fracture has been reduced. Old healed midshaft fracture is again noted. IMPRESSION: Status post reduction and splinting. Electronically Signed   By: Alcide Clever M.D.   On: 02/08/2024 02:33   DG Shoulder Left Result Date: 02/07/2024 CLINICAL DATA:  Injury EXAM: LEFT SHOULDER - 2+ VIEW COMPARISON:  None Available. FINDINGS: Acute displaced distal humerus fracture adjacent to chronic humerus fracture deformity. No acute fracture or malalignment at the shoulder. Slight inferior positioning of the humeral head. IMPRESSION: Slight inferior positioning of the humeral head without acute fracture abnormality. Acute distal humerus fracture adjacent to chronic fracture  deformity Electronically Signed   By: Jasmine Pang M.D.   On: 02/07/2024 00:44   DG Humerus Left Result Date: 02/07/2024 CLINICAL DATA:  Injury EXAM: LEFT HUMERUS - 2+ VIEW COMPARISON:  02/06/2024 FINDINGS: Remote fracture deformity mid shaft of the humerus. Interval acute fracture involving the distal shaft of the humerus with greater than 1 shaft diameter lateral displacement of distal fracture fragment. Acute fracture is at the inferior margin of the chronic fracture IMPRESSION: Acute displaced distal humerus shaft fracture slightly caudal to a chronic humerus fracture deformity Electronically Signed   By: Jasmine Pang M.D.   On: 02/07/2024 00:43   CT Head Wo Contrast Result Date: 02/06/2024 CLINICAL DATA:  Altered mental status EXAM: CT HEAD WITHOUT CONTRAST TECHNIQUE: Contiguous axial images were obtained from the base of the skull through the vertex without intravenous contrast.  RADIATION DOSE REDUCTION: This exam was performed according to the departmental dose-optimization program which includes automated exposure control, adjustment of the mA and/or kV according to Kirsten size and/or use of iterative reconstruction technique. COMPARISON:  12/10/2023 FINDINGS: Brain: No evidence of acute infarction, hemorrhage, hydrocephalus, extra-axial collection or mass lesion/mass effect. Chronic atrophic and ischemic changes are noted. Diffuse encephalomalacia is noted in distribution of the right middle cerebral artery and right posterior cerebral artery consistent with prior infarct. Prior craniectomy on the right is noted stable from the prior exam. No new focal abnormality is noted. Vascular: No hyperdense vessel or unexpected calcification. Skull: Craniectomy defect stable in appearance. Sinuses/Orbits: No acute finding. Other: None. IMPRESSION: Chronic ischemic and atrophic changes similar to that seen on the prior exam. No acute abnormality noted. Electronically Signed   By: Alcide Clever M.D.   On: 02/06/2024 20:21   DG Humerus Left Result Date: 02/06/2024 CLINICAL DATA:  Left arm pain EXAM: LEFT HUMERUS - 2+ VIEW COMPARISON:  None Available. FINDINGS: Prior left midshaft fracture is noted with healing. Mild deformity related to the healing is seen. No acute fracture is noted. No soft tissue abnormality is seen. IMPRESSION: Old healed midshaft humeral fracture. Electronically Signed   By: Alcide Clever M.D.   On: 02/06/2024 20:19   DG Chest 2 View Result Date: 02/06/2024 CLINICAL DATA:  Altered mental status. EXAM: CHEST - 2 VIEW COMPARISON:  Chest radiograph 05/30/2023, 12/27/2022 FINDINGS: Cardiac silhouette is again moderately enlarged. Mediastinal contours are unchanged with unchanged tortuosity of the thoracic aorta. Surgical clips again overlie the inferior right neck likely status post right thyroidectomy and prior as seen on prior CT. Resolution of the prior interstitial thickening  seen on most recent 05/30/2023 radiograph. No definite focal airspace opacity to indicate pneumonia. Lateral view is limited by low lung volumes and overlying arm. No definite pleural effusion. No pneumothorax. Surgical clips overlie the right chest wall seen within the right breast on prior CT. Additional new surgical clips within the right axilla. IMPRESSION: 1. Resolution of the prior interstitial thickening seen on most recent 05/30/2023 radiograph. No definite focal airspace opacity to indicate pneumonia. 2. Unchanged moderate cardiomegaly. Electronically Signed   By: Neita Garnet M.D.   On: 02/06/2024 20:18    Microbiology: Results for orders placed or performed during the hospital encounter of 02/06/24  Urine Culture (for pregnant, neutropenic or urologic patients or patients with an indwelling urinary catheter)     Status: Abnormal   Collection Time: 02/07/24  8:51 AM   Specimen: Urine, Clean Catch  Result Value Ref Range Status   Specimen Description URINE, CLEAN CATCH  Final  Special Requests   Final    NONE Performed at [facility], [address] [place], [place] [address]    Culture >=100,000 COLONIES/mL ESCHERICHIA COLI (A)  Final   Report Status 02/09/2024 FINAL  Final   Organism ID, Bacteria ESCHERICHIA COLI (A)  Final      Susceptibility   Escherichia coli - MIC*    AMPICILLIN 4 SENSITIVE Sensitive     CEFAZOLIN <=4 SENSITIVE Sensitive     CEFEPIME <=0.12 SENSITIVE Sensitive     CEFTRIAXONE <=0.25 SENSITIVE Sensitive     CIPROFLOXACIN <=0.25 SENSITIVE Sensitive     GENTAMICIN <=1 SENSITIVE Sensitive     IMIPENEM <=0.25 SENSITIVE Sensitive     NITROFURANTOIN <=16 SENSITIVE Sensitive     TRIMETH/SULFA <=20 SENSITIVE Sensitive     AMPICILLIN/SULBACTAM <=2 SENSITIVE Sensitive     PIP/TAZO <=4 SENSITIVE Sensitive ug/mL    * >=100,000 COLONIES/mL ESCHERICHIA COLI  Culture, blood (Routine X 2) w Reflex to ID Panel     Status: None (Preliminary result)   Collection  Time: 02/10/24  7:00 AM   Specimen: BLOOD RIGHT HAND  Result Value Ref Range Status   Specimen Description BLOOD RIGHT HAND  Final   Special Requests BOTTLES DRAWN AEROBIC AND ANAEROBIC  Final   Culture   Final    NO GROWTH 4 DAYS Performed at [facility], [address], [place], [place] [address]    Report Status PENDING  Incomplete  Culture, blood (Routine X 2) w Reflex to ID Panel     Status: None (Preliminary result)   Collection Time: 02/10/24  7:05 AM   Specimen: BLOOD LEFT HAND  Result Value Ref Range Status   Specimen Description BLOOD LEFT HAND  Final   Special Requests   Final    BOTTLES DRAWN AEROBIC AND ANAEROBIC Blood Culture adequate volume   Culture   Final    NO GROWTH 4 DAYS Performed at [facility], [address] [place], [place] [address]    Report Status PENDING  Incomplete    Labs: CBC: Recent Labs  Lab 02/08/24 0647 02/13/24 0812  WBC 12.0* 13.0*  HGB 8.3* 9.9*  HCT 24.9* 29.9*  MCV 98.4 98.7  PLT 217 325   Basic Metabolic Panel: Recent Labs  Lab 02/08/24 0647 02/13/24 0812  NA 141 136  K 3.9 3.4*  CL 101 92*  CO2 26 25  GLUCOSE 158* 121*  BUN 58* 51*  CREATININE 10.90* 9.59*  CALCIUM 9.8 9.1   Liver Function Tests: Recent Labs  Lab 02/08/24 0647 02/13/24 0812  AST 18 22  ALT 14 18  ALKPHOS 53 52  BILITOT 0.5 0.6  PROT 6.5 7.1  ALBUMIN 2.9* 3.0*   CBG: Recent Labs  Lab 02/10/24 2348 02/13/24 1551  GLUCAP 121* 162*    Discharge time spent: less than 30 minutes.  Signed: Rickey Barbara, MD [facility] 02/14/2024

## 2024-02-13 NOTE — Progress Notes (Signed)
   02/13/24 1829  Vitals  Pulse Rate 86  Resp 19  BP (!) 140/77  SpO2 100 %  O2 Device Room Air  Oxygen Therapy  Patient Activity (if Appropriate) In bed  Pulse Oximetry Type Continuous  Oximetry Probe Site Changed No  During Treatment Monitoring  Blood Flow Rate (mL/min) 0 mL/min  Arterial Pressure (mmHg) 18.99 mmHg  Venous Pressure (mmHg) -1.21 mmHg  TMP (mmHg) -50.7 mmHg  Ultrafiltration Rate (mL/min) 0 mL/min  Dialysate Flow Rate (mL/min) 0 ml/min  Dialysate Potassium Concentration 3  Dialysate Calcium Concentration 2.5  Duration of HD Treatment -hour(s) 3.5 hour(s)  Cumulative Fluid Removed (mL) per Treatment  -115.36  HD Safety Checks Performed Yes  Intra-Hemodialysis Comments Tx completed   Received patient in bed to unit.  Alert and oriented.  Informed consent signed and in chart.   TX duration: 3.5 hours  Pt. vomited a moderate amount of cherry color food and liquid. Fluid removal turned off pt calmed down vomiting stopped and pt is more aroused. After, pt is doing well and very appreciative for the assistance. Transported back to the room. Alert, without acute distress. Hand-off given to patient's nurse.   Access used: RAVG Access issues: None  Total UF removed: -0.1 Medication(s) given: See Wanda Plump, LPN  [facility]

## 2024-02-13 NOTE — Progress Notes (Signed)
 Patient is refusing oral meds .Tried multiple times for her vimpat but she is being physically combative and refusing anything orally.

## 2024-02-13 NOTE — Plan of Care (Signed)
   Problem: Health Behavior/Discharge Planning: Goal: Ability to manage health-related needs will improve Outcome: Progressing   Problem: Clinical Measurements: Goal: Ability to maintain clinical measurements within normal limits will improve Outcome: Progressing

## 2024-02-13 NOTE — Treatment Plan (Signed)
 Notified by RN that pt had episode of NV while in HD. Stable and conversant at present. No bm documented in over 4 days. Discussed with pt's POA, son. Given N/V, will observe overnight. Have ordered abd xray to r/o obstruction or obstipation given concurrent narcotic use. Discharge cancelled for today

## 2024-02-13 NOTE — Discharge Planning (Signed)
  Kidney Dialysis Patient Discharge Orders- [facility] CLINIC: [facility]  Patient's name: LIVANA YERIAN Admit/DC Dates: 02/06/2024 - 02/13/24  Discharge Diagnoses: Acute encephalopathy - baseline dementia c/b UTI  E. coli UTI  Displaced L humeral fx - non operative management   Recent Labs  Lab 02/13/24 0812  HGB 9.9*  K 3.4*  CALCIUM 9.1  ALBUMIN 3.0*   Aranesp: --   Date of last dose/amount: --   PRBC's Given: Yes  Date/# of units: 3/4  1 unit   Outpatient Dialysis Orders:  -Heparin: No change   -Bath: No change   -EDW 61.5 kg.   Under EDW here    Access intervention/Change: ---   Medication Orders: -BMM: No change   -IV Antibiotics: ---    Completed by: Tomasa Blase PA-C   D/C Meds to be reconciled by nurse after every discharge.    Reviewed by: MD:______ RN_______

## 2024-02-13 NOTE — Progress Notes (Signed)
  [facility] Progress Note   Subjective:  Seen in room - sitting up in recliner. No complaints.  Dialysis today   Objective Vitals:   02/12/24 1506 02/12/24 2047 02/13/24 0413 02/13/24 0735  BP: 125/60 132/69 (!) 112/56 125/63  Pulse: 90 92 86 93  Resp: 17 (!) 22 17 16   Temp: 98.2 F (36.8 C) 99.5 F (37.5 C) 97.6 F (36.4 C) 98.2 F (36.8 C)  TempSrc: Oral  Oral   SpO2: 100% 100% 100% 97%  Weight:      Height:       Physical Exam General: Elderly woman, NAD. Heart: RRR; no murmur Lungs: CTA anteriorly Abdomen: soft Extremities: no LE edema, LUE bandaged Dialysis Access: R AVG +t/b  Additional Objective Labs: Basic Metabolic Panel: Recent Labs  Lab 02/07/24 0041 02/08/24 0647 02/13/24 0812  NA 142 141 136  K 5.3* 3.9 3.4*  CL 98 101 92*  CO2 30 26 25   GLUCOSE 136* 158* 121*  BUN 45* 58* 51*  CREATININE 9.22* 10.90* 9.59*  CALCIUM 11.1* 9.8 9.1   Liver Function Tests: Recent Labs  Lab 02/07/24 0041 02/08/24 0647 02/13/24 0812  AST 50* 18 22  ALT 22 14 18   ALKPHOS 49 53 52  BILITOT 1.4* 0.5 0.6  PROT 6.5 6.5 7.1  ALBUMIN 3.1* 2.9* 3.0*   CBC: Recent Labs  Lab 02/07/24 0041 02/08/24 0647 02/13/24 0812  WBC 10.4 12.0* 13.0*  HGB 7.2* 8.3* 9.9*  HCT 22.1* 24.9* 29.9*  MCV 101.8* 98.4 98.7  PLT 129* 217 325   Medications:   (feeding supplement) PROSource Plus  30 mL Oral BID BM   anastrozole  1 mg Oral Daily   aspirin EC  81 mg Oral Daily   Chlorhexidine Gluconate Cloth  6 each Topical Q0600   cinacalcet  30 mg Oral Q M,W,F-HD   docusate sodium  100 mg Oral BID   donepezil  10 mg Oral QHS   heparin  5,000 Units Subcutaneous Q8H   lacosamide  150 mg Oral BID   levothyroxine  112 mcg Oral QAC breakfast    Dialysis Orders MWF G-O  3h  B350   62.5kg  2K bath R AVG   heparin 2000 - calcitriol 1.5 mcg po three times per week - sensipar 30mg  three times per week - last Hb 8.2, not on esa     Assessment/Plan: Acute  encephalopathy: Baseline dementia. On abx for UTI. Seems to be at baseline today  E. Coli UTI - on Rocephin  L humerus fracture - new this admit, non-operative management planned. ESRD - on HD MWF. HD today . HTN/volume: BP controlled, continue home meds. No LE edema, CXR clear. Below prior EDW, change on discharge. Anemia of ESRD: Hgb 8-9 Not on ESA d/t breast cancer. Transfuse if <7.  Secondary hyperparathyroidism: CorrCa high - VDRA on hold, sensipar resumed. Nutrition: Will add supps if she is willing. Dementia H/o CVA - w/ L sided weakness For discharge after HD today   Tomasa Blase PA-C [facility] 02/13/2024,11:47 AM

## 2024-02-13 NOTE — Progress Notes (Signed)
 Physical Therapy Treatment Patient Details Name: Kirsten Mcmahon MRN: [id number] DOB: 27-Nov-1946 Today's Date: 02/13/2024   History of Present Illness Patient is a 78 year old female presenting with altered mental status. Found to have acute fracture of the L humerus, treated non-operatively with padded splint and sling. History of  dementia,ESRD, CVA with left residual weakness, seizure disorder.    PT Comments  Pt continues to require significant physical assistance for all functional mobility tasks. Pt demonstrates a poor ability to consistently follow commands throughout session, resulting in impaired initiation. Pt will benefit from continued frequent mobilization in an effort to improve mobility quality and to reduce caregiver burden. Pt's son electing to take the pt home, pt will benefit from HHPT along with a hospital bed. A hoyer lift would also be beneficial, however based on recent case management notes it appears this has been declined.   If plan is discharge home, recommend the following: A lot of help with walking and/or transfers;A lot of help with bathing/dressing/bathroom;Assistance with cooking/housework;Assistance with feeding;Assist for transportation;Help with stairs or ramp for entrance;Direct supervision/assist for medications management;Direct supervision/assist for financial management;Supervision due to cognitive status   Can travel by private vehicle     No  Equipment Recommendations  Hospital bed;Hoyer lift    Recommendations for Other Services       Precautions / Restrictions Precautions Precautions: Fall Recall of Precautions/Restrictions: Impaired Required Braces or Orthoses: Sling Splint/Cast: padded splint applied by MD Restrictions Weight Bearing Restrictions Per Provider Order: Yes LUE Weight Bearing Per Provider Order: Non weight bearing     Mobility  Bed Mobility Overal bed mobility: Needs Assistance Bed Mobility: Supine to Sit     Supine to  sit: Max assist, HOB elevated          Transfers Overall transfer level: Needs assistance Equipment used: 1 person hand held assist Transfers: Bed to chair/wheelchair/BSC       Squat pivot transfers: From elevated surface, Total assist     General transfer comment: pt with poor initiation, does appear to follow command for increased trunk flexion    Ambulation/Gait                   Stairs             Wheelchair Mobility     Tilt Bed    Modified Rankin (Stroke Patients Only)       Balance Overall balance assessment: Needs assistance Sitting-balance support: No upper extremity supported, Feet supported Sitting balance-[name] Scale: Fair                                      Communication Communication Communication: Impaired Factors Affecting Communication: Reduced clarity of speech;Difficulty expressing self  Cognition Arousal: Alert Behavior During Therapy: Flat affect   PT - Cognitive impairments: No family/caregiver present to determine baseline, Difficult to assess Difficult to assess due to: Impaired communication                     PT - Cognition Comments: pt with impaired ability to follow commands throughout session. Appears to have poor awareness of situation Following commands: Impaired Following commands impaired: Follows one step commands inconsistently, Follows one step commands with increased time    Cueing Cueing Techniques: Verbal cues, Gestural cues, Tactile cues, Visual cues  Exercises      General Comments General comments (skin integrity, edema,  etc.): VSS on RA      Pertinent Vitals/Pain Pain Assessment Pain Assessment: PAINAD Breathing: normal Negative Vocalization: occasional moan/groan, low speech, negative/disapproving quality Facial Expression: smiling or inexpressive Body Language: tense, distressed pacing, fidgeting Consolability: no need to console PAINAD Score: 2    Home Living                           Prior Function            PT Goals (current goals can now be found in the care plan section) Acute Rehab PT Goals Patient Stated Goal: To get to recliner today, Progress towards PT goals: Not progressing toward goals - comment (impaired cognition)    Frequency    Min 2X/week      PT Plan      Co-evaluation              AM-PAC PT "6 Clicks" Mobility   Outcome Measure  Help needed turning from your back to your side while in a flat bed without using bedrails?: A Lot Help needed moving from lying on your back to sitting on the side of a flat bed without using bedrails?: A Lot Help needed moving to and from a bed to a chair (including a wheelchair)?: A Lot Help needed standing up from a chair using your arms (e.g., wheelchair or bedside chair)?: Total Help needed to walk in hospital room?: Total Help needed climbing 3-5 steps with a railing? : Total 6 Click Score: 9    End of Session Equipment Utilized During Treatment: Gait belt Activity Tolerance: Patient tolerated treatment well Patient left: in chair;with call bell/phone within reach;with chair alarm set Nurse Communication: Mobility status;Need for lift equipment PT Visit Diagnosis: Muscle weakness (generalized) (M62.81);Unsteadiness on feet (R26.81)     Time: 1610-9604 PT Time Calculation (min) (ACUTE ONLY): 24 min  Charges:    $Therapeutic Activity: 23-37 mins PT General Charges $$ ACUTE PT VISIT: 1 Visit                     Arlyss Gandy, PT, DPT Acute Rehabilitation Office [phone]    Kirsten Mcmahon 02/13/2024, 10:28 AM

## 2024-02-13 NOTE — TOC Progression Note (Addendum)
 Transition of Care (TOC) - Progression Note     MD secure chatted NCM that patient ready for discharge today.   NCM secure chatted HD coordinator , with added renal team   Patient can be discharged today and have HD at her OP clinic tomorrow if they can schedule her in . Patient also has a PCP appointment tomorrow.    NCM called patient's son Kirsten Mcmahon [phone] .    He would like HD done here at hospital today and her to discharge home after HD today. He  has not received hospital bed but says it does not have to be delivered prior to patient getting home.    NCM called Zach with [employer] he  will follow up  regarding bed. Per [employer] they have tried to call Kirsten Mcmahon but no answer. They will try again now.    Her appointment tomorrow to establish care with a PCP  1145 am , with Cindra Eves with Vanessa Ralphs with [facility] aware discharge today.    Plan patient will have HD here at hospital today ( Renal MD will ask HD to put patient on early second round). Patient will discharge home later today via PTAR , NCM confirmed address with patient's son Kirsten Mcmahon .   PATR paperwork work on chart    Kirsten Mcmahon has talked to [employer] and hospital bed will be delivered today at 4 pm   1622 Patient still in hemodialysis. NCM call PTAR spoke to [place], placed patient on will call. Nurse will call PTAR back when patient ready to discharge and also call son    Patient Details  Name: Kirsten Mcmahon MRN: [id number] Date of Birth: 04-20-1946  Transition of Care [facility]) CM/SW Contact  Vyctoria Dickman, Adria Devon, RN Phone Number: 02/13/2024, 10:44 AM  Clinical Narrative:       Expected Discharge Plan: Skilled Nursing Facility Barriers to Discharge: Continued Medical Work up, Transportation, English as a second language teacher, Equipment Delay  Expected Discharge Plan and Services     Post Acute Care Choice: Home Health Living arrangements for the past 2 months: Single Family Home                            HH Arranged: PT, OT           Social Determinants of Health (SDOH) Interventions SDOH Screenings   Food Insecurity: Patient Declined (02/07/2024)  Housing: Patient Declined (02/07/2024)  Transportation Needs: Patient Declined (02/07/2024)  Utilities: Patient Declined (02/07/2024)  Social Connections: Patient Declined (02/07/2024)  Tobacco Use: Low Risk  (02/06/2024)    Readmission Risk Interventions     No data to display

## 2024-02-13 NOTE — Progress Notes (Signed)
 Per HD Handoff,  Pt had an episode of emesis and abd control. MD notified.  D/c canceled.  Pt returned to unit ~1900.  TOC meds had already been picked up. Per my charge RN, meds to be returned to [facility] since now after hours. This RN walked meds (dilaudid) and dropped them off at [facility].

## 2024-02-13 NOTE — Progress Notes (Addendum)
 Contacted by staff this morning regarding pt's d/c to home today. It was discussed that pt was appropriate for out-pt HD tomorrow except pt has appt tomorrow with pcp and son was not agreeable to out-pt HD tomorrow. It also seemed very unlikely that pt would be able to do 1st shift HD tomorrow and make it to MD appt as well. It was felt in pt's best interest to receive HD today prior to d/c to home via EMS. Contacted Berenice Primas to be advised that pt will d/c today and should resume care on Wednesday.   Olivia Canter Renal Navigator [phone]

## 2024-02-14 ENCOUNTER — Other Ambulatory Visit (HOSPITAL_COMMUNITY): Payer: Self-pay

## 2024-02-14 MED ORDER — ONDANSETRON 4 MG PO TBDP
4.0000 mg | ORAL_TABLET | Freq: Three times a day (TID) | ORAL | Status: DC | PRN
  Administered 2024-02-14: 4 mg via ORAL
  Filled 2024-02-14: qty 1

## 2024-02-14 MED ORDER — SENNA 8.6 MG PO TABS
1.0000 | ORAL_TABLET | Freq: Every day | ORAL | 0 refills | Status: DC
  Filled 2024-02-14: qty 120, 120d supply, fill #0

## 2024-02-14 MED ORDER — POLYETHYLENE GLYCOL 3350 17 GM/SCOOP PO POWD
17.0000 g | Freq: Every day | ORAL | 0 refills | Status: DC
  Filled 2024-02-14: qty 238, 14d supply, fill #0

## 2024-02-14 MED ORDER — BISACODYL 10 MG RE SUPP
10.0000 mg | RECTAL | 0 refills | Status: DC | PRN
  Filled 2024-02-14: qty 12, 30d supply, fill #0

## 2024-02-14 NOTE — TOC Progression Note (Signed)
 Transition of Care (TOC) - Progression Note   Patient being discharged today. Nurse and MD ready. NCM called son [name] confirmed he is aware at the home. PTAR called PTAR paperwork in patient's drawer  Patient Details  Name: Kirsten Mcmahon MRN: [id number] Date of Birth: 05/15/1946  Transition of Care [facility]) CM/SW Contact  Kordelia Severin, Adria Devon, RN Phone Number: 02/14/2024, 12:38 PM  Clinical Narrative:       Expected Discharge Plan: Skilled Nursing Facility Barriers to Discharge: Continued Medical Work up, Transportation, English as a second language teacher, Equipment Delay  Expected Discharge Plan and Services     Post Acute Care Choice: Home Health Living arrangements for the past 2 months: Single Family Home Expected Discharge Date: 02/14/24                         HH Arranged: PT, OT           Social Determinants of Health (SDOH) Interventions SDOH Screenings   Food Insecurity: Patient Declined (02/07/2024)  Housing: Patient Declined (02/07/2024)  Transportation Needs: Patient Declined (02/07/2024)  Utilities: Patient Declined (02/07/2024)  Social Connections: Patient Declined (02/07/2024)  Tobacco Use: Low Risk  (02/06/2024)    Readmission Risk Interventions     No data to display

## 2024-02-14 NOTE — Plan of Care (Signed)
  Problem: Activity: Goal: Risk for activity intolerance will decrease Outcome: Progressing   Problem: Nutrition: Goal: Adequate nutrition will be maintained Outcome: Progressing   Problem: Coping: Goal: Level of anxiety will decrease Outcome: Progressing   Problem: Pain Managment: Goal: General experience of comfort will improve and/or be controlled Outcome: Progressing   Problem: Education: Goal: Knowledge of General Education information will improve Description: Including pain rating scale, medication(s)/side effects and non-pharmacologic comfort measures Outcome: Not Progressing

## 2024-02-14 NOTE — Progress Notes (Signed)
 Physical Therapy Treatment Patient Details Name: Kirsten Mcmahon MRN: [id number] DOB: January 31, 1946 Today's Date: 02/14/2024   History of Present Illness Patient is a 78 year old female presenting with altered mental status. Found to have acute fracture of the L humerus, treated non-operatively with padded splint and sling. History of  dementia,ESRD, CVA with left residual weakness, seizure disorder.    PT Comments  Pt received in supine, partially rotated with her BLE toward L side of bed and her head next to R bed side rail, pt alert and confused, restless and RN present, requesting PTA assist to reposition her for safety/comfort. Pt needing maxA to totalA +2 for posterior supine scooting toward HOB with bed pad assist (cued to push with her legs but not achieving good propulsion and pt unable to use BUE to assist). Pt instructed on LUE NWB and sling still in place, worked on positioning in supine for pressure relief and LUE edema mgmt, pt heels floated and in upright chair posture, pt able to self-feed a few bites of lunch with supervision/cues but stopping when not cued, RN notified she may need more supervision to eat today due to cognitive deficit. Pt room blinds opened to promote alertness and improve awareness of normal day/night cycles. Pt will continue to benefit from skilled rehab in a post acute setting to maximize functional gains before returning home however per chart review pt family prefers her to go home, in which case she would benefit from hospital bed and hoyer lift to reduce fall risk and reduce risk of injury given LUE NWB precs are making mobility more challenging for her.     If plan is discharge home, recommend the following: A lot of help with bathing/dressing/bathroom;Assistance with cooking/housework;Assistance with feeding;Assist for transportation;Help with stairs or ramp for entrance;Direct supervision/assist for medications management;Direct supervision/assist for financial  management;Supervision due to cognitive status;Two people to help with walking and/or transfers   Can travel by private vehicle     No  Equipment Recommendations  Hospital bed;Hoyer lift    Recommendations for Other Services       Precautions / Restrictions Precautions Precautions: Fall Recall of Precautions/Restrictions: Impaired Precaution/Restrictions Comments: pt guarding LUE with RUE for assist with bed mobility Required Braces or Orthoses: Sling Splint/Cast: padded splint applied by MD Restrictions Weight Bearing Restrictions Per Provider Order: Yes LUE Weight Bearing Per Provider Order: Non weight bearing     Mobility  Bed Mobility Overal bed mobility: Needs Assistance Bed Mobility: Rolling Rolling: Total assist, +2 for physical assistance         General bed mobility comments: pt had slid herself down in bed and had head toward R rail and feet toward L edge of bed, RN called PTA to room to assist with pulling her up toward [facility] and repositioning for safety and pressure relief. TotalA +2 for all aspects of repositioning/care due to apparent cognitive deficit.    Transfers Overall transfer level: Needs assistance                 General transfer comment: defer, pt confused and not following simple commands well, high fall risk and per RN pt to DC today soon via PTAR so prefers her to remain in bed. Pt bed placed in chair posture so pt can attempt to eat some of her lunch and to reduce risk of aspiration. HOB elevated fully in chair posture.    Ambulation/Gait  General Gait Details: not safe to attempt today   Stairs             Wheelchair Mobility     Tilt Bed    Modified Rankin (Stroke Patients Only)       Balance Overall balance assessment: Needs assistance Sitting-balance support: Feet unsupported, Single extremity supported, No upper extremity supported Sitting balance-[name] Scale: Poor Sitting balance - Comments:  posterior lean with attempts at repositioning in long sitting needing maxA to totalA; poor to zero       Standing balance comment: defer today for pt safety                            Communication Communication Communication: Impaired Factors Affecting Communication: Reduced clarity of speech;Difficulty expressing self  Cognition Arousal: Alert Behavior During Therapy: Flat affect   PT - Cognitive impairments: No family/caregiver present to determine baseline, Difficult to assess Difficult to assess due to: Impaired communication                     PT - Cognition Comments: pt with impaired ability to follow commands throughout session. Appears to have poor awareness of situation and drowsy but restless in supine, consistently leaning toward her R side with R gaze but able to turn head slightly toward her L with cues and increased time. Following commands: Impaired Following commands impaired: Follows one step commands inconsistently, Follows one step commands with increased time    Cueing Cueing Techniques: Verbal cues, Gestural cues, Tactile cues, Visual cues  Exercises Other Exercises Other Exercises: dynamic reaching with bed in chair posture, only a few reps due to pt limited attention Other Exercises: cues for L cx rotation and L gaze, ~3 reps    General Comments General comments (skin integrity, edema, etc.): VSS on RA per chart review, no acute s/sx distress other than pt confusion and restless, but she has calm demeanor. Pt takes a few bites of food after she is positioned upright and sips of water, but stops when not cued to continue.      Pertinent Vitals/Pain Pain Assessment Pain Assessment: PAINAD Breathing: normal Negative Vocalization: occasional moan/groan, low speech, negative/disapproving quality Facial Expression: smiling or inexpressive Body Language: tense, distressed pacing, fidgeting Consolability: no need to console PAINAD Score:  2 Pain Location: LUE with bed repositioning Pain Descriptors / Indicators: Grimacing, Guarding Pain Intervention(s): Limited activity within patient's tolerance, Monitored during session, Repositioned    Home Living                          Prior Function            PT Goals (current goals can now be found in the care plan section) Acute Rehab PT Goals Patient Stated Goal: to go home PT Goal Formulation: Patient unable to participate in goal setting Time For Goal Achievement: 02/22/24 Progress towards PT goals: Not progressing toward goals - comment    Frequency    Min 2X/week      PT Plan      Co-evaluation              AM-PAC PT "6 Clicks" Mobility   Outcome Measure  Help needed turning from your back to your side while in a flat bed without using bedrails?: A Lot Help needed moving from lying on your back to sitting on the side of a flat bed without  using bedrails?: A Lot Help needed moving to and from a bed to a chair (including a wheelchair)?: Total Help needed standing up from a chair using your arms (e.g., wheelchair or bedside chair)?: Total Help needed to walk in hospital room?: Total Help needed climbing 3-5 steps with a railing? : Total 6 Click Score: 8    End of Session Equipment Utilized During Treatment: Other (comment) (bed pads) Activity Tolerance: Patient limited by fatigue;Treatment limited secondary to medical complications (Comment);Other (comment) (pt delirium vs cognitive deficit limiting carryover of cues today) Patient left: with call bell/phone within reach;in bed;with bed alarm set;Other (comment) (heels floated, pillows toward her R side to promote neutral gaze/prevent pt from pushing her head and shoulders against bed rail, sling still donned, pillow under LUE to keep wrist above elbow for edema mgmt.) Nurse Communication: Mobility status;Need for lift equipment;Precautions;Other (comment) (pt restless, probably needs more  supervision to ensure she eats her meal due to confusion.) PT Visit Diagnosis: Muscle weakness (generalized) (M62.81);Unsteadiness on feet (R26.81)     Time: 1610-9604 PT Time Calculation (min) (ACUTE ONLY): 9 min  Charges:    $Therapeutic Activity: 8-22 mins PT General Charges $$ ACUTE PT VISIT: 1 Visit                     Marlisha Vanwyk P., PTA Acute Rehabilitation Services Secure Chat Preferred 9a-5:30pm Office: [phone]    Dorathy Kinsman [facility] 02/14/2024, 3:04 PM

## 2024-02-14 NOTE — Progress Notes (Signed)
 Discharge was cancelled last night because patient became nauseated and vomited while on dialysis. Reviewed abd Xray. Findings notable for moderate stool throughout colon. Advise scheduled cathartics, will prescribe to [facility] pharmacy. Discussed with patient's son who is lookking forward to bringing pt home today and says he will provide cathartics after pt arrives home. Plan d/c today

## 2024-02-14 NOTE — Progress Notes (Signed)
  [facility] Progress Note   Subjective:   Had episode of nausea/vomiting after HD yesterday so discharge held.  Pleasant confusion this am. Feels ok. Going home today.   Objective Vitals:   02/13/24 1829 02/13/24 2010 02/14/24 0452 02/14/24 1017  BP: (!) 140/77 (!) 143/76 (!) 146/74 (!) 105/49  Pulse: 86 98 (!) 101 (!) 109  Resp: 19 18 20 17   Temp:  98.4 F (36.9 C) 98.1 F (36.7 C) 99.4 F (37.4 C)  TempSrc:  Oral Oral Oral  SpO2: 100% 98% 100% 93%  Weight:      Height:       Physical Exam General: Elderly woman, NAD. Heart: RRR; no murmur Lungs: CTA anteriorly Abdomen: soft Extremities: no LE edema, LUE bandaged Dialysis Access: R AVG +t/b  Additional Objective Labs: Basic Metabolic Panel: Recent Labs  Lab 02/08/24 0647 02/13/24 0812  NA 141 136  K 3.9 3.4*  CL 101 92*  CO2 26 25  GLUCOSE 158* 121*  BUN 58* 51*  CREATININE 10.90* 9.59*  CALCIUM 9.8 9.1   Liver Function Tests: Recent Labs  Lab 02/08/24 0647 02/13/24 0812  AST 18 22  ALT 14 18  ALKPHOS 53 52  BILITOT 0.5 0.6  PROT 6.5 7.1  ALBUMIN 2.9* 3.0*   CBC: Recent Labs  Lab 02/08/24 0647 02/13/24 0812  WBC 12.0* 13.0*  HGB 8.3* 9.9*  HCT 24.9* 29.9*  MCV 98.4 98.7  PLT 217 325   Medications:   (feeding supplement) PROSource Plus  30 mL Oral BID BM   anastrozole  1 mg Oral Daily   aspirin EC  81 mg Oral Daily   Chlorhexidine Gluconate Cloth  6 each Topical Q0600   cinacalcet  30 mg Oral Q M,W,F-HD   docusate sodium  100 mg Oral BID   donepezil  10 mg Oral QHS   heparin  5,000 Units Subcutaneous Q8H   lacosamide  150 mg Oral BID   levothyroxine  112 mcg Oral QAC breakfast    Dialysis Orders MWF G-O  3h  B350   62.5kg  2K bath R AVG   heparin 2000 - calcitriol 1.5 mcg po three times per week - sensipar 30mg  three times per week - last Hb 8.2, not on esa     Assessment/Plan: Acute encephalopathy: Baseline dementia. On abx for UTI. Seems to be at  baseline today  E. Coli UTI - on Rocephin  L humerus fracture - new this admit, non-operative management planned. ESRD - on HD MWF. Next HD 3/12  HTN/volume: BP controlled, continue home meds. No LE edema, CXR clear. Below prior EDW, change on discharge. Anemia of ESRD: Hgb 8-9 Not on ESA d/t breast cancer. Transfuse if <7.  Secondary hyperparathyroidism: CorrCa high - VDRA on hold, sensipar resumed. Nutrition: Will add supps if she is willing. Dementia H/o CVA - w/ L sided weakness For discharge today   Tomasa Blase PA-C [facility] 02/14/2024,11:30 AM

## 2024-02-14 NOTE — Progress Notes (Signed)
 D/C order noted. Contacted Berenice Primas to be advised of pt's d/c today and that pt should resume care tomorrow.   Olivia Canter Renal Navigator [phone]

## 2024-02-15 LAB — CULTURE, BLOOD (ROUTINE X 2)
Culture: NO GROWTH
Culture: NO GROWTH
Special Requests: ADEQUATE

## 2024-02-18 ENCOUNTER — Emergency Department (HOSPITAL_COMMUNITY)

## 2024-02-18 ENCOUNTER — Emergency Department (HOSPITAL_COMMUNITY)
Admission: EM | Admit: 2024-02-18 | Discharge: 2024-02-19 | Disposition: A | Discharge status: Home / Self Care | Attending: Emergency Medicine | Admitting: Emergency Medicine

## 2024-02-18 ENCOUNTER — Encounter (HOSPITAL_COMMUNITY): Payer: Self-pay

## 2024-02-18 ENCOUNTER — Other Ambulatory Visit: Payer: Self-pay

## 2024-02-18 DIAGNOSIS — G40909 Epilepsy, unspecified, not intractable, without status epilepticus: Secondary | ICD-10-CM | POA: Insufficient documentation

## 2024-02-18 DIAGNOSIS — R4182 Altered mental status, unspecified: Secondary | ICD-10-CM | POA: Diagnosis not present

## 2024-02-18 DIAGNOSIS — N186 End stage renal disease: Secondary | ICD-10-CM | POA: Diagnosis not present

## 2024-02-18 DIAGNOSIS — Z7982 Long term (current) use of aspirin: Secondary | ICD-10-CM | POA: Diagnosis not present

## 2024-02-18 DIAGNOSIS — Z992 Dependence on renal dialysis: Secondary | ICD-10-CM | POA: Insufficient documentation

## 2024-02-18 DIAGNOSIS — R569 Unspecified convulsions: Secondary | ICD-10-CM | POA: Diagnosis present

## 2024-02-18 DIAGNOSIS — Z91040 Latex allergy status: Secondary | ICD-10-CM | POA: Insufficient documentation

## 2024-02-18 LAB — COMPREHENSIVE METABOLIC PANEL
ALT: 21 U/L (ref 0–44)
AST: 25 U/L (ref 15–41)
Albumin: 3.4 g/dL — ABNORMAL LOW (ref 3.5–5.0)
Alkaline Phosphatase: 53 U/L (ref 38–126)
Anion gap: 18 — ABNORMAL HIGH (ref 5–15)
BUN: 18 mg/dL (ref 8–23)
CO2: 28 mmol/L (ref 22–32)
Calcium: 9.1 mg/dL (ref 8.9–10.3)
Chloride: 92 mmol/L — ABNORMAL LOW (ref 98–111)
Creatinine, Ser: 4.9 mg/dL — ABNORMAL HIGH (ref 0.44–1.00)
GFR, Estimated: 9 mL/min — ABNORMAL LOW (ref >60)
Glucose, Bld: 131 mg/dL — ABNORMAL HIGH (ref 70–99)
Potassium: 3.2 mmol/L — ABNORMAL LOW (ref 3.5–5.1)
Sodium: 138 mmol/L (ref 135–145)
Total Bilirubin: 0.7 mg/dL (ref 0.0–1.2)
Total Protein: 7.2 g/dL (ref 6.5–8.1)

## 2024-02-18 LAB — CBC WITH DIFFERENTIAL/PLATELET
Abs Immature Granulocytes: 0.15 10*3/uL — ABNORMAL HIGH (ref 0.00–0.07)
Basophils Absolute: 0.1 10*3/uL (ref 0.0–0.1)
Basophils Relative: 1 %
Eosinophils Absolute: 0.2 10*3/uL (ref 0.0–0.5)
Eosinophils Relative: 1 %
HCT: 27.9 % — ABNORMAL LOW (ref 36.0–46.0)
Hemoglobin: 9.2 g/dL — ABNORMAL LOW (ref 12.0–15.0)
Immature Granulocytes: 1 %
Lymphocytes Relative: 8 %
Lymphs Abs: 1 10*3/uL (ref 0.7–4.0)
MCH: 33.5 pg (ref 26.0–34.0)
MCHC: 33 g/dL (ref 30.0–36.0)
MCV: 101.5 fL — ABNORMAL HIGH (ref 80.0–100.0)
Monocytes Absolute: 0.8 10*3/uL (ref 0.1–1.0)
Monocytes Relative: 7 %
Neutro Abs: 10.2 10*3/uL — ABNORMAL HIGH (ref 1.7–7.7)
Neutrophils Relative %: 82 %
Platelets: 288 10*3/uL (ref 150–400)
RBC: 2.75 MIL/uL — ABNORMAL LOW (ref 3.87–5.11)
RDW: 13.6 % (ref 11.5–15.5)
WBC: 12.3 10*3/uL — ABNORMAL HIGH (ref 4.0–10.5)
nRBC: 0 % (ref 0.0–0.2)

## 2024-02-18 LAB — MAGNESIUM: Magnesium: 2.2 mg/dL (ref 1.7–2.4)

## 2024-02-18 LAB — CBG MONITORING, ED: Glucose-Capillary: 119 mg/dL — ABNORMAL HIGH (ref 70–99)

## 2024-02-18 MED ORDER — LORAZEPAM 2 MG/ML IJ SOLN
0.5000 mg | Freq: Once | INTRAMUSCULAR | Status: AC
  Administered 2024-02-18: 0.5 mg via INTRAVENOUS
  Filled 2024-02-18: qty 1

## 2024-02-18 MED ORDER — LEVETIRACETAM IN NACL 1500 MG/100ML IV SOLN
1500.0000 mg | Freq: Once | INTRAVENOUS | Status: DC
Start: 2024-02-19 — End: 2024-02-18

## 2024-02-18 MED ORDER — SODIUM CHLORIDE 0.9 % IV SOLN
200.0000 mg | Freq: Once | INTRAVENOUS | Status: AC
  Administered 2024-02-18: 200 mg via INTRAVENOUS
  Filled 2024-02-18: qty 20

## 2024-02-18 NOTE — ED Notes (Signed)
 IV attempted with ultrasound, IV team consult order will be placed. EDP Madilyn Hook notified.

## 2024-02-18 NOTE — ED Triage Notes (Signed)
 Pt placed on 2l [place] for o2 sat of 86% on ra

## 2024-02-18 NOTE — ED Notes (Signed)
 Nakoma Gotwalt [phone] would like an update immediately

## 2024-02-18 NOTE — ED Triage Notes (Signed)
 Pt coming in from dialysis . Pt had finished full dialysis  Pt had a tonic colonic seizure for 1 to 2 min.  Pt was unresponsive for ems when they got there   Dialysis reports that she has had seizures at  dialysis previously and is post ictal for one to two hours after. Pt son is resistant to seizure diagnosis. Minimally confused at baseline.    100% on 2L  Cbg 149

## 2024-02-18 NOTE — ED Provider Notes (Signed)
 [place] EMERGENCY DEPARTMENT AT [facility] Provider Note   CSN: [id number] Arrival date & time: 02/18/24  1459     History {Add pertinent medical, surgical, social history, OB history to HPI:1} Chief Complaint  Patient presents with   Seizures    Kirsten Mcmahon is a 78 y.o. female.  The history is provided by the EMS personnel.  Seizures Kirsten Mcmahon is a 78 y.o. female who presents to the Emergency Department complaining of seizure.  Level 5 caveat due to altered mental status.  History is provided by EMS.  EMS reports that she was at hemodialysis today and completed her full session.  When the session was getting discontinued she had 1 to 2 minutes of generalized tonic-clonic seizure activity.  She was postictal on EMS arrival.  She has baseline left hemiparesis.      Home Medications Prior to Admission medications   Medication Sig Start Date End Date Taking? Authorizing Provider  acetaminophen (TYLENOL) 500 MG tablet Take 500-1,000 mg by mouth daily as needed for moderate pain (pain score 4-6), fever or headache.    [provider]  anastrozole (ARIMIDEX) 1 MG tablet Take 1 tablet (1 mg total) by mouth daily. 01/05/24   Serena Croissant, MD  aspirin EC 81 MG tablet Take 81 mg by mouth daily.    [provider]  B Complex-C-Zn-Folic Acid (DIALYVITE 800-ZINC 15) 0.8 MG TABS Take 1 tablet by mouth daily.    [provider]  bisacodyl (DULCOLAX) 10 MG suppository Place 1 suppository (10 mg total) rectally as needed for moderate constipation. 02/14/24   Jerald Kief, MD  calcitRIOL (ROCALTROL) 0.5 MCG capsule Take 1 capsule (0.5 mcg total) by mouth every Monday, Wednesday, and Friday with hemodialysis. 07/31/21   Rolly Salter, MD  docusate sodium (COLACE) 100 MG capsule Take 1 capsule (100 mg total) by mouth 2 (two) times daily. Patient taking differently: Take 100 mg by mouth every other day. 06/06/23   Rolly Salter, MD  donepezil  (ARICEPT) 10 MG tablet Take 10 mg by mouth at bedtime. 06/28/22   [provider]  HYDROmorphone (DILAUDID) 2 MG tablet Take 0.5 tablets (1 mg total) by mouth every 6 (six) hours as needed for severe pain (pain score 7-10). 02/13/24   Jerald Kief, MD  lacosamide 150 MG TABS Take 1 tablet (150 mg total) by mouth 2 (two) times daily. Patient not taking: Reported on 02/07/2024 06/04/23   Rolly Salter, MD  latanoprost (XALATAN) 0.005 % ophthalmic solution Place 1 drop into both eyes at bedtime. 09/07/18   Tommie Sams, DO  levothyroxine (SYNTHROID, LEVOTHROID) 112 MCG tablet Take 1 tablet (112 mcg total) by mouth daily before breakfast. 09/07/18   Tommie Sams, DO  Methoxy PEG-Epoetin Beta (MIRCERA IJ) See admin instructions. 2 times a month at dialysis 08/10/23 08/08/24  [provider]  polyethylene glycol powder (GLYCOLAX/MIRALAX) 17 GM/SCOOP powder Take 17 g by mouth daily. 02/14/24   Jerald Kief, MD  senna (SENOKOT) 8.6 MG TABS tablet Take 1 tablet (8.6 mg total) by mouth daily. 02/14/24   Jerald Kief, MD  sucroferric oxyhydroxide (VELPHORO) 500 MG chewable tablet Chew 500 mg by mouth 3 (three) times daily with meals. 10/13/21   [provider]      Allergies    Aricept [donepezil], Contrast media [iodinated contrast media], Latex, Shellfish allergy, and Levemir [insulin detemir]    Review of Systems   Review of Systems  Neurological:  Positive for seizures.  All other systems reviewed and are negative.   Physical Exam Updated Vital Signs BP (!) 155/76   Pulse (!) 102   Temp 98 F (36.7 C) (Axillary)   Resp 17   Ht 5\' 4"  (1.626 m)   Wt 60 kg   SpO2 98%   BMI 22.71 kg/m  Physical Exam Vitals and nursing note reviewed.  Constitutional:      Appearance: She is well-developed.  HENT:     Head: Normocephalic and atraumatic.  Cardiovascular:     Rate and Rhythm: Normal rate and regular rhythm.     Heart sounds: No murmur heard. Pulmonary:      Effort: Pulmonary effort is normal. No respiratory distress.     Breath sounds: Normal breath sounds.  Abdominal:     Palpations: Abdomen is soft.     Tenderness: There is no abdominal tenderness. There is no guarding or rebound.  Musculoskeletal:     Comments: Cast to the left upper arm.  Skin:    General: Skin is warm and dry.  Neurological:     Mental Status: She is alert.     Comments: Nonverbal.  Right sided gaze preference.  Left upper extremity and left lower extremity with 0 out of 5 strength.  She does spontaneously move the right upper extremity and right lower extremity but does not follow commands.   Psychiatric:        Behavior: Behavior normal.     ED Results / Procedures / Treatments   Labs (all labs ordered are listed, but only abnormal results are displayed) Labs Reviewed  COMPREHENSIVE METABOLIC PANEL  CBC WITH DIFFERENTIAL/PLATELET  MAGNESIUM  CBG MONITORING, ED    EKG None  Radiology No results found.  Procedures Procedures  {Document cardiac monitor, telemetry assessment procedure when appropriate:1}  Medications Ordered in ED Medications  lacosamide (VIMPAT) 200 mg in sodium chloride 0.9 % 25 mL IVPB (has no administration in time range)    ED Course/ Medical Decision Making/ A&P   {   Click here for ABCD2, HEART and other calculatorsREFRESH Note before signing :1}                              Medical Decision Making Amount and/or Complexity of Data Reviewed Labs: ordered. Radiology: ordered.   ***  {Document critical care time when appropriate:1} {Document review of labs and clinical decision tools ie heart score, Chads2Vasc2 etc:1}  {Document your independent review of radiology images, and any outside records:1} {Document your discussion with family members, caretakers, and with consultants:1} {Document social determinants of health affecting pt's care:1} {Document your decision making why or why not admission, treatments were  needed:1} Final Clinical Impression(s) / ED Diagnoses Final diagnoses:  None    Rx / DC Orders ED Discharge Orders     None

## 2024-02-18 NOTE — Progress Notes (Signed)
 Received IV consult for PIV start. Patient has right upper arm graft and cast on fractured left arm. Unable to place peripheral IV. Primary RN aware.

## 2024-02-28 ENCOUNTER — Ambulatory Visit: Admitting: Orthopaedic Surgery

## 2024-03-01 ENCOUNTER — Ambulatory Visit: Admitting: Orthopedic Surgery

## 2024-03-13 ENCOUNTER — Encounter: Payer: Self-pay | Admitting: Surgery

## 2024-03-14 ENCOUNTER — Ambulatory Visit
Admission: RE | Admit: 2024-03-14 | Discharge: 2024-03-14 | Disposition: A | Discharge status: Home / Self Care | Source: Ambulatory Visit | Attending: Surgery | Admitting: Surgery

## 2024-03-14 ENCOUNTER — Other Ambulatory Visit: Payer: Self-pay | Admitting: Surgery

## 2024-03-14 DIAGNOSIS — M25522 Pain in left elbow: Secondary | ICD-10-CM

## 2024-03-15 ENCOUNTER — Ambulatory Visit
Admission: RE | Admit: 2024-03-15 | Discharge: 2024-03-15 | Disposition: A | Discharge status: Home / Self Care | Source: Ambulatory Visit | Attending: Surgery | Admitting: Surgery

## 2024-03-15 ENCOUNTER — Other Ambulatory Visit: Payer: Self-pay | Admitting: Surgery

## 2024-03-15 ENCOUNTER — Encounter: Payer: Self-pay | Admitting: Surgery

## 2024-03-15 DIAGNOSIS — M25512 Pain in left shoulder: Secondary | ICD-10-CM

## 2024-03-20 ENCOUNTER — Ambulatory Visit

## 2024-03-20 NOTE — Therapy (Deleted)
 OUTPATIENT SPEECH LANGUAGE PATHOLOGY SWALLOW EVALUATION   Patient Name: Kirsten Mcmahon MRN: [id number] DOB:[birth year], 78 y.o., female Today's Date: 03/20/2024  PCP: [facility]  REFERRING PROVIDER: Dominica Severin, PA-C  END OF SESSION:   Past Medical History:  Diagnosis Date   Anemia    Cancer (HCC) 2017   Right breast   ESRD on hemodialysis (HCC)    GERD (gastroesophageal reflux disease)    Glaucoma    Hemiparesis (HCC)    left side   High cholesterol    History of seizure    after a spider bite; 07/15/21   History of stroke with residual deficit    left-side weakness   Hypertension    states BP under control with meds., has been on med. x 2 yr.   Hypothyroidism    Non-insulin dependent type 2 diabetes mellitus (HCC)    Overactive bladder    PFO (patent foramen ovale) 05/15/2021   Stroke (HCC)    1998 weakness on left side; 05/12/21   Past Surgical History:  Procedure Laterality Date   ABDOMINAL HYSTERECTOMY     complete   AV FISTULA PLACEMENT Right 09/29/2021   Procedure: INSERTION OF RIGHT ARM ARTERIOVENOUS (AV) GORE-TEX GRAFT;  Surgeon: Chuck Hint, MD;  Location: [facility] OR;  Service: Vascular;  Laterality: Right;   BREAST BIOPSY Left 08/03/2018   Benign adipose tissue   BREAST BIOPSY Right 02/15/2023   [place] RT BREAST BX W LOC DEV 1ST LESION IMG BX SPEC US GUIDE 02/15/2023 [facility]   BREAST BIOPSY Left 02/15/2023   [place] LT BREAST BX W LOC DEV 1ST LESION IMG BX SPEC US GUIDE 02/15/2023 [facility]   BREAST EXCISIONAL BIOPSY Right 2014   Positive   BREAST LUMPECTOMY Right    BUBBLE STUDY  05/15/2021   Procedure: BUBBLE STUDY;  Surgeon: Jake Bathe, MD;  Location: [facility] ENDOSCOPY;  Service: Cardiovascular;;   CATARACT EXTRACTION W/ INTRAOCULAR LENS IMPLANT Left    CEREBRAL ANEURYSM REPAIR  1998   DIALYSIS/PERMA CATHETER INSERTION N/A 04/10/2021   Procedure: DIALYSIS/PERMA CATHETER INSERTION;  Surgeon: Annice Needy, MD;  Location:  [facility] INVASIVE CV LAB;  Service: Cardiovascular;  Laterality: N/A;   IR FLUORO GUIDE CV LINE RIGHT  04/30/2021   MASTECTOMY MODIFIED RADICAL Right 12/08/2023   Procedure: RIGHT MODIFIED RADICAL MASTECTOMY;  Surgeon: Griselda Miner, MD;  Location: [facility] ORS;  Service: General;  Laterality: Right;   PICC LINE INSERTION     TEE WITHOUT CARDIOVERSION N/A 05/15/2021   Procedure: TRANSESOPHAGEAL ECHOCARDIOGRAM (TEE);  Surgeon: Jake Bathe, MD;  Location[facility] END[facility];  Service: Cardiovascular;  Laterality: N/A;   THYROID LOBECTOMY Right 07/15/2017   Procedure: RIGHT THYROID LOBECTOMY;  Surgeon: Darnell Level, MD;  Location: [facility] OR;  Service: General;  Laterality: Right;   Patient Active Problem List   Diagnosis Date Noted   Encephalopathy acute 02/07/2024   Acute cystitis without hematuria 02/07/2024   ABLA (acute blood loss anemia) 02/07/2024   Closed fracture of left distal humerus 02/07/2024   Cancer of right female breast (HCC) 12/08/2023   Seizures (HCC) 05/31/2023   History of breast cancer 05/31/2023   History of CVA (cerebrovascular accident) 05/31/2023   Delirium due to known physiological condition 08/03/2021   Seizure (HCC) 07/17/2021   Mixed diabetic hyperlipidemia associated with type 2 diabetes mellitus (HCC) 07/16/2021   Recurrent seizures (HCC) 07/15/2021   Encounter for immunization 07/14/2021   Pain, unspecified 07/13/2021   Hypokalemia 06/02/2021   Other  pneumonia, unspecified organism 05/26/2021   Recent cerebrovascular accident (CVA) 05/17/2021   History of CVA with residual deficit 05/17/2021   Cerebral embolism with cerebral infarction 05/13/2021   Physical deconditioning 04/30/2021   Generalized muscle weakness 04/30/2021   Pressure injury of skin 04/29/2021   AKI (acute kidney injury) (HCC) 04/28/2021   Moderate protein-calorie malnutrition (HCC) 04/28/2021   Other bacterial infections of unspecified site 04/28/2021   Streptococcal sepsis, unspecified (HCC) 04/18/2021    Streptococcal bacteremia 04/18/2021   Leukocytosis 04/17/2021   Fever 04/17/2021   Allergy, unspecified, initial encounter 04/16/2021   Anaphylactic shock, unspecified, initial encounter 04/16/2021   Complication of vascular dialysis catheter 04/16/2021   Iron deficiency anemia, unspecified 04/16/2021   Pruritus, unspecified 04/16/2021   Secondary hyperparathyroidism of renal origin (HCC) 04/16/2021   Type 2 diabetes mellitus with diabetic peripheral angiopathy without gangrene (HCC) 04/16/2021   End-stage renal disease on hemodialysis (HCC)    Acute kidney injury superimposed on CKD (HCC) 04/09/2021   ARF (acute renal failure) (HCC) 01/18/2021   Fall 01/17/2021   Essential hypertension    Hypothyroidism    Stroke (HCC)    GERD (gastroesophageal reflux disease)    Anemia in chronic kidney disease (CKD)    Acute metabolic encephalopathy    Acute renal failure superimposed on stage 3b chronic kidney disease (HCC)    Type 2 diabetes mellitus with ESRD (end-stage renal disease) (HCC)    Abnormal mammogram of left breast 07/30/2018   Closed displaced oblique fracture of shaft of left humerus 03/14/2018   Osteopenia 01/20/2018   Multiple thyroid nodules 07/13/2017   Tracheal deviation 07/13/2017   Malignant neoplasm of upper-outer quadrant of right breast in female, estrogen receptor positive (HCC) 01/20/2017   Primary vulvar squamous cell carcinoma (HCC) 01/20/2017    ONSET DATE: 02/23/2024 (referral date)   REFERRING DIAG: R13.10 (ICD-10-CM) - Dysphagia, unspecified  THERAPY DIAG:  No diagnosis found.  Rationale for Evaluation and Treatment: Rehabilitation  SUBJECTIVE:   SUBJECTIVE STATEMENT: *** Pt accompanied by: {accompnied:27141}  PERTINENT HISTORY: "Patient has a history of end-stage renal disease on hemodialysis, seizure disorder, dementia multiple prior CVAs with left hemiparesis. She has been hospitalized twice this month. On 02/06/2024 she was admitted until  02/13/2024 for acute encephalopathy complicated by a UTI. She was taken to [facility] via EMS and rehospitalized 02/18/2024 after a reported 1 to 2-minute tonic-clonic seizure. MRI showed chronic findings. CBC demonstrated stable anemia and leukocytosis. Patient was discharged home same day with orders to continue Vimpat with close neurology follow-up.  Patient's family has noticed a decrease in her cognition since she was hospitalized earlier this month. Her son reports that her cognition has decreased severely in the past few weeks, stating that she does not have conversations like she did previously. Patient's daughter-in-law states that the patient has began spitting her pills out of her mouth after letting them partially dissolve. She has stopped eating most solid foods. Upon questioning, the patient endorses difficulty swallowing water but is able to swallow milk. The family is unable to comment on this. The patient states that she takes all of her medicine easily, but the daughter-in-law protest. The patient feels she eats regularly and does not have difficulty ingesting food. She does not feel that she has too many pills to take. Daughter-in-law states that the patient takes 1 pill at a time. They have not tried mixing the pills with soft foods. Her son is concerned that the patient is not eating regularly. They have began  giving her nutrition shakes, but is worried that this will interfere with her dialysis because of the added vitamins"  PAIN:  Are you having pain? {OPRCPAIN:27236}  FALLS: Has patient fallen in last 6 months?  {ZOXWRUEA:54098}  LIVING ENVIRONMENT: Lives with: lives with their family Lives in: House/apartment  PLOF:  Level of assistance: Needed assistance with ADLs, Needed assistance with IADLS Employment: Retired  PATIENT GOALS: ***  OBJECTIVE:  Note: Objective measures were completed at Evaluation unless otherwise noted. OBJECTIVE:   DIAGNOSTIC FINDINGS:  ***  INSTRUMENTAL SWALLOW STUDY FINDINGS ({mbss fees:29767}) *** Objective swallow impairments: *** Objective recommended compensations: ***  COGNITION: Overall cognitive status: {cognition:24006} Areas of impairment:  {cognitiveimpairmentslp:27409} Functional deficits: ***  SUBJECTIVE DYSPHAGIA REPORTS:  Date of onset: *** Reported symptoms: {dysphagia symptoms:29766}  Current diet: {slpdiet:27196}  Co-morbid voice changes: {yes/no:20286}  FACTORS WHICH MAY INCREASE RISK OF ADVERSE EVENT IN PRESENCE OF ASPIRATION:  General health: {CSE general health:29764}  Risk factors: {CSE risk factors:29763}    ORAL MOTOR EXAMINATION: Overall status: {OMESLP2:27645} Comments: ***  CLINICAL SWALLOW ASSESSMENT:   Dentition: {CSE dentition:29769} Vocal quality at baseline: {VQL:27192} Patient directly observed with POs: {POobserved:27199} Feeding: {slp feeding:27200} Liquids provided by: {SLPliquids:27201} [facility] Swallow Protocol: {Pass/Fail (Optional):210140017} Oral phase signs and symptoms: {SLPoralphase:27202} Pharyngeal phase signs and symptoms: {SLPpharyngealphase:27203}  PATIENT REPORTED OUTCOME MEASURES (PROM): {SLPPROM:27095}                                                                                                                             TREATMENT DATE: ***   PATIENT EDUCATION: Education details: *** Person educated: {Person educated:25204} Education method: {Education Method:25205} Education comprehension: {Education Comprehension:25206}   ASSESSMENT:  CLINICAL IMPRESSION: Patient is a *** y.o. *** who was seen today for ***.   OBJECTIVE IMPAIRMENTS: include {SLPOBJIMP:27107}. These impairments are limiting patient from {SLPLIMIT:27108}. Factors affecting potential to achieve goals and functional outcome are {SLP potential:25450}. Patient will benefit from skilled SLP services to address above impairments and improve overall function.  REHAB POTENTIAL:  {rehabpotential:25112}   GOALS: Goals reviewed with patient? {yes/no:20286}  SHORT TERM GOALS: Target date: ***  *** Baseline: Goal status: INITIAL  2.  *** Baseline:  Goal status: INITIAL  3.  *** Baseline:  Goal status: INITIAL  4.  *** Baseline:  Goal status: INITIAL  5.  *** Baseline:  Goal status: INITIAL  6.  *** Baseline:  Goal status: INITIAL  LONG TERM GOALS: Target date: ***  *** Baseline:  Goal status: INITIAL  2.  *** Baseline:  Goal status: INITIAL  3.  *** Baseline:  Goal status: INITIAL  4.  *** Baseline:  Goal status: INITIAL  5.  *** Baseline:  Goal status: INITIAL  6.  *** Baseline:  Goal status: INITIAL  PLAN:  SLP FREQUENCY: {rehab frequency:25116}  SLP DURATION: {rehab duration:25117}  PLANNED INTERVENTIONS: {SLP treatment/interventions:25449}    Gracy Racer, CCC-SLP 03/20/2024, 10:32 AM

## 2024-03-27 ENCOUNTER — Ambulatory Visit (HOSPITAL_COMMUNITY): Admission: RE | Admit: 2024-03-27 | Payer: 59 | Source: Ambulatory Visit

## 2024-04-23 ENCOUNTER — Ambulatory Visit

## 2024-04-23 NOTE — Therapy (Deleted)
 OUTPATIENT SPEECH LANGUAGE PATHOLOGY SWALLOW EVALUATION   Patient Name: Kirsten Mcmahon MRN: [id number] DOB:20-Apr-1946, 78 y.o., female Today's Date: 04/23/2024  PCP: [facility]  REFERRING PROVIDER: Diantha Fossa, PA-C  END OF SESSION:   Past Medical History:  Diagnosis Date   Anemia    Cancer (HCC) 2017   Right breast   ESRD on hemodialysis (HCC)    GERD (gastroesophageal reflux disease)    Glaucoma    Hemiparesis (HCC)    left side   High cholesterol    History of seizure    after a spider bite; 07/15/21   History of stroke with residual deficit    left-side weakness   Hypertension    states BP under control with meds., has been on med. x 2 yr.   Hypothyroidism    Non-insulin  dependent type 2 diabetes mellitus (HCC)    Overactive bladder    PFO (patent foramen ovale) 05/15/2021   Stroke (HCC)    1998 weakness on left side; 05/12/21   Past Surgical History:  Procedure Laterality Date   ABDOMINAL HYSTERECTOMY     complete   AV FISTULA PLACEMENT Right 09/29/2021   Procedure: INSERTION OF RIGHT ARM ARTERIOVENOUS (AV) GORE-TEX GRAFT;  Surgeon: Dannis Dy, MD;  Location: [facility] OR;  Service: Vascular;  Laterality: Right;   BREAST BIOPSY Left 08/03/2018   Benign adipose tissue   BREAST BIOPSY Right 02/15/2023   US  RT BREAST BX W LOC DEV 1ST LESION IMG BX SPEC US  GUIDE 02/15/2023 [facility]   BREAST BIOPSY Left 02/15/2023   US  LT BREAST BX W LOC DEV 1ST LESION IMG BX SPEC US  GUIDE 02/15/2023 [facility]   BREAST EXCISIONAL BIOPSY Right 2014   Positive   BREAST LUMPECTOMY Right    BUBBLE STUDY  05/15/2021   Procedure: BUBBLE STUDY;  Surgeon: Hugh Madura, MD;  Location: [facility] ENDOSCOPY;  Service: Cardiovascular;;   CATARACT EXTRACTION W/ INTRAOCULAR LENS IMPLANT Left    CEREBRAL ANEURYSM REPAIR  1998   DIALYSIS/PERMA CATHETER INSERTION N/A 04/10/2021   Procedure: DIALYSIS/PERMA CATHETER INSERTION;  Surgeon: Celso College, MD;  Location:  [facility] INVASIVE CV LAB;  Service: Cardiovascular;  Laterality: N/A;   IR FLUORO GUIDE CV LINE RIGHT  04/30/2021   MASTECTOMY MODIFIED RADICAL Right 12/08/2023   Procedure: RIGHT MODIFIED RADICAL MASTECTOMY;  Surgeon: Caralyn Chandler, MD;  Location: [facility] ORS;  Service: General;  Laterality: Right;   PICC LINE INSERTION     TEE WITHOUT CARDIOVERSION N/A 05/15/2021   Procedure: TRANSESOPHAGEAL ECHOCARDIOGRAM (TEE);  Surgeon: Hugh Madura, MD;  Location: [facility] ENDOSCOPY;  Service: Cardiovascular;  Laterality: N/A;   THYROID  LOBECTOMY Right 07/15/2017   Procedure: RIGHT THYROID  LOBECTOMY;  Surgeon: Oralee Billow, MD;  Location: [facility] OR;  Service: General;  Laterality: Right;   Patient Active Problem List   Diagnosis Date Noted   Encephalopathy acute 02/07/2024   Acute cystitis without hematuria 02/07/2024   ABLA (acute blood loss anemia) 02/07/2024   Closed fracture of left distal humerus 02/07/2024   Cancer of right female breast (HCC) 12/08/2023   Seizures (HCC) 05/31/2023   History of breast cancer 05/31/2023   History of CVA (cerebrovascular accident) 05/31/2023   Delirium due to known physiological condition 08/03/2021   Seizure (HCC) 07/17/2021   Mixed diabetic hyperlipidemia associated with type 2 diabetes mellitus (HCC) 07/16/2021   Recurrent seizures (HCC) 07/15/2021   Encounter for immunization 07/14/2021   Pain, unspecified 07/13/2021   Hypokalemia 06/02/2021   Other  pneumonia, unspecified organism 05/26/2021   Recent cerebrovascular accident (CVA) 05/17/2021   History of CVA with residual deficit 05/17/2021   Cerebral embolism with cerebral infarction 05/13/2021   Physical deconditioning 04/30/2021   Generalized muscle weakness 04/30/2021   Pressure injury of skin 04/29/2021   AKI (acute kidney injury) (HCC) 04/28/2021   Moderate protein-calorie malnutrition (HCC) 04/28/2021   Other bacterial infections of unspecified site 04/28/2021   Streptococcal sepsis, unspecified (HCC) 04/18/2021    Streptococcal bacteremia 04/18/2021   Leukocytosis 04/17/2021   Fever 04/17/2021   Allergy, unspecified, initial encounter 04/16/2021   Anaphylactic shock, unspecified, initial encounter 04/16/2021   Complication of vascular dialysis catheter 04/16/2021   Iron deficiency anemia, unspecified 04/16/2021   Pruritus, unspecified 04/16/2021   Secondary hyperparathyroidism of renal origin (HCC) 04/16/2021   Type 2 diabetes mellitus with diabetic peripheral angiopathy without gangrene (HCC) 04/16/2021   End-stage renal disease on hemodialysis (HCC)    Acute kidney injury superimposed on CKD (HCC) 04/09/2021   ARF (acute renal failure) (HCC) 01/18/2021   Fall 01/17/2021   Essential hypertension    Hypothyroidism    Stroke (HCC)    GERD (gastroesophageal reflux disease)    Anemia in chronic kidney disease (CKD)    Acute metabolic encephalopathy    Acute renal failure superimposed on stage 3b chronic kidney disease (HCC)    Type 2 diabetes mellitus with ESRD (end-stage renal disease) (HCC)    Abnormal mammogram of left breast 07/30/2018   Closed displaced oblique fracture of shaft of left humerus 03/14/2018   Osteopenia 01/20/2018   Multiple thyroid  nodules 07/13/2017   Tracheal deviation 07/13/2017   Malignant neoplasm of upper-outer quadrant of right breast in female, estrogen receptor positive (HCC) 01/20/2017   Primary vulvar squamous cell carcinoma (HCC) 01/20/2017    ONSET DATE: 02/23/2024 (referral)  REFERRING DIAG: R13.10 (ICD-10-CM) - Dysphagia, unspecified  THERAPY DIAG:  No diagnosis found.  Rationale for Evaluation and Treatment: Rehabilitation  SUBJECTIVE:   SUBJECTIVE STATEMENT: *** Pt accompanied by: {accompnied:27141}  PERTINENT HISTORY: ***  PAIN:  Are you having pain? {OPRCPAIN:27236}  FALLS: Has patient fallen in last 6 months?  {ZOXWRUEA:54098}  LIVING ENVIRONMENT: Lives with: {OPRC lives with:25569::"lives with their family"} Lives in: {Lives  in:25570}  PLOF:  Level of assistance: {JXBJYNW:29562} Employment: {SLPemployment:25674}  PATIENT GOALS: ***  OBJECTIVE:  Note: Objective measures were completed at Evaluation unless otherwise noted. OBJECTIVE:   DIAGNOSTIC FINDINGS: ***  INSTRUMENTAL SWALLOW STUDY FINDINGS ({mbss fees:29767}) *** Objective swallow impairments: *** Objective recommended compensations: ***  COGNITION: Overall cognitive status: {cognition:24006} Areas of impairment:  {cognitiveimpairmentslp:27409} Functional deficits: ***  SUBJECTIVE DYSPHAGIA REPORTS:  Date of onset: *** Reported symptoms: {dysphagia symptoms:29766}  Current diet: {slpdiet:27196}  Co-morbid voice changes: {yes/no:20286}  FACTORS WHICH MAY INCREASE RISK OF ADVERSE EVENT IN PRESENCE OF ASPIRATION:  General health: {CSE general health:29764}  Risk factors: {CSE risk factors:29763}    ORAL MOTOR EXAMINATION: Overall status: {OMESLP2:27645} Comments: ***  CLINICAL SWALLOW ASSESSMENT:   Dentition: {CSE dentition:29769} Vocal quality at baseline: {VQL:27192} Patient directly observed with POs: {POobserved:27199} Feeding: {slp feeding:27200} Liquids provided by: {SLPliquids:27201} [facility] Swallow Protocol: {Pass/Fail (Optional):210140017} Oral phase signs and symptoms: {SLPoralphase:27202} Pharyngeal phase signs and symptoms: {SLPpharyngealphase:27203}  PATIENT REPORTED OUTCOME MEASURES (PROM): {SLPPROM:27095}  TREATMENT DATE: ***   PATIENT EDUCATION: Education details: *** Person educated: {Person educated:25204} Education method: {Education Method:25205} Education comprehension: {Education Comprehension:25206}   ASSESSMENT:  CLINICAL IMPRESSION: Patient is a *** y.o. *** who was seen today for ***.   OBJECTIVE IMPAIRMENTS: include {SLPOBJIMP:27107}. These impairments are limiting patient  from {SLPLIMIT:27108}. Factors affecting potential to achieve goals and functional outcome are {SLP potential:25450}. Patient will benefit from skilled SLP services to address above impairments and improve overall function.  REHAB POTENTIAL: {rehabpotential:25112}   GOALS: Goals reviewed with patient? {yes/no:20286}  SHORT TERM GOALS: Target date: ***  *** Baseline: Goal status: INITIAL  2.  *** Baseline:  Goal status: INITIAL  3.  *** Baseline:  Goal status: INITIAL  4.  *** Baseline:  Goal status: INITIAL  5.  *** Baseline:  Goal status: INITIAL  6.  *** Baseline:  Goal status: INITIAL  LONG TERM GOALS: Target date: ***  *** Baseline:  Goal status: INITIAL  2.  *** Baseline:  Goal status: INITIAL  3.  *** Baseline:  Goal status: INITIAL  4.  *** Baseline:  Goal status: INITIAL  5.  *** Baseline:  Goal status: INITIAL  6.  *** Baseline:  Goal status: INITIAL  PLAN:  SLP FREQUENCY: {rehab frequency:25116}  SLP DURATION: {rehab duration:25117}  PLANNED INTERVENTIONS: {SLP treatment/interventions:25449}    Tamar Fairly, CCC-SLP 04/23/2024, 7:56 AM

## 2024-05-14 ENCOUNTER — Other Ambulatory Visit: Payer: Self-pay

## 2024-05-14 ENCOUNTER — Emergency Department (HOSPITAL_COMMUNITY)

## 2024-05-14 ENCOUNTER — Emergency Department (HOSPITAL_COMMUNITY)
Admission: EM | Admit: 2024-05-14 | Discharge: 2024-05-14 | Disposition: A | Discharge status: Home / Self Care | Attending: Emergency Medicine | Admitting: Emergency Medicine

## 2024-05-14 DIAGNOSIS — S42202G Unspecified fracture of upper end of left humerus, subsequent encounter for fracture with delayed healing: Secondary | ICD-10-CM

## 2024-05-14 DIAGNOSIS — X58XXXD Exposure to other specified factors, subsequent encounter: Secondary | ICD-10-CM | POA: Insufficient documentation

## 2024-05-14 DIAGNOSIS — N186 End stage renal disease: Secondary | ICD-10-CM | POA: Insufficient documentation

## 2024-05-14 DIAGNOSIS — Z992 Dependence on renal dialysis: Secondary | ICD-10-CM | POA: Insufficient documentation

## 2024-05-14 DIAGNOSIS — G40919 Epilepsy, unspecified, intractable, without status epilepticus: Secondary | ICD-10-CM | POA: Insufficient documentation

## 2024-05-14 DIAGNOSIS — S42202D Unspecified fracture of upper end of left humerus, subsequent encounter for fracture with routine healing: Secondary | ICD-10-CM | POA: Diagnosis not present

## 2024-05-14 DIAGNOSIS — Z7982 Long term (current) use of aspirin: Secondary | ICD-10-CM | POA: Insufficient documentation

## 2024-05-14 DIAGNOSIS — Z91040 Latex allergy status: Secondary | ICD-10-CM | POA: Insufficient documentation

## 2024-05-14 DIAGNOSIS — Z8673 Personal history of transient ischemic attack (TIA), and cerebral infarction without residual deficits: Secondary | ICD-10-CM | POA: Insufficient documentation

## 2024-05-14 LAB — CBC WITH DIFFERENTIAL/PLATELET
Abs Immature Granulocytes: 0.18 10*3/uL — ABNORMAL HIGH (ref 0.00–0.07)
Basophils Absolute: 0.1 10*3/uL (ref 0.0–0.1)
Basophils Relative: 0 %
Eosinophils Absolute: 0.2 10*3/uL (ref 0.0–0.5)
Eosinophils Relative: 1 %
HCT: 26.9 % — ABNORMAL LOW (ref 36.0–46.0)
Hemoglobin: 8.7 g/dL — ABNORMAL LOW (ref 12.0–15.0)
Immature Granulocytes: 1 %
Lymphocytes Relative: 6 %
Lymphs Abs: 1.2 10*3/uL (ref 0.7–4.0)
MCH: 33.6 pg (ref 26.0–34.0)
MCHC: 32.3 g/dL (ref 30.0–36.0)
MCV: 103.9 fL — ABNORMAL HIGH (ref 80.0–100.0)
Monocytes Absolute: 1.5 10*3/uL — ABNORMAL HIGH (ref 0.1–1.0)
Monocytes Relative: 9 %
Neutro Abs: 14.9 10*3/uL — ABNORMAL HIGH (ref 1.7–7.7)
Neutrophils Relative %: 83 %
Platelets: 264 10*3/uL (ref 150–400)
RBC: 2.59 MIL/uL — ABNORMAL LOW (ref 3.87–5.11)
RDW: 13 % (ref 11.5–15.5)
WBC: 18 10*3/uL — ABNORMAL HIGH (ref 4.0–10.5)
nRBC: 0 % (ref 0.0–0.2)

## 2024-05-14 LAB — COMPREHENSIVE METABOLIC PANEL WITH GFR
ALT: 16 U/L (ref 0–44)
AST: 30 U/L (ref 15–41)
Albumin: 3.4 g/dL — ABNORMAL LOW (ref 3.5–5.0)
Alkaline Phosphatase: 60 U/L (ref 38–126)
Anion gap: 15 (ref 5–15)
BUN: 19 mg/dL (ref 8–23)
CO2: 27 mmol/L (ref 22–32)
Calcium: 9 mg/dL (ref 8.9–10.3)
Chloride: 97 mmol/L — ABNORMAL LOW (ref 98–111)
Creatinine, Ser: 5.45 mg/dL — ABNORMAL HIGH (ref 0.44–1.00)
GFR, Estimated: 7 mL/min — ABNORMAL LOW (ref >60)
Glucose, Bld: 169 mg/dL — ABNORMAL HIGH (ref 70–99)
Potassium: 3.7 mmol/L (ref 3.5–5.1)
Sodium: 139 mmol/L (ref 135–145)
Total Bilirubin: 0.5 mg/dL (ref 0.0–1.2)
Total Protein: 7.8 g/dL (ref 6.5–8.1)

## 2024-05-14 NOTE — ED Triage Notes (Signed)
 Patient arrives via [facility] for witnessed seizure after full dialysis treatment. 2 min seizure with tonic clonic movement. According to dialysis RN, unresponsive for 7 minutes with normal respirations. Postictal with EMS. Right arm restriction and left arm deficit from stroke. Not ambulatory at baseline.   EMS vitals 124/70 HR 100 O2 100 on room air CBG 200

## 2024-05-14 NOTE — ED Provider Notes (Signed)
 [place] EM[facility] DEPARTMENT AT [facility] Provider Note   CSN: [id number] Arrival date & time: 05/14/24  1603     History {Add pertinent medical, surgical, social history, OB history to HPI:1} Chief Complaint  Patient presents with   Seizures    Kirsten Mcmahon is a 78 y.o. female.  HPI    78 year old female  Home Medications Prior to Admission medications   Medication Sig Start Date End Date Taking? Authorizing Provider  acetaminophen  (TYLENOL ) 500 MG tablet Take 500-1,000 mg by mouth daily as needed for moderate pain (pain score 4-6), fever or headache.    [provider]  anastrozole  (ARIMIDEX ) 1 MG tablet Take 1 tablet (1 mg total) by mouth daily. 01/05/24   Cameron Cea, MD  aspirin  EC 81 MG tablet Take 81 mg by mouth daily.    [provider]  B Complex-C-Zn-Folic Acid  (DIALYVITE 800-ZINC 15) 0.8 MG TABS Take 1 tablet by mouth daily.    [provider]  bisacodyl  (DULCOLAX) 10 MG suppository Place 1 suppository (10 mg total) rectally as needed for moderate constipation. 02/14/24   Oral Billings, MD  calcitRIOL  (ROCALTROL ) 0.5 MCG capsule Take 1 capsule (0.5 mcg total) by mouth every Monday, Wednesday, and Friday with hemodialysis. 07/31/21   Kraig Peru, MD  docusate sodium  (COLACE) 100 MG capsule Take 1 capsule (100 mg total) by mouth 2 (two) times daily. Patient taking differently: Take 100 mg by mouth every other day. 06/06/23   Kraig Peru, MD  donepezil  (ARICEPT ) 10 MG tablet Take 10 mg by mouth at bedtime. 06/28/22   [provider]  HYDROmorphone  (DILAUDID ) 2 MG tablet Take 0.5 tablets (1 mg total) by mouth every 6 (six) hours as needed for severe pain (pain score 7-10). 02/13/24   Oral Billings, MD  lacosamide  150 MG TABS Take 1 tablet (150 mg total) by mouth 2 (two) times daily. Patient not taking: Reported on 02/07/2024 06/04/23   Kraig Peru, MD  latanoprost  (XALATAN ) 0.005 % ophthalmic solution Place 1  drop into both eyes at bedtime. 09/07/18   Cook, Jayce G, DO  levothyroxine  (SYNTHROID , LEVOTHROID) 112 MCG tablet Take 1 tablet (112 mcg total) by mouth daily before breakfast. 09/07/18   Cook, Jayce G, DO  Methoxy PEG-Epoetin  Beta (MIRCERA IJ) See admin instructions. 2 times a month at dialysis 08/10/23 08/08/24  [provider]  polyethylene glycol powder (GLYCOLAX /MIRALAX ) 17 GM/SCOOP powder Take 17 g by mouth daily. 02/14/24   Oral Billings, MD  senna (SENOKOT) 8.6 MG TABS tablet Take 1 tablet (8.6 mg total) by mouth daily. 02/14/24   Oral Billings, MD  sucroferric oxyhydroxide (VELPHORO ) 500 MG chewable tablet Chew 500 mg by mouth 3 (three) times daily with meals. 10/13/21   [provider]      Allergies    Aricept  [donepezil ], Contrast media [iodinated contrast media], Latex, Shellfish allergy, and Levemir [insulin  detemir]    Review of Systems   Review of Systems  Physical Exam Updated Vital Signs BP (!) 130/59   Pulse 92   Temp 98.4 F (36.9 C) (Axillary)   Resp 20   Ht 5\' 4"  (1.626 m)   Wt 60 kg   SpO2 100%   BMI 22.71 kg/m  Physical Exam  ED Results / Procedures / Treatments   Labs (all labs ordered are listed, but only abnormal results are displayed) Labs Reviewed  COMPREHENSIVE METABOLIC PANEL WITH GFR - Abnormal; Notable for the following components:  Result Value   Chloride 97 (*)    Glucose, Bld 169 (*)    Creatinine, Ser 5.45 (*)    Albumin  3.4 (*)    GFR, Estimated 7 (*)    All other components within normal limits  CBC WITH DIFFERENTIAL/PLATELET - Abnormal; Notable for the following components:   WBC 18.0 (*)    RBC 2.59 (*)    Hemoglobin 8.7 (*)    HCT 26.9 (*)    MCV 103.9 (*)    Neutro Abs 14.9 (*)    Monocytes Absolute 1.5 (*)    Abs Immature Granulocytes 0.18 (*)    All other components within normal limits  CBG MONITORING, ED    EKG EKG Interpretation Date/Time:  Monday May 14 2024 16:37:56 EDT Ventricular Rate:   96 PR Interval:  143 QRS Duration:  67 QT Interval:  435 QTC Calculation: 550 R Axis:      Text Interpretation: Normal sinus rhythm No acute changes No significant change since last tracing Confirmed by Deatra Face ([id number]) on 05/14/2024 6:07:58 PM  Radiology DG Humerus Left Result Date: 05/14/2024 CLINICAL DATA:  Status post fall. EXAM: LEFT HUMERUS - 2+ VIEW COMPARISON:  CT of the left shoulder 03/15/2024 and left elbow 03/14/2024. Radiographs 02/18/2024. FINDINGS: Two views are submitted with similar positioning. The bones are demineralized. There is significant posttraumatic deformity of the distal humeral diaphysis related to the previously demonstrated comminuted fracture. There is evidence for partial healing, although complete healing cannot be confirmed on this limited examination. No evidence of acute fracture or dislocation. Chronic flattening of the humeral head with chronic glenohumeral degenerative changes. IMPRESSION: Significant posttraumatic deformity of the distal humeral diaphysis related to the previously demonstrated comminuted fracture. Complete healing of this fracture cannot be confirmed on this limited examination. This could be further evaluated with CT if clinically warranted. No evidence of acute fracture or dislocation. Electronically Signed   By: Elmon Hagedorn M.D.   On: 05/14/2024 19:00    Procedures Procedures  {Document cardiac monitor, telemetry assessment procedure when appropriate:1}  Medications Ordered in ED Medications - No data to display  ED Course/ Medical Decision Making/ A&P   {   Click here for ABCD2, HEART and other calculatorsREFRESH Note before signing :1}                              Medical Decision Making Amount and/or Complexity of Data Reviewed Labs: ordered. Radiology: ordered.   ***  {Document critical care time when appropriate:1} {Document review of labs and clinical decision tools ie heart score, Chads2Vasc2 etc:1}   {Document your independent review of radiology images, and any outside records:1} {Document your discussion with family members, caretakers, and with consultants:1} {Document social determinants of health affecting pt's care:1} {Document your decision making why or why not admission, treatments were needed:1} Final Clinical Impression(s) / ED Diagnoses Final diagnoses:  Breakthrough seizure (HCC)  Closed fracture of proximal end of left humerus with delayed healing, unspecified fracture morphology, subsequent encounter    Rx / DC Orders ED Discharge Orders          Ordered    Ambulatory referral to Neurology       Comments: An appointment is requested in approximately: 2 weeks   05/14/24 2041

## 2024-05-14 NOTE — Discharge Instructions (Addendum)
 The workup in the emergency room is overall reassuring.  It appears that the seizure are typical, but it is unclear why you are having seizures.  We would like you to see a neurologist to see if we can reduce the episodes of seizures.  Expect a call from neurology service within 72 hours, if they do not call you, then consider calling the number provided to set up an appointment.

## 2024-05-14 NOTE — Progress Notes (Signed)
 Orthopedic Tech Progress Note Patient Details:  Kirsten Mcmahon [birth year] [id number]  Ortho Devices Type of Ortho Device: Sling immobilizer Ortho Device/Splint Location: LUE Ortho Device/Splint Interventions: Ordered, Application   Post Interventions Patient Tolerated: Fair Instructions Provided: Care of device   [name] L Dandrea Medders 05/14/2024, 9:03 PM

## 2024-05-18 ENCOUNTER — Emergency Department (HOSPITAL_COMMUNITY)

## 2024-05-18 ENCOUNTER — Emergency Department (HOSPITAL_COMMUNITY)
Admission: EM | Admit: 2024-05-18 | Discharge: 2024-05-18 | Disposition: A | Discharge status: Home / Self Care | Attending: Emergency Medicine | Admitting: Emergency Medicine

## 2024-05-18 ENCOUNTER — Other Ambulatory Visit: Payer: Self-pay

## 2024-05-18 ENCOUNTER — Encounter (HOSPITAL_COMMUNITY): Payer: Self-pay

## 2024-05-18 DIAGNOSIS — Z91040 Latex allergy status: Secondary | ICD-10-CM | POA: Diagnosis not present

## 2024-05-18 DIAGNOSIS — Z0000 Encounter for general adult medical examination without abnormal findings: Secondary | ICD-10-CM | POA: Diagnosis present

## 2024-05-18 DIAGNOSIS — F0390 Unspecified dementia without behavioral disturbance: Secondary | ICD-10-CM | POA: Diagnosis not present

## 2024-05-18 DIAGNOSIS — Z7982 Long term (current) use of aspirin: Secondary | ICD-10-CM | POA: Insufficient documentation

## 2024-05-18 LAB — CBC WITH DIFFERENTIAL/PLATELET
Abs Immature Granulocytes: 0.15 10*3/uL — ABNORMAL HIGH (ref 0.00–0.07)
Basophils Absolute: 0.1 10*3/uL (ref 0.0–0.1)
Basophils Relative: 0 %
Eosinophils Absolute: 0.5 10*3/uL (ref 0.0–0.5)
Eosinophils Relative: 3 %
HCT: 25.9 % — ABNORMAL LOW (ref 36.0–46.0)
Hemoglobin: 8.2 g/dL — ABNORMAL LOW (ref 12.0–15.0)
Immature Granulocytes: 1 %
Lymphocytes Relative: 18 %
Lymphs Abs: 2.8 10*3/uL (ref 0.7–4.0)
MCH: 34.2 pg — ABNORMAL HIGH (ref 26.0–34.0)
MCHC: 31.7 g/dL (ref 30.0–36.0)
MCV: 107.9 fL — ABNORMAL HIGH (ref 80.0–100.0)
Monocytes Absolute: 1 10*3/uL (ref 0.1–1.0)
Monocytes Relative: 6 %
Neutro Abs: 11.3 10*3/uL — ABNORMAL HIGH (ref 1.7–7.7)
Neutrophils Relative %: 72 %
Platelets: 299 10*3/uL (ref 150–400)
RBC: 2.4 MIL/uL — ABNORMAL LOW (ref 3.87–5.11)
RDW: 12.7 % (ref 11.5–15.5)
WBC: 15.8 10*3/uL — ABNORMAL HIGH (ref 4.0–10.5)
nRBC: 0 % (ref 0.0–0.2)

## 2024-05-18 LAB — MAGNESIUM: Magnesium: 2.2 mg/dL (ref 1.7–2.4)

## 2024-05-18 LAB — BASIC METABOLIC PANEL WITH GFR
Anion gap: 17 — ABNORMAL HIGH (ref 5–15)
BUN: 48 mg/dL — ABNORMAL HIGH (ref 8–23)
CO2: 20 mmol/L — ABNORMAL LOW (ref 22–32)
Calcium: 8.9 mg/dL (ref 8.9–10.3)
Chloride: 102 mmol/L (ref 98–111)
Creatinine, Ser: 11.97 mg/dL — ABNORMAL HIGH (ref 0.44–1.00)
GFR, Estimated: 3 mL/min — ABNORMAL LOW (ref >60)
Glucose, Bld: 93 mg/dL (ref 70–99)
Potassium: 4.7 mmol/L (ref 3.5–5.1)
Sodium: 139 mmol/L (ref 135–145)

## 2024-05-18 LAB — PHOSPHORUS: Phosphorus: 5.2 mg/dL — ABNORMAL HIGH (ref 2.5–4.6)

## 2024-05-18 NOTE — ED Notes (Signed)
 PT is still refusing blood work, PT fighting Rns and Lab to obtain blood. PT states she wont allow us . Provider made aware and talking to PT at this time and family at bedside.

## 2024-05-18 NOTE — ED Notes (Signed)
 Per EDP, it's ok to draw labs from right arm (distal from graft)...KM

## 2024-05-18 NOTE — ED Notes (Signed)
 Pt not cooperation and will not allow me to stick her. Nurse will try to stick pt.   KM

## 2024-05-18 NOTE — ED Notes (Signed)
 I spoke with pts son on the phone and he stated pt was up here for routine HD. Pt currently not in a HD center.

## 2024-05-18 NOTE — ED Provider Notes (Signed)
 [place] EMERGENCY DEPARTMENT AT [facility] Provider Note   CSN: [id number] Arrival date & time: 05/18/24  1352     Patient presents with: Needs Dialysis   KATHELYN GOMBOS is a 78 y.o. female presenting to the ED with family concern for missed dialysis several times this week.  The patient has dementia.  Her grandson is present at the bedside.  The patient reports he makes urine occasionally but not on a regular basis.   HPI     Prior to Admission medications   Medication Sig Start Date End Date Taking? Authorizing Provider  acetaminophen  (TYLENOL ) 500 MG tablet Take 500-1,000 mg by mouth daily as needed for moderate pain (pain score 4-6), fever or headache.    [provider]  anastrozole  (ARIMIDEX ) 1 MG tablet Take 1 tablet (1 mg total) by mouth daily. 01/05/24   Cameron Cea, MD  aspirin  EC 81 MG tablet Take 81 mg by mouth daily.    [provider]  B Complex-C-Zn-Folic Acid  (DIALYVITE 800-ZINC 15) 0.8 MG TABS Take 1 tablet by mouth daily.    [provider]  bisacodyl  (DULCOLAX) 10 MG suppository Place 1 suppository (10 mg total) rectally as needed for moderate constipation. 02/14/24   Oral Billings, MD  calcitRIOL  (ROCALTROL ) 0.5 MCG capsule Take 1 capsule (0.5 mcg total) by mouth every Monday, Wednesday, and Friday with hemodialysis. 07/31/21   Kraig Peru, MD  docusate sodium  (COLACE) 100 MG capsule Take 1 capsule (100 mg total) by mouth 2 (two) times daily. Patient taking differently: Take 100 mg by mouth every other day. 06/06/23   Kraig Peru, MD  donepezil  (ARICEPT ) 10 MG tablet Take 10 mg by mouth at bedtime. 06/28/22   [provider]  HYDROmorphone  (DILAUDID ) 2 MG tablet Take 0.5 tablets (1 mg total) by mouth every 6 (six) hours as needed for severe pain (pain score 7-10). 02/13/24   Oral Billings, MD  lacosamide  150 MG TABS Take 1 tablet (150 mg total) by mouth 2 (two) times daily. Patient not taking: Reported on  02/07/2024 06/04/23   Kraig Peru, MD  latanoprost  (XALATAN ) 0.005 % ophthalmic solution Place 1 drop into both eyes at bedtime. 09/07/18   Cook, Jayce G, DO  levothyroxine  (SYNTHROID , LEVOTHROID) 112 MCG tablet Take 1 tablet (112 mcg total) by mouth daily before breakfast. 09/07/18   Cook, Jayce G, DO  Methoxy PEG-Epoetin  Beta (MIRCERA IJ) See admin instructions. 2 times a month at dialysis 08/10/23 08/08/24  [provider]  polyethylene glycol powder (GLYCOLAX /MIRALAX ) 17 GM/SCOOP powder Take 17 g by mouth daily. 02/14/24   Oral Billings, MD  senna (SENOKOT) 8.6 MG TABS tablet Take 1 tablet (8.6 mg total) by mouth daily. 02/14/24   Oral Billings, MD  sucroferric oxyhydroxide (VELPHORO ) 500 MG chewable tablet Chew 500 mg by mouth 3 (three) times daily with meals. 10/13/21   [provider]    Allergies: Aricept  [donepezil ], Contrast media [iodinated contrast media], Latex, Shellfish allergy, and Levemir [insulin  detemir]    Review of Systems  Updated Vital Signs BP 131/74   Pulse 85   Temp 98 F (36.7 C)   Resp (!) 22   SpO2 100%   Physical Exam Constitutional:      General: She is not in acute distress. HENT:     Head: Normocephalic and atraumatic.   Eyes:     Conjunctiva/sclera: Conjunctivae normal.     Pupils: Pupils are equal, round, and reactive  to light.    Cardiovascular:     Rate and Rhythm: Normal rate and regular rhythm.  Pulmonary:     Effort: Pulmonary effort is normal. No respiratory distress.  Abdominal:     General: There is no distension.     Tenderness: There is no abdominal tenderness.   Skin:    General: Skin is warm and dry.   Neurological:     General: No focal deficit present.     Mental Status: She is alert. Mental status is at baseline.   Psychiatric:        Mood and Affect: Mood normal.     (all labs ordered are listed, but only abnormal results are displayed) Labs Reviewed  CBC WITH DIFFERENTIAL/PLATELET - Abnormal;  Notable for the following components:      Result Value   WBC 15.8 (*)    RBC 2.40 (*)    Hemoglobin 8.2 (*)    HCT 25.9 (*)    MCV 107.9 (*)    MCH 34.2 (*)    Neutro Abs 11.3 (*)    Abs Immature Granulocytes 0.15 (*)    All other components within normal limits  BASIC METABOLIC PANEL WITH GFR - Abnormal; Notable for the following components:   CO2 20 (*)    BUN 48 (*)    Creatinine, Ser 11.97 (*)    GFR, Estimated 3 (*)    Anion gap 17 (*)    All other components within normal limits  PHOSPHORUS - Abnormal; Notable for the following components:   Phosphorus 5.2 (*)    All other components within normal limits  MAGNESIUM     EKG: None  Radiology: DG Chest 1 View Result Date: 05/18/2024 CLINICAL DATA:  dialysis EXAM: CHEST  1 VIEW COMPARISON:  May 14, 2024, February 17, 2014 FINDINGS: The right lung apex is partially obscured by the patient's chin. No focal airspace consolidation, pleural effusion, or pneumothorax. Unchanged cardiomegaly. Tortuosity/ectasia of the aorta, unchanged, with aortic atherosclerosis. No acute, displaced rib fracture or destructive lesions. Multilevel thoracic osteophytosis. Surgical clips in the right axilla and lower neck/upper chest. IMPRESSION: Unchanged cardiomegaly. Otherwise, no acute cardiopulmonary abnormality. Electronically Signed   By: Rance Burrows M.D.   On: 05/18/2024 15:36     Procedures   Medications Ordered in the ED - No data to display  Clinical Course as of 05/18/24 2221  Fri May 18, 2024  1953 Challenging IV access with patient refusing multiple times; challenging with dementia, she seems agreeable to let staff try again [MT]    Clinical Course User Index [MT] Letty Salvi, Janalyn Me, MD                                 Medical Decision Making Amount and/or Complexity of Data Reviewed Labs: ordered.   Patient is here having missed dialysis this week.  She is stable on exam.  No hypoxia or labored breathing.  I reviewed her labs  with no emergent findings.  There is no indication for emergency dialysis at this time and she is at her baseline and stable for discharge home.  Her family will take her home and she can follow-up with dialysis next week.     Final diagnoses:  Adult wellness visit    ED Discharge Orders     None          Arvilla Birmingham, MD 05/18/24 2221

## 2024-05-18 NOTE — ED Notes (Signed)
 Patient refused bloodwork

## 2024-05-18 NOTE — Discharge Instructions (Addendum)
 Kirsten Mcmahon can go to her regularly scheduled dialysis next week.  Her blood tests were reassuring and she did not require emergency dialysis today or in the hospital over the weekend.

## 2024-05-18 NOTE — ED Triage Notes (Addendum)
 Patient in triage, patient does not know why she is at the hospital, caregiver dropped patient off and is not at bedside at this time.  *Per son on phone, patient no longer has dialysis center and needs routine dialysis.

## 2024-05-22 ENCOUNTER — Encounter: Payer: Self-pay | Admitting: Neurology

## 2024-05-25 ENCOUNTER — Encounter: Payer: Self-pay | Admitting: Neurology

## 2024-06-22 ENCOUNTER — Ambulatory Visit: Payer: Medicare (Managed Care) | Admitting: Neurology

## 2024-09-21 ENCOUNTER — Other Ambulatory Visit: Payer: Self-pay

## 2024-09-21 ENCOUNTER — Ambulatory Visit (HOSPITAL_COMMUNITY)
Admission: RE | Admit: 2024-09-21 | Discharge: 2024-09-21 | Disposition: A | Discharge status: Home / Self Care | Attending: Vascular Surgery | Admitting: Vascular Surgery

## 2024-09-21 ENCOUNTER — Encounter (HOSPITAL_COMMUNITY)
Admission: RE | Disposition: A | Discharge status: Home / Self Care | Payer: Self-pay | Source: Home / Self Care | Attending: Vascular Surgery

## 2024-09-21 DIAGNOSIS — Z992 Dependence on renal dialysis: Secondary | ICD-10-CM | POA: Diagnosis not present

## 2024-09-21 DIAGNOSIS — E1122 Type 2 diabetes mellitus with diabetic chronic kidney disease: Secondary | ICD-10-CM | POA: Diagnosis not present

## 2024-09-21 DIAGNOSIS — N186 End stage renal disease: Secondary | ICD-10-CM | POA: Diagnosis not present

## 2024-09-21 DIAGNOSIS — I120 Hypertensive chronic kidney disease with stage 5 chronic kidney disease or end stage renal disease: Secondary | ICD-10-CM | POA: Insufficient documentation

## 2024-09-21 DIAGNOSIS — I69331 Monoplegia of upper limb following cerebral infarction affecting right dominant side: Secondary | ICD-10-CM | POA: Insufficient documentation

## 2024-09-21 DIAGNOSIS — Y832 Surgical operation with anastomosis, bypass or graft as the cause of abnormal reaction of the patient, or of later complication, without mention of misadventure at the time of the procedure: Secondary | ICD-10-CM | POA: Diagnosis not present

## 2024-09-21 DIAGNOSIS — T82858A Stenosis of vascular prosthetic devices, implants and grafts, initial encounter: Secondary | ICD-10-CM | POA: Diagnosis present

## 2024-09-21 DIAGNOSIS — Z79899 Other long term (current) drug therapy: Secondary | ICD-10-CM | POA: Diagnosis not present

## 2024-09-21 HISTORY — PX: A/V FISTULAGRAM: CATH118298

## 2024-09-21 HISTORY — PX: VENOUS ANGIOPLASTY: CATH118376

## 2024-09-21 SURGERY — A/V FISTULAGRAM
Anesthesia: LOCAL | Site: Arm Upper | Laterality: Right

## 2024-09-21 MED ORDER — METHYLPREDNISOLONE SODIUM SUCC 125 MG IJ SOLR
INTRAMUSCULAR | Status: AC
  Filled 2024-09-21: qty 2

## 2024-09-21 MED ORDER — DIPHENHYDRAMINE HCL 50 MG/ML IJ SOLN
INTRAMUSCULAR | Status: DC | PRN
  Administered 2024-09-21: 50 mg via INTRAVENOUS

## 2024-09-21 MED ORDER — ACETAMINOPHEN 325 MG PO TABS: 650.0000 mg | ORAL_TABLET | Freq: Four times a day (QID) | ORAL | Status: DC | PRN

## 2024-09-21 MED ORDER — LIDOCAINE HCL (PF) 1 % IJ SOLN
INTRAMUSCULAR | Status: DC | PRN
  Administered 2024-09-21: 5 mL

## 2024-09-21 MED ORDER — HEPARIN (PORCINE) IN NACL 1000-0.9 UT/500ML-% IV SOLN
INTRAVENOUS | Status: DC | PRN
  Administered 2024-09-21: 500 mL

## 2024-09-21 MED ORDER — IODIXANOL 320 MG/ML IV SOLN
INTRAVENOUS | Status: DC | PRN
  Administered 2024-09-21: 30 mL via INTRAVENOUS

## 2024-09-21 MED ORDER — LIDOCAINE HCL (PF) 1 % IJ SOLN
INTRAMUSCULAR | Status: AC
  Filled 2024-09-21: qty 30

## 2024-09-21 MED ORDER — DIPHENHYDRAMINE HCL 50 MG/ML IJ SOLN
INTRAMUSCULAR | Status: AC
  Filled 2024-09-21: qty 1

## 2024-09-21 MED ADMIN — Methylprednisolone Sod Succ For Inj 125 MG (Base Equiv): 125 mg | INTRAVENOUS | NDC 00009004722

## 2024-09-21 SURGICAL SUPPLY — 12 items
BALLOON MUSTANG 10.0X40 75 (BALLOONS) IMPLANT
BALLOON MUSTANG 8.0X40 75 (BALLOONS) IMPLANT
CATH ANGIO 5F BER2 65CM (CATHETERS) IMPLANT
DEVICE INFLATION ENCORE 26 (MISCELLANEOUS) IMPLANT
KIT MICROPUNCTURE NIT STIFF (SHEATH) IMPLANT
KIT PV (KITS) ×2 IMPLANT
MAT PREVALON FULL STRYKER (MISCELLANEOUS) IMPLANT
SHEATH PINNACLE R/O II 6F 4CM (SHEATH) IMPLANT
SHEATH PROBE COVER 6X72 (BAG) IMPLANT
TRAY PV CATH (CUSTOM PROCEDURE TRAY) ×2 IMPLANT
TUBING CIL FLEX 10 FLL-RA (TUBING) IMPLANT
WIRE BENTSON .035X145CM (WIRE) IMPLANT

## 2024-09-21 NOTE — H&P (Signed)
 H+P  History of Present Illness: This is a 78 y.o. female on dialysis via right upper arm AV graft placed by Dr. Eliza in 2022.  She has had difficulty with cannulation on dialysis and pulling clots.  She is here to discuss options moving forward with possible fistulogram.  She does have a history of a previous stroke with right upper extremity weakness.  Past Medical History:  Diagnosis Date   Anemia    Cancer (HCC) 2017   Right breast   ESRD on hemodialysis (HCC)    GERD (gastroesophageal reflux disease)    Glaucoma    Hemiparesis (HCC)    left side   High cholesterol    History of seizure    after a spider bite; 07/15/21   History of stroke with residual deficit    left-side weakness   Hypertension    states BP under control with meds., has been on med. x 2 yr.   Hypothyroidism    Non-insulin  dependent type 2 diabetes mellitus (HCC)    Overactive bladder    PFO (patent foramen ovale) 05/15/2021   Stroke (HCC)    1998 weakness on left side; 05/12/21    Past Surgical History:  Procedure Laterality Date   ABDOMINAL HYSTERECTOMY     complete   AV FISTULA PLACEMENT Right 09/29/2021   Procedure: INSERTION OF RIGHT ARM ARTERIOVENOUS (AV) GORE-TEX GRAFT;  Surgeon: Eliza Lonni RAMAN, MD;  Location: [facility] OR;  Service: Vascular;  Laterality: Right;   BREAST BIOPSY Left 08/03/2018   Benign adipose tissue   BREAST BIOPSY Right 02/15/2023   US  RT BREAST BX W LOC DEV 1ST LESION IMG BX SPEC US  GUIDE 02/15/2023 [facility]   BREAST BIOPSY Left 02/15/2023   US  LT BREAST BX W LOC DEV 1ST LESION IMG BX SPEC US  GUIDE 02/15/2023 [facility]   BREAST EXCISIONAL BIOPSY Right 2014   Positive   BREAST LUMPECTOMY Right    BUBBLE STUDY  05/15/2021   Procedure: BUBBLE STUDY;  Surgeon: Jeffrie Oneil BROCKS, MD;  Location: [facility] ENDOSCOPY;  Service: Cardiovascular;;   CATARACT EXTRACTION W/ INTRAOCULAR LENS IMPLANT Left    CEREBRAL ANEURYSM REPAIR  1998   DIALYSIS/PERMA CATHETER INSERTION N/A  04/10/2021   Procedure: DIALYSIS/PERMA CATHETER INSERTION;  Surgeon: Marea Selinda RAMAN, MD;  Location: [facility] INVASIVE CV LAB;  Service: Cardiovascular;  Laterality: N/A;   IR FLUORO GUIDE CV LINE RIGHT  04/30/2021   MASTECTOMY MODIFIED RADICAL Right 12/08/2023   Procedure: RIGHT MODIFIED RADICAL MASTECTOMY;  Surgeon: Curvin Deward MOULD, MD;  Location: [facility] ORS;  Service: General;  Laterality: Right;   PICC LINE INSERTION     TEE WITHOUT CARDIOVERSION N/A 05/15/2021   Procedure: TRANSESOPHAGEAL ECHOCARDIOGRAM (TEE);  Surgeon: Jeffrie Oneil BROCKS, MD;  Location: [facility] ENDOSCOPY;  Service: Cardiovascular;  Laterality: N/A;   THYROID  LOBECTOMY Right 07/15/2017   Procedure: RIGHT THYROID  LOBECTOMY;  Surgeon: Eletha Boas, MD;  Location: [facility] OR;  Service: General;  Laterality: Right;    Allergies  Allergen Reactions   Aricept  [Donepezil ] Nausea And Vomiting   Contrast Media [Iodinated Contrast Media] Swelling   Latex Itching   Shellfish Allergy Other (See Comments)    Unknown reaction   Levemir [Insulin  Detemir] Itching    Prior to Admission medications   Medication Sig Start Date End Date Taking? Authorizing Provider  acetaminophen  (TYLENOL ) 500 MG tablet Take 500-1,000 mg by mouth daily as needed for moderate pain (pain score 4-6), fever or headache.    [provider]  anastrozole  (  ARIMIDEX ) 1 MG tablet Take 1 tablet (1 mg total) by mouth daily. 01/05/24   Odean Potts, MD  aspirin  EC 81 MG tablet Take 81 mg by mouth daily.    [provider]  B Complex-C-Zn-Folic Acid  (DIALYVITE 800-ZINC 15) 0.8 MG TABS Take 1 tablet by mouth daily.    [provider]  bisacodyl  (DULCOLAX) 10 MG suppository Place 1 suppository (10 mg total) rectally as needed for moderate constipation. 02/14/24   Cindy Garnette POUR, MD  calcitRIOL  (ROCALTROL ) 0.5 MCG capsule Take 1 capsule (0.5 mcg total) by mouth every Monday, Wednesday, and Friday with hemodialysis. 07/31/21   Tobie Yetta HERO, MD  docusate sodium  (COLACE) 100  MG capsule Take 1 capsule (100 mg total) by mouth 2 (two) times daily. Patient taking differently: Take 100 mg by mouth every other day. 06/06/23   Tobie Yetta HERO, MD  donepezil  (ARICEPT ) 10 MG tablet Take 10 mg by mouth at bedtime. 06/28/22   [provider]  HYDROmorphone  (DILAUDID ) 2 MG tablet Take 0.5 tablets (1 mg total) by mouth every 6 (six) hours as needed for severe pain (pain score 7-10). 02/13/24   Cindy Garnette POUR, MD  lacosamide  150 MG TABS Take 1 tablet (150 mg total) by mouth 2 (two) times daily. Patient not taking: Reported on 02/07/2024 06/04/23   Tobie Yetta HERO, MD  latanoprost  (XALATAN ) 0.005 % ophthalmic solution Place 1 drop into both eyes at bedtime. 09/07/18   Cook, Jayce G, DO  levothyroxine  (SYNTHROID , LEVOTHROID) 112 MCG tablet Take 1 tablet (112 mcg total) by mouth daily before breakfast. 09/07/18   Cook, Jayce G, DO  polyethylene glycol powder (GLYCOLAX /MIRALAX ) 17 GM/SCOOP powder Take 17 g by mouth daily. 02/14/24   Cindy Garnette POUR, MD  senna (SENOKOT) 8.6 MG TABS tablet Take 1 tablet (8.6 mg total) by mouth daily. 02/14/24   Cindy Garnette POUR, MD  sucroferric oxyhydroxide (VELPHORO ) 500 MG chewable tablet Chew 500 mg by mouth 3 (three) times daily with meals. 10/13/21   [provider]    Social History   Socioeconomic History   Marital status: Single    Spouse name: Not on file   Number of children: Not on file   Years of education: Not on file   Highest education level: Not on file  Occupational History   Not on file  Tobacco Use   Smoking status: Never   Smokeless tobacco: Never  Vaping Use   Vaping status: Never Used  Substance and Sexual Activity   Alcohol  use: No   Drug use: No   Sexual activity: Never    Birth control/protection: Post-menopausal  Other Topics Concern   Not on file  Social History Narrative   Lives with son and wife   Right handed    Can not use right arm for BP    Social Drivers of Corporate investment banker Strain:  Low Risk  (04/19/2024)   Received from [facility]   Overall Financial Resource Strain (CARDIA)    Difficulty of Paying Living Expenses: Not hard at all  Food Insecurity: No Food Insecurity (04/19/2024)   Received from [facility]   Hunger Vital Sign    Within the past 12 months, you worried that your food would run out before you got the money to buy more.: Never true    Within the past 12 months, the food you bought just didn't last and you didn't have money to get more.: Never true  Transportation Needs: No Transportation  Needs (04/19/2024)   Received from [facility]   PRAPARE - Transportation    Lack of Transportation (Medical): No    Lack of Transportation (Non-Medical): No  Physical Activity: Unknown (04/19/2024)   Received from [facility]   Exercise Vital Sign    On average, how many days per week do you engage in moderate to strenuous exercise (like a brisk walk)?: 0 days    Minutes of Exercise per Session: Not on file  Stress: No Stress Concern Present (04/19/2024)   Received from [facility] - Occupational Stress Questionnaire    Feeling of Stress : Not at all  Social Connections: Socially Integrated (04/19/2024)   Received from [facility]   Social Network    How would you rate your social network (family, work, friends)?: Good participation with social networks  Intimate Partner Violence: Not At Risk (04/19/2024)   Received from [facility]   HITS    Over the last 12 months how often did your partner physically hurt you?: Never    Over the last 12 months how often did your partner insult you or talk down to you?: Never    Over the last 12 months how often did your partner threaten you with physical harm?: Never    Over the last 12 months how often did your partner scream or curse at you?: Never     Family History  Problem Relation Age of Onset   Cancer Brother        possible prostate cancer per her daughter    Breast cancer Neg Hx     ROS: as above  Physical Examination  Vitals:   09/21/24 0931  BP: (!) 155/80  Pulse: 75  Resp: 14  SpO2: 98%    Awake alert and oriented Nonlabored aspirations Right upper arm AV graft with pulsatility  CBC    Component Value Date/Time   WBC 15.8 (H) 05/18/2024 2018   RBC 2.40 (L) 05/18/2024 2018   HGB 8.2 (L) 05/18/2024 2018   HCT 25.9 (L) 05/18/2024 2018   PLT 299 05/18/2024 2018   MCV 107.9 (H) 05/18/2024 2018   MCH 34.2 (H) 05/18/2024 2018   MCHC 31.7 05/18/2024 2018   RDW 12.7 05/18/2024 2018   LYMPHSABS 2.8 05/18/2024 2018   MONOABS 1.0 05/18/2024 2018   EOSABS 0.5 05/18/2024 2018   BASOSABS 0.1 05/18/2024 2018    BMET    Component Value Date/Time   NA 139 05/18/2024 2018   K 4.7 05/18/2024 2018   CL 102 05/18/2024 2018   CO2 20 (L) 05/18/2024 2018   GLUCOSE 93 05/18/2024 2018   BUN 48 (H) 05/18/2024 2018   CREATININE 11.97 (H) 05/18/2024 2018   CALCIUM  8.9 05/18/2024 2018   GFRNONAA 3 (L) 05/18/2024 2018   GFRAA 34 (L) 08/01/2018 1056    COAGS: Lab Results  Component Value Date   INR 0.9 05/25/2023   INR 1.0 01/11/2022   INR 1.0 04/30/2021     ASSESSMENT/PLAN: This is a 78 y.o. female on dialysis via right arm AV graft.  She has had malfunction of the right arm AV graft with difficulty cannulation and pulling clots.  We have discussed her options including fistulogram she has elected to proceed.  We discussed the risk benefits alternatives and consent was signed.  Elvin Banker C. Sheree, MD [facility] Office: [phone] Pager: [phone]

## 2024-09-21 NOTE — Op Note (Signed)
    Patient name: Kirsten Mcmahon MRN: [id number] DOB: 1946-03-05 Sex: female  09/21/2024 Pre-operative Diagnosis: End-stage renal disease, malfunction right arm AV graft with difficult cannulation and pulling clots on dialysis. Post-operative diagnosis:  Same Surgeon:  Penne BROCKS. Sheree, MD Procedure Performed: 1.  Percutaneous ultrasound-guided cannulation right upper extremity AV graft 2.  Right upper extremity and central shuntogram 3.  Plain balloon angioplasty right innominate vein stenosis with 10 mm balloon, balloon angioplasty of intragraft and outflow graft stenosis with 8 mm balloon  Indications: 78 year old female on dialysis via right upper arm AV graft.  She has had difficulty with dialysis with cannulation and pulling clots.  We have discussed her options and she is elected for fistulogram with possible invention.  We discussed the risk benefits and alternatives and she demonstrates good understanding and agrees to proceed.  Findings: On ultrasound evaluation of the graft there appeared to be disease in the midsegment.  The graft was cannulated near the arterial graft anastomosis.  Right upper extremity shuntogram demonstrated intragraft stenoses measuring 70% as well as graft outflow stenoses measuring 70% and 90% stenosis at the innominate subclavian junction.  We attempted balloon angioplasty of the innominate subclavian junction but secondary to the thoracic outlet this lesion was not amenable to intervention and is not amenable to stenting.  The outflow stenosis and the intragraft stenosis were ballooned to 0% residual stenosis and there was a very strong thrill at completion.  The graft remains amenable to future percutaneous access and intervention.   Procedure:  The patient was identified in the holding area and taken to the heart and vascular procedure room.  The patient was then placed supine on the table and prepped and draped in the usual sterile fashion.  A time out was  called.  Ultrasound was used to evaluate the right arm AV graft.  In the midsegment it appeared to be diseased we then anesthetized near the artery to graft anastomosis and cannulated with direct ultrasound visualization and ultrasound images saved to the permanent record.  We administered Benadryl  and Versed .  We then performed right upper extremity fistulogram.  With the above findings we elected for intervention.  Bentson wire was placed followed by a 6 [place] sheath.  No wire was easily crossed the graft and the graft to vein anastomosis with a KMP catheter was necessary to cross the innominate vein stenosis.  We then placed the wire into the IVC.  We began with balloon angioplasty of the innominate vein with 10 mm balloon both at nominal and then high-pressure but unfortunately this was a stenosis involved with the thoracic outlet and it was not amenable to balloon angioplasty.  We turned our attention to the graft outflow and the intragraft stenoses and these were ballooned with 8 mm balloons both at nominal pressure.  When the outflow balloon was inflated we also perform retrograde imaging and demonstrated very strong inflow from the graft arterial anastomosis.  At completion there was brisk flow through both the intragraft and outflow stenoses which were previously 70% were reduced to 0%.  There was a much improved thrill in the graft.  Satisfied with this we removed the wire and sheath and suture-ligated the cannulation site.  She tolerated the procedure well or may complication.  Contrast: 30 cc  Lucky Trotta C. Sheree, MD [facility] [place] Office: [phone] Pager: [phone]

## 2024-09-24 ENCOUNTER — Encounter (HOSPITAL_COMMUNITY): Payer: Self-pay | Admitting: Vascular Surgery

## 2024-10-08 ENCOUNTER — Encounter: Payer: Self-pay | Admitting: Radiology

## 2024-11-05 ENCOUNTER — Other Ambulatory Visit: Payer: Self-pay

## 2024-11-05 ENCOUNTER — Encounter (HOSPITAL_COMMUNITY): Payer: Self-pay

## 2024-11-05 ENCOUNTER — Emergency Department (HOSPITAL_COMMUNITY)
Admission: EM | Admit: 2024-11-05 | Discharge: 2024-11-05 | Disposition: A | Discharge status: Home / Self Care | Attending: Emergency Medicine | Admitting: Emergency Medicine

## 2024-11-05 ENCOUNTER — Emergency Department (HOSPITAL_COMMUNITY)

## 2024-11-05 DIAGNOSIS — Z79899 Other long term (current) drug therapy: Secondary | ICD-10-CM | POA: Diagnosis not present

## 2024-11-05 DIAGNOSIS — N186 End stage renal disease: Secondary | ICD-10-CM | POA: Insufficient documentation

## 2024-11-05 DIAGNOSIS — Z853 Personal history of malignant neoplasm of breast: Secondary | ICD-10-CM | POA: Diagnosis not present

## 2024-11-05 DIAGNOSIS — Z91040 Latex allergy status: Secondary | ICD-10-CM | POA: Diagnosis not present

## 2024-11-05 DIAGNOSIS — K5732 Diverticulitis of large intestine without perforation or abscess without bleeding: Secondary | ICD-10-CM | POA: Diagnosis not present

## 2024-11-05 DIAGNOSIS — K5792 Diverticulitis of intestine, part unspecified, without perforation or abscess without bleeding: Secondary | ICD-10-CM

## 2024-11-05 DIAGNOSIS — Z992 Dependence on renal dialysis: Secondary | ICD-10-CM | POA: Diagnosis not present

## 2024-11-05 DIAGNOSIS — F0280 Dementia in other diseases classified elsewhere without behavioral disturbance: Secondary | ICD-10-CM | POA: Diagnosis not present

## 2024-11-05 DIAGNOSIS — Z8673 Personal history of transient ischemic attack (TIA), and cerebral infarction without residual deficits: Secondary | ICD-10-CM | POA: Insufficient documentation

## 2024-11-05 DIAGNOSIS — E039 Hypothyroidism, unspecified: Secondary | ICD-10-CM | POA: Insufficient documentation

## 2024-11-05 DIAGNOSIS — G40919 Epilepsy, unspecified, intractable, without status epilepticus: Secondary | ICD-10-CM | POA: Diagnosis not present

## 2024-11-05 DIAGNOSIS — E1122 Type 2 diabetes mellitus with diabetic chronic kidney disease: Secondary | ICD-10-CM | POA: Diagnosis not present

## 2024-11-05 DIAGNOSIS — Z7982 Long term (current) use of aspirin: Secondary | ICD-10-CM | POA: Insufficient documentation

## 2024-11-05 DIAGNOSIS — I120 Hypertensive chronic kidney disease with stage 5 chronic kidney disease or end stage renal disease: Secondary | ICD-10-CM | POA: Diagnosis not present

## 2024-11-05 DIAGNOSIS — R569 Unspecified convulsions: Secondary | ICD-10-CM | POA: Diagnosis present

## 2024-11-05 LAB — CBC WITH DIFFERENTIAL/PLATELET
Abs Immature Granulocytes: 0.04 K/uL (ref 0.00–0.07)
Basophils Absolute: 0 K/uL (ref 0.0–0.1)
Basophils Relative: 0 %
Eosinophils Absolute: 0.1 K/uL (ref 0.0–0.5)
Eosinophils Relative: 1 %
HCT: 39.2 % (ref 36.0–46.0)
Hemoglobin: 12.2 g/dL (ref 12.0–15.0)
Immature Granulocytes: 0 %
Lymphocytes Relative: 8 %
Lymphs Abs: 0.8 K/uL (ref 0.7–4.0)
MCH: 30.5 pg (ref 26.0–34.0)
MCHC: 31.1 g/dL (ref 30.0–36.0)
MCV: 98 fL (ref 80.0–100.0)
Monocytes Absolute: 0.6 K/uL (ref 0.1–1.0)
Monocytes Relative: 6 %
Neutro Abs: 8.9 K/uL — ABNORMAL HIGH (ref 1.7–7.7)
Neutrophils Relative %: 85 %
Platelets: 209 K/uL (ref 150–400)
RBC: 4 MIL/uL (ref 3.87–5.11)
RDW: 15.9 % — ABNORMAL HIGH (ref 11.5–15.5)
WBC: 10.4 K/uL (ref 4.0–10.5)
nRBC: 0 % (ref 0.0–0.2)

## 2024-11-05 LAB — COMPREHENSIVE METABOLIC PANEL WITH GFR
ALT: 13 U/L (ref 0–44)
AST: 17 U/L (ref 15–41)
Albumin: 3.1 g/dL — ABNORMAL LOW (ref 3.5–5.0)
Alkaline Phosphatase: 80 U/L (ref 38–126)
Anion gap: 18 — ABNORMAL HIGH (ref 5–15)
BUN: 25 mg/dL — ABNORMAL HIGH (ref 8–23)
CO2: 26 mmol/L (ref 22–32)
Calcium: 7.6 mg/dL — ABNORMAL LOW (ref 8.9–10.3)
Chloride: 97 mmol/L — ABNORMAL LOW (ref 98–111)
Creatinine, Ser: 7.38 mg/dL — ABNORMAL HIGH (ref 0.44–1.00)
GFR, Estimated: 5 mL/min — ABNORMAL LOW (ref >60)
Glucose, Bld: 225 mg/dL — ABNORMAL HIGH (ref 70–99)
Potassium: 3.3 mmol/L — ABNORMAL LOW (ref 3.5–5.1)
Sodium: 141 mmol/L (ref 135–145)
Total Bilirubin: 0.6 mg/dL (ref 0.0–1.2)
Total Protein: 7.2 g/dL (ref 6.5–8.1)

## 2024-11-05 LAB — MAGNESIUM: Magnesium: 2.1 mg/dL (ref 1.7–2.4)

## 2024-11-05 MED ORDER — FIBERCON 625 MG PO TABS: 625.0000 mg | ORAL_TABLET | Freq: Every day | ORAL | 0 refills | Status: DC

## 2024-11-05 MED ORDER — AMOXICILLIN-POT CLAVULANATE 250-125 MG PO TABS
1.0000 | ORAL_TABLET | Freq: Every day | ORAL | Status: DC
  Administered 2024-11-05: 1 via ORAL
  Filled 2024-11-05: qty 1

## 2024-11-05 MED ORDER — AMOXICILLIN-POT CLAVULANATE 250-125 MG PO TABS: 1.0000 | ORAL_TABLET | Freq: Every day | ORAL | 0 refills | Status: DC

## 2024-11-05 NOTE — ED Notes (Signed)
 Son Kirsten Mcmahon [phone] would like an update asap

## 2024-11-05 NOTE — ED Notes (Signed)
 Spoke to the son and he states that this has happened numerous time where mom has a seizure during dialysis. He is concerned because of the amount of times this has happened.

## 2024-11-05 NOTE — Discharge Instructions (Addendum)
 Make sure pt is taking her seizure medication (Vimpat ).

## 2024-11-05 NOTE — ED Notes (Signed)
 Dialysis notified to come deaccess her fistula.

## 2024-11-05 NOTE — ED Provider Notes (Signed)
 [place] EMERGENCY DEPARTMENT AT [facility] Provider Note   CSN: [id number] Arrival date & time: 11/05/24  1610     Patient presents with: Seizures   Kirsten Mcmahon is a 78 y.o. female.   Pt is a 79 yo female with pmhx significant for dementia, ESRD on HD (MWF), seizures, glaucoma, hypothyroidism, CVA with left sided weakness, HLD, DM2, HTN, breast cancer, and anemia.  Pt was at dialysis today and had a 7 minute seizure.  She is on Vimpat  for her seizures.  Per son, she has breakthrough seizures sometimes at dialysis when they take too much fluid. She has not had any at home.  The last time she was here for seizures was back in March and it also occurred during dialysis.  Pt is complaining of some lower abd pain.         Prior to Admission medications   Medication Sig Start Date End Date Taking? Authorizing Provider  amoxicillin -clavulanate (AUGMENTIN ) 250-125 MG tablet Take 1 tablet by mouth daily. Take after dialysis on dialysis days. 11/05/24  Yes Dean Clarity, MD  polycarbophil (FIBERCON) 625 MG tablet Take 1 tablet (625 mg total) by mouth daily. 11/05/24  Yes Dean Clarity, MD  acetaminophen  (TYLENOL ) 500 MG tablet Take 500-1,000 mg by mouth daily as needed for moderate pain (pain score 4-6), fever or headache.    [provider]  anastrozole  (ARIMIDEX ) 1 MG tablet Take 1 tablet (1 mg total) by mouth daily. 01/05/24   Odean Potts, MD  aspirin  EC 81 MG tablet Take 81 mg by mouth daily.    [provider]  B Complex-C-Zn-Folic Acid  (DIALYVITE 800-ZINC 15) 0.8 MG TABS Take 1 tablet by mouth daily.    [provider]  bisacodyl  (DULCOLAX) 10 MG suppository Place 1 suppository (10 mg total) rectally as needed for moderate constipation. 02/14/24   Cindy Garnette POUR, MD  calcitRIOL  (ROCALTROL ) 0.5 MCG capsule Take 1 capsule (0.5 mcg total) by mouth every Monday, Wednesday, and Friday with hemodialysis. 07/31/21   Tobie Yetta HERO, MD  docusate  sodium (COLACE) 100 MG capsule Take 1 capsule (100 mg total) by mouth 2 (two) times daily. Patient taking differently: Take 100 mg by mouth every other day. 06/06/23   Tobie Yetta HERO, MD  donepezil  (ARICEPT ) 10 MG tablet Take 10 mg by mouth at bedtime. 06/28/22   [provider]  HYDROmorphone  (DILAUDID ) 2 MG tablet Take 0.5 tablets (1 mg total) by mouth every 6 (six) hours as needed for severe pain (pain score 7-10). 02/13/24   Cindy Garnette POUR, MD  lacosamide  150 MG TABS Take 1 tablet (150 mg total) by mouth 2 (two) times daily. Patient not taking: Reported on 02/07/2024 06/04/23   Tobie Yetta HERO, MD  latanoprost  (XALATAN ) 0.005 % ophthalmic solution Place 1 drop into both eyes at bedtime. 09/07/18   Cook, Jayce G, DO  levothyroxine  (SYNTHROID , LEVOTHROID) 112 MCG tablet Take 1 tablet (112 mcg total) by mouth daily before breakfast. 09/07/18   Cook, Jayce G, DO  polyethylene glycol powder (GLYCOLAX /MIRALAX ) 17 GM/SCOOP powder Take 17 g by mouth daily. 02/14/24   Cindy Garnette POUR, MD  senna (SENOKOT) 8.6 MG TABS tablet Take 1 tablet (8.6 mg total) by mouth daily. 02/14/24   Cindy Garnette POUR, MD  sucroferric oxyhydroxide (VELPHORO ) 500 MG chewable tablet Chew 500 mg by mouth 3 (three) times daily with meals. 10/13/21   [provider]    Allergies: Aricept  [donepezil ], Contrast media [iodinated contrast media],  Latex, Shellfish allergy, and Levemir [insulin  detemir]    Review of Systems  Gastrointestinal:  Positive for abdominal pain.  Neurological:  Positive for seizures.  All other systems reviewed and are negative.   Updated Vital Signs BP 139/73   Pulse 95   Temp 99.9 F (37.7 C) (Axillary)   Resp (!) 21   Ht 5' 4 (1.626 m)   Wt 60 kg   SpO2 99%   BMI 22.71 kg/m   Physical Exam Vitals and nursing note reviewed.  Constitutional:      Appearance: Normal appearance. She is obese.  HENT:     Head: Normocephalic and atraumatic.     Right Ear: External ear normal.     Left  Ear: External ear normal.     Nose: Nose normal.     Mouth/Throat:     Mouth: Mucous membranes are dry.  Eyes:     Extraocular Movements: Extraocular movements intact.     Conjunctiva/sclera: Conjunctivae normal.     Pupils: Pupils are equal, round, and reactive to light.  Cardiovascular:     Rate and Rhythm: Normal rate and regular rhythm.     Pulses: Normal pulses.     Heart sounds: Normal heart sounds.  Pulmonary:     Effort: Pulmonary effort is normal.     Breath sounds: Normal breath sounds.  Abdominal:     General: Bowel sounds are normal. There is distension.     Palpations: Abdomen is soft.     Tenderness: There is abdominal tenderness in the left lower quadrant.  Musculoskeletal:        General: No deformity.     Cervical back: Normal range of motion and neck supple.  Skin:    General: Skin is warm.     Capillary Refill: Capillary refill takes less than 2 seconds.  Neurological:     Mental Status: She is alert. Mental status is at baseline.     Comments: Left sided weakness (chronic)  Psychiatric:        Mood and Affect: Mood normal.     (all labs ordered are listed, but only abnormal results are displayed) Labs Reviewed  COMPREHENSIVE METABOLIC PANEL WITH GFR - Abnormal; Notable for the following components:      Result Value   Potassium 3.3 (*)    Chloride 97 (*)    Glucose, Bld 225 (*)    BUN 25 (*)    Creatinine, Ser 7.38 (*)    Calcium  7.6 (*)    Albumin  3.1 (*)    GFR, Estimated 5 (*)    Anion gap 18 (*)    All other components within normal limits  CBC WITH DIFFERENTIAL/PLATELET - Abnormal; Notable for the following components:   RDW 15.9 (*)    Neutro Abs 8.9 (*)    All other components within normal limits  MAGNESIUM   CBG MONITORING, ED    EKG: EKG Interpretation Date/Time:  Monday November 05 2024 16:28:19 EST Ventricular Rate:  93 PR Interval:  132 QRS Duration:  79 QT Interval:  421 QTC Calculation: 521 R Axis:   -22  Text  Interpretation: Sinus rhythm Borderline left axis deviation Probable anterior infarct, age indeterminate Prolonged QT interval No significant change since last tracing Confirmed by Dean Clarity [phone]) on 11/05/2024 6:02:29 PM  Radiology: CT ABDOMEN PELVIS WO CONTRAST Result Date: 11/05/2024 CLINICAL DATA:  Acute abdominal pain. EXAM: CT ABDOMEN AND PELVIS WITHOUT CONTRAST TECHNIQUE: Multidetector CT imaging of the abdomen and pelvis was performed  following the standard protocol without IV contrast. RADIATION DOSE REDUCTION: This exam was performed according to the departmental dose-optimization program which includes automated exposure control, adjustment of the mA and/or kV according to patient size and/or use of iterative reconstruction technique. COMPARISON:  CT 08/19/2023 FINDINGS: Lower chest: The heart is enlarged. Atelectasis in the lower lobes. No pleural effusion. Skin thickening and edema within the left breast/chest wall, also seen on prior exam. Hepatobiliary: Punctate calcified granuloma. No evidence of focal liver lesion on this unenhanced exam. Gallstones within decompressed gallbladder. No pericholecystic inflammation. No biliary dilatation. Pancreas: Low-density lesion in the pancreatic tail measures 13 mm, unchanged from prior exam. No ductal dilatation or inflammation. Spleen: Normal in size without focal abnormality. Adrenals/Urinary Tract: Again seen bilateral adrenal thickening, stable. Chronic bilateral renal parenchymal atrophy. No hydronephrosis. Punctate left renal stones, nonobstructing. Bilateral renal vascular calcifications. Decompressed urinary bladder. Stomach/Bowel: Small hiatal hernia. Gastric distension with air and ingested contents. No gastric wall thickening. No small bowel distension or obstruction. Normal appendix. Moderate volume of stool throughout the colon. Scattered colonic diverticulosis. Question early inflammation about the descending colonic diverticula,  coronal series 6, image 51. Vascular/Lymphatic: Aortic atherosclerosis. No aneurysm. No portal venous gas. No suspicious adenopathy. Reproductive: Status post hysterectomy. No adnexal masses. Other: No ascites. No free air or focal fluid collection. Mild generalized body wall edema. Musculoskeletal: The bones are subjectively under mineralized. Degenerative change throughout the spine and both hips. There are no acute or suspicious osseous abnormalities. IMPRESSION: 1. Colonic diverticulosis with question of early inflammation about the descending colonic diverticula, possible early diverticulitis. 2. Cholelithiasis without CT findings of acute cholecystitis. 3. Chronic bilateral renal parenchymal atrophy. Nonobstructing left renal stones. 4. Stable low-density lesion in the pancreatic tail measuring 13 mm. Attention at follow-up recommended. 5. Generalized body wall edema, including edema within the left breast. May be related to chronic renal disease or volume overload. Aortic Atherosclerosis (ICD10-I70.0). Electronically Signed   By: [name]   On: 11/05/2024 18:10     Procedures   Medications Ordered in the ED  amoxicillin -clavulanate (AUGMENTIN ) 250-125 MG per tablet 1 tablet (1 tablet Oral Given 11/05/24 1904)                                    Medical Decision Making Amount and/or Complexity of Data Reviewed Labs: ordered. Radiology: ordered.  Risk Prescription drug management.   This patient presents to the ED for concern of seizure, this involves an extensive number of treatment options, and is a complaint that carries with it a high risk of complications and morbidity.  The differential diagnosis includes breakthrough seizure, noncompliance, electrolyte abn   Co morbidities that complicate the patient evaluation  ementia, ESRD on HD (MWF), seizures, glaucoma, hypothyroidism, CVA with left sided weakness, HLD, DM2, HTN, breast cancer, and anemia   Additional history  obtained:  Additional history obtained from epic chart review External records from outside source obtained and reviewed including EMS report; son [place])   Lab Tests:  I Ordered, and personally interpreted labs.  The pertinent results include:  cbc nl; cmp nl other than k sl low at 3.3 (just had dialysis), glucose is elevated at 225, bun 25 and cr 2.38 (stable); mg nl   Imaging Studies ordered:  I ordered imaging studies including ct abd/pelvis  I independently visualized and interpreted imaging which showed   Colonic diverticulosis with question of early inflammation  about  the descending colonic diverticula, possible early diverticulitis.  2. Cholelithiasis without CT findings of acute cholecystitis.  3. Chronic bilateral renal parenchymal atrophy. Nonobstructing left  renal stones.  4. Stable low-density lesion in the pancreatic tail measuring 13 mm.  Attention at follow-up recommended.  5. Generalized body wall edema, including edema within the left  breast. May be related to chronic renal disease or volume overload.    Aortic Atherosclerosis (ICD10-I70.0).   I agree with the radiologist interpretation   Cardiac Monitoring:  The patient was maintained on a cardiac monitor.  I personally viewed and interpreted the cardiac monitored which showed an underlying rhythm of: nsr   Medicines ordered and prescription drug management:  I ordered medication including augmentin   for sx  Reevaluation of the patient after these medicines showed that the patient improved I have reviewed the patients home medicines and have made adjustments as needed   Test Considered:  ct  Problem List / ED Course:  Breakthrough seizure:  this is consistent with prior episodes.  Pt encouraged to take her Vimpat . Abd pain:  diverticulitis on CT.  Pt started on dialysis Augmentin  dosing.  Pt encouraged to eat a high fiber diet.   Reevaluation:  After the interventions noted above, I  reevaluated the patient and found that they have :improved   Social Determinants of Health:  Lives with son   Dispostion:  After consideration of the diagnostic results and the patients response to treatment, I feel that the patent would benefit from discharge with outpatient f/u.       Final diagnoses:  Breakthrough seizure (HCC)  Diverticulitis  ESRD on hemodialysis [facility])    ED Discharge Orders          Ordered    amoxicillin -clavulanate (AUGMENTIN ) 250-125 MG tablet  Daily        11/05/24 1853    polycarbophil (FIBERCON) 625 MG tablet  Daily        11/05/24 1936               Dean Clarity, MD 11/05/24 1945

## 2024-11-05 NOTE — ED Triage Notes (Signed)
 Patient bib [facility] from dialysis facility. 2.2 liters - put 600 ml back in. Had a seizure in chair lasted 7 minutes. HD staff didn't give any meds to break the seizure. Post-ictal on arrival for EMS. Complaints of abdominal pain and headache.  Vitals with EMS  245 CBG 100% RA 12 R  100 HR 144/86

## 2024-11-09 ENCOUNTER — Observation Stay (HOSPITAL_COMMUNITY)
Admission: EM | Admit: 2024-11-09 | Discharge: 2024-11-11 | Disposition: A | Discharge status: Home / Self Care | Attending: Internal Medicine | Admitting: Internal Medicine

## 2024-11-09 ENCOUNTER — Observation Stay (HOSPITAL_COMMUNITY)

## 2024-11-09 ENCOUNTER — Emergency Department (HOSPITAL_COMMUNITY)

## 2024-11-09 ENCOUNTER — Other Ambulatory Visit: Payer: Self-pay

## 2024-11-09 DIAGNOSIS — G40909 Epilepsy, unspecified, not intractable, without status epilepticus: Secondary | ICD-10-CM | POA: Diagnosis not present

## 2024-11-09 DIAGNOSIS — Z91040 Latex allergy status: Secondary | ICD-10-CM | POA: Diagnosis not present

## 2024-11-09 DIAGNOSIS — Z853 Personal history of malignant neoplasm of breast: Secondary | ICD-10-CM | POA: Insufficient documentation

## 2024-11-09 DIAGNOSIS — N186 End stage renal disease: Secondary | ICD-10-CM

## 2024-11-09 DIAGNOSIS — Z7982 Long term (current) use of aspirin: Secondary | ICD-10-CM | POA: Insufficient documentation

## 2024-11-09 DIAGNOSIS — F0150 Vascular dementia without behavioral disturbance: Secondary | ICD-10-CM | POA: Diagnosis present

## 2024-11-09 DIAGNOSIS — K5792 Diverticulitis of intestine, part unspecified, without perforation or abscess without bleeding: Secondary | ICD-10-CM | POA: Diagnosis present

## 2024-11-09 DIAGNOSIS — Z79899 Other long term (current) drug therapy: Secondary | ICD-10-CM | POA: Diagnosis not present

## 2024-11-09 DIAGNOSIS — E039 Hypothyroidism, unspecified: Secondary | ICD-10-CM | POA: Diagnosis present

## 2024-11-09 DIAGNOSIS — R29898 Other symptoms and signs involving the musculoskeletal system: Secondary | ICD-10-CM | POA: Diagnosis not present

## 2024-11-09 DIAGNOSIS — R410 Disorientation, unspecified: Secondary | ICD-10-CM

## 2024-11-09 DIAGNOSIS — G9340 Encephalopathy, unspecified: Secondary | ICD-10-CM | POA: Insufficient documentation

## 2024-11-09 DIAGNOSIS — R262 Difficulty in walking, not elsewhere classified: Secondary | ICD-10-CM | POA: Diagnosis not present

## 2024-11-09 DIAGNOSIS — R569 Unspecified convulsions: Principal | ICD-10-CM

## 2024-11-09 DIAGNOSIS — K5732 Diverticulitis of large intestine without perforation or abscess without bleeding: Secondary | ICD-10-CM | POA: Insufficient documentation

## 2024-11-09 DIAGNOSIS — D631 Anemia in chronic kidney disease: Secondary | ICD-10-CM | POA: Insufficient documentation

## 2024-11-09 DIAGNOSIS — I120 Hypertensive chronic kidney disease with stage 5 chronic kidney disease or end stage renal disease: Secondary | ICD-10-CM | POA: Insufficient documentation

## 2024-11-09 DIAGNOSIS — D638 Anemia in other chronic diseases classified elsewhere: Secondary | ICD-10-CM | POA: Diagnosis present

## 2024-11-09 DIAGNOSIS — Z992 Dependence on renal dialysis: Secondary | ICD-10-CM | POA: Insufficient documentation

## 2024-11-09 LAB — CBC
HCT: 38 % (ref 36.0–46.0)
Hemoglobin: 11.6 g/dL — ABNORMAL LOW (ref 12.0–15.0)
MCH: 30.3 pg (ref 26.0–34.0)
MCHC: 30.5 g/dL (ref 30.0–36.0)
MCV: 99.2 fL (ref 80.0–100.0)
Platelets: 282 K/uL (ref 150–400)
RBC: 3.83 MIL/uL — ABNORMAL LOW (ref 3.87–5.11)
RDW: 16 % — ABNORMAL HIGH (ref 11.5–15.5)
WBC: 9 K/uL (ref 4.0–10.5)
nRBC: 0 % (ref 0.0–0.2)

## 2024-11-09 LAB — I-STAT CHEM 8, ED
BUN: 61 mg/dL — ABNORMAL HIGH (ref 8–23)
Calcium, Ion: 0.88 mmol/L — CL (ref 1.15–1.40)
Chloride: 103 mmol/L (ref 98–111)
Creatinine, Ser: 15.1 mg/dL — ABNORMAL HIGH (ref 0.44–1.00)
Glucose, Bld: 132 mg/dL — ABNORMAL HIGH (ref 70–99)
HCT: 37 % (ref 36.0–46.0)
Hemoglobin: 12.6 g/dL (ref 12.0–15.0)
Potassium: 4.1 mmol/L (ref 3.5–5.1)
Sodium: 143 mmol/L (ref 135–145)
TCO2: 25 mmol/L (ref 22–32)

## 2024-11-09 LAB — DIFFERENTIAL
Abs Immature Granulocytes: 0.05 K/uL (ref 0.00–0.07)
Basophils Absolute: 0.1 K/uL (ref 0.0–0.1)
Basophils Relative: 1 %
Eosinophils Absolute: 0.1 K/uL (ref 0.0–0.5)
Eosinophils Relative: 1 %
Immature Granulocytes: 1 %
Lymphocytes Relative: 8 %
Lymphs Abs: 0.7 K/uL (ref 0.7–4.0)
Monocytes Absolute: 0.4 K/uL (ref 0.1–1.0)
Monocytes Relative: 5 %
Neutro Abs: 7.7 K/uL (ref 1.7–7.7)
Neutrophils Relative %: 84 %

## 2024-11-09 LAB — ETHANOL: Alcohol, Ethyl (B): <15 mg/dL (ref <15)

## 2024-11-09 LAB — COMPREHENSIVE METABOLIC PANEL WITH GFR
ALT: 11 U/L (ref 0–44)
AST: 18 U/L (ref 15–41)
Albumin: 3.3 g/dL — ABNORMAL LOW (ref 3.5–5.0)
Alkaline Phosphatase: 75 U/L (ref 38–126)
Anion gap: 20 — ABNORMAL HIGH (ref 5–15)
BUN: 57 mg/dL — ABNORMAL HIGH (ref 8–23)
CO2: 24 mmol/L (ref 22–32)
Calcium: 7.8 mg/dL — ABNORMAL LOW (ref 8.9–10.3)
Chloride: 101 mmol/L (ref 98–111)
Creatinine, Ser: 14.37 mg/dL — ABNORMAL HIGH (ref 0.44–1.00)
GFR, Estimated: 2 mL/min — ABNORMAL LOW (ref >60)
Glucose, Bld: 134 mg/dL — ABNORMAL HIGH (ref 70–99)
Potassium: 4.3 mmol/L (ref 3.5–5.1)
Sodium: 145 mmol/L (ref 135–145)
Total Bilirubin: 0.9 mg/dL (ref 0.0–1.2)
Total Protein: 7.3 g/dL (ref 6.5–8.1)

## 2024-11-09 LAB — HEPATITIS B SURFACE ANTIGEN: Hepatitis B Surface Ag: NONREACTIVE

## 2024-11-09 LAB — TSH: TSH: 4.248 u[IU]/mL (ref 0.350–4.500)

## 2024-11-09 LAB — PROTIME-INR
INR: 1.1 (ref 0.8–1.2)
Prothrombin Time: 14.4 s (ref 11.4–15.2)

## 2024-11-09 LAB — APTT: aPTT: 27 s (ref 24–36)

## 2024-11-09 LAB — MRSA NEXT GEN BY PCR, NASAL: MRSA by PCR Next Gen: NOT DETECTED

## 2024-11-09 LAB — CBG MONITORING, ED: Glucose-Capillary: 117 mg/dL — ABNORMAL HIGH (ref 70–99)

## 2024-11-09 MED ORDER — LORAZEPAM 2 MG/ML IJ SOLN: 1.0000 mg | INTRAMUSCULAR | Status: DC | PRN

## 2024-11-09 MED ORDER — CALCITRIOL 0.5 MCG PO CAPS: 0.5000 ug | ORAL_CAPSULE | ORAL | Status: DC

## 2024-11-09 MED ORDER — CHLORHEXIDINE GLUCONATE CLOTH 2 % EX PADS
6.0000 | MEDICATED_PAD | Freq: Every day | CUTANEOUS | Status: DC
  Administered 2024-11-10: 6 via TOPICAL

## 2024-11-09 MED ORDER — SODIUM CHLORIDE 0.9% FLUSH: 3.0000 mL | Freq: Once | INTRAVENOUS | Status: DC

## 2024-11-09 MED ORDER — HEPARIN SODIUM (PORCINE) 5000 UNIT/ML IJ SOLN
5000.0000 [IU] | Freq: Three times a day (TID) | INTRAMUSCULAR | Status: DC
  Administered 2024-11-09 – 2024-11-11 (×6): 5000 [IU] via SUBCUTANEOUS
  Filled 2024-11-09 (×6): qty 1

## 2024-11-09 MED ORDER — STROKE: EARLY STAGES OF RECOVERY BOOK
Freq: Once | Status: AC
  Filled 2024-11-09: qty 1

## 2024-11-09 MED ORDER — LACOSAMIDE 50 MG PO TABS
150.0000 mg | ORAL_TABLET | Freq: Two times a day (BID) | ORAL | Status: DC
  Administered 2024-11-09 – 2024-11-11 (×4): 150 mg via ORAL
  Filled 2024-11-09 (×4): qty 3

## 2024-11-09 MED ORDER — AMOXICILLIN-POT CLAVULANATE 250-125 MG PO TABS
1.0000 | ORAL_TABLET | Freq: Once | ORAL | Status: AC
  Administered 2024-11-10: 1 via ORAL
  Filled 2024-11-09: qty 1

## 2024-11-09 MED ORDER — ACETAMINOPHEN 325 MG PO TABS: 650.0000 mg | ORAL_TABLET | Freq: Four times a day (QID) | ORAL | Status: DC | PRN

## 2024-11-09 MED ORDER — ANASTROZOLE 1 MG PO TABS
1.0000 mg | ORAL_TABLET | Freq: Every day | ORAL | Status: DC
  Administered 2024-11-10 – 2024-11-11 (×2): 1 mg via ORAL
  Filled 2024-11-09 (×2): qty 1

## 2024-11-09 MED ORDER — ALBUTEROL SULFATE (2.5 MG/3ML) 0.083% IN NEBU: 2.5000 mg | INHALATION_SOLUTION | Freq: Four times a day (QID) | RESPIRATORY_TRACT | Status: DC | PRN

## 2024-11-09 MED ORDER — LORAZEPAM 2 MG/ML IJ SOLN
1.0000 mg | Freq: Once | INTRAMUSCULAR | Status: AC
  Administered 2024-11-09: 1 mg via INTRAVENOUS
  Filled 2024-11-09: qty 1

## 2024-11-09 MED ORDER — LEVOTHYROXINE SODIUM 112 MCG PO TABS: 112.0000 ug | ORAL_TABLET | Freq: Every day | ORAL | Status: DC

## 2024-11-09 MED ORDER — ACETAMINOPHEN 650 MG RE SUPP: 650.0000 mg | Freq: Four times a day (QID) | RECTAL | Status: DC | PRN

## 2024-11-09 MED ORDER — SODIUM CHLORIDE 0.9 % IV SOLN
200.0000 mg | Freq: Once | INTRAVENOUS | Status: AC
  Administered 2024-11-09: 200 mg via INTRAVENOUS
  Filled 2024-11-09: qty 20

## 2024-11-09 MED ORDER — SACCHAROMYCES BOULARDII 250 MG PO CAPS
250.0000 mg | ORAL_CAPSULE | Freq: Once | ORAL | Status: AC
  Administered 2024-11-10: 250 mg via ORAL
  Filled 2024-11-09: qty 1

## 2024-11-09 NOTE — ED Provider Notes (Signed)
 [facility] AT [facility] Provider Note   CSN: [id number] Arrival date & time: 11/09/24  1119  An emergency department physician performed an initial assessment on this suspected stroke patient at 1122.  Patient presents with: No chief complaint on file.   Kirsten Mcmahon is a 78 y.o. female   HPI  Patient presented with EMS as CVA alert.  Per son Darold: Last time saw normal was last night. Was about to take her to dialysis. Patient was twitching and shaking. Started foaming at mouth, full body shaking. Lasted about a minute. When he first saw her this morning she was acting abnormal. Out of her medication. Last time she took her seizure medication was last week. Is on lacosamide .   According to EMS, patient has been altered since approximate last night.  Had some shaking episodes today and possibly had a tonic-clonic seizure today with family.  Lasted about a minute    Previous medical history reviewed : Patient was last seen in the ED on November 05, 2024.  Seizure.  Sometimes she has breakthrough seizures while at dialysis.  Diverticulitis on CT.  Started patient on dialysis Augmentin  dosing.      Prior to Admission medications   Medication Sig Start Date End Date Taking? Authorizing Provider  acetaminophen  (TYLENOL ) 500 MG tablet Take 500-1,000 mg by mouth daily as needed for moderate pain (pain score 4-6), fever or headache.    [provider]  amoxicillin -clavulanate (AUGMENTIN ) 250-125 MG tablet Take 1 tablet by mouth daily. Take after dialysis on dialysis days. 11/05/24   Dean Clarity, MD  anastrozole  (ARIMIDEX ) 1 MG tablet Take 1 tablet (1 mg total) by mouth daily. 01/05/24   Odean Potts, MD  aspirin  EC 81 MG tablet Take 81 mg by mouth daily.    [provider]  B Complex-C-Zn-Folic Acid  (DIALYVITE 800-ZINC 15) 0.8 MG TABS Take 1 tablet by mouth daily.    [provider]  bisacodyl  (DULCOLAX) 10 MG  suppository Place 1 suppository (10 mg total) rectally as needed for moderate constipation. 02/14/24   Cindy Garnette POUR, MD  calcitRIOL  (ROCALTROL ) 0.5 MCG capsule Take 1 capsule (0.5 mcg total) by mouth every Monday, Wednesday, and Friday with hemodialysis. 07/31/21   Tobie Yetta HERO, MD  docusate sodium  (COLACE) 100 MG capsule Take 1 capsule (100 mg total) by mouth 2 (two) times daily. Patient taking differently: Take 100 mg by mouth every other day. 06/06/23   Tobie Yetta HERO, MD  donepezil  (ARICEPT ) 10 MG tablet Take 10 mg by mouth at bedtime. 06/28/22   [provider]  HYDROmorphone  (DILAUDID ) 2 MG tablet Take 0.5 tablets (1 mg total) by mouth every 6 (six) hours as needed for severe pain (pain score 7-10). 02/13/24   Cindy Garnette POUR, MD  lacosamide  150 MG TABS Take 1 tablet (150 mg total) by mouth 2 (two) times daily. Patient not taking: Reported on 02/07/2024 06/04/23   Tobie Yetta HERO, MD  latanoprost  (XALATAN ) 0.005 % ophthalmic solution Place 1 drop into both eyes at bedtime. 09/07/18   Cook, Jayce G, DO  levothyroxine  (SYNTHROID , LEVOTHROID) 112 MCG tablet Take 1 tablet (112 mcg total) by mouth daily before breakfast. 09/07/18   Cook, Jayce G, DO  polycarbophil (FIBERCON) 625 MG tablet Take 1 tablet (625 mg total) by mouth daily. 11/05/24   Haviland, Julie, MD  polyethylene glycol powder (GLYCOLAX /MIRALAX ) 17 GM/SCOOP powder Take 17 g by mouth daily. 02/14/24   Cindy Garnette POUR, MD  senna (SENOKOT) 8.6 MG TABS tablet Take 1 tablet (8.6 mg total) by mouth daily. 02/14/24   Cindy Garnette POUR, MD  sucroferric oxyhydroxide (VELPHORO ) 500 MG chewable tablet Chew 500 mg by mouth 3 (three) times daily with meals. 10/13/21   [provider]    Allergies: Aricept  [donepezil ], Contrast Kirsten [iodinated contrast Kirsten], Latex, Shellfish allergy, and Levemir [insulin  detemir]    Review of Systems  Constitutional:  Negative for chills and fever.  HENT:  Negative for ear pain and sore throat.    Eyes:  Negative for pain and visual disturbance.  Respiratory:  Negative for cough and shortness of breath.   Cardiovascular:  Negative for chest pain and palpitations.  Gastrointestinal:  Negative for abdominal pain and vomiting.  Genitourinary:  Negative for dysuria and hematuria.  Musculoskeletal:  Negative for arthralgias and back pain.  Skin:  Negative for color change and rash.  Neurological:  Negative for seizures and syncope.  All other systems reviewed and are negative.   Updated Vital Signs BP (!) 153/96   Pulse 88   Temp 98.6 F (37 C) (Tympanic)   Resp (!) 22   Wt 65.4 kg   SpO2 99%   BMI 24.75 kg/m   Physical Exam Vitals and nursing note reviewed.  Constitutional:      General: She is not in acute distress.    Appearance: She is well-developed.  HENT:     Head: Normocephalic and atraumatic.  Eyes:     Conjunctiva/sclera: Conjunctivae normal.  Cardiovascular:     Rate and Rhythm: Normal rate and regular rhythm.     Heart sounds: No murmur heard. Pulmonary:     Effort: Pulmonary effort is normal. No respiratory distress.     Breath sounds: Normal breath sounds.  Abdominal:     Palpations: Abdomen is soft.     Tenderness: There is no abdominal tenderness.  Musculoskeletal:        General: No swelling.     Cervical back: Neck supple.  Skin:    General: Skin is warm and dry.     Capillary Refill: Capillary refill takes less than 2 seconds.  Neurological:     Mental Status: She is alert.  Psychiatric:        Mood and Affect: Mood normal.    1a Level of Conscious 0[x]  1[]  2[]  3[]         1b LOC Questions 0[]  1[]  2[x]           1c LOC Commands 0[]  1[]  2[x]           2 Best Gaze 0[]  1[x]  2[]           3 Visual 0[]  1[x]  2[]  3[]         4 Facial Palsy 0[x]  1[]  2[]  3[]         5a Motor Arm - left 0[]  1[]  2[]  3[x]  4[]  UN[]     5b Motor Arm - Right 0[]  1[]  2[x]  3[]  4[]  UN[]     6a Motor Leg - Left 0[]  1[]  2[]  3[x]  4[]  UN[]     6b Motor Leg - Right 0[]  1[]  2[x]  3[]   4[]  UN[]     7 Limb Ataxia 0[x]  1[]  2[]  UN[]         8 Sensory 0[]  1[x]  2[]  UN[]         9 Best Language 0[]  1[]  2[x]  3[]         10 Dysarthria 0[]  1[x]  2[]  UN[]         11 Extinct. and Inattention 0[]  1[x]   2[]           TOTAL:21             (all labs ordered are listed, but only abnormal results are displayed) Labs Reviewed  CBC - Abnormal; Notable for the following components:      Result Value   RBC 3.83 (*)    Hemoglobin 11.6 (*)    RDW 16.0 (*)    All other components within normal limits  COMPREHENSIVE METABOLIC PANEL WITH GFR - Abnormal; Notable for the following components:   Glucose, Bld 134 (*)    BUN 57 (*)    Creatinine, Ser 14.37 (*)    Calcium  7.8 (*)    Albumin  3.3 (*)    GFR, Estimated 2 (*)    Anion gap 20 (*)    All other components within normal limits  CBG MONITORING, ED - Abnormal; Notable for the following components:   Glucose-Capillary 117 (*)    All other components within normal limits  I-STAT CHEM 8, ED - Abnormal; Notable for the following components:   BUN 61 (*)    Creatinine, Ser 15.10 (*)    Glucose, Bld 132 (*)    Calcium , Ion 0.88 (*)    All other components within normal limits  PROTIME-INR  APTT  DIFFERENTIAL  ETHANOL  URINALYSIS, ROUTINE W REFLEX MICROSCOPIC  TSH  HEPATITIS B SURFACE ANTIGEN  HEPATITIS B SURFACE ANTIBODY, QUANTITATIVE  CBG MONITORING, ED    EKG: None  Radiology: CT HEAD CODE STROKE WO CONTRAST Result Date: 11/09/2024 EXAM: CT HEAD WITHOUT 11/09/2024 11:34:32 AM TECHNIQUE: CT of the head was performed without the administration of intravenous contrast. Automated exposure control, iterative reconstruction, and/or weight based adjustment of the mA/kV was utilized to reduce the radiation dose to as low as reasonably achievable. COMPARISON: Brain MRI 02/18/2024. Head CT 02/06/2024. CLINICAL HISTORY: 78 year old female  Neuro deficit, acute, stroke suspected. FINDINGS: BRAIN AND VENTRICLES: Advanced calcified  atherosclerosis at the skull base. No acute intracranial hemorrhage. No mass effect or midline shift. No extra-axial fluid collection. No hydrocephalus. Advanced chronic encephalomalacia in the right hemisphere, confluent in the right MCA middle and anterior divisions, right PCA territory. Superimposed moderate chronic bilateral cerebellar infarcts with encephalomalacia. Superimposed advanced chronic bilateral cerebral white matter hypodensity. Superimposed Wallerian degeneration in the deep brain nuclei and the brainstem. No suspicious intracranial vascular hyperdensity. No evidence of acute infarct. ORBITS: Rightward gaze. Chronic postoperative changes to the left globe. SINUSES AND MASTOIDS: Paranasal sinuses, tympanic cavities and mastoids are well aerated. SOFT TISSUES AND SKULL: Chronic right side craniotomy. No acute skull fracture. No acute soft tissue abnormality. [place] Stroke Program Early CT Score (ASPECTS) Ganglionic (caudate, IC, lentiform nucleus, insula, M1-M3): 7 Supraganglionic (M4-M6): 3 Total: 10 IMPRESSION: 1. Stable advanced chronic ischemic disease and encephalomalacia.  ASPECTS 10. 2. These results were communicated to Dr Vanessa at 11:41 hours on 11/09/2024 by text page via the [facility] messaging system. Electronically signed by: Helayne Hurst MD 11/09/2024 11:42 AM EST RP Workstation: [id number]     Procedures   Medications Ordered in the ED  sodium chloride  flush (NS) 0.9 % injection 3 mL (3 mLs Intravenous Not Given 11/09/24 1244)  lacosamide  (VIMPAT ) tablet 150 mg (has no administration in time range)  LORazepam  (ATIVAN ) injection 1 mg (has no administration in time range)  heparin  injection 5,000 Units (has no administration in time range)  acetaminophen  (TYLENOL ) tablet 650 mg (has no administration in time range)    Or  acetaminophen  (TYLENOL )  suppository 650 mg (has no administration in time range)  albuterol  (PROVENTIL ) (2.5 MG/3ML) 0.083% nebulizer solution 2.5 mg (has  no administration in time range)   stroke: early stages of recovery book (has no administration in time range)  amoxicillin -clavulanate (AUGMENTIN ) 250-125 MG per tablet 1 tablet (has no administration in time range)  saccharomyces boulardii (FLORASTOR) capsule 250 mg (has no administration in time range)  anastrozole  (ARIMIDEX ) tablet 1 mg (has no administration in time range)  calcitRIOL  (ROCALTROL ) capsule 0.5 mcg (has no administration in time range)  levothyroxine  (SYNTHROID ) tablet 112 mcg (has no administration in time range)  Chlorhexidine  Gluconate Cloth 2 % PADS 6 each (has no administration in time range)  LORazepam  (ATIVAN ) injection 1-2 mg (has no administration in time range)  lacosamide  (VIMPAT ) 200 mg in sodium chloride  0.9 % 25 mL IVPB (0 mg Intravenous Stopped 11/09/24 1539)                                    Medical Decision Making Amount and/or Complexity of Data Reviewed Labs: ordered.  Risk Prescription drug management. Decision regarding hospitalization.     HPI:   Patient presented with EMS as CVA alert.  Per son Darold: Last time saw normal was last night. Was about to take her to dialysis. Patient was twitching and shaking. Started foaming at mouth, full body shaking. Lasted about a minute. When he first saw her this morning she was acting abnormal. Out of her medication. Last time she took her seizure medication was last week. Is on lacosamide .   According to EMS, patient has been altered since approximate last night.  Had some shaking episodes today and possibly had a tonic-clonic seizure today with family.  Lasted about a minute    Previous medical history reviewed : Patient was last seen in the ED on November 05, 2024.  Seizure.  Sometimes she has breakthrough seizures while at dialysis.  Diverticulitis on CT.  Started patient on dialysis Augmentin  dosing.   MDM:   Upon exam, patient case deviation to the right.  Deficits on the left but this  is to be more chronic in nature.  Emergent CT head was unremarkable.  No obvious subdural epidural.  Neurology recommends MRI brain.  Patient mental status as well as neurologic exam improved while in the ED.  I think  gaze deviation and altered mental status is likely because of postictal state.  Will proceed with MRI brain.  Patient has not been compliant with her seizure medication.  Patient was loaded with Vimpat .  No further seizure episodes while here in the ED.  Patient has not had dialysis in some time.  No large electrolyte derangements but creatinine is elevated with BUN elevation.  No hyperkalemia.  No significant acidosis.  No indication for emergent dialysis.  Patient admitted for observation given recurrent seizures because of medication noncompliance.  Interventions vimpat  iv load   EKG Interpreted by Me: sinus    Cardiac Tele Interpreted by Me: sinus    I have independently interpreted the  CT  images and agree with the radiologist finding   Social Determinant of Health: medication noncompliance    Disposition and Follow Up: admit       Final diagnoses:  Seizure [facility])  Disorientation    ED Discharge Orders     None          Simon Lavonia SAILOR, MD 11/09/24 1651

## 2024-11-09 NOTE — Consult Note (Signed)
 NEUROLOGY CONSULT NOTE   Date of service: November 09, 2024 Patient Name: Kirsten Mcmahon MRN:  [id number] DOB:  Feb 25, 1946 Chief Complaint: Aphasia and right gaze deviation Requesting Provider: Simon Lavonia SAILOR, MD  History of Present Illness  Kirsten Mcmahon is a 78 y.o. female with hx of dementia, end-stage renal disease on dialysis, seizures, glaucoma, hypothyroidism, stroke with residual left-sided weakness, hypertension, hyperlipidemia, diabetes, and breast cancer status post right mastectomy presents with twitching which happened at home followed by a seizure.  After the seizure resolved, family noted aphasia and right gaze deviation and called EMS.  Patient was nonverbal and unable to follow commands for EMS, but would vocalize.  She has multiple prior presentations for breakthrough seizures which usually occur when patient is in dialysis.  At baseline, she is alert and oriented x 1 only.  LKW: 12/4 2100 Modified rankin score: 4-Needs assistance to walk and tend to bodily needs IV Thrombolysis: No, outside of window EVT: No, poor baseline mRS  NIHSS components Score: Comment  1a Level of Conscious 0[x]  1[]  2[]  3[]      1b LOC Questions 0[]  1[]  2[x]       1c LOC Commands 0[]  1[]  2[x]       2 Best Gaze 0[]  1[x]  2[]       3 Visual 0[]  1[x]  2[]  3[]      4 Facial Palsy 0[x]  1[]  2[]  3[]      5a Motor Arm - left 0[]  1[]  2[]  3[x]  4[]  UN[]    5b Motor Arm - Right 0[]  1[]  2[x]  3[]  4[]  UN[]    6a Motor Leg - Left 0[]  1[]  2[]  3[x]  4[]  UN[]    6b Motor Leg - Right 0[]  1[]  2[x]  3[]  4[]  UN[]    7 Limb Ataxia 0[x]  1[]  2[]  UN[]      8 Sensory 0[]  1[x]  2[]  UN[]      9 Best Language 0[]  1[]  2[x]  3[]      10 Dysarthria 0[]  1[x]  2[]  UN[]      11 Extinct. and Inattention 0[]  1[x]  2[]       TOTAL:21       ROS   Unable to ascertain due to aphasia and disorientation  Past History   Past Medical History:  Diagnosis Date   Anemia    Cancer (HCC) 2017   Right breast   ESRD on hemodialysis (HCC)    GERD  (gastroesophageal reflux disease)    Glaucoma    Hemiparesis (HCC)    left side   High cholesterol    History of seizure    after a spider bite; 07/15/21   History of stroke with residual deficit    left-side weakness   Hypertension    states BP under control with meds., has been on med. x 2 yr.   Hypothyroidism    Non-insulin  dependent type 2 diabetes mellitus (HCC)    Overactive bladder    PFO (patent foramen ovale) 05/15/2021   Stroke (HCC)    1998 weakness on left side; 05/12/21    Past Surgical History:  Procedure Laterality Date   A/V FISTULAGRAM Right 09/21/2024   Procedure: A/V Fistulagram;  Surgeon: Sheree Penne Bruckner, MD;  Location: [facility];  Service: Cardiovascular;  Laterality: Right;   ABDOMINAL HYSTERECTOMY     complete   AV FISTULA PLACEMENT Right 09/29/2021   Procedure: INSERTION OF RIGHT ARM ARTERIOVENOUS (AV) GORE-TEX GRAFT;  Surgeon: Eliza Bruckner RAMAN, MD;  Location: [facility] OR;  Service: Vascular;  Laterality: Right;   BREAST BIOPSY Left 08/03/2018   Benign adipose tissue  BREAST BIOPSY Right 02/15/2023   US  RT BREAST BX W LOC DEV 1ST LESION IMG BX SPEC US  GUIDE 02/15/2023 [facility]   BREAST BIOPSY Left 02/15/2023   US  LT BREAST BX W LOC DEV 1ST LESION IMG BX SPEC US  GUIDE 02/15/2023 [facility]   BREAST EXCISIONAL BIOPSY Right 2014   Positive   BREAST LUMPECTOMY Right    BUBBLE STUDY  05/15/2021   Procedure: BUBBLE STUDY;  Surgeon: Jeffrie Oneil BROCKS, MD;  Location: [facility];  Service: Cardiovascular;;   CATARACT EXTRACTION W/ INTRAOCULAR LENS IMPLANT Left    CEREBRAL ANEURYSM REPAIR  1998   DIALYSIS/PERMA CATHETER INSERTION N/A 04/10/2021   Procedure: DIALYSIS/PERMA CATHETER INSERTION;  Surgeon: Marea Selinda RAMAN, MD;  Location: [facility];  Service: Cardiovascular;  Laterality: N/A;   IR FLUORO GUIDE CV LINE RIGHT  04/30/2021   MASTECTOMY MODIFIED RADICAL Right 12/08/2023   Procedure: RIGHT MODIFIED RADICAL MASTECTOMY;  Surgeon:  Curvin Deward MOULD, MD;  Location: [facility];  Service: General;  Laterality: Right;   PICC LINE INSERTION     TEE WITHOUT CARDIOVERSION N/A 05/15/2021   Procedure: TRANSESOPHAGEAL ECHOCARDIOGRAM (TEE);  Surgeon: Jeffrie Oneil BROCKS, MD;  Location: [facility] ENDOSCOPY;  Service: Cardiovascular;  Laterality: N/A;   THYROID  LOBECTOMY Right 07/15/2017   Procedure: RIGHT THYROID  LOBECTOMY;  Surgeon: Eletha Boas, MD;  Location: [facility] OR;  Service: General;  Laterality: Right;   VENOUS ANGIOPLASTY  09/21/2024   Procedure: VENOUS ANGIOPLASTY;  Surgeon: Sheree Penne Bruckner, MD;  Location: [facility];  Service: Cardiovascular;;  90% innominate/ 70% AVG    Family History: Family History  Problem Relation Age of Onset   Cancer Brother        possible prostate cancer per her daughter   Breast cancer Neg Hx     Social History  reports that she has never smoked. She has never used smokeless tobacco. She reports that she does not drink alcohol  and does not use drugs.  Allergies  Allergen Reactions   Aricept  [Donepezil ] Nausea And Vomiting   Contrast Media [Iodinated Contrast Media] Swelling   Latex Itching   Shellfish Allergy Other (See Comments)    Unknown reaction   Levemir [Insulin  Detemir] Itching    Medications   Current Facility-Administered Medications:    lacosamide  (VIMPAT ) 200 mg in sodium chloride  0.9 % 25 mL IVPB, 200 mg, Intravenous, Once, Lemly, Lavonia SAILOR, MD   Lacosamide  TABS 150 mg, 150 mg, Oral, BID, de Clint Kill, Cortney E, NP   sodium chloride  flush (NS) 0.9 % injection 3 mL, 3 mL, Intravenous, Once, Simon Lavonia SAILOR, MD  Current Outpatient Medications:    acetaminophen  (TYLENOL ) 500 MG tablet, Take 500-1,000 mg by mouth daily as needed for moderate pain (pain score 4-6), fever or headache., Disp: , Rfl:    amoxicillin -clavulanate (AUGMENTIN ) 250-125 MG tablet, Take 1 tablet by mouth daily. Take after dialysis on dialysis days., Disp: 5 tablet, Rfl: 0   anastrozole  (ARIMIDEX ) 1 MG tablet, Take  1 tablet (1 mg total) by mouth daily., Disp: 90 tablet, Rfl: 3   aspirin  EC 81 MG tablet, Take 81 mg by mouth daily., Disp: , Rfl:    B Complex-C-Zn-Folic Acid  (DIALYVITE 800-ZINC 15) 0.8 MG TABS, Take 1 tablet by mouth daily., Disp: , Rfl:    bisacodyl  (DULCOLAX) 10 MG suppository, Place 1 suppository (10 mg total) rectally as needed for moderate constipation., Disp: 12 suppository, Rfl: 0   calcitRIOL  (ROCALTROL ) 0.5 MCG capsule, Take 1 capsule (0.5 mcg  total) by mouth every Monday, Wednesday, and Friday with hemodialysis., Disp: 30 capsule, Rfl: 0   docusate sodium  (COLACE) 100 MG capsule, Take 1 capsule (100 mg total) by mouth 2 (two) times daily. (Patient taking differently: Take 100 mg by mouth every other day.), Disp: 20 capsule, Rfl: 0   donepezil  (ARICEPT ) 10 MG tablet, Take 10 mg by mouth at bedtime., Disp: , Rfl:    HYDROmorphone  (DILAUDID ) 2 MG tablet, Take 0.5 tablets (1 mg total) by mouth every 6 (six) hours as needed for severe pain (pain score 7-10)., Disp: 15 tablet, Rfl: 0   lacosamide  150 MG TABS, Take 1 tablet (150 mg total) by mouth 2 (two) times daily. (Patient not taking: Reported on 02/07/2024), Disp: 60 tablet, Rfl: 0   latanoprost  (XALATAN ) 0.005 % ophthalmic solution, Place 1 drop into both eyes at bedtime., Disp: 2.5 mL, Rfl: 0   levothyroxine  (SYNTHROID , LEVOTHROID) 112 MCG tablet, Take 1 tablet (112 mcg total) by mouth daily before breakfast., Disp: 90 tablet, Rfl: 0   polycarbophil (FIBERCON) 625 MG tablet, Take 1 tablet (625 mg total) by mouth daily., Disp: 30 tablet, Rfl: 0   polyethylene glycol powder (GLYCOLAX /MIRALAX ) 17 GM/SCOOP powder, Take 17 g by mouth daily., Disp: 238 g, Rfl: 0   senna (SENOKOT) 8.6 MG TABS tablet, Take 1 tablet (8.6 mg total) by mouth daily., Disp: 120 tablet, Rfl: 0   sucroferric oxyhydroxide (VELPHORO ) 500 MG chewable tablet, Chew 500 mg by mouth 3 (three) times daily with meals., Disp: , Rfl:   Vitals   Vitals:   11/09/24 1100 11/09/24  1126 11/09/24 1158  BP:  (!) 162/102 (!) 153/96  Pulse:   88  Resp:   (!) 22  Temp:   98.9 F (37.2 C)  SpO2:   99%  Weight: 65.4 kg      Body mass index is 24.75 kg/m.   Physical Exam   Constitutional: Chronically ill-appearing elderly patient in no acute distress Psych: Affect labile Eyes: No scleral injection.  HENT: No OP obstruction.  Head: Normocephalic.  Respiratory: Effort normal, non-labored breathing.  Skin: WDI.   Neurologic Examination    NEURO:  Mental Status: Patient is alert and able to state her name.  She is unable to answer other questions and only intermittently follows simple commands Speech/Language: speech is with mild dysarthria.  Patient is able to state her name and that she does not know where she is and cannot say yes or no but will not answer other questions.  She will cry out frequently, difficult to determine if speech deficits are due to dementia or aphasia  Cranial Nerves:  II: PERRL.  Blinks to threat on the right but not on the left, possible left-sided visual neglect III, IV, VI: Right gaze deviation, able to go to midline VII: Smile is symmetrical.  VIII: hearing intact to voice. IX, X: Voice is mildly dysarthric XII: tongue is midline without fasciculations. Motor: Moves right upper and lower extremities with antigravity strength, moves left upper and lower extremity slightly but does not lift them off the bed Tone: is normal and bulk is normal Sensation- Intact to light touch bilaterally.  Coordination: Unable to perform Gait- deferred    Labs/Imaging/Neurodiagnostic studies   CBC:  Recent Labs  Lab 02-Dec-2024 1655  WBC 10.4  NEUTROABS 8.9*  HGB 12.2  HCT 39.2  MCV 98.0  PLT 209   Basic Metabolic Panel:  Lab Results  Component Value Date   NA 141 December 02, 2024  K 3.3 (L) 11/05/2024   CO2 26 11/05/2024   GLUCOSE 225 (H) 11/05/2024   BUN 25 (H) 11/05/2024   CREATININE 7.38 (H) 11/05/2024   CALCIUM  7.6 (L) 11/05/2024    GFRNONAA 5 (L) 11/05/2024   GFRAA 34 (L) 08/01/2018   Lipid Panel: No results found for: LDLCALC HgbA1c:  Lab Results  Component Value Date   HGBA1C 6.2 (H) 12/06/2023   Urine Drug Screen:     Component Value Date/Time   LABOPIA NONE DETECTED 02/07/2024 0231   COCAINSCRNUR NONE DETECTED 02/07/2024 0231   LABBENZ NONE DETECTED 02/07/2024 0231   AMPHETMU NONE DETECTED 02/07/2024 0231   THCU NONE DETECTED 02/07/2024 0231   LABBARB NONE DETECTED 02/07/2024 0231    Alcohol  Level     Component Value Date/Time   ETH <10 02/07/2024 0041   INR  Lab Results  Component Value Date   INR 0.9 05/25/2023   APTT  Lab Results  Component Value Date   APTT 25 05/25/2023    CT Head without contrast(Personally reviewed): No acute abnormality, stable chronic ischemic disease and encephalomalacia  MRI Brain(Personally reviewed): Pending  Neurodiagnostics Continuous EEG:  Pending  ASSESSMENT   Kirsten Mcmahon is a 78 y.o. female with hx of dementia, end-stage renal disease on dialysis, seizures, glaucoma, hypothyroidism, stroke with residual left-sided weakness, hypertension, hyperlipidemia, diabetes, and breast cancer status post right mastectomy presents with breakthrough seizure at home followed by aphasia, right gaze deviation and left hemiparesis.  Suspect that deficits are postictal, and initial head CT was negative for acute abnormality.  In any case, patient is not a candidate for TNK due to being outside the window and is not a candidate for mechanical thrombectomy due to poor baseline mRS.  Upon ED provider discussion with patient's son, she has been out of her lacosamide  for a week.  Will load with lacosamide  and resume home dose.  Will also place patient on LTM EEG.  Will obtain brain MRI as well and commence stroke workup if positive.  RECOMMENDATIONS  -LTM EEG - Load with Vimpat  200 mg and resume 150 mg Vimpat  twice daily - Seizure precautions - MRI  brain ______________________________________________________________________  Patient seen by NP with MD, MD to edit note as needed.  Signed, Cortney E Everitt Clint Kill, NP [facility]   NEUROHOSPITALIST ADDENDUM Performed a face to face diagnostic evaluation.   I have reviewed the contents of history and physical exam as documented by PA/ARNP/Resident and agree with above documentation.  I have discussed and formulated the above plan as documented. Edits to the note have been made as needed.  Impression/Key exam findings/Plan: she was brought in as a code stroke for L sied weakness and R gaze. Reported hx of twitching on the left, followed by seizure. Upon further discussion with son, she ran out of Vimpat  about a week ago. She had partial dialysis on Monday, skipped hd on wednesday as she was not feeling better. Had what appears to be a focal seizure with secondary GTC seizure.  She is not a candidate for tnakse as she is outside window. She is not a candidate for thrombectomy due to poor baseline mRS.  Wil get MRI Brain and LTM EEG. Load with Vimpat  200 and resume home Vimpat .  Lihanna Biever, MD [facility] [phone]   If 7pm to 7am, please call on call as listed on AMION.

## 2024-11-09 NOTE — Code Documentation (Signed)
 Stroke Response Nurse Documentation Code Documentation  Kirsten Mcmahon is a 78 y.o. female arriving to [facility]  via [facility] on 11/09/2024 with past medical hx of HTN, Seizures, stroke with deficits, ESRD on HD. On aspirin  325 mg daily. Code stroke was activated by EMS.   Patient from Dialysis where she was LKW at 2100 11/08/2024 and now complaining of aphasia and R gaze. Per EMS, patient had a seizure at home after which she started twitching. After the seizure resolved, family noted aphasia and right gaze deviation. On EMS arrival, patient was nonverbal and unable to follow commands but would vocalize.   Stroke team at the bedside on patient arrival. Labs drawn and patient cleared for CT by Dr. Jackquline. Patient to CT with team. NIHSS 21, see documentation for details and code stroke times. Patient with disoriented, right gaze preference , left hemianopia, bilateral arm weakness, bilateral leg weakness, left decreased sensation, Expressive aphasia , dysarthria , and Visual  neglect on exam. The following imaging was completed:  CT Head. Patient is not a candidate for IV Thrombolytic due to being outside the window. Patient is not a candidate for IR due to poor baseline mRS per MD.   Care Plan: VS/NIHSS q2hr x12hr, then q4hr; BP Goal <220/120.   Bedside handoff with ED RN Lorane.    Kirsten Mcmahon  Stroke Response RN

## 2024-11-09 NOTE — ED Triage Notes (Signed)
 [place] coming in with field stroke alert from home. 2100 12/4 LSN. Family noticed continued switching this AM and she had a tonic clonic seizure as well. The patient has hx of previous stroke and seizures. The patient presents with a new R sided gaze and aphasia, AMS. Hx dialysis, missed Wednesday. Calling out for generalized pain. Left arm very edematous.   1119 To Bridge 1123 To scanner 1140 to Room 10  First BP 162/102  EMS Vitals: 164/80 BP 93 HR 98 HR 98 O2 on RA CBG 155 RR 34

## 2024-11-09 NOTE — H&P (Signed)
 History and Physical    Patient: Kirsten Mcmahon FMW:[id number] DOB: [birth year] DOA: 11/09/2024 DOS: the patient was seen and examined on 11/09/2024 PCP: [facility]  Patient coming from: Home via EMS  Chief Complaint: Seizure  HPI: Kirsten Mcmahon is a 78 y.o. female with medical history significant of hypertension, hyperlipidemia, CVA with residual left-sided weakness, vascular dementia, ESRD on HD, seizure disorder, hypothyroidism, breast cancer s/p right mastectomy presents with seizure activity and missed dialysis sessions.  Her son reports that she had seizure which occurred at home this morning, marking the first time he has ever witnessed such an event. She has been without her seizure medication for about a week due to a delay in prescription refills from her neurologist.  He reports witnessing her twitching and following seizure was unable to speak and had a rightward gaze preference.  Son notes that she had last had a seizure at dialysis on Monday and he attributes that to when they take off too much fluid.  She was seen at the hospital and workup revealed concern for early diverticulitis for which patient was started on Augmentin .  She is on a Monday, Wednesday, Friday dialysis schedule but missed her last 2 sessions. She typically dialyzes through a fistula in her right arm, with her last session being on Monday.   She lives with her son and reports no current pain.  In the emergency department patient was noted to be afebrile with blood pressures elevated 162/102, and all other vital signs maintained.  Initial CT scan of the head noted stable advanced chronic ischemic disease and encephalomalacia.  Labs significant for hemoglobin 11.6, potassium 4.3, CO2 24, BUN 57, creatinine 14.37, anion gap 20, and INR 1.1.  Patient was loaded with 200 mg of Vimpat  IV and given Ativan  1 mg IV x 1 dose.  Review of Systems: Unable to review all systems due due to her current  state Past Medical History:  Diagnosis Date   Anemia    Cancer (HCC) 2017   Right breast   ESRD on hemodialysis (HCC)    GERD (gastroesophageal reflux disease)    Glaucoma    Hemiparesis (HCC)    left side   High cholesterol    History of seizure    after a spider bite; 07/15/21   History of stroke with residual deficit    left-side weakness   Hypertension    states BP under control with meds., has been on med. x 2 yr.   Hypothyroidism    Non-insulin  dependent type 2 diabetes mellitus (HCC)    Overactive bladder    PFO (patent foramen ovale) 05/15/2021   Stroke (HCC)    1998 weakness on left side; 05/12/21   Past Surgical History:  Procedure Laterality Date   A/V FISTULAGRAM Right 09/21/2024   Procedure: A/V Fistulagram;  Surgeon: Sheree Penne Bruckner, MD;  Location: [facility];  Service: Cardiovascular;  Laterality: Right;   ABDOMINAL HYSTERECTOMY     complete   AV FISTULA PLACEMENT Right 09/29/2021   Procedure: INSERTION OF RIGHT ARM ARTERIOVENOUS (AV) GORE-TEX GRAFT;  Surgeon: Eliza Bruckner RAMAN, MD;  Location: [facility] OR;  Service: Vascular;  Laterality: Right;   BREAST BIOPSY Left 08/03/2018   Benign adipose tissue   BREAST BIOPSY Right 02/15/2023   US  RT BREAST BX W LOC DEV 1ST LESION IMG BX SPEC US  GUIDE 02/15/2023 [facility]   BREAST BIOPSY Left 02/15/2023   US  LT BREAST BX W LOC DEV 1ST LESION  IMG BX SPEC US  GUIDE 02/15/2023 [facility]   BREAST EXCISIONAL BIOPSY Right 2014   Positive   BREAST LUMPECTOMY Right    BUBBLE STUDY  05/15/2021   Procedure: BUBBLE STUDY;  Surgeon: Jeffrie Oneil BROCKS, MD;  Location: [facility];  Service: Cardiovascular;;   CATARACT EXTRACTION W/ INTRAOCULAR LENS IMPLANT Left    CEREBRAL ANEURYSM REPAIR  1998   DIALYSIS/PERMA CATHETER INSERTION N/A 04/10/2021   Procedure: DIALYSIS/PERMA CATHETER INSERTION;  Surgeon: Marea Selinda RAMAN, MD;  Location: [facility];  Service: Cardiovascular;  Laterality: N/A;   IR FLUORO GUIDE CV  LINE RIGHT  04/30/2021   MASTECTOMY MODIFIED RADICAL Right 12/08/2023   Procedure: RIGHT MODIFIED RADICAL MASTECTOMY;  Surgeon: Curvin Deward MOULD, MD;  Location: [facility];  Service: General;  Laterality: Right;   PICC LINE INSERTION     TEE WITHOUT CARDIOVERSION N/A 05/15/2021   Procedure: TRANSESOPHAGEAL ECHOCARDIOGRAM (TEE);  Surgeon: Jeffrie Oneil BROCKS, MD;  Location: [facility];  Service: Cardiovascular;  Laterality: N/A;   THYROID  LOBECTOMY Right 07/15/2017   Procedure: RIGHT THYROID  LOBECTOMY;  Surgeon: Eletha Boas, MD;  [facility];  Service: General;  Laterality: Right;   VENOUS ANGIOPLASTY  09/21/2024   Procedure: VENOUS ANGIOPLASTY;  Surgeon: Sheree Penne Bruckner, MD;  Location: [facility];  Service: Cardiovascular;;  90% innominate/ 70% AVG   Social History:  reports that she has never smoked. She has never used smokeless tobacco. She reports that she does not drink alcohol  and does not use drugs.  Allergies  Allergen Reactions   Aricept  [Donepezil ] Nausea And Vomiting   Contrast Media [Iodinated Contrast Media] Swelling   Latex Itching   Shellfish Allergy Other (See Comments)    Unknown reaction   Levemir [Insulin  Detemir] Itching    Family History  Problem Relation Age of Onset   Cancer Brother        possible prostate cancer per her daughter   Breast cancer Neg Hx     Prior to Admission medications   Medication Sig Start Date End Date Taking? Authorizing Provider  acetaminophen  (TYLENOL ) 500 MG tablet Take 500-1,000 mg by mouth daily as needed for moderate pain (pain score 4-6), fever or headache.    [provider]  amoxicillin -clavulanate (AUGMENTIN ) 250-125 MG tablet Take 1 tablet by mouth daily. Take after dialysis on dialysis days. 11/05/24   Dean Clarity, MD  anastrozole  (ARIMIDEX ) 1 MG tablet Take 1 tablet (1 mg total) by mouth daily. 01/05/24   Gudena, Vinay, MD  aspirin  EC 81 MG tablet Take 81 mg by mouth daily.    [provider]  B  Complex-C-Zn-Folic Acid  (DIALYVITE 800-ZINC 15) 0.8 MG TABS Take 1 tablet by mouth daily.    [provider]  bisacodyl  (DULCOLAX) 10 MG suppository Place 1 suppository (10 mg total) rectally as needed for moderate constipation. 02/14/24   Cindy Garnette POUR, MD  calcitRIOL  (ROCALTROL ) 0.5 MCG capsule Take 1 capsule (0.5 mcg total) by mouth every Monday, Wednesday, and Friday with hemodialysis. 07/31/21   Tobie Yetta HERO, MD  docusate sodium  (COLACE) 100 MG capsule Take 1 capsule (100 mg total) by mouth 2 (two) times daily. Patient taking differently: Take 100 mg by mouth every other day. 06/06/23   Tobie Yetta HERO, MD  donepezil  (ARICEPT ) 10 MG tablet Take 10 mg by mouth at bedtime. 06/28/22   [provider]  HYDROmorphone  (DILAUDID ) 2 MG tablet Take 0.5 tablets (1 mg total) by mouth every 6 (six) hours as needed for  severe pain (pain score 7-10). 02/13/24   Cindy Garnette POUR, MD  lacosamide  150 MG TABS Take 1 tablet (150 mg total) by mouth 2 (two) times daily. Patient not taking: Reported on 02/07/2024 06/04/23   Tobie Yetta HERO, MD  latanoprost  (XALATAN ) 0.005 % ophthalmic solution Place 1 drop into both eyes at bedtime. 09/07/18   Cook, Jayce G, DO  levothyroxine  (SYNTHROID , LEVOTHROID) 112 MCG tablet Take 1 tablet (112 mcg total) by mouth daily before breakfast. 09/07/18   Cook, Jayce G, DO  polycarbophil (FIBERCON) 625 MG tablet Take 1 tablet (625 mg total) by mouth daily. 11/05/24   Haviland, Julie, MD  polyethylene glycol powder (GLYCOLAX /MIRALAX ) 17 GM/SCOOP powder Take 17 g by mouth daily. 02/14/24   Cindy Garnette POUR, MD  senna (SENOKOT) 8.6 MG TABS tablet Take 1 tablet (8.6 mg total) by mouth daily. 02/14/24   Cindy Garnette POUR, MD  sucroferric oxyhydroxide (VELPHORO ) 500 MG chewable tablet Chew 500 mg by mouth 3 (three) times daily with meals. 10/13/21   [provider]    Physical Exam: Vitals:   11/09/24 1100 11/09/24 1126 11/09/24 1158  BP:  (!) 162/102 (!) 153/96  Pulse:    88  Resp:   (!) 22  Temp:   98.9 F (37.2 C)  SpO2:   99%  Weight: 65.4 kg     Constitutional:  Elderly female who appears acutely altered Eyes: PERRL, lids and conjunctivae normal ENMT: Mucous membranes are moist.   Neck: normal, supple  Respiratory: clear to auscultation bilaterally, no wheezing, no crackles. Normal respiratory effort. No accessory muscle use.  Cardiovascular: Regular rate and rhythm, no murmurs / rubs / gallops.  Upper extremity fistula in place. Abdomen: no tenderness, no masses palpated.   Bowel sounds positive.  Musculoskeletal: no clubbing / cyanosis. No joint deformity upper and lower extremities.   Skin: no rashes, lesions, ulcers. No induration Neurologic: CN 2-12 grossly intact.  Expressive aphasia. Psychiatric: Alert and oriented to self.  Data Reviewed:  Reviewed labs, imaging and pertinent records as documented.  Assessment and Plan: Seizure Patient presents after having a witnessed seizure by her son at home.  She also had a seizure on 12/1 and was seen in the ED.  Patient has been without home antiepileptic medications for about a week.  Initial CT scan of the head did not reveal any acute abnormality.  Suspect symptoms secondary to a postictal phase. - Admit to a telemetry bed - Neurochecks - Seizure precautions - Check MRI of the brain - Follow-up EEG - Ativan  IV as needed for seizure activity - Appreciate neurology consultative services   End-stage renal disease on HD Patient on a Monday, Wednesday, and Friday schedule.  Last dialyzed on 12/1.   Labs noted potassium 4.3, CO2 24, BUN 57, creatinine 14.37, and anion gap 20. - Nephrology consulted for need of hemodialysis  Diverticulitis Prior to arrival.  Patient had been seen in the emergency department on 12/1 and had a CT scan which noted colonic diverticulosis with question of early inflammation about the descending colonic diverticula concerning for possible early diverticulitis.  Patient  had been started on Augmentin  at that time. - Continue Augmentin   Vascular dementia Patient had previously been on does not appear recently prescribed. - Delirum precautions  Anemia of chronic disease Hemoglobin noted to be 11.6 which appears stable. - Continue to monitor  History of breast cancer - Continue anastrozole   History of hypothyroidism Review of records note has not recently been prescribed levothyroxine . -  Check TSH - Verify if patient is supposed to be on levothyroxine  or not and resume if warranted  DVT prophylaxis: Heparin  Advance Care Planning:   Code Status: Full Code    Consults: Neurology  Family Communication: Son updated over the phone  Severity of Illness: The appropriate patient status for this patient is OBSERVATION. Observation status is judged to be reasonable and necessary in order to provide the required intensity of service to ensure the patient's safety. The patient's presenting symptoms, physical exam findings, and initial radiographic and laboratory data in the context of their medical condition is felt to place them at decreased risk for further clinical deterioration. Furthermore, it is anticipated that the patient will be medically stable for discharge from the hospital within 2 midnights of admission.   Author: Maximino DELENA Sharps, MD 11/09/2024 3:05 PM  For on call review [email or web address].

## 2024-11-09 NOTE — ED Notes (Signed)
 EDP aware of delay to labs and access due to difficult IV start. IV team consult placed.

## 2024-11-09 NOTE — Consult Note (Signed)
 [facility]  INPATIENT CONSULTATION  Reason for Consultation: ESRD Requesting Provider: Dr. Claudene  HPI: Kirsten Mcmahon is an 78 y.o. female with ESRD on HD MWF [facility], HTN, HL, seizure DO, h/o CVA L hemiparesis, DM currently admitted with a seizure and nephrology is consulted for comanagement of ESRD and assoc conditions.   Was off antiseizure medications for a week due to difficulty obtaining.  ED visit 12/1 for seizure thought to be precipitated by over UF at HD (per son).  She had abd pain with early diverticulitis noted on CT and was started on ESRD dose augmentin .   Had a witnessed seizure this AM at home and was brought in by EMS for such.  CT head showed no acute changes.  Neurology consulted and did loading dose of vimpat  and ativan .    Son is bedside this PM.   Patient has no complaints but is not currently oriented.  She is on cEEG currently.  She missed last 2 HD (today included) but did have most of session Monday.   PMH: Past Medical History:  Diagnosis Date   Anemia    Cancer (HCC) 2017   Right breast   ESRD on hemodialysis (HCC)    GERD (gastroesophageal reflux disease)    Glaucoma    Hemiparesis (HCC)    left side   High cholesterol    History of seizure    after a spider bite; 07/15/21   History of stroke with residual deficit    left-side weakness   Hypertension    states BP under control with meds., has been on med. x 2 yr.   Hypothyroidism    Non-insulin  dependent type 2 diabetes mellitus (HCC)    Overactive bladder    PFO (patent foramen ovale) 05/15/2021   Stroke (HCC)    1998 weakness on left side; 05/12/21   PSH: Past Surgical History:  Procedure Laterality Date   A/V FISTULAGRAM Right 09/21/2024   Procedure: A/V Fistulagram;  Surgeon: Sheree Penne Bruckner, MD;  Location: [facility];  Service: Cardiovascular;  Laterality: Right;   ABDOMINAL HYSTERECTOMY     complete   AV FISTULA PLACEMENT Right 09/29/2021   Procedure: INSERTION  OF RIGHT ARM ARTERIOVENOUS (AV) GORE-TEX GRAFT;  Surgeon: Eliza Bruckner RAMAN, MD;  Location: [facility];  Service: Vascular;  Laterality: Right;   BREAST BIOPSY Left 08/03/2018   Benign adipose tissue   BREAST BIOPSY Right 02/15/2023   US  RT BREAST BX W LOC DEV 1ST LESION IMG BX SPEC US  GUIDE 02/15/2023 [facility]   BREAST BIOPSY Left 02/15/2023   US  LT BREAST BX W LOC DEV 1ST LESION IMG BX SPEC US  GUIDE 02/15/2023 [facility]   BREAST EXCISIONAL BIOPSY Right 2014   Positive   BREAST LUMPECTOMY Right    BUBBLE STUDY  05/15/2021   Procedure: BUBBLE STUDY;  Surgeon: Jeffrie Oneil BROCKS, MD;  Location: [facility];  Service: Cardiovascular;;   CATARACT EXTRACTION W/ INTRAOCULAR LENS IMPLANT Left    CEREBRAL ANEURYSM REPAIR  1998   DIALYSIS/PERMA CATHETER INSERTION N/A 04/10/2021   Procedure: DIALYSIS/PERMA CATHETER INSERTION;  Surgeon: Marea Selinda RAMAN, MD;  Location: [facility];  Service: Cardiovascular;  Laterality: N/A;   IR FLUORO GUIDE CV LINE RIGHT  04/30/2021   MASTECTOMY MODIFIED RADICAL Right 12/08/2023   Procedure: RIGHT MODIFIED RADICAL MASTECTOMY;  Surgeon: Curvin Deward MOULD, MD;  Location: [facility];  Service: General;  Laterality: Right;   PICC LINE INSERTION     TEE  WITHOUT CARDIOVERSION N/A 05/15/2021   Procedure: TRANSESOPHAGEAL ECHOCARDIOGRAM (TEE);  Surgeon: Jeffrie Oneil BROCKS, MD;  Location: [facility] ENDOSCOPY;  Service: Cardiovascular;  Laterality: N/A;   THYROID  LOBECTOMY Right 07/15/2017   Procedure: RIGHT THYROID  LOBECTOMY;  Surgeon: Eletha Boas, MD;  Location: [facility] OR;  Service: General;  Laterality: Right;   VENOUS ANGIOPLASTY  09/21/2024   Procedure: VENOUS ANGIOPLASTY;  Surgeon: Sheree Penne Bruckner, MD;  Location: [facility];  Service: Cardiovascular;;  90% innominate/ 70% AVG    Past Medical History:  Diagnosis Date   Anemia    Cancer (HCC) 2017   Right breast   ESRD on hemodialysis (HCC)    GERD (gastroesophageal reflux disease)    Glaucoma    Hemiparesis (HCC)     left side   High cholesterol    History of seizure    after a spider bite; 07/15/21   History of stroke with residual deficit    left-side weakness   Hypertension    states BP under control with meds., has been on med. x 2 yr.   Hypothyroidism    Non-insulin  dependent type 2 diabetes mellitus (HCC)    Overactive bladder    PFO (patent foramen ovale) 05/15/2021   Stroke (HCC)    1998 weakness on left side; 05/12/21    Medications:  I have reviewed the patient's current medications.  Medications Prior to Admission  Medication Sig Dispense Refill   acetaminophen  (TYLENOL ) 500 MG tablet Take 500-1,000 mg by mouth daily as needed for moderate pain (pain score 4-6), fever or headache.     amoxicillin -clavulanate (AUGMENTIN ) 250-125 MG tablet Take 1 tablet by mouth daily. Take after dialysis on dialysis days. 5 tablet 0   anastrozole  (ARIMIDEX ) 1 MG tablet Take 1 tablet (1 mg total) by mouth daily. 90 tablet 3   aspirin  EC 81 MG tablet Take 81 mg by mouth daily.     B Complex-C-Zn-Folic Acid  (DIALYVITE 800-ZINC 15) 0.8 MG TABS Take 1 tablet by mouth daily.     bisacodyl  (DULCOLAX) 10 MG suppository Place 1 suppository (10 mg total) rectally as needed for moderate constipation. 12 suppository 0   calcitRIOL  (ROCALTROL ) 0.5 MCG capsule Take 1 capsule (0.5 mcg total) by mouth every Monday, Wednesday, and Friday with hemodialysis. 30 capsule 0   docusate sodium  (COLACE) 100 MG capsule Take 1 capsule (100 mg total) by mouth 2 (two) times daily. (Patient taking differently: Take 100 mg by mouth every other day.) 20 capsule 0   donepezil  (ARICEPT ) 10 MG tablet Take 10 mg by mouth at bedtime.     HYDROmorphone  (DILAUDID ) 2 MG tablet Take 0.5 tablets (1 mg total) by mouth every 6 (six) hours as needed for severe pain (pain score 7-10). 15 tablet 0   lacosamide  150 MG TABS Take 1 tablet (150 mg total) by mouth 2 (two) times daily. (Patient not taking: Reported on 02/07/2024) 60 tablet 0   latanoprost   (XALATAN ) 0.005 % ophthalmic solution Place 1 drop into both eyes at bedtime. 2.5 mL 0   levothyroxine  (SYNTHROID , LEVOTHROID) 112 MCG tablet Take 1 tablet (112 mcg total) by mouth daily before breakfast. 90 tablet 0   polycarbophil (FIBERCON) 625 MG tablet Take 1 tablet (625 mg total) by mouth daily. 30 tablet 0   polyethylene glycol powder (GLYCOLAX /MIRALAX ) 17 GM/SCOOP powder Take 17 g by mouth daily. 238 g 0   senna (SENOKOT) 8.6 MG TABS tablet Take 1 tablet (8.6 mg total) by mouth daily. 120 tablet 0  sucroferric oxyhydroxide (VELPHORO ) 500 MG chewable tablet Chew 500 mg by mouth 3 (three) times daily with meals.      ALLERGIES:   Allergies  Allergen Reactions   Aricept  [Donepezil ] Nausea And Vomiting   Contrast Media [Iodinated Contrast Media] Swelling   Latex Itching   Shellfish Allergy Other (See Comments)    Unknown reaction   Levemir [Insulin  Detemir] Itching    FAM HX: Family History  Problem Relation Age of Onset   Cancer Brother        possible prostate cancer per her daughter   Breast cancer Neg Hx     Social History:   reports that she has never smoked. She has never used smokeless tobacco. She reports that she does not drink alcohol  and does not use drugs.  ROS: unable to obtain due to patient factors  Blood pressure 138/75, pulse 85, temperature 97.9 F (36.6 C), temperature source Axillary, resp. rate 20, weight 65.4 kg, SpO2 94%. PHYSICAL EXAM: Gen: awake and comfortable, drinking sweet tea  Eyes: EOMI ENT: MMM Neck: supple CV: RRR Abd: soft Lungs: clear Extr: LUE 1+ pitting edema, no LE edema, RUE AVG +t/b Neuro: L hemiparesis, expressive aphasia but can say name and don't know to other questions   Results for orders placed or performed during the hospital encounter of 11/09/24 (from the past 48 hours)  CBG monitoring, ED     Status: Abnormal   Collection Time: 11/09/24 11:22 AM  Result Value Ref Range   Glucose-Capillary 117 (H) 70 - 99 mg/dL     Comment: Glucose reference range applies only to samples taken after fasting for at least 8 hours.  Protime-INR     Status: None   Collection Time: 11/09/24 12:44 PM  Result Value Ref Range   Prothrombin Time 14.4 11.4 - 15.2 seconds   INR 1.1 0.8 - 1.2    Comment: (NOTE) INR goal varies based on device and disease states. Performed at [facility], [address] [place], [place] [address]   APTT     Status: None   Collection Time: 11/09/24 12:44 PM  Result Value Ref Range   aPTT 27 24 - 36 seconds    Comment: Performed at [facility], [address] [place], [place] [address]  CBC     Status: Abnormal   Collection Time: 11/09/24 12:44 PM  Result Value Ref Range   WBC 9.0 4.0 - 10.5 K/uL   RBC 3.83 (L) 3.87 - 5.11 MIL/uL   Hemoglobin 11.6 (L) 12.0 - 15.0 g/dL   HCT 61.9 63.9 - 53.9 %   MCV 99.2 80.0 - 100.0 fL   MCH 30.3 26.0 - 34.0 pg   MCHC 30.5 30.0 - 36.0 g/dL   RDW 83.9 (H) 88.4 - 84.4 %   Platelets 282 150 - 400 K/uL   nRBC 0.0 0.0 - 0.2 %    Comment: Performed at [facility], [address], [place], [place] [address]  Differential     Status: None   Collection Time: 11/09/24 12:44 PM  Result Value Ref Range   Neutrophils Relative % 84 %   Neutro Abs 7.7 1.7 - 7.7 K/uL   Lymphocytes Relative 8 %   Lymphs Abs 0.7 0.7 - 4.0 K/uL   Monocytes Relative 5 %   Monocytes Absolute 0.4 0.1 - 1.0 K/uL   Eosinophils Relative 1 %   Eosinophils Absolute 0.1 0.0 - 0.5 K/uL   Basophils Relative 1 %  Basophils Absolute 0.1 0.0 - 0.1 K/uL   Immature Granulocytes 1 %   Abs Immature Granulocytes 0.05 0.00 - 0.07 K/uL    Comment: Performed at [facility], [address], [place], [place] [address]  Comprehensive metabolic panel     Status: Abnormal   Collection Time: 11/09/24 12:44 PM  Result Value Ref Range   Sodium 145 135 - 145 mmol/L   Potassium 4.3 3.5 - 5.1 mmol/L   Chloride 101 98 - 111 mmol/L   CO2 24 22 - 32 mmol/L   Glucose, Bld 134 (H)  70 - 99 mg/dL    Comment: Glucose reference range applies only to samples taken after fasting for at least 8 hours.   BUN 57 (H) 8 - 23 mg/dL   Creatinine, Ser 85.62 (H) 0.44 - 1.00 mg/dL   Calcium  7.8 (L) 8.9 - 10.3 mg/dL   Total Protein 7.3 6.5 - 8.1 g/dL   Albumin  3.3 (L) 3.5 - 5.0 g/dL   AST 18 15 - 41 U/L   ALT 11 0 - 44 U/L   Alkaline Phosphatase 75 38 - 126 U/L   Total Bilirubin 0.9 0.0 - 1.2 mg/dL   GFR, Estimated 2 (L) >60 mL/min    Comment: (NOTE) Calculated using the CKD-EPI Creatinine Equation (2021)    Anion gap 20 (H) 5 - 15    Comment: Performed at [facility], [address], [place], [place] [address]  Ethanol     Status: None   Collection Time: 11/09/24 12:44 PM  Result Value Ref Range   Alcohol , Ethyl (B) <15 <15 mg/dL    Comment: (NOTE) For medical purposes only. Performed at [facility], [address] [place], [place] [address]   I-stat chem 8, ED     Status: Abnormal   Collection Time: 11/09/24  1:03 PM  Result Value Ref Range   Sodium 143 135 - 145 mmol/L   Potassium 4.1 3.5 - 5.1 mmol/L   Chloride 103 98 - 111 mmol/L   BUN 61 (H) 8 - 23 mg/dL   Creatinine, Ser 84.89 (H) 0.44 - 1.00 mg/dL   Glucose, Bld 867 (H) 70 - 99 mg/dL    Comment: Glucose reference range applies only to samples taken after fasting for at least 8 hours.   Calcium , Ion 0.88 (LL) 1.15 - 1.40 mmol/L   TCO2 25 22 - 32 mmol/L   Hemoglobin 12.6 12.0 - 15.0 g/dL   HCT 62.9 63.9 - 53.9 %   Comment NOTIFIED PHYSICIAN     DG CHEST PORT 1 VIEW Result Date: 11/09/2024 CLINICAL DATA:  Seizure. EXAM: PORTABLE CHEST 1 VIEW COMPARISON:  05/18/2024 FINDINGS: Lung volumes are low. Prominent heart size, stable allowing for differences in technique. Aortic tortuosity. Subsegmental atelectasis at the left lung base. No pulmonary edema, pleural effusion, or pneumothorax. Surgical clips in the right chest wall and right thoracic inlet. IMPRESSION: Low lung volumes with subsegmental  atelectasis at the left lung base. Electronically Signed   By: [name]   On: 11/09/2024 17:03   CT HEAD CODE STROKE WO CONTRAST Result Date: 11/09/2024 EXAM: CT HEAD WITHOUT 11/09/2024 11:34:32 AM TECHNIQUE: CT of the head was performed without the administration of intravenous contrast. Automated exposure control, iterative reconstruction, and/or weight based adjustment of the mA/kV was utilized to reduce the radiation dose to as low as reasonably achievable. COMPARISON: Brain MRI 02/18/2024. Head CT 02/06/2024. CLINICAL HISTORY: 78 year old female  Neuro  deficit, acute, stroke suspected. FINDINGS: BRAIN AND VENTRICLES: Advanced calcified atherosclerosis at the skull base. No acute intracranial hemorrhage. No mass effect or midline shift. No extra-axial fluid collection. No hydrocephalus. Advanced chronic encephalomalacia in the right hemisphere, confluent in the right MCA middle and anterior divisions, right PCA territory. Superimposed moderate chronic bilateral cerebellar infarcts with encephalomalacia. Superimposed advanced chronic bilateral cerebral white matter hypodensity. Superimposed Wallerian degeneration in the deep brain nuclei and the brainstem. No suspicious intracranial vascular hyperdensity. No evidence of acute infarct. ORBITS: Rightward gaze. Chronic postoperative changes to the left globe. SINUSES AND MASTOIDS: Paranasal sinuses, tympanic cavities and mastoids are well aerated. SOFT TISSUES AND SKULL: Chronic right side craniotomy. No acute skull fracture. No acute soft tissue abnormality. [place] Stroke Program Early CT Score (ASPECTS) Ganglionic (caudate, IC, lentiform nucleus, insula, M1-M3): 7 Supraganglionic (M4-M6): 3 Total: 10 IMPRESSION: 1. Stable advanced chronic ischemic disease and encephalomalacia.  ASPECTS 10. 2. These results were communicated to Dr Vanessa at 11:41 hours on 11/09/2024 by text page via the [facility] messaging system. Electronically signed by: Helayne Hurst MD 11/09/2024 11:42 AM EST RP Workstation: [id number]    Assessment/PlanLee V Satterwhite is an 78 y.o. female with ESRD on HD MWF [facility], HTN, HL, seizure DO, h/o CVA L hemiparesis, DM currently admitted with a seizure and nephrology is consulted for comanagement of ESRD and assoc conditions.   **Seizure: while off meds, neuro following and has resumed vimpat  w loading dose and maintenance.  EEG running.  Possible CVA - MRI being obtained  **ESRD on HD:  MWF outpt; missed HD Wed and today.  Plan for HD overnight to prevent uremia from further complicating picture.  3h tx planned, 1-2L UF.   **Anemia: Hb 11.6, no indication for ESA  **HTN: current BP 120-130s, continue to monitor.  **BMM: cont home meds when eating.    Will follow, reach out with concerns.  Dw son bedside and primary MD.   Manuelita DELENA Barters 11/09/2024, 8:47 PM

## 2024-11-09 NOTE — ED Notes (Addendum)
 Patients son called and asked for an update. He is in the chart as her emergency contact so an update was given. He says he will be up here when she is settled into her room for the night.

## 2024-11-09 NOTE — Progress Notes (Signed)
 Patient hooked and recording with MRI safe leads. Patient is not currently being monitored by [facility] due to being in the ED.

## 2024-11-10 ENCOUNTER — Observation Stay (HOSPITAL_COMMUNITY)

## 2024-11-10 DIAGNOSIS — R569 Unspecified convulsions: Secondary | ICD-10-CM

## 2024-11-10 LAB — RENAL FUNCTION PANEL
Albumin: 2.9 g/dL — ABNORMAL LOW (ref 3.5–5.0)
Anion gap: 21 — ABNORMAL HIGH (ref 5–15)
BUN: 61 mg/dL — ABNORMAL HIGH (ref 8–23)
CO2: 23 mmol/L (ref 22–32)
Calcium: 7.4 mg/dL — ABNORMAL LOW (ref 8.9–10.3)
Chloride: 98 mmol/L (ref 98–111)
Creatinine, Ser: 14.89 mg/dL — ABNORMAL HIGH (ref 0.44–1.00)
GFR, Estimated: 2 mL/min — ABNORMAL LOW (ref >60)
Glucose, Bld: 177 mg/dL — ABNORMAL HIGH (ref 70–99)
Phosphorus: 9 mg/dL — ABNORMAL HIGH (ref 2.5–4.6)
Potassium: 3.6 mmol/L (ref 3.5–5.1)
Sodium: 142 mmol/L (ref 135–145)

## 2024-11-10 LAB — CBC
HCT: 35.5 % — ABNORMAL LOW (ref 36.0–46.0)
Hemoglobin: 11 g/dL — ABNORMAL LOW (ref 12.0–15.0)
MCH: 30.1 pg (ref 26.0–34.0)
MCHC: 31 g/dL (ref 30.0–36.0)
MCV: 97 fL (ref 80.0–100.0)
Platelets: 227 K/uL (ref 150–400)
RBC: 3.66 MIL/uL — ABNORMAL LOW (ref 3.87–5.11)
RDW: 15.9 % — ABNORMAL HIGH (ref 11.5–15.5)
WBC: 7.6 K/uL (ref 4.0–10.5)
nRBC: 0 % (ref 0.0–0.2)

## 2024-11-10 MED ORDER — HEPARIN SODIUM (PORCINE) 1000 UNIT/ML DIALYSIS: 1000.0000 [IU] | INTRAMUSCULAR | Status: DC | PRN

## 2024-11-10 MED ORDER — ANTICOAGULANT SODIUM CITRATE 4% (200MG/5ML) IV SOLN: 5.0000 mL | Status: DC | PRN

## 2024-11-10 MED ORDER — DONEPEZIL HCL 10 MG PO TABS
10.0000 mg | ORAL_TABLET | Freq: Every day | ORAL | Status: DC
  Administered 2024-11-10: 10 mg via ORAL
  Filled 2024-11-10: qty 1

## 2024-11-10 MED ORDER — LIDOCAINE-PRILOCAINE 2.5-2.5 % EX CREA: 1.0000 | TOPICAL_CREAM | CUTANEOUS | Status: DC | PRN

## 2024-11-10 MED ORDER — HYDRALAZINE HCL 20 MG/ML IJ SOLN
10.0000 mg | Freq: Four times a day (QID) | INTRAMUSCULAR | Status: DC | PRN
  Filled 2024-11-10: qty 1

## 2024-11-10 MED ORDER — ONDANSETRON HCL 4 MG/2ML IJ SOLN
4.0000 mg | Freq: Four times a day (QID) | INTRAMUSCULAR | Status: DC | PRN
  Administered 2024-11-10: 4 mg via INTRAVENOUS
  Filled 2024-11-10: qty 2

## 2024-11-10 MED ORDER — ASPIRIN 81 MG PO TBEC
81.0000 mg | DELAYED_RELEASE_TABLET | Freq: Every day | ORAL | Status: DC
  Administered 2024-11-10 – 2024-11-11 (×2): 81 mg via ORAL
  Filled 2024-11-10 (×2): qty 1

## 2024-11-10 MED ORDER — LEVOTHYROXINE SODIUM 112 MCG PO TABS
112.0000 ug | ORAL_TABLET | Freq: Every day | ORAL | Status: DC
  Administered 2024-11-11: 112 ug via ORAL
  Filled 2024-11-10: qty 1

## 2024-11-10 MED ORDER — LIDOCAINE HCL (PF) 1 % IJ SOLN: 5.0000 mL | INTRAMUSCULAR | Status: DC | PRN

## 2024-11-10 MED ORDER — PENTAFLUOROPROP-TETRAFLUOROETH EX AERO: 1.0000 | INHALATION_SPRAY | CUTANEOUS | Status: DC | PRN

## 2024-11-10 MED ORDER — ALTEPLASE 2 MG IJ SOLR: 2.0000 mg | Freq: Once | INTRAMUSCULAR | Status: DC | PRN

## 2024-11-10 NOTE — Progress Notes (Signed)
 EEG equipment transferred from ED to [phone] with patient overnight. [facility] now monitoring. LTM maintenance complete - no skin breakdown seen under Fp1, Fp2. Some impedances tech was unable to get below 10 kOhms due to pt hairstyle and sensitive scalp.

## 2024-11-10 NOTE — Progress Notes (Signed)
 LTM VIDEO EEG discontinued - no skin breakdown at [facility].

## 2024-11-10 NOTE — Progress Notes (Signed)
 SLP Cancellation Note  Patient Details Name: Kirsten Mcmahon MRN: [id number] DOB: 02/14/1946   Cancelled treatment:       Reason Eval/Treat Not Completed: Fatigue/lethargy limiting ability to participate    Cognitive/linguistic evaluation was attempted and patient was not alert enough.  At baseline, son reported patient is oriented to her self and interacts with those around her.  However, he has noticed she will stare off into space at times and that she will chew on her clothing or blankets.  ST will continue efforts to complete evaluation.   Kirsten Eagles, MA, CCC-SLP Acute Rehab SLP [phone]  Kirsten Mcmahon 11/10/2024, 10:55 AM

## 2024-11-10 NOTE — Progress Notes (Signed)
 [facility] Progress Note   Subjective:   Patient seen and examined at bedside.  Able to provide name but no other orientation questions.  Does not answer ROS questions either but does follow some simple commands.   Objective Vitals:   11/10/24 0600 11/10/24 0800 11/10/24 1200 11/10/24 1214  BP:  (!) 146/61 (!) 163/74 (!) 153/69  Pulse:  84 84 83  Resp:  16 16 (!) 24  Temp:  97.6 F (36.4 C) 97.7 F (36.5 C)   TempSrc:  Axillary    SpO2:    97%  Weight: 66.8 kg  66.5 kg    Physical Exam General:alert, confused female in NAD Heart:RRR Lungs:CTAB, nml WOB on RA Abdomen:soft, NTND Extremities:no LE edema Dialysis Access: RU AVG +b/t   Filed Weights   11/09/24 1100 11/10/24 0600 11/10/24 1200  Weight: 65.4 kg 66.8 kg 66.5 kg   No intake or output data in the 24 hours ending 11/10/24 1227  Additional Objective Labs: Basic Metabolic Panel: Recent Labs  Lab 11/05/24 1655 11/09/24 1244 11/09/24 1303 11/10/24 0356  NA 141 145 143 142  K 3.3* 4.3 4.1 3.6  CL 97* 101 103 98  CO2 26 24  --  23  GLUCOSE 225* 134* 132* 177*  BUN 25* 57* 61* 61*  CREATININE 7.38* 14.37* 15.10* 14.89*  CALCIUM  7.6* 7.8*  --  7.4*  PHOS  --   --   --  9.0*   Liver Function Tests: Recent Labs  Lab 11/05/24 1655 11/09/24 1244 11/10/24 0356  AST 17 18  --   ALT 13 11  --   ALKPHOS 80 75  --   BILITOT 0.6 0.9  --   PROT 7.2 7.3  --   ALBUMIN  3.1* 3.3* 2.9*   CBC: Recent Labs  Lab 11/05/24 1655 11/09/24 1244 11/09/24 1303 11/10/24 0356  WBC 10.4 9.0  --  7.6  NEUTROABS 8.9* 7.7  --   --   HGB 12.2 11.6* 12.6 11.0*  HCT 39.2 38.0 37.0 35.5*  MCV 98.0 99.2  --  97.0  PLT 209 282  --  227   Blood Culture    Component Value Date/Time   SDES BLOOD LEFT HAND 02/10/2024 0705   SPECREQUEST  02/10/2024 0705    BOTTLES DRAWN AEROBIC AND ANAEROBIC Blood Culture adequate volume   CULT  02/10/2024 0705    NO GROWTH 5 DAYS Performed at [facility], [address] [place], [place] [address]    REPTSTATUS 02/15/2024 FINAL 02/10/2024 0705    CBG: Recent Labs  Lab 11/09/24 1122  GLUCAP 117*    Studies/Results: Overnight EEG with video Result Date: 11/10/2024 [name], MD     11/10/2024  8:04 AM Patient Name: Kirsten Mcmahon MRN: [id number] Epilepsy Attending: [name] Referring Physician/Provider: [name], [name], MD Duration: 11/09/2024 1557 to 12/6/205 0730 Patient history: 78yo F presented with L sied weakness and R gaze. Reported hx of twitching on the left, followed by seizure. EEG to evaluate for seizure Level of alertness: Awake, asleep AEDs during EEG study: LCM Technical aspects: This EEG study was done with scalp electrodes positioned according to the 10-20 International system of electrode placement. Electrical activity was reviewed with band pass filter of 1-70Hz , sensitivity of 7 uV/mm, display speed of 77mm/sec with a 60Hz  notched filter applied as appropriate. EEG data were recorded continuously and digitally stored.  Video monitoring was available and reviewed as appropriate. Description: No clear posterior dominant  rhythm was seen. Sleep was characterized by sleep spindles (12 to 14 Hz), maximal frontocentral region.  EEG showed continuous generalized 3 to 6 Hz theta-delta slowing. There are also sharply contoured waves in the right centro-parietal region consistent with underlying breach artifact. Spikes were noted in right centro-parietal region. Hyperventilation and photic stimulation were not performed.    ABNORMALITY -Spike,right centro-parietal region -Breach artifact, right centro-parietal region -Continuous slow, generalized  IMPRESSION: This study is consistent with patient's history of epilepsy arising from centro-parietal region. Additionally there is evidence of cortical dysfunction in right centro-parietal region consistent with prior craniotomy and underlying stroke. Lastly there is generalized cerebral  dysfunction (encephalopathy).  No seizures were seen throughout the recording.  [name]  DG CHEST PORT 1 VIEW Result Date: 11/09/2024 CLINICAL DATA:  Seizure. EXAM: PORTABLE CHEST 1 VIEW COMPARISON:  05/18/2024 FINDINGS: Lung volumes are low. Prominent heart size, stable allowing for differences in technique. Aortic tortuosity. Subsegmental atelectasis at the left lung base. No pulmonary edema, pleural effusion, or pneumothorax. Surgical clips in the right chest wall and right thoracic inlet. IMPRESSION: Low lung volumes with subsegmental atelectasis at the left lung base. Electronically Signed   By: [name]   On: 11/09/2024 17:03   CT HEAD CODE STROKE WO CONTRAST Result Date: 11/09/2024 EXAM: CT HEAD WITHOUT 11/09/2024 11:34:32 AM TECHNIQUE: CT of the head was performed without the administration of intravenous contrast. Automated exposure control, iterative reconstruction, and/or weight based adjustment of the mA/kV was utilized to reduce the radiation dose to as low as reasonably achievable. COMPARISON: Brain MRI 02/18/2024. Head CT 02/06/2024. CLINICAL HISTORY: 78 year old female  Neuro deficit, acute, stroke suspected. FINDINGS: BRAIN AND VENTRICLES: Advanced calcified atherosclerosis at the skull base. No acute intracranial hemorrhage. No mass effect or midline shift. No extra-axial fluid collection. No hydrocephalus. Advanced chronic encephalomalacia in the right hemisphere, confluent in the right MCA middle and anterior divisions, right PCA territory. Superimposed moderate chronic bilateral cerebellar infarcts with encephalomalacia. Superimposed advanced chronic bilateral cerebral white matter hypodensity. Superimposed Wallerian degeneration in the deep brain nuclei and the brainstem. No suspicious intracranial vascular hyperdensity. No evidence of acute infarct. ORBITS: Rightward gaze. Chronic postoperative changes to the left globe. SINUSES AND MASTOIDS: Paranasal sinuses,  tympanic cavities and mastoids are well aerated. SOFT TISSUES AND SKULL: Chronic right side craniotomy. No acute skull fracture. No acute soft tissue abnormality. [place] Stroke Program Early CT Score (ASPECTS) Ganglionic (caudate, IC, lentiform nucleus, insula, M1-M3): 7 Supraganglionic (M4-M6): 3 Total: 10 IMPRESSION: 1. Stable advanced chronic ischemic disease and encephalomalacia.  ASPECTS 10. 2. These results were communicated to Dr Vanessa at 11:41 hours on 11/09/2024 by text page via the [facility] messaging system. Electronically signed by: Helayne Hurst MD 11/09/2024 11:42 AM EST RP Workstation: [id number]    Medications:  anticoagulant sodium citrate       anastrozole   1 mg Oral Daily   aspirin  EC  81 mg Oral Daily   [START ON 11/12/2024] calcitRIOL   0.5 mcg Oral Q M,W,F-HD   Chlorhexidine  Gluconate Cloth  6 each Topical Q0600   donepezil   10 mg Oral QHS   heparin   5,000 Units Subcutaneous Q8H   lacosamide   150 mg Oral BID   [START ON 11/11/2024] levothyroxine   112 mcg Oral QAC breakfast   sodium chloride  flush  3 mL Intravenous Once    Dialysis Orders: MWF [facility]  3.75hrs, 350/AF 1.5 62.4kg 2K 2.5Ca AVG Heparin  2000 units qHD Calcitriol  1mcg qHD  Assessment/Plan: 1. Seizures -  while off meds. Neuro consulting.  EEG consistent w/Hx epilepsy, craniotomy, stroke and encephalopathy  No seizures seen throughout recording. CT head with no acute findings, showed stable advanced chronic ischemic disease and encephalomalacia. Per Neuro/PMD. 2. ESRD - on HD MWF. HD completed overnight.  No complications reported. Outpatient records show recent issues with agitation/restlessness during HD.  Center has requested for patient to have sitter for treatments moving forward as outpatient d/t safety concerns.  3. Anemia of CKD- Hgb 11. No indication for ESA.  4. Secondary hyperparathyroidism - Ca low, phos high.  Usually in goal.  If remains elevated will add binders. Not on binders a outpatient.  5.  HTN/volume - BP elevated today. Continue home meds.  Does not appear volume overloaded.  UF as tolerated.   6. Nutrition - Renal diet w/fluid restrictions when eating.   Manuelita Labella, PA-C [facility] 11/10/2024,12:27 PM  LOS: 0 days

## 2024-11-10 NOTE — Evaluation (Addendum)
 Physical Therapy Evaluation Patient Details Name: Kirsten Mcmahon MRN: [id number] DOB: Sep 01, 1946 Today's Date: 11/10/2024  History of Present Illness  78 y.o.  female presented 12/5 with breakthrough seizures. Withprior history of CVA with left-sided weakness, vascular dementia, ESRD on HD, seizure disorder.  Clinical Impression  Pt admitted with above diagnosis. Limited historian, unable to reach son Darold by phone when called this morning. Reportedly requires assist for transfers to w/c at home. Pt demonstrates poor trunk control sitting EOB preventing safe transfer without +2 assist at this time. She does state she feels moe unstable than baseline. Mod-Max assist required for bed mobility, and mod assist just to sit EOB, leaning posteriorly. Pt had an episode of spontaneous N&V with poor clearance of emesis requiring suction to fully expel. RN notified and in room to assume care. No overt choking noted, and pt states she feels much better after episode. HOB elevated with bed in modified chair position. Pt currently with functional limitations due to the deficits listed below (see PT Problem List). Pt will benefit from acute skilled PT to increase their independence and safety with mobility to allow discharge.       Patient will benefit from continued inpatient follow up therapy, <3 hours/day. If son feels confident with ability to adequately care for her at home given high needs, she may benefit from hoyer lift based on assessment today. We will continue to follow an monitor for progress.     If plan is discharge home, recommend the following: Two people to help with walking and/or transfers;Assistance with cooking/housework;Two people to help with bathing/dressing/bathroom;Assistance with feeding;Assist for transportation;Help with stairs or ramp for entrance;Supervision due to cognitive status   Can travel by private vehicle   No    Equipment Recommendations Deitra lift (If taking home)   Recommendations for Other Services       Functional Status Assessment Patient has had a recent decline in their functional status and demonstrates the ability to make significant improvements in function in a reasonable and predictable amount of time.     Precautions / Restrictions Precautions Precautions: Fall Recall of Precautions/Restrictions: Impaired Restrictions Weight Bearing Restrictions Per Provider Order: No      Mobility  Bed Mobility Overal bed mobility: Needs Assistance Bed Mobility: Rolling, Sidelying to Sit, Sit to Supine Rolling: Mod assist Sidelying to sit: Max assist, HOB elevated   Sit to supine: Max assist   General bed mobility comments: Mod assist to roll, assist with LEs off bed, and max assist for trunk to rise. Demonstrates posterior lean upon sitting. Max assist for LE and Trunk support back into bed. Cues throughout for technique.    Transfers                   General transfer comment: Unable to keep trunk forward long enough to safely attempt, will require +2 assist    Ambulation/Gait                  Stairs            Wheelchair Mobility     Tilt Bed    Modified Rankin (Stroke Patients Only)       Balance Overall balance assessment: Needs assistance Sitting-balance support: Bilateral upper extremity supported Sitting balance-[name] Scale: Poor Sitting balance - Comments: Sat EOB majority of visit and tolerated well but needs assist at all times for balance. Postural control: Posterior lean  Pertinent Vitals/Pain Pain Assessment Pain Assessment: No/denies pain    Home Living Family/patient expects to be discharged to:: Private residence Living Arrangements: Children Available Help at Discharge: Family Type of Home: House Home Access: Ramped entrance       Home Layout: One level Home Equipment: BSC/3in1;Other (comment);Hospital bed;Tub bench;Grab bars -  tub/shower;Hand held shower head;Grab bars - toilet;Cane - single point;Wheelchair - manual Additional Comments: Information obtained from prior admission - pt able to provide info but unsure of accuracy - unable to reach son Darold via phone number in chart.    Prior Function Prior Level of Function : Needs assist;Patient poor historian/Family not available             Mobility Comments: Pt states son lift pt into w/c and transports her to dialysis in w/c. States she does not ambulate. ADLs Comments: States she requires assistance with care. Limited specifics     Extremity/Trunk Assessment   Upper Extremity Assessment Upper Extremity Assessment: Defer to OT evaluation    Lower Extremity Assessment Lower Extremity Assessment: LLE deficits/detail;Generalized weakness;Difficult to assess due to impaired cognition LLE Deficits / Details: Hx of Lt hemiplegia - pt able to move LLE modestly, unable to SLR agains gravity.       Communication   Communication Communication: Impaired Factors Affecting Communication: Reduced clarity of speech;Difficulty expressing self    Cognition Arousal: Alert Behavior During Therapy: Flat affect   PT - Cognitive impairments: No family/caregiver present to determine baseline, Awareness, Attention, Initiation, Sequencing, Problem solving, Safety/Judgement                         Following commands: Impaired Following commands impaired: Follows one step commands inconsistently, Follows one step commands with increased time     Cueing Cueing Techniques: Verbal cues, Gestural cues, Tactile cues, Visual cues     General Comments General comments (skin integrity, edema, etc.): Pt with spontaneous episode of N&V while sitting upright in bed at end of session. Able to weakly clear but required suction. RN present and supporting shortly after episode. No notable choking but showed poor reflex for full clearance out of mouth. Vitals remained  stable without fluctuation.    Exercises General Exercises - Lower Extremity Long Arc Quad: Strengthening, Right, 10 reps, Seated   Assessment/Plan    PT Assessment Patient needs continued PT services  PT Problem List Decreased strength;Decreased range of motion;Decreased activity tolerance;Decreased balance;Decreased mobility;Decreased coordination;Decreased cognition;Decreased knowledge of use of DME;Decreased safety awareness;Decreased knowledge of precautions;Obesity;Impaired tone       PT Treatment Interventions DME instruction;Gait training;Functional mobility training;Therapeutic activities;Therapeutic exercise;Balance training;Neuromuscular re-education;Cognitive remediation;Patient/family education;Wheelchair mobility training    PT Goals (Current goals can be found in the Care Plan section)  Acute Rehab PT Goals Patient Stated Goal: none stated PT Goal Formulation: Patient unable to participate in goal setting Time For Goal Achievement: 11/24/24 Potential to Achieve Goals: Fair    Frequency Min 2X/week     Co-evaluation               AM-PAC PT 6 Clicks Mobility  Outcome Measure Help needed turning from your back to your side while in a flat bed without using bedrails?: A Lot Help needed moving from lying on your back to sitting on the side of a flat bed without using bedrails?: A Lot Help needed moving to and from a bed to a chair (including a wheelchair)?: Total Help needed standing up from a chair using your  arms (e.g., wheelchair or bedside chair)?: Total Help needed to walk in hospital room?: Total Help needed climbing 3-5 steps with a railing? : Total 6 Click Score: 8    End of Session Equipment Utilized During Treatment: Gait belt Activity Tolerance: Patient tolerated treatment well Patient left: in bed;with call bell/phone within reach;with bed alarm set;with nursing/sitter in room (Sitting upright  back supported in bed.) Nurse Communication:  Mobility status;Other (comment) (vomiting episode) PT Visit Diagnosis: Unsteadiness on feet (R26.81);Other abnormalities of gait and mobility (R26.89);Hemiplegia and hemiparesis;Other symptoms and signs involving the nervous system (R29.898);Difficulty in walking, not elsewhere classified (R26.2);Muscle weakness (generalized) (M62.81) Hemiplegia - Right/Left: Left Hemiplegia - caused by:  (prior CVA)    Time: 9146-9077 PT Time Calculation (min) (ACUTE ONLY): 29 min   Charges:   PT Evaluation $PT Eval Moderate Complexity: 1 Mod PT Treatments $Therapeutic Activity: 8-22 mins PT General Charges $$ ACUTE PT VISIT: 1 Visit         Leontine Roads, PT, DPT [facility]  Rehabilitation Services Physical Therapist Office: [phone] Website: [email or web address]   Leontine GORMAN Roads 11/10/2024, 11:25 AM

## 2024-11-10 NOTE — Plan of Care (Signed)

## 2024-11-10 NOTE — Plan of Care (Signed)
  Problem: Ischemic Stroke/TIA Tissue Perfusion: Goal: Complications of ischemic stroke/TIA will be minimized Outcome: Progressing   Problem: Nutrition: Goal: Risk of aspiration will decrease Outcome: Progressing   Problem: Clinical Measurements: Goal: Diagnostic test results will improve Outcome: Progressing   Problem: Nutrition: Goal: Adequate nutrition will be maintained Outcome: Progressing

## 2024-11-10 NOTE — Progress Notes (Addendum)
 PROGRESS NOTE        PATIENT DETAILS Name: Kirsten Mcmahon Age: 78 y.o. Sex: female Date of Birth: 1946/08/21 Admit Date: 11/09/2024 Admitting Physician Maximino DELENA Sharps, MD ERE:[facility]  Brief Summary: Patient is a 78 y.o.  female with prior history of CVA with left-sided weakness, vascular dementia, ESRD on HD, seizure disorder who presented with breakthrough seizures.  Significant events: 12/5>> admit to [facility]  Significant studies: 12/5>> CT head: No acute abnormality 12/5>> CXR: No PNA  Significant microbiology data: None  Procedures: 12/5-12/6>> evidence of epileptogenicity-central parietal region-but no obvious seizures.  Consults: Neurology Nephrology  Subjective: Awake-appears pleasantly confused-answers some questions appropriately.  No family at bedside.  Objective: Vitals: Blood pressure (!) 146/61, pulse 84, temperature 97.6 F (36.4 C), temperature source Axillary, resp. rate 16, weight 66.8 kg, SpO2 98%.   Exam: Gen Exam:Alert awake-not in any distress HEENT:atraumatic, normocephalic Chest: B/L clear to auscultation anteriorly CVS:S1S2 regular Abdomen:soft non tender, non distended Extremities:no edema Neurology: Left-sided hemiplegia Skin: no rash  Pertinent Labs/Radiology:    Latest Ref Rng & Units 11/10/2024    3:56 AM 11/09/2024    1:03 PM 11/09/2024   12:44 PM  CBC  WBC 4.0 - 10.5 K/uL 7.6   9.0   Hemoglobin 12.0 - 15.0 g/dL 88.9  87.3  88.3   Hematocrit 36.0 - 46.0 % 35.5  37.0  38.0   Platelets 150 - 400 K/uL 227   282     Lab Results  Component Value Date   NA 142 11/10/2024   K 3.6 11/10/2024   CL 98 11/10/2024   CO2 23 11/10/2024      Assessment/Plan: Breakthrough seizure No obvious seizures overnight Loaded with Vimpat  on admission-currently on maintenance dose of Vimpat  LTM EEG overnight negative for seizures Awaiting MRI brain Await further recommendations from  neurology.  ESRD on HD MWF Missed HD a day prior to this hospitalization Nephrology following-and directing HD schedule.  Recent history of acute diverticulitis Continue Augmentin  (started prior to this hospitalization) Abdominal exam is benign.  Normocytic anemia Mild Likely secondary to ESRD Defer Aranesp /iron therapies to nephrology service  History of CVA with left-sided hemiparesis On ASA  Hypothyroidism TSH stable Discussed with patient's son-patient is indeed taking levothyroxine  at home-Will resume.  History of breast cancer Anastrozole  Outpatient follow-up with oncology  Vascular dementia Pleasantly confused Delirium precautions. Resume Aricept   Code status:   Code Status: Full Code   DVT Prophylaxis: heparin  injection 5,000 Units Start: 11/09/24 2200   Family Communication: Son-Maurice-[phone] -updated over the phone 12/6   Disposition Plan: Status is: Observation The patient will require care spanning > 2 midnights and should be moved to inpatient because: Severity of illness   Planned Discharge Destination:Home health   Diet: Diet Order             Diet renal with fluid restriction Fluid restriction: 1200 mL Fluid; Room service appropriate? Yes; Fluid consistency: Thin  Diet effective now                     Antimicrobial agents: Anti-infectives (From admission, onward)    Start     Dose/Rate Route Frequency Ordered Stop   11/10/24 0800  amoxicillin -clavulanate (AUGMENTIN ) 250-125 MG per tablet 1 tablet       Note to Pharmacy: Take after dialysis on dialysis  days.     1 tablet Oral Once 11/09/24 1614 11/10/24 0828        MEDICATIONS: Scheduled Meds:   stroke: early stages of recovery book   Does not apply Once   anastrozole   1 mg Oral Daily   [START ON 11/12/2024] calcitRIOL   0.5 mcg Oral Q M,W,F-HD   Chlorhexidine  Gluconate Cloth  6 each Topical Q0600   heparin   5,000 Units Subcutaneous Q8H   lacosamide   150 mg Oral BID    sodium chloride  flush  3 mL Intravenous Once   Continuous Infusions:  anticoagulant sodium citrate      PRN Meds:.acetaminophen  **OR** acetaminophen , albuterol , alteplase , anticoagulant sodium citrate , heparin , lidocaine  (PF), lidocaine -prilocaine , LORazepam , pentafluoroprop-tetrafluoroeth   I have personally reviewed following labs and imaging studies  LABORATORY DATA: CBC: Recent Labs  Lab 11/05/24 1655 11/09/24 1244 11/09/24 1303 11/10/24 0356  WBC 10.4 9.0  --  7.6  NEUTROABS 8.9* 7.7  --   --   HGB 12.2 11.6* 12.6 11.0*  HCT 39.2 38.0 37.0 35.5*  MCV 98.0 99.2  --  97.0  PLT 209 282  --  227    Basic Metabolic Panel: Recent Labs  Lab 11/05/24 1655 11/09/24 1244 11/09/24 1303 11/10/24 0356  NA 141 145 143 142  K 3.3* 4.3 4.1 3.6  CL 97* 101 103 98  CO2 26 24  --  23  GLUCOSE 225* 134* 132* 177*  BUN 25* 57* 61* 61*  CREATININE 7.38* 14.37* 15.10* 14.89*  CALCIUM  7.6* 7.8*  --  7.4*  MG 2.1  --   --   --   PHOS  --   --   --  9.0*    GFR: Estimated Creatinine Clearance: 2.8 mL/min (A) (by C-G formula based on SCr of 14.89 mg/dL (H)).  Liver Function Tests: Recent Labs  Lab 11/05/24 1655 11/09/24 1244 11/10/24 0356  AST 17 18  --   ALT 13 11  --   ALKPHOS 80 75  --   BILITOT 0.6 0.9  --   PROT 7.2 7.3  --   ALBUMIN  3.1* 3.3* 2.9*   No results for input(s): LIPASE, AMYLASE in the last 168 hours. No results for input(s): AMMONIA in the last 168 hours.  Coagulation Profile: Recent Labs  Lab 11/09/24 1244  INR 1.1    Cardiac Enzymes: No results for input(s): CKTOTAL, CKMB, CKMBINDEX, TROPONINI in the last 168 hours.  BNP (last 3 results) No results for input(s): PROBNP in the last 8760 hours.  Lipid Profile: No results for input(s): CHOL, HDL, LDLCALC, TRIG, CHOLHDL, LDLDIRECT in the last 72 hours.  Thyroid  Function Tests: Recent Labs    11/09/24 2224  TSH 4.248    Anemia Panel: No results for  input(s): VITAMINB12, FOLATE, FERRITIN, TIBC, IRON, RETICCTPCT in the last 72 hours.  Urine analysis:    Component Value Date/Time   COLORURINE YELLOW 02/07/2024 0231   APPEARANCEUR TURBID (A) 02/07/2024 0231   LABSPEC 1.011 02/07/2024 0231   PHURINE 8.0 02/07/2024 0231   GLUCOSEU NEGATIVE 02/07/2024 0231   HGBUR SMALL (A) 02/07/2024 0231   BILIRUBINUR NEGATIVE 02/07/2024 0231   KETONESUR NEGATIVE 02/07/2024 0231   PROTEINUR 100 (A) 02/07/2024 0231   NITRITE NEGATIVE 02/07/2024 0231   LEUKOCYTESUR MODERATE (A) 02/07/2024 0231    Sepsis Labs: Lactic Acid, Venous    Component Value Date/Time   LATICACIDVEN 1.4 05/17/2021 1138    MICROBIOLOGY: Recent Results (from the past 240 hours)  MRSA Next Gen by PCR, Nasal  Status: None   Collection Time: 11/09/24  8:00 PM   Specimen: Nasal Mucosa; Nasal Swab  Result Value Ref Range Status   MRSA by PCR Next Gen NOT DETECTED NOT DETECTED Final    Comment: (NOTE) The GeneXpert MRSA Assay (FDA approved for NASAL specimens only), is one component of a comprehensive MRSA colonization surveillance program. It is not intended to diagnose MRSA infection nor to guide or monitor treatment for MRSA infections. Test performance is not FDA approved in patients less than 63 years old. Performed at [facility], [address] [place], [place] [address]     RADIOLOGY STUDIES/RESULTS: Overnight EEG with video Result Date: 11/10/2024 [name], MD     11/10/2024  8:04 AM Patient Name: TOMMI CREPEAU MRN: [id number] Epilepsy Attending: [name] Referring Physician/Provider: [name], [name], MD Duration: 11/09/2024 1557 to 12/6/205 0730 Patient history: 78yo F presented with L sied weakness and R gaze. Reported hx of twitching on the left, followed by seizure. EEG to evaluate for seizure Level of alertness: Awake, asleep AEDs during EEG study: LCM Technical aspects: This EEG study was done with scalp electrodes  positioned according to the 10-20 International system of electrode placement. Electrical activity was reviewed with band pass filter of 1-70Hz , sensitivity of 7 uV/mm, display speed of 17mm/sec with a 60Hz  notched filter applied as appropriate. EEG data were recorded continuously and digitally stored.  Video monitoring was available and reviewed as appropriate. Description: No clear posterior dominant rhythm was seen. Sleep was characterized by sleep spindles (12 to 14 Hz), maximal frontocentral region.  EEG showed continuous generalized 3 to 6 Hz theta-delta slowing. There are also sharply contoured waves in the right centro-parietal region consistent with underlying breach artifact. Spikes were noted in right centro-parietal region. Hyperventilation and photic stimulation were not performed.    ABNORMALITY -Spike,right centro-parietal region -Breach artifact, right centro-parietal region -Continuous slow, generalized  IMPRESSION: This study is consistent with patient's history of epilepsy arising from centro-parietal region. Additionally there is evidence of cortical dysfunction in right centro-parietal region consistent with prior craniotomy and underlying stroke. Lastly there is generalized cerebral dysfunction (encephalopathy).  No seizures were seen throughout the recording.  [name]  DG CHEST PORT 1 VIEW Result Date: 11/09/2024 CLINICAL DATA:  Seizure. EXAM: PORTABLE CHEST 1 VIEW COMPARISON:  05/18/2024 FINDINGS: Lung volumes are low. Prominent heart size, stable allowing for differences in technique. Aortic tortuosity. Subsegmental atelectasis at the left lung base. No pulmonary edema, pleural effusion, or pneumothorax. Surgical clips in the right chest wall and right thoracic inlet. IMPRESSION: Low lung volumes with subsegmental atelectasis at the left lung base. Electronically Signed   By: [name]   On: 11/09/2024 17:03   CT HEAD CODE STROKE WO CONTRAST Result Date:  11/09/2024 EXAM: CT HEAD WITHOUT 11/09/2024 11:34:32 AM TECHNIQUE: CT of the head was performed without the administration of intravenous contrast. Automated exposure control, iterative reconstruction, and/or weight based adjustment of the mA/kV was utilized to reduce the radiation dose to as low as reasonably achievable. COMPARISON: Brain MRI 02/18/2024. Head CT 02/06/2024. CLINICAL HISTORY: 78 year old female  Neuro deficit, acute, stroke suspected. FINDINGS: BRAIN AND VENTRICLES: Advanced calcified atherosclerosis at the skull base. No acute intracranial hemorrhage. No mass effect or midline shift. No extra-axial fluid collection. No hydrocephalus. Advanced chronic encephalomalacia in the right hemisphere, confluent in the right MCA middle and anterior divisions, right PCA territory. Superimposed moderate chronic bilateral cerebellar infarcts with encephalomalacia. Superimposed  advanced chronic bilateral cerebral white matter hypodensity. Superimposed Wallerian degeneration in the deep brain nuclei and the brainstem. No suspicious intracranial vascular hyperdensity. No evidence of acute infarct. ORBITS: Rightward gaze. Chronic postoperative changes to the left globe. SINUSES AND MASTOIDS: Paranasal sinuses, tympanic cavities and mastoids are well aerated. SOFT TISSUES AND SKULL: Chronic right side craniotomy. No acute skull fracture. No acute soft tissue abnormality. [place] Stroke Program Early CT Score (ASPECTS) Ganglionic (caudate, IC, lentiform nucleus, insula, M1-M3): 7 Supraganglionic (M4-M6): 3 Total: 10 IMPRESSION: 1. Stable advanced chronic ischemic disease and encephalomalacia.  ASPECTS 10. 2. These results were communicated to Dr Vanessa at 11:41 hours on 11/09/2024 by text page via the [facility] messaging system. Electronically signed by: Helayne Hurst MD 11/09/2024 11:42 AM EST RP Workstation: [id number]     LOS: 0 days   Donalda Applebaum, MD  [facility]    To contact the attending  provider between 7A-7P or the covering provider during after hours 7P-7A, please log into the web site [email or web address] and access using universal [place] password for that web site. If you do not have the password, please call the hospital operator.  11/10/2024, 9:19 AM

## 2024-11-10 NOTE — Progress Notes (Addendum)
 NEUROLOGY CONSULT FOLLOW UP NOTE   Date of service: November 10, 2024 Patient Name: Kirsten Mcmahon MRN:  [id number] DOB:  10/17/1946  Interval Hx/subjective   Seen in room, talking, answering questions. States she does not walk at baseline and can not move her left side at baseline. Pain and agitation with passive ROM on the left.   Vitals   Vitals:   11/09/24 2347 11/10/24 0331 11/10/24 0600 11/10/24 0800  BP: (!) 133/49 136/64  (!) 146/61  Pulse: 78 76  84  Resp: 20 20  16   Temp: 97.6 F (36.4 C) 97.8 F (36.6 C)  97.6 F (36.4 C)  TempSrc: Axillary Oral  Axillary  SpO2: 94% 98%    Weight:   66.8 kg      Body mass index is 25.28 kg/m.  Physical Exam   General: Laying comfortably in bed; in no acute distress. Appears chronically ill. HENT: Normal oropharynx and mucosa. Normal external appearance of ears and nose.  Neck: Supple, no pain or tenderness  CV: No JVD. No peripheral edema.  Pulmonary: Symmetric Chest rise. Normal respiratory effort.  Abdomen: Soft to touch, non-tender.  Ext: No cyanosis, edema, or deformity  Skin: No rash. Normal palpation of skin.   Musculoskeletal: Normal digits and nails by inspection. No clubbing.   Neurologic Examination  Mental status/Cognition: Alert, oriented to self only. Confused with poor attention. Biting on the bedsheet. Speech/language: dysarthric, follows simple 1 step commands. Cranial nerves:   CN II Pupils equal and reactive to light, no VF deficits   CN III,IV,VI EOM intact, no gaze preference or deviation, no nystagmus    CN V normal sensation in V1, V2, and V3 segments bilaterally    CN VII L facial drrop   CN VIII normal hearing to speech    CN IX & X normal palatal elevation, no uvular deviation    CN XI 5/5 head turn and 5/5 shoulder shrug bilaterally    CN XII midline tongue protrusion    Motor:  Muscle bulk: poor, tone contracture in LUE Has left hemiplegia at baseline. 5/5 grip strength and push and pull  with RUE.  Coordination/Complex Motor:  Intact in RUE  Medications  Current Facility-Administered Medications:     stroke: early stages of recovery book, , Does not apply, Once, Smith, Rondell A, MD   acetaminophen  (TYLENOL ) tablet 650 mg, 650 mg, Oral, Q6H PRN **OR** acetaminophen  (TYLENOL ) suppository 650 mg, 650 mg, Rectal, Q6H PRN, Smith, Rondell A, MD   albuterol  (PROVENTIL ) (2.5 MG/3ML) 0.083% nebulizer solution 2.5 mg, 2.5 mg, Nebulization, Q6H PRN, Claudene, Rondell A, MD   alteplase  (CATHFLO ACTIVASE ) injection 2 mg, 2 mg, Intracatheter, Once PRN, Kruska, Lindsay A, MD   anastrozole  (ARIMIDEX ) tablet 1 mg, 1 mg, Oral, Daily, Smith, Rondell A, MD, 1 mg at 11/10/24 9171   anticoagulant sodium citrate  solution 5 mL, 5 mL, Intracatheter, PRN, Kruska, Lindsay A, MD   [START ON 11/12/2024] calcitRIOL  (ROCALTROL ) capsule 0.5 mcg, 0.5 mcg, Oral, Q M,W,F-HD, Claudene, Rondell A, MD   Chlorhexidine  Gluconate Cloth 2 % PADS 6 each, 6 each, Topical, Q0600, Kruska, Lindsay A, MD, 6 each at 11/10/24 0608   heparin  injection 1,000 Units, 1,000 Units, Intracatheter, PRN, Kruska, Lindsay A, MD   heparin  injection 5,000 Units, 5,000 Units, Subcutaneous, Q8H, Smith, Rondell A, MD, 5,000 Units at 11/10/24 0606   lacosamide  (VIMPAT ) tablet 150 mg, 150 mg, Oral, BID, de Clint Kill, Cortney E, NP, 150 mg at 11/10/24 [phone]  lidocaine  (PF) (XYLOCAINE ) 1 % injection 5 mL, 5 mL, Intradermal, PRN, Kruska, Lindsay A, MD   lidocaine -prilocaine  (EMLA ) cream 1 Application, 1 Application, Topical, PRN, Kruska, Lindsay A, MD   LORazepam  (ATIVAN ) injection 1-2 mg, 1-2 mg, Intravenous, Q4H PRN, Claudene, Rondell A, MD   pentafluoroprop-tetrafluoroeth (GEBAUERS) aerosol 1 Application, 1 Application, Topical, PRN, Kruska, Lindsay A, MD   sodium chloride  flush (NS) 0.9 % injection 3 mL, 3 mL, Intravenous, Once, Simon Lavonia SAILOR, MD  Labs and Diagnostic Imaging   CBC:  Recent Labs  Lab 11/05/24 1655 11/09/24 1244 11/09/24 1303  11/10/24 0356  WBC 10.4 9.0  --  7.6  NEUTROABS 8.9* 7.7  --   --   HGB 12.2 11.6* 12.6 11.0*  HCT 39.2 38.0 37.0 35.5*  MCV 98.0 99.2  --  97.0  PLT 209 282  --  227    Basic Metabolic Panel:  Lab Results  Component Value Date   NA 142 11/10/2024   K 3.6 11/10/2024   CO2 23 11/10/2024   GLUCOSE 177 (H) 11/10/2024   BUN 61 (H) 11/10/2024   CREATININE 14.89 (H) 11/10/2024   CALCIUM  7.4 (L) 11/10/2024   GFRNONAA 2 (L) 11/10/2024   GFRAA 34 (L) 08/01/2018   Lipid Panel: No results found for: LDLCALC HgbA1c:  Lab Results  Component Value Date   HGBA1C 6.2 (H) 12/06/2023   Urine Drug Screen:     Component Value Date/Time   LABOPIA NONE DETECTED 02/07/2024 0231   COCAINSCRNUR NONE DETECTED 02/07/2024 0231   LABBENZ NONE DETECTED 02/07/2024 0231   AMPHETMU NONE DETECTED 02/07/2024 0231   THCU NONE DETECTED 02/07/2024 0231   LABBARB NONE DETECTED 02/07/2024 0231    Alcohol  Level     Component Value Date/Time   ETH <15 11/09/2024 1244   INR  Lab Results  Component Value Date   INR 1.1 11/09/2024   APTT  Lab Results  Component Value Date   APTT 27 11/09/2024   AED levels: No results found for: PHENYTOIN, ZONISAMIDE, LAMOTRIGINE, LEVETIRACETA  CT Head without contrast(Personally reviewed): No acute abnormality, stable chronic ischemic disease and encephalomalacia   MRI Brain(Personally reviewed): Pending   cEEG: 11/09/2024 1557 to 12/6/205 0730  ABNORMALITY -Spike,right centro-parietal region -Breach artifact, right centro-parietal region -Continuous slow, generalized    IMPRESSION: This study is consistent with patient's history of epilepsy arising from centro-parietal region. Additionally there is evidence of cortical dysfunction in right centro-parietal region consistent with prior craniotomy and underlying stroke. Lastly there is generalized cerebral dysfunction (encephalopathy).  No seizures were seen throughout the recording.  Assessment    Kirsten Mcmahon is a 78 y.o. female with hx of dementia, end-stage renal disease on dialysis, seizures, glaucoma, hypothyroidism, stroke with residual left-sided weakness, hypertension, hyperlipidemia, diabetes, and breast cancer status post right mastectomy presents with breakthrough seizure at home followed by aphasia, right gaze deviation and left hemiparesis.  Patient ran out of vimpat  about a week ago and skipped hemodialysis on Wednesday per son. LTM in place (will discontinue if negative), awaiting MRI. Nephrology following for HD management   Recommendations  - MRI Brain pending  - Discontinue LTM  - HD per nephrology - Resume home vimpat  dosing   - Lacosamide  150mg  BID  ______________________________________________________________________   Bonney Jorene Last, NP [facility]   NEUROHOSPITALIST ADDENDUM Performed a face to face diagnostic evaluation.   I have reviewed the contents of history and physical exam as documented by PA/ARNP/Resident and agree with above documentation.  I  have discussed and formulated the above plan as documented. Edits to the note have been made as needed.  Impression/Key exam findings/Plan: Breakthrough seizure likely secondary to running out of vimpat . Improved today, no further seizures. Will continue Vimpat  150mg  BID.  Will get MRI [place] w/o contrast and if negative, no further workup and okay for discharge. Hd per nephrology.  Wyatt Thorstenson, MD [facility] [phone]   If 7pm to 7am, please call on call as listed on AMION.

## 2024-11-10 NOTE — Progress Notes (Signed)
 Received patient in bed to unit.  Alert and oriented.  Informed consent signed and in chart.   TX duration: 3hours  Patient tolerated well.  Transported back to the room  Alert, without acute distress.  Hand-off given to patient's nurse.   Access used: Right fistula Upper arm Access issues: none  Total UF removed: 2L   11/10/24 1557  Vitals  Temp (!) 97 F (36.1 C)  Temp Source Axillary  BP (!) 168/69  Pulse Rate 93  Resp (!) 27  Weight 65.7 kg  Oxygen Therapy  SpO2 92 %  O2 Device Room Air  During Treatment Monitoring  Duration of HD Treatment -hour(s) 3 hour(s)  HD Safety Checks Performed Yes  Intra-Hemodialysis Comments Tx completed  Post Treatment  Dialyzer Clearance Heavily streaked  Liters Processed 63  Fluid Removed (mL) 2000 mL  Tolerated HD Treatment Yes  AVG/AVF Arterial Site Held (minutes) 7 minutes  AVG/AVF Venous Site Held (minutes) 7 minutes  Fistula / Graft Right Upper arm Arteriovenous vein graft  Placement Date/Time: 09/29/21 1023   Placed prior to admission: No  Orientation: Right  Access Location: Upper arm  Access Type: Arteriovenous vein graft  Site Condition No complications  Fistula / Graft Assessment Present;Bruit;Thrill  Status Deaccessed;Patent  Drainage Description None     Camellia Brasil LPN [facility]

## 2024-11-10 NOTE — Procedures (Addendum)
 Patient Name: Kirsten Mcmahon  MRN: [id number]  Epilepsy Attending: [name]  Referring Physician/Provider: [name], [name], MD  Duration: 11/09/2024 1557 to 12/6/205 1115  Patient history: 78yo F presented with L sied weakness and R gaze. Reported hx of twitching on the left, followed by seizure. EEG to evaluate for seizure  Level of alertness: Awake, asleep  AEDs during EEG study: LCM  Technical aspects: This EEG study was done with scalp electrodes positioned according to the 10-20 International system of electrode placement. Electrical activity was reviewed with band pass filter of 1-70Hz , sensitivity of 7 uV/mm, display speed of 35mm/sec with a 60Hz  notched filter applied as appropriate. EEG data were recorded continuously and digitally stored.  Video monitoring was available and reviewed as appropriate.  Description: No clear posterior dominant rhythm was seen. Sleep was characterized by sleep spindles (12 to 14 Hz), maximal frontocentral region.  EEG showed continuous generalized 3 to 6 Hz theta-delta slowing. There are also sharply contoured waves in the right centro-parietal region consistent with underlying breach artifact. Spikes were noted in right centro-parietal region. Hyperventilation and photic stimulation were not performed.      ABNORMALITY -Spike,right centro-parietal region -Breach artifact, right centro-parietal region -Continuous slow, generalized    IMPRESSION: This study is consistent with patient's history of epilepsy arising from centro-parietal region. Additionally there is evidence of cortical dysfunction in right centro-parietal region consistent with prior craniotomy and underlying stroke. Lastly there is generalized cerebral dysfunction (encephalopathy).  No seizures were seen throughout the recording.   Jacion Dismore O Hadasah Brugger

## 2024-11-10 NOTE — Progress Notes (Signed)
 Pt dialysis delayed by EEG which is schedule for this AM. Pt scheduled for dialysis post EEG.

## 2024-11-10 NOTE — Progress Notes (Signed)
 OT Cancellation Note  Patient Details Name: Kirsten Mcmahon MRN: [id number] DOB: [birth year]   Cancelled Treatment:    Reason Eval/Treat Not Completed: Patient at procedure or test/ unavailable (Pt off unit for HD. OT to reattempt to see pt for OT eval at a later time as appropriate/available.)  Margarie Rockey HERO., OTR/L, MA Acute Rehab [phone]   Margarie FORBES Horns 11/10/2024, 12:20 PM

## 2024-11-11 LAB — RENAL FUNCTION PANEL
Albumin: 2.8 g/dL — ABNORMAL LOW (ref 3.5–5.0)
Anion gap: 13 (ref 5–15)
BUN: 28 mg/dL — ABNORMAL HIGH (ref 8–23)
CO2: 28 mmol/L (ref 22–32)
Calcium: 7.8 mg/dL — ABNORMAL LOW (ref 8.9–10.3)
Chloride: 95 mmol/L — ABNORMAL LOW (ref 98–111)
Creatinine, Ser: 9.2 mg/dL — ABNORMAL HIGH (ref 0.44–1.00)
GFR, Estimated: 4 mL/min — ABNORMAL LOW (ref >60)
Glucose, Bld: 114 mg/dL — ABNORMAL HIGH (ref 70–99)
Phosphorus: 6.6 mg/dL — ABNORMAL HIGH (ref 2.5–4.6)
Potassium: 3.7 mmol/L (ref 3.5–5.1)
Sodium: 136 mmol/L (ref 135–145)

## 2024-11-11 MED ORDER — CHLORHEXIDINE GLUCONATE CLOTH 2 % EX PADS
6.0000 | MEDICATED_PAD | Freq: Every day | CUTANEOUS | Status: DC
  Administered 2024-11-11: 6 via TOPICAL

## 2024-11-11 MED ORDER — CALCITRIOL 0.25 MCG PO CAPS: 1.0000 ug | ORAL_CAPSULE | ORAL | Status: DC

## 2024-11-11 MED ORDER — LEVOTHYROXINE SODIUM 112 MCG PO TABS: 112.0000 ug | ORAL_TABLET | Freq: Every day | ORAL | 1 refills | Status: AC

## 2024-11-11 MED ORDER — DONEPEZIL HCL 10 MG PO TABS: 10.0000 mg | ORAL_TABLET | Freq: Every day | ORAL | 1 refills | Status: AC

## 2024-11-11 MED ORDER — LACOSAMIDE 150 MG PO TABS: 150.0000 mg | ORAL_TABLET | Freq: Two times a day (BID) | ORAL | 1 refills | Status: AC

## 2024-11-11 NOTE — Evaluation (Signed)
 Occupational Therapy Evaluation Patient Details Name: Kirsten Mcmahon MRN: [id number] DOB: 12-Jan-1946 Today's Date: 11/11/2024   History of Present Illness   78 y.o.  female presented 12/5 with breakthrough seizures. Withprior history of CVA with left-sided weakness, vascular dementia, ESRD on HD, seizure disorder.     Clinical Impressions Information from chart review: Pt is w/c bound at baseline and requires assistance with all ADLs. Pt currently requiring total A +2 for bed mobility and engagement in ADL tasks. Per most recent social work note, family requesting to bring Pt home and continue providing heavy 24/7 care vs. SNF. OT to recommend hoyer lift, HHOT, and level of assist as outlined below if family able to provide safe and adequate care. OT to continue to follow Pt acutely.      If plan is discharge home, recommend the following:   Two people to help with walking and/or transfers;Two people to help with bathing/dressing/bathroom;Assistance with cooking/housework;Assistance with feeding;Direct supervision/assist for financial management;Direct supervision/assist for medications management;Assist for transportation;Help with stairs or ramp for entrance;Supervision due to cognitive status     Functional Status Assessment   Patient has had a recent decline in their functional status and/or demonstrates limited ability to make significant improvements in function in a reasonable and predictable amount of time     Equipment Recommendations   Hoyer lift     Recommendations for Other Services         Precautions/Restrictions   Precautions Precautions: Fall;Other (comment) Recall of Precautions/Restrictions: Impaired Precaution/Restrictions Comments: hx of seizure Restrictions Weight Bearing Restrictions Per Provider Order: No     Mobility Bed Mobility Overal bed mobility: Needs Assistance Bed Mobility: Supine to Sit, Sit to Supine     Supine to sit: Total  assist, HOB elevated, +2 for physical assistance, +2 for safety/equipment Sit to supine: Total assist, +2 for physical assistance, +2 for safety/equipment   General bed mobility comments: Total A for bed mobility. Pt with decreased alertness this session. Resisting movement of BLE    Transfers                   General transfer comment: Unable to maintain sitting balance without Max A. Not able to safely attempt transfer OOB.      Balance Overall balance assessment: Needs assistance Sitting-balance support: Bilateral upper extremity supported Sitting balance-[name] Scale: Zero Sitting balance - Comments: Pt required Max A to sit EOB. Pt with strong R lateral lean. Resistant to come to midline Postural control: Right lateral lean                                 ADL either performed or assessed with clinical judgement   ADL Overall ADL's : Needs assistance/impaired Eating/Feeding: Total assistance   Grooming: Total assistance   Upper Body Bathing: Total assistance   Lower Body Bathing: Total assistance   Upper Body Dressing : Total assistance   Lower Body Dressing: Total assistance   Toilet Transfer: Total assistance   Toileting- Clothing Manipulation and Hygiene: Total assistance               Vision   Additional Comments: Unable to assess. Pt able to awaken with simulation and attend to staff for limited time     Perception         Praxis         Pertinent Vitals/Pain Pain Assessment Pain Assessment: PAINAD Breathing: normal Negative Vocalization: occasional  moan/groan, low speech, negative/disapproving quality Facial Expression: smiling or inexpressive Body Language: relaxed Consolability: no need to console PAINAD Score: 1 Pain Intervention(s): Monitored during session, Repositioned     Extremity/Trunk Assessment Upper Extremity Assessment Upper Extremity Assessment: Generalized weakness;LUE deficits/detail LUE Deficits /  Details: Residual L sided weakness from CVA. Increased tone in LUE   Lower Extremity Assessment Lower Extremity Assessment: Defer to PT evaluation   Cervical / Trunk Assessment Cervical / Trunk Assessment: Kyphotic   Communication Communication Communication: Impaired Factors Affecting Communication: Difficulty expressing self;Reduced clarity of speech   Cognition Arousal: Lethargic Behavior During Therapy: Flat affect Cognition: History of cognitive impairments             OT - Cognition Comments: Baseline dementia                 Following commands: Impaired Following commands impaired: Follows one step commands inconsistently     Cueing  General Comments   Cueing Techniques: Verbal cues;Gestural cues;Visual cues;Tactile cues  Decreased engagement this session as compared to previous notes. Per chart review, family requesting to bring Pt home and are able to care for her at current level. With support provided as outlined above, recommend HHOT.   Exercises     Shoulder Instructions      Home Living Family/patient expects to be discharged to:: Private residence Living Arrangements: Children Available Help at Discharge: Family Type of Home: House Home Access: Ramped entrance     Home Layout: One level     Bathroom Shower/Tub: Tub/shower unit         Home Equipment: BSC/3in1;Other (comment);Hospital bed;Tub bench;Grab bars - tub/shower;Hand held shower head;Grab bars - toilet;Cane - single point;Wheelchair - manual   Additional Comments: Information obtained per chart review. Pt unable to answer questions      Prior Functioning/Environment Prior Level of Function : Needs assist;Patient poor historian/Family not available             Mobility Comments: Per chart review, son lift pt into w/c and transports her to dialysis in w/c. States she does not ambulate. ADLs Comments: per chart review, Pt requires assistance with care. no further  information provided    OT Problem List: Decreased strength;Decreased range of motion;Decreased activity tolerance;Impaired balance (sitting and/or standing);Decreased cognition;Decreased safety awareness;Decreased knowledge of use of DME or AE;Impaired UE functional use   OT Treatment/Interventions: Self-care/ADL training;Therapeutic exercise;DME and/or AE instruction;Energy conservation;Neuromuscular education;Therapeutic activities;Patient/family education;Balance training      OT Goals(Current goals can be found in the care plan section)   Acute Rehab OT Goals Patient Stated Goal: none stated this session OT Goal Formulation: Patient unable to participate in goal setting Time For Goal Achievement: 11/25/24 Potential to Achieve Goals: Fair ADL Goals Pt Will Transfer to Toilet: with mod assist;bedside commode Additional ADL Goal #1: Pt will engage in bed mobility with Mod A as a precursor to engagement in ADL tasks. Additional ADL Goal #2: Pt will tolerate ADL task for at least 5 minutes while seated EOB with Mod A.   OT Frequency:  Min 2X/week    Co-evaluation              AM-PAC OT 6 Clicks Daily Activity     Outcome Measure Help from another person eating meals?: Total Help from another person taking care of personal grooming?: Total Help from another person toileting, which includes using toliet, bedpan, or urinal?: Total Help from another person bathing (including washing, rinsing, drying)?: Total Help from another person  to put on and taking off regular upper body clothing?: Total Help from another person to put on and taking off regular lower body clothing?: Total 6 Click Score: 6   End of Session    Activity Tolerance: Patient tolerated treatment well Patient left: in bed;with call bell/phone within reach  OT Visit Diagnosis: Other abnormalities of gait and mobility (R26.89);Muscle weakness (generalized) (M62.81);Hemiplegia and hemiparesis Hemiplegia -  Right/Left: Left                Time: 8840-8789 OT Time Calculation (min): 11 min Charges:  OT General Charges $OT Visit: 1 Visit OT Evaluation $OT Eval Low Complexity: 1 Low  Maurilio CROME, OTR/L.  [facility]  Office: [phone]   Maurilio PARAS Zandria Woldt 11/11/2024, 1:04 PM

## 2024-11-11 NOTE — Plan of Care (Signed)
  Problem: Ischemic Stroke/TIA Tissue Perfusion: Goal: Complications of ischemic stroke/TIA will be minimized Outcome: Progressing   Problem: Coping: Goal: Will identify appropriate support needs Outcome: Progressing   Problem: Self-Care: Goal: Ability to participate in self-care as condition permits will improve Outcome: Progressing

## 2024-11-11 NOTE — Progress Notes (Signed)
 [facility] Progress Note   Subjective:   Patient seen and examined at bedside.  Mentation improved a little.  Oriented to self and place, not time.  Denies CP and SOB.  Dialysis completed yesterday off schedule.  Tolerated well.   Objective Vitals:   11/10/24 2117 11/10/24 2355 11/11/24 0400 11/11/24 0800  BP: (!) 166/75 139/70 (!) 116/57 130/73  Pulse: 91 95 83 80  Resp: 20 20 19 20   Temp: (!) 97.3 F (36.3 C)   97.6 F (36.4 C)  TempSrc: Axillary   Axillary  SpO2: 97% 97% 97%   Weight:       Physical Exam General:chronically ill appearing female in NAD Heart:RRR, no mrg Lungs:CTAB anteriorly, nml WOB on RA Abdomen:soft, NTND Extremities:no LE edema Dialysis Access: RU AVG +b/t   Filed Weights   11/10/24 0600 11/10/24 1200 11/10/24 1557  Weight: 66.8 kg 66.5 kg 65.7 kg    Intake/Output Summary (Last 24 hours) at 11/11/2024 1008 Last data filed at 11/11/2024 0600 Gross per 24 hour  Intake 60 ml  Output 2000 ml  Net -1940 ml    Additional Objective Labs: Basic Metabolic Panel: Recent Labs  Lab 11/09/24 1244 11/09/24 1303 11/10/24 0356 11/11/24 0806  NA 145 143 142 136  K 4.3 4.1 3.6 3.7  CL 101 103 98 95*  CO2 24  --  23 28  GLUCOSE 134* 132* 177* 114*  BUN 57* 61* 61* 28*  CREATININE 14.37* 15.10* 14.89* 9.20*  CALCIUM  7.8*  --  7.4* 7.8*  PHOS  --   --  9.0* 6.6*   Liver Function Tests: Recent Labs  Lab 11/05/24 1655 11/09/24 1244 11/10/24 0356 11/11/24 0806  AST 17 18  --   --   ALT 13 11  --   --   ALKPHOS 80 75  --   --   BILITOT 0.6 0.9  --   --   PROT 7.2 7.3  --   --   ALBUMIN  3.1* 3.3* 2.9* 2.8*   CBC: Recent Labs  Lab 11/05/24 1655 11/09/24 1244 11/09/24 1303 11/10/24 0356  WBC 10.4 9.0  --  7.6  NEUTROABS 8.9* 7.7  --   --   HGB 12.2 11.6* 12.6 11.0*  HCT 39.2 38.0 37.0 35.5*  MCV 98.0 99.2  --  97.0  PLT 209 282  --  227   CBG: Recent Labs  Lab 11/09/24 1122  GLUCAP 117*    Studies/Results: MR  BRAIN WO CONTRAST Result Date: 11/10/2024 EXAM: MRI BRAIN WITHOUT CONTRAST 11/10/2024 08:22:11 PM TECHNIQUE: Multiplanar multisequence MRI of the head/brain was performed without the administration of intravenous contrast. COMPARISON: MRI 02/18/2024 CLINICAL HISTORY: Aphasia and left hemiparesis FINDINGS: BRAIN AND VENTRICLES: No acute infarct. No acute intracranial hemorrhage. No mass. No midline shift. No hydrocephalus. Old right MCA, right PCA and bilateral cerebellar infarcts. Cerebral atrophy. Many prior microhemorrhages, predominantly central. Normal flow voids. ORBITS: No acute abnormality. SINUSES AND MASTOIDS: No acute abnormality. BONES AND SOFT TISSUES: Prior right frontal cranioplasty. IMPRESSION: 1. No acute intracranial abnormality. 2. Remote right MCA, right PCA and bilateral cerebellar infarcts. 3. Many prior microhemorrhages, predominantly central and likely due to chronic hypertension. Electronically signed by: Gilmore Molt MD 11/10/2024 09:16 PM EST RP Workstation: [id number]   Overnight EEG with video Result Date: 11/10/2024 [name], MD     11/11/2024  8:30 AM Patient Name: KENDALYN CRANFIELD MRN: [id number] Epilepsy Attending: [name] Referring Physician/Provider: [name], [name], MD Duration: 11/09/2024 1557  to 12/6/205 1115 Patient history: 78yo F presented with L sied weakness and R gaze. Reported hx of twitching on the left, followed by seizure. EEG to evaluate for seizure Level of alertness: Awake, asleep AEDs during EEG study: LCM Technical aspects: This EEG study was done with scalp electrodes positioned according to the 10-20 International system of electrode placement. Electrical activity was reviewed with band pass filter of 1-70Hz , sensitivity of 7 uV/mm, display speed of 76mm/sec with a 60Hz  notched filter applied as appropriate. EEG data were recorded continuously and digitally stored.  Video monitoring was available and reviewed as appropriate. Description:  No clear posterior dominant rhythm was seen. Sleep was characterized by sleep spindles (12 to 14 Hz), maximal frontocentral region.  EEG showed continuous generalized 3 to 6 Hz theta-delta slowing. There are also sharply contoured waves in the right centro-parietal region consistent with underlying breach artifact. Spikes were noted in right centro-parietal region. Hyperventilation and photic stimulation were not performed.    ABNORMALITY -Spike,right centro-parietal region -Breach artifact, right centro-parietal region -Continuous slow, generalized  IMPRESSION: This study is consistent with patient's history of epilepsy arising from centro-parietal region. Additionally there is evidence of cortical dysfunction in right centro-parietal region consistent with prior craniotomy and underlying stroke. Lastly there is generalized cerebral dysfunction (encephalopathy).  No seizures were seen throughout the recording.  [name]  DG CHEST PORT 1 VIEW Result Date: 11/09/2024 CLINICAL DATA:  Seizure. EXAM: PORTABLE CHEST 1 VIEW COMPARISON:  05/18/2024 FINDINGS: Lung volumes are low. Prominent heart size, stable allowing for differences in technique. Aortic tortuosity. Subsegmental atelectasis at the left lung base. No pulmonary edema, pleural effusion, or pneumothorax. Surgical clips in the right chest wall and right thoracic inlet. IMPRESSION: Low lung volumes with subsegmental atelectasis at the left lung base. Electronically Signed   By: [name]   On: 11/09/2024 17:03   CT HEAD CODE STROKE WO CONTRAST Result Date: 11/09/2024 EXAM: CT HEAD WITHOUT 11/09/2024 11:34:32 AM TECHNIQUE: CT of the head was performed without the administration of intravenous contrast. Automated exposure control, iterative reconstruction, and/or weight based adjustment of the mA/kV was utilized to reduce the radiation dose to as low as reasonably achievable. COMPARISON: Brain MRI 02/18/2024. Head CT 02/06/2024. CLINICAL  HISTORY: 78 year old female  Neuro deficit, acute, stroke suspected. FINDINGS: BRAIN AND VENTRICLES: Advanced calcified atherosclerosis at the skull base. No acute intracranial hemorrhage. No mass effect or midline shift. No extra-axial fluid collection. No hydrocephalus. Advanced chronic encephalomalacia in the right hemisphere, confluent in the right MCA middle and anterior divisions, right PCA territory. Superimposed moderate chronic bilateral cerebellar infarcts with encephalomalacia. Superimposed advanced chronic bilateral cerebral white matter hypodensity. Superimposed Wallerian degeneration in the deep brain nuclei and the brainstem. No suspicious intracranial vascular hyperdensity. No evidence of acute infarct. ORBITS: Rightward gaze. Chronic postoperative changes to the left globe. SINUSES AND MASTOIDS: Paranasal sinuses, tympanic cavities and mastoids are well aerated. SOFT TISSUES AND SKULL: Chronic right side craniotomy. No acute skull fracture. No acute soft tissue abnormality. [place] Stroke Program Early CT Score (ASPECTS) Ganglionic (caudate, IC, lentiform nucleus, insula, M1-M3): 7 Supraganglionic (M4-M6): 3 Total: 10 IMPRESSION: 1. Stable advanced chronic ischemic disease and encephalomalacia.  ASPECTS 10. 2. These results were communicated to Dr Vanessa at 11:41 hours on 11/09/2024 by text page via the [facility] messaging system. Electronically signed by: Helayne Hurst MD 11/09/2024 11:42 AM EST RP Workstation: [id number]    Medications:   anastrozole   1 mg Oral Daily   aspirin   EC  81 mg Oral Daily   [START ON 11/12/2024] calcitRIOL   0.5 mcg Oral Q M,W,F-HD   Chlorhexidine  Gluconate Cloth  6 each Topical Q0600   donepezil   10 mg Oral QHS   heparin   5,000 Units Subcutaneous Q8H   lacosamide   150 mg Oral BID   levothyroxine   112 mcg Oral QAC breakfast   sodium chloride  flush  3 mL Intravenous Once    Dialysis Orders: MWF [facility]  3.75hrs, 350/AF 1.5 62.4kg 2K 2.5Ca AVG Heparin  2000  units qHD Calcitriol  1mcg qHD   Assessment/Plan: 1. Seizures - while off meds. Neuro consulting.  EEG consistent w/Hx epilepsy, craniotomy, stroke and encephalopathy  No seizures seen throughout recording. CT head with no acute findings, showed stable advanced chronic ischemic disease and encephalomalacia. Per Neuro/PMD. 2. ESRD - on HD MWF. HD completed yesterday off schedule. No complications reported. Outpatient records show recent issues with agitation/restlessness during HD.  Center has requested for patient to have sitter for treatments moving forward as outpatient d/t safety concerns. Plan for HD tomorrow per regular schedule. 3. Anemia of CKD- Hgb 11. No indication for ESA.  4. Secondary hyperparathyroidism - Ca low, phos improving.  Usually in goal.  If remains elevated will add binders. Not on binders a outpatient.  5. HTN/volume - BP improved post HD.  Continue home meds.  Does not appear volume overloaded.  UF as tolerated.   6. Nutrition - Renal diet w/fluid restrictions when eating.   Manuelita Labella, PA-C [facility] 11/11/2024,10:08 AM  LOS: 0 days

## 2024-11-11 NOTE — Progress Notes (Signed)
 NEUROLOGY CONSULT FOLLOW UP NOTE   Date of service: November 11, 2024 Patient Name: Kirsten Mcmahon MRN:  [id number] DOB:  26-May-1946  Interval Hx/subjective   Seen in room, answering questions appropriately. HD done yesterday. MRI Done  Vitals   Vitals:   11/10/24 1647 11/10/24 2117 11/10/24 2355 11/11/24 0400  BP: 114/72 (!) 166/75 139/70 (!) 116/57  Pulse: 93 91 95 83  Resp: (!) 21 20 20 19   Temp: 98.7 F (37.1 C) (!) 97.3 F (36.3 C)    TempSrc: Axillary Axillary    SpO2: 97% 97% 97% 97%  Weight:         Body mass index is 24.86 kg/m.  Physical Exam   General: Laying comfortably in bed; in no acute distress. Appears chronically ill. HENT: Normal oropharynx and mucosa. Normal external appearance of ears and nose.  Neck: Supple, no pain or tenderness  CV: No JVD. No peripheral edema.  Pulmonary: Symmetric Chest rise. Normal respiratory effort.  Abdomen: Soft to touch, non-tender.  Ext: No cyanosis, edema, or deformity  Skin: No rash. Normal palpation of skin.   Musculoskeletal: Normal digits and nails by inspection. No clubbing.   Neurologic Examination  Mental status/Cognition: Alert, oriented to self only. Confused with poor attention. Biting on the bedsheet. Speech/language: dysarthric, follows simple 1 step commands. Cranial nerves:   CN II Pupils equal and reactive to light, no VF deficits   CN III,IV,VI EOM intact, no gaze preference or deviation, no nystagmus    CN V normal sensation in V1, V2, and V3 segments bilaterally    CN VII L facial drrop   CN VIII normal hearing to speech    CN IX & X normal palatal elevation, no uvular deviation    CN XI 5/5 head turn and 5/5 shoulder shrug bilaterally    CN XII midline tongue protrusion    Motor:  Muscle bulk: poor, tone contracture in LUE Has left hemiplegia at baseline. 5/5 grip strength and push and pull with RUE.  Coordination/Complex Motor:  Intact in RUE  Medications  Current  Facility-Administered Medications:    acetaminophen  (TYLENOL ) tablet 650 mg, 650 mg, Oral, Q6H PRN **OR** acetaminophen  (TYLENOL ) suppository 650 mg, 650 mg, Rectal, Q6H PRN, Smith, Rondell A, MD   albuterol  (PROVENTIL ) (2.5 MG/3ML) 0.083% nebulizer solution 2.5 mg, 2.5 mg, Nebulization, Q6H PRN, Claudene, Rondell A, MD   anastrozole  (ARIMIDEX ) tablet 1 mg, 1 mg, Oral, Daily, Smith, Rondell A, MD, 1 mg at 11/10/24 9171   aspirin  EC tablet 81 mg, 81 mg, Oral, Daily, Ghimire, Shanker M, MD, 81 mg at 11/10/24 1646   [START ON 11/12/2024] calcitRIOL  (ROCALTROL ) capsule 0.5 mcg, 0.5 mcg, Oral, Q M,W,F-HD, Claudene, Rondell A, MD   Chlorhexidine  Gluconate Cloth 2 % PADS 6 each, 6 each, Topical, Q0600, Kruska, Lindsay A, MD, 6 each at 11/10/24 0608   donepezil  (ARICEPT ) tablet 10 mg, 10 mg, Oral, QHS, Ghimire, Shanker M, MD, 10 mg at 11/10/24 2110   heparin  injection 5,000 Units, 5,000 Units, Subcutaneous, Q8H, Smith, Rondell A, MD, 5,000 Units at 11/11/24 0502   hydrALAZINE  (APRESOLINE ) injection 10 mg, 10 mg, Intravenous, Q6H PRN, Ghimire, Donalda HERO, MD   lacosamide  (VIMPAT ) tablet 150 mg, 150 mg, Oral, BID, de Clint Kill, Cortney E, NP, 150 mg at 11/10/24 2110   levothyroxine  (SYNTHROID ) tablet 112 mcg, 112 mcg, Oral, QAC breakfast, Ghimire, Donalda HERO, MD, 112 mcg at 11/11/24 0503   LORazepam  (ATIVAN ) injection 1-2 mg, 1-2 mg, Intravenous, Q4H PRN, Claudene,  Rondell A, MD   ondansetron  (ZOFRAN ) injection 4 mg, 4 mg, Intravenous, Q6H PRN, Raenelle Donalda HERO, MD, 4 mg at 11/10/24 1003   sodium chloride  flush (NS) 0.9 % injection 3 mL, 3 mL, Intravenous, Once, Simon Lavonia SAILOR, MD  Labs and Diagnostic Imaging   CBC:  Recent Labs  Lab 11/05/24 1655 11/09/24 1244 11/09/24 1303 11/10/24 0356  WBC 10.4 9.0  --  7.6  NEUTROABS 8.9* 7.7  --   --   HGB 12.2 11.6* 12.6 11.0*  HCT 39.2 38.0 37.0 35.5*  MCV 98.0 99.2  --  97.0  PLT 209 282  --  227    Basic Metabolic Panel:  Lab Results  Component Value Date    NA 142 11/10/2024   K 3.6 11/10/2024   CO2 23 11/10/2024   GLUCOSE 177 (H) 11/10/2024   BUN 61 (H) 11/10/2024   CREATININE 14.89 (H) 11/10/2024   CALCIUM  7.4 (L) 11/10/2024   GFRNONAA 2 (L) 11/10/2024   GFRAA 34 (L) 08/01/2018   Lipid Panel: No results found for: LDLCALC HgbA1c:  Lab Results  Component Value Date   HGBA1C 6.2 (H) 12/06/2023   Urine Drug Screen:     Component Value Date/Time   LABOPIA NONE DETECTED 02/07/2024 0231   COCAINSCRNUR NONE DETECTED 02/07/2024 0231   LABBENZ NONE DETECTED 02/07/2024 0231   AMPHETMU NONE DETECTED 02/07/2024 0231   THCU NONE DETECTED 02/07/2024 0231   LABBARB NONE DETECTED 02/07/2024 0231    Alcohol  Level     Component Value Date/Time   ETH <15 11/09/2024 1244   INR  Lab Results  Component Value Date   INR 1.1 11/09/2024   APTT  Lab Results  Component Value Date   APTT 27 11/09/2024   AED levels: No results found for: PHENYTOIN, ZONISAMIDE, LAMOTRIGINE, LEVETIRACETA  CT Head without contrast(Personally reviewed): No acute abnormality, stable chronic ischemic disease and encephalomalacia   MRI Brain(Personally reviewed): 1. No acute intracranial abnormality. 2. Remote right MCA, right PCA and bilateral cerebellar infarcts. 3. Many prior microhemorrhages, predominantly central and likely due to chronic hypertension.  cEEG: 11/09/2024 1557 to 12/6/205 0730  ABNORMALITY -Spike,right centro-parietal region -Breach artifact, right centro-parietal region -Continuous slow, generalized    IMPRESSION: This study is consistent with patient's history of epilepsy arising from centro-parietal region. Additionally there is evidence of cortical dysfunction in right centro-parietal region consistent with prior craniotomy and underlying stroke. Lastly there is generalized cerebral dysfunction (encephalopathy).  No seizures were seen throughout the recording.  Assessment   Kirsten Mcmahon is a 78 y.o. female with hx of  dementia, end-stage renal disease on dialysis, seizures, glaucoma, hypothyroidism, stroke with residual left-sided weakness, hypertension, hyperlipidemia, diabetes, and breast cancer status post right mastectomy presents with breakthrough seizure at home followed by aphasia, right gaze deviation and left hemiparesis.  Patient ran out of vimpat  about a week ago and skipped hemodialysis on Wednesday per son. Nephrology following for HD management.  Patient is back to her baseline after resuming Vimpat .  Recommendations   - HD per nephrology - Resume home vimpat  dosing   - Lacosamide  150mg  BID  - neurology will signoff. ______________________________________________________________________  Neurology will remain available as needed  Signed, Jorene Last, NP [facility]  NEUROHOSPITALIST ADDENDUM Performed a face to face diagnostic evaluation.   I have reviewed the contents of history and physical exam as documented by PA/ARNP/Resident and agree with above documentation.  I have discussed and formulated the above plan as documented. Edits to the  note have been made as needed.  Fitzhugh Vizcarrondo, MD [facility] [phone]   If 7pm to 7am, please call on call as listed on AMION.

## 2024-11-11 NOTE — Discharge Summary (Signed)
 PATIENT DETAILS Name: Kirsten Mcmahon Age: 78 y.o. Sex: female Date of Birth: 1946-03-10 MRN: [id number]. Admitting Physician: Maximino DELENA Sharps, MD ERE:[facility]  Admit Date: 11/09/2024 Discharge date: 11/11/2024  Recommendations for Outpatient Follow-up:  Follow up with PCP in 1-2 weeks Please obtain CMP/CBC in one week  Admitted From:  Home  Disposition: Home health   Discharge Condition: fair  CODE STATUS:   Code Status: Full Code   Diet recommendation:  Diet Order             Diet - low sodium heart healthy           Diet renal with fluid restriction Fluid restriction: 1200 mL Fluid; Room service appropriate? Yes; Fluid consistency: Thin  Diet effective now                    Brief Summary: Patient is a 78 y.o.  female with prior history of CVA with left-sided weakness, vascular dementia, ESRD on HD, seizure disorder who presented with breakthrough seizures.   Significant events: 12/5>> admit to [facility]   Significant studies: 12/5>> CT head: No acute abnormality 12/5>> CXR: No PNA 12/6>> MRI brain: No acute intracranial abnormalities   Significant microbiology data: None   Procedures: 12/5-12/6>> evidence of epileptogenicity-central parietal region-but no obvious seizures.   Consults: Neurology Nephrology  Brief Hospital Course: Breakthrough seizure Likely secondary to medication noncompliance-ran out of Vimpat  prior 2 weeks prior to this hospitalization No obvious seizures overnight Loaded with Vimpat  on admission-currently on maintenance dose of Vimpat  LTM EEG overnight negative for seizures MRI brain negative for any significant abnormalities No further recommendations from neurology-to continue with maintenance dose of Vimpat  stable for discharge-Ensure outpatient follow-up with neurology   ESRD on HD [facility] Missed HD a day prior to this hospitalization Nephrology followed closely-and directed HD care Resume usual  outpatient follow-up with nephrology   Recent history of acute diverticulitis Was on Augmentin  prior to this hospitalization-I suspect this can now be discontinued Abdominal exam is benign.   Normocytic anemia Mild Likely secondary to ESRD Defer Aranesp /iron therapies to nephrology service   History of CVA with left-sided hemiparesis On ASA   Hypothyroidism TSH stable Resume prior dosing of levothyroxine    History of breast cancer Anastrozole  Outpatient follow-up with oncology   Vascular dementia Pleasantly confused Delirium precautions. Aricept      Discharge Diagnoses:  Principal Problem:   Seizures (HCC) Active Problems:   End-stage renal disease on hemodialysis (HCC)   Diverticulitis   Vascular dementia (HCC)   Anemia of chronic disease   History of breast cancer   Hypothyroidism   Discharge Instructions:  Activity:  As tolerated with Full fall precautions use walker/cane & assistance as needed   Discharge Instructions     Diet - low sodium heart healthy   Complete by: As directed    Discharge instructions   Complete by: As directed    Follow with Primary MD  [facility] in 1-2 weeks  Please get a complete blood count and chemistry panel checked by your Primary MD at your next visit, and again as instructed by your Primary MD.  Get Medicines reviewed and adjusted: Please take all your medications with you for your next visit with your Primary MD  Laboratory/radiological data: Please request your Primary MD to go over all hospital tests and procedure/radiological results at the follow up, please ask your Primary MD to get all Hospital records sent to his/her office.  In some cases, they will be blood work, cultures and biopsy results pending at the time of your discharge. Please request that your primary care M.D. follows up on these results.  Also Note the following: If you experience worsening of your admission symptoms, develop  shortness of breath, life threatening emergency, suicidal or homicidal thoughts you must seek medical attention immediately by calling 911 or calling your MD immediately  if symptoms less severe.  You must read complete instructions/literature along with all the possible adverse reactions/side effects for all the Medicines you take and that have been prescribed to you. Take any new Medicines after you have completely understood and accpet all the possible adverse reactions/side effects.   Do not drive when taking Pain medications or sleeping medications (Benzodaizepines)  Do not take more than prescribed Pain, Sleep and Anxiety Medications. It is not advisable to combine anxiety,sleep and pain medications without talking with your primary care practitioner  Special Instructions: If you have smoked or chewed Tobacco  in the last 2 yrs please stop smoking, stop any regular Alcohol   and or any Recreational drug use.  Wear Seat belts while driving.  Please note: You were cared for by a hospitalist during your hospital stay. Once you are discharged, your primary care physician will handle any further medical issues. Please note that NO REFILLS for any discharge medications will be authorized once you are discharged, as it is imperative that you return to your primary care physician (or establish a relationship with a primary care physician if you do not have one) for your post hospital discharge needs so that they can reassess your need for medications and monitor your lab values.     Seizure precautions: Per [place]  DMV statutes, patients with seizures are not allowed to drive until they have been seizure-free for six months and cleared by a physician    Use caution when using heavy equipment or power tools. Avoid working on ladders or at heights. Take showers instead of baths. Ensure the water  temperature is not too high on the home water  heater. Do not go swimming alone. Do not lock yourself  in a room alone (i.e. bathroom). When caring for infants or small children, sit down when holding, feeding, or changing them to minimize risk of injury to the child in the event you have a seizure. Maintain good sleep hygiene. Avoid alcohol .    If patient has another seizure, call 911 and bring them back to the ED if: A.  The seizure lasts longer than 5 minutes.      B.  The patient doesn't wake shortly after the seizure or has new problems such as difficulty seeing, speaking or moving following the seizure C.  The patient was injured during the seizure D.  The patient has a temperature over 102 F (39C) E.  The patient vomited during the seizure and now is having trouble breathing    During the Seizure   - First, ensure adequate ventilation and place patients on the floor on their left side  Loosen clothing around the neck and ensure the airway is patent. If the patient is clenching the teeth, do not force the mouth open with any object as this can cause severe damage - Remove all items from the surrounding that can be hazardous. The patient may be oblivious to what's happening and may not even know what he or she is doing. If the patient is confused and wandering, either gently guide him/her away and block  access to outside areas - Reassure the individual and be comforting - Call 911. In most cases, the seizure ends before EMS arrives. However, there are cases when seizures may last over 3 to 5 minutes. Or the individual may have developed breathing difficulties or severe injuries. If a pregnant patient or a person with diabetes develops a seizure, it is prudent to call an ambulance. - Finally, if the patient does not regain full consciousness, then call EMS. Most patients will remain confused for about 45 to 90 minutes after a seizure, so you must use judgment in calling for help. - Avoid restraints but make sure the patient is in a bed with padded side rails - Place the individual in a lateral  position with the neck slightly flexed; this will help the saliva drain from the mouth and prevent the tongue from falling backward - Remove all nearby furniture and other hazards from the area - Provide verbal assurance as the individual is regaining consciousness - Provide the patient with privacy if possible - Call for help and start treatment as ordered by the caregiver    After the Seizure (Postictal Stage)   After a seizure, most patients experience confusion, fatigue, muscle pain and/or a headache. Thus, one should permit the individual to sleep. For the next few days, reassurance is essential. Being calm and helping reorient the person is also of importance.   Most seizures are painless and end spontaneously. Seizures are not harmful to others but can lead to complications such as stress on the lungs, brain and the heart. Individuals with prior lung problems may develop labored breathing and respiratory distress.    Increase activity slowly   Complete by: As directed       Allergies as of 11/11/2024       Reactions   Aricept  [donepezil ] Nausea And Vomiting   Contrast Media [iodinated Contrast Media] Swelling   Latex Itching   Shellfish Allergy Other (See Comments)   Unknown reaction   Levemir [insulin  Detemir] Itching        Medication List     STOP taking these medications    amoxicillin -clavulanate 250-125 MG tablet Commonly known as: AUGMENTIN    docusate sodium  100 MG capsule Commonly known as: Colace   FiberCon 625 MG tablet Generic drug: polycarbophil   heparin  sodium (porcine) 1000 UNIT/ML injection   phenytoin 100 MG ER capsule Commonly known as: DILANTIN   VENOFER IV       TAKE these medications    acetaminophen  325 MG tablet Commonly known as: TYLENOL  Take 650 mg by mouth every 6 (six) hours as needed for headache or fever (pain).   anastrozole  1 MG tablet Commonly known as: ARIMIDEX  Take 1 tablet (1 mg total) by mouth daily.   aspirin  EC  81 MG tablet Take 81 mg by mouth daily.   CALCITRIOL  PO Take 1 mcg by mouth every Monday, Wednesday, and Friday with hemodialysis. Medication provided and administered by [facility] three times per week - Monday, Wednesday and Friday during dialysis.   Dialyvite 800-Zinc 15 0.8 MG Tabs Take 1 tablet by mouth daily.   donepezil  10 MG tablet Commonly known as: ARICEPT  Take 1 tablet (10 mg total) by mouth at bedtime.   Lacosamide  150 MG Tabs Take 1 tablet (150 mg total) by mouth 2 (two) times daily.   latanoprost  0.005 % ophthalmic solution Commonly known as: XALATAN  Place 1 drop into both eyes at bedtime.   levothyroxine  112 MCG tablet Commonly known as:  SYNTHROID  Take 1 tablet (112 mcg total) by mouth daily before breakfast.   Velphoro  500 MG chewable tablet Generic drug: sucroferric oxyhydroxide Chew 500 mg by mouth 3 (three) times daily with meals.        Contact information for after-discharge care     Home Medical Care     [facility]) .   Service: Home Health Services Contact information: [address] [place]  [phone]                    Allergies  Allergen Reactions   Aricept  [Donepezil ] Nausea And Vomiting   Contrast Media [Iodinated Contrast Media] Swelling   Latex Itching   Shellfish Allergy Other (See Comments)    Unknown reaction   Levemir [Insulin  Detemir] Itching     Other Procedures/Studies: MR BRAIN WO CONTRAST Result Date: 11/10/2024 EXAM: MRI BRAIN WITHOUT CONTRAST 11/10/2024 08:22:11 PM TECHNIQUE: Multiplanar multisequence MRI of the head/brain was performed without the administration of intravenous contrast. COMPARISON: MRI 02/18/2024 CLINICAL HISTORY: Aphasia and left hemiparesis FINDINGS: BRAIN AND VENTRICLES: No acute infarct. No acute intracranial hemorrhage. No mass. No midline shift. No hydrocephalus. Old right MCA, right PCA and bilateral cerebellar infarcts. Cerebral  atrophy. Many prior microhemorrhages, predominantly central. Normal flow voids. ORBITS: No acute abnormality. SINUSES AND MASTOIDS: No acute abnormality. BONES AND SOFT TISSUES: Prior right frontal cranioplasty. IMPRESSION: 1. No acute intracranial abnormality. 2. Remote right MCA, right PCA and bilateral cerebellar infarcts. 3. Many prior microhemorrhages, predominantly central and likely due to chronic hypertension. Electronically signed by: Gilmore Molt MD 11/10/2024 09:16 PM EST RP Workstation: [id number]   Overnight EEG with video Result Date: 11/10/2024 [name], MD     11/11/2024  8:30 AM Patient Name: Kirsten Mcmahon MRN: [id number] Epilepsy Attending: [name] Referring Physician/Provider: [name], [name], MD Duration: 11/09/2024 1557 to 12/6/205 1115 Patient history: 78yo F presented with L sied weakness and R gaze. Reported hx of twitching on the left, followed by seizure. EEG to evaluate for seizure Level of alertness: Awake, asleep AEDs during EEG study: LCM Technical aspects: This EEG study was done with scalp electrodes positioned according to the 10-20 International system of electrode placement. Electrical activity was reviewed with band pass filter of 1-70Hz , sensitivity of 7 uV/mm, display speed of 93mm/sec with a 60Hz  notched filter applied as appropriate. EEG data were recorded continuously and digitally stored.  Video monitoring was available and reviewed as appropriate. Description: No clear posterior dominant rhythm was seen. Sleep was characterized by sleep spindles (12 to 14 Hz), maximal frontocentral region.  EEG showed continuous generalized 3 to 6 Hz theta-delta slowing. There are also sharply contoured waves in the right centro-parietal region consistent with underlying breach artifact. Spikes were noted in right centro-parietal region. Hyperventilation and photic stimulation were not performed.    ABNORMALITY -Spike,right centro-parietal region -Breach  artifact, right centro-parietal region -Continuous slow, generalized  IMPRESSION: This study is consistent with patient's history of epilepsy arising from centro-parietal region. Additionally there is evidence of cortical dysfunction in right centro-parietal region consistent with prior craniotomy and underlying stroke. Lastly there is generalized cerebral dysfunction (encephalopathy).  No seizures were seen throughout the recording.  [name]  DG CHEST PORT 1 VIEW Result Date: 11/09/2024 CLINICAL DATA:  Seizure. EXAM: PORTABLE CHEST 1 VIEW COMPARISON:  05/18/2024 FINDINGS: Lung volumes are low. Prominent heart size, stable allowing for differences in technique. Aortic tortuosity. Subsegmental atelectasis at the left lung  base. No pulmonary edema, pleural effusion, or pneumothorax. Surgical clips in the right chest wall and right thoracic inlet. IMPRESSION: Low lung volumes with subsegmental atelectasis at the left lung base. Electronically Signed   By: [name]   On: 11/09/2024 17:03   CT HEAD CODE STROKE WO CONTRAST Result Date: 11/09/2024 EXAM: CT HEAD WITHOUT 11/09/2024 11:34:32 AM TECHNIQUE: CT of the head was performed without the administration of intravenous contrast. Automated exposure control, iterative reconstruction, and/or weight based adjustment of the mA/kV was utilized to reduce the radiation dose to as low as reasonably achievable. COMPARISON: Brain MRI 02/18/2024. Head CT 02/06/2024. CLINICAL HISTORY: 78 year old female  Neuro deficit, acute, stroke suspected. FINDINGS: BRAIN AND VENTRICLES: Advanced calcified atherosclerosis at the skull base. No acute intracranial hemorrhage. No mass effect or midline shift. No extra-axial fluid collection. No hydrocephalus. Advanced chronic encephalomalacia in the right hemisphere, confluent in the right MCA middle and anterior divisions, right PCA territory. Superimposed moderate chronic bilateral cerebellar infarcts with  encephalomalacia. Superimposed advanced chronic bilateral cerebral white matter hypodensity. Superimposed Wallerian degeneration in the deep brain nuclei and the brainstem. No suspicious intracranial vascular hyperdensity. No evidence of acute infarct. ORBITS: Rightward gaze. Chronic postoperative changes to the left globe. SINUSES AND MASTOIDS: Paranasal sinuses, tympanic cavities and mastoids are well aerated. SOFT TISSUES AND SKULL: Chronic right side craniotomy. No acute skull fracture. No acute soft tissue abnormality. Kirsten Stroke Program Early CT Score (ASPECTS) Ganglionic (caudate, IC, lentiform nucleus, insula, M1-M3): 7 Supraganglionic (M4-M6): 3 Total: 10 IMPRESSION: 1. Stable advanced chronic ischemic disease and encephalomalacia.  ASPECTS 10. 2. These results were communicated to Dr Vanessa at 11:41 hours on 11/09/2024 by text page via the [facility] messaging system. Electronically signed by: Helayne Hurst MD 11/09/2024 11:42 AM EST RP Workstation: [id number]   CT ABDOMEN PELVIS WO CONTRAST Result Date: 11/05/2024 CLINICAL DATA:  Acute abdominal pain. EXAM: CT ABDOMEN AND PELVIS WITHOUT CONTRAST TECHNIQUE: Multidetector CT imaging of the abdomen and pelvis was performed following the standard protocol without IV contrast. RADIATION DOSE REDUCTION: This exam was performed according to the departmental dose-optimization program which includes automated exposure control, adjustment of the mA and/or kV according to patient size and/or use of iterative reconstruction technique. COMPARISON:  CT 08/19/2023 FINDINGS: Lower chest: The heart is enlarged. Atelectasis in the lower lobes. No pleural effusion. Skin thickening and edema within the left breast/chest wall, also seen on prior exam. Hepatobiliary: Punctate calcified granuloma. No evidence of focal liver lesion on this unenhanced exam. Gallstones within decompressed gallbladder. No pericholecystic inflammation. No biliary dilatation. Pancreas:  Low-density lesion in the pancreatic tail measures 13 mm, unchanged from prior exam. No ductal dilatation or inflammation. Spleen: Normal in size without focal abnormality. Adrenals/Urinary Tract: Again seen bilateral adrenal thickening, stable. Chronic bilateral renal parenchymal atrophy. No hydronephrosis. Punctate left renal stones, nonobstructing. Bilateral renal vascular calcifications. Decompressed urinary bladder. Stomach/Bowel: Small hiatal hernia. Gastric distension with air and ingested contents. No gastric wall thickening. No small bowel distension or obstruction. Normal appendix. Moderate volume of stool throughout the colon. Scattered colonic diverticulosis. Question early inflammation about the descending colonic diverticula, coronal series 6, image 51. Vascular/Lymphatic: Aortic atherosclerosis. No aneurysm. No portal venous gas. No suspicious adenopathy. Reproductive: Status post hysterectomy. No adnexal masses. Other: No ascites. No free air or focal fluid collection. Mild generalized body wall edema. Musculoskeletal: The bones are subjectively under mineralized. Degenerative change throughout the spine and both hips. There are no acute or suspicious osseous abnormalities. IMPRESSION: 1.  Colonic diverticulosis with question of early inflammation about the descending colonic diverticula, possible early diverticulitis. 2. Cholelithiasis without CT findings of acute cholecystitis. 3. Chronic bilateral renal parenchymal atrophy. Nonobstructing left renal stones. 4. Stable low-density lesion in the pancreatic tail measuring 13 mm. Attention at follow-up recommended. 5. Generalized body wall edema, including edema within the left breast. May be related to chronic renal disease or volume overload. Aortic Atherosclerosis (ICD10-I70.0). Electronically Signed   By: [name]   On: 11/05/2024 18:10     TODAY-DAY OF DISCHARGE:  Subjective:   Jama Dustman today has no headache,no chest  abdominal pain,no new weakness tingling or numbness, feels much better wants to go home today.   Objective:   Blood pressure 130/73, pulse 79, temperature 97.6 F (36.4 C), temperature source Axillary, resp. rate 18, weight 65.7 kg, SpO2 99%.  Intake/Output Summary (Last 24 hours) at 11/11/2024 1240 Last data filed at 11/11/2024 0600 Gross per 24 hour  Intake 60 ml  Output 2000 ml  Net -1940 ml   Filed Weights   11/10/24 0600 11/10/24 1200 11/10/24 1557  Weight: 66.8 kg 66.5 kg 65.7 kg    Exam: Awake Alert, Oriented *3, No new F.N deficits, Normal affect [place].AT,PERRAL Supple Neck,No JVD, No cervical lymphadenopathy appriciated.  Symmetrical Chest wall movement, Good air movement bilaterally, CTAB RRR,No Gallops,Rubs or new Murmurs, No Parasternal Heave +ve B.Sounds, Abd Soft, Non tender, No organomegaly appriciated, No rebound -guarding or rigidity. No Cyanosis, Clubbing or edema, No new Rash or bruise   PERTINENT RADIOLOGIC STUDIES: MR BRAIN WO CONTRAST Result Date: 11/10/2024 EXAM: MRI BRAIN WITHOUT CONTRAST 11/10/2024 08:22:11 PM TECHNIQUE: Multiplanar multisequence MRI of the head/brain was performed without the administration of intravenous contrast. COMPARISON: MRI 02/18/2024 CLINICAL HISTORY: Aphasia and left hemiparesis FINDINGS: BRAIN AND VENTRICLES: No acute infarct. No acute intracranial hemorrhage. No mass. No midline shift. No hydrocephalus. Old right MCA, right PCA and bilateral cerebellar infarcts. Cerebral atrophy. Many prior microhemorrhages, predominantly central. Normal flow voids. ORBITS: No acute abnormality. SINUSES AND MASTOIDS: No acute abnormality. BONES AND SOFT TISSUES: Prior right frontal cranioplasty. IMPRESSION: 1. No acute intracranial abnormality. 2. Remote right MCA, right PCA and bilateral cerebellar infarcts. 3. Many prior microhemorrhages, predominantly central and likely due to chronic hypertension. Electronically signed by: Gilmore Molt MD  11/10/2024 09:16 PM EST RP Workstation: [id number]   Overnight EEG with video Result Date: 11/10/2024 [name], MD     11/11/2024  8:30 AM Patient Name: Kirsten Mcmahon MRN: [id number] Epilepsy Attending: [name] Referring Physician/Provider: [name], [name], MD Duration: 11/09/2024 1557 to 12/6/205 1115 Patient history: 78yo F presented with L sied weakness and R gaze. Reported hx of twitching on the left, followed by seizure. EEG to evaluate for seizure Level of alertness: Awake, asleep AEDs during EEG study: LCM Technical aspects: This EEG study was done with scalp electrodes positioned according to the 10-20 International system of electrode placement. Electrical activity was reviewed with band pass filter of 1-70Hz , sensitivity of 7 uV/mm, display speed of 57mm/sec with a 60Hz  notched filter applied as appropriate. EEG data were recorded continuously and digitally stored.  Video monitoring was available and reviewed as appropriate. Description: No clear posterior dominant rhythm was seen. Sleep was characterized by sleep spindles (12 to 14 Hz), maximal frontocentral region.  EEG showed continuous generalized 3 to 6 Hz theta-delta slowing. There are also sharply contoured waves in the right centro-parietal region consistent with underlying breach artifact. Spikes were noted in right centro-parietal  region. Hyperventilation and photic stimulation were not performed.    ABNORMALITY -Spike,right centro-parietal region -Breach artifact, right centro-parietal region -Continuous slow, generalized  IMPRESSION: This study is consistent with patient's history of epilepsy arising from centro-parietal region. Additionally there is evidence of cortical dysfunction in right centro-parietal region consistent with prior craniotomy and underlying stroke. Lastly there is generalized cerebral dysfunction (encephalopathy).  No seizures were seen throughout the recording.  [name]  DG CHEST PORT 1  VIEW Result Date: 11/09/2024 CLINICAL DATA:  Seizure. EXAM: PORTABLE CHEST 1 VIEW COMPARISON:  05/18/2024 FINDINGS: Lung volumes are low. Prominent heart size, stable allowing for differences in technique. Aortic tortuosity. Subsegmental atelectasis at the left lung base. No pulmonary edema, pleural effusion, or pneumothorax. Surgical clips in the right chest wall and right thoracic inlet. IMPRESSION: Low lung volumes with subsegmental atelectasis at the left lung base. Electronically Signed   By: [name]   On: 11/09/2024 17:03     PERTINENT LAB RESULTS: CBC: Recent Labs    11/09/24 1244 11/09/24 1303 11/10/24 0356  WBC 9.0  --  7.6  HGB 11.6* 12.6 11.0*  HCT 38.0 37.0 35.5*  PLT 282  --  227   CMET CMP     Component Value Date/Time   NA 136 11/11/2024 0806   K 3.7 11/11/2024 0806   CL 95 (L) 11/11/2024 0806   CO2 28 11/11/2024 0806   GLUCOSE 114 (H) 11/11/2024 0806   BUN 28 (H) 11/11/2024 0806   CREATININE 9.20 (H) 11/11/2024 0806   CALCIUM  7.8 (L) 11/11/2024 0806   PROT 7.3 11/09/2024 1244   ALBUMIN  2.8 (L) 11/11/2024 0806   AST 18 11/09/2024 1244   ALT 11 11/09/2024 1244   ALKPHOS 75 11/09/2024 1244   BILITOT 0.9 11/09/2024 1244   GFRNONAA 4 (L) 11/11/2024 0806    GFR Estimated Creatinine Clearance: 4.6 mL/min (A) (by C-G formula based on SCr of 9.2 mg/dL (H)). No results for input(s): LIPASE, AMYLASE in the last 72 hours. No results for input(s): CKTOTAL, CKMB, CKMBINDEX, TROPONINI in the last 72 hours. Invalid input(s): POCBNP No results for input(s): DDIMER in the last 72 hours. No results for input(s): HGBA1C in the last 72 hours. No results for input(s): CHOL, HDL, LDLCALC, TRIG, CHOLHDL, LDLDIRECT in the last 72 hours. Recent Labs    11/09/24 2224  TSH 4.248   No results for input(s): VITAMINB12, FOLATE, FERRITIN, TIBC, IRON, RETICCTPCT in the last 72 hours. Coags: Recent Labs    11/09/24 1244   INR 1.1   Microbiology: Recent Results (from the past 240 hours)  MRSA Next Gen by PCR, Nasal     Status: None   Collection Time: 11/09/24  8:00 PM   Specimen: Nasal Mucosa; Nasal Swab  Result Value Ref Range Status   MRSA by PCR Next Gen NOT DETECTED NOT DETECTED Final    Comment: (NOTE) The GeneXpert MRSA Assay (FDA approved for NASAL specimens only), is one component of a comprehensive MRSA colonization surveillance program. It is not intended to diagnose MRSA infection nor to guide or monitor treatment for MRSA infections. Test performance is not FDA approved in patients less than 72 years old. Performed at [facility], [address] [place], [place] [address]     FURTHER DISCHARGE INSTRUCTIONS:  Get Medicines reviewed and adjusted: Please take all your medications with you for your next visit with your Primary MD  Laboratory/radiological data: Please request your Primary MD to go over all  hospital tests and procedure/radiological results at the follow up, please ask your Primary MD to get all Hospital records sent to his/her office.  In some cases, they will be blood work, cultures and biopsy results pending at the time of your discharge. Please request that your primary care M.D. goes through all the records of your hospital data and follows up on these results.  Also Note the following: If you experience worsening of your admission symptoms, develop shortness of breath, life threatening emergency, suicidal or homicidal thoughts you must seek medical attention immediately by calling 911 or calling your MD immediately  if symptoms less severe.  You must read complete instructions/literature along with all the possible adverse reactions/side effects for all the Medicines you take and that have been prescribed to you. Take any new Medicines after you have completely understood and accpet all the possible adverse reactions/side effects.   Do not drive when taking Pain  medications or sleeping medications (Benzodaizepines)  Do not take more than prescribed Pain, Sleep and Anxiety Medications. It is not advisable to combine anxiety,sleep and pain medications without talking with your primary care practitioner  Special Instructions: If you have smoked or chewed Tobacco  in the last 2 yrs please stop smoking, stop any regular Alcohol   and or any Recreational drug use.  Wear Seat belts while driving.  Please note: You were cared for by a hospitalist during your hospital stay. Once you are discharged, your primary care physician will handle any further medical issues. Please note that NO REFILLS for any discharge medications will be authorized once you are discharged, as it is imperative that you return to your primary care physician (or establish a relationship with a primary care physician if you do not have one) for your post hospital discharge needs so that they can reassess your need for medications and monitor your lab values.  Total Time spent coordinating discharge including counseling, education and face to face time equals greater than 30 minutes.  SignedBETHA Donalda Applebaum 11/11/2024 12:40 PM

## 2024-11-11 NOTE — TOC Initial Note (Signed)
 Transition of Care [facility]) - Initial/Assessment Note    Patient Details  Name: Kirsten Mcmahon MRN: [id number] Date of Birth: [birth year]  [facility]) CM/SW Contact:    Almarie CHRISTELLA Goodie, LCSW Phone Number: 11/11/2024, 11:10 AM  Clinical Narrative:      CSW spoke with son, Darold, to discuss recommendation for SNF. Son reported that patient had been to SNF in the past and it did not go well, he was more interested in taking her home. Family already provides heavy care and 24/7 assistance for patient, she is wheelchair-bound at baseline. Patient has hospital bed at home, but not hoyer lift; son would be interested in having hoyer lift delivered. Son interested in [facility], no preference for agency. Referrals sent in the hub.  CSW discussed with son about barrier for patient's seizure medications. Per son, her medications are filled through her PCP, and he is not happy with her current PCP at [facility] due to the issues with getting her medications refilled. The patient has an appointment to get established with [facility] on 12/21, and son has already spoken with MD about receiving patient's seizure medication refill upon discharge. Son appreciative of CSW assistance, hopeful for patient to discharge home soon. Update sent to MD and RNCM.      Expected Discharge Plan: Home w Home Health Services Barriers to Discharge: Continued Medical Work up   Patient Goals and CMS Choice Patient states their goals for this hospitalization and ongoing recovery are:: patient unable to participate in goal setting, not fully oriented CMS Medicare.gov Compare Post Acute Care list provided to:: Patient Represenative (must comment) Choice offered to / list presented to : Adult Children      Expected Discharge Plan and Services     Post Acute Care Choice: Home Health Living arrangements for the past 2 months: Single Family Home                                      Prior  Living Arrangements/Services Living arrangements for the past 2 months: Single Family Home Lives with:: Adult Children Patient language and need for interpreter reviewed:: No Do you feel safe going back to the place where you live?: Yes      Need for Family Participation in Patient Care: Yes (Comment) Care giver support system in place?: Yes (comment) Current home services: DME Criminal Activity/Legal Involvement Pertinent to Current Situation/Hospitalization: No - Comment as needed  Activities of Daily Living      Permission Sought/Granted Permission sought to share information with : Family Supports Permission granted to share information with : Yes, Verbal Permission Granted  Share Information with NAME: Darold     Permission granted to share info w Relationship: Son     Emotional Assessment   Attitude/Demeanor/Rapport: Unable to Assess Affect (typically observed): Unable to Assess Orientation: : Oriented to Self   Psych Involvement: No (comment)  Admission diagnosis:  Seizure (HCC) [R56.9] Disorientation [R41.0] Acute encephalopathy [G93.40] Patient Active Problem List   Diagnosis Date Noted   Acute encephalopathy 11/09/2024   Diverticulitis 11/09/2024   Vascular dementia (HCC) 11/09/2024   Anemia of chronic disease 11/09/2024   Encephalopathy acute 02/07/2024   Acute cystitis without hematuria 02/07/2024   ABLA (acute blood loss anemia) 02/07/2024   Closed fracture of left distal humerus 02/07/2024   Cancer of right female breast (HCC) 12/08/2023   Seizures (HCC) 05/31/2023  History of breast cancer 05/31/2023   History of CVA (cerebrovascular accident) 05/31/2023   Delirium due to known physiological condition 08/03/2021   Seizure (HCC) 07/17/2021   Mixed diabetic hyperlipidemia associated with type 2 diabetes mellitus (HCC) 07/16/2021   Recurrent seizures (HCC) 07/15/2021   Encounter for immunization 07/14/2021   Pain, unspecified 07/13/2021   Hypokalemia  06/02/2021   Other pneumonia, unspecified organism 05/26/2021   Recent cerebrovascular accident (CVA) 05/17/2021   History of CVA with residual deficit 05/17/2021   Cerebral embolism with cerebral infarction 05/13/2021   Physical deconditioning 04/30/2021   Generalized muscle weakness 04/30/2021   Pressure injury of skin 04/29/2021   AKI (acute kidney injury) 04/28/2021   Moderate protein-calorie malnutrition 04/28/2021   Other bacterial infections of unspecified site 04/28/2021   Streptococcal sepsis, unspecified (HCC) 04/18/2021   Streptococcal bacteremia 04/18/2021   Leukocytosis 04/17/2021   Fever 04/17/2021   Allergy, unspecified, initial encounter 04/16/2021   Anaphylactic shock, unspecified, initial encounter 04/16/2021   Complication of vascular dialysis catheter 04/16/2021   Iron deficiency anemia, unspecified 04/16/2021   Pruritus, unspecified 04/16/2021   Secondary hyperparathyroidism of renal origin 04/16/2021   Type 2 diabetes mellitus with diabetic peripheral angiopathy without gangrene (HCC) 04/16/2021   End-stage renal disease on hemodialysis (HCC)    Acute kidney injury superimposed on CKD 04/09/2021   ARF (acute renal failure) 01/18/2021   Fall 01/17/2021   Essential hypertension    Hypothyroidism    Stroke (HCC)    GERD (gastroesophageal reflux disease)    Anemia in chronic kidney disease (CKD)    Acute metabolic encephalopathy    Acute renal failure superimposed on stage 3b chronic kidney disease (HCC)    Type 2 diabetes mellitus with ESRD (end-stage renal disease) (HCC)    Abnormal mammogram of left breast 07/30/2018   Closed displaced oblique fracture of shaft of left humerus 03/14/2018   Osteopenia 01/20/2018   Multiple thyroid  nodules 07/13/2017   Tracheal deviation 07/13/2017   Malignant neoplasm of upper-outer quadrant of right breast in female, estrogen receptor positive (HCC) 01/20/2017   Primary vulvar squamous cell carcinoma (HCC) 01/20/2017    PCP:  [employer] Pharmacy:   [id number] [place], [place] - [address] AT [facility] [address] [place] [address] Phone: [phone] Fax: [phone]  [facility] [address] [place] [address] Phone: [phone] Fax: [phone]     Social Drivers of Health (SDOH) Social History: SDOH Screenings   Food Insecurity: Patient Unable To Answer (11/10/2024)  Housing: Patient Unable To Answer (11/10/2024)  Transportation Needs: Patient Unable To Answer (11/10/2024)  Utilities: Patient Unable To Answer (11/10/2024)  Financial Resource Strain: Low Risk  (04/19/2024)   Received from [facility]  Physical Activity: Unknown (04/19/2024)   Received from [facility]  Social Connections: Patient Unable To Answer (11/10/2024)  Stress: No Stress Concern Present (04/19/2024)   Received from [facility]  Tobacco Use: Low Risk  (11/05/2024)   SDOH Interventions:     Readmission Risk Interventions     No data to display

## 2024-11-11 NOTE — TOC Transition Note (Addendum)
 Transition of Care [facility]) - Discharge Note   Patient Details  Name: Kirsten Mcmahon MRN: [id number] Date of Birth: 1946/02/24  [facility]) CM/SW Contact:  Marval Gell, RN Phone Number: 11/11/2024, 12:06 PM   Clinical Narrative:     Spoke with patient's son re DC plan. Verified home address and need for PTAR at DC. PTAR forms placed on chart, will set up once DC order placed.  [facility] able to accept for [facility], updated Hub and AVS.  Hoyer ordered through [place] to be delivered to the home, requested it to be delivered today.   13:27 Spoke w son, notified of DC and calling PTAR.  Nurse updated     Final next level of care: Home w Home Health Services Barriers to Discharge: No Barriers Identified   Patient Goals and CMS Choice Patient states their goals for this hospitalization and ongoing recovery are:: patient unable to participate in goal setting, not fully oriented CMS Medicare.gov Compare Post Acute Care list provided to:: Patient Represenative (must comment) Choice offered to / list presented to : Adult Children      Discharge Placement                       Discharge Plan and Services Additional resources added to the After Visit Summary for       Post Acute Care Choice: Home Health          DME Arranged: Other see comment Lajean) DME Agency: [employer] Date DME Agency Contacted: 11/11/24 Time DME Agency Contacted: 1205 Representative spoke with at DME Agency: [place] HH Arranged: PT, OT HH Agency: [facility] Date [facility] Agency Contacted: 11/11/24 Time HH Agency Contacted: 1206 Representative spoke with at [facility] Agency: [facility]  Social Drivers of Health (SDOH) Interventions SDOH Screenings   Food Insecurity: Patient Unable To Answer (11/10/2024)  Housing: Patient Unable To Answer (11/10/2024)  Transportation Needs: Patient Unable To Answer (11/10/2024)  Utilities: Patient Unable To Answer (11/10/2024)  Financial Resource Strain:  Low Risk  (04/19/2024)   Received from [facility]  Physical Activity: Unknown (04/19/2024)   Received from [facility]  Social Connections: Patient Unable To Answer (11/10/2024)  Stress: No Stress Concern Present (04/19/2024)   Received from [facility]  Tobacco Use: Low Risk  (11/05/2024)     Readmission Risk Interventions     No data to display

## 2024-11-11 NOTE — Discharge Planning (Signed)
 [facility] Patient Discharge Orders- [facility] CLINIC: [place]  Patient's name: Kirsten Mcmahon Admit/DC Dates: 11/09/2024 - 11/11/24  Discharge Diagnoses: Breakthrough Seizure 2/2 medication non compliance    HD ORDER CHANGES: Heparin  change: no  EDW Change: no Bath Change: no       ANEMIA MANAGEMENT: Aranesp : Given: no    PRBC's Given: no  ESA dose for discharge: no change IV Iron dose at discharge: no change   BONE/MINERAL MEDICATIONS: Hectorol/Calcitriol  change: no Sensipar /Parsabiv change: no   ACCESS INTERVENTION/CHANGE: no Details:   RECENT LABS: Recent Labs  Lab 11/11/24 0806  K 3.7  CALCIUM  7.8*  ALBUMIN  2.8*  PHOS 6.6*   Recent Labs  Lab 11/10/24 0356  HGB 11.0*      IV ANTIBIOTICS: none Details:   OTHER ANTICOAGULATION:  On Eliquis: no On Coumadin: no   OTHER/APPTS/LAB ORDERS:     D/C Meds to be reconciled by nurse after every discharge.  Completed By: Manuelita Labella PA-C   Reviewed by: MD:______ RN_______

## 2024-11-13 LAB — HEPATITIS B SURFACE ANTIBODY, QUANTITATIVE: Hep B S AB Quant (Post): 112 m[IU]/mL

## 2025-01-17 ENCOUNTER — Inpatient Hospital Stay: Payer: 59 | Admitting: Hematology and Oncology
